# Patient Record
Sex: Female | Born: 1970 | Race: Black or African American | Hispanic: No | Marital: Single | Smoking: Never smoker
Health system: Southern US, Community
[De-identification: ages and names within clinical notes are randomized; demographics above are authoritative.]

## PROBLEM LIST (undated history)

## (undated) ENCOUNTER — Emergency Department (HOSPITAL_COMMUNITY): Payer: Medicare Other | Source: Home / Self Care

## (undated) DIAGNOSIS — K219 Gastro-esophageal reflux disease without esophagitis: Secondary | ICD-10-CM

## (undated) DIAGNOSIS — Z992 Dependence on renal dialysis: Secondary | ICD-10-CM

## (undated) DIAGNOSIS — Z9483 Pancreas transplant status: Secondary | ICD-10-CM

## (undated) DIAGNOSIS — N2581 Secondary hyperparathyroidism of renal origin: Secondary | ICD-10-CM

## (undated) DIAGNOSIS — I10 Essential (primary) hypertension: Secondary | ICD-10-CM

## (undated) DIAGNOSIS — I5033 Acute on chronic diastolic (congestive) heart failure: Secondary | ICD-10-CM

## (undated) DIAGNOSIS — E669 Obesity, unspecified: Secondary | ICD-10-CM

## (undated) DIAGNOSIS — S0501XA Injury of conjunctiva and corneal abrasion without foreign body, right eye, initial encounter: Secondary | ICD-10-CM

## (undated) DIAGNOSIS — N186 End stage renal disease: Secondary | ICD-10-CM

## (undated) DIAGNOSIS — L0390 Cellulitis, unspecified: Secondary | ICD-10-CM

## (undated) DIAGNOSIS — J439 Emphysema, unspecified: Secondary | ICD-10-CM

## (undated) DIAGNOSIS — F329 Major depressive disorder, single episode, unspecified: Secondary | ICD-10-CM

## (undated) DIAGNOSIS — E1110 Type 2 diabetes mellitus with ketoacidosis without coma: Secondary | ICD-10-CM

## (undated) DIAGNOSIS — D649 Anemia, unspecified: Secondary | ICD-10-CM

## (undated) DIAGNOSIS — Z5189 Encounter for other specified aftercare: Secondary | ICD-10-CM

## (undated) DIAGNOSIS — I209 Angina pectoris, unspecified: Secondary | ICD-10-CM

## (undated) DIAGNOSIS — Z940 Kidney transplant status: Secondary | ICD-10-CM

## (undated) DIAGNOSIS — J45909 Unspecified asthma, uncomplicated: Secondary | ICD-10-CM

## (undated) DIAGNOSIS — E109 Type 1 diabetes mellitus without complications: Secondary | ICD-10-CM

## (undated) DIAGNOSIS — F32A Depression, unspecified: Secondary | ICD-10-CM

## (undated) DIAGNOSIS — G2581 Restless legs syndrome: Secondary | ICD-10-CM

## (undated) DIAGNOSIS — E785 Hyperlipidemia, unspecified: Secondary | ICD-10-CM

## (undated) DIAGNOSIS — E611 Iron deficiency: Secondary | ICD-10-CM

## (undated) DIAGNOSIS — I639 Cerebral infarction, unspecified: Secondary | ICD-10-CM

## (undated) DIAGNOSIS — J40 Bronchitis, not specified as acute or chronic: Secondary | ICD-10-CM

## (undated) DIAGNOSIS — G43909 Migraine, unspecified, not intractable, without status migrainosus: Secondary | ICD-10-CM

## (undated) DIAGNOSIS — J189 Pneumonia, unspecified organism: Secondary | ICD-10-CM

## (undated) DIAGNOSIS — R109 Unspecified abdominal pain: Secondary | ICD-10-CM

## (undated) HISTORY — DX: Cellulitis, unspecified: L03.90

## (undated) HISTORY — DX: Hyperlipidemia, unspecified: E78.5

## (undated) HISTORY — DX: Gastro-esophageal reflux disease without esophagitis: K21.9

## (undated) HISTORY — DX: Injury of conjunctiva and corneal abrasion without foreign body, right eye, initial encounter: S05.01XA

## (undated) HISTORY — DX: Obesity, unspecified: E66.9

## (undated) HISTORY — DX: Emphysema, unspecified: J43.9

## (undated) HISTORY — PX: EYE SURGERY: SHX253

## (undated) HISTORY — DX: End stage renal disease: N18.6

## (undated) HISTORY — PX: RETINOPATHY SURGERY: SHX765

## (undated) HISTORY — PX: OTHER SURGICAL HISTORY: SHX169

## (undated) HISTORY — DX: Iron deficiency: E61.1

## (undated) HISTORY — DX: Secondary hyperparathyroidism of renal origin: N25.81

## (undated) HISTORY — DX: Restless legs syndrome: G25.81

---

## 1993-05-28 HISTORY — PX: CHOLECYSTECTOMY: SHX55

## 2000-07-30 ENCOUNTER — Ambulatory Visit (HOSPITAL_COMMUNITY): Admission: RE | Admit: 2000-07-30 | Discharge: 2000-07-31 | Payer: Self-pay | Admitting: Ophthalmology

## 2000-07-30 ENCOUNTER — Encounter: Payer: Self-pay | Admitting: Ophthalmology

## 2000-08-01 ENCOUNTER — Emergency Department (HOSPITAL_COMMUNITY): Admission: EM | Admit: 2000-08-01 | Discharge: 2000-08-02 | Payer: Self-pay | Admitting: Emergency Medicine

## 2000-08-13 ENCOUNTER — Encounter: Payer: Self-pay | Admitting: Nephrology

## 2000-08-13 ENCOUNTER — Encounter: Admission: RE | Admit: 2000-08-13 | Discharge: 2000-08-13 | Payer: Self-pay | Admitting: Nephrology

## 2001-01-13 ENCOUNTER — Emergency Department (HOSPITAL_COMMUNITY): Admission: EM | Admit: 2001-01-13 | Discharge: 2001-01-14 | Payer: Self-pay | Admitting: Emergency Medicine

## 2001-08-19 ENCOUNTER — Encounter: Admission: RE | Admit: 2001-08-19 | Discharge: 2001-11-17 | Payer: Self-pay | Admitting: Nephrology

## 2001-10-27 ENCOUNTER — Emergency Department (HOSPITAL_COMMUNITY): Admission: EM | Admit: 2001-10-27 | Discharge: 2001-10-27 | Payer: Self-pay | Admitting: Emergency Medicine

## 2001-11-25 ENCOUNTER — Encounter (HOSPITAL_COMMUNITY): Admission: RE | Admit: 2001-11-25 | Discharge: 2002-02-23 | Payer: Self-pay | Admitting: Nephrology

## 2001-12-20 ENCOUNTER — Emergency Department (HOSPITAL_COMMUNITY): Admission: EM | Admit: 2001-12-20 | Discharge: 2001-12-20 | Payer: Self-pay | Admitting: Emergency Medicine

## 2001-12-20 ENCOUNTER — Encounter: Payer: Self-pay | Admitting: Emergency Medicine

## 2002-01-11 ENCOUNTER — Emergency Department (HOSPITAL_COMMUNITY): Admission: EM | Admit: 2002-01-11 | Discharge: 2002-01-11 | Payer: Self-pay | Admitting: Emergency Medicine

## 2002-01-19 ENCOUNTER — Emergency Department (HOSPITAL_COMMUNITY): Admission: EM | Admit: 2002-01-19 | Discharge: 2002-01-20 | Payer: Self-pay | Admitting: Emergency Medicine

## 2002-03-18 ENCOUNTER — Encounter (HOSPITAL_COMMUNITY): Admission: RE | Admit: 2002-03-18 | Discharge: 2002-06-16 | Payer: Self-pay | Admitting: Nephrology

## 2002-04-05 ENCOUNTER — Emergency Department (HOSPITAL_COMMUNITY): Admission: EM | Admit: 2002-04-05 | Discharge: 2002-04-05 | Payer: Self-pay | Admitting: Emergency Medicine

## 2002-05-28 DIAGNOSIS — IMO0001 Reserved for inherently not codable concepts without codable children: Secondary | ICD-10-CM

## 2002-05-28 DIAGNOSIS — Z5189 Encounter for other specified aftercare: Secondary | ICD-10-CM

## 2002-05-28 HISTORY — DX: Reserved for inherently not codable concepts without codable children: IMO0001

## 2002-05-28 HISTORY — DX: Encounter for other specified aftercare: Z51.89

## 2002-08-16 HISTORY — PX: COMBINED KIDNEY-PANCREAS TRANSPLANT: SHX1382

## 2002-10-20 ENCOUNTER — Encounter: Admission: RE | Admit: 2002-10-20 | Discharge: 2002-11-10 | Payer: Self-pay | Admitting: Transplant Surgery

## 2002-12-01 ENCOUNTER — Encounter: Admission: RE | Admit: 2002-12-01 | Discharge: 2003-01-01 | Payer: Self-pay | Admitting: Transplant Surgery

## 2003-03-08 ENCOUNTER — Emergency Department (HOSPITAL_COMMUNITY): Admission: EM | Admit: 2003-03-08 | Discharge: 2003-03-08 | Payer: Self-pay | Admitting: Emergency Medicine

## 2003-06-02 ENCOUNTER — Emergency Department (HOSPITAL_COMMUNITY): Admission: AD | Admit: 2003-06-02 | Discharge: 2003-06-02 | Payer: Self-pay | Admitting: Family Medicine

## 2003-06-30 ENCOUNTER — Emergency Department (HOSPITAL_COMMUNITY): Admission: AD | Admit: 2003-06-30 | Discharge: 2003-06-30 | Payer: Self-pay | Admitting: Family Medicine

## 2003-09-01 ENCOUNTER — Emergency Department (HOSPITAL_COMMUNITY): Admission: EM | Admit: 2003-09-01 | Discharge: 2003-09-01 | Payer: Self-pay | Admitting: Family Medicine

## 2003-09-14 ENCOUNTER — Emergency Department (HOSPITAL_COMMUNITY): Admission: EM | Admit: 2003-09-14 | Discharge: 2003-09-14 | Payer: Self-pay | Admitting: Family Medicine

## 2003-10-06 ENCOUNTER — Emergency Department (HOSPITAL_COMMUNITY): Admission: EM | Admit: 2003-10-06 | Discharge: 2003-10-06 | Payer: Self-pay | Admitting: Family Medicine

## 2003-11-28 ENCOUNTER — Emergency Department (HOSPITAL_COMMUNITY): Admission: EM | Admit: 2003-11-28 | Discharge: 2003-11-28 | Payer: Self-pay | Admitting: Family Medicine

## 2003-11-30 ENCOUNTER — Emergency Department (HOSPITAL_COMMUNITY): Admission: EM | Admit: 2003-11-30 | Discharge: 2003-11-30 | Payer: Self-pay | Admitting: Emergency Medicine

## 2004-01-20 ENCOUNTER — Encounter: Admission: RE | Admit: 2004-01-20 | Discharge: 2004-01-20 | Payer: Self-pay | Admitting: Nephrology

## 2004-03-22 ENCOUNTER — Emergency Department (HOSPITAL_COMMUNITY): Admission: EM | Admit: 2004-03-22 | Discharge: 2004-03-22 | Payer: Self-pay | Admitting: Family Medicine

## 2004-08-23 ENCOUNTER — Emergency Department (HOSPITAL_COMMUNITY): Admission: EM | Admit: 2004-08-23 | Discharge: 2004-08-23 | Payer: Self-pay | Admitting: Family Medicine

## 2004-08-24 ENCOUNTER — Emergency Department (HOSPITAL_COMMUNITY): Admission: EM | Admit: 2004-08-24 | Discharge: 2004-08-25 | Payer: Self-pay | Admitting: Emergency Medicine

## 2004-12-05 ENCOUNTER — Emergency Department (HOSPITAL_COMMUNITY): Admission: EM | Admit: 2004-12-05 | Discharge: 2004-12-05 | Payer: Self-pay | Admitting: Family Medicine

## 2004-12-20 ENCOUNTER — Emergency Department (HOSPITAL_COMMUNITY): Admission: EM | Admit: 2004-12-20 | Discharge: 2004-12-20 | Payer: Self-pay | Admitting: Family Medicine

## 2005-04-09 ENCOUNTER — Emergency Department (HOSPITAL_COMMUNITY): Admission: EM | Admit: 2005-04-09 | Discharge: 2005-04-09 | Payer: Self-pay | Admitting: Family Medicine

## 2005-05-16 ENCOUNTER — Emergency Department (HOSPITAL_COMMUNITY): Admission: EM | Admit: 2005-05-16 | Discharge: 2005-05-16 | Payer: Self-pay | Admitting: Emergency Medicine

## 2005-06-05 ENCOUNTER — Emergency Department (HOSPITAL_COMMUNITY): Admission: EM | Admit: 2005-06-05 | Discharge: 2005-06-05 | Payer: Self-pay | Admitting: Emergency Medicine

## 2005-06-14 ENCOUNTER — Inpatient Hospital Stay (HOSPITAL_COMMUNITY): Admission: AD | Admit: 2005-06-14 | Discharge: 2005-06-15 | Payer: Self-pay | Admitting: Nephrology

## 2005-07-25 ENCOUNTER — Emergency Department (HOSPITAL_COMMUNITY): Admission: EM | Admit: 2005-07-25 | Discharge: 2005-07-25 | Payer: Self-pay | Admitting: Emergency Medicine

## 2005-07-25 ENCOUNTER — Emergency Department (HOSPITAL_COMMUNITY): Admission: EM | Admit: 2005-07-25 | Discharge: 2005-07-25 | Payer: Self-pay | Admitting: *Deleted

## 2006-01-18 ENCOUNTER — Encounter: Admission: RE | Admit: 2006-01-18 | Discharge: 2006-01-18 | Payer: Self-pay | Admitting: Obstetrics

## 2006-02-10 IMAGING — CR DG LUMBAR SPINE COMPLETE 4+V
5 series · 5 of 5 positions shown · non-contrast
Comparison: none

CLINICAL DATA: 33 year old with back pain.
 FIVE VIEW LUMBAR SPINE SERIES
 The lateral film demonstrates normal overall alignment.  Disk spaces and vertebral bodies are maintained.  No acute bony findings.  Oblique films demonstrate no significant facet degenerative changes and no pars defects.  
 IMPRESSION
 1.  No acute bony findings or significant degenerative changes in the lumbar spine.

[view not recorded (1 of 5)]
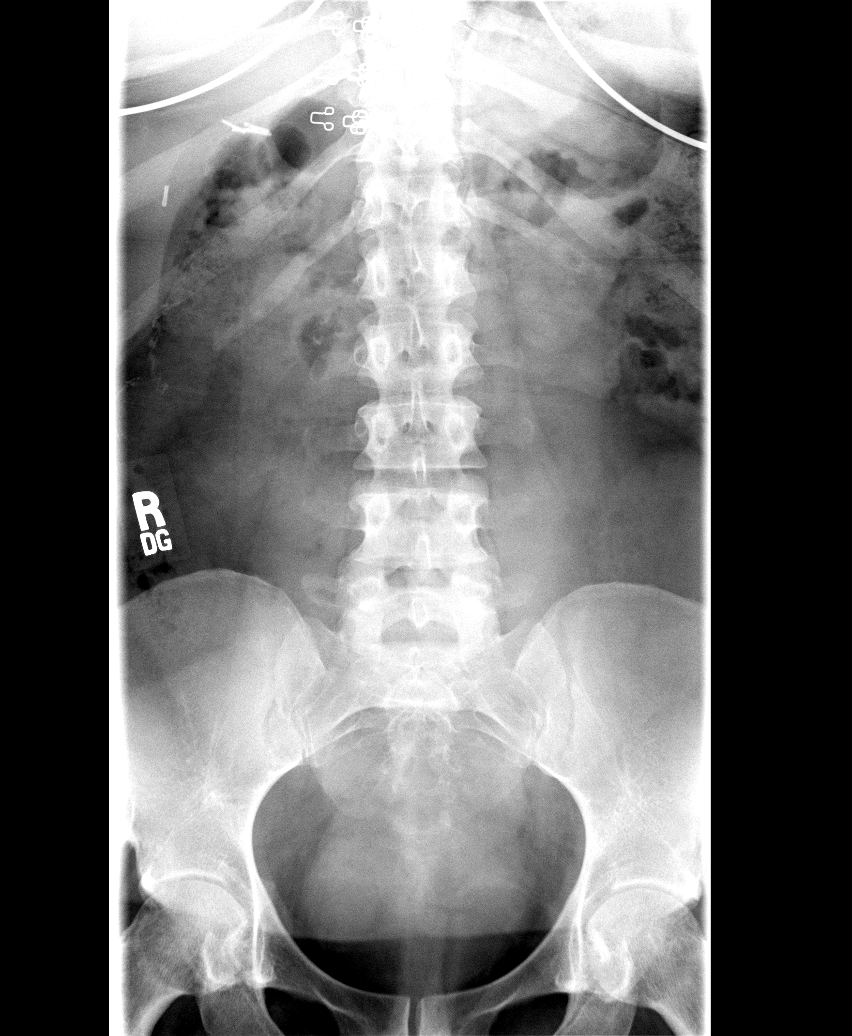

[view not recorded (2 of 5)]
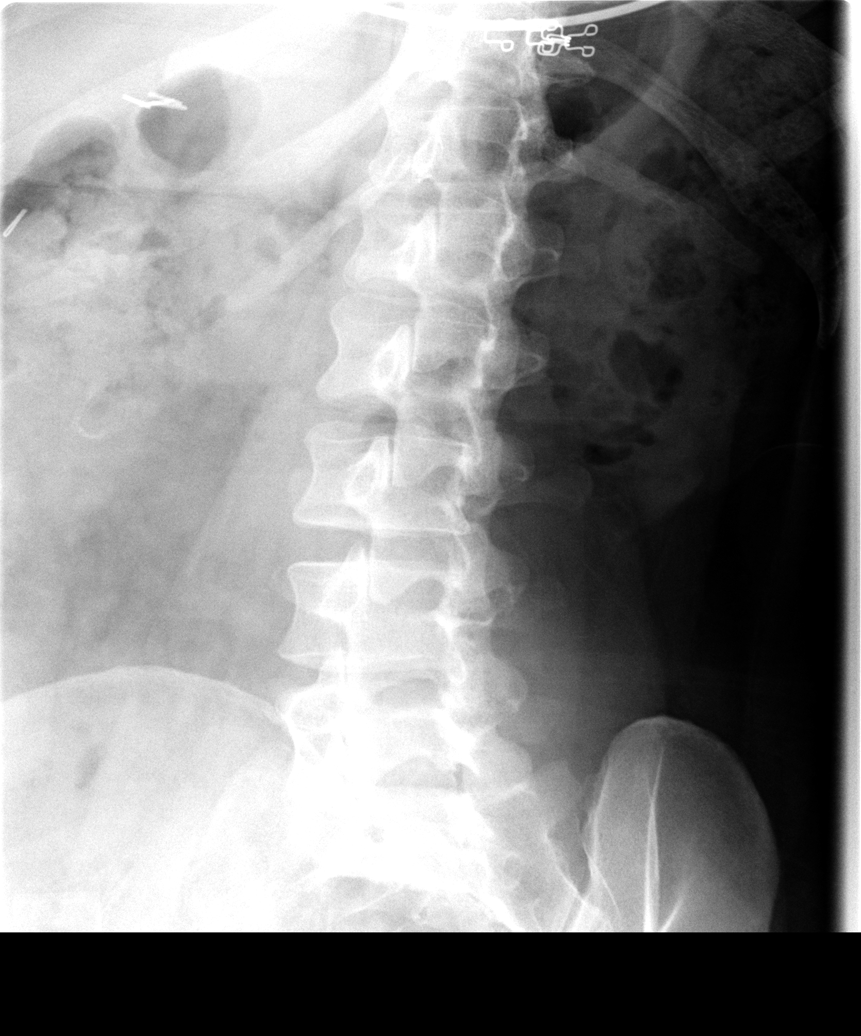

[view not recorded (3 of 5)]
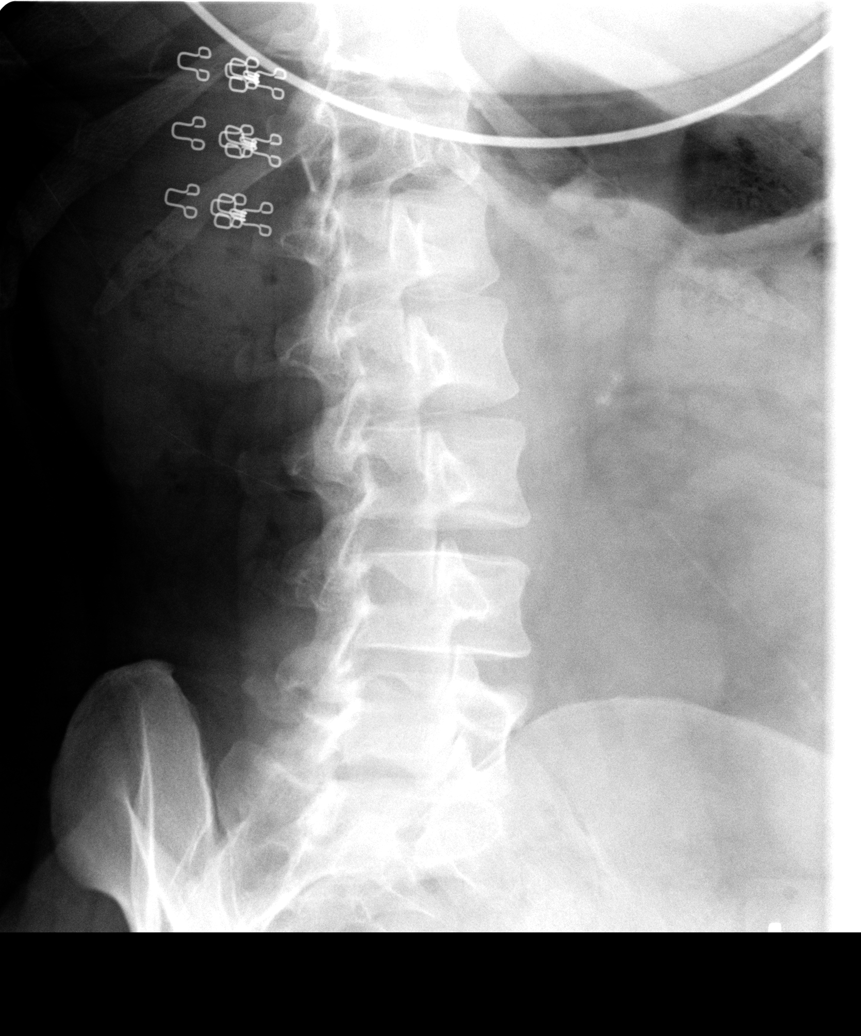

[view not recorded (4 of 5)]
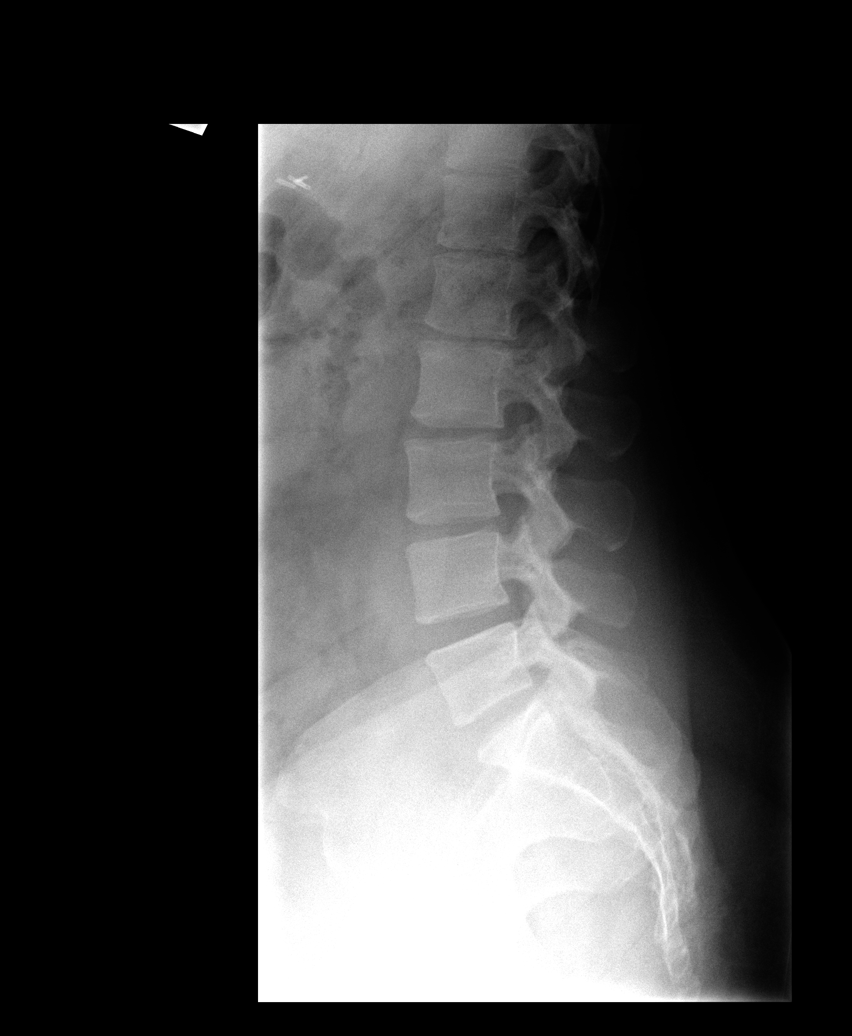

[view not recorded (5 of 5)]
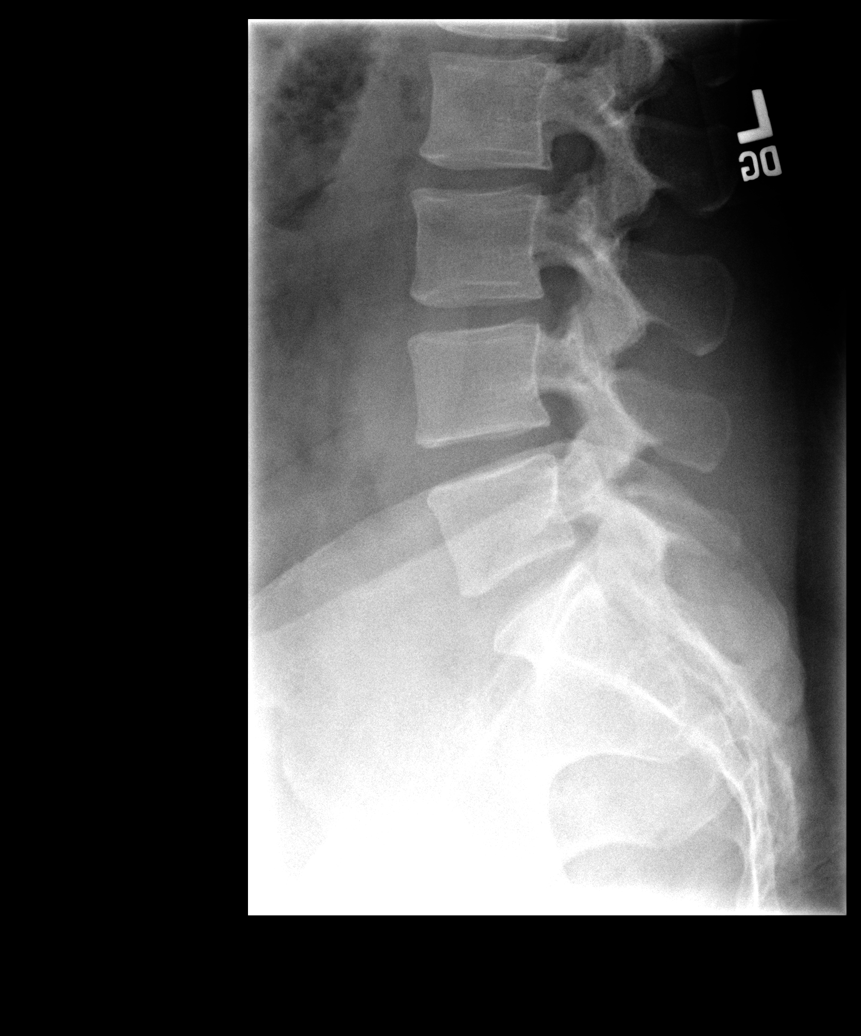

[5 of 5 positions shown; findings below may reference images not displayed]

## 2006-07-16 ENCOUNTER — Encounter: Admission: RE | Admit: 2006-07-16 | Discharge: 2006-07-16 | Payer: Self-pay | Admitting: Obstetrics

## 2006-08-19 ENCOUNTER — Emergency Department (HOSPITAL_COMMUNITY): Admission: EM | Admit: 2006-08-19 | Discharge: 2006-08-19 | Payer: Self-pay | Admitting: Family Medicine

## 2006-12-26 ENCOUNTER — Ambulatory Visit (HOSPITAL_COMMUNITY): Admission: RE | Admit: 2006-12-26 | Discharge: 2006-12-26 | Payer: Self-pay | Admitting: Nephrology

## 2006-12-31 ENCOUNTER — Ambulatory Visit: Payer: Self-pay | Admitting: Vascular Surgery

## 2007-01-16 ENCOUNTER — Ambulatory Visit (HOSPITAL_COMMUNITY): Admission: RE | Admit: 2007-01-16 | Discharge: 2007-01-16 | Payer: Self-pay | Admitting: Vascular Surgery

## 2007-01-16 ENCOUNTER — Ambulatory Visit: Payer: Self-pay | Admitting: Vascular Surgery

## 2007-03-04 ENCOUNTER — Ambulatory Visit (HOSPITAL_COMMUNITY): Admission: RE | Admit: 2007-03-04 | Discharge: 2007-03-04 | Payer: Self-pay | Admitting: Vascular Surgery

## 2007-03-04 ENCOUNTER — Ambulatory Visit: Payer: Self-pay | Admitting: Surgery

## 2007-06-13 ENCOUNTER — Emergency Department (HOSPITAL_COMMUNITY): Admission: EM | Admit: 2007-06-13 | Discharge: 2007-06-13 | Payer: Self-pay | Admitting: Emergency Medicine

## 2007-07-09 ENCOUNTER — Emergency Department (HOSPITAL_COMMUNITY): Admission: EM | Admit: 2007-07-09 | Discharge: 2007-07-09 | Payer: Self-pay | Admitting: Emergency Medicine

## 2007-07-29 ENCOUNTER — Emergency Department (HOSPITAL_COMMUNITY): Admission: EM | Admit: 2007-07-29 | Discharge: 2007-07-29 | Payer: Self-pay | Admitting: Emergency Medicine
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
## 2008-03-18 ENCOUNTER — Inpatient Hospital Stay (HOSPITAL_COMMUNITY): Admission: EM | Admit: 2008-03-18 | Discharge: 2008-03-20 | Payer: Self-pay | Admitting: Emergency Medicine

## 2008-05-09 ENCOUNTER — Emergency Department (HOSPITAL_COMMUNITY): Admission: EM | Admit: 2008-05-09 | Discharge: 2008-05-09 | Payer: Self-pay | Admitting: Family Medicine

## 2008-05-17 ENCOUNTER — Inpatient Hospital Stay (HOSPITAL_COMMUNITY): Admission: EM | Admit: 2008-05-17 | Discharge: 2008-05-19 | Payer: Self-pay | Admitting: Emergency Medicine

## 2008-06-22 ENCOUNTER — Emergency Department (HOSPITAL_COMMUNITY): Admission: EM | Admit: 2008-06-22 | Discharge: 2008-06-22 | Payer: Self-pay | Admitting: Emergency Medicine

## 2008-07-16 ENCOUNTER — Ambulatory Visit: Payer: Self-pay | Admitting: Emergency Medicine

## 2008-07-16 ENCOUNTER — Inpatient Hospital Stay (HOSPITAL_COMMUNITY): Admission: EM | Admit: 2008-07-16 | Discharge: 2008-07-23 | Payer: Self-pay | Admitting: Emergency Medicine

## 2008-09-07 ENCOUNTER — Ambulatory Visit: Payer: Self-pay | Admitting: Vascular Surgery

## 2008-09-10 ENCOUNTER — Ambulatory Visit (HOSPITAL_COMMUNITY): Admission: RE | Admit: 2008-09-10 | Discharge: 2008-09-10 | Payer: Self-pay | Admitting: Vascular Surgery

## 2008-09-10 ENCOUNTER — Ambulatory Visit: Payer: Self-pay | Admitting: Vascular Surgery

## 2008-10-14 ENCOUNTER — Ambulatory Visit: Payer: Self-pay | Admitting: *Deleted

## 2008-10-25 ENCOUNTER — Emergency Department (HOSPITAL_COMMUNITY): Admission: EM | Admit: 2008-10-25 | Discharge: 2008-10-25 | Payer: Self-pay | Admitting: Emergency Medicine

## 2008-11-15 ENCOUNTER — Encounter: Payer: Self-pay | Admitting: Pulmonary Disease

## 2008-11-23 ENCOUNTER — Ambulatory Visit: Payer: Self-pay | Admitting: Vascular Surgery

## 2008-11-30 ENCOUNTER — Emergency Department (HOSPITAL_COMMUNITY): Admission: EM | Admit: 2008-11-30 | Discharge: 2008-11-30 | Payer: Self-pay | Admitting: Emergency Medicine

## 2008-12-01 ENCOUNTER — Emergency Department (HOSPITAL_COMMUNITY): Admission: EM | Admit: 2008-12-01 | Discharge: 2008-12-01 | Payer: Self-pay | Admitting: Emergency Medicine

## 2008-12-10 ENCOUNTER — Ambulatory Visit: Payer: Self-pay | Admitting: Vascular Surgery

## 2008-12-10 ENCOUNTER — Ambulatory Visit (HOSPITAL_COMMUNITY): Admission: RE | Admit: 2008-12-10 | Discharge: 2008-12-10 | Payer: Self-pay | Admitting: Vascular Surgery

## 2008-12-17 ENCOUNTER — Ambulatory Visit (HOSPITAL_COMMUNITY): Admission: RE | Admit: 2008-12-17 | Discharge: 2008-12-17 | Payer: Self-pay | Admitting: Nephrology

## 2008-12-29 ENCOUNTER — Ambulatory Visit (HOSPITAL_COMMUNITY): Admission: RE | Admit: 2008-12-29 | Discharge: 2008-12-29 | Payer: Self-pay | Admitting: Vascular Surgery

## 2009-01-06 ENCOUNTER — Ambulatory Visit (HOSPITAL_COMMUNITY): Admission: RE | Admit: 2009-01-06 | Discharge: 2009-01-06 | Payer: Self-pay | Admitting: Surgery

## 2009-01-12 ENCOUNTER — Ambulatory Visit (HOSPITAL_COMMUNITY): Admission: RE | Admit: 2009-01-12 | Discharge: 2009-01-12 | Payer: Self-pay | Admitting: Vascular Surgery

## 2009-01-12 ENCOUNTER — Ambulatory Visit: Payer: Self-pay | Admitting: Vascular Surgery

## 2009-01-16 IMAGING — XA IR FLUORO GUIDE CV LINE*L*
1 series · 2 of 2 positions shown · non-contrast
Comparison: none

CLINICAL DATA: 36 year old female, bacteremia, existing left dialysis catheter, end-stage renal disease, chronic catheter use.
1.  REMOVAL OF LEFT IJ DIALYSIS CATHETER:
2.  ULTRASOUND GUIDANCE FOR VASCULAR ACCESS:
3.  RIGHT IJ TUNNELED HEMODIALYSIS CATHETER INSERTION:
Radiologist:  Gone, Dax.   
Guidance:  Ultrasound and fluoroscopic. 
Complications:  No immediate complications.   
Medications: 1 gm Ancef for intravenous antibiotics, Versed and fentanyl for conscious sedation.
Procedure/Findings:   Informed consent was obtained from the patient following explanation of the procedure, risks, benefits, and alternatives.  The patient understands, agrees, and consents to the procedure. All questions were addressed. 
LEFT IJ TUNNELED DIALYSIS CATHETER REMOVAL:  Under sterile conditions and local anesthesia, the existing catheter was blunted dissected from the tunnel tract to release the fibrous retension cuff.  The catheter was removed in its entirety.  Hemostasis was obtained with compression.  The catheter tip was sent for culture.  The patient tolerated the procedure well.   There were no immediate complications. A dry sterile dressing was applied.

[Series 1: run · 2 of 2 slices shown]
[im 1/2]
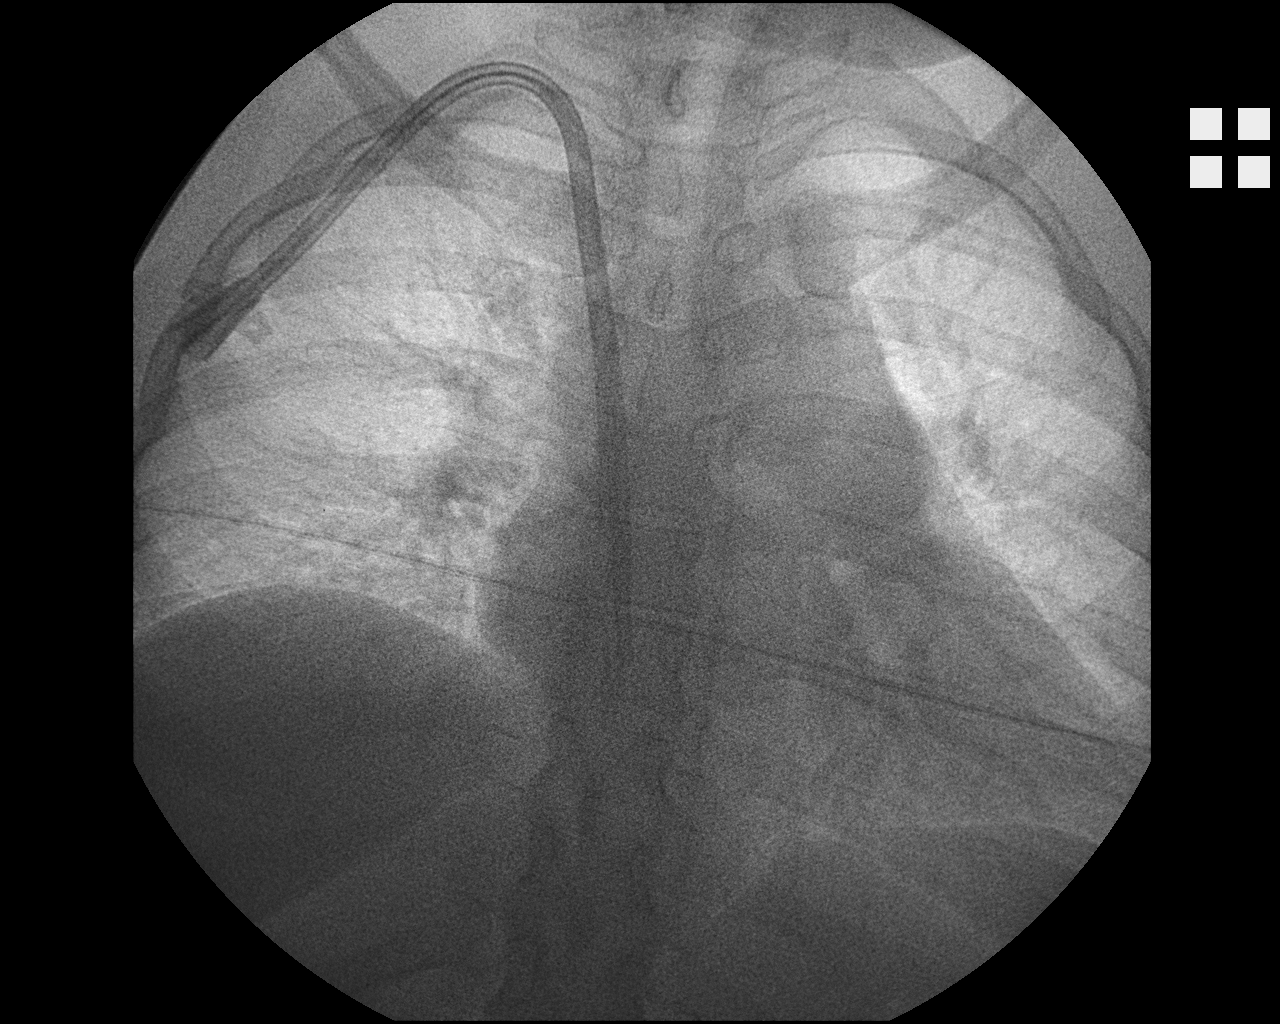
[im 2/2]
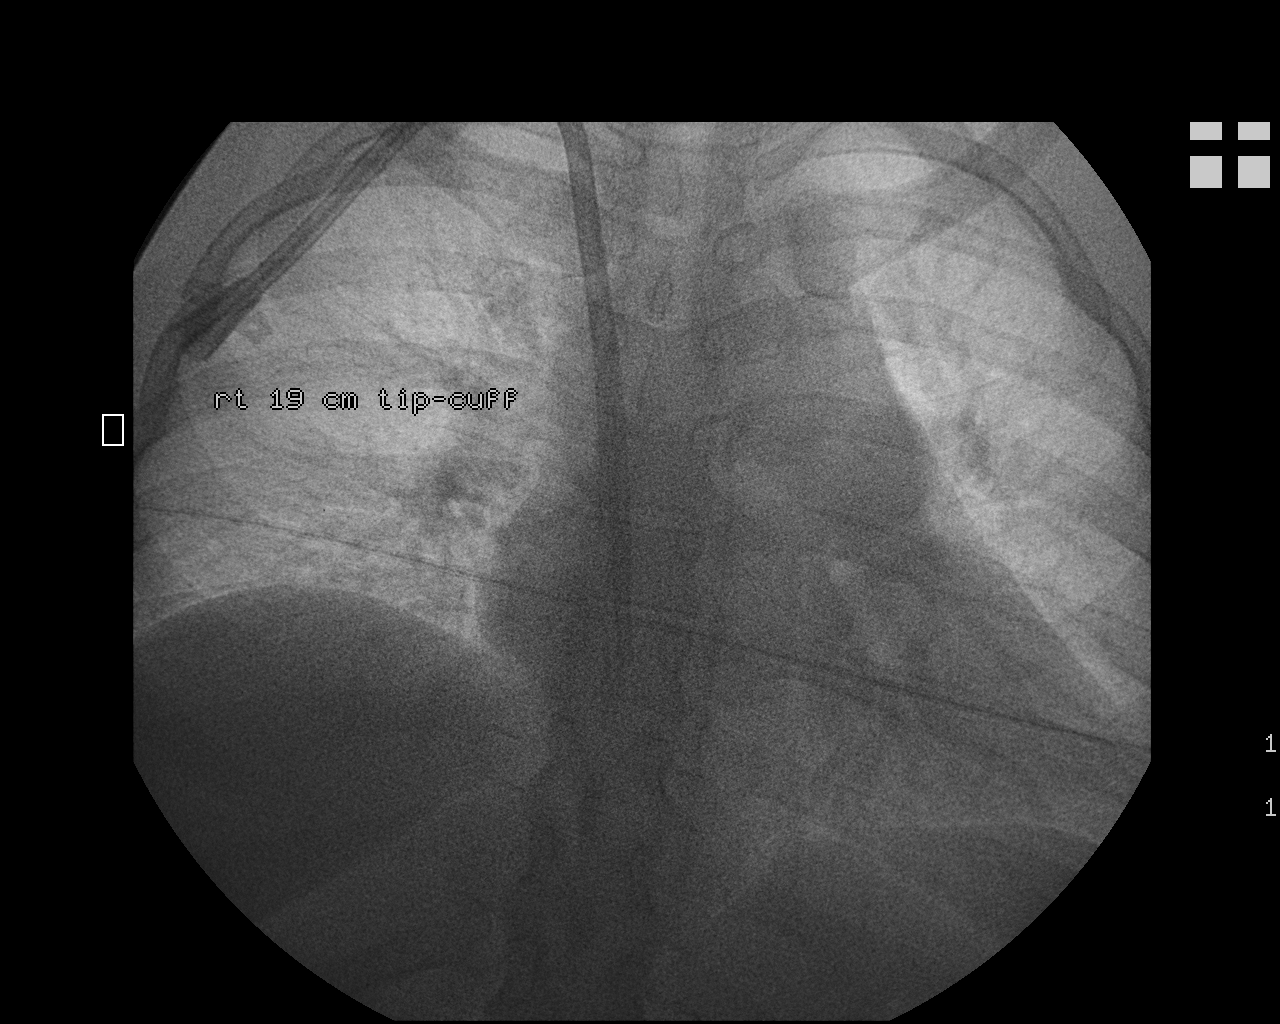

[2 of 2 positions shown; findings below may reference images not displayed]

IMPRESSION: Uncomplicated removal of a left IJ tunneled hemodialysis catheter.
ULTRASOUND GUIDANCE FOR VASCULAR ACCESS AND RIGHT IJ TUNNELED DIALYSIS CATHETER INSERTION:  Under sterile conditions and local anesthesia, right IJ micropuncture venous access was performed.  This was done with ultrasound guidance.  Images were obtained for documentation.  An 018 guidewire was advanced centrally followed by a 4 French dilator. This allowed insertion of a Bentson guidewire into the IVC through a 5 French MPA catheter.  Measurements were obtained from the SVC-RA junction to the right IJ venotomy site.  In the right infraclavicular chest a subcutaneous tunnel was created. This was done under sterile conditions and local anesthesia.  The catheter was tunneled subcutaneously across the chest to the venotomy site and inserted through a valved Peel-Away sheath into the SVC-RA junction.  Images were obtained with fluoroscopy for catheter position.  The tips are in the SVC-RA junction.  The catheter aspirated blood very well followed by saline and heparin flushes.  Caps were applied.  The catheter was secured to the skin with a 0 Prolene suture.   The venotomy site was closed with subcuticular 4-0 Vicryl suture and Dermabond.  A dry sterile dressing was applied.
IMPRESSION: Ultrasound and fluoroscopically guided right IJ tunneled hemodialysis catheter insertion with the tips in the SVC-RA junction ready for use (19 cm tip-to-cuff HemoSplit catheter).

## 2009-01-28 ENCOUNTER — Ambulatory Visit: Payer: Self-pay | Admitting: Vascular Surgery

## 2009-02-18 ENCOUNTER — Emergency Department (HOSPITAL_COMMUNITY): Admission: EM | Admit: 2009-02-18 | Discharge: 2009-02-18 | Payer: Self-pay | Admitting: Family Medicine

## 2009-03-11 ENCOUNTER — Ambulatory Visit (HOSPITAL_COMMUNITY): Admission: RE | Admit: 2009-03-11 | Discharge: 2009-03-11 | Payer: Self-pay | Admitting: Vascular Surgery

## 2009-03-11 ENCOUNTER — Ambulatory Visit: Payer: Self-pay | Admitting: Vascular Surgery

## 2009-03-24 ENCOUNTER — Emergency Department (HOSPITAL_COMMUNITY): Admission: EM | Admit: 2009-03-24 | Discharge: 2009-03-24 | Payer: Self-pay | Admitting: Emergency Medicine

## 2009-04-11 ENCOUNTER — Encounter (INDEPENDENT_AMBULATORY_CARE_PROVIDER_SITE_OTHER): Payer: Self-pay | Admitting: *Deleted

## 2009-04-25 ENCOUNTER — Telehealth (INDEPENDENT_AMBULATORY_CARE_PROVIDER_SITE_OTHER): Payer: Self-pay | Admitting: *Deleted

## 2009-04-28 ENCOUNTER — Ambulatory Visit: Payer: Self-pay | Admitting: Vascular Surgery

## 2009-05-04 ENCOUNTER — Ambulatory Visit (HOSPITAL_COMMUNITY): Admission: RE | Admit: 2009-05-04 | Discharge: 2009-05-04 | Payer: Self-pay | Admitting: Vascular Surgery

## 2009-05-05 ENCOUNTER — Emergency Department (HOSPITAL_COMMUNITY): Admission: EM | Admit: 2009-05-05 | Discharge: 2009-05-05 | Payer: Self-pay | Admitting: Emergency Medicine

## 2009-05-09 ENCOUNTER — Ambulatory Visit (HOSPITAL_COMMUNITY): Admission: RE | Admit: 2009-05-09 | Discharge: 2009-05-09 | Payer: Self-pay | Admitting: Vascular Surgery

## 2009-05-09 ENCOUNTER — Ambulatory Visit: Payer: Self-pay | Admitting: Vascular Surgery

## 2009-05-28 LAB — HM MAMMOGRAPHY

## 2009-07-13 ENCOUNTER — Encounter: Admission: RE | Admit: 2009-07-13 | Discharge: 2009-07-13 | Payer: Self-pay | Admitting: Nephrology

## 2009-07-20 ENCOUNTER — Ambulatory Visit (HOSPITAL_COMMUNITY): Admission: RE | Admit: 2009-07-20 | Discharge: 2009-07-20 | Payer: Self-pay | Admitting: Nephrology

## 2009-07-28 ENCOUNTER — Ambulatory Visit: Payer: Self-pay | Admitting: Vascular Surgery

## 2009-08-03 ENCOUNTER — Ambulatory Visit (HOSPITAL_COMMUNITY): Admission: RE | Admit: 2009-08-03 | Discharge: 2009-08-03 | Payer: Self-pay | Admitting: Vascular Surgery

## 2009-08-03 ENCOUNTER — Ambulatory Visit: Payer: Self-pay | Admitting: Vascular Surgery

## 2009-09-14 ENCOUNTER — Emergency Department (HOSPITAL_COMMUNITY): Admission: EM | Admit: 2009-09-14 | Discharge: 2009-09-14 | Payer: Self-pay | Admitting: Family Medicine

## 2009-09-29 ENCOUNTER — Emergency Department (HOSPITAL_COMMUNITY): Admission: EM | Admit: 2009-09-29 | Discharge: 2009-09-29 | Payer: Self-pay | Admitting: Family Medicine

## 2009-09-30 ENCOUNTER — Ambulatory Visit: Payer: Self-pay | Admitting: Surgery

## 2009-10-03 ENCOUNTER — Emergency Department (HOSPITAL_COMMUNITY): Admission: EM | Admit: 2009-10-03 | Discharge: 2009-10-03 | Payer: Self-pay | Admitting: Emergency Medicine

## 2009-10-20 ENCOUNTER — Emergency Department (HOSPITAL_COMMUNITY): Admission: EM | Admit: 2009-10-20 | Discharge: 2009-10-21 | Payer: Self-pay | Admitting: Emergency Medicine

## 2009-10-27 ENCOUNTER — Ambulatory Visit: Payer: Self-pay | Admitting: Vascular Surgery

## 2009-12-08 ENCOUNTER — Ambulatory Visit: Payer: Self-pay | Admitting: Vascular Surgery

## 2009-12-17 ENCOUNTER — Emergency Department (HOSPITAL_COMMUNITY): Admission: EM | Admit: 2009-12-17 | Discharge: 2009-12-17 | Payer: Self-pay | Admitting: Family Medicine

## 2009-12-22 ENCOUNTER — Ambulatory Visit: Payer: Self-pay | Admitting: Vascular Surgery

## 2009-12-30 ENCOUNTER — Ambulatory Visit: Payer: Self-pay | Admitting: Vascular Surgery

## 2009-12-30 ENCOUNTER — Inpatient Hospital Stay (HOSPITAL_COMMUNITY): Admission: RE | Admit: 2009-12-30 | Discharge: 2010-01-01 | Payer: Self-pay | Admitting: Vascular Surgery

## 2010-01-11 ENCOUNTER — Ambulatory Visit: Payer: Self-pay | Admitting: Vascular Surgery

## 2010-01-28 ENCOUNTER — Inpatient Hospital Stay (HOSPITAL_COMMUNITY): Admission: EM | Admit: 2010-01-28 | Discharge: 2010-01-30 | Payer: Self-pay | Admitting: Emergency Medicine

## 2010-03-01 ENCOUNTER — Ambulatory Visit: Payer: Self-pay | Admitting: Vascular Surgery

## 2010-03-17 ENCOUNTER — Ambulatory Visit (HOSPITAL_COMMUNITY): Admission: RE | Admit: 2010-03-17 | Discharge: 2010-03-17 | Payer: Self-pay | Admitting: Nephrology

## 2010-04-08 IMAGING — CR DG CHEST 2V
2 series · 2 of 2 positions shown · non-contrast
Comparison: 01/16/2007

CLINICAL DATA: Cough, nausea, vomiting, fever

CHEST - 2 VIEW

[w chest pa]
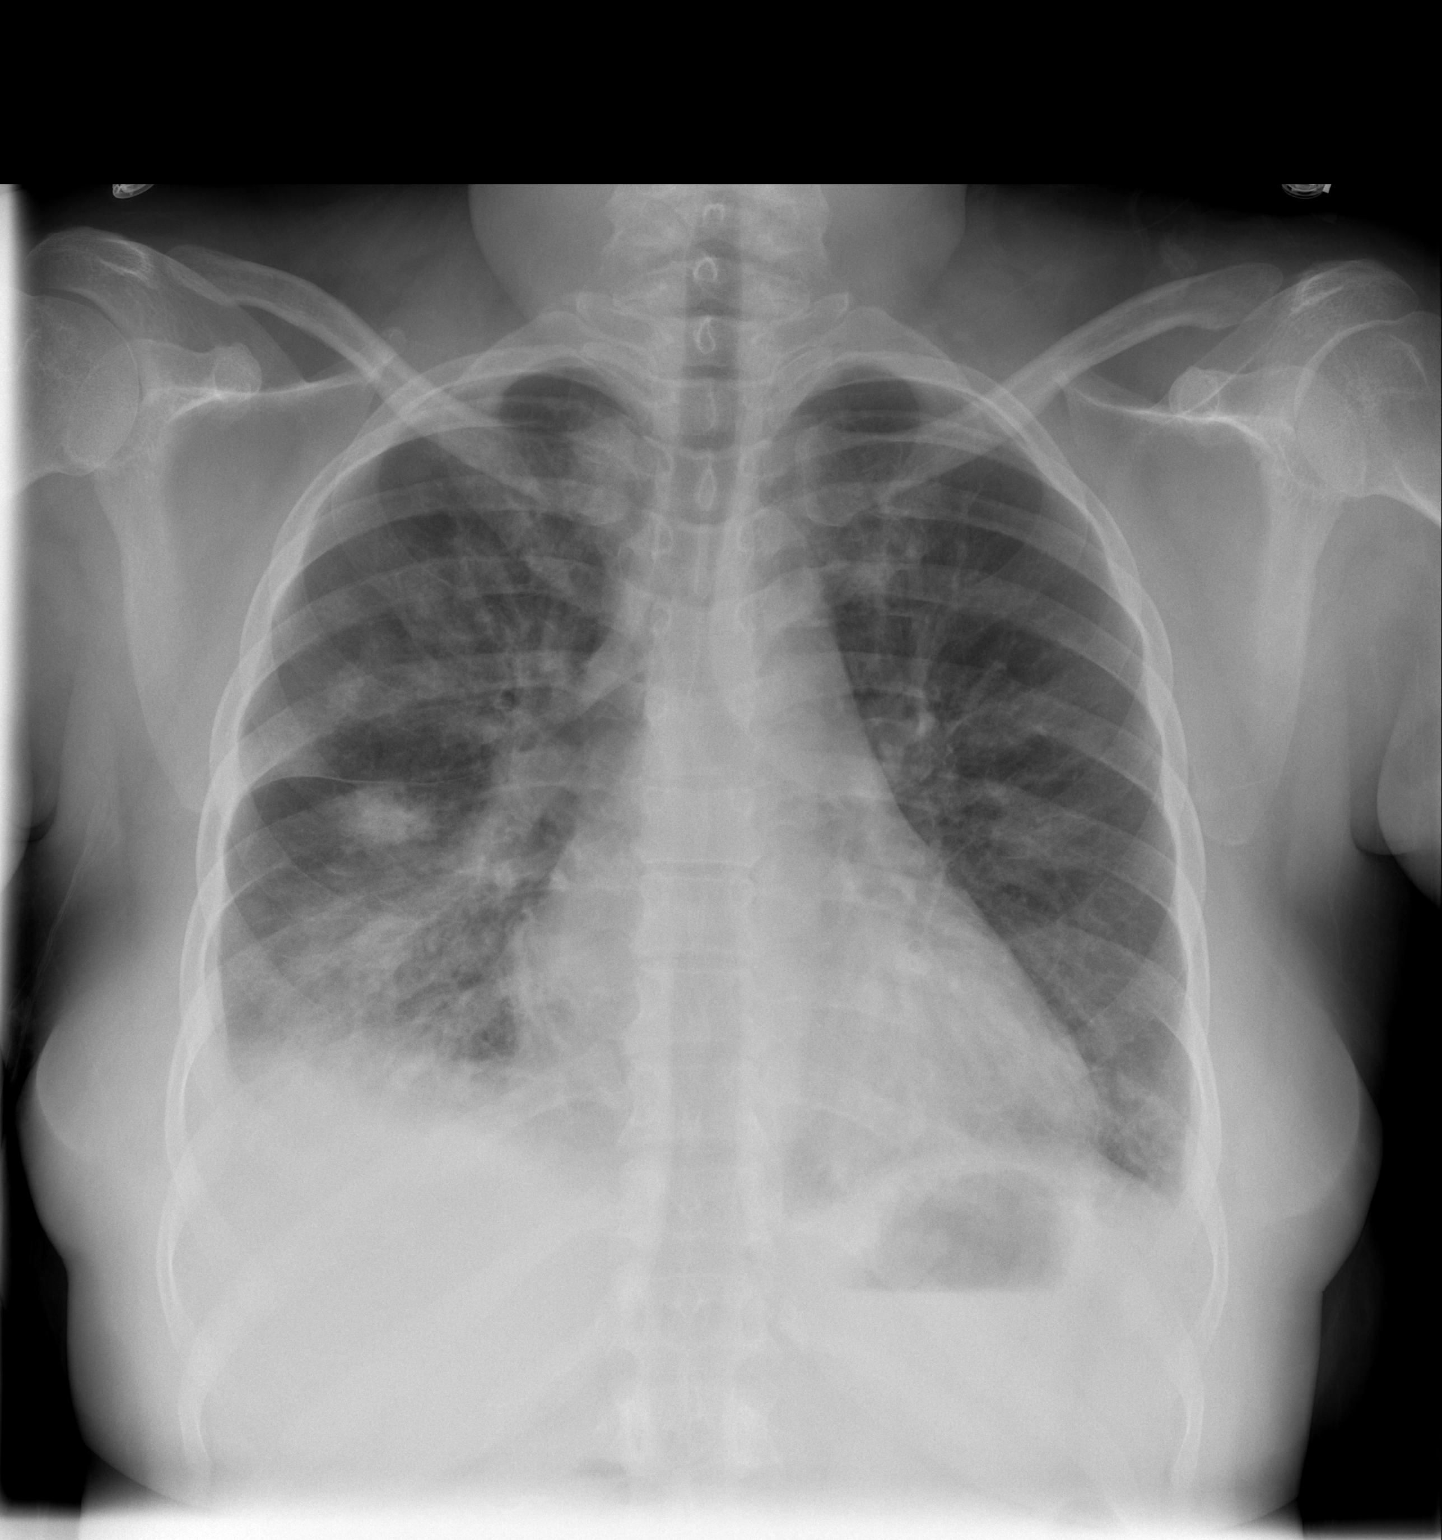

[w chest lat]
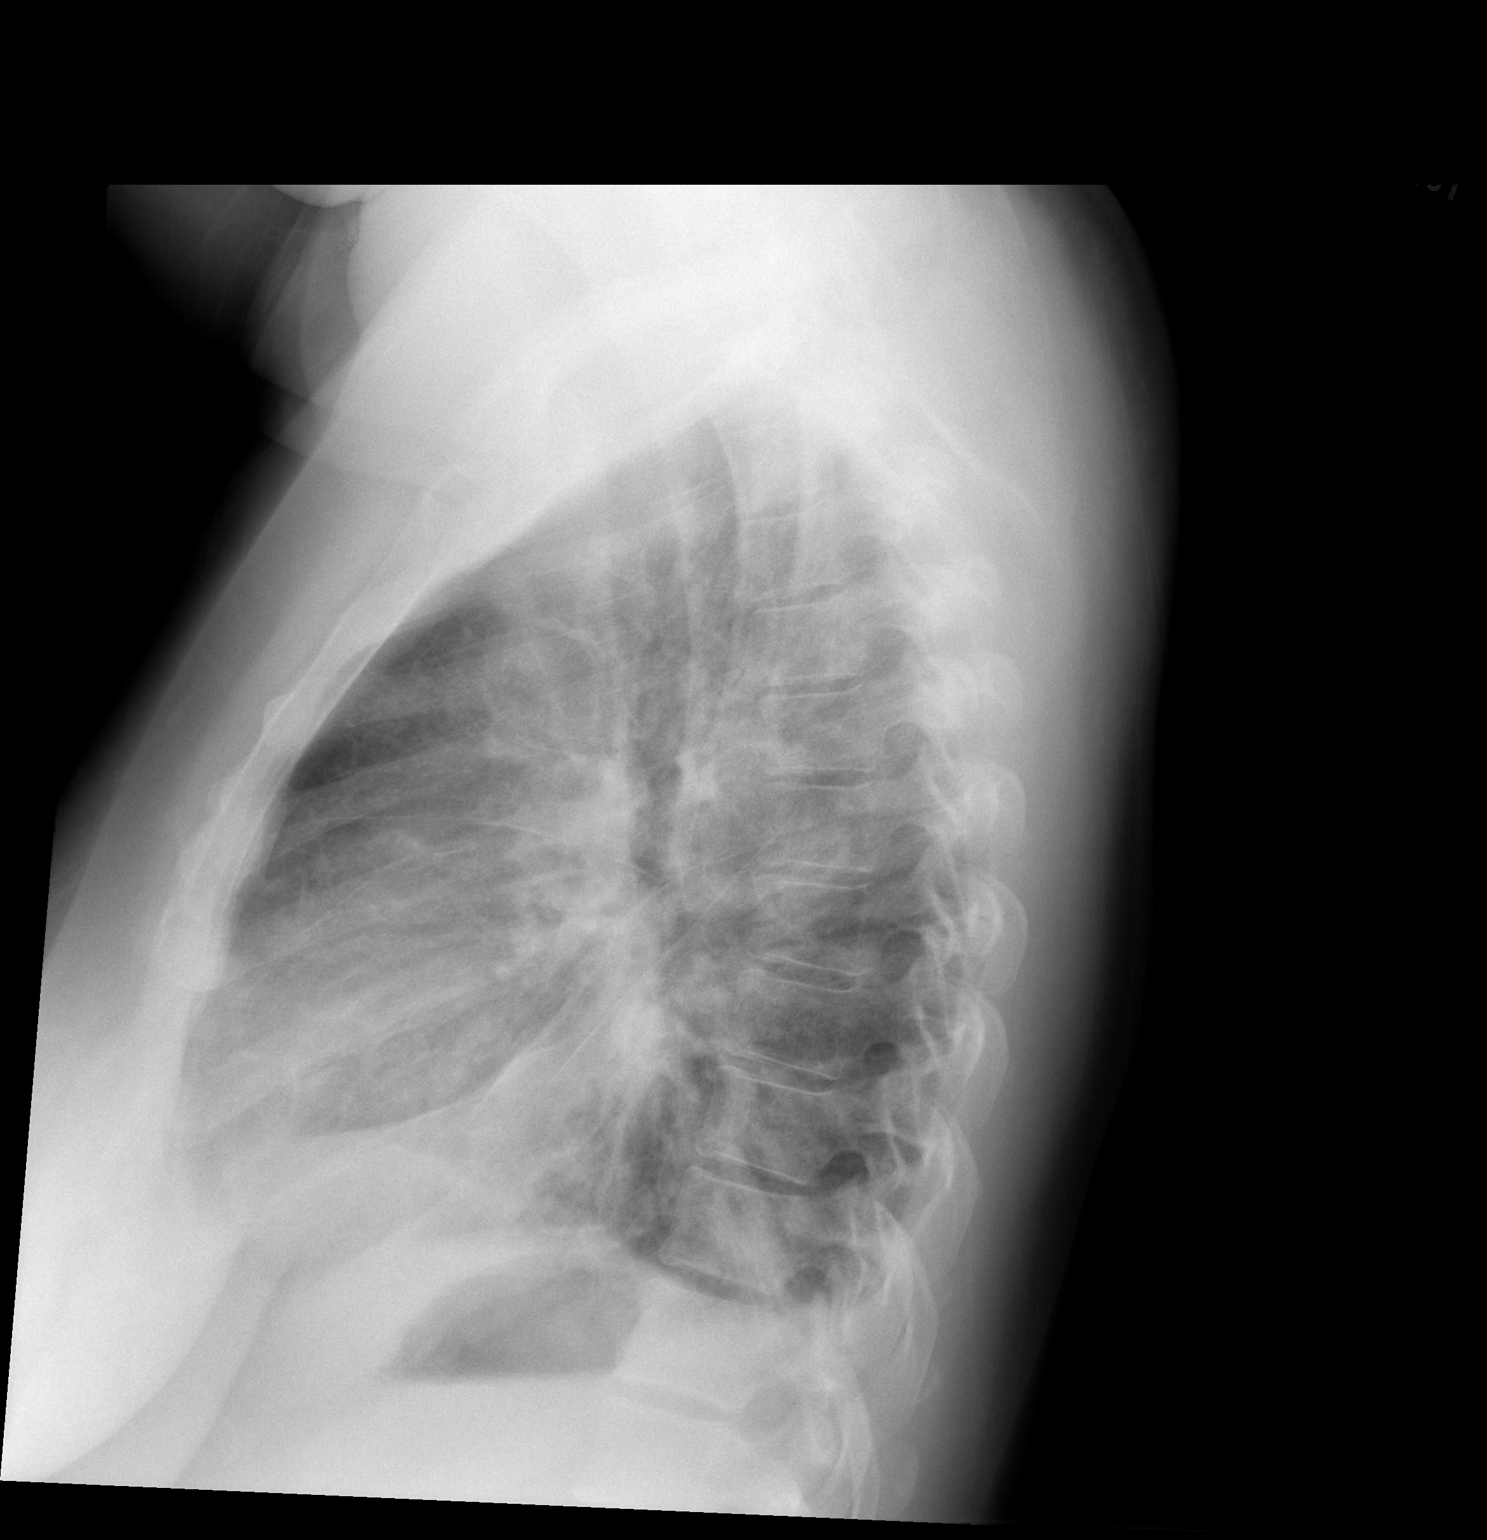

[2 of 2 positions shown; findings below may reference images not displayed]

FINDINGS: Cardiac enlargement.
Normal mediastinal contours.
Slight pulmonary vascular congestion.
Patchy right upper and right lower lobe infiltrates most likely
representing pneumonia.
Minimally increased left perihilar markings.
Small right pleural effusion.
Bones unremarkable.
IMPRESSION: Right lung infiltrates suspicious for pneumonia.

## 2010-04-13 ENCOUNTER — Ambulatory Visit: Payer: Self-pay | Admitting: Vascular Surgery

## 2010-04-26 ENCOUNTER — Ambulatory Visit: Payer: Self-pay | Admitting: Vascular Surgery

## 2010-05-02 ENCOUNTER — Ambulatory Visit (HOSPITAL_COMMUNITY)
Admission: RE | Admit: 2010-05-02 | Discharge: 2010-05-02 | Payer: Self-pay | Source: Home / Self Care | Admitting: Vascular Surgery

## 2010-05-17 ENCOUNTER — Ambulatory Visit: Payer: Self-pay | Admitting: Vascular Surgery

## 2010-05-18 ENCOUNTER — Emergency Department (HOSPITAL_COMMUNITY)
Admission: EM | Admit: 2010-05-18 | Discharge: 2010-05-18 | Payer: Self-pay | Source: Home / Self Care | Admitting: Family Medicine

## 2010-05-28 DIAGNOSIS — J189 Pneumonia, unspecified organism: Secondary | ICD-10-CM

## 2010-05-28 HISTORY — DX: Pneumonia, unspecified organism: J18.9

## 2010-06-08 IMAGING — CR DG CHEST 1V PORT
1 series · 1 of 1 positions shown · non-contrast
Comparison: 05/17/2008 at to [DATE].  Two-view chest x-ray
03/17/2008.

CLINICAL DATA: Shortness of breath.  Status post dialysis.
Evaluate edema.  Possible pneumonia.

PORTABLE CHEST - 1 VIEW

[view not recorded]
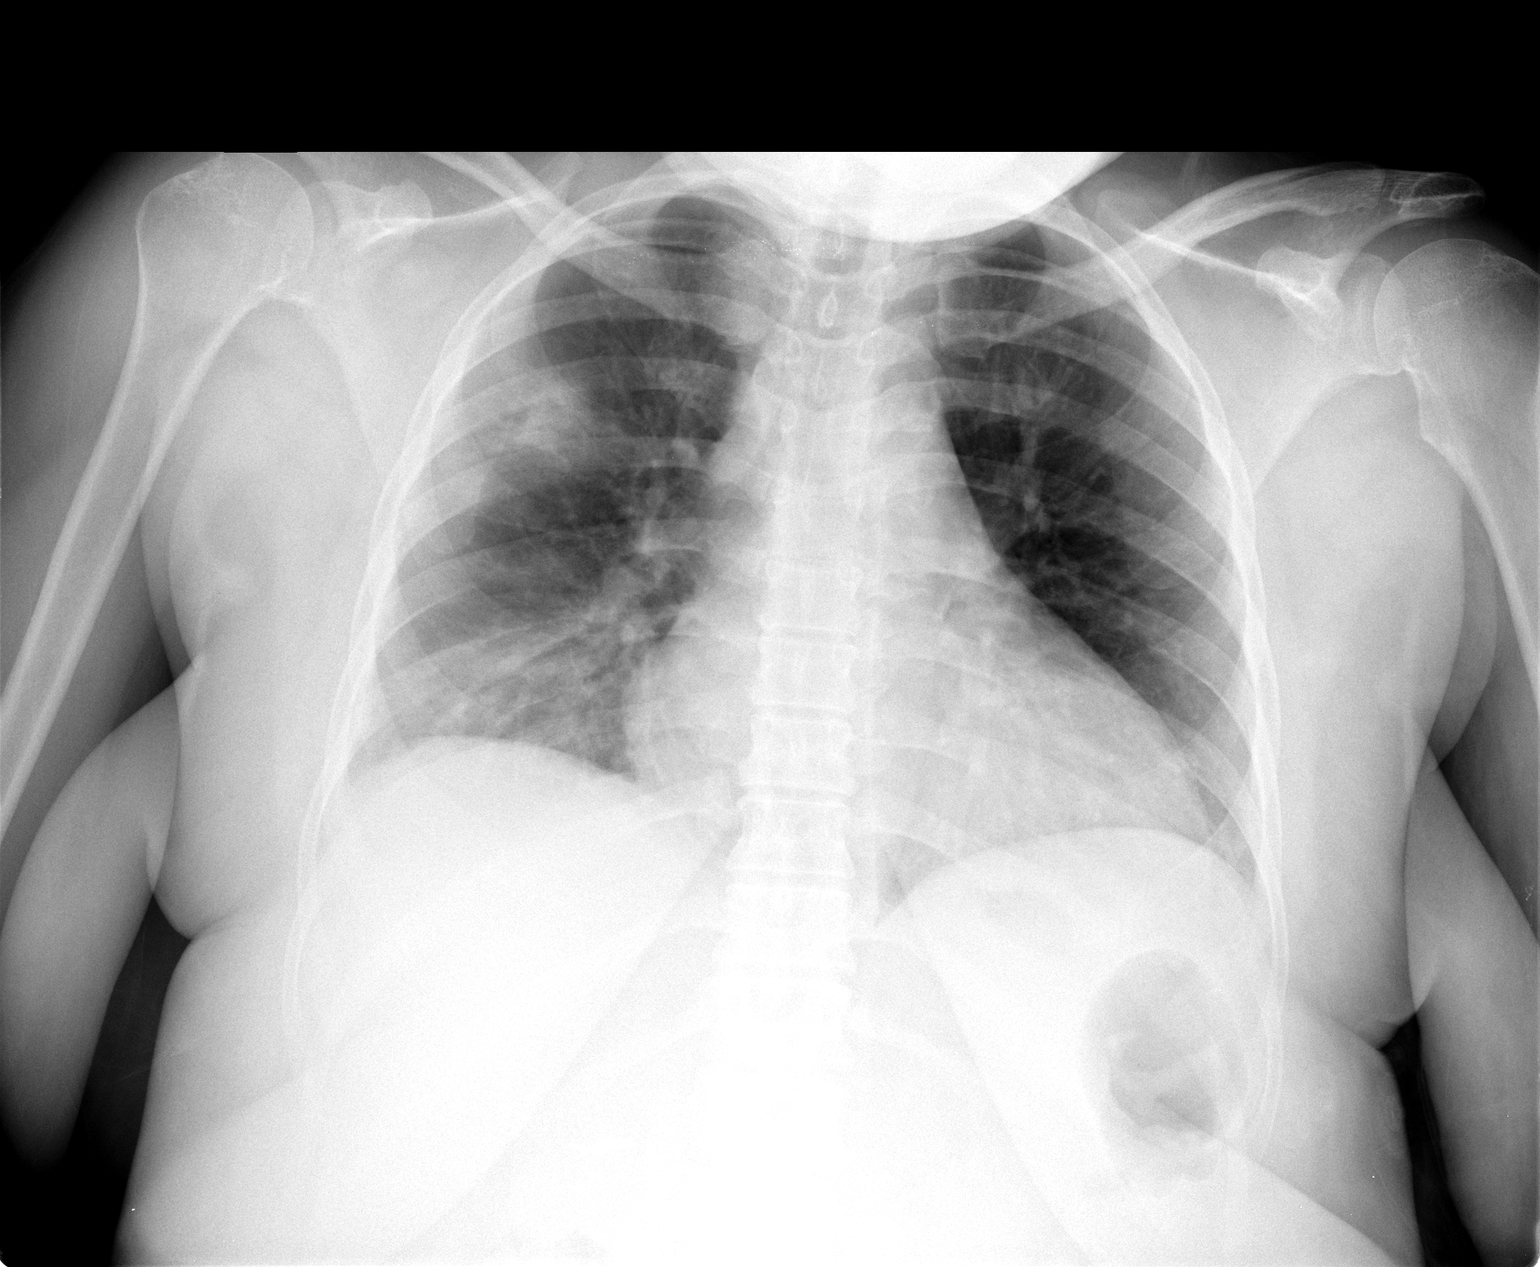

[1 of 1 positions shown; findings below may reference images not displayed]

FINDINGS: The cardiopericardial silhouette remains enlarged.  The
pattern of underlying edema is significantly improved.  There is
residual airspace disease involving the right upper lobe and right
lower lobe worrisome for pneumonia.  The right lower lobe disease
is less severe than seen on the study of 03/17/2008.  A small right
pleural effusion is not excluded.  The left lung is clear.
IMPRESSION: 1.  Right upper and lower lobe airspace disease worrisome for
pneumonia.
2.  Significant improvement in edema following dialysis.
3.  Stable cardiomegaly.
4.  Question small right pleural effusion.

## 2010-06-08 IMAGING — CR DG CHEST 1V PORT
1 series · 1 of 1 positions shown · non-contrast
Comparison: 03/17/2008

CLINICAL DATA: Short of breath/chest pain

PORTABLE CHEST - 1 VIEW

[view not recorded]
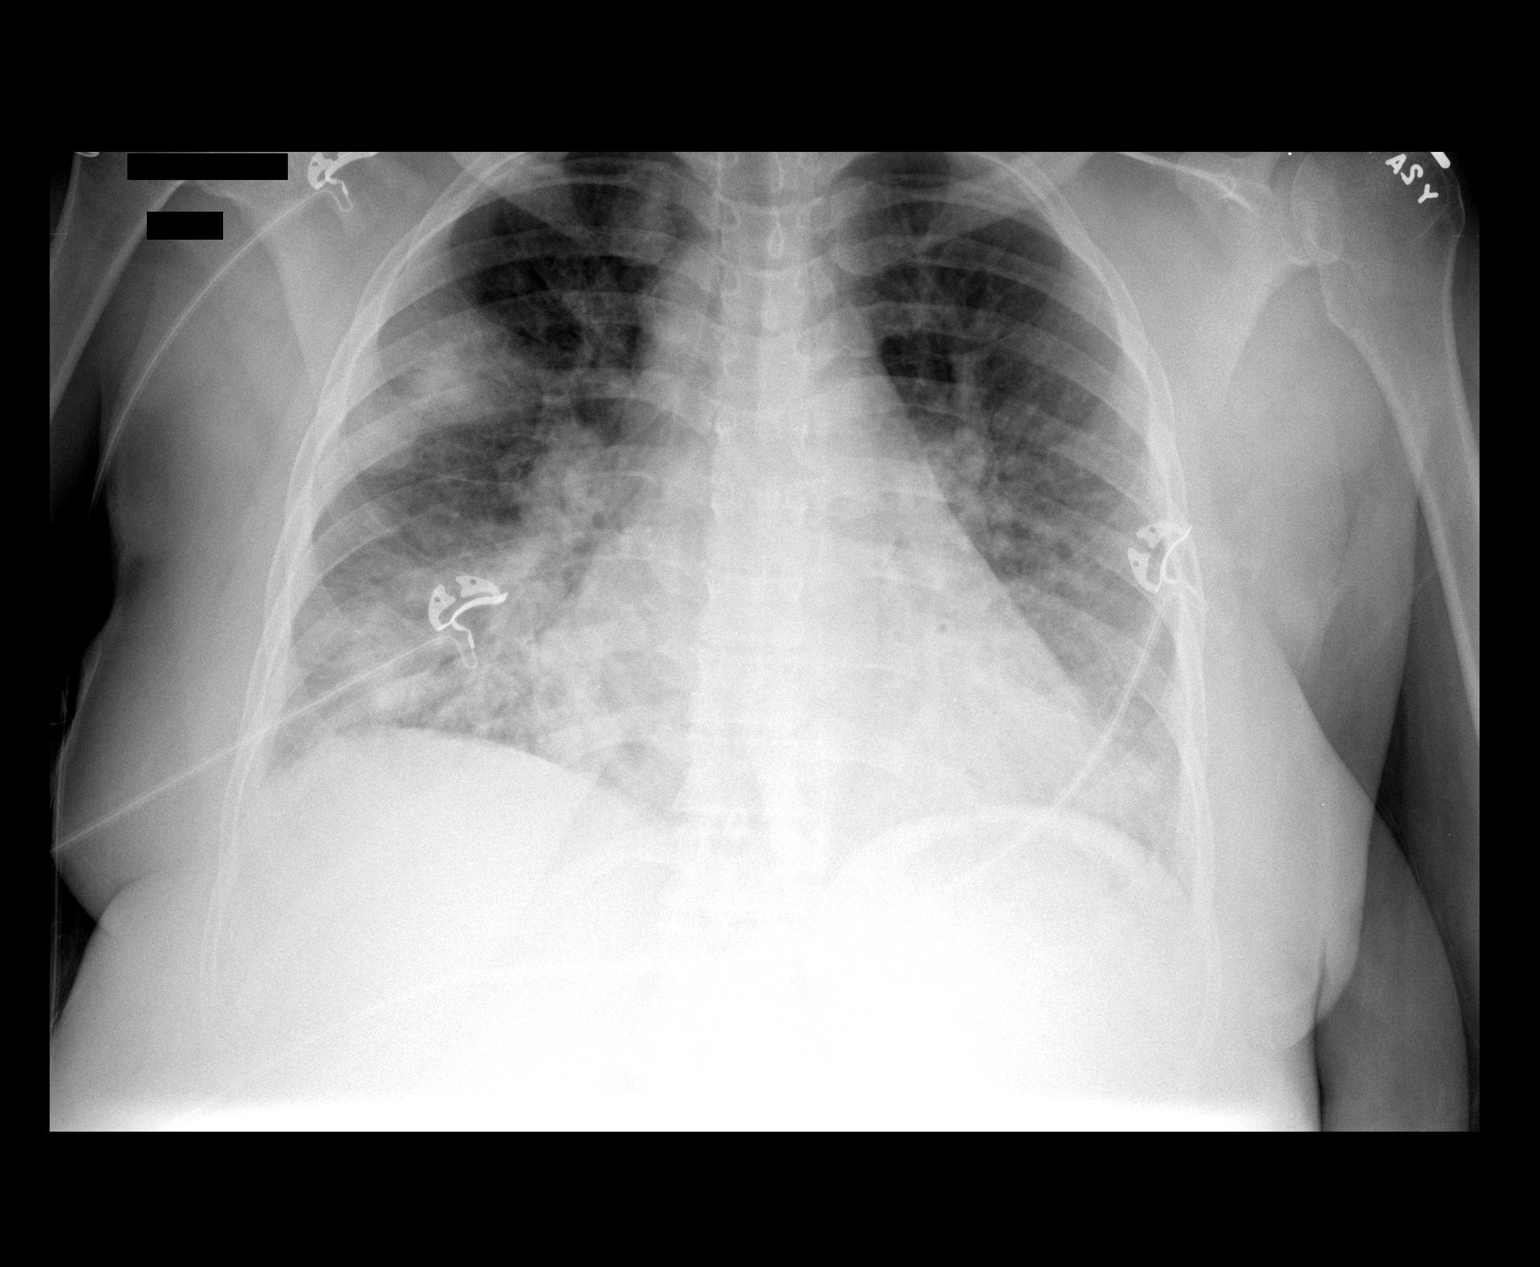

[1 of 1 positions shown; findings below may reference images not displayed]

FINDINGS: The heart is enlarged.  There is bilateral airspace
disease which is predominantly perihilar and lower lobe in
distribution.  However there is also right upper lobe confluent
density.

The main differential diagnosis is atypical pulmonary edema versus
bilateral pneumonia.  Similar findings were noted previously
although on today's exam the densities are more prominent, and the
right upper lobe density is new.
IMPRESSION: 1.  Bilateral airspace densities increased since 03/17/2008.  The
main differential diagnosis of bilateral pneumonia versus atypical
pulmonary edema.

## 2010-06-09 IMAGING — CT CT HEAD WO/W CM
2 of 3 series · 13 of 30 positions shown, 16 images · IV contrast (agent unspecified)
Comparison: None

CLINICAL DATA: Severe headache.  Renal failure.  Transplant
patient.

CT HEAD WITHOUT AND WITH CONTRAST
TECHNIQUE: Contiguous axial images were obtained from the base of
the skull through the vertex without and with intravenous contrast.
Contrast: 100 ml [id number]-SKK

[Series 2: head without 4.8 h37s · axial · non-contrast · 0.49mm/px · z∈[-127,+15]mm · 11 of 36 slices shown, 14 images]
[im 3/36  brain]
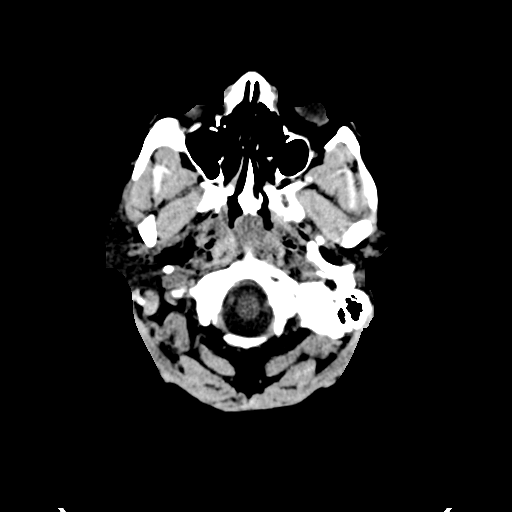
[im 3/36  bone]
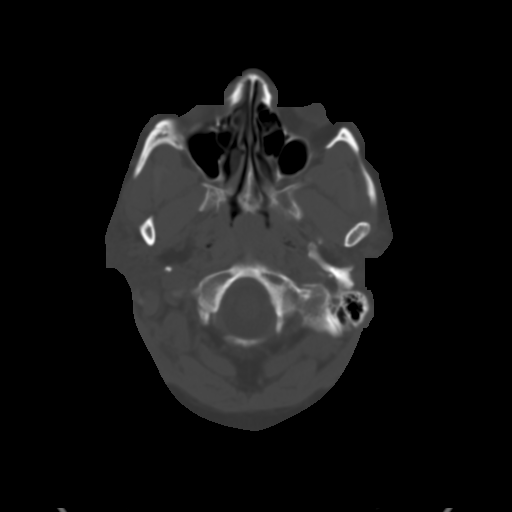
[im 6/36  brain]
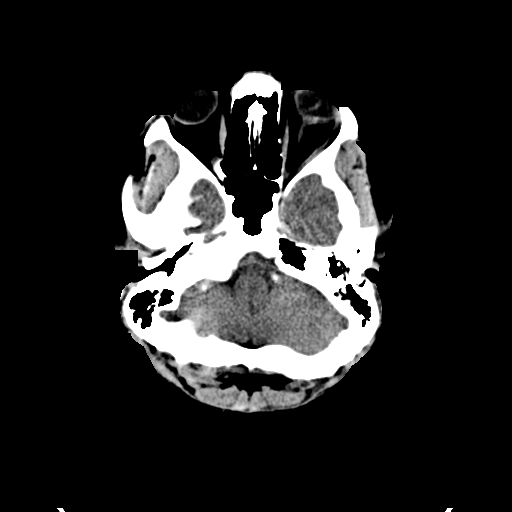
[im 9/36  brain]
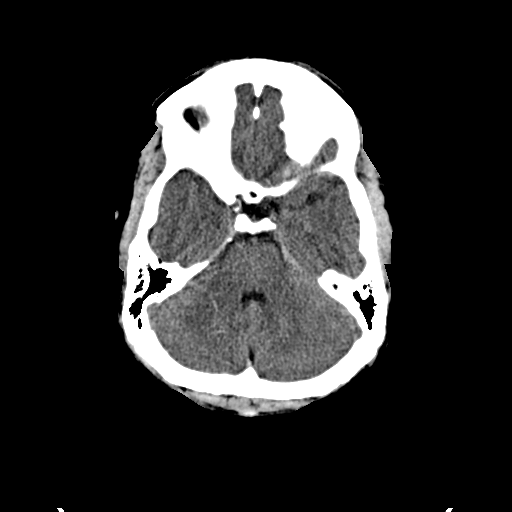
[im 12/36  brain]
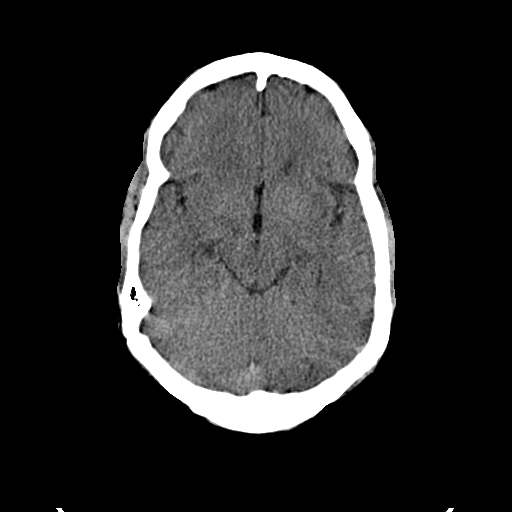
[im 15/36  brain]
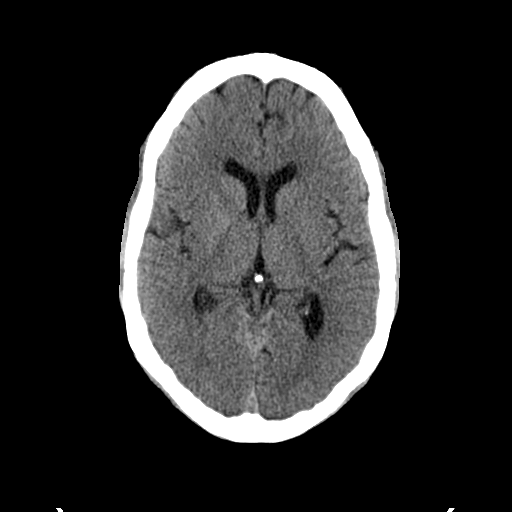
[im 15/36  bone]
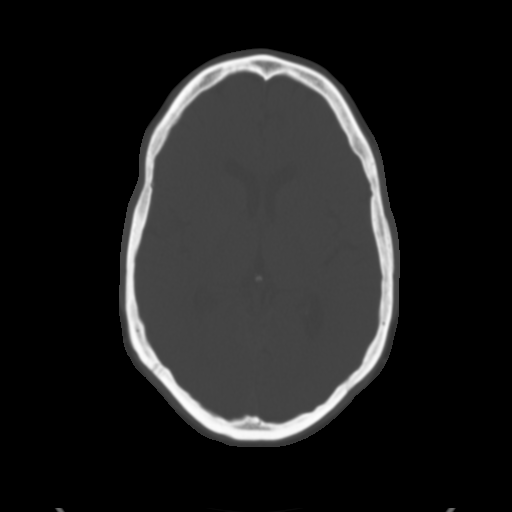
[im 18/36  brain]
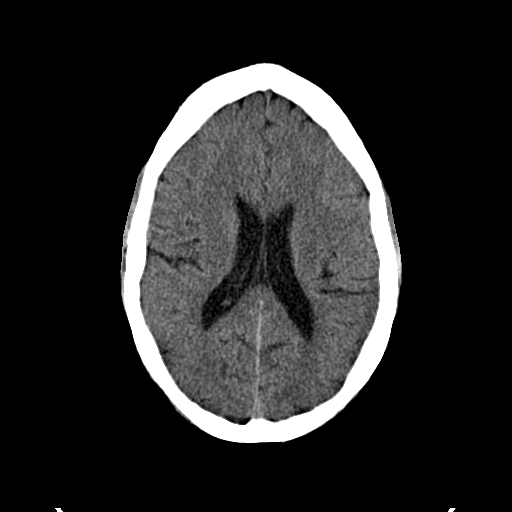
[im 21/36  brain]
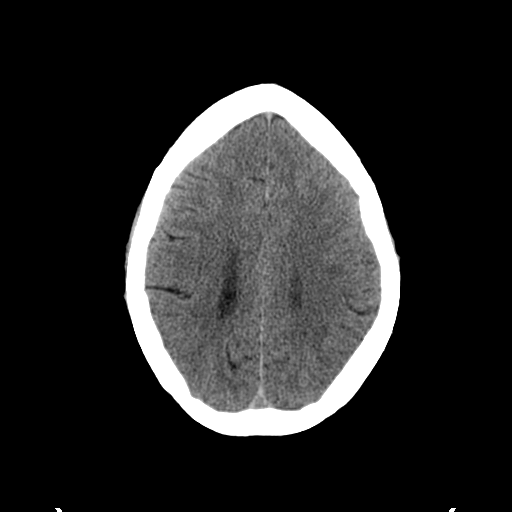
[im 24/36  brain]
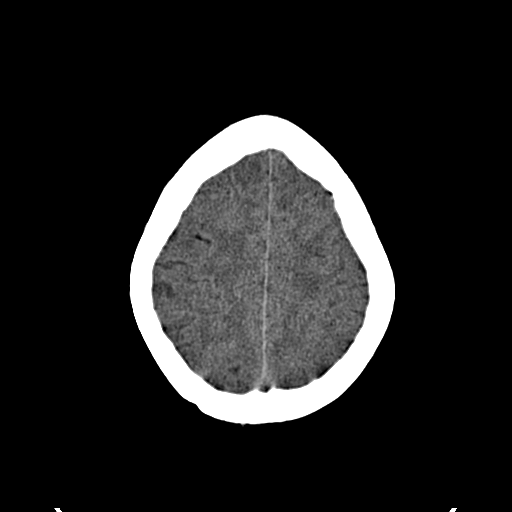
[im 27/36  brain]
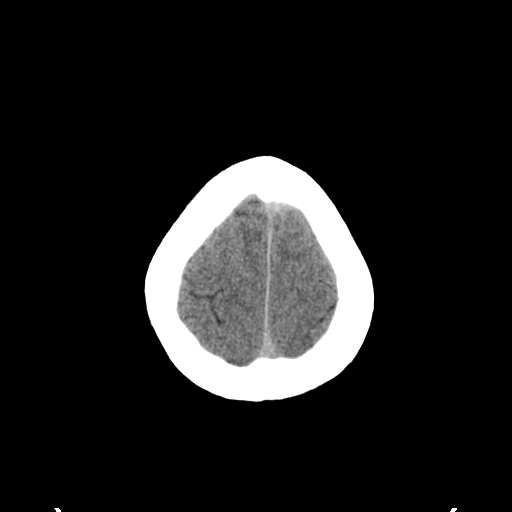
[im 27/36  bone]
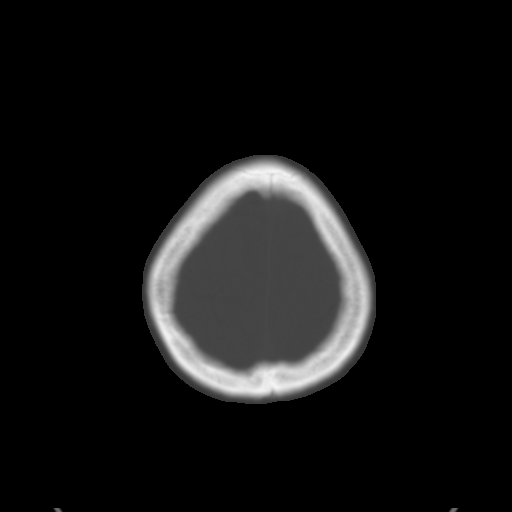
[im 30/36  brain]
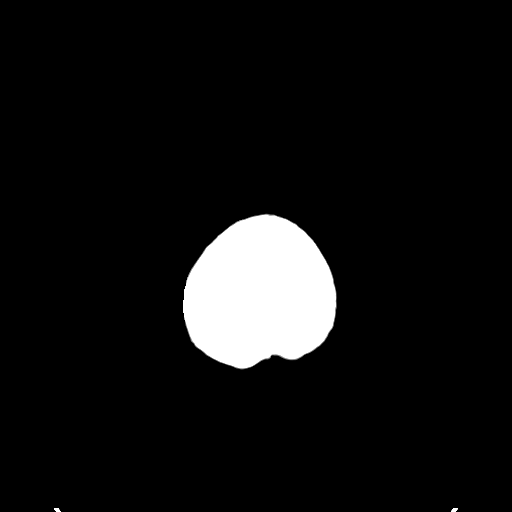
[im 33/36  brain]
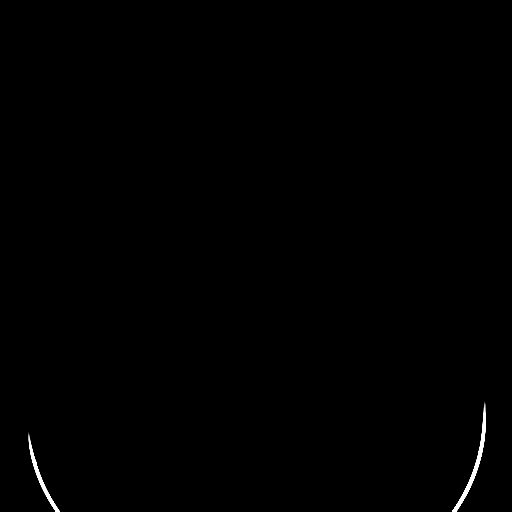

[Series 4: head with 4.8 h37s · axial · 0.49mm/px · z∈[-11,+7]mm · 2 of 12 slices shown]
[im 4/12  brain]
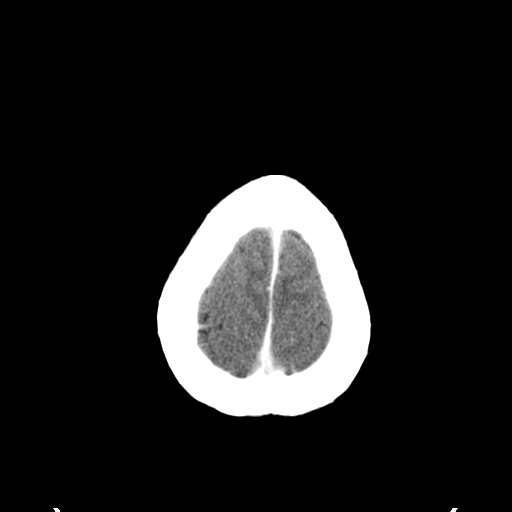
[im 8/12  brain]
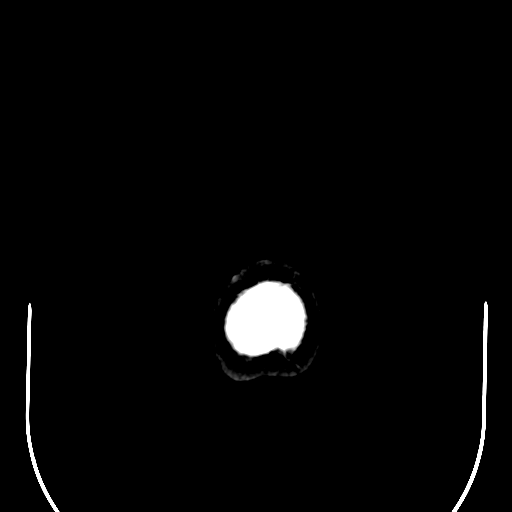

[13 of 30 positions shown; findings below may reference images not displayed]

FINDINGS: There is no evidence of acute infarction, mass lesion,
hemorrhage, hydrocephalus or extra-axial collection.  There is
physiologic calcification within the basal ganglia.  There is an
insignificant venous angioma in the right cerebellum.  The
calvarium is unremarkable.  Sinuses, middle ears and mass
IMPRESSION: No abnormalities seen related to the patient's described symptoms.
Negative head CT.  See above.

## 2010-06-17 ENCOUNTER — Encounter: Payer: Self-pay | Admitting: Nephrology

## 2010-06-18 ENCOUNTER — Encounter: Payer: Self-pay | Admitting: Nephrology

## 2010-06-18 ENCOUNTER — Encounter: Payer: Self-pay | Admitting: Obstetrics

## 2010-06-19 ENCOUNTER — Encounter: Payer: Self-pay | Admitting: Obstetrics

## 2010-06-19 ENCOUNTER — Encounter: Payer: Self-pay | Admitting: Nephrology

## 2010-07-14 IMAGING — CR DG CHEST 2V
2 series · 2 of 2 positions shown · non-contrast
Comparison: 05/17/2008

CLINICAL DATA: Nausea/sinus drainage

CHEST - 2 VIEW

[w chest pa]
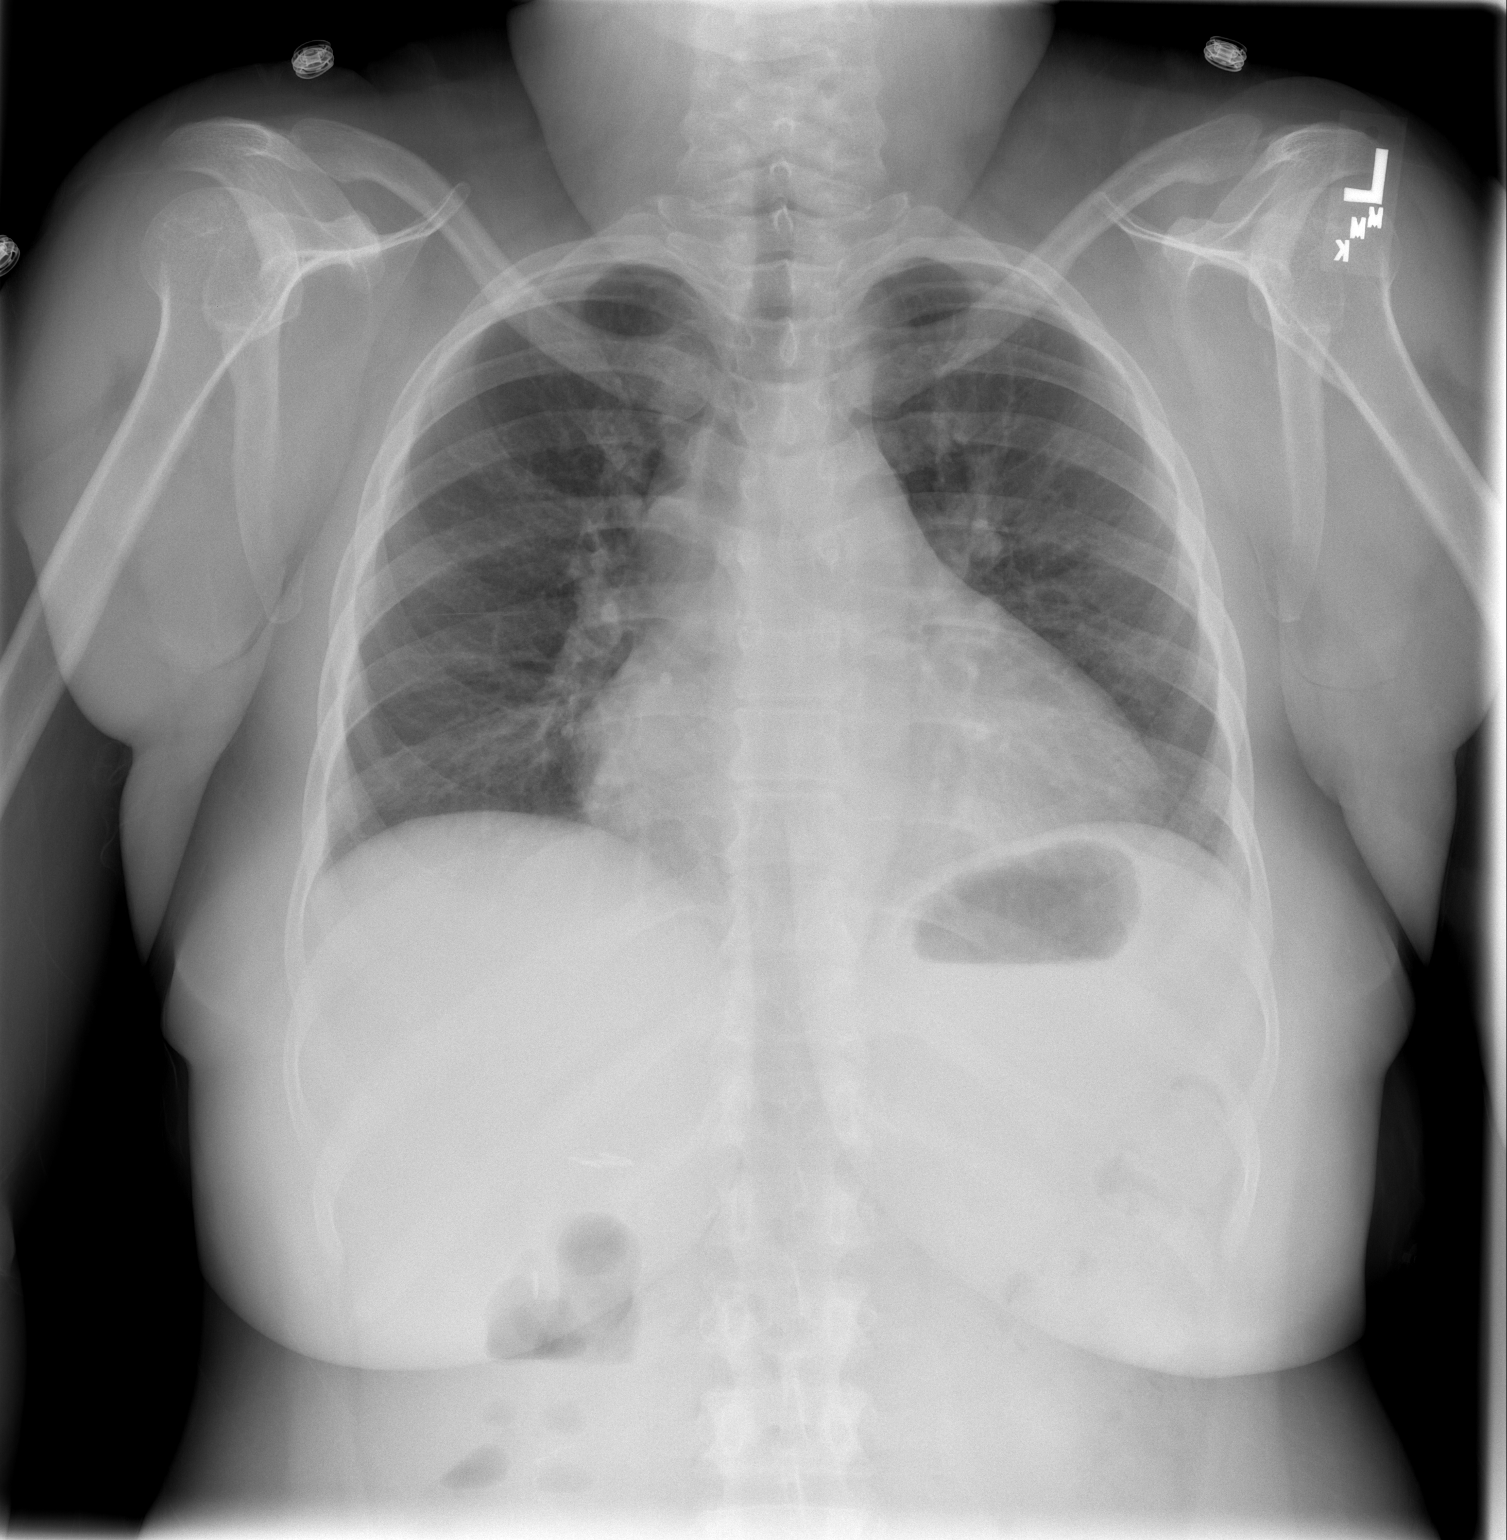

[w chest lat]
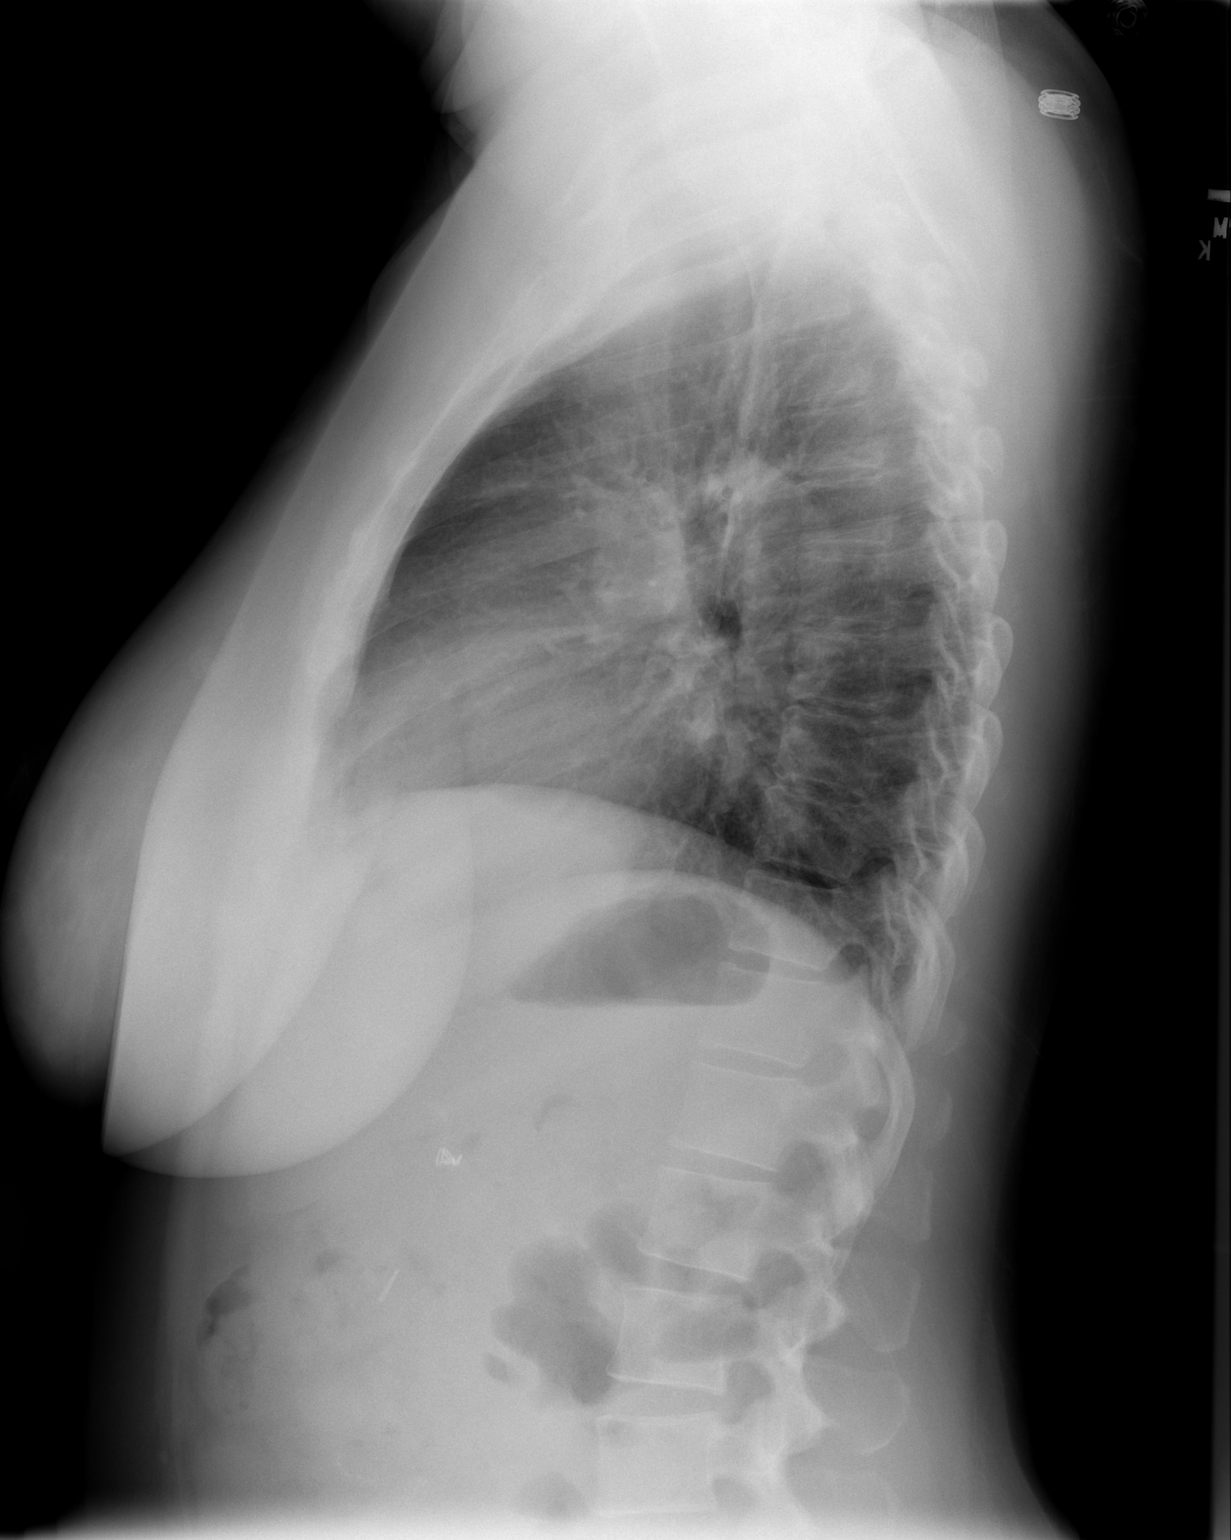

[2 of 2 positions shown; findings below may reference images not displayed]

FINDINGS: Right lung infiltrates appear to have completely
resolved.  The lungs are currently clear.  The heart is mildly
enlarged.  There is no congestive heart failure.  Osseous
structures intact.  No pleural fluid.
IMPRESSION: 1.  Right lung densities resolved - the lungs are currently clear.
2.  Mild cardiomegaly without congestive heart failure.

## 2010-07-30 ENCOUNTER — Inpatient Hospital Stay (HOSPITAL_COMMUNITY)
Admission: EM | Admit: 2010-07-30 | Discharge: 2010-08-07 | DRG: 871 | Disposition: A | Discharge status: Home Health | Payer: Medicare Other | Attending: Family Medicine | Admitting: Family Medicine

## 2010-07-30 ENCOUNTER — Emergency Department (HOSPITAL_COMMUNITY): Payer: Medicare Other

## 2010-07-30 DIAGNOSIS — N186 End stage renal disease: Secondary | ICD-10-CM | POA: Diagnosis present

## 2010-07-30 DIAGNOSIS — Z7982 Long term (current) use of aspirin: Secondary | ICD-10-CM

## 2010-07-30 DIAGNOSIS — N2581 Secondary hyperparathyroidism of renal origin: Secondary | ICD-10-CM | POA: Diagnosis present

## 2010-07-30 DIAGNOSIS — F4321 Adjustment disorder with depressed mood: Secondary | ICD-10-CM | POA: Diagnosis present

## 2010-07-30 DIAGNOSIS — Z9483 Pancreas transplant status: Secondary | ICD-10-CM

## 2010-07-30 DIAGNOSIS — R7881 Bacteremia: Principal | ICD-10-CM | POA: Diagnosis present

## 2010-07-30 DIAGNOSIS — N039 Chronic nephritic syndrome with unspecified morphologic changes: Secondary | ICD-10-CM | POA: Diagnosis present

## 2010-07-30 DIAGNOSIS — B9689 Other specified bacterial agents as the cause of diseases classified elsewhere: Secondary | ICD-10-CM | POA: Diagnosis present

## 2010-07-30 DIAGNOSIS — IMO0002 Reserved for concepts with insufficient information to code with codable children: Secondary | ICD-10-CM

## 2010-07-30 DIAGNOSIS — Z79899 Other long term (current) drug therapy: Secondary | ICD-10-CM

## 2010-07-30 DIAGNOSIS — Z992 Dependence on renal dialysis: Secondary | ICD-10-CM

## 2010-07-30 DIAGNOSIS — I120 Hypertensive chronic kidney disease with stage 5 chronic kidney disease or end stage renal disease: Secondary | ICD-10-CM | POA: Diagnosis present

## 2010-07-30 DIAGNOSIS — D631 Anemia in chronic kidney disease: Secondary | ICD-10-CM | POA: Diagnosis present

## 2010-07-30 DIAGNOSIS — E1065 Type 1 diabetes mellitus with hyperglycemia: Secondary | ICD-10-CM | POA: Diagnosis present

## 2010-07-30 DIAGNOSIS — L299 Pruritus, unspecified: Secondary | ICD-10-CM | POA: Diagnosis present

## 2010-07-30 LAB — POCT I-STAT, CHEM 8
BUN: 26 mg/dL — ABNORMAL HIGH (ref 6–23)
Calcium, Ion: 0.98 mmol/L — ABNORMAL LOW (ref 1.12–1.32)
Glucose, Bld: 464 mg/dL — ABNORMAL HIGH (ref 70–99)
TCO2: 28 mmol/L (ref 0–100)

## 2010-07-30 LAB — BASIC METABOLIC PANEL
BUN: 22 mg/dL (ref 6–23)
CO2: 28 mEq/L (ref 19–32)
Glucose, Bld: 477 mg/dL — ABNORMAL HIGH (ref 70–99)
Potassium: 3.2 mEq/L — ABNORMAL LOW (ref 3.5–5.1)
Sodium: 124 mEq/L — ABNORMAL LOW (ref 135–145)

## 2010-07-30 LAB — CBC
HCT: 31.2 % — ABNORMAL LOW (ref 36.0–46.0)
Hemoglobin: 10.4 g/dL — ABNORMAL LOW (ref 12.0–15.0)
MCHC: 33.3 g/dL (ref 30.0–36.0)
MCV: 93.4 fL (ref 78.0–100.0)
WBC: 7.9 10*3/uL (ref 4.0–10.5)

## 2010-07-30 LAB — DIFFERENTIAL
Basophils Absolute: 0 10*3/uL (ref 0.0–0.1)
Eosinophils Relative: 1 % (ref 0–5)
Lymphocytes Relative: 39 % (ref 12–46)
Lymphs Abs: 3.1 10*3/uL (ref 0.7–4.0)
Monocytes Absolute: 0.6 10*3/uL (ref 0.1–1.0)
Neutro Abs: 4.1 10*3/uL (ref 1.7–7.7)

## 2010-07-30 LAB — GLUCOSE, CAPILLARY: Glucose-Capillary: 290 mg/dL — ABNORMAL HIGH (ref 70–99)

## 2010-07-31 LAB — GLUCOSE, CAPILLARY
Glucose-Capillary: 305 mg/dL — ABNORMAL HIGH (ref 70–99)
Glucose-Capillary: 393 mg/dL — ABNORMAL HIGH (ref 70–99)
Glucose-Capillary: 419 mg/dL — ABNORMAL HIGH (ref 70–99)

## 2010-07-31 LAB — CK TOTAL AND CKMB (NOT AT ARMC): Total CK: 44 U/L (ref 7–177)

## 2010-07-31 LAB — LIPID PANEL
Cholesterol: 203 mg/dL — ABNORMAL HIGH (ref 0–200)
Cholesterol: 200 mg/dL (ref 0–200)
HDL: 72 mg/dL (ref >39)
HDL: 70 mg/dL (ref >39)
LDL Cholesterol: 84 mg/dL (ref 0–99)
LDL Cholesterol: 83 mg/dL (ref 0–99)
Total CHOL/HDL Ratio: 2.8 RATIO
Total CHOL/HDL Ratio: 2.9 RATIO
Triglycerides: 234 mg/dL — ABNORMAL HIGH (ref <150)
Triglycerides: 233 mg/dL — ABNORMAL HIGH (ref <150)

## 2010-07-31 LAB — DIFFERENTIAL
Lymphocytes Relative: 23 % (ref 12–46)
Lymphs Abs: 1.7 10*3/uL (ref 0.7–4.0)
Monocytes Relative: 10 % (ref 3–12)
Neutro Abs: 4.8 10*3/uL (ref 1.7–7.7)
Neutrophils Relative %: 65 % (ref 43–77)

## 2010-07-31 LAB — CBC
Hemoglobin: 9.5 g/dL — ABNORMAL LOW (ref 12.0–15.0)
MCH: 31.7 pg (ref 26.0–34.0)
MCV: 94.3 fL (ref 78.0–100.0)
Platelets: 194 10*3/uL (ref 150–400)
RBC: 3 MIL/uL — ABNORMAL LOW (ref 3.87–5.11)
WBC: 7.4 10*3/uL (ref 4.0–10.5)

## 2010-07-31 LAB — INFLUENZA PANEL BY PCR (TYPE A & B)
Influenza A By PCR: NEGATIVE
Influenza B By PCR: NEGATIVE

## 2010-07-31 LAB — HEMOGLOBIN A1C: Mean Plasma Glucose: 315 mg/dL — ABNORMAL HIGH (ref <117)

## 2010-07-31 LAB — COMPREHENSIVE METABOLIC PANEL
Albumin: 3.1 g/dL — ABNORMAL LOW (ref 3.5–5.2)
Alkaline Phosphatase: 66 U/L (ref 39–117)
BUN: 32 mg/dL — ABNORMAL HIGH (ref 6–23)
Chloride: 93 mEq/L — ABNORMAL LOW (ref 96–112)
Creatinine, Ser: 9.48 mg/dL — ABNORMAL HIGH (ref 0.4–1.2)
Glucose, Bld: 304 mg/dL — ABNORMAL HIGH (ref 70–99)
Potassium: 3.8 mEq/L (ref 3.5–5.1)
Total Bilirubin: 0.4 mg/dL (ref 0.3–1.2)

## 2010-07-31 LAB — HEPATIC FUNCTION PANEL
Albumin: 3.5 g/dL (ref 3.5–5.2)
Alkaline Phosphatase: 77 U/L (ref 39–117)
Total Protein: 7.2 g/dL (ref 6.0–8.3)

## 2010-07-31 LAB — CARDIAC PANEL(CRET KIN+CKTOT+MB+TROPI)
Relative Index: INVALID (ref 0.0–2.5)
Total CK: 46 U/L (ref 7–177)
Troponin I: <0.01 ng/mL (ref 0.00–0.06)
Troponin I: 0.02 ng/mL (ref 0.00–0.06)

## 2010-07-31 LAB — GLUCOSE, RANDOM: Glucose, Bld: 407 mg/dL — ABNORMAL HIGH (ref 70–99)

## 2010-07-31 LAB — TROPONIN I: Troponin I: 0.01 ng/mL (ref 0.00–0.06)

## 2010-08-01 ENCOUNTER — Observation Stay (HOSPITAL_COMMUNITY): Payer: Medicare Other

## 2010-08-01 LAB — GLUCOSE, CAPILLARY
Glucose-Capillary: 191 mg/dL — ABNORMAL HIGH (ref 70–99)
Glucose-Capillary: >600 mg/dL (ref 70–99)
Glucose-Capillary: >600 mg/dL (ref 70–99)
Glucose-Capillary: 541 mg/dL — ABNORMAL HIGH (ref 70–99)

## 2010-08-01 LAB — CBC
HCT: 24.6 % — ABNORMAL LOW (ref 36.0–46.0)
HCT: 29 % — ABNORMAL LOW (ref 36.0–46.0)
MCH: 30.7 pg (ref 26.0–34.0)
MCHC: 32.5 g/dL (ref 30.0–36.0)
MCHC: 32.1 g/dL (ref 30.0–36.0)
MCV: 94.3 fL (ref 78.0–100.0)
MCV: 97.3 fL (ref 78.0–100.0)
Platelets: 205 10*3/uL (ref 150–400)
Platelets: 232 10*3/uL (ref 150–400)
RDW: 13.1 % (ref 11.5–15.5)
RDW: 13.3 % (ref 11.5–15.5)
WBC: 6.4 10*3/uL (ref 4.0–10.5)

## 2010-08-01 LAB — FERRITIN: Ferritin: 600 ng/mL — ABNORMAL HIGH (ref 10–291)

## 2010-08-01 LAB — RENAL FUNCTION PANEL
Albumin: 3 g/dL — ABNORMAL LOW (ref 3.5–5.2)
BUN: 46 mg/dL — ABNORMAL HIGH (ref 6–23)
Creatinine, Ser: 11.06 mg/dL — ABNORMAL HIGH (ref 0.4–1.2)
Glucose, Bld: 152 mg/dL — ABNORMAL HIGH (ref 70–99)
Phosphorus: 2.6 mg/dL (ref 2.3–4.6)

## 2010-08-01 LAB — GLUCOSE, RANDOM: Glucose, Bld: 794 mg/dL (ref 70–99)

## 2010-08-02 ENCOUNTER — Observation Stay (HOSPITAL_COMMUNITY): Payer: Medicare Other

## 2010-08-02 LAB — GLUCOSE, CAPILLARY
Glucose-Capillary: 344 mg/dL — ABNORMAL HIGH (ref 70–99)
Glucose-Capillary: 428 mg/dL — ABNORMAL HIGH (ref 70–99)

## 2010-08-03 ENCOUNTER — Observation Stay (HOSPITAL_COMMUNITY): Payer: Medicare Other

## 2010-08-03 LAB — BASIC METABOLIC PANEL
BUN: 46 mg/dL — ABNORMAL HIGH (ref 6–23)
CO2: 25 mEq/L (ref 19–32)
GFR calc non Af Amer: 5 mL/min — ABNORMAL LOW (ref >60)
Glucose, Bld: 261 mg/dL — ABNORMAL HIGH (ref 70–99)
Potassium: 5.3 mEq/L — ABNORMAL HIGH (ref 3.5–5.1)
Sodium: 131 mEq/L — ABNORMAL LOW (ref 135–145)

## 2010-08-03 LAB — RENAL FUNCTION PANEL
BUN: 47 mg/dL — ABNORMAL HIGH (ref 6–23)
CO2: 24 mEq/L (ref 19–32)
Calcium: 9.2 mg/dL (ref 8.4–10.5)
Chloride: 98 mEq/L (ref 96–112)
Creatinine, Ser: 8.54 mg/dL — ABNORMAL HIGH (ref 0.4–1.2)
Creatinine, Ser: 9 mg/dL — ABNORMAL HIGH (ref 0.4–1.2)
GFR calc Af Amer: 6 mL/min — ABNORMAL LOW (ref >60)
GFR calc non Af Amer: 5 mL/min — ABNORMAL LOW (ref >60)
Glucose, Bld: 247 mg/dL — ABNORMAL HIGH (ref 70–99)
Glucose, Bld: 372 mg/dL — ABNORMAL HIGH (ref 70–99)
Phosphorus: <1 mg/dL — CL (ref 2.3–4.6)
Potassium: 5 mEq/L (ref 3.5–5.1)
Sodium: 132 mEq/L — ABNORMAL LOW (ref 135–145)

## 2010-08-03 LAB — CBC
HCT: 27.4 % — ABNORMAL LOW (ref 36.0–46.0)
Hemoglobin: 8.8 g/dL — ABNORMAL LOW (ref 12.0–15.0)
MCH: 31 pg (ref 26.0–34.0)
MCH: 31.2 pg (ref 26.0–34.0)
MCHC: 32.1 g/dL (ref 30.0–36.0)
MCHC: 32.4 g/dL (ref 30.0–36.0)
MCV: 97.2 fL (ref 78.0–100.0)
RBC: 2.82 MIL/uL — ABNORMAL LOW (ref 3.87–5.11)
RDW: 13.4 % (ref 11.5–15.5)

## 2010-08-03 LAB — GLUCOSE, CAPILLARY
Glucose-Capillary: 185 mg/dL — ABNORMAL HIGH (ref 70–99)
Glucose-Capillary: 185 mg/dL — ABNORMAL HIGH (ref 70–99)
Glucose-Capillary: 27 mg/dL — CL (ref 70–99)
Glucose-Capillary: 336 mg/dL — ABNORMAL HIGH (ref 70–99)
Glucose-Capillary: >600 mg/dL (ref 70–99)

## 2010-08-04 ENCOUNTER — Observation Stay (HOSPITAL_COMMUNITY): Payer: Medicare Other

## 2010-08-04 LAB — CBC
HCT: 27.7 % — ABNORMAL LOW (ref 36.0–46.0)
Hemoglobin: 8.9 g/dL — ABNORMAL LOW (ref 12.0–15.0)
MCHC: 32.1 g/dL (ref 30.0–36.0)
MCV: 98.6 fL (ref 78.0–100.0)

## 2010-08-04 LAB — RENAL FUNCTION PANEL
CO2: 27 mEq/L (ref 19–32)
Glucose, Bld: 185 mg/dL — ABNORMAL HIGH (ref 70–99)
Phosphorus: 2.7 mg/dL (ref 2.3–4.6)
Potassium: 4.4 mEq/L (ref 3.5–5.1)
Sodium: 136 mEq/L (ref 135–145)

## 2010-08-04 LAB — GLUCOSE, CAPILLARY
Glucose-Capillary: 117 mg/dL — ABNORMAL HIGH (ref 70–99)
Glucose-Capillary: 155 mg/dL — ABNORMAL HIGH (ref 70–99)
Glucose-Capillary: 183 mg/dL — ABNORMAL HIGH (ref 70–99)
Glucose-Capillary: 368 mg/dL — ABNORMAL HIGH (ref 70–99)

## 2010-08-04 LAB — CULTURE, BLOOD (ROUTINE X 2): Culture  Setup Time: 201203050849

## 2010-08-05 ENCOUNTER — Inpatient Hospital Stay (HOSPITAL_COMMUNITY): Payer: Medicare Other

## 2010-08-05 LAB — RENAL FUNCTION PANEL
BUN: 40 mg/dL — ABNORMAL HIGH (ref 6–23)
Chloride: 99 mEq/L (ref 96–112)
Glucose, Bld: 248 mg/dL — ABNORMAL HIGH (ref 70–99)
Potassium: 4.9 mEq/L (ref 3.5–5.1)

## 2010-08-05 LAB — CROSSMATCH
Antibody Screen: NEGATIVE
Unit division: 0

## 2010-08-05 LAB — CBC
HCT: 25 % — ABNORMAL LOW (ref 36.0–46.0)
MCV: 98.8 fL (ref 78.0–100.0)
RBC: 2.53 MIL/uL — ABNORMAL LOW (ref 3.87–5.11)
RDW: 14.1 % (ref 11.5–15.5)
WBC: 5.5 10*3/uL (ref 4.0–10.5)

## 2010-08-05 LAB — GLUCOSE, CAPILLARY
Glucose-Capillary: 199 mg/dL — ABNORMAL HIGH (ref 70–99)
Glucose-Capillary: 593 mg/dL (ref 70–99)
Glucose-Capillary: 92 mg/dL (ref 70–99)

## 2010-08-05 LAB — CULTURE, BLOOD (ROUTINE X 2)
Culture  Setup Time: 201203042345
Culture: NO GROWTH

## 2010-08-06 LAB — BASIC METABOLIC PANEL
GFR calc non Af Amer: 7 mL/min — ABNORMAL LOW (ref >60)
Potassium: 4.7 mEq/L (ref 3.5–5.1)
Sodium: 130 mEq/L — ABNORMAL LOW (ref 135–145)

## 2010-08-06 LAB — RENAL FUNCTION PANEL
Albumin: 3 g/dL — ABNORMAL LOW (ref 3.5–5.2)
Chloride: 93 mEq/L — ABNORMAL LOW (ref 96–112)
Creatinine, Ser: 6.28 mg/dL — ABNORMAL HIGH (ref 0.4–1.2)
GFR calc Af Amer: 9 mL/min — ABNORMAL LOW (ref >60)
GFR calc non Af Amer: 7 mL/min — ABNORMAL LOW (ref >60)
Phosphorus: 4 mg/dL (ref 2.3–4.6)
Potassium: 4.7 mEq/L (ref 3.5–5.1)

## 2010-08-06 LAB — CBC
Platelets: 234 10*3/uL (ref 150–400)
RDW: 14.3 % (ref 11.5–15.5)
WBC: 7.4 10*3/uL (ref 4.0–10.5)

## 2010-08-06 LAB — GLUCOSE, CAPILLARY
Glucose-Capillary: 245 mg/dL — ABNORMAL HIGH (ref 70–99)
Glucose-Capillary: 405 mg/dL — ABNORMAL HIGH (ref 70–99)
Glucose-Capillary: 103 mg/dL — ABNORMAL HIGH (ref 70–99)

## 2010-08-07 LAB — COMPREHENSIVE METABOLIC PANEL
ALT: <8 U/L (ref 0–35)
AST: 14 U/L (ref 0–37)
Albumin: 3.2 g/dL — ABNORMAL LOW (ref 3.5–5.2)
Alkaline Phosphatase: 58 U/L (ref 39–117)
Calcium: 8.5 mg/dL (ref 8.4–10.5)
GFR calc Af Amer: 6 mL/min — ABNORMAL LOW (ref >60)
Glucose, Bld: 377 mg/dL — ABNORMAL HIGH (ref 70–99)
Potassium: 4.6 mEq/L (ref 3.5–5.1)
Sodium: 133 mEq/L — ABNORMAL LOW (ref 135–145)
Total Protein: 6.5 g/dL (ref 6.0–8.3)

## 2010-08-07 LAB — GLUCOSE, CAPILLARY
Glucose-Capillary: 355 mg/dL — ABNORMAL HIGH (ref 70–99)
Glucose-Capillary: 261 mg/dL — ABNORMAL HIGH (ref 70–99)
Glucose-Capillary: 163 mg/dL — ABNORMAL HIGH (ref 70–99)

## 2010-08-07 LAB — POCT I-STAT 4, (NA,K, GLUC, HGB,HCT)
Potassium: 3.9 mEq/L (ref 3.5–5.1)
Sodium: 134 mEq/L — ABNORMAL LOW (ref 135–145)

## 2010-08-07 LAB — SURGICAL PCR SCREEN
MRSA, PCR: NEGATIVE
Staphylococcus aureus: NEGATIVE

## 2010-08-07 LAB — CBC
HCT: 27.4 % — ABNORMAL LOW (ref 36.0–46.0)
RDW: 14.2 % (ref 11.5–15.5)
WBC: 7.2 10*3/uL (ref 4.0–10.5)

## 2010-08-07 IMAGING — CR DG CHEST 1V PORT
1 series · 1 of 1 positions shown · non-contrast
Comparison: 06/22/2008.

CLINICAL DATA: Altered mental status.  Shortness of breath.  Renal
failure patient.  The patient had dialysis yesterday.

PORTABLE CHEST - 1 VIEW

[view not recorded]
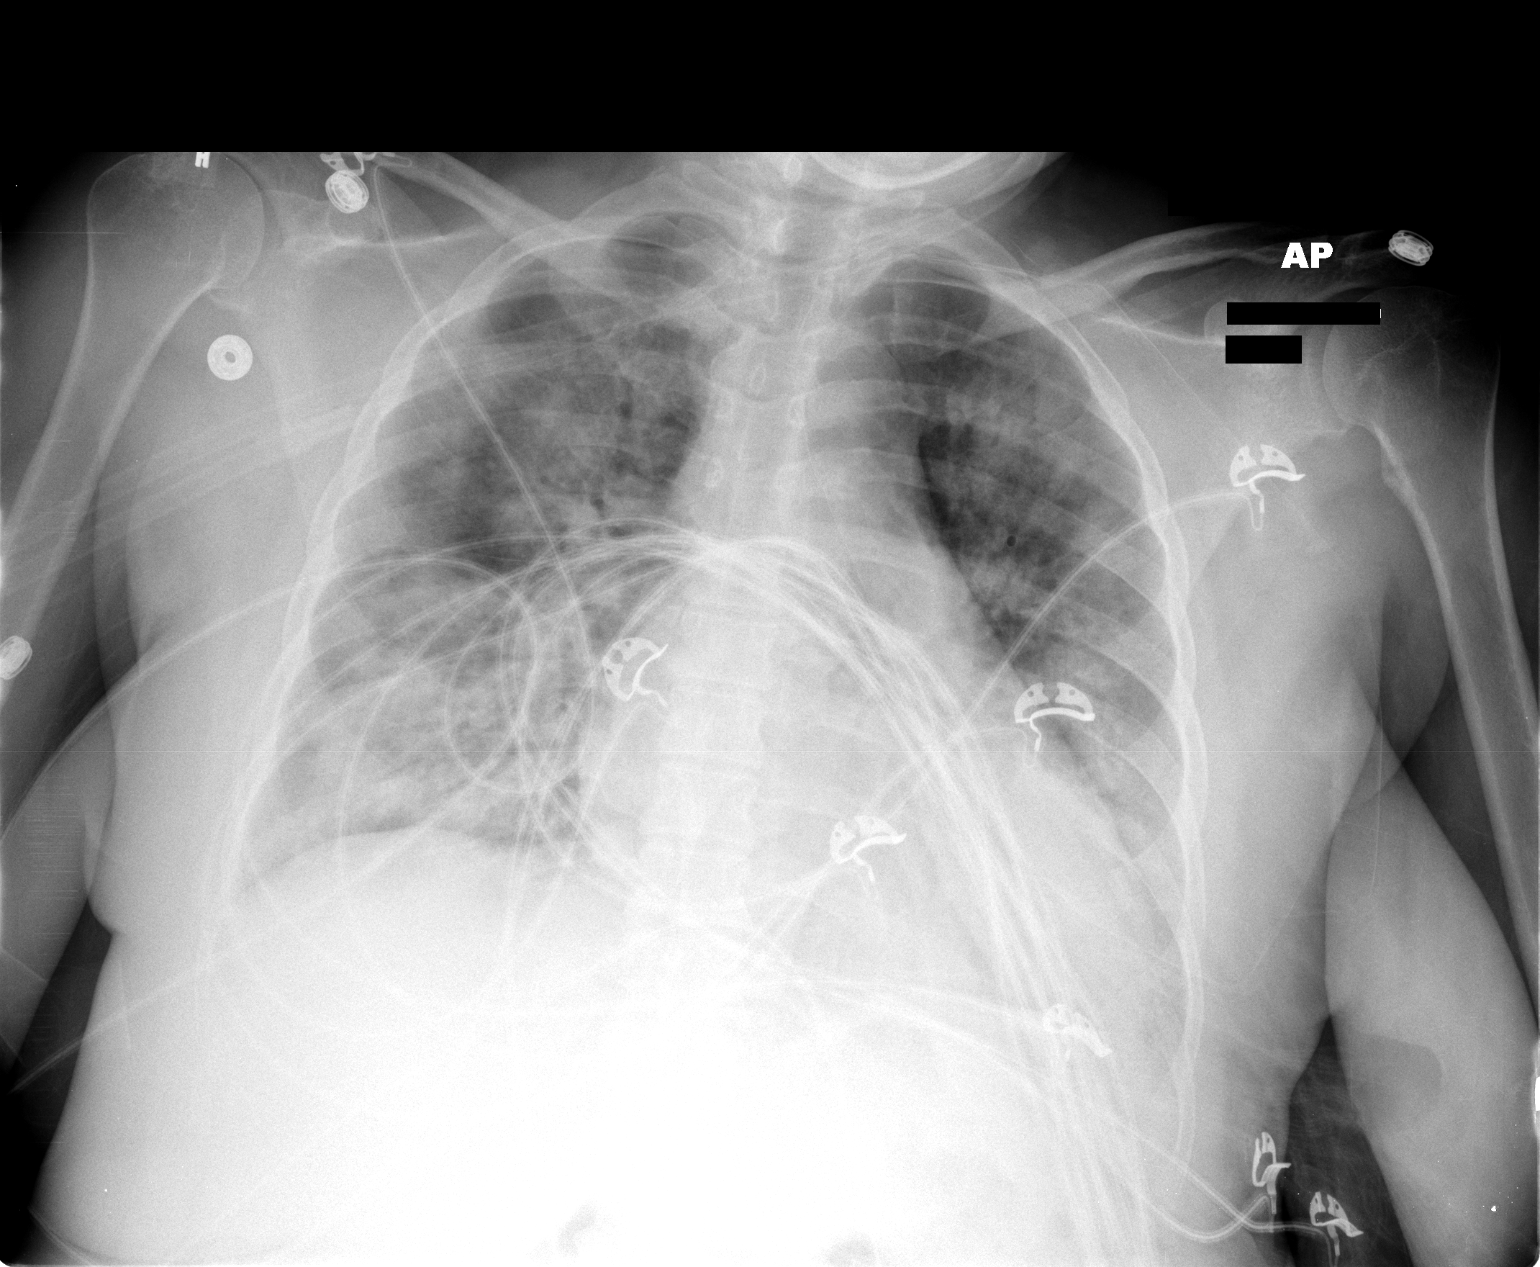

[1 of 1 positions shown; findings below may reference images not displayed]

FINDINGS: Interval patchy airspace opacity throughout both lungs.
Stable enlarged cardiac silhouette.  No pleural fluid seen.
Unremarkable bones.
IMPRESSION: 1.  Extensive bilateral airspace opacity suspicious for pneumonia.
Alveolar edema could have this appearance.
2.  Stable cardiomegaly.

## 2010-08-08 LAB — GLUCOSE, CAPILLARY: Glucose-Capillary: >600 mg/dL (ref 70–99)

## 2010-08-08 IMAGING — CR DG CHEST 1V PORT
1 series · 1 of 1 positions shown · non-contrast
Comparison: 07/17/2008 [DATE] a.m.

CLINICAL DATA: Endotracheal tube placement.

PORTABLE CHEST - 1 VIEW

[AP]
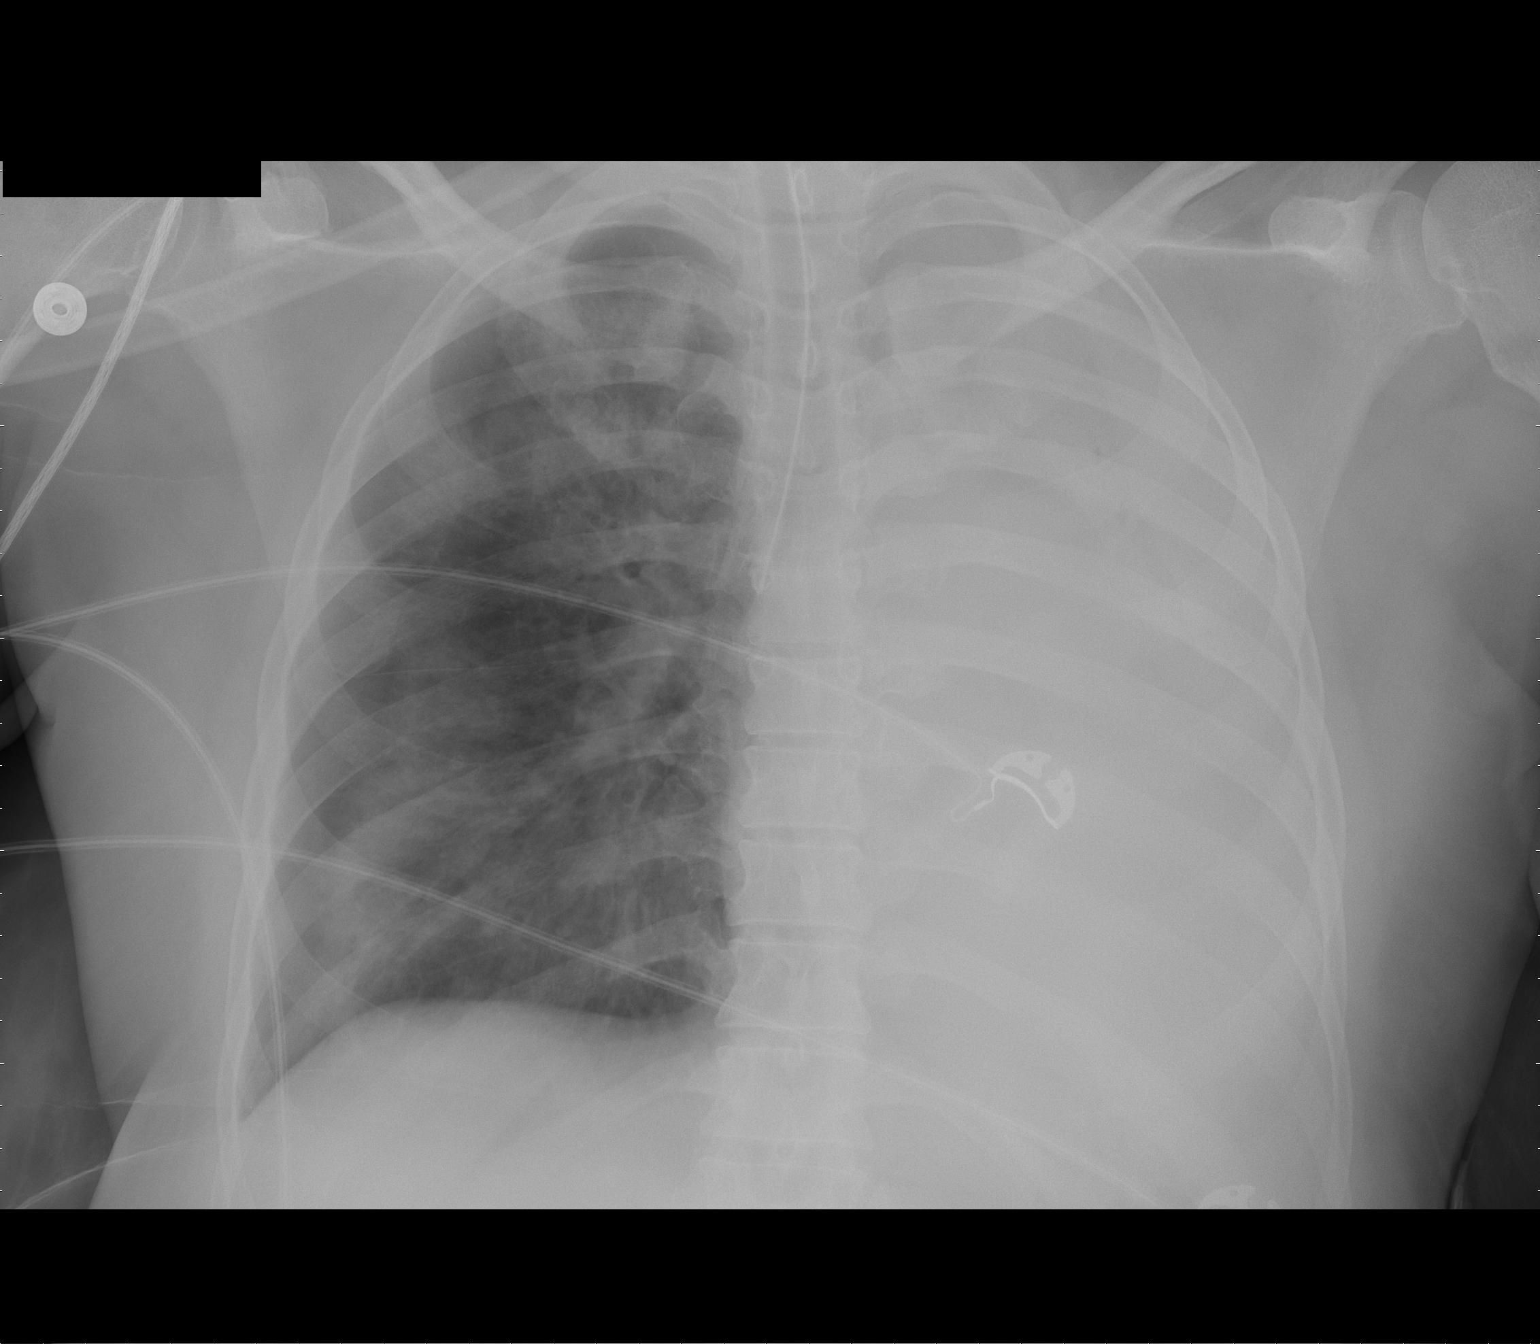

[1 of 1 positions shown; findings below may reference images not displayed]

FINDINGS: Endotracheal tube is within the right mainstem bronchus
with complete consolidation of the left lung.  This needs to be
retracted by 4 cm.  Mediastinal shift towards the left.  Pulmonary
vascular congestion right lung with patchy infiltrate or
appearance.  Question pulmonary edema versus infectious infiltrate.
IMPRESSION: Right mainstem bronchus intubation with complete collapse left lung
and mediastinal shift towards left.  Endotracheal tube tip needs to
be retracted by 4 cm.

Patchy consolidation / pulmonary vascular congestion right lung.
This may represent pulmonary edema although infectious infiltrate
cannot be excluded.

Critical test results telephoned to [facility] patient's nurse at the

## 2010-08-08 IMAGING — CT CT ANGIO CHEST
1 of 5 series · 17 of 30 positions shown · IV contrast (APPLIED)
Comparison: 05/18/2008.
COMPARISON: Portable chest obtained earlier today.

CLINICAL DATA: Altered mental status.  Hypertension.  Diabetes.
Hypoxia.

CT HEAD WITHOUT CONTRAST
TECHNIQUE: Contiguous axial images were obtained from the base of
the skull through the vertex without contrast.
CLINICAL DATA: Hypoxia.  Dialysis patient.
CT ANGIOGRAPHY CHEST
TECHNIQUE: Multidetector CT imaging of the chest was performed
using the standard protocol during bolus administration of
intravenous contrast. Multiplanar CT image reconstructions
including MIPs were obtained to evaluate the vascular anatomy.
Contrast: 100 ml [id number]-[id number]

[Series 12: pulm embolism 1.0 b25f thins · axial · 0.63mm/px · z∈[+698,+934]mm · 17 of 268 slices shown]
[im 16/268  lung]
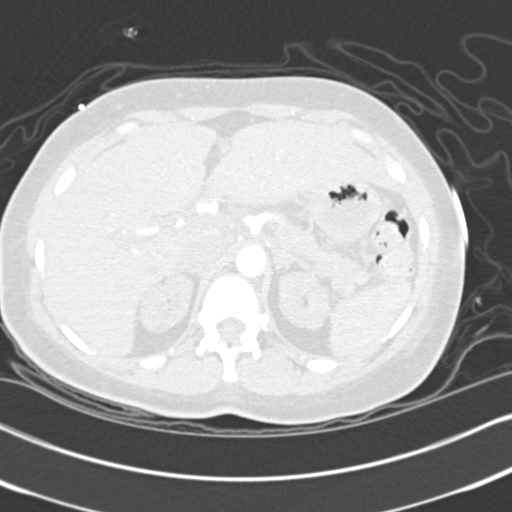
[im 32/268  mediastinal]
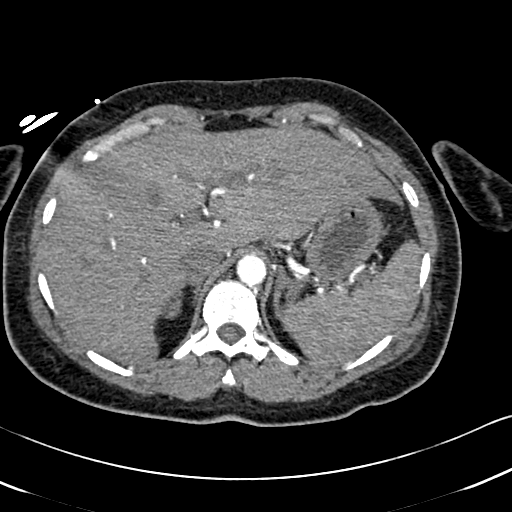
[im 48/268  lung]
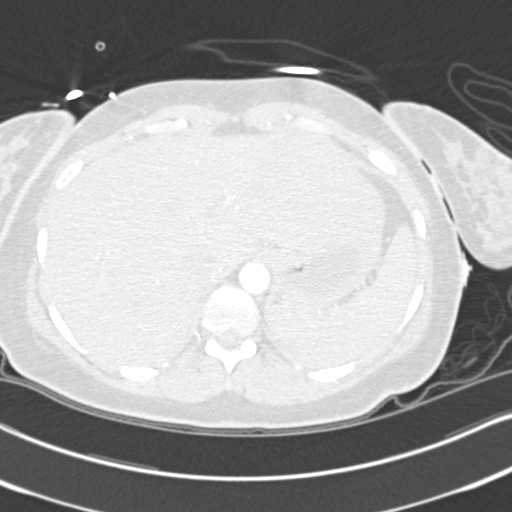
[im 63/268  mediastinal]
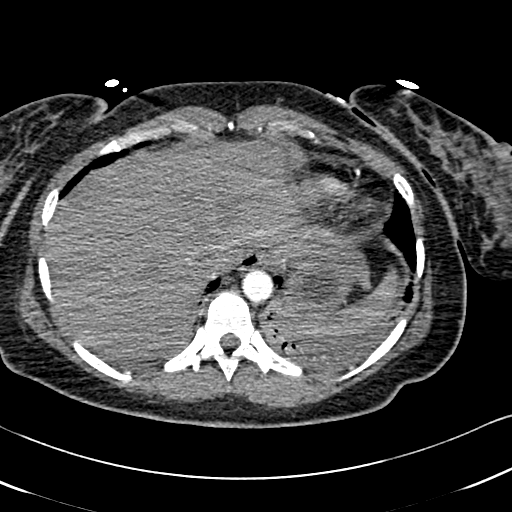
[im 79/268  lung]
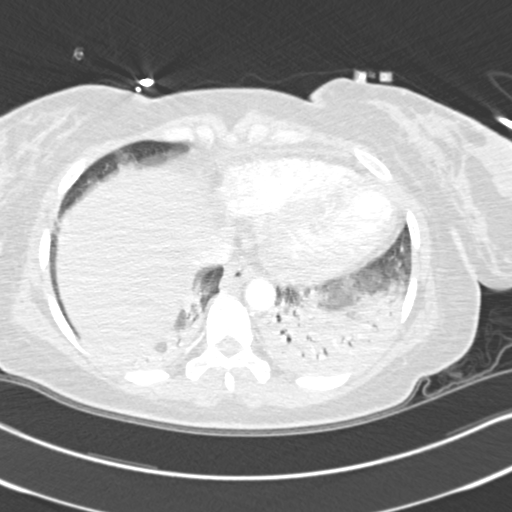
[im 95/268  mediastinal]
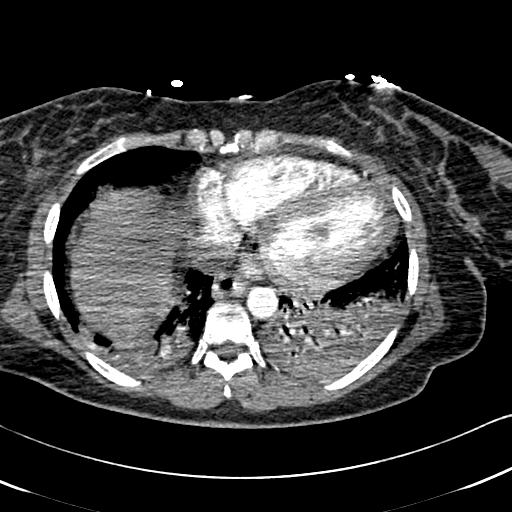
[im 110/268  lung]
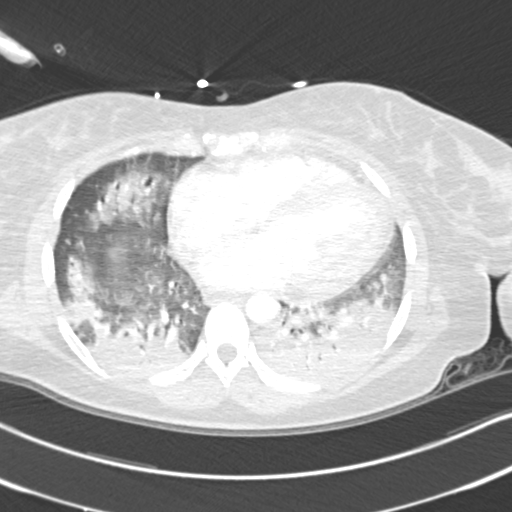
[im 126/268  mediastinal]
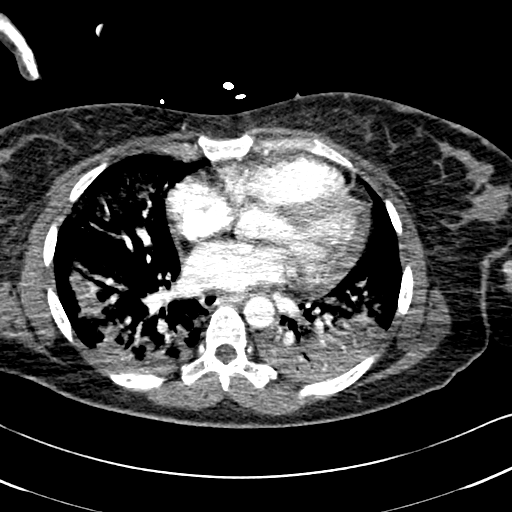
[im 142/268  lung]
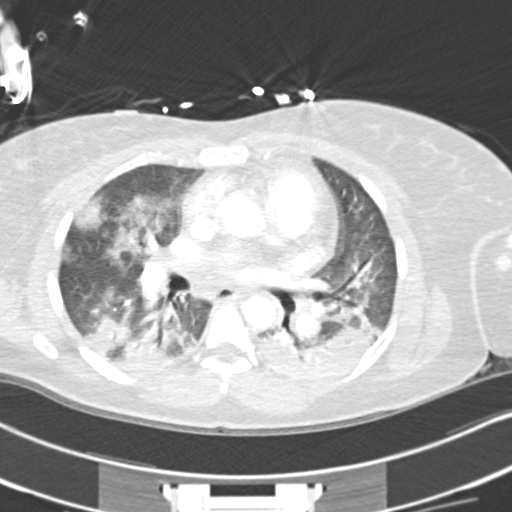
[im 158/268  mediastinal]
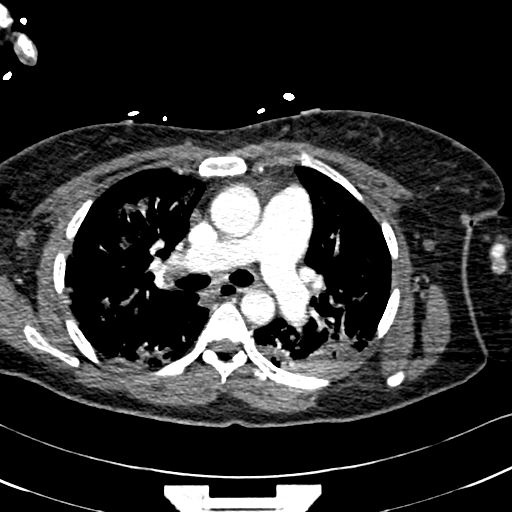
[im 173/268  lung]
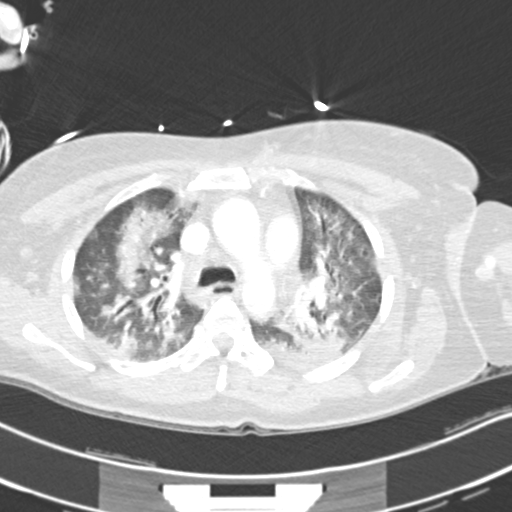
[im 189/268  mediastinal]
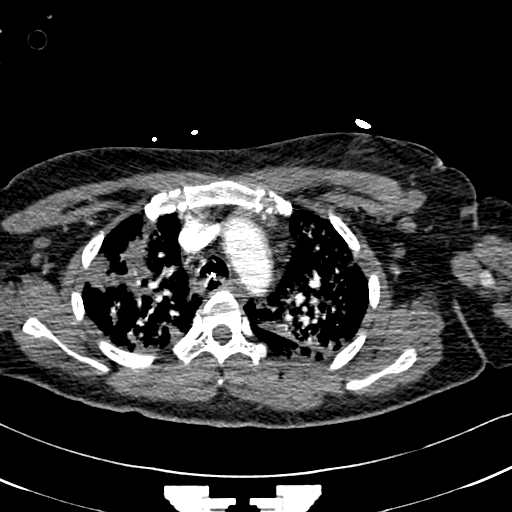
[im 205/268  lung]
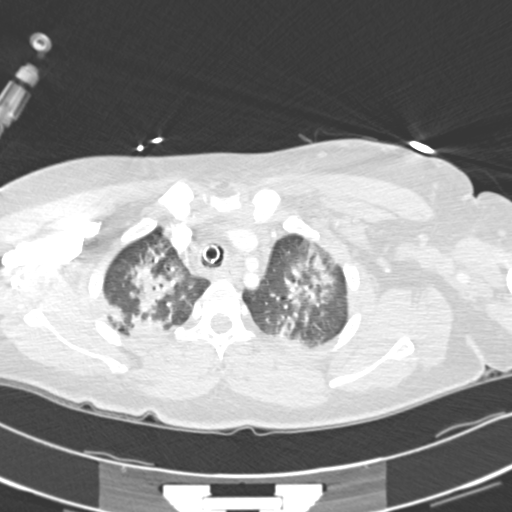
[im 220/268  mediastinal]
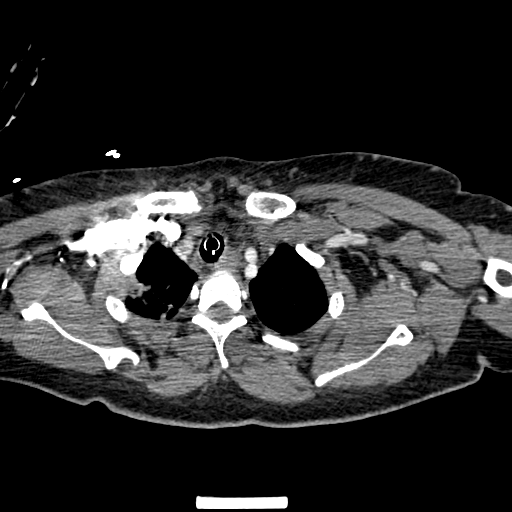
[im 236/268  lung]
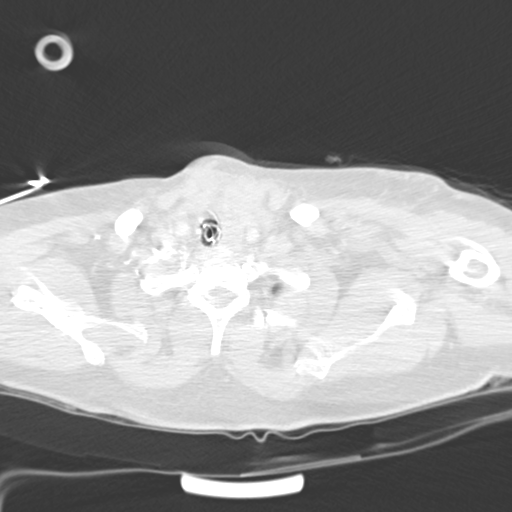
[im 250/268  mediastinal]
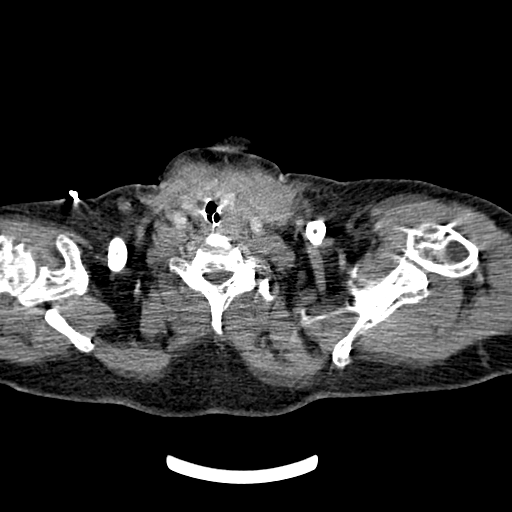
[im 252/268  lung]
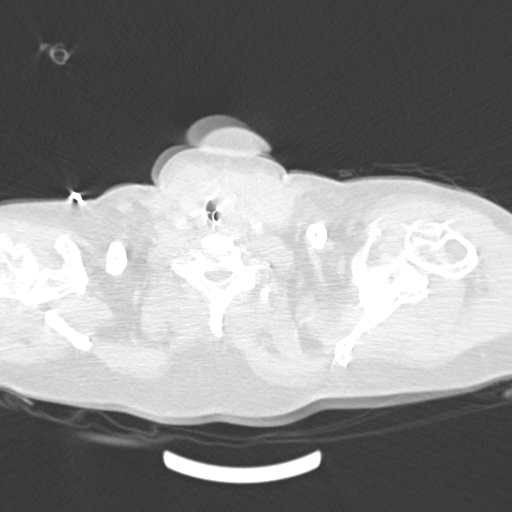

[17 of 30 positions shown; findings below may reference images not displayed]

FINDINGS: Normal appearing cerebral hemispheres and posterior fossa
structures.  Normal size and position of the ventricles.  No
intracranial hemorrhage, mass or evidence of acute infarction.
Unremarkable bones.  Interval mild bilateral sphenoid, right
ethmoid and right maxillary sinus mucosal thickening.
IMPRESSION: Mild chronic sinusitis, as described above.  Otherwise, normal
examination.
FINDINGS: The endotracheal tube tip 1.5 cm above the carina.
Normally opacified pulmonary arteries with no pulmonary arterial
filling defects seen.  Enlarged heart.  Extensive patchy and
confluent airspace opacity in both lungs.  No pleural fluid seen.
Cholecystectomy clips.  Unremarkable bones.
IMPRESSION: 1.  No pulmonary emboli seen.
2.  Extensive bilateral pneumonia or alveolar edema.
3.  Bilateral lower lobe atelectasis.
4.  Cardiomegaly.

## 2010-08-08 IMAGING — CR DG CHEST 1V PORT
1 series · 2 of 2 positions shown · non-contrast
Comparison: Yesterday.

CLINICAL DATA: Hypoxia.  Respiratory failure.  Dialysis patient.

PORTABLE CHEST - 1 VIEW

[Series 1: AP · 0.16mm/px · 2 of 2 slices shown]
[im 1/2]
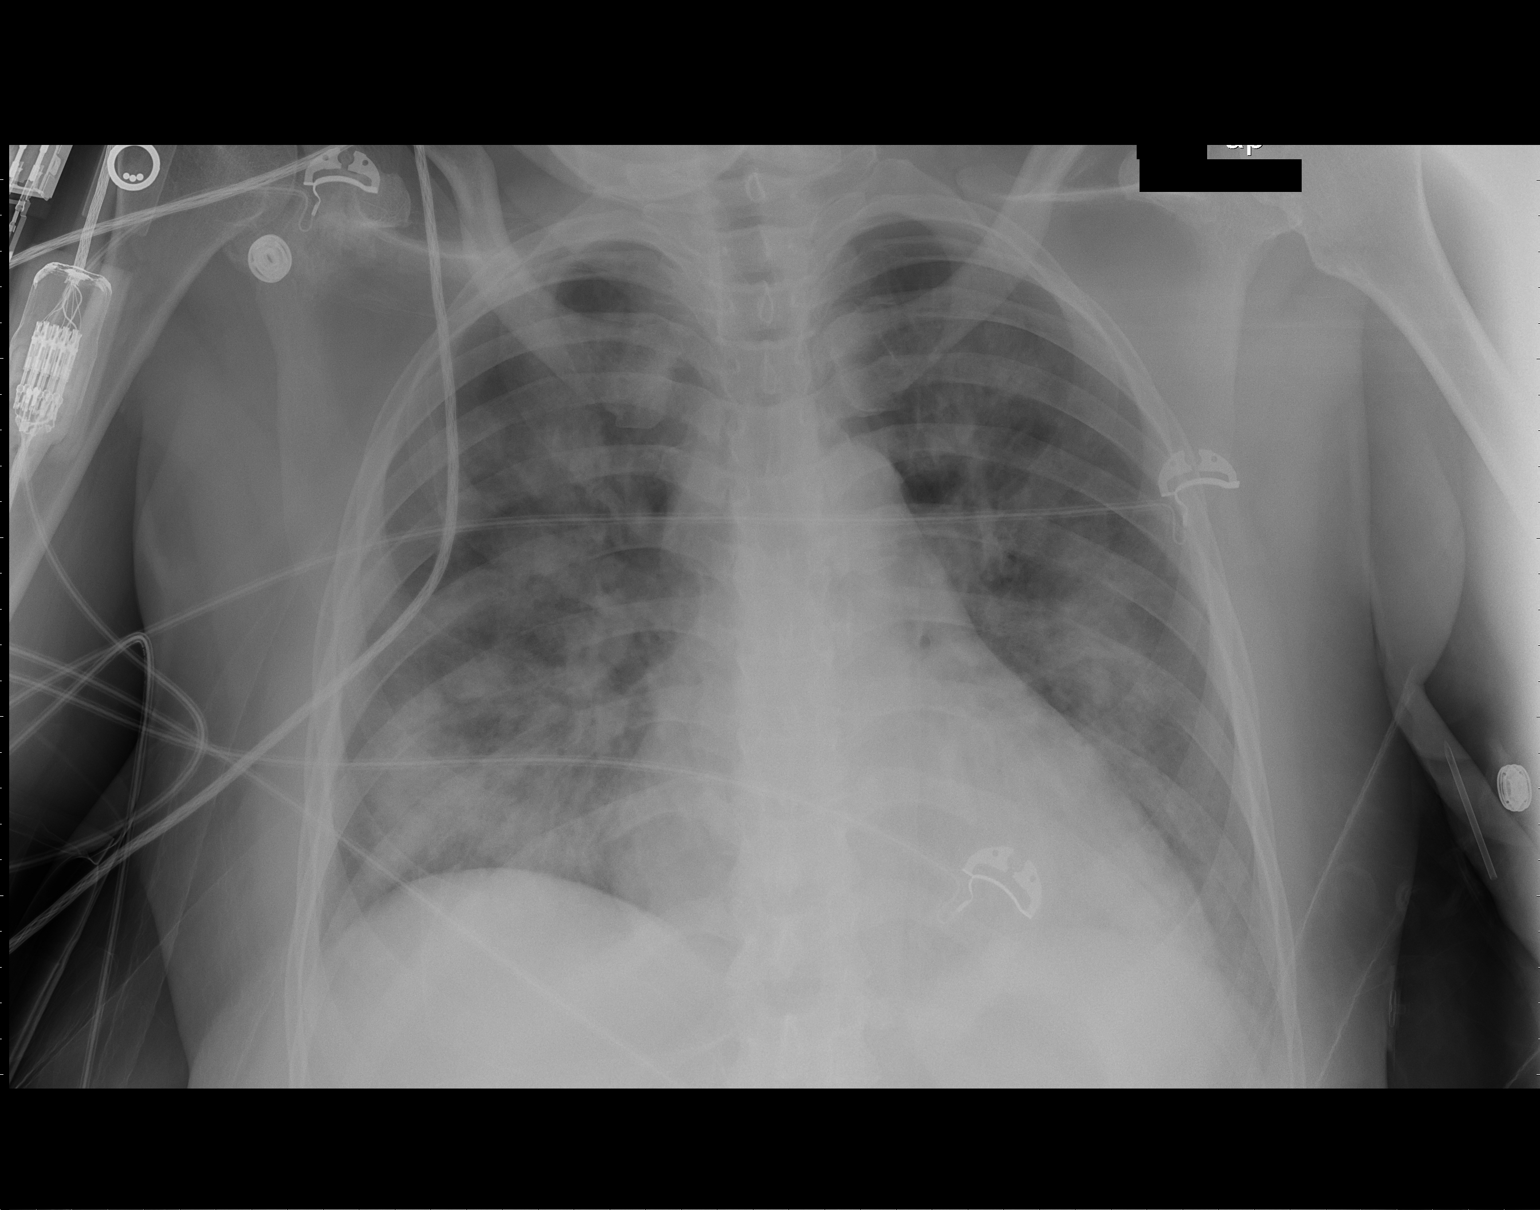
[im 2/2]
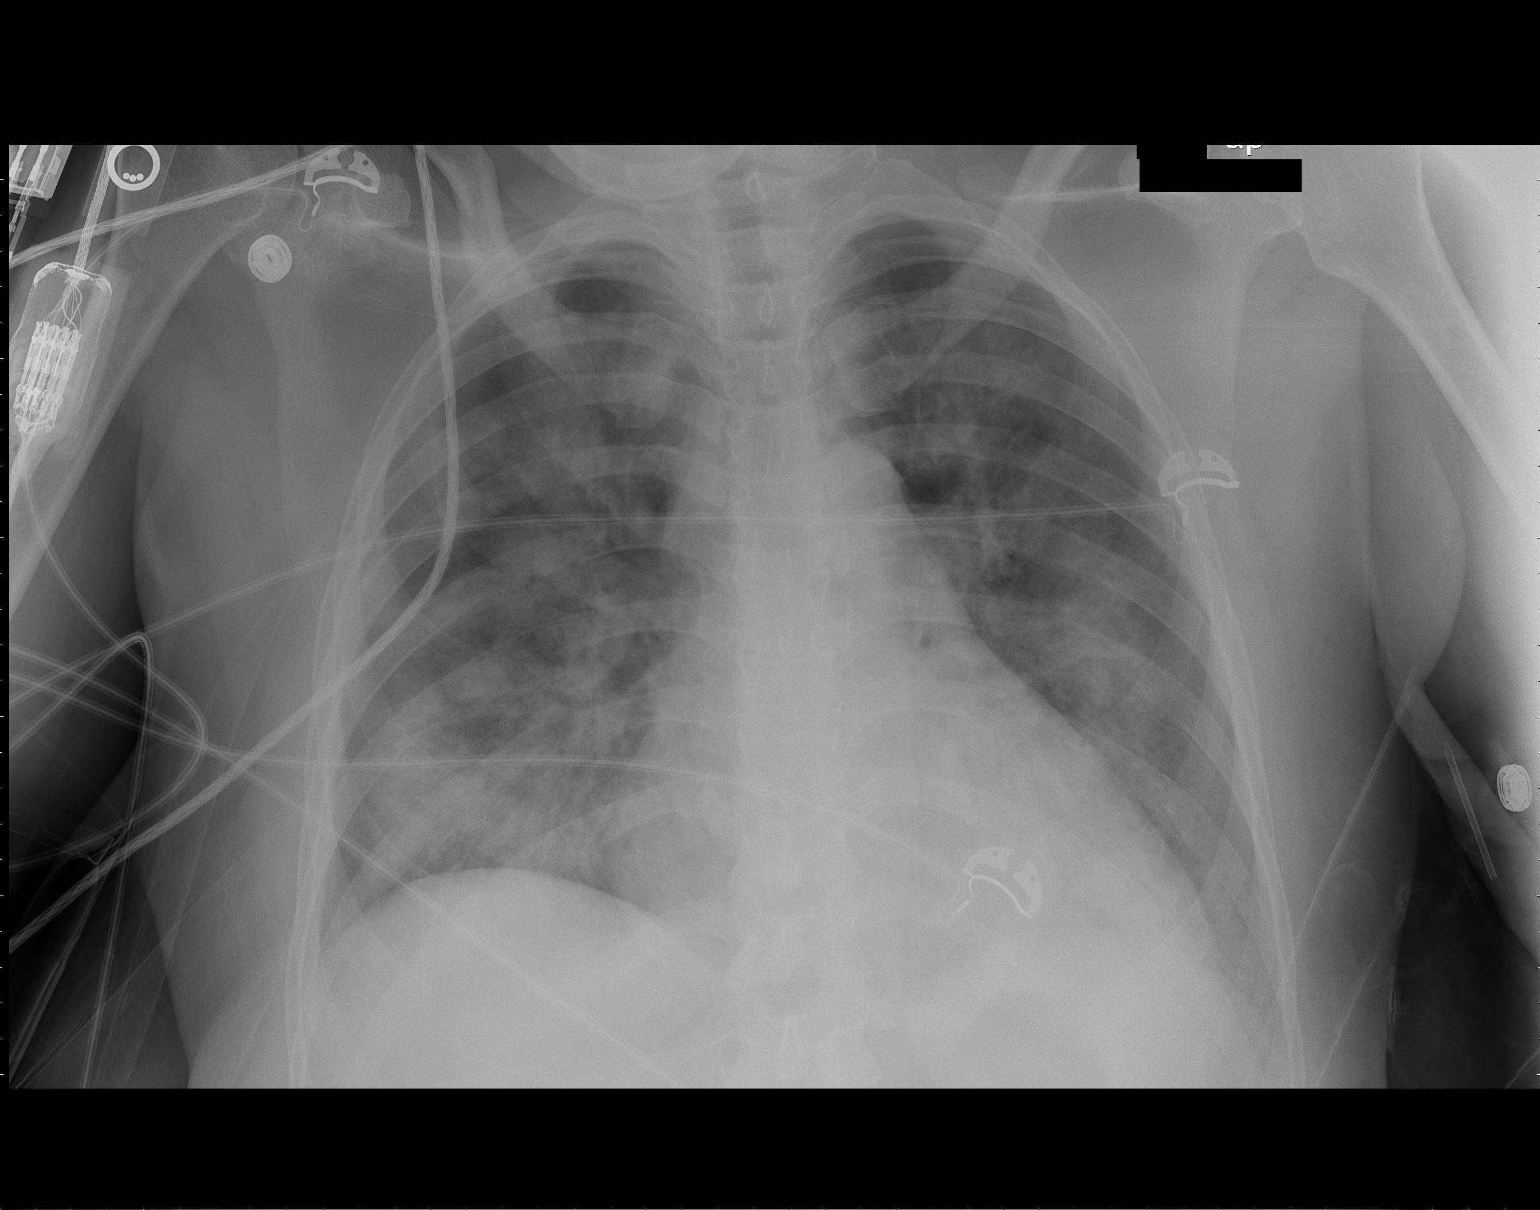

[2 of 2 positions shown; findings below may reference images not displayed]

FINDINGS: The cardiac silhouette remains enlarged.  No significant
change in extensive patchy airspace opacity in both lungs.
Possible minimal right pleural fluid.  Unremarkable bones.
IMPRESSION: 1.  No significant change in extensive bilateral pneumonia or
alveolar edema.
2.  Probable minimal right pleural effusion.
3.  Stable cardiomegaly.

## 2010-08-09 IMAGING — CR DG CHEST 1V PORT
1 series · 1 of 1 positions shown · non-contrast
Comparison: 07/17/2008

CLINICAL DATA: Hypoxia, respiratory failure, ventilatory support

PORTABLE CHEST - 1 VIEW

[AP]
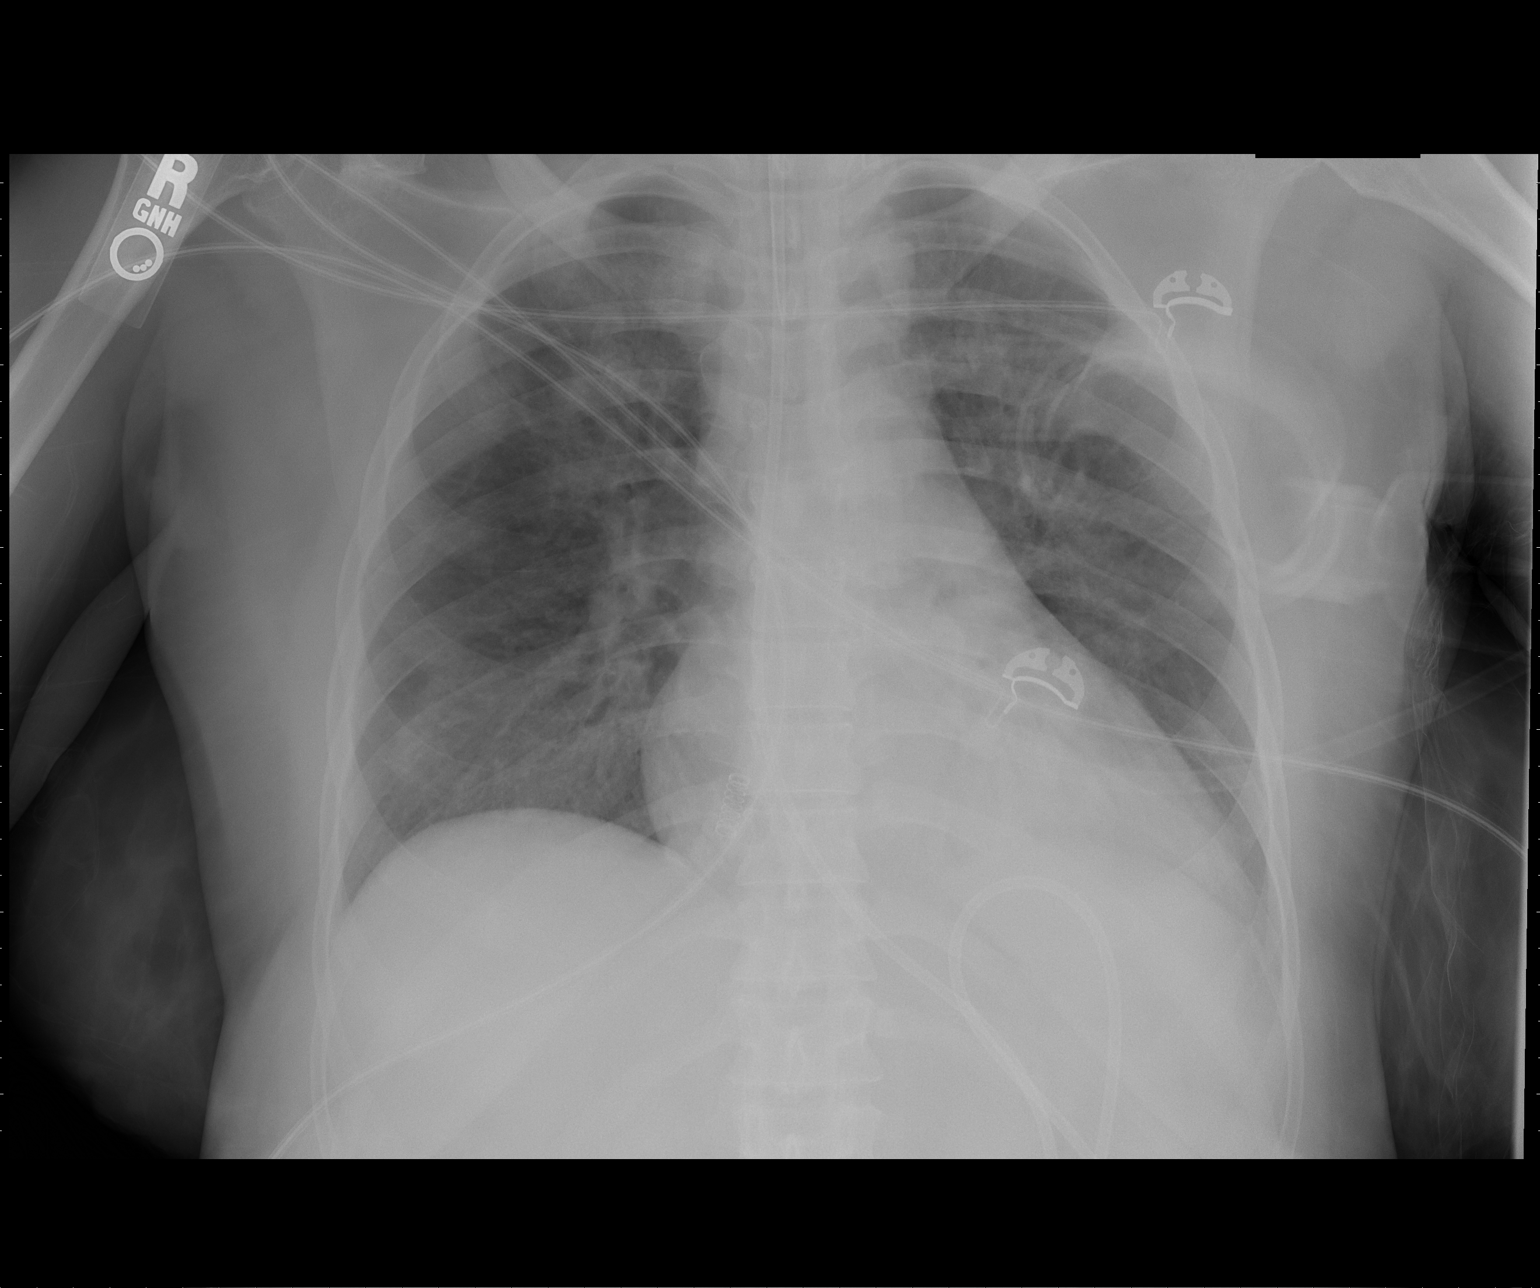

[1 of 1 positions shown; findings below may reference images not displayed]

FINDINGS: Endotracheal tube 6 cm above the carina.  Patchy
bilateral airspace disease, with left lower lobe consolidation.
Pattern is consistent with patchy bilateral pneumonia versus
alveolar edema.  No large effusion or pneumothorax.  Feeding tube
is coiled the stomach.  Aeration has improved compared to
07/17/2008.
IMPRESSION: Some improvement in bilateral patchy airspace disease versus
alveolar edema.

## 2010-08-10 LAB — BASIC METABOLIC PANEL WITH GFR
BUN: 24 mg/dL — ABNORMAL HIGH (ref 6–23)
Calcium: 9.1 mg/dL (ref 8.4–10.5)
GFR calc non Af Amer: 5 mL/min — ABNORMAL LOW (ref >60)
Glucose, Bld: 181 mg/dL — ABNORMAL HIGH (ref 70–99)
Potassium: 3.5 meq/L (ref 3.5–5.1)
Sodium: 135 meq/L (ref 135–145)

## 2010-08-10 LAB — CARDIAC PANEL(CRET KIN+CKTOT+MB+TROPI)
CK, MB: 0.6 ng/mL (ref 0.3–4.0)
Relative Index: INVALID (ref 0.0–2.5)
Relative Index: INVALID (ref 0.0–2.5)
Troponin I: 0.03 ng/mL (ref 0.00–0.06)

## 2010-08-10 LAB — DIFFERENTIAL
Basophils Absolute: 0 K/uL (ref 0.0–0.1)
Basophils Relative: 0 % (ref 0–1)
Eosinophils Absolute: 0.1 10*3/uL (ref 0.0–0.7)
Eosinophils Relative: 2 % (ref 0–5)
Lymphocytes Relative: 18 % (ref 12–46)
Lymphs Abs: 1.3 10*3/uL (ref 0.7–4.0)
Monocytes Absolute: 0.8 K/uL (ref 0.1–1.0)
Monocytes Relative: 11 % (ref 3–12)
Neutro Abs: 5 K/uL (ref 1.7–7.7)
Neutrophils Relative %: 69 % (ref 43–77)

## 2010-08-10 LAB — BASIC METABOLIC PANEL
BUN: 12 mg/dL (ref 6–23)
CO2: 27 mEq/L (ref 19–32)
Chloride: 97 mEq/L (ref 96–112)
Chloride: 98 mEq/L (ref 96–112)
Creatinine, Ser: 10.05 mg/dL — ABNORMAL HIGH (ref 0.4–1.2)
Creatinine, Ser: 5.03 mg/dL — ABNORMAL HIGH (ref 0.4–1.2)
Creatinine, Ser: 9.01 mg/dL — ABNORMAL HIGH (ref 0.4–1.2)
GFR calc Af Amer: 12 mL/min — ABNORMAL LOW (ref >60)
GFR calc Af Amer: 5 mL/min — ABNORMAL LOW (ref >60)
GFR calc Af Amer: 6 mL/min — ABNORMAL LOW (ref >60)
GFR calc non Af Amer: 10 mL/min — ABNORMAL LOW (ref >60)
GFR calc non Af Amer: 4 mL/min — ABNORMAL LOW (ref >60)
Potassium: 3.8 mEq/L (ref 3.5–5.1)

## 2010-08-10 LAB — PREGNANCY, URINE: Preg Test, Ur: NEGATIVE

## 2010-08-10 LAB — URINE CULTURE: Culture  Setup Time: 201109020026

## 2010-08-10 LAB — URINALYSIS, ROUTINE W REFLEX MICROSCOPIC
Bilirubin Urine: NEGATIVE
Glucose, UA: 100 mg/dL — AB
Ketones, ur: NEGATIVE mg/dL
Protein, ur: >300 mg/dL — AB

## 2010-08-10 LAB — RENAL FUNCTION PANEL
CO2: 25 mEq/L (ref 19–32)
Calcium: 9.2 mg/dL (ref 8.4–10.5)
Chloride: 100 mEq/L (ref 96–112)
GFR calc Af Amer: 6 mL/min — ABNORMAL LOW (ref >60)
GFR calc non Af Amer: 5 mL/min — ABNORMAL LOW (ref >60)
Potassium: 4 mEq/L (ref 3.5–5.1)
Sodium: 139 mEq/L (ref 135–145)

## 2010-08-10 LAB — CBC
HCT: 35.5 % — ABNORMAL LOW (ref 36.0–46.0)
Hemoglobin: 10.9 g/dL — ABNORMAL LOW (ref 12.0–15.0)
Hemoglobin: 11 g/dL — ABNORMAL LOW (ref 12.0–15.0)
MCH: 30.7 pg (ref 26.0–34.0)
MCHC: 30.7 g/dL (ref 30.0–36.0)
MCV: 100 fL (ref 78.0–100.0)
MCV: 99.3 fL (ref 78.0–100.0)
Platelets: 233 10*3/uL (ref 150–400)
Platelets: 234 10*3/uL (ref 150–400)
Platelets: 254 10*3/uL (ref 150–400)
RBC: 3.05 MIL/uL — ABNORMAL LOW (ref 3.87–5.11)
RBC: 3.53 MIL/uL — ABNORMAL LOW (ref 3.87–5.11)
RBC: 3.55 MIL/uL — ABNORMAL LOW (ref 3.87–5.11)
RDW: 15.9 % — ABNORMAL HIGH (ref 11.5–15.5)
RDW: 16 % — ABNORMAL HIGH (ref 11.5–15.5)
WBC: 6.3 10*3/uL (ref 4.0–10.5)
WBC: 6.5 10*3/uL (ref 4.0–10.5)
WBC: 7.3 K/uL (ref 4.0–10.5)
WBC: 8 10*3/uL (ref 4.0–10.5)

## 2010-08-10 LAB — CULTURE, BLOOD (ROUTINE X 2)
Culture: NO GROWTH
Culture: NO GROWTH

## 2010-08-10 LAB — HEPATITIS B SURFACE ANTIGEN: Hepatitis B Surface Ag: NEGATIVE

## 2010-08-10 LAB — PROTIME-INR
INR: 1.06 (ref 0.00–1.49)
Prothrombin Time: 14 seconds (ref 11.6–15.2)

## 2010-08-10 LAB — POCT CARDIAC MARKERS
CKMB, poc: <1 ng/mL — ABNORMAL LOW (ref 1.0–8.0)
Myoglobin, poc: 163 ng/mL (ref 12–200)
Troponin i, poc: <0.05 ng/mL (ref 0.00–0.09)

## 2010-08-10 LAB — URINE MICROSCOPIC-ADD ON

## 2010-08-10 LAB — TROPONIN I: Troponin I: 0.04 ng/mL (ref 0.00–0.06)

## 2010-08-10 LAB — CK TOTAL AND CKMB (NOT AT ARMC)
CK, MB: 1.1 ng/mL (ref 0.3–4.0)
Relative Index: INVALID (ref 0.0–2.5)

## 2010-08-10 LAB — WET PREP, GENITAL: Yeast Wet Prep HPF POC: NONE SEEN

## 2010-08-10 LAB — GC/CHLAMYDIA PROBE AMP, GENITAL: Chlamydia, DNA Probe: NEGATIVE

## 2010-08-10 IMAGING — CR DG CHEST 1V PORT
1 series · 1 of 1 positions shown · non-contrast
Comparison: 07/19/2008 at 2802 hours

CLINICAL DATA: ET tube repositioning

PORTABLE CHEST - 1 VIEW

[view not recorded]
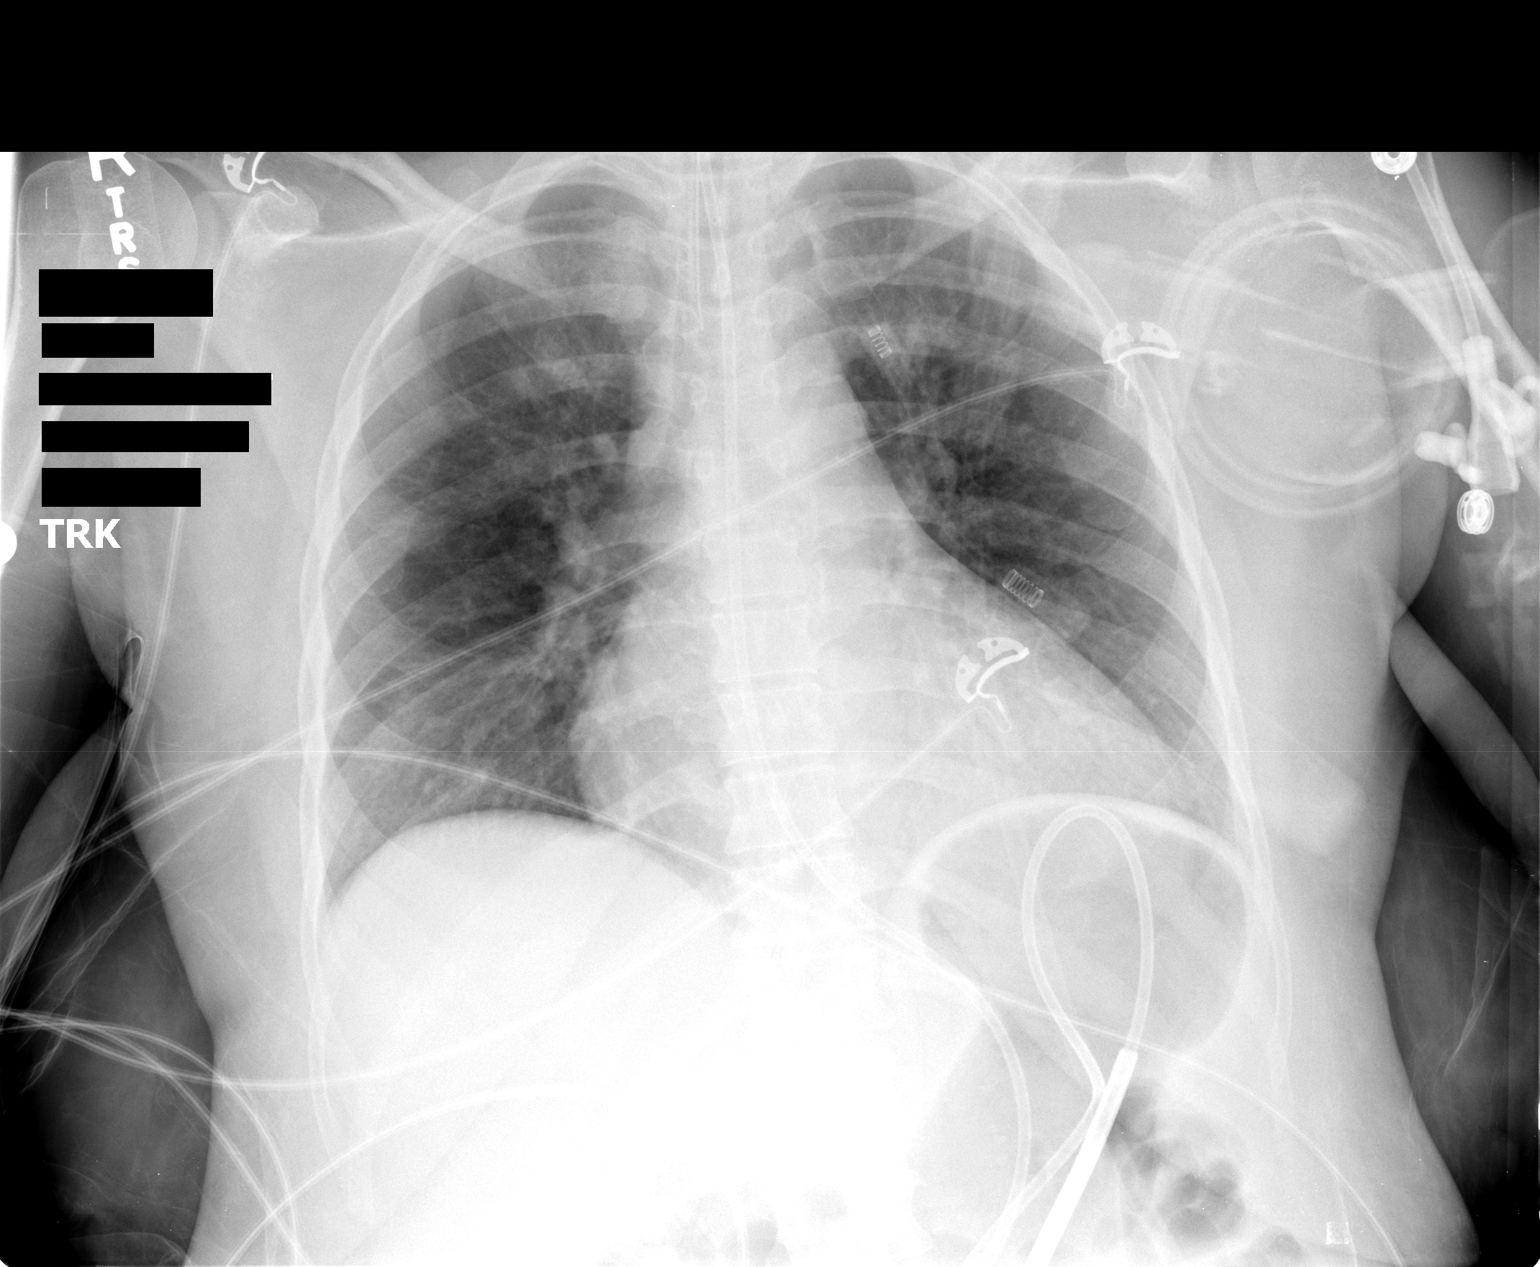

[1 of 1 positions shown; findings below may reference images not displayed]

FINDINGS: ET tube was then advanced to approximately 1.8 cm above
the carina.  Heart remains enlarged without congestive heart
failure.  Bilateral lung aeration improved.

Feeding tube coiled in the stomach.
IMPRESSION: 1.  ET tube placed to about 1.8 cm above the carina.
2.  Lung aeration improved.

## 2010-08-10 NOTE — Discharge Summary (Signed)
[name], [name]               ACCOUNT NO.:  [id number]  MEDICAL RECORD NO.:  [id number]           PATIENT TYPE:  I  LOCATION:  6711                         FACILITY:  [facility]  PHYSICIAN:  [name] Koch, MD        DATE OF BIRTH:  March 27, 1971  DATE OF ADMISSION:  08-06-10 DATE OF DISCHARGE:  08/07/2010                              DISCHARGE SUMMARY   DISCHARGE DIAGNOSES: 1. Pantoea agglomerans group G bacteremia. 2. Brittle type 1 diabetes mellitus. 3. End-stage renal disease Tuesday, Thursday, Saturday, dialysis     candidate. 4. Anemia of chronic disease. 5. Moderate control hypertension. 6. Status post renal pancreatic transplant, on immunosuppressive     therapy. 7. Psychiatric diagnosis as of unclear.  DISCHARGE MEDICATIONS: 1. Fentanyl 5mg  1 tablet daily. 2. Ambien 10 mg 1 tablet at bedtime. 3. Clonazepam 2 mg b.i.d. 4. Toprol XL 100 mg every other day on days of dialysis. 5. Norvasc 10 mg every other day 10 mg on day of dialysis. 6. CellCept 250 mg 4 tablets b.i.d. 7. Prograf 1 mg 3 tablets b.i.d. 8. Aspirin 81 mg daily. 9. Phenergan 25 mg 1 tablet b.i.d. p.r.n. 10.Calcium acetate 667 three tablets t.i.d. with meals.  NEW MEDICATIONS: 1. Calcium carbonate 500 mg q.6 h. 2. NovoLog insulin 5/10 units t.i.d. with meals. 3. Ceftazidime 2 g Tuesday, Thursday, Saturday after dialysis.  She is     to complete 3 more doses. 4. The patient will also get her Nephro-Vite at bedtime. 5. The patient will receive IV paricalcitol 5 mcg Tuesday, Thursday,     Saturday per mL 1 mcg with dialysis. 6. Aranesp IV 200 mcg Tuesday with hemodialysis.  IMAGING STUDIES:  Pertinent imaging studies; 1. Two-view chest x-ray 08/06/2010 showed stable cardiomegaly, no     acute finding. 2. Ultrasound-guided catheter was placed August 02, 2010. 3. Two-view chest x-ray showed no acute abnormality August 04, 2010.  HOSPITAL COURSE:  Please see full dictation by Dr. Toniann Fail, dated 06-Aug-2022 2012, job number [id number].  Briefly, this is a 40 year old female with history of end-stage renal disease, on hemodialysis Tuesday, Thursday and Saturday type 1 diabetes, status post pancreatic transplant, history of renal transplant, history of hypertension experiencing weakness for the past 2 days, weakness started after mom's death, been worsen over the past 3-4 days (mother died 2-3 weeks ago). The patient is having some flu like symptoms including cough and chest congestion.  No specific chest pain, does not have exertional symptoms. Denies any nausea, vomiting, abdominal pain.  She has been experiencing generalized all over including scalp.  Denies any fever, chills, dizziness, loss of consciousness, any focal deficits, dysuria, discharge, diarrhea.  She was found to have uncontrolled diabetes more than 400.  HOSPITAL COURSE:  According to history; 1. Weakness congestion and flu-like symptoms.  PCR was done, but this     was negative.  It was thought initially that this could be     secondary to her bereavement for mother's recent death, however, it     was noted that her blood cultures grew Pantoea agglomerans, which  she was placed on Fortaz.  This recommends that usually is an     opportunistic infection, however, the patient will need to be on a     total of 14 days of antibiotics.  In view of her hemodialysis     state, the patient will need to stay on this for 3 more doses after     dialysis.  I have arranged for home health to do this with dialysis     and dialysis center has been notified per Nephrology who I spoke     with today.  The patient will need to complete course of     antibiotics. 2. Brittle diabetes mellitus.  Her blood sugars were very erratic     during hospitalization going as low as 14 and as high as 400, hence     I am placing only on sliding-scale insulin 5-10 units.  The patient     is aware of and can give herself insulin.  She will need to      continue on low-dose insulin at home and review with her outpatient     physician for this management.  It is unlikely that she should be     on oral medications, however, I will leave this up to Nephrology as     an outpatient as well as her primary care physician to determine if     metformin after dialysis would be reasonable option. 3. End-stage renal disease, on hemodialysis Tuesday, Thursday and     Saturday.  The patient was seen by Renal and the patient did     receive hemodialysis during hospitalization here.  She will     continue with outpatient dialysis.  Her baseline creatinine is     anywhere from 6 to 8. 4. Anemia of chronic disease.  The patient was placed on Aranesp and     she will continue these as an outpatient.  She will also need to     continue paricalcitol and other meds as per Renal. 5. Hypertension.  The patient will continue amlodipine 10 and     metoprolol XL 100 at bedtime on nondialysis days. 6. Status post renal pancreatic transplant.  The patient will continue     tacrolimus and Prograf and CellCept as an outpatient. 7. Depression.  The patient was counseled for depression, however, did     not appear to need any further counseling.  The patient was stable     for discharge.  Vital signs on day of discharge were temperature 98.3, pulse 84, respirations 18, blood pressure 103-121 over 66-71, satting 100% on room air.  She is have a second set of blood cultures done because she had slight fever when we tried to transition off IV antibiotics.  However, this is still negative at 2 days.  This can be followed up as an outpatient. The patient discharged home in stable state.          ______________________________ [name] Koch, MD     JS/MEDQ  D:  08/07/2010  T:  08/07/2010  Job:  [id number]  cc:   Candyce Churn. Allyne Gee, M.D. Duke Salvia Eliott Nine, M.D.  Electronically Signed by [name] Koch MD on 08/10/2010 04:51:23 PM

## 2010-08-11 LAB — SURGICAL PCR SCREEN: Staphylococcus aureus: POSITIVE — AB

## 2010-08-11 LAB — POCT I-STAT 4, (NA,K, GLUC, HGB,HCT)
Potassium: 4.2 mEq/L (ref 3.5–5.1)
Sodium: 138 mEq/L (ref 135–145)

## 2010-08-11 LAB — GLUCOSE, CAPILLARY
Glucose-Capillary: 112 mg/dL — ABNORMAL HIGH (ref 70–99)
Glucose-Capillary: 131 mg/dL — ABNORMAL HIGH (ref 70–99)
Glucose-Capillary: 94 mg/dL (ref 70–99)

## 2010-08-11 IMAGING — CR DG CHEST 1V PORT
1 series · 1 of 1 positions shown · non-contrast
Comparison: Portable chest 07/19/2008.

CLINICAL DATA: Hypoxia, respiratory failure.

PORTABLE CHEST - 1 VIEW

[AP]
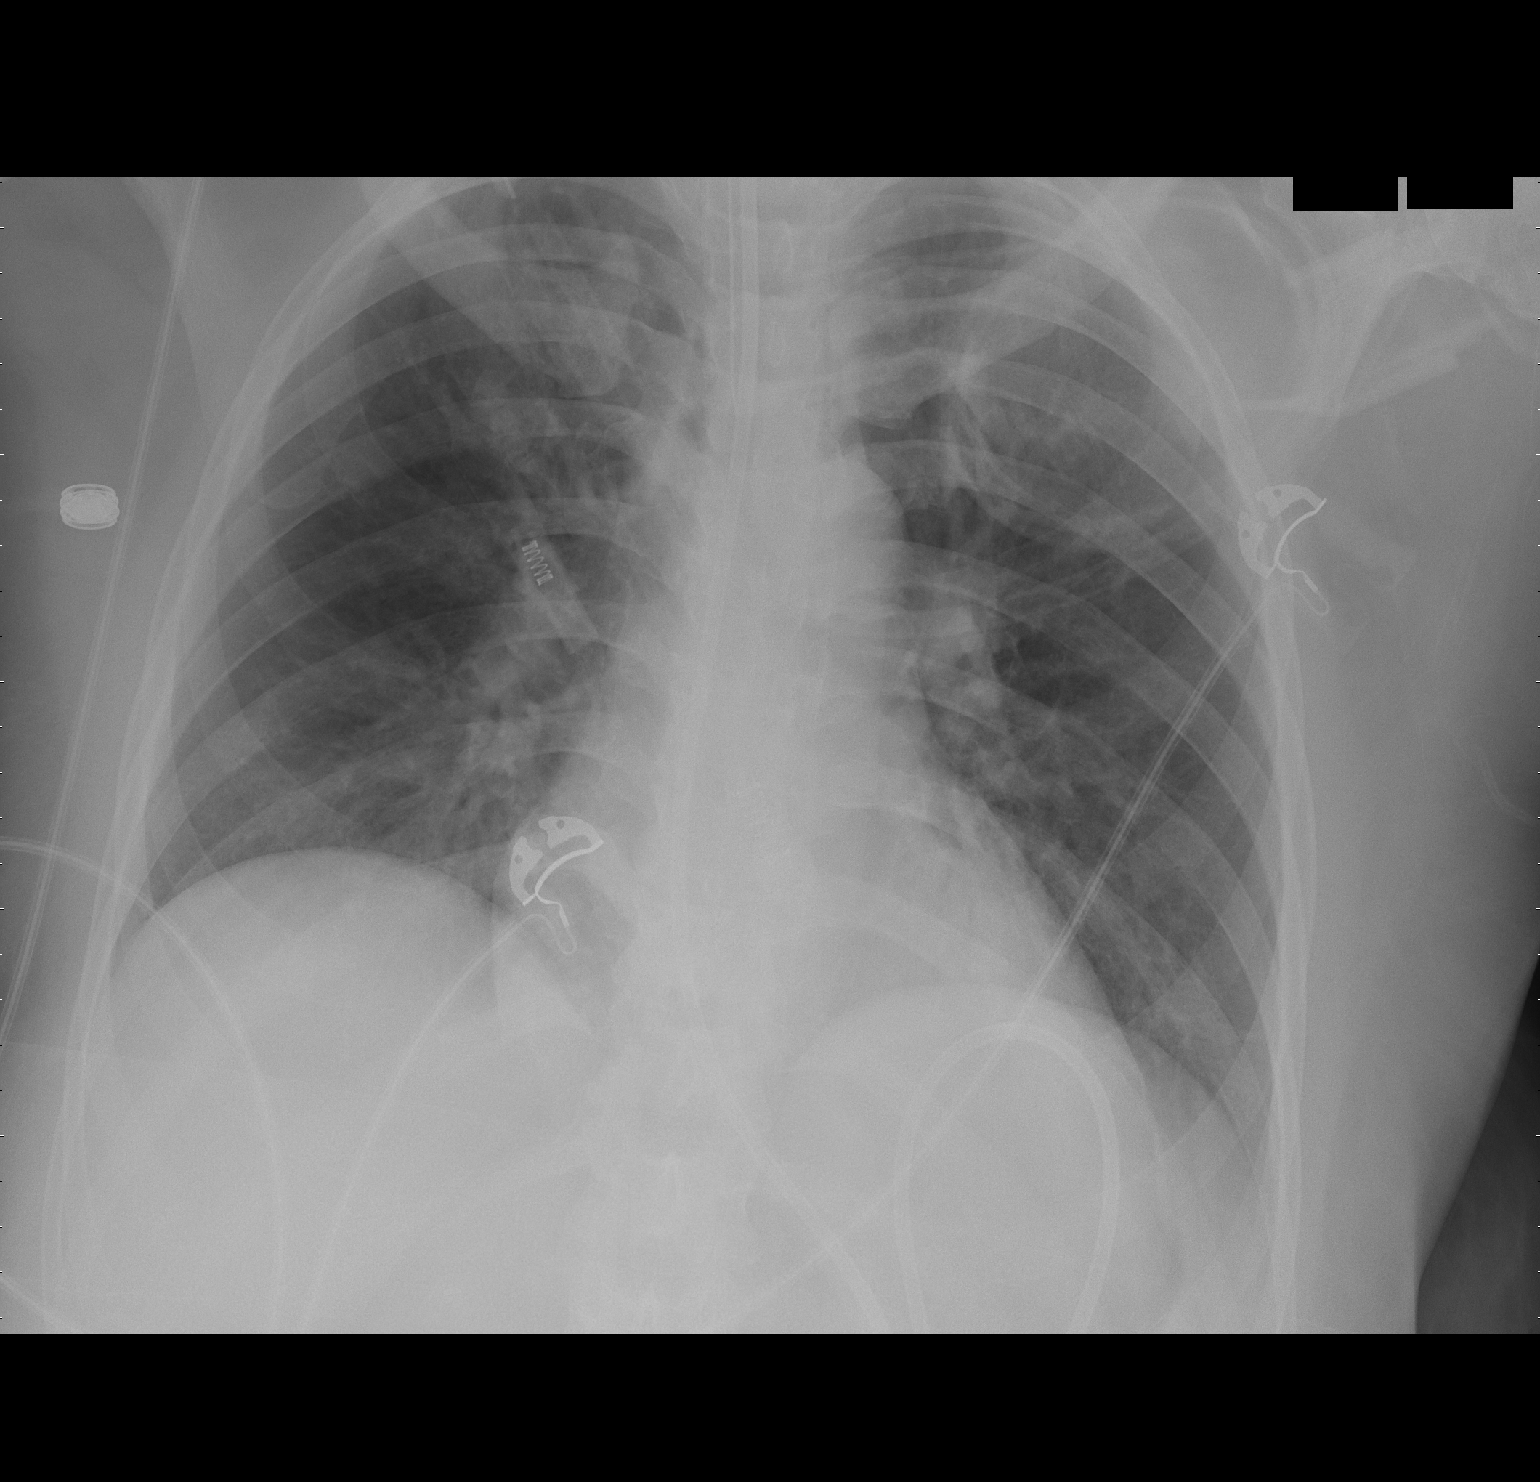

[1 of 1 positions shown; findings below may reference images not displayed]

FINDINGS: Feeding tube and endotracheal tube remain in place.
Feeding tube tip remains within the stomach. Lungs appear clear.
Heart size upper normal.  No effusion.
IMPRESSION: 1.  Feeding tube tip remains in the stomach.
2.  Lungs clear.

## 2010-08-12 LAB — CULTURE, BLOOD (ROUTINE X 2)
Culture  Setup Time: 201203110023
Culture: NO GROWTH

## 2010-08-14 LAB — POCT I-STAT, CHEM 8
HCT: 42 % (ref 36.0–46.0)
Hemoglobin: 14.3 g/dL (ref 12.0–15.0)
Sodium: 140 mEq/L (ref 135–145)
TCO2: 32 mmol/L (ref 0–100)

## 2010-08-14 LAB — CBC
MCHC: 32.3 g/dL (ref 30.0–36.0)
MCV: 100.8 fL — ABNORMAL HIGH (ref 78.0–100.0)
Platelets: 232 10*3/uL (ref 150–400)
WBC: 4.8 10*3/uL (ref 4.0–10.5)

## 2010-08-18 LAB — POCT I-STAT 4, (NA,K, GLUC, HGB,HCT)
Potassium: 4.9 mEq/L (ref 3.5–5.1)
Sodium: 138 mEq/L (ref 135–145)

## 2010-08-27 ENCOUNTER — Inpatient Hospital Stay (INDEPENDENT_AMBULATORY_CARE_PROVIDER_SITE_OTHER)
Admission: RE | Admit: 2010-08-27 | Discharge: 2010-08-27 | Disposition: A | Discharge status: Home / Self Care | Payer: Medicare Other | Source: Ambulatory Visit | Attending: Family Medicine | Admitting: Family Medicine

## 2010-08-27 DIAGNOSIS — G43009 Migraine without aura, not intractable, without status migrainosus: Secondary | ICD-10-CM

## 2010-08-28 NOTE — H&P (Signed)
NAMEDEWEY, NEUKAM NO.:  [id number]  MEDICAL RECORD NO.:  [id number]           PATIENT TYPE:  LOCATION:                                 FACILITY:  PHYSICIAN:  Eduard Clos, MDDATE OF BIRTH:  Mar 01, 1971  DATE OF ADMISSION: DATE OF DISCHARGE:                             HISTORY & PHYSICAL   PRIMARY CARE PHYSICIAN:  Robyn N. Allyne Gee, MD  PRIMARY NEPHROLOGIST:  Duke Salvia. Eliott Nine, MD  CHIEF COMPLAINT:  Generalized weakness.  HISTORY OF PRESENT ILLNESS:  A 40 year old female with history of ESRD on hemodialysis on Tuesday, Thursday, Saturday, diabetes mellitus type 1, status post pancreatic transplantation, history of renal transplant, history of hypertension, has been experiencing weakness over the last 2 weeks.  The patient states the weakness started off after her mom's death and has been worsening over the last 3-4 days.  The patient in addition has been having some flu-like symptoms including cough, phlegm, and chest congestion and does not have specifically any chest pain, does not have any exertional symptoms.  Denies any nausea, vomiting, or abdominal pain.  Has been experiencing some itching generalized all over the body including the scalp.  Denies any fever or chills.  Denies any dizziness, loss of consciousness, any focal deficit, any dysuria, discharges, or diarrhea.  In the ER, the patient at this time is admitted for symptoms and in addition she was found to have uncontrolled diabetes of more than 400.  There was no anion gap.  PAST MEDICAL HISTORY:  ESRD on hemodialysis on Tuesdays, Thursdays, Saturdays, she did not miss any dialysis.  Diabetes mellitus type 1, status post pancreatic transplant, is on oral hyperglycemic, and her glipizide was recently stopped a month ago by her primary care.  She is unable to site any specific reason for it.  History of hypertension.  PAST SURGICAL HISTORY:  Kidney and pancreatic transplant, surgery  for retinopathy, and AV graft placement to assess removal of the left thumb after it was infected and has an AV fistula in the right arm, has a history of cholecystectomy.  MEDICATIONS ON ADMISSION:  As per the patient, the patient is on: 1. Actos 45 mg daily. 2. Ambien 10 mg p.r.n. 3. Aspirin 81 mg daily. 4. CellCept 250 mg 4 tablets twice daily. 5. Glipizide has been stopped a month ago. 6. Norvasc 10 mg daily. 7. PhosLo 667 mg 3 tablets 3 times daily. 8. Prograf 3 mg twice daily. 9. Toprol-XL 100 mg daily. 10.Prednisone 5 mg daily. 11.Clonazepam 1 mg b.i.d. p.r.n. for anxiety.  ALLERGIES:  FLAGYL, MORPHINE, PENICILLIN, TRAMADOL, VICODIN, and ZOLOFT.  SOCIAL HISTORY:  The patient denies smoking cigarettes, drinking alcohol, using illegal drugs.  FAMILY HISTORY:  Significant for hypertension, diabetes, and coronary artery disease.  REVIEW OF SYSTEMS:  As noted in history of present illness, nothing else significant.  The patient in addition has complaints of some frontal headache which usually happens during a migraine and is typical of her migraine.  PHYSICAL EXAMINATION:  GENERAL:  The patient examined at bedside, not in acute distress. VITAL SIGNS:  Blood pressure 130/70, pulse 104, temperature 98.8, respirations  18, O2 sat 99%. HEENT:  Anicteric.  No pallor.  The patient has a left she states from a previous surgery.  There is no facial asymmetry.  Tongue is midline. NECK:  No neck rigidity. LUNGS:  She has bilateral air entry present.  No rhonchi.  No crepitation. HEART:  S1 and S2 heard. ABDOMEN:  Soft, nontender.  Bowel sounds present. CNS:  Alert, awake, oriented to time, place, and person.  Moves upper and lower extremities 5/5. EXTREMITIES:  Peripheral pulses felt.  No edema.  LABORATORY DATA:  EKG shows normal sinus rhythm with nonspecific ST-T changes.  Heart rate is around 95 beats per minute.  Chest x-ray shows stable cardiomegaly, no acute findings.   CBC, WBC 7.8, hemoglobin is 11.9, hematocrit is 35, last hemoglobin on December was 12.6, hematocrit 35, platelets 216,000.  Basic metabolic panel, sodium 126, potassium 3.4, chloride 90, carbon dioxide is 28, anion gap is 8, glucose is 464, BUN 26, creatinine 7.3, calcium 9.1.  ASSESSMENT: 1. Generalized weakness with flu-like symptoms and chest congestion. 2. Uncontrolled diabetes with history of type 1 diabetes status post     pancreatic transplant. 3. End-stage renal disease on hemodialysis on Tuesday, Thursday, and     Saturday. 4. Generalized itching. 5. History of hypertension. 6. Anemia.  PLAN: 1. At this time, admit the patient to telemetry. 2. For her generalized weakness and chest congestion and flu-like     symptoms, a flu PCR has been sent, and at this time the patient is     not having chest pain, but we will cycle cardiac markers, place the     patient on aspirin.  The patient's symptoms have actually started     after her mom's death.  This could be probably depression with     grief.  At this time, we will rule out any ACS and the patient also     has had a blood culture sent which we will follow.  We will also     follow the flu PCR. 3. Uncontrolled diabetes with history of type 1 diabetes mellitus     status post pancreatic transplant.  The patient has been taken off     glipizide a month ago.  We do not know the exact cause.  We need to     check with the PCP about this.  At this time, we are going to check     hemoglobin A1c.  I am going to give one dose of Lantus and continue     with Lantus at bedtime at 10 units and check CBG a.c. and h.s. with     sliding scale coverage. 4. At this time, I am going to continue her renal and pancreatic     transplant medications and her medicine has to be verified with the     pharmacist. 5. Further recommendation based on test orders and clinical course.     Eduard Clos, MD     ANK/MEDQ  D:  07/31/2010   T:  07/31/2010  Job:  [id number]  cc:   Duke Salvia. Eliott Nine, M.D. Candyce Churn. Allyne Gee, M.D.  Electronically Signed by Midge Minium MD on 08/28/2010 07:40:17 AM

## 2010-08-28 NOTE — H&P (Signed)
  NAMEKRISTYANA, [name] NO.:  [id number]  MEDICAL RECORD NO.:  [id number]           PATIENT TYPE:  LOCATION:                                 FACILITY:  PHYSICIAN:  Eduard Clos, MDDATE OF BIRTH:  July 04, 1970  DATE OF ADMISSION: DATE OF DISCHARGE:                             HISTORY & PHYSICAL   ADDENDUM:  The patient's blood sugar eventually was found to be 34.  She did get 10 units of NovoLog subcutaneously in the ER.  Repeat check again was 38, D50 has been given at this time.  The Lantus already has been discontinued and we will also hold off the Actos.  She needs to be closely monitored for her hypoglycemia.  At this time, I am going to check CBG q. half an hour for the next 4 times.  If she is persistently hypoglycemic, I will be starting D5W water.     Eduard Clos, MD     ANK/MEDQ  D:  07/31/2010  T:  07/31/2010  Job:  [id number]  Electronically Signed by Midge Minium MD on 08/28/2010 07:40:25 AM

## 2010-08-29 LAB — GLUCOSE, CAPILLARY: Glucose-Capillary: 90 mg/dL (ref 70–99)

## 2010-08-29 LAB — POCT I-STAT 4, (NA,K, GLUC, HGB,HCT)
Glucose, Bld: 106 mg/dL — ABNORMAL HIGH (ref 70–99)
Glucose, Bld: 80 mg/dL (ref 70–99)
HCT: 43 % (ref 36.0–46.0)
Hemoglobin: 16.7 g/dL — ABNORMAL HIGH (ref 12.0–15.0)
Potassium: 4.3 mEq/L (ref 3.5–5.1)

## 2010-08-29 LAB — POCT I-STAT, CHEM 8
BUN: 62 mg/dL — ABNORMAL HIGH (ref 6–23)
Calcium, Ion: 0.99 mmol/L — ABNORMAL LOW (ref 1.12–1.32)
Chloride: 105 mEq/L (ref 96–112)
Glucose, Bld: 69 mg/dL — ABNORMAL LOW (ref 70–99)

## 2010-08-29 LAB — COMPREHENSIVE METABOLIC PANEL
AST: 13 U/L (ref 0–37)
Albumin: 4.1 g/dL (ref 3.5–5.2)
Alkaline Phosphatase: 65 U/L (ref 39–117)
Chloride: 94 mEq/L — ABNORMAL LOW (ref 96–112)
Creatinine, Ser: 12.33 mg/dL — ABNORMAL HIGH (ref 0.4–1.2)
GFR calc Af Amer: 4 mL/min — ABNORMAL LOW (ref >60)
Potassium: 4.8 mEq/L (ref 3.5–5.1)
Total Bilirubin: 1 mg/dL (ref 0.3–1.2)
Total Protein: 7.9 g/dL (ref 6.0–8.3)

## 2010-08-29 LAB — DIFFERENTIAL
Basophils Absolute: 0 10*3/uL (ref 0.0–0.1)
Eosinophils Relative: 4 % (ref 0–5)
Lymphocytes Relative: 9 % — ABNORMAL LOW (ref 12–46)
Monocytes Absolute: 0.5 10*3/uL (ref 0.1–1.0)
Monocytes Relative: 5 % (ref 3–12)

## 2010-08-29 LAB — CBC
Platelets: 267 10*3/uL (ref 150–400)
WBC: 9 10*3/uL (ref 4.0–10.5)

## 2010-09-02 LAB — POCT I-STAT, CHEM 8
BUN: 45 mg/dL — ABNORMAL HIGH (ref 6–23)
BUN: 63 mg/dL — ABNORMAL HIGH (ref 6–23)
Calcium, Ion: 0.96 mmol/L — ABNORMAL LOW (ref 1.12–1.32)
Creatinine, Ser: 9.6 mg/dL — ABNORMAL HIGH (ref 0.4–1.2)
Creatinine, Ser: 9.7 mg/dL — ABNORMAL HIGH (ref 0.4–1.2)
Sodium: 135 mEq/L (ref 135–145)
TCO2: 23 mmol/L (ref 0–100)
TCO2: 26 mmol/L (ref 0–100)

## 2010-09-02 LAB — GLUCOSE, CAPILLARY
Glucose-Capillary: 120 mg/dL — ABNORMAL HIGH (ref 70–99)
Glucose-Capillary: 99 mg/dL (ref 70–99)

## 2010-09-02 LAB — POCT I-STAT 4, (NA,K, GLUC, HGB,HCT)
Glucose, Bld: 94 mg/dL (ref 70–99)
Hemoglobin: 13.9 g/dL (ref 12.0–15.0)
Potassium: 4.4 mEq/L (ref 3.5–5.1)
Sodium: 135 mEq/L (ref 135–145)

## 2010-09-02 LAB — HCG, SERUM, QUALITATIVE: Preg, Serum: NEGATIVE

## 2010-09-03 LAB — BASIC METABOLIC PANEL
BUN: 77 mg/dL — ABNORMAL HIGH (ref 6–23)
CO2: 16 mEq/L — ABNORMAL LOW (ref 19–32)
Chloride: 97 mEq/L (ref 96–112)
GFR calc non Af Amer: 2 mL/min — ABNORMAL LOW (ref >60)
Glucose, Bld: 100 mg/dL — ABNORMAL HIGH (ref 70–99)
Potassium: 5.3 mEq/L — ABNORMAL HIGH (ref 3.5–5.1)

## 2010-09-03 LAB — CBC
HCT: 39.8 % (ref 36.0–46.0)
MCHC: 32.2 g/dL (ref 30.0–36.0)
MCV: 100.4 fL — ABNORMAL HIGH (ref 78.0–100.0)
Platelets: 241 10*3/uL (ref 150–400)
RDW: 19.6 % — ABNORMAL HIGH (ref 11.5–15.5)

## 2010-09-03 LAB — DIFFERENTIAL
Basophils Absolute: 0 10*3/uL (ref 0.0–0.1)
Basophils Relative: 1 % (ref 0–1)
Eosinophils Absolute: 0.1 10*3/uL (ref 0.0–0.7)
Eosinophils Relative: 1 % (ref 0–5)
Monocytes Absolute: 0.6 10*3/uL (ref 0.1–1.0)

## 2010-09-03 LAB — POCT I-STAT 4, (NA,K, GLUC, HGB,HCT)
Hemoglobin: 15 g/dL (ref 12.0–15.0)
Potassium: 4.3 mEq/L (ref 3.5–5.1)
Sodium: 135 mEq/L (ref 135–145)

## 2010-09-06 LAB — POCT I-STAT 4, (NA,K, GLUC, HGB,HCT)
HCT: 51 % — ABNORMAL HIGH (ref 36.0–46.0)
Potassium: 3.2 mEq/L — ABNORMAL LOW (ref 3.5–5.1)
Sodium: 138 mEq/L (ref 135–145)

## 2010-09-06 LAB — GLUCOSE, CAPILLARY: Glucose-Capillary: 84 mg/dL (ref 70–99)

## 2010-09-07 ENCOUNTER — Inpatient Hospital Stay (HOSPITAL_COMMUNITY)
Admission: EM | Admit: 2010-09-07 | Discharge: 2010-09-20 | DRG: 689 | Disposition: A | Discharge status: Home / Self Care | Payer: Medicare Other | Attending: Internal Medicine | Admitting: Internal Medicine

## 2010-09-07 ENCOUNTER — Emergency Department (HOSPITAL_COMMUNITY): Payer: Medicare Other

## 2010-09-07 ENCOUNTER — Inpatient Hospital Stay (HOSPITAL_COMMUNITY): Payer: Medicare Other

## 2010-09-07 DIAGNOSIS — E1065 Type 1 diabetes mellitus with hyperglycemia: Secondary | ICD-10-CM | POA: Diagnosis present

## 2010-09-07 DIAGNOSIS — Z7982 Long term (current) use of aspirin: Secondary | ICD-10-CM

## 2010-09-07 DIAGNOSIS — D631 Anemia in chronic kidney disease: Secondary | ICD-10-CM | POA: Diagnosis present

## 2010-09-07 DIAGNOSIS — Z794 Long term (current) use of insulin: Secondary | ICD-10-CM

## 2010-09-07 DIAGNOSIS — Z9483 Pancreas transplant status: Secondary | ICD-10-CM

## 2010-09-07 DIAGNOSIS — Z940 Kidney transplant status: Secondary | ICD-10-CM

## 2010-09-07 DIAGNOSIS — Z79899 Other long term (current) drug therapy: Secondary | ICD-10-CM

## 2010-09-07 DIAGNOSIS — N2581 Secondary hyperparathyroidism of renal origin: Secondary | ICD-10-CM | POA: Diagnosis present

## 2010-09-07 DIAGNOSIS — R197 Diarrhea, unspecified: Secondary | ICD-10-CM | POA: Diagnosis present

## 2010-09-07 DIAGNOSIS — Z880 Allergy status to penicillin: Secondary | ICD-10-CM

## 2010-09-07 DIAGNOSIS — Z8249 Family history of ischemic heart disease and other diseases of the circulatory system: Secondary | ICD-10-CM

## 2010-09-07 DIAGNOSIS — A598 Trichomoniasis of other sites: Secondary | ICD-10-CM | POA: Diagnosis present

## 2010-09-07 DIAGNOSIS — IMO0002 Reserved for concepts with insufficient information to code with codable children: Secondary | ICD-10-CM

## 2010-09-07 DIAGNOSIS — K219 Gastro-esophageal reflux disease without esophagitis: Secondary | ICD-10-CM | POA: Diagnosis present

## 2010-09-07 DIAGNOSIS — Z992 Dependence on renal dialysis: Secondary | ICD-10-CM

## 2010-09-07 DIAGNOSIS — Z833 Family history of diabetes mellitus: Secondary | ICD-10-CM

## 2010-09-07 DIAGNOSIS — Z888 Allergy status to other drugs, medicaments and biological substances status: Secondary | ICD-10-CM

## 2010-09-07 DIAGNOSIS — E875 Hyperkalemia: Secondary | ICD-10-CM | POA: Diagnosis present

## 2010-09-07 DIAGNOSIS — I120 Hypertensive chronic kidney disease with stage 5 chronic kidney disease or end stage renal disease: Secondary | ICD-10-CM | POA: Diagnosis present

## 2010-09-07 DIAGNOSIS — N3000 Acute cystitis without hematuria: Principal | ICD-10-CM | POA: Diagnosis present

## 2010-09-07 DIAGNOSIS — N186 End stage renal disease: Secondary | ICD-10-CM | POA: Diagnosis present

## 2010-09-07 HISTORY — DX: Essential (primary) hypertension: I10

## 2010-09-07 LAB — URINE MICROSCOPIC-ADD ON

## 2010-09-07 LAB — CBC
HCT: 35.2 % — ABNORMAL LOW (ref 36.0–46.0)
Hemoglobin: 11.4 g/dL — ABNORMAL LOW (ref 12.0–15.0)
MCH: 33.6 pg (ref 26.0–34.0)
MCHC: 32.4 g/dL (ref 30.0–36.0)
RDW: 18.4 % — ABNORMAL HIGH (ref 11.5–15.5)

## 2010-09-07 LAB — URINALYSIS, ROUTINE W REFLEX MICROSCOPIC
Glucose, UA: >1000 mg/dL — AB
Ketones, ur: NEGATIVE mg/dL
Nitrite: NEGATIVE
Protein, ur: >300 mg/dL — AB
Urobilinogen, UA: 0.2 mg/dL (ref 0.0–1.0)

## 2010-09-07 LAB — COMPREHENSIVE METABOLIC PANEL
BUN: 60 mg/dL — ABNORMAL HIGH (ref 6–23)
CO2: 22 mEq/L (ref 19–32)
Calcium: 7.1 mg/dL — ABNORMAL LOW (ref 8.4–10.5)
Creatinine, Ser: 10.33 mg/dL — ABNORMAL HIGH (ref 0.4–1.2)
GFR calc non Af Amer: 4 mL/min — ABNORMAL LOW (ref >60)
Glucose, Bld: 633 mg/dL (ref 70–99)

## 2010-09-07 LAB — GLUCOSE, CAPILLARY: Glucose-Capillary: 246 mg/dL — ABNORMAL HIGH (ref 70–99)

## 2010-09-07 LAB — DIFFERENTIAL
Eosinophils Relative: 1 % (ref 0–5)
Lymphocytes Relative: 18 % (ref 12–46)
Monocytes Absolute: 0.8 10*3/uL (ref 0.1–1.0)
Monocytes Relative: 11 % (ref 3–12)
Neutro Abs: 4.8 10*3/uL (ref 1.7–7.7)

## 2010-09-07 LAB — LIPASE, BLOOD: Lipase: 32 U/L (ref 11–59)

## 2010-09-07 LAB — PREGNANCY, URINE: Preg Test, Ur: NEGATIVE

## 2010-09-07 LAB — AMYLASE: Amylase: 63 U/L (ref 0–105)

## 2010-09-07 LAB — HEMOGLOBIN A1C: Mean Plasma Glucose: 249 mg/dL — ABNORMAL HIGH (ref <117)

## 2010-09-07 LAB — TSH: TSH: 5.045 u[IU]/mL — ABNORMAL HIGH (ref 0.350–4.500)

## 2010-09-08 ENCOUNTER — Inpatient Hospital Stay (HOSPITAL_COMMUNITY): Payer: Medicare Other

## 2010-09-08 DIAGNOSIS — I369 Nonrheumatic tricuspid valve disorder, unspecified: Secondary | ICD-10-CM

## 2010-09-08 LAB — GLUCOSE, CAPILLARY
Glucose-Capillary: 200 mg/dL — ABNORMAL HIGH (ref 70–99)
Glucose-Capillary: 149 mg/dL — ABNORMAL HIGH (ref 70–99)

## 2010-09-08 LAB — CBC
Hemoglobin: 11.1 g/dL — ABNORMAL LOW (ref 12.0–15.0)
MCH: 33.7 pg (ref 26.0–34.0)
RBC: 3.29 MIL/uL — ABNORMAL LOW (ref 3.87–5.11)

## 2010-09-08 LAB — MRSA PCR SCREENING: MRSA by PCR: NEGATIVE

## 2010-09-08 LAB — RENAL FUNCTION PANEL
CO2: 19 mEq/L (ref 19–32)
Chloride: 93 mEq/L — ABNORMAL LOW (ref 96–112)
GFR calc Af Amer: 4 mL/min — ABNORMAL LOW (ref >60)
GFR calc non Af Amer: 3 mL/min — ABNORMAL LOW (ref >60)
Sodium: 129 mEq/L — ABNORMAL LOW (ref 135–145)

## 2010-09-09 ENCOUNTER — Inpatient Hospital Stay (HOSPITAL_COMMUNITY): Payer: Medicare Other

## 2010-09-09 LAB — BASIC METABOLIC PANEL
Chloride: 91 mEq/L — ABNORMAL LOW (ref 96–112)
GFR calc Af Amer: 8 mL/min — ABNORMAL LOW (ref >60)
Potassium: 4.2 mEq/L (ref 3.5–5.1)
Sodium: 135 mEq/L (ref 135–145)

## 2010-09-09 LAB — GLUCOSE, CAPILLARY
Glucose-Capillary: 250 mg/dL — ABNORMAL HIGH (ref 70–99)
Glucose-Capillary: 428 mg/dL — ABNORMAL HIGH (ref 70–99)
Glucose-Capillary: 84 mg/dL (ref 70–99)

## 2010-09-09 LAB — CBC
Platelets: 273 10*3/uL (ref 150–400)
RBC: 3.67 MIL/uL — ABNORMAL LOW (ref 3.87–5.11)
WBC: 7.1 10*3/uL (ref 4.0–10.5)

## 2010-09-10 LAB — RENAL FUNCTION PANEL
Albumin: 3.3 g/dL — ABNORMAL LOW (ref 3.5–5.2)
BUN: 25 mg/dL — ABNORMAL HIGH (ref 6–23)
Chloride: 93 mEq/L — ABNORMAL LOW (ref 96–112)
GFR calc Af Amer: 10 mL/min — ABNORMAL LOW (ref >60)
GFR calc non Af Amer: 9 mL/min — ABNORMAL LOW (ref >60)
Potassium: 4.5 mEq/L (ref 3.5–5.1)
Sodium: 136 mEq/L (ref 135–145)

## 2010-09-10 LAB — GLUCOSE, CAPILLARY
Glucose-Capillary: 282 mg/dL — ABNORMAL HIGH (ref 70–99)
Glucose-Capillary: 230 mg/dL — ABNORMAL HIGH (ref 70–99)
Glucose-Capillary: 169 mg/dL — ABNORMAL HIGH (ref 70–99)
Glucose-Capillary: 360 mg/dL — ABNORMAL HIGH (ref 70–99)

## 2010-09-10 LAB — CBC
MCV: 108.7 fL — ABNORMAL HIGH (ref 78.0–100.0)
Platelets: 256 10*3/uL (ref 150–400)
RBC: 4.01 MIL/uL (ref 3.87–5.11)
WBC: 6.3 10*3/uL (ref 4.0–10.5)

## 2010-09-11 ENCOUNTER — Inpatient Hospital Stay (HOSPITAL_COMMUNITY): Payer: Medicare Other

## 2010-09-11 LAB — CBC
Hemoglobin: 10.4 g/dL — ABNORMAL LOW (ref 12.0–15.0)
MCH: 33.1 pg (ref 26.0–34.0)
MCHC: 30.6 g/dL (ref 30.0–36.0)
MCHC: 32.9 g/dL (ref 30.0–36.0)
MCV: 108 fL — ABNORMAL HIGH (ref 78.0–100.0)
MCV: 97.9 fL (ref 78.0–100.0)
Platelets: 276 10*3/uL (ref 150–400)
RBC: 3.23 MIL/uL — ABNORMAL LOW (ref 3.87–5.11)
RDW: 18.7 % — ABNORMAL HIGH (ref 11.5–15.5)
RDW: 17.2 % — ABNORMAL HIGH (ref 11.5–15.5)

## 2010-09-11 LAB — URINALYSIS, ROUTINE W REFLEX MICROSCOPIC
Bilirubin Urine: NEGATIVE
Nitrite: NEGATIVE
Specific Gravity, Urine: 1.013 (ref 1.005–1.030)
Urobilinogen, UA: 0.2 mg/dL (ref 0.0–1.0)
pH: 7.5 (ref 5.0–8.0)

## 2010-09-11 LAB — DIFFERENTIAL
Basophils Absolute: 0.1 10*3/uL (ref 0.0–0.1)
Basophils Relative: 1 % (ref 0–1)
Eosinophils Absolute: 0.3 10*3/uL (ref 0.0–0.7)
Monocytes Absolute: 0.4 10*3/uL (ref 0.1–1.0)
Monocytes Relative: 5 % (ref 3–12)
Neutrophils Relative %: 76 % (ref 43–77)

## 2010-09-11 LAB — GLUCOSE, CAPILLARY
Glucose-Capillary: 140 mg/dL — ABNORMAL HIGH (ref 70–99)
Glucose-Capillary: 219 mg/dL — ABNORMAL HIGH (ref 70–99)
Glucose-Capillary: 170 mg/dL — ABNORMAL HIGH (ref 70–99)
Glucose-Capillary: 112 mg/dL — ABNORMAL HIGH (ref 70–99)
Glucose-Capillary: 186 mg/dL — ABNORMAL HIGH (ref 70–99)

## 2010-09-11 LAB — BASIC METABOLIC PANEL
CO2: 21 mEq/L (ref 19–32)
Chloride: 97 mEq/L (ref 96–112)
Creatinine, Ser: 16.12 mg/dL — ABNORMAL HIGH (ref 0.4–1.2)
GFR calc Af Amer: 3 mL/min — ABNORMAL LOW (ref >60)
Glucose, Bld: 96 mg/dL (ref 70–99)

## 2010-09-11 LAB — URINE MICROSCOPIC-ADD ON

## 2010-09-12 ENCOUNTER — Inpatient Hospital Stay (HOSPITAL_COMMUNITY): Payer: Medicare Other

## 2010-09-12 LAB — GLUCOSE, CAPILLARY
Glucose-Capillary: 195 mg/dL — ABNORMAL HIGH (ref 70–99)
Glucose-Capillary: 102 mg/dL — ABNORMAL HIGH (ref 70–99)
Glucose-Capillary: 104 mg/dL — ABNORMAL HIGH (ref 70–99)
Glucose-Capillary: 108 mg/dL — ABNORMAL HIGH (ref 70–99)
Glucose-Capillary: 112 mg/dL — ABNORMAL HIGH (ref 70–99)
Glucose-Capillary: 113 mg/dL — ABNORMAL HIGH (ref 70–99)
Glucose-Capillary: 123 mg/dL — ABNORMAL HIGH (ref 70–99)
Glucose-Capillary: 124 mg/dL — ABNORMAL HIGH (ref 70–99)
Glucose-Capillary: 124 mg/dL — ABNORMAL HIGH (ref 70–99)
Glucose-Capillary: 134 mg/dL — ABNORMAL HIGH (ref 70–99)
Glucose-Capillary: 136 mg/dL — ABNORMAL HIGH (ref 70–99)
Glucose-Capillary: 137 mg/dL — ABNORMAL HIGH (ref 70–99)
Glucose-Capillary: 141 mg/dL — ABNORMAL HIGH (ref 70–99)
Glucose-Capillary: 142 mg/dL — ABNORMAL HIGH (ref 70–99)
Glucose-Capillary: 148 mg/dL — ABNORMAL HIGH (ref 70–99)
Glucose-Capillary: 158 mg/dL — ABNORMAL HIGH (ref 70–99)
Glucose-Capillary: 180 mg/dL — ABNORMAL HIGH (ref 70–99)
Glucose-Capillary: 224 mg/dL — ABNORMAL HIGH (ref 70–99)
Glucose-Capillary: 67 mg/dL — ABNORMAL LOW (ref 70–99)
Glucose-Capillary: 84 mg/dL (ref 70–99)
Glucose-Capillary: 88 mg/dL (ref 70–99)
Glucose-Capillary: 98 mg/dL (ref 70–99)

## 2010-09-12 LAB — CBC
HCT: 35.1 % — ABNORMAL LOW (ref 36.0–46.0)
HCT: 36.5 % (ref 36.0–46.0)
Hemoglobin: 11.4 g/dL — ABNORMAL LOW (ref 12.0–15.0)
Hemoglobin: 11.9 g/dL — ABNORMAL LOW (ref 12.0–15.0)
Hemoglobin: 12.4 g/dL (ref 12.0–15.0)
Hemoglobin: 7.9 g/dL — CL (ref 12.0–15.0)
MCHC: 30.7 g/dL (ref 30.0–36.0)
MCHC: 33.4 g/dL (ref 30.0–36.0)
MCHC: 33.7 g/dL (ref 30.0–36.0)
MCHC: 33.8 g/dL (ref 30.0–36.0)
MCHC: 33.9 g/dL (ref 30.0–36.0)
MCHC: 34 g/dL (ref 30.0–36.0)
MCHC: 34 g/dL (ref 30.0–36.0)
MCV: 94.1 fL (ref 78.0–100.0)
MCV: 94.7 fL (ref 78.0–100.0)
MCV: 94.8 fL (ref 78.0–100.0)
MCV: 97.4 fL (ref 78.0–100.0)
MCV: 97.5 fL (ref 78.0–100.0)
Platelets: 262 10*3/uL (ref 150–400)
Platelets: 208 10*3/uL (ref 150–400)
Platelets: 210 10*3/uL (ref 150–400)
Platelets: 250 10*3/uL (ref 150–400)
RBC: 2.43 MIL/uL — ABNORMAL LOW (ref 3.87–5.11)
RBC: 3.55 MIL/uL — ABNORMAL LOW (ref 3.87–5.11)
RBC: 3.92 MIL/uL (ref 3.87–5.11)
RDW: 17.7 % — ABNORMAL HIGH (ref 11.5–15.5)
RDW: 17.9 % — ABNORMAL HIGH (ref 11.5–15.5)
RDW: 18.1 % — ABNORMAL HIGH (ref 11.5–15.5)
RDW: 18.3 % — ABNORMAL HIGH (ref 11.5–15.5)
RDW: 18.4 % — ABNORMAL HIGH (ref 11.5–15.5)
RDW: 18.4 % — ABNORMAL HIGH (ref 11.5–15.5)
RDW: 18.6 % — ABNORMAL HIGH (ref 11.5–15.5)
WBC: 13 10*3/uL — ABNORMAL HIGH (ref 4.0–10.5)
WBC: 9.4 10*3/uL (ref 4.0–10.5)

## 2010-09-12 LAB — POCT I-STAT 3, ART BLOOD GAS (G3+)
Acid-Base Excess: 2 mmol/L (ref 0.0–2.0)
Acid-Base Excess: 3 mmol/L — ABNORMAL HIGH (ref 0.0–2.0)
Acid-Base Excess: 4 mmol/L — ABNORMAL HIGH (ref 0.0–2.0)
Acid-Base Excess: 9 mmol/L — ABNORMAL HIGH (ref 0.0–2.0)
Bicarbonate: 22.9 mEq/L (ref 20.0–24.0)
Bicarbonate: 23.7 mEq/L (ref 20.0–24.0)
Bicarbonate: 27.5 mEq/L — ABNORMAL HIGH (ref 20.0–24.0)
Bicarbonate: 28.6 mEq/L — ABNORMAL HIGH (ref 20.0–24.0)
Bicarbonate: 30.8 mEq/L — ABNORMAL HIGH (ref 20.0–24.0)
O2 Saturation: 94 %
O2 Saturation: 99 %
O2 Saturation: 99 %
Patient temperature: 101.2
Patient temperature: 98.2
Patient temperature: 98.4
Patient temperature: 98.5
TCO2: 24 mmol/L (ref 0–100)
TCO2: 25 mmol/L (ref 0–100)
TCO2: 28 mmol/L (ref 0–100)
TCO2: 28 mmol/L (ref 0–100)
TCO2: 29 mmol/L (ref 0–100)
TCO2: 32 mmol/L (ref 0–100)
pCO2 arterial: 29.3 mmHg — ABNORMAL LOW (ref 35.0–45.0)
pCO2 arterial: 30.2 mmHg — ABNORMAL LOW (ref 35.0–45.0)
pCO2 arterial: 32 mmHg — ABNORMAL LOW (ref 35.0–45.0)
pCO2 arterial: 35.6 mmHg (ref 35.0–45.0)
pCO2 arterial: 35.8 mmHg (ref 35.0–45.0)
pH, Arterial: 7.445 — ABNORMAL HIGH (ref 7.350–7.400)
pH, Arterial: 7.461 — ABNORMAL HIGH (ref 7.350–7.400)
pH, Arterial: 7.462 — ABNORMAL HIGH (ref 7.350–7.400)
pH, Arterial: 7.482 — ABNORMAL HIGH (ref 7.350–7.400)
pH, Arterial: 7.511 — ABNORMAL HIGH (ref 7.350–7.400)
pH, Arterial: 7.581 — ABNORMAL HIGH (ref 7.350–7.400)
pH, Arterial: 7.583 — ABNORMAL HIGH (ref 7.350–7.400)
pO2, Arterial: 123 mmHg — ABNORMAL HIGH (ref 80.0–100.0)
pO2, Arterial: 311 mmHg — ABNORMAL HIGH (ref 80.0–100.0)
pO2, Arterial: 390 mmHg — ABNORMAL HIGH (ref 80.0–100.0)
pO2, Arterial: 95 mmHg (ref 80.0–100.0)

## 2010-09-12 LAB — MAGNESIUM
Magnesium: 2.2 mg/dL (ref 1.5–2.5)
Magnesium: 2.6 mg/dL — ABNORMAL HIGH (ref 1.5–2.5)
Magnesium: 2.8 mg/dL — ABNORMAL HIGH (ref 1.5–2.5)

## 2010-09-12 LAB — CK: Total CK: 1727 U/L — ABNORMAL HIGH (ref 7–177)

## 2010-09-12 LAB — LACTATE DEHYDROGENASE, ISOENZYMES
LDH 1: 16 % (ref 14–27)
LDH 4: 10 % (ref 8–15)
LDH 5: 30 % — ABNORMAL HIGH (ref 6–23)

## 2010-09-12 LAB — CROSSMATCH

## 2010-09-12 LAB — BASIC METABOLIC PANEL
BUN: 38 mg/dL — ABNORMAL HIGH (ref 6–23)
CO2: 25 mEq/L (ref 19–32)
Calcium: 9.4 mg/dL (ref 8.4–10.5)
Glucose, Bld: 146 mg/dL — ABNORMAL HIGH (ref 70–99)
Sodium: 132 mEq/L — ABNORMAL LOW (ref 135–145)

## 2010-09-12 LAB — POCT I-STAT, CHEM 8
BUN: 102 mg/dL — ABNORMAL HIGH (ref 6–23)
Calcium, Ion: 0.76 mmol/L — ABNORMAL LOW (ref 1.12–1.32)
TCO2: 25 mmol/L (ref 0–100)

## 2010-09-12 LAB — COMPREHENSIVE METABOLIC PANEL
AST: 74 U/L — ABNORMAL HIGH (ref 0–37)
Albumin: 2.8 g/dL — ABNORMAL LOW (ref 3.5–5.2)
Alkaline Phosphatase: 69 U/L (ref 39–117)
Chloride: 95 mEq/L — ABNORMAL LOW (ref 96–112)
GFR calc Af Amer: 5 mL/min — ABNORMAL LOW (ref >60)
Potassium: 4.1 mEq/L (ref 3.5–5.1)
Sodium: 137 mEq/L (ref 135–145)
Total Bilirubin: 1.1 mg/dL (ref 0.3–1.2)
Total Protein: 6.7 g/dL (ref 6.0–8.3)

## 2010-09-12 LAB — DIFFERENTIAL
Basophils Absolute: 0 10*3/uL (ref 0.0–0.1)
Basophils Absolute: 0 10*3/uL (ref 0.0–0.1)
Basophils Relative: 0 % (ref 0–1)
Eosinophils Absolute: 0 10*3/uL (ref 0.0–0.7)
Eosinophils Relative: 0 % (ref 0–5)
Lymphocytes Relative: 7 % — ABNORMAL LOW (ref 12–46)
Neutro Abs: 12 10*3/uL — ABNORMAL HIGH (ref 1.7–7.7)
Neutro Abs: 14.2 10*3/uL — ABNORMAL HIGH (ref 1.7–7.7)
Neutrophils Relative %: 92 % — ABNORMAL HIGH (ref 43–77)
Neutrophils Relative %: 93 % — ABNORMAL HIGH (ref 43–77)

## 2010-09-12 LAB — POCT I-STAT 4, (NA,K, GLUC, HGB,HCT)
HCT: 37 % (ref 36.0–46.0)
Hemoglobin: 12.6 g/dL (ref 12.0–15.0)
Potassium: 5 mEq/L (ref 3.5–5.1)
Sodium: 132 mEq/L — ABNORMAL LOW (ref 135–145)

## 2010-09-12 LAB — CULTURE, BLOOD (ROUTINE X 2)

## 2010-09-12 LAB — RENAL FUNCTION PANEL
Albumin: 2.6 g/dL — ABNORMAL LOW (ref 3.5–5.2)
Albumin: 3.5 g/dL (ref 3.5–5.2)
BUN: 31 mg/dL — ABNORMAL HIGH (ref 6–23)
BUN: 59 mg/dL — ABNORMAL HIGH (ref 6–23)
BUN: 73 mg/dL — ABNORMAL HIGH (ref 6–23)
BUN: 78 mg/dL — ABNORMAL HIGH (ref 6–23)
CO2: 23 mEq/L (ref 19–32)
CO2: 23 mEq/L (ref 19–32)
CO2: 24 mEq/L (ref 19–32)
CO2: 25 mEq/L (ref 19–32)
Calcium: 8.4 mg/dL (ref 8.4–10.5)
Calcium: 8.5 mg/dL (ref 8.4–10.5)
Calcium: 8.9 mg/dL (ref 8.4–10.5)
Calcium: 9.1 mg/dL (ref 8.4–10.5)
Chloride: 87 mEq/L — ABNORMAL LOW (ref 96–112)
Chloride: 95 mEq/L — ABNORMAL LOW (ref 96–112)
Creatinine, Ser: 10.35 mg/dL — ABNORMAL HIGH (ref 0.4–1.2)
Creatinine, Ser: 6.82 mg/dL — ABNORMAL HIGH (ref 0.4–1.2)
Creatinine, Ser: 7.27 mg/dL — ABNORMAL HIGH (ref 0.4–1.2)
Creatinine, Ser: 7.6 mg/dL — ABNORMAL HIGH (ref 0.4–1.2)
Creatinine, Ser: 9.49 mg/dL — ABNORMAL HIGH (ref 0.4–1.2)
GFR calc Af Amer: 5 mL/min — ABNORMAL LOW (ref >60)
GFR calc Af Amer: 7 mL/min — ABNORMAL LOW (ref >60)
GFR calc non Af Amer: 6 mL/min — ABNORMAL LOW (ref >60)
Glucose, Bld: 125 mg/dL — ABNORMAL HIGH (ref 70–99)
Glucose, Bld: 135 mg/dL — ABNORMAL HIGH (ref 70–99)
Phosphorus: 4.6 mg/dL (ref 2.3–4.6)
Phosphorus: 6.7 mg/dL — ABNORMAL HIGH (ref 2.3–4.6)
Sodium: 136 mEq/L (ref 135–145)

## 2010-09-12 LAB — BLOOD GAS, ARTERIAL
Bicarbonate: 26.7 mEq/L — ABNORMAL HIGH (ref 20.0–24.0)
PEEP: 0.5 cmH2O
Patient temperature: 98.4
pH, Arterial: 7.461 — ABNORMAL HIGH (ref 7.350–7.400)
pO2, Arterial: 115 mmHg — ABNORMAL HIGH (ref 80.0–100.0)

## 2010-09-12 LAB — CULTURE, BAL-QUANTITATIVE W GRAM STAIN

## 2010-09-12 LAB — LIPASE, BLOOD
Lipase: 18 U/L (ref 11–59)
Lipase: 20 U/L (ref 11–59)

## 2010-09-12 LAB — PHOSPHORUS: Phosphorus: 4.6 mg/dL (ref 2.3–4.6)

## 2010-09-12 LAB — GLUCOSE 6 PHOSPHATE DEHYDROGENASE: G-6-PD, Quant: 14 U/g{Hb} (ref 7–20)

## 2010-09-12 LAB — AMYLASE: Amylase: 270 U/L — ABNORMAL HIGH (ref 27–131)

## 2010-09-12 LAB — SEDIMENTATION RATE: Sed Rate: 116 mm/hr — ABNORMAL HIGH (ref 0–22)

## 2010-09-13 ENCOUNTER — Inpatient Hospital Stay (HOSPITAL_COMMUNITY): Payer: Medicare Other

## 2010-09-13 LAB — CBC
HCT: 38.5 % (ref 36.0–46.0)
MCV: 106.1 fL — ABNORMAL HIGH (ref 78.0–100.0)
Platelets: 261 10*3/uL (ref 150–400)
RBC: 3.63 MIL/uL — ABNORMAL LOW (ref 3.87–5.11)
WBC: 7.4 10*3/uL (ref 4.0–10.5)

## 2010-09-13 LAB — GLUCOSE, CAPILLARY: Glucose-Capillary: 103 mg/dL — ABNORMAL HIGH (ref 70–99)

## 2010-09-14 ENCOUNTER — Inpatient Hospital Stay (HOSPITAL_COMMUNITY): Payer: Medicare Other

## 2010-09-14 ENCOUNTER — Encounter (HOSPITAL_COMMUNITY): Payer: Self-pay | Admitting: Radiology

## 2010-09-14 LAB — CBC
HCT: 35.4 % — ABNORMAL LOW (ref 36.0–46.0)
Hemoglobin: 11.1 g/dL — ABNORMAL LOW (ref 12.0–15.0)
MCV: 104.7 fL — ABNORMAL HIGH (ref 78.0–100.0)
RDW: 16.9 % — ABNORMAL HIGH (ref 11.5–15.5)
WBC: 7.1 10*3/uL (ref 4.0–10.5)

## 2010-09-14 LAB — RENAL FUNCTION PANEL
BUN: 74 mg/dL — ABNORMAL HIGH (ref 6–23)
CO2: 20 mEq/L (ref 19–32)
Chloride: 100 mEq/L (ref 96–112)
Glucose, Bld: 139 mg/dL — ABNORMAL HIGH (ref 70–99)
Phosphorus: 6.6 mg/dL — ABNORMAL HIGH (ref 2.3–4.6)
Potassium: 5 mEq/L (ref 3.5–5.1)

## 2010-09-14 LAB — CULTURE, BLOOD (ROUTINE X 2): Culture: NO GROWTH

## 2010-09-14 LAB — GLUCOSE, CAPILLARY: Glucose-Capillary: 180 mg/dL — ABNORMAL HIGH (ref 70–99)

## 2010-09-14 MED ORDER — IOHEXOL 300 MG/ML  SOLN
50.0000 mL | Freq: Once | INTRAMUSCULAR | Status: AC | PRN
  Administered 2010-09-14: 15 mL via INTRAVENOUS

## 2010-09-14 MED ORDER — IOHEXOL 300 MG/ML  SOLN
100.0000 mL | Freq: Once | INTRAMUSCULAR | Status: AC | PRN
  Administered 2010-09-14: 100 mL via INTRAVENOUS

## 2010-09-15 LAB — CULTURE, BLOOD (ROUTINE X 2)

## 2010-09-16 ENCOUNTER — Inpatient Hospital Stay (HOSPITAL_COMMUNITY): Payer: Medicare Other

## 2010-09-16 LAB — GLUCOSE, CAPILLARY

## 2010-09-16 LAB — CBC
HCT: 33.2 % — ABNORMAL LOW (ref 36.0–46.0)
Hemoglobin: 10.2 g/dL — ABNORMAL LOW (ref 12.0–15.0)
MCV: 107.1 fL — ABNORMAL HIGH (ref 78.0–100.0)
RBC: 3.1 MIL/uL — ABNORMAL LOW (ref 3.87–5.11)
WBC: 7 10*3/uL (ref 4.0–10.5)

## 2010-09-16 LAB — RENAL FUNCTION PANEL
BUN: 60 mg/dL — ABNORMAL HIGH (ref 6–23)
Chloride: 102 mEq/L (ref 96–112)
Glucose, Bld: 190 mg/dL — ABNORMAL HIGH (ref 70–99)
Potassium: 6.2 mEq/L — ABNORMAL HIGH (ref 3.5–5.1)

## 2010-09-17 ENCOUNTER — Inpatient Hospital Stay (HOSPITAL_COMMUNITY): Payer: Medicare Other

## 2010-09-17 LAB — BASIC METABOLIC PANEL
Calcium: 7.8 mg/dL — ABNORMAL LOW (ref 8.4–10.5)
GFR calc Af Amer: 11 mL/min — ABNORMAL LOW (ref >60)
GFR calc non Af Amer: 9 mL/min — ABNORMAL LOW (ref >60)
Glucose, Bld: 414 mg/dL — ABNORMAL HIGH (ref 70–99)
Potassium: 4.5 mEq/L (ref 3.5–5.1)
Sodium: 134 mEq/L — ABNORMAL LOW (ref 135–145)

## 2010-09-17 LAB — RENAL FUNCTION PANEL
Albumin: 3.1 g/dL — ABNORMAL LOW (ref 3.5–5.2)
BUN: 37 mg/dL — ABNORMAL HIGH (ref 6–23)
Chloride: 96 mEq/L (ref 96–112)
Creatinine, Ser: 6.32 mg/dL — ABNORMAL HIGH (ref 0.4–1.2)
Glucose, Bld: 477 mg/dL — ABNORMAL HIGH (ref 70–99)

## 2010-09-17 LAB — CBC
HCT: 37.4 % (ref 36.0–46.0)
MCH: 32.7 pg (ref 26.0–34.0)
MCV: 107.2 fL — ABNORMAL HIGH (ref 78.0–100.0)
Platelets: 241 10*3/uL (ref 150–400)
RBC: 3.49 MIL/uL — ABNORMAL LOW (ref 3.87–5.11)
RDW: 17 % — ABNORMAL HIGH (ref 11.5–15.5)

## 2010-09-17 LAB — POTASSIUM: Potassium: 5.5 mEq/L — ABNORMAL HIGH (ref 3.5–5.1)

## 2010-09-17 LAB — POCT I-STAT 4, (NA,K, GLUC, HGB,HCT)
Glucose, Bld: 65 mg/dL — ABNORMAL LOW (ref 70–99)
HCT: 38 % (ref 36.0–46.0)
Potassium: 2.8 mEq/L — ABNORMAL LOW (ref 3.5–5.1)

## 2010-09-17 LAB — GLUCOSE, CAPILLARY
Glucose-Capillary: 31 mg/dL — CL (ref 70–99)
Glucose-Capillary: 443 mg/dL — ABNORMAL HIGH (ref 70–99)

## 2010-09-18 LAB — GLUCOSE, CAPILLARY
Glucose-Capillary: 91 mg/dL (ref 70–99)
Glucose-Capillary: 117 mg/dL — ABNORMAL HIGH (ref 70–99)
Glucose-Capillary: 143 mg/dL — ABNORMAL HIGH (ref 70–99)
Glucose-Capillary: 163 mg/dL — ABNORMAL HIGH (ref 70–99)
Glucose-Capillary: 192 mg/dL — ABNORMAL HIGH (ref 70–99)

## 2010-09-18 LAB — CBC
MCHC: 30.7 g/dL (ref 30.0–36.0)
Platelets: 248 10*3/uL (ref 150–400)
RDW: 16.4 % — ABNORMAL HIGH (ref 11.5–15.5)
WBC: 5.5 10*3/uL (ref 4.0–10.5)

## 2010-09-18 LAB — RENAL FUNCTION PANEL
Albumin: 3.3 g/dL — ABNORMAL LOW (ref 3.5–5.2)
Calcium: 8 mg/dL — ABNORMAL LOW (ref 8.4–10.5)
GFR calc Af Amer: 10 mL/min — ABNORMAL LOW (ref >60)
Glucose, Bld: 128 mg/dL — ABNORMAL HIGH (ref 70–99)
Phosphorus: 4.6 mg/dL (ref 2.3–4.6)
Potassium: 3.9 mEq/L (ref 3.5–5.1)
Sodium: 136 mEq/L (ref 135–145)

## 2010-09-18 NOTE — H&P (Signed)
[name], [name] NO.:  [id number]  MEDICAL RECORD NO.:  [id number]           PATIENT TYPE:  E  LOCATION:  [facility]                         FACILITY:  [facility]  PHYSICIAN:  Mariea Stable, MD   DATE OF BIRTH:  1970-11-14  DATE OF ADMISSION:  09/07/2010 DATE OF DISCHARGE:                             HISTORY & PHYSICAL   PRIMARY CARE PHYSICIAN:  Robyn N. Allyne Gee, MD.  PRIMARY NEPHROLOGIST:  Duke Salvia. Eliott Nine, MD  CHIEF COMPLAINT:  Nausea and vomiting.  HISTORY OF PRESENT ILLNESS:  [name] is a 40 year old woman with past medical history significant for prior transplanted pancreas and kidney with failed allograft, now on hemodialysis as well who presents with chief complaint of nausea and vomiting.  The patient had a recent hospitalization from July 30, 2010 through August 07, 2010, for what was thought to be a flu-like syndrome with vague complaints including cough, some shortness of breath, and body aches.  She was actually found to have Pantoea agglomerans bacteremia with 1/1 blood cultures on July 31, 2010.  She was treated with 2-week course of vancomycin and ceftazidime with her dialysis again for a total of 2 weeks.  The patient reports that her symptoms improves during this time, but they were stopped 9 days ago after she completed her 2 weeks worth of antibiotics.  Since then she has had nausea and vomiting that has been progressive.  She has also had some mild headaches along with coughing and shortness of breath, which is similar to before.  She denies any sputum production or chest pain.  She apparently has been to the [facility] twice because of these headaches wax and wane.  She thought that they were related to her allergies, but has had no significant improvement with the allergy medicines.  She also reports some subjective fevers described as chills and sweats.  REVIEW OF SYSTEMS:  The patient was seen at the hemodialysis  center today, but because of her symptoms was sent to the emergency department for further evaluation.  Significant findings reveal a glucose of 633 with pseudohyponatremia, bicarb of 22, and a gap of 17 along with large leukocyte esterase and too numerous to count white blood cells on urinalysis.  Furthermore, the patient was noted to have left-sided CVA tenderness and medicine was called to admit for possible pyelo.  PAST MEDICAL HISTORY: 1. As mentioned above Pantoea agglomerans group G bacteremia. 2. Brittle type 1 diabetes. 3. End-stage renal disease, on hemodialysis Tuesday, Thursday, and     Saturday. 4. Anemia of chronic disease. 5. Hypertension. 6. History of pancreatic transplant on immunosuppressive therapy as     well as renal transplant that is now failed. 7. Secondary hyperparathyroidism.  MEDICATIONS: 1. Klonopin 1 mg two tablets p.o. at bedtime. 2. Prednisone 5 mg p.o. daily. 3. NovoLog 3-10 units subcutaneous 1 to 3 times daily. 4. Actos 45 mg p.o. daily. 5. CellCept 250 mg 4 tablets p.o. b.i.d. 6. Aspirin 81 mg p.o. daily. 7. Calcium acetate 667 mg 2-3 capsules three times a day with meals. 8. Ambien 10 mg p.o. at bedtime. 9. Phenergan 25  mg 1 tablet p.o. b.i.d. p.r.n. nausea. 10.Protonix 40 mg 1 tablet p.o. daily. 11.Prograf 1 mg 3 capsules b.i.d. 12.Norvasc 10 mg p.o. every other day. 13.Toprol-XL 100 mg p.o. every other day. 14.Renal vitamin 1 tablet p.o. daily.  ALLERGIES: 1. MORPHINE SULFATE, WHICH CAUSES DELIRIUM. 2. TRAMADOL, WHICH CAUSES NAUSEA AND VOMITING. 3. CHLORHEXIDINE, WHICH CAUSES RASH OR HIVES. 4. METRONIDAZOLE, WHICH CAUSES HIVES. 5. VICODIN, WHICH CAUSES NAUSEA AND VOMITING. 6. PENICILLIN, WHICH CAUSES RASH.  SOCIAL HISTORY:  The patient is married.  States her husband is the one to be called in case of need for medical decision making, his name is Bryon Lions and phone number is [phone].  She denies any tobacco, alcohol, or  illicit drug use.  FAMILY HISTORY:  Significant for hypertension, diabetes, and coronary artery disease.  REVIEW OF SYSTEMS:  As per HPI.  All others reviewed and negative, specifically no urinary complaints.  PHYSICAL EXAM:  VITAL SIGNS:  Temperature 98.1, blood pressure 180/86, heart rate of 86, respirations 18, oxygen saturation 99% on room air. GENERAL:  This is a young woman sitting in bed, in no acute distress. HEENT:  Head is normocephalic, atraumatic.  Pupils are equally round and reactive to light.  Extraocular movements are intact, though there is some strabismus present.  Sclerae anicteric. NECK:  Supple, there is some fullness appreciated in bilateral neck that is symmetric, but not consistent with thyromegaly.  There is no carotid bruits appreciated. LUNGS:  There is good air movement bilaterally that is clear to auscultation. HEART:  Normal S1 and S2 with a grade 2/6 systolic murmur, best heard at the left sternal border fourth intercostal space. ABDOMEN:  Positive bowel sounds, soft, and nontender.  There is no guarding. GU:  There is marked left-sided CVA tenderness. NEUROLOGIC:  Patient is awake, alert, and oriented x3.  Grossly nonfocal, there is no meningeal signs. EXTREMITIES:  There is no edema with decreased skin turgor.  LABORATORY DATA:  WBC 6.9, hemoglobin 11.4, platelets 247.  Amylase 63, lipase 32.  Sodium 128, potassium 5.3, chloride 89, bicarb 22, glucose 633, BUN 60, creatinine 10.33.  LFTs are within normal limits.  Urine pregnancy test is negative.  Urinalysis shows greater than 1000 glucose, large ketones, greater than 300 protein, large blood with too numerous to count wbc's on microscopy and 21-50 rbc's.  Note, there is Trichomonas present.  ASSESSMENT/PLAN: 1. Nausea and vomiting.  This is overall nonspecific.  Patient has     multiple other complaints that are similar to the prior admission     that was described as a flu-like syndrome with  the Pantoea     agglomerans bacteremia noted on 1/2 blood cultures.  There is no     clear sign of infection currently as the patient is afebrile and     does not have a leukocytosis.  Concerning is the prior bacteremia     noted, but she completed 2 weeks of antibiotics for this with her     hemodialysis.  Given her vague symptoms, we will go ahead and     obtain blood cultures again.  Furthermore it is possible that this     can be a urinary tract infection/pyelo with 30 urine and left-sided     cerebrovascular accident tenderness, although this can be pyuria     without a true urinary tract infection.  Given her immunosuppressed     status, we will go ahead and culture her urine and CT of abdomen  and pelvis to make sure that there is no intraabdominal process     such as perinephric abscess.  Moreover, the patient is noted to     have a grade II to III systolic murmur that I do not see documented     previously, in the setting of this prior bacteremia, we will go     ahead and obtain a 2-D echocardiogram along with blood cultures to     assess for possible vegetations.  Again, the patient as noted above     in her labs has a glucose of 600, although bicarb is 22, she does     have a gap of 17.  We will go ahead and treat this with insulin,    although again I do not think this is diabetic ketoacidosis and/or     the cause of her symptoms.  We will watch closely overnight and     treat with Zofran and Phenergan for symptom management.  Can     consider getting a gastric emptying study as the patient is     diabetic and there may be some component of gastroparesis for her     nausea and vomiting.  Furthermore, the patient is on 5 mg     prednisone p.o. daily, which raises the potential for adrenal     insufficiency causing these vague symptoms; however, she is     currently markedly hypertensive and do not feel that this is very     likely at this point.  Can consider an empiric trial  of increasing     steroids to see if this improved symptoms with subsequent taper     back to baseline. 2. End-stage renal disease, on hemodialysis.  The patient currently     does not have any need for acute dialysis.  Her potassium is noted     to be minimally elevated at 5.3, though there is hemolysis noted.     Furthermore, she is not uremic and all her other acid-     base/electrolytes are not critical at this point.  We will discuss     with renal for hemodialysis tomorrow. 3. Pseudohyponatremia.  This is secondary to the elevated glucose.  We     will go ahead and use sliding scale insulin as the patient has a     very brittle diabetic. 4. History of renal/pancreatic transplant.  The patient is on     immunosuppressors with Prograf and CellCept.  We will go ahead and     continue these at the home dose.  Can consider checking a Prograf     level (FK506), but this is sent on lab and will take, I would     assume 3-5 days to get the results. 5. Hyperglycemia.  As mentioned above, we will go ahead and treat with     insulin as the patient is a brittle diabetic. 6. Hypertension.  We will go ahead and continue the patient's home     medications.  Assume that this will improve with dialysis tomorrow. 7. Secondary hyperparathyroidism.  We will continue with renal diet     along with her calcium acetate t.i.d. with meals.     Mariea Stable, MD     MA/MEDQ  D:  09/07/2010  T:  09/07/2010  Job:  [id number]  cc:   Candyce Churn. Allyne Gee, M.D. Duke Salvia Eliott Nine, M.D.  Electronically Signed by Mariea Stable MD on 09/18/2010 10:40:45 AM

## 2010-09-18 NOTE — Group Therapy Note (Signed)
NAMEKAYANI, RAPAPORT NO.:  [id number]  MEDICAL RECORD NO.:  [id number]           PATIENT TYPE:  I  LOCATION:  6738                         FACILITY:  [facility]  PHYSICIAN:  Zannie Cove, MD     DATE OF BIRTH:  [birth year]                                PROGRESS NOTE   PRIMARY CARE PHYSICIAN: Robyn N. Allyne Gee, MD  NEPHROLOGIST: Duke Salvia. Eliott Nine, MD  DISCHARGE DIAGNOSES: 1. Presumed pyelonephritis of the transplant kidney. 2. Type 2 diabetes. 3. End-stage renal disease on hemodialysis status post failed     allograft transplant. 4. Status post pancreas transplant on immunosuppressant. 5. Anemia of chronic disease. 6. Hypertension. 7. Brittle type 1 diabetes. 8. History of recent Pantoea agglomerans group B bacteremia. 9. Secondary hyperparathyroidism.  CONSULTANT: Dr. Caryn Section and associated with [facility].  DIAGNOSTICS: CT abdomen and pelvis on September 08, 2010, shows 4.3 cm bilateral renal atrophy, numerous small cysts.  No dominant mass seen, minimal small stones noted in the left renal calices and diffuse calcification along the abdominal aorta and its branches advanced for age and pancreatic and renal transplant identified on CT.  The renal transplant has undergone interval atrophy and no gross hydronephrosis and no evidence of adnexal mass.  Renal ultrasound on September 11, 2010, bilateral native renal atrophy, cortical thinning, and increased echogenicity compatible with medical renal disease.  No flow evident in the left lower quadrant transplant kidney.  HOSPITAL COURSE: [name] is a 40 year old African American female with history of prior kidney and pancreas transplant and failed allograft, now back on hemodialysis on Tuesday, Thursday, and Saturday presented to the hospital with nausea, vomiting, and left-sided CVA tenderness. 1. Nausea, vomiting, and left flank pain, which is felt to be     secondary to pyelonephritis.  She had little bit  of leukocytosis     after admission started on IV levofloxacin.  Unfortunately, we have     not been able to get urine cultures on the patient since she only     urinates very infrequently.  However, her noncontrast CT and     ultrasound have not shown any acute process involving the native or     transplanted kidneys, she did have initial clinical response to the     IV antibiotics and was doing well with almost complete resolution     of her nausea and vomiting, but today again started having     increasing pain in her left flank and costovertebral angle as well     as recurrence of nausea and vomiting and hence upon discussion with Dr. Caryn Section     decided to go ahead and get a CT with IV contrast to     further evaluate the costovertebral angle and her transplanted     kidney. 2. Status post pancreatic transplant with uncontrolled hypoglycemia.     On admission, her CBGs were in the 600s, which have since improved     with resuming her Lantus and sliding scale.  She is followed by Dr.     Ruel Favors at [facility] for her transplant care and we will  need to follow up there to determine if she needs to continue on     her immunosuppressants. 3. Anemia of chronic disease, stable.  Discharge medications and follow-up to be dictated at the time of actual discharge.     Zannie Cove, MD     PJ/MEDQ  D:  09/12/2010  T:  09/12/2010  Job:  [id number]  Electronically Signed by Zannie Cove  on 09/18/2010 08:40:13 PM

## 2010-09-19 ENCOUNTER — Inpatient Hospital Stay (HOSPITAL_COMMUNITY): Payer: Medicare Other

## 2010-09-19 LAB — RENAL FUNCTION PANEL
Albumin: 3.2 g/dL — ABNORMAL LOW (ref 3.5–5.2)
Calcium: 8.1 mg/dL — ABNORMAL LOW (ref 8.4–10.5)
Chloride: 102 mEq/L (ref 96–112)
Creatinine, Ser: 8.76 mg/dL — ABNORMAL HIGH (ref 0.4–1.2)
GFR calc Af Amer: 6 mL/min — ABNORMAL LOW (ref >60)
GFR calc non Af Amer: 5 mL/min — ABNORMAL LOW (ref >60)
Sodium: 136 mEq/L (ref 135–145)

## 2010-09-19 LAB — CBC
MCH: 32.7 pg (ref 26.0–34.0)
MCHC: 31.3 g/dL (ref 30.0–36.0)
Platelets: 221 10*3/uL (ref 150–400)
RBC: 3.18 MIL/uL — ABNORMAL LOW (ref 3.87–5.11)

## 2010-09-19 LAB — GLUCOSE, CAPILLARY: Glucose-Capillary: 145 mg/dL — ABNORMAL HIGH (ref 70–99)

## 2010-09-20 LAB — GLUCOSE, CAPILLARY
Glucose-Capillary: 91 mg/dL (ref 70–99)
Glucose-Capillary: 227 mg/dL — ABNORMAL HIGH (ref 70–99)

## 2010-10-02 ENCOUNTER — Inpatient Hospital Stay (HOSPITAL_COMMUNITY)
Admission: RE | Admit: 2010-10-02 | Discharge: 2010-10-02 | Disposition: A | Discharge status: Home / Self Care | Payer: Medicare Other | Source: Ambulatory Visit | Attending: Family Medicine | Admitting: Family Medicine

## 2010-10-08 NOTE — Discharge Summary (Signed)
[name], [name]               ACCOUNT NO.:  [id number]  MEDICAL RECORD NO.:  [id number]           PATIENT TYPE:  I  LOCATION:  6738                         FACILITY:  [facility]  PHYSICIAN:  Marcellus Scott, MD     DATE OF BIRTH:  1970-10-05  DATE OF ADMISSION:  09/07/2010 DATE OF DISCHARGE:  09/20/2010                              DISCHARGE SUMMARY   PRIMARY CARE PHYSICIAN:  Robyn N. Allyne Gee, MD  NEPHROLOGIST:  Duke Salvia. Eliott Nine, MD  TRANSPLANT MD:  Dr. Ruel Favors at [facility].  DISCHARGE DIAGNOSES: 1. Acute cystitis, completed treatment. 2. Left flank pain, possibly musculoskeletal. 3. Brittle type 1 diabetes mellitus. 4. End-stage renal disease on hemodialysis. 5. 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     Still on immunosuppressants. 6. Anemia of chronic disease. 7. Hypertension. 8. Secondary hyperparathyroidism. 9. Diarrhea, workup negative, resolved. 10.Trichomoniasis, treated. 11.Hyperkalemia, resolved. 12.A 4.3 cm cystic structure adjacent to the left ovary.  Outpatient     evaluation as deemed necessary.  DISCHARGE MEDICATIONS: 1. Tylenol 650 mg p.o. q. 4 hourly p.r.n. pain. 2. Amlodipine 5 mg p.o. at bedtime. 3. Flexeril 5 mg p.o. three times a day. 4. Dilaudid 2 mg p.o. q. 6 hourly p.r.n. pain. 5. Insulin NovoLog 2 units subcutaneously t.i.d. with meals. 6. Sliding scale insulin subcutaneously t.i.d. with meals.     Specialized scale. 7. Lantus 5 units subcutaneously at bedtime. 8. Metoprolol succinate 50 mg p.o. at bedtime. 9. Ambien 10 mg p.o. at bedtime. 10.Enteric-coated aspirin 81 mg p.o. daily. 11.Calcium acetate 667 mg, 2-3 capsules p.o. t.i.d. with meals. 12.CellCept 1000 mg p.o. b.i.d. 13.Clonazepam 2 mg p.o. at bedtime. 14.Dialysis every Tuesdays, Thursdays and Saturdays. 15.Phenergan 25 mg p.o. b.i.d. p.r.n. for nausea or vomiting. 16.Prednisone 5 mg p.o. daily. 17.Prograf 3 mg p.o.  b.i.d. 18.Protonix 40 mg p.o. daily. 19.Renal vitamin 1 tablet p.o. at bedtime.  DISCONTINUED MEDICATIONS:  Norvasc 10 mg, Toprol-XL 100 mg, Actos.  PROCEDURES: 1. Placement of peripheral venous catheter under ultrasound guidance     by Interventional Radiology on September 15, 2010. 2. CT of the abdomen and pelvis with contrast on September 14, 2010,     impression; postoperative structures identified consistent with     history of pancreatic and renal transplant.  Atrophy and cyst     filled kidneys.  No renal excretion demonstrated.  No abdominal     abscess or inflammatory standing demonstrated.  Despite a     decompressed bladder, there is bladder wall thickening which could     represent cystitis or bladder hypertrophy.  IV contrast material     for the superficial subcutaneous structures on the left suggests no     occlusion or stenosis of the left subclavian or axillary vein. 3. Renal ultrasound on September 11, 2010, impression; bilateral native     renal atrophy, cortical thinning and increased echogenicity     compatible with medical renal disease.  No color-flow evident     within left lower quadrant transplant kidney. 4. CT of the abdomen and pelvis without contrast; impression;     a.  4 x 3 cm cystic structure adjacent to the left ovary, with      asymmetric enlargement of the left ovary.     b.     Bilateral renal atrophy, with numerous small cysts.  No      dominant mass seen.  Minimal small stones noted at the left renal      calyces.     c.     Diffuse calcification along the abdominal aorta and its      branches.  Advanced for age.     d.     Very small right inguinal hernia containing only fat. 5. Chest x-ray August 07, 2010, impression;     a.     Lungs clear.     b.     Mild vascular congestion and mild cardiomegaly.  LABORATORY DATA:  Renal panel yesterday before dialysis BUN 56, creatinine 8.76, potassium 4.7, phosphorus 5.3, calcium 8.1.  CBC hemoglobin 10.4,  hematocrit 33, white blood cell 6.2, MCV 104, platelets 221.  C difficile PCR was negative.  Hemoglobin A1c was 10.3.  TSH 5.045.  Urinalysis on September 07, 2010, showed too numerous to count white blood cells, 21-50 red blood cells, many bacteria and Trichomonas. Urine pregnancy test was negative on admission.  Cultures:  Blood cultures x2 from September 07, 2010, were no growth 5 days.  CONSULTATIONS: 1. Nephrology, Dr. Camille Bal. 2. Interventional Radiology.  Diet: Diabetic  Activity: adlib.  Complaints: None. Left back pain has improved.  Physical Exam: Gen: NAD, Afebrile and stable vital signs. Respiratory system: clear; CVS: S1 S2 heard, no JVD; Abdomen: Nondistended, soft, Normal bowel sounds heard; CNS: Awake, alert oriented, no focal neurological deficits.   HOSPITAL COURSE:  [name] is a 40 year old African American female patient with history of renal and pancreatic transplant which have possibly failed and the patient is now on hemodialysis Tuesdays, Thursdays and Saturdays, brittle type 1 diabetes mellitus, hypertension who presented with nausea, vomiting and left flank pain. 1. Presumed acute cystitis.  The patient was initially thought to have     acute pyelonephritis of the transplanted kidney.  She was started     empirically on Levaquin.  She was evaluated extensively including     CT of the abdomen and pelvis initially without and then     subsequently with contrast.  However, this did not show any     abnormality.  Despite treatment for the infection, she continued to     have left flank pain.  At this time, it was determined that this     flank pain was possibly musculoskeletal in etiology.  Her     antibiotics were completed. 2. Left flank pain, possibly musculoskeletal etiology.  This has     improved with pain and antispasm medications. 3. Brittle and poorly controlled type 1 diabetes mellitus.  She was     started on Lantus and titrated to 15 units at  bedtime with 3 units     t.i.d. with meals of NovoLog and sliding scale insulin.  However,     her oral intake has been erratic.  Her family will bring her food     from outside which she would eat at times and at other times she     would go without eating anything leading to fluctuations in blood     sugar both high as well as low.  Eventually, the diabetes treatment     program was consulted and they helped in  starting her back on the     above regimen with which her blood sugars are not optimal but these     could be titrated as an outpatient.  Currently, her sugars range     between 91-227 mg/dL.  She is advised to follow up with her primary     MD for close monitoring and adjustment of diabetes medications.     She may need an outpatient Endocrinology consultation. 4. Diarrhea, which was thought to be infectious.  C-difficile was     ruled out.  However, this resolved without any other treatment. 5. Trichomoniasis.  The patient received a dose of Flagyl and this is     treated. 6. A cyst in the pelvis.  This can be evaluated as an outpatient as     deemed necessary. 7. Status post pancreatic and renal transplant.  The renal physicians     tried to contact the transplant MD regarding tapering to eventually     discontinue her immunosuppressive medications but they were     unsuccessful in getting a response.  This can be followed up as an     outpatient.  DISPOSITION:  The patient is discharged home in stable condition.  FOLLOWUP RECOMMENDATIONS: 1. With Dr. Dorothyann Peng.  The patient is to call for an appointment     to be seen in 5-7 days. 2. With Dr. Camille Bal.  The patient is to call for an     appointment. 3. Dr. Ruel Favors.  Again, the patient is to call for an appointment. 4. Hemodialysis.  The patient is to keep up her appointments on     Tuesdays, Thursdays and Saturdays.  Time taken in coordinating this discharge is 45 minutes.     Marcellus Scott,  MD     AH/MEDQ  D:  09/20/2010  T:  09/21/2010  Job:  [id number]  cc:   Duke Salvia. Eliott Nine, M.D. Candyce Churn. Allyne Gee, M.D. Dr. Ruel Favors  Electronically Signed by Marcellus Scott MD on 10/08/2010 04:30:46 PM

## 2010-10-10 NOTE — Assessment & Plan Note (Signed)
OFFICE VISIT   CALVARY, DIFRANCO  DOB:  1970-06-24                                       03/01/2010  CHART#:[id number]   [name] had a basilic vein transposition in the right arm in March  2011.  It was a fairly short vein but we felt in the importance try to  get a fistula in her.  She later developed some steal symptoms and  underwent a drill procedure on December 30, 2009.  This resolved her steal  symptoms in the right arm.  She comes in for routine wound check.   On examination, the fistula has an excellent thrill.  She has a palpable  radial pulse and she has had no further symptoms in the right arm.   I think it is now safe to use her basilic vein transposition for  dialysis.  Will see her back p.r.n.     Di Kindle. Edilia Bo, M.D.  Electronically Signed   CSD/MEDQ  D:  03/01/2010  T:  03/02/2010  Job:  [id number]   cc:   [facility]

## 2010-10-10 NOTE — Assessment & Plan Note (Signed)
OFFICE VISIT   [name], [name]  DOB:  [birth year]                                       09/30/2009  CHART#:[id number]   REASON FOR VISIT:  Right arm pain and numbness status post right basilic  vein transposition.   The patient is a 40 year old black female with end-stage renal disease,  immunosuppressant secondary to transplant status, hypertension, diabetes  mellitus, medical noncompliance and pancreas transplant with failed  renal transplant.  She has had several hemodialysis access procedures on  her left upper extremity and it is not felt there is any further option  on the left arm.  On 08/03/2009 Dr. Edilia Bo took her to the operating  room and performed a right upper arm basilic vein transposition and  inserted a right subclavian vein Palindrome catheter at that time.  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  She in fact had had a venogram prior looking at her  left subclavian vein and it was felt to be occluded or stenotic based on  the large number of collaterals.  He did try to place a catheter on the  left side but was unable.  The patient reports that since surgery her  right upper extremity has been painful and her right hand has been numb.  She feels that her symptoms have worsened over time.  She also notes  that her right upper extremity has intermittent swelling, so much so  that she is unable to wear her rings at times.  She says that the pain  is throbbing in nature and is interfering with her sleep.  She said  Tylenol does not help relieve the pain.  The pain involves almost her  entire arm from just below the shoulder to her fingertips.  She said her  symptoms are more medial and the numbness involves her entire hand.   She underwent multiple studies at our office today.  She had a right  upper arm venous duplex  which was negative for DVT.  She also had a  duplex of her fistula.  This showed increased velocities in the proximal  graft approximately 2 cm above the anastomosis which measured 706/453  cm/sec.  There were also elevated velocities in the mid fistula at 543  and 316 cm/sec and 806/553 cm/sec.  With the fistula open she had  triphasic radial Doppler forms and both radial and ulnar flow appeared  antegrade.  The vascular technician also noted that the fistula measured  approximately 6 mm in diameter.   PHYSICAL EXAM FINDINGS:  Showed a blood pressure of 156/97, heart rate  of 80, temperature 97.9.  Her right upper extremity had minimal  generalized edema.  She was wearing a ring in fact she was wearing a  ring on her right hand.  She had a 2+ radial pulse on the right.  Her  hand was warm.  Movement was good.  Her right upper arm fistula was  easily palpable with excellent thrill.  There was no erythema noted.   The patient presents with numbness, pain and swelling of her right lower  extremity.  At this time the swelling does not appear that significant.  It is possible that the right subclavian catheter is  contributing and  the swelling may improve once this is discontinued.  As mentioned there  was no evidence of DVT.  She was encouraged to continue to elevate her  right upper extremity as much as possible.  In regards to her pain and  numbness based on the tests we have had there is no clear indication  that she has a steal as she has triphasic flow and palpable radial pulse  and her hand is warm to touch.  It is possible that she is experiencing  some neurapraxia from the actual basilic vein harvest.  I did discuss  this with Dr. Edilia Bo.  Will plan to try her on Neurontin 100 mg p.o.  q.h.s. to see if this improves her symptoms.  She was also prescribed  Percocet 5/325 mg 1 tablet p.o. q.4 h p.r.n. pain with 30 pills  prescribed.  Of note, she does report an allergy to Tylox but  says she  can take Percocet brand without difficulty.  Her reaction to Tylox was  itching.  She also reports allergy to Flagyl and Vicodin and Ultram.  We  have discussed her following up with Dr. Edilia Bo in approximately 3  weeks to reevaluate her symptoms.  She feels that she can tolerate her  symptoms in the meantime.  She however did verbalize that if her  symptoms did not improve she wanted something done about it.  Since  she appeared to have adequate flow with the fistula opened it is unclear  whether even ligating her fistula will actually help improve the  symptoms.  Furthermore this would limit her future hemodialysis access  site and she at this time refuses to even consider a thigh access.  Again, Dr. Edilia Bo will plan to see her back in 3 weeks.  She will call  us sooner if she is having progression of her symptoms.   Jerold Coombe, PA   V. Charlena Cross, MD  Electronically Signed   AWZ/MEDQ  D:  09/30/2009  T:  10/01/2009  Job:  [id number]   cc:   Duke Salvia. Eliott Nine, M.D.

## 2010-10-10 NOTE — Procedures (Signed)
CEPHALIC VEIN MAPPING   INDICATION:  Evaluation of AVF placement.   HISTORY:  Renal failure.   EXAM:  The right cephalic vein is compressible.   Diameter measurements range from 0.17 to 0.11 cm.   The right basilic vein is compressible.   Diameter measurements range from 0.21 to 0.34 cm in the upper arm only.   See attached worksheet for all measurements.   IMPRESSION:  Patient's basilic and cephalic veins are not of acceptable  diameter for use as a dialysis access site.   ___________________________________________  Di Kindle. Edilia Bo, M.D.   CJ/MEDQ  D:  07/28/2009  T:  07/28/2009  Job:  [id number]

## 2010-10-10 NOTE — Procedures (Signed)
DUPLEX DEEP VENOUS EXAM - UPPER EXTREMITY   INDICATION:  Swelling   HISTORY:  Edema:  yes  Trauma/Surgery:  AV fistula on 08/03/2009  Pain:  yes  PE:  no  Previous DVT:  no  Anticoagulants:  no  Other:   DUPLEX EXAM:                                             Bas/                IJV   SCV     AXV    BrachV  Ceph V                R  L  R   L   R  L   R   L   R  L  Thrombosis    0  0  0   0   0  0   0   0   0  0  Spontaneous   +     +       +      +       +  Phasic        +     +       +      +       +  Augmentation  +     +       +      +       +  Compressible  +     +       +      +       +  Competent     +     +       +      +       +  Legend:  + - yes  o - no  p - partial  D - decreased   IMPRESSION:  Right arm appears to be negative for deep venous  thrombosis.   ___________________________________________  V. Charlena Cross, MD   NT/MEDQ  D:  09/30/2009  T:  09/30/2009  Job:  [id number]

## 2010-10-10 NOTE — Assessment & Plan Note (Signed)
OFFICE VISIT   KADYNCE, BONDS  DOB:  [birth year]                                       10/14/2008  CHART#:[id number]   The patient is a 40 year old female with end-stage renal failure,  patient dialysis Monday, Wednesday, Friday at [facility]  She has a left upper arm loop graft for hemodialysis.  Most recent revision of the graft carried out September 10, 2008, for an  aneurysm of the venous limb.   She presents today for follow-up and concern over development of a new  aneurysm.   Her graft is patent.  The venous limb of the graft shows mild  dilatation.  No frank aneurysm formation.  No ulceration.  No history of  bleeding.   BP 101/67, pulse is 86 per minute.   I have reassured the patient that there is no evidence of major aneurysm  formation at this time.  Would not recommend further revision of her  graft at this time.  I instructed her to assure that they do stick away  from this site in the future.   Balinda Quails, M.D.  Electronically Signed   PGH/MEDQ  D:  10/14/2008  T:  10/15/2008  Job:  [id number]   cc:   [facility]

## 2010-10-10 NOTE — H&P (Signed)
HISTORY AND PHYSICAL EXAMINATION   December 22, 2009   Re:  [name]               DOB:  05-Oct-1970   Date of planned admission is 12/30/2009.   REASON FOR ADMISSION:  Steal syndrome right upper extremity.   HISTORY:  This is a pleasant 40 year old woman with a history of chronic  kidney disease who underwent a basilic vein transposition on the right  upper extremity on 08/03/2009.  She has had persistent problems with  pain in the right hand since surgery and we have tried conservative  treatment, however, her symptoms have not improved.  She is currently  being dialyzed via her Palindrome catheter.  She dialyzes on Tuesdays,  Thursdays and Saturdays.  Of note, she has had no recent uremic  symptoms.  She denies any history of fatigue, anorexia, nausea,  vomiting, palpitations or significant shortness of breath.  Her only  complaint is the pain in her right hand which is fairly constant.   Her past medical history is significant for diabetes, hypertension and a  history of a pancreas transplant.  She denies any history of myocardial  infarction, congestive heart failure or COPD.   SOCIAL HISTORY:  She denies any tobacco or alcohol use.   FAMILY HISTORY:  Significant for diabetes, hypertension and end-stage  renal disease.  She is unaware of any history of premature  cardiovascular disease.   REVIEW OF SYSTEMS:  GENERAL:  She has had no recent weight loss, weight  gain or problems with her appetite.  CARDIOVASCULAR:  She has had no chest pain, chest pressure, palpitations  or arrhythmias.  She has had no history of stroke or TIAs.  She has had  no history of DVT.  GI, pulmonary, hematologic, ENT, musculoskeletal, psychiatric and  neurologic review of systems is unremarkable.   MEDICATIONS:  1. CellCept 250 mg p.o. daily.  2. Prograf, she does not know the dose (daily).  3. Toprol, she does not know the dose (daily).  4. Norvasc, she did not know the  dose (daily).  5. Aspirin 81 mg p.o. daily.  6. Clonazepam p.r.n.  7. Ambien p.r.n.   PHYSICAL EXAMINATION:  General:  This is a pleasant 40 year old woman  who appears her stated age.  Vital signs:  Her blood pressure is 183/70,  heart rate is 99, temperature is 98.6.  HEENT:  Unremarkable.  Lungs:  Are clear bilaterally to auscultation.  Cardiac:  She has a regular rate  and rhythm.  She has palpable femoral pulses and warm, well-perfused  feet.  She has palpable brachial and radial pulse bilaterally.  The  right radial pulse does augment with compression of her fistula.  She  has a functioning right upper arm AV fistula.  Abdomen:  Soft,  nontender.  Neurological:  She has no focal weakness or paresthesias.  Skin:  There are no ulcers or rashes.   The patient continues to have significant steal symptoms in her right  arm.  We have previously discussed the option of ligating her fistula  and considering her for a thigh graft which she is very much opposed to.  The only other consideration is the DRIL procedure and she would like to  proceed with this to help hopefully alleviate her steal symptoms and  still preserve her fistula.  We have discussed the procedure and the  potential complications including but not limited to arm swelling, wound  healing problems, failure of the  procedure to relieve her steal symptoms  and graft thrombosis.  We have mapped her right greater saphenous vein  which appears to be adequate for a bypass conduit.  Her procedure has  been scheduled for 08/05 which is a nondialysis day.     [name]. Edilia Bo, M.D.  Electronically Signed   CSD/MEDQ  D:  12/22/2009  T:  12/23/2009  Job:  [id number]   cc:   [facility]

## 2010-10-10 NOTE — H&P (Signed)
**Note [name]** [name], [name] NO.:  [id number]   MEDICAL RECORD NO.:  [id number]          PATIENT TYPE:  EMS   LOCATION:  MAJO                         FACILITY:  [facility]   PHYSICIAN:  Vania Rea, M.D. DATE OF BIRTH:  Feb 22, 1971   DATE OF ADMISSION:  03/18/2008  DATE OF DISCHARGE:                              HISTORY & PHYSICAL   PRIMARY CARE PHYSICIAN:  Unassigned.   NEPHROLOGIST:  Dr. Casimiro Needle.   CHIEF COMPLAINT:  Fever, cough and cold, pains all over.   HISTORY OF PRESENT ILLNESS:  This is a 40 year old African American lady  with a history of diabetes, hypertension and end-stage renal disease,  dialysis dependent.  The patient is status post pancreatic and kidney  transplant in 2004, and her diabetes was significantly improved.  She  had to start dialysis in April 2008.  She is dialyzed Tuesday, Thursday  and Saturday.  The patient was in fair health until two days ago when  she started with fever, coughing, cold, chills and pains all over.  The  pain is worse with coughing.   PAST MEDICAL HISTORY:  As noted above.   MEDICATIONS:  1. Aspirin 81 mg daily.  2. Norvasc 10 mg daily.  3. Toprol XL 100 mg daily.  4. Phenergan 25 mg p.r.n.  5. Ambien CR 12.5 mg at bedtime.  6. Cellcept 1 gm twice daily.  7. Prograf 3 mg twice daily.  8. Klonopin 1 mg twice daily.  9. Dialyvite 1 tablet daily.  10.Glipizide 5 mg daily.  11.Prednisone 5 mg daily.   ALLERGIES:  NO KNOWN DRUG ALLERGIES.   SOCIAL HISTORY:  Denies tobacco, alcohol or illicit drug use.  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   FAMILY HISTORY:  Significant for both parents with diabetes.  Father  with hypertension.  Her mother is on dialysis.   REVIEW OF SYSTEMS:  Ten-point review of systems other than noted above  unremarkable.   PHYSICAL EXAMINATION:  GENERAL:  A young African American lady lying in  bed, mildly distressed.  VITAL SIGNS:  Temperature  is 100 degrees orally, her pulse was initially  115, now 97, respirations 24, blood pressure 148/87.  Saturations are  99% on 2 liters.  HEENT:  Her pupils are round, equal and reactive.  Mucous membranes are  injected.  NECK:  She has no cervical lymphadenopathy or thyromegaly.  CHEST:  She has crackles down the right side of her chest.  CARDIOVASCULAR:  Tachycardic.  ABDOMEN:  Obese, soft, nontender.  EXTREMITIES:  Without edema.  CENTRAL NERVOUS SYSTEM:  Cranial nerves II through XII are grossly  intact.  She has no focal neurologic deficits.   LABORATORY DATA:  Her white count is 10.2, hemoglobin 14.5, MCV 101.2,  platelets 232, absolute granulocyte count is 9.0, sodium is 138,  potassium 5.3, chloride 117, BUN 96, creatinine 17, glucose 98.  Chest x-  ray shows right lung infiltrate, suspicious for pneumonia.   ASSESSMENT:  1. Acute viral syndrome.  2. Right lower lobe pneumonia.  3. End-stage renal disease,  dialysis dependent.  4. Status post pancreatic and renal transplant.  5. Diabetes, controlled.  6. Chronic immunosuppression.   PLAN:  Will start this lady on Rocephin and Zithromax and also Tamiflu.  Contact the renal service for management of her dialysis.  Other plans  as per orders.      Vania Rea, M.D.  Electronically Signed     LC/MEDQ  D:  03/18/2008  T:  03/18/2008  Job:  [id number]   cc:   Mindi Slicker. Lowell Guitar, M.D.

## 2010-10-10 NOTE — Assessment & Plan Note (Signed)
OFFICE VISIT   ALLSION, NOGALES  DOB:  Oct 07, 1970                                       04/28/2009  CHART#:[id number]   The patient had a new left upper arm graft placed on 01/12/2009.  This  is a 40 year old woman who had a previous left upper arm loop graft  placed with multiple revisions including replacement of the arterial and  venous halves of the graft.  She had a whole new upper arm loop graft  placed around her old graft.  This had been working well for a while  although at the most recent dialysis sessions she has had significant  problems with flow within the graft and she has been told there is  significant clot within the venous limb of the graft.  The graft itself  has a palpable thrill and an audible bruit in both the arterial and  venous halves of the graft.   Given that the graft is not functioning I think the best option is to  simply explore the graft and perform surgical thrombectomy for  presumably some adherent clot within the venous half of the graft.  The  other consideration would be fistulogram and thrombolysis although if  she has significant adherent clot it may be more difficult to approach  this with thrombolysis.  If we are unable to salvage the graft she could  potentially require a Palindrome catheter.  We scheduled her surgery for  05/04/2009.  All of her questions were answered and she is agreeable to  proceed.  Of note, she was also referred because of a question of an AV  graft aneurysm although I did not see any specific aneurysm of the  graft.  We can potentially shoot an intraoperative arteriogram when she  undergoes thrombectomy of her graft and further assess this.   Di Kindle. Edilia Bo, M.D.  Electronically Signed   CSD/MEDQ  D:  04/28/2009  T:  04/29/2009  Job:  [id number]   cc:   [facility]

## 2010-10-10 NOTE — Procedures (Signed)
CEPHALIC VEIN MAPPING   INDICATION:  End-stage renal disease.   HISTORY:  End-stage renal disease.   EXAM:  Left cephalic vein mapping.   The left cephalic vein is compressible.   Diameter measurements range from 0.2 cm to 0.32.   See attached worksheet for all measurements.   IMPRESSION:  The patient's left upper-arm cephalic vein which is of  acceptable diameter for use as a dialysis access site.   ___________________________________________  Di Kindle. Edilia Bo, MD   MG/MEDQ  D:  12/31/2006  T:  12/31/2006  Job:  [id number]

## 2010-10-10 NOTE — Assessment & Plan Note (Signed)
OFFICE VISIT   NORETTA, FRIER  DOB:  [birth year]                                       05/17/2010  CHART#:[id number]   I saw this patient in the office today for followup after recent removal  of 2 infected grafts from her left upper arm.  One was a nonfunctional  graft and one was a functional graft.  She had a bovine pericardial  patch angioplasty to the left brachial artery.  She has a functioning  right basilic vein transposition for dialysis.  She comes in to have her  sutures removed.   PHYSICAL EXAMINATION:  Vital signs:  On examination, blood pressure is  180/104, heart rate is 76, temperature 97.3.  Incisions are healed  nicely and we removed her sutures in the office today.   Overall I am pleased with her progress and will see her back p.r.n.     Di Kindle. Edilia Bo, M.D.  Electronically Signed   CSD/MEDQ  D:  05/17/2010  T:  05/18/2010  Job:  [id number]

## 2010-10-10 NOTE — Consult Note (Signed)
NEW PATIENT CONSULTATION   [name], [name]  DOB:  07-13-1970                                       12/31/2006  CHART#:[id number]   REASON FOR CONSULTATION:  I saw [name] in the office today in  consultation for hemodialysis access.  She was referred by Dr. Britt Bottom C.  Powell.   HISTORY OF PRESENT ILLNESS:  This is a pleasant 40 year old, right-  handed woman who has been on dialysis since April of this year.  She is  currently using a Diatek catheter.  Her end-stage renal disease I  believe is secondary to diabetes and hypertension.   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  The pancreatic  transplant is functioning. She denies any history of cardiac disease.   SOCIAL HISTORY:  She does not use tobacco.   REVIEW OF SYSTEMS:  She has had no recent chest pain, chest pressure,  palpitations or arrhythmias. The review of systems is otherwise  documented on the medical history form in her chart.   PHYSICAL EXAMINATION:  VITAL SIGNS:  Blood pressure 138/91 on the right,  133/87 on the left, heart rate 84.  PULSES:  She has a palpable brachial and radial pulse bilaterally.  I do  not see a usable forearm cephalic vein on either side.   We mapped her cephalic vein in the office today and the upper arm  cephalic vein is somewhat marginal in size.  The forearm cephalic vein  did not appear adequate for a fistula.   RECOMMENDATIONS:  I have recommended that we explore her upper arm  cephalic vein on the left.  If this is adequate, will place an upper arm  AV fistula.  If this is not adequate, then we will place an AV graft.  I  have discussed the indications of the procedure and the potential  complications including, but not limited to failure of the fistula to  mature, graft thrombosis, graft infection, steal syndrome, would healing  problems, bleeding and arm swelling.   All of her questions are answered  and she is agreeable to proceed.  Her surgery has been scheduled for  January 16, 2007.   Di Kindle. Edilia Bo, M.D.  Electronically Signed   CSD/MEDQ  D:  12/31/2006  T:  01/01/2007  Job:  [id number]   cc:   Mindi Slicker. Lowell Guitar, M.D.

## 2010-10-10 NOTE — Procedures (Signed)
VASCULAR LAB EXAM   INDICATION:  Right arm pain and swelling.  AV fistula placed on  08/03/2009.   HISTORY:  Diabetes:  No.  Cardiac:  No.  Hypertension:  Yes.   EXAM:  Eval AVF.   IMPRESSION:  1. Patent AVF with 3 areas of increased velocities.  Increased      velocities noted in proximal graft of 706/453 cm/second, increased      velocities in the mid graft of 543/316 cm/second, and increased      velocities in mid to distal graft of 806/553 cm/second.  2. No obvious area of stenosis or narrowing noted in areas of high      velocities.  3. Radial artery and ulnar artery appear to have antegrade flow.   ___________________________________________  V. Charlena Cross, MD   NT/MEDQ  D:  09/30/2009  T:  09/30/2009  Job:  [id number]

## 2010-10-10 NOTE — Op Note (Signed)
[name], [name]               ACCOUNT NO.:  [id number]   MEDICAL RECORD NO.:  [id number]          PATIENT TYPE:  AMB   LOCATION:  SDS                          FACILITY:  [facility]   PHYSICIAN:  Di Kindle. Edilia Bo, M.D.DATE OF BIRTH:  February 07, 1971   DATE OF PROCEDURE:  01/12/2009  DATE OF DISCHARGE:  01/12/2009                               OPERATIVE REPORT   PREOPERATIVE DIAGNOSIS:  Chronic kidney disease.   POSTOPERATIVE DIAGNOSIS:  Chronic kidney disease.   PROCEDURE:  Placement of a new left upper arm arteriovenous graft  (redo).   SURGEON:  Di Kindle. Edilia Bo, MD   ASSISTANTJeanice Lim, RNFA   ANESTHESIA:  MAC.   INDICATIONS:  This is a 40 year old woman who had a previous left upper  arm loop graft placed and had multiple revisions including replacement  of the arterial and venous halves to the graft.  At the time of her most  recent thrombectomy, the venous anastomosis was widely patent, however,  it appeared that she had a central venous obstruction.  She underwent a  postoperative fistulogram which showed that she did have a central  venous stenosis centrally, however, there was an adjacent axillary vein  which could potentially be used for outflow.  Therefore, she was  scheduled for placement of a new graft.   TECHNIQUE:  The patient was taken to the operating room and sedated by  Anesthesia.  The left upper extremity was prepped and draped in the  usual sterile fashion.  Previous incision in the axilla was opened and  the arterial and venous limbs of the graft were dissected free.  The  graft at the venous end was anastomosed to the axillary vein which was  quite dilated.  This was ligated and the old venous anastomosis excised.  I then dissected out the brachial artery proximal and distal to the  arterial anastomosis.  Behind the brachial artery, I was able to  identify the vein which had been identified on her venogram.  This was  fairly deep and posterior to  the artery somewhat laterally, however, it  was felt to be an adequate vein.  Using one distal counterincision, a 4-  7 mm graft was then tunneled in a loop fashion in the upper arm with the  arterial aspect of the graft along the medial aspect of the upper arm.  The patient was then heparinized.  The brachial artery was then clamped  proximally and distally to the previous anastomosis.  The 4 mm of the  graft was then divided and spatulated and the arterial anastomosis was  widely patent and it took a 4.5-mm dilator.  The new segment of tapered  graft was then spatulated and sewn end-to-end to the old graft using  continuous 6-0 Prolene suture.  The graft was reported to be appropriate  length for anastomosis to the high axillary vein.  The vein was ligated  distally and spatulated proximally.  The graft was cut to the  appropriate length, spatulated, and sewn end-to-end to the vein using  continuous 6-0 Prolene suture.  At the completion, there was an  excellent thrill in the graft and a good radial and ulnar signal with  the Doppler.  Hemostasis was obtained in  the wounds.  The wounds were closed with a deep layer of 3-0 Vicryl.  The skin closed with 4-0 Vicryl.  Sterile dressing was applied.  The  patient tolerated the procedure well and was transferred to recovery  room in satisfactory condition.  All needle and sponge counts were  correct.      Di Kindle. Edilia Bo, M.D.  Electronically Signed     CSD/MEDQ  D:  01/12/2009  T:  01/12/2009  Job:  [id number]

## 2010-10-10 NOTE — Assessment & Plan Note (Signed)
OFFICE VISIT   [name], [name]  DOB:  1970-12-19                                       01/28/2009  CHART#:[id number]   The patient presents today for followup of her new left upper arm AV  graft placed by Dr. Edilia Bo on 01/12/2009.  She had called concerning  pain in her upper arm.   PHYSICAL EXAM:  She has excellent healing of both the antecubital and  the axillary incision.  She has a good thrill in the graft.  There is no  erythema and no evidence of wound problems.   She was reassured about this and will continue dialysis via her catheter  for a total of 4 weeks out from surgery and then will have access of her  AV graft.  She did not request any narcotic pain medications today and  was not written a new prescription.   Larina Earthly, M.D.  Electronically Signed   TFE/MEDQ  D:  01/28/2009  T:  01/29/2009  Job:  [id number]

## 2010-10-10 NOTE — Assessment & Plan Note (Signed)
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  Given that it was felt that this would likely fail,  ultimately she underwent a basilic vein transposition in the right arm  on 08/03/2009.  The vein was fairly short, but this matured nicely and  she has using this for dialysis.  This was complicated by a  postoperative steal syndrome, and she underwent a DRIL procedure on  12/30/2009 which resolved her steal symptoms.  She was seen by Dr.  Darrick Penna on 04/13/2010 with some drainage from her left upper arm graft  and was started on Keflex.  She came in today to have this reevaluated.  She states that she is having significant pain in the left arm graft.  She is unaware of any recent fever.   REVIEW OF SYSTEMS:  She has had no chest pain or chest pressure.   PHYSICAL EXAMINATION:  This is a pleasant 40 year old woman who appears  stated age.  Blood pressure 135/85, heart rate is 85, respiratory rate  is 16.  Lungs:  Clear bilaterally to auscultation.  Her right arm  basilic vein transposition has an excellent thrill, and she has a  palpable right radial pulse.  On the left side, there are no open wounds  on her graft, although the lateral aspect of the graft is somewhat  tender to palpation.  There is no erythema or drainage currently.   I have discussed the option of continuing with antibiotics and  conservative treatment, as I feel there is a chance that whatever  infection she has has cleared; however, she feels strongly that she is  having significant pain in the arm and wishes to have the graft removed.  I think she is at high risk for continued problems with graft infection  in the left arm,  and I think this is perfectly reasonable.  However, I  have explained this will be a bit of a challenge as it looks like she  has 2 grafts in his arms and we will have to do a fair amount of  dissection to remove the grafts and also patch the artery.  However, she  is comfortable with this and understands the risks involved and would  like to proceed with removal of her grafts in the left arm.  This has  been scheduled for 05/02/2010 which would normally be a dialysis day for  her, and she will try to rearrange dialysis for Monday instead.  Hopefully this can be done as an outpatient.     [name]. [name], M.D.  Electronically Signed   CSD/MEDQ  D:  04/26/2010  T:  04/27/2010  Job:  [id number]   cc:   [facility]

## 2010-10-10 NOTE — H&P (Signed)
NAMECAROLYNN, TULEY NO.:  [id number]   MEDICAL RECORD NO.:  [id number]          PATIENT TYPE:  INP   LOCATION:  2112                         FACILITY:  [facility]   PHYSICIAN:  Leslye Peer, MD    DATE OF BIRTH:  1970/09/16   DATE OF ADMISSION:  07/16/2008  DATE OF DISCHARGE:                              HISTORY & PHYSICAL   CHIEF COMPLAINT:  Dyspnea, weakness and fever.   HISTORY:  [name] is a 40 year old woman with history of chronic  kidney disease on hemodialysis secondary to hypertension and diabetes  mellitus.  She is status post a renal and pancreatic transplant.  Her  renal transplant has failed secondary to medical noncompliance and she  is now on hemodialysis on Tuesdays, Thursdays and Saturdays via a left  upper extremity graft.  She frequently misses dialysis and stopped her  dialysis early before her complete treatment is completed.  She tells me  this evening that she was well until she developed fever just prior to  her hemodialysis yesterday, July 15, 2008, then after the procedure  which was ended early, she felt fatigued, dyspneic and weak.  She went  home, went straight to bed, and was unable to get back up.  Her family  was looking for her and came to her home when she was missed.  They  gained access to her home and she was brought immediately to the  emergency department in mild-to-moderate respiratory distress.  She was  found to be hypoxemic and chest x-ray showed diffuse bilateral  infiltrates.  Critical Care Medicine is asked to evaluate and admit.   PAST MEDICAL HISTORY:  1. Chronic kidney disease on HD on Tuesdays, Thursdays and Saturdays.      Her dry weight is reported as 65 kg.  2. 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  3. Hypertension.  4. Anemia.  5. Diabetes mellitus.  6. History of admission in  December 2009 for bilateral infiltrates and      possible pneumonia.   ALLERGIES:  1. MORPHINE.  2. TRAMADOL.  3. ZOLOFT.   MEDICATIONS:  1. Aspirin 81 mg daily.  2. Metoprolol XL 100 mg daily.  3. CellCept 1 g b.i.d.  4. Prograf 3 mg b.i.d.  5. Clonazepam 1 mg b.i.d.  6. Prednisone 5 mg daily.  7. Glipizide 5 mg daily.  8. Actos 45 mg daily in the morning.  9. Protonix 40 mg daily.  10.Norvasc 10 mg daily.  11.Dialyvite vitamin once daily.  12.Trazodone 25 mg nightly.   SOCIAL HISTORY:  The patient lives alone.  She is unemployed.  She  denies any tobacco or alcohol use.   FAMILY HISTORY:  Significant for diabetes, hypertension and end-stage  renal disease.   PHYSICAL EXAMINATION:  GENERAL:  This is an anxious, overweight,  slightly confused woman who is easily reoriented.  She has increased  work of breathing and is coughing.  VITAL SIGNS:  Her  temperature is 98.3, blood pressure 145/94, heart rate  109, respiratory rate 26-39, SPO2 88-96% on 4 L per minute by nasal  cannula.  HEENT:  The oropharynx is moist.  She is coughing up thin pink  secretions.  LUNGS:  Bilateral diffuse inspiratory rales.  HEART:  Tachycardic, regular without a murmur.  ABDOMEN:  Soft, nontender with positive bowel sounds.  EXTREMITIES:  No cyanosis, clubbing or edema.  Her left upper extremity  graft has a good thrill.  NEUROLOGICAL:  She is awake.  She answers questions appropriately.  She  is oriented x3.   LABORATORY DATA:  White blood cell count 13.0 with 92% neutrophils,  hematocrit 26.1, platelets 210.  Total CPK is 1727.  Chemistries with  sodium 137, potassium 4.8, chloride 100, CO2 of 25, BUN 102, creatinine  17.2, glucose 107, ionized calcium was 0.76.  Blood cultures were drawn  in the emergency department on July 16, 2008, and are pending.  Chest x-ray showed diffuse bilateral alveolar infiltrates, no effusions  and cardiomegaly.   IMPRESSION:  1. Hypoxemic respiratory  failure secondary to bilateral infiltrates.      Etiology is unclear but I suspect pulmonary edema given the fact      that she did not complete her dialysis, did not take her      medications this morning and is hypertensive.  I suspect that she      has diastolic dysfunction.  Given her immunosuppressed status, we      must also consider healthcare-associated pneumonia or another      opportunistic process.  She has already received azithromycin and      ceftriaxone in the emergency department.  I will plan to broaden      her coverage to azithromycin plus vancomycin plus Zosyn plus      fluconazole until an etiology for her infiltrates can be      determined.  She still has some urine output, so I will give her      Lasix 160 mg x1.  She will likely benefit from ultrafiltration      tonight to avoid intubation, mechanical ventilation and Renal will      be consulted.  I will initiate noninvasive positive pressure      ventilation first since she has good mental status if she fails.  A      bronchoalveolar bottle performed if she gets intubated.  Her dry      weight is 65 kg.  We will check her weight when she gets to the      ICU.  2. Chronic kidney disease on HD.  Her electrolytes and acid base      status are good and the only urgent indication for hemodialysis is      her volume status.  We will involve Renal this evening.  3. 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  I will continue      her Prograf and CellCept.  I will also continue her prednisone 5 mg      daily, but we will change this stress-dose hydrocortisone if she      becomes hypotensive.  4. Hypertension.  She did not take her usual scheduled medications      today.  I will restart her Norvasc and      metoprolol in the morning on July 17, 2008 if her blood      pressure is acceptable.  5. Diabetes mellitus.  I will hold her oral agents and cover  her with      sliding scale insulin per protocol.  6.  Prophylaxis will be with sequential compression devices and      Protonix.      Leslye Peer, MD  Electronically Signed     RSB/MEDQ  D:  07/16/2008  T:  07/17/2008  Job:  [id number]   cc:   [facility]. Eliott Nine, M.D.

## 2010-10-10 NOTE — Assessment & Plan Note (Signed)
OFFICE VISIT   [name], [name]  DOB:  [birth year]                                       04/13/2010  CHART#:[id number]   The patient presented to the office today as an add-on patient  complaining of pain and swelling of her left upper extremity.  She has a  left upper arm AV graft currently which is functioning but has been  abandoned in favor of a new fistula on the right side.  Apparently the  left arm graft was abandoned due to concerns that the graft might fail  at some point in the future.  This graft was previously placed in August  of 2010.  Most recently she had a thrombectomy of it in December of  2010.   She denies any fever or chills.  She has had increased warmth and  tenderness over the graft for the last several days.  She also noticed a  new nodule over the mid section of the graft.  She has had no drainage  from this.   PHYSICAL EXAM:  Blood pressure is 134/87 in the left arm, temperature  98, respirations 16, heart rate 106 and regular.  Left upper arm is  tender to palpation over the graft.  There is a 2 mm nodule with no  expressible purulent material, no obvious abscess.  Left hand is warm  and well-perfused.  Right arm AV fistula has an easily palpable thrill,  audible bruit and worked well on dialysis today.   The patient currently has a functioning fistula in the right arm.  She  also has a functioning graft in the left upper arm which is currently  not being used.  There is possible graft infection.  We will place her  on Keflex 500 mg twice a day and see if we can sterilize this area.  If  the graft becomes more erythematous, more tender or begins to drain we  will need to consider graft removal.  She will follow up in two weeks'  time for further exam.     Janetta Hora. Fields, MD  Electronically Signed   CEF/MEDQ  D:  04/13/2010  T:  04/14/2010  Job:  [id number]   cc:   [facility]

## 2010-10-10 NOTE — Assessment & Plan Note (Signed)
OFFICE VISIT   [name], [name]  DOB:  01-31-1971                                       10/27/2009  CHART#:[id number]   I saw the patient in the office today for followup after placement of a  basilic vein transposition on 08/03/2009.  She had had multiple previous  access procedures on the left and a venogram demonstrated a subclavian  vein occlusion on the left.  We therefore elected to proceed with access  in the right arm.  The cephalic veins were not usable for cephalic vein  fistulas and we explored the basilic vein which was reasonable in size  although length was somewhat limited but we were able to perform a  basilic vein transposition on the right arm.  She also had a right  subclavian vein Palindrome catheter placed.  Note the right IJ was  occluded by ultrasound.  Since her surgery she has had some paresthesias  in the right hand which has been stable.  She has also had some  supraclavicular swelling related to her subclavian vein occlusion on the  left.  She has been exercising her hand and states there really are no  aggravating or alleviating factors associated with the paresthesias in  her hand.   On examination blood pressure is 126/83, temperature is 98.6, heart rate  is 87.  She has an excellent thrill in her right upper arm basilic vein  transposition.  She has a monophasic radial and ulnar signal with the  Doppler on the right which does augment with compression of her fistula.  Grip is intact on the right side.   I have explained that I think she does have evidence of a steal syndrome  in the right arm.  I have offered to ligate her fistula however we are  then left with having to consider new access which would likely need  placement of a thigh graft.  At this point she is not willing to  consider this.  As her symptoms have been stable I think it is  reasonable to continue to follow this for now.  I have encouraged her to  exercise  her hand as much as she can tolerate and I will plan on seeing  her back in 3 weeks.  If her symptoms progress we would have to consider  ligation of her fistula.  Hopefully her symptoms will gradually improve.  In the meantime she will continue using her catheter for dialysis.     Di Kindle. Edilia Bo, M.D.  Electronically Signed   CSD/MEDQ  D:  10/27/2009  T:  10/28/2009  Job:  [id number]   cc:   [employer]

## 2010-10-10 NOTE — Op Note (Signed)
[name], [name]               ACCOUNT NO.:  [id number]   MEDICAL RECORD NO.:  [id number]          PATIENT TYPE:  AMB   LOCATION:  SDS                          FACILITY:  [facility]   PHYSICIAN:  Larina Earthly, M.D.    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   PROCEDURE:  Revision with replacement of venous limb of left upper arm  arteriovenous Gore-Tex graft.   SURGEON:  Larina Earthly, MD   ASSISTANT:  Wilmon Arms, PA-C   ANESTHESIA:  LMA.   COMPLICATIONS:  None.   DISPOSITION:  To recovery room, stable.   PROCEDURE IN DETAIL:  The patient was taken to the operating room and  placed in the supine position where the left arm was prepped and draped  in the usual sterile fashion.  Incision was made over the graft near the  apex of the upper arm loop.  The graft was encircled with vessel loop.  Next, incision was made above the venous aneurysm and the graft was  exposed in this area as well.  The patient was given 5000 units of  intravenous heparin and after adequate circulation time, the artery was  occluded proximal and distal.  The graft was transected above and below  the aneurysm on the venous limb of the graft.  A 6-mm graft was brought  onto the field and was tunneled between the 2 incisions.  The graft was  cut to the appropriate dimension and was sewn end-to-end to the old  graft proximally with a running 6-0 Prolene suture.  This anastomosis  was tested and found to be adequate and was flushed with heparinized  saline, reoccluded.  The graft was cut to the appropriate length and  sewn end-to-end to the upper segment of the  venous limb with a running 6-  0 Prolene suture.  Clamps were removed and excellent thrill was noted.  The wounds were irrigated with saline.  Hemostasis was achieved with  electrocautery.  The wounds were closed with 3-0 Vicryl in the  subcutaneous and subcuticular tissue.  Benzoin and Steri-Strips were  applied.      Larina Earthly, M.D.  Electronically Signed     TFE/MEDQ  D:  09/10/2008  T:  09/11/2008  Job:  [id number]

## 2010-10-10 NOTE — Op Note (Signed)
[name], [name]               ACCOUNT NO.:  [id number]   MEDICAL RECORD NO.:  [id number]          PATIENT TYPE:  AMB   LOCATION:  SDS                          FACILITY:  [facility]   PHYSICIAN:  Di Kindle. Edilia Bo, M.D.DATE OF BIRTH:  1971-05-25   DATE OF PROCEDURE:  01/16/2007  DATE OF DISCHARGE:                               OPERATIVE REPORT   PREOPERATIVE DIAGNOSIS:  Chronic renal failure.   POSTOPERATIVE DIAGNOSIS:  Chronic renal failure.   PROCEDURE:  Placement of new left upper arm loop AV graft.   SURGEON:  Di Kindle. Edilia Bo, M.D.   ASSISTANT:  Zenaida Niece, RNFA   ANESTHESIA:  Local with sedation.   TECHNIQUE:  The patient was taken to the operating room and sedated by  anesthesia.  The left upper extremity was prepped and draped in the  usual sterile fashion.   After the skin was infiltrated with 1% lidocaine, a transverse incision  was made just above the antecubital level.  Here, the cephalic vein was  very small and did not think usable for fistula.  The brachial vein was  reasonable; however, the adjacent brachial artery was tiny.  I  interrogated with a Doppler, and in fact this was the brachial artery.  Given the small size of the artery, I elected to place an upper arm loop  graft.  A separate longitudinal incision was made beneath the axilla  after the skin was anesthetized.  The high brachial artery here was  larger, although still small.  The adjacent vein was good-sized.  A 4 to  7-mm graft was then tunneled in a loop fashion in the upper arm with the  arterial aspect of the graft along the lateral aspect of the upper arm.  The patient was heparinized.  The brachial artery was clamped proximally  and distally, and a longitudinal arteriotomy was made.  The vein was  mobilized over and sewn end-to-side to the artery using continuous 6-0  Prolene suture.  The graft was then pulled to the appropriate length for  anastomosis to the axillary vein.   The vein was ligated distally,  spatulated proximally.  The graft was widely spatulated and sewn end-to-  end to the vein using continuous 6-0 Prolene suture.  At the completion,  there was an excellent thrill in the graft and a radial and ulnar signal  with the Doppler.  Hemostasis was obtained in the wounds.  The wounds  were closed with deep layer of 3-0 Vicryl, the skin closed with 4-0  Vicryl.  A sterile dressing was applied.   The patient tolerated the procedure well and was transferred to the  recovery room in satisfactory condition.  All needle and sponge counts  were correct.     Di Kindle. Edilia Bo, M.D.  Electronically Signed    CSD/MEDQ  D:  01/16/2007  T:  01/17/2007  Job:  [id number]

## 2010-10-10 NOTE — Assessment & Plan Note (Signed)
OFFICE VISIT   [name], [name]  DOB:  11-09-1970                                       12/08/2009  CHART#:[id number]   I saw the patient in the office today for continued follow-up of her  right upper arm basilic vein transposition.  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  The fistula has matured nicely.  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  She complains of pain in the left hand which is fairly  constant and has been stable over the last several weeks.  There are  really no aggravating or alleviating factors.  She has not been using  her fistula for dialysis.  She has been using her catheter.   REVIEW OF SYSTEMS:  CARDIOVASCULAR:  She had no chest pain, chest  pressure, palpitations or arrhythmias.   PHYSICAL EXAMINATION:  Vital signs:  Temperature is 97.9, blood pressure  173/105, heart rate is 98, respiratory rate 16.  General:  This is a  pleasant 40 year old woman who appears her stated age.  Lungs:  Clear  bilaterally to auscultation.  She has an excellent thrill in her fistula  in the right arm.  I cannot palpate a radial pulse although she has an  excellent thrill in the fistula in her right arm.  She has diminished  but palpable right radial pulse.  She has a Doppler radial and palmar  arch signal on the right with augments with compression of her fistula.  The hand appears adequately perfused and she has good strength in the  right hand.   I am reluctant to ligate her fistula as this appears to have matured  nicely and it appears to be a good access option.  The fistula appears  to have matured nicely and should be usable for access.  I encouraged  her to exercise the hand as much as possible and  hope that her symptoms  gradually  improve.  Otherwise her options would be ligation of her  fistula which would likely related commit her to a thigh graft versus  considering the DRILL procedure to try to maintain function of the  fistula and  improve circulation to the hand.  I am going to see her  back in 3 weeks and we will  map her vein in the right leg in case we  would have to consider proceeding with the DRILL procedure for her steal  symptoms.  She knows to call sooner if she has problems.     Di Kindle. Edilia Bo, M.D.  Electronically Signed   CSD/MEDQ  D:  12/08/2009  T:  12/08/2009  Job:  [id number]   cc:   Mindi Slicker. Lowell Guitar, M.D.

## 2010-10-10 NOTE — Op Note (Signed)
NAMEKYNDEL, EGGER               ACCOUNT NO.:  [id number]   MEDICAL RECORD NO.:  [id number]          PATIENT TYPE:  AMB   LOCATION:  SDS                          FACILITY:  [facility]   PHYSICIAN:  Juleen China IV, MDDATE OF BIRTH:  1970/09/09   DATE OF PROCEDURE:  01/06/2009  DATE OF DISCHARGE:  01/06/2009                               OPERATIVE REPORT   PREOPERATIVE DIAGNOSIS:  End-stage renal disease.   POSTOPERATIVE DIAGNOSIS:  End-stage renal disease.   PROCEDURES PERFORMED:  1. Left upper arm dialysis graft study.  2. Ultrasound access.   PROCEDURE:  This patient was identified in the holding area and taken to  room 8 where he was placed supine on the table.  The left arm was  prepped and draped in standard sterile fashion.  Time-out was called.  The graft was evaluated with ultrasound and found to be widely patent.  The medial half of the graft (ulnar side) was accessed with a  micropuncture needle.  An 0.018 mandrel wire was advanced without  resistance and micropuncture sheath was placed.  Contrast injections  were performed through the sheath.  This reveals multisegment disease  and stenosis within the venous outflow tract.  Central veins are patent.  Over a Bentson wire up-sized the sheath to a 7-French sheath using a  Kumpe catheter and the Glidewire and Bentson wire I attempted to cross  the stenosis; however, I was unable to successfully cross the stenosis.  Based on the patient's long segment venous disease as well as the  presence of patent additional axillary vein, I felt this would be best  to revise surgically therefore, a decision was made to terminate the  procedure. Pursestring stitch (woggle) was used for hemostasis.  The  patient was taken to the holding area for recovery.   IMPRESSION:  Severe venous outflow stenosis.  Recommend surgical repair.      Jorge Ny, MD  Electronically Signed     V. Charlena Cross, MD  Electronically  Signed    VWB/MEDQ  D:  01/06/2009  T:  01/07/2009  Job:  [id number]

## 2010-10-10 NOTE — Assessment & Plan Note (Signed)
OFFICE VISIT   [name], [name]  DOB:  04-Jun-1970                                       07/28/2009  CHART#:[id number]   The patient has had problems with her left upper arm graft.  She had a  redo left upper arm graft done in August of 2010.  She presented because  the graft was not functioning well and she was having a lot of problems  with clotting.  We explored it and did a thrombectomy and there were  really no problems identified except possibly some outflow stenosis.  She subsequently has undergone a venogram which does show some central  venous stenosis on the right which is difficult to see, however, clearly  there is an issue because she has very extensive collaterals in this  region.  She underwent PTA of this area but continues to have  significant collaterals.  I think the reason she is having problems with  her graft clotting at dialysis is because of the central venous stenosis  as we have not been able to find any other problems which would explain  this.   On examination the graft is somewhat pulsatile.  It is patent.   I have explained that I really do not think there is anything different  we can do to this graft and if they are having problems with it not  functioning well at dialysis we need to consider new access.  She  underwent a vein mapping in the office today which I interpreted and  this shows that her forearm and upper arm cephalic vein is very small  and not usable for a fistula.  The basilic vein may possibly be usable  for a basilic vein transposition.  I have recommended that we explore  her basilic vein in the right arm and proceed with basilic vein  transposition if possible.  If this is not adequate then we would have  to place a graft in the right arm.  As they are still able to use her  left arm graft for now I think that it would be reasonable to hold off  on a catheter if possible.  Certainly we can place a catheter  if needed  and she can let us know how her graft is functioning on the left when  she shows up for surgery on 08/03/2009.  Hopefully we will be able to  get a fistula in her right arm.     Di Kindle. Edilia Bo, M.D.  Electronically Signed   CSD/MEDQ  D:  07/28/2009  T:  07/29/2009  Job:  [id number]

## 2010-10-10 NOTE — Assessment & Plan Note (Signed)
OFFICE VISIT   [name], [name]  DOB:  11-Aug-1970                                       11/23/2008  CHART#:[id number]   I saw the patient in the office today to evaluate an aneurysm along the  lateral aspect of her left upper arm loop AV graft.  I placed a new left  upper arm loop graft in August of 2008.  Most recently in April of 2002  Dr. Arbie Cookey replaced the venous limb of the graft.  She was sent for  evaluation for aneurysm along the arterial half of the graft which is  gradually enlarging in size.  She states that the graft has been  functioning well.  She dialyzes on Tuesdays, Thursdays and Saturdays.   REVIEW OF SYSTEMS:  She has had no recent fever or chills.   PHYSICAL EXAMINATION:  Blood pressure is 111/72, heart rate is 92.  Her  graft is functioning although somewhat pulsatile.  There is a moderate  sized aneurysm in the mid portion of the arterial half of the graft  which is along the lateral aspect of the upper arm.   Given the gradual continued enlargement of the aneurysm I would agree  that it would be best to replace the arterial half of the graft and this  has been scheduled for 09/10/2008.  We had to schedule it then in order  to work around her schedule as she has two trips planned.  I have asked  her to have the dialysis nurses stick the venous limb of the graft to be  sure that there is adequate access for both needles before we have to  replace the entire arterial half of the graft.  If this was not possible  we potentially would have to place a catheter at the same time which  would be nice to avoid.   Di Kindle. Edilia Bo, M.D.  Electronically Signed   CSD/MEDQ  D:  11/23/2008  T:  11/24/2008  Job:  [id number]   cc:   [employer]

## 2010-10-10 NOTE — Assessment & Plan Note (Signed)
OFFICE VISIT   [name], [name]  DOB:  1971-02-09                                       09/07/2008  CHART#:[id number]   I saw the patient in the office today concerning an aneurysm along the  venous aspect of her left upper arm AV graft.  She had a left upper arm  loop AV graft placed in August of 2008.  It has been functioning well  although she developed an aneurysm along the venous aspect of the graft  and therefore they are no longer cannulating the venous aspect of the  graft.  She was sent to have this aneurysm evaluated.   REVIEW OF SYSTEMS:  She has had no recent fever or chills.  The graft  has otherwise been working well.   PHYSICAL EXAMINATION:  This is a pleasant 40 year old woman who appears  her stated age.  Her blood pressure is 176/100, heart rate is 106.  Her  graft has an excellent thrill and bruit.  There was a focal aneurysm in  the central portion of the venous aspect of her graft along the ulnar  aspect of her upper arm.   I have recommended that we replace the venous half of her graft and  bypass around the aneurysm and that way they will then be able to  cannulate the venous half of the graft in approximately 4 weeks.  I  think the aneurysm has continued to gradually enlarge and could become  more problematic with time.  Her surgery has been scheduled for  09/10/2008.  This is a nondialysis day.   Di Kindle. Edilia Bo, M.D.  Electronically Signed   CSD/MEDQ  D:  09/07/2008  T:  09/08/2008  Job:  [id number]   cc:   Aram Beecham B. Eliott Nine, M.D.

## 2010-10-10 NOTE — Op Note (Signed)
[name], [name]               ACCOUNT NO.:  [id number]   MEDICAL RECORD NO.:  [id number]          PATIENT TYPE:  AMB   LOCATION:  SDS                          FACILITY:  [facility]   PHYSICIAN:  Di Kindle. Edilia Bo, M.D.DATE OF BIRTH:  [birth year]   DATE OF PROCEDURE:  12/29/2008  DATE OF DISCHARGE:  12/29/2008                               OPERATIVE REPORT   PREOPERATIVE DIAGNOSIS:  Chronic kidney disease.   POSTOPERATIVE DIAGNOSIS:  Chronic kidney disease.   PROCEDURE:  Thrombectomy of left upper arm arteriovenous graft.   SURGEON:  Di Kindle. Edilia Bo, MD   ASSISTANT:  Della Goo, PA-C   ANESTHESIA:  General.   INDICATIONS:  This is a 39 year old woman who had had a previous  revision of the venous half of her graft for an aneurysm.  Most  recently, approximately 2 weeks ago, I replaced the arterial half of the  graft because of an aneurysm.  She was set up to have the graft revised  as apparently she is having low flow in her graft.  On examination  preoperatively, the graft was pulsatile and I suspected a stenosis at  the venous anastomosis which had not been revised in a while.   TECHNIQUE:  The patient was taken to the operating room and received a  general anesthetic.  The left upper extremity was prepped and draped in  the usual sterile fashion.  The incision in the axilla was opened and  the venous and arterial limbs of the graft were dissected free.  The  venous limb of the graft was anastomosed to a very large axillary vein.  There did not appear to be any evidence of intimal hyperplasia at the  anastomosis.  The patient was heparinized.  The venous limb of the graft  was divided.  The Fogarty catheter was passed the entire length of the  graft into the arterial anastomosis and there were no problems  identified with the arterial anastomosis of the graft itself.  No  thrombus was retrieved.  Next, the venous anastomosis itself was widely  patent and was  inspected the 4.5 and 5 dilator would pass through the  vein proximally.  However, there was an area of stenosis approximately 4  cm centrally.  This was confirmed with the Fogarty catheter.  Thus, the  patient had a central venous stenosis which could not be addressed  surgically.  The graft was then sewn back end-to-end with continuous 6-0  Prolene suture.  Hemostasis was obtained in the wound.  The wound was closed with deep  layer of 3-0 Vicryl and the skin was closed with 4-0 Vicryl.  Sterile  dressing was applied.  The patient tolerated the procedure well and was  transferred to the recovery room in satisfactory condition.  All needle  and sponge counts were correct.      Di Kindle. Edilia Bo, M.D.  Electronically Signed     Di Kindle. Edilia Bo, M.D.  Electronically Signed    CSD/MEDQ  D:  12/29/2008  T:  12/30/2008  Job:  [id number]

## 2010-10-10 NOTE — Consult Note (Signed)
NAMESUNDAY, KLOS NO.:  [id number]   MEDICAL RECORD NO.:  [id number]          PATIENT TYPE:  INP   LOCATION:  2112                         FACILITY:  [facility]   PHYSICIAN:  Fayrene Fearing L. Deterding, M.D.DATE OF BIRTH:  Jan 11, 1971   DATE OF CONSULTATION:  07/16/2008  DATE OF DISCHARGE:                                 CONSULTATION   __________, congestive heart failure, and pneumonia.   HISTORY OF PRESENT ILLNESS:  This is a __________ at the [facility].  She has had __________She also has hypertension, anemia,  secondary hyperparathyroidism.  She also has problems with severe  __________ She went home on Thursday after dialysis and had not been  heard from.  Today, family became concerned and called the police  __________couch confused.  Because of that, she was brought into the  emergency room.  She has pneumonia with some congestive heart failure,  and is being admitted.  She admits to coughing or shortness of breath.  She just states that she feels weak.  Her family noticed that she is not  herself mentally.  She has been apparently coughing, and has had some  blood-flecked phlegm.   PAST MEDICAL HISTORY:  1. __________ kidney and the pancreas does not __________.  2. She has hypertension.  3. Anemia.  4. Secondary hyperparathyroidism.  5. Dyslipidemia.  6. She was seen on May 17, 2008, with a heart failure at that      time.   HEALTH HISTORY:  Severe noncompliance.   CURRENT MEDICATION:  1. PhosLo 667 mg p.o. with mealst.i.d.  2. CellCept __________b.i.d.  3. Norvasc 10 mg a day.  4. Glipizide 5 mg __________.  5. __________ 12.5 mg a day.  6. Baby aspirin 81 mg a day.  7. Prednisone 5 mg a day.  8. Protonix 40 mg a day.  9. She is also on __________ we do not have those doses at the current      time .   FAMILY HISTORY:  Positive for diabetes and end-stage renal disease in  her mother who had __________  also hypertension.   SOCIAL HISTORY:  She lives alone.  Unemployed.  Does not drink or smoke.  Does not use any illegal drugs.   REVIEW OF SYSTEMS:  In general, she just felt bad. She denies headaches,  and has the cough and that is her primary complaint.  No diarrhea,  nausea, or vomiting.  She notes she is swelling.  She has had no chest  pain.  She notes she used to feel hot and had fevers.  She feels  extremely weak.  No specific muscle aches, pains, arthralgias, or  cephalgias.  Denies any neurologic symptoms.   PHYSICAL EXAMINATION:  GENERAL:  She is oriented x3.  Nonfocal.  VITAL SIGNS:  Blood pressure is 150/92, heart rate 109, temperature  98.7, and sating 90% on room air on 3.5 L.  HEENT:  She has diabetic retinopathy.  Reddened pharynx.  NECK:  Without masses or thyromegaly, but she has posterior cervical  adenopathy.  CARDIOVASCULAR:  Regular rhythm.  S4.  A grade 2/6 holosystolic murmur  heard best in the lower sternal border radiating to the apex.  PMI is 10  cm at the midclavicular line the fifth intercostal space.  Edema +1.  Pulses are decreased.  Dorsalis pedis is present.  ABDOMEN:  Positive bowel sounds.  Soft. Liver is down 3 cm, transplant  in the right lower quadrant was nontender, otherwise nontender.  LUNGS:  Diffuse rales and rhonchi.  SKIN:  Pretty dry.  She has an arteriovenous Gore-Tex graft on the left  upper arm.  NEUROLOGIC:  She moves all extremities.  Strength is full.  Cranial  nerves II through XII grossly intact. Deep tendon reflexes 1+/4+ except  patellar and Achilles, which were trace.   LABORATORY DATA:  Reveals a chest x-ray, which shows diffuse patchy  infiltrates bilaterally.  __________Sodium 137, potassium 4.8,  bicarbonate __________, CK 1727 AV fistula in the left arm with a bruit.   __________ has to be placed on the ventilator.  Clearly, __________.  1. Pneumonia.  She needs broad-spectrum antibiotics as she is clearly      immunocompromised hose.  2.  Hypertension.  We will decrease volume at this time and hold her      meds.  3. Altered mental status.  Suspect this is fever, infection.  Cannot      rule out some element of __________ overload.  4. Diabetes mellitus.  Will need support with  extra insulin at this      time.  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  Give      stress dose steroids.  6. Anemia.  We will give her EPO and check  __________at [facility].  7. Hypo__________hypokalemia.   PLAN:  1. Hemodialysis.  2. Antibiotics.  3. Cultures.  4. Increase prednisone.  5. Prograf.  6. CellCept.           ______________________________  Llana Aliment. Deterding, M.D.     JLD/MEDQ  D:  07/16/2008  T:  07/17/2008  Job:  [id number]

## 2010-10-10 NOTE — Procedures (Signed)
VASCULAR LAB EXAM   INDICATION:  Right greater saphenous vein mapping for DRIL procedure.   HISTORY:  Diabetes:  No.  Cardiac:  No.  Hypertension:  Yes.   EXAM:  Right greater saphenous vein mapping.   IMPRESSION:  The right greater saphenous vein is patent with diameters  that range from 0.65 to 0.31 groin to ankle.   ___________________________________________  Di Kindle. Edilia Bo, M.D.   NT/MEDQ  D:  12/22/2009  T:  12/22/2009  Job:  [id number]

## 2010-10-10 NOTE — Assessment & Plan Note (Signed)
OFFICE VISIT   [name], [name]  DOB:  [birth year]                                       01/11/2010  CHART#:[id number]   I saw the patient in the office today for followup after her recent DRIL  procedure.  She had had a basilic vein transposition on the right arm  and had developed a persistent steal syndrome.  She underwent a DRIL  procedure on 12/30/2009.  She states that the pain in her hand has  resolved since the procedure.  She did develop some pain along the  incision, however, and 2 days ago had a small amount of drainage.  She  also noted some swelling in this area.  She came in to have this checked  out.   On examination she has a palpable radial pulse on the right and an  excellent thrill in her fistula.  The incision is intact.  I do not see  any erythema or drainage.  There is minimal swelling.  I have encouraged  her to simply wash the wound with soap and water and keep the skin  lubricated as it is quite dry.  I also will see her back in 2 weeks.  She knows to call sooner if she has problems.     Di Kindle. Edilia Bo, M.D.  Electronically Signed   CSD/MEDQ  D:  01/11/2010  T:  01/12/2010  Job:  [id number]   cc:   Duke Salvia. Eliott Nine, M.D.

## 2010-10-10 NOTE — H&P (Signed)
[name], [name]               ACCOUNT NO.:  [id number]   MEDICAL RECORD NO.:  [id number]          PATIENT TYPE:  INP   LOCATION:  6731                         FACILITY:  [facility]   PHYSICIAN:  Aram Beecham B. 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  She  missed the hemodialysis on Saturday due to the weather.  She presented  to the emergency room today complaining of headache, subjective fevers,  and shortness of breath.  Her chest x-ray showed pulmonary edema and a  possible right upper lobe infiltrate superimposed.  I-Stat potassium was  6.7.  She is admitted for acute hemodialysis, primarily for volume  overload and hyperkalemia as well as to obtain cultures and initiate  antibiotic therapy for possible pneumonia.  The patient reports that she  has had a hot and cold sensation for the past couple of days, chills  with subjective fever, and cough with purulent sputum but has not  looked at it.   PAST MEDICAL HISTORY:  1. ESRD secondary to diabetes - dialysis at [facility] Tuesday, Thursday, Saturday, 3-3/4 hours, 400 blood      flow, 800 dialysate flow, left upper arm AV graft, dry weight 65      kg, EPO 7800 units, and Hectorol 2 mcg.  2. Status post kidney-pancreas transplant with failed kidney      transplant - still on prednisone, Prograf, and CellCept with a      functioning pancreas (on oral agents).  3. Hypertension.  4. History of anemia, on Procrit.  5. Dyslipidemia.  6. Status post right lower lobe pneumonia in October 2009, treated at      that time with Rocephin,  Zithromax, and Tamiflu - did not finish      her course of antibiotics with Avelox at that point.  7. History of medical noncompliance.   CURRENT MEDICATIONS:  1. CellCept 250 mg 4 b.i.d.  2. Prednisone 5 mg daily.  3. Prograf 3 mg b.i.d.  4. Protonix 40 mg daily.  5. Dialyvite 1 daily.  6. Glipizide 5 mg nightly.  7. Actos 45 mg q.a.m.  8. Aspirin 81 mg daily.  9. Klonopin 1 mg b.i.d. p.r.n.  10.Norvasc 10 mg per day.  11.Toprol-XL 100 mg per day.   Family history is positive for diabetes, hypertension, and her mother  has end-stage renal disease, on dialysis.   SOCIAL HISTORY:  She is currently not employed, lives alone.  Does not  drink or smoke.  No illicit substances.   REVIEW OF SYSTEMS:  Positive for shortness of breath, cough, purulent  sputum, headache, nausea, vomiting, subjective fevers, and a feeling of  bloating and swelling.   On physical exam, she is  an ill-appearing black female who is coughing.  Blood pressure is 190/97.  She has a temperature of 99.1, weight is 73.8  kg with a dry weight of 65 kg.  She has marked periorbital and facial  edema and JVD approximately 5-6 cm.  Lungs were clear anteriorly.  Posteriorly, she had crackles at the right base with diminished breath  sounds overall at both bases.  She is tachycardic, S1 and S2.  No  audible S3.  Bowel sounds are normally active.  She has scars from her  kidney-pancreas transplant.  There is no tenderness.  She has 1+ edema  at the lower extremities.   Hemoglobin was 17.4, WBC 5600.  Potassium 6.7 by I-Stat, BUN 94,  creatinine 15.5.  Stat renal panel for repeat potassium as well as blood  cultures x2 are pending.  Chest x-ray shows cardiomegaly.  She has  bilateral airspace disease, density in the right upper lobe, and  obscuration of the right heart border.   IMPRESSION:  This is a 40 year old black female with end-stage renal  disease, pancreas transplant, hypertension who presents with:  1.  Volume overload with pulmonary edema and hyperkalemia due to missed      dialysis.  We will need urgent/emergent dialysis for these at this      time.  We will require dialysis again tomorrow as well to get to      her dry weight.  2. Possible superimposed pneumonia - obtain blood cultures x2 and      start Avelox.  3. Pancreas transplant - remains immunosuppressed on prednisone,      CellCept, and Prograf; does not require insulin but does take      glipizide and Actos, which will be continued  4. End-stage renal disease, on hemodialysis - as above.  5. Erythrocytosis.  She has a history of anemia, on EPO but currently      her hemoglobin is high, so EPO will be held.  6. Restless leg syndrome, on Klonopin.  7. Hypertension.  Continue meds.      Duke Salvia Eliott Nine, M.D.  Electronically Signed     CBD/MEDQ  D:  05/17/2008  T:  05/18/2008  Job:  [id number]

## 2010-10-10 NOTE — Op Note (Signed)
[name], [name]               ACCOUNT NO.:  [id number]   MEDICAL RECORD NO.:  [id number]          PATIENT TYPE:  OUT   LOCATION:  MDC                          FACILITY:  [facility]   PHYSICIAN:  Juleen China IV, MDDATE OF BIRTH:  [birth year]   DATE OF PROCEDURE:  DATE OF DISCHARGE:  03/04/2007                               OPERATIVE REPORT   PREOPERATIVE DIAGNOSIS:  End stage renal disease.   POSTOPERATIVE DIAGNOSIS:  End stage renal disease.   PROCEDURE PERFORMED:  Removal of right internal jugular Perma-Cath  placement.   PROCEDURE:  After informed consent, the patient was prepped and draped  in a standard sterile fashion.  Lidocaine 1% was used for local  anesthesia.  Scissors were used to free up the subcutaneous cuff.  Once  this was done, the catheter was easy to remove.  Manual pressure was  held for 5 minutes until hemostasis was achieved.  A sterile dressing  was applied.  There were no immediate complications.  The patient  tolerated the procedure well.      Jorge Ny, MD  Electronically Signed     VWB/MEDQ  D:  03/04/2007  T:  03/04/2007  Job:  [id number]

## 2010-10-10 NOTE — Op Note (Signed)
NAMECHRISTABELLE, [name]               ACCOUNT NO.:  [id number]   MEDICAL RECORD NO.:  [id number]          PATIENT TYPE:  AMB   LOCATION:  SDS                          FACILITY:  [facility]   PHYSICIAN:  Di Kindle. 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   PROCEDURE:  Ultrasound-guided placement of a right IJ palindrome 23-cm  catheter, also revision of left upper arm AV graft and replacement of  arterial half of graft with interposition 7-mm PTFE.   SURGEON:  Di Kindle. Edilia Bo, MD   ASSISTANT:  Della Goo, PA-C   ANESTHESIA:  Local with sedation.   TECHNIQUE:  The patient was taken to the operating room sedated by  Anesthesia.  The ultrasound scanner was used to mark both internal and  jugular veins.  The right IJ was quite small.  The left IJ appeared  larger.  The neck and upper chest were prepped and draped in the usual  sterile fashion.  After the skin was infiltrated with 1% lidocaine, the  left IJ was cannulated and the wire was passed; however, I was unable to  pass the dilator.  I tried even a much smaller dilator, there was  significant scar tissue present in the neck and after multiple attempts,  I elected to abandon the left side because of the risk of having to  force the dilator through this scar tissue.  I therefore elected to use  a right-sided approach.  The vein here was quite small.  I therefore  used direct ultrasound vision and visualized the right IJ, which was  directly cannulated and guidewire was passed into the right atrium, I  did have to use an angled Glidewire, but was unable to successfully pass  the wire.  The tract over the wire was then  dilated and then the dilator  and peel-away sheath were advanced over the wire and the wire and  dilator were removed.  The 23-cm palindrome catheter had been tunneled  and this was then passed through the peel-away sheath and positioned in  the right atrium.  At the completion, the catheter was secured at its  exit site with a 3-0 nylon suture.  The IJ cannulation site was closed  with a 4-0 subcuticular stitch.  Both ports withdrew easily.  We then  flushed with heparinized saline and filled with concentrated heparin.  The catheter was secured and sterile dressing was applied.  Next,  attention was turned to revision of the left upper arm graft.  The  aneurysmal segment was on the lateral aspect of graft along the arterial  half of the graft.  Through separate longitudinal incisions, the graft  was exposed proximally and distally along her lateral half of the graft.  The graft at each end was exposed.  The patient was then heparinized  after a 7-mm  graft was tunneled around the lateral to the old graft  between the two incisions.  Next, a Fogarty catheter was passed up the  venous limb of the graft.  After it was divided, there was some  obstruction centrally, although the graft irrigated without too much of  outflow obstruction.  The new segment of graft was sewn end-to-end with  continuous 6-0 Prolene suture.  Next, the graft was pulled to then  appropriate length for anastomosis of the arterial end of the graft.  The two ends were cut to the appropriate length and sewn end-to-end with  continuous 6-0 Prolene suture.  After completion, there was a good  thrill in the graft.  Hemostasis was obtained and the wounds were closed  with a  deep layer of 3-0 Vicryl, the skin closed with 4-0 Vicryl.  Sterile  dressing was applied.  The patient tolerated the procedure well and was  transferred to the recovery room in stable condition.  All needle and  sponge counts were correct.       Di Kindle. Edilia Bo, M.D.  Electronically Signed     CSD/MEDQ  D:  12/10/2008  T:  12/11/2008  Job:  [id number]

## 2010-10-13 NOTE — Discharge Summary (Signed)
[name], [name]               ACCOUNT NO.:  [id number]   MEDICAL RECORD NO.:  [id number]          PATIENT TYPE:  INP   LOCATION:  6731                         FACILITY:  [facility]   PHYSICIAN:  Aram Beecham B. Eliott Nine, M.D.DATE OF BIRTH:  May 31, 1970   DATE OF ADMISSION:  05/17/2008  DATE OF DISCHARGE:  05/19/2008                               DISCHARGE SUMMARY   DISCHARGE DIAGNOSES:  1. Noncardiogenic pulmonary edema.  2. Hyperkalemia.  3. Probable right upper lobe and right lower lobe pneumonia.  4. Pancreas transplant.  5. End-stage renal disease, on hemodialysis.  6. Secondary hyperparathyroidism.  7. Headache.  8. Anemia.  9. Hypertension.  10.Restless leg syndrome.  11.Medical noncompliance.   PROCEDURES:  1. Hemodialysis.  2. CAT scan of the head on May 18, 2008, showed no evidence of      acute infarction, mass, lesion, hemorrhage, hydrocephalus, or extra-      axial collection.  Negative head CT.   HISTORY OF PRESENT ILLNESS:  This is a 40 year old black female who has  a history of end-stage renal disease secondary to diabetes mellitus and  is status post kidney-pancreas transplant in the past but with failed  renal transplant who has been on hemodialysis Tuesday, Thursday,  Saturday at the [facility].  She missed the hemodialysis  Saturday due to the inclement weather.  She presented to the emergency  room today complaining of headache, subjective fevers, and shortness of  breath.  Her chest x-ray showed pulmonary edema and a possible right  upper lobe infiltrate superimposed.  I-Stat potassium was 6.7.  The  patient is admitted for acute hemodialysis primarily for volume overload  and hyperkalemia as well as to obtain cultures and initiate antibiotic  therapy for possible pneumonia.  The patient reports that she has had  hot and cold sensation for the past several days, chills with subjective  fever and cough with purulent sputum, but has not looked at  it.  Please  refer to the full H and P for additional information.   LABORATORY DATA:  On presentation, hemoglobin 11.4, white count 56,00,  potassium 6.7 by I-Stat, BUN 94, creatinine 15.5.   HOSPITAL COURSE:  1. Noncardiac pulmonary edema.  The patient was admitted emergently      for hemodialysis with a 1 potassium bath.  She had a net UF on      May 17, 2008, of 5.1 L.  She was taken again for dialysis on      May 18, 2008, but had a net UF of 0, in fact, she weighed 0.4      kg more post dialysis.  Repeat chest x-ray after the first      treatment showed essential resolution of her congestive heart      failure and persistence of questionable right upper lobe and left      lower lobe pneumonia.  Followup potassium on the second dialysis      treatment still showed an elevation of potassium at 5.1.  She was      dialyzed yet again on 3 hours of 1  K bath.  Repeat potassium on      May 19, 2008, showed a K of 5.7 with hemolysis.  Due to his      hemolysis, no intervention was done.  She will have a repeat      potassium done at her next dialysis treatment at [facility].  It was thought that her hyperkalemia and      fluid overload were basically due to missed hemodialysis; however,      kinetics will need to be monitored as well as access flow when she      returns to her Dialysis Center.  2. Hyperkalemia.  As discussed in noncardiac pulmonary edema.  3. Probable right upper lobe and right lower lobe pneumonia.  The      patient had blood cultures x2 drawn on admission.  These were      pending at the time of discharge and still pending at the time of      this dictation on May 22, 2008, with no growth.  White count      was never elevated, and the patient was afebrile throughout her      hospitalization.  She was discharged on Avelox to finish a 10-day      course.  4. Pancreas transplant.  The patient continued on her usual meds  for      diabetes, which were glipizide 5 mg in the morning and Actos 45 mg      per day.  She is on prednisone 5 mg per day.  She continued on her      same Prograf and CellCept throughout her hospitalization.  Blood      glucose levels were only checked as part of routine blood draws and      always found to be less than 110.  5. End-stage renal disease.  The patient continued on her usual      dialysis prescription.  There was no change in her prescription or      dry weight at the time of discharge.  6. Secondary hyperparathyroidism.  Labs on admission showed extremely      high phosphorus of 9.4 with a calcium of 7.6 and albumin of 3.2.      Her Hectorol was placed on hold until her calcium improved and then      it was resumed.  She has a history of noncompliance with      medications.  With the resumption of medications during her      hospitalization, her phosphorus was down to 6.2 on May 19, 2008, calcium was still low at 6.9.  She continues on Hectorol 2      mcg IV every dialysis and intact PTH will be monitored per      protocol.  7. Headache.  The patient had a headache on admission.  It was      persistent to the extent.  A head CT was done, which was found to      be negative.  Headache subsequently resolved.  8. Anemia.  Hemoglobin on admission was unusually high at 17.4.  Her      last hemoglobin prior to that had been 11.3 on April 29, 2008.      She had been receiving 7800 units of heparin with her dialysis      treatment.  Heparin was placed on hold.  Hemoglobin was rechecked  on the same day of admission and found to be 12.  Her hemoglobin on      May 18, 2008, was also 44 and on May 19, 2008, was 13.1.      Her Epogen is on hold.  She should have weekly hemoglobins while      she is not receiving Epogen.  9. Hypertension.  The patient's blood pressure was much lower than      usual.  Her metoprolol was placed on hold at the time of  discharge.      She will continue on Norvasc only at this time.  Dry weight and/or      blood pressure medicines may need to be adjusted in the future.      The patient understands that she is to hold her metoprolol.  Last      blood pressure prior to discharge was 137/71, it had dipped down      into the 90s on May 18, 2008, during her dialysis treatment.  10.Restless leg syndrome.  There is no change in the management during      her hospitalization, and she will continue on Klonopin b.i.d. as      needed.  11.Medical noncompliance.  As previously discussed.   CONDITION ON DISCHARGE:  Good.   DISCHARGE DISPOSITION:  The patient is to return home.  There were no  other dialysis changes in the patient's prescription other than that was  previously discussed.  The patient is to return to her usual Dialysis  Center on May 20, 2008.   DISCHARGE DIET:  Renal, diabetic, limit fluids to 1200 mL per day.   Activity is as tolerated.   DISCHARGE MEDICATIONS:  1. PhosLo 667 mg 3 with meals 3 times a day.  2. CellCept 250 mg 4 tablets twice a day.  3. Prograf 3 mg twice a day.  4. Norvasc 10 mg at bedtime.  5. Glipizide 5 mg in the morning.  6. Actos 45 mg in the morning.  7. Dialyvite 1 daily.  8. Ambien ER 12.5 mg daily at bedtime.  9. Baby aspirin 81 mg per day.  10.Prednisone 5 mg per day.  11.Protonix 40 mg daily.  12.Klonopin 0.5 mg twice a day as needed.  13.Avelox 400 mg 1 daily until gone, prescription was given for #7.   Greater than 45 minutes were spent completing the patient's discharge  paperwork, reviewing it with the patient, and dictating discharge  summary.      Weston Settle, P.A.      Duke Salvia Eliott Nine, M.D.  Electronically Signed    MB/MEDQ  D:  05/22/2008  T:  05/23/2008  Job:  [id number]   cc:   [facility]  [place] B. Eliott Nine, M.D.

## 2010-10-13 NOTE — Op Note (Signed)
[place]  Patient:    [name], [name]                      MRN: [id number] Proc. Date: 07/30/00 Adm. 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  OPERATION PERFORMED:  Pars plana vitrectomy, retinal photocoagulation, membrane peel, endocautery, right eye.  SURGEON:  Beulah Gandy. Ashley Royalty, M.D.  ASSISTANT:  Lu Duffel,, COA,SA  ANESTHESIA:  General.  DESCRIPTION OF PROCEDURE:  Usual prep and drape.  Peritomies at 8, 10 and 2 oclock.  4 mm angled infusion port anchored into place at 8 oclock. Contract lens ring anchored into place at 6 and 12 oclock.  The lighted pick and cutter were placed at 10 and 2 oclock respectively.  The pars plana vitrectomy was begun just behind the crystalline lens.  Methylcellulose was placed on the cornea and the flat contact lens was placed.  The vitrectomy was carried down to the macular surface where tractional detachment was seen. Multiple adhesions to the retina were found. These were circumcised, treated with endocautery and removed with the vitreous cutter.  A large stalk of vitreous was pulled free from its attachment to the disk and extended superiorly.  This was trimmed down to the minimum adhesion and the adhesion was circumcised.  A surface proliferation was removed out to the vitreous base and the vitreous base was trimmed with scleral depression.  A 30 degree prismatic lens was used for viewing.  Once all vitreous was removed for 360 degrees, the endolaser was placed in the eye and 622 burns were placed around the retinal periphery with a power of 400 mw, 1000 microns each and 0.1 second each.  The  washout procedure was performed.  The instruments were removed from the eye and 9-0 nylon was used to close the sclerotomy sites.  The conjunctiva was closed with wetfield cautery.  Polymyxin and gentamicin were irrigated into [place] space.  Atropine solution was applied.  Marcaine was injected around the globe for postoperative pain.  Polysporin, a patch and shield were placed.  Closing tension was 10 with a Barraquer tonometer.  COMPLICATIONS:  None.  DURATION:  One hour.  The patient was awakened and taken to recovery in satisfactory condition with a patch and shield. DD:  07/30/00 TD:  07/30/00 Job: [id number]

## 2010-10-13 NOTE — H&P (Signed)
NAMEJOSALIN, [name]               ACCOUNT NO.:  [id number]   MEDICAL RECORD NO.:  [id number]           PATIENT TYPE:   LOCATION:                               FACILITY:  [facility]   PHYSICIAN:  Cecille Aver, M.D.DATE OF BIRTH:  Jan 31, 1971   DATE OF ADMISSION:  06/14/2005  DATE OF DISCHARGE:                                HISTORY & PHYSICAL   REASON FOR ADMISSION:  Pain and swelling bilateral lower extremities.   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  The symptoms initially started approximately two weeks ago and  at that time, she presented to Urgent Care and was diagnosed with  cellulitis.  She states that the right lower extremity initially presented  as a dark colored lesion on the anterior tibial surface.  It was painful to  touch and warm.  At the Urgent Care, she was prescribed Doxycycline for MRSA  prophylaxis which caused her nausea and vomiting.  She, therefore, followed  up with Dr. Lowell Guitar, who is her primary physician at [facility], on June 06, 2005, and, again, diagnosed with cellulitis and  given a prescription for Septra.  He asked that if she could continue the  Doxycycline, it would be better to continue the ten day course of that.  The  patient was able to tolerate the Doxycycline and completed a full course.  The patient presents to [facility] today because she describes  worsening pain and swelling in her lower extremities.  She states that the  swelling is worse now and bilaterally.  She states that the legs are very  tender to touch, she can barely place a sock on her feet, and it is painful  to bear weight.  The swelling does get worse throughout the day and is less  in  the mornings.  The patient is without fever or chills.  The patient  denies nausea and vomiting.  The patient has no history of heart disease.  She has no known thyroid disease.   PAST MEDICAL HISTORY:  1.  Chronic immunosuppression.  2.  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  3.  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  4.  History of superficial deep wound infections treated in the past with      vancomycin.  5.  History of diabetes mellitus, now in remission secondary to problem 1.  6.  Hypertension.  7.  History of gastroesophageal reflux disease.  8.  Hyperlipidemia.  9.  Iron deficiency anemia.  10. History of alopecia.  11. Obesity.  12. History of UTI in June 2006.  13. History of chronic low back pain.   ALLERGIES:  Flagyl causes swelling.   MEDICATIONS:  1.  Toprol 100 mg daily.  2.  Cellcept 500 mg 4 tablets b.i.d.  3.  Prograf 1 mg 3 tablets b.i.d.  4.  Aspirin one daily.  5.  Norvasc 10 mg daily.  6.  Protonix 40 mg daily.  7.  Ferrous sulfate 325 mg daily.   FAMILY HISTORY:  Noncontributory.   SOCIAL HISTORY:  The patient currently lives in [place].  Her mother is  currently on hemodialysis at [facility].  She is the mother of an 67-year-  old child.  She was working as a Administrator.  She does not smoke or  drink alcoholic beverages.   REVIEW OF SYMPTOMS:  Please see HPI.  The patient denies weight change.  The  patient denies fatigue.  The patient denies tinnitus or rhinorrhea.  The  patient denies chest pain, shortness of breath, cough.  The patient denies  abdominal pain, nausea and vomiting, change in bowel habits.  The patient is  urinating within normal limits without hematuria or dysuria or frequency.   PHYSICAL EXAMINATION:  VITAL SIGNS:  Blood pressure lying 142/100, blood pressure sitting 120/80,  blood pressure  standing 110/68, heart rate 76, temperature 98.3, weight  155.5 pounds.  GENERAL:  [name] is a very pleasant 40 year old African American female  who is alert and cooperative to questioning.  She is in no acute distress at  this time.  She is accompanied by a female friend.  HEENT:  Head is normocephalic, atraumatic.  Eyes, her pupils equal, round,  reactive. extraocular movements intact bilaterally.  iii  HEART:  Regular rate and rhythm, no murmurs, gallops, and rubs.  LUNGS:  Clear to auscultation bilaterally.  ABDOMEN:  Soft, active bowel sounds, nontender to palpation.  EXTREMITIES:  Moderate 3+ bilateral lower extremity, right greater than  left.  Extremely tender to palpation from the knee to the feet.  The patient  has a darkened induration right anterior tibial region which is tender and  non-fluctuant.  She also has a questionable left medial darkened induration  tender bilaterally.  Slight warmth to the bilateral lower extremities with  no erythema present.  There are no open lesions and there are no signs of  drainage.  Pulses are intact bilaterally.   ASSESSMENT AND PLAN:  As discussed with Dr. Casimiro Needle:  1.  Bilateral lower extremity cellulitis.  The patient is to be admitted to      a private room for bedrest and lower extremity elevation.  She is to      start IV vancomycin per pharmacy.  We are being cautious with this      patient given her current immunosuppression and her failure to respond      to p.o. antibiotics.  2.  Status post kidney pancreas transplant, this is currently stable, will      continue her routine medications.  3.  Hypertension currently under control on Toprol and Norvasc.  4.  Anemia, continue p.o. iron.      Azucena Fallen, PA    ______________________________  Cecille Aver, M.D.   MY/MEDQ  D:  06/14/2005  T:  06/14/2005  Job:  [id number]

## 2010-10-13 NOTE — Discharge Summary (Signed)
[name], [name]               ACCOUNT NO.:  [id number]   MEDICAL RECORD NO.:  [id number]          PATIENT TYPE:  INP   LOCATION:  5507                         FACILITY:  [facility]   PHYSICIAN:  Cecille Aver, M.D.DATE OF BIRTH:  [birth year]   DATE OF ADMISSION:  06/14/2005  DATE OF DISCHARGE:  06/15/2005                                 DISCHARGE SUMMARY   ADMITTING DIAGNOSES:  1.  Bilateral lower extremity cellulitis.  2.  Status post kidney and pancreas transplant.  3.  Hypertension.  4.  Anemia.  5.  Diabetes mellitus, in remission secondary to pancreatic transplant.   DISCHARGE DIAGNOSES:  1.  Lower extremity cellulitis, much improved with antibiotics.  2.  Status post kidney and pancreas transplant, stable function.  3.  Hypertension.  4.  Anemia.  5.  Diabetes mellitus, in remission secondary to pancreatic transplant.   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  This diabetes mellitus is now in  remission, and hypertension is fairly controlled.  The patient presents now  with pain and swelling of the bilateral lower extremities. The symptoms  initially started 2 weeks prior to admission. She presented to Urgent Care  Center and was diagnosed with cellulitis.  Was to prescribe doxycycline for  Methicillin-resistant Staphylococcus aureus prophylaxis, which caused her  nausea and vomiting. She then followed up with Dr. Casimiro Needle (her primary  physician) at [facility] on June 06, 2005.  Again, was  diagnosed with cellulitis and given a prescription for Septra. Dr. Lowell Guitar  also recommended she continue her doxycycline. She completed a full 10-day  course of doxycycline, but presents to the office today complaining of  worsening symptoms in her legs of pain and swelling. The pain is worsened  with  weightbearing. She denies fever, chills, nausea and vomiting.   LABS ON ADMISSION:  White count 10,000, hemoglobin 12.6. Sodium 138,  potassium 4.0, chloride 110, CO2 21, glucose 103, BUN 14, creatinine 1.0,  calcium 9.1, albumin 3.9. LFTs within normal limits.   HOSPITAL COURSE:  Blood cultures were obtained and these remained negative.  Lower extremity Dopplers showed no evidence of DVT, Baker cyst or  superficial thrombosis. IV antibiotics were used, including vancomycin. She  remained afebrile in the hospital. With elevation and IV antibiotics, her  symptoms improved significantly. The patient felt much better and was  requesting discharge after 48 hours.   She was discharged home, to complete a 7-day course of Augmentin 500 mg  b.i.d.  Her creatinine and BUN remained stable. She is to follow-up with Dr.  Lowell Guitar as needed. She was instructed to keep her legs elevated as much as  possible.   DISCHARGE MEDICATIONS:  1.  Augmentin 500 mg b.i.d. for 7 more days.  2.  CellCept 1000 mg b.i.d.  3.  Prograf 4 mg b.i.d.  4.  Norvasc 10 mg daily.  5.  Toprol XL 100 mg daily.  6.  Aspirin 81 mg daily.  7.  Ferrous sulfate  325 mg b.i.d.      [name], P.A.    ______________________________  Cecille Aver, M.D.    RRK/MEDQ  D:  07/20/2005  T:  07/21/2005  Job:  [id number]

## 2010-10-13 NOTE — Discharge Summary (Signed)
NAMEMCKENZEE, BEEM               ACCOUNT NO.:  [id number]   MEDICAL RECORD NO.:  [id number]          PATIENT TYPE:  INP   LOCATION:  6703                         FACILITY:  [facility]   PHYSICIAN:  Aram Beecham B. Eliott Nine, M.D.DATE OF BIRTH:  30-Sep-1970   DATE OF ADMISSION:  07/16/2008  DATE OF DISCHARGE:  07/23/2008                               DISCHARGE SUMMARY   The patient was admitted to Dr. Delton Coombes but then the service was  transferred to Dr. Camille Bal.   ADMITTING DIAGNOSES:  1. Hypoxemia with respiratory failure secondary to bilateral      infiltrates versus pulmonary edema .  2. End-stage renal disease on chronic hemodialysis. .  3. 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  4. Hypertension.  5. Diabetes mellitus.  6. Medical nonadherence.  7. Pancreas transplant with failed renal transplant   DISCHARGE DIAGNOSES:  1. Ventilator-dependent respiratory failure secondary to pulmonary      edema - resolved.  2. Left-sided pneumonia status post  antibiotics.  3. End-stage renal disease on chronic hemodialysis  4. Diabetes mellitus.  5. Hypertension.  6. 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  7. Pancreas transplant (functioning) with h/o failed renal transplant   BRIEF HISTORY:  A 40 year old black female with history of chronic  kidney disease with history of end-stage renal disease secondary to  diabetes and hypertension status post renal and pancreas transplant, now  back on chronic hemodialysis, due to failed renal transplant secondary  to medical nonadherence.  She dialyzes every Tuesday, Thursday, and  Saturday at the [facility].  She frequently  misses dialysis or signs off early prior to completion of her full  treatment.  She presents to the emergency room complaining of coughing,  fevers, general fatigue, dyspnea on exertion, overall weakness.  Her  last dialysis was on July 13, 2008.  In the ER, pulmonary edema was  suspected.  She was taken to dialysis, approximately 3 in the morning on  July 17, 2008.  With 3-1/2 hours of dialysis, 3.6 liters of  ultrafiltrate were removed.  However, the patient decompensated with  hypoxemia, confusion, and becoming very agitated.  Dialysis was  terminated 20 minutes early and she was returned to ICU.  She ultimately  required intubation and mechanical ventilation.  Also upon admission,  she was pancultured and empirically placed on vancomycin, Zosyn,  azithromycin, and fluconazole empirically.  She had received a dose of  azithromycin and Rocephin in the emergency room upon presentation.  The  patient dialyzed again on July 18, 2008, with another approximately  2.9 liters of ultrafiltrate removed.  She received 2 units of packed red  blood cells for a hemoglobin of 7.9.  Overall with aggressive fluid  removal with dialysis and empiric antibiotics and mechanical  ventilation, the patient was stabilizing and actually improving.   CT of the head done showed no acute abnormalities, only signs of chronic  sinusitis.  Serial chest x-rays were being done, which showed slow but  gradual improvement of the airspace disease pattern.  It became more  apparent that the etiology of her respiratory failure with pulmonary  edema, although possible infiltrate on the left lung.  Lowest obtained  post dialysis weight was 62.4 kg.  At this weight, systolic blood  pressures were as low as 85 during the course of most of the dialysis.  In that particular dialysis only 500 mL of ultrafiltrate were removed  due to persistent hypotension.  Ultimately, she was extubated on  July 20, 2008, successfully.  She was moved up to the kidney floor.  Antibiotics were tapered with all cultures remaining negative including  sputum, which was normal flora.  She received a total of 8-day course of  Zosyn.  She remained afebrile.  White blood cell count returned to  normal limits at the  time of discharge.  Blood pressure was controlled.  Lungs were clear by auscultation, and final chest x-ray showed no active  disease.  She was discharged home on no new antibiotics.  Taking into  consideration the persistent hypotension on dialysis and a post weight  of 62.4 kg and taking into consideration clothing and shoes, her new dry  weight was established at 65 kg, which is lower than her outpatient dry  weight had been previously.  She was strongly encouraged to be compliant  with 3 times a week dialysis and staying on her full treatment.   DISCHARGE MEDICATIONS:  1. PhosLo 667 mg 3 with meals.  2. CellCept 1000 mg b.i.d.  3. Prograf 3 mg daily.  4. Norvasc 10 mg at bedtime.  5. Glipizide 5 mg q.a.m.  6. Actos 45 mg q.a.m.  7. Dialyvite 1 daily.  8. Ambien ER 12.5 mg at bedtime.  9. Aspirin 81 mg daily.  10.Prednisone 5 mg q.a.m.  11.Protonix 40 mg daily.  12.Klonopin 0.5 mg b.i.d.  13.Hectorol 2.5 mcg IV each dialysis.   NEW DRY WEIGHT:  65 kg.      [name], P.A.      Duke Salvia Eliott Nine, M.D.  Electronically Signed    RRK/MEDQ  D:  08/21/2008  T:  08/22/2008  Job:  [id number]

## 2010-11-10 ENCOUNTER — Inpatient Hospital Stay (INDEPENDENT_AMBULATORY_CARE_PROVIDER_SITE_OTHER)
Admission: RE | Admit: 2010-11-10 | Discharge: 2010-11-10 | Disposition: A | Discharge status: Home / Self Care | Payer: Medicare Other | Source: Ambulatory Visit | Attending: Family Medicine | Admitting: Family Medicine

## 2010-11-10 DIAGNOSIS — G43009 Migraine without aura, not intractable, without status migrainosus: Secondary | ICD-10-CM

## 2010-11-13 ENCOUNTER — Emergency Department (HOSPITAL_COMMUNITY): Payer: Medicare Other

## 2010-11-13 ENCOUNTER — Encounter (HOSPITAL_COMMUNITY): Payer: Self-pay | Admitting: Radiology

## 2010-11-13 ENCOUNTER — Observation Stay (HOSPITAL_COMMUNITY)
Admission: EM | Admit: 2010-11-13 | Discharge: 2010-11-13 | Disposition: A | Discharge status: Home / Self Care | Payer: Medicare Other | Attending: Emergency Medicine | Admitting: Emergency Medicine

## 2010-11-13 DIAGNOSIS — Z79899 Other long term (current) drug therapy: Secondary | ICD-10-CM | POA: Insufficient documentation

## 2010-11-13 DIAGNOSIS — R51 Headache: Secondary | ICD-10-CM | POA: Insufficient documentation

## 2010-11-13 DIAGNOSIS — K219 Gastro-esophageal reflux disease without esophagitis: Secondary | ICD-10-CM | POA: Insufficient documentation

## 2010-11-13 DIAGNOSIS — I120 Hypertensive chronic kidney disease with stage 5 chronic kidney disease or end stage renal disease: Secondary | ICD-10-CM | POA: Insufficient documentation

## 2010-11-13 DIAGNOSIS — J069 Acute upper respiratory infection, unspecified: Principal | ICD-10-CM | POA: Insufficient documentation

## 2010-11-13 DIAGNOSIS — Z992 Dependence on renal dialysis: Secondary | ICD-10-CM | POA: Insufficient documentation

## 2010-11-13 DIAGNOSIS — E119 Type 2 diabetes mellitus without complications: Secondary | ICD-10-CM | POA: Insufficient documentation

## 2010-11-13 DIAGNOSIS — IMO0002 Reserved for concepts with insufficient information to code with codable children: Secondary | ICD-10-CM | POA: Insufficient documentation

## 2010-11-13 DIAGNOSIS — N186 End stage renal disease: Secondary | ICD-10-CM | POA: Insufficient documentation

## 2010-11-13 DIAGNOSIS — Z7982 Long term (current) use of aspirin: Secondary | ICD-10-CM | POA: Insufficient documentation

## 2010-11-13 LAB — POCT I-STAT, CHEM 8
BUN: 65 mg/dL — ABNORMAL HIGH (ref 6–23)
Calcium, Ion: 0.94 mmol/L — ABNORMAL LOW (ref 1.12–1.32)
Calcium, Ion: 0.8 mmol/L — ABNORMAL LOW (ref 1.12–1.32)
Chloride: 99 meq/L (ref 96–112)
Chloride: 106 mEq/L (ref 96–112)
Creatinine, Ser: 10.4 mg/dL — ABNORMAL HIGH (ref 0.50–1.10)
Glucose, Bld: 467 mg/dL — ABNORMAL HIGH (ref 70–99)
HCT: 39 % (ref 36.0–46.0)
HCT: 39 % (ref 36.0–46.0)
Hemoglobin: 13.3 g/dL (ref 12.0–15.0)
Potassium: 4.6 meq/L (ref 3.5–5.1)
Sodium: 128 meq/L — ABNORMAL LOW (ref 135–145)
TCO2: 22 mmol/L (ref 0–100)
TCO2: 23 mmol/L (ref 0–100)

## 2010-11-13 LAB — GLUCOSE, CAPILLARY: Glucose-Capillary: 294 mg/dL — ABNORMAL HIGH (ref 70–99)

## 2010-11-14 ENCOUNTER — Emergency Department (HOSPITAL_COMMUNITY): Payer: Medicare Other

## 2010-11-14 ENCOUNTER — Emergency Department (HOSPITAL_COMMUNITY)
Admission: EM | Admit: 2010-11-14 | Discharge: 2010-11-15 | Disposition: A | Discharge status: Home / Self Care | Payer: Medicare Other | Attending: Emergency Medicine | Admitting: Emergency Medicine

## 2010-11-14 DIAGNOSIS — R05 Cough: Secondary | ICD-10-CM | POA: Insufficient documentation

## 2010-11-14 DIAGNOSIS — E119 Type 2 diabetes mellitus without complications: Secondary | ICD-10-CM | POA: Insufficient documentation

## 2010-11-14 DIAGNOSIS — I10 Essential (primary) hypertension: Secondary | ICD-10-CM | POA: Insufficient documentation

## 2010-11-14 DIAGNOSIS — R059 Cough, unspecified: Secondary | ICD-10-CM | POA: Insufficient documentation

## 2010-11-14 DIAGNOSIS — Z79899 Other long term (current) drug therapy: Secondary | ICD-10-CM | POA: Insufficient documentation

## 2010-11-14 DIAGNOSIS — J3489 Other specified disorders of nose and nasal sinuses: Secondary | ICD-10-CM | POA: Insufficient documentation

## 2010-11-14 DIAGNOSIS — R51 Headache: Secondary | ICD-10-CM | POA: Insufficient documentation

## 2010-11-14 DIAGNOSIS — K219 Gastro-esophageal reflux disease without esophagitis: Secondary | ICD-10-CM | POA: Insufficient documentation

## 2010-11-14 DIAGNOSIS — R0602 Shortness of breath: Secondary | ICD-10-CM | POA: Insufficient documentation

## 2010-11-21 ENCOUNTER — Emergency Department (HOSPITAL_COMMUNITY)
Admission: EM | Admit: 2010-11-21 | Discharge: 2010-11-21 | Disposition: A | Discharge status: Home / Self Care | Payer: Medicare Other | Attending: Emergency Medicine | Admitting: Emergency Medicine

## 2010-11-21 ENCOUNTER — Inpatient Hospital Stay (HOSPITAL_COMMUNITY)
Admission: EM | Admit: 2010-11-21 | Discharge: 2010-11-24 | DRG: 073 | Disposition: A | Discharge status: Home / Self Care | Payer: Medicare Other | Attending: Internal Medicine | Admitting: Internal Medicine

## 2010-11-21 ENCOUNTER — Emergency Department (HOSPITAL_COMMUNITY): Payer: Medicare Other

## 2010-11-21 DIAGNOSIS — R0602 Shortness of breath: Secondary | ICD-10-CM | POA: Insufficient documentation

## 2010-11-21 DIAGNOSIS — R7881 Bacteremia: Secondary | ICD-10-CM | POA: Diagnosis present

## 2010-11-21 DIAGNOSIS — R059 Cough, unspecified: Secondary | ICD-10-CM | POA: Insufficient documentation

## 2010-11-21 DIAGNOSIS — E109 Type 1 diabetes mellitus without complications: Secondary | ICD-10-CM | POA: Diagnosis present

## 2010-11-21 DIAGNOSIS — E871 Hypo-osmolality and hyponatremia: Secondary | ICD-10-CM | POA: Diagnosis present

## 2010-11-21 DIAGNOSIS — J40 Bronchitis, not specified as acute or chronic: Secondary | ICD-10-CM | POA: Insufficient documentation

## 2010-11-21 DIAGNOSIS — E119 Type 2 diabetes mellitus without complications: Secondary | ICD-10-CM | POA: Insufficient documentation

## 2010-11-21 DIAGNOSIS — Z79899 Other long term (current) drug therapy: Secondary | ICD-10-CM | POA: Insufficient documentation

## 2010-11-21 DIAGNOSIS — G518 Other disorders of facial nerve: Principal | ICD-10-CM | POA: Diagnosis present

## 2010-11-21 DIAGNOSIS — Z940 Kidney transplant status: Secondary | ICD-10-CM

## 2010-11-21 DIAGNOSIS — R05 Cough: Secondary | ICD-10-CM | POA: Insufficient documentation

## 2010-11-21 DIAGNOSIS — D638 Anemia in other chronic diseases classified elsewhere: Secondary | ICD-10-CM | POA: Diagnosis present

## 2010-11-21 DIAGNOSIS — Z992 Dependence on renal dialysis: Secondary | ICD-10-CM

## 2010-11-21 DIAGNOSIS — Z794 Long term (current) use of insulin: Secondary | ICD-10-CM

## 2010-11-21 DIAGNOSIS — Z7902 Long term (current) use of antithrombotics/antiplatelets: Secondary | ICD-10-CM

## 2010-11-21 DIAGNOSIS — I120 Hypertensive chronic kidney disease with stage 5 chronic kidney disease or end stage renal disease: Secondary | ICD-10-CM | POA: Diagnosis present

## 2010-11-21 DIAGNOSIS — N186 End stage renal disease: Secondary | ICD-10-CM | POA: Diagnosis present

## 2010-11-21 DIAGNOSIS — T486X5A Adverse effect of antiasthmatics, initial encounter: Secondary | ICD-10-CM | POA: Diagnosis present

## 2010-11-21 DIAGNOSIS — Z7982 Long term (current) use of aspirin: Secondary | ICD-10-CM

## 2010-11-21 DIAGNOSIS — Z23 Encounter for immunization: Secondary | ICD-10-CM

## 2010-11-21 DIAGNOSIS — I10 Essential (primary) hypertension: Secondary | ICD-10-CM | POA: Insufficient documentation

## 2010-11-21 DIAGNOSIS — K219 Gastro-esophageal reflux disease without esophagitis: Secondary | ICD-10-CM | POA: Insufficient documentation

## 2010-11-21 DIAGNOSIS — Z9483 Pancreas transplant status: Secondary | ICD-10-CM

## 2010-11-21 LAB — COMPREHENSIVE METABOLIC PANEL
AST: 17 U/L (ref 0–37)
BUN: 82 mg/dL — ABNORMAL HIGH (ref 6–23)
CO2: 20 mEq/L (ref 19–32)
Calcium: 8.4 mg/dL (ref 8.4–10.5)
Creatinine, Ser: 13.21 mg/dL — ABNORMAL HIGH (ref 0.50–1.10)
GFR calc Af Amer: 4 mL/min — ABNORMAL LOW (ref >60)
GFR calc non Af Amer: 3 mL/min — ABNORMAL LOW (ref >60)
Glucose, Bld: 813 mg/dL (ref 70–99)
Total Bilirubin: 0.3 mg/dL (ref 0.3–1.2)

## 2010-11-21 LAB — POCT I-STAT 3, VENOUS BLOOD GAS (G3P V)
Bicarbonate: 23.4 mEq/L (ref 20.0–24.0)
TCO2: 25 mmol/L (ref 0–100)
pCO2, Ven: 41.9 mmHg — ABNORMAL LOW (ref 45.0–50.0)
pH, Ven: 7.355 — ABNORMAL HIGH (ref 7.250–7.300)

## 2010-11-21 LAB — BASIC METABOLIC PANEL
CO2: 23 mEq/L (ref 19–32)
Chloride: 84 mEq/L — ABNORMAL LOW (ref 96–112)
Potassium: 5.6 mEq/L — ABNORMAL HIGH (ref 3.5–5.1)
Sodium: 122 mEq/L — ABNORMAL LOW (ref 135–145)

## 2010-11-21 LAB — POCT I-STAT, CHEM 8
BUN: 90 mg/dL — ABNORMAL HIGH (ref 6–23)
Creatinine, Ser: 16.6 mg/dL — ABNORMAL HIGH (ref 0.50–1.10)
Hemoglobin: 12.9 g/dL (ref 12.0–15.0)
Potassium: 5.2 mEq/L — ABNORMAL HIGH (ref 3.5–5.1)
Sodium: 119 mEq/L — CL (ref 135–145)

## 2010-11-21 LAB — CBC
HCT: 31.9 % — ABNORMAL LOW (ref 36.0–46.0)
MCH: 31.5 pg (ref 26.0–34.0)
MCV: 93.8 fL (ref 78.0–100.0)
Platelets: 268 10*3/uL (ref 150–400)
Platelets: 267 10*3/uL (ref 150–400)
RBC: 3.45 MIL/uL — ABNORMAL LOW (ref 3.87–5.11)
RBC: 3.4 MIL/uL — ABNORMAL LOW (ref 3.87–5.11)
WBC: 9.6 10*3/uL (ref 4.0–10.5)

## 2010-11-21 LAB — DIFFERENTIAL
Basophils Absolute: 0 10*3/uL (ref 0.0–0.1)
Basophils Relative: 0 % (ref 0–1)
Eosinophils Absolute: 0.2 10*3/uL (ref 0.0–0.7)
Eosinophils Absolute: 0.2 10*3/uL (ref 0.0–0.7)
Lymphocytes Relative: 21 % (ref 12–46)
Lymphs Abs: 1.6 10*3/uL (ref 0.7–4.0)
Lymphs Abs: 2 10*3/uL (ref 0.7–4.0)
Monocytes Relative: 7 % (ref 3–12)
Neutrophils Relative %: 77 % (ref 43–77)
Neutrophils Relative %: 69 % (ref 43–77)

## 2010-11-22 ENCOUNTER — Inpatient Hospital Stay (HOSPITAL_COMMUNITY): Payer: Medicare Other

## 2010-11-22 LAB — BASIC METABOLIC PANEL
BUN: 82 mg/dL — ABNORMAL HIGH (ref 6–23)
CO2: 22 mEq/L (ref 19–32)
Calcium: 8.8 mg/dL (ref 8.4–10.5)
Chloride: 92 mEq/L — ABNORMAL LOW (ref 96–112)
Creatinine, Ser: 14.07 mg/dL — ABNORMAL HIGH (ref 0.50–1.10)
Glucose, Bld: 60 mg/dL — ABNORMAL LOW (ref 70–99)

## 2010-11-22 LAB — HEMOGLOBIN A1C
Hgb A1c MFr Bld: 12.8 % — ABNORMAL HIGH
Mean Plasma Glucose: 321 mg/dL — ABNORMAL HIGH

## 2010-11-22 LAB — CBC
HCT: 31 % — ABNORMAL LOW (ref 36.0–46.0)
Hemoglobin: 10.8 g/dL — ABNORMAL LOW (ref 12.0–15.0)
MCH: 31.6 pg (ref 26.0–34.0)
MCHC: 34.8 g/dL (ref 30.0–36.0)
MCV: 90.6 fL (ref 78.0–100.0)
Platelets: 256 10*3/uL (ref 150–400)
RBC: 3.42 MIL/uL — ABNORMAL LOW (ref 3.87–5.11)
RDW: 14 % (ref 11.5–15.5)
WBC: 12.3 10*3/uL — ABNORMAL HIGH (ref 4.0–10.5)

## 2010-11-22 LAB — GLUCOSE, CAPILLARY
Glucose-Capillary: 224 mg/dL — ABNORMAL HIGH (ref 70–99)
Glucose-Capillary: 259 mg/dL — ABNORMAL HIGH (ref 70–99)
Glucose-Capillary: 382 mg/dL — ABNORMAL HIGH (ref 70–99)
Glucose-Capillary: 562 mg/dL (ref 70–99)
Glucose-Capillary: 84 mg/dL (ref 70–99)
Glucose-Capillary: >600 mg/dL (ref 70–99)

## 2010-11-22 LAB — MRSA PCR SCREENING: MRSA by PCR: NEGATIVE

## 2010-11-23 ENCOUNTER — Inpatient Hospital Stay (HOSPITAL_COMMUNITY): Payer: Medicare Other

## 2010-11-23 LAB — GLUCOSE, CAPILLARY
Glucose-Capillary: 125 mg/dL — ABNORMAL HIGH (ref 70–99)
Glucose-Capillary: 90 mg/dL (ref 70–99)
Glucose-Capillary: 140 mg/dL — ABNORMAL HIGH (ref 70–99)
Glucose-Capillary: 570 mg/dL (ref 70–99)
Glucose-Capillary: 357 mg/dL — ABNORMAL HIGH (ref 70–99)

## 2010-11-23 LAB — RENAL FUNCTION PANEL
CO2: 32 mEq/L (ref 19–32)
Calcium: 7.9 mg/dL — ABNORMAL LOW (ref 8.4–10.5)
Chloride: 98 mEq/L (ref 96–112)
GFR calc Af Amer: 7 mL/min — ABNORMAL LOW (ref >60)
GFR calc non Af Amer: 6 mL/min — ABNORMAL LOW (ref >60)
Potassium: 4.2 mEq/L (ref 3.5–5.1)
Sodium: 139 mEq/L (ref 135–145)

## 2010-11-23 LAB — CBC
MCH: 31.6 pg (ref 26.0–34.0)
Platelets: 239 10*3/uL (ref 150–400)
RBC: 3.74 MIL/uL — ABNORMAL LOW (ref 3.87–5.11)
WBC: 7.9 10*3/uL (ref 4.0–10.5)

## 2010-11-24 LAB — GLUCOSE, CAPILLARY

## 2010-11-30 NOTE — Discharge Summary (Signed)
[name], [name] NO.:  [id number]  MEDICAL RECORD NO.:  [id number]  LOCATION:  4739                         FACILITY:  [facility]  PHYSICIAN:  Clydia Llano, MD       DATE OF BIRTH:  August 05, 1970  DATE OF ADMISSION:  11/21/2010 DATE OF DISCHARGE:  11/24/2010                              DISCHARGE SUMMARY   PRIMARY CARE PHYSICIAN:  Robyn N. Allyne Gee, MD.  NEPHROLOGIST:  Duke Salvia. Eliott Nine, MD.  REASON FOR ADMISSION:  Twitching of the face.  DISCHARGE DIAGNOSES: 1. Face twitching, likely to be secondary to medication reaction. 2. Brittle type 1 diabetes mellitus. 3. End-stage renal disease, on hemodialysis. 4. Status post renal and pancreatic transplant. 5. Anemia of chronic disease. 6. Hypertension. 7. Secondary hyperparathyroidism.  MEDICATIONS: 1. NovoLog insulin 3-20 units subcutaneously 3 times daily with meals. 2. Ambien 10 mg p.o. daily at bedtime. 3. Aspirin enteric coated 81 mg p.o. daily. 4. CellCept 250 mg p.o. b.i.d. 5. Clonazepam 1 mg 2 tablets daily at bedtime. 6. Lantus 5 units subcu daily at bedtime. 7. Metoprolol succinate 100 mg p.o. daily at bedtime. 8. Norvasc 10 mg p.o. nightly. 9. Phenergan 25 mg b.i.d. 10.PhosLo 667 mg 3-4 tablets 3 times a day with meals. 11.Prednisone 5 mg p.o. daily. 12.Prograf 1 mg p.o. b.i.d. 13.Protonix 40 mg p.o. daily. 14.Renal vitamin 1 tablet p.o. nightly.  RADIOLOGY:  Chest x-ray on November 21, 2010, showed increased bronchitic changes.  BRIEF HISTORY EXAMINATION:  [name] is a 40 year old African American female with end-stage renal disease, on hemodialysis.  The patient came into the hospital on the day of admission with upper respiratory tract symptoms, was treated with albuterol and discharged home.  However, the patient subsequently developed severe twitching and involuntary movement of her face making her impossible to speak or swallow.  The patient was advised to come back to the emergency  department, and over there, she received Cogentin, Ativan, and Phenergan with partial relief.  With initial evaluation in the emergency department found the patient to have severe hyperglycemia with glucose of 823 and an anion gap of 19. Arterial blood gas with pH of 7.35.  The patient was given insulin step, was put on IV insulin, and subsequently admitted to the ICU for further evaluation.  For the upper respiratory symptoms, the patient was seen, she was sick and congested for about 4 weeks.  She does not have any fevers, chills, or night sweats or vomiting.  BRIEF HOSPITAL STAY: 1. Face twitching, likely myoclonus.  This is likely to be a reaction     to the albuterol she had.  The patient was admitted to the ICU.     Monitored overnight.  She was doing very fine.  No further     twitching. The albuterol was added to her allergy list because it     is the only new medication she got. 2. Hyperglycemia and brittle diabetes mellitus type 1.  The patient     has very brittle diabetes.  The patient is getting Lantus 5 units     and her fasting blood sugars usually within acceptable levels, but     during daytime, her  blood sugar goes up to 800 and she was not     acidotic, she was not in DKA.  The patient needs more adjustment of     her short-acting insulin, especially with meals, so her sliding     scale was switched to resistant insulin sliding scale. 3. End-stage renal disease.  The patient has renal transplant, I     assume it is not working.  The patient is still getting her     immunomodulators, but she is on hemodialysis.  The patient is     getting her dialysis days on Tuesday, Thursday, and Saturday. 4. Hypertension, is controlled. 5. Hyponatremia.  Upon admission, the patient has hyponatremia,     whereas sodium is 122.  This is unlikely to be pseudohyponatremia     secondary to hyperglycemia and blood glucose of 757.  This was     improved to 134 when the blood glucose went  down to regular levels.  DISCHARGE INSTRUCTIONS: 1. Diet, renal carbohydrate modified diet. 2. Activity, as tolerated. 3. Disposition to home.     Clydia Llano, MD     ME/MEDQ  D:  11/24/2010  T:  11/24/2010  Job:  [id number]  cc:   Candyce Churn. Allyne Gee, M.D.  Electronically Signed by Clydia Llano  on 11/30/2010 10:45:39 PM

## 2010-12-03 ENCOUNTER — Emergency Department (HOSPITAL_COMMUNITY)
Admission: EM | Admit: 2010-12-03 | Discharge: 2010-12-04 | Disposition: A | Discharge status: Home / Self Care | Payer: Medicare Other | Attending: Emergency Medicine | Admitting: Emergency Medicine

## 2010-12-03 DIAGNOSIS — R51 Headache: Secondary | ICD-10-CM | POA: Insufficient documentation

## 2010-12-03 DIAGNOSIS — Z9483 Pancreas transplant status: Secondary | ICD-10-CM | POA: Insufficient documentation

## 2010-12-03 DIAGNOSIS — Z79899 Other long term (current) drug therapy: Secondary | ICD-10-CM | POA: Insufficient documentation

## 2010-12-03 DIAGNOSIS — N186 End stage renal disease: Secondary | ICD-10-CM | POA: Insufficient documentation

## 2010-12-03 DIAGNOSIS — E119 Type 2 diabetes mellitus without complications: Secondary | ICD-10-CM | POA: Insufficient documentation

## 2010-12-03 DIAGNOSIS — I120 Hypertensive chronic kidney disease with stage 5 chronic kidney disease or end stage renal disease: Secondary | ICD-10-CM | POA: Insufficient documentation

## 2010-12-03 DIAGNOSIS — K219 Gastro-esophageal reflux disease without esophagitis: Secondary | ICD-10-CM | POA: Insufficient documentation

## 2010-12-03 DIAGNOSIS — Z940 Kidney transplant status: Secondary | ICD-10-CM | POA: Insufficient documentation

## 2010-12-04 LAB — POCT I-STAT 3, VENOUS BLOOD GAS (G3P V)
O2 Saturation: 82 %
TCO2: 28 mmol/L (ref 0–100)
pCO2, Ven: 45.6 mmHg (ref 45.0–50.0)
pO2, Ven: 48 mmHg — ABNORMAL HIGH (ref 30.0–45.0)

## 2010-12-04 LAB — POCT I-STAT, CHEM 8
BUN: 39 mg/dL — ABNORMAL HIGH (ref 6–23)
Chloride: 90 mEq/L — ABNORMAL LOW (ref 96–112)
Creatinine, Ser: 10.6 mg/dL — ABNORMAL HIGH (ref 0.50–1.10)
Creatinine, Ser: 9.4 mg/dL — ABNORMAL HIGH (ref 0.50–1.10)
Glucose, Bld: >700 mg/dL (ref 70–99)
Hemoglobin: 12.2 g/dL (ref 12.0–15.0)
Potassium: 5.4 mEq/L — ABNORMAL HIGH (ref 3.5–5.1)
Sodium: 130 mEq/L — ABNORMAL LOW (ref 135–145)
TCO2: 22 mmol/L (ref 0–100)

## 2010-12-04 LAB — DIFFERENTIAL
Eosinophils Relative: 2 % (ref 0–5)
Lymphocytes Relative: 16 % (ref 12–46)
Lymphs Abs: 1.5 10*3/uL (ref 0.7–4.0)
Monocytes Absolute: 0.5 10*3/uL (ref 0.1–1.0)

## 2010-12-04 LAB — BASIC METABOLIC PANEL
BUN: 35 mg/dL — ABNORMAL HIGH (ref 6–23)
Calcium: 7.9 mg/dL — ABNORMAL LOW (ref 8.4–10.5)
GFR calc non Af Amer: 5 mL/min — ABNORMAL LOW (ref >60)
Glucose, Bld: 721 mg/dL (ref 70–99)
Potassium: 5.2 mEq/L — ABNORMAL HIGH (ref 3.5–5.1)

## 2010-12-04 LAB — CBC
HCT: 31.5 % — ABNORMAL LOW (ref 36.0–46.0)
MCHC: 33.3 g/dL (ref 30.0–36.0)
MCV: 94.9 fL (ref 78.0–100.0)
RDW: 14.2 % (ref 11.5–15.5)

## 2010-12-07 NOTE — H&P (Signed)
NAMETHAO, BAUZA NO.:  [id number]  MEDICAL RECORD NO.:  [id number]  LOCATION:  [facility]                         FACILITY:  [facility]  PHYSICIAN:  Jonny Ruiz, MD    DATE OF BIRTH:  20-Oct-1970  DATE OF ADMISSION:  11/21/2010 DATE OF DISCHARGE:                             HISTORY & PHYSICAL   CHIEF COMPLAINT:  Twitching of the face.  HISTORY OF PRESENT ILLNESS:  The patient is a 40 year old African American female with end-stage renal disease on hemodialysis who was in our emergency department earlier for upper respiratory symptoms and was treated with albuterol and discharged home; however, subsequently developed severe twitching and involuntary movements of her face making it for her impossible to speak or swallow.  The patient was advised to come back to emergency department where she received Cogentin, Ativan, and Phenergan with partial relief.  Initial evaluation in the emergency department found the patient with severe hyperglycemia with a glucose of 823.  She had an anion gap of 19.  Arterial blood gas with pH 7.35.  The patient was given an insulin stabilizer and was subsequently called for further evaluation and admission.  Upon assessment, the patient was complaining of only the twitching and the involuntary movement, but otherwise felt no other complaints or symptoms.  The patient's husband helped with her history since the patient was not able to give much of the history because of the twitching in her face.  Her husband tells me that she has been sick with cough and congestion for about 4 weeks.  She does not have fever, chills, night sweats, nausea, or vomiting.  She has not had chest pain or shortness of breath, but admits to a constant productive cough of white sputum for the last 4 weeks.  Denies hemoptysis.  No history of COPD or asthma.  Denies tobacco.  PAST MEDICAL HISTORY: 1. Significant for a recent hospitalization at our institution  on     September 20, 2010, for an acute cystitis and left flank pain. 2. Poorly-controlled type 1 diabetes with a prior hospitalization     maintaining blood sugars initially in the 900s and subsequently     came down to the 91 up to 227. 3. She is status post pancreatic and renal transplant at [facility].  She is currently on     hemodialysis on Tuesday, Thursday, and Saturday, and she missed her     dialysis yesterday because she spent most of the day in the     emergency department for her upper respiratory symptoms. 4. Anemia of chronic disease. 5. Hypertension. 6. Secondary hyperparathyroidism. 7. Pantoea agglomerans group B bacteremia for which she receives     appropriate antibiotic therapy with 2 weeks course of vancomycin     and ceftazidime.  CURRENT MEDICATIONS: 1. Tylenol 650 p.o. q.4 p.r.n. for pain. 2. Amlodipine 5 mg at bedtime. 3. Flexeril 5 mg 3 times a day. 4. Dilaudid 2 mg q.6 p.r.n. pain. 5. Insulin NovoLog 2 units subcu t.i.d. with meals. 6. Sliding scale insulin subcu t.i.d. with meals specialized scale. 7. Lantus 5 units subcu nightly. 8. Metoprolol 50 mg  at bedtime. 9. Ambien 10 mg p.o. at bedtime. 10.Aspirin 81 mg a day. 11.Calcium acetate 667 mg 2-3 caps p.o. b.i.d. with meals. 12.CellCept 1000 p.o. b.i.d. 13.Clonazepam 2 mg p.o. nightly. 14.Phenergan 25 mg p.o. b.i.d. p.r.n. for nausea and vomiting. 15.Plavix 75 mg p.o. daily. 16.Prograf 3 mg p.o. b.i.d. 17.Protonix 40 mg p.o. daily. 18.Renal vitamin 1 tablet p.o. daily.  ALLERGIES:  MORPHINE SULFATE causes delirium, TRAMADOL causes nausea and vomiting, CHLORHEXIDINE causes rash and hives, METRONIDAZOLE causes hives, VICODIN causes nausea and vomiting, PENICILLIN causes rash.  SOCIAL HISTORY:  The patient is married and lives with her husband.  He is the one responsible for medical decision making Sherre Lain, phone number [phone]).  The patient denies use  of tobacco, alcohol, or illicit drugs.  FAMILY HISTORY:  Significant for hypertension, diabetes, and coronary artery disease.  REVIEW OF SYSTEMS:  Unable to obtain due to the patient's severe and persistent twitching of her mouth.  PHYSICAL EXAMINATION:  VITAL SIGNS:  Blood pressure 161/85, pulse 90, respirations 16, temperature 98.9. GENERAL APPEARANCE:  The patient is a pleasant African American female who is sitting on the stretcher with a persistent involuntary twitching of her face with difficulty to speak.  She is in no distress and she tries to be as cooperative as possible. HEENT:  No edema.  Head is atraumatic, PERRLA.  There is periorbital muscle twitching.  Oral cavity unremarkable.  Perioral muscles with persistent twitching of the muscles and myoclonus. NECK:  Supple without lymphadenopathy.  Thyroid without enlargement or nodules.  No carotid bruits. HEART:  Regular S1 and S2 with a 2/6 systolic ejection murmur over the aortic area and precordium.  No gallops or rubs. LUNGS:  With bilateral rhonchi, no wheezes or rales. ABDOMEN:  Soft and nontender without organomegaly or masses palpable. EXTREMITIES:  She has a right upper extremity AV fistula working well. Lower extremities without clubbing, cyanosis, or edema. NEUROLOGIC:  The patient is awake, alert and oriented x3.  She has severe myoclonus of the perioral muscles as well as the left periorbital muscles.  There is also muscle contractions and fasciculations of the left external cleidomastoid muscle.  Upper and lower extremities with full strength and no sensory deficits.  Reflexes of patella 2+.  Gait was not assessed.  LABORATORY DATA:  Sodium 120, potassium 5.0, chloride 81, carbon dioxide 20, glucose 813, BUN 82, creatinine 13.2, calcium 8.4, total protein 6.9, albumin 3.5, AST 17, ALT 13, alk phos 140, bilirubin 0.3. calculating anion gap 19, pH 7.35, pCO2 of 41.9, pO2 of 103.0, bicarb 23.4, oxygen  saturation 98%.  WBC 9.4, hemoglobin 10.7, hematocrit 31.9, MCV 93.8, platelet count 267.  EKG with normal sinus rhythm without ST or T-wave abnormalities.  Rate 90 beats per minute.  QTC 465 per minute.  MEDICAL DECISION MAKING: 1. Myoclonus most likely precipitated by the exposure to a drug, in     this case albuterol, in the setting of end-stage renal disease with     a missed session of hemodialysis yesterday.  The patient has been     medicated with Ativan, Phenergan, and Cogentin with partial     response.  She at the present time is getting a dose of Dilaudid     and Phenergan per ED physician.  She will be scheduled for dialysis     tomorrow, hopefully her myoclonus with improve with this approach.     If persistent myoclonus, we will consult Neurology for     consideration of  any EEG. 2. Severe hyperglycemia in the setting of type 1 uncontrolled     diabetes.  We will continue her on insulin stabilizer.  We will     hold off on IV fluids in view of her end-stage renal disease with a     missed session of hemodialysis.  We will not attempt to lower her     potassium since she has severe hyperglycemia and is getting IV     insulin.  We will monitor her basic metabolic panel closely. 3. End-stage renal disease.  She is scheduled for dialysis tomorrow. 4. Severe hyponatremia with a sodium of 120.  We will repeat a BMP in     a.m. 5. Secondary hyperparathyroidism. 6. Anemia of chronic disease. 7. History of renal/pancreatic transplant.  The patient is to continue     on Prograf and CellCept at the doses she is currently taking at     home. 8. Hypertension.  Continue on amlodipine 5 mg a day and metoprolol 50     mg a day.          ______________________________ Jonny Ruiz, MD     GL/MEDQ  D:  11/22/2010  T:  11/22/2010  Job:  [id number]  Electronically Signed by Jonny Ruiz MD on 12/07/2010 04:48:06 PM

## 2010-12-19 ENCOUNTER — Inpatient Hospital Stay (HOSPITAL_COMMUNITY)
Admission: EM | Admit: 2010-12-19 | Discharge: 2010-12-23 | DRG: 917 | Disposition: A | Discharge status: Home / Self Care | Payer: Medicare Other | Attending: Family Medicine | Admitting: Family Medicine

## 2010-12-19 DIAGNOSIS — Z9483 Pancreas transplant status: Secondary | ICD-10-CM

## 2010-12-19 DIAGNOSIS — Z940 Kidney transplant status: Secondary | ICD-10-CM

## 2010-12-19 DIAGNOSIS — T38801A Poisoning by unspecified hormones and synthetic substitutes, accidental (unintentional), initial encounter: Secondary | ICD-10-CM | POA: Diagnosis present

## 2010-12-19 DIAGNOSIS — T383X1A Poisoning by insulin and oral hypoglycemic [antidiabetic] drugs, accidental (unintentional), initial encounter: Principal | ICD-10-CM | POA: Diagnosis present

## 2010-12-19 DIAGNOSIS — IMO0002 Reserved for concepts with insufficient information to code with codable children: Secondary | ICD-10-CM

## 2010-12-19 DIAGNOSIS — N186 End stage renal disease: Secondary | ICD-10-CM | POA: Diagnosis present

## 2010-12-19 DIAGNOSIS — Z794 Long term (current) use of insulin: Secondary | ICD-10-CM

## 2010-12-19 DIAGNOSIS — Z7982 Long term (current) use of aspirin: Secondary | ICD-10-CM

## 2010-12-19 DIAGNOSIS — D638 Anemia in other chronic diseases classified elsewhere: Secondary | ICD-10-CM | POA: Diagnosis present

## 2010-12-19 DIAGNOSIS — E1065 Type 1 diabetes mellitus with hyperglycemia: Secondary | ICD-10-CM | POA: Diagnosis present

## 2010-12-19 DIAGNOSIS — N2581 Secondary hyperparathyroidism of renal origin: Secondary | ICD-10-CM | POA: Diagnosis present

## 2010-12-19 DIAGNOSIS — G43909 Migraine, unspecified, not intractable, without status migrainosus: Secondary | ICD-10-CM | POA: Diagnosis present

## 2010-12-19 DIAGNOSIS — K5289 Other specified noninfective gastroenteritis and colitis: Secondary | ICD-10-CM | POA: Diagnosis present

## 2010-12-19 DIAGNOSIS — I120 Hypertensive chronic kidney disease with stage 5 chronic kidney disease or end stage renal disease: Secondary | ICD-10-CM | POA: Diagnosis present

## 2010-12-19 DIAGNOSIS — G4700 Insomnia, unspecified: Secondary | ICD-10-CM | POA: Diagnosis present

## 2010-12-19 LAB — BASIC METABOLIC PANEL
BUN: 22 mg/dL (ref 6–23)
CO2: 33 mEq/L — ABNORMAL HIGH (ref 19–32)
Chloride: 95 mEq/L — ABNORMAL LOW (ref 96–112)
Glucose, Bld: 58 mg/dL — ABNORMAL LOW (ref 70–99)
Potassium: 3.7 mEq/L (ref 3.5–5.1)

## 2010-12-19 LAB — GLUCOSE, CAPILLARY
Glucose-Capillary: 107 mg/dL — ABNORMAL HIGH (ref 70–99)
Glucose-Capillary: 179 mg/dL — ABNORMAL HIGH (ref 70–99)

## 2010-12-19 LAB — DIFFERENTIAL
Basophils Absolute: 0 10*3/uL (ref 0.0–0.1)
Lymphocytes Relative: 14 % (ref 12–46)
Monocytes Absolute: 0.7 10*3/uL (ref 0.1–1.0)
Neutro Abs: 10.1 10*3/uL — ABNORMAL HIGH (ref 1.7–7.7)
Neutrophils Relative %: 79 % — ABNORMAL HIGH (ref 43–77)

## 2010-12-19 LAB — CBC
HCT: 35.6 % — ABNORMAL LOW (ref 36.0–46.0)
Hemoglobin: 11.7 g/dL — ABNORMAL LOW (ref 12.0–15.0)
MCHC: 32.9 g/dL (ref 30.0–36.0)
RBC: 3.81 MIL/uL — ABNORMAL LOW (ref 3.87–5.11)
WBC: 12.7 10*3/uL — ABNORMAL HIGH (ref 4.0–10.5)

## 2010-12-20 LAB — DIFFERENTIAL
Basophils Relative: 0 % (ref 0–1)
Eosinophils Absolute: 0.2 10*3/uL (ref 0.0–0.7)
Eosinophils Relative: 2 % (ref 0–5)
Monocytes Absolute: 0.9 10*3/uL (ref 0.1–1.0)
Neutro Abs: 5.8 10*3/uL (ref 1.7–7.7)

## 2010-12-20 LAB — BASIC METABOLIC PANEL
BUN: 30 mg/dL — ABNORMAL HIGH (ref 6–23)
Chloride: 93 mEq/L — ABNORMAL LOW (ref 96–112)
Creatinine, Ser: 7.16 mg/dL — ABNORMAL HIGH (ref 0.50–1.10)
GFR calc Af Amer: 8 mL/min — ABNORMAL LOW (ref >60)
Glucose, Bld: 317 mg/dL — ABNORMAL HIGH (ref 70–99)
Potassium: 4 mEq/L (ref 3.5–5.1)

## 2010-12-20 LAB — GLUCOSE, CAPILLARY
Glucose-Capillary: 164 mg/dL — ABNORMAL HIGH (ref 70–99)
Glucose-Capillary: 199 mg/dL — ABNORMAL HIGH (ref 70–99)
Glucose-Capillary: 276 mg/dL — ABNORMAL HIGH (ref 70–99)
Glucose-Capillary: 283 mg/dL — ABNORMAL HIGH (ref 70–99)

## 2010-12-20 LAB — MRSA PCR SCREENING: MRSA by PCR: NEGATIVE

## 2010-12-20 LAB — CBC
HCT: 35 % — ABNORMAL LOW (ref 36.0–46.0)
Hemoglobin: 11.7 g/dL — ABNORMAL LOW (ref 12.0–15.0)
MCHC: 33.4 g/dL (ref 30.0–36.0)
MCV: 94.1 fL (ref 78.0–100.0)
RDW: 16 % — ABNORMAL HIGH (ref 11.5–15.5)

## 2010-12-21 ENCOUNTER — Inpatient Hospital Stay (HOSPITAL_COMMUNITY): Payer: Medicare Other

## 2010-12-21 LAB — CBC
HCT: 33.3 % — ABNORMAL LOW (ref 36.0–46.0)
MCHC: 32.7 g/dL (ref 30.0–36.0)
Platelets: 243 10*3/uL (ref 150–400)
RDW: 15.9 % — ABNORMAL HIGH (ref 11.5–15.5)
WBC: 7.7 10*3/uL (ref 4.0–10.5)

## 2010-12-21 LAB — GLUCOSE, CAPILLARY
Glucose-Capillary: 440 mg/dL — ABNORMAL HIGH (ref 70–99)
Glucose-Capillary: 331 mg/dL — ABNORMAL HIGH (ref 70–99)
Glucose-Capillary: 40 mg/dL — CL (ref 70–99)
Glucose-Capillary: 81 mg/dL (ref 70–99)
Glucose-Capillary: 322 mg/dL — ABNORMAL HIGH (ref 70–99)
Glucose-Capillary: 263 mg/dL — ABNORMAL HIGH (ref 70–99)

## 2010-12-21 LAB — RENAL FUNCTION PANEL
Albumin: 3 g/dL — ABNORMAL LOW (ref 3.5–5.2)
Chloride: 92 mEq/L — ABNORMAL LOW (ref 96–112)
GFR calc Af Amer: 6 mL/min — ABNORMAL LOW (ref >60)
GFR calc non Af Amer: 5 mL/min — ABNORMAL LOW (ref >60)
Phosphorus: 2.4 mg/dL (ref 2.3–4.6)
Potassium: 4.1 mEq/L (ref 3.5–5.1)
Sodium: 130 mEq/L — ABNORMAL LOW (ref 135–145)

## 2010-12-21 LAB — HEMOGLOBIN A1C
Hgb A1c MFr Bld: 11.9 % — ABNORMAL HIGH (ref <5.7)
Mean Plasma Glucose: 295 mg/dL — ABNORMAL HIGH (ref <117)

## 2010-12-22 LAB — CBC
HCT: 35.5 % — ABNORMAL LOW (ref 36.0–46.0)
MCHC: 32.7 g/dL (ref 30.0–36.0)
Platelets: 236 10*3/uL (ref 150–400)
RDW: 16 % — ABNORMAL HIGH (ref 11.5–15.5)

## 2010-12-22 LAB — BASIC METABOLIC PANEL
BUN: 22 mg/dL (ref 6–23)
GFR calc Af Amer: 9 mL/min — ABNORMAL LOW (ref >60)
GFR calc non Af Amer: 8 mL/min — ABNORMAL LOW (ref >60)
Potassium: 4 mEq/L (ref 3.5–5.1)
Sodium: 134 mEq/L — ABNORMAL LOW (ref 135–145)

## 2010-12-22 LAB — GLUCOSE, CAPILLARY
Glucose-Capillary: 119 mg/dL — ABNORMAL HIGH (ref 70–99)
Glucose-Capillary: 284 mg/dL — ABNORMAL HIGH (ref 70–99)
Glucose-Capillary: 216 mg/dL — ABNORMAL HIGH (ref 70–99)

## 2010-12-22 NOTE — Discharge Summary (Signed)
NAMECARL, [name] NO.:  [id number]  MEDICAL RECORD NO.:  [id number]  LOCATION:  5511                         FACILITY:  [facility]  PHYSICIAN:  Valetta Close, M.D.   DATE OF BIRTH:  03-18-1971  DATE OF ADMISSION:  12/19/2010 DATE OF DISCHARGE:  12/22/2010                              DISCHARGE SUMMARY   DISCHARGE DIAGNOSES: 1. Hypoglycemia secondary to inadvertent insulin overdose. 2. Headache secondary to hypoglycemia. 3. Mild gastroenteritis, resolved. 4. End-stage renal disease. 5. Anemia of chronic disease. 6. Status post pancreatic and renal transplant. 7. Hypertension, well controlled. 8. Insomnia, stable.  DISCHARGE MEDICATIONS:  Essentially unchanged and include, 1. Ambien 10 mg once at night. 2. Aspirin 81 mg once a day. 3. CellCept 500 mg twice a day. 4. Klonopin 1 mg 2 tablets at night. 5. Diphenhydramine 25 mg 2 tablets by mouth twice a day as needed for     itching. 6. Lantus 5 units subcu at night. 7. Toprol-XL 100 mg by mouth at night. 8. Norvasc 10 mg by mouth at night. 9. She takes Novolin sliding scale 3-20 units subcu with meals. 10.Phenergan 25 mg by mouth twice a day. 11.PhosLo 667 mg 3-4 tablets by mouth three times a day with meals. 12.Prednisone 5 mg once a day. 13.Prograf 3 capsules of 1 mg twice a day. 14.Protonix 40 mg once a day. 15.Renal vitamin 1 tablet once at night.  DISPOSITION AND FOLLOWUP:  She needs to follow up with Dr. Dorothyann Peng in 1 week.  Please note that although she was admitted for inadvertent hypoglycemia secondary to giving her 10 times as much of NovoLog she is supposed to get at dialysis, her hemoglobin A1c does remain elevated. It is at 11.9, but I do note that she is a brittle diabetic and her blood sugars remained quite low in the hospital for longer than they should of with that dose of NovoLog and even though she is hyperglycemic on the day of discharge, her sugars getting into the 300s.  She  says at home she still at times get lows, although she never gets highs above 300.  Therefore, I am hesitant to increase her Lantus, though certainly it would seem reasonable but as you know her better, I will defer to your judgment concerning her diabetic medications going forward.  All of her other issues are stable.  I will note that she continues to complain of multiple back spasms and abdominal spasms, but I will again defer that to you.  CONSULTATIONS:  Obtained from Nephrology.  BRIEF ADMISSION HISTORY AND PHYSICAL:  [name] is a 40 year old female with a history as previously stated who was at dialysis and was supposed to receive 5 units of NovoLog, but instead we see 50.  In our dictation, it said Lantus, but the patient is adamant that it was actually NovoLog that she was given too much.  This was on December 19, 2010.  Because of this, she had persistent hypoglycemia despite given 11 doses of D50 at the dialysis center and was referred to [facility] for admission while her medications were off.  She was initially admitted to the step-down unit where she  remained on December 20, 2010, was able to transfer out on December 21, 2010, where she was still having problems with hypoglycemia, but she was able to be maintained off the dextrose fluids.  Her sugars have remained a bit elevated, but I expect they will turn down back to normal on her home regimen or at least to what her baseline is, and we will have her follow up with Dr. Allyne Gee.  All other issues in the hospital were stable.  DISCHARGE LABS AND VITALS:  Temperature 98.3, heart rate 77, blood pressure 122/72, respiratory rate 18, O2 sat 99% on room air.  Sodium 134, potassium 4, glucose 315, chloride 95, bicarb 20, BUN 22, creatinine 6.1, calcium 8.7, A1c 11.9.  White count 7.5, hemoglobin 12.9, hematocrit 36, platelets 236.  Again discharge sugar was 304.  Approximate length of this discharge was approximately 30  minutes.     Valetta Close, M.D.     JC/MEDQ  D:  12/22/2010  T:  12/22/2010  Job:  [id number]  cc:   Candyce Churn. Allyne Gee, M.D. Duke Salvia Eliott Nine, M.D.  Electronically Signed by Valetta Close M.D. on 12/22/2010 02:03:25 PM

## 2010-12-23 ENCOUNTER — Inpatient Hospital Stay (HOSPITAL_COMMUNITY): Payer: Medicare Other

## 2010-12-23 ENCOUNTER — Inpatient Hospital Stay (INDEPENDENT_AMBULATORY_CARE_PROVIDER_SITE_OTHER)
Admission: RE | Admit: 2010-12-23 | Discharge: 2010-12-23 | Disposition: A | Discharge status: Home / Self Care | Payer: Medicare Other | Source: Ambulatory Visit | Attending: Family Medicine | Admitting: Family Medicine

## 2010-12-23 DIAGNOSIS — R51 Headache: Secondary | ICD-10-CM

## 2010-12-23 LAB — GLUCOSE, CAPILLARY

## 2010-12-26 LAB — CULTURE, BLOOD (ROUTINE X 2)
Culture  Setup Time: 201207250847
Culture: NO GROWTH

## 2010-12-29 NOTE — Discharge Summary (Signed)
  NAMEKAHLI, MAYON NO.:  [id number]  MEDICAL RECORD NO.:  [id number]  LOCATION:  5511                         FACILITY:  [facility]  PHYSICIAN:  Tarry Kos, MD       DATE OF BIRTH:  1971/03/13  DATE OF ADMISSION:  12/19/2010 DATE OF DISCHARGE:                              DISCHARGE SUMMARY   ADDENDUM  Ms. O'Neil's discharge was cancelled yesterday as she did not want to go home because her glucose was around 300.  Her hemoglobin A1c is over 11%.  She chronically is on Lantus 5 units subcu nightly and she was actually hospitalized for inadvertent overdose on insulin in the dialysis unit.  She received dialysis today, her sugars do around 250, which I suspect is chronically elevated.  I am going to decrease her Lantus from 5 units to 2 units nightly and have her have close outpatient follow up for better glycemic control.  She has been afebrile.  Vital signs have been stable.  GENERAL:  She is alert and oriented x4.  No apparent distress, cooperative and friendly:  HEART: Regular rate and rhythm.  No murmurs.  CHEST:  Clear to auscultation bilaterally, no wheezes, rales or crackles.  ABDOMEN: Soft, nontender, nondistended.  Positive bowel sounds.  No hepatosplenomegaly. EXTREMITIES:  No clubbing, cyanosis or edema.  PSYCH:  Normal affect. NEURO:  No focal neurologic deficits.  The patient will be discharged with an appropriate outpatient follow up with her primary care physician.          ______________________________ Tarry Kos, MD     RD/MEDQ  D:  12/23/2010  T:  12/23/2010  Job:  [id number]  Electronically Signed by Tarry Kos MD on 12/29/2010 12:08:52 PM

## 2011-01-01 IMAGING — CR DG CHEST 1V PORT
1 series · 1 of 1 positions shown · non-contrast
Comparison: 07/21/2008.

CLINICAL DATA: Catheter insertion.

PORTABLE CHEST - 1 VIEW

[view not recorded]
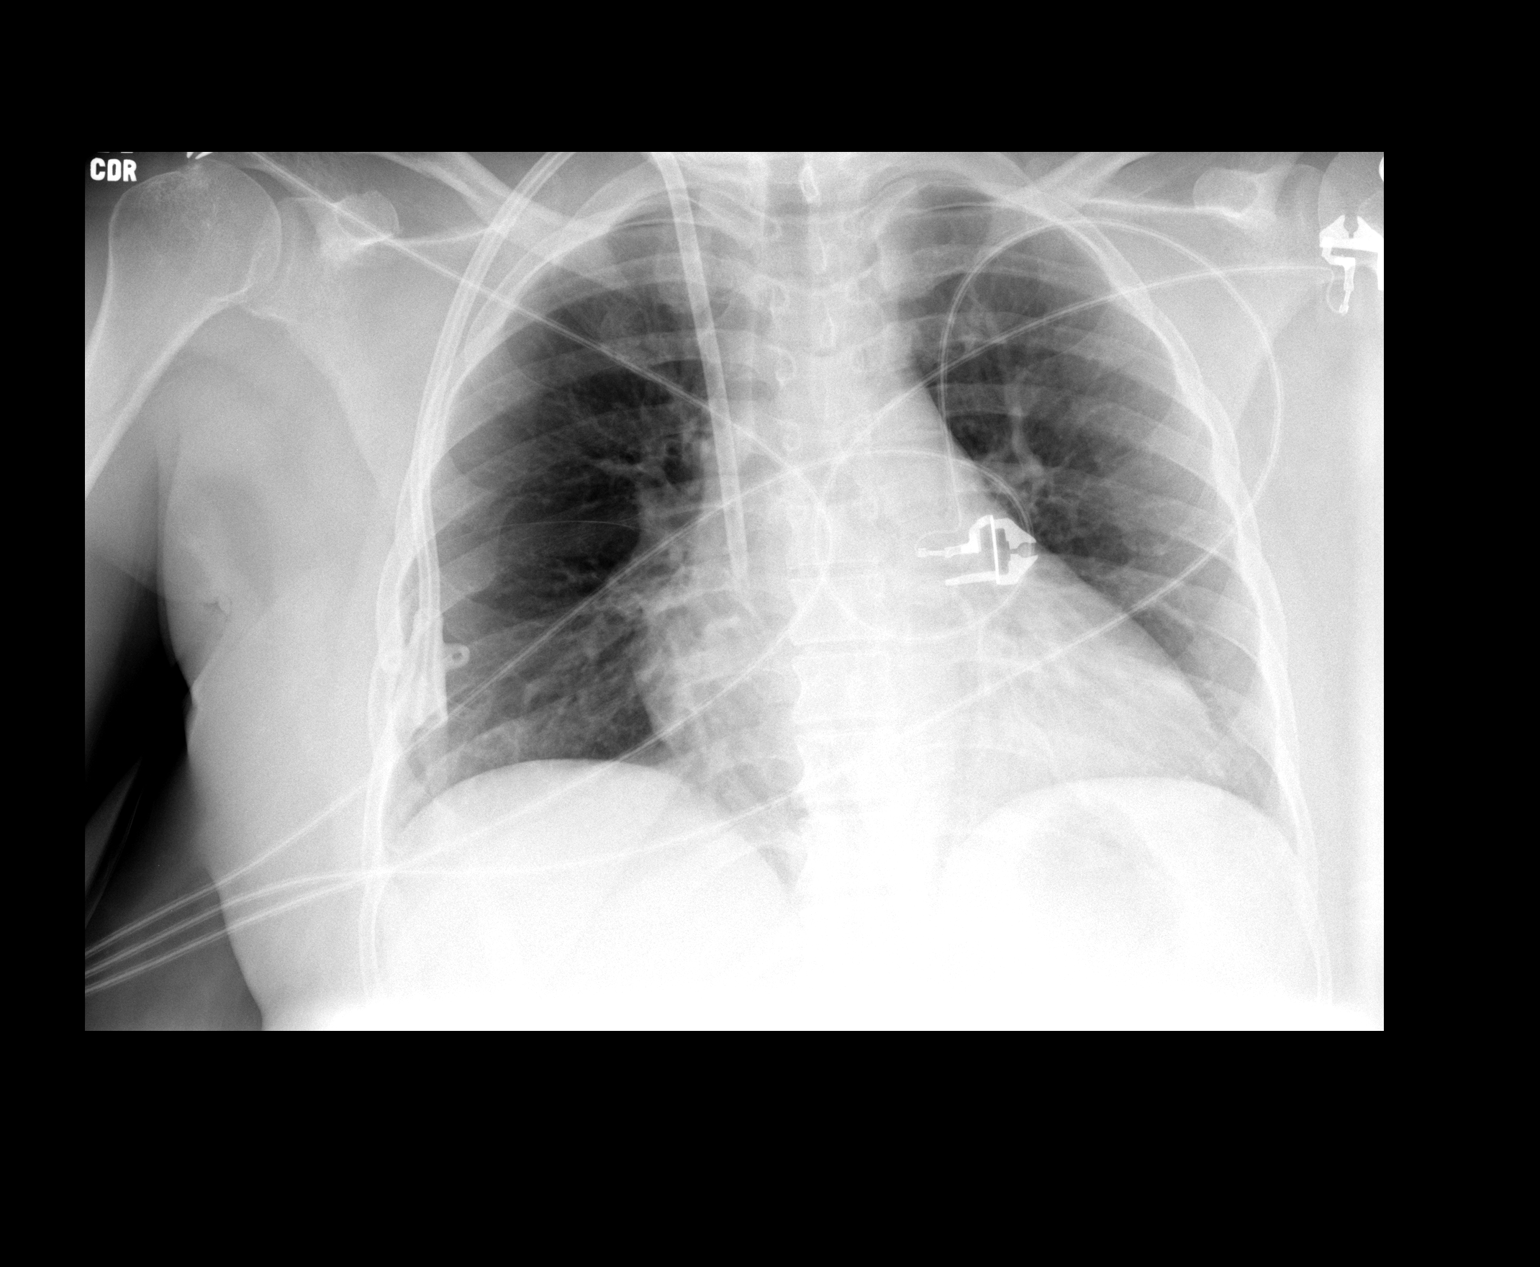

[1 of 1 positions shown; findings below may reference images not displayed]

FINDINGS: Right jugular catheter tip is at the cavoatrial junction
in good position.  There is no pneumothorax.  The lungs are clear
and  there is no edema or effusion.
IMPRESSION: Central venous catheter tip in the SVC at the cavoatrial junction.
No pneumothorax.

## 2011-01-08 IMAGING — CT CT PELVIS W/O CM
2 of 4 series · 17 of 46 positions shown, 19 images · non-contrast
Comparison: 07/17/2008

CT ABDOMEN

CLINICAL DATA: Diffuse abdominal pelvic pain, hemodialysis
dependent

CT ABDOMEN AND PELVIS WITHOUT CONTRAST
TECHNIQUE: Multidetector CT imaging of the abdomen and pelvis was
performed following the standard protocol without intravenous
contrast.

[Series 2: a/p wo/cm · axial · 0.75mm/px · z∈[-420,-30]mm · 14 of 86 slices shown, 16 images]
[im 4/86  soft-tissue]
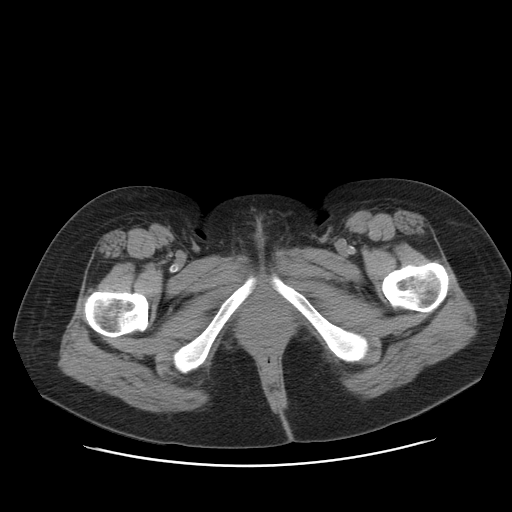
[im 4/86  bone]
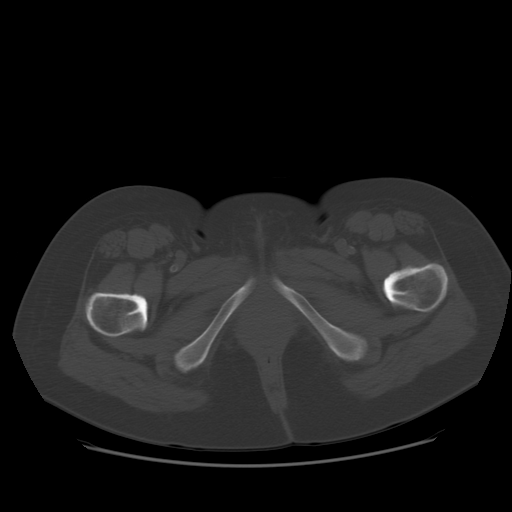
[im 11/86  soft-tissue]
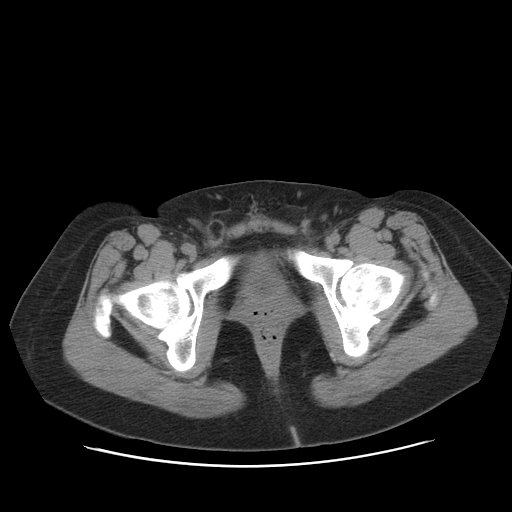
[im 18/86  soft-tissue]
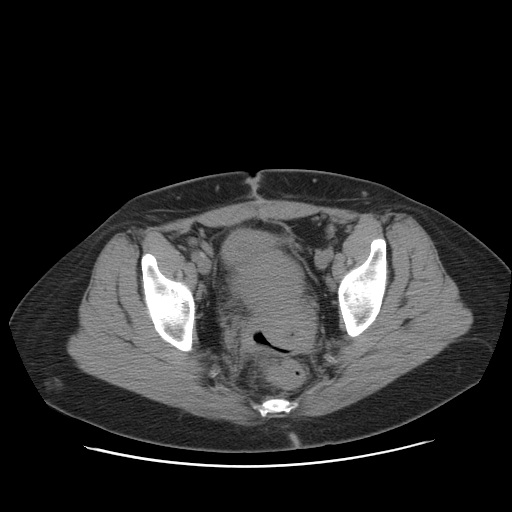
[im 24/86  soft-tissue]
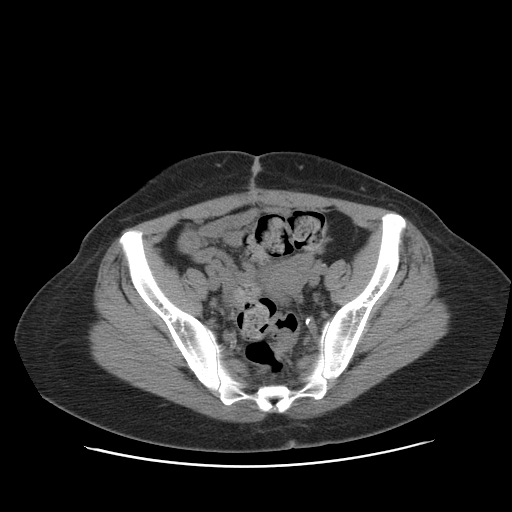
[im 28/86  soft-tissue]
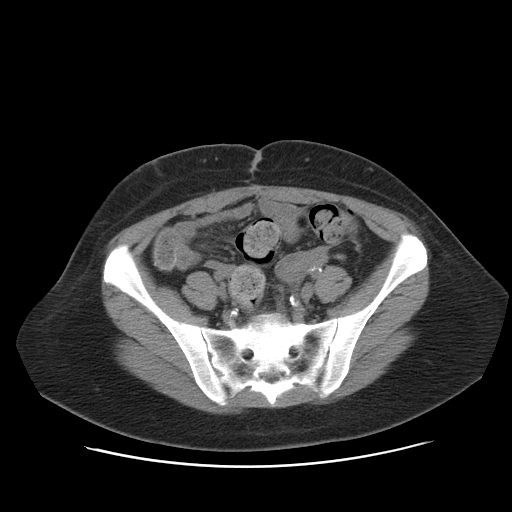
[im 35/86  soft-tissue]
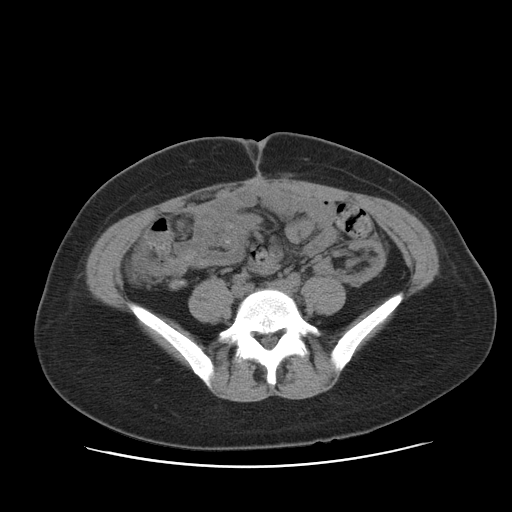
[im 41/86  soft-tissue]
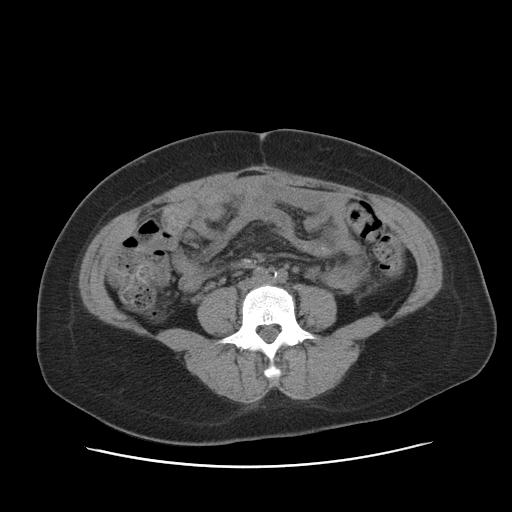
[im 45/86  soft-tissue]
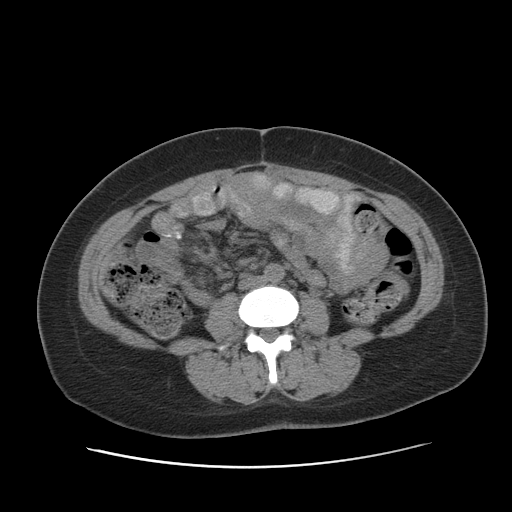
[im 52/86  soft-tissue]
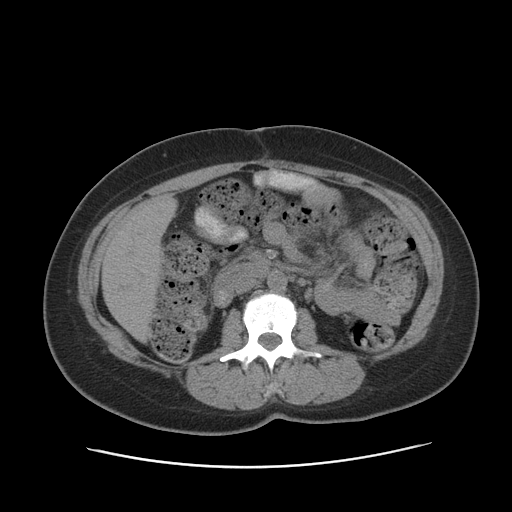
[im 52/86  bone]
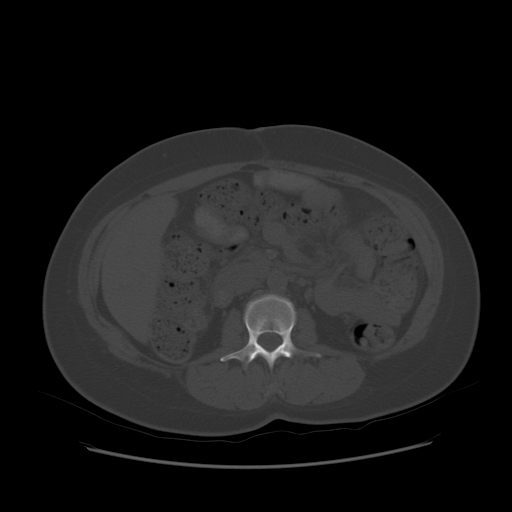
[im 58/86  soft-tissue]
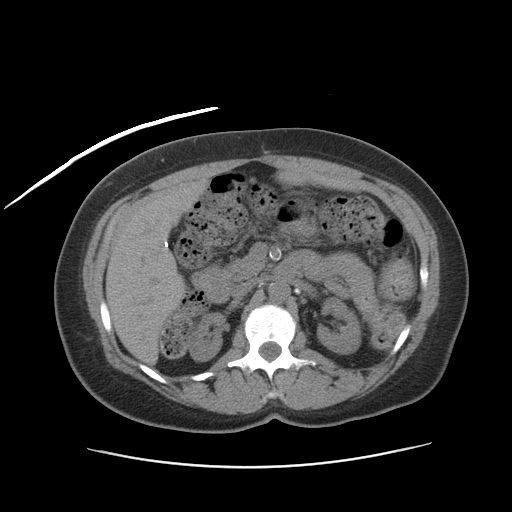
[im 65/86  soft-tissue]
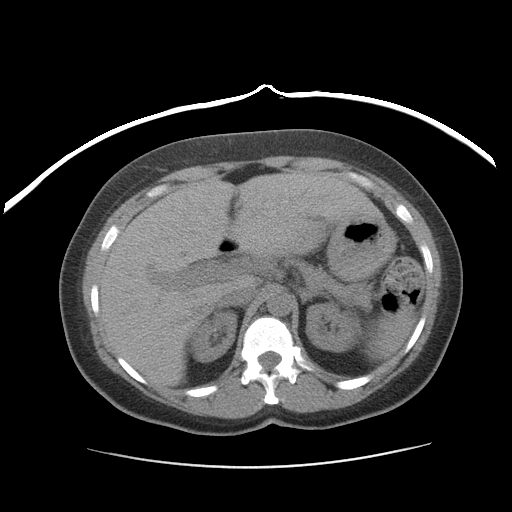
[im 69/86  soft-tissue]
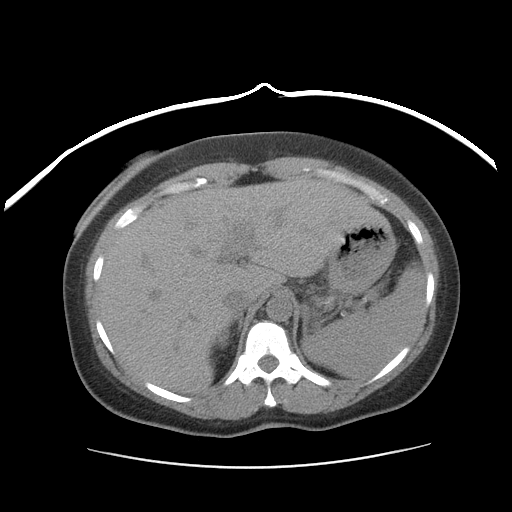
[im 75/86  soft-tissue]
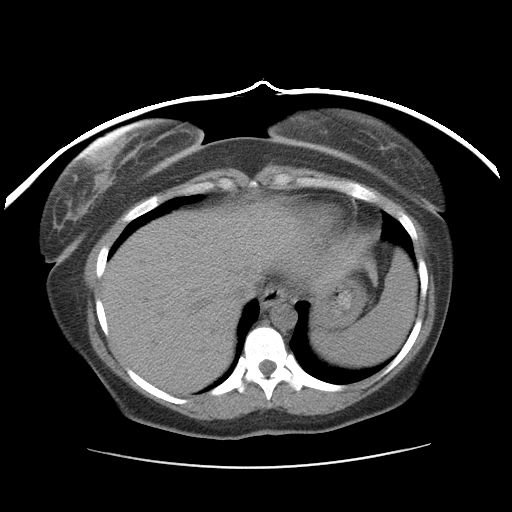
[im 82/86  soft-tissue]
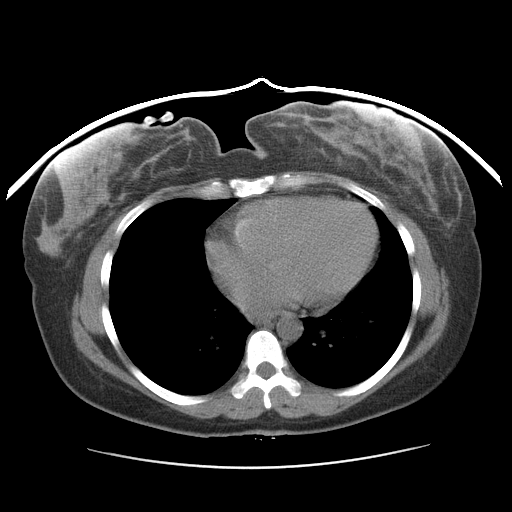

[Series 401: coronal · coronal · 0.86mm/px · 3 of 91 slices shown]
[im 31/91  soft-tissue]
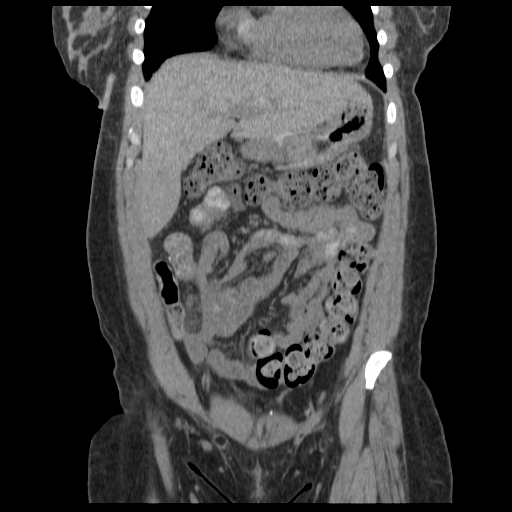
[im 41/91  soft-tissue]
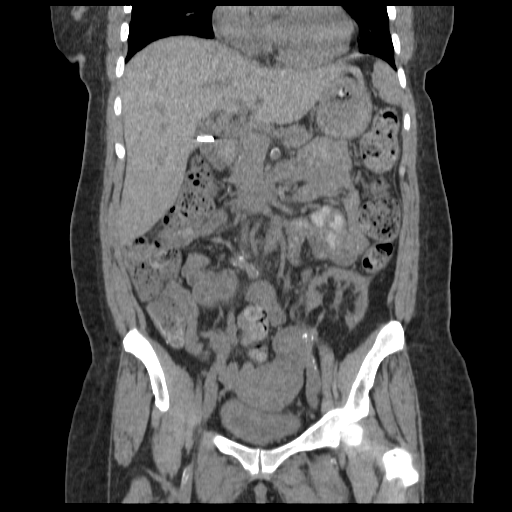
[im 51/91  soft-tissue]
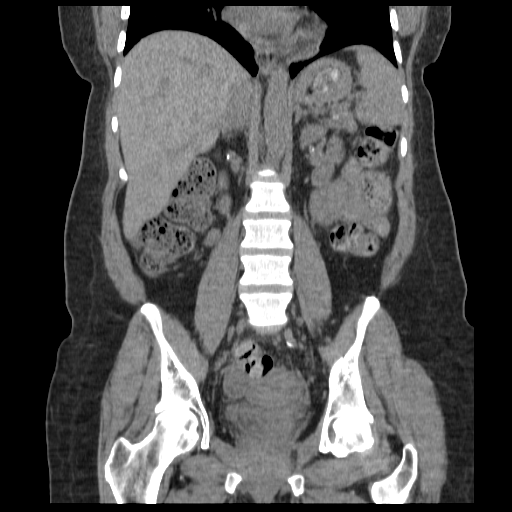

[17 of 46 positions shown; findings below may reference images not displayed]

FINDINGS: Lung bases visualized are clear.  Normal heart size.  No
pericardial or pleural effusion.  No hiatal hernia.

In the abdomen, the patient is status post cholecystectomy.
Kidneys demonstrate diffuse vascular calcifications in the renal
hilar regions.  Kidneys are small and atrophic.  Diffuse cortical
atrophy.  Negative for hydronephrosis, obstruction of the kidneys,
or acute urinary tract calculus.  Ureters are decompressed and
symmetric.  Diffuse vascular calcifications of the aorta and
visceral arteries.

Liver demonstrates no biliary dilatation.  The liver, spleen,
adrenal glands, and pancreas are within normal limits for a
noncontrast exam.  Colon is diffusely stool filled.  No evidence of
small bowel obstruction, ileus, bowel dilatation or free air.

No intra-abdominal fluid collection, free fluid, definite acute
inflammation or ascites.  No visualized abscess within the limits
of noncontrast CT.
IMPRESSION: No acute intra-abdominal process
Previous cholecystectomy
Retained stool throughout the colon consistent with constipation
No definite fluid collection or abscess within limits of
noncontrast CT.

CT PELVIS
FINDINGS: Left pelvic renal transplant noted with diffuse atrophy.
Retained stool in the distal colon as well.  No pelvic fluid
collection, free fluid, definite abscess, hemorrhage, adenopathy,
or inguinal hernia.  Vascular calcifications noted diffusely.
Prominent ovaries bilaterally, limited in visualization.
IMPRESSION: No definite acute intrapelvic process.
No pelvic fluid collection.

## 2011-01-09 ENCOUNTER — Inpatient Hospital Stay (INDEPENDENT_AMBULATORY_CARE_PROVIDER_SITE_OTHER)
Admission: RE | Admit: 2011-01-09 | Discharge: 2011-01-09 | Disposition: A | Discharge status: Home / Self Care | Payer: Medicare Other | Source: Ambulatory Visit | Attending: Family Medicine | Admitting: Family Medicine

## 2011-01-09 DIAGNOSIS — G43009 Migraine without aura, not intractable, without status migrainosus: Secondary | ICD-10-CM

## 2011-01-18 ENCOUNTER — Encounter: Payer: Self-pay | Admitting: Pulmonary Disease

## 2011-01-19 ENCOUNTER — Institutional Professional Consult (permissible substitution): Payer: Medicare Other | Admitting: Pulmonary Disease

## 2011-01-31 ENCOUNTER — Other Ambulatory Visit (HOSPITAL_COMMUNITY): Payer: Self-pay | Admitting: Obstetrics

## 2011-01-31 DIAGNOSIS — Z1231 Encounter for screening mammogram for malignant neoplasm of breast: Secondary | ICD-10-CM

## 2011-02-01 ENCOUNTER — Institutional Professional Consult (permissible substitution): Payer: Medicare Other | Admitting: Internal Medicine

## 2011-02-02 ENCOUNTER — Inpatient Hospital Stay (INDEPENDENT_AMBULATORY_CARE_PROVIDER_SITE_OTHER)
Admission: RE | Admit: 2011-02-02 | Discharge: 2011-02-02 | Disposition: A | Discharge status: Home / Self Care | Payer: Medicare Other | Source: Ambulatory Visit | Attending: Family Medicine | Admitting: Family Medicine

## 2011-02-02 DIAGNOSIS — K089 Disorder of teeth and supporting structures, unspecified: Secondary | ICD-10-CM

## 2011-02-03 IMAGING — CR DG CHEST 2V
2 series · 2 of 2 positions shown · non-contrast
Comparison: 12/10/2008 and CT chest 07/17/2008

CLINICAL DATA: Preop.

CHEST - 2 VIEW

[view not recorded (1 of 2)]
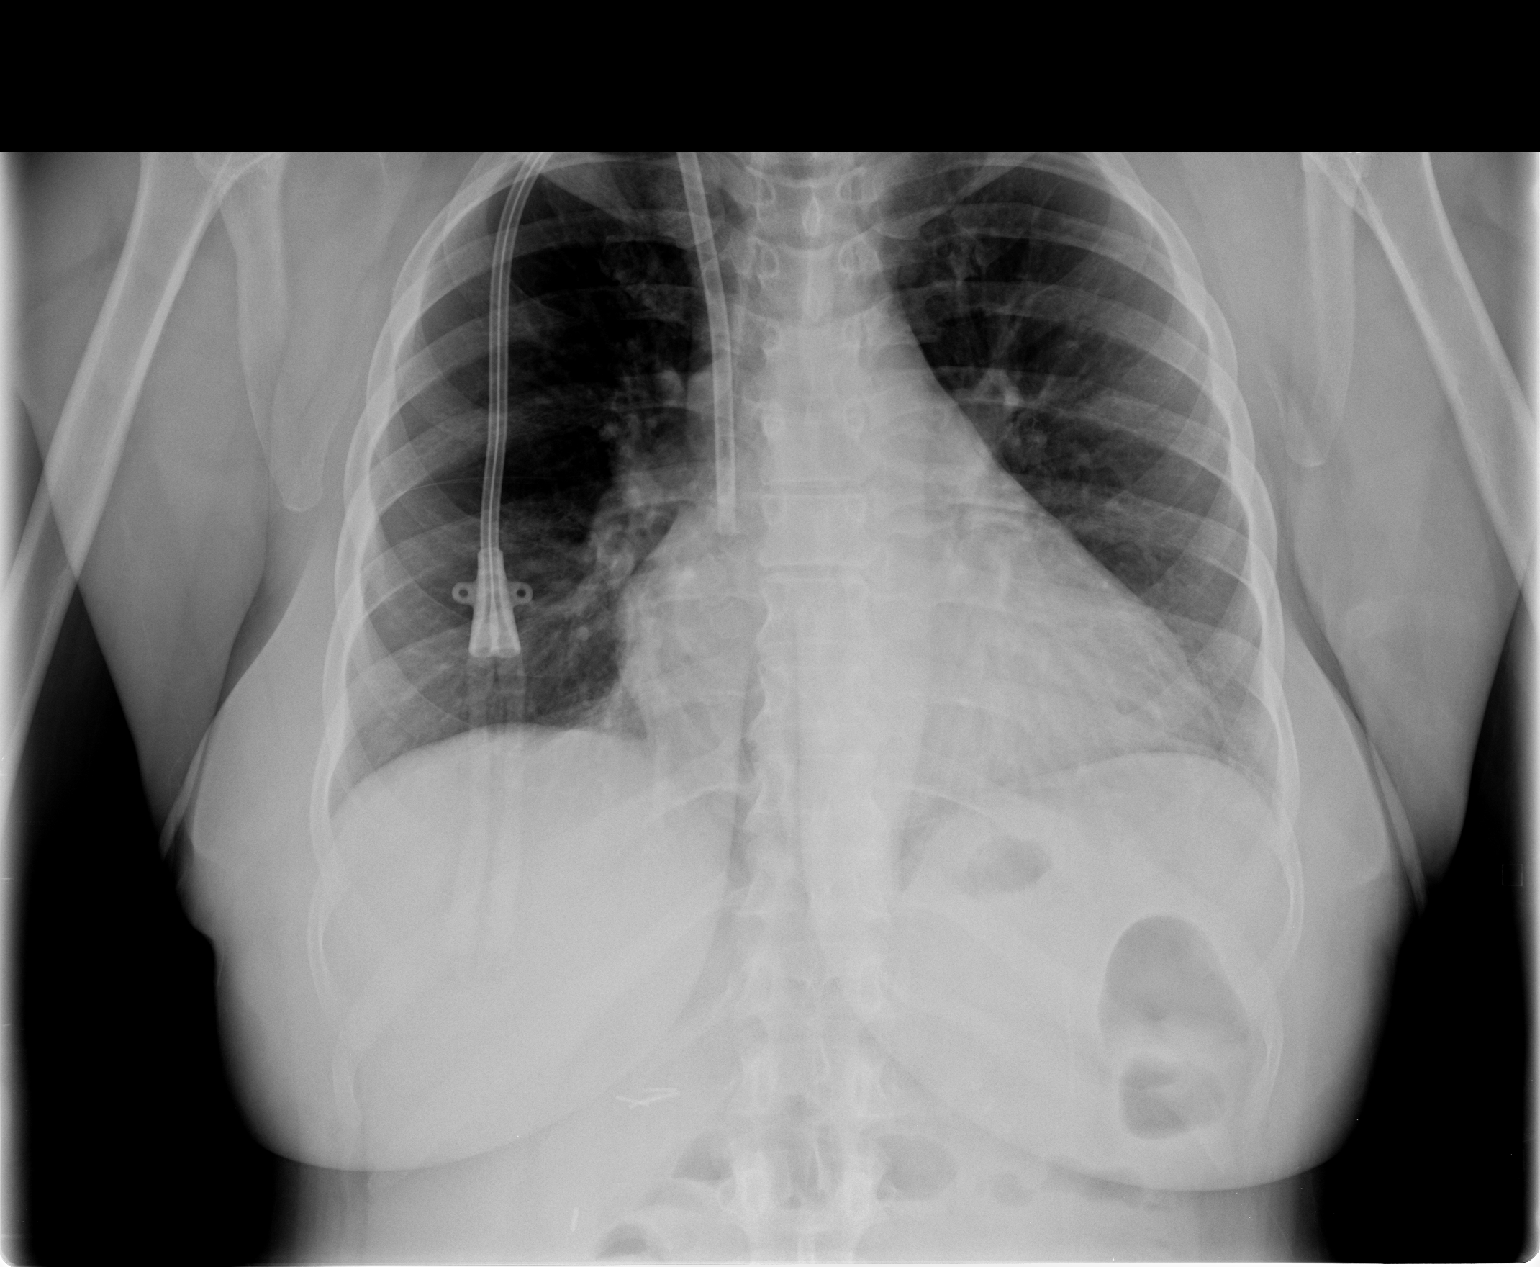

[view not recorded (2 of 2)]
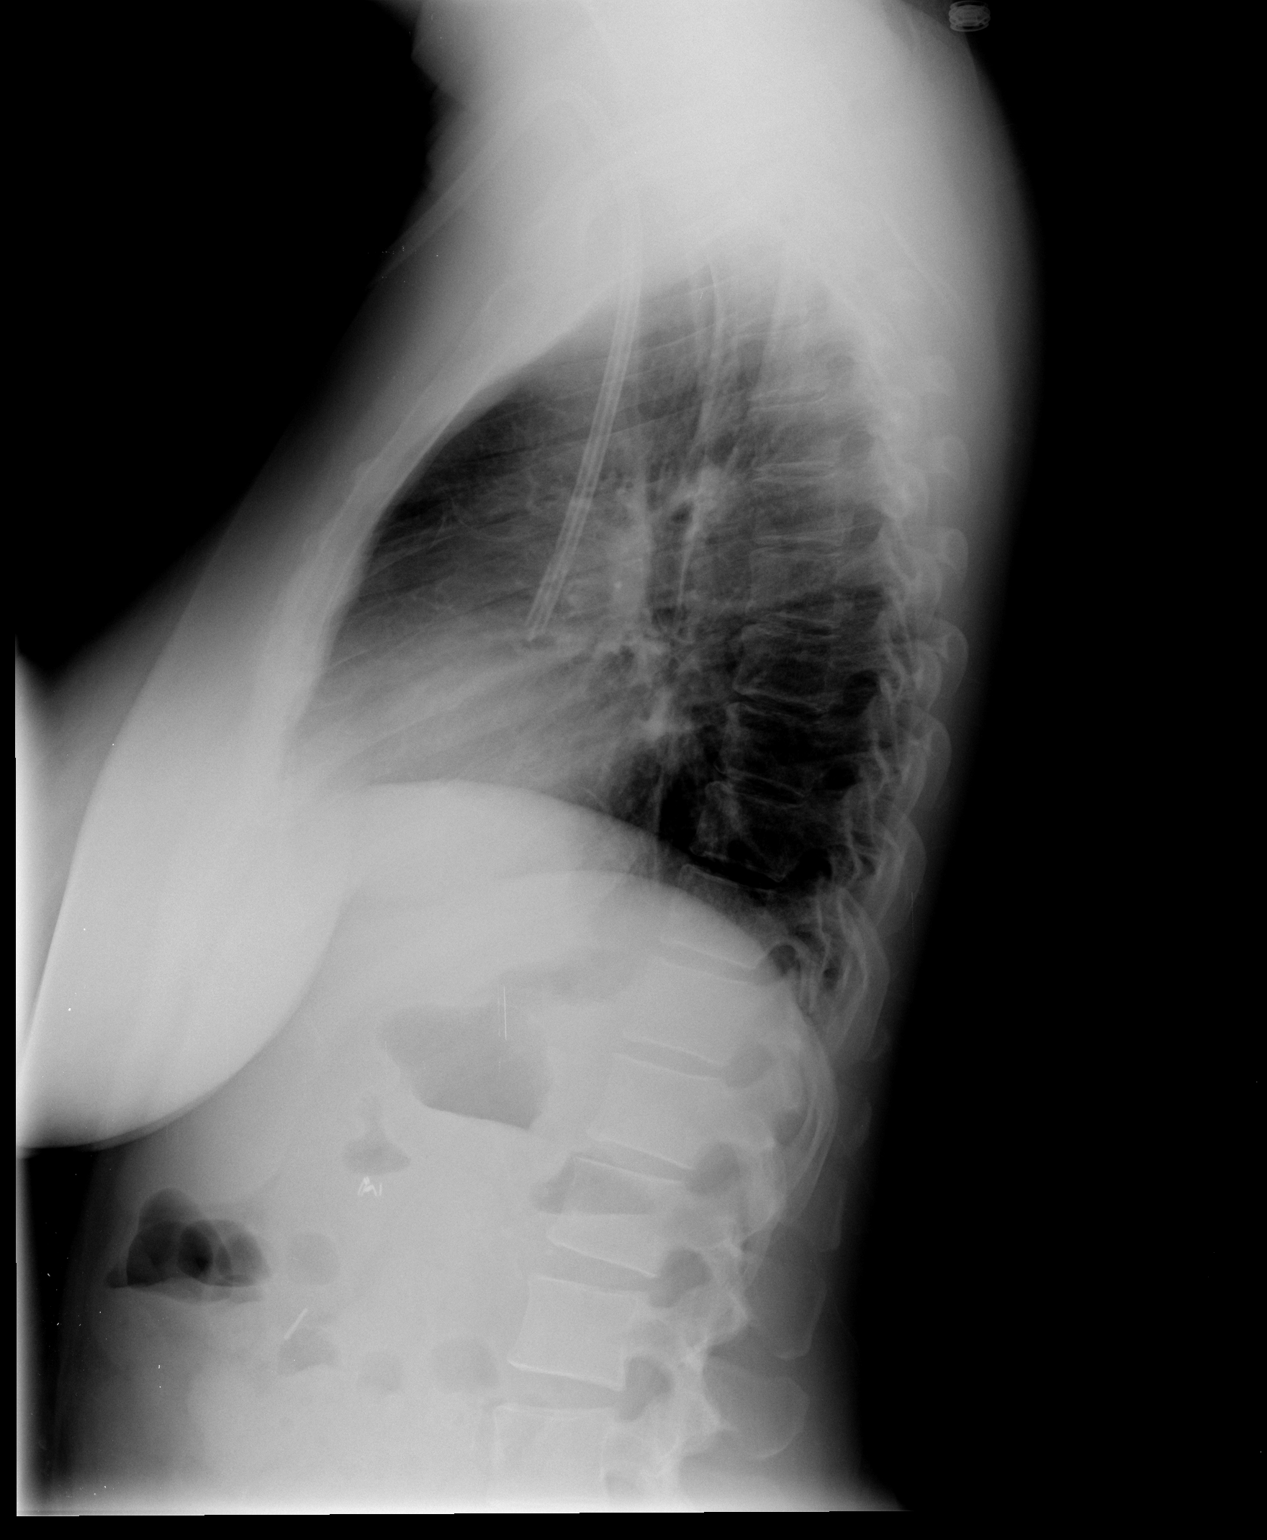

[2 of 2 positions shown; findings below may reference images not displayed]

FINDINGS: Trachea is midline.  Heart size stable.  Right IJ Port-A-
Cath tip projects near the SVC/RA junction.  Mild bibasilar
atelectasis.  Lungs are otherwise clear.  No pleural fluid.
IMPRESSION: Minimal bibasilar atelectasis.

## 2011-02-06 ENCOUNTER — Institutional Professional Consult (permissible substitution): Payer: Medicare Other | Admitting: Emergency Medicine

## 2011-02-07 ENCOUNTER — Emergency Department (HOSPITAL_COMMUNITY): Payer: Medicare Other

## 2011-02-07 ENCOUNTER — Inpatient Hospital Stay (HOSPITAL_COMMUNITY)
Admission: EM | Admit: 2011-02-07 | Discharge: 2011-02-14 | DRG: 193 | Disposition: A | Discharge status: Home / Self Care | Payer: Medicare Other | Attending: Internal Medicine | Admitting: Internal Medicine

## 2011-02-07 ENCOUNTER — Ambulatory Visit (HOSPITAL_COMMUNITY): Payer: Medicare Other

## 2011-02-07 DIAGNOSIS — N2581 Secondary hyperparathyroidism of renal origin: Secondary | ICD-10-CM | POA: Diagnosis present

## 2011-02-07 DIAGNOSIS — Z880 Allergy status to penicillin: Secondary | ICD-10-CM

## 2011-02-07 DIAGNOSIS — J9600 Acute respiratory failure, unspecified whether with hypoxia or hypercapnia: Secondary | ICD-10-CM | POA: Diagnosis present

## 2011-02-07 DIAGNOSIS — Z888 Allergy status to other drugs, medicaments and biological substances status: Secondary | ICD-10-CM

## 2011-02-07 DIAGNOSIS — N186 End stage renal disease: Secondary | ICD-10-CM | POA: Diagnosis present

## 2011-02-07 DIAGNOSIS — Z940 Kidney transplant status: Secondary | ICD-10-CM

## 2011-02-07 DIAGNOSIS — Z8249 Family history of ischemic heart disease and other diseases of the circulatory system: Secondary | ICD-10-CM

## 2011-02-07 DIAGNOSIS — Z91199 Patient's noncompliance with other medical treatment and regimen due to unspecified reason: Secondary | ICD-10-CM

## 2011-02-07 DIAGNOSIS — E1065 Type 1 diabetes mellitus with hyperglycemia: Secondary | ICD-10-CM | POA: Diagnosis not present

## 2011-02-07 DIAGNOSIS — J189 Pneumonia, unspecified organism: Principal | ICD-10-CM | POA: Diagnosis present

## 2011-02-07 DIAGNOSIS — Z992 Dependence on renal dialysis: Secondary | ICD-10-CM

## 2011-02-07 DIAGNOSIS — E7800 Pure hypercholesterolemia, unspecified: Secondary | ICD-10-CM | POA: Diagnosis present

## 2011-02-07 DIAGNOSIS — G43909 Migraine, unspecified, not intractable, without status migrainosus: Secondary | ICD-10-CM | POA: Diagnosis present

## 2011-02-07 DIAGNOSIS — E870 Hyperosmolality and hypernatremia: Secondary | ICD-10-CM | POA: Diagnosis present

## 2011-02-07 DIAGNOSIS — Z794 Long term (current) use of insulin: Secondary | ICD-10-CM

## 2011-02-07 DIAGNOSIS — IMO0002 Reserved for concepts with insufficient information to code with codable children: Secondary | ICD-10-CM

## 2011-02-07 DIAGNOSIS — K219 Gastro-esophageal reflux disease without esophagitis: Secondary | ICD-10-CM | POA: Diagnosis present

## 2011-02-07 DIAGNOSIS — Z79899 Other long term (current) drug therapy: Secondary | ICD-10-CM

## 2011-02-07 DIAGNOSIS — Z7982 Long term (current) use of aspirin: Secondary | ICD-10-CM

## 2011-02-07 DIAGNOSIS — D631 Anemia in chronic kidney disease: Secondary | ICD-10-CM | POA: Diagnosis present

## 2011-02-07 DIAGNOSIS — I120 Hypertensive chronic kidney disease with stage 5 chronic kidney disease or end stage renal disease: Secondary | ICD-10-CM | POA: Diagnosis present

## 2011-02-07 DIAGNOSIS — Z9119 Patient's noncompliance with other medical treatment and regimen: Secondary | ICD-10-CM

## 2011-02-07 DIAGNOSIS — N039 Chronic nephritic syndrome with unspecified morphologic changes: Secondary | ICD-10-CM | POA: Diagnosis present

## 2011-02-07 DIAGNOSIS — Z833 Family history of diabetes mellitus: Secondary | ICD-10-CM

## 2011-02-07 DIAGNOSIS — J209 Acute bronchitis, unspecified: Secondary | ICD-10-CM | POA: Diagnosis not present

## 2011-02-07 DIAGNOSIS — Z9483 Pancreas transplant status: Secondary | ICD-10-CM

## 2011-02-07 DIAGNOSIS — R0902 Hypoxemia: Secondary | ICD-10-CM | POA: Diagnosis present

## 2011-02-07 DIAGNOSIS — E1069 Type 1 diabetes mellitus with other specified complication: Secondary | ICD-10-CM | POA: Diagnosis present

## 2011-02-07 LAB — DIFFERENTIAL
Eosinophils Absolute: 0.1 10*3/uL (ref 0.0–0.7)
Lymphs Abs: 1 10*3/uL (ref 0.7–4.0)
Monocytes Relative: 5 % (ref 3–12)
Neutro Abs: 7.5 10*3/uL (ref 1.7–7.7)
Neutrophils Relative %: 82 % — ABNORMAL HIGH (ref 43–77)

## 2011-02-07 LAB — BASIC METABOLIC PANEL
BUN: 35 mg/dL — ABNORMAL HIGH (ref 6–23)
CO2: 27 mEq/L (ref 19–32)
Chloride: 88 mEq/L — ABNORMAL LOW (ref 96–112)
Creatinine, Ser: 7.36 mg/dL — ABNORMAL HIGH (ref 0.50–1.10)
Glucose, Bld: 817 mg/dL (ref 70–99)

## 2011-02-07 LAB — CBC
Hemoglobin: 10.1 g/dL — ABNORMAL LOW (ref 12.0–15.0)
MCH: 31 pg (ref 26.0–34.0)
MCV: 96.9 fL (ref 78.0–100.0)
RBC: 3.26 MIL/uL — ABNORMAL LOW (ref 3.87–5.11)

## 2011-02-08 ENCOUNTER — Inpatient Hospital Stay (HOSPITAL_COMMUNITY): Payer: Medicare Other

## 2011-02-08 ENCOUNTER — Institutional Professional Consult (permissible substitution): Payer: Medicare Other | Admitting: Internal Medicine

## 2011-02-08 LAB — COMPREHENSIVE METABOLIC PANEL
ALT: 26 U/L (ref 0–35)
AST: 21 U/L (ref 0–37)
Albumin: 3.5 g/dL (ref 3.5–5.2)
Alkaline Phosphatase: 124 U/L — ABNORMAL HIGH (ref 39–117)
Chloride: 88 mEq/L — ABNORMAL LOW (ref 96–112)
Potassium: 3.5 mEq/L (ref 3.5–5.1)
Sodium: 129 mEq/L — ABNORMAL LOW (ref 135–145)
Total Bilirubin: 0.3 mg/dL (ref 0.3–1.2)
Total Protein: 6.9 g/dL (ref 6.0–8.3)

## 2011-02-08 LAB — GLUCOSE, CAPILLARY
Glucose-Capillary: 420 mg/dL — ABNORMAL HIGH (ref 70–99)
Glucose-Capillary: 187 mg/dL — ABNORMAL HIGH (ref 70–99)
Glucose-Capillary: 73 mg/dL (ref 70–99)
Glucose-Capillary: 109 mg/dL — ABNORMAL HIGH (ref 70–99)
Glucose-Capillary: 62 mg/dL — ABNORMAL LOW (ref 70–99)
Glucose-Capillary: 151 mg/dL — ABNORMAL HIGH (ref 70–99)
Glucose-Capillary: 250 mg/dL — ABNORMAL HIGH (ref 70–99)

## 2011-02-08 LAB — CK TOTAL AND CKMB (NOT AT ARMC)
CK, MB: 1.8 ng/mL (ref 0.3–4.0)
CK, MB: 1.6 ng/mL (ref 0.3–4.0)
Relative Index: INVALID (ref 0.0–2.5)
Relative Index: 1.5 (ref 0.0–2.5)
Total CK: 107 U/L (ref 7–177)

## 2011-02-08 LAB — MRSA PCR SCREENING: MRSA by PCR: NEGATIVE

## 2011-02-08 LAB — CBC
HCT: 28.3 % — ABNORMAL LOW (ref 36.0–46.0)
Hemoglobin: 9.4 g/dL — ABNORMAL LOW (ref 12.0–15.0)
MCV: 93.7 fL (ref 78.0–100.0)
WBC: 11.2 10*3/uL — ABNORMAL HIGH (ref 4.0–10.5)

## 2011-02-08 LAB — TROPONIN I: Troponin I: <0.3 ng/mL (ref <0.30)

## 2011-02-09 LAB — GLUCOSE, CAPILLARY
Glucose-Capillary: 496 mg/dL — ABNORMAL HIGH (ref 70–99)
Glucose-Capillary: 94 mg/dL (ref 70–99)
Glucose-Capillary: 47 mg/dL — ABNORMAL LOW (ref 70–99)
Glucose-Capillary: 108 mg/dL — ABNORMAL HIGH (ref 70–99)

## 2011-02-10 ENCOUNTER — Inpatient Hospital Stay (HOSPITAL_COMMUNITY): Payer: Medicare Other

## 2011-02-10 LAB — GLUCOSE, CAPILLARY
Glucose-Capillary: 444 mg/dL — ABNORMAL HIGH (ref 70–99)
Glucose-Capillary: 77 mg/dL (ref 70–99)
Glucose-Capillary: 213 mg/dL — ABNORMAL HIGH (ref 70–99)

## 2011-02-10 LAB — RENAL FUNCTION PANEL
Albumin: 3.1 g/dL — ABNORMAL LOW (ref 3.5–5.2)
Calcium: 9.4 mg/dL (ref 8.4–10.5)
Creatinine, Ser: 8.39 mg/dL — ABNORMAL HIGH (ref 0.50–1.10)
GFR calc non Af Amer: 5 mL/min — ABNORMAL LOW (ref >60)
Phosphorus: 2.4 mg/dL (ref 2.3–4.6)

## 2011-02-10 LAB — GLUCOSE, RANDOM: Glucose, Bld: 445 mg/dL — ABNORMAL HIGH (ref 70–99)

## 2011-02-10 LAB — CBC
MCH: 30.7 pg (ref 26.0–34.0)
MCV: 95.1 fL (ref 78.0–100.0)
Platelets: 142 10*3/uL — ABNORMAL LOW (ref 150–400)
RDW: 14.2 % (ref 11.5–15.5)
WBC: 5.9 10*3/uL (ref 4.0–10.5)

## 2011-02-11 LAB — GLUCOSE, CAPILLARY
Glucose-Capillary: 401 mg/dL — ABNORMAL HIGH (ref 70–99)
Glucose-Capillary: 87 mg/dL (ref 70–99)
Glucose-Capillary: 53 mg/dL — ABNORMAL LOW (ref 70–99)
Glucose-Capillary: 114 mg/dL — ABNORMAL HIGH (ref 70–99)
Glucose-Capillary: 516 mg/dL — ABNORMAL HIGH (ref 70–99)

## 2011-02-12 ENCOUNTER — Inpatient Hospital Stay (HOSPITAL_COMMUNITY): Payer: Medicare Other

## 2011-02-12 LAB — CBC
HCT: 27.9 % — ABNORMAL LOW (ref 36.0–46.0)
Hemoglobin: 9.2 g/dL — ABNORMAL LOW (ref 12.0–15.0)
MCH: 31.1 pg (ref 26.0–34.0)
MCHC: 33 g/dL (ref 30.0–36.0)
RDW: 14.4 % (ref 11.5–15.5)

## 2011-02-12 LAB — GLUCOSE, CAPILLARY
Glucose-Capillary: 174 mg/dL — ABNORMAL HIGH (ref 70–99)
Glucose-Capillary: 89 mg/dL (ref 70–99)
Glucose-Capillary: 187 mg/dL — ABNORMAL HIGH (ref 70–99)
Glucose-Capillary: 423 mg/dL — ABNORMAL HIGH (ref 70–99)

## 2011-02-12 LAB — RENAL FUNCTION PANEL
Albumin: 2.9 g/dL — ABNORMAL LOW (ref 3.5–5.2)
BUN: 28 mg/dL — ABNORMAL HIGH (ref 6–23)
Calcium: 8.8 mg/dL (ref 8.4–10.5)
Glucose, Bld: 123 mg/dL — ABNORMAL HIGH (ref 70–99)
Phosphorus: 2.7 mg/dL (ref 2.3–4.6)

## 2011-02-13 LAB — BASIC METABOLIC PANEL
BUN: 17 mg/dL (ref 6–23)
Calcium: 7.9 mg/dL — ABNORMAL LOW (ref 8.4–10.5)
Creatinine, Ser: 5.41 mg/dL — ABNORMAL HIGH (ref 0.50–1.10)
GFR calc Af Amer: 11 mL/min — ABNORMAL LOW (ref >60)
GFR calc non Af Amer: 9 mL/min — ABNORMAL LOW (ref >60)

## 2011-02-13 LAB — GLUCOSE, CAPILLARY
Glucose-Capillary: 165 mg/dL — ABNORMAL HIGH (ref 70–99)
Glucose-Capillary: >600 mg/dL (ref 70–99)

## 2011-02-13 NOTE — H&P (Signed)
[name], [name]               ACCOUNT NO.:  [id number]  MEDICAL RECORD NO.:  [id number]  LOCATION:  2609                         FACILITY:  [facility]  PHYSICIAN:  Burleigh Brockmann, DO         DATE OF BIRTH:  April 02, 1971  DATE OF ADMISSION:  12/19/2010 DATE OF DISCHARGE:                             HISTORY & PHYSICAL   CHIEF COMPLAINT:  Hyperglycemia.  HISTORY OF PRESENT ILLNESS:  The patient is a 40 year old female with dialysis.  The patient was in dialysis today and she was to receive her usual dose of Lantus, which is 5 units.  There was a decimal point error and the patient received 50 units of Lantus.  The patient required over 11 doses of D50 at the dialysis center today, and she was transferred to emergency department for admission for continued IV supplementation of her glucose while we await, no wearing off of her Lantus.  The patient states that she has a headache that started when they started giving her D50.  She also states preceding this event, she had nausea and vomiting for 2 days, but no fever or chills.  PAST MEDICAL HISTORY:  Significant for type 1 diabetes mellitus.  She is status post pancreatic renal transplant.  She has dialysis on Tuesdays, Thursdays, and Saturdays, anemia of chronic disease, hypertension, secondary hypoparathyroidism.  PAST SURGICAL HISTORY:  None, other than her pancreatic and renal transplant.  MEDICATIONS: 1. Ambien 10 mg 1 p.o. at bedtime. 2. Enteric coated aspirin 81 mg 1 p.o. daily. 3. CellCept 500 mg 1 p.o. b.i.d. 4. Clonazepam 1 mg 2 tablets p.o. at bedtime. 5. Diphenhydramine 25 mg 2-3 tablets b.i.d. 6. Lantus 5 units subcu at bedtime. 7. Metoprolol XL 100 mg 1 p.o. at bedtime. 8. Norvasc 10 mg 1 p.o. at bedtime. 9. NovoLog sliding scale. 10.Phenergan 25 mg 1 p.o. b.i.d. 11.PhosLo 667 mg 3 tablets t.i.d. with meals. 12.Prednisone 5 mg one p.o. daily. 13.Prograf 1 mg 3 capsules b.i.d. 14.Protonix 40 mg 1 p.o. daily. 15.Renal  vitamine 1 tablet p.o. at bedtime.  ALLERGIES:  MORPHINE SULFATE and TRAMADOL as well as CHLORHEXIDINE, FLAGYL, VICODIN, PENICILLIN, ZOLOFT, and ALBUTEROL.  REVIEW OF SYSTEMS:  CONSTITUTIONAL:  Negative for fever.  Negative for chills.  Positive for weakness.  Positive for fatigue.  CNS: Positive for headaches, negative for seizures, negative for loss of consciousness.  ENT:  No nasal congestion, throat pain, or coryza. CARDIOVASCULAR:  No chest pain.  No palpitations.  No orthopnea. RESPIRATORY:  No cough, no shortness of breath, no wheezing. GASTROINTESTINAL:  Positive for nausea, positive for vomiting.  Negative for constipation, negative for diarrhea.  GENITOURINARY:  No dysuria, no hematuria.  No urinary frequency.  RENAL:  No flank pain.  No swelling, no pruritus.  SKIN:  No rashes, sores, or lesions.  HEMATOLOGIC:  No easy bruising, no purpura, no clots.  LYMPH:  No lymphadenopathy.  No painful nodes.  No specific lymph swelling.  PSYCHIATRIC:  Positive for anxiety,  Negative for depression. Positive for insomnia.  SOCIAL HISTORY:  No tobacco.  No alcohol.  No recreational drug use. She lives alone.  FAMILY HISTORY:  Positive for hypertension, diabetes mellitus, coronary  artery disease.  PHYSICAL EXAMINATION:  VITAL SIGNS:  Temperature  98.3, pulse 72, respiratory rate 18, blood pressure 166/83, O2 sats 97% on room air. GENERAL:  The patient is awake, alert, and oriented x3, and able to give fair history. EYES:  Pupils equal, round, reactive to light and accommodation. External ocular movements bilaterally intact.  Sclarea nonicteric, noninjected. MOUTH:  Oral mucosa is moist.  No lesions, no sores. PHARYNX:  Clear.  No erythema.  No exudate. NECK:  Negative for JVD.  Negative for visceromegaly.  Negative for lymphadenopathy. HEART:  Regular rate and rhythm at 80 beats per minute without murmurs, ectopy, or gallops.  No lateral PMI.  No thrills. LUNGS:  Clear to  auscultation bilaterally without wheezes, rales, or rhonchi.  No increased work of breathing.  No tactile fremitus. ABDOMEN:  Soft, nontender, nondistended.  Positive bowel sounds.  No hepatosplenomegaly.  No hernias palpated. EXTREMITIES:  Negative for cyanosis, clubbing or edema.  The patient has somewhat diminished dorsalis pedis and popliteal pulses bilaterally.  No carotid or femoral bruits bilaterally. NEUROLOGIC:  Cranial nerves II through XII grossly intact.  Motor and sensory intact.  LABORATORY DATA:  WBC is 12.7, hemoglobin 11.7, hematocrit 35.6, platelets 269, 79% neutrophils.  Sodium 138, potassium 3.7, chloride 95, CO2 of 33, BUN is 22, creatinine 5.42, glucose 58, calcium 8.9.  ASSESSMENT: 1. Hyperglycemia secondary to medication adherence questionnaire.     This is severe and persistent. 2. Headache secondary to hyperglycemia. 3. Concern for gastroenteritis, patient with nausea and vomiting for 2     days, and an elevated white count, rule out urinary tract     infection. 4. End-stage renal disease, on dialysis. 5. Anemia of chronic disease. 6. Status post pancreatic and renal transplant.  PLAN:  We will admit the patient to stepdown unit.  She will require close following up for blood sugars.  She will have  q.1 h. fingerstick blood sugars.  She will be given D10 of 60 mL in hour.  We will check UA and CNS.  She will be started on empiric antibiotics.  This patient is immunosuppressed, and we will continue her home meds with the exception of her insulin.          ______________________________ Fran Lowes, DO     AS/MEDQ  D:  12/19/2010  T:  12/20/2010  Job:  [id number]  cc:   Candyce Churn. Allyne Gee, M.D. Duke Salvia Eliott Nine, M.D.  Electronically Signed by Fran Lowes DO on 02/13/2011 10:31:23 PM

## 2011-02-14 LAB — BASIC METABOLIC PANEL WITH GFR
BUN: 31 mg/dL — ABNORMAL HIGH (ref 6–23)
CO2: 27 meq/L (ref 19–32)
Calcium: 8 mg/dL — ABNORMAL LOW (ref 8.4–10.5)
Chloride: 95 meq/L — ABNORMAL LOW (ref 96–112)
Creatinine, Ser: 7.88 mg/dL — ABNORMAL HIGH (ref 0.50–1.10)
GFR calc Af Amer: 7 mL/min — ABNORMAL LOW
GFR calc non Af Amer: 6 mL/min — ABNORMAL LOW
Glucose, Bld: 318 mg/dL — ABNORMAL HIGH (ref 70–99)
Potassium: 5.1 meq/L (ref 3.5–5.1)
Sodium: 134 meq/L — ABNORMAL LOW (ref 135–145)

## 2011-02-14 LAB — GLUCOSE, CAPILLARY
Glucose-Capillary: 257 mg/dL — ABNORMAL HIGH (ref 70–99)
Glucose-Capillary: 290 mg/dL — ABNORMAL HIGH (ref 70–99)

## 2011-02-18 ENCOUNTER — Emergency Department (HOSPITAL_COMMUNITY): Payer: Medicare Other

## 2011-02-18 ENCOUNTER — Inpatient Hospital Stay (HOSPITAL_COMMUNITY)
Admission: EM | Admit: 2011-02-18 | Discharge: 2011-03-08 | DRG: 193 | Disposition: A | Discharge status: Home / Self Care | Payer: Medicare Other | Attending: Internal Medicine | Admitting: Internal Medicine

## 2011-02-18 DIAGNOSIS — E870 Hyperosmolality and hypernatremia: Secondary | ICD-10-CM | POA: Diagnosis present

## 2011-02-18 DIAGNOSIS — Z91199 Patient's noncompliance with other medical treatment and regimen due to unspecified reason: Secondary | ICD-10-CM

## 2011-02-18 DIAGNOSIS — E1065 Type 1 diabetes mellitus with hyperglycemia: Secondary | ICD-10-CM | POA: Diagnosis present

## 2011-02-18 DIAGNOSIS — E875 Hyperkalemia: Secondary | ICD-10-CM | POA: Diagnosis present

## 2011-02-18 DIAGNOSIS — J189 Pneumonia, unspecified organism: Principal | ICD-10-CM | POA: Diagnosis present

## 2011-02-18 DIAGNOSIS — E785 Hyperlipidemia, unspecified: Secondary | ICD-10-CM | POA: Diagnosis present

## 2011-02-18 DIAGNOSIS — D638 Anemia in other chronic diseases classified elsewhere: Secondary | ICD-10-CM | POA: Diagnosis present

## 2011-02-18 DIAGNOSIS — Z9483 Pancreas transplant status: Secondary | ICD-10-CM

## 2011-02-18 DIAGNOSIS — N2581 Secondary hyperparathyroidism of renal origin: Secondary | ICD-10-CM | POA: Diagnosis present

## 2011-02-18 DIAGNOSIS — Z9119 Patient's noncompliance with other medical treatment and regimen: Secondary | ICD-10-CM

## 2011-02-18 DIAGNOSIS — Z992 Dependence on renal dialysis: Secondary | ICD-10-CM

## 2011-02-18 DIAGNOSIS — Z7982 Long term (current) use of aspirin: Secondary | ICD-10-CM

## 2011-02-18 DIAGNOSIS — N186 End stage renal disease: Secondary | ICD-10-CM | POA: Diagnosis present

## 2011-02-18 DIAGNOSIS — Z1231 Encounter for screening mammogram for malignant neoplasm of breast: Secondary | ICD-10-CM

## 2011-02-18 DIAGNOSIS — E1069 Type 1 diabetes mellitus with other specified complication: Secondary | ICD-10-CM | POA: Diagnosis present

## 2011-02-18 DIAGNOSIS — E871 Hypo-osmolality and hyponatremia: Secondary | ICD-10-CM | POA: Diagnosis present

## 2011-02-18 DIAGNOSIS — Z794 Long term (current) use of insulin: Secondary | ICD-10-CM

## 2011-02-18 DIAGNOSIS — Z940 Kidney transplant status: Secondary | ICD-10-CM

## 2011-02-18 DIAGNOSIS — I120 Hypertensive chronic kidney disease with stage 5 chronic kidney disease or end stage renal disease: Secondary | ICD-10-CM | POA: Diagnosis present

## 2011-02-18 LAB — DIFFERENTIAL
Basophils Absolute: 0 10*3/uL (ref 0.0–0.1)
Eosinophils Relative: 2 % (ref 0–5)
Lymphocytes Relative: 13 % (ref 12–46)
Monocytes Absolute: 0.6 10*3/uL (ref 0.1–1.0)

## 2011-02-18 LAB — BASIC METABOLIC PANEL
Chloride: 81 mEq/L — ABNORMAL LOW (ref 96–112)
GFR calc Af Amer: 9 mL/min — ABNORMAL LOW (ref >60)
GFR calc non Af Amer: 8 mL/min — ABNORMAL LOW (ref >60)
Potassium: 6.1 mEq/L — ABNORMAL HIGH (ref 3.5–5.1)
Sodium: 120 mEq/L — ABNORMAL LOW (ref 135–145)

## 2011-02-18 LAB — CBC
HCT: 28.8 % — ABNORMAL LOW (ref 36.0–46.0)
MCHC: 30.2 g/dL (ref 30.0–36.0)
MCV: 100.3 fL — ABNORMAL HIGH (ref 78.0–100.0)
RDW: 14.3 % (ref 11.5–15.5)

## 2011-02-18 LAB — COMPREHENSIVE METABOLIC PANEL
ALT: 18 U/L (ref 0–35)
AST: 27 U/L (ref 0–37)
Albumin: 3.6 g/dL (ref 3.5–5.2)
CO2: 26 mEq/L (ref 19–32)
Calcium: 7.8 mg/dL — ABNORMAL LOW (ref 8.4–10.5)
Chloride: 80 mEq/L — ABNORMAL LOW (ref 96–112)
Creatinine, Ser: 6.07 mg/dL — ABNORMAL HIGH (ref 0.50–1.10)
GFR calc non Af Amer: 8 mL/min — ABNORMAL LOW (ref >60)
Sodium: 122 mEq/L — ABNORMAL LOW (ref 135–145)
Total Bilirubin: 0.4 mg/dL (ref 0.3–1.2)

## 2011-02-18 LAB — GLUCOSE, CAPILLARY
Glucose-Capillary: >600 mg/dL (ref 70–99)
Glucose-Capillary: 519 mg/dL — ABNORMAL HIGH (ref 70–99)
Glucose-Capillary: 131 mg/dL — ABNORMAL HIGH (ref 70–99)
Glucose-Capillary: 95 mg/dL (ref 70–99)
Glucose-Capillary: 311 mg/dL — ABNORMAL HIGH (ref 70–99)

## 2011-02-18 LAB — POCT I-STAT, CHEM 8
BUN: 27 mg/dL — ABNORMAL HIGH (ref 6–23)
Creatinine, Ser: 5.9 mg/dL — ABNORMAL HIGH (ref 0.50–1.10)
Potassium: 5.3 mEq/L — ABNORMAL HIGH (ref 3.5–5.1)
Sodium: 121 mEq/L — ABNORMAL LOW (ref 135–145)
TCO2: 24 mmol/L (ref 0–100)

## 2011-02-18 LAB — LACTIC ACID, PLASMA: Lactic Acid, Venous: 2.1 mmol/L (ref 0.5–2.2)

## 2011-02-19 LAB — GLUCOSE, CAPILLARY
Glucose-Capillary: 362 mg/dL — ABNORMAL HIGH (ref 70–99)
Glucose-Capillary: 327 mg/dL — ABNORMAL HIGH (ref 70–99)
Glucose-Capillary: 355 mg/dL — ABNORMAL HIGH (ref 70–99)

## 2011-02-19 LAB — CBC
Platelets: 210 10*3/uL (ref 150–400)
RBC: 2.55 MIL/uL — ABNORMAL LOW (ref 3.87–5.11)
RDW: 13.8 % (ref 11.5–15.5)
WBC: 9 10*3/uL (ref 4.0–10.5)

## 2011-02-19 LAB — RENAL FUNCTION PANEL
Albumin: 3.3 g/dL — ABNORMAL LOW (ref 3.5–5.2)
Chloride: 89 mEq/L — ABNORMAL LOW (ref 96–112)
Creatinine, Ser: 7.46 mg/dL — ABNORMAL HIGH (ref 0.50–1.10)
GFR calc Af Amer: 7 mL/min — ABNORMAL LOW (ref >60)
GFR calc non Af Amer: 6 mL/min — ABNORMAL LOW (ref >60)
Potassium: 5.4 mEq/L — ABNORMAL HIGH (ref 3.5–5.1)
Sodium: 129 mEq/L — ABNORMAL LOW (ref 135–145)

## 2011-02-19 LAB — BASIC METABOLIC PANEL
CO2: 27 mEq/L (ref 19–32)
Chloride: 88 mEq/L — ABNORMAL LOW (ref 96–112)
Creatinine, Ser: 7.61 mg/dL — ABNORMAL HIGH (ref 0.50–1.10)
Glucose, Bld: 282 mg/dL — ABNORMAL HIGH (ref 70–99)

## 2011-02-20 ENCOUNTER — Inpatient Hospital Stay (HOSPITAL_COMMUNITY): Payer: Medicare Other

## 2011-02-20 LAB — RENAL FUNCTION PANEL
BUN: 47 mg/dL — ABNORMAL HIGH (ref 6–23)
CO2: 23 mEq/L (ref 19–32)
Chloride: 89 mEq/L — ABNORMAL LOW (ref 96–112)
Glucose, Bld: 699 mg/dL (ref 70–99)
Potassium: 5.4 mEq/L — ABNORMAL HIGH (ref 3.5–5.1)

## 2011-02-20 LAB — CBC
HCT: 25.6 % — ABNORMAL LOW (ref 36.0–46.0)
Hemoglobin: 7.9 g/dL — ABNORMAL LOW (ref 12.0–15.0)
MCH: 29.8 pg (ref 26.0–34.0)
MCHC: 30.9 g/dL (ref 30.0–36.0)

## 2011-02-20 LAB — GLUCOSE, CAPILLARY
Glucose-Capillary: 427 mg/dL — ABNORMAL HIGH (ref 70–99)
Glucose-Capillary: 292 mg/dL — ABNORMAL HIGH (ref 70–99)

## 2011-02-21 LAB — RENAL FUNCTION PANEL
BUN: 25 mg/dL — ABNORMAL HIGH (ref 6–23)
CO2: 29 mEq/L (ref 19–32)
Chloride: 97 mEq/L (ref 96–112)
Creatinine, Ser: 5.35 mg/dL — ABNORMAL HIGH (ref 0.50–1.10)
GFR calc non Af Amer: 9 mL/min — ABNORMAL LOW (ref >60)
Potassium: 4 mEq/L (ref 3.5–5.1)

## 2011-02-21 LAB — GLUCOSE, CAPILLARY: Glucose-Capillary: 183 mg/dL — ABNORMAL HIGH (ref 70–99)

## 2011-02-21 LAB — CBC
HCT: 25.5 % — ABNORMAL LOW (ref 36.0–46.0)
MCV: 96.6 fL (ref 78.0–100.0)
Platelets: 243 10*3/uL (ref 150–400)
RBC: 2.64 MIL/uL — ABNORMAL LOW (ref 3.87–5.11)
WBC: 10.2 10*3/uL (ref 4.0–10.5)

## 2011-02-21 LAB — GLUCOSE, RANDOM: Glucose, Bld: 675 mg/dL (ref 70–99)

## 2011-02-22 LAB — BASIC METABOLIC PANEL
CO2: 28 mEq/L (ref 19–32)
Chloride: 89 mEq/L — ABNORMAL LOW (ref 96–112)
Glucose, Bld: 542 mg/dL — ABNORMAL HIGH (ref 70–99)
Sodium: 129 mEq/L — ABNORMAL LOW (ref 135–145)

## 2011-02-22 LAB — GLUCOSE, CAPILLARY
Glucose-Capillary: 466 mg/dL — ABNORMAL HIGH (ref 70–99)
Glucose-Capillary: 94 mg/dL (ref 70–99)
Glucose-Capillary: 44 mg/dL — CL (ref 70–99)
Glucose-Capillary: 56 mg/dL — ABNORMAL LOW (ref 70–99)
Glucose-Capillary: 59 mg/dL — ABNORMAL LOW (ref 70–99)
Glucose-Capillary: 151 mg/dL — ABNORMAL HIGH (ref 70–99)

## 2011-02-22 NOTE — H&P (Signed)
[name], [name] NO.:  [id number]  MEDICAL RECORD NO.:  [id number]  LOCATION:  [facility]                         FACILITY:  [facility]  PHYSICIAN:  Elliot Cousin, M.D.    DATE OF BIRTH:  11/18/1970  DATE OF ADMISSION:  02/18/2011 DATE OF DISCHARGE:                             HISTORY & PHYSICAL   PRIMARY CARE PHYSICIAN:  The patient is unassigned, however, her appointment with Dorothyann Peng, MD, is pending.  PRIMARY NEPHROLOGIST:  [employer].  PRIMARY NEPHROLOGIST:  [facility]. Eliott Nine, MD  CHIEF COMPLAINT:  Shortness of breath, wheezing, and nausea and vomiting.  HISTORY OF PRESENT ILLNESS:  The patient is a 40 year old woman with a past medical history significant for brittle type 1 diabetes mellitus, end-stage renal disease on dialysis, and hypertension, who presents to the emergency department today with a chief complaint of shortness of breath, chest tightness, and wheezing.  She has also had intermittent episodes of nausea and vomiting over the past 2 days.  She was just discharged from the hospital on February 14, 2011, for treatment of bronchitic pneumonia and uncontrolled diabetes mellitus.  Prior to discharge, she says that she continued to have some chest tightness and shortness of breath.  She was discharged on Avelox, which she completed last night.  She has had progressive shortness of breath with activity, but not at rest and not when she lies flat.  She has had more chest tightness and wheezing.  She has had a nonproductive cough most of the time.  Occasionally, she has had a productive cough with blood-tinged sputum.  She has had several episodes of nausea and vomiting over the past 2 days, none with coffee-ground emesis.  However, she states that she did see a little red blood in her emesis.  She denies abdominal pain, pain with urination, or subjective fever and chills.  She did have a fever yesterday at dialysis according to  her.  She also notes that her blood sugars have been running high.  She took additional NovoLog twice last night and again this morning.  She believes that she needs to be on more insulin.  In the emergency department, the patient is noted to be afebrile and hypertensive with a blood pressure of 168/75.  The ED notes indicate that her oxygen saturation was 79%.  She was placed on 2 liters of oxygen and it increased to 100%.  Her EKG reveals normal sinus rhythm with no significant ST-T wave abnormalities.  Her chest x-ray reveals near complete resolution of the previously identified pneumonia in the lower lobes with mild patchy airspace opacities persisting, stable mild cardiomegaly, and mild pulmonary venous hypertension without overt edema.  Her lab data are significant for a glucose of 1020, sodium of 122, potassium of 4.5, normal liver transaminases, normal lactic acid, and a macrocytic anemia with a hemoglobin of 8.7 and MCV of 100.3.  She is being admitted for further evaluation and management.  PAST MEDICAL HISTORY: 1. Type 1 brittle diabetes mellitus, diagnosed when she was 40 years     old.  Her hemoglobin A1c was 12.8 in June 2012. 2. End-stage renal disease, on dialysis. 3. Status post pancreatic and  renal transplant. 4. 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 5. History of infected left upper extremity AV graft, status post     removal. 6. Status post right upper extremity basilic vein transposition in     August 2011. 7. History of steal syndrome in August 2011 in the right upper     extremity. 8. History of ventilator-dependent respiratory failure in March 2010     secondary to pulmonary edema. 9. Anemia of chronic disease. 10.Hypertension. 11.Hyperlipidemia. 12.Gastroesophageal reflux disease. 13.Secondary hyperparathyroidism. 14.Recent hospitalization for bronchitis/pneumonia.  MEDICATIONS:  The list is taken from the discharge summary on February 13, 2011.  The patient states that no medications have changed except that she has completed Avelox. 1. Lisinopril 10 mg nightly. 2. Albuterol inhaler 2 puffs every 4 hours as needed. 3. Ambien 10 mg nightly. 4. Aspirin 81 mg daily. 5. CellCept 500 mg b.i.d. 6. Clonazepam 1 mg 2 tablets nightly. 7. Diphenhydramine (Benadryl) 25 mg 2-3 tablets twice daily as needed. 8. Flexeril 10 mg nightly. 9. Lantus insulin 10 units subcu nightly. 10.Metoprolol XL 100 mg nightly. 11.Norvasc 10 mg nightly. 12.NovoLog 3-20 units sliding scale b.i.d. with meals. 13.Phenergan 1 tablet twice daily as needed. 14.PhosLo 3-4 tablets by mouth 3 times daily with meals. 15.Chronic prednisone 5 mg daily. 16.Prograf 1 mg 3 capsules b.i.d. 17.Protonix 40 mg daily. 18.Rena-Vite 1 tablet nightly.  ALLERGIES:  The patient has allergies to: 1. MORPHINE. 2. TRAMADOL. 3. CHLORHEXIDINE. 4. METRONIDAZOLE. 5. HYDROCODONE. 6. PENICILLIN. 7. ZOLOFT.  SOCIAL HISTORY:  The patient is single.  She has 1 child.  She is disabled.  She lives in [place].  She has a college degree in business.  She denies alcohol, tobacco, and drug use.  FAMILY HISTORY:  Her mother died of sepsis at 87 years of age.  She had a history of diabetes mellitus, end-stage renal disease, and was an amputee.  Her father is 23 years of age and is relatively healthy.  REVIEW OF SYSTEMS:  As above in the history of present illness. Otherwise, review of systems is negative.  PHYSICAL EXAMINATION:  VITAL SIGNS:  Temperature 98.5, blood pressure 160/75, pulse 85, respiratory rate 18, and oxygen saturation 100% on nasal cannula oxygen. GENERAL:  The patient is a pleasant, obese, 40 year old [place] woman who is currently sitting up in bed, in no acute distress.  She is status post right IJ central venous catheter placement by Interventional Radiology. HEENT:  Head is normocephalic and nontraumatic.  Pupils are equal, round, and reactive  to light.  Extraocular muscles are intact. Conjunctivae are clear.  Sclerae are muddy.  Tympanic membranes not examined.  Nasal mucosa is dry.  No sinus tenderness.  Oropharynx reveals mildly dry mucous membranes.  No posterior exudates or erythema. NECK:  Supple.  No adenopathy, no thyromegaly, no bruit, no JVD. LUNGS:  Bilateral wheezes with occasional crackles auscultated bilaterally.  Breathing is nonlabored. HEART:  S1 and S2 with a soft systolic murmur and mild tachycardia. ABDOMEN:  Obese, positive bowel sounds, soft, nontender, nondistended. No hepatosplenomegaly, no masses palpated.  Well-healed abdominal scar is noted. GU AND RECTAL:  Deferred. EXTREMITIES:  Pedal pulses are palpable bilaterally.  No pretibial edema and no pedal edema. NEUROLOGIC:  The patient is alert and oriented x3.  Cranial nerves II- XII are intact.  Strength is 5/5 throughout.  Sensation is mildly decreased on the plantar surfaces.  ADMISSION LABORATORIES:  As above in the history of present illness.  In addition, sodium 122,  potassium 5.4, chloride 80, CO2 of 26, glucose 1020, BUN 25, creatinine 6.07, bilirubin 0.4, alkaline phosphatase 113, SGOT 27, SGPT 18, total protein 7.1, albumin 3.6, calcium 7.8, lactic acid 2.1, WBC 7.3, hemoglobin 8.7, hematocrit 28.8, MCV 100.3, and platelet count 222.  ASSESSMENT: 1. Shortness of breath with residual ongoing acute bronchitis.  The     patient was recently discharged for bronchitic pneumonia less than     1 week ago.  She completed Avelox.  She is currently afebrile and     her white blood cell count is within normal limits.  She was     initially hypoxic, but it is unclear if this was a mistake or not.     Her oxygen saturation quickly improved on just 2 liters of nasal     cannula oxygen. 2. Type 1 diabetes mellitus with nonketotic hyperosmolar     hyperglycemia.  Her venous glucose is greater than 1000.  Her anion     gap is 16, however, I do not  believe she is in overt DKA.  Her CO2     is well within normal limits.  There is probably an element of     noncompliance on the patient's part.  Also, she may simply need     more aggressive treatment of her uncontrolled diabetes.  Her     hemoglobin A1c was noted to be 12.8 in June. 3. Hyponatremia secondary to hyperosmolar hyperglycemia. 4. Hyperkalemia secondary to hyperglycemia and end-stage renal     disease. 5. End stage renal disease with a BUN of 25 and a creatinine of 6.07. 6. Anemia of chronic disease.  Her hemoglobin is 8.7.  It is     macrocytic with MCV of 100.3.  Vitamin B12 and folate will be     evaluated. 7. Hypertension. 8. Immunosuppressed due to Prograf, CellCept, and prednisone for her     transplant. 9. Nausea and vomiting.  Etiology is unknown at this time.  Her     abdomen reveals no tenderness upon palpation.  PLAN: 1. The emergency department physician ordered a PICC line, however,     the interventional radiologist decided to put in a right IJ central     line. 2. We will start a non-DKA insulin drip. 3. We will start gentle IV hydration with normal saline of 50 mL an     hour x1 liter. 4. I have already consulted nephrologist, Dr. Caryn Section for pending dialysis     in a day or two. 5. We will continue antibiotic therapy with IV Avelox in the setting     of increasing steroid treatment with Solu-Medrol at 80 mg q.12 h.     We will hold prednisone for now.  This should be restarted      after Solumedrol.  We will also add q.4 h. nebulizations     with albuterol and Atrovent. 6. We will check an amylase and lipase for evaluation of nausea and     vomiting.  Of note, her abdominal exam is benign.  We will hold     aspirin due to her report of mild hematemesis and/or mild     hemoptysis.  We will start IV Protonix. 7. We will check a vitamin B12 and folate level. 8. We will decrease the dose of metoprolol due to the bronchospasms.     We will hold lisinopril  until her serum potassium normalizes.     Elliot Cousin, M.D.     DF/MEDQ  D:  02/18/2011  T:  02/18/2011  Job:  [id number]  cc:   Candyce Churn. Allyne Gee, M.D. [facility] [place] [facility]. Eliott Nine, M.D.  Electronically Signed by Elliot Cousin M.D. on 02/22/2011 11:53:45 PM

## 2011-02-23 ENCOUNTER — Inpatient Hospital Stay (HOSPITAL_COMMUNITY): Payer: Medicare Other

## 2011-02-23 LAB — BASIC METABOLIC PANEL
CO2: 30 mEq/L (ref 19–32)
Chloride: 95 mEq/L — ABNORMAL LOW (ref 96–112)
Glucose, Bld: 132 mg/dL — ABNORMAL HIGH (ref 70–99)
Potassium: 3.4 mEq/L — ABNORMAL LOW (ref 3.5–5.1)
Sodium: 135 mEq/L (ref 135–145)

## 2011-02-23 LAB — GLUCOSE, CAPILLARY
Glucose-Capillary: 96 mg/dL (ref 70–99)
Glucose-Capillary: 218 mg/dL — ABNORMAL HIGH (ref 70–99)
Glucose-Capillary: 375 mg/dL — ABNORMAL HIGH (ref 70–99)
Glucose-Capillary: 271 mg/dL — ABNORMAL HIGH (ref 70–99)
Glucose-Capillary: 123 mg/dL — ABNORMAL HIGH (ref 70–99)
Glucose-Capillary: 103 mg/dL — ABNORMAL HIGH (ref 70–99)

## 2011-02-24 ENCOUNTER — Inpatient Hospital Stay (HOSPITAL_COMMUNITY): Payer: Medicare Other

## 2011-02-24 LAB — RENAL FUNCTION PANEL
Albumin: 2.9 g/dL — ABNORMAL LOW (ref 3.5–5.2)
Calcium: 7.4 mg/dL — ABNORMAL LOW (ref 8.4–10.5)
GFR calc Af Amer: 10 mL/min — ABNORMAL LOW (ref >60)
Glucose, Bld: 529 mg/dL — ABNORMAL HIGH (ref 70–99)
Phosphorus: 1.5 mg/dL — ABNORMAL LOW (ref 2.3–4.6)
Sodium: 131 mEq/L — ABNORMAL LOW (ref 135–145)

## 2011-02-24 LAB — GLUCOSE, CAPILLARY
Glucose-Capillary: 162 mg/dL — ABNORMAL HIGH (ref 70–99)
Glucose-Capillary: 34 mg/dL — CL (ref 70–99)
Glucose-Capillary: 518 mg/dL — ABNORMAL HIGH (ref 70–99)
Glucose-Capillary: 72 mg/dL (ref 70–99)

## 2011-02-24 LAB — CBC
HCT: 26.9 % — ABNORMAL LOW (ref 36.0–46.0)
MCHC: 31.2 g/dL (ref 30.0–36.0)
Platelets: 222 10*3/uL (ref 150–400)
RDW: 15.5 % (ref 11.5–15.5)

## 2011-02-24 LAB — CULTURE, BLOOD (ROUTINE X 2)

## 2011-02-25 LAB — GLUCOSE, CAPILLARY
Glucose-Capillary: 291 mg/dL — ABNORMAL HIGH (ref 70–99)
Glucose-Capillary: 58 mg/dL — ABNORMAL LOW (ref 70–99)
Glucose-Capillary: 172 mg/dL — ABNORMAL HIGH (ref 70–99)

## 2011-02-26 LAB — RENAL FUNCTION PANEL
Albumin: 3.3 — ABNORMAL LOW
Albumin: 3.4 — ABNORMAL LOW
BUN: 95 — ABNORMAL HIGH
CO2: 21
Calcium: 7 — ABNORMAL LOW
Chloride: 101
Chloride: 98
GFR calc Af Amer: 4 — ABNORMAL LOW
GFR calc Af Amer: 5 — ABNORMAL LOW
GFR calc non Af Amer: 2 — ABNORMAL LOW
GFR calc non Af Amer: 3 — ABNORMAL LOW
GFR calc non Af Amer: 4 — ABNORMAL LOW
Glucose, Bld: 114 — ABNORMAL HIGH
Glucose, Bld: 163 — ABNORMAL HIGH
Phosphorus: 7.1 — ABNORMAL HIGH
Phosphorus: 9.1 — ABNORMAL HIGH
Potassium: 4.2
Sodium: 135
Sodium: 136
Sodium: 137

## 2011-02-26 LAB — POCT I-STAT, CHEM 8
Calcium, Ion: 0.8 — ABNORMAL LOW
Creatinine, Ser: 17.1 — ABNORMAL HIGH
Glucose, Bld: 98
Hemoglobin: 16.3 — ABNORMAL HIGH
Potassium: 5.3 — ABNORMAL HIGH
TCO2: 15

## 2011-02-26 LAB — CBC
HCT: 39.8
HCT: 44.8
Hemoglobin: 11.9 — ABNORMAL LOW
Hemoglobin: 12
MCHC: 32.2
MCV: 101.7 — ABNORMAL HIGH
MCV: 102.1 — ABNORMAL HIGH
Platelets: 170
RBC: 3.62 — ABNORMAL LOW
RBC: 4.41
RDW: 15.8 — ABNORMAL HIGH
RDW: 15.9 — ABNORMAL HIGH
WBC: 10.2
WBC: 4.8
WBC: 6.4

## 2011-02-26 LAB — GLUCOSE, CAPILLARY
Glucose-Capillary: 100 — ABNORMAL HIGH
Glucose-Capillary: 108 — ABNORMAL HIGH
Glucose-Capillary: 169 — ABNORMAL HIGH
Glucose-Capillary: 174 mg/dL — ABNORMAL HIGH (ref 70–99)
Glucose-Capillary: 212 — ABNORMAL HIGH
Glucose-Capillary: 42 mg/dL — CL (ref 70–99)
Glucose-Capillary: 73 mg/dL (ref 70–99)
Glucose-Capillary: 80 mg/dL (ref 70–99)
Glucose-Capillary: 87 mg/dL (ref 70–99)

## 2011-02-26 LAB — DIFFERENTIAL
Eosinophils Absolute: 0.1
Eosinophils Relative: 1
Lymphs Abs: 0.8
Monocytes Absolute: 0.4
Monocytes Relative: 3

## 2011-02-26 LAB — HEMOGLOBIN A1C: Mean Plasma Glucose: 105

## 2011-02-26 LAB — CULTURE, BLOOD (ROUTINE X 2)
Culture: NO GROWTH
Culture: NO GROWTH

## 2011-02-27 ENCOUNTER — Inpatient Hospital Stay (HOSPITAL_COMMUNITY): Payer: Medicare Other

## 2011-02-27 LAB — GLUCOSE, CAPILLARY
Glucose-Capillary: 122 mg/dL — ABNORMAL HIGH (ref 70–99)
Glucose-Capillary: 111 mg/dL — ABNORMAL HIGH (ref 70–99)
Glucose-Capillary: 59 mg/dL — ABNORMAL LOW (ref 70–99)
Glucose-Capillary: 52 mg/dL — ABNORMAL LOW (ref 70–99)
Glucose-Capillary: 387 mg/dL — ABNORMAL HIGH (ref 70–99)

## 2011-02-27 LAB — RENAL FUNCTION PANEL
CO2: 24 mEq/L (ref 19–32)
Chloride: 98 mEq/L (ref 96–112)
GFR calc Af Amer: 5 mL/min — ABNORMAL LOW (ref >90)
Glucose, Bld: 139 mg/dL — ABNORMAL HIGH (ref 70–99)
Phosphorus: 2.8 mg/dL (ref 2.3–4.6)
Potassium: 3.9 mEq/L (ref 3.5–5.1)
Sodium: 136 mEq/L (ref 135–145)

## 2011-02-27 LAB — CBC
Hemoglobin: 7.9 g/dL — ABNORMAL LOW (ref 12.0–15.0)
RBC: 2.53 MIL/uL — ABNORMAL LOW (ref 3.87–5.11)
WBC: 7.7 10*3/uL (ref 4.0–10.5)

## 2011-02-27 MED ORDER — XENON XE 133 GAS
10.0000 | GAS_FOR_INHALATION | Freq: Once | RESPIRATORY_TRACT | Status: AC | PRN
  Administered 2011-02-27: 09:00:00 10 via RESPIRATORY_TRACT

## 2011-02-27 MED ORDER — TECHNETIUM TO 99M ALBUMIN AGGREGATED
6.0000 | Freq: Once | INTRAVENOUS | Status: AC | PRN
  Administered 2011-02-27: 6 via INTRAVENOUS

## 2011-02-28 DIAGNOSIS — M7989 Other specified soft tissue disorders: Secondary | ICD-10-CM

## 2011-02-28 DIAGNOSIS — M79609 Pain in unspecified limb: Secondary | ICD-10-CM

## 2011-02-28 LAB — CBC
HCT: 26.6 % — ABNORMAL LOW (ref 36.0–46.0)
Hemoglobin: 8.3 g/dL — ABNORMAL LOW (ref 12.0–15.0)
MCV: 99.6 fL (ref 78.0–100.0)
WBC: 4.4 10*3/uL (ref 4.0–10.5)

## 2011-02-28 LAB — GLUCOSE, CAPILLARY
Glucose-Capillary: 169 mg/dL — ABNORMAL HIGH (ref 70–99)
Glucose-Capillary: 349 mg/dL — ABNORMAL HIGH (ref 70–99)

## 2011-03-01 ENCOUNTER — Inpatient Hospital Stay (HOSPITAL_COMMUNITY): Payer: Medicare Other

## 2011-03-01 LAB — GLUCOSE, CAPILLARY
Glucose-Capillary: 157 mg/dL — ABNORMAL HIGH (ref 70–99)
Glucose-Capillary: 123 mg/dL — ABNORMAL HIGH (ref 70–99)
Glucose-Capillary: 131 mg/dL — ABNORMAL HIGH (ref 70–99)
Glucose-Capillary: 135 mg/dL — ABNORMAL HIGH (ref 70–99)
Glucose-Capillary: 193 mg/dL — ABNORMAL HIGH (ref 70–99)
Glucose-Capillary: 358 mg/dL — ABNORMAL HIGH (ref 70–99)
Glucose-Capillary: 355 mg/dL — ABNORMAL HIGH (ref 70–99)

## 2011-03-01 LAB — CBC
HCT: 39.9 % (ref 36.0–46.0)
Hemoglobin: 13.1 g/dL (ref 12.0–15.0)
MCHC: 32 g/dL (ref 30.0–36.0)
MCV: 98.1 fL (ref 78.0–100.0)
MCV: 99 fL (ref 78.0–100.0)
MCV: 99.2 fL (ref 78.0–100.0)
MCV: 99.9 fL (ref 78.0–100.0)
Platelets: 184 10*3/uL (ref 150–400)
Platelets: 143 10*3/uL — ABNORMAL LOW (ref 150–400)
Platelets: 270 10*3/uL (ref 150–400)
Platelets: 274 10*3/uL (ref 150–400)
Platelets: UNDETERMINED 10*3/uL (ref 150–400)
RDW: 16.2 % — ABNORMAL HIGH (ref 11.5–15.5)
RDW: 17 % — ABNORMAL HIGH (ref 11.5–15.5)
WBC: 4.9 10*3/uL (ref 4.0–10.5)
WBC: 5.6 10*3/uL (ref 4.0–10.5)
WBC: 6.8 10*3/uL (ref 4.0–10.5)
WBC: 8.5 10*3/uL (ref 4.0–10.5)

## 2011-03-01 LAB — COMPREHENSIVE METABOLIC PANEL
BUN: 76 mg/dL — ABNORMAL HIGH (ref 6–23)
CO2: 17 mEq/L — ABNORMAL LOW (ref 19–32)
Calcium: 7.6 mg/dL — ABNORMAL LOW (ref 8.4–10.5)
Creatinine, Ser: 14.49 mg/dL — ABNORMAL HIGH (ref 0.4–1.2)
GFR calc non Af Amer: 3 mL/min — ABNORMAL LOW (ref >60)
Glucose, Bld: 106 mg/dL — ABNORMAL HIGH (ref 70–99)

## 2011-03-01 LAB — RENAL FUNCTION PANEL
Albumin: 3.1 g/dL — ABNORMAL LOW (ref 3.5–5.2)
Albumin: 3.1 g/dL — ABNORMAL LOW (ref 3.5–5.2)
CO2: 25 mEq/L (ref 19–32)
Calcium: 6.9 mg/dL — ABNORMAL LOW (ref 8.4–10.5)
Chloride: 99 mEq/L (ref 96–112)
Chloride: 100 mEq/L (ref 96–112)
Chloride: 94 mEq/L — ABNORMAL LOW (ref 96–112)
Creatinine, Ser: 7.23 mg/dL — ABNORMAL HIGH (ref 0.50–1.10)
Creatinine, Ser: 10.56 mg/dL — ABNORMAL HIGH (ref 0.4–1.2)
Creatinine, Ser: 8.86 mg/dL — ABNORMAL HIGH (ref 0.4–1.2)
GFR calc Af Amer: 7 mL/min — ABNORMAL LOW (ref >90)
GFR calc Af Amer: 5 mL/min — ABNORMAL LOW (ref >60)
GFR calc Af Amer: 6 mL/min — ABNORMAL LOW (ref >60)
GFR calc non Af Amer: 6 mL/min — ABNORMAL LOW (ref >90)
GFR calc non Af Amer: 4 mL/min — ABNORMAL LOW (ref >60)
GFR calc non Af Amer: 5 mL/min — ABNORMAL LOW (ref >60)
Glucose, Bld: 98 mg/dL (ref 70–99)
Phosphorus: 8.7 mg/dL — ABNORMAL HIGH (ref 2.3–4.6)
Potassium: 3.8 mEq/L (ref 3.5–5.1)
Sodium: 141 mEq/L (ref 135–145)
Sodium: 134 mEq/L — ABNORMAL LOW (ref 135–145)

## 2011-03-01 LAB — CULTURE, BLOOD (ROUTINE X 2)

## 2011-03-01 LAB — DIFFERENTIAL
Basophils Relative: 0 % (ref 0–1)
Eosinophils Absolute: 0.1 10*3/uL (ref 0.0–0.7)
Eosinophils Absolute: 0.3 10*3/uL (ref 0.0–0.7)
Lymphs Abs: 0.6 10*3/uL — ABNORMAL LOW (ref 0.7–4.0)
Monocytes Relative: 4 % (ref 3–12)
Neutrophils Relative %: 81 % — ABNORMAL HIGH (ref 43–77)
Neutrophils Relative %: 82 % — ABNORMAL HIGH (ref 43–77)

## 2011-03-01 LAB — LIPASE, BLOOD: Lipase: 53 U/L (ref 11–59)

## 2011-03-01 LAB — POCT I-STAT, CHEM 8
Creatinine, Ser: 15.5 mg/dL — ABNORMAL HIGH (ref 0.4–1.2)
HCT: 45 % (ref 36.0–46.0)
Hemoglobin: 15.3 g/dL — ABNORMAL HIGH (ref 12.0–15.0)
Potassium: 6.7 mEq/L (ref 3.5–5.1)
Sodium: 134 mEq/L — ABNORMAL LOW (ref 135–145)

## 2011-03-02 LAB — GLUCOSE, RANDOM: Glucose, Bld: 465 mg/dL — ABNORMAL HIGH (ref 70–99)

## 2011-03-02 LAB — GLUCOSE, CAPILLARY
Glucose-Capillary: 82 mg/dL (ref 70–99)
Glucose-Capillary: 98 mg/dL (ref 70–99)
Glucose-Capillary: 409 mg/dL — ABNORMAL HIGH (ref 70–99)

## 2011-03-03 ENCOUNTER — Inpatient Hospital Stay (HOSPITAL_COMMUNITY): Payer: Medicare Other

## 2011-03-03 LAB — CBC
Hemoglobin: 8.8 g/dL — ABNORMAL LOW (ref 12.0–15.0)
MCV: 99.3 fL (ref 78.0–100.0)
Platelets: 226 10*3/uL (ref 150–400)
RBC: 2.9 MIL/uL — ABNORMAL LOW (ref 3.87–5.11)
WBC: 5.9 10*3/uL (ref 4.0–10.5)

## 2011-03-03 LAB — BASIC METABOLIC PANEL
CO2: 28 mEq/L (ref 19–32)
Calcium: 8.1 mg/dL — ABNORMAL LOW (ref 8.4–10.5)
Chloride: 93 mEq/L — ABNORMAL LOW (ref 96–112)
Glucose, Bld: 330 mg/dL — ABNORMAL HIGH (ref 70–99)
Potassium: 4.5 mEq/L (ref 3.5–5.1)
Sodium: 134 mEq/L — ABNORMAL LOW (ref 135–145)

## 2011-03-03 LAB — GLUCOSE, CAPILLARY
Glucose-Capillary: 118 mg/dL — ABNORMAL HIGH (ref 70–99)
Glucose-Capillary: 177 mg/dL — ABNORMAL HIGH (ref 70–99)

## 2011-03-03 LAB — IRON AND TIBC
Iron: 46 ug/dL (ref 42–135)
Saturation Ratios: 25 % (ref 20–55)

## 2011-03-04 LAB — GLUCOSE, CAPILLARY
Glucose-Capillary: 205 mg/dL — ABNORMAL HIGH (ref 70–99)
Glucose-Capillary: 44 mg/dL — CL (ref 70–99)
Glucose-Capillary: 103 mg/dL — ABNORMAL HIGH (ref 70–99)

## 2011-03-04 LAB — BASIC METABOLIC PANEL
BUN: 15 mg/dL (ref 6–23)
CO2: 29 mEq/L (ref 19–32)
Chloride: 95 mEq/L — ABNORMAL LOW (ref 96–112)
Creatinine, Ser: 4.8 mg/dL — ABNORMAL HIGH (ref 0.50–1.10)
Glucose, Bld: 225 mg/dL — ABNORMAL HIGH (ref 70–99)

## 2011-03-05 LAB — CULTURE, BLOOD (ROUTINE X 2): Culture: NO GROWTH

## 2011-03-05 LAB — BASIC METABOLIC PANEL
CO2: 26 mEq/L (ref 19–32)
Calcium: 8.6 mg/dL (ref 8.4–10.5)
Calcium: 8.3 mg/dL — ABNORMAL LOW (ref 8.4–10.5)
Creatinine, Ser: 8.29 mg/dL — ABNORMAL HIGH (ref 0.50–1.10)
GFR calc non Af Amer: 6 mL/min — ABNORMAL LOW (ref >90)
Glucose, Bld: 61 mg/dL — ABNORMAL LOW (ref 70–99)
Glucose, Bld: 355 mg/dL — ABNORMAL HIGH (ref 70–99)
Sodium: 138 mEq/L (ref 135–145)

## 2011-03-05 LAB — GLUCOSE, CAPILLARY: Glucose-Capillary: 184 mg/dL — ABNORMAL HIGH (ref 70–99)

## 2011-03-06 ENCOUNTER — Inpatient Hospital Stay (HOSPITAL_COMMUNITY): Payer: Medicare Other

## 2011-03-06 LAB — RENAL FUNCTION PANEL
BUN: 34 mg/dL — ABNORMAL HIGH (ref 6–23)
CO2: 26 mEq/L (ref 19–32)
Chloride: 96 mEq/L (ref 96–112)
Creatinine, Ser: 9.62 mg/dL — ABNORMAL HIGH (ref 0.50–1.10)

## 2011-03-06 LAB — GLUCOSE, CAPILLARY
Glucose-Capillary: 142 mg/dL — ABNORMAL HIGH (ref 70–99)
Glucose-Capillary: 474 mg/dL — ABNORMAL HIGH (ref 70–99)

## 2011-03-06 NOTE — Discharge Summary (Signed)
  [name], GRAZIOSI NO.:  [id number]  MEDICAL RECORD NO.:  [id number]  LOCATION:  6526                         FACILITY:  [facility]  PHYSICIAN:  Mauro Kaufmann, MD         DATE OF BIRTH:  10/28/1970  DATE OF ADMISSION:  02/07/2011 DATE OF DISCHARGE:                              DISCHARGE SUMMARY   ADDENDUM  BRIEF HOSPITAL COURSE: 1. Diabetes mellitus.  The patient has been getting the concentrated     fruits in the fruit tray which has been driving the blood sugars     very high.  The patient has been counseled to avoid the canned     fruit and other fruits which can raise the blood sugar and at this     time the patient understands and says that she has been taking good     care of her diet at home.  Anyway the patient will continue to use     Lantus at home and follow up with her primary care provider. 2. Acute bronchitis.  The patient still has stuffy nose and mild upper     respiratory infection which is improving with the Avelox.  The     patient should be continued on Avelox for 4 more days.  MEDICATIONS ON DISCHARGE: 1. Lisinopril 10 mg p.o. daily at bedtime. 2. Moxifloxacin 400 mg p.o. daily for 4 days. 3. Albuterol inhaler 2 puffs inhale every 4 hours as needed. 4. Ambien 10 mg p.o. daily at bedtime. 5. Aspirin enteric coated 81 mg p.o. daily. 6. CellCept 500 mg 1 tablet p.o. twice daily. 7. Clonazepam 1 mg tab 2 tablets daily at bedtime. 8. Diphenhydramine 25 mg 2-3 tablets p.o. twice daily. 9. Flexeril 10 mg p.o. daily at bedtime. 10.Lantus 10 units subcutaneously daily at bedtime. 11.Metoprolol XL 100 mg 1 tablet p.o. daily at bedtime. 12.Norvasc 10 mg p.o. daily at bedtime. 13.NovoLog 3 to 20 units twice daily with meals. 14.Phenergan 1 tablet p.o. twice daily. 15.PhosLo 3-4 tablets by mouth 3 times a day with meals. 16.Prednisone 5 mg tablet p.o. daily. 17.Prograf 1 mg 3 capsules p.o. by mouth twice daily. 18.Protonix 40 mg p.o.  daily. 19.Renal vitamin 1 tablet p.o. daily at bedtime.  Follow up with Dr. Allyne Gee in 1 week and follow up with [facility] and hemodialysis as outpatient.     Mauro Kaufmann, MD     GL/MEDQ  D:  02/13/2011  T:  02/14/2011  Job:  [id number]  Electronically Signed by Sibyl Parr LAMA  on 03/06/2011 09:47:07 AM

## 2011-03-06 NOTE — Discharge Summary (Signed)
  NAMETOVAH, [name] NO.:  [id number]  MEDICAL RECORD NO.:  [id number]  LOCATION:  6526                         FACILITY:  [facility]  PHYSICIAN:  Mauro Kaufmann, MD         DATE OF BIRTH:  15-Jul-1970  DATE OF ADMISSION:  02/07/2011 DATE OF DISCHARGE:  02/14/2011                              DISCHARGE SUMMARY   ADMISSION DIAGNOSES: 1. Health care associated pneumonia. 2. Hyperosmolar hyperglycemia. 3. Hypertension. 4. End-stage renal disease. 5. Gastroesophageal reflux disease.  DISCHARGE DIAGNOSES: 1. Pneumonia, resolved and currently on Avelox for 4 more days. 2. Hyperosmolar hyperglycemia.  The patient has poorly controlled     blood sugar because of the noncompliance with diet. 3. Hypertension. 4. End-stage renal disease. 5. Gastroesophageal reflux disease.  Tests performed during the hospital stay include chest x-ray on February 07, 2011, showed bilateral lower lobe predominant airspace disease most compatible with pneumonia.  If there is no clinical evidence for acute infection, pulmonary edema with atelectasis less likely consideration.  Repeat chest x-ray on February 10, 2011, showed enlargement of cardiac silhouette, improved pulmonary infiltrate.  Consults obtained in hospital include Nephrology consultation.  BRIEF HISTORY AND PHYSICAL:  A 40 year old female with a history of end- stage renal disease who is status post transplant back in 2004, came to the hospital having shortness of breath.  The patient undergoes hemodialysis Tuesday, Thursday, and Saturdays.  The chest x-ray shows pneumonia versus pulmonary edema.  The patient was started on antibiotics vancomycin and Levaquin, and also cefepime.  For the hyperosmolar hyperglycemia.  The patient was started on the glucose stabilizer.  BRIEF HOSPITAL COURSE: 1. Pneumonia.  The patient has picture more compatible with pulmonary     edema than pneumonia.  The patient was given 5 days of IV  antibiotics and switched to p.o. Avelox.  At this time, the patient     has no signs and symptoms of infection.  She has been afebrile.     Blood pressure has been stable.  She should continue to take 4 more     days of Avelox at home. 2. Pulmonary edema.  The patient has picture more compatible with     pulmonary edema.  Chest x-ray on February 10, 2011, shows no     pneumonia but only showed improved pulmonary infiltrates.  The     patient had     dramatic improvement after the hemodialysis.  She will continue the     hemodialysis as an outpatient. 3. Diabetes mellitus.  The patient has poorly controlled diabetes     mellitus, and this is because of noncompliance with diet.     Mauro Kaufmann, MD     GL/MEDQ  D:  02/13/2011  T:  02/14/2011  Job:  [id number]  Electronically Signed by Mauro Kaufmann  on 03/06/2011 09:47:15 AM

## 2011-03-07 LAB — BASIC METABOLIC PANEL
GFR calc Af Amer: 10 mL/min — ABNORMAL LOW (ref >90)
GFR calc non Af Amer: 9 mL/min — ABNORMAL LOW (ref >90)
Potassium: 4.7 mEq/L (ref 3.5–5.1)
Sodium: 137 mEq/L (ref 135–145)

## 2011-03-07 LAB — GLUCOSE, CAPILLARY: Glucose-Capillary: 386 mg/dL — ABNORMAL HIGH (ref 70–99)

## 2011-03-07 LAB — PTH, INTACT AND CALCIUM: PTH: 123.2 pg/mL — ABNORMAL HIGH (ref 14.0–72.0)

## 2011-03-08 ENCOUNTER — Inpatient Hospital Stay (HOSPITAL_COMMUNITY): Payer: Medicare Other

## 2011-03-08 LAB — CBC
HCT: 27.4 % — ABNORMAL LOW (ref 36.0–46.0)
MCH: 31.1 pg (ref 26.0–34.0)
MCV: 101.5 fL — ABNORMAL HIGH (ref 78.0–100.0)
RBC: 2.7 MIL/uL — ABNORMAL LOW (ref 3.87–5.11)
WBC: 5.6 10*3/uL (ref 4.0–10.5)

## 2011-03-08 LAB — RENAL FUNCTION PANEL
BUN: 30 mg/dL — ABNORMAL HIGH (ref 6–23)
CO2: 27 mEq/L (ref 19–32)
Calcium: 8.6 mg/dL (ref 8.4–10.5)
Chloride: 99 mEq/L (ref 96–112)
Creatinine, Ser: 8.51 mg/dL — ABNORMAL HIGH (ref 0.50–1.10)
Glucose, Bld: 128 mg/dL — ABNORMAL HIGH (ref 70–99)

## 2011-03-08 LAB — GLUCOSE, CAPILLARY: Glucose-Capillary: 168 mg/dL — ABNORMAL HIGH (ref 70–99)

## 2011-03-08 NOTE — Progress Notes (Signed)
NAMEJENI, DULING NO.:  [id number]  MEDICAL RECORD NO.:  [id number]  LOCATION:  6525                         FACILITY:  [facility]  PHYSICIAN:  Zannie Cove, MD     DATE OF BIRTH:  [birth year]                                PROGRESS NOTE   PRIMARY CARE PHYSICIAN: Robyn N. Allyne Gee, MD  NEPHROLOGIST: [facility] Salvia. Eliott Nine, MD  CURRENT DIAGNOSES: 1. Hyperosmolar nonketotic hyperglycemia. 2. Brittle diabetes with significant fluctuations in CBGs. 3. Recent pneumonia with persistent cough. 4. Pleuritic chest pain. 5. End-stage renal disease on hemodialysis Tuesday, Thursday, Saturday     with volume overload. 6. Fever today. 7. 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 8. Anemia of chronic disease. 9. Secondary hyperparathyroidism. 10.Gastroesophageal reflux disease. 11.Hypertension. 12.Dyslipidemia. 13.History of steel syndrome in August 2001 in the right upper     extremity.  CONSULTANTS:Cynthia B. Eliott Nine, MD and associates with [facility].  DIAGNOSTIC INVESTIGATIONS: 1. Chest x-ray on February 18, 2011, shows near complete resolution     of previously identified pneumonia.  No new abnormalities. 2. Right IJ venous catheter placement under fluoroscopy for poor     access. 3. Repeat chest x-ray on February 24, 2011, cardiomegaly without     edema.  Bronchitic-type changes.  Chest x-ray this morning and     overnight showed mild pulmonary edema. 4. VQ scan shows very low likelihood of PE.  HOSPITAL COURSE: [name] is a 40 year old female with history of brittle type 1 diabetes, end-stage renal disease on hemodialysis, and a failed kidney pancreas transplant who was admitted to the hospital with shortness of breath, wheezing, nausea, and vomiting.  On evaluation, she was found to have hyperosmolar nonketotic hyperglycemia as well as concern for ongoing bronchitis. 1. Hyperosmolar nonketotic hyperglycemia,  initially treated with     Glucommander protocol and then switched to Lantus; however, has     been having significant fluctuations in her blood sugars and is     very sensitive to NovoLog stacking and unfortunately several days     could not get Lantus because of hypoglycemic episodes and the next     day her sugars were drawn in the 500s.  At this time, I had to stop     her Nepro because this was causing significant hypoglycemia too and     we will decrease her Lantus to 10 units given NovoLog with meals     and a very sensitive customized sliding-scale.  She will need     followup with Endocrinology at discharge. 2. Pleuritic chest pain likely secondary to recent bronchitis and     pneumonia.  X-ray changes completely resolved.  Was treated with 2     more days of antibiotics in the hospital, which was subsequently     discontinued.  Overnight had some pleuritic chest pain, which I     suspect is secondary to intercostal muscle strain from coughing.     VQ scan is negative and chest x-ray is negative.  Use Percocet     p.r.n. 3. She is febrile today with a low-grade temperature.  We will check  venous Dopplers and check blood cultures.  Hold off on antibiotics     for now.  Get CBC in the morning. 4. Failed kidney pancreas transplant.  Dr. Kathrene Bongo discussed with     case with Dr. Resa Miner at [facility] recommended that they     could decrease her prednisone and CellCept doses as well as Prograf     hence her current doses of Prograf and CellCept are lower than her     previous doses and her prednisone is also lowered.  Further weaning     and tapering of immunosuppressants can be done per Nephrology as an     outpatient. 5. Anemia of chronic disease.  Continued on Procrit.  Rest of her chronic medical problems remained stable.     Zannie Cove, MD     PJ/MEDQ  D:  02/27/2011  T:  02/27/2011  Job:  [id number]  Electronically Signed by Zannie Cove  on  03/08/2011 01:52:50 PM

## 2011-03-08 NOTE — Discharge Summary (Signed)
NAMENATASSJA, OLLIS NO.:  [id number]  MEDICAL RECORD NO.:  [id number]  LOCATION:  6732                         FACILITY:  [facility]  PHYSICIAN:  Isidor Holts, M.D.  DATE OF BIRTH:  Oct 03, 1970  DATE OF ADMISSION:  02/18/2011 DATE OF DISCHARGE:  03/08/2011                              DISCHARGE SUMMARY   PRIMARY MD:  Candyce Churn. Allyne Gee, MD  PRIMARY NEPHROLOGIST:  Duke Salvia. Eliott Nine, MD  DISCHARGE DIAGNOSES: 1. Severe uncontrolled type 1 diabetes mellitus. 2. Hyperosmolar state, complicating #1. 3. Brittle diabetes control/recurrent hypoglycemic episodes. 4. End-stage renal disease, on hemodialysis Tuesdays, Thursdays, and     Saturdays. 5. History of renal/pancreas transplant, complicated by rejection,     currently on immunosuppressive treatment. 6. Secondary hyperparathyroidism. 7. Anemia of chronic disease. 8. Gastroesophageal reflux disease. 9. Hypertension. 10.Dyslipidemia.  DISCHARGE MEDICATIONS: 1. Ferric gluconate 125 mg IV on Tuesdays, Thursdays, and Saturdays at     12 noon. 2. NovoLog insulin via FlexPen syringe 48 units subcutaneously t.i.d.     with meals per sliding scale as follows.  For CBG 70-170, 4 units,     CBG 171-240, 5 units, CBG 241-310, 6 units, CBG 211-280, 7 units,     CBG over 280, 8 units. 3. Oxycodone IR 5 mg 1 p.o. p.r.n. q.4 h. for pain, a total of 90     pills have been dispensed. 4. CellCept 500 mg p.o. q.a.m. and 250 mg p.o. q. evening. 5. Lantus insulin 7 units subcutaneously b.i.d. 6. Tessalon Perles 100 mg p.o. b.i.d. p.r.n. for cough. 7. Albuterol inhaler 2 puffs p.r.n. q.4 h. for shortness of breath. 8. Ambien 10 mg p.o. at bedtime. 9. Enteric-coated aspirin 81 mg p.o. daily. 10.Clonazepam 2 mg p.o. at bedtime. 11.Diphenhydramine 50 mg p.o. b.i.d. 12.Flexeril 10 mg p.o. at bedtime. 13.Lisinopril 10 mg p.o. at bedtime. 14.Metoprolol XL succinate 100 mg p.o. at bedtime. 15.Norvasc 10 mg p.o. at  bedtime. 16.Phenergan 25 mg p.o. b.i.d. 17.PhosLo 667 mg 3 tablets p.o. t.i.d. with meals. 18.Prednisone 5 mg p.o. daily. 19.Protonix 40 mg p.o. daily. 20.Renal vitamin one tablet p.o. at bedtime.  Note: Avelox has been discontinued.  PROCEDURES: 1. Chest x-ray February 18, 2011.  This showed near complete     resolution of previously identified pneumonia in the lower lobes     with mild patchy airspace disease consistent with no new     abnormality, stable mild cardiomegaly, mild pulmonary venous     hypertension without overt edema. 2. Chest x-ray February 22, 2011.  This showed cardiomegaly without     edema, also bronchitic changes. 3. Chest x-ray February 27, 2011.  This showed mild pulmonary edema. 4. Ventilation-perfusion lung scan March 23, 2011.  This showed low-     likelihood ratio for pulmonary embolism.  The patient was     discharged on March 08, 2011.  CONSULTATIONS: 1. Duke Salvia Eliott Nine, MD, [facility]. 2. Tonita Cong, MD, endocrinologist.  ADMISSION HISTORY:  As in H and P notes of March 20, 2011, dictated by Dr. Elliot Cousin. 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  On initial evaluation, she was found to have glucose of 1020, sodium 122,  potassium 4.5.  Lactic acid was normal.  She was admitted for further evaluation, investigation, and management.  CLINICAL COURSE: 1. Hyperosmolar state/severe hyperglycemia.  The patient presented as     described above.  She was managed initially with intravenous     insulin infusion via glucose stabilizer protocol.  She had no     evidence of DKA.  With normalization of blood glucose, she was     transitioned to Lantus insulin and hyperosmolar state resolved.  2. Brittle type 1 diabetes mellitus.  The patient during the course of     hospitalization, has had wide fluctuations in her glycemia levels,     necessitating frequent adjustment of her insulin regimen.  These     medications have unfortunately been associated with recurrent     hypoglycemic episodes, however, she was managed with scheduled     Lantus insulin and sliding scale insulin coverage.  Dr. Sharl Ma,     endocrinologist was called on endocrinology consultation and he was     kind enough to formulate insulin regimen, which is calculated to     avoid hypoglycemia and keep the patient's glucose levels within     acceptable limits.  As a matter of fact on March 08, 2011, the     patient's CBGs were ranging between 124-168.  Dr. Sharl Ma has kindly     agreed to have the patient follow up with him on an outpatient     basis.  First appointment is scheduled for March 14, 2011, and he     has opted for a twice daily low-dose Lantus regimen.  3. End-stage renal disease.  The patient was evaluated by the renal     team during this hospitalization and continued her regular dialysis     sessions, under the care of Dr. Camille Bal.  4. Anemia of chronic disease.  The patient was managed with iron     supplementation and as of March 08, 2011, hemoglobin was 8.4 with     hematocrit of 27.4.  This is considered reasonable.  5. Hypertension.  The patient was managed with preadmission     antihypertensive medications and BP was  reasonably controlled     during this hospitalization.  6. GERD.  The patient was asymptomatic from this viewpoint.  DISPOSITION:  The patient was on March 08, 2011, asymptomatic.  There were no new issues.  She was considered clinically stable for discharge and discharged accordingly.   ACTIVITY:  As tolerated.  DIET:  Renal 60-2-2.  FOLLOWUP INSTRUCTIONS:  The patient will follow up routinely with her primary nephrologist, Dr. Camille Bal, per prior scheduled appointment.  She will continue her regular dialysis schedule on Tuesdays, Thursdays, and Saturdays.  In addition, the patient will follow up with her primary care physician, Dr. Dorothyann Peng in the coming week.  She has been instructed to call for an appointment and has verbalized understanding.  She will also follow up  with Dr. Talmage Coin, endocrinologist.  An appointment has been scheduled for March 14, 2011, at 11 a.m.  The patient has been supplied with appropriate details.     Isidor Holts, M.D.     CO/MEDQ  D:  03/08/2011  T:  03/08/2011  Job:  [id number]  cc:   Candyce Churn. Allyne Gee, M.D. Duke Salvia Eliott Nine, M.D. Tonita Cong, M.D.  Electronically Signed by Isidor Holts M.D. on 03/08/2011 06:55:08 PM

## 2011-03-08 NOTE — Consult Note (Signed)
[name], [name] NO.:  [id number]  MEDICAL RECORD NO.:  [id number]  LOCATION:  6732                         FACILITY:  [facility]  PHYSICIAN:  Tonita Cong, M.D.  DATE OF BIRTH:  [birth year]  DATE OF CONSULTATION:  03/07/2011 DATE OF DISCHARGE:                                CONSULTATION   REASON FOR CONSULTATION:  Uncontrolled type 1 diabetes mellitus.  REQUESTING PHYSICIAN:  Isidor Holts, MD, [facility].  HISTORY OF PRESENT ILLNESS:  [name] was diagnosed with type 1 diabetes mellitus as a child.  She received pancreas and kidney transplant in March 2004.  By February 2012, hyperglycemia worsened recently which led to this hospital admission.  She developed nausea with associated vomiting and anorexia just prior to the hospital stay.  She notes that she eats more food at home that she does here in the hospital which may have contributed to hypoglycemia after some of her meals here.  PAST MEDICAL HISTORY: 1. 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 2. Hypertension. 3. Gastroesophageal reflux disease. 4. Secondary hyperparathyroidism.  ALLERGIES AND MEDICATIONS LIST:  Reviewed including current medicines.  FAMILY MEDICAL HISTORY:  Diabetes mellitus in her maternal family and paternal family.  To the patient's knowledge, each of her family members with diabetes have type 2 diabetes mellitus.  To her knowledge, she is the only one in the family with type 1 diabetes mellitus.  SOCIAL HISTORY:  Recently moved to [place].  Previously under the care of an endocrinologist in her former town.  However, she has not received care from an endocrinologist recently since the pancreas transplant had been successful, at least for the first several years.  REVIEW OF SYSTEMS:  No blurry vision, weight loss, diarrhea, abdominal pain.  No anorexia, vomiting, or nausea  now.  No numbness or tingling in her feet.  No rashes or foot ulcers.  PHYSICAL EXAMINATION:  VITAL SIGNS:  Temperature 98, blood pressure 155/82, pulse 87 and regular, respirations 16, and weight 67.9 kg. GENERAL:  No acute distress, lying quietly in an hospital bed. Initially talking on a cellphone as I entered the room. SKIN:  Appropriate warmth, dry.  She has a healing abrasion on the lower leg, but no ulcerations of the feet or maceration between the toes. MUSCULOSKELETAL:  Small tender nodule on the anterior aspect of the mid portion of her tibia of the right lower leg.  This  seems contiguous with the bone, but not contiguous with the skin.  No apparent atrophy in the limbs. NEUROLOGIC:  Intact monofilament sensation.  Deep tendon reflexes 1+ and symmetric in the patellar tendon, cranial nerves grossly intact.  Light touch sensation intact in the feet. MENTAL STATUS:  Alert, speech clear, thought logical.  Insight and judgement seem appropriate.  Mildly diminished affect, but this seems appropriate for circumstances.  No hallucinations or delusions evident. GASTROINTESTINAL:  Active bowel sounds.  Soft, nontender, nondistended abdomen. CARDIOVASCULAR:  Regular rhythm and rate with palpable full dorsalis pedis pulses in each foot. RESPIRATORY:  Relatively clear, unlabored.  Normal respiratory effort at this time. NECK:  No appreciable thyromegaly.  Trachea midline, neck supple.  LABORATORY DATA:  Today, serum creatinine 5.67, potassium 4.7, total CO2 is 27, glucose 173 mg/dL.  I reviewed and documented in the chart her glucose levels over the past 4 days, ranging from 40 to 474,  her tendency towards hypoglycemia when treating for hyperglycemia, but hyperglycemia after receiving mealtime insulin when euglycemic before the meal.  Of note, the patient does not recall receiving any Lantus insulin at bedtime last evening, though 12 units of Lantus insulin were reported.  Her  glucose had risen from 90 mg/dL at bedtime this evening to 399 mg/dL this morning.  ASSESSMENT AND PLAN: 1. Type 1 diabetes mellitus, uncontrolled with renal and ophthalmic     manifestations. 2. Proliferative diabetic retinopathy. 3. 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  At this time, she is in the hospital and eating less in calories and carbohydrates than what she does at home. per her description.  Likely her insulin requirements would be different at home than they are here in the hospital.  We will try to reduce risk of hypoglycemia for now since hypoglycemia may be of greater risk in the short-term compared to hyperglycemia.  At this time, I will change her Lantus insulin to 7 units subcutaneous twice a day rather than 12 or 14 units once a day.  Sometimes, the duration and peak of Lantus insulin favors twice a day dosing regimen for someone with type 1 diabetes mellitus.  She seems to need a bit more NovoLog insulin when she eats a meal even if euglycemic, but not as much NovoLog insulin when correcting hyperglycemia.  For this reason, I will change her NovoLog insulin to 4 units at each meal for the content of the meal plus one extra unit at the meal for every 70 mg/dL of blood glucose about 100 mg/dL.  In practical terms, this means if her blood glucose is 70-170 mg/dL, then 4 units of NovoLog; if glucose 171-240 mg/dL, then 5 units of NovoLog; if blood glucose 241-310 mg/dL, then 6 units of NovoLog; if blood glucose 311-380 mg/dL, then 7 units of NovoLog.  If blood glucose over 380 mg/dL, then 8 units of NovoLog.  We can adjust this regimen as indicated.  Ultimately, the outpatient management of her glycemic control will be of greater consequence in the long-term than the management of glucose as an inpatient.  Note that by reducing the Lantus to 7 units this evening, since we are going to a twice daily regimen, she may have a higher glucose level tomorrow  morning.  However, this should improve as she continues to receive this Lantus insulin twice a day.  The patient agreed to a visit with me at [facility] at 11 a.m. on Wednesday, March 14, 2011.  I gave her our business card with the address and telephone number.  [facility] is located at [facility], [address], [address], [place], [place], phone number [phone].    Note, she prefers insulin pens rather than insulin vials.  Her pharmacy is [facility] on the corner of [address] and [address].     Tonita Cong, M.D.     JSK/MEDQ  D:  03/07/2011  T:  03/08/2011  Job:  [id number]  Electronically Signed by Talmage Coin M.D. on 03/08/2011 01:45:16 PM

## 2011-03-09 LAB — POCT I-STAT 4, (NA,K, GLUC, HGB,HCT)
Operator id: 181601
Potassium: 4.8
Sodium: 132 — ABNORMAL LOW

## 2011-03-09 LAB — HCG, SERUM, QUALITATIVE: Preg, Serum: NEGATIVE

## 2011-03-09 NOTE — Discharge Summary (Signed)
[name], [name]               ACCOUNT NO.:  [id number]  MEDICAL RECORD NO.:  [id number]  LOCATION:  6525                         FACILITY:  [facility]  PHYSICIAN:  Kela Millin, M.D.DATE OF BIRTH:  1971-05-25  DATE OF ADMISSION:  02/18/2011 DATE OF DISCHARGE:  03/01/2011                        DISCHARGE SUMMARY - REFERRING   DISCHARGE DIAGNOSES: 1. Hyperosmolar nonketotic hyperglycemia -- resolved. 2. Uncontrolled/brittle diabetes mellitus -- with significant     fluctuations in CBGs. 3. Recent pneumonia with persistent cough. 4. Pleuritic chest pain -- possibly secondary to recent pneumonia with     versus persistent cough versus musculoskeletal, workup negative. 5. End-stage renal disease -- on hemodialysis, Tuesdays, Thursdays,     Saturdays. 6. Failed kidney, pancreas transplant -- on Prograf, CellCept and     prednisone. 7. Anemia of chronic disease. 8. Secondary hyperparathyroidism. 9. Gastroesophageal reflux disease. 10.Hypertension. 11.Dyslipidemia.  CONSULTATIONS:  Nephrology -- Dr. Eliott Nine and [facility].  PROCEDURES AND STUDIES: 1. Chest x-ray on January 28, 2011 - mild pulmonary edema. 2. V/Q scan on October 2, -- very low likelihood for PE.  BRIEF HISTORY:  The patient is a 40 year old black female with the above listed medical problems who was admitted with shortness of breath, nausea, vomiting as well as wheezing on exam.  On evaluation, she was found to have hyperosmolar nonketotic hyperglycemia.  She was admitted for further evaluation and management.  The patient was also having some pleuritic chest pain and it was noted that chest x-ray on admission showed some mild pulmonary edema but no findings consistent with a new pneumonia.  Please see the admission history and physical dictated on February 18, 2011, for the details of the admission physical exam as well as history and the laboratory data.  HOSPITAL COURSE: 1.  Hyperosmolar nonketotic hyperglycemia -- the patient was initially     placed on  the Glucommander protocol and was subsequently switched     to Lantus when she met the criteria.  It was noted that the patient     has been having significant fluctuations in her blood sugars and     has been very sensitive to NovoLog stacking.  Her Lantus was     decreased because she was having some hypoglycemic episodes.  She     has not had any further hypoglycemic episodes in the past 36-48     hours.  She is to keep a log of her blood sugars upon discharge and     is to follow up with her primary care physician for further     adjustment of her insulin and referral to Endocrinology per primary     care physician upon discharge is recommended. 2. Pleuritic chest pain -- as discussed above.  The patient had had     recent bronchitis and pneumonia and was treated with 2 more days of     antibiotics during this hospitalization.  Because she was still     having some pleuritic chest pain following that, workup included a     V/Q scan which came back negative.  She had a chest x-ray as well     which did not show any findings consistent  with pneumonia.  The     patient had cardiac enzymes done and they were negative.  Also it     was suspected that some of the pain she was having might have been     musculoskeletal related to the strain from coughing.  She was     treated with p.r.n. Percocet when this workup as already discussed     came back negative.  The patient did have fevers/low-grade     temperature while in the hospital and blood cultures were obtained     and to date showing no growth.  Doppler ultrasound was also done     and came back with no DVT.  She defervesced and has not had any     further fevers, also no leukocytosis -- last white cell count today     4.9. 3. End-stage renal disease -- [facility] was consulted while the     patient was in the hospital and they followed and dialyzed  the     patient.  She did receive dialysis today and is to follow up     outpatient for a scheduled Tuesday, Thursday dialysis. 4. Failed kidney, pancreas transplant -- Dr. Jomarie Longs indicated that Dr.     Kathrene Bongo had discussed the case with Dr. Prudencio Pair at [facility] and they recommended that the prednisone, CellCept and     Prograf doses could be decreased and so the doses were lowered and     she is to follow up with Nephrology outpatient for further tapering     of the immunosuppressants as clinically appropriate. 5. Anemia of chronic disease -- the patient is to continue with     Procrit per Renal.  DISCHARGE MEDICATIONS: 1. Percocet 5/325 mg 1-2 tablets every 6 hours p.r.n. 2. CellCept 500 mg, half to 1 tablet p.o. b.i.d. 3. Lantus 12 units at bedtime, follow up outpatient for further     adjustment as directed. 4. NovoLog 5 units subcu t.i.d. before meals p.r.n. as previously. 5. Prograf 1 mg 2 capsules b.i.d. 6. Tessalon Perles 100 mg 1 tablet p.o. b.i.d. 7. Albuterol MDI 2 puffs every 4 hours p.r.n. 8. Ambien 10 mg p.o. at bedtime. 9. Aspirin enteric-coated 81 mg p.o. daily. 10.Clonazepam 1 mg 2 tablets at bedtime.11.Diphenhydramine 25 mg 2 tablets b.i.d. 12.Flexeril 10 mg 1 tablet bedtime. 13.Lisinopril 10 mg p.o. bedtime. 14.Metoprolol 100 mg 1 tablet p.o. bedtime. 15.Norvasc 10 mg p.o. bedtime. 16.Phenergan 25 mg 1 tablet b.i.d. 17.PhosLo 667 mg 3 tablets p.o. t.i.d. with meals. 18.Prednisone 5 mg 1 tablet p.o. daily. 19.Protonix 40 mg p.o. daily. 20.Renal vitamin 1 tablet p.o. bedtime.  DISCONTINUED MEDICATIONS:  Avelox.  FOLLOWUP CARE: 1. Dr. Dorothyann Peng in 1 week. 2. [facility] as directed.  DISCHARGE CONDITION:  Improved.  Stable.     Kela Millin, M.D.     ACV/MEDQ  D:  03/01/2011  T:  03/01/2011  Job:  [id number]  cc:   Candyce Churn. Allyne Gee, M.D.  Electronically Signed by Donnalee Curry M.D. on 03/09/2011 09:30:02 PM

## 2011-03-10 NOTE — H&P (Signed)
[name], [name] NO.:  [id number]  MEDICAL RECORD NO.:  [id number]  LOCATION:  [facility]                         FACILITY:  [facility]  PHYSICIAN:  Lonia Blood, M.D.      DATE OF BIRTH:  1970-06-09  DATE OF ADMISSION:  02/07/2011 DATE OF DISCHARGE:                             HISTORY & PHYSICAL   PRIMARY CARE PHYSICIAN:  Robyn N. Allyne Gee, MD  NEPHROLOGIST:  [facility].  PRESENTING COMPLAINT:  Shortness of breath and cough.  HISTORY OF PRESENT ILLNESS:  The patient is a 40 year old female with end-stage renal disease, who is status post transplant back in 2004 at [facility].  Subsequently, the patient developed failure to her graft.  She is still having hemodialysis Tuesday, Thursday, and Saturdays.  She was at dialysis apparently a few days ago.  Per the patient, she got exposed to somebody who was coughing.  Since then, she started having shortness of breath and cough and came to the emergency room today.  She has associated fever and chills.  The patient was found to have bilateral pneumonia.  She was last hospitalized about to 2 months ago precisely at the end of July 2012.  She denied any diarrhea nor constipation, but she continues to cough which is mildly productive.  Denied any hemoptysis.  PAST MEDICAL HISTORY:  Significant for: 1. Brittle diabetes. 2. End-stage renal disease.  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  Currently on hemodialysis Tuesdays,     Thursdays, and Saturdays. 3. History of GERD. 4. Hyperlipidemia. 5. Secondary hyperparathyroidism. 6. Hypertension. 7. Migraine headaches. 8. Anemia of chronic disease.  ALLERGIES:  MORPHINE SULFATE, TRAMADOL, CHLORHEXIDINE, METRONIDAZOLE, HYDROCODONE, PENICILLIN, and ZOLOFT.  MORPHINE causes delirium. TRAMADOL nausea, vomiting.  CHLORHEXIDINE rash and hives.  METRONIDAZOLE and VICODIN causes nausea and vomiting.  PENICILLIN causes rash and ZOLOFT  some unusual response.  CURRENT MEDICATIONS: 1. Albuterol inhaler 2 puffs every 4 hours. 2. Flexeril 10 mg nightly. 3. Rena-Vite 1 tablet daily. 4. Protonix 40 mg daily. 5. Prograf 1 mg tablet, she takes 3 capsules twice a day. 6. Prednisone 5 mg 1 tablet daily. 7. PhosLo 3-4 tablets t.i.d. 8. Phenergan 25 mg twice daily as needed. 9. NovoLog sliding scale insulin. 10.Norvasc 10 mg daily. 11.Metoprolol 100 mg nightly. 12.Lantus insulin 10 units daily at bedtime. 13.Diphenhydramine 25 mg 2-3 tablets daily. 14.Clonazepam 1 mg 2 tablets nightly. 15.CellCept 500 mg b.i.d. 16.Aspirin 81 mg daily. 17.Ambien 10 mg nightly p.r.n.  SOCIAL HISTORY:  The patient lives here in [place].  She denied any current tobacco, alcohol, or IV drug use.  She goes to her dialysis regularly without a problem.  She lives alone.  FAMILY HISTORY:  Significant for hypertension, diabetes, coronary artery disease.  REVIEW OF SYSTEMS:  All systems reviewed and currently are negative except per HPI.  PHYSICAL EXAMINATION:  VITAL SIGNS:  Her temperature here is 98.9, initial blood pressure 187/98, pulse 82, respiratory rate 20, and her sat is 74% on room air. GENERAL:  The patient is awake, alert, looks very much acutely ill, but she is in no acute distress. HEENT:  PERRL.  EOMI.  No  significant pallor.  No jaundice.  No rhinorrhea. NECK:  Supple.  No JVD.  No lymphadenopathy. RESPIRATORY:  She has decreased air entry at the bases with somewhat basilar crackles.  No wheezes.  No rales. CARDIOVASCULAR:  She has S1 and S2.  No murmur. ABDOMEN:  Soft, full, nontender with positive bowel sounds. EXTREMITIES:  No edema, cyanosis, or clubbing. SKIN:  No rashes or ulcers.  LABORATORY DATA:  Her white count is 9.2 with mild left shift, hemoglobin 10.1 with platelet of 171, and an MCV of 97.  Sodium 130, potassium 4.6, chloride 88, CO2 of 27, glucose 817, BUN is 35, creatinine was 7.36 with her GFR of 8,  and calcium 8.2.  Lactic acid is 1.3.  Chest x-ray showed bilateral lower lobe airspace disease consistent with pneumonia.  Her EKG showed normal sinus rhythm with a rate of 84 with probable left atrial abnormality.  ASSESSMENT:  This is a 40 year old female presenting with what appears to be bilateral pneumonia.  More than likely, this reflects healthcare- associated pneumonia since the patient was in the hospital within the last 90 days.  She also goes to hemodialysis.  In addition, she has hyperosmolar hyperglycemia.  Her bicarb is normal, so she is not in diabetic ketoacidosis even though she has type 1 diabetes.  Her anion gap is about 15, but still based on the fact that the bicarb is normal, she is more than likely hyperosmolar only.  She has pseudohyponatremia again.  PLAN: 1. Healthcare-associated pneumonia.  We will admit the patient and put     her on the pneumonia protocol index.  I will send her to step-down     unit since she looks sick with SIRS at this point.  I will keep her     on IV cefepime, vancomycin, and Levaquin for at least 8 days. 2. Hyperosmolar hyperglycemia, more than likely secondary to herbrittle type 1 diabetes.  I will put her on glucose stabilizer     protocol with IV insulin.  Once her sugar is controlled, we will     transition to subcutaneous insulin. 3. Hypertension.  Blood pressure seems to be very high.  At this     point, she is due for dialysis tomorrow.  I will resume her home     medication and further treatment for her dialysis. 4. End-stage renal disease.  Again, she is due for dialysis tomorrow     being Thursday and we will let Nephrology know, so she will be     added to the list. 5. GERD.  I will continue with PPIs. 6. Other medical problems are all chronic and stable, so we will     continue with her care at this point.     Lonia Blood, M.D.     Verlin Grills  D:  02/07/2011  T:  02/07/2011  Job:  [id number]  Electronically  Signed by Lonia Blood M.D. on 03/10/2011 03:14:54 PM

## 2011-03-12 LAB — CBC
MCHC: 31.1
MCV: 98.7
RBC: 4.02
RDW: 16.1 — ABNORMAL HIGH

## 2011-03-12 LAB — BASIC METABOLIC PANEL
CO2: 29
Chloride: 99
Creatinine, Ser: 5.97 — ABNORMAL HIGH
GFR calc Af Amer: 10 — ABNORMAL LOW
Glucose, Bld: 91

## 2011-03-12 LAB — PROTIME-INR: Prothrombin Time: 12.9

## 2011-03-12 LAB — CATH TIP CULTURE: Culture: 10

## 2011-03-23 ENCOUNTER — Encounter: Payer: Self-pay | Admitting: Internal Medicine

## 2011-03-23 ENCOUNTER — Ambulatory Visit (INDEPENDENT_AMBULATORY_CARE_PROVIDER_SITE_OTHER): Payer: Medicare Other | Admitting: Internal Medicine

## 2011-03-23 DIAGNOSIS — I10 Essential (primary) hypertension: Secondary | ICD-10-CM

## 2011-03-23 DIAGNOSIS — R05 Cough: Secondary | ICD-10-CM

## 2011-03-23 DIAGNOSIS — R059 Cough, unspecified: Secondary | ICD-10-CM

## 2011-03-23 MED ORDER — AMLODIPINE BESYLATE-VALSARTAN 10-160 MG PO TABS: 1.0000 | ORAL_TABLET | Freq: Every day | ORAL | Status: DC

## 2011-03-23 NOTE — Patient Instructions (Signed)
Stop lisinopril and norvasc  Start exforge 10/160 one daily   GERD (REFLUX)  is an extremely common cause of respiratory symptoms, many times with no significant heartburn at all.    It can be treated with medication, but also with lifestyle changes including avoidance of late meals, excessive alcohol, smoking cessation, and avoid fatty foods, chocolate, peppermint, colas, red wine, and acidic juices such as orange juice.  NO MINT OR MENTHOL PRODUCTS SO NO COUGH DROPS  USE SUGARLESS CANDY INSTEAD (jolley ranchers or Stover's)  NO OIL BASED VITAMINS - use powdered substitutes.    If you are satisfied with your treatment plan let your doctors know and he/she can either refill your medications or you can return here when your prescription runs out.     If in any way you are not 100% satisfied,  please tell us.  If 100% better, return in one month.

## 2011-03-23 NOTE — Progress Notes (Signed)
  Subjective:    Patient ID: [name], female    DOB: [birth year], 40 y.o.   MRN: [id number]  HPI  45 yobf never smoker never asthma on HD referred by Dr Caryn Section for  Chronic cough and sob in Oct 2012  03/23/2011 Initial pulmonary office eval  on ACEI  cc cough x 6 months admitted twice with dx of pneumonia but cough never resolved worse when lie down does not tend to wake up mostly dry. 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     Assoc with chest discomfort worsened with coughing. Mild assoc nasal congestion/ sneezing but no purulent secretions, sinus pain, tooth problems, overt hb. Better only with narcotics. No previous hx of seasonal rhinitis overt HB or allergies/ asthma.     Review of Systems  Constitutional: Positive for appetite change. Negative for fever, chills and unexpected weight change.  HENT: Positive for sneezing. Negative for ear pain, nosebleeds, congestion, sore throat, rhinorrhea, trouble swallowing, dental problem, voice change, postnasal drip and sinus pressure.   Eyes: Negative for visual disturbance.  Respiratory: Positive for cough and shortness of breath. Negative for choking.   Cardiovascular: Positive for chest pain and leg swelling.  Gastrointestinal: Negative for vomiting, abdominal pain and diarrhea.  Genitourinary: Negative for difficulty urinating.  Musculoskeletal: Negative for arthralgias.  Skin: Negative for rash.  Neurological: Negative for tremors, syncope and headaches.  Hematological: Does not bruise/bleed easily.  Psychiatric/Behavioral: The patient is nervous/anxious.        Objective:   Physical Exam  Extremely hoarse pleasant mod obese bf nad with minimal pseudowheeze  Wt 160 03/23/11  HEENT: nl dentition, turbinates, and orophanx. Nl external ear canals without cough reflex   NECK :  without JVD/Nodes/TM/ nl carotid upstrokes bilaterally   LUNGS: no acc muscle use, clear to A and  P bilaterally without cough on insp or exp maneuvers   CV:  RRR  no s3 or murmur or increase in P2, no edema   ABD:  soft and nontender with nl excursion in the supine position. No bruits or organomegaly, bowel sounds nl  MS:  warm without deformities, calf tenderness, cyanosis or clubbing  SKIN: warm and dry without lesions    NEURO:  alert, approp, no deficits   cxr 02/27/11 Comparison: February 24, 2011  Findings: There is cardiomegaly and mild pulmonary edema. No gross  pleural effusions or focal infiltrates. The right IJ central line  tip is at the superior right atrial level. No pneumothorax.  IMPRESSION:  Mild pulmonary edema.     Assessment & Plan:

## 2011-03-24 ENCOUNTER — Encounter: Payer: Self-pay | Admitting: Internal Medicine

## 2011-03-24 DIAGNOSIS — R05 Cough: Secondary | ICD-10-CM | POA: Insufficient documentation

## 2011-03-24 DIAGNOSIS — I10 Essential (primary) hypertension: Secondary | ICD-10-CM | POA: Insufficient documentation

## 2011-03-24 NOTE — Assessment & Plan Note (Signed)
The most common causes of chronic cough in immunocompetent adults include the following: upper airway cough syndrome (UACS), previously referred to as postnasal drip syndrome (PNDS), which is caused by variety of rhinosinus conditions; (2) asthma; (3) GERD; (4) chronic bronchitis from cigarette smoking or other inhaled environmental irritants; (5) nonasthmatic eosinophilic bronchitis; and (6) bronchiectasis.   These conditions, singly or in combination, have accounted for up to 94% of the causes of chronic cough in prospective studies.   Other conditions have constituted no >6% of the causes in prospective studies These have included bronchogenic carcinoma, chronic interstitial pneumonia, sarcoidosis, left ventricular failure, ACEI-induced cough, and aspiration from a condition associated with pharyngeal dysfunction.   This is most c/w  Classic Upper airway cough syndrome, so named because it's frequently impossible to sort out how much is  CR/sinusitis with freq throat clearing (which can be related to primary GERD)   vs  causing  secondary (" extra esophageal")  GERD from wide swings in gastric pressure that occur with throat clearing, often  promoting self use of mint and menthol lozenges that reduce the lower esophageal sphincter tone and exacerbate the problem further in a cyclical fashion.   These are the same pts who not infrequently have failed to tolerate ace inhibitors,  dry powder inhalers or biphosphonates or report having reflux symptoms that don't respond to standard doses of PPI , and are easily confused as having aecopd or asthma flares,   Therefore start with stopping ace

## 2011-03-24 NOTE — Assessment & Plan Note (Signed)

## 2011-04-02 ENCOUNTER — Other Ambulatory Visit: Payer: Self-pay | Admitting: Internal Medicine

## 2011-04-13 ENCOUNTER — Ambulatory Visit: Payer: Medicare Other | Admitting: Internal Medicine

## 2011-04-15 ENCOUNTER — Emergency Department (INDEPENDENT_AMBULATORY_CARE_PROVIDER_SITE_OTHER)
Admission: EM | Admit: 2011-04-15 | Discharge: 2011-04-15 | Disposition: A | Discharge status: Home / Self Care | Payer: Medicare Other | Source: Home / Self Care

## 2011-04-15 ENCOUNTER — Encounter (HOSPITAL_COMMUNITY): Payer: Self-pay | Admitting: *Deleted

## 2011-04-15 DIAGNOSIS — L509 Urticaria, unspecified: Secondary | ICD-10-CM

## 2011-04-15 MED ORDER — PREDNISONE (PAK) 10 MG PO TABS: 10.0000 mg | ORAL_TABLET | Freq: Every day | ORAL | Status: AC

## 2011-04-15 MED ORDER — HYDROXYZINE HCL 25 MG PO TABS: 25.0000 mg | ORAL_TABLET | Freq: Four times a day (QID) | ORAL | Status: AC

## 2011-04-15 MED ORDER — TRIAMCINOLONE ACETONIDE 0.1 % EX OINT: TOPICAL_OINTMENT | Freq: Two times a day (BID) | CUTANEOUS | Status: DC

## 2011-04-15 NOTE — ED Provider Notes (Signed)
History     CSN: [id number] Arrival date & time: 04/15/2011  9:36 AM   First MD Initiated Contact with Patient 04/15/11 [phone]      Chief Complaint  Patient presents with  . Rash    (Consider location/radiation/quality/duration/timing/severity/associated sxs/prior treatment) Patient is a 40 y.o. female presenting with rash. The history is provided by the patient.  Rash  This is a new problem. The current episode started yesterday. The problem has not changed since onset.The problem is associated with chemical exposure (used a perfume heair lotion yesterday before itchness started first around her face were still is worse then she is having generalized itchiness, denies new medications.). There has been no fever. The rash is present on the face, scalp, torso, right arm, left arm, left upper leg and right upper leg. The pain is at a severity of 6/10. The pain has been intermittent since onset. Associated symptoms include itching. Pertinent negatives include no blisters and no weeping. She has tried antihistamines for the symptoms. The treatment provided mild relief.    Past Medical History  Diagnosis Date  . Diabetes mellitus   . Hypertension   . Renal insufficiency   . Hyperlipidemia   . Alopecia   . GERD (gastroesophageal reflux disease)   . Obesity   . Iron deficiency   . ESRD (end stage renal disease)   . RLS (restless legs syndrome)   . PNA (pneumonia)   . Respiratory failure   . Injury of conjunctiva and corneal abrasion of right eye without foreign body   . Cellulitis     Past Surgical History  Procedure Date  . Cholecystectomy   . Combined kidney-pancreas transplant   . Thyroglobulin     x 7    History reviewed. No pertinent family history.  History  Substance Use Topics  . Smoking status: Never Smoker   . Smokeless tobacco: Never Used  . Alcohol Use: No    OB History    Grav Para Term Preterm Abortions TAB SAB Ect Mult Living                  Review  of Systems  Constitutional: Negative.   HENT: Negative for congestion, sore throat, mouth sores and trouble swallowing.   Eyes: Negative for discharge and itching.  Gastrointestinal: Negative for nausea and vomiting.  Skin: Positive for itching and rash.    Allergies  Flagyl; Morphine and related; and Tramadol  Home Medications   Current Outpatient Rx  Name Route Sig Dispense Refill  . AMLODIPINE BESYLATE-VALSARTAN 10-160 MG PO TABS Oral Take 1 tablet by mouth daily.    . ASPIRIN 81 MG PO TABS Oral Take 81 mg by mouth daily.      Marland Kitchen CALCIUM ACETATE 667 MG PO CAPS  2-3 tablets with every meal    . CYCLOBENZAPRINE HCL 5 MG PO TABS Oral Take 1 tablet by mouth Three times daily as needed.    Marland Kitchen HYDROXYZINE HCL 25 MG PO TABS Oral Take 1 tablet (25 mg total) by mouth every 6 (six) hours. 12 tablet 0  . LANTUS SOLOSTAR 100 UNIT/ML [place] SOLN  As directed    . NOVOLOG FLEXPEN 100 UNIT/ML [place] SOLN  As directed    . PANTOPRAZOLE SODIUM 40 MG PO TBEC Oral Take 1 tablet by mouth Every morning.    Marland Kitchen PREDNISONE 5 MG PO TABS Oral Take 1 tablet by mouth daily.    Marland Kitchen PREDNISONE (PAK) 10 MG PO TABS Oral Take 1 tablet (  10 mg total) by mouth daily. Take 5 tabs on day #1 then          4 tabs on day # 2           3 tabs on day #3          2 tabs on day #5          1 tab on day #6 then continue with usual daily prednisone dose. (Hold daily 5mg  dose until restart day wich is day #7 of taper) 15 tablet no  . PROAIR HFA 108 (90 BASE) MCG/ACT IN AERS  2 puffs every 4-6 hours as needed    . PROMETHAZINE HCL 25 MG PO TABS  1 tablet twice daily as needed    . TOPROL XL 100 MG PO TB24 Oral Take 1 tablet by mouth daily.    . TRIAMCINOLONE ACETONIDE 0.1 % EX OINT Topical Apply topically 2 (two) times daily. 30 g 0  . ZOLPIDEM TARTRATE 10 MG PO TABS Oral Take 1 tablet by mouth at bedtime as needed.      BP 85/59  Pulse 78  Temp(Src) 98 F (36.7 C) (Oral)  Resp 18  SpO2 98%  LMP 04/15/2011  Physical Exam  Nursing  note and vitals reviewed. Constitutional: She appears well-developed and well-nourished. No distress.  HENT:  Head: Normocephalic and atraumatic.  Mouth/Throat: Oropharynx is clear and moist.  Eyes: Conjunctivae are normal. Pupils are equal, round, and reactive to light. Right eye exhibits no discharge. Left eye exhibits no discharge. No scleral icterus.  Cardiovascular: Normal rate, regular rhythm and normal heart sounds.   Pulmonary/Chest: Breath sounds normal.  Lymphadenopathy:    She has no cervical adenopathy.  Skin: Rash noted. Rash is urticarial. Rash is not vesicular.       ED Course  Procedures (including critical care time)  Labs Reviewed - No data to display No results found.   1. Urticaria       MDM  Pt with h/o DM and ESRD on hemodialysis.( Low blood pressure on day after HD no symptomatic). Impress urticarial rash after used of new hair lotion as rash more evident along hair line and face. Pt. On chronic low dose steroids. Treated with hydroxyzine, topical triamcinolone and gave a detailed steroid taper to restablish chronic dose on day #7.        Sharin Grave, MD 04/16/11 1946

## 2011-04-15 NOTE — ED Notes (Signed)
Co raised rash to flank areas, and legs x 1 day with itching and pain

## 2011-04-23 ENCOUNTER — Encounter: Payer: Self-pay | Admitting: Internal Medicine

## 2011-04-23 ENCOUNTER — Ambulatory Visit (INDEPENDENT_AMBULATORY_CARE_PROVIDER_SITE_OTHER): Payer: Medicare Other | Admitting: Internal Medicine

## 2011-04-23 DIAGNOSIS — R05 Cough: Secondary | ICD-10-CM

## 2011-04-23 DIAGNOSIS — I10 Essential (primary) hypertension: Secondary | ICD-10-CM

## 2011-04-23 MED ORDER — ALBUTEROL SULFATE HFA 108 (90 BASE) MCG/ACT IN AERS: 2.0000 | INHALATION_SPRAY | RESPIRATORY_TRACT | Status: DC | PRN

## 2011-04-23 MED ORDER — FAMOTIDINE 20 MG PO TABS: ORAL_TABLET | ORAL | Status: DC

## 2011-04-23 NOTE — Progress Notes (Signed)
  Subjective:    Patient ID: [name], female    DOB: [birth year], 40 y.o.   MRN: [id number]  HPI  36 yobf never smoker never asthma on HD referred by Dr Caryn Section for  Chronic cough and sob in Oct 2012  03/23/2011 Initial pulmonary office eval  on ACEI  cc cough x 6 months admitted twice with dx of pneumonia but cough never resolved worse when lie down does not tend to wake up mostly dry. 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     Assoc with chest discomfort worsened with coughing. Mild assoc nasal congestion/ sneezing but no purulent secretions, sinus pain, tooth problems, overt hb. Better only with narcotics. No previous hx of seasonal rhinitis overt HB or allergies/ asthma.  rec Stop lisinopril and norvasc Start exforge 10/160 one daily  GERD (REFLUX)   04/23/2011 f/u ov/[place] Kohles back on ace x one week cc patient states having pain up under her right breast productive of minimal white mucus, slt feverish over weekend with assoc wheezing,  Better on day of ov s rx better with albuterol.   No overt sinus or hb symptoms  Sleeping ok without nocturnal  or early am exacerbation  of respiratory  c/o's or need for noct saba. Also denies any obvious fluctuation of symptoms with weather or environmental changes or other aggravating or alleviating factors except as outlined above  ROS  At present neg for  any significant sore throat, dysphagia, itching, sneezing,  nasal congestion or excess/ purulent secretions,  fever, chills, sweats, unintended wt loss, pleuritic or exertional cp, hempoptysis, orthopnea pnd or leg swelling.  Also denies presyncope, palpitations, heartburn, abdominal pain, nausea, vomiting, diarrhea  or change in bowel or urinary habits, dysuria,hematuria,  rash, arthralgias, visual complaints, headache, numbness weakness or ataxia.             Objective:   Physical Exam  Still  hoarse pleasant mod amb obese bf nad with  minimal pseudowheeze  Wt 160 03/23/11 >  161 04/23/2011   HEENT: nl dentition, turbinates, and orophanx. Nl external ear canals without cough reflex   NECK :  without JVD/Nodes/TM/ nl carotid upstrokes bilaterally   LUNGS: no acc muscle use, completley  clear to A and P bilaterally without cough on insp or exp maneuvers   CV:  RRR  no s3 or murmur or increase in P2, no edema   ABD:  soft and nontender with nl excursion in the supine position. No bruits or organomegaly, bowel sounds nl  MS:  warm without deformities, calf tenderness, cyanosis or clubbing      cxr 02/27/11 Comparison: February 24, 2011  Findings: There is cardiomegaly and mild pulmonary edema. No gross  pleural effusions or focal infiltrates. The right IJ central line  tip is at the superior right atrial level. No pneumothorax.  IMPRESSION:  Mild pulmonary edema.     Assessment & Plan:

## 2011-04-23 NOTE — Patient Instructions (Signed)
Add pepcid 20mg  one at bedtime and continue the protonix 40 mg Take 30-60 min before first meal of the day   GERD (REFLUX)  is an extremely common cause of respiratory symptoms, many times with no significant heartburn at all.    It can be treated with medication, but also with lifestyle changes including avoidance of late meals, excessive alcohol, smoking cessation, and avoid fatty foods, chocolate, peppermint, colas, red wine, and acidic juices such as orange juice.  NO MINT OR MENTHOL PRODUCTS SO NO COUGH DROPS  USE SUGARLESS CANDY INSTEAD (jolley ranchers or Stover's)  NO OIL BASED VITAMINS - use powdered substitutes.   Only use the proiare if you can't catch your breath  Use delsym for cough  Please schedule a follow up office visit in 6 weeks, call sooner if needed   .

## 2011-04-23 NOTE — Assessment & Plan Note (Signed)
Pt reports better control on exforge 10/160 one daily so given samples x 2 months

## 2011-04-23 NOTE — Assessment & Plan Note (Signed)
For reasons not clear to me she's back on ACEI  - ACE inhibitors are problematic in  pts with airway complaints because  even experienced pulmonologists can't always distinguish ace effects from copd/asthma.  By themselves they don't actually cause a problem, much like oxygen can't by itself start a fire, but they certainly serve as a powerful catalyst or enhancer for any "fire"  or inflammatory process in the upper airway, be it caused by an ET  tube or more commonly reflux (especially in the obese or pts with known GERD or who are on biphoshonates).    In the era of ARB near equivalency until we have a better handle on the reversibility of the airway problem, it just makes sense to avoid ACEI  entirely in the short run and then decide later, having established a level of airway control using a reasonable limited regimen, whether to add back ace but even then being very careful to observe the pt for worsening airway control and number of meds used/ needed to control symptoms.    For now leave off ace and add gerd rx max given that  chronic cough is often simultaneously caused by more than one condition. A single cause has been found from 38 to 82% of the time, multiple causes from 18 to 62%. Multiply caused cough has been the result of three diseases up to 42% of the time.

## 2011-05-02 ENCOUNTER — Telehealth: Payer: Self-pay

## 2011-05-02 NOTE — Telephone Encounter (Signed)
Pt. called to report has been having pain in right inner thigh off and on for past 2 weeks "where they took the vein from my right thigh for my dialysis access."   (hx DRIL procedure 12/2009)  Denies any swelling, redness or warmth, or any lump @ site of pain.  Discussed w/ Dr. Edilia Bo and advised to schedule office visit for eval., but no vascular lab needed at this time.  Left msg. for pt. to expect phone call from office to schedule appt.

## 2011-05-03 ENCOUNTER — Encounter: Payer: Self-pay | Admitting: Physician Assistant

## 2011-05-04 ENCOUNTER — Encounter: Payer: Self-pay | Admitting: Physician Assistant

## 2011-05-04 ENCOUNTER — Ambulatory Visit (INDEPENDENT_AMBULATORY_CARE_PROVIDER_SITE_OTHER): Payer: Medicare Other | Admitting: Physician Assistant

## 2011-05-04 VITALS — BP 152/75 | HR 94 | Resp 18 | Ht 61.0 in | Wt 151.0 lb

## 2011-05-04 DIAGNOSIS — N186 End stage renal disease: Secondary | ICD-10-CM

## 2011-05-04 NOTE — Progress Notes (Signed)
[facility] [place] Postoperative Visit hemodialysis access   Date of Surgery: 8/11 Surgeon: Edilia Bo HD:  yes   CC: pain in her right thigh  HPI:  This is a 40 y.o. female who returns today s/p DRIL procedure 12/30/09.  She states that for the past 5-6 months, she has been having pain in the area of the vein harvest on her right thigh.  She states that it is worse when the weather is bad.  Her mother had an amputation from complications of her renal dz and she is afraid of this situation for herself.  She states that she went to the ED back in October for this pain.  She was given oxycodone #90 at that time.  She states that this helps her pain.  PHYSICAL EXAMINATION:  Filed Vitals:   05/04/11 1547  BP: 152/75  Pulse: 94  Resp: 18     Incisions are well healed with minimal keyloiding. Pulse:  2+ palpable DP/PT on the right Her calf is soft and symmetrical with the left. She has no edema in the BLE  ASSESSMENT/PLAN:  [name] is a 39 y.o. year old female who presents s/p DRIL procedure 8/11. I spoke with Dr. Imogene Burn about the pt.  He states that this is most likely nerve damage from the surgery.  I discussed this with the pt.   She will f/u with [place] on a PRN basis.  Pain meds are not asked for today and are not given.   [name] Pigg, PA-C [facility] [phone]  Clinic MD:   Imogene Burn

## 2011-05-12 ENCOUNTER — Emergency Department (HOSPITAL_COMMUNITY): Admit: 2011-05-12 | Discharge: 2011-05-12 | Discharge status: Against Medical Advice | Payer: Medicare Other

## 2011-05-12 ENCOUNTER — Emergency Department (INDEPENDENT_AMBULATORY_CARE_PROVIDER_SITE_OTHER)
Admission: EM | Admit: 2011-05-12 | Discharge: 2011-05-12 | Disposition: A | Discharge status: Nursing Home (Includes ICF) | Payer: Medicare Other | Source: Home / Self Care | Attending: Family Medicine | Admitting: Family Medicine

## 2011-05-12 ENCOUNTER — Encounter (HOSPITAL_COMMUNITY): Payer: Self-pay | Admitting: *Deleted

## 2011-05-12 DIAGNOSIS — R51 Headache: Secondary | ICD-10-CM

## 2011-05-12 DIAGNOSIS — R519 Headache, unspecified: Secondary | ICD-10-CM

## 2011-05-12 DIAGNOSIS — I152 Hypertension secondary to endocrine disorders: Secondary | ICD-10-CM

## 2011-05-12 DIAGNOSIS — I10 Essential (primary) hypertension: Secondary | ICD-10-CM

## 2011-05-12 DIAGNOSIS — E1159 Type 2 diabetes mellitus with other circulatory complications: Secondary | ICD-10-CM

## 2011-05-12 DIAGNOSIS — E1169 Type 2 diabetes mellitus with other specified complication: Secondary | ICD-10-CM

## 2011-05-12 NOTE — ED Notes (Addendum)
Pt with c/o headache x 2 weeks vomited x one today - remains nauseated - taking phenergan as needed for nausea

## 2011-05-12 NOTE — ED Notes (Signed)
UNABLE TO LOCATE PT. AT TRIAGE - TRANSFERRED FROM [facility].

## 2011-05-12 NOTE — ED Provider Notes (Signed)
History     CSN: [id number] Arrival date & time: 05/12/2011  7:45 PM   First MD Initiated Contact with Patient 05/12/11 1759      Chief Complaint  Patient presents with  . Headache  . Nausea  . Emesis    (Consider location/radiation/quality/duration/timing/severity/associated sxs/prior treatment) Patient is a 40 y.o. female presenting with headaches. The history is provided by the patient.  Headache The primary symptoms include headaches and nausea. Primary symptoms do not include fever. The symptoms began more than 1 week ago. The symptoms are unchanged.  Medical issues also include diabetes and hypertension. Associated medical issues comments: renal failure.    Past Medical History  Diagnosis Date  . Diabetes mellitus   . Hypertension   . Renal insufficiency   . Hyperlipidemia   . Alopecia   . GERD (gastroesophageal reflux disease)   . Obesity   . Iron deficiency   . ESRD (end stage renal disease)   . RLS (restless legs syndrome)   . PNA (pneumonia)   . Respiratory failure   . Injury of conjunctiva and corneal abrasion of right eye without foreign body   . Cellulitis     Past Surgical History  Procedure Date  . Cholecystectomy   . Combined kidney-pancreas transplant   . Thyroglobulin     x 7    History reviewed. No pertinent family history.  History  Substance Use Topics  . Smoking status: Never Smoker   . Smokeless tobacco: Never Used  . Alcohol Use: No    OB History    Grav Para Term Preterm Abortions TAB SAB Ect Mult Living                  Review of Systems  Constitutional: Negative for fever.  Cardiovascular: Negative.   Gastrointestinal: Positive for nausea.  Neurological: Positive for headaches.    Allergies  Flagyl; Morphine and related; Penicillins; and Tramadol  Home Medications   Current Outpatient Rx  Name Route Sig Dispense Refill  . AMLODIPINE BESYLATE-VALSARTAN 10-160 MG PO TABS Oral Take 1 tablet by mouth daily.    .  ASPIRIN 81 MG PO TABS Oral Take 81 mg by mouth daily.      Marland Kitchen CALCIUM ACETATE 667 MG PO CAPS  2-3 tablets with every meal    . MYCOPHENOLATE MOFETIL 250 MG PO CAPS Oral Take 250 mg by mouth 2 (two) times daily.      Marland Kitchen PANTOPRAZOLE SODIUM 40 MG PO TBEC Oral Take 1 tablet by mouth Every morning.    Marland Kitchen PREDNISONE 5 MG PO TABS Oral Take 1 tablet by mouth daily.    Marland Kitchen TACROLIMUS 1 MG PO CAPS Oral Take 1 mg by mouth 2 (two) times daily.      . TOPROL XL 100 MG PO TB24 Oral Take 1 tablet by mouth daily.    Marland Kitchen ZOLPIDEM TARTRATE 10 MG PO TABS Oral Take 1 tablet by mouth at bedtime as needed.    . ALBUTEROL SULFATE HFA 108 (90 BASE) MCG/ACT IN AERS Inhalation Inhale 2 puffs into the lungs every 4 (four) hours as needed for wheezing. 2 puffs every 4-6 hours as needed 1 Inhaler 2  . CYCLOBENZAPRINE HCL 5 MG PO TABS Oral Take 1 tablet by mouth Three times daily as needed.    Marland Kitchen FAMOTIDINE 20 MG PO TABS  One at bedtime    . LANTUS SOLOSTAR 100 UNIT/ML [place] SOLN  As directed    . NOVOLOG FLEXPEN 100 UNIT/ML [place]  SOLN  As directed    . PROMETHAZINE HCL 25 MG PO TABS  1 tablet twice daily as needed    . TRIAMCINOLONE ACETONIDE 0.1 % EX OINT Topical Apply topically 2 (two) times daily. 30 g 0    BP 190/97  Pulse 86  Temp(Src) 98.9 F (37.2 C) (Oral)  Resp 18  SpO2 97%  LMP 04/15/2011  Physical Exam  Constitutional: She is oriented to person, place, and time. She appears well-developed and well-nourished.  HENT:  Head: Normocephalic.  Right Ear: External ear normal.  Left Ear: External ear normal.  Mouth/Throat: Oropharynx is clear and moist.  Eyes: Pupils are equal, round, and reactive to light.  Neck: Normal range of motion. Neck supple. No thyromegaly present.  Cardiovascular: Normal rate, normal heart sounds and intact distal pulses.   Pulmonary/Chest: Effort normal and breath sounds normal.  Abdominal: Soft. Bowel sounds are normal.  Lymphadenopathy:    She has no cervical adenopathy.  Neurological:  She is alert and oriented to person, place, and time.  Skin: Skin is warm and dry.    ED Course  Procedures (including critical care time)  Labs Reviewed - No data to display No results found.   1. Headache around the eyes   2. Hypertension associated with diabetes       MDM  bilat persistent headache and hypertension.        Barkley Bruns, MD 05/12/11 2012

## 2011-05-13 ENCOUNTER — Emergency Department (HOSPITAL_COMMUNITY)
Admission: EM | Admit: 2011-05-13 | Discharge: 2011-05-13 | Disposition: A | Discharge status: Home / Self Care | Payer: Medicare Other | Attending: Emergency Medicine | Admitting: Emergency Medicine

## 2011-05-13 ENCOUNTER — Encounter (HOSPITAL_COMMUNITY): Payer: Self-pay | Admitting: Emergency Medicine

## 2011-05-13 DIAGNOSIS — N186 End stage renal disease: Secondary | ICD-10-CM | POA: Insufficient documentation

## 2011-05-13 DIAGNOSIS — Z794 Long term (current) use of insulin: Secondary | ICD-10-CM | POA: Insufficient documentation

## 2011-05-13 DIAGNOSIS — I120 Hypertensive chronic kidney disease with stage 5 chronic kidney disease or end stage renal disease: Secondary | ICD-10-CM | POA: Insufficient documentation

## 2011-05-13 DIAGNOSIS — H53149 Visual discomfort, unspecified: Secondary | ICD-10-CM | POA: Insufficient documentation

## 2011-05-13 DIAGNOSIS — Z79899 Other long term (current) drug therapy: Secondary | ICD-10-CM | POA: Insufficient documentation

## 2011-05-13 DIAGNOSIS — K219 Gastro-esophageal reflux disease without esophagitis: Secondary | ICD-10-CM | POA: Insufficient documentation

## 2011-05-13 DIAGNOSIS — R51 Headache: Secondary | ICD-10-CM

## 2011-05-13 DIAGNOSIS — E119 Type 2 diabetes mellitus without complications: Secondary | ICD-10-CM | POA: Insufficient documentation

## 2011-05-13 DIAGNOSIS — R112 Nausea with vomiting, unspecified: Secondary | ICD-10-CM | POA: Insufficient documentation

## 2011-05-13 DIAGNOSIS — Z992 Dependence on renal dialysis: Secondary | ICD-10-CM | POA: Insufficient documentation

## 2011-05-13 DIAGNOSIS — E785 Hyperlipidemia, unspecified: Secondary | ICD-10-CM | POA: Insufficient documentation

## 2011-05-13 MED ORDER — METOCLOPRAMIDE HCL 5 MG/ML IJ SOLN
10.0000 mg | Freq: Once | INTRAMUSCULAR | Status: AC
  Administered 2011-05-13: 10 mg via INTRAMUSCULAR
  Filled 2011-05-13: qty 2

## 2011-05-13 MED ORDER — DIPHENHYDRAMINE HCL 50 MG/ML IJ SOLN
25.0000 mg | Freq: Once | INTRAMUSCULAR | Status: AC
Start: 2011-05-13 — End: 2011-05-13
  Administered 2011-05-13: 25 mg via INTRAMUSCULAR
  Filled 2011-05-13: qty 1

## 2011-05-13 MED ORDER — OXYCODONE-ACETAMINOPHEN 5-325 MG PO TABS: 1.0000 | ORAL_TABLET | Freq: Four times a day (QID) | ORAL | Status: AC | PRN

## 2011-05-13 NOTE — ED Provider Notes (Signed)
History     CSN: [id number] Arrival date & time: 05/13/2011  9:44 AM   First MD Initiated Contact with Patient 05/13/11 1005      Chief Complaint  Patient presents with  . Headache    (Consider location/radiation/quality/duration/timing/severity/associated sxs/prior treatment) HPI Comments: Patient with history of end-stage renal disease on dialysis, pancreas and kidney transplant -- presents with her typical migraine symptoms for the past 2 days. Headache is frontal. It is unassociated with fever or neck pain. It is associated with phonophobia and photophobia. Patient has had nausea and vomiting. Last episode of vomiting was yesterday. Patient otherwise denies significant upper respiratory tract symptoms, chest pain, shortness of breath, abdominal pain, urinary symptoms. Patient was last dialyzed yesterday and session was uneventful.  Patient is a 40 y.o. female presenting with headaches. The history is provided by the patient.  Headache  This is a recurrent problem. The current episode started 2 days ago. The pain is located in the frontal region. The pain does not radiate. Associated symptoms include nausea and vomiting. Pertinent negatives include no fever. She has tried nothing for the symptoms.    Past Medical History  Diagnosis Date  . Diabetes mellitus   . Hypertension   . Renal insufficiency   . Hyperlipidemia   . Alopecia   . GERD (gastroesophageal reflux disease)   . Obesity   . Iron deficiency   . ESRD (end stage renal disease)   . RLS (restless legs syndrome)   . PNA (pneumonia)   . Respiratory failure   . Injury of conjunctiva and corneal abrasion of right eye without foreign body   . Cellulitis   . Migraine     Past Surgical History  Procedure Date  . Cholecystectomy   . Combined kidney-pancreas transplant   . Thyroglobulin     x 7    History reviewed. No pertinent family history.  History  Substance Use Topics  . Smoking status: Never Smoker   .  Smokeless tobacco: Never Used  . Alcohol Use: No    OB History    Grav Para Term Preterm Abortions TAB SAB Ect Mult Living                  Review of Systems  Constitutional: Negative for fever.  HENT: Negative for neck pain.   Eyes: Positive for photophobia. Negative for discharge and visual disturbance.  Gastrointestinal: Positive for nausea and vomiting.  Genitourinary: Negative for dysuria.  Musculoskeletal: Negative for back pain.  Skin: Negative for wound.  Neurological: Positive for headaches.  Hematological: Negative for adenopathy.    Allergies  Flagyl; Morphine and related; Penicillins; and Tramadol  Home Medications   Current Outpatient Rx  Name Route Sig Dispense Refill  . ALBUTEROL SULFATE HFA 108 (90 BASE) MCG/ACT IN AERS Inhalation Inhale 2 puffs into the lungs every 4 (four) hours as needed. For wheezing.     Marland Kitchen AMLODIPINE BESYLATE-VALSARTAN 10-160 MG PO TABS Oral Take 1 tablet by mouth daily.      . ASPIRIN 81 MG PO TABS Oral Take 81 mg by mouth daily.      Marland Kitchen CALCIUM ACETATE 667 MG PO CAPS Oral Take 1,334-2,001 mg by mouth 3 (three) times daily with meals.     . CYCLOBENZAPRINE HCL 5 MG PO TABS Oral Take 1 tablet by mouth Three times daily as needed. For muscle spasms.    Marland Kitchen LANTUS SOLOSTAR 100 UNIT/ML [place] SOLN Subcutaneous Inject 7 Units into the skin 2 (two) times  daily.     Marland Kitchen MYCOPHENOLATE MOFETIL 250 MG PO CAPS Oral Take 250 mg by mouth 2 (two) times daily.      Marland Kitchen NOVOLOG FLEXPEN 100 UNIT/ML  SOLN Subcutaneous Inject 3 Units into the skin 3 (three) times daily as needed. As directed. Per sliding scale.  >170= 2 units (on top of the three) >200= 3 units (on top of the three) >300= 4 units (on top of the three) >400= 5 units (on top of the three) >500= call MD Only uses if she eats.    Marland Kitchen PANTOPRAZOLE SODIUM 40 MG PO TBEC Oral Take 1 tablet by mouth Every morning.    Marland Kitchen PREDNISONE 5 MG PO TABS Oral Take 1 tablet by mouth daily.    Marland Kitchen PROMETHAZINE HCL 25 MG  PO TABS Oral Take 25 mg by mouth 2 (two) times daily as needed. For nausea.    Marland Kitchen TACROLIMUS 1 MG PO CAPS Oral Take 1 mg by mouth 2 (two) times daily.      . TOPROL XL 100 MG PO TB24 Oral Take 1 tablet by mouth daily.    Marland Kitchen ZOLPIDEM TARTRATE 10 MG PO TABS Oral Take 1 tablet by mouth at bedtime as needed. For sleep.      BP 137/85  Pulse 78  Temp(Src) 98.4 F (36.9 C) (Oral)  Resp 16  Ht 5\' 3"  (1.6 m)  Wt 145 lb (65.772 kg)  BMI 25.69 kg/m2  SpO2 99%  LMP 04/15/2011  Physical Exam  Nursing note and vitals reviewed. Constitutional: She is oriented to person, place, and time. She appears well-developed and well-nourished.  HENT:  Head: Normocephalic and atraumatic.  Eyes: Pupils are equal, round, and reactive to light. Right eye exhibits no discharge. Left eye exhibits no discharge.  Neck: Normal range of motion. Neck supple.       No meningeal signs  Cardiovascular: Normal rate and regular rhythm.  Exam reveals no gallop and no friction rub.   No murmur heard. Pulmonary/Chest: Effort normal and breath sounds normal. No respiratory distress. She has no wheezes.  Abdominal: Soft. There is no tenderness. There is no rebound and no guarding.  Musculoskeletal: Normal range of motion.  Neurological: She is alert and oriented to person, place, and time. She has normal strength. No cranial nerve deficit or sensory deficit. Gait normal.  Skin: Skin is warm and dry. No rash noted.  Psychiatric: She has a normal mood and affect.    ED Course  Procedures (including critical care time)  Labs Reviewed - No data to display No results found.   1. Headache     10:45 AM Patient seen and examined.  10:45 AM Patient was discussed with Gwyneth Sprout, MD  Will give HA cocktail. Will hold on Decadron given transplant.  11:58 AM patient feeling improved. Patient states she is ready for discharge home. Will discharge to home with pain medicine. I discussed the difficulty with using this pain  medicine for headaches and the potential for rebound headaches.  11:59 AM Patient counseled on use of narcotic pain medications. Counseled not to combine these medications with others containing tylenol. Urged not to drink alcohol, drive, or perform any other activities that requires focus while taking these medications. The patient verbalizes understanding and agrees with the plan.   MDM  Patient with typical migraine symptoms. Do not suspect complications with dialysis or kidney disease. Improved in emergency department.        Eustace Moore [place], [place] 05/13/11 [phone]

## 2011-05-13 NOTE — ED Provider Notes (Signed)
Medical screening examination/treatment/procedure(s) were performed by non-physician practitioner and as supervising physician I was immediately available for consultation/collaboration.   Gwyneth Sprout, MD 05/13/11 1534

## 2011-05-13 NOTE — ED Notes (Signed)
Pt c/o frontal HA x several days since oxycodone ran out; pt sts hx of similar in past; pt sts was here last night and left due to wait time

## 2011-05-27 IMAGING — CR DG CHEST 2V
2 series · 2 of 2 positions shown · non-contrast
Comparison: [HOSPITAL] chest x-ray 01/12/2009.

CLINICAL DATA: Cough, cold symptoms, shortness breath, intermittent
fever.

CHEST - 2 VIEW

[w chest pa]
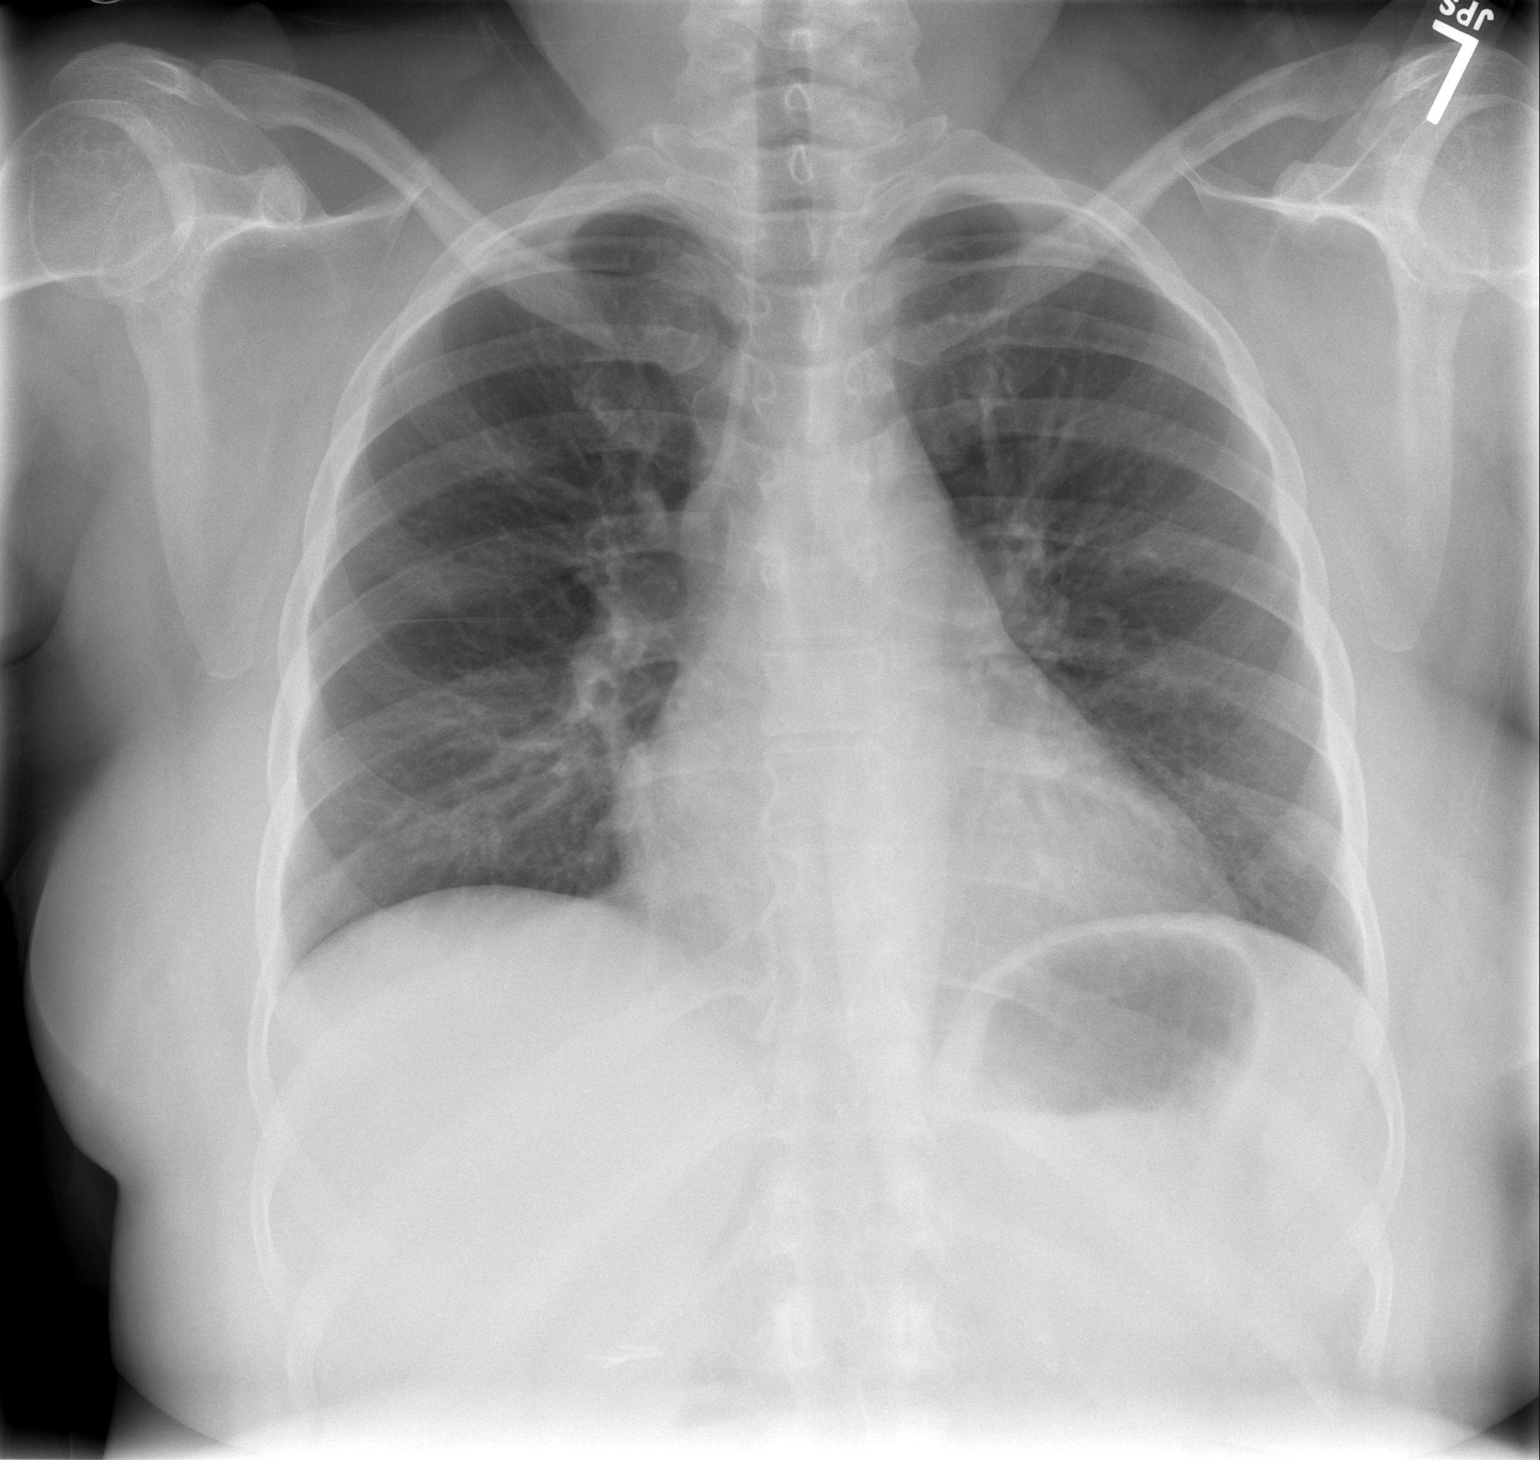

[w chest lat]
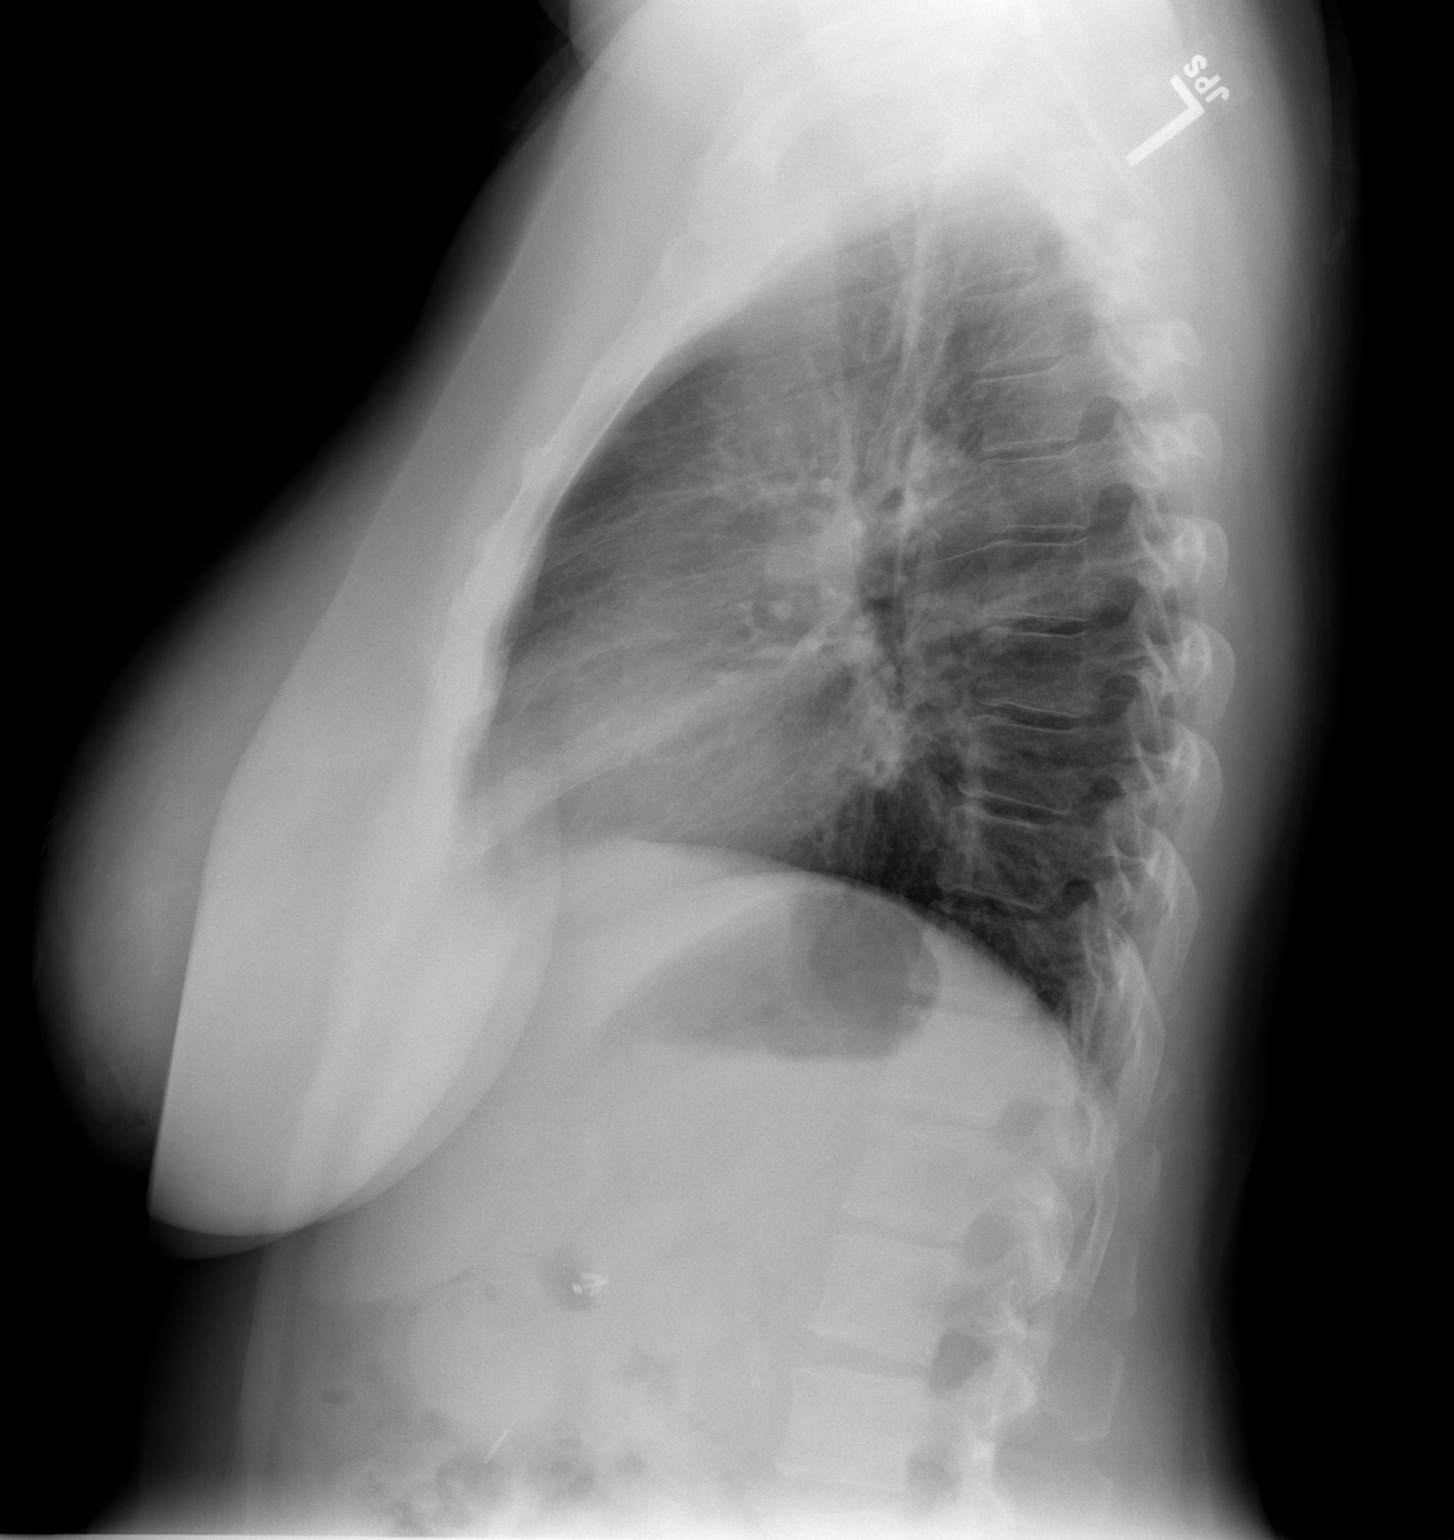

[2 of 2 positions shown; findings below may reference images not displayed]

FINDINGS: Interval removal previous dual lumen right IJ central
venous catheter without pneumothorax seen.  Low lung volumes noted
with clear lungs.  Heart size is upper limits of normal for degree
of inspiration.  Mediastinum, hila, pleura and osseous structures
are stable and unremarkable.
IMPRESSION: 1.  Interval removal previous central venous catheter without
pneumothorax.
2.  Persistent low lung volumes.
3.  No interval active cardiopulmonary disease.

## 2011-05-31 IMAGING — CR DG [PERSON_NAME]/EXT/UNI/OR LEFT
1 series · 1 of 1 positions shown · non-contrast
Comparison: None

CLINICAL DATA: Thrombectomy.

LEFT EXTREMITY ARTERIOGRAPHY

[view not recorded]
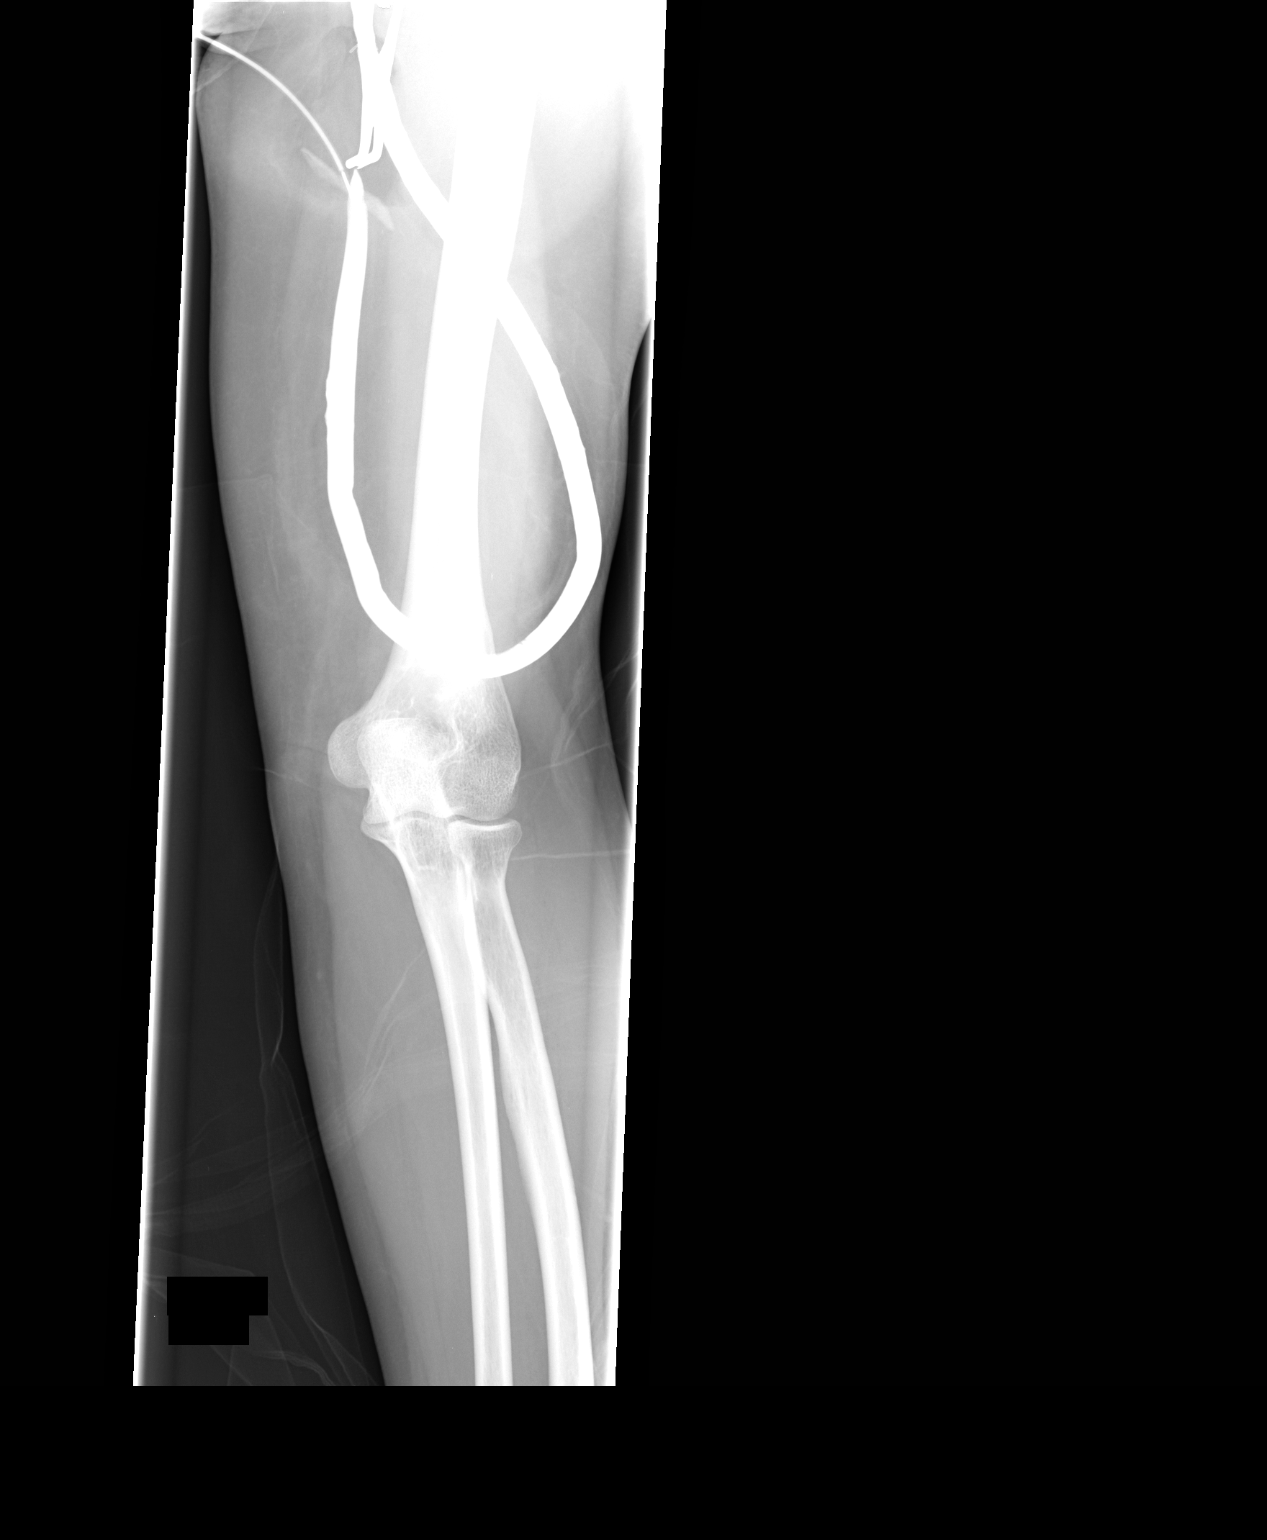

[1 of 1 positions shown; findings below may reference images not displayed]

FINDINGS: The single intraoperative images submitted from
shuntogram.  This demonstrates a loop graft within the upper left
arm.  Arterial anastomosis or outflow venous structures are not
visualized.
IMPRESSION: Patent loop graft.

## 2011-06-19 ENCOUNTER — Telehealth: Payer: Self-pay | Admitting: Internal Medicine

## 2011-06-19 MED ORDER — AMLODIPINE BESYLATE-VALSARTAN 10-160 MG PO TABS: 1.0000 | ORAL_TABLET | Freq: Every day | ORAL | Status: DC

## 2011-06-19 NOTE — Telephone Encounter (Signed)
I spoke with pt and advised her we did not have any samples. Pt states she then needed an rx sent to [facility] on [address]. Rx has been sent and nothing further was needed

## 2011-06-27 ENCOUNTER — Ambulatory Visit: Payer: Medicare Other | Admitting: Internal Medicine

## 2011-06-28 ENCOUNTER — Encounter: Payer: Medicare Other | Admitting: Internal Medicine

## 2011-06-28 NOTE — Patient Instructions (Signed)
Add pepcid 20mg one at bedtime and continue the protonix 40 mg Take 30-60 min before first meal of the day   GERD (REFLUX)  is an extremely common cause of respiratory symptoms, many times with no significant heartburn at all.    It can be treated with medication, but also with lifestyle changes including avoidance of late meals, excessive alcohol, smoking cessation, and avoid fatty foods, chocolate, peppermint, colas, red wine, and acidic juices such as orange juice.  NO MINT OR MENTHOL PRODUCTS SO NO COUGH DROPS  USE SUGARLESS CANDY INSTEAD (jolley ranchers or Stover's)  NO OIL BASED VITAMINS - use powdered substitutes.   Only use the proiare if you can't catch your breath  Use delsym for cough  Please schedule a follow up office visit in 6 weeks, call sooner if needed   .  

## 2011-06-28 NOTE — Progress Notes (Signed)
  Subjective:    Patient ID: [name], female    DOB: 04-09-1971, 41 y.o.   MRN: [id number]  HPI  11 yobf never smoker never asthma on HD referred by Dr Caryn Section for  Chronic cough and sob in Oct 2012  03/23/2011 Initial pulmonary office eval  on ACEI  cc cough x 6 months admitted twice with dx of pneumonia but cough never resolved worse when lie down does not tend to wake up mostly dry. 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     Assoc with chest discomfort worsened with coughing. Mild assoc nasal congestion/ sneezing but no purulent secretions, sinus pain, tooth problems, overt hb. Better only with narcotics. No previous hx of seasonal rhinitis overt HB or allergies/ asthma.  rec Stop lisinopril and norvasc Start exforge 10/160 one daily  GERD (REFLUX)   04/23/2011 f/u ov/[name] back on ace x one week cc patient states having pain up under her right breast productive of minimal white mucus, slt feverish over weekend with assoc wheezing,  Better on day of ov s rx better with albuterol.   No overt sinus or hb symptoms rec Add pepcid 20mg  one at bedtime and continue the protonix 40 mg Take 30-60 min before first meal of the day  GERD diet Only use the proiare if you can't catch your breath Use delsym for cough     06/28/2011 f/u ov/[name] cc   Sleeping ok without nocturnal  or early am exacerbation  of respiratory  c/o's or need for noct saba. Also denies any obvious fluctuation of symptoms with weather or environmental changes or other aggravating or alleviating factors except as outlined above  ROS  At present neg for  any significant sore throat, dysphagia, itching, sneezing,  nasal congestion or excess/ purulent secretions,  fever, chills, sweats, unintended wt loss, pleuritic or exertional cp, hempoptysis, orthopnea pnd or leg swelling.  Also denies presyncope, palpitations, heartburn, abdominal pain, nausea, vomiting, diarrhea  or  change in bowel or urinary habits, dysuria,hematuria,  rash, arthralgias, visual complaints, headache, numbness weakness or ataxia.             Objective:   Physical Exam  Still  hoarse pleasant mod amb obese bf nad with minimal pseudowheeze  Wt 160 03/23/11 >  161 04/23/2011 > 06/28/2011   HEENT: nl dentition, turbinates, and orophanx. Nl external ear canals without cough reflex   NECK :  without JVD/Nodes/TM/ nl carotid upstrokes bilaterally   LUNGS: no acc muscle use, completley  clear to A and P bilaterally without cough on insp or exp maneuvers   CV:  RRR  no s3 or murmur or increase in P2, no edema   ABD:  soft and nontender with nl excursion in the supine position. No bruits or organomegaly, bowel sounds nl  MS:  warm without deformities, calf tenderness, cyanosis or clubbing      cxr 02/27/11 Comparison: February 24, 2011  Findings: There is cardiomegaly and mild pulmonary edema. No gross  pleural effusions or focal infiltrates. The right IJ central line  tip is at the superior right atrial level. No pneumothorax.  IMPRESSION:  Mild pulmonary edema.     Assessment & Plan:

## 2011-07-14 ENCOUNTER — Encounter (HOSPITAL_COMMUNITY): Payer: Self-pay | Admitting: Emergency Medicine

## 2011-07-14 ENCOUNTER — Emergency Department (HOSPITAL_COMMUNITY): Payer: Medicare Other

## 2011-07-14 ENCOUNTER — Inpatient Hospital Stay (HOSPITAL_COMMUNITY)
Admission: EM | Admit: 2011-07-14 | Discharge: 2011-07-17 | DRG: 391 | Disposition: A | Discharge status: Home / Self Care | Payer: Medicare Other | Attending: Internal Medicine | Admitting: Internal Medicine

## 2011-07-14 DIAGNOSIS — G2581 Restless legs syndrome: Secondary | ICD-10-CM | POA: Diagnosis present

## 2011-07-14 DIAGNOSIS — Z9483 Pancreas transplant status: Secondary | ICD-10-CM

## 2011-07-14 DIAGNOSIS — K219 Gastro-esophageal reflux disease without esophagitis: Secondary | ICD-10-CM | POA: Diagnosis present

## 2011-07-14 DIAGNOSIS — Z7982 Long term (current) use of aspirin: Secondary | ICD-10-CM

## 2011-07-14 DIAGNOSIS — Z880 Allergy status to penicillin: Secondary | ICD-10-CM

## 2011-07-14 DIAGNOSIS — I10 Essential (primary) hypertension: Secondary | ICD-10-CM | POA: Diagnosis present

## 2011-07-14 DIAGNOSIS — E109 Type 1 diabetes mellitus without complications: Secondary | ICD-10-CM | POA: Diagnosis present

## 2011-07-14 DIAGNOSIS — R079 Chest pain, unspecified: Secondary | ICD-10-CM | POA: Diagnosis present

## 2011-07-14 DIAGNOSIS — A088 Other specified intestinal infections: Principal | ICD-10-CM | POA: Diagnosis present

## 2011-07-14 DIAGNOSIS — E785 Hyperlipidemia, unspecified: Secondary | ICD-10-CM | POA: Diagnosis present

## 2011-07-14 DIAGNOSIS — E669 Obesity, unspecified: Secondary | ICD-10-CM | POA: Diagnosis present

## 2011-07-14 DIAGNOSIS — N186 End stage renal disease: Secondary | ICD-10-CM | POA: Diagnosis present

## 2011-07-14 DIAGNOSIS — IMO0002 Reserved for concepts with insufficient information to code with codable children: Secondary | ICD-10-CM

## 2011-07-14 DIAGNOSIS — E875 Hyperkalemia: Secondary | ICD-10-CM | POA: Diagnosis present

## 2011-07-14 DIAGNOSIS — R112 Nausea with vomiting, unspecified: Secondary | ICD-10-CM

## 2011-07-14 DIAGNOSIS — Z940 Kidney transplant status: Secondary | ICD-10-CM

## 2011-07-14 DIAGNOSIS — E860 Dehydration: Secondary | ICD-10-CM

## 2011-07-14 DIAGNOSIS — I120 Hypertensive chronic kidney disease with stage 5 chronic kidney disease or end stage renal disease: Secondary | ICD-10-CM | POA: Diagnosis present

## 2011-07-14 DIAGNOSIS — Z888 Allergy status to other drugs, medicaments and biological substances status: Secondary | ICD-10-CM

## 2011-07-14 DIAGNOSIS — K529 Noninfective gastroenteritis and colitis, unspecified: Secondary | ICD-10-CM | POA: Diagnosis present

## 2011-07-14 DIAGNOSIS — R0789 Other chest pain: Secondary | ICD-10-CM | POA: Diagnosis present

## 2011-07-14 DIAGNOSIS — Z79899 Other long term (current) drug therapy: Secondary | ICD-10-CM

## 2011-07-14 DIAGNOSIS — Z794 Long term (current) use of insulin: Secondary | ICD-10-CM

## 2011-07-14 DIAGNOSIS — D649 Anemia, unspecified: Secondary | ICD-10-CM | POA: Diagnosis present

## 2011-07-14 DIAGNOSIS — R05 Cough: Secondary | ICD-10-CM

## 2011-07-14 DIAGNOSIS — Z23 Encounter for immunization: Secondary | ICD-10-CM

## 2011-07-14 DIAGNOSIS — R059 Cough, unspecified: Secondary | ICD-10-CM | POA: Diagnosis present

## 2011-07-14 LAB — BASIC METABOLIC PANEL
BUN: 79 mg/dL — ABNORMAL HIGH (ref 6–23)
CO2: 17 mEq/L — ABNORMAL LOW (ref 19–32)
Calcium: 7.7 mg/dL — ABNORMAL LOW (ref 8.4–10.5)
Chloride: 91 mEq/L — ABNORMAL LOW (ref 96–112)
Creatinine, Ser: 12.34 mg/dL — ABNORMAL HIGH (ref 0.50–1.10)
GFR calc Af Amer: 4 mL/min — ABNORMAL LOW (ref >90)
GFR calc non Af Amer: 3 mL/min — ABNORMAL LOW (ref >90)
Glucose, Bld: 347 mg/dL — ABNORMAL HIGH (ref 70–99)
Potassium: 5.4 mEq/L — ABNORMAL HIGH (ref 3.5–5.1)
Sodium: 131 mEq/L — ABNORMAL LOW (ref 135–145)

## 2011-07-14 LAB — CBC
HCT: 31.4 % — ABNORMAL LOW (ref 36.0–46.0)
Hemoglobin: 10.3 g/dL — ABNORMAL LOW (ref 12.0–15.0)
MCH: 30.7 pg (ref 26.0–34.0)
MCHC: 32.8 g/dL (ref 30.0–36.0)
MCV: 93.7 fL (ref 78.0–100.0)
Platelets: 213 10*3/uL (ref 150–400)
RBC: 3.35 MIL/uL — ABNORMAL LOW (ref 3.87–5.11)
RDW: 16.1 % — ABNORMAL HIGH (ref 11.5–15.5)
WBC: 7.2 10*3/uL (ref 4.0–10.5)

## 2011-07-14 LAB — GLUCOSE, CAPILLARY: Glucose-Capillary: 221 mg/dL — ABNORMAL HIGH (ref 70–99)

## 2011-07-14 MED ORDER — SODIUM CHLORIDE 0.9 % IV SOLN: INTRAVENOUS | Status: DC

## 2011-07-14 MED ORDER — SODIUM CHLORIDE 0.9 % IV BOLUS (SEPSIS)
500.0000 mL | Freq: Once | INTRAVENOUS | Status: AC
  Administered 2011-07-14: 500 mL via INTRAVENOUS

## 2011-07-14 MED ORDER — ONDANSETRON HCL 4 MG/2ML IJ SOLN
4.0000 mg | Freq: Once | INTRAMUSCULAR | Status: AC
  Administered 2011-07-14: 4 mg via INTRAVENOUS
  Filled 2011-07-14: qty 2

## 2011-07-14 MED ORDER — ALBUTEROL SULFATE (5 MG/ML) 0.5% IN NEBU
5.0000 mg | INHALATION_SOLUTION | Freq: Once | RESPIRATORY_TRACT | Status: AC
  Administered 2011-07-14: 5 mg via RESPIRATORY_TRACT
  Filled 2011-07-14: qty 1

## 2011-07-14 MED ORDER — ONDANSETRON HCL 4 MG/2ML IJ SOLN
4.0000 mg | Freq: Three times a day (TID) | INTRAMUSCULAR | Status: AC | PRN
  Administered 2011-07-15: 4 mg via INTRAVENOUS
  Filled 2011-07-14 (×2): qty 2

## 2011-07-14 MED ORDER — IPRATROPIUM BROMIDE 0.02 % IN SOLN
0.5000 mg | Freq: Once | RESPIRATORY_TRACT | Status: AC
  Administered 2011-07-14: 0.5 mg via RESPIRATORY_TRACT
  Filled 2011-07-14: qty 2.5

## 2011-07-14 MED ORDER — HYDROMORPHONE HCL PF 1 MG/ML IJ SOLN
1.0000 mg | INTRAMUSCULAR | Status: AC | PRN
  Administered 2011-07-14 – 2011-07-15 (×3): 1 mg via INTRAVENOUS
  Filled 2011-07-14 (×3): qty 1

## 2011-07-14 MED ORDER — HYDROMORPHONE HCL PF 1 MG/ML IJ SOLN
0.5000 mg | Freq: Once | INTRAMUSCULAR | Status: AC
  Administered 2011-07-14: 0.5 mg via INTRAVENOUS
  Filled 2011-07-14: qty 1

## 2011-07-14 NOTE — ED Notes (Signed)
Attempted to call report second time nurse still unable to take at this time.

## 2011-07-14 NOTE — ED Notes (Signed)
Admitting MD at bedside.

## 2011-07-14 NOTE — ED Notes (Signed)
Pt complaining of SOB and generalized body pain. Pt admits to going to dialysis and the site blowing after an hour and then having to return Wed to complete dialysis. Pt was suppose to return for regular dialysis on Thursday and did not. Pt states last dialysis treatment was wed.

## 2011-07-14 NOTE — ED Notes (Signed)
Pt to ED with multi. Complaints, non-productive cough, chills, headache, nausea with vomiting and diarrhea

## 2011-07-14 NOTE — Procedures (Signed)
PT lost IV access and needed an IV prior to admit to the floor. I was asked to attempt IV access. Consent obtained. Timeout performed.  Sterile prep to R EJ site. 18 G angiocath inserted without difficulty and had immediate return of blood and able to flush with sterile saline.  Dressing placed. PT tolerated well.

## 2011-07-14 NOTE — ED Notes (Signed)
I gave the patient two warm blankets. 

## 2011-07-14 NOTE — ED Notes (Signed)
Flow manager contacted to contact admitting MD to inform that Pt has Bed assignment.

## 2011-07-14 NOTE — ED Notes (Signed)
Attempted to call report. Nurse unable to take at this time.

## 2011-07-14 NOTE — ED Provider Notes (Signed)
History     CSN: [id number]  Arrival date & time 07/14/11  1540   First MD Initiated Contact with Patient 07/14/11 1601      Chief Complaint  Patient presents with  . Cough    (Consider location/radiation/quality/duration/timing/severity/associated sxs/prior treatment) Patient is a 41 y.o. female presenting with cough. The history is provided by the patient.  Cough   the patient reports nausea vomiting diarrhea times one week.  She also reports chills and cough.  She is on dialysis she dialyzes Tuesday Thursday subsided.  She felt so bad today she did not make dialysis.  She reports she no longer makes urine.  She denies abdominal pain.  She does report high blood sugars over the past week.  She reports her blood sugars have been anywhere from 200-800.  She denies significant shortness of breath.  She denies lower [place] edema.  Past Medical History  Diagnosis Date  . Diabetes mellitus   . Hypertension   . Renal insufficiency   . Hyperlipidemia   . Alopecia   . GERD (gastroesophageal reflux disease)   . Obesity   . Iron deficiency   . ESRD (end stage renal disease)   . RLS (restless legs syndrome)   . PNA (pneumonia)   . Respiratory failure   . Injury of conjunctiva and corneal abrasion of right eye without foreign body   . Cellulitis   . Migraine     Past Surgical History  Procedure Date  . Cholecystectomy   . Combined kidney-pancreas transplant   . Thyroglobulin     x 7    No family history on file.  History  Substance Use Topics  . Smoking status: Never Smoker   . Smokeless tobacco: Never Used  . Alcohol Use: No    OB History    Grav Para Term Preterm Abortions TAB SAB Ect Mult Living                  Review of Systems  Respiratory: Positive for cough.   All other systems reviewed and are negative.    Allergies  Flagyl; Morphine and related; Penicillins; and Tramadol  Home Medications   Current Outpatient Rx  Name Route Sig Dispense Refill   . ALBUTEROL SULFATE HFA 108 (90 BASE) MCG/ACT IN AERS Inhalation Inhale 2 puffs into the lungs every 4 (four) hours as needed. For wheezing.     Marland Kitchen AMLODIPINE BESYLATE-VALSARTAN 10-160 MG PO TABS Oral Take 1 tablet by mouth daily.    . ASPIRIN 81 MG PO TABS Oral Take 81 mg by mouth daily.      Marland Kitchen CALCIUM ACETATE 667 MG PO CAPS Oral Take 1,334-2,001 mg by mouth 3 (three) times daily with meals. Dosage depends on meal consumed    . CLONAZEPAM 0.5 MG PO TABS Oral Take 1 mg by mouth at bedtime. For anxiety    . CYCLOBENZAPRINE HCL 5 MG PO TABS Oral Take 1 tablet by mouth Three times daily as needed. For muscle spasms.    Marland Kitchen LANTUS SOLOSTAR 100 UNIT/ML [place] SOLN Subcutaneous Inject 7 Units into the skin 2 (two) times daily.     Marland Kitchen MYCOPHENOLATE MOFETIL 250 MG PO CAPS Oral Take 250 mg by mouth 2 (two) times daily.     Marland Kitchen NOVOLOG FLEXPEN 100 UNIT/ML [place] SOLN Subcutaneous Inject 3 Units into the skin 3 (three) times daily as needed. As directed. Per sliding scale.  >170= 2 units (on top of the three) >200= 3 units (on top  of the three) >300= 4 units (on top of the three) >400= 5 units (on top of the three) >500= call MD Only uses if she eats.    Marland Kitchen PANTOPRAZOLE SODIUM 40 MG PO TBEC Oral Take 1 tablet by mouth Every morning.    Marland Kitchen PREDNISONE 5 MG PO TABS Oral Take 1 tablet by mouth daily.    Marland Kitchen PROMETHAZINE HCL 25 MG PO TABS Oral Take 25 mg by mouth 2 (two) times daily as needed. For nausea.    Marland Kitchen TACROLIMUS 1 MG PO CAPS Oral Take 1 mg by mouth 2 (two) times daily.      Marland Kitchen ZOLPIDEM TARTRATE 10 MG PO TABS Oral Take 1 tablet by mouth at bedtime. For sleep.      BP 182/88  Pulse 104  Temp(Src) 98.3 F (36.8 C) (Oral)  Resp 20  SpO2 98%  LMP 07/13/2011  Physical Exam  Nursing note and vitals reviewed. Constitutional: She is oriented to person, place, and time. She appears well-developed and well-nourished. No distress.  HENT:  Head: Normocephalic and atraumatic.  Eyes: EOM are normal.  Neck: Normal range  of motion.  Cardiovascular: Normal rate, regular rhythm and normal heart sounds.   Pulmonary/Chest: Effort normal and breath sounds normal.  Abdominal: Soft. She exhibits no distension. There is no tenderness.  Musculoskeletal: Normal range of motion.  Neurological: She is alert and oriented to person, place, and time.  Skin: Skin is warm and dry.  Psychiatric: She has a normal mood and affect. Judgment normal.    ED Course  Procedures (including critical care time)  Labs Reviewed  CBC - Abnormal; Notable for the following:    RBC 3.35 (*)    Hemoglobin 10.3 (*)    HCT 31.4 (*)    RDW 16.1 (*)    All other components within normal limits  BASIC METABOLIC PANEL - Abnormal; Notable for the following:    Sodium 131 (*)    Potassium 5.4 (*)    Chloride 91 (*)    CO2 17 (*)    Glucose, Bld 347 (*)    BUN 79 (*)    Creatinine, Ser 12.34 (*)    Calcium 7.7 (*)    GFR calc non Af Amer 3 (*)    GFR calc Af Amer 4 (*)    All other components within normal limits  GLUCOSE, CAPILLARY - Abnormal; Notable for the following:    Glucose-Capillary 221 (*)    All other components within normal limits   Dg Chest 2 View  07/14/2011  *RADIOLOGY REPORT*  Clinical Data: Cough, bodyaches, and fever  CHEST - 2 VIEW  Comparison: 02/27/2011  Findings: Stable cardiomegaly.  Pulmonary vascularity is within normal limits.  The trachea is midline.  The lungs are clear. Negative for edema, focal airspace disease, or pleural effusion. The bony thorax is unremarkable.  IMPRESSION: Stable cardiomegaly.  No acute findings in the chest.  Original Report Authenticated By: Britta Mccreedy, M.D.   I personally reviewed the x-ray  1. Nausea vomiting and diarrhea   2. Dehydration   3. End stage renal disease       MDM  The patient's symptoms sound viral in nature.  She has been hydrated in the ER her pain is being treated.  She continues to complain of significant myalgias.  She is a mild elevation in her  potassium at 5.4.  I think she is probably intravascularly depleted however I'm  concerned about giving her additional fluids and then  discharging her home being that she missed dialysis today.  I'll contact the [facility] hospitalist for admission        Lyanne Co, MD 07/14/11 2030

## 2011-07-14 NOTE — ED Notes (Signed)
Unable to start iv access, calling iv team

## 2011-07-14 NOTE — ED Notes (Signed)
Iv team at bedside  

## 2011-07-14 NOTE — H&P (Signed)
PCP:   Gwynneth Aliment, MD, MD  Renal:  [facility] Eliott Nine, MD   Chief Complaint:  Nausea vomiting fever  HPI: [name] is a 41 y.o. 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  has a past medical history of Diabetes mellitus; Hypertension; Renal insufficiency; Hyperlipidemia; Alopecia; GERD (gastroesophageal reflux disease); Obesity; Iron deficiency; ESRD (end stage renal disease); RLS (restless legs syndrome); PNA (pneumonia); Respiratory failure; Injury of conjunctiva and corneal abrasion of right eye without foreign body; Cellulitis; and Migraine.   Presented with  1-2 week of cough that has been worsening, fevers that are controlled with tylenol for past 2 days, nausea, diarrhea. Her blood sugars has been also out of control ranging from 200 to 500. Of note the cough component sound very chronic since she has been seen for the same in recent past by Dr. Sherene Sires. Patient also volunteered chest pain that just started and somewhat vague in presentation.   Patient states she felt so bad she did not have her HD done today. Usually she gets hemodialyzed on  Tuesday Thursday and Saturday through a fistula in her right arm. Has been working very well is having troubles. She had had a fistula in the left arm in the past that have had failed. She states she does have hard time in access and requires numerous declines in the past. I have discussed her case with Dr. Arlean Hopping who is okay to acces either below her fistula using hand or  the left hand for now if her Left EJ fails.     Review of Systems:    Pertinent positives include:Fevers, chills, fatigue. chest pain,nausea, vomiting, diarrhea,  shortness of breath at rest.   Constitutional:  No weight loss, night sweats, HEENT:  No headaches, Difficulty  swallowing,Tooth/dental problems,Sore throat,  No sneezing, itching, ear ache, nasal congestion, post nasal drip,  Cardio-vascular:  No  Orthopnea, PND, anasarca, dizziness, palpitations.no Bilateral lower extremity swelling  GI:  No heartburn, indigestion, abdominal pain, change in bowel habits, loss of appetite, melena, blood in stool, hematoemesis Resp:  no No dyspnea on exertion, No excess mucus, no productive cough, No non-productive cough, No coughing up of blood.No change in color of mucus.No wheezing.No chest wall deformity  Skin:  no rash or lesions.  GU:  no dysuria, change in color of urine, no urgency or frequency. No flank pain.  Musculoskeletal:  No joint pain or swelling. No decreased range of motion. No back pain.  Psych:  No change in mood or affect. No depression or anxiety. No memory loss.  Neuro: no localizing neurological complaints, no tingling, no weakness, no double vision, no gait abnormality, no slurred speech   Otherwise ROS are negative except for above, 10 systems were reviewed  Past Medical History: Past Medical History  Diagnosis Date  . Diabetes mellitus   . Hypertension   . Renal insufficiency   . Hyperlipidemia   . Alopecia   . GERD (gastroesophageal reflux disease)   . Obesity   . Iron deficiency   . ESRD (end stage renal disease)   . RLS (restless legs syndrome)   . PNA (pneumonia)   . Respiratory failure   . Injury of conjunctiva and corneal abrasion of right eye without foreign body   . Cellulitis   . Migraine    Past Surgical  History  Procedure Date  . Cholecystectomy   . Combined kidney-pancreas transplant   . Thyroglobulin     x 7     Medications: Prior to Admission medications   Medication Sig Start Date End Date Taking? Authorizing Provider  albuterol (PROVENTIL HFA;VENTOLIN HFA) 108 (90 BASE) MCG/ACT inhaler Inhale 2 puffs into the lungs every 4 (four) hours as needed. For wheezing.  04/23/11 04/22/12 Yes Sandrea Hughs, MD    amLODipine-valsartan (EXFORGE) 10-160 MG per tablet Take 1 tablet by mouth daily. 06/19/11 06/18/12 Yes Sandrea Hughs, MD  aspirin 81 MG tablet Take 81 mg by mouth daily.     Yes Historical Provider, MD  calcium acetate (PHOSLO) 667 MG capsule Take 1,334-2,001 mg by mouth 3 (three) times daily with meals. Dosage depends on meal consumed 03/12/11  Yes Historical Provider, MD  clonazePAM (KLONOPIN) 0.5 MG tablet Take 1 mg by mouth at bedtime. For anxiety   Yes Historical Provider, MD  cyclobenzaprine (FLEXERIL) 5 MG tablet Take 1 tablet by mouth Three times daily as needed. For muscle spasms. 01/04/11  Yes Historical Provider, MD  LANTUS SOLOSTAR 100 UNIT/ML injection Inject 7 Units into the skin 2 (two) times daily.  02/14/11  Yes Historical Provider, MD  meclizine (ANTIVERT) 25 MG tablet Take 25 mg by mouth 3 (three) times daily.   Yes Historical Provider, MD  metoprolol succinate (TOPROL-XL) 100 MG 24 hr tablet Take 100 mg by mouth at bedtime. Do not take the night before dialysis. Dialysis on Tues, Thurs & Sat.   Yes Historical Provider, MD  mycophenolate (CELLCEPT) 250 MG capsule Take 250 mg by mouth 2 (two) times daily.    Yes Historical Provider, MD  NOVOLOG FLEXPEN 100 UNIT/ML injection Inject 3 Units into the skin 3 (three) times daily as needed. As directed. Per sliding scale.  >170= 2 units (on top of the three) >200= 3 units (on top of the three) >300= 4 units (on top of the three) >400= 5 units (on top of the three) >500= call MD Only uses if she eats. 02/14/11  Yes Historical Provider, MD  pantoprazole (PROTONIX) 40 MG tablet Take 1 tablet by mouth Every morning. 03/12/11  Yes Historical Provider, MD  predniSONE (DELTASONE) 5 MG tablet Take 1 tablet by mouth daily. 01/21/11  Yes Historical Provider, MD  promethazine (PHENERGAN) 25 MG tablet Take 25 mg by mouth 2 (two) times daily as needed. For nausea. 03/21/11  Yes Historical Provider, MD  tacrolimus (PROGRAF) 1 MG capsule Take 1 mg by  mouth 2 (two) times daily.     Yes Historical Provider, MD  zolpidem (AMBIEN) 10 MG tablet Take 1 tablet by mouth at bedtime. For sleep. 03/14/11  Yes Historical Provider, MD    Allergies:   Allergies  Allergen Reactions  . Flagyl (Metronidazole Hcl) Other (See Comments)    unknown  . Morphine And Related Nausea And Vomiting  . Penicillins     rash  . Tramadol Other (See Comments)    unknown    Social History:  Ambulatory (self) Lives at home   reports that she has never smoked. She has never used smokeless tobacco. She reports that she does not drink alcohol or use illicit drugs.   Family History: family history is not on file.    Physical Exam: Patient Vitals for the past 24 hrs:  BP Temp Temp src Pulse Resp SpO2  07/14/11 2255 172/91 mmHg - - 104  18  97 %  07/14/11 1733 - - - - -  98 %  07/14/11 1547 182/88 mmHg 98.3 F (36.8 C) Oral 104  20  100 %    1. General:  in No Acute distress 2. Psychological: Alert and Oriented 3. Head/ENT:   Moist  Mucous Membranes                          Head Non traumatic, neck supple                          Normal  Dentition 4. SKIN: normal  Skin turgor,  Skin clean Dry and intact no rash 5. Heart: Regular rate and rhythm no Murmur, Rub or gallop 6. Lungs:  occasional  wheezes  no crackles   7. Abdomen: Soft, non-tender, Non distended obese  8. Lower extremities: no clubbing, cyanosis, or edema 9. Neurologically Grossly intact, moving all 4 extremities equally 10. MSK: Normal range of motion  body mass index is unknown because there is no height or weight on file.   Labs on Admission:   Basename 07/14/11 1700  NA 131*  K 5.4*  CL 91*  CO2 17*  GLUCOSE 347*  BUN 79*  CREATININE 12.34*  CALCIUM 7.7*  MG --  PHOS --   No results found for this basename: AST:2,ALT:2,ALKPHOS:2,BILITOT:2,PROT:2,ALBUMIN:2 in the last 72 hours No results found for this basename: LIPASE:2,AMYLASE:2 in the last 72 hours  Basename 07/14/11  1700  WBC 7.2  NEUTROABS --  HGB 10.3*  HCT 31.4*  MCV 93.7  PLT 213   No results found for this basename: CKTOTAL:3,CKMB:3,CKMBINDEX:3,TROPONINI:3 in the last 72 hours No results found for this basename: TSH,T4TOTAL,FREET3,T3FREE,THYROIDAB in the last 72 hours No results found for this basename: VITAMINB12:2,FOLATE:2,FERRITIN:2,TIBC:2,IRON:2,RETICCTPCT:2 in the last 72 hours Lab Results  Component Value Date   HGBA1C 11.9* 12/21/2010    The CrCl is unknown because both a height and weight (above a minimum accepted value) are required for this calculation. ABG    Component Value Date/Time   PHART 7.511* 07/21/2008 0513   HCO3 27.1* 12/04/2010 0135   TCO2 24 02/18/2011 1053   ACIDBASEDEF 2.0 11/21/2010 2302   O2SAT 82.0 12/04/2010 0135     Lab Results  Component Value Date   DDIMER 1.22* 02/27/2011     Other results:   Cultures:    Component Value Date/Time   SDES BLOOD HEMODIALYSIS FISTULA 02/27/2011 1312   SPECREQUEST BOTTLES DRAWN AEROBIC AND ANAEROBIC 10CC 02/27/2011 1312   CULT NO GROWTH 5 DAYS 02/27/2011 1312   REPTSTATUS 03/05/2011 FINAL 02/27/2011 1312       Radiological Exams on Admission: Dg Chest 2 View  07/14/2011  *RADIOLOGY REPORT*  Clinical Data: Cough, bodyaches, and fever  CHEST - 2 VIEW  Comparison: 02/27/2011  Findings: Stable cardiomegaly.  Pulmonary vascularity is within normal limits.  The trachea is midline.  The lungs are clear. Negative for edema, focal airspace disease, or pleural effusion. The bony thorax is unremarkable.  IMPRESSION: Stable cardiomegaly.  No acute findings in the chest.  Original Report Authenticated By: Britta Mccreedy, M.D.    Assessment/Plan   41 year old type 1 diabetes in fair history of end-stage renal disease status post failed kidney transplant now back on hemodialysis Tuesday Thursday Saturdays. He is still functioning pancreatic transplant but brittle diabetes. Currently with what seems to be viral gastroenteritis  Present on  Admission:  .Gastroenteritis, acute - given outbreak of Norovirus in the community this is possible etiology will put  on conduct the cautions. Give gentle IV rehydration being mindful of ESRD. Of not patient already got a liter in ED. At this point will run at [facility] only.  .Cough - this is a chronic problem will treat the Mucinex currently no evidence of pneumonia a chest x-ray  .Hypertension - continue home medications  .ESRD (end stage renal disease) - I have spoken to renal was aware of patient she'll likely need to be dialyzed tomorrow.  No immediate indication for hemodialysis tonight. She does have some evidence of metabolic acidosis. Will repeat BMET also would reevaluate her potassium  .Chest pain -  given risk factors will admit, monitor on telemetry, cycle cardiac enzymes, obtain serial ECG. Further risk stratify with lipid panel, hgA1C, obtain TSH. Make sure patient is on Aspirin. Further treatment based on the currently pending results.   HYPERKALEMIA - renal recommended kayexalate  Diabetes mellitus Will continue home Lantus and place on sliding scale Prophylaxis:hep [place] Protonix     [name] 07/14/2011, 11:06 PM

## 2011-07-15 ENCOUNTER — Other Ambulatory Visit: Payer: Self-pay

## 2011-07-15 ENCOUNTER — Observation Stay (HOSPITAL_COMMUNITY): Payer: Medicare Other

## 2011-07-15 ENCOUNTER — Encounter (HOSPITAL_COMMUNITY): Payer: Self-pay | Admitting: General Practice

## 2011-07-15 DIAGNOSIS — R079 Chest pain, unspecified: Secondary | ICD-10-CM | POA: Diagnosis present

## 2011-07-15 LAB — LIPID PANEL
HDL: 73 mg/dL (ref >39)
LDL Cholesterol: 69 mg/dL (ref 0–99)
Triglycerides: 121 mg/dL (ref <150)
VLDL: 24 mg/dL (ref 0–40)

## 2011-07-15 LAB — COMPREHENSIVE METABOLIC PANEL
ALT: 11 U/L (ref 0–35)
AST: 15 U/L (ref 0–37)
CO2: 18 mEq/L — ABNORMAL LOW (ref 19–32)
Chloride: 91 mEq/L — ABNORMAL LOW (ref 96–112)
Creatinine, Ser: 13.81 mg/dL — ABNORMAL HIGH (ref 0.50–1.10)
GFR calc Af Amer: 3 mL/min — ABNORMAL LOW (ref >90)
GFR calc non Af Amer: 3 mL/min — ABNORMAL LOW (ref >90)
Glucose, Bld: 202 mg/dL — ABNORMAL HIGH (ref 70–99)
Total Bilirubin: 0.2 mg/dL — ABNORMAL LOW (ref 0.3–1.2)

## 2011-07-15 LAB — CARDIAC PANEL(CRET KIN+CKTOT+MB+TROPI)
CK, MB: 1.5 ng/mL (ref 0.3–4.0)
CK, MB: 1.8 ng/mL (ref 0.3–4.0)
Relative Index: INVALID (ref 0.0–2.5)
Total CK: 102 U/L (ref 7–177)
Troponin I: <0.3 ng/mL (ref <0.30)

## 2011-07-15 LAB — GLUCOSE, CAPILLARY
Glucose-Capillary: 342 mg/dL — ABNORMAL HIGH (ref 70–99)
Glucose-Capillary: 60 mg/dL — ABNORMAL LOW (ref 70–99)
Glucose-Capillary: 63 mg/dL — ABNORMAL LOW (ref 70–99)

## 2011-07-15 LAB — CBC
HCT: 31.8 % — ABNORMAL LOW (ref 36.0–46.0)
Hemoglobin: 10.3 g/dL — ABNORMAL LOW (ref 12.0–15.0)
Hemoglobin: 10.2 g/dL — ABNORMAL LOW (ref 12.0–15.0)
MCH: 30.7 pg (ref 26.0–34.0)
MCHC: 32.4 g/dL (ref 30.0–36.0)
MCV: 93.6 fL (ref 78.0–100.0)
Platelets: 190 10*3/uL (ref 150–400)
RBC: 3.29 MIL/uL — ABNORMAL LOW (ref 3.87–5.11)
WBC: 7.8 10*3/uL (ref 4.0–10.5)

## 2011-07-15 LAB — PHOSPHORUS: Phosphorus: 6.9 mg/dL — ABNORMAL HIGH (ref 2.3–4.6)

## 2011-07-15 MED ORDER — ZOLPIDEM TARTRATE 5 MG PO TABS
10.0000 mg | ORAL_TABLET | Freq: Every day | ORAL | Status: DC
  Administered 2011-07-15 – 2011-07-16 (×2): 10 mg via ORAL
  Filled 2011-07-15 (×2): qty 2

## 2011-07-15 MED ORDER — AMLODIPINE BESYLATE 10 MG PO TABS
10.0000 mg | ORAL_TABLET | Freq: Every day | ORAL | Status: DC
  Administered 2011-07-15 – 2011-07-17 (×3): 10 mg via ORAL
  Filled 2011-07-15 (×3): qty 1

## 2011-07-15 MED ORDER — ACETAMINOPHEN 650 MG RE SUPP: 650.0000 mg | Freq: Four times a day (QID) | RECTAL | Status: DC | PRN

## 2011-07-15 MED ORDER — SODIUM CHLORIDE 0.9 % IV SOLN
INTRAVENOUS | Status: AC
  Administered 2011-07-15: 10 mL via INTRAVENOUS

## 2011-07-15 MED ORDER — ASPIRIN 81 MG PO CHEW
81.0000 mg | CHEWABLE_TABLET | Freq: Every day | ORAL | Status: DC
  Administered 2011-07-15 – 2011-07-17 (×3): 81 mg via ORAL
  Filled 2011-07-15 (×3): qty 1

## 2011-07-15 MED ORDER — CLONAZEPAM 1 MG PO TABS
1.0000 mg | ORAL_TABLET | Freq: Every day | ORAL | Status: DC
  Administered 2011-07-15 – 2011-07-16 (×2): 1 mg via ORAL
  Filled 2011-07-15 (×2): qty 1

## 2011-07-15 MED ORDER — MYCOPHENOLATE MOFETIL 250 MG PO CAPS
250.0000 mg | ORAL_CAPSULE | Freq: Two times a day (BID) | ORAL | Status: DC
  Administered 2011-07-15 – 2011-07-17 (×6): 250 mg via ORAL
  Filled 2011-07-15 (×7): qty 1

## 2011-07-15 MED ORDER — ONDANSETRON HCL 4 MG/2ML IJ SOLN
4.0000 mg | Freq: Four times a day (QID) | INTRAMUSCULAR | Status: DC | PRN
  Administered 2011-07-15 – 2011-07-17 (×2): 4 mg via INTRAVENOUS

## 2011-07-15 MED ORDER — MECLIZINE HCL 25 MG PO TABS
25.0000 mg | ORAL_TABLET | Freq: Three times a day (TID) | ORAL | Status: DC
  Administered 2011-07-15 – 2011-07-17 (×7): 25 mg via ORAL
  Filled 2011-07-15 (×9): qty 1

## 2011-07-15 MED ORDER — ACETAMINOPHEN 325 MG PO TABS
650.0000 mg | ORAL_TABLET | Freq: Four times a day (QID) | ORAL | Status: DC | PRN
  Administered 2011-07-16: 650 mg via ORAL
  Filled 2011-07-15: qty 2

## 2011-07-15 MED ORDER — SODIUM POLYSTYRENE SULFONATE 15 GM/60ML PO SUSP: 30.0000 g | Freq: Once | ORAL | Status: DC

## 2011-07-15 MED ORDER — OLMESARTAN MEDOXOMIL 20 MG PO TABS
20.0000 mg | ORAL_TABLET | Freq: Every day | ORAL | Status: DC
  Administered 2011-07-15 – 2011-07-17 (×3): 20 mg via ORAL
  Filled 2011-07-15 (×3): qty 1

## 2011-07-15 MED ORDER — CALCIUM ACETATE 667 MG PO CAPS
1334.0000 mg | ORAL_CAPSULE | Freq: Three times a day (TID) | ORAL | Status: DC
  Administered 2011-07-15 – 2011-07-17 (×6): 1334 mg via ORAL
  Filled 2011-07-15 (×10): qty 3

## 2011-07-15 MED ORDER — OXYCODONE HCL 5 MG PO TABS
5.0000 mg | ORAL_TABLET | Freq: Four times a day (QID) | ORAL | Status: DC | PRN
  Administered 2011-07-15 – 2011-07-17 (×7): 5 mg via ORAL
  Filled 2011-07-15 (×5): qty 1

## 2011-07-15 MED ORDER — INSULIN GLARGINE 100 UNIT/ML ~~LOC~~ SOLN
7.0000 [IU] | Freq: Two times a day (BID) | SUBCUTANEOUS | Status: DC
  Administered 2011-07-15 – 2011-07-17 (×6): 7 [IU] via SUBCUTANEOUS
  Filled 2011-07-15: qty 3

## 2011-07-15 MED ORDER — PANTOPRAZOLE SODIUM 40 MG PO TBEC
40.0000 mg | DELAYED_RELEASE_TABLET | Freq: Every day | ORAL | Status: DC
  Administered 2011-07-15 – 2011-07-17 (×3): 40 mg via ORAL
  Filled 2011-07-15 (×3): qty 1

## 2011-07-15 MED ORDER — INSULIN ASPART 100 UNIT/ML ~~LOC~~ SOLN
0.0000 [IU] | Freq: Every day | SUBCUTANEOUS | Status: DC
  Administered 2011-07-16: 5 [IU] via SUBCUTANEOUS

## 2011-07-15 MED ORDER — SODIUM CHLORIDE 0.9 % IJ SOLN: 3.0000 mL | Freq: Two times a day (BID) | INTRAMUSCULAR | Status: DC

## 2011-07-15 MED ORDER — HEPARIN SODIUM (PORCINE) 5000 UNIT/ML IJ SOLN
5000.0000 [IU] | Freq: Three times a day (TID) | INTRAMUSCULAR | Status: DC
  Filled 2011-07-15 (×10): qty 1

## 2011-07-15 MED ORDER — OXYCODONE HCL 5 MG PO TABS
ORAL_TABLET | ORAL | Status: AC
  Administered 2011-07-15: 5 mg via ORAL
  Filled 2011-07-15: qty 1

## 2011-07-15 MED ORDER — TACROLIMUS 1 MG PO CAPS
1.0000 mg | ORAL_CAPSULE | Freq: Two times a day (BID) | ORAL | Status: DC
  Administered 2011-07-15 – 2011-07-17 (×6): 1 mg via ORAL
  Filled 2011-07-15 (×7): qty 1

## 2011-07-15 MED ORDER — PREDNISONE 5 MG PO TABS
5.0000 mg | ORAL_TABLET | Freq: Every day | ORAL | Status: DC
  Administered 2011-07-15 – 2011-07-17 (×3): 5 mg via ORAL
  Filled 2011-07-15 (×4): qty 1

## 2011-07-15 MED ORDER — INFLUENZA VIRUS VACC SPLIT PF IM SUSP
0.5000 mL | INTRAMUSCULAR | Status: AC
  Administered 2011-07-16: 0.5 mL via INTRAMUSCULAR
  Filled 2011-07-15: qty 0.5

## 2011-07-15 MED ORDER — INSULIN ASPART 100 UNIT/ML ~~LOC~~ SOLN
0.0000 [IU] | Freq: Three times a day (TID) | SUBCUTANEOUS | Status: DC
  Administered 2011-07-15: 7 [IU] via SUBCUTANEOUS
  Administered 2011-07-16: 3 [IU] via SUBCUTANEOUS
  Administered 2011-07-17: 7 [IU] via SUBCUTANEOUS
  Filled 2011-07-15: qty 3

## 2011-07-15 MED ORDER — METOPROLOL SUCCINATE ER 100 MG PO TB24
100.0000 mg | ORAL_TABLET | Freq: Every day | ORAL | Status: DC
  Administered 2011-07-15 – 2011-07-16 (×2): 100 mg via ORAL
  Filled 2011-07-15 (×3): qty 1

## 2011-07-15 MED ORDER — ALUM & MAG HYDROXIDE-SIMETH 200-200-20 MG/5ML PO SUSP
30.0000 mL | Freq: Four times a day (QID) | ORAL | Status: DC | PRN
  Administered 2011-07-15: 30 mL via ORAL
  Filled 2011-07-15 (×2): qty 30

## 2011-07-15 MED ORDER — GUAIFENESIN-DM 100-10 MG/5ML PO SYRP
5.0000 mL | ORAL_SOLUTION | ORAL | Status: DC | PRN
  Filled 2011-07-15: qty 5

## 2011-07-15 MED ORDER — ONDANSETRON HCL 4 MG PO TABS
4.0000 mg | ORAL_TABLET | Freq: Four times a day (QID) | ORAL | Status: DC | PRN
  Administered 2011-07-16: 4 mg via ORAL
  Filled 2011-07-15: qty 1

## 2011-07-15 MED ORDER — DIPHENHYDRAMINE HCL 25 MG PO CAPS
25.0000 mg | ORAL_CAPSULE | Freq: Four times a day (QID) | ORAL | Status: DC | PRN
  Administered 2011-07-15 – 2011-07-17 (×3): 25 mg via ORAL
  Filled 2011-07-15 (×3): qty 1

## 2011-07-15 MED ORDER — ALBUTEROL SULFATE HFA 108 (90 BASE) MCG/ACT IN AERS
2.0000 | INHALATION_SPRAY | RESPIRATORY_TRACT | Status: DC | PRN
  Filled 2011-07-15: qty 6.7

## 2011-07-15 MED ORDER — PROMETHAZINE HCL 25 MG/ML IJ SOLN
12.5000 mg | Freq: Four times a day (QID) | INTRAMUSCULAR | Status: DC | PRN
  Administered 2011-07-15: 12.5 mg via INTRAVENOUS
  Filled 2011-07-15: qty 1

## 2011-07-15 MED ORDER — AMLODIPINE BESYLATE-VALSARTAN 10-160 MG PO TABS
1.0000 | ORAL_TABLET | Freq: Every day | ORAL | Status: DC
Start: 2011-07-15 — End: 2011-07-15

## 2011-07-15 MED ORDER — ALBUTEROL SULFATE (5 MG/ML) 0.5% IN NEBU: 2.5000 mg | INHALATION_SOLUTION | RESPIRATORY_TRACT | Status: DC | PRN

## 2011-07-15 MED ORDER — CYCLOBENZAPRINE HCL 10 MG PO TABS
5.0000 mg | ORAL_TABLET | Freq: Three times a day (TID) | ORAL | Status: DC | PRN
  Administered 2011-07-17: 5 mg via ORAL
  Filled 2011-07-15: qty 2

## 2011-07-15 MED ORDER — GUAIFENESIN ER 600 MG PO TB12
600.0000 mg | ORAL_TABLET | Freq: Two times a day (BID) | ORAL | Status: DC
  Administered 2011-07-15 – 2011-07-17 (×6): 600 mg via ORAL
  Filled 2011-07-15 (×7): qty 1

## 2011-07-15 NOTE — Progress Notes (Signed)
[id number]. nsg Pt refuses lab work claims she will get it in hd when she goes. Refuses heparin shots as well.

## 2011-07-15 NOTE — Progress Notes (Signed)
Subjective: Eating breakfast. Says she feels nauseous today.  Objective: Vital signs in last 24 hours: Temp:  [97.9 F (36.6 C)-98.3 F (36.8 C)] 97.9 F (36.6 C) (02/17 0900) Pulse Rate:  [89-110] 99  (02/17 0900) Resp:  [18-20] 19  (02/17 0900) BP: (135-182)/(77-91) 135/77 mmHg (02/17 0900) SpO2:  [94 %-100 %] 100 % (02/17 0900) Weight:  [78.1 kg (172 lb 2.9 oz)] 78.1 kg (172 lb 2.9 oz) (02/17 0025) Weight change:  Last BM Date: 07/14/11  Intake/Output from previous day: 02/16 0701 - 02/17 0700 In: 282.2 [P.O.:240; I.V.:40.2; IV Piggyback:2] Out: -  Total I/O In: 240 [P.O.:240] Out: -    Physical Exam: General: Alert, awake, oriented x3. HEENT: No bruits, no goiter. Heart: Regular rate and rhythm, without murmurs, rubs, gallops. Lungs: Clear to auscultation bilaterally. Abdomen: Soft, nontender, nondistended, positive bowel sounds. Extremities: No clubbing cyanosis or edema with positive pedal pulses. Neuro: Grossly intact, nonfocal.    Lab Results: Basic Metabolic Panel:  Basename 07/15/11 0023 07/14/11 1700  NA -- 131*  K -- 5.4*  CL -- 91*  CO2 -- 17*  GLUCOSE -- 347*  BUN -- 79*  CREATININE 12.42* 12.34*  CALCIUM -- 7.7*  MG -- --  PHOS -- --   CBC:  Basename 07/15/11 0023 07/14/11 1700  WBC 8.3 7.2  NEUTROABS -- --  HGB 10.3* 10.3*  HCT 31.8* 31.4*  MCV 94.6 93.7  PLT 220 213   Cardiac Enzymes:  Basename 07/15/11 0024  CKTOTAL 90  CKMB 1.5  CKMBINDEX --  TROPONINI <0.30   CBG:  Basename 07/15/11 0726 07/15/11 0022 07/14/11 1701  GLUCAP 311* 342* 221*   Studies/Results: Dg Chest 2 View  07/14/2011  *RADIOLOGY REPORT*  Clinical Data: Cough, bodyaches, and fever  CHEST - 2 VIEW  Comparison: 02/27/2011  Findings: Stable cardiomegaly.  Pulmonary vascularity is within normal limits.  The trachea is midline.  The lungs are clear. Negative for edema, focal airspace disease, or pleural effusion. The bony thorax is unremarkable.  IMPRESSION:  Stable cardiomegaly.  No acute findings in the chest.  Original Report Authenticated By: Britta Mccreedy, M.D.    Medications: Scheduled Meds:   . sodium chloride   Intravenous STAT  . albuterol  5 mg Nebulization Once  . amLODipine  10 mg Oral Daily   And  . olmesartan  20 mg Oral Daily  . aspirin  81 mg Oral Daily  . calcium acetate  1,334-2,001 mg Oral TID WC  . clonazePAM  1 mg Oral QHS  . guaiFENesin  600 mg Oral BID  . heparin  5,000 Units Subcutaneous Q8H  .  HYDROmorphone (DILAUDID) injection  0.5 mg Intravenous Once  .  HYDROmorphone (DILAUDID) injection  0.5 mg Intravenous Once  . influenza  inactive virus vaccine  0.5 mL Intramuscular Tomorrow-1000  . insulin aspart  0-5 Units Subcutaneous QHS  . insulin aspart  0-9 Units Subcutaneous TID WC  . insulin glargine  7 Units Subcutaneous BID  . ipratropium  0.5 mg Nebulization Once  . meclizine  25 mg Oral TID  . metoprolol succinate  100 mg Oral QHS  . mycophenolate  250 mg Oral BID  . ondansetron (ZOFRAN) IV  4 mg Intravenous Once  . pantoprazole  40 mg Oral Q1200  . predniSONE  5 mg Oral Q breakfast  . sodium chloride  500 mL Intravenous Once  . sodium chloride  500 mL Intravenous Once  . sodium chloride  3 mL Intravenous Q12H  .  sodium polystyrene  30 g Oral Once  . tacrolimus  1 mg Oral BID  . zolpidem  10 mg Oral QHS  . DISCONTD: sodium chloride   Intravenous STAT  . DISCONTD: amLODipine-valsartan  1 tablet Oral Daily   Continuous Infusions:  PRN Meds:.acetaminophen, acetaminophen, albuterol, albuterol, alum & mag hydroxide-simeth, cyclobenzaprine, diphenhydrAMINE, guaiFENesin-dextromethorphan, HYDROmorphone (DILAUDID) injection, ondansetron (ZOFRAN) IV, ondansetron (ZOFRAN) IV, ondansetron, promethazine  Assessment/Plan:  Principal Problem:  *Gastroenteritis, acute Active Problems:  Cough  Hypertension  ESRD (end stage renal disease)  Chest pain   #1 N/V/D: improved. Likely viral gastroenteritis,  ?norovirus community outbreak. Symptomatic management.  #2 ESRD: HD T-T-S. Missed HD yesterday because she was feeling poorly. Discussed with renal PA regarding admission and they will do a session today.  #3 Chest pain: resolved. So far ACS workup negative.  #4 Disposition: likely DC home in am.   LOS: 1 day   [facility] Pager: [phone] 07/15/2011, 1:13 PM

## 2011-07-15 NOTE — Consult Note (Addendum)
[facility] Renal Consultation Note  Indication for Consultation:  Management of ESRD/hemodialysis; anemia, hypertension/volume and secondary hyperparathyroidism  HPI: [name] is a 41 y.o. female admitted with Acute Gastroenteritis, dry cough and aching pain "all over my body" She missed her normal hemodialysis treatment  yesterday,Saturday because of her illness.Reporting vomiting and diarrhea past 24 hrs after having recent contact with child was "ill" . 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   Now in room eating lunch.   She refused to take Kayexalate earlier this am and still does,"it makes me go to the bathroo  Dialysis Orders: Center: [facility] on [address] . Center closed today for information will call in am to send EDW ? HD [facility] ?  Time 4hr Heparin ?Marland Kitchen Access AVF BFR400 DFR 800    Zemplar ? mcg IV/HD Epogen ?   Units IV/HD  Peter Minium    Past Medical History  Diagnosis Date  . Diabetes mellitus   . Hypertension   . Renal insufficiency   . Hyperlipidemia   . Alopecia   . GERD (gastroesophageal reflux disease)   . Obesity   . Iron deficiency   . ESRD (end stage renal disease)   . RLS (restless legs syndrome)   . PNA (pneumonia)   . Respiratory failure   . Injury of conjunctiva and corneal abrasion of right eye without foreign body   . Cellulitis   . Migraine     Past Surgical History  Procedure Date  . Cholecystectomy   . Combined kidney-pancreas transplant   . Thyroglobulin     x 7     History reviewed. No pertinent family history.    reports that she has never smoked. She has never used smokeless tobacco. She reports that she does not drink alcohol or use illicit drugs.   Allergies  Allergen Reactions  . Flagyl (Metronidazole Hcl) Other (See Comments)    unknown  . Morphine And Related Nausea And  Vomiting  . Penicillins     rash  . Tramadol Other (See Comments)    unknown    Prior to Admission medications   Medication Sig Start Date End Date Taking? Authorizing Provider  albuterol (PROVENTIL HFA;VENTOLIN HFA) 108 (90 BASE) MCG/ACT inhaler Inhale 2 puffs into the lungs every 4 (four) hours as needed. For wheezing.  04/23/11 04/22/12 Yes Sandrea Hughs, MD  amLODipine-valsartan (EXFORGE) 10-160 MG per tablet Take 1 tablet by mouth daily. 06/19/11 06/18/12 Yes Sandrea Hughs, MD  aspirin 81 MG tablet Take 81 mg by mouth daily.     Yes Historical Provider, MD  calcium acetate (PHOSLO) 667 MG capsule Take 1,334-2,001 mg by mouth 3 (three) times daily with meals. Dosage depends on meal consumed 03/12/11  Yes Historical Provider, MD  clonazePAM (KLONOPIN) 0.5 MG tablet Take 1 mg by mouth at bedtime. For anxiety   Yes Historical Provider, MD  cyclobenzaprine (FLEXERIL) 5 MG tablet Take 1 tablet by mouth Three times daily as needed. For muscle spasms. 01/04/11  Yes Historical Provider, MD  LANTUS SOLOSTAR 100 UNIT/ML injection Inject 7 Units into the skin 2 (two) times daily.  02/14/11  Yes Historical Provider, MD  meclizine (ANTIVERT) 25 MG tablet Take 25 mg by mouth 3 (three) times daily.   Yes Historical Provider, MD  metoprolol succinate (TOPROL-XL) 100 MG 24 hr tablet Take 100 mg by mouth at bedtime.  Do not take the night before dialysis. Dialysis on Tues, Thurs & Sat.   Yes Historical Provider, MD  mycophenolate (CELLCEPT) 250 MG capsule Take 250 mg by mouth 2 (two) times daily.    Yes Historical Provider, MD  NOVOLOG FLEXPEN 100 UNIT/ML injection Inject 3 Units into the skin 3 (three) times daily as needed. As directed. Per sliding scale.  >170= 2 units (on top of the three) >200= 3 units (on top of the three) >300= 4 units (on top of the three) >400= 5 units (on top of the three) >500= call MD Only uses if she eats. 02/14/11  Yes Historical Provider, MD  pantoprazole (PROTONIX) 40 MG tablet  Take 1 tablet by mouth Every morning. 03/12/11  Yes Historical Provider, MD  predniSONE (DELTASONE) 5 MG tablet Take 1 tablet by mouth daily. 01/21/11  Yes Historical Provider, MD  promethazine (PHENERGAN) 25 MG tablet Take 25 mg by mouth 2 (two) times daily as needed. For nausea. 03/21/11  Yes Historical Provider, MD  tacrolimus (PROGRAF) 1 MG capsule Take 1 mg by mouth 2 (two) times daily.     Yes Historical Provider, MD  zolpidem (AMBIEN) 10 MG tablet Take 1 tablet by mouth at bedtime. For sleep. 03/14/11  Yes Historical Provider, MD    ZOX:[id number], acetaminophen, albuterol, albuterol, alum & mag hydroxide-simeth, cyclobenzaprine, diphenhydrAMINE, guaiFENesin-dextromethorphan, HYDROmorphone (DILAUDID) injection, ondansetron (ZOFRAN) IV, ondansetron (ZOFRAN) IV, ondansetron, promethazine  Results for orders placed during the hospital encounter of 07/14/11 (from the past 48 hour(s))  CBC     Status: Abnormal   Collection Time   07/14/11  5:00 PM      Component Value Range Comment   WBC 7.2  4.0 - 10.5 (K/uL)    RBC 3.35 (*) 3.87 - 5.11 (MIL/uL)    Hemoglobin 10.3 (*) 12.0 - 15.0 (g/dL)    HCT 14.7 (*) 82.9 - 46.0 (%)    MCV 93.7  78.0 - 100.0 (fL)    MCH 30.7  26.0 - 34.0 (pg)    MCHC 32.8  30.0 - 36.0 (g/dL)    RDW 56.2 (*) 13.0 - 15.5 (%)    Platelets 213  150 - 400 (K/uL)   BASIC METABOLIC PANEL     Status: Abnormal   Collection Time   07/14/11  5:00 PM      Component Value Range Comment   Sodium 131 (*) 135 - 145 (mEq/L)    Potassium 5.4 (*) 3.5 - 5.1 (mEq/L)    Chloride 91 (*) 96 - 112 (mEq/L)    CO2 17 (*) 19 - 32 (mEq/L)    Glucose, Bld 347 (*) 70 - 99 (mg/dL)    BUN 79 (*) 6 - 23 (mg/dL)    Creatinine, Ser 86.57 (*) 0.50 - 1.10 (mg/dL)    Calcium 7.7 (*) 8.4 - 10.5 (mg/dL)    GFR calc non Af Amer 3 (*) >90 (mL/min)    GFR calc Af Amer 4 (*) >90 (mL/min)   GLUCOSE, CAPILLARY     Status: Abnormal   Collection Time   07/14/11  5:01 PM      Component Value Range Comment     Glucose-Capillary 221 (*) 70 - 99 (mg/dL)   GLUCOSE, CAPILLARY     Status: Abnormal   Collection Time   07/15/11 12:22 AM      Component Value Range Comment   Glucose-Capillary 342 (*) 70 - 99 (mg/dL)   CBC     Status: Abnormal   Collection Time  07/15/11 12:23 AM      Component Value Range Comment   WBC 8.3  4.0 - 10.5 (K/uL)    RBC 3.36 (*) 3.87 - 5.11 (MIL/uL)    Hemoglobin 10.3 (*) 12.0 - 15.0 (g/dL)    HCT 40.9 (*) 81.1 - 46.0 (%)    MCV 94.6  78.0 - 100.0 (fL)    MCH 30.7  26.0 - 34.0 (pg)    MCHC 32.4  30.0 - 36.0 (g/dL)    RDW 91.4 (*) 78.2 - 15.5 (%)    Platelets 220  150 - 400 (K/uL)   CREATININE, SERUM     Status: Abnormal   Collection Time   07/15/11 12:23 AM      Component Value Range Comment   Creatinine, Ser 12.42 (*) 0.50 - 1.10 (mg/dL)    GFR calc non Af Amer 3 (*) >90 (mL/min)    GFR calc Af Amer 4 (*) >90 (mL/min)   HEMOGLOBIN A1C     Status: Abnormal   Collection Time   07/15/11 12:23 AM      Component Value Range Comment   Hemoglobin A1C 10.8 (*) <5.7 (%)    Mean Plasma Glucose 263 (*) <117 (mg/dL)   CARDIAC PANEL(CRET KIN+CKTOT+MB+TROPI)     Status: Normal   Collection Time   07/15/11 12:24 AM      Component Value Range Comment   Total CK 90  7 - 177 (U/L)    CK, MB 1.5  0.3 - 4.0 (ng/mL)    Troponin I <0.30  <0.30 (ng/mL)    Relative Index RELATIVE INDEX IS INVALID  0.0 - 2.5    GLUCOSE, CAPILLARY     Status: Abnormal   Collection Time   07/15/11  7:26 AM      Component Value Range Comment   Glucose-Capillary 311 (*) 70 - 99 (mg/dL)   GLUCOSE, CAPILLARY     Status: Abnormal   Collection Time   07/15/11 11:55 AM      Component Value Range Comment   Glucose-Capillary 60 (*) 70 - 99 (mg/dL)   GLUCOSE, CAPILLARY     Status: Abnormal   Collection Time   07/15/11 12:28 PM      Component Value Range Comment   Glucose-Capillary 63 (*) 70 - 99 (mg/dL)   GLUCOSE, CAPILLARY     Status: Normal   Collection Time   07/15/11  1:11 PM      Component Value  Range Comment   Glucose-Capillary 72  70 - 99 (mg/dL)    EKG: normal EKG, normal sinus rhythm, unchanged from previous tracings, atrial fibrillation, .  ROS:  Negative except = fevers, chills, body aches, dry cough, nausea, vomiting, and diarrhea.  Physical Exam: Filed Vitals:   07/15/11 1300  BP: 114/72  Pulse: 69  Temp: 98.4 F (36.9 C)  Resp: 18     General: alert, nad, eating lunch, ox3 HEENT:[place] eomi, mmm Eyes:nonicteric Neck: supple no jvd Heart: RRR Lungs: CTA Bilaterally Abdomen:bs + all quadrants, soft, nontender Extremities:no pedal edema Skin:warm dry, no overt ulcers seen or rash Neuro:ox3, no acute deficits  Dialysis Access:positive bruit avf   Assessment/Plan: 1. Gastroenteritis/ possible Viral= per admit team 2. ESRD -  Usually TTS at [facility], refusing kayexalate. Hd will be late tonight secondary to scheduling 3 Hypertension/volume  - bp okay and no excess volume on cxr, keep wt even on hd/ on Amlodipine 4. Anemia  - hgb 10.3 , will obtain epo and iron records from outpatient center  when open 5. Metabolic bone disease -   Binders with meals, check outpt center for Zemplar 6. IDDM TYPE 1= per admit team 7. Hx failed renal/pancreas Tx 2008  Lenny Pastel, PA-C [facility] Beeper [phone] 07/15/2011, 2:49 PM   Patient seen and examined and agree with assessment and plan as above.  Patient has ESRD, failed K-P transplant 2008 back on HD and insulin.  She is on low doses of prograf, cellcept and prednisone.  She is admitted for a gastroenteritis type episode with N/V/D for less than a 24 hr period.  Diarhhea is better, nausea persisting. Denies any dysuria or resp symptoms, does not have a catheter, dialyzing via a LUA AVF. She missed her HD on Saturday- we will do dialysis later today if the schedule allows, otherwise tomorrow.  Electrolytes and volume status are stable. We will follow. Vinson Moselle  MD [employer] [phone] pgr    [phone] cell 07/15/2011, 6:54 PM

## 2011-07-16 ENCOUNTER — Encounter: Payer: Medicare Other | Admitting: Internal Medicine

## 2011-07-16 LAB — GLUCOSE, CAPILLARY
Glucose-Capillary: 92 mg/dL (ref 70–99)
Glucose-Capillary: 361 mg/dL — ABNORMAL HIGH (ref 70–99)

## 2011-07-16 MED ORDER — DARBEPOETIN ALFA-POLYSORBATE 60 MCG/0.3ML IJ SOLN
60.0000 ug | INTRAMUSCULAR | Status: DC
  Administered 2011-07-17: 60 ug via INTRAVENOUS
  Filled 2011-07-16: qty 0.3

## 2011-07-16 MED ORDER — HEPARIN SODIUM (PORCINE) 1000 UNIT/ML DIALYSIS
20.0000 [IU]/kg | INTRAMUSCULAR | Status: DC | PRN
  Filled 2011-07-16: qty 2

## 2011-07-16 MED ORDER — PARICALCITOL 5 MCG/ML IV SOLN
5.0000 ug | INTRAVENOUS | Status: DC
  Filled 2011-07-16: qty 1

## 2011-07-16 NOTE — Progress Notes (Signed)
Two IV team nurses came and attempted to place a peripheral IV, but were unsuccessful. Pt says they almost always have to place a central line. Dr. Philip Aspen plans to D/C pt tomorrow and does not want a central line placed. She is okay with pt not having a peripheral IV.

## 2011-07-16 NOTE — Progress Notes (Signed)
 This encounter was created in error - please disregard.

## 2011-07-16 NOTE — Progress Notes (Signed)
CBG: 60  Treatment: 15 GM carbohydrate snack  Symptoms: None  Follow-up CBG: Time: 8:24 CBG Result: 95  Possible Reasons for Event: Inadequate meal intake  Comments/MD notified: MD notified    Angie Fava

## 2011-07-16 NOTE — Progress Notes (Signed)
Subjective: Says she has hardly eaten, but as I walk in the room she is eating mashed potatoes and roasted chicken and is more than halfway thru her meal. Is not c/o n/v/d today.  Objective: Vital signs in last 24 hours: Temp:  [98 F (36.7 C)-100.9 F (38.3 C)] 98 F (36.7 C) (02/18 1400) Pulse Rate:  [80-127] 80  (02/18 1400) Resp:  [15-25] 20  (02/18 1400) BP: (121-198)/(66-113) 136/81 mmHg (02/18 1400) SpO2:  [92 %-98 %] 98 % (02/18 1400) Weight:  [76.75 kg (169 lb 3.3 oz)-78.2 kg (172 lb 6.4 oz)] 76.75 kg (169 lb 3.3 oz) (02/17 2251) Weight change: 0.1 kg (3.5 oz) Last BM Date: 07/15/11  Intake/Output from previous day: 02/17 0701 - 02/18 0700 In: 700 [P.O.:580; I.V.:120] Out: 1510  Total I/O In: 600 [P.O.:600] Out: -    Physical Exam: General: Alert, awake, oriented x3. HEENT: No bruits, no goiter. Heart: Regular rate and rhythm, without murmurs, rubs, gallops. Lungs: Clear to auscultation bilaterally. Abdomen: Soft, nontender, nondistended, positive bowel sounds. Extremities: No clubbing cyanosis or edema with positive pedal pulses. Neuro: Grossly intact, nonfocal.    Lab Results: Basic Metabolic Panel:  Basename 07/15/11 1841 07/15/11 0023 07/14/11 1700  NA 130* -- 131*  K 6.1* -- 5.4*  CL 91* -- 91*  CO2 18* -- 17*  GLUCOSE 202* -- 347*  BUN 87* -- 79*  CREATININE 13.81* 12.42* --  CALCIUM 8.2* -- 7.7*  MG 2.3 -- --  PHOS 6.9* -- --   CBC:  Basename 07/15/11 1841 07/15/11 0023  WBC 7.8 8.3  NEUTROABS -- --  HGB 10.2* 10.3*  HCT 30.8* 31.8*  MCV 93.6 94.6  PLT 190 220   Cardiac Enzymes:  Basename 07/15/11 1900 07/15/11 0024  CKTOTAL 102 90  CKMB 1.8 1.5  CKMBINDEX -- --  TROPONINI <0.30 <0.30   CBG:  Basename 07/16/11 1152 07/16/11 0824 07/16/11 0737 07/16/11 0621 07/15/11 2324 07/15/11 1644  GLUCAP 92 95 60* 103* 94 189*   Studies/Results: Dg Chest 2 View  07/14/2011  *RADIOLOGY REPORT*  Clinical Data: Cough, bodyaches, and fever   CHEST - 2 VIEW  Comparison: 02/27/2011  Findings: Stable cardiomegaly.  Pulmonary vascularity is within normal limits.  The trachea is midline.  The lungs are clear. Negative for edema, focal airspace disease, or pleural effusion. The bony thorax is unremarkable.  IMPRESSION: Stable cardiomegaly.  No acute findings in the chest.  Original Report Authenticated By: Britta Mccreedy, M.D.    Medications: Scheduled Meds:    . amLODipine  10 mg Oral Daily   And  . olmesartan  20 mg Oral Daily  . aspirin  81 mg Oral Daily  . calcium acetate  1,334-2,001 mg Oral TID WC  . clonazePAM  1 mg Oral QHS  . darbepoetin (ARANESP) injection - DIALYSIS  60 mcg Intravenous Q Tue-HD  . guaiFENesin  600 mg Oral BID  . heparin  5,000 Units Subcutaneous Q8H  . influenza  inactive virus vaccine  0.5 mL Intramuscular Tomorrow-1000  . insulin aspart  0-5 Units Subcutaneous QHS  . insulin aspart  0-9 Units Subcutaneous TID WC  . insulin glargine  7 Units Subcutaneous BID  . meclizine  25 mg Oral TID  . metoprolol succinate  100 mg Oral QHS  . mycophenolate  250 mg Oral BID  . pantoprazole  40 mg Oral Q1200  . paricalcitol  5 mcg Intravenous 3 times weekly  . predniSONE  5 mg Oral Q breakfast  .  sodium chloride  3 mL Intravenous Q12H  . sodium polystyrene  30 g Oral Once  . tacrolimus  1 mg Oral BID  . zolpidem  10 mg Oral QHS   Continuous Infusions:  PRN Meds:.acetaminophen, acetaminophen, albuterol, albuterol, alum & mag hydroxide-simeth, cyclobenzaprine, diphenhydrAMINE, guaiFENesin-dextromethorphan, heparin, ondansetron (ZOFRAN) IV, ondansetron, oxyCODONE, promethazine  Assessment/Plan:  Principal Problem:  *Gastroenteritis, acute Active Problems:  Cough  Hypertension  ESRD (end stage renal disease)  Chest pain   #1 N/V/D: resolved. Likely viral gastroenteritis, ?norovirus community outbreak. Symptomatic management.  #2 ESRD: HD T-T-S. Missed HD session on Saturday and had a makeup session  yesterday.  #3 Chest pain: resolved.  ACS workup negative. No further cardiac workup planned for the admission.  #4 Disposition: Patient is medically ready for DC home. States that she does not have a ride home and that she does not have a key to her house. Will alert CM to see if there is anything we can do to get her home today, otherwise will DC home after HD in am.   LOS: 2 days   [facility] Pager: [phone] 07/16/2011, 2:51 PM

## 2011-07-16 NOTE — Progress Notes (Signed)
Subjective:  Nausea with no appetite, feels "heavy" as if she has a lot of fluid on  Objective: Vital signs in last 24 hours: Temp:  [97.9 F (36.6 C)-100.9 F (38.3 C)] 99.9 F (37.7 C) (02/18 0547) Pulse Rate:  [69-127] 88  (02/18 0547) Resp:  [15-25] 18  (02/18 0547) BP: (114-198)/(66-113) 128/66 mmHg (02/18 0547) SpO2:  [92 %-100 %] 94 % (02/18 0547) Weight:  [76.75 kg (169 lb 3.3 oz)-78.2 kg (172 lb 6.4 oz)] 76.75 kg (169 lb 3.3 oz) (02/17 2251) Weight change: 0.1 kg (3.5 oz)  Intake/Output from previous day: 02/17 0701 - 02/18 0700 In: 700 [P.O.:580; I.V.:120] Out: 1510    EXAM: General appearance:  Alert, in no apparent distress Resp:  CTA blaterally Cardio:  RRR without murmur GI:  + BS, soft and notender Extremities:  No edema Access:  AVF @ RUA with + bruit  Lab Results:  Basename 07/15/11 1841 07/15/11 0023  WBC 7.8 8.3  HGB 10.2* 10.3*  HCT 30.8* 31.8*  PLT 190 220   BMET:  Basename 07/15/11 1841 07/15/11 0023 07/14/11 1700  NA 130* -- 131*  K 6.1* -- 5.4*  CL 91* -- 91*  CO2 18* -- 17*  GLUCOSE 202* -- 347*  BUN 87* -- 79*  CREATININE 13.81* 12.42* --  CALCIUM 8.2* -- 7.7*  ALBUMIN 3.1* -- --   No results found for this basename: PTH:2 in the last 72 hours Iron Studies: No results found for this basename: IRON,TIBC,TRANSFERRIN,FERRITIN in the last 72 hours  Assessment/Plan: 1.  Gastroenteritis - admitted with N,V,D, likely viral, symptomatic management per admit team. 2.  ESRD - on HD on [facility], K improved to 5.4 s/p HD yesterday (pt refused Kayexalate). Next HD tomorrow. 3.  HTN/Volume - BP stable on Amlodipine 10 mg qd and Metoprolol 100 mg qhs; chest x-ray yesterday pre-HD with no acute findings (no edema), although wt is 76.7 kg when EDW is 71 kg. 4.  Anemia - Hgb 10.2 yesterday, on Epogen 5000 U as outpatient.  Will give Aranesp 60 mg tomorrow with HD. 5.  Metabolic bone disease - on Phoslo 2 with meals, Zemplar 5 mcg per HD, Ca 8.2, P  6.9. 6.  IDDM Type 1 - per admit team. 7.  History of failed renal/pancreatic transplant - in 2004, remains on Prednisone, Cellcept, and Prograf.    LOS: 2 days   LYLES,CHARLES 07/16/2011,8:03 AM  Eating beef stew.  "I hope it stays down."  For HD tomorrow.  Will try for weight 70 KG Agree with plans.

## 2011-07-16 NOTE — Progress Notes (Signed)
Utilization Review Completed.[name] T2/18/2013   

## 2011-07-17 ENCOUNTER — Inpatient Hospital Stay (HOSPITAL_COMMUNITY): Payer: Medicare Other

## 2011-07-17 LAB — GLUCOSE, CAPILLARY: Glucose-Capillary: 84 mg/dL (ref 70–99)

## 2011-07-17 MED ORDER — DIPHENHYDRAMINE HCL 25 MG PO CAPS
ORAL_CAPSULE | ORAL | Status: AC
  Filled 2011-07-17: qty 1

## 2011-07-17 MED ORDER — OXYCODONE HCL 5 MG PO TABS
ORAL_TABLET | ORAL | Status: AC
  Filled 2011-07-17: qty 1

## 2011-07-17 MED ORDER — DARBEPOETIN ALFA-POLYSORBATE 60 MCG/0.3ML IJ SOLN
INTRAMUSCULAR | Status: AC
  Filled 2011-07-17: qty 0.3

## 2011-07-17 MED ORDER — ONDANSETRON HCL 4 MG/2ML IJ SOLN
INTRAMUSCULAR | Status: AC
  Filled 2011-07-17: qty 2

## 2011-07-17 NOTE — Progress Notes (Addendum)
Subjective:  Sluggish since receiving flu shot yesterday, little appetite with occasional nausea, no dyspnea  Objective: Vital signs in last 24 hours: Temp:  [98 F (36.7 C)-99.7 F (37.6 C)] 99 F (37.2 C) (02/19 0430) Pulse Rate:  [80-103] 92  (02/19 0430) Resp:  [20] 20  (02/19 0430) BP: (121-139)/(74-81) 124/76 mmHg (02/19 0430) SpO2:  [98 %-100 %] 100 % (02/19 0430) Weight:  [78 kg (171 lb 15.3 oz)] 78 kg (171 lb 15.3 oz) (02/18 2139) Weight change: -0.2 kg (-7.1 oz)  Intake/Output from previous day: 02/18 0701 - 02/19 0700 In: 840 [P.O.:840] Out: -    EXAM: General appearance:  Alert, in no apparent distress Resp:  CTA bilaterally Cardio:  RRR without murmur GI:  + BS, soft and nontender Extremities:  No edema  Access: AVF @ RUA with + bruit  Lab Results:  Basename 07/15/11 1841 07/15/11 0023  WBC 7.8 8.3  HGB 10.2* 10.3*  HCT 30.8* 31.8*  PLT 190 220   BMET:  Basename 07/15/11 1841 07/15/11 0023 07/14/11 1700  NA 130* -- 131*  K 6.1* -- 5.4*  CL 91* -- 91*  CO2 18* -- 17*  GLUCOSE 202* -- 347*  BUN 87* -- 79*  CREATININE 13.81* 12.42* --  CALCIUM 8.2* -- 7.7*  ALBUMIN 3.1* -- --   No results found for this basename: PTH:2 in the last 72 hours Iron Studies: No results found for this basename: IRON,TIBC,TRANSFERRIN,FERRITIN in the last 72 hours  Assessment/Plan: 1.  Gastroenteritis - admitted with N,V,D, likely viral, symptomatic management per admit team.  2.  ESRD - on HD on [facility] @ [facility], last K 6.1 pre-HD on 2/17.  HD pending today.  3.  HTN/Volume - BP stable on Amlodipine 10 mg qd and Metoprolol 100 mg qhs; chest x-ray on 2/17 pre-HD with no acute findings (no edema), although wt is 76.7 kg when EDW is 71 kg.  4.  Anemia - last Hgb 10.2, on Epogen 5000 U as outpatient. Aranesp 60 mg today with HD.  5.  Metabolic bone disease - on Phoslo 2 with meals, Zemplar 5 mcg per HD, Ca 8.2, P 6.9.  New labwork today. 6.  IDDM Type 1 - per admit team.  7.  History  of failed renal/pancreatic transplant - in 2004, remains on Prednisone, Cellcept, and Prograf.     LOS: 3 days   [name],[name] 07/17/2011,7:51 AM   [name] says she's felt "weak" since flu shot yesterday.  Some nausea last night--none today, but she hasn't eaten yet.  Dialysis sched for later today EDW as outpt--71kg EPO 5000 TIW Zemplar each hd

## 2011-07-17 NOTE — Progress Notes (Signed)
Spoke with Dr.Hernandez about pt CBC and Renal panel labs being lost. I asked did she need them redrawn by lab, because if so they have to be drawn 2 hours after HD. Dr.Hernandez said that she could still be discharged and the labs didn't need to be redrawn.

## 2011-07-17 NOTE — Progress Notes (Signed)
Hemodialysis-See flowsheet Labs (cbc and renal) drawn pre HD. Thrown away by accident. Informed lab of necessary redraw.

## 2011-07-17 NOTE — Progress Notes (Signed)
Patient off unit

## 2011-07-17 NOTE — Discharge Summary (Signed)
Physician Discharge Summary  Patient ID: KARIME SCHEUERMANN MRN: [id number] DOB/AGE: July 13, 1970 41 y.o.  Admit date: 07/14/2011 Discharge date: 07/17/2011  Primary Care Physician:  Gwynneth Aliment, MD, MD   Discharge Diagnoses:    Principal Problem:  *Gastroenteritis, acute Active Problems:  Cough  Hypertension  ESRD (end stage renal disease)  Chest pain    Medication List  As of 07/17/2011 12:53 PM   STOP taking these medications         TOPROL XL 100 MG 24 hr tablet         TAKE these medications         albuterol 108 (90 BASE) MCG/ACT inhaler   Commonly known as: PROVENTIL HFA;VENTOLIN HFA   Inhale 2 puffs into the lungs every 4 (four) hours as needed. For wheezing.      amLODipine-valsartan 10-160 MG per tablet   Commonly known as: EXFORGE   Take 1 tablet by mouth daily.      aspirin 81 MG tablet   Take 81 mg by mouth daily.      calcium acetate 667 MG capsule   Commonly known as: PHOSLO   Take 1,334-2,001 mg by mouth 3 (three) times daily with meals. Dosage depends on meal consumed      CELLCEPT 250 MG capsule   Generic drug: mycophenolate   Take 250 mg by mouth 2 (two) times daily.      clonazePAM 0.5 MG tablet   Commonly known as: KLONOPIN   Take 1 mg by mouth at bedtime. For anxiety      cyclobenzaprine 5 MG tablet   Commonly known as: FLEXERIL   Take 1 tablet by mouth Three times daily as needed. For muscle spasms.      LANTUS SOLOSTAR 100 UNIT/ML injection   Generic drug: insulin glargine   Inject 7 Units into the skin 2 (two) times daily.      meclizine 25 MG tablet   Commonly known as: ANTIVERT   Take 25 mg by mouth 3 (three) times daily.      metoprolol succinate 100 MG 24 hr tablet   Commonly known as: TOPROL-XL   Take 100 mg by mouth at bedtime. Do not take the night before dialysis. Dialysis on Tues, Thurs & Sat.      NOVOLOG FLEXPEN 100 UNIT/ML injection   Generic drug: insulin aspart   Inject 3 Units into the skin 3 (three) times  daily as needed. As directed. Per sliding scale.   >170= 2 units (on top of the three)  >200= 3 units (on top of the three)  >300= 4 units (on top of the three)  >400= 5 units (on top of the three)  >500= call MD  Only uses if she eats.      pantoprazole 40 MG tablet   Commonly known as: PROTONIX   Take 1 tablet by mouth Every morning.      predniSONE 5 MG tablet   Commonly known as: DELTASONE   Take 1 tablet by mouth daily.      PROGRAF 1 MG capsule   Generic drug: tacrolimus   Take 1 mg by mouth 2 (two) times daily.      promethazine 25 MG tablet   Commonly known as: PHENERGAN   Take 25 mg by mouth 2 (two) times daily as needed. For nausea.      zolpidem 10 MG tablet   Commonly known as: AMBIEN   Take 1 tablet by mouth at bedtime. For sleep.  Disposition and Follow-up:  To be discharged home today in stable and improved condition.   Consults:  nephrology Dr. Caryn Section   Significant Diagnostic Studies:  Dg Chest 2 View  07/14/2011  *RADIOLOGY REPORT*  Clinical Data: Cough, bodyaches, and fever  CHEST - 2 VIEW  Comparison: 02/27/2011  Findings: Stable cardiomegaly.  Pulmonary vascularity is within normal limits.  The trachea is midline.  The lungs are clear. Negative for edema, focal airspace disease, or pleural effusion. The bony thorax is unremarkable.  IMPRESSION: Stable cardiomegaly.  No acute findings in the chest.  Original Report Authenticated By: Britta Mccreedy, M.D.    Brief H and P: For complete details please refer to admission H and P, but in brief patient is a 41 y.o. 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 She missed an HD session because she was feeling poorly. We are asked to admit her for  further evaluation and management.      Hospital Course:  Principal Problem:  *Gastroenteritis, acute Active Problems:  Cough  Hypertension  ESRD (end stage renal disease)  Chest pain   #1 N/V/D: resolved. Likely viral gastroenteritis, ?norovirus community outbreak.   #2 ESRD: HD T-T-S. Missed HD session on Saturday and had a makeup session Sunday. Will receive HD today which is her regular day. Renal has been following.  #3 Chest pain: resolved. ACS workup negative. No further cardiac workup planned for the admission  All rest of chronic medical problems are stable this admission, home medications have not been changed.   Time spent on Discharge: Greater than 30 minutes.  SignedChaya Jan [facility] Pager: [phone] 07/17/2011, 12:53 PM

## 2011-07-31 ENCOUNTER — Inpatient Hospital Stay (HOSPITAL_COMMUNITY)
Admission: EM | Admit: 2011-07-31 | Discharge: 2011-08-03 | DRG: 637 | Disposition: A | Discharge status: Home / Self Care | Payer: Medicare Other | Source: Ambulatory Visit | Attending: Internal Medicine | Admitting: Internal Medicine

## 2011-07-31 ENCOUNTER — Encounter (HOSPITAL_COMMUNITY): Payer: Self-pay | Admitting: Emergency Medicine

## 2011-07-31 DIAGNOSIS — R509 Fever, unspecified: Secondary | ICD-10-CM | POA: Diagnosis present

## 2011-07-31 DIAGNOSIS — I120 Hypertensive chronic kidney disease with stage 5 chronic kidney disease or end stage renal disease: Secondary | ICD-10-CM | POA: Diagnosis present

## 2011-07-31 DIAGNOSIS — Y830 Surgical operation with transplant of whole organ as the cause of abnormal reaction of the patient, or of later complication, without mention of misadventure at the time of the procedure: Secondary | ICD-10-CM | POA: Diagnosis present

## 2011-07-31 DIAGNOSIS — N186 End stage renal disease: Secondary | ICD-10-CM | POA: Diagnosis present

## 2011-07-31 DIAGNOSIS — R519 Headache, unspecified: Secondary | ICD-10-CM | POA: Diagnosis present

## 2011-07-31 DIAGNOSIS — Z886 Allergy status to analgesic agent status: Secondary | ICD-10-CM

## 2011-07-31 DIAGNOSIS — T8610 Unspecified complication of kidney transplant: Secondary | ICD-10-CM | POA: Diagnosis present

## 2011-07-31 DIAGNOSIS — D638 Anemia in other chronic diseases classified elsewhere: Secondary | ICD-10-CM | POA: Diagnosis present

## 2011-07-31 DIAGNOSIS — N2581 Secondary hyperparathyroidism of renal origin: Secondary | ICD-10-CM | POA: Diagnosis present

## 2011-07-31 DIAGNOSIS — R51 Headache: Secondary | ICD-10-CM | POA: Diagnosis present

## 2011-07-31 DIAGNOSIS — Z8701 Personal history of pneumonia (recurrent): Secondary | ICD-10-CM

## 2011-07-31 DIAGNOSIS — Z794 Long term (current) use of insulin: Secondary | ICD-10-CM

## 2011-07-31 DIAGNOSIS — E1010 Type 1 diabetes mellitus with ketoacidosis without coma: Secondary | ICD-10-CM | POA: Diagnosis present

## 2011-07-31 DIAGNOSIS — Z880 Allergy status to penicillin: Secondary | ICD-10-CM

## 2011-07-31 DIAGNOSIS — E1110 Type 2 diabetes mellitus with ketoacidosis without coma: Secondary | ICD-10-CM

## 2011-07-31 DIAGNOSIS — IMO0002 Reserved for concepts with insufficient information to code with codable children: Secondary | ICD-10-CM

## 2011-07-31 DIAGNOSIS — R109 Unspecified abdominal pain: Secondary | ICD-10-CM | POA: Diagnosis present

## 2011-07-31 DIAGNOSIS — M899 Disorder of bone, unspecified: Secondary | ICD-10-CM | POA: Diagnosis present

## 2011-07-31 DIAGNOSIS — Z883 Allergy status to other anti-infective agents status: Secondary | ICD-10-CM

## 2011-07-31 DIAGNOSIS — G43909 Migraine, unspecified, not intractable, without status migrainosus: Secondary | ICD-10-CM | POA: Diagnosis present

## 2011-07-31 HISTORY — DX: Anemia, unspecified: D64.9

## 2011-07-31 HISTORY — DX: Encounter for other specified aftercare: Z51.89

## 2011-07-31 HISTORY — DX: Major depressive disorder, single episode, unspecified: F32.9

## 2011-07-31 HISTORY — DX: Depression, unspecified: F32.A

## 2011-07-31 HISTORY — DX: Angina pectoris, unspecified: I20.9

## 2011-07-31 HISTORY — DX: Pneumonia, unspecified organism: J18.9

## 2011-07-31 HISTORY — DX: Dependence on renal dialysis: Z99.2

## 2011-07-31 HISTORY — DX: Type 1 diabetes mellitus without complications: E10.9

## 2011-07-31 LAB — POCT I-STAT 3, VENOUS BLOOD GAS (G3P V)
Acid-base deficit: 8 mmol/L — ABNORMAL HIGH (ref 0.0–2.0)
Bicarbonate: 17.7 mEq/L — ABNORMAL LOW (ref 20.0–24.0)
TCO2: 19 mmol/L (ref 0–100)
pH, Ven: 7.318 — ABNORMAL HIGH (ref 7.250–7.300)

## 2011-07-31 LAB — BASIC METABOLIC PANEL
Chloride: 90 mEq/L — ABNORMAL LOW (ref 96–112)
GFR calc Af Amer: 4 mL/min — ABNORMAL LOW (ref >90)
GFR calc non Af Amer: 3 mL/min — ABNORMAL LOW (ref >90)
Potassium: 4.3 mEq/L (ref 3.5–5.1)
Sodium: 128 mEq/L — ABNORMAL LOW (ref 135–145)

## 2011-07-31 LAB — CBC
Hemoglobin: 11 g/dL — ABNORMAL LOW (ref 12.0–15.0)
MCH: 31.3 pg (ref 26.0–34.0)
MCHC: 34.3 g/dL (ref 30.0–36.0)
MCV: 91.5 fL (ref 78.0–100.0)
RBC: 3.51 MIL/uL — ABNORMAL LOW (ref 3.87–5.11)

## 2011-07-31 LAB — GLUCOSE, CAPILLARY: Glucose-Capillary: 398 mg/dL — ABNORMAL HIGH (ref 70–99)

## 2011-07-31 MED ORDER — SODIUM CHLORIDE 0.9 % IV SOLN
INTRAVENOUS | Status: DC
  Administered 2011-07-31: via INTRAVENOUS

## 2011-07-31 MED ORDER — INSULIN REGULAR BOLUS VIA INFUSION
0.0000 [IU] | Freq: Once | INTRAVENOUS | Status: DC
  Filled 2011-07-31: qty 10

## 2011-07-31 MED ORDER — METOCLOPRAMIDE HCL 5 MG/ML IJ SOLN
10.0000 mg | Freq: Once | INTRAMUSCULAR | Status: AC
  Administered 2011-07-31: 10 mg via INTRAMUSCULAR
  Filled 2011-07-31: qty 2

## 2011-07-31 MED ORDER — DEXTROSE 50 % IV SOLN: 25.0000 mL | INTRAVENOUS | Status: DC | PRN

## 2011-07-31 MED ORDER — INSULIN REGULAR HUMAN 100 UNIT/ML IJ SOLN
INTRAMUSCULAR | Status: AC
  Administered 2011-07-31: 3.4 [IU]/h via INTRAVENOUS
  Filled 2011-07-31: qty 1

## 2011-07-31 MED ORDER — HYDROMORPHONE HCL PF 1 MG/ML IJ SOLN
1.0000 mg | Freq: Once | INTRAMUSCULAR | Status: AC
  Administered 2011-07-31: 1 mg via INTRAMUSCULAR
  Filled 2011-07-31: qty 1

## 2011-07-31 MED ORDER — DEXTROSE-NACL 5-0.45 % IV SOLN
INTRAVENOUS | Status: DC
  Administered 2011-08-01: 02:00:00 via INTRAVENOUS

## 2011-07-31 MED ORDER — SODIUM CHLORIDE 0.9 % IV BOLUS (SEPSIS)
1000.0000 mL | Freq: Once | INTRAVENOUS | Status: AC
  Administered 2011-07-31: 1000 mL via INTRAVENOUS

## 2011-07-31 NOTE — ED Notes (Signed)
Pt c/o migraine headache, fever, chills.  Also c/o ? Wound on lower abd with pain x'2 1 week

## 2011-07-31 NOTE — ED Notes (Signed)
Received pt. From triage, pt. Alert and oriented, pt. C/o cough, H/A, chills, abd. Abscess, NAD noted

## 2011-07-31 NOTE — ED Notes (Signed)
Unsuccessful attempt to start IV, IV team called

## 2011-07-31 NOTE — ED Provider Notes (Signed)
History     CSN: [id number]  Arrival date & time 07/31/11  1550   First MD Initiated Contact with Patient 07/31/11 2022      Chief Complaint  Patient presents with  . Migraine    HPI The patient reports nausea and vomiting last night.  Today she reports she generally feels weak.  She dialyzes Tuesday Thursdays and Saturdays.  Her last dialysis was Saturday and she did not dialyze today because she felt too poor.  She reports her blood sugars to been difficult to control over the past week or 2.  She now has chills low-grade fever and complains of a headache.  She denies upper lower extremity weakness.  She's had no changes in her speech.  His been no report of confusion.  She denies coughing congestion.  She denies abdominal pain.  She denies diarrhea.  Nothing worsens her symptoms.  Nothing improves her symptoms.  Her symptoms are constant.  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  Past Medical History  Diagnosis Date  . Diabetes mellitus   . Hypertension   . Renal insufficiency   . Hyperlipidemia   . Alopecia   . GERD (gastroesophageal reflux disease)   . Obesity   . Iron deficiency   . ESRD (end stage renal disease)   . RLS (restless legs syndrome)   . PNA (pneumonia)   . Respiratory failure   . Injury of conjunctiva and corneal abrasion of right eye without foreign body   . Cellulitis   . Migraine     Past Surgical History  Procedure Date  . Cholecystectomy   . Combined kidney-pancreas transplant   . Thyroglobulin     x 7    No family history on file.  History  Substance Use Topics  . Smoking status: Never Smoker   . Smokeless tobacco: Never Used  . Alcohol Use: No    OB History    Grav Para Term Preterm Abortions TAB SAB Ect Mult Living                  Review of Systems  All other systems reviewed and are  negative.    Allergies  Flagyl; Morphine and related; Penicillins; and Tramadol  Home Medications   Current Outpatient Rx  Name Route Sig Dispense Refill  . ALBUTEROL SULFATE HFA 108 (90 BASE) MCG/ACT IN AERS Inhalation Inhale 2 puffs into the lungs every 4 (four) hours as needed. For wheezing.     Marland Kitchen AMLODIPINE BESYLATE-VALSARTAN 10-160 MG PO TABS Oral Take 1 tablet by mouth daily.    . ASPIRIN 81 MG PO TABS Oral Take 81 mg by mouth daily.      Marland Kitchen CALCIUM ACETATE 667 MG PO CAPS Oral Take 1,334-2,001 mg by mouth 3 (three) times daily with meals. Dosage depends on meal consumed    . CLONAZEPAM 0.5 MG PO TABS Oral Take 2 mg by mouth at bedtime. For anxiety    . CYCLOBENZAPRINE HCL 5 MG PO TABS Oral Take 1 tablet by mouth Three times daily as needed. For muscle spasms.    Marland Kitchen LANTUS SOLOSTAR 100 UNIT/ML [place] SOLN Subcutaneous Inject 7 Units into the skin 2 (two) times daily.     Marland Kitchen METOPROLOL SUCCINATE ER 100 MG PO TB24 Oral Take 100 mg by mouth at bedtime. Do not take  the night before dialysis. Dialysis on Tues, Thurs & Sat.    Marland Kitchen MYCOPHENOLATE MOFETIL 250 MG PO CAPS Oral Take 250 mg by mouth 2 (two) times daily.     Marland Kitchen NOVOLOG FLEXPEN 100 UNIT/ML [place] SOLN Subcutaneous Inject 3 Units into the skin 3 (three) times daily as needed. As directed. Per sliding scale.  >170= 2 units (on top of the three) >200= 3 units (on top of the three) >300= 4 units (on top of the three) >400= 5 units (on top of the three) >500= call MD Only uses if she eats.    Marland Kitchen PANTOPRAZOLE SODIUM 40 MG PO TBEC Oral Take 1 tablet by mouth Every morning.    Marland Kitchen PREDNISONE 5 MG PO TABS Oral Take 1 tablet by mouth daily.    Marland Kitchen PROMETHAZINE HCL 25 MG PO TABS Oral Take 25 mg by mouth 2 (two) times daily as needed. For nausea.    Marland Kitchen TACROLIMUS 1 MG PO CAPS Oral Take 1 mg by mouth 2 (two) times daily.      Marland Kitchen ZOLPIDEM TARTRATE 10 MG PO TABS Oral Take 1 tablet by mouth at bedtime. For sleep.      BP 172/99  Pulse 97  Temp(Src) 98.7 F  (37.1 C) (Oral)  Resp 20  SpO2 97%  LMP 07/13/2011  Physical Exam  Nursing note and vitals reviewed. Constitutional: She is oriented to person, place, and time. She appears well-developed and well-nourished. No distress.  HENT:  Head: Normocephalic and atraumatic.  Eyes: EOM are normal. Pupils are equal, round, and reactive to light.  Neck: Normal range of motion.  Cardiovascular: Normal rate, regular rhythm and normal heart sounds.   Pulmonary/Chest: Effort normal and breath sounds normal.  Abdominal: Soft. She exhibits no distension. There is no tenderness.  Musculoskeletal: Normal range of motion.  Neurological: She is alert and oriented to person, place, and time.       5/5 strength in major muscle groups of  bilateral upper and lower extremities. Speech normal. No facial asymetry.   Skin: Skin is warm and dry.  Psychiatric: She has a normal mood and affect. Judgment normal.    ED Course  Procedures (including critical care time)  Labs Reviewed  BASIC METABOLIC PANEL - Abnormal; Notable for the following:    Sodium 128 (*)    Chloride 90 (*)    CO2 15 (*)    Glucose, Bld 464 (*)    BUN 95 (*)    Creatinine, Ser 12.65 (*)    GFR calc non Af Amer 3 (*)    GFR calc Af Amer 4 (*)    All other components within normal limits  CBC - Abnormal; Notable for the following:    RBC 3.51 (*)    Hemoglobin 11.0 (*)    HCT 32.1 (*)    RDW 15.6 (*)    All other components within normal limits  POCT I-STAT 3, BLOOD GAS (G3P V) - Abnormal; Notable for the following:    pH, Ven 7.318 (*)    pCO2, Ven 34.4 (*)    pO2, Ven 59.0 (*)    Bicarbonate 17.7 (*)    Acid-base deficit 8.0 (*)    All other components within normal limits  GLUCOSE, CAPILLARY - Abnormal; Notable for the following:    Glucose-Capillary 398 (*)    All other components within normal limits  BLOOD GAS, VENOUS  URINALYSIS, WITH MICROSCOPIC   No results found.   1. DKA (diabetic ketoacidoses)  MDM    The patient presents with generalized weakness nausea and vomiting.  Her anion gap is 23.  Her bicarbonate is 15.  PH is 7.31.  This is likely diabetic ketoacidosis.  She's been started on fluids as well as an insulin drip.  Her laboratory data is difficult to interpret given that she also has not had dialysis in 3 days and his uremic which could also cause her mild acidosis and her anion gap.  She will be [facility] to the [facility] for ongoing care and inpatient evaluation.         Lyanne Co, MD 07/31/11 [phone]

## 2011-08-01 ENCOUNTER — Encounter (HOSPITAL_COMMUNITY): Payer: Self-pay | Admitting: General Practice

## 2011-08-01 ENCOUNTER — Emergency Department (HOSPITAL_COMMUNITY): Payer: Medicare Other

## 2011-08-01 ENCOUNTER — Inpatient Hospital Stay (HOSPITAL_COMMUNITY): Payer: Medicare Other

## 2011-08-01 DIAGNOSIS — R109 Unspecified abdominal pain: Secondary | ICD-10-CM | POA: Diagnosis present

## 2011-08-01 DIAGNOSIS — R519 Headache, unspecified: Secondary | ICD-10-CM | POA: Diagnosis present

## 2011-08-01 DIAGNOSIS — Z940 Kidney transplant status: Secondary | ICD-10-CM | POA: Insufficient documentation

## 2011-08-01 DIAGNOSIS — R51 Headache: Secondary | ICD-10-CM | POA: Diagnosis present

## 2011-08-01 DIAGNOSIS — E1010 Type 1 diabetes mellitus with ketoacidosis without coma: Secondary | ICD-10-CM | POA: Diagnosis present

## 2011-08-01 DIAGNOSIS — R509 Fever, unspecified: Secondary | ICD-10-CM | POA: Diagnosis present

## 2011-08-01 DIAGNOSIS — Z9483 Pancreas transplant status: Secondary | ICD-10-CM

## 2011-08-01 HISTORY — DX: Kidney transplant status: Z94.0

## 2011-08-01 LAB — CBC
HCT: 30.2 % — ABNORMAL LOW (ref 36.0–46.0)
Hemoglobin: 10.2 g/dL — ABNORMAL LOW (ref 12.0–15.0)
MCH: 30.9 pg (ref 26.0–34.0)
MCHC: 33.8 g/dL (ref 30.0–36.0)
MCV: 91.5 fL (ref 78.0–100.0)
Platelets: 235 10*3/uL (ref 150–400)
RBC: 3.3 MIL/uL — ABNORMAL LOW (ref 3.87–5.11)
RDW: 15.8 % — ABNORMAL HIGH (ref 11.5–15.5)
WBC: 10.5 10*3/uL (ref 4.0–10.5)

## 2011-08-01 LAB — BASIC METABOLIC PANEL
BUN: 96 mg/dL — ABNORMAL HIGH (ref 6–23)
CO2: 20 mEq/L (ref 19–32)
Chloride: 97 mEq/L (ref 96–112)
Chloride: 87 mEq/L — ABNORMAL LOW (ref 96–112)
GFR calc Af Amer: 4 mL/min — ABNORMAL LOW (ref >90)
GFR calc non Af Amer: 3 mL/min — ABNORMAL LOW (ref >90)
Glucose, Bld: 145 mg/dL — ABNORMAL HIGH (ref 70–99)
Potassium: 4.4 mEq/L (ref 3.5–5.1)
Potassium: 5.3 mEq/L — ABNORMAL HIGH (ref 3.5–5.1)
Sodium: 135 mEq/L (ref 135–145)
Sodium: 123 mEq/L — ABNORMAL LOW (ref 135–145)

## 2011-08-01 LAB — GLUCOSE, CAPILLARY
Glucose-Capillary: 114 mg/dL — ABNORMAL HIGH (ref 70–99)
Glucose-Capillary: 137 mg/dL — ABNORMAL HIGH (ref 70–99)
Glucose-Capillary: 83 mg/dL (ref 70–99)
Glucose-Capillary: 132 mg/dL — ABNORMAL HIGH (ref 70–99)
Glucose-Capillary: 118 mg/dL — ABNORMAL HIGH (ref 70–99)
Glucose-Capillary: 246 mg/dL — ABNORMAL HIGH (ref 70–99)
Glucose-Capillary: >600 mg/dL (ref 70–99)

## 2011-08-01 MED ORDER — SODIUM CHLORIDE 0.9 % IJ SOLN
3.0000 mL | Freq: Two times a day (BID) | INTRAMUSCULAR | Status: DC
  Administered 2011-08-01: 3 mL via INTRAVENOUS

## 2011-08-01 MED ORDER — HEPARIN SODIUM (PORCINE) 1000 UNIT/ML DIALYSIS
5000.0000 [IU] | INTRAMUSCULAR | Status: DC | PRN
  Administered 2011-08-02: 5000 [IU] via INTRAVENOUS_CENTRAL
  Filled 2011-08-01: qty 5

## 2011-08-01 MED ORDER — CAMPHOR-MENTHOL 0.5-0.5 % EX LOTN
1.0000 "application " | TOPICAL_LOTION | Freq: Three times a day (TID) | CUTANEOUS | Status: DC | PRN
  Filled 2011-08-01: qty 222

## 2011-08-01 MED ORDER — ONDANSETRON HCL 4 MG PO TABS: 4.0000 mg | ORAL_TABLET | Freq: Four times a day (QID) | ORAL | Status: DC | PRN

## 2011-08-01 MED ORDER — FENTANYL CITRATE 0.05 MG/ML IJ SOLN: 25.0000 ug | INTRAMUSCULAR | Status: DC | PRN

## 2011-08-01 MED ORDER — ACETAMINOPHEN 650 MG RE SUPP: 650.0000 mg | Freq: Four times a day (QID) | RECTAL | Status: DC | PRN

## 2011-08-01 MED ORDER — HEPARIN SODIUM (PORCINE) 1000 UNIT/ML DIALYSIS
1000.0000 [IU] | INTRAMUSCULAR | Status: DC | PRN
  Filled 2011-08-01: qty 1

## 2011-08-01 MED ORDER — ONDANSETRON HCL 4 MG/2ML IJ SOLN
4.0000 mg | Freq: Four times a day (QID) | INTRAMUSCULAR | Status: DC | PRN
  Administered 2011-08-01: 4 mg via INTRAVENOUS

## 2011-08-01 MED ORDER — GLUCAGON HCL (RDNA) 1 MG IJ SOLR: 1.0000 mg | Freq: Once | INTRAMUSCULAR | Status: AC | PRN

## 2011-08-01 MED ORDER — INSULIN ASPART 100 UNIT/ML ~~LOC~~ SOLN
0.0000 [IU] | Freq: Three times a day (TID) | SUBCUTANEOUS | Status: DC
  Administered 2011-08-01: 3 [IU] via SUBCUTANEOUS
  Filled 2011-08-01: qty 3

## 2011-08-01 MED ORDER — SODIUM CHLORIDE 0.9 % IV SOLN: INTRAVENOUS | Status: DC

## 2011-08-01 MED ORDER — LIDOCAINE-PRILOCAINE 2.5-2.5 % EX CREA
1.0000 "application " | TOPICAL_CREAM | CUTANEOUS | Status: DC | PRN
  Filled 2011-08-01: qty 5

## 2011-08-01 MED ORDER — ALBUTEROL SULFATE HFA 108 (90 BASE) MCG/ACT IN AERS
2.0000 | INHALATION_SPRAY | RESPIRATORY_TRACT | Status: DC | PRN
  Filled 2011-08-01: qty 6.7

## 2011-08-01 MED ORDER — DARBEPOETIN ALFA-POLYSORBATE 25 MCG/0.42ML IJ SOLN
INTRAMUSCULAR | Status: AC
  Administered 2011-08-01: 25 ug via INTRAVENOUS
  Filled 2011-08-01: qty 0.42

## 2011-08-01 MED ORDER — AMLODIPINE BESYLATE 10 MG PO TABS
10.0000 mg | ORAL_TABLET | Freq: Every day | ORAL | Status: DC
  Filled 2011-08-01 (×2): qty 1

## 2011-08-01 MED ORDER — ONDANSETRON HCL 4 MG/2ML IJ SOLN
INTRAMUSCULAR | Status: AC
  Administered 2011-08-01: 4 mg via INTRAVENOUS
  Filled 2011-08-01: qty 2

## 2011-08-01 MED ORDER — AMLODIPINE BESYLATE 10 MG PO TABS
10.0000 mg | ORAL_TABLET | Freq: Every day | ORAL | Status: DC
  Administered 2011-08-01: 10 mg via ORAL
  Filled 2011-08-01: qty 1

## 2011-08-01 MED ORDER — CALCIUM CARBONATE 1250 MG/5ML PO SUSP
500.0000 mg | Freq: Four times a day (QID) | ORAL | Status: DC | PRN
  Filled 2011-08-01: qty 5

## 2011-08-01 MED ORDER — SORBITOL 70 % SOLN
30.0000 mL | Status: DC | PRN
  Filled 2011-08-01: qty 30

## 2011-08-01 MED ORDER — DOCUSATE SODIUM 283 MG RE ENEM
1.0000 | ENEMA | RECTAL | Status: DC | PRN
  Filled 2011-08-01: qty 1

## 2011-08-01 MED ORDER — CALCIUM ACETATE 667 MG PO CAPS
1334.0000 mg | ORAL_CAPSULE | Freq: Three times a day (TID) | ORAL | Status: DC
  Administered 2011-08-01: 1334 mg via ORAL
  Administered 2011-08-01: 2001 mg via ORAL
  Administered 2011-08-02: 1334 mg via ORAL
  Administered 2011-08-02: 2001 mg via ORAL
  Administered 2011-08-02: 1334 mg via ORAL
  Administered 2011-08-03: 2001 mg via ORAL
  Administered 2011-08-03: 1334 mg via ORAL
  Filled 2011-08-01 (×10): qty 3

## 2011-08-01 MED ORDER — MYCOPHENOLATE MOFETIL 250 MG PO CAPS
250.0000 mg | ORAL_CAPSULE | Freq: Two times a day (BID) | ORAL | Status: DC
  Administered 2011-08-01 – 2011-08-03 (×5): 250 mg via ORAL
  Filled 2011-08-01 (×7): qty 1

## 2011-08-01 MED ORDER — INSULIN ASPART 100 UNIT/ML ~~LOC~~ SOLN: 0.0000 [IU] | SUBCUTANEOUS | Status: DC

## 2011-08-01 MED ORDER — PARICALCITOL 5 MCG/ML IV SOLN
INTRAVENOUS | Status: AC
  Administered 2011-08-01: 5 ug via INTRAVENOUS
  Filled 2011-08-01: qty 1

## 2011-08-01 MED ORDER — LIDOCAINE HCL (PF) 1 % IJ SOLN: 5.0000 mL | INTRAMUSCULAR | Status: DC | PRN

## 2011-08-01 MED ORDER — INSULIN ASPART 100 UNIT/ML ~~LOC~~ SOLN
20.0000 [IU] | SUBCUTANEOUS | Status: AC
  Administered 2011-08-01: 20 [IU] via SUBCUTANEOUS

## 2011-08-01 MED ORDER — IRBESARTAN 150 MG PO TABS
150.0000 mg | ORAL_TABLET | Freq: Every day | ORAL | Status: DC
  Administered 2011-08-01: 150 mg via ORAL
  Filled 2011-08-01: qty 1

## 2011-08-01 MED ORDER — ASPIRIN 81 MG PO CHEW
81.0000 mg | CHEWABLE_TABLET | Freq: Every day | ORAL | Status: DC
  Administered 2011-08-01 – 2011-08-03 (×3): 81 mg via ORAL
  Filled 2011-08-01 (×3): qty 1

## 2011-08-01 MED ORDER — DARBEPOETIN ALFA-POLYSORBATE 25 MCG/0.42ML IJ SOLN
25.0000 ug | INTRAMUSCULAR | Status: DC
  Administered 2011-08-01: 25 ug via INTRAVENOUS
  Filled 2011-08-01: qty 0.42

## 2011-08-01 MED ORDER — ALTEPLASE 2 MG IJ SOLR
2.0000 mg | Freq: Once | INTRAMUSCULAR | Status: AC | PRN
  Filled 2011-08-01: qty 2

## 2011-08-01 MED ORDER — SODIUM CHLORIDE 0.9 % IV SOLN: 100.0000 mL | INTRAVENOUS | Status: DC | PRN

## 2011-08-01 MED ORDER — TACROLIMUS 1 MG PO CAPS
1.0000 mg | ORAL_CAPSULE | Freq: Two times a day (BID) | ORAL | Status: DC
  Administered 2011-08-01 – 2011-08-03 (×5): 1 mg via ORAL
  Filled 2011-08-01 (×7): qty 1

## 2011-08-01 MED ORDER — DEXTROSE 50 % IV SOLN: 25.0000 mL | INTRAVENOUS | Status: DC | PRN

## 2011-08-01 MED ORDER — HYDROCODONE-ACETAMINOPHEN 5-325 MG PO TABS
1.0000 | ORAL_TABLET | Freq: Four times a day (QID) | ORAL | Status: DC | PRN
  Administered 2011-08-01 – 2011-08-03 (×5): 1 via ORAL
  Filled 2011-08-01 (×5): qty 1

## 2011-08-01 MED ORDER — INSULIN GLARGINE 100 UNIT/ML ~~LOC~~ SOLN
3.0000 [IU] | Freq: Two times a day (BID) | SUBCUTANEOUS | Status: DC
  Administered 2011-08-01: 3 [IU] via SUBCUTANEOUS
  Filled 2011-08-01: qty 3

## 2011-08-01 MED ORDER — ACETAMINOPHEN 325 MG PO TABS
650.0000 mg | ORAL_TABLET | Freq: Four times a day (QID) | ORAL | Status: DC | PRN
  Administered 2011-08-01 (×2): 650 mg via ORAL
  Filled 2011-08-01 (×2): qty 2

## 2011-08-01 MED ORDER — HYDROXYZINE HCL 25 MG PO TABS
25.0000 mg | ORAL_TABLET | Freq: Three times a day (TID) | ORAL | Status: DC | PRN
  Filled 2011-08-01: qty 1

## 2011-08-01 MED ORDER — PREDNISONE 5 MG PO TABS
5.0000 mg | ORAL_TABLET | Freq: Every day | ORAL | Status: DC
  Administered 2011-08-01 – 2011-08-03 (×3): 5 mg via ORAL
  Filled 2011-08-01 (×4): qty 1

## 2011-08-01 MED ORDER — AMLODIPINE BESYLATE-VALSARTAN 10-160 MG PO TABS: 1.0000 | ORAL_TABLET | Freq: Every day | ORAL | Status: DC

## 2011-08-01 MED ORDER — PARICALCITOL 5 MCG/ML IV SOLN
5.0000 ug | INTRAVENOUS | Status: DC
  Administered 2011-08-01 – 2011-08-02 (×2): 5 ug via INTRAVENOUS
  Filled 2011-08-01 (×2): qty 1

## 2011-08-01 MED ORDER — PENTAFLUOROPROP-TETRAFLUOROETH EX AERO: 1.0000 "application " | INHALATION_SPRAY | CUTANEOUS | Status: DC | PRN

## 2011-08-01 MED ORDER — ZOLPIDEM TARTRATE 5 MG PO TABS
5.0000 mg | ORAL_TABLET | Freq: Every evening | ORAL | Status: DC | PRN
  Administered 2011-08-02 – 2011-08-03 (×2): 5 mg via ORAL
  Filled 2011-08-01 (×2): qty 1

## 2011-08-01 MED ORDER — METOPROLOL SUCCINATE ER 100 MG PO TB24
100.0000 mg | ORAL_TABLET | Freq: Every day | ORAL | Status: DC
  Administered 2011-08-01 – 2011-08-02 (×3): 100 mg via ORAL
  Filled 2011-08-01 (×4): qty 1

## 2011-08-01 MED ORDER — IRBESARTAN 150 MG PO TABS
150.0000 mg | ORAL_TABLET | Freq: Every day | ORAL | Status: DC
  Administered 2011-08-02 – 2011-08-03 (×2): 150 mg via ORAL
  Filled 2011-08-01 (×2): qty 1

## 2011-08-01 MED ORDER — GLUCAGON HCL (RDNA) 1 MG IJ SOLR: 0.5000 mg | Freq: Once | INTRAMUSCULAR | Status: AC | PRN

## 2011-08-01 MED ORDER — FENTANYL CITRATE 0.05 MG/ML IJ SOLN
25.0000 ug | INTRAMUSCULAR | Status: DC | PRN
  Administered 2011-08-01: 25 ug via INTRAVENOUS
  Filled 2011-08-01: qty 2

## 2011-08-01 MED ORDER — NEPRO/CARBSTEADY PO LIQD
237.0000 mL | Freq: Three times a day (TID) | ORAL | Status: DC | PRN
  Filled 2011-08-01: qty 237

## 2011-08-01 NOTE — Progress Notes (Signed)
Patient has had 4 consecutive CBG below target range. Craige Cotta, PA on call notified. See new orders given. Will continue to monitor.

## 2011-08-01 NOTE — Progress Notes (Signed)
Chaplain responded to a spiritual care consult. Patient was not available. Chaplain will attempt another visit at a later time.

## 2011-08-01 NOTE — Consult Note (Signed)
[facility] Renal Consultation Note  Indication for Consultation:  Management of ESRD/hemodialysis; anemia, hypertension/volume and secondary hyperparathyroidism  HPI: [name] is a 41 y.o.AA female with ESRD on chronic HD support every TTS at [facility] and follow by Dr. Eliott Nine. She was recently admitted with questionable viral gastroenteritis and discharged 07/17/11. PMH DM, and gastroparesis who presents to the ED with nausea, vomiting, abdominal pain, subjective fevers, headache, but no diarrhea. She also had an elevated glucose of 464 on admission requiring an insulin drip which has now resolved and the insulin discontinued. She currently denies any discomfort and it is likely that her symptoms were related to her hyperglycemia on adm. Renal Consult has been requested secondary to the need for ongoing RRT. She missed her regular scheduled HD treatment on yesterday and underwent HD this am off schedule. We will plan to resume her outpatient treatment regimen with her next HD treatment tomorrow.   Dialysis Orders: Center: [facility] on TTS. EDW 71Kg HD Bath 2K/2.5Ca  Time 3:45 Heparin 5,000. Access RUA AVF BFR 400 DFR A1.5    Zemplar IV/HD Epogen 6,200 Units IV/HD  Venofer 0 (T-Sat 57% on 2/28) Other  Past Medical History  Diagnosis Date  . Diabetes mellitus type 1     Pt states diagnosed at age 80 with prior episodes of DKA. S/p pancreatic transplant   . Hypertension   . Hyperlipidemia   . Alopecia   . GERD (gastroesophageal reflux disease)   . Obesity   . Iron deficiency   . 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 RLS (restless legs syndrome)   . Respiratory failure   . Injury of conjunctiva and corneal abrasion of right eye without foreign body   . Cellulitis   . Dialysis patient     Tues; Thurs; Sat; [place]  . Angina   . Depression   . Immunosuppression 08/01/11    currently takes  Prednisone, MMF, and tacrolimus   . Anemia   . Pneumonia 2012    "double"  . Blood transfusion 2004    "when I had my transplant"  . Migraine    Past Surgical History  Procedure Date  . 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 Thyroglobulin     x 7  . Av fistula placement     right upper arm  . Cholecystectomy 1995  .  hd graft placement/removal     "had 2 in my left upper arm"  . Eye surgery   . Retinopathy surgery    History reviewed. No pertinent family history.  reports that she has never smoked. She has never used smokeless tobacco. She reports that she does not drink alcohol or use illicit drugs. Allergies  Allergen Reactions  . Morphine And Related Nausea And Vomiting and Other (See Comments)    "makes me delerirous"  . Penicillins Rash  . Flagyl (Metronidazole Hcl) Nausea And Vomiting  . Tramadol Nausea And Vomiting   Prior to Admission medications   Medication Sig Start Date End Date Taking? Authorizing Provider  albuterol (PROVENTIL HFA;VENTOLIN HFA) 108 (90 BASE) MCG/ACT inhaler Inhale 2 puffs into the lungs every 4 (four) hours as needed. For wheezing.  04/23/11 04/22/12 Yes Sandrea Hughs, MD  amLODipine-valsartan (EXFORGE) 10-160 MG per tablet Take 1 tablet by mouth daily. 06/19/11 06/18/12 Yes Sandrea Hughs, MD  aspirin 81 MG tablet  Take 81 mg by mouth daily.     Yes Historical Provider, MD  calcium acetate (PHOSLO) 667 MG capsule Take 1,334-2,001 mg by mouth 3 (three) times daily with meals. Dosage depends on meal consumed 03/12/11  Yes Historical Provider, MD  clonazePAM (KLONOPIN) 0.5 MG tablet Take 2 mg by mouth at bedtime. For anxiety   Yes Historical Provider, MD  cyclobenzaprine (FLEXERIL) 5 MG tablet Take 1 tablet by mouth Three times daily as needed. For muscle spasms. 01/04/11  Yes Historical Provider, MD  LANTUS SOLOSTAR 100 UNIT/ML injection Inject 7 Units into the skin 2 (two) times daily.  02/14/11  Yes Historical Provider,  MD  metoprolol succinate (TOPROL-XL) 100 MG 24 hr tablet Take 100 mg by mouth at bedtime. Do not take the night before dialysis. Dialysis on Tues, Thurs & Sat.   Yes Historical Provider, MD  mycophenolate (CELLCEPT) 250 MG capsule Take 250 mg by mouth 2 (two) times daily.    Yes Historical Provider, MD  NOVOLOG FLEXPEN 100 UNIT/ML injection Inject 3 Units into the skin 3 (three) times daily as needed. As directed. Per sliding scale.  >170= 2 units (on top of the three) >200= 3 units (on top of the three) >300= 4 units (on top of the three) >400= 5 units (on top of the three) >500= call MD Only uses if she eats. 02/14/11  Yes Historical Provider, MD  pantoprazole (PROTONIX) 40 MG tablet Take 1 tablet by mouth Every morning. 03/12/11  Yes Historical Provider, MD  predniSONE (DELTASONE) 5 MG tablet Take 1 tablet by mouth daily. 01/21/11  Yes Historical Provider, MD  promethazine (PHENERGAN) 25 MG tablet Take 25 mg by mouth 2 (two) times daily as needed. For nausea. 03/21/11  Yes Historical Provider, MD  tacrolimus (PROGRAF) 1 MG capsule Take 1 mg by mouth 2 (two) times daily.     Yes Historical Provider, MD  zolpidem (AMBIEN) 10 MG tablet Take 1 tablet by mouth at bedtime. For sleep. 03/14/11  Yes Historical Provider, MD   I have reviewed the patient's current medications. Results for orders placed during the hospital encounter of 07/31/11 (from the past 48 hour(s))  BASIC METABOLIC PANEL     Status: Abnormal   Collection Time   07/31/11  9:24 PM      Component Value Range Comment   Sodium 128 (*) 135 - 145 (mEq/L)    Potassium 4.3  3.5 - 5.1 (mEq/L)    Chloride 90 (*) 96 - 112 (mEq/L)    CO2 15 (*) 19 - 32 (mEq/L)    Glucose, Bld 464 (*) 70 - 99 (mg/dL)    BUN 95 (*) 6 - 23 (mg/dL)    Creatinine, Ser 16.10 (*) 0.50 - 1.10 (mg/dL)    Calcium 8.7  8.4 - 10.5 (mg/dL)    GFR calc non Af Amer 3 (*) >90 (mL/min)    GFR calc Af Amer 4 (*) >90 (mL/min)   CBC     Status: Abnormal   Collection Time     07/31/11 10:54 PM      Component Value Range Comment   WBC 9.7  4.0 - 10.5 (K/uL)    RBC 3.51 (*) 3.87 - 5.11 (MIL/uL)    Hemoglobin 11.0 (*) 12.0 - 15.0 (g/dL)    HCT 96.0 (*) 45.4 - 46.0 (%)    MCV 91.5  78.0 - 100.0 (fL)    MCH 31.3  26.0 - 34.0 (pg)    MCHC 34.3  30.0 - 36.0 (  g/dL)    RDW 16.1 (*) 09.6 - 15.5 (%)    Platelets 263  150 - 400 (K/uL)   POCT I-STAT 3, BLOOD GAS (G3P V)     Status: Abnormal   Collection Time   07/31/11 11:04 PM      Component Value Range Comment   pH, Ven 7.318 (*) 7.250 - 7.300     pCO2, Ven 34.4 (*) 45.0 - 50.0 (mmHg)    pO2, Ven 59.0 (*) 30.0 - 45.0 (mmHg)    Bicarbonate 17.7 (*) 20.0 - 24.0 (mEq/L)    TCO2 19  0 - 100 (mmol/L)    O2 Saturation 88.0      Acid-base deficit 8.0 (*) 0.0 - 2.0 (mmol/L)    Sample type VENOUS     GLUCOSE, CAPILLARY     Status: Abnormal   Collection Time   07/31/11 11:38 PM      Component Value Range Comment   Glucose-Capillary 398 (*) 70 - 99 (mg/dL)   GLUCOSE, CAPILLARY     Status: Abnormal   Collection Time   08/01/11 12:32 AM      Component Value Range Comment   Glucose-Capillary 312 (*) 70 - 99 (mg/dL)   GLUCOSE, CAPILLARY     Status: Abnormal   Collection Time   08/01/11  1:39 AM      Component Value Range Comment   Glucose-Capillary 137 (*) 70 - 99 (mg/dL)    Comment 1 Documented in Chart      Comment 2 Notify RN     GLUCOSE, CAPILLARY     Status: Abnormal   Collection Time   08/01/11  2:02 AM      Component Value Range Comment   Glucose-Capillary 114 (*) 70 - 99 (mg/dL)    Comment 1 Notify RN     GLUCOSE, CAPILLARY     Status: Normal   Collection Time   08/01/11  2:43 AM      Component Value Range Comment   Glucose-Capillary 92  70 - 99 (mg/dL)   GLUCOSE, CAPILLARY     Status: Normal   Collection Time   08/01/11  3:48 AM      Component Value Range Comment   Glucose-Capillary 83  70 - 99 (mg/dL)   GLUCOSE, CAPILLARY     Status: Abnormal   Collection Time   08/01/11  4:49 AM      Component Value Range  Comment   Glucose-Capillary 132 (*) 70 - 99 (mg/dL)    Comment 1 Notify RN     BASIC METABOLIC PANEL     Status: Abnormal   Collection Time   08/01/11  5:36 AM      Component Value Range Comment   Sodium 135  135 - 145 (mEq/L)    Potassium 4.4  3.5 - 5.1 (mEq/L)    Chloride 97  96 - 112 (mEq/L)    CO2 16 (*) 19 - 32 (mEq/L)    Glucose, Bld 145 (*) 70 - 99 (mg/dL)    BUN 96 (*) 6 - 23 (mg/dL)    Creatinine, Ser 04.54 (*) 0.50 - 1.10 (mg/dL)    Calcium 7.9 (*) 8.4 - 10.5 (mg/dL)    GFR calc non Af Amer 3 (*) >90 (mL/min)    GFR calc Af Amer 4 (*) >90 (mL/min)   CBC     Status: Abnormal   Collection Time   08/01/11  5:36 AM      Component Value Range Comment   WBC  10.5  4.0 - 10.5 (K/uL)    RBC 3.30 (*) 3.87 - 5.11 (MIL/uL)    Hemoglobin 10.2 (*) 12.0 - 15.0 (g/dL)    HCT 16.1 (*) 09.6 - 46.0 (%)    MCV 91.5  78.0 - 100.0 (fL)    MCH 30.9  26.0 - 34.0 (pg)    MCHC 33.8  30.0 - 36.0 (g/dL)    RDW 04.5 (*) 40.9 - 15.5 (%)    Platelets 235  150 - 400 (K/uL)   GLUCOSE, CAPILLARY     Status: Abnormal   Collection Time   08/01/11  6:13 AM      Component Value Range Comment   Glucose-Capillary 144 (*) 70 - 99 (mg/dL)    ROS: as noted in the HPI; otherwise negative   Physical Exam: Filed Vitals:   08/01/11 0621  BP: 148/73  Pulse: 83  Temp: 98.3 F (36.8 C)  Resp: 18     General: comfortable HEENT: atraumatic, normocephalic Eyes: nonicteric Neck: supple Heart: RRRR, 2/6 SEM Lungs: CTAB Abdomen: obese, soft, slight diffuse tenderness more lower abdominal, ND, +BS Extremities: no ankle edema Skin: intact (old scars) Neuro: alert, Ox 3; pleasant affect Dialysis Access: RUA AVF; +T/B  Assessment/Plan: 1. Nausea, Vomiting, headache, abdominal pain- improved 2. ESRD-  TTS [facility]; HD off schedule today; plan for short treatment tomorrow (3hrs) to resume previous outpt treatment regimen  3 Hypertension/volume  - same outpatient BP meds and MAx  UF with each HD treatment 4. Anemia   - Hgb 10.2; same outpatient ESA 5. Metabolic bone disease - same outpatient binders (Phoslo) and Zemplar; follow ca/phos 6. Nutrition - appetite good; ^ protein diet, protein suppl 7. DM- Insulin drip (now d/c'd); CBG and SSI per primary services 8. S/p failed renal and pancreatic transplant- remains on prograf, prednisone, and cellcept  9. Disposition- per primary services; anticipated soon; plans for HD first run in am  Samuel Germany, [facility] Pager [phone] 08/01/2011, 8:32 AM   Attending Nephrologist: Dr. Casimiro Needle  Patient with symptoms delineated above.  Agree with evaluation, assessment and plan.  Dialysis as inpatient today with repeat in AM to get back on usual schedule. Chandan Fly C

## 2011-08-01 NOTE — Progress Notes (Signed)
Patient arrived to unit from ED.  Patient alert and oriented. Patient oriented to room. Patient states having a headache at this time rating it a 6/10. Pain medication given prior to patient arriving to unit. Patient vomited upon arriving to unit. Patient now states that she feels better. Patient blood sugar checked. CBG 114. IV insulin on hold per glucostablizer due to patient CBG results prior arrival. Tele monitor on. Vitals assessed. Will continue to monitor.

## 2011-08-01 NOTE — Progress Notes (Signed)
Very poor venous access/l arm only/hx of central lines via ir/floor rn aware/reccommend central line/Corrinna Karapetyan Poff RN

## 2011-08-01 NOTE — H&P (Signed)
PCP:   Talmage Coin, MD, MD  Pt identified [name] in Endocrinology as PCP Also Dr. Sherene Sires in pulmonology, Dr. Eliott Nine at HD.   Chief Complaint:  Fevers, chills, headache, poorly controlled blood sugars   HPI: 40yoF with h/o ESRD s/p failed pancreatic/renal transplant, on HD (T/T/S) since 2008, type 1  diabetes since 41 yrs old with h/o DKA, now presents with several weeks of poor blood sugar control  and low midline abdominal pain, and several days of subjective fevers, chills, severe headache,  anorexia and decreased PO intake, and found to have elevated blood sugars and metabolic acidosis.   She was last admitted to [facility] on 2/17 with 1-2 wks of cough, fevers, nausea, diarrhea, poorly  controlled BS's 200-500. She was thought to have viral gastroenteritis given Norovirus in the  community. She had some acidosis on BMET. She was discharged and states she felt better but still  not 100% by discharge. The diarrhea has totally resolved since then.   She is quite pleasant and an accurate historian. She has various complaints, the most prominent  are several days worth of subjective fevers and chills (Tmax measured at home was 100.1) and a  severe bi-frontal headache without any neurological phenomenon, posterior headache, or neck  stiffness. She has baseline headaches, this is just more severe. She has been having decreased  appetite and poor PO's. She also states that for the past 3 weeks she's had low midline,  suprapubic abdominal pain worse with palpation, and is not associated with PO intake or decreased  bowel changes or flatus. Finally, she states her blood sugars have been very brittle and all over  the place, and she feels her endocrinologist is giving her too little insulin.   In the ED, vitals overall stable although hypertense to 186/95 and tachy to 102. Labs with hypoNa  128, hypoCl 90, HCO3 15, renal 95/12.65, and glucose 464. VBG was 7.318/CO2 34/O2 59/HCO3 18. CBC  overall  stable. Pt has been given 1L NS, 10mg  Reglan, 1mg  Dilaudid, started glucose stabilizer.   ROS as above, otherwise she has also been seeing Dr. Sherene Sires recently for a dry, non productive cough  for which he stopped ACEi in 02/2011 and gave her albuterol, without much improvement. She missed  HD on Tuesday due to feeling poorly but endorses good compliance with it otherwise. She has  occasional fleeting minimal chest pain that was present on last admission during which she ruled  out. ROS otherwise negative.   Past Medical History  Diagnosis Date  . Diabetes mellitus   . Hypertension   . Renal insufficiency   . Hyperlipidemia   . Alopecia   . GERD (gastroesophageal reflux disease)   . Obesity   . Iron deficiency   . ESRD (end stage renal disease)   . RLS (restless legs syndrome)   . Respiratory failure   . Injury of conjunctiva and corneal abrasion of right eye without foreign body   . Cellulitis   . Dialysis patient     Tues; Thurs; Sat; [place]  . Angina   . Depression   . Poorly controlled type 1 diabetes mellitus   . Immunosuppression 08/01/11    currently takes RX  . Anemia   . Pneumonia 2012    "double"  . Blood transfusion 2004    "when I had my transplant"  . Migraine     Past Surgical History  Procedure Date  . 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 Thyroglobulin     x 7  . Av fistula placement     right upper arm  . Cholecystectomy 1995  .  hd graft placement/removal     "had 2 in my left upper arm"  . Eye surgery   . Retinopathy surgery     Medications:  HOME MEDS:  Reconciled with patient by me.  Prior to Admission medications   Medication Sig Start Date End Date Taking? Authorizing Provider  albuterol (PROVENTIL HFA;VENTOLIN HFA) 108 (90 BASE) MCG/ACT inhaler Inhale 2 puffs into the lungs every 4 (four) hours as needed. For wheezing.  04/23/11 04/22/12 Yes Sandrea Hughs, MD  amLODipine-valsartan (EXFORGE) 10-160  MG per tablet Take 1 tablet by mouth daily. 06/19/11 06/18/12 Yes Sandrea Hughs, MD  aspirin 81 MG tablet Take 81 mg by mouth daily.     Yes Historical Provider, MD  calcium acetate (PHOSLO) 667 MG capsule Take 1,334-2,001 mg by mouth 3 (three) times daily with meals. Dosage depends on meal consumed 03/12/11  Yes Historical Provider, MD  clonazePAM (KLONOPIN) 0.5 MG tablet Take 2 mg by mouth at bedtime. For anxiety   Yes Historical Provider, MD  cyclobenzaprine (FLEXERIL) 5 MG tablet Take 1 tablet by mouth Three times daily as needed. For muscle spasms. 01/04/11  Yes Historical Provider, MD  LANTUS SOLOSTAR 100 UNIT/ML injection Inject 7 Units into the skin 2 (two) times daily.  02/14/11  Yes Historical Provider, MD  metoprolol succinate (TOPROL-XL) 100 MG 24 hr tablet Take 100 mg by mouth at bedtime. Do not take the night before dialysis. Dialysis on Tues, Thurs & Sat.   Yes Historical Provider, MD  mycophenolate (CELLCEPT) 250 MG capsule Take 250 mg by mouth 2 (two) times daily.    Yes Historical Provider, MD  NOVOLOG FLEXPEN 100 UNIT/ML injection Inject 3 Units into the skin 3 (three) times daily as needed. As directed. Per sliding scale.  >170= 2 units (on top of the three) >200= 3 units (on top of the three) >300= 4 units (on top of the three) >400= 5 units (on top of the three) >500= call MD Only uses if she eats. 02/14/11  Yes Historical Provider, MD  pantoprazole (PROTONIX) 40 MG tablet Take 1 tablet by mouth Every morning. 03/12/11  Yes Historical Provider, MD  predniSONE (DELTASONE) 5 MG tablet Take 1 tablet by mouth daily. 01/21/11  Yes Historical Provider, MD  promethazine (PHENERGAN) 25 MG tablet Take 25 mg by mouth 2 (two) times daily as needed. For nausea. 03/21/11  Yes Historical Provider, MD  tacrolimus (PROGRAF) 1 MG capsule Take 1 mg by mouth 2 (two) times daily.     Yes Historical Provider, MD  zolpidem (AMBIEN) 10 MG tablet Take 1 tablet by mouth at bedtime. For sleep. 03/14/11  Yes  Historical Provider, MD    Allergies:  Allergies  Allergen Reactions  . Morphine And Related Nausea And Vomiting and Other (See Comments)    "makes me delerirous"  . Penicillins Rash  . Flagyl (Metronidazole Hcl) Nausea And Vomiting  . Tramadol Nausea And Vomiting    Social History:   reports that she has never smoked. She has never used smokeless tobacco. She reports that she does not drink alcohol or use illicit drugs. Lives at home with fiance, has a 17yo daughter. Ambulatory without cane or walker. Never smoker, no drugs or alcohol. Lost mom last year, has no brothers or sisters.   Family History: History reviewed. No pertinent family history.  Physical Exam: Filed Vitals:   07/31/11 2011 07/31/11 2031 07/31/11 2208 08/01/11 0022  BP: 186/95 182/102 172/99 170/90  Pulse: 98 102 97 94  Temp: 98 F (36.7 C) 98.3 F (36.8 C) 98.7 F (37.1 C) 98 F (36.7 C)  TempSrc:  Oral Oral Oral  Resp: 16 20 20 20   SpO2: 97% 100% 97% 98%   Blood pressure 170/90, pulse 94, temperature 98 F (36.7 C), temperature source Oral, resp. rate 20, last menstrual period 07/13/2011, SpO2 98.00%.  Gen: Short, overweight but not obese F in no distress, breathing comfortably, pleasant and nice,  able to relate history very well. No distress HEENT: Pupils round and reactive, scleral muddying noted, EOMI, has baseline left eye exotropia.  Mouth moist and normal appearing, no apparent lesions Lungs: CTAB no w/c/r, normal sounding lungs, good air movement.  Heart: Regular, not tachycardic, with an early peaking systolic murmur at LUSB, not heard at apex Abd: Soft, with prominent surgical scars in the midline and low transverse across BLQ's. She  endorses TTP but doesn't have facial grimacing or overt pain when the low midline, suprapubic area  of pain is palpated. Not rigid or distended, overall seems benign Extrem: Warm, well perfusing, radials palpable. Prominent fistula scars bilateral arms, but RUE   has thrill and is active, left was removed. BLE's without edema, are warm and normal but ashey.  Neuro: Alert, attentive, conversant, quite nice. CN 2-12 intact but left eye outward gaze, rest of  CN 2-12 grossly intact, is sitting up in bed, moving arms/legs spontaneously. Grossly overall non  focal.    Labs & Imaging Results for orders placed during the hospital encounter of 07/31/11 (from the past 48 hour(s))  BASIC METABOLIC PANEL     Status: Abnormal   Collection Time   07/31/11  9:24 PM      Component Value Range Comment   Sodium 128 (*) 135 - 145 (mEq/L)    Potassium 4.3  3.5 - 5.1 (mEq/L)    Chloride 90 (*) 96 - 112 (mEq/L)    CO2 15 (*) 19 - 32 (mEq/L)    Glucose, Bld 464 (*) 70 - 99 (mg/dL)    BUN 95 (*) 6 - 23 (mg/dL)    Creatinine, Ser 81.19 (*) 0.50 - 1.10 (mg/dL)    Calcium 8.7  8.4 - 10.5 (mg/dL)    GFR calc non Af Amer 3 (*) >90 (mL/min)    GFR calc Af Amer 4 (*) >90 (mL/min)   CBC     Status: Abnormal   Collection Time   07/31/11 10:54 PM      Component Value Range Comment   WBC 9.7  4.0 - 10.5 (K/uL)    RBC 3.51 (*) 3.87 - 5.11 (MIL/uL)    Hemoglobin 11.0 (*) 12.0 - 15.0 (g/dL)    HCT 14.7 (*) 82.9 - 46.0 (%)    MCV 91.5  78.0 - 100.0 (fL)    MCH 31.3  26.0 - 34.0 (pg)    MCHC 34.3  30.0 - 36.0 (g/dL)    RDW 56.2 (*) 13.0 - 15.5 (%)    Platelets 263  150 - 400 (K/uL)   POCT I-STAT 3, BLOOD GAS (G3P V)     Status: Abnormal   Collection Time   07/31/11 11:04 PM      Component Value Range Comment   pH, Ven 7.318 (*) 7.250 - 7.300     pCO2, Ven 34.4 (*) 45.0 - 50.0 (mmHg)    pO2,  Ven 59.0 (*) 30.0 - 45.0 (mmHg)    Bicarbonate 17.7 (*) 20.0 - 24.0 (mEq/L)    TCO2 19  0 - 100 (mmol/L)    O2 Saturation 88.0      Acid-base deficit 8.0 (*) 0.0 - 2.0 (mmol/L)    Sample type VENOUS     GLUCOSE, CAPILLARY     Status: Abnormal   Collection Time   07/31/11 11:38 PM      Component Value Range Comment   Glucose-Capillary 398 (*) 70 - 99 (mg/dL)   GLUCOSE, CAPILLARY      Status: Abnormal   Collection Time   08/01/11 12:32 AM      Component Value Range Comment   Glucose-Capillary 312 (*) 70 - 99 (mg/dL)    No results found.  Impression Present on Admission:  .DKA, type 1 .ESRD (end stage renal disease) .Abdominal pain .Headache .Chills with fever  40yoF with h/o ESRD s/p failed pancreatic/renal transplant, on HD (T/T/S) since 2008, type 1  diabetes since 41 yrs old with h/o DKA, now presents with several weeks of poor blood sugar control  and low midline abdominal pain, and several days of subjective fevers, chills, severe headache,  anorexia and decreased PO intake, and found to have elevated blood sugars and metabolic acidosis.  1. Hyperglycemia and metabolic acidosis: In longstanding type 1 diabetic, obviously concerning for  DKA. DDx includes metabolic acidosis due to ERSD, however review of prior HCO3 shows she usually  lives in the mid to high 20's, so I think this probably is DKA, albeit not severe at present.   Of note, various somatic complaints as below could all be due to DKA.   - UA to assess ketones and infection. CXR. Will give very gentle IVF's (now s/p 500cc, will give  125/hr for 1L total) given HD dependent and continue glucose stabilizer.  - Holding home subQ insulin for now   2. Abdominal pain: Pt with prominent surgical scars, so DDx includes adhesions, but history not  supportive of obstruction. Get abdominal plain film to r/o obstruction although suspect this is  low yield. If persistent or not improved with DKA treatment, consider a CT abdomen but hold on  this for now  3. Subjective fevers and chills: WBC count low and diarrhea from prior admission now resolved. Pt  is immunosuppressed. Get UA, CXR, BCx x1, hold on empiric ABx for now.   4. ESRD: Pt's renal fxn through most of last year was BUN 15-40 / Cr 4-8, so current values of  95/12.65 does seem higher than prior baseline, although she did have similar values while  admitted  and feeling poorly in 06/2011. She missed HD on Tuesday due to feeling poorly, likely will need HD  on Wednesday.   - Alert renal for HD and consider official consult if not improving with DKA treatment to broaden  the differential for renal transplant / immunosuppressed pt. Consider getting tacrolimus level?  - Continue home tacrolimus, cellcept, phoslo, prednisone 5  5. HTN: continue home exforge, toprol, ASA 81. Pt currently off ACEi for chronic cough.   Telemetry, MC team 7 Full code, discussed.   Other plans as per orders.  [name] 08/01/2011, 1:02 AM

## 2011-08-01 NOTE — Progress Notes (Signed)
Subjective: Patient seen and examined, complaining of headache similar to her chronic headache, does not seems to be on distress   Objective: Vital signs in last 24 hours: Temp:  [97.3 F (36.3 C)-98.7 F (37.1 C)] 98 F (36.7 C) (03/06 1449) Pulse Rate:  [68-107] 107  (03/06 1449) Resp:  [12-20] 18  (03/06 1449) BP: (130-186)/(73-102) 158/93 mmHg (03/06 1449) SpO2:  [97 %-100 %] 100 % (03/06 1449) Weight:  [71.1 kg (156 lb 12 oz)-76.4 kg (168 lb 6.9 oz)] 71.1 kg (156 lb 12 oz) (03/06 1338) Weight change:  Last BM Date: 08/01/11  Intake/Output from previous day: 03/05 0701 - 03/06 0700 In: 288 [P.O.:120; I.V.:168] Out: 200 [Emesis/NG output:200]     Physical Exam: General: Alert, awake, oriented x3, in no acute distress.Marland Kitchen Heart: Regular rate and rhythm, without murmurs, rubs, gallops. Lungs: Clear to auscultation bilaterally. Abdomen: Soft, mild lower abdomen tenders with no rebound or guarding, nondistended, positive bowel sounds. Extremities: No clubbing cyanosis or edema with positive pedal pulses. Neuro: Grossly intact, nonfocal.    Lab Results: Results for orders placed during the hospital encounter of 07/31/11 (from the past 24 hour(s))  BASIC METABOLIC PANEL     Status: Abnormal   Collection Time   07/31/11  9:24 PM      Component Value Range   Sodium 128 (*) 135 - 145 (mEq/L)   Potassium 4.3  3.5 - 5.1 (mEq/L)   Chloride 90 (*) 96 - 112 (mEq/L)   CO2 15 (*) 19 - 32 (mEq/L)   Glucose, Bld 464 (*) 70 - 99 (mg/dL)   BUN 95 (*) 6 - 23 (mg/dL)   Creatinine, Ser 16.10 (*) 0.50 - 1.10 (mg/dL)   Calcium 8.7  8.4 - 96.0 (mg/dL)   GFR calc non Af Amer 3 (*) >90 (mL/min)   GFR calc Af Amer 4 (*) >90 (mL/min)  CBC     Status: Abnormal   Collection Time   07/31/11 10:54 PM      Component Value Range   WBC 9.7  4.0 - 10.5 (K/uL)   RBC 3.51 (*) 3.87 - 5.11 (MIL/uL)   Hemoglobin 11.0 (*) 12.0 - 15.0 (g/dL)   HCT 45.4 (*) 09.8 - 46.0 (%)   MCV 91.5  78.0 - 100.0 (fL)   MCH 31.3  26.0 - 34.0 (pg)   MCHC 34.3  30.0 - 36.0 (g/dL)   RDW 11.9 (*) 14.7 - 15.5 (%)   Platelets 263  150 - 400 (K/uL)  POCT I-STAT 3, BLOOD GAS (G3P V)     Status: Abnormal   Collection Time   07/31/11 11:04 PM      Component Value Range   pH, Ven 7.318 (*) 7.250 - 7.300    pCO2, Ven 34.4 (*) 45.0 - 50.0 (mmHg)   pO2, Ven 59.0 (*) 30.0 - 45.0 (mmHg)   Bicarbonate 17.7 (*) 20.0 - 24.0 (mEq/L)   TCO2 19  0 - 100 (mmol/L)   O2 Saturation 88.0     Acid-base deficit 8.0 (*) 0.0 - 2.0 (mmol/L)   Sample type VENOUS    GLUCOSE, CAPILLARY     Status: Abnormal   Collection Time   07/31/11 11:38 PM      Component Value Range   Glucose-Capillary 398 (*) 70 - 99 (mg/dL)  GLUCOSE, CAPILLARY     Status: Abnormal   Collection Time   08/01/11 12:32 AM      Component Value Range   Glucose-Capillary 312 (*) 70 - 99 (mg/dL)  GLUCOSE, CAPILLARY     Status: Abnormal   Collection Time   08/01/11  1:39 AM      Component Value Range   Glucose-Capillary 137 (*) 70 - 99 (mg/dL)   Comment 1 Documented in Chart     Comment 2 Notify RN    GLUCOSE, CAPILLARY     Status: Abnormal   Collection Time   08/01/11  2:02 AM      Component Value Range   Glucose-Capillary 114 (*) 70 - 99 (mg/dL)   Comment 1 Notify RN    GLUCOSE, CAPILLARY     Status: Normal   Collection Time   08/01/11  2:43 AM      Component Value Range   Glucose-Capillary 92  70 - 99 (mg/dL)  GLUCOSE, CAPILLARY     Status: Normal   Collection Time   08/01/11  3:48 AM      Component Value Range   Glucose-Capillary 83  70 - 99 (mg/dL)  GLUCOSE, CAPILLARY     Status: Abnormal   Collection Time   08/01/11  4:49 AM      Component Value Range   Glucose-Capillary 132 (*) 70 - 99 (mg/dL)   Comment 1 Notify RN    BASIC METABOLIC PANEL     Status: Abnormal   Collection Time   08/01/11  5:36 AM      Component Value Range   Sodium 135  135 - 145 (mEq/L)   Potassium 4.4  3.5 - 5.1 (mEq/L)   Chloride 97  96 - 112 (mEq/L)   CO2 16 (*) 19 - 32 (mEq/L)    Glucose, Bld 145 (*) 70 - 99 (mg/dL)   BUN 96 (*) 6 - 23 (mg/dL)   Creatinine, Ser 16.10 (*) 0.50 - 1.10 (mg/dL)   Calcium 7.9 (*) 8.4 - 10.5 (mg/dL)   GFR calc non Af Amer 3 (*) >90 (mL/min)   GFR calc Af Amer 4 (*) >90 (mL/min)  CBC     Status: Abnormal   Collection Time   08/01/11  5:36 AM      Component Value Range   WBC 10.5  4.0 - 10.5 (K/uL)   RBC 3.30 (*) 3.87 - 5.11 (MIL/uL)   Hemoglobin 10.2 (*) 12.0 - 15.0 (g/dL)   HCT 96.0 (*) 45.4 - 46.0 (%)   MCV 91.5  78.0 - 100.0 (fL)   MCH 30.9  26.0 - 34.0 (pg)   MCHC 33.8  30.0 - 36.0 (g/dL)   RDW 09.8 (*) 11.9 - 15.5 (%)   Platelets 235  150 - 400 (K/uL)  GLUCOSE, CAPILLARY     Status: Abnormal   Collection Time   08/01/11  6:13 AM      Component Value Range   Glucose-Capillary 144 (*) 70 - 99 (mg/dL)  GLUCOSE, CAPILLARY     Status: Abnormal   Collection Time   08/01/11  2:53 PM      Component Value Range   Glucose-Capillary 118 (*) 70 - 99 (mg/dL)   Comment 1 Notify RN     Comment 2 Documented in Chart    GLUCOSE, CAPILLARY     Status: Abnormal   Collection Time   08/01/11  3:54 PM      Component Value Range   Glucose-Capillary 246 (*) 70 - 99 (mg/dL)   Comment 1 Notify RN     Comment 2 Documented in Chart      Studies/Results: Dg Chest 1 View  08/01/2011  *RADIOLOGY REPORT*  Clinical  Data:  Cough, diabetes, hypertension, end-stage renal disease on dialysis  CHEST - 1 VIEW  Comparison: 07/14/2011  Findings: Enlargement of cardiac silhouette with pulmonary vascular congestion. Mediastinal contours stable. Peribronchial thickening without infiltrate or effusion. No pneumothorax. No acute osseous findings.  IMPRESSION: Enlargement of cardiac silhouette with pulmonary vascular congestion. Mild bronchitic changes.  Original Report Authenticated By: Lollie Marrow, M.D.   Dg Abd 1 View  08/01/2011  *RADIOLOGY REPORT*  Clinical Data: Lower abdominal pain, nausea, vomiting  ABDOMEN - 1 VIEW  Comparison: None  Findings: Surgical clips  right upper quadrant question cholecystectomy. Staples of right mid abdomen. Nonobstructive bowel gas pattern. No bowel dilatation or bowel wall thickening. Bones unremarkable. No urinary tract calcification. Mild scattered atherosclerotic calcification noted.  IMPRESSION: Nonobstructive bowel gas pattern.  Original Report Authenticated By: Lollie Marrow, M.D.    Medications:    . amLODipine  10 mg Oral QHS   And  . irbesartan  150 mg Oral Daily  . aspirin  81 mg Oral Daily  . calcium acetate  1,334-2,001 mg Oral TID WC  . darbepoetin (ARANESP) injection - DIALYSIS  25 mcg Intravenous Q Tue-HD  .  HYDROmorphone (DILAUDID) injection  1 mg Intramuscular Once  . insulin aspart  0-9 Units Subcutaneous TID WC  . insulin glargine  3 Units Subcutaneous BID  . insulin (NOVOLIN-R) infusion   Intravenous To Minor  . metoCLOPramide (REGLAN) injection  10 mg Intramuscular Once  . metoprolol succinate  100 mg Oral QHS  . mycophenolate  250 mg Oral BID  . paricalcitol  5 mcg Intravenous Q T,Th,Sa-HD  . predniSONE  5 mg Oral Q breakfast  . sodium chloride  1,000 mL Intravenous Once  . sodium chloride  3 mL Intravenous Q12H  . tacrolimus  1 mg Oral BID  . DISCONTD: amLODipine  10 mg Oral Daily  . DISCONTD: amLODipine-valsartan  1 tablet Oral Daily  . DISCONTD: insulin regular  0-10 Units Intravenous Once  . DISCONTD: irbesartan  150 mg Oral Daily    sodium chloride, sodium chloride, acetaminophen, acetaminophen, albuterol, alteplase, calcium carbonate (dosed in mg elemental calcium), camphor-menthol, dextrose, docusate sodium, feeding supplement (NEPRO CARB STEADY), fentaNYL, heparin, heparin, hydrOXYzine, lidocaine, lidocaine-prilocaine, ondansetron (ZOFRAN) IV, ondansetron, pentafluoroprop-tetrafluoroeth, sorbitol, zolpidem, DISCONTD: fentaNYL     . dextrose 5 % and 0.45% NaCl 50 mL/hr at 08/01/11 0142  . DISCONTD: sodium chloride 125 mL/hr at 07/31/11 2343    Assessment/Plan:  Principal  Problem:  *DKA, type 1 Off Insulin drip, however continued to have AG metabolic acidosis, which could be secondary to renal failure. We'll repeat BMP after dialysis to reevaluate her acidosis. Active Problems:  ESRD (end stage renal disease); renal team was consulted for hemodialysis today  Abdominal pain: Also be secondary to DKA, acute abdomen series was unremarkable.  Headache: History of migraine headaches, continue pain control  Hypertension - continue outpatient meds Anemia - Hgb 10.2; ESA as per renal  Metabolic bone disease - continue binders (Phoslo) and Zemplar  S/p failed renal and pancreatic transplant-  on prograf, prednisone, and cellcept         LOS: 1 day   Azie Mcconahy 08/01/2011, 7:22 PM

## 2011-08-01 NOTE — Progress Notes (Signed)
Hemodialysis Note She is on hemodialysis without hemodynamic instability.  Evidence of volume overload with large goal UF planned. Will monitor. Shantanu Strauch C

## 2011-08-01 NOTE — Progress Notes (Signed)
Results for SHENIQUA, CAROLAN (MRN [id number]) as of 08/01/2011 13:19  Ref. Range 08/01/2011 05:36  Sodium Latest Range: 135-145 mEq/L 135  Potassium Latest Range: 3.5-5.1 mEq/L 4.4  Chloride Latest Range: 96-112 mEq/L 97  CO2 Latest Range: 19-32 mEq/L 16 (L)  BUN Latest Range: 6-23 mg/dL 96 (H)  Creat Latest Range: 0.50-1.10 mg/dL 14.78 (H)  Calcium Latest Range: 8.4-10.5 mg/dL 7.9 (L)  GFR calc non Af Amer Latest Range: >90 mL/min 3 (L)  GFR calc Af Amer Latest Range: >90 mL/min 4 (L)  Glucose Latest Range: 70-99 mg/dL 295 (H)    Admitted with elevated CBGs.  Placed on insulin drip on admission.  Got Lantus 3 units at 6am and insulin drip turned off.  Insulin drip was at zero units per hour from 1am to 5am.  Patient currently in dialysis.  CO2 level at 5am on BMET was only 16 mEq/L.  Not sure if it was so low b/c of her renal status or b/c of her elevated sugars.  Was admitted with diagnosis of DKA.  Recommend we check BMET after HD to further assess CO2 level.  Has been placed on 1/2 her home dose of basal insulin.  Will follow. [name] RN, MSN, CDE Diabetes Coordinator Inpatient Diabetes Program [phone]

## 2011-08-01 NOTE — Progress Notes (Signed)
Utilization Review Completed.  Eytan Carrigan T  08/01/2011  

## 2011-08-01 NOTE — ED Notes (Signed)
Report called [id number].

## 2011-08-02 ENCOUNTER — Inpatient Hospital Stay (HOSPITAL_COMMUNITY): Payer: Medicare Other

## 2011-08-02 LAB — GLUCOSE, CAPILLARY

## 2011-08-02 LAB — CBC
HCT: 32.6 % — ABNORMAL LOW (ref 36.0–46.0)
Hemoglobin: 10.9 g/dL — ABNORMAL LOW (ref 12.0–15.0)
RBC: 3.55 MIL/uL — ABNORMAL LOW (ref 3.87–5.11)

## 2011-08-02 LAB — RENAL FUNCTION PANEL
Albumin: 3.1 g/dL — ABNORMAL LOW (ref 3.5–5.2)
Calcium: 8.9 mg/dL (ref 8.4–10.5)
Chloride: 93 mEq/L — ABNORMAL LOW (ref 96–112)
Creatinine, Ser: 7.36 mg/dL — ABNORMAL HIGH (ref 0.50–1.10)
GFR calc non Af Amer: 6 mL/min — ABNORMAL LOW (ref >90)

## 2011-08-02 LAB — BASIC METABOLIC PANEL
CO2: 21 mEq/L (ref 19–32)
Chloride: 84 mEq/L — ABNORMAL LOW (ref 96–112)
Potassium: 5.5 mEq/L — ABNORMAL HIGH (ref 3.5–5.1)
Sodium: 121 mEq/L — ABNORMAL LOW (ref 135–145)

## 2011-08-02 MED ORDER — INSULIN ASPART 100 UNIT/ML ~~LOC~~ SOLN
0.0000 [IU] | Freq: Three times a day (TID) | SUBCUTANEOUS | Status: DC
  Administered 2011-08-02: 5 [IU] via SUBCUTANEOUS
  Administered 2011-08-02: 15 [IU] via SUBCUTANEOUS
  Administered 2011-08-03: 3 [IU] via SUBCUTANEOUS

## 2011-08-02 MED ORDER — ONDANSETRON HCL 4 MG/2ML IJ SOLN
INTRAMUSCULAR | Status: AC
  Administered 2011-08-02: 4 mg
  Filled 2011-08-02: qty 2

## 2011-08-02 MED ORDER — INSULIN ASPART 100 UNIT/ML ~~LOC~~ SOLN
30.0000 [IU] | Freq: Once | SUBCUTANEOUS | Status: AC
  Administered 2011-08-02: 30 [IU] via SUBCUTANEOUS

## 2011-08-02 MED ORDER — CLONAZEPAM 0.5 MG PO TABS
0.5000 mg | ORAL_TABLET | Freq: Once | ORAL | Status: AC
  Administered 2011-08-02: 0.5 mg via ORAL
  Filled 2011-08-02: qty 1

## 2011-08-02 MED ORDER — INSULIN ASPART 100 UNIT/ML ~~LOC~~ SOLN
3.0000 [IU] | Freq: Three times a day (TID) | SUBCUTANEOUS | Status: DC
  Administered 2011-08-03: 3 [IU] via SUBCUTANEOUS
  Filled 2011-08-02: qty 3

## 2011-08-02 MED ORDER — ONDANSETRON HCL 4 MG/2ML IJ SOLN
INTRAMUSCULAR | Status: AC
  Filled 2011-08-02: qty 2

## 2011-08-02 MED ORDER — INSULIN GLARGINE 100 UNIT/ML ~~LOC~~ SOLN
5.0000 [IU] | Freq: Two times a day (BID) | SUBCUTANEOUS | Status: DC
  Administered 2011-08-02 (×2): 5 [IU] via SUBCUTANEOUS

## 2011-08-02 MED ORDER — PARICALCITOL 5 MCG/ML IV SOLN
INTRAVENOUS | Status: AC
  Administered 2011-08-02: 5 ug via INTRAVENOUS
  Filled 2011-08-02: qty 1

## 2011-08-02 MED ORDER — INSULIN ASPART 100 UNIT/ML ~~LOC~~ SOLN
0.0000 [IU] | SUBCUTANEOUS | Status: DC
Start: 2011-08-02 — End: 2011-08-02

## 2011-08-02 NOTE — Progress Notes (Signed)
Chaplain visited with patient and her aunt, [name]. Patient is still grieving the death of her mother (Gwyn's sister) from last year. Chaplain listened and provided emotional support. Chaplain experienced both the patient and her aunt as coping okay. Chaplain closed the visit with prayer. No follow up needed.

## 2011-08-02 NOTE — Progress Notes (Signed)
Inpatient Diabetes Program Recommendations  AACE/ADA: New Consensus Statement on Inpatient Glycemic Control (2009)  Target Ranges:  Prepandial:   less than 140 mg/dL      Peak postprandial:   less than 180 mg/dL (1-2 hours)      Critically ill patients:  140 - 180 mg/dL    Inpatient Diabetes Program Recommendations Insulin - Meal Coverage: Please add Novolog 3 units with meals

## 2011-08-02 NOTE — Progress Notes (Signed)
08/01/01 4098 Patient had lost IV access and IV team was called to restart. IV team could not restart. Patient said she has received PICC lines or Central lines in the past. Patient initially received gluco-stablilizer orders but it could not be initiated because of lack of IV access and on- call hospitalist was called and notified of failed restart attempt. Called nephrologist on call however patient could not receive orders for PICC line or central line. On call hospitalist was called and notified of nephrologist's decision. Order were then given for SSI novolog sbq and CBG to be done q2hrs. Until CBG decreased and were stable. Last CBG taken this am was 72. It was reported to oncoming nurse and on call MD was notified at 0730. Reported oncoming nurse to inquire if patient can receive an order for  a PICC line or Central Line for IV access from patient's nephrologist.

## 2011-08-02 NOTE — Progress Notes (Signed)
CRITICAL VALUE ALERT  Critical value received:  854  Date of notification: 08/01/11  Time of notification:  2244  Critical value read back:yes  Nurse who received alert:  Leonia Reeves RN  MD notified (1st page):  Craige Cotta, NP  Time of first page:  2248  MD notified (2nd page):  Time of second page:  Responding MD:  Craige Cotta, NP  Time MD responded:  [phone]

## 2011-08-02 NOTE — Progress Notes (Signed)
Hemodialysis Note: Treatment in progress.  No instability.  Blood sugar very labile.  Nausea and vomiting improved. AV access functioning OK.  Blood pressure stable.  Recent hyponatremia pseudo due to elevated blood sugar. Yeilin Zweber C

## 2011-08-02 NOTE — Progress Notes (Signed)
Chaplain attempted another visit to patient in response to a spiritual care consult order. Patient was not available. Chaplain will attempt another visit at a later time.

## 2011-08-02 NOTE — Progress Notes (Addendum)
CRITICAL VALUE ALERT  Critical value received:  900  Date of notification:  08/01/11  Time of notification:  2426  Critical value read back:yes  Nurse who received alert:  Leonia Reeves RN  MD notified (1st page):  Craige Cotta, NP  Time of first page:  2445  MD notified (2nd page):  Time of second page:  Responding MD:  Craige Cotta, NP  Time MD responded:  2450  Charge RN received critical value when I was at lunch. I was then notified and MD on call was called.

## 2011-08-02 NOTE — Progress Notes (Signed)
Subjective: This is seen and examined , denies any nausea or vomiting and overall feeling better.  Objective: Vital signs in last 24 hours: Temp:  [96.9 F (36.1 C)-98.7 F (37.1 C)] 98.7 F (37.1 C) (03/07 1300) Pulse Rate:  [68-107] 87  (03/07 1300) Resp:  [13-20] 20  (03/07 1300) BP: (105-158)/(53-93) 149/79 mmHg (03/07 1300) SpO2:  [100 %] 100 % (03/07 1300) Weight:  [71.1 kg (156 lb 12 oz)-74.1 kg (163 lb 5.8 oz)] 71.3 kg (157 lb 3 oz) (03/07 1208) Weight change: -0.1 kg (-3.5 oz) Last BM Date: 08/02/11  Intake/Output from previous day: 03/06 0701 - 03/07 0700 In: 1448.3 [P.O.:880; I.V.:568.3] Out: 4948  Total I/O In: 240 [P.O.:240] Out: 2106 [Other:2106]   Physical Exam:  General: Alert, awake, oriented x3, in no acute distress.Marland Kitchen  Heart: Regular rate and rhythm, without murmurs, rubs, gallops.  Lungs: Clear to auscultation bilaterally.  Abdomen: Soft, mild lower abdomen tenders with no rebound or guarding, nondistended, positive bowel sounds.  Extremities: No clubbing cyanosis or edema with positive pedal pulses.  Neuro: Grossly intact, nonfocal.       Lab Results: Results for orders placed during the hospital encounter of 07/31/11 (from the past 24 hour(s))  GLUCOSE, CAPILLARY     Status: Abnormal   Collection Time   08/01/11  2:53 PM      Component Value Range   Glucose-Capillary 118 (*) 70 - 99 (mg/dL)   Comment 1 Notify RN     Comment 2 Documented in Chart    GLUCOSE, CAPILLARY     Status: Abnormal   Collection Time   08/01/11  3:54 PM      Component Value Range   Glucose-Capillary 246 (*) 70 - 99 (mg/dL)   Comment 1 Notify RN     Comment 2 Documented in Chart    BASIC METABOLIC PANEL     Status: Abnormal   Collection Time   08/01/11  9:19 PM      Component Value Range   Sodium 123 (*) 135 - 145 (mEq/L)   Potassium 5.3 (*) 3.5 - 5.1 (mEq/L)   Chloride 87 (*) 96 - 112 (mEq/L)   CO2 20  19 - 32 (mEq/L)   Glucose, Bld 845 (*) 70 - 99 (mg/dL)   BUN 33  (*) 6 - 23 (mg/dL)   Creatinine, Ser 1.61 (*) 0.50 - 1.10 (mg/dL)   Calcium 8.5  8.4 - 09.6 (mg/dL)   GFR calc non Af Amer 8 (*) >90 (mL/min)   GFR calc Af Amer 9 (*) >90 (mL/min)  GLUCOSE, CAPILLARY     Status: Abnormal   Collection Time   08/01/11  9:56 PM      Component Value Range   Glucose-Capillary >600 (*) 70 - 99 (mg/dL)  BASIC METABOLIC PANEL     Status: Abnormal   Collection Time   08/01/11 11:00 PM      Component Value Range   Sodium 121 (*) 135 - 145 (mEq/L)   Potassium 5.5 (*) 3.5 - 5.1 (mEq/L)   Chloride 84 (*) 96 - 112 (mEq/L)   CO2 21  19 - 32 (mEq/L)   Glucose, Bld 900 (*) 70 - 99 (mg/dL)   BUN 36 (*) 6 - 23 (mg/dL)   Creatinine, Ser 0.45 (*) 0.50 - 1.10 (mg/dL)   Calcium 8.8  8.4 - 40.9 (mg/dL)   GFR calc non Af Amer 8 (*) >90 (mL/min)   GFR calc Af Amer 9 (*) >90 (mL/min)  GLUCOSE, CAPILLARY  Status: Abnormal   Collection Time   08/02/11 12:59 AM      Component Value Range   Glucose-Capillary >600 (*) 70 - 99 (mg/dL)   Comment 1 Notify RN     Comment 2 Documented in Chart    GLUCOSE, CAPILLARY     Status: Abnormal   Collection Time   08/02/11  2:36 AM      Component Value Range   Glucose-Capillary 155 (*) 70 - 99 (mg/dL)   Comment 1 Documented in Chart     Comment 2 Notify RN    GLUCOSE, CAPILLARY     Status: Abnormal   Collection Time   08/02/11  3:10 AM      Component Value Range   Glucose-Capillary 59 (*) 70 - 99 (mg/dL)  GLUCOSE, CAPILLARY     Status: Normal   Collection Time   08/02/11  3:57 AM      Component Value Range   Glucose-Capillary 74  70 - 99 (mg/dL)  GLUCOSE, CAPILLARY     Status: Abnormal   Collection Time   08/02/11  5:16 AM      Component Value Range   Glucose-Capillary 61 (*) 70 - 99 (mg/dL)   Comment 1 Documented in Chart     Comment 2 Notify RN    GLUCOSE, CAPILLARY     Status: Normal   Collection Time   08/02/11  6:52 AM      Component Value Range   Glucose-Capillary 72  70 - 99 (mg/dL)   Comment 1 Documented in Chart      Comment 2 Notify RN    RENAL FUNCTION PANEL     Status: Abnormal   Collection Time   08/02/11  9:56 AM      Component Value Range   Sodium 132 (*) 135 - 145 (mEq/L)   Potassium 4.1  3.5 - 5.1 (mEq/L)   Chloride 93 (*) 96 - 112 (mEq/L)   CO2 22  19 - 32 (mEq/L)   Glucose, Bld 105 (*) 70 - 99 (mg/dL)   BUN 43 (*) 6 - 23 (mg/dL)   Creatinine, Ser 4.09 (*) 0.50 - 1.10 (mg/dL)   Calcium 8.9  8.4 - 81.1 (mg/dL)   Phosphorus 4.4  2.3 - 4.6 (mg/dL)   Albumin 3.1 (*) 3.5 - 5.2 (g/dL)   GFR calc non Af Amer 6 (*) >90 (mL/min)   GFR calc Af Amer 7 (*) >90 (mL/min)  CBC     Status: Abnormal   Collection Time   08/02/11  9:57 AM      Component Value Range   WBC 7.0  4.0 - 10.5 (K/uL)   RBC 3.55 (*) 3.87 - 5.11 (MIL/uL)   Hemoglobin 10.9 (*) 12.0 - 15.0 (g/dL)   HCT 91.4 (*) 78.2 - 46.0 (%)   MCV 91.8  78.0 - 100.0 (fL)   MCH 30.7  26.0 - 34.0 (pg)   MCHC 33.4  30.0 - 36.0 (g/dL)   RDW 95.6  21.3 - 08.6 (%)   Platelets 221  150 - 400 (K/uL)  GLUCOSE, CAPILLARY     Status: Abnormal   Collection Time   08/02/11 11:34 AM      Component Value Range   Glucose-Capillary 280 (*) 70 - 99 (mg/dL)   Comment 1 Documented in Chart      Studies/Results: Dg Chest 1 View  08/01/2011  *RADIOLOGY REPORT*  Clinical Data:  Cough, diabetes, hypertension, end-stage renal disease on dialysis  CHEST - 1 VIEW  Comparison: 07/14/2011  Findings: Enlargement of cardiac silhouette with pulmonary vascular congestion. Mediastinal contours stable. Peribronchial thickening without infiltrate or effusion. No pneumothorax. No acute osseous findings.  IMPRESSION: Enlargement of cardiac silhouette with pulmonary vascular congestion. Mild bronchitic changes.  Original Report Authenticated By: Lollie Marrow, M.D.   Dg Abd 1 View  08/01/2011  *RADIOLOGY REPORT*  Clinical Data: Lower abdominal pain, nausea, vomiting  ABDOMEN - 1 VIEW  Comparison: None  Findings: Surgical clips right upper quadrant question cholecystectomy. Staples of  right mid abdomen. Nonobstructive bowel gas pattern. No bowel dilatation or bowel wall thickening. Bones unremarkable. No urinary tract calcification. Mild scattered atherosclerotic calcification noted.  IMPRESSION: Nonobstructive bowel gas pattern.  Original Report Authenticated By: Lollie Marrow, M.D.    Medications:    . amLODipine  10 mg Oral QHS   And  . irbesartan  150 mg Oral Daily  . aspirin  81 mg Oral Daily  . calcium acetate  1,334-2,001 mg Oral TID WC  . clonazePAM  0.5 mg Oral Once  . darbepoetin (ARANESP) injection - DIALYSIS  25 mcg Intravenous Q Tue-HD  . insulin aspart  0-15 Units Subcutaneous TID WC  . insulin aspart  20 Units Subcutaneous NOW  . insulin aspart  30 Units Subcutaneous Once  . insulin glargine  5 Units Subcutaneous BID  . metoprolol succinate  100 mg Oral QHS  . mycophenolate  250 mg Oral BID  . ondansetron      . ondansetron      . paricalcitol  5 mcg Intravenous Q T,Th,Sa-HD  . predniSONE  5 mg Oral Q breakfast  . sodium chloride  3 mL Intravenous Q12H  . tacrolimus  1 mg Oral BID  . DISCONTD: amLODipine  10 mg Oral Daily  . DISCONTD: insulin aspart  0-15 Units Subcutaneous Q1H  . DISCONTD: insulin aspart  0-15 Units Subcutaneous Q2H  . DISCONTD: insulin aspart  0-9 Units Subcutaneous TID WC  . DISCONTD: insulin glargine  3 Units Subcutaneous BID  . DISCONTD: irbesartan  150 mg Oral Daily    sodium chloride, sodium chloride, acetaminophen, acetaminophen, albuterol, alteplase, calcium carbonate (dosed in mg elemental calcium), camphor-menthol, dextrose, docusate sodium, feeding supplement (NEPRO CARB STEADY), fentaNYL, glucagon, glucagon, heparin, heparin, HYDROcodone-acetaminophen, hydrOXYzine, lidocaine, lidocaine-prilocaine, ondansetron (ZOFRAN) IV, ondansetron, pentafluoroprop-tetrafluoroeth, sorbitol, zolpidem, DISCONTD: dextrose     . DISCONTD: sodium chloride    . DISCONTD: dextrose 5 % and 0.45% NaCl 50 mL/hr at 08/01/11 0142  .  DISCONTD: insulin (NOVOLIN-R) infusion      Assessment/Plan: Principal Problem:  *DKA, type 1  DKA resolved however blood glucose remained labile Continue with Lantus insulin 5 units twice a day and would add NovoLog coverage 3 units with meals. Continue to monitor CBGs  every 4 hours and adjust insulin regimen. Active Problems:  ESRD (end stage renal disease); status post hemodialysis today  Abdominal pain: Resolved , was possibly secondary to DKA, acute abdomen series was unremarkable.  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     LOS: 2 days   Jackye Dever 08/02/2011, 1:32 PM

## 2011-08-03 LAB — GLUCOSE, CAPILLARY
Glucose-Capillary: 237 mg/dL — ABNORMAL HIGH (ref 70–99)
Glucose-Capillary: 374 mg/dL — ABNORMAL HIGH (ref 70–99)
Glucose-Capillary: 82 mg/dL (ref 70–99)

## 2011-08-03 MED ORDER — PROMETHAZINE HCL 25 MG PO TABS: 25.0000 mg | ORAL_TABLET | Freq: Three times a day (TID) | ORAL | Status: DC | PRN

## 2011-08-03 MED ORDER — BUTALBITAL-APAP-CAFFEINE 50-325-40 MG PO TABS: 1.0000 | ORAL_TABLET | Freq: Four times a day (QID) | ORAL | Status: DC | PRN

## 2011-08-03 MED ORDER — BUTALBITAL-APAP-CAFFEINE 50-325-40 MG PO TABS: 1.0000 | ORAL_TABLET | Freq: Three times a day (TID) | ORAL | Status: AC | PRN

## 2011-08-03 NOTE — Progress Notes (Signed)
Assessment/Plan:  1. Nausea, Vomiting, headache, abdominal pain- resolved  2. ESRD- TTS [facility]; next treatment Saturday 3 Hypertension/volume -  4. S/p failed renal and pancreatic transplant- remains on prograf, prednisone, cellcept and insulin 5. Disposition- No renal problems with discharge.      We will need to make decisions about ongoing immunosuppression as  Outpatient given her insulin requirements.  I think they can be weaned but we can confer with [facility] transplant clinic first  . S:I'm ready to go home O:BP 171/80  Pulse 74  Temp(Src) 98.3 F (36.8 [name]) (Oral)  Resp 18  Ht 5\' 1"  (1.549 m)  Wt 72.6 kg (160 lb 0.9 oz)  BMI 30.24 kg/m2  SpO2 100%  LMP 07/13/2011  Intake/Output Summary (Last 24 hours) at 08/03/11 0900 Last data filed at 08/03/11 0855  Gross per 24 hour  Intake    860 ml  Output   2106 ml  Net  -1246 ml   Weight change: -2.2 kg (-4 lb 13.6 oz) ZOX:WRUE appearing CVS:RRR Resp:clear AVW:UJWJ Ext:no significant edema    . amLODipine  10 mg Oral QHS   And  . irbesartan  150 mg Oral Daily  . aspirin  81 mg Oral Daily  . calcium acetate  1,334-2,001 mg Oral TID WC  . darbepoetin (ARANESP) injection - DIALYSIS  25 mcg Intravenous Q Tue-HD  . insulin aspart  0-15 Units Subcutaneous TID WC  . insulin aspart  3 Units Subcutaneous TID WC  . insulin glargine  5 Units Subcutaneous BID  . metoprolol succinate  100 mg Oral QHS  . mycophenolate  250 mg Oral BID  . ondansetron      . ondansetron      . paricalcitol  5 mcg Intravenous Q T,Th,Sa-HD  . predniSONE  5 mg Oral Q breakfast  . sodium chloride  3 mL Intravenous Q12H  . tacrolimus  1 mg Oral BID   No results found. BMET  Lab 08/02/11 0956 08/01/11 2300 08/01/11 2119 08/01/11 0536 07/31/11 2124  NA 132* 121* 123* 135 128*  K 4.1 5.5* 5.3* 4.4 4.3  CL 93* 84* 87* 97 90*  CO2 22 21 20  16* 15*  GLUCOSE 105* 900* 845* 145* 464*  BUN 43* 36* 33* 96* 95*  CREATININE 7.36* 6.30* 6.15* 13.11* 12.65*  ALB --  -- -- -- --  CALCIUM 8.9 8.8 8.5 7.9* 8.7  PHOS 4.4 -- -- -- --   CBC  Lab 08/02/11 0957 08/01/11 0536 07/31/11 2254  WBC 7.0 10.5 9.7  NEUTROABS -- -- --  HGB 10.9* 10.2* 11.0*  HCT 32.6* 30.2* 32.1*  MCV 91.8 91.5 91.5  PLT 221 235 263    [name]

## 2011-08-03 NOTE — Progress Notes (Signed)
DC Tele, DC home, No IV. Discharge patient after rechecking CBG which was 183. Discharge instructions and home medications discussed with patient. Patient denies any questions or concerns at this time. Patient leaving unit ambulatory and appears in no acute distress.

## 2011-08-03 NOTE — Discharge Summary (Signed)
Physician Discharge Summary  Patient ID: [name] MRN: [id number] DOB/AGE: March 31, 1971 41 y.o.  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 anemia of chronic disease s/p failed renal and pancreatic transplant: On Prograf/prednisone and CellCept  Consults:  Renal service   Discharge Medications: Medication List  As of 08/03/2011  9:27 AM   TAKE these medications         albuterol 108 (90 BASE) MCG/ACT inhaler   Commonly known as: PROVENTIL HFA;VENTOLIN HFA   Inhale 2 puffs into the lungs every 4 (four) hours as needed. For wheezing.      amLODipine-valsartan 10-160 MG per tablet   Commonly known as: EXFORGE   Take 1 tablet by mouth daily.      aspirin 81 MG tablet   Take 81 mg by mouth daily.      butalbital-acetaminophen-caffeine 50-325-40 MG per tablet   Commonly known as: FIORICET, ESGIC   Take 1 tablet by mouth every 8 (eight) hours as needed.      calcium acetate 667 MG capsule   Commonly known as: PHOSLO   Take 1,334-2,001 mg by mouth 3 (three) times daily with meals. Dosage depends on meal consumed      CELLCEPT 250 MG capsule   Generic drug: mycophenolate   Take 250 mg by mouth 2 (two) times daily.      clonazePAM 0.5 MG tablet   Commonly known as: KLONOPIN   Take 2 mg by mouth at bedtime. For anxiety      cyclobenzaprine 5 MG tablet   Commonly known as: FLEXERIL   Take 1 tablet by mouth Three times daily as needed. For muscle spasms.      LANTUS SOLOSTAR 100 UNIT/ML injection   Generic drug: insulin glargine   Inject 7 Units into the skin 2 (two) times daily.      metoprolol succinate 100 MG 24 hr tablet   Commonly known as: TOPROL-XL   Take 100 mg by mouth at bedtime. Do  not take the night before dialysis. Dialysis on Tues, Thurs & Sat.      NOVOLOG FLEXPEN 100 UNIT/ML injection   Generic drug: insulin aspart   Inject 3 Units into the skin 3 (three) times daily as needed. As directed. Per sliding scale.   >170= 2 units (on top of the three)  >200= 3 units (on top of the three)  >300= 4 units (on top of the three)  >400= 5 units (on top of the three)  >500= call MD  Only uses if she eats.      pantoprazole 40 MG tablet   Commonly known as: PROTONIX   Take 1 tablet by mouth Every morning.      predniSONE 5 MG tablet   Commonly known as: DELTASONE   Take 1 tablet by mouth daily.      PROGRAF 1 MG capsule   Generic drug: tacrolimus   Take 1 mg by mouth 2 (two) times daily.      promethazine 25 MG tablet   Commonly known as: PHENERGAN   Take 1 tablet (25 mg total) by mouth every 8 (eight) hours as needed. For nausea.      zolpidem 10 MG tablet   Commonly known as:  AMBIEN   Take 1 tablet by mouth at bedtime. For sleep.             Brief H and P: For complete details please refer to admission H and P, but in brief 41 year old female with history of end-stage renal disease status post failed pancreatic/renal transplant on HD, type I diabetic with a history of DKA presented with several weeks of poor glucose control and low midline abdominal pain and several days of subjective fevers and chills with headache, anorexia and decreased by mouth intake. Patient was found to have elevated blood sugars and metabolic acidosis. Patient was admitted for DKA  Hospital Course:  Patient was admitted with diabetic ketoacidosis, was started on insulin drip per protocol. DKA resolved however blood glucose remained labile, patient appears to have brittle diabetes. She was started on Lantus and on meal coverage per diabetic coordinator recommendations. Patient did have low glucose in the 40s and meal coverage was discontinued. Patient should continue Lantus and  sliding scale insulin per her home schedule and follow up with Dr. Sharl Ma (her endocrinologist) with the next 10 days.   ESRD (end stage renal disease); renal service was consulted, patient underwent hemodialysis per her outpatient schedule  Abdominal pain: Resolved , was possibly secondary to DKA, acute abdomen series was unremarkable.  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 HEENT: anicteric sclera, pupils reactive to light and accommodation CVS: S1-S2 clear no murmur rubs or gallops Chest: clear to auscultation bilaterally, no wheezing rales or rhonchi Abdomen: soft nontender, nondistended, normal bowel sounds, no organomegaly Extremities: no cyanosis, clubbing or edema noted bilaterally Neuro: Cranial nerves II-XII intact, no focal neurological deficits   The results of significant diagnostics from this hospitalization (including imaging, microbiology, ancillary and laboratory) are listed below for reference.    LAB RESULTS: Basic Metabolic Panel:  Lab 08/02/11 0960 08/01/11 2300  NA 132* 121*  K 4.1 5.5*  CL 93* 84*  CO2 22 21  GLUCOSE 105* 900*  BUN 43* 36*  CREATININE 7.36* 6.30*  CALCIUM 8.9 8.8  MG -- --  PHOS 4.4 --   Liver Function Tests:  Lab 08/02/11 0956  AST --  ALT --  ALKPHOS --  BILITOT --  PROT --  ALBUMIN 3.1*   CBC:  Lab 08/02/11 0957 08/01/11 0536  WBC 7.0 10.5  NEUTROABS -- --  HGB 10.9* 10.2*    HCT 32.6* 30.2*  MCV 91.8 --  PLT 221 235   Cardiac Enzymes: No results found for this basename: CKTOTAL:2,CKMB:2,CKMBINDEX:2,TROPONINI:2 in the last 168 hours BNP: No components found with this basename: POCBNP:2 CBG:  Lab 08/03/11 0749 08/03/11 0403  GLUCAP 169* 237*    Significant Diagnostic Studies:  Dg Chest 1 View  08/01/2011  *RADIOLOGY REPORT*  Clinical Data:  Cough, diabetes, hypertension, end-stage renal disease on dialysis  CHEST - 1 VIEW  Comparison: 07/14/2011  Findings: Enlargement of cardiac  silhouette with pulmonary vascular congestion. Mediastinal contours stable. Peribronchial thickening without infiltrate or effusion. No pneumothorax. No acute osseous findings.  IMPRESSION: Enlargement of cardiac silhouette with pulmonary vascular congestion. Mild bronchitic changes.  Original Report Authenticated By: Lollie Marrow, M.D.   Dg Abd 1 View  08/01/2011  *RADIOLOGY REPORT*  Clinical Data: Lower abdominal pain, nausea, vomiting  ABDOMEN - 1 VIEW  Comparison: None  Findings: Surgical clips right upper quadrant question cholecystectomy. Staples of right mid abdomen. Nonobstructive bowel gas pattern. No bowel dilatation or bowel wall thickening. Bones unremarkable. No urinary tract calcification. Mild scattered atherosclerotic calcification noted.  IMPRESSION: Nonobstructive bowel gas pattern.  Original Report Authenticated By: Lollie Marrow, M.D.     Disposition and Follow-up: Discharge Orders    Future Appointments: Provider: Department: Dept Phone: Center:   08/15/2011 3:00 PM Oliver Barre, MD [facility] [phone] [facility]     Future Orders Please Complete By Expires   Diet Carb Modified      Increase activity slowly          DISPOSITION: Home  DIET: Carb modified diet  ACTIVITY: As tolerated  DISCHARGE FOLLOW-UP Follow-up Information    Follow up with KERR,JEFFREY, MD. Schedule an appointment as soon as possible for a visit in 2 weeks. (for hospital follow-up and  management of diabetes)          Time spent on Discharge: 35 minutes  Signed:  Shweta Aman M.D. [facility] 08/03/2011, 9:27 AM

## 2011-08-03 NOTE — Progress Notes (Addendum)
CRITICAL VALUE ALERT  Critical value received: CBG=42  Date of notification: 08-03-11  Time of notification:  1140am  Critical value read back: yes  Nurse who received alert:  Junie Panning  MD notified (1st page):  R. Rai  Time of first page:  1142am  MD notified (2nd page):  Time of second page:  Responding MD: Rod Can  Time MD responded:  1145am

## 2011-08-04 IMAGING — CR DG CHEST 2V
2 series · 2 of 2 positions shown · non-contrast
Comparison: 05/05/2009

CLINICAL DATA: Cough.  Congestion.  Wheezing.  Hemoptysis.    Renal
and pancreatic transplant.  Question pneumonia.

CHEST - 2 VIEW

[view not recorded (1 of 2)]
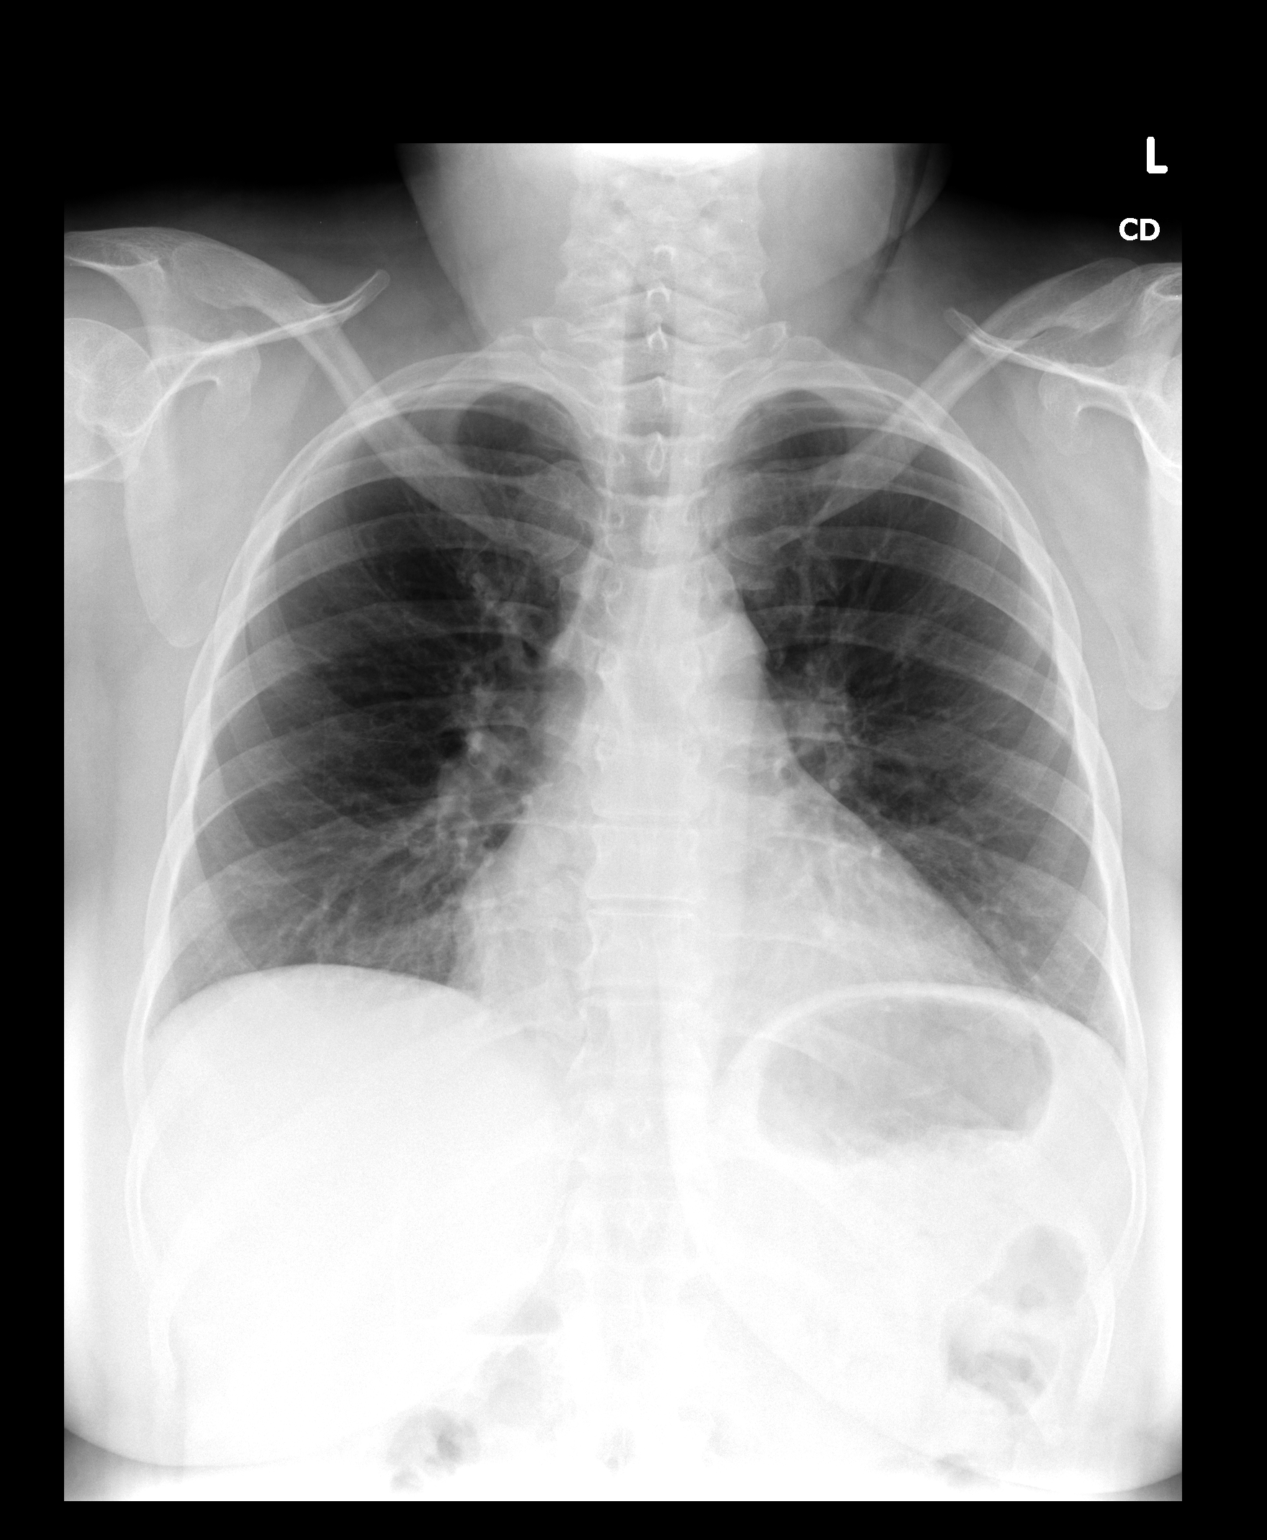

[view not recorded (2 of 2)]
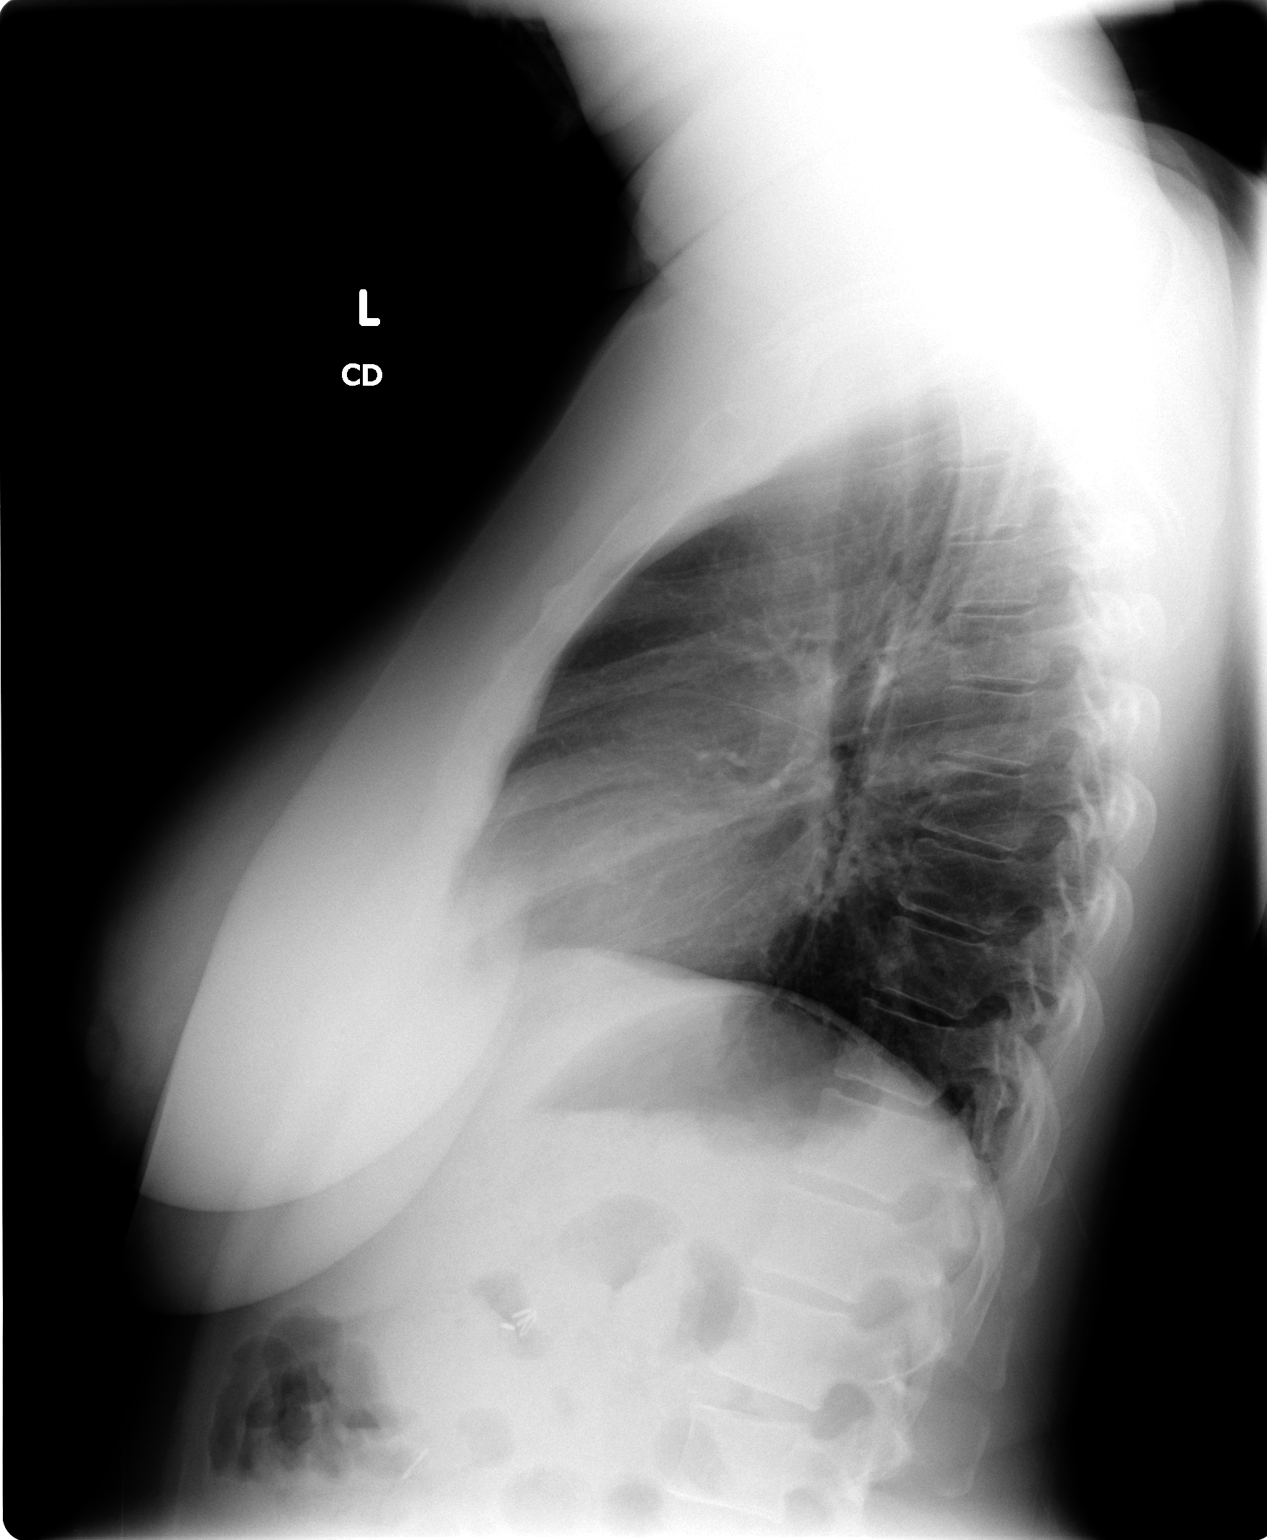

[2 of 2 positions shown; findings below may reference images not displayed]

FINDINGS: Midline trachea.  Normal heart size and mediastinal
contours. To No pleural effusion or pneumothorax.  Lung volumes are
mildly low.  Minimal bibasilar atelectasis.
IMPRESSION: 1.  No acute process or evidence of pneumonia.
2.  Mildly low lung volumes with minimal bibasilar atelectasis.

## 2011-08-08 LAB — CULTURE, BLOOD (SINGLE)

## 2011-08-11 ENCOUNTER — Encounter: Payer: Self-pay | Admitting: Internal Medicine

## 2011-08-11 DIAGNOSIS — K219 Gastro-esophageal reflux disease without esophagitis: Secondary | ICD-10-CM | POA: Insufficient documentation

## 2011-08-11 DIAGNOSIS — E11319 Type 2 diabetes mellitus with unspecified diabetic retinopathy without macular edema: Secondary | ICD-10-CM | POA: Insufficient documentation

## 2011-08-11 DIAGNOSIS — Z0000 Encounter for general adult medical examination without abnormal findings: Secondary | ICD-10-CM | POA: Insufficient documentation

## 2011-08-11 DIAGNOSIS — Z992 Dependence on renal dialysis: Secondary | ICD-10-CM | POA: Insufficient documentation

## 2011-08-11 DIAGNOSIS — E611 Iron deficiency: Secondary | ICD-10-CM | POA: Insufficient documentation

## 2011-08-11 DIAGNOSIS — E785 Hyperlipidemia, unspecified: Secondary | ICD-10-CM | POA: Insufficient documentation

## 2011-08-11 DIAGNOSIS — N189 Chronic kidney disease, unspecified: Secondary | ICD-10-CM | POA: Insufficient documentation

## 2011-08-11 DIAGNOSIS — D631 Anemia in chronic kidney disease: Secondary | ICD-10-CM

## 2011-08-11 DIAGNOSIS — F329 Major depressive disorder, single episode, unspecified: Secondary | ICD-10-CM | POA: Insufficient documentation

## 2011-08-11 DIAGNOSIS — E669 Obesity, unspecified: Secondary | ICD-10-CM | POA: Insufficient documentation

## 2011-08-11 DIAGNOSIS — N2581 Secondary hyperparathyroidism of renal origin: Secondary | ICD-10-CM | POA: Insufficient documentation

## 2011-08-11 DIAGNOSIS — G2581 Restless legs syndrome: Secondary | ICD-10-CM | POA: Insufficient documentation

## 2011-08-11 DIAGNOSIS — L659 Nonscarring hair loss, unspecified: Secondary | ICD-10-CM | POA: Insufficient documentation

## 2011-08-11 DIAGNOSIS — G43909 Migraine, unspecified, not intractable, without status migrainosus: Secondary | ICD-10-CM | POA: Insufficient documentation

## 2011-08-11 DIAGNOSIS — F32A Depression, unspecified: Secondary | ICD-10-CM | POA: Insufficient documentation

## 2011-08-11 DIAGNOSIS — E1029 Type 1 diabetes mellitus with other diabetic kidney complication: Secondary | ICD-10-CM | POA: Insufficient documentation

## 2011-08-15 ENCOUNTER — Ambulatory Visit: Payer: Medicare Other | Admitting: Internal Medicine

## 2011-08-15 DIAGNOSIS — Z0289 Encounter for other administrative examinations: Secondary | ICD-10-CM

## 2011-08-28 ENCOUNTER — Emergency Department (HOSPITAL_COMMUNITY): Payer: Medicare Other

## 2011-08-28 ENCOUNTER — Encounter (HOSPITAL_COMMUNITY): Payer: Self-pay

## 2011-08-28 ENCOUNTER — Emergency Department (HOSPITAL_COMMUNITY)
Admission: EM | Admit: 2011-08-28 | Discharge: 2011-08-29 | Disposition: A | Discharge status: Home / Self Care | Payer: Medicare Other | Attending: Emergency Medicine | Admitting: Emergency Medicine

## 2011-08-28 DIAGNOSIS — N186 End stage renal disease: Secondary | ICD-10-CM | POA: Insufficient documentation

## 2011-08-28 DIAGNOSIS — R51 Headache: Secondary | ICD-10-CM | POA: Insufficient documentation

## 2011-08-28 DIAGNOSIS — E109 Type 1 diabetes mellitus without complications: Secondary | ICD-10-CM | POA: Insufficient documentation

## 2011-08-28 DIAGNOSIS — E785 Hyperlipidemia, unspecified: Secondary | ICD-10-CM | POA: Insufficient documentation

## 2011-08-28 DIAGNOSIS — F329 Major depressive disorder, single episode, unspecified: Secondary | ICD-10-CM | POA: Insufficient documentation

## 2011-08-28 DIAGNOSIS — R569 Unspecified convulsions: Secondary | ICD-10-CM | POA: Insufficient documentation

## 2011-08-28 DIAGNOSIS — Z794 Long term (current) use of insulin: Secondary | ICD-10-CM | POA: Insufficient documentation

## 2011-08-28 DIAGNOSIS — Z79899 Other long term (current) drug therapy: Secondary | ICD-10-CM | POA: Insufficient documentation

## 2011-08-28 DIAGNOSIS — Z992 Dependence on renal dialysis: Secondary | ICD-10-CM | POA: Insufficient documentation

## 2011-08-28 DIAGNOSIS — Z7982 Long term (current) use of aspirin: Secondary | ICD-10-CM | POA: Insufficient documentation

## 2011-08-28 DIAGNOSIS — F3289 Other specified depressive episodes: Secondary | ICD-10-CM | POA: Insufficient documentation

## 2011-08-28 DIAGNOSIS — K219 Gastro-esophageal reflux disease without esophagitis: Secondary | ICD-10-CM | POA: Insufficient documentation

## 2011-08-28 DIAGNOSIS — I120 Hypertensive chronic kidney disease with stage 5 chronic kidney disease or end stage renal disease: Secondary | ICD-10-CM | POA: Insufficient documentation

## 2011-08-28 LAB — COMPREHENSIVE METABOLIC PANEL
Albumin: 3.5 g/dL (ref 3.5–5.2)
Alkaline Phosphatase: 105 U/L (ref 39–117)
BUN: 37 mg/dL — ABNORMAL HIGH (ref 6–23)
Chloride: 94 mEq/L — ABNORMAL LOW (ref 96–112)
Potassium: 3.5 mEq/L (ref 3.5–5.1)
Total Bilirubin: 0.2 mg/dL — ABNORMAL LOW (ref 0.3–1.2)

## 2011-08-28 LAB — DIFFERENTIAL
Basophils Relative: 0 % (ref 0–1)
Lymphocytes Relative: 20 % (ref 12–46)
Lymphs Abs: 1.7 10*3/uL (ref 0.7–4.0)
Monocytes Relative: 5 % (ref 3–12)
Neutro Abs: 6.1 10*3/uL (ref 1.7–7.7)
Neutrophils Relative %: 73 % (ref 43–77)

## 2011-08-28 LAB — CBC
Hemoglobin: 12.2 g/dL (ref 12.0–15.0)
RBC: 3.87 MIL/uL (ref 3.87–5.11)
WBC: 8.4 10*3/uL (ref 4.0–10.5)

## 2011-08-28 LAB — GLUCOSE, CAPILLARY
Glucose-Capillary: 520 mg/dL — ABNORMAL HIGH (ref 70–99)
Glucose-Capillary: 105 mg/dL — ABNORMAL HIGH (ref 70–99)
Glucose-Capillary: 61 mg/dL — ABNORMAL LOW (ref 70–99)

## 2011-08-28 LAB — HCG, QUANTITATIVE, PREGNANCY: hCG, Beta Chain, Quant, S: <1 m[IU]/mL (ref <5)

## 2011-08-28 MED ORDER — LORAZEPAM 2 MG/ML IJ SOLN
1.0000 mg | Freq: Once | INTRAMUSCULAR | Status: AC
  Administered 2011-08-28: 1 mg via INTRAVENOUS
  Filled 2011-08-28: qty 1

## 2011-08-28 MED ORDER — INSULIN ASPART 100 UNIT/ML ~~LOC~~ SOLN
10.0000 [IU] | Freq: Once | SUBCUTANEOUS | Status: AC
  Administered 2011-08-28: 10 [IU] via INTRAVENOUS
  Filled 2011-08-28: qty 1

## 2011-08-28 MED ORDER — FENTANYL CITRATE 0.05 MG/ML IJ SOLN
50.0000 ug | Freq: Once | INTRAMUSCULAR | Status: AC
  Administered 2011-08-28: 50 ug via INTRAVENOUS
  Filled 2011-08-28: qty 2

## 2011-08-28 MED ORDER — METOCLOPRAMIDE HCL 5 MG/ML IJ SOLN
10.0000 mg | Freq: Once | INTRAMUSCULAR | Status: AC
  Administered 2011-08-28: 10 mg via INTRAVENOUS
  Filled 2011-08-28: qty 2

## 2011-08-28 MED ORDER — ACETAMINOPHEN 325 MG PO TABS
650.0000 mg | ORAL_TABLET | Freq: Once | ORAL | Status: AC
  Administered 2011-08-28: 650 mg via ORAL
  Filled 2011-08-28: qty 2

## 2011-08-28 NOTE — ED Notes (Signed)
MD aware of BG 61; pt given Coke to drink and peanut butter crackers; pt awake, alert, oriented; significant other at bedside; skin dark, warm, dry

## 2011-08-28 NOTE — ED Notes (Signed)
Blood for labs drawn from lateral aspect of left breast via butterfly needle - pt tolerated procedure well

## 2011-08-28 NOTE — ED Notes (Signed)
Patient transported to CT 

## 2011-08-28 NOTE — ED Notes (Signed)
Patient transported from CT 

## 2011-08-28 NOTE — ED Provider Notes (Signed)
History     CSN: [id number]  Arrival date & time 08/28/11  1903   First MD Initiated Contact with Patient 08/28/11 1919      Chief Complaint  Patient presents with  . Seizures    BS 400 mg/dl while at dialysis today; took insulin upon arrival home with subsequent seizure activity lasting approx 1 min; post-ictal upon arrival of EMS - presently awake, alert      (Consider location/radiation/quality/duration/timing/severity/associated sxs/prior treatment) HPI Pt presents via ems s/p witnessed total body convusions, LOC, with apparent post ictal period c/w gtc sz.  Lasted about 2 minutes.  Sx's moderate, no aggravating or alleviating factors.  Pt has no h/o sz in past.  Pt finished full session of dialysis today, cbg at dialysis was elevated in 400's, was at home when sz occurred. Past Medical History  Diagnosis Date  . Diabetes mellitus type 1     Pt states diagnosed at age 41 with prior episodes of DKA. S/p pancreatic transplant   . Hypertension   . Hyperlipidemia   . Alopecia   . GERD (gastroesophageal reflux disease)   . Obesity   . Iron deficiency   . 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 RLS (restless legs syndrome)   . Respiratory failure   . Injury of conjunctiva and corneal abrasion of right eye without foreign body   . Cellulitis   . Dialysis patient     Tues; Thurs; Sat; [place]  . Angina   . Depression   . 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 Anemia   . Pneumonia 2012    "double"  . Blood transfusion 2004    "when I had my transplant"  . Migraine   . 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 Hyperparathyroidism, secondary   . Diabetic retinopathy     Past Surgical History  Procedure Date  . 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 Thyroglobulin     x 7  . Av fistula placement     right  upper arm  . Cholecystectomy 1995  .  hd graft placement/removal     "had 2 in my left upper arm"  . Eye surgery   . Retinopathy surgery     History reviewed. No pertinent family history.  History  Substance Use Topics  . Smoking status: Never Smoker   . Smokeless tobacco: Never Used  . Alcohol Use: No    OB History    Grav Para Term Preterm Abortions TAB SAB Ect Mult Living                  Review of Systems  Neurological: Positive for seizures and headaches.  All other systems reviewed and are negative.    Allergies  Morphine and related; Penicillins; Flagyl; and Tramadol  Home Medications   Current Outpatient Rx  Name Route Sig Dispense Refill  . ALBUTEROL SULFATE HFA 108 (90 BASE) MCG/ACT IN AERS Inhalation Inhale 2 puffs into the lungs every 4 (four) hours as needed. For wheezing.     Marland Kitchen AMLODIPINE BESYLATE-VALSARTAN 10-160 MG PO TABS Oral Take 1 tablet by mouth daily.    . ASPIRIN 81 MG PO TABS Oral Take 81 mg by mouth daily.      Marland Kitchen CALCIUM ACETATE 667 MG PO CAPS Oral Take 1,334-2,001 mg  by mouth 3 (three) times daily with meals. Dosage depends on meal consumed    . CLONAZEPAM 0.5 MG PO TABS Oral Take 2 mg by mouth at bedtime. For anxiety    . CYCLOBENZAPRINE HCL 5 MG PO TABS Oral Take 1 tablet by mouth Three times daily as needed. For muscle spasms.    Marland Kitchen LANTUS SOLOSTAR 100 UNIT/ML [place] SOLN Subcutaneous Inject 7 Units into the skin 2 (two) times daily.     Marland Kitchen METOPROLOL SUCCINATE ER 100 MG PO TB24 Oral Take 100 mg by mouth at bedtime. Do not take the night before dialysis. Dialysis on Tues, Thurs & Sat.    Marland Kitchen MYCOPHENOLATE MOFETIL 250 MG PO CAPS Oral Take 250 mg by mouth 2 (two) times daily.     Marland Kitchen NOVOLOG FLEXPEN 100 UNIT/ML [place] SOLN Subcutaneous Inject 3 Units into the skin 3 (three) times daily as needed. As directed. Per sliding scale.  >170= 2 units (on top of the three) >200= 3 units (on top of the three) >300= 4 units (on top of the three) >400= 5 units (on  top of the three) >500= call MD Only uses if she eats.    Marland Kitchen PANTOPRAZOLE SODIUM 40 MG PO TBEC Oral Take 1 tablet by mouth Every morning.    Marland Kitchen PREDNISONE 5 MG PO TABS Oral Take 1 tablet by mouth daily.    Marland Kitchen PROMETHAZINE HCL 25 MG PO TABS Oral Take 25 mg by mouth every 8 (eight) hours as needed. For nausea.    Marland Kitchen TACROLIMUS 1 MG PO CAPS Oral Take 1 mg by mouth 2 (two) times daily.      Marland Kitchen ZOLPIDEM TARTRATE 10 MG PO TABS Oral Take 1 tablet by mouth at bedtime. For sleep.      BP 140/76  Pulse 84  Temp(Src) 98.1 F (36.7 C) (Oral)  Resp 20  SpO2 99%  Physical Exam  Nursing note and vitals reviewed. Constitutional: She appears well-developed and well-nourished.  HENT:  Head: Normocephalic and atraumatic.  Eyes: Right eye exhibits no discharge. Left eye exhibits no discharge.  Neck: Normal range of motion. Neck supple.  Cardiovascular: Normal rate, regular rhythm and normal heart sounds.   Pulmonary/Chest: Effort normal and breath sounds normal.  Abdominal: Soft. There is no tenderness.  Musculoskeletal: She exhibits no edema and no tenderness.  Neurological: She is alert. She has normal strength. No cranial nerve deficit or sensory deficit. Gait normal. GCS eye subscore is 4. GCS verbal subscore is 5. GCS motor subscore is 6.  Skin: Skin is warm and dry.  Psychiatric: She has a normal mood and affect. Her behavior is normal.    ED Course  Procedures (including critical care time)  Labs Reviewed  GLUCOSE, CAPILLARY - Abnormal; Notable for the following:    Glucose-Capillary 520 (*)    All other components within normal limits  CBC - Abnormal; Notable for the following:    HCT 35.8 (*)    All other components within normal limits  COMPREHENSIVE METABOLIC PANEL - Abnormal; Notable for the following:    Chloride 94 (*)    Glucose, Bld 141 (*)    BUN 37 (*)    Creatinine, Ser 7.15 (*)    Total Bilirubin 0.2 (*)    GFR calc non Af Amer 7 (*)    GFR calc Af Amer 8 (*)    All  other components within normal limits  GLUCOSE, CAPILLARY - Abnormal; Notable for the following:    Glucose-Capillary 143 (*)  All other components within normal limits  GLUCOSE, CAPILLARY - Abnormal; Notable for the following:    Glucose-Capillary 50 (*)    All other components within normal limits  GLUCOSE, CAPILLARY - Abnormal; Notable for the following:    Glucose-Capillary 105 (*)    All other components within normal limits  GLUCOSE, CAPILLARY - Abnormal; Notable for the following:    Glucose-Capillary 61 (*)    All other components within normal limits  GLUCOSE, CAPILLARY - Abnormal; Notable for the following:    Glucose-Capillary 66 (*)    All other components within normal limits  GLUCOSE, CAPILLARY - Abnormal; Notable for the following:    Glucose-Capillary 135 (*)    All other components within normal limits  GLUCOSE, CAPILLARY - Abnormal; Notable for the following:    Glucose-Capillary 124 (*)    All other components within normal limits  DIFFERENTIAL  HCG, QUANTITATIVE, PREGNANCY  LAB REPORT - SCANNED   Ct Head Wo Contrast  08/28/2011  *RADIOLOGY REPORT*  Clinical Data: Seizure.  CT HEAD WITHOUT CONTRAST  Technique:  Contiguous axial images were obtained from the base of the skull through the vertex without contrast.  Comparison: 11/13/2010  Findings: Globus pallidus and cerebellar dentate nucleus calcifications are likely physiologic.  The brain stem, cerebral peduncles, and thalami appear unremarkable.  The ventricular system and basilar cisterns appear normal.  There appears to be calcification along the upper margin of the posterior chamber of the left globe, query retinal calcification. Finding unchanged from 2009.  No intracranial hemorrhage, mass lesion, or acute infarction is identified.  IMPRESSION:  1.  Calcifications of the dentate nuclei of the cerebellum and in the globus pallidus nuclei, probably physiologic. 2.  Lucency in the left globus pallidus nucleus is  probably a dilated perivascular space and less likely a remote lacunar infarct. 3.  Chronic calcification along the upper portion of the left retina.  Original Report Authenticated By: Dellia Cloud, M.D.     1. Seizure       MDM  Pt is in nad, afvss, nontoxic appearing, exam and hx as above.  Pt with first time sz.  A&o now, nl exam, no focal neuro deficits.  Giving iv ativan, fsbs elevated, giving iv insulin, getting ct head, basic labs.   Pts cbg 50, given 1 amp d50 and eating, will watch cbg closely  2334 pt still with HA, requesting more pain meds, will give reglan  0000 last cbg 124 about 20 minutes ago, will recheck  cbg wnl, pt to f/u with neurology.  Have advised her to eat when she gets home and to check her glucose before going to sleep, if lower than 80 needs to eat and recheck  Elijio Miles, MD 08/29/11 1702

## 2011-08-29 LAB — GLUCOSE, CAPILLARY: Glucose-Capillary: 135 mg/dL — ABNORMAL HIGH (ref 70–99)

## 2011-08-29 NOTE — Discharge Instructions (Signed)
Driving and Equipment Restrictions Some medical problems make it dangerous to drive, ride a bike, or use machines. Some of these problems are:  A hard blow to the head (concussion).   Passing out (fainting).   Twitching and shaking (seizures).   Low blood sugar.   Taking medicine to help you relax (sedatives).   Taking pain medicines.   Wearing an eye patch.   Wearing splints. This can make it hard to use parts of your body that you need to drive safely.  HOME CARE   Do not drive until your doctor says it is okay.   Do not use machines until your doctor says it is okay.  You may need a form signed by your doctor (medical release) before you can drive again. You may also need this form before you do other tasks where you need to be fully alert. MAKE SURE YOU:  Understand these instructions.   Will watch your condition.   Will get help right away if you are not doing well or get worse.  Document Released: 06/21/2004 Document Revised: 05/03/2011 Document Reviewed: 09/21/2009 [facility] Patient Information 2012 [place], [place].Seizure, Adult A seizure is when the body shakes uncontrollably (convulsion). It can be a scary experience. A seizure is not a diagnosis. It is a sign that something else may be wrong with brain and/or spinal cord (central nervous system). In the Emergency Department, your condition is evaluated. The seizure is then treated. You will likely need follow-up with your caregiver. You will possibly need further testing and evaluation. Your caregiver or the specialist to whom you are referred will determine if further treatment is needed. After a seizure, you may be confused, dazed and drowsy. These problems (symptoms) often follow a seizure. Medication given to treat the seizure may also cause some of these changes. The time following a seizure is known as a refractory period. Hospital admission is seldom required unless there are other conditions present such as trauma  or metabolic problems. Sometimes the seizure activity follows a fainting episode. This may have been caused by a brief drop in blood pressure. These fainting (syncopal) seizures are generally not a cause for concern.  HOME CARE INSTRUCTIONS   Follow up with your caregiver as suggested.   If any problems happen, get help right away.   Do not swim or drive until your caregiver says it is okay.  Document Released: 05/11/2000 Document Revised: 05/03/2011 Document Reviewed: 05/02/2011 [facility] Patient Information 2012 [place], [place].  Return for any new or worsening symptoms or any other concerns.

## 2011-08-29 NOTE — ED Notes (Addendum)
Per EMS, pt had complete run of dialysis today, went home, checked blood glucose which was "high"; pt took insulin (sliding scale) SQ; 15 min later, felt "funny" while talking on phone, subsequently handed fiance' telephone and seized lasting approx 2-3 minutes; upon arrival of EMS, pt was post-ictal; no known hx of seizures; upon arrival to ED, pt was awake, alert, oriented x3 - able to recall all events of evening

## 2011-08-29 NOTE — ED Notes (Addendum)
Blood glucose 135 mg/dl.

## 2011-08-30 NOTE — ED Provider Notes (Signed)
I saw and evaluated the patient, reviewed the resident's note and I agree with the findings and plan.   .Face to face Exam:  General:  Awake HEENT:  Atraumatic Resp:  Normal effort Abd:  Nondistended Neuro:No focal weakness Lymph: No adenopathy   Bellamie Turney L Jeroline Wolbert, MD 08/30/11 1117 

## 2011-09-24 ENCOUNTER — Ambulatory Visit: Payer: Medicare Other | Admitting: Neurology

## 2011-09-26 ENCOUNTER — Ambulatory Visit: Payer: Medicare Other | Admitting: Endocrinology

## 2011-09-28 ENCOUNTER — Encounter: Payer: Medicare Other | Admitting: Internal Medicine

## 2011-09-28 NOTE — Progress Notes (Signed)
 This encounter was created in error - please disregard.

## 2011-10-02 ENCOUNTER — Ambulatory Visit: Payer: Medicare Other | Admitting: Internal Medicine

## 2011-10-04 ENCOUNTER — Other Ambulatory Visit: Payer: Self-pay

## 2011-10-04 DIAGNOSIS — T82898A Other specified complication of vascular prosthetic devices, implants and grafts, initial encounter: Secondary | ICD-10-CM

## 2011-10-10 HISTORY — PX: TOOTH EXTRACTION: SUR596

## 2011-10-11 ENCOUNTER — Ambulatory Visit: Payer: Medicare Other | Admitting: Vascular Surgery

## 2011-10-11 ENCOUNTER — Encounter: Payer: Self-pay | Admitting: Vascular Surgery

## 2011-10-12 ENCOUNTER — Ambulatory Visit: Payer: Medicare Other | Admitting: Vascular Surgery

## 2011-10-15 ENCOUNTER — Encounter: Payer: Self-pay | Admitting: Vascular Surgery

## 2011-10-16 ENCOUNTER — Ambulatory Visit (INDEPENDENT_AMBULATORY_CARE_PROVIDER_SITE_OTHER): Payer: Medicare Other | Admitting: *Deleted

## 2011-10-16 ENCOUNTER — Ambulatory Visit (INDEPENDENT_AMBULATORY_CARE_PROVIDER_SITE_OTHER): Payer: Medicare Other | Admitting: Vascular Surgery

## 2011-10-16 ENCOUNTER — Encounter: Payer: Self-pay | Admitting: Vascular Surgery

## 2011-10-16 VITALS — BP 104/64 | HR 99 | Resp 16 | Ht 61.0 in | Wt 167.3 lb

## 2011-10-16 DIAGNOSIS — M79609 Pain in unspecified limb: Secondary | ICD-10-CM

## 2011-10-16 DIAGNOSIS — N186 End stage renal disease: Secondary | ICD-10-CM | POA: Insufficient documentation

## 2011-10-16 DIAGNOSIS — T82898A Other specified complication of vascular prosthetic devices, implants and grafts, initial encounter: Secondary | ICD-10-CM

## 2011-10-16 NOTE — Progress Notes (Signed)
The patient presents today with concern regarding pain in her hand. She is very complex past history for renal access. She had a right upper arm basilic vein transposition several years ago and used this and eventually developed steal. In August of 2011 she underwent a DRIL procedure for treatment of her steal. She did have a good result from this initially but over the last several months has had progressively severe pain in her right hand. She reports that since aching continues all the time and he is worse with hemodialysis. She has no tissue loss. She does have multiple prior infected removed left upper arm graft. She has not had any leg grafts.  Past Medical History  Diagnosis Date  . Diabetes mellitus type 1     Pt states diagnosed at age 57 with prior episodes of DKA. S/p pancreatic transplant   . Hypertension   . Hyperlipidemia   . Alopecia   . GERD (gastroesophageal reflux disease)   . Obesity   . Iron deficiency   . 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 RLS (restless legs syndrome)   . Respiratory failure   . Injury of conjunctiva and corneal abrasion of right eye without foreign body   . Cellulitis   . Dialysis patient     Tues; Thurs; Sat; [place]  . Angina   . Depression   . 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 Anemia   . Pneumonia 2012    "double"  . Blood transfusion 2004    "when I had my transplant"  . Migraine   . 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 Hyperparathyroidism, secondary   . Diabetic retinopathy     History  Substance Use Topics  . Smoking status: Never Smoker   . Smokeless tobacco: Never Used  . Alcohol Use: No    History reviewed. No pertinent family history.  Allergies  Allergen Reactions  . Morphine And Related Nausea And Vomiting and Other (See Comments)    "makes me delerirous"  . Penicillins Rash    . Flagyl (Metronidazole Hcl) Nausea And Vomiting  . Tramadol Nausea And Vomiting    Current outpatient prescriptions:albuterol (PROVENTIL HFA;VENTOLIN HFA) 108 (90 BASE) MCG/ACT inhaler, Inhale 2 puffs into the lungs every 4 (four) hours as needed. For wheezing. , Disp: , Rfl: ;  amLODipine-valsartan (EXFORGE) 10-160 MG per tablet, Take 1 tablet by mouth daily., Disp: , Rfl: ;  aspirin 81 MG tablet, Take 81 mg by mouth daily.  , Disp: , Rfl:  calcium acetate (PHOSLO) 667 MG capsule, Take 1,334-2,001 mg by mouth 3 (three) times daily with meals. Dosage depends on meal consumed, Disp: , Rfl: ;  clonazePAM (KLONOPIN) 0.5 MG tablet, Take 2 mg by mouth at bedtime. For anxiety, Disp: , Rfl: ;  LANTUS SOLOSTAR 100 UNIT/ML injection, Inject 7 Units into the skin 2 (two) times daily. , Disp: , Rfl:  metoprolol succinate (TOPROL-XL) 100 MG 24 hr tablet, Take 100 mg by mouth at bedtime. Do not take the night before dialysis. Dialysis on Tues, Thurs & Sat., Disp: , Rfl: ;  mycophenolate (CELLCEPT) 250 MG capsule, Take 250 mg by mouth 2 (two) times daily. , Disp: , Rfl:  NOVOLOG FLEXPEN 100 UNIT/ML injection, Inject 3 Units into the skin 3 (three) times daily as needed. As directed. Per sliding scale.  >170= 2  units (on top of the three) >200= 3 units (on top of the three) >300= 4 units (on top of the three) >400= 5 units (on top of the three) >500= call MD Only uses if she eats., Disp: , Rfl: ;  pantoprazole (PROTONIX) 40 MG tablet, Take 1 tablet by mouth Every morning., Disp: , Rfl:  predniSONE (DELTASONE) 5 MG tablet, Take 1 tablet by mouth daily., Disp: , Rfl: ;  promethazine (PHENERGAN) 25 MG tablet, Take 25 mg by mouth every 8 (eight) hours as needed. For nausea., Disp: , Rfl: ;  tacrolimus (PROGRAF) 1 MG capsule, Take 1 mg by mouth 2 (two) times daily.  , Disp: , Rfl: ;  zolpidem (AMBIEN) 10 MG tablet, Take 1 tablet by mouth at bedtime. For sleep., Disp: , Rfl:  cyclobenzaprine (FLEXERIL) 5 MG tablet, Take 1  tablet by mouth Three times daily as needed. For muscle spasms., Disp: , Rfl: ;  DISCONTD: albuterol (PROAIR HFA) 108 (90 BASE) MCG/ACT inhaler, Inhale 2 puffs into the lungs every 4 (four) hours as needed for wheezing. 2 puffs every 4-6 hours as needed, Disp: 1 Inhaler, Rfl: 2;  DISCONTD: amLODipine-valsartan (EXFORGE) 10-160 MG per tablet, Take 1 tablet by mouth daily., Disp: , Rfl:  DISCONTD: NORVASC 10 MG tablet, Take 1 tablet by mouth Daily., Disp: , Rfl:   BP 104/64  Pulse 99  Resp 16  Ht 5\' 1"  (1.549 m)  Wt 167 lb 4.8 oz (75.887 kg)  BMI 31.61 kg/m2  SpO2 93%  Body mass index is 31.61 kg/(m^2).       Physical exam well-developed well-nourished black female no acute distress Diminished right radial pulse 2+ left radial pulse. She has an excellent thrill in her upper arm fistula. He does have palpable dorsalis pedis pulses bilaterally and no tissue loss on her feet bilaterally.  Noninvasive vascular studies her office reveal normal pressures and waveforms at the radial brachial and ulnar level bilaterally.  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 She is status post surgical treatment of this and August of 2011 with recurrent steal symptoms. I felt her only option is to ligate her right upper arm basilic vein transposition fistula for relief of her steal symptoms. She reports that she is unable to tolerate his degree of discomfort. I explained her next option would be a leg graft and would recommend placement of a right femoral loop graft. Since her steal is not causing critical difficulty currently I would recommend placement of a right femoral loop graft and then used this existing fistula on the right upper arm until the leg graft is ready for access. I then accessed the leg graft and ligate the upper arm fistula. She is comfortable deciding on this today and will notify us should she wish to proceed. She would be admitted for overnight stay for placement of a right  femoral loop graft

## 2011-10-17 ENCOUNTER — Encounter: Payer: Medicare Other | Admitting: Internal Medicine

## 2011-10-17 NOTE — Progress Notes (Signed)
 This encounter was created in error - please disregard.

## 2011-10-19 ENCOUNTER — Telehealth: Payer: Self-pay

## 2011-10-23 ENCOUNTER — Ambulatory Visit: Payer: Medicare Other | Admitting: Endocrinology

## 2011-10-24 ENCOUNTER — Ambulatory Visit: Payer: Medicare Other | Admitting: Vascular Surgery

## 2011-10-29 NOTE — Telephone Encounter (Signed)
error 

## 2011-10-31 ENCOUNTER — Encounter: Payer: Self-pay | Admitting: Vascular Surgery

## 2011-10-31 ENCOUNTER — Encounter: Payer: Self-pay | Admitting: *Deleted

## 2011-10-31 ENCOUNTER — Ambulatory Visit (INDEPENDENT_AMBULATORY_CARE_PROVIDER_SITE_OTHER): Payer: Medicare Other | Admitting: Vascular Surgery

## 2011-10-31 ENCOUNTER — Ambulatory Visit: Payer: Medicare Other | Admitting: Internal Medicine

## 2011-10-31 VITALS — BP 177/93 | HR 75 | Temp 97.9°F | Ht 61.0 in | Wt 169.0 lb

## 2011-10-31 DIAGNOSIS — T82898A Other specified complication of vascular prosthetic devices, implants and grafts, initial encounter: Secondary | ICD-10-CM

## 2011-10-31 DIAGNOSIS — Z0289 Encounter for other administrative examinations: Secondary | ICD-10-CM

## 2011-10-31 DIAGNOSIS — N186 End stage renal disease: Secondary | ICD-10-CM

## 2011-10-31 MED ORDER — OXYCODONE-ACETAMINOPHEN 5-325 MG PO TABS: 1.0000 | ORAL_TABLET | ORAL | Status: DC | PRN

## 2011-10-31 NOTE — Progress Notes (Signed)
[facility] [place]  Patient name: [name] MRN: [id number] DOB: 12/21/1970 Sex: female  REASON FOR VISIT: right hand pain with steal syndrome right upper extremity  HPI: [name] is a 41 y.o. female who has a known left subclavian vein occlusion. Her most recent access was a right basilic vein transposition. She ultimately developed steal and in August of 2011 underwent a DRIL procedure which resolved her symptoms. However she was evaluated on 10/16/2011 with evidence of recurrent steal symptoms. She was being considered for a thigh graft and asked to discuss this further.  She has fairly constant pain in the right hand. He does not have any history of carpal tunnel syndrome and does not use her hand for repetitive tasks. The fistula has been working well.  She's had no recent uremic symptoms. Specifically she denies nausea, vomiting, palpitations, fatigue, or anorexia.   REVIEW OF SYSTEMS: [X ] denotes positive finding; [  ] denotes negative finding  CARDIOVASCULAR:  [ ] chest pain   [ ] dyspnea on exertion    CONSTITUTIONAL:  [ ] fever   [ ] chills  PHYSICAL EXAM: Filed Vitals:   10/31/11 1356  BP: 177/93  Pulse: 75  Temp: 97.9 F (36.6 C)  TempSrc: Oral  Height: 5' 1" (1.549 m)  Weight: 169 lb (76.658 kg)   Body mass index is 31.93 kg/(m^2). GENERAL: The patient is a well-nourished female, in no acute distress. The vital signs are documented above. CARDIOVASCULAR: There is a regular rate and rhythm  PULMONARY: There is good air exchange bilaterally without wheezing or rales. Her right upper arm fistula has an excellent thrill. As a palpable radial pulse. She has a good radial, ulnar, and palm or arch signal with the Doppler. These do augment with compression of her fistula.  MEDICAL ISSUES: I have recommended that we perform a right upper extremity arteriogram in order to evaluate her fistula, bypass in the right arm, and native arteries.  Potentially we'll find something that could be addressed to help resolve her pain in the right hand and the same time salvage her fistula. Her arteriogram is scheduled for 11/12/2011. We'll make further recommendations pending these results. We have discussed the procedure potential complications and she is agreeable to proceed.  Zivah Mayr S [facility] [place] Beeper: [phone]     

## 2011-11-01 ENCOUNTER — Other Ambulatory Visit: Payer: Self-pay | Admitting: *Deleted

## 2011-11-05 ENCOUNTER — Encounter (HOSPITAL_COMMUNITY): Payer: Self-pay | Admitting: Cardiology

## 2011-11-05 ENCOUNTER — Emergency Department (INDEPENDENT_AMBULATORY_CARE_PROVIDER_SITE_OTHER)
Admission: EM | Admit: 2011-11-05 | Discharge: 2011-11-05 | Disposition: A | Discharge status: Home / Self Care | Payer: Medicare Other | Source: Home / Self Care | Attending: Family Medicine | Admitting: Family Medicine

## 2011-11-05 DIAGNOSIS — N764 Abscess of vulva: Secondary | ICD-10-CM

## 2011-11-05 MED ORDER — LIDOCAINE HCL (PF) 1 % IJ SOLN
INTRAMUSCULAR | Status: AC
  Filled 2011-11-05: qty 5

## 2011-11-05 MED ORDER — CEFTRIAXONE SODIUM 1 G IJ SOLR
1.0000 g | Freq: Once | INTRAMUSCULAR | Status: AC
  Administered 2011-11-05: 1 g via INTRAMUSCULAR

## 2011-11-05 MED ORDER — DOXYCYCLINE HYCLATE 100 MG PO CAPS: 100.0000 mg | ORAL_CAPSULE | Freq: Two times a day (BID) | ORAL | Status: DC

## 2011-11-05 MED ORDER — CEFTRIAXONE SODIUM 1 G IJ SOLR
INTRAMUSCULAR | Status: AC
  Filled 2011-11-05: qty 10

## 2011-11-05 NOTE — Discharge Instructions (Signed)
Keep wound clean and dry. Can clean with soap and water but dry well before covering. Can lead warm water run over the wound while in the shower. Okay if the packing falls out. Take the prescribed medications as instructed. Can take over-the-counter ibuprofen every 8 hours alternating with your previously prescribed Roxicet as previously instructed for pain. Return here in 24-48 hours for wound recheck and packing removal.  Go to the emergency department if worsening pain or swelling despite following treatment.

## 2011-11-05 NOTE — ED Notes (Signed)
Pt reports "boil" to right groin area that she noticed yesterday. Pt reports area is extremely painful. Pt had vomiting this past Sat and Sun.

## 2011-11-07 ENCOUNTER — Telehealth (HOSPITAL_COMMUNITY): Payer: Self-pay | Admitting: *Deleted

## 2011-11-07 LAB — CULTURE, ROUTINE-ABSCESS

## 2011-11-07 MED ORDER — CEPHALEXIN 500 MG PO CAPS: 500.0000 mg | ORAL_CAPSULE | Freq: Two times a day (BID) | ORAL | Status: DC

## 2011-11-07 NOTE — ED Provider Notes (Signed)
History     CSN: [id number]  Arrival date & time 11/05/11  1452   First MD Initiated Contact with Patient 11/05/11 1554      Chief Complaint  Patient presents with  . Recurrent Skin Infections    (Consider location/radiation/quality/duration/timing/severity/associated sxs/prior treatment) HPI Comments: 41 y/o female with h/o diabetes and ESRD. Here c/o pain and swelling in right vulvar area for 2 days. No spontaneous drainage. Denies fever or chills. Had episode of nausea and vomiting yesterday. No nausea or vomiting today.    Past Medical History  Diagnosis Date  . Diabetes mellitus type 1     Pt states diagnosed at age 31 with prior episodes of DKA. S/p pancreatic transplant   . Hypertension   . Hyperlipidemia   . Alopecia   . GERD (gastroesophageal reflux disease)   . Obesity   . Iron deficiency   . 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 RLS (restless legs syndrome)   . Respiratory failure   . Injury of conjunctiva and corneal abrasion of right eye without foreign body   . Cellulitis   . Dialysis patient     Tues; Thurs; Sat; [place]  . Angina   . Depression   . 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 Anemia   . Pneumonia 2012    "double"  . Blood transfusion 2004    "when I had my transplant"  . Migraine   . 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 Hyperparathyroidism, secondary   . Diabetic retinopathy     Past Surgical History  Procedure Date  . 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 Thyroglobulin     x 7  . Av fistula placement     right upper arm  . Cholecystectomy 1995  .  hd graft placement/removal     "had 2 in my left upper arm"  . Eye surgery   . Retinopathy surgery   . Tooth extraction 10/10/11    X's two    No family history on file.  History  Substance Use Topics  .  Smoking status: Never Smoker   . Smokeless tobacco: Never Used  . Alcohol Use: No    OB History    Grav Para Term Preterm Abortions TAB SAB Ect Mult Living                  Review of Systems  Constitutional: Negative for fever and chills.  Genitourinary: Negative for dysuria, vaginal discharge and pelvic pain.       As per HPI  Neurological: Negative for dizziness.    Allergies  Morphine and related; Flagyl; and Tramadol  Home Medications   Current Outpatient Rx  Name Route Sig Dispense Refill  . ALBUTEROL SULFATE HFA 108 (90 BASE) MCG/ACT IN AERS Inhalation Inhale 2 puffs into the lungs every 4 (four) hours as needed. For wheezing.     Marland Kitchen AMLODIPINE BESYLATE-VALSARTAN 10-160 MG PO TABS Oral Take 1 tablet by mouth daily.    . ASPIRIN 81 MG PO TABS Oral Take 81 mg by mouth daily.      Marland Kitchen CALCIUM ACETATE 667 MG PO CAPS Oral Take 1,334-2,001 mg by mouth 3 (three) times daily with meals. Dosage depends on meal consumed    . CLONAZEPAM 0.5 MG PO TABS Oral Take 2  mg by mouth at bedtime. For anxiety    . LANTUS SOLOSTAR 100 UNIT/ML [place] SOLN Subcutaneous Inject 7 Units into the skin 2 (two) times daily.     Marland Kitchen MYCOPHENOLATE MOFETIL 250 MG PO CAPS Oral Take 250 mg by mouth 2 (two) times daily.     Marland Kitchen NOVOLOG FLEXPEN 100 UNIT/ML [place] SOLN Subcutaneous Inject 3 Units into the skin 3 (three) times daily as needed. As directed. Per sliding scale.  >170= 2 units (on top of the three) >200= 3 units (on top of the three) >300= 4 units (on top of the three) >400= 5 units (on top of the three) >500= call MD Only uses if she eats.    Marland Kitchen PANTOPRAZOLE SODIUM 40 MG PO TBEC Oral Take 1 tablet by mouth Every morning.    Marland Kitchen PREDNISONE 5 MG PO TABS Oral Take 1 tablet by mouth daily.    Marland Kitchen PROMETHAZINE HCL 25 MG PO TABS Oral Take 25 mg by mouth every 8 (eight) hours as needed. For nausea.    Marland Kitchen TACROLIMUS 1 MG PO CAPS Oral Take 1 mg by mouth 2 (two) times daily.      Marland Kitchen ZOLPIDEM TARTRATE 10 MG PO TABS Oral Take  1 tablet by mouth at bedtime. For sleep.    . CYCLOBENZAPRINE HCL 5 MG PO TABS Oral Take 1 tablet by mouth Three times daily as needed. For muscle spasms.    Marland Kitchen DOXYCYCLINE HYCLATE 100 MG PO CAPS Oral Take 1 capsule (100 mg total) by mouth 2 (two) times daily. 20 capsule 0  . METOPROLOL SUCCINATE ER 100 MG PO TB24 Oral Take 100 mg by mouth at bedtime. Do not take the night before dialysis. Dialysis on Tues, Thurs & Sat.    . OXYCODONE-ACETAMINOPHEN 5-325 MG PO TABS Oral Take 1-2 tablets by mouth every 4 (four) hours as needed for pain. 20 tablet 0    BP 163/76  Pulse 80  Temp 98.4 F (36.9 C) (Oral)  Resp 18  SpO2 98%  LMP 10/10/2011  Physical Exam  Nursing note and vitals reviewed. Constitutional: She is oriented to person, place, and time. She appears well-developed and well-nourished. No distress.  HENT:  Head: Normocephalic and atraumatic.  Cardiovascular: Normal heart sounds.   Pulmonary/Chest: Breath sounds normal.  Abdominal: Soft. There is no tenderness.  Genitourinary:       External genitalia: Focal 3 cm diameter single area of induration, swelling, fluctuation and tenderness in mid labia majora on right side, impress superficial. No diffuse vulvar swelling or erythema, no spontaneous drainage. No obvious vaginal discharge.   Neurological: She is alert and oriented to person, place, and time.    ED Course  INCISION AND DRAINAGE Performed by: Sharin Grave Authorized by: Sharin Grave Consent: Verbal consent obtained. Consent given by: patient Type: abscess Body area: anogenital Location details: vulva Local anesthetic: lidocaine 1% with epinephrine Anesthetic total: 3 ml Scalpel size: 11 Incision type: single straight Complexity: simple Drainage: purulent Drainage amount: copious Packing material: 1/4 in iodoform gauze Patient tolerance: Patient tolerated the procedure well with no immediate complications. Comments: Abscess culture pending    (including critical care time)   Labs Reviewed  CULTURE, ROUTINE-ABSCESS   No results found.   1. Abscess of labia majora       MDM  Right labial abscess surgically drained. Does not impress deep infection. Patient had administer 1g Rocephin IMx1 prior discharge. Prescribed doxycyline. Culture pending asked to return in 24 h for packing removal and  wound check.  Return earlier if new onset of fever or worsening symptoms despite treatment.        Sharin Grave, MD 11/07/11 [phone]

## 2011-11-07 NOTE — ED Notes (Signed)
Dr. Tressia Danas asked me to call pt. to stop the Doxycycline and start Keflex 500 mg. BID x 7 days.  Abscess culture pos. for E. Coli. Dr. Alfonzo Feller Rx. to [facility] on [address]. Pt. was adequately treated with Rocephin IM. I called pt. but voicemail box was full. I called her contact.  Pt. was with Cecille Rubin when I called.  Pt. given this information. Pt.'s questions answered. Vassie Moselle 11/07/2011

## 2011-11-09 ENCOUNTER — Encounter (HOSPITAL_COMMUNITY): Payer: Self-pay | Admitting: Pharmacy Technician

## 2011-11-09 ENCOUNTER — Telehealth: Payer: Self-pay | Admitting: Internal Medicine

## 2011-11-09 NOTE — Telephone Encounter (Signed)
Message copied by Etheleen Sia on Fri Nov 09, 2011  4:56 PM ------      Message from: Etheleen Sia      Created: Fri Nov 09, 2011  3:08 PM      Regarding: FW: NEW PT       Pt is aware      ----- Message -----         From: Corwin Levins, MD         Sent: 11/08/2011  10:40 AM           To: Etheleen Sia      Subject: RE: NEW PT                                               Please do not re-schedule, as my panel is very full at this time      thanks      ----- Message -----         From: Etheleen Sia         Sent: 11/08/2011   9:43 AM           To: Corwin Levins, MD      Subject: NEW PT                                                   Norma Fredrickson call to reschedule her new pt appt.  She was originally scheduled when you were still taking Medicare patients.  She was a No-Show on March 20, then cancelled her May 7 appt. Then was a no-show on June 5 (all new pt appts with you)  She had also been [facility] Dr[facility].  She has cancelled both New appts with Dr. Everardo All.            Do you want [place] to re-schedule her with you?            [phone]

## 2011-11-12 ENCOUNTER — Ambulatory Visit (HOSPITAL_COMMUNITY)
Admission: RE | Admit: 2011-11-12 | Discharge: 2011-11-12 | Disposition: A | Discharge status: Home / Self Care | Payer: Medicare Other | Source: Ambulatory Visit | Attending: Vascular Surgery | Admitting: Vascular Surgery

## 2011-11-12 ENCOUNTER — Telehealth: Payer: Self-pay | Admitting: Internal Medicine

## 2011-11-12 ENCOUNTER — Other Ambulatory Visit: Payer: Self-pay | Admitting: *Deleted

## 2011-11-12 ENCOUNTER — Encounter (HOSPITAL_COMMUNITY)
Admission: RE | Disposition: A | Discharge status: Home / Self Care | Payer: Self-pay | Source: Ambulatory Visit | Attending: Vascular Surgery

## 2011-11-12 DIAGNOSIS — N186 End stage renal disease: Secondary | ICD-10-CM

## 2011-11-12 DIAGNOSIS — Z0389 Encounter for observation for other suspected diseases and conditions ruled out: Secondary | ICD-10-CM | POA: Insufficient documentation

## 2011-11-12 DIAGNOSIS — G458 Other transient cerebral ischemic attacks and related syndromes: Secondary | ICD-10-CM

## 2011-11-12 DIAGNOSIS — Z4931 Encounter for adequacy testing for hemodialysis: Secondary | ICD-10-CM

## 2011-11-12 HISTORY — PX: UNILATERAL UPPER EXTREMEITY ANGIOGRAM: SHX5517

## 2011-11-12 LAB — POCT I-STAT, CHEM 8
BUN: 56 mg/dL — ABNORMAL HIGH (ref 6–23)
Chloride: 105 mEq/L (ref 96–112)
Creatinine, Ser: 9.1 mg/dL — ABNORMAL HIGH (ref 0.50–1.10)
Potassium: 5.3 mEq/L — ABNORMAL HIGH (ref 3.5–5.1)
Sodium: 133 mEq/L — ABNORMAL LOW (ref 135–145)

## 2011-11-12 IMAGING — CT CT HEAD WO/W CM
4 of 8 series · 12 of 33 positions shown, 13 images · IV contrast (APPLIED)
Comparison: CT of the head performed 07/17/2008

CT HEAD

CLINICAL DATA: Left sided headache for 1 month, left-sided neck
swelling.

CT HEAD WITHOUT AND WITH CONTRAST
CT NECK WITH CONTRAST
TECHNIQUE: Contiguous axial images were obtained from the base of
the skull through the vertex without and with intravenous contrast.
Multidetector CT imaging of the neck was performed using the
standard protocol following the bolus administration of intravenous
contrast.
Contrast: 80 mL of Omnipaque 300 IV contrast

[Series 7: st neck 2.0 b31s · axial · 0.45mm/px · z∈[+1012,+1132]mm · 3 of 121 slices shown, 4 images]
[im 31/121  soft-tissue]
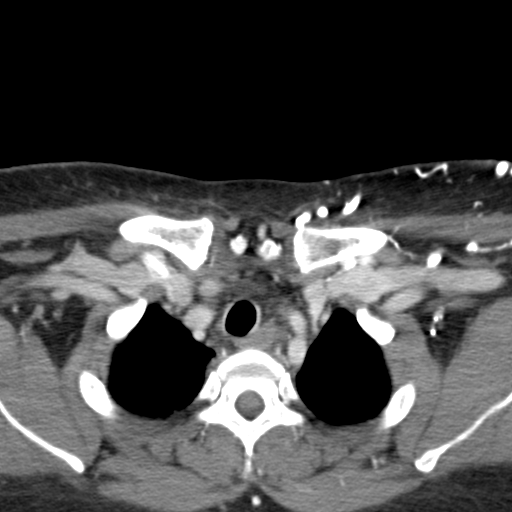
[im 31/121  bone]
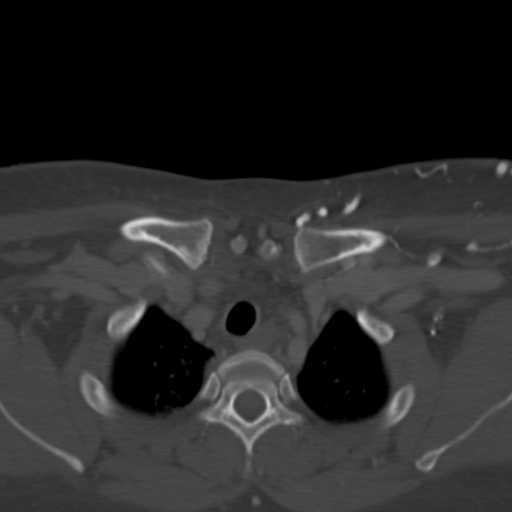
[im 61/121  bone]
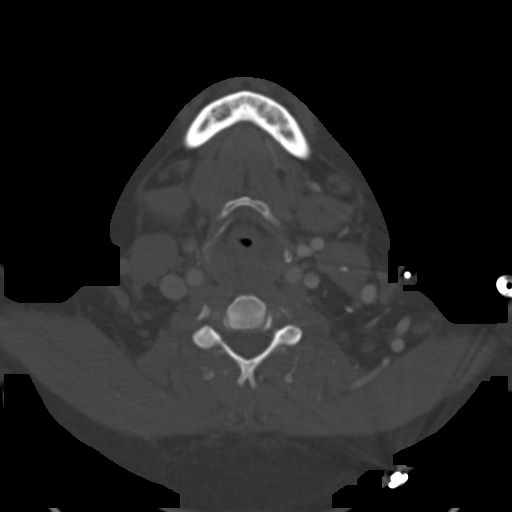
[im 91/121  bone]
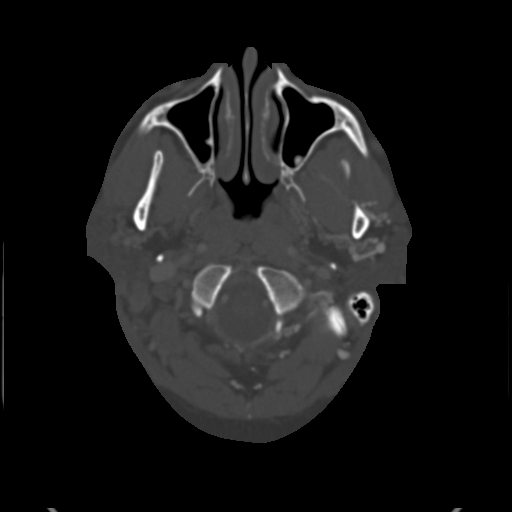

[Series 602: sag · sagittal · 0.47mm/px · 5 of 93 slices shown]
[im 14/93  bone]
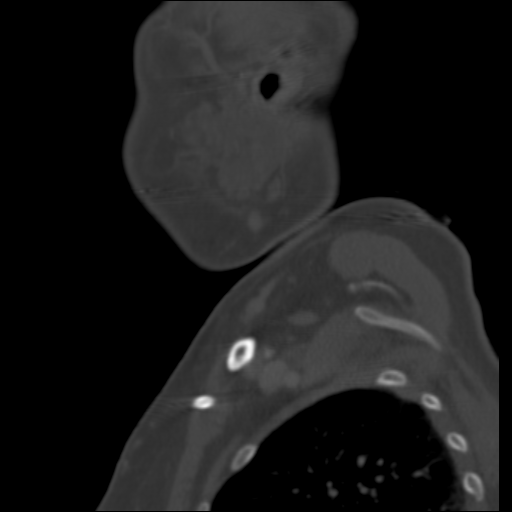
[im 27/93  bone]
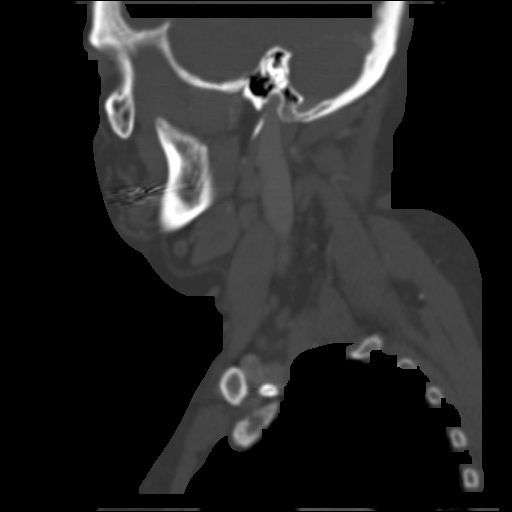
[im 40/93  bone]
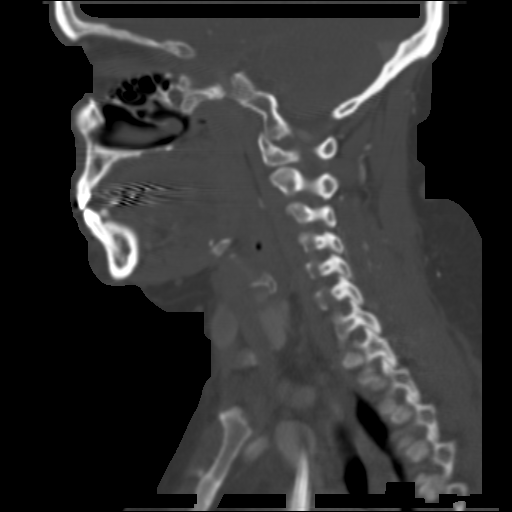
[im 53/93  bone]
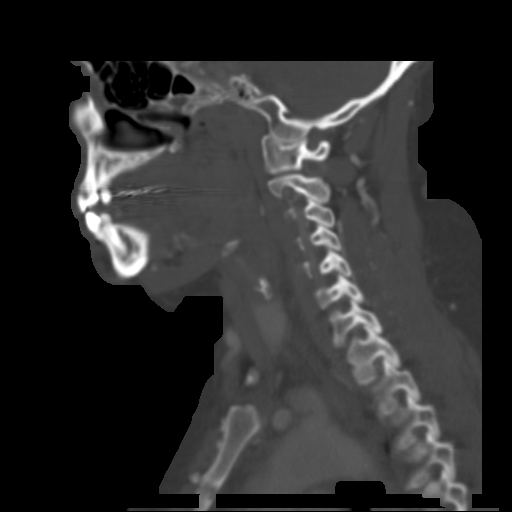
[im 66/93  bone]
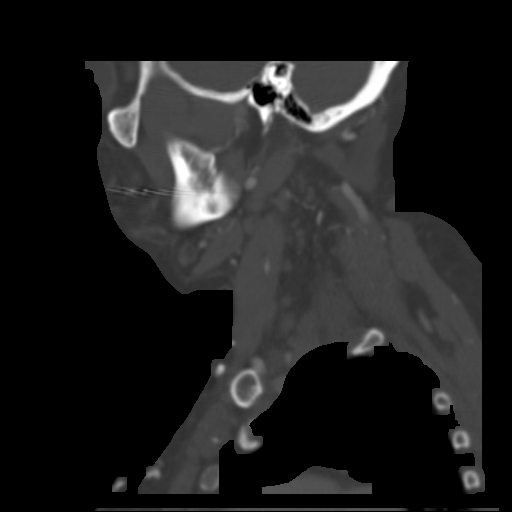

[Series 603: coronal · coronal · 0.47mm/px · 1 of 91 slices shown]
[im 46/91  bone]
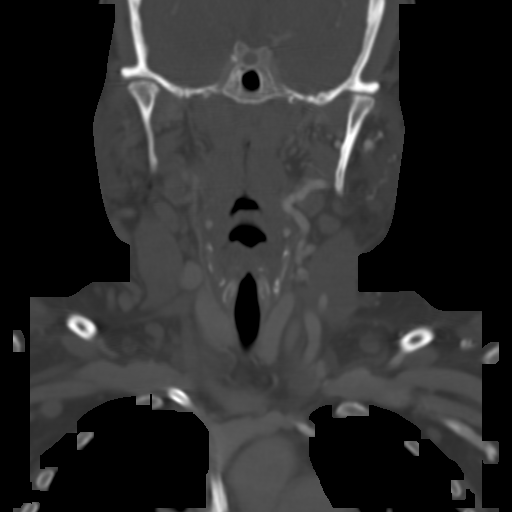

[Series 604: axial · axial · 0.47mm/px · z∈[+992,+1098]mm · 3 of 108 slices shown]
[im 27/108  bone]
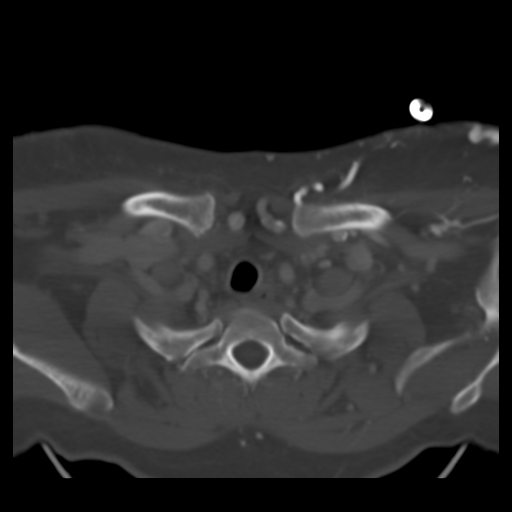
[im 54/108  bone]
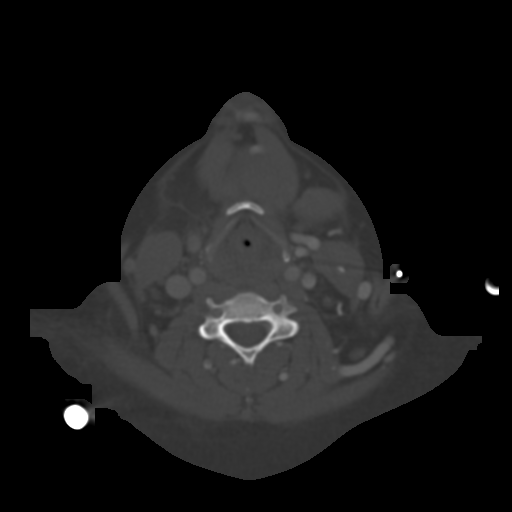
[im 81/108  bone]
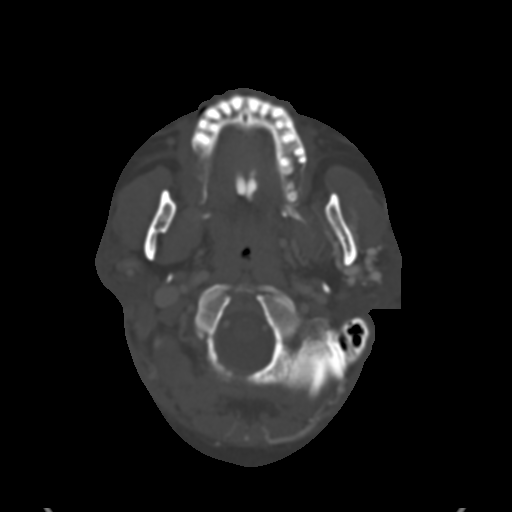

[12 of 33 positions shown; findings below may reference images not displayed]

FINDINGS: There is no evidence of acute infarction, mass lesion,
or intra- or extra-axial hemorrhage on CT. On postcontrast images,
no abnormal focal contrast enhancement is identified.

This study was performed to evaluate the dural venous sinuses.  The
dural venous sinuses appear fully patent.  There is no evidence of
venous sinus thrombosis.  The superior sagittal sinus is opacified
with contrast; the inferior sagittal sinus and sinus rectus are
also patent.  The left transverse sinus is mildly diminutive,
without evidence of occlusion.  The sigmoid sinuses appear patent
bilaterally.

The intracranial arterial vasculature appears grossly patent.  The
branches of the internal carotid arteries are grossly unremarkable,
and the vertebrobasilar system remains grossly intact.

Scattered calcification is identified within the basal ganglia, and
within the right cerebellar hemisphere.  The brainstem and fourth
ventricle are within normal limits.  The third and lateral
ventricles are unremarkable in appearance.  The cerebral
hemispheres are symmetric in appearance, with normal gray-white
differentiation.  No mass effect or midline shift is seen.

There is no evidence of fracture; visualized osseous structures are
unremarkable in appearance.  The visualized portions of the orbits
are within normal limits.  The paranasal sinuses and mastoid air
cells are well-aerated.  No significant soft tissue abnormalities
are seen.
IMPRESSION: 1.  No evidence of venous sinus thrombosis; the dural venous
sinuses appear fully patent.
2.  No acute intracranial pathology seen.

CT NECK
FINDINGS: Note is made of apparent marked focal stenosis or
occlusion of the left subclavian vein just proximal to its junction
with the left internal jugular vein.  This appears to reflect a web
within the vessel. There is associated collateralization of venous
vasculature throughout the soft tissues of the left side of the
neck and the upper left chest wall. The left internal jugular vein
remains patent, distal to the apparent stenosis or occlusion, and
continues along the brachiocephalic vein to the SVC.

Note is also made of occlusion of the inferiormost portion of the
right internal jugular vein, with a very large anterior collateral
vessel draining the right internal jugular vein into the right
subclavian vein.  This collateral vessel could reflect the right
middle thyroid vein.  There is also a smaller collateral vessel
extending across the midline, draining vessels from the left chest
wall into the large collateral vessel.

There is prominence of the bilateral internal thoracic veins; the
left sided chest wall collateral veins also drain into the left
internal thoracic vein.

There is an apparent non-opacified vessel extending from the left
subclavian vein along the left side the mediastinum; this may
reflect a chronically occluded left sided SVC.

Scattered bilateral small lymph nodes are seen, measuring up to
cm in short axis. No significant lymphadenopathy is appreciated. No
significant soft tissue edema is seen.

Calcification is noted at the carotid bifurcations bilaterally,
without definite significant stenosis.  The common carotid internal
carotid arteries remain patent.  No acute vascular abnormalities
are identified along the arterial vasculature.

The superior mediastinum is otherwise unremarkable in appearance.
The thyroid gland is within normal limits.  The visualized lung
apices are clear. The patient's right-sided [place] catheter is
seen extending into the right subclavian vein, at the junction of
the large collateral vessel replacing the right IJ and the
subclavian vein.

No acute osseous abnormalities are seen.
IMPRESSION: 1.  Apparent marked focal stenosis or occlusion of the left
subclavian vein, just proximal to its junction with the left
internal jugular vein, reflecting a web within the vessel.
Associated collateralization of the venous vasculature throughout
the soft tissues of the left side of the neck and upper chest wall.
2.  Likely chronically occluded left sided SVC.
3.  Occlusion of the inferiormost portion of the right internal
jugular vein, with a very large collateral vessel draining the
right internal jugular vein into the right subclavian vein.  This
could reflect the right middle thyroid vein; smaller associated
collaterals seen draining into this vein.
4.  Prominent bilateral internal thoracic veins, with drainage of
left-sided chest wall veins into the left internal thoracic vein.
5.  Calcification at both carotid bifurcations, without definite
significant arterial stenosis.
6.  No significant soft tissue edema seen.

## 2011-11-12 SURGERY — UNILATERAL UPPER EXTREMEITY ANGIOGRAM
Anesthesia: LOCAL

## 2011-11-12 MED ORDER — SODIUM CHLORIDE 0.9 % IJ SOLN: 3.0000 mL | INTRAMUSCULAR | Status: DC | PRN

## 2011-11-12 MED ORDER — ACETAMINOPHEN 500 MG PO TABS
ORAL_TABLET | ORAL | Status: AC
  Filled 2011-11-12: qty 2

## 2011-11-12 MED ORDER — LABETALOL HCL 5 MG/ML IV SOLN
INTRAVENOUS | Status: AC
  Filled 2011-11-12: qty 4

## 2011-11-12 MED ORDER — ONDANSETRON HCL 4 MG/2ML IJ SOLN: 4.0000 mg | Freq: Four times a day (QID) | INTRAMUSCULAR | Status: DC | PRN

## 2011-11-12 MED ORDER — SODIUM CHLORIDE 0.9 % IV SOLN: INTRAVENOUS | Status: DC

## 2011-11-12 MED ORDER — FENTANYL CITRATE 0.05 MG/ML IJ SOLN
INTRAMUSCULAR | Status: AC
  Filled 2011-11-12: qty 2

## 2011-11-12 MED ORDER — LIDOCAINE HCL (PF) 1 % IJ SOLN
INTRAMUSCULAR | Status: AC
  Filled 2011-11-12: qty 30

## 2011-11-12 MED ORDER — HEPARIN (PORCINE) IN NACL 2-0.9 UNIT/ML-% IJ SOLN
INTRAMUSCULAR | Status: AC
  Filled 2011-11-12: qty 1000

## 2011-11-12 MED ORDER — DEXTROSE 5 % IV SOLN: 1.5000 g | INTRAVENOUS | Status: DC

## 2011-11-12 MED ORDER — MIDAZOLAM HCL 2 MG/2ML IJ SOLN
INTRAMUSCULAR | Status: AC
  Filled 2011-11-12: qty 2

## 2011-11-12 MED ORDER — ACETAMINOPHEN 325 MG PO TABS
650.0000 mg | ORAL_TABLET | ORAL | Status: DC | PRN
  Administered 2011-11-12: 650 mg via ORAL

## 2011-11-12 MED ORDER — LABETALOL HCL 5 MG/ML IV SOLN
10.0000 mg | INTRAVENOUS | Status: DC | PRN
  Administered 2011-11-12: 10 mg via INTRAVENOUS

## 2011-11-12 NOTE — Op Note (Signed)
PATIENT: [name]   MRN: [id number] DOB: 1971/02/03    DATE OF PROCEDURE: 11/12/2011  INDICATIONS: [name] is a 41 y.o. female who had a right basilic vein transposition. 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 She underwent a DRIL procedure with an excellent result. However she gradually developed recurrent steal symptoms in her right upper extremity. She was being evaluated for possible thigh graft however we elected to proceed with arteriography to see if there is any further options to potentially salvage her right basilic vein transposition. She is brought in for arteriography.  PROCEDURE:  1. Placement of right femoral vein intravenous catheter. 2. Ultrasound-guided access to the left common femoral artery. 3. Arch aortogram 4. Selective catheterization of the right subclavian artery.  SURGEON: Di Kindle. Edilia Bo, MD, FACS  ANESTHESIA: local with sedation   EBL: minimal  TECHNIQUE: The patient was brought to the peripheral vascular lab. Both groins were prepped and draped in the usual sterile fashion. The patient  received 1 mg of Versed and 50 mcg of fentanyl after I placed an IV in the left femoral vein. His was done under ultrasound guidance. On the left side, under ultrasound guidance, after the skin was anesthetized, the left common femoral artery was cannulated and a guidewire introduced into the infrarenal aorta under fluoroscopic control. A long pigtail catheter was positioned in the ascending aortic arch. Flush aortogram was obtained. The pigtail catheter was exchanged for an H1 catheter which was positioned into the innominate artery. A guidewire was advanced into the subclavian artery and the H1 catheter exchange for an endhole catheter. Selective right subclavian arteriogram was obtained.  FINDINGS:  1. Is no significant disease in the arch noted. 2. The subclavian and axillary arteries are widely patent. 3. The bypass graft from the  previous DRIL procedure is patent. The radial and ulnar arteries are patent. 4. The fistula is patent and is quite large. Anastomosis appears to be patent.  Waverly Ferrari, MD, FACS [facility]  DATE OF DICTATION:   11/12/2011

## 2011-11-12 NOTE — Telephone Encounter (Signed)
Message copied by Etheleen Sia on Mon Nov 12, 2011  3:53 PM ------      Message from: Etheleen Sia      Created: Fri Nov 09, 2011  3:08 PM      Regarding: FW: NEW PT       Pt is aware      ----- Message -----         From: Corwin Levins, MD         Sent: 11/08/2011  10:40 AM           To: Etheleen Sia      Subject: RE: NEW PT                                               Please do not re-schedule, as my panel is very full at this time      thanks      ----- Message -----         From: Etheleen Sia         Sent: 11/08/2011   9:43 AM           To: Corwin Levins, MD      Subject: NEW PT                                                   Norma Fredrickson call to reschedule her new pt appt.  She was originally scheduled when you were still taking Medicare patients.  She was a No-Show on March 20, then cancelled her May 7 appt. Then was a no-show on June 5 (all new pt appts with you)  She had also been [facility].  She has cancelled both New appts with Dr. Everardo All.            Do you want [place] to re-schedule her with you?            [phone]

## 2011-11-12 NOTE — Discharge Instructions (Signed)

## 2011-11-12 NOTE — Progress Notes (Signed)
Labs reported to [facility], no new orders received. Informed her also that IV team unable to obtain PIV access. Instructed to send pt to cath lab anyway after labs resulted.

## 2011-11-12 NOTE — Progress Notes (Signed)
Upon inspecting pt's left arm, there was no vein noted for IV start. IV team paged.

## 2011-11-15 ENCOUNTER — Telehealth: Payer: Self-pay

## 2011-11-15 ENCOUNTER — Other Ambulatory Visit: Payer: Self-pay | Admitting: *Deleted

## 2011-11-15 ENCOUNTER — Encounter (HOSPITAL_COMMUNITY): Payer: Self-pay | Admitting: *Deleted

## 2011-11-16 ENCOUNTER — Ambulatory Visit (HOSPITAL_COMMUNITY): Payer: Medicare Other | Admitting: Anesthesiology

## 2011-11-16 ENCOUNTER — Encounter (HOSPITAL_COMMUNITY)
Admission: RE | Disposition: A | Discharge status: Home / Self Care | Payer: Self-pay | Source: Ambulatory Visit | Attending: Vascular Surgery

## 2011-11-16 ENCOUNTER — Encounter (HOSPITAL_COMMUNITY): Payer: Self-pay | Admitting: Anesthesiology

## 2011-11-16 ENCOUNTER — Encounter (HOSPITAL_COMMUNITY): Payer: Self-pay | Admitting: *Deleted

## 2011-11-16 ENCOUNTER — Telehealth: Payer: Self-pay | Admitting: Vascular Surgery

## 2011-11-16 ENCOUNTER — Ambulatory Visit (HOSPITAL_COMMUNITY)
Admission: RE | Admit: 2011-11-16 | Discharge: 2011-11-16 | Disposition: A | Discharge status: Home / Self Care | Payer: Medicare Other | Source: Ambulatory Visit | Attending: Vascular Surgery | Admitting: Vascular Surgery

## 2011-11-16 DIAGNOSIS — F329 Major depressive disorder, single episode, unspecified: Secondary | ICD-10-CM | POA: Insufficient documentation

## 2011-11-16 DIAGNOSIS — Y832 Surgical operation with anastomosis, bypass or graft as the cause of abnormal reaction of the patient, or of later complication, without mention of misadventure at the time of the procedure: Secondary | ICD-10-CM | POA: Insufficient documentation

## 2011-11-16 DIAGNOSIS — T82898A Other specified complication of vascular prosthetic devices, implants and grafts, initial encounter: Secondary | ICD-10-CM

## 2011-11-16 DIAGNOSIS — E109 Type 1 diabetes mellitus without complications: Secondary | ICD-10-CM | POA: Insufficient documentation

## 2011-11-16 DIAGNOSIS — K219 Gastro-esophageal reflux disease without esophagitis: Secondary | ICD-10-CM | POA: Insufficient documentation

## 2011-11-16 DIAGNOSIS — R51 Headache: Secondary | ICD-10-CM | POA: Insufficient documentation

## 2011-11-16 DIAGNOSIS — F3289 Other specified depressive episodes: Secondary | ICD-10-CM | POA: Insufficient documentation

## 2011-11-16 DIAGNOSIS — I120 Hypertensive chronic kidney disease with stage 5 chronic kidney disease or end stage renal disease: Secondary | ICD-10-CM | POA: Insufficient documentation

## 2011-11-16 DIAGNOSIS — N186 End stage renal disease: Secondary | ICD-10-CM

## 2011-11-16 DIAGNOSIS — Z794 Long term (current) use of insulin: Secondary | ICD-10-CM | POA: Insufficient documentation

## 2011-11-16 LAB — POCT I-STAT 4, (NA,K, GLUC, HGB,HCT)
Glucose, Bld: 122 mg/dL — ABNORMAL HIGH (ref 70–99)
HCT: 45 % (ref 36.0–46.0)
Potassium: 6.7 mEq/L (ref 3.5–5.1)
Sodium: 135 mEq/L (ref 135–145)

## 2011-11-16 LAB — SURGICAL PCR SCREEN: MRSA, PCR: NEGATIVE

## 2011-11-16 SURGERY — REVISON OF ARTERIOVENOUS FISTULA
Anesthesia: Monitor Anesthesia Care | Site: Arm Upper | Laterality: Right | Wound class: Clean

## 2011-11-16 MED ORDER — SODIUM CHLORIDE 0.9 % IV SOLN
INTRAVENOUS | Status: DC | PRN
  Administered 2011-11-16: 08:00:00 via INTRAVENOUS

## 2011-11-16 MED ORDER — LIDOCAINE-EPINEPHRINE (PF) 1 %-1:200000 IJ SOLN
INTRAMUSCULAR | Status: DC | PRN
  Administered 2011-11-16: 13.5 mL

## 2011-11-16 MED ORDER — LIDOCAINE HCL (CARDIAC) 20 MG/ML IV SOLN
INTRAVENOUS | Status: DC | PRN
  Administered 2011-11-16: 50 mg via INTRAVENOUS

## 2011-11-16 MED ORDER — SODIUM CHLORIDE 0.9 % IV SOLN: INTRAVENOUS | Status: DC

## 2011-11-16 MED ORDER — PROPOFOL 10 MG/ML IV EMUL
INTRAVENOUS | Status: DC | PRN
  Administered 2011-11-16: 100 ug/kg/min via INTRAVENOUS

## 2011-11-16 MED ORDER — EPHEDRINE SULFATE 50 MG/ML IJ SOLN
INTRAMUSCULAR | Status: DC | PRN
  Administered 2011-11-16: 10 mg via INTRAVENOUS

## 2011-11-16 MED ORDER — ONDANSETRON HCL 4 MG/2ML IJ SOLN: 4.0000 mg | Freq: Four times a day (QID) | INTRAMUSCULAR | Status: DC | PRN

## 2011-11-16 MED ORDER — SODIUM POLYSTYRENE SULFONATE 15 GM/60ML PO SUSP
15.0000 g | Freq: Once | ORAL | Status: DC
  Filled 2011-11-16: qty 60

## 2011-11-16 MED ORDER — SODIUM CHLORIDE 0.9 % IR SOLN
Status: DC | PRN
  Administered 2011-11-16: 09:00:00

## 2011-11-16 MED ORDER — DEXTROSE 5 % IV SOLN
1.5000 g | INTRAVENOUS | Status: DC | PRN
  Administered 2011-11-16: 1.5 g via INTRAVENOUS

## 2011-11-16 MED ORDER — HEPARIN SODIUM (PORCINE) 1000 UNIT/ML IJ SOLN
INTRAMUSCULAR | Status: DC | PRN
  Administered 2011-11-16: 6 mL via INTRAVENOUS

## 2011-11-16 MED ORDER — MUPIROCIN 2 % EX OINT
TOPICAL_OINTMENT | Freq: Two times a day (BID) | CUTANEOUS | Status: DC
  Filled 2011-11-16: qty 22

## 2011-11-16 MED ORDER — OXYCODONE-ACETAMINOPHEN 5-325 MG PO TABS: 1.0000 | ORAL_TABLET | ORAL | Status: AC | PRN

## 2011-11-16 MED ORDER — MIDAZOLAM HCL 5 MG/5ML IJ SOLN
INTRAMUSCULAR | Status: DC | PRN
  Administered 2011-11-16: 2 mg via INTRAVENOUS

## 2011-11-16 MED ORDER — MUPIROCIN 2 % EX OINT
TOPICAL_OINTMENT | CUTANEOUS | Status: AC
  Administered 2011-11-16: 1 via NASAL
  Filled 2011-11-16: qty 22

## 2011-11-16 MED ORDER — LIDOCAINE HCL (PF) 1 % IJ SOLN
INTRAMUSCULAR | Status: AC
  Filled 2011-11-16: qty 30

## 2011-11-16 MED ORDER — PROTAMINE SULFATE 10 MG/ML IV SOLN
INTRAVENOUS | Status: DC | PRN
  Administered 2011-11-16: 40 mg via INTRAVENOUS

## 2011-11-16 MED ORDER — FENTANYL CITRATE 0.05 MG/ML IJ SOLN
INTRAMUSCULAR | Status: AC
  Filled 2011-11-16: qty 2

## 2011-11-16 MED ORDER — 0.9 % SODIUM CHLORIDE (POUR BTL) OPTIME
TOPICAL | Status: DC | PRN
  Administered 2011-11-16: 1000 mL

## 2011-11-16 MED ORDER — DEXTROSE 5 % IV SOLN
1.5000 g | INTRAVENOUS | Status: DC
  Filled 2011-11-16: qty 1.5

## 2011-11-16 MED ORDER — FENTANYL CITRATE 0.05 MG/ML IJ SOLN
INTRAMUSCULAR | Status: DC | PRN
  Administered 2011-11-16 (×3): 50 ug via INTRAVENOUS

## 2011-11-16 MED ORDER — FENTANYL CITRATE 0.05 MG/ML IJ SOLN
25.0000 ug | INTRAMUSCULAR | Status: DC | PRN
  Administered 2011-11-16 (×4): 25 ug via INTRAVENOUS

## 2011-11-16 MED ORDER — PROPOFOL 10 MG/ML IV BOLUS
INTRAVENOUS | Status: DC | PRN
  Administered 2011-11-16 (×2): 20 mg via INTRAVENOUS

## 2011-11-16 SURGICAL SUPPLY — 38 items
CANISTER SUCTION 2500CC (MISCELLANEOUS) ×2 IMPLANT
CLOTH BEACON ORANGE TIMEOUT ST (SAFETY) ×2 IMPLANT
COVER SURGICAL LIGHT HANDLE (MISCELLANEOUS) ×4 IMPLANT
DERMABOND ADVANCED (GAUZE/BANDAGES/DRESSINGS) ×1
DERMABOND ADVANCED .7 DNX12 (GAUZE/BANDAGES/DRESSINGS) ×1 IMPLANT
ELECT REM PT RETURN 9FT ADLT (ELECTROSURGICAL) ×2
ELECTRODE REM PT RTRN 9FT ADLT (ELECTROSURGICAL) ×1 IMPLANT
GEL ULTRASOUND 20GR AQUASONIC (MISCELLANEOUS) ×2 IMPLANT
GLOVE BIO SURGEON STRL SZ 6.5 (GLOVE) ×2 IMPLANT
GLOVE BIO SURGEON STRL SZ7.5 (GLOVE) ×2 IMPLANT
GLOVE BIOGEL PI IND STRL 6.5 (GLOVE) ×2 IMPLANT
GLOVE BIOGEL PI IND STRL 7.0 (GLOVE) ×2 IMPLANT
GLOVE BIOGEL PI IND STRL 7.5 (GLOVE) ×3 IMPLANT
GLOVE BIOGEL PI INDICATOR 6.5 (GLOVE) ×2
GLOVE BIOGEL PI INDICATOR 7.0 (GLOVE) ×2
GLOVE BIOGEL PI INDICATOR 7.5 (GLOVE) ×3
GLOVE ECLIPSE 7.0 STRL STRAW (GLOVE) ×2 IMPLANT
GLOVE SURG SS PI 7.5 STRL IVOR (GLOVE) ×4 IMPLANT
GOWN STRL NON-REIN LRG LVL3 (GOWN DISPOSABLE) ×8 IMPLANT
KIT BASIN OR (CUSTOM PROCEDURE TRAY) ×2 IMPLANT
KIT ROOM TURNOVER OR (KITS) ×2 IMPLANT
NS IRRIG 1000ML POUR BTL (IV SOLUTION) ×2 IMPLANT
PACK CV ACCESS (CUSTOM PROCEDURE TRAY) ×2 IMPLANT
PAD ARMBOARD 7.5X6 YLW CONV (MISCELLANEOUS) ×4 IMPLANT
SPONGE GAUZE 4X4 12PLY (GAUZE/BANDAGES/DRESSINGS) ×2 IMPLANT
SPONGE SURGIFOAM ABS GEL 100 (HEMOSTASIS) IMPLANT
SUT ETHILON 3 0 PS 1 (SUTURE) IMPLANT
SUT PROLENE 6 0 BV (SUTURE) ×4 IMPLANT
SUT SILK 0 TIES 10X30 (SUTURE) ×2 IMPLANT
SUT VIC AB 3-0 SH 27 (SUTURE) ×1
SUT VIC AB 3-0 SH 27X BRD (SUTURE) ×1 IMPLANT
SUT VICRYL 4-0 PS2 18IN ABS (SUTURE) ×2 IMPLANT
SWAB COLLECTION DEVICE MRSA (MISCELLANEOUS) IMPLANT
TOWEL OR 17X24 6PK STRL BLUE (TOWEL DISPOSABLE) ×2 IMPLANT
TOWEL OR 17X26 10 PK STRL BLUE (TOWEL DISPOSABLE) ×2 IMPLANT
TUBE ANAEROBIC SPECIMEN COL (MISCELLANEOUS) IMPLANT
UNDERPAD 30X30 INCONTINENT (UNDERPADS AND DIAPERS) ×2 IMPLANT
WATER STERILE IRR 1000ML POUR (IV SOLUTION) ×2 IMPLANT

## 2011-11-16 NOTE — Progress Notes (Signed)
Dr. Drue Stager notified that lab unable to get blood, will get in OR.

## 2011-11-16 NOTE — Op Note (Signed)
NAME: [name]   MRN: [id number] DOB: 04-15-1971    DATE OF OPERATION: 11/16/2011  PREOP DIAGNOSIS: steal syndrome right upper extremity  POSTOP DIAGNOSIS: same  PROCEDURE: revision of right basilic vein transposition  ( resection of redundant segment and plication of the proximal fistula)  SURGEON: Di Kindle. Edilia Bo, MD, FACS  ASSIST: Della Goo PA  ANESTHESIA: local with sedation   EBL: minimal  INDICATIONS: KEVYN WENGERT is a 41 y.o. female undergone a previous right basilic vein transposition. She later developed a steal syndrome and underwent a DRIL procedure which resolved her symptoms. However, she has gradually developed a recurrent steal syndrome in her right upper extremity. She was being evaluated for placement in access which would likely involve a thigh graft, however we elected to try to narrow the proximal fistula in order to resolve her symptoms and still salvage her fistula.  FINDINGS: the proximal fistula was redundant and this was revised. The brachial artery to brachial artery bypass was widely patent.  TECHNIQUE: The patient was brought to the operating room and sedated by anesthesia. The right upper extremity was prepped and draped in the usual sterile fashion. An incision in the proximal fistula was made after the skin was anesthetized. I dissected free the proximal fistula down to where it was anastomosed end to side to the brachial artery. The brachial artery to brachial artery bypass was also dissected free. The proximal fistula was markedly redundant. I felt it would be difficult to and the fistula or plicated without causing significant kinking and resulting in a graft thrombosis. I therefore elected to resect the redundant segment. The patient was heparinized. The vein was clamped proximally and distally and an approximate 1-1/2 cm segment of vein was excised. The vein was then sewn back in the end with continuous 6-0 Prolene suture. I did not think  that this revision significantly effected for the fistula. For this reason I elected to plicate the proximal fistula in order to try to narrow was somewhat but still preserve the fistula while at the same time improve inflow distally. Using a 6-0 Prolene I narrowed the proximal fistula with a running suture to limit flow to the fistula. At the completion was a biphasic radial and ulnar signal with the Doppler and good flow was still remaining in the fistula. I did not think it would be possible to narrow this fistula any further without resulting in graft thrombosis inadequate flow in the fistula for dialysis. The heparin was partially reversed with protamine. The wound was closed with 2 deep layers of 3-0 Vicryl and the skin closed with 4-0 Vicryl. Dermabond was applied. Patient tolerated the procedure well and was transferred to the recovery room in stable condition. All needle and sponge counts were correct.  Waverly Ferrari, MD, FACS [facility]  DATE OF DICTATION:   11/16/2011

## 2011-11-16 NOTE — Progress Notes (Signed)
Patient given instructions to use Mupirocin once tonight and then bid for 4 days for positive staff PCR

## 2011-11-16 NOTE — Discharge Instructions (Addendum)
Instructions Following General Anesthetic, Adult A nurse specialized in giving anesthesia (anesthetist) or a doctor specialized in giving anesthesia (anesthesiologist) gave you a medicine that made you sleep while a procedure was performed. For as long as 24 hours following this procedure, you may feel:  Dizzy.   Weak.   Drowsy.  AFTER THE PROCEDURE After surgery, you will be taken to the recovery area where a nurse will monitor your progress. You will be allowed to go home when you are awake, stable, taking fluids well, and without complications. For the first 24 hours following an anesthetic:  Have a responsible person with you.   Do not drive a car. If you are alone, do not take public transportation.   Do not drink alcohol.   Do not take medicine that has not been prescribed by your caregiver.   Do not sign important papers or make important decisions.   You may resume normal diet and activities as directed.   Change bandages (dressings) as directed.   Only take over-the-counter or prescription medicines for pain, discomfort, or fever as directed by your caregiver.  If you have questions or problems that seem related to the anesthetic, call the hospital and ask for the anesthetist or anesthesiologist on call. SEEK IMMEDIATE MEDICAL CARE IF:   You develop a rash.   You have difficulty breathing.   You have chest pain.   You develop any allergic problems.  Document Released: 08/20/2000 Document Revised: 05/03/2011 Document Reviewed: 03/31/2007 [employer] Patient Information 2012 [employer]. 

## 2011-11-16 NOTE — Interval H&P Note (Signed)
History and Physical Interval Note:  11/16/2011 7:18 AM  [name]  has presented today for surgery, with the diagnosis of ESRD  The various methods of treatment have been discussed with the patient and family. After consideration of risks, benefits and other options for treatment, the patient has consented to: BANDING OF ARTERIOVENOUS  FISTULA (Right) as a surgical intervention .  The patient's history has been reviewed, patient examined, no change in status, stable for surgery.  I have reviewed the patients' chart and labs.  Questions were answered to the patient's satisfaction.     Tameka Hoiland S

## 2011-11-16 NOTE — Anesthesia Postprocedure Evaluation (Signed)
Anesthesia Post Note  Patient: [name]  Procedure(s) Performed: Procedure(s) (LRB): REVISON OF ARTERIOVENOUS FISTULA (Right)  Anesthesia type: General  Patient location: PACU  Post pain: Pain level controlled and Adequate analgesia  Post assessment: Post-op Vital signs reviewed, Patient's Cardiovascular Status Stable, Respiratory Function Stable, Patent Airway and Pain level controlled  Last Vitals:  Filed Vitals:   11/16/11 1026  BP:   Pulse: 71  Temp:   Resp: 14    Post vital signs: Reviewed and stable  Level of consciousness: awake, alert  and oriented  Complications: No apparent anesthesia complications

## 2011-11-16 NOTE — Progress Notes (Signed)
Patient picked up prescription for pain medication instructions given on use of pain medication

## 2011-11-16 NOTE — Progress Notes (Signed)
Patient notified that prescription for pain medication had been left at hospital. Informed patient that she could have a family member come and pick it up or take the pain medication she had at home. Patient stated she would have her sister come and get the prescription.

## 2011-11-16 NOTE — H&P (View-Only) (Signed)
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 female who has a known left subclavian vein occlusion. Her most recent access was a right basilic vein transposition. She ultimately developed steal and in August of 2011 underwent a DRIL procedure which resolved her symptoms. However she was evaluated on 10/16/2011 with evidence of recurrent steal symptoms. She was being considered for a thigh graft and asked to discuss this further.  She has fairly constant pain in the right hand. He does not have any history of carpal tunnel syndrome and does not use her hand for repetitive tasks. The fistula has been working well.  She's had no recent uremic symptoms. Specifically she denies nausea, vomiting, palpitations, fatigue, or anorexia.   REVIEW OF SYSTEMS: Arly.Keller ] denotes positive finding; [  ] denotes negative finding  CARDIOVASCULAR:  [ ]  chest pain   [ ]  dyspnea on exertion    CONSTITUTIONAL:  [ ]  fever   [ ]  chills  PHYSICAL EXAM: Filed Vitals:   10/31/11 1356  BP: 177/93  Pulse: 75  Temp: 97.9 F (36.6 C)  TempSrc: Oral  Height: 5\' 1"  (1.549 m)  Weight: 169 lb (76.658 kg)   Body mass index is 31.93 kg/(m^2). GENERAL: The patient is a well-nourished female, in no acute distress. The vital signs are documented above. CARDIOVASCULAR: There is a regular rate and rhythm  PULMONARY: There is good air exchange bilaterally without wheezing or rales. Her right upper arm fistula has an excellent thrill. As a palpable radial pulse. She has a good radial, ulnar, and palm or arch signal with the Doppler. These do augment with compression of her fistula.  MEDICAL ISSUES: I have recommended that we perform a right upper extremity arteriogram in order to evaluate her fistula, bypass in the right arm, and native arteries.  Potentially we'll find something that could be addressed to help resolve her pain in the right hand and the same time salvage her fistula. Her arteriogram is scheduled for 11/12/2011. We'll make further recommendations pending these results. We have discussed the procedure potential complications and she is agreeable to proceed.  Kodey Xue S [facility] [place] Beeper: [phone]

## 2011-11-16 NOTE — Telephone Encounter (Signed)
Message copied by Fredrich Birks on Fri Nov 16, 2011  2:17 PM ------      Message from: Melene Plan      Created: Fri Nov 16, 2011 11:56 AM      Regarding: FW: charge and F/U                   ----- Message -----         From: Chuck Hint, MD         Sent: 11/16/2011  10:00 AM           To: Reuel Derby, Melene Plan, RN      Subject: charge and F/U                                           This patient had revision of her right basilic vein transposition. I resected the redundant segment and then plicated the proximal fistula. Asst. Was Della Goo. I would see her in follow up in 2-3 weeks.      Thank you      CSD

## 2011-11-16 NOTE — Anesthesia Preprocedure Evaluation (Addendum)
Anesthesia Evaluation  Patient identified by MRN, date of birth, ID band Patient awake    Reviewed: Allergy & Precautions, H&P , NPO status   History of Anesthesia Complications Negative for: history of anesthetic complications  Airway Mallampati: II  Neck ROM: full    Dental  (+) Teeth Intact and Dental Advisory Given   Pulmonary pneumonia ,          Cardiovascular hypertension, Pt. on home beta blockers and Pt. on medications + angina     Neuro/Psych  Headaches, Depression    GI/Hepatic Neg liver ROS, GERD-  Medicated and Controlled,  Endo/Other  Diabetes mellitus-, Well Controlled, Type 1, Insulin Dependent  Renal/GU ESRF and DialysisRenal disease     Musculoskeletal negative musculoskeletal ROS (+)   Abdominal   Peds  Hematology negative hematology ROS (+)   Anesthesia Other Findings   Reproductive/Obstetrics negative OB ROS                         Anesthesia Physical Anesthesia Plan  ASA: III  Anesthesia Plan: MAC   Post-op Pain Management:    Induction: Intravenous  Airway Management Planned: Simple Face Mask  Additional Equipment:   Intra-op Plan:   Post-operative Plan:   Informed Consent: I have reviewed the patients History and Physical, chart, labs and discussed the procedure including the risks, benefits and alternatives for the proposed anesthesia with the patient or authorized representative who has indicated his/her understanding and acceptance.     Plan Discussed with: CRNA and Surgeon  Anesthesia Plan Comments:         Anesthesia Quick Evaluation

## 2011-11-16 NOTE — Telephone Encounter (Signed)
Patient notified, dpm

## 2011-11-16 NOTE — Transfer of Care (Signed)
Immediate Anesthesia Transfer of Care Note  Patient: [name]  Procedure(s) Performed: Procedure(s) (LRB): REVISON OF ARTERIOVENOUS FISTULA (Right)  Patient Location: PACU  Anesthesia Type: MAC  Level of Consciousness: awake, alert  and oriented  Airway & Oxygen Therapy: Patient Spontanous Breathing  Post-op Assessment: Report given to PACU RN and Post -op Vital signs reviewed and stable  Post vital signs: Reviewed and stable  Complications: No apparent anesthesia complications

## 2011-11-16 NOTE — Preoperative (Signed)
Beta Blockers   Reason not to administer Beta Blockers:Not Applicable 

## 2011-11-23 ENCOUNTER — Other Ambulatory Visit: Payer: Self-pay | Admitting: *Deleted

## 2011-11-23 DIAGNOSIS — Z22322 Carrier or suspected carrier of Methicillin resistant Staphylococcus aureus: Secondary | ICD-10-CM

## 2011-11-23 MED ORDER — MUPIROCIN 2 % EX OINT: TOPICAL_OINTMENT | Freq: Two times a day (BID) | CUTANEOUS | Status: AC

## 2011-11-26 ENCOUNTER — Encounter (INDEPENDENT_AMBULATORY_CARE_PROVIDER_SITE_OTHER): Payer: Medicare Other | Admitting: Ophthalmology

## 2011-11-27 ENCOUNTER — Ambulatory Visit: Payer: Medicare Other | Admitting: Neurology

## 2011-11-28 ENCOUNTER — Encounter: Payer: Medicare Other | Admitting: Internal Medicine

## 2011-11-28 ENCOUNTER — Emergency Department (INDEPENDENT_AMBULATORY_CARE_PROVIDER_SITE_OTHER)
Admission: EM | Admit: 2011-11-28 | Discharge: 2011-11-28 | Disposition: A | Discharge status: Home / Self Care | Payer: Medicare Other | Source: Home / Self Care | Attending: Family Medicine | Admitting: Family Medicine

## 2011-11-28 ENCOUNTER — Encounter (HOSPITAL_COMMUNITY): Payer: Self-pay | Admitting: *Deleted

## 2011-11-28 DIAGNOSIS — J309 Allergic rhinitis, unspecified: Secondary | ICD-10-CM

## 2011-11-28 MED ORDER — AZITHROMYCIN 250 MG PO TABS: 250.0000 mg | ORAL_TABLET | Freq: Every day | ORAL | Status: AC

## 2011-11-28 MED ORDER — FLUTICASONE PROPIONATE 50 MCG/ACT NA SUSP: 2.0000 | Freq: Every day | NASAL | Status: DC

## 2011-11-28 MED ORDER — CETIRIZINE HCL 10 MG PO CAPS: 1.0000 | ORAL_CAPSULE | Freq: Every evening | ORAL | Status: DC | PRN

## 2011-11-28 NOTE — ED Notes (Signed)
Pt  Reports  Symptoms  Of  Cough  /  Congestion     Stuffy  Nose       And  Headache  Symptoms    X  3  Days   -  Pt  Is  Renal  Dialysis   Has  A  Shunt in  r  Arm        She  Is  Sitting  Upright on  Exam table  Speaking in  Complete  sentances

## 2011-11-28 NOTE — ED Provider Notes (Signed)
History     CSN: [id number]  Arrival date & time 11/28/11  1308   First MD Initiated Contact with Patient 11/28/11 1335      Chief Complaint  Patient presents with  . 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 Here complaining of nasal congestion, sinus pressure and sneezing stuffy nose, headache and rhinorrhea for 3 days. She takes daily prednisone 5 mg due to transplant. Not taking any allergy medications currently. Has used Flonase in the past but does not have refills. Worried about developing sinusitis as she also has a history of recurrent sinus infections. Denies fever or chills. Good appetite. No nausea vomiting. No productive cough, shortness of breath, wheezing or chest pain.   Past Medical History  Diagnosis Date  . Hyperlipidemia   . Alopecia   . Obesity   . Iron deficiency   . 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 RLS (restless legs syndrome)   . Respiratory failure   . Injury of conjunctiva and corneal abrasion of right eye without foreign body   . Cellulitis   . Dialysis patient     Tues; Thurs; Sat; [place]  . Angina   . 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 Anemia   . Pneumonia 2012    "double"  . Blood transfusion 2004    "when I had my transplant"  . Migraine   . 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 Hyperparathyroidism, secondary   . Diabetic retinopathy   . Hypertension     takes Metoprolol and Exforge daily  . Depression     takes Klonopin nightly  . Diabetes mellitus type 1     Pt states diagnosed at age 89 with prior episodes of DKA. S/p pancreatic transplant   . GERD  (gastroesophageal reflux disease)     takes Protonix daily    Past Surgical History  Procedure Date  . 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 Thyroglobulin     x 7  . Av fistula placement     right upper arm  . Cholecystectomy 1995  .  hd graft placement/removal     "had 2 in my left upper arm"  . Eye surgery   . Retinopathy surgery   . Tooth extraction 10/10/11    X's two    History reviewed. No pertinent family history.  History  Substance Use Topics  . Smoking status: Never Smoker   . Smokeless tobacco: Never Used  . Alcohol Use: No    OB History    Grav Para Term Preterm Abortions TAB SAB Ect Mult Living                  Review of Systems  Constitutional: Negative for fever, chills and appetite change.       10 systems reviewed and  pertinent negative and positive symptoms are as per HPI.     HENT: Positive for congestion, rhinorrhea, sneezing and sinus pressure. Negative for sore throat, neck pain and neck stiffness.   Respiratory: Positive for cough. Negative for shortness of breath and wheezing.        Non productive cough  Cardiovascular: Negative for chest pain and leg  swelling.  Gastrointestinal: Negative for nausea and vomiting.  Neurological: Positive for headaches.  All other systems reviewed and are negative.    Allergies  Morphine and related; Flagyl; and Tramadol  Home Medications   Current Outpatient Rx  Name Route Sig Dispense Refill  . ALBUTEROL SULFATE HFA 108 (90 BASE) MCG/ACT IN AERS Inhalation Inhale 2 puffs into the lungs every 4 (four) hours as needed. For wheezing.     Marland Kitchen AMLODIPINE BESYLATE-VALSARTAN 10-160 MG PO TABS Oral Take 1 tablet by mouth daily.    . ASPIRIN EC 81 MG PO TBEC Oral Take 81 mg by mouth daily.    . AZITHROMYCIN 250 MG PO TABS Oral Take 1 tablet (250 mg total) by mouth daily. Take first 2 tablets together, then 1 every day until finished. 6 tablet 0  . CALCIUM ACETATE 667 MG PO  CAPS Oral Take 1,334-2,001 mg by mouth 3 (three) times daily with meals. Dosage depends on meal consumed    . CETIRIZINE HCL 10 MG PO CAPS Oral Take 1 capsule (10 mg total) by mouth at bedtime as needed. 30 capsule 0  . CLONAZEPAM 0.5 MG PO TABS Oral Take 2 mg by mouth at bedtime.     Marland Kitchen FLUTICASONE PROPIONATE 50 MCG/ACT NA SUSP Nasal Place 2 sprays into the nose daily. 16 g 0  . INSULIN ASPART 100 UNIT/ML [place] SOLN Subcutaneous Inject 5-8 Units into the skin 3 (three) times daily before meals. Inject 3 units three times daily, in addition to sliding scale.    . INSULIN GLARGINE 100 UNIT/ML [place] SOLN Subcutaneous Inject 7 Units into the skin 2 (two) times daily.    Marland Kitchen METOPROLOL SUCCINATE ER 100 MG PO TB24 Oral Take 100 mg by mouth 4 (four) times a week. Do not take the night before dialysis. Dialysis on Tues, Thurs & Sat.    . MUPIROCIN 2 % EX OINT Topical Apply topically 2 (two) times daily. 22 g 0  . MYCOPHENOLATE MOFETIL 250 MG PO CAPS Oral Take 250 mg by mouth 2 (two) times daily.     Marland Kitchen PANTOPRAZOLE SODIUM 40 MG PO TBEC Oral Take 40 mg by mouth daily.    Marland Kitchen PREDNISONE 5 MG PO TABS Oral Take 1 tablet by mouth daily.    Marland Kitchen PROMETHAZINE HCL 25 MG PO TABS Oral Take 25 mg by mouth every 8 (eight) hours as needed. For nausea.    Marland Kitchen TACROLIMUS 1 MG PO CAPS Oral Take 1 mg by mouth 2 (two) times daily.     Marland Kitchen ZOLPIDEM TARTRATE 10 MG PO TABS Oral Take 10 mg by mouth at bedtime as needed. For insomnia.      BP 166/77  Pulse 80  Temp 98.2 F (36.8 C) (Oral)  Resp 18  SpO2 98%  LMP 11/14/2011  Physical Exam  Nursing note and vitals reviewed. Constitutional: She is oriented to person, place, and time. She appears well-developed and well-nourished. No distress.  HENT:  Head: Normocephalic and atraumatic.  Right Ear: External ear normal.  Left Ear: External ear normal.       Nasal Congestion with erythema and swelling of nasal turbinates, clear rhinorrhea. Reported sinus discomfort with palpation. No  pharyngeal erythema, no exudates. No uvula deviation. No trismus. TM's normal   Eyes: Conjunctivae and EOM are normal. Pupils are equal, round, and reactive to light. Right eye exhibits no discharge. Left eye exhibits no discharge.  Neck: Normal range of motion. Neck supple. No thyromegaly present.  Cardiovascular: Normal rate,  regular rhythm and normal heart sounds.   Pulmonary/Chest: Effort normal and breath sounds normal. No respiratory distress. She has no wheezes. She has no rales.  Lymphadenopathy:    She has no cervical adenopathy.  Neurological: She is alert and oriented to person, place, and time.  Skin: No rash noted.    ED Course  Procedures (including critical care time)  Labs Reviewed - No data to display No results found.   1. Allergic rhinosinusitis       MDM  Impress flareup of allergic rhinitis treated symptomatically with Zyrtec and saline spray. Prescribed Flonase. Provided a hold prescription for a azithromycin to start if persistent or worsening symptoms. Referral to ENT as needed.  Sharin Grave, MD 11/29/11 [phone]

## 2011-11-28 NOTE — Progress Notes (Signed)
 This encounter was created in error - please disregard.

## 2011-12-04 ENCOUNTER — Encounter: Payer: Self-pay | Admitting: Vascular Surgery

## 2011-12-05 ENCOUNTER — Ambulatory Visit (INDEPENDENT_AMBULATORY_CARE_PROVIDER_SITE_OTHER): Payer: Medicare Other | Admitting: Vascular Surgery

## 2011-12-05 ENCOUNTER — Encounter: Payer: Self-pay | Admitting: Vascular Surgery

## 2011-12-05 VITALS — BP 177/91 | HR 89 | Temp 97.8°F | Ht 61.0 in | Wt 166.0 lb

## 2011-12-05 DIAGNOSIS — N186 End stage renal disease: Secondary | ICD-10-CM

## 2011-12-05 DIAGNOSIS — T82898A Other specified complication of vascular prosthetic devices, implants and grafts, initial encounter: Secondary | ICD-10-CM

## 2011-12-05 MED ORDER — OXYCODONE-ACETAMINOPHEN 5-325 MG PO TABS: 1.0000 | ORAL_TABLET | ORAL | Status: DC | PRN

## 2011-12-05 NOTE — Progress Notes (Signed)
[facility] of [facility]  Patient name: [name] MRN: [id number] DOB: [birth year] Sex: female  REASON FOR VISIT: follow up after recent  Revision of right upper arm fistula.  HPI: [name] is a 41 y.o. female who has a complicated access history. She had a previous right basilic vein transposition. She subsequently developed steal syndrome. She underwent a DRIL procedure which resolved her symptoms but then subsequently developed recurrent steal. Most recently I resected redundant segment of her fistula and plicated the proximal fistula to try to limit blood flow in the fistula and improve her symptoms. He comes in for follow up visit. She states her symptoms have not improved significantly and are no longer tolerable. She wishes to have the fistula ligated.  Previous vein mapping showed no other options on the right for a fistula.   REVIEW OF SYSTEMS: Arly.Keller ] denotes positive finding; [  ] denotes negative finding  CARDIOVASCULAR:  [ ]  chest pain   [ ]  dyspnea on exertion    CONSTITUTIONAL:  [ ]  fever   [ ]  chills  PHYSICAL EXAM: Filed Vitals:   12/05/11 0936  BP: 177/91  Pulse: 89  Temp: 97.8 F (36.6 C)  TempSrc: Oral  Height: 5\' 1"  (1.549 m)  Weight: 166 lb (75.297 kg)  SpO2: 99%   Body mass index is 31.37 kg/(m^2). GENERAL: The patient is a well-nourished female, in no acute distress. The vital signs are documented above. CARDIOVASCULAR: There is a regular rate and rhythm  PULMONARY: There is good air exchange bilaterally without wheezing or rales. Her right upper arm fistula has an excellent thrill. Her symptoms in her hand to improve when I compressed her fistula.  MEDICAL ISSUES: Is patient has persistent steal symptoms despite attempted revision. I see no other option given the progression of her symptoms but to ligate her fistula and place a catheter. We will then have her return to evaluate her for new access hopefully in the left arm if her  right arm has improved. Her surgery is scheduled for Friday, 12/07/2011. I have given her prescription for 20 oxycodone for pain until then.Marland Kitchen  Aristides Luckey S [facility] [place] Beeper: [phone]

## 2011-12-06 ENCOUNTER — Other Ambulatory Visit: Payer: Self-pay

## 2011-12-06 ENCOUNTER — Encounter (HOSPITAL_COMMUNITY): Payer: Self-pay | Admitting: *Deleted

## 2011-12-06 MED ORDER — CEFAZOLIN SODIUM-DEXTROSE 2-3 GM-% IV SOLR
2.0000 g | INTRAVENOUS | Status: DC
  Filled 2011-12-06: qty 50

## 2011-12-06 NOTE — Progress Notes (Signed)
Contacted Dr. Adele Dan office, spoke with Okey Regal, requested preop orders.

## 2011-12-07 ENCOUNTER — Encounter (HOSPITAL_COMMUNITY)
Admission: RE | Disposition: A | Discharge status: Other Acute Inpatient Hospital | Payer: Self-pay | Source: Ambulatory Visit | Attending: Vascular Surgery

## 2011-12-07 ENCOUNTER — Encounter (HOSPITAL_COMMUNITY): Payer: Self-pay

## 2011-12-07 ENCOUNTER — Emergency Department (HOSPITAL_COMMUNITY)
Admission: EM | Admit: 2011-12-07 | Discharge: 2011-12-07 | Disposition: A | Discharge status: Home / Self Care | Payer: Medicare Other | Attending: Emergency Medicine | Admitting: Emergency Medicine

## 2011-12-07 ENCOUNTER — Other Ambulatory Visit: Payer: Self-pay

## 2011-12-07 ENCOUNTER — Ambulatory Visit (HOSPITAL_COMMUNITY)
Admission: RE | Admit: 2011-12-07 | Discharge: 2011-12-07 | Payer: Medicare Other | Source: Ambulatory Visit | Attending: Vascular Surgery | Admitting: Vascular Surgery

## 2011-12-07 ENCOUNTER — Telehealth (HOSPITAL_COMMUNITY): Payer: Self-pay | Admitting: *Deleted

## 2011-12-07 DIAGNOSIS — K219 Gastro-esophageal reflux disease without esophagitis: Secondary | ICD-10-CM | POA: Insufficient documentation

## 2011-12-07 DIAGNOSIS — F329 Major depressive disorder, single episode, unspecified: Secondary | ICD-10-CM | POA: Insufficient documentation

## 2011-12-07 DIAGNOSIS — E162 Hypoglycemia, unspecified: Secondary | ICD-10-CM

## 2011-12-07 DIAGNOSIS — G2581 Restless legs syndrome: Secondary | ICD-10-CM | POA: Insufficient documentation

## 2011-12-07 DIAGNOSIS — Z992 Dependence on renal dialysis: Secondary | ICD-10-CM | POA: Insufficient documentation

## 2011-12-07 DIAGNOSIS — I120 Hypertensive chronic kidney disease with stage 5 chronic kidney disease or end stage renal disease: Secondary | ICD-10-CM | POA: Insufficient documentation

## 2011-12-07 DIAGNOSIS — T82898A Other specified complication of vascular prosthetic devices, implants and grafts, initial encounter: Secondary | ICD-10-CM | POA: Insufficient documentation

## 2011-12-07 DIAGNOSIS — Y832 Surgical operation with anastomosis, bypass or graft as the cause of abnormal reaction of the patient, or of later complication, without mention of misadventure at the time of the procedure: Secondary | ICD-10-CM | POA: Insufficient documentation

## 2011-12-07 DIAGNOSIS — R51 Headache: Secondary | ICD-10-CM | POA: Insufficient documentation

## 2011-12-07 DIAGNOSIS — F3289 Other specified depressive episodes: Secondary | ICD-10-CM | POA: Insufficient documentation

## 2011-12-07 DIAGNOSIS — N186 End stage renal disease: Secondary | ICD-10-CM | POA: Insufficient documentation

## 2011-12-07 DIAGNOSIS — Z794 Long term (current) use of insulin: Secondary | ICD-10-CM | POA: Insufficient documentation

## 2011-12-07 DIAGNOSIS — E1069 Type 1 diabetes mellitus with other specified complication: Secondary | ICD-10-CM | POA: Insufficient documentation

## 2011-12-07 DIAGNOSIS — E785 Hyperlipidemia, unspecified: Secondary | ICD-10-CM | POA: Insufficient documentation

## 2011-12-07 DIAGNOSIS — Z5309 Procedure and treatment not carried out because of other contraindication: Secondary | ICD-10-CM | POA: Insufficient documentation

## 2011-12-07 DIAGNOSIS — D509 Iron deficiency anemia, unspecified: Secondary | ICD-10-CM | POA: Insufficient documentation

## 2011-12-07 LAB — GLUCOSE, CAPILLARY: Glucose-Capillary: 141 mg/dL — ABNORMAL HIGH (ref 70–99)

## 2011-12-07 SURGERY — LIGATION OF ARTERIOVENOUS  FISTULA
Anesthesia: Monitor Anesthesia Care | Site: Neck | Laterality: Right

## 2011-12-07 MED ORDER — OXYCODONE-ACETAMINOPHEN 5-325 MG PO TABS: 1.0000 | ORAL_TABLET | ORAL | Status: DC | PRN

## 2011-12-07 MED ORDER — DEXTROSE 50 % IV SOLN
25.0000 mL | Freq: Once | INTRAVENOUS | Status: AC
  Administered 2011-12-07: 25 mL via INTRAVENOUS
  Filled 2011-12-07: qty 50

## 2011-12-07 MED ORDER — DEXTROSE 50 % IV SOLN
25.0000 mL | Freq: Once | INTRAVENOUS | Status: DC
Start: 2011-12-07 — End: 2011-12-07

## 2011-12-07 MED ORDER — GLUCOSE 40 % PO GEL
ORAL | Status: AC
  Administered 2011-12-07: 37.5 g via ORAL
  Filled 2011-12-07: qty 1

## 2011-12-07 MED ORDER — OXYCODONE-ACETAMINOPHEN 5-325 MG PO TABS
1.0000 | ORAL_TABLET | Freq: Once | ORAL | Status: AC
  Administered 2011-12-07: 1 via ORAL
  Filled 2011-12-07: qty 1

## 2011-12-07 MED ORDER — DEXTROSE 50 % IV SOLN
INTRAVENOUS | Status: AC
  Administered 2011-12-07: 25 mL via INTRAVENOUS
  Filled 2011-12-07: qty 50

## 2011-12-07 MED ORDER — GLUCOSE 40 % PO GEL
1.0000 | Freq: Once | ORAL | Status: AC
  Administered 2011-12-07: 37.5 g via ORAL

## 2011-12-07 MED ORDER — SODIUM CHLORIDE 0.9 % IV SOLN
INTRAVENOUS | Status: DC
  Administered 2011-12-07: 11:00:00 via INTRAVENOUS

## 2011-12-07 SURGICAL SUPPLY — 53 items
BAG DECANTER FOR FLEXI CONT (MISCELLANEOUS) ×3 IMPLANT
CANISTER SUCTION 2500CC (MISCELLANEOUS) ×3 IMPLANT
CATH CANNON HEMO 15F 50CM (CATHETERS) IMPLANT
CATH CANNON HEMO 15FR 19 (HEMODIALYSIS SUPPLIES) IMPLANT
CATH CANNON HEMO 15FR 23CM (HEMODIALYSIS SUPPLIES) IMPLANT
CATH CANNON HEMO 15FR 31CM (HEMODIALYSIS SUPPLIES) IMPLANT
CATH CANNON HEMO 15FR 32CM (HEMODIALYSIS SUPPLIES) IMPLANT
CHLORAPREP W/TINT 26ML (MISCELLANEOUS) ×3 IMPLANT
CLOTH BEACON ORANGE TIMEOUT ST (SAFETY) ×3 IMPLANT
COVER PROBE W GEL 5X96 (DRAPES) IMPLANT
COVER SURGICAL LIGHT HANDLE (MISCELLANEOUS) ×6 IMPLANT
DERMABOND ADVANCED (GAUZE/BANDAGES/DRESSINGS) ×1
DERMABOND ADVANCED .7 DNX12 (GAUZE/BANDAGES/DRESSINGS) ×2 IMPLANT
DRAPE C-ARM 42X72 X-RAY (DRAPES) ×3 IMPLANT
DRAPE CHEST BREAST 15X10 FENES (DRAPES) ×3 IMPLANT
ELECT REM PT RETURN 9FT ADLT (ELECTROSURGICAL) ×3
ELECTRODE REM PT RTRN 9FT ADLT (ELECTROSURGICAL) ×2 IMPLANT
GAUZE SPONGE 2X2 8PLY STRL LF (GAUZE/BANDAGES/DRESSINGS) ×2 IMPLANT
GAUZE SPONGE 4X4 16PLY XRAY LF (GAUZE/BANDAGES/DRESSINGS) ×3 IMPLANT
GEL ULTRASOUND 20GR AQUASONIC (MISCELLANEOUS) IMPLANT
GLOVE BIO SURGEON STRL SZ7.5 (GLOVE) ×3 IMPLANT
GLOVE BIOGEL PI IND STRL 7.5 (GLOVE) ×2 IMPLANT
GLOVE BIOGEL PI INDICATOR 7.5 (GLOVE) ×1
GOWN STRL NON-REIN LRG LVL3 (GOWN DISPOSABLE) ×6 IMPLANT
KIT BASIN OR (CUSTOM PROCEDURE TRAY) ×3 IMPLANT
KIT ROOM TURNOVER OR (KITS) ×3 IMPLANT
NEEDLE 18GX1X1/2 (RX/OR ONLY) (NEEDLE) ×3 IMPLANT
NEEDLE 22X1 1/2 (OR ONLY) (NEEDLE) ×3 IMPLANT
NEEDLE HYPO 25GX1X1/2 BEV (NEEDLE) ×3 IMPLANT
NS IRRIG 1000ML POUR BTL (IV SOLUTION) ×3 IMPLANT
PACK CV ACCESS (CUSTOM PROCEDURE TRAY) ×3 IMPLANT
PACK SURGICAL SETUP 50X90 (CUSTOM PROCEDURE TRAY) ×3 IMPLANT
PAD ARMBOARD 7.5X6 YLW CONV (MISCELLANEOUS) ×6 IMPLANT
SPONGE GAUZE 2X2 STER 10/PKG (GAUZE/BANDAGES/DRESSINGS) ×1
SPONGE GAUZE 4X4 12PLY (GAUZE/BANDAGES/DRESSINGS) ×3 IMPLANT
SPONGE SURGIFOAM ABS GEL 100 (HEMOSTASIS) IMPLANT
SUT ETHILON 3 0 PS 1 (SUTURE) ×3 IMPLANT
SUT PROLENE 6 0 BV (SUTURE) IMPLANT
SUT SILK 0 TIES 10X30 (SUTURE) ×3 IMPLANT
SUT VIC AB 3-0 SH 27 (SUTURE) ×1
SUT VIC AB 3-0 SH 27X BRD (SUTURE) ×2 IMPLANT
SUT VICRYL 4-0 PS2 18IN ABS (SUTURE) ×3 IMPLANT
SWAB COLLECTION DEVICE MRSA (MISCELLANEOUS) IMPLANT
SYR 20CC LL (SYRINGE) ×6 IMPLANT
SYR 30ML LL (SYRINGE) IMPLANT
SYR 5ML LL (SYRINGE) ×6 IMPLANT
SYR CONTROL 10ML LL (SYRINGE) ×3 IMPLANT
SYRINGE 10CC LL (SYRINGE) ×3 IMPLANT
TOWEL OR 17X24 6PK STRL BLUE (TOWEL DISPOSABLE) ×3 IMPLANT
TOWEL OR 17X26 10 PK STRL BLUE (TOWEL DISPOSABLE) ×3 IMPLANT
TUBE ANAEROBIC SPECIMEN COL (MISCELLANEOUS) IMPLANT
UNDERPAD 30X30 INCONTINENT (UNDERPADS AND DIAPERS) ×3 IMPLANT
WATER STERILE IRR 1000ML POUR (IV SOLUTION) ×3 IMPLANT

## 2011-12-07 NOTE — ED Notes (Signed)
cbg is 99 

## 2011-12-07 NOTE — Progress Notes (Signed)
Dr. Arta Bruce notified of CBG's and interventions, and that Pt is okay now.  He states Okay.

## 2011-12-07 NOTE — ED Provider Notes (Signed)
Patient noted to have hypoglycemia with blood sugar of 30 today prior to surgery for revision of dialysis graft at right upper extremity. She was treated with D50 and oral glucose prior to arrival. Patient also complains of pain at right hand for the past 4 months which she states is due to vascular insufficiency of the right hand, pain is unchanged also complains of pain at suprapubic area at site of her renal transplant. Renal transplant is no longer usable as patient is now on hemodialysis pain at suprapubic area is unchanged for several months as well . On exam patient is alert in no distress right upper extremity with dialysis graft with good thrill at upper arm radial pulse is not obtainable however fingers are warm and there is no temperature differential between right and left upper extremities.Marland Kitchen abdomen there is a 2 mm wound dehiscence  at suprapubic area. Abdomen is nontender not red or warm. Patient was given a meal in the emergency department. She was instructed On the phone by surgical nurse to cut her insulin dose by half tonight. She will have elective revision of her dialysis graft tomorrow. Hypoglycemia is felt secondary to fact that patient was n.p.o. prior to procedure which was to occur today  Doug Sou, MD 12/07/11 1728

## 2011-12-07 NOTE — ED Notes (Signed)
Pt. States BG normally in the 100s but  sometimes rises to 200-300s. Alter and oriented X4. No complaints.

## 2011-12-07 NOTE — ED Notes (Signed)
Pt. Also c/o pain in lower abdomen "where I had my transplant".  Small opening at base of mid abdomen. Pt states "It used to be closed but now it's open and hurts a lot".

## 2011-12-07 NOTE — ED Notes (Signed)
Per short stay nurse, pt. Here for procedure and checked BG, 35. Pt. Lethargic and confused. Given instant glucose oral gel and 1/2 amp D50. Rechecked BG 120. Rechecked again 15 minutes later BG was 21, rechecked 60. Pt. Given sprite, crackers, and 1/2 amp D50. Pt. more alert after treatment.

## 2011-12-07 NOTE — ED Notes (Signed)
Pt. Here from short stay for hypoglycemia. Pt. States she did not have anything to eat this morning or have insulin due to procedure. Pt. Alert and oriented X4. Pt. CBG on arrival: 99.

## 2011-12-07 NOTE — Progress Notes (Signed)
Pt's cbg was 35. Iv access attempted Unsuccessfully. Glutose 37.5gms administered sublingually due to pt being symptomatic/ sweaty.  CBG 64.  Dr. Arta Bruce called but unable to talk at this time. Instructed me to call back in 5 minutes.  Once IV  access obtained 1.2 amp of dextrose 50% given IV.  CBG retaken and is 141.

## 2011-12-07 NOTE — ED Provider Notes (Signed)
History     CSN: [id number]  Arrival date & time 12/07/11  1212   First MD Initiated Contact with Patient 12/07/11 1422      Chief Complaint  Patient presents with  . Hypoglycemia    (Consider location/radiation/quality/duration/timing/severity/associated sxs/prior treatment) HPI Hx from pt. 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 Pt was at short stay to have a procedure performed on her AV graft by Dr. Edilia Bo of vasc surgery. She was NPO after midnight and did not take her AM insulin as instructed. She did take her normal Lantus dose, 7 units, last night at bedtime. She was hypoglycemic and confused at short stay with BG 35 and treated as below in RN note. At present time, pt has been in ED for 2 hours and reports she is feeling back to baseline. Has been able to eat food while here. Has had trouble in the past with hypoglycemia.  Pt also c/o pain to her R hand which has been ongoing for several months - she was told it may be due to her graft as graft is in this arm - this has not significantly changed. Also has a small area of her abd scar from her transplant surgery which "opened up" a few months ago. She denies drainage from the wound, fevers, or pain from the area and reports that it seems to be healing. Reports that the wound is regularly checked when she's at dialysis.  Wanita Chamberlain, RN 12/07/2011 12:21  Per short stay nurse, pt. Here for procedure and checked BG, 35. Pt. Lethargic and confused. Given instant glucose oral gel and 1/2 amp D50. Rechecked BG 120. Rechecked again 15 minutes later BG was 21, rechecked 60. Pt. Given sprite, crackers, and 1/2 amp D50. Pt. more alert after treatment.   Past Medical History  Diagnosis Date  . Hyperlipidemia   . Alopecia   . Obesity   . Iron deficiency   . 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  RLS (restless legs syndrome)   . Respiratory failure   . Injury of conjunctiva and corneal abrasion of right eye without foreign body   . Cellulitis   . Dialysis patient     Tues; Thurs; Sat; [place]  . Angina   . 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 Anemia   . Pneumonia 2012    "double"  . Blood transfusion 2004    "when I had my transplant"  . Migraine   . 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 Hyperparathyroidism, secondary   . Diabetic retinopathy   . Hypertension     takes Metoprolol and Exforge daily  . Depression     takes Klonopin nightly  . Diabetes mellitus type 1     Pt states diagnosed at age 39 with prior episodes of DKA. S/p pancreatic transplant   . GERD (gastroesophageal reflux disease)     takes Protonix daily    Past Surgical History  Procedure Date  . 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 Thyroglobulin     x 7  . Av fistula placement     right upper arm  . Cholecystectomy 1995  .  hd graft placement/removal     "had 2 in  my left upper arm"  . Eye surgery   . Retinopathy surgery   . Tooth extraction 10/10/11    X's two    No family history on file.  History  Substance Use Topics  . Smoking status: Never Smoker   . Smokeless tobacco: Never Used  . Alcohol Use: No    OB History    Grav Para Term Preterm Abortions TAB SAB Ect Mult Living                  Review of Systems  Respiratory: Negative for shortness of breath.   Cardiovascular: Negative for chest pain.  Gastrointestinal: Negative for nausea, vomiting and abdominal pain.  Neurological: Positive for weakness. Negative for dizziness and light-headedness.  Psychiatric/Behavioral: Positive for confusion.    Allergies  Morphine and related; Tramadol; and Vicodin  Home Medications   Current Outpatient Rx  Name Route Sig Dispense Refill  . ALBUTEROL SULFATE HFA 108  (90 BASE) MCG/ACT IN AERS Inhalation Inhale 2 puffs into the lungs every 4 (four) hours as needed. For wheezing.     Marland Kitchen AMLODIPINE BESYLATE-VALSARTAN 10-160 MG PO TABS Oral Take 1 tablet by mouth daily.    . ASPIRIN EC 81 MG PO TBEC Oral Take 81 mg by mouth daily.    Marland Kitchen CALCIUM ACETATE 667 MG PO CAPS Oral Take 1,334-2,001 mg by mouth 3 (three) times daily with meals. Dosage depends on meal consumed    . CETIRIZINE HCL 10 MG PO CAPS Oral Take 1 capsule (10 mg total) by mouth at bedtime as needed. 30 capsule 0  . CLONAZEPAM 0.5 MG PO TABS Oral Take 2 mg by mouth at bedtime.     Marland Kitchen FLUTICASONE PROPIONATE 50 MCG/ACT NA SUSP Nasal Place 2 sprays into the nose daily. 16 g 0  . INSULIN ASPART 100 UNIT/ML [place] SOLN Subcutaneous Inject 5-8 Units into the skin 3 (three) times daily before meals. Inject 3 units three times daily, in addition to sliding scale.    . INSULIN GLARGINE 100 UNIT/ML [place] SOLN Subcutaneous Inject 7 Units into the skin 2 (two) times daily.    Marland Kitchen METOPROLOL SUCCINATE ER 100 MG PO TB24 Oral Take 100 mg by mouth 4 (four) times a week. Do not take the night before dialysis. Dialysis on Tues, Thurs & Sat.    Marland Kitchen MYCOPHENOLATE MOFETIL 250 MG PO CAPS Oral Take 250 mg by mouth 2 (two) times daily.     . OXYCODONE-ACETAMINOPHEN 5-325 MG PO TABS Oral Take 1-2 tablets by mouth every 4 (four) hours as needed for pain. 20 tablet 0  . PANTOPRAZOLE SODIUM 40 MG PO TBEC Oral Take 40 mg by mouth daily.    Marland Kitchen PREDNISONE 5 MG PO TABS Oral Take 1 tablet by mouth daily.    Marland Kitchen PROMETHAZINE HCL 25 MG PO TABS Oral Take 25 mg by mouth every 8 (eight) hours as needed. For nausea.    Marland Kitchen TACROLIMUS 1 MG PO CAPS Oral Take 1 mg by mouth 2 (two) times daily.     Marland Kitchen ZOLPIDEM TARTRATE 10 MG PO TABS Oral Take 10 mg by mouth at bedtime as needed. For insomnia.      BP 127/76  Pulse 73  Resp 20  SpO2 97%  LMP 11/14/2011  Physical Exam  Nursing note and vitals reviewed. Constitutional: She is oriented to person, place, and  time. She appears well-developed and well-nourished. No distress.  HENT:  Head: Normocephalic and atraumatic.  Neck: Normal range of motion.  Cardiovascular: Normal rate, regular rhythm and normal heart sounds.   Pulmonary/Chest: Effort normal and breath sounds normal. She exhibits no tenderness.  Abdominal: Soft. Bowel sounds are normal. There is no tenderness. There is no rebound and no guarding.       Large midline abd surgical scar. Tiny ~0.5cm area of dehiscence at bottom of scar in suprapubic area. No drainage or surrounding erythema.  Musculoskeletal: Normal range of motion.       AV graft in place to RUE with palp thrill. Neurovascularly intact in hand with sensory intact to light touch in radian, median, and ulnar distributions. Radial pulse intact. Capillary refill less than 3 seconds.   Neurological: She is alert and oriented to person, place, and time. No cranial nerve deficit. She exhibits normal muscle tone. Coordination normal.       Awake, alert, appropriate, GCS 15  Skin: Skin is warm and dry. She is not diaphoretic.  Psychiatric: She has a normal mood and affect.    ED Course  Procedures (including critical care time)   Labs Reviewed  GLUCOSE, CAPILLARY   No results found.   1. Hypoglycemia       MDM  Pt with hypoglycemia while at short stay to 35; she felt confused with this. Pt did take normal home dose of Lantus last evening and was NPO after MN for her procedure. This is likely the etiology of her hypoglycemia. Pt given food, 1/2 amp D50 with improvement. BGs checked while in dept and remain stable. Pt feels back to baseline at this time. ED RN discussed with RN in surgery office - Dr. Edilia Bo will be able to do the procedure tomorrow - recommend that the pt take half her evening dose of Lantus, NPO after midnight, and hold AM insulin. Pt verbalized understanding and agreed to this plan. Reasons to return discussed.  Case d/w Dr. Ethelda Chick who saw pt with  me.  Grant Fontana, PA-C 12/07/11 2037

## 2011-12-07 NOTE — ED Notes (Signed)
CBG 206 

## 2011-12-07 NOTE — ED Notes (Signed)
CBG 301 

## 2011-12-08 ENCOUNTER — Ambulatory Visit (HOSPITAL_COMMUNITY): Payer: Medicare Other

## 2011-12-08 ENCOUNTER — Encounter (HOSPITAL_COMMUNITY): Payer: Self-pay | Admitting: Anesthesiology

## 2011-12-08 ENCOUNTER — Ambulatory Visit (HOSPITAL_COMMUNITY)
Admit: 2011-12-08 | Discharge: 2011-12-08 | Disposition: A | Discharge status: Home / Self Care | Payer: Medicare Other | Source: Intra-hospital | Attending: Vascular Surgery | Admitting: Vascular Surgery

## 2011-12-08 ENCOUNTER — Ambulatory Visit: Admit: 2011-12-08 | Payer: Self-pay | Admitting: Vascular Surgery

## 2011-12-08 ENCOUNTER — Encounter (HOSPITAL_COMMUNITY): Payer: Self-pay | Admitting: Surgery

## 2011-12-08 ENCOUNTER — Encounter (HOSPITAL_COMMUNITY): Disposition: A | Discharge status: Home / Self Care | Payer: Self-pay | Attending: Vascular Surgery

## 2011-12-08 ENCOUNTER — Ambulatory Visit (HOSPITAL_COMMUNITY): Payer: Medicare Other | Admitting: Anesthesiology

## 2011-12-08 DIAGNOSIS — T82898A Other specified complication of vascular prosthetic devices, implants and grafts, initial encounter: Secondary | ICD-10-CM | POA: Insufficient documentation

## 2011-12-08 DIAGNOSIS — N186 End stage renal disease: Secondary | ICD-10-CM

## 2011-12-08 DIAGNOSIS — Z794 Long term (current) use of insulin: Secondary | ICD-10-CM | POA: Insufficient documentation

## 2011-12-08 DIAGNOSIS — I120 Hypertensive chronic kidney disease with stage 5 chronic kidney disease or end stage renal disease: Secondary | ICD-10-CM | POA: Insufficient documentation

## 2011-12-08 DIAGNOSIS — R51 Headache: Secondary | ICD-10-CM | POA: Insufficient documentation

## 2011-12-08 DIAGNOSIS — Y832 Surgical operation with anastomosis, bypass or graft as the cause of abnormal reaction of the patient, or of later complication, without mention of misadventure at the time of the procedure: Secondary | ICD-10-CM | POA: Insufficient documentation

## 2011-12-08 DIAGNOSIS — K219 Gastro-esophageal reflux disease without esophagitis: Secondary | ICD-10-CM | POA: Insufficient documentation

## 2011-12-08 DIAGNOSIS — F3289 Other specified depressive episodes: Secondary | ICD-10-CM | POA: Insufficient documentation

## 2011-12-08 DIAGNOSIS — F329 Major depressive disorder, single episode, unspecified: Secondary | ICD-10-CM | POA: Insufficient documentation

## 2011-12-08 DIAGNOSIS — Z992 Dependence on renal dialysis: Secondary | ICD-10-CM | POA: Insufficient documentation

## 2011-12-08 HISTORY — PX: INSERTION OF DIALYSIS CATHETER: SHX1324

## 2011-12-08 LAB — GLUCOSE, CAPILLARY: Glucose-Capillary: 456 mg/dL — ABNORMAL HIGH (ref 70–99)

## 2011-12-08 LAB — POCT I-STAT 4, (NA,K, GLUC, HGB,HCT)
Glucose, Bld: 382 mg/dL — ABNORMAL HIGH (ref 70–99)
HCT: 46 % (ref 36.0–46.0)
Hemoglobin: 15.6 g/dL — ABNORMAL HIGH (ref 12.0–15.0)
Potassium: 6.5 mEq/L (ref 3.5–5.1)
Sodium: 144 mEq/L (ref 135–145)

## 2011-12-08 SURGERY — LIGATION OF ARTERIOVENOUS  FISTULA
Anesthesia: Monitor Anesthesia Care | Site: Neck | Laterality: Right | Wound class: Clean

## 2011-12-08 MED ORDER — PROPOFOL 10 MG/ML IV BOLUS
INTRAVENOUS | Status: DC | PRN
  Administered 2011-12-08: 30 mg via INTRAVENOUS
  Administered 2011-12-08: 20 mg via INTRAVENOUS
  Administered 2011-12-08 (×2): 10 mg via INTRAVENOUS
  Administered 2011-12-08: 30 mg via INTRAVENOUS
  Administered 2011-12-08: 20 mg via INTRAVENOUS
  Administered 2011-12-08: 10 mg via INTRAVENOUS

## 2011-12-08 MED ORDER — LIDOCAINE-EPINEPHRINE 0.5 %-1:200000 IJ SOLN
INTRAMUSCULAR | Status: AC
  Filled 2011-12-08: qty 1

## 2011-12-08 MED ORDER — CEFAZOLIN SODIUM-DEXTROSE 2-3 GM-% IV SOLR: 2.0000 g | Freq: Once | INTRAVENOUS | Status: DC

## 2011-12-08 MED ORDER — ONDANSETRON HCL 4 MG/2ML IJ SOLN: 4.0000 mg | Freq: Once | INTRAMUSCULAR | Status: DC | PRN

## 2011-12-08 MED ORDER — 0.9 % SODIUM CHLORIDE (POUR BTL) OPTIME
TOPICAL | Status: DC | PRN
  Administered 2011-12-08: 1000 mL

## 2011-12-08 MED ORDER — CEFAZOLIN SODIUM 1-5 GM-% IV SOLN
INTRAVENOUS | Status: DC | PRN
  Administered 2011-12-08: 2 g via INTRAVENOUS

## 2011-12-08 MED ORDER — THROMBIN 5000 UNITS EX SOLR
CUTANEOUS | Status: AC
  Filled 2011-12-08: qty 5000

## 2011-12-08 MED ORDER — FENTANYL CITRATE 0.05 MG/ML IJ SOLN
INTRAMUSCULAR | Status: DC | PRN
  Administered 2011-12-08: 75 ug via INTRAVENOUS
  Administered 2011-12-08: 50 ug via INTRAVENOUS
  Administered 2011-12-08 (×5): 25 ug via INTRAVENOUS

## 2011-12-08 MED ORDER — LIDOCAINE-EPINEPHRINE (PF) 1 %-1:200000 IJ SOLN
INTRAMUSCULAR | Status: DC | PRN
  Administered 2011-12-08: 25 mL

## 2011-12-08 MED ORDER — HYDROMORPHONE HCL PF 1 MG/ML IJ SOLN
INTRAMUSCULAR | Status: AC
  Filled 2011-12-08: qty 1

## 2011-12-08 MED ORDER — HEPARIN SODIUM (PORCINE) 1000 UNIT/ML IJ SOLN
INTRAMUSCULAR | Status: AC
  Filled 2011-12-08: qty 1

## 2011-12-08 MED ORDER — INSULIN ASPART 100 UNIT/ML ~~LOC~~ SOLN
7.0000 [IU] | Freq: Once | SUBCUTANEOUS | Status: AC
  Administered 2011-12-08: 7 [IU] via SUBCUTANEOUS

## 2011-12-08 MED ORDER — OXYCODONE-ACETAMINOPHEN 5-325 MG PO TABS: 1.0000 | ORAL_TABLET | ORAL | Status: AC | PRN

## 2011-12-08 MED ORDER — INSULIN ASPART 100 UNIT/ML ~~LOC~~ SOLN
SUBCUTANEOUS | Status: AC
  Filled 2011-12-08: qty 1

## 2011-12-08 MED ORDER — SODIUM CHLORIDE 0.9 % IV SOLN
INTRAVENOUS | Status: DC | PRN
  Administered 2011-12-08: 07:00:00 via INTRAVENOUS

## 2011-12-08 MED ORDER — HYDROMORPHONE HCL PF 1 MG/ML IJ SOLN
0.2500 mg | INTRAMUSCULAR | Status: DC | PRN
  Administered 2011-12-08 (×4): 0.5 mg via INTRAVENOUS

## 2011-12-08 MED ORDER — CEFAZOLIN SODIUM-DEXTROSE 2-3 GM-% IV SOLR
INTRAVENOUS | Status: AC
  Filled 2011-12-08: qty 50

## 2011-12-08 MED ORDER — LIDOCAINE-EPINEPHRINE (PF) 1 %-1:200000 IJ SOLN
INTRAMUSCULAR | Status: AC
  Filled 2011-12-08: qty 10

## 2011-12-08 MED ORDER — HEPARIN SODIUM (PORCINE) 1000 UNIT/ML IJ SOLN
INTRAMUSCULAR | Status: DC | PRN
  Administered 2011-12-08: 4.6 mL

## 2011-12-08 MED ORDER — SODIUM CHLORIDE 0.9 % IV SOLN: INTRAVENOUS | Status: DC

## 2011-12-08 MED ORDER — ALBUTEROL SULFATE HFA 108 (90 BASE) MCG/ACT IN AERS
INHALATION_SPRAY | RESPIRATORY_TRACT | Status: DC | PRN
  Administered 2011-12-08: 4 via RESPIRATORY_TRACT

## 2011-12-08 MED ORDER — LIDOCAINE HCL (CARDIAC) 20 MG/ML IV SOLN
INTRAVENOUS | Status: DC | PRN
  Administered 2011-12-08: 70 mg via INTRAVENOUS

## 2011-12-08 MED ORDER — PROPOFOL 10 MG/ML IV EMUL
INTRAVENOUS | Status: DC | PRN
  Administered 2011-12-08: 100 ug/kg/min via INTRAVENOUS

## 2011-12-08 MED ORDER — MIDAZOLAM HCL 5 MG/5ML IJ SOLN
INTRAMUSCULAR | Status: DC | PRN
  Administered 2011-12-08: 2 mg via INTRAVENOUS

## 2011-12-08 MED ORDER — SODIUM CHLORIDE 0.9 % IR SOLN
Status: DC | PRN
  Administered 2011-12-08: 09:00:00

## 2011-12-08 SURGICAL SUPPLY — 60 items
BAG DECANTER FOR FLEXI CONT (MISCELLANEOUS) ×3 IMPLANT
CANISTER SUCTION 2500CC (MISCELLANEOUS) ×3 IMPLANT
CATH CANNON HEMO 15F 50CM (CATHETERS) IMPLANT
CATH CANNON HEMO 15FR 19 (HEMODIALYSIS SUPPLIES) ×3 IMPLANT
CATH CANNON HEMO 15FR 23CM (HEMODIALYSIS SUPPLIES) ×3 IMPLANT
CATH CANNON HEMO 15FR 31CM (HEMODIALYSIS SUPPLIES) IMPLANT
CATH CANNON HEMO 15FR 32CM (HEMODIALYSIS SUPPLIES) IMPLANT
CATH SOFT-VU 4F 65 STRAIGHT (CATHETERS) ×2 IMPLANT
CATH SOFT-VU STRAIGHT 4F 65CM (CATHETERS) ×1
CATH STRAIGHT 5FR 65CM (CATHETERS) ×3 IMPLANT
CHLORAPREP W/TINT 26ML (MISCELLANEOUS) ×3 IMPLANT
CLOTH BEACON ORANGE TIMEOUT ST (SAFETY) ×3 IMPLANT
COVER PROBE W GEL 5X96 (DRAPES) ×3 IMPLANT
COVER SURGICAL LIGHT HANDLE (MISCELLANEOUS) ×6 IMPLANT
DERMABOND ADVANCED (GAUZE/BANDAGES/DRESSINGS) ×1
DERMABOND ADVANCED .7 DNX12 (GAUZE/BANDAGES/DRESSINGS) ×2 IMPLANT
DEVICE TORQUE H2O (MISCELLANEOUS) ×3 IMPLANT
DRAPE C-ARM 42X72 X-RAY (DRAPES) ×3 IMPLANT
DRAPE CHEST BREAST 15X10 FENES (DRAPES) ×3 IMPLANT
ELECT REM PT RETURN 9FT ADLT (ELECTROSURGICAL) ×3
ELECTRODE REM PT RTRN 9FT ADLT (ELECTROSURGICAL) ×2 IMPLANT
GAUZE SPONGE 2X2 8PLY STRL LF (GAUZE/BANDAGES/DRESSINGS) ×2 IMPLANT
GAUZE SPONGE 4X4 16PLY XRAY LF (GAUZE/BANDAGES/DRESSINGS) ×6 IMPLANT
GEL ULTRASOUND 20GR AQUASONIC (MISCELLANEOUS) IMPLANT
GLOVE BIO SURGEON STRL SZ7.5 (GLOVE) ×3 IMPLANT
GLOVE BIOGEL PI IND STRL 7.5 (GLOVE) ×2 IMPLANT
GLOVE BIOGEL PI INDICATOR 7.5 (GLOVE) ×1
GOWN STRL NON-REIN LRG LVL3 (GOWN DISPOSABLE) ×6 IMPLANT
GUIDEWIRE ANGLED .035X150CM (WIRE) ×3 IMPLANT
KIT BASIN OR (CUSTOM PROCEDURE TRAY) ×3 IMPLANT
KIT ROOM TURNOVER OR (KITS) ×3 IMPLANT
NEEDLE 18GX1X1/2 (RX/OR ONLY) (NEEDLE) ×3 IMPLANT
NEEDLE 22X1 1/2 (OR ONLY) (NEEDLE) ×3 IMPLANT
NEEDLE HYPO 25GX1X1/2 BEV (NEEDLE) ×3 IMPLANT
NS IRRIG 1000ML POUR BTL (IV SOLUTION) ×3 IMPLANT
PACK CV ACCESS (CUSTOM PROCEDURE TRAY) ×3 IMPLANT
PACK SURGICAL SETUP 50X90 (CUSTOM PROCEDURE TRAY) IMPLANT
PAD ARMBOARD 7.5X6 YLW CONV (MISCELLANEOUS) ×3 IMPLANT
SPONGE GAUZE 2X2 STER 10/PKG (GAUZE/BANDAGES/DRESSINGS) ×1
SPONGE GAUZE 4X4 12PLY (GAUZE/BANDAGES/DRESSINGS) ×3 IMPLANT
SPONGE SURGIFOAM ABS GEL 100 (HEMOSTASIS) IMPLANT
SUT ETHILON 3 0 PS 1 (SUTURE) ×3 IMPLANT
SUT PROLENE 6 0 BV (SUTURE) IMPLANT
SUT SILK 0 TIES 10X30 (SUTURE) ×3 IMPLANT
SUT VIC AB 3-0 SH 27 (SUTURE) ×1
SUT VIC AB 3-0 SH 27X BRD (SUTURE) ×2 IMPLANT
SUT VICRYL 4-0 PS2 18IN ABS (SUTURE) ×3 IMPLANT
SWAB COLLECTION DEVICE MRSA (MISCELLANEOUS) IMPLANT
SYR 20CC LL (SYRINGE) IMPLANT
SYR 30ML LL (SYRINGE) IMPLANT
SYR 5ML LL (SYRINGE) ×3 IMPLANT
SYR CONTROL 10ML LL (SYRINGE) IMPLANT
SYRINGE 10CC LL (SYRINGE) IMPLANT
TAPE CLOTH SURG 4X10 WHT LF (GAUZE/BANDAGES/DRESSINGS) ×3 IMPLANT
TOWEL OR 17X24 6PK STRL BLUE (TOWEL DISPOSABLE) ×3 IMPLANT
TOWEL OR 17X26 10 PK STRL BLUE (TOWEL DISPOSABLE) ×3 IMPLANT
TUBE ANAEROBIC SPECIMEN COL (MISCELLANEOUS) IMPLANT
UNDERPAD 30X30 INCONTINENT (UNDERPADS AND DIAPERS) ×3 IMPLANT
WATER STERILE IRR 1000ML POUR (IV SOLUTION) ×3 IMPLANT
WIRE AMPLATZ SS-J .035X180CM (WIRE) ×3 IMPLANT

## 2011-12-08 NOTE — Op Note (Signed)
NAME: [name]   MRN: [id number] DOB: 1970/11/11    DATE OF OPERATION: 12/08/2011  PREOP DIAGNOSIS: steal syndrome right upper extremity  POSTOP DIAGNOSIS: same  PROCEDURE:  1. Ultrasound-guided placement of left IJ 23 cm Diatek catheter 2. Ligation of right upper arm AV fistula  SURGEON: Di Kindle. Edilia Bo, MD, FACS  ASSIST: none  ANESTHESIA: local with sedation   EBL: minimal  INDICATIONS: [name] is a 41 y.o. female presented with steal syndrome of her right upper extremity. She had undergone a previous DRIL procedure which initially helped her symptoms but subsequently she developed recurrent symptoms. She had the proximal fistula then plicated. However her symptoms persisted and therefore she presents for ligation of her fistula and placement of a catheter.  FINDINGS: I was unable to thread the wire including a glide wire through the right IJ. A left IJ catheter was placed with some difficulty as the vein was fairly small but I was able to get it catheter in good position.  TECHNIQUE: Patient was taken to the operating room and sedated by anesthesia. The neck and upper chest were prepped and draped in usual sterile fashion. I initially selected a right IJ. The vein was small. Under ultrasound guidance I cannulated the vein and got good blood return. However, I was unable to thread the J-wire or an angled Glidewire. Therefore it elected to proceed with placement of a left-sided catheter. On the left side also, the vein was small. Under direct ultrasound guidance, after the skin was anesthetized, the vein was cannulated and a guidewire introduced into the superior vena cava. Describe use of an angled Glidewire. I exchanged a Glidewire over a 4 [place] endhole catheter for an Amplatz wire. The tract over the wire was dilated and the dilator and peel-away sheath were passed over the wire. The peel-away sheath was removed. The catheter was threaded over the wire into the  peel-away sheath down into the right atrium. The peel-away sheath and wire were then removed. The exit site the catheter was selected and the skin anesthetized between the 2 areas. Cath was brought to the tunnel cut to the appropriate length and the distal ports were attached. Both ports withdrew easily and flushed with heparinized saline and filled with concentrated heparin. Catheter was secured at its exit site with a 3-0 nylon suture. The IJ cannulation site was closed with a 4-0 subcuticular stitch. Dermabond was applied. Sterile dressing was applied.  Next attention was turned to ligation of the right upper arm AV fistula. The right upper extremity was prepped and draped in the usual sterile fashion. After the skin was anesthetized a longitudinal incision was made over the proximal fistula. The fistula was dissected free and ligated with 2 2-0 silk ties. The fistula no longer had a thrill and was a good radial and ulnar signal with the Doppler. Hemostasis was obtained in the wound. The wound was closed with a deep layer of 3-0 Vicryl and the skin closed with 4-0 Vicryl. Dermabond was applied. The patient tolerated the procedure well and was transferred to the recovery room in stable condition. All needle and sponge counts were correct.  Waverly Ferrari, MD, FACS [facility]  DATE OF DICTATION:   12/08/2011

## 2011-12-08 NOTE — Anesthesia Preprocedure Evaluation (Addendum)
Anesthesia Evaluation  Patient identified by MRN, date of birth, ID band Patient awake    Reviewed: Allergy & Precautions, H&P , NPO status , Patient's Chart, lab work & pertinent test results, reviewed documented beta blocker date and time   History of Anesthesia Complications Negative for: history of anesthetic complications  Airway Mallampati: II TM Distance: >3 FB Neck ROM: Full    Dental  (+) Dental Advisory Given   Pulmonary pneumonia -, resolved,          Cardiovascular hypertension, Pt. on medications and Pt. on home beta blockers + angina     Neuro/Psych  Headaches, PSYCHIATRIC DISORDERS Depression    GI/Hepatic Neg liver ROS, GERD-  Medicated and Controlled,  Endo/Other  Well Controlled, Type 1, Insulin Dependent  Renal/GU ESRF and DialysisRenal diseasenegative Renal ROS     Musculoskeletal negative musculoskeletal ROS (+)   Abdominal   Peds  Hematology negative hematology ROS (+)   Anesthesia Other Findings   Reproductive/Obstetrics negative OB ROS                          Anesthesia Physical Anesthesia Plan  ASA: III  Anesthesia Plan: MAC   Post-op Pain Management:    Induction: Intravenous  Airway Management Planned: Natural Airway  Additional Equipment:   Intra-op Plan:   Post-operative Plan:   Informed Consent: I have reviewed the patients History and Physical, chart, labs and discussed the procedure including the risks, benefits and alternatives for the proposed anesthesia with the patient or authorized representative who has indicated his/her understanding and acceptance.     Plan Discussed with: CRNA and Surgeon  Anesthesia Plan Comments:         Anesthesia Quick Evaluation

## 2011-12-08 NOTE — H&P (View-Only) (Signed)
[facility]  Patient name: [name] MRN: [id number] DOB: 11/13/1970 Sex: female  REASON FOR VISIT: follow up after recent  Revision of right upper arm fistula.  HPI: [name] is a 41 y.o. female who has a complicated access history. She had a previous right basilic vein transposition. She subsequently developed steal syndrome. She underwent a DRIL procedure which resolved her symptoms but then subsequently developed recurrent steal. Most recently I resected redundant segment of her fistula and plicated the proximal fistula to try to limit blood flow in the fistula and improve her symptoms. He comes in for follow up visit. She states her symptoms have not improved significantly and are no longer tolerable. She wishes to have the fistula ligated.  Previous vein mapping showed no other options on the right for a fistula.   REVIEW OF SYSTEMS: [X ] denotes positive finding; [  ] denotes negative finding  CARDIOVASCULAR:  [ ] chest pain   [ ] dyspnea on exertion    CONSTITUTIONAL:  [ ] fever   [ ] chills  PHYSICAL EXAM: Filed Vitals:   12/05/11 0936  BP: 177/91  Pulse: 89  Temp: 97.8 F (36.6 C)  TempSrc: Oral  Height: 5' 1" (1.549 m)  Weight: 166 lb (75.297 kg)  SpO2: 99%   Body mass index is 31.37 kg/(m^2). GENERAL: The patient is a well-nourished female, in no acute distress. The vital signs are documented above. CARDIOVASCULAR: There is a regular rate and rhythm  PULMONARY: There is good air exchange bilaterally without wheezing or rales. Her right upper arm fistula has an excellent thrill. Her symptoms in her hand to improve when I compressed her fistula.  MEDICAL ISSUES: Is patient has persistent steal symptoms despite attempted revision. I see no other option given the progression of her symptoms but to ligate her fistula and place a catheter. We will then have her return to evaluate her for new access hopefully in the left arm if her  right arm has improved. Her surgery is scheduled for Friday, 12/07/2011. I have given her prescription for 20 oxycodone for pain until then..  DICKSON,CHRISTOPHER S [facility]: [phone]     

## 2011-12-08 NOTE — Anesthesia Postprocedure Evaluation (Signed)
Anesthesia Post Note  Patient: [name]  Procedure(s) Performed: Procedure(s) (LRB): LIGATION OF ARTERIOVENOUS  FISTULA (Right) INSERTION OF DIALYSIS CATHETER (Left)  Anesthesia type: MAC  Patient location: PACU  Post pain: Pain level controlled  Post assessment: Patient's Cardiovascular Status Stable  Last Vitals:  Filed Vitals:   12/08/11 1120  BP: 151/93  Pulse: 77  Temp:   Resp: 18    Post vital signs: Reviewed and stable  Level of consciousness: sedated  Complications: No apparent anesthesia complications

## 2011-12-08 NOTE — Preoperative (Signed)
Beta Blockers   Reason not to administer Beta Blockers:Not Applicable 

## 2011-12-08 NOTE — Transfer of Care (Signed)
Immediate Anesthesia Transfer of Care Note  Patient: [name]  Procedure(s) Performed: Procedure(s) (LRB): LIGATION OF ARTERIOVENOUS  FISTULA (Right) INSERTION OF DIALYSIS CATHETER (Left)  Patient Location: PACU  Anesthesia Type: MAC  Level of Consciousness: awake, alert  and oriented  Airway & Oxygen Therapy: Patient Spontanous Breathing  Post-op Assessment: Report given to PACU RN and Post -op Vital signs reviewed and stable  Post vital signs: Reviewed and stable  Complications: No apparent anesthesia complications

## 2011-12-08 NOTE — Interval H&P Note (Signed)
History and Physical Interval Note:  12/08/2011 7:32 AM  [name]  has presented today for surgery, with the diagnosis of End Stage Renal Disease  The various methods of treatment have been discussed with the patient and family. After consideration of risks, benefits and other options for treatment, the patient has consented to: LIGATION OF RIGHT ARTERIOVENOUS  FISTULA AND INSERTION OF DIALYSIS CATHETER (N/A) as a surgical intervention .  The patient's history has been reviewed, patient examined, no change in status, stable for surgery.  I have reviewed the patients' chart and labs.  Questions were answered to the patient's satisfaction.     [name]

## 2011-12-08 NOTE — ED Provider Notes (Signed)
Medical screening examination/treatment/procedure(s) were conducted as a shared visit with non-physician practitioner(s) and myself.  I personally evaluated the patient during the encounter  Doug Sou, MD 12/08/11 [phone]

## 2011-12-10 ENCOUNTER — Encounter (HOSPITAL_COMMUNITY): Payer: Self-pay

## 2011-12-11 ENCOUNTER — Other Ambulatory Visit: Payer: Self-pay | Admitting: *Deleted

## 2011-12-11 ENCOUNTER — Encounter: Payer: Self-pay | Admitting: Vascular Surgery

## 2011-12-11 ENCOUNTER — Encounter (HOSPITAL_COMMUNITY): Payer: Self-pay | Admitting: Vascular Surgery

## 2011-12-11 DIAGNOSIS — Z01810 Encounter for preprocedural cardiovascular examination: Secondary | ICD-10-CM

## 2011-12-11 DIAGNOSIS — N186 End stage renal disease: Secondary | ICD-10-CM

## 2011-12-18 ENCOUNTER — Encounter: Payer: Self-pay | Admitting: Vascular Surgery

## 2011-12-19 ENCOUNTER — Ambulatory Visit: Payer: Medicare Other | Admitting: Vascular Surgery

## 2011-12-19 ENCOUNTER — Ambulatory Visit (INDEPENDENT_AMBULATORY_CARE_PROVIDER_SITE_OTHER): Payer: Medicare Other | Admitting: *Deleted

## 2011-12-19 ENCOUNTER — Ambulatory Visit (INDEPENDENT_AMBULATORY_CARE_PROVIDER_SITE_OTHER): Payer: Medicare Other | Admitting: Vascular Surgery

## 2011-12-19 ENCOUNTER — Encounter: Payer: Self-pay | Admitting: Vascular Surgery

## 2011-12-19 VITALS — BP 184/91 | HR 89 | Temp 98.2°F | Ht 61.0 in | Wt 165.0 lb

## 2011-12-19 DIAGNOSIS — N186 End stage renal disease: Secondary | ICD-10-CM

## 2011-12-19 DIAGNOSIS — Z01810 Encounter for preprocedural cardiovascular examination: Secondary | ICD-10-CM

## 2011-12-19 NOTE — Assessment & Plan Note (Signed)
This patient has had her right basilic vein transposition ligated because of steal in her right upper extremity.. She did develop recurrent steal symptoms despite a DRIL procedure which was initially successful and then subsequently banding of her proximal fistula. She has had a previous left upper arm graft which was removed. Before proceeding with a thigh graft I think we should attempt access in the left arm. Vein mapping suggested potentially the basilic vein on the left could be used for a basilic vein transposition. The upper arm cephalic vein looks thickened and is likely not usable. I recommended exploration of her basilic vein for possible basilic vein transposition on the left placement of a new left upper arm graft. Certainly given her history of steal in the right arm will need to be concerned about this on the left also. However, again, I think this is preferable that proceeding straight to a thigh graft given her young age.

## 2011-12-19 NOTE — Progress Notes (Signed)
[facility]  Patient name: [name] MRN: [id number] DOB: Sep 21, 1970 Sex: female  REASON FOR VISIT: evaluate for new access  HPI: [name] is a 41 y.o. 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 She had developed steal after undergoing basilic vein transposition and underwent a DRIL procedure which was initially successful. She later developed recurrent symptoms and had banding of her proximal fistula did not resolve her symptoms. Her symptoms progress and ultimately she had to have her fistula ligated. She has a functioning catheter. She dialyzes on Tuesdays Thursdays and Saturdays. She has had no recent uremic symptoms.   REVIEW OF SYSTEMS: Arly.Keller ] denotes positive finding; [  ] denotes negative finding  CARDIOVASCULAR:  [ ]  chest pain   [ ]  dyspnea on exertion    CONSTITUTIONAL:  [ ]  fever   [ ]  chills  PHYSICAL EXAM: Filed Vitals:   12/19/11 1441  BP: 184/91  Pulse: 89  Temp: 98.2 F (36.8 C)  TempSrc: Oral  Height: 5\' 1"  (1.549 m)  Weight: 165 lb (74.844 kg)  SpO2: 100%   Body mass index is 31.18 kg/(m^2). GENERAL: The patient is a well-nourished female, in no acute distress. The vital signs are documented above. CARDIOVASCULAR: There is a regular rate and rhythm  PULMONARY: There is good air exchange bilaterally without wheezing or rales. She has diminished radial pulses bilaterally. The right upper arm fistula has been ligated.  MEDICAL ISSUES:  ESRD (end stage renal disease) This patient has had her right basilic vein transposition ligated because of steal in her right upper extremity.. She did develop recurrent steal symptoms despite a DRIL procedure which was initially successful and then subsequently banding of her proximal fistula. She has had a previous left upper arm graft which was removed. Before proceeding with a thigh graft I think we should attempt access in the left  arm. Vein mapping suggested potentially the basilic vein on the left could be used for a basilic vein transposition. The upper arm cephalic vein looks thickened and is likely not usable. I recommended exploration of her basilic vein for possible basilic vein transposition on the left placement of a new left upper arm graft. Certainly given her history of steal in the right arm will need to be concerned about this on the left also. However, again, I think this is preferable that proceeding straight to a thigh graft given her young age.   Margi Edmundson S [facility] [facility]: [phone]

## 2011-12-26 ENCOUNTER — Other Ambulatory Visit: Payer: Self-pay | Admitting: *Deleted

## 2011-12-26 NOTE — Procedures (Unsigned)
CEPHALIC VEIN MAPPING  INDICATION:  End-stage renal disease.  HISTORY: Right basilic vein transposition; ligation of right upper arm fistula, 12/08/2011; revision, 11/16/2011; history of left upper extremity graft, 01/02/09, nonfunctioning.  EXAM:  The right cephalic vein is compressible.  Diameter measurements range from 0.09 to 0.12.  The right basilic vein is compressible.  Diameter measurements range from 0.09 to 0.13 cm.  The left cephalic vein is compressible.  Diameter measurements range from 0.14 to 0.40 cm with thickened segments in the proximal and distal upper arm.  The left basilic vein is not compressible in the forearm, vein is small and contracted, suggestive of prior upper extremity thrombosis.  The basilic vein is patent at the proximal brachium to distal brachium in upper arm.  Diameter measurements range from 0.14 to 0.40 cm.  See attached worksheet for all measurements.    IMPRESSION: 1. Patent right cephalic and basilic veins with diameter measurements     as described above; however, multiple revascularizations have been     done on the right. 2. Patent left cephalic vein. 3. Basilic is patent from the proximal upper arm to the proximal     forearm.  A small contracted basilic vein is noted in the proximal     to distal forearm.      ___________________________________________ Di Kindle. Edilia Bo, M.D.  SS/MEDQ  D:  12/19/2011  T:  12/19/2011  Job:  [id number]

## 2012-01-02 ENCOUNTER — Encounter (HOSPITAL_COMMUNITY): Payer: Self-pay | Admitting: Respiratory Therapy

## 2012-01-07 ENCOUNTER — Other Ambulatory Visit: Payer: Self-pay

## 2012-01-09 ENCOUNTER — Encounter (HOSPITAL_COMMUNITY)
Admission: RE | Admit: 2012-01-09 | Discharge: 2012-01-09 | Payer: Medicare Other | Source: Ambulatory Visit | Attending: Vascular Surgery | Admitting: Vascular Surgery

## 2012-01-09 NOTE — Progress Notes (Signed)
Attempted to contact patient regarding PAT visit at both numbers listed with no answer. Home number listed is the only number that had an option to leave a message, left message, and requested that patient call back.

## 2012-01-17 ENCOUNTER — Encounter (HOSPITAL_COMMUNITY)
Admission: RE | Admit: 2012-01-17 | Discharge: 2012-01-17 | Payer: Medicare Other | Source: Ambulatory Visit | Attending: Vascular Surgery | Admitting: Vascular Surgery

## 2012-01-17 NOTE — Telephone Encounter (Signed)
Note was in error

## 2012-01-17 NOTE — Pre-Procedure Instructions (Signed)
20 [name]  01/17/2012   Your procedure is scheduled on:  Tuesday January 22, 2012.  Report to [facility] at 0530 AM.  Call this number if you have problems the morning of surgery: [phone]   Remember:   Do not eat food or drink:After Midnight.    Take these medicines the morning of surgery with A SIP OF WATER: Albuterol in haler if needed for wheezing, Amlodipine, Metoprolol (Toprol XL), Pantoprazole (Protonix), Promethazine (Phenergan)  Take half the usual dose of insulin the night before surgery  Hold all diabetic medications including insulins the morning of surgery.    Do not wear jewelry, make-up or nail polish.  Do not wear lotions, powders, or perfumes. .  Do not shave 48 hours prior to surgery.   Do not bring valuables to the hospital.  Contacts, dentures or bridgework may not be worn into surgery.  Leave suitcase in the car. After surgery it may be brought to your room.  For patients admitted to the hospital, checkout time is 11:00 AM the day of discharge.   Patients discharged the day of surgery will not be allowed to drive home.  Name and phone number of your driver:   Special Instructions: CHG Shower Use Special Wash: 1/2 bottle night before surgery and 1/2 bottle morning of surgery.   Please read over the following fact sheets that you were given: Pain Booklet, Coughing and Deep Breathing, MRSA Information and Surgical Site Infection Prevention

## 2012-01-21 ENCOUNTER — Encounter (HOSPITAL_COMMUNITY)
Admission: RE | Admit: 2012-01-21 | Discharge: 2012-01-21 | Disposition: A | Discharge status: Home / Self Care | Payer: Medicare Other | Source: Ambulatory Visit | Attending: Vascular Surgery | Admitting: Vascular Surgery

## 2012-01-21 ENCOUNTER — Encounter (HOSPITAL_COMMUNITY): Payer: Self-pay

## 2012-01-21 HISTORY — DX: Migraine, unspecified, not intractable, without status migrainosus: G43.909

## 2012-01-21 HISTORY — DX: Bronchitis, not specified as acute or chronic: J40

## 2012-01-21 LAB — SURGICAL PCR SCREEN
MRSA, PCR: NEGATIVE
Staphylococcus aureus: NEGATIVE

## 2012-01-21 MED ORDER — DEXTROSE 5 % IV SOLN
1.5000 g | INTRAVENOUS | Status: AC
  Administered 2012-01-22: 1.5 g via INTRAVENOUS
  Filled 2012-01-21: qty 1.5

## 2012-01-21 NOTE — Progress Notes (Signed)
Nurse informed patient to arrive at short stay at 0730 due to scheduled surgery being at 1042. Patient informed Nurse that she was instructed to arrive at short stay at 0530 tomorrow am. Patient then called Dr. Adele Dan office staff and spoke with Joyce Gross and she then spoke with Darel Hong, and Joyce Gross told Nurse that patient should arrive at short stay at 0530 instead of 0730 because the patient's surgery had been changed to 0730. Nurse verbalized understanding and instructed patient to arrive at 0530 instead of 0730. Patient verbalized understanding.

## 2012-01-21 NOTE — Progress Notes (Signed)
Lab staff unable to collect blood at this time. Will reenter orders from Dr. Edilia Bo for tomorrow.

## 2012-01-22 ENCOUNTER — Encounter (HOSPITAL_COMMUNITY): Payer: Self-pay | Admitting: Anesthesiology

## 2012-01-22 ENCOUNTER — Encounter (HOSPITAL_COMMUNITY)
Admission: RE | Disposition: A | Discharge status: Home / Self Care | Payer: Self-pay | Source: Ambulatory Visit | Attending: Vascular Surgery

## 2012-01-22 ENCOUNTER — Ambulatory Visit (HOSPITAL_COMMUNITY)
Admission: RE | Admit: 2012-01-22 | Discharge: 2012-01-22 | Disposition: A | Discharge status: Home / Self Care | Payer: Medicare Other | Source: Ambulatory Visit | Attending: Vascular Surgery | Admitting: Vascular Surgery

## 2012-01-22 ENCOUNTER — Ambulatory Visit (HOSPITAL_COMMUNITY): Payer: Medicare Other | Admitting: Anesthesiology

## 2012-01-22 DIAGNOSIS — N058 Unspecified nephritic syndrome with other morphologic changes: Secondary | ICD-10-CM | POA: Insufficient documentation

## 2012-01-22 DIAGNOSIS — E1029 Type 1 diabetes mellitus with other diabetic kidney complication: Secondary | ICD-10-CM | POA: Insufficient documentation

## 2012-01-22 DIAGNOSIS — N186 End stage renal disease: Secondary | ICD-10-CM

## 2012-01-22 DIAGNOSIS — F3289 Other specified depressive episodes: Secondary | ICD-10-CM | POA: Insufficient documentation

## 2012-01-22 DIAGNOSIS — Z992 Dependence on renal dialysis: Secondary | ICD-10-CM | POA: Insufficient documentation

## 2012-01-22 DIAGNOSIS — E669 Obesity, unspecified: Secondary | ICD-10-CM | POA: Insufficient documentation

## 2012-01-22 DIAGNOSIS — E785 Hyperlipidemia, unspecified: Secondary | ICD-10-CM | POA: Insufficient documentation

## 2012-01-22 DIAGNOSIS — F329 Major depressive disorder, single episode, unspecified: Secondary | ICD-10-CM | POA: Insufficient documentation

## 2012-01-22 DIAGNOSIS — K219 Gastro-esophageal reflux disease without esophagitis: Secondary | ICD-10-CM | POA: Insufficient documentation

## 2012-01-22 DIAGNOSIS — N2581 Secondary hyperparathyroidism of renal origin: Secondary | ICD-10-CM | POA: Insufficient documentation

## 2012-01-22 DIAGNOSIS — I120 Hypertensive chronic kidney disease with stage 5 chronic kidney disease or end stage renal disease: Secondary | ICD-10-CM | POA: Insufficient documentation

## 2012-01-22 DIAGNOSIS — Z01812 Encounter for preprocedural laboratory examination: Secondary | ICD-10-CM | POA: Insufficient documentation

## 2012-01-22 HISTORY — PX: AV FISTULA PLACEMENT: SHX1204

## 2012-01-22 LAB — POCT I-STAT GLUCOSE: Operator id: 305871

## 2012-01-22 LAB — GLUCOSE, CAPILLARY
Glucose-Capillary: 356 mg/dL — ABNORMAL HIGH (ref 70–99)
Glucose-Capillary: 98 mg/dL (ref 70–99)

## 2012-01-22 LAB — HCG, SERUM, QUALITATIVE: Preg, Serum: NEGATIVE

## 2012-01-22 LAB — POCT I-STAT 4, (NA,K, GLUC, HGB,HCT): HCT: 39 % (ref 36.0–46.0)

## 2012-01-22 SURGERY — INSERTION OF ARTERIOVENOUS (AV) GORE-TEX GRAFT ARM
Anesthesia: General | Site: Arm Upper | Laterality: Left | Wound class: Clean

## 2012-01-22 MED ORDER — EPHEDRINE SULFATE 50 MG/ML IJ SOLN
INTRAMUSCULAR | Status: DC | PRN
  Administered 2012-01-22: 10 mg via INTRAVENOUS
  Administered 2012-01-22 (×2): 5 mg via INTRAVENOUS

## 2012-01-22 MED ORDER — MIDAZOLAM HCL 5 MG/5ML IJ SOLN
INTRAMUSCULAR | Status: DC | PRN
  Administered 2012-01-22: 2 mg via INTRAVENOUS

## 2012-01-22 MED ORDER — FENTANYL CITRATE 0.05 MG/ML IJ SOLN
50.0000 ug | Freq: Once | INTRAMUSCULAR | Status: AC
  Administered 2012-01-22: 50 ug via INTRAVENOUS

## 2012-01-22 MED ORDER — METOCLOPRAMIDE HCL 5 MG/ML IJ SOLN
INTRAMUSCULAR | Status: AC
  Administered 2012-01-22: 5 mg via INTRAVENOUS
  Filled 2012-01-22: qty 2

## 2012-01-22 MED ORDER — LIDOCAINE HCL (CARDIAC) 20 MG/ML IV SOLN
INTRAVENOUS | Status: DC | PRN
  Administered 2012-01-22: 100 mg via INTRAVENOUS

## 2012-01-22 MED ORDER — HYDROMORPHONE HCL 2 MG PO TABS
2.0000 mg | ORAL_TABLET | Freq: Once | ORAL | Status: AC
  Administered 2012-01-22: 2 mg via ORAL

## 2012-01-22 MED ORDER — METOCLOPRAMIDE HCL 5 MG/ML IJ SOLN
5.0000 mg | Freq: Once | INTRAMUSCULAR | Status: AC
  Administered 2012-01-22: 5 mg via INTRAVENOUS

## 2012-01-22 MED ORDER — HYDROMORPHONE HCL 2 MG PO TABS: 2.0000 mg | ORAL_TABLET | Freq: Four times a day (QID) | ORAL | Status: AC | PRN

## 2012-01-22 MED ORDER — INSULIN ASPART 100 UNIT/ML ~~LOC~~ SOLN
8.0000 [IU] | Freq: Once | SUBCUTANEOUS | Status: DC
  Filled 2012-01-22: qty 3

## 2012-01-22 MED ORDER — ROCURONIUM BROMIDE 100 MG/10ML IV SOLN
INTRAVENOUS | Status: DC | PRN
  Administered 2012-01-22: 35 mg via INTRAVENOUS

## 2012-01-22 MED ORDER — PROTAMINE SULFATE 10 MG/ML IV SOLN
INTRAVENOUS | Status: DC | PRN
  Administered 2012-01-22: 20 mg via INTRAVENOUS
  Administered 2012-01-22: 10 mg via INTRAVENOUS

## 2012-01-22 MED ORDER — PROPOFOL 10 MG/ML IV EMUL
INTRAVENOUS | Status: DC | PRN
  Administered 2012-01-22: 170 mg via INTRAVENOUS

## 2012-01-22 MED ORDER — LIDOCAINE HCL 4 % MT SOLN
OROMUCOSAL | Status: DC | PRN
  Administered 2012-01-22: 4 mL via TOPICAL

## 2012-01-22 MED ORDER — FENTANYL CITRATE 0.05 MG/ML IJ SOLN
INTRAMUSCULAR | Status: AC
  Administered 2012-01-22: 50 ug via INTRAVENOUS
  Filled 2012-01-22: qty 2

## 2012-01-22 MED ORDER — SODIUM CHLORIDE 0.9 % IV SOLN
INTRAVENOUS | Status: DC
  Administered 2012-01-22: 07:00:00 via INTRAVENOUS

## 2012-01-22 MED ORDER — SODIUM CHLORIDE 0.9 % IV SOLN
INTRAVENOUS | Status: DC | PRN
  Administered 2012-01-22 (×2): via INTRAVENOUS

## 2012-01-22 MED ORDER — NEOSTIGMINE METHYLSULFATE 1 MG/ML IJ SOLN
INTRAMUSCULAR | Status: DC | PRN
  Administered 2012-01-22: 2.5 mg via INTRAVENOUS

## 2012-01-22 MED ORDER — HEPARIN SODIUM (PORCINE) 1000 UNIT/ML IJ SOLN
INTRAMUSCULAR | Status: DC | PRN
  Administered 2012-01-22: 6000 [IU] via INTRAVENOUS

## 2012-01-22 MED ORDER — THROMBIN 20000 UNITS EX SOLR
CUTANEOUS | Status: DC | PRN
  Administered 2012-01-22: 10:00:00 via TOPICAL

## 2012-01-22 MED ORDER — FENTANYL CITRATE 0.05 MG/ML IJ SOLN
INTRAMUSCULAR | Status: AC
  Administered 2012-01-22: 25 ug via INTRAVENOUS
  Filled 2012-01-22: qty 2

## 2012-01-22 MED ORDER — 0.9 % SODIUM CHLORIDE (POUR BTL) OPTIME
TOPICAL | Status: DC | PRN
  Administered 2012-01-22: 1000 mL

## 2012-01-22 MED ORDER — SODIUM CHLORIDE 0.9 % IR SOLN
Status: DC | PRN
  Administered 2012-01-22: 09:00:00

## 2012-01-22 MED ORDER — GLYCOPYRROLATE 0.2 MG/ML IJ SOLN
INTRAMUSCULAR | Status: DC | PRN
  Administered 2012-01-22: 0.2 mg via INTRAVENOUS

## 2012-01-22 MED ORDER — THROMBIN 20000 UNITS EX SOLR
CUTANEOUS | Status: DC | PRN
  Administered 2012-01-22: 20000 [IU] via TOPICAL

## 2012-01-22 MED ORDER — FENTANYL CITRATE 0.05 MG/ML IJ SOLN
25.0000 ug | INTRAMUSCULAR | Status: DC | PRN
  Administered 2012-01-22 (×2): 50 ug via INTRAVENOUS
  Administered 2012-01-22 (×2): 25 ug via INTRAVENOUS

## 2012-01-22 MED ORDER — FENTANYL CITRATE 0.05 MG/ML IJ SOLN
INTRAMUSCULAR | Status: DC | PRN
  Administered 2012-01-22: 50 ug via INTRAVENOUS
  Administered 2012-01-22: 25 ug via INTRAVENOUS
  Administered 2012-01-22 (×2): 50 ug via INTRAVENOUS

## 2012-01-22 MED ORDER — ONDANSETRON HCL 4 MG/2ML IJ SOLN
INTRAMUSCULAR | Status: DC | PRN
  Administered 2012-01-22: 4 mg via INTRAVENOUS

## 2012-01-22 MED ORDER — INSULIN ASPART 100 UNIT/ML ~~LOC~~ SOLN
SUBCUTANEOUS | Status: AC
  Filled 2012-01-22: qty 5

## 2012-01-22 MED ORDER — PHENYLEPHRINE HCL 10 MG/ML IJ SOLN
INTRAMUSCULAR | Status: DC | PRN
  Administered 2012-01-22 (×4): 80 ug via INTRAVENOUS

## 2012-01-22 SURGICAL SUPPLY — 46 items
CANISTER SUCTION 2500CC (MISCELLANEOUS) ×2 IMPLANT
CLIP TI MEDIUM 6 (CLIP) ×2 IMPLANT
CLIP TI WIDE RED SMALL 24 (CLIP) ×2 IMPLANT
CLIP TI WIDE RED SMALL 6 (CLIP) ×2 IMPLANT
CLOTH BEACON ORANGE TIMEOUT ST (SAFETY) ×2 IMPLANT
COVER PROBE W GEL 5X96 (DRAPES) ×2 IMPLANT
COVER SURGICAL LIGHT HANDLE (MISCELLANEOUS) ×2 IMPLANT
DECANTER SPIKE VIAL GLASS SM (MISCELLANEOUS) ×2 IMPLANT
DERMABOND ADVANCED (GAUZE/BANDAGES/DRESSINGS) ×1
DERMABOND ADVANCED .7 DNX12 (GAUZE/BANDAGES/DRESSINGS) ×1 IMPLANT
DRAIN PENROSE 1/2X12 LTX STRL (WOUND CARE) IMPLANT
ELECT REM PT RETURN 9FT ADLT (ELECTROSURGICAL) ×2
ELECTRODE REM PT RTRN 9FT ADLT (ELECTROSURGICAL) ×1 IMPLANT
GAUZE SPONGE 4X4 16PLY XRAY LF (GAUZE/BANDAGES/DRESSINGS) ×2 IMPLANT
GEL ULTRASOUND 20GR AQUASONIC (MISCELLANEOUS) ×2 IMPLANT
GLOVE BIO SURGEON STRL SZ 6.5 (GLOVE) ×4 IMPLANT
GLOVE BIO SURGEON STRL SZ7.5 (GLOVE) ×4 IMPLANT
GLOVE BIOGEL PI IND STRL 6.5 (GLOVE) ×4 IMPLANT
GLOVE BIOGEL PI IND STRL 7.0 (GLOVE) ×1 IMPLANT
GLOVE BIOGEL PI IND STRL 7.5 (GLOVE) ×1 IMPLANT
GLOVE BIOGEL PI INDICATOR 6.5 (GLOVE) ×4
GLOVE BIOGEL PI INDICATOR 7.0 (GLOVE) ×1
GLOVE BIOGEL PI INDICATOR 7.5 (GLOVE) ×1
GLOVE ECLIPSE 7.0 STRL STRAW (GLOVE) ×2 IMPLANT
GLOVE SURG SS PI 6.5 STRL IVOR (GLOVE) ×6 IMPLANT
GOWN STRL NON-REIN LRG LVL3 (GOWN DISPOSABLE) ×12 IMPLANT
GRAFT GORETEX STRT 4-7X45 (Vascular Products) ×2 IMPLANT
KIT BASIN OR (CUSTOM PROCEDURE TRAY) ×2 IMPLANT
KIT ROOM TURNOVER OR (KITS) ×2 IMPLANT
NS IRRIG 1000ML POUR BTL (IV SOLUTION) ×2 IMPLANT
PACK CV ACCESS (CUSTOM PROCEDURE TRAY) ×2 IMPLANT
PAD ARMBOARD 7.5X6 YLW CONV (MISCELLANEOUS) ×4 IMPLANT
SPONGE GAUZE 4X4 12PLY (GAUZE/BANDAGES/DRESSINGS) ×2 IMPLANT
SPONGE SURGIFOAM ABS GEL 100 (HEMOSTASIS) IMPLANT
SUT ETHILON 3 0 PS 1 (SUTURE) ×4 IMPLANT
SUT PROLENE 6 0 BV (SUTURE) ×12 IMPLANT
SUT SILK 3 0 (SUTURE) ×1
SUT SILK 3-0 18XBRD TIE 12 (SUTURE) ×1 IMPLANT
SUT VIC AB 3-0 SH 27 (SUTURE) ×3
SUT VIC AB 3-0 SH 27X BRD (SUTURE) ×3 IMPLANT
SUT VIC AB 4-0 PS2 27 (SUTURE) ×4 IMPLANT
SUT VICRYL 4-0 PS2 18IN ABS (SUTURE) ×2 IMPLANT
TOWEL OR 17X24 6PK STRL BLUE (TOWEL DISPOSABLE) ×2 IMPLANT
TOWEL OR 17X26 10 PK STRL BLUE (TOWEL DISPOSABLE) ×2 IMPLANT
UNDERPAD 30X30 INCONTINENT (UNDERPADS AND DIAPERS) ×2 IMPLANT
WATER STERILE IRR 1000ML POUR (IV SOLUTION) ×2 IMPLANT

## 2012-01-22 NOTE — Transfer of Care (Signed)
Immediate Anesthesia Transfer of Care Note  Patient: [name]  Procedure(s) Performed: Procedure(s) (LRB): INSERTION OF ARTERIOVENOUS (AV) GORE-TEX GRAFT ARM (Left)  Patient Location: PACU  Anesthesia Type: General  Level of Consciousness: awake, alert , oriented, sedated and patient cooperative  Airway & Oxygen Therapy: Patient Spontanous Breathing and Patient connected to face mask oxygen  Post-op Assessment: Report given to PACU RN and Post -op Vital signs reviewed and stable  Post vital signs: Reviewed and stable  Complications: No apparent anesthesia complications

## 2012-01-22 NOTE — Progress Notes (Signed)
Left dialysis catheter accessed per order from Dr. Sampson Goon after withdrawing 10 mL of waste. Blood drawn for lab. 250 mL NS bag placed on pump programmed for 20 mL/Hr with volume to be infused of 40

## 2012-01-22 NOTE — H&P (Signed)
[facility] [place]  Patient name: [name] MRN: [id number] DOB: [birth year] Sex: female  HISTORY AND PHYSICAL EXAM  HPI: [name] is a 41 y.o. 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 She had developed steal after undergoing basilic vein transposition and underwent a DRIL procedure which was initially successful. She later developed recurrent symptoms and had banding of her proximal fistula did not resolve her symptoms. Her symptoms progress and ultimately she had to have her fistula ligated. She has a functioning catheter. She dialyzes on Tuesdays Thursdays and Saturdays. She has had no recent uremic symptoms.   Past Medical History  Diagnosis Date  . Hyperlipidemia   . Alopecia   . Obesity   . Iron deficiency   . 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 RLS (restless legs syndrome)   . Respiratory failure   . Injury of conjunctiva and corneal abrasion of right eye without foreign body   . Cellulitis   . Dialysis patient     Tues; Thurs; Sat; [place]  . Angina   . 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 Anemia   . Pneumonia 2012    "double"  . Blood transfusion 2004    "when I had my transplant"  . Migraine   . 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 Hyperparathyroidism, secondary   . Diabetic retinopathy   . Hypertension     takes Metoprolol and Exforge daily  . Depression     takes Klonopin nightly  . Diabetes mellitus type 1     Pt states diagnosed at age 63 with prior episodes of DKA. S/p pancreatic transplant   . GERD (gastroesophageal reflux disease)     takes Protonix daily  . PONV (postoperative nausea and vomiting)   . Bronchitis   . Migraine     No family history on file.  SOCIAL HISTORY: History  Substance Use Topics  . Smoking  status: Never Smoker   . Smokeless tobacco: Never Used  . Alcohol Use: No    Allergies  Allergen Reactions  . Morphine And Related Nausea And Vomiting and Other (See Comments)    "makes me delerirous"  . Tramadol Nausea And Vomiting  . Percocet (Oxycodone-Acetaminophen) Itching  . Vicodin (Hydrocodone-Acetaminophen) Rash    Current Facility-Administered Medications  Medication Dose Route Frequency Provider Last Rate Last Dose  . 0.9 %  sodium chloride infusion   Intravenous Continuous Chuck Hint, MD 20 mL/hr at 01/22/12 0645    . cefUROXime (ZINACEF) 1.5 g in dextrose 5 % 50 mL IVPB  1.5 g Intravenous 30 min Pre-Op Chuck Hint, MD      . insulin aspart (novoLOG) 100 UNIT/ML injection           . DISCONTD: insulin aspart (novoLOG) injection 8 Units  8 Units Subcutaneous Once W. Autumn Patty, MD       Facility-Administered Medications Ordered in Other Encounters  Medication Dose Route Frequency Provider Last Rate Last Dose  . 0.9 %  sodium chloride infusion    Continuous PRN Kristopher Key, CRNA        REVIEW OF SYSTEMS: Arly.Keller ] denotes positive finding; [  ] denotes negative finding CARDIOVASCULAR:  [ ]  chest pain   [ ]  chest pressure   [ ]   palpitations   [ ]  orthopnea  CONSTITUTIONAL:  [ ]  fever   [ ]  chills  PHYSICAL EXAM: Filed Vitals:   01/22/12 0623  BP: 158/87  Pulse: 72  Temp: 98.4 F (36.9 C)  TempSrc: Oral  Resp: 18  SpO2: 98%   There is no height or weight on file to calculate BMI. GENERAL: The patient is a well-nourished female, in no acute distress. The vital signs are documented above. CARDIOVASCULAR: There is a regular rate and rhythm. Diminished radial pulses bilat.  PULMONARY: There is good air exchange bilaterally without wheezing or rales. ABDOMEN: Soft and non-tender with normal pitched bowel sounds.  MUSCULOSKELETAL: There are no major deformities or cyanosis. NEUROLOGIC: No focal weakness or paresthesias are detected. SKIN:  There are no ulcers or rashes noted. PSYCHIATRIC: The patient has a normal affect.  DATA:  Lab Results  Component Value Date   WBC 8.4 08/28/2011   HGB 13.3 01/22/2012   HCT 39.0 01/22/2012   MCV 92.5 08/28/2011   PLT 316 08/28/2011   Lab Results  Component Value Date   NA 131* 01/22/2012   K 5.0 01/22/2012   CL 105 11/12/2011   CO2 19 08/28/2011   Lab Results  Component Value Date   CREATININE 9.10* 11/12/2011   Lab Results  Component Value Date   INR 1.06 01/27/2010   INR 1.0 12/26/2006   Lab Results  Component Value Date   HGBA1C 10.8* 07/15/2011   CBG (last 3)   Basename 01/22/12 0718  GLUCAP 356*   MEDICAL ISSUES: This patient has had her right basilic vein transposition ligated because of steal in her right upper extremity.. She did develop recurrent steal symptoms despite a DRIL procedure which was initially successful and then subsequently banding of her proximal fistula. She has had a previous left upper arm graft which was removed. Before proceeding with a thigh graft I think we should attempt access in the left arm. Vein mapping suggested potentially the basilic vein on the left could be used for a basilic vein transposition. The upper arm cephalic vein looks thickened and is likely not usable. I recommended exploration of her basilic vein for possible basilic vein transposition on the left placement of a new left upper arm graft. Certainly given her history of steal in the right arm will need to be concerned about this on the left also. However, again, I think this is preferable that proceeding straight to a thigh graft given her young age.  Elowyn Raupp S [facility] [place] Beeper: [phone]

## 2012-01-22 NOTE — Progress Notes (Signed)
Patient refused to take 8 units as ordered by Dr. Sampson Goon. Patient states she is willing to take 3 units. Notified Dr. Sampson Goon ordered cancelled states to notify patient they will address CBG in holding area.

## 2012-01-22 NOTE — Anesthesia Preprocedure Evaluation (Addendum)
Anesthesia Evaluation  Patient identified by MRN, date of birth, ID band Patient awake    Reviewed: Allergy & Precautions, H&P , NPO status , Patient's Chart, lab work & pertinent test results  History of Anesthesia Complications (+) PONV  Airway Mallampati: III TM Distance: >3 FB Neck ROM: Full    Dental   Pulmonary pneumonia -,  breath sounds clear to auscultation  Pulmonary exam normal       Cardiovascular hypertension, Pt. on medications and Pt. on home beta blockers + angina - CAD Rhythm:Regular Rate:Normal     Neuro/Psych  Headaches, PSYCHIATRIC DISORDERS Depression    GI/Hepatic Neg liver ROS, GERD-  Medicated and Controlled,  Endo/Other  Poorly Controlled, Type 1, Insulin Dependent  Renal/GU ESRF and DialysisRenal disease     Musculoskeletal   Abdominal Normal abdominal exam  (+)   Peds  Hematology negative hematology ROS (+)   Anesthesia Other Findings   Reproductive/Obstetrics                         Anesthesia Physical Anesthesia Plan  ASA: III  Anesthesia Plan: General   Post-op Pain Management:    Induction: Intravenous  Airway Management Planned: LMA  Additional Equipment:   Intra-op Plan:   Post-operative Plan: Extubation in OR  Informed Consent: I have reviewed the patients History and Physical, chart, labs and discussed the procedure including the risks, benefits and alternatives for the proposed anesthesia with the patient or authorized representative who has indicated his/her understanding and acceptance.   Dental advisory given  Plan Discussed with: CRNA, Anesthesiologist and Surgeon  Anesthesia Plan Comments:         Anesthesia Quick Evaluation

## 2012-01-22 NOTE — Progress Notes (Signed)
Spoke with pt and she states that she takes dilaudid for pain medication when needed.  Rx given for dilaudid 2mg  po q6hr prn pain #20 (twenty) NR  Orlandus Borowski 01/22/2012 11:28 AM

## 2012-01-22 NOTE — Anesthesia Postprocedure Evaluation (Signed)
  Anesthesia Post-op Note  Patient: [name]  Procedure(s) Performed: Procedure(s) (LRB): INSERTION OF ARTERIOVENOUS (AV) GORE-TEX GRAFT ARM (Left)  Patient Location: PACU  Anesthesia Type: General  Level of Consciousness: awake  Airway and Oxygen Therapy: Patient Spontanous Breathing  Post-op Pain: mild  Post-op Assessment: Post-op Vital signs reviewed  Post-op Vital Signs: Reviewed  Complications: No apparent anesthesia complications

## 2012-01-22 NOTE — Progress Notes (Signed)
Called to PACU to dc iv fluids to blue port of hemo cath;  Flushed per policy with 10cc ns, followed by 2.4cc heparin (1000 units/ml)  Both ports capped and clamped;  Pt for dialysis tomorrow;    Barkley Bruns RN [facility], IV Team

## 2012-01-22 NOTE — Progress Notes (Signed)
Dr.Fitzgerald notified that unable to get blood from right arm;do not use left arm;may obtain blood from [facility] cath

## 2012-01-22 NOTE — Progress Notes (Signed)
Received patient from pacu, rated pain 8/10 but stated she was ready to go.  Patient had good bruit and thrill lt arm. Went over discharge instructuions with patient and boyfriend and sent patient home with rx for dilaudid.

## 2012-01-22 NOTE — Op Note (Signed)
NAME: [name]   MRN: [id number] DOB: 06/06/1970    DATE OF OPERATION: 01/22/2012  PREOP DIAGNOSIS: chronic kidney disease  POSTOP DIAGNOSIS: same  PROCEDURE: exploration of left basilic vein. Placement of new left upper arm AV graft  SURGEON: Di Kindle. Edilia Bo, MD, FACS  ASSIST: Newton Pigg PA  ANESTHESIA: Gen.   EBL: minimal  INDICATIONS: [name] is a 41 y.o. female who presents for new access. She's had a complicated access history and has no further options in the right arm. Vein mapping suggested a marginal basilic vein on the left. She had previously failed upper arm graft on the left.  FINDINGS: the basilic vein was occluded in the axilla and was also too small. I did find one axillary vein that was patent the other axillary vein was occluded. The artery was also small. This patient is not a candidate for further surgical revisions given how high the venous anastomosis is in the axilla but she could potentially have thrombolysis and venoplasty in the future.  TECHNIQUE: The patient was brought to the operating room and received a general anesthetic. I elected to explore the basilic vein in the upper arm. The left upper tree was prepped and draped in usual sterile fashion. A longitudinal incision was made over the basilic vein in the left upper arm. The vein was very small. I did find a branch an attempt to irrigate the vein did appear to be occluded distally in the axilla. Therefore, the patient was not a candidate for basilic vein transposition. I let therefore elected to explore the axilla for a vein. The previous graft was identified after a transverse incision was made in the axilla. The vein proximal this was dissected free very high in the axilla but was not patent. I was able to find an adjacent axillary vein which was fairly small but did take a 3-1/2 mm dilator. There was no other options for venous outflow. Separate longitudinal incision was made over the  brachial artery above the antecubital level. The artery was somewhat small about 3 mm. As her no further options I elected to proceed with placement of a left upper arm AV graft. A 4-7 mm graft was tunneled between the 2 incisions and the patient was heparinized. The brachial artery was clamped proximally and distally a longitudinal arteriotomy was made. Only a short segment of the 4 mm and the graft was excised the graft slightly spatulated and sewn end-to-side to the brachial artery using continuous 6-0 Prolene suture. The graft and pulled to appropriate length for anastomosis to the high axillary vein. The vein was spatulated proximally. The graft to the appropriate length slightly spatulated and sewn into the into the vein using 2 continuous 6-0 Prolene sutures at the completion there was a thrill in the graft and a radial signal with the Doppler. Hemostasis was obtained in the wounds. Each of the wounds was closed the peritoneal Vicryl the skin closed with 4-0 Vicryl. A dietetic cath and was resutured with a 3-0 nylon suture after the old suture was removed which apparently was hurting her. Sterile dressings applied. The patient tolerated the procedure well and was transferred to the recovery room in stable condition. All needle and sponge counts were correct.  Waverly Ferrari, MD, FACS [facility]  DATE OF DICTATION:   01/22/2012

## 2012-01-22 NOTE — Preoperative (Signed)
Beta Blockers   Reason not to administer Beta Blockers:Not Applicable 

## 2012-01-23 ENCOUNTER — Encounter (HOSPITAL_COMMUNITY): Payer: Self-pay | Admitting: Vascular Surgery

## 2012-01-26 ENCOUNTER — Encounter (HOSPITAL_COMMUNITY): Payer: Self-pay | Admitting: Emergency Medicine

## 2012-01-26 ENCOUNTER — Emergency Department (HOSPITAL_COMMUNITY)
Admission: EM | Admit: 2012-01-26 | Discharge: 2012-01-26 | Disposition: A | Discharge status: Home / Self Care | Payer: Medicare Other | Attending: Emergency Medicine | Admitting: Emergency Medicine

## 2012-01-26 DIAGNOSIS — Z794 Long term (current) use of insulin: Secondary | ICD-10-CM | POA: Insufficient documentation

## 2012-01-26 DIAGNOSIS — Z9483 Pancreas transplant status: Secondary | ICD-10-CM | POA: Insufficient documentation

## 2012-01-26 DIAGNOSIS — Z79899 Other long term (current) drug therapy: Secondary | ICD-10-CM | POA: Insufficient documentation

## 2012-01-26 DIAGNOSIS — E785 Hyperlipidemia, unspecified: Secondary | ICD-10-CM | POA: Insufficient documentation

## 2012-01-26 DIAGNOSIS — Z940 Kidney transplant status: Secondary | ICD-10-CM | POA: Insufficient documentation

## 2012-01-26 DIAGNOSIS — F3289 Other specified depressive episodes: Secondary | ICD-10-CM | POA: Insufficient documentation

## 2012-01-26 DIAGNOSIS — E11319 Type 2 diabetes mellitus with unspecified diabetic retinopathy without macular edema: Secondary | ICD-10-CM | POA: Insufficient documentation

## 2012-01-26 DIAGNOSIS — Z886 Allergy status to analgesic agent status: Secondary | ICD-10-CM | POA: Insufficient documentation

## 2012-01-26 DIAGNOSIS — F329 Major depressive disorder, single episode, unspecified: Secondary | ICD-10-CM | POA: Insufficient documentation

## 2012-01-26 DIAGNOSIS — Y841 Kidney dialysis as the cause of abnormal reaction of the patient, or of later complication, without mention of misadventure at the time of the procedure: Secondary | ICD-10-CM | POA: Insufficient documentation

## 2012-01-26 DIAGNOSIS — N186 End stage renal disease: Secondary | ICD-10-CM | POA: Insufficient documentation

## 2012-01-26 DIAGNOSIS — T82898A Other specified complication of vascular prosthetic devices, implants and grafts, initial encounter: Secondary | ICD-10-CM | POA: Insufficient documentation

## 2012-01-26 DIAGNOSIS — K219 Gastro-esophageal reflux disease without esophagitis: Secondary | ICD-10-CM | POA: Insufficient documentation

## 2012-01-26 DIAGNOSIS — E1039 Type 1 diabetes mellitus with other diabetic ophthalmic complication: Secondary | ICD-10-CM | POA: Insufficient documentation

## 2012-01-26 DIAGNOSIS — I120 Hypertensive chronic kidney disease with stage 5 chronic kidney disease or end stage renal disease: Secondary | ICD-10-CM | POA: Insufficient documentation

## 2012-01-26 MED ORDER — CEPHALEXIN 500 MG PO CAPS: 500.0000 mg | ORAL_CAPSULE | Freq: Four times a day (QID) | ORAL | Status: AC

## 2012-01-26 NOTE — ED Notes (Signed)
Pt reports had dialysis access placed Tuesday. Pt has noticed swelling and drainage x 3 days. Pt talked with dialysis Dr and was told to come to ED for further eval.

## 2012-01-26 NOTE — Progress Notes (Addendum)
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  As this is likely this patient's last arm access, I think it is worth seeing if the swelling will resolve with time, rather than immediately removing the AVG.  I will arrange follow up for 11 SEP 13 with Dr. Edilia Bo in the office.  Due to the compromised skin closure and prosthetic graft in incision, I would start patient on Keflex 500 mg PO QID hours x 10 days for some Gram + coverage.  There is no reason to assume seeding with a Gram - organism.    Subjective   C/o swelling and pain in leg arm since surgery, no recorded fevers to date  Objective Filed Vitals:   01/26/12 1538  BP: 121/68  Pulse: 87  Temp: 99.3 F (37.4 C)  TempSrc: Oral  Resp: 18  SpO2: 92%   No intake or output data in the 24 hours ending 01/26/12 1742  PULM  CTAB CV  RRR GI  soft, NTND VASC  L arm: entire arm swollen, no evidence of hematoma , no evidence of cellulitis, no purulent drainage or smell, hand does not demonstrate any steal findings, sensation intact with good hand grip,  Axillary incision is intact, antecubital incision demonstrates a small break in skin closure with serous drainage  Laboratory CBC    Component Value Date/Time   WBC 8.4 08/28/2011 2030   HGB 13.3 01/22/2012 0646   HCT 39.0 01/22/2012 0646   PLT 316 08/28/2011 2030    BMET    Component Value Date/Time   NA 131* 01/22/2012 0646   K 5.0 01/22/2012 0646   CL 105 11/12/2011 0920   CO2 19 08/28/2011 2030   GLUCOSE 192* 01/22/2012 0850   BUN 56* 11/12/2011 0920   CREATININE 9.10* 11/12/2011 0920   CALCIUM 8.6 08/28/2011 2030   CALCIUM 8.4 03/06/2011 0751   GFRNONAA 7* 08/28/2011 2030   GFRAA 8* 08/28/2011  2030    Leonides Sake, MD [facility] [place] Office: [phone] Pager: [phone]  01/26/2012, 5:42 PM

## 2012-01-26 NOTE — ED Provider Notes (Addendum)
History     CSN: [id number]  Arrival date & time 01/26/12  1522   First MD Initiated Contact with Patient 01/26/12 1650      Chief Complaint  Patient presents with  . Vascular Access Problem    left arm    (Consider location/radiation/quality/duration/timing/severity/associated sxs/prior treatment) The history is provided by the patient.   patient here with pain and drainage at her vascular graft site that was placed 4 days ago. States possible low-grade fever. Drainage was described as yellow. Some numbness to her left hand. Went to dialysis today and told the dialysis staff and he called her vascular surgeon, Dr. Imogene Burn, and the patient was told to come here for evaluation  Past Medical History  Diagnosis Date  . Hyperlipidemia   . Alopecia   . Obesity   . Iron deficiency   . 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 RLS (restless legs syndrome)   . Respiratory failure   . Injury of conjunctiva and corneal abrasion of right eye without foreign body   . Cellulitis   . Dialysis patient     Tues; Thurs; Sat; [place]  . Angina   . 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 Anemia   . Pneumonia 2012    "double"  . Blood transfusion 2004    "when I had my transplant"  . Migraine   . 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 Hyperparathyroidism, secondary   . Diabetic retinopathy   . Hypertension     takes Metoprolol and Exforge daily  . Depression     takes Klonopin nightly  . Diabetes mellitus type 1     Pt states diagnosed at age 70 with prior episodes of DKA. S/p pancreatic transplant   . GERD (gastroesophageal reflux disease)     takes Protonix daily  . PONV (postoperative nausea and vomiting)   . Bronchitis   . Migraine     Past Surgical History  Procedure Date  . 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 Thyroglobulin     x 7  . Av fistula placement     right upper arm  . Cholecystectomy 1995  .  hd graft placement/removal     "had 2 in my left upper arm"  . Eye surgery   . Retinopathy surgery   . Tooth extraction 10/10/11    X's two  . Insertion of dialysis catheter 12/08/2011    Procedure: INSERTION OF DIALYSIS CATHETER;  Surgeon: Chuck Hint, MD;  Location: [facility] OR;  Service: Vascular;  Laterality: Left;  . Av fistula placement 01/22/2012    Procedure: INSERTION OF ARTERIOVENOUS (AV) GORE-TEX GRAFT ARM;  Surgeon: Chuck Hint, MD;  Location: [facility] OR;  Service: Vascular;  Laterality: Left;  Ultrasound guided.    No family history on file.  History  Substance Use Topics  . Smoking status: Never Smoker   . Smokeless tobacco: Never Used  . Alcohol Use: No    OB History    Grav Para Term Preterm Abortions TAB SAB Ect Mult Living                  Review of Systems  All other systems reviewed and are negative.    Allergies  Morphine and related; Tramadol; and Vicodin  Home Medications  Current Outpatient Rx  Name Route Sig Dispense Refill  . ALBUTEROL SULFATE HFA 108 (90 BASE) MCG/ACT IN AERS Inhalation Inhale 2 puffs into the lungs every 4 (four) hours as needed. For wheezing.     Marland Kitchen AMLODIPINE BESYLATE-VALSARTAN 10-160 MG PO TABS Oral Take 1 tablet by mouth daily.    . ASPIRIN EC 81 MG PO TBEC Oral Take 81 mg by mouth daily.    Marland Kitchen CALCIUM ACETATE 667 MG PO CAPS Oral Take 1,334-2,001 mg by mouth 3 (three) times daily with meals. Dosage depends on meal consumed    . CLONAZEPAM 0.5 MG PO TABS Oral Take 2 mg by mouth at bedtime.     Marland Kitchen HYDROMORPHONE HCL 2 MG PO TABS Oral Take 1 tablet (2 mg total) by mouth every 6 (six) hours as needed for pain. 20 tablet 0  . INSULIN ASPART 100 UNIT/ML [place] SOLN Subcutaneous Inject 5-8 Units into the skin 3 (three) times daily before meals. Inject 3 units three times daily, in addition to sliding scale.    . INSULIN GLARGINE  100 UNIT/ML [place] SOLN Subcutaneous Inject 7 Units into the skin 2 (two) times daily.    Marland Kitchen METOPROLOL SUCCINATE ER 100 MG PO TB24 Oral Take 100 mg by mouth 4 (four) times a week. Do not take the night before dialysis. Dialysis on Tues, Thurs & Sat.    Marland Kitchen MYCOPHENOLATE MOFETIL 250 MG PO CAPS Oral Take 250 mg by mouth 2 (two) times daily.     Marland Kitchen PANTOPRAZOLE SODIUM 40 MG PO TBEC Oral Take 40 mg by mouth daily.    Marland Kitchen PREDNISONE 5 MG PO TABS Oral Take 1 tablet by mouth daily.    Marland Kitchen TACROLIMUS 1 MG PO CAPS Oral Take 1 mg by mouth 2 (two) times daily.     Marland Kitchen ZOLPIDEM TARTRATE 10 MG PO TABS Oral Take 10 mg by mouth at bedtime as needed. For insomnia.      BP 121/68  Pulse 87  Temp 99.3 F (37.4 C) (Oral)  Resp 18  SpO2 92%  LMP 01/07/2012  Physical Exam  Nursing note and vitals reviewed. Constitutional: She is oriented to person, place, and time. She appears well-developed and well-nourished.  Non-toxic appearance. No distress.  HENT:  Head: Normocephalic and atraumatic.  Eyes: Conjunctivae, EOM and lids are normal. Pupils are equal, round, and reactive to light.  Neck: Normal range of motion. Neck supple. No tracheal deviation present. No mass present.  Cardiovascular: Normal rate, regular rhythm and normal heart sounds.  Exam reveals no gallop.   No murmur heard. Pulmonary/Chest: Effort normal and breath sounds normal. No stridor. No respiratory distress. She has no decreased breath sounds. She has no wheezes. She has no rhonchi. She has no rales.  Abdominal: Soft. Normal appearance and bowel sounds are normal. She exhibits no distension. There is no tenderness. There is no rebound and no CVA tenderness.  Musculoskeletal: Normal range of motion. She exhibits no edema and no tenderness.       Left forearm with surgical incision site at the antecubital fossa without drainage or erythema. Faint thrill appreciated radial pulse palpable. No active drainage noted.  Neurological: She is alert and oriented  to person, place, and time. She has normal strength. No cranial nerve deficit or sensory deficit. GCS eye subscore is 4. GCS verbal subscore is 5. GCS motor subscore is 6.  Skin: Skin is warm and dry. No abrasion and no rash noted.  Psychiatric: She has a normal mood  and affect. Her speech is normal and behavior is normal.    ED Course  Procedures (including critical care time)  Labs Reviewed - No data to display No results found.   No diagnosis found.    MDM  Patient to be seen by vascular surgery here   5:56 PM Spoke with dr. Imogene Burn, requests pt to be placed on keflex and he will see in f/u     Toy Baker, MD 01/26/12 1703  Toy Baker, MD 01/26/12 [phone]

## 2012-01-26 NOTE — ED Notes (Signed)
Reports she is dialysis on Tuesday, Thursday, Saturday.

## 2012-01-29 ENCOUNTER — Telehealth: Payer: Self-pay | Admitting: Vascular Surgery

## 2012-01-29 NOTE — Telephone Encounter (Addendum)
Message copied by Rosalyn Charters on Tue Jan 29, 2012 11:23 AM ------      Message from: Melene Plan      Created: Tue Jan 29, 2012 10:25 AM                   ----- Message -----         From: Fransisco Hertz, MD         Sent: 01/26/2012   5:50 PM           To: Melene Plan, RN            [name]      [id number]      1970-06-17            Pt needs follow-up with Dr. 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 with dr. Edilia Bo on 02-06-12 at 12:45 l/v/m and mailed appt. letter

## 2012-02-05 ENCOUNTER — Encounter: Payer: Self-pay | Admitting: Vascular Surgery

## 2012-02-06 ENCOUNTER — Encounter: Payer: Self-pay | Admitting: Vascular Surgery

## 2012-02-06 ENCOUNTER — Ambulatory Visit (INDEPENDENT_AMBULATORY_CARE_PROVIDER_SITE_OTHER): Payer: Medicare Other | Admitting: Vascular Surgery

## 2012-02-06 VITALS — BP 186/100 | HR 86 | Resp 18 | Ht 61.0 in | Wt 150.0 lb

## 2012-02-06 DIAGNOSIS — N186 End stage renal disease: Secondary | ICD-10-CM

## 2012-02-06 MED ORDER — OXYCODONE-ACETAMINOPHEN 5-325 MG PO TABS: 1.0000 | ORAL_TABLET | ORAL | Status: AC | PRN

## 2012-02-06 NOTE — Progress Notes (Signed)
[facility] [place]  Patient name: [name] MRN: [id number] DOB: 08/25/1970 Sex: female  REASON FOR VISIT: left arm swelling  HPI: [name] is a 41 y.o. female who has a very complicated vascular access history. She is exhausted her options the right arm. She's had previous upper arm and forearm graft in the left side and we were hoping to place a basilic vein transposition on the left however the vein was occluded distally. I was able to find adjacent axillary vein very high in the axilla on the left for a left AV graft. The artery was very small also. After surgery she developed swelling in the left arm which has persisted and is painful. She has been elevating her arm and this does help but is any she's up the arm hurts significantly. His had no fever or chills.   REVIEW OF SYSTEMS: [X ] denotes positive finding; [  ] denotes negative finding  CARDIOVASCULAR:  [ ] chest pain   [ ] dyspnea on exertion    CONSTITUTIONAL:  [ ] fever   [ ] chills  PHYSICAL EXAM: Filed Vitals:   02/06/12 1330  BP: 186/100  Pulse: 86  Resp: 18  Height: 5' 1" (1.549 m)  Weight: 150 lb (68.04 kg)   Body mass index is 28.34 kg/(m^2). GENERAL: The patient is a well-nourished female, in no acute distress. The vital signs are documented above. CARDIOVASCULAR: There is a regular rate and rhythm  PULMONARY: There is good air exchange bilaterally without wheezing or rales. Her left upper arm graft has an excellent bruit and thrill. Her incisions are healing. She is moderate left upper extremity swelling to her shoulder.  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 However she is only 2 weeks postop and it is too early to cannulate her graft. I've given a prescription for Percocet for pain so that she can continue to ride this out so that we can schedule a fistulogram in 2 weeks and evaluate her for a central venous stenosis. If she has evidence of a central  venous stenosis this could potentially be addressed with venoplasty. If this is not possible she would likely require ligation of her graft and consideration for placement of a thigh graft. She currently has a left IJ Diatek catheter.  Birtie Fellman S [facility] [place] Beeper: [phone]     

## 2012-02-07 ENCOUNTER — Other Ambulatory Visit: Payer: Self-pay

## 2012-02-18 IMAGING — CT CT ABD-PELV W/O CM
2 of 4 series · 14 of 32 positions shown, 19 images · non-contrast
Comparison: CT abdomen pelvis 12/17/2008.

CLINICAL DATA: Right flank pain.

CT ABDOMEN AND PELVIS WITHOUT CONTRAST
TECHNIQUE: Multidetector CT imaging of the abdomen and pelvis was
performed following the standard protocol without intravenous
contrast.

[Series 2: renal stone · axial · 0.67mm/px · z∈[-370,-70]mm · 7 of 81 slices shown, 12 images]
[im 11/81  soft-tissue]
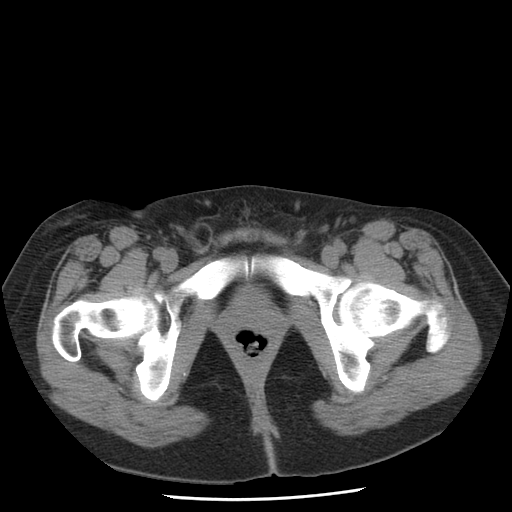
[im 11/81  bone]
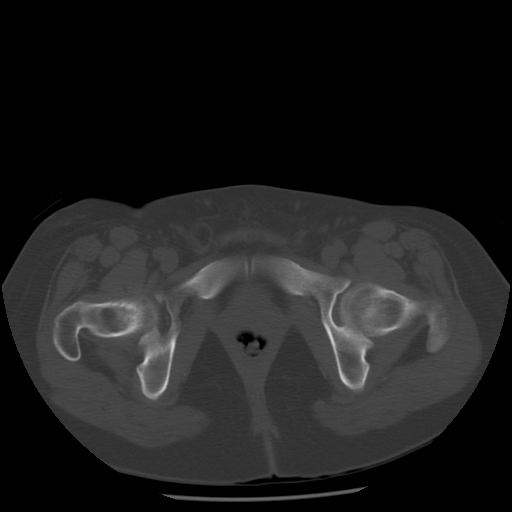
[im 21/81  soft-tissue]
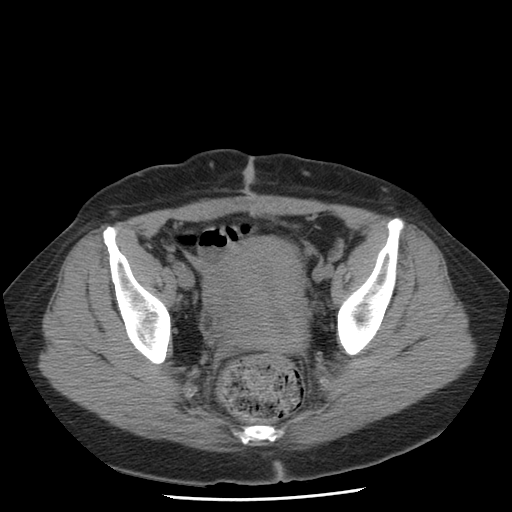
[im 31/81  soft-tissue]
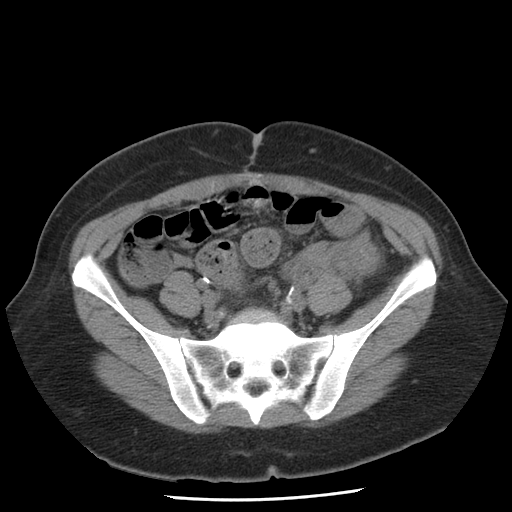
[im 41/81  soft-tissue]
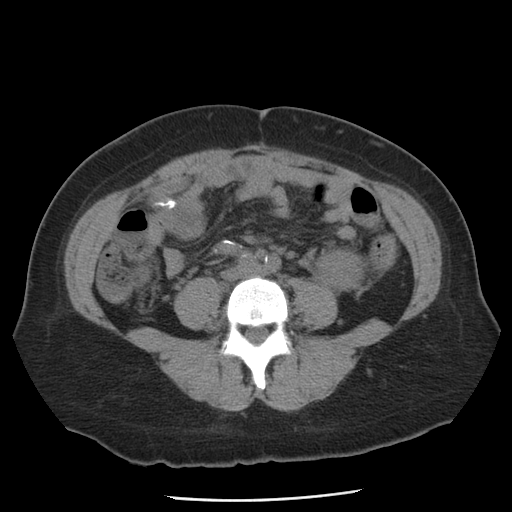
[im 41/81  lung]
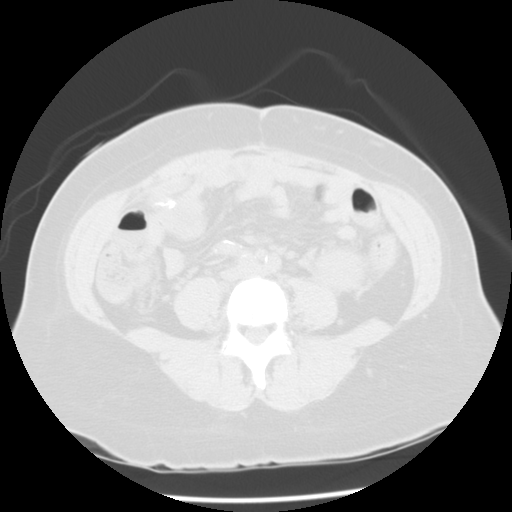
[im 51/81  soft-tissue]
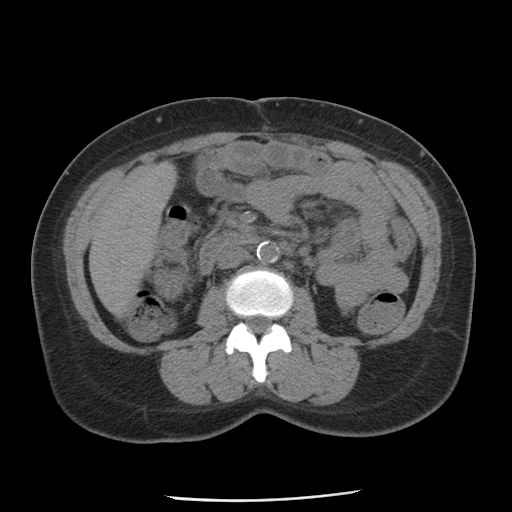
[im 51/81  lung]
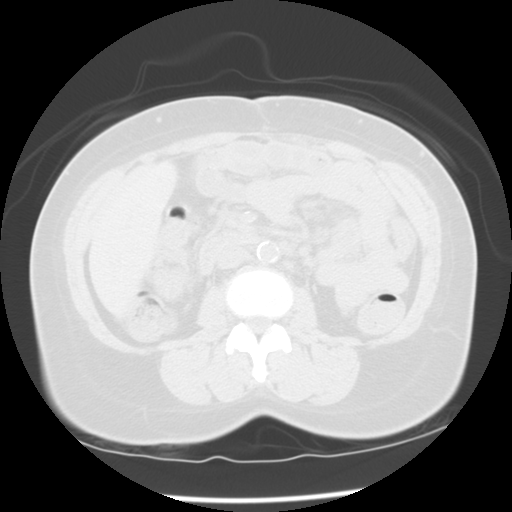
[im 61/81  soft-tissue]
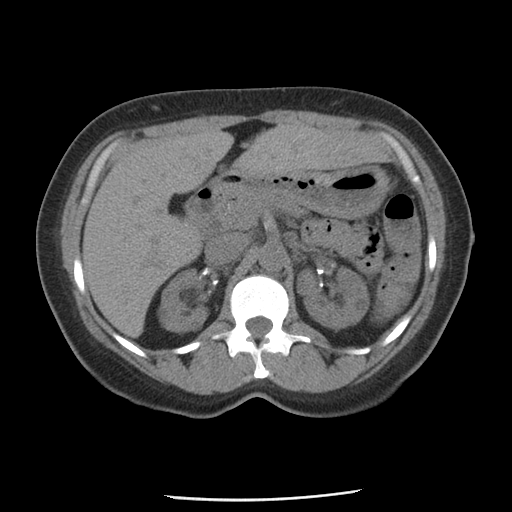
[im 61/81  lung]
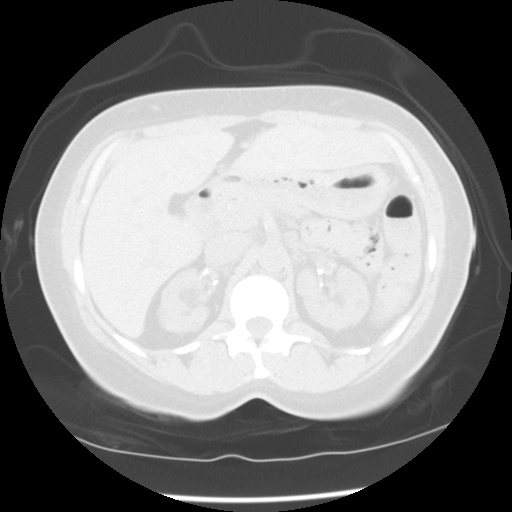
[im 71/81  soft-tissue]
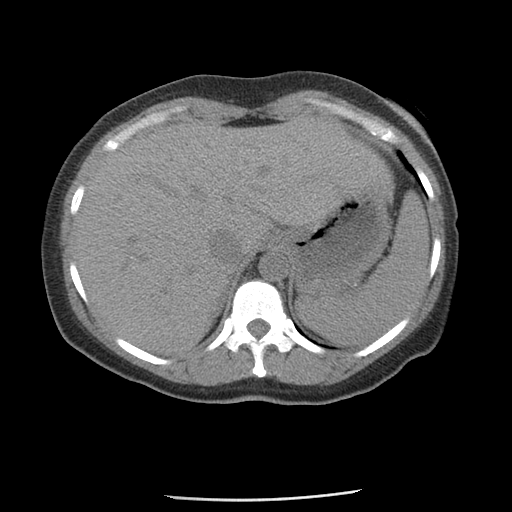
[im 71/81  lung]
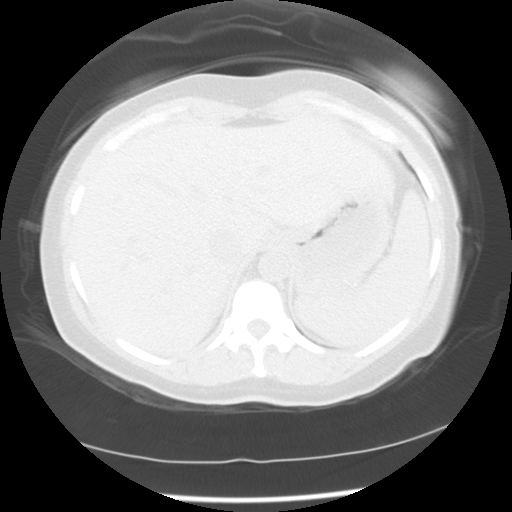

[Series 400: sag · sagittal · 0.84mm/px · 7 of 108 slices shown]
[im 11/108  soft-tissue]
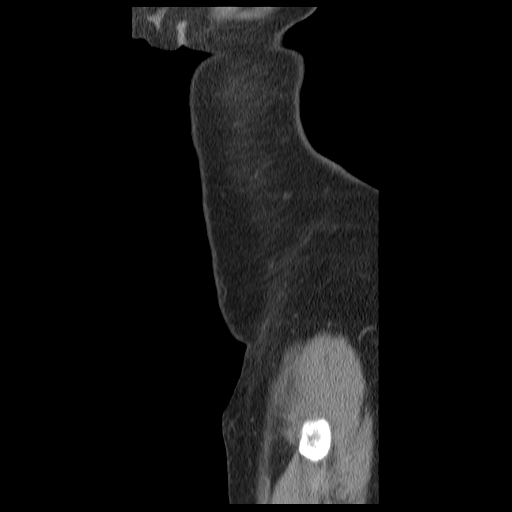
[im 22/108  soft-tissue]
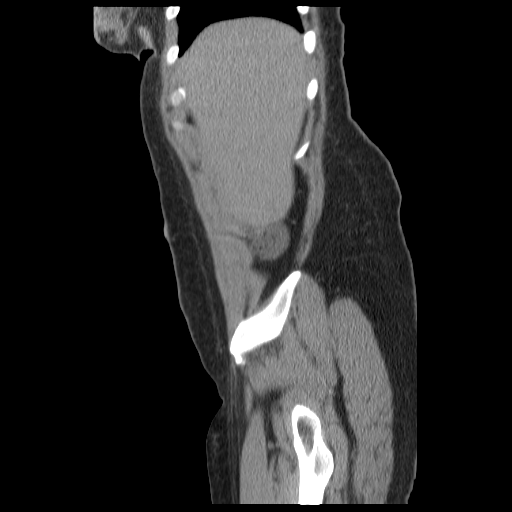
[im 33/108  soft-tissue]
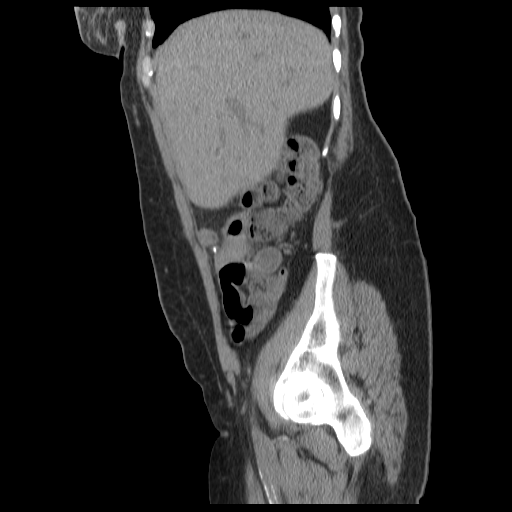
[im 43/108  soft-tissue]
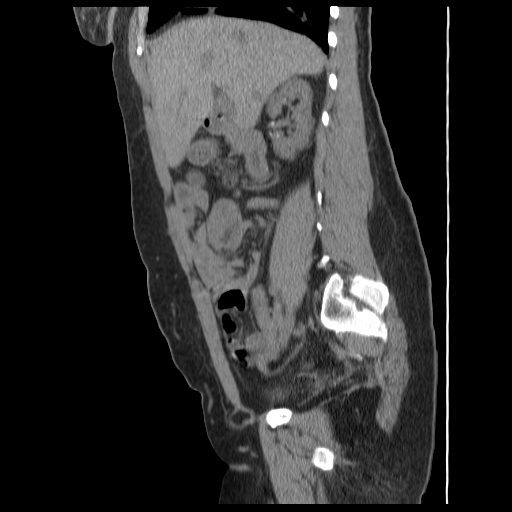
[im 65/108  soft-tissue]
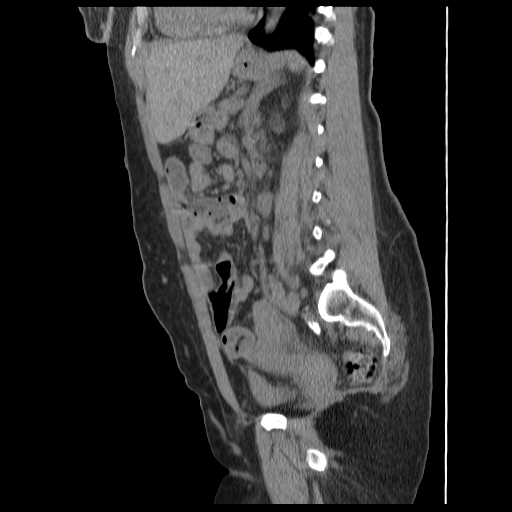
[im 75/108  soft-tissue]
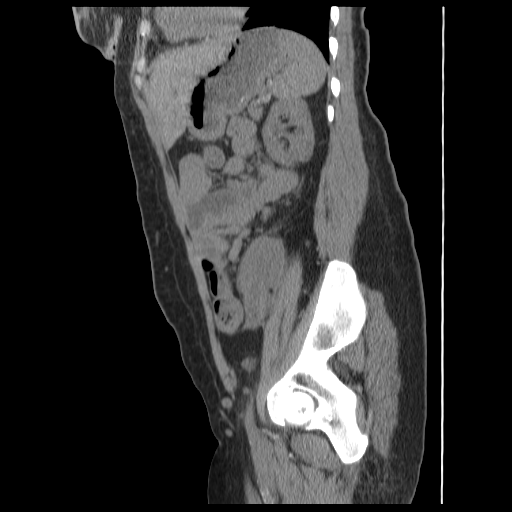
[im 86/108  soft-tissue]
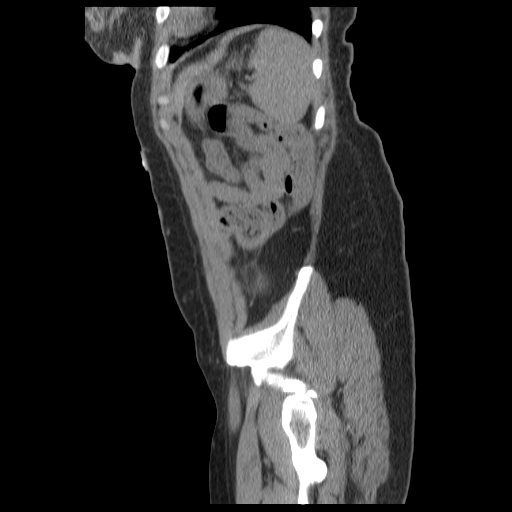

[14 of 32 positions shown; findings below may reference images not displayed]

FINDINGS: The lung bases are clear.  No pleural or pericardial
effusion.

The kidneys are atrophic with cystic change and calcifications
consistent with chronic renal disease.  The appearance is
unchanged.  The patient is status post cholecystectomy.  The
uninfused appearance of the liver, spleen, adrenal glands and
pancreas is normal.  Transplanted kidney in the left lower quadrant
is identified.  There is no surrounding fluid collection and no
hydronephrosis.  Uterus, adnexa and urinary bladder are
unremarkable. Stomach and small bowel are unremarkable.  Moderate
stool burden is noted.  The colon is otherwise normal appearance.
Appendix appears normal.  No focal bony abnormality.  No
lymphadenopathy or fluid.
IMPRESSION: 1.  No acute finding.
2.  Left lower quadrant renal transplant noted.  There is no
surrounding fluid collection or hydronephrosis.
3.  Moderate stool burden.

## 2012-02-19 IMAGING — CR DG CHEST 2V
2 series · 2 of 2 positions shown · non-contrast
Comparison: 08/03/2009.

CLINICAL DATA: Chest pain.

CHEST - 2 VIEW

[w chest pa]
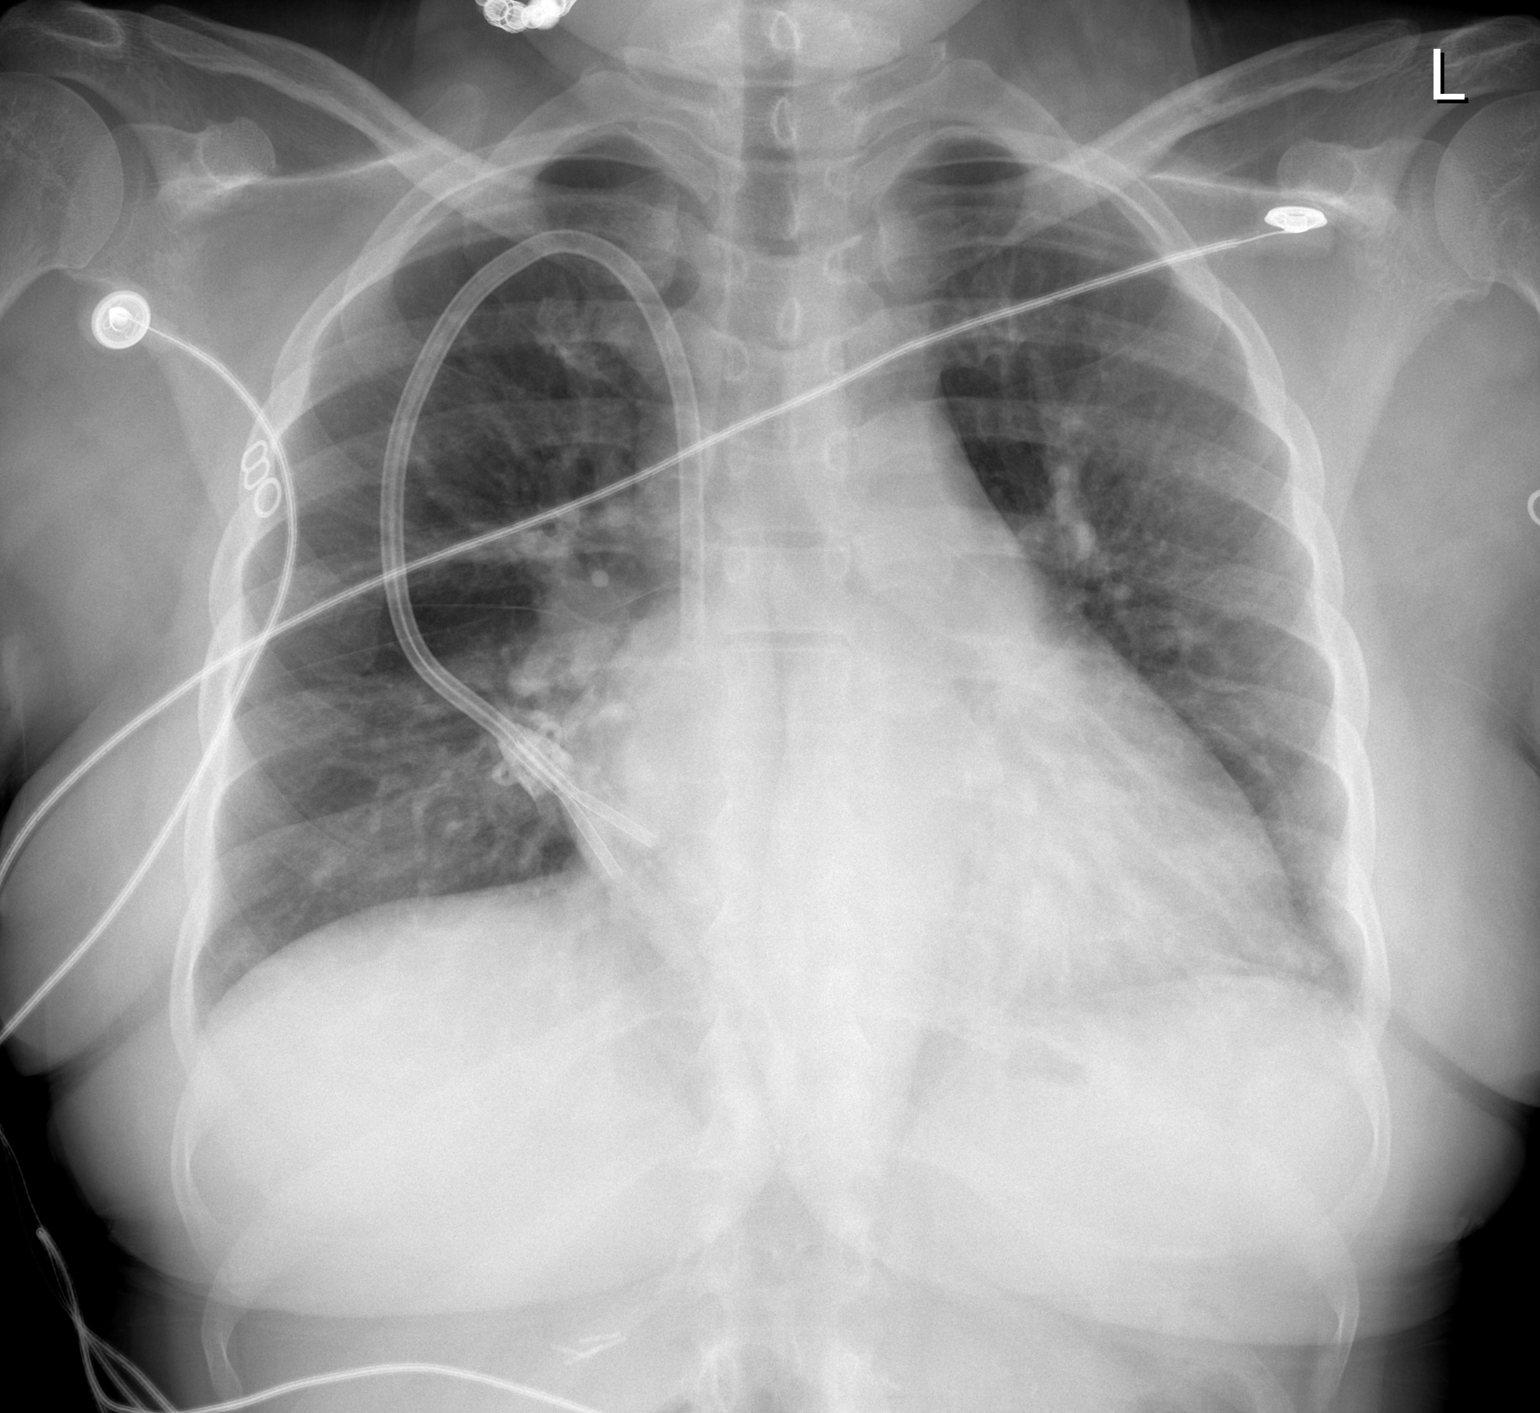

[w chest lat]
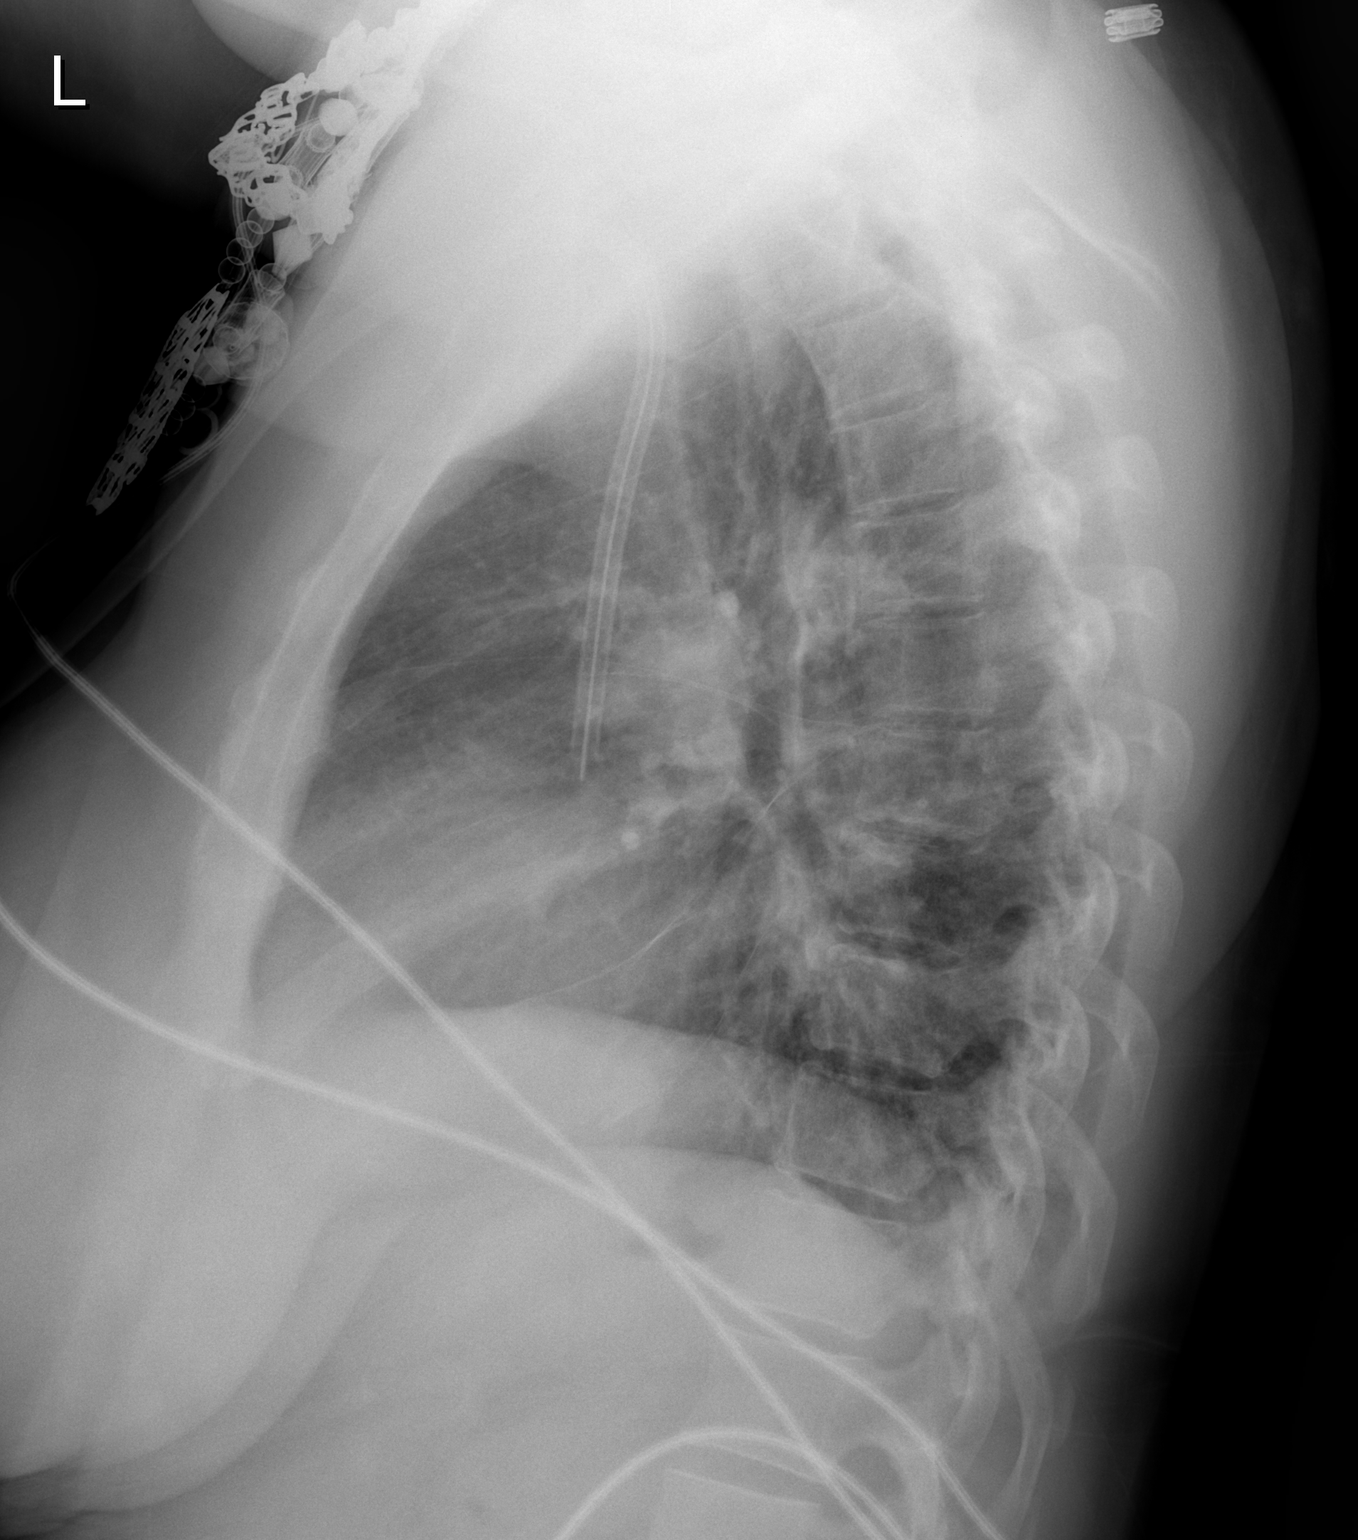

[2 of 2 positions shown; findings below may reference images not displayed]

FINDINGS: A right IJ dialysis catheter terminates at the cavoatrial
junction.  The heart is mildly enlarged.  The lung volumes are low
with minimal bibasilar atelectasis.  No focal airspace disease is
evident otherwise.  The visualized soft tissues and bony thorax are
unremarkable.
IMPRESSION: 1.  Low lung volumes and bibasilar atelectasis.
2.  Borderline cardiomegaly.
3.  Stable dialysis catheter.

## 2012-02-28 ENCOUNTER — Encounter (HOSPITAL_COMMUNITY): Payer: Self-pay | Admitting: Pharmacy Technician

## 2012-03-03 ENCOUNTER — Encounter (HOSPITAL_COMMUNITY)
Admission: RE | Disposition: A | Discharge status: Home / Self Care | Payer: Self-pay | Source: Ambulatory Visit | Attending: Vascular Surgery

## 2012-03-03 ENCOUNTER — Other Ambulatory Visit: Payer: Self-pay | Admitting: *Deleted

## 2012-03-03 ENCOUNTER — Ambulatory Visit (HOSPITAL_COMMUNITY)
Admission: RE | Admit: 2012-03-03 | Discharge: 2012-03-03 | Disposition: A | Discharge status: Home / Self Care | Payer: Medicare Other | Source: Ambulatory Visit | Attending: Vascular Surgery | Admitting: Vascular Surgery

## 2012-03-03 DIAGNOSIS — M7989 Other specified soft tissue disorders: Secondary | ICD-10-CM | POA: Insufficient documentation

## 2012-03-03 DIAGNOSIS — T82898A Other specified complication of vascular prosthetic devices, implants and grafts, initial encounter: Secondary | ICD-10-CM

## 2012-03-03 DIAGNOSIS — Y832 Surgical operation with anastomosis, bypass or graft as the cause of abnormal reaction of the patient, or of later complication, without mention of misadventure at the time of the procedure: Secondary | ICD-10-CM | POA: Insufficient documentation

## 2012-03-03 DIAGNOSIS — I871 Compression of vein: Secondary | ICD-10-CM | POA: Insufficient documentation

## 2012-03-03 HISTORY — PX: FISTULOGRAM: SHX5832

## 2012-03-03 LAB — POCT I-STAT, CHEM 8
BUN: 79 mg/dL — ABNORMAL HIGH (ref 6–23)
Creatinine, Ser: 8.7 mg/dL — ABNORMAL HIGH (ref 0.50–1.10)
HCT: 39 % (ref 36.0–46.0)
Hemoglobin: 13.3 g/dL (ref 12.0–15.0)

## 2012-03-03 LAB — GLUCOSE, CAPILLARY: Glucose-Capillary: 153 mg/dL — ABNORMAL HIGH (ref 70–99)

## 2012-03-03 LAB — HCG, SERUM, QUALITATIVE: Preg, Serum: NEGATIVE

## 2012-03-03 SURGERY — FISTULOGRAM
Anesthesia: LOCAL | Laterality: Left

## 2012-03-03 MED ORDER — SODIUM CHLORIDE 0.9 % IJ SOLN: 3.0000 mL | INTRAMUSCULAR | Status: DC | PRN

## 2012-03-03 NOTE — H&P (View-Only) (Signed)
[facility]ist of [place]  Patient name: [name] MRN: [id number] DOB: 01/17/1971 Sex: female  REASON FOR VISIT: left arm swelling  HPI: [name] is a 41 y.o. female who has a very complicated vascular access history. She is exhausted her options the right arm. She's had previous upper arm and forearm graft in the left side and we were hoping to place a basilic vein transposition on the left however the vein was occluded distally. I was able to find adjacent axillary vein very high in the axilla on the left for a left AV graft. The artery was very small also. After surgery she developed swelling in the left arm which has persisted and is painful. She has been elevating her arm and this does help but is any she's up the arm hurts significantly. His had no fever or chills.   REVIEW OF SYSTEMS: Arly.Keller ] denotes positive finding; [  ] denotes negative finding  CARDIOVASCULAR:  [ ]  chest pain   [ ]  dyspnea on exertion    CONSTITUTIONAL:  [ ]  fever   [ ]  chills  PHYSICAL EXAM: Filed Vitals:   02/06/12 1330  BP: 186/100  Pulse: 86  Resp: 18  Height: 5\' 1"  (1.549 m)  Weight: 150 lb (68.04 kg)   Body mass index is 28.34 kg/(m^2). GENERAL: The patient is a well-nourished female, in no acute distress. The vital signs are documented above. CARDIOVASCULAR: There is a regular rate and rhythm  PULMONARY: There is good air exchange bilaterally without wheezing or rales. Her left upper arm graft has an excellent bruit and thrill. Her incisions are healing. She is moderate left upper extremity swelling to her shoulder.  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 However she is only 2 weeks postop and it is too early to cannulate her graft. I've given a prescription for Percocet for pain so that she can continue to ride this out so that we can schedule a fistulogram in 2 weeks and evaluate her for a central venous stenosis. If she has evidence of a central  venous stenosis this could potentially be addressed with venoplasty. If this is not possible she would likely require ligation of her graft and consideration for placement of a thigh graft. She currently has a left IJ Diatek catheter.  Laurita Peron S [facility] [place] Beeper: [phone]

## 2012-03-03 NOTE — Op Note (Signed)
PATIENT: [name]   MRN: [id number] DOB: August 09, 1970    DATE OF PROCEDURE: 03/03/2012  INDICATIONS: [name] is a 41 y.o. female who had a new left upper arm AV graft placed as her only remaining option for upper extremity access. She developed swelling in her left arm postoperatively and presents for a fistulogram in order to look for a central venous stenosis.  PROCEDURE:  1. Ultrasound access to her left AV graft 2. shuntogram left upper arm AV graft  SURGEON: Di Kindle. Edilia Bo, MD, FACS  ANESTHESIA: local   EBL: minimal  TECHNIQUE: Patient was brought to the peripheral vascular lab and the left arm was prepped and draped in the usual sterile fashion. After the skin was anesthetized, and under ultrasound guidance, the left upper arm graft was cannulated with a micropuncture needle and micropuncture sheath was introduced over the wire. Fistulogram was then obtained. This demonstrated occlusion of the left innominate vein. A 4-0 Monocryl suture was placed around the sheath and good hemostasis was obtained.  FINDINGS:  1. Occluded left innominate vein 2. Patent left upper arm AV graft  Waverly Ferrari, MD, FACS [facility]  DATE OF DICTATION:   03/03/2012

## 2012-03-03 NOTE — Interval H&P Note (Signed)
History and Physical Interval Note:  03/03/2012 11:03 AM  [name]  has presented today for surgery, with the diagnosis of pvd  The various methods of treatment have been discussed with the patient and family. After consideration of risks, benefits and other options for treatment, the patient has consented to shuntogram and possible venoplasty left arm.The patient's history has been reviewed, patient examined, no change in status, stable for surgery.  I have reviewed the patient's chart and labs.  Questions were answered to the patient's satisfaction.     Hollin Crewe S

## 2012-03-12 ENCOUNTER — Encounter (HOSPITAL_COMMUNITY): Payer: Self-pay

## 2012-03-13 MED ORDER — DEXTROSE 5 % IV SOLN
1.5000 g | INTRAVENOUS | Status: AC
  Administered 2012-03-14: 1.5 g via INTRAVENOUS
  Filled 2012-03-13: qty 1.5

## 2012-03-14 ENCOUNTER — Ambulatory Visit (HOSPITAL_COMMUNITY): Payer: Medicare Other

## 2012-03-14 ENCOUNTER — Encounter (HOSPITAL_COMMUNITY)
Admission: RE | Disposition: A | Discharge status: Home / Self Care | Payer: Self-pay | Source: Ambulatory Visit | Attending: Vascular Surgery

## 2012-03-14 ENCOUNTER — Encounter (HOSPITAL_COMMUNITY): Payer: Self-pay | Admitting: Anesthesiology

## 2012-03-14 ENCOUNTER — Ambulatory Visit (HOSPITAL_COMMUNITY)
Admission: RE | Admit: 2012-03-14 | Discharge: 2012-03-15 | Disposition: A | Discharge status: Home / Self Care | Payer: Medicare Other | Source: Ambulatory Visit | Attending: Vascular Surgery | Admitting: Vascular Surgery

## 2012-03-14 ENCOUNTER — Ambulatory Visit (HOSPITAL_COMMUNITY): Payer: Medicare Other | Admitting: Anesthesiology

## 2012-03-14 ENCOUNTER — Telehealth: Payer: Self-pay | Admitting: Vascular Surgery

## 2012-03-14 DIAGNOSIS — I120 Hypertensive chronic kidney disease with stage 5 chronic kidney disease or end stage renal disease: Secondary | ICD-10-CM | POA: Insufficient documentation

## 2012-03-14 DIAGNOSIS — N186 End stage renal disease: Secondary | ICD-10-CM | POA: Insufficient documentation

## 2012-03-14 DIAGNOSIS — Z992 Dependence on renal dialysis: Secondary | ICD-10-CM | POA: Insufficient documentation

## 2012-03-14 DIAGNOSIS — E1029 Type 1 diabetes mellitus with other diabetic kidney complication: Secondary | ICD-10-CM | POA: Insufficient documentation

## 2012-03-14 DIAGNOSIS — N2581 Secondary hyperparathyroidism of renal origin: Secondary | ICD-10-CM | POA: Insufficient documentation

## 2012-03-14 DIAGNOSIS — IMO0002 Reserved for concepts with insufficient information to code with codable children: Secondary | ICD-10-CM | POA: Insufficient documentation

## 2012-03-14 DIAGNOSIS — F3289 Other specified depressive episodes: Secondary | ICD-10-CM | POA: Insufficient documentation

## 2012-03-14 DIAGNOSIS — Z940 Kidney transplant status: Secondary | ICD-10-CM | POA: Insufficient documentation

## 2012-03-14 DIAGNOSIS — Z79899 Other long term (current) drug therapy: Secondary | ICD-10-CM | POA: Insufficient documentation

## 2012-03-14 DIAGNOSIS — J45909 Unspecified asthma, uncomplicated: Secondary | ICD-10-CM | POA: Insufficient documentation

## 2012-03-14 DIAGNOSIS — E669 Obesity, unspecified: Secondary | ICD-10-CM | POA: Insufficient documentation

## 2012-03-14 DIAGNOSIS — E1039 Type 1 diabetes mellitus with other diabetic ophthalmic complication: Secondary | ICD-10-CM | POA: Insufficient documentation

## 2012-03-14 DIAGNOSIS — G43909 Migraine, unspecified, not intractable, without status migrainosus: Secondary | ICD-10-CM | POA: Insufficient documentation

## 2012-03-14 DIAGNOSIS — K219 Gastro-esophageal reflux disease without esophagitis: Secondary | ICD-10-CM | POA: Insufficient documentation

## 2012-03-14 DIAGNOSIS — F329 Major depressive disorder, single episode, unspecified: Secondary | ICD-10-CM | POA: Insufficient documentation

## 2012-03-14 DIAGNOSIS — Z9483 Pancreas transplant status: Secondary | ICD-10-CM | POA: Insufficient documentation

## 2012-03-14 DIAGNOSIS — G2581 Restless legs syndrome: Secondary | ICD-10-CM | POA: Insufficient documentation

## 2012-03-14 DIAGNOSIS — Y832 Surgical operation with anastomosis, bypass or graft as the cause of abnormal reaction of the patient, or of later complication, without mention of misadventure at the time of the procedure: Secondary | ICD-10-CM | POA: Insufficient documentation

## 2012-03-14 DIAGNOSIS — Z794 Long term (current) use of insulin: Secondary | ICD-10-CM | POA: Insufficient documentation

## 2012-03-14 DIAGNOSIS — E11319 Type 2 diabetes mellitus with unspecified diabetic retinopathy without macular edema: Secondary | ICD-10-CM | POA: Insufficient documentation

## 2012-03-14 DIAGNOSIS — T82898A Other specified complication of vascular prosthetic devices, implants and grafts, initial encounter: Secondary | ICD-10-CM | POA: Insufficient documentation

## 2012-03-14 DIAGNOSIS — E785 Hyperlipidemia, unspecified: Secondary | ICD-10-CM | POA: Insufficient documentation

## 2012-03-14 HISTORY — PX: AV FISTULA PLACEMENT: SHX1204

## 2012-03-14 HISTORY — DX: Unspecified asthma, uncomplicated: J45.909

## 2012-03-14 LAB — GLUCOSE, CAPILLARY
Glucose-Capillary: 387 mg/dL — ABNORMAL HIGH (ref 70–99)
Glucose-Capillary: 279 mg/dL — ABNORMAL HIGH (ref 70–99)
Glucose-Capillary: 59 mg/dL — ABNORMAL LOW (ref 70–99)
Glucose-Capillary: 61 mg/dL — ABNORMAL LOW (ref 70–99)
Glucose-Capillary: 51 mg/dL — ABNORMAL LOW (ref 70–99)

## 2012-03-14 LAB — POCT I-STAT 4, (NA,K, GLUC, HGB,HCT)
Glucose, Bld: 105 mg/dL — ABNORMAL HIGH (ref 70–99)
HCT: 38 % (ref 36.0–46.0)
Hemoglobin: 12.9 g/dL (ref 12.0–15.0)
Potassium: 4.7 meq/L (ref 3.5–5.1)
Sodium: 138 meq/L (ref 135–145)

## 2012-03-14 LAB — GLUCOSE, RANDOM: Glucose, Bld: 209 mg/dL — ABNORMAL HIGH (ref 70–99)

## 2012-03-14 SURGERY — INSERTION OF ARTERIOVENOUS (AV) GORE-TEX GRAFT THIGH
Anesthesia: General | Site: Leg Upper | Laterality: Right

## 2012-03-14 MED ORDER — INSULIN ASPART 100 UNIT/ML ~~LOC~~ SOLN
5.0000 [IU] | Freq: Once | SUBCUTANEOUS | Status: AC
  Administered 2012-03-14: 5 [IU] via SUBCUTANEOUS

## 2012-03-14 MED ORDER — GUAIFENESIN-DM 100-10 MG/5ML PO SYRP
15.0000 mL | ORAL_SOLUTION | ORAL | Status: DC | PRN
  Filled 2012-03-14: qty 15

## 2012-03-14 MED ORDER — FENTANYL CITRATE 0.05 MG/ML IJ SOLN
INTRAMUSCULAR | Status: DC | PRN
  Administered 2012-03-14: 50 ug via INTRAVENOUS
  Administered 2012-03-14: 25 ug via INTRAVENOUS

## 2012-03-14 MED ORDER — ALBUTEROL SULFATE HFA 108 (90 BASE) MCG/ACT IN AERS
2.0000 | INHALATION_SPRAY | RESPIRATORY_TRACT | Status: DC | PRN
  Administered 2012-03-15: 2 via RESPIRATORY_TRACT
  Filled 2012-03-14: qty 6.7

## 2012-03-14 MED ORDER — HEPARIN SODIUM (PORCINE) 1000 UNIT/ML IJ SOLN
INTRAMUSCULAR | Status: DC | PRN
  Administered 2012-03-14: 6000 [IU] via INTRAVENOUS

## 2012-03-14 MED ORDER — AMLODIPINE BESYLATE-VALSARTAN 10-160 MG PO TABS: 1.0000 | ORAL_TABLET | Freq: Every day | ORAL | Status: DC

## 2012-03-14 MED ORDER — SODIUM CHLORIDE 0.9 % IV SOLN: 100.0000 mL | INTRAVENOUS | Status: DC | PRN

## 2012-03-14 MED ORDER — FENTANYL CITRATE 0.05 MG/ML IJ SOLN
INTRAMUSCULAR | Status: AC
  Filled 2012-03-14: qty 2

## 2012-03-14 MED ORDER — 0.9 % SODIUM CHLORIDE (POUR BTL) OPTIME
TOPICAL | Status: DC | PRN
  Administered 2012-03-14: 1000 mL

## 2012-03-14 MED ORDER — ACETAMINOPHEN 650 MG RE SUPP: 325.0000 mg | RECTAL | Status: DC | PRN

## 2012-03-14 MED ORDER — FENTANYL CITRATE 0.05 MG/ML IJ SOLN
25.0000 ug | INTRAMUSCULAR | Status: DC | PRN
  Administered 2012-03-14 (×2): 50 ug via INTRAVENOUS

## 2012-03-14 MED ORDER — ASPIRIN EC 81 MG PO TBEC
81.0000 mg | DELAYED_RELEASE_TABLET | Freq: Every day | ORAL | Status: DC
  Administered 2012-03-15: 81 mg via ORAL
  Filled 2012-03-14 (×2): qty 1

## 2012-03-14 MED ORDER — DEXTROSE 50 % IV SOLN
25.0000 mL | Freq: Once | INTRAVENOUS | Status: DC | PRN
  Administered 2012-03-14: 50 mL via INTRAVENOUS

## 2012-03-14 MED ORDER — IRBESARTAN 150 MG PO TABS
150.0000 mg | ORAL_TABLET | Freq: Every day | ORAL | Status: DC
  Filled 2012-03-14 (×2): qty 1

## 2012-03-14 MED ORDER — METOPROLOL TARTRATE 1 MG/ML IV SOLN
2.0000 mg | INTRAVENOUS | Status: DC | PRN
  Filled 2012-03-14: qty 5

## 2012-03-14 MED ORDER — MYCOPHENOLATE MOFETIL 250 MG PO CAPS
250.0000 mg | ORAL_CAPSULE | Freq: Two times a day (BID) | ORAL | Status: DC
  Administered 2012-03-14 – 2012-03-15 (×2): 250 mg via ORAL
  Filled 2012-03-14 (×3): qty 1

## 2012-03-14 MED ORDER — ONDANSETRON HCL 4 MG/2ML IJ SOLN
4.0000 mg | Freq: Four times a day (QID) | INTRAMUSCULAR | Status: DC | PRN
  Administered 2012-03-14 – 2012-03-15 (×4): 4 mg via INTRAVENOUS
  Filled 2012-03-14: qty 2

## 2012-03-14 MED ORDER — AMLODIPINE BESYLATE 10 MG PO TABS
10.0000 mg | ORAL_TABLET | Freq: Every day | ORAL | Status: DC
  Filled 2012-03-14 (×2): qty 1

## 2012-03-14 MED ORDER — PREDNISONE 5 MG PO TABS
5.0000 mg | ORAL_TABLET | Freq: Every day | ORAL | Status: DC
  Administered 2012-03-14 – 2012-03-15 (×2): 5 mg via ORAL
  Filled 2012-03-14 (×2): qty 1

## 2012-03-14 MED ORDER — DEXTROSE 50 % IV SOLN
1.0000 | INTRAVENOUS | Status: DC
  Filled 2012-03-14: qty 50

## 2012-03-14 MED ORDER — DEXTROSE 5 % IV SOLN: 1.5000 g | Freq: Two times a day (BID) | INTRAVENOUS | Status: DC

## 2012-03-14 MED ORDER — HYDRALAZINE HCL 20 MG/ML IJ SOLN
10.0000 mg | INTRAMUSCULAR | Status: DC | PRN
  Filled 2012-03-14: qty 0.5

## 2012-03-14 MED ORDER — TACROLIMUS 1 MG PO CAPS
1.0000 mg | ORAL_CAPSULE | Freq: Two times a day (BID) | ORAL | Status: DC
  Administered 2012-03-14 – 2012-03-15 (×2): 1 mg via ORAL
  Filled 2012-03-14 (×3): qty 1

## 2012-03-14 MED ORDER — NEPRO/CARBSTEADY PO LIQD: 237.0000 mL | ORAL | Status: DC | PRN

## 2012-03-14 MED ORDER — PARICALCITOL 5 MCG/ML IV SOLN
3.0000 ug | INTRAVENOUS | Status: DC
  Administered 2012-03-15: 3 ug via INTRAVENOUS
  Filled 2012-03-14: qty 0.6

## 2012-03-14 MED ORDER — THROMBIN 20000 UNITS EX SOLR
CUTANEOUS | Status: AC
  Filled 2012-03-14: qty 20000

## 2012-03-14 MED ORDER — LABETALOL HCL 5 MG/ML IV SOLN
10.0000 mg | INTRAVENOUS | Status: DC | PRN
  Filled 2012-03-14: qty 4

## 2012-03-14 MED ORDER — EPHEDRINE SULFATE 50 MG/ML IJ SOLN
INTRAMUSCULAR | Status: DC | PRN
  Administered 2012-03-14: 25 mg via INTRAVENOUS
  Administered 2012-03-14 (×2): 10 mg via INTRAVENOUS

## 2012-03-14 MED ORDER — SODIUM CHLORIDE 0.9 % IV SOLN
INTRAVENOUS | Status: DC | PRN
  Administered 2012-03-14 (×3): via INTRAVENOUS

## 2012-03-14 MED ORDER — HEPARIN SODIUM (PORCINE) 1000 UNIT/ML DIALYSIS
1000.0000 [IU] | INTRAMUSCULAR | Status: DC | PRN
  Filled 2012-03-14: qty 1

## 2012-03-14 MED ORDER — LIDOCAINE-EPINEPHRINE (PF) 1 %-1:200000 IJ SOLN
INTRAMUSCULAR | Status: AC
  Filled 2012-03-14: qty 10

## 2012-03-14 MED ORDER — METOPROLOL SUCCINATE ER 100 MG PO TB24
100.0000 mg | ORAL_TABLET | ORAL | Status: DC
  Filled 2012-03-14 (×2): qty 1

## 2012-03-14 MED ORDER — ONDANSETRON HCL 4 MG/2ML IJ SOLN
INTRAMUSCULAR | Status: DC | PRN
  Administered 2012-03-14: 4 mg via INTRAVENOUS

## 2012-03-14 MED ORDER — ONDANSETRON HCL 4 MG/2ML IJ SOLN
4.0000 mg | Freq: Four times a day (QID) | INTRAMUSCULAR | Status: DC | PRN
  Filled 2012-03-14 (×3): qty 2

## 2012-03-14 MED ORDER — DOCUSATE SODIUM 283 MG RE ENEM: 1.0000 | ENEMA | RECTAL | Status: DC | PRN

## 2012-03-14 MED ORDER — METOCLOPRAMIDE HCL 5 MG/ML IJ SOLN: 10.0000 mg | Freq: Once | INTRAMUSCULAR | Status: DC | PRN

## 2012-03-14 MED ORDER — HYDROMORPHONE HCL PF 1 MG/ML IJ SOLN
INTRAMUSCULAR | Status: AC
  Administered 2012-03-14: 1 mg via INTRAVENOUS
  Filled 2012-03-14: qty 1

## 2012-03-14 MED ORDER — PANTOPRAZOLE SODIUM 40 MG PO TBEC
40.0000 mg | DELAYED_RELEASE_TABLET | Freq: Every day | ORAL | Status: DC
  Administered 2012-03-14 – 2012-03-15 (×2): 40 mg via ORAL
  Filled 2012-03-14 (×3): qty 1

## 2012-03-14 MED ORDER — CAMPHOR-MENTHOL 0.5-0.5 % EX LOTN
1.0000 "application " | TOPICAL_LOTION | Freq: Three times a day (TID) | CUTANEOUS | Status: DC | PRN
  Filled 2012-03-14: qty 222

## 2012-03-14 MED ORDER — SENNA 8.6 MG PO TABS
1.0000 | ORAL_TABLET | Freq: Two times a day (BID) | ORAL | Status: DC
  Administered 2012-03-14: 8.6 mg via ORAL
  Filled 2012-03-14 (×4): qty 1

## 2012-03-14 MED ORDER — HYDROXYZINE HCL 25 MG PO TABS
25.0000 mg | ORAL_TABLET | Freq: Three times a day (TID) | ORAL | Status: DC | PRN
  Administered 2012-03-15: 25 mg via ORAL
  Filled 2012-03-14: qty 1

## 2012-03-14 MED ORDER — PHENYLEPHRINE HCL 10 MG/ML IJ SOLN
INTRAMUSCULAR | Status: DC | PRN
  Administered 2012-03-14: 80 ug via INTRAVENOUS
  Administered 2012-03-14: 160 ug via INTRAVENOUS
  Administered 2012-03-14: 120 ug via INTRAVENOUS
  Administered 2012-03-14: 40 ug via INTRAVENOUS
  Administered 2012-03-14: 120 ug via INTRAVENOUS
  Administered 2012-03-14 (×3): 80 ug via INTRAVENOUS
  Administered 2012-03-14: 120 ug via INTRAVENOUS

## 2012-03-14 MED ORDER — ALTEPLASE 2 MG IJ SOLR
2.0000 mg | Freq: Once | INTRAMUSCULAR | Status: AC | PRN
  Filled 2012-03-14: qty 2

## 2012-03-14 MED ORDER — MIDAZOLAM HCL 5 MG/5ML IJ SOLN
INTRAMUSCULAR | Status: DC | PRN
  Administered 2012-03-14: 2 mg via INTRAVENOUS

## 2012-03-14 MED ORDER — GLUCOSE-VITAMIN C 4-6 GM-MG PO CHEW
4.0000 | CHEWABLE_TABLET | ORAL | Status: DC | PRN
  Administered 2012-03-14: 4 via ORAL

## 2012-03-14 MED ORDER — PENTAFLUOROPROP-TETRAFLUOROETH EX AERO
1.0000 | INHALATION_SPRAY | CUTANEOUS | Status: DC | PRN
Start: 2012-03-14 — End: 2012-03-15

## 2012-03-14 MED ORDER — INSULIN GLARGINE 100 UNIT/ML ~~LOC~~ SOLN
7.0000 [IU] | Freq: Two times a day (BID) | SUBCUTANEOUS | Status: DC
  Administered 2012-03-14 – 2012-03-15 (×2): 7 [IU] via SUBCUTANEOUS

## 2012-03-14 MED ORDER — HYDROMORPHONE HCL PF 1 MG/ML IJ SOLN
0.5000 mg | INTRAMUSCULAR | Status: DC | PRN
  Administered 2012-03-14 – 2012-03-15 (×4): 1 mg via INTRAVENOUS
  Filled 2012-03-14 (×2): qty 1

## 2012-03-14 MED ORDER — ZOLPIDEM TARTRATE 5 MG PO TABS
10.0000 mg | ORAL_TABLET | Freq: Every evening | ORAL | Status: DC | PRN
  Administered 2012-03-15: 10 mg via ORAL
  Filled 2012-03-14: qty 2

## 2012-03-14 MED ORDER — DEXTROSE 50 % IV SOLN
INTRAVENOUS | Status: DC | PRN
  Administered 2012-03-14: 12.5 g via INTRAVENOUS

## 2012-03-14 MED ORDER — DEXTROSE 50 % IV SOLN
INTRAVENOUS | Status: AC
  Administered 2012-03-14: 50 mL via INTRAVENOUS
  Filled 2012-03-14: qty 50

## 2012-03-14 MED ORDER — SODIUM CHLORIDE 0.9 % IJ SOLN
10.0000 mL | INTRAMUSCULAR | Status: DC | PRN
  Administered 2012-03-15 (×2): 10 mL

## 2012-03-14 MED ORDER — LIDOCAINE-PRILOCAINE 2.5-2.5 % EX CREA: 1.0000 "application " | TOPICAL_CREAM | CUTANEOUS | Status: DC | PRN

## 2012-03-14 MED ORDER — PROTAMINE SULFATE 10 MG/ML IV SOLN
INTRAVENOUS | Status: DC | PRN
  Administered 2012-03-14: 20 mg via INTRAVENOUS
  Administered 2012-03-14: 30 mg via INTRAVENOUS

## 2012-03-14 MED ORDER — PHENOL 1.4 % MT LIQD: 1.0000 | OROMUCOSAL | Status: DC | PRN

## 2012-03-14 MED ORDER — ACETAMINOPHEN 325 MG PO TABS: 325.0000 mg | ORAL_TABLET | ORAL | Status: DC | PRN

## 2012-03-14 MED ORDER — ZOLPIDEM TARTRATE 5 MG PO TABS: 5.0000 mg | ORAL_TABLET | Freq: Every evening | ORAL | Status: DC | PRN

## 2012-03-14 MED ORDER — LIDOCAINE HCL (CARDIAC) 20 MG/ML IV SOLN
INTRAVENOUS | Status: DC | PRN
  Administered 2012-03-14: 50 mg via INTRAVENOUS

## 2012-03-14 MED ORDER — ACETAMINOPHEN 325 MG PO TABS: 650.0000 mg | ORAL_TABLET | Freq: Four times a day (QID) | ORAL | Status: DC | PRN

## 2012-03-14 MED ORDER — CALCIUM CARBONATE 1250 MG/5ML PO SUSP: 500.0000 mg | Freq: Four times a day (QID) | ORAL | Status: DC | PRN

## 2012-03-14 MED ORDER — ALBUTEROL SULFATE HFA 108 (90 BASE) MCG/ACT IN AERS
INHALATION_SPRAY | RESPIRATORY_TRACT | Status: DC | PRN
  Administered 2012-03-14: 2 via RESPIRATORY_TRACT

## 2012-03-14 MED ORDER — DEXTROSE 5 % IV SOLN
750.0000 mg | Freq: Once | INTRAVENOUS | Status: DC
  Filled 2012-03-14: qty 750

## 2012-03-14 MED ORDER — LIDOCAINE HCL (PF) 1 % IJ SOLN: 5.0000 mL | INTRAMUSCULAR | Status: DC | PRN

## 2012-03-14 MED ORDER — SORBITOL 70 % SOLN: 30.0000 mL | Status: DC | PRN

## 2012-03-14 MED ORDER — OXYCODONE HCL 5 MG PO TABS
5.0000 mg | ORAL_TABLET | ORAL | Status: DC | PRN
  Administered 2012-03-15: 10 mg via ORAL
  Filled 2012-03-14: qty 2

## 2012-03-14 MED ORDER — CALCIUM ACETATE 667 MG PO CAPS
1334.0000 mg | ORAL_CAPSULE | Freq: Three times a day (TID) | ORAL | Status: DC
  Administered 2012-03-15: 1334 mg via ORAL
  Filled 2012-03-14 (×5): qty 3

## 2012-03-14 MED ORDER — SODIUM CHLORIDE 0.9 % IR SOLN
Status: DC | PRN
  Administered 2012-03-14: 09:00:00

## 2012-03-14 MED ORDER — OXYCODONE HCL 5 MG PO TABS: 5.0000 mg | ORAL_TABLET | Freq: Four times a day (QID) | ORAL | Status: DC | PRN

## 2012-03-14 MED ORDER — GLUCOSE-VITAMIN C 4-6 GM-MG PO CHEW
CHEWABLE_TABLET | ORAL | Status: AC
  Filled 2012-03-14: qty 1

## 2012-03-14 MED ORDER — INSULIN ASPART 100 UNIT/ML ~~LOC~~ SOLN: 3.0000 [IU] | Freq: Three times a day (TID) | SUBCUTANEOUS | Status: DC

## 2012-03-14 MED ORDER — INSULIN ASPART 100 UNIT/ML ~~LOC~~ SOLN
0.0000 [IU] | Freq: Three times a day (TID) | SUBCUTANEOUS | Status: DC
Start: 2012-03-14 — End: 2012-03-15

## 2012-03-14 MED ORDER — ONDANSETRON HCL 4 MG PO TABS: 4.0000 mg | ORAL_TABLET | Freq: Four times a day (QID) | ORAL | Status: DC | PRN

## 2012-03-14 MED ORDER — PROPOFOL 10 MG/ML IV BOLUS
INTRAVENOUS | Status: DC | PRN
  Administered 2012-03-14: 150 mg via INTRAVENOUS

## 2012-03-14 MED ORDER — CLONAZEPAM 1 MG PO TABS
2.0000 mg | ORAL_TABLET | Freq: Every day | ORAL | Status: DC
  Administered 2012-03-14: 2 mg via ORAL
  Filled 2012-03-14: qty 2

## 2012-03-14 SURGICAL SUPPLY — 40 items
CANISTER SUCTION 2500CC (MISCELLANEOUS) ×3 IMPLANT
CLIP TI MEDIUM 6 (CLIP) ×3 IMPLANT
CLIP TI WIDE RED SMALL 6 (CLIP) ×3 IMPLANT
CLOTH BEACON ORANGE TIMEOUT ST (SAFETY) ×3 IMPLANT
COVER SURGICAL LIGHT HANDLE (MISCELLANEOUS) ×3 IMPLANT
DERMABOND ADVANCED (GAUZE/BANDAGES/DRESSINGS) ×2
DERMABOND ADVANCED .7 DNX12 (GAUZE/BANDAGES/DRESSINGS) ×4 IMPLANT
DRAPE INCISE IOBAN 66X45 STRL (DRAPES) ×3 IMPLANT
ELECT REM PT RETURN 9FT ADLT (ELECTROSURGICAL) ×3
ELECTRODE REM PT RTRN 9FT ADLT (ELECTROSURGICAL) ×2 IMPLANT
GEL ULTRASOUND 20GR AQUASONIC (MISCELLANEOUS) IMPLANT
GLOVE BIO SURGEON STRL SZ7.5 (GLOVE) ×6 IMPLANT
GLOVE BIOGEL PI IND STRL 7.5 (GLOVE) ×2 IMPLANT
GLOVE BIOGEL PI IND STRL 8 (GLOVE) ×2 IMPLANT
GLOVE BIOGEL PI INDICATOR 7.5 (GLOVE) ×1
GLOVE BIOGEL PI INDICATOR 8 (GLOVE) ×1
GLOVE SS N UNI LF 6.5 STRL (GLOVE) ×3 IMPLANT
GLOVE SURG SS PI 7.5 STRL IVOR (GLOVE) ×3 IMPLANT
GOWN STRL NON-REIN LRG LVL3 (GOWN DISPOSABLE) ×9 IMPLANT
GRAFT GORETEX STRT 4-7X45 (Vascular Products) ×3 IMPLANT
KIT BASIN OR (CUSTOM PROCEDURE TRAY) ×3 IMPLANT
KIT ROOM TURNOVER OR (KITS) ×3 IMPLANT
NS IRRIG 1000ML POUR BTL (IV SOLUTION) ×3 IMPLANT
PACK CV ACCESS (CUSTOM PROCEDURE TRAY) ×3 IMPLANT
PAD ARMBOARD 7.5X6 YLW CONV (MISCELLANEOUS) ×6 IMPLANT
SPONGE GAUZE 4X4 12PLY (GAUZE/BANDAGES/DRESSINGS) ×3 IMPLANT
SPONGE SURGIFOAM ABS GEL 100 (HEMOSTASIS) IMPLANT
SUT ETHILON 3 0 PS 1 (SUTURE) IMPLANT
SUT PROLENE 6 0 BV (SUTURE) ×12 IMPLANT
SUT SILK 0 TIES 10X30 (SUTURE) ×3 IMPLANT
SUT VIC AB 2-0 CTB1 (SUTURE) ×3 IMPLANT
SUT VIC AB 3-0 SH 27 (SUTURE) ×4
SUT VIC AB 3-0 SH 27X BRD (SUTURE) ×8 IMPLANT
SUT VICRYL 4-0 PS2 18IN ABS (SUTURE) ×9 IMPLANT
SWAB COLLECTION DEVICE MRSA (MISCELLANEOUS) IMPLANT
TOWEL OR 17X24 6PK STRL BLUE (TOWEL DISPOSABLE) ×3 IMPLANT
TOWEL OR 17X26 10 PK STRL BLUE (TOWEL DISPOSABLE) ×3 IMPLANT
TUBE ANAEROBIC SPECIMEN COL (MISCELLANEOUS) IMPLANT
UNDERPAD 30X30 INCONTINENT (UNDERPADS AND DIAPERS) ×3 IMPLANT
WATER STERILE IRR 1000ML POUR (IV SOLUTION) ×3 IMPLANT

## 2012-03-14 NOTE — H&P (Signed)
[facility] [place]  Patient name: [name] MRN: [id number] DOB: 11/11/1970 Sex: female  CC: chronic kidney disease with need for new hemodialysis access  HPI: [name] is a 41 y.o. female has exhausted her upper extremity access options. 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 She is brought in electively for a thigh graft.  Past Medical History  Diagnosis Date  . Hyperlipidemia   . Alopecia   . Obesity   . Iron deficiency   . 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 RLS (restless legs syndrome)   . Respiratory failure   . Injury of conjunctiva and corneal abrasion of right eye without foreign body   . Cellulitis   . Dialysis patient     Tues; Thurs; Sat; [place]  . Angina   . 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 Anemia   . Pneumonia 2012    "double"  . Blood transfusion 2004    "when I had my transplant"  . Migraine   . 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 Hyperparathyroidism, secondary   . Diabetic retinopathy(362.0)   . Hypertension     takes Metoprolol and Exforge daily  . Depression     takes Klonopin nightly  . Diabetes mellitus type 1     Pt states diagnosed at age 84 with prior episodes of DKA. S/p pancreatic transplant   . GERD (gastroesophageal reflux disease)     takes Protonix daily  . PONV (postoperative nausea and vomiting)   . Bronchitis   . Migraine   . Asthma   . Shortness of breath     History reviewed. No pertinent family history.  SOCIAL HISTORY: History  Substance Use Topics  . Smoking status: Never Smoker   . Smokeless tobacco: Never Used  . Alcohol Use: No    Allergies  Allergen Reactions  . Morphine And Related Nausea And Vomiting and Other (See Comments)      "makes me delerirous"  . Tramadol Nausea And Vomiting  . Vicodin (Hydrocodone-Acetaminophen) Rash    Current Facility-Administered Medications  Medication Dose Route Frequency Provider Last Rate Last Dose  . cefUROXime (ZINACEF) 1.5 g in dextrose 5 % 50 mL IVPB  1.5 g Intravenous 30 min Pre-Op Chuck Hint, MD   1.5 g at 03/14/12 0740  . dextrose 50 % solution 25 mL  25 mL Intravenous Once PRN Hart Robinsons, MD      . dextrose 50 % solution 50 mL  1 ampule Intravenous To OR Chuck Hint, MD      . fentaNYL (SUBLIMAZE) 0.05 MG/ML injection           . fentaNYL (SUBLIMAZE) injection 25-50 mcg  25-50 mcg Intravenous Q5 min PRN Hart Robinsons, MD   50 mcg at 03/14/12 1333  . glucose-Vitamin C 4-0.006 GM per chewable tablet 4 tablet  4 tablet Oral PRN Hart Robinsons, MD   4 tablet at 03/14/12 1233  . glucose-Vitamin C 4-0.006 GM per chewable tablet           . insulin aspart (novoLOG) injection 5 Units  5 Units Subcutaneous Once Hart Robinsons, MD   5 Units at 03/14/12 0706  . metoCLOPramide (REGLAN) injection 10  mg  10 mg Intravenous Once PRN Hart Robinsons, MD      . DISCONTD: 0.9 % irrigation (POUR BTL)    PRN Chuck Hint, MD   1,000 mL at 03/14/12 0834  . DISCONTD: heparin 6,000 Units in sodium chloride irrigation 0.9 % 500 mL irrigation    PRN Chuck Hint, MD       Facility-Administered Medications Ordered in Other Encounters  Medication Dose Route Frequency Provider Last Rate Last Dose  . DISCONTD: 0.9 %  sodium chloride infusion    Continuous PRN Carmela Rima, CRNA      . DISCONTD: albuterol (PROVENTIL HFA;VENTOLIN HFA) 108 (90 BASE) MCG/ACT inhaler    PRN Carmela Rima, CRNA   2 puff at 03/14/12 0739  . DISCONTD: dextrose 50 % solution    PRN Carmela Rima, CRNA   12.5 g at 03/14/12 0940  . DISCONTD: ePHEDrine injection    PRN Carmela Rima, CRNA   25 mg at 03/14/12 0917  . DISCONTD: fentaNYL (SUBLIMAZE) injection     PRN Carmela Rima, CRNA   25 mcg at 03/14/12 [phone]  . DISCONTD: heparin injection    PRN Carmela Rima, CRNA   6,000 Units at 03/14/12 [phone]  . DISCONTD: lidocaine (cardiac) 100 mg/26ml (XYLOCAINE) 20 MG/ML injection 2%    PRN Carmela Rima, CRNA   50 mg at 03/14/12 0747  . DISCONTD: midazolam (VERSED) 5 MG/5ML injection    PRN Carmela Rima, CRNA   2 mg at 03/14/12 0744  . DISCONTD: ondansetron (ZOFRAN) injection    PRN Carmela Rima, CRNA   4 mg at 03/14/12 0939  . DISCONTD: phenylephrine (NEO-SYNEPHRINE) injection    PRN Carmela Rima, CRNA   160 mcg at 03/14/12 1000  . DISCONTD: propofol (DIPRIVAN) 10 mg/mL bolus/IV push    PRN Carmela Rima, CRNA   150 mg at 03/14/12 0747  . DISCONTD: protamine injection    PRN Carmela Rima, CRNA   20 mg at 03/14/12 5409    REVIEW OF SYSTEMS: Arly.Keller ] denotes positive finding; [  ] denotes negative finding CARDIOVASCULAR:  [ ]  chest pain   [ ]  chest pressure   [ ]  palpitations   [ ]  orthopnea   [ ]  dyspnea on exertion   [ ]  claudication   [ ]  rest pain   [ ]  DVT   [ ]  phlebitis PULMONARY:   [ ]  productive cough   [ ]  asthma   [ ]  wheezing NEUROLOGIC:   [ ]  weakness  [ ]  paresthesias  [ ]  aphasia  [ ]  amaurosis  [ ]  dizziness HEMATOLOGIC:   [ ]  bleeding problems   [ ]  clotting disorders MUSCULOSKELETAL:  [ ]  joint pain   [ ]  joint swelling [ ]  leg swelling GASTROINTESTINAL: [ ]   blood in stool  [ ]   hematemesis GENITOURINARY:  [ ]   dysuria  [ ]   hematuria PSYCHIATRIC:  [ ]  history of major depression INTEGUMENTARY:  [ ]  rashes  [ ]  ulcers CONSTITUTIONAL:  [ ]  fever   [ ]  chills  PHYSICAL EXAM: Filed Vitals:   03/14/12 1328 03/14/12 1330 03/14/12 1345 03/14/12 1400  BP: 108/65 117/83 107/66 139/68  Pulse: 74 75 72 72  Temp:    97.4 F (36.3 C)  TempSrc:      Resp: 17 16 9 25   Height:      Weight:      SpO2: 100% 100%  100% 100%   Body mass index is 27.40 kg/(m^2). GENERAL: The patient is a well-nourished female, in  no acute distress. The vital signs are documented above. CARDIOVASCULAR: There is a regular rate and rhythm without significant murmur appreciated.  PULMONARY: There is good air exchange bilaterally without wheezing or rales. ABDOMEN: Soft and non-tender with normal pitched bowel sounds.  MUSCULOSKELETAL: There are no major deformities or cyanosis. NEUROLOGIC: No focal weakness or paresthesias are detected. SKIN: There are no ulcers or rashes noted. PSYCHIATRIC: The patient has a normal affect.  DATA:  Her potassium is 4.7.  MEDICAL ISSUES: This patient presents for elective placement of a right thigh AV graft and ligation of her left upper arm graft.  Solveig Fangman S [facility] [place] Office: [phone]

## 2012-03-14 NOTE — Consult Note (Signed)
[facility] [place]  Patient name: [name] MRN: [id number] DOB: [birth year] Sex: female  CC: left arm swelling  HPI: [name] is a 41 y.o. female who is now exhausted her upper extremity hemodialysis access options. Her current graft is a left arm graft and she developed significant arm swelling after this. She underwent a venogram which showed an occlusion of the central veins on the left and therefore she is brought in for ligation of her graft and placement of a thigh graft. She has had no recent uremic symptoms. Specifically she denies nausea, vomiting, palpitations, fatigue, or anorexia.  Past Medical History  Diagnosis Date  . Hyperlipidemia   . Alopecia   . Obesity   . Iron deficiency   . 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 RLS (restless legs syndrome)   . Respiratory failure   . Injury of conjunctiva and corneal abrasion of right eye without foreign body   . Cellulitis   . Dialysis patient     Tues; Thurs; Sat; [place]  . Angina   . 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 Anemia   . Pneumonia 2012    "double"  . Blood transfusion 2004    "when I had my transplant"  . Migraine   . 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 Hyperparathyroidism, secondary   . Diabetic retinopathy(362.0)   . Hypertension     takes Metoprolol and Exforge daily  . Depression     takes Klonopin nightly  . Diabetes mellitus type 1     Pt states diagnosed at age 28 with prior episodes of DKA. S/p pancreatic transplant   . GERD (gastroesophageal reflux disease)     takes Protonix daily  . PONV (postoperative nausea and vomiting)   . Bronchitis   . Migraine   . Asthma   . Shortness of breath     History reviewed. No pertinent family history.  SOCIAL HISTORY: History  Substance Use Topics  . Smoking status: Never  Smoker   . Smokeless tobacco: Never Used  . Alcohol Use: No    Allergies  Allergen Reactions  . Morphine And Related Nausea And Vomiting and Other (See Comments)    "makes me delerirous"  . Tramadol Nausea And Vomiting  . Vicodin (Hydrocodone-Acetaminophen) Rash    Current Facility-Administered Medications  Medication Dose Route Frequency Provider Last Rate Last Dose  . cefUROXime (ZINACEF) 1.5 g in dextrose 5 % 50 mL IVPB  1.5 g Intravenous 30 min Pre-Op Chuck Hint, MD        REVIEW OF SYSTEMS: Arly.Keller ] denotes positive finding; [  ] denotes negative finding CARDIOVASCULAR:  [ ]  chest pain   [ ]  chest pressure   [ ]  palpitations   [ ]  orthopnea   [ ]  dyspnea on exertion   [ ]  claudication   [ ]  rest pain   [ ]  DVT   [ ]  phlebitis PULMONARY:   [ ]  productive cough   [ ]  asthma   [ ]  wheezing NEUROLOGIC:   [ ]  weakness  [ ]  paresthesias  [ ]  aphasia  [ ]  amaurosis  [ ]  dizziness HEMATOLOGIC:   [ ]  bleeding problems   [ ]  clotting disorders MUSCULOSKELETAL:  [ ]  joint pain   [ ]  joint swelling [ ]  leg  swelling GASTROINTESTINAL: [ ]   blood in stool  [ ]   hematemesis GENITOURINARY:  [ ]   dysuria  [ ]   hematuria PSYCHIATRIC:  [ ]  history of major depression INTEGUMENTARY:  [ ]  rashes  [ ]  ulcers CONSTITUTIONAL:  [ ]  fever   [ ]  chills  PHYSICAL EXAM: Filed Vitals:   03/12/12 1738 03/14/12 0635  BP:  142/83  Pulse:  78  Temp:  97.8 F (36.6 C)  TempSrc:  Oral  Resp:  18  Height: 5\' 1"  (1.549 m)   Weight: 145 lb (65.772 kg)   SpO2:  97%   Body mass index is 27.40 kg/(m^2). GENERAL: The patient is a well-nourished female, in no acute distress. The vital signs are documented above. CARDIOVASCULAR: There is a regular rate and rhythm without significant murmur appreciated. She has significant left upper arm swelling. PULMONARY: There is good air exchange bilaterally without wheezing or rales. ABDOMEN: Soft and non-tender with normal pitched bowel sounds.  MUSCULOSKELETAL:  There are no major deformities or cyanosis. NEUROLOGIC: No focal weakness or paresthesias are detected. SKIN: There are no ulcers or rashes noted. PSYCHIATRIC: The patient has a normal affect.  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 Graft is patent.  MEDICAL ISSUES: This patient presents for ligation of her left arm graft and placement of a thigh graft. We will plan to keep her overnight for observation. She will go home after dialysis tomorrow. We will notify the nephrologist of her admission.  Arnel Wymer S [facility] [place] Office: [phone]

## 2012-03-14 NOTE — Anesthesia Preprocedure Evaluation (Addendum)
Anesthesia Evaluation  Patient identified by MRN, date of birth, ID band Patient awake    Reviewed: Allergy & Precautions, H&P , NPO status , Patient's Chart, lab work & pertinent test results, reviewed documented beta blocker date and time   History of Anesthesia Complications (+) PONV  Airway Mallampati: II TM Distance: >3 FB Neck ROM: full    Dental  (+) Teeth Intact and Dental Advidsory Given   Pulmonary shortness of breath and with exertion, asthma , pneumonia -, resolved, COPD COPD inhaler,  breath sounds clear to auscultation        Cardiovascular hypertension, On Home Beta Blockers + angina with exertion Rhythm:regular     Neuro/Psych  Headaches, PSYCHIATRIC DISORDERS    GI/Hepatic Neg liver ROS, GERD-  Medicated and Controlled,  Endo/Other  diabetes, Type 1, Insulin Dependent  Renal/GU ESRFRenal disease  negative genitourinary   Musculoskeletal   Abdominal   Peds  Hematology negative hematology ROS (+)   Anesthesia Other Findings See surgeon's H&P   Reproductive/Obstetrics negative OB ROS                          Anesthesia Physical Anesthesia Plan  ASA: III  Anesthesia Plan: General   Post-op Pain Management:    Induction: Intravenous  Airway Management Planned: LMA  Additional Equipment:   Intra-op Plan:   Post-operative Plan: Extubation in OR  Informed Consent: I have reviewed the patients History and Physical, chart, labs and discussed the procedure including the risks, benefits and alternatives for the proposed anesthesia with the patient or authorized representative who has indicated his/her understanding and acceptance.   Dental Advisory Given  Plan Discussed with: Anesthesiologist, CRNA and Surgeon  Anesthesia Plan Comments:        Anesthesia Quick Evaluation

## 2012-03-14 NOTE — Telephone Encounter (Signed)
Message copied by Fredrich Birks on Fri Mar 14, 2012 11:42 AM ------      Message from: Melene Plan      Created: Fri Mar 14, 2012 10:45 AM      Regarding: FW: charge and f/u                   ----- Message -----         From: Chuck Hint, MD         Sent: 03/14/2012  10:13 AM           To: Reuel Derby, Melene Plan, RN      Subject: charge and f/u                                           PROCEDURE:       1. Placement of new right thigh AV graft      2. Ligation of left upper arm AV graft            SURGEON: Di Kindle. Edilia Bo, MD, FACS            ASSIST: Doreatha Massed, PA            She needs a follow up visit in 2 weeks. Thank you. CSD

## 2012-03-14 NOTE — Op Note (Signed)
NAME: [name]   MRN: [id number] DOB: [birth year]    DATE OF OPERATION: 03/14/2012  PREOP DIAGNOSIS: Chronic kidney disease  POSTOP DIAGNOSIS: Same  PROCEDURE:  1. Placement of new right thigh AV graft 2. Ligation of left upper arm AV graft  SURGEON: Di Kindle. Edilia Bo, MD, FACS  ASSIST: Doreatha Massed, PA  ANESTHESIA: Gen.   EBL: minimal  INDICATIONS: [name] is a 41 y.o. female has exhausted her upper extremity access options. 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 She is brought in for a right thigh AV graft.  FINDINGS: because of her body habitus, I elected to make a groin incision below the inguinal crease. The arterial anastomosis was to the superficial femoral artery. The venous anastomosis was to the greater saphenous vein.  TECHNIQUE: The patient was taken to the operating room and received a general anesthetic. The left arm and right groin and thigh were prepped and draped in the usual sterile fashion. An oblique incision was made below the inguinal crease on the right. The dissection was carried down to the saphenous vein which was controlled with a vessel loop. Next the superficial femoral artery was dissected free and controlled into branches were controlled with vessel loops. Using a distal counterincision a 4-7 mm graft was then tunneled in a loop fashion in the thigh. The patient was then heparinized. The superficial femoral artery was clamped proximally and distally a longitudinal arteriotomy was made. A segment of the 4 mm end of the graft was excised the graft spatulated and sewn end to side to the superficial femoral artery using continuous 6-0 Prolene suture. Graft and to the appropriate length anastomosis of the saphenous vein. Saphenous vein was ligated distally and spatulated proximally. Graft was cut to appropriate length spatulated and sewn end-to-end to the  vein using continuous 6-0 Prolene suture. The patient was a good thrill in the graft. Hemostasis was obtained the heparin was partially reversed with protamine. Groin incision was closed deeply at Vicryl. The subcutaneous layer was closed with 2-0 Vicryl. The skin was closed with a subcuticular stitch. The distal counterincision was closed with deep layer 3-0 Vicryl. The skin was closed with a subcuticular stitch. Dermabond was applied.  Next attention was turned to the left arm. An incision was made just above the antecubital level. Graft here was dissected free and ligated with an 0 silk tie. This wound was closed with a deep later 3-0 Vicryl and the skin closed with 4-0 Vicryl. Dermabond was applied. The patient tolerated the procedure well and was transferred to the recovery room in stable condition.   Waverly Ferrari, MD, FACS [facility]  DATE OF DICTATION:   03/14/2012

## 2012-03-14 NOTE — Progress Notes (Signed)
Hypoglycemic Event  CBG: 50 Treatment: D50 IV 50 mL  Symptoms: None  Follow-up CBG: Time:1701 CBG Result:167  Possible Reasons for Event: Inadequate meal intake and Other:  Patient received insulin prior to OR  Comments/MD notified:Fields, on call for Dr. Edilia Bo  Multiple attempts to bring CBG up per results in EPIC.  Between each CBG, patient was eating snacks and drinking sugary drinks with no result.  Patient states dextrose makes her nauseated.  Patient was pre-medicated with IV zofran.  Patient was then given 50mg  Dextrose IV.  Blood sugar then returned to 167.    Collins, Teleah Villamar [place]  Remember to initiate Hypoglycemia Order Set & complete

## 2012-03-14 NOTE — Anesthesia Procedure Notes (Signed)
Procedure Name: LMA Insertion Date/Time: 03/14/2012 7:48 AM Performed by: Carmela Rima Pre-anesthesia Checklist: Emergency Drugs available, Patient identified, Timeout performed, Suction available and Patient being monitored Patient Re-evaluated:Patient Re-evaluated prior to inductionOxygen Delivery Method: Circle system utilized Preoxygenation: Pre-oxygenation with 100% oxygen Intubation Type: IV induction Ventilation: Mask ventilation without difficulty LMA: LMA inserted LMA Size: 4.0 Number of attempts: 1 Placement Confirmation: positive ETCO2 and breath sounds checked- equal and bilateral Tube secured with: Tape Dental Injury: Teeth and Oropharynx as per pre-operative assessment

## 2012-03-14 NOTE — OR Nursing (Signed)
OR NURSING NOTE: Needle count incorrect. Radiologist :Dr. Cipriano Mile ; Xray result : 1 linear object, cannot verify that it is a hemoclip. Dr. Edilia Bo then reviewed xray and determined it is a hemoclip.

## 2012-03-14 NOTE — Telephone Encounter (Signed)
lvm for pt regarding follow up

## 2012-03-14 NOTE — Anesthesia Postprocedure Evaluation (Signed)
Anesthesia Post Note  Patient: [name]  Procedure(s) Performed: Procedure(s) (LRB): INSERTION OF ARTERIOVENOUS (AV) GORE-TEX GRAFT THIGH (Right) LIGATION ARTERIOVENOUS GORTEX GRAFT (Left)  Anesthesia type: general  Patient location: PACU  Post pain: Pain level controlled  Post assessment: Patient's Cardiovascular Status Stable  Last Vitals:  Filed Vitals:   03/14/12 1443  BP: 122/68  Pulse: 76  Temp: 36.3 C  Resp: 14    Post vital signs: Reviewed and stable  Level of consciousness: sedated  Complications: No apparent anesthesia complications

## 2012-03-14 NOTE — Progress Notes (Signed)
Pharmacy: Renal Antibiotic Adjustment  OBJECTIVE:  ESRD Wt: 65.8 kg  ASSESSMENT:  41 y.o. F with ESRD to receive Zinacef post-op for 24 hour coverage. The patient received a dose of Zinacef around 0800 this morning and is scheduled to go for HD tomorrow. Only about ~25% on Zinacef is removed through HD  PLAN:  1. Adjust post-op Zinacef to 750 mg x 1 dose post HD tomorrow (in case pt goes for early a.m treatment -- will continue to have coverage x 24 hours) 2. Will sign off as no additional dose adjustments expected.   Georgina Pillion, PharmD, BCPS Clinical Pharmacist Pager: [phone] 03/14/2012 3:21 PM

## 2012-03-14 NOTE — Preoperative (Signed)
Beta Blockers   Reason not to administer Beta Blockers:Not Applicable 

## 2012-03-14 NOTE — Progress Notes (Signed)
Call to Dr. Gelene Mink, reported CBG of 387 & also remarked that her peripheral access is limited & there is a ? Plan for accessing dialysis cath. For lab [facility]) & IV access in [facility].  Dr. Gelene Mink agrees with accessing dialysis cath. for lab & IV.  Order furnished for Insulin to be given now.

## 2012-03-14 NOTE — Transfer of Care (Signed)
Immediate Anesthesia Transfer of Care Note  Patient: [name]  Procedure(s) Performed: Procedure(s) (LRB) with comments: INSERTION OF ARTERIOVENOUS (AV) GORE-TEX GRAFT THIGH (Right) LIGATION ARTERIOVENOUS GORTEX GRAFT (Left)  Patient Location: PACU  Anesthesia Type: General  Level of Consciousness: sedated  Airway & Oxygen Therapy: Patient Spontanous Breathing and Patient connected to nasal cannula oxygen  Post-op Assessment: Report given to PACU RN and Post -op Vital signs reviewed and stable  Post vital signs: Reviewed and stable  Complications: No apparent anesthesia complications

## 2012-03-15 ENCOUNTER — Ambulatory Visit (HOSPITAL_COMMUNITY): Payer: Medicare Other

## 2012-03-15 LAB — CBC
HCT: 35 % — ABNORMAL LOW (ref 36.0–46.0)
Hemoglobin: 11.4 g/dL — ABNORMAL LOW (ref 12.0–15.0)
MCH: 31.8 pg (ref 26.0–34.0)
MCHC: 32.6 g/dL (ref 30.0–36.0)
MCV: 97.8 fL (ref 78.0–100.0)
RBC: 3.58 MIL/uL — ABNORMAL LOW (ref 3.87–5.11)

## 2012-03-15 LAB — BASIC METABOLIC PANEL
BUN: 49 mg/dL — ABNORMAL HIGH (ref 6–23)
CO2: 23 mEq/L (ref 19–32)
Calcium: 7.3 mg/dL — ABNORMAL LOW (ref 8.4–10.5)
Chloride: 89 mEq/L — ABNORMAL LOW (ref 96–112)
Creatinine, Ser: 9.05 mg/dL — ABNORMAL HIGH (ref 0.50–1.10)
GFR calc Af Amer: 6 mL/min — ABNORMAL LOW (ref >90)
GFR calc non Af Amer: 5 mL/min — ABNORMAL LOW (ref >90)
Glucose, Bld: 348 mg/dL — ABNORMAL HIGH (ref 70–99)
Potassium: 4.4 mEq/L (ref 3.5–5.1)
Sodium: 130 mEq/L — ABNORMAL LOW (ref 135–145)

## 2012-03-15 LAB — HEMOGLOBIN A1C: Hgb A1c MFr Bld: 9.8 % — ABNORMAL HIGH (ref <5.7)

## 2012-03-15 LAB — GLUCOSE, CAPILLARY
Glucose-Capillary: 322 mg/dL — ABNORMAL HIGH (ref 70–99)
Glucose-Capillary: 229 mg/dL — ABNORMAL HIGH (ref 70–99)

## 2012-03-15 MED ORDER — PARICALCITOL 5 MCG/ML IV SOLN
INTRAVENOUS | Status: AC
  Administered 2012-03-15: 3 ug via INTRAVENOUS
  Filled 2012-03-15: qty 1

## 2012-03-15 MED ORDER — HYDROMORPHONE HCL PF 1 MG/ML IJ SOLN
INTRAMUSCULAR | Status: AC
  Administered 2012-03-15: 1 mg via INTRAVENOUS
  Filled 2012-03-15: qty 1

## 2012-03-15 NOTE — Discharge Summary (Signed)
[facility] Discharge Summary  [name] 1971-01-29 41 y.o. female  [id number]  Admission Date: 03/14/2012  Discharge Date: 10/191/3  Physician: Chuck Hint, MD  Admission Diagnosis: ESRD;COMPLICATIONS AVG ARM   HPI:   This is a 41 y.o. female has exhausted her upper extremity access options. 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 She is brought in electively for a thigh graft.  Hospital Course:  The patient was admitted to the hospital and taken to the operating room on 03/14/2012 and underwent  1. Placement of new right thigh AV graft  2. Ligation of left upper arm AV graft  The pt tolerated the procedure well and was transported to the PACU in good condition. By POD 1, she is doing well with exception of c/o pain.  She had not been given any pain medication at that time due to low BP on HD.  She is discharged home after HD.  The remainder of the hospital course consisted of increasing mobilization and increasing intake of solids without difficulty.  CBC    Component Value Date/Time   WBC 7.8 03/15/2012 0502   RBC 3.58* 03/15/2012 0502   HGB 11.4* 03/15/2012 0502   HCT 35.0* 03/15/2012 0502   PLT 189 03/15/2012 0502   MCV 97.8 03/15/2012 0502   MCH 31.8 03/15/2012 0502   MCHC 32.6 03/15/2012 0502   RDW 13.9 03/15/2012 0502   LYMPHSABS 1.7 08/28/2011 2030   MONOABS 0.4 08/28/2011 2030   EOSABS 0.2 08/28/2011 2030   BASOSABS 0.0 08/28/2011 2030    BMET    Component Value Date/Time   NA 130* 03/15/2012 0811   K 4.4 03/15/2012 0811   CL 89* 03/15/2012 0811   CO2 23 03/15/2012 0811   GLUCOSE 348* 03/15/2012 0811   BUN 49* 03/15/2012 0811   CREATININE 9.05* 03/15/2012 0811   CALCIUM 7.3* 03/15/2012 0811   CALCIUM 8.4 03/06/2011 0751   GFRNONAA 5* 03/15/2012 0811   GFRAA 6* 03/15/2012 0811     Discharge Instructions:   The  patient is discharged to home with extensive instructions on wound care and progressive ambulation.  They are instructed not to drive or perform any heavy lifting until returning to see the physician in his office.  Discharge Orders    Future Appointments: Provider: Department: Dept Phone: Center:   04/02/2012 8:45 AM Chuck Hint, MD [facility] [phone] [facility]     Future Orders Please Complete By Expires   Resume previous diet      Driving Restrictions      Comments:   No driving For 24 hours and while taking pain medication.   Lifting restrictions      Comments:   No lifting for 6 weeks   Call MD for:  temperature >100.5      Call MD for:  redness, tenderness, or signs of infection (pain, swelling, bleeding, redness, odor or green/yellow discharge around incision site)      Call MD for:  severe or increased pain, loss or decreased feeling  in affected limb(s)      may wash over wound with mild soap and water         Discharge Diagnosis:  ESRD;COMPLICATIONS AVG ARM  Secondary Diagnosis: Patient Active Problem List  Diagnosis  . Cough  . Hypertension  . ESRD (end stage renal disease)  . Chest pain  . DKA,  type 1  . Abdominal pain  . Headache  . DM (diabetes mellitus), type 1 with renal complications  . Hyperlipidemia  . Alopecia  . GERD (gastroesophageal reflux disease)  . Obesity  . Iron deficiency  . RLS (restless legs syndrome)  . Dialysis patient  . Depression  . Immunosuppression  . Anemia  . Migraine  . Hyperparathyroidism, secondary  . Diabetic retinopathy  . Preventative health care  . Pain in limb  . Other complications due to renal dialysis device, implant, and graft  . End stage renal disease   Past Medical History  Diagnosis Date  . Hyperlipidemia   . Alopecia   . Obesity   . Iron deficiency   . 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 RLS (restless legs syndrome)   .  Respiratory failure   . Injury of conjunctiva and corneal abrasion of right eye without foreign body   . Cellulitis   . Dialysis patient     Tues; Thurs; Sat; [place]  . Angina   . 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 Anemia   . Pneumonia 2012    "double"  . Blood transfusion 2004    "when I had my transplant"  . Migraine   . 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 Hyperparathyroidism, secondary   . Diabetic retinopathy(362.0)   . Hypertension     takes Metoprolol and Exforge daily  . Depression     takes Klonopin nightly  . Diabetes mellitus type 1     Pt states diagnosed at age 76 with prior episodes of DKA. S/p pancreatic transplant   . GERD (gastroesophageal reflux disease)     takes Protonix daily  . PONV (postoperative nausea and vomiting)   . Bronchitis   . Migraine   . Asthma   . Shortness of breath       [name]  Home Medication Instructions [id number]   Printed on:03/15/12 1015  Medication Information                    calcium acetate (PHOSLO) 667 MG capsule Take 1,334-2,001 mg by mouth 3 (three) times daily with meals. Dosage depends on meal consumed           predniSONE (DELTASONE) 5 MG tablet Take 1 tablet by mouth daily.           tacrolimus (PROGRAF) 1 MG capsule Take 1 mg by mouth 2 (two) times daily.            mycophenolate (CELLCEPT) 250 MG capsule Take 250 mg by mouth 2 (two) times daily.            albuterol (PROVENTIL HFA;VENTOLIN HFA) 108 (90 BASE) MCG/ACT inhaler Inhale 2 puffs into the lungs every 4 (four) hours as needed. For wheezing.            clonazePAM (KLONOPIN) 0.5 MG tablet Take 2 mg by mouth at bedtime.            amLODipine-valsartan (EXFORGE) 10-160 MG per tablet Take 1 tablet by mouth daily.           metoprolol succinate (TOPROL-XL) 100 MG 24 hr tablet Take 100 mg by mouth 4 (four) times a week. Do not take the night before  dialysis. Dialysis on Tues, Thurs & Sat.  aspirin EC 81 MG tablet Take 81 mg by mouth daily.           insulin glargine (LANTUS) 100 UNIT/ML injection Inject 7 Units into the skin 2 (two) times daily.           insulin aspart (NOVOLOG) 100 UNIT/ML injection Inject 3-8 Units into the skin 3 (three) times daily before meals. Inject 3 units three times daily.   in addition to sliding scale= 5-8 units.           zolpidem (AMBIEN) 10 MG tablet Take 10 mg by mouth at bedtime as needed. For insomnia.           pantoprazole (PROTONIX) 40 MG tablet Take 40 mg by mouth daily.           oxyCODONE (ROXICODONE) 5 MG immediate release tablet Take 1 tablet (5 mg total) by mouth every 6 (six) hours as needed for pain. #30 NR            Disposition: home  Patient's condition: is Good  Follow up: 1. Dr. Edilia Bo in 2 weeks   Doreatha Massed, PA-C [facility] [phone] 03/15/2012  10:15 AM

## 2012-03-15 NOTE — Progress Notes (Signed)
[facility] Progress Note  03/15/2012 10:42 AM POD 1  Subjective:  C/o pain in her right thigh-has not had pain med due to low BP on HD.  She denies any symptoms of steal in her right foot.   Filed Vitals:   03/15/12 0930  BP: 96/45  Pulse: 90  Temp:   Resp: 14    Physical Exam: Incisions:  Right groin incision is c/d/i Extremities:  Area tunneled is tender to palpation.  I do not appreciate a thrill, but there is a good bruit in the graft.  She has a palpable DP pulse on the right.  CBC    Component Value Date/Time   WBC 7.8 03/15/2012 0502   RBC 3.58* 03/15/2012 0502   HGB 11.4* 03/15/2012 0502   HCT 35.0* 03/15/2012 0502   PLT 189 03/15/2012 0502   MCV 97.8 03/15/2012 0502   MCH 31.8 03/15/2012 0502   MCHC 32.6 03/15/2012 0502   RDW 13.9 03/15/2012 0502   LYMPHSABS 1.7 08/28/2011 2030   MONOABS 0.4 08/28/2011 2030   EOSABS 0.2 08/28/2011 2030   BASOSABS 0.0 08/28/2011 2030    BMET    Component Value Date/Time   NA 130* 03/15/2012 0811   K 4.4 03/15/2012 0811   CL 89* 03/15/2012 0811   CO2 23 03/15/2012 0811   GLUCOSE 348* 03/15/2012 0811   BUN 49* 03/15/2012 0811   CREATININE 9.05* 03/15/2012 0811   CALCIUM 7.3* 03/15/2012 0811   CALCIUM 8.4 03/06/2011 0751   GFRNONAA 5* 03/15/2012 0811   GFRAA 6* 03/15/2012 0811    INR    Component Value Date/Time   INR 1.06 01/27/2010 1930     Intake/Output Summary (Last 24 hours) at 03/15/12 1042 Last data filed at 03/14/12 1400  Gross per 24 hour  Intake    200 ml  Output      0 ml  Net    200 ml     Assessment/Plan:  41 y.o. female is s/p  1. Placement of new right thigh AV graft  2. Ligation of left upper arm AV graft   POD 1  -doing well.  There is a bruit in the graft and it is patent.   -denies steal symptoms -home after HD-she has an Rx for pain meds on chart.   Doreatha Massed, PA-C [facility] [phone] 03/15/2012 10:42 AM

## 2012-03-15 NOTE — Progress Notes (Signed)
NURSING PROGRESS NOTE  [name] [id number] Discharge Data: 03/15/2012 4:49 PM Attending Provider: Chuck Hint, MD WUJ:[id number] Jonny Ruiz, MD     [name] to be D/C'd Home per MD order.  Discussed with the patient the After Visit Summary and all questions fully answered. All IV's discontinued with no bleeding noted. All belongings returned to patient for patient to take home.   Last Vital Signs:  Blood pressure 99/56, pulse 89, temperature 98.2 F (36.8 C), temperature source Oral, resp. rate 16, height 5\' 1"  (1.549 m), weight 74.4 kg (164 lb 0.4 oz), last menstrual period 02/20/2012, SpO2 95.00%.  Discharge Medication List   Medication List     As of 03/15/2012  4:49 PM    TAKE these medications         albuterol 108 (90 BASE) MCG/ACT inhaler   Commonly known as: PROVENTIL HFA;VENTOLIN HFA   Inhale 2 puffs into the lungs every 4 (four) hours as needed. For wheezing.      amLODipine-valsartan 10-160 MG per tablet   Commonly known as: EXFORGE   Take 1 tablet by mouth daily.      aspirin EC 81 MG tablet   Take 81 mg by mouth daily.      calcium acetate 667 MG capsule   Commonly known as: PHOSLO   Take 1,334-2,001 mg by mouth 3 (three) times daily with meals. Dosage depends on meal consumed      CELLCEPT 250 MG capsule   Generic drug: mycophenolate   Take 250 mg by mouth 2 (two) times daily.      clonazePAM 0.5 MG tablet   Commonly known as: KLONOPIN   Take 2 mg by mouth at bedtime.      insulin aspart 100 UNIT/ML injection   Commonly known as: novoLOG   Inject 3-8 Units into the skin 3 (three) times daily before meals. Inject 3 units three times daily.   in addition to sliding scale= 5-8 units.      insulin glargine 100 UNIT/ML injection   Commonly known as: LANTUS   Inject 7 Units into the skin 2 (two) times daily.      metoprolol succinate 100 MG 24 hr tablet   Commonly known as: TOPROL-XL   Take 100 mg by mouth 4 (four) times a week. Do not take  the night before dialysis. Dialysis on Tues, Thurs & Sat.      oxyCODONE 5 MG immediate release tablet   Commonly known as: Oxy IR/ROXICODONE   Take 1 tablet (5 mg total) by mouth every 6 (six) hours as needed for pain.      pantoprazole 40 MG tablet   Commonly known as: PROTONIX   Take 40 mg by mouth daily.      predniSONE 5 MG tablet   Commonly known as: DELTASONE   Take 1 tablet by mouth daily.      PROGRAF 1 MG capsule   Generic drug: tacrolimus   Take 1 mg by mouth 2 (two) times daily.      zolpidem 10 MG tablet   Commonly known as: AMBIEN   Take 10 mg by mouth at bedtime as needed. For insomnia.        Pranathi Winfree, Elmarie Mainland, RN

## 2012-03-17 LAB — GLUCOSE, CAPILLARY
Glucose-Capillary: 47 mg/dL — ABNORMAL LOW (ref 70–99)
Glucose-Capillary: 368 mg/dL — ABNORMAL HIGH (ref 70–99)

## 2012-03-17 NOTE — Discharge Summary (Signed)
Agree with plans for d/c today.  Di Kindle. Edilia Bo, MD, FACS Beeper [phone] 03/17/2012

## 2012-03-18 ENCOUNTER — Encounter (HOSPITAL_COMMUNITY): Payer: Self-pay | Admitting: Vascular Surgery

## 2012-04-01 ENCOUNTER — Encounter: Payer: Self-pay | Admitting: Vascular Surgery

## 2012-04-02 ENCOUNTER — Ambulatory Visit (INDEPENDENT_AMBULATORY_CARE_PROVIDER_SITE_OTHER): Payer: Medicare Other | Admitting: Vascular Surgery

## 2012-04-02 ENCOUNTER — Encounter: Payer: Self-pay | Admitting: Vascular Surgery

## 2012-04-02 VITALS — BP 152/73 | HR 77 | Ht 61.0 in | Wt 163.0 lb

## 2012-04-02 DIAGNOSIS — N186 End stage renal disease: Secondary | ICD-10-CM

## 2012-04-02 MED ORDER — OXYCODONE-ACETAMINOPHEN 5-325 MG PO TABS: 1.0000 | ORAL_TABLET | ORAL | Status: DC | PRN

## 2012-04-02 NOTE — Progress Notes (Signed)
[facility] [place]  Patient name: [name] MRN: [id number] DOB: 12-Dec-1970 Sex: female  REASON FOR VISIT: follow up after right thigh AV graft and ligation of left upper arm AV graft  HPI: [name] is a 41 y.o. female who underwent placement of a right thigh AV graft and ligation of left upper arm AV graft for venous hypertension in the left arm on 03/14/2012. In for a routine follow up visit. She notes some pain in the thigh with the graft has been tunneled. She's had no distal right leg pain. Arm swelling improved significantly after the graft was ligated.   REVIEW OF SYSTEMS: Arly.Keller ] denotes positive finding; [  ] denotes negative finding  CARDIOVASCULAR:  [ ]  chest pain   [ ]  dyspnea on exertion    CONSTITUTIONAL:  [ ]  fever   [ ]  chills  PHYSICAL EXAM: Filed Vitals:   04/02/12 0910  BP: 152/73  Pulse: 77  Height: 5\' 1"  (1.549 m)  Weight: 163 lb (73.936 kg)  SpO2: 100%   Body mass index is 30.80 kg/(m^2). GENERAL: The patient is a well-nourished female, in no acute distress. The vital signs are documented above. CARDIOVASCULAR: There is a regular rate and rhythm  PULMONARY: There is good air exchange bilaterally without wheezing or rales. She has no significant swelling in the left upper extremity. Her right thigh AV graft has an excellent bruit and thrill. She has a palpable pedal pulse on the right.  MEDICAL ISSUES: Her right thigh graft is working well and can be used for dialysis in 2 weeks. Once we know that it is working adequately she can have her catheter removed. I will see her back as needed.  Rael Tilly S [facility] [place] Beeper: [phone]

## 2012-04-16 ENCOUNTER — Encounter: Payer: Self-pay | Admitting: Vascular Surgery

## 2012-04-16 ENCOUNTER — Ambulatory Visit (INDEPENDENT_AMBULATORY_CARE_PROVIDER_SITE_OTHER): Payer: Medicare Other | Admitting: Vascular Surgery

## 2012-04-16 VITALS — BP 145/72 | HR 163 | Ht 61.0 in | Wt 166.0 lb

## 2012-04-16 DIAGNOSIS — N186 End stage renal disease: Secondary | ICD-10-CM

## 2012-04-16 DIAGNOSIS — T82898A Other specified complication of vascular prosthetic devices, implants and grafts, initial encounter: Secondary | ICD-10-CM
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
## 2012-04-21 ENCOUNTER — Encounter: Payer: Self-pay | Admitting: Internal Medicine

## 2012-04-21 ENCOUNTER — Ambulatory Visit (INDEPENDENT_AMBULATORY_CARE_PROVIDER_SITE_OTHER): Payer: Medicare Other | Admitting: Internal Medicine

## 2012-04-21 ENCOUNTER — Telehealth: Payer: Self-pay | Admitting: Internal Medicine

## 2012-04-21 VITALS — BP 134/60 | HR 83 | Temp 98.2°F | Resp 16 | Ht 60.0 in | Wt 168.0 lb

## 2012-04-21 DIAGNOSIS — E1029 Type 1 diabetes mellitus with other diabetic kidney complication: Secondary | ICD-10-CM

## 2012-04-21 DIAGNOSIS — N058 Unspecified nephritic syndrome with other morphologic changes: Secondary | ICD-10-CM

## 2012-04-21 MED ORDER — INSULIN ASPART 100 UNIT/ML ~~LOC~~ SOLN: SUBCUTANEOUS | Status: DC

## 2012-04-21 MED ORDER — GLUCAGON (RDNA) 1 MG IJ KIT: 1.0000 mg | PACK | Freq: Once | INTRAMUSCULAR | Status: DC | PRN

## 2012-04-21 NOTE — Patient Instructions (Addendum)
Please fill out your sugar logs and bring them at next appointment. Please call me if your sugars stay low. Please decrease your Novolog to 5 units and take it 3 times a day, 15 minutes before meals. In the dialysis days, you can take 3 units of Novolog with breakfast (instead of 5). Please decrease your Lantus to 6 units twice a day. Please try not to skip insulin doses. Please refill the glucagon kit and teach your boyfriend how to inject it. Please use the following sliding scale: 151-210: +1 unit 211-270: +2 units 271-330: +3 units 331-390: +4 units 391-450: +5 units   Basic Rules for Patients with Type I Diabetes Mellitus  1. The American Diabetes Association (ADA) recommended targets: - fasting sugar <130 - after meal sugar <180 - HbA1C <7%  2. Engage in ?150 min moderate exercise per week  3. Make sure you have ?8h of sleep every night as this helps both blood sugars and your weight.  4. Always keep a sugar log (not only record in your meter) and bring it to all appointments with [place].  5. "15-15 rule" for hypoglycemia: if sugars are low, take 15 g of carbs** ("fast sugar" - e.g. 4 glucose tablets, 4 oz orange juice), wait 15 min, then check sugars again. If still <80, repeat. Continue  until your sugars >80, then eat a normal meal.   6. Teach family members and coworkers to inject glucagon. Have a glucagon set at home and one at work. They should call 911 after using the set.  7. Check sugar before driving. If <100, correct, and only start driving if sugars rise ?295. Check sugar every hour when on a long drive.  8. Check sugar before exercising. If <100, correct, and only start exercising if sugars rise ?100. Check sugar every hour when on a long exercise routine and 1h after you finished exercising.   If >250, check urine for ketones. If you have moderate-large ketones in urine, do not start exercise. Hydrate yourself with clear liquids and correct the high sugar. Recheck  sugars and ketones before attempting to exercise.  Be aware that you might need less insulin when exercising.  *intense, short, exercise bursts can increase your sugars, but  *less intense, longer (>1h), exercise routines can decrease your sugars.  If you are on a pump, you might need to decrease your basal rate by 10% or more (or even disconnect your pump) while you exercise to prevent low sugars. Do not disconnect your pump by more than 3 hours at a time! You also might need to decrease your insulin bolus for the meal prior to your exercise time by 20% or more.  9. Make sure you have a MedAlert bracelet or pendant mentioning "Type I Diabetes Mellitus". If you have a prior episode of severe hypoglycemia or hypoglycemia unawareness, it should also mention this.  10. Please do not walk barefoot. Inspect your feet for sores/cuts and let us know if you have them.  11. Please call [facility] with any questions and concerns [phone]).   **E.g. of "fast carbs":   first choice (15 g):  1 tube glucose gel, GlucoPouch 15, 2 oz glucose liquid   second choice (15-16 g):  3 or 4 glucose tablets (best taken  with water), 15 Dextrose Bits chewable   third choice (15-20 g):   cup fruit juice,  cup regular soda, 1 cup skim milk,  1 cup sports drink   fourth choice (15-20 g):  1  small tube [place] gel (not frosting), 2 tbsp raisins, 1 tbsp table sugar,  candy, jelly beans, gum drops - check package for carb amount   (adapted from: Juluis Rainier. "Insulin therapy and hypoglycemia" Endocrinol Metab Clin N Am 2012, 41: 57-87)

## 2012-04-21 NOTE — Telephone Encounter (Signed)
error 

## 2012-04-21 NOTE — Progress Notes (Signed)
Subjective:     Patient ID: [name], female   DOB: [birth year], 41 y.o.   MRN: [id number]  HPI Pt is a 41 y/o AAW referred by PCP, Oliver Barre, MD, for management of DM1, uncontrolled, with wide fluctuations in CBGs, with complications (ESRD, on HD; DR).  Pt has had DM1 since childhood. She has extensive FH of DM2, not DM1.Last HbA1C was 9.8% (but can be inaccurate for pts on HD). She is now taking: - Lantus 7 units in am and 7 units in hs - Novolog 7 units in am before b'fast, 7 units before dinner, add SSI to these 2 and for lunch She does not have scheduled insulin for lunch.  Her target CBG is 200, but she does not know her sliding scale parameters. She denies missing any injections, however, she does not take insulin in am of her HD days (since she then might drop her sugars), but takes it when she comes home, around 10 am.  She checks her sugars 3x a day: - am: 100-150 - before lunch: 200-260 - before dinner: varies the most 100-300 Lowest sugar 2 mo ago: 23, in the hospital for surgery. Since then, she has approx. 3-4x/week: 30-40s. She overcorrects and then her sugars go high. She corrects with PB crackers and sodas.  She has hypoglycemia awareness. She does not have a glucagon pen.  Highest sugars: 450 (after pizza and regular soda).   She has HD TTS, dry weight is 74 kg. Usually she is 1 kg above this, today 2 kg above it but it is the second non-HD day. Her BP is not dropping during HD if using the catheter, only when using the leg fistula. Followed by Dr. Eliott Nine with nephrology  She is interested in an insulin pump.  She spoke on the phone intermittently throughout the visit.  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 in 2011 - Diabetic retinopathy - Hypertension - Hyperlipidemia - obesity -  Restless leg syndrome - History of chronic pain syndrome - Anemia of chronic disease - GERD - h/o medication noncompliance and multiple ED visits and admisions  Past Surgical History  Procedure Date  . 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 Thyroglobulin     x 7  . Av fistula placement     right upper arm  . Cholecystectomy 1995  .  hd graft placement/removal     "had 2 in my left upper arm"  . Eye surgery   . Retinopathy surgery   . Tooth extraction 10/10/11    X's two  . Insertion of dialysis catheter 12/08/2011    Procedure: INSERTION OF DIALYSIS CATHETER;  Surgeon: Chuck Hint, MD;  Location: [facility] OR;  Service: Vascular;  Laterality: Left;  . Av fistula placement 01/22/2012    Procedure: INSERTION OF ARTERIOVENOUS (AV) GORE-TEX GRAFT ARM;  Surgeon: Chuck Hint, MD;  Location: [facility] OR;  Service: Vascular;  Laterality: Left;  Ultrasound guided.  . Av fistula placement 03/14/2012    Procedure: INSERTION OF ARTERIOVENOUS (AV) GORE-TEX GRAFT THIGH;  Surgeon: Chuck Hint, MD;  Location: [facility] OR;  Service: Vascular;  Laterality: Right;   History   Social History  . Marital Status: Single    Spouse Name: N/A    Number of Children:  N/A  . Years of Education: N/A   Occupational History  . disabled    Social History Main Topics  . Smoking status: Never Smoker   . Smokeless tobacco: Never Used  . Alcohol Use: No  . Drug Use: No  . Sexually Active: Yes    Birth Control/ Protection: None   Other Topics Concern  . Not on file   Social History Narrative   Lives at home with fiance, has a 17yo daughter. Ambulatory without cane or walker. Never smoker, no drugs or alcohol. Lost mom last year, has no brothers or sisters.    Medication Sig  . albuterol (PROVENTIL HFA;VENTOLIN HFA) 108 (90 BASE) MCG/ACT inhaler Inhale 2 puffs into the lungs every 4 (four) hours as needed. For wheezing.   Marland Kitchen amLODipine-valsartan (EXFORGE)  10-160 MG per tablet Take 1 tablet by mouth daily.  Marland Kitchen aspirin EC 81 MG tablet Take 81 mg by mouth daily.  . calcium acetate (PHOSLO) 667 MG capsule Take 1,334-2,001 mg by mouth 3 (three) times daily with meals. Dosage depends on meal consumed  . clonazePAM (KLONOPIN) 0.5 MG tablet Take 2 mg by mouth at bedtime.   . insulin glargine (LANTUS) 100 UNIT/ML injection Inject 7 Units into the skin 2 (two) times daily.  . metoprolol succinate (TOPROL-XL) 100 MG 24 hr tablet Take 100 mg by mouth 4 (four) times a week. Do not take the night before dialysis. Dialysis on Tues, Thurs & Sat.  . mycophenolate (CELLCEPT) 250 MG capsule Take 250 mg by mouth daily.   . pantoprazole (PROTONIX) 40 MG tablet Take 40 mg by mouth daily.  . predniSONE (DELTASONE) 5 MG tablet Take 1 tablet by mouth daily.  . tacrolimus (PROGRAF) 1 MG capsule Take 1 mg by mouth daily.   Marland Kitchen zolpidem (AMBIEN) 10 MG tablet Take 10 mg by mouth at bedtime as needed. For insomnia.  .  Insulin aspart (NOVOLOG) 100 UNIT/ML injection Inject 3-8 Units into the skin 3 (three) times daily before meals. Inject 3 units three times daily.   in addition to sliding scale= 5-8 units.  Marland Kitchen oxyCODONE (ROXICODONE) 5 MG immediate release tablet Take 1 tablet (5 mg total) by mouth every 6 (six) hours as needed for pain.  Marland Kitchen oxyCODONE-acetaminophen (ROXICET) 5-325 MG per tablet Take 1-2 tablets by mouth every 4 (four) hours as needed for pain.  . [DISCONTINUED] albuterol (PROAIR HFA) 108 (90 BASE) MCG/ACT inhaler Inhale 2 puffs into the lungs every 4 (four) hours as needed for wheezing. 2 puffs every 4-6 hours as needed  . [DISCONTINUED] NORVASC 10 MG tablet Take 1 tablet by mouth Daily.   Allergies  Allergen Reactions  . Morphine And Related Nausea And Vomiting and Other (See Comments)    "makes me delerirous"  . Tramadol Nausea And Vomiting  . Vicodin (Hydrocodone-Acetaminophen) Rash   Family History  Problem Relation Age of Onset  . Hypertension Mother   .  Kidney disease Mother   . Diabetes Mother   . Hypertension Father   . Kidney disease Father   . Diabetes Father    Review of Systems ROS Constitutional: no weight gain/loss, no fatigue Eyes: no blurry vision, no xerophthalmia. She has 50% vision of left eye, from DR. ENT: has mild sore throat Cardiovascular: no CP/SOB/palpitations/leg swelling Respiratory: no cough/SOB Gastrointestinal: no N/V/D/C Musculoskeletal: no muscle/joint aches Skin: no rashes Neurological: no tremors/numbness/tingling/dizziness but has occasional pain in her legs with a sensation of having to move them Psychiatric: no depression/anxiety  Objective:   Physical Exam BP 134/60  Pulse 83  Temp 98.2 F (36.8 C) (Oral)  Resp 16  Ht 5' (1.524 m)  Wt 168 lb (76.204 kg)  BMI 32.81 kg/m2  SpO2 96% Wt Readings from Last 3 Encounters:  04/21/12 168 lb (76.204 kg)  04/16/12 166 lb (75.297 kg)  04/02/12 163 lb (73.936 kg)  Constitutional: overweight, in NAD Eyes: PERRLA, EOMI, no exophthalmos; exotropic left eye ENT: moist mucous membranes, no thyromegaly, no cervical lymphadenopathy Cardiovascular: RRR, No MRG Respiratory: CTA B Gastrointestinal: abdomen soft, NT, ND, BS+ Musculoskeletal: no deformities, strength intact in all 4 Skin: moist, warm, no rashes Neurological: no tremor with outstretched hands, DTR normal in all 4     Assessment:     1. DM1, uncontrolled, with complications (ESRD, retinopathy). - s/p renal/pancreatic transplant 2004, failed, still on Prednisone/Cellcept/Tacrolimus for possible residual pancreatic graft function; now back on HD since 2008 - was on [facility] transplant list but taken off due to noncompliance - multiple ED visits and admissions, including for DKA, HHNK, hypoglycemia  Plan:     - Pt has uncontrolled DM1, with severe complications, with wide CBG swings, and h/o medication noncompliance - I explained that it is very difficult to make changes in pt's insulin  regimen without a meter, sugar log or knowing her SSI - however, I did suggest an insulin regimen that she should try:   Please fill out your sugar logs and bring them at next appointment. Please call me if your sugars stay low. Please decrease your Novolog to 5 units and take it 3 times a day, 15 minutes before meals. In the dialysis days, you can take 3 units of Novolog with breakfast (instead of 5). Please decrease your Lantus to 6 units twice a day. Please try not to skip insulin doses. Please refill the glucagon kit and make sure your boyfriend knows how to inject it. Please use the following sliding scale: 151-210: +1 unit 211-270: +2 units 271-330: +3 units 331-390: +4 units 391-450: +5 units  - I gave her general DM1 instructions (see pt Instructions). We discussed proper ways to correct for lows, "15-15" rule, so that she does not overcorrect. - I refilled her Novolog and gave her Rx for glucagon kit - explained how to used them - I will send a Rx for a glucose meter to her pharmacy. - I explained that I do not believe that she qualifies for an insulin pump as of now, I will need to follow her for a period of time and see how she progresses before thinking about it.  To qualify for an insulin pump, a pt need to: - be highly motivated - have the capacity to program the pump - check sugars 4-8 times a day in the previous 2 months before starting the pump - respond to warning alarms - calculate pre-meal boluses based on carbohydrate intake Also, to be covered by [employer], a fasting C peptide need to be <110% of LLN while Glu <225. - I am wondering if her immune suppression can be stopped since she is now requiring full dose insulin... - No labs today. I do not see a C peptide result in Epic, but I am considering checking one in the future if this influences the decision to stop immune suppression - I will see her back in 1 month, with sugar logs and will then adjust her insulin  doses further. - our discussion was interrupted by multiple telephone calls to or from  patient

## 2012-04-22 ENCOUNTER — Telehealth: Payer: Self-pay | Admitting: *Deleted

## 2012-04-22 NOTE — Telephone Encounter (Signed)
Left message of need for patient to call back with information of glucose meter That her insurance would cover.

## 2012-04-22 NOTE — Telephone Encounter (Signed)
Message copied by Elnora Morrison on Tue Apr 22, 2012  3:26 PM ------      Message from: Carlus Pavlov      Created: Mon Apr 21, 2012  3:47 PM       Fannie Knee,      Can you please check with pt's insurance what glucose meter would be covered for her so I can send a Rx for the pharmacy?      Thank you so much,      Silvestre Mesi

## 2012-05-13 ENCOUNTER — Encounter: Payer: Self-pay | Admitting: Vascular Surgery

## 2012-05-14 ENCOUNTER — Encounter: Payer: Self-pay | Admitting: Vascular Surgery

## 2012-05-14 ENCOUNTER — Ambulatory Visit (INDEPENDENT_AMBULATORY_CARE_PROVIDER_SITE_OTHER): Payer: Medicare Other | Admitting: Vascular Surgery

## 2012-05-14 ENCOUNTER — Ambulatory Visit: Payer: Medicare Other | Admitting: Vascular Surgery

## 2012-05-14 VITALS — BP 152/87 | HR 76 | Resp 16 | Ht 61.0 in | Wt 164.0 lb

## 2012-05-14 DIAGNOSIS — M79609 Pain in unspecified limb: Secondary | ICD-10-CM

## 2012-05-14 DIAGNOSIS — N186 End stage renal disease: Secondary | ICD-10-CM

## 2012-05-14 MED ORDER — OXYCODONE-ACETAMINOPHEN 5-325 MG PO TABS: 1.0000 | ORAL_TABLET | ORAL | Status: DC | PRN

## 2012-05-14 NOTE — Progress Notes (Signed)
[facility] [place]  Patient name: [name] MRN: [id number] DOB: [birth year] Sex: female  REASON FOR VISIT: follow up of right thigh AV graft  HPI: [name] is a 41 y.o. female who had a right thigh AV graft placed on 03/14/2012. Recently they began using her graft and apparently had some hematoma. They used her graft on December 7 and then again on December 10. She has continued ecchymosis around the graft and has had some pain. She denies fever or chills.   REVIEW OF SYSTEMS: Arly.Keller ] denotes positive finding; [  ] denotes negative finding  CARDIOVASCULAR:  [ ]  chest pain   [ ]  dyspnea on exertion    CONSTITUTIONAL:  [ ]  fever   [ ]  chills  PHYSICAL EXAM: Filed Vitals:   05/14/12 1654  BP: 152/87  Pulse: 76  Resp: 16  Height: 5\' 1"  (1.549 m)  Weight: 164 lb (74.39 kg)  SpO2: 98%   Body mass index is 30.99 kg/(m^2). GENERAL: The patient is a well-nourished female, in no acute distress. The vital signs are documented above. CARDIOVASCULAR: There is a regular rate and rhythm  PULMONARY: There is good air exchange bilaterally without wheezing or rales. The graft has an excellent bruit and thrill. It is tunneled fairly superficial and should be easy to cannulate. There is some ecchymosis along the medial aspect of the thigh.  MEDICAL ISSUES: Patient developed some ecchymosis and hematoma around the graft after using it. I think they'll have to wait another 2-3 weeks before trying to cannulate the graft again. By exam there is no evidence of a problem with the graft. Specifically there is no evidence of graft infection. If she continues to have problems we could consider a duplex of the graft. However, currently based on her exam I think the graft should work fine once her small hematoma has resolved.  Harrol Novello S [facility] [place] Beeper: [phone]

## 2012-05-23 ENCOUNTER — Ambulatory Visit: Payer: Medicare Other | Admitting: Internal Medicine

## 2012-05-23 IMAGING — CR DG CHEST 2V
2 series · 2 of 2 positions shown · non-contrast
Comparison: 01/28/2010 and earlier.

CLINICAL DATA: 39-year-old female preoperative study.  In a renal
disease with infected graft.

CHEST - 2 VIEW

[view not recorded (1 of 2)]
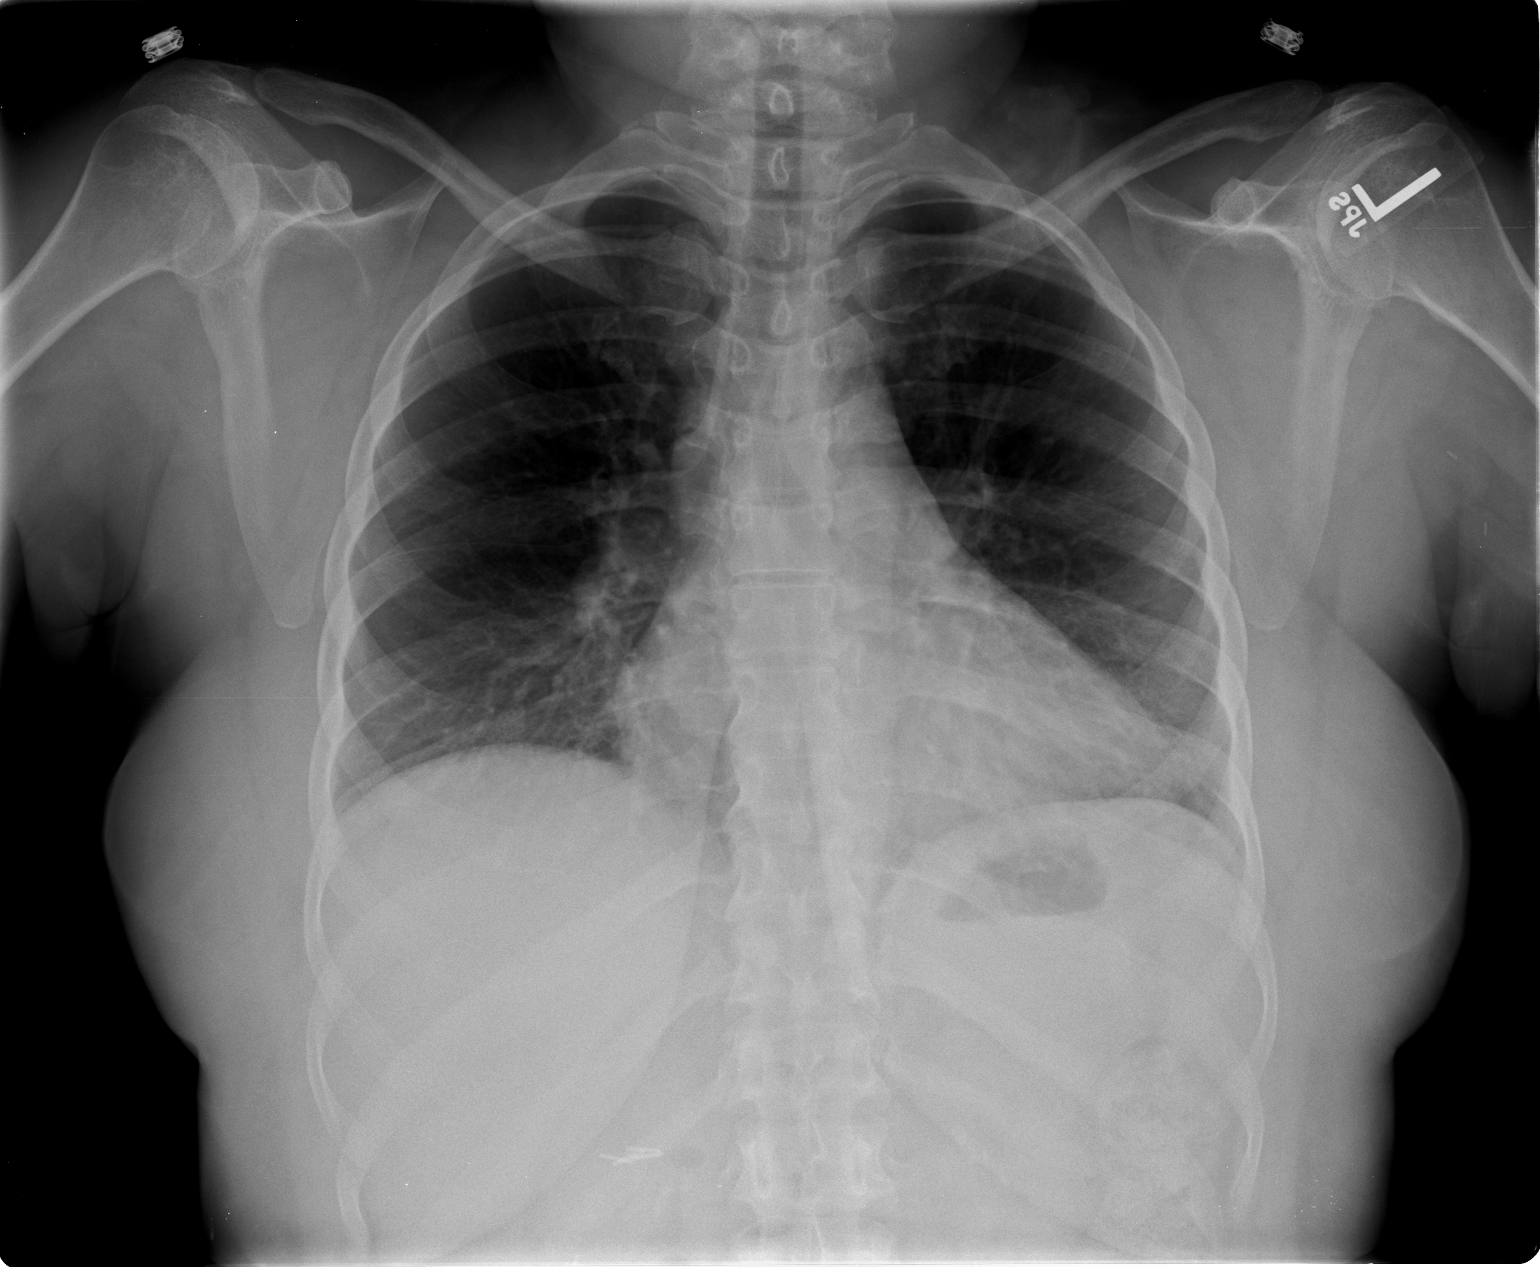

[view not recorded (2 of 2)]
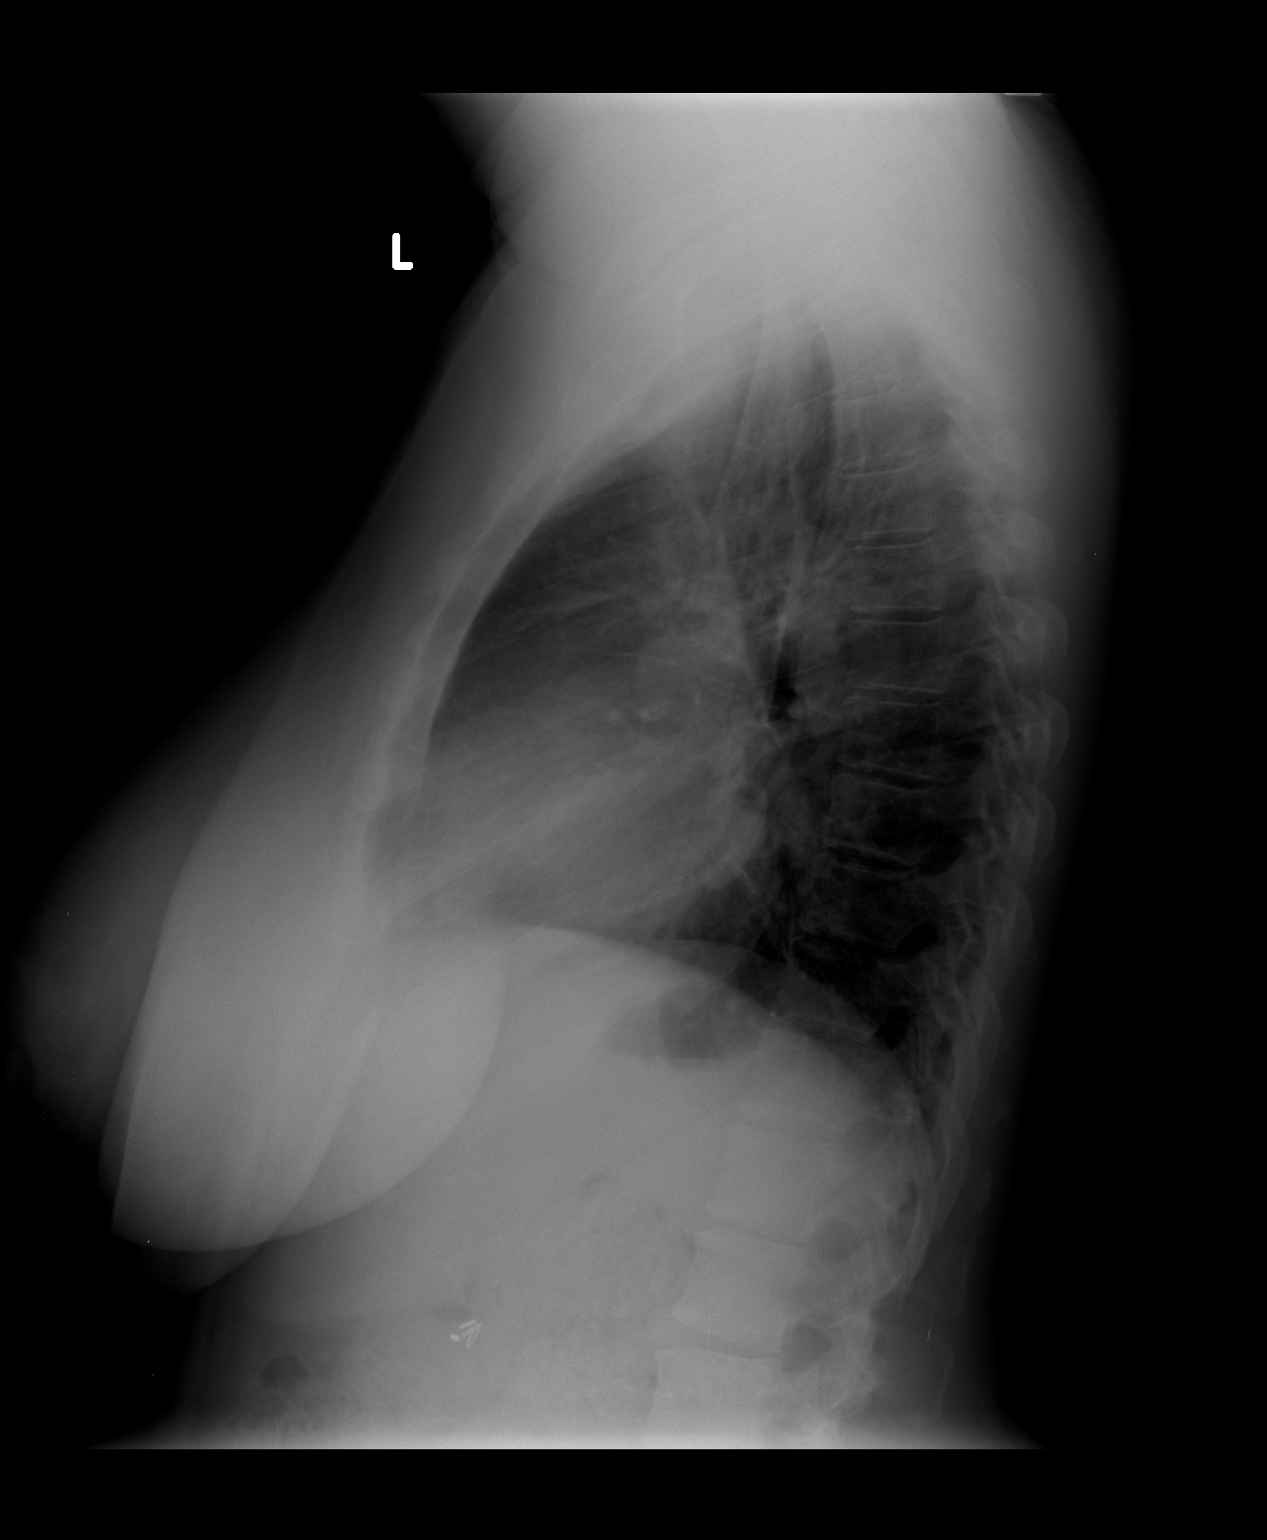

[2 of 2 positions shown; findings below may reference images not displayed]

FINDINGS: Interval removal of right IJ dual lumen catheter. Stable
cardiomegaly and mediastinal contours.  Lower lung volumes.  No
pneumothorax, pulmonary edema, pleural effusion or confluent
pulmonary opacity. Visualized tracheal air column is within normal
limits.  No acute osseous abnormality identified.
IMPRESSION: Stable cardiomegaly.  No acute cardiopulmonary abnormality.

## 2012-05-29 ENCOUNTER — Telehealth: Payer: Self-pay | Admitting: Internal Medicine

## 2012-05-29 ENCOUNTER — Ambulatory Visit: Payer: Medicare Other | Admitting: Internal Medicine

## 2012-05-29 ENCOUNTER — Telehealth: Payer: Self-pay

## 2012-05-29 DIAGNOSIS — E1029 Type 1 diabetes mellitus with other diabetic kidney complication: Secondary | ICD-10-CM

## 2012-05-29 DIAGNOSIS — E1010 Type 1 diabetes mellitus with ketoacidosis without coma: Secondary | ICD-10-CM

## 2012-05-29 MED ORDER — GLUCOSE BLOOD VI STRP: ORAL_STRIP | Status: DC

## 2012-05-29 MED ORDER — ONETOUCH ULTRA SYSTEM W/DEVICE KIT: PACK | Status: DC

## 2012-05-29 MED ORDER — ACCU-CHEK ADVANTAGE DIABETES KIT: PACK | Status: DC

## 2012-05-29 NOTE — Telephone Encounter (Signed)
[name], I think her insurance will approve OneTouch, so I sent that to her pharmacy.Marland KitchenMarland Kitchen

## 2012-05-29 NOTE — Telephone Encounter (Signed)
Pt called back stating ins. Will cover the accu check advantage meter

## 2012-05-29 NOTE — Telephone Encounter (Signed)
Sonya, I am not sure which meter is the newest one on the market - please tell pt to call her insurance and find out and I will send the Rx to her pharmacy.Marland KitchenMarland Kitchen

## 2012-05-29 NOTE — Telephone Encounter (Signed)
The patient called to reschedule appointment and to ask that Dr. Elvera Lennox call in the newest glucometer and strips for her to the [facility].  The patient stated that her insurance will cover it if it is the newest one on the market.  Please call the patient at [phone].

## 2012-05-29 NOTE — Telephone Encounter (Signed)
Of course... I will leave the OneTouch in there just in case, but also ordered the Accu-chek set and strips.

## 2012-06-02 ENCOUNTER — Ambulatory Visit: Payer: Medicare Other | Admitting: Internal Medicine

## 2012-06-05 ENCOUNTER — Ambulatory Visit: Payer: Medicare Other | Admitting: Internal Medicine

## 2012-06-06 ENCOUNTER — Ambulatory Visit: Payer: Medicare Other | Admitting: Internal Medicine

## 2012-06-06 ENCOUNTER — Telehealth: Payer: Self-pay | Admitting: *Deleted

## 2012-06-06 NOTE — Telephone Encounter (Signed)
Forms faxed back to [facility]. Patient will need PCP for other [facility]  Supplies back brace and heating pad.

## 2012-06-09 ENCOUNTER — Emergency Department (HOSPITAL_COMMUNITY): Payer: Medicare Other

## 2012-06-09 ENCOUNTER — Inpatient Hospital Stay (HOSPITAL_COMMUNITY)
Admission: EM | Admit: 2012-06-09 | Discharge: 2012-06-12 | DRG: 637 | Disposition: A | Discharge status: Home / Self Care | Payer: Medicare Other | Attending: Internal Medicine | Admitting: Internal Medicine

## 2012-06-09 ENCOUNTER — Encounter (HOSPITAL_COMMUNITY): Payer: Self-pay

## 2012-06-09 DIAGNOSIS — D649 Anemia, unspecified: Secondary | ICD-10-CM

## 2012-06-09 DIAGNOSIS — Z23 Encounter for immunization: Secondary | ICD-10-CM

## 2012-06-09 DIAGNOSIS — Z940 Kidney transplant status: Secondary | ICD-10-CM

## 2012-06-09 DIAGNOSIS — M79609 Pain in unspecified limb: Secondary | ICD-10-CM

## 2012-06-09 DIAGNOSIS — E1010 Type 1 diabetes mellitus with ketoacidosis without coma: Secondary | ICD-10-CM

## 2012-06-09 DIAGNOSIS — N186 End stage renal disease: Secondary | ICD-10-CM

## 2012-06-09 DIAGNOSIS — R05 Cough: Secondary | ICD-10-CM

## 2012-06-09 DIAGNOSIS — K5289 Other specified noninfective gastroenteritis and colitis: Secondary | ICD-10-CM | POA: Diagnosis not present

## 2012-06-09 DIAGNOSIS — N2581 Secondary hyperparathyroidism of renal origin: Secondary | ICD-10-CM

## 2012-06-09 DIAGNOSIS — Z0000 Encounter for general adult medical examination without abnormal findings: Secondary | ICD-10-CM

## 2012-06-09 DIAGNOSIS — L298 Other pruritus: Secondary | ICD-10-CM | POA: Diagnosis present

## 2012-06-09 DIAGNOSIS — F3289 Other specified depressive episodes: Secondary | ICD-10-CM | POA: Diagnosis present

## 2012-06-09 DIAGNOSIS — D899 Disorder involving the immune mechanism, unspecified: Secondary | ICD-10-CM

## 2012-06-09 DIAGNOSIS — E875 Hyperkalemia: Secondary | ICD-10-CM | POA: Diagnosis present

## 2012-06-09 DIAGNOSIS — R739 Hyperglycemia, unspecified: Secondary | ICD-10-CM

## 2012-06-09 DIAGNOSIS — E870 Hyperosmolality and hypernatremia: Principal | ICD-10-CM | POA: Diagnosis present

## 2012-06-09 DIAGNOSIS — T82898A Other specified complication of vascular prosthetic devices, implants and grafts, initial encounter: Secondary | ICD-10-CM

## 2012-06-09 DIAGNOSIS — E1065 Type 1 diabetes mellitus with hyperglycemia: Principal | ICD-10-CM | POA: Diagnosis present

## 2012-06-09 DIAGNOSIS — Z794 Long term (current) use of insulin: Secondary | ICD-10-CM

## 2012-06-09 DIAGNOSIS — E11319 Type 2 diabetes mellitus with unspecified diabetic retinopathy without macular edema: Secondary | ICD-10-CM | POA: Diagnosis present

## 2012-06-09 DIAGNOSIS — Z6830 Body mass index (BMI) 30.0-30.9, adult: Secondary | ICD-10-CM

## 2012-06-09 DIAGNOSIS — F329 Major depressive disorder, single episode, unspecified: Secondary | ICD-10-CM

## 2012-06-09 DIAGNOSIS — E1029 Type 1 diabetes mellitus with other diabetic kidney complication: Secondary | ICD-10-CM

## 2012-06-09 DIAGNOSIS — R51 Headache: Secondary | ICD-10-CM

## 2012-06-09 DIAGNOSIS — I10 Essential (primary) hypertension: Secondary | ICD-10-CM

## 2012-06-09 DIAGNOSIS — D631 Anemia in chronic kidney disease: Secondary | ICD-10-CM | POA: Diagnosis present

## 2012-06-09 DIAGNOSIS — Z992 Dependence on renal dialysis: Secondary | ICD-10-CM

## 2012-06-09 DIAGNOSIS — E785 Hyperlipidemia, unspecified: Secondary | ICD-10-CM | POA: Diagnosis present

## 2012-06-09 DIAGNOSIS — K219 Gastro-esophageal reflux disease without esophagitis: Secondary | ICD-10-CM | POA: Diagnosis present

## 2012-06-09 DIAGNOSIS — G2581 Restless legs syndrome: Secondary | ICD-10-CM

## 2012-06-09 DIAGNOSIS — Z7982 Long term (current) use of aspirin: Secondary | ICD-10-CM

## 2012-06-09 DIAGNOSIS — R112 Nausea with vomiting, unspecified: Secondary | ICD-10-CM | POA: Diagnosis present

## 2012-06-09 DIAGNOSIS — I120 Hypertensive chronic kidney disease with stage 5 chronic kidney disease or end stage renal disease: Secondary | ICD-10-CM | POA: Diagnosis present

## 2012-06-09 DIAGNOSIS — J209 Acute bronchitis, unspecified: Secondary | ICD-10-CM | POA: Diagnosis present

## 2012-06-09 DIAGNOSIS — L2989 Other pruritus: Secondary | ICD-10-CM | POA: Diagnosis present

## 2012-06-09 DIAGNOSIS — E1069 Type 1 diabetes mellitus with other specified complication: Principal | ICD-10-CM | POA: Diagnosis present

## 2012-06-09 DIAGNOSIS — E611 Iron deficiency: Secondary | ICD-10-CM

## 2012-06-09 DIAGNOSIS — E669 Obesity, unspecified: Secondary | ICD-10-CM | POA: Diagnosis present

## 2012-06-09 DIAGNOSIS — E1039 Type 1 diabetes mellitus with other diabetic ophthalmic complication: Secondary | ICD-10-CM | POA: Diagnosis present

## 2012-06-09 LAB — COMPREHENSIVE METABOLIC PANEL
AST: 217 U/L — ABNORMAL HIGH (ref 0–37)
BUN: 61 mg/dL — ABNORMAL HIGH (ref 6–23)
CO2: 20 mEq/L (ref 19–32)
Calcium: 9.2 mg/dL (ref 8.4–10.5)
Chloride: 88 mEq/L — ABNORMAL LOW (ref 96–112)
Creatinine, Ser: 8.14 mg/dL — ABNORMAL HIGH (ref 0.50–1.10)
GFR calc Af Amer: 6 mL/min — ABNORMAL LOW (ref >90)
GFR calc non Af Amer: 6 mL/min — ABNORMAL LOW (ref >90)
Glucose, Bld: 854 mg/dL (ref 70–99)
Total Bilirubin: 0.3 mg/dL (ref 0.3–1.2)

## 2012-06-09 LAB — CBC
HCT: 36.6 % (ref 36.0–46.0)
Hemoglobin: 11.4 g/dL — ABNORMAL LOW (ref 12.0–15.0)
MCH: 31.8 pg (ref 26.0–34.0)
MCHC: 31.1 g/dL (ref 30.0–36.0)
MCV: 102.2 fL — ABNORMAL HIGH (ref 78.0–100.0)

## 2012-06-09 LAB — BASIC METABOLIC PANEL
BUN: 64 mg/dL — ABNORMAL HIGH (ref 6–23)
CO2: 18 mEq/L — ABNORMAL LOW (ref 19–32)
Calcium: 9.2 mg/dL (ref 8.4–10.5)
Creatinine, Ser: 8.7 mg/dL — ABNORMAL HIGH (ref 0.50–1.10)
GFR calc non Af Amer: 5 mL/min — ABNORMAL LOW (ref >90)
Glucose, Bld: 181 mg/dL — ABNORMAL HIGH (ref 70–99)

## 2012-06-09 LAB — POTASSIUM: Potassium: 5.8 mEq/L — ABNORMAL HIGH (ref 3.5–5.1)

## 2012-06-09 LAB — GLUCOSE, CAPILLARY
Glucose-Capillary: >600 mg/dL (ref 70–99)
Glucose-Capillary: >600 mg/dL (ref 70–99)
Glucose-Capillary: 112 mg/dL — ABNORMAL HIGH (ref 70–99)

## 2012-06-09 MED ORDER — INSULIN ASPART 100 UNIT/ML ~~LOC~~ SOLN
0.0000 [IU] | Freq: Three times a day (TID) | SUBCUTANEOUS | Status: DC
  Administered 2012-06-10: 14:00:00 via SUBCUTANEOUS
  Administered 2012-06-10: 3 [IU] via SUBCUTANEOUS
  Administered 2012-06-11: 15 [IU] via SUBCUTANEOUS

## 2012-06-09 MED ORDER — ALBUTEROL SULFATE (5 MG/ML) 0.5% IN NEBU: 2.5000 mg | INHALATION_SOLUTION | RESPIRATORY_TRACT | Status: DC | PRN

## 2012-06-09 MED ORDER — SODIUM CHLORIDE 0.9 % IV SOLN: INTRAVENOUS | Status: DC

## 2012-06-09 MED ORDER — ALBUTEROL SULFATE (5 MG/ML) 0.5% IN NEBU
5.0000 mg | INHALATION_SOLUTION | Freq: Once | RESPIRATORY_TRACT | Status: AC
  Administered 2012-06-09: 5 mg via RESPIRATORY_TRACT
  Filled 2012-06-09: qty 1

## 2012-06-09 MED ORDER — ASPIRIN EC 81 MG PO TBEC
81.0000 mg | DELAYED_RELEASE_TABLET | Freq: Every day | ORAL | Status: DC
  Administered 2012-06-10 – 2012-06-12 (×4): 81 mg via ORAL
  Filled 2012-06-09 (×4): qty 1

## 2012-06-09 MED ORDER — AMLODIPINE BESYLATE 10 MG PO TABS
10.0000 mg | ORAL_TABLET | Freq: Every day | ORAL | Status: DC
  Administered 2012-06-10 – 2012-06-12 (×3): 10 mg via ORAL
  Filled 2012-06-09 (×3): qty 1

## 2012-06-09 MED ORDER — IPRATROPIUM BROMIDE 0.02 % IN SOLN
0.5000 mg | Freq: Four times a day (QID) | RESPIRATORY_TRACT | Status: DC
  Administered 2012-06-09: 0.5 mg via RESPIRATORY_TRACT
  Filled 2012-06-09: qty 2.5

## 2012-06-09 MED ORDER — CALCIUM ACETATE 667 MG PO CAPS
2001.0000 mg | ORAL_CAPSULE | Freq: Three times a day (TID) | ORAL | Status: DC
  Administered 2012-06-10 (×2): 2001 mg via ORAL
  Administered 2012-06-10: 2668 mg via ORAL
  Administered 2012-06-11 – 2012-06-12 (×5): 2001 mg via ORAL
  Filled 2012-06-09 (×10): qty 4

## 2012-06-09 MED ORDER — AMLODIPINE BESYLATE-VALSARTAN 10-160 MG PO TABS: 1.0000 | ORAL_TABLET | Freq: Every day | ORAL | Status: DC

## 2012-06-09 MED ORDER — INSULIN GLARGINE 100 UNIT/ML ~~LOC~~ SOLN
6.0000 [IU] | Freq: Two times a day (BID) | SUBCUTANEOUS | Status: DC
  Administered 2012-06-10 – 2012-06-11 (×4): 6 [IU] via SUBCUTANEOUS

## 2012-06-09 MED ORDER — ZOLPIDEM TARTRATE 5 MG PO TABS
10.0000 mg | ORAL_TABLET | Freq: Every day | ORAL | Status: DC
  Administered 2012-06-10: 10 mg via ORAL
  Filled 2012-06-09: qty 2

## 2012-06-09 MED ORDER — ONDANSETRON HCL 4 MG/2ML IJ SOLN
4.0000 mg | Freq: Four times a day (QID) | INTRAMUSCULAR | Status: DC | PRN
  Administered 2012-06-10 – 2012-06-11 (×4): 4 mg via INTRAVENOUS
  Filled 2012-06-09 (×4): qty 2

## 2012-06-09 MED ORDER — HYDROMORPHONE HCL PF 1 MG/ML IJ SOLN
1.0000 mg | INTRAMUSCULAR | Status: DC | PRN
  Administered 2012-06-10 – 2012-06-11 (×7): 1 mg via INTRAVENOUS
  Filled 2012-06-09 (×7): qty 1

## 2012-06-09 MED ORDER — ACETAMINOPHEN 325 MG PO TABS: 650.0000 mg | ORAL_TABLET | Freq: Four times a day (QID) | ORAL | Status: DC | PRN

## 2012-06-09 MED ORDER — GUAIFENESIN-DM 100-10 MG/5ML PO SYRP
5.0000 mL | ORAL_SOLUTION | ORAL | Status: DC | PRN
  Filled 2012-06-09: qty 5

## 2012-06-09 MED ORDER — IRBESARTAN 150 MG PO TABS
150.0000 mg | ORAL_TABLET | Freq: Every day | ORAL | Status: DC
  Administered 2012-06-10 – 2012-06-12 (×3): 150 mg via ORAL
  Filled 2012-06-09 (×3): qty 1

## 2012-06-09 MED ORDER — MYCOPHENOLATE MOFETIL 250 MG PO CAPS
250.0000 mg | ORAL_CAPSULE | Freq: Every day | ORAL | Status: DC
  Administered 2012-06-10: 250 mg via ORAL
  Filled 2012-06-09 (×2): qty 1

## 2012-06-09 MED ORDER — INSULIN ASPART 100 UNIT/ML ~~LOC~~ SOLN: 0.0000 [IU] | Freq: Every day | SUBCUTANEOUS | Status: DC

## 2012-06-09 MED ORDER — ONDANSETRON HCL 4 MG PO TABS
4.0000 mg | ORAL_TABLET | Freq: Four times a day (QID) | ORAL | Status: DC | PRN
  Administered 2012-06-11: 4 mg via ORAL
  Filled 2012-06-09: qty 1

## 2012-06-09 MED ORDER — HYDROMORPHONE HCL PF 1 MG/ML IJ SOLN
1.0000 mg | Freq: Once | INTRAMUSCULAR | Status: AC
  Administered 2012-06-09: 1 mg via INTRAVENOUS
  Filled 2012-06-09: qty 1

## 2012-06-09 MED ORDER — ONDANSETRON HCL 4 MG/2ML IJ SOLN
4.0000 mg | Freq: Once | INTRAMUSCULAR | Status: AC
  Administered 2012-06-09: 4 mg via INTRAVENOUS
  Filled 2012-06-09: qty 2

## 2012-06-09 MED ORDER — PANTOPRAZOLE SODIUM 40 MG IV SOLR
40.0000 mg | Freq: Two times a day (BID) | INTRAVENOUS | Status: DC
  Administered 2012-06-10 – 2012-06-11 (×4): 40 mg via INTRAVENOUS
  Filled 2012-06-09 (×5): qty 40

## 2012-06-09 MED ORDER — INSULIN GLARGINE 100 UNIT/ML ~~LOC~~ SOLN: 6.0000 [IU] | Freq: Two times a day (BID) | SUBCUTANEOUS | Status: DC

## 2012-06-09 MED ORDER — ALUM & MAG HYDROXIDE-SIMETH 200-200-20 MG/5ML PO SUSP
30.0000 mL | Freq: Four times a day (QID) | ORAL | Status: DC | PRN
  Filled 2012-06-09: qty 30

## 2012-06-09 MED ORDER — SODIUM CHLORIDE 0.9 % IJ SOLN
3.0000 mL | Freq: Two times a day (BID) | INTRAMUSCULAR | Status: DC
  Administered 2012-06-10 – 2012-06-12 (×6): 3 mL via INTRAVENOUS

## 2012-06-09 MED ORDER — CLONAZEPAM 1 MG PO TABS
2.0000 mg | ORAL_TABLET | Freq: Every day | ORAL | Status: DC
  Administered 2012-06-10 – 2012-06-11 (×3): 2 mg via ORAL
  Filled 2012-06-09 (×3): qty 2

## 2012-06-09 MED ORDER — PANTOPRAZOLE SODIUM 40 MG PO TBEC: 40.0000 mg | DELAYED_RELEASE_TABLET | Freq: Every day | ORAL | Status: DC

## 2012-06-09 MED ORDER — INSULIN REGULAR HUMAN 100 UNIT/ML IJ SOLN
INTRAMUSCULAR | Status: DC
  Administered 2012-06-09: 7.9 [IU]/h via INTRAVENOUS
  Filled 2012-06-09: qty 1

## 2012-06-09 MED ORDER — ENOXAPARIN SODIUM 30 MG/0.3ML ~~LOC~~ SOLN
30.0000 mg | SUBCUTANEOUS | Status: DC
  Administered 2012-06-11: 30 mg via SUBCUTANEOUS
  Filled 2012-06-09 (×4): qty 0.3

## 2012-06-09 MED ORDER — TACROLIMUS 1 MG PO CAPS
1.0000 mg | ORAL_CAPSULE | Freq: Every day | ORAL | Status: DC
  Administered 2012-06-10: 1 mg via ORAL
  Filled 2012-06-09 (×2): qty 1

## 2012-06-09 MED ORDER — ALBUTEROL SULFATE (5 MG/ML) 0.5% IN NEBU
2.5000 mg | INHALATION_SOLUTION | Freq: Four times a day (QID) | RESPIRATORY_TRACT | Status: DC
  Administered 2012-06-09: 2.5 mg via RESPIRATORY_TRACT
  Filled 2012-06-09: qty 0.5

## 2012-06-09 MED ORDER — ACETAMINOPHEN 650 MG RE SUPP: 650.0000 mg | Freq: Four times a day (QID) | RECTAL | Status: DC | PRN

## 2012-06-09 MED ORDER — IPRATROPIUM BROMIDE 0.02 % IN SOLN
0.5000 mg | Freq: Once | RESPIRATORY_TRACT | Status: AC
  Administered 2012-06-09: 0.5 mg via RESPIRATORY_TRACT
  Filled 2012-06-09: qty 2.5

## 2012-06-09 MED ORDER — METOPROLOL SUCCINATE ER 100 MG PO TB24
100.0000 mg | ORAL_TABLET | ORAL | Status: DC
  Administered 2012-06-10 – 2012-06-12 (×2): 100 mg via ORAL
  Filled 2012-06-09 (×2): qty 1

## 2012-06-09 MED ORDER — PREDNISONE 5 MG PO TABS
5.0000 mg | ORAL_TABLET | Freq: Every day | ORAL | Status: DC
  Administered 2012-06-10 – 2012-06-12 (×4): 5 mg via ORAL
  Filled 2012-06-09 (×4): qty 1

## 2012-06-09 MED ORDER — SODIUM CHLORIDE 0.9 % IV SOLN
1000.0000 mL | INTRAVENOUS | Status: DC
  Administered 2012-06-09: 1000 mL via INTRAVENOUS

## 2012-06-09 NOTE — ED Notes (Signed)
Meal given to pt per Admitting MD request.

## 2012-06-09 NOTE — ED Provider Notes (Signed)
History     CSN: [id number]  Arrival date & time 06/09/12  1222   None     Chief Complaint  Patient presents with  . Hyperglycemia  . Cough    (Consider location/radiation/quality/duration/timing/severity/associated sxs/prior treatment) The history is provided by the patient and medical records.    [name] is a 42 y.o. 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  Patient states she gets dialysis Tuesday, Thursday and Saturday. She states she went this Saturday and had dialysis without complications. She states her blood sugar has been running high for the past week or so and she has been using a sliding scale in an effort to get it to come down but it has been consistently in the 800s.   Primary care physician only sees her nephrologist. He states the only reason she came today is because of her hyperglycemia. Associated symptoms include cough, shortness of breath.  Nothing makes it better that she has tried using her albuterol inhaler and nothing makes it worse.  Pt denies fever, chills, headache, neck pain, chest pain, abdominal pain, nausea, vomiting, diarrhea, weakness, dizziness, swelling of her hands or feet, syncope.  Patient does not make urine.     Past Medical History  Diagnosis Date  . Hyperlipidemia   . Alopecia   . Obesity   . Iron deficiency   . 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 RLS (restless legs syndrome)   . Respiratory failure   . Injury of conjunctiva and corneal abrasion of right eye without foreign body   . Cellulitis   . Dialysis patient     Tues; Thurs; Sat; [place]  . Angina   . Immunosuppression 08/01/11    currently takes Prednisone, MMF, and tacrolimus     . Anemia   . Pneumonia 2012    "double"  . Blood transfusion 2004    "when I had my transplant"  . Migraine   . 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 Hyperparathyroidism, secondary   . Diabetic retinopathy(362.0)   . Hypertension     takes Metoprolol and Exforge daily  . Depression     takes Klonopin nightly  . Diabetes mellitus type 1     Pt states diagnosed at age 61 with prior episodes of DKA. S/p pancreatic transplant   . GERD (gastroesophageal reflux disease)     takes Protonix daily  . PONV (postoperative nausea and vomiting)   . Bronchitis   . Migraine   . Asthma   . Shortness of breath   . Emphysema of lung     Past Surgical History  Procedure Date  . 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 Thyroglobulin     x 7  . Av fistula placement     right upper arm  . Cholecystectomy 1995  .  hd graft placement/removal     "had 2 in my left upper arm"  . Eye surgery   . Retinopathy surgery   . Tooth extraction 10/10/11    X's two  . Insertion of dialysis catheter 12/08/2011    Procedure: INSERTION OF DIALYSIS CATHETER;  Surgeon: Chuck Hint, MD;  Location: [facility] OR;  Service: Vascular;  Laterality: Left;  . Av fistula placement 01/22/2012    Procedure: INSERTION OF ARTERIOVENOUS (AV) GORE-TEX GRAFT ARM;  Surgeon: Chuck Hint, MD;  Location: [facility] OR;  Service: Vascular;  Laterality: Left;  Ultrasound guided.  . Av fistula placement 03/14/2012    Procedure: INSERTION OF ARTERIOVENOUS (AV) GORE-TEX GRAFT THIGH;  Surgeon: Chuck Hint, MD;  Location: [facility] OR;  Service: Vascular;  Laterality: Right;    Family History  Problem Relation Age of Onset  . Hypertension Mother   . Kidney disease Mother   . Diabetes Mother   . Hypertension Father   . Kidney disease Father   . Diabetes Father     History  Substance Use Topics  . Smoking status: Never Smoker   . Smokeless tobacco: Never  Used  . Alcohol Use: No    OB History    Grav Para Term Preterm Abortions TAB SAB Ect Mult Living                  Review of Systems  Constitutional: Negative for fever, diaphoresis, appetite change, fatigue and unexpected weight change.  HENT: Negative for mouth sores and neck stiffness.   Eyes: Negative for visual disturbance.  Respiratory: Positive for cough and shortness of breath. Negative for chest tightness and wheezing.   Cardiovascular: Negative for chest pain.  Gastrointestinal: Negative for nausea, vomiting, abdominal pain, diarrhea and constipation.  Genitourinary: Negative for dysuria, urgency, frequency and hematuria.  Skin: Negative for rash.  Neurological: Negative for syncope, light-headedness and headaches.  Psychiatric/Behavioral: Negative for sleep disturbance. The patient is not nervous/anxious.   All other systems reviewed and are negative.    Allergies  Morphine and related; Tramadol; and Vicodin  Home Medications   Current Outpatient Rx  Name  Route  Sig  Dispense  Refill  . ALBUTEROL SULFATE HFA 108 (90 BASE) MCG/ACT IN AERS   Inhalation   Inhale 2 puffs into the lungs every 4 (four) hours as needed. For wheezing.          Marland Kitchen AMLODIPINE BESYLATE-VALSARTAN 10-160 MG PO TABS   Oral   Take 1 tablet by mouth daily.         . ASPIRIN EC 81 MG PO TBEC   Oral   Take 81 mg by mouth daily.         Marland Kitchen CALCIUM ACETATE 667 MG PO CAPS   Oral   Take 2,001-2,668 mg by mouth 3 (three) times daily with meals. Dosage depends on meal consumed         . CLONAZEPAM 0.5 MG PO TABS   Oral   Take 2 mg by mouth at bedtime.          . INSULIN ASPART 100 UNIT/ML [place] SOLN   Subcutaneous   Inject 15-20 Units into the skin 3 (three) times daily before meals. Inject approximately 15-20 units under skin as advised         . INSULIN GLARGINE 100 UNIT/ML [place] SOLN   Subcutaneous   Inject 6 Units into the skin 2 (two) times daily.          Marland Kitchen METOPROLOL  SUCCINATE ER 100 MG PO TB24   Oral   Take 100 mg by mouth 4 (four) times a week. Do not take the night before dialysis. Dialysis on Tues, Thurs & Sat.         Marland Kitchen MYCOPHENOLATE MOFETIL 250 MG PO CAPS   Oral   Take 250 mg  by mouth daily.          Marland Kitchen PANTOPRAZOLE SODIUM 40 MG PO TBEC   Oral   Take 40 mg by mouth daily.         Marland Kitchen PREDNISONE 5 MG PO TABS   Oral   Take 1 tablet by mouth daily.         Marland Kitchen TACROLIMUS 1 MG PO CAPS   Oral   Take 1 mg by mouth daily.          Marland Kitchen ZOLPIDEM TARTRATE 10 MG PO TABS   Oral   Take 10 mg by mouth at bedtime. For insomnia.         Marland Kitchen ACCU-CHEK ADVANTAGE DIABETES KIT      Use as instructed   1 each   0   . ONETOUCH ULTRA SYSTEM W/DEVICE KIT      Use as advised   1 each   0   . GLUCAGON (RDNA) 1 MG IJ KIT   Intravenous   Inject 1 mg into the vein once as needed.   2 each   1   . GLUCOSE BLOOD VI STRP      Use as instructed, check sugars 4-8 times a day   200 each   11   . GLUCOSE BLOOD VI STRP      Use as instructed, check sugars 4-8 times a day   200 each   prn     BP 141/67  Pulse 88  Temp 97.4 F (36.3 C) (Oral)  Resp 15  SpO2 96%  LMP 05/14/2012  Physical Exam  Nursing note and vitals reviewed. Constitutional: She is oriented to person, place, and time. She appears well-developed and well-nourished. No distress.  HENT:  Head: Normocephalic and atraumatic.  Mouth/Throat: Oropharynx is clear and moist. No oropharyngeal exudate.  Eyes: Conjunctivae normal are normal. Pupils are equal, round, and reactive to light. No scleral icterus.  Neck: Normal range of motion. Neck supple.  Cardiovascular: Normal rate, regular rhythm, normal heart sounds and intact distal pulses.  Exam reveals no gallop and no friction rub.   No murmur heard. Pulmonary/Chest: Effort normal. No accessory muscle usage. Not tachypneic. No respiratory distress. She has decreased breath sounds (throughout). She has no wheezes. She has no  rhonchi. She has no rales.  Abdominal: Soft. Bowel sounds are normal. She exhibits no distension and no mass. There is no tenderness. There is no rebound and no guarding.  Musculoskeletal: Normal range of motion. She exhibits no edema and no tenderness.       No peripheral edema  Lymphadenopathy:    She has no cervical adenopathy.  Neurological: She is alert and oriented to person, place, and time. She exhibits normal muscle tone. Coordination normal.       Speech is clear and goal oriented Moves extremities without ataxia  Skin: Skin is warm and dry. No rash noted. She is not diaphoretic. No erythema. No pallor.  Psychiatric: She has a normal mood and affect.    ED Course  Procedures (including critical care time)  Labs Reviewed  CBC - Abnormal; Notable for the following:    RBC 3.58 (*)     Hemoglobin 11.4 (*)     MCV 102.2 (*)     All other components within normal limits  COMPREHENSIVE METABOLIC PANEL - Abnormal; Notable for the following:    Sodium 125 (*)     Potassium 6.3 (*)     Chloride 88 (*)  Glucose, Bld 854 (*)     BUN 61 (*)     Creatinine, Ser 8.14 (*)     AST 217 (*)     ALT 89 (*)     Alkaline Phosphatase 146 (*)     GFR calc non Af Amer 6 (*)     GFR calc Af Amer 6 (*)     All other components within normal limits  GLUCOSE, CAPILLARY - Abnormal; Notable for the following:    Glucose-Capillary >600 (*)     All other components within normal limits  GLUCOSE, CAPILLARY - Abnormal; Notable for the following:    Glucose-Capillary >600 (*)     All other components within normal limits  POTASSIUM - Abnormal; Notable for the following:    Potassium 5.8 (*)     All other components within normal limits  GLUCOSE, CAPILLARY - Abnormal; Notable for the following:    Glucose-Capillary 508 (*)     All other components within normal limits  GLUCOSE, CAPILLARY - Abnormal; Notable for the following:    Glucose-Capillary 288 (*)     All other components within  normal limits  GLUCOSE, CAPILLARY - Abnormal; Notable for the following:    Glucose-Capillary 129 (*)     All other components within normal limits  URINALYSIS, ROUTINE W REFLEX MICROSCOPIC  BASIC METABOLIC PANEL   Dg Chest 2 View  06/09/2012  *RADIOLOGY REPORT*  Clinical Data: Cough, shortness of breath, history hypertension, diabetes, end-stage renal disease, asthma  CHEST - 2 VIEW  Comparison: 12/08/2011  Findings: Left jugular dual-lumen central venous catheter with distal tip projecting over cavoatrial junction. Enlargement of cardiac silhouette with pulmonary vascular congestion. Question minimal perihilar edema particularly on the right. No gross pleural effusion or pneumothorax. Bones diffusely demineralized.  IMPRESSION: Enlargement of cardiac silhouette with pulmonary vascular congestion and question minimal pulmonary edema/failure.   Original Report Authenticated By: Ulyses Southward, M.D.     ECG:  Date: 06/09/2012  Rate: 82  Rhythm: normal sinus rhythm  QRS Axis: normal  Intervals: PR prolonged  ST/T Wave abnormalities: nonspecific T wave changes and peaked t waves V2-V3  Conduction Disutrbances:first-degree A-V block   Narrative Interpretation: nonischemic ECG  Old EKG Reviewed: unchanged    1. Hyperglycemia   2. End stage renal disease   3. DM (diabetes mellitus), type 1 with renal complications   4. Hyperlipidemia   5. Hyperkalemia   6. Dialysis patient   7. Anemia   8. Immunosuppression       MDM  [name] presents with her glycemia and cough.  Patient with a hyperglycemia with CBG of 854, hyperkalemic at 6.3.  Anion gap 17.  Elevated BUN and creatinine, not above patient's baseline.  Hemoglobin of 11.4, patient has a history of anemia chronic disease. ECG with peaked T waves but nonischemic, no evidence of arrhythmia.  Chest x-ray with enlargement cardiac study with silhouette, actually congestion and possibly pulmonary edema.  Patient on a glucose stabilizer  was last glucose 129. Patient repeat potassium 5.8.  Discussed with [facility], they will admit.  Dr. Mancel Bale was consulted, evaluated this patient with me and agrees with the plan.         Dahlia Client Zaiah Eckerson, PA-C 06/09/12 2107

## 2012-06-09 NOTE — ED Notes (Signed)
Pt states her sugar has been elevated for the past 2 months and wasn't told that it was as high as 800. Has been using the sliding scale and has been able to get her sugar down some. Has a new port in her right thigh but still currently using port in her chest. Also c/o cough for about a week that is getting worse.

## 2012-06-09 NOTE — H&P (Addendum)
History and Physical       Hospital Admission Note Date: 06/09/2012  Patient name: [name] Medical record number: [id number] Date of birth: [birth year] Age: 42 y.o. Gender: female PCP: Oliver Barre, MD  Primary nephrologist: Dr. Casimiro Needle  Chief Complaint:  Nausea vomiting with hyperglycemia  HPI: Patient is a 41 year old female with history of insulin-dependent diabetes mellitus, end-stage renal disease on HD TTS, 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 She had her usual hemodialysis on Saturday without any difficulty. Per patient her blood sugars have been consistently running high and 800s for the past week and despite Lantus and sliding scale insulin she has not been able to get it down. Patient came to the emergency room for nausea and vomiting for last 2 days and consistently elevated blood sugars. She also report having fever last week of 101. In the ED patient was started on glucose stabilizer, her blood sugars has improved to 181. Patient also reports that she was wheezing at the time of arrival to ED. [facility] was consulted for further management.   Review of Systems:  Constitutional: Denies diaphoresis, appetite change and fatigue.  HEENT: Denies photophobia, eye pain, redness, hearing loss, ear pain, congestion, sore throat, rhinorrhea, sneezing, mouth sores, trouble swallowing, neck pain, neck stiffness and tinnitus.   Respiratory: Denies SOB, DOE, cough, chest tightness,  and wheezing.   Cardiovascular: Denies chest pain, palpitations and leg swelling.  Gastrointestinal: See history of present illness Genitourinary: Denies dysuria, urgency, frequency, hematuria, flank pain and difficulty urinating.  Musculoskeletal: Denies myalgias, back pain, joint swelling, arthralgias and gait problem.  Skin: Denies pallor,  rash and wound.  Neurological: Denies dizziness, seizures, syncope, weakness, light-headedness, numbness and headaches.  Hematological: Denies adenopathy. Easy bruising, personal or family bleeding history  Psychiatric/Behavioral: Denies suicidal ideation, mood changes, confusion, nervousness, sleep disturbance and agitation  Past Medical History: Past Medical History  Diagnosis Date  . Hyperlipidemia   . Alopecia   . Obesity   . Iron deficiency   . 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 RLS (restless legs syndrome)   . Respiratory failure   . Injury of conjunctiva and corneal abrasion of right eye without foreign body   . Cellulitis   . Dialysis patient     Tues; Thurs; Sat; [place]  . Angina   . 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 Anemia   . Pneumonia 2012    "double"  . Blood transfusion 2004    "when I had my transplant"  . Migraine   . 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 Hyperparathyroidism, secondary   . Diabetic retinopathy(362.0)   . Hypertension     takes Metoprolol and Exforge daily  . Depression     takes Klonopin nightly  . Diabetes mellitus type 1     Pt states diagnosed at age 42 with prior episodes of DKA. S/p pancreatic transplant   . GERD (gastroesophageal reflux disease)     takes Protonix daily  . PONV (postoperative nausea and vomiting)   . Bronchitis   . Migraine   . Asthma   . Shortness of breath   . Emphysema of lung    Past Surgical History  Procedure Date  . 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 Thyroglobulin     x 7  . Av fistula placement     right upper arm  . Cholecystectomy 1995  .  hd graft placement/removal     "had 2 in my left upper arm"  . Eye surgery   . Retinopathy surgery   . Tooth extraction 10/10/11    X's two  . Insertion of dialysis catheter  12/08/2011    Procedure: INSERTION OF DIALYSIS CATHETER;  Surgeon: Chuck Hint, MD;  Location: [facility] OR;  Service: Vascular;  Laterality: Left;  . Av fistula placement 01/22/2012    Procedure: INSERTION OF ARTERIOVENOUS (AV) GORE-TEX GRAFT ARM;  Surgeon: Chuck Hint, MD;  Location: [facility] OR;  Service: Vascular;  Laterality: Left;  Ultrasound guided.  . Av fistula placement 03/14/2012    Procedure: INSERTION OF ARTERIOVENOUS (AV) GORE-TEX GRAFT THIGH;  Surgeon: Chuck Hint, MD;  Location: [facility] OR;  Service: Vascular;  Laterality: Right;    Medications: Prior to Admission medications   Medication Sig Start Date End Date Taking? Authorizing Provider  albuterol (PROVENTIL HFA;VENTOLIN HFA) 108 (90 BASE) MCG/ACT inhaler Inhale 2 puffs into the lungs every 4 (four) hours as needed. For wheezing.  04/23/11  Yes Nyoka Cowden, MD  amLODipine-valsartan (EXFORGE) 10-160 MG per tablet Take 1 tablet by mouth daily. 06/19/11 06/18/12 Yes Nyoka Cowden, MD  aspirin EC 81 MG tablet Take 81 mg by mouth daily.   Yes Historical Provider, MD  calcium acetate (PHOSLO) 667 MG capsule Take 2,001-2,668 mg by mouth 3 (three) times daily with meals. Dosage depends on meal consumed 03/12/11  Yes Historical Provider, MD  clonazePAM (KLONOPIN) 0.5 MG tablet Take 2 mg by mouth at bedtime.    Yes Historical Provider, MD  insulin aspart (NOVOLOG) 100 UNIT/ML injection Inject 15-20 Units into the skin 3 (three) times daily before meals. Inject approximately 15-20 units under skin as advised 04/21/12  Yes Carlus Pavlov, MD  insulin glargine (LANTUS) 100 UNIT/ML injection Inject 6 Units into the skin 2 (two) times daily.    Yes Historical Provider, MD  metoprolol succinate (TOPROL-XL) 100 MG 24 hr tablet Take 100 mg by mouth 4 (four) times a week. Do not take the night before dialysis. Dialysis on Tues, Thurs & Sat.   Yes Historical Provider, MD  mycophenolate (CELLCEPT) 250 MG capsule Take 250 mg by mouth  daily.    Yes Historical Provider, MD  pantoprazole (PROTONIX) 40 MG tablet Take 40 mg by mouth daily.   Yes Historical Provider, MD  predniSONE (DELTASONE) 5 MG tablet Take 1 tablet by mouth daily. 01/21/11  Yes Historical Provider, MD  tacrolimus (PROGRAF) 1 MG capsule Take 1 mg by mouth daily.    Yes Historical Provider, MD  zolpidem (AMBIEN) 10 MG tablet Take 10 mg by mouth at bedtime. For insomnia.   Yes Historical Provider, MD  Blood Glucose Monitoring Suppl (ACCU-CHEK ADVANTAGE DIABETES) kit Use as instructed 05/29/12   Carlus Pavlov, MD  Blood Glucose Monitoring Suppl (ONE TOUCH ULTRA SYSTEM KIT) W/DEVICE KIT Use as advised 05/29/12   Carlus Pavlov, MD  glucagon (GLUCAGON EMERGENCY) 1 MG injection Inject 1 mg into the vein once as needed. 04/21/12   Carlus Pavlov, MD  glucose blood (ACCU-CHEK ADVANTAGE TEST) test strip Use as instructed, check sugars 4-8 times a day 05/29/12   Carlus Pavlov, MD  glucose blood (ONE TOUCH ULTRA TEST) test strip Use as instructed, check sugars 4-8 times a day 05/29/12  Carlus Pavlov, MD    Allergies:   Allergies  Allergen Reactions  . Morphine And Related Nausea And Vomiting and Other (See Comments)    "makes me delerirous"  . Tramadol Nausea And Vomiting  . Vicodin (Hydrocodone-Acetaminophen) Rash    Social History:  reports that she has never smoked. She has never used smokeless tobacco. She reports that she does not drink alcohol or use illicit drugs.  Family History: Family History  Problem Relation Age of Onset  . Hypertension Mother   . Kidney disease Mother   . Diabetes Mother   . Hypertension Father   . Kidney disease Father   . Diabetes Father     Physical Exam: Blood pressure 141/67, pulse 88, temperature 97.4 F (36.3 C), temperature source Oral, resp. rate 15, last menstrual period 05/14/2012, SpO2 96.00%. General: Alert, awake, oriented x3, in no acute distress. HEENT: normocephalic, atraumatic, anicteric sclera, pink  conjunctiva, pupils equal and reactive to light and accomodation, oropharynx clear Neck: supple, no masses or lymphadenopathy, no goiter, no bruits  Heart: Regular rate and rhythm, without murmurs, rubs or gallops. Lungs: Clear to auscultation bilaterally, no wheezing, rales or rhonchi. Abdomen: Morbidly obese Soft, nontender, nondistended, positive bowel sounds, no masses. Extremities: No clubbing, cyanosis or edema with positive pedal pulses. Neuro: Grossly intact, no focal neurological deficits, strength 5/5 upper and lower extremities bilaterally Psych: alert and oriented x 3, normal mood and affect Skin: no rashes or lesions, warm and dry   LABS on Admission:  Basic Metabolic Panel:  Lab 06/09/12 4540 06/09/12 1746 06/09/12 1350  NA 132* -- 125*  K 4.8 5.8* --  CL 94* -- 88*  CO2 18* -- 20  GLUCOSE 181* -- 854*  BUN 64* -- 61*  CREATININE 8.70* -- 8.14*  CALCIUM 9.2 -- 9.2  MG -- -- --  PHOS -- -- --   Liver Function Tests:  Lab 06/09/12 1350  AST 217*  ALT 89*  ALKPHOS 146*  BILITOT 0.3  PROT 7.1  ALBUMIN 3.7   No results found for this basename: LIPASE:2,AMYLASE:2 in the last 168 hours No results found for this basename: AMMONIA:2 in the last 168 hours CBC:  Lab 06/09/12 1350  WBC 9.2  NEUTROABS --  HGB 11.4*  HCT 36.6  MCV 102.2*  PLT 191   Cardiac Enzymes: No results found for this basename: CKTOTAL:2,CKMB:2,CKMBINDEX:2,TROPONINI:2 in the last 168 hours BNP: No components found with this basename: POCBNP:2 CBG:  Lab 06/09/12 2044 06/09/12 1940  GLUCAP 129* 288*     Radiological Exams on Admission: Dg Chest 2 View  06/09/2012  *RADIOLOGY REPORT*  Clinical Data: Cough, shortness of breath, history hypertension, diabetes, end-stage renal disease, asthma  CHEST - 2 VIEW  Comparison: 12/08/2011  Findings: Left jugular dual-lumen central venous catheter with distal tip projecting over cavoatrial junction. Enlargement of cardiac silhouette with pulmonary  vascular congestion. Question minimal perihilar edema particularly on the right. No gross pleural effusion or pneumothorax. Bones diffusely demineralized.  IMPRESSION: Enlargement of cardiac silhouette with pulmonary vascular congestion and question minimal pulmonary edema/failure.   Original Report Authenticated By: Ulyses Southward, M.D.     Assessment/Plan Principal Problem:  *Hyperosmolar hyperglycemia:  - Patient was started on insulin drip in the ED, currently her blood sugars has significant improved, at the time of my encounter, patient's blood sugar was 181 and the insulin drip was discontinued by EDP. I will start Lantus immediately with sliding scale insulin and diet. - Obtain hemoglobin A1c, if blood sugars  start creeping up again will place on gluco stabilizer.    Active Problems:  Hypertension: Continue medications    ESRD (end stage renal disease): On hemodialysis, currently stable, no acute shortness of breath or pulmonary edema -Patient is due for hemodialysis tomorrow she is on scheduled TTS , renal will be consulted in a.m.     DM (diabetes mellitus), type 1 with renal complications: - Patient does have an endocrinologist outpatient, continue Lantus and sliding scale insulin, follow HbA1c    GERD (gastroesophageal reflux disease): - Placed on IV PPI    Obesity: Patient was counseled on weight and diet control    Immunosuppression with history of renal transplant : - Continue CellCept, Prograf, prednisone    Nausea & vomiting: Unclear etiology,patient does not have any abdominal pain  - obtain CT scan of abdomen and pelvis, continue Zofran IV when necessary   Acute bronchitis: Continue albuterol nebs breathing treatments, patient is already on prednisone  DVT prophylaxis:  LOVENOX  CODE STATUS:  full code   Further plan will depend as patient's clinical course evolves and further radiologic and laboratory data become available.   Time Spent on Admission:  1  hour  Rogers Ditter M.D. [facility] 06/09/2012, 9:35 PM Pager: [phone]  If 7PM-7AM, please contact night-coverage [email or web address] Password [email or web address]  Addendum  - CT abdomen and pelvis showed pancolitis: - Started patient on ciprofloxacin and Flagyl, follow stool cultures, C. Difficile PCR - renal consult called for HD today per her schedule   [name] M.D. [facility] 06/10/2012, 6:55 AM  Pager: [phone]

## 2012-06-10 ENCOUNTER — Inpatient Hospital Stay (HOSPITAL_COMMUNITY): Payer: Medicare Other

## 2012-06-10 ENCOUNTER — Observation Stay (HOSPITAL_COMMUNITY): Payer: Medicare Other

## 2012-06-10 DIAGNOSIS — R05 Cough: Secondary | ICD-10-CM

## 2012-06-10 DIAGNOSIS — R109 Unspecified abdominal pain: Secondary | ICD-10-CM

## 2012-06-10 DIAGNOSIS — Z992 Dependence on renal dialysis: Secondary | ICD-10-CM

## 2012-06-10 LAB — GLUCOSE, CAPILLARY
Glucose-Capillary: 185 mg/dL — ABNORMAL HIGH (ref 70–99)
Glucose-Capillary: 164 mg/dL — ABNORMAL HIGH (ref 70–99)
Glucose-Capillary: 64 mg/dL — ABNORMAL LOW (ref 70–99)
Glucose-Capillary: 146 mg/dL — ABNORMAL HIGH (ref 70–99)

## 2012-06-10 LAB — CBC
HCT: 31.2 % — ABNORMAL LOW (ref 36.0–46.0)
Hemoglobin: 10.3 g/dL — ABNORMAL LOW (ref 12.0–15.0)
WBC: 7.5 10*3/uL (ref 4.0–10.5)

## 2012-06-10 LAB — MRSA PCR SCREENING: MRSA by PCR: NEGATIVE

## 2012-06-10 LAB — HEMOGLOBIN A1C
Hgb A1c MFr Bld: 10.7 % — ABNORMAL HIGH (ref <5.7)
Mean Plasma Glucose: 260 mg/dL — ABNORMAL HIGH (ref <117)

## 2012-06-10 LAB — BASIC METABOLIC PANEL
Chloride: 91 mEq/L — ABNORMAL LOW (ref 96–112)
GFR calc Af Amer: 6 mL/min — ABNORMAL LOW (ref >90)
GFR calc non Af Amer: 5 mL/min — ABNORMAL LOW (ref >90)
Potassium: 5.1 mEq/L (ref 3.5–5.1)
Sodium: 132 mEq/L — ABNORMAL LOW (ref 135–145)

## 2012-06-10 MED ORDER — OSELTAMIVIR PHOSPHATE 75 MG PO CAPS: 75.0000 mg | ORAL_CAPSULE | Freq: Two times a day (BID) | ORAL | Status: DC

## 2012-06-10 MED ORDER — OSELTAMIVIR PHOSPHATE 6 MG/ML PO SUSR
30.0000 mg | Freq: Once | ORAL | Status: AC
  Administered 2012-06-10: 30 mg via ORAL
  Filled 2012-06-10: qty 5

## 2012-06-10 MED ORDER — HYDROXYZINE HCL 25 MG PO TABS
25.0000 mg | ORAL_TABLET | Freq: Three times a day (TID) | ORAL | Status: DC | PRN
Start: 2012-06-10 — End: 2012-06-12
  Administered 2012-06-10 – 2012-06-11 (×3): 25 mg via ORAL
  Filled 2012-06-10 (×5): qty 1

## 2012-06-10 MED ORDER — METRONIDAZOLE IN NACL 5-0.79 MG/ML-% IV SOLN
500.0000 mg | Freq: Three times a day (TID) | INTRAVENOUS | Status: DC
  Administered 2012-06-10 – 2012-06-11 (×3): 500 mg via INTRAVENOUS
  Filled 2012-06-10 (×6): qty 100

## 2012-06-10 MED ORDER — INFLUENZA VIRUS VACC SPLIT PF IM SUSP
0.5000 mL | INTRAMUSCULAR | Status: AC
Start: 2012-06-11 — End: 2012-06-11
  Administered 2012-06-11: 0.5 mL via INTRAMUSCULAR
  Filled 2012-06-10: qty 0.5

## 2012-06-10 MED ORDER — DIPHENHYDRAMINE HCL 25 MG PO CAPS
25.0000 mg | ORAL_CAPSULE | Freq: Three times a day (TID) | ORAL | Status: DC | PRN
  Administered 2012-06-10 (×2): 25 mg via ORAL
  Filled 2012-06-10 (×2): qty 1

## 2012-06-10 MED ORDER — MYCOPHENOLATE MOFETIL 250 MG PO CAPS
250.0000 mg | ORAL_CAPSULE | Freq: Two times a day (BID) | ORAL | Status: DC
  Administered 2012-06-11 – 2012-06-12 (×3): 250 mg via ORAL
  Filled 2012-06-10 (×4): qty 1

## 2012-06-10 MED ORDER — RENA-VITE PO TABS
1.0000 | ORAL_TABLET | Freq: Every day | ORAL | Status: DC
  Administered 2012-06-10 – 2012-06-11 (×2): 1 via ORAL
  Filled 2012-06-10 (×3): qty 1

## 2012-06-10 MED ORDER — CIPROFLOXACIN IN D5W 400 MG/200ML IV SOLN
400.0000 mg | INTRAVENOUS | Status: DC
  Administered 2012-06-10 – 2012-06-11 (×2): 400 mg via INTRAVENOUS
  Filled 2012-06-10 (×2): qty 200

## 2012-06-10 MED ORDER — TACROLIMUS 1 MG PO CAPS
1.0000 mg | ORAL_CAPSULE | Freq: Two times a day (BID) | ORAL | Status: DC
  Administered 2012-06-11 – 2012-06-12 (×3): 1 mg via ORAL
  Filled 2012-06-10 (×4): qty 1

## 2012-06-10 MED ORDER — PNEUMOCOCCAL VAC POLYVALENT 25 MCG/0.5ML IJ INJ
0.5000 mL | INJECTION | INTRAMUSCULAR | Status: DC
  Filled 2012-06-10: qty 0.5

## 2012-06-10 MED ORDER — DOXERCALCIFEROL 4 MCG/2ML IV SOLN
INTRAVENOUS | Status: AC
  Administered 2012-06-10: 4 ug via INTRAVENOUS
  Filled 2012-06-10: qty 2

## 2012-06-10 MED ORDER — DARBEPOETIN ALFA-POLYSORBATE 25 MCG/0.42ML IJ SOLN
INTRAMUSCULAR | Status: AC
  Filled 2012-06-10: qty 0.42

## 2012-06-10 MED ORDER — IPRATROPIUM BROMIDE 0.02 % IN SOLN: 0.5000 mg | RESPIRATORY_TRACT | Status: DC | PRN

## 2012-06-10 MED ORDER — OSELTAMIVIR PHOSPHATE 6 MG/ML PO SUSR: 30.0000 mg | ORAL | Status: DC

## 2012-06-10 MED ORDER — GUAIFENESIN 200 MG PO TABS
200.0000 mg | ORAL_TABLET | ORAL | Status: DC | PRN
  Filled 2012-06-10: qty 1

## 2012-06-10 MED ORDER — ZOLPIDEM TARTRATE 5 MG PO TABS
5.0000 mg | ORAL_TABLET | Freq: Every evening | ORAL | Status: DC | PRN
  Administered 2012-06-10: 5 mg via ORAL
  Filled 2012-06-10: qty 1

## 2012-06-10 MED ORDER — DOXERCALCIFEROL 4 MCG/2ML IV SOLN
4.0000 ug | INTRAVENOUS | Status: DC
  Administered 2012-06-10: 4 ug via INTRAVENOUS
  Filled 2012-06-10 (×2): qty 2

## 2012-06-10 MED ORDER — DARBEPOETIN ALFA-POLYSORBATE 25 MCG/0.42ML IJ SOLN
25.0000 ug | INTRAMUSCULAR | Status: DC
  Administered 2012-06-10: 25 ug via INTRAVENOUS

## 2012-06-10 NOTE — Progress Notes (Signed)
[facility] PROGRESS NOTE  EPIFANIA LITTRELL [name]:[id number] DOB: November 11, 1970 DOA: 06/09/2012 PCP: Oliver Barre, MD  Assessment/Plan: 1-Hyperosmolar hyperglycemia// DM (diabetes mellitus), type 1 with renal complications: Bicarb increase to 22. Patient initially treated with insulin Gtt.  Continue with Lantus 6 units BID. SSI.  2-Hypertension: Continue medications,  3-ESRD: renal consulted.  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   6-Acute bronchitis: Continue albuterol nebs breathing treatments, patient is already on prednisone. Patient relates sore throat, fever, cough for last 3 days. Will order influenza,. Start Tamiflu empirically. Droplet precaution.   7-Colitis: Patient presents with nausea, vomiting, abdominal pain. CT abdomen show pan-colitis. C. Diff order although no diarrhea. Continue with Ciprofloxacin and Flagyl. If no improvement will need GI evaluation, patient on multiple anti-suppresive therapy.    Code Status: Full. Family Communication: I spoke directly with patient.  Disposition Plan: Home when stable.    Consultants:  Nephrology.  Procedures:  Dialysis.  Antibiotics:  Ciprofloxacin 01-14  Flagyl 01-14  HPI/Subjective: Feeling better, abdominal pain improved. No diarrhea.  Objective: Filed Vitals:   06/09/12 1915 06/09/12 2329 06/10/12 0415 06/10/12 0820  BP: 141/67 131/71 145/76 141/80  Pulse: 88 79 80 81  Temp:  97 F (36.1 C) 97.1 F (36.2 C) 98.2 F (36.8 C)  TempSrc:  Axillary Oral Oral  Resp: 15 18 18 18   Height:  5\' 1"  (1.549 m)    Weight:  75.5 kg (166 lb 7.2 oz)    SpO2: 96% 100% 98% 98%    Intake/Output Summary (Last 24 hours) at 06/10/12 1118 Last data filed at 06/10/12 1042  Gross per 24 hour  Intake   1038 ml  Output      0 ml  Net   1038 ml   Filed Weights   06/09/12 2329  Weight: 75.5  kg (166 lb 7.2 oz)    Exam:   General:  No distress.  Cardiovascular: S 1, S 2 RRR  Respiratory: CTA  Abdomen: BS present, soft, NT  Data Reviewed: Basic Metabolic Panel:  Lab 06/10/12 8119 06/09/12 2015 06/09/12 1746 06/09/12 1350  NA 132* 132* -- 125*  K 5.1 4.8 5.8* 6.3*  CL 91* 94* -- 88*  CO2 22 18* -- 20  GLUCOSE 195* 181* -- 854*  BUN 66* 64* -- 61*  CREATININE 9.26* 8.70* -- 8.14*  CALCIUM 10.2 9.2 -- 9.2  MG -- -- -- --  PHOS -- -- -- --   Liver Function Tests:  Lab 06/09/12 1350  AST 217*  ALT 89*  ALKPHOS 146*  BILITOT 0.3  PROT 7.1  ALBUMIN 3.7   No results found for this basename: LIPASE:5,AMYLASE:5 in the last 168 hours No results found for this basename: AMMONIA:5 in the last 168 hours CBC:  Lab 06/10/12 0610 06/09/12 1350  WBC 7.5 9.2  NEUTROABS -- --  HGB 10.3* 11.4*  HCT 31.2* 36.6  MCV 99.0 102.2*  PLT 180 191   Cardiac Enzymes: No results found for this basename: CKTOTAL:5,CKMB:5,CKMBINDEX:5,TROPONINI:5 in the last 168 hours BNP (last 3 results) No results found for this basename: PROBNP:3 in the last 8760 hours CBG:  Lab 06/10/12 0801 06/09/12 2237 06/09/12 2044 06/09/12 1940 06/09/12 1837  GLUCAP 185* 112* 129* 288* 508*    Recent Results (from the past 240 hour(s))  MRSA PCR SCREENING     Status: Normal   Collection Time   06/09/12 10:45  PM      Component Value Range Status Comment   MRSA by PCR NEGATIVE  NEGATIVE Final      Studies: Ct Abdomen Pelvis Wo Contrast  06/10/2012  *RADIOLOGY REPORT*  Clinical Data: Nausea and vomiting for 2 days.  Elevated blood sugars.  Fevers.  CT ABDOMEN AND PELVIS WITHOUT CONTRAST  Technique:  Multidetector CT imaging of the abdomen and pelvis was performed following the standard protocol without intravenous contrast.  Comparison: 09/14/2010.  Findings: The small right pleural effusion.  Diffuse airspace infiltration noted in the lung bases.  Changes could represent edema or pneumonia.  Mild  cardiac enlargement.  There appears to be a small esophageal hiatal hernia with opaque densities in the distal esophagus likely representing opaque medication.  Opaque medication also present in the stomach.  Surgical absence of the gallbladder.  The unenhanced appearance of the liver, spleen, pancreas, adrenal glands, and retroperitoneal lymph nodes is unremarkable. The kidneys are atrophic and appear to be a cyst filled.  No hydronephrosis.  Vascular calcifications. Calcification of the aorta without aneurysm.  The stomach, small bowel, and colon are not abnormally distended.  There are postoperative changes in the right lower quadrant correlating with history of pancreatic transplantation.  There is mild diffuse thickening of the wall of the colon with hazy pericolonic infiltration and infiltration in the omentum.  Changes suggest diffuse colitis. No free air or free fluid in the abdomen.  Pelvis:  There is a small amount of free fluid in the pelvis.  The uterus and adnexal structures are not significantly enlarged. Rounded structure in the left pelvis measuring 2.5 x 3.3 cm likely represents transplant kidney and is decreasing in size since the previous study. No loculated pelvic fluid collections.  The appendix is normal.  No diverticulitis.  IMPRESSION: Diffuse colonic wall thickening and pericolonic infiltration suggesting pan colitis.  Small right pleural effusion.  Airspace infiltration in both lung bases.   Original Report Authenticated By: Burman Nieves, M.D.    Dg Chest 2 View  06/09/2012  *RADIOLOGY REPORT*  Clinical Data: Cough, shortness of breath, history hypertension, diabetes, end-stage renal disease, asthma  CHEST - 2 VIEW  Comparison: 12/08/2011  Findings: Left jugular dual-lumen central venous catheter with distal tip projecting over cavoatrial junction. Enlargement of cardiac silhouette with pulmonary vascular congestion. Question minimal perihilar edema particularly on the right. No gross  pleural effusion or pneumothorax. Bones diffusely demineralized.  IMPRESSION: Enlargement of cardiac silhouette with pulmonary vascular congestion and question minimal pulmonary edema/failure.   Original Report Authenticated By: Ulyses Southward, M.D.     Scheduled Meds:   . irbesartan  150 mg Oral Daily   And  . amLODipine  10 mg Oral Daily  . aspirin EC  81 mg Oral Daily  . calcium acetate  2,001-2,668 mg Oral TID WC  . ciprofloxacin  400 mg Intravenous Q24H  . clonazePAM  2 mg Oral QHS  . enoxaparin (LOVENOX) injection  30 mg Subcutaneous Q24H  . insulin aspart  0-15 Units Subcutaneous TID WC  . insulin aspart  0-5 Units Subcutaneous QHS  . insulin glargine  6 Units Subcutaneous BID  . metoprolol succinate  100 mg Oral 4 times weekly  . metronidazole  500 mg Intravenous Q8H  . mycophenolate  250 mg Oral Daily  . pantoprazole (PROTONIX) IV  40 mg Intravenous Q12H  . predniSONE  5 mg Oral Daily  . sodium chloride  3 mL Intravenous Q12H  . tacrolimus  1 mg Oral Daily  .  zolpidem  10 mg Oral QHS   Continuous Infusions:   Principal Problem:  *Hyperosmolar hyperglycemia Active Problems:  Hypertension  ESRD (end stage renal disease)  DM (diabetes mellitus), type 1 with renal complications  GERD (gastroesophageal reflux disease)  Obesity  Immunosuppression  Nausea & vomiting    Time spent: 25 minutes.    Tzion Wedel  [facility] Pager [phone]. If 8PM-8AM, please contact night-coverage at [email or web address], password [facility] 06/10/2012, 11:18 AM  LOS: 1 day

## 2012-06-10 NOTE — Procedures (Signed)
I was present at this dialysis session. I have reviewed the session itself and made appropriate changes.   Vinson Moselle, MD [employer] 06/10/2012, 4:41 PM

## 2012-06-10 NOTE — Consult Note (Signed)
[facility] Renal Consultation Note    Indication for Consultation:  Management of ESRD/hemodialysis; anemia, hypertension/volume and secondary hyperparathyroidism  HPI: [name] is a 42 y.o. female ESRD patient (TTS HD) with history of insulin-dependent diabetes mellitus, s/p failed kidney-pancreas transplant (2008), now on immunosuppressants and prednisone. She presented to ED with uncontrolled blood sugars that she states have been in the 800's over the past week despite Lantus and sliding scale insulin. She also reports N/V which began over the last two days, cough and intense pruritus.  Per [facility] records, she has been compliant with her HD treatments. However, monthly lab values indicate blood glucose levels in the 800's as far back as 05/19/12. She was placed on a blood glucose stabilizer and her levels are now in the 180's.  At the time of this encounter, she is resting in bed on HD. In addition to the primary complaints above, she also endorses migraine headache and wheezing refractory to albuterol.  She denies dizziness, SOB, abdominal pain, nausea, vomiting, diarrhea, hematochezia or melena.  Past Medical History  Diagnosis Date  . Hyperlipidemia   . Alopecia   . Obesity   . Iron deficiency   . 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 RLS (restless legs syndrome)   . Respiratory failure   . Injury of conjunctiva and corneal abrasion of right eye without foreign body   . Cellulitis   . Dialysis patient     Tues; Thurs; Sat; [place]  . Angina   . 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 Anemia   . Pneumonia 2012    "double"  . Blood transfusion 2004    "when I had my transplant"  . Migraine   . 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 Hyperparathyroidism, secondary   . Diabetic retinopathy(362.0)   . Hypertension    takes Metoprolol and Exforge daily  . Depression     takes Klonopin nightly  . Diabetes mellitus type 1     Pt states diagnosed at age 89 with prior episodes of DKA. S/p pancreatic transplant   . GERD (gastroesophageal reflux disease)     takes Protonix daily  . PONV (postoperative nausea and vomiting)   . Bronchitis   . Migraine   . Asthma   . Shortness of breath   . Emphysema of lung    Past Surgical History  Procedure Date  . 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 Thyroglobulin     x 7  . Av fistula placement     right upper arm  . Cholecystectomy 1995  .  hd graft placement/removal     "had 2 in my left upper arm"  . Eye surgery   . Retinopathy surgery   . Tooth extraction 10/10/11    X's two  . Insertion of dialysis catheter 12/08/2011    Procedure: INSERTION OF DIALYSIS CATHETER;  Surgeon: Chuck Hint, MD;  Location: [facility] OR;  Service: Vascular;  Laterality: Left;  . Av fistula placement 01/22/2012    Procedure: INSERTION OF ARTERIOVENOUS (AV) GORE-TEX GRAFT ARM;  Surgeon: Chuck Hint, MD;  Location: [facility] OR;  Service: Vascular;  Laterality: Left;  Ultrasound guided.  . Av fistula placement 03/14/2012    Procedure: INSERTION OF ARTERIOVENOUS (AV) GORE-TEX GRAFT THIGH;  Surgeon: Chuck Hint,  MD;  Location: [facility] OR;  Service: Vascular;  Laterality: Right;   Family History  Problem Relation Age of Onset  . Hypertension Mother   . Kidney disease Mother   . Diabetes Mother   . Hypertension Father   . Kidney disease Father   . Diabetes Father    Social History:  reports that she has never smoked. She has never used smokeless tobacco. She reports that she does not drink alcohol or use illicit drugs. Allergies  Allergen Reactions  . Morphine And Related Nausea And Vomiting and Other (See Comments)    "makes me delerirous"  . Tramadol Nausea And Vomiting  . Vicodin (Hydrocodone-Acetaminophen) Rash   Prior to  Admission medications   Medication Sig Start Date End Date Taking? Authorizing Provider  albuterol (PROVENTIL HFA;VENTOLIN HFA) 108 (90 BASE) MCG/ACT inhaler Inhale 2 puffs into the lungs every 4 (four) hours as needed. For wheezing.  04/23/11  Yes Nyoka Cowden, MD  amLODipine-valsartan (EXFORGE) 10-160 MG per tablet Take 1 tablet by mouth daily. 06/19/11 06/18/12 Yes Nyoka Cowden, MD  aspirin EC 81 MG tablet Take 81 mg by mouth daily.   Yes Historical Provider, MD  calcium acetate (PHOSLO) 667 MG capsule Take 2,001-2,668 mg by mouth 3 (three) times daily with meals. Dosage depends on meal consumed 03/12/11  Yes Historical Provider, MD  clonazePAM (KLONOPIN) 0.5 MG tablet Take 2 mg by mouth at bedtime.    Yes Historical Provider, MD  insulin aspart (NOVOLOG) 100 UNIT/ML injection Inject 15-20 Units into the skin 3 (three) times daily before meals. Inject approximately 15-20 units under skin as advised 04/21/12  Yes Carlus Pavlov, MD  insulin glargine (LANTUS) 100 UNIT/ML injection Inject 6 Units into the skin 2 (two) times daily.    Yes Historical Provider, MD  metoprolol succinate (TOPROL-XL) 100 MG 24 hr tablet Take 100 mg by mouth 4 (four) times a week. Do not take the night before dialysis. Dialysis on Tues, Thurs & Sat.   Yes Historical Provider, MD  mycophenolate (CELLCEPT) 250 MG capsule Take 250 mg by mouth daily.    Yes Historical Provider, MD  pantoprazole (PROTONIX) 40 MG tablet Take 40 mg by mouth daily.   Yes Historical Provider, MD  predniSONE (DELTASONE) 5 MG tablet Take 1 tablet by mouth daily. 01/21/11  Yes Historical Provider, MD  tacrolimus (PROGRAF) 1 MG capsule Take 1 mg by mouth daily.    Yes Historical Provider, MD  zolpidem (AMBIEN) 10 MG tablet Take 10 mg by mouth at bedtime. For insomnia.   Yes Historical Provider, MD  Blood Glucose Monitoring Suppl (ACCU-CHEK ADVANTAGE DIABETES) kit Use as instructed 05/29/12   Carlus Pavlov, MD  Blood Glucose Monitoring Suppl (ONE  TOUCH ULTRA SYSTEM KIT) W/DEVICE KIT Use as advised 05/29/12   Carlus Pavlov, MD  glucagon (GLUCAGON EMERGENCY) 1 MG injection Inject 1 mg into the vein once as needed. 04/21/12   Carlus Pavlov, MD  glucose blood (ACCU-CHEK ADVANTAGE TEST) test strip Use as instructed, check sugars 4-8 times a day 05/29/12   Carlus Pavlov, MD  glucose blood (ONE TOUCH ULTRA TEST) test strip Use as instructed, check sugars 4-8 times a day 05/29/12   Carlus Pavlov, MD   Current Facility-Administered Medications  Medication Dose Route Frequency Provider Last Rate Last Dose  . acetaminophen (TYLENOL) tablet 650 mg  650 mg Oral Q6H PRN Ripudeep K Rai, MD       Or  . acetaminophen (TYLENOL) suppository 650 mg  650 mg  Rectal Q6H PRN Ripudeep K Rai, MD      . albuterol (PROVENTIL) (5 MG/ML) 0.5% nebulizer solution 2.5 mg  2.5 mg Nebulization Q4H PRN Ripudeep K Rai, MD      . irbesartan (AVAPRO) tablet 150 mg  150 mg Oral Daily Ripudeep Jenna Luo, MD       And  . amLODipine (NORVASC) tablet 10 mg  10 mg Oral Daily Ripudeep K Rai, MD      . aspirin EC tablet 81 mg  81 mg Oral Daily Ripudeep Jenna Luo, MD   81 mg at 06/10/12 0034  . calcium acetate (PHOSLO) capsule 2,001-2,668 mg  2,001-2,668 mg Oral TID WC Ripudeep Jenna Luo, MD   2,001 mg at 06/10/12 1346  . ciprofloxacin (CIPRO) IVPB 400 mg  400 mg Intravenous Q24H Ripudeep Jenna Luo, MD   400 mg at 06/10/12 0806  . clonazePAM (KLONOPIN) tablet 2 mg  2 mg Oral QHS Ripudeep K Rai, MD   2 mg at 06/10/12 0034  . darbepoetin (ARANESP) injection 25 mcg  25 mcg Intravenous Q Tue-HD Kerin Salen, PA-C   25 mcg at 06/10/12 1531  . diphenhydrAMINE (BENADRYL) capsule 25 mg  25 mg Oral Q8H PRN Leda Gauze, NP   25 mg at 06/10/12 1118  . doxercalciferol (HECTOROL) injection 4 mcg  4 mcg Intravenous Q T,Th,Sa-HD Kerin Salen, PA-C   4 mcg at 06/10/12 1532  . enoxaparin (LOVENOX) injection 30 mg  30 mg Subcutaneous Q24H Ripudeep K Rai, MD      . guaiFENesin tablet 200 mg  200 mg  Oral Q4H PRN Belkys A Regalado, MD      . HYDROmorphone (DILAUDID) injection 1 mg  1 mg Intravenous Q4H PRN Ripudeep K Rai, MD   1 mg at 06/10/12 1124  . influenza  inactive virus vaccine (FLUZONE/FLUARIX) injection 0.5 mL  0.5 mL Intramuscular Tomorrow-1000 Belkys A Regalado, MD      . insulin aspart (novoLOG) injection 0-15 Units  0-15 Units Subcutaneous TID WC Ripudeep K Rai, MD      . insulin aspart (novoLOG) injection 0-5 Units  0-5 Units Subcutaneous QHS Ripudeep K Rai, MD      . insulin glargine (LANTUS) injection 6 Units  6 Units Subcutaneous BID Ripudeep Jenna Luo, MD   6 Units at 06/10/12 1027  . ipratropium (ATROVENT) nebulizer solution 0.5 mg  0.5 mg Nebulization Q4H PRN Ripudeep K Rai, MD      . metoprolol succinate (TOPROL-XL) 24 hr tablet 100 mg  100 mg Oral 4 times weekly Ripudeep K Rai, MD      . metroNIDAZOLE (FLAGYL) IVPB 500 mg  500 mg Intravenous Q8H Ripudeep K Rai, MD   500 mg at 06/10/12 1228  . multivitamin (RENA-VIT) tablet 1 tablet  1 tablet Oral QHS Kerin Salen, PA-C      . mycophenolate (CELLCEPT) capsule 250 mg  250 mg Oral Daily Ripudeep Jenna Luo, MD   250 mg at 06/10/12 0034  . ondansetron (ZOFRAN) tablet 4 mg  4 mg Oral Q6H PRN Ripudeep K Rai, MD       Or  . ondansetron (ZOFRAN) injection 4 mg  4 mg Intravenous Q6H PRN Ripudeep Jenna Luo, MD   4 mg at 06/10/12 0634  . oseltamivir (TAMIFLU) 6 MG/ML suspension 30 mg  30 mg Oral Once Belkys A Regalado, MD      . oseltamivir (TAMIFLU) 6 MG/ML suspension 30 mg  30 mg Oral Q T,Th,Sa-HD Belkys  A Regalado, MD      . pantoprazole (PROTONIX) injection 40 mg  40 mg Intravenous Q12H Ripudeep K Rai, MD   40 mg at 06/10/12 1030  . pneumococcal 23 valent vaccine (PNU-IMMUNE) injection 0.5 mL  0.5 mL Intramuscular Tomorrow-1000 Belkys A Regalado, MD      . predniSONE (DELTASONE) tablet 5 mg  5 mg Oral Daily Ripudeep K Rai, MD   5 mg at 06/10/12 0033  . sodium chloride 0.9 % injection 3 mL  3 mL Intravenous Q12H Ripudeep K Rai, MD   3 mL at  06/10/12 1038  . tacrolimus (PROGRAF) capsule 1 mg  1 mg Oral Daily Ripudeep K Rai, MD   1 mg at 06/10/12 0033  . zolpidem (AMBIEN) tablet 5 mg  5 mg Oral QHS PRN Alba Cory, MD       Labs: Basic Metabolic Panel:  Lab 06/10/12 1478 06/09/12 2015 06/09/12 1746 06/09/12 1350  NA 132* 132* -- 125*  K 5.1 4.8 5.8* --  CL 91* 94* -- 88*  CO2 22 18* -- 20  GLUCOSE 195* 181* -- 854*  BUN 66* 64* -- 61*  CREATININE 9.26* 8.70* -- 8.14*  CALCIUM 10.2 9.2 -- 9.2  ALB -- -- -- --  PHOS -- -- -- --   Liver Function Tests:  Lab 06/09/12 1350  AST 217*  ALT 89*  ALKPHOS 146*  BILITOT 0.3  PROT 7.1  ALBUMIN 3.7    CBC:  Lab 06/10/12 0610 06/09/12 1350  WBC 7.5 9.2  NEUTROABS -- --  HGB 10.3* 11.4*  HCT 31.2* 36.6  MCV 99.0 102.2*  PLT 180 191   Cardiac Enzymes: No results found for this basename: CKTOTAL:5,CKMB:5,CKMBINDEX:5,TROPONINI:5 in the last 168 hours CBG:  Lab 06/10/12 1147 06/10/12 0801 06/09/12 2237 06/09/12 2044 06/09/12 1940  GLUCAP 164* 185* 112* 129* 288*   Studies/Results: Ct Abdomen Pelvis Wo Contrast  06/10/2012  *RADIOLOGY REPORT*  Clinical Data: Nausea and vomiting for 2 days.  Elevated blood sugars.  Fevers.  CT ABDOMEN AND PELVIS WITHOUT CONTRAST  Technique:  Multidetector CT imaging of the abdomen and pelvis was performed following the standard protocol without intravenous contrast.  Comparison: 09/14/2010.  Findings: The small right pleural effusion.  Diffuse airspace infiltration noted in the lung bases.  Changes could represent edema or pneumonia.  Mild cardiac enlargement.  There appears to be a small esophageal hiatal hernia with opaque densities in the distal esophagus likely representing opaque medication.  Opaque medication also present in the stomach.  Surgical absence of the gallbladder.  The unenhanced appearance of the liver, spleen, pancreas, adrenal glands, and retroperitoneal lymph nodes is unremarkable. The kidneys are atrophic and appear  to be a cyst filled.  No hydronephrosis.  Vascular calcifications. Calcification of the aorta without aneurysm.  The stomach, small bowel, and colon are not abnormally distended.  There are postoperative changes in the right lower quadrant correlating with history of pancreatic transplantation.  There is mild diffuse thickening of the wall of the colon with hazy pericolonic infiltration and infiltration in the omentum.  Changes suggest diffuse colitis. No free air or free fluid in the abdomen.  Pelvis:  There is a small amount of free fluid in the pelvis.  The uterus and adnexal structures are not significantly enlarged. Rounded structure in the left pelvis measuring 2.5 x 3.3 cm likely represents transplant kidney and is decreasing in size since the previous study. No loculated pelvic fluid collections.  The appendix is normal.  No diverticulitis.  IMPRESSION: Diffuse colonic wall thickening and pericolonic infiltration suggesting pan colitis.  Small right pleural effusion.  Airspace infiltration in both lung bases.   Original Report Authenticated By: Burman Nieves, M.D.    Dg Chest 2 View  06/09/2012  *RADIOLOGY REPORT*  Clinical Data: Cough, shortness of breath, history hypertension, diabetes, end-stage renal disease, asthma  CHEST - 2 VIEW  Comparison: 12/08/2011  Findings: Left jugular dual-lumen central venous catheter with distal tip projecting over cavoatrial junction. Enlargement of cardiac silhouette with pulmonary vascular congestion. Question minimal perihilar edema particularly on the right. No gross pleural effusion or pneumothorax. Bones diffusely demineralized.  IMPRESSION: Enlargement of cardiac silhouette with pulmonary vascular congestion and question minimal pulmonary edema/failure.   Original Report Authenticated By: Ulyses Southward, M.D.     ROS: Elevated blood sugars, cough, pruritus, migraine HA.  10 pt ROS asked and answered.  All other symptoms negative except as described in the above  HPI.   Physical Exam: Filed Vitals:   06/10/12 1152 06/10/12 1445 06/10/12 1500 06/10/12 1530  BP: 119/80 137/77 137/77 114/73  Pulse: 83 85 85 81  Temp: 97.8 F (36.6 C) 98.4 F (36.9 C)    TempSrc: Oral Oral    Resp: 20 14 21 12   Height:      Weight:  76.8 kg (169 lb 5 oz)    SpO2: 96% 96%       General: Well developed, well nourished, intense pruritus Head: Normocephalic, atraumatic, sclera non-icteric, mucus membranes are moist Neck: Supple. JVD not elevated. Lungs: Faint wheezes + cough. No rales or rhonchi. Breathing is unlabored. Heart: RRR with S1 S2. No murmurs, rubs, or gallops appreciated. Abdomen: Soft, non-tender, non-distended with normoactive bowel sounds. No rebound/guarding. No obvious abdominal masses. M-S:  Strength and tone appear normal for age. Lower extremities: Trace LE edema. No ischemic changes or open wounds  Neuro: Alert and oriented X 3. Moves all extremities spontaneously. Psych:  Responds to questions appropriately with a normal affect. Dialysis Access: Left I-J catheter in use. Rt thigh AVG maturing/ + bruit (placed 03/14/2012 by Dr. Edilia Bo)   Dialysis Orders: Center: [facility]  on TTS . EDW 72 kg HD Bath 2K+/ 2.5 CA++  Time 3:45 Heparin 5000. Access Lt I-J/ Rt Thigh AVG maturing  BFR 400 DFR A1.5    Hectorol 4 mcg IV/HD Epogen 1600   Units IV/HD  Venofer  none   Assessment/Plan: 1.  Hyperosmolar Hyperglycemia - Admitted with BG of 854 + N/V. Started on glucose stabilizer and sugars are now in the 180's - per admit. Per [facility] records, sugars have been in the 800's as distantly as 05/19/12. She is s/p failed kidney-pancreas transplant in 2008 and on chronic prednisone and immunosuppressants. N/V resolved with IV Zofran.  CT abdomen and pelvis show pancolitis - C. Diff PCR and stool cultures pending.  Started on Cipro and Flagyl empirically by primary. Pt's pancreas Tx failed in 2011 according to patient, kidney Tx failed in 2008.  She states that she has  been maintained on low doses of Cellcept, Prograf and pred by her transplant MD at [facility] (Dr. Resa Miner) to avoid rejection of current organs while she is on waiting list for another KP transplant.  We have adjusted prograf and cellcept to bid as she takes at home.  2. Acute bronchitis - Per primary. Albuterol nebs. Pt already on prednisone. 3. Pruritus - Chronic.  Phosphorus is controlled. Hydroxyzine 25 mg Q8 PRN. 4.  ESRD -  TTS at  [facility]. K+ 5.1. HD ordered for today. 5.  Hypertension/volume  - BP 110s to 130's systolic. CXR 1/13 with pulmonary vascular congestion and questionable pulmonary edema.  About 4.5 L above EDW.   6.  Anemia  - Hgb 10.3 on OP Epogen.  Tsat 44% in December. Ferritin 754 in Nov.  Aranesp 25  Q Tues HD and hold IV iron for now. 7.  Metabolic bone disease -  Ca++ 84.6 (10.4 corrected.) Last PTH 305.4 in Dec. P 3.4.  On Hectorol 4 mcg - may consider decreasing.  8.  Nutrition - Albumin 3.7.  High protein renal diet and multivitamin. 9. DM - on insulin per primary 10. HX of renal transplant - Continue CellCept, Prograf and prednisone per primary  [name] Kelp, PA-C [facility] [phone] pager 06/10/2012, 3:54 PM   Patient seen and examined and reviewed with [name], P.A., with additions as indicated. Vinson Moselle  MD [facility] [phone] pgr    [phone] cell 06/10/2012, 6:33 PM

## 2012-06-10 NOTE — Progress Notes (Signed)
Inpatient Diabetes Program Recommendations  AACE/ADA: New Consensus Statement on Inpatient Glycemic Control (2013)  Target Ranges:  Prepandial:   less than 140 mg/dL      Peak postprandial:   less than 180 mg/dL (1-2 hours)      Critically ill patients:  140 - 180 mg/dL   Reason for Visit: Admitted with hyperglycemia.  Glucose 854 mg/dl on admission.  Patient well known to the Inpatient Diabetes Program.  Have seen this patient many times in the past during previous admissions.    Noted patient saw her endocrinologist Dr. Shella Maxim on 04/21/12.  Adjustments were made to her home insulin regimen during this visit.  Per Dr. Charlean Sanfilippo note, patient spent time on her cell phone during her doctor's visit.  CBGs today:  185/ 164 mg/dl  Note: Will follow. [name] RN, MSN, CDE Diabetes Coordinator Inpatient Diabetes Program [phone]

## 2012-06-10 NOTE — Evaluation (Signed)
Occupational Therapy Evaluation Patient Details Name: [name] MRN: [id number] DOB: 11-18-1970 Today's Date: 06/10/2012 Time: 7829-5621 OT Time Calculation (min): 18 min  OT Assessment / Plan / Recommendation Clinical Impression  Pt adm with n/v and hyperglycemia. Pt generally weak and will benefit from skilled OT in the acute setting to maximize I with ADL and ADL mobility prior to d/c.     OT Assessment  Patient needs continued OT Services    Follow Up Recommendations  Home health OT;Supervision/Assistance - 24 hour    Barriers to Discharge      Equipment Recommendations  None recommended by OT    Recommendations for Other Services    Frequency  Min 2X/week    Precautions / Restrictions Precautions Precautions: Fall   Pertinent Vitals/Pain Pt reports migraines this am but did not rate. Reports she is premedicated.     ADL  Grooming: Supervision/safety Where Assessed - Grooming: Unsupported sitting Upper Body Bathing: Supervision/safety Where Assessed - Upper Body Bathing: Supported sitting Lower Body Bathing: Min guard Where Assessed - Lower Body Bathing: Supported sitting Upper Body Dressing: Set up;Supervision/safety Where Assessed - Upper Body Dressing: Unsupported sitting Lower Body Dressing: Min guard Where Assessed - Lower Body Dressing: Unsupported sit to stand Toilet Transfer: Min Pension scheme manager Method: Sit to stand;Stand pivot Acupuncturist: Bedside commode Toileting - Architect and Hygiene: Min guard Where Assessed - Engineer, mining and Hygiene: Standing;Sit to stand from 3-in-1 or toilet Equipment Used: Gait belt;Rolling walker Transfers/Ambulation Related to ADLs: shuffling gait and slightly unsteady without AD. Much steadier with RW ADL Comments: childlike at times. Also, has a tick (hitting head)    OT Diagnosis: Generalized weakness;Acute pain  OT Problem List: Decreased strength;Decreased  activity tolerance;Pain;Decreased knowledge of use of DME or AE;Decreased knowledge of precautions OT Treatment Interventions: Self-care/ADL training;DME and/or AE instruction;Therapeutic activities;Patient/family education;Balance training   OT Goals Acute Rehab OT Goals OT Goal Formulation: With patient Time For Goal Achievement: 06/17/12 Potential to Achieve Goals: Good ADL Goals Pt Will Perform Grooming: Independently;Standing at sink ADL Goal: Grooming - Progress: Goal set today Pt Will Perform Lower Body Dressing: Independently;Sit to stand from chair;Sit to stand from bed ADL Goal: Lower Body Dressing - Progress: Goal set today Pt Will Transfer to Toilet: with modified independence;Ambulation;with DME ADL Goal: Toilet Transfer - Progress: Goal set today Pt Will Perform Toileting - Clothing Manipulation: Independently;Standing;Sitting on 3-in-1 or toilet ADL Goal: Toileting - Clothing Manipulation - Progress: Goal set today Pt Will Perform Toileting - Hygiene: Independently;Sit to stand from 3-in-1/toilet;Sitting on 3-in-1 or toilet ADL Goal: Toileting - Hygiene - Progress: Goal set today Pt Will Perform Tub/Shower Transfer: Tub transfer;Ambulation;with DME ADL Goal: Tub/Shower Transfer - Progress: Goal set today  Visit Information  Last OT Received On: 06/10/12 Assistance Needed: +1    Subjective Data  Subjective: "My head's really hurting today." (as pt hitting herself in the head- which she says is a "habit") Patient Stated Goal: Return home   Prior Functioning     Home Living Lives With: Family;Friend(s) Available Help at Discharge: Family;Friend(s);Available 24 hours/day Type of Home: House Home Access: Stairs to enter [employer] of Steps: 8 (3 to get to porch, 5 to go up on porch) Entrance Stairs-Rails: Can reach both Home Layout: One level Bathroom Shower/Tub: Tub/shower unit;Door;Curtain Firefighter: Standard Bathroom Accessibility: Yes How  Accessible: Accessible via walker Home Adaptive Equipment: None Prior Function Level of Independence: Independent Able to Take Stairs?: Reciprically Driving: No Vocation:  On disability Communication Communication: No difficulties Dominant Hand: Right         Vision/Perception     Cognition  Overall Cognitive Status: Appears within functional limits for tasks assessed/performed Arousal/Alertness: Awake/alert Orientation Level: Appears intact for tasks assessed Behavior During Session: [facility] for tasks performed Cognition - Other Comments: Seems emotionally immature    Extremity/Trunk Assessment Right Upper Extremity Assessment RUE ROM/Strength/Tone: Within functional levels Left Upper Extremity Assessment LUE ROM/Strength/Tone: Within functional levels Right Lower Extremity Assessment RLE ROM/Strength/Tone: Deficits RLE ROM/Strength/Tone Deficits: grossly 4/5 Left Lower Extremity Assessment LLE ROM/Strength/Tone: Deficits LLE ROM/Strength/Tone Deficits: grossly 4/5     Mobility Bed Mobility Bed Mobility: Supine to Sit;Sitting - Scoot to Edge of Bed;Sit to Supine Supine to Sit: 6: Modified independent (Device/Increase time);HOB elevated Sitting - Scoot to Edge of Bed: 6: Modified independent (Device/Increase time) Sit to Supine: 6: Modified independent (Device/Increase time);HOB elevated Transfers Sit to Stand: 4: Min guard;With upper extremity assist;From bed Stand to Sit: 4: Min guard;To bed;With upper extremity assist     Shoulder Instructions     Exercise     Balance Static Standing Balance Static Standing - Balance Support: No upper extremity supported;During functional activity Static Standing - Level of Assistance: 5: Stand by assistance   End of Session OT - End of Session Equipment Utilized During Treatment: Gait belt Activity Tolerance: Patient tolerated treatment well Patient left:  (ambulating with PT) Nurse Communication: Mobility status  GO      Bentley Fissel 06/10/2012, 1:30 PM

## 2012-06-10 NOTE — Progress Notes (Signed)
Utilization review completed.  

## 2012-06-10 NOTE — Progress Notes (Signed)
ANTIBIOTIC CONSULT NOTE - INITIAL  Pharmacy Consult for may adjust antibiotics for renal function (ESRD) Indication: pan- colitis  Allergies  Allergen Reactions  . Morphine And Related Nausea And Vomiting and Other (See Comments)    "makes me delerirous"  . Tramadol Nausea And Vomiting  . Vicodin (Hydrocodone-Acetaminophen) Rash    Patient Measurements: Height: 5\' 1"  (154.9 cm) Weight: 166 lb 7.2 oz (75.5 kg) IBW/kg (Calculated) : 47.8  Adjusted Body Weight: n/a  Vital Signs: Temp: 98.2 F (36.8 C) (01/14 0820) Temp src: Oral (01/14 0820) BP: 141/80 mmHg (01/14 0820) Pulse Rate: 81  (01/14 0820) Intake/Output from previous day: 01/13 0701 - 01/14 0700 In: 480 [P.O.:480] Out: 0  Intake/Output from this shift:    Labs:  Basename 06/10/12 0610 06/09/12 2015 06/09/12 1350  WBC 7.5 -- 9.2  HGB 10.3* -- 11.4*  PLT 180 -- 191  LABCREA -- -- --  CREATININE 9.26* 8.70* 8.14*   Estimated Creatinine Clearance: 7.6 ml/min (by C-G formula based on Cr of 9.26). No results found for this basename: VANCOTROUGH:2,VANCOPEAK:2,VANCORANDOM:2,GENTTROUGH:2,GENTPEAK:2,GENTRANDOM:2,TOBRATROUGH:2,TOBRAPEAK:2,TOBRARND:2,AMIKACINPEAK:2,AMIKACINTROU:2,AMIKACIN:2, in the last 72 hours   Microbiology: Recent Results (from the past 720 hour(s))  MRSA PCR SCREENING     Status: Normal   Collection Time   06/09/12 10:45 PM      Component Value Range Status Comment   MRSA by PCR NEGATIVE  NEGATIVE Final     Medical History: Past Medical History  Diagnosis Date  . Hyperlipidemia   . Alopecia   . Obesity   . Iron deficiency   . 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 RLS (restless legs syndrome)   . Respiratory failure   . Injury of conjunctiva and corneal abrasion of right eye without foreign body   . Cellulitis   . Dialysis patient     Tues; Thurs; Sat; [place] [facility]  . Angina   . Immunosuppression 08/01/11    currently takes  Prednisone, MMF, and tacrolimus   . Anemia   . Pneumonia 2012    "double"  . Blood transfusion 2004    "when I had my transplant"  . Migraine   . 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 Hyperparathyroidism, secondary   . Diabetic retinopathy(362.0)   . Hypertension     takes Metoprolol and Exforge daily  . Depression     takes Klonopin nightly  . Diabetes mellitus type 1     Pt states diagnosed at age 83 with prior episodes of DKA. S/p pancreatic transplant   . GERD (gastroesophageal reflux disease)     takes Protonix daily  . PONV (postoperative nausea and vomiting)   . Bronchitis   . Migraine   . Asthma   . Shortness of breath   . Emphysema of lung     Medications:  Scheduled:    . [COMPLETED] albuterol  5 mg Nebulization Once  . irbesartan  150 mg Oral Daily   And  . amLODipine  10 mg Oral Daily  . aspirin EC  81 mg Oral Daily  . calcium acetate  2,001-2,668 mg Oral TID WC  . ciprofloxacin  400 mg Intravenous Q24H  . clonazePAM  2 mg Oral QHS  . enoxaparin (LOVENOX) injection  30 mg Subcutaneous Q24H  . [COMPLETED]  HYDROmorphone (DILAUDID) injection  1 mg Intravenous Once  . insulin aspart  0-15 Units Subcutaneous TID WC  . insulin aspart  0-5  Units Subcutaneous QHS  . insulin glargine  6 Units Subcutaneous BID  . insulin glargine  6 Units Subcutaneous BID  . [COMPLETED] ipratropium  0.5 mg Nebulization Once  . metoprolol succinate  100 mg Oral 4 times weekly  . metronidazole  500 mg Intravenous Q8H  . mycophenolate  250 mg Oral Daily  . [COMPLETED] ondansetron (ZOFRAN) IV  4 mg Intravenous Once  . [COMPLETED] ondansetron (ZOFRAN) IV  4 mg Intravenous Once  . pantoprazole (PROTONIX) IV  40 mg Intravenous Q12H  . predniSONE  5 mg Oral Daily  . sodium chloride  3 mL Intravenous Q12H  . tacrolimus  1 mg Oral Daily  . zolpidem  10 mg Oral QHS  . [DISCONTINUED] albuterol  2.5 mg Nebulization Q6H  . [DISCONTINUED] amLODipine-valsartan   1 tablet Oral Daily  . [DISCONTINUED] ipratropium  0.5 mg Nebulization Q6H  . [DISCONTINUED] pantoprazole  40 mg Oral Daily   Assessment: 42 yo female admitted with hyperosmolar hyperglycemia, and nausea/vomiting of unclear etiology.  Abdominal CT pending, patient started on empiric ciprofloxacin 400 mg IV q24 hrs and metronidazole 500 mg IV q 8 hrs.  Pharmacy asked to evaluate regimen with given ESRD.  Current doses are appropriate.  Goal of Therapy:  resolution of infection  Plan:  1. Continue ciprofloxacin and metronidazole at current doses. 2. Pharmacy to follow peripherally.  Thank you!  Dashay Giesler, Gwenlyn Found 06/10/2012,9:53 AM

## 2012-06-10 NOTE — Progress Notes (Signed)
Received patient from ED,admitted due to hyperosmolar hyperglycemia.Patient alert and oriented x4.Oriented to room and unit routines.Advised to call for assist.Call bell within reach.Patient refused telemetry for now,pt wants to wash up first.Will continue to monitor. Rockwell Zentz Joselita,RN

## 2012-06-10 NOTE — Progress Notes (Signed)
Pt complained of sob and o2 sats were 76% on room air. Pt was placed on 2L [place] and sats increased to 86% and pt had no relief. She was then bumped to 3L [place] and her o2 sats improved to 97%. She stated she felt a little better and the MD was paged and notified who stated to inform renal of pt's sob to see

## 2012-06-10 NOTE — ED Provider Notes (Signed)
Medical screening examination/treatment/procedure(s) were performed by non-physician practitioner and as supervising physician I was immediately available for consultation/collaboration.   Flint Melter, MD 06/10/12 [phone]

## 2012-06-10 NOTE — Evaluation (Signed)
Physical Therapy Evaluation Patient Details Name: [name] MRN: [id number] DOB: 1970/06/06 Today's Date: 06/10/2012 Time: 1050-1103 PT Time Calculation (min): 13 min  PT Assessment / Plan / Recommendation Clinical Impression  Pt adm with n/v and hyperglycemia.  Pt needs skilled PT to maximize I and safety so pt can return home with family.  Recommend HHPT and rolling walker.    PT Assessment  Patient needs continued PT services    Follow Up Recommendations  Home health PT    Does the patient have the potential to tolerate intense rehabilitation      Barriers to Discharge        Equipment Recommendations  Rolling walker with 5" wheels    Recommendations for Other Services     Frequency Min 3X/week    Precautions / Restrictions Precautions Precautions: Fall Restrictions Weight Bearing Restrictions: No   Pertinent Vitals/Pain Pt c/o migraine headache      Mobility  Bed Mobility Bed Mobility: Supine to Sit;Sitting - Scoot to Edge of Bed;Sit to Supine Supine to Sit: 6: Modified independent (Device/Increase time);HOB elevated Sitting - Scoot to Edge of Bed: 6: Modified independent (Device/Increase time) Sit to Supine: 6: Modified independent (Device/Increase time);HOB elevated Transfers Transfers: Sit to Stand;Stand to Sit Sit to Stand: 4: Min guard;With upper extremity assist;From bed Stand to Sit: 4: Min guard;To bed;With upper extremity assist Ambulation/Gait Ambulation/Gait Assistance: 4: Min assist Ambulation Distance (Feet): 125 Feet Assistive device: Rolling walker Ambulation/Gait Assistance Details: verbal cues to stay closer to walker Gait Pattern: Step-through pattern;Decreased stride length;Wide base of support Gait velocity: decr    Shoulder Instructions     Exercises     PT Diagnosis: Difficulty walking;Generalized weakness  PT Problem List: Decreased strength;Decreased balance;Decreased mobility;Decreased knowledge of use of DME;Obesity PT  Treatment Interventions: DME instruction;Gait training;Stair training;Functional mobility training;Patient/family education;Therapeutic activities;Therapeutic exercise;Balance training   PT Goals Acute Rehab PT Goals PT Goal Formulation: With patient Time For Goal Achievement: 06/17/12 Potential to Achieve Goals: Good Pt will go Sit to Stand: with modified independence PT Goal: Sit to Stand - Progress: Goal set today Pt will go Stand to Sit: with modified independence PT Goal: Stand to Sit - Progress: Goal set today Pt will Ambulate: >150 feet;with modified independence;with least restrictive assistive device PT Goal: Ambulate - Progress: Goal set today Pt will Go Up / Down Stairs: 1-2 stairs;with supervision;with least restrictive assistive device PT Goal: Up/Down Stairs - Progress: Goal set today  Visit Information  Last PT Received On: 06/10/12 Assistance Needed: +1    Subjective Data  Subjective: Pt states she feels weak. Patient Stated Goal: Return home   Prior Functioning  Home Living Lives With: Family;Friend(s) Available Help at Discharge: Family;Friend(s);Available 24 hours/day Type of Home: House Home Access: Stairs to enter [employer] of Steps: 8 (3 to get to porch, 5 to go up on porch) Entrance Stairs-Rails: Can reach both Home Layout: One level Bathroom Shower/Tub: Tub/shower unit;Door;Curtain [employer] claims handler Accessibility: Yes How Accessible: Accessible via walker Home Adaptive Equipment: None Prior Function Level of Independence: Independent Able to Take Stairs?: Reciprically Driving: No Vocation: On disability Communication Communication: No difficulties Dominant Hand: Right    Cognition  Overall Cognitive Status: Appears within functional limits for tasks assessed/performed Arousal/Alertness: Awake/alert Behavior During Session: [facility] for tasks performed Cognition - Other Comments: Seems emotionally immature      Extremity/Trunk Assessment Right Lower Extremity Assessment RLE ROM/Strength/Tone: Deficits RLE ROM/Strength/Tone Deficits: grossly 4/5 Left Lower Extremity Assessment LLE ROM/Strength/Tone: Deficits  LLE ROM/Strength/Tone Deficits: grossly 4/5   Balance Static Standing Balance Static Standing - Balance Support: No upper extremity supported;During functional activity Static Standing - Level of Assistance: 5: Stand by assistance  End of Session PT - End of Session Activity Tolerance: Patient tolerated treatment well Patient left: in bed;with call bell/phone within reach;with bed alarm set  GP Functional Assessment Tool Used: clinical judgement Functional Limitation: Mobility: Walking and moving around Mobility: Walking and Moving Around Current Status (Z6109): At least 1 percent but less than 20 percent impaired, limited or restricted Mobility: Walking and Moving Around Goal Status [phone]): 0 percent impaired, limited or restricted   [facility] 06/10/2012, 11:27 AM  [facility] PT [phone]

## 2012-06-11 ENCOUNTER — Telehealth: Payer: Self-pay | Admitting: *Deleted

## 2012-06-11 DIAGNOSIS — K219 Gastro-esophageal reflux disease without esophagitis: Secondary | ICD-10-CM

## 2012-06-11 DIAGNOSIS — N058 Unspecified nephritic syndrome with other morphologic changes: Secondary | ICD-10-CM

## 2012-06-11 DIAGNOSIS — R7309 Other abnormal glucose: Secondary | ICD-10-CM

## 2012-06-11 DIAGNOSIS — R51 Headache: Secondary | ICD-10-CM

## 2012-06-11 DIAGNOSIS — E1029 Type 1 diabetes mellitus with other diabetic kidney complication: Secondary | ICD-10-CM

## 2012-06-11 DIAGNOSIS — N186 End stage renal disease: Secondary | ICD-10-CM

## 2012-06-11 LAB — GLUCOSE, CAPILLARY
Glucose-Capillary: 130 mg/dL — ABNORMAL HIGH (ref 70–99)
Glucose-Capillary: 55 mg/dL — ABNORMAL LOW (ref 70–99)
Glucose-Capillary: 30 mg/dL — CL (ref 70–99)
Glucose-Capillary: 40 mg/dL — CL (ref 70–99)
Glucose-Capillary: 91 mg/dL (ref 70–99)

## 2012-06-11 LAB — CBC
MCV: 101.6 fL — ABNORMAL HIGH (ref 78.0–100.0)
Platelets: 165 10*3/uL (ref 150–400)
RBC: 3.08 MIL/uL — ABNORMAL LOW (ref 3.87–5.11)
WBC: 5.6 10*3/uL (ref 4.0–10.5)

## 2012-06-11 LAB — BASIC METABOLIC PANEL
CO2: 26 mEq/L (ref 19–32)
Chloride: 94 mEq/L — ABNORMAL LOW (ref 96–112)
Creatinine, Ser: 5.91 mg/dL — ABNORMAL HIGH (ref 0.50–1.10)
Potassium: 5.2 mEq/L — ABNORMAL HIGH (ref 3.5–5.1)
Sodium: 131 mEq/L — ABNORMAL LOW (ref 135–145)

## 2012-06-11 MED ORDER — PANTOPRAZOLE SODIUM 40 MG PO TBEC
40.0000 mg | DELAYED_RELEASE_TABLET | Freq: Every day | ORAL | Status: DC
  Administered 2012-06-12: 40 mg via ORAL
  Filled 2012-06-11: qty 1

## 2012-06-11 MED ORDER — PNEUMOCOCCAL VAC POLYVALENT 25 MCG/0.5ML IJ INJ
0.5000 mL | INJECTION | INTRAMUSCULAR | Status: AC
  Administered 2012-06-12: 0.5 mL via INTRAMUSCULAR
  Filled 2012-06-11: qty 0.5

## 2012-06-11 MED ORDER — DEXTROSE 50 % IV SOLN
25.0000 mL | Freq: Once | INTRAVENOUS | Status: AC | PRN
  Administered 2012-06-11: 25 mL via INTRAVENOUS

## 2012-06-11 MED ORDER — DEXTROSE 50 % IV SOLN
50.0000 mL | Freq: Once | INTRAVENOUS | Status: AC | PRN
  Administered 2012-06-11: 50 mL via INTRAVENOUS
  Filled 2012-06-11: qty 50

## 2012-06-11 MED ORDER — INSULIN ASPART 100 UNIT/ML ~~LOC~~ SOLN
3.0000 [IU] | Freq: Three times a day (TID) | SUBCUTANEOUS | Status: DC
  Administered 2012-06-12: 3 [IU] via SUBCUTANEOUS

## 2012-06-11 MED ORDER — BUTALBITAL-APAP-CAFFEINE 50-325-40 MG PO TABS
1.0000 | ORAL_TABLET | ORAL | Status: DC | PRN
  Administered 2012-06-11 (×2): 1 via ORAL
  Filled 2012-06-11 (×2): qty 1

## 2012-06-11 MED ORDER — INSULIN GLARGINE 100 UNIT/ML ~~LOC~~ SOLN
6.0000 [IU] | Freq: Every day | SUBCUTANEOUS | Status: DC
  Administered 2012-06-12: 6 [IU] via SUBCUTANEOUS

## 2012-06-11 NOTE — Significant Event (Signed)
CBG: 55  Treatment: D50 IV 25 mL  Symptoms: Shaky  Follow-up CBG: Time:1232 CBG Result:100  Possible Reasons for Event: Inadequate meal intake  Comments/MD notified:Dr. Brown Human RN

## 2012-06-11 NOTE — Progress Notes (Signed)
Hypoglycemic Event  CBG: 63  Treatment: D50 IV 50 mL  Symptoms: Sweaty  Follow-up CBG: Time:1320 CBG Result:145  Possible Reasons for Event: Inadequate meal intake  Comments/MD notified:Spoke with Dr. Lavera Guise via phone at 1245, notifed him that CBG came up after 1st amp of D50 however patient still not able to stay awake long enough to eat anything. Pt awakens to voice but quickly drifts back off to sleep. Dr. Lavera Guise stated to give another amp of D50 to get CBG up so that patient can eat.     Clementeen Graham  Remember to initiate Hypoglycemia Order Set & complete

## 2012-06-11 NOTE — Significant Event (Signed)
CBG: 30  Treatment: 15 GM carbohydrate snack  Symptoms: Sweaty  Follow-up CBG: Time:1745 CBG Result:91  Possible Reasons for Event: Inadequate meal intake  Comments/MD notified:Dr. Brown Human RN

## 2012-06-11 NOTE — Progress Notes (Signed)
[facility] Progress Note  Subjective:   Not feeling much better.  + nausea and non-productive cough. Still has a 'migraine' but states that previous medication she was given caused her to have a seizure.  She does not recall the name.  Hypoglycemic event overnight and another around lunchtime today which resulted in a fall.   Objective Filed Vitals:   06/10/12 1931 06/10/12 2105 06/11/12 0509 06/11/12 0940  BP: 123/72 151/73 119/63 124/70  Pulse: 92 82 81 64  Temp: 97.7 F (36.5 C) 97.5 F (36.4 C) 98.1 F (36.7 C) 98 F (36.7 C)  TempSrc: Oral Oral Oral Oral  Resp:  18 18 18   Height:      Weight:      SpO2: 92% 100% 92% 94%   Physical Exam General: Alert, oriented, NAD Heart: RRR Lungs: Faint wheezes.  Non-productive cough.  No rales or rhonchi noted. Abdomen: Soft, non-tender, non-distended.  Normal BS Extremities: No LE edema. Dialysis Access: Left I-J catheter. Rt thigh AVG maturing/ + bruit (placed 03/14/2012 by Dr. Edilia Bo)   Dialysis Orders: Center: [facility] on [facility] .  EDW 72 kg HD Bath 2K+/ 2.5 CA++ Time 3:45 Heparin 5000. Access Lt I-J/ Rt Thigh AVG maturing BFR 400 DFR A1.5  Hectorol 4 mcg IV/HD Epogen 1600 Units IV/HD Venofer none    Assessment/Plan: 1. Hyperosmolar Hyperglycemia - Admitted with BG of 854 + N/V.  Per [facility] records, sugars have been in the 800's as distantly as 05/19/12. Hypoglycemic events x 2 over the last 24 hours.  On insulin and IV dextrose.  2. Pancolitis - Per CT abdomen and pelvis - C. Diff PCR and stool cultures still pending. On Cipro and Flagyl empirically by primary.  3. 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 Pt's pancreas Tx failed in 2011 according to patient, kidney Tx failed in 2008. She states that she has been maintained on low doses of Cellcept, Prograf and pred by her transplant MD at [facility] (Dr. Resa Miner) to avoid rejection of current organs while  she is on waiting list for another KP transplant. CellCept and Prograf adjusted to home doses BID.   4. Acute bronchitis - Per primary. Albuterol nebs. Pt already on prednisone. Negative for flu by PCR. 5. Pruritus - Chronic. Phosphorus is controlled. Hydroxyzine 25 mg Q8 PRN.  6. ESRD - TTS at [facility]. K+ 5.2. HD ordered for tomorrow.  7. Hypertension/volume - SBPs 110s to 150's. CXR 1/13 with pulmonary vascular congestion and questionable pulmonary edema. Now less than 1 kg above EDW. 8. Anemia - Hgb 9.9  Tsat 44% in December. Ferritin 754 in Nov. Aranesp 25 Q Tues HD and hold IV iron for now.  9. Metabolic bone disease - Ca++ 9.2 (9.4 corrected.) Last PTH 305.4 in Dec. P 3.4. Continue Ca Acetate TID WC. On Hectorol 4 mcg  10. Nutrition - Albumin 3.7. High protein renal diet and multivitamin.  11. DM - on insulin per primary. Followed  OP by Dr. Elvera Lennox (endocrine). Seen by inpatient DM mgmt. 12. Dispo- OK for d/c from renal standpoint  Clydie Braun E. Thad Ranger [facility] [phone] pager 06/11/2012,12:25 PM  LOS: 2 days   Patient seen and examined and reviewed with Claud Kelp P.A..  Agree with assessment and plan as above. Vinson Moselle  MD [employer] [phone] pgr    [phone] cell 06/11/2012, 2:09 PM   Additional Objective Labs: Basic Metabolic Panel:  Lab  06/11/12 0720 06/10/12 0610 06/09/12 2015  NA 131* 132* 132*  K 5.2* 5.1 4.8  CL 94* 91* 94*  CO2 26 22 18*  GLUCOSE 383* 195* 181*  BUN 31* 66* 64*  CREATININE 5.91* 9.26* 8.70*  CALCIUM 9.2 10.2 9.2  ALB -- -- --  PHOS -- -- --   Liver Function Tests:  Lab 06/09/12 1350  AST 217*  ALT 89*  ALKPHOS 146*  BILITOT 0.3  PROT 7.1  ALBUMIN 3.7   CBC:  Lab 06/11/12 0720 06/10/12 0610 06/09/12 1350  WBC 5.6 7.5 9.2  NEUTROABS -- -- --  HGB 9.9* 10.3* 11.4*  HCT 31.3* 31.2* 36.6  MCV 101.6* 99.0 102.2*  PLT 165 180 191   Blood Culture    Component Value Date/Time   SDES ABSCESS  LABIA RIGHT 11/05/2011 1655   SPECREQUEST NONE 11/05/2011 1655   CULT MODERATE ESCHERICHIA COLI 11/05/2011 1655   REPTSTATUS 11/07/2011 FINAL 11/05/2011 1655    CBG:  Lab 06/11/12 0737 06/10/12 2144 06/10/12 2100 06/10/12 1147 06/10/12 0801  GLUCAP 386* 146* 64* 164* 185*   Studies/Results: Ct Abdomen Pelvis Wo Contrast  06/10/2012  *RADIOLOGY REPORT*  Clinical Data: Nausea and vomiting for 2 days.  Elevated blood sugars.  Fevers.  CT ABDOMEN AND PELVIS WITHOUT CONTRAST  Technique:  Multidetector CT imaging of the abdomen and pelvis was performed following the standard protocol without intravenous contrast.  Comparison: 09/14/2010.  Findings: The small right pleural effusion.  Diffuse airspace infiltration noted in the lung bases.  Changes could represent edema or pneumonia.  Mild cardiac enlargement.  There appears to be a small esophageal hiatal hernia with opaque densities in the distal esophagus likely representing opaque medication.  Opaque medication also present in the stomach.  Surgical absence of the gallbladder.  The unenhanced appearance of the liver, spleen, pancreas, adrenal glands, and retroperitoneal lymph nodes is unremarkable. The kidneys are atrophic and appear to be a cyst filled.  No hydronephrosis.  Vascular calcifications. Calcification of the aorta without aneurysm.  The stomach, small bowel, and colon are not abnormally distended.  There are postoperative changes in the right lower quadrant correlating with history of pancreatic transplantation.  There is mild diffuse thickening of the wall of the colon with hazy pericolonic infiltration and infiltration in the omentum.  Changes suggest diffuse colitis. No free air or free fluid in the abdomen.  Pelvis:  There is a small amount of free fluid in the pelvis.  The uterus and adnexal structures are not significantly enlarged. Rounded structure in the left pelvis measuring 2.5 x 3.3 cm likely represents transplant kidney and is decreasing  in size since the previous study. No loculated pelvic fluid collections.  The appendix is normal.  No diverticulitis.  IMPRESSION: Diffuse colonic wall thickening and pericolonic infiltration suggesting pan colitis.  Small right pleural effusion.  Airspace infiltration in both lung bases.   Original Report Authenticated By: Burman Nieves, M.D.    Dg Chest 2 View  06/10/2012  *RADIOLOGY REPORT*  Clinical Data: Hypoxemia, dialysis patient  CHEST - 2 VIEW  Comparison: 06/09/2012  Findings: Left IJ dialysis catheter tips in the SVC RA junction. Stable cardiomegaly.  Resolving mild edema pattern throughout the lower lobes.  No current CHF or effusion.  No focal pneumonia, collapse consolidation.  Negative for pneumothorax.  Trachea is midline.  Prior cholecystectomy noted.  IMPRESSION: Resolving mild edema pattern.   Original Report Authenticated By: Judie Petit. Miles Costain, M.D.    Dg Chest 2 View  06/09/2012  *RADIOLOGY REPORT*  Clinical Data: Cough, shortness of breath, history hypertension, diabetes, end-stage renal disease, asthma  CHEST - 2 VIEW  Comparison: 12/08/2011  Findings: Left jugular dual-lumen central venous catheter with distal tip projecting over cavoatrial junction. Enlargement of cardiac silhouette with pulmonary vascular congestion. Question minimal perihilar edema particularly on the right. No gross pleural effusion or pneumothorax. Bones diffusely demineralized.  IMPRESSION: Enlargement of cardiac silhouette with pulmonary vascular congestion and question minimal pulmonary edema/failure.   Original Report Authenticated By: Ulyses Southward, M.D.    Medications:      . irbesartan  150 mg Oral Daily   And  . amLODipine  10 mg Oral Daily  . aspirin EC  81 mg Oral Daily  . calcium acetate  2,001-2,668 mg Oral TID WC  . clonazePAM  2 mg Oral QHS  . darbepoetin (ARANESP) injection - DIALYSIS  25 mcg Intravenous Q Tue-HD  . doxercalciferol  4 mcg Intravenous Q T,Th,Sa-HD  . enoxaparin (LOVENOX) injection   30 mg Subcutaneous Q24H  . insulin aspart  0-15 Units Subcutaneous TID WC  . insulin aspart  0-5 Units Subcutaneous QHS  . insulin glargine  6 Units Subcutaneous BID  . metoprolol succinate  100 mg Oral 4 times weekly  . multivitamin  1 tablet Oral QHS  . mycophenolate  250 mg Oral BID  . pantoprazole (PROTONIX) IV  40 mg Intravenous Q12H  . pneumococcal 23 valent vaccine  0.5 mL Intramuscular Tomorrow-1000  . predniSONE  5 mg Oral Daily  . sodium chloride  3 mL Intravenous Q12H  . tacrolimus  1 mg Oral BID

## 2012-06-11 NOTE — Progress Notes (Signed)
Inpatient Diabetes Program Recommendations  AACE/ADA: New Consensus Statement on Inpatient Glycemic Control (2013)  Target Ranges:  Prepandial:   less than 140 mg/dL      Peak postprandial:   less than 180 mg/dL (1-2 hours)      Critically ill patients:  140 - 180 mg/dL    Results for CYANI, KALLSTROM (MRN [id number]) as of 06/11/2012 14:30  Ref. Range 06/11/2012 07:37 06/11/2012 12:05 06/11/2012 12:11 06/11/2012 12:28 06/11/2012 12:40 06/11/2012 13:01 06/11/2012 13:19  Glucose-Capillary Latest Range: 70-99 mg/dL 409 (H) 20 (LL) 811 (H) 55 (L) 100 (H) 63 (L) 145 (H)    Noted patient was hyperglycemic this morning with CBG of 386 mg/dl.  Patient received 15 units Novolog for this blood glucose this morning.  As a result, patient's CBG by noon dropped to 20 mg/dl.  This patient does not do well with extremely large doses of Novolog, and has had issues with hypoglycemia in the past during previous hospitalizations.  Recommend the following: 1. Decrease Novolog correction scale (SSI) to Sensitive scale (currently ordered as Moderate scale)  2. May want to add low dose meal coverage to cover the carbohydrates she eats to prevent extreme hyperglycemia-   Novolog 3 units tid with meals   3. Continue current Lantus dose  Note: Will follow. [name] RN, MSN, CDE Diabetes Coordinator Inpatient Diabetes Program [phone]

## 2012-06-11 NOTE — Significant Event (Signed)
CBG: 20  Treatment: D50 IV 25 mL  Symptoms: Sweaty and Shaky  Follow-up CBG: Time1210 CBG Result:130  Possible Reasons for Event: Inadequate meal intake  Comments/MD notified:DR. Lavera Guise  Laverda Sorenson RN

## 2012-06-11 NOTE — Progress Notes (Signed)
Hypoglycemic Event  CBG: 64  Treatment: 15 GM carbohydrate snack  Symptoms: Pale and None  Follow-up CBG: Time:21:44 CBG Result:146  Possible Reasons for Event: Unknown  Comments/MD notified:per hypoglycemia protocol    [name]  Remember to initiate Hypoglycemia Order Set & complete

## 2012-06-11 NOTE — Progress Notes (Addendum)
Pt was found on the floor by the house keeper. Pt was very lethargic and hard to arouse. She stated she felt like her blood sugar was low. cbg was checked and noted as 20. Pt was given D25 and blood sugar was rechecked. It increased to 130. Pt woke up and we assisted her into the bed. She stated she did not remember what happened. She denied any pain. No bruising or abrasions noted. MD was notified and stated to continue to monitor pt closely. Pt was given her lunch and cbg was rechecked it dropped to 55. Pt was given another amp of D25. Cbg increased to 100. Pt was monitored as she ate her lunch. CBG increased 145. Pt became more awake and alert. VS stable. Will cont to closely monitor.

## 2012-06-11 NOTE — Progress Notes (Signed)
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 Patient initially treated with insulin Gtt. Unclear why it got out of control prior to admission  She was then switched to her home dose of  Lantus 6 units BID with novolog moderate SSI with meals. On 06/11/12 patient developed severe hypoglycemia requiring repeat doses of D 50. Plan to change lantus to 6 units daily. Avoid sliding scale. Give novolog 3 units with meals and monitor closely. 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   6-Acute bronchitis: Continue albuterol nebs breathing treatments, patient is already on prednisone. Patient relates sore throat, fever, cough for last 3 days. Influenza panel negative. Started on  Tamiflu empirically. Stoped on 06/11/12   7-Colitis: Patient presents with nausea, vomiting, abdominal pain. CT abdomen show pan-colitis, but patient has denied diarrhea and she says she has no abdominal pain. She did receive Ciprofloxacin and Flagyl empirically but we have DCed those abx without evidence of clinical symptomatic colitis. Findings may be related to cellcept. Follow up as outpt. GI consult for colonoscopy if symptomatic.     Code Status: Full. Family Communication: I spoke directly with patient.  Disposition Plan: Home when stable.    Consultants:  Nephrology.  Procedures:  Dialysis.  Antibiotics:  Ciprofloxacin 01-14 to 1/15  Flagyl 01-14 to 1/15  HPI/Subjective: Says she does not feel well. Has a headache.  Objective: Filed Vitals:   06/10/12 1853 06/10/12 1931 06/10/12 2105 06/11/12 0509  BP:  117/62 123/72 151/73 119/63  Pulse: 80 92 82 81  Temp: 97.3 F (36.3 C) 97.7 F (36.5 C) 97.5 F (36.4 C) 98.1 F (36.7 C)  TempSrc: Oral Oral Oral Oral  Resp: 17  18 18   Height:      Weight: 72.8 kg (160 lb 7.9 oz)     SpO2: 93% 92% 100% 92%   Patient Vitals for the past 24 hrs:  BP Temp Temp src Pulse Resp SpO2 Weight  06/11/12 1723 104/55 mmHg 98 F (36.7 C) Oral 76  18  98 % -  06/11/12 1400 110/64 mmHg 98.1 F (36.7 C) Oral 81  18  96 % -  06/11/12 1210 107/72 mmHg 98.6 F (37 C) Oral 77  18  96 % -  06/11/12 0940 124/70 mmHg 98 F (36.7 C) Oral 64  18  94 % -  06/11/12 0509 119/63 mmHg 98.1 F (36.7 C) Oral 81  18  92 % -  06/10/12 2105 151/73 mmHg 97.5 F (36.4 C) Oral 82  18  100 % -  06/10/12 1931 123/72 mmHg 97.7 F (36.5 C) Oral 92  - 92 % -  06/10/12 1853 117/62 mmHg 97.3 F (36.3 C) Oral 80  17  93 % 72.8 kg (160 lb 7.9 oz)  06/10/12 1830 99/51 mmHg - - 73  13  - -     Intake/Output Summary (Last 24 hours) at 06/11/12 1043 Last data filed at 06/11/12 0700  Gross per 24 hour  Intake      0 ml  Output   3907 ml  Net  -3907 ml   Filed Weights   06/09/12 2329 06/10/12 1445 06/10/12 1853  Weight: 75.5 kg (166 lb 7.2 oz)  76.8 kg (169 lb 5 oz) 72.8 kg (160 lb 7.9 oz)    Exam:   General:  No distress.  Cardiovascular: S 1, S 2 RRR  Respiratory: CTA  Abdomen: BS present, soft, NT  Data Reviewed: Basic Metabolic Panel:  Lab 06/11/12 1610 06/10/12 0610 06/09/12 2015 06/09/12 1746 06/09/12 1350  NA 131* 132* 132* -- 125*  K 5.2* 5.1 4.8 5.8* 6.3*  CL 94* 91* 94* -- 88*  CO2 26 22 18* -- 20  GLUCOSE 383* 195* 181* -- 854*  BUN 31* 66* 64* -- 61*  CREATININE 5.91* 9.26* 8.70* -- 8.14*  CALCIUM 9.2 10.2 9.2 -- 9.2  MG -- -- -- -- --  PHOS -- -- -- -- --   Liver Function Tests:  Lab 06/09/12 1350  AST 217*  ALT 89*  ALKPHOS 146*  BILITOT 0.3  PROT 7.1  ALBUMIN 3.7   No results found for this basename: LIPASE:5,AMYLASE:5 in the last 168  hours No results found for this basename: AMMONIA:5 in the last 168 hours CBC:  Lab 06/11/12 0720 06/10/12 0610 06/09/12 1350  WBC 5.6 7.5 9.2  NEUTROABS -- -- --  HGB 9.9* 10.3* 11.4*  HCT 31.3* 31.2* 36.6  MCV 101.6* 99.0 102.2*  PLT 165 180 191   Cardiac Enzymes: No results found for this basename: CKTOTAL:5,CKMB:5,CKMBINDEX:5,TROPONINI:5 in the last 168 hours BNP (last 3 results) No results found for this basename: PROBNP:3 in the last 8760 hours CBG:  Lab 06/11/12 0737 06/10/12 2144 06/10/12 2100 06/10/12 1147 06/10/12 0801  GLUCAP 386* 146* 64* 164* 185*    Recent Results (from the past 240 hour(s))  MRSA PCR SCREENING     Status: Normal   Collection Time   06/09/12 10:45 PM      Component Value Range Status Comment   MRSA by PCR NEGATIVE  NEGATIVE Final      Studies: Ct Abdomen Pelvis Wo Contrast  06/10/2012  *RADIOLOGY REPORT*  Clinical Data: Nausea and vomiting for 2 days.  Elevated blood sugars.  Fevers.  CT ABDOMEN AND PELVIS WITHOUT CONTRAST  Technique:  Multidetector CT imaging of the abdomen and pelvis was performed following the standard protocol without intravenous contrast.  Comparison: 09/14/2010.  Findings: The small right pleural effusion.  Diffuse airspace infiltration noted in the lung bases.  Changes could represent edema or pneumonia.  Mild cardiac enlargement.  There appears to be a small esophageal hiatal hernia with opaque densities in the distal esophagus likely representing opaque medication.  Opaque medication also present in the stomach.  Surgical absence of the gallbladder.  The unenhanced appearance of the liver, spleen, pancreas, adrenal glands, and retroperitoneal lymph nodes is unremarkable. The kidneys are atrophic and appear to be a cyst filled.  No hydronephrosis.  Vascular calcifications. Calcification of the aorta without aneurysm.  The stomach, small bowel, and colon are not abnormally distended.  There are postoperative changes in the right  lower quadrant correlating with history of pancreatic transplantation.  There is mild diffuse thickening of the wall of the colon with hazy pericolonic infiltration and infiltration in the omentum.  Changes suggest diffuse colitis. No free air or free fluid in the abdomen.  Pelvis:  There is a small amount of free fluid in the pelvis.  The uterus and adnexal structures are not significantly enlarged. Rounded structure in the left pelvis measuring 2.5 x 3.3 cm likely represents transplant kidney and is decreasing in size since the previous study. No loculated pelvic fluid collections.  The  appendix is normal.  No diverticulitis.  IMPRESSION: Diffuse colonic wall thickening and pericolonic infiltration suggesting pan colitis.  Small right pleural effusion.  Airspace infiltration in both lung bases.   Original Report Authenticated By: Burman Nieves, M.D.    Dg Chest 2 View  06/10/2012  *RADIOLOGY REPORT*  Clinical Data: Hypoxemia, dialysis patient  CHEST - 2 VIEW  Comparison: 06/09/2012  Findings: Left IJ dialysis catheter tips in the SVC RA junction. Stable cardiomegaly.  Resolving mild edema pattern throughout the lower lobes.  No current CHF or effusion.  No focal pneumonia, collapse consolidation.  Negative for pneumothorax.  Trachea is midline.  Prior cholecystectomy noted.  IMPRESSION: Resolving mild edema pattern.   Original Report Authenticated By: Judie Petit. Shick, M.D.    Dg Chest 2 View  06/09/2012  *RADIOLOGY REPORT*  Clinical Data: Cough, shortness of breath, history hypertension, diabetes, end-stage renal disease, asthma  CHEST - 2 VIEW  Comparison: 12/08/2011  Findings: Left jugular dual-lumen central venous catheter with distal tip projecting over cavoatrial junction. Enlargement of cardiac silhouette with pulmonary vascular congestion. Question minimal perihilar edema particularly on the right. No gross pleural effusion or pneumothorax. Bones diffusely demineralized.  IMPRESSION: Enlargement of cardiac  silhouette with pulmonary vascular congestion and question minimal pulmonary edema/failure.   Original Report Authenticated By: Ulyses Southward, M.D.     Scheduled Meds:    . irbesartan  150 mg Oral Daily   And  . amLODipine  10 mg Oral Daily  . aspirin EC  81 mg Oral Daily  . calcium acetate  2,001-2,668 mg Oral TID WC  . clonazePAM  2 mg Oral QHS  . darbepoetin (ARANESP) injection - DIALYSIS  25 mcg Intravenous Q Tue-HD  . doxercalciferol  4 mcg Intravenous Q T,Th,Sa-HD  . enoxaparin (LOVENOX) injection  30 mg Subcutaneous Q24H  . influenza  inactive virus vaccine  0.5 mL Intramuscular Tomorrow-1000  . insulin aspart  0-15 Units Subcutaneous TID WC  . insulin aspart  0-5 Units Subcutaneous QHS  . insulin glargine  6 Units Subcutaneous BID  . metoprolol succinate  100 mg Oral 4 times weekly  . multivitamin  1 tablet Oral QHS  . mycophenolate  250 mg Oral BID  . oseltamivir  30 mg Oral Q T,Th,Sa-HD  . pantoprazole (PROTONIX) IV  40 mg Intravenous Q12H  . pneumococcal 23 valent vaccine  0.5 mL Intramuscular Tomorrow-1000  . predniSONE  5 mg Oral Daily  . sodium chloride  3 mL Intravenous Q12H  . tacrolimus  1 mg Oral BID   Continuous Infusions:   Principal Problem:  *Hyperosmolar hyperglycemia Active Problems:  Hypertension  ESRD (end stage renal disease)  DM (diabetes mellitus), type 1 with renal complications  GERD (gastroesophageal reflux disease)  Obesity  Immunosuppression  Nausea & vomiting     Adara Kittle  [facility] Pager [phone]. If 8PM-8AM, please contact night-coverage at [email or web address], password [facility] 06/11/2012, 10:43 AM  LOS: 2 days

## 2012-06-11 NOTE — Telephone Encounter (Signed)
Forms for Diabetic supplies faxed to [facility].

## 2012-06-11 NOTE — Progress Notes (Signed)
Pt's bed alarm was turned off per pt's request stating that she did not need it on she moves fine without assistance. So alarm was turned off. Will cont to monitor.

## 2012-06-12 LAB — CBC
MCH: 32.7 pg (ref 26.0–34.0)
MCV: 101 fL — ABNORMAL HIGH (ref 78.0–100.0)
Platelets: 159 10*3/uL (ref 150–400)
RDW: 13.7 % (ref 11.5–15.5)
WBC: 5.6 10*3/uL (ref 4.0–10.5)

## 2012-06-12 LAB — GLUCOSE, CAPILLARY
Glucose-Capillary: 20 mg/dL — CL (ref 70–99)
Glucose-Capillary: 297 mg/dL — ABNORMAL HIGH (ref 70–99)
Glucose-Capillary: 322 mg/dL — ABNORMAL HIGH (ref 70–99)

## 2012-06-12 LAB — RENAL FUNCTION PANEL
Albumin: 3.3 g/dL — ABNORMAL LOW (ref 3.5–5.2)
Calcium: 10.3 mg/dL (ref 8.4–10.5)
Chloride: 89 mEq/L — ABNORMAL LOW (ref 96–112)
Creatinine, Ser: 7.95 mg/dL — ABNORMAL HIGH (ref 0.50–1.10)
GFR calc non Af Amer: 6 mL/min — ABNORMAL LOW (ref >90)
Phosphorus: 4 mg/dL (ref 2.3–4.6)

## 2012-06-12 MED ORDER — HYDROMORPHONE HCL PF 1 MG/ML IJ SOLN
INTRAMUSCULAR | Status: AC
  Administered 2012-06-12: 1 mg
  Filled 2012-06-12: qty 1

## 2012-06-12 MED ORDER — CAMPHOR-MENTHOL 0.5-0.5 % EX LOTN
TOPICAL_LOTION | CUTANEOUS | Status: DC | PRN
  Filled 2012-06-12: qty 222

## 2012-06-12 MED ORDER — DOXERCALCIFEROL 4 MCG/2ML IV SOLN
INTRAVENOUS | Status: AC
  Administered 2012-06-12: 4 ug
  Filled 2012-06-12: qty 2

## 2012-06-12 MED ORDER — ONDANSETRON HCL 4 MG/2ML IJ SOLN
INTRAMUSCULAR | Status: AC
  Administered 2012-06-12: 4 mg
  Filled 2012-06-12: qty 2

## 2012-06-12 MED ORDER — HEPARIN SODIUM (PORCINE) 1000 UNIT/ML IJ SOLN: 5000.0000 [IU] | Freq: Once | INTRAMUSCULAR | Status: DC

## 2012-06-12 MED ORDER — HYDROXYZINE HCL 25 MG PO TABS
ORAL_TABLET | ORAL | Status: AC
  Administered 2012-06-12: 25 mg
  Filled 2012-06-12: qty 1

## 2012-06-12 MED ORDER — VALPROATE SODIUM 500 MG/5ML IV SOLN
250.0000 mg | INTRAVENOUS | Status: AC
  Administered 2012-06-12: 250 mg via INTRAVENOUS
  Filled 2012-06-12: qty 2.5

## 2012-06-12 MED ORDER — INSULIN GLARGINE 100 UNIT/ML ~~LOC~~ SOLN: 6.0000 [IU] | Freq: Every day | SUBCUTANEOUS | Status: DC

## 2012-06-12 MED ORDER — DIVALPROEX SODIUM ER 250 MG PO TB24: 250.0000 mg | ORAL_TABLET | Freq: Every day | ORAL | Status: DC

## 2012-06-12 NOTE — Progress Notes (Signed)
[facility] Progress Note  Subjective:   Still complains of lingering migraine and cough.  Ongoing issues with hypoglycemia.  Objective Filed Vitals:   06/11/12 2120 06/12/12 0609 06/12/12 0830 06/12/12 0841  BP: 127/73 127/62 149/79 124/71  Pulse: 83 73 81 78  Temp: 98.4 F (36.9 C) 97.5 F (36.4 C) 98.4 F (36.9 C)   TempSrc: Oral Oral Oral   Resp: 18 18 15 16   Height:      Weight:   75.4 kg (166 lb 3.6 oz)   SpO2: 95% 92% 99%    Physical Exam General: Alert, resting in bed. NAD Heart: RRR Lungs: CTA bilaterally.  No wheezes, rales or rhonchi appreciated Abdomen: Soft, non-tender, non distended.  Normal BS Extremities: No LE edema Dialysis Access: Left I-J in use.  Rt thigh AVG maturing (placed by Edilia Bo 03/14/12)  Dialysis Orders: Center: [facility] on TTS .  EDW 72 kg HD Bath 2K+/ 2.5 CA++ Time 3:45 Heparin 5000. Access Lt I-J/ Rt Thigh AVG maturing BFR 400 DFR A1.5 Hectorol 4 mcg IV/HD Epogen 1600 Units IV/HD Venofer none    Assessment/Plan: 1. Hyperosmolar Hyperglycemia - Admitted with BG of 854 + N/V. Continued hypoglycemic events  Sliding scale Novolog decreased from moderate to sensitive scale with low dose meal coverage per Inpatient Diabetes Prg. 2. Acute bronchitis - Per primary. Albuterol nebs. Pt already on prednisone. Negative for flu by PCR. 3. Pruritus - Chronic. Phosphorus is controlled. Hydroxyzine 25 mg Q8 PRN.  4. Colonic thickening- Per CT abdomen and pelvis; on Cipro and Flagyl empirically by primary. Stool studies (Cdif, etc) look to have been cancelled 5. ESRD - TTS at [facility]. K+ 5.2.   6. Hx of KP transplant - She is s/p failed kidney-pancreas transplant; she has been maintained on low doses of Cellcept, Prograf and pred by her transplant MD at [facility] (Dr. Resa Miner) to avoid rejection of current organs while she is on waiting list for another KP transplant. 7. Hypertension/volume - SBPs 120s to 140's, up 3 kg today, UF for same as tolerated. Challenge  EDW on next HD sat.  8. Anemia - Hgb 9.9 Tsat 44% in December. Ferritin 754 in Nov. Aranesp 25 Q Tues HD and hold IV iron for now.  9. Metabolic bone disease - Ca++ 9.2 (9.4 corrected.) Last PTH 305.4 in Dec. P 3.4. Continue Ca Acetate TID WC. On Hectorol 4 mcg  10. Nutrition - Albumin 3.7. High protein renal diet and multivitamin.  11. DM - on insulin per primary. Followed OP by Dr. Elvera Lennox (endocrine). Seen by inpatient DM mgmt                       Scot Jun. Thad Ranger [facility] [phone] pager 06/12/2012,9:26 AM  LOS: 3 days   Patient seen and examined and reviewed with Claud Kelp P.A..  Agree with assessment and plan as above. Vinson Moselle  MD [facility] [phone] pgr    [phone] cell 06/12/2012, 11:00 AM   Additional Objective Labs: Basic Metabolic Panel:  Lab 06/11/12 2130 06/10/12 0610 06/09/12 2015  NA 131* 132* 132*  K 5.2* 5.1 4.8  CL 94* 91* 94*  CO2 26 22 18*  GLUCOSE 383* 195* 181*  BUN 31* 66* 64*  CREATININE 5.91* 9.26* 8.70*  CALCIUM 9.2 10.2 9.2  ALB -- -- --  PHOS -- -- --   Liver Function Tests:  Lab 06/09/12 1350  AST 217*  ALT 89*  ALKPHOS  146*  BILITOT 0.3  PROT 7.1  ALBUMIN 3.7    CBC:  Lab 06/11/12 0720 06/10/12 0610 06/09/12 1350  WBC 5.6 7.5 9.2  NEUTROABS -- -- --  HGB 9.9* 10.3* 11.4*  HCT 31.3* 31.2* 36.6  MCV 101.6* 99.0 102.2*  PLT 165 180 191   Blood Culture    Component Value Date/Time   SDES ABSCESS LABIA RIGHT 11/05/2011 1655   SPECREQUEST NONE 11/05/2011 1655   CULT MODERATE ESCHERICHIA COLI 11/05/2011 1655   REPTSTATUS 11/07/2011 FINAL 11/05/2011 1655    CBG:  Lab 06/12/12 0753 06/11/12 2111 06/11/12 1807 06/11/12 1716 06/11/12 1644  GLUCAP 322* 297* 91 30* 40*   Studies/Results: Dg Chest 2 View  06/10/2012  *RADIOLOGY REPORT*  Clinical Data: Hypoxemia, dialysis patient  CHEST - 2 VIEW  Comparison: 06/09/2012  Findings: Left IJ dialysis catheter tips in the SVC RA junction.  Stable cardiomegaly.  Resolving mild edema pattern throughout the lower lobes.  No current CHF or effusion.  No focal pneumonia, collapse consolidation.  Negative for pneumothorax.  Trachea is midline.  Prior cholecystectomy noted.  IMPRESSION: Resolving mild edema pattern.   Original Report Authenticated By: Judie Petit. Miles Costain, M.D.    Medications:      . irbesartan  150 mg Oral Daily   And  . amLODipine  10 mg Oral Daily  . aspirin EC  81 mg Oral Daily  . calcium acetate  2,001-2,668 mg Oral TID WC  . clonazePAM  2 mg Oral QHS  . darbepoetin (ARANESP) injection - DIALYSIS  25 mcg Intravenous Q Tue-HD  . doxercalciferol  4 mcg Intravenous Q T,Th,Sa-HD  . enoxaparin (LOVENOX) injection  30 mg Subcutaneous Q24H  . heparin  5,000 Units Intravenous Once  . HYDROmorphone      . insulin aspart  3 Units Subcutaneous TID WC  . insulin glargine  6 Units Subcutaneous Daily  . metoprolol succinate  100 mg Oral 4 times weekly  . multivitamin  1 tablet Oral QHS  . mycophenolate  250 mg Oral BID  . ondansetron      . pantoprazole  40 mg Oral Q1200  . pneumococcal 23 valent vaccine  0.5 mL Intramuscular Tomorrow-1000  . predniSONE  5 mg Oral Daily  . sodium chloride  3 mL Intravenous Q12H  . tacrolimus  1 mg Oral BID

## 2012-06-12 NOTE — Discharge Summary (Signed)
Physician Discharge Summary  SOLENE HEREFORD AVW:[id number] DOB: 1971-02-17 DOA: 06/09/2012  PCP: Oliver Barre, MD  Admit date: 06/09/2012 Discharge date: 06/12/2012  Time spent: 50 minutes   Discharge Diagnoses:  Hyperosmolar hyperglycemia syndrome - resolved   Hypertension  ESRD (end stage renal disease)  DM (diabetes mellitus), type 1 with renal complications  GERD (gastroesophageal reflux disease)  Obesity  Immunosuppression  Nausea & vomiting Severe hypoglycemia-resolved Brittle diabetes Migraine headaches   Discharge Condition: Good  Diet recommendation: Renal diet  Filed Weights   06/10/12 1853 06/12/12 0830 06/12/12 1230  Weight: 72.8 kg (160 lb 7.9 oz) 75.4 kg (166 lb 3.6 oz) 73.2 kg (161 lb 6 oz)    History of present illness:  Patient is a 42 year old female with history of insulin-dependent diabetes mellitus, end-stage renal disease on HD TTS, 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 She had her usual hemodialysis on Saturday without any difficulty. Per patient her blood sugars have been consistently running high and 800s for the past week and despite Lantus and sliding scale insulin she has not been able to get it down. Patient came to the emergency room for nausea and vomiting for last 2 days and consistently elevated blood sugars. She also report having fever last week of 101. In the ED patient was started on glucose stabilizer, her blood sugars has improved to 181. Patient also reports that she was wheezing at the time of arrival to ED. [facility] was consulted for further management   Hospital Course:  1-Hyperosmolar hyperglycemia// DM (diabetes mellitus), type 1 with renal complications extremely brittle.  Patient initially treated with insulin Gtt. Unclear why her diabetes got soout of control prior to admission . Patient seems knowledgeable about her disease  and compliant.  She was then switched to her home dose of Lantus 6 units BID with novolog moderate SSI with meals. On 06/11/12 patient developed severe hypoglycemia requiring repeat doses of D 50. We did  change lantus to 6 units daily with novolog 3 units with meals and monitor the patient closely without further hypoglycemia or hyperglycemia noted.  2-Hypertension: the patient was Continued on her medications,  3-ESRD: The patient did receive hemodialysis as per her schedule 4-GERD (gastroesophageal reflux disease):  - Placed on IV PPI then changed to oral proton inhibitor prior to discharge 5-Immunosuppression with history of renal/pancreas transplant, :  - Continue CellCept, Prograf, prednisone. No adjustments were made to immunosuppressants 6-Acute bronchitis: Continue albuterol nebs breathing treatments, patient is already on prednisone. Patient relates sore throat, fever, cough for last 3 days. Influenza panel negative. Started on Tamiflu empirically. Stoped on 06/11/12 . She remained asymptomatic and we did not determine she needs any further antibiotics at discharge.  7-Colitis: Patient presents with nausea, vomiting, abdominal pain. CT abdomen show pan-colitis, but patient has denied diarrhea and she says she has no abdominal pain. She did receive Ciprofloxacin and Flagyl empirically but we have DCed those abx without evidence of clinical symptomatic colitis. Findings may be related to cellcept. Follow up as outpt. GI consult for colonoscopy if symptomatic. 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 migraine headaches-patient responded well to a trial of IV Depacon. We did prescribe Depakote extended release 250 mg at bedtime at discharge     Procedures: HD   Consultations:  nephrology  Discharge Exam: Filed Vitals:   06/12/12 1129 06/12/12 1159 06/12/12  1230 06/12/12 1255  BP: 126/70 127/74 124/67 130/83  Pulse:  87 87 88 93  Temp:   98.2 F (36.8 C) 97.4 F (36.3 C)  TempSrc:   Oral Oral  Resp: 18 20 20 20   Height:      Weight:   73.2 kg (161 lb 6 oz)   SpO2:   93% 95%    General: axox3 Cardiovascular: rrr, no m,r,g Respiratory: ctab   Discharge Instructions  Discharge Orders    Future Orders Please Complete By Expires   Diet renal 60/70-06-29-1198      Increase activity slowly          Medication List     As of 06/12/2012  4:37 PM    STOP taking these medications         ACCU-CHEK ADVANTAGE DIABETES kit      amLODipine-valsartan 10-160 MG per tablet   Commonly known as: EXFORGE      glucagon 1 MG injection      glucose blood test strip      TAKE these medications         albuterol 108 (90 BASE) MCG/ACT inhaler   Commonly known as: PROVENTIL HFA;VENTOLIN HFA   Inhale 2 puffs into the lungs every 4 (four) hours as needed. For wheezing.      aspirin EC 81 MG tablet   Take 81 mg by mouth daily.      calcium acetate 667 MG capsule   Commonly known as: PHOSLO   Take 2,001-2,668 mg by mouth 3 (three) times daily with meals. Dosage depends on meal consumed      clonazePAM 0.5 MG tablet   Commonly known as: KLONOPIN   Take 2 mg by mouth at bedtime.      divalproex 250 MG 24 hr tablet   Commonly known as: DEPAKOTE ER   Take 1 tablet (250 mg total) by mouth at bedtime.      insulin aspart 100 UNIT/ML injection   Commonly known as: novoLOG   Inject 15-20 Units into the skin 3 (three) times daily before meals. Inject approximately 15-20 units under skin as advised      insulin glargine 100 UNIT/ML injection   Commonly known as: LANTUS   Inject 6 Units into the skin daily.      metoprolol succinate 100 MG 24 hr tablet   Commonly known as: TOPROL-XL   Take 100 mg by mouth 4 (four) times a week. Do not take the night before dialysis. Dialysis on Tues, Thurs & Sat.      mycophenolate 250 MG capsule   Commonly known as: CELLCEPT   Take 250 mg by mouth 2 (two) times  daily.      pantoprazole 40 MG tablet   Commonly known as: PROTONIX   Take 40 mg by mouth daily.      predniSONE 5 MG tablet   Commonly known as: DELTASONE   Take 1 tablet by mouth daily.      tacrolimus 1 MG capsule   Commonly known as: PROGRAF   Take 1 mg by mouth 2 (two) times daily.      zolpidem 10 MG tablet   Commonly known as: AMBIEN   Take 10 mg by mouth at bedtime. For insomnia.           Follow-up Information    Schedule an appointment as soon as possible for a visit with Oliver Barre, MD.   Contact information:   [address]  [place] [address] [phone]           The results of significant diagnostics from this hospitalization (including imaging, microbiology, ancillary and laboratory) are listed below for reference.    Significant Diagnostic Studies: Ct Abdomen Pelvis Wo Contrast  06/10/2012  *RADIOLOGY REPORT*  Clinical Data: Nausea and vomiting for 2 days.  Elevated blood sugars.  Fevers.  CT ABDOMEN AND PELVIS WITHOUT CONTRAST  Technique:  Multidetector CT imaging of the abdomen and pelvis was performed following the standard protocol without intravenous contrast.  Comparison: 09/14/2010.  Findings: The small right pleural effusion.  Diffuse airspace infiltration noted in the lung bases.  Changes could represent edema or pneumonia.  Mild cardiac enlargement.  There appears to be a small esophageal hiatal hernia with opaque densities in the distal esophagus likely representing opaque medication.  Opaque medication also present in the stomach.  Surgical absence of the gallbladder.  The unenhanced appearance of the liver, spleen, pancreas, adrenal glands, and retroperitoneal lymph nodes is unremarkable. The kidneys are atrophic and appear to be a cyst filled.  No hydronephrosis.  Vascular calcifications. Calcification of the aorta without aneurysm.  The stomach, small bowel, and colon are not abnormally distended.  There are  postoperative changes in the right lower quadrant correlating with history of pancreatic transplantation.  There is mild diffuse thickening of the wall of the colon with hazy pericolonic infiltration and infiltration in the omentum.  Changes suggest diffuse colitis. No free air or free fluid in the abdomen.  Pelvis:  There is a small amount of free fluid in the pelvis.  The uterus and adnexal structures are not significantly enlarged. Rounded structure in the left pelvis measuring 2.5 x 3.3 cm likely represents transplant kidney and is decreasing in size since the previous study. No loculated pelvic fluid collections.  The appendix is normal.  No diverticulitis.  IMPRESSION: Diffuse colonic wall thickening and pericolonic infiltration suggesting pan colitis.  Small right pleural effusion.  Airspace infiltration in both lung bases.   Original Report Authenticated By: Burman Nieves, M.D.    Dg Chest 2 View  06/10/2012  *RADIOLOGY REPORT*  Clinical Data: Hypoxemia, dialysis patient  CHEST - 2 VIEW  Comparison: 06/09/2012  Findings: Left IJ dialysis catheter tips in the SVC RA junction. Stable cardiomegaly.  Resolving mild edema pattern throughout the lower lobes.  No current CHF or effusion.  No focal pneumonia, collapse consolidation.  Negative for pneumothorax.  Trachea is midline.  Prior cholecystectomy noted.  IMPRESSION: Resolving mild edema pattern.   Original Report Authenticated By: Judie Petit. Shick, M.D.    Dg Chest 2 View  06/09/2012  *RADIOLOGY REPORT*  Clinical Data: Cough, shortness of breath, history hypertension, diabetes, end-stage renal disease, asthma  CHEST - 2 VIEW  Comparison: 12/08/2011  Findings: Left jugular dual-lumen central venous catheter with distal tip projecting over cavoatrial junction. Enlargement of cardiac silhouette with pulmonary vascular congestion. Question minimal perihilar edema particularly on the right. No gross pleural effusion or pneumothorax. Bones diffusely demineralized.   IMPRESSION: Enlargement of cardiac silhouette with pulmonary vascular congestion and question minimal pulmonary edema/failure.   Original Report Authenticated By: Ulyses Southward, M.D.     Microbiology: Recent Results (from the past 240 hour(s))  MRSA PCR SCREENING     Status: Normal   Collection Time   06/09/12 10:45 PM      Component Value Range Status Comment   MRSA by PCR NEGATIVE  NEGATIVE Final  Labs: Basic Metabolic Panel:  Lab 06/12/12 1610 06/11/12 0720 06/10/12 0610 06/09/12 2015 06/09/12 1746 06/09/12 1350  NA 129* 131* 132* 132* -- 125*  K 4.9 5.2* 5.1 4.8 5.8* --  CL 89* 94* 91* 94* -- 88*  CO2 26 26 22  18* -- 20  GLUCOSE 323* 383* 195* 181* -- 854*  BUN 46* 31* 66* 64* -- 61*  CREATININE 7.95* 5.91* 9.26* 8.70* -- 8.14*  CALCIUM 10.3 9.2 10.2 9.2 -- 9.2  MG -- -- -- -- -- --  PHOS 4.0 -- -- -- -- --   Liver Function Tests:  Lab 06/12/12 0900 06/09/12 1350  AST -- 217*  ALT -- 89*  ALKPHOS -- 146*  BILITOT -- 0.3  PROT -- 7.1  ALBUMIN 3.3* 3.7   No results found for this basename: LIPASE:5,AMYLASE:5 in the last 168 hours No results found for this basename: AMMONIA:5 in the last 168 hours CBC:  Lab 06/12/12 0900 06/11/12 0720 06/10/12 0610 06/09/12 1350  WBC 5.6 5.6 7.5 9.2  NEUTROABS -- -- -- --  HGB 9.8* 9.9* 10.3* 11.4*  HCT 30.3* 31.3* 31.2* 36.6  MCV 101.0* 101.6* 99.0 102.2*  PLT 159 165 180 191   Cardiac Enzymes: No results found for this basename: CKTOTAL:5,CKMB:5,CKMBINDEX:5,TROPONINI:5 in the last 168 hours BNP: BNP (last 3 results) No results found for this basename: PROBNP:3 in the last 8760 hours CBG:  Lab 06/12/12 1259 06/12/12 0753 06/11/12 2111 06/11/12 1807 06/11/12 1716  GLUCAP 177* 322* 297* 91 30*       Signed:  Briellah Baik  [facility] 06/12/2012, 4:37 PM

## 2012-06-12 NOTE — Progress Notes (Signed)
Physical Therapy Treatment Patient Details Name: [name] MRN: [id number] DOB: 07/05/1970 Today's Date: 06/12/2012 Time: 9562-1308 PT Time Calculation (min): 13 min  PT Assessment / Plan / Recommendation Comments on Treatment Session  42 yo adm with N/V and hyperglycemia. Also with episodes of hypoglycemia this admission (including 1/15 with resulting fall). Mobilizing well today and may be able to d/c home without device if continues to improve.    Follow Up Recommendations  Home health PT;Supervision for mobility/OOB                 Equipment Recommendations  Rolling walker with 5" wheels        Frequency Min 3X/week   Plan Discharge plan remains appropriate;Frequency remains appropriate    Precautions / Restrictions Precautions Precautions: Fall   Pertinent Vitals/Pain Denied pain initially; once up walking, reported legs were sore due to lack of walking     Mobility  Bed Mobility Bed Mobility: Supine to Sit;Sitting - Scoot to Edge of Bed;Sit to Supine Supine to Sit: 6: Modified independent (Device/Increase time);HOB elevated Sitting - Scoot to Edge of Bed: 7: Independent Sit to Supine: 6: Modified independent (Device/Increase time);HOB elevated Transfers Transfers: Sit to Stand;Stand to Sit Sit to Stand: 4: Min guard;With upper extremity assist;From bed Stand to Sit: 4: Min guard;With upper extremity assist;To bed Details for Transfer Assistance: minguard for safety; recent fall and reporting she feels very weak post-dialysis (HD ended 2 hrs prior) Ambulation/Gait Ambulation/Gait Assistance: 4: Min assist Ambulation Distance (Feet): 200 Feet Assistive device: Other (Comment) (holding/pushing IV pole) Ambulation/Gait Assistance Details: pt without gross loss of balance; slightly unsteady initially, however reported she felt "stiff" from not being up; steadiness improved with incr distance Gait Pattern: Step-through pattern;Decreased stride length;Wide base of  support Gait velocity: decr        PT Goals Acute Rehab PT Goals Pt will go Sit to Stand: with modified independence PT Goal: Sit to Stand - Progress: Progressing toward goal Pt will go Stand to Sit: with modified independence PT Goal: Stand to Sit - Progress: Progressing toward goal Pt will Ambulate: >150 feet;with modified independence;with least restrictive assistive device PT Goal: Ambulate - Progress: Progressing toward goal  Visit Information  Last PT Received On: 06/12/12 Assistance Needed: +1    Subjective Data  Subjective: Pt reported she fell backwards yesterday and hit her head. She has no recall of event or why it happened (CBG 20 per RN note) Patient Stated Goal: Return home no use of RW   Cognition  Overall Cognitive Status: Appears within functional limits for tasks assessed/performed Arousal/Alertness: Awake/alert Orientation Level: Appears intact for tasks assessed Behavior During Session: [facility] for tasks performed Cognition - Other Comments: good awareness of need to call for assist and that her bed alarm needs to be on    Balance     End of Session PT - End of Session Equipment Utilized During Treatment: Gait belt Activity Tolerance: Patient tolerated treatment well Patient left: in bed;with call bell/phone within reach;with bed alarm set Nurse Communication: Mobility status   GP     [name] 06/12/2012, 3:28 PM Pager [phone]

## 2012-06-12 NOTE — Procedures (Signed)
I was present at this dialysis session. I have reviewed the session itself and made appropriate changes.   Rob Peyson Postema, MD [facility] 06/12/2012, 10:55 AM   

## 2012-06-19 ENCOUNTER — Ambulatory Visit: Payer: Medicare Other | Admitting: Internal Medicine

## 2012-06-20 ENCOUNTER — Encounter: Payer: Self-pay | Admitting: Internal Medicine

## 2012-06-23 ENCOUNTER — Telehealth: Payer: Self-pay | Admitting: Internal Medicine

## 2012-06-23 NOTE — Telephone Encounter (Signed)
Patient dismissed from [facility] by Carlus Pavlov, MD, effective 06/20/2012. Dismissal letter sent out by certified / registered mail. [email or web address]

## 2012-06-24 ENCOUNTER — Encounter: Payer: Self-pay | Admitting: Vascular Surgery

## 2012-06-25 ENCOUNTER — Encounter: Payer: Self-pay | Admitting: Vascular Surgery

## 2012-06-25 ENCOUNTER — Ambulatory Visit (INDEPENDENT_AMBULATORY_CARE_PROVIDER_SITE_OTHER): Payer: Medicare Other | Admitting: Vascular Surgery

## 2012-06-25 VITALS — BP 156/75 | HR 77 | Ht 61.0 in | Wt 163.9 lb

## 2012-06-25 DIAGNOSIS — T82898A Other specified complication of vascular prosthetic devices, implants and grafts, initial encounter: Secondary | ICD-10-CM

## 2012-06-25 DIAGNOSIS — N186 End stage renal disease: Secondary | ICD-10-CM

## 2012-06-25 NOTE — Progress Notes (Signed)
[facility]  Patient name: [name] MRN: [id number] DOB: 01/23/1971 Sex: female  REASON FOR VISIT: evaluate right thigh AV graft  HPI: [name] is a 42 y.o. female who had a right thigh AV graft placed on 03/14/2012. When initially began eating her graft she apparently had an infiltrate. She had ecchymosis around the graft which was associated with pain. I last saw her on 05/14/2012. The graft had an excellent thrill and I thought it would be reasonable to begin using the graft again in 2-3 weeks. She states that she continues to have pain in the graft when the attempt to use it for dialysis. She has a functioning catheter. She denies fever or chills.  REVIEW OF SYSTEMS: Arly.Keller ] denotes positive finding; [  ] denotes negative finding  CARDIOVASCULAR:  [ ]  chest pain   [ ]  dyspnea on exertion    CONSTITUTIONAL:  [ ]  fever   [ ]  chills  PHYSICAL EXAM: Filed Vitals:   06/25/12 1618  BP: 156/75  Pulse: 77  Height: 5\' 1"  (1.549 m)  Weight: 163 lb 14.4 oz (74.345 kg)  SpO2: 99%   Body mass index is 30.97 kg/(m^2). GENERAL: The patient is a well-nourished female, in no acute distress. The vital signs are documented above. CARDIOVASCULAR: There is a regular rate and rhythm  PULMONARY: There is good air exchange bilaterally without wheezing or rales. Her right thigh AV graft has an excellent thrill. The incisions are healed nicely. There is no significant warmth and no erythema to suggest an infection.  MEDICAL ISSUES: I do not see any reason why the graft should not function unless she has an early graft infection with poor incorporation. I have ordered a duplex scan to look for fluid around the graft. If there is fluid around the graft then we would have to consider exploration to rule out an infected graft. If there is no fluid around the graft, I would be reluctant to explore the graft and risk causing an infection if there were not and infection.I'll  make further recommendations pending the results of her duplex scan which I have arranged for this Friday, 06/27/2012.  Glynis Hunsucker S [facility] [place] [facility]: [phone]

## 2012-06-26 ENCOUNTER — Other Ambulatory Visit: Payer: Self-pay | Admitting: *Deleted

## 2012-06-26 DIAGNOSIS — Z4931 Encounter for adequacy testing for hemodialysis: Secondary | ICD-10-CM

## 2012-06-26 DIAGNOSIS — T82898A Other specified complication of vascular prosthetic devices, implants and grafts, initial encounter: Secondary | ICD-10-CM

## 2012-06-26 DIAGNOSIS — Z01810 Encounter for preprocedural cardiovascular examination: Secondary | ICD-10-CM

## 2012-06-30 ENCOUNTER — Encounter (INDEPENDENT_AMBULATORY_CARE_PROVIDER_SITE_OTHER): Payer: Medicare Other | Admitting: *Deleted

## 2012-06-30 DIAGNOSIS — T82898A Other specified complication of vascular prosthetic devices, implants and grafts, initial encounter: Secondary | ICD-10-CM

## 2012-06-30 DIAGNOSIS — Z4931 Encounter for adequacy testing for hemodialysis: Secondary | ICD-10-CM

## 2012-06-30 DIAGNOSIS — N184 Chronic kidney disease, stage 4 (severe): Secondary | ICD-10-CM

## 2012-06-30 DIAGNOSIS — Z48812 Encounter for surgical aftercare following surgery on the circulatory system: Secondary | ICD-10-CM

## 2012-07-02 ENCOUNTER — Encounter (HOSPITAL_COMMUNITY): Payer: Self-pay | Admitting: Family Medicine

## 2012-07-02 ENCOUNTER — Emergency Department (HOSPITAL_COMMUNITY): Payer: Medicare Other

## 2012-07-02 ENCOUNTER — Inpatient Hospital Stay (HOSPITAL_COMMUNITY)
Admission: EM | Admit: 2012-07-02 | Discharge: 2012-07-05 | DRG: 637 | Disposition: A | Discharge status: Home / Self Care | Payer: Medicare Other | Attending: Internal Medicine | Admitting: Internal Medicine

## 2012-07-02 DIAGNOSIS — F3289 Other specified depressive episodes: Secondary | ICD-10-CM | POA: Diagnosis present

## 2012-07-02 DIAGNOSIS — Y830 Surgical operation with transplant of whole organ as the cause of abnormal reaction of the patient, or of later complication, without mention of misadventure at the time of the procedure: Secondary | ICD-10-CM | POA: Diagnosis present

## 2012-07-02 DIAGNOSIS — I120 Hypertensive chronic kidney disease with stage 5 chronic kidney disease or end stage renal disease: Secondary | ICD-10-CM | POA: Diagnosis present

## 2012-07-02 DIAGNOSIS — E785 Hyperlipidemia, unspecified: Secondary | ICD-10-CM | POA: Diagnosis present

## 2012-07-02 DIAGNOSIS — D649 Anemia, unspecified: Secondary | ICD-10-CM

## 2012-07-02 DIAGNOSIS — G43909 Migraine, unspecified, not intractable, without status migrainosus: Secondary | ICD-10-CM | POA: Diagnosis present

## 2012-07-02 DIAGNOSIS — I517 Cardiomegaly: Secondary | ICD-10-CM

## 2012-07-02 DIAGNOSIS — R0602 Shortness of breath: Secondary | ICD-10-CM

## 2012-07-02 DIAGNOSIS — R51 Headache: Secondary | ICD-10-CM

## 2012-07-02 DIAGNOSIS — E870 Hyperosmolality and hypernatremia: Secondary | ICD-10-CM | POA: Diagnosis present

## 2012-07-02 DIAGNOSIS — J42 Unspecified chronic bronchitis: Secondary | ICD-10-CM | POA: Diagnosis present

## 2012-07-02 DIAGNOSIS — D638 Anemia in other chronic diseases classified elsewhere: Secondary | ICD-10-CM | POA: Diagnosis present

## 2012-07-02 DIAGNOSIS — Z7982 Long term (current) use of aspirin: Secondary | ICD-10-CM

## 2012-07-02 DIAGNOSIS — R079 Chest pain, unspecified: Secondary | ICD-10-CM

## 2012-07-02 DIAGNOSIS — E1029 Type 1 diabetes mellitus with other diabetic kidney complication: Secondary | ICD-10-CM | POA: Diagnosis present

## 2012-07-02 DIAGNOSIS — IMO0002 Reserved for concepts with insufficient information to code with codable children: Secondary | ICD-10-CM

## 2012-07-02 DIAGNOSIS — G2581 Restless legs syndrome: Secondary | ICD-10-CM | POA: Diagnosis present

## 2012-07-02 DIAGNOSIS — E1039 Type 1 diabetes mellitus with other diabetic ophthalmic complication: Secondary | ICD-10-CM | POA: Diagnosis present

## 2012-07-02 DIAGNOSIS — E669 Obesity, unspecified: Secondary | ICD-10-CM | POA: Diagnosis present

## 2012-07-02 DIAGNOSIS — K219 Gastro-esophageal reflux disease without esophagitis: Secondary | ICD-10-CM | POA: Diagnosis present

## 2012-07-02 DIAGNOSIS — J811 Chronic pulmonary edema: Secondary | ICD-10-CM | POA: Diagnosis present

## 2012-07-02 DIAGNOSIS — E1065 Type 1 diabetes mellitus with hyperglycemia: Secondary | ICD-10-CM | POA: Diagnosis present

## 2012-07-02 DIAGNOSIS — E11319 Type 2 diabetes mellitus with unspecified diabetic retinopathy without macular edema: Secondary | ICD-10-CM | POA: Diagnosis present

## 2012-07-02 DIAGNOSIS — F329 Major depressive disorder, single episode, unspecified: Secondary | ICD-10-CM | POA: Diagnosis present

## 2012-07-02 DIAGNOSIS — T86899 Unspecified complication of other transplanted tissue: Secondary | ICD-10-CM | POA: Diagnosis present

## 2012-07-02 DIAGNOSIS — N186 End stage renal disease: Secondary | ICD-10-CM | POA: Diagnosis present

## 2012-07-02 DIAGNOSIS — E875 Hyperkalemia: Secondary | ICD-10-CM | POA: Diagnosis not present

## 2012-07-02 DIAGNOSIS — E1010 Type 1 diabetes mellitus with ketoacidosis without coma: Principal | ICD-10-CM | POA: Diagnosis present

## 2012-07-02 DIAGNOSIS — Z794 Long term (current) use of insulin: Secondary | ICD-10-CM

## 2012-07-02 DIAGNOSIS — N2581 Secondary hyperparathyroidism of renal origin: Secondary | ICD-10-CM | POA: Diagnosis present

## 2012-07-02 DIAGNOSIS — Z992 Dependence on renal dialysis: Secondary | ICD-10-CM

## 2012-07-02 DIAGNOSIS — Z79899 Other long term (current) drug therapy: Secondary | ICD-10-CM

## 2012-07-02 DIAGNOSIS — E1110 Type 2 diabetes mellitus with ketoacidosis without coma: Secondary | ICD-10-CM

## 2012-07-02 DIAGNOSIS — E1069 Type 1 diabetes mellitus with other specified complication: Secondary | ICD-10-CM | POA: Diagnosis present

## 2012-07-02 DIAGNOSIS — T8610 Unspecified complication of kidney transplant: Secondary | ICD-10-CM | POA: Diagnosis present

## 2012-07-02 HISTORY — DX: Type 2 diabetes mellitus with ketoacidosis without coma: E11.10

## 2012-07-02 LAB — BASIC METABOLIC PANEL
Chloride: 98 mEq/L (ref 96–112)
Creatinine, Ser: 6.47 mg/dL — ABNORMAL HIGH (ref 0.50–1.10)
GFR calc Af Amer: 8 mL/min — ABNORMAL LOW (ref >90)
Potassium: 3.3 mEq/L — ABNORMAL LOW (ref 3.5–5.1)
Sodium: 135 mEq/L (ref 135–145)

## 2012-07-02 LAB — BASIC METABOLIC PANEL WITH GFR
BUN: 24 mg/dL — ABNORMAL HIGH (ref 6–23)
CO2: 23 meq/L (ref 19–32)
Calcium: 8.9 mg/dL (ref 8.4–10.5)
Chloride: 100 meq/L (ref 96–112)
Creatinine, Ser: 6.52 mg/dL — ABNORMAL HIGH (ref 0.50–1.10)
GFR calc Af Amer: 8 mL/min — ABNORMAL LOW
GFR calc non Af Amer: 7 mL/min — ABNORMAL LOW
Glucose, Bld: 88 mg/dL (ref 70–99)
Potassium: 3.5 meq/L (ref 3.5–5.1)
Sodium: 139 meq/L (ref 135–145)

## 2012-07-02 LAB — COMPREHENSIVE METABOLIC PANEL
Alkaline Phosphatase: 80 U/L (ref 39–117)
BUN: 22 mg/dL (ref 6–23)
CO2: 22 mEq/L (ref 19–32)
Calcium: 8.6 mg/dL (ref 8.4–10.5)
GFR calc Af Amer: 10 mL/min — ABNORMAL LOW (ref >90)
GFR calc non Af Amer: 8 mL/min — ABNORMAL LOW (ref >90)
Glucose, Bld: 720 mg/dL (ref 70–99)
Total Protein: 6.4 g/dL (ref 6.0–8.3)

## 2012-07-02 LAB — CBC
MCH: 32.4 pg (ref 26.0–34.0)
Platelets: 185 10*3/uL (ref 150–400)
RBC: 3.09 MIL/uL — ABNORMAL LOW (ref 3.87–5.11)
WBC: 6 10*3/uL (ref 4.0–10.5)

## 2012-07-02 LAB — HEPATIC FUNCTION PANEL
Alkaline Phosphatase: 79 U/L (ref 39–117)
Bilirubin, Direct: 0.1 mg/dL (ref 0.0–0.3)
Indirect Bilirubin: 0.3 mg/dL (ref 0.3–0.9)
Total Protein: 6.5 g/dL (ref 6.0–8.3)

## 2012-07-02 LAB — CBC WITH DIFFERENTIAL/PLATELET
Eosinophils Absolute: 0 10*3/uL (ref 0.0–0.7)
Eosinophils Relative: 0 % (ref 0–5)
HCT: 30.4 % — ABNORMAL LOW (ref 36.0–46.0)
Hemoglobin: 9.9 g/dL — ABNORMAL LOW (ref 12.0–15.0)
Lymphs Abs: 1 10*3/uL (ref 0.7–4.0)
MCH: 32.1 pg (ref 26.0–34.0)
MCV: 98.7 fL (ref 78.0–100.0)
Monocytes Relative: 4 % (ref 3–12)
RBC: 3.08 MIL/uL — ABNORMAL LOW (ref 3.87–5.11)

## 2012-07-02 LAB — POCT I-STAT 3, VENOUS BLOOD GAS (G3P V)
Acid-base deficit: 2 mmol/L (ref 0.0–2.0)
O2 Saturation: 77 %

## 2012-07-02 LAB — GLUCOSE, CAPILLARY
Glucose-Capillary: >600 mg/dL (ref 70–99)
Glucose-Capillary: >600 mg/dL (ref 70–99)
Glucose-Capillary: 490 mg/dL — ABNORMAL HIGH (ref 70–99)
Glucose-Capillary: 135 mg/dL — ABNORMAL HIGH (ref 70–99)
Glucose-Capillary: 54 mg/dL — ABNORMAL LOW (ref 70–99)
Glucose-Capillary: 164 mg/dL — ABNORMAL HIGH (ref 70–99)

## 2012-07-02 LAB — TROPONIN I
Troponin I: <0.3 ng/mL
Troponin I: <0.3 ng/mL

## 2012-07-02 LAB — POCT I-STAT TROPONIN I

## 2012-07-02 LAB — LIPASE, BLOOD: Lipase: 14 U/L (ref 11–59)

## 2012-07-02 MED ORDER — DEXTROSE-NACL 5-0.45 % IV SOLN: INTRAVENOUS | Status: DC

## 2012-07-02 MED ORDER — SODIUM CHLORIDE 0.9 % IJ SOLN: 3.0000 mL | Freq: Two times a day (BID) | INTRAMUSCULAR | Status: DC

## 2012-07-02 MED ORDER — SODIUM CHLORIDE 0.9 % IV SOLN
INTRAVENOUS | Status: DC
  Administered 2012-07-02: 13:00:00 via INTRAVENOUS
  Filled 2012-07-02: qty 1

## 2012-07-02 MED ORDER — ONDANSETRON HCL 4 MG PO TABS: 4.0000 mg | ORAL_TABLET | Freq: Four times a day (QID) | ORAL | Status: DC | PRN

## 2012-07-02 MED ORDER — ZOLPIDEM TARTRATE 5 MG PO TABS: 5.0000 mg | ORAL_TABLET | Freq: Every evening | ORAL | Status: DC | PRN

## 2012-07-02 MED ORDER — INSULIN ASPART 100 UNIT/ML ~~LOC~~ SOLN: 3.0000 [IU] | Freq: Three times a day (TID) | SUBCUTANEOUS | Status: DC

## 2012-07-02 MED ORDER — HYDROMORPHONE HCL PF 1 MG/ML IJ SOLN
0.5000 mg | Freq: Once | INTRAMUSCULAR | Status: AC
  Administered 2012-07-02: 0.5 mg via INTRAVENOUS
  Filled 2012-07-02: qty 1

## 2012-07-02 MED ORDER — ALBUTEROL SULFATE (5 MG/ML) 0.5% IN NEBU
2.5000 mg | INHALATION_SOLUTION | RESPIRATORY_TRACT | Status: DC | PRN
  Administered 2012-07-03: 01:00:00 2.5 mg via RESPIRATORY_TRACT

## 2012-07-02 MED ORDER — PREDNISONE 20 MG PO TABS
20.0000 mg | ORAL_TABLET | Freq: Every day | ORAL | Status: DC
  Administered 2012-07-02: 20 mg via ORAL
  Filled 2012-07-02 (×4): qty 1

## 2012-07-02 MED ORDER — IPRATROPIUM BROMIDE 0.02 % IN SOLN
0.5000 mg | Freq: Four times a day (QID) | RESPIRATORY_TRACT | Status: DC
Start: 2012-07-02 — End: 2012-07-02
  Administered 2012-07-02: 0.5 mg via RESPIRATORY_TRACT
  Filled 2012-07-02: qty 2.5

## 2012-07-02 MED ORDER — DEXTROSE 50 % IV SOLN
25.0000 mL | INTRAVENOUS | Status: DC | PRN
  Administered 2012-07-02: 25 mL via INTRAVENOUS
  Filled 2012-07-02 (×2): qty 50

## 2012-07-02 MED ORDER — ONDANSETRON HCL 4 MG/2ML IJ SOLN
4.0000 mg | Freq: Four times a day (QID) | INTRAMUSCULAR | Status: DC | PRN
  Administered 2012-07-03 – 2012-07-05 (×5): 4 mg via INTRAVENOUS
  Filled 2012-07-02 (×4): qty 2

## 2012-07-02 MED ORDER — DEXTROSE 5 % IV SOLN: INTRAVENOUS | Status: DC

## 2012-07-02 MED ORDER — PANTOPRAZOLE SODIUM 40 MG IV SOLR
40.0000 mg | Freq: Two times a day (BID) | INTRAVENOUS | Status: DC
  Administered 2012-07-03: 11:00:00 40 mg via INTRAVENOUS
  Filled 2012-07-02 (×4): qty 40

## 2012-07-02 MED ORDER — INSULIN ASPART 100 UNIT/ML ~~LOC~~ SOLN
0.0000 [IU] | Freq: Three times a day (TID) | SUBCUTANEOUS | Status: DC
  Administered 2012-07-03: 08:00:00 9 [IU] via SUBCUTANEOUS

## 2012-07-02 MED ORDER — HYDROXYZINE HCL 25 MG PO TABS
25.0000 mg | ORAL_TABLET | Freq: Once | ORAL | Status: AC
  Administered 2012-07-02: 25 mg via ORAL

## 2012-07-02 MED ORDER — ASPIRIN EC 81 MG PO TBEC
81.0000 mg | DELAYED_RELEASE_TABLET | Freq: Every day | ORAL | Status: DC
  Administered 2012-07-02 – 2012-07-05 (×4): 81 mg via ORAL
  Filled 2012-07-02 (×4): qty 1

## 2012-07-02 MED ORDER — DARBEPOETIN ALFA-POLYSORBATE 40 MCG/0.4ML IJ SOLN
40.0000 ug | INTRAMUSCULAR | Status: AC
  Administered 2012-07-02: 40 ug via INTRAVENOUS
  Filled 2012-07-02: qty 0.4

## 2012-07-02 MED ORDER — SODIUM CHLORIDE 0.9 % IJ SOLN: 3.0000 mL | INTRAMUSCULAR | Status: DC | PRN

## 2012-07-02 MED ORDER — ALBUTEROL SULFATE (5 MG/ML) 0.5% IN NEBU
2.5000 mg | INHALATION_SOLUTION | Freq: Three times a day (TID) | RESPIRATORY_TRACT | Status: DC
  Administered 2012-07-03 – 2012-07-05 (×6): 2.5 mg via RESPIRATORY_TRACT
  Filled 2012-07-02 (×7): qty 0.5

## 2012-07-02 MED ORDER — NONFORMULARY OR COMPOUNDED ITEM
50.0000 mg | Freq: Every day | Status: DC
  Filled 2012-07-02 (×2): qty 1

## 2012-07-02 MED ORDER — ALBUTEROL SULFATE (5 MG/ML) 0.5% IN NEBU
2.5000 mg | INHALATION_SOLUTION | Freq: Four times a day (QID) | RESPIRATORY_TRACT | Status: DC
  Administered 2012-07-02: 2.5 mg via RESPIRATORY_TRACT
  Filled 2012-07-02: qty 0.5

## 2012-07-02 MED ORDER — HEPARIN SODIUM (PORCINE) 5000 UNIT/ML IJ SOLN
5000.0000 [IU] | Freq: Three times a day (TID) | INTRAMUSCULAR | Status: DC
  Administered 2012-07-04: 5000 [IU] via SUBCUTANEOUS
  Filled 2012-07-02 (×11): qty 1

## 2012-07-02 MED ORDER — SODIUM CHLORIDE 0.9 % IV SOLN: Freq: Once | INTRAVENOUS | Status: DC

## 2012-07-02 MED ORDER — DOCUSATE SODIUM 100 MG PO CAPS
100.0000 mg | ORAL_CAPSULE | Freq: Two times a day (BID) | ORAL | Status: DC
  Administered 2012-07-03 – 2012-07-05 (×3): 100 mg via ORAL
  Filled 2012-07-02 (×7): qty 1

## 2012-07-02 MED ORDER — PREDNISONE 5 MG PO TABS: 5.0000 mg | ORAL_TABLET | Freq: Every day | ORAL | Status: DC

## 2012-07-02 MED ORDER — NONFORMULARY OR COMPOUNDED ITEM: 100.0000 mg | Status: DC

## 2012-07-02 MED ORDER — CALCIUM ACETATE 667 MG PO CAPS
2001.0000 mg | ORAL_CAPSULE | Freq: Three times a day (TID) | ORAL | Status: DC
  Administered 2012-07-03 – 2012-07-05 (×6): 2001 mg via ORAL
  Filled 2012-07-02 (×11): qty 4

## 2012-07-02 MED ORDER — HYDROXYZINE HCL 25 MG PO TABS
ORAL_TABLET | ORAL | Status: AC
  Administered 2012-07-02: 25 mg via ORAL
  Filled 2012-07-02: qty 1

## 2012-07-02 MED ORDER — METOPROLOL SUCCINATE ER 100 MG PO TB24
100.0000 mg | ORAL_TABLET | Freq: Every day | ORAL | Status: DC
  Administered 2012-07-03: 100 mg via ORAL
  Filled 2012-07-02 (×2): qty 1

## 2012-07-02 MED ORDER — DARBEPOETIN ALFA-POLYSORBATE 40 MCG/0.4ML IJ SOLN
INTRAMUSCULAR | Status: AC
  Administered 2012-07-02: 40 ug via INTRAVENOUS
  Filled 2012-07-02: qty 0.4

## 2012-07-02 MED ORDER — MYCOPHENOLATE MOFETIL 250 MG PO CAPS
250.0000 mg | ORAL_CAPSULE | Freq: Two times a day (BID) | ORAL | Status: DC
  Administered 2012-07-02 – 2012-07-05 (×6): 250 mg via ORAL
  Filled 2012-07-02 (×8): qty 1

## 2012-07-02 MED ORDER — INSULIN GLARGINE 100 UNIT/ML ~~LOC~~ SOLN
7.0000 [IU] | Freq: Two times a day (BID) | SUBCUTANEOUS | Status: DC
  Administered 2012-07-03: 01:00:00 7 [IU] via SUBCUTANEOUS

## 2012-07-02 MED ORDER — TACROLIMUS 1 MG PO CAPS
1.0000 mg | ORAL_CAPSULE | Freq: Two times a day (BID) | ORAL | Status: DC
  Administered 2012-07-02 – 2012-07-05 (×6): 1 mg via ORAL
  Filled 2012-07-02 (×8): qty 1

## 2012-07-02 MED ORDER — IPRATROPIUM BROMIDE 0.02 % IN SOLN
0.5000 mg | RESPIRATORY_TRACT | Status: DC | PRN
  Administered 2012-07-03: 01:00:00 0.5 mg via RESPIRATORY_TRACT

## 2012-07-02 MED ORDER — SODIUM CHLORIDE 0.9 % IV SOLN
INTRAVENOUS | Status: DC
  Administered 2012-07-02: 5.4 [IU]/h via INTRAVENOUS
  Filled 2012-07-02 (×2): qty 1

## 2012-07-02 MED ORDER — GUAIFENESIN ER 600 MG PO TB12
600.0000 mg | ORAL_TABLET | Freq: Two times a day (BID) | ORAL | Status: DC
  Administered 2012-07-03 – 2012-07-05 (×6): 600 mg via ORAL
  Filled 2012-07-02 (×7): qty 1

## 2012-07-02 MED ORDER — DOXERCALCIFEROL 4 MCG/2ML IV SOLN
3.0000 ug | INTRAVENOUS | Status: DC
  Administered 2012-07-04: 3 ug via INTRAVENOUS
  Filled 2012-07-02 (×2): qty 2

## 2012-07-02 MED ORDER — RENA-VITE PO TABS
1.0000 | ORAL_TABLET | Freq: Every day | ORAL | Status: DC
  Administered 2012-07-02 – 2012-07-04 (×3): 1 via ORAL
  Filled 2012-07-02 (×6): qty 1

## 2012-07-02 MED ORDER — IPRATROPIUM BROMIDE 0.02 % IN SOLN
0.5000 mg | Freq: Three times a day (TID) | RESPIRATORY_TRACT | Status: DC
  Administered 2012-07-03 – 2012-07-05 (×6): 0.5 mg via RESPIRATORY_TRACT
  Filled 2012-07-02 (×7): qty 2.5

## 2012-07-02 MED ORDER — METHYLPREDNISOLONE SODIUM SUCC 125 MG IJ SOLR
60.0000 mg | Freq: Two times a day (BID) | INTRAMUSCULAR | Status: DC
  Filled 2012-07-02 (×2): qty 0.96

## 2012-07-02 MED ORDER — METOPROLOL SUCCINATE ER 100 MG PO TB24: 100.0000 mg | ORAL_TABLET | ORAL | Status: DC

## 2012-07-02 MED ORDER — SODIUM CHLORIDE 0.9 % IV SOLN: 250.0000 mL | INTRAVENOUS | Status: DC | PRN

## 2012-07-02 MED ORDER — FENTANYL CITRATE 0.05 MG/ML IJ SOLN
25.0000 ug | INTRAMUSCULAR | Status: DC | PRN
Start: 2012-07-02 — End: 2012-07-03
  Administered 2012-07-02 – 2012-07-03 (×4): 25 ug via INTRAVENOUS
  Filled 2012-07-02 (×4): qty 2

## 2012-07-02 MED ORDER — CLONAZEPAM 0.5 MG PO TABS
1.0000 mg | ORAL_TABLET | Freq: Every day | ORAL | Status: DC
  Administered 2012-07-03 – 2012-07-04 (×4): 1 mg via ORAL
  Filled 2012-07-02: qty 1
  Filled 2012-07-02: qty 2
  Filled 2012-07-02 (×3): qty 1

## 2012-07-02 NOTE — H&P (Addendum)
[facility] History and Physical  [name] UJW:[id number] DOB: [birth year] DOA: 07/02/2012  Referring physician: Dr Rhunette Croft PCP: [name] Barre, MD  Specialists: [facility].   Chief Complaint: Chest pain, headache.   HPI: [name] is a 42 y.o. female with PMH Diabetes type 1, ESRD on dialysis, Chronic bronchitis who presents to ED complaining of headaches and chest pain that started day of admission. She is having her regular migraine. She is not able to tolerates Depakote. She is also complaining of chest pain  that started 1 week prior to admission. Pain is sharp in quality intermittent, worse with meals. She has been vomiting for last 3 days. No blood on vomit. She also relates mild worsening cough, wheezing, and SOB. She had dialysis day prior to admission. She completed her treatment.  She relates she has been using her insulin. Her dose was recently decrease to once a day during last admission.   Review of Systems: The patient denies anorexia, fever, weight loss,, vision loss, decreased hearing, hoarseness,syncope, , peripheral edema, balance deficits, hemoptysis, abdominal pain, melena, hematochezia, severe indigestion/heartburn, hematuria, incontinence, genital sores, muscle weakness, suspicious skin lesions, transient blindness, difficulty walking, depression, unusual weight change, abnormal bleeding,   Past Medical History  Diagnosis Date  . Hyperlipidemia   . Alopecia   . Obesity   . Iron deficiency   . 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 RLS (restless legs syndrome)   . Respiratory failure   . Injury of conjunctiva and corneal abrasion of right eye without foreign body   . Cellulitis   . Dialysis patient     Tues; Thurs; Sat; [place]  . Angina   . 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 Anemia   . Pneumonia 2012    "double"  . Blood transfusion  2004    "when I had my transplant"  . Migraine   . 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 Hyperparathyroidism, secondary   . Diabetic retinopathy(362.0)   . Hypertension     takes Metoprolol and Exforge daily  . Depression     takes Klonopin nightly  . Diabetes mellitus type 1     Pt states diagnosed at age 5 with prior episodes of DKA. S/p pancreatic transplant   . GERD (gastroesophageal reflux disease)     takes Protonix daily  . PONV (postoperative nausea and vomiting)   . Bronchitis   . Migraine   . Asthma   . Shortness of breath   . Emphysema of lung    Past Surgical History  Procedure Date  . 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 Thyroglobulin     x 7  . Av fistula placement     right upper arm  . Cholecystectomy 1995  .  hd graft placement/removal     "had 2 in my left upper arm"  . Eye surgery   . Retinopathy surgery   . Tooth extraction 10/10/11    X's two  . Insertion of dialysis catheter 12/08/2011    Procedure: INSERTION OF DIALYSIS CATHETER;  Surgeon: Chuck Hint, MD;  Location: [facility] OR;  Service: Vascular;  Laterality: Left;  . Av fistula placement 01/22/2012    Procedure: INSERTION OF ARTERIOVENOUS (AV) GORE-TEX GRAFT ARM;  Surgeon: Chuck Hint, MD;  Location: [facility] OR;  Service: Vascular;  Laterality: Left;  Ultrasound guided.  . Av fistula placement 03/14/2012    Procedure: INSERTION OF ARTERIOVENOUS (AV) GORE-TEX GRAFT THIGH;  Surgeon: Chuck Hint, MD;  Location: [facility] OR;  Service: Vascular;  Laterality: Right;   Social History:  reports that she has never smoked. She has never used smokeless tobacco. She reports that she does not drink alcohol or use illicit drugs.    Allergies  Allergen Reactions  . Morphine And Related Nausea And Vomiting and Other (See Comments)    "makes me delerirous"  . Penicillins Anaphylaxis  . Tramadol Nausea And Vomiting  . Vicodin  (Hydrocodone-Acetaminophen) Rash    Family History  Problem Relation Age of Onset  . Hypertension Mother   . Kidney disease Mother   . Diabetes Mother   . Hypertension Father   . Kidney disease Father   . Diabetes Father     Prior to Admission medications   Medication Sig Start Date End Date Taking? Authorizing Provider  albuterol (PROVENTIL HFA;VENTOLIN HFA) 108 (90 BASE) MCG/ACT inhaler Inhale 2 puffs into the lungs every 4 (four) hours as needed. For wheezing.  04/23/11  Yes Nyoka Cowden, MD  aspirin EC 81 MG tablet Take 81 mg by mouth daily.   Yes Historical Provider, MD  calcium acetate (PHOSLO) 667 MG capsule Take 2,001-2,668 mg by mouth 3 (three) times daily with meals. Dosage depends on meal consumed 03/12/11  Yes Historical Provider, MD  clonazePAM (KLONOPIN) 0.5 MG tablet Take 1 mg by mouth at bedtime.    Yes Historical Provider, MD  EXFORGE 10-160 MG per tablet Take 1 tablet by mouth Daily. 06/15/12  Yes Historical Provider, MD  insulin aspart (NOVOLOG) 100 UNIT/ML injection Inject 2-15 Units into the skin 3 (three) times daily before meals. Takes 6 units in the am the lunch and dinner dose are on a SS  2units-15 units 04/21/12  Yes Carlus Pavlov, MD  insulin glargine (LANTUS) 100 UNIT/ML injection Inject 6 Units into the skin daily. 06/12/12  Yes Sorin Luanne Bras, MD  metoprolol succinate (TOPROL-XL) 100 MG 24 hr tablet Take 100 mg by mouth 4 (four) times a week. Do not take the night before dialysis. Dialysis on Tues, Thurs & Sat. Pt takes on Tuesday, Thursday, Saturday and sunday   Yes Historical Provider, MD  mycophenolate (CELLCEPT) 250 MG capsule Take 250 mg by mouth 2 (two) times daily.   Yes Historical Provider, MD  pantoprazole (PROTONIX) 40 MG tablet Take 40 mg by mouth daily.   Yes Historical Provider, MD  predniSONE (DELTASONE) 5 MG tablet Take 1 tablet by mouth daily. 01/21/11  Yes Historical Provider, MD  tacrolimus (PROGRAF) 1 MG capsule Take 1 mg by mouth 2 (two)  times daily.   Yes Historical Provider, MD  zolpidem (AMBIEN) 10 MG tablet Take 10 mg by mouth at bedtime. For insomnia.   Yes Historical Provider, MD  divalproex (DEPAKOTE ER) 250 MG 24 hr tablet Take 1 tablet (250 mg total) by mouth at bedtime. 06/12/12   Sorin Luanne Bras, MD   Physical Exam: Filed Vitals:   07/02/12 1000 07/02/12 1004 07/02/12 1123 07/02/12 1130  BP: 163/77 148/77 153/76 150/73  Pulse: 75 77 77 78  Temp:  98.7 F (37.1 C) 98.7 F (37.1 C)   TempSrc:  Oral Oral   Resp: 17 22 18 28   SpO2: 97% 95% 100% 98%   General Appearance:    Alert, cooperative, no distress, appears stated age  Head:  Normocephalic, without obvious abnormality, atraumatic  Eyes:    PERRL, conjunctiva/corneas clear, EOM's intact,    Ears:    Normal TM's and external ear canals, both ears  Nose:   Nares normal, septum midline, mucosa normal, no drainage    or sinus tenderness  Throat:   Lips, mucosa, and tongue normal; teeth and gums normal  Neck:   Supple, symmetrical, trachea midline, no adenopathy;    thyroid:  no enlargement/tenderness/nodules; no carotid   bruit or JVD  Back:     Symmetric, no curvature, ROM normal, no CVA tenderness  Lungs:    bilateral wheezes, ronchus, , respirations unlabored  Chest Wall:    No tenderness or deformity   Heart:    Regular rate and rhythm, S1 and S2 normal, no murmur, rub   or gallop     Abdomen:     Soft, non-tender, bowel sounds active all four quadrants,    no masses, no organomegaly        Extremities:   Extremities normal, atraumatic, no cyanosis or edema  Pulses:   2+ and symmetric all extremities  Skin:   Skin color, texture, turgor normal, no rashes or lesions  Lymph nodes:   Cervical, supraclavicular, and axillary nodes normal  Neurologic:   CNII-XII intact, normal strength, sensation and reflexes    throughout      Labs on Admission:  Basic Metabolic Panel:  Lab 07/02/12 0981  NA 132*  K 4.0  CL 93*  CO2 22  GLUCOSE 720*  BUN 22   CREATININE 5.83*  CALCIUM 8.6  MG --  PHOS --   Liver Function Tests:  Lab 07/02/12 0835  AST 1516  ALT 1213  ALKPHOS 8079  BILITOT 0.40.4  PROT 6.46.5  ALBUMIN 3.4*3.4*   CBC:  Lab 07/02/12 0825  WBC 5.1  NEUTROABS 3.8  HGB 9.9*  HCT 30.4*  MCV 98.7  PLT 180   Cardiac Enzymes: No results found for this basename: CKTOTAL:5,CKMB:5,CKMBINDEX:5,TROPONINI:5 in the last 168 hours  BNP (last 3 results) No results found for this basename: PROBNP:3 in the last 8760 hours CBG:  Lab 07/02/12 1304 07/02/12 1122 07/02/12 0833  GLUCAP 490* >600* >600*    Radiological Exams on Admission: Dg Chest 2 View  07/02/2012  *RADIOLOGY REPORT*  Clinical Data: Dialysis patient with cough and shortness of breath.  CHEST - 2 VIEW  Comparison: 06/10/2012.  Findings: Left IJ dialysis catheters are unchanged near the SVC right atrial junction.  There is stable cardiomegaly.  There are recurrent basilar air space opacities associated with small bilateral pleural effusions.  The osseous structures appear unchanged.  Cholecystectomy clips are noted.  IMPRESSION: Recurrent basilar air space opacities and bilateral pleural effusions most consistent with recurrent congestive heart failure or volume overload.  Early infection not excluded.   Original Report Authenticated By: Carey Bullocks, M.D.     EKG: Independently reviewed. Sinus rhythm.   Assessment/Plan Active Problems:  DKA, type 1  DM (diabetes mellitus), type 1 with renal complications  End stage renal disease  1-DKA: Patient with CBG more than 600, PH at 7.3, gap 17. Will admit to step down unit. Continue with insulin Gtt. Every 2 hour B-met. Hold on IV fluids due to concern for pulmonary edema. Will transition to Lantus BID when gap close. Work up for infection.   2-Chest pain: probably GI in nature. Esophagitis, gastritis. Protonix BID. LFT normal. Will order RUQ [place] check for gallstone. Will order lipase.  3-Pulmonary Edema/  Dyspnea:  Volume overload in patient with ESRD on dialysis. Nephrologist consulted already by ED physician. Will order ECHO evaluate for hearth failure.  4-Chronic bronchitis: She relates history of chronic bronchitis. No history of smoking. She has wheezing lung exam. I will start solumedrol. She follow up with Dr Sherene Sires outpatient. Increase protonix to avoid GERD. Wheezing could be related to hearth failure. I will hold on starting antibiotics at this time. No fevers, no leukocytosis.  5-Anemia of chronic disease: Will defer to renal start IV iron, aranesp. 6-Migraine: Fentanyl PRN.   Code Status: Full Code.  Family Communication: Care discussed with patient.  Disposition Plan: expect 3 days inpatient.   Time spent: more than 70 minutes.  Pate Aylward [facility] Pager [phone]  If 7PM-7AM, please contact night-coverage [email or web address] Password [email or web address] 07/02/2012, 1:07 PM   CBG decrease, will hold on insulin Gtt. Increase gap could be also related to renal failure. Will start lantus BID.

## 2012-07-02 NOTE — Consult Note (Signed)
[facility] Renal Consultation Note    Indication for Consultation:  Management of ESRD/hemodialysis; anemia, hypertension/volume and secondary hyperparathyroidism  HPI: [name] is a 42 y.o. female ESRD patient (TTS HD) with a history of DM type I and chronic bronchitis who presented to the ED complaining of HA, intermittent chest pain that worsens with meals, cough, wheezing, SOB and vomiting x 3 days.  She was admitted 1/13-1/16 for very similar symptoms as well as hyperosmolar hyperglycemic syndrome with extreme episodes of hypoglycemia. Her insulin regimen was adjusted at that time however her BG today is > 600.  She states that she never fully recovered, still has a migraine and wheezing has not improved because she never got the nebulizer machine she was promised. She is s/p failed kidney and pancreas transplants and on chronic immunosuppression.  She is resting comfortably in bed, on O2 with some noticeable work of breathing.  She endorses a migraine HA and abdominal pain, but denies any dizziness, vision changes, nausea, vomiting, hematemesis, diarrhea, melena or hematochezia. Last hemodialysis was yesterday as scheduled.    Past Medical History  Diagnosis Date  . Hyperlipidemia   . Alopecia   . Obesity   . Iron deficiency   . 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 RLS (restless legs syndrome)   . Respiratory failure   . Injury of conjunctiva and corneal abrasion of right eye without foreign body   . Cellulitis   . Dialysis patient     Tues; Thurs; Sat; [place]  . Angina   . 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 Anemia   . Pneumonia 2012    "double"  . Blood transfusion 2004    "when I had my transplant"  . Migraine   . 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 Hyperparathyroidism, secondary   . Diabetic  retinopathy(362.0)   . Hypertension     takes Metoprolol and Exforge daily  . Depression     takes Klonopin nightly  . Diabetes mellitus type 1     Pt states diagnosed at age 89 with prior episodes of DKA. S/p pancreatic transplant   . GERD (gastroesophageal reflux disease)     takes Protonix daily  . PONV (postoperative nausea and vomiting)   . Bronchitis   . Migraine   . Asthma   . Shortness of breath   . Emphysema of lung   . DKA (diabetic ketoacidoses) 07/02/2012   Past Surgical History  Procedure Date  . 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 Thyroglobulin     x 7  . Av fistula placement     right upper arm  . Cholecystectomy 1995  .  hd graft placement/removal     "had 2 in my left upper arm"  . Eye surgery   . Retinopathy surgery   . Tooth extraction 10/10/11    X's two  . Insertion of dialysis catheter 12/08/2011    Procedure: INSERTION OF DIALYSIS CATHETER;  Surgeon: Chuck Hint, MD;  Location: [facility] OR;  Service: Vascular;  Laterality: Left;  . Av fistula placement 01/22/2012    Procedure: INSERTION OF ARTERIOVENOUS (AV) GORE-TEX GRAFT ARM;  Surgeon: Chuck Hint, MD;  Location: [facility] OR;  Service: Vascular;  Laterality: Left;  Ultrasound guided.  . Av fistula placement 03/14/2012  Procedure: INSERTION OF ARTERIOVENOUS (AV) GORE-TEX GRAFT THIGH;  Surgeon: Chuck Hint, MD;  Location: [facility] OR;  Service: Vascular;  Laterality: Right;   Family History  Problem Relation Age of Onset  . Hypertension Mother   . Kidney disease Mother   . Diabetes Mother   . Hypertension Father   . Kidney disease Father   . Diabetes Father    Social History:  reports that she has never smoked. She has never used smokeless tobacco. She reports that she does not drink alcohol or use illicit drugs. Allergies  Allergen Reactions  . Morphine And Related Nausea And Vomiting and Other (See Comments)    "makes me delerirous"  .  Penicillins Anaphylaxis  . Tramadol Nausea And Vomiting  . Vicodin (Hydrocodone-Acetaminophen) Rash   Prior to Admission medications   Medication Sig Start Date End Date Taking? Authorizing Provider  albuterol (PROVENTIL HFA;VENTOLIN HFA) 108 (90 BASE) MCG/ACT inhaler Inhale 2 puffs into the lungs every 4 (four) hours as needed. For wheezing.  04/23/11  Yes Nyoka Cowden, MD  aspirin EC 81 MG tablet Take 81 mg by mouth daily.   Yes Historical Provider, MD  calcium acetate (PHOSLO) 667 MG capsule Take 2,001-2,668 mg by mouth 3 (three) times daily with meals. Dosage depends on meal consumed 03/12/11  Yes Historical Provider, MD  clonazePAM (KLONOPIN) 0.5 MG tablet Take 1 mg by mouth at bedtime.    Yes Historical Provider, MD  EXFORGE 10-160 MG per tablet Take 1 tablet by mouth Daily. 06/15/12  Yes Historical Provider, MD  insulin aspart (NOVOLOG) 100 UNIT/ML injection Inject 2-15 Units into the skin 3 (three) times daily before meals. Takes 6 units in the am the lunch and dinner dose are on a SS  2units-15 units 04/21/12  Yes Carlus Pavlov, MD  insulin glargine (LANTUS) 100 UNIT/ML injection Inject 6 Units into the skin daily. 06/12/12  Yes Sorin Luanne Bras, MD  metoprolol succinate (TOPROL-XL) 100 MG 24 hr tablet Take 100 mg by mouth 4 (four) times a week. Do not take the night before dialysis. Dialysis on Tues, Thurs & Sat. Pt takes on Tuesday, Thursday, Saturday and sunday   Yes Historical Provider, MD  mycophenolate (CELLCEPT) 250 MG capsule Take 250 mg by mouth 2 (two) times daily.   Yes Historical Provider, MD  pantoprazole (PROTONIX) 40 MG tablet Take 40 mg by mouth daily.   Yes Historical Provider, MD  predniSONE (DELTASONE) 5 MG tablet Take 1 tablet by mouth daily. 01/21/11  Yes Historical Provider, MD  tacrolimus (PROGRAF) 1 MG capsule Take 1 mg by mouth 2 (two) times daily.   Yes Historical Provider, MD  zolpidem (AMBIEN) 10 MG tablet Take 10 mg by mouth at bedtime. For insomnia.   Yes  Historical Provider, MD  divalproex (DEPAKOTE ER) 250 MG 24 hr tablet Take 1 tablet (250 mg total) by mouth at bedtime. 06/12/12   Sorin Luanne Bras, MD   Current Facility-Administered Medications  Medication Dose Route Frequency Provider Last Rate Last Dose  . albuterol (PROVENTIL) (5 MG/ML) 0.5% nebulizer solution 2.5 mg  2.5 mg Nebulization Q6H Belkys A Regalado, MD      . aspirin EC tablet 81 mg  81 mg Oral Daily Belkys A Regalado, MD      . calcium acetate (PHOSLO) capsule 2,001-2,668 mg  2,001-2,668 mg Oral TID WC Belkys A Regalado, MD      . clonazePAM (KLONOPIN) tablet 1 mg  1 mg Oral QHS Belkys A Regalado, MD      .  dextrose 50 % solution 25 mL  25 mL Intravenous PRN Belkys A Regalado, MD      . docusate sodium (COLACE) capsule 100 mg  100 mg Oral BID Belkys A Regalado, MD      . fentaNYL (SUBLIMAZE) injection 25 mcg  25 mcg Intravenous Q2H PRN Belkys A Regalado, MD      . guaiFENesin (MUCINEX) 12 hr tablet 600 mg  600 mg Oral BID Belkys A Regalado, MD      . heparin injection 5,000 Units  5,000 Units Subcutaneous Q8H Belkys A Regalado, MD      . insulin regular (NOVOLIN R,HUMULIN R) 1 Units/mL in sodium chloride 0.9 % 100 mL infusion   Intravenous Continuous Belkys A Regalado, MD      . ipratropium (ATROVENT) nebulizer solution 0.5 mg  0.5 mg Nebulization Q6H Belkys A Regalado, MD      . methylPREDNISolone sodium succinate (SOLU-MEDROL) 125 mg/2 mL injection 60 mg  60 mg Intravenous Q12H Belkys A Regalado, MD      . multivitamin (RENA-VIT) tablet 1 tablet  1 tablet Oral Daily [facility] Alyne Martinson, MD      . mycophenolate (CELLCEPT) capsule 250 mg  250 mg Oral BID Belkys A Regalado, MD      . NONFORMULARY OR COMPOUNDED ITEM 50 mg  50 mg Oral QHS [facility] Neveyah Garzon, MD      . ondansetron (ZOFRAN) tablet 4 mg  4 mg Oral Q6H PRN Belkys A Regalado, MD       Or  . ondansetron (ZOFRAN) injection 4 mg  4 mg Intravenous Q6H PRN Belkys A Regalado, MD      . pantoprazole (PROTONIX) injection 40 mg  40 mg  Intravenous Q12H Belkys A Regalado, MD      . tacrolimus (PROGRAF) capsule 1 mg  1 mg Oral BID Belkys A Regalado, MD      . zolpidem (AMBIEN) tablet 5 mg  5 mg Oral QHS PRN Alba Cory, MD       Labs: Basic Metabolic Panel:  Lab 07/02/12 1610  NA 132*  K 4.0  CL 93*  CO2 22  GLUCOSE 720*  BUN 22  CREATININE 5.83*  CALCIUM 8.6  ALB --  PHOS --   Liver Function Tests:  Lab 07/02/12 0835  AST 1516  ALT 1213  ALKPHOS 8079  BILITOT 0.40.4  PROT 6.46.5  ALBUMIN 3.4*3.4*   CBC:  Lab 07/02/12 0825  WBC 5.1  NEUTROABS 3.8  HGB 9.9*  HCT 30.4*  MCV 98.7  PLT 180   CBG:  Lab 07/02/12 1526 07/02/12 1420 07/02/12 1304 07/02/12 1122 07/02/12 0833  GLUCAP 192* 344* 490* >600* >600*   IStudies/Results: Dg Chest 2 View  07/02/2012  *RADIOLOGY REPORT*  Clinical Data: Dialysis patient with cough and shortness of breath.  CHEST - 2 VIEW  Comparison: 06/10/2012.  Findings: Left IJ dialysis catheters are unchanged near the SVC right atrial junction.  There is stable cardiomegaly.  There are recurrent basilar air space opacities associated with small bilateral pleural effusions.  The osseous structures appear unchanged.  Cholecystectomy clips are noted.  IMPRESSION: Recurrent basilar air space opacities and bilateral pleural effusions most consistent with recurrent congestive heart failure or volume overload.  Early infection not excluded.   Original Report Authenticated By: Carey Bullocks, M.D.     ROS: (+) HA, SOB, Wheezing, Abdominal pain. 10-pt ROS asked and answered.  All other systems negative except as discussed in the HPI above.   Physical  Exam: Filed Vitals:   07/02/12 1000 07/02/12 1004 07/02/12 1123 07/02/12 1130  BP: 163/77 148/77 153/76 150/73  Pulse: 75 77 77 78  Temp:  98.7 F (37.1 C) 98.7 F (37.1 C)   TempSrc:  Oral Oral   Resp: 17 22 18 28   SpO2: 97% 95% 100% 98%     General: Well developed, well nourished, in no acute distress. Head:  Normocephalic, atraumatic, sclera non-icteric, mucus membranes are moist. ? Facial edema Neck: Supple. JVD not elevated.     Lungs: Diffuse wheezes, noticeably short of breath when speaking. No rales or rhonchi. Rales in bases and diminished bs Heart: RRR with S1 S2. No murmurs, rubs, or gallops appreciated. Abdomen: Soft, non-tender, non-distended with normoactive bowel sounds. No rebound/guarding. No obvious abdominal masses. Obese, liver down 4 cm M-S:  Strength and tone appear normal for age. Lower extremities:without edema or ischemic changes, no open wounds AVG R groin Neuro: Alert and oriented X 3. Moves all extremities spontaneously. Psych:  Responds to questions appropriately with a normal affect. Dialysis Access: Lt I-J catheter.  Rt thigh AVG with + bruit placed 03/14/12 by Dr. Edilia Bo - poorly functioning  ? Issues, appears deep.  Dialysis Orders: Center: [facility]  on TTS EDW 71.5 kg HD Bath 2K/ 2.5Ca  Optiflux 160 Time 3:45 Heparin 5000 . Access Lt I-J/ Poorly functioning Rt thigh AVG placed 03/14/12 by Edilia Bo. BFR 400 DFR A1.5  Hectorol 3 mcg IV/HD Epogen 1400   Units IV/HD  Venofer  0   Assessment/Plan: 1. Diabetic Hyperosmolar syndrome-  CBG > 600. Ph 7.3 and gap of 17. Admitted to stepdown for insulin mgmt and infection workup per admit.No evidence of DKA. Has DM hyper osmolar syndrome. Not doing appropriate monitoring or adjusting of insulin and glucose, has been instructed in pas 2. Pulmonary Edema/ Dyspnea  - secondary to volume overload.  Echo pending per admit 3. Chest Pain - Likely of GI etiology per admit. RUQ [place] pending.  Protonix BID 4. Chronic Bronchitis - Solumedrol per primary.  Afebrile, no leukocytosis. Wheezing possibly due to HF. Echo pending. 5. ESRD -  TTS at [facility]. Full treatment on Tuesday. K+ 4.0 Signif vol xs on xray and exam, will do HD and lower wgt. Access deep to exam, get Dr. Adele Dan note 6. Hypertension/volume  - SBPs 150s-160s.  Restart Home med Toprol XL  100 mg QHS. Currenty about 3.5 kg above EDW with SOB. CXR shows recurrent basilar airspace opacities and pleural effusions consistent with CHF or volume excess. HD today. UF goal 4.5 L Needs less meds with extra vol off. 7. Anemia  - Hgb 9.9 on op Epo.Tsat% 40 and Ferritin 1372 on 1/30.  No IV Fe for now. Aranesp 40 ordered. 8. Metabolic bone disease -  Ca 8.6 ( 9.1 corrected.) Phos 4.9. PTH 191.5. Continue Phoslo, Hectorol 3 mcg 9. Nutrition - albumin 3.4. High protein renal diet, renal vitamin 10. History of failed kidney and pancreas transplants. - Pt's pancreas Tx failed in 2011 according to patient, kidney Tx failed in 2008. She states that she has been maintained on low doses of Cellcept, Prograf and pred by her transplant MD at [facility] (Dr. Resa Miner) to avoid rejection of current organs while she is on waiting list for another KP transplant.  Need to slowly taper as no purpose at this time 11. Migraine - Fentanyl PRN per primary.  Claud Kelp, PA-C [facility] [phone] pager 07/02/2012, 3:54 PM  I have seen and examined this patient  and agree with the plan of care with changes noted.  HyperOsm syndrome.  Needs vol off and adjustment of meds. .  Meliza Kage L 07/02/2012, 5:01 PM

## 2012-07-02 NOTE — Progress Notes (Signed)
Utilization Review Completed Kishon Garriga J. Gennett Garcia, RN, BSN, NCM [phone]  

## 2012-07-02 NOTE — ED Provider Notes (Signed)
History     CSN: [id number]  Arrival date & time 07/02/12  0748   First MD Initiated Contact with Patient 07/02/12 [phone]      Chief Complaint  Patient presents with  . Cough  . Chest Pain    (Consider location/radiation/quality/duration/timing/severity/associated sxs/prior treatment) HPI Comments: [name] is a 42 y.o. 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  Patient states she gets dialysis Tuesday, Thursday and Saturday. She states she had dialysis yesterday without complications. She was admitted for similar symptoms on 06/09/12.  She states her blood sugar has been running high since she was discharged from the hospital on 06/12/12 and she has been using a sliding scale in an effort to get it to come down but it has been consistently in the 400s.   She reports that during her last admission her dose of Lantus was changed from twice a day to once a day.  She does not have a Primary Care Physician.  She only sees her Nephrologist.  Associated symptoms include cough, shortness of breath.  Nothing makes her SOB better. She has tried using her albuterol inhaler, which does not help.   Shortness of breath worse with exertion.   Pt denies fever, chills, headache, neck pain, diarrhea, weakness, dizziness, swelling of her hands or feet, syncope.  She has had nausea and vomiting for the past 4 days.  She had some generalized abdominal pain last evening, but no abdominal pain at this time.  She also began having chest pain last evening.  Pain substernal and does not radiate.  Pain has been constant since last evening.  Pain worse with palpation.  She does not have any prior cardiac history.  Patient does not make urine.     Patient is a 42 y.o. female presenting with cough  and chest pain. The history is provided by the patient and medical records.  Cough Associated symptoms include chest pain and shortness of breath. Pertinent negatives include no chills.  Chest Pain Primary symptoms include shortness of breath, cough, abdominal pain, nausea and vomiting. Pertinent negatives for primary symptoms include no fever.     Past Medical History  Diagnosis Date  . Hyperlipidemia   . Alopecia   . Obesity   . Iron deficiency   . 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 RLS (restless legs syndrome)   . Respiratory failure   . Injury of conjunctiva and corneal abrasion of right eye without foreign body   . Cellulitis   . Dialysis patient     Tues; Thurs; Sat; [place]  . Angina   . 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 Anemia   . Pneumonia 2012    "double"  . Blood transfusion 2004    "when I had my transplant"  . Migraine   . 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 Hyperparathyroidism, secondary   . Diabetic retinopathy(362.0)   . Hypertension     takes Metoprolol and Exforge daily  . Depression     takes Klonopin nightly  . Diabetes mellitus type 1     Pt states diagnosed at age 88 with prior episodes of DKA. S/p pancreatic  transplant   . GERD (gastroesophageal reflux disease)     takes Protonix daily  . PONV (postoperative nausea and vomiting)   . Bronchitis   . Migraine   . Asthma   . Shortness of breath   . Emphysema of lung     Past Surgical History  Procedure Date  . 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 Thyroglobulin     x 7  . Av fistula placement     right upper arm  . Cholecystectomy 1995  .  hd graft placement/removal     "had 2 in my left upper arm"  . Eye surgery   . Retinopathy surgery   . Tooth extraction 10/10/11    X's two  . Insertion of dialysis  catheter 12/08/2011    Procedure: INSERTION OF DIALYSIS CATHETER;  Surgeon: Chuck Hint, MD;  Location[facility] OR;  Service: Vascular;  Laterality: Left;  . Av fistula placement 01/22/2012    Procedure: INSERTION OF ARTERIOVENOUS (AV) GORE-TEX GRAFT ARM;  Surgeon: Chuck Hint, MD;  Location: [facility] OR;  Service: Vascular;  Laterality: Left;  Ultrasound guided.  . Av fistula placement 03/14/2012    Procedure: INSERTION OF ARTERIOVENOUS (AV) GORE-TEX GRAFT THIGH;  Surgeon: Chuck Hint, MD;  Location: [facility] OR;  Service: Vascular;  Laterality: Right;    Family History  Problem Relation Age of Onset  . Hypertension Mother   . Kidney disease Mother   . Diabetes Mother   . Hypertension Father   . Kidney disease Father   . Diabetes Father     History  Substance Use Topics  . Smoking status: Never Smoker   . Smokeless tobacco: Never Used  . Alcohol Use: No    OB History    Grav Para Term Preterm Abortions TAB SAB Ect Mult Living                  Review of Systems  Constitutional: Negative for fever and chills.  Respiratory: Positive for cough and shortness of breath.   Cardiovascular: Positive for chest pain.  Gastrointestinal: Positive for nausea, vomiting and abdominal pain.  Genitourinary:       Patient does not produce urine  All other systems reviewed and are negative.    Allergies  Morphine and related; Tramadol; and Vicodin  Home Medications   Current Outpatient Rx  Name  Route  Sig  Dispense  Refill  . ALBUTEROL SULFATE HFA 108 (90 BASE) MCG/ACT IN AERS   Inhalation   Inhale 2 puffs into the lungs every 4 (four) hours as needed. For wheezing.          . ASPIRIN EC 81 MG PO TBEC   Oral   Take 81 mg by mouth daily.         Marland Kitchen CALCIUM ACETATE 667 MG PO CAPS   Oral   Take 2,001-2,668 mg by mouth 3 (three) times daily with meals. Dosage depends on meal consumed         . CLONAZEPAM 0.5 MG PO TABS   Oral   Take 2 mg by mouth at bedtime.           Marland Kitchen DIVALPROEX SODIUM ER 250 MG PO TB24   Oral   Take 1 tablet (250 mg total) by mouth at bedtime.   30 tablet   0   . INSULIN ASPART 100 UNIT/ML [place] SOLN   Subcutaneous   Inject 15-20 Units into the skin  3 (three) times daily before meals. Inject approximately 15-20 units under skin as advised         . INSULIN GLARGINE 100 UNIT/ML [place] SOLN   Subcutaneous   Inject 6 Units into the skin daily.   10 mL   0   . METOPROLOL SUCCINATE ER 100 MG PO TB24   Oral   Take 100 mg by mouth 4 (four) times a week. Do not take the night before dialysis. Dialysis on Tues, Thurs & Sat.         Marland Kitchen MYCOPHENOLATE MOFETIL 250 MG PO CAPS   Oral   Take 250 mg by mouth 2 (two) times daily.         Marland Kitchen PANTOPRAZOLE SODIUM 40 MG PO TBEC   Oral   Take 40 mg by mouth daily.         Marland Kitchen PREDNISONE 5 MG PO TABS   Oral   Take 1 tablet by mouth daily.         Marland Kitchen TACROLIMUS 1 MG PO CAPS   Oral   Take 1 mg by mouth 2 (two) times daily.         Marland Kitchen ZOLPIDEM TARTRATE 10 MG PO TABS   Oral   Take 10 mg by mouth at bedtime. For insomnia.           BP 165/76  Pulse 78  Temp 98.8 F (37.1 C) (Oral)  Resp 22  SpO2 100%  LMP 06/14/2012  Physical Exam  Nursing note and vitals reviewed. Constitutional: She appears well-developed and well-nourished. No distress.  HENT:  Head: Normocephalic and atraumatic.  Mouth/Throat: Oropharynx is clear and moist.  Neck: Normal range of motion. Neck supple.  Cardiovascular: Normal rate, regular rhythm, normal heart sounds and intact distal pulses.   Pulmonary/Chest: Effort normal. No respiratory distress. She has no wheezes. She has rales. She exhibits tenderness.  Abdominal: Soft. Bowel sounds are normal. She exhibits no distension and no mass. There is no tenderness. There is no rebound and no guarding.  Musculoskeletal: Normal range of motion. She exhibits no edema.       No lower extremity edema  Neurological: She is alert.  Skin: Skin is warm  and dry. She is not diaphoretic.  Psychiatric: She has a normal mood and affect.    ED Course  Procedures (including critical care time)  Labs Reviewed  GLUCOSE, CAPILLARY - Abnormal; Notable for the following:    Glucose-Capillary >600 (*)     All other components within normal limits  CBC WITH DIFFERENTIAL  COMPREHENSIVE METABOLIC PANEL   Dg Chest 2 View  07/02/2012  *RADIOLOGY REPORT*  Clinical Data: Dialysis patient with cough and shortness of breath.  CHEST - 2 VIEW  Comparison: 06/10/2012.  Findings: Left IJ dialysis catheters are unchanged near the SVC right atrial junction.  There is stable cardiomegaly.  There are recurrent basilar air space opacities associated with small bilateral pleural effusions.  The osseous structures appear unchanged.  Cholecystectomy clips are noted.  IMPRESSION: Recurrent basilar air space opacities and bilateral pleural effusions most consistent with recurrent congestive heart failure or volume overload.  Early infection not excluded.   Original Report Authenticated By: Carey Bullocks, M.D.      No diagnosis found.   Date: 07/02/2012  Rate: 80  Rhythm: normal sinus rhythm  QRS Axis: normal  Intervals: normal  ST/T Wave abnormalities: normal  Conduction Disutrbances:none  Narrative Interpretation:   Old EKG Reviewed: unchanged  CRITICAL CARE Performed by:  VAN WINGEN, Hendry Speas   Total critical care time: 30 minutes  Critical care time was exclusive of separately billable procedures and treating other patients.  Critical care was necessary to treat or prevent imminent or life-threatening deterioration.  Critical care was time spent personally by me on the following activities: development of treatment plan with patient and/or surrogate as well as nursing, discussions with consultants, evaluation of patient's response to treatment, examination of patient, obtaining history from patient or surrogate, ordering and performing treatments and  interventions, ordering and review of laboratory studies, ordering and review of radiographic studies, pulse oximetry and re-evaluation of patient's condition.  Consulted [facility] Dr. Sunnie Nielsen.  She has agreed to admit the patient.  She is requesting that Nephrology be consulted.      Consulted with [facility].  He states that if the Hospitalist would like him to be consulted he would like them to consult them.  MDM  Patient with a history of ESRD presenting with several complaints including chest pain, SOB, elevated blood glucose, nausea, and vomiting.  CBG found to be greater than 600 upon arrival in the ED.  Patient with an elevated anion gap of 17 and with a pH of 7.335 on VBG.  Patient started on Glucostablizer.  Patient also found to be in volume overload on CXR.  Last dialysis was yesterday.  Nephrology has been consulted.   She is also complaining of chest pain.  No ischemic changes on EKG.  Initial Troponin negative.  Patient admitted to Hospitalist service for further management of volume overload, chest pain, and elevated blood sugar.        Pascal Lux [place], PA-C 07/02/12 2112

## 2012-07-02 NOTE — ED Notes (Signed)
Pt chest pain and cough x 2 days. Hurts with palpation. Pt hx of bronchitis. CBG reading critical high. sts also migraine. BP 180/80. 80 pulse. 20 RR.

## 2012-07-02 NOTE — ED Provider Notes (Signed)
Medical screening examination/treatment/procedure(s) were conducted as a shared visit with non-physician practitioner(s) and myself.  I personally evaluated the patient during the encounter.  Mild anion gap acidosis with elevated blood sugars. She is also having midsternal chest pain, and worsening dyspnea on exertion. No ACS workup so far - and she is diabetic and a vasculopath with her ESRD.  It appears that patient is in DKA. Will hydrate gently and start insulin. No underlying cause based on hx alone- CXR ordered to r.o infiltrate. ACS again is possible etiology.  Angiocath insertion Performed by: Derwood Kaplan  Consent: Verbal consent obtained. Risks and benefits: risks, benefits and alternatives were discussed Time out: Immediately prior to procedure a "time out" was called to verify the correct patient, procedure, equipment, support staff and site/side marked as required.  Preparation: Patient was prepped and draped in the usual sterile fashion.  Vein Location: right IJ  Ultrasound Guided  Gauge: 20  Normal blood return and flush without difficulty Patient tolerance: Patient tolerated the procedure well with no immediate complications.      Derwood Kaplan, MD 07/02/12 1123

## 2012-07-02 NOTE — ED Notes (Signed)
Provided pt with a diet sprite

## 2012-07-02 NOTE — ED Notes (Signed)
Spoke with IV team due unsuccessful attempts at IV x 3 by 2 different RN's.  Dr. Rhunette Croft notified of delay in initiating IV insulin.  Pt condition unchanged (stable).  A/ox 3 no distress noted.

## 2012-07-02 NOTE — ED Notes (Signed)
IV team in at Bedside for IV evaluation/attempt.

## 2012-07-03 ENCOUNTER — Inpatient Hospital Stay (HOSPITAL_COMMUNITY): Payer: Medicare Other

## 2012-07-03 DIAGNOSIS — E1110 Type 2 diabetes mellitus with ketoacidosis without coma: Secondary | ICD-10-CM

## 2012-07-03 DIAGNOSIS — N058 Unspecified nephritic syndrome with other morphologic changes: Secondary | ICD-10-CM

## 2012-07-03 DIAGNOSIS — R079 Chest pain, unspecified: Secondary | ICD-10-CM

## 2012-07-03 DIAGNOSIS — N186 End stage renal disease: Secondary | ICD-10-CM

## 2012-07-03 DIAGNOSIS — E1165 Type 2 diabetes mellitus with hyperglycemia: Secondary | ICD-10-CM

## 2012-07-03 DIAGNOSIS — E1129 Type 2 diabetes mellitus with other diabetic kidney complication: Secondary | ICD-10-CM

## 2012-07-03 DIAGNOSIS — E1029 Type 1 diabetes mellitus with other diabetic kidney complication: Secondary | ICD-10-CM

## 2012-07-03 DIAGNOSIS — G43909 Migraine, unspecified, not intractable, without status migrainosus: Secondary | ICD-10-CM

## 2012-07-03 LAB — BASIC METABOLIC PANEL
BUN: 14 mg/dL (ref 6–23)
BUN: 18 mg/dL (ref 6–23)
CO2: 26 mEq/L (ref 19–32)
Calcium: 7.8 mg/dL — ABNORMAL LOW (ref 8.4–10.5)
Calcium: 8.1 mg/dL — ABNORMAL LOW (ref 8.4–10.5)
Chloride: 89 mEq/L — ABNORMAL LOW (ref 96–112)
Chloride: 94 mEq/L — ABNORMAL LOW (ref 96–112)
Creatinine, Ser: 3.72 mg/dL — ABNORMAL HIGH (ref 0.50–1.10)
Creatinine, Ser: 4.01 mg/dL — ABNORMAL HIGH (ref 0.50–1.10)
GFR calc Af Amer: 19 mL/min — ABNORMAL LOW (ref >90)
GFR calc Af Amer: 17 mL/min — ABNORMAL LOW (ref >90)
GFR calc non Af Amer: 16 mL/min — ABNORMAL LOW (ref >90)
GFR calc non Af Amer: 13 mL/min — ABNORMAL LOW (ref >90)
Glucose, Bld: 639 mg/dL (ref 70–99)
Glucose, Bld: 274 mg/dL — ABNORMAL HIGH (ref 70–99)
Potassium: 6.4 mEq/L (ref 3.5–5.1)
Sodium: 126 mEq/L — ABNORMAL LOW (ref 135–145)

## 2012-07-03 LAB — CBC
HCT: 31.2 % — ABNORMAL LOW (ref 36.0–46.0)
Hemoglobin: 10.3 g/dL — ABNORMAL LOW (ref 12.0–15.0)
MCH: 32.7 pg (ref 26.0–34.0)
MCHC: 33 g/dL (ref 30.0–36.0)
RBC: 3.15 MIL/uL — ABNORMAL LOW (ref 3.87–5.11)

## 2012-07-03 LAB — HEPATITIS B SURFACE ANTIGEN: Hepatitis B Surface Ag: NEGATIVE

## 2012-07-03 LAB — RENAL FUNCTION PANEL
Albumin: 3.6 g/dL (ref 3.5–5.2)
BUN: 20 mg/dL (ref 6–23)
Calcium: 8.8 mg/dL (ref 8.4–10.5)
Creatinine, Ser: 4.27 mg/dL — ABNORMAL HIGH (ref 0.50–1.10)
Glucose, Bld: 97 mg/dL (ref 70–99)
Phosphorus: 3.3 mg/dL (ref 2.3–4.6)

## 2012-07-03 LAB — GLUCOSE, CAPILLARY
Glucose-Capillary: 171 mg/dL — ABNORMAL HIGH (ref 70–99)
Glucose-Capillary: 460 mg/dL — ABNORMAL HIGH (ref 70–99)
Glucose-Capillary: 578 mg/dL (ref 70–99)
Glucose-Capillary: 91 mg/dL (ref 70–99)

## 2012-07-03 LAB — TROPONIN I: Troponin I: <0.3 ng/mL (ref <0.30)

## 2012-07-03 MED ORDER — INSULIN ASPART 100 UNIT/ML ~~LOC~~ SOLN: 0.0000 [IU] | Freq: Three times a day (TID) | SUBCUTANEOUS | Status: DC

## 2012-07-03 MED ORDER — INSULIN GLARGINE 100 UNIT/ML ~~LOC~~ SOLN
7.0000 [IU] | Freq: Two times a day (BID) | SUBCUTANEOUS | Status: DC
  Administered 2012-07-03 – 2012-07-05 (×4): 7 [IU] via SUBCUTANEOUS

## 2012-07-03 MED ORDER — INSULIN ASPART 100 UNIT/ML ~~LOC~~ SOLN
7.0000 [IU] | Freq: Once | SUBCUTANEOUS | Status: AC
  Administered 2012-07-03: 7 [IU] via SUBCUTANEOUS

## 2012-07-03 MED ORDER — LIVING WELL WITH DIABETES BOOK
Freq: Once | Status: AC
  Administered 2012-07-03: 15:00:00 1
  Filled 2012-07-03: qty 1

## 2012-07-03 MED ORDER — SODIUM CHLORIDE 0.45 % IV SOLN: INTRAVENOUS | Status: DC

## 2012-07-03 MED ORDER — HEPARIN SODIUM (PORCINE) 1000 UNIT/ML DIALYSIS
1000.0000 [IU] | INTRAMUSCULAR | Status: DC | PRN
  Filled 2012-07-03: qty 1

## 2012-07-03 MED ORDER — DEXTROSE 50 % IV SOLN: 25.0000 mL | INTRAVENOUS | Status: DC | PRN

## 2012-07-03 MED ORDER — DEXTROSE 5 % IV SOLN
INTRAVENOUS | Status: DC
  Administered 2012-07-03: 30 mL via INTRAVENOUS

## 2012-07-03 MED ORDER — DEXTROSE-NACL 5-0.45 % IV SOLN: INTRAVENOUS | Status: DC

## 2012-07-03 MED ORDER — SODIUM CHLORIDE 0.9 % IV SOLN: 100.0000 mL | INTRAVENOUS | Status: DC | PRN

## 2012-07-03 MED ORDER — INSULIN GLARGINE 100 UNIT/ML ~~LOC~~ SOLN: 7.0000 [IU] | Freq: Two times a day (BID) | SUBCUTANEOUS | Status: DC

## 2012-07-03 MED ORDER — LIDOCAINE HCL (PF) 1 % IJ SOLN: 5.0000 mL | INTRAMUSCULAR | Status: DC | PRN

## 2012-07-03 MED ORDER — HEPARIN SODIUM (PORCINE) 1000 UNIT/ML DIALYSIS
100.0000 [IU]/kg | INTRAMUSCULAR | Status: DC | PRN
  Filled 2012-07-03: qty 7

## 2012-07-03 MED ORDER — SODIUM CHLORIDE 0.9 % IV SOLN
INTRAVENOUS | Status: DC
  Administered 2012-07-03: 4 [IU]/h via INTRAVENOUS
  Filled 2012-07-03: qty 1

## 2012-07-03 MED ORDER — NEPRO/CARBSTEADY PO LIQD
237.0000 mL | ORAL | Status: DC | PRN
  Filled 2012-07-03: qty 237

## 2012-07-03 MED ORDER — LIDOCAINE-PRILOCAINE 2.5-2.5 % EX CREA: 1.0000 "application " | TOPICAL_CREAM | CUTANEOUS | Status: DC | PRN

## 2012-07-03 MED ORDER — HYDROMORPHONE HCL PF 1 MG/ML IJ SOLN
1.0000 mg | INTRAMUSCULAR | Status: DC | PRN
  Administered 2012-07-03 – 2012-07-05 (×10): 1 mg via INTRAVENOUS
  Filled 2012-07-03 (×10): qty 1

## 2012-07-03 MED ORDER — PROMETHAZINE HCL 25 MG/ML IJ SOLN: 12.5000 mg | Freq: Four times a day (QID) | INTRAMUSCULAR | Status: DC | PRN

## 2012-07-03 MED ORDER — METOPROLOL SUCCINATE ER 50 MG PO TB24
50.0000 mg | ORAL_TABLET | Freq: Every day | ORAL | Status: DC
  Administered 2012-07-03 – 2012-07-04 (×2): 50 mg via ORAL
  Filled 2012-07-03 (×3): qty 1

## 2012-07-03 MED ORDER — PANTOPRAZOLE SODIUM 40 MG PO TBEC
40.0000 mg | DELAYED_RELEASE_TABLET | Freq: Two times a day (BID) | ORAL | Status: DC
  Administered 2012-07-03 – 2012-07-04 (×4): 40 mg via ORAL
  Filled 2012-07-03 (×3): qty 1

## 2012-07-03 MED ORDER — ALTEPLASE 2 MG IJ SOLR: 2.0000 mg | Freq: Once | INTRAMUSCULAR | Status: AC | PRN

## 2012-07-03 MED ORDER — PREDNISONE 5 MG PO TABS
5.0000 mg | ORAL_TABLET | Freq: Every day | ORAL | Status: DC
  Administered 2012-07-03 – 2012-07-05 (×3): 5 mg via ORAL
  Filled 2012-07-03 (×4): qty 1

## 2012-07-03 MED ORDER — PENTAFLUOROPROP-TETRAFLUOROETH EX AERO: 1.0000 "application " | INHALATION_SPRAY | CUTANEOUS | Status: DC | PRN

## 2012-07-03 NOTE — Progress Notes (Addendum)
[facility] PROGRESS NOTE  [name] AVW:[id number] DOB: 1970-08-01 DOA: 07/02/2012 PCP: Oliver Barre, MD  Assessment/Plan:  1-DKA/Hyperosmolar state: gap at 6, bicarb at 57. Will start again insulin Gtt. B-met Q 2 hours.   2-Chest pain: probably GI in nature. Esophagitis, gastritis. Protonix BID. LFT normal. RUQ [place] check for gallstone pending.  Lipase 14. Chest pain improved.   3-Pulmonary Edema/ Dyspnea: Volume overload in patient with ESRD on dialysis.  ECHO: Left ventricle: The cavity size was normal. Wall thickness was normal. Systolic function was normal. The estimated ejection fraction was in the range of 60% to 65%. Wall motion was normal; there were no regional wall motion Abnormalities. - Left atrium: The atrium was mildly dilated. - Pulmonary arteries: Systolic pressure was mildly increased. -Continue with dialysis treatments.   4-Chronic bronchitis: She relates history of chronic bronchitis. No history of smoking. She follow up with Dr Sherene Sires outpatient. Increase protonix to avoid GERD. Wheezing could be related to hearth failure. I will hold on starting antibiotics at this time. No fevers, no leukocytosis. No more wheezes on lung exam. Change prednisone to home dose.  5-Anemia of chronic disease: Will defer to renal start IV iron, aranesp.  6-Migraine: Fentanyl not helping. dilaudid has help in the pass.  7-Hyperkalemia: EKG, start insulin gtt. Will inform renal.    Code Status: Full Code.  Family Communication: Care discussed with patient.  Disposition Plan: Remain step down unit.    Consultants:  Nephrologist.  Procedures:  [place] pending  Antibiotics:  None.   HPI/Subjective: Relates chest pain better. Still with headaches.   Objective: Filed Vitals:   07/03/12 0100 07/03/12 0450 07/03/12 0705 07/03/12 0730  BP: 130/63   153/75  Pulse: 75   76  Temp:  97.8 F (36.6 C) 97.7 F (36.5 C)   TempSrc:  Oral Oral   Resp: 18   20  Height:       Weight:      SpO2: 99% 100%  97%    Intake/Output Summary (Last 24 hours) at 07/03/12 0949 Last data filed at 07/03/12 0023  Gross per 24 hour  Intake      0 ml  Output   3000 ml  Net  -3000 ml   Filed Weights   07/02/12 1950 07/02/12 2000 07/03/12 0023  Weight: 74 kg (163 lb 2.3 oz) 74 kg (163 lb 2.3 oz) 70.2 kg (154 lb 12.2 oz)    Exam:   General:  No distress.  Cardiovascular: S 1, S 2 RRR  Respiratory: no wheezes, sounds better  Abdomen: Bs present, soft, nt  Data Reviewed: Basic Metabolic Panel:  Lab 07/03/12 8295 07/02/12 1647 07/02/12 1543 07/02/12 0835  NA 126* 139 135 132*  K 6.4* 3.5 3.3* 4.0  CL 89* 100 98 93*  CO2 18* 23 21 22   GLUCOSE 639* 88 172* 720*  BUN 14 24* 24* 22  CREATININE 3.36* 6.52* 6.47* 5.83*  CALCIUM 7.8* 8.9 8.6 8.6  MG -- -- -- --  PHOS -- -- -- --   Liver Function Tests:  Lab 07/02/12 0835  AST 1516  ALT 1213  ALKPHOS 8079  BILITOT 0.40.4  PROT 6.46.5  ALBUMIN 3.4*3.4*    Lab 07/02/12 1543  LIPASE 14  AMYLASE --   No results found for this basename: AMMONIA:5 in the last 168 hours CBC:  Lab 07/03/12 0716 07/02/12 1543 07/02/12 0825  WBC 6.8 6.0 5.1  NEUTROABS -- -- 3.8  HGB 10.3* 10.0* 9.9*  HCT  31.2* 30.0* 30.4*  MCV 99.0 97.1 98.7  PLT 185 185 180   Cardiac Enzymes:  Lab 07/03/12 0716 07/02/12 2030 07/02/12 1543  CKTOTAL -- -- --  CKMB -- -- --  CKMBINDEX -- -- --  TROPONINI <0.30 <0.30 <0.30   BNP (last 3 results) No results found for this basename: PROBNP:3 in the last 8760 hours CBG:  Lab 07/03/12 0818 07/03/12 0731 07/03/12 0043 07/02/12 1939 07/02/12 1828  GLUCAP 552* 578* 171* 164* 135*    Recent Results (from the past 240 hour(s))  CULTURE, BLOOD (ROUTINE X 2)     Status: Normal (Preliminary result)   Collection Time   07/02/12  4:50 PM      Component Value Range Status Comment   Specimen Description BLOOD RIGHT HAND   Final    Special Requests BOTTLES DRAWN AEROBIC ONLY [facility]   Final     Culture  Setup Time 07/02/2012 23:33   Final    Culture     Final    Value:        BLOOD CULTURE RECEIVED NO GROWTH TO DATE CULTURE WILL BE HELD FOR 5 DAYS BEFORE ISSUING A FINAL NEGATIVE REPORT   Report Status PENDING   Incomplete      Studies: Dg Chest 2 View  07/02/2012  *RADIOLOGY REPORT*  Clinical Data: Dialysis patient with cough and shortness of breath.  CHEST - 2 VIEW  Comparison: 06/10/2012.  Findings: Left IJ dialysis catheters are unchanged near the SVC right atrial junction.  There is stable cardiomegaly.  There are recurrent basilar air space opacities associated with small bilateral pleural effusions.  The osseous structures appear unchanged.  Cholecystectomy clips are noted.  IMPRESSION: Recurrent basilar air space opacities and bilateral pleural effusions most consistent with recurrent congestive heart failure or volume overload.  Early infection not excluded.   Original Report Authenticated By: Carey Bullocks, M.D.     Scheduled Meds:   . albuterol  2.5 mg Nebulization TID  . aspirin EC  81 mg Oral Daily  . calcium acetate  2,001-2,668 mg Oral TID WC  . clonazePAM  1 mg Oral QHS  . docusate sodium  100 mg Oral BID  . doxercalciferol  3 mcg Intravenous Q T,Th,Sa-HD  . guaiFENesin  600 mg Oral BID  . heparin  5,000 Units Subcutaneous Q8H  . ipratropium  0.5 mg Nebulization TID  . living well with diabetes book   Does not apply Once  . metoprolol succinate  100 mg Oral QHS  . multivitamin  1 tablet Oral Daily  . mycophenolate  250 mg Oral BID  . NONFORMULARY OR COMPOUNDED ITEM 50 mg  50 mg Oral QHS  . pantoprazole (PROTONIX) IV  40 mg Intravenous Q12H  . predniSONE  5 mg Oral Q breakfast  . tacrolimus  1 mg Oral BID   Continuous Infusions:   . dextrose 5 % and 0.45% NaCl    . insulin (NOVOLIN-R) infusion      Active Problems:  DM (diabetes mellitus), type 1 with renal complications    Time spent: 30 minutes.     [name]  [facility] Pager [phone]. If 8PM-8AM, please contact night-coverage at [email or web address], password [facility] 07/03/2012, 9:49 AM  LOS: 1 day

## 2012-07-03 NOTE — Progress Notes (Addendum)
Inpatient Diabetes Program Recommendations  AACE/ADA: New Consensus Statement on Inpatient Glycemic Control (2013)  Target Ranges:  Prepandial:   less than 140 mg/dL      Peak postprandial:   less than 180 mg/dL (1-2 hours)      Critically ill patients:  140 - 180 mg/dL   Reason for Visit: Results for SELYNA, KLAHN (MRN [id number]) as of 07/03/2012 11:22  Ref. Range 07/03/2012 00:43 07/03/2012 07:31 07/03/2012 08:18 07/03/2012 09:53  Glucose-Capillary Latest Range: 70-99 mg/dL 409 (H) 811 (HH) 914 (HH) 460 (H)   Note patient back on on insulin drip.  CBG's dropped yesterday while on the drip.   Lantus was not given prior to discontinuation of drip.  At midnight last night CBG was 171 mg/dL and patient received Lantus 7 units.  By this morning CBG was up to 578 mg/dL and NW2=95.  Patient received 9 units of Novolog at 0750 and insulin drip started at 1022 am.  Spoke to patient.  She states that on last admission her insulin was changed to Lantus 6 units daily.  She stated she was concerned that once a day Lantus was not enough. She states that in the past Lantus 7 units twice a day worked best for her.  Patient will need to get Lantus prior to discontinuation of insulin drip.  Asked RN to call MD when CBG's are less than 250 mg/dL to get order for Dextrose in IV fluids.    1500 RN reports that patient has had some nausea/vomitting today however she is now hungry.  CBG was down to 82 mg/dL on last check.  Encouraged RN to monitor patient very closely due to labile CBG's.  Recommend checking CBG's tid wc, HS, and at 3 AM.  Will follow.

## 2012-07-03 NOTE — Progress Notes (Signed)
VASCULAR PROGRESS NOTE  SUBJECTIVE: no complaints. She states that her right thigh AV graft has not been tender. She denies fever or chills.  PHYSICAL EXAM: Filed Vitals:   07/03/12 0100 07/03/12 0450 07/03/12 0705 07/03/12 0730  BP: 130/63   153/75  Pulse: 75   76  Temp:  97.8 F (36.6 C) 97.7 F (36.5 C)   TempSrc:  Oral Oral   Resp: 18   20  Height:      Weight:      SpO2: 99% 100%  97%   Her right thigh AV graft has an excellent thrill. It is not pulsatile. She has a palpable right dorsalis pedis pulse. Her incisions are healed. There is no erythema or drainage on the right side.  LABS: Lab Results  Component Value Date   WBC 6.8 07/03/2012   HGB 10.3* 07/03/2012   HCT 31.2* 07/03/2012   MCV 99.0 07/03/2012   PLT 185 07/03/2012   Lab Results  Component Value Date   CREATININE 4.01* 07/03/2012   Lab Results  Component Value Date   INR 1.06 01/27/2010   CBG (last 3)   Basename 07/03/12 1328 07/03/12 1232 07/03/12 1123  GLUCAP 82 147* 292*    Active Problems:  DM (diabetes mellitus), type 1 with renal complications  I reviewed her duplex scan of her right thigh AV graft which was done in our office on 06/30/2012. This did not show any fluid around the graft. There were some elevated velocities at the venous anastomosis.  ASSESSMENT AND PLAN: 1. Currently, I do not see any problems with her right thigh AV graft. When they initially began using her right thigh AV graft it sounds like she had an infiltrate a she did have significant ecchymosis around the graft. This seems to have resolved. I think it would be reasonable to attempt cannulation of her thigh graft. Hopefully by this time the graft isn't incorporated and she has absorbed hematoma from her infiltrate. Again, the duplex did not show any fluid around the graft. The graft is not pulsatile to suggest that the elevated velocities seen on the duplex at the venous anastomosis are significant. Therefore I would only consider a  fistulogram and venous angioplasty if she had problems with her flow rates on dialysis.   Cari Caraway Beeper: [phone] 07/03/2012

## 2012-07-03 NOTE — Progress Notes (Signed)
Subjective: Interval History: has complaints tired of waiting on people.  Objective: Vital signs in last 24 hours: Temp:  [97.1 F (36.2 C)-98.7 F (37.1 C)] 97.7 F (36.5 C) (02/06 0705) Pulse Rate:  [69-78] 76  (02/06 0730) Resp:  [0-72] 20  (02/06 0730) BP: (100-154)/(59-76) 153/75 mmHg (02/06 0730) SpO2:  [95 %-100 %] 97 % (02/06 0730) FiO2 (%):  [100 %] 100 % (02/06 0824) Weight:  [70.2 kg (154 lb 12.2 oz)-74 kg (163 lb 2.3 oz)] 70.2 kg (154 lb 12.2 oz) (02/06 0023) Weight change:   Intake/Output from previous day: 02/05 0701 - 02/06 0700 In: -  Out: 3000  Intake/Output this shift:    General appearance: cooperative and moderately obese Resp: diminished breath sounds bilaterally Cardio: S1, S2 normal and systolic murmur: holosystolic 2/6, blowing at apex GI: obese, pos bs, soft Extremities: edema tr and avg R groin  Lab Results:  [facility] 07/03/12 0716 07/02/12 1543  WBC 6.8 6.0  HGB 10.3* 10.0*  HCT 31.2* 30.0*  PLT 185 185   BMET:  Basename 07/03/12 0716 07/02/12 1647  NA 126* 139  K 6.4* 3.5  CL 89* 100  CO2 18* 23  GLUCOSE 639* 88  BUN 14 24*  CREATININE 3.36* 6.52*  CALCIUM 7.8* 8.9   No results found for this basename: PTH:2 in the last 72 hours Iron Studies: No results found for this basename: IRON,TIBC,TRANSFERRIN,FERRITIN in the last 72 hours  Studies/Results: Dg Chest 2 View  07/02/2012  *RADIOLOGY REPORT*  Clinical Data: Dialysis patient with cough and shortness of breath.  CHEST - 2 VIEW  Comparison: 06/10/2012.  Findings: Left IJ dialysis catheters are unchanged near the SVC right atrial junction.  There is stable cardiomegaly.  There are recurrent basilar air space opacities associated with small bilateral pleural effusions.  The osseous structures appear unchanged.  Cholecystectomy clips are noted.  IMPRESSION: Recurrent basilar air space opacities and bilateral pleural effusions most consistent with recurrent congestive heart failure or volume  overload.  Early infection not excluded.   Original Report Authenticated By: Carey Bullocks, M.D.     I have reviewed the patient's current medications.  Assessment/Plan: 1 esrd finished HD late, K^ secondary to ^^glu.   Needs glu control 2 Anemia on epo 3 HTn better with lower vol 4 DM per primary 5 Access get [facility] records 6 HPTH P HD, epo, insulin    LOS: 1 day   [name] 07/03/2012,10:06 AM

## 2012-07-03 NOTE — ED Provider Notes (Signed)
Medical screening examination/treatment/procedure(s) were conducted as a shared visit with non-physician practitioner(s) and myself.  I personally evaluated the patient during the encounter.  Angiocath insertion Performed by: Derwood Kaplan  Consent: Verbal consent obtained. Risks and benefits: risks, benefits and alternatives were discussed Time out: Immediately prior to procedure a "time out" was called to verify the correct patient, procedure, equipment, support staff and site/side marked as required.  Preparation: Patient was prepped and draped in the usual sterile fashion.  Vein Location: right IJ  Ultrasound Guided  Gauge: 20  Normal blood return and flush without difficulty Patient tolerance: Patient tolerated the procedure well with no immediate complications.    Derwood Kaplan, MD 07/03/12 [phone]

## 2012-07-04 DIAGNOSIS — E1110 Type 2 diabetes mellitus with ketoacidosis without coma: Secondary | ICD-10-CM

## 2012-07-04 DIAGNOSIS — R51 Headache: Secondary | ICD-10-CM

## 2012-07-04 LAB — COMPREHENSIVE METABOLIC PANEL
ALT: 12 U/L (ref 0–35)
Alkaline Phosphatase: 74 U/L (ref 39–117)
BUN: 22 mg/dL (ref 6–23)
CO2: 25 mEq/L (ref 19–32)
Chloride: 93 mEq/L — ABNORMAL LOW (ref 96–112)
GFR calc Af Amer: 11 mL/min — ABNORMAL LOW (ref >90)
GFR calc non Af Amer: 9 mL/min — ABNORMAL LOW (ref >90)
Glucose, Bld: 142 mg/dL — ABNORMAL HIGH (ref 70–99)
Potassium: 3.5 mEq/L (ref 3.5–5.1)
Sodium: 133 mEq/L — ABNORMAL LOW (ref 135–145)
Total Bilirubin: 0.3 mg/dL (ref 0.3–1.2)
Total Protein: 6.8 g/dL (ref 6.0–8.3)

## 2012-07-04 LAB — RENAL FUNCTION PANEL
CO2: 27 mEq/L (ref 19–32)
Calcium: 9.8 mg/dL (ref 8.4–10.5)
Chloride: 92 mEq/L — ABNORMAL LOW (ref 96–112)
GFR calc Af Amer: 10 mL/min — ABNORMAL LOW (ref >90)
GFR calc non Af Amer: 9 mL/min — ABNORMAL LOW (ref >90)
Glucose, Bld: 86 mg/dL (ref 70–99)
Potassium: 3.6 mEq/L (ref 3.5–5.1)
Sodium: 135 mEq/L (ref 135–145)

## 2012-07-04 LAB — CBC
HCT: 32.3 % — ABNORMAL LOW (ref 36.0–46.0)
MCHC: 32.8 g/dL (ref 30.0–36.0)
Platelets: 187 10*3/uL (ref 150–400)
RDW: 14 % (ref 11.5–15.5)
WBC: 9.5 10*3/uL (ref 4.0–10.5)

## 2012-07-04 LAB — GLUCOSE, CAPILLARY
Glucose-Capillary: 79 mg/dL (ref 70–99)
Glucose-Capillary: 133 mg/dL — ABNORMAL HIGH (ref 70–99)
Glucose-Capillary: 124 mg/dL — ABNORMAL HIGH (ref 70–99)

## 2012-07-04 MED ORDER — LIDOCAINE-PRILOCAINE 2.5-2.5 % EX CREA
1.0000 "application " | TOPICAL_CREAM | CUTANEOUS | Status: DC | PRN
  Filled 2012-07-04: qty 5

## 2012-07-04 MED ORDER — HEPARIN SODIUM (PORCINE) 1000 UNIT/ML DIALYSIS: 1000.0000 [IU] | INTRAMUSCULAR | Status: DC | PRN

## 2012-07-04 MED ORDER — PENTAFLUOROPROP-TETRAFLUOROETH EX AERO: 1.0000 "application " | INHALATION_SPRAY | CUTANEOUS | Status: DC | PRN

## 2012-07-04 MED ORDER — INSULIN ASPART 100 UNIT/ML ~~LOC~~ SOLN: 4.0000 [IU] | Freq: Three times a day (TID) | SUBCUTANEOUS | Status: DC

## 2012-07-04 MED ORDER — INSULIN ASPART 100 UNIT/ML ~~LOC~~ SOLN: 6.0000 [IU] | Freq: Three times a day (TID) | SUBCUTANEOUS | Status: DC

## 2012-07-04 MED ORDER — DIPHENHYDRAMINE HCL 25 MG PO CAPS
25.0000 mg | ORAL_CAPSULE | Freq: Four times a day (QID) | ORAL | Status: DC | PRN
  Administered 2012-07-04 – 2012-07-05 (×3): 25 mg via ORAL
  Filled 2012-07-04 (×2): qty 1

## 2012-07-04 MED ORDER — OXYCODONE-ACETAMINOPHEN 5-325 MG PO TABS: 1.0000 | ORAL_TABLET | Freq: Four times a day (QID) | ORAL | Status: DC | PRN

## 2012-07-04 MED ORDER — LIDOCAINE HCL (PF) 1 % IJ SOLN: 5.0000 mL | INTRAMUSCULAR | Status: DC | PRN

## 2012-07-04 MED ORDER — INSULIN ASPART 100 UNIT/ML ~~LOC~~ SOLN: 3.0000 [IU] | Freq: Three times a day (TID) | SUBCUTANEOUS | Status: DC

## 2012-07-04 MED ORDER — SODIUM CHLORIDE 0.9 % IV SOLN: 100.0000 mL | INTRAVENOUS | Status: DC | PRN

## 2012-07-04 MED ORDER — NEPRO/CARBSTEADY PO LIQD: 237.0000 mL | ORAL | Status: DC | PRN

## 2012-07-04 MED ORDER — ALTEPLASE 2 MG IJ SOLR: 2.0000 mg | Freq: Once | INTRAMUSCULAR | Status: AC | PRN

## 2012-07-04 MED ORDER — DIPHENHYDRAMINE HCL 25 MG PO CAPS
ORAL_CAPSULE | ORAL | Status: AC
  Administered 2012-07-04: 25 mg via ORAL
  Filled 2012-07-04: qty 1

## 2012-07-04 MED ORDER — DOXERCALCIFEROL 4 MCG/2ML IV SOLN
INTRAVENOUS | Status: AC
  Filled 2012-07-04: qty 2

## 2012-07-04 MED ORDER — HEPARIN SODIUM (PORCINE) 1000 UNIT/ML DIALYSIS: 100.0000 [IU]/kg | INTRAMUSCULAR | Status: DC | PRN

## 2012-07-04 NOTE — Progress Notes (Signed)
Subjective: Interval History: none.  Objective: Vital signs in last 24 hours: Temp:  [97.6 F (36.4 C)-98.2 F (36.8 C)] 97.7 F (36.5 C) (02/07 0815) Resp:  [15] 15  (02/07 0300) BP: (140)/(73) 140/73 mmHg (02/07 0300) SpO2:  [81 %-100 %] 100 % (02/07 0820) FiO2 (%):  [100 %] 100 % (02/06 1541) Weight:  [72 kg (158 lb 11.7 oz)] 72 kg (158 lb 11.7 oz) (02/07 0500) Weight change: -2 kg (-4 lb 6.6 oz)  Intake/Output from previous day: 02/06 0701 - 02/07 0700 In: 240 [P.O.:240] Out: -  Intake/Output this shift:    General appearance: alert and cooperative Resp: clear to auscultation bilaterally Cardio: S1, S2 normal and systolic murmur: holosystolic 2/6, blowing at apex GI: obese.pos bs. down5 cm Extremities: avg R groin  Lab Results:  Basename 07/03/12 0716 07/02/12 1543  WBC 6.8 6.0  HGB 10.3* 10.0*  HCT 31.2* 30.0*  PLT 185 185   BMET:  Basename 07/04/12 0733 07/03/12 1609  NA 135 136  K 3.6 4.4  CL 92* 97  CO2 27 25  GLUCOSE 86 97  BUN 25* 20  CREATININE 5.69* 4.27*  CALCIUM 9.8 8.8   No results found for this basename: PTH:2 in the last 72 hours Iron Studies: No results found for this basename: IRON,TIBC,TRANSFERRIN,FERRITIN in the last 72 hours  Studies/Results: US Abdomen Complete  07/03/2012  *RADIOLOGY REPORT*  Clinical Data:  Chest pain with meals, history diabetes, renal disease, hypertension, emphysema, prior renal and pancreatic transplant  ULTRASOUND ABDOMEN:  Technique:  Sonography of upper abdominal structures was performed.  Comparison:  CT abdomen and pelvis 06/10/2012  Gallbladder:  Surgically absent  Common bile duct:  Normal caliber 3 mm diameter  Liver:  Normal echogenicity.  No mass or nodularity.  No intrahepatic biliary dilatation.  IVC:  Normal appearance  Pancreas:  Not visualized, obscured by bowel gas  Spleen:  Normal appearance, 7.3 cm length  Right kidney:  Atrophic, 6.8 cm length.  Multiple cysts largest 15 x 12 x 12 mm.  Increased  cortical echogenicity.  Left kidney:  Not visualized, atrophic right prior CT  Aorta:  Normal caliber  Other:  No free intraperitoneal fluid.  Transplant kidney in left lower quadrant, small at 6.3 cm length. No definite focal transplant renal mass.  IMPRESSION: Nonvisualization of pancreas and left kidney. Atrophic right kidney and transplant kidney with small right renal cyst. Post cholecystectomy.   Original Report Authenticated By: Ulyses Southward, M.D.     I have reviewed the patient's current medications.  Assessment/Plan: 1 CRF for HD Some xs vol 2 Anemia stable 3 Access will try small needles 4 DM better control P HD , use AVG, DM control    LOS: 2 days   [name] L 07/04/2012,10:24 AM

## 2012-07-04 NOTE — Progress Notes (Signed)
[facility] PROGRESS NOTE  [name] ZOX:[id number] DOB: [birth year] DOA: 07/02/2012 PCP: Oliver Barre, MD  Assessment/Plan:  1-DKA/Hyperosmolar state: gap close, bicarb increase 27. CBG 86. Patient denies nausea, will start eating. Will resume meal coverage. Continue with Lantus BID. Patient relates tha BID work better for her.   2-Chest pain: probably GI in nature. Esophagitis, gastritis. Protonix BID. LFT normal. RUQ [place] check for gallstone pending.  Lipase 14. Chest pain resolved.    3-Pulmonary Edema/ Dyspnea: Volume overload in patient with ESRD on dialysis.  ECHO: Left ventricle: The cavity size was normal. Wall thickness was normal. Systolic function was normal. The estimated ejection fraction was in the range of 60% to 65%. Wall motion was normal; there were no regional wall motion Abnormalities. - Left atrium: The atrium was mildly dilated. - Pulmonary arteries: Systolic pressure was mildly increased. -Continue with dialysis treatments. Oxygen sat decrease to 80. Will check Chest x ray. Sat increase to 100 on 2 l. Patient relates breathing better.   4-Chronic bronchitis: She relates history of chronic bronchitis. No history of smoking. She follow up with Dr Sherene Sires outpatient. Increase protonix to avoid GERD. Wheezing could be related to hearth failure. I will hold on starting antibiotics at this time. No fevers, no leukocytosis. No more wheezes on lung exam. Change prednisone to home dose.   5-Anemia of chronic disease: Will defer to renal start IV iron, aranesp.  6-Migraine:  dilaudid has help in the pass. She has try percocet in the past, no allergy to percocet.  7-Hyperkalemia: Resolved with correction of hyperglycemia.     Code Status: Full Code.  Family Communication: Care discussed with patient.  Disposition Plan: Will plan to discharge tomorrow after dialysis, if stable. Will need to assess oxygen saturation after dialysis.     Consultants:  Nephrologist.  Procedures:  [place] pending  Antibiotics:  None.   HPI/Subjective: Feeling better. Sitting on the chair. No worsening dyspnea.  Still with headaches. Has try percocet in the past.   Objective: Filed Vitals:   07/04/12 0500 07/04/12 0750 07/04/12 0815 07/04/12 0820  BP:      Pulse:      Temp:   97.7 F (36.5 C)   TempSrc:   Oral   Resp:      Height:      Weight: 72 kg (158 lb 11.7 oz)     SpO2:  81%  100%    Intake/Output Summary (Last 24 hours) at 07/04/12 0957 Last data filed at 07/03/12 1948  Gross per 24 hour  Intake    240 ml  Output      0 ml  Net    240 ml   Filed Weights   07/02/12 2000 07/03/12 0023 07/04/12 0500  Weight: 74 kg (163 lb 2.3 oz) 70.2 kg (154 lb 12.2 oz) 72 kg (158 lb 11.7 oz)    Exam:   General:  No distress.  Cardiovascular: S 1, S 2 RRR  Respiratory: no wheezes, sounds better  Abdomen: Bs present, soft, nt  Data Reviewed: Basic Metabolic Panel:  Lab 07/04/12 8119 07/03/12 1609 07/03/12 1149 07/03/12 1002 07/03/12 0716  NA 135 136 135 132* 126*  K 3.6 4.4 3.9 3.9 6.4*  CL 92* 97 96 94* 89*  CO2 27 25 26 24  18*  GLUCOSE 86 97 274* 459* 639*  BUN 25* 20 19 18 14   CREATININE 5.69* 4.27* 4.01* 3.72* 3.36*  CALCIUM 9.8 8.8 8.1* 7.8* 7.8*  MG -- -- -- -- --  PHOS 3.4 3.3 -- -- --   Liver Function Tests:  Lab 07/04/12 0733 07/03/12 1609 07/02/12 0835  AST -- -- 1516  ALT -- -- 1213  ALKPHOS -- -- 8079  BILITOT -- -- 0.40.4  PROT -- -- 6.46.5  ALBUMIN 4.0 3.6 3.4*3.4*    Lab 07/02/12 1543  LIPASE 14  AMYLASE --   No results found for this basename: AMMONIA:5 in the last 168 hours CBC:  Lab 07/03/12 0716 07/02/12 1543 07/02/12 0825  WBC 6.8 6.0 5.1  NEUTROABS -- -- 3.8  HGB 10.3* 10.0* 9.9*  HCT 31.2* 30.0* 30.4*  MCV 99.0 97.1 98.7  PLT 185 185 180   Cardiac Enzymes:  Lab 07/03/12 0716 07/02/12 2030 07/02/12 1543  CKTOTAL -- -- --  CKMB -- -- --  CKMBINDEX -- -- --   TROPONINI <0.30 <0.30 <0.30   BNP (last 3 results) No results found for this basename: PROBNP:3 in the last 8760 hours CBG:  Lab 07/04/12 0812 07/04/12 0213 07/03/12 2257 07/03/12 1610 07/03/12 1328  GLUCAP 79 172* 318* 91 82    Recent Results (from the past 240 hour(s))  CULTURE, BLOOD (ROUTINE X 2)     Status: Normal (Preliminary result)   Collection Time   07/02/12  4:50 PM      Component Value Range Status Comment   Specimen Description BLOOD RIGHT HAND   Final    Special Requests BOTTLES DRAWN AEROBIC ONLY [facility]   Final    Culture  Setup Time 07/02/2012 23:33   Final    Culture     Final    Value:        BLOOD CULTURE RECEIVED NO GROWTH TO DATE CULTURE WILL BE HELD FOR 5 DAYS BEFORE ISSUING A FINAL NEGATIVE REPORT   Report Status PENDING   Incomplete      Studies: US Abdomen Complete  07/03/2012  *RADIOLOGY REPORT*  Clinical Data:  Chest pain with meals, history diabetes, renal disease, hypertension, emphysema, prior renal and pancreatic transplant  ULTRASOUND ABDOMEN:  Technique:  Sonography of upper abdominal structures was performed.  Comparison:  CT abdomen and pelvis 06/10/2012  Gallbladder:  Surgically absent  Common bile duct:  Normal caliber 3 mm diameter  Liver:  Normal echogenicity.  No mass or nodularity.  No intrahepatic biliary dilatation.  IVC:  Normal appearance  Pancreas:  Not visualized, obscured by bowel gas  Spleen:  Normal appearance, 7.3 cm length  Right kidney:  Atrophic, 6.8 cm length.  Multiple cysts largest 15 x 12 x 12 mm.  Increased cortical echogenicity.  Left kidney:  Not visualized, atrophic right prior CT  Aorta:  Normal caliber  Other:  No free intraperitoneal fluid.  Transplant kidney in left lower quadrant, small at 6.3 cm length. No definite focal transplant renal mass.  IMPRESSION: Nonvisualization of pancreas and left kidney. Atrophic right kidney and transplant kidney with small right renal cyst. Post cholecystectomy.   Original Report Authenticated  By: Ulyses Southward, M.D.     Scheduled Meds:    . albuterol  2.5 mg Nebulization TID  . aspirin EC  81 mg Oral Daily  . calcium acetate  2,001-2,668 mg Oral TID WC  . clonazePAM  1 mg Oral QHS  . docusate sodium  100 mg Oral BID  . doxercalciferol  3 mcg Intravenous Q T,Th,Sa-HD  . guaiFENesin  600 mg Oral BID  . heparin  5,000 Units Subcutaneous Q8H  . insulin aspart  0-9 Units Subcutaneous TID WC  .  insulin glargine  7 Units Subcutaneous BID  . ipratropium  0.5 mg Nebulization TID  . metoprolol succinate  50 mg Oral QHS  . multivitamin  1 tablet Oral Daily  . mycophenolate  250 mg Oral BID  . pantoprazole  40 mg Oral BID  . predniSONE  5 mg Oral Q breakfast  . tacrolimus  1 mg Oral BID   Continuous Infusions:   Active Problems:  DM (diabetes mellitus), type 1 with renal complications    Time spent: 30 minutes.     REGALADO,BELKYS  [facility] Pager [phone]. If 8PM-8AM, please contact night-coverage at [email or web address], password [facility] 07/04/2012, 9:57 AM  LOS: 2 days

## 2012-07-04 NOTE — Procedures (Signed)
I was present at this session.  I have reviewed the session itself and made appropriate changes.  HD VIA 17 ga needles in AVG R groin Mehar Sagen L 2/7/20142:22 PM

## 2012-07-05 DIAGNOSIS — N186 End stage renal disease: Secondary | ICD-10-CM

## 2012-07-05 DIAGNOSIS — E1110 Type 2 diabetes mellitus with ketoacidosis without coma: Secondary | ICD-10-CM

## 2012-07-05 LAB — GLUCOSE, CAPILLARY: Glucose-Capillary: 55 mg/dL — ABNORMAL LOW (ref 70–99)

## 2012-07-05 MED ORDER — GLUCOSE 40 % PO GEL
ORAL | Status: AC
  Administered 2012-07-05: 08:00:00 37.5 g
  Filled 2012-07-05: qty 1

## 2012-07-05 MED ORDER — ALBUTEROL SULFATE (5 MG/ML) 0.5% IN NEBU: 2.5000 mg | INHALATION_SOLUTION | Freq: Three times a day (TID) | RESPIRATORY_TRACT | Status: DC

## 2012-07-05 MED ORDER — INSULIN ASPART 100 UNIT/ML ~~LOC~~ SOLN: SUBCUTANEOUS | Status: DC

## 2012-07-05 MED ORDER — GUAIFENESIN ER 600 MG PO TB12: 600.0000 mg | ORAL_TABLET | Freq: Two times a day (BID) | ORAL | Status: DC

## 2012-07-05 MED ORDER — METOPROLOL SUCCINATE ER 50 MG PO TB24: 50.0000 mg | ORAL_TABLET | Freq: Every day | ORAL | Status: DC

## 2012-07-05 MED ORDER — INSULIN GLARGINE 100 UNIT/ML ~~LOC~~ SOLN: 6.0000 [IU] | Freq: Two times a day (BID) | SUBCUTANEOUS | Status: DC

## 2012-07-05 MED ORDER — CAMPHOR-MENTHOL 0.5-0.5 % EX LOTN
TOPICAL_LOTION | CUTANEOUS | Status: DC | PRN
  Filled 2012-07-05: qty 222

## 2012-07-05 MED ORDER — OXYCODONE-ACETAMINOPHEN 5-325 MG PO TABS: 1.0000 | ORAL_TABLET | Freq: Four times a day (QID) | ORAL | Status: DC | PRN

## 2012-07-05 NOTE — Progress Notes (Signed)
VASCULAR PROGRESS NOTE  SUBJECTIVE: No complaints. Right thigh AVG worked well.   PHYSICAL EXAM: Filed Vitals:   07/04/12 2030 07/04/12 2257 07/05/12 0500 07/05/12 0748  BP:  107/71 113/54 138/67  Pulse:  90 87 78  Temp:   98.1 F (36.7 C) 97.8 F (36.6 C)  TempSrc:   Oral Oral  Resp:   14 10  Height:      Weight:   150 lb 12.7 oz (68.4 kg)   SpO2: 100%  93% 93%   Good thrill in right thigh AVG  LABS: Lab Results  Component Value Date   WBC 9.5 07/04/2012   HGB 10.6* 07/04/2012   HCT 32.3* 07/04/2012   MCV 99.7 07/04/2012   PLT 187 07/04/2012   Lab Results  Component Value Date   CREATININE 5.32* 07/04/2012   Lab Results  Component Value Date   INR 1.06 01/27/2010   CBG (last 3)   Recent Labs  07/04/12 1840 07/04/12 2108 07/05/12 0750  GLUCAP 124* 99 55*    Principal Problem:   Diabetic keto-acidosis Active Problems:   DM (diabetes mellitus), type 1 with renal complications  ASSESSMENT AND PLAN: 1. Right Thigh AVG worked well for HD. 2. I was asked to remove the Diatek cathere 3. Diatek catheter removed at bedside under local anesthesia. 4. Will be available as needed.  Cari Caraway Beeper: [phone] 07/05/2012

## 2012-07-05 NOTE — Discharge Instructions (Signed)
To obtain a Primary Care Physician you can call the toll free number on your insurance card and request a providers list for your area. You may also visit the insurance provider online.  Or you can contact [facility] Connect.    [facility] [phone] or Physician Referral service [phone] choose option #2 or [phone]

## 2012-07-05 NOTE — Progress Notes (Signed)
Subjective: Interval History: has no complaint of Glad to get cath out  Objective: Vital signs in last 24 hours: Temp:  [97.7 F (36.5 C)-98.6 F (37 C)] 97.8 F (36.6 C) (02/08 0748) Pulse Rate:  [78-95] 78 (02/08 0748) Resp:  [9-26] 10 (02/08 0748) BP: (105-149)/(54-83) 138/67 mmHg (02/08 0748) SpO2:  [93 %-100 %] 97 % (02/08 0915) Weight:  [68 kg (149 lb 14.6 oz)-73.2 kg (161 lb 6 oz)] 68.4 kg (150 lb 12.7 oz) (02/08 0500) Weight change: 1.2 kg (2 lb 10.3 oz)  Intake/Output from previous day: 02/07 0701 - 02/08 0700 In: 420 [P.O.:420] Out: -4000  Intake/Output this shift: Total I/O In: 120 [P.O.:120] Out: -   General appearance: alert, cooperative, no distress and moderately obese Chest wall: no tenderness, cath out Cardio: S1, S2 normal and systolic murmur: holosystolic 2/6, blowing at apex GI: obese pos bs Extremities: avg R groin  Lab Results:  Recent Labs  07/03/12 0716 07/04/12 1400  WBC 6.8 9.5  HGB 10.3* 10.6*  HCT 31.2* 32.3*  PLT 185 187   BMET:  Recent Labs  07/04/12 0733 07/04/12 1400  NA 135 133*  K 3.6 3.5  CL 92* 93*  CO2 27 25  GLUCOSE 86 142*  BUN 25* 22  CREATININE 5.69* 5.32*  CALCIUM 9.8 9.8   No results found for this basename: PTH,  in the last 72 hours Iron Studies: No results found for this basename: IRON, TIBC, TRANSFERRIN, FERRITIN,  in the last 72 hours  Studies/Results: US Abdomen Complete  07/03/2012  *RADIOLOGY REPORT*  Clinical Data:  Chest pain with meals, history diabetes, renal disease, hypertension, emphysema, prior renal and pancreatic transplant  ULTRASOUND ABDOMEN:  Technique:  Sonography of upper abdominal structures was performed.  Comparison:  CT abdomen and pelvis 06/10/2012  Gallbladder:  Surgically absent  Common bile duct:  Normal caliber 3 mm diameter  Liver:  Normal echogenicity.  No mass or nodularity.  No intrahepatic biliary dilatation.  IVC:  Normal appearance  Pancreas:  Not visualized, obscured by bowel  gas  Spleen:  Normal appearance, 7.3 cm length  Right kidney:  Atrophic, 6.8 cm length.  Multiple cysts largest 15 x 12 x 12 mm.  Increased cortical echogenicity.  Left kidney:  Not visualized, atrophic right prior CT  Aorta:  Normal caliber  Other:  No free intraperitoneal fluid.  Transplant kidney in left lower quadrant, small at 6.3 cm length. No definite focal transplant renal mass.  IMPRESSION: Nonvisualization of pancreas and left kidney. Atrophic right kidney and transplant kidney with small right renal cyst. Post cholecystectomy.   Original Report Authenticated By: Ulyses Southward, M.D.     I have reviewed the patient's current medications.  Assessment/Plan: 1 ESRD vol and solute ok.  2 Anemia ok 3 DM stable 4 Access stable using groin P HD, epo, use AVG    LOS: 3 days   [name] L 07/05/2012,10:03 AM

## 2012-07-05 NOTE — Discharge Summary (Signed)
Physician Discharge Summary  [name] ZDG:[id number] DOB: [birth year] DOA: 07/02/2012  PCP: Oliver Barre, MD  Admit date: 07/02/2012 Discharge date: 07/05/2012  Time spent: 35 minutes  Recommendations for Outpatient Follow-up:  1. Diabetes educator.   Discharge Diagnoses:    Diabetic keto-acidosis   DM (diabetes mellitus), type 1 with renal complications   Pulmonary edema.    ESRD.   Hyperkalemia   Esophagitis.   Discharge Condition: Improved.  Diet recommendation: Renal and Diabetic diet.  Filed Weights   07/04/12 1336 07/04/12 1749 07/05/12 0500  Weight: 73.2 kg (161 lb 6 oz) 68 kg (149 lb 14.6 oz) 68.4 kg (150 lb 12.7 oz)    History of present illness:  [name] is a 42 y.o. female with PMH Diabetes type 1, ESRD on dialysis, Chronic bronchitis who presents to ED complaining of headaches and chest pain that started day of admission. She is having her regular migraine. She is not able to tolerates Depakote. She is also complaining of chest pain that started 1 week prior to admission. Pain is sharp in quality intermittent, worse with meals. She has been vomiting for last 3 days. No blood on vomit. She also relates mild worsening cough, wheezing, and SOB. She had dialysis day prior to admission. She completed her treatment.  She relates she has been using her insulin. Her dose was recently decrease to once a day during last admission   Hospital Course:  1-DKA/Hyperosmolar state: Patient presents with hyperglycemia, increase anion gap, acidosis. She was started on insulin Gtt. No IV fluids due to history of ESRD on dialysis. After gap close, bicarb increase 27. CBG 86, she was transition to long acting insulin.  Continue with Lantus BID. Patient relates tha BID work better for her.  2-Chest pain: probably GI in nature. Esophagitis, gastritis. Protonix BID. LFT normal. RUQ [place] Nonvisualization of pancreas and left kidney. 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 Post cholecystectomy.  Lipase 14. Chest pain resolved.  3-Pulmonary Edema/ Dyspnea: Volume overload in patient with ESRD on dialysis.  ECHO: Left ventricle: The cavity size was normal. Wall thickness was normal. Systolic function was normal. The estimated ejection fraction was in the range of 60% to 65%. Wall motion was normal; there were no regional wall motion Abnormalities. - Left atrium: The atrium was mildly dilated. - Pulmonary arteries: Systolic pressure was mildly increased.  -Continue with dialysis treatments. Oxygen sat decrease to 80. . Sat increase to 100 on 2 l. Patient relates breathing better. Oxygen Sat normal after dialysis.  4-Chronic bronchitis: She relates history of chronic bronchitis. No history of smoking. She follow up with Dr Sherene Sires outpatient. Increase protonix to avoid GERD. Wheezing could be related to hearth failure. I will hold on starting antibiotics at this time. No fevers, no leukocytosis. No more wheezes on lung exam. Change prednisone to home dose.  5-Anemia of chronic disease: Will defer to renal start IV iron, aranesp.  6-Migraine: dilaudid has help in the pass. She has try percocet in the past, no allergy to percocet. Does not tolerate Depakote. 7-Hyperkalemia: Resolved with correction of hyperglycemia.    Procedures: [place]. Nonvisualization of pancreas and left kidney.  Atrophic right kidney and transplant kidney with small right renal  cyst.  Post cholecystectomy.     Consultations:  Nephrologist  Discharge Exam: Filed Vitals:   07/04/12 2257 07/05/12 0500 07/05/12 0748 07/05/12 0915  BP: 107/71 113/54 138/67   Pulse: 90 87 78   Temp:  98.1 F (36.7 C) 97.8 F (36.6 C)   TempSrc:  Oral Oral   Resp:  14 10   Height:      Weight:  68.4 kg (150 lb 12.7 oz)    SpO2:  93% 93% 97%    General: No distress. Cardiovascular: S 1 S 2 RRR Respiratory: CTA  Discharge Instructions  Discharge Orders   Future Orders Complete By Expires      Diet Carb Modified  As directed     Increase activity slowly  As directed         Medication List    STOP taking these medications       divalproex 250 MG 24 hr tablet  Commonly known as:  DEPAKOTE ER      TAKE these medications       albuterol 108 (90 BASE) MCG/ACT inhaler  Commonly known as:  PROVENTIL HFA;VENTOLIN HFA  Inhale 2 puffs into the lungs every 4 (four) hours as needed. For wheezing.     albuterol (5 MG/ML) 0.5% nebulizer solution  Commonly known as:  PROVENTIL  Take 0.5 mLs (2.5 mg total) by nebulization 3 (three) times daily.     aspirin EC 81 MG tablet  Take 81 mg by mouth daily.     calcium acetate 667 MG capsule  Commonly known as:  PHOSLO  Take 2,001-2,668 mg by mouth 3 (three) times daily with meals. Dosage depends on meal consumed     clonazePAM 0.5 MG tablet  Commonly known as:  KLONOPIN  Take 1 mg by mouth at bedtime.     EXFORGE 10-160 MG per tablet  Generic drug:  amLODipine-valsartan  Take 1 tablet by mouth Daily.     guaiFENesin 600 MG 12 hr tablet  Commonly known as:  MUCINEX  Take 1 tablet (600 mg total) by mouth 2 (two) times daily.     insulin aspart 100 UNIT/ML injection  Commonly known as:  novoLOG  Takes 6 units in the am the lunch and dinner.     insulin glargine 100 UNIT/ML injection  Commonly known as:  LANTUS  Inject 6 Units into the skin 2 (two) times daily.     metoprolol succinate 50 MG 24 hr tablet  Commonly known as:  TOPROL-XL  Take 1 tablet (50 mg total) by mouth at bedtime. Take with or immediately following a meal.     mycophenolate 250 MG capsule  Commonly known as:  CELLCEPT  Take 250 mg by mouth 2 (two) times daily.     oxyCODONE-acetaminophen 5-325 MG per tablet  Commonly known as:  PERCOCET/ROXICET  Take 1-2 tablets by mouth every 6 (six) hours as needed.     pantoprazole 40 MG tablet  Commonly known as:  PROTONIX  Take 40 mg by mouth daily.     predniSONE 5 MG tablet  Commonly known as:   DELTASONE  Take 1 tablet by mouth daily.     tacrolimus 1 MG capsule  Commonly known as:  PROGRAF  Take 1 mg by mouth 2 (two) times daily.     zolpidem 10 MG tablet  Commonly known as:  AMBIEN  Take 10 mg by mouth at bedtime. For insomnia.          The results of significant diagnostics from this hospitalization (including imaging, microbiology, ancillary and laboratory) are listed below for reference.    Significant Diagnostic Studies: Ct Abdomen Pelvis Wo Contrast  06/10/2012  *RADIOLOGY REPORT*  Clinical Data: Nausea and vomiting for  2 days.  Elevated blood sugars.  Fevers.  CT ABDOMEN AND PELVIS WITHOUT CONTRAST  Technique:  Multidetector CT imaging of the abdomen and pelvis was performed following the standard protocol without intravenous contrast.  Comparison: 09/14/2010.  Findings: The small right pleural effusion.  Diffuse airspace infiltration noted in the lung bases.  Changes could represent edema or pneumonia.  Mild cardiac enlargement.  There appears to be a small esophageal hiatal hernia with opaque densities in the distal esophagus likely representing opaque medication.  Opaque medication also present in the stomach.  Surgical absence of the gallbladder.  The unenhanced appearance of the liver, spleen, pancreas, adrenal glands, and retroperitoneal lymph nodes is unremarkable. The kidneys are atrophic and appear to be a cyst filled.  No hydronephrosis.  Vascular calcifications. Calcification of the aorta without aneurysm.  The stomach, small bowel, and colon are not abnormally distended.  There are postoperative changes in the right lower quadrant correlating with history of pancreatic transplantation.  There is mild diffuse thickening of the wall of the colon with hazy pericolonic infiltration and infiltration in the omentum.  Changes suggest diffuse colitis. No free air or free fluid in the abdomen.  Pelvis:  There is a small amount of free fluid in the pelvis.  The uterus and  adnexal structures are not significantly enlarged. Rounded structure in the left pelvis measuring 2.5 x 3.3 cm likely represents transplant kidney and is decreasing in size since the previous study. No loculated pelvic fluid collections.  The appendix is normal.  No diverticulitis.  IMPRESSION: Diffuse colonic wall thickening and pericolonic infiltration suggesting pan colitis.  Small right pleural effusion.  Airspace infiltration in both lung bases.   Original Report Authenticated By: Burman Nieves, M.D.    Dg Chest 2 View  07/02/2012  *RADIOLOGY REPORT*  Clinical Data: Dialysis patient with cough and shortness of breath.  CHEST - 2 VIEW  Comparison: 06/10/2012.  Findings: Left IJ dialysis catheters are unchanged near the SVC right atrial junction.  There is stable cardiomegaly.  There are recurrent basilar air space opacities associated with small bilateral pleural effusions.  The osseous structures appear unchanged.  Cholecystectomy clips are noted.  IMPRESSION: Recurrent basilar air space opacities and bilateral pleural effusions most consistent with recurrent congestive heart failure or volume overload.  Early infection not excluded.   Original Report Authenticated By: Carey Bullocks, M.D.    Dg Chest 2 View  06/10/2012  *RADIOLOGY REPORT*  Clinical Data: Hypoxemia, dialysis patient  CHEST - 2 VIEW  Comparison: 06/09/2012  Findings: Left IJ dialysis catheter tips in the SVC RA junction. Stable cardiomegaly.  Resolving mild edema pattern throughout the lower lobes.  No current CHF or effusion.  No focal pneumonia, collapse consolidation.  Negative for pneumothorax.  Trachea is midline.  Prior cholecystectomy noted.  IMPRESSION: Resolving mild edema pattern.   Original Report Authenticated By: Judie Petit. Shick, M.D.    Dg Chest 2 View  06/09/2012  *RADIOLOGY REPORT*  Clinical Data: Cough, shortness of breath, history hypertension, diabetes, end-stage renal disease, asthma  CHEST - 2 VIEW  Comparison: 12/08/2011   Findings: Left jugular dual-lumen central venous catheter with distal tip projecting over cavoatrial junction. Enlargement of cardiac silhouette with pulmonary vascular congestion. Question minimal perihilar edema particularly on the right. No gross pleural effusion or pneumothorax. Bones diffusely demineralized.  IMPRESSION: Enlargement of cardiac silhouette with pulmonary vascular congestion and question minimal pulmonary edema/failure.   Original Report Authenticated By: Ulyses Southward, M.D.    US Abdomen Complete  07/03/2012  *RADIOLOGY REPORT*  Clinical Data:  Chest pain with meals, history diabetes, renal disease, hypertension, emphysema, prior renal and pancreatic transplant  ULTRASOUND ABDOMEN:  Technique:  Sonography of upper abdominal structures was performed.  Comparison:  CT abdomen and pelvis 06/10/2012  Gallbladder:  Surgically absent  Common bile duct:  Normal caliber 3 mm diameter  Liver:  Normal echogenicity.  No mass or nodularity.  No intrahepatic biliary dilatation.  IVC:  Normal appearance  Pancreas:  Not visualized, obscured by bowel gas  Spleen:  Normal appearance, 7.3 cm length  Right kidney:  Atrophic, 6.8 cm length.  Multiple cysts largest 15 x 12 x 12 mm.  Increased cortical echogenicity.  Left kidney:  Not visualized, atrophic right prior CT  Aorta:  Normal caliber  Other:  No free intraperitoneal fluid.  Transplant kidney in left lower quadrant, small at 6.3 cm length. No definite focal transplant renal mass.  IMPRESSION: Nonvisualization of pancreas and left kidney. Atrophic right kidney and transplant kidney with small right renal cyst. Post cholecystectomy.   Original Report Authenticated By: Ulyses Southward, M.D.     Microbiology: Recent Results (from the past 240 hour(s))  CULTURE, BLOOD (ROUTINE X 2)     Status: None   Collection Time    07/02/12  4:50 PM      Result Value Range Status   Specimen Description BLOOD RIGHT HAND   Final   Special Requests BOTTLES DRAWN AEROBIC ONLY  7CC   Final   Culture  Setup Time 07/02/2012 23:33   Final   Culture     Final   Value:        BLOOD CULTURE RECEIVED NO GROWTH TO DATE CULTURE WILL BE HELD FOR 5 DAYS BEFORE ISSUING A FINAL NEGATIVE REPORT   Report Status PENDING   Incomplete  CULTURE, BLOOD (ROUTINE X 2)     Status: None   Collection Time    07/02/12 11:30 PM      Result Value Range Status   Specimen Description BLOOD HEMODIALYSIS CATHETER   Final   Special Requests BOTTLES DRAWN AEROBIC AND ANAEROBIC 10CC   Final   Culture  Setup Time 07/03/2012 09:55   Final   Culture     Final   Value:        BLOOD CULTURE RECEIVED NO GROWTH TO DATE CULTURE WILL BE HELD FOR 5 DAYS BEFORE ISSUING A FINAL NEGATIVE REPORT   Report Status PENDING   Incomplete  CULTURE, BLOOD (ROUTINE X 2)     Status: None   Collection Time    07/02/12 11:55 PM      Result Value Range Status   Specimen Description BLOOD HEMODIALYSIS CATHETER   Final   Special Requests BOTTLES DRAWN AEROBIC AND ANAEROBIC 10CC EA   Final   Culture  Setup Time 07/03/2012 04:22   Final   Culture     Final   Value:        BLOOD CULTURE RECEIVED NO GROWTH TO DATE CULTURE WILL BE HELD FOR 5 DAYS BEFORE ISSUING A FINAL NEGATIVE REPORT   Report Status PENDING   Incomplete     Labs: Basic Metabolic Panel:  Recent Labs Lab 07/03/12 1002 07/03/12 1149 07/03/12 1609 07/04/12 0733 07/04/12 1400  NA 132* 135 136 135 133*  K 3.9 3.9 4.4 3.6 3.5  CL 94* 96 97 92* 93*  CO2 24 26 25 27 25   GLUCOSE 459* 274* 97 86 142*  BUN 18 19 20  25* 22  CREATININE 3.72* 4.01* 4.27* 5.69* 5.32*  CALCIUM 7.8* 8.1* 8.8 9.8 9.8  PHOS  --   --  3.3 3.4 3.5   Liver Function Tests:  Recent Labs Lab 07/02/12 0835 07/03/12 1609 07/04/12 0733 07/04/12 1400  AST 15  16  --   --  17  ALT 12  13  --   --  12  ALKPHOS 80  79  --   --  74  BILITOT 0.4  0.4  --   --  0.3  PROT 6.4  6.5  --   --  6.8  ALBUMIN 3.4*  3.4* 3.6 4.0 3.6    Recent Labs Lab 07/02/12 1543  LIPASE 14    No results found for this basename: AMMONIA,  in the last 168 hours CBC:  Recent Labs Lab 07/02/12 0825 07/02/12 1543 07/03/12 0716 07/04/12 1400  WBC 5.1 6.0 6.8 9.5  NEUTROABS 3.8  --   --   --   HGB 9.9* 10.0* 10.3* 10.6*  HCT 30.4* 30.0* 31.2* 32.3*  MCV 98.7 97.1 99.0 99.7  PLT 180 185 185 187   Cardiac Enzymes:  Recent Labs Lab 07/02/12 1543 07/02/12 2030 07/03/12 0716  TROPONINI <0.30 <0.30 <0.30   BNP: BNP (last 3 results) No results found for this basename: PROBNP,  in the last 8760 hours CBG:  Recent Labs Lab 07/04/12 1619 07/04/12 1840 07/04/12 2108 07/05/12 0750 07/05/12 0906  GLUCAP 133* 124* 99 55* 81       Signed:  Imaan Padgett  [facility] 07/05/2012, 10:46 AM

## 2012-07-06 NOTE — Progress Notes (Signed)
   CARE MANAGEMENT NOTE 07/06/2012  Patient:  DUAA, STELZNER   Account Number:  [id number]  Date Initiated:  07/06/2012  Documentation initiated by:  [facility], [place]  Subjective/Objective Assessment:     Action/Plan:   Anticipated DC Date:  07/05/2012   Anticipated DC Plan:  HOME/SELF CARE      DC Planning Services  CM consult      Choice offered to / List presented to:             Status of service:  Completed, signed off Medicare Important Message given?   (If response is "NO", the following Medicare IM given date fields will be blank) Date Medicare IM given:   Date Additional Medicare IM given:    Discharge Disposition:  HOME/SELF CARE  Per UR Regulation:    If discussed at Long Length of Stay Meetings, dates discussed:    Comments:  07/05/2012 1200 NCM spoke to pt and provide instructions on how to locate a PCP. She can contact insurance plan by calling toll free number on card or contact [employer] [facility]. Information on dc instructions to follow up wihen at home. Isidoro Donning RN CCM Case Mgmt phone [phone]

## 2012-07-09 LAB — CULTURE, BLOOD (ROUTINE X 2)

## 2012-07-10 LAB — CULTURE, BLOOD (ROUTINE X 2): Culture: NO GROWTH

## 2012-07-18 NOTE — Telephone Encounter (Signed)
Signed domestic return receipt received verifying delivery of certified dismissal letter on 06/25/2012.   Article number [id number]. rmf

## 2012-08-20 IMAGING — CR DG CHEST 2V
3 series · 3 of 3 positions shown · non-contrast
Comparison: Chest radiograph 05/02/2010

CLINICAL DATA: Pain, nausea, vomiting

CHEST - 2 VIEW

[w chest pa (1 of 2)]
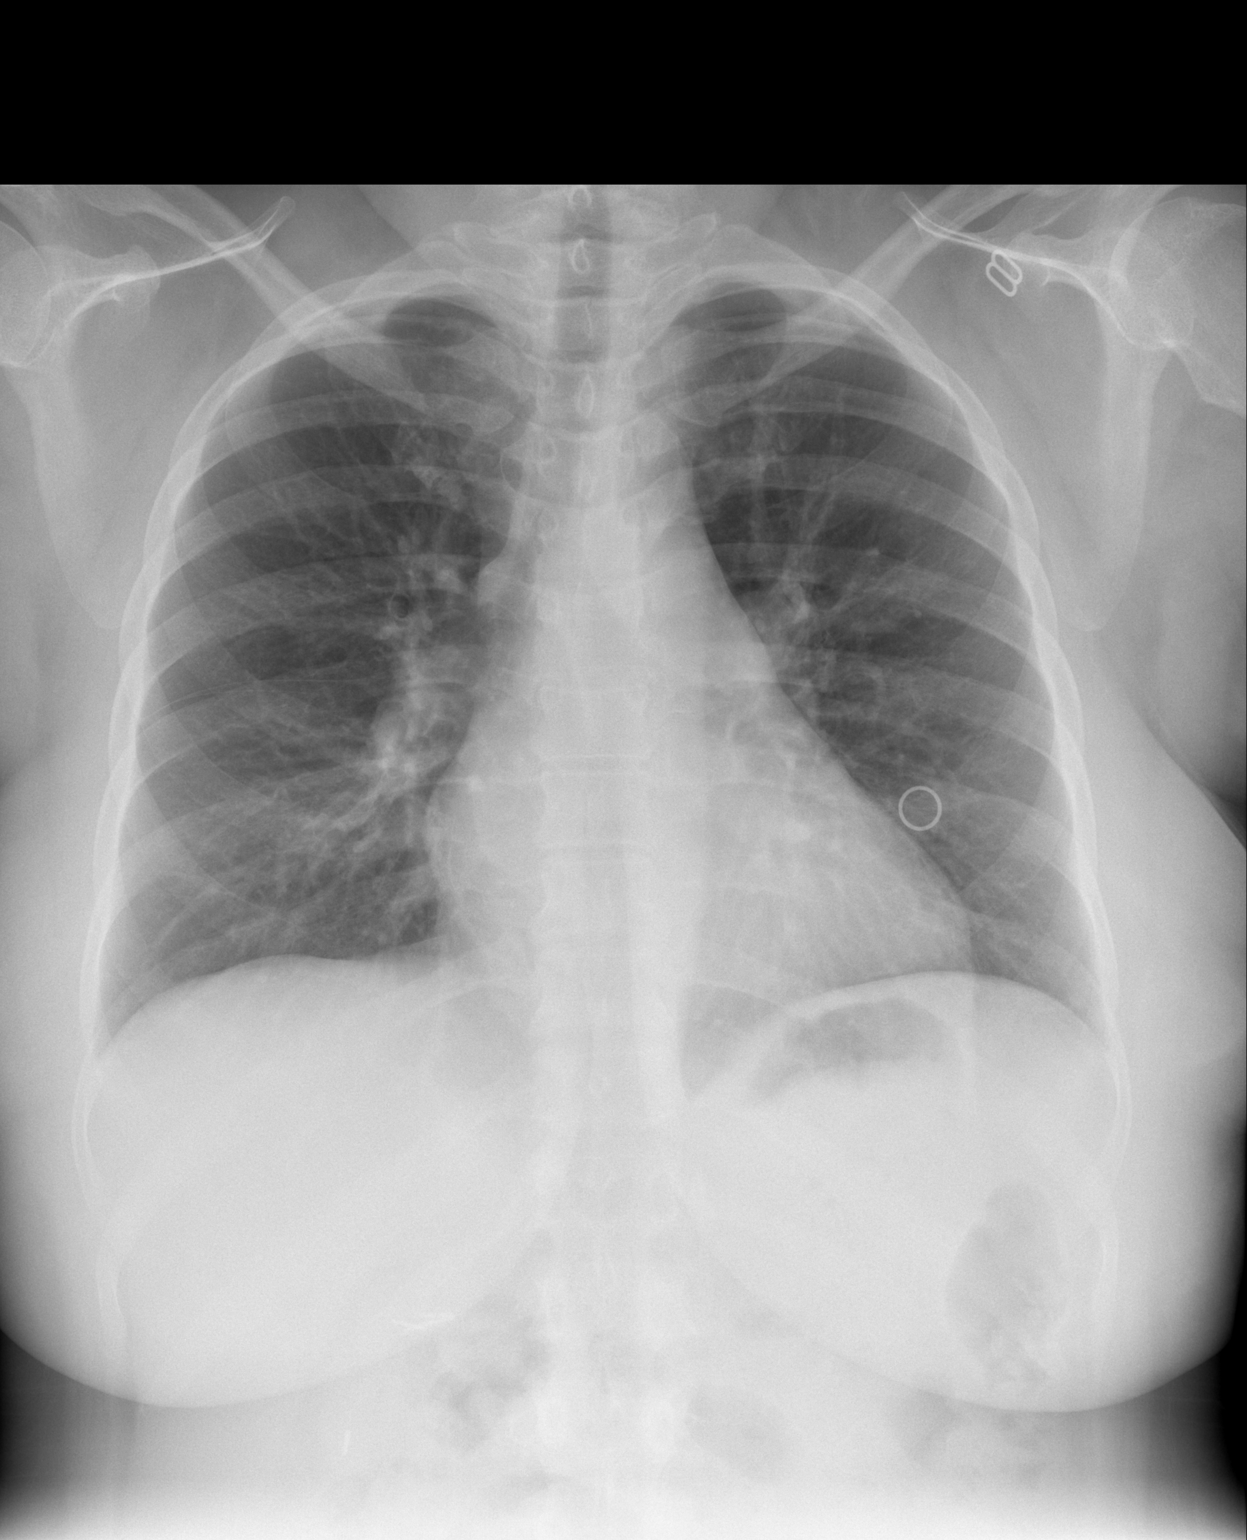

[w chest pa (2 of 2)]
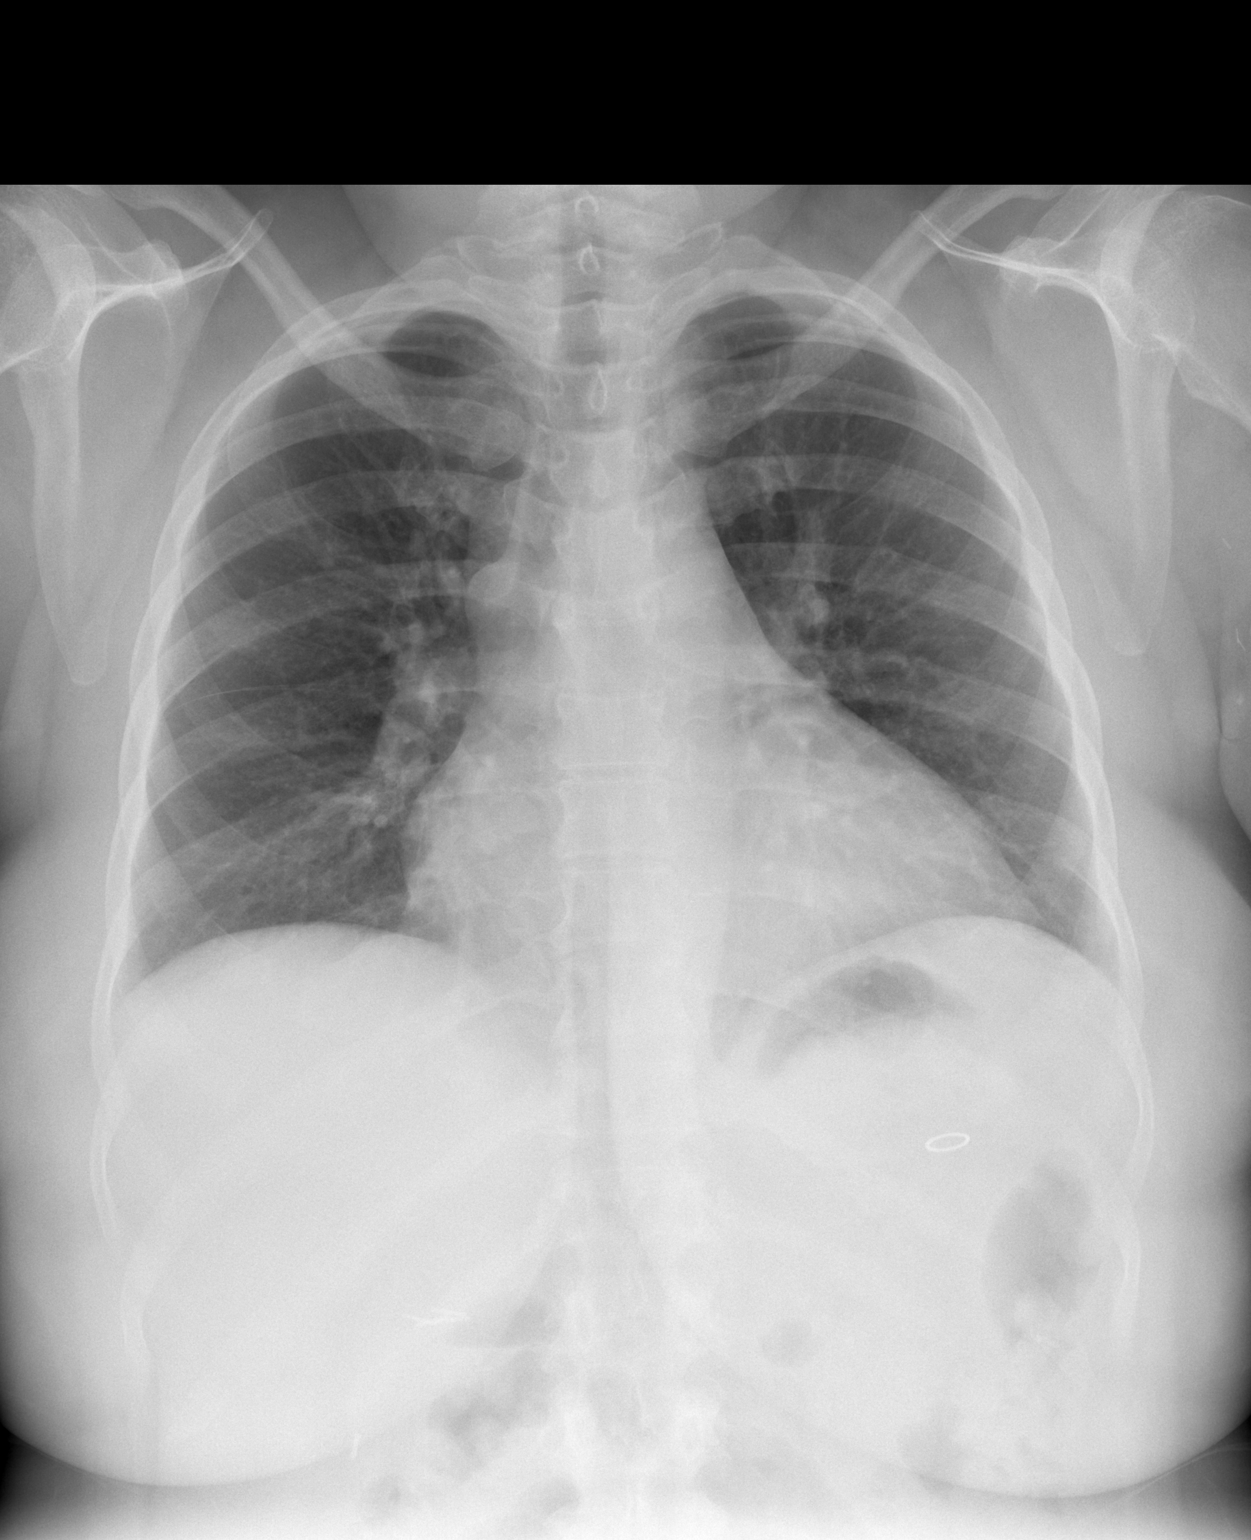

[w chest lat]
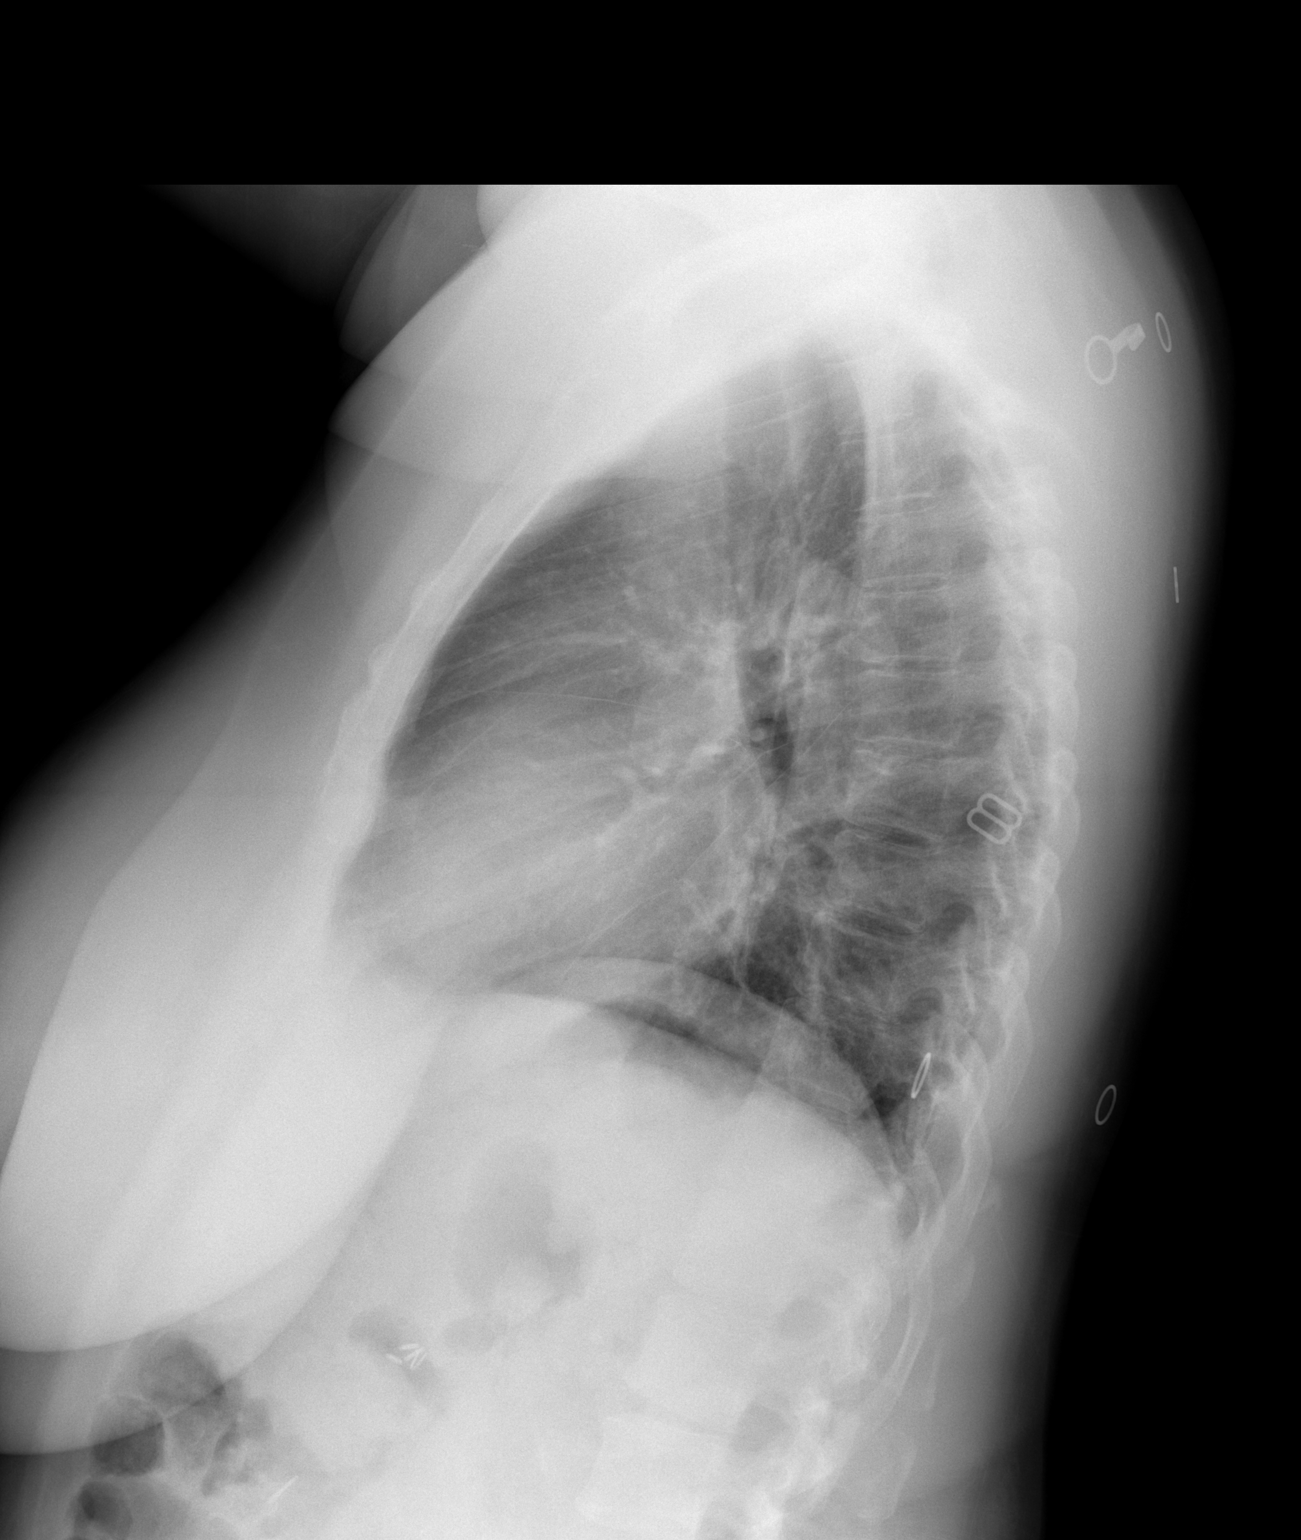

[3 of 3 positions shown; findings below may reference images not displayed]

FINDINGS: Stable cardiomegaly.  Borderline pulmonary vascular
congestion.  No focal airspace disease, effusion, or edema.
Negative for pneumothorax.  Cholecystectomy clips noted.  Left
axillary surgical clips.  No acute bony abnormality.  No evidence
of free air beneath the diaphragm.
IMPRESSION: Stable cardiomegaly.  No acute findings.

## 2012-08-23 IMAGING — US IR FLUORO GUIDE CV LINE*R*
1 series · 1 of 1 positions shown · non-contrast
Comparison: none

CLINICAL DATA: History of end-stage renal disease.  The patient has
limited IV access options and requires a central line for IV access
needs.

[Series 1: ir fluoro guide cv line*right* · 1 of 1 slices shown]
[im 1/1]
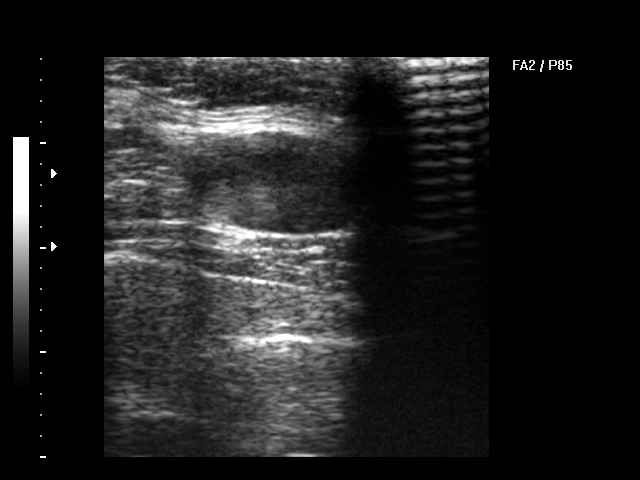

[1 of 1 positions shown; findings below may reference images not displayed]

NON-TUNNELED CENTRAL VENOUS CATHETER PLACEMENT WITH ULTRASOUND AND
FLUOROSCOPIC GUIDANCE

Fluoroscopy Time:  0.2 minutes.

Procedure:  The procedure, risks, benefits, and alternatives were
explained to the patient.  Questions regarding the procedure were
encouraged and answered.  The patient understands and consents to
the procedure.

The right neck and chest were prepped with chlorhexidine in a
sterile fashion, and a sterile drape was applied covering the
operative field.  Maximum barrier sterile technique with sterile
gowns and gloves were used for the procedure.  Local anesthesia was
provided with 1% lidocaine.

After creating a small venotomy incision, a 21 gauge needle was
advanced into the right external jugular vein under direct, real-
time ultrasound guidance.  Ultrasound image documentation was
performed.  After securing guidewire access, a peel-away sheath was
placed over a guide wire.

Utilizing guide wire measurement, a a 5-French, dual lumen non
tunneled catheter was cut to 15 cm.

The catheter was then placed through the sheath and the sheath
removed.  Final catheter positioning was confirmed and documented
with a fluoroscopic spot image.  The catheter was aspirated,
flushed with saline, and injected with appropriate volume heparin
dwells.  The catheter exit site was secured with 0-Prolene
retention sutures.

Complications: None.  No pneumothorax.
FINDINGS: Ultrasound demonstrates no patent internal jugular vein
on the right.  There is a prominent collateral vein likely
representing a tortuous external jugular vein and extends into the
chest.  This was accessed and a line placed.  The catheter could
only be advanced to the level of the medial subclavian vein near
the origin of the innominate vein.  The catheter aspirates normally
and is ready for immediate use.
IMPRESSION: Placement of non-tunneled central venous catheter via the right
external jugular vein.  The catheter tip lies in the medial right
subclavian vein near the origin of the innominate vein.  The
catheter is ready for immediate use.

## 2012-08-25 IMAGING — CR DG CHEST 2V
2 series · 2 of 2 positions shown · non-contrast
Comparison: Plain films of the chest 07/30/2010 and
05/05/2009.Fluoroscopic spot views from central line placement
[DATE] reviewed.

CLINICAL DATA: Shortness of breath.

CHEST - 2 VIEW

[view not recorded (1 of 2)]
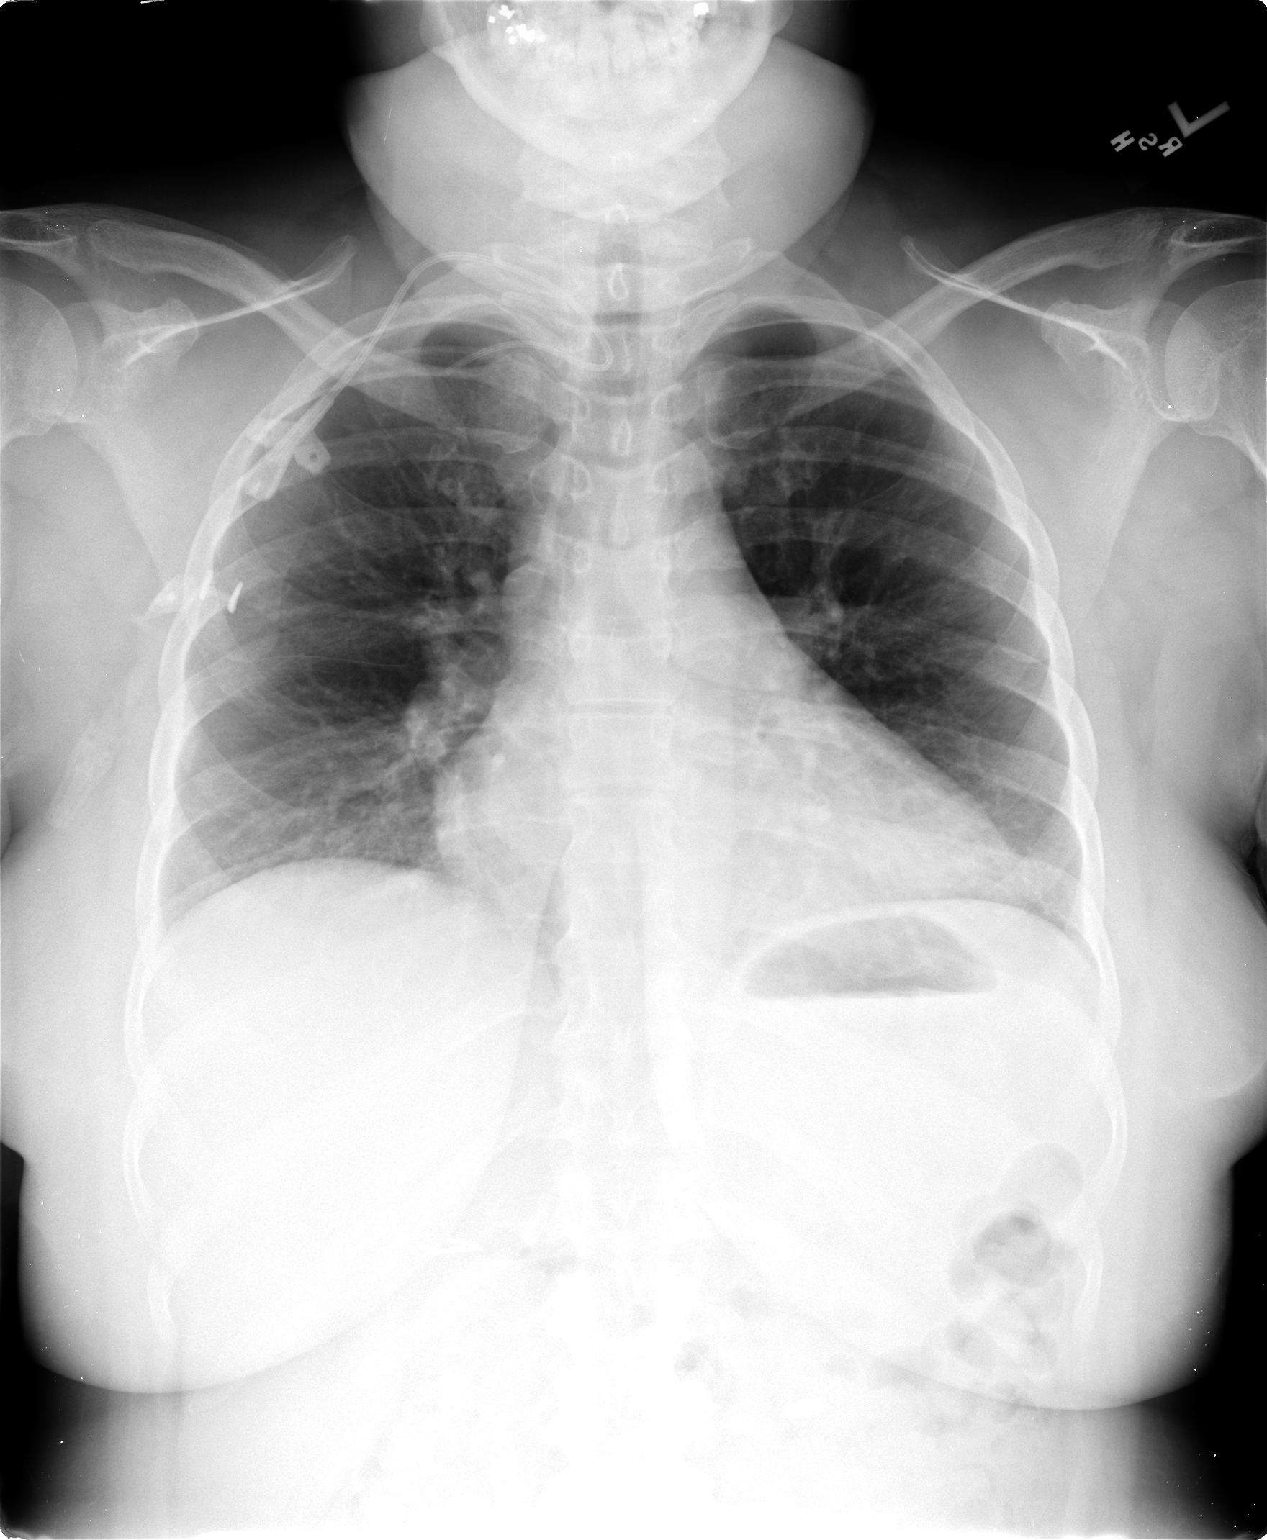

[view not recorded (2 of 2)]
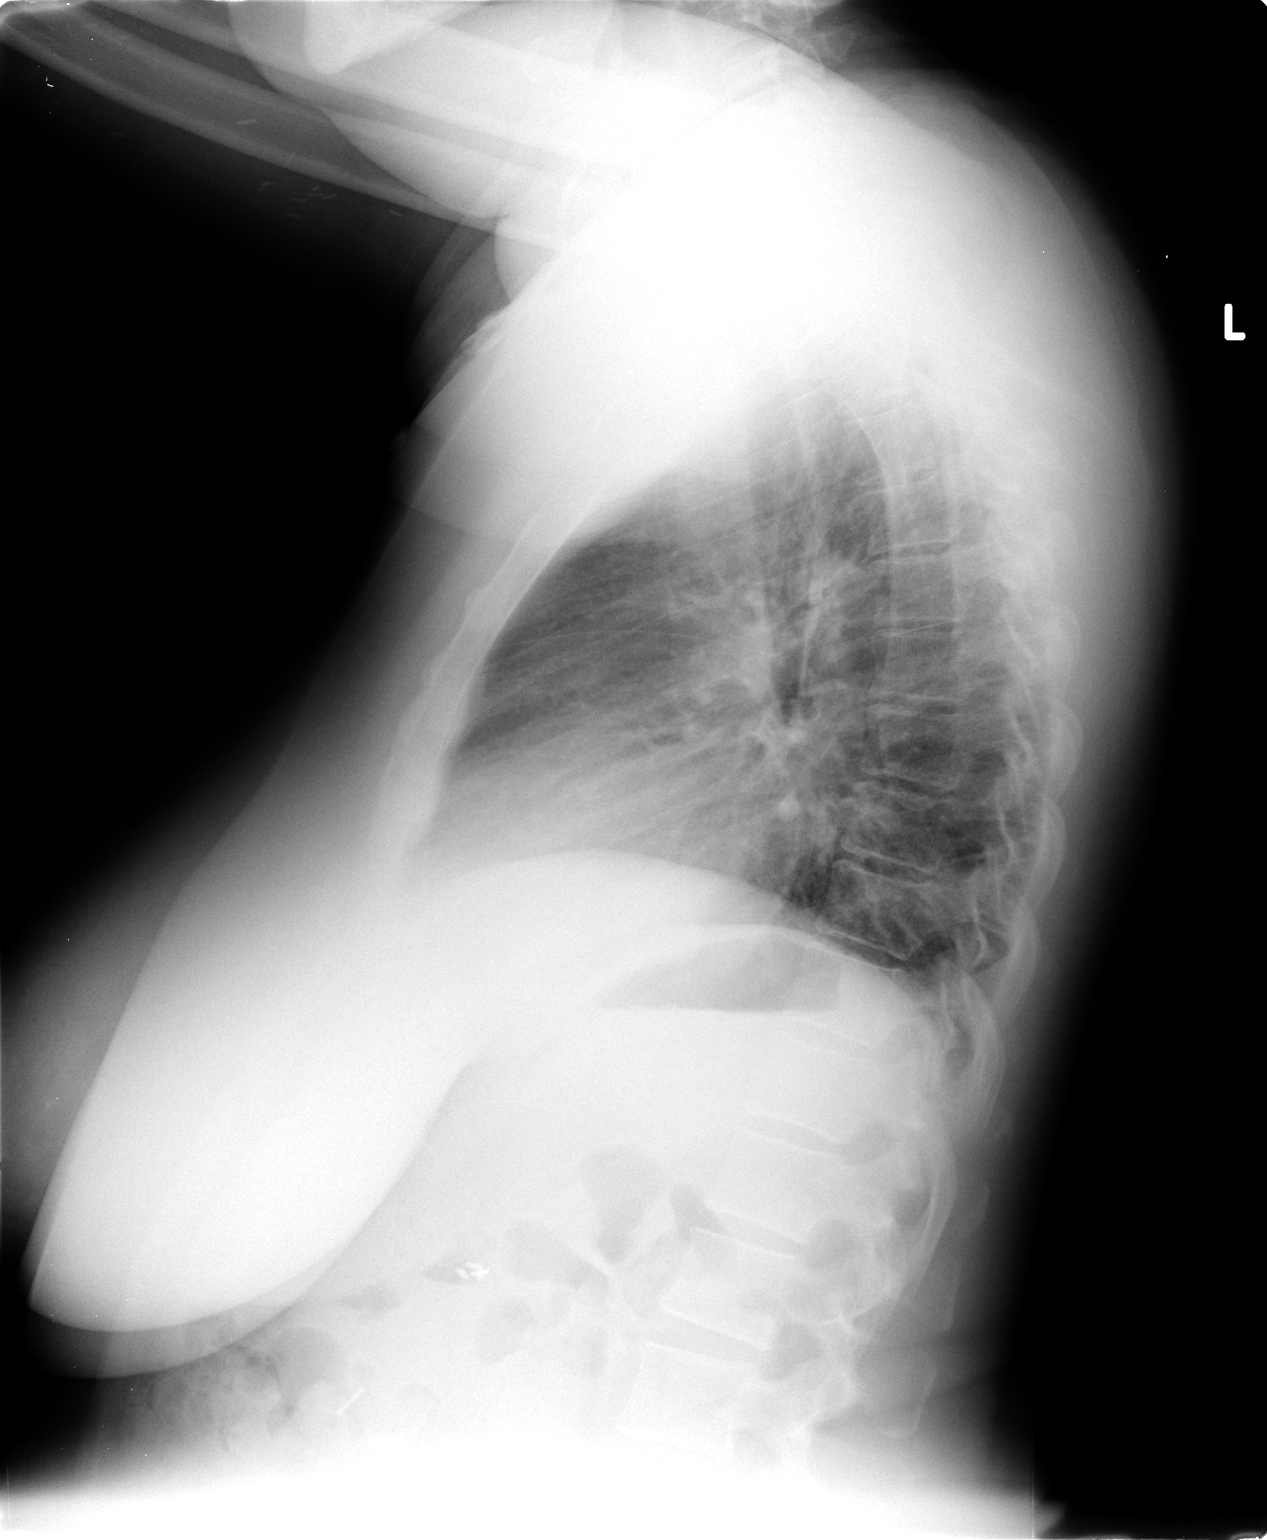

[2 of 2 positions shown; findings below may reference images not displayed]

FINDINGS: Right IJ approach central venous catheter remains in
place with the tip in the right subclavian vein, unchanged.  Lungs
are clear.  No pneumothorax or pleural effusion.  Heart size upper
normal.
IMPRESSION: No acute abnormality.

## 2012-09-03 ENCOUNTER — Encounter (HOSPITAL_COMMUNITY): Payer: Self-pay | Admitting: Emergency Medicine

## 2012-09-03 ENCOUNTER — Emergency Department (HOSPITAL_COMMUNITY)
Admission: EM | Admit: 2012-09-03 | Discharge: 2012-09-03 | Disposition: A | Discharge status: Home / Self Care | Payer: Medicare Other | Attending: Emergency Medicine | Admitting: Emergency Medicine

## 2012-09-03 DIAGNOSIS — F329 Major depressive disorder, single episode, unspecified: Secondary | ICD-10-CM | POA: Insufficient documentation

## 2012-09-03 DIAGNOSIS — K219 Gastro-esophageal reflux disease without esophagitis: Secondary | ICD-10-CM | POA: Insufficient documentation

## 2012-09-03 DIAGNOSIS — R739 Hyperglycemia, unspecified: Secondary | ICD-10-CM

## 2012-09-03 DIAGNOSIS — Z8679 Personal history of other diseases of the circulatory system: Secondary | ICD-10-CM | POA: Insufficient documentation

## 2012-09-03 DIAGNOSIS — Z87828 Personal history of other (healed) physical injury and trauma: Secondary | ICD-10-CM | POA: Insufficient documentation

## 2012-09-03 DIAGNOSIS — E11319 Type 2 diabetes mellitus with unspecified diabetic retinopathy without macular edema: Secondary | ICD-10-CM | POA: Insufficient documentation

## 2012-09-03 DIAGNOSIS — E1339 Other specified diabetes mellitus with other diabetic ophthalmic complication: Secondary | ICD-10-CM | POA: Insufficient documentation

## 2012-09-03 DIAGNOSIS — E109 Type 1 diabetes mellitus without complications: Secondary | ICD-10-CM | POA: Insufficient documentation

## 2012-09-03 DIAGNOSIS — Z8701 Personal history of pneumonia (recurrent): Secondary | ICD-10-CM | POA: Insufficient documentation

## 2012-09-03 DIAGNOSIS — Z794 Long term (current) use of insulin: Secondary | ICD-10-CM | POA: Insufficient documentation

## 2012-09-03 DIAGNOSIS — N186 End stage renal disease: Secondary | ICD-10-CM | POA: Insufficient documentation

## 2012-09-03 DIAGNOSIS — E785 Hyperlipidemia, unspecified: Secondary | ICD-10-CM | POA: Insufficient documentation

## 2012-09-03 DIAGNOSIS — R51 Headache: Secondary | ICD-10-CM | POA: Insufficient documentation

## 2012-09-03 DIAGNOSIS — Z872 Personal history of diseases of the skin and subcutaneous tissue: Secondary | ICD-10-CM | POA: Insufficient documentation

## 2012-09-03 DIAGNOSIS — Z7982 Long term (current) use of aspirin: Secondary | ICD-10-CM | POA: Insufficient documentation

## 2012-09-03 DIAGNOSIS — Z79899 Other long term (current) drug therapy: Secondary | ICD-10-CM | POA: Insufficient documentation

## 2012-09-03 DIAGNOSIS — F3289 Other specified depressive episodes: Secondary | ICD-10-CM | POA: Insufficient documentation

## 2012-09-03 DIAGNOSIS — E669 Obesity, unspecified: Secondary | ICD-10-CM | POA: Insufficient documentation

## 2012-09-03 DIAGNOSIS — Z8709 Personal history of other diseases of the respiratory system: Secondary | ICD-10-CM | POA: Insufficient documentation

## 2012-09-03 DIAGNOSIS — J45909 Unspecified asthma, uncomplicated: Secondary | ICD-10-CM | POA: Insufficient documentation

## 2012-09-03 DIAGNOSIS — Z992 Dependence on renal dialysis: Secondary | ICD-10-CM | POA: Insufficient documentation

## 2012-09-03 DIAGNOSIS — E1010 Type 1 diabetes mellitus with ketoacidosis without coma: Secondary | ICD-10-CM | POA: Insufficient documentation

## 2012-09-03 DIAGNOSIS — E1029 Type 1 diabetes mellitus with other diabetic kidney complication: Secondary | ICD-10-CM | POA: Insufficient documentation

## 2012-09-03 DIAGNOSIS — Z8669 Personal history of other diseases of the nervous system and sense organs: Secondary | ICD-10-CM | POA: Insufficient documentation

## 2012-09-03 DIAGNOSIS — I120 Hypertensive chronic kidney disease with stage 5 chronic kidney disease or end stage renal disease: Secondary | ICD-10-CM | POA: Insufficient documentation

## 2012-09-03 DIAGNOSIS — Z862 Personal history of diseases of the blood and blood-forming organs and certain disorders involving the immune mechanism: Secondary | ICD-10-CM | POA: Insufficient documentation

## 2012-09-03 DIAGNOSIS — Z8639 Personal history of other endocrine, nutritional and metabolic disease: Secondary | ICD-10-CM | POA: Insufficient documentation

## 2012-09-03 LAB — CBC WITH DIFFERENTIAL/PLATELET
Basophils Absolute: 0 10*3/uL (ref 0.0–0.1)
Basophils Relative: 0 % (ref 0–1)
Eosinophils Relative: 1 % (ref 0–5)
HCT: 32.7 % — ABNORMAL LOW (ref 36.0–46.0)
Hemoglobin: 10.8 g/dL — ABNORMAL LOW (ref 12.0–15.0)
Lymphocytes Relative: 22 % (ref 12–46)
MCHC: 33 g/dL (ref 30.0–36.0)
MCV: 95.3 fL (ref 78.0–100.0)
Monocytes Absolute: 0.3 10*3/uL (ref 0.1–1.0)
Monocytes Relative: 5 % (ref 3–12)
RDW: 13.4 % (ref 11.5–15.5)

## 2012-09-03 LAB — COMPREHENSIVE METABOLIC PANEL
ALT: 18 U/L (ref 0–35)
AST: 18 U/L (ref 0–37)
Albumin: 3.3 g/dL — ABNORMAL LOW (ref 3.5–5.2)
Alkaline Phosphatase: 99 U/L (ref 39–117)
BUN: 36 mg/dL — ABNORMAL HIGH (ref 6–23)
CO2: 27 mEq/L (ref 19–32)
Calcium: 8.5 mg/dL (ref 8.4–10.5)
Chloride: 93 mEq/L — ABNORMAL LOW (ref 96–112)
Creatinine, Ser: 6.62 mg/dL — ABNORMAL HIGH (ref 0.50–1.10)
GFR calc Af Amer: 8 mL/min — ABNORMAL LOW (ref >90)
GFR calc non Af Amer: 7 mL/min — ABNORMAL LOW (ref >90)
Glucose, Bld: 358 mg/dL — ABNORMAL HIGH (ref 70–99)
Potassium: 3.6 mEq/L (ref 3.5–5.1)
Sodium: 132 mEq/L — ABNORMAL LOW (ref 135–145)
Total Bilirubin: 0.6 mg/dL (ref 0.3–1.2)
Total Protein: 6.6 g/dL (ref 6.0–8.3)

## 2012-09-03 LAB — POCT I-STAT 3, ART BLOOD GAS (G3+)
Bicarbonate: 25 mEq/L — ABNORMAL HIGH (ref 20.0–24.0)
O2 Saturation: 90 %
Patient temperature: 98.6

## 2012-09-03 LAB — GLUCOSE, CAPILLARY
Glucose-Capillary: 447 mg/dL — ABNORMAL HIGH (ref 70–99)
Glucose-Capillary: 153 mg/dL — ABNORMAL HIGH (ref 70–99)

## 2012-09-03 LAB — POCT I-STAT 3, VENOUS BLOOD GAS (G3P V)
Bicarbonate: 26.3 mEq/L — ABNORMAL HIGH (ref 20.0–24.0)
O2 Saturation: 59 %
TCO2: 27 mmol/L (ref 0–100)

## 2012-09-03 MED ORDER — INSULIN ASPART 100 UNIT/ML ~~LOC~~ SOLN
10.0000 [IU] | Freq: Once | SUBCUTANEOUS | Status: AC
  Administered 2012-09-03: 10 [IU] via SUBCUTANEOUS
  Filled 2012-09-03: qty 1

## 2012-09-03 MED ORDER — OXYCODONE-ACETAMINOPHEN 5-325 MG PO TABS
1.0000 | ORAL_TABLET | Freq: Once | ORAL | Status: AC
  Administered 2012-09-03: 1 via ORAL
  Filled 2012-09-03: qty 1

## 2012-09-03 MED ORDER — SODIUM CHLORIDE 0.9 % IV BOLUS (SEPSIS): 1000.0000 mL | Freq: Once | INTRAVENOUS | Status: DC

## 2012-09-03 MED ORDER — OXYCODONE-ACETAMINOPHEN 5-325 MG PO TABS: 1.0000 | ORAL_TABLET | Freq: Four times a day (QID) | ORAL | Status: DC | PRN

## 2012-09-03 MED ORDER — METOCLOPRAMIDE HCL 5 MG/ML IJ SOLN
10.0000 mg | Freq: Once | INTRAMUSCULAR | Status: AC
  Administered 2012-09-03: 10 mg via INTRAVENOUS
  Filled 2012-09-03: qty 2

## 2012-09-03 MED ORDER — INSULIN ASPART 100 UNIT/ML ~~LOC~~ SOLN
6.0000 [IU] | Freq: Once | SUBCUTANEOUS | Status: DC
  Filled 2012-09-03: qty 1

## 2012-09-03 MED ORDER — ONDANSETRON HCL 4 MG/2ML IJ SOLN
INTRAMUSCULAR | Status: AC
  Administered 2012-09-03: 4 mg via INTRAVENOUS
  Filled 2012-09-03: qty 2

## 2012-09-03 MED ORDER — ONDANSETRON HCL 4 MG/2ML IJ SOLN: 4.0000 mg | Freq: Once | INTRAMUSCULAR | Status: AC

## 2012-09-03 MED ORDER — FENTANYL CITRATE 0.05 MG/ML IJ SOLN
100.0000 ug | Freq: Once | INTRAMUSCULAR | Status: AC
  Administered 2012-09-03: 100 ug via INTRAVENOUS
  Filled 2012-09-03: qty 2

## 2012-09-03 NOTE — ED Notes (Signed)
IV team unable to obtain iv access, [name] informed

## 2012-09-03 NOTE — ED Provider Notes (Signed)
Pt has had persistent increased blood sugars, higher today than usual, no voimiting, poor IV access, NAD on my exam with soft abd, clear heart and lung sounds.  Angiocath insertion Performed by: Vida Roller  Consent: Verbal consent obtained. Risks and benefits: risks, benefits and alternatives were discussed Time out: Immediately prior to procedure a "time out" was called to verify the correct patient, procedure, equipment, support staff and site/side marked as required.  Preparation: Patient was prepped and draped in the usual sterile fashion.  Vein Location: L EJ  Not Ultrasound Guided  Gauge: 20  Normal blood return and flush without difficulty Patient tolerance: Patient tolerated the procedure well with no immediate complications.  Medical screening examination/treatment/procedure(s) were conducted as a shared visit with non-physician practitioner(s) and myself.  I personally evaluated the patient during the encounter     Vida Roller, MD 09/03/12 2152

## 2012-09-03 NOTE — ED Notes (Signed)
Pt c/o migraine HA today and hyperglycemia; pt sts last dialysis was yesterday

## 2012-09-03 NOTE — ED Notes (Signed)
Unable to obtain IV access, IV team paged.

## 2012-09-03 NOTE — ED Notes (Signed)
Pt does not make urine.

## 2012-09-03 NOTE — ED Notes (Addendum)
Pt states her cbg has been "all over" and has been up to 800. Pt states she is also having a migraine, "but it's just like my other migraines."

## 2012-09-03 NOTE — ED Notes (Signed)
CBG = 447 

## 2012-09-03 NOTE — ED Notes (Signed)
Pt c/o nausea from fentanyl.

## 2012-09-03 NOTE — ED Notes (Addendum)
Patient request waiting on blood draws

## 2012-09-03 NOTE — ED Provider Notes (Signed)
History     CSN: [id number]  Arrival date & time 09/03/12  1018   First MD Initiated Contact with Patient 09/03/12 1133      Chief Complaint  Patient presents with  . Hyperglycemia  . Headache    (Consider location/radiation/quality/duration/timing/severity/associated sxs/prior treatment) HPI Patient presents emergency department elevated blood sugar and headache.  Patient, states both of these have any chronic problems over the last several months.  Patient, states, that she's tried to address this with her physician, but hadn't changed recently to a new Dr. patient denies blurred vision, nausea, vomiting, diarrhea, abdominal pain, chest pain, shortness of breath, fever, weakness, dizziness, syncope, or rash.  Patient, states, that she's taken Excedrin Migraine with minimal relief.  Patient, states, that she is due to have a kidney transplant in the next few months.  Patient, states she has been taking her insulin appropriately, but has not had good control of her blood sugars. Past Medical History  Diagnosis Date  . Hyperlipidemia   . Alopecia   . Obesity   . Iron deficiency   . 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 RLS (restless legs syndrome)   . Respiratory failure   . Injury of conjunctiva and corneal abrasion of right eye without foreign body   . Cellulitis   . Dialysis patient     Tues; Thurs; Sat; [place]  . Angina   . 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 Anemia   . Pneumonia 2012    "double"  . Blood transfusion 2004    "when I had my transplant"  . Migraine   . 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 Hyperparathyroidism, secondary   . Diabetic retinopathy(362.0)   . Hypertension     takes Metoprolol and Exforge daily  . Depression     takes Klonopin nightly  . Diabetes mellitus type 1     Pt states diagnosed at age 89 with  prior episodes of DKA. S/p pancreatic transplant   . GERD (gastroesophageal reflux disease)     takes Protonix daily  . PONV (postoperative nausea and vomiting)   . Bronchitis   . Migraine   . Asthma   . Shortness of breath   . Emphysema of lung   . DKA (diabetic ketoacidoses) 07/02/2012    Past Surgical History  Procedure Laterality Date  . 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 Thyroglobulin      x 7  . Av fistula placement      right upper arm  . Cholecystectomy  1995  .  hd graft placement/removal      "had 2 in my left upper arm"  . Eye surgery    . Retinopathy surgery    . Tooth extraction  10/10/11    X's two  . Insertion of dialysis catheter  12/08/2011    Procedure: INSERTION OF DIALYSIS CATHETER;  Surgeon: Chuck Hint, MD;  Location: [facility] OR;  Service: Vascular;  Laterality: Left;  . Av fistula placement  01/22/2012    Procedure: INSERTION OF ARTERIOVENOUS (AV) GORE-TEX GRAFT ARM;  Surgeon: Chuck Hint, MD;  Location: [facility] OR;  Service: Vascular;  Laterality: Left;  Ultrasound guided.  . Av fistula placement  03/14/2012    Procedure: INSERTION OF ARTERIOVENOUS (AV) GORE-TEX GRAFT THIGH;  Surgeon: Cristal Deer  Adele Dan, MD;  Location: [facility] OR;  Service: Vascular;  Laterality: Right;    Family History  Problem Relation Age of Onset  . Hypertension Mother   . Kidney disease Mother   . Diabetes Mother   . Hypertension Father   . Kidney disease Father   . Diabetes Father     History  Substance Use Topics  . Smoking status: Never Smoker   . Smokeless tobacco: Never Used  . Alcohol Use: No    OB History   Grav Para Term Preterm Abortions TAB SAB Ect Mult Living                  Review of Systems All other systems negative except as documented in the HPI. All pertinent positives and negatives as reviewed in the HPI. Allergies  Morphine and related; Penicillins; Tramadol; Depakote; and Vicodin  Home  Medications   Current Outpatient Rx  Name  Route  Sig  Dispense  Refill  . aspirin EC 81 MG tablet   Oral   Take 81 mg by mouth daily.         . Aspirin-Acetaminophen-Caffeine (EXCEDRIN MIGRAINE PO)   Oral   Take 2 tablets by mouth every 3 (three) hours as needed (headache).         . calcium acetate (PHOSLO) 667 MG capsule   Oral   Take 2,001-2,668 mg by mouth 3 (three) times daily with meals. Dosage depends on meal consumed         . clonazePAM (KLONOPIN) 0.5 MG tablet   Oral   Take 1 mg by mouth at bedtime.          Marland Kitchen EXFORGE 10-160 MG per tablet   Oral   Take 1 tablet by mouth at bedtime.          Marland Kitchen guaiFENesin (MUCINEX) 600 MG 12 hr tablet   Oral   Take 1 tablet (600 mg total) by mouth 2 (two) times daily.   30 tablet   0   . Insulin Aspart (NOVOLOG FLEXPEN [place])   Subcutaneous   Inject 3-6 Units into the skin 3 (three) times daily before meals. Takes 6 units in am and at night, if pt eats lunch she will take insulin before she eats         . insulin glargine (LANTUS) 100 units/mL SOLN   Subcutaneous   Inject 7 Units into the skin 2 (two) times daily.         . metoprolol succinate (TOPROL-XL) 100 MG 24 hr tablet   Oral   Take 100 mg by mouth daily. Take with or immediately following a meal.         . mycophenolate (CELLCEPT) 250 MG capsule   Oral   Take 250 mg by mouth 2 (two) times daily.         . pantoprazole (PROTONIX) 40 MG tablet   Oral   Take 40 mg by mouth daily.         Marland Kitchen Phenylephrine-Bromphen-DM (RA COLD/COUGH DM PO)   Oral   Take 2 tablets by mouth every 4 (four) hours as needed (cold & cough).         . predniSONE (DELTASONE) 5 MG tablet   Oral   Take 1 tablet by mouth daily.         . tacrolimus (PROGRAF) 1 MG capsule   Oral   Take 1 mg by mouth 2 (two) times daily.         Marland Kitchen  zolpidem (AMBIEN) 10 MG tablet   Oral   Take 10 mg by mouth at bedtime. For insomnia.         Marland Kitchen albuterol (PROVENTIL HFA;VENTOLIN  HFA) 108 (90 BASE) MCG/ACT inhaler   Inhalation   Inhale 2 puffs into the lungs every 4 (four) hours as needed. For wheezing.          Marland Kitchen albuterol (PROVENTIL) (5 MG/ML) 0.5% nebulizer solution   Nebulization   Take 0.5 mLs (2.5 mg total) by nebulization 3 (three) times daily.   20 mL   0     BP 180/50  Pulse 85  Temp(Src) 98.2 F (36.8 C) (Oral)  Resp 16  SpO2 100%  Physical Exam  Nursing note and vitals reviewed. Constitutional: She is oriented to person, place, and time. She appears well-developed and well-nourished. No distress.  HENT:  Head: Normocephalic and atraumatic.  Mouth/Throat: Oropharynx is clear and moist.  Eyes: Pupils are equal, round, and reactive to light.  Neck: Normal range of motion. Neck supple.  Cardiovascular: Normal rate, regular rhythm and normal heart sounds.  Exam reveals no gallop and no friction rub.   No murmur heard. Pulmonary/Chest: Effort normal and breath sounds normal. No respiratory distress.  Abdominal: Soft. Bowel sounds are normal. She exhibits no distension. There is no guarding.  Lymphadenopathy:    She has no cervical adenopathy.  Neurological: She is alert and oriented to person, place, and time. She exhibits normal muscle tone. Coordination normal.  Skin: Skin is warm and dry.  Psychiatric: She has a normal mood and affect. Her behavior is normal. Judgment and thought content normal.    ED Course  Procedures (including critical care time)  Labs Reviewed  CBC WITH DIFFERENTIAL - Abnormal; Notable for the following:    RBC 3.43 (*)    Hemoglobin 10.8 (*)    HCT 32.7 (*)    All other components within normal limits  GLUCOSE, CAPILLARY - Abnormal; Notable for the following:    Glucose-Capillary 439 (*)    All other components within normal limits  GLUCOSE, CAPILLARY - Abnormal; Notable for the following:    Glucose-Capillary 447 (*)    All other components within normal limits  COMPREHENSIVE METABOLIC PANEL - Abnormal;  Notable for the following:    Sodium 132 (*)    Chloride 93 (*)    Glucose, Bld 358 (*)    BUN 36 (*)    Creatinine, Ser 6.62 (*)    Albumin 3.3 (*)    GFR calc non Af Amer 7 (*)    GFR calc Af Amer 8 (*)    All other components within normal limits  GLUCOSE, CAPILLARY - Abnormal; Notable for the following:    Glucose-Capillary 282 (*)    All other components within normal limits  POCT I-STAT 3, BLOOD GAS (G3P V) - Abnormal; Notable for the following:    pH, Ven 7.446 (*)    pCO2, Ven 38.3 (*)    pO2, Ven 29.0 (*)    Bicarbonate 26.3 (*)    All other components within normal limits  POCT I-STAT 3, BLOOD GAS (G3+) - Abnormal; Notable for the following:    pO2, Arterial 59.0 (*)    Bicarbonate 25.0 (*)    All other components within normal limits   Patient has been stable here in the emergency department.  She does not have any signs of DKA.  Her blood sugars responded well to insulin, and fluids.  Patient be referred back  to her primary care Dr. told to return here for any worsening in her condition   MDM  MDM Reviewed: vitals and nursing note Interpretation: labs   patient's ER visit as been extended due to the fact she had access issues and some of her blood testing became hemolyzed.          Carlyle Dolly, PA-C 09/03/12 [phone]

## 2012-09-03 NOTE — ED Notes (Signed)
Patient no longer makes urine.  rn-julie informed.

## 2012-09-28 IMAGING — CR DG CHEST 2V
2 series · 2 of 2 positions shown · non-contrast
Comparison: Chest radiograph performed 08/04/2010

CLINICAL DATA: Shortness of breath, cough, nausea, vomiting and
diarrhea.

CHEST - 2 VIEW

[w chest pa]
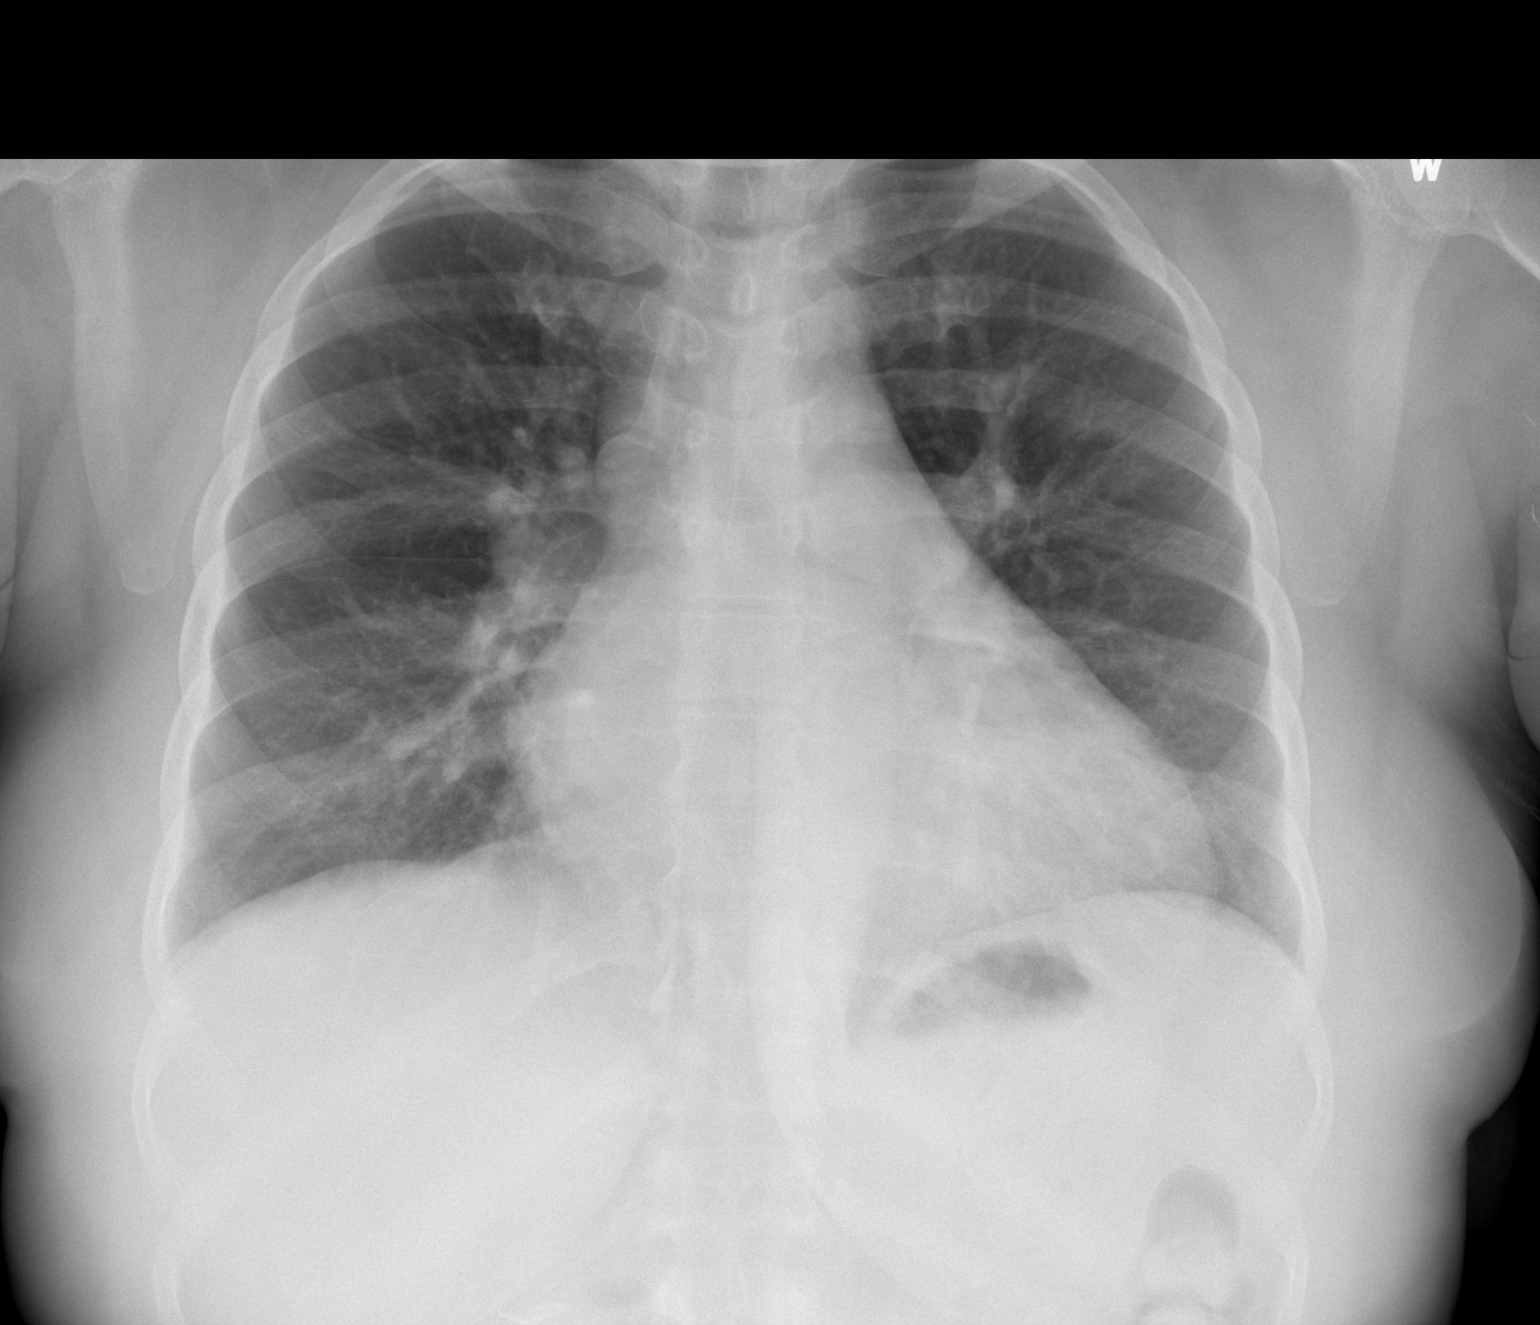

[w chest lat]
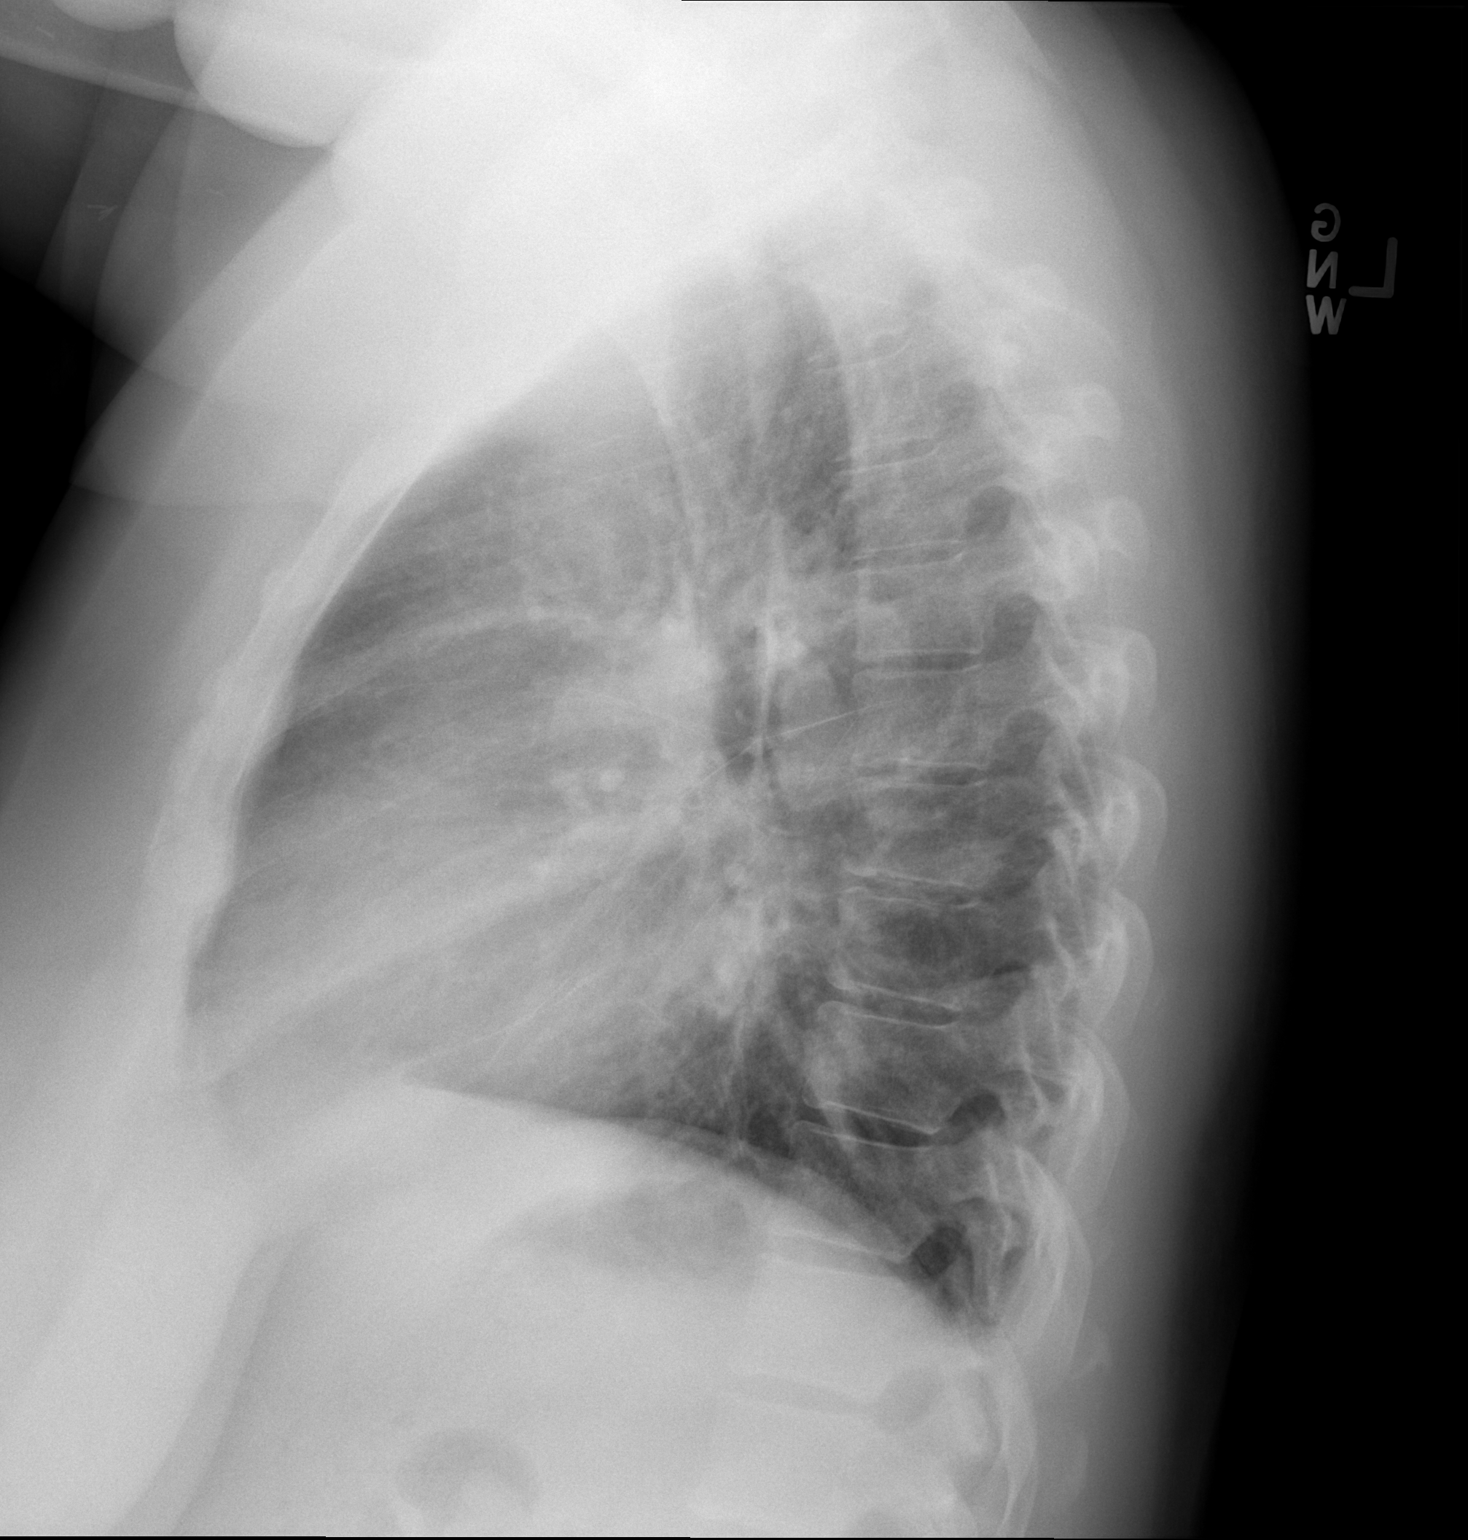

[2 of 2 positions shown; findings below may reference images not displayed]

FINDINGS: The lungs are well-aerated.  Mild vascular congestion is
noted.  There is no evidence of focal opacification, pleural
effusion or pneumothorax.

The heart is mildly enlarged; the mediastinal contour is within
normal limits.  No acute osseous abnormalities are seen.
IMPRESSION: 1.  Lungs clear.
2.  Mild vascular congestion and mild cardiomegaly.

## 2012-09-29 IMAGING — CT CT ABD-PELV W/O CM
2 of 4 series · 13 of 32 positions shown, 18 images · non-contrast
Comparison: CT of the abdomen and pelvis performed 12/17/2008

***ADDENDUM*** CREATED: 09/08/2010 [DATE]

Initial dictation by Dr.Priyakshi.  Addendum by Dr. Woldu.  After
discussion with Dr. Ocmin, we are aware that the patient has
undergone prior renal and pancreatic transplant.  The pancreatic
transplant is identified on image 43 of series 2.  The renal
transplant is identified within the left iliac fossa on image 53 of
series 2.  The renal transplant has undergone interval atrophy
since 01/27/10.  No gross hydronephrosis is identified.  No
evidence of adnexal mass.
CLINICAL DATA: Left-sided abdominal pain and back pain; nausea and
vomiting.
CT ABDOMEN AND PELVIS WITHOUT CONTRAST
TECHNIQUE: Multidetector CT imaging of the abdomen and pelvis was
performed following the standard protocol without intravenous
contrast.

[Series 2: routine abdomen · axial · 0.75mm/px · z∈[-369,-69]mm · 6 of 85 slices shown, 11 images]
[im 13/85  soft-tissue]
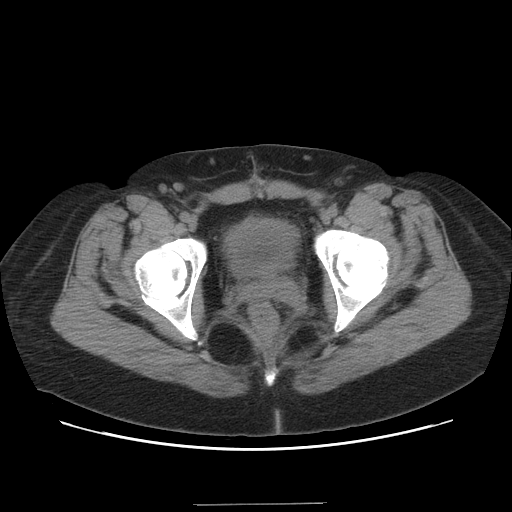
[im 13/85  bone]
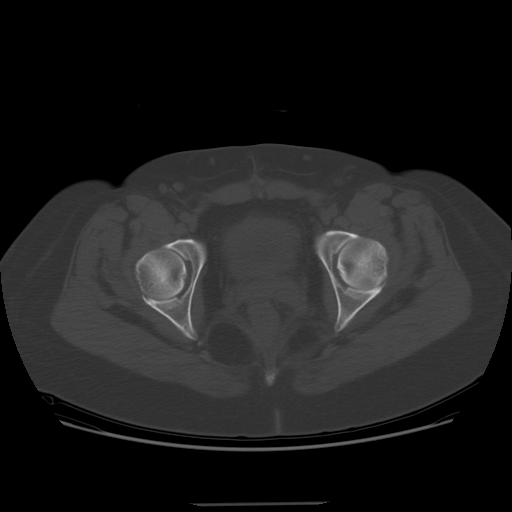
[im 25/85  soft-tissue]
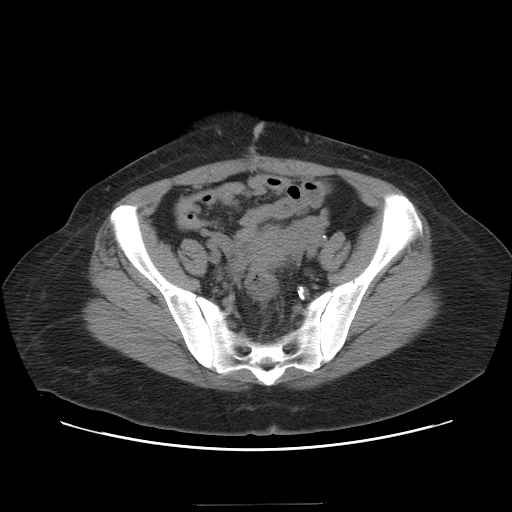
[im 37/85  soft-tissue]
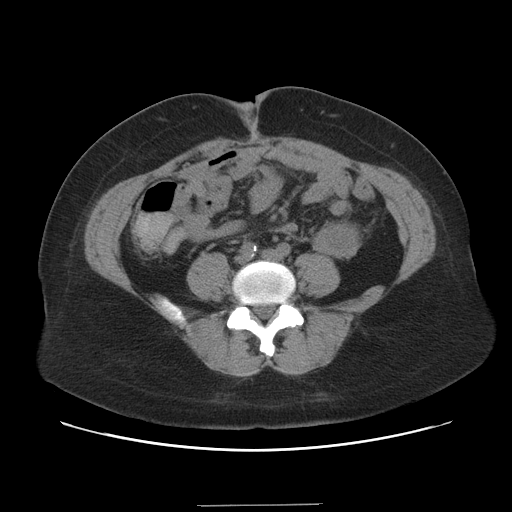
[im 37/85  lung]
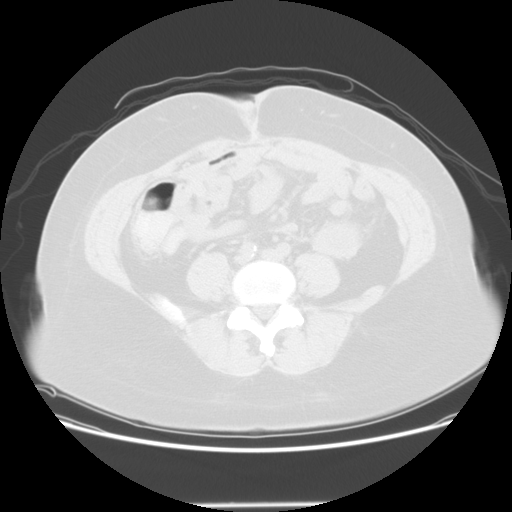
[im 49/85  soft-tissue]
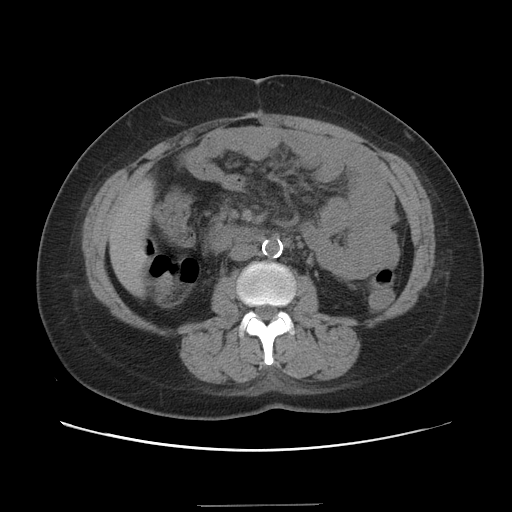
[im 49/85  lung]
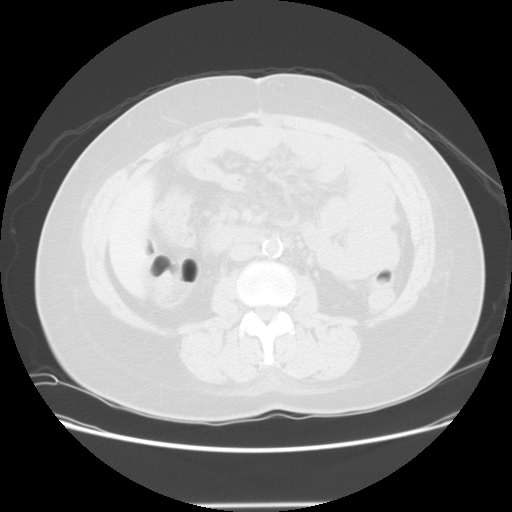
[im 61/85  soft-tissue]
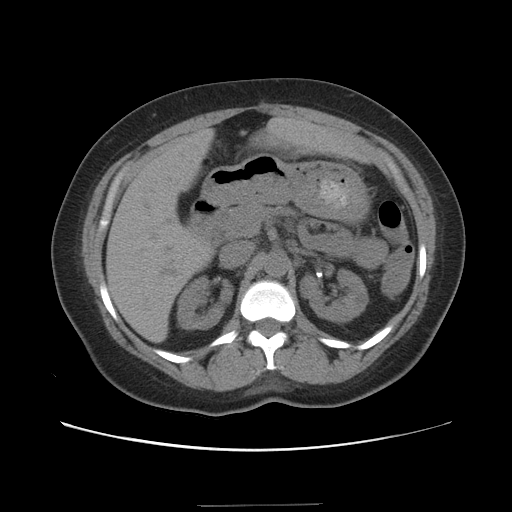
[im 61/85  lung]
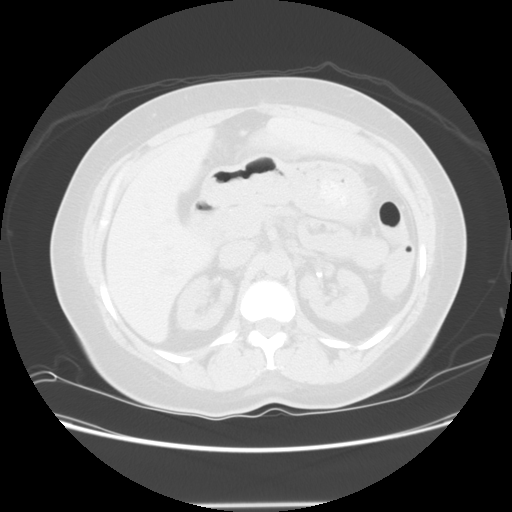
[im 73/85  soft-tissue]
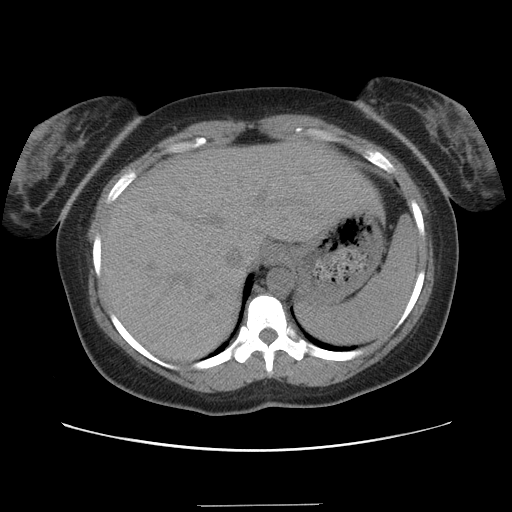
[im 73/85  lung]
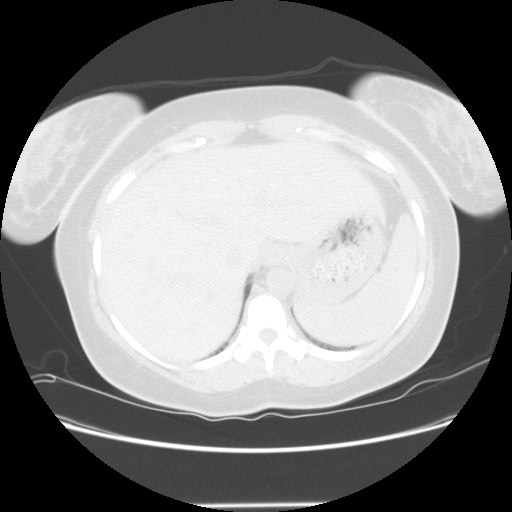

[Series 400: sag · sagittal · 0.92mm/px · 7 of 115 slices shown]
[im 12/115  soft-tissue]
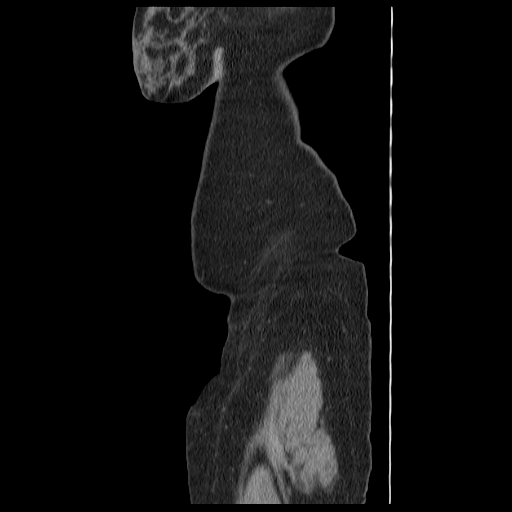
[im 23/115  soft-tissue]
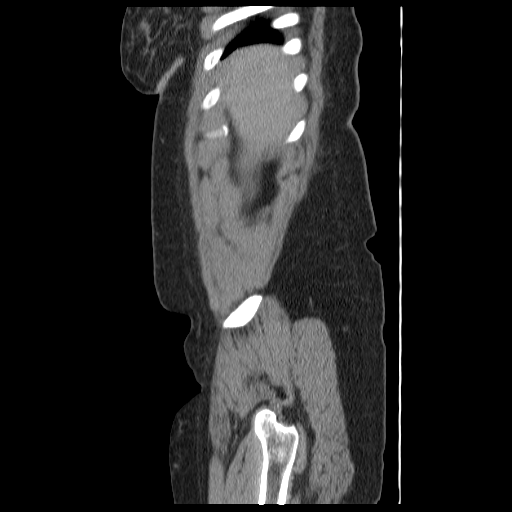
[im 35/115  soft-tissue]
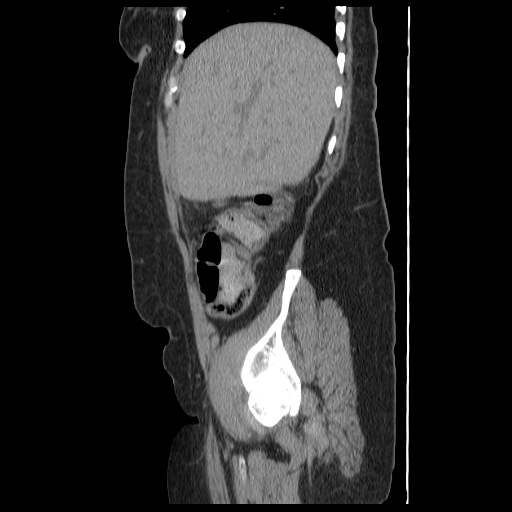
[im 46/115  soft-tissue]
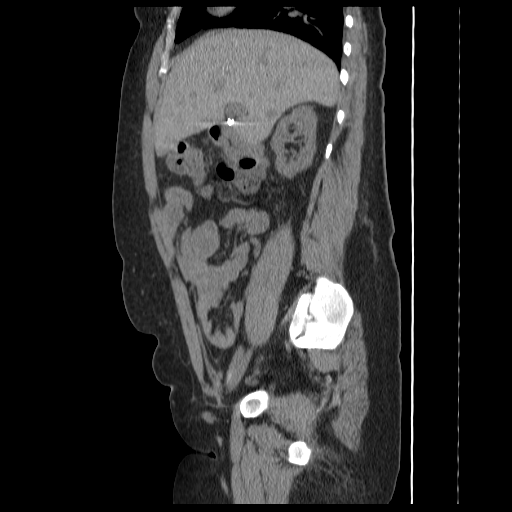
[im 69/115  soft-tissue]
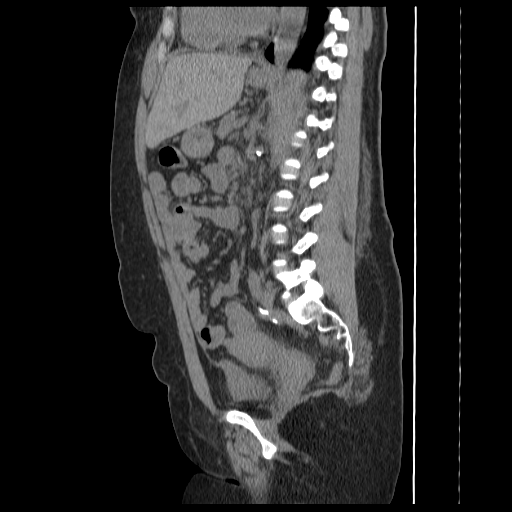
[im 80/115  soft-tissue]
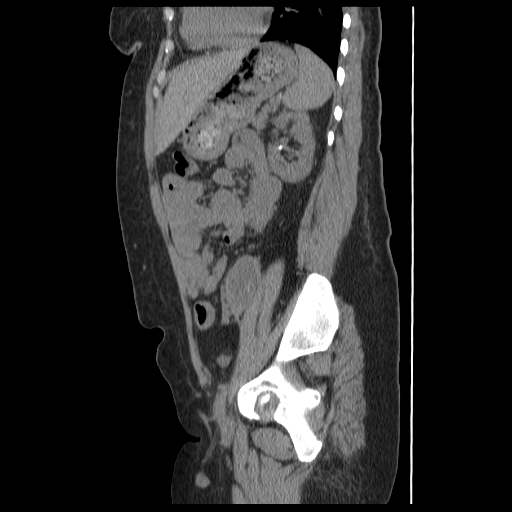
[im 92/115  soft-tissue]
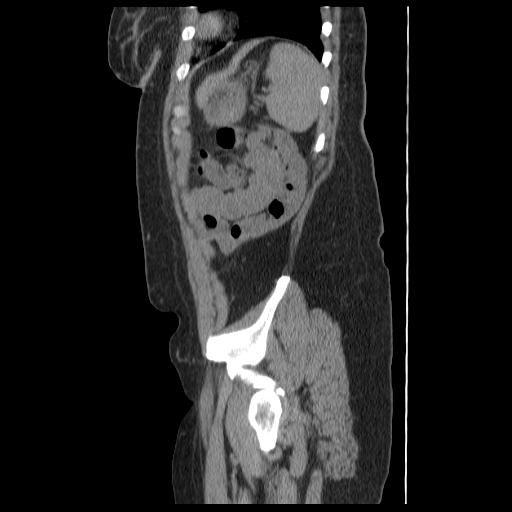

[13 of 32 positions shown; findings below may reference images not displayed]

FINDINGS: The visualized lung bases are clear.

The liver and spleen are unremarkable in appearance. The patient is
status post cholecystectomy, with clips noted at the gallbladder
fossa.  The pancreas and adrenal glands are unremarkable.

Both kidneys are atrophic, with scattered small hypodensities
likely reflecting small cysts.  No dominant mass is seen.
Scattered calcifications at the renal hila appear to be vascular in
nature; minimal small stones are noted at the left renal calyces.
There is no evidence of hydronephrosis.  No significant perinephric
stranding is seen.

No free fluid is identified.  The small bowel is unremarkable in
appearance.  The stomach is within normal limits.  No acute
vascular abnormalities are seen. Diffuse calcification is noted
along the abdominal aorta and its branches, advanced for age.

The appendix is normal in caliber, without evidence for
appendicitis.  The colon is unremarkable in appearance.

The bladder is largely decompressed and not well assessed.  The
uterus is unremarkable in appearance. There is asymmetric
enlargement of the left ovary, with an associated 4.3 cm
hypoattenuating cystic structure adjacent to the left ovary.
Pelvic ultrasound would be helpful for further evaluation, as
deemed clinically appropriate.  A very small right inguinal hernia
is noted, containing only fat.  No inguinal lymphadenopathy is
seen.

No acute osseous abnormalities are identified.
IMPRESSION: 1.  4.3 cm cystic structure adjacent to the left ovary, with
asymmetric enlargement of the left ovary.  Pelvic ultrasound would
be helpful for further evaluation, as deemed clinically
appropriate.
2.  Bilateral renal atrophy, with numerous small cysts; no dominant
mass seen.  Minimal small stones noted at the left renal calyces.
3.  Diffuse calcification along the abdominal aorta and its
branches, advanced for age.

4.  Very small right inguinal hernia, containing only fat.

## 2012-10-05 IMAGING — XA IR US GUIDE VASC ACCESS LEFT
1 series · 5 of 5 positions shown · non-contrast
Comparison: none

CLINICAL HISTORY: 39-year with end-stage renal disease.  The patient
needs IV access for a CT scan and blood draws.

[Series 300: sp fluoro guide cv line*l* · 5 of 5 slices shown]
[im 1/5]
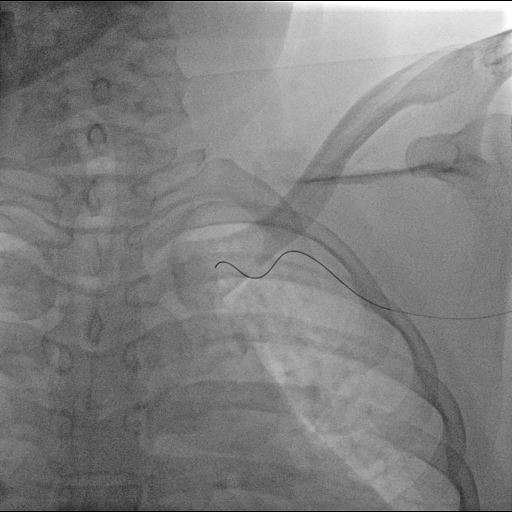
[im 2/5]
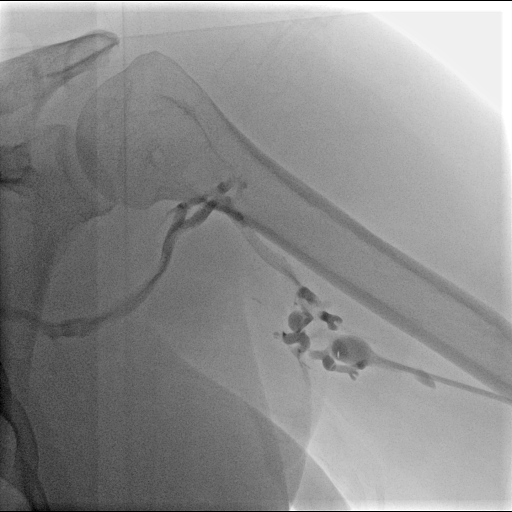
[im 3/5]
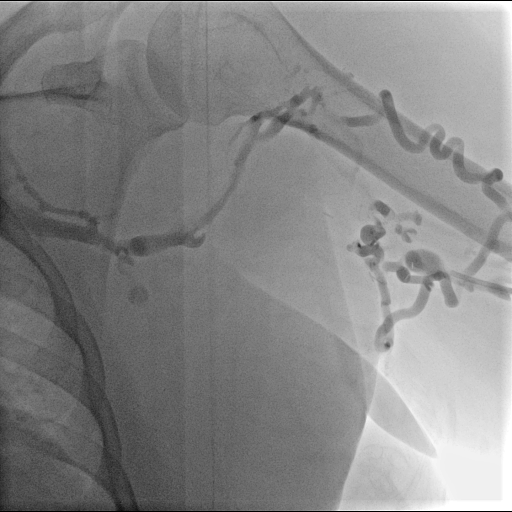
[im 4/5]
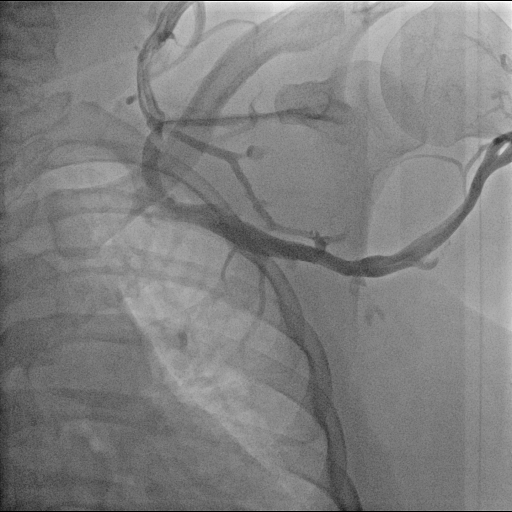
[im 5/5]
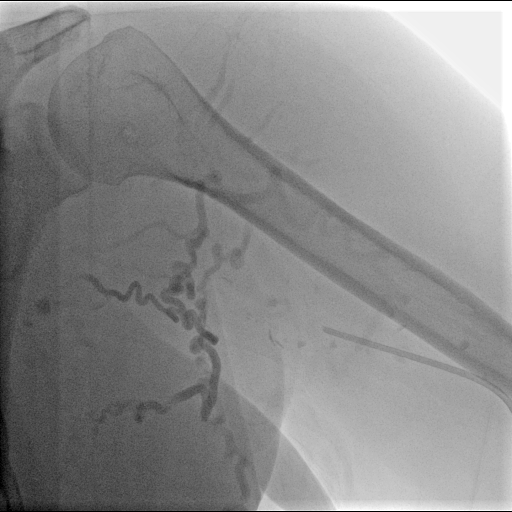

[5 of 5 positions shown; findings below may reference images not displayed]

PROCEDURE(S): PLACEMENT OF A PERIPHERAL VENOUS CATHETER WITH
ULTRASOUND AND FLUOROSCOPIC GUIDANCE

Medications:None

Moderate sedation time:None

Fluoroscopy time: 2.7 minutes

Procedure:The procedure was explained to the patient.  The risks
and benefits of the procedure were discussed and the patient's
questions were addressed.  Informed consent was obtained from the
patient.  The left arm was evaluated with ultrasound.  The patient
had a patent left brachial vein.  Arm was prepped and draped in a
sterile fashion.  Skin was anesthetized with lidocaine.  A 21 gauge
needle was directed into the brachial vein with ultrasound
guidance.  Wire did advance into the left subclavian region but
would not advance medial to the left clavicle.  The peel-away
sheath was placed.  The catheter would not advance beyond the left
axilla.  Small amount of  contrast was injected to evaluate the
venous system.   Dual lumen PICC line was cut to 9 cm catheter and
left within the left brachial vein.  Catheter aspirated and flushed
well.  Catheter sutured to the skin with Prolene suture.
FINDINGS: There is occlusion of the left upper arm brachial vein.
The left axillary vein is patent but there is occlusion of the
medial left subclavian vein. Catheter tip was left in the left
brachial vein.

Complications: None
IMPRESSION: Placement of a left arm intravenous catheter.

Catheter could not be advanced into the central venous system due
to the venous occlusions.

## 2012-10-05 IMAGING — CT CT ABD-PELV W/ CM
2 of 5 series · 13 of 32 positions shown, 18 images · IV contrast (omnipaque)
Comparison: 09/08/2010

CLINICAL DATA: Nausea and vomiting.  Body aches.  Shortness of
breath.  Left flank pain.  History of diabetes, hypertension,
pancreatic transplant, renal transplant, and dialysis.

CT ABDOMEN AND PELVIS WITH CONTRAST
TECHNIQUE: Multidetector CT imaging of the abdomen and pelvis was
performed following the standard protocol during bolus
administration of intravenous contrast.
Contrast: 100 ml Omnipaque 300

[Series 2: routine abdomen · axial · 0.79mm/px · z∈[-428,-108]mm · 6 of 90 slices shown, 11 images]
[im 13/90  soft-tissue]
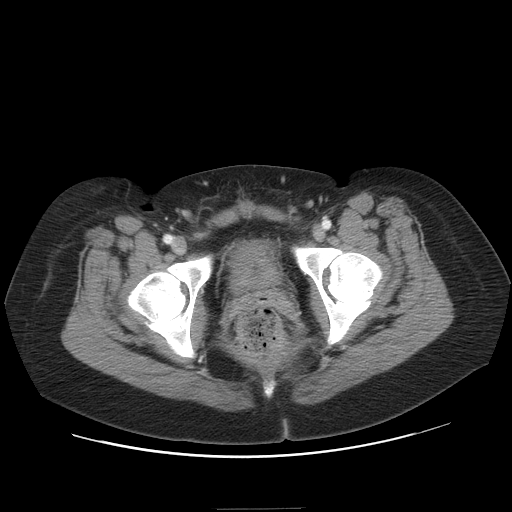
[im 13/90  bone]
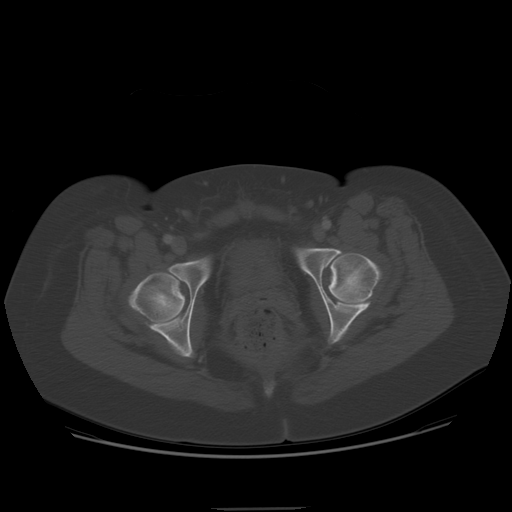
[im 26/90  soft-tissue]
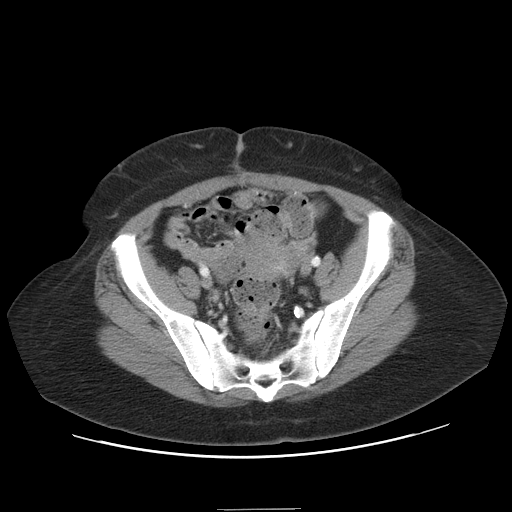
[im 39/90  soft-tissue]
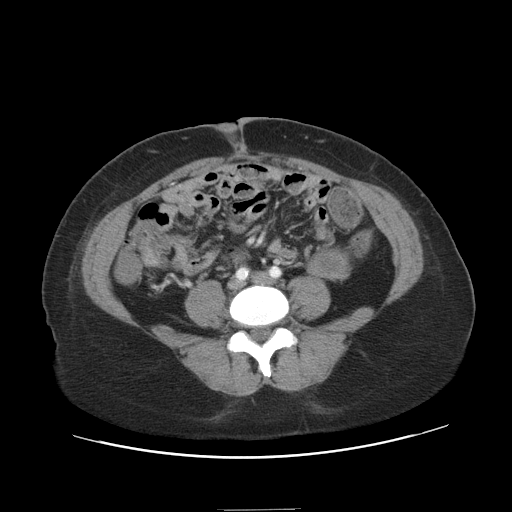
[im 39/90  lung]
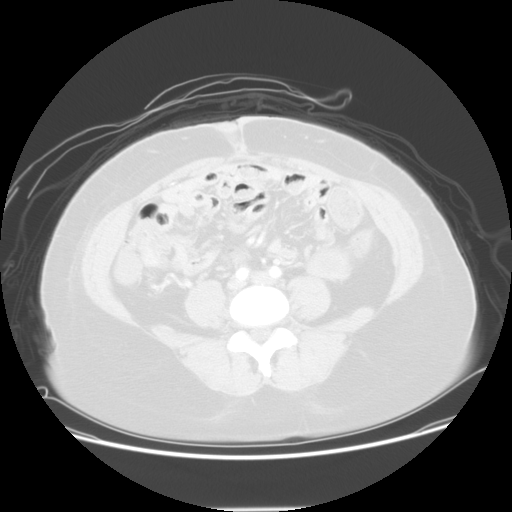
[im 51/90  soft-tissue]
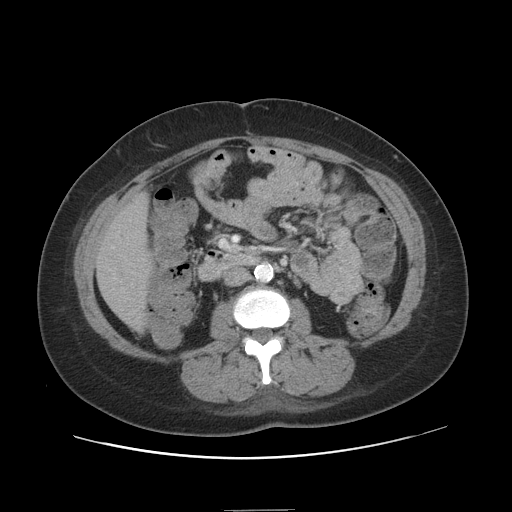
[im 51/90  lung]
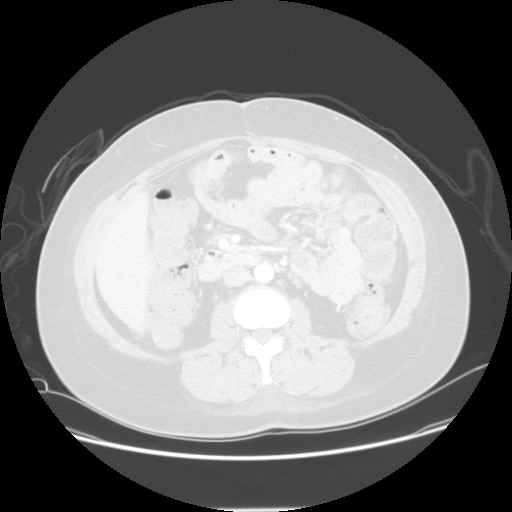
[im 64/90  soft-tissue]
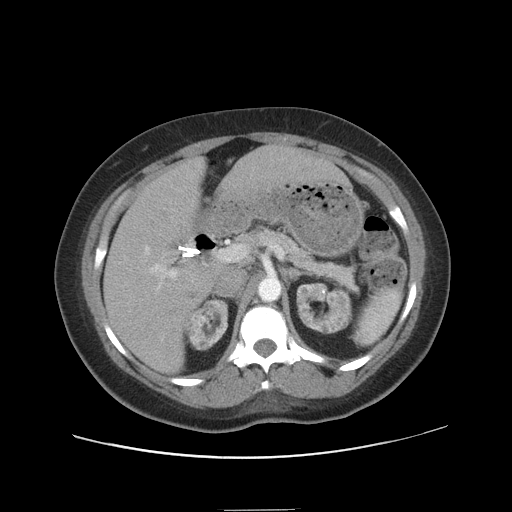
[im 64/90  lung]
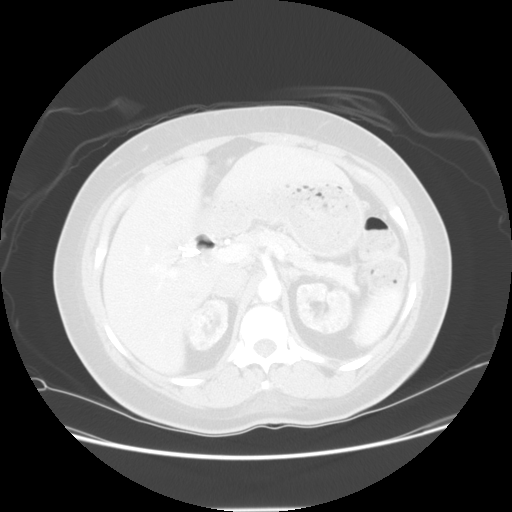
[im 77/90  soft-tissue]
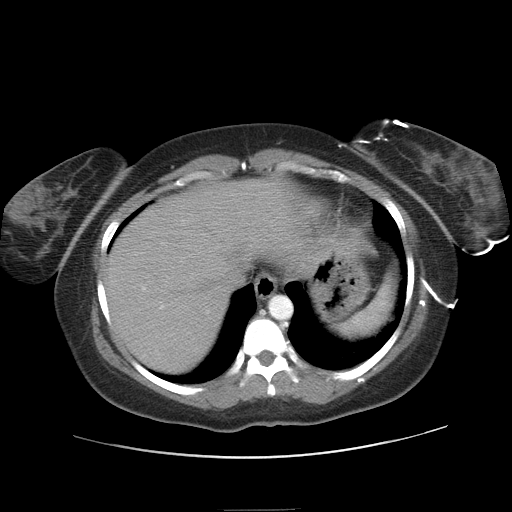
[im 77/90  lung]
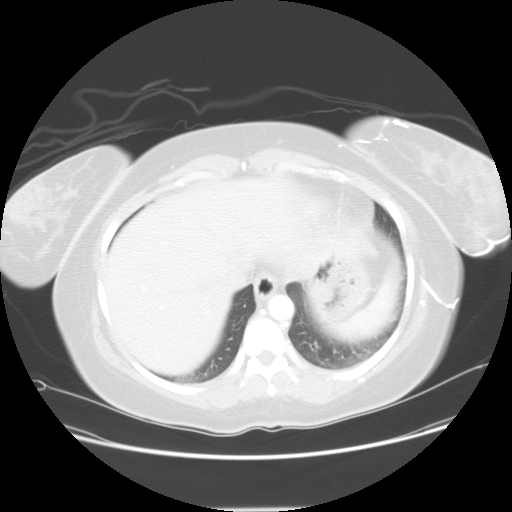

[Series 401: sag · sagittal · 0.92mm/px · 7 of 108 slices shown]
[im 11/108  soft-tissue]
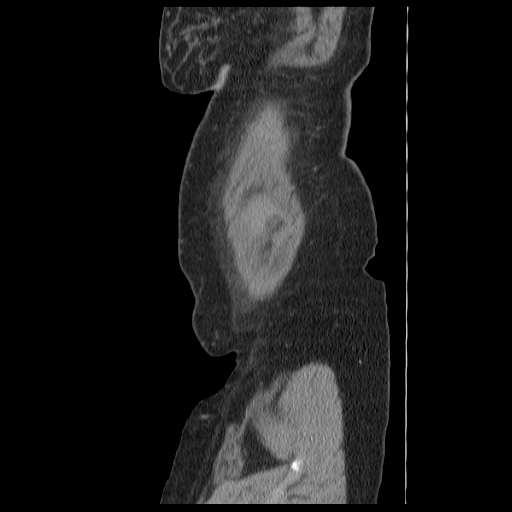
[im 22/108  soft-tissue]
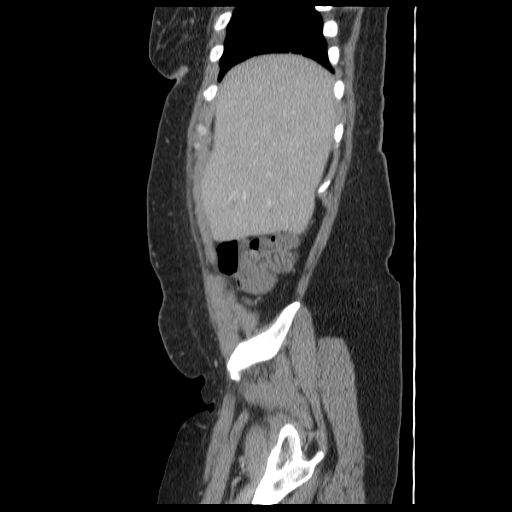
[im 33/108  soft-tissue]
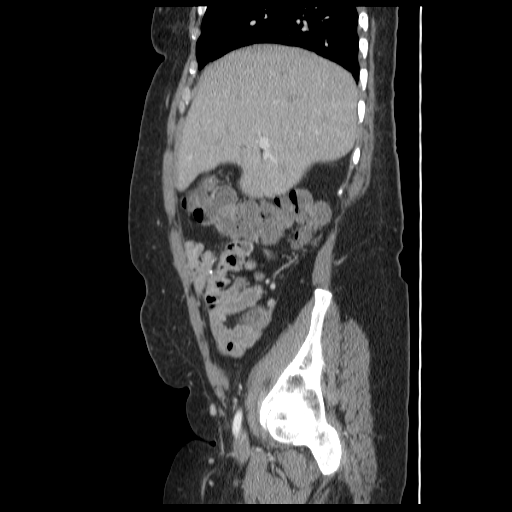
[im 43/108  soft-tissue]
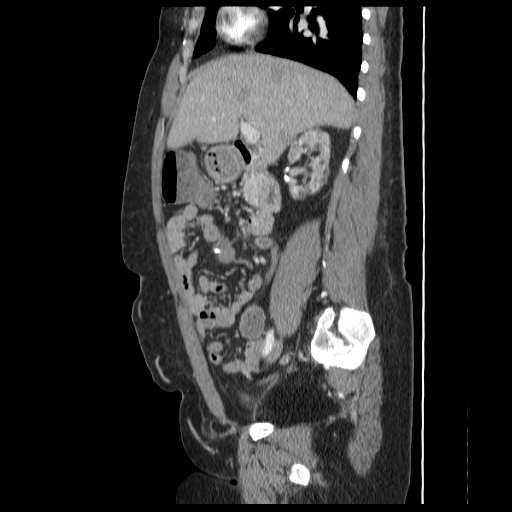
[im 65/108  soft-tissue]
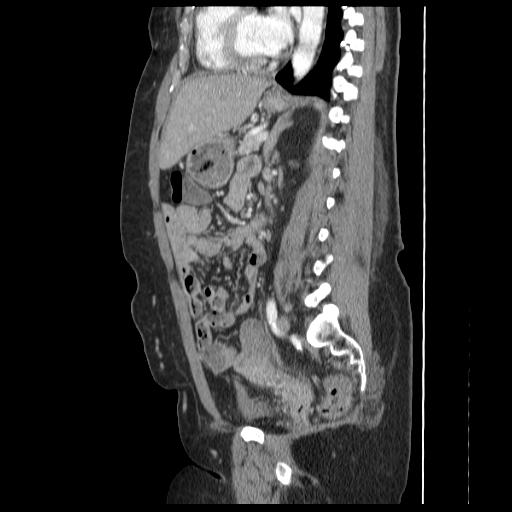
[im 75/108  soft-tissue]
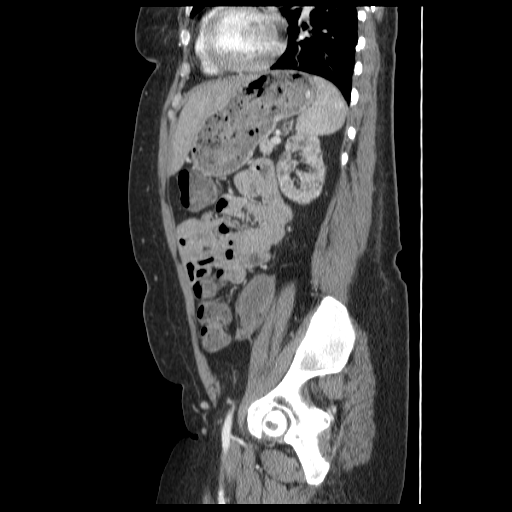
[im 86/108  soft-tissue]
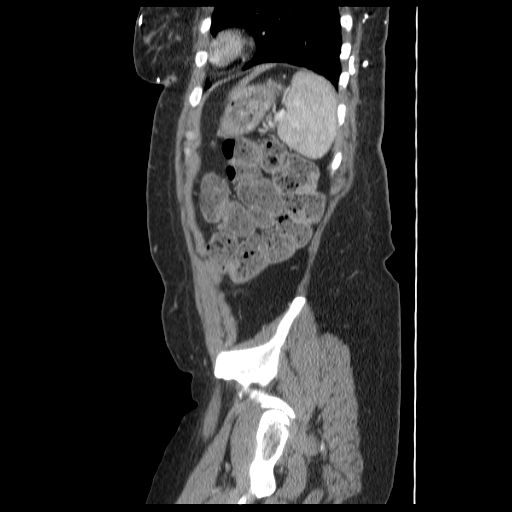

[13 of 32 positions shown; findings below may reference images not displayed]

FINDINGS: Mild dependent atelectasis in the lung bases.  Contrast
material is seen within the superficial and subcutaneous collateral
vessels  in the left chest.  Presuming contrast injection into the
left antecubital fossa, this could suggest possibility of
subclavian or axillary vein stenosis or compromise.

Surgical absence of the gallbladder.  The liver, spleen, pancreas,
bile ducts, and adrenal glands are unremarkable.  The kidneys are
atrophic and contains multiple small cyst diffusely throughout the
renal cortex bilaterally.  Calcifications in both kidneys which
might represent vascular calcifications.  No hydronephrosis.
Limited, if any, renal function demonstrated on delayed images.
Calcification of nonaneurysmal abdominal aorta.  Calcification in
the origins of the renal arteries and SMA.  Distal superior
mesenteric vessels are patent.  Small accessory spleen.  The
stomach is unremarkable for degree of distension.  No small bowel
dilatation.  3 cm diameter rounded hypodense structure in the right
mid abdomen with surgical clips present consistent with the
pancreatic transplant.  This is stable since the prior study.  No
abdominal ascites or free air.  Scarring in the umbilical region,
likely postoperative.

Pelvis:  No free or loculated pelvic fluid collections.  The
bladder is decompressed but suggests prominent diffuse bladder wall
thickening.  This could represent cystitis or bladder hypertrophy.
The appearance is similar to the prior study.  The uterus is
anteverted.  Uterus and adnexal structures are not significantly
enlarged.  Soft tissue structure demonstrated in the left pelvis
consistent with the renal transplant.  No significant function is
suggested.  This is stable since the prior study.  The appendix is
normal.  Visualized bones are unremarkable.
IMPRESSION: Postoperative structures identified consistent with history of
pancreatic and renal transplants.  Atrophic and cyst filled
kidneys.  No renal excretion demonstrated.  No abdominal abscess or
inflammatory stranding demonstrated.  Despite the decompressed
bladder, there is bladder wall thickening which could represent
cystitis or bladder hypertrophy.  IV contrast material fills
superficial subcutaneous structures on the left suggesting
occlusion or stenosis of the left subclavian or axillary vein.

## 2012-10-06 ENCOUNTER — Ambulatory Visit: Payer: Medicare Other | Admitting: Internal Medicine

## 2012-10-29 ENCOUNTER — Other Ambulatory Visit (HOSPITAL_COMMUNITY): Payer: Self-pay | Admitting: Obstetrics

## 2012-10-29 ENCOUNTER — Other Ambulatory Visit: Payer: Self-pay | Admitting: Obstetrics

## 2012-10-29 DIAGNOSIS — Z1231 Encounter for screening mammogram for malignant neoplasm of breast: Secondary | ICD-10-CM

## 2012-10-29 DIAGNOSIS — N83209 Unspecified ovarian cyst, unspecified side: Secondary | ICD-10-CM

## 2012-11-03 ENCOUNTER — Encounter: Payer: Self-pay | Admitting: Internal Medicine

## 2012-11-03 ENCOUNTER — Ambulatory Visit (INDEPENDENT_AMBULATORY_CARE_PROVIDER_SITE_OTHER): Payer: Medicare Other | Admitting: Internal Medicine

## 2012-11-03 VITALS — BP 140/80 | HR 77 | Temp 96.5°F | Ht 60.25 in | Wt 164.8 lb

## 2012-11-03 DIAGNOSIS — R05 Cough: Secondary | ICD-10-CM

## 2012-11-03 MED ORDER — OXYCODONE-ACETAMINOPHEN 5-325 MG PO TABS: 1.0000 | ORAL_TABLET | ORAL | Status: DC | PRN

## 2012-11-03 NOTE — Patient Instructions (Addendum)
Take delsym two tsp every 12 hours and supplement if needed with  Percocet up to 1 every 4 hours to suppress the urge to cough. Swallowing water or using ice chips/non mint and menthol containing candies (such as lifesavers or sugarless jolly ranchers) are also effective.  You should rest your voice and avoid activities that you know make you cough.  Once you have eliminated the cough for 3 straight days stop percocet first,  then the delsym as tolerated.    Pantoprazole (protonix) 40 mg   Take 30-60 min before first meal of the day and Pepcid 20 mg one bedtime for at least a month and if not improving within 2 weeks > See Tammy NP  with all your medications, even over the counter meds, separated in two separate bags, the ones you take no matter what vs the ones you stop once you feel better and take only as needed when you feel you need them.   Tammy  will generate for you a new user friendly medication calendar that will put [place] all on the same page re: your medication use.     Without this process, it simply isn't possible to assure that we are providing  your outpatient care  with  the attention to detail we feel you deserve.   If we cannot assure that you're getting that kind of care,  then we cannot manage your problem effectively from this clinic.  Once you have seen Tammy and we are sure that we're all on the same page with your medication use she will arrange follow up with me.

## 2012-11-03 NOTE — Progress Notes (Signed)
Subjective:    Patient ID: [name], female    DOB: [birth year], 42 y.o.   MRN: [id number]    Brief patient profile:  42 yobf never smoker never asthma on HD since 2008  referred by Dr Caryn Section for  Chronic cough and sob in Oct 2012  03/23/2011 Initial pulmonary office eval  on ACEI  cc cough x 6 months admitted twice with dx of pneumonia but cough never resolved worse when lie down does not tend to wake up mostly dry. 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   Assoc with chest discomfort worsened with coughing. Mild assoc nasal congestion/ sneezing but no purulent secretions, sinus pain, tooth problems, overt hb. Better only with narcotics. No previous hx of seasonal rhinitis overt HB or allergies/ asthma.  rec Stop lisinopril and norvasc Start exforge 10/160 one daily  GERD (REFLUX)   04/23/2011 f/u ov/[name] back on ace x one week cc patient states having pain up under her right breast productive of minimal white mucus, slt feverish over weekend with assoc wheezing,  Better on day of ov s rx better with albuterol.   No overt sinus or hb symptoms rec Add pepcid 20mg  one at bedtime and continue the protonix 40 mg Take 30-60 min before first meal of the day  GERD diet Only use the proiare if you can't catch your breath Use delsym for cough   09/28/2011 f/u ov/[name] :  No show  10/17/2011 f/u ov/[name] No show  11/28/2011 f/u ov/[name] No Show   11/03/2012 f/u ov/[name] re  Cough/sob baseline only saba a few times a month not really sure of all her meds Chief Complaint  Patient presents with  . Acute Visit    Increased SOB for the past 3 months. She also c/o increased cough, sometimes to the point of vomiting.    using albuterol up to 4 x daily but not at all on day of ov assoc dry cough x 3 months, coughs so hard she vomits.  Sleeping ok without nocturnal  or early am exacerbation  of respiratory  c/o's or need for noct saba. Also denies any  obvious fluctuation of symptoms with weather or environmental changes or other aggravating or alleviating factors except as outlined above  ROS  At present neg for  any significant sore throat, dysphagia, itching, sneezing,  nasal congestion or excess pnds but no purulent secretions,  fever, chills, sweats, unintended wt loss, pleuritic or exertional cp, hempoptysis, orthopnea pnd or leg swelling.  Also denies presyncope, palpitations, heartburn, abdominal pain, nausea, vomiting, diarrhea  or change in bowel or urinary habits, dysuria,hematuria,  rash, arthralgias, visual complaints, headache, numbness weakness or ataxia.        Objective:   Physical Exam  Still  hoarse pleasant mod amb obese bf nad with minimal pseudowheeze  Wt 160 03/23/11 >  161 04/23/2011 >  11/03/2012  165  HEENT: nl dentition, turbinates, and orophanx. Nl external ear canals without cough reflex   NECK :  without JVD/Nodes/TM/ nl carotid upstrokes bilaterally   LUNGS: no acc muscle use, completley  clear to A and P bilaterally without cough on insp or exp maneuvers   CV:  RRR  no s3 or murmur or increase in P2, no edema   ABD:  soft and nontender with nl excursion in the supine position. No bruits or organomegaly, bowel sounds nl  MS:  warm without deformities, calf tenderness, cyanosis or clubbing  cxr 02/27/11 Comparison: February 24, 2011  Findings: There is cardiomegaly and mild pulmonary edema. No gross  pleural effusions or focal infiltrates. The right IJ central line  tip is at the superior right atrial level. No pneumothorax.  IMPRESSION:  Mild pulmonary edema.  [facility] "in the last few weeks" = "ok"     Assessment & Plan:

## 2012-11-04 ENCOUNTER — Ambulatory Visit (HOSPITAL_COMMUNITY)
Admission: RE | Admit: 2012-11-04 | Discharge: 2012-11-04 | Disposition: A | Discharge status: Home / Self Care | Payer: Medicare Other | Source: Ambulatory Visit | Attending: Obstetrics | Admitting: Obstetrics

## 2012-11-04 DIAGNOSIS — D259 Leiomyoma of uterus, unspecified: Secondary | ICD-10-CM | POA: Insufficient documentation

## 2012-11-04 DIAGNOSIS — R05 Cough: Secondary | ICD-10-CM | POA: Insufficient documentation

## 2012-11-04 DIAGNOSIS — N83209 Unspecified ovarian cyst, unspecified side: Secondary | ICD-10-CM

## 2012-11-04 NOTE — Assessment & Plan Note (Signed)
The most common causes of chronic cough in immunocompetent adults include the following: upper airway cough syndrome (UACS), previously referred to as postnasal drip syndrome (PNDS), which is caused by variety of rhinosinus conditions; (2) asthma; (3) GERD; (4) chronic bronchitis from cigarette smoking or other inhaled environmental irritants; (5) nonasthmatic eosinophilic bronchitis; and (6) bronchiectasis.   These conditions, singly or in combination, have accounted for up to 94% of the causes of chronic cough in prospective studies.   Other conditions have constituted no >6% of the causes in prospective studies These have included bronchogenic carcinoma, chronic interstitial pneumonia, sarcoidosis, left ventricular failure, ACEI-induced cough, and aspiration from a condition associated with pharyngeal dysfunction.    Chronic cough is often simultaneously caused by more than one condition. A single cause has been found from 38 to 82% of the time, multiple causes from 18 to 62%. Multiply caused cough has been the result of three diseases up to 42% of the time.       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   These are the same pts (now being labeled as having "irritable larynx syndrome" by some cough centers) who not infrequently have a history of having failed to tolerate ace inhibitors,  dry powder inhalers or biphosphonates or report having atypical reflux symptoms that don't respond to standard doses of PPI , and are easily  confused as having aecopd or asthma flares by even experienced allergists/ pulmonologists.   For now max rx for gerd and cyclical cough then regroup with full med reconciliation before considering additional steps.   To keep things simple, I have asked the patient to first separate medicines that are perceived as maintenance, that is to be taken daily "no matter what", from those medicines that are taken on only on an as-needed basis and I have given the patient examples of both, and then return to see our NP to generate a  detailed  medication calendar which should be followed until the next physician sees the patient and updates it.

## 2012-11-18 ENCOUNTER — Inpatient Hospital Stay (HOSPITAL_COMMUNITY)
Admission: EM | Admit: 2012-11-18 | Discharge: 2012-11-18 | DRG: 640 | Disposition: A | Discharge status: Home / Self Care | Payer: Medicare Other | Attending: Emergency Medicine | Admitting: Emergency Medicine

## 2012-11-18 ENCOUNTER — Encounter (HOSPITAL_COMMUNITY): Payer: Self-pay | Admitting: *Deleted

## 2012-11-18 ENCOUNTER — Emergency Department (HOSPITAL_COMMUNITY): Payer: Medicare Other

## 2012-11-18 DIAGNOSIS — R519 Headache, unspecified: Secondary | ICD-10-CM

## 2012-11-18 DIAGNOSIS — E875 Hyperkalemia: Principal | ICD-10-CM

## 2012-11-18 DIAGNOSIS — D631 Anemia in chronic kidney disease: Secondary | ICD-10-CM | POA: Diagnosis present

## 2012-11-18 DIAGNOSIS — N058 Unspecified nephritic syndrome with other morphologic changes: Secondary | ICD-10-CM | POA: Diagnosis present

## 2012-11-18 DIAGNOSIS — E11319 Type 2 diabetes mellitus with unspecified diabetic retinopathy without macular edema: Secondary | ICD-10-CM | POA: Diagnosis present

## 2012-11-18 DIAGNOSIS — N186 End stage renal disease: Secondary | ICD-10-CM

## 2012-11-18 DIAGNOSIS — E1139 Type 2 diabetes mellitus with other diabetic ophthalmic complication: Secondary | ICD-10-CM | POA: Diagnosis present

## 2012-11-18 DIAGNOSIS — N2581 Secondary hyperparathyroidism of renal origin: Secondary | ICD-10-CM | POA: Diagnosis present

## 2012-11-18 DIAGNOSIS — F329 Major depressive disorder, single episode, unspecified: Secondary | ICD-10-CM | POA: Diagnosis present

## 2012-11-18 DIAGNOSIS — Z794 Long term (current) use of insulin: Secondary | ICD-10-CM

## 2012-11-18 DIAGNOSIS — E785 Hyperlipidemia, unspecified: Secondary | ICD-10-CM | POA: Diagnosis present

## 2012-11-18 DIAGNOSIS — Z9483 Pancreas transplant status: Secondary | ICD-10-CM

## 2012-11-18 DIAGNOSIS — Z79899 Other long term (current) drug therapy: Secondary | ICD-10-CM

## 2012-11-18 DIAGNOSIS — F3289 Other specified depressive episodes: Secondary | ICD-10-CM | POA: Diagnosis present

## 2012-11-18 DIAGNOSIS — Z940 Kidney transplant status: Secondary | ICD-10-CM

## 2012-11-18 DIAGNOSIS — G2581 Restless legs syndrome: Secondary | ICD-10-CM | POA: Diagnosis present

## 2012-11-18 DIAGNOSIS — R05 Cough: Secondary | ICD-10-CM

## 2012-11-18 DIAGNOSIS — E1129 Type 2 diabetes mellitus with other diabetic kidney complication: Secondary | ICD-10-CM | POA: Diagnosis present

## 2012-11-18 DIAGNOSIS — Z7982 Long term (current) use of aspirin: Secondary | ICD-10-CM

## 2012-11-18 DIAGNOSIS — R059 Cough, unspecified: Secondary | ICD-10-CM

## 2012-11-18 DIAGNOSIS — I120 Hypertensive chronic kidney disease with stage 5 chronic kidney disease or end stage renal disease: Secondary | ICD-10-CM | POA: Diagnosis present

## 2012-11-18 DIAGNOSIS — Z992 Dependence on renal dialysis: Secondary | ICD-10-CM

## 2012-11-18 DIAGNOSIS — E669 Obesity, unspecified: Secondary | ICD-10-CM | POA: Diagnosis present

## 2012-11-18 LAB — COMPREHENSIVE METABOLIC PANEL
ALT: 22 U/L (ref 0–35)
AST: 52 U/L — ABNORMAL HIGH (ref 0–37)
Albumin: 3.4 g/dL — ABNORMAL LOW (ref 3.5–5.2)
Alkaline Phosphatase: 149 U/L — ABNORMAL HIGH (ref 39–117)
CO2: 16 mEq/L — ABNORMAL LOW (ref 19–32)
Chloride: 92 mEq/L — ABNORMAL LOW (ref 96–112)
Creatinine, Ser: 10.83 mg/dL — ABNORMAL HIGH (ref 0.50–1.10)
GFR calc non Af Amer: 4 mL/min — ABNORMAL LOW (ref >90)
Potassium: >7.5 mEq/L (ref 3.5–5.1)
Sodium: 127 mEq/L — ABNORMAL LOW (ref 135–145)
Total Bilirubin: 0.3 mg/dL (ref 0.3–1.2)

## 2012-11-18 LAB — CBC
MCV: 98.9 fL (ref 78.0–100.0)
Platelets: 186 10*3/uL (ref 150–400)
RBC: 3.65 MIL/uL — ABNORMAL LOW (ref 3.87–5.11)
RDW: 14.7 % (ref 11.5–15.5)
WBC: 5.5 10*3/uL (ref 4.0–10.5)

## 2012-11-18 MED ORDER — SODIUM CHLORIDE 0.9 % IV SOLN: 1000.0000 mL | Freq: Once | INTRAVENOUS | Status: DC

## 2012-11-18 MED ORDER — SODIUM CHLORIDE 0.9 % IV SOLN
1.0000 g | Freq: Once | INTRAVENOUS | Status: AC
  Administered 2012-11-18: 1 g via INTRAVENOUS
  Filled 2012-11-18: qty 10

## 2012-11-18 MED ORDER — INSULIN ASPART 100 UNIT/ML ~~LOC~~ SOLN: 5.0000 [IU] | Freq: Once | SUBCUTANEOUS | Status: DC

## 2012-11-18 MED ORDER — SODIUM CHLORIDE 0.9 % IV BOLUS (SEPSIS): 1000.0000 mL | Freq: Once | INTRAVENOUS | Status: DC

## 2012-11-18 MED ORDER — DIPHENHYDRAMINE HCL 50 MG/ML IJ SOLN: 25.0000 mg | Freq: Once | INTRAMUSCULAR | Status: DC

## 2012-11-18 MED ORDER — HYDROMORPHONE HCL PF 1 MG/ML IJ SOLN
0.5000 mg | Freq: Once | INTRAMUSCULAR | Status: AC
  Administered 2012-11-18: 0.5 mg via INTRAVENOUS

## 2012-11-18 MED ORDER — SODIUM CHLORIDE 0.9 % IV SOLN: 1000.0000 mL | INTRAVENOUS | Status: DC

## 2012-11-18 MED ORDER — METOCLOPRAMIDE HCL 5 MG/ML IJ SOLN
10.0000 mg | Freq: Once | INTRAMUSCULAR | Status: AC
  Administered 2012-11-18: 10 mg via INTRAVENOUS
  Filled 2012-11-18: qty 2

## 2012-11-18 NOTE — ED Notes (Signed)
Pt brought back from triage via wheelchair with family in tow; pt getting undressed and into a gown at this time; family at bedside; blankets given

## 2012-11-18 NOTE — H&P (Signed)
Short History and Physical Form [facility]  11/18/2012,4:41 PM  HPI:   [name] is an 42 y.o. female with ESRD (TTS NW) with a hx of Type 1DM and failed kidney/pancreas transplant who presented to the ED with complaint of a headache and was found to have a a K > 7.5 and glucose of 474. She was treated with 5 units of Novolog. Repeat istat K 6.9 pre HD; Unable to get IV access for Ca gluconate. States she had potato chips this past week. "Just learned that they were high in K"  Past Medical History  Diagnosis Date  . Hyperlipidemia   . Alopecia   . Obesity   . Iron deficiency   . 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 RLS (restless legs syndrome)   . Respiratory failure   . Injury of conjunctiva and corneal abrasion of right eye without foreign body   . Cellulitis   . Dialysis patient     Tues; Thurs; Sat; [place]  . Angina   . 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 Anemia   . Pneumonia 2012    "double"  . Blood transfusion 2004    "when I had my transplant"  . Migraine   . 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 Hyperparathyroidism, secondary   . Diabetic retinopathy   . Hypertension     takes Metoprolol and Exforge daily  . Depression     takes Klonopin nightly  . Diabetes mellitus type 1     Pt states diagnosed at age 31 with prior episodes of DKA. S/p pancreatic transplant   . GERD (gastroesophageal reflux disease)     takes Protonix daily  . PONV (postoperative nausea and vomiting)   . Bronchitis   . Migraine   . Asthma   . Shortness of breath   . Emphysema of lung   . DKA (diabetic ketoacidoses) 07/02/2012   Allergies  Allergen Reactions  . Morphine And Related Nausea And Vomiting and Other (See Comments)    "makes me delerirous"  . Penicillins Anaphylaxis  . Tramadol Nausea And Vomiting  . Depakote  (Divalproex Sodium) Other (See Comments)    Pt gets "delirious"  . Vicodin (Hydrocodone-Acetaminophen) Rash   Prior to Admission medications   Medication Sig Start Date End Date Taking? Authorizing Provider  albuterol (PROVENTIL HFA;VENTOLIN HFA) 108 (90 BASE) MCG/ACT inhaler Inhale 2 puffs into the lungs every 4 (four) hours as needed. For wheezing.  04/23/11  Yes Nyoka Cowden, MD  aspirin EC 81 MG tablet Take 81 mg by mouth daily.   Yes Historical Provider, MD  calcium acetate (PHOSLO) 667 MG capsule Take 2,001-2,668 mg by mouth 3 (three) times daily with meals. Dosage depends on meal consumed 03/12/11  Yes Historical Provider, MD  clonazePAM (KLONOPIN) 0.5 MG tablet Take 1 mg by mouth at bedtime.    Yes Historical Provider, MD  EXFORGE 10-160 MG per tablet Take 1 tablet by mouth at bedtime.  06/15/12  Yes Historical Provider, MD  Insulin Aspart (NOVOLOG FLEXPEN [place]) Inject 3-6 Units into the skin 3 (three) times daily before meals. Takes 6 units in am and at night, if pt eats lunch she will take insulin before she eats   Yes Historical Provider, MD  insulin glargine (LANTUS) 100 units/mL SOLN Inject 7 Units into the skin  2 (two) times daily.   Yes Historical Provider, MD  metoprolol succinate (TOPROL-XL) 100 MG 24 hr tablet Take 100 mg by mouth daily. Take with or immediately following a meal.   Yes Historical Provider, MD  mycophenolate (CELLCEPT) 250 MG capsule Take 250 mg by mouth 2 (two) times daily.   Yes Historical Provider, MD  pantoprazole (PROTONIX) 40 MG tablet Take 40 mg by mouth daily.   Yes Historical Provider, MD  predniSONE (DELTASONE) 5 MG tablet Take 1 tablet by mouth daily. 01/21/11  Yes Historical Provider, MD  tacrolimus (PROGRAF) 1 MG capsule Take 1 mg by mouth 2 (two) times daily.   Yes Historical Provider, MD  zolpidem (AMBIEN) 10 MG tablet Take 10 mg by mouth at bedtime. For insomnia.   Yes Historical Provider, MD   Results for orders placed during the hospital encounter  of 11/18/12 (from the past 48 hour(s))  GLUCOSE, CAPILLARY     Status: Abnormal   Collection Time    11/18/12 11:22 AM      Result Value Range   Glucose-Capillary 464 (*) 70 - 99 mg/dL  CBC     Status: Abnormal   Collection Time    11/18/12  1:45 PM      Result Value Range   WBC 5.5  4.0 - 10.5 K/uL   RBC 3.65 (*) 3.87 - 5.11 MIL/uL   Hemoglobin 11.7 (*) 12.0 - 15.0 g/dL   HCT 56.2  13.0 - 86.5 %   MCV 98.9  78.0 - 100.0 fL   MCH 32.1  26.0 - 34.0 pg   MCHC 32.4  30.0 - 36.0 g/dL   RDW 78.4  69.6 - 29.5 %   Platelets 186  150 - 400 K/uL  COMPREHENSIVE METABOLIC PANEL     Status: Abnormal   Collection Time    11/18/12  1:45 PM      Result Value Range   Sodium 127 (*) 135 - 145 mEq/L   Potassium >7.5 (*) 3.5 - 5.1 mEq/L   Comment: CRITICAL RESULT CALLED TO, READ BACK BY AND VERIFIED WITH:     RN C. GROSE 11/18/12 1455 KERAN M.     REPEATED TO VERIFY   Chloride 92 (*) 96 - 112 mEq/L   CO2 16 (*) 19 - 32 mEq/L   Glucose, Bld 352 (*) 70 - 99 mg/dL   BUN 77 (*) 6 - 23 mg/dL   Creatinine, Ser 28.41 (*) 0.50 - 1.10 mg/dL   Calcium 7.8 (*) 8.4 - 10.5 mg/dL   Total Protein 7.2  6.0 - 8.3 g/dL   Albumin 3.4 (*) 3.5 - 5.2 g/dL   AST 52 (*) 0 - 37 U/L   ALT 22  0 - 35 U/L   Alkaline Phosphatase 149 (*) 39 - 117 U/L   Total Bilirubin 0.3  0.3 - 1.2 mg/dL   GFR calc non Af Amer 4 (*) >90 mL/min   GFR calc Af Amer 5 (*) >90 mL/min   Comment:            The eGFR has been calculated     using the CKD EPI equation.     This calculation has not been     validated in all clinical     situations.     eGFR's persistently     <90 mL/min signify     possible Chronic Kidney Disease.  HCG, SERUM, QUALITATIVE     Status: None   Collection Time    11/18/12  1:46 PM      Result Value Range   Preg, Serum NEGATIVE  NEGATIVE   Comment:            THE SENSITIVITY OF THIS     METHODOLOGY IS >10 mIU/mL.   Dg Chest 2 View  11/18/2012   *RADIOLOGY REPORT*  Clinical Data: Migraine.  Cough.   Chest pain.  Short of breath.  CHEST - 2 VIEW  Comparison: 07/02/2012.  Findings: The left IJ dialysis catheter is no longer present. Cardiopericardial silhouette is mildly enlarged.  Interstitial pulmonary edema is present along the cephalization of pulmonary blood flow compatible with volume overload.  Thickening of the fissures is present on the lateral view. Cholecystectomy clips are present in the right upper quadrant.  Mediastinal contours appear within normal limits. No effusion or focal consolidation.  IMPRESSION: Findings compatible with mild CHF/volume overload with interstitial pulmonary edema.   Original Report Authenticated By: Andreas Newport, M.D.   Physical Exam: Filed Vitals:   11/18/12 1604  BP: 150/74  Pulse: 88  Temp:   Resp: 18   General: NAD, quite talkative and animated with dialysis nurses Heart: RRR Lungs: bilateral crackles Abdomen: soft NT Extremities: no significant edema Access:right thigh AVGG + bruit - HD being initiated/  Assessment/Plan: 1 Hyperkalemia - K > 7.5; EKG not done in ED; pt not on tele s/p 5 Units Novolog - pt already hyperglycemia; for  emergent HD; repeat K's follow serially and accuchecks; likely related to dietary indiscretion. Place on tele during her dialysis treatments. 2 ESRD: TTS [facility] - missed today; orders per Dr. Briant Cedar 3. Mild interstitial edema - decrease volume during HD today; She is 5.5 kg above her EDW!!! Also noncompliance with fluid restriction as well. 4. DM - glucose 464; 5 units Novolog given in ED 5. HTN - needs decreased volume; resume usual meds after d/c 6. Headache - IV pain med not given due to no IV access; on HD now; if needed can give IV dilaudid. 6. Disp - plan discharge home after dialysis with followup Dietary education at her dialysis center.  Sheffield Slider, PA-C [facility] Beeper [phone] 11/18/2012, 4:41 PM   Attending Physician: Dr. Primitivo Gauze Pt seen and examined and agree with  above note by Bard Herbert  42yo bf with esrd came to er  Instead of hd for headache.  Headache better but lab in er showed potassium level of 7.5  Pt beihg admitted for emergent hd to lower potassium level with plans to dc after hd.  Dr Primitivo Gauze

## 2012-11-18 NOTE — Progress Notes (Signed)
Arrived to start PIV.    Pt stated,  "They can't ever get an IV on me"    "Are you going to stick me?"    "They always have to use an ultrasound and put it in my neck"    When I asked her if she prefer the MD do that she replied, "Yes".   So no attempts made.   Primary RN made aware.

## 2012-11-18 NOTE — ED Notes (Signed)
Spoke with Arline Asp, IV team. Aware of need for IV & labs.

## 2012-11-18 NOTE — ED Notes (Signed)
Pt is here with migraine for 2 weeks and reporting high readings on blood glucose.

## 2012-11-18 NOTE — ED Notes (Signed)
Pt reports migraine x 2 weeks but "I always have them". C/o high CBG's x 1 week of > 450, states has been taking her lantus twice daily in addition to sliding scale novolog. Has appt with PCP Friday regarding diabetes. Also c/o generalized weakness & was not able to go to dialysis this morning ([facility]).

## 2012-11-18 NOTE — ED Notes (Signed)
Nephrology MD at bedside

## 2012-11-18 NOTE — Procedures (Signed)
Pt seen on hd  Note written later as waited for new k level.  Pre hd k was 6.5   Will plan 1k bath for one hour then two k bath for remainder of treatment  Anticipate dc after hd

## 2012-11-18 NOTE — ED Provider Notes (Signed)
History    CSN: [id number] Arrival date & time 11/18/12  1107  First MD Initiated Contact with Patient 11/18/12 1145     Chief Complaint  Patient presents with  . Migraine  . Hyperglycemia   (Consider location/radiation/quality/duration/timing/severity/associated sxs/prior Treatment) Patient is a 42 y.o. female presenting with migraines and hyperglycemia.  Migraine This is a chronic problem. Episode onset: 2 weeks ago. The problem occurs constantly. The problem has been waxing and waning. Associated symptoms include coughing (chronically), nausea, vomiting and weakness (generalized). Pertinent negatives include no abdominal pain, chest pain, chills, congestion, fever, numbness, rash or sore throat. Nothing aggravates the symptoms. She has tried nothing for the symptoms.  Hyperglycemia Blood sugar level PTA:  HIGH Onset quality:  Gradual Duration:  2 weeks Timing:  Constant Progression:  Worsening Chronicity:  Recurrent Diabetes status:  Controlled with insulin Current diabetic therapy:  Lantus and novolog Context: recent illness   Associated symptoms: nausea and vomiting   Associated symptoms: no abdominal pain, no chest pain, no dizziness, no dysuria, no fever, no polyuria and no shortness of breath    Past Medical History  Diagnosis Date  . Hyperlipidemia   . Alopecia   . Obesity   . Iron deficiency   . 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 RLS (restless legs syndrome)   . Respiratory failure   . Injury of conjunctiva and corneal abrasion of right eye without foreign body   . Cellulitis   . Dialysis patient     Tues; Thurs; Sat; [place]  . Angina   . 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 Anemia   . Pneumonia 2012    "double"  . Blood transfusion 2004    "when I had my transplant"  . Migraine   . DM (diabetes mellitus), type 1 with renal complications  08/11/2011  . Hyperparathyroidism, secondary   . Diabetic retinopathy   . Hypertension     takes Metoprolol and Exforge daily  . Depression     takes Klonopin nightly  . Diabetes mellitus type 1     Pt states diagnosed at age 28 with prior episodes of DKA. S/p pancreatic transplant   . GERD (gastroesophageal reflux disease)     takes Protonix daily  . PONV (postoperative nausea and vomiting)   . Bronchitis   . Migraine   . Asthma   . Shortness of breath   . Emphysema of lung   . DKA (diabetic ketoacidoses) 07/02/2012   Past Surgical History  Procedure Laterality Date  . 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 Thyroglobulin      x 7  . Av fistula placement      right upper arm  . Cholecystectomy  1995  .  hd graft placement/removal      "had 2 in my left upper arm"  . Eye surgery    . Retinopathy surgery    . Tooth extraction  10/10/11    X's two  . Insertion of dialysis catheter  12/08/2011    Procedure: INSERTION OF DIALYSIS CATHETER;  Surgeon: Chuck Hint, MD;  Location: [facility] OR;  Service: Vascular;  Laterality: Left;  . Av fistula placement  01/22/2012    Procedure: INSERTION OF ARTERIOVENOUS (AV) GORE-TEX GRAFT ARM;  Surgeon: Chuck Hint, MD;  Location: [facility] OR;  Service: Vascular;  Laterality: Left;  Ultrasound guided.  . Av fistula placement  03/14/2012    Procedure: INSERTION OF ARTERIOVENOUS (AV) GORE-TEX GRAFT THIGH;  Surgeon: Chuck Hint, MD;  Location: [facility] OR;  Service: Vascular;  Laterality: Right;   Family History  Problem Relation Age of Onset  . Hypertension Mother   . Kidney disease Mother   . Diabetes Mother   . Hypertension Father   . Kidney disease Father   . Diabetes Father    History  Substance Use Topics  . Smoking status: Never Smoker   . Smokeless tobacco: Never Used  . Alcohol Use: No   OB History   Grav Para Term Preterm Abortions TAB SAB Ect Mult Living                 Review  of Systems  Constitutional: Negative for fever and chills.  HENT: Negative for congestion, sore throat and rhinorrhea.   Eyes: Negative for photophobia and visual disturbance.  Respiratory: Positive for cough (chronically). Negative for shortness of breath.   Cardiovascular: Negative for chest pain and leg swelling.  Gastrointestinal: Positive for nausea and vomiting. Negative for abdominal pain, diarrhea and constipation.  Endocrine: Negative for polyphagia and polyuria.  Genitourinary: Negative for dysuria, flank pain, vaginal bleeding, vaginal discharge and enuresis.  Musculoskeletal: Negative for back pain and gait problem.  Skin: Negative for color change and rash.  Neurological: Positive for weakness (generalized). Negative for dizziness, syncope, light-headedness and numbness.  Hematological: Negative for adenopathy. Does not bruise/bleed easily.  All other systems reviewed and are negative.    Allergies  Morphine and related; Penicillins; Tramadol; Depakote; and Vicodin  Home Medications   Current Outpatient Rx  Name  Route  Sig  Dispense  Refill  . albuterol (PROVENTIL HFA;VENTOLIN HFA) 108 (90 BASE) MCG/ACT inhaler   Inhalation   Inhale 2 puffs into the lungs every 4 (four) hours as needed. For wheezing.          Marland Kitchen aspirin EC 81 MG tablet   Oral   Take 81 mg by mouth daily.         . calcium acetate (PHOSLO) 667 MG capsule   Oral   Take 2,001-2,668 mg by mouth 3 (three) times daily with meals. Dosage depends on meal consumed         . clonazePAM (KLONOPIN) 0.5 MG tablet   Oral   Take 1 mg by mouth at bedtime.          Marland Kitchen EXFORGE 10-160 MG per tablet   Oral   Take 1 tablet by mouth at bedtime.          . Insulin Aspart (NOVOLOG FLEXPEN [place])   Subcutaneous   Inject 3-6 Units into the skin 3 (three) times daily before meals. Takes 6 units in am and at night, if pt eats lunch she will take insulin before she eats         . insulin glargine (LANTUS) 100  units/mL SOLN   Subcutaneous   Inject 7 Units into the skin 2 (two) times daily.         . metoprolol succinate (TOPROL-XL) 100 MG 24 hr tablet   Oral   Take 100 mg by mouth daily. Take with or immediately following a meal.         . mycophenolate (CELLCEPT) 250 MG capsule   Oral   Take 250 mg by mouth 2 (two) times daily.         . pantoprazole (  PROTONIX) 40 MG tablet   Oral   Take 40 mg by mouth daily.         . predniSONE (DELTASONE) 5 MG tablet   Oral   Take 1 tablet by mouth daily.         . tacrolimus (PROGRAF) 1 MG capsule   Oral   Take 1 mg by mouth 2 (two) times daily.         Marland Kitchen zolpidem (AMBIEN) 10 MG tablet   Oral   Take 10 mg by mouth at bedtime. For insomnia.          BP 150/74  Pulse 88  Temp(Src) 98 F (36.7 C) (Oral)  Resp 18  SpO2 100%  LMP 10/21/2012 Physical Exam  Vitals reviewed. Constitutional: She is oriented to person, place, and time. She appears well-developed and well-nourished.  HENT:  Head: Normocephalic and atraumatic.  Right Ear: External ear normal.  Left Ear: External ear normal.  Eyes: Conjunctivae and EOM are normal. Pupils are equal, round, and reactive to light.  Neck: Normal range of motion. Neck supple.  Cardiovascular: Normal rate, regular rhythm, normal heart sounds and intact distal pulses.   Pulmonary/Chest: Effort normal and breath sounds normal.  Abdominal: Soft. Bowel sounds are normal. There is no tenderness.  Musculoskeletal: Normal range of motion.  Neurological: She is alert and oriented to person, place, and time.  Skin: Skin is warm and dry.    ED Course  Procedures (including critical care time) Labs Reviewed  CBC - Abnormal; Notable for the following:    RBC 3.65 (*)    Hemoglobin 11.7 (*)    All other components within normal limits  COMPREHENSIVE METABOLIC PANEL - Abnormal; Notable for the following:    Sodium 127 (*)    Potassium >7.5 (*)    Chloride 92 (*)    CO2 16 (*)    Glucose,  Bld 352 (*)    BUN 77 (*)    Creatinine, Ser 10.83 (*)    Calcium 7.8 (*)    Albumin 3.4 (*)    AST 52 (*)    Alkaline Phosphatase 149 (*)    GFR calc non Af Amer 4 (*)    GFR calc Af Amer 5 (*)    All other components within normal limits  GLUCOSE, CAPILLARY - Abnormal; Notable for the following:    Glucose-Capillary 464 (*)    All other components within normal limits  HCG, SERUM, QUALITATIVE   Dg Chest 2 View  11/18/2012   *RADIOLOGY REPORT*  Clinical Data: Migraine.  Cough.  Chest pain.  Short of breath.  CHEST - 2 VIEW  Comparison: 07/02/2012.  Findings: The left IJ dialysis catheter is no longer present. Cardiopericardial silhouette is mildly enlarged.  Interstitial pulmonary edema is present along the cephalization of pulmonary blood flow compatible with volume overload.  Thickening of the fissures is present on the lateral view. Cholecystectomy clips are present in the right upper quadrant.  Mediastinal contours appear within normal limits. No effusion or focal consolidation.  IMPRESSION: Findings compatible with mild CHF/volume overload with interstitial pulmonary edema.   Original Report Authenticated By: Andreas Newport, M.D.   1. Hyperkalemia   2. Cough   3. Headache     MDM  42 y.o. female  with pertinent PMH of DM, ESRD, failed pancreaticorenal tx presents with hyperglycemia and ha x 2 weeks.  Pt denies medication noncompliance, however has had fever to 101 4 days ago.  She has chronic cough,  no recent change.  She has had nausea and vomiting over this time period as well.  Physical exam as above, benign.  Vitals unremarkable.  Initial Glucose 464.  Labs otherwise demonstrated hyperkalemia, pt admits to dietary noncompliance.  Consulted nephrology, pt to dialysis from ED.   Labs and imaging as above reviewed by myself and attending,Dr. Patria Mane, with whom case was discussed.   1. Hyperkalemia   2. Cough   3. Headache       Noel Gerold, MD 11/18/12 1644

## 2012-11-18 NOTE — ED Notes (Signed)
Patient transported to Dialysis by M.Neale Burly

## 2012-11-18 NOTE — ED Notes (Signed)
Patient transported to X-ray 

## 2012-11-18 NOTE — ED Notes (Signed)
Pt refused to be stuck for labs or IV. "No one can get it, not even IV team. The doctors always have to put ine in my neck". IV team paged. Pt reports she is anuric, has "not made any urine in years". ED MD informed & aware.

## 2012-11-18 NOTE — ED Provider Notes (Signed)
I saw and evaluated the patient, reviewed the resident's note and I agree with the findings and plan.  Presenting with headache and hyperglycemia.  No signs of DKA.  The patient does have indications for acute need for dialysis with a potassium greater than 7.5.  Friday calcium and insulin and other hyperkalemia treatments can be given in emergency department the patient was taken emergently to dialysis.  No arrhythmia was noted.  1. Hyperkalemia   2. Cough   3. Headache   4. ESRD needing dialysis      Lyanne Co, MD 11/18/12 1651

## 2012-11-19 LAB — POCT I-STAT, CHEM 8
BUN: 76 mg/dL — ABNORMAL HIGH (ref 6–23)
Creatinine, Ser: 12 mg/dL — ABNORMAL HIGH (ref 0.50–1.10)
Glucose, Bld: 287 mg/dL — ABNORMAL HIGH (ref 70–99)
Hemoglobin: 11.9 g/dL — ABNORMAL LOW (ref 12.0–15.0)
Potassium: 6.5 mEq/L (ref 3.5–5.1)

## 2012-11-19 NOTE — Discharge Summary (Signed)
Physician Discharge Summary  Patient ID: [name] MRN: [id number] DOB/AGE: 40/27/1972 40 y.o.  Admit date: 11/18/2012 Discharge date: 11/19/2012  Discharge Diagnoses:  Active Problems:   Hyperkalemia   ESRD (end stage renal disease)   Anemia of chronic renal failure   Hyperparathyroidism, secondary   Headache  Discharged Condition: improved  HPI: [name] is an 41 y.o. female with ESRD (TTS NW) with a hx of Type 1DM and failed kidney/pancreas transplant who presented to the ED with complaint of a headache and was found to have a a K > 7.5 and glucose of 474. She was treated with 5 units of Novolog. Repeat istat K 6.9 pre HD; Unable to get IV access for Ca gluconate. States she had potato chips this past week. "Just learned that they were high in [facility] Course:  Pt was transported from ED to the hemodialysis unit for emergent HD.  Repeat K was 6.5 (decrease related to dose of Novolog insulin.  ED staff had been unable to place IV. Vascular access functioned well during dialysis with Qb 400.She dialyzed on a 1 K bath for the first hour and then 2 K bath for the remainder of her treatment.  Her net UF was 5.2 of her 6 L goal with a post weight of 69.8.  She will have a K recheck at her next HD treatment and additional nutrition education regarding high K foods to avoid to be done at her dialysis center.  Treatments: Hemodialysis Blood pressure 109/55, pulse 84, temperature 98.4 F (36.9 C), temperature source Oral, resp. rate 16, weight 69.8 kg (153 lb 14.1 oz), last menstrual period 10/21/2012, SpO2 100.00%.  Disposition: 01-Home or Self Care   Future Appointments Provider Department Dept Phone   12/03/2012 10:20 AM [facility]  IMAGING [phone]   Patient should wear two piece clothing and wear no powder or deodorant. Patient should arrive 15 minutes early.       Medication List    TAKE these medications       albuterol 108 (90 BASE)  MCG/ACT inhaler  Commonly known as:  PROVENTIL HFA;VENTOLIN HFA  Inhale 2 puffs into the lungs every 4 (four) hours as needed. For wheezing.     aspirin EC 81 MG tablet  Take 81 mg by mouth daily.     calcium acetate 667 MG capsule  Commonly known as:  PHOSLO  Take 2,001-2,668 mg by mouth 3 (three) times daily with meals. Dosage depends on meal consumed     clonazePAM 0.5 MG tablet  Commonly known as:  KLONOPIN  Take 1 mg by mouth at bedtime.     EXFORGE 10-160 MG per tablet  Generic drug:  amLODipine-valsartan  Take 1 tablet by mouth at bedtime.     insulin glargine 100 units/mL Soln  Commonly known as:  LANTUS  Inject 7 Units into the skin 2 (two) times daily.     metoprolol succinate 100 MG 24 hr tablet  Commonly known as:  TOPROL-XL  Take 100 mg by mouth daily. Take with or immediately following a meal.     mycophenolate 250 MG capsule  Commonly known as:  CELLCEPT  Take 250 mg by mouth 2 (two) times daily.     NOVOLOG FLEXPEN [place]  Inject 3-6 Units into the skin 3 (three) times daily before meals. Takes 6 units in am and at night, if pt eats lunch she will take insulin before she eats  pantoprazole 40 MG tablet  Commonly known as:  PROTONIX  Take 40 mg by mouth daily.     predniSONE 5 MG tablet  Commonly known as:  DELTASONE  Take 1 tablet by mouth daily.     tacrolimus 1 MG capsule  Commonly known as:  PROGRAF  Take 1 mg by mouth 2 (two) times daily.     zolpidem 10 MG tablet  Commonly known as:  AMBIEN  Take 10 mg by mouth at bedtime. For insomnia.       20 minutes were spent coordinating d/c med list, discharge instructions, d/c summary and conveying information to her dialysis center.  Signed: Sheffield Slider, PA-C [facility] Beeper [phone] 11/19/2012, 11:40 AM  Attending Physician: Dr. Primitivo Gauze

## 2012-12-03 ENCOUNTER — Ambulatory Visit: Payer: Medicare Other

## 2012-12-04 IMAGING — CR DG CHEST 2V
2 series · 2 of 2 positions shown · non-contrast
Comparison: 09/07/2010

CLINICAL DATA: Cough, nausea, vomiting, diarrhea, diabetes

CHEST - 2 VIEW

[w chest pa]
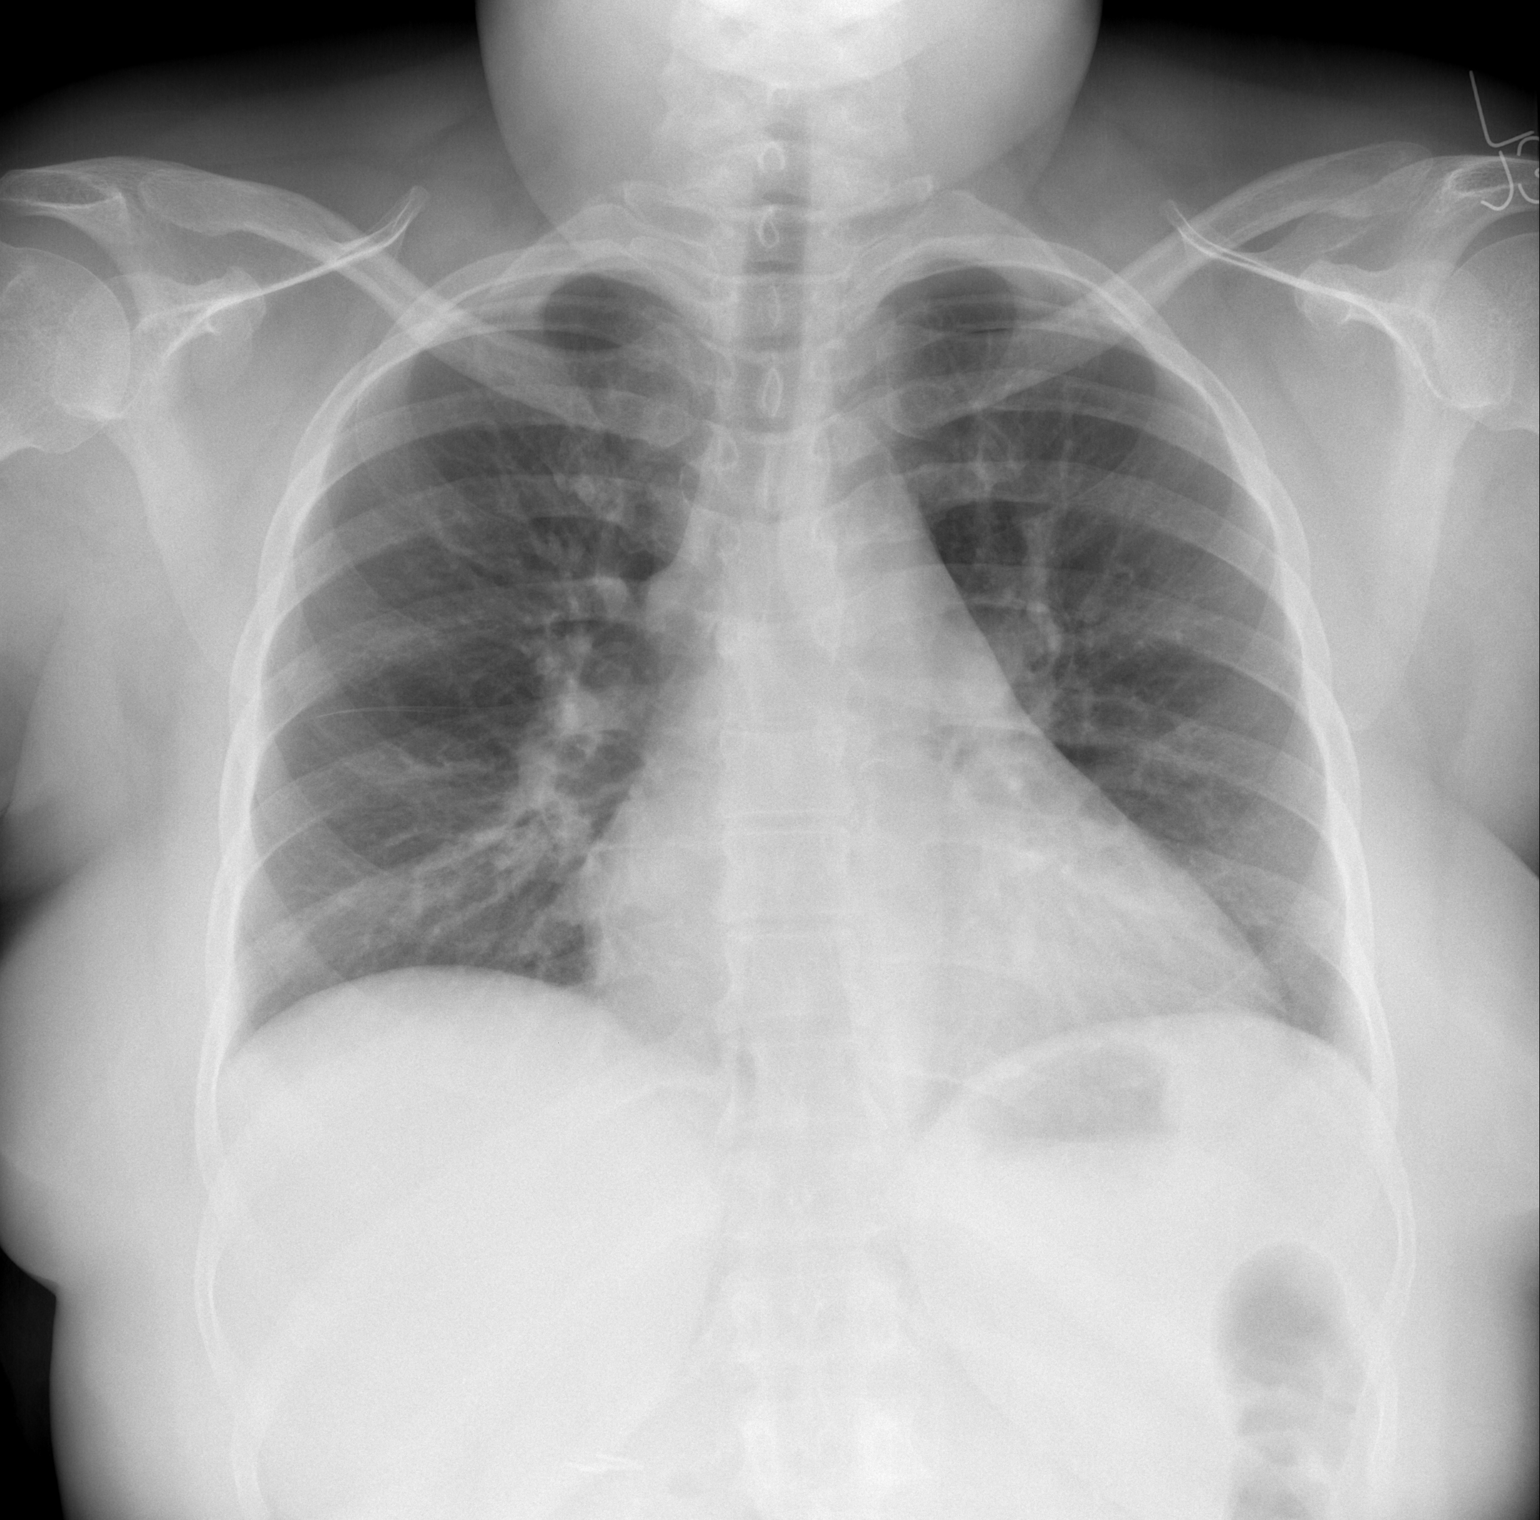

[w chest lat]
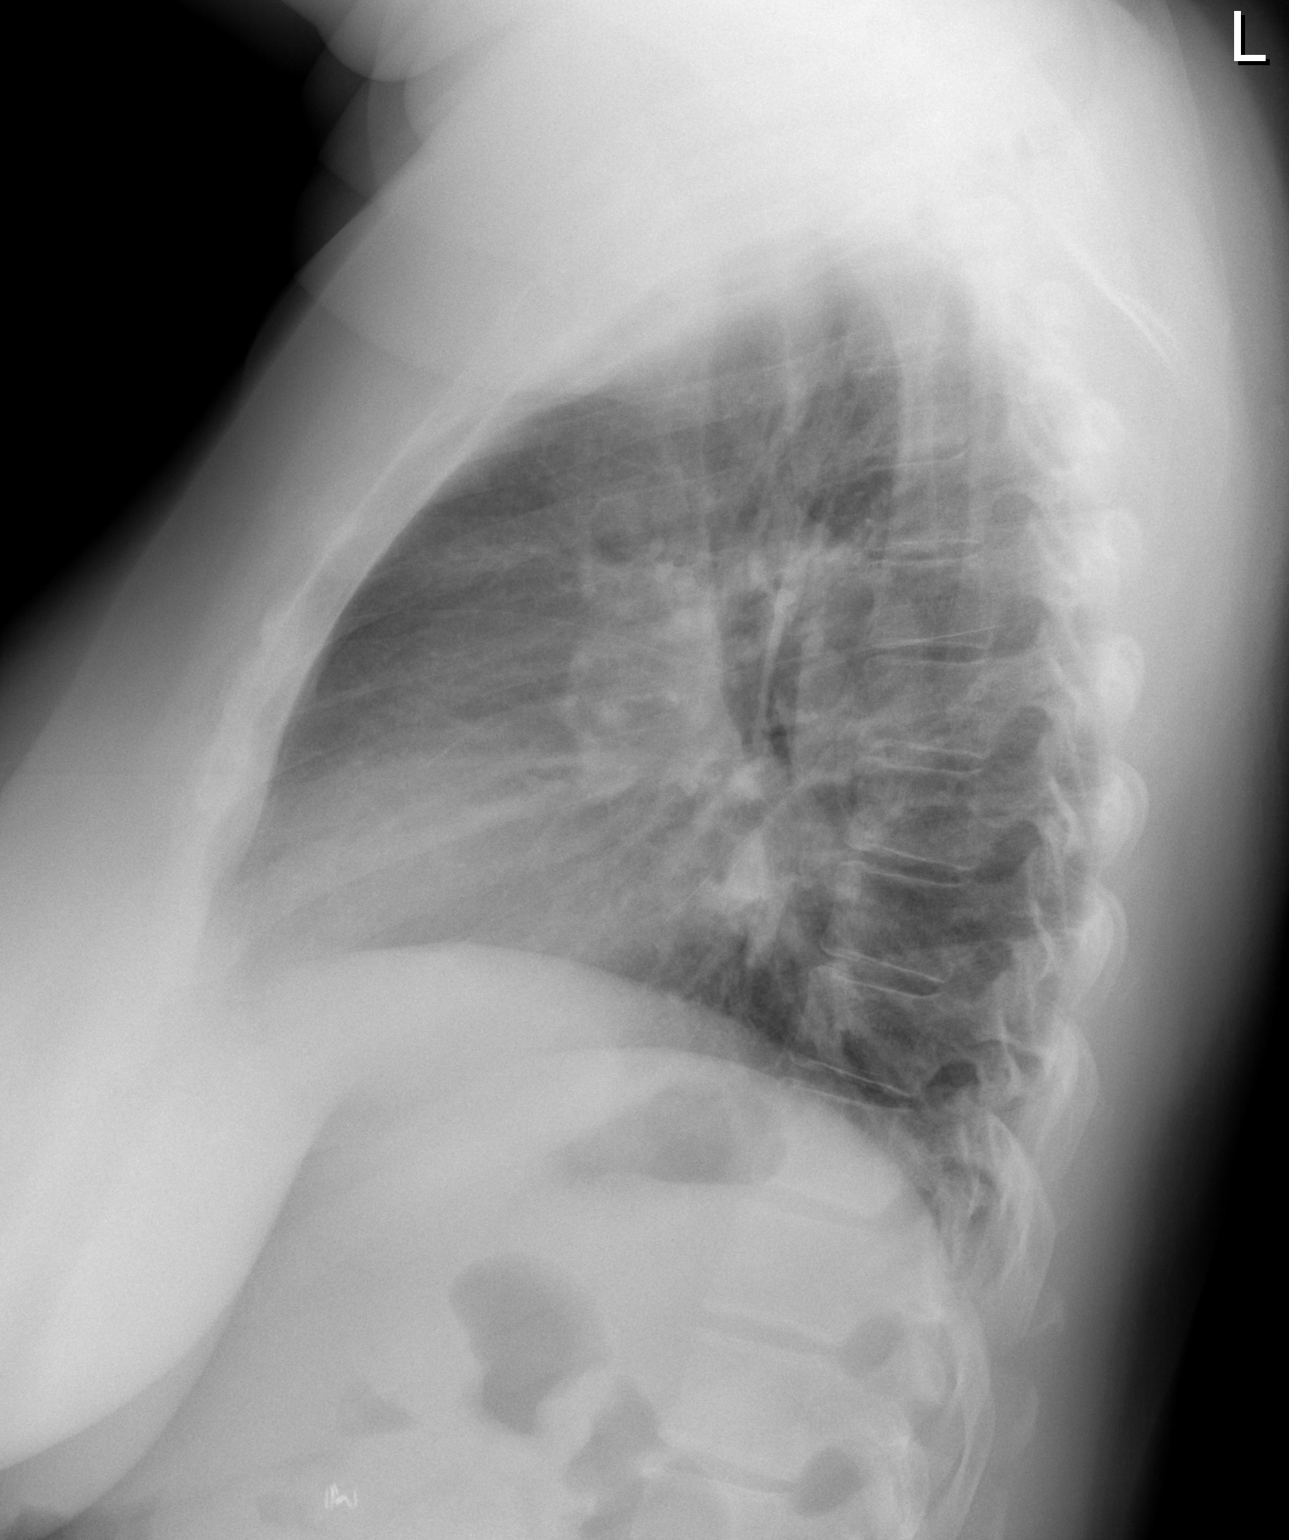

[2 of 2 positions shown; findings below may reference images not displayed]

FINDINGS: Enlargement of cardiac silhouette.
Pulmonary vascular congestion.
Mediastinal contours normal.
Lungs clear.
No pleural effusion or pneumothorax.
Bones unremarkable.
IMPRESSION: Enlargement of cardiac silhouette with pulmonary venous
hypertension.
No acute abnormalities.

## 2012-12-05 IMAGING — CR DG CHEST 2V
2 series · 2 of 2 positions shown · non-contrast
Comparison: Plain films 11/13/2010.

CLINICAL DATA: Shortness of breath and cough.

CHEST - 2 VIEW

[w chest pa]
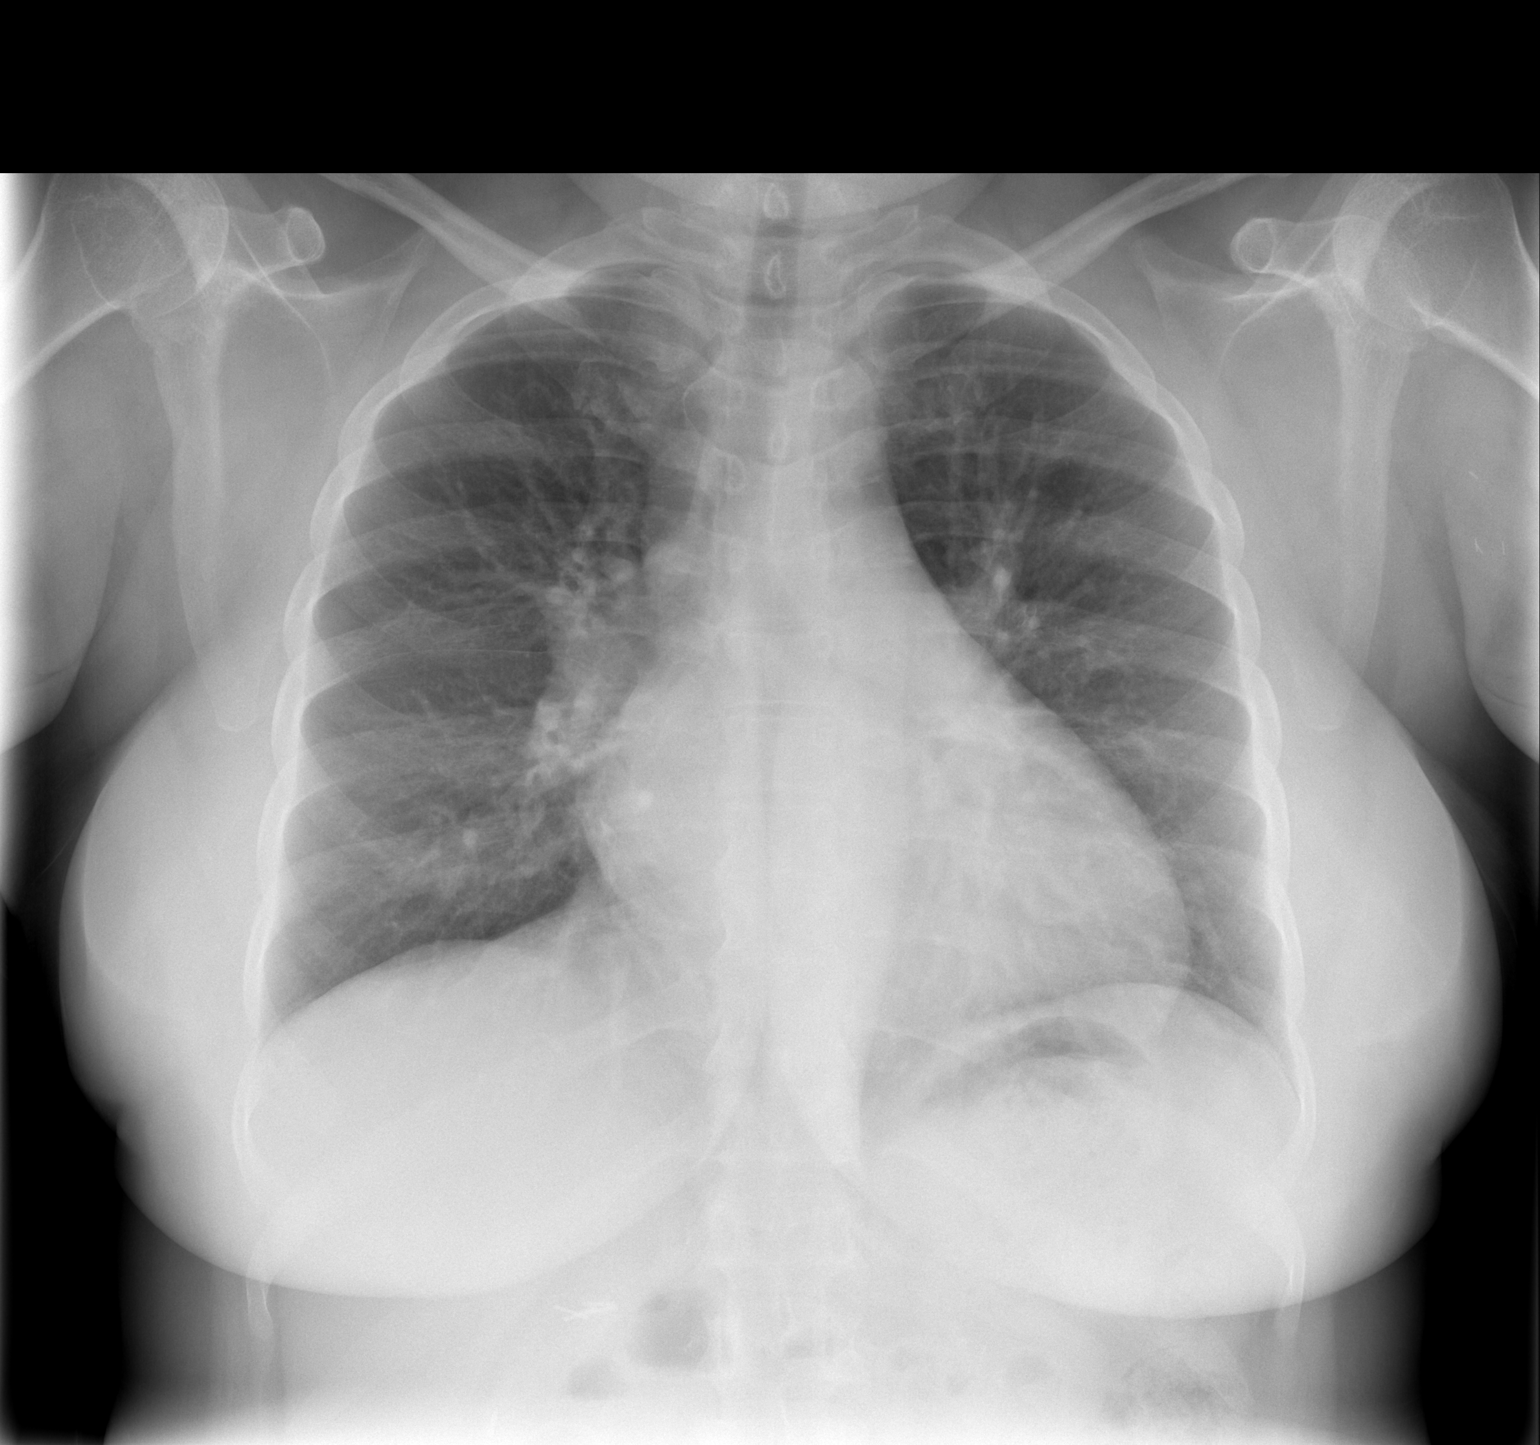

[w chest lat]
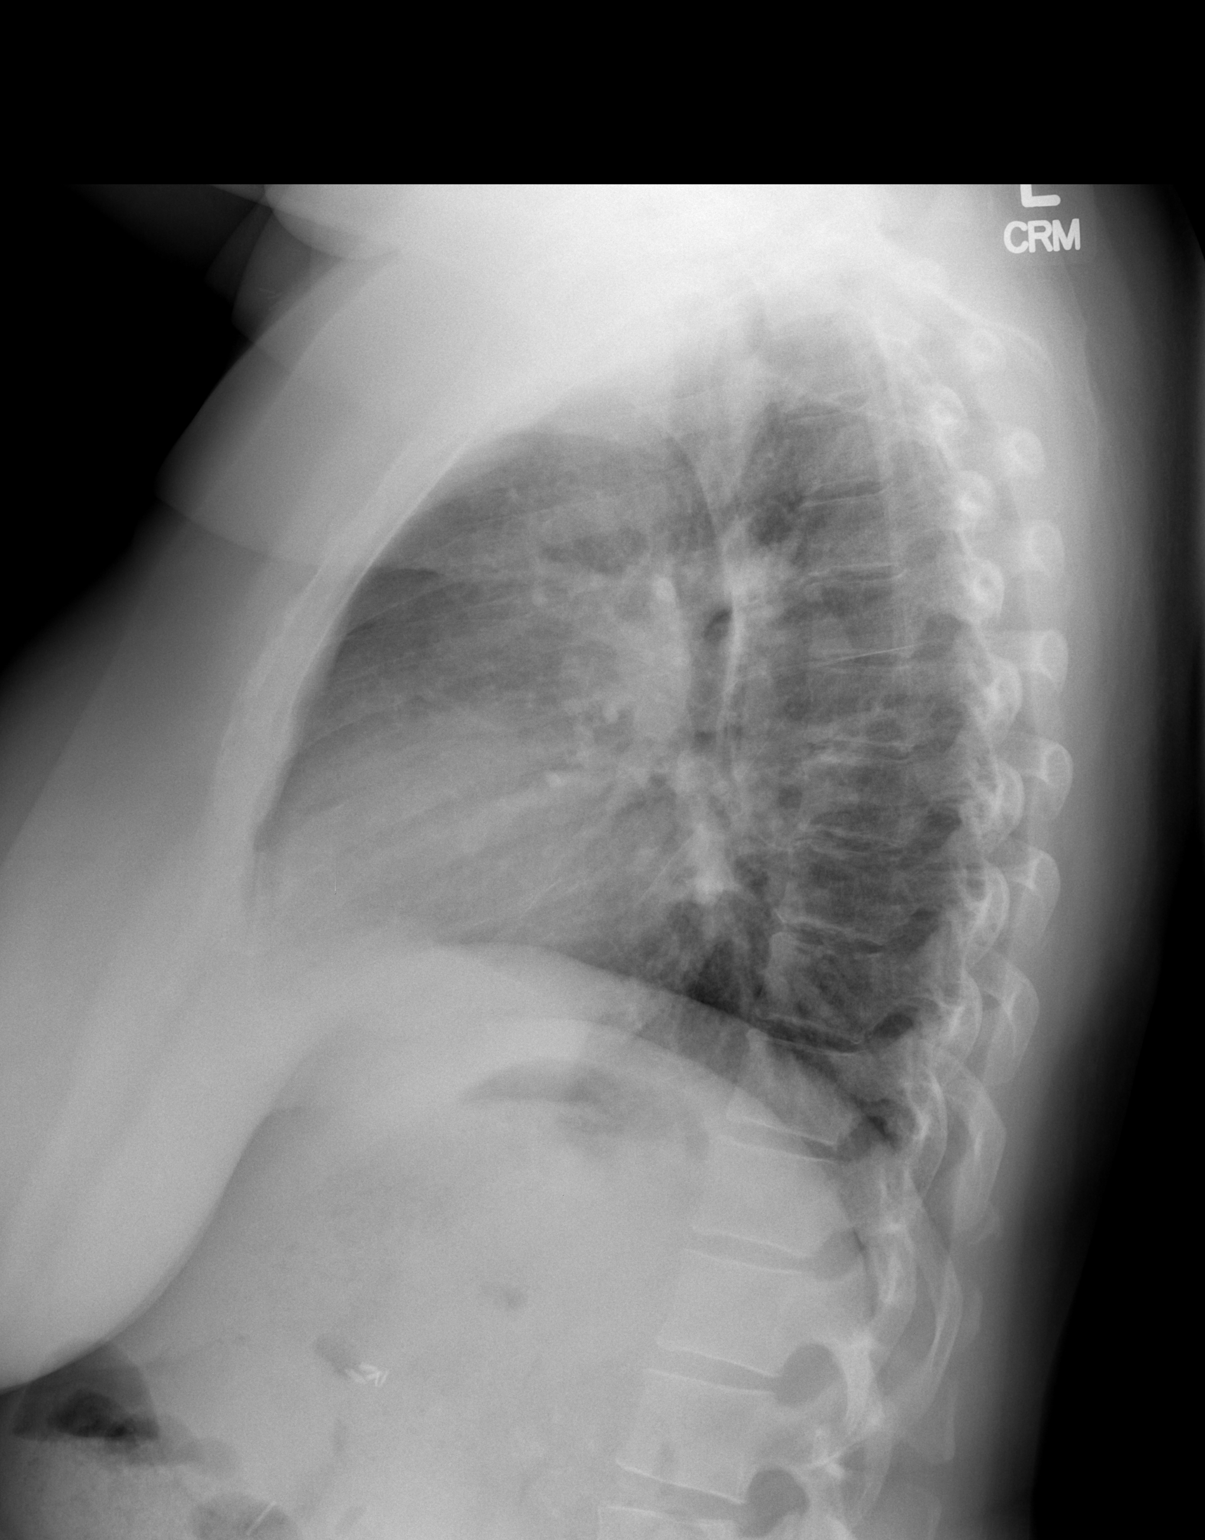

[2 of 2 positions shown; findings below may reference images not displayed]

FINDINGS: Lungs clear.   Mild cardiomegaly.  No pneumothorax or
effusion.
IMPRESSION: Negative exam.

## 2012-12-12 IMAGING — CR DG CHEST 2V
2 series · 2 of 2 positions shown · non-contrast
Comparison: 11/14/2010

CLINICAL DATA: Cough for 1 month.  Shortness of breath.

CHEST - 2 VIEW

[w chest pa]
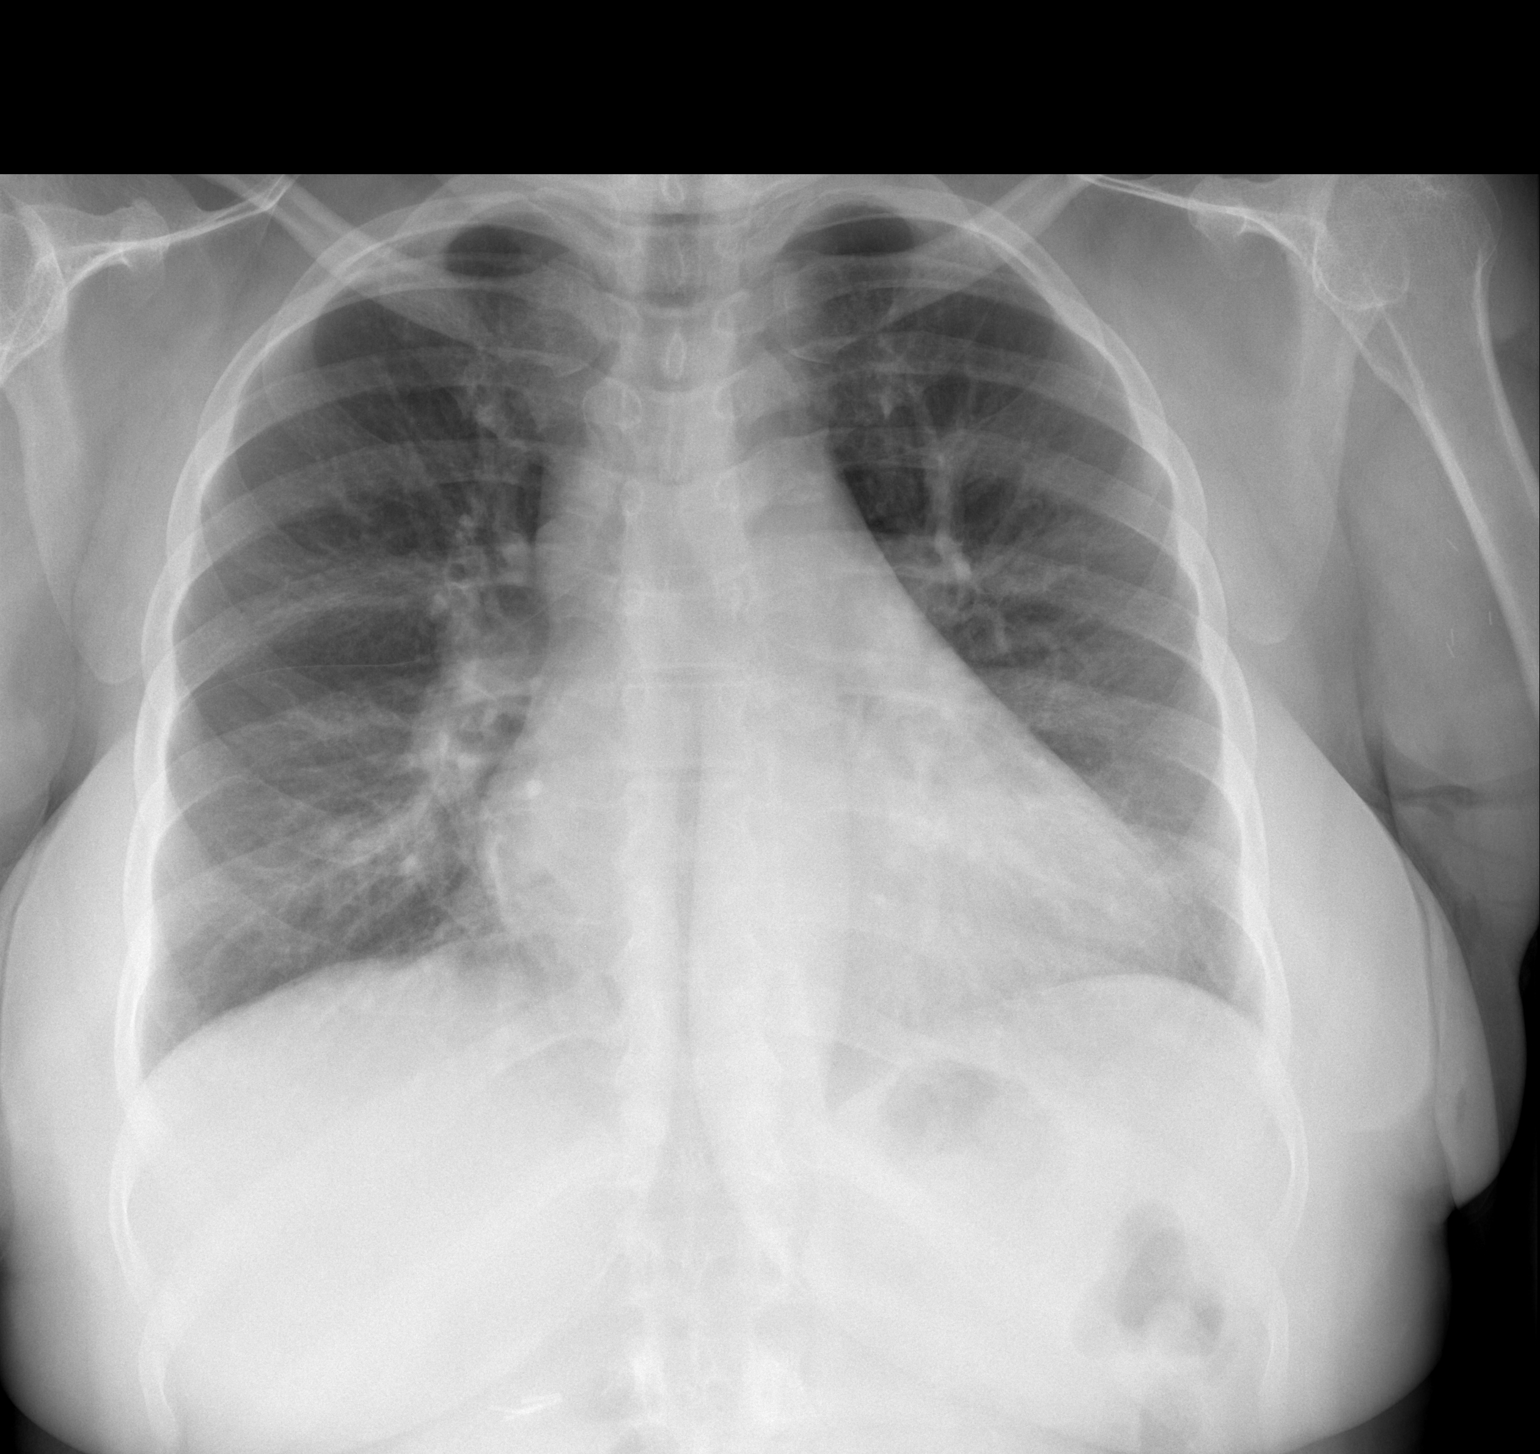

[w chest lat]
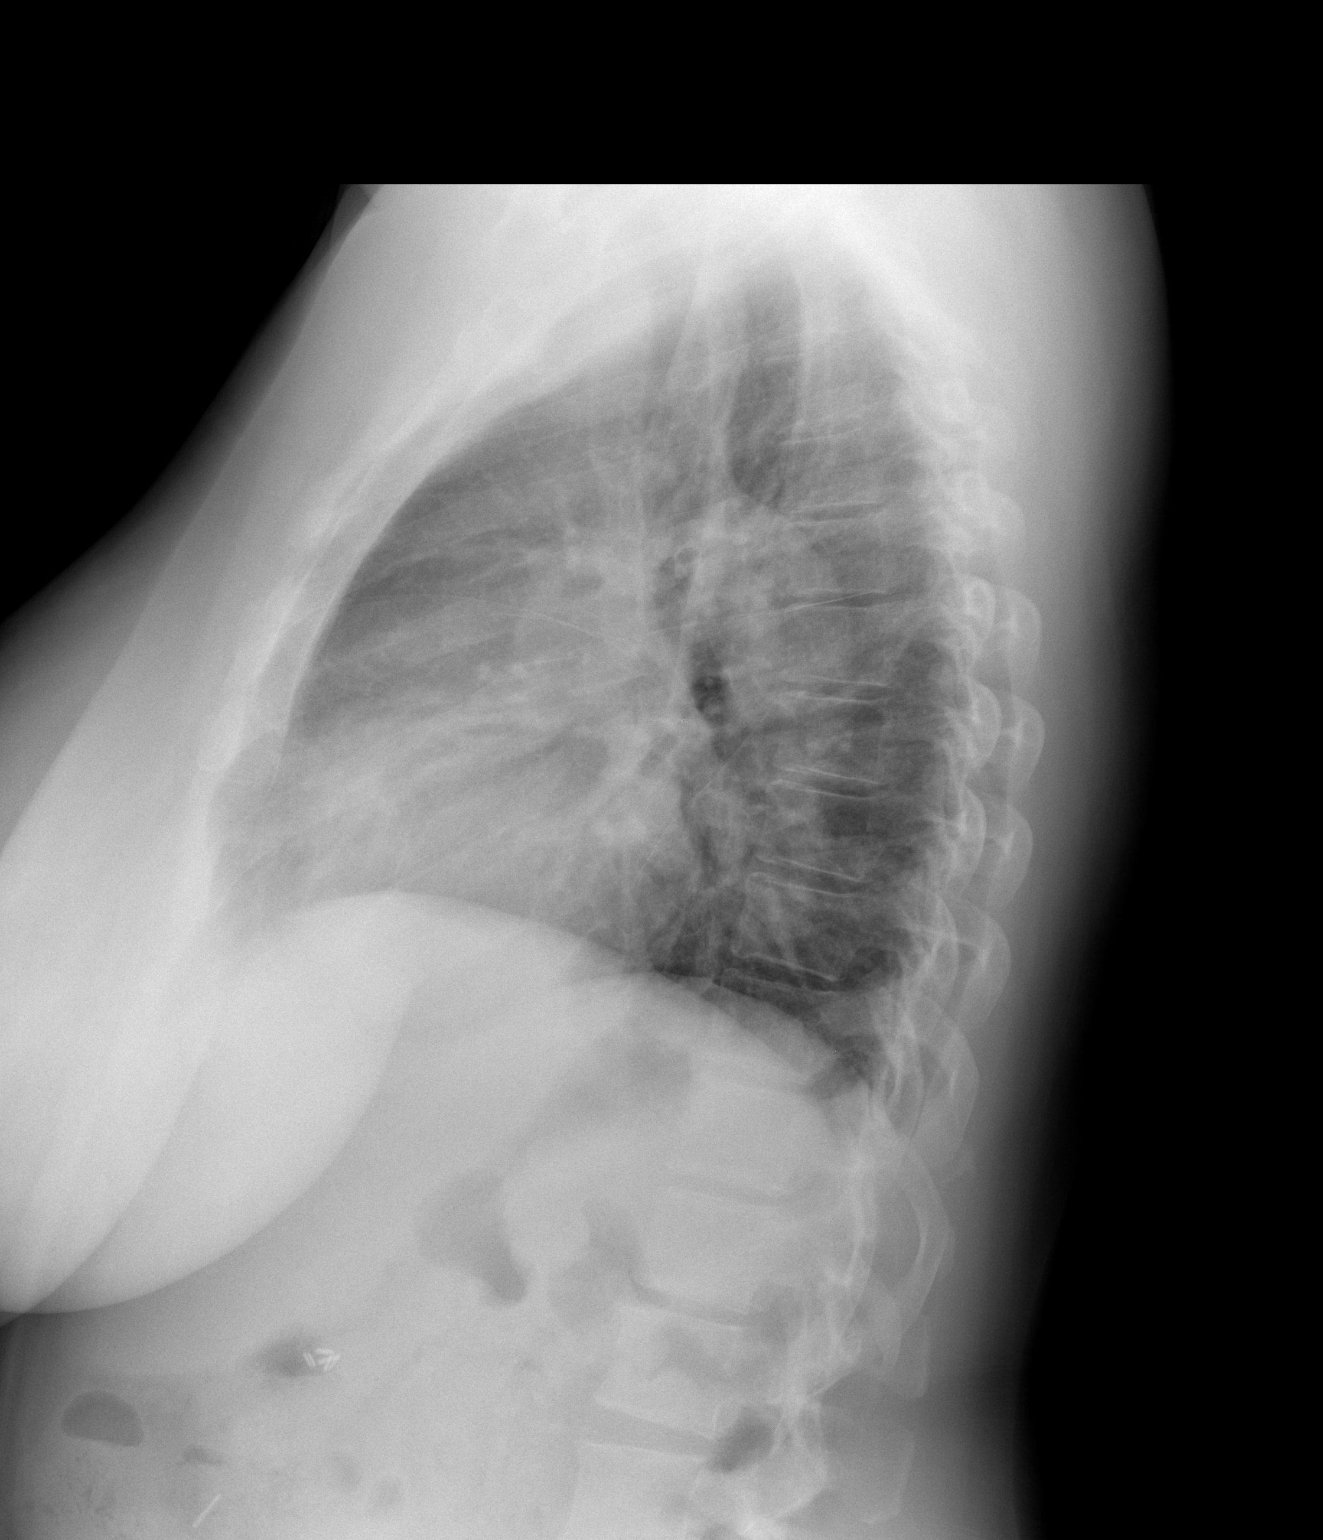

[2 of 2 positions shown; findings below may reference images not displayed]

FINDINGS: Mild cardiomegaly, new.  The vascularity is normal.
Increased peribronchial thickening with slight atelectasis in the
right middle lobe medially.

No effusions.  No acute osseous abnormalities.
IMPRESSION: Increased bronchitic changes.

## 2012-12-16 ENCOUNTER — Telehealth: Payer: Self-pay

## 2012-12-16 DIAGNOSIS — L819 Disorder of pigmentation, unspecified: Secondary | ICD-10-CM

## 2012-12-16 NOTE — Telephone Encounter (Addendum)
Phone call from pt. with c/o pain in right hand x approx. 3 weeks.  States there is a dark discoloration of right hand and it feels cold.  Also states isn't able to grip with the right hand.  Pt. Stated "I thought it would get better, but it isn't any better." Denies any swelling of right hand.  Will schedule appt. 7/23 for evaluatuion.  Discussed w/ Dr. Arbie Cookey.  Recommends to schedule right upper extremity arterial duplex, and have pt. see Dr. Edilia Bo tomorrow.

## 2012-12-17 ENCOUNTER — Ambulatory Visit (INDEPENDENT_AMBULATORY_CARE_PROVIDER_SITE_OTHER): Payer: Medicare Other | Admitting: Vascular Surgery

## 2012-12-17 ENCOUNTER — Encounter: Payer: Self-pay | Admitting: Vascular Surgery

## 2012-12-17 ENCOUNTER — Encounter: Payer: Self-pay | Admitting: *Deleted

## 2012-12-17 ENCOUNTER — Encounter (INDEPENDENT_AMBULATORY_CARE_PROVIDER_SITE_OTHER): Payer: Medicare Other | Admitting: *Deleted

## 2012-12-17 DIAGNOSIS — L819 Disorder of pigmentation, unspecified: Secondary | ICD-10-CM

## 2012-12-17 DIAGNOSIS — R209 Unspecified disturbances of skin sensation: Secondary | ICD-10-CM

## 2012-12-17 DIAGNOSIS — N186 End stage renal disease: Secondary | ICD-10-CM

## 2012-12-17 DIAGNOSIS — M79609 Pain in unspecified limb: Secondary | ICD-10-CM

## 2012-12-17 DIAGNOSIS — R238 Other skin changes: Secondary | ICD-10-CM

## 2012-12-17 DIAGNOSIS — M79643 Pain in unspecified hand: Secondary | ICD-10-CM | POA: Insufficient documentation

## 2012-12-17 NOTE — Progress Notes (Signed)
[facility] [place]  Patient name: [name] MRN: [id number] DOB: 01-Aug-1970 Sex: female  REASON FOR VISIT: Right arm pain.  HPI: [name] is a 42 y.o. female who has a functioning right thigh AV graft. She has a complicated access history. She previously undergone a right basilic vein transposition. She later developed a steal syndrome and underwent a DRIL or seizure which resolved her symptoms. However she gradually developed recurrent steal symptoms in her right upper extremity. On 11/16/2011 she had resection of her redundant segment and plication of her proximal fistula to try to salvage her fistula in the right arm. She continued to have symptoms and on 12/08/2011 the fistula had to be ligated.  She came in as an add-on today because she's been having pain in her right arm. She describes it as constant pain. On careful history she also has had some neck pain and paresthesias in her right arm.   REVIEW OF SYSTEMS: Arly.Keller ] denotes positive finding; [  ] denotes negative finding  CARDIOVASCULAR:  [ ]  chest pain   [ ]  dyspnea on exertion    CONSTITUTIONAL:  [ ]  fever   [ ]  chills  PHYSICAL EXAM: Filed Vitals:   12/17/12 1520  BP: 156/77  Pulse: 84  Resp: 16  Height: 5\' 1"  (1.549 m)  Weight: 158 lb (71.668 kg)  SpO2: 99%   Body mass index is 29.87 kg/(m^2). GENERAL: The patient is a well-nourished female, in no acute distress. The vital signs are documented above. CARDIOVASCULAR: There is a regular rate and rhythm  PULMONARY: There is good air exchange bilaterally without wheezing or rales. She has a palpable right radial pulse. There is no induration or evidence of phlebitis in her right arm.  She underwent a duplex scan today which shows that her native brachial artery, the bypass graft from her DRIL seizure, and the distal brachial artery and radial and ulnar arteries are patent.  MEDICAL ISSUES: I reassured her that I do not see any evidence of  arterial insufficiency which would explain her symptoms. Her bypass in the right arm is patent with a palpable radial pulse. Given that she is having some neck pain she could potentially be having some cervical disc problems. If her symptoms persist she will follow up with her primary care physician. I will see her back as needed.  [name] S [facility] [place] Beeper: [phone]

## 2012-12-24 ENCOUNTER — Ambulatory Visit (INDEPENDENT_AMBULATORY_CARE_PROVIDER_SITE_OTHER): Payer: Medicare Other | Admitting: Vascular Surgery

## 2012-12-24 ENCOUNTER — Encounter: Payer: Self-pay | Admitting: Vascular Surgery

## 2012-12-24 VITALS — BP 132/73 | HR 76 | Resp 16 | Ht 61.0 in | Wt 160.0 lb

## 2012-12-24 DIAGNOSIS — N186 End stage renal disease: Secondary | ICD-10-CM

## 2012-12-24 NOTE — Progress Notes (Signed)
Patient name: [name] MRN: [id number] DOB: 01/19/1971 Sex: female  REASON FOR VISIT: tender area over her right thigh AV graft to  HPI: [name] is a 42 y.o. female who I just saw last week with pain in her right arm. I felt that this was not related to poor circulation as her bypass graft in the arm was functioning well and she had no evidence of arterial insufficiency. I recommended evaluation by a neurologist. She comes in today because she states that she forgot to tell me that she had a small lump on her right thigh AV graft. She states that it was red yesterday although this may have been related to placing ice on it. She does not remember being stuck in this area. She denies fever or chills. Her graft has been working well.  REVIEW OF SYSTEMS: Arly.Keller ] denotes positive finding; [  ] denotes negative finding  CARDIOVASCULAR:  [ ]  chest pain   [ ]  dyspnea on exertion    CONSTITUTIONAL:  [ ]  fever   [ ]  chills  PHYSICAL EXAM: Filed Vitals:   12/24/12 0907  BP: 132/73  Pulse: 76  Resp: 16  Height: 5\' 1"  (1.549 m)  Weight: 160 lb (72.576 kg)   Body mass index is 30.25 kg/(m^2). GENERAL: The patient is a well-nourished female, in no acute distress. The vital signs are documented above. CARDIOVASCULAR: There is a regular rate and rhythm  PULMONARY: There is good air exchange bilaterally without wheezing or rales. Her right thigh AV graft has an excellent thrill. There is a small 1 cm lump along the arterial aspect of the graft with no erythema.  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 I do not see any evidence of infection. Would recommend simply following this and only consider exploration if it enlarged or showed evidence of infection. I've explained that if we simply operate now there is some increased risk of infection with each operation on the right thigh. She will call if she has any enlargement of the mass, fever, or  increased pain.  DICKSON,CHRISTOPHER S [facility] [place] Beeper: [phone]

## 2012-12-30 ENCOUNTER — Telehealth: Payer: Self-pay

## 2012-12-30 ENCOUNTER — Ambulatory Visit: Payer: Medicare Other | Admitting: Internal Medicine

## 2012-12-30 NOTE — Telephone Encounter (Signed)
Pt. Called to report increased tenderness, redness and swelling of right thigh AVG pseudoaneurysm site.  Stated last week at office visit, the area was swollen to about pea-size.  Today, reports that the swelling is almost grape-size.  Denies fever/chills, but c/o "body aches."   Verbalizes being scared about the swelling enlarging.   Reports that she bleeds after dialysis for a long time.   Denies bleeding in between treatments.  Will discuss with Dr. Edilia Bo.

## 2012-12-31 ENCOUNTER — Ambulatory Visit (INDEPENDENT_AMBULATORY_CARE_PROVIDER_SITE_OTHER): Payer: Medicare Other | Admitting: Vascular Surgery

## 2012-12-31 ENCOUNTER — Encounter: Payer: Self-pay | Admitting: Vascular Surgery

## 2012-12-31 VITALS — BP 165/71 | HR 94 | Resp 18 | Ht 61.0 in | Wt 155.0 lb

## 2012-12-31 DIAGNOSIS — N186 End stage renal disease: Secondary | ICD-10-CM

## 2012-12-31 NOTE — Progress Notes (Signed)
[facility]   Patient name: [name] MRN: [id number] DOB: 10/06/1970 Sex: female  REASON FOR VISIT: Pseudoaneurysm right thigh AV graft  HPI: [name] is a 42 y.o. female who I last saw on 12/24/2012 with a tender area over her right thigh AV graft. She does not remember being stuck in that area. The area did not look infected. I was hesitant to recommend surgery is area was fairly small and there is some risk of and graft infection with reoperation on the thigh graft. However, she returned today because she feels that the mass has enlarged and has become more tender. She denies fever or chills.  Past Medical History  Diagnosis Date  . Hyperlipidemia   . Alopecia   . Obesity   . Iron deficiency   . 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 RLS (restless legs syndrome)   . Respiratory failure   . Injury of conjunctiva and corneal abrasion of right eye without foreign body   . Cellulitis   . Dialysis patient     Tues; Thurs; Sat; [place]  . Angina   . 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 Anemia   . Pneumonia 2012    "double"  . Blood transfusion 2004    "when I had my transplant"  . Migraine   . 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 Hyperparathyroidism, secondary   . Diabetic retinopathy   . Hypertension     takes Metoprolol and Exforge daily  . Depression     takes Klonopin nightly  . Diabetes mellitus type 1     Pt states diagnosed at age 11 with prior episodes of DKA. S/p pancreatic transplant   . GERD (gastroesophageal reflux disease)     takes Protonix daily  . PONV (postoperative nausea and vomiting)   . Bronchitis   . Migraine   . Asthma   . Shortness of breath   . Emphysema of lung   . DKA (diabetic ketoacidoses) 07/02/2012    Family History  Problem Relation Age of Onset  .  Hypertension Mother   . Kidney disease Mother   . Diabetes Mother   . Hypertension Father   . Kidney disease Father   . Diabetes Father     SOCIAL HISTORY: History  Substance Use Topics  . Smoking status: Never Smoker   . Smokeless tobacco: Never Used  . Alcohol Use: No    Allergies  Allergen Reactions  . Morphine And Related Nausea And Vomiting and Other (See Comments)    "makes me delerirous"  . Penicillins Anaphylaxis  . Tramadol Nausea And Vomiting  . Depakote (Divalproex Sodium) Other (See Comments)    Pt gets "delirious"  . Vicodin (Hydrocodone-Acetaminophen) Rash    Current Outpatient Prescriptions  Medication Sig Dispense Refill  . albuterol (PROVENTIL HFA;VENTOLIN HFA) 108 (90 BASE) MCG/ACT inhaler Inhale 2 puffs into the lungs every 4 (four) hours as needed. For wheezing.       . aspirin EC 81 MG tablet Take 81 mg by mouth daily.      . calcium acetate (PHOSLO) 667 MG capsule Take 2,001-2,668 mg by mouth 3 (three) times daily with meals. Dosage depends on meal consumed      . clonazePAM (KLONOPIN) 0.5 MG tablet Take 1 mg by mouth at bedtime.       .   EXFORGE 10-160 MG per tablet Take 1 tablet by mouth at bedtime.       . Insulin Aspart (NOVOLOG FLEXPEN [place]) Inject 3-6 Units into the skin 3 (three) times daily before meals. Takes 6 units in am and at night, if pt eats lunch she will take insulin before she eats      . insulin glargine (LANTUS) 100 units/mL SOLN Inject 7 Units into the skin 2 (two) times daily.      . metoprolol succinate (TOPROL-XL) 100 MG 24 hr tablet Take 100 mg by mouth daily. Take with or immediately following a meal.      . mycophenolate (CELLCEPT) 250 MG capsule Take 250 mg by mouth 2 (two) times daily.      . pantoprazole (PROTONIX) 40 MG tablet Take 40 mg by mouth daily.      . predniSONE (DELTASONE) 5 MG tablet Take 1 tablet by mouth daily.      . tacrolimus (PROGRAF) 1 MG capsule Take 1 mg by mouth 2 (two) times daily.      . zolpidem  (AMBIEN) 10 MG tablet Take 10 mg by mouth at bedtime. For insomnia.      . [DISCONTINUED] NORVASC 10 MG tablet Take 1 tablet by mouth Daily.       No current facility-administered medications for this visit.    REVIEW OF SYSTEMS: [X ] denotes positive finding; [  ] denotes negative finding  CARDIOVASCULAR:  [ ] chest pain   [ ] chest pressure   [ ] palpitations   [ ] orthopnea   [ ] dyspnea on exertion   [ ] claudication   [ ] rest pain   [ ] DVT   [ ] phlebitis PULMONARY:   [ ] productive cough   [ ] asthma   [ ] wheezing NEUROLOGIC:   [ ] weakness  [ ] paresthesias  [ ] aphasia  [ ] amaurosis  [ ] dizziness HEMATOLOGIC:   [ ] bleeding problems   [ ] clotting disorders MUSCULOSKELETAL:  [ ] joint pain   [ ] joint swelling [ ] leg swelling GASTROINTESTINAL: [ ]  blood in stool  [ ]  hematemesis GENITOURINARY:  [ ]  dysuria  [ ]  hematuria PSYCHIATRIC:  [ ] history of major depression INTEGUMENTARY:  [ ] rashes  [ ] ulcers CONSTITUTIONAL:  [ ] fever   [ ] chills  PHYSICAL EXAM: Filed Vitals:   12/31/12 1703  BP: 165/71  Pulse: 94  Resp: 18  Height: 5' 1" (1.549 m)  Weight: 155 lb (70.308 kg)   Body mass index is 29.3 kg/(m^2). GENERAL: The patient is a well-nourished female, in no acute distress. The vital signs are documented above. CARDIOVASCULAR: There is a regular rate and rhythm.  PULMONARY: There is good air exchange bilaterally without wheezing or rales. ABDOMEN: Soft and non-tender with normal pitched bowel sounds.  MUSCULOSKELETAL: There are no major deformities or cyanosis. NEUROLOGIC: No focal weakness or paresthesias are detected. SKIN: There are no ulcers or rashes noted. PSYCHIATRIC: The patient has a normal affect. A right thigh AV graft has a good bruit and thrill. There is a 1-1/2 cm pulsatile mass over the proximal aspect of the arterial limb of her graft. There is no erythema. This area is tender.  MEDICAL ISSUES: Given that the small pseudoaneurysm in her  proximal right thigh AV graft appears to be enlarging and is also tender I have recommended that we explore this area   and repair this. This does not look infected on exam. We have discussed indications for surgery and the potential complications including the risk of bleeding problems or graft infection. She dialyzes Tuesdays Thursdays and Saturdays and her surgery is scheduled for Friday, 01/02/2013.  DICKSON,CHRISTOPHER S [facility] [place] Beeper: [phone]    

## 2012-12-31 NOTE — Telephone Encounter (Signed)
Discussed symptoms with Dr. Edilia Bo.  Recommended pt. come in this afternoon for further evaluation. Left voice message for pt. to come to office this afternoon.

## 2013-01-01 ENCOUNTER — Encounter: Payer: Self-pay | Admitting: Internal Medicine

## 2013-01-01 ENCOUNTER — Ambulatory Visit (INDEPENDENT_AMBULATORY_CARE_PROVIDER_SITE_OTHER): Payer: Medicare Other | Admitting: Internal Medicine

## 2013-01-01 ENCOUNTER — Other Ambulatory Visit: Payer: Self-pay

## 2013-01-01 ENCOUNTER — Encounter (HOSPITAL_COMMUNITY): Payer: Self-pay | Admitting: *Deleted

## 2013-01-01 VITALS — BP 124/64 | HR 82 | Temp 98.4°F | Ht 63.0 in | Wt 153.4 lb

## 2013-01-01 DIAGNOSIS — R05 Cough: Secondary | ICD-10-CM

## 2013-01-01 DIAGNOSIS — R059 Cough, unspecified: Secondary | ICD-10-CM

## 2013-01-01 MED ORDER — OXYCODONE-ACETAMINOPHEN 7.5-325 MG PO TABS: 1.0000 | ORAL_TABLET | ORAL | Status: DC | PRN

## 2013-01-01 MED ORDER — FAMOTIDINE 20 MG PO TABS: ORAL_TABLET | ORAL | Status: DC

## 2013-01-01 MED ORDER — VANCOMYCIN HCL IN DEXTROSE 1-5 GM/200ML-% IV SOLN
1000.0000 mg | INTRAVENOUS | Status: AC
  Administered 2013-01-02: 1000 mg via INTRAVENOUS

## 2013-01-01 NOTE — Assessment & Plan Note (Signed)
The most common causes of chronic cough in immunocompetent adults include the following: upper airway cough syndrome (UACS), previously referred to as postnasal drip syndrome (PNDS), which is caused by variety of rhinosinus conditions; (2) asthma; (3) GERD; (4) chronic bronchitis from cigarette smoking or other inhaled environmental irritants; (5) nonasthmatic eosinophilic bronchitis; and (6) bronchiectasis.   These conditions, singly or in combination, have accounted for up to 94% of the causes of chronic cough in prospective studies.   Other conditions have constituted no >6% of the causes in prospective studies These have included bronchogenic carcinoma, chronic interstitial pneumonia, sarcoidosis, left ventricular failure, ACEI-induced cough, and aspiration from a condition associated with pharyngeal dysfunction.    Chronic cough is often simultaneously caused by more than one condition. A single cause has been found from 38 to 82% of the time, multiple causes from 18 to 62%. Multiply caused cough has been the result of three diseases up to 42% of the time.       Of the three most common causes of chronic cough, only one (GERD)  can actually cause the other two (asthma and post nasal drip syndrome)  and perpetuate the cylce of cough inducing airway trauma, inflammation, heightened sensitivity to reflux which is prompted by the cough itself via a cyclical mechanism.    This may partially respond to steroids and look like asthma and post nasal drainage but never erradicated completely unless the cough and the secondary reflux are eliminated, preferably both at the same time.  While not intuitively obvious, many patients with chronic low grade reflux do not cough until there is a secondary insult that disturbs the protective epithelial barrier and exposes sensitive nerve endings.  This can be viral or direct physical injury such as with an endotracheal tube.   The point is that once this occurs, it is  difficult to eliminate using anything but a maximally effective acid suppression regimen at least in the short run, accompanied by an appropriate diet to address non acid GERD.   Will treat again with cyclical cough regimen then return in 2 weeks to be sure on approp maint rx.   To keep things simple, I have asked the patient to first separate medicines that are perceived as maintenance, that is to be taken daily "no matter what", from those medicines that are taken on only on an as-needed basis and I have given the patient examples of both, and then return to see our NP to generate a  detailed  medication calendar which should be followed until the next physician sees the patient and updates it.

## 2013-01-01 NOTE — Progress Notes (Signed)
Subjective:    Patient ID: [name], female    DOB: 1971-05-05, 42 y.o.   MRN: [id number]    Brief patient profile:  42 yobf never smoker never asthma on HD since 2008  referred by Dr Caryn Section for  Chronic cough and sob in Oct 2012  03/23/2011 Initial pulmonary office eval  on ACEI  cc cough x 6 months admitted twice with dx of pneumonia but cough never resolved worse when lie down does not tend to wake up mostly dry. 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   Assoc with chest discomfort worsened with coughing. Mild assoc nasal congestion/ sneezing but no purulent secretions, sinus pain, tooth problems, overt hb. Better only with narcotics. No previous hx of seasonal rhinitis overt HB or allergies/ asthma.  rec Stop lisinopril and norvasc Start exforge 10/160 one daily  GERD (REFLUX)   04/23/2011 f/u ov/Wert back on ace x one week cc patient states having pain up under her right breast productive of minimal white mucus, slt feverish over weekend with assoc wheezing,  Better on day of ov s rx better with albuterol.   No overt sinus or hb symptoms rec Add pepcid 20mg  one at bedtime and continue the protonix 40 mg Take 30-60 min before first meal of the day  GERD diet Only use the proiare if you can't catch your breath Use delsym for cough   09/28/2011 f/u ov/Wert :  No show  10/17/2011 f/u ov/Wert No show  11/28/2011 f/u ov/Wert No Show   11/03/2012 f/u ov/Wert re  Cough/sob baseline only saba a few times a month not really sure of all her meds Chief Complaint  Patient presents with  . Acute Visit    Increased SOB for the past 3 months. She also c/o increased cough, sometimes to the point of vomiting.    using albuterol up to 4 x daily but not at all on day of ov assoc dry cough x 3 months, coughs so hard she vomits. rx cyclical cough protocol    01/01/2013 f/u ov/Wert completely resolved cough x one month ? adeherent with gerd maint  rx Chief Complaint  Patient presents with  . Acute Visit    Pt c/o increased cough x 2 wks- occ prod with minimal clear sputum.  Sometimes cough is so severe she vomits.   cough is no pattern, not worse with meals, not better with inhaler,   Not better p hd  Or worse pre hd.no sob unless cough.  Cough was indolent onset, progressive, now daily  No obvious daytime variabilty or assoc  cp or chest tightness, subjective wheeze overt sinus or hb symptoms. No unusual exp hx or h/o childhood pna/ asthma or knowledge of premature birth.    Sleeping ok without nocturnal  or early am exacerbation  of respiratory  c/o's or need for noct saba. Also denies any obvious fluctuation of symptoms with weather or environmental changes or other aggravating or alleviating factors except as outlined above  ROS  At present neg for  any significant sore throat, dysphagia, itching, sneezing,  nasal congestion or excess pnds but no purulent secretions,  fever, chills, sweats, unintended wt loss, pleuritic or exertional cp, hempoptysis, orthopnea pnd or leg swelling.  Also denies presyncope, palpitations, heartburn, abdominal pain, nausea, vomiting, diarrhea  or change in bowel or urinary habits, dysuria,hematuria,  rash, arthralgias, visual complaints, headache, numbness weakness or ataxia.  Objective:   Physical Exam  Still  hoarse pleasant mod amb obese bf nad with minimal pseudowheeze and ? Belle indifference" with no cough at all during interview and exam   Wt 160 03/23/11 >  161 04/23/2011 >  11/03/2012  165 > 01/01/2013  153   HEENT: nl dentition, turbinates, and orophanx. Nl external ear canals without cough reflex   NECK :  without JVD/Nodes/TM/ nl carotid upstrokes bilaterally   LUNGS: no acc muscle use, completley  clear to A and P bilaterally without cough on insp or exp maneuvers   CV:  RRR  no s3 or murmur or increase in P2, no edema   ABD:  soft and nontender with nl excursion in the  supine position. No bruits or organomegaly, bowel sounds nl  MS:  warm without deformities, calf tenderness, cyanosis or clubbing      11/18/12   Findings compatible with mild CHF/volume overload with interstitial  pulmonary edema. .  [facility] at [facility] "in the last few weeks" = "ok"     Assessment & Plan:

## 2013-01-01 NOTE — Patient Instructions (Addendum)
Take delsym two tsp every 12 hours and supplement if needed with  Percocet up to 1 every 4 hours to suppress the urge to cough. Swallowing water or using ice chips/non mint and menthol containing candies (such as lifesavers or sugarless jolly ranchers) are also effective.  You should rest your voice and avoid activities that you know make you cough.  Once you have eliminated the cough for 3 straight days stop percocet first,  then the delsym as tolerated.    Pantoprazole (protonix) 40 mg   Take 30-60 min before first meal of the day and Pepcid 20 mg one bedtime along with chlortrimeton 4 mg at bedtime   See Tammy NP w/in 2 weeks with all your medications, even over the counter meds, separated in two separate bags, the ones you take no matter what vs the ones you stop once you feel better and take only as needed when you feel you need them.   Tammy  will generate for you a new user friendly medication calendar that will put [place] all on the same page re: your medication use.     Without this process, it simply isn't possible to assure that we are providing  your outpatient care  with  the attention to detail we feel you deserve.   If we cannot assure that you're getting that kind of care,  then we cannot manage your problem effectively from this clinic.  Once you have seen Tammy and we are sure that we're all on the same page with your medication use she will arrange follow up with me.

## 2013-01-02 ENCOUNTER — Encounter (HOSPITAL_COMMUNITY): Payer: Self-pay | Admitting: Anesthesiology

## 2013-01-02 ENCOUNTER — Ambulatory Visit (HOSPITAL_COMMUNITY)
Admission: RE | Admit: 2013-01-02 | Discharge: 2013-01-02 | Disposition: A | Discharge status: Home / Self Care | Payer: Medicare Other | Source: Ambulatory Visit | Attending: Vascular Surgery | Admitting: Vascular Surgery

## 2013-01-02 ENCOUNTER — Telehealth: Payer: Self-pay | Admitting: Vascular Surgery

## 2013-01-02 ENCOUNTER — Ambulatory Visit (HOSPITAL_COMMUNITY): Payer: Medicare Other

## 2013-01-02 ENCOUNTER — Encounter (HOSPITAL_COMMUNITY)
Admission: RE | Disposition: A | Discharge status: Home / Self Care | Payer: Self-pay | Source: Ambulatory Visit | Attending: Vascular Surgery

## 2013-01-02 ENCOUNTER — Ambulatory Visit (HOSPITAL_COMMUNITY): Payer: Medicare Other | Admitting: Anesthesiology

## 2013-01-02 DIAGNOSIS — D509 Iron deficiency anemia, unspecified: Secondary | ICD-10-CM | POA: Insufficient documentation

## 2013-01-02 DIAGNOSIS — J40 Bronchitis, not specified as acute or chronic: Secondary | ICD-10-CM | POA: Insufficient documentation

## 2013-01-02 DIAGNOSIS — G43909 Migraine, unspecified, not intractable, without status migrainosus: Secondary | ICD-10-CM | POA: Insufficient documentation

## 2013-01-02 DIAGNOSIS — Z888 Allergy status to other drugs, medicaments and biological substances status: Secondary | ICD-10-CM | POA: Insufficient documentation

## 2013-01-02 DIAGNOSIS — F3289 Other specified depressive episodes: Secondary | ICD-10-CM | POA: Insufficient documentation

## 2013-01-02 DIAGNOSIS — N2581 Secondary hyperparathyroidism of renal origin: Secondary | ICD-10-CM | POA: Insufficient documentation

## 2013-01-02 DIAGNOSIS — E11319 Type 2 diabetes mellitus with unspecified diabetic retinopathy without macular edema: Secondary | ICD-10-CM | POA: Insufficient documentation

## 2013-01-02 DIAGNOSIS — Z79899 Other long term (current) drug therapy: Secondary | ICD-10-CM | POA: Insufficient documentation

## 2013-01-02 DIAGNOSIS — Z8701 Personal history of pneumonia (recurrent): Secondary | ICD-10-CM | POA: Insufficient documentation

## 2013-01-02 DIAGNOSIS — IMO0002 Reserved for concepts with insufficient information to code with codable children: Secondary | ICD-10-CM | POA: Insufficient documentation

## 2013-01-02 DIAGNOSIS — G2581 Restless legs syndrome: Secondary | ICD-10-CM | POA: Insufficient documentation

## 2013-01-02 DIAGNOSIS — J4489 Other specified chronic obstructive pulmonary disease: Secondary | ICD-10-CM | POA: Insufficient documentation

## 2013-01-02 DIAGNOSIS — I898 Other specified noninfective disorders of lymphatic vessels and lymph nodes: Secondary | ICD-10-CM

## 2013-01-02 DIAGNOSIS — K219 Gastro-esophageal reflux disease without esophagitis: Secondary | ICD-10-CM | POA: Insufficient documentation

## 2013-01-02 DIAGNOSIS — E669 Obesity, unspecified: Secondary | ICD-10-CM | POA: Insufficient documentation

## 2013-01-02 DIAGNOSIS — Z885 Allergy status to narcotic agent status: Secondary | ICD-10-CM | POA: Insufficient documentation

## 2013-01-02 DIAGNOSIS — I120 Hypertensive chronic kidney disease with stage 5 chronic kidney disease or end stage renal disease: Secondary | ICD-10-CM | POA: Insufficient documentation

## 2013-01-02 DIAGNOSIS — F329 Major depressive disorder, single episode, unspecified: Secondary | ICD-10-CM | POA: Insufficient documentation

## 2013-01-02 DIAGNOSIS — E1029 Type 1 diabetes mellitus with other diabetic kidney complication: Secondary | ICD-10-CM | POA: Insufficient documentation

## 2013-01-02 DIAGNOSIS — Z880 Allergy status to penicillin: Secondary | ICD-10-CM | POA: Insufficient documentation

## 2013-01-02 DIAGNOSIS — N186 End stage renal disease: Secondary | ICD-10-CM

## 2013-01-02 DIAGNOSIS — E785 Hyperlipidemia, unspecified: Secondary | ICD-10-CM | POA: Insufficient documentation

## 2013-01-02 DIAGNOSIS — E1039 Type 1 diabetes mellitus with other diabetic ophthalmic complication: Secondary | ICD-10-CM | POA: Insufficient documentation

## 2013-01-02 DIAGNOSIS — J438 Other emphysema: Secondary | ICD-10-CM | POA: Insufficient documentation

## 2013-01-02 DIAGNOSIS — Z7982 Long term (current) use of aspirin: Secondary | ICD-10-CM | POA: Insufficient documentation

## 2013-01-02 DIAGNOSIS — J449 Chronic obstructive pulmonary disease, unspecified: Secondary | ICD-10-CM | POA: Insufficient documentation

## 2013-01-02 HISTORY — PX: FALSE ANEURYSM REPAIR: SHX5152

## 2013-01-02 LAB — POCT I-STAT 4, (NA,K, GLUC, HGB,HCT)
Glucose, Bld: 165 mg/dL — ABNORMAL HIGH (ref 70–99)
HCT: 37 % (ref 36.0–46.0)
Hemoglobin: 12.6 g/dL (ref 12.0–15.0)
Sodium: 135 mEq/L (ref 135–145)

## 2013-01-02 LAB — GLUCOSE, CAPILLARY: Glucose-Capillary: 233 mg/dL — ABNORMAL HIGH (ref 70–99)

## 2013-01-02 SURGERY — REPAIR, PSEUDOANEURYSM
Anesthesia: General | Site: Leg Upper | Laterality: Right | Wound class: Clean

## 2013-01-02 MED ORDER — FENTANYL CITRATE 0.05 MG/ML IJ SOLN
INTRAMUSCULAR | Status: AC
  Administered 2013-01-02: 25 ug via INTRAVENOUS
  Filled 2013-01-02: qty 2

## 2013-01-02 MED ORDER — FENTANYL CITRATE 0.05 MG/ML IJ SOLN
25.0000 ug | INTRAMUSCULAR | Status: DC | PRN
  Administered 2013-01-02: 50 ug via INTRAVENOUS
  Administered 2013-01-02: 25 ug via INTRAVENOUS

## 2013-01-02 MED ORDER — SODIUM CHLORIDE 0.9 % IR SOLN
Status: DC | PRN
  Administered 2013-01-02: 10:00:00

## 2013-01-02 MED ORDER — PROPOFOL 10 MG/ML IV BOLUS
INTRAVENOUS | Status: DC | PRN
  Administered 2013-01-02: 150 mg via INTRAVENOUS

## 2013-01-02 MED ORDER — MIDAZOLAM HCL 2 MG/2ML IJ SOLN: 1.0000 mg | INTRAMUSCULAR | Status: DC | PRN

## 2013-01-02 MED ORDER — 0.9 % SODIUM CHLORIDE (POUR BTL) OPTIME
TOPICAL | Status: DC | PRN
  Administered 2013-01-02: 1000 mL

## 2013-01-02 MED ORDER — PHENYLEPHRINE HCL 10 MG/ML IJ SOLN
INTRAMUSCULAR | Status: DC | PRN
  Administered 2013-01-02 (×3): 80 ug via INTRAVENOUS

## 2013-01-02 MED ORDER — LIDOCAINE HCL (CARDIAC) 20 MG/ML IV SOLN
INTRAVENOUS | Status: DC | PRN
  Administered 2013-01-02: 50 mg via INTRAVENOUS

## 2013-01-02 MED ORDER — SODIUM CHLORIDE 0.9 % IV SOLN
INTRAVENOUS | Status: DC | PRN
  Administered 2013-01-02: 10:00:00 via INTRAVENOUS

## 2013-01-02 MED ORDER — ARTIFICIAL TEARS OP OINT
TOPICAL_OINTMENT | OPHTHALMIC | Status: DC | PRN
  Administered 2013-01-02: 1 via OPHTHALMIC

## 2013-01-02 MED ORDER — SODIUM CHLORIDE 0.9 % IV SOLN: INTRAVENOUS | Status: DC

## 2013-01-02 MED ORDER — LIDOCAINE-EPINEPHRINE (PF) 1 %-1:200000 IJ SOLN
INTRAMUSCULAR | Status: DC | PRN
  Administered 2013-01-02: 1 mL via INTRADERMAL

## 2013-01-02 MED ORDER — SODIUM CHLORIDE 0.9 % IV SOLN
INTRAVENOUS | Status: DC
  Administered 2013-01-02: 20 mL/h via INTRAVENOUS

## 2013-01-02 MED ORDER — LIDOCAINE-EPINEPHRINE (PF) 1 %-1:200000 IJ SOLN
INTRAMUSCULAR | Status: AC
  Filled 2013-01-02: qty 10

## 2013-01-02 MED ORDER — PROMETHAZINE HCL 25 MG/ML IJ SOLN: 6.2500 mg | INTRAMUSCULAR | Status: DC | PRN

## 2013-01-02 MED ORDER — PROMETHAZINE HCL 25 MG/ML IJ SOLN
INTRAMUSCULAR | Status: AC
  Administered 2013-01-02: 6.25 mg via INTRAVENOUS
  Filled 2013-01-02: qty 1

## 2013-01-02 MED ORDER — OXYCODONE-ACETAMINOPHEN 5-325 MG PO TABS: 1.0000 | ORAL_TABLET | ORAL | Status: DC | PRN

## 2013-01-02 MED ORDER — LIDOCAINE HCL (PF) 1 % IJ SOLN
INTRAMUSCULAR | Status: AC
  Filled 2013-01-02: qty 30

## 2013-01-02 MED ORDER — ALBUTEROL SULFATE HFA 108 (90 BASE) MCG/ACT IN AERS
INHALATION_SPRAY | RESPIRATORY_TRACT | Status: DC | PRN
  Administered 2013-01-02: 2 via RESPIRATORY_TRACT

## 2013-01-02 MED ORDER — FENTANYL CITRATE 0.05 MG/ML IJ SOLN
INTRAMUSCULAR | Status: DC | PRN
  Administered 2013-01-02: 50 ug via INTRAVENOUS

## 2013-01-02 MED ORDER — ONDANSETRON HCL 4 MG/2ML IJ SOLN
INTRAMUSCULAR | Status: DC | PRN
  Administered 2013-01-02: 4 mg via INTRAVENOUS

## 2013-01-02 MED ORDER — MIDAZOLAM HCL 5 MG/5ML IJ SOLN
INTRAMUSCULAR | Status: DC | PRN
  Administered 2013-01-02: 2 mg via INTRAVENOUS

## 2013-01-02 MED ORDER — FENTANYL CITRATE 0.05 MG/ML IJ SOLN: 50.0000 ug | Freq: Once | INTRAMUSCULAR | Status: DC

## 2013-01-02 SURGICAL SUPPLY — 51 items
BANDAGE ELASTIC 4 VELCRO ST LF (GAUZE/BANDAGES/DRESSINGS) IMPLANT
BANDAGE ESMARK 6X9 LF (GAUZE/BANDAGES/DRESSINGS) IMPLANT
BNDG ESMARK 6X9 LF (GAUZE/BANDAGES/DRESSINGS)
CANISTER SUCTION 2500CC (MISCELLANEOUS) ×2 IMPLANT
CLIP TI MEDIUM 24 (CLIP) ×2 IMPLANT
CLIP TI WIDE RED SMALL 24 (CLIP) ×2 IMPLANT
CLOTH BEACON ORANGE TIMEOUT ST (SAFETY) ×2 IMPLANT
COVER SURGICAL LIGHT HANDLE (MISCELLANEOUS) ×2 IMPLANT
CUFF TOURNIQUET SINGLE 18IN (TOURNIQUET CUFF) IMPLANT
CUFF TOURNIQUET SINGLE 24IN (TOURNIQUET CUFF) IMPLANT
CUFF TOURNIQUET SINGLE 34IN LL (TOURNIQUET CUFF) IMPLANT
CUFF TOURNIQUET SINGLE 44IN (TOURNIQUET CUFF) IMPLANT
DERMABOND ADVANCED (GAUZE/BANDAGES/DRESSINGS) ×1
DERMABOND ADVANCED .7 DNX12 (GAUZE/BANDAGES/DRESSINGS) ×1 IMPLANT
DRAIN CHANNEL 15F RND FF W/TCR (WOUND CARE) IMPLANT
DRAPE WARM FLUID 44X44 (DRAPE) IMPLANT
DRSG COVADERM 4X8 (GAUZE/BANDAGES/DRESSINGS) IMPLANT
ELECT REM PT RETURN 9FT ADLT (ELECTROSURGICAL) ×2
ELECTRODE REM PT RTRN 9FT ADLT (ELECTROSURGICAL) ×1 IMPLANT
EVACUATOR SILICONE 100CC (DRAIN) IMPLANT
GLOVE BIO SURGEON STRL SZ7.5 (GLOVE) ×2 IMPLANT
GLOVE BIOGEL PI IND STRL 6.5 (GLOVE) ×3 IMPLANT
GLOVE BIOGEL PI IND STRL 8 (GLOVE) ×1 IMPLANT
GLOVE BIOGEL PI INDICATOR 6.5 (GLOVE) ×3
GLOVE BIOGEL PI INDICATOR 8 (GLOVE) ×1
GLOVE ECLIPSE 6.5 STRL STRAW (GLOVE) ×2 IMPLANT
GLOVE SURG SS PI 6.5 STRL IVOR (GLOVE) ×2 IMPLANT
GOWN PREVENTION PLUS XXLARGE (GOWN DISPOSABLE) ×2 IMPLANT
GOWN STRL NON-REIN LRG LVL3 (GOWN DISPOSABLE) ×4 IMPLANT
KIT BASIN OR (CUSTOM PROCEDURE TRAY) ×2 IMPLANT
KIT ROOM TURNOVER OR (KITS) ×2 IMPLANT
NEEDLE HYPO 25GX1X1/2 BEV (NEEDLE) ×2 IMPLANT
NS IRRIG 1000ML POUR BTL (IV SOLUTION) ×4 IMPLANT
PACK PERIPHERAL VASCULAR (CUSTOM PROCEDURE TRAY) ×2 IMPLANT
PAD ARMBOARD 7.5X6 YLW CONV (MISCELLANEOUS) ×4 IMPLANT
PADDING CAST COTTON 6X4 STRL (CAST SUPPLIES) IMPLANT
SPONGE SURGIFOAM ABS GEL 100 (HEMOSTASIS) IMPLANT
STAPLER VISISTAT 35W (STAPLE) IMPLANT
SUT PROLENE 5 0 C 1 24 (SUTURE) ×2 IMPLANT
SUT PROLENE 6 0 BV (SUTURE) ×2 IMPLANT
SUT SILK 2 0 FS (SUTURE) IMPLANT
SUT VIC AB 2-0 CTB1 (SUTURE) ×2 IMPLANT
SUT VIC AB 3-0 SH 27 (SUTURE) ×1
SUT VIC AB 3-0 SH 27X BRD (SUTURE) ×1 IMPLANT
SUT VICRYL 4-0 PS2 18IN ABS (SUTURE) ×2 IMPLANT
SYR CONTROL 10ML LL (SYRINGE) ×2 IMPLANT
TOWEL OR 17X24 6PK STRL BLUE (TOWEL DISPOSABLE) ×4 IMPLANT
TOWEL OR 17X26 10 PK STRL BLUE (TOWEL DISPOSABLE) ×2 IMPLANT
TRAY FOLEY CATH 16FRSI W/METER (SET/KITS/TRAYS/PACK) IMPLANT
UNDERPAD 30X30 INCONTINENT (UNDERPADS AND DIAPERS) IMPLANT
WATER STERILE IRR 1000ML POUR (IV SOLUTION) ×2 IMPLANT

## 2013-01-02 NOTE — Telephone Encounter (Signed)
Message copied by Margaretmary Eddy on Fri Jan 02, 2013 12:45 PM ------      Message from: Melene Plan      Created: Fri Jan 02, 2013 12:24 PM      Regarding: FW: charge                   ----- Message -----         From: Chuck Hint, MD         Sent: 01/02/2013  10:55 AM           To: Reuel Derby, Melene Plan, RN, #      Subject: charge                                                   PROCEDURE: excision of lymphocele right thigh AV            SURGEON: Di Kindle. Edilia Bo, MD, FACS            ASSIST: Emilio Aspen RNFA            Follow up prn.      Thanks       CD ------

## 2013-01-02 NOTE — Anesthesia Procedure Notes (Signed)
Procedure Name: LMA Insertion Date/Time: 01/02/2013 10:10 AM Performed by: An Schnabel, Nuala Alpha Pre-anesthesia Checklist: Patient identified, Emergency Drugs available, Suction available and Patient being monitored Patient Re-evaluated:Patient Re-evaluated prior to inductionOxygen Delivery Method: Circle system utilized Preoxygenation: Pre-oxygenation with 100% oxygen Intubation Type: IV induction LMA: LMA with gastric port inserted LMA Size: 4.0 Tube type: Oral Number of attempts: 1 Placement Confirmation: positive ETCO2 and breath sounds checked- equal and bilateral Tube secured with: Tape Dental Injury: Teeth and Oropharynx as per pre-operative assessment

## 2013-01-02 NOTE — Anesthesia Preprocedure Evaluation (Signed)
Anesthesia Evaluation  Patient identified by MRN, date of birth, ID band Patient awake    Reviewed: Allergy & Precautions, H&P , NPO status , Patient's Chart, lab work & pertinent test results  Airway Mallampati: I TM Distance: >3 FB Neck ROM: Full    Dental   Pulmonary shortness of breath, asthma , COPD + rhonchi         Cardiovascular hypertension, Pt. on medications + angina Rhythm:Regular Rate:Normal     Neuro/Psych  Headaches, Depression    GI/Hepatic GERD-  ,  Endo/Other  diabetes  Renal/GU ESRF and DialysisRenal disease     Musculoskeletal   Abdominal   Peds  Hematology   Anesthesia Other Findings   Reproductive/Obstetrics                           Anesthesia Physical Anesthesia Plan  ASA: III  Anesthesia Plan: General   Post-op Pain Management:    Induction: Intravenous  Airway Management Planned: LMA  Additional Equipment:   Intra-op Plan:   Post-operative Plan: Extubation in OR  Informed Consent: I have reviewed the patients History and Physical, chart, labs and discussed the procedure including the risks, benefits and alternatives for the proposed anesthesia with the patient or authorized representative who has indicated his/her understanding and acceptance.     Plan Discussed with: CRNA and Surgeon  Anesthesia Plan Comments:         Anesthesia Quick Evaluation

## 2013-01-02 NOTE — Progress Notes (Signed)
Report given to Angel RN.

## 2013-01-02 NOTE — Transfer of Care (Signed)
Immediate Anesthesia Transfer of Care Note  Patient: [name]  Procedure(s) Performed: Procedure(s): Excision of lymphocele in right thigh. (Right)  Patient Location: PACU  Anesthesia Type:General  Level of Consciousness: awake, alert  and oriented  Airway & Oxygen Therapy: Patient Spontanous Breathing and Patient connected to face mask oxygen  Post-op Assessment: Report given to PACU RN  Post vital signs: Reviewed and stable  Complications: No apparent anesthesia complications

## 2013-01-02 NOTE — Anesthesia Postprocedure Evaluation (Signed)
  Anesthesia Post-op Note  Patient: [name]  Procedure(s) Performed: Procedure(s): Excision of lymphocele in right thigh. (Right)  Patient Location: PACU  Anesthesia Type:General  Level of Consciousness: awake and alert   Airway and Oxygen Therapy: Patient Spontanous Breathing  Post-op Pain: mild  Post-op Assessment: Post-op Vital signs reviewed, Patient's Cardiovascular Status Stable, Respiratory Function Stable, Patent Airway, No signs of Nausea or vomiting and Pain level controlled  Post-op Vital Signs: stable  Complications: No apparent anesthesia complications

## 2013-01-02 NOTE — H&P (View-Only) (Signed)
[facility] [place]  Patient name: [name] MRN: [id number] DOB: 1970-10-18 Sex: female  REASON FOR VISIT: Pseudoaneurysm right thigh AV graft  HPI: [name] is a 42 y.o. female who I last saw on 12/24/2012 with a tender area over her right thigh AV graft. She does not remember being stuck in that area. The area did not look infected. I was hesitant to recommend surgery is area was fairly small and there is some risk of and graft infection with reoperation on the thigh graft. However, she returned today because she feels that the mass has enlarged and has become more tender. She denies fever or chills.  Past Medical History  Diagnosis Date  . Hyperlipidemia   . Alopecia   . Obesity   . Iron deficiency   . 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 RLS (restless legs syndrome)   . Respiratory failure   . Injury of conjunctiva and corneal abrasion of right eye without foreign body   . Cellulitis   . Dialysis patient     Tues; Thurs; Sat; [place]  . Angina   . 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 Anemia   . Pneumonia 2012    "double"  . Blood transfusion 2004    "when I had my transplant"  . Migraine   . 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 Hyperparathyroidism, secondary   . Diabetic retinopathy   . Hypertension     takes Metoprolol and Exforge daily  . Depression     takes Klonopin nightly  . Diabetes mellitus type 1     Pt states diagnosed at age 18 with prior episodes of DKA. S/p pancreatic transplant   . GERD (gastroesophageal reflux disease)     takes Protonix daily  . PONV (postoperative nausea and vomiting)   . Bronchitis   . Migraine   . Asthma   . Shortness of breath   . Emphysema of lung   . DKA (diabetic ketoacidoses) 07/02/2012    Family History  Problem Relation Age of Onset  .  Hypertension Mother   . Kidney disease Mother   . Diabetes Mother   . Hypertension Father   . Kidney disease Father   . Diabetes Father     SOCIAL HISTORY: History  Substance Use Topics  . Smoking status: Never Smoker   . Smokeless tobacco: Never Used  . Alcohol Use: No    Allergies  Allergen Reactions  . Morphine And Related Nausea And Vomiting and Other (See Comments)    "makes me delerirous"  . Penicillins Anaphylaxis  . Tramadol Nausea And Vomiting  . Depakote (Divalproex Sodium) Other (See Comments)    Pt gets "delirious"  . Vicodin (Hydrocodone-Acetaminophen) Rash    Current Outpatient Prescriptions  Medication Sig Dispense Refill  . albuterol (PROVENTIL HFA;VENTOLIN HFA) 108 (90 BASE) MCG/ACT inhaler Inhale 2 puffs into the lungs every 4 (four) hours as needed. For wheezing.       Marland Kitchen aspirin EC 81 MG tablet Take 81 mg by mouth daily.      . calcium acetate (PHOSLO) 667 MG capsule Take 2,001-2,668 mg by mouth 3 (three) times daily with meals. Dosage depends on meal consumed      . clonazePAM (KLONOPIN) 0.5 MG tablet Take 1 mg by mouth at bedtime.       Marland Kitchen  EXFORGE 10-160 MG per tablet Take 1 tablet by mouth at bedtime.       . Insulin Aspart (NOVOLOG FLEXPEN [place]) Inject 3-6 Units into the skin 3 (three) times daily before meals. Takes 6 units in am and at night, if pt eats lunch she will take insulin before she eats      . insulin glargine (LANTUS) 100 units/mL SOLN Inject 7 Units into the skin 2 (two) times daily.      . metoprolol succinate (TOPROL-XL) 100 MG 24 hr tablet Take 100 mg by mouth daily. Take with or immediately following a meal.      . mycophenolate (CELLCEPT) 250 MG capsule Take 250 mg by mouth 2 (two) times daily.      . pantoprazole (PROTONIX) 40 MG tablet Take 40 mg by mouth daily.      . predniSONE (DELTASONE) 5 MG tablet Take 1 tablet by mouth daily.      . tacrolimus (PROGRAF) 1 MG capsule Take 1 mg by mouth 2 (two) times daily.      Marland Kitchen zolpidem  (AMBIEN) 10 MG tablet Take 10 mg by mouth at bedtime. For insomnia.      . [DISCONTINUED] NORVASC 10 MG tablet Take 1 tablet by mouth Daily.       No current facility-administered medications for this visit.    REVIEW OF SYSTEMS: Arly.Keller ] denotes positive finding; [  ] denotes negative finding  CARDIOVASCULAR:  [ ]  chest pain   [ ]  chest pressure   [ ]  palpitations   [ ]  orthopnea   [ ]  dyspnea on exertion   [ ]  claudication   [ ]  rest pain   [ ]  DVT   [ ]  phlebitis PULMONARY:   [ ]  productive cough   [ ]  asthma   [ ]  wheezing NEUROLOGIC:   [ ]  weakness  [ ]  paresthesias  [ ]  aphasia  [ ]  amaurosis  [ ]  dizziness HEMATOLOGIC:   [ ]  bleeding problems   [ ]  clotting disorders MUSCULOSKELETAL:  [ ]  joint pain   [ ]  joint swelling [ ]  leg swelling GASTROINTESTINAL: [ ]   blood in stool  [ ]   hematemesis GENITOURINARY:  [ ]   dysuria  [ ]   hematuria PSYCHIATRIC:  [ ]  history of major depression INTEGUMENTARY:  [ ]  rashes  [ ]  ulcers CONSTITUTIONAL:  [ ]  fever   [ ]  chills  PHYSICAL EXAM: Filed Vitals:   12/31/12 1703  BP: 165/71  Pulse: 94  Resp: 18  Height: 5\' 1"  (1.549 m)  Weight: 155 lb (70.308 kg)   Body mass index is 29.3 kg/(m^2). GENERAL: The patient is a well-nourished female, in no acute distress. The vital signs are documented above. CARDIOVASCULAR: There is a regular rate and rhythm.  PULMONARY: There is good air exchange bilaterally without wheezing or rales. ABDOMEN: Soft and non-tender with normal pitched bowel sounds.  MUSCULOSKELETAL: There are no major deformities or cyanosis. NEUROLOGIC: No focal weakness or paresthesias are detected. SKIN: There are no ulcers or rashes noted. PSYCHIATRIC: The patient has a normal affect. A right thigh AV graft has a good bruit and thrill. There is a 1-1/2 cm pulsatile mass over the proximal aspect of the arterial limb of her graft. There is no erythema. This area is tender.  MEDICAL ISSUES: Given that the small pseudoaneurysm in her  proximal right thigh AV graft appears to be enlarging and is also tender I have recommended that we explore this area  and repair this. This does not look infected on exam. We have discussed indications for surgery and the potential complications including the risk of bleeding problems or graft infection. She dialyzes Tuesdays Thursdays and Saturdays and her surgery is scheduled for Friday, 01/02/2013.  Abrar Koone S [facility] [place]: [phone]

## 2013-01-02 NOTE — Telephone Encounter (Signed)
-----   Message ----- From: Chuck Hint, MD Sent: 01/02/2013 10:55 AM To: Reuel Derby, Melene Plan, RN, * Subject: charge PROCEDURE: excision of lymphocele right thigh AV SURGEON: Di Kindle. Edilia Bo, MD, FACS ASSIST: Emilio Aspen RNFA Follow up prn. Thanks CD

## 2013-01-02 NOTE — Progress Notes (Signed)
Pts CBG 233, pt reports she is very sensitive to Novolog and would prefer not to have it treated at this time, Dr.Kasik notified regarding elevated sugar and pt's request, approves of no insulin at this time

## 2013-01-02 NOTE — Op Note (Signed)
NAME: [name]   MRN: [id number] DOB: 1971/03/26    DATE OF OPERATION: 01/02/2013  PREOP DIAGNOSIS: pseudoaneurysm right thigh AV graft  POSTOP DIAGNOSIS: lymphocele right thigh AV graft  PROCEDURE: excision of lymphocele right thigh AV  SURGEON: Di Kindle. Edilia Bo, MD, FACS  ASSIST: Emilio Aspen RNFA  ANESTHESIA: Gen.   EBL: minimal  INDICATIONS: [name] is a 42 y.o. female who dialyzes with a right thigh AV graft. She noted some swelling in the proximal graft on the arterial side which he felt was painful. He felt that this was enlarging. On exam it felt somewhat pulsatile and we suspected that she had a pseudoaneurysm repair.  FINDINGS: patient had a focal lymphocele over approximately 3 cm of the graft.  TECHNIQUE: Patient was taken to the operating room and received a general anesthetic. The right eye was prepped and draped in the usual sterile fashion. A longitudinal incision was made over the area of concern. There was a lymphocele on the anterior aspect of the graft in this area over a length of approximately 3 cm. This was sharply excised. There did not appear to be active seepage of lymphatic fluid from the graft. There was no pseudoaneurysm. Wounds closed the deep layer 3-0 Vicryl and the skin closed with 4-0 Vicryl. Dermabond was applied. The patient tolerated the procedure well and was transferred to the recovery room in stable condition. All needle and sponge counts were correct.  Waverly Ferrari, MD, FACS [facility]  DATE OF DICTATION:   01/02/2013

## 2013-01-02 NOTE — Interval H&P Note (Signed)
History and Physical Interval Note:  01/02/2013 9:46 AM  [name]  has presented today for surgery, with the diagnosis of Pseudoaneurysm of Right Thigh AVG End Stage Renal Disease  The various methods of treatment have been discussed with the patient and family. After consideration of risks, benefits and other options for treatment, the patient has consented to  Procedure(s): REPAIR OF PSEUDOANEURYSM OF RIGHT THIGH AVG (Right) as a surgical intervention .  The patient's history has been reviewed, patient examined, no change in status, stable for surgery.  I have reviewed the patient's chart and labs.  Questions were answered to the patient's satisfaction.     Blake Goya S

## 2013-01-02 NOTE — Preoperative (Signed)
Beta Blockers   Reason not to administer Beta Blockers:Not Applicable 

## 2013-01-04 ENCOUNTER — Emergency Department (HOSPITAL_COMMUNITY)
Admission: EM | Admit: 2013-01-04 | Discharge: 2013-01-05 | Disposition: A | Discharge status: Home / Self Care | Payer: Medicare Other | Attending: Emergency Medicine | Admitting: Emergency Medicine

## 2013-01-04 ENCOUNTER — Encounter (HOSPITAL_COMMUNITY): Payer: Self-pay | Admitting: *Deleted

## 2013-01-04 ENCOUNTER — Emergency Department (HOSPITAL_COMMUNITY): Payer: Medicare Other

## 2013-01-04 DIAGNOSIS — F3289 Other specified depressive episodes: Secondary | ICD-10-CM | POA: Insufficient documentation

## 2013-01-04 DIAGNOSIS — J45909 Unspecified asthma, uncomplicated: Secondary | ICD-10-CM | POA: Insufficient documentation

## 2013-01-04 DIAGNOSIS — Z794 Long term (current) use of insulin: Secondary | ICD-10-CM | POA: Insufficient documentation

## 2013-01-04 DIAGNOSIS — Z8669 Personal history of other diseases of the nervous system and sense organs: Secondary | ICD-10-CM | POA: Insufficient documentation

## 2013-01-04 DIAGNOSIS — IMO0002 Reserved for concepts with insufficient information to code with codable children: Secondary | ICD-10-CM | POA: Insufficient documentation

## 2013-01-04 DIAGNOSIS — E1069 Type 1 diabetes mellitus with other specified complication: Secondary | ICD-10-CM | POA: Insufficient documentation

## 2013-01-04 DIAGNOSIS — Z87828 Personal history of other (healed) physical injury and trauma: Secondary | ICD-10-CM | POA: Insufficient documentation

## 2013-01-04 DIAGNOSIS — E11319 Type 2 diabetes mellitus with unspecified diabetic retinopathy without macular edema: Secondary | ICD-10-CM | POA: Insufficient documentation

## 2013-01-04 DIAGNOSIS — Z8701 Personal history of pneumonia (recurrent): Secondary | ICD-10-CM | POA: Insufficient documentation

## 2013-01-04 DIAGNOSIS — Z862 Personal history of diseases of the blood and blood-forming organs and certain disorders involving the immune mechanism: Secondary | ICD-10-CM | POA: Insufficient documentation

## 2013-01-04 DIAGNOSIS — Z872 Personal history of diseases of the skin and subcutaneous tissue: Secondary | ICD-10-CM | POA: Insufficient documentation

## 2013-01-04 DIAGNOSIS — Z992 Dependence on renal dialysis: Secondary | ICD-10-CM | POA: Insufficient documentation

## 2013-01-04 DIAGNOSIS — I120 Hypertensive chronic kidney disease with stage 5 chronic kidney disease or end stage renal disease: Secondary | ICD-10-CM | POA: Insufficient documentation

## 2013-01-04 DIAGNOSIS — R5381 Other malaise: Secondary | ICD-10-CM | POA: Insufficient documentation

## 2013-01-04 DIAGNOSIS — G43909 Migraine, unspecified, not intractable, without status migrainosus: Secondary | ICD-10-CM | POA: Insufficient documentation

## 2013-01-04 DIAGNOSIS — J438 Other emphysema: Secondary | ICD-10-CM | POA: Insufficient documentation

## 2013-01-04 DIAGNOSIS — E669 Obesity, unspecified: Secondary | ICD-10-CM | POA: Insufficient documentation

## 2013-01-04 DIAGNOSIS — F329 Major depressive disorder, single episode, unspecified: Secondary | ICD-10-CM | POA: Insufficient documentation

## 2013-01-04 DIAGNOSIS — K219 Gastro-esophageal reflux disease without esophagitis: Secondary | ICD-10-CM | POA: Insufficient documentation

## 2013-01-04 DIAGNOSIS — E785 Hyperlipidemia, unspecified: Secondary | ICD-10-CM | POA: Insufficient documentation

## 2013-01-04 DIAGNOSIS — Z8679 Personal history of other diseases of the circulatory system: Secondary | ICD-10-CM | POA: Insufficient documentation

## 2013-01-04 DIAGNOSIS — E162 Hypoglycemia, unspecified: Secondary | ICD-10-CM

## 2013-01-04 DIAGNOSIS — N186 End stage renal disease: Secondary | ICD-10-CM | POA: Insufficient documentation

## 2013-01-04 DIAGNOSIS — Z79899 Other long term (current) drug therapy: Secondary | ICD-10-CM | POA: Insufficient documentation

## 2013-01-04 DIAGNOSIS — E1039 Type 1 diabetes mellitus with other diabetic ophthalmic complication: Secondary | ICD-10-CM | POA: Insufficient documentation

## 2013-01-04 DIAGNOSIS — Z880 Allergy status to penicillin: Secondary | ICD-10-CM | POA: Insufficient documentation

## 2013-01-04 DIAGNOSIS — Z8709 Personal history of other diseases of the respiratory system: Secondary | ICD-10-CM | POA: Insufficient documentation

## 2013-01-04 LAB — GLUCOSE, CAPILLARY
Glucose-Capillary: 202 mg/dL — ABNORMAL HIGH (ref 70–99)
Glucose-Capillary: 490 mg/dL — ABNORMAL HIGH (ref 70–99)

## 2013-01-04 LAB — POCT I-STAT, CHEM 8
Hemoglobin: 14.6 g/dL (ref 12.0–15.0)
Sodium: 131 mEq/L — ABNORMAL LOW (ref 135–145)
TCO2: 23 mmol/L (ref 0–100)

## 2013-01-04 MED ORDER — DIPHENHYDRAMINE HCL 25 MG PO CAPS
25.0000 mg | ORAL_CAPSULE | Freq: Once | ORAL | Status: AC
  Administered 2013-01-04: 25 mg via ORAL
  Filled 2013-01-04: qty 1

## 2013-01-04 MED ORDER — NOVOLOG FLEXPEN ~~LOC~~: 4.0000 [IU] | Freq: Two times a day (BID) | SUBCUTANEOUS | Status: DC

## 2013-01-04 MED ORDER — OXYCODONE-ACETAMINOPHEN 5-325 MG PO TABS
1.0000 | ORAL_TABLET | Freq: Once | ORAL | Status: AC
  Administered 2013-01-04: 1 via ORAL
  Filled 2013-01-04: qty 1

## 2013-01-04 MED ORDER — INSULIN GLARGINE 100 UNITS/ML SOLOSTAR PEN: 5.0000 [IU] | PEN_INJECTOR | Freq: Two times a day (BID) | SUBCUTANEOUS | Status: DC

## 2013-01-04 NOTE — ED Notes (Signed)
Pt found unresponsive by family. EMS called. When they arrived pt was unresponsive with food in her mouth. Glucose was 44 initially. EMS gave 1 mg of Glucagon IM. Glucose was 77 on recheck. Pt is a [employer] dialysis pt with access in rt thigh.

## 2013-01-04 NOTE — ED Provider Notes (Signed)
CSN: [id number]     Arrival date & time 01/04/13  2135 History     First MD Initiated Contact with Patient 01/04/13 2203     Chief Complaint  Patient presents with  . 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 Family reports that she has been tired all day, and this afternoon afternoon noted they were unable to weak throughout the call to EMS. EMS arrival patient's glucose was 40, and glucagon was administered.  The patient has headache of 2 weeks duration, reporting a history of migraines. Denies any other neurologic symptoms including no numbness, tingling, weakness, facial asymmetry, difficulty swallowing, vertigo, gait abnormalities. Denies fevers, chest pain, shortness of breath, cough.  Deny any fall or other ingestions today.  Past Medical History  Diagnosis Date  . Hyperlipidemia   . Alopecia   . Obesity   . Iron deficiency   . 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 RLS (restless legs syndrome)   . Respiratory failure   . Injury of conjunctiva and corneal abrasion of right eye without foreign body   . Cellulitis   . Dialysis patient     Tues; Thurs; Sat; [place]  . 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 Anemia   . Pneumonia 2012    "double"  . Blood transfusion 2004    "when I had my transplant"  . Migraine   . 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 Hyperparathyroidism, secondary   . Diabetic retinopathy   . Hypertension     takes Metoprolol and Exforge daily  . Depression     takes Klonopin nightly  . Diabetes mellitus type 1     Pt states diagnosed  at age 66 with prior episodes of DKA. S/p pancreatic transplant   . GERD (gastroesophageal reflux disease)     takes Protonix daily  . PONV (postoperative nausea and vomiting)   . Bronchitis   . Migraine   . Asthma   . Shortness of breath   . Emphysema of lung   . DKA (diabetic ketoacidoses) 07/02/2012  . Angina     none in past 2 years.   Past Surgical History  Procedure Laterality Date  . 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 Thyroglobulin      x 7  . Av fistula placement      right upper arm  . Cholecystectomy  1995  .  hd graft placement/removal      "had 2 in my left upper arm"  . Eye surgery    . Retinopathy surgery    . Tooth extraction  10/10/11    X's two  . Insertion of dialysis catheter  12/08/2011    Procedure: INSERTION OF DIALYSIS CATHETER;  Surgeon: Chuck Hint, MD;  Location: [facility] OR;  Service: Vascular;  Laterality: Left;  . Av fistula placement  01/22/2012    Procedure: INSERTION OF ARTERIOVENOUS (AV) GORE-TEX GRAFT ARM;  Surgeon: Chuck Hint, MD;  Location: [facility] OR;  Service: Vascular;  Laterality: Left;  Ultrasound guided.  . Av fistula placement  03/14/2012    Procedure: INSERTION OF ARTERIOVENOUS (AV) GORE-TEX GRAFT THIGH;  Surgeon: Chuck Hint, MD;  Location: [facility] OR;  Service: Vascular;  Laterality: Right;   Family History  Problem Relation Age of Onset  . Hypertension Mother   . Kidney disease Mother   . Diabetes Mother   . Hypertension Father   . Kidney disease Father   . Diabetes Father    History  Substance Use Topics  . Smoking status: Never Smoker   . Smokeless tobacco: Never Used  . Alcohol Use: No   OB History   Grav Para Term Preterm Abortions TAB SAB Ect Mult Living                 Review of Systems  Constitutional: Positive for fatigue. Negative for fever.  HENT: Negative for sore throat and neck pain.   Eyes: Negative for visual disturbance.  Respiratory: Negative  for cough and shortness of breath.   Cardiovascular: Negative for chest pain.  Gastrointestinal: Negative for nausea, vomiting and abdominal pain.  Genitourinary: Negative for difficulty urinating (does not produce urine).  Musculoskeletal: Negative for back pain.  Skin: Negative for rash.  Neurological: Positive for weakness (generalized). Negative for syncope and headaches.    Allergies  Morphine and related; Penicillins; Tramadol; Depakote; and Vicodin  Home Medications   Current Outpatient Rx  Name  Route  Sig  Dispense  Refill  . albuterol (PROVENTIL HFA;VENTOLIN HFA) 108 (90 BASE) MCG/ACT inhaler   Inhalation   Inhale 2 puffs into the lungs every 4 (four) hours as needed. For wheezing.          Marland Kitchen aspirin EC 81 MG tablet   Oral   Take 81 mg by mouth daily.         . calcium acetate (PHOSLO) 667 MG capsule   Oral   Take 2,001-2,668 mg by mouth 3 (three) times daily with meals. Dosage depends on meal consumed         . clonazePAM (KLONOPIN) 0.5 MG tablet   Oral   Take 1 mg by mouth at bedtime.          Marland Kitchen EXFORGE 10-160 MG per tablet   Oral   Take 1 tablet by mouth at bedtime.          . famotidine (PEPCID) 20 MG tablet   Oral   Take 20 mg by mouth at bedtime.         . metoprolol succinate (TOPROL-XL) 100 MG 24 hr tablet   Oral   Take 100 mg by mouth daily. Take with or immediately following a meal.         . mycophenolate (CELLCEPT) 250 MG capsule   Oral   Take 250 mg by mouth 2 (two) times daily.         . pantoprazole (PROTONIX) 40 MG tablet   Oral   Take 40 mg by mouth daily.         . predniSONE (DELTASONE) 5 MG tablet   Oral   Take 5 mg by mouth daily.          . tacrolimus (PROGRAF) 1 MG capsule   Oral   Take 1 mg by mouth 2 (two) times daily.         Marland Kitchen zolpidem (AMBIEN) 10 MG tablet   Oral   Take 10 mg by mouth at bedtime. For insomnia.         . Insulin Aspart (NOVOLOG FLEXPEN [place])   Subcutaneous   Inject 4 Units  into the skin 2 (two)  times daily. Takes 4 units in am and at night, if pt eats lunch she will take insulin before she eats         . insulin glargine (LANTUS) 100 units/mL SOLN   Subcutaneous   Inject 5 Units into the skin 2 (two) times daily.          BP 92/60  Pulse 71  Temp(Src) 97.9 F (36.6 C) (Oral)  Resp 12  Ht 5\' 1"  (1.549 m)  Wt 145 lb (65.772 kg)  BMI 27.41 kg/m2  SpO2 97%  LMP 01/02/2013 Physical Exam  Nursing note and vitals reviewed. Constitutional: She is oriented to person, place, and time. She appears well-developed and well-nourished. No distress.  HENT:  Head: Normocephalic and atraumatic.  Mouth/Throat: No oropharyngeal exudate.  Eyes: Conjunctivae and EOM are normal. Pupils are equal, round, and reactive to light.  Dysconjugate gaze baseline per family   Neck: Normal range of motion.  Cardiovascular: Normal rate, regular rhythm, normal heart sounds and intact distal pulses.  Exam reveals no gallop and no friction rub.   No murmur heard. Pulmonary/Chest: Effort normal and breath sounds normal. No respiratory distress. She has no wheezes. She has no rales.  Abdominal: Soft. She exhibits no distension. There is no tenderness. There is no guarding.  Musculoskeletal: She exhibits no edema and no tenderness.  Neurological: She is alert and oriented to person, place, and time.  Skin: Skin is warm and dry. No rash noted. She is not diaphoretic. No erythema.  Right groin fistula site without surrounding erythema    ED Course   Procedures (including critical care time)  Labs Reviewed  GLUCOSE, CAPILLARY - Abnormal; Notable for the following:    Glucose-Capillary 202 (*)    All other components within normal limits  GLUCOSE, CAPILLARY - Abnormal; Notable for the following:    Glucose-Capillary 490 (*)    All other components within normal limits  POCT I-STAT, CHEM 8 - Abnormal; Notable for the following:    Sodium 131 (*)    Creatinine, Ser 7.20 (*)     Glucose, Bld 442 (*)    All other components within normal limits   Ct Head Wo Contrast  01/04/2013   *RADIOLOGY REPORT*  Clinical Data: Hypoglycemia.  CT HEAD WITHOUT CONTRAST  Technique:  Contiguous axial images were obtained from the base of the skull through the vertex without contrast.  Comparison: Head CT 08/28/2011  Findings: Physiologic calcifications in the basal ganglia bilaterally.  Well defined foci of low attenuation in the left globus pallidus and head of the right caudate nucleus compatible with old lacunar infarctions.  Patchy areas of decreased attenuation throughout the deep and periventricular white matter of the cerebral hemispheres bilaterally, suggest chronic microvascular ischemic disease. No acute intracranial abnormalities. Specifically, no evidence of acute intracranial hemorrhage, no definite findings of acute/subacute cerebral ischemia, no mass, mass effect, hydrocephalus or abnormal intra or extra-axial fluid collections.  Visualized paranasal sinuses and mastoids are well pneumatized.  No acute displaced skull fractures are identified.  IMPRESSION: 1.  No acute intracranial abnormalities. 2.  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   Original Report Authenticated By: Trudie Reed, M.D.   1. Hypoglycemia     MDM  42 year old female with history of ESRD T-Th-Sat status post failed pancreatic and kidney transplant on immunosuppressants, hyperlipidemia, hypertension, and diabetes type 1 presents via EMS for her lethargy and hypoglycemia.  Patient  brittle diabetic with hypoglycemia to 40 with EMS with change to 202 and 400s after glucagon and eating.  Patient sleepy however easily arousable on exam.  Patient concerned of HA and given history of sleepiness today and headache in patient with history of dialysis CT head ordered which showed no acute intracranial abnormalities,  and early changes of chronic microvascular ischemic disease. I-STAT chem 8 done to evaluate for electrolyte abnormalities or anemia as cause of patient's depressed mental status at home and showed a sodium of 131 which is near where it has been previously, and a normal hemoglobin.  Bicarbonate within normal limits.  Patient afebrile and have low suspicion for meningitis, or other infectious etiology of patient's symptoms. Patient with recent right groin surgery over the fistula site, with no signs of surrounding infection or cellulitis and well-healing incision. Patient does have symptoms consistent with pneumonia. 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 Patient giving Vicodin and Benadryl (given pruritis with vicodin) for headache with improvement in symptoms.  Patient with normal blood pressures on arrival to the emergency department, and after receiving these medications was noted to have mild hypotension and past notes indicate that patient's blood pressures are occasionally in the 100 systolic.  Blood pressure change likely secondary to medications.  Patient easily arousable and ambulates with steady gait.  Recommend close follow up with PCP tomorrow and dialysis on Tuesday.     Rhae Lerner, MD 01/05/13 [phone]

## 2013-01-04 NOTE — ED Notes (Signed)
Pt sitting up in bed. Attempting to eat a sandwich and drink some orange juice.

## 2013-01-04 NOTE — ED Notes (Signed)
Pt sleepy, lethargic, cbg checked.

## 2013-01-04 NOTE — ED Provider Notes (Signed)
Patient was noted to be sleepy all day today by her daughters. EMS was called. Patient noted by EMS to have CBG of 45. She was administered glucagon 1 mg IM. Blood glucose came up to possibly 70. She presently complains of a headache typical migraine she's had for years. She's had no recent changes in her insulin regimen. She denies fever. Denies cough. She does complain of pain at right thigh it site of recent revision of dialysis graft. On exam patient is sleepy arousable to verbal stimulus. Answers properly. Glasgow Coma Score 14. Lungs clear auscultation heart regular rate and rhythm abdomen nondistended nontender, obese. Right lower extremity there is a dialysis graft at the proximal thigh. Which is mildly tender. Good thrill. No redness or warmth. All extremities no redness swelling or tenderness neurovascularly intact. Neurologically moves all extremities. Cranial nerves II through XII grossly intact. Answers properly.  11:45 PM patient is ambulatory Glasgow Coma Score 15. No distress. Patient admits being a brittle diabetic. We will decrease her insulin dose slightly. She is instructed to followup with her primary care physician today who regulates her insulin.  Doug Sou, MD 01/05/13 0003

## 2013-01-04 NOTE — ED Notes (Signed)
EDP Dr. Ethelda Chick at [facility], pt remains sleepy, lethargic, interactive, answering questions.

## 2013-01-05 ENCOUNTER — Encounter (HOSPITAL_COMMUNITY): Payer: Self-pay | Admitting: Vascular Surgery

## 2013-01-05 MED ORDER — SODIUM CHLORIDE 0.9 % IV BOLUS (SEPSIS): 250.0000 mL | Freq: Once | INTRAVENOUS | Status: DC

## 2013-01-05 NOTE — ED Notes (Signed)
Pt ride called again x2, no answer.

## 2013-01-05 NOTE — ED Notes (Signed)
Pt sleepy, arousable to voice, denies sx or complaints, ready to go, OK with plan, answering questions appropriately, ride called, message left. Waiting for transport.

## 2013-01-06 NOTE — ED Provider Notes (Signed)
I have personally seen and examined the patient.  I have discussed the plan of care with the resident.  I have reviewed the documentation on PMH/FH/Soc. History.  I have reviewed the documentation of the resident and agree.  Doug Sou, MD 01/06/13 [phone]

## 2013-01-12 ENCOUNTER — Encounter (HOSPITAL_COMMUNITY): Payer: Self-pay | Admitting: Emergency Medicine

## 2013-01-12 ENCOUNTER — Inpatient Hospital Stay (HOSPITAL_COMMUNITY)
Admission: EM | Admit: 2013-01-12 | Discharge: 2013-01-17 | DRG: 917 | Disposition: A | Discharge status: Home / Self Care | Payer: Medicare Other | Attending: Internal Medicine | Admitting: Internal Medicine

## 2013-01-12 DIAGNOSIS — E785 Hyperlipidemia, unspecified: Secondary | ICD-10-CM | POA: Diagnosis present

## 2013-01-12 DIAGNOSIS — D631 Anemia in chronic kidney disease: Secondary | ICD-10-CM

## 2013-01-12 DIAGNOSIS — G43909 Migraine, unspecified, not intractable, without status migrainosus: Secondary | ICD-10-CM | POA: Diagnosis present

## 2013-01-12 DIAGNOSIS — T383X1A Poisoning by insulin and oral hypoglycemic [antidiabetic] drugs, accidental (unintentional), initial encounter: Principal | ICD-10-CM | POA: Diagnosis present

## 2013-01-12 DIAGNOSIS — L659 Nonscarring hair loss, unspecified: Secondary | ICD-10-CM | POA: Diagnosis present

## 2013-01-12 DIAGNOSIS — R05 Cough: Secondary | ICD-10-CM

## 2013-01-12 DIAGNOSIS — T82898A Other specified complication of vascular prosthetic devices, implants and grafts, initial encounter: Secondary | ICD-10-CM

## 2013-01-12 DIAGNOSIS — G2581 Restless legs syndrome: Secondary | ICD-10-CM | POA: Diagnosis present

## 2013-01-12 DIAGNOSIS — T68XXXA Hypothermia, initial encounter: Secondary | ICD-10-CM

## 2013-01-12 DIAGNOSIS — E11319 Type 2 diabetes mellitus with unspecified diabetic retinopathy without macular edema: Secondary | ICD-10-CM | POA: Diagnosis present

## 2013-01-12 DIAGNOSIS — R680 Hypothermia, not associated with low environmental temperature: Secondary | ICD-10-CM | POA: Diagnosis present

## 2013-01-12 DIAGNOSIS — R509 Fever, unspecified: Secondary | ICD-10-CM | POA: Diagnosis not present

## 2013-01-12 DIAGNOSIS — I120 Hypertensive chronic kidney disease with stage 5 chronic kidney disease or end stage renal disease: Secondary | ICD-10-CM | POA: Diagnosis present

## 2013-01-12 DIAGNOSIS — Z940 Kidney transplant status: Secondary | ICD-10-CM

## 2013-01-12 DIAGNOSIS — K219 Gastro-esophageal reflux disease without esophagitis: Secondary | ICD-10-CM | POA: Diagnosis present

## 2013-01-12 DIAGNOSIS — N189 Chronic kidney disease, unspecified: Secondary | ICD-10-CM

## 2013-01-12 DIAGNOSIS — Z992 Dependence on renal dialysis: Secondary | ICD-10-CM

## 2013-01-12 DIAGNOSIS — Z7982 Long term (current) use of aspirin: Secondary | ICD-10-CM

## 2013-01-12 DIAGNOSIS — Z9483 Pancreas transplant status: Secondary | ICD-10-CM

## 2013-01-12 DIAGNOSIS — F3289 Other specified depressive episodes: Secondary | ICD-10-CM | POA: Diagnosis present

## 2013-01-12 DIAGNOSIS — Z0000 Encounter for general adult medical examination without abnormal findings: Secondary | ICD-10-CM

## 2013-01-12 DIAGNOSIS — F329 Major depressive disorder, single episode, unspecified: Secondary | ICD-10-CM | POA: Diagnosis present

## 2013-01-12 DIAGNOSIS — M79609 Pain in unspecified limb: Secondary | ICD-10-CM

## 2013-01-12 DIAGNOSIS — IMO0002 Reserved for concepts with insufficient information to code with codable children: Secondary | ICD-10-CM

## 2013-01-12 DIAGNOSIS — L819 Disorder of pigmentation, unspecified: Secondary | ICD-10-CM

## 2013-01-12 DIAGNOSIS — N058 Unspecified nephritic syndrome with other morphologic changes: Secondary | ICD-10-CM

## 2013-01-12 DIAGNOSIS — E870 Hyperosmolality and hypernatremia: Secondary | ICD-10-CM

## 2013-01-12 DIAGNOSIS — E875 Hyperkalemia: Secondary | ICD-10-CM

## 2013-01-12 DIAGNOSIS — M79643 Pain in unspecified hand: Secondary | ICD-10-CM

## 2013-01-12 DIAGNOSIS — N186 End stage renal disease: Secondary | ICD-10-CM | POA: Diagnosis present

## 2013-01-12 DIAGNOSIS — Z794 Long term (current) use of insulin: Secondary | ICD-10-CM

## 2013-01-12 DIAGNOSIS — N2581 Secondary hyperparathyroidism of renal origin: Secondary | ICD-10-CM | POA: Diagnosis present

## 2013-01-12 DIAGNOSIS — E162 Hypoglycemia, unspecified: Secondary | ICD-10-CM

## 2013-01-12 DIAGNOSIS — Z79899 Other long term (current) drug therapy: Secondary | ICD-10-CM

## 2013-01-12 DIAGNOSIS — E1110 Type 2 diabetes mellitus with ketoacidosis without coma: Secondary | ICD-10-CM

## 2013-01-12 DIAGNOSIS — E1069 Type 1 diabetes mellitus with other specified complication: Secondary | ICD-10-CM | POA: Diagnosis present

## 2013-01-12 DIAGNOSIS — J189 Pneumonia, unspecified organism: Secondary | ICD-10-CM | POA: Diagnosis not present

## 2013-01-12 DIAGNOSIS — T38801A Poisoning by unspecified hormones and synthetic substitutes, accidental (unintentional), initial encounter: Secondary | ICD-10-CM | POA: Diagnosis present

## 2013-01-12 DIAGNOSIS — E1029 Type 1 diabetes mellitus with other diabetic kidney complication: Secondary | ICD-10-CM

## 2013-01-12 DIAGNOSIS — E669 Obesity, unspecified: Secondary | ICD-10-CM | POA: Diagnosis present

## 2013-01-12 DIAGNOSIS — R209 Unspecified disturbances of skin sensation: Secondary | ICD-10-CM

## 2013-01-12 LAB — BASIC METABOLIC PANEL
CO2: 24 mEq/L (ref 19–32)
Calcium: 10.3 mg/dL (ref 8.4–10.5)
Creatinine, Ser: 8.84 mg/dL — ABNORMAL HIGH (ref 0.50–1.10)
GFR calc Af Amer: 6 mL/min — ABNORMAL LOW (ref >90)

## 2013-01-12 LAB — CBC WITH DIFFERENTIAL/PLATELET
Basophils Absolute: 0 10*3/uL (ref 0.0–0.1)
Basophils Relative: 0 % (ref 0–1)
Eosinophils Relative: 5 % (ref 0–5)
Lymphocytes Relative: 21 % (ref 12–46)
MCV: 95.4 fL (ref 78.0–100.0)
Platelets: 151 10*3/uL (ref 150–400)
RDW: 14 % (ref 11.5–15.5)
WBC: 6.1 10*3/uL (ref 4.0–10.5)

## 2013-01-12 LAB — POCT I-STAT, CHEM 8
BUN: 42 mg/dL — ABNORMAL HIGH (ref 6–23)
Calcium, Ion: 1.3 mmol/L — ABNORMAL HIGH (ref 1.12–1.23)
Chloride: 99 mEq/L (ref 96–112)
Glucose, Bld: 182 mg/dL — ABNORMAL HIGH (ref 70–99)
TCO2: 26 mmol/L (ref 0–100)

## 2013-01-12 LAB — GLUCOSE, CAPILLARY: Glucose-Capillary: 32 mg/dL — CL (ref 70–99)

## 2013-01-12 MED ORDER — HEPARIN SODIUM (PORCINE) 5000 UNIT/ML IJ SOLN
5000.0000 [IU] | Freq: Three times a day (TID) | INTRAMUSCULAR | Status: DC
  Administered 2013-01-16: 5000 [IU] via SUBCUTANEOUS
  Filled 2013-01-12 (×17): qty 1

## 2013-01-12 MED ORDER — CALCIUM ACETATE 667 MG PO CAPS
2001.0000 mg | ORAL_CAPSULE | Freq: Three times a day (TID) | ORAL | Status: DC
  Administered 2013-01-13 (×2): 2668 mg via ORAL
  Filled 2013-01-12 (×7): qty 4

## 2013-01-12 MED ORDER — FAMOTIDINE 20 MG PO TABS
20.0000 mg | ORAL_TABLET | Freq: Every day | ORAL | Status: DC
  Administered 2013-01-12 – 2013-01-16 (×5): 20 mg via ORAL
  Filled 2013-01-12 (×6): qty 1

## 2013-01-12 MED ORDER — OXYCODONE-ACETAMINOPHEN 5-325 MG PO TABS
1.0000 | ORAL_TABLET | Freq: Once | ORAL | Status: AC
  Administered 2013-01-12: 1 via ORAL
  Filled 2013-01-12: qty 1

## 2013-01-12 MED ORDER — SODIUM CHLORIDE 0.9 % IV SOLN
25.0000 mg | Freq: Three times a day (TID) | INTRAVENOUS | Status: DC | PRN
  Administered 2013-01-12 – 2013-01-14 (×3): 25 mg via INTRAVENOUS
  Filled 2013-01-12 (×4): qty 1

## 2013-01-12 MED ORDER — IRBESARTAN 150 MG PO TABS
150.0000 mg | ORAL_TABLET | Freq: Every day | ORAL | Status: DC
  Administered 2013-01-13 – 2013-01-16 (×3): 150 mg via ORAL
  Filled 2013-01-12 (×6): qty 1

## 2013-01-12 MED ORDER — TACROLIMUS 1 MG PO CAPS
1.0000 mg | ORAL_CAPSULE | Freq: Two times a day (BID) | ORAL | Status: DC
  Administered 2013-01-12 – 2013-01-17 (×10): 1 mg via ORAL
  Filled 2013-01-12 (×12): qty 1

## 2013-01-12 MED ORDER — SODIUM CHLORIDE 0.9 % IJ SOLN
3.0000 mL | Freq: Two times a day (BID) | INTRAMUSCULAR | Status: DC
  Administered 2013-01-13 – 2013-01-17 (×7): 3 mL via INTRAVENOUS

## 2013-01-12 MED ORDER — DEXTROSE 50 % IV SOLN: 50.0000 mL | Freq: Once | INTRAVENOUS | Status: AC | PRN

## 2013-01-12 MED ORDER — PANTOPRAZOLE SODIUM 40 MG PO TBEC
40.0000 mg | DELAYED_RELEASE_TABLET | Freq: Every day | ORAL | Status: DC
  Administered 2013-01-12 – 2013-01-17 (×6): 40 mg via ORAL
  Filled 2013-01-12 (×6): qty 1

## 2013-01-12 MED ORDER — DEXTROSE 50 % IV SOLN
1.0000 | Freq: Once | INTRAVENOUS | Status: AC
  Administered 2013-01-12: 50 mL via INTRAVENOUS
  Filled 2013-01-12: qty 50

## 2013-01-12 MED ORDER — CAMPHOR-MENTHOL 0.5-0.5 % EX LOTN
TOPICAL_LOTION | CUTANEOUS | Status: DC | PRN
  Filled 2013-01-12: qty 222

## 2013-01-12 MED ORDER — ACETAMINOPHEN 325 MG PO TABS
650.0000 mg | ORAL_TABLET | Freq: Four times a day (QID) | ORAL | Status: DC | PRN
  Administered 2013-01-13 (×2): 650 mg via ORAL
  Filled 2013-01-12 (×3): qty 2

## 2013-01-12 MED ORDER — RENA-VITE PO TABS
1.0000 | ORAL_TABLET | Freq: Every day | ORAL | Status: DC
  Administered 2013-01-12 – 2013-01-16 (×5): 1 via ORAL
  Filled 2013-01-12 (×6): qty 1

## 2013-01-12 MED ORDER — ACETAMINOPHEN 650 MG RE SUPP: 650.0000 mg | Freq: Four times a day (QID) | RECTAL | Status: DC | PRN

## 2013-01-12 MED ORDER — ASPIRIN EC 81 MG PO TBEC
81.0000 mg | DELAYED_RELEASE_TABLET | Freq: Every day | ORAL | Status: DC
  Administered 2013-01-13 – 2013-01-17 (×5): 81 mg via ORAL
  Filled 2013-01-12 (×5): qty 1

## 2013-01-12 MED ORDER — ONDANSETRON HCL 4 MG/2ML IJ SOLN
4.0000 mg | Freq: Four times a day (QID) | INTRAMUSCULAR | Status: DC | PRN
  Administered 2013-01-13 – 2013-01-17 (×7): 4 mg via INTRAVENOUS
  Filled 2013-01-12 (×4): qty 2

## 2013-01-12 MED ORDER — DEXTROSE 10 % IV SOLN
INTRAVENOUS | Status: DC
  Administered 2013-01-12: 19:00:00 via INTRAVENOUS
  Administered 2013-01-13: 75 mL/h via INTRAVENOUS

## 2013-01-12 MED ORDER — AMLODIPINE BESYLATE 10 MG PO TABS
10.0000 mg | ORAL_TABLET | Freq: Every day | ORAL | Status: DC
  Administered 2013-01-13 – 2013-01-16 (×3): 10 mg via ORAL
  Filled 2013-01-12 (×6): qty 1

## 2013-01-12 MED ORDER — ONDANSETRON HCL 4 MG PO TABS: 4.0000 mg | ORAL_TABLET | Freq: Four times a day (QID) | ORAL | Status: DC | PRN

## 2013-01-12 MED ORDER — ALBUTEROL SULFATE HFA 108 (90 BASE) MCG/ACT IN AERS: 2.0000 | INHALATION_SPRAY | RESPIRATORY_TRACT | Status: DC | PRN

## 2013-01-12 MED ORDER — DEXTROSE-NACL 5-0.9 % IV SOLN
Freq: Once | INTRAVENOUS | Status: AC
  Administered 2013-01-12: 17:00:00 via INTRAVENOUS

## 2013-01-12 MED ORDER — IPRATROPIUM BROMIDE 0.03 % NA SOLN: 2.0000 | Freq: Two times a day (BID) | NASAL | Status: DC

## 2013-01-12 MED ORDER — AMLODIPINE BESYLATE-VALSARTAN 10-160 MG PO TABS: 1.0000 | ORAL_TABLET | Freq: Every day | ORAL | Status: DC

## 2013-01-12 MED ORDER — PREDNISONE 5 MG PO TABS
5.0000 mg | ORAL_TABLET | Freq: Every day | ORAL | Status: DC
  Administered 2013-01-12 – 2013-01-17 (×4): 5 mg via ORAL
  Filled 2013-01-12 (×6): qty 1

## 2013-01-12 MED ORDER — METOPROLOL SUCCINATE ER 100 MG PO TB24
100.0000 mg | ORAL_TABLET | Freq: Every day | ORAL | Status: DC
  Filled 2013-01-12 (×5): qty 1

## 2013-01-12 MED ORDER — DEXTROSE 50 % IV SOLN: 25.0000 mL | Freq: Once | INTRAVENOUS | Status: AC | PRN

## 2013-01-12 MED ORDER — DIPHENHYDRAMINE HCL 25 MG PO CAPS
25.0000 mg | ORAL_CAPSULE | Freq: Four times a day (QID) | ORAL | Status: DC | PRN
  Administered 2013-01-12 – 2013-01-17 (×3): 50 mg via ORAL
  Filled 2013-01-12: qty 2
  Filled 2013-01-12: qty 1
  Filled 2013-01-12: qty 2

## 2013-01-12 MED ORDER — MYCOPHENOLATE MOFETIL 250 MG PO CAPS
250.0000 mg | ORAL_CAPSULE | Freq: Two times a day (BID) | ORAL | Status: DC
  Administered 2013-01-12 – 2013-01-17 (×9): 250 mg via ORAL
  Filled 2013-01-12 (×11): qty 1

## 2013-01-12 NOTE — ED Notes (Signed)
Pt given orange juice.

## 2013-01-12 NOTE — ED Notes (Signed)
Per EMS: Pt found unresponsive, BG 19. Per family, pt took 15 Lantus and 15 Novolog at 1315 without checking BG. Pt given glucagon.  BG increased to 44. AO x4. GCS 15. 128/68. 68 SR. 18 RR. 99% 2L.

## 2013-01-12 NOTE — ED Notes (Signed)
Checked patient blood sugar it was 176 notified RN Natalie of blood sugar

## 2013-01-12 NOTE — H&P (Addendum)
[facility] History and Physical  [name] ZOX:[id number] DOB: Mar 11, 1971 DOA: 01/12/2013  Referring physician:  PCP:  [name], MD   Chief Complaint:  hypoglycemia  HPI:  The patient is a 42 y.o. year-old female with history of ESRD T,H,Sa, pseudoaneurysm of the right thigh AVG, T1DM, HTN, HLD who presents with hypoglycemia.  The patient was last at their baseline health yesterday.  This morning, she states she gave herself her insulin.  She is supposed to take 7 units of lantus and 6 units of aspart.  She does not always check her fingersticks before she administers her insulin.  She was very hungry today during the day and then became somewhat sleepy.  Her family thought she had not given herself her insulin and asked her how much insulin she was supposed to get and she answered "15 units," however, she was confused by her hypoglycemia.  They gave her an additional 15 units of lantus and 15 units of aspart. She subsequently became unresponsive and so the family called EMS.  Her glucose was 19 when EMS arrived.  She was given glucagon and FS rose to 44. Since being in the ER, she has received 2 amps of D50 (50mL each) and her fingersticks have been 32 > 176 > 75 > 57 > 111.  She was initially unresponsive, but has awoken.  Her slurred speech, confusion are improving.  She was initially hypothermic, however, her body temperature is rising slowly with warm blankets.  She is being admitted to the stepdown unit for q1h fingersticks overnight and bearhugger if needed.   Review of Systems:  Denies fevers, chills, weight loss or gain, changes to hearing and vision.  Denies rhinorrhea, sinus congestion, sore throat.  Denies chest pain and palpitations.  Denies SOB, wheezing, cough.  Denies nausea, vomiting, constipation, diarrhea.  Denies dysuria, frequency, urgency, polyuria, polydipsia.  Denies hematemesis, blood in stools, melena, abnormal bruising or bleeding.  Denies  lymphadenopathy. Chronic myalgias. States AVG has been working well for HD.  Denies lower extremity edema.  Denies focal numbness, weakness.  Denies anxiety and depression.    Past Medical History  Diagnosis Date  . Hyperlipidemia   . Alopecia   . Obesity   . Iron deficiency   . 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 RLS (restless legs syndrome)   . Respiratory failure   . Injury of conjunctiva and corneal abrasion of right eye without foreign body   . Cellulitis   . Dialysis patient     Tues; Thurs; Sat; [place]  . 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 Anemia   . Pneumonia 2012    "double"  . Blood transfusion 2004    "when I had my transplant"  . Migraine   . 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 Hyperparathyroidism, secondary   . Diabetic retinopathy   . Hypertension     takes Metoprolol and Exforge daily  . Depression     takes Klonopin nightly  . Diabetes mellitus type 1     Pt states diagnosed at age 23 with prior episodes of DKA. S/p pancreatic transplant   . GERD (gastroesophageal reflux disease)     takes Protonix daily  . PONV (postoperative nausea and vomiting)   . Bronchitis   . Migraine   . Asthma   . Shortness  of breath   . Emphysema of lung   . DKA (diabetic ketoacidoses) 07/02/2012  . Angina     none in past 2 years.   Past Surgical History  Procedure Laterality Date  . 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 Thyroglobulin      x 7  . Av fistula placement      right upper arm  . Cholecystectomy  1995  .  hd graft placement/removal      "had 2 in my left upper arm"  . Eye surgery    . Retinopathy surgery    . Tooth extraction  10/10/11    X's two  . Insertion of dialysis catheter  12/08/2011    Procedure: INSERTION OF DIALYSIS CATHETER;  Surgeon: Chuck Hint, MD;   Location: [facility] OR;  Service: Vascular;  Laterality: Left;  . Av fistula placement  01/22/2012    Procedure: INSERTION OF ARTERIOVENOUS (AV) GORE-TEX GRAFT ARM;  Surgeon: Chuck Hint, MD;  Location: [facility] OR;  Service: Vascular;  Laterality: Left;  Ultrasound guided.  . Av fistula placement  03/14/2012    Procedure: INSERTION OF ARTERIOVENOUS (AV) GORE-TEX GRAFT THIGH;  Surgeon: Chuck Hint, MD;  Location: [facility] OR;  Service: Vascular;  Laterality: Right;  . False aneurysm repair Right 01/02/2013    Procedure: Excision of lymphocele in right thigh.;  Surgeon: Chuck Hint, MD;  Location: [facility] OR;  Service: Vascular;  Laterality: Right;   Social History:  reports that she has never smoked. She has never used smokeless tobacco. She reports that she does not drink alcohol or use illicit drugs.  Lives at home with fiance, has a 17yo daughter. Ambulatory without cane or walker. Never smoker, no drugs or alcohol. Lost mom last year, has no brothers or sisters.   Allergies  Allergen Reactions  . Morphine And Related Nausea And Vomiting and Other (See Comments)    "makes me delerirous"  . Penicillins Anaphylaxis  . Tramadol Nausea And Vomiting  . Depakote [Divalproex Sodium] Other (See Comments)    Pt gets "delirious"  . Vicodin [Hydrocodone-Acetaminophen] Rash    Family History  Problem Relation Age of Onset  . Hypertension Mother   . Kidney disease Mother   . Diabetes Mother   . Hypertension Father   . Kidney disease Father   . Diabetes Father      Prior to Admission medications   Medication Sig Start Date End Date Taking? Authorizing Provider  aspirin EC 81 MG tablet Take 81 mg by mouth daily.   Yes Historical Provider, MD  calcium acetate (PHOSLO) 667 MG capsule Take 2,001-2,668 mg by mouth 3 (three) times daily with meals. Dosage depends on meal consumed 03/12/11  Yes Historical Provider, MD  clonazePAM (KLONOPIN) 0.5 MG tablet Take 1 mg by mouth at bedtime.    Yes  Historical Provider, MD  EXFORGE 10-160 MG per tablet Take 1 tablet by mouth at bedtime.  06/15/12  Yes Historical Provider, MD  famotidine (PEPCID) 20 MG tablet Take 20 mg by mouth at bedtime.   Yes Historical Provider, MD  Insulin Aspart (NOVOLOG FLEXPEN [place]) Inject 4 Units into the skin 2 (two) times daily. Takes 4 units in am and at night, if pt eats lunch she will take insulin before she eats 01/04/13  Yes Rhae Lerner, MD  insulin glargine (LANTUS) 100 units/mL SOLN Inject 5 Units into the skin 2 (two) times daily. 01/04/13  Yes Denny Peon  Arman Bogus, MD  metoprolol succinate (TOPROL-XL) 100 MG 24 hr tablet Take 100 mg by mouth daily. Take with or immediately following a meal.   Yes Historical Provider, MD  mycophenolate (CELLCEPT) 250 MG capsule Take 250 mg by mouth 2 (two) times daily.   Yes Historical Provider, MD  pantoprazole (PROTONIX) 40 MG tablet Take 40 mg by mouth daily.   Yes Historical Provider, MD  predniSONE (DELTASONE) 5 MG tablet Take 5 mg by mouth daily.  01/21/11  Yes Historical Provider, MD  tacrolimus (PROGRAF) 1 MG capsule Take 1 mg by mouth 2 (two) times daily.   Yes Historical Provider, MD  zolpidem (AMBIEN) 10 MG tablet Take 10 mg by mouth at bedtime. For insomnia.   Yes Historical Provider, MD  albuterol (PROVENTIL HFA;VENTOLIN HFA) 108 (90 BASE) MCG/ACT inhaler Inhale 2 puffs into the lungs every 4 (four) hours as needed. For wheezing.  04/23/11   Nyoka Cowden, MD   Physical Exam: Filed Vitals:   01/12/13 1730 01/12/13 1730 01/12/13 1800 01/12/13 1812  BP: 124/72  140/89 140/89  Pulse:    70  Temp:  97.6 F (36.4 C)    TempSrc:  Oral    Resp: 11  13 16   SpO2:         General:  AAF, no acute distress, sleepy but easily arouseable  Eyes:  PERRL, anicteric, non-injected, mild exotropia of the left eye  ENT:  Nares clear.  OP clear, non-erythematous without plaques or exudates.  MMM.  Neck:  Supple without TM or JVD.    Lymph:  No cervical,  supraclavicular, or submandibular LAD.  Cardiovascular:  RRR, normal S1, S2, without m/r/g.  2+ pulses, warm extremities  Respiratory:  CTA bilaterally without increased WOB.  Abdomen:  NABS.  Soft, ND/NT.    Skin:  Scars on upper arms from previous surgeries.  Palpable thrill/audible bruit right thigh.  No overlying erythema or induration  Musculoskeletal:  Normal bulk and tone.  No LE edema.  Psychiatric: O x 4.  Appropriate affect when awake  Neurologic:  CN 3-12 intact.  5/5 strength.  Sensation intact.  Labs on Admission:  Basic Metabolic Panel:  Recent Labs Lab 01/12/13 1510 01/12/13 1538  NA 131* 133*  K 3.9 3.9  CL 94* 99  CO2 24  --   GLUCOSE 183* 182*  BUN 39* 42*  CREATININE 8.84* 9.10*  CALCIUM 10.3  --    Liver Function Tests: No results found for this basename: AST, ALT, ALKPHOS, BILITOT, PROT, ALBUMIN,  in the last 168 hours No results found for this basename: LIPASE, AMYLASE,  in the last 168 hours No results found for this basename: AMMONIA,  in the last 168 hours CBC:  Recent Labs Lab 01/12/13 1510 01/12/13 1538  WBC 6.1  --   NEUTROABS 3.9  --   HGB 11.7* 12.9  HCT 35.2* 38.0  MCV 95.4  --   PLT 151  --    Cardiac Enzymes: No results found for this basename: CKTOTAL, CKMB, CKMBINDEX, TROPONINI,  in the last 168 hours  BNP (last 3 results) No results found for this basename: PROBNP,  in the last 8760 hours CBG:  Recent Labs Lab 01/12/13 1455 01/12/13 1523 01/12/13 1650 01/12/13 1722 01/12/13 1754  GLUCAP 32* 176* 75 57* 111*    Radiological Exams on Admission: No results found.  Assessment/Plan Principal Problem:   Hypoglycemia Active Problems:   ESRD (end stage renal disease)   DM (diabetes mellitus), type  1 with renal complications   Hyperlipidemia   GERD (gastroesophageal reflux disease)   Obesity   Anemia of chronic renal failure    Hypoglycemia due to accidental overdose of insulin.  Anticipate that she will  continue to have problems with hypoglycemia through mid-day tomorrow.  -  Admit to stepdown  -  q1h fingersticks overnight -  D10W at 32ml/h  (may decrease rate and/or turn off overnight if fingersticks remain stable) -  Continue hypoglycemia protocol with amps of D50 prn  -  Type 1 diabetic, however, so will need next dose of long acting insulin tomorrow afternoon.   -  Patient's family needs education about diabetes, for example, checking fingersticks, and consider Rx for glucagon for family at discharge.  DM (diabetes mellitus), type 1 with renal complications s/p kidney pancreas transplant - Hold long and Jullian Previti acting insulin for now -  See above -  A1c  Hypothermia, likely related to hypoglycemia, resolving -  Warming blanket if needed to keep T> 36.0C  ESRD (end stage renal disease), HD T,H,Sa s/p kidney pancreas transplant -  Notified nephrology of admission for HD on Tuesday -  Continue immunosuppression with prednisone, prograf, cellcept  Anemia of chronic renal failure, per nephrology  Migraine headaches -  No known CAD so may try some NSAIDs -  zofran and thorazine prn Chronic bronchitis, stable.  NO wheezing on exam.   Right thigh graft s/p excision of lymphocoele 01/02/13 without complication, working well for HD reportedly Hyperlipidemia, stable. Continue statin GERD (gastroesophageal reflux disease), stable, continue H2 blocker Obesity, stable  Diet:  Renal dialysis Access:  PIV IVF:  D10W at 54ml/h Proph:  heparin  Code Status: full Family Communication: with patient and daughter Disposition Plan: stepdown overnight.    Time spent: 60 min   Renae Fickle [facility] Pager [phone]  If 7PM-7AM, please contact night-coverage [email or web address] Password [facility] 01/12/2013, 6:30 PM

## 2013-01-12 NOTE — ED Notes (Signed)
Pt given orange juice, sandwich and crackers.

## 2013-01-12 NOTE — ED Notes (Signed)
CBG 75. Pt currently eating crackers.

## 2013-01-12 NOTE — ED Notes (Signed)
CBG 111 

## 2013-01-12 NOTE — ED Notes (Addendum)
Pt refused rectal temp. Pt has been drinking OJ, oral temp will not pick up. Tried axillary temp but wouldn't pick up.

## 2013-01-12 NOTE — ED Notes (Signed)
Patient refused her rectal temp. So I gott it oral it ws 94.8

## 2013-01-12 NOTE — ED Notes (Signed)
Pt goes to dialysis T/W/TH. C/o chronic migraines and sts she has one now.

## 2013-01-12 NOTE — ED Provider Notes (Signed)
CSN: [id number]     Arrival date & time 01/12/13  1445 History     First MD Initiated Contact with Patient 01/12/13 1506     Chief Complaint  Patient presents with  . Hypoglycemia   The history is provided by the patient.  pt presents for hyoglycemia Per EMS, pt was found unresponsive and was noted to have hypoglycemia (19) Pt was given glucagon and her glucose improved to 44 Her course is improving It is improved by medications Nothing worsens her symptoms  Pt reports she felt well this morning without any issue and took her lantus (7 units) and humalog (6 units) around 11am today.  She denies fever/vomiting/weakness/cough/cp/sob.  She reports migraine type headache that is not new for her She is on dialysis and due for dialysis tomorrow   Past Medical History  Diagnosis Date  . Hyperlipidemia   . Alopecia   . Obesity   . Iron deficiency   . 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 RLS (restless legs syndrome)   . Respiratory failure   . Injury of conjunctiva and corneal abrasion of right eye without foreign body   . Cellulitis   . Dialysis patient     Tues; Thurs; Sat; [place]  . 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 Anemia   . Pneumonia 2012    "double"  . Blood transfusion 2004    "when I had my transplant"  . Migraine   . 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 Hyperparathyroidism, secondary   . Diabetic retinopathy   . Hypertension     takes Metoprolol and Exforge daily  . Depression     takes Klonopin nightly  . Diabetes mellitus type 1     Pt states diagnosed at age 30 with prior episodes of DKA. S/p pancreatic transplant   . GERD (gastroesophageal reflux disease)     takes Protonix daily  . PONV (postoperative nausea and vomiting)   . Bronchitis   . Migraine   . Asthma   . Shortness of breath   . Emphysema of lung   .  DKA (diabetic ketoacidoses) 07/02/2012  . Angina     none in past 2 years.   Past Surgical History  Procedure Laterality Date  . 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 Thyroglobulin      x 7  . Av fistula placement      right upper arm  . Cholecystectomy  1995  .  hd graft placement/removal      "had 2 in my left upper arm"  . Eye surgery    . Retinopathy surgery    . Tooth extraction  10/10/11    X's two  . Insertion of dialysis catheter  12/08/2011    Procedure: INSERTION OF DIALYSIS CATHETER;  Surgeon: Chuck Hint, MD;  Location: [facility] OR;  Service: Vascular;  Laterality: Left;  . Av fistula placement  01/22/2012    Procedure: INSERTION OF ARTERIOVENOUS (AV) GORE-TEX GRAFT ARM;  Surgeon: Chuck Hint, MD;  Location: [facility] OR;  Service: Vascular;  Laterality: Left;  Ultrasound guided.  . Av fistula placement  03/14/2012    Procedure: INSERTION OF ARTERIOVENOUS (AV) GORE-TEX GRAFT THIGH;  Surgeon: Chuck Hint, MD;  Location: [facility] OR;  Service: Vascular;  Laterality: Right;  .  False aneurysm repair Right 01/02/2013    Procedure: Excision of lymphocele in right thigh.;  Surgeon: Chuck Hint, MD;  Location: [facility] OR;  Service: Vascular;  Laterality: Right;   Family History  Problem Relation Age of Onset  . Hypertension Mother   . Kidney disease Mother   . Diabetes Mother   . Hypertension Father   . Kidney disease Father   . Diabetes Father    History  Substance Use Topics  . Smoking status: Never Smoker   . Smokeless tobacco: Never Used  . Alcohol Use: No   OB History   Grav Para Term Preterm Abortions TAB SAB Ect Mult Living                 Review of Systems  Constitutional: Negative for fever.  Respiratory: Negative for cough and shortness of breath.   Cardiovascular: Negative for chest pain.  Gastrointestinal: Negative for vomiting.  Musculoskeletal: Negative for back pain.  Neurological: Positive for  headaches.  Psychiatric/Behavioral: Negative for agitation.  All other systems reviewed and are negative.    Allergies  Morphine and related; Penicillins; Tramadol; Depakote; and Vicodin  Home Medications   Current Outpatient Rx  Name  Route  Sig  Dispense  Refill  . aspirin EC 81 MG tablet   Oral   Take 81 mg by mouth daily.         . calcium acetate (PHOSLO) 667 MG capsule   Oral   Take 2,001-2,668 mg by mouth 3 (three) times daily with meals. Dosage depends on meal consumed         . clonazePAM (KLONOPIN) 0.5 MG tablet   Oral   Take 1 mg by mouth at bedtime.          Marland Kitchen EXFORGE 10-160 MG per tablet   Oral   Take 1 tablet by mouth at bedtime.          . famotidine (PEPCID) 20 MG tablet   Oral   Take 20 mg by mouth at bedtime.         . Insulin Aspart (NOVOLOG FLEXPEN [place])   Subcutaneous   Inject 4 Units into the skin 2 (two) times daily. Takes 4 units in am and at night, if pt eats lunch she will take insulin before she eats         . insulin glargine (LANTUS) 100 units/mL SOLN   Subcutaneous   Inject 5 Units into the skin 2 (two) times daily.         . metoprolol succinate (TOPROL-XL) 100 MG 24 hr tablet   Oral   Take 100 mg by mouth daily. Take with or immediately following a meal.         . mycophenolate (CELLCEPT) 250 MG capsule   Oral   Take 250 mg by mouth 2 (two) times daily.         . pantoprazole (PROTONIX) 40 MG tablet   Oral   Take 40 mg by mouth daily.         . predniSONE (DELTASONE) 5 MG tablet   Oral   Take 5 mg by mouth daily.          . tacrolimus (PROGRAF) 1 MG capsule   Oral   Take 1 mg by mouth 2 (two) times daily.         Marland Kitchen zolpidem (AMBIEN) 10 MG tablet   Oral   Take 10 mg by mouth at bedtime. For insomnia.         Marland Kitchen  albuterol (PROVENTIL HFA;VENTOLIN HFA) 108 (90 BASE) MCG/ACT inhaler   Inhalation   Inhale 2 puffs into the lungs every 4 (four) hours as needed. For wheezing.           BP 142/76   Pulse 76  Resp 14  SpO2 100%  LMP 01/02/2013 Physical Exam CONSTITUTIONAL: Well developed/well nourished HEAD: Normocephalic/atraumatic EYES: EOMI ENMT: Mucous membranes moist NECK: supple no meningeal signs SPINE:entire spine nontender CV: S1/S2 noted, no murmurs/rubs/gallops noted LUNGS: Lungs are clear to auscultation bilaterally, no apparent distress ABDOMEN: soft, nontender, no rebound or guarding GU:no cva tenderness NEURO: Pt is awake/alert, moves all extremitiesx4, GCS 15, no focal weakness noted EXTREMITIES: pulses normal, full ROM Right groin HD access noted, thrill noted, no overlying erythema noted SKIN: warm, color normal PSYCH: no abnormalities of mood noted  ED Course   Procedures   Labs Reviewed  GLUCOSE, CAPILLARY - Abnormal; Notable for the following:    Glucose-Capillary 32 (*)    All other components within normal limits  GLUCOSE, CAPILLARY - Abnormal; Notable for the following:    Glucose-Capillary 176 (*)    All other components within normal limits  BASIC METABOLIC PANEL  CBC WITH DIFFERENTIAL   3:39 PM Pt is a brittle diabetic s/p failed K-P transplant.  She had significant episode of hpyoglycemia today that is improved (IV access gained in left EJ by previous physician) She had recent ED visit for hypoglycemia Will follow closely.  She is now taking PO fluids 5:08 PM Despite oral intake as well as D50, pt is now trending towards hypoglycemia Will admit D/w [facility], will admit for OBS Pt agreeable Pt also noted to be hypothermic  MDM  Nursing notes including past medical history and social history reviewed and considered in documentation Labs/vital reviewed and considered Previous records reviewed and considered- recent ED visit reviewed     Date: 01/12/2013 1512  Rate: 75  Rhythm: normal sinus rhythm  QRS Axis: normal  Intervals: QT prolonged  ST/T Wave abnormalities: nonspecific ST changes  Conduction Disutrbances:none  Narrative  Interpretation:   Old EKG Reviewed: changes noted prolonged QT on this EKG       Joya Gaskins, MD 01/12/13 1709

## 2013-01-13 DIAGNOSIS — R509 Fever, unspecified: Secondary | ICD-10-CM

## 2013-01-13 DIAGNOSIS — N186 End stage renal disease: Secondary | ICD-10-CM

## 2013-01-13 LAB — GLUCOSE, CAPILLARY
Glucose-Capillary: 137 mg/dL — ABNORMAL HIGH (ref 70–99)
Glucose-Capillary: 161 mg/dL — ABNORMAL HIGH (ref 70–99)
Glucose-Capillary: 172 mg/dL — ABNORMAL HIGH (ref 70–99)
Glucose-Capillary: 191 mg/dL — ABNORMAL HIGH (ref 70–99)
Glucose-Capillary: 129 mg/dL — ABNORMAL HIGH (ref 70–99)
Glucose-Capillary: 41 mg/dL — CL (ref 70–99)
Glucose-Capillary: 75 mg/dL (ref 70–99)

## 2013-01-13 LAB — HEMOGLOBIN A1C: Hgb A1c MFr Bld: 10.8 % — ABNORMAL HIGH (ref <5.7)

## 2013-01-13 LAB — RENAL FUNCTION PANEL
Albumin: 3.1 g/dL — ABNORMAL LOW (ref 3.5–5.2)
Chloride: 93 mEq/L — ABNORMAL LOW (ref 96–112)
GFR calc non Af Amer: 4 mL/min — ABNORMAL LOW (ref >90)
Phosphorus: 1.1 mg/dL — ABNORMAL LOW (ref 2.3–4.6)
Potassium: 5.5 mEq/L — ABNORMAL HIGH (ref 3.5–5.1)

## 2013-01-13 LAB — CBC
MCH: 32.2 pg (ref 26.0–34.0)
MCHC: 34 g/dL (ref 30.0–36.0)
MCV: 94.9 fL (ref 78.0–100.0)
Platelets: 168 10*3/uL (ref 150–400)
RBC: 3.69 MIL/uL — ABNORMAL LOW (ref 3.87–5.11)
RDW: 13.9 % (ref 11.5–15.5)

## 2013-01-13 MED ORDER — INSULIN ASPART 100 UNIT/ML ~~LOC~~ SOLN
0.0000 [IU] | Freq: Every day | SUBCUTANEOUS | Status: DC
  Administered 2013-01-16: 3 [IU] via SUBCUTANEOUS

## 2013-01-13 MED ORDER — HEPARIN SODIUM (PORCINE) 1000 UNIT/ML DIALYSIS: 1000.0000 [IU] | INTRAMUSCULAR | Status: DC | PRN

## 2013-01-13 MED ORDER — HEPARIN SODIUM (PORCINE) 1000 UNIT/ML DIALYSIS
5000.0000 [IU] | Freq: Once | INTRAMUSCULAR | Status: AC
  Administered 2013-01-13: 5000 [IU] via INTRAVENOUS_CENTRAL

## 2013-01-13 MED ORDER — LIDOCAINE-PRILOCAINE 2.5-2.5 % EX CREA: 1.0000 "application " | TOPICAL_CREAM | CUTANEOUS | Status: DC | PRN

## 2013-01-13 MED ORDER — PENTAFLUOROPROP-TETRAFLUOROETH EX AERO: 1.0000 "application " | INHALATION_SPRAY | CUTANEOUS | Status: DC | PRN

## 2013-01-13 MED ORDER — NEPRO/CARBSTEADY PO LIQD: 237.0000 mL | ORAL | Status: DC | PRN

## 2013-01-13 MED ORDER — LIVING WELL WITH DIABETES BOOK
Freq: Once | Status: DC
  Filled 2013-01-13 (×3): qty 1

## 2013-01-13 MED ORDER — ALTEPLASE 2 MG IJ SOLR
2.0000 mg | Freq: Once | INTRAMUSCULAR | Status: DC | PRN
  Filled 2013-01-13: qty 2

## 2013-01-13 MED ORDER — INSULIN ASPART 100 UNIT/ML ~~LOC~~ SOLN: 0.0000 [IU] | Freq: Three times a day (TID) | SUBCUTANEOUS | Status: DC

## 2013-01-13 MED ORDER — SUMATRIPTAN SUCCINATE 50 MG PO TABS
50.0000 mg | ORAL_TABLET | Freq: Once | ORAL | Status: AC
  Administered 2013-01-13: 50 mg via ORAL
  Filled 2013-01-13: qty 1

## 2013-01-13 MED ORDER — INSULIN ASPART 100 UNIT/ML ~~LOC~~ SOLN
0.0000 [IU] | Freq: Three times a day (TID) | SUBCUTANEOUS | Status: DC
  Administered 2013-01-13: 7 [IU] via SUBCUTANEOUS
  Administered 2013-01-13: 1 [IU] via SUBCUTANEOUS
  Administered 2013-01-14: 7 [IU] via SUBCUTANEOUS
  Administered 2013-01-15: 3 [IU] via SUBCUTANEOUS
  Administered 2013-01-15: 5 [IU] via SUBCUTANEOUS
  Administered 2013-01-16: 2 [IU] via SUBCUTANEOUS

## 2013-01-13 MED ORDER — DOXERCALCIFEROL 4 MCG/2ML IV SOLN: 1.0000 ug | INTRAVENOUS | Status: DC

## 2013-01-13 MED ORDER — DOXERCALCIFEROL 4 MCG/2ML IV SOLN
INTRAVENOUS | Status: AC
  Administered 2013-01-13: 1 ug via INTRAVENOUS
  Filled 2013-01-13: qty 2

## 2013-01-13 MED ORDER — ONDANSETRON HCL 4 MG/2ML IJ SOLN
INTRAMUSCULAR | Status: AC
  Filled 2013-01-13: qty 2

## 2013-01-13 MED ORDER — SODIUM CHLORIDE 0.9 % IV SOLN: 100.0000 mL | INTRAVENOUS | Status: DC | PRN

## 2013-01-13 MED ORDER — INSULIN GLARGINE 100 UNIT/ML ~~LOC~~ SOLN
5.0000 [IU] | Freq: Two times a day (BID) | SUBCUTANEOUS | Status: DC
  Administered 2013-01-13 – 2013-01-17 (×7): 5 [IU] via SUBCUTANEOUS
  Filled 2013-01-13 (×9): qty 0.05

## 2013-01-13 MED ORDER — INSULIN ASPART 100 UNIT/ML ~~LOC~~ SOLN
4.0000 [IU] | Freq: Two times a day (BID) | SUBCUTANEOUS | Status: DC
  Administered 2013-01-13 – 2013-01-15 (×3): 4 [IU] via SUBCUTANEOUS

## 2013-01-13 MED ORDER — LIDOCAINE HCL (PF) 1 % IJ SOLN: 5.0000 mL | INTRAMUSCULAR | Status: DC | PRN

## 2013-01-13 NOTE — Progress Notes (Signed)
Patient has been transferred to 6700 bed 28 via wheelchair. Phone report was called to Nancy,rn. Patient is aware of the transfer.

## 2013-01-13 NOTE — Progress Notes (Signed)
Utilization review completed.  

## 2013-01-13 NOTE — Progress Notes (Signed)
[facility] Progress Note [place] TEAM 1 - Stepdown/ICU TEAM   [name] GEX:[id number] DOB: 1970/08/10 DOA: 01/12/2013 PCP: [name], MD  Brief narrative: 42 y/o female with ESRD, and T 1 DM admitted for hypoglycemia. Pt gave herself AM Lantus and Novolog and later became sleepy. Daughter suspected pt had not taken the insulin and asked pt how much to give. Apparently pt told her 15 and therefore she was given 15 U of Lantus and Novolog.   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 NO wheezing on exam.   Right thigh graft s/p excision of lymphocoele 01/02/13 without complication - working well for HD reportedly   Code Status: full Family Communication: none Disposition Plan: transfer to med/surg  Consultants: nephro  Procedures: none  Antibiotics: none  DVT prophylaxis: heparin  HPI/Subjective: Pt alert- c/o b/l hand pain and headache for 3 days- explains that daughter gave her extra insulin altough she tried to tell her not to   Objective: Blood pressure 109/78, pulse 97, temperature 99.9 F (37.7 C), temperature source Oral, resp. rate 18, height 5\' 1"  (1.549 m), weight 72.2 kg (159 lb 2.8 oz), last menstrual period 01/02/2013, SpO2 100.00%.  Intake/Output Summary (Last 24 hours)  at 01/13/13 1836 Last data filed at 01/13/13 1550  Gross per 24 hour  Intake   1785 ml  Output   2000 ml  Net   -215 ml     Exam: General: No acute respiratory distress Lungs: Clear to auscultation bilaterally without wheezes or crackles Cardiovascular: Regular rate and rhythm without murmur gallop or rub normal S1 and S2 Abdomen: Nontender, nondistended, soft, bowel sounds positive, no rebound, no ascites, no appreciable mass Extremities: No significant cyanosis, clubbing, or edema bilateral lower extremities  Data Reviewed: Basic Metabolic Panel:  Recent Labs Lab 01/12/13 1510 01/12/13 1538 01/12/13 1915 01/13/13 1142  NA 131* 133*  --  128*  K 3.9 3.9  --  5.5*  CL 94* 99  --  93*  CO2 24  --   --  22  GLUCOSE 183* 182*  --  92  BUN 39* 42*  --  57*  CREATININE 8.84* 9.10* 9.25* 10.73*  CALCIUM 10.3  --   --  9.7  PHOS  --   --   --  1.1*   Liver Function Tests:  Recent Labs Lab 01/13/13 1142  ALBUMIN 3.1*   No results found for this basename: LIPASE, AMYLASE,  in the last 168 hours No results found for this basename: AMMONIA,  in the last 168 hours CBC:  Recent Labs Lab 01/12/13 1510 01/12/13 1538 01/12/13 1915  WBC 6.1  --  5.8  NEUTROABS 3.9  --   --   HGB 11.7* 12.9 11.9*  HCT 35.2*  38.0 35.0*  MCV 95.4  --  94.9  PLT 151  --  168   Cardiac Enzymes: No results found for this basename: CKTOTAL, CKMB, CKMBINDEX, TROPONINI,  in the last 168 hours BNP (last 3 results) No results found for this basename: PROBNP,  in the last 8760 hours CBG:  Recent Labs Lab 01/13/13 0332 01/13/13 0445 01/13/13 0606 01/13/13 0801 01/13/13 1609  GLUCAP 172* 220* 221* 302* 129*    Recent Results (from the past 240 hour(s))  MRSA PCR SCREENING     Status: None   Collection Time    01/12/13  6:50 PM      Result Value Range Status   MRSA by PCR NEGATIVE  NEGATIVE Final   Comment:            The GeneXpert MRSA Assay (FDA     approved for NASAL specimens      only), is one component of a     comprehensive MRSA colonization     surveillance program. It is not     intended to diagnose MRSA     infection nor to guide or     monitor treatment for     MRSA infections.     Studies:  Recent x-ray studies have been reviewed in detail by the Attending Physician  Scheduled Meds:  Scheduled Meds: . amLODipine  10 mg Oral QHS   And  . irbesartan  150 mg Oral QHS  . aspirin EC  81 mg Oral Daily  . calcium acetate  2,001-2,668 mg Oral TID WC  . famotidine  20 mg Oral QHS  . heparin  5,000 Units Subcutaneous Q8H  . insulin aspart  0-5 Units Subcutaneous QHS  . insulin aspart  0-9 Units Subcutaneous TID WC  . insulin aspart  4 Units Subcutaneous BID WC  . insulin glargine  5 Units Subcutaneous BID  . living well with diabetes book   Does not apply Once  . metoprolol succinate  100 mg Oral Daily  . multivitamin  1 tablet Oral QHS  . mycophenolate  250 mg Oral BID  . pantoprazole  40 mg Oral Daily  . predniSONE  5 mg Oral Q breakfast  . sodium chloride  3 mL Intravenous Q12H  . tacrolimus  1 mg Oral BID   Continuous Infusions:   Time spent on care of this patient: 35 min   Calvert Cantor, MD  [facility] Office  [phone] Pager - Text Page per Loretha Stapler as per below:  On-Call/Text Page:      [email or web address]      password [email or web address]  If 7PM-7AM, please contact night-coverage [email or web address] Password [facility] 01/13/2013, 6:36 PM   LOS: 1 day

## 2013-01-13 NOTE — Procedures (Signed)
Patient was seen on dialysis and the procedure was supervised.  BFR 150- just getting started   Via AVG BP is  98/51.   Patient appears to be tolerating treatment well- put in for 4L goal, will decrease to 2.5  Madalaine Portier A 01/13/2013

## 2013-01-13 NOTE — Progress Notes (Addendum)
Inpatient Diabetes Program Recommendations  AACE/ADA: New Consensus Statement on Inpatient Glycemic Control (2013)  Target Ranges:  Prepandial:   less than 140 mg/dL      Peak postprandial:   less than 180 mg/dL (1-2 hours)      Critically ill patients:  140 - 180 mg/dL   OP education consult received. This pt is long-standing pt with diabetes and now on hemodialysis. This pt is not appropriate for OP education for diabetes. Unless she needs a 1:1 with dietician?  Please specify if needed. Thank you, Lenor Coffin, RN, CNS, Diabetes Coordinator [phone])   Ad.  HgbA1C is not accurate with most renal failure patients with low hemoglobin levels.

## 2013-01-13 NOTE — Consult Note (Signed)
Indication for Consultation:  Management of ESRD/hemodialysis; anemia, hypertension/volume and secondary hyperparathyroidism  HPI: [name] is a 42 y.o. female who was unresponsive yesterday (8/18) secondary to hypoglycemia and brought to ED by EMS. She receives HD TTS at  [facility], HD was initiated in April of this year after her kidney/panc transplant from 2004 failed. Yesterday she states she took both of her insulins (lantus and aspart) without checking her glucose, she said she did eat after taking her insulin but became very drowsy. Her daughter and aunt were with her and thought she had not taken enough insulin so they proceeded to give her additional lantus and aspart. She then became unresponsive, her family called EMS and her glucose was found to be 19. She was given glucagon and D50 in the ED and her fingersticks started to normalize, was 302 this am. She states some people tell her she needs to check her glucose at home and others tell her just to take insulin without checking because she needs it. She reports feeling well prior to yesterday. However she does report that her glucose has been running low at home but is unable to give any exact results. Also, she states that last week she was just in the ED for another episode of hypoglycemia when her family found her unresponsive. Her sugars have been fine since late last night  Past Medical History  Diagnosis Date  . Hyperlipidemia   . Alopecia   . Obesity   . Iron deficiency   . 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 RLS (restless legs syndrome)   . Respiratory failure   . Injury of conjunctiva and corneal abrasion of right eye without foreign body   . Cellulitis   . Dialysis patient     Tues; Thurs; Sat; [place]  . 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 Anemia   . Pneumonia 2012    "double"  . Blood transfusion 2004     "when I had my transplant"  . Migraine   . 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 Hyperparathyroidism, secondary   . Diabetic retinopathy   . Hypertension     takes Metoprolol and Exforge daily  . Depression     takes Klonopin nightly  . Diabetes mellitus type 1     Pt states diagnosed at age 89 with prior episodes of DKA. S/p pancreatic transplant   . GERD (gastroesophageal reflux disease)     takes Protonix daily  . PONV (postoperative nausea and vomiting)   . Bronchitis   . Migraine   . Asthma   . Shortness of breath   . Emphysema of lung   . DKA (diabetic ketoacidoses) 07/02/2012  . Angina     none in past 2 years.   Past Surgical History  Procedure Laterality Date  . 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 Thyroglobulin      x 7  . Av fistula placement      right upper arm  . Cholecystectomy  1995  .  hd graft placement/removal      "had 2 in my left upper arm"  . Eye surgery    . Retinopathy surgery    . Tooth extraction  10/10/11    X's two  . Insertion of dialysis catheter  12/08/2011  Procedure: INSERTION OF DIALYSIS CATHETER;  Surgeon: Chuck Hint, MD;  Location[facility] OR;  Service: Vascular;  Laterality: Left;  . Av fistula placement  01/22/2012    Procedure: INSERTION OF ARTERIOVENOUS (AV) GORE-TEX GRAFT ARM;  Surgeon: Chuck Hint, MD;  Location: [facility] OR;  Service: Vascular;  Laterality: Left;  Ultrasound guided.  . Av fistula placement  03/14/2012    Procedure: INSERTION OF ARTERIOVENOUS (AV) GORE-TEX GRAFT THIGH;  Surgeon: Chuck Hint, MD;  Location: [facility] OR;  Service: Vascular;  Laterality: Right;  . False aneurysm repair Right 01/02/2013    Procedure: Excision of lymphocele in right thigh.;  Surgeon: Chuck Hint, MD;  Location: [facility] OR;  Service: Vascular;  Laterality: Right;   Family History  Problem Relation Age of Onset  . Hypertension Mother   . Kidney  disease Mother   . Diabetes Mother   . Hypertension Father   . Kidney disease Father   . Diabetes Father    Social History:  She states she lives with her daughter. Denies smoking, denies alcohol use  Allergies  Allergen Reactions  . Morphine And Related Nausea And Vomiting and Other (See Comments)    "makes me delerirous"  . Penicillins Anaphylaxis  . Tramadol Nausea And Vomiting  . Depakote [Divalproex Sodium] Other (See Comments)    Pt gets "delirious"  . Vicodin [Hydrocodone-Acetaminophen] Rash   Prior to Admission medications   Medication Sig Start Date End Date Taking? Authorizing Provider  aspirin EC 81 MG tablet Take 81 mg by mouth daily.   Yes Historical Provider, MD  calcium acetate (PHOSLO) 667 MG capsule Take 2,001-2,668 mg by mouth 3 (three) times daily with meals. Dosage depends on meal consumed 03/12/11  Yes Historical Provider, MD  clonazePAM (KLONOPIN) 0.5 MG tablet Take 0.5 mg by mouth at bedtime.    Yes Historical Provider, MD  diphenhydrAMINE (BENADRYL) 50 MG tablet Take 50 mg by mouth at bedtime as needed for sleep.   Yes Historical Provider, MD  EXFORGE 10-160 MG per tablet Take 1 tablet by mouth at bedtime.  06/15/12  Yes Historical Provider, MD  famotidine (PEPCID) 20 MG tablet Take 20 mg by mouth at bedtime.   Yes Historical Provider, MD  gabapentin (NEURONTIN) 100 MG capsule Take 100 mg by mouth daily.   Yes Historical Provider, MD  Insulin Aspart (NOVOLOG FLEXPEN [place]) Inject 4 Units into the skin 2 (two) times daily. Takes 4 units in am and at night, if pt eats lunch she will take insulin before she eats 01/04/13  Yes Rhae Lerner, MD  insulin glargine (LANTUS) 100 units/mL SOLN Inject 5 Units into the skin 2 (two) times daily. 01/04/13  Yes Rhae Lerner, MD  ipratropium (ATROVENT) 0.03 % nasal spray Place 2 sprays into the nose every 12 (twelve) hours.   Yes Historical Provider, MD  metoprolol succinate (TOPROL-XL) 100 MG 24 hr tablet  Take 100 mg by mouth daily. Take with or immediately following a meal.   Yes Historical Provider, MD  multivitamin (RENA-VIT) TABS tablet Take 1 tablet by mouth daily.   Yes Historical Provider, MD  mycophenolate (CELLCEPT) 250 MG capsule Take 250 mg by mouth 2 (two) times daily.   Yes Historical Provider, MD  pantoprazole (PROTONIX) 40 MG tablet Take 40 mg by mouth daily.   Yes Historical Provider, MD  predniSONE (DELTASONE) 5 MG tablet Take 5 mg by mouth daily.  01/21/11  Yes Historical Provider, MD  SUMAtriptan (IMITREX) 50 MG tablet  Take 50 mg by mouth every 2 (two) hours as needed for migraine.   Yes Historical Provider, MD  tacrolimus (PROGRAF) 1 MG capsule Take 1 mg by mouth 2 (two) times daily.   Yes Historical Provider, MD  zolpidem (AMBIEN CR) 12.5 MG CR tablet Take 12.5 mg by mouth at bedtime as needed for sleep.   Yes Historical Provider, MD  albuterol (PROVENTIL HFA;VENTOLIN HFA) 108 (90 BASE) MCG/ACT inhaler Inhale 2 puffs into the lungs every 4 (four) hours as needed. For wheezing.  04/23/11   Nyoka Cowden, MD   Current Facility-Administered Medications  Medication Dose Route Frequency Provider Last Rate Last Dose  . acetaminophen (TYLENOL) tablet 650 mg  650 mg Oral Q6H PRN Renae Fickle, MD       Or  . acetaminophen (TYLENOL) suppository 650 mg  650 mg Rectal Q6H PRN Renae Fickle, MD      . albuterol (PROVENTIL HFA;VENTOLIN HFA) 108 (90 BASE) MCG/ACT inhaler 2 puff  2 puff Inhalation Q4H PRN Renae Fickle, MD      . amLODipine (NORVASC) tablet 10 mg  10 mg Oral QHS Renae Fickle, MD       And  . irbesartan (AVAPRO) tablet 150 mg  150 mg Oral QHS Renae Fickle, MD      . aspirin EC tablet 81 mg  81 mg Oral Daily Renae Fickle, MD      . calcium acetate (PHOSLO) capsule 2,001-2,668 mg  2,001-2,668 mg Oral TID WC Renae Fickle, MD   2,668 mg at 01/13/13 1610  . camphor-menthol (SARNA) lotion   Topical PRN Roma Kayser Schorr, NP      . chlorproMAZINE (THORAZINE) 25  mg in sodium chloride 0.9 % 25 mL IVPB  25 mg Intravenous Q8H PRN Renae Fickle, MD   25 mg at 01/12/13 2237  . dextrose 10 % infusion   Intravenous Continuous Renae Fickle, MD 75 mL/hr at 01/13/13 0909 75 mL/hr at 01/13/13 0909  . diphenhydrAMINE (BENADRYL) capsule 25-50 mg  25-50 mg Oral Q6H PRN Leanne Chang, NP   50 mg at 01/12/13 2255  . famotidine (PEPCID) tablet 20 mg  20 mg Oral QHS Renae Fickle, MD   20 mg at 01/12/13 2115  . heparin injection 5,000 Units  5,000 Units Subcutaneous Q8H Renae Fickle, MD      . insulin aspart (novoLOG) injection 0-5 Units  0-5 Units Subcutaneous QHS Russella Dar, NP      . insulin aspart (novoLOG) injection 0-9 Units  0-9 Units Subcutaneous TID WC Calvert Cantor, MD   7 Units at 01/13/13 0908  . metoprolol succinate (TOPROL-XL) 24 hr tablet 100 mg  100 mg Oral Daily Renae Fickle, MD      . multivitamin (RENA-VIT) tablet 1 tablet  1 tablet Oral QHS Renae Fickle, MD   1 tablet at 01/12/13 2115  . mycophenolate (CELLCEPT) capsule 250 mg  250 mg Oral BID Renae Fickle, MD   250 mg at 01/12/13 2115  . ondansetron (ZOFRAN) tablet 4 mg  4 mg Oral Q6H PRN Renae Fickle, MD       Or  . ondansetron [facility]) injection 4 mg  4 mg Intravenous Q6H PRN Renae Fickle, MD      . pantoprazole (PROTONIX) EC tablet 40 mg  40 mg Oral Daily Renae Fickle, MD   40 mg at 01/12/13 2114  . predniSONE (DELTASONE) tablet 5 mg  5 mg Oral Q breakfast Renae Fickle, MD   5 mg at 01/12/13 2115  .  sodium chloride 0.9 % injection 3 mL  3 mL Intravenous Q12H Renae Fickle, MD      . tacrolimus (PROGRAF) capsule 1 mg  1 mg Oral BID Renae Fickle, MD   1 mg at 01/12/13 2115   Labs: Basic Metabolic Panel:  Recent Labs Lab 01/12/13 1510 01/12/13 1538 01/12/13 1915  NA 131* 133*  --   K 3.9 3.9  --   CL 94* 99  --   CO2 24  --   --   GLUCOSE 183* 182*  --   BUN 39* 42*  --   CREATININE 8.84* 9.10* 9.25*  CALCIUM 10.3  --   --    Liver Function  Tests: No results found for this basename: AST, ALT, ALKPHOS, BILITOT, PROT, ALBUMIN,  in the last 168 hours No results found for this basename: LIPASE, AMYLASE,  in the last 168 hours No results found for this basename: AMMONIA,  in the last 168 hours CBC:  Recent Labs Lab 01/12/13 1510 01/12/13 1538 01/12/13 1915  WBC 6.1  --  5.8  NEUTROABS 3.9  --   --   HGB 11.7* 12.9 11.9*  HCT 35.2* 38.0 35.0*  MCV 95.4  --  94.9  PLT 151  --  168   Cardiac Enzymes: No results found for this basename: CKTOTAL, CKMB, CKMBINDEX, TROPONINI,  in the last 168 hours CBG:  Recent Labs Lab 01/13/13 0226 01/13/13 0332 01/13/13 0445 01/13/13 0606 01/13/13 0801  GLUCAP 191* 172* 220* 221* 302*   Iron Studies: No results found for this basename: IRON, TIBC, TRANSFERRIN, FERRITIN,  in the last 72 hours Studies/Results: No results found.  ROS:  Review of Systems: Gen: Denies any fever, chills, sweats, weight loss. Reports fatigue and weakness. HEENT: No visual complaints, CV: Denies chest pain, angina, palpitations, orthopnea, peripheral edema. Resp: Denies sob, cough, sputum, wheezing, GI: Denies n/v/d or abd pain. GU : anuric MS:  Denies muscle weakness, cramps   Psych: reports confusion with hypoglycemia. Heme: Denies bruising, bleeding, and enlarged lymph nodes. Neuro: Reports headache, states gets migraines  No paresthesias.  Reports weakness. Endocrine: DM  Physical Exam: Filed Vitals:   01/13/13 0010 01/13/13 0442 01/13/13 0800 01/13/13 0900  BP: 99/48 140/75  119/50  Pulse: 75 79 104 98  Temp: 98.1 F (36.7 C) 97.6 F (36.4 C)    TempSrc: Oral Oral    Resp: 11 18 25 20   Height:      Weight:      SpO2: 97% 97% 87% 97%     General: Well developed, well nourished, in no acute distress- resting in bed Head: Normocephalic, atraumatic,  mucus membranes are dry Neck: Supple. JVD not elevated. No carotid bruits Lungs: Clear bilaterally to auscultation without wheezes,  rales, or rhonchi. Breathing is unlabored but shallow Heart: RRR,  No murmurs, rubs, or gallops appreciated. Abdomen: Soft, non-tender, non-distended with normoactive bowel sounds. No rebound/guarding. No obvious abdominal masses. M-S:  Strength and tone appear normal for age. Lower extremities: no edema, + 2 pedal pulses Neuro: Lethargic, arousable. Alert and oriented X 3. Moves all extremities spontaneously. Skin: warm and dry Psych:  Slow to respond but answers questions appropriately Dialysis Access: R groin AVG + bruit and thrill  Dialysis Orders: TTS @ [facility] 69.5  3 hr     2K   2.5 ca+  Heparin 5000u. 400/AF 1.5  R groin AVG  F160 hectorol 1 mcg    Assessment/Plan:  1.  Hypoglycemia/DM-  Hgb A1c  10.8 CBG 320 this am, infusion of D10W. Pt and family need education regarding checking glucose and insulin administration. Aspart SS ACHS.  Agree- may need a decrease in lantus as well- per primary team 2.  ESRD -  TTS @ [facility] via AVG, K+ 3.9. HD pending for today 3.  Hypertension/volume  - BP 140/70, no edema, no volume excess, wt 71.1 kg. Norvasc 10mg , irbesartan 150 mg- to continue  4.  Anemia  - hgb 11.9- no need for ESA now. Tsat 59 (7/24) 5.  Metabolic bone disease -  Ca+ 10.3   Phos 3.9. (7/24)  PTH 62.5 (7/24)  phoslo with meals. Change to 2.25 Ca bath, hectorol 1 mcg 6.  Nutrition - albumin 3.9 (7/24), reports good appetite. multivitamin  Jetty Duhamel, NP [facility] [phone] 01/13/2013, 9:16 AM   Patient seen and examined, agree with above note with above modifications. 42 year old BF with ESRD after transplant failure who was in her usual state of health when suffered an accidental overdose of insulin yest with resultant hypoglycemia- being managed.  Today is her HD day so will proceed with her regular treatment - may help in clearing her insulin.   Annie Sable, MD 01/13/2013

## 2013-01-14 ENCOUNTER — Inpatient Hospital Stay (HOSPITAL_COMMUNITY): Payer: Medicare Other

## 2013-01-14 DIAGNOSIS — M79609 Pain in unspecified limb: Secondary | ICD-10-CM

## 2013-01-14 DIAGNOSIS — R51 Headache: Secondary | ICD-10-CM

## 2013-01-14 DIAGNOSIS — J189 Pneumonia, unspecified organism: Secondary | ICD-10-CM

## 2013-01-14 LAB — RENAL FUNCTION PANEL
Albumin: 2.9 g/dL — ABNORMAL LOW (ref 3.5–5.2)
BUN: 22 mg/dL (ref 6–23)
CO2: 28 mEq/L (ref 19–32)
Calcium: 8.9 mg/dL (ref 8.4–10.5)
Chloride: 96 mEq/L (ref 96–112)
Creatinine, Ser: 6.85 mg/dL — ABNORMAL HIGH (ref 0.50–1.10)
GFR calc Af Amer: 8 mL/min — ABNORMAL LOW (ref >90)
GFR calc non Af Amer: 7 mL/min — ABNORMAL LOW (ref >90)
Glucose, Bld: 102 mg/dL — ABNORMAL HIGH (ref 70–99)
Phosphorus: 1.7 mg/dL — ABNORMAL LOW (ref 2.3–4.6)
Potassium: 5 mEq/L (ref 3.5–5.1)
Sodium: 134 mEq/L — ABNORMAL LOW (ref 135–145)

## 2013-01-14 LAB — CBC
HCT: 32.7 % — ABNORMAL LOW (ref 36.0–46.0)
Hemoglobin: 10.7 g/dL — ABNORMAL LOW (ref 12.0–15.0)
MCH: 31.6 pg (ref 26.0–34.0)
MCHC: 32.7 g/dL (ref 30.0–36.0)
MCV: 96.5 fL (ref 78.0–100.0)
Platelets: 163 10*3/uL (ref 150–400)
RBC: 3.39 MIL/uL — ABNORMAL LOW (ref 3.87–5.11)
RDW: 14.2 % (ref 11.5–15.5)
WBC: 7.7 10*3/uL (ref 4.0–10.5)

## 2013-01-14 LAB — GLUCOSE, CAPILLARY
Glucose-Capillary: 95 mg/dL (ref 70–99)
Glucose-Capillary: 98 mg/dL (ref 70–99)

## 2013-01-14 MED ORDER — DEXTROSE 5 % IV SOLN
2.0000 g | Freq: Three times a day (TID) | INTRAVENOUS | Status: DC
  Filled 2013-01-14 (×2): qty 2

## 2013-01-14 MED ORDER — VANCOMYCIN HCL IN DEXTROSE 750-5 MG/150ML-% IV SOLN
750.0000 mg | INTRAVENOUS | Status: AC
  Administered 2013-01-15 – 2013-01-17 (×2): 750 mg via INTRAVENOUS
  Filled 2013-01-14 (×3): qty 150

## 2013-01-14 MED ORDER — VANCOMYCIN HCL 10 G IV SOLR
1500.0000 mg | Freq: Once | INTRAVENOUS | Status: AC
  Administered 2013-01-15: 1500 mg via INTRAVENOUS
  Filled 2013-01-14: qty 1500

## 2013-01-14 MED ORDER — OXYCODONE-ACETAMINOPHEN 5-325 MG PO TABS
1.0000 | ORAL_TABLET | Freq: Once | ORAL | Status: AC
  Administered 2013-01-15: 1 via ORAL
  Filled 2013-01-14: qty 1

## 2013-01-14 NOTE — Progress Notes (Signed)
[facility] Progress Note  Subjective:   Said she vomiting during HD yesterday. Denied BP drop or cramping (nursing notes said she cramped). Anorexic. Has nonproductive cough. Does not make urine.  Objective Filed Vitals:   01/13/13 1530 01/13/13 1550 01/13/13 1700 01/13/13 1913  BP: 81/50 109/78  100/50  Pulse: 85 97  95  Temp: 99.2 F (37.3 C)  99.9 F (37.7 C) 101.3 F (38.5 C)  TempSrc: Oral  Oral Oral  Resp: 18 18  18   Height:    5\' 1"  (1.549 m)  Weight:    70.2 kg (154 lb 12.2 oz)  SpO2: 100%  100% 97%   Physical Exam General: NAD but not as animated as usual Heart: RRR  Lungs: bilateral lower crackles Abdomen: obese soft Extremities: no LE edema Dialysis Access: right thigh graft + bruit  Dialysis Orders: TTS @ [facility]  69.5 3 hr 2K 2.5 ca+ Heparin 5000u. 400/AF 1.5 R groin AVG  Optiflux 160  hectorol 1 mcg   Assessment/Plan:  1. Hypoglycemia/DM- Hgb A1c 10.8 CBG labile - down to 41 late yesterday. 95 this am.  She is supposed to get SSI with meals, but not eating so it is being held.  Pt and family need education regarding checking glucose and insulin administration. Lantus 5 bid and SS ACHS- per primary team.  Pt says her daughter does not know how to check BSs, but she plans to teacher her (daughter is 3 and a Consulting civil engineer at [employer]) 2. ESRD - TTS @ [facility] via AVG, K+ 5.5 pre HD yesterday- plan for tomorrow 3. Hypertension/volume - BP on the low side this am. Net UF 2 L - immediate post HD weight   Norvasc 10mg , irbesartan 150 mg- appears post HD weight was 70.2 up 0.7 above EDW; left below EDW at 68.5 the past two HD tmts and able to UF 3-4 L with out difficult. Suspect BP lower now due to infection or receiving BP meds; will write parameters for holding. 4. Anemia - hgb 11.9- no need for ESA now. Tsat 59 (7/24) 5. Metabolic bone disease - Ca+ 9.7 with P 1.1 ?? - repeat renal this am. (7/24) PTH 62.5 phoslo with meals. Hectorol 1 mcg. Evaluate bath 2.25 vs 2.5  for d/c.  Will hold phoslo for now 6. Nutrition - albumin 3.9 (7/24), on Heart Healthy diet - high in K - will change to renal diet  Multivitamin  Fever 101.3 - BC pending; will get CXR. Consider empiric antibiotics as is still on prograf    Sheffield Slider, PA-C [facility] [phone] 01/14/2013,8:28 AM  LOS: 2 days   Patient seen and examined, agree with above note with above modifications. Hypoglycemia continues , now also with fever and low BP- will check CXR as c/o cough- low phos, will hold phoslo- plan for HD tomorrow, have also put parameters on BP meds given lowish BP  Annie Sable, MD 01/14/2013      Additional Objective Labs: Basic Metabolic Panel:  Recent Labs Lab 01/12/13 1510 01/12/13 1538 01/12/13 1915 01/13/13 1142  NA 131* 133*  --  128*  K 3.9 3.9  --  5.5*  CL 94* 99  --  93*  CO2 24  --   --  22  GLUCOSE 183* 182*  --  92  BUN 39* 42*  --  57*  CREATININE 8.84* 9.10* 9.25* 10.73*  CALCIUM 10.3  --   --  9.7  PHOS  --   --   --  1.1*   Liver Function Tests:  Recent Labs Lab 01/13/13 1142  ALBUMIN 3.1*  CBC:  Recent Labs Lab 01/12/13 1510 01/12/13 1538 01/12/13 1915  WBC 6.1  --  5.8  NEUTROABS 3.9  --   --   HGB 11.7* 12.9 11.9*  HCT 35.2* 38.0 35.0*  MCV 95.4  --  94.9  PLT 151  --  168   BCBG:  Recent Labs Lab 01/13/13 1609 01/13/13 2132 01/13/13 2228 01/13/13 2357 01/14/13 0756  GLUCAP 129* 41* 75 98 95  Medications:   . amLODipine  10 mg Oral QHS   And  . irbesartan  150 mg Oral QHS  . aspirin EC  81 mg Oral Daily  . calcium acetate  2,001-2,668 mg Oral TID WC  . famotidine  20 mg Oral QHS  . heparin  5,000 Units Subcutaneous Q8H  . insulin aspart  0-5 Units Subcutaneous QHS  . insulin aspart  0-9 Units Subcutaneous TID WC  . insulin aspart  4 Units Subcutaneous BID WC  . insulin glargine  5 Units Subcutaneous BID  . living well with diabetes book   Does not apply Once  . metoprolol  succinate  100 mg Oral Daily  . multivitamin  1 tablet Oral QHS  . mycophenolate  250 mg Oral BID  . pantoprazole  40 mg Oral Daily  . predniSONE  5 mg Oral Q breakfast  . sodium chloride  3 mL Intravenous Q12H  . tacrolimus  1 mg Oral BID

## 2013-01-14 NOTE — Progress Notes (Signed)
CM Consult Talked to patient about follow up medical care; patient goes to Dr Justice Rocher with [facility] in [place] and is being referred to an Endocrinologist in [place] also; patient stated that she is trying to get on the transplant list. The patient has been a diabetic since 89yrs of age and is very knowledgeable of diabetes. She has a glucometer machine at home and states that she checks her blood glucose before meals and at bedtime. Her daughter is staying with her- 33yrs old/ college student but she is not knowledgeable of diabetes; Patient has a follow up apt with Dr Roseanne Reno Sept 3, 2014; Patient use the insulin pen at home; B Ave Filter RN,BSN,MHA

## 2013-01-14 NOTE — Progress Notes (Signed)
Pt refuses to watch  Diabetes videos for herself and her daughter. States she has had diabetes since she was 11 she knows how to care for herself.  Stated that she would show her daughter how to check her cbg when she got home.

## 2013-01-14 NOTE — Progress Notes (Addendum)
[facility] Progress Note    ARIES KASA ZOX:[id number] DOB: 1970/07/23 DOA: 01/12/2013 PCP: [name], MD  Brief narrative: 42 y/o female with ESRD, and T 1 DM admitted for hypoglycemia. Pt gave herself AM Lantus and Novolog and later became sleepy. Daughter suspected pt had not taken the insulin and asked pt how much to give. Apparently pt told her 15 and therefore she was given 15 U of Lantus and Novolog.   Assessment/Plan: Principal Problem:   Hypoglycemia/  DM (diabetes mellitus), type 1 with renal complications - now resolved - continue decreased Lantus 5u and decreased Novolog from 4u with meals  -sugars so far stable today with np further hypoglycemia  Active Problems:   ESRD (end stage renal disease) - dialysis per renal  Fever/Probable HCAP -unclear source, but CXR this am with Small of infiltrate posterior lateral left base- though no c/o cough . 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 NO wheezing on exam.   Right thigh graft s/p excision of lymphocoele 01/02/13 without complication - working well for HD reportedly   Code Status: full Family Communication: none Disposition Plan: transfer to med/surg  Consultants: nephro  Procedures: none  Antibiotics: none  DVT prophylaxis: heparin  HPI/Subjective: Still with c/o b/l hand pain and headache. Denies cough, no CP  Objective: Blood pressure 99/51, pulse 62, temperature 99.9 F (37.7 C), temperature source  Oral, resp. rate 20, height 5\' 1"  (1.549 m), weight 70.2 kg (154 lb 12.2 oz), last menstrual period 12/20/2012, SpO2 100.00%.  Intake/Output Summary (Last 24 hours) at 01/14/13 1511 Last data filed at 01/14/13 1300  Gross per 24 hour  Intake     25 ml  Output   2000 ml  Net  -1975 ml     Exam: General: No acute respiratory distress Lungs: Clear to auscultation bilaterally, Decreased BS at bases Cardiovascular: Regular rate and rhythm without murmur gallop or rub normal S1 and S2 Abdomen: Nontender, nondistended, soft, bowel sounds positive, no rebound, no ascites, no appreciable mass Extremities: No significant cyanosis, clubbing, or edema bilateral lower extremities  Data Reviewed: Basic Metabolic Panel:  Recent Labs Lab 01/12/13 1510 01/12/13 1538 01/12/13 1915 01/13/13 1142 01/14/13 1031  NA 131* 133*  --  128* 134*  K 3.9 3.9  --  5.5* 5.0  CL 94* 99  --  93* 96  CO2 24  --   --  22 28  GLUCOSE 183* 182*  --  92 102*  BUN 39* 42*  --  57* 22  CREATININE 8.84* 9.10* 9.25* 10.73* 6.85*  CALCIUM 10.3  --   --  9.7 8.9  PHOS  --   --   --  1.1* 1.7*   Liver Function Tests:  Recent Labs Lab 01/13/13 1142 01/14/13 1031  ALBUMIN 3.1* 2.9*   No results found for this basename: LIPASE, AMYLASE,  in the last 168 hours No results found for this basename: AMMONIA,  in the last 168 hours CBC:  Recent Labs Lab 01/12/13  1510 01/12/13 1538 01/12/13 1915 01/14/13 1031  WBC 6.1  --  5.8 7.7  NEUTROABS 3.9  --   --   --   HGB 11.7* 12.9 11.9* 10.7*  HCT 35.2* 38.0 35.0* 32.7*  MCV 95.4  --  94.9 96.5  PLT 151  --  168 163   Cardiac Enzymes: No results found for this basename: CKTOTAL, CKMB, CKMBINDEX, TROPONINI,  in the last 168 hours BNP (last 3 results) No results found for this basename: PROBNP,  in the last 8760 hours CBG:  Recent Labs Lab 01/13/13 2132 01/13/13 2228 01/13/13 2357 01/14/13 0756 01/14/13 1136  GLUCAP 41* 75 98 95 140*    Recent  Results (from the past 240 hour(s))  MRSA PCR SCREENING     Status: None   Collection Time    01/12/13  6:50 PM      Result Value Range Status   MRSA by PCR NEGATIVE  NEGATIVE Final   Comment:            The GeneXpert MRSA Assay (FDA     approved for NASAL specimens     only), is one component of a     comprehensive MRSA colonization     surveillance program. It is not     intended to diagnose MRSA     infection nor to guide or     monitor treatment for     MRSA infections.     Studies:  I have reviewed in detail all Recent x-ray studies . Scheduled Meds:  Scheduled Meds: . amLODipine  10 mg Oral QHS   And  . irbesartan  150 mg Oral QHS  . aspirin EC  81 mg Oral Daily  . famotidine  20 mg Oral QHS  . heparin  5,000 Units Subcutaneous Q8H  . insulin aspart  0-5 Units Subcutaneous QHS  . insulin aspart  0-9 Units Subcutaneous TID WC  . insulin aspart  4 Units Subcutaneous BID WC  . insulin glargine  5 Units Subcutaneous BID  . living well with diabetes book   Does not apply Once  . metoprolol succinate  100 mg Oral Daily  . multivitamin  1 tablet Oral QHS  . mycophenolate  250 mg Oral BID  . pantoprazole  40 mg Oral Daily  . predniSONE  5 mg Oral Q breakfast  . sodium chloride  3 mL Intravenous Q12H  . tacrolimus  1 mg Oral BID   Continuous Infusions:   Time spent on care of this patient: 35 min   Kela Millin, MD [phone] [facility] Office  [phone] Pager - Text Page per Loretha Stapler as per below:  On-Call/Text Page:      [email or web address]      password [email or web address]  If 7PM-7AM, please contact night-coverage [email or web address] Password [email or web address] 01/14/2013, 3:11 PM   LOS: 2 days

## 2013-01-14 NOTE — Progress Notes (Signed)
New Admission Note:   Arrival Method: Transferred via wheelchair with NT and belongings from 2600 Mental Orientation: A+O x4 Telemetry: None  Assessment: Completed  Skin: Warm, dry and intact IV: Two IVs, both in date and NSL  Pain:No complaints of pain at this time  Tubes: None  Safety Measures: Discussed Fall Safety Plan Sheet  Admission: Transfer patient, admission already completed 6700 Orientation: Oriented to the unit, staff and room Family: No family at bedside   Orders have been reviewed and implemented. Will continue to monitor the patient. Call light has been placed within reach and bed alarm has been activated.   Doristine Devoid, RN  Phone number: [phone]

## 2013-01-15 LAB — CBC
HCT: 30.2 % — ABNORMAL LOW (ref 36.0–46.0)
Hemoglobin: 10 g/dL — ABNORMAL LOW (ref 12.0–15.0)
MCH: 31.8 pg (ref 26.0–34.0)
MCHC: 33.1 g/dL (ref 30.0–36.0)
MCV: 96.2 fL (ref 78.0–100.0)
Platelets: 158 10*3/uL (ref 150–400)
RBC: 3.14 MIL/uL — ABNORMAL LOW (ref 3.87–5.11)
RDW: 14.1 % (ref 11.5–15.5)
WBC: 6.2 10*3/uL (ref 4.0–10.5)

## 2013-01-15 LAB — RENAL FUNCTION PANEL
Albumin: 2.9 g/dL — ABNORMAL LOW (ref 3.5–5.2)
BUN: 32 mg/dL — ABNORMAL HIGH (ref 6–23)
CO2: 26 mEq/L (ref 19–32)
Calcium: 8.8 mg/dL (ref 8.4–10.5)
Chloride: 95 mEq/L — ABNORMAL LOW (ref 96–112)
Creatinine, Ser: 8.86 mg/dL — ABNORMAL HIGH (ref 0.50–1.10)
GFR calc Af Amer: 6 mL/min — ABNORMAL LOW (ref >90)
GFR calc non Af Amer: 5 mL/min — ABNORMAL LOW (ref >90)
Glucose, Bld: 246 mg/dL — ABNORMAL HIGH (ref 70–99)
Phosphorus: 2.2 mg/dL — ABNORMAL LOW (ref 2.3–4.6)
Potassium: 4.2 mEq/L (ref 3.5–5.1)
Sodium: 132 mEq/L — ABNORMAL LOW (ref 135–145)

## 2013-01-15 LAB — GLUCOSE, CAPILLARY
Glucose-Capillary: 257 mg/dL — ABNORMAL HIGH (ref 70–99)
Glucose-Capillary: 226 mg/dL — ABNORMAL HIGH (ref 70–99)
Glucose-Capillary: 30 mg/dL — CL (ref 70–99)
Glucose-Capillary: 33 mg/dL — CL (ref 70–99)

## 2013-01-15 MED ORDER — CLONAZEPAM 0.5 MG PO TABS
0.5000 mg | ORAL_TABLET | Freq: Every evening | ORAL | Status: DC | PRN
  Administered 2013-01-15: 0.5 mg via ORAL
  Filled 2013-01-15: qty 1

## 2013-01-15 MED ORDER — LORAZEPAM 1 MG PO TABS
1.0000 mg | ORAL_TABLET | Freq: Once | ORAL | Status: AC
  Administered 2013-01-15: 1 mg via ORAL
  Filled 2013-01-15: qty 1

## 2013-01-15 MED ORDER — DEXTROSE 5 % IV SOLN: 1.0000 g | INTRAVENOUS | Status: DC

## 2013-01-15 MED ORDER — ONDANSETRON HCL 4 MG/2ML IJ SOLN
INTRAMUSCULAR | Status: AC
  Filled 2013-01-15: qty 2

## 2013-01-15 MED ORDER — HYDROMORPHONE HCL 2 MG PO TABS
1.0000 mg | ORAL_TABLET | Freq: Four times a day (QID) | ORAL | Status: DC | PRN
  Administered 2013-01-15 (×2): 2 mg via ORAL
  Filled 2013-01-15 (×2): qty 1

## 2013-01-15 MED ORDER — DEXTROSE 5 % IV SOLN
1.0000 g | INTRAVENOUS | Status: AC
  Administered 2013-01-15 – 2013-01-16 (×2): 1 g via INTRAVENOUS
  Filled 2013-01-15 (×2): qty 1

## 2013-01-15 MED ORDER — DARBEPOETIN ALFA-POLYSORBATE 60 MCG/0.3ML IJ SOLN: 60.0000 ug | INTRAMUSCULAR | Status: DC

## 2013-01-15 MED ORDER — CALCIUM ACETATE 667 MG PO CAPS
2001.0000 mg | ORAL_CAPSULE | Freq: Three times a day (TID) | ORAL | Status: DC
  Administered 2013-01-15 (×2): 2001 mg via ORAL
  Filled 2013-01-15 (×6): qty 3

## 2013-01-15 MED ORDER — DARBEPOETIN ALFA-POLYSORBATE 60 MCG/0.3ML IJ SOLN
INTRAMUSCULAR | Status: AC
  Administered 2013-01-15: 60 ug via INTRAVENOUS
  Filled 2013-01-15: qty 0.3

## 2013-01-15 MED ORDER — PENTAFLUOROPROP-TETRAFLUOROETH EX AERO
INHALATION_SPRAY | CUTANEOUS | Status: AC
  Administered 2013-01-15: 07:00:00
  Filled 2013-01-15: qty 103.5

## 2013-01-15 MED ORDER — ZOLPIDEM TARTRATE 5 MG PO TABS
5.0000 mg | ORAL_TABLET | Freq: Once | ORAL | Status: AC
  Administered 2013-01-15: 5 mg via ORAL
  Filled 2013-01-15: qty 1

## 2013-01-15 NOTE — Progress Notes (Addendum)
[facility] Progress Note    [name] HQI:[id number] DOB: [birth year] DOA: 01/12/2013 PCP: [name], MD  Brief narrative: 42 y/o female with ESRD, and T 1 DM admitted for hypoglycemia. Pt gave herself AM Lantus and Novolog and later became sleepy. Daughter suspected pt had not taken the insulin and asked pt how much to give. Apparently pt told her 15 and therefore she was given 15 U of Lantus and Novolog.   Assessment/Plan: Principal Problem:   Hypoglycemia/  DM (diabetes mellitus), type 1 with renal complications - now resolved - continue decreased Lantus 5u and decreased Novolog from 4u with meals  -sugars so far stable today with no further hypoglycemia  Active Problems:   ESRD (end stage renal disease) - dialysis per renal  Fever/Probable HCAP -unclear source, but CXR this am with Small of infiltrate posterior lateral left base- though no c/o cough . - also noted pt still on immunosupressant/prograf -continue empiric abx for possible HCAP, afebrile overnight and clinically better. 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 NO wheezing on exam.   Right thigh graft s/p excision of lymphocoele 01/02/13 without complication - working well for HD reportedly   Code Status: full Family Communication: none Disposition Plan: transfer to med/surg  Consultants: nephro  Procedures: none  Antibiotics: none  DVT  prophylaxis: heparin  HPI/Subjective: States she feels better overall but still with c/o b/l hand pain and headache. Denies cough, no CP  Objective: Blood pressure 120/71, pulse 100, temperature 98.5 F (36.9 C), temperature source Oral, resp. rate 16, height 5\' 1"  (1.549 m), weight 69.8 kg (153 lb 14.1 oz), last menstrual period 12/20/2012, SpO2 97.00%.  Intake/Output Summary (Last 24 hours) at 01/15/13 1336 Last data filed at 01/15/13 1048  Gross per 24 hour  Intake    240 ml  Output    989 ml  Net   -749 ml     Exam: General: No acute respiratory distress Lungs: Clear to auscultation bilaterally, Decreased BS at bases Cardiovascular: Regular rate and rhythm without murmur gallop or rub normal S1 and S2 Abdomen: Nontender, nondistended, soft, bowel sounds positive, no rebound, no ascites, no appreciable mass Extremities: No significant cyanosis, clubbing, or edema bilateral lower extremities  Data Reviewed: Basic Metabolic Panel:  Recent Labs Lab 01/12/13 1510 01/12/13 1538 01/12/13 1915 01/13/13 1142 01/14/13 1031 01/15/13 0712  NA 131* 133*  --  128* 134* 132*  K 3.9 3.9  --  5.5* 5.0 4.2  CL 94* 99  --  93* 96 95*  CO2 24  --   --  22 28 26   GLUCOSE 183* 182*  --  92 102* 246*  BUN 39* 42*  --  57* 22 32*  CREATININE 8.84* 9.10* 9.25* 10.73* 6.85* 8.86*  CALCIUM 10.3  --   --  9.7 8.9 8.8  PHOS  --   --   --  1.1* 1.7* 2.2*   Liver Function Tests:  Recent Labs  Lab 01/13/13 1142 01/14/13 1031 01/15/13 0712  ALBUMIN 3.1* 2.9* 2.9*   No results found for this basename: LIPASE, AMYLASE,  in the last 168 hours No results found for this basename: AMMONIA,  in the last 168 hours CBC:  Recent Labs Lab 01/12/13 1510 01/12/13 1538 01/12/13 1915 01/14/13 1031 01/15/13 0712  WBC 6.1  --  5.8 7.7 6.2  NEUTROABS 3.9  --   --   --   --   HGB 11.7* 12.9 11.9* 10.7* 10.0*  HCT 35.2* 38.0 35.0* 32.7* 30.2*  MCV 95.4  --  94.9 96.5 96.2  PLT 151  --  168 163  158   Cardiac Enzymes: No results found for this basename: CKTOTAL, CKMB, CKMBINDEX, TROPONINI,  in the last 168 hours BNP (last 3 results) No results found for this basename: PROBNP,  in the last 8760 hours CBG:  Recent Labs Lab 01/14/13 0756 01/14/13 1136 01/14/13 1630 01/14/13 2125 01/15/13 1134  GLUCAP 95 140* 311* 176* 257*    Recent Results (from the past 240 hour(s))  MRSA PCR SCREENING     Status: None   Collection Time    01/12/13  6:50 PM      Result Value Range Status   MRSA by PCR NEGATIVE  NEGATIVE Final   Comment:            The GeneXpert MRSA Assay (FDA     approved for NASAL specimens     only), is one component of a     comprehensive MRSA colonization     surveillance program. It is not     intended to diagnose MRSA     infection nor to guide or     monitor treatment for     MRSA infections.  CULTURE, BLOOD (ROUTINE X 2)     Status: None   Collection Time    01/13/13  8:40 PM      Result Value Range Status   Specimen Description BLOOD RIGHT HAND   Final   Special Requests BOTTLES DRAWN AEROBIC AND ANAEROBIC 10CC   Final   Culture  Setup Time     Final   Value: 01/14/2013 01:30     Performed at [facility]   Culture     Final   Value:        BLOOD CULTURE RECEIVED NO GROWTH TO DATE CULTURE WILL BE HELD FOR 5 DAYS BEFORE ISSUING A FINAL NEGATIVE REPORT     Performed at [facility]   Report Status PENDING   Incomplete  CULTURE, BLOOD (ROUTINE X 2)     Status: None   Collection Time    01/13/13  8:40 PM      Result Value Range Status   Specimen Description BLOOD LEFT HAND   Final   Special Requests BOTTLES DRAWN AEROBIC AND ANAEROBIC 10CC   Final   Culture  Setup Time     Final   Value: 01/14/2013 01:30     Performed at [facility]   Culture     Final   Value:        BLOOD CULTURE RECEIVED NO GROWTH TO DATE CULTURE WILL BE HELD FOR 5 DAYS BEFORE ISSUING A FINAL NEGATIVE REPORT     Performed at [facility]    Report Status PENDING   Incomplete     Studies:  I have reviewed in detail all Recent x-ray studies . Scheduled Meds:  Scheduled Meds: . amLODipine  10  mg Oral QHS   And  . irbesartan  150 mg Oral QHS  . aspirin EC  81 mg Oral Daily  . aztreonam  1 g Intravenous Q24H  . calcium acetate  2,001 mg Oral TID WC  . darbepoetin (ARANESP) injection - DIALYSIS  60 mcg Intravenous Q Thu-HD  . famotidine  20 mg Oral QHS  . heparin  5,000 Units Subcutaneous Q8H  . insulin aspart  0-5 Units Subcutaneous QHS  . insulin aspart  0-9 Units Subcutaneous TID WC  . insulin aspart  4 Units Subcutaneous BID WC  . insulin glargine  5 Units Subcutaneous BID  . living well with diabetes book   Does not apply Once  . metoprolol succinate  100 mg Oral Daily  . multivitamin  1 tablet Oral QHS  . mycophenolate  250 mg Oral BID  . pantoprazole  40 mg Oral Daily  . predniSONE  5 mg Oral Q breakfast  . sodium chloride  3 mL Intravenous Q12H  . tacrolimus  1 mg Oral BID  . vancomycin  750 mg Intravenous Q T,Th,Sa-HD   Continuous Infusions:   Time spent on care of this patient: 35 min   Kela Millin, MD [phone] [facility] Office  [phone] Pager - Text Page per Loretha Stapler as per below:  On-Call/Text Page:      [email or web address]      password [email or web address]  If 7PM-7AM, please contact night-coverage [email or web address] Password [email or web address] 01/15/2013, 1:36 PM   LOS: 3 days

## 2013-01-15 NOTE — Progress Notes (Signed)
[facility] Progress Note  Subjective:   CXR yest for fever showed small area of PNA- started on azactam and vanc- afebrile overnight.  No episodes of hypoglycemia.  C/O migraine and hand stiffness/pain- has appt at the hand center on Monday  Objective Filed Vitals:   01/15/13 0645 01/15/13 0655 01/15/13 0700 01/15/13 0730  BP: 123/61 130/68 128/80 112/61  Pulse: 96 85 80 84  Temp: 97.5 F (36.4 C)     TempSrc: Oral     Resp: 20 17 16 15   Height:      Weight: 69.8 kg (153 lb 14.1 oz)     SpO2: 100% 100%     Physical Exam General: NAD but not as animated as usual- seen on HD Heart: RRR  Lungs: bilateral lower crackles Abdomen: obese soft Extremities: no LE edema Dialysis Access: right thigh graft + bruit- accessed  Dialysis Orders: TTS @ [facility]  69.5 3 hr 2K 2.5 ca+ Heparin 5000u. 400/AF 1.5 R groin AVG  Optiflux 160  hectorol 1 mcg   Assessment/Plan:  1. Hypoglycemia/DM- Hgb A1c 10.8 CBG labile - resolved.  Pt and family need education regarding checking glucose and insulin administration. Lantus 5 bid and SS ACHS- per primary team.  Still on some immunosupp for pancreas as it is working some and to avoid outright rejection 2. ESRD - TTS @ [facility] via AVG, no real issues 3. Hypertension/volume - BP previously low, now better, lower goal today   Norvasc 10mg , irbesartan 150 mg with parameters for holding. 4. Anemia - hgb 11.9 initially, now down to 10- will add a small amount of darbe. Tsat 59 (7/24) 5. Metabolic bone disease - Ca+ 9.7 with P 1.7 on repeat - holding binders 6. Nutrition - albumin dropping with illness  PNA/fever - BC pending; CXR showing PNA- started on vanc/azactam-   Dispo- anticipate inhouse a little longer since just started IV abx   [name] A   01/15/2013,7:41 AM  LOS: 3 days        Additional Objective Labs: Basic Metabolic Panel:  Recent Labs Lab 01/12/13 1510 01/12/13 1538 01/12/13 1915 01/13/13 1142  01/14/13 1031  NA 131* 133*  --  128* 134*  K 3.9 3.9  --  5.5* 5.0  CL 94* 99  --  93* 96  CO2 24  --   --  22 28  GLUCOSE 183* 182*  --  92 102*  BUN 39* 42*  --  57* 22  CREATININE 8.84* 9.10* 9.25* 10.73* 6.85*  CALCIUM 10.3  --   --  9.7 8.9  PHOS  --   --   --  1.1* 1.7*   Liver Function Tests:  Recent Labs Lab 01/13/13 1142 01/14/13 1031  ALBUMIN 3.1* 2.9*  CBC:  Recent Labs Lab 01/12/13 1510  01/12/13 1915 01/14/13 1031 01/15/13 0712  WBC 6.1  --  5.8 7.7 6.2  NEUTROABS 3.9  --   --   --   --   HGB 11.7*  < > 11.9* 10.7* 10.0*  HCT 35.2*  < > 35.0* 32.7* 30.2*  MCV 95.4  --  94.9 96.5 96.2  PLT 151  --  168 163 158  < > = values in this interval not displayed. BCBG:  Recent Labs Lab 01/13/13 2357 01/14/13 0756 01/14/13 1136 01/14/13 1630 01/14/13 2125  GLUCAP 98 95 140* 311* 176*  Medications:   . amLODipine  10 mg Oral QHS   And  . irbesartan  150 mg  Oral QHS  . aspirin EC  81 mg Oral Daily  . [START ON 01/16/2013] aztreonam  1 g Intravenous Q24H  . famotidine  20 mg Oral QHS  . heparin  5,000 Units Subcutaneous Q8H  . insulin aspart  0-5 Units Subcutaneous QHS  . insulin aspart  0-9 Units Subcutaneous TID WC  . insulin aspart  4 Units Subcutaneous BID WC  . insulin glargine  5 Units Subcutaneous BID  . living well with diabetes book   Does not apply Once  . metoprolol succinate  100 mg Oral Daily  . multivitamin  1 tablet Oral QHS  . mycophenolate  250 mg Oral BID  . ondansetron      . pantoprazole  40 mg Oral Daily  . pentafluoroprop-tetrafluoroeth      . predniSONE  5 mg Oral Q breakfast  . sodium chloride  3 mL Intravenous Q12H  . tacrolimus  1 mg Oral BID  . vancomycin  750 mg Intravenous Q T,Th,Sa-HD

## 2013-01-15 NOTE — Progress Notes (Signed)
ANTIBIOTIC CONSULT NOTE - INITIAL  Pharmacy Consult for Vancocin and Azactam Indication: rule out pneumonia  Allergies  Allergen Reactions  . Morphine And Related Nausea And Vomiting and Other (See Comments)    "makes me delerirous"  . Penicillins Anaphylaxis  . Tramadol Nausea And Vomiting  . Depakote [Divalproex Sodium] Other (See Comments)    Pt gets "delirious"  . Vicodin [Hydrocodone-Acetaminophen] Rash    Patient Measurements: Height: 5\' 1"  (154.9 cm) Weight: 155 lb 6.8 oz (70.5 kg) IBW/kg (Calculated) : 47.8  Vital Signs: Temp: 97.8 F (36.6 C) (08/20 2122) Temp src: Oral (08/20 2122) BP: 134/71 mmHg (08/20 2122) Pulse Rate: 94 (08/20 2122)  Labs:  Recent Labs  01/12/13 1510 01/12/13 1538 01/12/13 1915 01/13/13 1142 01/14/13 1031  WBC 6.1  --  5.8  --  7.7  HGB 11.7* 12.9 11.9*  --  10.7*  PLT 151  --  168  --  163  CREATININE 8.84* 9.10* 9.25* 10.73* 6.85*   Estimated Creatinine Clearance: 9.8 ml/min (by C-G formula based on Cr of 6.85).    Microbiology: Recent Results (from the past 720 hour(s))  MRSA PCR SCREENING     Status: None   Collection Time    01/12/13  6:50 PM      Result Value Range Status   MRSA by PCR NEGATIVE  NEGATIVE Final   Comment:            The GeneXpert MRSA Assay (FDA     approved for NASAL specimens     only), is one component of a     comprehensive MRSA colonization     surveillance program. It is not     intended to diagnose MRSA     infection nor to guide or     monitor treatment for     MRSA infections.    Medical History: Past Medical History  Diagnosis Date  . Hyperlipidemia   . Alopecia   . Obesity   . Iron deficiency   . 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 RLS (restless legs syndrome)   . Respiratory failure   . Injury of conjunctiva and corneal abrasion of right eye without foreign body   . Cellulitis   . Dialysis patient     Tues; Thurs;  Sat; [place]  . 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 Anemia   . Pneumonia 2012    "double"  . Blood transfusion 2004    "when I had my transplant"  . Migraine   . 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 Hyperparathyroidism, secondary   . Diabetic retinopathy   . Hypertension     takes Metoprolol and Exforge daily  . Depression     takes Klonopin nightly  . Diabetes mellitus type 1     Pt states diagnosed at age 82 with prior episodes of DKA. S/p pancreatic transplant   . GERD (gastroesophageal reflux disease)     takes Protonix daily  . PONV (postoperative nausea and vomiting)   . Bronchitis   . Migraine   . Asthma   . Shortness of breath   . Emphysema of lung   . DKA (diabetic ketoacidoses) 07/02/2012  . Angina     none in past 2 years.    Medications:  Prescriptions prior to admission  Medication Sig Dispense Refill  .  aspirin EC 81 MG tablet Take 81 mg by mouth daily.      . calcium acetate (PHOSLO) 667 MG capsule Take 2,001-2,668 mg by mouth 3 (three) times daily with meals. Dosage depends on meal consumed      . clonazePAM (KLONOPIN) 0.5 MG tablet Take 0.5 mg by mouth at bedtime.       . diphenhydrAMINE (BENADRYL) 50 MG tablet Take 50 mg by mouth at bedtime as needed for sleep.      Marland Kitchen EXFORGE 10-160 MG per tablet Take 1 tablet by mouth at bedtime.       . famotidine (PEPCID) 20 MG tablet Take 20 mg by mouth at bedtime.      . gabapentin (NEURONTIN) 100 MG capsule Take 100 mg by mouth daily.      . Insulin Aspart (NOVOLOG FLEXPEN Brigantine) Inject 4 Units into the skin 2 (two) times daily. Takes 4 units in am and at night, if pt eats lunch she will take insulin before she eats      . insulin glargine (LANTUS) 100 units/mL SOLN Inject 5 Units into the skin 2 (two) times daily.      Marland Kitchen ipratropium (ATROVENT) 0.03 % nasal spray Place 2 sprays into the nose every 12 (twelve) hours.      . metoprolol  succinate (TOPROL-XL) 100 MG 24 hr tablet Take 100 mg by mouth daily. Take with or immediately following a meal.      . multivitamin (RENA-VIT) TABS tablet Take 1 tablet by mouth daily.      . mycophenolate (CELLCEPT) 250 MG capsule Take 250 mg by mouth 2 (two) times daily.      . pantoprazole (PROTONIX) 40 MG tablet Take 40 mg by mouth daily.      . predniSONE (DELTASONE) 5 MG tablet Take 5 mg by mouth daily.       . SUMAtriptan (IMITREX) 50 MG tablet Take 50 mg by mouth every 2 (two) hours as needed for migraine.      . tacrolimus (PROGRAF) 1 MG capsule Take 1 mg by mouth 2 (two) times daily.      Marland Kitchen zolpidem (AMBIEN CR) 12.5 MG CR tablet Take 12.5 mg by mouth at bedtime as needed for sleep.      Marland Kitchen albuterol (PROVENTIL HFA;VENTOLIN HFA) 108 (90 BASE) MCG/ACT inhaler Inhale 2 puffs into the lungs every 4 (four) hours as needed. For wheezing.        Scheduled:  . amLODipine  10 mg Oral QHS   And  . irbesartan  150 mg Oral QHS  . aspirin EC  81 mg Oral Daily  . [START ON 01/16/2013] aztreonam  1 g Intravenous Q24H  . famotidine  20 mg Oral QHS  . heparin  5,000 Units Subcutaneous Q8H  . insulin aspart  0-5 Units Subcutaneous QHS  . insulin aspart  0-9 Units Subcutaneous TID WC  . insulin aspart  4 Units Subcutaneous BID WC  . insulin glargine  5 Units Subcutaneous BID  . living well with diabetes book   Does not apply Once  . metoprolol succinate  100 mg Oral Daily  . multivitamin  1 tablet Oral QHS  . mycophenolate  250 mg Oral BID  . oxyCODONE-acetaminophen  1 tablet Oral Once  . pantoprazole  40 mg Oral Daily  . predniSONE  5 mg Oral Q breakfast  . sodium chloride  3 mL Intravenous Q12H  . tacrolimus  1 mg Oral BID  . vancomycin  1,500  mg Intravenous Once  . vancomycin  750 mg Intravenous Q T,Th,Sa-HD    Assessment: 42yo female w/ nonproductive cough shows infiltrate on CXR, to begin IV ABX  Goal of Therapy:  Pre-HD vanc level 15-25  Plan:  Will give vancomycin 1500mg  IV x1  then 750mg  IV after each HD; will change aztreonam to 1g IV Q24H after initial dose of 2g as ordered.  Monitor CBC, Cx, levels prn.  Vernard Gambles, PharmD, BCPS  01/15/2013,12:01 AM

## 2013-01-16 ENCOUNTER — Ambulatory Visit: Payer: Medicare Other | Admitting: Adult Health

## 2013-01-16 DIAGNOSIS — K219 Gastro-esophageal reflux disease without esophagitis: Secondary | ICD-10-CM

## 2013-01-16 LAB — GLUCOSE, CAPILLARY: Glucose-Capillary: 83 mg/dL (ref 70–99)

## 2013-01-16 MED ORDER — CALCIUM ACETATE 667 MG PO CAPS
667.0000 mg | ORAL_CAPSULE | Freq: Three times a day (TID) | ORAL | Status: DC
  Administered 2013-01-16 (×2): 667 mg via ORAL
  Filled 2013-01-16 (×6): qty 1

## 2013-01-16 MED ORDER — DOXERCALCIFEROL 4 MCG/2ML IV SOLN
1.0000 ug | INTRAVENOUS | Status: DC
  Filled 2013-01-16: qty 2

## 2013-01-16 MED ORDER — HYDROMORPHONE HCL 2 MG PO TABS
2.0000 mg | ORAL_TABLET | Freq: Four times a day (QID) | ORAL | Status: DC | PRN
  Administered 2013-01-16 – 2013-01-17 (×2): 4 mg via ORAL
  Filled 2013-01-16: qty 2
  Filled 2013-01-16: qty 1

## 2013-01-16 NOTE — Progress Notes (Signed)
Subjective:   Doing ok, appetite coming back slow but still a little nausea. Denies fever and chills, no sob or cough. Thinks she may be discharged tomorrow. HD yest, removed about a liter- BP did drop- overall she looks better  Objective Filed Vitals:   01/15/13 1508 01/15/13 1744 01/15/13 2031 01/16/13 0516  BP: 136/66 152/80 147/69 132/67  Pulse: 100 112 103 89  Temp: 98.6 F (37 C) 98.9 F (37.2 C) 98.3 F (36.8 C) 98 F (36.7 C)  TempSrc: Oral Oral Oral Oral  Resp: 18 18 18 18   Height:      Weight:   70.489 kg (155 lb 6.4 oz)   SpO2: 98% 97% 98% 100%   Physical Exam General: Alert and oriented, no acute distress. Resting comfortable in bed, daughter visiting. Heart: reg rate and rhythm, no murmur Lungs: bilat lower lobe crackles. Respirations unlabored. Abdomen: soft, nontender, + BS x4 Extremities: no edema + pedal pulses Dialysis Access:  R thigh AVG. + bruit and thrill  Dialysis Orders: TTS @ [facility]  69.5 3 hr 2K 2.5 ca+ Heparin 5000u. 400/AF 1.5 R groin AVG Optiflux 160  hectorol 1 mcg    Assessment/Plan: 1. Hypoglycemia/DM-  CBG 30 last night then 82, 96 and 83 this am.  Lantus 5 u bid, aspart with meals. Cont educating pt and family.  2. ESRD - TTS at [facility]. K+ 4.2 HD yesterday- plan for tomorrow first shift in case discharged 3. Anemia - hgb 10. darbepoetin given yesterday 4. Secondary hyperparathyroidism - Ca+ 8.8, phos 2.2 - actually had held phoslo but pt requested yest, will back off to one with meals. Continue hectorol 5. HTN/volume - BP 132/67 Metop and norvasc, no edema 6. Nutrition - renal diet. poor appetite but improving per pt. Multi vitamin 7. Pneumonia- chest xray 8/20 Small of infiltrate posterior lateral left base, Blood cultures no growth to date. On Vancomycin and aztreonam. Afebrile. Consider change to PO abx- per primary team  Jetty Duhamel, NP [facility] [phone] 01/16/2013,9:26 AM  LOS: 4 days   Patient seen and  examined, agree with above note with above modifications. Pt looks better- still issues with low sugar and on IV abx so not ready to go home.  Will plan for HD tomorrow first shift in case ready for discharge tomorrow Annie Sable, MD 01/16/2013     Additional Objective Labs: Basic Metabolic Panel:  Recent Labs Lab 01/13/13 1142 01/14/13 1031 01/15/13 0712  NA 128* 134* 132*  K 5.5* 5.0 4.2  CL 93* 96 95*  CO2 22 28 26   GLUCOSE 92 102* 246*  BUN 57* 22 32*  CREATININE 10.73* 6.85* 8.86*  CALCIUM 9.7 8.9 8.8  PHOS 1.1* 1.7* 2.2*   Liver Function Tests:  Recent Labs Lab 01/13/13 1142 01/14/13 1031 01/15/13 0712  ALBUMIN 3.1* 2.9* 2.9*   No results found for this basename: LIPASE, AMYLASE,  in the last 168 hours CBC:  Recent Labs Lab 01/12/13 1510  01/12/13 1915 01/14/13 1031 01/15/13 0712  WBC 6.1  --  5.8 7.7 6.2  NEUTROABS 3.9  --   --   --   --   HGB 11.7*  < > 11.9* 10.7* 10.0*  HCT 35.2*  < > 35.0* 32.7* 30.2*  MCV 95.4  --  94.9 96.5 96.2  PLT 151  --  168 163 158  < > = values in this interval not displayed. Blood Culture    Component Value Date/Time   SDES BLOOD  RIGHT HAND 01/13/2013 2040   SDES BLOOD LEFT HAND 01/13/2013 2040   SPECREQUEST BOTTLES DRAWN AEROBIC AND ANAEROBIC 10CC 01/13/2013 2040   SPECREQUEST BOTTLES DRAWN AEROBIC AND ANAEROBIC 10CC 01/13/2013 2040   CULT  Value:        BLOOD CULTURE RECEIVED NO GROWTH TO DATE CULTURE WILL BE HELD FOR 5 DAYS BEFORE ISSUING A FINAL NEGATIVE REPORT Performed at [facility] 01/13/2013 2040   CULT  Value:        BLOOD CULTURE RECEIVED NO GROWTH TO DATE CULTURE WILL BE HELD FOR 5 DAYS BEFORE ISSUING A FINAL NEGATIVE REPORT Performed at [facility] 01/13/2013 2040   REPTSTATUS PENDING 01/13/2013 2040   REPTSTATUS PENDING 01/13/2013 2040    Cardiac Enzymes: No results found for this basename: CKTOTAL, CKMB, CKMBINDEX, TROPONINI,  in the last 168 hours CBG:  Recent Labs Lab  01/15/13 1841 01/15/13 1900 01/15/13 2023 01/15/13 2133 01/16/13 0809  GLUCAP 33* 30* 82 96 83   Iron Studies: No results found for this basename: IRON, TIBC, TRANSFERRIN, FERRITIN,  in the last 72 hours @lablastinr3 @ Studies/Results: Dg Chest 2 View  01/14/2013   *RADIOLOGY REPORT*  Clinical Data: Fever and cough  CHEST - 2 VIEW  Comparison:  January 02, 2013  Findings:  There is a small area of infiltrate in the posterior lateral left base.  Lungs are otherwise clear.  Heart is upper normal in size with normal pulmonary vascularity.  No adenopathy. No bone lesions.  IMPRESSION: Small of infiltrate posterior lateral left base.   Original Report Authenticated By: Bretta Bang, M.D.   Medications:   . amLODipine  10 mg Oral QHS   And  . irbesartan  150 mg Oral QHS  . aspirin EC  81 mg Oral Daily  . aztreonam  1 g Intravenous Q24H  . calcium acetate  2,001 mg Oral TID WC  . darbepoetin (ARANESP) injection - DIALYSIS  60 mcg Intravenous Q Thu-HD  . famotidine  20 mg Oral QHS  . heparin  5,000 Units Subcutaneous Q8H  . insulin aspart  0-5 Units Subcutaneous QHS  . insulin aspart  0-9 Units Subcutaneous TID WC  . insulin aspart  4 Units Subcutaneous BID WC  . insulin glargine  5 Units Subcutaneous BID  . living well with diabetes book   Does not apply Once  . metoprolol succinate  100 mg Oral Daily  . multivitamin  1 tablet Oral QHS  . mycophenolate  250 mg Oral BID  . pantoprazole  40 mg Oral Daily  . predniSONE  5 mg Oral Q breakfast  . sodium chloride  3 mL Intravenous Q12H  . tacrolimus  1 mg Oral BID  . vancomycin  750 mg Intravenous Q T,Th,Sa-HD

## 2013-01-16 NOTE — Progress Notes (Signed)
[facility] Progress Note    [name] RUE:[id number] DOB: May 09, 1971 DOA: 01/12/2013 PCP: [name], MD  Brief narrative: 42 y/o female with ESRD, and T 1 DM admitted for hypoglycemia. Pt gave herself AM Lantus and Novolog and later became sleepy. Daughter suspected pt had not taken the insulin and asked pt how much to give. Apparently pt told her 15 and therefore she was given 15 U of Lantus and Novolog.   Assessment/Plan: Principal Problem:   Hypoglycemia/  DM (diabetes mellitus), type 1 with renal complications - now resolved - continue decreased Lantus 5u and decreased Novolog from 4u with meals  -sugars remain stable with no further hypoglycemia  Active Problems:   ESRD (end stage renal disease) - dialysis per renal  Fever/Probable HCAP -unclear source, but CXR this am with Small of infiltrate posterior lateral left base- though no c/o cough . - also noted pt still on immunosupressant/prograf -continue empiric abx for possible HCAP, afebrile overnight and clinically better. 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 NO wheezing on exam.   Right thigh graft s/p excision of lymphocoele 01/02/13 without complication - working well for HD reportedly   Code Status: full Family Communication: none Disposition Plan: plan d/c in  am if continues to improve  Consultants: nephro  Procedures: none  Antibiotics: none  DVT prophylaxis: heparin  HPI/Subjective: States some relief of hand pain with po dilaudid. She denies cough and no SOB  Objective: Blood pressure 126/63, pulse 88, temperature 98.5 F (36.9 C), temperature source Oral, resp. rate 20, height 5\' 1"  (1.549 m), weight 70.489 kg (155 lb 6.4 oz), last menstrual period 12/20/2012, SpO2 100.00%.  Intake/Output Summary (Last 24 hours) at 01/16/13 1304 Last data filed at 01/16/13 0929  Gross per 24 hour  Intake    363 ml  Output      0 ml  Net    363 ml     Exam: General: No acute respiratory distress Lungs: Clear to auscultation bilaterally, Decreased BS at bases Cardiovascular: Regular rate and rhythm without murmur gallop or rub normal S1 and S2 Abdomen: Nontender, nondistended, soft, bowel sounds positive, no rebound, no ascites, no appreciable mass Extremities: No significant cyanosis, clubbing, or edema bilateral lower extremities  Data Reviewed: Basic Metabolic Panel:  Recent Labs Lab 01/12/13 1510 01/12/13 1538 01/12/13 1915 01/13/13 1142 01/14/13 1031 01/15/13 0712  NA 131* 133*  --  128* 134* 132*  K 3.9 3.9  --  5.5* 5.0 4.2  CL 94* 99  --  93* 96 95*  CO2 24  --   --  22 28 26   GLUCOSE 183* 182*  --  92 102* 246*  BUN 39* 42*  --  57* 22 32*  CREATININE 8.84* 9.10* 9.25* 10.73* 6.85* 8.86*  CALCIUM 10.3  --   --  9.7 8.9  8.8  PHOS  --   --   --  1.1* 1.7* 2.2*   Liver Function Tests:  Recent Labs Lab 01/13/13 1142 01/14/13 1031 01/15/13 0712  ALBUMIN 3.1* 2.9* 2.9*   No results found for this basename: LIPASE, AMYLASE,  in the last 168 hours No results found for this basename: AMMONIA,  in the last 168 hours CBC:  Recent Labs Lab 01/12/13 1510 01/12/13 1538 01/12/13 1915 01/14/13 1031 01/15/13 0712  WBC 6.1  --  5.8 7.7 6.2  NEUTROABS 3.9  --   --   --   --   HGB 11.7* 12.9 11.9* 10.7* 10.0*  HCT  35.2* 38.0 35.0* 32.7* 30.2*  MCV 95.4  --  94.9 96.5 96.2  PLT 151  --  168 163 158   Cardiac Enzymes: No results found for this basename: CKTOTAL, CKMB, CKMBINDEX, TROPONINI,  in the last 168 hours BNP (last 3 results) No results found for this basename: PROBNP,  in the last 8760 hours CBG:  Recent Labs Lab 01/15/13 1900 01/15/13 2023 01/15/13 2133 01/16/13 0809 01/16/13 1143  GLUCAP 30* 82 96 83 152*    Recent Results (from the past 240 hour(s))  MRSA PCR SCREENING     Status: None   Collection Time    01/12/13  6:50 PM      Result Value Range Status   MRSA by PCR NEGATIVE  NEGATIVE Final   Comment:            The GeneXpert MRSA Assay (FDA     approved for NASAL specimens     only), is one component of a     comprehensive MRSA colonization     surveillance program. It is not     intended to diagnose MRSA     infection nor to guide or     monitor treatment for     MRSA infections.  CULTURE, BLOOD (ROUTINE X 2)     Status: None   Collection Time    01/13/13  8:40 PM      Result Value Range Status   Specimen Description BLOOD RIGHT HAND   Final   Special Requests BOTTLES DRAWN AEROBIC AND ANAEROBIC 10CC   Final   Culture  Setup Time     Final   Value: 01/14/2013 01:30     Performed at [facility]   Culture     Final   Value:        BLOOD CULTURE RECEIVED NO GROWTH TO DATE CULTURE WILL BE HELD FOR 5 DAYS BEFORE ISSUING A FINAL NEGATIVE REPORT     Performed at [facility]   Report Status PENDING   Incomplete  CULTURE, BLOOD (ROUTINE X 2)     Status: None   Collection Time    01/13/13  8:40 PM      Result Value Range Status   Specimen Description BLOOD LEFT HAND   Final   Special Requests BOTTLES DRAWN AEROBIC AND ANAEROBIC 10CC   Final   Culture  Setup Time     Final   Value: 01/14/2013 01:30     Performed at [facility]   Culture     Final   Value:        BLOOD CULTURE RECEIVED NO GROWTH TO DATE CULTURE WILL BE HELD FOR 5 DAYS  BEFORE ISSUING A FINAL NEGATIVE REPORT     Performed at [facility]   Report Status PENDING   Incomplete  Studies:  I have reviewed in detail all Recent x-ray studies . Scheduled Meds:  Scheduled Meds: . amLODipine  10 mg Oral QHS   And  . irbesartan  150 mg Oral QHS  . aspirin EC  81 mg Oral Daily  . aztreonam  1 g Intravenous Q24H  . calcium acetate  667 mg Oral TID WC  . darbepoetin (ARANESP) injection - DIALYSIS  60 mcg Intravenous Q Thu-HD  . [START ON 01/17/2013] doxercalciferol  1 mcg Intravenous Q T,Th,Sa-HD  . famotidine  20 mg Oral QHS  . heparin  5,000 Units Subcutaneous Q8H  . insulin aspart  0-5 Units Subcutaneous QHS  . insulin aspart  0-9 Units Subcutaneous TID WC  . insulin aspart  4 Units Subcutaneous BID WC  . insulin glargine  5 Units Subcutaneous BID  . living well with diabetes book   Does not apply Once  . metoprolol succinate  100 mg Oral Daily  . multivitamin  1 tablet Oral QHS  . mycophenolate  250 mg Oral BID  . pantoprazole  40 mg Oral Daily  . predniSONE  5 mg Oral Q breakfast  . sodium chloride  3 mL Intravenous Q12H  . tacrolimus  1 mg Oral BID  . vancomycin  750 mg Intravenous Q T,Th,Sa-HD   Continuous Infusions:   Time spent on care of this patient: 25 min   Kela Millin, MD [phone] [facility] Office  [phone] Pager - Text Page per Loretha Stapler as per below:  On-Call/Text Page:      [email or web address]      password [email or web address]  If 7PM-7AM, please contact night-coverage [email or web address] Password [email or web address] 01/16/2013, 1:04 PM   LOS: 4 days

## 2013-01-16 NOTE — Progress Notes (Addendum)
Inpatient Diabetes Program Recommendations  AACE/ADA: New Consensus Statement on Inpatient Glycemic Control (2013)  Target Ranges:  Prepandial:   less than 140 mg/dL      Peak postprandial:   less than 180 mg/dL (1-2 hours)      Critically ill patients:  140 - 180 mg/dL    Results for [name], [name] (MRN [id number]) as of 01/16/2013 10:55  Ref. Range 01/15/2013 11:34 01/15/2013 15:47 01/15/2013 18:41 01/15/2013 19:00 01/15/2013 20:23 01/15/2013 21:33  Glucose-Capillary Latest Range: 70-99 mg/dL 409 (H) 811 (H) 33 (LL) 30 (LL) 82 96    **Patient with hypoglycemia yesterday at supper (CBG 33 mg/dl).  Hypoglycemia likely caused by 2 doses of Novolog given close together (patient received 5 units of Novolog at 11am for CBG of 257 mg/dl and then another 7 units of Novolog at 4pm for CBG of 226 mg/dl + 4 units of meal coverage).  Patient is very sensitive to insulin and holds onto insulin longer due to her ESRD.  **MD- Please consider decreasing Novolog meal coverage to 3 units tid with meals (patient still needs carbohydrate coverage, may just need less)   Will follow. [name] RN, MSN, CDE Diabetes Coordinator Inpatient Diabetes Program [phone]

## 2013-01-16 NOTE — Progress Notes (Signed)
Utilization review completed.  

## 2013-01-17 LAB — BASIC METABOLIC PANEL
BUN: 21 mg/dL (ref 6–23)
CO2: 27 mEq/L (ref 19–32)
Chloride: 96 mEq/L (ref 96–112)
GFR calc non Af Amer: 6 mL/min — ABNORMAL LOW (ref >90)
Glucose, Bld: 114 mg/dL — ABNORMAL HIGH (ref 70–99)
Potassium: 4.5 mEq/L (ref 3.5–5.1)
Sodium: 133 mEq/L — ABNORMAL LOW (ref 135–145)

## 2013-01-17 MED ORDER — SODIUM CHLORIDE 0.9 % IV SOLN: 100.0000 mL | INTRAVENOUS | Status: DC | PRN

## 2013-01-17 MED ORDER — HEPARIN SODIUM (PORCINE) 1000 UNIT/ML DIALYSIS: 1000.0000 [IU] | INTRAMUSCULAR | Status: DC | PRN

## 2013-01-17 MED ORDER — ALTEPLASE 2 MG IJ SOLR: 2.0000 mg | Freq: Once | INTRAMUSCULAR | Status: DC | PRN

## 2013-01-17 MED ORDER — HYDROMORPHONE HCL 2 MG PO TABS
ORAL_TABLET | ORAL | Status: AC
  Administered 2013-01-17: 2 mg
  Filled 2013-01-17: qty 2

## 2013-01-17 MED ORDER — LIDOCAINE HCL (PF) 1 % IJ SOLN: 5.0000 mL | INTRAMUSCULAR | Status: DC | PRN

## 2013-01-17 MED ORDER — LEVOFLOXACIN IN D5W 750 MG/150ML IV SOLN
750.0000 mg | Freq: Once | INTRAVENOUS | Status: AC
  Administered 2013-01-17: 750 mg via INTRAVENOUS
  Filled 2013-01-17: qty 150

## 2013-01-17 MED ORDER — HYDROMORPHONE HCL 2 MG PO TABS: 2.0000 mg | ORAL_TABLET | Freq: Four times a day (QID) | ORAL | Status: DC | PRN

## 2013-01-17 MED ORDER — DIPHENHYDRAMINE HCL 25 MG PO CAPS
ORAL_CAPSULE | ORAL | Status: AC
  Filled 2013-01-17: qty 2

## 2013-01-17 MED ORDER — PENTAFLUOROPROP-TETRAFLUOROETH EX AERO
INHALATION_SPRAY | CUTANEOUS | Status: AC
  Filled 2013-01-17: qty 103.5

## 2013-01-17 MED ORDER — LIDOCAINE-PRILOCAINE 2.5-2.5 % EX CREA
1.0000 "application " | TOPICAL_CREAM | CUTANEOUS | Status: DC | PRN
  Filled 2013-01-17: qty 5

## 2013-01-17 MED ORDER — LEVOFLOXACIN IN D5W 500 MG/100ML IV SOLN: 500.0000 mg | INTRAVENOUS | Status: DC

## 2013-01-17 MED ORDER — NEPRO/CARBSTEADY PO LIQD
237.0000 mL | ORAL | Status: DC | PRN
  Filled 2013-01-17: qty 237

## 2013-01-17 MED ORDER — HEPARIN SODIUM (PORCINE) 1000 UNIT/ML DIALYSIS: 5000.0000 [IU] | Freq: Once | INTRAMUSCULAR | Status: DC

## 2013-01-17 MED ORDER — LEVOFLOXACIN 500 MG PO TABS: 500.0000 mg | ORAL_TABLET | ORAL | Status: DC

## 2013-01-17 MED ORDER — ONDANSETRON HCL 4 MG/2ML IJ SOLN
INTRAMUSCULAR | Status: AC
  Filled 2013-01-17: qty 2

## 2013-01-17 MED ORDER — LIVING WELL WITH DIABETES BOOK: Status: DC

## 2013-01-17 MED ORDER — PENTAFLUOROPROP-TETRAFLUOROETH EX AERO: 1.0000 "application " | INHALATION_SPRAY | CUTANEOUS | Status: DC | PRN

## 2013-01-17 NOTE — Discharge Summary (Signed)
Physician Discharge Summary  STARLING JESSIE ONG:[id number] DOB: May 06, 1971 DOA: 01/12/2013  PCP: [name], MD  Admit date: 01/12/2013 Discharge date: 01/17/2013  Time spent: >30 minutes  Recommendations for Outpatient Follow-up:  Follow-up Information   Follow up with [facility], [name], MD. (in 1-2weeks, call for appt)    Specialty:  Family Medicine   Contact information:   [address] [place] [address] [phone]       Please follow up. (Renal, as directed for dialysis)       Please follow up. (Hand center as scheduled)        Discharge Diagnoses:  Principal Problem:   Hypoglycemia Active Problems:   ESRD (end stage renal disease)   DM (diabetes mellitus), type 1 with renal complications   Hyperlipidemia   GERD (gastroesophageal reflux disease)   Obesity   Anemia of chronic renal failure   Fever, unspecified   Discharge Condition: improved/stable  Diet recommendation: modified carb  Filed Weights   01/15/13 0645 01/15/13 2031 01/17/13 0717  Weight: 69.8 kg (153 lb 14.1 oz) 70.489 kg (155 lb 6.4 oz) 72.3 kg (159 lb 6.3 oz)    History of present illness:  The patient is a 42 y.o. year-old female with history of ESRD T,H,Sa, pseudoaneurysm of the right thigh AVG, T1DM, HTN, HLD who presents with hypoglycemia. The patient was last at their baseline health yesterday. This morning, she states she gave herself her insulin. She is supposed to take 7 units of lantus and 6 units of aspart. She does not always check her fingersticks before she administers her insulin. She was very hungry today during the day and then became somewhat sleepy. Her family thought she had not given herself her insulin and asked her how much insulin she was supposed to get and she answered "15 units," however, she was confused by her hypoglycemia. They gave her an additional 15 units of lantus and 15 units of aspart. She subsequently became unresponsive and so  the family called EMS. Her glucose was 19 when EMS arrived. She was given glucagon and FS rose to 44. Since being in the ER, she has received 2 amps of D50 (50mL each) and her fingersticks have been 32 > 176 > 75 > 57 > 111. She was initially unresponsive, but has awoken. Her slurred speech, confusion are improving. She was initially hypothermic, however, her body temperature is rising slowly with warm blankets. She is being admitted to the stepdown unit for q1h fingersticks overnight and bearhugger if needed.    Hospital Course:  Principal Problem:  Hypoglycemia/ DM (diabetes mellitus), type 1 with renal complications  - As discussed above pt was given glucagon per EMS and D50 in ED and was subsequently placed on D10 on admission - resolved and D10 was dc'ed Pt was placed on  Lantus 5u and Novolog 4u with meals - the hypoglycemia resolved and sugars remained stable. -DM coordinator was consulted for DM education -she is to follow up oupt Active Problems:  ESRD (end stage renal disease)  - dialysis per renal  Fever/Probable HCAP  -Pt had fever in the hospital and CXR this showed Small of infiltrate posterior lateral left base- though no c/o cough .  - also noted pt still on immunosupressant/prograf  -she was placed on empiric abx for possible HCAP>>defervesed and remained afebrile and hemodynamically stable  - blood cultures NGTD  -abx were deescalated as she remained afebrile with no leukocytosis>> changed to oral levaquin which  she is to continue upon d/c Failed kidney pancreas transplant  -still on immunosuppressants  Chronic headaches  -recently was given a prescription for Imitrex but had not yet tried it prior to admission-  She had a trial of 100 mg of imitrex in the hospital but continued to c/o headaches  -she reported no relief with tylenol, had some relief with po dilaudid, and  is to followup oupt B/l hand pain  -ongoing for 1 month -states pain not controlled with tylenol but  allerigic to morphine, vicodin, and ultram  -stated she has tolerated dilaudid in the past>> reported some relief with oral dilaudid will increase dose  -she has appt at hand center on Monday upon d/c Chronic bronchitis  - stable. NO wheezing on exam.  Right thigh graft s/p excision of lymphocoele 01/02/13 without complication  - working well for HD reportedly   Procedures:  none  Consultations:  Renal  Discharge Exam: Filed Vitals:   01/17/13 1030  BP: 126/62  Pulse: 83  Temp:   Resp: 14   Exam:  General: No acute respiratory distress  Lungs: Clear to auscultation bilaterally, Decreased BS at bases  Cardiovascular: Regular rate and rhythm without murmur gallop or rub normal S1 and S2  Abdomen: Nontender, nondistended, soft, bowel sounds positive, no rebound, no ascites, no appreciable mass  Extremities: No significant cyanosis, clubbing, or edema bilateral lower extremities   Discharge Instructions  Discharge Orders   Future Orders Complete By Expires   Diet Carb Modified  As directed    Increase activity slowly  As directed        Medication List         albuterol 108 (90 BASE) MCG/ACT inhaler  Commonly known as:  PROVENTIL HFA;VENTOLIN HFA  Inhale 2 puffs into the lungs every 4 (four) hours as needed. For wheezing.     aspirin EC 81 MG tablet  Take 81 mg by mouth daily.     calcium acetate 667 MG capsule  Commonly known as:  PHOSLO  Take 2,001-2,668 mg by mouth 3 (three) times daily with meals. Dosage depends on meal consumed     clonazePAM 0.5 MG tablet  Commonly known as:  KLONOPIN  Take 0.5 mg by mouth at bedtime.     diphenhydrAMINE 50 MG tablet  Commonly known as:  BENADRYL  Take 50 mg by mouth at bedtime as needed for sleep.     EXFORGE 10-160 MG per tablet  Generic drug:  amLODipine-valsartan  Take 1 tablet by mouth at bedtime.     famotidine 20 MG tablet  Commonly known as:  PEPCID  Take 20 mg by mouth at bedtime.     gabapentin 100 MG  capsule  Commonly known as:  NEURONTIN  Take 100 mg by mouth daily.     HYDROmorphone 2 MG tablet  Commonly known as:  DILAUDID  Take 1-2 tablets (2-4 mg total) by mouth every 6 (six) hours as needed.     insulin glargine 100 units/mL Soln  Commonly known as:  LANTUS  Inject 5 Units into the skin 2 (two) times daily.     ipratropium 0.03 % nasal spray  Commonly known as:  ATROVENT  Place 2 sprays into the nose every 12 (twelve) hours.     levofloxacin 500 MG tablet  Commonly known as:  LEVAQUIN  Take 1 tablet (500 mg total) by mouth every other day.  Start taking on:  01/19/2013     living well with diabetes book  Misc  Give pt book upon d/c     metoprolol succinate 100 MG 24 hr tablet  Commonly known as:  TOPROL-XL  Take 100 mg by mouth daily. Take with or immediately following a meal.     multivitamin Tabs tablet  Take 1 tablet by mouth daily.     mycophenolate 250 MG capsule  Commonly known as:  CELLCEPT  Take 250 mg by mouth 2 (two) times daily.     NOVOLOG FLEXPEN [place]  Inject 4 Units into the skin 2 (two) times daily. Takes 4 units in am and at night, if pt eats lunch she will take insulin before she eats     pantoprazole 40 MG tablet  Commonly known as:  PROTONIX  Take 40 mg by mouth daily.     predniSONE 5 MG tablet  Commonly known as:  DELTASONE  Take 5 mg by mouth daily.     SUMAtriptan 50 MG tablet  Commonly known as:  IMITREX  Take 50 mg by mouth every 2 (two) hours as needed for migraine.     tacrolimus 1 MG capsule  Commonly known as:  PROGRAF  Take 1 mg by mouth 2 (two) times daily.     zolpidem 12.5 MG CR tablet  Commonly known as:  AMBIEN CR  Take 12.5 mg by mouth at bedtime as needed for sleep.       Allergies  Allergen Reactions  . Morphine And Related Nausea And Vomiting and Other (See Comments)    "makes me delerirous"  . Penicillins Anaphylaxis  . Tramadol Nausea And Vomiting  . Depakote [Divalproex Sodium] Other (See Comments)     Pt gets "delirious"  . Vicodin [Hydrocodone-Acetaminophen] Rash       Follow-up Information   Follow up with [name], [name], MD. (in 1-2weeks, call for appt)    Specialty:  Family Medicine   Contact information:   [address] [place] [address] [phone]       Please follow up. (Renal, as directed for dialysis)       Please follow up. (Hand center as scheduled)        The results of significant diagnostics from this hospitalization (including imaging, microbiology, ancillary and laboratory) are listed below for reference.    Significant Diagnostic Studies: Dg Chest 2 View  01/14/2013   *RADIOLOGY REPORT*  Clinical Data: Fever and cough  CHEST - 2 VIEW  Comparison:  January 02, 2013  Findings:  There is a small area of infiltrate in the posterior lateral left base.  Lungs are otherwise clear.  Heart is upper normal in size with normal pulmonary vascularity.  No adenopathy. No bone lesions.  IMPRESSION: Small of infiltrate posterior lateral left base.   Original Report Authenticated By: Bretta Bang, M.D.   Dg Chest 2 View  01/02/2013   *RADIOLOGY REPORT*  Clinical Data: Repair of the pseudoaneurysm of the right benign  CHEST - 2 VIEW  Comparison: November 18, 2012  Findings: There is no focal infiltrate, pulmonary edema, or pleural effusion.  The mediastinal contour and cardiac silhouette are stable.  The soft tissues and osseous structures are stable.  IMPRESSION: No acute cardiopulmonary disease identified.   Original Report Authenticated By: Sherian Rein, M.D.   Ct Head Wo Contrast  01/04/2013   *RADIOLOGY REPORT*  Clinical Data: Hypoglycemia.  CT HEAD WITHOUT CONTRAST  Technique:  Contiguous axial images were obtained from the base of the skull through the vertex without contrast.  Comparison: Head CT 08/28/2011  Findings: Physiologic calcifications in the basal ganglia bilaterally.  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  Patchy areas of decreased attenuation throughout the deep and periventricular white matter of the cerebral hemispheres bilaterally, suggest chronic microvascular ischemic disease. No acute intracranial abnormalities. Specifically, no evidence of acute intracranial hemorrhage, no definite findings of acute/subacute cerebral ischemia, no mass, mass effect, hydrocephalus or abnormal intra or extra-axial fluid collections.  Visualized paranasal sinuses and mastoids are well pneumatized.  No acute displaced skull fractures are identified.  IMPRESSION: 1.  No acute intracranial abnormalities. 2.  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   Original Report Authenticated By: Trudie Reed, M.D.    Microbiology: Recent Results (from the past 240 hour(s))  MRSA PCR SCREENING     Status: None   Collection Time    01/12/13  6:50 PM      Result Value Range Status   MRSA by PCR NEGATIVE  NEGATIVE Final   Comment:            The GeneXpert MRSA Assay (FDA     approved for NASAL specimens     only), is one component of a     comprehensive MRSA colonization     surveillance program. It is not     intended to diagnose MRSA     infection nor to guide or     monitor treatment for     MRSA infections.  CULTURE, BLOOD (ROUTINE X 2)     Status: None   Collection Time    01/13/13  8:40 PM      Result Value Range Status   Specimen Description BLOOD RIGHT HAND   Final   Special Requests BOTTLES DRAWN AEROBIC AND ANAEROBIC 10CC   Final   Culture  Setup Time     Final   Value: 01/14/2013 01:30     Performed at [facility]   Culture     Final   Value:        BLOOD CULTURE RECEIVED NO GROWTH TO DATE CULTURE WILL BE HELD FOR 5 DAYS BEFORE ISSUING A FINAL NEGATIVE REPORT     Performed at [facility]   Report Status PENDING    Incomplete  CULTURE, BLOOD (ROUTINE X 2)     Status: None   Collection Time    01/13/13  8:40 PM      Result Value Range Status   Specimen Description BLOOD LEFT HAND   Final   Special Requests BOTTLES DRAWN AEROBIC AND ANAEROBIC 10CC   Final   Culture  Setup Time     Final   Value: 01/14/2013 01:30     Performed at [facility]   Culture     Final   Value:        BLOOD CULTURE RECEIVED NO GROWTH TO DATE CULTURE WILL BE HELD FOR 5 DAYS BEFORE ISSUING A FINAL NEGATIVE REPORT     Performed at [facility]   Report Status PENDING   Incomplete     Labs: Basic Metabolic Panel:  Recent Labs Lab 01/12/13 1510 01/12/13 1538 01/12/13 1915 01/13/13 1142 01/14/13 1031 01/15/13 0712 01/17/13 0500  NA 131* 133*  --  128* 134* 132* 133*  K 3.9 3.9  --  5.5* 5.0 4.2 4.5  CL 94* 99  --  93* 96 95* 96  CO2 24  --   --  22 28 26 27   GLUCOSE 183* 182*  --  92 102* 246* 114*  BUN 39* 42*  --  57* 22 32* 21  CREATININE 8.84* 9.10* 9.25* 10.73* 6.85* 8.86* 7.54*  CALCIUM 10.3  --   --  9.7 8.9 8.8 9.1  PHOS  --   --   --  1.1* 1.7* 2.2*  --    Liver Function Tests:  Recent Labs Lab 01/13/13 1142 01/14/13 1031 01/15/13 0712  ALBUMIN 3.1* 2.9* 2.9*   No results found for this basename: LIPASE, AMYLASE,  in the last 168 hours No results found for this basename: AMMONIA,  in the last 168 hours CBC:  Recent Labs Lab 01/12/13 1510 01/12/13 1538 01/12/13 1915 01/14/13 1031 01/15/13 0712  WBC 6.1  --  5.8 7.7 6.2  NEUTROABS 3.9  --   --   --   --   HGB 11.7* 12.9 11.9* 10.7* 10.0*  HCT 35.2* 38.0 35.0* 32.7* 30.2*  MCV 95.4  --  94.9 96.5 96.2  PLT 151  --  168 163 158   Cardiac Enzymes: No results found for this basename: CKTOTAL, CKMB, CKMBINDEX, TROPONINI,  in the last 168 hours BNP: BNP (last 3 results) No results found for this basename: PROBNP,  in the last 8760 hours CBG:  Recent Labs Lab 01/15/13 2133 01/16/13 0809 01/16/13 1143 01/16/13 1700  01/16/13 2202  GLUCAP 96 83 152* 120* 297*       Signed:  Donavyn Fecher C  [facility] 01/17/2013, 11:02 AM

## 2013-01-17 NOTE — Progress Notes (Signed)
01/17/2013 Pharmacy Protocol : levaquin  Pharmacy to add levquin to vancomycin and aztreonam for PNA.  Pt is ESRD on HD.  Plan: levaquin 750 mg IV x 1 dose, then levaquin 500 mg IV q48. Thanks Herby Abraham, Pharm.D. [phone] 01/17/2013 12:20 AM

## 2013-01-17 NOTE — Progress Notes (Signed)
Subjective:   Feeling fine this AM- jsut slightly lower sugars overnight, pt feels confident at her and family ability to manage at home- levaquin started- per primary thinking that she would go home today after HD- pt OK with that  Objective Filed Vitals:   01/17/13 0717 01/17/13 0740 01/17/13 0745 01/17/13 0800  BP:  135/73 141/73 165/73  Pulse:  96 89 85  Temp:      TempSrc:      Resp:  18 18 16   Height:      Weight: 72.3 kg (159 lb 6.3 oz)     SpO2:       Physical Exam General: Alert and oriented, no acute distress.  Heart: reg rate and rhythm, no murmur Lungs: bilat lower lobe crackles. Respirations unlabored. Abdomen: soft, nontender, + BS x4 Extremities: no edema + pedal pulses Dialysis Access:  R thigh AVG. + bruit and thrill- accessed for HD  Dialysis Orders: TTS @ [facility]  69.5 3 hr 2K 2.5 ca+ Heparin 5000u. 400/AF 1.5 R groin AVG Optiflux 160  hectorol 1 mcg    Assessment/Plan: 1. Hypoglycemia/DM-  CBG low but better.  Lantus 5 u bid, aspart with meals. Cont educating pt and family.  2. ESRD - TTS at [facility]. - HD today via thigh AVG 3. Anemia - hgb 10. darbepoetin given Thursday 4. Secondary hyperparathyroidism - Ca+ 8.8, phos 2.2 - requiring lower binders as appetite has not been good.  5. HTN/volume - BP 132/67 Metop and norvasc, no edema 6. Nutrition - renal diet. poor appetite but improving per pt. Multi vitamin 7. Pneumonia- chest xray 8/20 Small of infiltrate posterior lateral left base, Blood cultures no growth to date. On Vancomycin and aztreonam.    Afebrile. Now levaquin added- would just send out on that 8. Dispo- likely for discharge today  Martia Dalby A   01/17/2013,8:09 AM  LOS: 5 days    Additional Objective Labs: Basic Metabolic Panel:  Recent Labs Lab 01/13/13 1142 01/14/13 1031 01/15/13 0712  NA 128* 134* 132*  K 5.5* 5.0 4.2  CL 93* 96 95*  CO2 22 28 26   GLUCOSE 92 102* 246*  BUN 57* 22 32*  CREATININE 10.73* 6.85* 8.86*   CALCIUM 9.7 8.9 8.8  PHOS 1.1* 1.7* 2.2*   Liver Function Tests:  Recent Labs Lab 01/13/13 1142 01/14/13 1031 01/15/13 0712  ALBUMIN 3.1* 2.9* 2.9*   No results found for this basename: LIPASE, AMYLASE,  in the last 168 hours CBC:  Recent Labs Lab 01/12/13 1510  01/12/13 1915 01/14/13 1031 01/15/13 0712  WBC 6.1  --  5.8 7.7 6.2  NEUTROABS 3.9  --   --   --   --   HGB 11.7*  < > 11.9* 10.7* 10.0*  HCT 35.2*  < > 35.0* 32.7* 30.2*  MCV 95.4  --  94.9 96.5 96.2  PLT 151  --  168 163 158  < > = values in this interval not displayed. Blood Culture    Component Value Date/Time   SDES BLOOD RIGHT HAND 01/13/2013 2040   SDES BLOOD LEFT HAND 01/13/2013 2040   SPECREQUEST BOTTLES DRAWN AEROBIC AND ANAEROBIC 10CC 01/13/2013 2040   SPECREQUEST BOTTLES DRAWN AEROBIC AND ANAEROBIC 10CC 01/13/2013 2040   CULT  Value:        BLOOD CULTURE RECEIVED NO GROWTH TO DATE CULTURE WILL BE HELD FOR 5 DAYS BEFORE ISSUING A FINAL NEGATIVE REPORT Performed at [facility] 01/13/2013 2040   CULT  Value:  BLOOD CULTURE RECEIVED NO GROWTH TO DATE CULTURE WILL BE HELD FOR 5 DAYS BEFORE ISSUING A FINAL NEGATIVE REPORT Performed at [facility] 01/13/2013 2040   REPTSTATUS PENDING 01/13/2013 2040   REPTSTATUS PENDING 01/13/2013 2040    Cardiac Enzymes: No results found for this basename: CKTOTAL, CKMB, CKMBINDEX, TROPONINI,  in the last 168 hours CBG:  Recent Labs Lab 01/15/13 2133 01/16/13 0809 01/16/13 1143 01/16/13 1700 01/16/13 2202  GLUCAP 96 83 152* 120* 297*   Iron Studies: No results found for this basename: IRON, TIBC, TRANSFERRIN, FERRITIN,  in the last 72 hours @lablastinr3 @ Studies/Results: No results found. Medications:   . amLODipine  10 mg Oral QHS   And  . irbesartan  150 mg Oral QHS  . aspirin EC  81 mg Oral Daily  . calcium acetate  667 mg Oral TID WC  . darbepoetin (ARANESP) injection - DIALYSIS  60 mcg Intravenous Q Thu-HD  . doxercalciferol  1 mcg  Intravenous Q T,Th,Sa-HD  . famotidine  20 mg Oral QHS  . heparin  5,000 Units Subcutaneous Q8H  . heparin  5,000 Units Dialysis Once in dialysis  . HYDROmorphone      . insulin aspart  0-5 Units Subcutaneous QHS  . insulin aspart  0-9 Units Subcutaneous TID WC  . insulin aspart  4 Units Subcutaneous BID WC  . insulin glargine  5 Units Subcutaneous BID  . [START ON 01/19/2013] levofloxacin (LEVAQUIN) IV  500 mg Intravenous Q48H  . living well with diabetes book   Does not apply Once  . metoprolol succinate  100 mg Oral Daily  . multivitamin  1 tablet Oral QHS  . mycophenolate  250 mg Oral BID  . ondansetron      . pantoprazole  40 mg Oral Daily  . pentafluoroprop-tetrafluoroeth      . predniSONE  5 mg Oral Q breakfast  . sodium chloride  3 mL Intravenous Q12H  . tacrolimus  1 mg Oral BID  . vancomycin  750 mg Intravenous Q T,Th,Sa-HD

## 2013-01-17 NOTE — Procedures (Signed)
Patient was seen on dialysis and the procedure was supervised.  BFR 375  Via AVG BP is  135/73.   Patient appears to be tolerating treatment well  Aracelia Brinson A 01/17/2013

## 2013-01-19 LAB — GLUCOSE, CAPILLARY

## 2013-01-20 LAB — CULTURE, BLOOD (ROUTINE X 2): Culture: NO GROWTH

## 2013-02-11 NOTE — Progress Notes (Signed)
This encounter was created in error - please disregard.

## 2013-02-17 ENCOUNTER — Emergency Department (HOSPITAL_COMMUNITY): Payer: Medicare Other

## 2013-02-17 ENCOUNTER — Encounter (HOSPITAL_COMMUNITY): Payer: Self-pay | Admitting: *Deleted

## 2013-02-17 ENCOUNTER — Inpatient Hospital Stay (HOSPITAL_COMMUNITY)
Admission: EM | Admit: 2013-02-17 | Discharge: 2013-02-20 | DRG: 637 | Disposition: A | Discharge status: Home / Self Care | Payer: Medicare Other | Attending: Internal Medicine | Admitting: Internal Medicine

## 2013-02-17 DIAGNOSIS — E1039 Type 1 diabetes mellitus with other diabetic ophthalmic complication: Secondary | ICD-10-CM | POA: Diagnosis present

## 2013-02-17 DIAGNOSIS — Z992 Dependence on renal dialysis: Secondary | ICD-10-CM

## 2013-02-17 DIAGNOSIS — Z9119 Patient's noncompliance with other medical treatment and regimen: Secondary | ICD-10-CM

## 2013-02-17 DIAGNOSIS — L819 Disorder of pigmentation, unspecified: Secondary | ICD-10-CM

## 2013-02-17 DIAGNOSIS — R05 Cough: Secondary | ICD-10-CM

## 2013-02-17 DIAGNOSIS — Z0000 Encounter for general adult medical examination without abnormal findings: Secondary | ICD-10-CM

## 2013-02-17 DIAGNOSIS — M79609 Pain in unspecified limb: Secondary | ICD-10-CM

## 2013-02-17 DIAGNOSIS — K219 Gastro-esophageal reflux disease without esophagitis: Secondary | ICD-10-CM | POA: Diagnosis present

## 2013-02-17 DIAGNOSIS — F3289 Other specified depressive episodes: Secondary | ICD-10-CM | POA: Diagnosis present

## 2013-02-17 DIAGNOSIS — N2581 Secondary hyperparathyroidism of renal origin: Secondary | ICD-10-CM | POA: Diagnosis present

## 2013-02-17 DIAGNOSIS — E669 Obesity, unspecified: Secondary | ICD-10-CM

## 2013-02-17 DIAGNOSIS — E875 Hyperkalemia: Secondary | ICD-10-CM

## 2013-02-17 DIAGNOSIS — E1010 Type 1 diabetes mellitus with ketoacidosis without coma: Principal | ICD-10-CM | POA: Diagnosis present

## 2013-02-17 DIAGNOSIS — Z794 Long term (current) use of insulin: Secondary | ICD-10-CM

## 2013-02-17 DIAGNOSIS — R209 Unspecified disturbances of skin sensation: Secondary | ICD-10-CM

## 2013-02-17 DIAGNOSIS — E1110 Type 2 diabetes mellitus with ketoacidosis without coma: Secondary | ICD-10-CM | POA: Diagnosis present

## 2013-02-17 DIAGNOSIS — N186 End stage renal disease: Secondary | ICD-10-CM | POA: Diagnosis present

## 2013-02-17 DIAGNOSIS — E870 Hyperosmolality and hypernatremia: Secondary | ICD-10-CM

## 2013-02-17 DIAGNOSIS — R509 Fever, unspecified: Secondary | ICD-10-CM | POA: Diagnosis present

## 2013-02-17 DIAGNOSIS — R109 Unspecified abdominal pain: Secondary | ICD-10-CM | POA: Diagnosis present

## 2013-02-17 DIAGNOSIS — E162 Hypoglycemia, unspecified: Secondary | ICD-10-CM

## 2013-02-17 DIAGNOSIS — J40 Bronchitis, not specified as acute or chronic: Secondary | ICD-10-CM | POA: Diagnosis present

## 2013-02-17 DIAGNOSIS — E785 Hyperlipidemia, unspecified: Secondary | ICD-10-CM | POA: Diagnosis present

## 2013-02-17 DIAGNOSIS — F32A Depression, unspecified: Secondary | ICD-10-CM | POA: Diagnosis present

## 2013-02-17 DIAGNOSIS — Z940 Kidney transplant status: Secondary | ICD-10-CM | POA: Diagnosis present

## 2013-02-17 DIAGNOSIS — D631 Anemia in chronic kidney disease: Secondary | ICD-10-CM | POA: Diagnosis present

## 2013-02-17 DIAGNOSIS — G2581 Restless legs syndrome: Secondary | ICD-10-CM | POA: Diagnosis present

## 2013-02-17 DIAGNOSIS — L659 Nonscarring hair loss, unspecified: Secondary | ICD-10-CM

## 2013-02-17 DIAGNOSIS — Z91199 Patient's noncompliance with other medical treatment and regimen due to unspecified reason: Secondary | ICD-10-CM

## 2013-02-17 DIAGNOSIS — E1029 Type 1 diabetes mellitus with other diabetic kidney complication: Secondary | ICD-10-CM | POA: Diagnosis present

## 2013-02-17 DIAGNOSIS — E11319 Type 2 diabetes mellitus with unspecified diabetic retinopathy without macular edema: Secondary | ICD-10-CM | POA: Diagnosis present

## 2013-02-17 DIAGNOSIS — F329 Major depressive disorder, single episode, unspecified: Secondary | ICD-10-CM | POA: Diagnosis present

## 2013-02-17 DIAGNOSIS — T82898A Other specified complication of vascular prosthetic devices, implants and grafts, initial encounter: Secondary | ICD-10-CM

## 2013-02-17 DIAGNOSIS — I120 Hypertensive chronic kidney disease with stage 5 chronic kidney disease or end stage renal disease: Secondary | ICD-10-CM | POA: Diagnosis present

## 2013-02-17 LAB — BASIC METABOLIC PANEL
BUN: 48 mg/dL — ABNORMAL HIGH (ref 6–23)
BUN: 12 mg/dL (ref 6–23)
CO2: 16 mEq/L — ABNORMAL LOW (ref 19–32)
CO2: 23 mEq/L (ref 19–32)
Calcium: 7.8 mg/dL — ABNORMAL LOW (ref 8.4–10.5)
Chloride: 90 mEq/L — ABNORMAL LOW (ref 96–112)
Chloride: 98 mEq/L (ref 96–112)
Creatinine, Ser: 11.41 mg/dL — ABNORMAL HIGH (ref 0.50–1.10)
Creatinine, Ser: 4.4 mg/dL — ABNORMAL HIGH (ref 0.50–1.10)
GFR calc Af Amer: 4 mL/min — ABNORMAL LOW (ref >90)
GFR calc Af Amer: 4 mL/min — ABNORMAL LOW (ref >90)
GFR calc Af Amer: 13 mL/min — ABNORMAL LOW (ref >90)
GFR calc non Af Amer: 4 mL/min — ABNORMAL LOW (ref >90)
GFR calc non Af Amer: 4 mL/min — ABNORMAL LOW (ref >90)
GFR calc non Af Amer: 12 mL/min — ABNORMAL LOW (ref >90)
Glucose, Bld: 252 mg/dL — ABNORMAL HIGH (ref 70–99)
Potassium: 4.1 mEq/L (ref 3.5–5.1)
Potassium: 5.2 mEq/L — ABNORMAL HIGH (ref 3.5–5.1)
Potassium: 3.7 mEq/L (ref 3.5–5.1)
Sodium: 132 mEq/L — ABNORMAL LOW (ref 135–145)
Sodium: 128 mEq/L — ABNORMAL LOW (ref 135–145)

## 2013-02-17 LAB — CBC WITH DIFFERENTIAL/PLATELET
Basophils Relative: 0 % (ref 0–1)
Eosinophils Absolute: 0.1 10*3/uL (ref 0.0–0.7)
Eosinophils Relative: 2 % (ref 0–5)
Hemoglobin: 9.5 g/dL — ABNORMAL LOW (ref 12.0–15.0)
Lymphs Abs: 0.8 10*3/uL (ref 0.7–4.0)
MCH: 32.3 pg (ref 26.0–34.0)
MCHC: 33.5 g/dL (ref 30.0–36.0)
MCV: 96.6 fL (ref 78.0–100.0)
Monocytes Relative: 3 % (ref 3–12)
Neutrophils Relative %: 81 % — ABNORMAL HIGH (ref 43–77)
Platelets: 181 10*3/uL (ref 150–400)

## 2013-02-17 LAB — POCT I-STAT 3, ART BLOOD GAS (G3+)
O2 Saturation: 79 %
pCO2 arterial: 30 mmHg — ABNORMAL LOW (ref 35.0–45.0)
pO2, Arterial: 43 mmHg — ABNORMAL LOW (ref 80.0–100.0)

## 2013-02-17 LAB — COMPREHENSIVE METABOLIC PANEL
ALT: 27 U/L (ref 0–35)
AST: 35 U/L (ref 0–37)
Albumin: 3.8 g/dL (ref 3.5–5.2)
CO2: 17 mEq/L — ABNORMAL LOW (ref 19–32)
Chloride: 85 mEq/L — ABNORMAL LOW (ref 96–112)
GFR calc non Af Amer: 4 mL/min — ABNORMAL LOW (ref >90)
Sodium: 126 mEq/L — ABNORMAL LOW (ref 135–145)
Total Bilirubin: 0.5 mg/dL (ref 0.3–1.2)

## 2013-02-17 LAB — GLUCOSE, CAPILLARY
Glucose-Capillary: >600 mg/dL (ref 70–99)
Glucose-Capillary: 526 mg/dL — ABNORMAL HIGH (ref 70–99)
Glucose-Capillary: 321 mg/dL — ABNORMAL HIGH (ref 70–99)
Glucose-Capillary: 79 mg/dL (ref 70–99)
Glucose-Capillary: 154 mg/dL — ABNORMAL HIGH (ref 70–99)
Glucose-Capillary: 295 mg/dL — ABNORMAL HIGH (ref 70–99)
Glucose-Capillary: 153 mg/dL — ABNORMAL HIGH (ref 70–99)
Glucose-Capillary: 119 mg/dL — ABNORMAL HIGH (ref 70–99)
Glucose-Capillary: 195 mg/dL — ABNORMAL HIGH (ref 70–99)
Glucose-Capillary: 295 mg/dL — ABNORMAL HIGH (ref 70–99)
Glucose-Capillary: 236 mg/dL — ABNORMAL HIGH (ref 70–99)

## 2013-02-17 LAB — CBC
Hemoglobin: 8.9 g/dL — ABNORMAL LOW (ref 12.0–15.0)
MCH: 32.6 pg (ref 26.0–34.0)
MCHC: 34.1 g/dL (ref 30.0–36.0)
RDW: 15.7 % — ABNORMAL HIGH (ref 11.5–15.5)
WBC: 6.2 10*3/uL (ref 4.0–10.5)

## 2013-02-17 LAB — MRSA PCR SCREENING: MRSA by PCR: NEGATIVE

## 2013-02-17 MED ORDER — VALSARTAN 160 MG PO TABS
160.0000 mg | ORAL_TABLET | Freq: Every day | ORAL | Status: DC
  Administered 2013-02-17 – 2013-02-19 (×3): 160 mg via ORAL
  Filled 2013-02-17 (×5): qty 1

## 2013-02-17 MED ORDER — ASPIRIN EC 81 MG PO TBEC
81.0000 mg | DELAYED_RELEASE_TABLET | Freq: Every day | ORAL | Status: DC
  Administered 2013-02-17 – 2013-02-20 (×4): 81 mg via ORAL
  Filled 2013-02-17 (×4): qty 1

## 2013-02-17 MED ORDER — HYDROMORPHONE HCL PF 1 MG/ML IJ SOLN
1.0000 mg | Freq: Once | INTRAMUSCULAR | Status: AC
  Administered 2013-02-17: 1 mg via INTRAVENOUS
  Filled 2013-02-17: qty 1

## 2013-02-17 MED ORDER — ONDANSETRON HCL 4 MG/2ML IJ SOLN
4.0000 mg | Freq: Four times a day (QID) | INTRAMUSCULAR | Status: DC | PRN
  Administered 2013-02-17 – 2013-02-20 (×7): 4 mg via INTRAVENOUS
  Filled 2013-02-17 (×6): qty 2

## 2013-02-17 MED ORDER — SODIUM CHLORIDE 0.9 % IV BOLUS (SEPSIS)
1000.0000 mL | Freq: Once | INTRAVENOUS | Status: AC
  Administered 2013-02-17: 1000 mL via INTRAVENOUS

## 2013-02-17 MED ORDER — METOCLOPRAMIDE HCL 5 MG/ML IJ SOLN
10.0000 mg | Freq: Once | INTRAMUSCULAR | Status: AC
  Administered 2013-02-17: 10 mg via INTRAVENOUS
  Filled 2013-02-17: qty 2

## 2013-02-17 MED ORDER — ENSURE COMPLETE PO LIQD: 237.0000 mL | ORAL | Status: DC | PRN

## 2013-02-17 MED ORDER — SODIUM CHLORIDE 0.9 % IV SOLN: INTRAVENOUS | Status: DC

## 2013-02-17 MED ORDER — POTASSIUM CHLORIDE 10 MEQ/100ML IV SOLN
10.0000 meq | INTRAVENOUS | Status: AC
  Administered 2013-02-17 (×2): 10 meq via INTRAVENOUS
  Filled 2013-02-17 (×2): qty 100

## 2013-02-17 MED ORDER — DIPHENHYDRAMINE HCL 50 MG/ML IJ SOLN
25.0000 mg | Freq: Once | INTRAMUSCULAR | Status: AC
  Administered 2013-02-17: 25 mg via INTRAVENOUS
  Filled 2013-02-17: qty 1

## 2013-02-17 MED ORDER — RENA-VITE PO TABS
1.0000 | ORAL_TABLET | Freq: Every day | ORAL | Status: DC
  Administered 2013-02-17 – 2013-02-20 (×4): 1 via ORAL
  Filled 2013-02-17 (×4): qty 1

## 2013-02-17 MED ORDER — SODIUM CHLORIDE 0.9 % IV SOLN
INTRAVENOUS | Status: AC
  Administered 2013-02-17: 06:00:00 via INTRAVENOUS

## 2013-02-17 MED ORDER — SODIUM CHLORIDE 0.9 % IV SOLN
INTRAVENOUS | Status: DC
  Administered 2013-02-17: 2.6 [IU]/h via INTRAVENOUS

## 2013-02-17 MED ORDER — CLONAZEPAM 1 MG PO TABS
1.0000 mg | ORAL_TABLET | Freq: Every day | ORAL | Status: DC
  Administered 2013-02-17 – 2013-02-19 (×3): 1 mg via ORAL
  Filled 2013-02-17: qty 1
  Filled 2013-02-17 (×2): qty 2

## 2013-02-17 MED ORDER — DIPHENHYDRAMINE HCL 25 MG PO TABS
50.0000 mg | ORAL_TABLET | Freq: Every evening | ORAL | Status: DC | PRN
  Administered 2013-02-18 – 2013-02-19 (×3): 50 mg via ORAL
  Filled 2013-02-17 (×3): qty 2

## 2013-02-17 MED ORDER — VANCOMYCIN HCL IN DEXTROSE 1-5 GM/200ML-% IV SOLN
1000.0000 mg | Freq: Once | INTRAVENOUS | Status: AC
  Administered 2013-02-17: 1000 mg via INTRAVENOUS
  Filled 2013-02-17: qty 200

## 2013-02-17 MED ORDER — INSULIN REGULAR BOLUS VIA INFUSION
0.0000 [IU] | Freq: Three times a day (TID) | INTRAVENOUS | Status: DC
  Filled 2013-02-17: qty 10

## 2013-02-17 MED ORDER — ONDANSETRON HCL 4 MG/2ML IJ SOLN
INTRAMUSCULAR | Status: AC
  Administered 2013-02-17: 4 mg via INTRAVENOUS
  Filled 2013-02-17: qty 2

## 2013-02-17 MED ORDER — AMLODIPINE BESYLATE-VALSARTAN 10-160 MG PO TABS: 1.0000 | ORAL_TABLET | Freq: Every day | ORAL | Status: DC

## 2013-02-17 MED ORDER — ALBUTEROL SULFATE HFA 108 (90 BASE) MCG/ACT IN AERS
2.0000 | INHALATION_SPRAY | RESPIRATORY_TRACT | Status: DC | PRN
  Filled 2013-02-17: qty 6.7

## 2013-02-17 MED ORDER — AMLODIPINE BESYLATE 10 MG PO TABS
10.0000 mg | ORAL_TABLET | Freq: Every day | ORAL | Status: DC
  Administered 2013-02-17 – 2013-02-19 (×3): 10 mg via ORAL
  Filled 2013-02-17 (×5): qty 1

## 2013-02-17 MED ORDER — DEXTROSE 50 % IV SOLN: 25.0000 mL | INTRAVENOUS | Status: DC | PRN

## 2013-02-17 MED ORDER — DM-GUAIFENESIN ER 30-600 MG PO TB12
1.0000 | ORAL_TABLET | Freq: Two times a day (BID) | ORAL | Status: DC
  Administered 2013-02-17 – 2013-02-20 (×6): 1 via ORAL
  Filled 2013-02-17 (×9): qty 1

## 2013-02-17 MED ORDER — FAMOTIDINE 20 MG PO TABS
20.0000 mg | ORAL_TABLET | Freq: Every day | ORAL | Status: DC
  Filled 2013-02-17: qty 1

## 2013-02-17 MED ORDER — CALCIUM ACETATE 667 MG PO CAPS
2001.0000 mg | ORAL_CAPSULE | Freq: Three times a day (TID) | ORAL | Status: DC
  Administered 2013-02-17 (×2): 2668 mg via ORAL
  Administered 2013-02-18 (×3): 2001 mg via ORAL
  Administered 2013-02-19: 2668 mg via ORAL
  Administered 2013-02-19: 2001 mg via ORAL
  Administered 2013-02-19 – 2013-02-20 (×2): 2668 mg via ORAL
  Administered 2013-02-20: 2001 mg via ORAL
  Filled 2013-02-17 (×13): qty 4

## 2013-02-17 MED ORDER — SODIUM CHLORIDE 0.9 % IV SOLN
INTRAVENOUS | Status: DC
  Administered 2013-02-17: 04:00:00 via INTRAVENOUS

## 2013-02-17 MED ORDER — INFLUENZA VAC SPLIT QUAD 0.5 ML IM SUSP
0.5000 mL | INTRAMUSCULAR | Status: AC
  Administered 2013-02-18: 0.5 mL via INTRAMUSCULAR
  Filled 2013-02-17: qty 0.5

## 2013-02-17 MED ORDER — LEVOFLOXACIN IN D5W 500 MG/100ML IV SOLN
500.0000 mg | INTRAVENOUS | Status: DC
  Administered 2013-02-19: 500 mg via INTRAVENOUS
  Filled 2013-02-17 (×2): qty 100

## 2013-02-17 MED ORDER — PENTAFLUOROPROP-TETRAFLUOROETH EX AERO
INHALATION_SPRAY | CUTANEOUS | Status: AC
  Administered 2013-02-17: 15:00:00
  Filled 2013-02-17: qty 103.5

## 2013-02-17 MED ORDER — ZOLPIDEM TARTRATE 5 MG PO TABS
5.0000 mg | ORAL_TABLET | Freq: Every day | ORAL | Status: DC
  Administered 2013-02-17 – 2013-02-19 (×3): 5 mg via ORAL
  Filled 2013-02-17 (×3): qty 1

## 2013-02-17 MED ORDER — DEXTROSE-NACL 5-0.45 % IV SOLN
INTRAVENOUS | Status: DC
  Administered 2013-02-17: 21:00:00 via INTRAVENOUS

## 2013-02-17 MED ORDER — PREDNISONE 5 MG PO TABS
5.0000 mg | ORAL_TABLET | Freq: Every day | ORAL | Status: DC
  Administered 2013-02-17 – 2013-02-20 (×4): 5 mg via ORAL
  Filled 2013-02-17 (×4): qty 1

## 2013-02-17 MED ORDER — BIOTENE DRY MOUTH MT LIQD
15.0000 mL | Freq: Two times a day (BID) | OROMUCOSAL | Status: DC
  Administered 2013-02-17 – 2013-02-20 (×5): 15 mL via OROMUCOSAL

## 2013-02-17 MED ORDER — HEPARIN SODIUM (PORCINE) 5000 UNIT/ML IJ SOLN
5000.0000 [IU] | Freq: Three times a day (TID) | INTRAMUSCULAR | Status: DC
  Filled 2013-02-17 (×4): qty 1

## 2013-02-17 MED ORDER — PANTOPRAZOLE SODIUM 40 MG PO TBEC
40.0000 mg | DELAYED_RELEASE_TABLET | Freq: Every day | ORAL | Status: DC
  Administered 2013-02-17 – 2013-02-20 (×4): 40 mg via ORAL
  Filled 2013-02-17 (×4): qty 1

## 2013-02-17 MED ORDER — DARBEPOETIN ALFA-POLYSORBATE 100 MCG/0.5ML IJ SOLN
100.0000 ug | INTRAMUSCULAR | Status: DC
  Administered 2013-02-17 (×2): 100 ug via INTRAVENOUS
  Filled 2013-02-17: qty 0.5

## 2013-02-17 MED ORDER — TACROLIMUS 1 MG PO CAPS
1.0000 mg | ORAL_CAPSULE | Freq: Every day | ORAL | Status: DC
  Filled 2013-02-17: qty 1

## 2013-02-17 MED ORDER — MYCOPHENOLATE MOFETIL 250 MG PO CAPS
250.0000 mg | ORAL_CAPSULE | Freq: Two times a day (BID) | ORAL | Status: DC
  Administered 2013-02-17: 250 mg via ORAL
  Filled 2013-02-17 (×2): qty 1

## 2013-02-17 MED ORDER — SODIUM CHLORIDE 0.9 % IV SOLN
INTRAVENOUS | Status: DC
  Administered 2013-02-17: 5.4 [IU]/h via INTRAVENOUS
  Filled 2013-02-17: qty 1

## 2013-02-17 MED ORDER — FENTANYL CITRATE 0.05 MG/ML IJ SOLN
12.5000 ug | INTRAMUSCULAR | Status: DC | PRN
  Administered 2013-02-17 (×2): 12.5 ug via INTRAVENOUS
  Filled 2013-02-17 (×2): qty 2

## 2013-02-17 MED ORDER — LEVOFLOXACIN IN D5W 750 MG/150ML IV SOLN
750.0000 mg | Freq: Once | INTRAVENOUS | Status: AC
  Administered 2013-02-17: 750 mg via INTRAVENOUS
  Filled 2013-02-17: qty 150

## 2013-02-17 MED ORDER — MYCOPHENOLATE MOFETIL 250 MG PO CAPS
250.0000 mg | ORAL_CAPSULE | Freq: Every day | ORAL | Status: DC
  Filled 2013-02-17: qty 1

## 2013-02-17 MED ORDER — DARBEPOETIN ALFA-POLYSORBATE 100 MCG/0.5ML IJ SOLN
INTRAMUSCULAR | Status: AC
  Administered 2013-02-17: 100 ug via INTRAVENOUS
  Filled 2013-02-17: qty 0.5

## 2013-02-17 MED ORDER — METOPROLOL SUCCINATE ER 100 MG PO TB24
100.0000 mg | ORAL_TABLET | Freq: Every day | ORAL | Status: DC
  Administered 2013-02-17 – 2013-02-18 (×2): 100 mg via ORAL
  Filled 2013-02-17 (×5): qty 1

## 2013-02-17 MED ORDER — GABAPENTIN 100 MG PO CAPS
100.0000 mg | ORAL_CAPSULE | Freq: Every day | ORAL | Status: DC
  Administered 2013-02-17 – 2013-02-20 (×4): 100 mg via ORAL
  Filled 2013-02-17 (×4): qty 1

## 2013-02-17 MED ORDER — TACROLIMUS 1 MG PO CAPS
1.0000 mg | ORAL_CAPSULE | Freq: Two times a day (BID) | ORAL | Status: DC
  Administered 2013-02-17: 1 mg via ORAL
  Filled 2013-02-17 (×2): qty 1

## 2013-02-17 MED ORDER — ACETAMINOPHEN 325 MG PO TABS: 650.0000 mg | ORAL_TABLET | Freq: Four times a day (QID) | ORAL | Status: DC | PRN

## 2013-02-17 MED ORDER — ONDANSETRON HCL 4 MG/2ML IJ SOLN
4.0000 mg | Freq: Once | INTRAMUSCULAR | Status: AC
  Administered 2013-02-17: 4 mg via INTRAVENOUS

## 2013-02-17 NOTE — ED Notes (Signed)
Pt's CBG greater than 600

## 2013-02-17 NOTE — ED Provider Notes (Signed)
CSN: [id number]     Arrival date & time 02/17/13  0029 History   First MD Initiated Contact with Patient 02/17/13 0255     Chief Complaint  Patient presents with  . Chills  . Shortness of Breath  . Hyperglycemia   (Consider location/radiation/quality/duration/timing/severity/associated sxs/prior Treatment) Patient is a 42 y.o. female presenting with hyperglycemia. The history is provided by the patient.  Hyperglycemia Associated symptoms: fever, shortness of breath and vomiting   Associated symptoms: no abdominal pain and no chest pain    Feeling sick for the last few days with fever to 101 tonight. He is insulin-dependent diabetic with end-stage renal disease scheduled for routine dialysis this morning.  She has been coughing and feeling increased shortness of breath. Today her blood sugars have been elevated in the 400s and she also began having nausea and vomiting. Multiple episodes of emesis without blood. No diarrhea. No abdominal pain. No chest pain. Symptoms moderate in severity Past Medical History  Diagnosis Date  . Hyperlipidemia   . Alopecia   . Obesity   . Iron deficiency   . 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 RLS (restless legs syndrome)   . Respiratory failure   . Injury of conjunctiva and corneal abrasion of right eye without foreign body   . Cellulitis   . Dialysis patient     Tues; Thurs; Sat; [place]  . 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 Anemia   . Pneumonia 2012    "double"  . Blood transfusion 2004    "when I had my transplant"  . Migraine   . 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 Hyperparathyroidism, secondary   . Diabetic retinopathy   . Hypertension     takes Metoprolol and Exforge daily  . Depression     takes Klonopin nightly  . Diabetes mellitus type 1     Pt states diagnosed at age 17 with prior episodes  of DKA. S/p pancreatic transplant   . GERD (gastroesophageal reflux disease)     takes Protonix daily  . PONV (postoperative nausea and vomiting)   . Bronchitis   . Migraine   . Asthma   . Shortness of breath   . Emphysema of lung   . DKA (diabetic ketoacidoses) 07/02/2012  . Angina     none in past 2 years.   Past Surgical History  Procedure Laterality Date  . 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 Thyroglobulin      x 7  . Av fistula placement      right upper arm  . Cholecystectomy  1995  .  hd graft placement/removal      "had 2 in my left upper arm"  . Eye surgery    . Retinopathy surgery    . Tooth extraction  10/10/11    X's two  . Insertion of dialysis catheter  12/08/2011    Procedure: INSERTION OF DIALYSIS CATHETER;  Surgeon: Chuck Hint, MD;  Location: [facility] OR;  Service: Vascular;  Laterality: Left;  . Av fistula placement  01/22/2012    Procedure: INSERTION OF ARTERIOVENOUS (AV) GORE-TEX GRAFT ARM;  Surgeon: Chuck Hint, MD;  Location: [facility] OR;  Service: Vascular;  Laterality: Left;  Ultrasound guided.  . Av fistula placement  03/14/2012    Procedure: INSERTION  OF ARTERIOVENOUS (AV) GORE-TEX GRAFT THIGH;  Surgeon: Chuck Hint, MD;  Location: [facility] OR;  Service: Vascular;  Laterality: Right;  . False aneurysm repair Right 01/02/2013    Procedure: Excision of lymphocele in right thigh.;  Surgeon: Chuck Hint, MD;  Location: [facility] OR;  Service: Vascular;  Laterality: Right;   Family History  Problem Relation Age of Onset  . Hypertension Mother   . Kidney disease Mother   . Diabetes Mother   . Hypertension Father   . Kidney disease Father   . Diabetes Father    History  Substance Use Topics  . Smoking status: Never Smoker   . Smokeless tobacco: Never Used  . Alcohol Use: No   OB History   Grav Para Term Preterm Abortions TAB SAB Ect Mult Living                 Review of Systems   Constitutional: Positive for fever and chills.  HENT: Negative for neck pain and neck stiffness.   Eyes: Negative for visual disturbance.  Respiratory: Positive for cough and shortness of breath.   Cardiovascular: Negative for chest pain.  Gastrointestinal: Positive for vomiting. Negative for abdominal pain.  Genitourinary: Negative for flank pain.  Musculoskeletal: Negative for back pain.  Skin: Negative for rash.  Neurological: Negative for headaches.  All other systems reviewed and are negative.    Allergies  Morphine and related; Penicillins; Tramadol; Depakote; and Vicodin  Home Medications   Current Outpatient Rx  Name  Route  Sig  Dispense  Refill  . albuterol (PROVENTIL HFA;VENTOLIN HFA) 108 (90 BASE) MCG/ACT inhaler   Inhalation   Inhale 2 puffs into the lungs every 4 (four) hours as needed. For wheezing.          Marland Kitchen aspirin EC 81 MG tablet   Oral   Take 81 mg by mouth daily.         . calcium acetate (PHOSLO) 667 MG capsule   Oral   Take 2,001-2,668 mg by mouth 3 (three) times daily with meals. Dosage depends on meal consumed         . clonazePAM (KLONOPIN) 0.5 MG tablet   Oral   Take 0.5 mg by mouth at bedtime.          . diphenhydrAMINE (BENADRYL) 50 MG tablet   Oral   Take 50 mg by mouth at bedtime as needed for sleep.         Marland Kitchen EXFORGE 10-160 MG per tablet   Oral   Take 1 tablet by mouth at bedtime.          . famotidine (PEPCID) 20 MG tablet   Oral   Take 20 mg by mouth at bedtime.         . gabapentin (NEURONTIN) 100 MG capsule   Oral   Take 100 mg by mouth daily.         Marland Kitchen HYDROmorphone (DILAUDID) 2 MG tablet   Oral   Take 1-2 tablets (2-4 mg total) by mouth every 6 (six) hours as needed.   30 tablet   0   . Insulin Aspart (NOVOLOG FLEXPEN [place])   Subcutaneous   Inject 4 Units into the skin 2 (two) times daily. Takes 4 units in am and at night, if pt eats lunch she will take insulin before she eats         . insulin  glargine (LANTUS) 100 units/mL SOLN   Subcutaneous   Inject 5 Units  into the skin 2 (two) times daily.         Marland Kitchen ipratropium (ATROVENT) 0.03 % nasal spray   Nasal   Place 2 sprays into the nose every 12 (twelve) hours.         Marland Kitchen levofloxacin (LEVAQUIN) 500 MG tablet   Oral   Take 1 tablet (500 mg total) by mouth every other day.   4 tablet   0   . living well with diabetes book MISC      Give pt book upon d/c         . metoprolol succinate (TOPROL-XL) 100 MG 24 hr tablet   Oral   Take 100 mg by mouth daily. Take with or immediately following a meal.         . multivitamin (RENA-VIT) TABS tablet   Oral   Take 1 tablet by mouth daily.         . mycophenolate (CELLCEPT) 250 MG capsule   Oral   Take 250 mg by mouth 2 (two) times daily.         . pantoprazole (PROTONIX) 40 MG tablet   Oral   Take 40 mg by mouth daily.         . predniSONE (DELTASONE) 5 MG tablet   Oral   Take 5 mg by mouth daily.          . SUMAtriptan (IMITREX) 50 MG tablet   Oral   Take 50 mg by mouth every 2 (two) hours as needed for migraine.         . tacrolimus (PROGRAF) 1 MG capsule   Oral   Take 1 mg by mouth 2 (two) times daily.         Marland Kitchen zolpidem (AMBIEN CR) 12.5 MG CR tablet   Oral   Take 12.5 mg by mouth at bedtime as needed for sleep.          BP 170/78  Pulse 90  Temp(Src) 98.2 F (36.8 C) (Oral)  Resp 14  SpO2 100%  LMP 02/16/2013 Physical Exam  Constitutional: She is oriented to person, place, and time. She appears well-developed and well-nourished.  HENT:  Head: Normocephalic and atraumatic.  Eyes: EOM are normal. Pupils are equal, round, and reactive to light.  Neck: Neck supple.  Cardiovascular: Normal rate, regular rhythm and intact distal pulses.   Pulmonary/Chest: Effort normal. No respiratory distress.  Decreased breath sounds  Abdominal: Soft. She exhibits no distension. There is no tenderness.  Musculoskeletal: Normal range of motion. She  exhibits no tenderness.  Neurological: She is alert and oriented to person, place, and time.  Skin: Skin is warm and dry.    ED Course  Angiocath insertion Date/Time: 02/17/2013 3:11 AM Performed by: Sunnie Nielsen Authorized by: Sunnie Nielsen Consent: Verbal consent obtained. Risks and benefits: risks, benefits and alternatives were discussed Consent given by: patient Patient understanding: patient states understanding of the procedure being performed Patient consent: the patient's understanding of the procedure matches consent given Procedure consent: procedure consent matches procedure scheduled Required items: required blood products, implants, devices, and special equipment available Patient identity confirmed: verbally with patient Preparation: Patient was prepped and draped in the usual sterile fashion. Patient tolerance: Patient tolerated the procedure well with no immediate complications. Comments: 20-gauge right EJ placed without difficulty. Blood draw back and flushed with normal saline. Line secured  Angiocath insertion Date/Time: 02/17/2013 3:13 AM Performed by: Sunnie Nielsen Authorized by: Sunnie Nielsen Consent: Verbal consent obtained. Risks and benefits:  risks, benefits and alternatives were discussed Consent given by: patient Patient understanding: patient states understanding of the procedure being performed Patient consent: the patient's understanding of the procedure matches consent given Procedure consent: procedure consent matches procedure scheduled Required items: required blood products, implants, devices, and special equipment available Patient identity confirmed: verbally with patient Time out: Immediately prior to procedure a "time out" was called to verify the correct patient, procedure, equipment, support staff and site/side marked as required. Patient tolerance: Patient tolerated the procedure well with no immediate complications. Comments: 20-gauge  left-sided EJ placed without difficulty. Blood draw back and flushed with normal saline. Line secured.   (including critical care time) Labs Review Labs Reviewed  COMPREHENSIVE METABOLIC PANEL - Abnormal; Notable for the following:    Sodium 126 (*)    Chloride 85 (*)    CO2 17 (*)    Glucose, Bld 781 (*)    BUN 46 (*)    Creatinine, Ser 11.39 (*)    Calcium 7.8 (*)    Alkaline Phosphatase 119 (*)    GFR calc non Af Amer 4 (*)    GFR calc Af Amer 4 (*)    All other components within normal limits  CBC WITH DIFFERENTIAL - Abnormal; Notable for the following:    RBC 2.94 (*)    Hemoglobin 9.5 (*)    HCT 28.4 (*)    Neutrophils Relative % 81 (*)    All other components within normal limits  GLUCOSE, CAPILLARY - Abnormal; Notable for the following:    Glucose-Capillary >600 (*)    All other components within normal limits  POCT I-STAT 3, BLOOD GAS (G3+) - Abnormal; Notable for the following:    pCO2 arterial 30.0 (*)    pO2, Arterial 43.0 (*)    Bicarbonate 17.7 (*)    Acid-base deficit 6.0 (*)    All other components within normal limits   Imaging Review Dg Chest 2 View  02/17/2013   CLINICAL DATA:  Vomiting and weakness. Headache.  EXAM: CHEST  2 VIEW  COMPARISON:  PA and lateral chest 01/14/2013.  FINDINGS: There is cardiomegaly and vascular congestion. Fairly extensive peribronchial thickening is present. No consolidation is seen. No pneumothorax or pleural fluid is identified.  IMPRESSION: Bronchitic change.  Cardiomegaly and vascular congestion.   Electronically Signed   By: Drusilla Kanner M.D.   On: 02/17/2013 01:57   CRITICAL CARE Performed by: Sunnie Nielsen Total critical care time: 35 Critical care time was exclusive of separately billable procedures and treating other patients. Critical care was necessary to treat or prevent imminent or life-threatening deterioration. Critical care was time spent personally by me on the following activities: development of treatment  plan with patient and/or surrogate as well as nursing, discussions with consultants, evaluation of patient's response to treatment, examination of patient, obtaining history from patient or surrogate, ordering and performing treatments and interventions, ordering and review of laboratory studies, ordering and review of radiographic studies, pulse oximetry and re-evaluation of patient's condition. IV fluids. IV insulin drip. IV antibiotics 3:58 AM discussed with Dr. Izola Price, will admit step down unit MDM  Diagnosis: Hyperglycemia, fever, bronchitis, end-stage renal disease Chest x-ray labs reviewed Poor IV access required EJs IV insulin drip IV fluids MED admit    Sunnie Nielsen, MD 02/17/13 0400

## 2013-02-17 NOTE — Progress Notes (Signed)
Pt c/o migraine HA and [name] Poag NP notified.

## 2013-02-17 NOTE — Procedures (Signed)
Patient was seen on dialysis and the procedure was supervised.  BFR 350  Via AVG BP is  140/57.   Patient appears to be tolerating treatment well  [name] A 02/17/2013

## 2013-02-17 NOTE — ED Notes (Signed)
Pt reports full body pain, chills, cough, shortness of breath, nausea and vomiting. Onset was a week ago. Pt states she is a kidney and pancrease transplant patient. Pt is compliant with medication. Pt is A&Ox4, respirations equal and unlabored, skin warm and dry. Pt also c/o hyperglycemia. Last home blood sugar was 465

## 2013-02-17 NOTE — Progress Notes (Signed)
INITIAL NUTRITION ASSESSMENT  DOCUMENTATION CODES Per approved criteria  -Not Applicable   INTERVENTION: 1.  Modify diet; resume PO diet once medically appropriate 2.  Supplements; Ensure Complete, prn per pt request   NUTRITION DIAGNOSIS: Inadequate oral intake related to omission of energy dense foods as evidenced by NPO/clear liquids.   Monitor:  1.  Food/Beverage; pt meeting >/=90% estimated needs with tolerance. 2.  Wt/wt change; monitor trends  Reason for Assessment: MST  42 y.o. female  Admitting Dx: Abdominal pain  ASSESSMENT: Pt admitted with nausea, vomiting, fever. Also with elevated glucose at home. Pt with ESRD on HD, s/p kidney transplant on immunosuppressants, and DM Type 1.   RD met with pt who denies nutrition-related concerns.  Pt reports her blood sugars are typically "in the 100s."  She reports good control with recent illness.  She reports she follows a diabetes diet, however denies following a renal diet strictly.  She does endorse generally staying away from potatoes or tomatoes.  Per pt, her dialysis center has not recommended diet changes.  She denies nutrition-related concerns or needs, but requests Ensure as she typically drinks them at home when she has trouble meeting her needs. RD to order.  Nutrition Focused Physical Exam:  Subcutaneous Fat:  Orbital Region: WNL Upper Arm Region: WNL Thoracic and Lumbar Region: WNL  Muscle:  Temple Region: WNL Clavicle Bone Region: WNL Clavicle and Acromion Bone Region: WNL Scapular Bone Region: WNL Dorsal Hand: WNL Patellar Region: N/A Anterior Thigh Region: N/A Posterior Calf Region: N/A  Edema: none present   Height: Ht Readings from Last 1 Encounters:  02/17/13 5\' 1"  (1.549 m)    Weight: Wt Readings from Last 1 Encounters:  02/17/13 163 lb 9.3 oz (74.2 kg)    Ideal Body Weight: 105 lbs  % Ideal Body Weight: 155%  Wt Readings from Last 10 Encounters:  02/17/13 163 lb 9.3 oz (74.2 kg)   01/17/13 154 lb 5.2 oz (70 kg)  01/04/13 145 lb (65.772 kg)  01/01/13 153 lb 6.4 oz (69.582 kg)  12/31/12 155 lb (70.308 kg)  12/24/12 160 lb (72.576 kg)  12/17/12 158 lb (71.668 kg)  11/18/12 153 lb 14.1 oz (69.8 kg)  11/03/12 164 lb 12.8 oz (74.753 kg)  07/05/12 150 lb 12.7 oz (68.4 kg)    Usual Body Weight: 155-165 lbs  % Usual Body Weight: 100%  BMI:  Body mass index is 30.92 kg/(m^2).  Estimated Nutritional Needs: Kcal: 2080-2280 Protein: 75-90g Fluid: ~2.2 L/day  Skin: no issues noted  Diet Order: Clear Liquid  EDUCATION NEEDS: -Education needs addressed   Intake/Output Summary (Last 24 hours) at 02/17/13 1108 Last data filed at 02/17/13 0601  Gross per 24 hour  Intake 423.31 ml  Output      0 ml  Net 423.31 ml    Last BM: 9/23  Labs:   Recent Labs Lab 02/17/13 0117 02/17/13 0708  NA 126* 132*  K 4.9 4.1  CL 85* 93*  CO2 17* 19  BUN 46* 47*  CREATININE 11.39* 11.49*  CALCIUM 7.8* 7.8*  GLUCOSE 781* 252*    CBG (last 3)   Recent Labs  02/17/13 0637 02/17/13 0752 02/17/13 0859  GLUCAP 321* 169* 93    Scheduled Meds: . amLODipine  10 mg Oral QHS   And  . valsartan  160 mg Oral QHS  . antiseptic oral rinse  15 mL Mouth Rinse BID  . aspirin EC  81 mg Oral Daily  . calcium acetate  2,001-2,668 mg Oral TID WC  . clonazePAM  1 mg Oral QHS  . dextromethorphan-guaiFENesin  1 tablet Oral BID  . gabapentin  100 mg Oral Daily  . [START ON 02/18/2013] influenza vac split quadrivalent PF  0.5 mL Intramuscular Tomorrow-1000  . [START ON 02/19/2013] levofloxacin (LEVAQUIN) IV  500 mg Intravenous Q48H  . metoprolol succinate  100 mg Oral Q breakfast  . multivitamin  1 tablet Oral Daily  . mycophenolate  250 mg Oral BID  . pantoprazole  40 mg Oral Daily  . predniSONE  5 mg Oral Daily  . tacrolimus  1 mg Oral BID  . zolpidem  5 mg Oral QHS    Continuous Infusions: . sodium chloride 125 mL/hr at 02/17/13 0445  . dextrose 5 % and 0.45% NaCl     . insulin (NOVOLIN-R) infusion 2.6 Units/hr (02/17/13 1914)    Past Medical History  Diagnosis Date  . Hyperlipidemia   . Alopecia   . Obesity   . Iron deficiency   . 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 RLS (restless legs syndrome)   . Respiratory failure   . Injury of conjunctiva and corneal abrasion of right eye without foreign body   . Cellulitis   . Dialysis patient     Tues; Thurs; Sat; [place]  . 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 Anemia   . Pneumonia 2012    "double"  . Blood transfusion 2004    "when I had my transplant"  . Migraine   . 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 Hyperparathyroidism, secondary   . Diabetic retinopathy   . Hypertension     takes Metoprolol and Exforge daily  . Depression     takes Klonopin nightly  . Diabetes mellitus type 1     Pt states diagnosed at age 51 with prior episodes of DKA. S/p pancreatic transplant   . GERD (gastroesophageal reflux disease)     takes Protonix daily  . PONV (postoperative nausea and vomiting)   . Bronchitis   . Migraine   . Asthma   . Shortness of breath   . Emphysema of lung   . DKA (diabetic ketoacidoses) 07/02/2012  . Angina     none in past 2 years.    Past Surgical History  Procedure Laterality Date  . 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 Thyroglobulin      x 7  . Av fistula placement      right upper arm  . Cholecystectomy  1995  .  hd graft placement/removal      "had 2 in my left upper arm"  . Eye surgery    . Retinopathy surgery    . Tooth extraction  10/10/11    X's two  . Insertion of dialysis catheter  12/08/2011    Procedure: INSERTION OF DIALYSIS CATHETER;  Surgeon: Chuck Hint, MD;  Location: [facility] OR;  Service: Vascular;  Laterality: Left;  . Av fistula placement  01/22/2012    Procedure:  INSERTION OF ARTERIOVENOUS (AV) GORE-TEX GRAFT ARM;  Surgeon: Chuck Hint, MD;  Location: [facility] OR;  Service: Vascular;  Laterality: Left;  Ultrasound guided.  . Av fistula placement  03/14/2012    Procedure: INSERTION OF ARTERIOVENOUS (AV) GORE-TEX GRAFT THIGH;  Surgeon: Chuck Hint, MD;  Location: [facility] OR;  Service: Vascular;  Laterality: Right;  . False aneurysm repair Right 01/02/2013    Procedure: Excision of lymphocele in right thigh.;  Surgeon: Chuck Hint, MD;  Location: [facility] OR;  Service: Vascular;  Laterality: Right;    Loyce Dys, MS RD LDN Clinical Inpatient Dietitian Pager: [phone] Weekend/After hours pager: [phone]

## 2013-02-17 NOTE — H&P (Signed)
[facility] History and Physical  [name] ZOX:[id number] DOB: 1970-11-16 DOA: 02/17/2013  Referring physician: ED physician PCP: [name], MD   Chief Complaint: Nausea, vomiting, fever  HPI:  Pt is 42 yo female with ESRD on HD, status post kidney transplant and on immunosuppressants, DM type I, presenting to [facility] ED with main concern of one week duration of fevers, chills, productive ocugh of clear sputum, malaise. She has been checking her sugar levels and this morning found to have CBG in 400's. She started to have abdominal discomfort, nausea nd non bloody vomiting, unable to tolerate any poral intake. She denies recent sicknesses or hospitalizations, no recent sick contacts or exposures, no chest pain. In ED, pt found to have CBG > 800, AG elevated and pt requiring insulin drip. TRH asked to admit to SDU for further evaluation and management of DKA.   Assessment and Plan:  Principal Problem:   Abdominal pain with nausea and vomiting - Possibly related to DKA, vs viral gastroenteritis - will admit to SDU for further evaluation and management - will provide supportive care with IVF, analgesia and antiemetics as need - BMP and CBC in AM Active Problems:   Diabetic keto-acidosis - uncontrolled DM with last A1C in August 2014 > 10 - pr reports medical compliance - will admit to SDU, start IVF and place on insulin drip - check BMP Q2 hours until gap closes, keep NPO for now - supplement electrolytes as indicated    Fever, unspecified - unclear etiology and possibly secondary to principal problem vs bronchitis - will ask for sputum analysis, strep pneumo and urine legionella - blood cultures - ABX Levaquin for now and narrow down as clinically indicated    Bronchitis - ABX as noted above - ordered sputum analysis, urine legionella and strep pneumo    ESRD (end stage renal disease) - will need to notify nephrology team of pt's admission  - she dialyses  TTS   DM (diabetes mellitus), type 1 with renal complications - currently on insulin drip and will plan on transitioning to long acting insulin once AG closed    Hyperlipidemia - check lipid panel   Immunosuppression - continue cellcept, prednisone, prograf     Depression - stable   Anemia of chronic renal failure - Hg and Hct stable with no signs of active bleed - CBC in AM  Code Status: Full Family Communication: Pt at bedside Disposition Plan: Admit to SDU   Review of Systems:  Constitutional: Positive for fever, chills and malaise/fatigue. Negative for diaphoresis.  HENT: Negative for hearing loss, ear pain, nosebleeds, congestion, sore throat, neck pain, tinnitus and ear discharge.   Eyes: Negative for blurred vision, double vision, photophobia, pain, discharge and redness.  Respiratory: Negative for hemoptysis, wheezing and stridor.   Cardiovascular: Negative for chest pain, palpitations, orthopnea, claudication and leg swelling.  Gastrointestinal: Positive for nausea, vomiting and abdominal pain. Negative for heartburn, constipation, blood in stool and melena.  Genitourinary: Negative for dysuria, urgency, frequency, hematuria and flank pain.  Musculoskeletal: Negative for myalgias, back pain, joint pain and falls.  Skin: Negative for itching and rash.  Neurological: Negative for tingling, tremors, sensory change, speech change, focal weakness, loss of consciousness and headaches.  Endo/Heme/Allergies: Negative for environmental allergies and polydipsia. Does not bruise/bleed easily.  Psychiatric/Behavioral: Negative for suicidal ideas. The patient is not nervous/anxious.      Past Medical History  Diagnosis Date  . Hyperlipidemia   . Alopecia   . Obesity   .  Iron deficiency   . 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 RLS (restless legs syndrome)   . Respiratory failure   . Injury of conjunctiva and corneal  abrasion of right eye without foreign body   . Cellulitis   . Dialysis patient     Tues; Thurs; Sat; [place]  . 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 Anemia   . Pneumonia 2012    "double"  . Blood transfusion 2004    "when I had my transplant"  . Migraine   . 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 Hyperparathyroidism, secondary   . Diabetic retinopathy   . Hypertension     takes Metoprolol and Exforge daily  . Depression     takes Klonopin nightly  . Diabetes mellitus type 1     Pt states diagnosed at age 8 with prior episodes of DKA. S/p pancreatic transplant   . GERD (gastroesophageal reflux disease)     takes Protonix daily  . PONV (postoperative nausea and vomiting)   . Bronchitis   . Migraine   . Asthma   . Shortness of breath   . Emphysema of lung   . DKA (diabetic ketoacidoses) 07/02/2012  . Angina     none in past 2 years.    Past Surgical History  Procedure Laterality Date  . 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 Thyroglobulin      x 7  . Av fistula placement      right upper arm  . Cholecystectomy  1995  .  hd graft placement/removal      "had 2 in my left upper arm"  . Eye surgery    . Retinopathy surgery    . Tooth extraction  10/10/11    X's two  . Insertion of dialysis catheter  12/08/2011    Procedure: INSERTION OF DIALYSIS CATHETER;  Surgeon: Chuck Hint, MD;  Location: [facility] OR;  Service: Vascular;  Laterality: Left;  . Av fistula placement  01/22/2012    Procedure: INSERTION OF ARTERIOVENOUS (AV) GORE-TEX GRAFT ARM;  Surgeon: Chuck Hint, MD;  Location: [facility] OR;  Service: Vascular;  Laterality: Left;  Ultrasound guided.  . Av fistula placement  03/14/2012    Procedure: INSERTION OF ARTERIOVENOUS (AV) GORE-TEX GRAFT THIGH;  Surgeon: Chuck Hint, MD;  Location: [facility] OR;  Service: Vascular;  Laterality: Right;  .  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 She has never used smokeless tobacco. She reports that she does not drink alcohol or use illicit drugs.  Allergies  Allergen Reactions  . Morphine And Related Nausea And Vomiting and Other (See Comments)    "makes me delerirous"  . Penicillins Anaphylaxis  . Tramadol Nausea And Vomiting  . Depakote [Divalproex Sodium] Other (See Comments)    Pt gets "delirious"  . Vicodin [Hydrocodone-Acetaminophen] Rash    Family History  Problem Relation Age of Onset  . Hypertension Mother   . Kidney disease Mother   . Diabetes Mother   . Hypertension Father   . Kidney disease Father   . Diabetes Father  Prior to Admission medications   Medication Sig Start Date End Date Taking? Authorizing Provider  albuterol (PROVENTIL HFA;VENTOLIN HFA) 108 (90 BASE) MCG/ACT inhaler Inhale 2 puffs into the lungs every 4 (four) hours as needed. For wheezing.  04/23/11  Yes Nyoka Cowden, MD  aspirin EC 81 MG tablet Take 81 mg by mouth daily.   Yes Historical Provider, MD  calcium acetate (PHOSLO) 667 MG capsule Take 2,001-2,668 mg by mouth 3 (three) times daily with meals. Dosage depends on meal consumed 03/12/11  Yes Historical Provider, MD  clonazePAM (KLONOPIN) 0.5 MG tablet Take 1 mg by mouth at bedtime.    Yes Historical Provider, MD  diphenhydrAMINE (BENADRYL) 50 MG tablet Take 50 mg by mouth at bedtime as needed for allergies.    Yes Historical Provider, MD  EXFORGE 10-160 MG per tablet Take 1 tablet by mouth at bedtime.  06/15/12  Yes Historical Provider, MD  famotidine (PEPCID) 20 MG tablet Take 20 mg by mouth at bedtime.   Yes Historical Provider, MD  gabapentin (NEURONTIN) 100 MG capsule Take 100 mg by mouth daily.   Yes Historical Provider, MD  Insulin Aspart  (NOVOLOG FLEXPEN [place]) Inject 4 Units into the skin 2 (two) times daily. Takes 4 units in am and at night, if pt eats lunch she will take insulin before she eats 01/04/13  Yes Rhae Lerner, MD  insulin glargine (LANTUS) 100 units/mL SOLN Inject 5 Units into the skin 2 (two) times daily. 01/04/13  Yes Rhae Lerner, MD  metoprolol succinate (TOPROL-XL) 100 MG 24 hr tablet Take 100 mg by mouth daily. Take with or immediately following a meal.   Yes Historical Provider, MD  multivitamin (RENA-VIT) TABS tablet Take 1 tablet by mouth daily.   Yes Historical Provider, MD  mycophenolate (CELLCEPT) 250 MG capsule Take 250 mg by mouth 2 (two) times daily.   Yes Historical Provider, MD  pantoprazole (PROTONIX) 40 MG tablet Take 40 mg by mouth daily.   Yes Historical Provider, MD  predniSONE (DELTASONE) 5 MG tablet Take 5 mg by mouth daily.  01/21/11  Yes Historical Provider, MD  tacrolimus (PROGRAF) 1 MG capsule Take 1 mg by mouth 2 (two) times daily.   Yes Historical Provider, MD  zolpidem (AMBIEN CR) 12.5 MG CR tablet Take 12.5 mg by mouth at bedtime as needed for sleep.   Yes Historical Provider, MD    Physical Exam: Filed Vitals:   02/17/13 0104 02/17/13 0222  BP: 152/70 170/78  Pulse: 92 90  Temp: 98.3 F (36.8 C) 98.2 F (36.8 C)  TempSrc: Oral Oral  Resp: 18 14  SpO2: 94% 100%    Physical Exam  Constitutional: Appears well-developed and well-nourished. No distress.  HENT: Normocephalic. External right and left ear normal. Dry MM Eyes: Conjunctivae and EOM are normal. PERRLA, no scleral icterus.  Neck: Normal ROM. Neck supple. No JVD. No tracheal deviation. No thyromegaly.  CVS: RRR, S1/S2 +, no murmurs, no gallops, no carotid bruit.  Pulmonary: Diminished breath sounds bilaterally with crackles at bases   Abdominal: Soft. BS +,  no distension, tenderness in epigastric area,  No rebound or guarding.  Musculoskeletal: Normal range of motion. Lymphadenopathy: No  lymphadenopathy noted, cervical, inguinal. Neuro: Alert. Normal reflexes, muscle tone coordination. No cranial nerve deficit. Skin: Skin is warm and dry. No rash noted. Not diaphoretic. No erythema. No pallor.  Psychiatric: Normal mood and affect. Behavior, judgment, thought content normal.   Labs on Admission:  Basic Metabolic Panel:  Recent Labs Lab 02/17/13 0117  NA 126*  K 4.9  CL 85*  CO2 17*  GLUCOSE 781*  BUN 46*  CREATININE 11.39*  CALCIUM 7.8*   Liver Function Tests:  Recent Labs Lab 02/17/13 0117  AST 35  ALT 27  ALKPHOS 119*  BILITOT 0.5  PROT 6.9  ALBUMIN 3.8   CBC:  Recent Labs Lab 02/17/13 0117  WBC 6.2  NEUTROABS 5.0  HGB 9.5*  HCT 28.4*  MCV 96.6  PLT 181   CBG:  Recent Labs Lab 02/17/13 0120  GLUCAP >600*    Radiological Exams on Admission: Dg Chest 2 View  02/17/2013   CLINICAL DATA:  Vomiting and weakness. Headache.  EXAM: CHEST  2 VIEW  COMPARISON:  PA and lateral chest 01/14/2013.  FINDINGS: There is cardiomegaly and vascular congestion. Fairly extensive peribronchial thickening is present. No consolidation is seen. No pneumothorax or pleural fluid is identified.  IMPRESSION: Bronchitic change.  Cardiomegaly and vascular congestion.   Electronically Signed   By: Drusilla Kanner M.D.   On: 02/17/2013 01:57    EKG: Normal sinus rhythm, no ST/T wave changes  Debbora Presto, MD  [facility] Pager [phone]  If 7PM-7AM, please contact night-coverage [email or web address] Password [facility] 02/17/2013, 4:07 AM

## 2013-02-17 NOTE — ED Notes (Signed)
Nurse First rounds : Nurse explained delay , process and wait time . Respirations unlabored . Denies pain at this time.

## 2013-02-17 NOTE — Consult Note (Signed)
[facility] Renal Consultation Note    Indication for Consultation:  Management of ESRD/hemodialysis; anemia, hypertension/volume and secondary hyperparathyroidism  HPI: [name] is a 42 y.o. 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 HTN.  She presents to the hospital last night with a 2 day history of productive cough, fever and now N/V with inability to keep down PO's and hyperglycemia/DKA.  She was admitted and started on an insulin drip- denies feeling better, still nauseated.  CXR consistent with possible bronchitis started on levaquin.  She is now seen on HD as this is her HD day.   Past Medical History  Diagnosis Date  . Hyperlipidemia   . Alopecia   . Obesity   . Iron deficiency   . 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 RLS (restless legs syndrome)   . Respiratory failure   . Injury of conjunctiva and corneal abrasion of right eye without foreign body   . Cellulitis   . Dialysis patient     Tues; Thurs; Sat; [place]  . 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 Anemia   . Pneumonia 2012    "double"  . Blood transfusion 2004    "when I had my transplant"  . Migraine   . 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 Hyperparathyroidism, secondary   . Diabetic retinopathy   . Hypertension     takes Metoprolol and Exforge daily  . Depression     takes Klonopin nightly  . Diabetes mellitus type 1     Pt states diagnosed at age 47 with prior episodes of DKA. S/p pancreatic transplant   . GERD (gastroesophageal reflux disease)     takes Protonix daily  . PONV (postoperative nausea and vomiting)    . Bronchitis   . Migraine   . Asthma   . Shortness of breath   . Emphysema of lung   . DKA (diabetic ketoacidoses) 07/02/2012  . Angina     none in past 2 years.   Past Surgical History  Procedure Laterality Date  . 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 Thyroglobulin      x 7  . Av fistula placement      right upper arm  . Cholecystectomy  1995  .  hd graft placement/removal      "had 2 in my left upper arm"  . Eye surgery    . Retinopathy surgery    . Tooth extraction  10/10/11    X's two  . Insertion of dialysis catheter  12/08/2011    Procedure: INSERTION OF DIALYSIS CATHETER;  Surgeon: Chuck Hint, MD;  Location: [facility] OR;  Service: Vascular;  Laterality: Left;  . Av fistula placement  01/22/2012    Procedure: INSERTION OF ARTERIOVENOUS (AV) GORE-TEX GRAFT ARM;  Surgeon: Chuck Hint, MD;  Location: [facility] OR;  Service: Vascular;  Laterality: Left;  Ultrasound guided.  . Av fistula placement  03/14/2012    Procedure: INSERTION OF ARTERIOVENOUS (AV) GORE-TEX GRAFT THIGH;  Surgeon: Chuck Hint, MD;  Location: [facility] OR;  Service: Vascular;  Laterality: Right;  .  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 Hypertension Mother   . Kidney disease Mother   . Diabetes Mother   . Hypertension Father   . Kidney disease Father   . Diabetes Father    Social History:  reports that she has never smoked. She has never used smokeless tobacco. She reports that she does not drink alcohol or use illicit drugs. Allergies  Allergen Reactions  . Morphine And Related Nausea And Vomiting and Other (See Comments)    "makes me delerirous"  . Penicillins Anaphylaxis  . Tramadol Nausea And Vomiting  . Depakote [Divalproex Sodium] Other (See  Comments)    Pt gets "delirious"  . Vicodin [Hydrocodone-Acetaminophen] Rash   Prior to Admission medications   Medication Sig Start Date End Date Taking? Authorizing Provider  albuterol (PROVENTIL HFA;VENTOLIN HFA) 108 (90 BASE) MCG/ACT inhaler Inhale 2 puffs into the lungs every 4 (four) hours as needed. For wheezing.  04/23/11  Yes Nyoka Cowden, MD  aspirin EC 81 MG tablet Take 81 mg by mouth daily.   Yes Historical Provider, MD  calcium acetate (PHOSLO) 667 MG capsule Take 2,001-2,668 mg by mouth 3 (three) times daily with meals. Dosage depends on meal consumed 03/12/11  Yes Historical Provider, MD  clonazePAM (KLONOPIN) 0.5 MG tablet Take 1 mg by mouth at bedtime.    Yes Historical Provider, MD  diphenhydrAMINE (BENADRYL) 50 MG tablet Take 50 mg by mouth at bedtime as needed for allergies.    Yes Historical Provider, MD  EXFORGE 10-160 MG per tablet Take 1 tablet by mouth at bedtime.  06/15/12  Yes Historical Provider, MD  famotidine (PEPCID) 20 MG tablet Take 20 mg by mouth at bedtime.   Yes Historical Provider, MD  gabapentin (NEURONTIN) 100 MG capsule Take 100 mg by mouth daily.   Yes Historical Provider, MD  Insulin Aspart (NOVOLOG FLEXPEN [place]) Inject 4 Units into the skin 2 (two) times daily. Takes 4 units in am and at night, if pt eats lunch she will take insulin before she eats 01/04/13  Yes Rhae Lerner, MD  insulin glargine (LANTUS) 100 units/mL SOLN Inject 5 Units into the skin 2 (two) times daily. 01/04/13  Yes Rhae Lerner, MD  metoprolol succinate (TOPROL-XL) 100 MG 24 hr tablet Take 100 mg by mouth daily. Take with or immediately following a meal.   Yes Historical Provider, MD  multivitamin (RENA-VIT) TABS tablet Take 1 tablet by mouth daily.   Yes Historical Provider, MD  mycophenolate (CELLCEPT) 250 MG capsule Take 250 mg by mouth 2 (two) times daily.   Yes Historical Provider, MD  pantoprazole (PROTONIX) 40 MG tablet Take 40 mg by mouth daily.   Yes  Historical Provider, MD  predniSONE (DELTASONE) 5 MG tablet Take 5 mg by mouth daily.  01/21/11  Yes Historical Provider, MD  tacrolimus (PROGRAF) 1 MG capsule Take 1 mg by mouth 2 (two) times daily.   Yes Historical Provider, MD  zolpidem (AMBIEN CR) 12.5 MG CR tablet Take 12.5 mg by mouth at bedtime as needed for sleep.   Yes Historical Provider, MD   Current Facility-Administered Medications  Medication Dose Route Frequency Provider Last Rate Last Dose  . 0.9 %  sodium chloride infusion   Intravenous Continuous Christiane Ha, MD 125 mL/hr  at 02/17/13 0445    . acetaminophen (TYLENOL) tablet 650 mg  650 mg Oral Q6H PRN Christiane Ha, MD      . albuterol (PROVENTIL HFA;VENTOLIN HFA) 108 (90 BASE) MCG/ACT inhaler 2 puff  2 puff Inhalation Q4H PRN Dorothea Ogle, MD      . amLODipine (NORVASC) tablet 10 mg  10 mg Oral QHS Dorothea Ogle, MD       And  . valsartan (DIOVAN) 160 MG tablet 160 mg  160 mg Oral QHS Dorothea Ogle, MD      . antiseptic oral rinse (BIOTENE) solution 15 mL  15 mL Mouth Rinse BID Dorothea Ogle, MD   15 mL at 02/17/13 0836  . aspirin EC tablet 81 mg  81 mg Oral Daily Dorothea Ogle, MD   81 mg at 02/17/13 1051  . calcium acetate (PHOSLO) capsule 2,001-2,668 mg  2,001-2,668 mg Oral TID WC Dorothea Ogle, MD   2,668 mg at 02/17/13 0800  . clonazePAM (KLONOPIN) tablet 1 mg  1 mg Oral QHS Dorothea Ogle, MD      . dextromethorphan-guaiFENesin [facility] DM) 30-600 MG per 12 hr tablet 1 tablet  1 tablet Oral BID Christiane Ha, MD   1 tablet at 02/17/13 1052  . dextrose 5 %-0.45 % sodium chloride infusion   Intravenous Continuous Dorothea Ogle, MD      . dextrose 50 % solution 25 mL  25 mL Intravenous PRN Dorothea Ogle, MD      . diphenhydrAMINE (BENADRYL) tablet 50 mg  50 mg Oral QHS PRN Dorothea Ogle, MD      . feeding supplement (ENSURE COMPLETE) liquid 237 mL  237 mL Oral PRN Ashley Jacobs, RD      . fentaNYL (SUBLIMAZE) injection 12.5 mcg  12.5 mcg Intravenous Q2H  PRN Christiane Ha, MD   12.5 mcg at 02/17/13 1104  . gabapentin (NEURONTIN) capsule 100 mg  100 mg Oral Daily Dorothea Ogle, MD   100 mg at 02/17/13 1053  . [START ON 02/18/2013] influenza vac split quadrivalent PF (FLUARIX) injection 0.5 mL  0.5 mL Intramuscular Tomorrow-1000 Dorothea Ogle, MD      . insulin regular (NOVOLIN R,HUMULIN R) 1 Units/mL in sodium chloride 0.9 % 100 mL infusion   Intravenous Continuous Dorothea Ogle, MD 2.6 mL/hr at 02/17/13 0638 2.6 Units/hr at 02/17/13 8119  . [START ON 02/19/2013] levofloxacin (LEVAQUIN) IVPB 500 mg  500 mg Intravenous Q48H Dorothea Ogle, MD      . metoprolol succinate (TOPROL-XL) 24 hr tablet 100 mg  100 mg Oral Q breakfast Dorothea Ogle, MD   100 mg at 02/17/13 0800  . multivitamin (RENA-VIT) tablet 1 tablet  1 tablet Oral Daily Dorothea Ogle, MD   1 tablet at 02/17/13 1053  . mycophenolate (CELLCEPT) capsule 250 mg  250 mg Oral BID Dorothea Ogle, MD   250 mg at 02/17/13 1054  . ondansetron (ZOFRAN) injection 4 mg  4 mg Intravenous Q6H PRN Christiane Ha, MD   4 mg at 02/17/13 1103  . pantoprazole (PROTONIX) EC tablet 40 mg  40 mg Oral Daily Dorothea Ogle, MD   40 mg at 02/17/13 1058  . predniSONE (DELTASONE) tablet 5 mg  5 mg Oral Daily Dorothea Ogle, MD   5 mg at 02/17/13 1055  . tacrolimus (PROGRAF) capsule 1 mg  1 mg Oral BID Dorothea Ogle, MD  1 mg at 02/17/13 1056  . zolpidem (AMBIEN) tablet 5 mg  5 mg Oral QHS Dorothea Ogle, MD       Labs: Basic Metabolic Panel:  Recent Labs Lab 02/17/13 0117 02/17/13 0708 02/17/13 1155  NA 126* 132* 128*  K 4.9 4.1 PENDING  CL 85* 93* 92*  CO2 17* 19 17*  GLUCOSE 781* 252* 201*  BUN 46* 47* 47*  CREATININE 11.39* 11.49* 11.09*  CALCIUM 7.8* 7.8* 7.7*   Liver Function Tests:  Recent Labs Lab 02/17/13 0117  AST 35  ALT 27  ALKPHOS 119*  BILITOT 0.5  PROT 6.9  ALBUMIN 3.8   No results found for this basename: LIPASE, AMYLASE,  in the last 168 hours No results found for this  basename: AMMONIA,  in the last 168 hours CBC:  Recent Labs Lab 02/17/13 0117 02/17/13 0708  WBC 6.2 6.2  NEUTROABS 5.0  --   HGB 9.5* 8.9*  HCT 28.4* 26.1*  MCV 96.6 95.6  PLT 181 179   Cardiac Enzymes: No results found for this basename: CKTOTAL, CKMB, CKMBINDEX, TROPONINI,  in the last 168 hours CBG:  Recent Labs Lab 02/17/13 0441 02/17/13 0533 02/17/13 0637 02/17/13 0752 02/17/13 0859  GLUCAP >600* 526* 321* 169* 93   INR: @labcntip (inr:3)@ Iron Studies: No results found for this basename: IRON, TIBC, TRANSFERRIN, FERRITIN,  in the last 72 hours Studies/Results: Dg Chest 2 View  02/17/2013   CLINICAL DATA:  Vomiting and weakness. Headache.  EXAM: CHEST  2 VIEW  COMPARISON:  PA and lateral chest 01/14/2013.  FINDINGS: There is cardiomegaly and vascular congestion. Fairly extensive peribronchial thickening is present. No consolidation is seen. No pneumothorax or pleural fluid is identified.  IMPRESSION: Bronchitic change.  Cardiomegaly and vascular congestion.   Electronically Signed   By: Drusilla Kanner M.D.   On: 02/17/2013 01:57    ROS:   Physical Exam: Filed Vitals:   02/17/13 0600 02/17/13 0700 02/17/13 0800 02/17/13 1152  BP: 148/73 160/81 174/89 153/78  Pulse:   82 82  Temp:   98.1 F (36.7 C) 97.7 F (36.5 C)  TempSrc:   Oral Oral  Resp: 18 18 27 27   Height:      Weight:      SpO2:   100% 91%     General: Well developed, well nourished, in no acute distress. Head: Normocephalic, atraumatic, sclera non-icteric, mucus membranes are moist Neck: Supple. JVD not elevated. Lungs: Clear bilaterally to auscultation without wheezes, rales, or rhonchi. Breathing is unlabored. Heart: RRR with S1 S2. No murmurs, rubs, or gallops appreciated. Abdomen: Soft, non-tender, non-distended with normoactive bowel sounds. No rebound/guarding. No obvious abdominal masses. M-S:  Strength and tone appear normal for age. Lower extremities:without edema or ischemic  changes, no open wounds  Neuro: Alert and oriented X 3. Moves all extremities spontaneously. Psych:  Responds to questions appropriately with a normal affect. Dialysis Access: right thigh AVG currently accessed  Dialysis Orders: Center: [facility]  on TTS . EDW 69.5 kg HD Bath 2K/2.5Ca  Time 3:45 Heparin 5000. Access Rt thigh AVG BFR 400 DFR A1.5    Hectorol None (stopped 7/24) mcg IV/HD Epogen 8000   Units IV/HD  Venofer  0  Recent Labs:  Hgb 9.7, Tsat 56%, Ph 2.5, PTH 118 on 8/28 > 62.5 on 7/24. Last A1c 11.4 in July  Assessment/Plan: 1. DKA/ Uncontrolled DM - BG 526 on admit, now 93. Last op A1c 11.8 on 12/18/12. On insulin drip off an on  per primary team 2. Abdominal pain/nausea/vomiting - Related to #1 above vs viral gastroenteritis per admit, supportive care 3. Fever/ Bronchitis - On Levaquin. Sputum analysis, urine legionella, strep pneumo pending per admit 4. ESRD -  TTS via AVG, K+4.1 5. Hypertension/volume  - SBPs 140s-170s on amlodipine, valsartan, toprol xl. Up 5 kg by wgts. IVF reduced to [facility] 6. Anemia  - Hgb 8.9 on op Epo 8000 u. Aranesp 100 q Tues ordered. Last Tsat 56%. No IV Fe for now. 7. Metabolic bone disease -  Ca 7.7 (7.9 corrected). Last P 2.5. Last PTH 118 > 62.5 on 7/24. Hectorol 1 mcg d/c'd in July, but will consider resuming if Ca levels do not improve with added Ca bath. Hold phoslo pending solids. 8. Nutrition - Alb 3.8, On clears 9. Hx kidney txp - On cellcept, prednisone, tacrolimus- I did talk to her about this as well as her primary nephrologist.  We are not even sure if she will be a candidate for re transplant.  I think this continued IS could be harming her in the particular case.  We will take it down to one pill daily of cellcept and prograf but keep the small dose of prednisone for now, pt is agreeable.    Claud Kelp, PA-C [facility] Pager [phone] 02/17/2013, 1:19 PM   Patient seen and examined, agree with above note with above modifications.  Patient well known to [place] on TTS dialysis.  She presents with likely a viral syndrome vs bronchitis with significant hyperglycemia/DKA.  I spoke to pt and her primary nephrologist and we have decided to decrease both her cellcept and prograf to one time per day to strengthen her immune system slightly.  Otherwise, she will get HD today to remove volume and get K down.   Annie Sable, MD 02/17/2013

## 2013-02-17 NOTE — Progress Notes (Signed)
Patient admitted after midnight.  Chart reviewed, patient examined.  Repeat basic metabolic panel pending. Blood glucose is still high. Complaining of headache. No vomiting. Due for dialysis today. Have left a voice mail on the dialysis answering machine. Continue insulin drip. Add cough medicine per patient request.  Crista Curb, M.D.

## 2013-02-17 NOTE — Progress Notes (Addendum)
Renal Service Daily Progress Note [facility]  Subjective: Feeling a little better, still coughing some, "breaking up" per pt  Physical Exam:  Blood pressure 127/70, pulse 77, temperature 98.2 F (36.8 C), temperature source Oral, resp. rate 27, height 5\' 1"  (1.549 m), weight 74 kg (163 lb 2.3 oz), last menstrual period 02/16/2013, SpO2 99.00%. Gen: alert, no distress Neck: no JVD  Lungs: clear bilat Heart: RRR, 2/6 SEM LUSB Abd: Soft, non-tender, non-distended Ext: no edema Neuro: alert, nonfocal HD Access: right thigh AVG patent   Dialysis Orders: Center: [facility] on [facility] .  69.5kg    2K/2.5Ca  3:45hrs   Heparin 5000    Rt thigh AVG   400/A1.5  Hectorol None (stopped 7/24)     Epogen 8000    Venofer none Labs: Hgb 9.7, Tsat 56%, Ph 2.5, PTH 62.5   Assessment/Plan:  1. DKA/ Uncontrolled DM - improved, insulin per primary team 2. Abdominal pain/nausea/vomiting - resolved 3. Fever/ acute bronchitis - on levaquin 4. ESRD, cont TTS HD 5. HTN/volume -amlodipine, valsartan, toprol xl, still 5kg up , max UF w HD in am  6. Anemia - Hgb 8.9, Aranesp 100/wk here. Last Tsat 56% 7. Metabolic bone disease - Ca 7.7 (7.9 corrected). Last P 2.5. Last PTH 118 > 62.5 on 7/24. Hectorol 1 mcg d/c'd in July, but will consider resuming if Ca levels do not improve with added Ca bath. Hold phoslo pending solids. 8. Nutrition - Alb 3.8, On clears 9. Hx kidney/panc txp, both failed- on low dose cellcept, prednisone and tacrolimus, in on list (for 7 yrs) for repeat transplantation, [facility] doctors have kept her on low dose immunosuppression, will continue as previous per pt request    Vinson Moselle  MD Pager [phone]    Cell  [phone] 02/18/2013, 10:30 AM    Recent Labs Lab 02/17/13 0708 02/17/13 1420 02/17/13 2114  NA 132* 128* 140  K 4.1 5.2* 3.7  CL 93* 90* 98  CO2 19 16* 23  GLUCOSE 252* 297* 217*  BUN 47* 48* 12  CREATININE 11.49* 11.41* 4.40*  CALCIUM 7.8* 7.8* 9.9     Recent Labs Lab 02/17/13 0117  AST 35  ALT 27  ALKPHOS 119*  BILITOT 0.5  PROT 6.9  ALBUMIN 3.8    Recent Labs Lab 02/17/13 0117 02/17/13 0708  WBC 6.2 6.2  NEUTROABS 5.0  --   HGB 9.5* 8.9*  HCT 28.4* 26.1*  MCV 96.6 95.6  PLT 181 179   . amLODipine  10 mg Oral QHS   And  . valsartan  160 mg Oral QHS  . antiseptic oral rinse  15 mL Mouth Rinse BID  . aspirin EC  81 mg Oral Daily  . calcium acetate  2,001-2,668 mg Oral TID WC  . clonazePAM  1 mg Oral QHS  . darbepoetin (ARANESP) injection - DIALYSIS  100 mcg Intravenous Q Tue-HD  . dextromethorphan-guaiFENesin  1 tablet Oral BID  . gabapentin  100 mg Oral Daily  . influenza vac split quadrivalent PF  0.5 mL Intramuscular Tomorrow-1000  . [START ON 02/19/2013] levofloxacin (LEVAQUIN) IV  500 mg Intravenous Q48H  . metoprolol succinate  100 mg Oral Q breakfast  . multivitamin  1 tablet Oral Daily  . mycophenolate  250 mg Oral Daily  . pantoprazole  40 mg Oral Daily  . predniSONE  5 mg Oral Daily  . tacrolimus  1 mg Oral Daily  . zolpidem  5 mg Oral QHS   .  sodium chloride 125 mL/hr at 02/17/13 0445  . dextrose 5 % and 0.45% NaCl 50 mL/hr at 02/18/13 0307  . insulin (NOVOLIN-R) infusion Stopped (02/18/13 0249)   acetaminophen, albuterol, dextrose, diphenhydrAMINE, feeding supplement, fentaNYL, ondansetron (ZOFRAN) IV

## 2013-02-17 NOTE — Progress Notes (Signed)
Pt on insulin drip with hourly check; all CBGs between 100 and 160; with the last three CBGs are 153; 119; and 129. Result reported to Floor nurse Teresita Madura, RN  For first shift and Wilson Singer during report after pt tx. Pt did not receive any insulin with the four checks. Pt slept most of the tx and woke up at athe end of tx. She is Alert and oriented X4 and conversing with staff.

## 2013-02-18 LAB — BASIC METABOLIC PANEL
BUN: 18 mg/dL (ref 6–23)
BUN: 27 mg/dL — ABNORMAL HIGH (ref 6–23)
CO2: 27 mEq/L (ref 19–32)
CO2: 17 mEq/L — ABNORMAL LOW (ref 19–32)
Calcium: 9.3 mg/dL (ref 8.4–10.5)
Calcium: 8.9 mg/dL (ref 8.4–10.5)
Chloride: 99 mEq/L (ref 96–112)
Chloride: 91 mEq/L — ABNORMAL LOW (ref 96–112)
Creatinine, Ser: 6.11 mg/dL — ABNORMAL HIGH (ref 0.50–1.10)
Creatinine, Ser: 6.76 mg/dL — ABNORMAL HIGH (ref 0.50–1.10)
GFR calc Af Amer: 9 mL/min — ABNORMAL LOW (ref >90)
GFR calc non Af Amer: 8 mL/min — ABNORMAL LOW (ref >90)
GFR calc non Af Amer: 7 mL/min — ABNORMAL LOW (ref >90)
Glucose, Bld: 119 mg/dL — ABNORMAL HIGH (ref 70–99)
Potassium: 3.8 mEq/L (ref 3.5–5.1)
Sodium: 137 mEq/L (ref 135–145)
Sodium: 132 mEq/L — ABNORMAL LOW (ref 135–145)

## 2013-02-18 LAB — GLUCOSE, CAPILLARY
Glucose-Capillary: 130 mg/dL — ABNORMAL HIGH (ref 70–99)
Glucose-Capillary: 199 mg/dL — ABNORMAL HIGH (ref 70–99)
Glucose-Capillary: 47 mg/dL — ABNORMAL LOW (ref 70–99)
Glucose-Capillary: 91 mg/dL (ref 70–99)
Glucose-Capillary: 97 mg/dL (ref 70–99)
Glucose-Capillary: 150 mg/dL — ABNORMAL HIGH (ref 70–99)
Glucose-Capillary: 246 mg/dL — ABNORMAL HIGH (ref 70–99)

## 2013-02-18 MED ORDER — IPRATROPIUM BROMIDE 0.02 % IN SOLN
0.5000 mg | Freq: Four times a day (QID) | RESPIRATORY_TRACT | Status: DC
  Administered 2013-02-18 – 2013-02-20 (×7): 0.5 mg via RESPIRATORY_TRACT
  Filled 2013-02-18 (×8): qty 2.5

## 2013-02-18 MED ORDER — INSULIN GLARGINE 100 UNIT/ML ~~LOC~~ SOLN: 10.0000 [IU] | Freq: Two times a day (BID) | SUBCUTANEOUS | Status: DC

## 2013-02-18 MED ORDER — MYCOPHENOLATE MOFETIL 250 MG PO CAPS
250.0000 mg | ORAL_CAPSULE | Freq: Two times a day (BID) | ORAL | Status: DC
  Administered 2013-02-18 – 2013-02-20 (×4): 250 mg via ORAL
  Filled 2013-02-18 (×7): qty 1

## 2013-02-18 MED ORDER — INSULIN GLARGINE 100 UNIT/ML ~~LOC~~ SOLN
5.0000 [IU] | Freq: Two times a day (BID) | SUBCUTANEOUS | Status: DC
  Administered 2013-02-18: 5 [IU] via SUBCUTANEOUS
  Filled 2013-02-18 (×2): qty 0.05

## 2013-02-18 MED ORDER — ALBUTEROL SULFATE (5 MG/ML) 0.5% IN NEBU
2.5000 mg | INHALATION_SOLUTION | Freq: Four times a day (QID) | RESPIRATORY_TRACT | Status: DC
Start: 2013-02-18 — End: 2013-02-18

## 2013-02-18 MED ORDER — INSULIN ASPART 100 UNIT/ML ~~LOC~~ SOLN
0.0000 [IU] | Freq: Three times a day (TID) | SUBCUTANEOUS | Status: DC
  Administered 2013-02-19: 1 [IU] via SUBCUTANEOUS
  Administered 2013-02-19: 18:00:00 5 [IU] via SUBCUTANEOUS
  Administered 2013-02-20: 09:00:00 2 [IU] via SUBCUTANEOUS

## 2013-02-18 MED ORDER — INSULIN GLARGINE 100 UNITS/ML SOLOSTAR PEN: 5.0000 [IU] | PEN_INJECTOR | Freq: Two times a day (BID) | SUBCUTANEOUS | Status: DC

## 2013-02-18 MED ORDER — HYDROMORPHONE HCL 2 MG PO TABS
2.0000 mg | ORAL_TABLET | ORAL | Status: DC | PRN
  Administered 2013-02-18: 2 mg via ORAL
  Administered 2013-02-19 – 2013-02-20 (×3): 4 mg via ORAL
  Filled 2013-02-18 (×2): qty 2
  Filled 2013-02-18: qty 1

## 2013-02-18 MED ORDER — IPRATROPIUM BROMIDE 0.02 % IN SOLN
0.5000 mg | Freq: Four times a day (QID) | RESPIRATORY_TRACT | Status: DC
Start: 2013-02-18 — End: 2013-02-18

## 2013-02-18 MED ORDER — ALBUTEROL SULFATE (5 MG/ML) 0.5% IN NEBU
2.5000 mg | INHALATION_SOLUTION | Freq: Four times a day (QID) | RESPIRATORY_TRACT | Status: DC
  Administered 2013-02-18 – 2013-02-19 (×3): 2.5 mg via RESPIRATORY_TRACT
  Filled 2013-02-18 (×4): qty 0.5

## 2013-02-18 MED ORDER — TACROLIMUS 1 MG PO CAPS
1.0000 mg | ORAL_CAPSULE | Freq: Two times a day (BID) | ORAL | Status: DC
  Administered 2013-02-18 – 2013-02-20 (×4): 1 mg via ORAL
  Filled 2013-02-18 (×7): qty 1

## 2013-02-18 MED ORDER — INSULIN ASPART 100 UNIT/ML ~~LOC~~ SOLN
16.0000 [IU] | Freq: Once | SUBCUTANEOUS | Status: AC
  Administered 2013-02-18: 16 [IU] via SUBCUTANEOUS

## 2013-02-18 MED ORDER — INSULIN GLARGINE 100 UNIT/ML ~~LOC~~ SOLN
10.0000 [IU] | Freq: Two times a day (BID) | SUBCUTANEOUS | Status: DC
  Administered 2013-02-18 – 2013-02-20 (×3): 10 [IU] via SUBCUTANEOUS
  Filled 2013-02-18 (×5): qty 0.1

## 2013-02-18 NOTE — Progress Notes (Signed)
Hypoglycemic Event  CBG: 47  Treatment: 15 GM carbohydrate snack  Symptoms: Hungry  Follow-up CBG: Time:0345 CBG Result:91  Possible Reasons for Event: Inadequate meal intake  Comments/MD notified:K. Craige Cotta NP    Lillia Corporal  Remember to initiate Hypoglycemia Order Set & complete

## 2013-02-18 NOTE — Progress Notes (Addendum)
[facility] Progress Note [place] TEAM 1 - Stepdown/ICU TEAM   TKEYA STENCIL WUJ:[id number] DOB: 1970/12/05 DOA: 02/17/2013 PCP: [name], MD  Admit HPI / Brief Narrative: 42 yo female with ESRD on HD, status post kidney transplant and on immunosuppressants, DM type I, presenting to [facility] ED with main concern of one week duration of fevers, chills, productive cough of clear sputum, malaise. She had been checking her sugar levels and found CBG in 400's. She started to have abdominal discomfort, nausea nd non bloody vomiting, unable to tolerate any oral intake.   In ED, pt found to have CBG > 800, AG elevated and pt required insulin drip.   Assessment/Plan:  DKA  / uncontrolled diabetes mellitus Quickly resolved w/ HD - resume home insulin regimen and follow CBG for stability, especially in setting of labile control (recent admission for hypoglycemia)  Acute bronchitis Continues to cough and wheeze - feels very SOB when ambulating - attempt to wean O2  End-stage renal disease (Tuesday Thursday Saturday dialysis schedule) Nephrology following - had HD yesterday per usual schedule  Chronic anemia history of failed kidney/pancreas transplant Remains on low-dose immunosuppressive medications   Code Status: FULL Family Communication: Spoke with patient and family at bedside Disposition Plan: Transfer to a medical bed - follow respiratory status - assure her CBGs remained stable - possible discharge 48 hours  Consultants: Nephrology  Procedures: None  Antibiotics: Levaquin 9/22 >>  DVT prophylaxis: SCDs - pt refused sub Q heparin  HPI/Subjective: The patient denies nausea vomiting or abdominal pain.  She continues to feel significant shortness of breath, especially when attempting to ambulate.  She has noticed intermittent wheeze throughout the day.  She denies chest pain.  She does complain of chronic pain and requested her home pain regimen be  initiated.  Objective: Blood pressure 163/81, pulse 92, temperature 98.4 F (36.9 C), temperature source Oral, resp. rate 14, height 5\' 1"  (1.549 m), weight 74 kg (163 lb 2.3 oz), last menstrual period 02/16/2013, SpO2 99.00%.  Intake/Output Summary (Last 24 hours) at 02/18/13 1231 Last data filed at 02/18/13 0600  Gross per 24 hour  Intake 321.59 ml  Output   3900 ml  Net -3578.41 ml   Exam: General: No acute respiratory distress while at rest Lungs: Coarse bibasilar crackles with scattered expiratory wheezing Cardiovascular: Regular rate and rhythm without murmur gallop or rub normal S1 and S2 Abdomen: Nontender, nondistended, soft, bowel sounds positive, no rebound, no ascites, no appreciable mass Extremities: No significant cyanosis, clubbing, or edema bilateral lower extremities  Data Reviewed: Basic Metabolic Panel:  Recent Labs Lab 02/17/13 0117 02/17/13 0708 02/17/13 1420 02/17/13 2114 02/18/13 1015  NA 126* 132* 128* 140 137  K 4.9 4.1 5.2* 3.7 3.8  CL 85* 93* 90* 98 99  CO2 17* 19 16* 23 27  GLUCOSE 781* 252* 297* 217* 119*  BUN 46* 47* 48* 12 18  CREATININE 11.39* 11.49* 11.41* 4.40* 6.11*  CALCIUM 7.8* 7.8* 7.8* 9.9 9.3   Liver Function Tests:  Recent Labs Lab 02/17/13 0117  AST 35  ALT 27  ALKPHOS 119*  BILITOT 0.5  PROT 6.9  ALBUMIN 3.8   CBC:  Recent Labs Lab 02/17/13 0117 02/17/13 0708  WBC 6.2 6.2  NEUTROABS 5.0  --   HGB 9.5* 8.9*  HCT 28.4* 26.1*  MCV 96.6 95.6  PLT 181 179   CBG:  Recent Labs Lab 02/18/13 0350 02/18/13 0507 02/18/13 0652 02/18/13 0823 02/18/13 0929  GLUCAP 91  130* 150* 143* 110*    Recent Results (from the past 240 hour(s))  MRSA PCR SCREENING     Status: None   Collection Time    02/17/13  5:22 AM      Result Value Range Status   MRSA by PCR NEGATIVE  NEGATIVE Final   Comment:            The GeneXpert MRSA Assay (FDA     approved for NASAL specimens     only), is one component of a      comprehensive MRSA colonization     surveillance program. It is not     intended to diagnose MRSA     infection nor to guide or     monitor treatment for     MRSA infections.  CULTURE, BLOOD (ROUTINE X 2)     Status: None   Collection Time    02/17/13  7:00 AM      Result Value Range Status   Specimen Description BLOOD LEFT HAND   Final   Special Requests BOTTLES DRAWN AEROBIC ONLY 10CC   Final   Culture  Setup Time     Final   Value: 02/17/2013 10:51     Performed at [facility]   Culture     Final   Value:        BLOOD CULTURE RECEIVED NO GROWTH TO DATE CULTURE WILL BE HELD FOR 5 DAYS BEFORE ISSUING A FINAL NEGATIVE REPORT     Performed at [facility]   Report Status PENDING   Incomplete  CULTURE, BLOOD (ROUTINE X 2)     Status: None   Collection Time    02/17/13  7:08 AM      Result Value Range Status   Specimen Description BLOOD RIGHT HAND   Final   Special Requests BOTTLES DRAWN AEROBIC AND ANAEROBIC 10CC   Final   Culture  Setup Time     Final   Value: 02/17/2013 10:51     Performed at [facility]   Culture     Final   Value:        BLOOD CULTURE RECEIVED NO GROWTH TO DATE CULTURE WILL BE HELD FOR 5 DAYS BEFORE ISSUING A FINAL NEGATIVE REPORT     Performed at [facility]   Report Status PENDING   Incomplete     Studies:  Recent x-ray studies have been reviewed in detail by the Attending Physician  Scheduled Meds:  Scheduled Meds: . amLODipine  10 mg Oral QHS   And  . valsartan  160 mg Oral QHS  . antiseptic oral rinse  15 mL Mouth Rinse BID  . aspirin EC  81 mg Oral Daily  . calcium acetate  2,001-2,668 mg Oral TID WC  . clonazePAM  1 mg Oral QHS  . darbepoetin (ARANESP) injection - DIALYSIS  100 mcg Intravenous Q Tue-HD  . dextromethorphan-guaiFENesin  1 tablet Oral BID  . gabapentin  100 mg Oral Daily  . influenza vac split quadrivalent PF  0.5 mL Intramuscular Tomorrow-1000  . insulin aspart  0-9 Units Subcutaneous TID  WC  . [START ON 02/19/2013] levofloxacin (LEVAQUIN) IV  500 mg Intravenous Q48H  . metoprolol succinate  100 mg Oral Q breakfast  . multivitamin  1 tablet Oral Daily  . mycophenolate  250 mg Oral BID  . pantoprazole  40 mg Oral Daily  . predniSONE  5 mg Oral Daily  . tacrolimus  1 mg Oral  BID  . zolpidem  5 mg Oral QHS    Time spent on care of this patient: 35 mins   [facility] T  [facility] Office  [phone] Pager - Text Page per Loretha Stapler as per below:  On-Call/Text Page:      [email or web address]      password [email or web address]  If 7PM-7AM, please contact night-coverage [email or web address] Password [email or web address] 02/18/2013, 12:31 PM   LOS: 1 day

## 2013-02-18 NOTE — Progress Notes (Signed)
Utilization Review Completed.[name] T9/24/2014  

## 2013-02-19 ENCOUNTER — Inpatient Hospital Stay (HOSPITAL_COMMUNITY): Payer: Medicare Other

## 2013-02-19 DIAGNOSIS — E1110 Type 2 diabetes mellitus with ketoacidosis without coma: Secondary | ICD-10-CM

## 2013-02-19 DIAGNOSIS — R05 Cough: Secondary | ICD-10-CM

## 2013-02-19 DIAGNOSIS — N186 End stage renal disease: Secondary | ICD-10-CM

## 2013-02-19 DIAGNOSIS — J40 Bronchitis, not specified as acute or chronic: Secondary | ICD-10-CM

## 2013-02-19 LAB — GLUCOSE, CAPILLARY
Glucose-Capillary: 146 mg/dL — ABNORMAL HIGH (ref 70–99)
Glucose-Capillary: 119 mg/dL — ABNORMAL HIGH (ref 70–99)

## 2013-02-19 LAB — BASIC METABOLIC PANEL
BUN: 35 mg/dL — ABNORMAL HIGH (ref 6–23)
Calcium: 8.9 mg/dL (ref 8.4–10.5)
Chloride: 101 mEq/L (ref 96–112)
Creatinine, Ser: 8.27 mg/dL — ABNORMAL HIGH (ref 0.50–1.10)
GFR calc Af Amer: 6 mL/min — ABNORMAL LOW (ref >90)
GFR calc non Af Amer: 5 mL/min — ABNORMAL LOW (ref >90)
Glucose, Bld: 75 mg/dL (ref 70–99)
Potassium: 3.9 mEq/L (ref 3.5–5.1)

## 2013-02-19 MED ORDER — HYDROMORPHONE HCL PF 1 MG/ML IJ SOLN
INTRAMUSCULAR | Status: AC
  Filled 2013-02-19: qty 2

## 2013-02-19 MED ORDER — ONDANSETRON HCL 4 MG/2ML IJ SOLN
INTRAMUSCULAR | Status: AC
  Filled 2013-02-19: qty 2

## 2013-02-19 MED ORDER — ALBUTEROL SULFATE HFA 108 (90 BASE) MCG/ACT IN AERS
2.0000 | INHALATION_SPRAY | RESPIRATORY_TRACT | Status: DC | PRN
  Filled 2013-02-19: qty 6.7

## 2013-02-19 MED ORDER — HYDROMORPHONE HCL 2 MG PO TABS
ORAL_TABLET | ORAL | Status: AC
  Filled 2013-02-19: qty 2

## 2013-02-19 NOTE — Procedures (Signed)
I was present at this dialysis session. I have reviewed the session itself and made appropriate changes.   [name] Moselle  MD Pager [phone]    Cell  [phone] 02/19/2013, 10:20 AM

## 2013-02-19 NOTE — Progress Notes (Signed)
PT Cancellation Note  Patient Details Name: [name] MRN: [id number] DOB: [birth year]   Cancelled Treatment:    Reason Eval/Treat Not Completed: Patient at procedure or test/unavailable;Other (comment) (Pt in HD this AM.  Will attempt to see if time allows. )   [name] 02/19/2013, 2:06 PM  Jake Shark, PT DPT [phone]

## 2013-02-19 NOTE — Progress Notes (Signed)
[facility] Progress Note [place] TEAM 1 - Stepdown/ICU TEAM   [name] XBJ:[id number] DOB: 08/07/1970 DOA: 02/17/2013 PCP: [name], MD  Admit HPI / Brief Narrative: 42 yo female with ESRD on HD, status post kidney transplant and on immunosuppressants, DM type I, presenting to [facility] ED with main concern of one week duration of fevers, chills, productive cough of clear sputum, malaise. She had been checking her sugar levels and found CBG in 400's. She started to have abdominal discomfort, nausea nd non bloody vomiting, unable to tolerate any oral intake.   In ED, pt found to have CBG > 800, AG elevated and pt required insulin drip.   Assessment/Plan:  DKA  / uncontrolled diabetes mellitus Quickly resolved w/ HD - resume home insulin regimen and follow CBG for stability, especially in setting of labile control (recent admission for hypoglycemia)  Acute bronchitis Continues to cough and wheeze - feels very SOB when ambulating - attempt to wean O2- states she was too lightheaded to ambulate this AM- have asked RN to ambulate later today off of O2- cont to encourage increasing ambulation  End-stage renal disease (Tuesday Thursday Saturday dialysis schedule) Nephrology following - had HD yesterday per usual schedule  Chronic anemia history of failed kidney/pancreas transplant Remains on low-dose immunosuppressive medications   Code Status: FULL Family Communication: Spoke with patient and family at bedside Disposition Plan: Transfer to a medical bed - follow respiratory status - assure her CBGs remained stable - possible discharge 48 hours  Consultants: Nephrology  Procedures: None  Antibiotics: Levaquin 9/22 >>  DVT prophylaxis: SCDs - pt refused sub Q heparin  HPI/Subjective: Very light headed this AM- severe cough- feels short of breath due to the cough- has turned up her O2 to 5 L on her own per RN.   Objective: Blood pressure 153/73, pulse  94, temperature 98.6 F (37 C), temperature source Oral, resp. rate 19, height 5\' 1"  (1.549 m), weight 67.6 kg (149 lb 0.5 oz), last menstrual period 02/16/2013, SpO2 97.00%.  Intake/Output Summary (Last 24 hours) at 02/19/13 1908 Last data filed at 02/19/13 1853  Gross per 24 hour  Intake    360 ml  Output   3505 ml  Net  -3145 ml   Exam: General: No acute respiratory distress while at rest Lungs: mild wheezie Cardiovascular: Regular rate and rhythm without murmur gallop or rub normal S1 and S2 Abdomen: Nontender, nondistended, soft, bowel sounds positive, no rebound, no ascites, no appreciable mass Extremities: No significant cyanosis, clubbing, or edema bilateral lower extremities  Data Reviewed: Basic Metabolic Panel:  Recent Labs Lab 02/17/13 1420 02/17/13 2114 02/18/13 1015 02/18/13 1814 02/19/13 0500  NA 128* 140 137 132* 138  K 5.2* 3.7 3.8 4.4 3.9  CL 90* 98 99 91* 101  CO2 16* 23 27 17* 25  GLUCOSE 297* 217* 119* 588* 75  BUN 48* 12 18 27* 35*  CREATININE 11.41* 4.40* 6.11* 6.76* 8.27*  CALCIUM 7.8* 9.9 9.3 8.9 8.9   Liver Function Tests:  Recent Labs Lab 02/17/13 0117  AST 35  ALT 27  ALKPHOS 119*  BILITOT 0.5  PROT 6.9  ALBUMIN 3.8   CBC:  Recent Labs Lab 02/17/13 0117 02/17/13 0708  WBC 6.2 6.2  NEUTROABS 5.0  --   HGB 9.5* 8.9*  HCT 28.4* 26.1*  MCV 96.6 95.6  PLT 181 179   CBG:  Recent Labs Lab 02/18/13 1733 02/18/13 2155 02/19/13 0617 02/19/13 1252 02/19/13 1654  GLUCAP  492* 246* 146* 119* 277*    Recent Results (from the past 240 hour(s))  MRSA PCR SCREENING     Status: None   Collection Time    02/17/13  5:22 AM      Result Value Range Status   MRSA by PCR NEGATIVE  NEGATIVE Final   Comment:            The GeneXpert MRSA Assay (FDA     approved for NASAL specimens     only), is one component of a     comprehensive MRSA colonization     surveillance program. It is not     intended to diagnose MRSA     infection nor  to guide or     monitor treatment for     MRSA infections.  CULTURE, BLOOD (ROUTINE X 2)     Status: None   Collection Time    02/17/13  7:00 AM      Result Value Range Status   Specimen Description BLOOD LEFT HAND   Final   Special Requests BOTTLES DRAWN AEROBIC ONLY 10CC   Final   Culture  Setup Time     Final   Value: 02/17/2013 10:51     Performed at [facility]   Culture     Final   Value:        BLOOD CULTURE RECEIVED NO GROWTH TO DATE CULTURE WILL BE HELD FOR 5 DAYS BEFORE ISSUING A FINAL NEGATIVE REPORT     Performed at [facility]   Report Status PENDING   Incomplete  CULTURE, BLOOD (ROUTINE X 2)     Status: None   Collection Time    02/17/13  7:08 AM      Result Value Range Status   Specimen Description BLOOD RIGHT HAND   Final   Special Requests BOTTLES DRAWN AEROBIC AND ANAEROBIC 10CC   Final   Culture  Setup Time     Final   Value: 02/17/2013 10:51     Performed at [facility]   Culture     Final   Value:        BLOOD CULTURE RECEIVED NO GROWTH TO DATE CULTURE WILL BE HELD FOR 5 DAYS BEFORE ISSUING A FINAL NEGATIVE REPORT     Performed at [facility]   Report Status PENDING   Incomplete     Studies:  Recent x-ray studies have been reviewed in detail by the Attending Physician  Scheduled Meds:  Scheduled Meds: . amLODipine  10 mg Oral QHS   And  . valsartan  160 mg Oral QHS  . antiseptic oral rinse  15 mL Mouth Rinse BID  . aspirin EC  81 mg Oral Daily  . calcium acetate  2,001-2,668 mg Oral TID WC  . clonazePAM  1 mg Oral QHS  . darbepoetin (ARANESP) injection - DIALYSIS  100 mcg Intravenous Q Tue-HD  . dextromethorphan-guaiFENesin  1 tablet Oral BID  . gabapentin  100 mg Oral Daily  . insulin aspart  0-9 Units Subcutaneous TID WC  . insulin glargine  10 Units Subcutaneous BID  . ipratropium  0.5 mg Nebulization QID  . levofloxacin (LEVAQUIN) IV  500 mg Intravenous Q48H  . metoprolol succinate  100 mg Oral Q  breakfast  . multivitamin  1 tablet Oral Daily  . mycophenolate  250 mg Oral BID  . pantoprazole  40 mg Oral Daily  . predniSONE  5 mg Oral Daily  . tacrolimus  1 mg Oral BID  . zolpidem  5 mg Oral QHS    Time spent on care of this patient: 25 mins   Calvert Cantor, MD  [facility] Office  [phone] Pager - Text Page per Loretha Stapler as per below:  On-Call/Text Page:      [email or web address]      password [email or web address]  If 7PM-7AM, please contact night-coverage [email or web address] Password [email or web address] 02/19/2013, 7:08 PM   LOS: 2 days

## 2013-02-19 NOTE — Progress Notes (Signed)
Pt arrived to the unit with diagnosis DKA. Pt alert and oriented x4. Ambulatory. Denies any pain on assessment. On 2L [place]. VS normal. No distress noted. Pt oriented to staff and unit. Will cont to monitor.

## 2013-02-19 NOTE — Progress Notes (Signed)
Renal Service Daily Progress Note [facility]  Subjective: No complaints, on HD  Physical Exam:  Blood pressure 106/54, pulse 73, temperature 98.5 F (36.9 C), temperature source Oral, resp. rate 18, height 5\' 1"  (1.549 m), weight 71.9 kg (158 lb 8.2 oz), last menstrual period 02/16/2013, SpO2 100.00%. Gen: alert, no distress Neck: no JVD  Lungs: clear bilat Heart: RRR, 2/6 SEM LUSB Abd: Soft, non-tender, non-distended Ext: no edema Neuro: alert, nonfocal HD Access: right thigh AVG patent   Dialysis Orders: Center: [facility] on TTS .  69.5kg    2K/2.5Ca  3:45hrs   Heparin 5000    Rt thigh AVG   400/A1.5  Hectorol None (stopped 7/24)     Epogen 8000    Venofer none Labs: Hgb 9.7, Tsat 56%, Ph 2.5, PTH 62.5   Assessment/Plan:  1. DKA/ Uncontrolled DM - improved, insulin per primary team 2. Fever/ acute bronchitis - on levaquin 3. ESRD, cont TTS HD 4. HTN/volume -amlodipine, valsartan, toprol xl, still 5kg up , max UF w HD in am  5. Anemia - Hgb 8.9, Aranesp 100/wk here. Last Tsat 56% 6. Metabolic bone disease - Ca 7.7 (7.9 corrected). Last P 2.5. Last PTH 118 > 62.5 on 7/24. Hectorol 1 mcg d/c'd in July, but will consider resuming if Ca levels do not improve with added Ca bath. Hold phoslo pending solids. 7. Nutrition - Alb 3.8, On clears 8. Hx kidney/panc txp, both failed- on low dose cellcept, prednisone and tacrolimus per [facility], continue 9. Dispo- per primary    Vinson Moselle  MD Pager [phone]    Cell  [phone] 02/19/2013, 10:30 AM    Recent Labs Lab 02/18/13 1015 02/18/13 1814 02/19/13 0500  NA 137 132* 138  K 3.8 4.4 3.9  CL 99 91* 101  CO2 27 17* 25  GLUCOSE 119* 588* 75  BUN 18 27* 35*  CREATININE 6.11* 6.76* 8.27*  CALCIUM 9.3 8.9 8.9    Recent Labs Lab 02/17/13 0117  AST 35  ALT 27  ALKPHOS 119*  BILITOT 0.5  PROT 6.9  ALBUMIN 3.8    Recent Labs Lab 02/17/13 0117 02/17/13 0708  WBC 6.2 6.2  NEUTROABS 5.0  --   HGB 9.5* 8.9*   HCT 28.4* 26.1*  MCV 96.6 95.6  PLT 181 179   . albuterol  2.5 mg Nebulization QID  . amLODipine  10 mg Oral QHS   And  . valsartan  160 mg Oral QHS  . antiseptic oral rinse  15 mL Mouth Rinse BID  . aspirin EC  81 mg Oral Daily  . calcium acetate  2,001-2,668 mg Oral TID WC  . clonazePAM  1 mg Oral QHS  . darbepoetin (ARANESP) injection - DIALYSIS  100 mcg Intravenous Q Tue-HD  . dextromethorphan-guaiFENesin  1 tablet Oral BID  . gabapentin  100 mg Oral Daily  . insulin aspart  0-9 Units Subcutaneous TID WC  . insulin glargine  10 Units Subcutaneous BID  . ipratropium  0.5 mg Nebulization QID  . levofloxacin (LEVAQUIN) IV  500 mg Intravenous Q48H  . metoprolol succinate  100 mg Oral Q breakfast  . multivitamin  1 tablet Oral Daily  . mycophenolate  250 mg Oral BID  . pantoprazole  40 mg Oral Daily  . predniSONE  5 mg Oral Daily  . tacrolimus  1 mg Oral BID  . zolpidem  5 mg Oral QHS     acetaminophen, dextrose, diphenhydrAMINE, feeding supplement, HYDROmorphone, ondansetron (ZOFRAN) IV

## 2013-02-20 LAB — GLUCOSE, CAPILLARY
Glucose-Capillary: 48 mg/dL — ABNORMAL LOW (ref 70–99)
Glucose-Capillary: 73 mg/dL (ref 70–99)

## 2013-02-20 MED ORDER — ALBUTEROL SULFATE HFA 108 (90 BASE) MCG/ACT IN AERS: 2.0000 | INHALATION_SPRAY | RESPIRATORY_TRACT | Status: AC | PRN

## 2013-02-20 MED ORDER — HYDROMORPHONE HCL PF 1 MG/ML IJ SOLN
0.5000 mg | Freq: Once | INTRAMUSCULAR | Status: AC
  Administered 2013-02-20: 0.5 mg via INTRAVENOUS
  Filled 2013-02-20: qty 1

## 2013-02-20 MED ORDER — DM-GUAIFENESIN ER 30-600 MG PO TB12: 1.0000 | ORAL_TABLET | Freq: Two times a day (BID) | ORAL | Status: DC

## 2013-02-20 MED ORDER — ALBUTEROL SULFATE (5 MG/ML) 0.5% IN NEBU
INHALATION_SOLUTION | RESPIRATORY_TRACT | Status: AC
  Administered 2013-02-20: 2.5 mg via RESPIRATORY_TRACT
  Filled 2013-02-20: qty 0.5

## 2013-02-20 MED ORDER — LEVOFLOXACIN 500 MG PO TABS: 500.0000 mg | ORAL_TABLET | Freq: Every day | ORAL | Status: DC

## 2013-02-20 MED ORDER — DEXTROSE 50 % IV SOLN
INTRAVENOUS | Status: AC
  Filled 2013-02-20: qty 50

## 2013-02-20 MED ORDER — ALBUTEROL SULFATE (5 MG/ML) 0.5% IN NEBU
2.5000 mg | INHALATION_SOLUTION | RESPIRATORY_TRACT | Status: DC | PRN
  Administered 2013-02-20 (×2): 2.5 mg via RESPIRATORY_TRACT
  Filled 2013-02-20: qty 0.5

## 2013-02-20 NOTE — Progress Notes (Signed)
02/20/2013 11:59 AM  Hypoglycemic Event  CBG: 40  Treatment: 15 GM carbohydrate snack  Symptoms: None  Follow-up CBG: Time:1208 CBG Result:40  Possible Reasons for Event: Unknown  Comments/MD notified: Dr. York Spaniel notified.    [name]  Remember to initiate Hypoglycemia Order Set & complete

## 2013-02-20 NOTE — Progress Notes (Signed)
02/20/2013 12:41 PM  Hypoglycemic Event  CBG: 48  Treatment: 15 GM carbohydrate snack  Symptoms: None  Follow-up CBG: Time:1250 CBG Result:73  Possible Reasons for Event: Unknown  Comments/MD notified:Dr. Thom Chimes, Earley Favor  Remember to initiate Hypoglycemia Order Set & complete

## 2013-02-20 NOTE — Progress Notes (Signed)
Subjective:  No co, "ready to go" , Hd yesterday on schedule Objective Vital signs in last 24 hours: Filed Vitals:   02/19/13 1307 02/19/13 1700 02/19/13 2127 02/20/13 0507  BP: 133/65 153/73 130/68 155/80  Pulse: 85 94 90 94  Temp:  98.6 F (37 C) 97.7 F (36.5 C) 98.1 F (36.7 C)  TempSrc:   Oral Oral  Resp: 19 19 18 18   Height:      Weight:   67.728 kg (149 lb 5 oz)   SpO2: 100% 97% 98% 95%   Weight change: -2.6 kg (-5 lb 11.7 oz)  Intake/Output Summary (Last 24 hours) at 02/20/13 1610 Last data filed at 02/20/13 0510  Gross per 24 hour  Intake    840 ml  Output   3505 ml  Net  -2665 ml   Labs: Basic Metabolic Panel:  Recent Labs Lab 02/18/13 1015 02/18/13 1814 02/19/13 0500  NA 137 132* 138  K 3.8 4.4 3.9  CL 99 91* 101  CO2 27 17* 25  GLUCOSE 119* 588* 75  BUN 18 27* 35*  CREATININE 6.11* 6.76* 8.27*  CALCIUM 9.3 8.9 8.9   Liver Function Tests:  Recent Labs Lab 02/17/13 0117  AST 35  ALT 27  ALKPHOS 119*  BILITOT 0.5  PROT 6.9  ALBUMIN 3.8  CBC:  Recent Labs Lab 02/17/13 0117 02/17/13 0708  WBC 6.2 6.2  NEUTROABS 5.0  --   HGB 9.5* 8.9*  HCT 28.4* 26.1*  MCV 96.6 95.6  PLT 181 179  CBG:  Recent Labs Lab 02/19/13 1252 02/19/13 1654 02/19/13 2130 02/20/13 0332 02/20/13 0749  GLUCAP 119* 277* 294* 342* 171*   Studies/Results: Dg Chest Port 1 View  02/19/2013   CLINICAL DATA:  Congestion. A infiltrates.  EXAM: PORTABLE CHEST - 1 VIEW  COMPARISON:  PA and lateral chest 02/17/2013 and 12/2012.  FINDINGS: There is cardiomegaly and mild interstitial edema. No pneumothorax or pleural fluid.  IMPRESSION: Cardiomegaly and mild interstitial edema.   Electronically Signed   By: Drusilla Kanner M.D.   On: 02/19/2013 05:58   Medications:   . amLODipine  10 mg Oral QHS   And  . valsartan  160 mg Oral QHS  . antiseptic oral rinse  15 mL Mouth Rinse BID  . aspirin EC  81 mg Oral Daily  . calcium acetate  2,001-2,668 mg Oral TID WC  .  clonazePAM  1 mg Oral QHS  . darbepoetin (ARANESP) injection - DIALYSIS  100 mcg Intravenous Q Tue-HD  . dextromethorphan-guaiFENesin  1 tablet Oral BID  . gabapentin  100 mg Oral Daily  .  HYDROmorphone (DILAUDID) injection  0.5 mg Intravenous Once  . insulin aspart  0-9 Units Subcutaneous TID WC  . insulin glargine  10 Units Subcutaneous BID  . ipratropium  0.5 mg Nebulization QID  . levofloxacin (LEVAQUIN) IV  500 mg Intravenous Q48H  . metoprolol succinate  100 mg Oral Q breakfast  . multivitamin  1 tablet Oral Daily  . mycophenolate  250 mg Oral BID  . pantoprazole  40 mg Oral Daily  . predniSONE  5 mg Oral Daily  . tacrolimus  1 mg Oral BID  . zolpidem  5 mg Oral QHS    Physical Exam: General: Alert , nad , Appropriate Heart: RRR ,no rub or mur Lungs: CTA bilaterally Abdomen: BS pos. , soft, nontender Extremities: Dialysis Access: No pedal  Edema/  Pos bruit R Fem AVGG   Dialysis Orders: Center: [facility] on  TTS .  69.5kg 2K/2.5Ca 3:45hrs Heparin 5000 Rt thigh AVG 400/A1.5  Hectorol None (stopped 7/24) Epogen 8000 Venofer none  Labs: Hgb 9.7, Tsat 56%, Ph 2.5, PTH 62.5   Assessment/Plan:  1. DKA/ Uncontrolled DM - am bs= 171/  improved, insulin per primary team 2. Fever/ acute bronchitis - on levaquin IV ,po antibiotic change per admit 3. ESRD, cont TTS HD on schedule 4. HTN/volume  155/80  Am bp , yesterday on hd symptomatic when  pulled below edw 67.6 (69.5 edw) on amlodipine 10mg  hs valsartan 160 mg hs and toprol 100mg  am,/ if here for am hd only attempt edw with standing wt pre hd. 5. Anemia - Hgb 8.9< 9.5 ?vol related/ Aranesp 100/wk here. Last Tsat 56% Metabolic bone disease - Ca 8.9/ Last P 2.5. Hold phoslo pending solids./ no vit d 6. Nutrition - Alb 3.8, Tolerating solids / renavite  7. Hx kidney/panc txp, both failed- on low dose cellcept, prednisone and tacrolimus per [facility], continue 8. Dispo- per primary   Lenny Pastel, PA-C [facility]  [phone] 02/20/2013,8:22 AM  LOS: 3 days   Patient seen and examined.  Agree with assessment and plan as above.  Improved, for d/c today. Vinson Moselle  MD Pager [phone]    Cell  [phone] 02/20/2013, 11:04 AM

## 2013-02-20 NOTE — Discharge Summary (Addendum)
Physician Discharge Summary  DERETHA ERTLE ZOX:[id number] DOB: [birth year] DOA: 02/17/2013  PCP: [name], MD  Admit date: 02/17/2013 Discharge date: 02/20/2013  Time spent: > 35 minutes  Recommendations for Outpatient Follow-up:   Follow up with PCP in 1-2 week s Follow up with HD as scheduled   Discharge Diagnoses:  Principal Problem:   Abdominal pain Active Problems:   ESRD (end stage renal disease)   DM (diabetes mellitus), type 1 with renal complications   Hyperlipidemia   Depression   Immunosuppression   Anemia of chronic renal failure   Diabetic keto-acidosis   Fever, unspecified   Bronchitis   Discharge Condition: stable   Diet recommendation: DM   Filed Weights   02/19/13 0745 02/19/13 1144 02/19/13 2127  Weight: 71.9 kg (158 lb 8.2 oz) 67.6 kg (149 lb 0.5 oz) 67.728 kg (149 lb 5 oz)    History of present illness:  42 yo female with ESRD on HD, status post kidney transplant and on immunosuppressants, DM type I, presenting to [facility] ED with main concern of one week duration of fevers, chills, productive cough of clear sputum, malaise. She had been checking her sugar levels and found CBG in 400's. She started to have abdominal discomfort, nausea nd non bloody vomiting, unable to tolerate any oral intake.  In ED, pt found to have CBG > 800, AG elevated and pt required insulin drip.   Hospital Course:   1. DKA / uncontrolled diabetes mellitus resolved  -Quickly resolved w/ HD - resume home insulin regimen and follow CBG for stability, especially in setting of labile control (recent admission for hypoglycemia)  -? Non adherence; reemphasized importance of medication and diet adherence   -episode of mild hypoglycemia; no pre meal insulin upon discharge; cont lantus only; outpatient follow up next week with PCP   2. Acute bronchitis; improved  -CXR no infiltrate; cont supportive care, bronchodilators; atx outpatient   3. End-stage renal disease (Tuesday  Thursday Saturday dialysis schedule)  Nephrology following - had HD yesterday per usual schedule   4. 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 Studies not automatically included, echos, thoracentesis, etc; not x-rays)  Consultations:  Nephrology   Discharge Exam: Filed Vitals:   02/20/13 0507  BP: 155/80  Pulse: 94  Temp: 98.1 F (36.7 C)  Resp: 18    General: alert  Cardiovascular: s1, s2 rrr Respiratory: CTA bl   Discharge Instructions  Discharge Orders   Future Orders Complete By Expires   Diet - low sodium heart healthy  As directed    Discharge instructions  As directed    Comments:     Follow up with primary care doctor in 2 weeks   Increase activity slowly  As directed        Medication List         albuterol 108 (90 BASE) MCG/ACT inhaler  Commonly known as:  PROVENTIL HFA;VENTOLIN HFA  Inhale 2 puffs into the lungs every 4 (four) hours as needed. For wheezing.     albuterol 108 (90 BASE) MCG/ACT inhaler  Commonly known as:  PROVENTIL HFA;VENTOLIN HFA  Inhale 2 puffs into the lungs every 4 (four) hours as needed for wheezing or shortness of breath (as per home regimen).     aspirin EC 81 MG tablet  Take 81 mg by mouth daily.     calcium acetate 667 MG capsule  Commonly  known as:  PHOSLO  Take 2,001-2,668 mg by mouth 3 (three) times daily with meals. Dosage depends on meal consumed     clonazePAM 0.5 MG tablet  Commonly known as:  KLONOPIN  Take 1 mg by mouth at bedtime.     dextromethorphan-guaiFENesin 30-600 MG per 12 hr tablet  Commonly known as:  MUCINEX DM  Take 1 tablet by mouth 2 (two) times daily.     diphenhydrAMINE 50 MG tablet  Commonly known as:  BENADRYL  Take 50 mg by mouth at bedtime as needed for allergies.     EXFORGE 10-160 MG per tablet  Generic drug:  amLODipine-valsartan  Take 1 tablet by mouth at bedtime.      famotidine 20 MG tablet  Commonly known as:  PEPCID  Take 20 mg by mouth at bedtime.     gabapentin 100 MG capsule  Commonly known as:  NEURONTIN  Take 100 mg by mouth daily.     insulin glargine 100 units/mL Soln  Commonly known as:  LANTUS  Inject 5 Units into the skin 2 (two) times daily.     levofloxacin 500 MG tablet  Commonly known as:  LEVAQUIN  Take 1 tablet (500 mg total) by mouth daily.     metoprolol succinate 100 MG 24 hr tablet  Commonly known as:  TOPROL-XL  Take 100 mg by mouth daily. Take with or immediately following a meal.     multivitamin Tabs tablet  Take 1 tablet by mouth daily.     mycophenolate 250 MG capsule  Commonly known as:  CELLCEPT  Take 250 mg by mouth 2 (two) times daily.     NOVOLOG FLEXPEN   Inject 4 Units into the skin 2 (two) times daily. Takes 4 units in am and at night, if pt eats lunch she will take insulin before she eats     pantoprazole 40 MG tablet  Commonly known as:  PROTONIX  Take 40 mg by mouth daily.     predniSONE 5 MG tablet  Commonly known as:  DELTASONE  Take 5 mg by mouth daily.     tacrolimus 1 MG capsule  Commonly known as:  PROGRAF  Take 1 mg by mouth 2 (two) times daily.     zolpidem 12.5 MG CR tablet  Commonly known as:  AMBIEN CR  Take 12.5 mg by mouth at bedtime as needed for sleep.       Allergies  Allergen Reactions  . Morphine And Related Nausea And Vomiting and Other (See Comments)    "makes me delerirous"  . Penicillins Anaphylaxis  . Tramadol Nausea And Vomiting  . Depakote [Divalproex Sodium] Other (See Comments)    Pt gets "delirious"  . Vicodin [Hydrocodone-Acetaminophen] Rash       Follow-up Information   Follow up with [name], [name], MD In 2 weeks.   Specialty:  Family Medicine   Contact information:   [address] [place] [address] [phone]        The results of significant diagnostics from this hospitalization (including imaging,  microbiology, ancillary and laboratory) are listed below for reference.    Significant Diagnostic Studies: Dg Chest 2 View  02/17/2013   CLINICAL DATA:  Vomiting and weakness. Headache.  EXAM: CHEST  2 VIEW  COMPARISON:  PA and lateral chest 01/14/2013.  FINDINGS: There is cardiomegaly and vascular congestion. Fairly extensive peribronchial thickening is present. No consolidation is seen. No pneumothorax or pleural fluid is identified.  IMPRESSION: Bronchitic change.  Cardiomegaly and vascular congestion.   Electronically Signed   By: Drusilla Kanner M.D.   On: 02/17/2013 01:57   Dg Chest Port 1 View  02/19/2013   CLINICAL DATA:  Congestion. A infiltrates.  EXAM: PORTABLE CHEST - 1 VIEW  COMPARISON:  PA and lateral chest 02/17/2013 and 12/2012.  FINDINGS: There is cardiomegaly and mild interstitial edema. No pneumothorax or pleural fluid.  IMPRESSION: Cardiomegaly and mild interstitial edema.   Electronically Signed   By: Drusilla Kanner M.D.   On: 02/19/2013 05:58    Microbiology: Recent Results (from the past 240 hour(s))  MRSA PCR SCREENING     Status: None   Collection Time    02/17/13  5:22 AM      Result Value Range Status   MRSA by PCR NEGATIVE  NEGATIVE Final   Comment:            The GeneXpert MRSA Assay (FDA     approved for NASAL specimens     only), is one component of a     comprehensive MRSA colonization     surveillance program. It is not     intended to diagnose MRSA     infection nor to guide or     monitor treatment for     MRSA infections.  CULTURE, BLOOD (ROUTINE X 2)     Status: None   Collection Time    02/17/13  7:00 AM      Result Value Range Status   Specimen Description BLOOD LEFT HAND   Final   Special Requests BOTTLES DRAWN AEROBIC ONLY 10CC   Final   Culture  Setup Time     Final   Value: 02/17/2013 10:51     Performed at [facility]   Culture     Final   Value:        BLOOD CULTURE RECEIVED NO GROWTH TO DATE CULTURE WILL BE HELD FOR 5 DAYS  BEFORE ISSUING A FINAL NEGATIVE REPORT     Performed at [facility]   Report Status PENDING   Incomplete  CULTURE, BLOOD (ROUTINE X 2)     Status: None   Collection Time    02/17/13  7:08 AM      Result Value Range Status   Specimen Description BLOOD RIGHT HAND   Final   Special Requests BOTTLES DRAWN AEROBIC AND ANAEROBIC 10CC   Final   Culture  Setup Time     Final   Value: 02/17/2013 10:51     Performed at [facility]   Culture     Final   Value:        BLOOD CULTURE RECEIVED NO GROWTH TO DATE CULTURE WILL BE HELD FOR 5 DAYS BEFORE ISSUING A FINAL NEGATIVE REPORT     Performed at [facility]   Report Status PENDING   Incomplete     Labs: Basic Metabolic Panel:  Recent Labs Lab 02/17/13 1420 02/17/13 2114 02/18/13 1015 02/18/13 1814 02/19/13 0500  NA 128* 140 137 132* 138  K 5.2* 3.7 3.8 4.4 3.9  CL 90* 98 99 91* 101  CO2 16* 23 27 17* 25  GLUCOSE 297* 217* 119* 588* 75  BUN 48* 12 18 27* 35*  CREATININE 11.41* 4.40* 6.11* 6.76* 8.27*  CALCIUM 7.8* 9.9 9.3 8.9 8.9   Liver Function Tests:  Recent Labs Lab 02/17/13 0117  AST 35  ALT 27  ALKPHOS 119*  BILITOT 0.5  PROT 6.9  ALBUMIN 3.8   No results found for this basename: LIPASE, AMYLASE,  in the last 168 hours No results found for this basename: AMMONIA,  in the last 168 hours CBC:  Recent Labs Lab 02/17/13 0117 02/17/13 0708  WBC 6.2 6.2  NEUTROABS 5.0  --   HGB 9.5* 8.9*  HCT 28.4* 26.1*  MCV 96.6 95.6  PLT 181 179   Cardiac Enzymes: No results found for this basename: CKTOTAL, CKMB, CKMBINDEX, TROPONINI,  in the last 168 hours BNP: BNP (last 3 results) No results found for this basename: PROBNP,  in the last 8760 hours CBG:  Recent Labs Lab 02/19/13 1252 02/19/13 1654 02/19/13 2130 02/20/13 0332 02/20/13 0749  GLUCAP 119* 277* 294* 342* 171*       Signed:  Jonette Mate N  [facility] 02/20/2013, 10:47 AM

## 2013-02-20 NOTE — Progress Notes (Signed)
Hypoglycemic Event  CBG: 40  Treatment: 15 GM carbohydrate snack  Symptoms: None  Follow-up CBG: Time:1228 CBG Result:48  Possible Reasons for Event: Unknown  Comments/MD notified: Dr. York Spaniel notified    [name], [name]  Remember to initiate Hypoglycemia Order Set & complete

## 2013-02-20 NOTE — Progress Notes (Signed)
Patient ambulated for 10 minutes around the unit independently. She maintained room air SATs between 93% and 99%.  Patient states when she has coughing spells, she usees O2 to catch her breath but does not need O2 continuously.

## 2013-02-23 LAB — CULTURE, BLOOD (ROUTINE X 2): Culture: NO GROWTH

## 2013-02-24 ENCOUNTER — Encounter (HOSPITAL_BASED_OUTPATIENT_CLINIC_OR_DEPARTMENT_OTHER): Payer: Self-pay | Admitting: *Deleted

## 2013-02-24 NOTE — Progress Notes (Signed)
Just got out hospital with bronchitis-and blood sugars high-states better Will need istat-dialysis pt with failed kidney transplant

## 2013-02-25 ENCOUNTER — Other Ambulatory Visit: Payer: Self-pay | Admitting: Orthopedic Surgery

## 2013-02-28 IMAGING — CR DG CHEST 2V
2 series · 2 of 2 positions shown · non-contrast
Comparison: 11/21/2010 and earlier.

CLINICAL DATA: 40-year-old female with shortness of breath and
cough.  History of kidney and pancreas transplant.

CHEST - 2 VIEW

[w chest pa]
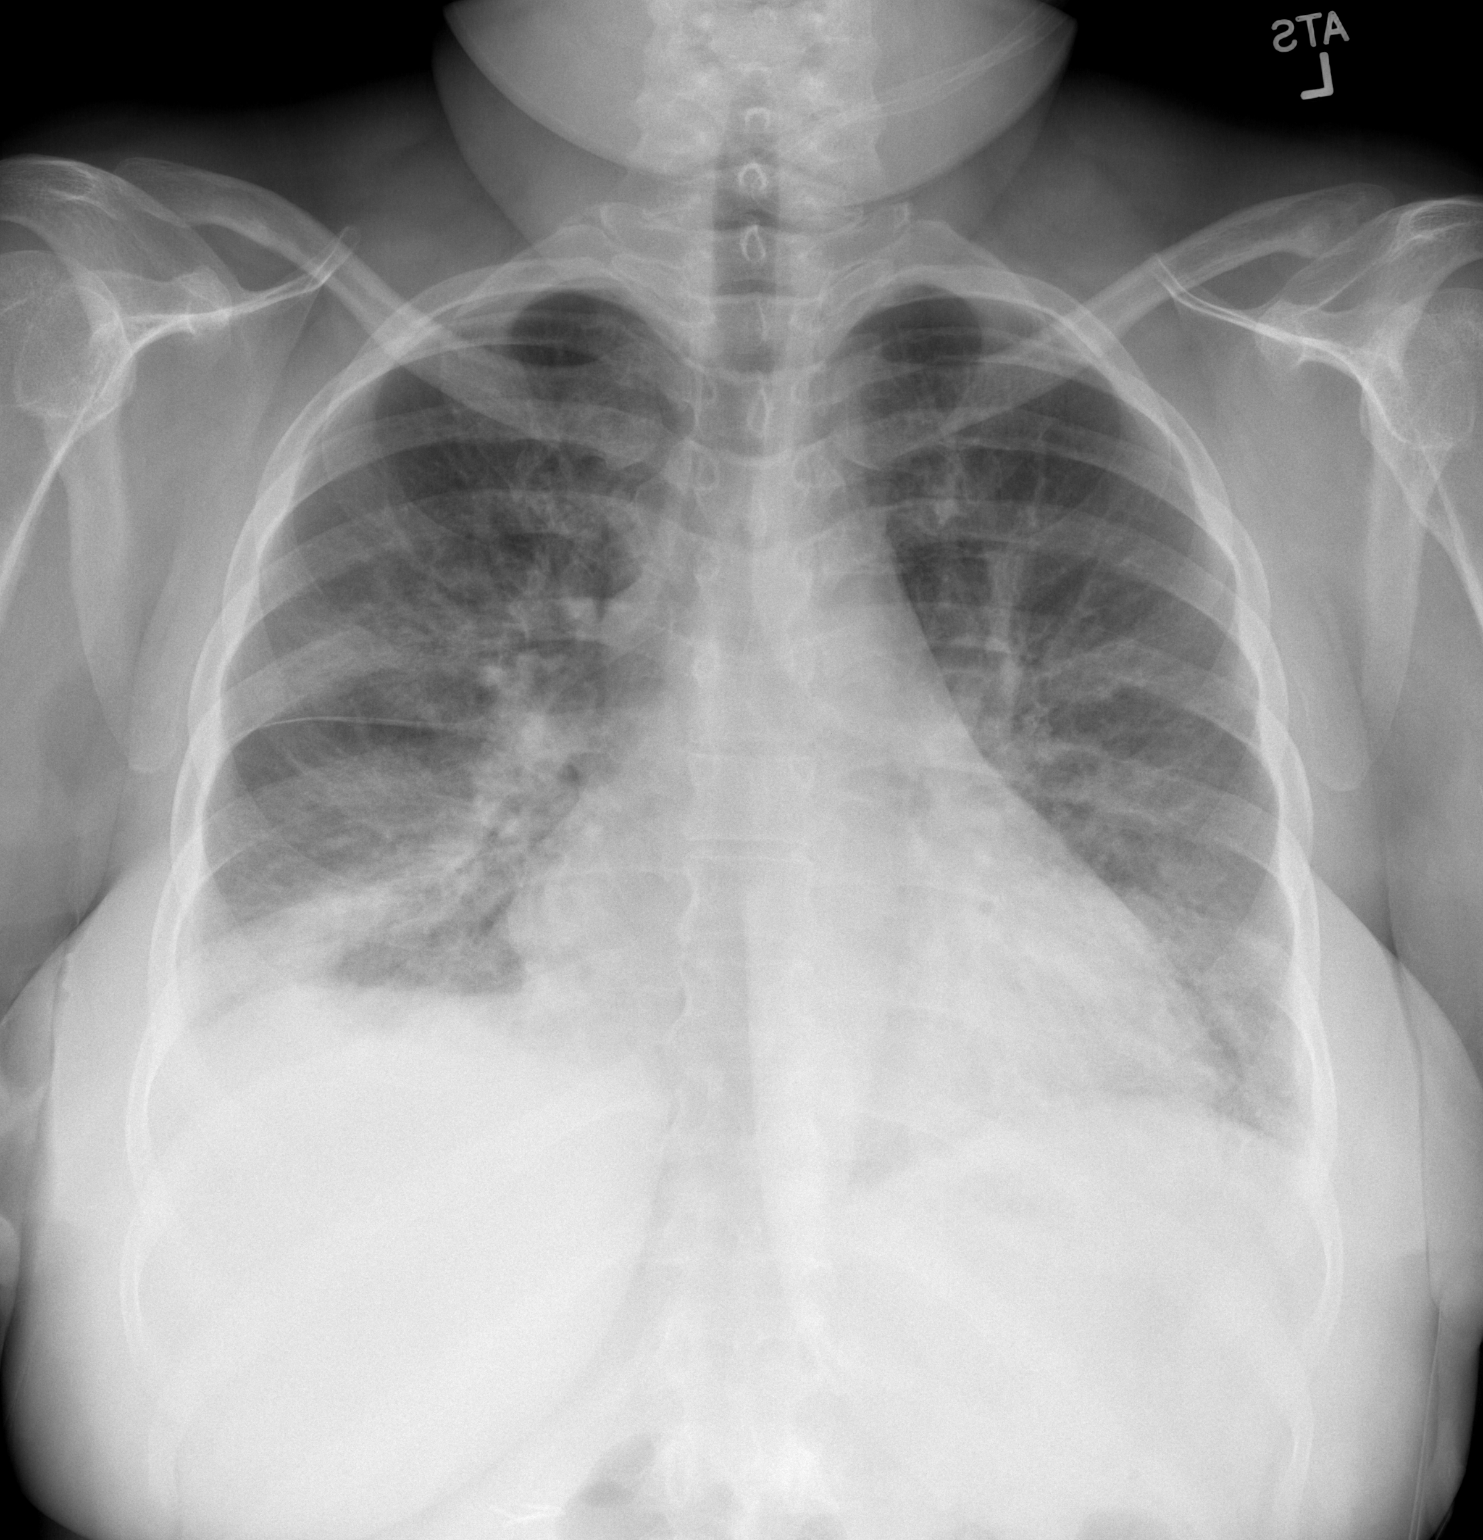

[w chest lat]
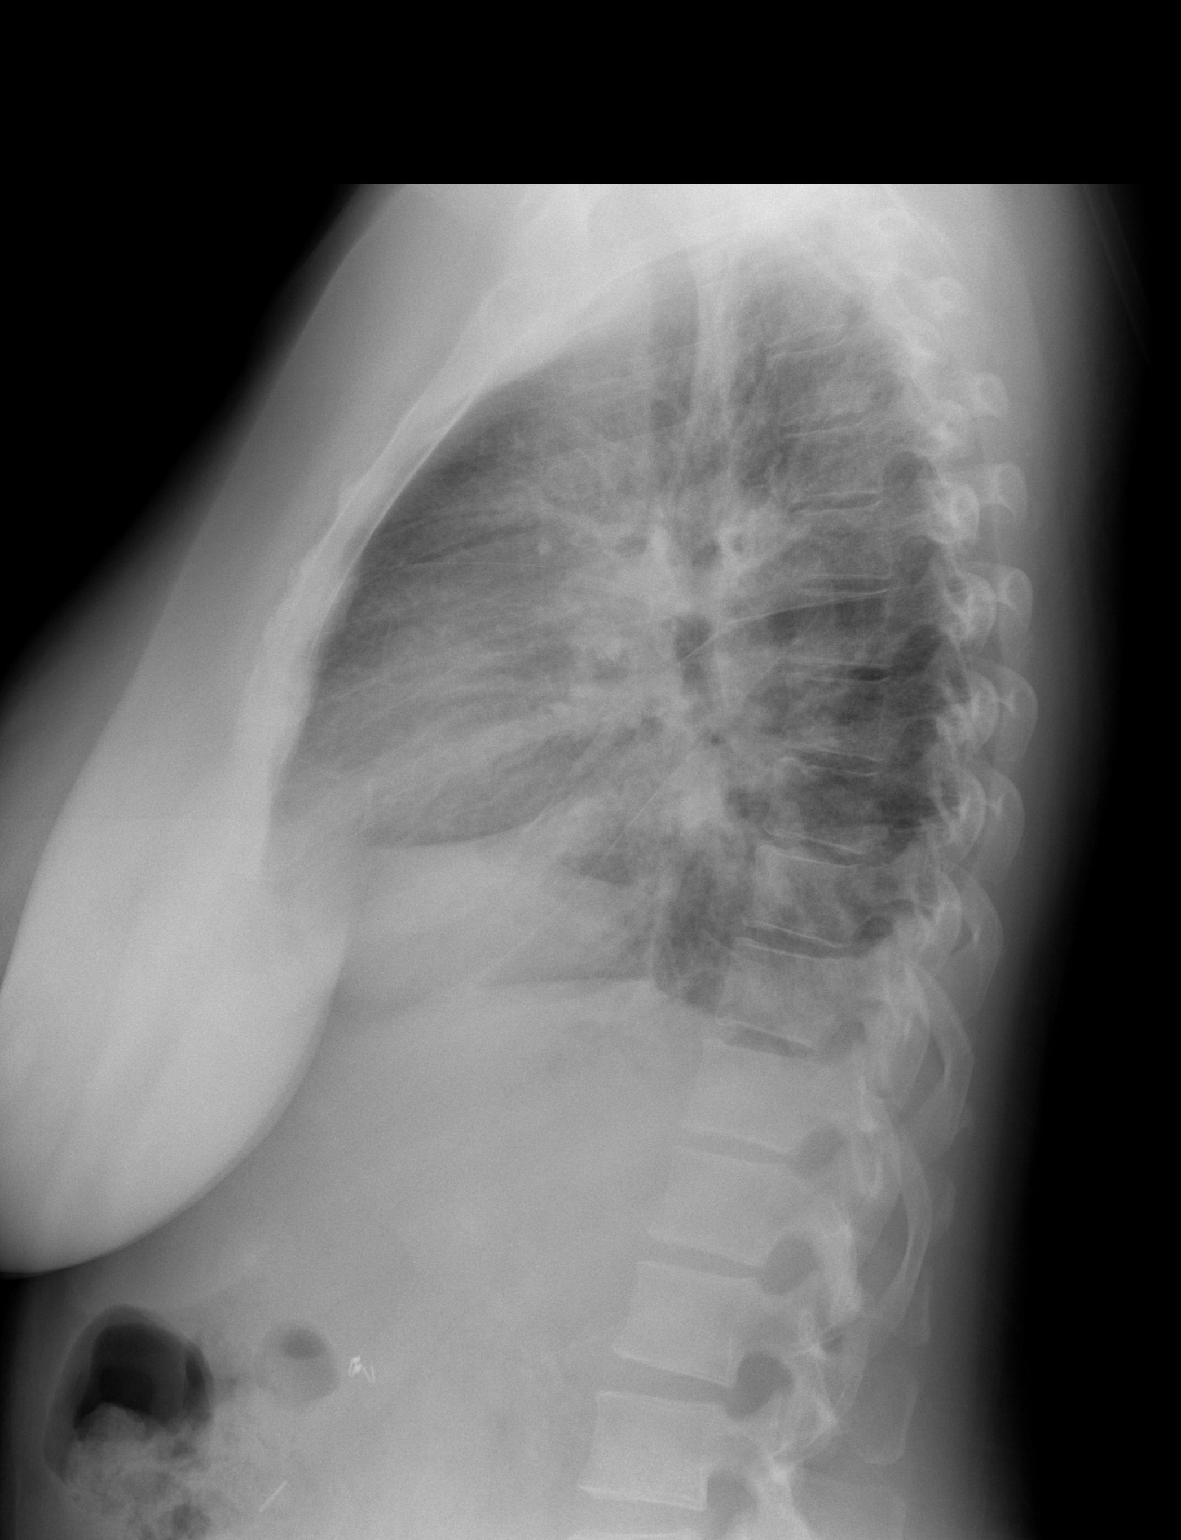

[2 of 2 positions shown; findings below may reference images not displayed]

FINDINGS: Confluent bibasilar airspace disease is new from the
prior exam.  Both lower lobes and probably also the right middle
lobe or lingula are affected.  Small pleural effusions.  Upper
lobes are spared.  Pulmonary vascular congestion has increased.
Stable cardiomegaly and mediastinal contours.  Visualized tracheal
air column is within normal limits.  No acute osseous abnormality
identified.
IMPRESSION: Bilateral lower lobe predominant airspace disease is most
compatible with pneumonia.  If there is not clinical evidence for
acute infection, pulmonary edema with atelectasis is a less likely
consideration.

## 2013-03-02 ENCOUNTER — Encounter (HOSPITAL_BASED_OUTPATIENT_CLINIC_OR_DEPARTMENT_OTHER): Payer: Self-pay | Admitting: Certified Registered Nurse Anesthetist

## 2013-03-02 ENCOUNTER — Ambulatory Visit (HOSPITAL_BASED_OUTPATIENT_CLINIC_OR_DEPARTMENT_OTHER): Payer: Medicare Other | Admitting: Certified Registered Nurse Anesthetist

## 2013-03-02 ENCOUNTER — Ambulatory Visit (HOSPITAL_BASED_OUTPATIENT_CLINIC_OR_DEPARTMENT_OTHER)
Admission: RE | Admit: 2013-03-02 | Discharge: 2013-03-02 | Disposition: A | Discharge status: Home / Self Care | Payer: Medicare Other | Source: Ambulatory Visit | Attending: Orthopedic Surgery | Admitting: Orthopedic Surgery

## 2013-03-02 ENCOUNTER — Encounter (HOSPITAL_BASED_OUTPATIENT_CLINIC_OR_DEPARTMENT_OTHER)
Admission: RE | Disposition: A | Discharge status: Home / Self Care | Payer: Self-pay | Source: Ambulatory Visit | Attending: Orthopedic Surgery

## 2013-03-02 DIAGNOSIS — E11319 Type 2 diabetes mellitus with unspecified diabetic retinopathy without macular edema: Secondary | ICD-10-CM | POA: Insufficient documentation

## 2013-03-02 DIAGNOSIS — J438 Other emphysema: Secondary | ICD-10-CM | POA: Insufficient documentation

## 2013-03-02 DIAGNOSIS — Z940 Kidney transplant status: Secondary | ICD-10-CM | POA: Insufficient documentation

## 2013-03-02 DIAGNOSIS — Z9483 Pancreas transplant status: Secondary | ICD-10-CM | POA: Insufficient documentation

## 2013-03-02 DIAGNOSIS — G5600 Carpal tunnel syndrome, unspecified upper limb: Secondary | ICD-10-CM | POA: Insufficient documentation

## 2013-03-02 DIAGNOSIS — I120 Hypertensive chronic kidney disease with stage 5 chronic kidney disease or end stage renal disease: Secondary | ICD-10-CM | POA: Insufficient documentation

## 2013-03-02 DIAGNOSIS — N186 End stage renal disease: Secondary | ICD-10-CM | POA: Insufficient documentation

## 2013-03-02 DIAGNOSIS — E1039 Type 1 diabetes mellitus with other diabetic ophthalmic complication: Secondary | ICD-10-CM | POA: Insufficient documentation

## 2013-03-02 DIAGNOSIS — Z79899 Other long term (current) drug therapy: Secondary | ICD-10-CM | POA: Insufficient documentation

## 2013-03-02 DIAGNOSIS — Z794 Long term (current) use of insulin: Secondary | ICD-10-CM | POA: Insufficient documentation

## 2013-03-02 HISTORY — PX: CARPAL TUNNEL RELEASE: SHX101

## 2013-03-02 LAB — POCT I-STAT, CHEM 8
BUN: 36 mg/dL — ABNORMAL HIGH (ref 6–23)
Creatinine, Ser: 8 mg/dL — ABNORMAL HIGH (ref 0.50–1.10)
Hemoglobin: 11.6 g/dL — ABNORMAL LOW (ref 12.0–15.0)
Potassium: 5.4 mEq/L — ABNORMAL HIGH (ref 3.5–5.1)
Sodium: 131 mEq/L — ABNORMAL LOW (ref 135–145)

## 2013-03-02 LAB — GLUCOSE, CAPILLARY: Glucose-Capillary: 359 mg/dL — ABNORMAL HIGH (ref 70–99)

## 2013-03-02 SURGERY — CARPAL TUNNEL RELEASE
Anesthesia: General | Site: Wrist | Laterality: Right | Wound class: Clean

## 2013-03-02 MED ORDER — INSULIN ASPART 100 UNIT/ML ~~LOC~~ SOLN
4.0000 [IU] | Freq: Once | SUBCUTANEOUS | Status: AC
  Administered 2013-03-02: 4 [IU] via SUBCUTANEOUS

## 2013-03-02 MED ORDER — VANCOMYCIN HCL IN DEXTROSE 1-5 GM/200ML-% IV SOLN
1000.0000 mg | INTRAVENOUS | Status: AC
  Administered 2013-03-02: 1000 mg via INTRAVENOUS

## 2013-03-02 MED ORDER — LIDOCAINE HCL (CARDIAC) 20 MG/ML IV SOLN
INTRAVENOUS | Status: DC | PRN
  Administered 2013-03-02: 30 mg via INTRAVENOUS

## 2013-03-02 MED ORDER — OXYCODONE-ACETAMINOPHEN 5-325 MG PO TABS
1.0000 | ORAL_TABLET | Freq: Once | ORAL | Status: AC | PRN
  Administered 2013-03-02: 1 via ORAL

## 2013-03-02 MED ORDER — SODIUM CHLORIDE 0.9 % IV SOLN
INTRAVENOUS | Status: DC
  Administered 2013-03-02: 08:00:00 via INTRAVENOUS

## 2013-03-02 MED ORDER — MIDAZOLAM HCL 2 MG/2ML IJ SOLN: 1.0000 mg | INTRAMUSCULAR | Status: DC | PRN

## 2013-03-02 MED ORDER — BUPIVACAINE HCL (PF) 0.25 % IJ SOLN
INTRAMUSCULAR | Status: DC | PRN
  Administered 2013-03-02: 10 mL

## 2013-03-02 MED ORDER — MIDAZOLAM HCL 5 MG/5ML IJ SOLN
INTRAMUSCULAR | Status: DC | PRN
  Administered 2013-03-02: 1 mg via INTRAVENOUS

## 2013-03-02 MED ORDER — CHLORHEXIDINE GLUCONATE 4 % EX LIQD: 60.0000 mL | Freq: Once | CUTANEOUS | Status: DC

## 2013-03-02 MED ORDER — OXYCODONE-ACETAMINOPHEN 5-325 MG PO TABS: ORAL_TABLET | ORAL | Status: DC

## 2013-03-02 MED ORDER — LACTATED RINGERS IV SOLN: INTRAVENOUS | Status: DC

## 2013-03-02 MED ORDER — FENTANYL CITRATE 0.05 MG/ML IJ SOLN
INTRAMUSCULAR | Status: DC | PRN
  Administered 2013-03-02: 50 ug via INTRAVENOUS

## 2013-03-02 MED ORDER — PROMETHAZINE HCL 25 MG/ML IJ SOLN: 6.2500 mg | INTRAMUSCULAR | Status: DC | PRN

## 2013-03-02 MED ORDER — EPHEDRINE SULFATE 50 MG/ML IJ SOLN
INTRAMUSCULAR | Status: DC | PRN
  Administered 2013-03-02: 10 mg via INTRAVENOUS

## 2013-03-02 MED ORDER — HYDROMORPHONE HCL PF 1 MG/ML IJ SOLN
0.2500 mg | INTRAMUSCULAR | Status: DC | PRN
  Administered 2013-03-02 (×4): 0.5 mg via INTRAVENOUS

## 2013-03-02 MED ORDER — PROPOFOL 10 MG/ML IV BOLUS
INTRAVENOUS | Status: DC | PRN
  Administered 2013-03-02: 200 mg via INTRAVENOUS

## 2013-03-02 MED ORDER — FENTANYL CITRATE 0.05 MG/ML IJ SOLN: 50.0000 ug | INTRAMUSCULAR | Status: DC | PRN

## 2013-03-02 SURGICAL SUPPLY — 36 items
BANDAGE ELASTIC 3 VELCRO ST LF (GAUZE/BANDAGES/DRESSINGS) ×2 IMPLANT
BANDAGE GAUZE ELAST BULKY 4 IN (GAUZE/BANDAGES/DRESSINGS) ×2 IMPLANT
BLADE MINI RND TIP GREEN BEAV (BLADE) IMPLANT
BLADE SURG 15 STRL LF DISP TIS (BLADE) ×2 IMPLANT
BLADE SURG 15 STRL SS (BLADE) ×2
BNDG ESMARK 4X9 LF (GAUZE/BANDAGES/DRESSINGS) ×2 IMPLANT
CHLORAPREP W/TINT 26ML (MISCELLANEOUS) ×2 IMPLANT
CLOTH BEACON ORANGE TIMEOUT ST (SAFETY) ×2 IMPLANT
CORDS BIPOLAR (ELECTRODE) ×2 IMPLANT
COVER MAYO STAND STRL (DRAPES) ×2 IMPLANT
COVER TABLE BACK 60X90 (DRAPES) ×2 IMPLANT
CUFF TOURNIQUET SINGLE 18IN (TOURNIQUET CUFF) ×2 IMPLANT
DRAPE EXTREMITY T 121X128X90 (DRAPE) ×2 IMPLANT
DRAPE SURG 17X23 STRL (DRAPES) ×2 IMPLANT
DRSG PAD ABDOMINAL 8X10 ST (GAUZE/BANDAGES/DRESSINGS) ×2 IMPLANT
GAUZE XEROFORM 1X8 LF (GAUZE/BANDAGES/DRESSINGS) ×2 IMPLANT
GLOVE BIO SURGEON STRL SZ 6.5 (GLOVE) ×2 IMPLANT
GLOVE BIO SURGEON STRL SZ7.5 (GLOVE) ×2 IMPLANT
GLOVE BIOGEL PI IND STRL 7.0 (GLOVE) ×1 IMPLANT
GLOVE BIOGEL PI IND STRL 8 (GLOVE) ×1 IMPLANT
GLOVE BIOGEL PI INDICATOR 7.0 (GLOVE) ×1
GLOVE BIOGEL PI INDICATOR 8 (GLOVE) ×1
GOWN BRE IMP PREV XXLGXLNG (GOWN DISPOSABLE) ×2 IMPLANT
GOWN PREVENTION PLUS XLARGE (GOWN DISPOSABLE) ×2 IMPLANT
NEEDLE HYPO 25X1 1.5 SAFETY (NEEDLE) IMPLANT
NS IRRIG 1000ML POUR BTL (IV SOLUTION) ×2 IMPLANT
PACK BASIN DAY SURGERY FS (CUSTOM PROCEDURE TRAY) ×2 IMPLANT
PADDING CAST ABS 4INX4YD NS (CAST SUPPLIES) ×1
PADDING CAST ABS COTTON 4X4 ST (CAST SUPPLIES) ×1 IMPLANT
SPONGE GAUZE 4X4 12PLY (GAUZE/BANDAGES/DRESSINGS) ×2 IMPLANT
STOCKINETTE 4X48 STRL (DRAPES) ×2 IMPLANT
SUT ETHILON 4 0 PS 2 18 (SUTURE) ×2 IMPLANT
SYR BULB 3OZ (MISCELLANEOUS) ×2 IMPLANT
SYR CONTROL 10ML LL (SYRINGE) ×2 IMPLANT
TOWEL OR 17X24 6PK STRL BLUE (TOWEL DISPOSABLE) ×4 IMPLANT
UNDERPAD 30X30 INCONTINENT (UNDERPADS AND DIAPERS) ×2 IMPLANT

## 2013-03-02 NOTE — Brief Op Note (Signed)
03/02/2013  9:03 AM  PATIENT:  [name]  42 y.o. female  PRE-OPERATIVE DIAGNOSIS:  RIGHT CARPAL TUNNEL SYNDROME  POST-OPERATIVE DIAGNOSIS:  right carpal tunnel syndrome  PROCEDURE:  Procedure(s): CARPAL TUNNEL RELEASE  SURGEON:  Surgeon(s): Tami Ribas, MD  PHYSICIAN ASSISTANT:   ASSISTANTS: none   ANESTHESIA:   general  EBL:  Total I/O In: 100 [I.V.:100] Out: -   DRAINS: none   LOCAL MEDICATIONS USED:  MARCAINE     SPECIMEN:  No Specimen  DISPOSITION OF SPECIMEN:  N/A  COUNTS:  YES  TOURNIQUET:   Total Tourniquet Time Documented: Forearm (Right) - 15 minutes Total: Forearm (Right) - 15 minutes   DICTATION: .Other Dictation: Dictation Number [id number]  PLAN OF CARE: Discharge to home after PACU

## 2013-03-02 NOTE — H&P (Signed)
[name] is an 42 y.o. female.   Chief Complaint: carpal tunnel syndrome HPI: 43 yo rhd female with over 1 year hand pain and pins and needles sensation in digits.  Has tried splinting without relief.  Nocturnal symptoms that wake her 3x/week.  Positive nerve conduction studies.  She wishes to have a carpal tunnel release for management of symptoms.  Past Medical History  Diagnosis Date  . Hyperlipidemia   . Alopecia   . Obesity   . Iron deficiency   . 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 RLS (restless legs syndrome)   . Respiratory failure   . Injury of conjunctiva and corneal abrasion of right eye without foreign body   . Cellulitis   . Dialysis patient     Tues; Thurs; Sat; [place]  . 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 Anemia   . Pneumonia 2012    "double"  . Blood transfusion 2004    "when I had my transplant"  . Migraine   . 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 Hyperparathyroidism, secondary   . Diabetic retinopathy   . Hypertension     takes Metoprolol and Exforge daily  . Depression     takes Klonopin nightly  . Diabetes mellitus type 1     Pt states diagnosed at age 59 with prior episodes of DKA. S/p pancreatic transplant   . GERD (gastroesophageal reflux disease)     takes Protonix daily  . PONV (postoperative nausea and vomiting)   . Bronchitis   . Migraine   . Asthma   . Shortness of breath   . Emphysema of lung   . DKA (diabetic ketoacidoses) 07/02/2012  . Angina     none in past 2 years.    Past Surgical History  Procedure Laterality Date  . 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 Thyroglobulin      x 7  . Av fistula placement      right upper arm  . Cholecystectomy  1995  .  hd graft placement/removal      "had 2 in my left upper arm"  . Eye surgery    .  Retinopathy surgery    . Tooth extraction  10/10/11    X's two  . Insertion of dialysis catheter  12/08/2011    Procedure: INSERTION OF DIALYSIS CATHETER;  Surgeon: Chuck Hint, MD;  Location: [facility] OR;  Service: Vascular;  Laterality: Left;  . Av fistula placement  01/22/2012    Procedure: INSERTION OF ARTERIOVENOUS (AV) GORE-TEX GRAFT ARM;  Surgeon: Chuck Hint, MD;  Location[facility] OR;  Service: Vascular;  Laterality: Left;  Ultrasound guided.  . Av fistula placement  03/14/2012    Procedure: INSERTION OF ARTERIOVENOUS (AV) GORE-TEX GRAFT THIGH;  Surgeon: Chuck Hint, MD;  Location: [facility] OR;  Service: Vascular;  Laterality: Right;  . False aneurysm repair Right 01/02/2013    Procedure: Excision of lymphocele in right thigh.;  Surgeon: Chuck Hint, MD;  Location: [facility] OR;  Service: Vascular;  Laterality: Right;    Family History  Problem Relation Age of Onset  . Hypertension Mother   . Kidney disease Mother   . Diabetes Mother   . Hypertension Father   . Kidney disease Father   . Diabetes Father  Social History:  reports that she has never smoked. She has never used smokeless tobacco. She reports that she does not drink alcohol or use illicit drugs.  Allergies:  Allergies  Allergen Reactions  . Morphine And Related Nausea And Vomiting and Other (See Comments)    "makes me delerirous"  . Penicillins Anaphylaxis  . Tramadol Nausea And Vomiting  . Depakote [Divalproex Sodium] Other (See Comments)    Pt gets "delirious"  . Vicodin [Hydrocodone-Acetaminophen] Rash    Medications Prior to Admission  Medication Sig Dispense Refill  . albuterol (PROVENTIL HFA;VENTOLIN HFA) 108 (90 BASE) MCG/ACT inhaler Inhale 2 puffs into the lungs every 4 (four) hours as needed. For wheezing.       Marland Kitchen aspirin EC 81 MG tablet Take 81 mg by mouth daily.      . calcium acetate (PHOSLO) 667 MG capsule Take 2,001-2,668 mg by mouth 3 (three) times daily with meals. Dosage  depends on meal consumed      . clonazePAM (KLONOPIN) 0.5 MG tablet Take 1 mg by mouth at bedtime.       Marland Kitchen dextromethorphan-guaiFENesin (MUCINEX DM) 30-600 MG per 12 hr tablet Take 1 tablet by mouth 2 (two) times daily.  20 tablet  0  . diphenhydrAMINE (BENADRYL) 50 MG tablet Take 50 mg by mouth at bedtime as needed for allergies.       Marland Kitchen EXFORGE 10-160 MG per tablet Take 1 tablet by mouth at bedtime.       . famotidine (PEPCID) 20 MG tablet Take 20 mg by mouth at bedtime.      . gabapentin (NEURONTIN) 100 MG capsule Take 100 mg by mouth daily.      . Insulin Aspart (NOVOLOG FLEXPEN [place]) Inject 4 Units into the skin 2 (two) times daily. Takes 4 units in am and at night, if pt eats lunch she will take insulin before she eats      . insulin glargine (LANTUS) 100 units/mL SOLN Inject 5 Units into the skin 2 (two) times daily.      . metoprolol succinate (TOPROL-XL) 100 MG 24 hr tablet Take 100 mg by mouth daily. Take with or immediately following a meal.      . multivitamin (RENA-VIT) TABS tablet Take 1 tablet by mouth daily.      . mycophenolate (CELLCEPT) 250 MG capsule Take 250 mg by mouth 2 (two) times daily.      . pantoprazole (PROTONIX) 40 MG tablet Take 40 mg by mouth daily.      . predniSONE (DELTASONE) 5 MG tablet Take 5 mg by mouth daily.       . tacrolimus (PROGRAF) 1 MG capsule Take 1 mg by mouth 2 (two) times daily.      Marland Kitchen zolpidem (AMBIEN CR) 12.5 MG CR tablet Take 12.5 mg by mouth at bedtime as needed for sleep.      Marland Kitchen albuterol (PROVENTIL HFA;VENTOLIN HFA) 108 (90 BASE) MCG/ACT inhaler Inhale 2 puffs into the lungs every 4 (four) hours as needed for wheezing or shortness of breath (as per home regimen).  1 Inhaler  0  . levofloxacin (LEVAQUIN) 500 MG tablet Take 1 tablet (500 mg total) by mouth daily.  4 tablet  0    No results found for this or any previous visit (from the past 48 hour(s)).  No results found.   A comprehensive review of systems was negative except for:  Behavioral/Psych: positive for anxiety, depression and sleep disturbance  Blood pressure 136/80, pulse 80,  temperature 98.7 F (37.1 C), temperature source Oral, resp. rate 20, height 5\' 1"  (1.549 m), weight 151 lb 3.2 oz (68.584 kg), last menstrual period 02/16/2013, SpO2 100.00%.  General appearance: alert, cooperative and appears stated age Head: Normocephalic, without obvious abnormality, atraumatic Neck: supple, symmetrical, trachea midline Resp: clear to auscultation bilaterally Cardio: regular rate and rhythm GI: non tender Extremities: altered sensation in digits.  brisk capillary refill.  +epl/fpl/io.  no wounds. Pulses: 2+ and symmetric Skin: Skin color, texture, turgor normal. No rashes or lesions Neurologic: Grossly normal Incision/Wound: na  Assessment/Plan Bilateral carpal tunnel syndrome.  Non operative and operative treatment options were discussed with the patient and patient wishes to proceed with operative treatment. She wishes to have a right carpal tunnel release for management of symptoms.  Risks, benefits, and alternatives of surgery were discussed and the patient agrees with the plan of care.   Shey Bartmess R 03/02/2013, 8:26 AM

## 2013-03-02 NOTE — Anesthesia Procedure Notes (Signed)
Procedure Name: LMA Insertion Date/Time: 03/02/2013 8:34 AM Performed by: Alonza Knisley D Pre-anesthesia Checklist: Patient identified, Emergency Drugs available, Suction available and Patient being monitored Patient Re-evaluated:Patient Re-evaluated prior to inductionOxygen Delivery Method: Circle System Utilized Preoxygenation: Pre-oxygenation with 100% oxygen Intubation Type: IV induction Ventilation: Mask ventilation without difficulty LMA: LMA inserted LMA Size: 4.0 Number of attempts: 1 Airway Equipment and Method: bite block Placement Confirmation: positive ETCO2 Tube secured with: Tape Dental Injury: Teeth and Oropharynx as per pre-operative assessment

## 2013-03-02 NOTE — Anesthesia Preprocedure Evaluation (Addendum)
Anesthesia Evaluation  Patient identified by MRN, date of birth, ID band Patient awake    Reviewed: Allergy & Precautions, NPO status   History of Anesthesia Complications (+) PONV  Airway       Dental   Pulmonary shortness of breath, asthma , COPD COPD inhaler,          Cardiovascular hypertension, Pt. on home beta blockers + angina     Neuro/Psych  Headaches, PSYCHIATRIC DISORDERS Depression    GI/Hepatic Neg liver ROS, GERD-  Medicated,  Endo/Other  diabetes, Type 2, Insulin Dependent  Renal/GU Dialysis and ESRFRenal disease     Musculoskeletal negative musculoskeletal ROS (+)   Abdominal   Peds  Hematology negative hematology ROS (+)   Anesthesia Other Findings   Reproductive/Obstetrics                          Anesthesia Physical Anesthesia Plan  ASA: III  Anesthesia Plan:    Post-op Pain Management:    Induction:   Airway Management Planned:   Additional Equipment:   Intra-op Plan:   Post-operative Plan:   Informed Consent:   Plan Discussed with: CRNA, Anesthesiologist and Surgeon  Anesthesia Plan Comments:         Anesthesia Quick Evaluation

## 2013-03-02 NOTE — Transfer of Care (Signed)
Immediate Anesthesia Transfer of Care Note  Patient: [name]  Procedure(s) Performed: Procedure(s): CARPAL TUNNEL RELEASE (Right)  Patient Location: PACU  Anesthesia Type:General  Level of Consciousness: awake and patient cooperative  Airway & Oxygen Therapy: Patient Spontanous Breathing and Patient connected to face mask oxygen  Post-op Assessment: Report given to PACU RN and Post -op Vital signs reviewed and stable  Post vital signs: Reviewed and stable  Complications: No apparent anesthesia complications

## 2013-03-02 NOTE — Anesthesia Postprocedure Evaluation (Signed)
Anesthesia Post Note  Patient: [name]  Procedure(s) Performed: Procedure(s) (LRB): CARPAL TUNNEL RELEASE (Right)  Anesthesia type: general  Patient location: PACU  Post pain: Pain level controlled  Post assessment: Patient's Cardiovascular Status Stable  Last Vitals:  Filed Vitals:   03/02/13 1030  BP: 162/75  Pulse:   Temp:   Resp: 17    Post vital signs: Reviewed and stable  Level of consciousness: sedated  Complications: No apparent anesthesia complications

## 2013-03-02 NOTE — Op Note (Signed)
[id number] 

## 2013-03-03 ENCOUNTER — Encounter (HOSPITAL_BASED_OUTPATIENT_CLINIC_OR_DEPARTMENT_OTHER): Payer: Self-pay | Admitting: Orthopedic Surgery

## 2013-03-03 IMAGING — CR DG CHEST 1V PORT
1 series · 1 of 1 positions shown · non-contrast
Comparison: Chest radiograph performed earlier today at [DATE] p.m.

CLINICAL DATA: Status post unsuccessful central line placement.
Assess for pneumothorax.

PORTABLE CHEST - 1 VIEW

[view not recorded]
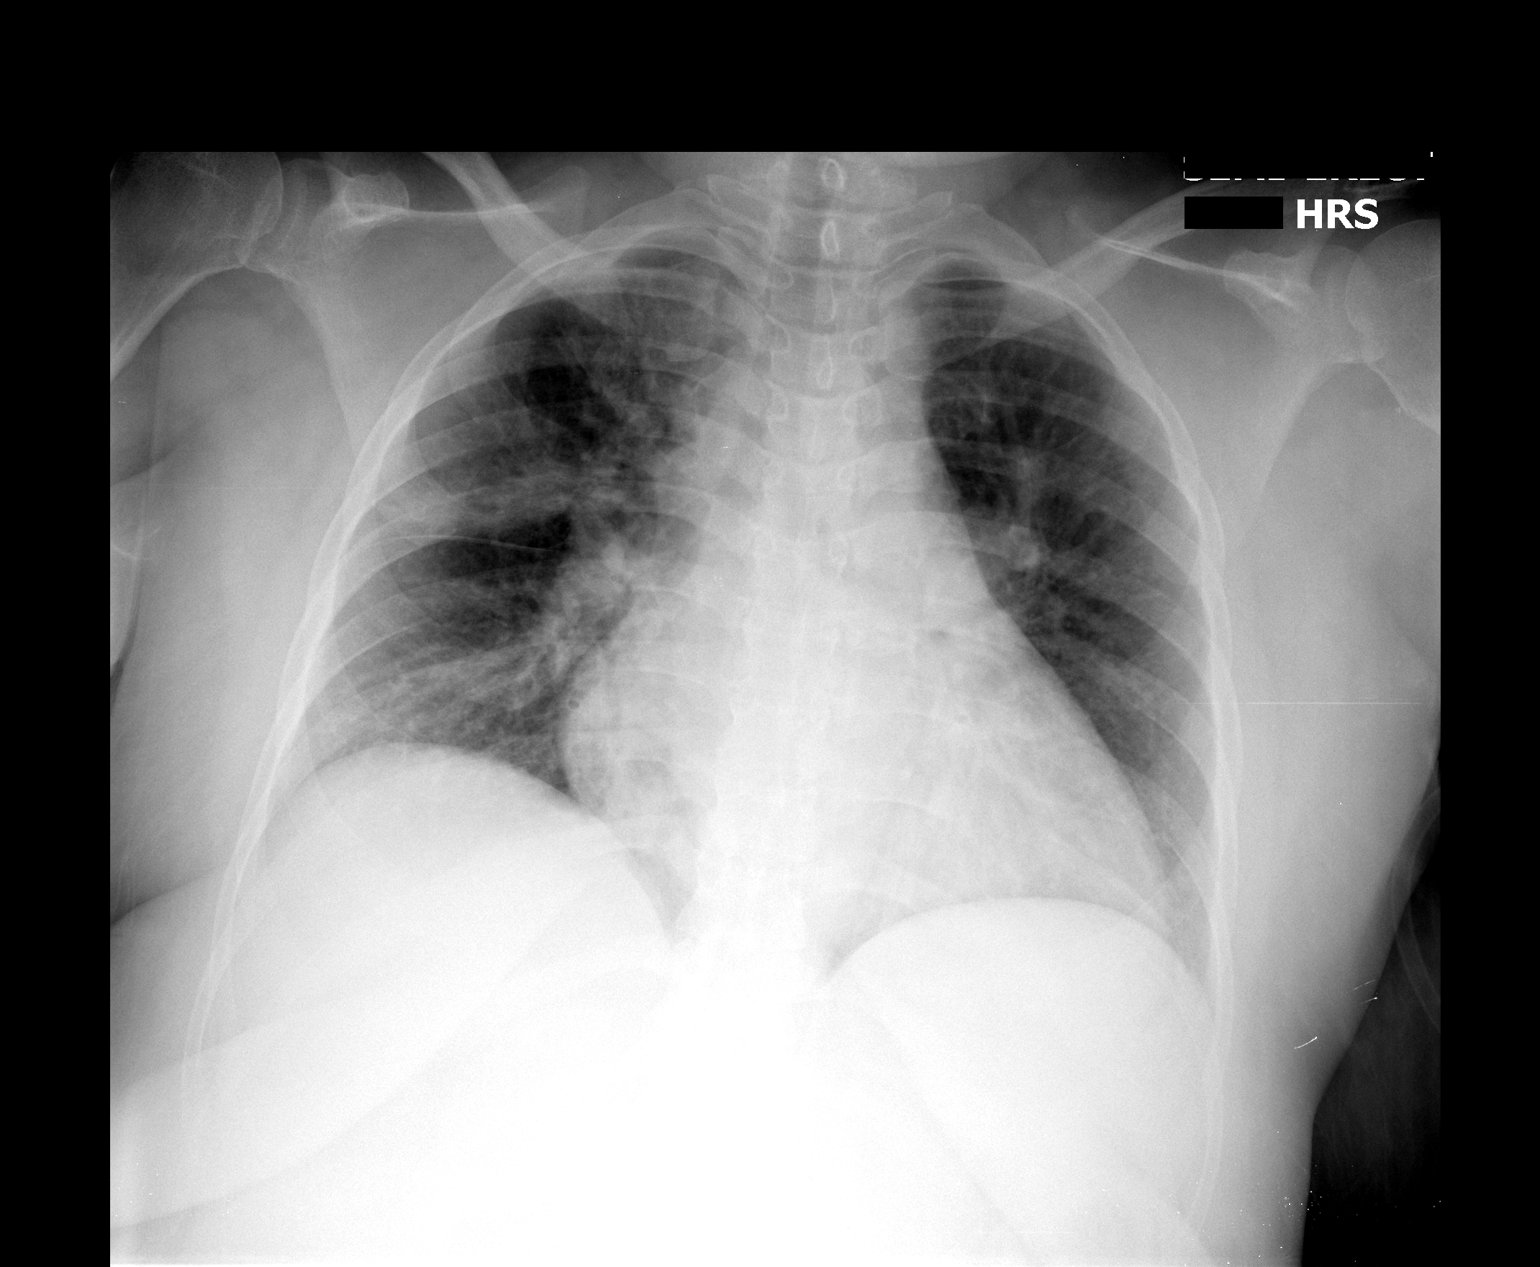

[1 of 1 positions shown; findings below may reference images not displayed]

FINDINGS: No pneumothorax is seen.

The lungs are mildly hypoexpanded; mild vascular crowding and
vascular congestion are seen.  Mild right midlung opacity is seen,
raising question for mildly worsening pneumonia.  No pleural
effusion is seen.

The cardiomediastinal silhouette is borderline enlarged.  No acute
osseous abnormalities are identified.
IMPRESSION: 1.  No evidence of pneumothorax.
2.  Mild new right midlung opacity raises question for mildly
worsening pneumonia.
3.  Lungs mildly hypoexpanded; mild vascular congestion and
borderline cardiomegaly noted.

## 2013-03-03 IMAGING — CR DG CHEST 1V
1 series · 1 of 1 positions shown · non-contrast
Comparison: 02/07/2011

CLINICAL DATA: Chest pain, shortness of breath, cough, congestion,
fever, hypertension, diabetes

CHEST - 1 VIEW

[x chest ap]
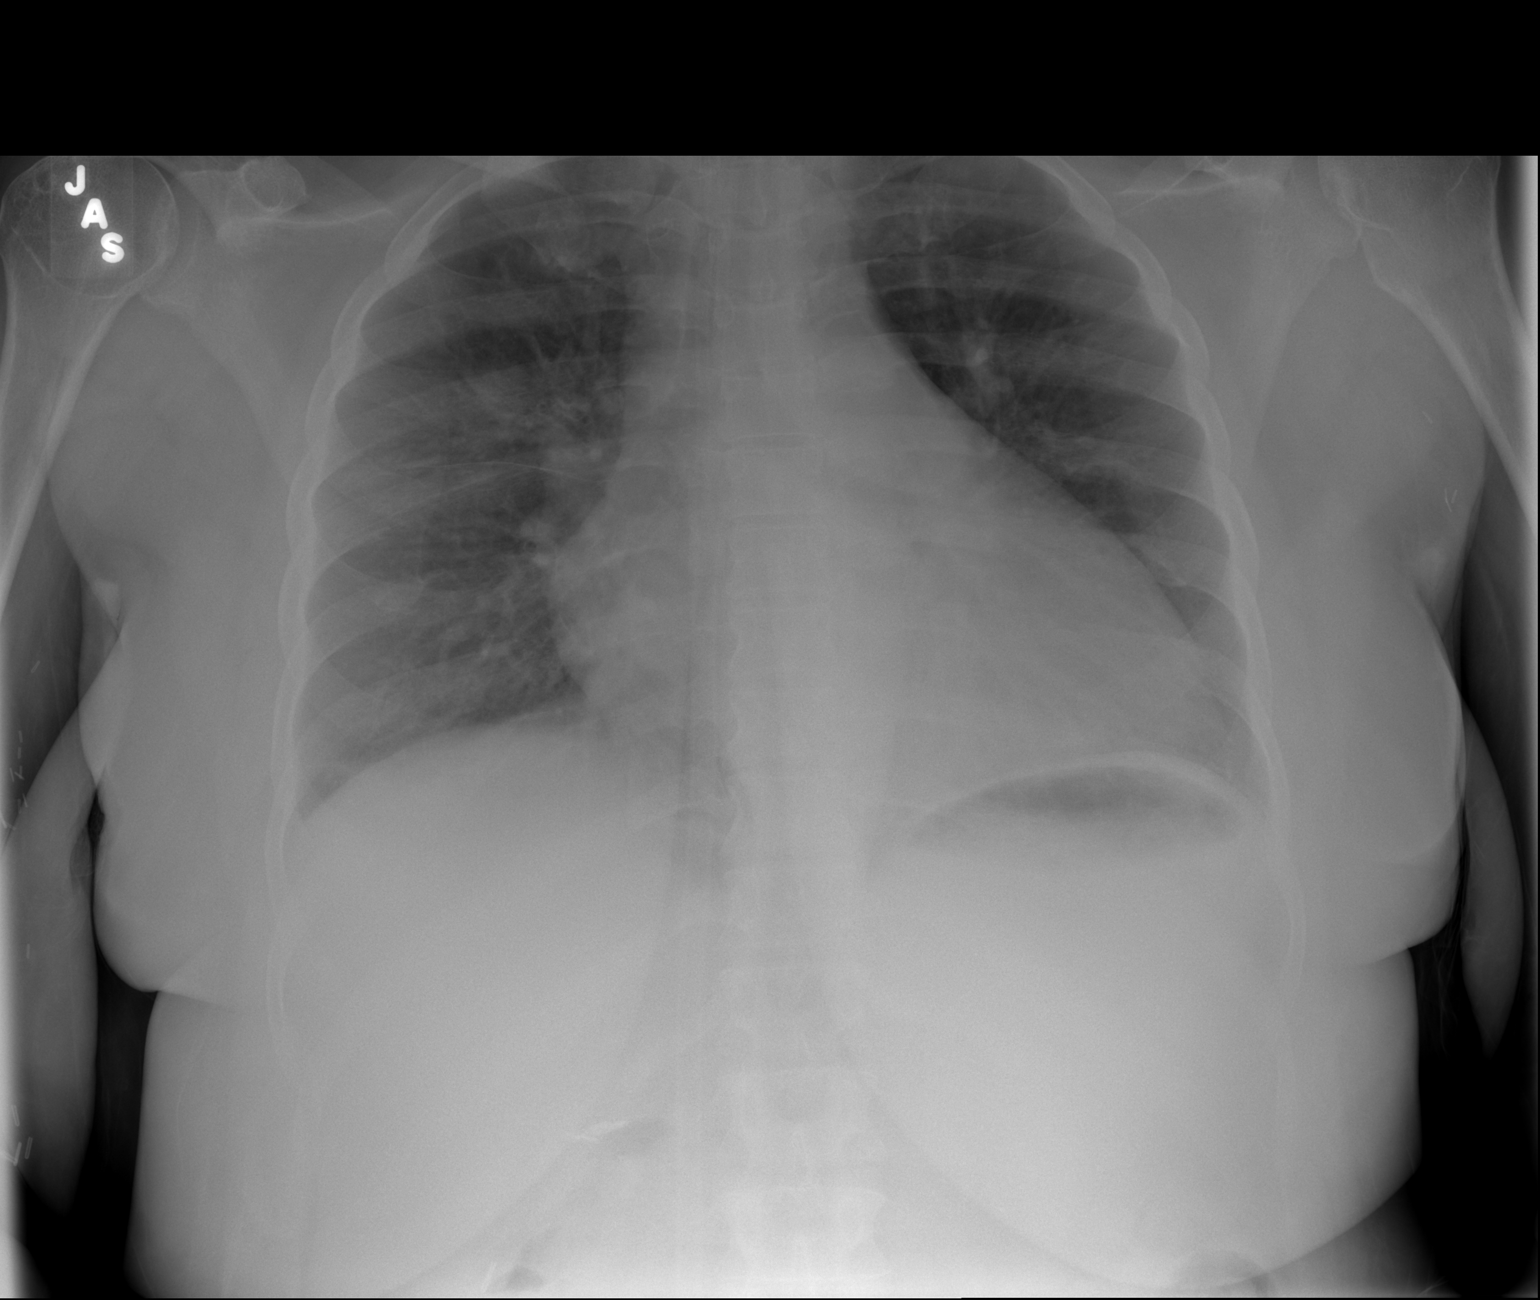

[1 of 1 positions shown; findings below may reference images not displayed]

FINDINGS: Enlargement of cardiac silhouette.
Pulmonary vascular congestion.
Improved pulmonary infiltrate versus prior study, with residual
perihilar infiltrate noted.
Peribronchial thickening.
No pleural effusion or pneumothorax.
No acute osseous findings.
IMPRESSION: Enlargement of cardiac silhouette with pulmonary vascular
congestion.
Improved pulmonary infiltrates.

## 2013-03-03 NOTE — Op Note (Signed)
[name], [name] NO.:  [id number]  MEDICAL RECORD NO.:  [id number]  LOCATION:                               FACILITY:  [facility]  PHYSICIAN:  Betha Loa, MD        DATE OF BIRTH:  05-05-1971  DATE OF PROCEDURE:  03/02/2013 DATE OF DISCHARGE:  03/02/2013                              OPERATIVE REPORT   PREOPERATIVE DIAGNOSIS:  Right carpal tunnel syndrome.  POSTOPERATIVE DIAGNOSIS:  Right carpal tunnel syndrome.  PROCEDURE:  Right carpal tunnel release.  SURGEON:  Betha Loa, MD  ASSISTANT:  None.  ANESTHESIA:  General.  IV FLUIDS:  Per anesthesia flow sheet.  ESTIMATED BLOOD LOSS:  Minimal.  COMPLICATIONS:  None.  SPECIMENS:  None.  TOURNIQUET TIME:  14 minutes.  DISPOSITION:  Stable to PACU.  INDICATIONS:  [name] is a 42 year old right-hand dominant female who has had over a year of symptoms on bilateral hands including pain and pins and needle sensation.  She notes altered sensation in the fingers. She has positive nerve conduction studies.  She has nocturnal symptoms at least 3 times a week, which wake her up and cause her shake her hands to get relief.  She wishes to have a carpal tunnel release for management of the symptoms.  Risks, benefits and alternatives of surgery were discussed including the risk of blood loss; infection; damage to nerves, vessels, tendons, ligaments, bone; failure of surgery; need for additional surgery; complications with wound healing; continued pain; continued carpal tunnel syndrome.  She voiced understanding of these and elected to proceed.  OPERATIVE COURSE:  After being identified preoperatively by myself, the patient and I agreed upon the procedure and site of procedure.  Surgical site has been marked.  Risks, benefits, and alternatives of the surgery were reviewed and she wished to proceed.  Surgical consent had been signed.  She was given IV vancomycin as preoperative antibiotic prophylaxis due to  penicillin allergy.  She has transferred to the operating room and placed on the operating room table in supine position with the right upper extremity on an armboard.  General anesthesia was induced by anesthesiologist.  The right upper extremity was prepped and draped in normal sterile orthopedic fashion.  A surgical pause was performed between the surgeons, anesthesia, and operating room staff, and all were in agreement as to the patient, procedure, and site of procedure.  Tourniquet at the proximal aspect of the forearm was inflated to 250 mmHg after exsanguination of the limb with Esmarch bandage.  An incision was made over the transverse carpal ligament and carried into subcutaneous tissues by spreading technique.  Bipolar electrocautery was used to obtain hemostasis.  The fascia was sharply incised.  The transverse carpal ligament was identified and incised with the knife in its distal direction.  Care was taken to ensure complete decompression distally.  It was then incised proximally.  The scissors were used to split the distal aspect of the volar antebrachial fascia. Finger was placed into the wound to ensure complete decompression, which was the case.  The nerve was inspected.  It was adherent to the radial leaflet.  It was flattened.  The motor branch  was identified and was intact.  The wound was copiously irrigated with sterile saline.  It was then closed with 4-0 nylon in a horizontal mattress fashion.  The wound was injected with 10 mL of 0.25% plain Marcaine to aid in postoperative analgesia.  It was dressed with sterile Xeroform, 4x4s, and ABD, and wrapped with Kerlix and Ace bandage.  Tourniquet was deflated at 14 minutes.  Fingertips were pink with brisk capillary refill after deflation of the tourniquet.  Operative drapes were broken down, and the patient was awoken from anesthesia safely.  She was transferred back to the stretcher and taken to PACU in stable condition.   I will see her back in the office in 1 week for postoperative followup.  I will give her Percocet 5/325, 1-2 p.o. q.6 hours p.r.n. pain, dispensed #30.  She states she was taken this in the past without problems.     Betha Loa, MD     KK/MEDQ  D:  03/02/2013  T:  03/03/2013  Job:  [id number]

## 2013-03-11 IMAGING — CR DG CHEST 2V
2 series · 2 of 2 positions shown · non-contrast
Comparison: Portable chest x-ray 02/10/2011.  Two-view chest x-ray
02/07/2011.

CLINICAL DATA: Recent hospitalization for pneumonia, discharged 2
days ago.  The patient presents today with worsening cough and
shortness of breath.

CHEST - 2 VIEW 02/18/2011:

[w chest pa]
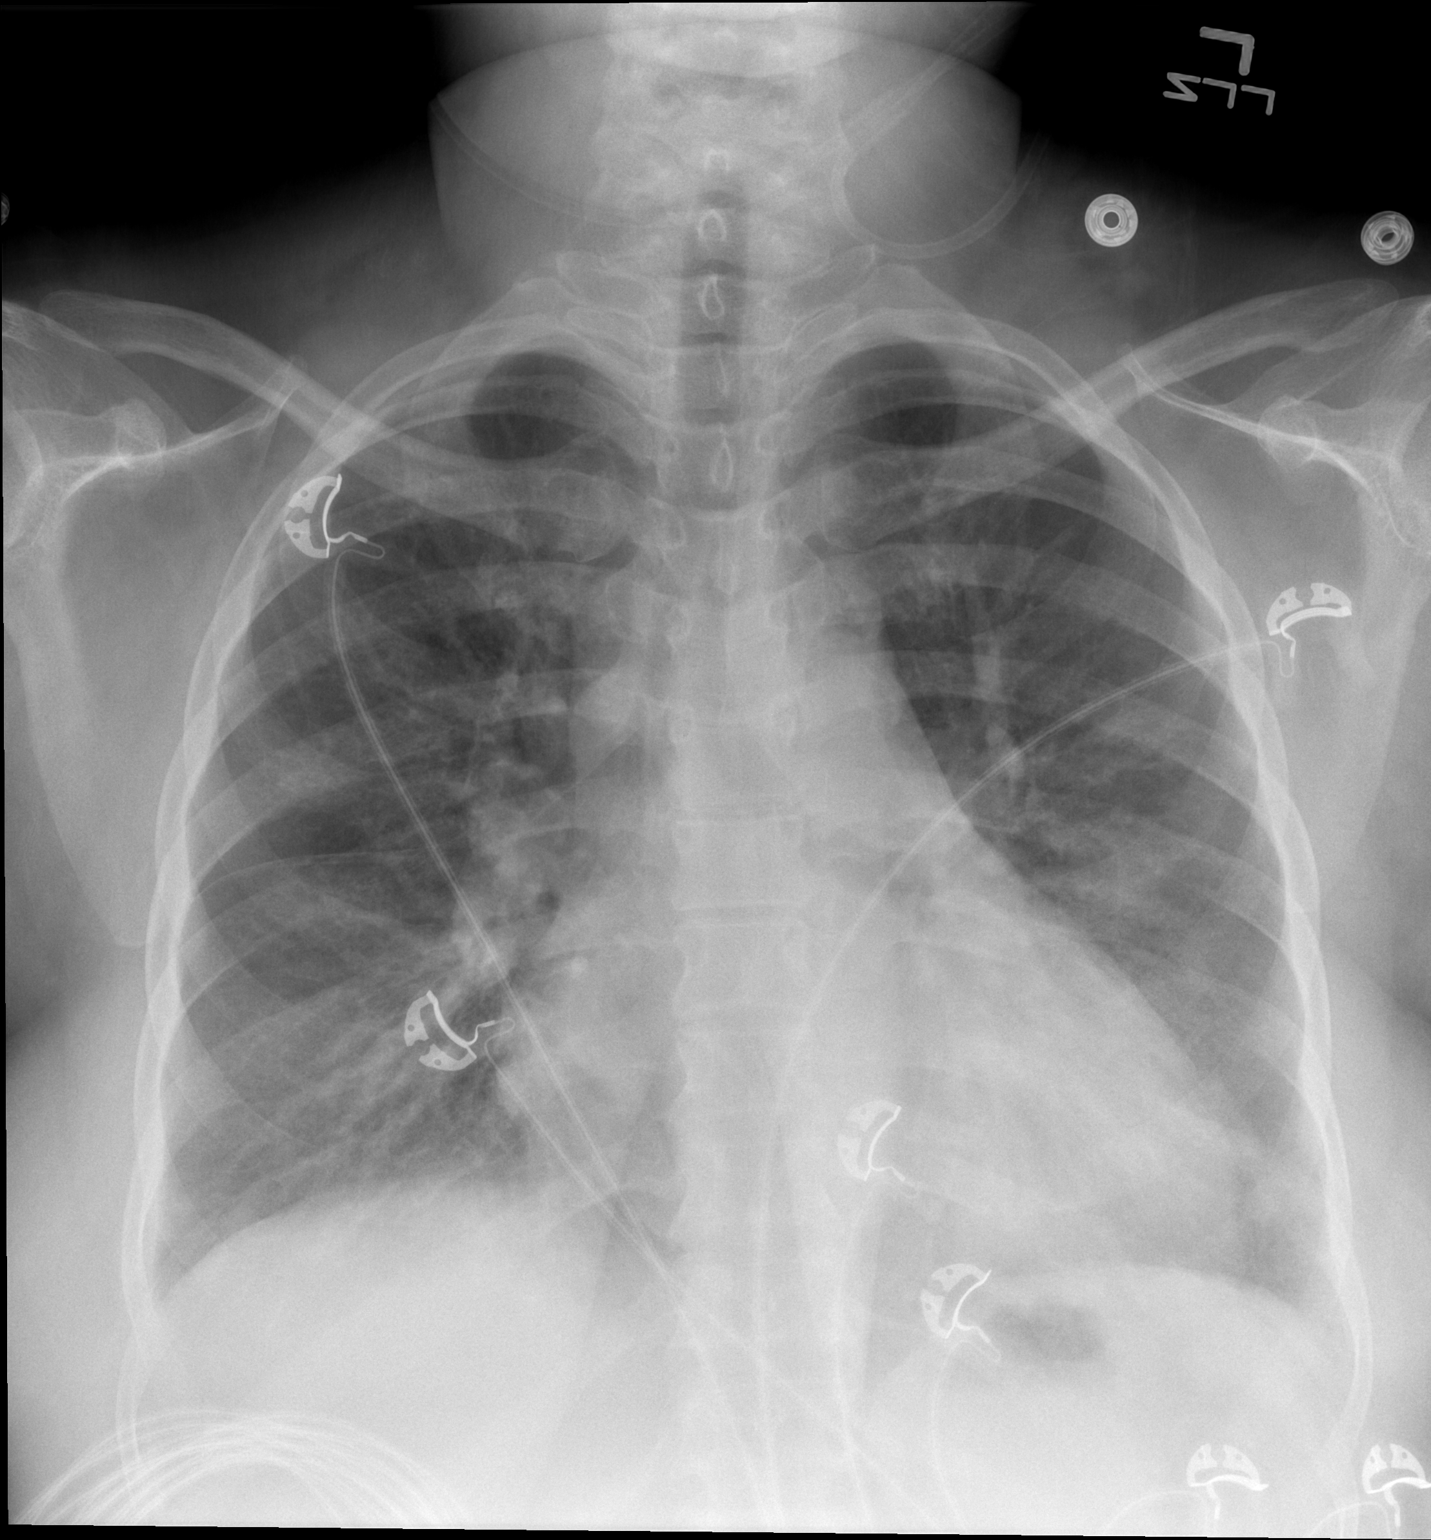

[w chest lat]
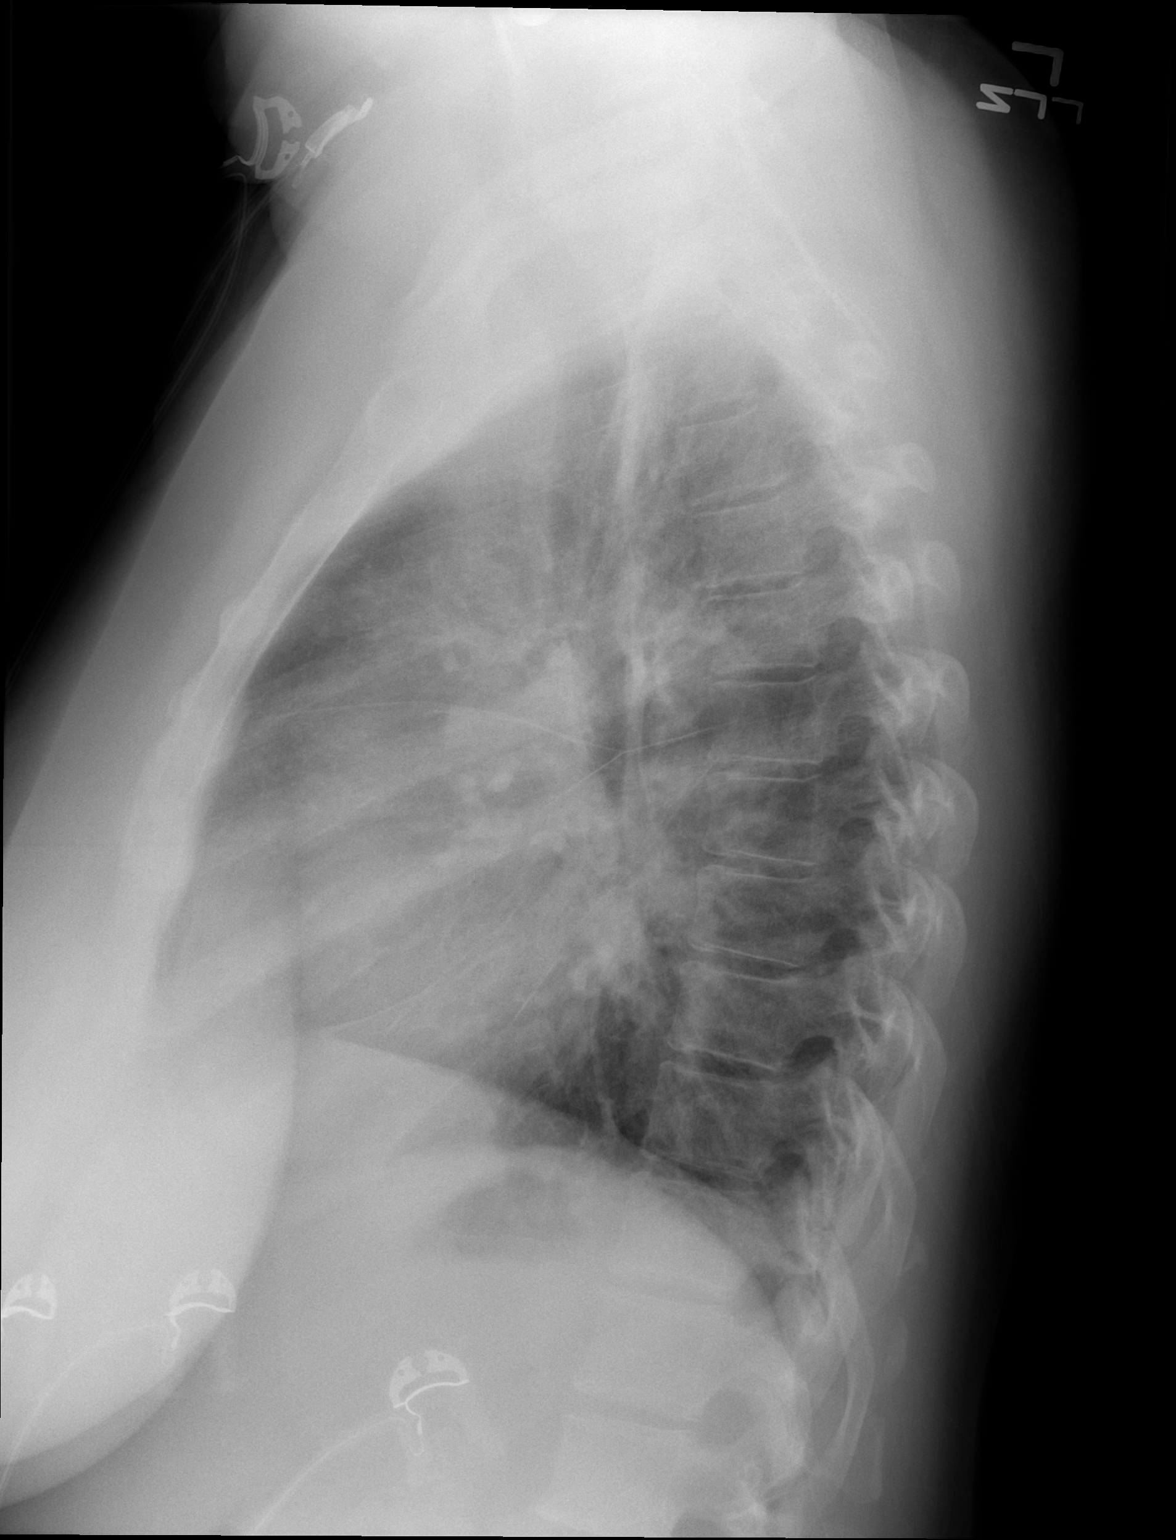

[2 of 2 positions shown; findings below may reference images not displayed]

FINDINGS: Cardiac silhouette mildly enlarged but stable.  Hilar and
mediastinal contours otherwise unremarkable.  Interval marked
improvement in the airspace consolidation in the lower lobes, with
only mild patchy airspace opacities persisting.  Mild pulmonary
venous hypertension without overt edema.  No pleural effusions.
Visualized bony thorax intact.
IMPRESSION: Near complete resolution of the previously identified pneumonia in
the lower lobes, with mild patchy airspace opacities persisting.
No new abnormalities.  Stable mild cardiomegaly and mild pulmonary
venous hypertension without overt edema.

## 2013-03-11 IMAGING — XA IR FLUORO GUIDE CV LINE*R*
1 series · 2 of 2 positions shown · non-contrast
Comparison: none

CLINICAL DATA: Poor venous access.  Hyperglycemia.  Needs IV
access.  Multiple known central venous occlusions.  On
hemodialysis.

[Series 1: run · 2 of 2 slices shown]
[im 1/2]
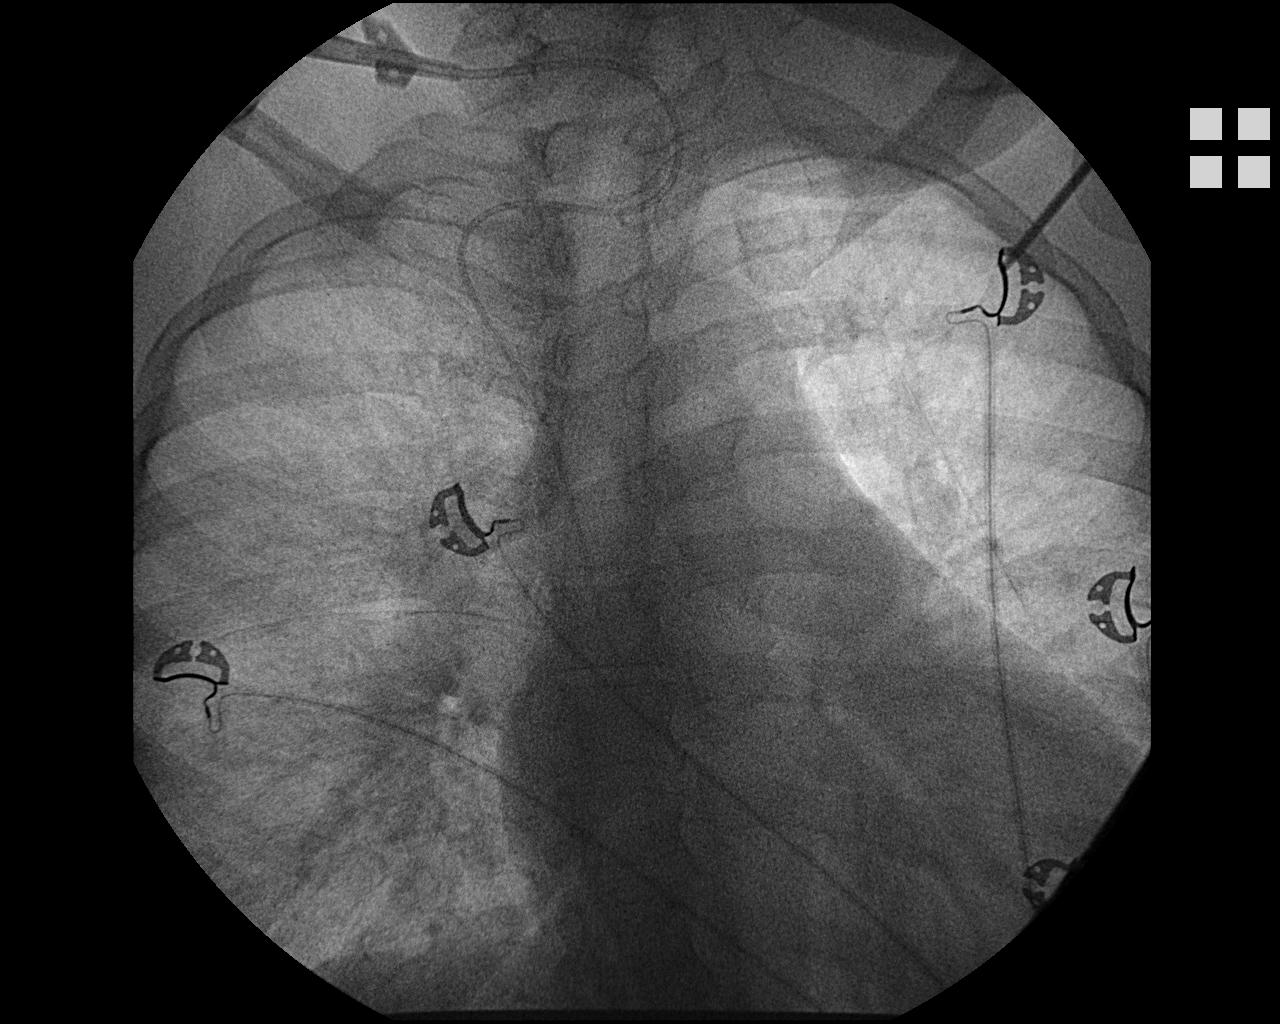
[im 2/2]
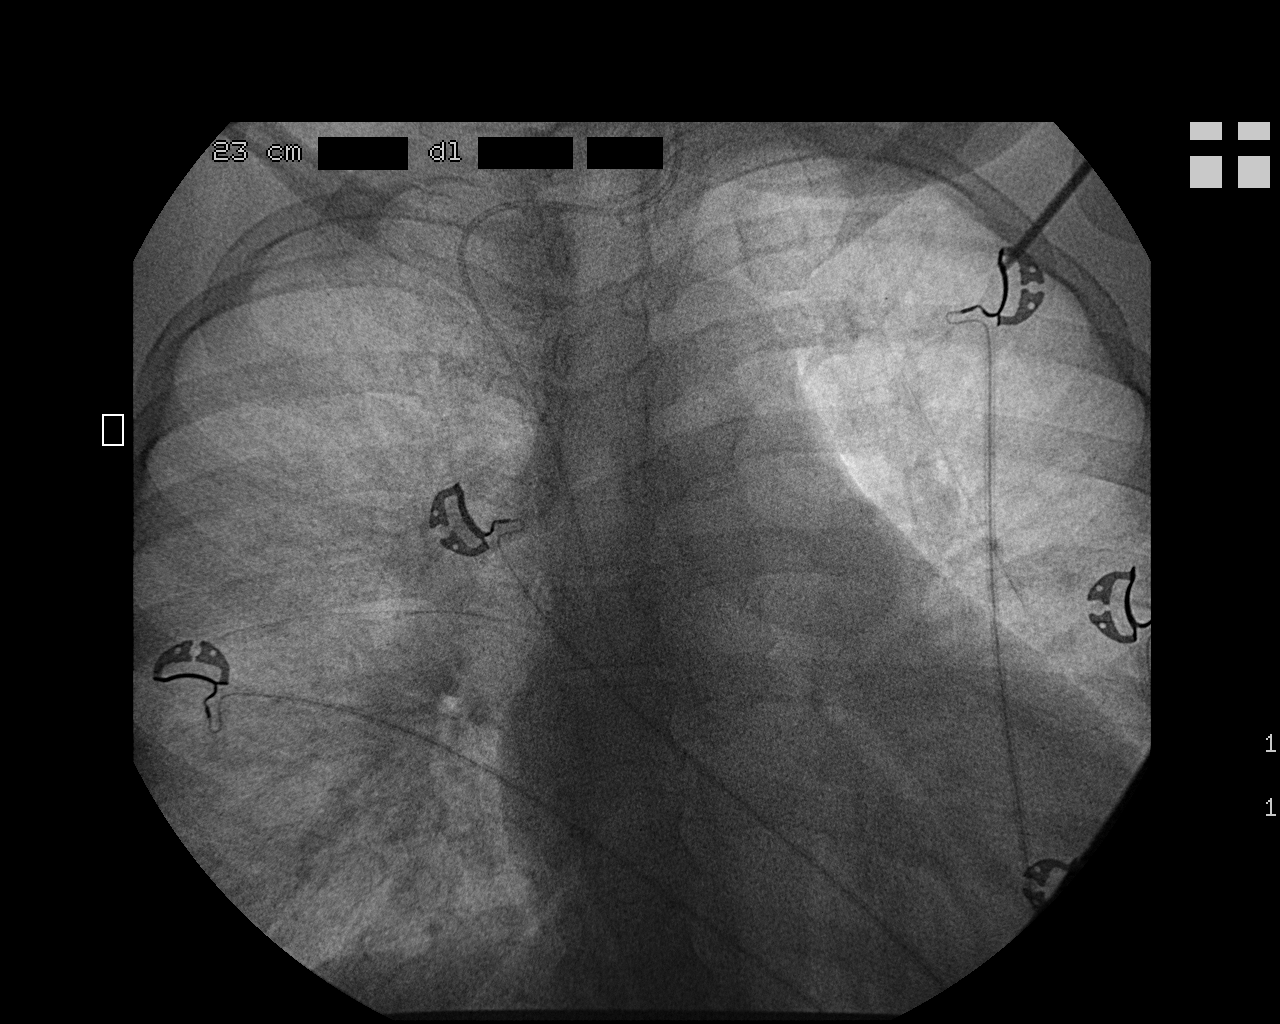

[2 of 2 positions shown; findings below may reference images not displayed]

RIGHT IJ CENTRAL VENOUS CATHETER PLACEMENT UNDER ULTRASOUND AND
FLUOROSCOPIC GUIDANCE:

Technique and findings:  Patency of the proximal right IJ vein was
confirmed with ultrasound with image documentation. The vein
appeared attenuated or occluded more centrally just above the
thoracic inlet and a large collateral across the midline was noted.
An appropriate skin site was determined. Skin site was marked.
Region was prepped using maximum barrier technique including cap
and mask, sterile gown, sterile gloves, large sterile sheet, and
Chlorhexidine   as cutaneous antisepsis.  The region was
infiltrated locally with 1% lidocaine.   Under real-time ultrasound
guidance, the right IJ vein was accessed with a 21 gauge
micropuncture needle; the needle tip within the vein was confirmed
with ultrasound image documentation.  The  needle exchanged over a
guidewire for peel-away
sheath through which an 23cm 5 French PowerPICC dual lumen catheter
was
advanced. This was positioned with the tip at the cavoatrial
junction. Spot
chest radiograph shows good positioning and no pneumothorax.
Catheter was flushed
and sutured externally with 0-Prolene sutures. Patient tolerated
the procedure
well, with no immediate complication.
IMPRESSION: 1. Technically successful right IJ double lumen power injectable
central venous catheter placement to the cavoatrial junction via a
prominent collateral channel

## 2013-03-16 ENCOUNTER — Emergency Department (HOSPITAL_COMMUNITY): Payer: Medicare Other

## 2013-03-16 ENCOUNTER — Encounter (HOSPITAL_COMMUNITY): Payer: Self-pay | Admitting: Emergency Medicine

## 2013-03-16 ENCOUNTER — Other Ambulatory Visit: Payer: Self-pay

## 2013-03-16 ENCOUNTER — Inpatient Hospital Stay (HOSPITAL_COMMUNITY)
Admission: EM | Admit: 2013-03-16 | Discharge: 2013-03-20 | DRG: 391 | Disposition: A | Discharge status: Home / Self Care | Payer: Medicare Other | Attending: Internal Medicine | Admitting: Internal Medicine

## 2013-03-16 DIAGNOSIS — N2581 Secondary hyperparathyroidism of renal origin: Secondary | ICD-10-CM | POA: Diagnosis present

## 2013-03-16 DIAGNOSIS — E11319 Type 2 diabetes mellitus with unspecified diabetic retinopathy without macular edema: Secondary | ICD-10-CM | POA: Diagnosis present

## 2013-03-16 DIAGNOSIS — Z9483 Pancreas transplant status: Secondary | ICD-10-CM

## 2013-03-16 DIAGNOSIS — J45909 Unspecified asthma, uncomplicated: Secondary | ICD-10-CM | POA: Diagnosis present

## 2013-03-16 DIAGNOSIS — R109 Unspecified abdominal pain: Secondary | ICD-10-CM | POA: Diagnosis present

## 2013-03-16 DIAGNOSIS — E669 Obesity, unspecified: Secondary | ICD-10-CM | POA: Diagnosis present

## 2013-03-16 DIAGNOSIS — E785 Hyperlipidemia, unspecified: Secondary | ICD-10-CM | POA: Diagnosis present

## 2013-03-16 DIAGNOSIS — K529 Noninfective gastroenteritis and colitis, unspecified: Secondary | ICD-10-CM

## 2013-03-16 DIAGNOSIS — N186 End stage renal disease: Secondary | ICD-10-CM | POA: Diagnosis present

## 2013-03-16 DIAGNOSIS — N189 Chronic kidney disease, unspecified: Secondary | ICD-10-CM

## 2013-03-16 DIAGNOSIS — R188 Other ascites: Secondary | ICD-10-CM | POA: Diagnosis not present

## 2013-03-16 DIAGNOSIS — E870 Hyperosmolality and hypernatremia: Secondary | ICD-10-CM | POA: Diagnosis present

## 2013-03-16 DIAGNOSIS — Z940 Kidney transplant status: Secondary | ICD-10-CM

## 2013-03-16 DIAGNOSIS — K219 Gastro-esophageal reflux disease without esophagitis: Secondary | ICD-10-CM | POA: Diagnosis present

## 2013-03-16 DIAGNOSIS — I120 Hypertensive chronic kidney disease with stage 5 chronic kidney disease or end stage renal disease: Secondary | ICD-10-CM | POA: Diagnosis present

## 2013-03-16 DIAGNOSIS — D631 Anemia in chronic kidney disease: Secondary | ICD-10-CM | POA: Diagnosis present

## 2013-03-16 DIAGNOSIS — G8929 Other chronic pain: Secondary | ICD-10-CM | POA: Diagnosis present

## 2013-03-16 DIAGNOSIS — K5289 Other specified noninfective gastroenteritis and colitis: Principal | ICD-10-CM | POA: Diagnosis present

## 2013-03-16 DIAGNOSIS — E1110 Type 2 diabetes mellitus with ketoacidosis without coma: Secondary | ICD-10-CM

## 2013-03-16 DIAGNOSIS — Z7982 Long term (current) use of aspirin: Secondary | ICD-10-CM

## 2013-03-16 DIAGNOSIS — E871 Hypo-osmolality and hyponatremia: Secondary | ICD-10-CM | POA: Diagnosis present

## 2013-03-16 DIAGNOSIS — Z992 Dependence on renal dialysis: Secondary | ICD-10-CM

## 2013-03-16 DIAGNOSIS — I729 Aneurysm of unspecified site: Secondary | ICD-10-CM | POA: Diagnosis present

## 2013-03-16 DIAGNOSIS — F329 Major depressive disorder, single episode, unspecified: Secondary | ICD-10-CM | POA: Diagnosis present

## 2013-03-16 DIAGNOSIS — F3289 Other specified depressive episodes: Secondary | ICD-10-CM | POA: Diagnosis present

## 2013-03-16 DIAGNOSIS — E1039 Type 1 diabetes mellitus with other diabetic ophthalmic complication: Secondary | ICD-10-CM | POA: Diagnosis present

## 2013-03-16 DIAGNOSIS — J438 Other emphysema: Secondary | ICD-10-CM | POA: Diagnosis present

## 2013-03-16 DIAGNOSIS — E875 Hyperkalemia: Secondary | ICD-10-CM | POA: Diagnosis present

## 2013-03-16 DIAGNOSIS — E1029 Type 1 diabetes mellitus with other diabetic kidney complication: Secondary | ICD-10-CM

## 2013-03-16 DIAGNOSIS — Z9225 Personal history of immunosupression therapy: Secondary | ICD-10-CM

## 2013-03-16 DIAGNOSIS — Z794 Long term (current) use of insulin: Secondary | ICD-10-CM

## 2013-03-16 LAB — BASIC METABOLIC PANEL
Chloride: 90 mEq/L — ABNORMAL LOW (ref 96–112)
Creatinine, Ser: 8.52 mg/dL — ABNORMAL HIGH (ref 0.50–1.10)
Creatinine, Ser: 8.75 mg/dL — ABNORMAL HIGH (ref 0.50–1.10)
GFR calc Af Amer: 6 mL/min — ABNORMAL LOW (ref >90)
GFR calc Af Amer: 6 mL/min — ABNORMAL LOW (ref >90)
GFR calc non Af Amer: 5 mL/min — ABNORMAL LOW (ref >90)
Potassium: 6.2 mEq/L — ABNORMAL HIGH (ref 3.5–5.1)
Potassium: 4.4 mEq/L (ref 3.5–5.1)
Sodium: 122 mEq/L — ABNORMAL LOW (ref 135–145)
Sodium: 129 mEq/L — ABNORMAL LOW (ref 135–145)

## 2013-03-16 LAB — CBC WITH DIFFERENTIAL/PLATELET
Basophils Absolute: 0 K/uL (ref 0.0–0.1)
Basophils Relative: 0 % (ref 0–1)
Eosinophils Absolute: 0.1 K/uL (ref 0.0–0.7)
Eosinophils Relative: 1 % (ref 0–5)
HCT: 34.3 % — ABNORMAL LOW (ref 36.0–46.0)
Hemoglobin: 10.2 g/dL — ABNORMAL LOW (ref 12.0–15.0)
Lymphocytes Relative: 11 % — ABNORMAL LOW (ref 12–46)
Lymphs Abs: 0.6 K/uL — ABNORMAL LOW (ref 0.7–4.0)
MCH: 33.4 pg (ref 26.0–34.0)
MCHC: 29.7 g/dL — ABNORMAL LOW (ref 30.0–36.0)
MCV: 112.5 fL — ABNORMAL HIGH (ref 78.0–100.0)
Monocytes Absolute: 0.2 K/uL (ref 0.1–1.0)
Monocytes Relative: 3 % (ref 3–12)
Neutro Abs: 4.6 K/uL (ref 1.7–7.7)
Neutrophils Relative %: 85 % — ABNORMAL HIGH (ref 43–77)
Platelets: 164 K/uL (ref 150–400)
RBC: 3.05 MIL/uL — ABNORMAL LOW (ref 3.87–5.11)
RDW: 17.1 % — ABNORMAL HIGH (ref 11.5–15.5)
WBC: 5.5 K/uL (ref 4.0–10.5)

## 2013-03-16 LAB — COMPREHENSIVE METABOLIC PANEL
ALT: 27 U/L (ref 0–35)
AST: 44 U/L — ABNORMAL HIGH (ref 0–37)
Albumin: 3.3 g/dL — ABNORMAL LOW (ref 3.5–5.2)
Alkaline Phosphatase: 156 U/L — ABNORMAL HIGH (ref 39–117)
CO2: 18 mEq/L — ABNORMAL LOW (ref 19–32)
Chloride: 81 mEq/L — ABNORMAL LOW (ref 96–112)
GFR calc non Af Amer: 5 mL/min — ABNORMAL LOW (ref >90)
Potassium: 5.7 mEq/L — ABNORMAL HIGH (ref 3.5–5.1)
Sodium: 120 mEq/L — ABNORMAL LOW (ref 135–145)
Total Bilirubin: 0.8 mg/dL (ref 0.3–1.2)

## 2013-03-16 LAB — CG4 I-STAT (LACTIC ACID): Lactic Acid, Venous: 1.97 mmol/L (ref 0.5–2.2)

## 2013-03-16 LAB — POCT I-STAT 3, VENOUS BLOOD GAS (G3P V)
Bicarbonate: 20.6 mEq/L (ref 20.0–24.0)
pCO2, Ven: 50.8 mmHg — ABNORMAL HIGH (ref 45.0–50.0)
pH, Ven: 7.215 — ABNORMAL LOW (ref 7.250–7.300)
pO2, Ven: 146 mmHg — ABNORMAL HIGH (ref 30.0–45.0)

## 2013-03-16 LAB — TROPONIN I: Troponin I: <0.3 ng/mL (ref <0.30)

## 2013-03-16 LAB — GLUCOSE, CAPILLARY: Glucose-Capillary: 80 mg/dL (ref 70–99)

## 2013-03-16 MED ORDER — DARBEPOETIN ALFA-POLYSORBATE 60 MCG/0.3ML IJ SOLN
60.0000 ug | INTRAMUSCULAR | Status: DC
  Administered 2013-03-17: 60 ug via INTRAVENOUS
  Filled 2013-03-16: qty 0.3

## 2013-03-16 MED ORDER — LIDOCAINE HCL (PF) 1 % IJ SOLN
5.0000 mL | INTRAMUSCULAR | Status: DC | PRN
  Filled 2013-03-16: qty 5

## 2013-03-16 MED ORDER — TECHNETIUM TO 99M ALBUMIN AGGREGATED
6.0000 | Freq: Once | INTRAVENOUS | Status: AC | PRN
  Administered 2013-03-16: 6 via INTRAVENOUS

## 2013-03-16 MED ORDER — SODIUM CHLORIDE 0.9 % IV SOLN
INTRAVENOUS | Status: DC
  Administered 2013-03-16: 22:00:00 via INTRAVENOUS

## 2013-03-16 MED ORDER — ONDANSETRON HCL 4 MG/2ML IJ SOLN
4.0000 mg | Freq: Four times a day (QID) | INTRAMUSCULAR | Status: DC | PRN
  Administered 2013-03-16 – 2013-03-17 (×2): 4 mg via INTRAVENOUS
  Filled 2013-03-16: qty 2

## 2013-03-16 MED ORDER — SODIUM CHLORIDE 0.9 % IJ SOLN: 3.0000 mL | INTRAMUSCULAR | Status: DC | PRN

## 2013-03-16 MED ORDER — PREDNISONE 5 MG PO TABS
5.0000 mg | ORAL_TABLET | Freq: Every day | ORAL | Status: DC
  Administered 2013-03-16 – 2013-03-20 (×5): 5 mg via ORAL
  Filled 2013-03-16 (×5): qty 1

## 2013-03-16 MED ORDER — DEXTROSE-NACL 5-0.45 % IV SOLN: INTRAVENOUS | Status: DC

## 2013-03-16 MED ORDER — DARBEPOETIN ALFA-POLYSORBATE 60 MCG/0.3ML IJ SOLN: 60.0000 ug | INTRAMUSCULAR | Status: DC

## 2013-03-16 MED ORDER — HYDROMORPHONE HCL PF 1 MG/ML IJ SOLN
0.5000 mg | INTRAMUSCULAR | Status: AC | PRN
  Administered 2013-03-16 – 2013-03-17 (×3): 0.5 mg via INTRAVENOUS
  Filled 2013-03-16 (×2): qty 1

## 2013-03-16 MED ORDER — DOXERCALCIFEROL 4 MCG/2ML IV SOLN
1.0000 ug | INTRAVENOUS | Status: DC
  Administered 2013-03-17 – 2013-03-19 (×2): 1 ug via INTRAVENOUS
  Filled 2013-03-16 (×2): qty 2

## 2013-03-16 MED ORDER — PROMETHAZINE HCL 25 MG/ML IJ SOLN
25.0000 mg | Freq: Once | INTRAMUSCULAR | Status: AC
Start: 2013-03-16 — End: 2013-03-16
  Administered 2013-03-16: 25 mg via INTRAVENOUS
  Filled 2013-03-16: qty 1

## 2013-03-16 MED ORDER — ONDANSETRON HCL 4 MG/2ML IJ SOLN
INTRAMUSCULAR | Status: AC
  Administered 2013-03-16: 4 mg via INTRAVENOUS
  Filled 2013-03-16: qty 2

## 2013-03-16 MED ORDER — SODIUM CHLORIDE 0.9 % IV SOLN: 250.0000 mL | INTRAVENOUS | Status: DC | PRN

## 2013-03-16 MED ORDER — IOHEXOL 300 MG/ML  SOLN
25.0000 mL | INTRAMUSCULAR | Status: AC
  Administered 2013-03-16: 25 mL via ORAL

## 2013-03-16 MED ORDER — ALBUTEROL SULFATE (5 MG/ML) 0.5% IN NEBU: 2.5000 mg | INHALATION_SOLUTION | RESPIRATORY_TRACT | Status: DC | PRN

## 2013-03-16 MED ORDER — MYCOPHENOLATE MOFETIL 250 MG PO CAPS
250.0000 mg | ORAL_CAPSULE | Freq: Two times a day (BID) | ORAL | Status: DC
  Administered 2013-03-16 – 2013-03-20 (×8): 250 mg via ORAL
  Filled 2013-03-16 (×9): qty 1

## 2013-03-16 MED ORDER — ONDANSETRON HCL 4 MG/2ML IJ SOLN
4.0000 mg | Freq: Once | INTRAMUSCULAR | Status: AC
  Administered 2013-03-16: 4 mg via INTRAVENOUS
  Filled 2013-03-16: qty 2

## 2013-03-16 MED ORDER — NEPRO/CARBSTEADY PO LIQD
237.0000 mL | ORAL | Status: DC | PRN
  Filled 2013-03-16: qty 237

## 2013-03-16 MED ORDER — DOXERCALCIFEROL 4 MCG/2ML IV SOLN: 1.0000 ug | INTRAVENOUS | Status: DC

## 2013-03-16 MED ORDER — SODIUM CHLORIDE 0.9 % IJ SOLN
3.0000 mL | Freq: Two times a day (BID) | INTRAMUSCULAR | Status: DC
  Administered 2013-03-16: 3 mL via INTRAVENOUS

## 2013-03-16 MED ORDER — CLONAZEPAM 1 MG PO TABS
1.0000 mg | ORAL_TABLET | Freq: Every day | ORAL | Status: DC
  Administered 2013-03-16 – 2013-03-19 (×4): 1 mg via ORAL
  Filled 2013-03-16 (×2): qty 1
  Filled 2013-03-16: qty 2
  Filled 2013-03-16: qty 1

## 2013-03-16 MED ORDER — SODIUM CHLORIDE 0.9 % IJ SOLN
3.0000 mL | Freq: Two times a day (BID) | INTRAMUSCULAR | Status: DC
  Administered 2013-03-16 – 2013-03-18 (×3): 3 mL via INTRAVENOUS

## 2013-03-16 MED ORDER — SODIUM CHLORIDE 0.9 % IV SOLN
INTRAVENOUS | Status: DC
  Administered 2013-03-16: 5.4 [IU]/h via INTRAVENOUS
  Filled 2013-03-16: qty 1

## 2013-03-16 MED ORDER — HEPARIN SODIUM (PORCINE) 5000 UNIT/ML IJ SOLN
5000.0000 [IU] | Freq: Three times a day (TID) | INTRAMUSCULAR | Status: DC
  Filled 2013-03-16 (×14): qty 1

## 2013-03-16 MED ORDER — HEPARIN SODIUM (PORCINE) 1000 UNIT/ML DIALYSIS
5000.0000 [IU] | Freq: Once | INTRAMUSCULAR | Status: DC
Start: 2013-03-16 — End: 2013-03-20

## 2013-03-16 MED ORDER — SODIUM CHLORIDE 0.9 % IV SOLN
INTRAVENOUS | Status: DC
  Filled 2013-03-16: qty 1

## 2013-03-16 MED ORDER — ALTEPLASE 2 MG IJ SOLR: 2.0000 mg | Freq: Once | INTRAMUSCULAR | Status: AC | PRN

## 2013-03-16 MED ORDER — ONDANSETRON HCL 4 MG PO TABS: 4.0000 mg | ORAL_TABLET | Freq: Four times a day (QID) | ORAL | Status: DC | PRN

## 2013-03-16 MED ORDER — GI COCKTAIL ~~LOC~~
30.0000 mL | Freq: Once | ORAL | Status: AC
  Administered 2013-03-16: 30 mL via ORAL
  Filled 2013-03-16: qty 30

## 2013-03-16 MED ORDER — HYDROMORPHONE HCL PF 1 MG/ML IJ SOLN
0.5000 mg | Freq: Once | INTRAMUSCULAR | Status: AC
  Administered 2013-03-16: 0.5 mg via INTRAVENOUS

## 2013-03-16 MED ORDER — CIPROFLOXACIN IN D5W 400 MG/200ML IV SOLN
400.0000 mg | INTRAVENOUS | Status: DC
  Administered 2013-03-16 – 2013-03-19 (×4): 400 mg via INTRAVENOUS
  Filled 2013-03-16 (×6): qty 200

## 2013-03-16 MED ORDER — HEPARIN SODIUM (PORCINE) 1000 UNIT/ML DIALYSIS: 1000.0000 [IU] | INTRAMUSCULAR | Status: DC | PRN

## 2013-03-16 MED ORDER — INSULIN REGULAR BOLUS VIA INFUSION
0.0000 [IU] | Freq: Three times a day (TID) | INTRAVENOUS | Status: DC
  Filled 2013-03-16: qty 10

## 2013-03-16 MED ORDER — GABAPENTIN 100 MG PO CAPS
100.0000 mg | ORAL_CAPSULE | Freq: Every day | ORAL | Status: DC
  Administered 2013-03-17 – 2013-03-20 (×4): 100 mg via ORAL
  Filled 2013-03-16 (×4): qty 1

## 2013-03-16 MED ORDER — DEXTROSE 50 % IV SOLN: 25.0000 mL | INTRAVENOUS | Status: DC | PRN

## 2013-03-16 MED ORDER — LIDOCAINE-PRILOCAINE 2.5-2.5 % EX CREA: 1.0000 "application " | TOPICAL_CREAM | CUTANEOUS | Status: DC | PRN

## 2013-03-16 MED ORDER — ALBUTEROL SULFATE (5 MG/ML) 0.5% IN NEBU
2.5000 mg | INHALATION_SOLUTION | Freq: Four times a day (QID) | RESPIRATORY_TRACT | Status: DC
  Administered 2013-03-17 (×3): 2.5 mg via RESPIRATORY_TRACT
  Filled 2013-03-16 (×4): qty 0.5

## 2013-03-16 MED ORDER — PENTAFLUOROPROP-TETRAFLUOROETH EX AERO: 1.0000 "application " | INHALATION_SPRAY | CUTANEOUS | Status: DC | PRN

## 2013-03-16 MED ORDER — SODIUM CHLORIDE 0.9 % IV SOLN: 100.0000 mL | INTRAVENOUS | Status: DC | PRN

## 2013-03-16 MED ORDER — HYDROMORPHONE HCL PF 1 MG/ML IJ SOLN
0.5000 mg | Freq: Once | INTRAMUSCULAR | Status: AC
  Administered 2013-03-16: 0.5 mg via INTRAVENOUS
  Filled 2013-03-16: qty 1

## 2013-03-16 MED ORDER — TECHNETIUM TC 99M DIETHYLENETRIAME-PENTAACETIC ACID: 40.0000 | Freq: Once | INTRAVENOUS | Status: DC | PRN

## 2013-03-16 MED ORDER — METRONIDAZOLE IN NACL 5-0.79 MG/ML-% IV SOLN
500.0000 mg | Freq: Three times a day (TID) | INTRAVENOUS | Status: DC
  Administered 2013-03-16 – 2013-03-20 (×11): 500 mg via INTRAVENOUS
  Filled 2013-03-16 (×15): qty 100

## 2013-03-16 MED ORDER — IOHEXOL 300 MG/ML  SOLN
100.0000 mL | Freq: Once | INTRAMUSCULAR | Status: AC | PRN
  Administered 2013-03-16: 100 mL via INTRAVENOUS

## 2013-03-16 MED ORDER — SODIUM CHLORIDE 0.9 % IV BOLUS (SEPSIS)
500.0000 mL | Freq: Once | INTRAVENOUS | Status: AC
  Administered 2013-03-16: 500 mL via INTRAVENOUS

## 2013-03-16 NOTE — ED Notes (Signed)
Pt states difficult IV access. Has had EJs and Chest IVs, States EDP usually starts them. Dr. Rhunette Croft informed.

## 2013-03-16 NOTE — ED Provider Notes (Addendum)
CSN: [id number]     Arrival date & time 03/16/13  1003 History   First MD Initiated Contact with Patient 03/16/13 1005     Chief Complaint  Patient presents with  . Shortness of Breath  . Abdominal Pain   (Consider location/radiation/quality/duration/timing/severity/associated sxs/prior Treatment) HPI Comments: Pt with hx of ESRD on dialysis (t/tr/sat), transplant hx on tacro, prednisone, COPD, DM comes in with cc of abd pain and dib. Pt states that she is always short of breath, due to her bronchitis - may be her dib is a little worse over the past 2 days. Last dialysis was on Sat, and she has no chest pain. + non productive cough, no hx of PE, DVT and no fevers.  Pt also having abd pain - diffuse, but worse over the epigastrium and RUQ. Pt's pain started y'day, and is sharp, non radiating. + nausea, with emesis x 1, non bilious and non bloody. Pt has no diarrhea, last BM was today. Pt is s/p cholecystectomy. The pain is constant.   Patient is a 42 y.o. female presenting with shortness of breath and abdominal pain. The history is provided by the patient and medical records.  Shortness of Breath Associated symptoms: abdominal pain, cough and vomiting   Associated symptoms: no chest pain, no neck pain, no rash and no wheezing   Abdominal Pain Associated symptoms: cough, nausea, shortness of breath and vomiting   Associated symptoms: no chest pain, no constipation, no diarrhea, no hematuria and no vaginal bleeding     Past Medical History  Diagnosis Date  . Hyperlipidemia   . Alopecia   . Obesity   . Iron deficiency   . 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 RLS (restless legs syndrome)   . Respiratory failure   . Injury of conjunctiva and corneal abrasion of right eye without foreign body   . Cellulitis   . Dialysis patient     Tues; Thurs; Sat; [place]  . Immunosuppression 08/01/11    currently takes Prednisone, MMF,  and tacrolimus   . Anemia   . Pneumonia 2012    "double"  . Blood transfusion 2004    "when I had my transplant"  . Migraine   . 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 Hyperparathyroidism, secondary   . Diabetic retinopathy   . Hypertension     takes Metoprolol and Exforge daily  . Depression     takes Klonopin nightly  . Diabetes mellitus type 1     Pt states diagnosed at age 42 with prior episodes of DKA. S/p pancreatic transplant   . GERD (gastroesophageal reflux disease)     takes Protonix daily  . PONV (postoperative nausea and vomiting)   . Bronchitis   . Migraine   . Asthma   . Shortness of breath   . Emphysema of lung   . DKA (diabetic ketoacidoses) 07/02/2012  . Angina     none in past 2 years.   Past Surgical History  Procedure Laterality Date  . 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 Thyroglobulin      x 7  . Av fistula placement      right upper arm  . Cholecystectomy  1995  .  hd graft placement/removal      "had 2 in my left upper arm"  . Eye surgery    .  Retinopathy surgery    . Tooth extraction  10/10/11    X's two  . Insertion of dialysis catheter  12/08/2011    Procedure: INSERTION OF DIALYSIS CATHETER;  Surgeon: Chuck Hint, MD;  Location: [facility] OR;  Service: Vascular;  Laterality: Left;  . Av fistula placement  01/22/2012    Procedure: INSERTION OF ARTERIOVENOUS (AV) GORE-TEX GRAFT ARM;  Surgeon: Chuck Hint, MD;  Location: [facility] OR;  Service: Vascular;  Laterality: Left;  Ultrasound guided.  . Av fistula placement  03/14/2012    Procedure: INSERTION OF ARTERIOVENOUS (AV) GORE-TEX GRAFT THIGH;  Surgeon: Chuck Hint, MD;  Location: [facility] OR;  Service: Vascular;  Laterality: Right;  . False aneurysm repair Right 01/02/2013    Procedure: Excision of lymphocele in right thigh.;  Surgeon: Chuck Hint, MD;  Location: [facility] OR;  Service: Vascular;  Laterality: Right;   . Carpal tunnel release Right 03/02/2013    Procedure: CARPAL TUNNEL RELEASE;  Surgeon: Tami Ribas, MD;  Location: [facility];  Service: Orthopedics;  Laterality: Right;   Family History  Problem Relation Age of Onset  . Hypertension Mother   . Kidney disease Mother   . Diabetes Mother   . Hypertension Father   . Kidney disease Father   . Diabetes Father    History  Substance Use Topics  . Smoking status: Never Smoker   . Smokeless tobacco: Never Used  . Alcohol Use: No   OB History   Grav Para Term Preterm Abortions TAB SAB Ect Mult Living                 Review of Systems  Constitutional: Negative for activity change.  HENT: Negative for facial swelling.   Respiratory: Positive for cough and shortness of breath. Negative for chest tightness and wheezing.   Cardiovascular: Negative for chest pain.  Gastrointestinal: Positive for nausea, vomiting and abdominal pain. Negative for diarrhea, constipation, blood in stool and abdominal distention.  Genitourinary: Negative for hematuria, vaginal bleeding and difficulty urinating.  Musculoskeletal: Negative for neck pain.  Skin: Negative for color change and rash.  Allergic/Immunologic: Positive for immunocompromised state.  Neurological: Negative for speech difficulty.  Hematological: Does not bruise/bleed easily.  Psychiatric/Behavioral: Negative for confusion.    Allergies  Morphine and related; Penicillins; Tramadol; Depakote; and Vicodin  Home Medications   Current Outpatient Rx  Name  Route  Sig  Dispense  Refill  . albuterol (PROVENTIL HFA;VENTOLIN HFA) 108 (90 BASE) MCG/ACT inhaler   Inhalation   Inhale 2 puffs into the lungs every 4 (four) hours as needed. For wheezing.          Marland Kitchen albuterol (PROVENTIL HFA;VENTOLIN HFA) 108 (90 BASE) MCG/ACT inhaler   Inhalation   Inhale 2 puffs into the lungs every 4 (four) hours as needed for wheezing or shortness of breath (as per home regimen).   1  Inhaler   0   . aspirin EC 81 MG tablet   Oral   Take 81 mg by mouth daily.         . calcium acetate (PHOSLO) 667 MG capsule   Oral   Take 2,001-2,668 mg by mouth 3 (three) times daily with meals. Dosage depends on meal consumed         . clonazePAM (KLONOPIN) 0.5 MG tablet   Oral   Take 1 mg by mouth at bedtime.          Marland Kitchen dextromethorphan-guaiFENesin (MUCINEX DM) 30-600 MG per 12  hr tablet   Oral   Take 1 tablet by mouth 2 (two) times daily.   20 tablet   0   . diphenhydrAMINE (BENADRYL) 50 MG tablet   Oral   Take 50 mg by mouth at bedtime as needed for allergies.          Marland Kitchen EXFORGE 10-160 MG per tablet   Oral   Take 1 tablet by mouth at bedtime.          . famotidine (PEPCID) 20 MG tablet   Oral   Take 20 mg by mouth at bedtime.         . gabapentin (NEURONTIN) 100 MG capsule   Oral   Take 100 mg by mouth daily.         . Insulin Aspart (NOVOLOG FLEXPEN [place])   Subcutaneous   Inject 4 Units into the skin 2 (two) times daily. Takes 4 units in am and at night, if pt eats lunch she will take insulin before she eats         . insulin glargine (LANTUS) 100 units/mL SOLN   Subcutaneous   Inject 5 Units into the skin 2 (two) times daily.         Marland Kitchen levofloxacin (LEVAQUIN) 500 MG tablet   Oral   Take 1 tablet (500 mg total) by mouth daily.   4 tablet   0   . metoprolol succinate (TOPROL-XL) 100 MG 24 hr tablet   Oral   Take 100 mg by mouth daily. Take with or immediately following a meal.         . multivitamin (RENA-VIT) TABS tablet   Oral   Take 1 tablet by mouth daily.         . mycophenolate (CELLCEPT) 250 MG capsule   Oral   Take 250 mg by mouth 2 (two) times daily.         Marland Kitchen oxyCODONE-acetaminophen (PERCOCET) 5-325 MG per tablet      1-2 tabs po q6 hours prn pain   30 tablet   0   . pantoprazole (PROTONIX) 40 MG tablet   Oral   Take 40 mg by mouth daily.         . predniSONE (DELTASONE) 5 MG tablet   Oral   Take 5 mg  by mouth daily.          . tacrolimus (PROGRAF) 1 MG capsule   Oral   Take 1 mg by mouth 2 (two) times daily.         Marland Kitchen zolpidem (AMBIEN CR) 12.5 MG CR tablet   Oral   Take 12.5 mg by mouth at bedtime as needed for sleep.          BP 157/109  Pulse 77  Temp(Src) 98.2 F (36.8 C) (Oral)  Resp 20  SpO2 85%  LMP 02/16/2013 Physical Exam  Nursing note and vitals reviewed. Constitutional: She is oriented to person, place, and time. She appears well-developed and well-nourished.  HENT:  Head: Normocephalic and atraumatic.  Eyes: EOM are normal. Pupils are equal, round, and reactive to light.  Neck: Neck supple.  Cardiovascular: Normal rate, regular rhythm and normal heart sounds.   No murmur heard. Pulmonary/Chest: Effort normal. No respiratory distress.  Abdominal: Soft. She exhibits no distension and no mass. There is tenderness. There is no rebound and no guarding.  Pt has epigastric, RUQ and LUQ tenderness, with RUQ being the most tender. No CVA tenderness  Neurological: She is alert and  oriented to person, place, and time.  Skin: Skin is warm and dry.    ED Course  Procedures (including critical care time) Labs Review Labs Reviewed  CBC WITH DIFFERENTIAL  COMPREHENSIVE METABOLIC PANEL  TROPONIN I  LIPASE, BLOOD  D-DIMER, QUANTITATIVE   Imaging Review No results found.  EKG Interpretation   None       MDM  No diagnosis found.   Date: 03/16/2013  Rate: 77  Rhythm: normal sinus rhythm  QRS Axis: normal  Intervals: QT prolonged  ST/T Wave abnormalities: nonspecific ST/T changes  Conduction Disutrbances:none  Narrative Interpretation:   Old EKG Reviewed: unchanged  Pt comes in with cc of abd pain, n/v/headaches, dib. Hx of ESRD, DM. CBG - ELEVATED Hx of DKA, so possible DKA induced pain. H/A - unchanged, has hxof them, and there is no neuro deficits, nor are they severe or with meningeal signs. Abd pain - Will get CT. She has right sided strain  on her ekg, and has been having dib for a while, and i suspect if PE is possible. Dimer ordered - and the pre-test probability for PE is low as the primary diagnosis (she is PERC neg), so we will only het CT PE if the dimer significantly elevated .  Angiocath insertion Performed by: Derwood Kaplan  Consent: Verbal consent obtained. Risks and benefits: risks, benefits and alternatives were discussed Time out: Immediately prior to procedure a "time out" was called to verify the correct patient, procedure, equipment, support staff and site/side marked as required.  Preparation: Patient was prepped and draped in the usual sterile fashion.  Vein Location: Left EJ  Ultrasound Guided  Gauge: 20 gauge  Normal blood return and flush without difficulty Patient tolerance: Patient tolerated the procedure well with no immediate complications.      1:35 PM Pt's blood glucose is > 1000. Anion Gap is 21, uncorrected sodium. Will start insulin gtt. CT PE ordered.  Derwood Kaplan, MD 03/16/13 1743  5:44 PM  Due to significant delay in all imaging in the ED, I have signed off the patient to the Hospitalist without having her CT results and VQ results back. Our team will also keep an eye on the results if they are back shortly, and notify Dr. Benjamine Mola if there is significant pathology.   Derwood Kaplan, MD 03/16/13 1746

## 2013-03-16 NOTE — Consult Note (Signed)
ANTIBIOTIC CONSULT NOTE - INITIAL  Pharmacy Consult for Cipro Indication: colitis  Allergies  Allergen Reactions  . Depakote [Divalproex Sodium] Other (See Comments)    Pt gets "delirious"  . Morphine And Related Nausea And Vomiting and Other (See Comments)    "makes me delirious"  . Penicillins Anaphylaxis  . Tramadol Nausea And Vomiting  . Vicodin [Hydrocodone-Acetaminophen] Itching and Rash   Vital Signs: Temp: 98.2 F (36.8 C) (10/20 1003) Temp src: Oral (10/20 1003) BP: 143/44 mmHg (10/20 1319) Pulse Rate: 69 (10/20 1319) Intake/Output from previous day:   Intake/Output from this shift:    Labs:  Recent Labs  03/16/13 1145  WBC 5.5  HGB 10.2*  PLT 164  CREATININE 8.29*   Medical History: Past Medical History  Diagnosis Date  . Hyperlipidemia   . Alopecia   . Obesity   . Iron deficiency   . 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 RLS (restless legs syndrome)   . Respiratory failure   . Injury of conjunctiva and corneal abrasion of right eye without foreign body   . Cellulitis   . Dialysis patient     Tues; Thurs; Sat; [place]  . 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 Anemia   . Pneumonia 2012    "double"  . Blood transfusion 2004    "when I had my transplant"  . Migraine   . 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 Hyperparathyroidism, secondary   . Diabetic retinopathy   . Hypertension     takes Metoprolol and Exforge daily  . Depression     takes Klonopin nightly  . Diabetes mellitus type 1     Pt states diagnosed at age 61 with prior episodes of DKA. S/p pancreatic transplant   . GERD (gastroesophageal reflux disease)     takes Protonix daily  . PONV (postoperative nausea and vomiting)   . Bronchitis   . Migraine   . Asthma   . Shortness of breath   . Emphysema of lung   . DKA (diabetic ketoacidoses)  07/02/2012  . Angina     none in past 2 years.  . Renal insufficiency    Assessment: 42yof with ESRD s/p failed renal transplant on HD TTS presents to the ED with abdominal pain. CT scan showed diffuse colitis. She will begin cipro and flagyl. Last HD session was Saturday and plan is for her next one tomorrow.  Goal of Therapy:  Appropriate cipro dosing  Plan:  1) Cipro 400mg  IV q24  Fredrik Rigger 03/16/2013,7:00 PM

## 2013-03-16 NOTE — ED Notes (Signed)
Pt from home ,c/o sharp abd pain and sob w/strong cough. Pt states she has had cough for a week, states abd pain this am, emesis this morning around 6. Pt initial o2 sat 86% on RA. NSD

## 2013-03-16 NOTE — ED Notes (Signed)
IV team successfully placed 2nd line.

## 2013-03-16 NOTE — Progress Notes (Signed)
Unit CM UR Completed by [facility] ED CM  W. Caesar Mannella RN  

## 2013-03-16 NOTE — ED Notes (Signed)
Lab called to report critical lab value- CBG [id number]

## 2013-03-16 NOTE — Consult Note (Signed)
[facility] Renal Consultation Note    Indication for Consultation:  Management of ESRD/hemodialysis; anemia, hypertension/volume and secondary hyperparathyroidism  HPI: [name] is a 42 y.o. 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  She presents to the hospital with SOB, cough, abdominal pain and hx vomiting.  She denies fever, chills, CP.  She has no diarrhea or constipation. She had a similar presentation almost a month ago.  She had recent right CTR on right hand and is due for suture removal today; it has healed well without problems.  She has tried OTC mucinex to help her cough without relief.  She ran an uneventful HD tmt on Saturday with a post HD weight of 68 (edw 68.5). She is due for dialysis Tuesday.  Past Medical History  Diagnosis Date  . Hyperlipidemia   . Alopecia   . Obesity   . Iron deficiency   . 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 RLS (restless legs syndrome)   . Respiratory failure   . Injury of conjunctiva and corneal abrasion of right eye without foreign body   . Cellulitis   . Dialysis patient     Tues; Thurs; Sat; [place]  . 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 Anemia   . Pneumonia 2012    "double"  . Blood transfusion 2004    "when I had my transplant"  . Migraine   . 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 Hyperparathyroidism, secondary   . Diabetic retinopathy   . Hypertension     takes Metoprolol and Exforge daily  . Depression     takes Klonopin nightly  . Diabetes mellitus type 1     Pt states diagnosed at age 24 with prior episodes of DKA. S/p pancreatic transplant   . GERD (gastroesophageal reflux disease)     takes Protonix daily  . PONV  (postoperative nausea and vomiting)   . Bronchitis   . Migraine   . Asthma   . Shortness of breath   . Emphysema of lung   . DKA (diabetic ketoacidoses) 07/02/2012  . Angina     none in past 2 years.   Past Surgical History  Procedure Laterality Date  . 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 Thyroglobulin      x 7  . Av fistula placement      right upper arm  . Cholecystectomy  1995  .  hd graft placement/removal      "had 2 in my left upper arm"  . Eye surgery    . Retinopathy surgery    . Tooth extraction  10/10/11    X's two  . Insertion of dialysis catheter  12/08/2011    Procedure: INSERTION OF DIALYSIS CATHETER;  Surgeon: Chuck Hint, MD;  Location: [facility] OR;  Service: Vascular;  Laterality: Left;  . Av fistula placement  01/22/2012    Procedure: INSERTION OF ARTERIOVENOUS (AV) GORE-TEX GRAFT ARM;  Surgeon: Chuck Hint, MD;  Location: [facility] OR;  Service: Vascular;  Laterality: Left;  Ultrasound guided.  . Av fistula placement  03/14/2012    Procedure: INSERTION OF ARTERIOVENOUS (AV) GORE-TEX GRAFT THIGH;  Surgeon: Chuck Hint,  MD;  Location: [facility] OR;  Service: Vascular;  Laterality: Right;  . False aneurysm repair Right 01/02/2013    Procedure: Excision of lymphocele in right thigh.;  Surgeon: Chuck Hint, MD;  Location: [facility] OR;  Service: Vascular;  Laterality: Right;  . Carpal tunnel release Right 03/02/2013    Procedure: CARPAL TUNNEL RELEASE;  Surgeon: Tami Ribas, MD;  Location: [facility];  Service: Orthopedics;  Laterality: Right;   Family History  Problem Relation Age of Onset  . Hypertension Mother   . Kidney disease Mother   . Diabetes Mother   . Hypertension Father   . Kidney disease Father   . Diabetes Father    Social History:  reports that she has never smoked. She has never used smokeless tobacco. She reports that she does not drink alcohol or use illicit drugs. Allergies   Allergen Reactions  . Morphine And Related Nausea And Vomiting and Other (See Comments)    "makes me delerirous"  . Penicillins Anaphylaxis  . Tramadol Nausea And Vomiting  . Depakote [Divalproex Sodium] Other (See Comments)    Pt gets "delirious"  . Vicodin [Hydrocodone-Acetaminophen] Rash   Prior to Admission medications   Medication Sig Start Date End Date Taking? Authorizing Provider  aspirin EC 81 MG tablet Take 81 mg by mouth daily.   Yes Historical Provider, MD  calcium acetate (PHOSLO) 667 MG capsule Take 2,001-2,668 mg by mouth 3 (three) times daily with meals. Dosage depends on meal consumed 03/12/11  Yes Historical Provider, MD  clonazePAM (KLONOPIN) 0.5 MG tablet Take 1 mg by mouth at bedtime.    Yes Historical Provider, MD  diphenhydrAMINE (BENADRYL) 50 MG tablet Take 50 mg by mouth at bedtime as needed for allergies.    Yes Historical Provider, MD  EXFORGE 10-160 MG per tablet Take 1 tablet by mouth at bedtime.  06/15/12  Yes Historical Provider, MD  gabapentin (NEURONTIN) 100 MG capsule Take 100 mg by mouth daily.   Yes Historical Provider, MD  Insulin Aspart (NOVOLOG FLEXPEN Christine) Inject 4 Units into the skin 2 (two) times daily. Takes 4 units in am and at night, if pt eats lunch she will take insulin before she eats 01/04/13  Yes Rhae Lerner, MD  insulin glargine (LANTUS) 100 units/mL SOLN Inject 5 Units into the skin 2 (two) times daily. 01/04/13  Yes Rhae Lerner, MD  metoprolol succinate (TOPROL-XL) 100 MG 24 hr tablet Take 100 mg by mouth at bedtime. Take with or immediately following a meal.   Yes Historical Provider, MD  multivitamin (RENA-VIT) TABS tablet Take 1 tablet by mouth daily.   Yes Historical Provider, MD  mycophenolate (CELLCEPT) 250 MG capsule Take 250 mg by mouth 2 (two) times daily.   Yes Historical Provider, MD  pantoprazole (PROTONIX) 40 MG tablet Take 40 mg by mouth daily.   Yes Historical Provider, MD  predniSONE (DELTASONE) 5 MG  tablet Take 5 mg by mouth daily.  01/21/11  Yes Historical Provider, MD  tacrolimus (PROGRAF) 1 MG capsule Take 1 mg by mouth 2 (two) times daily.   Yes Historical Provider, MD  zolpidem (AMBIEN CR) 12.5 MG CR tablet Take 12.5 mg by mouth at bedtime as needed for sleep.   Yes Historical Provider, MD  albuterol (PROVENTIL HFA;VENTOLIN HFA) 108 (90 BASE) MCG/ACT inhaler Inhale 2 puffs into the lungs every 4 (four) hours as needed for wheezing or shortness of breath (as per home regimen). 02/20/13   Esperanza Sheets,  MD  levofloxacin (LEVAQUIN) 500 MG tablet Take 1 tablet (500 mg total) by mouth daily. 02/20/13   Esperanza Sheets, MD  oxyCODONE-acetaminophen (PERCOCET) 5-325 MG per tablet 1-2 tabs po q6 hours prn pain 03/02/13   Tami Ribas, MD   Current Facility-Administered Medications  Medication Dose Route Frequency Provider Last Rate Last Dose  . dextrose 5 %-0.45 % sodium chloride infusion   Intravenous Continuous Ankit Nanavati, MD      . insulin regular (NOVOLIN R,HUMULIN R) 1 Units/mL in sodium chloride 0.9 % 100 mL infusion   Intravenous Continuous Ankit Nanavati, MD      . iohexol (OMNIPAQUE) 300 MG/ML solution 25 mL  25 mL Oral Q1 Hr x 2 Medication Radiologist, MD   25 mL at 03/16/13 1419  . ondansetron (ZOFRAN) injection 4 mg  4 mg Intravenous Once Derwood Kaplan, MD       Current Outpatient Prescriptions  Medication Sig Dispense Refill  . aspirin EC 81 MG tablet Take 81 mg by mouth daily.      . calcium acetate (PHOSLO) 667 MG capsule Take 2,001-2,668 mg by mouth 3 (three) times daily with meals. Dosage depends on meal consumed      . clonazePAM (KLONOPIN) 0.5 MG tablet Take 1 mg by mouth at bedtime.       . diphenhydrAMINE (BENADRYL) 50 MG tablet Take 50 mg by mouth at bedtime as needed for allergies.       Marland Kitchen EXFORGE 10-160 MG per tablet Take 1 tablet by mouth at bedtime.       . gabapentin (NEURONTIN) 100 MG capsule Take 100 mg by mouth daily.      . Insulin Aspart (NOVOLOG FLEXPEN  [place]) Inject 4 Units into the skin 2 (two) times daily. Takes 4 units in am and at night, if pt eats lunch she will take insulin before she eats      . insulin glargine (LANTUS) 100 units/mL SOLN Inject 5 Units into the skin 2 (two) times daily.      . metoprolol succinate (TOPROL-XL) 100 MG 24 hr tablet Take 100 mg by mouth at bedtime. Take with or immediately following a meal.      . multivitamin (RENA-VIT) TABS tablet Take 1 tablet by mouth daily.      . mycophenolate (CELLCEPT) 250 MG capsule Take 250 mg by mouth 2 (two) times daily.      . pantoprazole (PROTONIX) 40 MG tablet Take 40 mg by mouth daily.      . predniSONE (DELTASONE) 5 MG tablet Take 5 mg by mouth daily.       . tacrolimus (PROGRAF) 1 MG capsule Take 1 mg by mouth 2 (two) times daily.      Marland Kitchen zolpidem (AMBIEN CR) 12.5 MG CR tablet Take 12.5 mg by mouth at bedtime as needed for sleep.      Marland Kitchen albuterol (PROVENTIL HFA;VENTOLIN HFA) 108 (90 BASE) MCG/ACT inhaler Inhale 2 puffs into the lungs every 4 (four) hours as needed for wheezing or shortness of breath (as per home regimen).  1 Inhaler  0  . levofloxacin (LEVAQUIN) 500 MG tablet Take 1 tablet (500 mg total) by mouth daily.  4 tablet  0  . oxyCODONE-acetaminophen (PERCOCET) 5-325 MG per tablet 1-2 tabs po q6 hours prn pain  30 tablet  0  . [DISCONTINUED] NORVASC 10 MG tablet Take 1 tablet by mouth Daily.       Labs: Basic Metabolic Panel:  Recent Labs Lab 03/16/13 1145  NA 120*  K 5.7*  CL 81*  CO2 18*  GLUCOSE 1076*  BUN 52*  CREATININE 8.29*  CALCIUM 7.7*   Liver Function Tests:  Recent Labs Lab 03/16/13 1145  AST 44*  ALT 27  ALKPHOS 156*  BILITOT 0.8  PROT 6.7  ALBUMIN 3.3*    Recent Labs Lab 03/16/13 1145  LIPASE 46   CBC:  Recent Labs Lab 03/16/13 1145  WBC 5.5  NEUTROABS 4.6  HGB 10.2*  HCT 34.3*  MCV 112.5*  PLT 164   Cardiac Enzymes:  Recent Labs Lab 03/16/13 1145  TROPONINI <0.30   ROS: As per HPI otherwise  negative  Physical Exam: Filed Vitals:   03/16/13 1003 03/16/13 1142 03/16/13 1319  BP: 157/109 152/72 143/44  Pulse: 77 73 69  Temp: 98.2 F (36.8 C)    TempSrc: Oral    Resp: 20 18 18   SpO2: 85% 98% 99%     General: Well developed, well nourished, in no acute distress. Head: Normocephalic, atraumatic, sclera non-icteric, mucus membranes are moist Neck: Supple. JVD not elevated. Lungs: coarse BS moreso on the right. Breathing is unlabored. Heart: RRR with S1 S2. No murmurs, rubs, or gallops appreciated. Abdomen: Midline abdominal scar; tender left upper quadrant, soft, , non-distended with normoactive bowel sounds. No rebound/guarding.  M-S:  Strength and tone appear normal for age. Lower extremities: without edema or ischemic changes, no open wounds  Neuro: Alert and oriented X 3. Moves all extremities spontaneously. Psych:  Responds to questions appropriately with a normal affect. Skin:  Multiple tattoos  arms and back Dialysis Access: right high graft patent  Dialysis Orders: [facility]: [facility] Optiflx 160 3.75 hr 400/A 1.5 EDW 68.5 (post HD wts ranged 67.5 - 69) 2K 2.5 Ca Hectorol 1 heparin 5000 Epo 10,000 right thigh graft Recent labs:  Hdg 10.1 10/16; Sept labs iPTH 213 Ca 9 P 1.6 (2.5 - August)) Hgb A!C 11.4 - July 2014  Assessment/Plan: 1. SOB - ? Bronchitis - breathing better per pt; for CT ango; work up per EDP 2. Abdominal pain - sx are reminiscent of previous admission; work up in progress 3. Hypergylcemia with poorly controlled DM/ hx failed pancreas tx - last outpt AIC 11.4 in July; BS 1000 and claims to have taken Lantus last night 4. ESRD TTS with hyperkalemia/hyponatremia due to hyperglycemia - hx failed kidney/pancreas tx  still on low dose immunosuppressive therapy.  According to records she is still on cellcept bid and prograf bid, but admission nephrology consult in Sept showed these were too have been tapered to qd but infact stayed at bid last admission.  Hyperkalemia should improve with lowering of blood sugar; plan HD in the am; no urgent need now 5. Hypertension/volume  - has D5NS ordered for 100/hr - advised RN to hold for now due to hyperglycemia; drinking contrast - doesn't need extra volume. 6. Anemia  - Hgb 10.2 consistent with outpt labs - continue ESA 7. Metabolic bone disease -  Last outpt P was low; will recheck; continue hectorol 1; last P was low; recheck - may need to adjust meds. 8. Nutrition - needs renal diet, not carb mod due to ^ K content  Sheffield Slider, PA-C [facility] Beeper [phone] 03/16/2013, 2:44 PM    Patient seen and examined.  Agree with assessment and plan as above. Vinson Moselle  MD Pager [phone]    Cell  [phone] 03/16/2013, 7:57 PM

## 2013-03-16 NOTE — H&P (Addendum)
[facility] History and Physical  [name] HQI:[id number] DOB: 03-Sep-1970 DOA: 03/16/2013  Referring physician: er PCP: [name] Rasmussen, MD  Specialists: renal  Chief Complaint: abd pain  HPI: [name] is a 42 y.o. female  Who presents from home.  She got dialysis Saturday and was feeling fine.  SHe then woke up Sunday with Sharp abd pain.  This is different than her normal pain.  She also says she has SOB with cough x 1 week.  She also says she vomited this AM.  She contributed her abd pain to her menstrual cycle ending and took "something" for it but it did not improve.  Had a BM today-no diarrhea  In the Er, a V/Q scan was done and was low prob.  A CT scan is pending.  Labs showed a glucose of > 1000.  Patient insists that she is taking her insulin.  SHe was seen by nephro and scheduled for dialysis tomm AM.     Review of Systems: all systems reviewed, negative unless stated above   Past Medical History  Diagnosis Date  . Hyperlipidemia   . Alopecia   . Obesity   . Iron deficiency   . 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 RLS (restless legs syndrome)   . Respiratory failure   . Injury of conjunctiva and corneal abrasion of right eye without foreign body   . Cellulitis   . Dialysis patient     Tues; Thurs; Sat; [place]  . 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 Anemia   . Pneumonia 2012    "double"  . Blood transfusion 2004    "when I had my transplant"  . Migraine   . 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 Hyperparathyroidism, secondary   . Diabetic retinopathy   . Hypertension     takes Metoprolol and Exforge daily  . Depression     takes Klonopin nightly  . Diabetes mellitus type 1     Pt states diagnosed at age 36 with prior episodes of DKA. S/p pancreatic transplant   . GERD (gastroesophageal reflux  disease)     takes Protonix daily  . PONV (postoperative nausea and vomiting)   . Bronchitis   . Migraine   . Asthma   . Shortness of breath   . Emphysema of lung   . DKA (diabetic ketoacidoses) 07/02/2012  . Angina     none in past 2 years.  . Renal insufficiency    Past Surgical History  Procedure Laterality Date  . 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 Thyroglobulin      x 7  . Av fistula placement      right upper arm  . Cholecystectomy  1995  .  hd graft placement/removal      "had 2 in my left upper arm"  . Eye surgery    . Retinopathy surgery    . Tooth extraction  10/10/11    X's two  . Insertion of dialysis catheter  12/08/2011    Procedure: INSERTION OF DIALYSIS CATHETER;  Surgeon: Chuck Hint, MD;  Location: [facility] OR;  Service: Vascular;  Laterality: Left;  . Av fistula placement  01/22/2012    Procedure: INSERTION OF ARTERIOVENOUS (AV) GORE-TEX GRAFT ARM;  Surgeon: Chuck Hint, MD;  Location: [facility] OR;  Service: Vascular;  Laterality: Left;  Ultrasound guided.  . Av fistula placement  03/14/2012    Procedure: INSERTION OF ARTERIOVENOUS (AV) GORE-TEX GRAFT THIGH;  Surgeon: Chuck Hint, MD;  Location: [facility] OR;  Service: Vascular;  Laterality: Right;  . False aneurysm repair Right 01/02/2013    Procedure: Excision of lymphocele in right thigh.;  Surgeon: Chuck Hint, MD;  Location: [facility] OR;  Service: Vascular;  Laterality: Right;  . Carpal tunnel release Right 03/02/2013    Procedure: CARPAL TUNNEL RELEASE;  Surgeon: Tami Ribas, MD;  Location: [facility];  Service: Orthopedics;  Laterality: Right;   Social History:  reports that she has never smoked. She has never used smokeless tobacco. She reports that she does not drink alcohol or use illicit drugs.   Allergies  Allergen Reactions  . Depakote [Divalproex Sodium] Other (See Comments)    Pt gets "delirious"  . Morphine And Related  Nausea And Vomiting and Other (See Comments)    "makes me delirious"  . Penicillins Anaphylaxis  . Tramadol Nausea And Vomiting  . Vicodin [Hydrocodone-Acetaminophen] Itching and Rash    Family History  Problem Relation Age of Onset  . Hypertension Mother   . Kidney disease Mother   . Diabetes Mother   . Hypertension Father   . Kidney disease Father   . Diabetes Father     Prior to Admission medications   Medication Sig Start Date End Date Taking? Authorizing Provider  albuterol (PROVENTIL HFA;VENTOLIN HFA) 108 (90 BASE) MCG/ACT inhaler Inhale 2 puffs into the lungs every 4 (four) hours as needed for wheezing or shortness of breath (as per home regimen). 02/20/13  Yes Esperanza Sheets, MD  aspirin EC 81 MG tablet Take 81 mg by mouth daily.   Yes Historical Provider, MD  calcium acetate (PHOSLO) 667 MG capsule Take 2,001-2,668 mg by mouth 3 (three) times daily with meals. Dosage depends on meal consumed 03/12/11  Yes Historical Provider, MD  clonazePAM (KLONOPIN) 0.5 MG tablet Take 1 mg by mouth at bedtime.    Yes Historical Provider, MD  diphenhydrAMINE (BENADRYL) 50 MG tablet Take 50 mg by mouth at bedtime as needed for allergies.    Yes Historical Provider, MD  EXFORGE 10-160 MG per tablet Take 1 tablet by mouth at bedtime.  06/15/12  Yes Historical Provider, MD  gabapentin (NEURONTIN) 100 MG capsule Take 100 mg by mouth daily.   Yes Historical Provider, MD  Insulin Aspart (NOVOLOG FLEXPEN [place]) Inject 4 Units into the skin 2 (two) times daily. Takes 4 units in am and at night, if pt eats lunch she will take insulin before she eats 01/04/13  Yes Rhae Lerner, MD  insulin glargine (LANTUS) 100 units/mL SOLN Inject 5 Units into the skin 2 (two) times daily. 01/04/13  Yes Rhae Lerner, MD  metoprolol succinate (TOPROL-XL) 100 MG 24 hr tablet Take 100 mg by mouth at bedtime. Take with or immediately following a meal.   Yes Historical Provider, MD  multivitamin  (RENA-VIT) TABS tablet Take 1 tablet by mouth daily.   Yes Historical Provider, MD  mycophenolate (CELLCEPT) 250 MG capsule Take 250 mg by mouth 2 (two) times daily.   Yes Historical Provider, MD  pantoprazole (PROTONIX) 40 MG tablet Take 40 mg by mouth daily.   Yes Historical Provider, MD  predniSONE (DELTASONE) 5 MG tablet Take 5 mg by mouth daily.  01/21/11  Yes Historical Provider, MD  tacrolimus (PROGRAF) 1  MG capsule Take 1 mg by mouth 2 (two) times daily.   Yes Historical Provider, MD  zolpidem (AMBIEN CR) 12.5 MG CR tablet Take 12.5 mg by mouth at bedtime as needed for sleep.   Yes Historical Provider, MD   Physical Exam: Filed Vitals:   03/16/13 1319  BP: 143/44  Pulse: 69  Temp:   Resp: 18     General:  A+Ox3, NAD  Eyes: wnl  ENT: wnl  Neck: EJ in place  Cards: rrr  Respiratory: decreased b/l  Abdomen: decreased bowel sounds, mild tenderness  Skin: no rashes or lesions, has tattoos  Musculoskeletal: moves all 4 ext  Psychiatric: normal mood/affect  Neurologic: CN 2-12 intact  Labs on Admission:  Basic Metabolic Panel:  Recent Labs Lab 03/16/13 1145  NA 120*  K 5.7*  CL 81*  CO2 18*  GLUCOSE 1076*  BUN 52*  CREATININE 8.29*  CALCIUM 7.7*   Liver Function Tests:  Recent Labs Lab 03/16/13 1145  AST 44*  ALT 27  ALKPHOS 156*  BILITOT 0.8  PROT 6.7  ALBUMIN 3.3*    Recent Labs Lab 03/16/13 1145  LIPASE 46   No results found for this basename: AMMONIA,  in the last 168 hours CBC:  Recent Labs Lab 03/16/13 1145  WBC 5.5  NEUTROABS 4.6  HGB 10.2*  HCT 34.3*  MCV 112.5*  PLT 164   Cardiac Enzymes:  Recent Labs Lab 03/16/13 1145  TROPONINI <0.30    BNP (last 3 results) No results found for this basename: PROBNP,  in the last 8760 hours CBG: No results found for this basename: GLUCAP,  in the last 168 hours  Radiological Exams on Admission: Dg Chest 2 View  03/16/2013   CLINICAL DATA:  Shortness of breath  EXAM: CHEST   2 VIEW  COMPARISON:  02/19/2013  FINDINGS: The cardio pericardial silhouette is enlarged. There is right base atelectasis or infiltrate. Small right pleural effusion associated. Imaged bony structures of the thorax are intact.  IMPRESSION: Right base atelectasis or pneumonia with small right pleural effusion.   Electronically Signed   By: Kennith Center M.D.   On: 03/16/2013 17:34   Ct Abdomen Pelvis W Contrast  03/16/2013   CLINICAL DATA:  Diffuse stabbing abdominal pain with nausea and vomiting for 2 days, chronic dialysis  EXAM: CT ABDOMEN AND PELVIS WITH CONTRAST  TECHNIQUE: Multidetector CT imaging of the abdomen and pelvis was performed using the standard protocol following bolus administration of intravenous contrast.  CONTRAST:  OMNIPAQUE IOHEXOL 300 MG/ML  SOLN  COMPARISON:  06/10/2012  FINDINGS: There is mild scattered bilateral hazy ground-glass attenuation with more focal dependent atelectasis bilaterally. There is a tiny left and small to moderate right pleural effusion.  There are no focal hepatic abnormalities. There is mild periportal edema. The spleen, pancreas, and adrenal glands are normal. There is severe bilateral renal atrophy and cystic change. 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 There is a nonobstructive bowel gas pattern with mild diffuse colon wall thickening. There is also mild infiltration of the omentum and inflammatory change in the adjacent fat. These findings are similar to the prior study. The appendix is normal. Reproductive organs and bladder are normal. There is a small volume of ascites. Small transplanted kidney left flank unchanged. There are no acute musculoskeletal findings.  IMPRESSION: Diffuse colon wall thickening with mild infiltration of the omentum. The findings again suggest a diffuse colitis.   Electronically Signed   By:  Esperanza Heir M.D.   On: 03/16/2013 17:45   Nm Pulmonary Perf And Vent  03/16/2013    CLINICAL DATA:  Shortness of breath  EXAM: NUCLEAR MEDICINE VENTILATION - PERFUSION LUNG SCAN  TECHNIQUE: Ventilation images were obtained in multiple projections using inhaled aerosol technetium 99 M DTPA. Perfusion images were obtained in multiple projections after intravenous injection of Tc-110m MAA.  COMPARISON:  02/27/2011.  RADIOPHARMACEUTICALS:  40 mCi Tc-7m DTPA aerosol and 6 mCi Tc-53m MAA  FINDINGS: Ventilation: No focal ventilation defect.  Perfusion: No wedge shaped peripheral perfusion defects to suggest acute pulmonary embolism.  IMPRESSION: Very low probability for pulmonary embolus. .   Electronically Signed   By: Kennith Center M.D.   On: 03/16/2013 17:21      Assessment/Plan Active Problems:   ESRD (end stage renal disease)   Abdominal pain   Obesity   Anemia of chronic renal failure   Hyperosmolar hyperglycemia   HHS: placed on insulin gtt, avoid IVF as dialysis patient, place in SDU as on insulin gtt, monitor BS closely  SOB - V/Q scan negative for PE  Abdominal pain - patient says pain is different than previous hospitalizations; CT scan pending, minimize narcotic-- CT scan back with a diffuse colitis- started on cipro/flagyl -may need GI consult as outpatient/inpatient   ESRD TTS with hyperkalemia/hyponatremia due to hyperglycemia - hx failed kidney/pancreas tx still on low dose immunosuppressive therapy. Per npehro she is still on cellcept bid and prograf bid, but nephrology consult in Sept showed these were too have been tapered to qd but infact stayed at bid last admission.  -Hyperkalemia should improve with lowering of blood sugar; monitor BMP - HD in the am  Hypertension/volume - no fluids as dialysis patient.   Anemia - Hgb 10.2 consistent with outpt labs - per nephro  Metabolic bone disease - per nephro  Nutrition - needs renal diet, not carb mod due to increased K content   nephro consult, seen in ER  Code Status: full Family Communication:  patient Disposition Plan: admit  Time spent: 75 min  Jerett Odonohue [facility] Pager [phone]  If 7PM-7AM, please contact night-coverage [email or web address] Password [email or web address] 03/16/2013, 6:03 PM

## 2013-03-16 NOTE — ED Notes (Signed)
MD at bedside. 

## 2013-03-16 NOTE — ED Notes (Signed)
Called flow to check on bed placement-- no stepdown beds available.

## 2013-03-17 ENCOUNTER — Encounter (HOSPITAL_COMMUNITY): Payer: Self-pay | Admitting: Family Medicine

## 2013-03-17 ENCOUNTER — Encounter: Payer: Self-pay | Admitting: Vascular Surgery

## 2013-03-17 DIAGNOSIS — N186 End stage renal disease: Secondary | ICD-10-CM

## 2013-03-17 LAB — CBC
HCT: 30.4 % — ABNORMAL LOW (ref 36.0–46.0)
Hemoglobin: 9.9 g/dL — ABNORMAL LOW (ref 12.0–15.0)
MCH: 33.4 pg (ref 26.0–34.0)
MCH: 33.6 pg (ref 26.0–34.0)
MCHC: 32.6 g/dL (ref 30.0–36.0)
MCV: 101.7 fL — ABNORMAL HIGH (ref 78.0–100.0)
MCV: 103.1 fL — ABNORMAL HIGH (ref 78.0–100.0)
Platelets: 169 10*3/uL (ref 150–400)
Platelets: 139 10*3/uL — ABNORMAL LOW (ref 150–400)
RBC: 2.99 MIL/uL — ABNORMAL LOW (ref 3.87–5.11)
WBC: 6.9 10*3/uL (ref 4.0–10.5)

## 2013-03-17 LAB — GLUCOSE, CAPILLARY
Glucose-Capillary: >600 mg/dL (ref 70–99)
Glucose-Capillary: >600 mg/dL (ref 70–99)
Glucose-Capillary: >600 mg/dL (ref 70–99)
Glucose-Capillary: 37 mg/dL — CL (ref 70–99)
Glucose-Capillary: 106 mg/dL — ABNORMAL HIGH (ref 70–99)
Glucose-Capillary: 180 mg/dL — ABNORMAL HIGH (ref 70–99)
Glucose-Capillary: 221 mg/dL — ABNORMAL HIGH (ref 70–99)
Glucose-Capillary: >600 mg/dL (ref 70–99)
Glucose-Capillary: 390 mg/dL — ABNORMAL HIGH (ref 70–99)

## 2013-03-17 LAB — BASIC METABOLIC PANEL
BUN: 56 mg/dL — ABNORMAL HIGH (ref 6–23)
CO2: 21 mEq/L (ref 19–32)
Calcium: 7.9 mg/dL — ABNORMAL LOW (ref 8.4–10.5)
Chloride: 89 mEq/L — ABNORMAL LOW (ref 96–112)
Potassium: 4.1 mEq/L (ref 3.5–5.1)
Sodium: 128 mEq/L — ABNORMAL LOW (ref 135–145)

## 2013-03-17 IMAGING — CR DG CHEST 2V
2 series · 2 of 2 positions shown · non-contrast
Comparison: 02/18/2011

CLINICAL DATA: Cough and congestion

CHEST - 2 VIEW

[w chest pa]
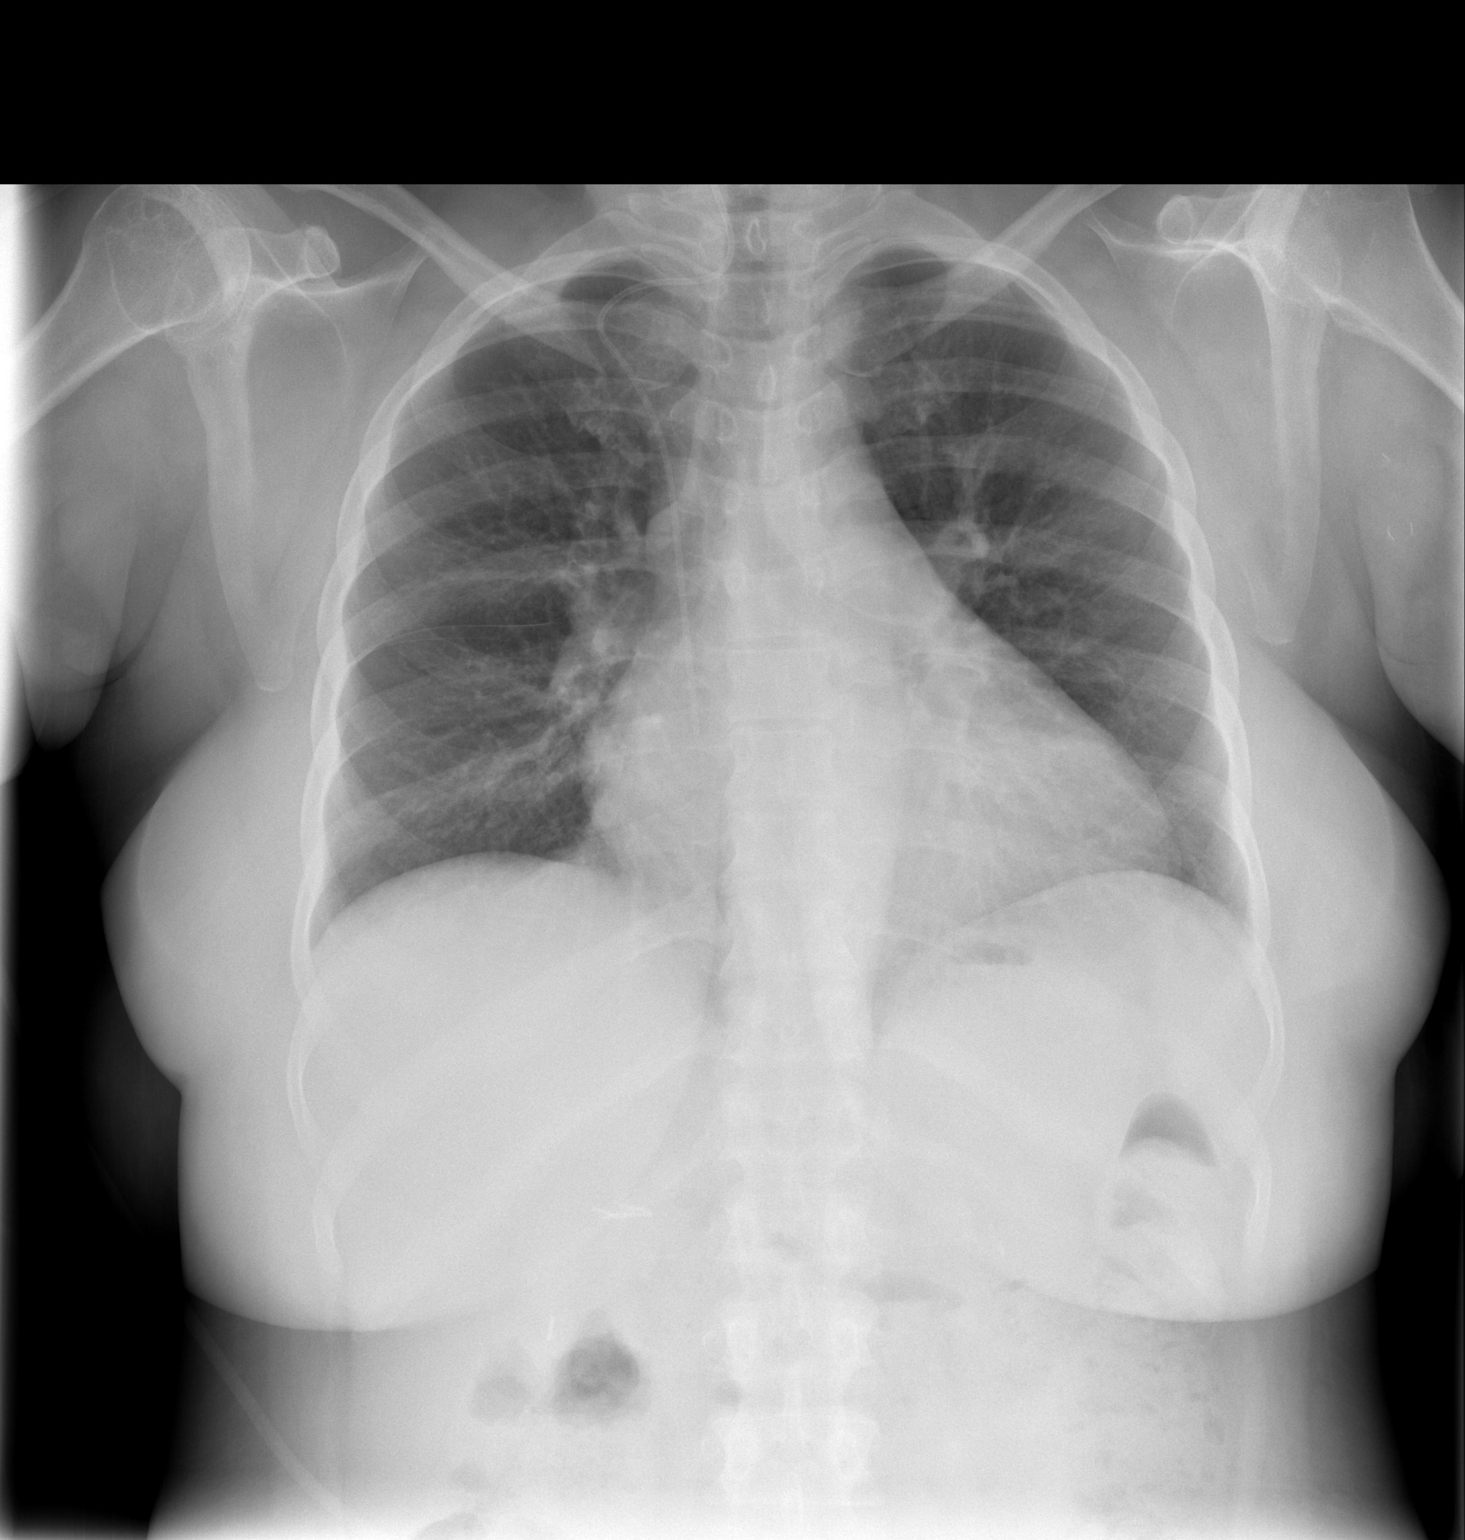

[w chest lat]
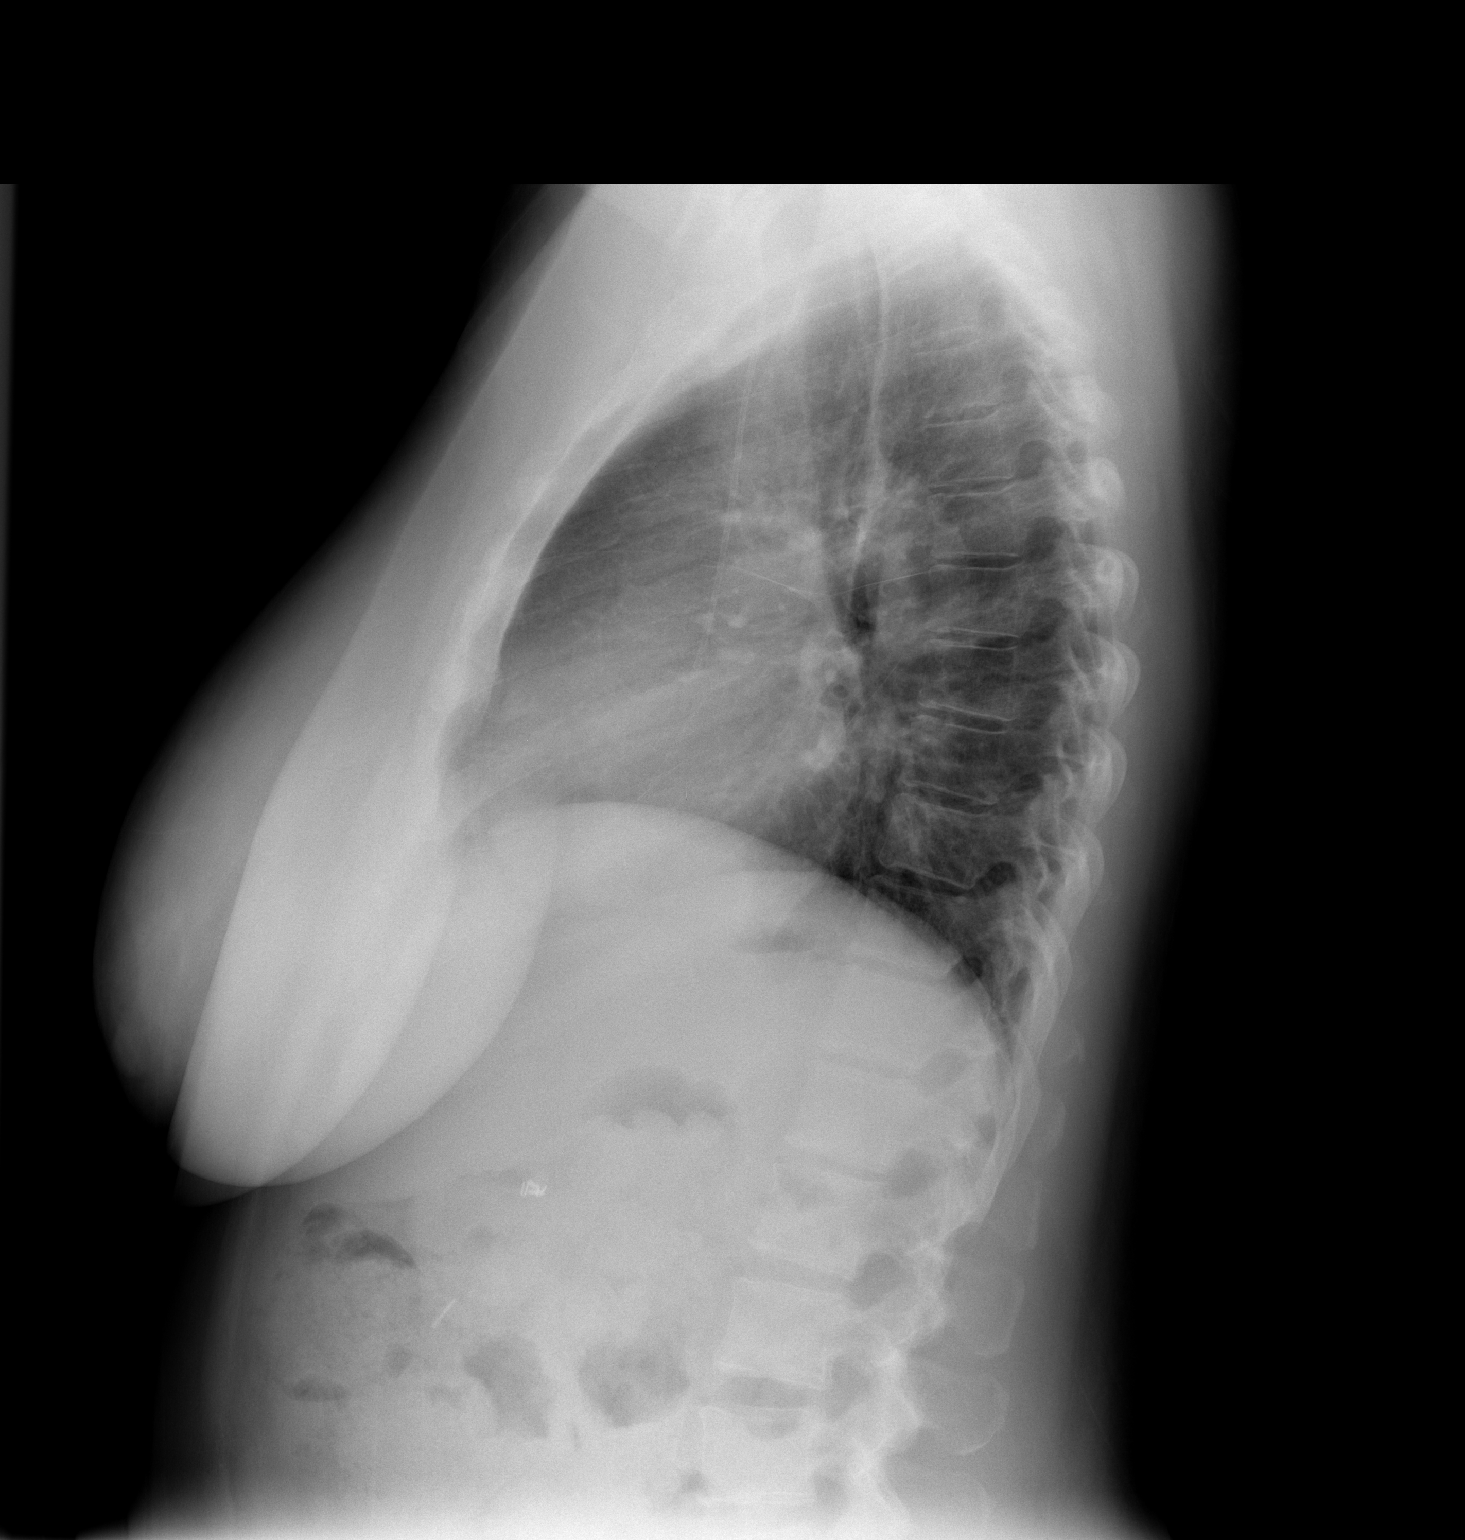

[2 of 2 positions shown; findings below may reference images not displayed]

FINDINGS: Moderate cardiomegaly.  Pulmonary vascularity is within
normal limits.  Lungs are under aerated and clear.  Mild bronchitic
changes.
IMPRESSION: Cardiomegaly without edema.  Bronchitic changes.

## 2013-03-17 MED ORDER — INSULIN GLARGINE 100 UNIT/ML ~~LOC~~ SOLN
15.0000 [IU] | SUBCUTANEOUS | Status: AC
  Administered 2013-03-17: 15 [IU] via SUBCUTANEOUS
  Filled 2013-03-17: qty 0.15

## 2013-03-17 MED ORDER — HYDROMORPHONE HCL PF 1 MG/ML IJ SOLN
INTRAMUSCULAR | Status: AC
  Administered 2013-03-17: 0.5 mg via INTRAVENOUS
  Filled 2013-03-17: qty 1

## 2013-03-17 MED ORDER — HYDROXYZINE HCL 25 MG PO TABS
ORAL_TABLET | ORAL | Status: AC
  Administered 2013-03-17: 25 mg via ORAL
  Filled 2013-03-17: qty 1

## 2013-03-17 MED ORDER — INSULIN GLARGINE 100 UNIT/ML ~~LOC~~ SOLN: 10.0000 [IU] | Freq: Every day | SUBCUTANEOUS | Status: DC

## 2013-03-17 MED ORDER — HYDROMORPHONE HCL PF 1 MG/ML IJ SOLN
0.5000 mg | INTRAMUSCULAR | Status: DC | PRN
  Administered 2013-03-17 – 2013-03-20 (×13): 0.5 mg via INTRAVENOUS
  Filled 2013-03-17 (×11): qty 1

## 2013-03-17 MED ORDER — INSULIN GLARGINE 100 UNIT/ML ~~LOC~~ SOLN
25.0000 [IU] | Freq: Every day | SUBCUTANEOUS | Status: DC
  Administered 2013-03-18 – 2013-03-19 (×2): 25 [IU] via SUBCUTANEOUS
  Filled 2013-03-17 (×3): qty 0.25

## 2013-03-17 MED ORDER — INSULIN ASPART 100 UNIT/ML ~~LOC~~ SOLN
5.0000 [IU] | Freq: Once | SUBCUTANEOUS | Status: AC
  Administered 2013-03-17: 5 [IU] via SUBCUTANEOUS

## 2013-03-17 MED ORDER — DEXTROSE 50 % IV SOLN: 25.0000 mL | Freq: Once | INTRAVENOUS | Status: DC | PRN

## 2013-03-17 MED ORDER — INSULIN ASPART 100 UNIT/ML ~~LOC~~ SOLN
3.0000 [IU] | Freq: Three times a day (TID) | SUBCUTANEOUS | Status: DC
  Administered 2013-03-17 – 2013-03-19 (×5): 3 [IU] via SUBCUTANEOUS

## 2013-03-17 MED ORDER — INSULIN ASPART 100 UNIT/ML ~~LOC~~ SOLN
20.0000 [IU] | Freq: Once | SUBCUTANEOUS | Status: AC
  Administered 2013-03-17: 20 [IU] via SUBCUTANEOUS

## 2013-03-17 MED ORDER — INSULIN GLARGINE 100 UNIT/ML ~~LOC~~ SOLN
10.0000 [IU] | Freq: Every day | SUBCUTANEOUS | Status: DC
  Administered 2013-03-17: 10 [IU] via SUBCUTANEOUS
  Filled 2013-03-17: qty 0.1

## 2013-03-17 MED ORDER — INSULIN ASPART 100 UNIT/ML ~~LOC~~ SOLN
0.0000 [IU] | SUBCUTANEOUS | Status: DC
  Administered 2013-03-17 (×2): 2 [IU] via SUBCUTANEOUS

## 2013-03-17 MED ORDER — INSULIN ASPART 100 UNIT/ML ~~LOC~~ SOLN
0.0000 [IU] | Freq: Three times a day (TID) | SUBCUTANEOUS | Status: DC
Start: 2013-03-17 — End: 2013-03-20
  Administered 2013-03-17: 9 [IU] via SUBCUTANEOUS
  Administered 2013-03-19: 2 [IU] via SUBCUTANEOUS

## 2013-03-17 MED ORDER — HYDROXYZINE HCL 25 MG PO TABS
25.0000 mg | ORAL_TABLET | Freq: Three times a day (TID) | ORAL | Status: DC | PRN
  Administered 2013-03-17: 25 mg via ORAL
  Filled 2013-03-17: qty 1

## 2013-03-17 MED ORDER — INSULIN ASPART 100 UNIT/ML ~~LOC~~ SOLN
0.0000 [IU] | Freq: Every day | SUBCUTANEOUS | Status: DC
  Administered 2013-03-18 – 2013-03-19 (×2): 3 [IU] via SUBCUTANEOUS

## 2013-03-17 MED ORDER — DEXTROSE 50 % IV SOLN
25.0000 mL | INTRAVENOUS | Status: DC | PRN
  Administered 2013-03-17: 25 mL via INTRAVENOUS

## 2013-03-17 MED ORDER — GLUCOSE 40 % PO GEL: 1.0000 | ORAL | Status: DC | PRN

## 2013-03-17 MED ORDER — DARBEPOETIN ALFA-POLYSORBATE 60 MCG/0.3ML IJ SOLN
INTRAMUSCULAR | Status: AC
  Administered 2013-03-17: 60 ug via INTRAVENOUS
  Filled 2013-03-17: qty 0.3

## 2013-03-17 MED ORDER — DEXTROSE 50 % IV SOLN: 50.0000 mL | INTRAVENOUS | Status: DC | PRN

## 2013-03-17 MED ORDER — DOXERCALCIFEROL 4 MCG/2ML IV SOLN
INTRAVENOUS | Status: AC
  Administered 2013-03-17: 1 ug via INTRAVENOUS
  Filled 2013-03-17: qty 2

## 2013-03-17 MED ORDER — DEXTROSE 50 % IV SOLN
INTRAVENOUS | Status: AC
  Filled 2013-03-17: qty 50

## 2013-03-17 NOTE — Progress Notes (Signed)
Critical CBG >600 x2, paged Dr. Benjamine Mola.  12u Novolog admin per order.  1 hr recheck CBG > 600 x2, paged Dr. Conley Rolls

## 2013-03-17 NOTE — Progress Notes (Signed)
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    Results for [name], [name] (MRN [id number]) as of 03/17/2013 13:54  Ref. Range 03/16/2013 23:35 03/17/2013 00:48 03/17/2013 01:15 03/17/2013 02:28 03/17/2013 03:07 03/17/2013 04:44 03/17/2013 08:06 03/17/2013 13:34  Glucose-Capillary Latest Range: 70-99 mg/dL 80 43 (LL) 77 37 (LL) 324 (H) 187 (H) 180 (H) 221 (H)     Inpatient Diabetes Program Recommendations Insulin - Basal: Lantus 10 units QAM - did not receive this am. Insulin - Meal Coverage: Will likely benefit from addition of meal coverage insulin - Novolog 3 units tidwc if pt eats >50% meal HgbA1C: 10.0% - Probably not accurate with low RBCs Diet: Renal  Note: Will follow while inpatient.  Thank you. Ailene Ards, RD, LDN, CDE Inpatient Diabetes Coordinator [phone]

## 2013-03-17 NOTE — Progress Notes (Addendum)
Hypoglycemic Event   CBG: Hypoglycemic event x2...43, 37  Treatment: 25ml D50 x2, Also, gave pt a snack  Symptoms: drowsy  Follow-up CBG: Time: CBG Result:  77, 106   Possible Reasons for Event: Pt was on insulin gtt, After first event, gtt dc'd and pt transitioned to insulin sliding scale/diet.    Comments/MD notified: Craige Cotta, NP aware. Drip dc'd and pt transitioned.      [name]  Remember to initiate Hypoglycemia Order Set & complete

## 2013-03-17 NOTE — Progress Notes (Signed)
INITIAL NUTRITION ASSESSMENT  DOCUMENTATION CODES Per approved criteria  -Obesity Unspecified   INTERVENTION: - Snacks TID per patient preference  - Encouraged adequate intake of meals.   NUTRITION DIAGNOSIS: Inadequate oral intake  related to decreased appetite as evidenced by patient report.   Goal: Patient will meet >/=90% of estimated nutrition needs  Monitor:  PO intake, weight, labs, I/Os  Reason for Assessment: Malnutrition screening tool  42 y.o. female  Admitting Dx: Abdominal pain  ASSESSMENT: Patient admitted with abdominal pain secondary to colitis.  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  She reports that she has not been eating well for the last few months, related to decreased appetite. She only ate her grits this morning for breakfast. She states that she thinks she has lost weight due to her clothes fitting differently, but unable to specify amount of weight loss. She's requesting snacks between meals. Current weight within UBW range. Patient tries to follow a diabetic diet. HgbA1c 10, but patient declines education.   Nutrition Focused Physical Exam:  Subcutaneous Fat:  Orbital Region: WNL Upper Arm Region: WNL Thoracic and Lumbar Region: WNL  Muscle:  Temple Region: WNL Clavicle Bone Region: WNL Clavicle and Acromion Bone Region: WNL Scapular Bone Region: WNL Dorsal Hand: WNL Patellar Region: WNL Anterior Thigh Region: WNL Posterior Calf Region: WNL  Edema: WNL    Height: Ht Readings from Last 1 Encounters:  03/16/13 5\' 1"  (1.549 m)    Weight: Wt Readings from Last 1 Encounters:  03/17/13 160 lb 15 oz (73 kg)    Ideal Body Weight: 105 pounds  % Ideal Body Weight: 153%  Wt Readings from Last 10 Encounters:  03/17/13 160 lb 15 oz (73 kg)  03/02/13 151 lb 3.2 oz (68.584 kg)  03/02/13 151 lb 3.2 oz (68.584 kg)  02/19/13 149 lb 5 oz (67.728 kg)  01/17/13 154 lb 5.2 oz (70 kg)  01/04/13  145 lb (65.772 kg)  01/01/13 153 lb 6.4 oz (69.582 kg)  12/31/12 155 lb (70.308 kg)  12/24/12 160 lb (72.576 kg)  12/17/12 158 lb (71.668 kg)    Usual Body Weight: 155-165 pounds  % Usual Body Weight: ~100%  BMI:  Body mass index is 30.42 kg/(m^2). Patient meets criteria for Obesity Class 1  Estimated Nutritional Needs: Kcal: 1750-1900 kcal Protein: 85-100 g Fluid: Per MD  Skin: Intact  Diet Order: Renal  EDUCATION NEEDS: -No education needs identified at this time   Intake/Output Summary (Last 24 hours) at 03/17/13 1301 Last data filed at 03/17/13 0445  Gross per 24 hour  Intake  208.9 ml  Output      0 ml  Net  208.9 ml    Last BM: 10/21   Labs:   Recent Labs Lab 03/16/13 1843 03/16/13 2220 03/17/13 0400  NA 122* 129* 128*  K 6.2* 4.4 4.1  CL 85* 90* 89*  CO2 14* 17* 21  BUN 59* 57* 56*  CREATININE 8.52* 8.75* 9.00*  CALCIUM 7.6* 8.0* 7.9*  GLUCOSE 894* 209* 113*    CBG (last 3)   Recent Labs  03/17/13 0307 03/17/13 0444 03/17/13 0806  GLUCAP 106* 187* 180*    Scheduled Meds: . albuterol  2.5 mg Nebulization Q6H  . ciprofloxacin  400 mg Intravenous Q24H  . clonazePAM  1 mg Oral QHS  . darbepoetin (ARANESP) injection - DIALYSIS  60 mcg Intravenous Q Tue-HD  . doxercalciferol  1 mcg Intravenous Q T,Th,Sa-HD  .  gabapentin  100 mg Oral Daily  . heparin  5,000 Units Dialysis Once in dialysis  . heparin  5,000 Units Subcutaneous Q8H  . insulin aspart  0-9 Units Subcutaneous Q4H  . insulin glargine  10 Units Subcutaneous Daily  . metronidazole  500 mg Intravenous Q8H  . mycophenolate  250 mg Oral BID  . predniSONE  5 mg Oral Daily  . sodium chloride  3 mL Intravenous Q12H  . sodium chloride  3 mL Intravenous Q12H    Continuous Infusions: . sodium chloride 10 mL/hr at 03/16/13 2135    Past Medical History  Diagnosis Date  . Hyperlipidemia   . Alopecia   . Obesity   . Iron deficiency   . ESRD (end stage renal disease)     S/p  pancreatic and kidney transplant, but back on HD 2008  . RLS (restless legs syndrome)   . Respiratory failure   . Injury of conjunctiva and corneal abrasion of right eye without foreign body   . Cellulitis   . Dialysis patient     Tues; Thurs; Sat; [place]  . 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 Anemia   . Pneumonia 2012    "double"  . Blood transfusion 2004    "when I had my transplant"  . Migraine   . 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 Hyperparathyroidism, secondary   . Diabetic retinopathy   . Hypertension     takes Metoprolol and Exforge daily  . Depression     takes Klonopin nightly  . Diabetes mellitus type 1     Pt states diagnosed at age 74 with prior episodes of DKA. S/p pancreatic transplant   . GERD (gastroesophageal reflux disease)     takes Protonix daily  . PONV (postoperative nausea and vomiting)   . Bronchitis   . Migraine   . Asthma   . Shortness of breath   . Emphysema of lung   . DKA (diabetic ketoacidoses) 07/02/2012  . Angina     none in past 2 years.  . Renal insufficiency     Past Surgical History  Procedure Laterality Date  . 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 Thyroglobulin      x 7  . Av fistula placement      right upper arm  . Cholecystectomy  1995  .  hd graft placement/removal      "had 2 in my left upper arm"  . Eye surgery    . Retinopathy surgery    . Tooth extraction  10/10/11    X's two  . Insertion of dialysis catheter  12/08/2011    Procedure: INSERTION OF DIALYSIS CATHETER;  Surgeon: Chuck Hint, MD;  Location: [facility] OR;  Service: Vascular;  Laterality: Left;  . Av fistula placement  01/22/2012    Procedure: INSERTION OF ARTERIOVENOUS (AV) GORE-TEX GRAFT ARM;  Surgeon: Chuck Hint, MD;  Location: [facility] OR;  Service: Vascular;  Laterality: Left;  Ultrasound guided.  . Av fistula placement   03/14/2012    Procedure: INSERTION OF ARTERIOVENOUS (AV) GORE-TEX GRAFT THIGH;  Surgeon: Chuck Hint, MD;  Location: [facility] OR;  Service: Vascular;  Laterality: Right;  . False aneurysm repair Right 01/02/2013    Procedure: Excision of lymphocele in right thigh.;  Surgeon: Chuck Hint, MD;  Location: [facility] OR;  Service: Vascular;  Laterality: Right;  .  Carpal tunnel release Right 03/02/2013    Procedure: CARPAL TUNNEL RELEASE;  Surgeon: Tami Ribas, MD;  Location: [facility];  Service: Orthopedics;  Laterality: Right;    Linnell Fulling, RD, LDN Pager #: [phone] After-Hours Pager #: [phone]

## 2013-03-17 NOTE — Progress Notes (Signed)
  [facility] Progress Note   Subjective: Feels bad, nothing specific, abd pain still bothering her, no diarrhea  Physical Exam:  Blood pressure 119/60, pulse 71, temperature 98 F (36.7 C), temperature source Oral, resp. rate 26, height 5\' 1"  (1.549 m), weight 73 kg (160 lb 15 oz), last menstrual period 03/14/2013, SpO2 100.00%.  gne- alert  neck- no JVD  chest- clear bilat  cor- reg 2/6 SEM  abd- mild diffuse tenderness, +BS, no ascites  ext- no edema  access- R thigh AVG no erythema or drainage, patent  Dialysis Orders: (NW TTS) Optiflx 160 3.75 hr    EDW 68.5     2K 2.5 Ca  Hectorol 1 heparin 5000 Epo 10,000 right thigh graft  Recent labs: Hdg 10.1 10/16; Sept labs iPTH 213 Ca 9 P 1.6 (2.5 - August)) Hgb A!C 11.4 - July 2014  Imp/Plan: 1. Hyperglycemia- s/p insulin drip, resolved 2. Abd pain / diffuse "colitis" by CT- had similar CT in Jan this year , ? cause 3. ESRD TTS HD 4. MBD- cont vit D 5. Anemia- on aranesp 6. Hyperkalemia / hyponatremia- improved w insulin Rx   [name] Moselle  MD Pager [phone]    Cell  [phone] 03/17/2013, 11:10 AM    Recent Labs Lab 03/16/13 1843 03/16/13 2220 03/17/13 0400  NA 122* 129* 128*  K 6.2* 4.4 4.1  CL 85* 90* 89*  CO2 14* 17* 21  GLUCOSE 894* 209* 113*  BUN 59* 57* 56*  CREATININE 8.52* 8.75* 9.00*  CALCIUM 7.6* 8.0* 7.9*    Recent Labs Lab 03/16/13 1145  AST 44*  ALT 27  ALKPHOS 156*  BILITOT 0.8  PROT 6.7  ALBUMIN 3.3*    Recent Labs Lab 03/16/13 1145 03/17/13 0400  WBC 5.5 6.9  NEUTROABS 4.6  --   HGB 10.2* 10.0*  HCT 34.3* 30.4*  MCV 112.5* 101.7*  PLT 164 169   . albuterol  2.5 mg Nebulization Q6H  . ciprofloxacin  400 mg Intravenous Q24H  . clonazePAM  1 mg Oral QHS  . darbepoetin (ARANESP) injection - DIALYSIS  60 mcg Intravenous Q Tue-HD  . dextrose      . doxercalciferol  1 mcg Intravenous Q T,Th,Sa-HD  . gabapentin  100 mg Oral Daily  . heparin  5,000 Units Dialysis  Once in dialysis  . heparin  5,000 Units Subcutaneous Q8H  . insulin aspart  0-9 Units Subcutaneous Q4H  . insulin glargine  10 Units Subcutaneous Daily  . metronidazole  500 mg Intravenous Q8H  . mycophenolate  250 mg Oral BID  . predniSONE  5 mg Oral Daily  . sodium chloride  3 mL Intravenous Q12H  . sodium chloride  3 mL Intravenous Q12H   . sodium chloride 10 mL/hr at 03/16/13 2135   sodium chloride, sodium chloride, sodium chloride, albuterol, dextrose, dextrose, dextrose, dextrose, feeding supplement (NEPRO CARB STEADY), heparin, hydrOXYzine, lidocaine (PF), lidocaine-prilocaine, ondansetron (ZOFRAN) IV, ondansetron, pentafluoroprop-tetrafluoroeth, sodium chloride

## 2013-03-17 NOTE — Progress Notes (Signed)
[facility] PROGRESS NOTE  [name] RUE:[id number] DOB: October 30, 1970 DOA: 03/16/2013 PCP: [name], MD  Assessment/Plan: HHS: placed on insulin gtt, avoid IVF as dialysis patient, blood sugars lowered quickly in the ER- patient was able to be transitioned off the insulin gtt -after dialysis transfer to med surg floor -resumed SSI and lantus- monitor blood sugars  -HgBa1C pending  SOB - V/Q scan negative for PE, seems improved, try off O2 after dialysis  Abdominal pain due to colitis - patient says pain is different than previous hospitalizations; cipro/flagyl- GI consult as outpatient if patient improves or inpatient if she does not -patient denies diarrhea- so do not suspect c- diff colitis -she had similar CT scan in 05/2012- ? Related to cellcept -never had colonoscopy per patient  ESRD TTS with hyperkalemia/hyponatremia due to hyperglycemia - hx failed kidney/pancreas tx still on low dose immunosuppressive therapy. Per npehro she is still on cellcept bid and prograf bid, but nephrology consult in Sept showed these were too have been tapered to qd but infact stayed at bid last admission.  -Hyperkalemia should improve with lowering of blood sugar; monitor BMP  - HD 10/21   Hypertension/volume - holding home meds as hypotensive .  Anemia - Hgb 10.2 consistent with outpt labs - per nephro   Metabolic bone disease - per nephro   Nutrition - needs renal diet  Hyperkalemia- corrected as blood sugar lowered   Code Status: full Family Communication:  Disposition Plan: tx to med surg bed   Consultants:  renal  Procedures:  HD- 10/21  Antibiotics:  Cipro/flagyl 10/20-  HPI/Subjective: Patient still having abd pain No vomiting No diarrhea eating  Objective: Filed Vitals:   03/17/13 0442  BP: 105/56  Pulse: 67  Temp: 98 F (36.7 C)  Resp: 12    Intake/Output Summary (Last 24 hours) at 03/17/13 0801 Last data filed at 03/17/13 0445   Gross per 24 hour  Intake  208.9 ml  Output      0 ml  Net  208.9 ml   Filed Weights   03/16/13 2115 03/17/13 0442  Weight: 62.8 kg (138 lb 7.2 oz) 62.8 kg (138 lb 7.2 oz)    Exam:   General:  A+Ox3, NAD  Cardiovascular: rrr  Respiratory: clear anter  Abdomen: dec BS, tender to palpation  Musculoskeletal: moves all 4 ext   Data Reviewed: Basic Metabolic Panel:  Recent Labs Lab 03/16/13 1145 03/16/13 1843 03/16/13 2220 03/17/13 0400  NA 120* 122* 129* 128*  K 5.7* 6.2* 4.4 4.1  CL 81* 85* 90* 89*  CO2 18* 14* 17* 21  GLUCOSE 1076* 894* 209* 113*  BUN 52* 59* 57* 56*  CREATININE 8.29* 8.52* 8.75* 9.00*  CALCIUM 7.7* 7.6* 8.0* 7.9*   Liver Function Tests:  Recent Labs Lab 03/16/13 1145  AST 44*  ALT 27  ALKPHOS 156*  BILITOT 0.8  PROT 6.7  ALBUMIN 3.3*    Recent Labs Lab 03/16/13 1145  LIPASE 46   No results found for this basename: AMMONIA,  in the last 168 hours CBC:  Recent Labs Lab 03/16/13 1145 03/17/13 0400  WBC 5.5 6.9  NEUTROABS 4.6  --   HGB 10.2* 10.0*  HCT 34.3* 30.4*  MCV 112.5* 101.7*  PLT 164 169   Cardiac Enzymes:  Recent Labs Lab 03/16/13 1145  TROPONINI <0.30   BNP (last 3 results) No results found for this basename: PROBNP,  in the last 8760 hours CBG:  Recent Labs Lab 03/17/13  1610 03/17/13 0115 03/17/13 0228 03/17/13 0307 03/17/13 0444  GLUCAP 43* 77 37* 106* 187*    Recent Results (from the past 240 hour(s))  MRSA PCR SCREENING     Status: None   Collection Time    03/16/13  9:47 PM      Result Value Range Status   MRSA by PCR NEGATIVE  NEGATIVE Final   Comment:            The GeneXpert MRSA Assay (FDA     approved for NASAL specimens     only), is one component of a     comprehensive MRSA colonization     surveillance program. It is not     intended to diagnose MRSA     infection nor to guide or     monitor treatment for     MRSA infections.     Studies: Dg Chest 2 View  03/16/2013    CLINICAL DATA:  Shortness of breath  EXAM: CHEST  2 VIEW  COMPARISON:  02/19/2013  FINDINGS: The cardio pericardial silhouette is enlarged. There is right base atelectasis or infiltrate. Small right pleural effusion associated. Imaged bony structures of the thorax are intact.  IMPRESSION: Right base atelectasis or pneumonia with small right pleural effusion.   Electronically Signed   By: Kennith Center M.D.   On: 03/16/2013 17:34   Ct Abdomen Pelvis W Contrast  03/16/2013   CLINICAL DATA:  Diffuse stabbing abdominal pain with nausea and vomiting for 2 days, chronic dialysis  EXAM: CT ABDOMEN AND PELVIS WITH CONTRAST  TECHNIQUE: Multidetector CT imaging of the abdomen and pelvis was performed using the standard protocol following bolus administration of intravenous contrast.  CONTRAST:  OMNIPAQUE IOHEXOL 300 MG/ML  SOLN  COMPARISON:  06/10/2012  FINDINGS: There is mild scattered bilateral hazy ground-glass attenuation with more focal dependent atelectasis bilaterally. There is a tiny left and small to moderate right pleural effusion.  There are no focal hepatic abnormalities. There is mild periportal edema. The spleen, pancreas, and adrenal glands are normal. There is severe bilateral renal atrophy and cystic change. 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 There is a nonobstructive bowel gas pattern with mild diffuse colon wall thickening. There is also mild infiltration of the omentum and inflammatory change in the adjacent fat. These findings are similar to the prior study. The appendix is normal. Reproductive organs and bladder are normal. There is a small volume of ascites. Small transplanted kidney left flank unchanged. There are no acute musculoskeletal findings.  IMPRESSION: Diffuse colon wall thickening with mild infiltration of the omentum. The findings again suggest a diffuse colitis.   Electronically Signed   By: Esperanza Heir M.D.   On: 03/16/2013 17:45    Nm Pulmonary Perf And Vent  03/16/2013   CLINICAL DATA:  Shortness of breath  EXAM: NUCLEAR MEDICINE VENTILATION - PERFUSION LUNG SCAN  TECHNIQUE: Ventilation images were obtained in multiple projections using inhaled aerosol technetium 99 M DTPA. Perfusion images were obtained in multiple projections after intravenous injection of Tc-86m MAA.  COMPARISON:  02/27/2011.  RADIOPHARMACEUTICALS:  40 mCi Tc-45m DTPA aerosol and 6 mCi Tc-60m MAA  FINDINGS: Ventilation: No focal ventilation defect.  Perfusion: No wedge shaped peripheral perfusion defects to suggest acute pulmonary embolism.  IMPRESSION: Very low probability for pulmonary embolus. .   Electronically Signed   By: Kennith Center M.D.   On: 03/16/2013 17:21    Scheduled Meds: . albuterol  2.5 mg  Nebulization Q6H  . ciprofloxacin  400 mg Intravenous Q24H  . clonazePAM  1 mg Oral QHS  . darbepoetin (ARANESP) injection - DIALYSIS  60 mcg Intravenous Q Tue-HD  . dextrose      . doxercalciferol  1 mcg Intravenous Q T,Th,Sa-HD  . gabapentin  100 mg Oral Daily  . heparin  5,000 Units Dialysis Once in dialysis  . heparin  5,000 Units Subcutaneous Q8H  . insulin aspart  0-9 Units Subcutaneous Q4H  . insulin glargine  10 Units Subcutaneous Daily  . metronidazole  500 mg Intravenous Q8H  . mycophenolate  250 mg Oral BID  . predniSONE  5 mg Oral Daily  . sodium chloride  3 mL Intravenous Q12H  . sodium chloride  3 mL Intravenous Q12H   Continuous Infusions: . sodium chloride 10 mL/hr at 03/16/13 2135    Active Problems:   ESRD (end stage renal disease)   Abdominal pain   Obesity   Anemia of chronic renal failure   Hyperosmolar hyperglycemia    Time spent: 35    [facility], JESSICA  [facility] Pager [phone]. If 7PM-7AM, please contact night-coverage at [email or web address], password [facility] 03/17/2013, 8:01 AM  LOS: 1 day

## 2013-03-17 NOTE — Procedures (Signed)
I was present at this dialysis session. I have reviewed the session itself and made appropriate changes.   Vinson Moselle  MD Pager [phone]    Cell  [phone] 03/17/2013, 10:51 AM

## 2013-03-18 ENCOUNTER — Ambulatory Visit: Payer: Medicare Other | Admitting: Vascular Surgery

## 2013-03-18 DIAGNOSIS — Z9225 Personal history of immunosupression therapy: Secondary | ICD-10-CM

## 2013-03-18 DIAGNOSIS — E1029 Type 1 diabetes mellitus with other diabetic kidney complication: Secondary | ICD-10-CM

## 2013-03-18 DIAGNOSIS — N058 Unspecified nephritic syndrome with other morphologic changes: Secondary | ICD-10-CM

## 2013-03-18 DIAGNOSIS — E870 Hyperosmolality and hypernatremia: Secondary | ICD-10-CM

## 2013-03-18 LAB — BASIC METABOLIC PANEL
BUN: 25 mg/dL — ABNORMAL HIGH (ref 6–23)
CO2: 21 mEq/L (ref 19–32)
Chloride: 95 mEq/L — ABNORMAL LOW (ref 96–112)
Creatinine, Ser: 5.55 mg/dL — ABNORMAL HIGH (ref 0.50–1.10)
GFR calc Af Amer: 10 mL/min — ABNORMAL LOW (ref >90)
Glucose, Bld: 199 mg/dL — ABNORMAL HIGH (ref 70–99)
Potassium: 3.3 mEq/L — ABNORMAL LOW (ref 3.5–5.1)

## 2013-03-18 LAB — GLUCOSE, CAPILLARY
Glucose-Capillary: 113 mg/dL — ABNORMAL HIGH (ref 70–99)
Glucose-Capillary: 118 mg/dL — ABNORMAL HIGH (ref 70–99)
Glucose-Capillary: 76 mg/dL (ref 70–99)
Glucose-Capillary: 256 mg/dL — ABNORMAL HIGH (ref 70–99)

## 2013-03-18 MED ORDER — ALBUTEROL SULFATE (5 MG/ML) 0.5% IN NEBU
2.5000 mg | INHALATION_SOLUTION | Freq: Two times a day (BID) | RESPIRATORY_TRACT | Status: DC
  Administered 2013-03-18 – 2013-03-20 (×4): 2.5 mg via RESPIRATORY_TRACT
  Filled 2013-03-18 (×5): qty 0.5

## 2013-03-18 MED ORDER — POTASSIUM CHLORIDE CRYS ER 20 MEQ PO TBCR
20.0000 meq | EXTENDED_RELEASE_TABLET | Freq: Once | ORAL | Status: AC
  Administered 2013-03-18: 20 meq via ORAL
  Filled 2013-03-18: qty 1

## 2013-03-18 NOTE — Consult Note (Signed)
[facility] Consultation Note  Referring Provider: Dr. [name] Primary Care Physician:  [name], MD  Reason for Consultation:  Abdominal pain, colitis on CT scan  HPI: [name] is a 42 y.o. 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  She has had intermittent pain for a while, but developed vague different type of periumbilical pain a couple weeks ago.  CT scan shows diffuse colitis.  Patient denies diarrhea and denies blood in stool.  Was placed on ciprofloxacin and metronidazole for abnormal CT findings.   Past Medical History  Diagnosis Date  . Hyperlipidemia   . Alopecia   . Obesity   . Iron deficiency   . 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 RLS (restless legs syndrome)   . Respiratory failure   . Injury of conjunctiva and corneal abrasion of right eye without foreign body   . Cellulitis   . Dialysis patient     Tues; Thurs; Sat; [place]  . 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 Anemia   . Pneumonia 2012    "double"  . Blood transfusion 2004    "when I had my transplant"  . Migraine   . 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 Hyperparathyroidism, secondary   . Diabetic retinopathy   . Hypertension     takes Metoprolol and Exforge daily  . Depression     takes Klonopin nightly  . Diabetes mellitus type 1     Pt states diagnosed at age 49 with prior episodes of DKA. S/p pancreatic transplant   . GERD (gastroesophageal reflux disease)     takes Protonix daily  . PONV (postoperative nausea and vomiting)   . Bronchitis   . Migraine   . Asthma   . Shortness of breath   . Emphysema of lung   . DKA (diabetic ketoacidoses) 07/02/2012  . Angina     none in past 2 years.  . Renal insufficiency     Past Surgical History   Procedure Laterality Date  . 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 Thyroglobulin      x 7  . Av fistula placement      right upper arm  . Cholecystectomy  1995  .  hd graft placement/removal      "had 2 in my left upper arm"  . Eye surgery    . Retinopathy surgery    . Tooth extraction  10/10/11    X's two  . Insertion of dialysis catheter  12/08/2011    Procedure: INSERTION OF DIALYSIS CATHETER;  Surgeon: Chuck Hint, MD;  Location: [facility] OR;  Service: Vascular;  Laterality: Left;  . Av fistula placement  01/22/2012    Procedure: INSERTION OF ARTERIOVENOUS (AV) GORE-TEX GRAFT ARM;  Surgeon: Chuck Hint, MD;  Location: [facility] OR;  Service: Vascular;  Laterality: Left;  Ultrasound guided.  . Av fistula placement  03/14/2012    Procedure: INSERTION OF ARTERIOVENOUS (AV) GORE-TEX GRAFT THIGH;  Surgeon: Chuck Hint, MD;  Location: [facility] OR;  Service: Vascular;  Laterality: Right;  . False aneurysm repair Right 01/02/2013    Procedure: Excision of lymphocele in right thigh.;  Surgeon: Chuck Hint, MD;  Location: [facility] OR;  Service: Vascular;  Laterality: Right;  .  Carpal tunnel release Right 03/02/2013    Procedure: CARPAL TUNNEL RELEASE;  Surgeon: Tami Ribas, MD;  Location: [facility];  Service: Orthopedics;  Laterality: Right;    Prior to Admission medications   Medication Sig Start Date End Date Taking? Authorizing Provider  albuterol (PROVENTIL HFA;VENTOLIN HFA) 108 (90 BASE) MCG/ACT inhaler Inhale 2 puffs into the lungs every 4 (four) hours as needed for wheezing or shortness of breath (as per home regimen). 02/20/13  Yes Esperanza Sheets, MD  aspirin EC 81 MG tablet Take 81 mg by mouth daily.   Yes Historical Provider, MD  calcium acetate (PHOSLO) 667 MG capsule Take 2,001-2,668 mg by mouth 3 (three) times daily with meals. Dosage depends on meal consumed 03/12/11  Yes Historical Provider, MD  clonazePAM  (KLONOPIN) 0.5 MG tablet Take 1 mg by mouth at bedtime.    Yes Historical Provider, MD  diphenhydrAMINE (BENADRYL) 50 MG tablet Take 50 mg by mouth at bedtime as needed for allergies.    Yes Historical Provider, MD  EXFORGE 10-160 MG per tablet Take 1 tablet by mouth at bedtime.  06/15/12  Yes Historical Provider, MD  gabapentin (NEURONTIN) 100 MG capsule Take 100 mg by mouth daily.   Yes Historical Provider, MD  Insulin Aspart (NOVOLOG FLEXPEN [place]) Inject 4 Units into the skin 2 (two) times daily. Takes 4 units in am and at night, if pt eats lunch she will take insulin before she eats 01/04/13  Yes Rhae Lerner, MD  insulin glargine (LANTUS) 100 units/mL SOLN Inject 5 Units into the skin 2 (two) times daily. 01/04/13  Yes Rhae Lerner, MD  metoprolol succinate (TOPROL-XL) 100 MG 24 hr tablet Take 100 mg by mouth at bedtime. Take with or immediately following a meal.   Yes Historical Provider, MD  multivitamin (RENA-VIT) TABS tablet Take 1 tablet by mouth daily.   Yes Historical Provider, MD  mycophenolate (CELLCEPT) 250 MG capsule Take 250 mg by mouth 2 (two) times daily.   Yes Historical Provider, MD  pantoprazole (PROTONIX) 40 MG tablet Take 40 mg by mouth daily.   Yes Historical Provider, MD  predniSONE (DELTASONE) 5 MG tablet Take 5 mg by mouth daily.  01/21/11  Yes Historical Provider, MD  tacrolimus (PROGRAF) 1 MG capsule Take 1 mg by mouth 2 (two) times daily.   Yes Historical Provider, MD  zolpidem (AMBIEN CR) 12.5 MG CR tablet Take 12.5 mg by mouth at bedtime as needed for sleep.   Yes Historical Provider, MD    Current Facility-Administered Medications  Medication Dose Route Frequency Provider Last Rate Last Dose  . 0.9 %  sodium chloride infusion  100 mL Intravenous PRN Sheffield Slider, PA-C      . 0.9 %  sodium chloride infusion  100 mL Intravenous PRN Sheffield Slider, PA-C      . 0.9 %  sodium chloride infusion  250 mL Intravenous PRN Joseph Art, DO       . albuterol (PROVENTIL) (5 MG/ML) 0.5% nebulizer solution 2.5 mg  2.5 mg Nebulization Q2H PRN Joseph Art, DO      . albuterol (PROVENTIL) (5 MG/ML) 0.5% nebulizer solution 2.5 mg  2.5 mg Nebulization BID [name], MD   2.5 mg at 03/18/13 0830  . ciprofloxacin (CIPRO) IVPB 400 mg  400 mg Intravenous Q24H Hessie Diener [place], RPH   400 mg at 03/17/13 2054  . clonazePAM (KLONOPIN) tablet 1 mg  1 mg Oral QHS Shanda Bumps  U Vann, DO   1 mg at 03/17/13 2205  . darbepoetin (ARANESP) injection 60 mcg  60 mcg Intravenous Q Tue-HD Sheffield Slider, PA-C   60 mcg at 03/17/13 1191  . dextrose (GLUTOSE) 40 % oral gel 37.5 g  1 Tube Oral PRN Leda Gauze, NP      . dextrose 50 % solution 25 mL  25 mL Intravenous PRN Leda Gauze, NP   25 mL at 03/17/13 0239  . dextrose 50 % solution 50 mL  50 mL Intravenous PRN Leda Gauze, NP      . doxercalciferol (HECTOROL) injection 1 mcg  1 mcg Intravenous Q T,Th,Sa-HD Sheffield Slider, PA-C   1 mcg at 03/17/13 0940  . feeding supplement (NEPRO CARB STEADY) liquid 237 mL  237 mL Oral PRN Sheffield Slider, PA-C      . gabapentin (NEURONTIN) capsule 100 mg  100 mg Oral Daily Joseph Art, DO   100 mg at 03/18/13 1050  . heparin injection 1,000 Units  1,000 Units Dialysis PRN Sheffield Slider, PA-C      . heparin injection 5,000 Units  5,000 Units Dialysis Once in dialysis Sheffield Slider, PA-C      . heparin injection 5,000 Units  5,000 Units Subcutaneous Q8H Joseph Art, DO      . HYDROmorphone (DILAUDID) injection 0.5 mg  0.5 mg Intravenous Q4H PRN Joseph Art, DO   0.5 mg at 03/18/13 1236  . hydrOXYzine (ATARAX/VISTARIL) tablet 25 mg  25 mg Oral TID PRN [name], MD   25 mg at 03/17/13 1048  . insulin aspart (novoLOG) injection 0-5 Units  0-5 Units Subcutaneous QHS Jessica U Vann, DO      . insulin aspart (novoLOG) injection 0-9 Units  0-9 Units Subcutaneous TID WC Joseph Art, DO   9 Units at 03/17/13 1748  .  insulin aspart (novoLOG) injection 3 Units  3 Units Subcutaneous TID WC Joseph Art, DO   3 Units at 03/18/13 1236  . insulin glargine (LANTUS) injection 25 Units  25 Units Subcutaneous QHS Beather Arbour [place], NP      . lidocaine (PF) (XYLOCAINE) 1 % injection 5 mL  5 mL Intradermal PRN Sheffield Slider, PA-C      . lidocaine-prilocaine (EMLA) cream 1 application  1 application Topical PRN Sheffield Slider, PA-C      . metroNIDAZOLE (FLAGYL) IVPB 500 mg  500 mg Intravenous Q8H Jessica U Vann, DO 100 mL/hr at 03/18/13 1050 500 mg at 03/18/13 1050  . mycophenolate (CELLCEPT) capsule 250 mg  250 mg Oral BID Joseph Art, DO   250 mg at 03/18/13 1050  . ondansetron (ZOFRAN) tablet 4 mg  4 mg Oral Q6H PRN Joseph Art, DO       Or  . ondansetron (ZOFRAN) injection 4 mg  4 mg Intravenous Q6H PRN Joseph Art, DO   4 mg at 03/17/13 0655  . pentafluoroprop-tetrafluoroeth (GEBAUERS) aerosol 1 application  1 application Topical PRN Sheffield Slider, PA-C      . predniSONE (DELTASONE) tablet 5 mg  5 mg Oral Daily Jessica U Vann, DO   5 mg at 03/18/13 1050  . sodium chloride 0.9 % injection 3 mL  3 mL Intravenous Q12H Joseph Art, DO   3 mL at 03/16/13 2227  . sodium chloride 0.9 % injection 3 mL  3 mL Intravenous Q12H Joseph Art, DO  3 mL at 03/18/13 1051  . sodium chloride 0.9 % injection 3 mL  3 mL Intravenous PRN Joseph Art, DO        Allergies as of 03/16/2013 - Review Complete 03/16/2013  Allergen Reaction Noted  . Depakote [divalproex sodium] Other (See Comments) 09/03/2012  . Morphine and related Nausea And Vomiting and Other (See Comments) 01/18/2011  . Penicillins Anaphylaxis 04/23/2011  . Tramadol Nausea And Vomiting 01/18/2011  . Vicodin [hydrocodone-acetaminophen] Itching and Rash 12/06/2011    Family History  Problem Relation Age of Onset  . Hypertension Mother   . Kidney disease Mother   . Diabetes Mother   . Hypertension Father   . Kidney disease Father    . Diabetes Father     History   Social History  . Marital Status: Single    Spouse Name: N/A    Number of Children: N/A  . Years of Education: N/A   Occupational History  . disabled    Social History Main Topics  . Smoking status: Never Smoker   . Smokeless tobacco: Never Used  . Alcohol Use: No  . Drug Use: No  . Sexual Activity: Yes    Birth Control/ Protection: None   Other Topics Concern  . Not on file   Social History Narrative   Lives at home with fiance, has a 17yo daughter. Ambulatory without cane or walker. Never smoker, no drugs or alcohol. Lost mom last year, has no brothers or sisters.     Review of Systems: As per HPI, all others negative  Physical Exam: Vital signs in last 24 hours: Temp:  [97.2 F (36.2 C)-98.2 F (36.8 C)] 98.2 F (36.8 C) (10/22 1334) Pulse Rate:  [67-89] 87 (10/22 1334) Resp:  [18-20] 18 (10/22 1334) BP: (127-149)/(15-80) 127/67 mmHg (10/22 1334) SpO2:  [97 %-100 %] 100 % (10/22 1334) Last BM Date: 03/16/13 General:   Alert,  Chronically ill-appearing, older-appearing than stated age, pleasant and cooperative in NAD Head:  Normocephalic and atraumatic. Eyes:  Sclera clear, no icterus.   Conjunctiva pink. Ears:  Normal auditory acuity. Nose:  No deformity, discharge,  or lesions. Mouth:  No deformity or lesions.  Oropharynx pink & moist. Neck:  Supple; no masses or thyromegaly. Lungs:  Clear throughout to auscultation.   No wheezes, crackles, or rhonchi. No acute distress. Heart:  Regular rate and rhythm; no murmurs, clicks, rubs,  or gallops. Abdomen:  Soft, old surgical scars , nondistended, mild diffuse periumbilical tenderness. No masses, hepatosplenomegaly or hernias noted. Normal bowel sounds, without guarding, and without rebound.     Msk:  Symmetrical without gross deformities. Normal posture. Pulses:  Normal pulses noted. Extremities:  Left AVF; Without clubbing or edema. Neurologic:  Alert and  oriented x4;   Diffusely weak, otherwise grossly normal neurologically. Skin:  Intact without significant lesions or rashes. Cervical Nodes:  No significant cervical adenopathy. Psych:  Alert and cooperative. Normal mood and affect.   Lab Results:  Recent Labs  03/16/13 1145 03/17/13 0400 03/17/13 2300  WBC 5.5 6.9 5.4  HGB 10.2* 10.0* 9.9*  HCT 34.3* 30.4* 30.4*  PLT 164 169 139*   BMET  Recent Labs  03/16/13 2220 03/17/13 0400 03/17/13 2300  NA 129* 128* 136  K 4.4 4.1 3.3*  CL 90* 89* 95*  CO2 17* 21 21  GLUCOSE 209* 113* 199*  BUN 57* 56* 25*  CREATININE 8.75* 9.00* 5.55*  CALCIUM 8.0* 7.9* 7.9*   LFT  Recent Labs  03/16/13  1145  PROT 6.7  ALBUMIN 3.3*  AST 44*  ALT 27  ALKPHOS 156*  BILITOT 0.8   PT/INR No results found for this basename: LABPROT, INR,  in the last 72 hours  Studies/Results: Dg Chest 2 View  03/16/2013   CLINICAL DATA:  Shortness of breath  EXAM: CHEST  2 VIEW  COMPARISON:  02/19/2013  FINDINGS: The cardio pericardial silhouette is enlarged. There is right base atelectasis or infiltrate. Small right pleural effusion associated. Imaged bony structures of the thorax are intact.  IMPRESSION: Right base atelectasis or pneumonia with small right pleural effusion.   Electronically Signed   By: Kennith Center M.D.   On: 03/16/2013 17:34   Ct Abdomen Pelvis W Contrast  03/16/2013   CLINICAL DATA:  Diffuse stabbing abdominal pain with nausea and vomiting for 2 days, chronic dialysis  EXAM: CT ABDOMEN AND PELVIS WITH CONTRAST  TECHNIQUE: Multidetector CT imaging of the abdomen and pelvis was performed using the standard protocol following bolus administration of intravenous contrast.  CONTRAST:  OMNIPAQUE IOHEXOL 300 MG/ML  SOLN  COMPARISON:  06/10/2012  FINDINGS: There is mild scattered bilateral hazy ground-glass attenuation with more focal dependent atelectasis bilaterally. There is a tiny left and small to moderate right pleural effusion.  There are no  focal hepatic abnormalities. There is mild periportal edema. The spleen, pancreas, and adrenal glands are normal. There is severe bilateral renal atrophy and cystic change. 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 There is a nonobstructive bowel gas pattern with mild diffuse colon wall thickening. There is also mild infiltration of the omentum and inflammatory change in the adjacent fat. These findings are similar to the prior study. The appendix is normal. Reproductive organs and bladder are normal. There is a small volume of ascites. Small transplanted kidney left flank unchanged. There are no acute musculoskeletal findings.  IMPRESSION: Diffuse colon wall thickening with mild infiltration of the omentum. The findings again suggest a diffuse colitis.   Electronically Signed   By: Esperanza Heir M.D.   On: 03/16/2013 17:45   Nm Pulmonary Perf And Vent  03/16/2013   CLINICAL DATA:  Shortness of breath  EXAM: NUCLEAR MEDICINE VENTILATION - PERFUSION LUNG SCAN  TECHNIQUE: Ventilation images were obtained in multiple projections using inhaled aerosol technetium 99 M DTPA. Perfusion images were obtained in multiple projections after intravenous injection of Tc-37m MAA.  COMPARISON:  02/27/2011.  RADIOPHARMACEUTICALS:  40 mCi Tc-78m DTPA aerosol and 6 mCi Tc-84m MAA  FINDINGS: Ventilation: No focal ventilation defect.  Perfusion: No wedge shaped peripheral perfusion defects to suggest acute pulmonary embolism.  IMPRESSION: Very low probability for pulmonary embolus. .   Electronically Signed   By: Kennith Center M.D.   On: 03/16/2013 17:21    Impression:  1.  Abdominal pain. 2.  Abnormal CT abdomen, diffuse colonic thickening.  Patient has no diarrhea or blood in stool.  Plan:  1.  Sigmoidoscopy for further evaluation.  She has dialysis tomorrow, so will plan to do this Friday.  Patient is in agreement with this plan. 2.  Will follow; thank you for the consult.   LOS:  2 days   Daouda Lonzo M  03/18/2013, 4:24 PM

## 2013-03-18 NOTE — Progress Notes (Signed)
  [facility] Progress Note   Subjective: still w abd pain, no diarrhea  Physical Exam:  Blood pressure 149/80, pulse 67, temperature 97.2 F (36.2 C), temperature source Axillary, resp. rate 20, height 5\' 1"  (1.549 m), weight 67.132 kg (148 lb), last menstrual period 03/14/2013, SpO2 100.00%.  gne- alert  neck- no JVD  chest- clear bilat  cor- reg 2/6 SEM  abd- mild diffuse tenderness, +BS, no ascites  ext- no edema  access- R thigh AVG no erythema or drainage, patent  Dialysis Orders: ([facility]) Optiflx 160 3.75 hr    EDW 68.5     2K 2.5 Ca  heparin 5000  Hectorol 1 Epo 10,000 right thigh graft  Recent labs: Hdg 10.1 10/16; Sept labs iPTH 213 Ca 9 P 1.6 (2.5 - August)) Hgb A!C 11.4 - July 2014  Imp/Plan: 1. Abd pain / "colitis" by CT- pt is immunosuppressed, unclear cause, would consider GI consult  2. Hyperglycemia- s/p insulin drip, resolved 3. ESRD TTS HD 4. MBD- cont vit D 5. Anemia- on aranesp 6. Hyperkalemia / hyponatremia- improved w insulin Rx 7. Hx failed KP transplant- is on low-dose immunosuppression per [facility] doctors w prograf, prednisone and cellcept   Vinson Moselle  MD Pager [phone]    Cell  [phone] 03/18/2013, 11:54 AM    Recent Labs Lab 03/16/13 2220 03/17/13 0400 03/17/13 2300  NA 129* 128* 136  K 4.4 4.1 3.3*  CL 90* 89* 95*  CO2 17* 21 21  GLUCOSE 209* 113* 199*  BUN 57* 56* 25*  CREATININE 8.75* 9.00* 5.55*  CALCIUM 8.0* 7.9* 7.9*    Recent Labs Lab 03/16/13 1145  AST 44*  ALT 27  ALKPHOS 156*  BILITOT 0.8  PROT 6.7  ALBUMIN 3.3*    Recent Labs Lab 03/16/13 1145 03/17/13 0400 03/17/13 2300  WBC 5.5 6.9 5.4  NEUTROABS 4.6  --   --   HGB 10.2* 10.0* 9.9*  HCT 34.3* 30.4* 30.4*  MCV 112.5* 101.7* 103.1*  PLT 164 169 139*   . albuterol  2.5 mg Nebulization BID  . ciprofloxacin  400 mg Intravenous Q24H  . clonazePAM  1 mg Oral QHS  . darbepoetin (ARANESP) injection - DIALYSIS  60 mcg Intravenous Q  Tue-HD  . doxercalciferol  1 mcg Intravenous Q T,Th,Sa-HD  . gabapentin  100 mg Oral Daily  . heparin  5,000 Units Dialysis Once in dialysis  . heparin  5,000 Units Subcutaneous Q8H  . insulin aspart  0-5 Units Subcutaneous QHS  . insulin aspart  0-9 Units Subcutaneous TID WC  . insulin aspart  3 Units Subcutaneous TID WC  . insulin glargine  25 Units Subcutaneous QHS  . metronidazole  500 mg Intravenous Q8H  . mycophenolate  250 mg Oral BID  . predniSONE  5 mg Oral Daily  . sodium chloride  3 mL Intravenous Q12H  . sodium chloride  3 mL Intravenous Q12H     sodium chloride, sodium chloride, sodium chloride, albuterol, dextrose, dextrose, dextrose, feeding supplement (NEPRO CARB STEADY), heparin, HYDROmorphone (DILAUDID) injection, hydrOXYzine, lidocaine (PF), lidocaine-prilocaine, ondansetron (ZOFRAN) IV, ondansetron, pentafluoroprop-tetrafluoroeth, sodium chloride

## 2013-03-18 NOTE — Progress Notes (Signed)
[facility] PROGRESS NOTE  [name] RUE:[id number] DOB: 1970-08-14 DOA: 03/16/2013 PCP: [name], MD  Assessment/Plan: 1. Diabetes mellitus, poorly controlled, having hemoglobin A1c of 10.0.  Patient hyperglycemic overnight, having blood sugars over 600. She was administered 20 units of NovoLog and 15 units of Lantus. I suspect that underlying infectious process precipitating hyperglycemia. Blood sugars are better controlled this morning.   2. Abdominal pain due to colitis  She continues to complain of abdominal pain this morning. With her lack of significant improvement in setting of history of renal transplant and chronically immunosuppressed, will consult gastroenterology. CT scan of abdomen showing diffuse wall thickening with mild infiltration of the omentum. These findings suggesting diffuse colitis per radiology report. She is on Cipro and Flagyl therapy. Of note she denies diarrhea, making C. difficile colitis unlikely.  3. ESRD   Patient with a history of kidney transplant in 2004 . Dr. Arlean Hopping of nephrology consulted, patient undergoing hemodialysis during this hospitalization. She is on CellCept and prednisone therapy,    4. Hypertension Patient having systolic blood pressures fluctuate between 120s and 140s    5. Anemia secondary to chronic disease - Hgb 9.9, remaining stable.     Code Status: Full code Family Communication: Plan discussed with patient at bedside Disposition Plan: GI consult   Consultants:  Nephrology  Gastroenterology   Antibiotics:  Ciprofloxacin  Flagyl  HPI/Subjective: Patient continues to feel poorly, reporting ongoing abdominal pain. She denies diarrhea though reports episode of nausea vomiting yesterday. Overnight she was hyperglycemic having blood sugars greater than 600, administered sliding scale with blood sugars improving this morning.  Objective: Filed Vitals:   03/18/13 0601  BP: 149/80  Pulse: 67   Temp: 97.2 F (36.2 C)  Resp: 20    Intake/Output Summary (Last 24 hours) at 03/18/13 1208 Last data filed at 03/18/13 0942  Gross per 24 hour  Intake   1480 ml  Output   5000 ml  Net  -3520 ml   Filed Weights   03/17/13 0905 03/17/13 1254 03/17/13 1414  Weight: 73 kg (160 lb 15 oz) 67 kg (147 lb 11.3 oz) 67.132 kg (148 lb)    Exam:   General:  Patient is in no acute distress, though reports minimal improvement since yesterday.  Cardiovascular: Regular rate rhythm normal S1-S2  Respiratory: Lungs clear to auscultation bilaterally no wheezing rhonchi oral  Abdomen: Patient has some pain to palpation over epigastric region, no guarding or rebound tenderness  Musculoskeletal: Present range of motion to all extremities   Data Reviewed: Basic Metabolic Panel:  Recent Labs Lab 03/16/13 1145 03/16/13 1843 03/16/13 2220 03/17/13 0400 03/17/13 2300  NA 120* 122* 129* 128* 136  K 5.7* 6.2* 4.4 4.1 3.3*  CL 81* 85* 90* 89* 95*  CO2 18* 14* 17* 21 21  GLUCOSE 1076* 894* 209* 113* 199*  BUN 52* 59* 57* 56* 25*  CREATININE 8.29* 8.52* 8.75* 9.00* 5.55*  CALCIUM 7.7* 7.6* 8.0* 7.9* 7.9*   Liver Function Tests:  Recent Labs Lab 03/16/13 1145  AST 44*  ALT 27  ALKPHOS 156*  BILITOT 0.8  PROT 6.7  ALBUMIN 3.3*    Recent Labs Lab 03/16/13 1145  LIPASE 46   No results found for this basename: AMMONIA,  in the last 168 hours CBC:  Recent Labs Lab 03/16/13 1145 03/17/13 0400 03/17/13 2300  WBC 5.5 6.9 5.4  NEUTROABS 4.6  --   --   HGB 10.2* 10.0* 9.9*  HCT 34.3* 30.4*  30.4*  MCV 112.5* 101.7* 103.1*  PLT 164 169 139*   Cardiac Enzymes:  Recent Labs Lab 03/16/13 1145  TROPONINI <0.30   BNP (last 3 results) No results found for this basename: PROBNP,  in the last 8760 hours CBG:  Recent Labs Lab 03/17/13 1853 03/17/13 2123 03/18/13 0618 03/18/13 0758 03/18/13 1147  GLUCAP >600* 390* 98 113* 118*    Recent Results (from the past 240  hour(s))  MRSA PCR SCREENING     Status: None   Collection Time    03/16/13  9:47 PM      Result Value Range Status   MRSA by PCR NEGATIVE  NEGATIVE Final   Comment:            The GeneXpert MRSA Assay (FDA     approved for NASAL specimens     only), is one component of a     comprehensive MRSA colonization     surveillance program. It is not     intended to diagnose MRSA     infection nor to guide or     monitor treatment for     MRSA infections.     Studies: Dg Chest 2 View  03/16/2013   CLINICAL DATA:  Shortness of breath  EXAM: CHEST  2 VIEW  COMPARISON:  02/19/2013  FINDINGS: The cardio pericardial silhouette is enlarged. There is right base atelectasis or infiltrate. Small right pleural effusion associated. Imaged bony structures of the thorax are intact.  IMPRESSION: Right base atelectasis or pneumonia with small right pleural effusion.   Electronically Signed   By: Kennith Center M.D.   On: 03/16/2013 17:34   Ct Abdomen Pelvis W Contrast  03/16/2013   CLINICAL DATA:  Diffuse stabbing abdominal pain with nausea and vomiting for 2 days, chronic dialysis  EXAM: CT ABDOMEN AND PELVIS WITH CONTRAST  TECHNIQUE: Multidetector CT imaging of the abdomen and pelvis was performed using the standard protocol following bolus administration of intravenous contrast.  CONTRAST:  OMNIPAQUE IOHEXOL 300 MG/ML  SOLN  COMPARISON:  06/10/2012  FINDINGS: There is mild scattered bilateral hazy ground-glass attenuation with more focal dependent atelectasis bilaterally. There is a tiny left and small to moderate right pleural effusion.  There are no focal hepatic abnormalities. There is mild periportal edema. The spleen, pancreas, and adrenal glands are normal. There is severe bilateral renal atrophy and cystic change. 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 There is a nonobstructive bowel gas pattern with mild diffuse colon wall thickening. There is also mild  infiltration of the omentum and inflammatory change in the adjacent fat. These findings are similar to the prior study. The appendix is normal. Reproductive organs and bladder are normal. There is a small volume of ascites. Small transplanted kidney left flank unchanged. There are no acute musculoskeletal findings.  IMPRESSION: Diffuse colon wall thickening with mild infiltration of the omentum. The findings again suggest a diffuse colitis.   Electronically Signed   By: Esperanza Heir M.D.   On: 03/16/2013 17:45   Nm Pulmonary Perf And Vent  03/16/2013   CLINICAL DATA:  Shortness of breath  EXAM: NUCLEAR MEDICINE VENTILATION - PERFUSION LUNG SCAN  TECHNIQUE: Ventilation images were obtained in multiple projections using inhaled aerosol technetium 99 M DTPA. Perfusion images were obtained in multiple projections after intravenous injection of Tc-51m MAA.  COMPARISON:  02/27/2011.  RADIOPHARMACEUTICALS:  40 mCi Tc-10m DTPA aerosol and 6 mCi Tc-13m MAA  FINDINGS: Ventilation: No focal ventilation  defect.  Perfusion: No wedge shaped peripheral perfusion defects to suggest acute pulmonary embolism.  IMPRESSION: Very low probability for pulmonary embolus. .   Electronically Signed   By: Kennith Center M.D.   On: 03/16/2013 17:21    Scheduled Meds: . albuterol  2.5 mg Nebulization BID  . ciprofloxacin  400 mg Intravenous Q24H  . clonazePAM  1 mg Oral QHS  . darbepoetin (ARANESP) injection - DIALYSIS  60 mcg Intravenous Q Tue-HD  . doxercalciferol  1 mcg Intravenous Q T,Th,Sa-HD  . gabapentin  100 mg Oral Daily  . heparin  5,000 Units Dialysis Once in dialysis  . heparin  5,000 Units Subcutaneous Q8H  . insulin aspart  0-5 Units Subcutaneous QHS  . insulin aspart  0-9 Units Subcutaneous TID WC  . insulin aspart  3 Units Subcutaneous TID WC  . insulin glargine  25 Units Subcutaneous QHS  . metronidazole  500 mg Intravenous Q8H  . mycophenolate  250 mg Oral BID  . predniSONE  5 mg Oral Daily  . sodium  chloride  3 mL Intravenous Q12H  . sodium chloride  3 mL Intravenous Q12H   Continuous Infusions:   Active Problems:   ESRD (end stage renal disease)   Abdominal pain   Obesity   Anemia of chronic renal failure   Hyperosmolar hyperglycemia    Time spent: 35 minutes    [name]  [facility] Pager [phone]. If 7PM-7AM, please contact night-coverage at [email or web address], password [facility] 03/18/2013, 12:08 PM  LOS: 2 days

## 2013-03-19 DIAGNOSIS — N186 End stage renal disease: Secondary | ICD-10-CM

## 2013-03-19 LAB — RENAL FUNCTION PANEL
Albumin: 3.1 g/dL — ABNORMAL LOW (ref 3.5–5.2)
BUN: 37 mg/dL — ABNORMAL HIGH (ref 6–23)
CO2: 23 mEq/L (ref 19–32)
Calcium: 7.5 mg/dL — ABNORMAL LOW (ref 8.4–10.5)
Chloride: 100 mEq/L (ref 96–112)
Creatinine, Ser: 8.31 mg/dL — ABNORMAL HIGH (ref 0.50–1.10)
GFR calc Af Amer: 6 mL/min — ABNORMAL LOW (ref >90)
GFR calc non Af Amer: 5 mL/min — ABNORMAL LOW (ref >90)
Glucose, Bld: 47 mg/dL — ABNORMAL LOW (ref 70–99)
Phosphorus: 3.9 mg/dL (ref 2.3–4.6)
Potassium: 3.9 mEq/L (ref 3.5–5.1)
Sodium: 138 mEq/L (ref 135–145)

## 2013-03-19 LAB — GLUCOSE, CAPILLARY
Glucose-Capillary: 63 mg/dL — ABNORMAL LOW (ref 70–99)
Glucose-Capillary: 75 mg/dL (ref 70–99)

## 2013-03-19 LAB — CBC
HCT: 31.5 % — ABNORMAL LOW (ref 36.0–46.0)
Hemoglobin: 10.1 g/dL — ABNORMAL LOW (ref 12.0–15.0)
MCH: 33.8 pg (ref 26.0–34.0)
MCHC: 32.1 g/dL (ref 30.0–36.0)
MCV: 105.4 fL — ABNORMAL HIGH (ref 78.0–100.0)
Platelets: 156 10*3/uL (ref 150–400)
RBC: 2.99 MIL/uL — ABNORMAL LOW (ref 3.87–5.11)
RDW: 18 % — ABNORMAL HIGH (ref 11.5–15.5)
WBC: 4.9 10*3/uL (ref 4.0–10.5)

## 2013-03-19 MED ORDER — HYDROMORPHONE HCL PF 1 MG/ML IJ SOLN
INTRAMUSCULAR | Status: AC
  Administered 2013-03-19: 0.5 mg via INTRAVENOUS
  Filled 2013-03-19: qty 1

## 2013-03-19 MED ORDER — HEPARIN SODIUM (PORCINE) 1000 UNIT/ML DIALYSIS: 5000.0000 [IU] | Freq: Once | INTRAMUSCULAR | Status: DC

## 2013-03-19 MED ORDER — SODIUM CHLORIDE 0.9 % IV SOLN: 100.0000 mL | INTRAVENOUS | Status: DC | PRN

## 2013-03-19 MED ORDER — LIDOCAINE-PRILOCAINE 2.5-2.5 % EX CREA: 1.0000 "application " | TOPICAL_CREAM | CUTANEOUS | Status: DC | PRN

## 2013-03-19 MED ORDER — NEPRO/CARBSTEADY PO LIQD: 237.0000 mL | ORAL | Status: DC | PRN

## 2013-03-19 MED ORDER — LIDOCAINE HCL (PF) 1 % IJ SOLN: 5.0000 mL | INTRAMUSCULAR | Status: DC | PRN

## 2013-03-19 MED ORDER — PENTAFLUOROPROP-TETRAFLUOROETH EX AERO: 1.0000 "application " | INHALATION_SPRAY | CUTANEOUS | Status: DC | PRN

## 2013-03-19 MED ORDER — RENA-VITE PO TABS
1.0000 | ORAL_TABLET | Freq: Every day | ORAL | Status: DC
  Administered 2013-03-20: 1 via ORAL
  Filled 2013-03-19 (×3): qty 1

## 2013-03-19 MED ORDER — ALTEPLASE 2 MG IJ SOLR
2.0000 mg | Freq: Once | INTRAMUSCULAR | Status: AC | PRN
  Filled 2013-03-19: qty 2

## 2013-03-19 MED ORDER — SODIUM CHLORIDE 0.9 % IV SOLN: INTRAVENOUS | Status: DC

## 2013-03-19 MED ORDER — HEPARIN SODIUM (PORCINE) 1000 UNIT/ML DIALYSIS: 1000.0000 [IU] | INTRAMUSCULAR | Status: DC | PRN

## 2013-03-19 MED ORDER — DOXERCALCIFEROL 4 MCG/2ML IV SOLN
INTRAVENOUS | Status: AC
  Administered 2013-03-19: 1 ug via INTRAVENOUS
  Filled 2013-03-19: qty 2

## 2013-03-19 NOTE — Procedures (Signed)
I was present at this dialysis session. I have reviewed the session itself and made appropriate changes.   [name] Moselle  MD Pager [phone]    Cell  [phone] 03/19/2013, 9:27 AM

## 2013-03-19 NOTE — Progress Notes (Signed)
ANTIBIOTIC CONSULT NOTE - FOLLOW UP  Pharmacy Consult for Cipro Indication: colitis  Allergies  Allergen Reactions  . Depakote [Divalproex Sodium] Other (See Comments)    Pt gets "delirious"  . Morphine And Related Nausea And Vomiting and Other (See Comments)    "makes me delirious"  . Penicillins Anaphylaxis  . Tramadol Nausea And Vomiting  . Vicodin [Hydrocodone-Acetaminophen] Itching and Rash   Vital Signs: Temp: 98.1 F (36.7 C) (10/23 0527) Temp src: Oral (10/23 0527) BP: 147/85 mmHg (10/23 1030) Pulse Rate: 88 (10/23 1030) Intake/Output from previous day: 10/22 0701 - 10/23 0700 In: 770 [P.O.:360; I.V.:110; IV Piggyback:300] Out: -  Intake/Output from this shift:    Labs:  Recent Labs  03/17/13 0400 03/17/13 2300 03/19/13 0753  WBC 6.9 5.4 4.9  HGB 10.0* 9.9* 10.1*  PLT 169 139* 156  CREATININE 9.00* 5.55* 8.31*   Medical History: Past Medical History  Diagnosis Date  . Hyperlipidemia   . Alopecia   . Obesity   . Iron deficiency   . 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 RLS (restless legs syndrome)   . Respiratory failure   . Injury of conjunctiva and corneal abrasion of right eye without foreign body   . Cellulitis   . Dialysis patient     Tues; Thurs; Sat; [place]  . 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 Anemia   . Pneumonia 2012    "double"  . Blood transfusion 2004    "when I had my transplant"  . Migraine   . 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 Hyperparathyroidism, secondary   . Diabetic retinopathy   . Hypertension     takes Metoprolol and Exforge daily  . Depression     takes Klonopin nightly  . Diabetes mellitus type 1     Pt states diagnosed at age 44 with prior episodes of DKA. S/p pancreatic transplant   . GERD (gastroesophageal reflux disease)     takes Protonix daily  . PONV  (postoperative nausea and vomiting)   . Bronchitis   . Migraine   . Asthma   . Shortness of breath   . Emphysema of lung   . DKA (diabetic ketoacidoses) 07/02/2012  . Angina     none in past 2 years.  . Renal insufficiency    Assessment: 42yof with ESRD s/p failed renal/pancreas transplant on HD TTS presents to the ED with abdominal pain. CT scan showed diffuse colitis. She will begin cipro and flagyl. Pharmacy consulted to dose cipro. Patient receives HD on Tues, Thurs, Sat. Sigmoidoscopy planned for tomorrow (10/24).  Cipro 10/20 >> Flagyl 10/20 >>  WBC 4.9, afebrile   Goal of Therapy:  Appropriate cipro dosing  Plan:  1) Continue Cipro 400mg  IV q24 (appropriate dose for HD) 2) F/u HD plans, sigmoidoscopy results, clinical progression  Charrise Lardner C. Densil Ottey, PharmD Clinical Pharmacist-Resident Pager: [phone] 03/19/2013 11:23 AM

## 2013-03-19 NOTE — Progress Notes (Addendum)
  VASCULAR AND VEIN SURGERY PROGRESS NOTE  POST-OP HEMODIALYSIS ACCESS  Date of Surgery: 03/14/12 Placement of  right thigh AV graft Surgeon: CSD    HPI: [name] is a 42 y.o. female who is 1 year post creation/revision of right lower extremity Hemodialysis access. The patient denies symptoms of numbness, tingling, weakness; c/o pain in medial aspect of thigh graft for 2 weeks after this area was infiltrated at HD center. She has also noted small pseudoaneurysm lateral loop of graft which she states has bled and gets bigger after HD. She was to see Dr. Dickson yesterday but she is here in the Hosp.  Significant Diagnostic Studies: CBC Lab Results  Component Value Date   WBC 4.9 03/19/2013   HGB 10.1* 03/19/2013   HCT 31.5* 03/19/2013   MCV 105.4* 03/19/2013   PLT 156 03/19/2013    BMET    Component Value Date/Time   NA 138 03/19/2013 0753   K 3.9 03/19/2013 0753   CL 100 03/19/2013 0753   CO2 23 03/19/2013 0753   GLUCOSE 47* 03/19/2013 0753   BUN 37* 03/19/2013 0753   CREATININE 8.31* 03/19/2013 0753   CALCIUM 7.5* 03/19/2013 0753   CALCIUM 8.4 03/06/2011 0751   GFRNONAA 5* 03/19/2013 0753   GFRAA 6* 03/19/2013 0753    COAG Lab Results  Component Value Date   INR 1.06 01/27/2010   INR 1.0 12/26/2006   No results found for this basename: PTT    Vital Signs  BP Readings from Last 3 Encounters:  03/19/13 147/85  03/02/13 156/71  03/02/13 156/71   Temp Readings from Last 3 Encounters:  03/19/13 98.1 F (36.7 C) Oral  03/02/13 97.8 F (36.6 C)   03/02/13 97.8 F (36.6 C)    SpO2 Readings from Last 3 Encounters:  03/19/13 99%  03/02/13 96%  03/02/13 96%   Pulse Readings from Last 3 Encounters:  03/19/13 88  03/02/13 66  03/02/13 66     Physical Examination  right lower Incision is well healed, skin color is normal ,There is a good thrill and good bruit in the right thigh graft. There is a small pseudoaneurysm lateral loop of graft about the  size of a pea. There is no ulcer or scab over this.  Assessment/Plan [name] is a 42 y.o. year old female who presents s/p creation/revision of right lower extremity Hemodialysis access. She has a small pseudoaneurysm lateral graft without scab or ulcer. We will reassess after pt off of HD as she states it is larger off dialysis. Will notify Dr. 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 The graft has a good thrill and has been functioning well. I would recommend repair of the small aneurysm electively as an outpatients once her colitis is resolved. To operate on it now would be associated with some increased risk of infection. Will follow.   Christopher Dickson, MD, FACS Beeper [phone] 03/19/2013  

## 2013-03-19 NOTE — Progress Notes (Signed)
[facility] Progress Note  Subjective:   Has an aneurysmal area on graft she had appt with [facility] to see yesterday; still with lower abdominal pain; no diarrhea  Objective Filed Vitals:   03/19/13 0800 03/19/13 0830 03/19/13 0900 03/19/13 0930  BP: 143/77 141/75 126/72 111/65  Pulse: 87  87 84  Temp:      TempSrc:      Resp: 12 12 12 13   Height:      Weight:      SpO2:       Physical Exam Wt 72 kg - goal 4 L General: NAD supine on HD Heart: RRR Lungs: no wheezes or rales Abdomen: soft + BS diffuse lower abdominal tenderness Extremities: no LE edema Dialysis Access: right thigh graft - Qb 400 - 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 Abd pain / "colitis" by CT- pt is immunosuppressed on cipro and flagyl, unclear cause, GI to do sigmoidoscopy Friday.  Mycophenalate has been described to cause "colitis", will try to find a paper on this 2  DM with failed pancreas tx/ hyperglycemia resolved 3. ESRD TTS HD; labs pending; small aneurysmal area on graft will ask [facility] to see 4. MBD- cont vit D; labs pending - resume phoslo if P ^-had been low recently 5. Anemia- on aranesp 60; Hgb stable 10 6. Hyperkalemia / hyponatremia- improved w insulin Rx  7. Hx failed KP transplant- is on low-dose immunosuppression per [facility] doctors w prograf, prednisone and cellcept 8. Nutrition - renal diet - add multivit 10/ HTN/ volume - normally takes exforge and metoprolol - off both at present  Sheffield Slider, PA-C [facility] Beeper [phone] 03/19/2013,9:45 AM  LOS: 3 days   Patient seen and examined.  I agree with assessment and plan as above with additions as indicated. Vinson Moselle  MD Pager [phone]    Cell  [phone] 03/19/2013, 10:22 AM   Additional Objective Labs: Basic Metabolic Panel:  Recent Labs Lab 03/16/13 2220 03/17/13 0400 03/17/13 2300  NA 129* 128* 136  K 4.4 4.1 3.3*  CL 90* 89* 95*  CO2 17* 21 21  GLUCOSE 209* 113* 199*  BUN 57* 56* 25*  CREATININE 8.75* 9.00* 5.55*  CALCIUM 8.0* 7.9* 7.9*   Liver Function Tests:  Recent Labs Lab 03/16/13 1145  AST 44*  ALT 27  ALKPHOS 156*  BILITOT 0.8  PROT 6.7  ALBUMIN 3.3*    Recent Labs Lab 03/16/13 1145  LIPASE 46   CBC:  Recent Labs Lab 03/16/13 1145 03/17/13 0400 03/17/13 2300 03/19/13 0753  WBC 5.5 6.9 5.4 4.9  NEUTROABS 4.6  --   --   --   HGB 10.2* 10.0* 9.9* 10.1*  HCT 34.3* 30.4* 30.4* 31.5*  MCV 112.5* 101.7* 103.1* 105.4*  PLT 164 169 139* 156  Cardiac Enzymes:  Recent Labs Lab 03/16/13 1145  TROPONINI <0.30   CBG:  Recent Labs Lab 03/18/13 0618 03/18/13 0758 03/18/13 1147 03/18/13 1658 03/18/13 2212  GLUCAP 98 113* 118* 76 256*   Medications:   . albuterol  2.5 mg Nebulization BID  . ciprofloxacin  400 mg Intravenous Q24H  . clonazePAM  1 mg Oral QHS  .  darbepoetin (ARANESP) injection - DIALYSIS  60 mcg Intravenous Q Tue-HD  . doxercalciferol  1 mcg Intravenous Q T,Th,Sa-HD  . gabapentin  100 mg Oral Daily  . heparin  5,000 Units Dialysis Once in dialysis  . heparin  5,000 Units Subcutaneous Q8H  . insulin aspart  0-5 Units Subcutaneous QHS  . insulin aspart  0-9 Units Subcutaneous TID WC  . insulin aspart  3 Units Subcutaneous TID WC  . insulin glargine  25 Units Subcutaneous QHS  . metronidazole  500 mg Intravenous Q8H  . multivitamin  1 tablet Oral QHS  . mycophenolate  250 mg Oral BID  . predniSONE  5 mg Oral Daily  . sodium chloride  3 mL Intravenous Q12H  . sodium chloride  3 mL Intravenous Q12H

## 2013-03-19 NOTE — Progress Notes (Signed)
[facility] PROGRESS NOTE  [name] OZH:[id number] DOB: 1970/08/24 DOA: 03/16/2013 PCP: [name], MD  Assessment/Plan:  1. Abdominal pain due to colitis  Patient continues to have abdominal pain. She presently denies nausea, vomiting, diarrhea or bloody stools. She has been evaluated by gastroenterology, plan on sigmoidoscopy tomorrow. Otherwise she remains hemodynamically stable, afebrile, on ciprofloxacin and Flagyl.   2. Diabetes mellitus, poorly controlled, having hemoglobin A1c of 10.0.  Her blood sugars are better controlled, will continue 25 units of Lantus with sliding scale coverage.  3. ESRD   Patient with a history of kidney transplant in 2004 . Dr. Arlean Hopping of nephrology consulted, patient undergoing hemodialysis during this hospitalization. She is on CellCept and prednisone therapy,    4. Hypertension Patient having systolic blood pressures fluctuate between 120s and 140s    5. Anemia secondary to chronic disease  Hemoglobin is stable at 10.1.     Code Status: Full code Family Communication: Plan discussed with patient at bedside and family members present at bedside Disposition Plan: Plan for sigmoidoscopy tomorrow   Consultants:  Nephrology  Gastroenterology   Antibiotics:  Ciprofloxacin  Flagyl  HPI/Subjective: Patient complains of ongoing abdominal pain located in the epigastric region. She has not had diarrhea, bloody stools, hematemesis, nausea or vomiting. She remains afebrile.  Objective: Filed Vitals:   03/19/13 1229  BP: 161/78  Pulse:   Temp: 98.2 F (36.8 C)  Resp: 19    Intake/Output Summary (Last 24 hours) at 03/19/13 1422 Last data filed at 03/19/13 1139  Gross per 24 hour  Intake    410 ml  Output   3503 ml  Net  -3093 ml   Filed Weights   03/17/13 1414 03/19/13 0714 03/19/13 1139  Weight: 67.132 kg (148 lb) 72 kg (158 lb 11.7 oz) 68.7 kg (151 lb 7.3 oz)    Exam:   General:  Patient sitting  at edge of bed, she does not appear to be in significant distress..  Cardiovascular: Regular rate rhythm normal S1-S2  Respiratory: Lungs clear to auscultation bilaterally no wheezing rhonchi oral  Abdomen: Ongoing pain to palpation over epigastric region, no guarding  Musculoskeletal: Present range of motion to all extremities   Data Reviewed: Basic Metabolic Panel:  Recent Labs Lab 03/16/13 1843 03/16/13 2220 03/17/13 0400 03/17/13 2300 03/19/13 0753  NA 122* 129* 128* 136 138  K 6.2* 4.4 4.1 3.3* 3.9  CL 85* 90* 89* 95* 100  CO2 14* 17* 21 21 23   GLUCOSE 894* 209* 113* 199* 47*  BUN 59* 57* 56* 25* 37*  CREATININE 8.52* 8.75* 9.00* 5.55* 8.31*  CALCIUM 7.6* 8.0* 7.9* 7.9* 7.5*  PHOS  --   --   --   --  3.9   Liver Function Tests:  Recent Labs Lab 03/16/13 1145 03/19/13 0753  AST 44*  --   ALT 27  --   ALKPHOS 156*  --   BILITOT 0.8  --   PROT 6.7  --   ALBUMIN 3.3* 3.1*    Recent Labs Lab 03/16/13 1145  LIPASE 46   No results found for this basename: AMMONIA,  in the last 168 hours CBC:  Recent Labs Lab 03/16/13 1145 03/17/13 0400 03/17/13 2300 03/19/13 0753  WBC 5.5 6.9 5.4 4.9  NEUTROABS 4.6  --   --   --   HGB 10.2* 10.0* 9.9* 10.1*  HCT 34.3* 30.4* 30.4* 31.5*  MCV 112.5* 101.7* 103.1* 105.4*  PLT 164 169 139* 156  Cardiac Enzymes:  Recent Labs Lab 03/16/13 1145  TROPONINI <0.30   BNP (last 3 results) No results found for this basename: PROBNP,  in the last 8760 hours CBG:  Recent Labs Lab 03/18/13 1147 03/18/13 1658 03/18/13 2212 03/19/13 1229 03/19/13 1330  GLUCAP 118* 76 256* 63* 75    Recent Results (from the past 240 hour(s))  MRSA PCR SCREENING     Status: None   Collection Time    03/16/13  9:47 PM      Result Value Range Status   MRSA by PCR NEGATIVE  NEGATIVE Final   Comment:            The GeneXpert MRSA Assay (FDA     approved for NASAL specimens     only), is one component of a     comprehensive MRSA  colonization     surveillance program. It is not     intended to diagnose MRSA     infection nor to guide or     monitor treatment for     MRSA infections.     Studies: No results found.  Scheduled Meds: . albuterol  2.5 mg Nebulization BID  . ciprofloxacin  400 mg Intravenous Q24H  . clonazePAM  1 mg Oral QHS  . darbepoetin (ARANESP) injection - DIALYSIS  60 mcg Intravenous Q Tue-HD  . doxercalciferol  1 mcg Intravenous Q T,Th,Sa-HD  . gabapentin  100 mg Oral Daily  . heparin  5,000 Units Dialysis Once in dialysis  . heparin  5,000 Units Subcutaneous Q8H  . insulin aspart  0-5 Units Subcutaneous QHS  . insulin aspart  0-9 Units Subcutaneous TID WC  . insulin aspart  3 Units Subcutaneous TID WC  . insulin glargine  25 Units Subcutaneous QHS  . metronidazole  500 mg Intravenous Q8H  . multivitamin  1 tablet Oral QHS  . mycophenolate  250 mg Oral BID  . predniSONE  5 mg Oral Daily  . sodium chloride  3 mL Intravenous Q12H  . sodium chloride  3 mL Intravenous Q12H   Continuous Infusions:   Active Problems:   ESRD (end stage renal disease)   Abdominal pain   Obesity   Anemia of chronic renal failure   Hyperosmolar hyperglycemia    Time spent: 35 minutes    [name]  [facility] Pager [phone]. If 7PM-7AM, please contact night-coverage at [email or web address], password [facility] 03/19/2013, 2:22 PM  LOS: 3 days

## 2013-03-20 ENCOUNTER — Encounter (HOSPITAL_COMMUNITY)
Admission: EM | Disposition: A | Discharge status: Home / Self Care | Payer: Self-pay | Source: Home / Self Care | Attending: Internal Medicine

## 2013-03-20 DIAGNOSIS — D899 Disorder involving the immune mechanism, unspecified: Secondary | ICD-10-CM

## 2013-03-20 LAB — GLUCOSE, CAPILLARY
Glucose-Capillary: 44 mg/dL — CL (ref 70–99)
Glucose-Capillary: 69 mg/dL — ABNORMAL LOW (ref 70–99)
Glucose-Capillary: 78 mg/dL (ref 70–99)

## 2013-03-20 IMAGING — NM NM PULMONARY VENT & PERF
2 series · 12 of 12 positions shown · non-contrast
Comparison: none

CLINICAL DATA: Chest pain, shortness of breath

VENTILATION PERFUSION SCINTIGRAPHY

[vq scan · 2.52mm/px · 6 of 20 frames shown (1 of 2)]
[frame 2/20  full-range]
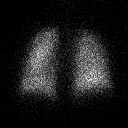
[frame 5/20  full-range]
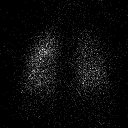
[frame 9/20  full-range]
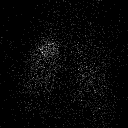
[frame 12/20]
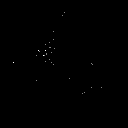
[frame 15/20]
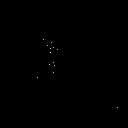
[frame 19/20]
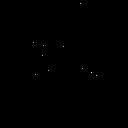

[vq scan · 2.52mm/px · 6 of 20 frames shown (2 of 2)]
[frame 2/20  full-range]
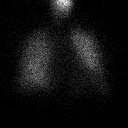
[frame 5/20  full-range]
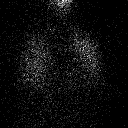
[frame 9/20  full-range]
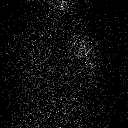
[frame 12/20]
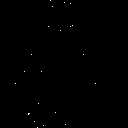
[frame 15/20]
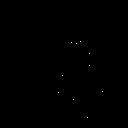
[frame 19/20]
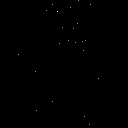

[12 of 12 positions shown; findings below may reference images not displayed]

FINDINGS: Companion chest radiograph shows mild cardiomegaly and
progressive interstitial edema without effusion.  Ventilation
imaging with U2.2mXi 5e-UBB inhaled shows homogeneous distribution
throughout both lungs with no significant retention on the washout
images. Perfusion imaging with 3.Jm2i EcAAm MAA IV shows a somewhat
heterogeneous distribution throughout the lungs with no discrete
segmental or subsegmental perfusion defects. Variable sharply
defined defects over the apices presumably secondary to external
hardware.

IMPRESSION
1. Very low likelihood ratio for pulmonary embolism.

## 2013-03-20 SURGERY — SIGMOIDOSCOPY, FLEXIBLE
Anesthesia: Moderate Sedation | Laterality: Left

## 2013-03-20 MED ORDER — FLEET ENEMA 7-19 GM/118ML RE ENEM: 1.0000 | ENEMA | Freq: Once | RECTAL | Status: DC

## 2013-03-20 MED ORDER — HYDROCODONE-ACETAMINOPHEN 5-325 MG PO TABS: 1.0000 | ORAL_TABLET | Freq: Four times a day (QID) | ORAL | Status: DC | PRN

## 2013-03-20 MED ORDER — AMLODIPINE BESYLATE 5 MG PO TABS
5.0000 mg | ORAL_TABLET | Freq: Every day | ORAL | Status: DC
  Administered 2013-03-20: 5 mg via ORAL
  Filled 2013-03-20: qty 1

## 2013-03-20 MED ORDER — BISACODYL 10 MG RE SUPP: 10.0000 mg | Freq: Once | RECTAL | Status: DC

## 2013-03-20 NOTE — Progress Notes (Signed)
On call [facility] paged regarding [name]'s refusal of scheduled/ordered enema prior to colonoscopy this morning.  Dulcolax suppository was ordered, which [name] also refused.  Physician has canceled procedure at this time.

## 2013-03-20 NOTE — Progress Notes (Signed)
DC home with family, verbally understood DC instructions, no questions  asked 

## 2013-03-20 NOTE — Progress Notes (Signed)
[facility] Progress Note  Subjective:   Patient apparently "refused" bowel prep enema, but she says she was awoken in the middle of the night and doesn't remember refusing  Objective Filed Vitals:   03/19/13 2040 03/19/13 2220 03/20/13 0821 03/20/13 1348  BP:  171/95  167/85  Pulse:  103  100  Temp:  98.3 F (36.8 C)  98 F (36.7 C)  TempSrc:  Oral  Oral  Resp:  20  20  Height:      Weight:      SpO2: 98% 98% 98% 98%   Physical Exam Wt 72 kg - goal 4 L General: NAD supine on HD Heart: RRR Lungs: no wheezes or rales Abdomen: soft + BS diffuse lower abdominal tenderness Extremities: no LE edema Dialysis Access: right thigh graft - Qb 400 - 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 Abd pain / "colitis" by CT- pt is immunosuppressed. No diarrhea. Agree with stopping abx.  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 Patient is being discharged.  I spoke with Dr Hulen Shouts nurse at [facility] who said she will call patient about setting up the sigmoidoscopy procedure to be done as outpatient   2. ESRD TTS HD  3. 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 I spoke with Dr Mortimer Fries today, one of the transplant surgeons from [facility] who felt that she should see them again shortly to address this issue,  but that most likely she is on continued immunosuppression for residual pancreas function, in that she is only requiring 10units of Lantus per day.  Will let HD unit know to facilitate setting up an appt with them  [name] Moselle  MD Pager [phone]    Cell  [phone] 03/20/2013, 3:34 PM   Additional Objective Labs: Basic Metabolic Panel:  Recent  Labs Lab 03/17/13 0400 03/17/13 2300 03/19/13 0753  NA 128* 136 138  K 4.1 3.3* 3.9  CL 89* 95* 100  CO2 21 21 23   GLUCOSE 113* 199* 47*  BUN 56* 25* 37*  CREATININE 9.00* 5.55* 8.31*  CALCIUM 7.9* 7.9* 7.5*  PHOS  --   --  3.9   Liver Function Tests:  Recent Labs Lab 03/16/13 1145 03/19/13 0753  AST 44*  --   ALT 27  --   ALKPHOS 156*  --   BILITOT 0.8  --   PROT 6.7  --   ALBUMIN 3.3* 3.1*    Recent Labs Lab 03/16/13 1145  LIPASE 46   CBC:  Recent Labs Lab 03/16/13 1145 03/17/13 0400 03/17/13 2300 03/19/13 0753  WBC 5.5 6.9 5.4 4.9  NEUTROABS 4.6  --   --   --   HGB 10.2* 10.0* 9.9* 10.1*  HCT 34.3* 30.4* 30.4* 31.5*  MCV 112.5* 101.7* 103.1* 105.4*  PLT 164 169 139* 156  Cardiac Enzymes:  Recent Labs Lab 03/16/13 1145  TROPONINI <0.30   CBG:  Recent Labs Lab 03/19/13 2223 03/20/13 0835 03/20/13 0905 03/20/13 0945 03/20/13 1218  GLUCAP 268* 44* 69* 106* 78   Medications: . sodium chloride Stopped (03/19/13 1553)   . albuterol  2.5 mg Nebulization BID  .  amLODipine  5 mg Oral Daily  . bisacodyl  10 mg Rectal Once  . ciprofloxacin  400 mg Intravenous Q24H  . clonazePAM  1 mg Oral QHS  . darbepoetin (ARANESP) injection - DIALYSIS  60 mcg Intravenous Q Tue-HD  . doxercalciferol  1 mcg Intravenous Q T,Th,Sa-HD  . gabapentin  100 mg Oral Daily  . heparin  5,000 Units Subcutaneous Q8H  . insulin aspart  0-5 Units Subcutaneous QHS  . insulin aspart  0-9 Units Subcutaneous TID WC  . insulin aspart  3 Units Subcutaneous TID WC  . insulin glargine  25 Units Subcutaneous QHS  . metronidazole  500 mg Intravenous Q8H  . multivitamin  1 tablet Oral QHS  . mycophenolate  250 mg Oral BID  . predniSONE  5 mg Oral Daily  . sodium chloride  3 mL Intravenous Q12H  . sodium chloride  3 mL Intravenous Q12H

## 2013-03-20 NOTE — Progress Notes (Signed)
Plan for sigmoidoscopy today.  Patient refusing enemas, so procedure cancelled.  We will sign-off; please call back if she changes her mind about doing enema prep (which is absolutely required before her procedure), and we can revisit doing sigmoidoscopy next week.

## 2013-03-20 NOTE — Progress Notes (Signed)
I spoke with endo. They will not do sigmoid today and will check on her tomorrow.

## 2013-03-20 NOTE — Discharge Summary (Signed)
Physician Discharge Summary  [name]:[id number] DOB: 1970/10/01 DOA: 03/16/2013  PCP: [name], MD  Admit date: 03/16/2013 Discharge date: 03/20/2013  Time spent: 40 minutes  Recommendations for Outpatient Follow-up:  1. Patient to undergo sigmoidoscopy as an outpatient. Please followup with her abdominal pain.   Discharge Diagnoses:  Active Problems:   ESRD (end stage renal disease)   Abdominal pain   Obesity   Anemia of chronic renal failure   Hyperosmolar hyperglycemia   Discharge Condition: Stable  Diet recommendation: Renal diet  Filed Weights   03/17/13 1414 03/19/13 0714 03/19/13 1139  Weight: 67.132 kg (148 lb) 72 kg (158 lb 11.7 oz) 68.7 kg (151 lb 7.3 oz)    History of present illness:  [name] is a 42 y.o. female  Who presents from home. She got dialysis Saturday and was feeling fine. SHe then woke up Sunday with Sharp abd pain. This is different than her normal pain. She also says she has SOB with cough x 1 week. She also says she vomited this AM. She contributed her abd pain to her menstrual cycle ending and took "something" for it but it did not improve. Had a BM today-no diarrhea  In the Er, a V/Q scan was done and was low prob. A CT scan is pending. Labs showed a glucose of > 1000. Patient insists that she is taking her insulin. SHe was seen by nephro and scheduled for dialysis tomm AM.   Hospital Course:  Patient is a pleasant 42 year old female with a past medical history of kidney/pancreas transplant, presently on chronic immunosuppressive therapy who was admitted to the medicine service on 03/16/2013. She presented with complaints of abdominal pain located in the epigastric and periumbilical region,  Worse from her baseline. She has a history of chronic abdominal pain, undergoing CT scan of abdomen and pelvis on 06/10/2012 which showed diffuse colonic wall thickening and pericolic infiltration suggestive of pancolitis. A  repeat CT scan on this admission again revealed diffuse colon wall thickening with mild infiltration of the omentum. Findings suggesting diffuse colitis. She was started on empiric antibiotic therapy with ciprofloxacin and Flagyl. She did not have bloody stools, diarrhea, or nausea vomiting during this hospitalization. Given her history of chronic immunosuppression, gastroenterology was consulted as he recommended patient undergo sigmoidoscopy. This had been planned for the morning of 03/20/2013. Unfortunately patient refused to undergo prep the evening prior. She later changed her mind however sigmoidoscopy cannot be performed today. Given clinical stability, patient remained afebrile, lab work showing normal white count, tolerating by mouth intake, she was discharged from my service with instructions to followup with gastroenterology early next week to set up sigmoidoscopy. With regard to her renal disease, Dr. Arlean Hopping of nephrology was involved in her care and she underwent hemodialysis during this hospitalization. Dr. Durwin Nora of vascular surgery informed of patient's discharge. Will likely repair or small aneurysm electively as an outpatient. Patient was discharged in stable condition on 03/20/2013.  Consultations:  Gastroenterology  Nephrology  Vascular surgery  Discharge Exam: Filed Vitals:   03/20/13 1348  BP: 167/85  Pulse: 100  Temp: 98 F (36.7 C)  Resp: 20    General: Patient in no acute distress, sitting at edge of bed, has been tolerating by mouth intake Cardiovascular: Regular rate and rhythm normal S1-S2, no extremity Respiratory: Lungs are clear to auscultation bilaterally no wheezing rhonchi or rales Abdomen: Patient having mild tenderness to palpation over epigastric and periumbilical regions Extremities: No edema  Discharge Instructions  Discharge Orders   Future Orders Complete By Expires   Call MD for:  extreme fatigue  As directed    Call MD for:  persistant nausea  and vomiting  As directed    Call MD for:  severe uncontrolled pain  As directed    Call MD for:  temperature >100.4  As directed    Diet - low sodium heart healthy  As directed    Discharge instructions  As directed    Comments:     Please follow up with Dr Durwin Nora of vascular Surgery early next week Please follow up with Dr Dulce Sellar of GI early next week.   Increase activity slowly  As directed        Medication List    STOP taking these medications       NOVOLOG FLEXPEN [place]     pantoprazole 40 MG tablet  Commonly known as:  PROTONIX     zolpidem 12.5 MG CR tablet  Commonly known as:  AMBIEN CR      TAKE these medications       albuterol 108 (90 BASE) MCG/ACT inhaler  Commonly known as:  PROVENTIL HFA;VENTOLIN HFA  Inhale 2 puffs into the lungs every 4 (four) hours as needed for wheezing or shortness of breath (as per home regimen).     aspirin EC 81 MG tablet  Take 81 mg by mouth daily.     calcium acetate 667 MG capsule  Commonly known as:  PHOSLO  Take 2,001-2,668 mg by mouth 3 (three) times daily with meals. Dosage depends on meal consumed     clonazePAM 0.5 MG tablet  Commonly known as:  KLONOPIN  Take 1 mg by mouth at bedtime.     diphenhydrAMINE 50 MG tablet  Commonly known as:  BENADRYL  Take 50 mg by mouth at bedtime as needed for allergies.     EXFORGE 10-160 MG per tablet  Generic drug:  amLODipine-valsartan  Take 1 tablet by mouth at bedtime.     gabapentin 100 MG capsule  Commonly known as:  NEURONTIN  Take 100 mg by mouth daily.     HYDROcodone-acetaminophen 5-325 MG per tablet  Commonly known as:  NORCO  Take 1 tablet by mouth every 6 (six) hours as needed for pain.     insulin glargine 100 units/mL Soln  Commonly known as:  LANTUS  Inject 5 Units into the skin 2 (two) times daily.     metoprolol succinate 100 MG 24 hr tablet  Commonly known as:  TOPROL-XL  Take 100 mg by mouth at bedtime. Take with or immediately following a meal.      multivitamin Tabs tablet  Take 1 tablet by mouth daily.     mycophenolate 250 MG capsule  Commonly known as:  CELLCEPT  Take 250 mg by mouth 2 (two) times daily.     predniSONE 5 MG tablet  Commonly known as:  DELTASONE  Take 5 mg by mouth daily.     tacrolimus 1 MG capsule  Commonly known as:  PROGRAF  Take 1 mg by mouth 2 (two) times daily.       Allergies  Allergen Reactions  . Depakote [Divalproex Sodium] Other (See Comments)    Pt gets "delirious"  . Morphine And Related Nausea And Vomiting and Other (See Comments)    "makes me delirious"  . Penicillins Anaphylaxis  . Tramadol Nausea And Vomiting  . Vicodin [Hydrocodone-Acetaminophen] Itching and Rash  Follow-up Information   Follow up with Freddy Jaksch, MD In 5 days.   Specialty:  Gastroenterology   Contact information:   [address] [place] [address] [phone]       Follow up with [facility], [name], MD In 1 week.   Specialty:  Family Medicine   Contact information:   [address] [place] [address] [phone]        The results of significant diagnostics from this hospitalization (including imaging, microbiology, ancillary and laboratory) are listed below for reference.    Significant Diagnostic Studies: Dg Chest 2 View  03/16/2013   CLINICAL DATA:  Shortness of breath  EXAM: CHEST  2 VIEW  COMPARISON:  02/19/2013  FINDINGS: The cardio pericardial silhouette is enlarged. There is right base atelectasis or infiltrate. Small right pleural effusion associated. Imaged bony structures of the thorax are intact.  IMPRESSION: Right base atelectasis or pneumonia with small right pleural effusion.   Electronically Signed   By: Kennith Center M.D.   On: 03/16/2013 17:34   Ct Abdomen Pelvis W Contrast  03/16/2013   CLINICAL DATA:  Diffuse stabbing abdominal pain with nausea and vomiting for 2 days, chronic dialysis  EXAM: CT ABDOMEN AND PELVIS WITH CONTRAST   TECHNIQUE: Multidetector CT imaging of the abdomen and pelvis was performed using the standard protocol following bolus administration of intravenous contrast.  CONTRAST:  OMNIPAQUE IOHEXOL 300 MG/ML  SOLN  COMPARISON:  06/10/2012  FINDINGS: There is mild scattered bilateral hazy ground-glass attenuation with more focal dependent atelectasis bilaterally. There is a tiny left and small to moderate right pleural effusion.  There are no focal hepatic abnormalities. There is mild periportal edema. The spleen, pancreas, and adrenal glands are normal. There is severe bilateral renal atrophy and cystic change. 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 There is a nonobstructive bowel gas pattern with mild diffuse colon wall thickening. There is also mild infiltration of the omentum and inflammatory change in the adjacent fat. These findings are similar to the prior study. The appendix is normal. Reproductive organs and bladder are normal. There is a small volume of ascites. Small transplanted kidney left flank unchanged. There are no acute musculoskeletal findings.  IMPRESSION: Diffuse colon wall thickening with mild infiltration of the omentum. The findings again suggest a diffuse colitis.   Electronically Signed   By: Esperanza Heir M.D.   On: 03/16/2013 17:45   Nm Pulmonary Perf And Vent  03/16/2013   CLINICAL DATA:  Shortness of breath  EXAM: NUCLEAR MEDICINE VENTILATION - PERFUSION LUNG SCAN  TECHNIQUE: Ventilation images were obtained in multiple projections using inhaled aerosol technetium 99 M DTPA. Perfusion images were obtained in multiple projections after intravenous injection of Tc-61m MAA.  COMPARISON:  02/27/2011.  RADIOPHARMACEUTICALS:  40 mCi Tc-46m DTPA aerosol and 6 mCi Tc-11m MAA  FINDINGS: Ventilation: No focal ventilation defect.  Perfusion: No wedge shaped peripheral perfusion defects to suggest acute pulmonary embolism.  IMPRESSION: Very low probability  for pulmonary embolus. .   Electronically Signed   By: Kennith Center M.D.   On: 03/16/2013 17:21   Dg Chest Port 1 View  02/19/2013   CLINICAL DATA:  Congestion. A infiltrates.  EXAM: PORTABLE CHEST - 1 VIEW  COMPARISON:  PA and lateral chest 02/17/2013 and 12/2012.  FINDINGS: There is cardiomegaly and mild interstitial edema. No pneumothorax or pleural fluid.  IMPRESSION: Cardiomegaly and mild interstitial edema.   Electronically Signed  By: Drusilla Kanner M.D.   On: 02/19/2013 05:58    Microbiology: Recent Results (from the past 240 hour(s))  MRSA PCR SCREENING     Status: None   Collection Time    03/16/13  9:47 PM      Result Value Range Status   MRSA by PCR NEGATIVE  NEGATIVE Final   Comment:            The GeneXpert MRSA Assay (FDA     approved for NASAL specimens     only), is one component of a     comprehensive MRSA colonization     surveillance program. It is not     intended to diagnose MRSA     infection nor to guide or     monitor treatment for     MRSA infections.     Labs: Basic Metabolic Panel:  Recent Labs Lab 03/16/13 1843 03/16/13 2220 03/17/13 0400 03/17/13 2300 03/19/13 0753  NA 122* 129* 128* 136 138  K 6.2* 4.4 4.1 3.3* 3.9  CL 85* 90* 89* 95* 100  CO2 14* 17* 21 21 23   GLUCOSE 894* 209* 113* 199* 47*  BUN 59* 57* 56* 25* 37*  CREATININE 8.52* 8.75* 9.00* 5.55* 8.31*  CALCIUM 7.6* 8.0* 7.9* 7.9* 7.5*  PHOS  --   --   --   --  3.9   Liver Function Tests:  Recent Labs Lab 03/16/13 1145 03/19/13 0753  AST 44*  --   ALT 27  --   ALKPHOS 156*  --   BILITOT 0.8  --   PROT 6.7  --   ALBUMIN 3.3* 3.1*    Recent Labs Lab 03/16/13 1145  LIPASE 46   No results found for this basename: AMMONIA,  in the last 168 hours CBC:  Recent Labs Lab 03/16/13 1145 03/17/13 0400 03/17/13 2300 03/19/13 0753  WBC 5.5 6.9 5.4 4.9  NEUTROABS 4.6  --   --   --   HGB 10.2* 10.0* 9.9* 10.1*  HCT 34.3* 30.4* 30.4* 31.5*  MCV 112.5* 101.7* 103.1*  105.4*  PLT 164 169 139* 156   Cardiac Enzymes:  Recent Labs Lab 03/16/13 1145  TROPONINI <0.30   BNP: BNP (last 3 results) No results found for this basename: PROBNP,  in the last 8760 hours CBG:  Recent Labs Lab 03/19/13 2223 03/20/13 0835 03/20/13 0905 03/20/13 0945 03/20/13 1218  GLUCAP 268* 44* 69* 106* 78       Signed:  Wei Poplaski  [facility] 03/20/2013, 2:40 PM

## 2013-03-21 ENCOUNTER — Emergency Department (HOSPITAL_COMMUNITY): Payer: Medicare Other

## 2013-03-21 ENCOUNTER — Encounter (HOSPITAL_COMMUNITY): Payer: Self-pay | Admitting: Emergency Medicine

## 2013-03-21 ENCOUNTER — Inpatient Hospital Stay (HOSPITAL_COMMUNITY)
Admission: EM | Admit: 2013-03-21 | Discharge: 2013-03-28 | DRG: 391 | Disposition: A | Discharge status: Home / Self Care | Payer: Medicare Other | Attending: Internal Medicine | Admitting: Internal Medicine

## 2013-03-21 DIAGNOSIS — R109 Unspecified abdominal pain: Secondary | ICD-10-CM | POA: Diagnosis present

## 2013-03-21 DIAGNOSIS — N058 Unspecified nephritic syndrome with other morphologic changes: Secondary | ICD-10-CM

## 2013-03-21 DIAGNOSIS — J438 Other emphysema: Secondary | ICD-10-CM | POA: Diagnosis present

## 2013-03-21 DIAGNOSIS — K219 Gastro-esophageal reflux disease without esophagitis: Secondary | ICD-10-CM | POA: Diagnosis present

## 2013-03-21 DIAGNOSIS — E669 Obesity, unspecified: Secondary | ICD-10-CM

## 2013-03-21 DIAGNOSIS — D631 Anemia in chronic kidney disease: Secondary | ICD-10-CM | POA: Diagnosis present

## 2013-03-21 DIAGNOSIS — N2581 Secondary hyperparathyroidism of renal origin: Secondary | ICD-10-CM | POA: Diagnosis present

## 2013-03-21 DIAGNOSIS — Z79899 Other long term (current) drug therapy: Secondary | ICD-10-CM

## 2013-03-21 DIAGNOSIS — E785 Hyperlipidemia, unspecified: Secondary | ICD-10-CM | POA: Diagnosis present

## 2013-03-21 DIAGNOSIS — K625 Hemorrhage of anus and rectum: Secondary | ICD-10-CM | POA: Diagnosis present

## 2013-03-21 DIAGNOSIS — Z9089 Acquired absence of other organs: Secondary | ICD-10-CM

## 2013-03-21 DIAGNOSIS — Z833 Family history of diabetes mellitus: Secondary | ICD-10-CM

## 2013-03-21 DIAGNOSIS — K5289 Other specified noninfective gastroenteritis and colitis: Principal | ICD-10-CM | POA: Diagnosis present

## 2013-03-21 DIAGNOSIS — Z888 Allergy status to other drugs, medicaments and biological substances status: Secondary | ICD-10-CM

## 2013-03-21 DIAGNOSIS — I120 Hypertensive chronic kidney disease with stage 5 chronic kidney disease or end stage renal disease: Secondary | ICD-10-CM | POA: Diagnosis present

## 2013-03-21 DIAGNOSIS — Z992 Dependence on renal dialysis: Secondary | ICD-10-CM

## 2013-03-21 DIAGNOSIS — Z881 Allergy status to other antibiotic agents status: Secondary | ICD-10-CM

## 2013-03-21 DIAGNOSIS — F329 Major depressive disorder, single episode, unspecified: Secondary | ICD-10-CM | POA: Diagnosis present

## 2013-03-21 DIAGNOSIS — Z940 Kidney transplant status: Secondary | ICD-10-CM | POA: Diagnosis present

## 2013-03-21 DIAGNOSIS — E1029 Type 1 diabetes mellitus with other diabetic kidney complication: Secondary | ICD-10-CM | POA: Diagnosis present

## 2013-03-21 DIAGNOSIS — K648 Other hemorrhoids: Secondary | ICD-10-CM | POA: Diagnosis present

## 2013-03-21 DIAGNOSIS — Z794 Long term (current) use of insulin: Secondary | ICD-10-CM

## 2013-03-21 DIAGNOSIS — K529 Noninfective gastroenteritis and colitis, unspecified: Secondary | ICD-10-CM

## 2013-03-21 DIAGNOSIS — E1039 Type 1 diabetes mellitus with other diabetic ophthalmic complication: Secondary | ICD-10-CM | POA: Diagnosis present

## 2013-03-21 DIAGNOSIS — IMO0002 Reserved for concepts with insufficient information to code with codable children: Secondary | ICD-10-CM

## 2013-03-21 DIAGNOSIS — E1069 Type 1 diabetes mellitus with other specified complication: Secondary | ICD-10-CM | POA: Diagnosis present

## 2013-03-21 DIAGNOSIS — E11319 Type 2 diabetes mellitus with unspecified diabetic retinopathy without macular edema: Secondary | ICD-10-CM | POA: Diagnosis present

## 2013-03-21 DIAGNOSIS — Z841 Family history of disorders of kidney and ureter: Secondary | ICD-10-CM

## 2013-03-21 DIAGNOSIS — G2581 Restless legs syndrome: Secondary | ICD-10-CM | POA: Diagnosis present

## 2013-03-21 DIAGNOSIS — N186 End stage renal disease: Secondary | ICD-10-CM

## 2013-03-21 DIAGNOSIS — Z8249 Family history of ischemic heart disease and other diseases of the circulatory system: Secondary | ICD-10-CM

## 2013-03-21 DIAGNOSIS — Z9483 Pancreas transplant status: Secondary | ICD-10-CM

## 2013-03-21 DIAGNOSIS — F3289 Other specified depressive episodes: Secondary | ICD-10-CM | POA: Diagnosis present

## 2013-03-21 DIAGNOSIS — D899 Disorder involving the immune mechanism, unspecified: Secondary | ICD-10-CM

## 2013-03-21 LAB — COMPREHENSIVE METABOLIC PANEL
ALT: 17 U/L (ref 0–35)
AST: 33 U/L (ref 0–37)
Alkaline Phosphatase: 125 U/L — ABNORMAL HIGH (ref 39–117)
CO2: 20 mEq/L (ref 19–32)
Chloride: 98 mEq/L (ref 96–112)
GFR calc Af Amer: 7 mL/min — ABNORMAL LOW (ref >90)
GFR calc non Af Amer: 6 mL/min — ABNORMAL LOW (ref >90)
Glucose, Bld: 122 mg/dL — ABNORMAL HIGH (ref 70–99)
Potassium: 5.8 mEq/L — ABNORMAL HIGH (ref 3.5–5.1)
Sodium: 133 mEq/L — ABNORMAL LOW (ref 135–145)
Total Bilirubin: 0.8 mg/dL (ref 0.3–1.2)

## 2013-03-21 LAB — POCT I-STAT TROPONIN I: Troponin i, poc: 0.02 ng/mL (ref 0.00–0.08)

## 2013-03-21 LAB — CBC WITH DIFFERENTIAL/PLATELET
Basophils Absolute: 0 10*3/uL (ref 0.0–0.1)
Eosinophils Absolute: 0.1 10*3/uL (ref 0.0–0.7)
Lymphocytes Relative: 13 % (ref 12–46)
MCH: 34 pg (ref 26.0–34.0)
Monocytes Relative: 5 % (ref 3–12)
Neutro Abs: 6.4 10*3/uL (ref 1.7–7.7)
Neutrophils Relative %: 79 % — ABNORMAL HIGH (ref 43–77)
Platelets: 134 10*3/uL — ABNORMAL LOW (ref 150–400)
RDW: 17.2 % — ABNORMAL HIGH (ref 11.5–15.5)
WBC: 8.1 10*3/uL (ref 4.0–10.5)

## 2013-03-21 LAB — GLUCOSE, CAPILLARY: Glucose-Capillary: 90 mg/dL (ref 70–99)

## 2013-03-21 MED ORDER — ACETAMINOPHEN 325 MG PO TABS: 650.0000 mg | ORAL_TABLET | Freq: Four times a day (QID) | ORAL | Status: DC | PRN

## 2013-03-21 MED ORDER — SODIUM CHLORIDE 0.9 % IV SOLN: 100.0000 mL | INTRAVENOUS | Status: DC | PRN

## 2013-03-21 MED ORDER — DIPHENHYDRAMINE HCL 25 MG PO TABS
50.0000 mg | ORAL_TABLET | Freq: Every evening | ORAL | Status: DC | PRN
  Administered 2013-03-27 – 2013-03-28 (×2): 50 mg via ORAL
  Filled 2013-03-21 (×2): qty 2

## 2013-03-21 MED ORDER — NEPRO/CARBSTEADY PO LIQD: 237.0000 mL | ORAL | Status: DC | PRN

## 2013-03-21 MED ORDER — ALTEPLASE 2 MG IJ SOLR
2.0000 mg | Freq: Once | INTRAMUSCULAR | Status: AC | PRN
  Filled 2013-03-21: qty 2

## 2013-03-21 MED ORDER — MYCOPHENOLATE MOFETIL 250 MG PO CAPS
250.0000 mg | ORAL_CAPSULE | Freq: Two times a day (BID) | ORAL | Status: DC
  Administered 2013-03-22 (×2): 250 mg via ORAL
  Filled 2013-03-21 (×3): qty 1

## 2013-03-21 MED ORDER — SODIUM CHLORIDE 0.9 % IJ SOLN: 3.0000 mL | INTRAMUSCULAR | Status: DC | PRN

## 2013-03-21 MED ORDER — ALBUTEROL SULFATE HFA 108 (90 BASE) MCG/ACT IN AERS: 2.0000 | INHALATION_SPRAY | RESPIRATORY_TRACT | Status: DC | PRN

## 2013-03-21 MED ORDER — INSULIN GLARGINE 100 UNIT/ML ~~LOC~~ SOLN
7.0000 [IU] | Freq: Two times a day (BID) | SUBCUTANEOUS | Status: DC
  Administered 2013-03-22 (×3): 7 [IU] via SUBCUTANEOUS
  Filled 2013-03-21 (×5): qty 0.07

## 2013-03-21 MED ORDER — OXYCODONE HCL 5 MG PO TABS
5.0000 mg | ORAL_TABLET | ORAL | Status: DC | PRN
  Administered 2013-03-23 – 2013-03-25 (×2): 5 mg via ORAL
  Filled 2013-03-21 (×3): qty 1

## 2013-03-21 MED ORDER — AMLODIPINE BESYLATE-VALSARTAN 10-160 MG PO TABS: 1.0000 | ORAL_TABLET | Freq: Every day | ORAL | Status: DC

## 2013-03-21 MED ORDER — LIDOCAINE-PRILOCAINE 2.5-2.5 % EX CREA: 1.0000 "application " | TOPICAL_CREAM | CUTANEOUS | Status: DC | PRN

## 2013-03-21 MED ORDER — ONDANSETRON HCL 4 MG PO TABS
4.0000 mg | ORAL_TABLET | Freq: Four times a day (QID) | ORAL | Status: DC | PRN
  Administered 2013-03-24 – 2013-03-27 (×2): 4 mg via ORAL
  Filled 2013-03-21 (×2): qty 1

## 2013-03-21 MED ORDER — TACROLIMUS 1 MG PO CAPS
1.0000 mg | ORAL_CAPSULE | Freq: Two times a day (BID) | ORAL | Status: DC
  Administered 2013-03-22 – 2013-03-28 (×13): 1 mg via ORAL
  Filled 2013-03-21 (×15): qty 1

## 2013-03-21 MED ORDER — INSULIN ASPART 100 UNIT/ML ~~LOC~~ SOLN
0.0000 [IU] | SUBCUTANEOUS | Status: DC
  Administered 2013-03-22 (×2): 1 [IU] via SUBCUTANEOUS
  Administered 2013-03-23: 10 [IU] via SUBCUTANEOUS
  Administered 2013-03-23: 2 [IU] via SUBCUTANEOUS

## 2013-03-21 MED ORDER — PREDNISONE 5 MG PO TABS
5.0000 mg | ORAL_TABLET | Freq: Every day | ORAL | Status: DC
  Administered 2013-03-22 – 2013-03-28 (×6): 5 mg via ORAL
  Filled 2013-03-21 (×9): qty 1

## 2013-03-21 MED ORDER — METOPROLOL SUCCINATE ER 100 MG PO TB24
100.0000 mg | ORAL_TABLET | Freq: Every day | ORAL | Status: DC
  Administered 2013-03-22 – 2013-03-27 (×6): 100 mg via ORAL
  Filled 2013-03-21 (×8): qty 1

## 2013-03-21 MED ORDER — HYDROMORPHONE HCL PF 1 MG/ML IJ SOLN
0.5000 mg | INTRAMUSCULAR | Status: DC | PRN
  Administered 2013-03-21 – 2013-03-28 (×22): 1 mg via INTRAVENOUS
  Filled 2013-03-21 (×23): qty 1

## 2013-03-21 MED ORDER — CALCIUM ACETATE 667 MG PO CAPS
2001.0000 mg | ORAL_CAPSULE | Freq: Three times a day (TID) | ORAL | Status: DC
  Filled 2013-03-21 (×4): qty 4

## 2013-03-21 MED ORDER — SODIUM CHLORIDE 0.9 % IJ SOLN
3.0000 mL | Freq: Two times a day (BID) | INTRAMUSCULAR | Status: DC
  Administered 2013-03-21 – 2013-03-28 (×11): 3 mL via INTRAVENOUS

## 2013-03-21 MED ORDER — DEXTROSE 50 % IV SOLN
INTRAVENOUS | Status: AC
  Administered 2013-03-22: 50 mL
  Filled 2013-03-21: qty 50

## 2013-03-21 MED ORDER — PANTOPRAZOLE SODIUM 40 MG IV SOLR
40.0000 mg | Freq: Every day | INTRAVENOUS | Status: DC
  Administered 2013-03-21 – 2013-03-25 (×5): 40 mg via INTRAVENOUS
  Filled 2013-03-21 (×6): qty 40

## 2013-03-21 MED ORDER — CLONAZEPAM 1 MG PO TABS
1.0000 mg | ORAL_TABLET | Freq: Every day | ORAL | Status: DC
  Administered 2013-03-22 – 2013-03-27 (×7): 1 mg via ORAL
  Filled 2013-03-21 (×7): qty 1

## 2013-03-21 MED ORDER — GABAPENTIN 100 MG PO CAPS
100.0000 mg | ORAL_CAPSULE | Freq: Every day | ORAL | Status: DC
  Administered 2013-03-22 – 2013-03-27 (×7): 100 mg via ORAL
  Filled 2013-03-21 (×8): qty 1

## 2013-03-21 MED ORDER — RENA-VITE PO TABS
1.0000 | ORAL_TABLET | Freq: Every day | ORAL | Status: DC
  Administered 2013-03-22 – 2013-03-27 (×7): 1 via ORAL
  Filled 2013-03-21 (×8): qty 1

## 2013-03-21 MED ORDER — MORPHINE SULFATE 10 MG/ML IJ SOLN
10.0000 mg | Freq: Once | INTRAMUSCULAR | Status: AC
  Administered 2013-03-21: 10 mg via INTRAMUSCULAR
  Filled 2013-03-21: qty 1

## 2013-03-21 MED ORDER — HEPARIN SODIUM (PORCINE) 1000 UNIT/ML DIALYSIS
5000.0000 [IU] | Freq: Once | INTRAMUSCULAR | Status: DC
  Filled 2013-03-21: qty 5

## 2013-03-21 MED ORDER — INSULIN GLARGINE 100 UNITS/ML SOLOSTAR PEN: 7.0000 [IU] | PEN_INJECTOR | Freq: Two times a day (BID) | SUBCUTANEOUS | Status: DC

## 2013-03-21 MED ORDER — ONDANSETRON HCL 4 MG/2ML IJ SOLN
4.0000 mg | Freq: Four times a day (QID) | INTRAMUSCULAR | Status: DC | PRN
  Administered 2013-03-22 – 2013-03-28 (×5): 4 mg via INTRAVENOUS
  Filled 2013-03-21 (×2): qty 2

## 2013-03-21 MED ORDER — ZOLPIDEM TARTRATE 5 MG PO TABS
5.0000 mg | ORAL_TABLET | Freq: Every evening | ORAL | Status: DC | PRN
  Administered 2013-03-23 – 2013-03-27 (×5): 5 mg via ORAL
  Filled 2013-03-21 (×5): qty 1

## 2013-03-21 MED ORDER — LIDOCAINE HCL (PF) 1 % IJ SOLN: 5.0000 mL | INTRAMUSCULAR | Status: DC | PRN

## 2013-03-21 MED ORDER — IRBESARTAN 150 MG PO TABS
150.0000 mg | ORAL_TABLET | Freq: Every day | ORAL | Status: DC
  Administered 2013-03-22 – 2013-03-24 (×4): 150 mg via ORAL
  Filled 2013-03-21 (×5): qty 1

## 2013-03-21 MED ORDER — PENTAFLUOROPROP-TETRAFLUOROETH EX AERO: 1.0000 "application " | INHALATION_SPRAY | CUTANEOUS | Status: DC | PRN

## 2013-03-21 MED ORDER — ALUM & MAG HYDROXIDE-SIMETH 200-200-20 MG/5ML PO SUSP
30.0000 mL | Freq: Four times a day (QID) | ORAL | Status: DC | PRN
  Filled 2013-03-21: qty 30

## 2013-03-21 MED ORDER — SODIUM CHLORIDE 0.9 % IV SOLN: 250.0000 mL | INTRAVENOUS | Status: DC | PRN

## 2013-03-21 MED ORDER — ACETAMINOPHEN 650 MG RE SUPP: 650.0000 mg | Freq: Four times a day (QID) | RECTAL | Status: DC | PRN

## 2013-03-21 MED ORDER — HEPARIN SODIUM (PORCINE) 1000 UNIT/ML DIALYSIS
1000.0000 [IU] | INTRAMUSCULAR | Status: DC | PRN
  Filled 2013-03-21: qty 1

## 2013-03-21 MED ORDER — AMLODIPINE BESYLATE 10 MG PO TABS
10.0000 mg | ORAL_TABLET | Freq: Every day | ORAL | Status: DC
  Administered 2013-03-22 – 2013-03-23 (×3): 10 mg via ORAL
  Filled 2013-03-21 (×5): qty 1

## 2013-03-21 NOTE — ED Provider Notes (Signed)
CSN: [id number]     Arrival date & time 03/21/13  1641 History   First MD Initiated Contact with Patient 03/21/13 1655     Chief Complaint  Patient presents with  . Abdominal Pain    HPI  The patient is discharged from the hospital yesterday. History of end-stage renal disease on 3 times weekly maintenance hemodialysis. Has a history of a failed renal and pancreatic transplant. Still has some pancreatic function thus remains on immunosuppressants including CellCept. Headache colitis per CT while she was hospitalized. This was thought to be secondary to her CellCept (unknown side effect). She was taken off of antibiotics. She had no bleeding. She was discharged yesterday. She was given a prescription for pain medication. She has not filled it. She went to dialysis today per her report. She states she was told by dialysis nurse that she should come the hospital because she reported to her that she had rectal bleeding. She went back home. For no known reason she waited 4 hours and presents to the emergency room. She states she's had 5 bright red bloody stools for the course of the day today and still has abdominal pain. She is nauseated and vomited once.  Was not dialyzed today  Past Medical History  Diagnosis Date  . Hyperlipidemia   . Alopecia   . Obesity   . Iron deficiency   . 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 RLS (restless legs syndrome)   . Respiratory failure   . Injury of conjunctiva and corneal abrasion of right eye without foreign body   . Cellulitis   . Dialysis patient     Tues; Thurs; Sat; [place]  . 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 Anemia   . Pneumonia 2012    "double"  . Blood transfusion 2004    "when I had my transplant"  . Migraine   . 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 Hyperparathyroidism, secondary   . Diabetic  retinopathy   . Hypertension     takes Metoprolol and Exforge daily  . Depression     takes Klonopin nightly  . Diabetes mellitus type 1     Pt states diagnosed at age 75 with prior episodes of DKA. S/p pancreatic transplant   . GERD (gastroesophageal reflux disease)     takes Protonix daily  . PONV (postoperative nausea and vomiting)   . Bronchitis   . Migraine   . Asthma   . Shortness of breath   . Emphysema of lung   . DKA (diabetic ketoacidoses) 07/02/2012  . Angina     none in past 2 years.  . Renal insufficiency    Past Surgical History  Procedure Laterality Date  . 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 Thyroglobulin      x 7  . Av fistula placement      right upper arm  . Cholecystectomy  1995  .  hd graft placement/removal      "had 2 in my left upper arm"  . Eye surgery    . Retinopathy surgery    . Tooth extraction  10/10/11    X's two  . Insertion of dialysis catheter  12/08/2011    Procedure: INSERTION OF DIALYSIS CATHETER;  Surgeon: Chuck Hint, MD;  Location: [facility] OR;  Service: Vascular;  Laterality: Left;  . Av fistula placement  01/22/2012    Procedure: INSERTION OF ARTERIOVENOUS (AV) GORE-TEX GRAFT ARM;  Surgeon: Chuck Hint, MD;  Location[facility] OR;  Service: Vascular;  Laterality: Left;  Ultrasound guided.  . Av fistula placement  03/14/2012    Procedure: INSERTION OF ARTERIOVENOUS (AV) GORE-TEX GRAFT THIGH;  Surgeon: Chuck Hint, MD;  Location: [facility] OR;  Service: Vascular;  Laterality: Right;  . False aneurysm repair Right 01/02/2013    Procedure: Excision of lymphocele in right thigh.;  Surgeon: Chuck Hint, MD;  Location: [facility] OR;  Service: Vascular;  Laterality: Right;  . Carpal tunnel release Right 03/02/2013    Procedure: CARPAL TUNNEL RELEASE;  Surgeon: Tami Ribas, MD;  Location: [facility];  Service: Orthopedics;  Laterality: Right;   Family History  Problem  Relation Age of Onset  . Hypertension Mother   . Kidney disease Mother   . Diabetes Mother   . Hypertension Father   . Kidney disease Father   . Diabetes Father    History  Substance Use Topics  . Smoking status: Never Smoker   . Smokeless tobacco: Never Used  . Alcohol Use: No   OB History   Grav Para Term Preterm Abortions TAB SAB Ect Mult Living                 Review of Systems  Constitutional: Negative for fever, chills, diaphoresis, appetite change and fatigue.  HENT: Negative for mouth sores, sore throat and trouble swallowing.   Eyes: Negative for visual disturbance.  Respiratory: Negative for cough, chest tightness, shortness of breath and wheezing.   Cardiovascular: Negative for chest pain.  Gastrointestinal: Positive for nausea, vomiting, abdominal pain, diarrhea and blood in stool. Negative for abdominal distention.  Endocrine: Negative for polydipsia, polyphagia and polyuria.  Genitourinary: Negative for dysuria, frequency and hematuria.  Musculoskeletal: Negative for gait problem.  Skin: Negative for color change, pallor and rash.  Neurological: Negative for dizziness, syncope, light-headedness and headaches.  Hematological: Does not bruise/bleed easily.  Psychiatric/Behavioral: Negative for behavioral problems and confusion.    Allergies  Depakote; Morphine and related; Penicillins; Tramadol; and Vicodin  Home Medications   Current Outpatient Rx  Name  Route  Sig  Dispense  Refill  . albuterol (PROVENTIL HFA;VENTOLIN HFA) 108 (90 BASE) MCG/ACT inhaler   Inhalation   Inhale 2 puffs into the lungs every 4 (four) hours as needed for wheezing or shortness of breath (as per home regimen).   1 Inhaler   0   . aspirin EC 81 MG tablet   Oral   Take 81 mg by mouth daily.         . calcium acetate (PHOSLO) 667 MG capsule   Oral   Take 2,001-2,668 mg by mouth 3 (three) times daily with meals. Dosage depends on meal consumed         . clonazePAM  (KLONOPIN) 0.5 MG tablet   Oral   Take 1 mg by mouth at bedtime.          . diphenhydrAMINE (BENADRYL) 50 MG tablet   Oral   Take 50 mg by mouth at bedtime as needed for allergies.          Marland Kitchen EXFORGE 10-160 MG per tablet   Oral   Take 1 tablet by mouth at bedtime.          . gabapentin (NEURONTIN) 100 MG capsule   Oral   Take  100 mg by mouth daily.         Marland Kitchen HYDROcodone-acetaminophen (NORCO) 5-325 MG per tablet   Oral   Take 1 tablet by mouth every 6 (six) hours as needed for pain.   12 tablet   0   . insulin glargine (LANTUS) 100 units/mL SOLN   Subcutaneous   Inject 7 Units into the skin 2 (two) times daily.         . metoprolol succinate (TOPROL-XL) 100 MG 24 hr tablet   Oral   Take 100 mg by mouth at bedtime. Take with or immediately following a meal.         . multivitamin (RENA-VIT) TABS tablet   Oral   Take 1 tablet by mouth daily.         . mycophenolate (CELLCEPT) 250 MG capsule   Oral   Take 250 mg by mouth 2 (two) times daily.         . predniSONE (DELTASONE) 5 MG tablet   Oral   Take 5 mg by mouth daily.          . tacrolimus (PROGRAF) 1 MG capsule   Oral   Take 1 mg by mouth 2 (two) times daily.          BP 176/86  Pulse 116  Temp(Src) 99.3 F (37.4 C) (Oral)  Resp 18  SpO2 100%  LMP 03/14/2013 Physical Exam  Constitutional: She is oriented to person, place, and time. She appears well-developed and well-nourished. No distress.  HENT:  Head: Normocephalic.  Eyes: Conjunctivae are normal. Pupils are equal, round, and reactive to light. No scleral icterus.  Neck: Normal range of motion. Neck supple. No thyromegaly present.  Cardiovascular: Normal rate and regular rhythm.  Exam reveals no gallop and no friction rub.   No murmur heard. Pulmonary/Chest: Effort normal and breath sounds normal. No respiratory distress. She has no wheezes. She has no rales.  No crackles or rales.  Abdominal: Soft. Bowel sounds are normal. She  exhibits no distension. There is no tenderness. There is no rebound.  Abdomen soft. No peritoneal irritation.  Musculoskeletal: Normal range of motion.  Neurological: She is alert and oriented to person, place, and time.  Skin: Skin is warm and dry. No rash noted.  Psychiatric: She has a normal mood and affect. Her behavior is normal.    ED Course  Procedures (including critical care time) Labs Review Labs Reviewed  CBC WITH DIFFERENTIAL - Abnormal; Notable for the following:    RBC 3.50 (*)    Hemoglobin 11.9 (*)    MCV 106.0 (*)    RDW 17.2 (*)    Platelets 134 (*)    Neutrophils Relative % 79 (*)    All other components within normal limits  COMPREHENSIVE METABOLIC PANEL - Abnormal; Notable for the following:    Sodium 133 (*)    Potassium 5.8 (*)    Glucose, Bld 122 (*)    BUN 29 (*)    Creatinine, Ser 7.62 (*)    Calcium 8.3 (*)    Albumin 3.3 (*)    Alkaline Phosphatase 125 (*)    GFR calc non Af Amer 6 (*)    GFR calc Af Amer 7 (*)    All other components within normal limits  LIPASE, BLOOD  BASIC METABOLIC PANEL  CBC  POCT I-STAT TROPONIN I   Imaging Review Dg Chest 2 View  03/21/2013   CLINICAL DATA:  Fever. Short of breath. Chest pain. Abdominal  pain. Cough. Congestion.  EXAM: CHEST  2 VIEW  COMPARISON:  03/16/2013.  FINDINGS: Increasing bilateral interstitial and airspace opacity compared to the prior exam. The cardiopericardial silhouette is mildly enlarged for projection. Mediastinal contours appear within normal limits. Thickening of the fissures is present on the lateral view. The overall constellation of findings is most compatible with CHF/volume overload with interstitial and alveolar pulmonary edema. Superimposed pneumonia is difficult to exclude but not favored. There is no gross pleural fusion. Cholecystectomy clips are present in the right upper quadrant.  IMPRESSION: Constellation of findings most compatible with moderate CHF/volume overload.    Electronically Signed   By: Andreas Newport M.D.   On: 03/21/2013 18:50    EKG Interpretation   None     EKG Interpretation in Muse:  No signs of Hyperkalemia.  No acute ischemic changes.  MDM   1. Colitis    Her labs reassuring. Potassium is 5.8. No EKG changes suggestive of hyperkalemia. Chest x-ray shows suggestion of increased fluid however clinically not in pulmonary edema her oxygen requirements are minimal. I discussed the case with Dr. Lovell Sheehan the [facility] she'll be admitted. Discussed case with nephrology on call. She'll be seen and evaluated and set up for hemodialysis in the morning.   Roney Marion, MD 03/21/13 2101

## 2013-03-21 NOTE — ED Notes (Addendum)
Pt from home, was just discharged from the hospital for DKA. Complaining of CP, epigastric abdominal pain, and blood in stool. Pt does have a history of colitis and COPD and ESRD. Pt is a dialysis pt, too.

## 2013-03-21 NOTE — Progress Notes (Signed)
Called ED, received report from RN.

## 2013-03-21 NOTE — H&P (Signed)
[facility] History and Physical  [name] OZH:[id number] DOB: March 16, 1971 DOA: 03/21/2013  Referring physician: EDP PCP: [name], MD  Specialists:   Chief Complaint:   Rectal Bleeding  HPI: [name] is a 42 y.o. female who presents to the ED with complaints of rectal bleeding BRBPR  X  3 and worsening of ABD pain since last night.   She reports having increased stabbing lower ABD Pain and rates the pain as an 8/1`0.  She presented to dialysis and was not dialyzed and instead she was sent to the ED due to her symptoms.   She was discharged yesterday 03/20/2013 after being hospitalized for Coliits, and was to undergo a sigmoidoscopy as an outpatient.      Review of Systems: The patient denies anorexia, fever, chills, headaches, weight loss,, vision loss, diplopia, dizziness, decreased hearing, rhinitis, hoarseness, chest pain, syncope, dyspnea on exertion, peripheral edema, balance deficits, cough, hemoptysis, nausea, vomiting, diarrhea, constipation, hematemesis, melena, severe indigestion/heartburn, dysuria, hematuria, incontinence, muscle weakness, suspicious skin lesions, transient blindness, difficulty walking, depression, unusual weight change, abnormal bleeding, enlarged lymph nodes, angioedema, and breast masses.    Past Medical History  Diagnosis Date  . Hyperlipidemia   . Alopecia   . Obesity   . Iron deficiency   . 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 RLS (restless legs syndrome)   . Respiratory failure   . Injury of conjunctiva and corneal abrasion of right eye without foreign body   . Cellulitis   . Dialysis patient     Tues; Thurs; Sat; [place]  . 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 Anemia   . Pneumonia 2012    "double"  . Blood transfusion 2004    "when I had my transplant"  . Migraine   . DM (diabetes mellitus), type 1  with renal complications 08/11/2011  . Hyperparathyroidism, secondary   . Diabetic retinopathy   . Hypertension     takes Metoprolol and Exforge daily  . Depression     takes Klonopin nightly  . Diabetes mellitus type 1     Pt states diagnosed at age 33 with prior episodes of DKA. S/p pancreatic transplant   . GERD (gastroesophageal reflux disease)     takes Protonix daily  . PONV (postoperative nausea and vomiting)   . Bronchitis   . Migraine   . Asthma   . Shortness of breath   . Emphysema of lung   . DKA (diabetic ketoacidoses) 07/02/2012  . Angina     none in past 2 years.  . Renal insufficiency     Past Surgical History  Procedure Laterality Date  . 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 Thyroglobulin      x 7  . Av fistula placement      right upper arm  . Cholecystectomy  1995  .  hd graft placement/removal      "had 2 in my left upper arm"  . Eye surgery    . Retinopathy surgery    . Tooth extraction  10/10/11    X's two  . Insertion of dialysis catheter  12/08/2011    Procedure: INSERTION OF DIALYSIS CATHETER;  Surgeon: Chuck Hint, MD;  Location: [facility] OR;  Service: Vascular;  Laterality: Left;  . Av fistula placement  01/22/2012  Procedure: INSERTION OF ARTERIOVENOUS (AV) GORE-TEX GRAFT ARM;  Surgeon: Chuck Hint, MD;  Location: [facility] OR;  Service: Vascular;  Laterality: Left;  Ultrasound guided.  . Av fistula placement  03/14/2012    Procedure: INSERTION OF ARTERIOVENOUS (AV) GORE-TEX GRAFT THIGH;  Surgeon: Chuck Hint, MD;  Location: [facility] OR;  Service: Vascular;  Laterality: Right;  . False aneurysm repair Right 01/02/2013    Procedure: Excision of lymphocele in right thigh.;  Surgeon: Chuck Hint, MD;  Location: [facility] OR;  Service: Vascular;  Laterality: Right;  . Carpal tunnel release Right 03/02/2013    Procedure: CARPAL TUNNEL RELEASE;  Surgeon: Tami Ribas, MD;  Location: [facility];  Service: Orthopedics;  Laterality: Right;    Prior to Admission medications   Medication Sig Start Date End Date Taking? Authorizing Provider  albuterol (PROVENTIL HFA;VENTOLIN HFA) 108 (90 BASE) MCG/ACT inhaler Inhale 2 puffs into the lungs every 4 (four) hours as needed for wheezing or shortness of breath (as per home regimen). 02/20/13  Yes Esperanza Sheets, MD  aspirin EC 81 MG tablet Take 81 mg by mouth daily.   Yes Historical Provider, MD  calcium acetate (PHOSLO) 667 MG capsule Take 2,001-2,668 mg by mouth 3 (three) times daily with meals. Dosage depends on meal consumed 03/12/11  Yes Historical Provider, MD  clonazePAM (KLONOPIN) 0.5 MG tablet Take 1 mg by mouth at bedtime.    Yes Historical Provider, MD  diphenhydrAMINE (BENADRYL) 50 MG tablet Take 50 mg by mouth at bedtime as needed for allergies.    Yes Historical Provider, MD  EXFORGE 10-160 MG per tablet Take 1 tablet by mouth at bedtime.  06/15/12  Yes Historical Provider, MD  gabapentin (NEURONTIN) 100 MG capsule Take 100 mg by mouth daily.   Yes Historical Provider, MD  HYDROcodone-acetaminophen (NORCO) 5-325 MG per tablet Take 1 tablet by mouth every 6 (six) hours as needed for pain. 03/20/13  Yes [name], MD  insulin glargine (LANTUS) 100 units/mL SOLN Inject 7 Units into the skin 2 (two) times daily. 01/04/13  Yes Rhae Lerner, MD  metoprolol succinate (TOPROL-XL) 100 MG 24 hr tablet Take 100 mg by mouth at bedtime. Take with or immediately following a meal.   Yes Historical Provider, MD  multivitamin (RENA-VIT) TABS tablet Take 1 tablet by mouth daily.   Yes Historical Provider, MD  mycophenolate (CELLCEPT) 250 MG capsule Take 250 mg by mouth 2 (two) times daily.   Yes Historical Provider, MD  predniSONE (DELTASONE) 5 MG tablet Take 5 mg by mouth daily.  01/21/11  Yes Historical Provider, MD  tacrolimus (PROGRAF) 1 MG capsule Take 1 mg by mouth 2 (two) times daily.   Yes Historical Provider, MD     Allergies  Allergen Reactions  . Depakote [Divalproex Sodium] Other (See Comments)    Pt gets "delirious"  . Morphine And Related Nausea And Vomiting and Other (See Comments)    "makes me delirious"  . Penicillins Anaphylaxis  . Tramadol Nausea And Vomiting  . Vicodin [Hydrocodone-Acetaminophen] Itching and Rash    Social History:  reports that she has never smoked. She has never used smokeless tobacco. She reports that she does not drink alcohol or use illicit drugs.     Family History  Problem Relation Age of Onset  . Hypertension Mother   . Kidney disease Mother   . Diabetes Mother   . Hypertension Father   . Kidney disease Father   . Diabetes Father  Physical Exam:  GEN:  Pleasant  Obese   42 y.o. African American female  examined  and in no acute distress; cooperative with exam Filed Vitals:   03/21/13 1900 03/21/13 2000 03/21/13 2059 03/21/13 2128  BP: 179/84 176/86  166/90  Pulse: 114 116  113  Temp:   98.8 F (37.1 C) 99.8 F (37.7 C)  TempSrc:   Oral Oral  Resp: 24 18  18   Height:    5\' 1"  (1.549 m)  Weight:    71.895 kg (158 lb 8 oz)  SpO2: 100% 100%  96%   Blood pressure 166/90, pulse 113, temperature 99.8 F (37.7 C), temperature source Oral, resp. rate 18, height 5\' 1"  (1.549 m), weight 71.895 kg (158 lb 8 oz), last menstrual period 03/14/2013, SpO2 96.00%. PSYCH: SHe is alert and oriented x4; does not appear anxious does not appear depressed; affect is normal HEENT: Normocephalic and Atraumatic, Mucous membranes pink; PERRLA; EOM intact; Fundi:  Benign;  No scleral icterus, Nares: Patent, Oropharynx: Clear, Fair Dentition, Neck:  FROM, no cervical lymphadenopathy nor thyromegaly or carotid bruit; no JVD; Breasts:: Not examined CHEST WALL: No tenderness CHEST: Normal respiration, clear to auscultation bilaterally HEART: Regular rate and rhythm; no murmurs rubs or gallops BACK: No kyphosis or scoliosis; no CVA tenderness ABDOMEN: Positive  Bowel Sounds, Obese, soft non-tender; no masses, no organomegaly, no pannus; no intertriginous candida. Rectal Exam: Not done EXTREMITIES: No bone or joint deformity; age-appropriate arthropathy of the hands and knees; no cyanosis, clubbing or edema; no ulcerations. Genitalia: not examined PULSES: 2+ and symmetric SKIN: Normal hydration no rash or ulceration CNS: Cranial nerves 2-12 grossly intact no focal neurologic deficit    Labs on Admission:  Basic Metabolic Panel:  Recent Labs Lab 03/16/13 2220 03/17/13 0400 03/17/13 2300 03/19/13 0753 03/21/13 1819  NA 129* 128* 136 138 133*  K 4.4 4.1 3.3* 3.9 5.8*  CL 90* 89* 95* 100 98  CO2 17* 21 21 23 20   GLUCOSE 209* 113* 199* 47* 122*  BUN 57* 56* 25* 37* 29*  CREATININE 8.75* 9.00* 5.55* 8.31* 7.62*  CALCIUM 8.0* 7.9* 7.9* 7.5* 8.3*  PHOS  --   --   --  3.9  --    Liver Function Tests:  Recent Labs Lab 03/16/13 1145 03/19/13 0753 03/21/13 1819  AST 44*  --  33  ALT 27  --  17  ALKPHOS 156*  --  125*  BILITOT 0.8  --  0.8  PROT 6.7  --  6.9  ALBUMIN 3.3* 3.1* 3.3*    Recent Labs Lab 03/16/13 1145 03/21/13 1819  LIPASE 46 45   No results found for this basename: AMMONIA,  in the last 168 hours CBC:  Recent Labs Lab 03/16/13 1145 03/17/13 0400 03/17/13 2300 03/19/13 0753 03/21/13 1819  WBC 5.5 6.9 5.4 4.9 8.1  NEUTROABS 4.6  --   --   --  6.4  HGB 10.2* 10.0* 9.9* 10.1* 11.9*  HCT 34.3* 30.4* 30.4* 31.5* 37.1  MCV 112.5* 101.7* 103.1* 105.4* 106.0*  PLT 164 169 139* 156 134*   Cardiac Enzymes:  Recent Labs Lab 03/16/13 1145  TROPONINI <0.30    BNP (last 3 results) No results found for this basename: PROBNP,  in the last 8760 hours CBG:  Recent Labs Lab 03/19/13 2223 03/20/13 0835 03/20/13 0905 03/20/13 0945 03/20/13 1218  GLUCAP 268* 44* 69* 106* 78    Radiological Exams on Admission: Dg Chest 2 View  03/21/2013   CLINICAL  DATA:  Fever. Short of breath. Chest pain. Abdominal pain.  Cough. Congestion.  EXAM: CHEST  2 VIEW  COMPARISON:  03/16/2013.  FINDINGS: Increasing bilateral interstitial and airspace opacity compared to the prior exam. The cardiopericardial silhouette is mildly enlarged for projection. Mediastinal contours appear within normal limits. Thickening of the fissures is present on the lateral view. The overall constellation of findings is most compatible with CHF/volume overload with interstitial and alveolar pulmonary edema. Superimposed pneumonia is difficult to exclude but not favored. There is no gross pleural fusion. Cholecystectomy clips are present in the right upper quadrant.  IMPRESSION: Constellation of findings most compatible with moderate CHF/volume overload.   Electronically Signed   By: Andreas Newport M.D.   On: 03/21/2013 18:50      Assessment/Plan Principal Problem:   Rectal bleeding Active Problems:   Abdominal pain   DM (diabetes mellitus), type 1 with renal complications   Hyperlipidemia   Immunosuppression   Colitis   ESRD on hemodialysis     1.   Rectal Bleeding-  Due to Colitis, Monitor H/Hs, consult GI in AM.     2.  ABD Pain- Acute and Chronic, Pain Control with IV Dilaudid.     3.  ESRD on HD-  Dialysis in AM , Renal Notified.    4.  DM-1-  Continue Lantus, and Add SSI coverage PRN.     5.  HTN- continue   6.  Immunosuppression- due to hx of Renal tx, and continue Prograf, and Cellcept.    7.  Colitis-  See #1  8.  SCDs for DVT Prophylaxis.          Code Status:   FULL CODE Family Communication:    No Family Present Disposition Plan:   Observation    Time spent:  52 Minutes  Ron Parker [facility] Pager [phone]  If 7PM-7AM, please contact night-coverage [email or web address] Password [facility] 03/21/2013, 10:34 PM

## 2013-03-22 ENCOUNTER — Encounter (HOSPITAL_COMMUNITY): Payer: Self-pay | Admitting: *Deleted

## 2013-03-22 DIAGNOSIS — N189 Chronic kidney disease, unspecified: Secondary | ICD-10-CM

## 2013-03-22 DIAGNOSIS — K219 Gastro-esophageal reflux disease without esophagitis: Secondary | ICD-10-CM

## 2013-03-22 DIAGNOSIS — D631 Anemia in chronic kidney disease: Secondary | ICD-10-CM

## 2013-03-22 LAB — CBC
HCT: 31.5 % — ABNORMAL LOW (ref 36.0–46.0)
MCH: 34.2 pg — ABNORMAL HIGH (ref 26.0–34.0)
MCV: 104.7 fL — ABNORMAL HIGH (ref 78.0–100.0)
Platelets: 160 10*3/uL (ref 150–400)
RBC: 3.01 MIL/uL — ABNORMAL LOW (ref 3.87–5.11)
RDW: 16.8 % — ABNORMAL HIGH (ref 11.5–15.5)
WBC: 5.4 10*3/uL (ref 4.0–10.5)

## 2013-03-22 LAB — BASIC METABOLIC PANEL
BUN: 38 mg/dL — ABNORMAL HIGH (ref 6–23)
CO2: 20 mEq/L (ref 19–32)
Chloride: 98 mEq/L (ref 96–112)
GFR calc Af Amer: 5 mL/min — ABNORMAL LOW (ref >90)
GFR calc non Af Amer: 5 mL/min — ABNORMAL LOW (ref >90)
Potassium: 6.5 mEq/L (ref 3.5–5.1)
Sodium: 132 mEq/L — ABNORMAL LOW (ref 135–145)

## 2013-03-22 LAB — GLUCOSE, CAPILLARY
Glucose-Capillary: 84 mg/dL (ref 70–99)
Glucose-Capillary: 141 mg/dL — ABNORMAL HIGH (ref 70–99)
Glucose-Capillary: 96 mg/dL (ref 70–99)
Glucose-Capillary: 137 mg/dL — ABNORMAL HIGH (ref 70–99)

## 2013-03-22 LAB — HEMOGLOBIN AND HEMATOCRIT, BLOOD: HCT: 33.4 % — ABNORMAL LOW (ref 36.0–46.0)

## 2013-03-22 MED ORDER — NEPRO/CARBSTEADY PO LIQD
237.0000 mL | ORAL | Status: DC | PRN
  Filled 2013-03-22: qty 237

## 2013-03-22 MED ORDER — ALTEPLASE 2 MG IJ SOLR
2.0000 mg | Freq: Once | INTRAMUSCULAR | Status: DC | PRN
  Filled 2013-03-22: qty 2

## 2013-03-22 MED ORDER — SODIUM CHLORIDE 0.9 % IV SOLN: 100.0000 mL | INTRAVENOUS | Status: DC | PRN

## 2013-03-22 MED ORDER — HYDROMORPHONE HCL PF 1 MG/ML IJ SOLN
INTRAMUSCULAR | Status: AC
  Administered 2013-03-22: 1 mg
  Filled 2013-03-22: qty 1

## 2013-03-22 MED ORDER — LIDOCAINE-PRILOCAINE 2.5-2.5 % EX CREA: 1.0000 "application " | TOPICAL_CREAM | CUTANEOUS | Status: DC | PRN

## 2013-03-22 MED ORDER — HEPARIN SODIUM (PORCINE) 1000 UNIT/ML DIALYSIS: 1000.0000 [IU] | INTRAMUSCULAR | Status: DC | PRN

## 2013-03-22 MED ORDER — POLYETHYLENE GLYCOL 3350 17 GM/SCOOP PO POWD
255.0000 g | Freq: Once | ORAL | Status: AC
  Administered 2013-03-22: 255 g via ORAL
  Filled 2013-03-22: qty 255

## 2013-03-22 MED ORDER — SODIUM CHLORIDE 0.9 % IV SOLN
INTRAVENOUS | Status: DC
  Administered 2013-03-23: 08:00:00 via INTRAVENOUS

## 2013-03-22 MED ORDER — PENTAFLUOROPROP-TETRAFLUOROETH EX AERO: 1.0000 "application " | INHALATION_SPRAY | CUTANEOUS | Status: DC | PRN

## 2013-03-22 MED ORDER — DOXERCALCIFEROL 4 MCG/2ML IV SOLN
1.0000 ug | INTRAVENOUS | Status: DC
  Filled 2013-03-22: qty 2

## 2013-03-22 MED ORDER — LIDOCAINE HCL (PF) 1 % IJ SOLN: 5.0000 mL | INTRAMUSCULAR | Status: DC | PRN

## 2013-03-22 MED ORDER — DOXERCALCIFEROL 4 MCG/2ML IV SOLN: 1.0000 ug | INTRAVENOUS | Status: DC

## 2013-03-22 NOTE — Progress Notes (Addendum)
[facility]                                                                                Patient Demographics  [name], is a 42 y.o. female, DOB - 09-03-1970, BMW:[id number]  Admit date - 03/21/2013   Admitting Physician Ron Parker, MD  Outpatient Primary MD for the patient is [facility], [name], MD  LOS - 1   Chief Complaint  Patient presents with  . Abdominal Pain        Assessment & Plan    Principal Problem:   Rectal bleeding Active Problems:   Abdominal pain   DM (diabetes mellitus), type 1 with renal complications   Hyperlipidemia   Immunosuppression   Colitis   ESRD on hemodialysis  Rectal Bleeding -Possibe secondary to colitis, which may be secondary to cellcept. -Will consult gastroenterology for possible intervention  Anemia -Secondary to renal disease and rectal bleeding. -Patient currently hemodynamically stable. -Continue monitoring hemoglobin and hematocrit. Hemoglobin 11.9.  Intractable Abdominal pain, Acute on chronic -Continue pain control with IV Dilaudid -Likely secondary to colitis.  End stage renal disease, requiring hemodialysis -Nephrology consulted  Diabetes mellitus, Type 1 -Continue Lantus and ISS.    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   Code Status: Full  Family Communication: None  Disposition Plan: SCDs   Procedures None  Consults   [facility]  DVT Prophylaxis SCDs  Lab Results  Component Value Date   PLT 134* 03/21/2013    Medications  Scheduled Meds: . amLODipine  10 mg Oral QHS   And  . irbesartan  150 mg Oral QHS  . calcium acetate  2,001-2,668 mg Oral TID WC  . clonazePAM  1 mg Oral QHS  . gabapentin  100 mg Oral QHS  . heparin  5,000 Units Dialysis Once in dialysis  . insulin aspart  0-9 Units Subcutaneous Q4H  . insulin glargine  7 Units Subcutaneous BID  .  metoprolol succinate  100 mg Oral QHS  . multivitamin  1 tablet Oral QHS  . mycophenolate  250 mg Oral BID  . pantoprazole (PROTONIX) IV  40 mg Intravenous QHS  . predniSONE  5 mg Oral Q breakfast  . sodium chloride  3 mL Intravenous Q12H  . tacrolimus  1 mg Oral BID   Continuous Infusions:  PRN Meds:.sodium chloride, sodium chloride, sodium chloride, acetaminophen, acetaminophen, albuterol, diphenhydrAMINE, HYDROmorphone (DILAUDID) injection, ondansetron (ZOFRAN) IV, ondansetron, oxyCODONE, sodium chloride, zolpidem  Antibiotics   Anti-infectives   None     Time Spent in minutes   35 minutes   Efren Kross D.O. on 03/22/2013 at 11:38 AM  Between 7am to 7pm - Pager - [phone]  After 7pm go to [email or web address] - password [email or web address]  And look for the night coverage person covering for me after hours  [facility] Office  [phone]    Subjective:   [name] seen and examined today.  Patient continues to complain of abdominal pain. She also continues to have bright red stool per rectum. Patient is apprehensive about using enemas. Patient denies dizziness, chest pain, shortness of breath, N/V/D/C,  new weakness, numbess, tingling.    Objective:   Filed Vitals:   03/21/13 2059 03/21/13 2128 03/22/13 0403 03/22/13 1020  BP:  166/90 121/78 142/76  Pulse:  113 86 94  Temp: 98.8 F (37.1 C) 99.8 F (37.7 C) 97.4 F (36.3 C) 98.6 F (37 C)  TempSrc: Oral Oral Oral Oral  Resp:  18 18 18   Height:  5\' 1"  (1.549 m)    Weight:  71.895 kg (158 lb 8 oz)    SpO2:  96% 100% 100%    Wt Readings from Last 3 Encounters:  03/21/13 71.895 kg (158 lb 8 oz)  03/19/13 68.7 kg (151 lb 7.3 oz)  03/19/13 68.7 kg (151 lb 7.3 oz)     Intake/Output Summary (Last 24 hours) at 03/22/13 1138 Last data filed at 03/21/13 2129  Gross per 24 hour  Intake    240 ml  Output      0 ml  Net    240 ml    Exam  General: Well developed, well nourished, NAD, appears stated  age  HEENT: NCAT, PERRLA, EOMI, Anicteic Sclera, mucous membranes moist. No pharyngeal erythema or exudates  Neck: Supple, no JVD, no masses  Cardiovascular: S1 S2 auscultated, no rubs, murmurs or gallops. Regular rate and rhythm.  Respiratory: Clear to auscultation bilaterally with equal chest rise  Abdomen: Soft, obese, tender, nondistended, + bowel sounds  Extremities: warm dry without cyanosis clubbing or edema  Neuro: AAOx3, cranial nerves grossly intact.   Skin: Without rashes exudates or nodules  Psych: Normal affect and demeanor with intact judgement and insight   Data Review   Micro Results Recent Results (from the past 240 hour(s))  MRSA PCR SCREENING     Status: None   Collection Time    03/16/13  9:47 PM      Result Value Range Status   MRSA by PCR NEGATIVE  NEGATIVE Final   Comment:            The GeneXpert MRSA Assay (FDA     approved for NASAL specimens     only), is one component of a     comprehensive MRSA colonization     surveillance program. It is not     intended to diagnose MRSA     infection nor to guide or     monitor treatment for     MRSA infections.    Radiology Reports Dg Chest 2 View  03/21/2013   CLINICAL DATA:  Fever. Short of breath. Chest pain. Abdominal pain. Cough. Congestion.  EXAM: CHEST  2 VIEW  COMPARISON:  03/16/2013.  FINDINGS: Increasing bilateral interstitial and airspace opacity compared to the prior exam. The cardiopericardial silhouette is mildly enlarged for projection. Mediastinal contours appear within normal limits. Thickening of the fissures is present on the lateral view. The overall constellation of findings is most compatible with CHF/volume overload with interstitial and alveolar pulmonary edema. Superimposed pneumonia is difficult to exclude but not favored. There is no gross pleural fusion. Cholecystectomy clips are present in the right upper quadrant.  IMPRESSION: Constellation of findings most compatible with  moderate CHF/volume overload.   Electronically Signed   By: Andreas Newport M.D.   On: 03/21/2013 18:50   Dg Chest 2 View  03/16/2013   CLINICAL DATA:  Shortness of breath  EXAM: CHEST  2 VIEW  COMPARISON:  02/19/2013  FINDINGS: The cardio pericardial silhouette is enlarged. There is right base atelectasis or infiltrate. Small right pleural effusion associated. Imaged  bony structures of the thorax are intact.  IMPRESSION: Right base atelectasis or pneumonia with small right pleural effusion.   Electronically Signed   By: Kennith Center M.D.   On: 03/16/2013 17:34   Ct Abdomen Pelvis W Contrast  03/16/2013   CLINICAL DATA:  Diffuse stabbing abdominal pain with nausea and vomiting for 2 days, chronic dialysis  EXAM: CT ABDOMEN AND PELVIS WITH CONTRAST  TECHNIQUE: Multidetector CT imaging of the abdomen and pelvis was performed using the standard protocol following bolus administration of intravenous contrast.  CONTRAST:  OMNIPAQUE IOHEXOL 300 MG/ML  SOLN  COMPARISON:  06/10/2012  FINDINGS: There is mild scattered bilateral hazy ground-glass attenuation with more focal dependent atelectasis bilaterally. There is a tiny left and small to moderate right pleural effusion.  There are no focal hepatic abnormalities. There is mild periportal edema. The spleen, pancreas, and adrenal glands are normal. There is severe bilateral renal atrophy and cystic change. 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 There is a nonobstructive bowel gas pattern with mild diffuse colon wall thickening. There is also mild infiltration of the omentum and inflammatory change in the adjacent fat. These findings are similar to the prior study. The appendix is normal. Reproductive organs and bladder are normal. There is a small volume of ascites. Small transplanted kidney left flank unchanged. There are no acute musculoskeletal findings.  IMPRESSION: Diffuse colon wall thickening with mild infiltration  of the omentum. The findings again suggest a diffuse colitis.   Electronically Signed   By: Esperanza Heir M.D.   On: 03/16/2013 17:45   Nm Pulmonary Perf And Vent  03/16/2013   CLINICAL DATA:  Shortness of breath  EXAM: NUCLEAR MEDICINE VENTILATION - PERFUSION LUNG SCAN  TECHNIQUE: Ventilation images were obtained in multiple projections using inhaled aerosol technetium 99 M DTPA. Perfusion images were obtained in multiple projections after intravenous injection of Tc-18m MAA.  COMPARISON:  02/27/2011.  RADIOPHARMACEUTICALS:  40 mCi Tc-69m DTPA aerosol and 6 mCi Tc-63m MAA  FINDINGS: Ventilation: No focal ventilation defect.  Perfusion: No wedge shaped peripheral perfusion defects to suggest acute pulmonary embolism.  IMPRESSION: Very low probability for pulmonary embolus. .   Electronically Signed   By: Kennith Center M.D.   On: 03/16/2013 17:21    CBC  Recent Labs Lab 03/16/13 1145 03/17/13 0400 03/17/13 2300 03/19/13 0753 03/21/13 1819  WBC 5.5 6.9 5.4 4.9 8.1  HGB 10.2* 10.0* 9.9* 10.1* 11.9*  HCT 34.3* 30.4* 30.4* 31.5* 37.1  PLT 164 169 139* 156 134*  MCV 112.5* 101.7* 103.1* 105.4* 106.0*  MCH 33.4 33.4 33.6 33.8 34.0  MCHC 29.7* 32.9 32.6 32.1 32.1  RDW 17.1* 16.4* 17.1* 18.0* 17.2*  LYMPHSABS 0.6*  --   --   --  1.1  MONOABS 0.2  --   --   --  0.4  EOSABS 0.1  --   --   --  0.1  BASOSABS 0.0  --   --   --  0.0    Chemistries   Recent Labs Lab 03/16/13 1145  03/16/13 2220 03/17/13 0400 03/17/13 2300 03/19/13 0753 03/21/13 1819  NA 120*  < > 129* 128* 136 138 133*  K 5.7*  < > 4.4 4.1 3.3* 3.9 5.8*  CL 81*  < > 90* 89* 95* 100 98  CO2 18*  < > 17* 21 21 23 20   GLUCOSE 1076*  < > 209* 113* 199* 47* 122*  BUN 52*  < >  57* 56* 25* 37* 29*  CREATININE 8.29*  < > 8.75* 9.00* 5.55* 8.31* 7.62*  CALCIUM 7.7*  < > 8.0* 7.9* 7.9* 7.5* 8.3*  AST 44*  --   --   --   --   --  33  ALT 27  --   --   --   --   --  17  ALKPHOS 156*  --   --   --   --   --  125*  BILITOT 0.8   --   --   --   --   --  0.8  < > = values in this interval not displayed. ------------------------------------------------------------------------------------------------------------------ estimated creatinine clearance is 8.9 ml/min (by C-G formula based on Cr of 7.62). ------------------------------------------------------------------------------------------------------------------ No results found for this basename: HGBA1C,  in the last 72 hours ------------------------------------------------------------------------------------------------------------------ No results found for this basename: CHOL, HDL, LDLCALC, TRIG, CHOLHDL, LDLDIRECT,  in the last 72 hours ------------------------------------------------------------------------------------------------------------------ No results found for this basename: TSH, T4TOTAL, FREET3, T3FREE, THYROIDAB,  in the last 72 hours ------------------------------------------------------------------------------------------------------------------ No results found for this basename: VITAMINB12, FOLATE, FERRITIN, TIBC, IRON, RETICCTPCT,  in the last 72 hours  Coagulation profile No results found for this basename: INR, PROTIME,  in the last 168 hours  No results found for this basename: DDIMER,  in the last 72 hours  Cardiac Enzymes  Recent Labs Lab 03/16/13 1145  TROPONINI <0.30   ------------------------------------------------------------------------------------------------------------------ No components found with this basename: POCBNP,

## 2013-03-22 NOTE — Progress Notes (Signed)
Pt noted to have an incision on her right palm, near the wrist. States she had recent carpal tunnel surgery. Incision clean, dry, and intact, with black sutures present. No dressing noted. Will monitor.  Kathlene November, [place]

## 2013-03-22 NOTE — Progress Notes (Signed)
[name] R Bagnall 1:03 PM  Subjective: Patient discussed with hospital team and seen and examined and discussed with her and her mother and my partners notes were reviewed as was the hospital chart computer and she's continued to have some diarrhea and abdominal pain and is willing to have a flexible sigmoidoscopy but refuses enemas due  to problems in  her younger life with them  Objective: Vital signs stable afebrile no acute distress abdomen is soft nontender labs reviewed CT reviewed  Assessment: Multiple medical problems probable colitis  Plan: The risks benefits methods of flexible sigmoidoscopy possible colonoscopy was discussed with the patient and her mother and one of my partners will proceed tomorrow morning with further workup and plans pending those findings  Medardo Hassing E

## 2013-03-22 NOTE — Progress Notes (Signed)
[facility] Progress Note  Subjective:   No BMs since arrival. Has not eaten anything either. Still with generalized abdominal pain  Objective Filed Vitals:   03/21/13 2059 03/21/13 2128 03/22/13 0403 03/22/13 1020  BP:  166/90 121/78 142/76  Pulse:  113 86 94  Temp: 98.8 F (37.1 C) 99.8 F (37.7 C) 97.4 F (36.3 C) 98.6 F (37 C)  TempSrc: Oral Oral Oral Oral  Resp:  18 18 18   Height:  5\' 1"  (1.549 m)    Weight:  71.895 kg (158 lb 8 oz)    SpO2:  96% 100% 100%   Physical Exam General: NAD talking on the phone Heart: RRR ~90s Lungs: no wheezes or rales Abdomen: soft mild generalize tenderness + BS Extremities: no LE edmea Dialysis Access: right thigh graft patent with stable small aneurysm on lateral size  Dialysis Orders: ([facility])  Optiflx 160 3.75 hr EDW 68.5 2K 2.5 Ca heparin 5000  Hectorol 1 Epo 10,000 right thigh graft  Recent labs: Hdg 10.1 10/16; Sept labs iPTH 213 Ca 9 P 1.6 (2.5 - August)) Hgb A!C 11.4 - July 2014    Assessment/Plan:   42 y/o female with ESRD, hx failed renal/pancreas tx d/c with abdominal pain/colitis. She had refused GI for sigmoidoscopy during hospitalization and given her stability, she was d/c home and plans were for it to be done as an outpt.  She missed dialysis Sat due to report BRBPR x 3 and worsening abdominal pain 10/25 and therefore came to the ED for evaluation and was subsequently admitted.  BUN 29 K 5.8 and Hgb 11.9 yesterday  1. Abdominal pain/colitis - per primary; no BM since admission prob due to mycophenalate, have d/w transplant team at [facility] (Dr Mortimer Fries), they recommend stopping mycophenalate at this time, cont prograf and prednisone, shoot for prograf level of 4-5; have d/w pt who is agreeable, so will stop mycophenalate and order prograf level  2. ESRD with hx failed KP tx on low dose immunosuppression per [facility] MD; prograf, prenidsone and cellcept; per Dr. Malena Catholic note 10/24 -felt to have residual pancreas  fx given low insulin requirement which is the reason for continued IS;  TTS with hyperkalemia; missed HD Sat - plan today 3. Anemia - with BRBPR reported prior to admission - Hgb 11.9 - hold ESA for now - recheck today pre HD 4. Secondary hyperparathyroidism - recently outpt P had been low; cont hectorol;   P 3.9 last admission off phoslo - since on CL now; will jut hold phoslo and follow conservatively 5. HTN/volume - back on usual meds 6. Nutrition - would advance diet if no tests 7. DM - per primary 8. 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 Edilia Bo last admission - needs elective repair after colitis resolved  Sheffield Slider, PA-C  [facility] [phone] 03/22/2013,10:30 AM  LOS: 1 day   I have seen and examined patient, discussed with PA and agree with assessment and plan as outlined above with additions as indicated. Vinson Moselle MD pager [phone]    cell [phone] 03/22/2013, 2:50 PM      Additional Objective Labs: Basic Metabolic Panel:  Recent Labs Lab 03/17/13 2300 03/19/13 0753 03/21/13 1819  NA 136 138 133*  K 3.3* 3.9 5.8*  CL 95* 100 98  CO2 21 23 20   GLUCOSE 199* 47* 122*  BUN 25* 37* 29*  CREATININE 5.55* 8.31* 7.62*  CALCIUM 7.9* 7.5* 8.3*  PHOS  --  3.9  --  Liver Function Tests:  Recent Labs Lab 03/16/13 1145 03/19/13 0753 03/21/13 1819  AST 44*  --  33  ALT 27  --  17  ALKPHOS 156*  --  125*  BILITOT 0.8  --  0.8  PROT 6.7  --  6.9  ALBUMIN 3.3* 3.1* 3.3*    Recent Labs Lab 03/16/13 1145 03/21/13 1819  LIPASE 46 45   CBC:  Recent Labs Lab 03/16/13 1145 03/17/13 0400 03/17/13 2300 03/19/13 0753 03/21/13 1819  WBC 5.5 6.9 5.4 4.9 8.1  NEUTROABS 4.6  --   --   --  6.4  HGB 10.2* 10.0* 9.9* 10.1* 11.9*  HCT 34.3* 30.4* 30.4* 31.5* 37.1  MCV 112.5* 101.7* 103.1* 105.4* 106.0*  PLT 164 169 139* 156 134*   Blood Culture    Component Value Date/Time   SDES BLOOD RIGHT HAND 02/17/2013  0708   SPECREQUEST BOTTLES DRAWN AEROBIC AND ANAEROBIC 10CC 02/17/2013 0708   CULT  Value: NO GROWTH 5 DAYS Performed at [facility] 02/17/2013 0708   REPTSTATUS 02/23/2013 FINAL 02/17/2013 0708    Cardiac Enzymes:  Recent Labs Lab 03/16/13 1145  TROPONINI <0.30   CBG:  Recent Labs Lab 03/20/13 1218 03/21/13 2126 03/21/13 2301 03/22/13 0407 03/22/13 0731  GLUCAP 78 49* 90 84 141*   Studies/Results: Dg Chest 2 View  03/21/2013   CLINICAL DATA:  Fever. Short of breath. Chest pain. Abdominal pain. Cough. Congestion.  EXAM: CHEST  2 VIEW  COMPARISON:  03/16/2013.  FINDINGS: Increasing bilateral interstitial and airspace opacity compared to the prior exam. The cardiopericardial silhouette is mildly enlarged for projection. Mediastinal contours appear within normal limits. Thickening of the fissures is present on the lateral view. The overall constellation of findings is most compatible with CHF/volume overload with interstitial and alveolar pulmonary edema. Superimposed pneumonia is difficult to exclude but not favored. There is no gross pleural fusion. Cholecystectomy clips are present in the right upper quadrant.  IMPRESSION: Constellation of findings most compatible with moderate CHF/volume overload.   Electronically Signed   By: Andreas Newport M.D.   On: 03/21/2013 18:50   Medications:   . amLODipine  10 mg Oral QHS   And  . irbesartan  150 mg Oral QHS  . calcium acetate  2,001-2,668 mg Oral TID WC  . clonazePAM  1 mg Oral QHS  . gabapentin  100 mg Oral QHS  . heparin  5,000 Units Dialysis Once in dialysis  . insulin aspart  0-9 Units Subcutaneous Q4H  . insulin glargine  7 Units Subcutaneous BID  . metoprolol succinate  100 mg Oral QHS  . multivitamin  1 tablet Oral QHS  . mycophenolate  250 mg Oral BID  . pantoprazole (PROTONIX) IV  40 mg Intravenous QHS  . predniSONE  5 mg Oral Q breakfast  . sodium chloride  3 mL Intravenous Q12H  . tacrolimus  1 mg Oral BID

## 2013-03-23 ENCOUNTER — Inpatient Hospital Stay (HOSPITAL_COMMUNITY): Payer: Medicare Other

## 2013-03-23 ENCOUNTER — Encounter (HOSPITAL_COMMUNITY): Payer: Self-pay

## 2013-03-23 DIAGNOSIS — E669 Obesity, unspecified: Secondary | ICD-10-CM

## 2013-03-23 LAB — HEMOGLOBIN AND HEMATOCRIT, BLOOD
HCT: 33.3 % — ABNORMAL LOW (ref 36.0–46.0)
Hemoglobin: 10.6 g/dL — ABNORMAL LOW (ref 12.0–15.0)

## 2013-03-23 LAB — GLUCOSE, CAPILLARY
Glucose-Capillary: 265 mg/dL — ABNORMAL HIGH (ref 70–99)
Glucose-Capillary: 16 mg/dL — CL (ref 70–99)
Glucose-Capillary: 125 mg/dL — ABNORMAL HIGH (ref 70–99)
Glucose-Capillary: 24 mg/dL — CL (ref 70–99)
Glucose-Capillary: 26 mg/dL — CL (ref 70–99)
Glucose-Capillary: 117 mg/dL — ABNORMAL HIGH (ref 70–99)
Glucose-Capillary: 438 mg/dL — ABNORMAL HIGH (ref 70–99)
Glucose-Capillary: 591 mg/dL (ref 70–99)

## 2013-03-23 LAB — BASIC METABOLIC PANEL
BUN: 14 mg/dL (ref 6–23)
CO2: 25 mEq/L (ref 19–32)
Calcium: 8.1 mg/dL — ABNORMAL LOW (ref 8.4–10.5)
Creatinine, Ser: 5.74 mg/dL — ABNORMAL HIGH (ref 0.50–1.10)
GFR calc Af Amer: 10 mL/min — ABNORMAL LOW (ref >90)
Potassium: 4.9 mEq/L (ref 3.5–5.1)
Sodium: 136 mEq/L (ref 135–145)

## 2013-03-23 LAB — POTASSIUM: Potassium: 3.8 mEq/L (ref 3.5–5.1)

## 2013-03-23 MED ORDER — INSULIN GLARGINE 100 UNIT/ML ~~LOC~~ SOLN
5.0000 [IU] | Freq: Once | SUBCUTANEOUS | Status: AC
  Administered 2013-03-23: 5 [IU] via SUBCUTANEOUS
  Filled 2013-03-23 (×2): qty 0.05

## 2013-03-23 MED ORDER — GLUCOSE 40 % PO GEL
ORAL | Status: AC
  Filled 2013-03-23: qty 1

## 2013-03-23 MED ORDER — DEXTROSE 50 % IV SOLN
INTRAVENOUS | Status: AC
  Filled 2013-03-23: qty 50

## 2013-03-23 MED ORDER — INSULIN ASPART 100 UNIT/ML ~~LOC~~ SOLN
12.0000 [IU] | Freq: Once | SUBCUTANEOUS | Status: AC
  Administered 2013-03-23: 12 [IU] via SUBCUTANEOUS

## 2013-03-23 MED ORDER — BOOST / RESOURCE BREEZE PO LIQD
1.0000 | Freq: Two times a day (BID) | ORAL | Status: DC
  Administered 2013-03-23 – 2013-03-25 (×2): 1 via ORAL

## 2013-03-23 MED ORDER — SODIUM CHLORIDE 0.9 % IV SOLN
1.0000 g | Freq: Once | INTRAVENOUS | Status: AC
  Administered 2013-03-23: 1 g via INTRAVENOUS
  Filled 2013-03-23: qty 10

## 2013-03-23 MED ORDER — SODIUM POLYSTYRENE SULFONATE 15 GM/60ML PO SUSP: 30.0000 g | Freq: Once | ORAL | Status: DC

## 2013-03-23 MED ORDER — DEXTROSE 50 % IV SOLN
50.0000 mL | INTRAVENOUS | Status: DC | PRN
  Administered 2013-03-23 (×2): 50 mL via INTRAVENOUS
  Administered 2013-03-26: 25 mL via INTRAVENOUS
  Filled 2013-03-23 (×2): qty 50

## 2013-03-23 NOTE — Progress Notes (Signed)
One cup of Ginger Ale given PO (8 oz.) and flex sig procedure cancelled per Dr. Matthias Hughs.

## 2013-03-23 NOTE — Progress Notes (Signed)
The patient's sigmoidoscopy was canceled this morning, when she arrived to the endoscopy unit unresponsive and her blood sugar was noted to be 26.  At this time, the patient is up and walking around in no distress whatsoever, although she indicates that she is still having abdominal pain. She is also having frequent, loose, bloody bowel movements. She indicates she has had 4 bowel movements so far today.  On exam, as noted she is in no distress. The abdomen is slightly adipose, but soft and without any guarding or rebound despite subjective tenderness to palpation.  Impression: Symptomatic and radiographic evidence for colitis, etiology unclear.  Plan: Proceed with sigmoidoscopy tomorrow morning to clarify the nature of her colitis.  Florencia Reasons, M.D. [phone]

## 2013-03-23 NOTE — Progress Notes (Signed)
INITIAL NUTRITION ASSESSMENT  DOCUMENTATION CODES Per approved criteria  -Not Applicable   INTERVENTION:  Resource Breeze PO BID, each supplement provides 250 kcal and 9 grams of protein.  NUTRITION DIAGNOSIS: Inadequate oral intake related to altered GI function as evidenced by clear liquid diet.   Goal: Intake to meet >90% of estimated nutrition needs.  Monitor:  Diet advancement, PO intake, labs, weight trend.  Reason for Assessment: MST=2  42 y.o. female  Admitting Dx: Rectal bleeding  ASSESSMENT: Patient admitted on 10/25 with GI bleed. Plans for sigmoidoscopy tomorrow. Remains on clear liquid diet for now. Patient with a history of ESRD, usually receives HD on Tuesday, Thursday, and Saturday. Patient reports a lot of weight loss over the past year, likely partially related to HD and fluid loss. She says that her clothes are fitting much looser than they were a few months ago. Nutrition focused physical exam completed.  No muscle or subcutaneous fat depletion noticed.  Height: Ht Readings from Last 1 Encounters:  03/22/13 5\' 1"  (1.549 m)    Weight: Wt Readings from Last 1 Encounters:  03/22/13 152 lb 12.1 oz (69.29 kg)    Ideal Body Weight: 47.7 kg  % Ideal Body Weight: 145%  Wt Readings from Last 25 Encounters:  03/22/13 152 lb 12.1 oz (69.29 kg)  03/22/13 152 lb 12.1 oz (69.29 kg)  03/19/13 151 lb 7.3 oz (68.7 kg)  03/19/13 151 lb 7.3 oz (68.7 kg)  03/02/13 151 lb 3.2 oz (68.584 kg)  03/02/13 151 lb 3.2 oz (68.584 kg)  02/19/13 149 lb 5 oz (67.728 kg)  01/17/13 154 lb 5.2 oz (70 kg)  01/04/13 145 lb (65.772 kg)  01/01/13 153 lb 6.4 oz (69.582 kg)  12/31/12 155 lb (70.308 kg)  12/24/12 160 lb (72.576 kg)  12/17/12 158 lb (71.668 kg)  11/18/12 153 lb 14.1 oz (69.8 kg)  11/03/12 164 lb 12.8 oz (74.753 kg)  07/05/12 150 lb 12.7 oz (68.4 kg)  06/25/12 163 lb 14.4 oz (74.345 kg)  06/12/12 161 lb 6 oz (73.2 kg)  05/14/12 164 lb (74.39 kg)  04/21/12 168 lb  (76.204 kg)  04/16/12 166 lb (75.297 kg)  04/02/12 163 lb (73.936 kg)  03/15/12 164 lb 0.4 oz (74.4 kg)  03/15/12 164 lb 0.4 oz (74.4 kg)  03/03/12 140 lb (63.504 kg)    Usual Body Weight: 150 lb (February 2014)  % Usual Body Weight: 101%  BMI:  Body mass index is 28.88 kg/(m^2).  Estimated Nutritional Needs: Kcal: 1800-2000 Protein: 80-100 gm Fluid: 1.2 L  Skin: no wounds  Diet Order: Clear Liquid  EDUCATION NEEDS: -Education not appropriate at this time   Intake/Output Summary (Last 24 hours) at 03/23/13 1412 Last data filed at 03/23/13 1125  Gross per 24 hour  Intake    476 ml  Output   3000 ml  Net  -2524 ml    Last BM: 10/26   Labs:   Recent Labs Lab 03/19/13 0753 03/21/13 1819 03/22/13 1200 03/23/13 0618 03/23/13 0858  NA 138 133* 132* 136  --   K 3.9 5.8* 6.5* 4.9 3.8  CL 100 98 98 97  --   CO2 23 20 20 25   --   BUN 37* 29* 38* 14  --   CREATININE 8.31* 7.62* 9.46* 5.74*  --   CALCIUM 7.5* 8.3* 8.1* 8.1*  --   PHOS 3.9  --   --   --   --   GLUCOSE 47* 122* 93 87  --  CBG (last 3)   Recent Labs  03/23/13 1008 03/23/13 1034 03/23/13 1124  GLUCAP 24* 136* 117*    Scheduled Meds: . amLODipine  10 mg Oral QHS   And  . irbesartan  150 mg Oral QHS  . clonazePAM  1 mg Oral QHS  . gabapentin  100 mg Oral QHS  . insulin aspart  0-9 Units Subcutaneous Q4H  . metoprolol succinate  100 mg Oral QHS  . multivitamin  1 tablet Oral QHS  . pantoprazole (PROTONIX) IV  40 mg Intravenous QHS  . predniSONE  5 mg Oral Q breakfast  . sodium chloride  3 mL Intravenous Q12H  . tacrolimus  1 mg Oral BID    Continuous Infusions: . sodium chloride 10 mL/hr at 03/23/13 3664    Past Medical History  Diagnosis Date  . Hyperlipidemia   . Alopecia   . Obesity   . Iron deficiency   . 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 RLS (restless legs syndrome)   . Respiratory failure   . Injury of  conjunctiva and corneal abrasion of right eye without foreign body   . Cellulitis   . Dialysis patient     Tues; Thurs; Sat; [place]  . 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 Anemia   . Pneumonia 2012    "double"  . Blood transfusion 2004    "when I had my transplant"  . Migraine   . 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 Hyperparathyroidism, secondary   . Diabetic retinopathy   . Hypertension     takes Metoprolol and Exforge daily  . Depression     takes Klonopin nightly  . Diabetes mellitus type 1     Pt states diagnosed at age 50 with prior episodes of DKA. S/p pancreatic transplant   . GERD (gastroesophageal reflux disease)     takes Protonix daily  . PONV (postoperative nausea and vomiting)   . Bronchitis   . Migraine   . Asthma   . Shortness of breath   . Emphysema of lung   . DKA (diabetic ketoacidoses) 07/02/2012  . Angina     none in past 2 years.  . Renal insufficiency     Past Surgical History  Procedure Laterality Date  . 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 Thyroglobulin      x 7  . Av fistula placement      right upper arm  . Cholecystectomy  1995  .  hd graft placement/removal      "had 2 in my left upper arm"  . Eye surgery    . Retinopathy surgery    . Tooth extraction  10/10/11    X's two  . Insertion of dialysis catheter  12/08/2011    Procedure: INSERTION OF DIALYSIS CATHETER;  Surgeon: Chuck Hint, MD;  Location: [facility] OR;  Service: Vascular;  Laterality: Left;  . Av fistula placement  01/22/2012    Procedure: INSERTION OF ARTERIOVENOUS (AV) GORE-TEX GRAFT ARM;  Surgeon: Chuck Hint, MD;  Location: [facility] OR;  Service: Vascular;  Laterality: Left;  Ultrasound guided.  . Av fistula placement  03/14/2012    Procedure: INSERTION OF ARTERIOVENOUS (AV) GORE-TEX GRAFT THIGH;  Surgeon: Chuck Hint, MD;  Location: [facility]  OR;  Service: Vascular;  Laterality:  Right;  . False aneurysm repair Right 01/02/2013    Procedure: Excision of lymphocele in right thigh.;  Surgeon: Chuck Hint, MD;  Location: [facility] OR;  Service: Vascular;  Laterality: Right;  . Carpal tunnel release Right 03/02/2013    Procedure: CARPAL TUNNEL RELEASE;  Surgeon: Tami Ribas, MD;  Location: [facility];  Service: Orthopedics;  Laterality: Right;    Joaquin Courts, RD, LDN, CNSC Pager [phone] After Hours Pager [phone]

## 2013-03-23 NOTE — Progress Notes (Signed)
[facility] ROUNDING NOTE   Subjective:   Interval History: hypoglycemia this AM now better, somnolent  Objective:  Vital signs in last 24 hours:  Temp:  [98.2 F (36.8 C)-99.4 F (37.4 C)] 98.4 F (36.9 C) (10/27 0512) Pulse Rate:  [85-106] 105 (10/27 0512) Resp:  [16-18] 18 (10/27 0512) BP: (103-142)/(56-84) 138/62 mmHg (10/27 0512) SpO2:  [95 %-100 %] 95 % (10/27 0512) Weight:  [69.29 kg (152 lb 12.1 oz)-71.7 kg (158 lb 1.1 oz)] 69.29 kg (152 lb 12.1 oz) (10/26 1930)  Weight change: -0.195 kg (-6.9 oz) Filed Weights   03/22/13 1330 03/22/13 1907 03/22/13 1930  Weight: 71.7 kg (158 lb 1.1 oz) 69.3 kg (152 lb 12.5 oz) 69.29 kg (152 lb 12.1 oz)    Intake/Output: I/O last 3 completed shifts: In: 480 [P.O.:480] Out: 3000 [Other:3000]   Intake/Output this shift:     General: somnolent Heart: RRR ~90s  Lungs: no wheezes or rales  Abdomen: soft mild generalize tenderness + BS  Extremities: no LE edmea  Dialysis Access: right thigh graft patent with stable small aneurysm on lateral size noted    Basic Metabolic Panel:  Recent Labs Lab 03/17/13 2300 03/19/13 0753 03/21/13 1819 03/22/13 1200 03/23/13 0618  NA 136 138 133* 132* 136  K 3.3* 3.9 5.8* 6.5* 4.9  CL 95* 100 98 98 97  CO2 21 23 20 20 25   GLUCOSE 199* 47* 122* 93 87  BUN 25* 37* 29* 38* 14  CREATININE 5.55* 8.31* 7.62* 9.46* 5.74*  CALCIUM 7.9* 7.5* 8.3* 8.1* 8.1*  PHOS  --  3.9  --   --   --     Liver Function Tests:  Recent Labs Lab 03/16/13 1145 03/19/13 0753 03/21/13 1819  AST 44*  --  33  ALT 27  --  17  ALKPHOS 156*  --  125*  BILITOT 0.8  --  0.8  PROT 6.7  --  6.9  ALBUMIN 3.3* 3.1* 3.3*    Recent Labs Lab 03/16/13 1145 03/21/13 1819  LIPASE 46 45   No results found for this basename: AMMONIA,  in the last 168 hours  CBC:  Recent Labs Lab 03/16/13 1145 03/17/13 0400 03/17/13 2300 03/19/13 0753 03/21/13 1819 03/22/13 1200 03/22/13 2055 03/23/13 0618   WBC 5.5 6.9 5.4 4.9 8.1 5.4  --   --   NEUTROABS 4.6  --   --   --  6.4  --   --   --   HGB 10.2* 10.0* 9.9* 10.1* 11.9* 10.3* 10.7* 10.6*  HCT 34.3* 30.4* 30.4* 31.5* 37.1 31.5* 33.4* 33.3*  MCV 112.5* 101.7* 103.1* 105.4* 106.0* 104.7*  --   --   PLT 164 169 139* 156 134* 160  --   --     Cardiac Enzymes:  Recent Labs Lab 03/16/13 1145  TROPONINI <0.30    BNP: No components found with this basename: POCBNP,   CBG:  Recent Labs Lab 03/22/13 1940 03/23/13 0219 03/23/13 0459 03/23/13 0751 03/23/13 0853  GLUCAP 137* 265* 179* 16* 125*    Microbiology: Results for orders placed during the hospital encounter of 03/16/13  MRSA PCR SCREENING     Status: None   Collection Time    03/16/13  9:47 PM      Result Value Range Status   MRSA by PCR NEGATIVE  NEGATIVE Final   Comment:            The GeneXpert MRSA Assay (FDA  approved for NASAL specimens     only), is one component of a     comprehensive MRSA colonization     surveillance program. It is not     intended to diagnose MRSA     infection nor to guide or     monitor treatment for     MRSA infections.    Coagulation Studies: No results found for this basename: LABPROT, INR,  in the last 72 hours  Urinalysis: No results found for this basename: COLORURINE, APPERANCEUR, LABSPEC, PHURINE, GLUCOSEU, HGBUR, BILIRUBINUR, KETONESUR, PROTEINUR, UROBILINOGEN, NITRITE, LEUKOCYTESUR,  in the last 72 hours    Imaging: Dg Chest 2 View  03/21/2013   CLINICAL DATA:  Fever. Short of breath. Chest pain. Abdominal pain. Cough. Congestion.  EXAM: CHEST  2 VIEW  COMPARISON:  03/16/2013.  FINDINGS: Increasing bilateral interstitial and airspace opacity compared to the prior exam. The cardiopericardial silhouette is mildly enlarged for projection. Mediastinal contours appear within normal limits. Thickening of the fissures is present on the lateral view. The overall constellation of findings is most compatible with CHF/volume  overload with interstitial and alveolar pulmonary edema. Superimposed pneumonia is difficult to exclude but not favored. There is no gross pleural fusion. Cholecystectomy clips are present in the right upper quadrant.  IMPRESSION: Constellation of findings most compatible with moderate CHF/volume overload.   Electronically Signed   By: Andreas Newport M.D.   On: 03/21/2013 18:50     Medications:   . sodium chloride 10 mL/hr at 03/23/13 0826   . amLODipine  10 mg Oral QHS   And  . irbesartan  150 mg Oral QHS  . calcium gluconate  1 g Intravenous Once  . clonazePAM  1 mg Oral QHS  . gabapentin  100 mg Oral QHS  . insulin aspart  0-9 Units Subcutaneous Q4H  . metoprolol succinate  100 mg Oral QHS  . multivitamin  1 tablet Oral QHS  . pantoprazole (PROTONIX) IV  40 mg Intravenous QHS  . predniSONE  5 mg Oral Q breakfast  . sodium chloride  3 mL Intravenous Q12H  . tacrolimus  1 mg Oral BID   sodium chloride, acetaminophen, acetaminophen, albuterol, dextrose, diphenhydrAMINE, HYDROmorphone (DILAUDID) injection, ondansetron (ZOFRAN) IV, ondansetron, oxyCODONE, sodium chloride, zolpidem  Assessment/ Plan:  42 y/o female with ESRD, hx failed renal/pancreas tx d/c with abdominal pain/colitis. She had refused GI for sigmoidoscopy in past  and given her stability, she was d/c home and plans were for it to be done as an outpt. She missed dialysis Sat due to report BRBPR x 3 and worsening abdominal pain 10/25 and therefore came to the ED for evaluation and was subsequently admitted.   1. Abdominal pain/colitis - per primary; no BM since admission prob due to mycophenalate, have d/w transplant team at [facility] (Dr Mortimer Fries), they recommend stopping mycophenalate at this time, cont prograf and prednisone, shoot for prograf level of 4-5; have d/w pt who is agreeable, so will stop mycophenalate and order prograf level  2. ESRD with hx failed KP tx on low dose immunosuppression per [facility] MD; prograf,  prenidsone and cellcept;last dialysis was Sunday usually TTS 3. Anemia - with BRBPR reported prior to admission - Hemoglobin stable Appreciate GI input 4. Secondary hyperparathyroidism - recently outpt P had been low; cont hectorol; P 3.9 last admission off phoslo - since on CL now; will jut hold phoslo and follow conservatively  5. HTN/volume - back on usual meds  6. Nutrition - would advance diet  if no tests  7. DM - per primary some hypoglycemia no insulin recommended 8. 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 Edilia Bo last admission - needs elective repair after colitis resolved      LOS: 2 [name] W @TODAY @9 :08 AM

## 2013-03-23 NOTE — Progress Notes (Signed)
CBG: 16 Treatment: D50 IV 50 mL  Symptoms: Sweaty  Follow-up CBG: Time:853 CBG Result:125  Possible Reasons for Event: Inadequate meal intake  Comments/MD notified:mikhail

## 2013-03-23 NOTE — Progress Notes (Signed)
Pt. CBG = 26. Test repeated and was 24.  D50 given per Dr. Jack Quarto and ordered to give entire amp  50 ccs 25 gms.  Pt. Is very lethargic and hard to arouse.  Will respons and open eyes for very short period and then resumes sleep.  103/51 98% on 2 L O2 and hr 71 NSR.

## 2013-03-23 NOTE — Progress Notes (Signed)
Pt has completed half of bowel prep and instructed to call RN when pt has a bowel movement. Pt is asleep and arousable, however is unable to give clear verbal responses when asked questions. Pt asked if she has had a bowel movement. Per pt, "Yeah, I had one," then falls back asleep. RN asked if bowel movement was clear or brown. Per pt response, "Yeah." It is unclear if pt has had bowel movement and is non compliant with orders. Will continue to monitor. [name]

## 2013-03-23 NOTE — Progress Notes (Addendum)
Repeat cbg is 281. Craige Cotta NP notified

## 2013-03-23 NOTE — Progress Notes (Signed)
Pt had CBG this hs of 591. Pt asymptomatic. Craige Cotta NP notified. New orders to give 12 units novolog one time. Recheck results. Will continue to monitor. [name]

## 2013-03-23 NOTE — Progress Notes (Addendum)
Inpatient Diabetes Program Recommendations  AACE/ADA: New Consensus Statement on Inpatient Glycemic Control (2013)  Target Ranges:  Prepandial:   less than 140 mg/dL      Peak postprandial:   less than 180 mg/dL (1-2 hours)      Critically ill patients:  140 - 180 mg/dL     Results for [name], [name] (MRN [id number]) as of 03/23/2013 12:40  Ref. Range 03/21/2013 23:01 03/22/2013 04:07 03/22/2013 07:31 03/22/2013 11:41 03/22/2013 19:40  Glucose-Capillary Latest Range: 70-99 mg/dL 90 84 409 (H) 96 811 (H)    Results for [name], [name] (MRN [id number]) as of 03/23/2013 12:40  Ref. Range 03/23/2013 02:19 03/23/2013 04:59 03/23/2013 07:51 03/23/2013 08:53 03/23/2013 10:01 03/23/2013 10:08 03/23/2013 10:34 03/23/2013 11:24  Glucose-Capillary Latest Range: 70-99 mg/dL 213 (H) 086 (H) 16 (LL) 125 (H) 26 (LL) 24 (LL) 136 (H) 117 (H)    **Noted patient with severe hypoglycemia this morning (CBG 16 mg/dl at 8am and then 26 mg/dl at 57QI).    **Upon review of medication administration record, patient received three doses of Levemir total yesterday (10/26).  Patient received 7 units of Levemir at midnight, another 7 units of Levemir at 9am, and then another 7 units of Levemir at 9pm.  **Home med reconciliation is confusing.  One entry states patient takes Levemir 7 units bid at home.  Another entry states patient takes Levemir 5 units bid at home.  **Noted Levemir d/c'd altogether.  Concern for patient, as she has Type 1 DM and is completely insulin deficient.  She will need basal insulin added back to her regimen to prevent DKA.   Recommend the following:  Please add Levemir 7 units bid back to patient's in-hospital regimen   **Note patient was dismissed from her endocrinologist's practice this year (was seeing Dr. Carlus Pavlov with [facility]).  When speaking with patient today, patient also told me she was dismissed from Dr. Tonita Cong practice as well.  Patient clarified home Lantus  dose is 7 units bid.  Will follow. [name] RN, MSN, CDE Diabetes Coordinator Inpatient Diabetes Program Team Pager: [phone] (8a-10p)

## 2013-03-23 NOTE — Progress Notes (Signed)
[facility]                                                                                Patient Demographics  [name], is a 42 y.o. female, DOB - Apr 10, 1971, ZOX:[id number]  Admit date - 03/21/2013   Admitting Physician Ron Parker, MD  Outpatient Primary MD for the patient is [facility], [name] John, MD  LOS - 2   Chief Complaint  Patient presents with  . Abdominal Pain        Assessment & Plan    Principal Problem:   Rectal bleeding Active Problems:   Abdominal pain   DM (diabetes mellitus), type 1 with renal complications   Hyperlipidemia   Immunosuppression   Colitis   ESRD on hemodialysis  Rectal Bleeding -Possibe secondary to colitis, which may be secondary to cellcept. -Possible sigmoidoscopy was planned for today however canceled due to patient's hypoglycemia. Will likely be conducted tomorrow.  -Continue patient on clear liquids.  Hypoglycemia -Patient had a blood sugar to 16. She was given one amp of D50.  -Will discontinue her Lantus at this time.  -Continue her on sliding scale with CBG monitoring.  Anemia -Secondary to renal disease and rectal bleeding. -Patient currently hemodynamically stable. -Continue monitoring hemoglobin and hematocrit. Hemoglobin 10.6.  Intractable Abdominal pain, Acute on chronic -Continue pain control with IV Dilaudid -Likely secondary to colitis.  End stage renal disease, requiring hemodialysis -Nephrology consulted  Diabetes mellitus, Type 1 -Discontinue Lantus at this time. 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   Code Status: Full  Family Communication: None  Disposition Plan: SCDs   Procedures None  Consults   [facility]  DVT Prophylaxis SCDs  Lab Results  Component Value Date   PLT 160 03/22/2013     Medications  Scheduled Meds: . [MAR HOLD] amLODipine  10 mg Oral QHS   And  . [MAR HOLD] irbesartan  150 mg Oral QHS  . [facility] HOLD] calcium gluconate  1 g Intravenous Once  . [MAR HOLD] clonazePAM  1 mg Oral QHS  . [facility] HOLD] gabapentin  100 mg Oral QHS  . [MAR HOLD] insulin aspart  0-9 Units Subcutaneous Q4H  . [facility] HOLD] metoprolol succinate  100 mg Oral QHS  . [MAR HOLD] multivitamin  1 tablet Oral QHS  . [MAR HOLD] pantoprazole (PROTONIX) IV  40 mg Intravenous QHS  . [MAR HOLD] predniSONE  5 mg Oral Q breakfast  . [MAR HOLD] sodium chloride  3 mL Intravenous Q12H  . [facility] HOLD] tacrolimus  1 mg Oral BID   Continuous Infusions: . sodium chloride 10 mL/hr at 03/23/13 0826   PRN Meds:.[MAR HOLD] sodium chloride, [MAR HOLD] acetaminophen, [MAR HOLD] acetaminophen, [MAR HOLD] albuterol, [MAR HOLD] dextrose, [MAR HOLD] diphenhydrAMINE, [MAR HOLD]  HYDROmorphone (DILAUDID) injection, [MAR HOLD] ondansetron (ZOFRAN) IV, [MAR HOLD] ondansetron, [MAR HOLD] oxyCODONE, [MAR HOLD] sodium chloride, [MAR HOLD] zolpidem  Antibiotics   Anti-infectives   None     Time Spent in minutes   35 minutes   Ariannah Arenson D.O. on 03/23/2013 at 10:32 AM  Between 7am to 7pm - Pager - [phone]  After 7pm go to [email or web address] - password [email or web address]  And look for the night coverage person covering for me after hours  [facility] Office  [phone]    Subjective:   [name] seen and examined today.  Patient denies any abdominal pain at this time are very lethargic. Patient did have a blood sugar of 16 this morning. Patient denies dizziness, chest pain, shortness of breath, N/V/D/C, new weakness, numbess, tingling.    Objective:   Filed Vitals:   03/22/13 1907 03/22/13 1930 03/23/13 0512 03/23/13 0953  BP: 111/74 132/76 138/62 103/52  Pulse: 101 106 105 72  Temp: 98.2 F (36.8 C) 99.4 F (37.4 C) 98.4 F (36.9 C) 98.1 F (36.7 C)  TempSrc: Oral Oral Oral Oral  Resp:  16 16 18 12   Height:  5\' 1"  (1.549 m)    Weight: 69.3 kg (152 lb 12.5 oz) 69.29 kg (152 lb 12.1 oz)    SpO2: 100% 98% 95% 100%    Wt Readings from Last 3 Encounters:  03/22/13 69.29 kg (152 lb 12.1 oz)  03/22/13 69.29 kg (152 lb 12.1 oz)  03/19/13 68.7 kg (151 lb 7.3 oz)     Intake/Output Summary (Last 24 hours) at 03/23/13 1032 Last data filed at 03/23/13 0513  Gross per 24 hour  Intake    240 ml  Output   3000 ml  Net  -2760 ml    Exam  General: Well developed, well nourished, NAD, appears stated age, hypersomnolent  HEENT: NCAT, PERRLA, EOMI, Anicteic Sclera, mucous membranes moist. No pharyngeal erythema or exudates  Neck: Supple, no JVD, no masses  Cardiovascular: S1 S2 auscultated, no rubs, murmurs or gallops. Regular rate and rhythm.  Respiratory: Clear to auscultation bilaterally with equal chest rise  Abdomen: Soft, obese, tender, nondistended, + bowel sounds  Extremities: warm dry without cyanosis clubbing or edema  Neuro: AAOx3, cranial nerves grossly intact.   Skin: Without rashes exudates or nodules  Data Review   Micro Results Recent Results (from the past 240 hour(s))  MRSA PCR SCREENING     Status: None   Collection Time    03/16/13  9:47 PM      Result Value Range Status   MRSA by PCR NEGATIVE  NEGATIVE Final   Comment:            The GeneXpert MRSA Assay (FDA     approved for NASAL specimens     only), is one component of a     comprehensive MRSA colonization     surveillance program. It is not     intended to diagnose MRSA     infection nor to guide or     monitor treatment for     MRSA infections.    Radiology Reports Dg Chest 2 View  03/21/2013   CLINICAL DATA:  Fever. Short of breath. Chest pain. Abdominal pain. Cough. Congestion.  EXAM: CHEST  2 VIEW  COMPARISON:  03/16/2013.  FINDINGS: Increasing bilateral interstitial and airspace opacity compared to the prior exam. The cardiopericardial silhouette is mildly enlarged for  projection. Mediastinal contours appear within normal limits. Thickening of the fissures is present on the lateral view. The overall constellation of findings is most compatible with CHF/volume overload with interstitial and alveolar pulmonary edema. Superimposed pneumonia is difficult to exclude but not favored. There is no gross pleural fusion. Cholecystectomy clips are present in the right upper quadrant.  IMPRESSION: Constellation of findings  most compatible with moderate CHF/volume overload.   Electronically Signed   By: Andreas Newport M.D.   On: 03/21/2013 18:50   Dg Chest 2 View  03/16/2013   CLINICAL DATA:  Shortness of breath  EXAM: CHEST  2 VIEW  COMPARISON:  02/19/2013  FINDINGS: The cardio pericardial silhouette is enlarged. There is right base atelectasis or infiltrate. Small right pleural effusion associated. Imaged bony structures of the thorax are intact.  IMPRESSION: Right base atelectasis or pneumonia with small right pleural effusion.   Electronically Signed   By: Kennith Center M.D.   On: 03/16/2013 17:34   Ct Abdomen Pelvis W Contrast  03/16/2013   CLINICAL DATA:  Diffuse stabbing abdominal pain with nausea and vomiting for 2 days, chronic dialysis  EXAM: CT ABDOMEN AND PELVIS WITH CONTRAST  TECHNIQUE: Multidetector CT imaging of the abdomen and pelvis was performed using the standard protocol following bolus administration of intravenous contrast.  CONTRAST:  OMNIPAQUE IOHEXOL 300 MG/ML  SOLN  COMPARISON:  06/10/2012  FINDINGS: There is mild scattered bilateral hazy ground-glass attenuation with more focal dependent atelectasis bilaterally. There is a tiny left and small to moderate right pleural effusion.  There are no focal hepatic abnormalities. There is mild periportal edema. The spleen, pancreas, and adrenal glands are normal. There is severe bilateral renal atrophy and cystic change. 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 There is a  nonobstructive bowel gas pattern with mild diffuse colon wall thickening. There is also mild infiltration of the omentum and inflammatory change in the adjacent fat. These findings are similar to the prior study. The appendix is normal. Reproductive organs and bladder are normal. There is a small volume of ascites. Small transplanted kidney left flank unchanged. There are no acute musculoskeletal findings.  IMPRESSION: Diffuse colon wall thickening with mild infiltration of the omentum. The findings again suggest a diffuse colitis.   Electronically Signed   By: Esperanza Heir M.D.   On: 03/16/2013 17:45   Nm Pulmonary Perf And Vent  03/16/2013   CLINICAL DATA:  Shortness of breath  EXAM: NUCLEAR MEDICINE VENTILATION - PERFUSION LUNG SCAN  TECHNIQUE: Ventilation images were obtained in multiple projections using inhaled aerosol technetium 99 M DTPA. Perfusion images were obtained in multiple projections after intravenous injection of Tc-57m MAA.  COMPARISON:  02/27/2011.  RADIOPHARMACEUTICALS:  40 mCi Tc-68m DTPA aerosol and 6 mCi Tc-30m MAA  FINDINGS: Ventilation: No focal ventilation defect.  Perfusion: No wedge shaped peripheral perfusion defects to suggest acute pulmonary embolism.  IMPRESSION: Very low probability for pulmonary embolus. .   Electronically Signed   By: Kennith Center M.D.   On: 03/16/2013 17:21    CBC  Recent Labs Lab 03/16/13 1145 03/17/13 0400 03/17/13 2300 03/19/13 0753 03/21/13 1819 03/22/13 1200 03/22/13 2055 03/23/13 0618  WBC 5.5 6.9 5.4 4.9 8.1 5.4  --   --   HGB 10.2* 10.0* 9.9* 10.1* 11.9* 10.3* 10.7* 10.6*  HCT 34.3* 30.4* 30.4* 31.5* 37.1 31.5* 33.4* 33.3*  PLT 164 169 139* 156 134* 160  --   --   MCV 112.5* 101.7* 103.1* 105.4* 106.0* 104.7*  --   --   MCH 33.4 33.4 33.6 33.8 34.0 34.2*  --   --   MCHC 29.7* 32.9 32.6 32.1 32.1 32.7  --   --   RDW 17.1* 16.4* 17.1* 18.0* 17.2* 16.8*  --   --   LYMPHSABS 0.6*  --   --   --  1.1  --   --   --   MONOABS 0.2   --   --   --  0.4  --   --   --   EOSABS 0.1  --   --   --  0.1  --   --   --   BASOSABS 0.0  --   --   --  0.0  --   --   --     Chemistries   Recent Labs Lab 03/16/13 1145  03/17/13 2300 03/19/13 0753 03/21/13 1819 03/22/13 1200 03/23/13 0618 03/23/13 0858  NA 120*  < > 136 138 133* 132* 136  --   K 5.7*  < > 3.3* 3.9 5.8* 6.5* 4.9 3.8  CL 81*  < > 95* 100 98 98 97  --   CO2 18*  < > 21 23 20 20 25   --   GLUCOSE 1076*  < > 199* 47* 122* 93 87  --   BUN 52*  < > 25* 37* 29* 38* 14  --   CREATININE 8.29*  < > 5.55* 8.31* 7.62* 9.46* 5.74*  --   CALCIUM 7.7*  < > 7.9* 7.5* 8.3* 8.1* 8.1*  --   AST 44*  --   --   --  33  --   --   --   ALT 27  --   --   --  17  --   --   --   ALKPHOS 156*  --   --   --  125*  --   --   --   BILITOT 0.8  --   --   --  0.8  --   --   --   < > = values in this interval not displayed. ------------------------------------------------------------------------------------------------------------------ estimated creatinine clearance is 11.6 ml/min (by C-G formula based on Cr of 5.74). ------------------------------------------------------------------------------------------------------------------ No results found for this basename: HGBA1C,  in the last 72 hours ------------------------------------------------------------------------------------------------------------------ No results found for this basename: CHOL, HDL, LDLCALC, TRIG, CHOLHDL, LDLDIRECT,  in the last 72 hours ------------------------------------------------------------------------------------------------------------------ No results found for this basename: TSH, T4TOTAL, FREET3, T3FREE, THYROIDAB,  in the last 72 hours ------------------------------------------------------------------------------------------------------------------ No results found for this basename: VITAMINB12, FOLATE, FERRITIN, TIBC, IRON, RETICCTPCT,  in the last 72 hours  Coagulation profile No results found for  this basename: INR, PROTIME,  in the last 168 hours  No results found for this basename: DDIMER,  in the last 72 hours  Cardiac Enzymes  Recent Labs Lab 03/16/13 1145  TROPONINI <0.30   ------------------------------------------------------------------------------------------------------------------ No components found with this basename: POCBNP,

## 2013-03-24 ENCOUNTER — Encounter (HOSPITAL_COMMUNITY)
Admission: EM | Disposition: A | Discharge status: Home / Self Care | Payer: Self-pay | Source: Home / Self Care | Attending: Internal Medicine

## 2013-03-24 ENCOUNTER — Encounter (HOSPITAL_COMMUNITY): Payer: Self-pay

## 2013-03-24 HISTORY — PX: FLEXIBLE SIGMOIDOSCOPY: SHX5431

## 2013-03-24 LAB — CBC
HCT: 29.9 % — ABNORMAL LOW (ref 36.0–46.0)
Hemoglobin: 9.7 g/dL — ABNORMAL LOW (ref 12.0–15.0)
RDW: 16.3 % — ABNORMAL HIGH (ref 11.5–15.5)
WBC: 4.2 10*3/uL (ref 4.0–10.5)

## 2013-03-24 LAB — GLUCOSE, CAPILLARY
Glucose-Capillary: 118 mg/dL — ABNORMAL HIGH (ref 70–99)
Glucose-Capillary: 176 mg/dL — ABNORMAL HIGH (ref 70–99)
Glucose-Capillary: 24 mg/dL — CL (ref 70–99)
Glucose-Capillary: 27 mg/dL — CL (ref 70–99)
Glucose-Capillary: 165 mg/dL — ABNORMAL HIGH (ref 70–99)
Glucose-Capillary: 109 mg/dL — ABNORMAL HIGH (ref 70–99)

## 2013-03-24 LAB — RENAL FUNCTION PANEL
Albumin: 2.9 g/dL — ABNORMAL LOW (ref 3.5–5.2)
BUN: 26 mg/dL — ABNORMAL HIGH (ref 6–23)
Calcium: 6.8 mg/dL — ABNORMAL LOW (ref 8.4–10.5)
Chloride: 94 mEq/L — ABNORMAL LOW (ref 96–112)
GFR calc Af Amer: 7 mL/min — ABNORMAL LOW (ref >90)
GFR calc non Af Amer: 6 mL/min — ABNORMAL LOW (ref >90)
Glucose, Bld: 196 mg/dL — ABNORMAL HIGH (ref 70–99)
Potassium: 4.3 mEq/L (ref 3.5–5.1)
Sodium: 131 mEq/L — ABNORMAL LOW (ref 135–145)

## 2013-03-24 SURGERY — SIGMOIDOSCOPY, FLEXIBLE
Anesthesia: Moderate Sedation

## 2013-03-24 MED ORDER — INSULIN ASPART 100 UNIT/ML ~~LOC~~ SOLN
1.0000 [IU] | Freq: Three times a day (TID) | SUBCUTANEOUS | Status: DC
  Administered 2013-03-24: 3 [IU] via SUBCUTANEOUS
  Administered 2013-03-25: 2 [IU] via SUBCUTANEOUS
  Administered 2013-03-26: 3 [IU] via SUBCUTANEOUS
  Administered 2013-03-26: 1 [IU] via SUBCUTANEOUS
  Administered 2013-03-27 (×2): 4 [IU] via SUBCUTANEOUS

## 2013-03-24 MED ORDER — PENTAFLUOROPROP-TETRAFLUOROETH EX AERO: 1.0000 "application " | INHALATION_SPRAY | CUTANEOUS | Status: DC | PRN

## 2013-03-24 MED ORDER — INSULIN GLARGINE 100 UNIT/ML ~~LOC~~ SOLN
5.0000 [IU] | Freq: Two times a day (BID) | SUBCUTANEOUS | Status: DC
  Administered 2013-03-24 – 2013-03-26 (×5): 5 [IU] via SUBCUTANEOUS
  Filled 2013-03-24 (×8): qty 0.05

## 2013-03-24 MED ORDER — SACCHAROMYCES BOULARDII 250 MG PO CAPS
250.0000 mg | ORAL_CAPSULE | Freq: Two times a day (BID) | ORAL | Status: DC
  Administered 2013-03-24 – 2013-03-28 (×9): 250 mg via ORAL
  Filled 2013-03-24 (×11): qty 1

## 2013-03-24 MED ORDER — MIDAZOLAM HCL 5 MG/ML IJ SOLN
INTRAMUSCULAR | Status: AC
  Filled 2013-03-24: qty 2

## 2013-03-24 MED ORDER — HEPARIN SODIUM (PORCINE) 1000 UNIT/ML DIALYSIS: 1000.0000 [IU] | INTRAMUSCULAR | Status: DC | PRN

## 2013-03-24 MED ORDER — LIDOCAINE-PRILOCAINE 2.5-2.5 % EX CREA
1.0000 "application " | TOPICAL_CREAM | CUTANEOUS | Status: DC | PRN
  Filled 2013-03-24: qty 5

## 2013-03-24 MED ORDER — GLUCAGON HCL (RDNA) 1 MG IJ SOLR: 1.0000 mg | Freq: Once | INTRAMUSCULAR | Status: AC | PRN

## 2013-03-24 MED ORDER — ONDANSETRON HCL 4 MG/2ML IJ SOLN
INTRAMUSCULAR | Status: AC
  Administered 2013-03-24: 4 mg via INTRAVENOUS
  Filled 2013-03-24: qty 2

## 2013-03-24 MED ORDER — ALTEPLASE 2 MG IJ SOLR
2.0000 mg | Freq: Once | INTRAMUSCULAR | Status: DC | PRN
  Filled 2013-03-24: qty 2

## 2013-03-24 MED ORDER — GLUCAGON HCL (RDNA) 1 MG IJ SOLR: 0.5000 mg | Freq: Once | INTRAMUSCULAR | Status: AC | PRN

## 2013-03-24 MED ORDER — NEPRO/CARBSTEADY PO LIQD
237.0000 mL | ORAL | Status: DC | PRN
  Filled 2013-03-24: qty 237

## 2013-03-24 MED ORDER — SODIUM CHLORIDE 0.9 % IV SOLN: 100.0000 mL | INTRAVENOUS | Status: DC | PRN

## 2013-03-24 MED ORDER — LIDOCAINE HCL (PF) 1 % IJ SOLN: 5.0000 mL | INTRAMUSCULAR | Status: DC | PRN

## 2013-03-24 MED ORDER — SODIUM CHLORIDE 0.9 % IV BOLUS (SEPSIS)
500.0000 mL | Freq: Once | INTRAVENOUS | Status: AC
Start: 2013-03-24 — End: 2013-03-25
  Administered 2013-03-24: 500 mL via INTRAVENOUS

## 2013-03-24 MED ORDER — GLUCAGON HCL (RDNA) 1 MG IJ SOLR
INTRAMUSCULAR | Status: AC
  Administered 2013-03-24: 1 mg
  Filled 2013-03-24: qty 1

## 2013-03-24 MED ORDER — FENTANYL CITRATE 0.05 MG/ML IJ SOLN
INTRAMUSCULAR | Status: AC
  Filled 2013-03-24: qty 2

## 2013-03-24 NOTE — Interval H&P Note (Signed)
History and Physical Interval Note:  03/24/2013 8:48 AM  [name]  has presented today for surgery, with the diagnosis of diarhea  The various methods of treatment have been discussed with the patient. After consideration of risks, benefits and other options for treatment, the patient has consented to  Procedure(s): FLEXIBLE SIGMOIDOSCOPY (N/A) as a surgical intervention .  The patient's history has been reviewed, patient examined, no change in status, stable for surgery.  I have reviewed the patient's chart and labs.  Questions were answered to the patient's satisfaction.     Florencia Reasons

## 2013-03-24 NOTE — Progress Notes (Addendum)
Inpatient Diabetes Program Recommendations  AACE/ADA: New Consensus Statement on Inpatient Glycemic Control (2013)  Target Ranges:  Prepandial:   less than 140 mg/dL      Peak postprandial:   less than 180 mg/dL (1-2 hours)      Critically ill patients:  140 - 180 mg/dL     Results for [name], [name] (MRN [id number]) as of 03/24/2013 10:28  Ref. Range 03/23/2013 02:19 03/23/2013 04:59 03/23/2013 07:51 03/23/2013 08:53 03/23/2013 10:01 03/23/2013 10:08 03/23/2013 10:34 03/23/2013 11:24 03/23/2013 14:52 03/23/2013 16:55 03/23/2013 20:12 03/23/2013 21:32 03/23/2013 22:20  Glucose-Capillary Latest Range: 70-99 mg/dL 409 (H) 811 (H) 16 (LL) 125 (H) 26 (LL) 24 (LL) 136 (H) 117 (H) 307 (H) 438 (H) 591 (HH) 281 (H) 127 (H)    Results for [name], [name] (MRN [id number]) as of 03/24/2013 10:28  Ref. Range 03/23/2013 23:48 03/24/2013 00:17 03/24/2013 01:06 03/24/2013 01:18 03/24/2013 04:12 03/24/2013 07:49  Glucose-Capillary Latest Range: 70-99 mg/dL 24 (LL) 27 (LL) 63 (L) 118 (H) 176 (H) 165 (H)    **Noted patient had severe hypoglycemia yesterday morning (CBG 16 mg/dl at 8am and then 26 mg/dl at 21HY).   **Upon review of medication administration record, patient received three doses of Levemir total on 10/26. Patient received 7 units of Levemir at midnight, another 7 units of Levemir at 9am, and then another 7 units of Levemir at 9pm.    **One dose of basal insulin given yesterday (10/27)- Lantus 5 units at 1832.  At 1700, patient's CBG was 438 mg/dl.  Patient was given 10 units of Novolog.  Then at 2000 last night, patient's CBG was 591 mg/dl and patient received another 12 units of Novolog.  As a result of receiving such large doses of Novolog back to back, patient's CBG dropped to 24 mg/dl by midnight.  I do not think the Lantus caused the hypoglycemia at midnight.  This patient does not do well with large doses of Novolog.    **Patient will need basal insulin added back to her regimen to  prevent DKA as she has Type 1 diabetes and is completely insulin deficient.   Recommend the following to try to prevent such wide fluctuations of glucose levels:  1. Add back a lower dose of patient's home basal insulin- Lantus 5 units bid (start now)  2. Add Novolog meal coverage- 3 units tid with meals (give only if patient eats 50% or more of meal)  3. D/C Novolog Sensitive SSI Q4 hours  4. Start Custom SSI with Novolog tid with meals  150-200  1 unit  201-250  2 units  251-300  3 units  301-350  4 units  351-400  5 units  >400  Call MD  Will follow. [name] RN, MSN, CDE Diabetes Coordinator Inpatient Diabetes Program Team Pager: [phone] Addendum:  Spoke with Dr. [name] via phone.  Orders given to this DM Coordinator to restart Lantus 5 units bid (start now) and to adjust SSI to the following:  151-200  1 unit  201-250  2 units  251-300  3 units  301-350  4 units  351-400  5 units  >400  Call MD  Called RN [name] and asked her to please tell patient's RN [name]) that new insulin orders have been placed today.

## 2013-03-24 NOTE — Progress Notes (Signed)
Hypoglycemic Event  CBG: 24  Treatment: 15 GM carbohydrate snack  Symptoms: lethagic  Follow-up CBG:  CBG Result: 27  Possible Reasons for Event: Unknown  Hypoglycemic Event  CBG: 27  Treatment: Glucagon IM 1 mg  Symptoms: Lethargic  Follow-up CBG: Time: 115 CBG Result: 118  Possible Reasons for Event: Unknown   [name]  Remember to initiate Hypoglycemia Order Set & complete

## 2013-03-24 NOTE — Progress Notes (Signed)
Pt has bp of 83/46 this evening. Per pt, "I don't feel well and everything just hurts more than usual." Craige Cotta NP notified. Awaiting call back .[name]

## 2013-03-24 NOTE — Op Note (Signed)
[facility] [address] [place], [address]   FLEXIBLE SIGMOIDOSCOPY PROCEDURE REPORT  PATIENT: [name], [name]  MR#: [id number] BIRTHDATE: 05/08/1971 , 42  yrs. old GENDER: Female ENDOSCOPIST: Bernette Redbird, MD REFERRED BY:  unassigned (followed by [facility]; primary physician is in [place]) PROCEDURE DATE:  03/24/2013 PROCEDURE:     flexible sigmoidoscopy with biopsies ASA CLASS: INDICATIONS: diarrhea, rectal bleeding, abdominal pain, questionable mild colitis on CT scan MEDICATIONS:    none  DESCRIPTION OF PROCEDURE:   the patient provided written consent and was brought from her hospital room to the [facility] cone endoscopy unit. This procedure was done unsedated. She had taken a MiraLAX colonoscopy prep a couple of nights ago but no additional prep was administered for this procedure.  Perianal exam was unremarkable. The Pentax pediatric video colonoscope was inserted and advanced to about 30 cm, and pullback was then performed. The quality of the prep was excellent, with just a small amount of mucoid residue in the colon.  There was no blood whatsoever in the colonic lumen.  The mucosa of the sigmoid appeared slightly edematous, as characterized by the absence of vascular markings. However, there was no erythema, nor were there any other darker some inflammation such as friability, exudate, granularity, pseudomembranes etc. Random mucosal biopsies were obtained.  During pullback, I did not encounter any polyps, masses, or diverticular change.  The rectum had a normal appearance, with preserved vascular markings. Retroflexion was normal, showing minimal internal hemorrhoids. Random biopsies of the rectum were obtained and placed in a separate jar.  The patient tolerated the procedure very well.        COMPLICATIONS:  None  ENDOSCOPIC IMPRESSION:  1. No evident blood within the colonic lumen despite  patient stated history of bloody bowel movements 2. Possible minimal mucosal edema in the sigmoid region; no overt colitis or proctitis endoscopically evident 3. Otherwise normal exam up to 30 cm  RECOMMENDATIONS:  1. Okay to advance diet. Patient is asking for food 2. Await pathology results on biopsies. No immediate treatment or action needed based on current endoscopic findings   _______________________________ eSigned:  Bernette Redbird, MD 03/24/2013 9:24 AM   PATIENT NAME:  [name] MR#: [id number]

## 2013-03-24 NOTE — Progress Notes (Signed)
Patient had flexible sigmoidoscopy today. Please see full dictated report for details.  Basically, the exam was normal. There was some questionable minimal edema of the mucosa in the sigmoid region, but no overt colitis. Random mucosal biopsies, looking for microscopic colitis, are pending.  Although the patient states that she has had blood in her bowel movements, I am not seeing any blood in the colonic lumen at this time. She had very mild internal hemorrhoids.  I have started her back on a solid (renal) diet, in view of her negative findings and the fact that she is hungry and asking for food.  I have also started her on probiotic therapy since I believe she was exposed to some antibiotics earlier in her hospitalization, and simply by virtue of her being immunosuppressed and in the hospital, I think prophylaxis against C. difficile colitis is prudent. It may also help with her diarrhea.  The patient stated to me that her family wants her to have a full colonoscopy. She has not had occult blood testing while in the hospital, nor will I order Hemoccults at this time in view of her random biopsies in the rectum and sigmoid today. However, I do not see a compelling reason to do a colonoscopy at this time in this patient, and I would prefer to spare her the risk of deep sedation which would probably be needed for that exam.  I would favor Hemoccult testing as an outpatient through her primary physician's office, with consideration for colonoscopy if she has ongoing GI symptoms or Hemoccult positivity.  Florencia Reasons, M.D. [phone]

## 2013-03-24 NOTE — Progress Notes (Signed)
[facility] ROUNDING NOTE   Subjective:   Interval History:eating dinner  Objective:  Vital signs in last 24 hours:  Temp:  [97.5 F (36.4 C)-98.3 F (36.8 C)] 98 F (36.7 C) (10/28 0809) Pulse Rate:  [63-81] 63 (10/28 0431) Resp:  [10-19] 12 (10/28 0914) BP: (87-128)/(48-75) 101/53 mmHg (10/28 1140) SpO2:  [96 %-100 %] 100 % (10/28 0914) Weight:  [69.29 kg (152 lb 12.1 oz)] 69.29 kg (152 lb 12.1 oz) (10/27 2013)  Weight change: -2.41 kg (-5 lb 5 oz) Filed Weights   03/22/13 1907 03/22/13 1930 03/23/13 2013  Weight: 69.3 kg (152 lb 12.5 oz) 69.29 kg (152 lb 12.1 oz) 69.29 kg (152 lb 12.1 oz)    Intake/Output: I/O last 3 completed shifts: In: 716 [P.O.:716] Out: 3000 [Other:3000]   Intake/Output this shift:     CVS- RRR RS- CTA ABD- BS present soft non-distended EXT- no edema   Basic Metabolic Panel:  Recent Labs Lab 03/17/13 2300 03/19/13 0753 03/21/13 1819 03/22/13 1200 03/23/13 0618 03/23/13 0858  NA 136 138 133* 132* 136  --   K 3.3* 3.9 5.8* 6.5* 4.9 3.8  CL 95* 100 98 98 97  --   CO2 21 23 20 20 25   --   GLUCOSE 199* 47* 122* 93 87  --   BUN 25* 37* 29* 38* 14  --   CREATININE 5.55* 8.31* 7.62* 9.46* 5.74*  --   CALCIUM 7.9* 7.5* 8.3* 8.1* 8.1*  --   PHOS  --  3.9  --   --   --   --     Liver Function Tests:  Recent Labs Lab 03/19/13 0753 03/21/13 1819  AST  --  33  ALT  --  17  ALKPHOS  --  125*  BILITOT  --  0.8  PROT  --  6.9  ALBUMIN 3.1* 3.3*    Recent Labs Lab 03/21/13 1819  LIPASE 45   No results found for this basename: AMMONIA,  in the last 168 hours  CBC:  Recent Labs Lab 03/17/13 2300 03/19/13 0753 03/21/13 1819 03/22/13 1200 03/22/13 2055 03/23/13 0618 03/23/13 1925  WBC 5.4 4.9 8.1 5.4  --   --   --   NEUTROABS  --   --  6.4  --   --   --   --   HGB 9.9* 10.1* 11.9* 10.3* 10.7* 10.6* 9.4*  HCT 30.4* 31.5* 37.1 31.5* 33.4* 33.3* 30.3*  MCV 103.1* 105.4* 106.0* 104.7*  --   --   --   PLT  139* 156 134* 160  --   --   --     Cardiac Enzymes: No results found for this basename: CKTOTAL, CKMB, CKMBINDEX, TROPONINI,  in the last 168 hours  BNP: No components found with this basename: POCBNP,   CBG:  Recent Labs Lab 03/24/13 0017 03/24/13 0106 03/24/13 0118 03/24/13 0412 03/24/13 0749  GLUCAP 27* 63* 118* 176* 165*    Microbiology: Results for orders placed during the hospital encounter of 03/16/13  MRSA PCR SCREENING     Status: None   Collection Time    03/16/13  9:47 PM      Result Value Range Status   MRSA by PCR NEGATIVE  NEGATIVE Final   Comment:            The GeneXpert MRSA Assay (FDA     approved for NASAL specimens     only), is one component of a  comprehensive MRSA colonization     surveillance program. It is not     intended to diagnose MRSA     infection nor to guide or     monitor treatment for     MRSA infections.    Coagulation Studies: No results found for this basename: LABPROT, INR,  in the last 72 hours  Urinalysis: No results found for this basename: COLORURINE, APPERANCEUR, LABSPEC, PHURINE, GLUCOSEU, HGBUR, BILIRUBINUR, KETONESUR, PROTEINUR, UROBILINOGEN, NITRITE, LEUKOCYTESUR,  in the last 72 hours    Imaging: Ct Head Wo Contrast  03/23/2013   CLINICAL DATA:  Lethargy.  EXAM: CT HEAD WITHOUT CONTRAST  TECHNIQUE: Contiguous axial images were obtained from the base of the skull through the vertex without intravenous contrast.  COMPARISON:  01/04/2013  FINDINGS: No mass lesion. No midline shift. No acute hemorrhage or hematoma. No extra-axial fluid collections. No evidence of acute infarction. There benign calcifications in the basal ganglia and cerebellar hemispheres, stable. Tiny old lacunar infarct in left basal ganglia, unchanged. No osseous abnormality.  IMPRESSION: No acute abnormalities.   Electronically Signed   By: Geanie Cooley M.D.   On: 03/23/2013 15:11     Medications:   . sodium chloride 10 mL/hr at 03/23/13 0826    . amLODipine  10 mg Oral QHS   And  . irbesartan  150 mg Oral QHS  . clonazePAM  1 mg Oral QHS  . dextrose      . feeding supplement (RESOURCE BREEZE)  1 Container Oral BID BM  . gabapentin  100 mg Oral QHS  . insulin aspart  0-9 Units Subcutaneous Q4H  . metoprolol succinate  100 mg Oral QHS  . multivitamin  1 tablet Oral QHS  . pantoprazole (PROTONIX) IV  40 mg Intravenous QHS  . predniSONE  5 mg Oral Q breakfast  . saccharomyces boulardii  250 mg Oral BID  . sodium chloride  3 mL Intravenous Q12H  . tacrolimus  1 mg Oral BID   sodium chloride, acetaminophen, acetaminophen, albuterol, dextrose, diphenhydrAMINE, glucagon, glucagon, HYDROmorphone (DILAUDID) injection, ondansetron (ZOFRAN) IV, ondansetron, oxyCODONE, sodium chloride, zolpidem  Assessment/ Plan:  42 year old female with a history of diabetes,renal disease requiring hemodialysis, that was recently admitted and discharged for rectal bleeding normal sigmoidoscopy  1. Abdominal pain/colitis - per primary; no BM since admission prob due to mycophenalate, have d/w transplant team at [facility] (Dr Mortimer Fries), they recommend stopping mycophenalate at this time, cont prograf and prednisone, shoot for prograf level of 4-5; have d/w pt who is agreeable, so will stop mycophenalate  2. ESRD with hx failed KP tx on low dose immunosuppression per [facility] MD; prograf, prenidsone and cellcept;last dialysis was Sunday usually TTS   Dialysis today 3. Anemia - with BRBPR reported prior to admission - Hemoglobin stable Appreciate GI input  4. Secondary hyperparathyroidism - recently outpt P had been low; cont hectorol; P 3.9 last admission off phoslo - since on CL now; will jut hold phoslo and follow conservatively  5. HTN/volume - back on usual meds  6. Nutrition - would advance diet if no tests  7. DM - per primary some hypoglycemia no insulin recommended  8. 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 Edilia Bo last admission -  needs elective repair after colitis resolved     LOS: 3 Ethan Clayburn W @TODAY @12 :12 PM

## 2013-03-24 NOTE — Progress Notes (Signed)
[facility]                                                                                Patient Demographics  [name], is a 42 y.o. female, DOB - [birth year], ZOX:[id number]  Admit date - 03/21/2013   Admitting Physician Ron Parker, MD  Outpatient Primary MD for the patient is [facility], [name], MD  LOS - 3   Chief Complaint  Patient presents with  . Abdominal Pain      Interim history This is a 42 year old female with a history of diabetes, incisional disease requiring hemodialysis, that was recently admitted and discharged for rectal bleeding. Patient had refused using enemas on her previous admissions was discharged home with outpatient follow up flexible sigmoidoscopy. Her abdominal pain worsened with rectal bleeding so she decided to come back to the hospital. Patient underwent flex sigmoidoscopy today with biopsy. She continues to have some bright red blood per rectum. Will continue to monitor patient patient is also had very labile blood sugars.  Assessment & Plan    Principal Problem:   Rectal bleeding Active Problems:   Abdominal pain   DM (diabetes mellitus), type 1 with renal complications   Hyperlipidemia   Immunosuppression   Colitis   ESRD on hemodialysis  Rectal Bleeding -Possibe secondary to colitis, which may be secondary to cellcept. -Possible sigmoidoscopy was planned for today however canceled due to patient's hypoglycemia. Will likely be conducted tomorrow.  -Continue patient on clear liquids.  Anemia -Secondary to renal disease and rectal bleeding. -Patient currently hemodynamically stable. -Continue monitoring hemoglobin and hematocrit. H/H 9.4/30.3  Intractable Abdominal pain, Acute on chronic -Continue pain control with IV Dilaudid -Likely secondary to colitis.  End stage renal disease, requiring hemodialysis -Nephrology consulted  Hypoglycemia with Diabetes mellitus, Type 1 -Discontinue Lantus at this time.  Will continue insulin sliding scale with CBG.  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   Code Status: Full  Family Communication: None  Disposition Plan: SCDs   Procedures None  Consults   [facility]  DVT Prophylaxis SCDs  Lab Results  Component Value Date   PLT 160 03/22/2013    Medications  Scheduled Meds: . amLODipine  10 mg Oral QHS   And  . irbesartan  150 mg Oral QHS  . clonazePAM  1 mg Oral QHS  . dextrose      . dextrose      . feeding supplement (RESOURCE BREEZE)  1 Container Oral BID BM  . gabapentin  100 mg Oral QHS  . insulin aspart  0-9 Units Subcutaneous Q4H  . metoprolol succinate  100 mg Oral QHS  . multivitamin  1 tablet Oral QHS  . pantoprazole (PROTONIX) IV  40 mg Intravenous QHS  . predniSONE  5 mg Oral Q breakfast  . saccharomyces boulardii  250 mg Oral BID  . sodium chloride  3 mL Intravenous Q12H  . tacrolimus  1 mg Oral BID   Continuous Infusions: . sodium chloride 10 mL/hr at 03/23/13 0826   PRN Meds:.sodium chloride, acetaminophen, acetaminophen, albuterol, dextrose, diphenhydrAMINE, glucagon, glucagon, HYDROmorphone (DILAUDID) injection, ondansetron (ZOFRAN)  IV, ondansetron, oxyCODONE, sodium chloride, zolpidem  Antibiotics   Anti-infectives   None     Time Spent in minutes   30 minutes   Dylann Gallier D.O. on 03/24/2013 at 11:36 AM  Between 7am to 7pm - Pager - [phone]  After 7pm go to [email or web address] - password [email or web address]  And look for the night coverage person covering for me after hours  [facility] Office  [phone]    Subjective:   [name] seen and examined today.  Patient denies any pain at this time her abdominal pain has improved slightly. She continues to have bright red blood per rectum. Patient denies dizziness, chest pain, shortness of breath, N/V/D/C, new weakness, numbess,  tingling.    Objective:   Filed Vitals:   03/24/13 0850 03/24/13 0855 03/24/13 0900 03/24/13 0914  BP: 87/62 93/48 100/53 96/61  Pulse:      Temp:      TempSrc:      Resp: 18 13  12   Height:      Weight:      SpO2: 100% 96% 100% 100%    Wt Readings from Last 3 Encounters:  03/23/13 69.29 kg (152 lb 12.1 oz)  03/23/13 69.29 kg (152 lb 12.1 oz)  03/19/13 68.7 kg (151 lb 7.3 oz)     Intake/Output Summary (Last 24 hours) at 03/24/13 1136 Last data filed at 03/23/13 2014  Gross per 24 hour  Intake    240 ml  Output      0 ml  Net    240 ml    Exam  General: Well developed, well nourished, NAD, appears stated age  HEENT: NCAT, PERRLA, EOMI, Anicteic Sclera, mucous membranes moist. No pharyngeal erythema or exudates  Neck: Supple, no JVD, no masses  Cardiovascular: S1 S2 auscultated, no rubs, murmurs or gallops. Regular rate and rhythm.  Respiratory: Clear to auscultation bilaterally with equal chest rise  Abdomen: Soft, obese, tender, nondistended, + bowel sounds  Extremities: warm dry without cyanosis clubbing or edema  Neuro: AAOx3, cranial nerves grossly intact.   Skin: Without rashes exudates or nodules  Data Review   Micro Results Recent Results (from the past 240 hour(s))  MRSA PCR SCREENING     Status: None   Collection Time    03/16/13  9:47 PM      Result Value Range Status   MRSA by PCR NEGATIVE  NEGATIVE Final   Comment:            The GeneXpert MRSA Assay (FDA     approved for NASAL specimens     only), is one component of a     comprehensive MRSA colonization     surveillance program. It is not     intended to diagnose MRSA     infection nor to guide or     monitor treatment for     MRSA infections.    Radiology Reports Dg Chest 2 View  03/21/2013   CLINICAL DATA:  Fever. Short of breath. Chest pain. Abdominal pain. Cough. Congestion.  EXAM: CHEST  2 VIEW  COMPARISON:  03/16/2013.  FINDINGS: Increasing bilateral interstitial and  airspace opacity compared to the prior exam. The cardiopericardial silhouette is mildly enlarged for projection. Mediastinal contours appear within normal limits. Thickening of the fissures is present on the lateral view. The overall constellation of findings is most compatible with CHF/volume overload with interstitial and alveolar pulmonary edema. Superimposed pneumonia is difficult to exclude but not favored. There is no  gross pleural fusion. Cholecystectomy clips are present in the right upper quadrant.  IMPRESSION: Constellation of findings most compatible with moderate CHF/volume overload.   Electronically Signed   By: Andreas Newport M.D.   On: 03/21/2013 18:50   Dg Chest 2 View  03/16/2013   CLINICAL DATA:  Shortness of breath  EXAM: CHEST  2 VIEW  COMPARISON:  02/19/2013  FINDINGS: The cardio pericardial silhouette is enlarged. There is right base atelectasis or infiltrate. Small right pleural effusion associated. Imaged bony structures of the thorax are intact.  IMPRESSION: Right base atelectasis or pneumonia with small right pleural effusion.   Electronically Signed   By: Kennith Center M.D.   On: 03/16/2013 17:34   Ct Abdomen Pelvis W Contrast  03/16/2013   CLINICAL DATA:  Diffuse stabbing abdominal pain with nausea and vomiting for 2 days, chronic dialysis  EXAM: CT ABDOMEN AND PELVIS WITH CONTRAST  TECHNIQUE: Multidetector CT imaging of the abdomen and pelvis was performed using the standard protocol following bolus administration of intravenous contrast.  CONTRAST:  OMNIPAQUE IOHEXOL 300 MG/ML  SOLN  COMPARISON:  06/10/2012  FINDINGS: There is mild scattered bilateral hazy ground-glass attenuation with more focal dependent atelectasis bilaterally. There is a tiny left and small to moderate right pleural effusion.  There are no focal hepatic abnormalities. There is mild periportal edema. The spleen, pancreas, and adrenal glands are normal. There is severe bilateral renal atrophy and cystic  change. 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 There is a nonobstructive bowel gas pattern with mild diffuse colon wall thickening. There is also mild infiltration of the omentum and inflammatory change in the adjacent fat. These findings are similar to the prior study. The appendix is normal. Reproductive organs and bladder are normal. There is a small volume of ascites. Small transplanted kidney left flank unchanged. There are no acute musculoskeletal findings.  IMPRESSION: Diffuse colon wall thickening with mild infiltration of the omentum. The findings again suggest a diffuse colitis.   Electronically Signed   By: Esperanza Heir M.D.   On: 03/16/2013 17:45   Nm Pulmonary Perf And Vent  03/16/2013   CLINICAL DATA:  Shortness of breath  EXAM: NUCLEAR MEDICINE VENTILATION - PERFUSION LUNG SCAN  TECHNIQUE: Ventilation images were obtained in multiple projections using inhaled aerosol technetium 99 M DTPA. Perfusion images were obtained in multiple projections after intravenous injection of Tc-99m MAA.  COMPARISON:  02/27/2011.  RADIOPHARMACEUTICALS:  40 mCi Tc-55m DTPA aerosol and 6 mCi Tc-65m MAA  FINDINGS: Ventilation: No focal ventilation defect.  Perfusion: No wedge shaped peripheral perfusion defects to suggest acute pulmonary embolism.  IMPRESSION: Very low probability for pulmonary embolus. .   Electronically Signed   By: Kennith Center M.D.   On: 03/16/2013 17:21    CBC  Recent Labs Lab 03/17/13 2300 03/19/13 0753 03/21/13 1819 03/22/13 1200 03/22/13 2055 03/23/13 0618 03/23/13 1925  WBC 5.4 4.9 8.1 5.4  --   --   --   HGB 9.9* 10.1* 11.9* 10.3* 10.7* 10.6* 9.4*  HCT 30.4* 31.5* 37.1 31.5* 33.4* 33.3* 30.3*  PLT 139* 156 134* 160  --   --   --   MCV 103.1* 105.4* 106.0* 104.7*  --   --   --   MCH 33.6 33.8 34.0 34.2*  --   --   --   MCHC 32.6 32.1 32.1 32.7  --   --   --   RDW 17.1* 18.0* 17.2* 16.8*  --   --   --  LYMPHSABS  --   --  1.1  --    --   --   --   MONOABS  --   --  0.4  --   --   --   --   EOSABS  --   --  0.1  --   --   --   --   BASOSABS  --   --  0.0  --   --   --   --     Chemistries   Recent Labs Lab 03/17/13 2300 03/19/13 0753 03/21/13 1819 03/22/13 1200 03/23/13 0618 03/23/13 0858  NA 136 138 133* 132* 136  --   K 3.3* 3.9 5.8* 6.5* 4.9 3.8  CL 95* 100 98 98 97  --   CO2 21 23 20 20 25   --   GLUCOSE 199* 47* 122* 93 87  --   BUN 25* 37* 29* 38* 14  --   CREATININE 5.55* 8.31* 7.62* 9.46* 5.74*  --   CALCIUM 7.9* 7.5* 8.3* 8.1* 8.1*  --   AST  --   --  33  --   --   --   ALT  --   --  17  --   --   --   ALKPHOS  --   --  125*  --   --   --   BILITOT  --   --  0.8  --   --   --    ------------------------------------------------------------------------------------------------------------------ estimated creatinine clearance is 11.6 ml/min (by C-G formula based on Cr of 5.74). ------------------------------------------------------------------------------------------------------------------ No results found for this basename: HGBA1C,  in the last 72 hours ------------------------------------------------------------------------------------------------------------------ No results found for this basename: CHOL, HDL, LDLCALC, TRIG, CHOLHDL, LDLDIRECT,  in the last 72 hours ------------------------------------------------------------------------------------------------------------------ No results found for this basename: TSH, T4TOTAL, FREET3, T3FREE, THYROIDAB,  in the last 72 hours ------------------------------------------------------------------------------------------------------------------ No results found for this basename: VITAMINB12, FOLATE, FERRITIN, TIBC, IRON, RETICCTPCT,  in the last 72 hours  Coagulation profile No results found for this basename: INR, PROTIME,  in the last 168 hours  No results found for this basename: DDIMER,  in the last 72 hours  Cardiac Enzymes No results found  for this basename: CK, CKMB, TROPONINI, MYOGLOBIN,  in the last 168 hours ------------------------------------------------------------------------------------------------------------------ No components found with this basename: POCBNP,

## 2013-03-25 ENCOUNTER — Encounter (HOSPITAL_COMMUNITY): Payer: Self-pay | Admitting: Gastroenterology

## 2013-03-25 ENCOUNTER — Encounter (HOSPITAL_COMMUNITY)
Admission: EM | Disposition: A | Discharge status: Home / Self Care | Payer: Self-pay | Source: Home / Self Care | Attending: Internal Medicine

## 2013-03-25 LAB — GLUCOSE, CAPILLARY
Glucose-Capillary: 290 mg/dL — ABNORMAL HIGH (ref 70–99)
Glucose-Capillary: 239 mg/dL — ABNORMAL HIGH (ref 70–99)
Glucose-Capillary: 511 mg/dL — ABNORMAL HIGH (ref 70–99)
Glucose-Capillary: 576 mg/dL (ref 70–99)

## 2013-03-25 LAB — TACROLIMUS LEVEL: Tacrolimus (FK506) - LabCorp: 1.3 ng/mL

## 2013-03-25 SURGERY — SIGMOIDOSCOPY, FLEXIBLE
Anesthesia: Moderate Sedation

## 2013-03-25 MED ORDER — AMLODIPINE BESYLATE 10 MG PO TABS
10.0000 mg | ORAL_TABLET | Freq: Every day | ORAL | Status: DC
  Administered 2013-03-25 – 2013-03-27 (×3): 10 mg via ORAL
  Filled 2013-03-25 (×4): qty 1

## 2013-03-25 MED ORDER — HEPARIN SODIUM (PORCINE) 1000 UNIT/ML DIALYSIS: 1000.0000 [IU] | INTRAMUSCULAR | Status: DC | PRN

## 2013-03-25 MED ORDER — SODIUM CHLORIDE 0.9 % IV SOLN: 100.0000 mL | INTRAVENOUS | Status: DC | PRN

## 2013-03-25 MED ORDER — NEPRO/CARBSTEADY PO LIQD: 237.0000 mL | ORAL | Status: DC | PRN

## 2013-03-25 MED ORDER — HEPARIN SODIUM (PORCINE) 1000 UNIT/ML DIALYSIS: 20.0000 [IU]/kg | INTRAMUSCULAR | Status: DC | PRN

## 2013-03-25 MED ORDER — ALTEPLASE 2 MG IJ SOLR: 2.0000 mg | Freq: Once | INTRAMUSCULAR | Status: AC | PRN

## 2013-03-25 MED ORDER — DARBEPOETIN ALFA-POLYSORBATE 60 MCG/0.3ML IJ SOLN
60.0000 ug | INTRAMUSCULAR | Status: DC
  Filled 2013-03-25: qty 0.3

## 2013-03-25 MED ORDER — IRBESARTAN 150 MG PO TABS
150.0000 mg | ORAL_TABLET | Freq: Every day | ORAL | Status: DC
  Administered 2013-03-25 – 2013-03-27 (×3): 150 mg via ORAL
  Filled 2013-03-25 (×4): qty 1

## 2013-03-25 MED ORDER — INSULIN ASPART 100 UNIT/ML ~~LOC~~ SOLN
8.0000 [IU] | Freq: Once | SUBCUTANEOUS | Status: AC
  Administered 2013-03-25: 8 [IU] via SUBCUTANEOUS

## 2013-03-25 MED ORDER — LIDOCAINE-PRILOCAINE 2.5-2.5 % EX CREA: 1.0000 "application " | TOPICAL_CREAM | CUTANEOUS | Status: DC | PRN

## 2013-03-25 MED ORDER — INSULIN ASPART 100 UNIT/ML ~~LOC~~ SOLN
12.0000 [IU] | Freq: Once | SUBCUTANEOUS | Status: AC
  Administered 2013-03-26: 12 [IU] via SUBCUTANEOUS

## 2013-03-25 MED ORDER — PENTAFLUOROPROP-TETRAFLUOROETH EX AERO: 1.0000 "application " | INHALATION_SPRAY | CUTANEOUS | Status: DC | PRN

## 2013-03-25 MED ORDER — LIDOCAINE HCL (PF) 1 % IJ SOLN: 5.0000 mL | INTRAMUSCULAR | Status: DC | PRN

## 2013-03-25 NOTE — Progress Notes (Signed)
I have seen and examined this patient and agree with the plan of care awaiting biopies Moe Graca W 03/25/2013, 11:00 AM

## 2013-03-25 NOTE — Progress Notes (Signed)
Chart review complete.  Patient is not eligible for [facility] Care Management services because his/her PCP is not a [facility] primary care provider or is not [facility] affiliated.  For any additional questions or new referrals please contact Tim Henderson BSN RN [facility] Liaison at [phone] °

## 2013-03-25 NOTE — Progress Notes (Signed)
[facility] Progress Note  Subjective:   Still having blood with BM when she wipes. Appetite good. Ready to eat breakfast  Objective Filed Vitals:   03/24/13 1905 03/24/13 2107 03/25/13 0158 03/25/13 0427  BP: 132/70 83/46 113/54 136/76  Pulse: 80 81 79 81  Temp: 97.9 F (36.6 C) 98.9 F (37.2 C)  98.9 F (37.2 C)  TempSrc: Oral Oral  Oral  Resp: 16 18  18   Height:  5\' 1"  (1.549 m)    Weight:  70.398 kg (155 lb 3.2 oz)    SpO2: 100% 98%  100%   Physical Exam General: NAD Heart: RRR Lungs: no wheezes or rales Abdomen: soft NT Extremities:  No edema Dialysis Access: right thigh graft + bruit  Dialysis Orders: ([facility])  Optiflx 160 3.75 hr EDW 68.5 2K 2.5 Ca heparin 5000  Hectorol 1 Epo 10,000 right thigh graft  Recent labs: Hdg 10.1 10/16; Sept labs iPTH 213 Ca 9 P 1.6 (2.5 - August)) Hgb A!C 11.4 - July 2014   Assessment/Plan:  1. Abdominal pain/colitis -  prob due to mycophenalate, Dr. Arlean Hopping d/w transplant team at [facility] (Dr Mortimer Fries), they recommend stopping mycophenalate at this time, cont prograf and prednisone, shoot for prograf level of 4-5; have d/w pt who is agreeable, so will stop mycophenalate and order prograf level; sigmoidoscopy by Dr. Matthias Hughs looked ok; bx path pending - no blood seen on exam; on probiotic tx - as per Dr. Matthias Hughs 2. ESRD  TTS with hx failed KP tx on low dose immunosuppression per [facility] MD; prograf, prenidsone per Dr. Malena Catholic note 10/24 -felt to have residual pancreas fx given low insulin requirement which is the reason for continued IS meds; tight heparin unless having overt rectal bleeding 3. Anemia - with BRBPR reported prior to admission - Hgb 11.9  On admission drifting down into 9s; resume ESA Thursday at 60  4. Secondary hyperparathyroidism - recently outpt P had been low; cont hectorol; P 3.9 last admission off phoslo - since on CL now; will jut hold phoslo and follow conservatively  5. HTN/volume - back on usual meds BP  transiently low last pm;  parameters written for holding meds if BP drops. Net UF 3 L Tuesday 6. Nutrition - renal diet 7. DM - per primary  8. 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 Edilia Bo last admission - needs elective repair after colitis resolved - being planned per pt   Sheffield Slider, PA-C [facility] Beeper [phone] 03/25/2013,9:37 AM  LOS: 4 days    Additional Objective Labs: Basic Metabolic Panel:  Recent Labs Lab 03/19/13 0753  03/22/13 1200 03/23/13 0618 03/23/13 0858 03/24/13 1300  NA 138  < > 132* 136  --  131*  K 3.9  < > 6.5* 4.9 3.8 4.3  CL 100  < > 98 97  --  94*  CO2 23  < > 20 25  --  22  GLUCOSE 47*  < > 93 87  --  196*  BUN 37*  < > 38* 14  --  26*  CREATININE 8.31*  < > 9.46* 5.74*  --  7.19*  CALCIUM 7.5*  < > 8.1* 8.1*  --  6.8*  PHOS 3.9  --   --   --   --  6.1*  < > = values in this interval not displayed. Liver Function Tests:  Recent Labs Lab 03/19/13 0753 03/21/13 1819 03/24/13 1300  AST  --  33  --   ALT  --  17  --   ALKPHOS  --  125*  --   BILITOT  --  0.8  --   PROT  --  6.9  --   ALBUMIN 3.1* 3.3* 2.9*    Recent Labs Lab 03/21/13 1819  LIPASE 45   CBC:  Recent Labs Lab 03/19/13 0753 03/21/13 1819 03/22/13 1200  03/23/13 0618 03/23/13 1925 03/24/13 1300  WBC 4.9 8.1 5.4  --   --   --  4.2  NEUTROABS  --  6.4  --   --   --   --   --   HGB 10.1* 11.9* 10.3*  < > 10.6* 9.4* 9.7*  HCT 31.5* 37.1 31.5*  < > 33.3* 30.3* 29.9*  MCV 105.4* 106.0* 104.7*  --   --   --  103.8*  PLT 156 134* 160  --   --   --  151  < > = values in this interval not displayed. CBG:  Recent Labs Lab 03/24/13 0749 03/24/13 1143 03/24/13 1823 03/24/13 2104 03/25/13 0720  GLUCAP 165* 109* 223* 290* 239*   IStudies/Results: Ct Head Wo Contrast  03/23/2013   CLINICAL DATA:  Lethargy.  EXAM: CT HEAD WITHOUT CONTRAST  TECHNIQUE: Contiguous axial images were obtained from the base of the skull through  the vertex without intravenous contrast.  COMPARISON:  01/04/2013  FINDINGS: No mass lesion. No midline shift. No acute hemorrhage or hematoma. No extra-axial fluid collections. No evidence of acute infarction. There benign calcifications in the basal ganglia and cerebellar hemispheres, stable. Tiny old lacunar infarct in left basal ganglia, unchanged. No osseous abnormality.  IMPRESSION: No acute abnormalities.   Electronically Signed   By: Geanie Cooley M.D.   On: 03/23/2013 15:11   Medications: . sodium chloride 10 mL/hr at 03/23/13 0826   . amLODipine  10 mg Oral QHS   And  . irbesartan  150 mg Oral QHS  . clonazePAM  1 mg Oral QHS  . feeding supplement (RESOURCE BREEZE)  1 Container Oral BID BM  . gabapentin  100 mg Oral QHS  . insulin aspart  1-5 Units Subcutaneous TID WC  . insulin glargine  5 Units Subcutaneous BID  . metoprolol succinate  100 mg Oral QHS  . multivitamin  1 tablet Oral QHS  . pantoprazole (PROTONIX) IV  40 mg Intravenous QHS  . predniSONE  5 mg Oral Q breakfast  . saccharomyces boulardii  250 mg Oral BID  . sodium chloride  3 mL Intravenous Q12H  . tacrolimus  1 mg Oral BID

## 2013-03-25 NOTE — Progress Notes (Signed)
[facility]                                                                                Patient Demographics  [name], is a 42 y.o. female, DOB - Jun 29, 1970, ZOX:[id number]  Admit date - 03/21/2013   Admitting Physician Ron Parker, MD  Outpatient Primary MD for the patient is [facility], [name], MD  LOS - 4   Chief Complaint  Patient presents with  . Abdominal Pain      Interim history This is a 42 year old female with a history of diabetes, incisional disease requiring hemodialysis, that was recently admitted and discharged for rectal bleeding. Patient had refused using enemas on her previous admissions was discharged home with outpatient follow up flexible sigmoidoscopy. Her abdominal pain worsened with rectal bleeding so she decided to come back to the hospital. Patient underwent flex sigmoidoscopy today with biopsy. She continues to have some bright red blood per rectum. Will continue to monitor patient patient is also had very labile blood sugars.  Assessment & Plan    Principal Problem:   Rectal bleeding Active Problems:   Abdominal pain   DM (diabetes mellitus), type 1 with renal complications   Hyperlipidemia   Immunosuppression   Colitis   ESRD on hemodialysis  Rectal Bleeding -Possibe secondary to colitis, which may be secondary to cellcept. -Possible sigmoidoscopy DONE ON 10/28 and awaiting biopsy results.  - H&H stable.  - gi on board.   Anemia -Secondary to renal disease and rectal bleeding. -Patient currently hemodynamically stable. -Continue monitoring hemoglobin and hematocrit. H/H stable.   Intractable Abdominal pain, Acute on chronic -Continue pain control with IV Dilaudid -Likely secondary to colitis.  End stage renal disease, requiring hemodialysis -Nephrology consulted  Hypoglycemia with Diabetes mellitus, Type 1 -Discontinue Lantus at this time. Will continue insulin sliding scale with CBG. 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   Code Status: Full  Family Communication: None  Disposition Plan: SCDs   Procedures None  Consults   [facility]  DVT Prophylaxis SCDs  Lab Results  Component Value Date   PLT 151 03/24/2013    Medications  Scheduled Meds: . irbesartan  150 mg Oral QHS   And  . amLODipine  10 mg Oral QHS  . clonazePAM  1 mg Oral QHS  . [START ON 03/26/2013] darbepoetin (ARANESP) injection - DIALYSIS  60 mcg Intravenous Q Thu-HD  . feeding supplement (RESOURCE BREEZE)  1 Container Oral BID BM  . gabapentin  100 mg Oral QHS  . insulin aspart  1-5 Units Subcutaneous TID WC  . insulin glargine  5 Units Subcutaneous BID  . metoprolol succinate  100 mg Oral QHS  . multivitamin  1 tablet Oral QHS  . pantoprazole (PROTONIX) IV  40 mg Intravenous QHS  . predniSONE  5 mg Oral Q breakfast  . saccharomyces boulardii  250 mg Oral BID  . sodium chloride  3 mL Intravenous Q12H  . tacrolimus  1  mg Oral BID   Continuous Infusions: . sodium chloride 10 mL/hr at 03/23/13 0826   PRN Meds:.sodium chloride, acetaminophen, acetaminophen, albuterol, dextrose, diphenhydrAMINE, HYDROmorphone (DILAUDID) injection, ondansetron (ZOFRAN) IV, ondansetron, oxyCODONE, sodium chloride, zolpidem  Antibiotics   Anti-infectives   None     Time Spent in minutes   25 minutes   Balinda Heacock D.O. on 03/25/2013 at 3:26 PM  Between 7am to 7pm - Pager - [phone]  After 7pm go to [email or web address] - password [email or web address]  And look for the night coverage person covering for me after hours  [facility] Office  [phone]    Subjective:   [name] seen and examined today.  Patient denies any pain at this time her abdominal pain has improved slightly. She  continues to have bright red blood per rectum. Patient denies dizziness, chest pain, shortness of breath, N/V/D/C, new weakness, numbess, tingling.    Objective:   Filed Vitals:   03/24/13 1905 03/24/13 2107 03/25/13 0158 03/25/13 0427  BP: 132/70 83/46 113/54 136/76  Pulse: 80 81 79 81  Temp: 97.9 F (36.6 C) 98.9 F (37.2 C)  98.9 F (37.2 C)  TempSrc: Oral Oral  Oral  Resp: 16 18  18   Height:  5\' 1"  (1.549 m)    Weight:  70.398 kg (155 lb 3.2 oz)    SpO2: 100% 98%  100%    Wt Readings from Last 3 Encounters:  03/24/13 70.398 kg (155 lb 3.2 oz)  03/24/13 70.398 kg (155 lb 3.2 oz)  03/24/13 70.398 kg (155 lb 3.2 oz)     Intake/Output Summary (Last 24 hours) at 03/25/13 1526 Last data filed at 03/25/13 1100  Gross per 24 hour  Intake    900 ml  Output   3000 ml  Net  -2100 ml    Exam  General: Well developed, well nourished, NAD, appears stated age  HEENT: NCAT, PERRLA, EOMI, Anicteic Sclera, mucous membranes moist. No pharyngeal erythema or exudates  Neck: Supple, no JVD, no masses  Cardiovascular: S1 S2 auscultated, no rubs, murmurs or gallops. Regular rate and rhythm.  Respiratory: Clear to auscultation bilaterally with equal chest rise  Abdomen: Soft, obese, tender, nondistended, + bowel sounds  Extremities: warm dry without cyanosis clubbing or edema  Neuro: AAOx3, cranial nerves grossly intact.   Skin: Without rashes exudates or nodules  Data Review   Micro Results Recent Results (from the past 240 hour(s))  MRSA PCR SCREENING     Status: None   Collection Time    03/16/13  9:47 PM      Result Value Range Status   MRSA by PCR NEGATIVE  NEGATIVE Final   Comment:            The GeneXpert MRSA Assay (FDA     approved for NASAL specimens     only), is one component of a     comprehensive MRSA colonization     surveillance program. It is not     intended to diagnose MRSA     infection nor to guide or     monitor treatment for     MRSA  infections.    Radiology Reports Dg Chest 2 View  03/21/2013   CLINICAL DATA:  Fever. Short of breath. Chest pain. Abdominal pain. Cough. Congestion.  EXAM: CHEST  2 VIEW  COMPARISON:  03/16/2013.  FINDINGS: Increasing bilateral interstitial and airspace opacity compared to the prior exam. The cardiopericardial silhouette is mildly enlarged for projection. Mediastinal contours appear within  normal limits. Thickening of the fissures is present on the lateral view. The overall constellation of findings is most compatible with CHF/volume overload with interstitial and alveolar pulmonary edema. Superimposed pneumonia is difficult to exclude but not favored. There is no gross pleural fusion. Cholecystectomy clips are present in the right upper quadrant.  IMPRESSION: Constellation of findings most compatible with moderate CHF/volume overload.   Electronically Signed   By: Andreas Newport M.D.   On: 03/21/2013 18:50   Dg Chest 2 View  03/16/2013   CLINICAL DATA:  Shortness of breath  EXAM: CHEST  2 VIEW  COMPARISON:  02/19/2013  FINDINGS: The cardio pericardial silhouette is enlarged. There is right base atelectasis or infiltrate. Small right pleural effusion associated. Imaged bony structures of the thorax are intact.  IMPRESSION: Right base atelectasis or pneumonia with small right pleural effusion.   Electronically Signed   By: Kennith Center M.D.   On: 03/16/2013 17:34   Ct Abdomen Pelvis W Contrast  03/16/2013   CLINICAL DATA:  Diffuse stabbing abdominal pain with nausea and vomiting for 2 days, chronic dialysis  EXAM: CT ABDOMEN AND PELVIS WITH CONTRAST  TECHNIQUE: Multidetector CT imaging of the abdomen and pelvis was performed using the standard protocol following bolus administration of intravenous contrast.  CONTRAST:  OMNIPAQUE IOHEXOL 300 MG/ML  SOLN  COMPARISON:  06/10/2012  FINDINGS: There is mild scattered bilateral hazy ground-glass attenuation with more focal dependent atelectasis  bilaterally. There is a tiny left and small to moderate right pleural effusion.  There are no focal hepatic abnormalities. There is mild periportal edema. The spleen, pancreas, and adrenal glands are normal. There is severe bilateral renal atrophy and cystic change. 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 There is a nonobstructive bowel gas pattern with mild diffuse colon wall thickening. There is also mild infiltration of the omentum and inflammatory change in the adjacent fat. These findings are similar to the prior study. The appendix is normal. Reproductive organs and bladder are normal. There is a small volume of ascites. Small transplanted kidney left flank unchanged. There are no acute musculoskeletal findings.  IMPRESSION: Diffuse colon wall thickening with mild infiltration of the omentum. The findings again suggest a diffuse colitis.   Electronically Signed   By: Esperanza Heir M.D.   On: 03/16/2013 17:45   Nm Pulmonary Perf And Vent  03/16/2013   CLINICAL DATA:  Shortness of breath  EXAM: NUCLEAR MEDICINE VENTILATION - PERFUSION LUNG SCAN  TECHNIQUE: Ventilation images were obtained in multiple projections using inhaled aerosol technetium 99 M DTPA. Perfusion images were obtained in multiple projections after intravenous injection of Tc-42m MAA.  COMPARISON:  02/27/2011.  RADIOPHARMACEUTICALS:  40 mCi Tc-61m DTPA aerosol and 6 mCi Tc-39m MAA  FINDINGS: Ventilation: No focal ventilation defect.  Perfusion: No wedge shaped peripheral perfusion defects to suggest acute pulmonary embolism.  IMPRESSION: Very low probability for pulmonary embolus. .   Electronically Signed   By: Kennith Center M.D.   On: 03/16/2013 17:21    CBC  Recent Labs Lab 03/19/13 0753 03/21/13 1819 03/22/13 1200 03/22/13 2055 03/23/13 0618 03/23/13 1925 03/24/13 1300  WBC 4.9 8.1 5.4  --   --   --  4.2  HGB 10.1* 11.9* 10.3* 10.7* 10.6* 9.4* 9.7*  HCT 31.5* 37.1 31.5* 33.4* 33.3*  30.3* 29.9*  PLT 156 134* 160  --   --   --  151  MCV 105.4* 106.0* 104.7*  --   --   --  103.8*  MCH 33.8 34.0 34.2*  --   --   --  33.7  MCHC 32.1 32.1 32.7  --   --   --  32.4  RDW 18.0* 17.2* 16.8*  --   --   --  16.3*  LYMPHSABS  --  1.1  --   --   --   --   --   MONOABS  --  0.4  --   --   --   --   --   EOSABS  --  0.1  --   --   --   --   --   BASOSABS  --  0.0  --   --   --   --   --     Chemistries   Recent Labs Lab 03/19/13 0753 03/21/13 1819 03/22/13 1200 03/23/13 0618 03/23/13 0858 03/24/13 1300  NA 138 133* 132* 136  --  131*  K 3.9 5.8* 6.5* 4.9 3.8 4.3  CL 100 98 98 97  --  94*  CO2 23 20 20 25   --  22  GLUCOSE 47* 122* 93 87  --  196*  BUN 37* 29* 38* 14  --  26*  CREATININE 8.31* 7.62* 9.46* 5.74*  --  7.19*  CALCIUM 7.5* 8.3* 8.1* 8.1*  --  6.8*  AST  --  33  --   --   --   --   ALT  --  17  --   --   --   --   ALKPHOS  --  125*  --   --   --   --   BILITOT  --  0.8  --   --   --   --    ------------------------------------------------------------------------------------------------------------------ estimated creatinine clearance is 9.3 ml/min (by C-G formula based on Cr of 7.19). ------------------------------------------------------------------------------------------------------------------ No results found for this basename: HGBA1C,  in the last 72 hours ------------------------------------------------------------------------------------------------------------------ No results found for this basename: CHOL, HDL, LDLCALC, TRIG, CHOLHDL, LDLDIRECT,  in the last 72 hours ------------------------------------------------------------------------------------------------------------------ No results found for this basename: TSH, T4TOTAL, FREET3, T3FREE, THYROIDAB,  in the last 72 hours ------------------------------------------------------------------------------------------------------------------ No results found for this basename: VITAMINB12, FOLATE,  FERRITIN, TIBC, IRON, RETICCTPCT,  in the last 72 hours  Coagulation profile No results found for this basename: INR, PROTIME,  in the last 168 hours  No results found for this basename: DDIMER,  in the last 72 hours  Cardiac Enzymes No results found for this basename: CK, CKMB, TROPONINI, MYOGLOBIN,  in the last 168 hours ------------------------------------------------------------------------------------------------------------------ No components found with this basename: POCBNP,

## 2013-03-25 NOTE — Progress Notes (Signed)
The patient appears to be doing a little bit better. She says her diarrhea is improving, and her last bowel movement was last night (probably about 12 hours ago). No stools are recorded in the electronic record; I have asked the patient to save her stools so that the nursing record their appearance. The patient remains on Florastor probiotic therapy.  Biopsies from yesterday sigmoidoscopy are still pending.  The patient states she is still having rectal bleeding (bright red blood) with her bowel movements; it is recalled that there was no blood in the rectosigmoid lumen at the time of her exam yesterday, suggesting that this might be rectal outlet bleeding from hemorrhoids is indeed any blood is truly present.  Plan: Await biopsies, continue clinical monitoring. Continue probiotic.  [name], M.D. [phone]

## 2013-03-25 NOTE — Progress Notes (Signed)
Pt c/o pain this am around 9am, no pain med given d/t IV came out and 2 IV RN's attempted to restart and were not successful, Dr. Blake Divine notified that pt has no IV access, pt stated she tried to sleep off her pain, pt ambulated in hallway and tolerated well, pt stable, will continue to monitor

## 2013-03-26 LAB — GLUCOSE, CAPILLARY
Glucose-Capillary: 316 mg/dL — ABNORMAL HIGH (ref 70–99)
Glucose-Capillary: 41 mg/dL — CL (ref 70–99)
Glucose-Capillary: 118 mg/dL — ABNORMAL HIGH (ref 70–99)
Glucose-Capillary: 200 mg/dL — ABNORMAL HIGH (ref 70–99)

## 2013-03-26 LAB — RENAL FUNCTION PANEL
Albumin: 3 g/dL — ABNORMAL LOW (ref 3.5–5.2)
BUN: 21 mg/dL (ref 6–23)
CO2: 23 mEq/L (ref 19–32)
Calcium: 7.2 mg/dL — ABNORMAL LOW (ref 8.4–10.5)
Chloride: 98 mEq/L (ref 96–112)
Creatinine, Ser: 5.51 mg/dL — ABNORMAL HIGH (ref 0.50–1.10)
GFR calc Af Amer: 10 mL/min — ABNORMAL LOW (ref >90)
GFR calc non Af Amer: 9 mL/min — ABNORMAL LOW (ref >90)
Glucose, Bld: 233 mg/dL — ABNORMAL HIGH (ref 70–99)
Phosphorus: 4 mg/dL (ref 2.3–4.6)
Potassium: 4.6 mEq/L (ref 3.5–5.1)
Sodium: 133 mEq/L — ABNORMAL LOW (ref 135–145)

## 2013-03-26 LAB — CBC
Hemoglobin: 10 g/dL — ABNORMAL LOW (ref 12.0–15.0)
MCH: 33.3 pg (ref 26.0–34.0)
MCHC: 31.8 g/dL (ref 30.0–36.0)
MCV: 104.7 fL — ABNORMAL HIGH (ref 78.0–100.0)
Platelets: 135 10*3/uL — ABNORMAL LOW (ref 150–400)
RBC: 3 MIL/uL — ABNORMAL LOW (ref 3.87–5.11)
RDW: 15.5 % (ref 11.5–15.5)

## 2013-03-26 LAB — HEPATITIS B SURFACE ANTIBODY,QUALITATIVE: Hep B S Ab: NEGATIVE

## 2013-03-26 LAB — HEPATITIS B SURFACE ANTIGEN: Hepatitis B Surface Ag: NEGATIVE

## 2013-03-26 LAB — HEPATITIS B CORE ANTIBODY, TOTAL: Hep B Core Total Ab: REACTIVE — AB

## 2013-03-26 MED ORDER — GLUCOSE 40 % PO GEL: 1.0000 | ORAL | Status: DC | PRN

## 2013-03-26 MED ORDER — CALCIUM ACETATE 667 MG PO CAPS
667.0000 mg | ORAL_CAPSULE | Freq: Three times a day (TID) | ORAL | Status: DC
  Filled 2013-03-26 (×3): qty 1

## 2013-03-26 MED ORDER — ONDANSETRON HCL 4 MG/2ML IJ SOLN
INTRAMUSCULAR | Status: AC
  Administered 2013-03-26: 4 mg via INTRAVENOUS
  Filled 2013-03-26: qty 2

## 2013-03-26 MED ORDER — CALCIUM ACETATE 667 MG PO CAPS
2001.0000 mg | ORAL_CAPSULE | Freq: Three times a day (TID) | ORAL | Status: DC
  Administered 2013-03-26 – 2013-03-28 (×6): 2001 mg via ORAL
  Filled 2013-03-26 (×9): qty 3

## 2013-03-26 MED ORDER — DIPHENHYDRAMINE HCL 50 MG/ML IJ SOLN
INTRAMUSCULAR | Status: AC
  Administered 2013-03-26: 50 mg
  Filled 2013-03-26: qty 1

## 2013-03-26 MED ORDER — DARBEPOETIN ALFA-POLYSORBATE 100 MCG/0.5ML IJ SOLN
100.0000 ug | INTRAMUSCULAR | Status: DC
  Administered 2013-03-26: 100 ug via INTRAVENOUS

## 2013-03-26 MED ORDER — HYDROMORPHONE HCL PF 1 MG/ML IJ SOLN
INTRAMUSCULAR | Status: AC
  Filled 2013-03-26: qty 1

## 2013-03-26 MED ORDER — DARBEPOETIN ALFA-POLYSORBATE 100 MCG/0.5ML IJ SOLN
INTRAMUSCULAR | Status: AC
  Administered 2013-03-26: 100 ug via INTRAVENOUS
  Filled 2013-03-26: qty 0.5

## 2013-03-26 MED ORDER — NEPRO/CARBSTEADY PO LIQD: 237.0000 mL | ORAL | Status: DC

## 2013-03-26 MED ORDER — PANTOPRAZOLE SODIUM 40 MG PO TBEC
40.0000 mg | DELAYED_RELEASE_TABLET | Freq: Every day | ORAL | Status: DC
  Administered 2013-03-26 – 2013-03-27 (×2): 40 mg via ORAL
  Filled 2013-03-26 (×2): qty 1

## 2013-03-26 NOTE — Progress Notes (Signed)
The patient's diarrhea is doing better. She indicates that she had diarrhea last night, and indeed only one stool is recorded in the past 24 hours. There were some streaks of red blood with it, but no frank hematochezia.  She still has diffuse pain across her midabdomen, roughly at the level of the umbilicus, but she is eating okay and the pain does not seem to be affected by eating.  Her sigmoidoscopic biopsies came back normal.  Impression: No clear cause for the patient's abdominal discomfort and diarrhea. I have reviewed the CT scan showing "diffuse colitis" with perhaps some omental standing, raising the question of some sort of inflammatory process such as a viral colitis that might have resolved in the week between when the CT was performed and the sigmoidoscopy was performed. There is not good evidence for inflammatory bowel disease. Her minimal hematochezia is apparently of rectal outlet origin.  Recommendations:  1. I will sign off at this time. However, we can see the patient again at your request if needed.  2. I would continue probiotic therapy, which might help her symptoms of diarrhea and abdominal discomfort. Unfortunately, probiotics are somewhat expensive and not covered by insurance. The preferred brands, such as the Florastor she is currently on in the hospital, or Align (bifidobacteria) seem to have the best effectiveness, but are also the most expensive. I would prefer that she continue one or the other of these 2 brands for at least a couple of weeks following discharge. Hopefully that would be affordable for the patient. If not, any over-the-counter probiotic would probably be somewhat helpful.  3. If her symptoms persist, consideration could be given to a trial of an anti-motility agent such as loperamide, or 2 antispasmodics, such as dicyclomine.  4. If there is a continued problem over the next month or so, consideration could be also given to a repeat CT scan, followed by  full colonoscopy if ongoing, an equivocal colonic and mesenteric or omental inflammation are demonstrated. However, it would be my preference not to put this medically ill patient through a full colonoscopy, which would probably require deep propofol sedation, unless it is clinically necessary.  [name], M.D. [phone]

## 2013-03-26 NOTE — Progress Notes (Signed)
Addendum: Please see previous note. At this time, there is no impressive abdominal tenderness in the midabdomen, where she reports pain.  Florencia Reasons, M.D. [phone]

## 2013-03-26 NOTE — Progress Notes (Signed)
[facility] Progress Note  Subjective:   Just now falling asleep.  Problems with low BS and getting IV restarted. Diarrhea this am, still has blood when she wipes. Lower part of stom hurts   Objective Filed Vitals:   03/25/13 1620 03/25/13 1630 03/25/13 2046 03/26/13 0503  BP: 156/94 138/68 173/78 136/54  Pulse: 88  98 88  Temp: 98.7 F (37.1 C)  97.8 F (36.6 C) 98 F (36.7 C)  TempSrc:   Oral Oral  Resp: 17  18 18   Height:   5\' 1"  (1.549 m)   Weight:   70.398 kg (155 lb 3.2 oz)   SpO2: 100%  97% 96%   Physical Exam General: NAD Heart: RRR Gr2/6 SEM Lungs: few crackles at bases Abdomen: obese + BS, soft, and c/o pain on palp lower abdm,no guarding Extremities: no LE edema Dialysis Access: right thigh graft + bruit  Dialysis Orders: ([facility] TTS)  Optiflx 160 3.75 hr EDW 68.5 2K 2.5 Ca heparin 5000  Hectorol 1 Epo 10,000 right thigh graft  Recent labs: Hdg 10.1 10/16; Sept labs iPTH 213 Ca 9 P 1.6 (2.5 - August)) Hgb A!C 11.4 - July 2014  Assessment/Plan: 1. Abdominal pain/colitis - possibly due to mycophenalate, Dr. Arlean Hopping d/w transplant team at [facility] (Dr Mortimer Fries), they recommend stopping mycophenalate at this time, cont prograf and prednisone, shoot for prograf level of 4-5; have d/w pt who is agreeable, so will stop mycophenalate and order prograf level; sigmoidoscopy by Dr. Matthias Hughs looked ok; bx path of sigmoid and rectum NEG - no blood seen on exam; on probiotic tx - as per Dr. Matthias Hughs;  2. 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 Malena Catholic note 10/24 -felt to have residual pancreas fx given low insulin requirement which is the reason for continued IS meds; tight heparin unless having overt rectal bleeding  3. Anemia - with BRBPR reported prior to admission - Hgb 11.9 On admission drifting down into 9s; resume ESA today at 60; tight heparin unless overtly bleeding  4. Secondary hyperparathyroidism -  recently outpt P had been low; cont hectorol 1; P 3.9 last admission off phoslo - P back up to 6.1 - resume phoslo 1 ac- repeat labs today Ca low 6.8 Tuesday - repeat labs pre HD today; start with 2.5 Ca bath 5. HTN/volume - back on usual meds BP back up in usual range; parameters written for holding meds if BP drops. Net UF 3 L Tuesday  6. Nutrition - renal diet  7. DM - per primary  8. 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 Edilia Bo last admission - needs elective repair after colitis resolved - being planned per pt  Sheffield Slider, PA-C [facility] Beeper [phone] 03/26/2013,10:24 AM  LOS: 5 days  I have seen and examined this patient and agree with the plan of care seen, examined and eval.  Do HD this am.  Doubt role of Mycophenolate.  F/u labs .  Daric Koren L 03/26/2013, 11:41 AM    Additional Objective Labs: Basic Metabolic Panel:  Recent Labs Lab 03/22/13 1200 03/23/13 0618 03/23/13 0858 03/24/13 1300  NA 132* 136  --  131*  K 6.5* 4.9 3.8 4.3  CL 98 97  --  94*  CO2 20 25  --  22  GLUCOSE 93 87  --  196*  BUN 38* 14  --  26*  CREATININE 9.46* 5.74*  --  7.19*  CALCIUM 8.1* 8.1*  --  6.8*  PHOS  --   --   --  6.1*   Liver Function Tests:  Recent Labs Lab 03/21/13 1819 03/24/13 1300  AST 33  --   ALT 17  --   ALKPHOS 125*  --   BILITOT 0.8  --   PROT 6.9  --   ALBUMIN 3.3* 2.9*    Recent Labs Lab 03/21/13 1819  LIPASE 45   CBC:  Recent Labs Lab 03/21/13 1819 03/22/13 1200  03/23/13 0618 03/23/13 1925 03/24/13 1300  WBC 8.1 5.4  --   --   --  4.2  NEUTROABS 6.4  --   --   --   --   --   HGB 11.9* 10.3*  < > 10.6* 9.4* 9.7*  HCT 37.1 31.5*  < > 33.3* 30.3* 29.9*  MCV 106.0* 104.7*  --   --   --  103.8*  PLT 134* 160  --   --   --  151  < > = values in this interval not displayed. CBG:  Recent Labs Lab 03/25/13 2316 03/26/13 0110 03/26/13 0456 03/26/13 0537 03/26/13 0800  GLUCAP 576* 316* 41* 95  118*  Medications: . sodium chloride 10 mL/hr at 03/23/13 0826   . irbesartan  150 mg Oral QHS   And  . amLODipine  10 mg Oral QHS  . clonazePAM  1 mg Oral QHS  . darbepoetin (ARANESP) injection - DIALYSIS  60 mcg Intravenous Q Thu-HD  . feeding supplement (RESOURCE BREEZE)  1 Container Oral BID BM  . gabapentin  100 mg Oral QHS  . insulin aspart  1-5 Units Subcutaneous TID WC  . insulin glargine  5 Units Subcutaneous BID  . metoprolol succinate  100 mg Oral QHS  . multivitamin  1 tablet Oral QHS  . pantoprazole (PROTONIX) IV  40 mg Intravenous QHS  . predniSONE  5 mg Oral Q breakfast  . saccharomyces boulardii  250 mg Oral BID  . sodium chloride  3 mL Intravenous Q12H  . tacrolimus  1 mg Oral BID

## 2013-03-26 NOTE — Progress Notes (Signed)
[facility]                                                                                Patient Demographics  [name], is a 42 y.o. female, DOB - 1971-01-22, RUE:[id number]  Admit date - 03/21/2013   Admitting Physician Ron Parker, MD  Outpatient Primary MD for the patient is [name], [name], MD  LOS - 5   Chief Complaint  Patient presents with  . Abdominal Pain      Interim history This is a 42 year old female with a history of diabetes, incisional disease requiring hemodialysis, that was recently admitted and discharged for rectal bleeding. Patient had refused using enemas on her previous admissions was discharged home with outpatient follow up flexible sigmoidoscopy. Her abdominal pain worsened with rectal bleeding so she decided to come back to the hospital. Patient underwent flex sigmoidoscopy today with biopsy. She continues to have some bright red blood per rectum. Will continue to monitor patient patient is also had very labile blood sugars.  Assessment & Plan    Principal Problem:   Rectal bleeding Active Problems:   Abdominal pain   DM (diabetes mellitus), type 1 with renal complications   Hyperlipidemia   Immunosuppression   Colitis   ESRD on hemodialysis  Rectal Bleeding -Possibe secondary to colitis, which may be secondary to cellcept. -Possible sigmoidoscopy DONE ON 10/28 and  Biopsy results normal. - H&H stable.  - gi on board.   Anemia -Secondary to renal disease and rectal bleeding. -Patient currently hemodynamically stable. -Continue monitoring hemoglobin and hematocrit. H/H stable.   Intractable Abdominal pain, Acute on chronic -Continue pain control with IV Dilaudid -Likely secondary to colitis.  End stage renal disease, requiring hemodialysis -Nephrology consulted  Hypoglycemia with Diabetes mellitus, Type 1 -Discontinue Lantus at this time. Will continue insulin sliding scale with CBG. 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   Code Status: Full  Family Communication: None  Disposition Plan: SCDs   Procedures None  Consults   [facility]  DVT Prophylaxis SCDs  Lab Results  Component Value Date   PLT 135* 03/26/2013    Medications  Scheduled Meds: . irbesartan  150 mg Oral QHS   And  . amLODipine  10 mg Oral QHS  . calcium acetate  2,001 mg Oral TID WC  . clonazePAM  1 mg Oral QHS  . darbepoetin (ARANESP) injection - DIALYSIS  100 mcg Intravenous Q Thu-HD  . [START ON 03/27/2013] feeding supplement (NEPRO CARB STEADY)  237 mL Oral Q24H  . gabapentin  100 mg Oral QHS  . insulin aspart  1-5 Units Subcutaneous TID WC  . insulin glargine  5 Units Subcutaneous BID  . metoprolol succinate  100 mg Oral QHS  . multivitamin  1 tablet Oral QHS  . pantoprazole  40 mg Oral QHS  . predniSONE  5 mg Oral Q breakfast  . saccharomyces boulardii  250 mg Oral BID  . sodium chloride  3  mL Intravenous Q12H  . tacrolimus  1 mg Oral BID   Continuous Infusions: . sodium chloride 10 mL/hr at 03/23/13 0826   PRN Meds:.sodium chloride, sodium chloride, sodium chloride, acetaminophen, acetaminophen, albuterol, dextrose, dextrose, diphenhydrAMINE, feeding supplement (NEPRO CARB STEADY), heparin, heparin, HYDROmorphone (DILAUDID) injection, lidocaine (PF), lidocaine-prilocaine, ondansetron (ZOFRAN) IV, ondansetron, oxyCODONE, pentafluoroprop-tetrafluoroeth, sodium chloride, zolpidem  Antibiotics   Anti-infectives   None     Time Spent in minutes   25 minutes   Shalicia Craghead D.O. on 03/26/2013 at 5:50 PM  Between 7am to 7pm - Pager - [phone]  After 7pm go to [email or web address] - password [email or web address]  And look for the night coverage person covering for me after hours  [facility]  Group Office  [phone]    Subjective:   Conita Amenta seen and examined today.  Patient denies any pain at this time her abdominal pain has improved slightly. She continues to have bright red blood per rectum. Patient denies dizziness, chest pain, shortness of breath, N/V/D/C, new weakness, numbess, tingling.    Objective:   Filed Vitals:   03/26/13 1533 03/26/13 1600 03/26/13 1630 03/26/13 1700  BP: 115/57 125/81 116/69 134/70  Pulse: 129 79 84 93  Temp:      TempSrc:      Resp:      Height:      Weight:      SpO2:        Wt Readings from Last 3 Encounters:  03/26/13 72.3 kg (159 lb 6.3 oz)  03/26/13 72.3 kg (159 lb 6.3 oz)  03/26/13 72.3 kg (159 lb 6.3 oz)     Intake/Output Summary (Last 24 hours) at 03/26/13 1750 Last data filed at 03/26/13 1300  Gross per 24 hour  Intake    960 ml  Output      0 ml  Net    960 ml    Exam  General: Well developed, well nourished, NAD, appears stated age  HEENT: NCAT, PERRLA, EOMI, Anicteic Sclera, mucous membranes moist. No pharyngeal erythema or exudates  Neck: Supple, no JVD, no masses  Cardiovascular: S1 S2 auscultated, no rubs, murmurs or gallops. Regular rate and rhythm.  Respiratory: Clear to auscultation bilaterally with equal chest rise  Abdomen: Soft, obese, tender, nondistended, + bowel sounds  Extremities: warm dry without cyanosis clubbing or edema  Neuro: AAOx3, cranial nerves grossly intact.   Skin: Without rashes exudates or nodules  Data Review   Micro Results Recent Results (from the past 240 hour(s))  MRSA PCR SCREENING     Status: None   Collection Time    03/16/13  9:47 PM      Result Value Range Status   MRSA by PCR NEGATIVE  NEGATIVE Final   Comment:            The GeneXpert MRSA Assay (FDA     approved for NASAL specimens     only), is one component of a     comprehensive MRSA colonization     surveillance program. It is not     intended to diagnose MRSA     infection nor to guide  or     monitor treatment for     MRSA infections.    Radiology Reports Dg Chest 2 View  03/21/2013   CLINICAL DATA:  Fever. Short of breath. Chest pain. Abdominal pain. Cough. Congestion.  EXAM: CHEST  2 VIEW  COMPARISON:  03/16/2013.  FINDINGS: Increasing bilateral interstitial and airspace opacity compared to the prior exam.  The cardiopericardial silhouette is mildly enlarged for projection. Mediastinal contours appear within normal limits. Thickening of the fissures is present on the lateral view. The overall constellation of findings is most compatible with CHF/volume overload with interstitial and alveolar pulmonary edema. Superimposed pneumonia is difficult to exclude but not favored. There is no gross pleural fusion. Cholecystectomy clips are present in the right upper quadrant.  IMPRESSION: Constellation of findings most compatible with moderate CHF/volume overload.   Electronically Signed   By: Andreas Newport M.D.   On: 03/21/2013 18:50   Dg Chest 2 View  03/16/2013   CLINICAL DATA:  Shortness of breath  EXAM: CHEST  2 VIEW  COMPARISON:  02/19/2013  FINDINGS: The cardio pericardial silhouette is enlarged. There is right base atelectasis or infiltrate. Small right pleural effusion associated. Imaged bony structures of the thorax are intact.  IMPRESSION: Right base atelectasis or pneumonia with small right pleural effusion.   Electronically Signed   By: Kennith Center M.D.   On: 03/16/2013 17:34   Ct Abdomen Pelvis W Contrast  03/16/2013   CLINICAL DATA:  Diffuse stabbing abdominal pain with nausea and vomiting for 2 days, chronic dialysis  EXAM: CT ABDOMEN AND PELVIS WITH CONTRAST  TECHNIQUE: Multidetector CT imaging of the abdomen and pelvis was performed using the standard protocol following bolus administration of intravenous contrast.  CONTRAST:  OMNIPAQUE IOHEXOL 300 MG/ML  SOLN  COMPARISON:  06/10/2012  FINDINGS: There is mild scattered bilateral hazy ground-glass attenuation with  more focal dependent atelectasis bilaterally. There is a tiny left and small to moderate right pleural effusion.  There are no focal hepatic abnormalities. There is mild periportal edema. The spleen, pancreas, and adrenal glands are normal. There is severe bilateral renal atrophy and cystic change. 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 There is a nonobstructive bowel gas pattern with mild diffuse colon wall thickening. There is also mild infiltration of the omentum and inflammatory change in the adjacent fat. These findings are similar to the prior study. The appendix is normal. Reproductive organs and bladder are normal. There is a small volume of ascites. Small transplanted kidney left flank unchanged. There are no acute musculoskeletal findings.  IMPRESSION: Diffuse colon wall thickening with mild infiltration of the omentum. The findings again suggest a diffuse colitis.   Electronically Signed   By: Esperanza Heir M.D.   On: 03/16/2013 17:45   Nm Pulmonary Perf And Vent  03/16/2013   CLINICAL DATA:  Shortness of breath  EXAM: NUCLEAR MEDICINE VENTILATION - PERFUSION LUNG SCAN  TECHNIQUE: Ventilation images were obtained in multiple projections using inhaled aerosol technetium 99 M DTPA. Perfusion images were obtained in multiple projections after intravenous injection of Tc-46m MAA.  COMPARISON:  02/27/2011.  RADIOPHARMACEUTICALS:  40 mCi Tc-26m DTPA aerosol and 6 mCi Tc-72m MAA  FINDINGS: Ventilation: No focal ventilation defect.  Perfusion: No wedge shaped peripheral perfusion defects to suggest acute pulmonary embolism.  IMPRESSION: Very low probability for pulmonary embolus. .   Electronically Signed   By: Kennith Center M.D.   On: 03/16/2013 17:21    CBC  Recent Labs Lab 03/21/13 1819 03/22/13 1200 03/22/13 2055 03/23/13 0618 03/23/13 1925 03/24/13 1300 03/26/13 1615  WBC 8.1 5.4  --   --   --  4.2 6.0  HGB 11.9* 10.3* 10.7* 10.6* 9.4* 9.7* 10.0*   HCT 37.1 31.5* 33.4* 33.3* 30.3* 29.9* 31.4*  PLT 134* 160  --   --   --  151  135*  MCV 106.0* 104.7*  --   --   --  103.8* 104.7*  MCH 34.0 34.2*  --   --   --  33.7 33.3  MCHC 32.1 32.7  --   --   --  32.4 31.8  RDW 17.2* 16.8*  --   --   --  16.3* 15.5  LYMPHSABS 1.1  --   --   --   --   --   --   MONOABS 0.4  --   --   --   --   --   --   EOSABS 0.1  --   --   --   --   --   --   BASOSABS 0.0  --   --   --   --   --   --     Chemistries   Recent Labs Lab 03/21/13 1819 03/22/13 1200 03/23/13 0618 03/23/13 0858 03/24/13 1300 03/26/13 1614  NA 133* 132* 136  --  131* 133*  K 5.8* 6.5* 4.9 3.8 4.3 4.6  CL 98 98 97  --  94* 98  CO2 20 20 25   --  22 23  GLUCOSE 122* 93 87  --  196* 233*  BUN 29* 38* 14  --  26* 21  CREATININE 7.62* 9.46* 5.74*  --  7.19* 5.51*  CALCIUM 8.3* 8.1* 8.1*  --  6.8* 7.2*  AST 33  --   --   --   --   --   ALT 17  --   --   --   --   --   ALKPHOS 125*  --   --   --   --   --   BILITOT 0.8  --   --   --   --   --    ------------------------------------------------------------------------------------------------------------------ estimated creatinine clearance is 12.3 ml/min (by C-G formula based on Cr of 5.51). ------------------------------------------------------------------------------------------------------------------ No results found for this basename: HGBA1C,  in the last 72 hours ------------------------------------------------------------------------------------------------------------------ No results found for this basename: CHOL, HDL, LDLCALC, TRIG, CHOLHDL, LDLDIRECT,  in the last 72 hours ------------------------------------------------------------------------------------------------------------------ No results found for this basename: TSH, T4TOTAL, FREET3, T3FREE, THYROIDAB,  in the last 72 hours ------------------------------------------------------------------------------------------------------------------ No results found for  this basename: VITAMINB12, FOLATE, FERRITIN, TIBC, IRON, RETICCTPCT,  in the last 72 hours  Coagulation profile No results found for this basename: INR, PROTIME,  in the last 168 hours  No results found for this basename: DDIMER,  in the last 72 hours  Cardiac Enzymes No results found for this basename: CK, CKMB, TROPONINI, MYOGLOBIN,  in the last 168 hours ------------------------------------------------------------------------------------------------------------------ No components found with this basename: POCBNP,

## 2013-03-26 NOTE — Progress Notes (Signed)
Inpatient Diabetes Program Recommendations  AACE/ADA: New Consensus Statement on Inpatient Glycemic Control (2013)  Target Ranges:  Prepandial:   less than 140 mg/dL      Peak postprandial:   less than 180 mg/dL (1-2 hours)      Critically ill patients:  140 - 180 mg/dL  Results for SHAINNA, FAUX (MRN [id number]) as of 03/26/2013 13:42  Ref. Range 03/25/2013 07:20 03/25/2013 11:27 03/25/2013 16:22 03/25/2013 20:46 03/25/2013 23:16 03/26/2013 01:10 03/26/2013 04:56 03/26/2013 05:37 03/26/2013 08:00 03/26/2013 11:34  Glucose-Capillary Latest Range: 70-99 mg/dL 409 (H) 811 (H) 914 (H) 511 (H) 576 (HH) 316 (H) 41 (LL) 95 118 (H) 200 (H)   Reason for Visit: wide fluctuation in blood glucose   Note: Noted that blood glucose was 511 mg/dl at 78:29.  Patient received Novolog 8 units at 21:43.  Blood glucose was 576 mg/dl at 56:21 and patient was given Novolog 12 units at 00:14.  According to the documentation on the I&O flowsheet patient did not eat any breakfast, 10% of lunch, and no supper yesterday but she did drink 60 cc at 11:00, 240 cc at 20:47, 240 cc at 23:00, and 240 cc at 5:03 and she ate 30% of breakfast today.  Unsure if fluids consumed contained sugar or not or if fluids were related to spike in blood glucose.  However, noted low blood glucose of 41 mg/dl at 3:08 was likely due to patient receiving a total of Novolog 20 units over 2.5 hours.  Please be mindful patient is sensitive to insulin and also has renal failure which causes blood glucose to drop quickly with large Novolog doses and renal failure causes patient to hold on to insulin longer.  No suggested recommendations in inpatient regimen for glycemic control at this time.  Will continue to follow.  Thanks, Orlando Penner, RN, MSN, CCRN Diabetes Coordinator Inpatient Diabetes Program [phone] (Team Pager) [phone] ([facility] office) [phone] [facility] office)

## 2013-03-26 NOTE — Progress Notes (Signed)
NUTRITION FOLLOW-UP  DOCUMENTATION CODES Per approved criteria  -Not Applicable   INTERVENTION: Discontinue Resource Breeze PO BID, each supplement provides 250 kcal and 9 grams of protein. Add Nepro Shake po daily, each supplement provides 425 kcal and 19 grams protein. RD to continue to follow nutrition care plan.  NUTRITION DIAGNOSIS: Inadequate oral intake related to altered GI function as evidenced by limited intake.  Goal: Intake to meet >90% of estimated nutrition needs.  Monitor:  Supplement tolerance, PO intake, labs, weight trend.  ASSESSMENT: Patient admitted on 10/25 with GI bleed. Patient with a history of ESRD, usually receives HD on Tuesday, Thursday, and Saturday; DM, depression, GERD.  Underwent flexible sigmoidoscopy on 10/28 - findings revealed possible minimal mucosal edema in the sigmoid region, no overt colitis or proctitis. Currently on a Renal 80-90 diet with Carbohydrate Modified restrictions. Meal intake is 20-30%. Pt reports that she does not like [employer], but she is open to trying Nepro, since she drinks either Boost or Ensure at home.   Height: Ht Readings from Last 1 Encounters:  03/25/13 5\' 1"  (1.549 m)    Weight: Wt Readings from Last 1 Encounters:  03/25/13 155 lb 3.2 oz (70.398 kg)  Admit wt is 158 lb  BMI:  Body mass index is 29.34 kg/(m^2). Overweight  Estimated Nutritional Needs: Kcal: 1800-2000 Protein: 80-100 gm Fluid: 1.2 L  Skin: hand incision  Diet Order: Renal 80-90; 1200 ml fluid restriction  EDUCATION NEEDS: -Education not appropriate at this time   Intake/Output Summary (Last 24 hours) at 03/26/13 1447 Last data filed at 03/26/13 1300  Gross per 24 hour  Intake    960 ml  Output      0 ml  Net    960 ml    Last BM: 10/30  Labs:   Recent Labs Lab 03/22/13 1200 03/23/13 0618 03/23/13 0858 03/24/13 1300  NA 132* 136  --  131*  K 6.5* 4.9 3.8 4.3  CL 98 97  --  94*  CO2 20 25  --  22  BUN 38*  14  --  26*  CREATININE 9.46* 5.74*  --  7.19*  CALCIUM 8.1* 8.1*  --  6.8*  PHOS  --   --   --  6.1*  GLUCOSE 93 87  --  196*    CBG (last 3)   Recent Labs  03/26/13 0537 03/26/13 0800 03/26/13 1134  GLUCAP 95 118* 200*    Scheduled Meds: . irbesartan  150 mg Oral QHS   And  . amLODipine  10 mg Oral QHS  . calcium acetate  2,001 mg Oral TID WC  . clonazePAM  1 mg Oral QHS  . darbepoetin (ARANESP) injection - DIALYSIS  100 mcg Intravenous Q Thu-HD  . feeding supplement (RESOURCE BREEZE)  1 Container Oral BID BM  . gabapentin  100 mg Oral QHS  . insulin aspart  1-5 Units Subcutaneous TID WC  . insulin glargine  5 Units Subcutaneous BID  . metoprolol succinate  100 mg Oral QHS  . multivitamin  1 tablet Oral QHS  . pantoprazole  40 mg Oral QHS  . predniSONE  5 mg Oral Q breakfast  . saccharomyces boulardii  250 mg Oral BID  . sodium chloride  3 mL Intravenous Q12H  . tacrolimus  1 mg Oral BID    Continuous Infusions: . sodium chloride 10 mL/hr at 03/23/13 0826    Jarold Motto MS, RD, LDN Pager: [phone] After-hours pager: [phone]

## 2013-03-26 NOTE — Progress Notes (Signed)
Hypoglycemic Event  CBG: 46  Treatment: 25ml dextrose and meal  Symptoms: none  Follow-up CBG: Time:0530 CBG Result:95  Possible Reasons for Event: insulin  Comments/MD notified:[facility] paged     Caprice Kluver  Remember to initiate Hypoglycemia Order Set & complete

## 2013-03-27 ENCOUNTER — Other Ambulatory Visit: Payer: Self-pay

## 2013-03-27 ENCOUNTER — Inpatient Hospital Stay (HOSPITAL_COMMUNITY): Payer: Medicare Other

## 2013-03-27 LAB — GLUCOSE, CAPILLARY
Glucose-Capillary: 326 mg/dL — ABNORMAL HIGH (ref 70–99)
Glucose-Capillary: 35 mg/dL — CL (ref 70–99)
Glucose-Capillary: 55 mg/dL — ABNORMAL LOW (ref 70–99)
Glucose-Capillary: 65 mg/dL — ABNORMAL LOW (ref 70–99)
Glucose-Capillary: 166 mg/dL — ABNORMAL HIGH (ref 70–99)
Glucose-Capillary: 181 mg/dL — ABNORMAL HIGH (ref 70–99)

## 2013-03-27 LAB — TROPONIN I: Troponin I: <0.3 ng/mL (ref <0.30)

## 2013-03-27 LAB — HEMOGLOBIN AND HEMATOCRIT, BLOOD: HCT: 35 % — ABNORMAL LOW (ref 36.0–46.0)

## 2013-03-27 MED ORDER — PANTOPRAZOLE SODIUM 40 MG PO TBEC: 40.0000 mg | DELAYED_RELEASE_TABLET | Freq: Every day | ORAL | Status: DC

## 2013-03-27 MED ORDER — INSULIN GLARGINE 100 UNIT/ML ~~LOC~~ SOLN: 10.0000 [IU] | Freq: Two times a day (BID) | SUBCUTANEOUS | Status: DC

## 2013-03-27 MED ORDER — NEPRO/CARBSTEADY PO LIQD: 237.0000 mL | ORAL | Status: DC

## 2013-03-27 MED ORDER — INSULIN GLARGINE 100 UNIT/ML ~~LOC~~ SOLN: 7.0000 [IU] | Freq: Two times a day (BID) | SUBCUTANEOUS | Status: DC

## 2013-03-27 MED ORDER — NITROGLYCERIN 0.4 MG SL SUBL
0.4000 mg | SUBLINGUAL_TABLET | SUBLINGUAL | Status: DC | PRN
  Administered 2013-03-27: 0.4 mg via SUBLINGUAL

## 2013-03-27 MED ORDER — NITROGLYCERIN 0.3 MG SL SUBL
0.3000 mg | SUBLINGUAL_TABLET | SUBLINGUAL | Status: DC | PRN
  Filled 2013-03-27: qty 100

## 2013-03-27 MED ORDER — INSULIN GLARGINE 100 UNIT/ML ~~LOC~~ SOLN
7.0000 [IU] | Freq: Two times a day (BID) | SUBCUTANEOUS | Status: DC
  Administered 2013-03-27 (×2): 7 [IU] via SUBCUTANEOUS
  Filled 2013-03-27 (×4): qty 0.07

## 2013-03-27 NOTE — Progress Notes (Signed)
Inpatient Diabetes Program Recommendations  AACE/ADA: New Consensus Statement on Inpatient Glycemic Control (2013)  Target Ranges:  Prepandial:   less than 140 mg/dL      Peak postprandial:   less than 180 mg/dL (1-2 hours)      Critically ill patients:  140 - 180 mg/dL    Results for [name], [name] (MRN [id number]) as of 03/27/2013 10:24  Ref. Range 03/25/2013 07:20 03/25/2013 11:27 03/25/2013 16:22 03/25/2013 20:46 03/25/2013 23:16  Glucose-Capillary Latest Range: 70-99 mg/dL 409 (H) 811 (H) 914 (H) 511 (H) 576 (HH)    Results for [name], [name] (MRN [id number]) as of 03/27/2013 10:24  Ref. Range 03/26/2013 01:10 03/26/2013 04:56 03/26/2013 05:37 03/26/2013 08:00 03/26/2013 11:34 03/26/2013 20:29  Glucose-Capillary Latest Range: 70-99 mg/dL 086 (H) 41 (LL) 95 578 (H) 200 (H) 263 (H)    Results for [name], [name] (MRN [id number]) as of 03/27/2013 10:24  Ref. Range 03/27/2013 05:06 03/27/2013 07:51  Glucose-Capillary Latest Range: 70-99 mg/dL 413 (H) 244 (H)    **Patient had hypoglycemia early yesterday morning (5am) after receiving 20 units of Novolog for elevated CBGs at 9pm and midnight.  Patient does not do well with large doses of Novolog and we probably should try to avoid giving her more than 5 units of Novolog at a time.  Noted patient received 8 units at 9pm and then another 12 units of Novolog at midnight.  **Woke up today with hyperglycemia.  Noted Lantus increased to home dose of 7 units bid.  **To avoid such extreme hyperglycemia, it may benefit patient to add a low dose amount of Novolog meal coverage to her inpatient regimen.  Novolog meal coverage may help to avoid CBGs in the 400 and 500s.  Then in return, if we can avoid extreme hyperglycemia, hopefully we can avoid the extreme hypoglycemia that is a result of large one time doses of Novolog.   **Recommend the following:  Add Novolog 3 units tid with meals (hold if pre-meal CBG <80 mg/dl, hold if patient does not  eat at least 50% of meal, hold if NPO)    Will follow. [name] RN, MSN, CDE Diabetes Coordinator Inpatient Diabetes Program Team Pager: [phone] (8a-10p)

## 2013-03-27 NOTE — Progress Notes (Signed)
[facility]                                                                                Patient Demographics  [name], is a 42 y.o. female, DOB - 17-Oct-1970, WGN:[id number]  Admit date - 03/21/2013   Admitting Physician Ron Parker, MD  Outpatient Primary MD for the patient is [facility], [name], MD  LOS - 6   Chief Complaint  Patient presents with  . Abdominal Pain      Interim history This is a 41 year old female with a history of diabetes, incisional disease requiring hemodialysis, that was recently admitted and discharged for rectal bleeding. Patient had refused using enemas on her previous admissions was discharged home with outpatient follow up flexible sigmoidoscopy. Her abdominal pain worsened with rectal bleeding so she decided to come back to the hospital. Patient underwent flex sigmoidoscopy today with biopsy. She continues to have some bright red blood per rectum. Will continue to monitor patient patient is also had very labile blood sugars.  Assessment & Plan    Principal Problem:   Rectal bleeding Active Problems:   Abdominal pain   DM (diabetes mellitus), type 1 with renal complications   Hyperlipidemia   Immunosuppression   Colitis  Rectal Bleeding -Possibe secondary to colitis, which may be secondary to cellcept. -Possible sigmoidoscopy DONE ON 10/28 and  Biopsy results normal. - H&H stable.  - gi on board.   Anemia -Secondary to renal disease and rectal bleeding. -Patient currently hemodynamically stable. -Continue monitoring hemoglobin and hematocrit. H/H stable.   Intractable Abdominal pain, Acute on chronic -Continue pain control with IV Dilaudid -Likely secondary to colitis.  End stage renal disease, requiring hemodialysis -Nephrology consulted  Hypoglycemia with Diabetes mellitus, Type 1 -Discontinue Lantus at this time. Will continue insulin sliding scale with CBG. 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   Substernal chest pain: EKG unchanged from before, troponin negative, releived with nitrate. Coughing ordered CXR and pending.   Code Status: Full  Family Communication: None  Disposition Plan: SCDs   Procedures None  Consults   [facility]  DVT Prophylaxis SCDs  Lab Results  Component Value Date   PLT 135* 03/26/2013    Medications  Scheduled Meds: . irbesartan  150 mg Oral QHS   And  . amLODipine  10 mg Oral QHS  . calcium acetate  2,001 mg Oral TID WC  . clonazePAM  1 mg Oral QHS  . darbepoetin (ARANESP) injection - DIALYSIS  100 mcg Intravenous Q Thu-HD  . feeding supplement (NEPRO CARB STEADY)  237 mL Oral Q24H  . gabapentin  100 mg Oral QHS  . insulin aspart  1-5 Units Subcutaneous TID WC  . insulin glargine  7 Units Subcutaneous BID  . metoprolol succinate  100 mg Oral QHS  . multivitamin  1 tablet Oral QHS  . pantoprazole  40 mg Oral QHS  . predniSONE  5 mg Oral Q breakfast  . saccharomyces boulardii  250 mg Oral BID  . sodium chloride  3 mL Intravenous Q12H  . tacrolimus  1 mg Oral BID   Continuous Infusions: . sodium chloride 10 mL/hr at 03/23/13 0826   PRN Meds:.sodium chloride, acetaminophen, acetaminophen, albuterol, dextrose, dextrose, diphenhydrAMINE, HYDROmorphone (DILAUDID) injection, nitroGLYCERIN, ondansetron (ZOFRAN) IV, ondansetron, oxyCODONE, sodium chloride, zolpidem  Antibiotics   Anti-infectives   None     Time Spent in minutes   25 minutes   Debbrah Sampedro D.O. on 03/27/2013 at 6:43 PM  Between 7am to 7pm - Pager - [phone]  After 7pm go to [email or web address] - password [email or web address]  And look for the night coverage person covering for me after hours  [facility] Office  [phone]    Subjective:   [name] seen and examined today.  Pt reports bleeding per rectum for 45 minutes and her H&H after that is actually better than before. She reported substernal chest pain, work up underway.   Objective:   Filed Vitals:   03/27/13 0503 03/27/13 1006 03/27/13 1145 03/27/13 1452  BP: 121/63 116/72  137/81  Pulse: 81 80  79  Temp: 99.4 F (37.4 C) 97.6 F (36.4 C)  98.6 F (37 C)  TempSrc: Oral Oral  Oral  Resp: 20 20  18   Height:      Weight:      SpO2: 93% 97% 97% 97%    Wt Readings from Last 3 Encounters:  03/26/13 69.1 kg (152 lb 5.4 oz)  03/26/13 69.1 kg (152 lb 5.4 oz)  03/26/13 69.1 kg (152 lb 5.4 oz)     Intake/Output Summary (Last 24 hours) at 03/27/13 1843 Last data filed at 03/27/13 1452  Gross per 24 hour  Intake    600 ml  Output   3500 ml  Net  -2900 ml    Exam  General: Well developed, well nourished, NAD, appears stated age  HEENT: NCAT, PERRLA, EOMI, Anicteic Sclera, mucous membranes moist. No pharyngeal erythema or exudates  Neck: Supple, no JVD, no masses  Cardiovascular: S1 S2 auscultated, no rubs, murmurs or gallops. Regular rate and rhythm.  Respiratory: Clear to auscultation bilaterally with equal chest rise  Abdomen: Soft, obese, tender, nondistended, + bowel sounds  Extremities: warm dry without cyanosis clubbing or edema  Neuro: AAOx3, cranial nerves grossly intact.   Skin: Without rashes exudates or nodules  Data Review   Micro Results No results found for this or any previous visit (from the past 240 hour(s)).  Radiology Reports Dg Chest 2 View  03/21/2013   CLINICAL DATA:  Fever. Short of breath. Chest pain. Abdominal pain. Cough. Congestion.  EXAM: CHEST  2 VIEW  COMPARISON:  03/16/2013.  FINDINGS: Increasing bilateral interstitial and airspace opacity compared to the prior exam. The cardiopericardial silhouette is mildly enlarged for projection. Mediastinal contours appear within normal limits. Thickening of the fissures is present  on the lateral view. The overall constellation of findings is most compatible with CHF/volume overload with interstitial and alveolar pulmonary edema. Superimposed pneumonia is difficult to exclude but not favored. There is no gross pleural fusion. Cholecystectomy clips are present in the right upper quadrant.  IMPRESSION: Constellation of findings most compatible with moderate CHF/volume overload.   Electronically Signed   By: Andreas Newport M.D.   On: 03/21/2013 18:50   Dg Chest 2 View  03/16/2013   CLINICAL DATA:  Shortness of breath  EXAM: CHEST  2 VIEW  COMPARISON:  02/19/2013  FINDINGS: The cardio pericardial silhouette is enlarged. There  is right base atelectasis or infiltrate. Small right pleural effusion associated. Imaged bony structures of the thorax are intact.  IMPRESSION: Right base atelectasis or pneumonia with small right pleural effusion.   Electronically Signed   By: Kennith Center M.D.   On: 03/16/2013 17:34   Ct Abdomen Pelvis W Contrast  03/16/2013   CLINICAL DATA:  Diffuse stabbing abdominal pain with nausea and vomiting for 2 days, chronic dialysis  EXAM: CT ABDOMEN AND PELVIS WITH CONTRAST  TECHNIQUE: Multidetector CT imaging of the abdomen and pelvis was performed using the standard protocol following bolus administration of intravenous contrast.  CONTRAST:  OMNIPAQUE IOHEXOL 300 MG/ML  SOLN  COMPARISON:  06/10/2012  FINDINGS: There is mild scattered bilateral hazy ground-glass attenuation with more focal dependent atelectasis bilaterally. There is a tiny left and small to moderate right pleural effusion.  There are no focal hepatic abnormalities. There is mild periportal edema. The spleen, pancreas, and adrenal glands are normal. There is severe bilateral renal atrophy and cystic change. 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 There is a nonobstructive bowel gas pattern with mild diffuse colon wall thickening. There is also mild  infiltration of the omentum and inflammatory change in the adjacent fat. These findings are similar to the prior study. The appendix is normal. Reproductive organs and bladder are normal. There is a small volume of ascites. Small transplanted kidney left flank unchanged. There are no acute musculoskeletal findings.  IMPRESSION: Diffuse colon wall thickening with mild infiltration of the omentum. The findings again suggest a diffuse colitis.   Electronically Signed   By: Esperanza Heir M.D.   On: 03/16/2013 17:45   Nm Pulmonary Perf And Vent  03/16/2013   CLINICAL DATA:  Shortness of breath  EXAM: NUCLEAR MEDICINE VENTILATION - PERFUSION LUNG SCAN  TECHNIQUE: Ventilation images were obtained in multiple projections using inhaled aerosol technetium 99 M DTPA. Perfusion images were obtained in multiple projections after intravenous injection of Tc-34m MAA.  COMPARISON:  02/27/2011.  RADIOPHARMACEUTICALS:  40 mCi Tc-44m DTPA aerosol and 6 mCi Tc-67m MAA  FINDINGS: Ventilation: No focal ventilation defect.  Perfusion: No wedge shaped peripheral perfusion defects to suggest acute pulmonary embolism.  IMPRESSION: Very low probability for pulmonary embolus. .   Electronically Signed   By: Kennith Center M.D.   On: 03/16/2013 17:21    CBC  Recent Labs Lab 03/21/13 1819 03/22/13 1200  03/23/13 0618 03/23/13 1925 03/24/13 1300 03/26/13 1615 03/27/13 1030  WBC 8.1 5.4  --   --   --  4.2 6.0  --   HGB 11.9* 10.3*  < > 10.6* 9.4* 9.7* 10.0* 11.1*  HCT 37.1 31.5*  < > 33.3* 30.3* 29.9* 31.4* 35.0*  PLT 134* 160  --   --   --  151 135*  --   MCV 106.0* 104.7*  --   --   --  103.8* 104.7*  --   MCH 34.0 34.2*  --   --   --  33.7 33.3  --   MCHC 32.1 32.7  --   --   --  32.4 31.8  --   RDW 17.2* 16.8*  --   --   --  16.3* 15.5  --   LYMPHSABS 1.1  --   --   --   --   --   --   --   MONOABS 0.4  --   --   --   --   --   --   --  EOSABS 0.1  --   --   --   --   --   --   --   BASOSABS 0.0  --   --   --    --   --   --   --   < > = values in this interval not displayed.  Chemistries   Recent Labs Lab 03/21/13 1819 03/22/13 1200 03/23/13 0618 03/23/13 0858 03/24/13 1300 03/26/13 1614  NA 133* 132* 136  --  131* 133*  K 5.8* 6.5* 4.9 3.8 4.3 4.6  CL 98 98 97  --  94* 98  CO2 20 20 25   --  22 23  GLUCOSE 122* 93 87  --  196* 233*  BUN 29* 38* 14  --  26* 21  CREATININE 7.62* 9.46* 5.74*  --  7.19* 5.51*  CALCIUM 8.3* 8.1* 8.1*  --  6.8* 7.2*  AST 33  --   --   --   --   --   ALT 17  --   --   --   --   --   ALKPHOS 125*  --   --   --   --   --   BILITOT 0.8  --   --   --   --   --    ------------------------------------------------------------------------------------------------------------------ estimated creatinine clearance is 12.1 ml/min (by C-G formula based on Cr of 5.51). ------------------------------------------------------------------------------------------------------------------ No results found for this basename: HGBA1C,  in the last 72 hours ------------------------------------------------------------------------------------------------------------------ No results found for this basename: CHOL, HDL, LDLCALC, TRIG, CHOLHDL, LDLDIRECT,  in the last 72 hours ------------------------------------------------------------------------------------------------------------------ No results found for this basename: TSH, T4TOTAL, FREET3, T3FREE, THYROIDAB,  in the last 72 hours ------------------------------------------------------------------------------------------------------------------ No results found for this basename: VITAMINB12, FOLATE, FERRITIN, TIBC, IRON, RETICCTPCT,  in the last 72 hours  Coagulation profile No results found for this basename: INR, PROTIME,  in the last 168 hours  No results found for this basename: DDIMER,  in the last 72 hours  Cardiac Enzymes  Recent Labs Lab 03/27/13 1030  TROPONINI <0.30    ------------------------------------------------------------------------------------------------------------------ No components found with this basename: POCBNP,

## 2013-03-27 NOTE — Progress Notes (Signed)
Subjective: 3 Days Post-Op Procedure(s) (LRB): FLEXIBLE SIGMOIDOSCOPY (N/A) Patient reports pain as 0 on 0-10 scale in right hand.  Doing well s/p carpal tunnel release.  No complaints.  Sensation improved.  She is looking forward to having left side done.  Objective: Vital signs in last 24 hours: Temp:  [97.6 F (36.4 C)-99.7 F (37.6 C)] 98.6 F (37 C) (10/31 1452) Pulse Rate:  [79-109] 79 (10/31 1452) Resp:  [18-20] 18 (10/31 1452) BP: (116-152)/(63-81) 137/81 mmHg (10/31 1452) SpO2:  [93 %-100 %] 97 % (10/31 1452) Weight:  [152 lb 5.4 oz (69.1 kg)] 152 lb 5.4 oz (69.1 kg) (10/30 2030)  Intake/Output from previous day: 10/30 0701 - 10/31 0700 In: 480 [P.O.:480] Out: 3500  Intake/Output this shift: Total I/O In: 360 [P.O.:360] Out: -    Recent Labs  03/26/13 1615 03/27/13 1030  HGB 10.0* 11.1*    Recent Labs  03/26/13 1615 03/27/13 1030  WBC 6.0  --   RBC 3.00*  --   HCT 31.4* 35.0*  PLT 135*  --     Recent Labs  03/26/13 1614  NA 133*  K 4.6  CL 98  CO2 23  BUN 21  CREATININE 5.51*  GLUCOSE 233*  CALCIUM 7.2*   No results found for this basename: LABPT, INR,  in the last 72 hours  intact sensation and capillary refill all digits.  +epl/fpl/io.  wound without erythema or drainage.  healing nicely.  Assessment/Plan: 3 Days Post-Op Procedure(s) (LRB): FLEXIBLE SIGMOIDOSCOPY (N/A) Sutures removed.  Doing well.  Follow up in office ~2-3 weeks.  Plan left carpal tunnel release as outpatient when medically stable to do so.  Kadon Andrus R 03/27/2013, 4:24 PM

## 2013-03-27 NOTE — Progress Notes (Signed)
Late Entry  At 1000 pt c/o throbbing pain in her right upper chest; states it's unlike any pain she's felt before. Vitals WNL. Dr. Blake Divine notified. EKG obtained, also WNL. Pt given nitro sublingual - states it was partially effective. New orders placed for CXR and labs. Dr. Blake Divine at bedside at 1050. Will continue to monitor.  Kathlene November, Haylo Fake Whiting

## 2013-03-27 NOTE — Progress Notes (Signed)
Subjective: Interval History: has complaints some bleeding from art site post HD.  Objective: Vital signs in last 24 hours: Temp:  [94.2 F (34.6 C)-99.7 F (37.6 C)] 99.4 F (37.4 C) (10/31 0503) Pulse Rate:  [68-129] 81 (10/31 0503) Resp:  [18-20] 20 (10/31 0503) BP: (94-152)/(53-82) 121/63 mmHg (10/31 0503) SpO2:  [93 %-100 %] 93 % (10/31 0503) Weight:  [69.1 kg (152 lb 5.4 oz)-72.3 kg (159 lb 6.3 oz)] 69.1 kg (152 lb 5.4 oz) (10/30 2030) Weight change: 1.902 kg (4 lb 3.1 oz)  Intake/Output from previous day: 10/30 0701 - 10/31 0700 In: 480 [P.O.:480] Out: 3500  Intake/Output this shift:    General appearance: alert and moderately obese Resp: clear to auscultation bilaterally Cardio: S1, S2 normal and systolic murmur: holosystolic 2/6, blowing at apex GI: obese, soft, pos bs Extremities: AVG R groin B&T  Lab Results:  Recent Labs  03/24/13 1300 03/26/13 1615  WBC 4.2 6.0  HGB 9.7* 10.0*  HCT 29.9* 31.4*  PLT 151 135*   BMET:  Recent Labs  03/24/13 1300 03/26/13 1614  NA 131* 133*  K 4.3 4.6  CL 94* 98  CO2 22 23  GLUCOSE 196* 233*  BUN 26* 21  CREATININE 7.19* 5.51*  CALCIUM 6.8* 7.2*   No results found for this basename: PTH,  in the last 72 hours Iron Studies: No results found for this basename: IRON, TIBC, TRANSFERRIN, FERRITIN,  in the last 72 hours  Studies/Results: No results found.  I have reviewed the patient's current medications.  Assessment/Plan: 1 ESRD rotate art site more prox.  Solute and vol ok 2 Anemia stable 3 DM controlled 4 Rectal bleed stable 5 HPTH P Can d/c home from our standpoint, will do HD here if still here.     LOS: 6 days   [name] L 03/27/2013,7:56 AM

## 2013-03-28 LAB — CBC
HCT: 31.1 % — ABNORMAL LOW (ref 36.0–46.0)
Hemoglobin: 9.9 g/dL — ABNORMAL LOW (ref 12.0–15.0)
MCH: 33.4 pg (ref 26.0–34.0)
MCHC: 31.8 g/dL (ref 30.0–36.0)
MCV: 105.1 fL — ABNORMAL HIGH (ref 78.0–100.0)
Platelets: 134 10*3/uL — ABNORMAL LOW (ref 150–400)
RBC: 2.96 MIL/uL — ABNORMAL LOW (ref 3.87–5.11)

## 2013-03-28 LAB — COMPREHENSIVE METABOLIC PANEL
ALT: 15 U/L (ref 0–35)
BUN: 24 mg/dL — ABNORMAL HIGH (ref 6–23)
CO2: 27 mEq/L (ref 19–32)
Calcium: 8 mg/dL — ABNORMAL LOW (ref 8.4–10.5)
Chloride: 97 mEq/L (ref 96–112)
Creatinine, Ser: 5.92 mg/dL — ABNORMAL HIGH (ref 0.50–1.10)
GFR calc Af Amer: 9 mL/min — ABNORMAL LOW (ref >90)
GFR calc non Af Amer: 8 mL/min — ABNORMAL LOW (ref >90)
Glucose, Bld: 229 mg/dL — ABNORMAL HIGH (ref 70–99)
Total Bilirubin: 0.4 mg/dL (ref 0.3–1.2)
Total Protein: 6.4 g/dL (ref 6.0–8.3)

## 2013-03-28 LAB — HEPATITIS B CORE ANTIBODY, IGM: Hep B C IgM: NONREACTIVE

## 2013-03-28 LAB — GLUCOSE, CAPILLARY

## 2013-03-28 MED ORDER — OXYCODONE HCL 5 MG PO TABS
ORAL_TABLET | ORAL | Status: AC
  Filled 2013-03-28: qty 1

## 2013-03-28 MED ORDER — DIPHENHYDRAMINE HCL 25 MG PO CAPS
ORAL_CAPSULE | ORAL | Status: AC
  Filled 2013-03-28: qty 2

## 2013-03-28 MED ORDER — HYDROCODONE-ACETAMINOPHEN 5-325 MG PO TABS: 1.0000 | ORAL_TABLET | Freq: Four times a day (QID) | ORAL | Status: DC | PRN

## 2013-03-28 MED ORDER — ONDANSETRON HCL 4 MG/2ML IJ SOLN
INTRAMUSCULAR | Status: AC
  Filled 2013-03-28: qty 2

## 2013-03-28 MED ORDER — CAMPHOR-MENTHOL 0.5-0.5 % EX LOTN
TOPICAL_LOTION | CUTANEOUS | Status: DC | PRN
  Filled 2013-03-28: qty 222

## 2013-03-28 MED ORDER — HYDROMORPHONE HCL PF 1 MG/ML IJ SOLN
INTRAMUSCULAR | Status: AC
  Filled 2013-03-28: qty 1

## 2013-03-28 NOTE — Progress Notes (Signed)
Patient was discharged home with friend. Patient was given prescriptions and instructions. Patient was given handout on diabetes education. Patient was told to follow up with doctors and to call doctor with questions and concerns. Patient was stable upon discharge.

## 2013-03-28 NOTE — Procedures (Signed)
I was present at this session.  I have reviewed the session itself and made appropriate changes.  Hd via R leg avf. bp & vol ok.   Access press ok.   Perris Tripathi L 11/1/20148:15 AM

## 2013-03-28 NOTE — Discharge Summary (Signed)
Physician Discharge Summary  [name] AVW:[id number] DOB: December 11, 1970 DOA: 03/21/2013  PCP: [name], MD  Admit date: 03/21/2013 Discharge date: 03/28/2013  Time spent: 28 minutes  Recommendations for Outpatient Follow-up:  1. Follow upwith PCP in one week 2. Follow upwith GI in 1 to 2 weeks.   Discharge Diagnoses:  Principal Problem:   Rectal bleeding Active Problems:   Abdominal pain   DM (diabetes mellitus), type 1 with renal complications   Hyperlipidemia   Immunosuppression   Colitis   Discharge Condition: improved.   Diet recommendation: carb modified diet  Filed Weights   03/27/13 2143 03/28/13 0720 03/28/13 1125  Weight: 71.5 kg (157 lb 10.1 oz) 72.3 kg (159 lb 6.3 oz) 68.7 kg (151 lb 7.3 oz)    History of present illness:  [name] is a 42 y.o. female who presents to the ED with complaints of rectal bleeding BRBPR X 3 and worsening of ABD pain since last night. She reports having increased stabbing lower ABD Pain and rates the pain as an 8/1`0. She presented to dialysis and was not dialyzed and instead she was sent to the ED due to her symptoms. She was discharged yesterday 03/20/2013 after being hospitalized for Coliits, and was to undergo a sigmoidoscopy as an outpatient.    Hospital Course:  Rectal Bleeding  -Possibe secondary to colitis, which may be secondary to cellcept.  -Sigmoidoscopy DONE ON 10/28 and Biopsy results normal.  - H&H stable.  - rectal bleeding stopped. As per GI Recommendations, if she has recurrent bleeding or abd pain, she will need a colonoscopy as outpatient.  Anemia  -Secondary to renal disease and rectal bleeding.  -Patient currently hemodynamically stable.   H/H stable.  Intractable Abdominal pain, Acute on chronic  - resolved on discharged. If still symptomatic after discharge, follow up with GI and she might need colonoscopy outpatient.  End stage renal disease, requiring hemodialysis  -Nephrology  consulted  Hypoglycemia with Diabetes mellitus, Type 1  -- resolved. Encouraged pt to eat small meals multiple times, and check blood sugar 4 times a day.  - resume home medications.  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  Substernal chest pain: EKG unchanged from before, troponin negative, releived with nitrate. CXR negative for pneumonia or acute pathology.    Procedures:  none  Consultations:  gastroenterology  Discharge Exam: Filed Vitals:   03/28/13 1125  BP: 118/66  Pulse: 79  Temp: 98.1 F (36.7 C)  Resp: 18  alert afebrile comfortable.  Cardiovascular: S1 S2 auscultated, no rubs, murmurs or gallops. Regular rate and rhythm.  Respiratory: Clear to auscultation bilaterally with equal chest rise  Abdomen: Soft, obese, tender, nondistended, + bowel sounds  Extremities: warm dry without cyanosis clubbing or edema  Discharge Instructions  Discharge Orders   Future Orders Complete By Expires   Diet - low sodium heart healthy  As directed    Diet - low sodium heart healthy  As directed    Discharge instructions  As directed    Comments:     Follow up with PCP in one week Please check cbc and bmp in one week.   Discharge instructions  As directed    Comments:     Follow up with  PCP in one week Follow up with Gastroenterology in 1 to weeks.       Medication List    STOP taking these medications       aspirin EC 81 MG tablet      TAKE these medications       albuterol 108 (90 BASE) MCG/ACT inhaler  Commonly known as:  PROVENTIL HFA;VENTOLIN HFA  Inhale 2 puffs into the lungs every 4 (four) hours as needed for wheezing or shortness of breath (as per home regimen).     calcium acetate 667 MG capsule  Commonly known as:  PHOSLO  Take 2,001-2,668 mg  by mouth 3 (three) times daily with meals. Dosage depends on meal consumed     clonazePAM 0.5 MG tablet  Commonly known as:  KLONOPIN  Take 1 mg by mouth at bedtime.     diphenhydrAMINE 50 MG tablet  Commonly known as:  BENADRYL  Take 50 mg by mouth at bedtime as needed for allergies.     EXFORGE 10-160 MG per tablet  Generic drug:  amLODipine-valsartan  Take 1 tablet by mouth at bedtime.     feeding supplement (NEPRO CARB STEADY) Liqd  Take 237 mLs by mouth daily.     gabapentin 100 MG capsule  Commonly known as:  NEURONTIN  Take 100 mg by mouth daily.     HYDROcodone-acetaminophen 5-325 MG per tablet  Commonly known as:  NORCO  Take 1 tablet by mouth every 6 (six) hours as needed for pain.     insulin glargine 100 UNIT/ML injection  Commonly known as:  LANTUS  Inject 0.07 mLs (7 Units total) into the skin 2 (two) times daily.     metoprolol succinate 100 MG 24 hr tablet  Commonly known as:  TOPROL-XL  Take 100 mg by mouth at bedtime. Take with or immediately following a meal.     multivitamin Tabs tablet  Take 1 tablet by mouth daily.     mycophenolate 250 MG capsule  Commonly known as:  CELLCEPT  Take 250 mg by mouth 2 (two) times daily.     pantoprazole 40 MG tablet  Commonly known as:  PROTONIX  Take 1 tablet (40 mg total) by mouth at bedtime.     predniSONE 5 MG tablet  Commonly known as:  DELTASONE  Take 5 mg by mouth daily.     tacrolimus 1 MG capsule  Commonly known as:  PROGRAF  Take 1 mg by mouth 2 (two) times daily.       Allergies  Allergen Reactions  . Depakote [Divalproex Sodium] Other (See Comments)    Pt gets "delirious"  . Morphine And Related Nausea And Vomiting and Other (See Comments)    "makes me delirious"  . Penicillins Anaphylaxis  . Tramadol Nausea And Vomiting  . Vicodin [Hydrocodone-Acetaminophen] Itching and Rash       Follow-up Information   Follow up with [name], [name], MD.   Specialty:  Family Medicine    Contact information:   [address] [place] [address] [phone]       Follow up with [facility], [name], MD. Schedule an appointment as soon as possible for a visit in 1 week.   Specialty:  Family Medicine   Contact information:   [address] [place] [address] [phone]       Follow up with Florencia Reasons, MD. Schedule an appointment as soon as possible for a visit in 1 week.  Specialty:  Gastroenterology   Contact information:   [address] [place] [address] [phone]        The results of significant diagnostics from this hospitalization (including imaging, microbiology, ancillary and laboratory) are listed below for reference.    Significant Diagnostic Studies: Dg Chest 2 View  03/21/2013   CLINICAL DATA:  Fever. Short of breath. Chest pain. Abdominal pain. Cough. Congestion.  EXAM: CHEST  2 VIEW  COMPARISON:  03/16/2013.  FINDINGS: Increasing bilateral interstitial and airspace opacity compared to the prior exam. The cardiopericardial silhouette is mildly enlarged for projection. Mediastinal contours appear within normal limits. Thickening of the fissures is present on the lateral view. The overall constellation of findings is most compatible with CHF/volume overload with interstitial and alveolar pulmonary edema. Superimposed pneumonia is difficult to exclude but not favored. There is no gross pleural fusion. Cholecystectomy clips are present in the right upper quadrant.  IMPRESSION: Constellation of findings most compatible with moderate CHF/volume overload.   Electronically Signed   By: Andreas [place] M.D.   On: 03/21/2013 18:50   Dg Chest 2 View  03/16/2013   CLINICAL DATA:  Shortness of breath  EXAM: CHEST  2 VIEW  COMPARISON:  02/19/2013  FINDINGS: The cardio pericardial silhouette is enlarged. There is right base atelectasis or infiltrate. Small right pleural effusion associated. Imaged bony  structures of the thorax are intact.  IMPRESSION: Right base atelectasis or pneumonia with small right pleural effusion.   Electronically Signed   By: Kennith Center M.D.   On: 03/16/2013 17:34   Ct Head Wo Contrast  03/23/2013   CLINICAL DATA:  Lethargy.  EXAM: CT HEAD WITHOUT CONTRAST  TECHNIQUE: Contiguous axial images were obtained from the base of the skull through the vertex without intravenous contrast.  COMPARISON:  01/04/2013  FINDINGS: No mass lesion. No midline shift. No acute hemorrhage or hematoma. No extra-axial fluid collections. No evidence of acute infarction. There benign calcifications in the basal ganglia and cerebellar hemispheres, stable. Tiny old lacunar infarct in left basal ganglia, unchanged. No osseous abnormality.  IMPRESSION: No acute abnormalities.   Electronically Signed   By: Geanie Cooley M.D.   On: 03/23/2013 15:11   Ct Abdomen Pelvis W Contrast  03/16/2013   CLINICAL DATA:  Diffuse stabbing abdominal pain with nausea and vomiting for 2 days, chronic dialysis  EXAM: CT ABDOMEN AND PELVIS WITH CONTRAST  TECHNIQUE: Multidetector CT imaging of the abdomen and pelvis was performed using the standard protocol following bolus administration of intravenous contrast.  CONTRAST:  OMNIPAQUE IOHEXOL 300 MG/ML  SOLN  COMPARISON:  06/10/2012  FINDINGS: There is mild scattered bilateral hazy ground-glass attenuation with more focal dependent atelectasis bilaterally. There is a tiny left and small to moderate right pleural effusion.  There are no focal hepatic abnormalities. There is mild periportal edema. The spleen, pancreas, and adrenal glands are normal. There is severe bilateral renal atrophy and cystic change. 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 There is a nonobstructive bowel gas pattern with mild diffuse colon wall thickening. There is also mild infiltration of the omentum and inflammatory change in the adjacent fat. These findings are  similar to the prior study. The appendix is normal. Reproductive organs and bladder are normal. There is a small volume of ascites. Small transplanted kidney left flank unchanged. There are no acute musculoskeletal findings.  IMPRESSION: Diffuse colon wall thickening with mild infiltration of the omentum. The findings again suggest a  diffuse colitis.   Electronically Signed   By: Esperanza Heir M.D.   On: 03/16/2013 17:45   Nm Pulmonary Perf And Vent  03/16/2013   CLINICAL DATA:  Shortness of breath  EXAM: NUCLEAR MEDICINE VENTILATION - PERFUSION LUNG SCAN  TECHNIQUE: Ventilation images were obtained in multiple projections using inhaled aerosol technetium 99 M DTPA. Perfusion images were obtained in multiple projections after intravenous injection of Tc-49m MAA.  COMPARISON:  02/27/2011.  RADIOPHARMACEUTICALS:  40 mCi Tc-38m DTPA aerosol and 6 mCi Tc-86m MAA  FINDINGS: Ventilation: No focal ventilation defect.  Perfusion: No wedge shaped peripheral perfusion defects to suggest acute pulmonary embolism.  IMPRESSION: Very low probability for pulmonary embolus. .   Electronically Signed   By: Kennith Center M.D.   On: 03/16/2013 17:21   Dg Chest Port 1 View  03/27/2013   CLINICAL DATA:  Right-sided chest pain and cough  EXAM: PORTABLE CHEST - 1 VIEW  COMPARISON:  03/21/2013  FINDINGS: There is moderate cardiomegaly with a few interstitial Kerley B-lines and trace pleural effusions. Central vascular congestion noted. No focal pulmonary opacity. No acute osseous finding.  IMPRESSION: Cardiomegaly with central vascular congestion and probable trace interstitial edema.   Electronically Signed   By: Christiana Pellant M.D.   On: 03/27/2013 19:08    Microbiology: No results found for this or any previous visit (from the past 240 hour(s)).   Labs: Basic Metabolic Panel:  Recent Labs Lab 03/22/13 1200 03/23/13 0618 03/23/13 0858 03/24/13 1300 03/26/13 1614 03/28/13 0500  NA 132* 136  --  131* 133* 135   K 6.5* 4.9 3.8 4.3 4.6 4.4  CL 98 97  --  94* 98 97  CO2 20 25  --  22 23 27   GLUCOSE 93 87  --  196* 233* 229*  BUN 38* 14  --  26* 21 24*  CREATININE 9.46* 5.74*  --  7.19* 5.51* 5.92*  CALCIUM 8.1* 8.1*  --  6.8* 7.2* 8.0*  PHOS  --   --   --  6.1* 4.0 3.0   Liver Function Tests:  Recent Labs Lab 03/21/13 1819 03/24/13 1300 03/26/13 1614 03/28/13 0500  AST 33  --   --  21  ALT 17  --   --  15  ALKPHOS 125*  --   --  112  BILITOT 0.8  --   --  0.4  PROT 6.9  --   --  6.4  ALBUMIN 3.3* 2.9* 3.0* 3.0*    Recent Labs Lab 03/21/13 1819  LIPASE 45   No results found for this basename: AMMONIA,  in the last 168 hours CBC:  Recent Labs Lab 03/21/13 1819 03/22/13 1200  03/23/13 1925 03/24/13 1300 03/26/13 1615 03/27/13 1030 03/28/13 0500  WBC 8.1 5.4  --   --  4.2 6.0  --  5.8  NEUTROABS 6.4  --   --   --   --   --   --   --   HGB 11.9* 10.3*  < > 9.4* 9.7* 10.0* 11.1* 9.9*  HCT 37.1 31.5*  < > 30.3* 29.9* 31.4* 35.0* 31.1*  MCV 106.0* 104.7*  --   --  103.8* 104.7*  --  105.1*  PLT 134* 160  --   --  151 135*  --  134*  < > = values in this interval not displayed. Cardiac Enzymes:  Recent Labs Lab 03/27/13 1030  TROPONINI <0.30   BNP: BNP (last 3 results) No results found  for this basename: PROBNP,  in the last 8760 hours CBG:  Recent Labs Lab 03/27/13 1146 03/27/13 1220 03/27/13 1317 03/27/13 1640 03/27/13 2148  GLUCAP 55* 65* 166* 326* 181*       Signed:  Delesa Kawa  [facility] 03/28/2013, 1:24 PM

## 2013-03-28 NOTE — Progress Notes (Signed)
Subjective:  Seen on dialysis,still with generalized abdominal pain, no dyspnea  Objective: Vital signs in last 24 hours: Temp:  [97.6 F (36.4 C)-98.6 F (37 C)] 97.7 F (36.5 C) (11/01 0720) Pulse Rate:  [69-88] 69 (11/01 0730) Resp:  [18-20] 18 (11/01 0720) BP: (96-163)/(55-84) 96/55 mmHg (11/01 0730) SpO2:  [97 %-100 %] 98 % (11/01 0720) Weight:  [71.5 kg (157 lb 10.1 oz)-72.3 kg (159 lb 6.3 oz)] 72.3 kg (159 lb 6.3 oz) (11/01 0720) Weight change: -0.8 kg (-1 lb 12.2 oz)  Intake/Output from previous day: 10/31 0701 - 11/01 0700 In: 360 [P.O.:360] Out: -    EXAM: General appearance:  Alert, in no apparent distress Resp:  CTA without rales, rhonchi, or wheezes Cardio:  RRR with Gr II/VI systolic murmur, no rub GI:  + BS, soft with diffuse tenderness  Obese. Liver down 4 cm Extremities:  No edema Access:  Right thigh AVG with BFR 400  Lab Results:  Recent Labs  03/26/13 1615 03/27/13 1030 03/28/13 0500  WBC 6.0  --  5.8  HGB 10.0* 11.1* 9.9*  HCT 31.4* 35.0* 31.1*  PLT 135*  --  134*   BMET:  Recent Labs  03/26/13 1614  NA 133*  K 4.6  CL 98  CO2 23  GLUCOSE 233*  BUN 21  CREATININE 5.51*  CALCIUM 7.2*  ALBUMIN 3.0*   No results found for this basename: PTH,  in the last 72 hours Iron Studies: No results found for this basename: IRON, TIBC, TRANSFERRIN, FERRITIN,  in the last 72 hours  Dialysis Orders:  TTS @ [facility] 3:45    68.5 kg    400/A1.5   2K/2.5Ca    R thigh AVG     Heparin 5000 U Hectorol 1 mcg    Epogen 10,000 U      Venofer 0  Assessment/Plan: 1. Abdominal pain - rectal bleeding resolved, s/p sigmoidoscopy 10/28, biopsy results normal; Hgb stable. 2. ESRD - HD on TTS @ [facility]; K 4.6.  HD today. 3. HTN/Volume - BP 115/47 on Amlodipine 10 mg qhs, Metoprolol 100 mg qhs, Irbesartan 150 mg qhs; pre-HD wt 72.3 kg with EDW 68.5.  UF goal of 3.5 L today. See if can lessen med burden 4. Anemia - Hgb down to 9.9, s/p Aranesp 100 mcg on Thurs.   5. Sec HPT -  Ca 8 (8.8 corrected), P 3, last iPTH 213; Hectorol 1 mcg, Phoslo 3 with meals. 6. Nutrition - Alb 3, renal diet, vitamin. 7. DM - on insulin per primary. Ok to D/c from out standpoint    LOS: 7 days   LYLES,CHARLES 03/28/2013,8:09 AM I have seen and examined this patient and agree with the plan of care seen, examined, eval and counseled .  Roel Douthat L 03/28/2013, 11:59 AM

## 2013-03-31 ENCOUNTER — Encounter (HOSPITAL_COMMUNITY): Payer: Self-pay | Admitting: Pharmacy Technician

## 2013-04-01 ENCOUNTER — Encounter (HOSPITAL_COMMUNITY): Payer: Self-pay | Admitting: *Deleted

## 2013-04-01 MED ORDER — VANCOMYCIN HCL IN DEXTROSE 1-5 GM/200ML-% IV SOLN
1000.0000 mg | INTRAVENOUS | Status: AC
  Administered 2013-04-02: 1000 mg via INTRAVENOUS
  Filled 2013-04-01: qty 200

## 2013-04-01 MED ORDER — SODIUM CHLORIDE 0.9 % IV SOLN
INTRAVENOUS | Status: DC
  Administered 2013-04-02: 13:00:00 via INTRAVENOUS

## 2013-04-01 NOTE — Progress Notes (Signed)
Spoke with Darel Hong at Dr. Edilia Bo office regarding pt special instructions to arrive at 5:30AM for labs and pt instructions to be NPO with DM and surgery scheduled for 1447. According to Darel Hong, pt should arrive at 10:00AM and is allowed to eat up until 7:00AM on the morning of surgery; instructions read back and verified.Pt advised to stop taking Aspirin, and herbal medications. Do not take any NSAIDs ie: Ibuprofen, Advil, Naproxen or any medication containing Aspirin.

## 2013-04-02 ENCOUNTER — Ambulatory Visit (HOSPITAL_COMMUNITY): Payer: Medicare Other | Admitting: Certified Registered"

## 2013-04-02 ENCOUNTER — Encounter (HOSPITAL_COMMUNITY): Payer: Medicare Other | Admitting: Certified Registered"

## 2013-04-02 ENCOUNTER — Encounter (HOSPITAL_COMMUNITY): Payer: Self-pay | Admitting: *Deleted

## 2013-04-02 ENCOUNTER — Ambulatory Visit (HOSPITAL_COMMUNITY)
Admission: RE | Admit: 2013-04-02 | Discharge: 2013-04-02 | Disposition: A | Discharge status: Home / Self Care | Payer: Medicare Other | Source: Ambulatory Visit | Attending: Vascular Surgery | Admitting: Vascular Surgery

## 2013-04-02 ENCOUNTER — Encounter (HOSPITAL_COMMUNITY)
Admission: RE | Disposition: A | Discharge status: Home / Self Care | Payer: Self-pay | Source: Ambulatory Visit | Attending: Vascular Surgery

## 2013-04-02 DIAGNOSIS — I724 Aneurysm of artery of lower extremity: Secondary | ICD-10-CM | POA: Insufficient documentation

## 2013-04-02 DIAGNOSIS — K219 Gastro-esophageal reflux disease without esophagitis: Secondary | ICD-10-CM | POA: Insufficient documentation

## 2013-04-02 DIAGNOSIS — J4489 Other specified chronic obstructive pulmonary disease: Secondary | ICD-10-CM | POA: Insufficient documentation

## 2013-04-02 DIAGNOSIS — Y832 Surgical operation with anastomosis, bypass or graft as the cause of abnormal reaction of the patient, or of later complication, without mention of misadventure at the time of the procedure: Secondary | ICD-10-CM | POA: Insufficient documentation

## 2013-04-02 DIAGNOSIS — E119 Type 2 diabetes mellitus without complications: Secondary | ICD-10-CM | POA: Insufficient documentation

## 2013-04-02 DIAGNOSIS — T82898A Other specified complication of vascular prosthetic devices, implants and grafts, initial encounter: Secondary | ICD-10-CM | POA: Insufficient documentation

## 2013-04-02 DIAGNOSIS — I120 Hypertensive chronic kidney disease with stage 5 chronic kidney disease or end stage renal disease: Secondary | ICD-10-CM | POA: Insufficient documentation

## 2013-04-02 DIAGNOSIS — N186 End stage renal disease: Secondary | ICD-10-CM | POA: Insufficient documentation

## 2013-04-02 DIAGNOSIS — Z992 Dependence on renal dialysis: Secondary | ICD-10-CM | POA: Insufficient documentation

## 2013-04-02 DIAGNOSIS — J449 Chronic obstructive pulmonary disease, unspecified: Secondary | ICD-10-CM | POA: Insufficient documentation

## 2013-04-02 HISTORY — DX: Unspecified abdominal pain: R10.9

## 2013-04-02 HISTORY — PX: REVISION OF ARTERIOVENOUS GORETEX GRAFT: SHX6073

## 2013-04-02 LAB — GLUCOSE, CAPILLARY: Glucose-Capillary: 454 mg/dL — ABNORMAL HIGH (ref 70–99)

## 2013-04-02 LAB — HCG, SERUM, QUALITATIVE: Preg, Serum: NEGATIVE

## 2013-04-02 LAB — POCT I-STAT 4, (NA,K, GLUC, HGB,HCT)
Potassium: 4.3 mEq/L (ref 3.5–5.1)
Sodium: 132 mEq/L — ABNORMAL LOW (ref 135–145)

## 2013-04-02 SURGERY — REVISION OF ARTERIOVENOUS GORETEX GRAFT
Anesthesia: Monitor Anesthesia Care | Site: Thigh | Laterality: Right | Wound class: Clean

## 2013-04-02 MED ORDER — HYDROMORPHONE HCL PF 1 MG/ML IJ SOLN
0.2500 mg | INTRAMUSCULAR | Status: DC | PRN
  Administered 2013-04-02 (×4): 0.5 mg via INTRAVENOUS

## 2013-04-02 MED ORDER — LIDOCAINE-EPINEPHRINE (PF) 1 %-1:200000 IJ SOLN
INTRAMUSCULAR | Status: AC
  Filled 2013-04-02: qty 10

## 2013-04-02 MED ORDER — OXYCODONE HCL 5 MG PO TABS
5.0000 mg | ORAL_TABLET | Freq: Once | ORAL | Status: AC | PRN
  Administered 2013-04-02: 5 mg via ORAL

## 2013-04-02 MED ORDER — HYDROMORPHONE HCL PF 1 MG/ML IJ SOLN
INTRAMUSCULAR | Status: AC
  Administered 2013-04-02: 0.5 mg
  Filled 2013-04-02: qty 1

## 2013-04-02 MED ORDER — PROMETHAZINE HCL 25 MG/ML IJ SOLN
INTRAMUSCULAR | Status: AC
  Filled 2013-04-02: qty 1

## 2013-04-02 MED ORDER — HEPARIN SODIUM (PORCINE) 1000 UNIT/ML IJ SOLN
INTRAMUSCULAR | Status: DC | PRN
  Administered 2013-04-02: 5000 [IU] via INTRAVENOUS

## 2013-04-02 MED ORDER — LIDOCAINE-EPINEPHRINE (PF) 1 %-1:200000 IJ SOLN
INTRAMUSCULAR | Status: DC | PRN
  Administered 2013-04-02: 30 mL

## 2013-04-02 MED ORDER — INSULIN ASPART 100 UNIT/ML ~~LOC~~ SOLN: 10.0000 [IU] | Freq: Once | SUBCUTANEOUS | Status: DC

## 2013-04-02 MED ORDER — 0.9 % SODIUM CHLORIDE (POUR BTL) OPTIME
TOPICAL | Status: DC | PRN
  Administered 2013-04-02: 1000 mL

## 2013-04-02 MED ORDER — MIDAZOLAM HCL 5 MG/5ML IJ SOLN
INTRAMUSCULAR | Status: DC | PRN
  Administered 2013-04-02: 2 mg via INTRAVENOUS

## 2013-04-02 MED ORDER — HYDROMORPHONE HCL PF 1 MG/ML IJ SOLN
INTRAMUSCULAR | Status: AC
  Filled 2013-04-02: qty 1

## 2013-04-02 MED ORDER — PROPOFOL INFUSION 10 MG/ML OPTIME
INTRAVENOUS | Status: DC | PRN
  Administered 2013-04-02: 100 ug/kg/min via INTRAVENOUS

## 2013-04-02 MED ORDER — OXYCODONE HCL 5 MG PO TABS
ORAL_TABLET | ORAL | Status: AC
  Filled 2013-04-02: qty 1

## 2013-04-02 MED ORDER — THROMBIN 20000 UNITS EX SOLR
CUTANEOUS | Status: AC
  Filled 2013-04-02: qty 20000

## 2013-04-02 MED ORDER — OXYCODONE HCL 5 MG/5ML PO SOLN: 5.0000 mg | Freq: Once | ORAL | Status: AC | PRN

## 2013-04-02 MED ORDER — MEPERIDINE HCL 25 MG/ML IJ SOLN: 6.2500 mg | INTRAMUSCULAR | Status: DC | PRN

## 2013-04-02 MED ORDER — FENTANYL CITRATE 0.05 MG/ML IJ SOLN
INTRAMUSCULAR | Status: DC | PRN
  Administered 2013-04-02: 50 ug via INTRAVENOUS
  Administered 2013-04-02: 100 ug via INTRAVENOUS

## 2013-04-02 MED ORDER — PROTAMINE SULFATE 10 MG/ML IV SOLN
INTRAVENOUS | Status: DC | PRN
  Administered 2013-04-02: 25 mg via INTRAVENOUS

## 2013-04-02 MED ORDER — ONDANSETRON HCL 4 MG/2ML IJ SOLN: 4.0000 mg | Freq: Once | INTRAMUSCULAR | Status: DC | PRN

## 2013-04-02 MED ORDER — PROMETHAZINE HCL 25 MG/ML IJ SOLN
6.2500 mg | INTRAMUSCULAR | Status: DC | PRN
  Administered 2013-04-02: 6.25 mg via INTRAVENOUS

## 2013-04-02 MED ORDER — HYDROCODONE-ACETAMINOPHEN 5-325 MG PO TABS: 1.0000 | ORAL_TABLET | Freq: Four times a day (QID) | ORAL | Status: DC | PRN

## 2013-04-02 MED ORDER — LIDOCAINE HCL (PF) 1 % IJ SOLN
INTRAMUSCULAR | Status: AC
  Filled 2013-04-02: qty 30

## 2013-04-02 MED ORDER — SODIUM CHLORIDE 0.9 % IR SOLN
Status: DC | PRN
  Administered 2013-04-02: 13:00:00

## 2013-04-02 SURGICAL SUPPLY — 36 items
CANISTER SUCTION 2500CC (MISCELLANEOUS) ×2 IMPLANT
CLIP TI MEDIUM 6 (CLIP) ×2 IMPLANT
CLIP TI WIDE RED SMALL 6 (CLIP) ×2 IMPLANT
COVER SURGICAL LIGHT HANDLE (MISCELLANEOUS) ×2 IMPLANT
DECANTER SPIKE VIAL GLASS SM (MISCELLANEOUS) IMPLANT
DERMABOND ADVANCED (GAUZE/BANDAGES/DRESSINGS) ×1
DERMABOND ADVANCED .7 DNX12 (GAUZE/BANDAGES/DRESSINGS) ×1 IMPLANT
DRAPE ORTHO SPLIT 77X108 STRL (DRAPES) ×1
DRAPE SURG ORHT 6 SPLT 77X108 (DRAPES) ×1 IMPLANT
ELECT REM PT RETURN 9FT ADLT (ELECTROSURGICAL) ×2
ELECTRODE REM PT RTRN 9FT ADLT (ELECTROSURGICAL) ×1 IMPLANT
GLOVE BIO SURGEON STRL SZ 6.5 (GLOVE) ×4 IMPLANT
GLOVE BIO SURGEON STRL SZ7.5 (GLOVE) ×2 IMPLANT
GLOVE BIOGEL PI IND STRL 6.5 (GLOVE) ×1 IMPLANT
GLOVE BIOGEL PI IND STRL 7.0 (GLOVE) ×1 IMPLANT
GLOVE BIOGEL PI IND STRL 8 (GLOVE) ×1 IMPLANT
GLOVE BIOGEL PI INDICATOR 6.5 (GLOVE) ×1
GLOVE BIOGEL PI INDICATOR 7.0 (GLOVE) ×1
GLOVE BIOGEL PI INDICATOR 8 (GLOVE) ×1
GLOVE ECLIPSE 6.5 STRL STRAW (GLOVE) ×2 IMPLANT
GOWN STRL NON-REIN LRG LVL3 (GOWN DISPOSABLE) ×6 IMPLANT
KIT BASIN OR (CUSTOM PROCEDURE TRAY) ×2 IMPLANT
KIT ROOM TURNOVER OR (KITS) ×2 IMPLANT
NEEDLE HYPO 25GX1X1/2 BEV (NEEDLE) ×2 IMPLANT
NS IRRIG 1000ML POUR BTL (IV SOLUTION) ×2 IMPLANT
PACK CV ACCESS (CUSTOM PROCEDURE TRAY) ×2 IMPLANT
PAD ARMBOARD 7.5X6 YLW CONV (MISCELLANEOUS) ×4 IMPLANT
SPONGE SURGIFOAM ABS GEL 100 (HEMOSTASIS) IMPLANT
SUT PROLENE 6 0 BV (SUTURE) ×4 IMPLANT
SUT VIC AB 3-0 SH 27 (SUTURE) ×1
SUT VIC AB 3-0 SH 27X BRD (SUTURE) ×1 IMPLANT
SUT VICRYL 4-0 PS2 18IN ABS (SUTURE) ×2 IMPLANT
TOWEL OR 17X24 6PK STRL BLUE (TOWEL DISPOSABLE) ×2 IMPLANT
TOWEL OR 17X26 10 PK STRL BLUE (TOWEL DISPOSABLE) ×2 IMPLANT
UNDERPAD 30X30 INCONTINENT (UNDERPADS AND DIAPERS) ×2 IMPLANT
WATER STERILE IRR 1000ML POUR (IV SOLUTION) ×2 IMPLANT

## 2013-04-02 NOTE — Transfer of Care (Signed)
Immediate Anesthesia Transfer of Care Note  Patient: [name]  Procedure(s) Performed: Procedure(s): REPAIR OF PSEUDOANEURYSM OF ARTERIOVENOUS GORETEX GRAFT (Right)  Patient Location: PACU  Anesthesia Type:MAC  Level of Consciousness: awake, alert , oriented and patient cooperative  Airway & Oxygen Therapy: Patient Spontanous Breathing and Patient connected to face mask oxygen  Post-op Assessment: Report given to PACU RN, Post -op Vital signs reviewed and stable and Patient moving all extremities  Post vital signs: Reviewed and stable  Complications: No apparent anesthesia complications

## 2013-04-02 NOTE — Preoperative (Signed)
Beta Blockers   Reason not to administer Beta Blockers:Not Applicable 

## 2013-04-02 NOTE — Progress Notes (Signed)
Pt cont to c/o 9/10 pain and bs=454 consulted dr Michelle Piper additional pain med ordered and given pt instructed to treat bs with s/s insulin when gets home pt would rather wait to get home because of bottoming out.ok with dr Michelle Piper

## 2013-04-02 NOTE — Progress Notes (Signed)
Notified Dr. Michelle Piper of glucose 378, he ordered 10 units insulin but patient stated that would bottom her out.  Per Dr. Michelle Piper we will not give insulin.  Dr. Michelle Piper also saw 1 view xray 10/31 and stated was okay.

## 2013-04-02 NOTE — Interval H&P Note (Signed)
History and Physical Interval Note:  04/02/2013 12:26 PM  [name]  has presented today for surgery, with the diagnosis of Pseudoaneurysm of Right Thigh AVG End Stage Renal Disease  The various methods of treatment have been discussed with the patient and family. After consideration of risks, benefits and other options for treatment, the patient has consented to  Procedure(s): REPAIR OF PSEUDOANEURYSM OF ARTERIOVENOUS GORETEX GRAFT (Right) as a surgical intervention .  The patient's history has been reviewed, patient examined, no change in status, stable for surgery.  I have reviewed the patient's chart and labs.  Questions were answered to the patient's satisfaction.     DICKSON,CHRISTOPHER S

## 2013-04-02 NOTE — Anesthesia Procedure Notes (Signed)
Procedure Name: MAC Date/Time: 04/02/2013 12:44 PM Performed by: Jerilee Hoh Pre-anesthesia Checklist: Patient identified, Emergency Drugs available, Suction available and Patient being monitored Patient Re-evaluated:Patient Re-evaluated prior to inductionOxygen Delivery Method: Simple face mask Intubation Type: IV induction Placement Confirmation: positive ETCO2 and breath sounds checked- equal and bilateral

## 2013-04-02 NOTE — Anesthesia Preprocedure Evaluation (Addendum)
Anesthesia Evaluation  Patient identified by MRN, date of birth, ID band Patient awake    Reviewed: Allergy & Precautions, H&P , NPO status , Patient's Chart, lab work & pertinent test results, reviewed documented beta blocker date and time   History of Anesthesia Complications (+) PONV  Airway       Dental   Pulmonary COPD         Cardiovascular hypertension, Pt. on medications     Neuro/Psych    GI/Hepatic GERD-  Medicated and Controlled,  Endo/Other  diabetes, Type 2  Renal/GU Dialysis and CRFRenal disease     Musculoskeletal   Abdominal   Peds  Hematology   Anesthesia Other Findings   Reproductive/Obstetrics                          Anesthesia Physical Anesthesia Plan  ASA: III  Anesthesia Plan: MAC   Post-op Pain Management:    Induction: Intravenous  Airway Management Planned:   Additional Equipment:   Intra-op Plan:   Post-operative Plan:   Informed Consent:   Plan Discussed with:   Anesthesia Plan Comments:         Anesthesia Quick Evaluation

## 2013-04-02 NOTE — Op Note (Signed)
NAME: [name]   MRN: [id number] DOB: 05-19-1971    DATE OF OPERATION: 04/02/2013  PREOP DIAGNOSIS: pseudoaneurysm of right thigh AV graft  POSTOP DIAGNOSIS: same  PROCEDURE: repair of pseudoaneurysm of right thigh AV graft  SURGEON: Di Kindle. Edilia Bo, MD, FACS  ASSIST: Doreatha Massed, PA  ANESTHESIA: local with sedation   EBL: minimal  INDICATIONS: [name] is a 42 y.o. female who developed a small pseudoaneurysm along the arterial aspect of her right thigh AV graft.  FINDINGS: the patient had a small pseudoaneurysm with no evidence of infection present here.  TECHNIQUE: Patient was taken to the operating room and sedated by anesthesia. The right thigh was prepped and draped in the usual sterile fashion. A transverse incision was made over the pseudoaneurysm after the skin was anesthetized. Graft proximal and distal to the aneurysm was dissected free. The patient received 5000 units of IV heparin. The graft was clamped proximally and distally. The pseudoaneurysm was sharply excised. The hole in the graft was closed transversely with a running 6-0 Prolene suture. Clamps were then released and there was a good thrill in the graft. Heparin was partially reversed with protamine. The wounds closed the deep layer 3-0 Vicryl the skin closed with 4-0 Vicryl. Dermabond was applied. The patient tolerated the procedure well and was transferred to the recovery room in stable condition. All needle and sponge counts were correct.  Waverly Ferrari, MD, FACS [facility]  DATE OF DICTATION:   04/02/2013

## 2013-04-02 NOTE — H&P (View-Only) (Signed)
  VASCULAR AND VEIN SURGERY PROGRESS NOTE  POST-OP HEMODIALYSIS ACCESS  Date of Surgery: 03/14/12 Placement of  right thigh AV graft Surgeon: CSD    HPI: [name] is a 42 y.o. female who is 1 year post creation/revision of right lower extremity Hemodialysis access. The patient denies symptoms of numbness, tingling, weakness; c/o pain in medial aspect of thigh graft for 2 weeks after this area was infiltrated at HD center. She has also noted small pseudoaneurysm lateral loop of graft which she states has bled and gets bigger after HD. She was to see Dr. Edilia Bo yesterday but she is here in the [place].  Significant Diagnostic Studies: CBC Lab Results  Component Value Date   WBC 4.9 03/19/2013   HGB 10.1* 03/19/2013   HCT 31.5* 03/19/2013   MCV 105.4* 03/19/2013   PLT 156 03/19/2013    BMET    Component Value Date/Time   NA 138 03/19/2013 0753   K 3.9 03/19/2013 0753   CL 100 03/19/2013 0753   CO2 23 03/19/2013 0753   GLUCOSE 47* 03/19/2013 0753   BUN 37* 03/19/2013 0753   CREATININE 8.31* 03/19/2013 0753   CALCIUM 7.5* 03/19/2013 0753   CALCIUM 8.4 03/06/2011 0751   GFRNONAA 5* 03/19/2013 0753   GFRAA 6* 03/19/2013 0753    COAG Lab Results  Component Value Date   INR 1.06 01/27/2010   INR 1.0 12/26/2006   No results found for this basename: PTT    Vital Signs  BP Readings from Last 3 Encounters:  03/19/13 147/85  03/02/13 156/71  03/02/13 156/71   Temp Readings from Last 3 Encounters:  03/19/13 98.1 F (36.7 C) Oral  03/02/13 97.8 F (36.6 C)   03/02/13 97.8 F (36.6 C)    SpO2 Readings from Last 3 Encounters:  03/19/13 99%  03/02/13 96%  03/02/13 96%   Pulse Readings from Last 3 Encounters:  03/19/13 88  03/02/13 66  03/02/13 66     Physical Examination  right lower Incision is well healed, skin color is normal ,There is a good thrill and good bruit in the right thigh graft. There is a small pseudoaneurysm lateral loop of graft about the  size of a pea. There is no ulcer or scab over this.  Assessment/Plan [name] is a 42 y.o. year old female who presents s/p creation/revision of right lower extremity Hemodialysis access. She has a small pseudoaneurysm lateral graft without scab or ulcer. We will reassess after pt off of HD as she states it is larger off dialysis. Will notify Dr. Jonette Mate J 03/19/2013 10:48 AM  The patient has a small pseudoaneurysm along the lateral aspect of her right thigh AVG. The graft has a good thrill and has been functioning well. I would recommend repair of the small aneurysm electively as an outpatients once her colitis is resolved. To operate on it now would be associated with some increased risk of infection. Will follow.   Waverly Ferrari, MD, FACS [facility] [phone] 03/19/2013

## 2013-04-02 NOTE — Progress Notes (Signed)
Spoke with [facility] pa notified him of pts surgery location and k=4.3 pt instructed to return to dialysis tomorrow at [place] [facility]

## 2013-04-02 NOTE — Progress Notes (Signed)
Dialysis access record faxed to [facility] dialysis center

## 2013-04-02 NOTE — Anesthesia Postprocedure Evaluation (Signed)
Anesthesia Post Note  Patient: [name]  Procedure(s) Performed: Procedure(s) (LRB): REPAIR OF PSEUDOANEURYSM OF ARTERIOVENOUS GORETEX GRAFT (Right)  Anesthesia type: general  Patient location: PACU  Post pain: Pain level controlled  Post assessment: Patient's Cardiovascular Status Stable  Last Vitals:  Filed Vitals:   04/02/13 1500  BP: 138/90  Pulse: 75  Temp: 36.6 C  Resp:     Post vital signs: Reviewed and stable  Level of consciousness: sedated  Complications: No apparent anesthesia complications

## 2013-04-03 ENCOUNTER — Encounter (HOSPITAL_COMMUNITY): Payer: Self-pay | Admitting: Vascular Surgery

## 2013-04-06 ENCOUNTER — Encounter (HOSPITAL_COMMUNITY): Payer: Self-pay | Admitting: *Deleted

## 2013-04-06 ENCOUNTER — Inpatient Hospital Stay (HOSPITAL_COMMUNITY): Payer: Medicare Other

## 2013-04-06 ENCOUNTER — Emergency Department (HOSPITAL_COMMUNITY): Payer: Medicare Other

## 2013-04-06 ENCOUNTER — Inpatient Hospital Stay (HOSPITAL_COMMUNITY)
Admission: EM | Admit: 2013-04-06 | Discharge: 2013-04-11 | DRG: 637 | Disposition: A | Discharge status: Home / Self Care | Payer: Medicare Other | Attending: Family Medicine | Admitting: Family Medicine

## 2013-04-06 DIAGNOSIS — R109 Unspecified abdominal pain: Secondary | ICD-10-CM

## 2013-04-06 DIAGNOSIS — E162 Hypoglycemia, unspecified: Secondary | ICD-10-CM

## 2013-04-06 DIAGNOSIS — M79609 Pain in unspecified limb: Secondary | ICD-10-CM

## 2013-04-06 DIAGNOSIS — E1110 Type 2 diabetes mellitus with ketoacidosis without coma: Secondary | ICD-10-CM

## 2013-04-06 DIAGNOSIS — T827XXA Infection and inflammatory reaction due to other cardiac and vascular devices, implants and grafts, initial encounter: Secondary | ICD-10-CM | POA: Insufficient documentation

## 2013-04-06 DIAGNOSIS — Z940 Kidney transplant status: Secondary | ICD-10-CM

## 2013-04-06 DIAGNOSIS — D638 Anemia in other chronic diseases classified elsewhere: Secondary | ICD-10-CM | POA: Diagnosis present

## 2013-04-06 DIAGNOSIS — G8929 Other chronic pain: Secondary | ICD-10-CM | POA: Diagnosis present

## 2013-04-06 DIAGNOSIS — Z0000 Encounter for general adult medical examination without abnormal findings: Secondary | ICD-10-CM

## 2013-04-06 DIAGNOSIS — K529 Noninfective gastroenteritis and colitis, unspecified: Secondary | ICD-10-CM

## 2013-04-06 DIAGNOSIS — R209 Unspecified disturbances of skin sensation: Secondary | ICD-10-CM

## 2013-04-06 DIAGNOSIS — J449 Chronic obstructive pulmonary disease, unspecified: Secondary | ICD-10-CM | POA: Diagnosis present

## 2013-04-06 DIAGNOSIS — E870 Hyperosmolality and hypernatremia: Secondary | ICD-10-CM

## 2013-04-06 DIAGNOSIS — T380X5A Adverse effect of glucocorticoids and synthetic analogues, initial encounter: Secondary | ICD-10-CM | POA: Diagnosis present

## 2013-04-06 DIAGNOSIS — J209 Acute bronchitis, unspecified: Secondary | ICD-10-CM | POA: Diagnosis present

## 2013-04-06 DIAGNOSIS — Z992 Dependence on renal dialysis: Secondary | ICD-10-CM

## 2013-04-06 DIAGNOSIS — K625 Hemorrhage of anus and rectum: Secondary | ICD-10-CM

## 2013-04-06 DIAGNOSIS — Z9483 Pancreas transplant status: Secondary | ICD-10-CM

## 2013-04-06 DIAGNOSIS — E11319 Type 2 diabetes mellitus with unspecified diabetic retinopathy without macular edema: Secondary | ICD-10-CM

## 2013-04-06 DIAGNOSIS — E669 Obesity, unspecified: Secondary | ICD-10-CM | POA: Diagnosis present

## 2013-04-06 DIAGNOSIS — N186 End stage renal disease: Secondary | ICD-10-CM

## 2013-04-06 DIAGNOSIS — E785 Hyperlipidemia, unspecified: Secondary | ICD-10-CM | POA: Diagnosis present

## 2013-04-06 DIAGNOSIS — F329 Major depressive disorder, single episode, unspecified: Secondary | ICD-10-CM

## 2013-04-06 DIAGNOSIS — E1010 Type 1 diabetes mellitus with ketoacidosis without coma: Principal | ICD-10-CM | POA: Diagnosis present

## 2013-04-06 DIAGNOSIS — N2581 Secondary hyperparathyroidism of renal origin: Secondary | ICD-10-CM | POA: Diagnosis present

## 2013-04-06 DIAGNOSIS — K219 Gastro-esophageal reflux disease without esophagitis: Secondary | ICD-10-CM | POA: Diagnosis present

## 2013-04-06 DIAGNOSIS — M79651 Pain in right thigh: Secondary | ICD-10-CM

## 2013-04-06 DIAGNOSIS — G2581 Restless legs syndrome: Secondary | ICD-10-CM | POA: Diagnosis present

## 2013-04-06 DIAGNOSIS — I10 Essential (primary) hypertension: Secondary | ICD-10-CM

## 2013-04-06 DIAGNOSIS — E1065 Type 1 diabetes mellitus with hyperglycemia: Secondary | ICD-10-CM | POA: Diagnosis present

## 2013-04-06 DIAGNOSIS — I120 Hypertensive chronic kidney disease with stage 5 chronic kidney disease or end stage renal disease: Secondary | ICD-10-CM | POA: Diagnosis present

## 2013-04-06 DIAGNOSIS — R05 Cough: Secondary | ICD-10-CM

## 2013-04-06 DIAGNOSIS — Z23 Encounter for immunization: Secondary | ICD-10-CM

## 2013-04-06 DIAGNOSIS — F3289 Other specified depressive episodes: Secondary | ICD-10-CM | POA: Diagnosis present

## 2013-04-06 DIAGNOSIS — E1029 Type 1 diabetes mellitus with other diabetic kidney complication: Secondary | ICD-10-CM

## 2013-04-06 DIAGNOSIS — J4489 Other specified chronic obstructive pulmonary disease: Secondary | ICD-10-CM | POA: Diagnosis present

## 2013-04-06 DIAGNOSIS — D631 Anemia in chronic kidney disease: Secondary | ICD-10-CM

## 2013-04-06 DIAGNOSIS — Z880 Allergy status to penicillin: Secondary | ICD-10-CM

## 2013-04-06 DIAGNOSIS — J440 Chronic obstructive pulmonary disease with acute lower respiratory infection: Secondary | ICD-10-CM | POA: Diagnosis present

## 2013-04-06 DIAGNOSIS — E1039 Type 1 diabetes mellitus with other diabetic ophthalmic complication: Secondary | ICD-10-CM | POA: Diagnosis present

## 2013-04-06 DIAGNOSIS — Z6828 Body mass index (BMI) 28.0-28.9, adult: Secondary | ICD-10-CM

## 2013-04-06 DIAGNOSIS — IMO0002 Reserved for concepts with insufficient information to code with codable children: Secondary | ICD-10-CM

## 2013-04-06 DIAGNOSIS — E43 Unspecified severe protein-calorie malnutrition: Secondary | ICD-10-CM | POA: Insufficient documentation

## 2013-04-06 DIAGNOSIS — N058 Unspecified nephritic syndrome with other morphologic changes: Secondary | ICD-10-CM

## 2013-04-06 LAB — BASIC METABOLIC PANEL
BUN: 51 mg/dL — ABNORMAL HIGH (ref 6–23)
Calcium: 7.4 mg/dL — ABNORMAL LOW (ref 8.4–10.5)
Chloride: 91 mEq/L — ABNORMAL LOW (ref 96–112)
Creatinine, Ser: 9.6 mg/dL — ABNORMAL HIGH (ref 0.50–1.10)
GFR calc Af Amer: 5 mL/min — ABNORMAL LOW (ref >90)
GFR calc non Af Amer: 5 mL/min — ABNORMAL LOW (ref >90)
Glucose, Bld: 681 mg/dL (ref 70–99)

## 2013-04-06 LAB — CBC
HCT: 30.4 % — ABNORMAL LOW (ref 36.0–46.0)
Platelets: 152 10*3/uL (ref 150–400)
RBC: 3 MIL/uL — ABNORMAL LOW (ref 3.87–5.11)
RDW: 15 % (ref 11.5–15.5)
WBC: 6.6 10*3/uL (ref 4.0–10.5)

## 2013-04-06 LAB — CBC WITH DIFFERENTIAL/PLATELET
Basophils Relative: 0 % (ref 0–1)
Eosinophils Absolute: 0.1 10*3/uL (ref 0.0–0.7)
HCT: 33.6 % — ABNORMAL LOW (ref 36.0–46.0)
Hemoglobin: 10.8 g/dL — ABNORMAL LOW (ref 12.0–15.0)
MCH: 33.4 pg (ref 26.0–34.0)
MCHC: 32.1 g/dL (ref 30.0–36.0)
Monocytes Absolute: 0.2 10*3/uL (ref 0.1–1.0)
Monocytes Relative: 2 % — ABNORMAL LOW (ref 3–12)
WBC: 7.2 10*3/uL (ref 4.0–10.5)

## 2013-04-06 LAB — GLUCOSE, CAPILLARY
Glucose-Capillary: >600 mg/dL (ref 70–99)
Glucose-Capillary: 295 mg/dL — ABNORMAL HIGH (ref 70–99)
Glucose-Capillary: 254 mg/dL — ABNORMAL HIGH (ref 70–99)
Glucose-Capillary: 157 mg/dL — ABNORMAL HIGH (ref 70–99)
Glucose-Capillary: 117 mg/dL — ABNORMAL HIGH (ref 70–99)
Glucose-Capillary: 374 mg/dL — ABNORMAL HIGH (ref 70–99)

## 2013-04-06 LAB — MRSA PCR SCREENING: MRSA by PCR: NEGATIVE

## 2013-04-06 MED ORDER — HEPARIN SODIUM (PORCINE) 5000 UNIT/ML IJ SOLN
5000.0000 [IU] | Freq: Three times a day (TID) | INTRAMUSCULAR | Status: DC
  Filled 2013-04-06 (×19): qty 1

## 2013-04-06 MED ORDER — ALBUTEROL SULFATE HFA 108 (90 BASE) MCG/ACT IN AERS: 2.0000 | INHALATION_SPRAY | RESPIRATORY_TRACT | Status: DC | PRN

## 2013-04-06 MED ORDER — LEVOFLOXACIN 500 MG PO TABS
500.0000 mg | ORAL_TABLET | Freq: Once | ORAL | Status: AC
  Administered 2013-04-06: 500 mg via ORAL
  Filled 2013-04-06: qty 1

## 2013-04-06 MED ORDER — MYCOPHENOLATE MOFETIL 250 MG PO CAPS
250.0000 mg | ORAL_CAPSULE | Freq: Two times a day (BID) | ORAL | Status: DC
  Administered 2013-04-06 (×2): 250 mg via ORAL
  Filled 2013-04-06 (×4): qty 1

## 2013-04-06 MED ORDER — SODIUM CHLORIDE 0.9 % IV SOLN
INTRAVENOUS | Status: DC
  Administered 2013-04-06: 06:00:00 via INTRAVENOUS

## 2013-04-06 MED ORDER — SENNA 8.6 MG PO TABS
1.0000 | ORAL_TABLET | Freq: Every day | ORAL | Status: DC
  Administered 2013-04-09: 8.6 mg via ORAL
  Filled 2013-04-06 (×6): qty 1

## 2013-04-06 MED ORDER — HYDROMORPHONE HCL 2 MG PO TABS
2.0000 mg | ORAL_TABLET | ORAL | Status: DC | PRN
  Administered 2013-04-06 – 2013-04-08 (×8): 2 mg via ORAL
  Filled 2013-04-06 (×7): qty 1

## 2013-04-06 MED ORDER — INSULIN ASPART 100 UNIT/ML ~~LOC~~ SOLN
0.0000 [IU] | Freq: Three times a day (TID) | SUBCUTANEOUS | Status: DC
  Administered 2013-04-06: 3 [IU] via SUBCUTANEOUS
  Administered 2013-04-07: 8 [IU] via SUBCUTANEOUS
  Administered 2013-04-07 – 2013-04-08 (×2): 3 [IU] via SUBCUTANEOUS

## 2013-04-06 MED ORDER — NEPRO/CARBSTEADY PO LIQD
237.0000 mL | Freq: Three times a day (TID) | ORAL | Status: DC
  Administered 2013-04-06 – 2013-04-11 (×7): 237 mL via ORAL

## 2013-04-06 MED ORDER — SODIUM CHLORIDE 0.9 % IV SOLN
Freq: Once | INTRAVENOUS | Status: AC
  Administered 2013-04-06: 03:00:00 via INTRAVENOUS

## 2013-04-06 MED ORDER — SODIUM CHLORIDE 0.9 % IV SOLN
INTRAVENOUS | Status: DC
  Administered 2013-04-06: 3.3 [IU]/h via INTRAVENOUS
  Filled 2013-04-06: qty 1

## 2013-04-06 MED ORDER — METOPROLOL SUCCINATE ER 100 MG PO TB24
100.0000 mg | ORAL_TABLET | Freq: Every day | ORAL | Status: DC
  Administered 2013-04-06 – 2013-04-10 (×5): 100 mg via ORAL
  Filled 2013-04-06 (×6): qty 1

## 2013-04-06 MED ORDER — VANCOMYCIN HCL IN DEXTROSE 750-5 MG/150ML-% IV SOLN
750.0000 mg | INTRAVENOUS | Status: DC
  Administered 2013-04-07 – 2013-04-11 (×3): 750 mg via INTRAVENOUS
  Filled 2013-04-06 (×6): qty 150

## 2013-04-06 MED ORDER — DEXTROSE-NACL 5-0.45 % IV SOLN: INTRAVENOUS | Status: DC

## 2013-04-06 MED ORDER — VANCOMYCIN HCL 10 G IV SOLR
1500.0000 mg | Freq: Once | INTRAVENOUS | Status: AC
  Administered 2013-04-06: 1500 mg via INTRAVENOUS
  Filled 2013-04-06: qty 1500

## 2013-04-06 MED ORDER — SODIUM CHLORIDE 0.9 % IV SOLN: INTRAVENOUS | Status: DC

## 2013-04-06 MED ORDER — SODIUM CHLORIDE 0.9 % IV SOLN
INTRAVENOUS | Status: DC
  Administered 2013-04-06: 3.2 [IU]/h via INTRAVENOUS
  Filled 2013-04-06: qty 1

## 2013-04-06 MED ORDER — LEVOFLOXACIN IN D5W 750 MG/150ML IV SOLN
750.0000 mg | Freq: Once | INTRAVENOUS | Status: DC
  Filled 2013-04-06: qty 150

## 2013-04-06 MED ORDER — INSULIN ASPART 100 UNIT/ML ~~LOC~~ SOLN
5.0000 [IU] | Freq: Once | SUBCUTANEOUS | Status: AC
  Administered 2013-04-06: 5 [IU] via SUBCUTANEOUS

## 2013-04-06 MED ORDER — HYDROCODONE-ACETAMINOPHEN 5-325 MG PO TABS: 1.0000 | ORAL_TABLET | Freq: Four times a day (QID) | ORAL | Status: DC | PRN

## 2013-04-06 MED ORDER — ACETAMINOPHEN 500 MG PO TABS
1000.0000 mg | ORAL_TABLET | Freq: Once | ORAL | Status: AC
  Administered 2013-04-06: 1000 mg via ORAL
  Filled 2013-04-06: qty 2

## 2013-04-06 MED ORDER — LEVOFLOXACIN 500 MG PO TABS
500.0000 mg | ORAL_TABLET | ORAL | Status: DC
  Administered 2013-04-08 – 2013-04-10 (×2): 500 mg via ORAL
  Filled 2013-04-06 (×2): qty 1

## 2013-04-06 MED ORDER — INSULIN GLARGINE 100 UNIT/ML ~~LOC~~ SOLN
10.0000 [IU] | Freq: Every day | SUBCUTANEOUS | Status: DC
  Administered 2013-04-06: 10 [IU] via SUBCUTANEOUS
  Filled 2013-04-06 (×2): qty 0.1

## 2013-04-06 MED ORDER — INSULIN ASPART 100 UNIT/ML ~~LOC~~ SOLN
0.0000 [IU] | Freq: Every day | SUBCUTANEOUS | Status: DC
  Administered 2013-04-06: 5 [IU] via SUBCUTANEOUS

## 2013-04-06 MED ORDER — DEXTROSE 50 % IV SOLN: 25.0000 mL | INTRAVENOUS | Status: DC | PRN

## 2013-04-06 MED ORDER — RENA-VITE PO TABS
1.0000 | ORAL_TABLET | Freq: Every day | ORAL | Status: DC
  Administered 2013-04-06 – 2013-04-10 (×4): 1 via ORAL
  Filled 2013-04-06 (×7): qty 1

## 2013-04-06 MED ORDER — HYDROMORPHONE HCL PF 1 MG/ML IJ SOLN
0.5000 mg | INTRAMUSCULAR | Status: DC | PRN
  Administered 2013-04-06 (×2): 0.5 mg via INTRAVENOUS
  Filled 2013-04-06 (×2): qty 1

## 2013-04-06 MED ORDER — LEVOFLOXACIN IN D5W 500 MG/100ML IV SOLN: 500.0000 mg | INTRAVENOUS | Status: DC

## 2013-04-06 MED ORDER — TACROLIMUS 1 MG PO CAPS
1.0000 mg | ORAL_CAPSULE | Freq: Two times a day (BID) | ORAL | Status: DC
  Administered 2013-04-06 – 2013-04-11 (×11): 1 mg via ORAL
  Filled 2013-04-06 (×12): qty 1

## 2013-04-06 MED ORDER — CLONAZEPAM 1 MG PO TABS
1.0000 mg | ORAL_TABLET | Freq: Every day | ORAL | Status: DC
  Administered 2013-04-06 – 2013-04-10 (×5): 1 mg via ORAL
  Filled 2013-04-06 (×5): qty 1

## 2013-04-06 MED ORDER — AMLODIPINE BESYLATE 5 MG PO TABS
5.0000 mg | ORAL_TABLET | Freq: Every day | ORAL | Status: DC
  Administered 2013-04-06 – 2013-04-11 (×5): 5 mg via ORAL
  Filled 2013-04-06 (×6): qty 1

## 2013-04-06 MED ORDER — INSULIN GLARGINE 100 UNIT/ML ~~LOC~~ SOLN
10.0000 [IU] | Freq: Once | SUBCUTANEOUS | Status: AC
  Administered 2013-04-06: 10 [IU] via SUBCUTANEOUS
  Filled 2013-04-06 (×2): qty 0.1

## 2013-04-06 MED ORDER — ALBUTEROL SULFATE (5 MG/ML) 0.5% IN NEBU: 2.5000 mg | INHALATION_SOLUTION | RESPIRATORY_TRACT | Status: AC | PRN

## 2013-04-06 MED ORDER — INSULIN REGULAR BOLUS VIA INFUSION
0.0000 [IU] | Freq: Three times a day (TID) | INTRAVENOUS | Status: DC
  Administered 2013-04-06: 08:00:00 via INTRAVENOUS
  Filled 2013-04-06: qty 10

## 2013-04-06 MED ORDER — PREDNISONE 5 MG PO TABS
5.0000 mg | ORAL_TABLET | Freq: Every day | ORAL | Status: DC
  Administered 2013-04-06 – 2013-04-10 (×5): 5 mg via ORAL
  Filled 2013-04-06 (×7): qty 1

## 2013-04-06 MED ORDER — CALCIUM ACETATE 667 MG PO CAPS
2001.0000 mg | ORAL_CAPSULE | Freq: Three times a day (TID) | ORAL | Status: DC
  Administered 2013-04-06 – 2013-04-07 (×5): 2001 mg via ORAL
  Administered 2013-04-08: 1334 mg via ORAL
  Administered 2013-04-09 – 2013-04-11 (×6): 2001 mg via ORAL
  Filled 2013-04-06 (×19): qty 4

## 2013-04-06 MED ORDER — ALBUTEROL SULFATE (5 MG/ML) 0.5% IN NEBU
5.0000 mg | INHALATION_SOLUTION | Freq: Once | RESPIRATORY_TRACT | Status: AC
  Administered 2013-04-06: 5 mg via RESPIRATORY_TRACT
  Filled 2013-04-06: qty 1

## 2013-04-06 MED ORDER — DEXTROSE 50 % IV SOLN
25.0000 mL | INTRAVENOUS | Status: DC | PRN
  Administered 2013-04-09 – 2013-04-10 (×2): 25 mL via INTRAVENOUS
  Filled 2013-04-06 (×2): qty 50

## 2013-04-06 MED ORDER — DOCUSATE SODIUM 100 MG PO CAPS
100.0000 mg | ORAL_CAPSULE | Freq: Two times a day (BID) | ORAL | Status: DC
  Administered 2013-04-08 – 2013-04-09 (×2): 100 mg via ORAL
  Filled 2013-04-06 (×12): qty 1

## 2013-04-06 NOTE — ED Notes (Signed)
Rate changed to 10.8, CBG reading >600. Verified by [facility] RN.

## 2013-04-06 NOTE — Progress Notes (Signed)
[facility] PROGRESS NOTE  [name]:[id number] DOB: 05-28-1971 DOA: 04/06/2013 PCP: [name], MD  Assessment/Plan:  42 y.o. female with Past medical history of ESRD s/p renal transplant, but HD, recently underwent pseudoaneurysm repair of the fistula site, DM, HTN, HPL, anemia, depression, COPD, chronic intractable abdominal pain, immunosuppression with prednisone, GERD presented with abdominal pain found to be in DKA   1. DKA likely due to steroids; HA 1c-10 (02/2013) -glucose improving; 600-->393-->295; d/c insulin drip; SQ lantus 10+novolog 5; + ISS; cont CBG; then resume home regimen, increase lantus to 10 QHS+ISS  -d/w nephrologist may need HD to improve lytes/volume; CRX mild edema   -? Need to stop steroids (patient reports taking steroids for renal transplant, but on HD for the last 6 years?), await nephrology recommendation   2. ESRD  s/p renal transplant, but HD; CXR: pulmonary edema;  -as above;   3. Anemia AoCD; no s/s of acute bleeding; cont monitoring   4. HTN not at goal; resume home meds/titrtate  Add amlodipine   5. Chronic pain, cont pain control;   6. COPD/Bronchitis; cont bronchodilators, oxygen, atx  Code Status: full Family Communication: none at the bedside (indicate person spoken with, relationship, and if by phone, the number) Disposition Plan: home when ready   Consultants:  Nephrology   Procedures:  HD  Antibiotics:  levoflox 11/10<<<< (indicate start date, and stop date if known)  HPI/Subjective: alert  Objective: Filed Vitals:   04/06/13 0515  BP: 158/78  Pulse: 80  Temp:   Resp: 21    Intake/Output Summary (Last 24 hours) at 04/06/13 0758 Last data filed at 04/06/13 0700  Gross per 24 hour  Intake  74.12 ml  Output      0 ml  Net  74.12 ml   Filed Weights   04/06/13 0238  Weight: 65.772 kg (145 lb)    Exam:   General:  Alert   Cardiovascular: s1,s2 rrr  Respiratory:  Few crackles  in LL  Abdomen: soft, nt , nd   Musculoskeletal: no LE edema    Data Reviewed: Basic Metabolic Panel:  Recent Labs Lab 04/02/13 1203 04/06/13 0230  NA 132* 128*  K 4.3 4.8  CL  --  91*  CO2  --  18*  GLUCOSE 378* 681*  BUN  --  51*  CREATININE  --  9.60*  CALCIUM  --  7.4*   Liver Function Tests: No results found for this basename: AST, ALT, ALKPHOS, BILITOT, PROT, ALBUMIN,  in the last 168 hours No results found for this basename: LIPASE, AMYLASE,  in the last 168 hours No results found for this basename: AMMONIA,  in the last 168 hours CBC:  Recent Labs Lab 04/02/13 1203 04/06/13 0230  WBC  --  7.2  NEUTROABS  --  6.2  HGB 11.9* 10.8*  HCT 35.0* 33.6*  MCV  --  104.0*  PLT  --  152   Cardiac Enzymes: No results found for this basename: CKTOTAL, CKMB, CKMBINDEX, TROPONINI,  in the last 168 hours BNP (last 3 results) No results found for this basename: PROBNP,  in the last 8760 hours CBG:  Recent Labs Lab 04/02/13 1346 04/02/13 1604 04/06/13 0203 04/06/13 0420 04/06/13 0526  GLUCAP 454* 424* 594* >600* 517*    No results found for this or any previous visit (from the past 240 hour(s)).   Studies: Dg Chest Port 1 View  04/06/2013   CLINICAL DATA:  Cough and shortness of breath.  EXAM: PORTABLE CHEST - 1 VIEW  COMPARISON:  Chest radiograph performed 03/27/2013  FINDINGS: The lungs are well-aerated. Vascular congestion is noted, with increased interstitial markings, concerning for mild pulmonary edema. There is no evidence of pleural effusion or pneumothorax.  The cardiomediastinal silhouette is mildly enlarged. No acute osseous abnormalities are seen.  IMPRESSION: Vascular congestion and mild cardiomegaly, with increased interstitial markings, concerning for mild pulmonary edema.   Electronically Signed   By: Roanna Raider M.D.   On: 04/06/2013 05:13    Scheduled Meds: . calcium acetate  2,001-2,668 mg Oral TID WC  . clonazePAM  1 mg Oral QHS  .  heparin  5,000 Units Subcutaneous Q8H  . insulin aspart  5 Units Subcutaneous Once  . insulin glargine  10 Units Subcutaneous Once  . insulin regular  0-10 Units Intravenous TID WC  . [START ON 04/08/2013] levofloxacin (LEVAQUIN) IV  500 mg Intravenous Q48H  . levofloxacin (LEVAQUIN) IV  750 mg Intravenous Once  . metoprolol succinate  100 mg Oral QHS  . mycophenolate  250 mg Oral BID  . predniSONE  5 mg Oral Q breakfast  . tacrolimus  1 mg Oral BID  . vancomycin  1,500 mg Intravenous Once  . [START ON 04/07/2013] vancomycin  750 mg Intravenous Q T,Th,Sa-HD   Continuous Infusions: . insulin (NOVOLIN-R) infusion 3.3 Units/hr (04/06/13 0630)    Principal Problem:   DKA (diabetic ketoacidoses) Active Problems:   ESRD (end stage renal disease)   DM (diabetes mellitus), type 1 with renal complications   Acute bronchitis    Time spent: >35 minutes     Esperanza Sheets  [facility] Pager [phone]. If 7PM-7AM, please contact night-coverage at [email or web address], password [facility] 04/06/2013, 7:58 AM  LOS: 0 days

## 2013-04-06 NOTE — ED Notes (Addendum)
Pt going to room 54M rm 4.

## 2013-04-06 NOTE — ED Provider Notes (Signed)
CSN: [id number]     Arrival date & time 04/06/13  0148 History   First MD Initiated Contact with Patient 04/06/13 0154     Chief Complaint  Patient presents with  . Headache  . Leg Pain  . Hyperglycemia   (Consider location/radiation/quality/duration/timing/severity/associated sxs/prior Treatment) HPI Comments: Pt with hx of ESRD on dialysis - told recently that she had a graft infection in the L thigh access - has had abx with dialysis and states that the redness has gone away but has had gradually worsening thigh pain.  She has also had mild headache - she denies fevers or vomiting but does note that the blood sugar is gradually rising - denies missing her medicines for DM - Sx are persistent, gradually worsening.    Patient is a 42 y.o. female presenting with headaches and leg pain. The history is provided by the patient and medical records.  Headache Leg Pain   Past Medical History  Diagnosis Date  . Hyperlipidemia   . Alopecia   . Obesity   . Iron deficiency   . 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 RLS (restless legs syndrome)   . Respiratory failure   . Injury of conjunctiva and corneal abrasion of right eye without foreign body   . Cellulitis   . Dialysis patient     Tues; Thurs; Sat; [place]  . 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 Anemia   . Blood transfusion 2004    "when I had my transplant"  . Migraine   . 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 Hyperparathyroidism, secondary   . Diabetic retinopathy   . Hypertension     takes Metoprolol and Exforge daily  . Depression     takes Klonopin nightly  . Diabetes mellitus type 1     Pt states diagnosed at age 29 with prior episodes of DKA. S/p pancreatic transplant   . GERD (gastroesophageal reflux disease)     takes Protonix daily  . PONV (postoperative nausea and vomiting)    . Bronchitis   . Migraine   . Asthma   . Shortness of breath   . Emphysema of lung   . DKA (diabetic ketoacidoses) 07/02/2012  . Angina     none in past 2 years.  . Renal insufficiency   . Stomach pain     Hx: of chronic  . Pneumonia 2012    "double"   Past Surgical History  Procedure Laterality Date  . 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 Thyroglobulin      x 7  . Av fistula placement      right upper arm  . Cholecystectomy  1995  .  hd graft placement/removal      "had 2 in my left upper arm"  . Eye surgery    . Retinopathy surgery    . Tooth extraction  10/10/11    X's two  . Insertion of dialysis catheter  12/08/2011    Procedure: INSERTION OF DIALYSIS CATHETER;  Surgeon: Chuck Hint, MD;  Location[facility] OR;  Service: Vascular;  Laterality: Left;  . Av fistula placement  01/22/2012    Procedure: INSERTION OF ARTERIOVENOUS (AV) GORE-TEX GRAFT ARM;  Surgeon: Chuck Hint, MD;  Location: [facility] OR;  Service: Vascular;  Laterality: Left;  Ultrasound  guided.  . Av fistula placement  03/14/2012    Procedure: INSERTION OF ARTERIOVENOUS (AV) GORE-TEX GRAFT THIGH;  Surgeon: Chuck Hint, MD;  Location: [facility] OR;  Service: Vascular;  Laterality: Right;  . False aneurysm repair Right 01/02/2013    Procedure: Excision of lymphocele in right thigh.;  Surgeon: Chuck Hint, MD;  Location: [facility] OR;  Service: Vascular;  Laterality: Right;  . Carpal tunnel release Right 03/02/2013    Procedure: CARPAL TUNNEL RELEASE;  Surgeon: Tami Ribas, MD;  Location: [facility];  Service: Orthopedics;  Laterality: Right;  . Flexible sigmoidoscopy N/A 03/24/2013    Procedure: FLEXIBLE SIGMOIDOSCOPY;  Surgeon: Florencia Reasons, MD;  Location: [facility] ENDOSCOPY;  Service: Endoscopy;  Laterality: N/A;  . Revision of arteriovenous goretex graft Right 04/02/2013    Procedure: REPAIR OF PSEUDOANEURYSM OF ARTERIOVENOUS GORETEX GRAFT;   Surgeon: Chuck Hint, MD;  Location: [facility] OR;  Service: Vascular;  Laterality: Right;   Family History  Problem Relation Age of Onset  . Hypertension Mother   . Kidney disease Mother   . Diabetes Mother   . Hypertension Father   . Kidney disease Father   . Diabetes Father    History  Substance Use Topics  . Smoking status: Never Smoker   . Smokeless tobacco: Never Used  . Alcohol Use: No   OB History   Grav Para Term Preterm Abortions TAB SAB Ect Mult Living                 Review of Systems  Neurological: Positive for headaches.  All other systems reviewed and are negative.    Allergies  Depakote; Morphine and related; Penicillins; Tramadol; and Vicodin  Home Medications   Current Outpatient Rx  Name  Route  Sig  Dispense  Refill  . albuterol (PROVENTIL HFA;VENTOLIN HFA) 108 (90 BASE) MCG/ACT inhaler   Inhalation   Inhale 2 puffs into the lungs every 4 (four) hours as needed for wheezing or shortness of breath (as per home regimen).   1 Inhaler   0   . calcium acetate (PHOSLO) 667 MG capsule   Oral   Take 2,001-2,668 mg by mouth 3 (three) times daily with meals. Dosage depends on meal consumed         . clonazePAM (KLONOPIN) 0.5 MG tablet   Oral   Take 1 mg by mouth at bedtime.          Marland Kitchen EXFORGE 10-160 MG per tablet   Oral   Take 1 tablet by mouth at bedtime.          . gabapentin (NEURONTIN) 100 MG capsule   Oral   Take 100 mg by mouth daily.         Marland Kitchen HYDROcodone-acetaminophen (NORCO) 5-325 MG per tablet   Oral   Take 1 tablet by mouth every 6 (six) hours as needed.   20 tablet   0   . insulin glargine (LANTUS) 100 UNIT/ML injection   Subcutaneous   Inject 0.07 mLs (7 Units total) into the skin 2 (two) times daily.   10 mL   2   . metoprolol succinate (TOPROL-XL) 100 MG 24 hr tablet   Oral   Take 100 mg by mouth at bedtime. Take with or immediately following a meal.         . multivitamin (RENA-VIT) TABS tablet   Oral    Take 1 tablet by mouth daily.         Marland Kitchen  mycophenolate (CELLCEPT) 250 MG capsule   Oral   Take 250 mg by mouth 2 (two) times daily.         . Nutritional Supplements (FEEDING SUPPLEMENT, NEPRO CARB STEADY,) LIQD   Oral   Take 237 mLs by mouth daily.      0   . pantoprazole (PROTONIX) 40 MG tablet   Oral   Take 1 tablet (40 mg total) by mouth at bedtime.   30 tablet   0   . predniSONE (DELTASONE) 5 MG tablet   Oral   Take 5 mg by mouth daily.          . tacrolimus (PROGRAF) 1 MG capsule   Oral   Take 1 mg by mouth 2 (two) times daily.          BP 145/77  Pulse 76  Temp(Src) 97.9 F (36.6 C) (Oral)  Resp 21  Ht 5\' 1"  (1.549 m)  Wt 145 lb (65.772 kg)  BMI 27.41 kg/m2  SpO2 99%  LMP 03/14/2013 Physical Exam  Nursing note and vitals reviewed. Constitutional: She appears well-developed and well-nourished. No distress.  HENT:  Head: Normocephalic and atraumatic.  Mouth/Throat: Oropharynx is clear and moist. No oropharyngeal exudate.  Sinuses non tender, no nasal d/c, clear turbinates and nares.  OP clear and MMM.  Eyes: Conjunctivae and EOM are normal. Pupils are equal, round, and reactive to light. Right eye exhibits no discharge. Left eye exhibits no discharge. No scleral icterus.  Neck: Normal range of motion. Neck supple. No JVD present. No thyromegaly present.  Cardiovascular: Normal rate, regular rhythm, normal heart sounds and intact distal pulses.  Exam reveals no gallop and no friction rub.   No murmur heard. Pulmonary/Chest: Effort normal and breath sounds normal. No respiratory distress. She has no wheezes. She has no rales.  Abdominal: Soft. Bowel sounds are normal. She exhibits no distension and no mass. There is no tenderness.  Musculoskeletal: Normal range of motion. She exhibits tenderness ( ttp overlying the graft site of the RLE.  this is in the proximal thigh - there is a palpable thrill in the graft.). She exhibits no edema.  Lymphadenopathy:     She has no cervical adenopathy.  Neurological: She is alert. Coordination normal.  Skin: Skin is warm and dry. No rash noted. No erythema.  Psychiatric: She has a normal mood and affect. Her behavior is normal.    ED Course  Procedures (including critical care time) Labs Review Labs Reviewed  GLUCOSE, CAPILLARY - Abnormal; Notable for the following:    Glucose-Capillary 594 (*)    All other components within normal limits  CBC WITH DIFFERENTIAL - Abnormal; Notable for the following:    RBC 3.23 (*)    Hemoglobin 10.8 (*)    HCT 33.6 (*)    MCV 104.0 (*)    Neutrophils Relative % 86 (*)    Lymphocytes Relative 11 (*)    Monocytes Relative 2 (*)    All other components within normal limits  BASIC METABOLIC PANEL - Abnormal; Notable for the following:    Sodium 128 (*)    Chloride 91 (*)    CO2 18 (*)    Glucose, Bld 681 (*)    BUN 51 (*)    Creatinine, Ser 9.60 (*)    Calcium 7.4 (*)    GFR calc non Af Amer 5 (*)    GFR calc Af Amer 5 (*)    All other components within normal limits  GLUCOSE,  CAPILLARY - Abnormal; Notable for the following:    Glucose-Capillary >600 (*)    All other components within normal limits   Imaging Review No results found.  ED ECG REPORT  I personally interpreted this EKG   Date: 04/06/2013   Rate: 76  Rhythm: normal sinus rhythm  QRS Axis: Right  Intervals: normal  ST/T Wave abnormalities: He wave abnormalities in the inferior and lateral precordial leads  Conduction Disutrbances:none  Narrative Interpretation:   Old EKG Reviewed: Compared with 03/16/2013, lateral precordial changes unchanged, T-wave abnormalities in leads 3 and F. with similar pattern but more exaggerated today, compared with 06/09/2012, similar changes.   MDM   1. DKA (diabetic ketoacidoses)   2. Right thigh pain    Pt has signs of infection in the graft with ttp but is improving by appearance with no redness or fevers.  She has elevated CBG and in the setting of  possible infection in graft is concerning - labs pending, CBG ~ 600.  Insulin, gentle fluids.    Pt has large anion gap with hyperglycemia close to 700 and CO2 of 18.  She will be given albuterol neb for her mild wheezing and mild cough.  Will get CXR due to cough.  Admit.  The patient does have a metabolic acidosis, she is requiring an insulin drip and will need a step down unit. Critical care provided for this patient with diabetic ketoacidosis on dialysis.  CRITICAL CARE Performed by: Vida Roller Total critical care time: 35 Critical care time was exclusive of separately billable procedures and treating other patients. Critical care was necessary to treat or prevent imminent or life-threatening deterioration. Critical care was time spent personally by me on the following activities: development of treatment plan with patient and/or surrogate as well as nursing, discussions with consultants, evaluation of patient's response to treatment, examination of patient, obtaining history from patient or surrogate, ordering and performing treatments and interventions, ordering and review of laboratory studies, ordering and review of radiographic studies, pulse oximetry and re-evaluation of patient's condition.   Vida Roller, MD 04/06/13 [phone]

## 2013-04-06 NOTE — Progress Notes (Signed)
Came to start a PIV at staff nurse request.   Right EJ IV is leaking.   Pt. Requesting a central line and not to be stuck for an IV.   Lab was in room and mentioned he had to stick her several times just to get labs.   Primary RN made aware.

## 2013-04-06 NOTE — ED Notes (Signed)
Pt. reports migraine headache onset last Thursday and right upper thigh graft pain , hemodialysis q Tues /Thurs/Sat .

## 2013-04-06 NOTE — ED Notes (Signed)
Rate changed to 9.1.

## 2013-04-06 NOTE — ED Notes (Signed)
Attempted to start an IV on pt, unsuccessful attempt. Charge RN asked to come start an IV.

## 2013-04-06 NOTE — ED Notes (Signed)
CRITICAL VALUE ALERT  Critical value received:  Glucose 681  Date of notification:  04/06/13  Time of notification:  0359  Critical value read back:yes  Nurse who received alert:  Eston Esters RN  MD notified:  Hyacinth Meeker MD

## 2013-04-06 NOTE — Consult Note (Signed)
I saw the patient and agree with the above assessment and plan.    Pt only complaint is of pain at the thigh AVG.  Could just be post surgical as this was less than 1 week ago but on ABX for potential infection.  AVG is tender to touch but no induration, erythema, or purulence.  Given immunosuppression would cont ABX and will obtain soft tissue US for fluid collections.  Pt on Tac/MMF/Pred after KP in 2004 but back on RRT since 2008 and insulin pt reports since early 2013.  Her UOP is minimal per her report.  I do not belive she is active on a transplant list.  I advised her that we stop the MMF now and Tac within the next few weeks with pred being reduced more gradually.  She asked that we d/w with [facility] and I have sent an email.    HD tomorrow.

## 2013-04-06 NOTE — Progress Notes (Signed)
ANTIBIOTIC CONSULT NOTE - INITIAL  Pharmacy Consult for levaquin and vancomyin  Indication: pneumonia  Allergies  Allergen Reactions  . Depakote [Divalproex Sodium] Other (See Comments)    Pt gets "delirious"  . Morphine And Related Nausea And Vomiting and Other (See Comments)    "makes me delirious"  . Penicillins Anaphylaxis  . Tramadol Nausea And Vomiting  . Vicodin [Hydrocodone-Acetaminophen] Itching and Rash    Patient Measurements: Height: 5\' 1"  (154.9 cm) Weight: 145 lb (65.772 kg) IBW/kg (Calculated) : 47.8 Adjusted Body Weight:   Vital Signs: Temp: 97.9 F (36.6 C) (11/10 0200) Temp src: Oral (11/10 0200) BP: 158/78 mmHg (11/10 0515) Pulse Rate: 80 (11/10 0515) Intake/Output from previous day: 11/09 0701 - 11/10 0700 In: 24.1 [I.V.:24.1] Out: -  Intake/Output from this shift:    Labs:  Recent Labs  04/06/13 0230  WBC 7.2  HGB 10.8*  PLT 152  CREATININE 9.60*   Estimated Creatinine Clearance: 6.8 ml/min (by C-G formula based on Cr of 9.6). No results found for this basename: VANCOTROUGH, Leodis Binet, VANCORANDOM, GENTTROUGH, GENTPEAK, GENTRANDOM, TOBRATROUGH, TOBRAPEAK, TOBRARND, AMIKACINPEAK, AMIKACINTROU, AMIKACIN,  in the last 72 hours   Microbiology: Recent Results (from the past 720 hour(s))  MRSA PCR SCREENING     Status: None   Collection Time    03/16/13  9:47 PM      Result Value Range Status   MRSA by PCR NEGATIVE  NEGATIVE Final   Comment:            The GeneXpert MRSA Assay (FDA     approved for NASAL specimens     only), is one component of a     comprehensive MRSA colonization     surveillance program. It is not     intended to diagnose MRSA     infection nor to guide or     monitor treatment for     MRSA infections.    Medical History: Past Medical History  Diagnosis Date  . Hyperlipidemia   . Alopecia   . Obesity   . Iron deficiency   . ESRD (end stage renal disease)     S/p pancreatic and kidney transplant, but back on  HD 2008  . RLS (restless legs syndrome)   . Respiratory failure   . Injury of conjunctiva and corneal abrasion of right eye without foreign body   . Cellulitis   . Dialysis patient     Tues; Thurs; Sat; [place]  . 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 Anemia   . Blood transfusion 2004    "when I had my transplant"  . Migraine   . 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 Hyperparathyroidism, secondary   . Diabetic retinopathy   . Hypertension     takes Metoprolol and Exforge daily  . Depression     takes Klonopin nightly  . Diabetes mellitus type 1     Pt states diagnosed at age 77 with prior episodes of DKA. S/p pancreatic transplant   . GERD (gastroesophageal reflux disease)     takes Protonix daily  . PONV (postoperative nausea and vomiting)   . Bronchitis   . Migraine   . Asthma   . Shortness of breath   . Emphysema of lung   . DKA (diabetic ketoacidoses) 07/02/2012  . Angina     none in past 2 years.  . Renal insufficiency   . Stomach pain  Hx: of chronic  . Pneumonia 2012    "double"    Medications:  Prescriptions prior to admission  Medication Sig Dispense Refill  . albuterol (PROVENTIL HFA;VENTOLIN HFA) 108 (90 BASE) MCG/ACT inhaler Inhale 2 puffs into the lungs every 4 (four) hours as needed for wheezing or shortness of breath (as per home regimen).  1 Inhaler  0  . calcium acetate (PHOSLO) 667 MG capsule Take 2,001-2,668 mg by mouth 3 (three) times daily with meals. Dosage depends on meal consumed      . clonazePAM (KLONOPIN) 0.5 MG tablet Take 1 mg by mouth at bedtime.       Marland Kitchen EXFORGE 10-160 MG per tablet Take 1 tablet by mouth at bedtime.       . gabapentin (NEURONTIN) 100 MG capsule Take 100 mg by mouth daily.      Marland Kitchen HYDROcodone-acetaminophen (NORCO) 5-325 MG per tablet Take 1 tablet by mouth every 6 (six) hours as needed.  20 tablet  0  . insulin glargine (LANTUS) 100 UNIT/ML  injection Inject 0.07 mLs (7 Units total) into the skin 2 (two) times daily.  10 mL  2  . metoprolol succinate (TOPROL-XL) 100 MG 24 hr tablet Take 100 mg by mouth at bedtime. Take with or immediately following a meal.      . multivitamin (RENA-VIT) TABS tablet Take 1 tablet by mouth daily.      . mycophenolate (CELLCEPT) 250 MG capsule Take 250 mg by mouth 2 (two) times daily.      . Nutritional Supplements (FEEDING SUPPLEMENT, NEPRO CARB STEADY,) LIQD Take 237 mLs by mouth daily.    0  . pantoprazole (PROTONIX) 40 MG tablet Take 1 tablet (40 mg total) by mouth at bedtime.  30 tablet  0  . predniSONE (DELTASONE) 5 MG tablet Take 5 mg by mouth daily.       . tacrolimus (PROGRAF) 1 MG capsule Take 1 mg by mouth 2 (two) times daily.       Assessment: 42 yo female with suspected PNA vs Bronchitis. Vanc with HD.   Goal of Therapy:  vanc 20-32mcg/ml pre HD levels. HD dose of levaquin  Plan:  leavaquin 750 mg x1 then 500 mg q48 hr. Vancomycin 1500 mg x1 then 750mg  after each 4 hour HD session  Janice Coffin 04/06/2013,7:21 AM

## 2013-04-06 NOTE — Progress Notes (Signed)
Inpatient Diabetes Program Recommendations  AACE/ADA: New Consensus Statement on Inpatient Glycemic Control (2013)  Target Ranges:  Prepandial:   less than 140 mg/dL      Peak postprandial:   less than 180 mg/dL (1-2 hours)      Critically ill patients:  140 - 180 mg/dL   Reason for Visit: DKA  Results for MAELIN, KURKOWSKI (MRN [id number]) as of 04/06/2013 15:53  Ref. Range 04/06/2013 02:03 04/06/2013 04:20 04/06/2013 05:26 04/06/2013 06:24 04/06/2013 07:27 04/06/2013 08:38 04/06/2013 12:17  Glucose-Capillary Latest Range: 70-99 mg/dL 409 (HH) >811 (HH) 914 (H) 393 (H) 295 (H) 254 (H) 157 (H)   Results for REAGAN, BEHLKE (MRN [id number]) as of 04/06/2013 15:53  Ref. Range 04/06/2013 02:30  Sodium Latest Range: 135-145 mEq/L 128 (L)  Potassium Latest Range: 3.5-5.1 mEq/L 4.8  Chloride Latest Range: 96-112 mEq/L 91 (L)  CO2 Latest Range: 19-32 mEq/L 18 (L)  BUN Latest Range: 6-23 mg/dL 51 (H)  Creatinine Latest Range: 0.50-1.10 mg/dL 0.86 (H)  Calcium Latest Range: 8.4-10.5 mg/dL 7.4 (L)  GFR calc non Af Amer Latest Range: >90 mL/min 5 (L)  GFR calc Af Amer Latest Range: >90 mL/min 5 (L)  Glucose Latest Range: 70-99 mg/dL [facility])  Results for BRANTLEY, NASER (MRN [id number]) as of 04/06/2013 15:53  Ref. Range 03/17/2013 04:00  Hemoglobin A1C Latest Range: <5.7 % 10.0 (H)   6th admission in 6 mo. Has transitioned from GlucoStabilizer to SQ insulin.  Will likely need addition of meal coverage insulin - Novolog 3 units tidwc if pt eats >50% meal.   Will follow daily. Thank you. Ailene Ards, RD, LDN, CDE Inpatient Diabetes Coordinator [phone]

## 2013-04-06 NOTE — ED Notes (Signed)
Pt placed on 2L 02, pt's 02 sats reading 88% on RA. Pt states she does not wear 02 at home however her MD is considering her to start wearing 02 continuously at home.

## 2013-04-06 NOTE — H&P (Signed)
[facility] History and Physical  Patient: [name]  WGN:[id number]  DOB: Aug 24, 1970  DOS: 04/06/2013 PCP: [name], MD  Chief Complaint: Right leg pain  HPI: [name] is a 42 y.o. female with Past medical history of ESRD on hemodialysis, recently underwent pseudoaneurysm repair of the fistula site, diabetes mellitus, hypertension, chronic intractable abdominal pain, immunosuppression with prednisone, GERD. The patient is coming from home. The patient presented with complaints of headache and right eye pain that has been ongoing since Thursday. She also complained of abdominal pain which has been ongoing since today. The patient mentions that she had infection in her right eye and has been getting antibiotics with dialysis vancomycin. As per the recent documentation she has undergone pseudoaneurysm repair on 11/6 and might have been given prophylactic antibiotic for that particular procedure, the procedure not mentions there was no infection of the graft site. She has intractable abdominal pain for which she was recommended to followup with GI during her last admission and today she mentions that the pain is recurring again located diffusely in the abdomen and sharp in nature not associated with food, she also complains of nausea and one episode of vomiting before arrival to the ED. She denied any fever but does mention that she has cough which is new for her and has shortness of breath.  Review of Systems: as mentioned in the history of present illness.  A Comprehensive review of the other systems is negative.  Past Medical History  Diagnosis Date  . Hyperlipidemia   . Alopecia   . Obesity   . Iron deficiency   . 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 RLS (restless legs syndrome)   . Respiratory failure   . Injury of conjunctiva and corneal abrasion of right eye without foreign body   .  Cellulitis   . Dialysis patient     Tues; Thurs; Sat; [place]  . 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 Anemia   . Blood transfusion 2004    "when I had my transplant"  . Migraine   . 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 Hyperparathyroidism, secondary   . Diabetic retinopathy   . Hypertension     takes Metoprolol and Exforge daily  . Depression     takes Klonopin nightly  . Diabetes mellitus type 1     Pt states diagnosed at age 44 with prior episodes of DKA. S/p pancreatic transplant   . GERD (gastroesophageal reflux disease)     takes Protonix daily  . PONV (postoperative nausea and vomiting)   . Bronchitis   . Migraine   . Asthma   . Shortness of breath   . Emphysema of lung   . DKA (diabetic ketoacidoses) 07/02/2012  . Angina     none in past 2 years.  . Renal insufficiency   . Stomach pain     Hx: of chronic  . Pneumonia 2012    "double"   Past Surgical History  Procedure Laterality Date  . 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 Thyroglobulin      x 7  . Av fistula placement      right upper arm  . Cholecystectomy  1995  .  hd graft placement/removal      "had 2 in my  left upper arm"  . Eye surgery    . Retinopathy surgery    . Tooth extraction  10/10/11    X's two  . Insertion of dialysis catheter  12/08/2011    Procedure: INSERTION OF DIALYSIS CATHETER;  Surgeon: Chuck Hint, MD;  Location: [facility] OR;  Service: Vascular;  Laterality: Left;  . Av fistula placement  01/22/2012    Procedure: INSERTION OF ARTERIOVENOUS (AV) GORE-TEX GRAFT ARM;  Surgeon: Chuck Hint, MD;  Location: [facility] OR;  Service: Vascular;  Laterality: Left;  Ultrasound guided.  . Av fistula placement  03/14/2012    Procedure: INSERTION OF ARTERIOVENOUS (AV) GORE-TEX GRAFT THIGH;  Surgeon: Chuck Hint, MD;  Location: [facility] OR;  Service: Vascular;  Laterality:  Right;  . False aneurysm repair Right 01/02/2013    Procedure: Excision of lymphocele in right thigh.;  Surgeon: Chuck Hint, MD;  Location: [facility] OR;  Service: Vascular;  Laterality: Right;  . Carpal tunnel release Right 03/02/2013    Procedure: CARPAL TUNNEL RELEASE;  Surgeon: Tami Ribas, MD;  Location: [facility];  Service: Orthopedics;  Laterality: Right;  . Flexible sigmoidoscopy N/A 03/24/2013    Procedure: FLEXIBLE SIGMOIDOSCOPY;  Surgeon: Florencia Reasons, MD;  Location: [facility] ENDOSCOPY;  Service: Endoscopy;  Laterality: N/A;  . Revision of arteriovenous goretex graft Right 04/02/2013    Procedure: REPAIR OF PSEUDOANEURYSM OF ARTERIOVENOUS GORETEX GRAFT;  Surgeon: Chuck Hint, MD;  Location: [facility] OR;  Service: Vascular;  Laterality: Right;   Social History:  reports that she has never smoked. She has never used smokeless tobacco. She reports that she does not drink alcohol or use illicit drugs. Independent for most of her  ADL.  Allergies  Allergen Reactions  . Depakote [Divalproex Sodium] Other (See Comments)    Pt gets "delirious"  . Morphine And Related Nausea And Vomiting and Other (See Comments)    "makes me delirious"  . Penicillins Anaphylaxis  . Tramadol Nausea And Vomiting  . Vicodin [Hydrocodone-Acetaminophen] Itching and Rash    Family History  Problem Relation Age of Onset  . Hypertension Mother   . Kidney disease Mother   . Diabetes Mother   . Hypertension Father   . Kidney disease Father   . Diabetes Father     Prior to Admission medications   Medication Sig Start Date End Date Taking? Authorizing Provider  albuterol (PROVENTIL HFA;VENTOLIN HFA) 108 (90 BASE) MCG/ACT inhaler Inhale 2 puffs into the lungs every 4 (four) hours as needed for wheezing or shortness of breath (as per home regimen). 02/20/13  Yes Esperanza Sheets, MD  calcium acetate (PHOSLO) 667 MG capsule Take 2,001-2,668 mg by mouth 3 (three) times daily with meals.  Dosage depends on meal consumed 03/12/11  Yes Historical Provider, MD  clonazePAM (KLONOPIN) 0.5 MG tablet Take 1 mg by mouth at bedtime.    Yes Historical Provider, MD  EXFORGE 10-160 MG per tablet Take 1 tablet by mouth at bedtime.  06/15/12  Yes Historical Provider, MD  gabapentin (NEURONTIN) 100 MG capsule Take 100 mg by mouth daily.   Yes Historical Provider, MD  HYDROcodone-acetaminophen (NORCO) 5-325 MG per tablet Take 1 tablet by mouth every 6 (six) hours as needed. 04/02/13  Yes Samantha J Rhyne, PA-C  insulin glargine (LANTUS) 100 UNIT/ML injection Inject 0.07 mLs (7 Units total) into the skin 2 (two) times daily. 03/27/13  Yes Kathlen Mody, MD  metoprolol succinate (TOPROL-XL) 100 MG 24 hr tablet Take 100 mg  by mouth at bedtime. Take with or immediately following a meal.   Yes Historical Provider, MD  multivitamin (RENA-VIT) TABS tablet Take 1 tablet by mouth daily.   Yes Historical Provider, MD  mycophenolate (CELLCEPT) 250 MG capsule Take 250 mg by mouth 2 (two) times daily.   Yes Historical Provider, MD  Nutritional Supplements (FEEDING SUPPLEMENT, NEPRO CARB STEADY,) LIQD Take 237 mLs by mouth daily. 03/27/13  Yes Kathlen Mody, MD  pantoprazole (PROTONIX) 40 MG tablet Take 1 tablet (40 mg total) by mouth at bedtime. 03/27/13  Yes Kathlen Mody, MD  predniSONE (DELTASONE) 5 MG tablet Take 5 mg by mouth daily.  01/21/11  Yes Historical Provider, MD  tacrolimus (PROGRAF) 1 MG capsule Take 1 mg by mouth 2 (two) times daily.   Yes Historical Provider, MD    Physical Exam: Filed Vitals:   04/06/13 0415 04/06/13 0419 04/06/13 0445 04/06/13 0515  BP: 148/77  164/87 158/78  Pulse: 78  80 80  Temp:      TempSrc:      Resp: 25  31 21   Height:      Weight:      SpO2: 97% 99% 96% 100%    General: Alert, Awake and Oriented to Time, Place and Person. Appear in mild distress Eyes: PERRL ENT: Oral Mucosa clear moist. Neck: No JVD Cardiovascular: S1 and S2 Present, no Murmur, Peripheral  Pulses Present Respiratory: Bilateral Air entry equal and Decreased, right-sided basal Crackles, no wheezes Abdomen: Bowel Sound Present, Soft and diffusely minimally tender no guarding no rigidity Skin: No Rash Extremities: Trace bilaterally Pedal edema, presence of AV graft on right upper thigh, no calf tenderness Neurologic: Grossly Unremarkable.  Labs on Admission:  CBC:  Recent Labs Lab 04/02/13 1203 04/06/13 0230  WBC  --  7.2  NEUTROABS  --  6.2  HGB 11.9* 10.8*  HCT 35.0* 33.6*  MCV  --  104.0*  PLT  --  152    CMP     Component Value Date/Time   NA 128* 04/06/2013 0230   K 4.8 04/06/2013 0230   CL 91* 04/06/2013 0230   CO2 18* 04/06/2013 0230   GLUCOSE 681* 04/06/2013 0230   BUN 51* 04/06/2013 0230   CREATININE 9.60* 04/06/2013 0230   CALCIUM 7.4* 04/06/2013 0230   CALCIUM 8.4 03/06/2011 0751   PROT 6.4 03/28/2013 0500   ALBUMIN 3.0* 03/28/2013 0500   AST 21 03/28/2013 0500   ALT 15 03/28/2013 0500   ALKPHOS 112 03/28/2013 0500   BILITOT 0.4 03/28/2013 0500   GFRNONAA 5* 04/06/2013 0230   GFRAA 5* 04/06/2013 0230    No results found for this basename: LIPASE, AMYLASE,  in the last 168 hours No results found for this basename: AMMONIA,  in the last 168 hours  No results found for this basename: CKTOTAL, CKMB, CKMBINDEX, TROPONINI,  in the last 168 hours BNP (last 3 results) No results found for this basename: PROBNP,  in the last 8760 hours  Radiological Exams on Admission: Dg Chest Port 1 View  04/06/2013   CLINICAL DATA:  Cough and shortness of breath.  EXAM: PORTABLE CHEST - 1 VIEW  COMPARISON:  Chest radiograph performed 03/27/2013  FINDINGS: The lungs are well-aerated. Vascular congestion is noted, with increased interstitial markings, concerning for mild pulmonary edema. There is no evidence of pleural effusion or pneumothorax.  The cardiomediastinal silhouette is mildly enlarged. No acute osseous abnormalities are seen.  IMPRESSION: Vascular congestion  and mild cardiomegaly, with increased interstitial markings,  concerning for mild pulmonary edema.   Electronically Signed   By: Roanna Raider M.D.   On: 04/06/2013 05:13    Assessment/Plan Principal Problem:   DKA (diabetic ketoacidoses) Active Problems:   ESRD (end stage renal disease)   DM (diabetes mellitus), type 1 with renal complications   Acute bronchitis   1. DKA (diabetic ketoacidoses) The patient is presenting with anion gap of 20 and blood sugar more than 600. With this she has been given IV fluid in the ED and IV insulin bolus and has been started on glucose stabilizer. She will be admitted to the step down unit and continued on glucose stabilizer. She will be kept n.p.o.  Possible etiology is ongoing stress related to surgery and bronchitis versus early pneumonia. She'll be given gentle hydration As she has ESRD potassium would not be replaced at present, follow BMP  2. Possible acute bronchitis Pacing the patient on Levaquin Continue home inhalers Oxygen as needed  3. Chronic intractable abdominal pain Abdominal examination appears benign Patient also has complains of pain in her thigh Pain control with IV Dilaudid at present  4. Questionable AV graft infection Patient mentions that she has been getting antibiotics with IV vancomycin, need to discuss the dialysis Center about the course in the dose.   DVT Prophylaxis: subcutaneous Heparin Nutrition: N.p.o.  Code Status: Full  Disposition: Admitted to inpatient in step-down unit.  Author: Lynden Oxford, MD [facility] Pager: [phone] 04/06/2013, 6:16 AM    If 7PM-7AM, please contact night-coverage [email or web address] Password [email or web address]

## 2013-04-06 NOTE — Consult Note (Signed)
[facility] Renal Consultation Note  Indication for Consultation:  Management of ESRD/hemodialysis; anemia, hypertension/volume and secondary hyperparathyroidism  HPI: [name] is a 42 y.o. 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 She had recent admit 10/25-11/01/14 for Colitis with GI follow up recommended  and, not seen yet.Now she only co of R Femoral AVGG pain. She went to Outpt hd on schedule Saturday with reported problem from op RN "except Late for HD". She tells me she is on Cellcept and Prednisone but has not seen her Transplant  MD at  [facility] transplant center as she should. "need to prevent my organs from getting inflamed" per Pt. 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       Past Medical History  Diagnosis Date  . Hyperlipidemia   . Alopecia   . Obesity   . Iron deficiency   . 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 RLS (restless legs syndrome)   . Respiratory failure   . Injury of conjunctiva and corneal abrasion of right eye without foreign body   . Cellulitis   . Dialysis patient     Tues; Thurs; Sat; [place]  . 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 Anemia   . Blood transfusion 2004    "when I had my transplant"  . Migraine   . 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 Hyperparathyroidism, secondary   . Diabetic retinopathy   . Hypertension     takes Metoprolol and Exforge daily  . Depression     takes Klonopin nightly  . Diabetes mellitus type 1     Pt states diagnosed at age 85 with prior episodes of DKA. S/p pancreatic transplant   . GERD  (gastroesophageal reflux disease)     takes Protonix daily  . PONV (postoperative nausea and vomiting)   . Bronchitis   . Migraine   . Asthma   . Shortness of breath   . Emphysema of lung   . DKA (diabetic ketoacidoses) 07/02/2012  . Angina     none in past 2 years.  . Renal insufficiency   . Stomach pain     Hx: of chronic  . Pneumonia 2012    "double"    Past Surgical History  Procedure Laterality Date  . 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 Thyroglobulin      x 7  . Av fistula placement      right upper arm  . Cholecystectomy  1995  .  hd graft placement/removal      "had 2 in my left upper arm"  . Eye surgery    . Retinopathy surgery    . Tooth extraction  10/10/11    X's two  . Insertion of dialysis catheter  12/08/2011    Procedure: INSERTION OF DIALYSIS CATHETER;  Surgeon: Chuck Hint, MD;  Location: [facility] OR;  Service: Vascular;  Laterality: Left;  . Av fistula placement  01/22/2012    Procedure: INSERTION OF ARTERIOVENOUS (AV) GORE-TEX GRAFT ARM;  Surgeon: Chuck Hint, MD;  Location: [facility] OR;  Service: Vascular;  Laterality: Left;  Ultrasound guided.  . Av fistula placement  03/14/2012    Procedure: INSERTION OF ARTERIOVENOUS (AV) GORE-TEX GRAFT THIGH;  Surgeon: Chuck Hint, MD;  Location: [facility] OR;  Service: Vascular;  Laterality: Right;  . False aneurysm repair Right 01/02/2013    Procedure: Excision of lymphocele in right thigh.;  Surgeon: Chuck Hint, MD;  Location: [facility] OR;  Service: Vascular;  Laterality: Right;  . Carpal tunnel release Right 03/02/2013    Procedure: CARPAL TUNNEL RELEASE;  Surgeon: Tami Ribas, MD;  Location: [facility];  Service: Orthopedics;  Laterality: Right;  . Flexible sigmoidoscopy N/A 03/24/2013    Procedure: FLEXIBLE SIGMOIDOSCOPY;  Surgeon: Florencia Reasons, MD;  Location: [facility] ENDOSCOPY;  Service: Endoscopy;  Laterality: N/A;  . Revision of  arteriovenous goretex graft Right 04/02/2013    Procedure: REPAIR OF PSEUDOANEURYSM OF ARTERIOVENOUS GORETEX GRAFT;  Surgeon: Chuck Hint, MD;  Location: [facility] OR;  Service: Vascular;  Laterality: Right;      Family History  Problem Relation Age of Onset  . Hypertension Mother   . Kidney disease Mother   . Diabetes Mother   . Hypertension Father   . Kidney disease Father   . Diabetes Father   Social= single lives with daughter and    reports that she has never smoked. She has never used smokeless tobacco. She reports that she does not drink alcohol or use illicit drugs.   Allergies  Allergen Reactions  . Depakote [Divalproex Sodium] Other (See Comments)    Pt gets "delirious"  . Morphine And Related Nausea And Vomiting and Other (See Comments)    "makes me delirious"  . Penicillins Anaphylaxis  . Tramadol Nausea And Vomiting  . Vicodin [Hydrocodone-Acetaminophen] Itching and Rash    Prior to Admission medications   Medication Sig Start Date End Date Taking? Authorizing Provider  albuterol (PROVENTIL HFA;VENTOLIN HFA) 108 (90 BASE) MCG/ACT inhaler Inhale 2 puffs into the lungs every 4 (four) hours as needed for wheezing or shortness of breath (as per home regimen). 02/20/13  Yes Esperanza Sheets, MD  calcium acetate (PHOSLO) 667 MG capsule Take 2,001-2,668 mg by mouth 3 (three) times daily with meals. Dosage depends on meal consumed 03/12/11  Yes Historical Provider, MD  clonazePAM (KLONOPIN) 0.5 MG tablet Take 1 mg by mouth at bedtime.    Yes Historical Provider, MD  EXFORGE 10-160 MG per tablet Take 1 tablet by mouth at bedtime.  06/15/12  Yes Historical Provider, MD  gabapentin (NEURONTIN) 100 MG capsule Take 100 mg by mouth daily.   Yes Historical Provider, MD  HYDROcodone-acetaminophen (NORCO) 5-325 MG per tablet Take 1 tablet by mouth every 6 (six) hours as needed. 04/02/13  Yes Samantha J Rhyne, PA-C  insulin glargine (LANTUS) 100 UNIT/ML injection Inject 0.07 mLs (7  Units total) into the skin 2 (two) times daily. 03/27/13  Yes Kathlen Mody, MD  metoprolol succinate (TOPROL-XL) 100 MG 24 hr tablet Take 100 mg by mouth at bedtime. Take with or immediately following a meal.   Yes Historical Provider, MD  multivitamin (RENA-VIT) TABS tablet Take 1 tablet by mouth daily.   Yes Historical Provider, MD  mycophenolate (CELLCEPT) 250 MG capsule Take 250 mg by mouth 2 (two) times daily.   Yes Historical Provider, MD  Nutritional Supplements (FEEDING SUPPLEMENT, NEPRO CARB STEADY,) LIQD Take 237 mLs by mouth daily. 03/27/13  Yes Kathlen Mody, MD  pantoprazole (PROTONIX) 40 MG tablet  Take 1 tablet (40 mg total) by mouth at bedtime. 03/27/13  Yes Kathlen Mody, MD  predniSONE (DELTASONE) 5 MG tablet Take 5 mg by mouth daily.  01/21/11  Yes Historical Provider, MD  tacrolimus (PROGRAF) 1 MG capsule Take 1 mg by mouth 2 (two) times daily.   Yes Historical Provider, MD     Anti-infectives   Start     Dose/Rate Route Frequency Ordered Stop   04/08/13 1000  levofloxacin (LEVAQUIN) tablet 500 mg     500 mg Oral Every 48 hours 04/06/13 0941     04/08/13 0000  levofloxacin (LEVAQUIN) IVPB 500 mg  Status:  Discontinued     500 mg 100 mL/hr over 60 Minutes Intravenous Every 48 hours 04/06/13 0726 04/06/13 0940   04/07/13 1200  vancomycin (VANCOCIN) IVPB 750 mg/150 ml premix     750 mg 150 mL/hr over 60 Minutes Intravenous Every T-Th-Sa (Hemodialysis) 04/06/13 0726     04/06/13 1115  levofloxacin (LEVAQUIN) tablet 500 mg     500 mg Oral  Once 04/06/13 1056 04/06/13 1235   04/06/13 0800  vancomycin (VANCOCIN) 1,500 mg in sodium chloride 0.9 % 500 mL IVPB     1,500 mg 250 mL/hr over 120 Minutes Intravenous  Once 04/06/13 0726 04/06/13 1056   04/06/13 0800  levofloxacin (LEVAQUIN) IVPB 750 mg     750 mg 100 mL/hr over 90 Minutes Intravenous  Once 04/06/13 0726        Results for orders placed during the hospital encounter of 04/06/13 (from the past 48 hour(s))  GLUCOSE,  CAPILLARY     Status: Abnormal   Collection Time    04/06/13  2:03 AM      Result Value Range   Glucose-Capillary 594 (*) 70 - 99 mg/dL   Comment 1 Documented in Chart     Comment 2 Notify RN    CBC WITH DIFFERENTIAL     Status: Abnormal   Collection Time    04/06/13  2:30 AM      Result Value Range   WBC 7.2  4.0 - 10.5 K/uL   RBC 3.23 (*) 3.87 - 5.11 MIL/uL   Hemoglobin 10.8 (*) 12.0 - 15.0 g/dL   HCT 95.6 (*) 21.3 - 08.6 %   MCV 104.0 (*) 78.0 - 100.0 fL   MCH 33.4  26.0 - 34.0 pg   MCHC 32.1  30.0 - 36.0 g/dL   RDW 57.8  46.9 - 62.9 %   Platelets 152  150 - 400 K/uL   Neutrophils Relative % 86 (*) 43 - 77 %   Neutro Abs 6.2  1.7 - 7.7 K/uL   Lymphocytes Relative 11 (*) 12 - 46 %   Lymphs Abs 0.8  0.7 - 4.0 K/uL   Monocytes Relative 2 (*) 3 - 12 %   Monocytes Absolute 0.2  0.1 - 1.0 K/uL   Eosinophils Relative 1  0 - 5 %   Eosinophils Absolute 0.1  0.0 - 0.7 K/uL   Basophils Relative 0  0 - 1 %   Basophils Absolute 0.0  0.0 - 0.1 K/uL  BASIC METABOLIC PANEL     Status: Abnormal   Collection Time    04/06/13  2:30 AM      Result Value Range   Sodium 128 (*) 135 - 145 mEq/L   Potassium 4.8  3.5 - 5.1 mEq/L   Chloride 91 (*) 96 - 112 mEq/L   CO2 18 (*) 19 - 32 mEq/L  Glucose, Bld 681 (*) 70 - 99 mg/dL   Comment: CRITICAL RESULT CALLED TO, READ BACK BY AND VERIFIED WITH:     Wende Neighbors (RN) [phone] 04/06/2013 L. LOMAX   BUN 51 (*) 6 - 23 mg/dL   Creatinine, Ser 5.62 (*) 0.50 - 1.10 mg/dL   Calcium 7.4 (*) 8.4 - 10.5 mg/dL   GFR calc non Af Amer 5 (*) >90 mL/min   GFR calc Af Amer 5 (*) >90 mL/min   Comment: (NOTE)     The eGFR has been calculated using the CKD EPI equation.     This calculation has not been validated in all clinical situations.     eGFR's persistently <90 mL/min signify possible Chronic Kidney     Disease.  GLUCOSE, CAPILLARY     Status: Abnormal   Collection Time    04/06/13  4:20 AM      Result Value Range   Glucose-Capillary >600 (*) 70 - 99 mg/dL   GLUCOSE, CAPILLARY     Status: Abnormal   Collection Time    04/06/13  5:26 AM      Result Value Range   Glucose-Capillary 517 (*) 70 - 99 mg/dL   Comment 1 Notify RN    GLUCOSE, CAPILLARY     Status: Abnormal   Collection Time    04/06/13  6:24 AM      Result Value Range   Glucose-Capillary 393 (*) 70 - 99 mg/dL  MRSA PCR SCREENING     Status: None   Collection Time    04/06/13  6:51 AM      Result Value Range   MRSA by PCR NEGATIVE  NEGATIVE   Comment:            The GeneXpert MRSA Assay (FDA     approved for NASAL specimens     only), is one component of a     comprehensive MRSA colonization     surveillance program. It is not     intended to diagnose MRSA     infection nor to guide or     monitor treatment for     MRSA infections.  GLUCOSE, CAPILLARY     Status: Abnormal   Collection Time    04/06/13  7:27 AM      Result Value Range   Glucose-Capillary 295 (*) 70 - 99 mg/dL  GLUCOSE, CAPILLARY     Status: Abnormal   Collection Time    04/06/13  8:38 AM      Result Value Range   Glucose-Capillary 254 (*) 70 - 99 mg/dL  CBC     Status: Abnormal   Collection Time    04/06/13  8:40 AM      Result Value Range   WBC 6.6  4.0 - 10.5 K/uL   RBC 3.00 (*) 3.87 - 5.11 MIL/uL   Hemoglobin 9.7 (*) 12.0 - 15.0 g/dL   HCT 13.0 (*) 86.5 - 78.4 %   MCV 101.3 (*) 78.0 - 100.0 fL   MCH 32.3  26.0 - 34.0 pg   MCHC 31.9  30.0 - 36.0 g/dL   RDW 69.6  29.5 - 28.4 %   Platelets 152  150 - 400 K/uL  .  ROS: Reports subjective fevers, and chills at home and pain in her Hd Access for weeks. See hpi for other positives.   Physical Exam: Filed Vitals:   04/06/13 1045  BP: 142/77  Pulse: 70  Temp: 97.9 F (36.6 C)  Resp:  16     General: Alert bf nad ,talkative , her normal ms HEENT: [place], MMM Eyes: eomi , nonicteric Neck: Supple, no jvd Heart:  RRR, no rub or murmur  Lungs: Clear bilaterally , no rales, wheezing  Abdomen: BS pos. Normal , soft ,Minall y  Tender epigastric  area Extremities:  Trave bipedal edema Skin: No overt rash or ulcers. Scattered tattoos back , upper arms  Neuro: Alert Ox3, moving all extrem . No acute deficts Dialysis Access: Pos. Bruit R Fem Avgg . minimal induration mildly tender   Dialysis Orders: Center: [facility]  on TTS . EDW 58.5kg HD , 3 hrs  Time ,  [place] 2.o k , 2.5 ca Heparin 0. Access R Fem AVGG BFR 400 DFR auto flow 1.5    Hectorol1.0 mcg  mcg IV/HD Epogen 10000   Units IV/HD  Venofer  0   Last op labs 02/23/13 = tfs=24 % / ferritin 1750 =12/18/12/ ipth 213 02/23/13  Assessment/Plan 1. DKA = per Admitt  Team. She tells me she needs a NEW sliding scale when dc home.Will dw with Dr. Marisue Humble taper off Cellcept/ prograf, and Prednisone with recent admits with DKA and risk of infection can dc these immune suppressive meds  2. SP R Fem AVGG Pseudoaneurysm  Repair/ resection = no dc from area/ no mention of infection with 11/06 surgery/ wbc normal/ on vanco/ iv levaquin 3. Recurrent Abdominal pain - per admit  4. ESRD -  HD TTS ( nw)  K 4.8  5. Hypertension/volume  -  At edw by wts, no sob with cxr showing some vol. in am hd uf 2.5 to 3.5 liters 6. Anemia  - hgb 9.2 Aranesp 150 mcg on hd , no venofer 7. Metabolic bone disease -   Vit d and binders 8. Nutrition - Carb mod . Renal diet, Renal Vit.  Lenny Pastel, PA-C [facility] Beeper [phone] 04/06/2013, 11:35 AM

## 2013-04-06 NOTE — Progress Notes (Signed)
INITIAL NUTRITION ASSESSMENT  DOCUMENTATION CODES Per approved criteria  -Severe malnutrition in the context of acute illness or injury   INTERVENTION: 1. Nepro shakes TID, 8 oz provides 425 kcal, 19 g protein 2. Encourage adequate intake  NUTRITION DIAGNOSIS: Inadequate oral intake related to nausea, abdominal pain as evidenced by 0% intake.   Goal: Patient will meet >/=90% of estimated nutrition needs  Monitor:  PO intake, weight, labs, I/Os  Reason for Assessment: Malnutrition screening tool  42 y.o. female  Admitting Dx: DKA (diabetic ketoacidoses)  ASSESSMENT: Patient with history of ESRD on HD, DM, with recent admission on 10/25 for GI bleed. She reports some nausea, vomiting, and abdominal pain, and is not eating much of her meals. She does report drinking Nepro shakes three times daily at home, and would like to get this here. She has lost about 5% of her weight in the last 2.5 weeks, with poor intake. No muscle or fat tissue depletions.   Patient meets the criteria for severe MALNUTRITION in the context of acute illness with 5% weight loss in 2.5 wees and PO intake <75% of estimated needs.    Height: Ht Readings from Last 1 Encounters:  04/06/13 5\' 1"  (1.549 m)    Weight: Wt Readings from Last 1 Encounters:  04/06/13 145 lb (65.772 kg)    Ideal Body Weight: 105 pounds  % Ideal Body Weight: 138%  Wt Readings from Last 10 Encounters:  04/06/13 145 lb (65.772 kg)  04/01/13 145 lb (65.772 kg)  04/01/13 145 lb (65.772 kg)  03/28/13 151 lb 7.3 oz (68.7 kg)  03/28/13 151 lb 7.3 oz (68.7 kg)  03/28/13 151 lb 7.3 oz (68.7 kg)  03/19/13 151 lb 7.3 oz (68.7 kg)  03/19/13 151 lb 7.3 oz (68.7 kg)  03/02/13 151 lb 3.2 oz (68.584 kg)  03/02/13 151 lb 3.2 oz (68.584 kg)    Usual Body Weight: 151 pounds  % Usual Body Weight: 95%  BMI:  Body mass index is 27.41 kg/(m^2). Patient is overweight.   Estimated Nutritional Needs: Kcal: 1650-1800 kcal Protein: 85-100  g Fluid: Per MD  Skin: Incisions right thigh and hand, trace bilateral pedal edema  Diet Order: Renal 80/90  EDUCATION NEEDS: -No education needs identified at this time   Intake/Output Summary (Last 24 hours) at 04/06/13 1320 Last data filed at 04/06/13 1000  Gross per 24 hour  Intake 319.07 ml  Output      0 ml  Net 319.07 ml    Last BM: PTA   Labs:   Recent Labs Lab 04/02/13 1203 04/06/13 0230  NA 132* 128*  K 4.3 4.8  CL  --  91*  CO2  --  18*  BUN  --  51*  CREATININE  --  9.60*  CALCIUM  --  7.4*  GLUCOSE 378* 681*    CBG (last 3)   Recent Labs  04/06/13 0727 04/06/13 0838 04/06/13 1217  GLUCAP 295* 254* 157*    Scheduled Meds: . amLODipine  5 mg Oral Daily  . calcium acetate  2,001-2,668 mg Oral TID WC  . clonazePAM  1 mg Oral QHS  . heparin  5,000 Units Subcutaneous Q8H  . insulin aspart  0-15 Units Subcutaneous TID WC  . insulin aspart  0-5 Units Subcutaneous QHS  . insulin glargine  10 Units Subcutaneous QHS  . levofloxacin (LEVAQUIN) IV  750 mg Intravenous Once  . [START ON 04/08/2013] levofloxacin  500 mg Oral Q48H  . metoprolol succinate  100 mg Oral QHS  . multivitamin  1 tablet Oral QHS  . mycophenolate  250 mg Oral BID  . predniSONE  5 mg Oral Q breakfast  . tacrolimus  1 mg Oral BID  . [START ON 04/07/2013] vancomycin  750 mg Intravenous Q T,Th,Sa-HD    Continuous Infusions:   Past Medical History  Diagnosis Date  . Hyperlipidemia   . Alopecia   . Obesity   . Iron deficiency   . 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 RLS (restless legs syndrome)   . Respiratory failure   . Injury of conjunctiva and corneal abrasion of right eye without foreign body   . Cellulitis   . Dialysis patient     Tues; Thurs; Sat; [place]  . 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 Anemia   . Blood transfusion 2004    "when I had my  transplant"  . Migraine   . 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 Hyperparathyroidism, secondary   . Diabetic retinopathy   . Hypertension     takes Metoprolol and Exforge daily  . Depression     takes Klonopin nightly  . Diabetes mellitus type 1     Pt states diagnosed at age 59 with prior episodes of DKA. S/p pancreatic transplant   . GERD (gastroesophageal reflux disease)     takes Protonix daily  . PONV (postoperative nausea and vomiting)   . Bronchitis   . Migraine   . Asthma   . Shortness of breath   . Emphysema of lung   . DKA (diabetic ketoacidoses) 07/02/2012  . Angina     none in past 2 years.  . Renal insufficiency   . Stomach pain     Hx: of chronic  . Pneumonia 2012    "double"    Past Surgical History  Procedure Laterality Date  . 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 Thyroglobulin      x 7  . Av fistula placement      right upper arm  . Cholecystectomy  1995  .  hd graft placement/removal      "had 2 in my left upper arm"  . Eye surgery    . Retinopathy surgery    . Tooth extraction  10/10/11    X's two  . Insertion of dialysis catheter  12/08/2011    Procedure: INSERTION OF DIALYSIS CATHETER;  Surgeon: Chuck Hint, MD;  Location: [facility] OR;  Service: Vascular;  Laterality: Left;  . Av fistula placement  01/22/2012    Procedure: INSERTION OF ARTERIOVENOUS (AV) GORE-TEX GRAFT ARM;  Surgeon: Chuck Hint, MD;  Location: [facility] OR;  Service: Vascular;  Laterality: Left;  Ultrasound guided.  . Av fistula placement  03/14/2012    Procedure: INSERTION OF ARTERIOVENOUS (AV) GORE-TEX GRAFT THIGH;  Surgeon: Chuck Hint, MD;  Location: [facility] OR;  Service: Vascular;  Laterality: Right;  . False aneurysm repair Right 01/02/2013    Procedure: Excision of lymphocele in right thigh.;  Surgeon: Chuck Hint, MD;  Location: [facility] OR;  Service: Vascular;  Laterality: Right;  . Carpal  tunnel release Right 03/02/2013    Procedure: CARPAL TUNNEL RELEASE;  Surgeon: Tami Ribas, MD;  Location: [facility];  Service: Orthopedics;  Laterality: Right;  . Flexible  sigmoidoscopy N/A 03/24/2013    Procedure: FLEXIBLE SIGMOIDOSCOPY;  Surgeon: Florencia Reasons, MD;  Location: [facility] ENDOSCOPY;  Service: Endoscopy;  Laterality: N/A;  . Revision of arteriovenous goretex graft Right 04/02/2013    Procedure: REPAIR OF PSEUDOANEURYSM OF ARTERIOVENOUS GORETEX GRAFT;  Surgeon: Chuck Hint, MD;  Location: [facility] OR;  Service: Vascular;  Laterality: Right;    Linnell Fulling, RD, LDN Pager #: [phone] After-Hours Pager #: [phone]

## 2013-04-06 NOTE — Progress Notes (Signed)
Antibiotic will not infuse. EJ positional. Dr. Truitt Merle.

## 2013-04-06 NOTE — Progress Notes (Signed)
EJ leaking. IV team paged for PIV start and Dr. York Spaniel notified.

## 2013-04-06 NOTE — ED Notes (Addendum)
Lab called to inquire about CMP blood draw. Informed blood is in process.

## 2013-04-07 LAB — BASIC METABOLIC PANEL
Chloride: 98 mEq/L (ref 96–112)
GFR calc Af Amer: 4 mL/min — ABNORMAL LOW (ref >90)
GFR calc non Af Amer: 4 mL/min — ABNORMAL LOW (ref >90)
Potassium: 4.5 mEq/L (ref 3.5–5.1)
Sodium: 133 mEq/L — ABNORMAL LOW (ref 135–145)

## 2013-04-07 LAB — GLUCOSE, CAPILLARY
Glucose-Capillary: 160 mg/dL — ABNORMAL HIGH (ref 70–99)
Glucose-Capillary: 263 mg/dL — ABNORMAL HIGH (ref 70–99)
Glucose-Capillary: 105 mg/dL — ABNORMAL HIGH (ref 70–99)

## 2013-04-07 MED ORDER — INSULIN GLARGINE 100 UNIT/ML ~~LOC~~ SOLN
15.0000 [IU] | Freq: Every day | SUBCUTANEOUS | Status: DC
  Administered 2013-04-07: 15 [IU] via SUBCUTANEOUS
  Filled 2013-04-07 (×2): qty 0.15

## 2013-04-07 MED ORDER — HEPARIN SODIUM (PORCINE) 1000 UNIT/ML DIALYSIS
1000.0000 [IU] | INTRAMUSCULAR | Status: DC | PRN
  Filled 2013-04-07: qty 1

## 2013-04-07 MED ORDER — SODIUM CHLORIDE 0.9 % IV SOLN: 100.0000 mL | INTRAVENOUS | Status: DC | PRN

## 2013-04-07 MED ORDER — DOCUSATE SODIUM 283 MG RE ENEM: 1.0000 | ENEMA | RECTAL | Status: DC | PRN

## 2013-04-07 MED ORDER — CAMPHOR-MENTHOL 0.5-0.5 % EX LOTN
1.0000 "application " | TOPICAL_LOTION | Freq: Three times a day (TID) | CUTANEOUS | Status: DC | PRN
  Filled 2013-04-07: qty 222

## 2013-04-07 MED ORDER — SORBITOL 70 % SOLN: 30.0000 mL | Status: DC | PRN

## 2013-04-07 MED ORDER — CALCIUM CARBONATE 1250 MG/5ML PO SUSP: 500.0000 mg | Freq: Four times a day (QID) | ORAL | Status: DC | PRN

## 2013-04-07 MED ORDER — PENTAFLUOROPROP-TETRAFLUOROETH EX AERO: 1.0000 "application " | INHALATION_SPRAY | CUTANEOUS | Status: DC | PRN

## 2013-04-07 MED ORDER — ONDANSETRON HCL 4 MG PO TABS: 4.0000 mg | ORAL_TABLET | Freq: Four times a day (QID) | ORAL | Status: DC | PRN

## 2013-04-07 MED ORDER — NEPRO/CARBSTEADY PO LIQD: 237.0000 mL | Freq: Three times a day (TID) | ORAL | Status: DC | PRN

## 2013-04-07 MED ORDER — NEPRO/CARBSTEADY PO LIQD: 237.0000 mL | ORAL | Status: DC | PRN

## 2013-04-07 MED ORDER — HYDROXYZINE HCL 25 MG PO TABS
25.0000 mg | ORAL_TABLET | Freq: Three times a day (TID) | ORAL | Status: DC | PRN
  Administered 2013-04-09 – 2013-04-11 (×2): 25 mg via ORAL
  Filled 2013-04-07: qty 1

## 2013-04-07 MED ORDER — HYDROMORPHONE HCL 2 MG PO TABS
ORAL_TABLET | ORAL | Status: AC
  Administered 2013-04-07: 2 mg via ORAL
  Filled 2013-04-07: qty 1

## 2013-04-07 MED ORDER — ZOLPIDEM TARTRATE 5 MG PO TABS
5.0000 mg | ORAL_TABLET | Freq: Every evening | ORAL | Status: DC | PRN
  Administered 2013-04-07 – 2013-04-10 (×4): 5 mg via ORAL
  Filled 2013-04-07 (×4): qty 1

## 2013-04-07 MED ORDER — LIDOCAINE HCL (PF) 1 % IJ SOLN: 5.0000 mL | INTRAMUSCULAR | Status: DC | PRN

## 2013-04-07 MED ORDER — ONDANSETRON HCL 4 MG/2ML IJ SOLN
4.0000 mg | Freq: Four times a day (QID) | INTRAMUSCULAR | Status: DC | PRN
  Administered 2013-04-08 – 2013-04-11 (×7): 4 mg via INTRAVENOUS
  Filled 2013-04-07 (×5): qty 2

## 2013-04-07 MED ORDER — INSULIN ASPART 100 UNIT/ML ~~LOC~~ SOLN
3.0000 [IU] | Freq: Three times a day (TID) | SUBCUTANEOUS | Status: DC
  Administered 2013-04-07 – 2013-04-10 (×4): 3 [IU] via SUBCUTANEOUS

## 2013-04-07 MED ORDER — LIDOCAINE-PRILOCAINE 2.5-2.5 % EX CREA: 1.0000 "application " | TOPICAL_CREAM | CUTANEOUS | Status: DC | PRN

## 2013-04-07 NOTE — Procedures (Signed)
I was present at this dialysis session. I have reviewed the session itself and made appropriate changes.   Qb 400 AVG and goal UF 5L.  Pt with pain at access site o/w doing well.  Glycemic control improved.    Soft tissue ultrasoudn with some edema but no clear fluid collection or abscess.  I suspect this is post-surgical in nature.    I spoke with Dr.Stratta at [facility] by email and discussed immunosuppression.  Plan is for MMF to be stopped now, Tac in 1 mo to be tapered off and and prednisone over next 3-6 month.    Sabra Heck  MD 04/07/2013, 9:12 AM

## 2013-04-07 NOTE — Progress Notes (Signed)
[facility] PROGRESS NOTE  [name] ZOX:[id number] DOB: September 16, 1970 DOA: 04/06/2013 PCP: [name], MD  Assessment/Plan:  42 y.o. female with Past medical history of ESRD s/p renal transplant, but HD, recently underwent pseudoaneurysm repair of the fistula site, DM, HTN, HPL, anemia, depression, COPD, chronic intractable abdominal pain, immunosuppression with prednisone, GERD presented with abdominal pain found to be in DKA   1. DKA likely due to steroids; HA 1c-10 (02/2013); resolved DKA;  -increase lantus to 15 U; novolog 3 units with meals, +ISS; titrate as needed; monitor   -per nephrology: Dr.Stratta at [facility] by email and discussed immunosuppression. Plan is for MMF to be stopped now, Tac in 1 mo to be tapered off and and prednisone over next 3-6 month" (patient reports taking steroids for renal transplant, but on HD for the last 6 years?)  2. ESRD  s/p renal  transplant, but HD; CXR: pulmonary edema; as above;  -recent underwent pseudoaneurysm repair of the fistula site; [place]: soft tissue edema, no abscess; cont monitor   3. Anemia AoCD; no s/s of acute bleeding; cont monitoring   4. HTN not at goal; resume home meds/titrtate  Add amlodipine   5. Chronic pain, cont pain control;   6. COPD/Bronchitis; cont bronchodilators, oxygen, atx  Code Status: full Family Communication: none at the bedside (indicate person spoken with, relationship, and if by phone, the number) Disposition Plan: home when ready likely in AM    Consultants:  Nephrology   Procedures:  HD  Antibiotics:  levoflox 11/10<<<< (indicate start date, and stop date if known)  HPI/Subjective: alert  Objective: Filed Vitals:   04/07/13 1227  BP: 121/67  Pulse: 71  Temp: 98.1 F (36.7 C)  Resp: 16    Intake/Output Summary (Last 24 hours) at 04/07/13 1355 Last data filed at 04/07/13 1225  Gross per 24 hour  Intake    600 ml  Output   4872 ml  Net  -4272 ml   Filed  Weights   04/06/13 2105 04/07/13 0715 04/07/13 1115  Weight: 65.772 kg (145 lb) 73.6 kg (162 lb 4.1 oz) 68.2 kg (150 lb 5.7 oz)    Exam:   General:  Alert   Cardiovascular: s1,s2 rrr  Respiratory:  Few crackles in LL  Abdomen: soft, nt , nd   Musculoskeletal: no LE edema    Data Reviewed: Basic Metabolic Panel:  Recent Labs Lab 04/02/13 1203 04/06/13 0230 04/07/13 0650  NA 132* 128* 133*  K 4.3 4.8 4.5  CL  --  91* 98  CO2  --  18* 18*  GLUCOSE 378* 681* 170*  BUN  --  51* 64*  CREATININE  --  9.60* 11.38*  CALCIUM  --  7.4* 7.5*   Liver Function Tests: No results found for this basename: AST, ALT, ALKPHOS, BILITOT, PROT, ALBUMIN,  in the last 168 hours No results found for this basename: LIPASE, AMYLASE,  in the last 168 hours No results found for this basename: AMMONIA,  in the last 168 hours CBC:  Recent Labs Lab 04/02/13 1203 04/06/13 0230 04/06/13 0840  WBC  --  7.2 6.6  NEUTROABS  --  6.2  --   HGB 11.9* 10.8* 9.7*  HCT 35.0* 33.6* 30.4*  MCV  --  104.0* 101.3*  PLT  --  152 152   Cardiac Enzymes: No results found for this basename: CKTOTAL, CKMB, CKMBINDEX, TROPONINI,  in the last 168 hours BNP (last 3 results) No results found for this basename:  PROBNP,  in the last 8760 hours CBG:  Recent Labs Lab 04/06/13 0838 04/06/13 1217 04/06/13 1635 04/06/13 2108 04/07/13 1221  GLUCAP 254* 157* 117* 374* 160*    Recent Results (from the past 240 hour(s))  MRSA PCR SCREENING     Status: None   Collection Time    04/06/13  6:51 AM      Result Value Range Status   MRSA by PCR NEGATIVE  NEGATIVE Final   Comment:            The GeneXpert MRSA Assay (FDA     approved for NASAL specimens     only), is one component of a     comprehensive MRSA colonization     surveillance program. It is not     intended to diagnose MRSA     infection nor to guide or     monitor treatment for     MRSA infections.     Studies: [place] Misc Soft  Tissue  04/06/2013   CLINICAL DATA:  Right thigh graft pain for several weeks. Assess for infection or abscess.  EXAM: LOWER EXTREMITY LIMITED SOFT TISSUE ULTRASOUND.  TECHNIQUE: Ultrasound examination of the lower extremity soft tissues was performed in the area of clinical concern.  COMPARISON:  None  FINDINGS: A focus of decreased echogenicity along the lateral aspect of the superior graft is thought to reflect vague fluid in association with soft tissue edema, without definite evidence of a well-defined abscess. There is also additional soft tissue edema adjacent to the graft, along the anterior aspect of the inferior medial graft. Wall plaque is noted along the graft; it remains fully patent.  IMPRESSION: Vague focus of fluid in association with mild soft tissue edema about the patient's right thigh graft, without definite evidence of a well-defined abscess. Underlying infection cannot be entirely excluded, given the patient's symptoms.   Electronically Signed   By: Roanna Raider M.D.   On: 04/06/2013 21:35   Dg Chest Port 1 View  04/06/2013   CLINICAL DATA:  Cough and shortness of breath.  EXAM: PORTABLE CHEST - 1 VIEW  COMPARISON:  Chest radiograph performed 03/27/2013  FINDINGS: The lungs are well-aerated. Vascular congestion is noted, with increased interstitial markings, concerning for mild pulmonary edema. There is no evidence of pleural effusion or pneumothorax.  The cardiomediastinal silhouette is mildly enlarged. No acute osseous abnormalities are seen.  IMPRESSION: Vascular congestion and mild cardiomegaly, with increased interstitial markings, concerning for mild pulmonary edema.   Electronically Signed   By: Roanna Raider M.D.   On: 04/06/2013 05:13    Scheduled Meds: . amLODipine  5 mg Oral Daily  . calcium acetate  2,001-2,668 mg Oral TID WC  . clonazePAM  1 mg Oral QHS  . docusate sodium  100 mg Oral BID  . feeding supplement (NEPRO CARB STEADY)  237 mL Oral TID WC  . heparin   5,000 Units Subcutaneous Q8H  . insulin aspart  0-15 Units Subcutaneous TID WC  . insulin aspart  0-5 Units Subcutaneous QHS  . insulin glargine  10 Units Subcutaneous QHS  . levofloxacin (LEVAQUIN) IV  750 mg Intravenous Once  . [START ON 04/08/2013] levofloxacin  500 mg Oral Q48H  . metoprolol succinate  100 mg Oral QHS  . multivitamin  1 tablet Oral QHS  . predniSONE  5 mg Oral Q breakfast  . senna  1 tablet Oral Daily  . tacrolimus  1 mg Oral BID  . vancomycin  750  mg Intravenous Q T,Th,Sa-HD   Continuous Infusions:    Principal Problem:   DKA (diabetic ketoacidoses) Active Problems:   ESRD (end stage renal disease)   DM (diabetes mellitus), type 1 with renal complications   Acute bronchitis   Protein-calorie malnutrition, severe    Time spent: >35 minutes     Esperanza Sheets  [facility] Pager [phone]. If 7PM-7AM, please contact night-coverage at [email or web address], password [facility] 04/07/2013, 1:55 PM  LOS: 1 day

## 2013-04-07 NOTE — Progress Notes (Signed)
Utilization review completed.  

## 2013-04-08 DIAGNOSIS — D631 Anemia in chronic kidney disease: Secondary | ICD-10-CM

## 2013-04-08 DIAGNOSIS — K219 Gastro-esophageal reflux disease without esophagitis: Secondary | ICD-10-CM

## 2013-04-08 DIAGNOSIS — N186 End stage renal disease: Secondary | ICD-10-CM

## 2013-04-08 DIAGNOSIS — J209 Acute bronchitis, unspecified: Secondary | ICD-10-CM

## 2013-04-08 DIAGNOSIS — N189 Chronic kidney disease, unspecified: Secondary | ICD-10-CM

## 2013-04-08 DIAGNOSIS — E1110 Type 2 diabetes mellitus with ketoacidosis without coma: Secondary | ICD-10-CM

## 2013-04-08 DIAGNOSIS — R109 Unspecified abdominal pain: Secondary | ICD-10-CM

## 2013-04-08 LAB — GLUCOSE, CAPILLARY
Glucose-Capillary: 60 mg/dL — ABNORMAL LOW (ref 70–99)
Glucose-Capillary: 86 mg/dL (ref 70–99)
Glucose-Capillary: 110 mg/dL — ABNORMAL HIGH (ref 70–99)
Glucose-Capillary: 45 mg/dL — ABNORMAL LOW (ref 70–99)
Glucose-Capillary: 190 mg/dL — ABNORMAL HIGH (ref 70–99)

## 2013-04-08 MED ORDER — HYDROMORPHONE HCL PF 1 MG/ML IJ SOLN
0.5000 mg | INTRAMUSCULAR | Status: DC | PRN
  Administered 2013-04-08 – 2013-04-10 (×8): 1 mg via INTRAVENOUS
  Filled 2013-04-08 (×8): qty 1

## 2013-04-08 MED ORDER — INSULIN GLARGINE 100 UNIT/ML ~~LOC~~ SOLN
10.0000 [IU] | Freq: Every day | SUBCUTANEOUS | Status: DC
  Administered 2013-04-08: 10 [IU] via SUBCUTANEOUS
  Filled 2013-04-08 (×2): qty 0.1

## 2013-04-08 MED ORDER — DEXTROSE 50 % IV SOLN
25.0000 mL | Freq: Once | INTRAVENOUS | Status: AC | PRN
  Administered 2013-04-08: 25 mL via INTRAVENOUS

## 2013-04-08 MED ORDER — HYDROMORPHONE HCL PF 1 MG/ML IJ SOLN
INTRAMUSCULAR | Status: AC
  Filled 2013-04-08: qty 1

## 2013-04-08 MED ORDER — HYDROMORPHONE HCL PF 1 MG/ML IJ SOLN: 1.0000 mg | INTRAMUSCULAR | Status: DC | PRN

## 2013-04-08 NOTE — Progress Notes (Signed)
[facility] PROGRESS NOTE  [name] BMW:[id number] DOB: 1970/07/25 DOA: 04/06/2013 PCP: [name], MD  Assessment/Plan: 42 y.o. female with Past medical history of ESRD s/p renal transplant, but HD, recently underwent pseudoaneurysm repair of the fistula site, DM, HTN, HPL, anemia, depression, COPD, chronic intractable abdominal pain, immunosuppression with prednisone, GERD presented with abdominal pain found to be in DKA   1. DKA likely due to steroids; HA 1c-10 (02/2013); resolved DKA;  -Pt having hypoglycemia, decrease lantus to 10 units. Hypoglycemia precautions; novolog 3 units with meals, +ISS; titrate as needed; monitor  -per nephrology: Dr[facility]Stratta at [facility] by email and discussed immunosuppression. Plan is for MMF to be stopped now, Tac in 1 mo to be tapered off and and prednisone over next 3-6 month" (patient reports taking steroids for renal transplant, but on HD for the last 6 years?)    2. ESRD s/p renal transplant, but HD; CXR: pulmonary edema; as above;  -recent underwent pseudoaneurysm repair of the fistula site; [place]: soft tissue edema, no abscess; cont monitor  - Post op s/p right AVG aneurysm repair, with reported persistent pain, vascular surgeon called to evaluate, control pain  3. Anemia AoCD; no s/s of acute bleeding; cont monitoring  4. HTN not at goal; resume home meds/titrtate Add amlodipine  5. Chronic pain, cont pain control;  6. COPD/Bronchitis; cont bronchodilators, oxygen, abx   Code Status: full  Family Communication: none at the bedside    HPI/Subjective: Pt reporting persistent pain at site of right AVG.    Objective: Filed Vitals:   04/08/13 0440  BP: 128/63  Pulse: 72  Temp: 97.9 F (36.6 C)  Resp: 18    Intake/Output Summary (Last 24 hours) at 04/08/13 1253 Last data filed at 04/08/13 0443  Gross per 24 hour  Intake    480 ml  Output      0 ml  Net    480 ml   Filed Weights   04/07/13 0715 04/07/13 1115  04/07/13 2031  Weight: 162 lb 4.1 oz (73.6 kg) 150 lb 5.7 oz (68.2 kg) 150 lb 5.7 oz (68.201 kg)    Exam:  General: Alert  Cardiovascular: s1,s2 rrr  Respiratory: bBS clear  Abdomen: soft, nt , nd  Musculoskeletal: Right AVG site tender, no signs of erythema or drainage.    Data Reviewed: Basic Metabolic Panel:  Recent Labs Lab 04/02/13 1203 04/06/13 0230 04/07/13 0650  NA 132* 128* 133*  K 4.3 4.8 4.5  CL  --  91* 98  CO2  --  18* 18*  GLUCOSE 378* 681* 170*  BUN  --  51* 64*  CREATININE  --  9.60* 11.38*  CALCIUM  --  7.4* 7.5*   Liver Function Tests: No results found for this basename: AST, ALT, ALKPHOS, BILITOT, PROT, ALBUMIN,  in the last 168 hours No results found for this basename: LIPASE, AMYLASE,  in the last 168 hours No results found for this basename: AMMONIA,  in the last 168 hours CBC:  Recent Labs Lab 04/02/13 1203 04/06/13 0230 04/06/13 0840  WBC  --  7.2 6.6  NEUTROABS  --  6.2  --   HGB 11.9* 10.8* 9.7*  HCT 35.0* 33.6* 30.4*  MCV  --  104.0* 101.3*  PLT  --  152 152   Cardiac Enzymes: No results found for this basename: CKTOTAL, CKMB, CKMBINDEX, TROPONINI,  in the last 168 hours BNP (last 3 results) No results found for this basename: PROBNP,  in the  last 8760 hours CBG:  Recent Labs Lab 04/07/13 2034 04/08/13 0754 04/08/13 0910 04/08/13 1118 04/08/13 1150  GLUCAP 105* 60* 86 45* 45*    Recent Results (from the past 240 hour(s))  MRSA PCR SCREENING     Status: None   Collection Time    04/06/13  6:51 AM      Result Value Range Status   MRSA by PCR NEGATIVE  NEGATIVE Final   Comment:            The GeneXpert MRSA Assay (FDA     approved for NASAL specimens     only), is one component of a     comprehensive MRSA colonization     surveillance program. It is not     intended to diagnose MRSA     infection nor to guide or     monitor treatment for     MRSA infections.     Studies: [place] Misc Soft Tissue  04/06/2013    CLINICAL DATA:  Right thigh graft pain for several weeks. Assess for infection or abscess.  EXAM: LOWER EXTREMITY LIMITED SOFT TISSUE ULTRASOUND.  TECHNIQUE: Ultrasound examination of the lower extremity soft tissues was performed in the area of clinical concern.  COMPARISON:  None  FINDINGS: A focus of decreased echogenicity along the lateral aspect of the superior graft is thought to reflect vague fluid in association with soft tissue edema, without definite evidence of a well-defined abscess. There is also additional soft tissue edema adjacent to the graft, along the anterior aspect of the inferior medial graft. Wall plaque is noted along the graft; it remains fully patent.  IMPRESSION: Vague focus of fluid in association with mild soft tissue edema about the patient's right thigh graft, without definite evidence of a well-defined abscess. Underlying infection cannot be entirely excluded, given the patient's symptoms.   Electronically Signed   By: Roanna Raider M.D.   On: 04/06/2013 21:35    Scheduled Meds: . amLODipine  5 mg Oral Daily  . calcium acetate  2,001-2,668 mg Oral TID WC  . clonazePAM  1 mg Oral QHS  . docusate sodium  100 mg Oral BID  . feeding supplement (NEPRO CARB STEADY)  237 mL Oral TID WC  . heparin  5,000 Units Subcutaneous Q8H  . insulin aspart  0-15 Units Subcutaneous TID WC  . insulin aspart  3 Units Subcutaneous TID WC  . insulin glargine  10 Units Subcutaneous QHS  . levofloxacin (LEVAQUIN) IV  750 mg Intravenous Once  . levofloxacin  500 mg Oral Q48H  . metoprolol succinate  100 mg Oral QHS  . multivitamin  1 tablet Oral QHS  . predniSONE  5 mg Oral Q breakfast  . senna  1 tablet Oral Daily  . tacrolimus  1 mg Oral BID  . vancomycin  750 mg Intravenous Q T,Th,Sa-HD   Continuous Infusions:   Principal Problem:   DKA (diabetic ketoacidoses) Active Problems:   ESRD (end stage renal disease)   DM (diabetes mellitus), type 1 with renal complications   Acute  bronchitis   Protein-calorie malnutrition, severe    [employer] [phone]. If 7PM-7AM, please contact night-coverage at [email or web address], password [facility] 04/08/2013, 12:53 PM  LOS: 2 days

## 2013-04-08 NOTE — Progress Notes (Signed)
I saw the patient and agree with the above assessment and plan.    I have spoken with [facility] regarding immunosuppression.  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  We have come to the decision to stop MMF now.  In one month she can taper off of tacrolimus (I suggest going to qday from BID for 1 week, then reduce dose by 50% for one week, then stop).  Prednisone should be the last to come off.  I would not change dose at all currently and only in 3-6 months after the other two meds are stopped and patient is stable.    Pt with ongoing pain at thigh AVG.  Will ask [facility] to eval.

## 2013-04-08 NOTE — Progress Notes (Signed)
Hypoglycemic Event  CBG: 60  Treatment: 15 GM carbohydrate snack  Symptoms: None  Follow-up CBG: Time: 9:10 CBG Result: 86  Possible Reasons for Event: Medication regimen: Too much Lantus insulin at bedtime  Comments/MD notified: MD Laural Benes notified    [name] L  Remember to initiate Hypoglycemia Order Set & complete

## 2013-04-08 NOTE — Progress Notes (Signed)
Pt refused for labs to be drawn today, wants to wait and have labs drawn during HD tomorrow. MD notified.

## 2013-04-08 NOTE — Progress Notes (Signed)
Inpatient Diabetes Program Recommendations  AACE/ADA: New Consensus Statement on Inpatient Glycemic Control (2013)  Target Ranges:  Prepandial:   less than 140 mg/dL      Peak postprandial:   less than 180 mg/dL (1-2 hours)      Critically ill patients:  140 - 180 mg/dL    Results for [name], [name] (MRN [id number]) as of 04/08/2013 09:56  Ref. Range 04/07/2013 12:21 04/07/2013 17:36 04/07/2013 20:34  Glucose-Capillary Latest Range: 70-99 mg/dL 409 (H) 811 (H) 914 (H)    Results for [name], [name] (MRN [id number]) as of 04/08/2013 09:56  Ref. Range 04/08/2013 07:54 04/08/2013 09:10  Glucose-Capillary Latest Range: 70-99 mg/dL 60 (L) 86    **Hypoglycemic this morning after receiving Lantus 15 units QHS at bedtime.  **Patient did fairly well on Lantus 10 units QHS.  Note that patient's home dose of Lantus is 7 units bid.  **MD-Please consider decreasing Lantus back to 10 units QHS   Will follow [name] RN, MSN, CDE Diabetes Coordinator Inpatient Diabetes Program Team Pager: [phone] (8a-10p)

## 2013-04-08 NOTE — Progress Notes (Signed)
Subjective:  Cos some  Right leg/avgg discomfort/ tolerated hd yesterday without Cannulation problems Objective Vital signs in last 24 hours: Filed Vitals:   04/07/13 1227 04/07/13 1818 04/07/13 2031 04/08/13 0440  BP: 121/67 134/67 132/66 128/63  Pulse: 71 84 82 72  Temp: 98.1 F (36.7 C) 97.2 F (36.2 C) 98.3 F (36.8 C) 97.9 F (36.6 C)  TempSrc: Oral Oral Oral Oral  Resp: 16 18 18 18   Height:      Weight:   68.201 kg (150 lb 5.7 oz)   SpO2: 100% 100% 100% 99%   Weight change: 7.829 kg (17 lb 4.1 oz)  Intake/Output Summary (Last 24 hours) at 04/08/13 0821 Last data filed at 04/08/13 0443  Gross per 24 hour  Intake    600 ml  Output   4872 ml  Net  -4272 ml   Labs: Basic Metabolic Panel:  Recent Labs Lab 04/02/13 1203 04/06/13 0230 04/07/13 0650  NA 132* 128* 133*  K 4.3 4.8 4.5  CL  --  91* 98  CO2  --  18* 18*  GLUCOSE 378* 681* 170*  BUN  --  51* 64*  CREATININE  --  9.60* 11.38*  CALCIUM  --  7.4* 7.5*    CBC:  Recent Labs Lab 04/02/13 1203 04/06/13 0230 04/06/13 0840  WBC  --  7.2 6.6  NEUTROABS  --  6.2  --   HGB 11.9* 10.8* 9.7*  HCT 35.0* 33.6* 30.4*  MCV  --  104.0* 101.3*  PLT  --  152 152   Cardiac Enzymes: No results found for this basename: CKTOTAL, CKMB, CKMBINDEX, TROPONINI,  in the last 168 hours CBG:  Recent Labs Lab 04/06/13 1635 04/06/13 2108 04/07/13 1221 04/07/13 1736 04/07/13 2034  GLUCAP 117* 374* 160* 263* 105*    Iron Studies: No results found for this basename: IRON, TIBC, TRANSFERRIN, FERRITIN,  in the last 72 hours Studies/Results: [place] Misc Soft Tissue  04/06/2013   CLINICAL DATA:  Right thigh graft pain for several weeks. Assess for infection or abscess.  EXAM: LOWER EXTREMITY LIMITED SOFT TISSUE ULTRASOUND.  TECHNIQUE: Ultrasound examination of the lower extremity soft tissues was performed in the area of clinical concern.  COMPARISON:  None  FINDINGS: A focus of decreased echogenicity along the lateral aspect  of the superior graft is thought to reflect vague fluid in association with soft tissue edema, without definite evidence of a well-defined abscess. There is also additional soft tissue edema adjacent to the graft, along the anterior aspect of the inferior medial graft. Wall plaque is noted along the graft; it remains fully patent.  IMPRESSION: Vague focus of fluid in association with mild soft tissue edema about the patient's right thigh graft, without definite evidence of a well-defined abscess. Underlying infection cannot be entirely excluded, given the patient's symptoms.   Electronically Signed   By: Roanna Raider M.D.   On: 04/06/2013 21:35   Medications:   . amLODipine  5 mg Oral Daily  . calcium acetate  2,001-2,668 mg Oral TID WC  . clonazePAM  1 mg Oral QHS  . docusate sodium  100 mg Oral BID  . feeding supplement (NEPRO CARB STEADY)  237 mL Oral TID WC  . heparin  5,000 Units Subcutaneous Q8H  . insulin aspart  0-15 Units Subcutaneous TID WC  . insulin aspart  3 Units Subcutaneous TID WC  . insulin glargine  15 Units Subcutaneous QHS  . levofloxacin (LEVAQUIN) IV  750 mg Intravenous Once  .  levofloxacin  500 mg Oral Q48H  . metoprolol succinate  100 mg Oral QHS  . multivitamin  1 tablet Oral QHS  . predniSONE  5 mg Oral Q breakfast  . senna  1 tablet Oral Daily  . tacrolimus  1 mg Oral BID  . vancomycin  750 mg Intravenous Q T,Th,Sa-HD    Physical Exam: General: Alert bf nad   Heart: RRR, no rub or murmur  Lungs: Clear bilaterally , no rales, wheezing  Abdomen: BS pos. Normal , soft , nontender Extremities: resolved bipedal edema   Dialysis Access: Pos. Bruit R Fem Avgg . Minimal recent post op induration mildly tender   Dialysis Orders: Center: [facility] on TTS .  EDW 58.5kg HD , 3 hrs Time , Bath 2.o k , 2.5 ca Heparin 0. Access R Fem AVGG BFR 400 DFR auto flow 1.5  Hectorol1.0 mcg mcg IV/HD Epogen 10000 Units IV/HD Venofer 0  Last op labs 02/23/13 = tfs=24 % / ferritin  1750 =12/18/12/ ipth 213 02/23/13   Assessment/Plan  1. DKA = Managment per Admitt Team.  BS now stabilizing/.AS Dr. Marisue Humble noted prednisone taper off slowly. 2.   SP R Fem AVGG Pseudoaneurysm Repair/ resection =continued pain will have [facility] eval ( i called this am) 11/06 surgery/ [place] no abscess, wbc normal/ on vanco/ iv levaquin 2. Recurrent Abdominal pain - per admit / tolerating meals 3. ESRD - HD TTS ( nw) on schedule 4. Transplant Medical Suppression Meds = as noted by Dr. Arlean Hopping and dw Dr. Ruel Favors at [facility] tx center taper off prednisone . DC cellcept and tapering Tac in 1 month 5. Hypertension/volume -128/63 bp , appearing at edw 6. Anemia - hgb 9.7 Aranesp 150 mcg on hd , no venofer 7. Metabolic bone disease - Vit d and binders 8. Nutrition - Carb mod . Renal diet, Renal Vit   Lenny Pastel, PA-C [facility] [phone] 04/08/2013,8:21 AM  LOS: 2 days

## 2013-04-08 NOTE — Progress Notes (Signed)
Hypoglycemic Event  CBG: 45  Treatment: 15 GM carbohydrate snack  Symptoms: Shaky  Follow-up CBG: Time: 11:50 CBG Result: 45  Treatment: 25mL D50 IV  Follow-up CBG: Time: 12:19   CBG Result: 110  Possible Reasons for Event: Inadequate meal intake  Comments/MD notified: MD Laural Benes notified via Amion    [name]  Remember to initiate Hypoglycemia Order Set & complete

## 2013-04-09 DIAGNOSIS — I10 Essential (primary) hypertension: Secondary | ICD-10-CM

## 2013-04-09 DIAGNOSIS — E162 Hypoglycemia, unspecified: Secondary | ICD-10-CM | POA: Diagnosis not present

## 2013-04-09 DIAGNOSIS — E1139 Type 2 diabetes mellitus with other diabetic ophthalmic complication: Secondary | ICD-10-CM

## 2013-04-09 DIAGNOSIS — E11319 Type 2 diabetes mellitus with unspecified diabetic retinopathy without macular edema: Secondary | ICD-10-CM

## 2013-04-09 LAB — CBC
HCT: 33 % — ABNORMAL LOW (ref 36.0–46.0)
MCH: 33.1 pg (ref 26.0–34.0)
MCHC: 31.8 g/dL (ref 30.0–36.0)
MCV: 104.1 fL — ABNORMAL HIGH (ref 78.0–100.0)
Platelets: 158 10*3/uL (ref 150–400)
RDW: 15.4 % (ref 11.5–15.5)
WBC: 6.2 10*3/uL (ref 4.0–10.5)

## 2013-04-09 LAB — RENAL FUNCTION PANEL
Albumin: 3.4 g/dL — ABNORMAL LOW (ref 3.5–5.2)
BUN: 43 mg/dL — ABNORMAL HIGH (ref 6–23)
Calcium: 7.9 mg/dL — ABNORMAL LOW (ref 8.4–10.5)
Chloride: 92 mEq/L — ABNORMAL LOW (ref 96–112)
Creatinine, Ser: 9.43 mg/dL — ABNORMAL HIGH (ref 0.50–1.10)
GFR calc non Af Amer: 5 mL/min — ABNORMAL LOW (ref >90)
Phosphorus: 4.6 mg/dL (ref 2.3–4.6)

## 2013-04-09 LAB — GLUCOSE, CAPILLARY
Glucose-Capillary: 69 mg/dL — ABNORMAL LOW (ref 70–99)
Glucose-Capillary: 34 mg/dL — CL (ref 70–99)
Glucose-Capillary: 37 mg/dL — CL (ref 70–99)
Glucose-Capillary: 147 mg/dL — ABNORMAL HIGH (ref 70–99)

## 2013-04-09 MED ORDER — HYDROXYZINE HCL 25 MG PO TABS
ORAL_TABLET | ORAL | Status: AC
  Administered 2013-04-09: 25 mg
  Filled 2013-04-09: qty 1

## 2013-04-09 MED ORDER — ONDANSETRON HCL 4 MG/2ML IJ SOLN
INTRAMUSCULAR | Status: AC
  Filled 2013-04-09: qty 2

## 2013-04-09 MED ORDER — INSULIN GLARGINE 100 UNIT/ML ~~LOC~~ SOLN
8.0000 [IU] | Freq: Every day | SUBCUTANEOUS | Status: DC
  Administered 2013-04-10: 8 [IU] via SUBCUTANEOUS
  Filled 2013-04-09 (×3): qty 0.08

## 2013-04-09 MED ORDER — INSULIN ASPART 100 UNIT/ML ~~LOC~~ SOLN
0.0000 [IU] | Freq: Three times a day (TID) | SUBCUTANEOUS | Status: DC
  Administered 2013-04-10 – 2013-04-11 (×2): 3 [IU] via SUBCUTANEOUS

## 2013-04-09 MED ORDER — HYDROMORPHONE HCL PF 1 MG/ML IJ SOLN
INTRAMUSCULAR | Status: AC
  Filled 2013-04-09: qty 1

## 2013-04-09 MED ORDER — PENTAFLUOROPROP-TETRAFLUOROETH EX AERO
INHALATION_SPRAY | CUTANEOUS | Status: AC
  Filled 2013-04-09: qty 103.5

## 2013-04-09 MED ORDER — HYDROCODONE-ACETAMINOPHEN 5-325 MG PO TABS
ORAL_TABLET | ORAL | Status: AC
  Filled 2013-04-09: qty 1

## 2013-04-09 MED ORDER — GLUCOSE 40 % PO GEL
ORAL | Status: AC
  Filled 2013-04-09: qty 1

## 2013-04-09 NOTE — Progress Notes (Addendum)
ANTIBIOTIC CONSULT NOTE - FOLLOW UP  Pharmacy Consult:  Vancomycin / Levaquin Indication:  PNA  Allergies  Allergen Reactions  . Depakote [Divalproex Sodium] Other (See Comments)    Pt gets "delirious"  . Morphine And Related Nausea And Vomiting and Other (See Comments)    "makes me delirious"  . Penicillins Anaphylaxis  . Tramadol Nausea And Vomiting  . Vicodin [Hydrocodone-Acetaminophen] Itching and Rash    Patient Measurements: Height: 5\' 1"  (154.9 cm) Weight: 150 lb 5.7 oz (68.201 kg) IBW/kg (Calculated) : 47.8  Vital Signs: Temp: 97.8 F (36.6 C) (11/13 1000) Temp src: Oral (11/13 0356) BP: 116/72 mmHg (11/13 1000) Pulse Rate: 68 (11/13 1000) Intake/Output from previous day: 11/12 0701 - 11/13 0700 In: 1080 [P.O.:1080] Out: 0  Intake/Output from this shift: Total I/O In: 240 [P.O.:240] Out: -   Labs:  Recent Labs  04/07/13 0650  CREATININE 11.38*   Estimated Creatinine Clearance: 5.8 ml/min (by C-G formula based on Cr of 11.38). No results found for this basename: VANCOTROUGH, Leodis Binet, VANCORANDOM, GENTTROUGH, GENTPEAK, GENTRANDOM, TOBRATROUGH, TOBRAPEAK, TOBRARND, AMIKACINPEAK, AMIKACINTROU, AMIKACIN,  in the last 72 hours   Microbiology: Recent Results (from the past 720 hour(s))  MRSA PCR SCREENING     Status: None   Collection Time    03/16/13  9:47 PM      Result Value Range Status   MRSA by PCR NEGATIVE  NEGATIVE Final   Comment:            The GeneXpert MRSA Assay (FDA     approved for NASAL specimens     only), is one component of a     comprehensive MRSA colonization     surveillance program. It is not     intended to diagnose MRSA     infection nor to guide or     monitor treatment for     MRSA infections.  MRSA PCR SCREENING     Status: None   Collection Time    04/06/13  6:51 AM      Result Value Range Status   MRSA by PCR NEGATIVE  NEGATIVE Final   Comment:            The GeneXpert MRSA Assay (FDA     approved for NASAL  specimens     only), is one component of a     comprehensive MRSA colonization     surveillance program. It is not     intended to diagnose MRSA     infection nor to guide or     monitor treatment for     MRSA infections.       Assessment: 38 YOF with a recent history of eye infection on vancomycin PTA.  Vancomycin continued inpatient and levofloxacin added for bronchitis.  Patient has tolerated dialysis and is scheduled for HD today.  Vanc 11/10 >> LVQ 11/10 >>   Goal of Therapy:  Vanc pre-HD level: 15-25 mcg/mL   Plan:  - Continue vanc 750mg  IV q-HD TTS - LVQ 500mg  PO Q48H - F/U clinical course, Vanc level soon     Maurice Fotheringham D. Laney Potash, PharmD, BCPS Pager:  [phone] 04/09/2013, 1:32 PM

## 2013-04-09 NOTE — Progress Notes (Signed)
Inpatient Diabetes Program Recommendations  AACE/ADA: New Consensus Statement on Inpatient Glycemic Control (2013)  Target Ranges:  Prepandial:   less than 140 mg/dL      Peak postprandial:   less than 180 mg/dL (1-2 hours)      Critically ill patients:  140 - 180 mg/dL     Results for [name], [name] (MRN [id number]) as of 04/09/2013 08:55  Ref. Range 04/08/2013 07:54 04/08/2013 09:10 04/08/2013 11:18 04/08/2013 11:50 04/08/2013 12:19 04/08/2013 16:31 04/08/2013 20:18  Glucose-Capillary Latest Range: 70-99 mg/dL 60 (L) 86 45 (L) 45 (L) 110 (H) 190 (H) 96    **Patient with several episodes of hypoglycemia yesterday after receiving 15 units of Lantus the night before.  Noted Lantus decreased to 10 units QHS last night and AM glucose today 69 mg/dl.  Hopeful that patient's CBGs will be improved today since she received a reduced dose of Lantus last night.  **Would continue Novolog meal coverage since patient does tend to have severe hyperglycemia after eating if she does not receive carbohydrate coverage (this is noted from previous admissions).   Will follow. [name] RN, MSN, CDE Diabetes Coordinator Inpatient Diabetes Program Team Pager: [phone] (8a-10p)

## 2013-04-09 NOTE — Progress Notes (Signed)
Subjective:  Eating breakfast leg pain resolving. HD today Objective Vital signs in last 24 hours: Filed Vitals:   04/08/13 1410 04/08/13 1825 04/08/13 2015 04/09/13 0356  BP: 122/55 142/71 131/71 176/76  Pulse: 85 89 70 69  Temp: 97.2 F (36.2 C) 97.7 F (36.5 C) 97.7 F (36.5 C) 97.5 F (36.4 C)  TempSrc: Oral Oral Oral Oral  Resp: 18 18 18 18   Height:      Weight:   68.201 kg (150 lb 5.7 oz)   SpO2: 99% 99% 97% 99%   Weight change: -5.399 kg (-11 lb 14.4 oz)  Intake/Output Summary (Last 24 hours) at 04/09/13 0818 Last data filed at 04/09/13 0356  Gross per 24 hour  Intake    960 ml  Output      0 ml  Net    960 ml   Labs: Basic Metabolic Panel:  Recent Labs Lab 04/02/13 1203 04/06/13 0230 04/07/13 0650  NA 132* 128* 133*  K 4.3 4.8 4.5  CL  --  91* 98  CO2  --  18* 18*  GLUCOSE 378* 681* 170*  BUN  --  51* 64*  CREATININE  --  9.60* 11.38*  CALCIUM  --  7.4* 7.5*    CBC:  Recent Labs Lab 04/02/13 1203 04/06/13 0230 04/06/13 0840  WBC  --  7.2 6.6  NEUTROABS  --  6.2  --   HGB 11.9* 10.8* 9.7*  HCT 35.0* 33.6* 30.4*  MCV  --  104.0* 101.3*  PLT  --  152 152  CBG:  Recent Labs Lab 04/08/13 1150 04/08/13 1219 04/08/13 1631 04/08/13 2018 04/09/13 0722  GLUCAP 45* 110* 190* 96 69*  Studies/Results: No results found. Medications:   . amLODipine  5 mg Oral Daily  . calcium acetate  2,001-2,668 mg Oral TID WC  . clonazePAM  1 mg Oral QHS  . docusate sodium  100 mg Oral BID  . feeding supplement (NEPRO CARB STEADY)  237 mL Oral TID WC  . heparin  5,000 Units Subcutaneous Q8H  . insulin aspart  0-15 Units Subcutaneous TID WC  . insulin aspart  3 Units Subcutaneous TID WC  . insulin glargine  10 Units Subcutaneous QHS  . levofloxacin (LEVAQUIN) IV  750 mg Intravenous Once  . levofloxacin  500 mg Oral Q48H  . metoprolol succinate  100 mg Oral QHS  . multivitamin  1 tablet Oral QHS  . predniSONE  5 mg Oral Q breakfast  . senna  1 tablet Oral  Daily  . tacrolimus  1 mg Oral BID  . vancomycin  750 mg Intravenous Q T,Th,Sa-HD     Physical Exam: General: Alert bf nad  Heart: RRR, no rub or murmur  Lungs: Clear bilaterally , no rales, wheezing  Abdomen: BS pos. Normal , soft , nontender  Extremities: NO bipedal edema  Dialysis Access: Pos. Bruit R Fem Avgg/ palpable pain improving daily to site  Dialysis Orders: Center: [facility] on TTS .  EDW 68.5kg HD , 3 hrs Time , Bath 2.o k , 2.5 ca Heparin 0. Access R Fem AVGG BFR 400 DFR auto flow 1.5  Hectorol1.0 mcg mcg IV/HD Epogen 10000 Units IV/HD Venofer 0  Last op labs 02/23/13 = tfs=24 % / ferritin 1750 =12/18/12/ ipth 213 02/23/13   Assessment/Plan   1. DKA = Managment per Admitt Team. BS now stabilizing/.AS Dr. Marisue Humble noted prednisone taper off slowly 2.   SP R Fem AVGG Pseudoaneurysm Repair/ resection 04/02/13) =Yesterday  seen by Dr. Hart Rochester  VVS , "no        infection noted" / [place] no abscess, wbc normal/ on vanco/ iv levaquin   3.Recurrent Abdominal pain - per admit / tolerating meals 4.ESRD - HD TTS ( nw) on schedule using leg avgg without cannulation problems  5.Transplant Medical Suppression Meds = as noted by Dr. Arlean Hopping and dw Dr. Ruel Favors at [facility] tx center taper off prednisone . DC cellcept and tapering Tac in 1 month 6.  Hypertension/volume -176/76 bp , uf with hd today eating well , lower edw at dc/ amlodipine 5mg  , Metoprolol 100 hs 7.  Anemia - hgb 9.7 Aranesp 150 mcg on hd , no venofer 8.  Metabolic bone disease - Vit d and binders 9.  Nutrition - Carb mod . Renal diet, Renal Vit   Lenny Pastel, PA-C [facility] [phone] 04/09/2013,8:18 AM  LOS: 3 days

## 2013-04-09 NOTE — Progress Notes (Signed)
Patient asymptomatic,CBG=34,refusing glucose gel, amp D50 unavailable,120cc apple juice given per patient request and breakfast tray available/eaten. Rechecked CBG=37. Again asymptomatic,refusing all treatment except juice. 240cc apple juice given. Recheck CBG=84.

## 2013-04-09 NOTE — Progress Notes (Signed)
I saw the patient and agree with the above assessment and plan.    Pt seen by Dr. Hart Rochester yesterday, appreciate assistance.  By report no concern for graft infection.  We discussed the best course for her kidney care which would be tight control of diabetes, adequate RRT, and aggressively pursuing a repeat transplant.    HD Today. LIkely d/c today.

## 2013-04-09 NOTE — Progress Notes (Signed)
[facility] PROGRESS NOTE  [name] WRU:[id number] DOB: [birth year] DOA: 04/06/2013 PCP: [name] Rasmussen, MD  Assessment/Plan: 42 y.o. female with Past medical history of ESRD s/p renal transplant, but HD, recently underwent pseudoaneurysm repair of the fistula site, DM, HTN, HPL, anemia, depression, COPD, chronic intractable abdominal pain, immunosuppression with prednisone, GERD presented with abdominal pain found to be in DKA   1. DKA likely due to steroids; HA 1c-10 (02/2013); resolved DKA;  -Pt having significant hypoglycemia, decrease lantus to 8 units. Hypoglycemia precautions; novolog 3 units with meals, +SSI; titrate as needed; monitor  -per nephrology: Dr.Stratta at [facility] by email and discussed immunosuppression. Plan is for MMF to be stopped now, Tac in 1 mo to be tapered off and and prednisone over next 3-6 month" (patient reports taking steroids for renal transplant, but on HD for the last 6 years?)   2. ESRD s/p renal transplant, but HD; CXR: pulmonary edema; as above; appreciate nephrology team management  -recent underwent pseudoaneurysm repair of the fistula site; [place]: soft tissue edema, no abscess; cont monitor  - Post op s/p right AVG aneurysm repair, with reported persistent pain, vascular surgeon called to evaluate, control pain, no signs of infection, appears to be healing well  3. Anemia AoCD; no s/s of acute bleeding; cont monitoring  4. HTN - better controlled now.   resume home meds/ Add amlodipine  5. Chronic pain, cont pain control;  6. COPD/Bronchitis; symptoms improving, cont bronchodilators, oxygen, abx   Code Status: full  Family Communication: none at the bedside   HPI/Subjective: Pt reports that pain in leg is getting better  Objective: Filed Vitals:   04/09/13 1403  BP: 129/71  Pulse: 69  Temp: 97.4 F (36.3 C)  Resp: 18    Intake/Output Summary (Last 24 hours) at 04/09/13 1514 Last data filed at 04/09/13 1000  Gross per 24  hour  Intake    960 ml  Output      0 ml  Net    960 ml   Filed Weights   04/07/13 1115 04/07/13 2031 04/08/13 2015  Weight: 150 lb 5.7 oz (68.2 kg) 150 lb 5.7 oz (68.201 kg) 150 lb 5.7 oz (68.201 kg)   Exam:  General: Alert  Cardiovascular: s1,s2 rrr  Respiratory: bBS clear  Abdomen: soft, nt , nd  Musculoskeletal: Right AVG site tender, no signs of erythema or drainage.    Data Reviewed: Basic Metabolic Panel:  Recent Labs Lab 04/06/13 0230 04/07/13 0650  NA 128* 133*  K 4.8 4.5  CL 91* 98  CO2 18* 18*  GLUCOSE 681* 170*  BUN 51* 64*  CREATININE 9.60* 11.38*  CALCIUM 7.4* 7.5*   Liver Function Tests: No results found for this basename: AST, ALT, ALKPHOS, BILITOT, PROT, ALBUMIN,  in the last 168 hours No results found for this basename: LIPASE, AMYLASE,  in the last 168 hours No results found for this basename: AMMONIA,  in the last 168 hours CBC:  Recent Labs Lab 04/06/13 0230 04/06/13 0840  WBC 7.2 6.6  NEUTROABS 6.2  --   HGB 10.8* 9.7*  HCT 33.6* 30.4*  MCV 104.0* 101.3*  PLT 152 152   Cardiac Enzymes: No results found for this basename: CKTOTAL, CKMB, CKMBINDEX, TROPONINI,  in the last 168 hours BNP (last 3 results) No results found for this basename: PROBNP,  in the last 8760 hours CBG:  Recent Labs Lab 04/08/13 2018 04/09/13 0722 04/09/13 0947 04/09/13 1036 04/09/13 1104  GLUCAP 96 69*  34* 37* 84    Recent Results (from the past 240 hour(s))  MRSA PCR SCREENING     Status: None   Collection Time    04/06/13  6:51 AM      Result Value Range Status   MRSA by PCR NEGATIVE  NEGATIVE Final   Comment:            The GeneXpert MRSA Assay (FDA     approved for NASAL specimens     only), is one component of a     comprehensive MRSA colonization     surveillance program. It is not     intended to diagnose MRSA     infection nor to guide or     monitor treatment for     MRSA infections.     Studies: No results found.  Scheduled  Meds: . amLODipine  5 mg Oral Daily  . calcium acetate  2,001-2,668 mg Oral TID WC  . clonazePAM  1 mg Oral QHS  . docusate sodium  100 mg Oral BID  . feeding supplement (NEPRO CARB STEADY)  237 mL Oral TID WC  . heparin  5,000 Units Subcutaneous Q8H  . insulin aspart  0-9 Units Subcutaneous TID WC  . insulin aspart  3 Units Subcutaneous TID WC  . insulin glargine  8 Units Subcutaneous QHS  . levofloxacin (LEVAQUIN) IV  750 mg Intravenous Once  . levofloxacin  500 mg Oral Q48H  . metoprolol succinate  100 mg Oral QHS  . multivitamin  1 tablet Oral QHS  . predniSONE  5 mg Oral Q breakfast  . senna  1 tablet Oral Daily  . tacrolimus  1 mg Oral BID  . vancomycin  750 mg Intravenous Q T,Th,Sa-HD   Continuous Infusions:   Principal Problem:   DKA (diabetic ketoacidoses) Active Problems:   ESRD (end stage renal disease)   DM (diabetes mellitus), type 1 with renal complications   Acute bronchitis   Protein-calorie malnutrition, severe   [name] [phone]. If 7PM-7AM, please contact night-coverage at [email or web address], password [email or web address] 04/09/2013, 3:14 PM  LOS: 3 days

## 2013-04-10 DIAGNOSIS — E162 Hypoglycemia, unspecified: Secondary | ICD-10-CM

## 2013-04-10 LAB — GLUCOSE, CAPILLARY
Glucose-Capillary: 230 mg/dL — ABNORMAL HIGH (ref 70–99)
Glucose-Capillary: 22 mg/dL — CL (ref 70–99)
Glucose-Capillary: 45 mg/dL — ABNORMAL LOW (ref 70–99)
Glucose-Capillary: 125 mg/dL — ABNORMAL HIGH (ref 70–99)
Glucose-Capillary: 138 mg/dL — ABNORMAL HIGH (ref 70–99)

## 2013-04-10 MED ORDER — DARBEPOETIN ALFA-POLYSORBATE 100 MCG/0.5ML IJ SOLN
100.0000 ug | INTRAMUSCULAR | Status: DC
  Administered 2013-04-11: 100 ug via INTRAVENOUS
  Filled 2013-04-10: qty 0.5

## 2013-04-10 MED ORDER — INSULIN GLARGINE 100 UNIT/ML ~~LOC~~ SOLN
4.0000 [IU] | Freq: Every day | SUBCUTANEOUS | Status: DC
  Administered 2013-04-10: 4 [IU] via SUBCUTANEOUS
  Filled 2013-04-10 (×2): qty 0.04

## 2013-04-10 MED ORDER — HYDROMORPHONE HCL PF 1 MG/ML IJ SOLN
0.5000 mg | INTRAMUSCULAR | Status: DC | PRN
  Administered 2013-04-10 – 2013-04-11 (×6): 0.5 mg via INTRAVENOUS
  Filled 2013-04-10 (×5): qty 1

## 2013-04-10 MED ORDER — INSULIN ASPART 100 UNIT/ML ~~LOC~~ SOLN
3.0000 [IU] | Freq: Three times a day (TID) | SUBCUTANEOUS | Status: DC
  Administered 2013-04-11 (×2): 3 [IU] via SUBCUTANEOUS

## 2013-04-10 MED ORDER — DEXTROSE 10 % IV SOLN
INTRAVENOUS | Status: AC
  Administered 2013-04-10: 15:00:00 via INTRAVENOUS

## 2013-04-10 MED ORDER — DEXTROSE 50 % IV SOLN
INTRAVENOUS | Status: AC
  Administered 2013-04-10: 25 mL
  Filled 2013-04-10: qty 50

## 2013-04-10 NOTE — Progress Notes (Signed)
Pt is still having low BS despite eating meal and other interventions.  Will start D10 infusion and continue to monitor closely BS every 15 mins until stable.   Maryln Manuel, MD

## 2013-04-10 NOTE — Progress Notes (Signed)
CBG: 28 Treatment: D50 IV 25 mL  Symptoms: Sweaty  Follow-up CBG: Time:1220 CBG Result:45  Possible Reasons for Event: Inadequate meal intake  Comments/MD notified:Johnson, MD

## 2013-04-10 NOTE — Progress Notes (Signed)
[facility] PROGRESS NOTE  TILLEY FAETH WUJ:[id number] DOB: 03-19-1971 DOA: 04/06/2013 PCP: [name], MD  Assessment/Plan: 42 y.o. female with Past medical history of ESRD s/p renal transplant, but HD, recently underwent pseudoaneurysm repair of the fistula site, DM, HTN, HPL, anemia, depression, COPD, chronic intractable abdominal pain, immunosuppression with prednisone, GERD presented with abdominal pain found to be in DKA   1. DKA likely due to steroids; HA 1c-10 (02/2013); resolved DKA;  -Pt having significant hypoglycemia, her diabetes is very brittle.  She did not eat any breakfast this morning and had her mealtime novolog.  continue lantus to 8 units. Hypoglycemia precautions; novolog 3 units with meals, +SSI; titrate as needed; monitor Will schedule mealtime novolog to be given after meal only.   -per nephrology: Dr.Stratta at [facility] by email and discussed immunosuppression. Plan is for MMF to be stopped now, Tac in 1 mo to be tapered off and and prednisone over next 3-6 month" (patient reports taking steroids for renal transplant, but on HD for the last 6 years?)   2. ESRD s/p renal transplant, but HD; CXR: pulmonary edema; as above; appreciate nephrology team management  -recent underwent pseudoaneurysm repair of the fistula site; [place]: soft tissue edema, no abscess; cont monitor  - Post op s/p right AVG aneurysm repair, with reported persistent pain, vascular surgeon called to evaluate, control pain, no signs of infection, appears to be healing well   3. Anemia AoCD; no s/s of acute bleeding; cont monitoring   4. HTN - better controlled now. resume home meds/ Added amlodipine   5. Chronic pain, cont pain control; decrease pain meds now, pt no longer complaining about leg  6. COPD/Bronchitis; symptoms improving, cont bronchodilators, oxygen, abx   Family Communication: none at the bedside    HPI/Subjective: came in room when blood glucose was being tested  and it was 23, repeated and 22, Pt did not eat any of her breakfast today and had her mealtime novolog, says that she didn't like what was on her tray this morning and didn't eat anything, she was asymptomatic, talking on the telephone, speaking clearly  Objective: Filed Vitals:   04/10/13 1018  BP: 145/67  Pulse: 72  Temp: 98.5 F (36.9 C)  Resp: 18    Intake/Output Summary (Last 24 hours) at 04/10/13 1144 Last data filed at 04/10/13 1018  Gross per 24 hour  Intake    480 ml  Output   3000 ml  Net  -2520 ml   Filed Weights   04/09/13 1515 04/09/13 1936 04/09/13 2007  Weight: 159 lb 13.3 oz (72.5 kg) 153 lb 7 oz (69.6 kg) 159 lb 13.3 oz (72.499 kg)    Exam:   General:  Awake, alert, no apparent distress  Cardiovascular: normal s1, s2 sounds   Respiratory: BBS clear   Abdomen: soft, nondistended, nontender   Data Reviewed: Basic Metabolic Panel:  Recent Labs Lab 04/06/13 0230 04/07/13 0650 04/09/13 1606  NA 128* 133* 132*  K 4.8 4.5 5.4*  CL 91* 98 92*  CO2 18* 18* 24  GLUCOSE 681* 170* 223*  BUN 51* 64* 43*  CREATININE 9.60* 11.38* 9.43*  CALCIUM 7.4* 7.5* 7.9*  PHOS  --   --  4.6   Liver Function Tests:  Recent Labs Lab 04/09/13 1606  ALBUMIN 3.4*   No results found for this basename: LIPASE, AMYLASE,  in the last 168 hours No results found for this basename: AMMONIA,  in the last 168 hours CBC:  Recent Labs Lab 04/06/13 0230 04/06/13 0840 04/09/13 1606  WBC 7.2 6.6 6.2  NEUTROABS 6.2  --   --   HGB 10.8* 9.7* 10.5*  HCT 33.6* 30.4* 33.0*  MCV 104.0* 101.3* 104.1*  PLT 152 152 158   Cardiac Enzymes: No results found for this basename: CKTOTAL, CKMB, CKMBINDEX, TROPONINI,  in the last 168 hours BNP (last 3 results) No results found for this basename: PROBNP,  in the last 8760 hours CBG:  Recent Labs Lab 04/09/13 1104 04/09/13 2006 04/10/13 0725 04/10/13 1133 04/10/13 1134  GLUCAP 84 147* 230* 23* 22*    Recent Results (from  the past 240 hour(s))  MRSA PCR SCREENING     Status: None   Collection Time    04/06/13  6:51 AM      Result Value Range Status   MRSA by PCR NEGATIVE  NEGATIVE Final   Comment:            The GeneXpert MRSA Assay (FDA     approved for NASAL specimens     only), is one component of a     comprehensive MRSA colonization     surveillance program. It is not     intended to diagnose MRSA     infection nor to guide or     monitor treatment for     MRSA infections.     Studies: No results found.  Scheduled Meds: . amLODipine  5 mg Oral Daily  . calcium acetate  2,001-2,668 mg Oral TID WC  . clonazePAM  1 mg Oral QHS  . [START ON 04/11/2013] darbepoetin (ARANESP) injection - DIALYSIS  100 mcg Intravenous Q Sat-HD  . dextrose      . docusate sodium  100 mg Oral BID  . feeding supplement (NEPRO CARB STEADY)  237 mL Oral TID WC  . heparin  5,000 Units Subcutaneous Q8H  . insulin aspart  0-9 Units Subcutaneous TID WC  . insulin aspart  3 Units Subcutaneous TID WC  . insulin glargine  8 Units Subcutaneous QHS  . levofloxacin (LEVAQUIN) IV  750 mg Intravenous Once  . levofloxacin  500 mg Oral Q48H  . metoprolol succinate  100 mg Oral QHS  . multivitamin  1 tablet Oral QHS  . predniSONE  5 mg Oral Q breakfast  . senna  1 tablet Oral Daily  . tacrolimus  1 mg Oral BID  . vancomycin  750 mg Intravenous Q T,Th,Sa-HD   Continuous Infusions:   Principal Problem:   DKA (diabetic ketoacidoses) Active Problems:   ESRD (end stage renal disease)   DM (diabetes mellitus), type 1 with renal complications   Acute bronchitis   Protein-calorie malnutrition, severe   Hypoglycemia   Shealyn [employer] [phone]. If 7PM-7AM, please contact night-coverage at [email or web address], password [facility] 04/10/2013, 11:44 AM  LOS: 4 days   e

## 2013-04-10 NOTE — Progress Notes (Signed)
I saw the patient and agree with the above assessment and plan.    Asymptomatic hypoglycemia over course of today.  Pt's HD / renal issues are stable.  Immunosuppression addressed and access is stable.  HD tomorrow if inhouse  RS

## 2013-04-10 NOTE — Progress Notes (Signed)
[facility] Progress Note  Subjective:   Minimal cooperation   Objective Filed Vitals:   04/09/13 1936 04/09/13 2007 04/09/13 2256 04/10/13 0500  BP: 136/79 170/77 165/92 146/65  Pulse: 84 85 87 76  Temp: 98.2 F (36.8 C) 98.9 F (37.2 C)  98.5 F (36.9 C)  TempSrc: Oral Oral  Oral  Resp: 16 16  16   Height:      Weight: 69.6 kg (153 lb 7 oz) 72.499 kg (159 lb 13.3 oz)    SpO2: 92% 99%  98%   Physical Exam General: Asleep. NAD Heart: RRR, no murmur or rub Lungs: CTA bilat, no appreciable wheezes or rhonchi Abdomen: Soft, NT, + BS Extremities: No LE edema Dialysis Access: R fem AVG - refused to reposition for exam  Dialysis Orders: Center: [facility] on TTS .  EDW 68.5kg HD , 3 hrs Time , Bath 2.o k , 2.5 ca Heparin 0. Access R Fem AVGG BFR 400 DFR auto flow 1.5  Hectorol1.0 mcg mcg IV/HD Epogen 10000 Units IV/HD Venofer 0  Last op labs 02/23/13 = tfs=24 % / ferritin 1750 =12/18/12/ ipth 213 02/23/13    Assessment/Plan 1. DKA - Managment per Admitt Team. BS now stabilizing/ As Dr. Marisue Humble noted prednisone taper off slowly 2. SP R Fem AVGG Pseudoaneurysm Repair/ resection 04/02/13. Seen by Dr. Hart Rochester V[facility] , "no infection noted" / [place] no abscess, wbc normal/ on vanco/ iv levaquin  3. Recurrent Abdominal pain - per admit / tolerating meals  4. ESRD - HD TTS - K+ 5.4. HD tomorrow  5. Transplant Medical Suppression Meds - as noted by Dr. Arlean Hopping and dw Dr. Ruel Favors at [facility] tx center taper off prednisone . DC cellcept and tapering Tac in 1 month 6. Hypertension/volume - SBPs 140s on amlodipine and Toprol. ? Accuracy of 11/13 post wgts. Standing wgts tomorrow. Lower edw at  D/c 7. Anemia - Hgb 10.5 > 9.7. Aranesp 100  q Sat. No Fe  8. Metabolic bone disease - Corrected Ca 8.4. P controlled. Continue Vit d, binders and 2.5 Ca bath 9. Nutrition - Carb mod . Renal diet, Renal Vit  Scot Jun. Thad Ranger [facility] Pager [phone] 04/10/2013,8:45 AM  LOS: 4  days    Additional Objective Labs: Basic Metabolic Panel:  Recent Labs Lab 04/06/13 0230 04/07/13 0650 04/09/13 1606  NA 128* 133* 132*  K 4.8 4.5 5.4*  CL 91* 98 92*  CO2 18* 18* 24  GLUCOSE 681* 170* 223*  BUN 51* 64* 43*  CREATININE 9.60* 11.38* 9.43*  CALCIUM 7.4* 7.5* 7.9*  PHOS  --   --  4.6   Liver Function Tests:  Recent Labs Lab 04/09/13 1606  ALBUMIN 3.4*   CBC:  Recent Labs Lab 04/06/13 0230 04/06/13 0840 04/09/13 1606  WBC 7.2 6.6 6.2  NEUTROABS 6.2  --   --   HGB 10.8* 9.7* 10.5*  HCT 33.6* 30.4* 33.0*  MCV 104.0* 101.3* 104.1*  PLT 152 152 158   Blood Culture    Component Value Date/Time   SDES BLOOD RIGHT HAND 02/17/2013 0708   SPECREQUEST BOTTLES DRAWN AEROBIC AND ANAEROBIC 10CC 02/17/2013 0708   CULT  Value: NO GROWTH 5 DAYS Performed at [facility] 02/17/2013 0708   REPTSTATUS 02/23/2013 FINAL 02/17/2013 0708    CBG:  Recent Labs Lab 04/09/13 0947 04/09/13 1036 04/09/13 1104 04/09/13 2006 04/10/13 0725  GLUCAP 34* 37* 84 147* 230*   Studies/Results: No results found. Medications:   . amLODipine  5 mg Oral Daily  . calcium acetate  2,001-2,668 mg Oral TID WC  . clonazePAM  1 mg Oral QHS  . docusate sodium  100 mg Oral BID  . feeding supplement (NEPRO CARB STEADY)  237 mL Oral TID WC  . heparin  5,000 Units Subcutaneous Q8H  . insulin aspart  0-9 Units Subcutaneous TID WC  . insulin aspart  3 Units Subcutaneous TID WC  . insulin glargine  8 Units Subcutaneous QHS  . levofloxacin (LEVAQUIN) IV  750 mg Intravenous Once  . levofloxacin  500 mg Oral Q48H  . metoprolol succinate  100 mg Oral QHS  . multivitamin  1 tablet Oral QHS  . predniSONE  5 mg Oral Q breakfast  . senna  1 tablet Oral Daily  . tacrolimus  1 mg Oral BID  . vancomycin  750 mg Intravenous Q T,Th,Sa-HD

## 2013-04-10 NOTE — Progress Notes (Signed)
CBG: 22  Treatment: 15 GM carbohydrate snack   Symptoms: None  Follow-up CBG: Time12 CBG Result:28  Possible Reasons for Event: Inadequate meal intake  Comments/MD notified:MD [employer]

## 2013-04-11 DIAGNOSIS — N058 Unspecified nephritic syndrome with other morphologic changes: Secondary | ICD-10-CM

## 2013-04-11 DIAGNOSIS — E1029 Type 1 diabetes mellitus with other diabetic kidney complication: Secondary | ICD-10-CM

## 2013-04-11 LAB — GLUCOSE, CAPILLARY
Glucose-Capillary: 163 mg/dL — ABNORMAL HIGH (ref 70–99)
Glucose-Capillary: 227 mg/dL — ABNORMAL HIGH (ref 70–99)
Glucose-Capillary: 74 mg/dL (ref 70–99)

## 2013-04-11 LAB — CBC
HCT: 30.8 % — ABNORMAL LOW (ref 36.0–46.0)
Hemoglobin: 10.1 g/dL — ABNORMAL LOW (ref 12.0–15.0)
MCHC: 32.8 g/dL (ref 30.0–36.0)
MCV: 102.3 fL — ABNORMAL HIGH (ref 78.0–100.0)
RDW: 15 % (ref 11.5–15.5)

## 2013-04-11 LAB — BASIC METABOLIC PANEL
BUN: 51 mg/dL — ABNORMAL HIGH (ref 6–23)
CO2: 24 mEq/L (ref 19–32)
GFR calc non Af Amer: 6 mL/min — ABNORMAL LOW (ref >90)
Glucose, Bld: 250 mg/dL — ABNORMAL HIGH (ref 70–99)
Potassium: 4.9 mEq/L (ref 3.5–5.1)

## 2013-04-11 MED ORDER — ACCU-CHEK SOFT TOUCH LANCETS MISC: Status: DC

## 2013-04-11 MED ORDER — HYDROMORPHONE HCL PF 1 MG/ML IJ SOLN
INTRAMUSCULAR | Status: AC
  Administered 2013-04-11: 0.5 mg via INTRAVENOUS
  Filled 2013-04-11: qty 1

## 2013-04-11 MED ORDER — GLUCOSE BLOOD VI STRP: ORAL_STRIP | Status: DC

## 2013-04-11 MED ORDER — HYDROXYZINE HCL 25 MG PO TABS
ORAL_TABLET | ORAL | Status: AC
  Filled 2013-04-11: qty 1

## 2013-04-11 MED ORDER — INSULIN GLARGINE 100 UNIT/ML ~~LOC~~ SOLN: 5.0000 [IU] | Freq: Every day | SUBCUTANEOUS | Status: DC

## 2013-04-11 MED ORDER — DARBEPOETIN ALFA-POLYSORBATE 100 MCG/0.5ML IJ SOLN
INTRAMUSCULAR | Status: AC
  Administered 2013-04-11: 100 ug via INTRAVENOUS
  Filled 2013-04-11: qty 0.5

## 2013-04-11 MED ORDER — ONDANSETRON HCL 4 MG/2ML IJ SOLN
INTRAMUSCULAR | Status: AC
  Administered 2013-04-11: 4 mg via INTRAVENOUS
  Filled 2013-04-11: qty 2

## 2013-04-11 NOTE — Procedures (Signed)
I was present at this dialysis session. I have reviewed the session itself and made appropriate changes.   AVG easily accessed.  Goal UF 3L.  Pt with ongoing discomfort in leg.  On IV dialudid.  Leg has been thoroughly evaluated.    Sabra Heck  MD 04/11/2013, 9:14 AM

## 2013-04-11 NOTE — Discharge Summary (Signed)
Physician Discharge Summary  [name] JYN:[id number] DOB: [birth year] DOA: 04/06/2013  PCP: [name], MD  Admit date: 04/06/2013 Discharge date: 04/11/2013  Recommendations for Outpatient Follow-up:  1. Monitor Diabetes and adjust meds as needed 2. Resume vancomycin during HD sessions  3. Follow up with GI and vascular surgery as scheduled 4. Follow up with nephrology   Discharge Diagnoses:  Principal Problem:   DKA (diabetic ketoacidoses) Active Problems:   ESRD (end stage renal disease)   DM (diabetes mellitus), type 1 with renal complications   Acute bronchitis   Protein-calorie malnutrition, severe   Hypoglycemia  Discharge Condition: stable  Diet recommendation: renal, carb modified, heart healthy  Filed Weights   04/09/13 1936 04/09/13 2007 04/11/13 0848  Weight: 153 lb 7 oz (69.6 kg) 159 lb 13.3 oz (72.499 kg) 156 lb 12 oz (71.1 kg)    History of present illness:  [name] is a 42 y.o. female with Past medical history of ESRD on hemodialysis, recently underwent pseudoaneurysm repair of the fistula site, diabetes mellitus, hypertension, chronic intractable abdominal pain, immunosuppression with prednisone, GERD.  The patient is coming from home.  The patient presented with complaints of headache and right eye pain that has been ongoing since Thursday. She also complained of abdominal pain which has been ongoing since today.   The patient mentions that she had infection in her right eye and has been getting antibiotics with dialysis vancomycin. As per the recent documentation she has undergone pseudoaneurysm repair on 11/6 and might have been given prophylactic antibiotic for that particular procedure, the procedure not mentions there was no infection of the graft site.  She has intractable abdominal pain for which she was recommended to followup with GI during her last admission and today she mentions that the pain is recurring again located  diffusely in the abdomen and sharp in nature not associated with food, she also complains of nausea and one episode of vomiting before arrival to the ED. She denied any fever but does mention that she has cough which is new for her and has shortness of breath.  Hospital Course:  42 y.o. female with Past medical history of ESRD s/p renal transplant, but HD, recently underwent pseudoaneurysm repair of the fistula site, DM, HTN, HPL, anemia, depression, COPD, chronic intractable abdominal pain, immunosuppression with prednisone, GERD presented with abdominal pain found to be in DKA   1. DKA likely due to steroids; HA 1c-10 (02/2013); resolved DKA;  -Pt had significant hypoglycemia, but recently has resolved as she had not been eating her meals. Her diabetes is very brittle.  Hypoglycemia precautions; novolog 3 units after meals, +SSI; mealtime novolog to be given after meal only if she eats at least 50% of meal.  -per nephrology: Dr.Stratta at [facility] by email and discussed immunosuppression. Plan is for MMF to be stopped now, Tac in 1 mo to be tapered off and and prednisone over next 3-6 month" (patient reports taking steroids for renal transplant, but on HD for the last 6 years?)   2. ESRD s/p renal transplant, but HD; CXR: pulmonary edema; as above; appreciate nephrology team management  -recent underwent pseudoaneurysm repair of the fistula site; [place]: soft tissue edema, no abscess; cont monitor  - Post op s/p right AVG aneurysm repair, with reported persistent pain, vascular surgeon called to evaluate, no signs of infection, appears to be healing well   3. Anemia AoCD; no s/s of acute bleeding; cont monitoring   4. HTN - better  controlled now. resume home meds  5. Chronic pain, cont pain control;  pt no longer complaining about leg   6. COPD/Bronchitis; symptoms improving, cont bronchodilators, oxygen, abx   Procedures:  hemodialysis  Consultations:  nephrology  Vascular surgery  Discharge  Exam: Pt was seen in HD today, No complaints. Hypoglycemia has resolved. No cough, no CP, no SOB  Filed Vitals:   04/11/13 1256  BP: 128/70  Pulse: 75  Temp: 98 F (36.7 C)  Resp: 16   General: awake, alert, no distress, cooperative Cardiovascular: normal s1, s2 sounds  Respiratory: BBS clear to auscultation  Discharge Instructions    Medication List    STOP taking these medications       mycophenolate 250 MG capsule  Commonly known as:  CELLCEPT      TAKE these medications       albuterol 108 (90 BASE) MCG/ACT inhaler  Commonly known as:  PROVENTIL HFA;VENTOLIN HFA  Inhale 2 puffs into the lungs every 4 (four) hours as needed for wheezing or shortness of breath (as per home regimen).     calcium acetate 667 MG capsule  Commonly known as:  PHOSLO  Take 2,001-2,668 mg by mouth 3 (three) times daily with meals. Dosage depends on meal consumed     clonazePAM 0.5 MG tablet  Commonly known as:  KLONOPIN  Take 1 mg by mouth at bedtime.     EXFORGE 10-160 MG per tablet  Generic drug:  amLODipine-valsartan  Take 1 tablet by mouth at bedtime.     feeding supplement (NEPRO CARB STEADY) Liqd  Take 237 mLs by mouth daily.     gabapentin 100 MG capsule  Commonly known as:  NEURONTIN  Take 100 mg by mouth daily.     HYDROcodone-acetaminophen 5-325 MG per tablet  Commonly known as:  NORCO  Take 1 tablet by mouth every 6 (six) hours as needed.     insulin glargine 100 UNIT/ML injection  Commonly known as:  LANTUS  Inject 0.05 mLs (5 Units total) into the skin daily.     metoprolol succinate 100 MG 24 hr tablet  Commonly known as:  TOPROL-XL  Take 100 mg by mouth at bedtime. Take with or immediately following a meal.     multivitamin Tabs tablet  Take 1 tablet by mouth daily.     pantoprazole 40 MG tablet  Commonly known as:  PROTONIX  Take 1 tablet (40 mg total) by mouth at bedtime.     predniSONE 5 MG tablet  Commonly known as:  DELTASONE  Take 5 mg by mouth  daily.     tacrolimus 1 MG capsule  Commonly known as:  PROGRAF  Take 1 mg by mouth 2 (two) times daily.       Allergies  Allergen Reactions  . Depakote [Divalproex Sodium] Other (See Comments)    Pt gets "delirious"  . Morphine And Related Nausea And Vomiting and Other (See Comments)    "makes me delirious"  . Penicillins Anaphylaxis  . Tramadol Nausea And Vomiting  . Vicodin [Hydrocodone-Acetaminophen] Itching and Rash       Follow-up Information   Follow up with [name], [name], MD. Schedule an appointment as soon as possible for a visit in 1 week.   Specialty:  Family Medicine   Contact information:   [address] [place] [address] [phone]      The results of significant diagnostics from this hospitalization (including imaging, microbiology, ancillary and laboratory) are  listed below for reference.    Significant Diagnostic Studies: Dg Chest 2 View  03/21/2013   CLINICAL DATA:  Fever. Short of breath. Chest pain. Abdominal pain. Cough. Congestion.  EXAM: CHEST  2 VIEW  COMPARISON:  03/16/2013.  FINDINGS: Increasing bilateral interstitial and airspace opacity compared to the prior exam. The cardiopericardial silhouette is mildly enlarged for projection. Mediastinal contours appear within normal limits. Thickening of the fissures is present on the lateral view. The overall constellation of findings is most compatible with CHF/volume overload with interstitial and alveolar pulmonary edema. Superimposed pneumonia is difficult to exclude but not favored. There is no gross pleural fusion. Cholecystectomy clips are present in the right upper quadrant.  IMPRESSION: Constellation of findings most compatible with moderate CHF/volume overload.   Electronically Signed   By: Andreas Newport M.D.   On: 03/21/2013 18:50   Dg Chest 2 View  03/16/2013   CLINICAL DATA:  Shortness of breath  EXAM: CHEST  2 VIEW  COMPARISON:  02/19/2013  FINDINGS: The cardio  pericardial silhouette is enlarged. There is right base atelectasis or infiltrate. Small right pleural effusion associated. Imaged bony structures of the thorax are intact.  IMPRESSION: Right base atelectasis or pneumonia with small right pleural effusion.   Electronically Signed   By: Kennith Center M.D.   On: 03/16/2013 17:34   Ct Head Wo Contrast  03/23/2013   CLINICAL DATA:  Lethargy.  EXAM: CT HEAD WITHOUT CONTRAST  TECHNIQUE: Contiguous axial images were obtained from the base of the skull through the vertex without intravenous contrast.  COMPARISON:  01/04/2013  FINDINGS: No mass lesion. No midline shift. No acute hemorrhage or hematoma. No extra-axial fluid collections. No evidence of acute infarction. There benign calcifications in the basal ganglia and cerebellar hemispheres, stable. Tiny old lacunar infarct in left basal ganglia, unchanged. No osseous abnormality.  IMPRESSION: No acute abnormalities.   Electronically Signed   By: Geanie Cooley M.D.   On: 03/23/2013 15:11   Ct Abdomen Pelvis W Contrast  03/16/2013   CLINICAL DATA:  Diffuse stabbing abdominal pain with nausea and vomiting for 2 days, chronic dialysis  EXAM: CT ABDOMEN AND PELVIS WITH CONTRAST  TECHNIQUE: Multidetector CT imaging of the abdomen and pelvis was performed using the standard protocol following bolus administration of intravenous contrast.  CONTRAST:  OMNIPAQUE IOHEXOL 300 MG/ML  SOLN  COMPARISON:  06/10/2012  FINDINGS: There is mild scattered bilateral hazy ground-glass attenuation with more focal dependent atelectasis bilaterally. There is a tiny left and small to moderate right pleural effusion.  There are no focal hepatic abnormalities. There is mild periportal edema. The spleen, pancreas, and adrenal glands are normal. There is severe bilateral renal atrophy and cystic change. 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 There is a nonobstructive bowel gas pattern with mild  diffuse colon wall thickening. There is also mild infiltration of the omentum and inflammatory change in the adjacent fat. These findings are similar to the prior study. The appendix is normal. Reproductive organs and bladder are normal. There is a small volume of ascites. Small transplanted kidney left flank unchanged. There are no acute musculoskeletal findings.  IMPRESSION: Diffuse colon wall thickening with mild infiltration of the omentum. The findings again suggest a diffuse colitis.   Electronically Signed   By: Esperanza Heir M.D.   On: 03/16/2013 17:45   Nm Pulmonary Perf And Vent  03/16/2013   CLINICAL DATA:  Shortness of breath  EXAM: NUCLEAR MEDICINE VENTILATION -  PERFUSION LUNG SCAN  TECHNIQUE: Ventilation images were obtained in multiple projections using inhaled aerosol technetium 99 M DTPA. Perfusion images were obtained in multiple projections after intravenous injection of Tc-29m MAA.  COMPARISON:  02/27/2011.  RADIOPHARMACEUTICALS:  40 mCi Tc-65m DTPA aerosol and 6 mCi Tc-40m MAA  FINDINGS: Ventilation: No focal ventilation defect.  Perfusion: No wedge shaped peripheral perfusion defects to suggest acute pulmonary embolism.  IMPRESSION: Very low probability for pulmonary embolus. .   Electronically Signed   By: Kennith Center M.D.   On: 03/16/2013 17:21   [place] Misc Soft Tissue  04/06/2013   CLINICAL DATA:  Right thigh graft pain for several weeks. Assess for infection or abscess.  EXAM: LOWER EXTREMITY LIMITED SOFT TISSUE ULTRASOUND.  TECHNIQUE: Ultrasound examination of the lower extremity soft tissues was performed in the area of clinical concern.  COMPARISON:  None  FINDINGS: A focus of decreased echogenicity along the lateral aspect of the superior graft is thought to reflect vague fluid in association with soft tissue edema, without definite evidence of a well-defined abscess. There is also additional soft tissue edema adjacent to the graft, along the anterior aspect of the inferior  medial graft. Wall plaque is noted along the graft; it remains fully patent.  IMPRESSION: Vague focus of fluid in association with mild soft tissue edema about the patient's right thigh graft, without definite evidence of a well-defined abscess. Underlying infection cannot be entirely excluded, given the patient's symptoms.   Electronically Signed   By: Roanna Raider M.D.   On: 04/06/2013 21:35   Dg Chest Port 1 View  04/06/2013   CLINICAL DATA:  Cough and shortness of breath.  EXAM: PORTABLE CHEST - 1 VIEW  COMPARISON:  Chest radiograph performed 03/27/2013  FINDINGS: The lungs are well-aerated. Vascular congestion is noted, with increased interstitial markings, concerning for mild pulmonary edema. There is no evidence of pleural effusion or pneumothorax.  The cardiomediastinal silhouette is mildly enlarged. No acute osseous abnormalities are seen.  IMPRESSION: Vascular congestion and mild cardiomegaly, with increased interstitial markings, concerning for mild pulmonary edema.   Electronically Signed   By: Roanna Raider M.D.   On: 04/06/2013 05:13   Dg Chest Port 1 View  03/27/2013   CLINICAL DATA:  Right-sided chest pain and cough  EXAM: PORTABLE CHEST - 1 VIEW  COMPARISON:  03/21/2013  FINDINGS: There is moderate cardiomegaly with a few interstitial Kerley B-lines and trace pleural effusions. Central vascular congestion noted. No focal pulmonary opacity. No acute osseous finding.  IMPRESSION: Cardiomegaly with central vascular congestion and probable trace interstitial edema.   Electronically Signed   By: Christiana Pellant M.D.   On: 03/27/2013 19:08   Microbiology: Recent Results (from the past 240 hour(s))  MRSA PCR SCREENING     Status: None   Collection Time    04/06/13  6:51 AM      Result Value Range Status   MRSA by PCR NEGATIVE  NEGATIVE Final   Comment:            The GeneXpert MRSA Assay (FDA     approved for NASAL specimens     only), is one component of a     comprehensive MRSA  colonization     surveillance program. It is not     intended to diagnose MRSA     infection nor to guide or     monitor treatment for     MRSA infections.    Labs: Basic Metabolic Panel:  Recent Labs Lab 04/06/13 0230 04/07/13 0650 04/09/13 1606 04/11/13 0926  NA 128* 133* 132* 133*  K 4.8 4.5 5.4* 4.9  CL 91* 98 92* 94*  CO2 18* 18* 24 24  GLUCOSE 681* 170* 223* 250*  BUN 51* 64* 43* 51*  CREATININE 9.60* 11.38* 9.43* 7.70*  CALCIUM 7.4* 7.5* 7.9* 7.8*  PHOS  --   --  4.6  --    Liver Function Tests:  Recent Labs Lab 04/09/13 1606  ALBUMIN 3.4*   No results found for this basename: LIPASE, AMYLASE,  in the last 168 hours No results found for this basename: AMMONIA,  in the last 168 hours CBC:  Recent Labs Lab 04/06/13 0230 04/06/13 0840 04/09/13 1606 04/11/13 0926  WBC 7.2 6.6 6.2 4.6  NEUTROABS 6.2  --   --   --   HGB 10.8* 9.7* 10.5* 10.1*  HCT 33.6* 30.4* 33.0* 30.8*  MCV 104.0* 101.3* 104.1* 102.3*  PLT 152 152 158 106*   Cardiac Enzymes: No results found for this basename: CKTOTAL, CKMB, CKMBINDEX, TROPONINI,  in the last 168 hours BNP: BNP (last 3 results) No results found for this basename: PROBNP,  in the last 8760 hours CBG:  Recent Labs Lab 04/10/13 1556 04/10/13 1630 04/10/13 1742 04/10/13 2023 04/11/13 0802  GLUCAP 99 106* 138* 163* 230*   Signed:  Perle Brickhouse  [facility] 04/11/2013, 1:25 PM

## 2013-04-11 NOTE — Progress Notes (Addendum)
Subjective:  Awoken from sleep, leg discomfort improving daily , for hd today.  Hypoglycemia over course of day yesterday.   Objective Vital signs in last 24 hours: Filed Vitals:   04/10/13 1328 04/10/13 1631 04/10/13 2025 04/11/13 0500  BP: 119/65 132/73 135/69 122/64  Pulse: 69 72 78 73  Temp: 97.4 F (36.3 C)  98.1 F (36.7 C) 97.9 F (36.6 C)  TempSrc:   Oral Oral  Resp: 18 18 18 18   Height:      Weight:      SpO2: 100% 100% 97% 97%   Weight change:   Intake/Output Summary (Last 24 hours) at 04/11/13 1308 Last data filed at 04/10/13 1833  Gross per 24 hour  Intake   1194 ml  Output      0 ml  Net   1194 ml   Labs: Basic Metabolic Panel:  Recent Labs Lab 04/06/13 0230 04/07/13 0650 04/09/13 1606  NA 128* 133* 132*  K 4.8 4.5 5.4*  CL 91* 98 92*  CO2 18* 18* 24  GLUCOSE 681* 170* 223*  BUN 51* 64* 43*  CREATININE 9.60* 11.38* 9.43*  CALCIUM 7.4* 7.5* 7.9*  PHOS  --   --  4.6   Liver Function Tests:  Recent Labs Lab 04/09/13 1606  ALBUMIN 3.4*   No results found for this basename: LIPASE, AMYLASE,  in the last 168 hours No results found for this basename: AMMONIA,  in the last 168 hours CBC:  Recent Labs Lab 04/06/13 0230 04/06/13 0840 04/09/13 1606  WBC 7.2 6.6 6.2  NEUTROABS 6.2  --   --   HGB 10.8* 9.7* 10.5*  HCT 33.6* 30.4* 33.0*  MCV 104.0* 101.3* 104.1*  PLT 152 152 158   Cardiac Enzymes: No results found for this basename: CKTOTAL, CKMB, CKMBINDEX, TROPONINI,  in the last 168 hours CBG:  Recent Labs Lab 04/10/13 1535 04/10/13 1556 04/10/13 1630 04/10/13 1742 04/10/13 2023  GLUCAP 125* 99 106* 138* 163*    Iron Studies: No results found for this basename: IRON, TIBC, TRANSFERRIN, FERRITIN,  in the last 72 hours Studies/Results: No results found. Medications:   . amLODipine  5 mg Oral Daily  . calcium acetate  2,001-2,668 mg Oral TID WC  . clonazePAM  1 mg Oral QHS  . darbepoetin (ARANESP) injection - DIALYSIS  100 mcg  Intravenous Q Sat-HD  . docusate sodium  100 mg Oral BID  . feeding supplement (NEPRO CARB STEADY)  237 mL Oral TID WC  . heparin  5,000 Units Subcutaneous Q8H  . insulin aspart  0-9 Units Subcutaneous TID WC  . insulin aspart  3 Units Subcutaneous TID PC  . insulin glargine  4 Units Subcutaneous QHS  . levofloxacin (LEVAQUIN) IV  750 mg Intravenous Once  . levofloxacin  500 mg Oral Q48H  . metoprolol succinate  100 mg Oral QHS  . multivitamin  1 tablet Oral QHS  . predniSONE  5 mg Oral Q breakfast  . senna  1 tablet Oral Daily  . tacrolimus  1 mg Oral BID  . vancomycin  750 mg Intravenous Q T,Th,Sa-HD    Physical Exam General: Alert bf nad  Heart: RRR, no rub or murmur  Lungs: Clear bilaterally , no rales, wheezing  Abdomen: BS pos. Normal , soft , nontender  Extremities: NO bipedal edema  Dialysis Access: Pos. Bruit R Fem Avgg/ palpable pain continues improving daily to site   Dialysis Orders: Center: [facility] on TTS .  EDW 68.5kg HD ,  3 hrs Time , [place] 2.o k , 2.5 ca Heparin 0. Access R Fem AVGG BFR 400 DFR auto flow 1.5  Hectorol1.0 mcg mcg IV/HD Epogen 10000 Units IV/HD Venofer 0  Last op labs 02/23/13 = tfs=24 % / ferritin 1750 =12/18/12/ ipth 213 02/23/13   Assessment/Plan   1DKA = Managment per Admitt Team. BS now stabilizing/. 2. SP R Fem AVGG Pseudoaneurysm Repair/ resection 04/02/13) =Pain resolving and seen by Dr. Hart [facility] , "no infection noted" / [place] no abscess, wbc normal/ on vanco iv/ po levaquin  3.Recurrent Abdominal pain - per admit / tolerating meals  4.ESRD - HD TTS ( nw) on schedule use leg avgg  No recent cannulation problems  5.Transplant Medical Suppression Meds = see prev notes.  Off MMF.  Tac off in 1 mo.  Off pred in 3-84mo based on clinical status.   6. Hypertension/volume -stablebp , uf with hd today eating well , lower edw at dc/ amlodipine 5mg  , Metoprolol 100 hs  7. Anemia - hgb 9.7>10.7 Aranesp 150 mcg on hd , no venofer  8. Metabolic bone disease  - Vit d and binders  9. Nutrition - Carb mod . Renal diet, Renal Vit  Lenny Pastel, PA-C [facility] [phone] 04/11/2013,8:22 AM  LOS: 5 days   I saw the patient and agree with the above assessment and plan.    Sabra Heck, MD [phone] pgr

## 2013-04-13 LAB — GLUCOSE, CAPILLARY: Glucose-Capillary: 23 mg/dL — CL (ref 70–99)

## 2013-04-28 ENCOUNTER — Encounter: Payer: Self-pay | Admitting: Vascular Surgery

## 2013-04-29 ENCOUNTER — Encounter: Payer: Medicare Other | Admitting: Vascular Surgery

## 2013-05-03 ENCOUNTER — Emergency Department (HOSPITAL_COMMUNITY): Payer: Medicare Other

## 2013-05-03 ENCOUNTER — Encounter (HOSPITAL_COMMUNITY): Payer: Self-pay | Admitting: Emergency Medicine

## 2013-05-03 ENCOUNTER — Other Ambulatory Visit: Payer: Self-pay

## 2013-05-03 ENCOUNTER — Inpatient Hospital Stay (HOSPITAL_COMMUNITY): Payer: Medicare Other

## 2013-05-03 ENCOUNTER — Inpatient Hospital Stay (HOSPITAL_COMMUNITY)
Admission: EM | Admit: 2013-05-03 | Discharge: 2013-05-10 | DRG: 682 | Disposition: A | Discharge status: Home / Self Care | Payer: Medicare Other | Attending: Internal Medicine | Admitting: Internal Medicine

## 2013-05-03 DIAGNOSIS — IMO0002 Reserved for concepts with insufficient information to code with codable children: Secondary | ICD-10-CM

## 2013-05-03 DIAGNOSIS — Z992 Dependence on renal dialysis: Secondary | ICD-10-CM

## 2013-05-03 DIAGNOSIS — D696 Thrombocytopenia, unspecified: Secondary | ICD-10-CM | POA: Diagnosis not present

## 2013-05-03 DIAGNOSIS — Z9119 Patient's noncompliance with other medical treatment and regimen: Secondary | ICD-10-CM

## 2013-05-03 DIAGNOSIS — E1029 Type 1 diabetes mellitus with other diabetic kidney complication: Secondary | ICD-10-CM

## 2013-05-03 DIAGNOSIS — J189 Pneumonia, unspecified organism: Secondary | ICD-10-CM | POA: Diagnosis present

## 2013-05-03 DIAGNOSIS — N2581 Secondary hyperparathyroidism of renal origin: Secondary | ICD-10-CM | POA: Diagnosis present

## 2013-05-03 DIAGNOSIS — F3289 Other specified depressive episodes: Secondary | ICD-10-CM | POA: Diagnosis present

## 2013-05-03 DIAGNOSIS — L659 Nonscarring hair loss, unspecified: Secondary | ICD-10-CM

## 2013-05-03 DIAGNOSIS — E1142 Type 2 diabetes mellitus with diabetic polyneuropathy: Secondary | ICD-10-CM | POA: Diagnosis present

## 2013-05-03 DIAGNOSIS — G9341 Metabolic encephalopathy: Secondary | ICD-10-CM | POA: Diagnosis present

## 2013-05-03 DIAGNOSIS — G9340 Encephalopathy, unspecified: Secondary | ICD-10-CM | POA: Diagnosis present

## 2013-05-03 DIAGNOSIS — I120 Hypertensive chronic kidney disease with stage 5 chronic kidney disease or end stage renal disease: Principal | ICD-10-CM | POA: Diagnosis present

## 2013-05-03 DIAGNOSIS — Z794 Long term (current) use of insulin: Secondary | ICD-10-CM

## 2013-05-03 DIAGNOSIS — E1039 Type 1 diabetes mellitus with other diabetic ophthalmic complication: Secondary | ICD-10-CM | POA: Diagnosis present

## 2013-05-03 DIAGNOSIS — T827XXA Infection and inflammatory reaction due to other cardiac and vascular devices, implants and grafts, initial encounter: Secondary | ICD-10-CM

## 2013-05-03 DIAGNOSIS — E162 Hypoglycemia, unspecified: Secondary | ICD-10-CM

## 2013-05-03 DIAGNOSIS — Z940 Kidney transplant status: Secondary | ICD-10-CM

## 2013-05-03 DIAGNOSIS — K219 Gastro-esophageal reflux disease without esophagitis: Secondary | ICD-10-CM | POA: Diagnosis present

## 2013-05-03 DIAGNOSIS — Z9483 Pancreas transplant status: Secondary | ICD-10-CM

## 2013-05-03 DIAGNOSIS — J438 Other emphysema: Secondary | ICD-10-CM | POA: Diagnosis present

## 2013-05-03 DIAGNOSIS — D638 Anemia in other chronic diseases classified elsewhere: Secondary | ICD-10-CM | POA: Diagnosis present

## 2013-05-03 DIAGNOSIS — Z91199 Patient's noncompliance with other medical treatment and regimen due to unspecified reason: Secondary | ICD-10-CM

## 2013-05-03 DIAGNOSIS — F329 Major depressive disorder, single episode, unspecified: Secondary | ICD-10-CM | POA: Diagnosis present

## 2013-05-03 DIAGNOSIS — G43909 Migraine, unspecified, not intractable, without status migrainosus: Secondary | ICD-10-CM | POA: Diagnosis present

## 2013-05-03 DIAGNOSIS — Z9115 Patient's noncompliance with renal dialysis: Secondary | ICD-10-CM

## 2013-05-03 DIAGNOSIS — Z91158 Patient's noncompliance with renal dialysis for other reason: Secondary | ICD-10-CM

## 2013-05-03 DIAGNOSIS — R4182 Altered mental status, unspecified: Secondary | ICD-10-CM

## 2013-05-03 DIAGNOSIS — Z79899 Other long term (current) drug therapy: Secondary | ICD-10-CM

## 2013-05-03 DIAGNOSIS — N186 End stage renal disease: Secondary | ICD-10-CM

## 2013-05-03 DIAGNOSIS — J9600 Acute respiratory failure, unspecified whether with hypoxia or hypercapnia: Secondary | ICD-10-CM

## 2013-05-03 DIAGNOSIS — E1049 Type 1 diabetes mellitus with other diabetic neurological complication: Secondary | ICD-10-CM | POA: Diagnosis present

## 2013-05-03 DIAGNOSIS — E785 Hyperlipidemia, unspecified: Secondary | ICD-10-CM | POA: Diagnosis present

## 2013-05-03 DIAGNOSIS — E1065 Type 1 diabetes mellitus with hyperglycemia: Secondary | ICD-10-CM | POA: Diagnosis present

## 2013-05-03 DIAGNOSIS — K5289 Other specified noninfective gastroenteritis and colitis: Secondary | ICD-10-CM | POA: Diagnosis present

## 2013-05-03 DIAGNOSIS — E11319 Type 2 diabetes mellitus with unspecified diabetic retinopathy without macular edema: Secondary | ICD-10-CM | POA: Diagnosis present

## 2013-05-03 DIAGNOSIS — R739 Hyperglycemia, unspecified: Secondary | ICD-10-CM

## 2013-05-03 DIAGNOSIS — N058 Unspecified nephritic syndrome with other morphologic changes: Secondary | ICD-10-CM

## 2013-05-03 LAB — COMPREHENSIVE METABOLIC PANEL
ALT: 9 U/L (ref 0–35)
Alkaline Phosphatase: 199 U/L — ABNORMAL HIGH (ref 39–117)
BUN: 26 mg/dL — ABNORMAL HIGH (ref 6–23)
Calcium: 8 mg/dL — ABNORMAL LOW (ref 8.4–10.5)
Chloride: 98 mEq/L (ref 96–112)
GFR calc Af Amer: 5 mL/min — ABNORMAL LOW (ref >90)
Glucose, Bld: 31 mg/dL — CL (ref 70–99)
Potassium: 4.4 mEq/L (ref 3.5–5.1)
Total Bilirubin: 0.5 mg/dL (ref 0.3–1.2)

## 2013-05-03 LAB — CBC WITH DIFFERENTIAL/PLATELET
Eosinophils Absolute: 0.1 10*3/uL (ref 0.0–0.7)
HCT: 42 % (ref 36.0–46.0)
Hemoglobin: 13.1 g/dL (ref 12.0–15.0)
Lymphs Abs: 1.1 10*3/uL (ref 0.7–4.0)
MCH: 32.8 pg (ref 26.0–34.0)
MCV: 105.3 fL — ABNORMAL HIGH (ref 78.0–100.0)
Monocytes Absolute: 0.4 10*3/uL (ref 0.1–1.0)
Monocytes Relative: 4 % (ref 3–12)
Neutro Abs: 8.5 10*3/uL — ABNORMAL HIGH (ref 1.7–7.7)
Neutrophils Relative %: 85 % — ABNORMAL HIGH (ref 43–77)
RBC: 3.99 MIL/uL (ref 3.87–5.11)
RDW: 15.3 % (ref 11.5–15.5)

## 2013-05-03 LAB — POCT I-STAT 3, ART BLOOD GAS (G3+)
O2 Saturation: 95 %
Patient temperature: 98.6
pO2, Arterial: 84 mmHg (ref 80.0–100.0)

## 2013-05-03 LAB — GLUCOSE, CAPILLARY: Glucose-Capillary: 150 mg/dL — ABNORMAL HIGH (ref 70–99)

## 2013-05-03 LAB — HEPATITIS B SURFACE ANTIGEN: Hepatitis B Surface Ag: NEGATIVE

## 2013-05-03 LAB — PRO B NATRIURETIC PEPTIDE: Pro B Natriuretic peptide (BNP): 33341 pg/mL — ABNORMAL HIGH (ref 0–125)

## 2013-05-03 MED ORDER — AMLODIPINE BESYLATE 10 MG PO TABS
10.0000 mg | ORAL_TABLET | Freq: Every day | ORAL | Status: DC
  Administered 2013-05-03 – 2013-05-10 (×7): 10 mg via ORAL
  Filled 2013-05-03 (×8): qty 1

## 2013-05-03 MED ORDER — ACETAMINOPHEN 500 MG PO TABS
500.0000 mg | ORAL_TABLET | Freq: Four times a day (QID) | ORAL | Status: DC | PRN
  Administered 2013-05-03 – 2013-05-04 (×2): 500 mg via ORAL
  Filled 2013-05-03 (×3): qty 1

## 2013-05-03 MED ORDER — PREDNISONE 5 MG PO TABS
5.0000 mg | ORAL_TABLET | Freq: Every day | ORAL | Status: DC
  Administered 2013-05-03 – 2013-05-10 (×8): 5 mg via ORAL
  Filled 2013-05-03 (×8): qty 1

## 2013-05-03 MED ORDER — ONDANSETRON HCL 4 MG PO TABS
4.0000 mg | ORAL_TABLET | Freq: Four times a day (QID) | ORAL | Status: DC | PRN
  Administered 2013-05-05: 4 mg via ORAL
  Filled 2013-05-03: qty 1

## 2013-05-03 MED ORDER — METOPROLOL SUCCINATE ER 100 MG PO TB24
100.0000 mg | ORAL_TABLET | Freq: Every day | ORAL | Status: DC
  Administered 2013-05-03 – 2013-05-09 (×6): 100 mg via ORAL
  Filled 2013-05-03 (×8): qty 1

## 2013-05-03 MED ORDER — NEPRO/CARBSTEADY PO LIQD
237.0000 mL | ORAL | Status: DC
  Administered 2013-05-04 – 2013-05-09 (×4): 237 mL via ORAL

## 2013-05-03 MED ORDER — DEXTROSE 50 % IV SOLN
1.0000 | Freq: Once | INTRAVENOUS | Status: AC
  Administered 2013-05-03: 50 mL via INTRAVENOUS

## 2013-05-03 MED ORDER — CALCIUM ACETATE 667 MG PO CAPS
2001.0000 mg | ORAL_CAPSULE | Freq: Three times a day (TID) | ORAL | Status: DC
  Administered 2013-05-04 – 2013-05-05 (×5): 2001 mg via ORAL
  Filled 2013-05-03 (×11): qty 3

## 2013-05-03 MED ORDER — SODIUM CHLORIDE 0.9 % IV SOLN: 100.0000 mL | INTRAVENOUS | Status: DC | PRN

## 2013-05-03 MED ORDER — GUAIFENESIN-DM 100-10 MG/5ML PO SYRP
5.0000 mL | ORAL_SOLUTION | ORAL | Status: DC | PRN
  Administered 2013-05-03: 5 mL via ORAL
  Filled 2013-05-03: qty 5

## 2013-05-03 MED ORDER — GABAPENTIN 100 MG PO CAPS
100.0000 mg | ORAL_CAPSULE | Freq: Every day | ORAL | Status: DC
  Administered 2013-05-03 – 2013-05-10 (×8): 100 mg via ORAL
  Filled 2013-05-03 (×8): qty 1

## 2013-05-03 MED ORDER — DEXTROSE 50 % IV SOLN
INTRAVENOUS | Status: AC
  Administered 2013-05-03: 50 mL via INTRAVENOUS
  Filled 2013-05-03: qty 50

## 2013-05-03 MED ORDER — PANTOPRAZOLE SODIUM 40 MG PO TBEC
40.0000 mg | DELAYED_RELEASE_TABLET | Freq: Every day | ORAL | Status: DC
  Administered 2013-05-03 – 2013-05-09 (×7): 40 mg via ORAL
  Filled 2013-05-03 (×7): qty 1

## 2013-05-03 MED ORDER — VANCOMYCIN HCL 10 G IV SOLR
1500.0000 mg | Freq: Once | INTRAVENOUS | Status: AC
  Administered 2013-05-03: 1500 mg via INTRAVENOUS
  Filled 2013-05-03: qty 1500

## 2013-05-03 MED ORDER — INSULIN ASPART 100 UNIT/ML ~~LOC~~ SOLN
0.0000 [IU] | Freq: Three times a day (TID) | SUBCUTANEOUS | Status: DC
  Administered 2013-05-03: 1 [IU] via SUBCUTANEOUS
  Administered 2013-05-04: 9 [IU] via SUBCUTANEOUS
  Administered 2013-05-05: 2 [IU] via SUBCUTANEOUS
  Administered 2013-05-05 – 2013-05-06 (×2): 9 [IU] via SUBCUTANEOUS

## 2013-05-03 MED ORDER — ALTEPLASE 2 MG IJ SOLR: 2.0000 mg | Freq: Once | INTRAMUSCULAR | Status: DC | PRN

## 2013-05-03 MED ORDER — VANCOMYCIN HCL IN DEXTROSE 750-5 MG/150ML-% IV SOLN
750.0000 mg | INTRAVENOUS | Status: DC
  Administered 2013-05-05: 750 mg via INTRAVENOUS
  Filled 2013-05-03 (×3): qty 150

## 2013-05-03 MED ORDER — IBUPROFEN 600 MG PO TABS
600.0000 mg | ORAL_TABLET | Freq: Four times a day (QID) | ORAL | Status: DC | PRN
  Administered 2013-05-03 – 2013-05-04 (×2): 600 mg via ORAL
  Filled 2013-05-03 (×2): qty 1

## 2013-05-03 MED ORDER — GLUCAGON HCL (RDNA) 1 MG IJ SOLR
5.0000 mg | Freq: Once | INTRAVENOUS | Status: AC
  Administered 2013-05-03: 5 mg via INTRAVENOUS
  Filled 2013-05-03: qty 5

## 2013-05-03 MED ORDER — DIPHENHYDRAMINE HCL 50 MG/ML IJ SOLN
25.0000 mg | Freq: Once | INTRAMUSCULAR | Status: AC
  Administered 2013-05-03: 25 mg via INTRAVENOUS
  Filled 2013-05-03: qty 1

## 2013-05-03 MED ORDER — IRBESARTAN 150 MG PO TABS
150.0000 mg | ORAL_TABLET | Freq: Every day | ORAL | Status: DC
Start: 2013-05-03 — End: 2013-05-10
  Administered 2013-05-03 – 2013-05-10 (×7): 150 mg via ORAL
  Filled 2013-05-03 (×8): qty 1

## 2013-05-03 MED ORDER — ALBUTEROL SULFATE (5 MG/ML) 0.5% IN NEBU: 2.5000 mg | INHALATION_SOLUTION | RESPIRATORY_TRACT | Status: DC | PRN

## 2013-05-03 MED ORDER — ONDANSETRON HCL 4 MG/2ML IJ SOLN
4.0000 mg | Freq: Four times a day (QID) | INTRAMUSCULAR | Status: DC | PRN
  Administered 2013-05-03 – 2013-05-09 (×10): 4 mg via INTRAVENOUS
  Filled 2013-05-03 (×9): qty 2

## 2013-05-03 MED ORDER — MONTELUKAST SODIUM 10 MG PO TABS
10.0000 mg | ORAL_TABLET | Freq: Every day | ORAL | Status: DC
  Administered 2013-05-03 – 2013-05-09 (×7): 10 mg via ORAL
  Filled 2013-05-03 (×8): qty 1

## 2013-05-03 MED ORDER — SUMATRIPTAN SUCCINATE 25 MG PO TABS
25.0000 mg | ORAL_TABLET | Freq: Two times a day (BID) | ORAL | Status: DC | PRN
  Administered 2013-05-03 – 2013-05-04 (×2): 25 mg via ORAL
  Filled 2013-05-03 (×5): qty 1

## 2013-05-03 MED ORDER — HEPARIN SODIUM (PORCINE) 5000 UNIT/ML IJ SOLN
5000.0000 [IU] | Freq: Three times a day (TID) | INTRAMUSCULAR | Status: DC
  Administered 2013-05-08: 5000 [IU] via SUBCUTANEOUS
  Filled 2013-05-03 (×23): qty 1

## 2013-05-03 MED ORDER — PENTAFLUOROPROP-TETRAFLUOROETH EX AERO: 1.0000 "application " | INHALATION_SPRAY | CUTANEOUS | Status: DC | PRN

## 2013-05-03 MED ORDER — LIDOCAINE HCL (PF) 1 % IJ SOLN: 5.0000 mL | INTRAMUSCULAR | Status: DC | PRN

## 2013-05-03 MED ORDER — LIDOCAINE-PRILOCAINE 2.5-2.5 % EX CREA: 1.0000 "application " | TOPICAL_CREAM | CUTANEOUS | Status: DC | PRN

## 2013-05-03 MED ORDER — PIPERACILLIN-TAZOBACTAM IN DEX 2-0.25 GM/50ML IV SOLN
2.2500 g | Freq: Three times a day (TID) | INTRAVENOUS | Status: DC
  Administered 2013-05-03 – 2013-05-07 (×11): 2.25 g via INTRAVENOUS
  Filled 2013-05-03 (×16): qty 50

## 2013-05-03 MED ORDER — NEPRO/CARBSTEADY PO LIQD: 237.0000 mL | ORAL | Status: DC | PRN

## 2013-05-03 MED ORDER — HYDRALAZINE HCL 20 MG/ML IJ SOLN
10.0000 mg | Freq: Four times a day (QID) | INTRAMUSCULAR | Status: DC | PRN
  Administered 2013-05-03: 10 mg via INTRAVENOUS
  Filled 2013-05-03: qty 1

## 2013-05-03 MED ORDER — AMLODIPINE BESYLATE-VALSARTAN 10-160 MG PO TABS: 1.0000 | ORAL_TABLET | Freq: Every day | ORAL | Status: DC

## 2013-05-03 MED ORDER — SODIUM CHLORIDE 0.9 % IJ SOLN
3.0000 mL | Freq: Two times a day (BID) | INTRAMUSCULAR | Status: DC
  Administered 2013-05-04 – 2013-05-09 (×9): 3 mL via INTRAVENOUS

## 2013-05-03 MED ORDER — CLONAZEPAM 1 MG PO TABS
1.0000 mg | ORAL_TABLET | Freq: Every day | ORAL | Status: DC
  Administered 2013-05-03 – 2013-05-09 (×7): 1 mg via ORAL
  Filled 2013-05-03: qty 2
  Filled 2013-05-03 (×5): qty 1
  Filled 2013-05-03: qty 2

## 2013-05-03 MED ORDER — HEPARIN SODIUM (PORCINE) 1000 UNIT/ML DIALYSIS: 1000.0000 [IU] | INTRAMUSCULAR | Status: DC | PRN

## 2013-05-03 MED ORDER — POLYETHYLENE GLYCOL 3350 17 G PO PACK
17.0000 g | PACK | Freq: Every day | ORAL | Status: DC | PRN
  Filled 2013-05-03: qty 1

## 2013-05-03 MED ORDER — ALUM & MAG HYDROXIDE-SIMETH 200-200-20 MG/5ML PO SUSP: 30.0000 mL | Freq: Four times a day (QID) | ORAL | Status: DC | PRN

## 2013-05-03 MED ORDER — ALBUTEROL SULFATE HFA 108 (90 BASE) MCG/ACT IN AERS
2.0000 | INHALATION_SPRAY | RESPIRATORY_TRACT | Status: DC | PRN
  Filled 2013-05-03: qty 6.7

## 2013-05-03 MED ORDER — RENA-VITE PO TABS
1.0000 | ORAL_TABLET | Freq: Every day | ORAL | Status: DC
  Administered 2013-05-03 – 2013-05-08 (×6): 1 via ORAL
  Administered 2013-05-09: 22:00:00 via ORAL
  Filled 2013-05-03 (×9): qty 1

## 2013-05-03 MED ORDER — TACROLIMUS 1 MG PO CAPS
1.0000 mg | ORAL_CAPSULE | Freq: Two times a day (BID) | ORAL | Status: DC
  Administered 2013-05-03 – 2013-05-08 (×11): 1 mg via ORAL
  Filled 2013-05-03 (×15): qty 1

## 2013-05-03 MED ORDER — ONDANSETRON HCL 4 MG/2ML IJ SOLN: 4.0000 mg | Freq: Once | INTRAMUSCULAR | Status: DC

## 2013-05-03 NOTE — ED Provider Notes (Signed)
CSN: [id number]     Arrival date & time 05/03/13  4098 History   First MD Initiated Contact with Patient 05/03/13 0631     Chief Complaint  Patient presents with  . Altered Mental Status   (Consider location/radiation/quality/duration/timing/severity/associated sxs/prior Treatment) Patient is a 42 y.o. female presenting with altered mental status. The history is provided by the patient. No language interpreter was used.  Altered Mental Status Presenting symptoms: lethargy and partial responsiveness   Severity:  Severe Most recent episode:  Today Episode history:  Single Timing:  Constant Progression:  Improving Context: recent illness   Associated symptoms: difficulty breathing   Pt found unresponsive with difficulty breathing by Boyfriend.   EMS reports pt unresponsive when they arrived,   Pt has become more alert since being placed on a nonrebreather.  EMS thinks pt had a seizure and aspirated.  Pt is on dialysis.  She is reported to have missed dialysis on Saturday.   Last dialysis Thursday.   Past Medical History  Diagnosis Date  . Hyperlipidemia   . Alopecia   . Obesity   . Iron deficiency   . 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 RLS (restless legs syndrome)   . Respiratory failure   . Injury of conjunctiva and corneal abrasion of right eye without foreign body   . Cellulitis   . Dialysis patient     Tues; Thurs; Sat; [place]  . 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 Anemia   . Blood transfusion 2004    "when I had my transplant"  . Migraine   . 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 Hyperparathyroidism, secondary   . Diabetic retinopathy   . Hypertension     takes Metoprolol and Exforge daily  . Depression     takes Klonopin nightly  . Diabetes mellitus type 1     Pt states diagnosed at age 89 with prior episodes of DKA. S/p  pancreatic transplant   . GERD (gastroesophageal reflux disease)     takes Protonix daily  . PONV (postoperative nausea and vomiting)   . Bronchitis   . Migraine   . Asthma   . Shortness of breath   . Emphysema of lung   . DKA (diabetic ketoacidoses) 07/02/2012  . Angina     none in past 2 years.  . Renal insufficiency   . Stomach pain     Hx: of chronic  . Pneumonia 2012    "double"   Past Surgical History  Procedure Laterality Date  . 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 Thyroglobulin      x 7  . Av fistula placement      right upper arm  . Cholecystectomy  1995  .  hd graft placement/removal      "had 2 in my left upper arm"  . Eye surgery    . Retinopathy surgery    . Tooth extraction  10/10/11    X's two  . Insertion of dialysis catheter  12/08/2011    Procedure: INSERTION OF DIALYSIS CATHETER;  Surgeon: Chuck Hint, MD;  Location: [facility] OR;  Service: Vascular;  Laterality: Left;  . Av fistula placement  01/22/2012    Procedure: INSERTION OF ARTERIOVENOUS (AV) GORE-TEX GRAFT ARM;  Surgeon: Chuck Hint, MD;  Location: [facility]  OR;  Service: Vascular;  Laterality: Left;  Ultrasound guided.  . Av fistula placement  03/14/2012    Procedure: INSERTION OF ARTERIOVENOUS (AV) GORE-TEX GRAFT THIGH;  Surgeon: Chuck Hint, MD;  Location: [facility] OR;  Service: Vascular;  Laterality: Right;  . False aneurysm repair Right 01/02/2013    Procedure: Excision of lymphocele in right thigh.;  Surgeon: Chuck Hint, MD;  Location: [facility] OR;  Service: Vascular;  Laterality: Right;  . Carpal tunnel release Right 03/02/2013    Procedure: CARPAL TUNNEL RELEASE;  Surgeon: Tami Ribas, MD;  Location: [facility];  Service: Orthopedics;  Laterality: Right;  . Flexible sigmoidoscopy N/A 03/24/2013    Procedure: FLEXIBLE SIGMOIDOSCOPY;  Surgeon: Florencia Reasons, MD;  Location: [facility] ENDOSCOPY;  Service: Endoscopy;  Laterality:  N/A;  . Revision of arteriovenous goretex graft Right 04/02/2013    Procedure: REPAIR OF PSEUDOANEURYSM OF ARTERIOVENOUS GORETEX GRAFT;  Surgeon: Chuck Hint, MD;  Location: [facility] OR;  Service: Vascular;  Laterality: Right;   Family History  Problem Relation Age of Onset  . Hypertension Mother   . Kidney disease Mother   . Diabetes Mother   . Hypertension Father   . Kidney disease Father   . Diabetes Father    History  Substance Use Topics  . Smoking status: Never Smoker   . Smokeless tobacco: Never Used  . Alcohol Use: No   OB History   Grav Para Term Preterm Abortions TAB SAB Ect Mult Living                 Review of Systems  Unable to perform ROS Respiratory: Positive for chest tightness, shortness of breath and wheezing.   All other systems reviewed and are negative.    Allergies  Depakote; Morphine and related; Penicillins; Tramadol; and Vicodin  Home Medications   Current Outpatient Rx  Name  Route  Sig  Dispense  Refill  . albuterol (PROVENTIL HFA;VENTOLIN HFA) 108 (90 BASE) MCG/ACT inhaler   Inhalation   Inhale 2 puffs into the lungs every 4 (four) hours as needed for wheezing or shortness of breath (as per home regimen).   1 Inhaler   0   . calcium acetate (PHOSLO) 667 MG capsule   Oral   Take 2,001-2,668 mg by mouth 3 (three) times daily with meals. Dosage depends on meal consumed         . clonazePAM (KLONOPIN) 0.5 MG tablet   Oral   Take 1 mg by mouth at bedtime.          Marland Kitchen EXFORGE 10-160 MG per tablet   Oral   Take 1 tablet by mouth at bedtime.          . gabapentin (NEURONTIN) 100 MG capsule   Oral   Take 100 mg by mouth daily.         Marland Kitchen glucose blood (ACCU-CHEK AVIVA) test strip      Test BS 4x/day. Dx  251.2, 250.41   100 each   0   . HYDROcodone-acetaminophen (NORCO) 5-325 MG per tablet   Oral   Take 1 tablet by mouth every 6 (six) hours as needed.   20 tablet   0   . insulin glargine (LANTUS) 100 UNIT/ML  injection   Subcutaneous   Inject 0.05 mLs (5 Units total) into the skin daily.   10 mL   2   . Lancets (ACCU-CHEK SOFT TOUCH) lancets      Use as instructed  100 each   0   . metoprolol succinate (TOPROL-XL) 100 MG 24 hr tablet   Oral   Take 100 mg by mouth at bedtime. Take with or immediately following a meal.         . multivitamin (RENA-VIT) TABS tablet   Oral   Take 1 tablet by mouth daily.         . Nutritional Supplements (FEEDING SUPPLEMENT, NEPRO CARB STEADY,) LIQD   Oral   Take 237 mLs by mouth daily.      0   . pantoprazole (PROTONIX) 40 MG tablet   Oral   Take 1 tablet (40 mg total) by mouth at bedtime.   30 tablet   0   . predniSONE (DELTASONE) 5 MG tablet   Oral   Take 5 mg by mouth daily.          . tacrolimus (PROGRAF) 1 MG capsule   Oral   Take 1 mg by mouth 2 (two) times daily.          LMP 04/12/2013 Physical Exam  Constitutional: She appears well-developed and well-nourished.  HENT:  Head: Normocephalic.  Neck: Normal range of motion. Neck supple.  Cardiovascular:  tachy  Pulmonary/Chest: She is in respiratory distress. She has wheezes. She has rales.  Abdominal: Soft.  Musculoskeletal: Normal range of motion.  Neurological: She is alert.  Skin: Skin is warm.  Psychiatric: She has a normal mood and affect.    ED Course  Procedures (including critical care time) Labs Review Labs Reviewed  CBC WITH DIFFERENTIAL  COMPREHENSIVE METABOLIC PANEL  PRO B NATRIURETIC PEPTIDE  TROPONIN I   Imaging Review Dg Chest Port 1 View  05/03/2013   CLINICAL DATA:  Shortness of breath.  EXAM: PORTABLE CHEST - 1 VIEW  COMPARISON:  Chest radiograph performed 04/06/2013  FINDINGS: There is new right-sided airspace opacification, concerning for pneumonia. Asymmetric pulmonary edema is considered less likely. Mild left basilar airspace opacity is seen. A small right pleural effusion is suspected. No pneumothorax is seen.  The cardiomediastinal  silhouette is mildly enlarged. No acute osseous abnormalities are identified.  IMPRESSION: 1. New diffuse right-sided airspace opacification, concerning for pneumonia. Asymmetric pulmonary edema is considered less likely. Mild left basilar airspace opacity also seen. 2. Suspect small right pleural effusion. 3. Mild cardiomegaly.   Electronically Signed   By: Roanna Raider M.D.   On: 05/03/2013 06:52    EKG Interpretation   None       MDM   1. Hypoglycemia   2. Alopecia   3. DM (diabetes mellitus), type 1 with renal complications   4. ESRD (end stage renal disease)   5. Mental status, decreased   6. Acute respiratory failure    Dr. Ranae Palms at bedside,   ABG shows 02 of 84 on 10 liters,  Pc02 55 ph 7.30.  Chest xray shows pulmonary edema.   Pt placed on bi PAP.   Pt breathing easier.   Dr. Ranae Palms was able to obtain IV access.    I spoke to Dr. Arlean Hopping who will see for dialysis.    Lonia Skinner [place], PA-C 05/03/13 1114  Loren Racer, MD 05/07/13 [phone]

## 2013-05-03 NOTE — Progress Notes (Signed)
Weekend CSW attempted twice int he ED to provide Medicaid transportation resources, patient in restroom the first time and had already been transferred to the floor the second time. Inpatient CSW to provide resources at a later time.  Samuella Bruin, MSW, LCSWA Clinical Social Worker [facility] Emergency Dept. [phone]

## 2013-05-03 NOTE — ED Provider Notes (Addendum)
External jugular Angiocath insertion procedure note.  Patient placed in Trendelenburg procedure. Sterile prep of the right lateral neck. 20 Gauge Angiocath inserted into the R external jugular. Draws and flushes well. Patient tolerated well.  Medical screening examination/treatment/procedure(s) were conducted as a shared visit with non-physician practitioner(s) and myself.  I personally evaluated the patient during the encounter   Patient brought in by EMS for for distress and questionable seizure-like activity. Patient has diffuse crackles in all lung fields. Increase work of breathing. Reports missed dialysis. She was started on BiPAP with improvement. Chest x-ray with asymmetric infiltrates. Discussed with nephrology who will dialyze patient. [facility] is to admit.    Loren Racer, MD 05/04/13 [phone]  CRITICAL CARE Performed by: Loren Racer Total critical care time: 20 min Critical care time was exclusive of separately billable procedures and treating other patients. Critical care was necessary to treat or prevent imminent or life-threatening deterioration. Critical care was time spent personally by me on the following activities: development of treatment plan with patient and/or surrogate as well as nursing, discussions with consultants, evaluation of patient's response to treatment, examination of patient, obtaining history from patient or surrogate, ordering and performing treatments and interventions, ordering and review of laboratory studies, ordering and review of radiographic studies, pulse oximetry and re-evaluation of patient's condition.   Loren Racer, MD 05/04/13 [phone]

## 2013-05-03 NOTE — ED Notes (Signed)
Pt stated she is a Tuesday, Thursday, Saturday dialysis pt, but missed yesterday.  Pt states she did go on Thursday.

## 2013-05-03 NOTE — Progress Notes (Signed)
05/03/13 1015 nsg To unit 6E per stretcher accompanied by RN and NT alert and oriented patient in with fluid overload  Missed hd  and not feeling well; on nonrebreather; infusing IV; vomitting at time of transport; MD said she will be going to HD in 30 minutes; report called to HD; kept patient comfortable. Telemetry placed per order; Continued to monitor.

## 2013-05-03 NOTE — ED Notes (Signed)
Glucagon not in pyxis, called pharmacy they will send it up ASAP.

## 2013-05-03 NOTE — Consult Note (Signed)
Renal Service Consult Note [facility]  [name] 05/03/2013 [name] Requesting Physician:  Dr Thedore Mins  Reason for Consult:  REsp distress, ESRD HPI: The patient is a 42 y.o. year-old with hx of DM1, KP transplant, back on HD in 2008, HTN who was found on the floor at home this am by family member. EMS found pt to be partially responsive, put on NRB mask and brought to ED where was seen to have resp distress and pulm edema on exam and CXR. Bipap placed and is doing better now.  Asked to see for acute dialysis.   Pt says transport vehicle never came yesterday to pick her up for her Sat dialysis. Does not usually miss HD.  No other complaints  ROS  abd pain from November is better but still comes and goes  no bloody stool   no fevers or chills  no leg edema  no confusion   no problems getting medication  Past Medical History  Past Medical History  Diagnosis Date  . Hyperlipidemia   . Alopecia   . Obesity   . Iron deficiency   . 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 RLS (restless legs syndrome)   . Respiratory failure   . Injury of conjunctiva and corneal abrasion of right eye without foreign body   . Cellulitis   . Dialysis patient     Tues; Thurs; Sat; [place]  . 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 Anemia   . Blood transfusion 2004    "when I had my transplant"  . Migraine   . 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 Hyperparathyroidism, secondary   . Diabetic retinopathy   . Hypertension     takes Metoprolol and Exforge daily  . Depression     takes Klonopin nightly  . Diabetes mellitus type 1     Pt states diagnosed at age 16 with prior episodes of DKA. S/p pancreatic transplant   . GERD (gastroesophageal reflux disease)     takes Protonix daily  . PONV (postoperative nausea and vomiting)   .  Bronchitis   . Migraine   . Asthma   . Shortness of breath   . Emphysema of lung   . DKA (diabetic ketoacidoses) 07/02/2012  . Angina     none in past 2 years.  . Renal insufficiency   . Stomach pain     Hx: of chronic  . Pneumonia 2012    "double"   Past Surgical History  Past Surgical History  Procedure Laterality Date  . 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 Thyroglobulin      x 7  . Av fistula placement      right upper arm  . Cholecystectomy  1995  .  hd graft placement/removal      "had 2 in my left upper arm"  . Eye surgery    . Retinopathy surgery    . Tooth extraction  10/10/11    X's two  . Insertion of dialysis catheter  12/08/2011    Procedure: INSERTION OF DIALYSIS CATHETER;  Surgeon: Chuck Hint, MD;  Location: [facility] OR;  Service: Vascular;  Laterality: Left;  . Av fistula placement  01/22/2012    Procedure: INSERTION OF ARTERIOVENOUS (AV) GORE-TEX GRAFT ARM;  Surgeon: Di Kindle  Edilia Bo, MD;  Location: [facility] OR;  Service: Vascular;  Laterality: Left;  Ultrasound guided.  . Av fistula placement  03/14/2012    Procedure: INSERTION OF ARTERIOVENOUS (AV) GORE-TEX GRAFT THIGH;  Surgeon: Chuck Hint, MD;  Location: [facility] OR;  Service: Vascular;  Laterality: Right;  . False aneurysm repair Right 01/02/2013    Procedure: Excision of lymphocele in right thigh.;  Surgeon: Chuck Hint, MD;  Location: [facility] OR;  Service: Vascular;  Laterality: Right;  . Carpal tunnel release Right 03/02/2013    Procedure: CARPAL TUNNEL RELEASE;  Surgeon: Tami Ribas, MD;  Location: [facility];  Service: Orthopedics;  Laterality: Right;  . Flexible sigmoidoscopy N/A 03/24/2013    Procedure: FLEXIBLE SIGMOIDOSCOPY;  Surgeon: Florencia Reasons, MD;  Location: [facility] END[facility]COPY;  Service: Endoscopy;  Laterality: N/A;  . Revision of arteriovenous goretex graft Right 04/02/2013    Procedure: REPAIR OF PSEUDOANEURYSM OF ARTERIOVENOUS  GORETEX GRAFT;  Surgeon: Chuck Hint, MD;  Location: [facility] OR;  Service: Vascular;  Laterality: Right;   Family History  Family History  Problem Relation Age of Onset  . Hypertension Mother   . Kidney disease Mother   . Diabetes Mother   . Hypertension Father   . Kidney disease Father   . Diabetes Father    Social History  reports that she has never smoked. She has never used smokeless tobacco. She reports that she does not drink alcohol or use illicit drugs. Allergies  Allergies  Allergen Reactions  . Depakote [Divalproex Sodium] Other (See Comments)    Pt gets "delirious"  . Morphine And Related Nausea And Vomiting and Other (See Comments)    "makes me delirious"  . Penicillins Anaphylaxis  . Tramadol Nausea And Vomiting  . Vicodin [Hydrocodone-Acetaminophen] Itching and Rash   Home medications Prior to Admission medications   Medication Sig Start Date End Date Taking? Authorizing Provider  albuterol (PROVENTIL HFA;VENTOLIN HFA) 108 (90 BASE) MCG/ACT inhaler Inhale 2 puffs into the lungs every 4 (four) hours as needed for wheezing or shortness of breath (as per home regimen). 02/20/13  Yes Esperanza Sheets, MD  calcium acetate (PHOSLO) 667 MG capsule Take 2,001-2,668 mg by mouth 3 (three) times daily with meals. Dosage depends on meal consumed 03/12/11  Yes Historical Provider, MD  clonazePAM (KLONOPIN) 0.5 MG tablet Take 1 mg by mouth at bedtime.    Yes Historical Provider, MD  EXFORGE 10-160 MG per tablet Take 1 tablet by mouth at bedtime.  06/15/12  Yes Historical Provider, MD  gabapentin (NEURONTIN) 100 MG capsule Take 100 mg by mouth daily.   Yes Historical Provider, MD  HYDROcodone-acetaminophen (NORCO) 5-325 MG per tablet Take 1 tablet by mouth every 6 (six) hours as needed. 04/02/13  Yes Samantha J Rhyne, PA-C  insulin glargine (LANTUS) 100 UNIT/ML injection Inject 0.05 mLs (5 Units total) into the skin daily. 04/11/13  Yes Clanford Cyndie Mull, MD  metoprolol succinate  (TOPROL-XL) 100 MG 24 hr tablet Take 100 mg by mouth at bedtime. Take with or immediately following a meal.   Yes Historical Provider, MD  montelukast (SINGULAIR) 10 MG tablet Take 10 mg by mouth at bedtime.   Yes Historical Provider, MD  multivitamin (RENA-VIT) TABS tablet Take 1 tablet by mouth daily.   Yes Historical Provider, MD  Nutritional Supplements (FEEDING SUPPLEMENT, NEPRO CARB STEADY,) LIQD Take 237 mLs by mouth daily. 03/27/13  Yes Kathlen Mody, MD  pantoprazole (PROTONIX) 40 MG tablet Take 1 tablet (40 mg  total) by mouth at bedtime. 03/27/13  Yes Kathlen Mody, MD  predniSONE (DELTASONE) 5 MG tablet Take 5 mg by mouth daily.  01/21/11  Yes Historical Provider, MD  tacrolimus (PROGRAF) 1 MG capsule Take 1 mg by mouth 2 (two) times daily.   Yes Historical Provider, MD  zolpidem (AMBIEN CR) 12.5 MG CR tablet Take 12.5 mg by mouth at bedtime.   Yes Historical Provider, MD  glucose blood (ACCU-CHEK AVIVA) test strip Test BS 4x/day. Dx  251.2, 250.41 04/11/13   Clanford Cyndie Mull, MD  Lancets (ACCU-CHEK SOFT TOUCH) lancets Use as instructed 04/11/13   Clanford Cyndie Mull, MD   Liver Function Tests  Recent Labs Lab 05/03/13 0653  AST 21  ALT 9  ALKPHOS 199*  BILITOT 0.5  PROT 7.7  ALBUMIN 3.5   No results found for this basename: LIPASE, AMYLASE,  in the last 168 hours CBC  Recent Labs Lab 05/03/13 0653  WBC 10.0  NEUTROABS 8.5*  HGB 13.1  HCT 42.0  MCV 105.3*  PLT 167   Basic Metabolic Panel  Recent Labs Lab 05/03/13 0653  NA 140  K 4.4  CL 98  CO2 26  GLUCOSE 31*  BUN 26*  CREATININE 9.48*  CALCIUM 8.0*    Exam  Blood pressure 187/86, pulse 100, resp. rate 25, last menstrual period 04/12/2013, SpO2 100.00%.  gen: on bipap, alert, no distress  skin: no rash, cyanosis  heent: eomi, sclera anicteric, throat not seen  neck: ++ jvd  chest: diffuse insp and exp rales throughout all lung fields  cor: tachy, regular, no M or rub  abd: soft, nt, nd, no ascites   ext: 1+ edema bilat LE's pretibial, no joint effusion, no gangrene/ulcers  neuro: alert, ox3, nonfocal  access: R thigh AVG patent  Dialysis: TTS at [facility] 3:45hrs  F160   400/1.5   66.5kg   2/2.5 [place]  Heparin none   R thigh AVG Hectorol 1ug     Epo 13000     Venofer none Last labs: tsat 31% (nov) ferritin 1750 (july)  phos 4.0  pth 434 (sept) Coming off under edw by 1 kg and wt gains average 3.5-4 kg  Assessment/Plan: 1. Non-cardiac pulm edema / volume overload- on bipap, plan HD this am upstairs, max UF as BP tolerates. Trop negative, EKG no acute changes.  2. ESRD- missed hd yesterday, said Zenaida Niece did not come, usually compliant with all Rx's 3. Anemia/CKD- on epo at center , hold for now 4. MBD (metabolic bone disease)- cont binder, vit D 5. HTN/volume- reduce volume with HD, cont toprol-xl 6. Kidney/pancreas Tx- on prograf, still have residual panc fxn per [facility] md's 7. 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  8. Immunosuppression- still on low-dose immunosuppression.  Taper of prograf and pred being planned as outpt over next 6 months but for now keep doses as usual.    Vinson Moselle MD  pager [phone]    cell [phone]  05/03/2013, 8:16 AM

## 2013-05-03 NOTE — ED Notes (Signed)
Paged IV team 

## 2013-05-03 NOTE — Progress Notes (Signed)
Called by RN for patient vomiting. Upon arrival pt had vomited in mask but mask was off at this time, RN in room. Pt mouth suctioned, placed on 15 LPM NRB. Oxygen is 100% and RR 26. RT will leave pt on NRB for risk of aspiration and RT will continue to monitor patient.

## 2013-05-03 NOTE — Progress Notes (Signed)
ANTIBIOTIC CONSULT NOTE - INITIAL  Pharmacy Consult for Vancomycin/zosyn Indication: Empiric for fever and AMS  Allergies  Allergen Reactions  . Depakote [Divalproex Sodium] Other (See Comments)    Pt gets "delirious"  . Morphine And Related Nausea And Vomiting and Other (See Comments)    "makes me delirious"  . Penicillins Anaphylaxis  . Tramadol Nausea And Vomiting  . Vicodin [Hydrocodone-Acetaminophen] Itching and Rash    Patient Measurements: Weight: 149 lb 7.6 oz (67.8 kg)   Vital Signs: Temp: 100.6 F (38.1 C) (12/07 1543) Temp src: Oral (12/07 1543) BP: 136/73 mmHg (12/07 1543) Pulse Rate: 114 (12/07 1543) Intake/Output from previous day:   Intake/Output from this shift: Total I/O In: -  Out: 3655 [Other:3655]  Labs:  Recent Labs  05/03/13 0653  WBC 10.0  HGB 13.1  PLT 167  CREATININE 9.48*   The CrCl is unknown because both a height and weight (above a minimum accepted value) are required for this calculation. No results found for this basename: VANCOTROUGH, Leodis Binet, VANCORANDOM, GENTTROUGH, GENTPEAK, GENTRANDOM, TOBRATROUGH, TOBRAPEAK, TOBRARND, AMIKACINPEAK, AMIKACINTROU, AMIKACIN,  in the last 72 hours   Microbiology: Recent Results (from the past 720 hour(s))  MRSA PCR SCREENING     Status: None   Collection Time    04/06/13  6:51 AM      Result Value Range Status   MRSA by PCR NEGATIVE  NEGATIVE Final   Comment:            The GeneXpert MRSA Assay (FDA     approved for NASAL specimens     only), is one component of a     comprehensive MRSA colonization     surveillance program. It is not     intended to diagnose MRSA     infection nor to guide or     monitor treatment for     MRSA infections.    Medical History: Past Medical History  Diagnosis Date  . Hyperlipidemia   . Alopecia   . Obesity   . Iron deficiency   . ESRD (end stage renal disease)     S/p pancreatic and kidney transplant, but back on HD 2008, dialysis at [facility], [place], Sat  . RLS (restless legs syndrome)   . Injury of conjunctiva and corneal abrasion of right eye without foreign body   . Cellulitis   . 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 MMF stopped in November 2014.  After one month plan is to taper off tacrolimus (decrease from bid to qday for 1 week then reduce dose by 50% for one week then stop). Prednisone should then be the last to come   . Anemia   . Blood transfusion 2004    "when I had my transplant"  . Migraine   . Hyperparathyroidism, secondary   . Diabetic retinopathy   . Hypertension     takes Metoprolol and Exforge daily  . Depression     takes Klonopin nightly  . Diabetes mellitus type 1     Pt states diagnosed at age 45 with prior episodes of DKA. S/p pancreatic transplant   . GERD (gastroesophageal reflux disease)     takes Protonix daily  . Bronchitis   . Migraine   . Asthma   . Emphysema of lung   . Angina  none in past 2 years.  . Stomach pain     Hx: of chronic  . Pneumonia 2012    "double"   Assessment: 42 YOF with ESRD, s/p kidney and pancreatic transplant, currently on HD-TTS, missed HD Saturday, found at home unresponsive, and with hypoglycemia corrected with D50 and glucagon. Now with fever, 100.6, pharmacy is consulted to start vancomycin and zosyn empirically. Received HD today.   Vancomycin 12/7 >> Zosyn 12/7 >>  12/7 blood cx x 2 -    Goal of Therapy:  Pre-HD vancomycin level 15-25 mcg/ml  Plan:  - Zosyn 2.25 g IV Q 8hrs - Vancomycin 1500 mg IV loading dose x 1 - Vancomycin 750 mg IV Q HD on TTS - f/u blood culture - f/u HD schedule and tolerance.  Bayard Hugger, PharmD, BCPS  Clinical Pharmacist  Pager: [phone]   05/03/2013,4:09 PM

## 2013-05-03 NOTE — ED Notes (Signed)
Pt actively vomiting in Bi-Pap machine, pulled mask off, pt coughing and vomiting up. Airway maintained. Placed on [place] until pt can stop vomiting.

## 2013-05-03 NOTE — ED Notes (Signed)
Pt was found by boyfriend not responsive.  Pt was found to be slightly responsive by EMS, placed on NRB at 15LPM 100% O2.

## 2013-05-03 NOTE — Procedures (Signed)
I was present at this dialysis session, have reviewed the session itself and made  appropriate changes   Vinson Moselle MD  pager [phone]    cell [phone]  05/03/2013, 1:58 PM

## 2013-05-03 NOTE — H&P (Addendum)
[facility]                                                                                    Patient Demographics  [name], is a 42 y.o. female  MRN: [id number]   DOB - 12-27-1970  Admit Date - 05/03/2013  Outpatient Primary MD for the patient is [name], [name], MD   With History of -  Past Medical History  Diagnosis Date  . Hyperlipidemia   . Alopecia   . Obesity   . Iron deficiency   . ESRD (end stage renal disease)     S/p pancreatic and kidney transplant, but back on HD 2008, dialysis at [facility], [place], Sat  . RLS (restless legs syndrome)   . Injury of conjunctiva and corneal abrasion of right eye without foreign body   . Cellulitis   . Immunosuppression 08/01/11    currently takes Prednisone and tacrolimus   . Anemia   . Blood transfusion 2004    "when I had my transplant"  . Migraine   . Hyperparathyroidism, secondary   . Diabetic retinopathy   . Hypertension     takes Metoprolol and Exforge daily  . Depression     takes Klonopin nightly  . Diabetes mellitus type 1     Pt states diagnosed at age 69 with prior episodes of DKA. S/p pancreatic transplant   . GERD (gastroesophageal reflux disease)     takes Protonix daily  . Bronchitis   . Migraine   . Asthma   . Emphysema of lung   . Angina     none in past 2 years.  . Stomach pain     Hx: of chronic  . Pneumonia 2012    "double"  . Immunosuppression 08/01/2011    Dr Marisue Humble spoke with [facility] transplant regarding immunosuppression (Apr 08, 2013). 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 We have come to the decision to stop MMF now. In one month she can taper off of tacrolimus (I suggest going to qday from BID for 1 week, then reduce dose by 50% for one week, then stop). Prednisone should be the last to come off. I would not change dose at all currently and only in 3-6 months after the other two meds are stopped and  patient is stable.     . Immunosuppression 08/01/2011    Dr Marisue Humble spoke with [facility] transplant regarding immunosuppression (Apr 08, 2013). 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 We have come to the decision to stop MMF now. In one month she can taper off of tacrolimus (I suggest going to qday from BID for 1 week, then reduce dose by 50% for one week, then stop). Prednisone should be the last to come off. I would not change dose at all currently and only in 3-6 months after the other two meds are stopped and patient is stable.         Past Surgical History  Procedure Laterality Date  . 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 Thyroglobulin      x 7  . Av fistula placement      right upper arm  . Cholecystectomy  1995  .  hd graft placement/removal      "had 2 in my left upper arm"  . Eye surgery    . Retinopathy surgery    . Tooth extraction  10/10/11    X's two  . Insertion of dialysis catheter  12/08/2011    Procedure: INSERTION OF DIALYSIS CATHETER;  Surgeon: Chuck Hint, MD;  Location: [facility] OR;  Service: Vascular;  Laterality: Left;  . Av fistula placement  01/22/2012    Procedure: INSERTION OF ARTERIOVENOUS (AV) GORE-TEX GRAFT ARM;  Surgeon: Chuck Hint, MD;  Location: [facility] OR;  Service: Vascular;  Laterality: Left;  Ultrasound guided.  . Av fistula placement  03/14/2012    Procedure: INSERTION OF ARTERIOVENOUS (AV) GORE-TEX GRAFT THIGH;  Surgeon: Chuck Hint, MD;  Location: [facility] OR;  Service: Vascular;  Laterality: Right;  . False aneurysm repair Right 01/02/2013    Procedure: Excision of lymphocele in right thigh.;  Surgeon: Chuck Hint, MD;  Location: [facility] OR;  Service: Vascular;  Laterality: Right;  . Carpal tunnel release Right 03/02/2013    Procedure: CARPAL TUNNEL RELEASE;  Surgeon: Tami Ribas, MD;  Location: [facility];  Service:  Orthopedics;  Laterality: Right;  . Flexible sigmoidoscopy N/A 03/24/2013    Procedure: FLEXIBLE SIGMOIDOSCOPY;  Surgeon: Florencia Reasons, MD;  Location: [facility] ENDOSCOPY;  Service: Endoscopy;  Laterality: N/A;  . Revision of arteriovenous goretex graft Right 04/02/2013    Procedure: REPAIR OF PSEUDOANEURYSM OF ARTERIOVENOUS GORETEX GRAFT;  Surgeon: Chuck Hint, MD;  Location: [facility] OR;  Service: Vascular;  Laterality: Right;    in for   Chief Complaint  Patient presents with  . Altered Mental Status     HPI  [name]  is a 42 y.o. female, history of ESRD on Tuesday Thursday Saturday dialysis schedule, diabetes mellitus type 1, status post kidney- pancreatic transplant on chronic prednisone, GERD, migraine, secondary hyperparathyroidism, anemia of chronic disease, who missed her dialysis schedule on Saturday due to transport issues and apparently the transport company which picks her up from home did not show up, she was found this morning unresponsive. Her boyfriend then called 911 and patient was found to be short of breath and hypoglycemic.    She was then brought to the ER where she was diagnosed with acute respiratory failure due to fluid overload from missed dialysis and hyperglycemia. After receiving D50 she was placed on BiPAP, her mentation improved, she was seen by renal for urgent dialysis and I was requested to admit.     Review of Systems    In addition to the HPI above,   No Fever-chills, No Headache, No changes with Vision or hearing, No problems swallowing food or Liquids, No Chest pain, Cough , + ve Shortness of Breath, No Abdominal pain, No Nausea or Vommitting, Bowel movements are regular, No Blood in stool or Urine, No dysuria, No new skin rashes or bruises, No new joints pains-aches,  No new weakness, tingling, numbness in any extremity, No recent weight gain or loss, No polyuria, polydypsia or polyphagia, No significant Mental Stressors.  A full  10 point Review of Systems was done, except as stated above, all other Review of Systems were negative.   Social History History  Substance Use Topics  . Smoking status: Never Smoker   .  Smokeless tobacco: Never Used  . Alcohol Use: No      Family History Family History  Problem Relation Age of Onset  . Hypertension Mother   . Kidney disease Mother   . Diabetes Mother   . Hypertension Father   . Kidney disease Father   . Diabetes Father      Prior to Admission medications   Medication Sig Start Date End Date Taking? Authorizing Provider  albuterol (PROVENTIL HFA;VENTOLIN HFA) 108 (90 BASE) MCG/ACT inhaler Inhale 2 puffs into the lungs every 4 (four) hours as needed for wheezing or shortness of breath (as per home regimen). 02/20/13  Yes Esperanza Sheets, MD  calcium acetate (PHOSLO) 667 MG capsule Take 2,001-2,668 mg by mouth 3 (three) times daily with meals. Dosage depends on meal consumed 03/12/11  Yes Historical Provider, MD  clonazePAM (KLONOPIN) 0.5 MG tablet Take 1 mg by mouth at bedtime.    Yes Historical Provider, MD  EXFORGE 10-160 MG per tablet Take 1 tablet by mouth at bedtime.  06/15/12  Yes Historical Provider, MD  gabapentin (NEURONTIN) 100 MG capsule Take 100 mg by mouth daily.   Yes Historical Provider, MD  HYDROcodone-acetaminophen (NORCO) 5-325 MG per tablet Take 1 tablet by mouth every 6 (six) hours as needed. 04/02/13  Yes Samantha J Rhyne, PA-C  insulin glargine (LANTUS) 100 UNIT/ML injection Inject 0.05 mLs (5 Units total) into the skin daily. 04/11/13  Yes Clanford Cyndie Mull, MD  metoprolol succinate (TOPROL-XL) 100 MG 24 hr tablet Take 100 mg by mouth at bedtime. Take with or immediately following a meal.   Yes Historical Provider, MD  montelukast (SINGULAIR) 10 MG tablet Take 10 mg by mouth at bedtime.   Yes Historical Provider, MD  multivitamin (RENA-VIT) TABS tablet Take 1 tablet by mouth daily.   Yes Historical Provider, MD  Nutritional Supplements  (FEEDING SUPPLEMENT, NEPRO CARB STEADY,) LIQD Take 237 mLs by mouth daily. 03/27/13  Yes Kathlen Mody, MD  pantoprazole (PROTONIX) 40 MG tablet Take 1 tablet (40 mg total) by mouth at bedtime. 03/27/13  Yes Kathlen Mody, MD  predniSONE (DELTASONE) 5 MG tablet Take 5 mg by mouth daily.  01/21/11  Yes Historical Provider, MD  tacrolimus (PROGRAF) 1 MG capsule Take 1 mg by mouth 2 (two) times daily.   Yes Historical Provider, MD  zolpidem (AMBIEN CR) 12.5 MG CR tablet Take 12.5 mg by mouth at bedtime.   Yes Historical Provider, MD  glucose blood (ACCU-CHEK AVIVA) test strip Test BS 4x/day. Dx  251.2, 250.41 04/11/13   Clanford Cyndie Mull, MD  Lancets (ACCU-CHEK SOFT TOUCH) lancets Use as instructed 04/11/13   Clanford Cyndie Mull, MD    Allergies  Allergen Reactions  . Depakote [Divalproex Sodium] Other (See Comments)    Pt gets "delirious"  . Morphine And Related Nausea And Vomiting and Other (See Comments)    "makes me delirious"  . Penicillins Anaphylaxis  . Tramadol Nausea And Vomiting  . Vicodin [Hydrocodone-Acetaminophen] Itching and Rash    Physical Exam  Vitals  Blood pressure 187/86, pulse 101, resp. rate 28, last menstrual period 04/12/2013, SpO2 100.00%.   1. General Young African American female lying in bed in minimal shortness of breath wearing BiPAP but appears in no distress now.  2. Normal affect and insight, Not Suicidal or Homicidal, Awake Alert, Oriented X 3.  3. No F.N deficits, ALL C.Nerves Intact, Strength 5/5 all 4 extremities, Sensation intact all 4 extremities, Plantars down going.  4. Ears and Eyes  appear Normal, Conjunctivae clear, PERRLA. Moist Oral Mucosa.  5. Supple Neck, No JVD, No cervical lymphadenopathy appriciated, No Carotid Bruits.  6. Symmetrical Chest wall movement, Good air movement bilaterally, bilateral rales  7. RRR, No Gallops, Rubs or Murmurs, No Parasternal Heave.  8. Positive Bowel Sounds, Abdomen Soft, Non tender, No organomegaly  appriciated,No rebound -guarding or rigidity.  9.  No Cyanosis, Normal Skin Turgor, No Skin Rash or Bruise. 1+ edema  10. Good muscle tone,  joints appear normal , no effusions, Normal ROM.  11. No Palpable Lymph Nodes in Neck or Axillae     Data Review  CBC  Recent Labs Lab 05/03/13 0653  WBC 10.0  HGB 13.1  HCT 42.0  PLT 167  MCV 105.3*  MCH 32.8  MCHC 31.2  RDW 15.3  LYMPHSABS 1.1  MONOABS 0.4  EOSABS 0.1  BASOSABS 0.0   ------------------------------------------------------------------------------------------------------------------  Chemistries   Recent Labs Lab 05/03/13 0653  NA 140  K 4.4  CL 98  CO2 26  GLUCOSE 31*  BUN 26*  CREATININE 9.48*  CALCIUM 8.0*  AST 21  ALT 9  ALKPHOS 199*  BILITOT 0.5   ------------------------------------------------------------------------------------------------------------------ CrCl is unknown because both a height and weight (above a minimum accepted value) are required for this calculation. ------------------------------------------------------------------------------------------------------------------ No results found for this basename: TSH, T4TOTAL, FREET3, T3FREE, THYROIDAB,  in the last 72 hours   Coagulation profile No results found for this basename: INR, PROTIME,  in the last 168 hours ------------------------------------------------------------------------------------------------------------------- No results found for this basename: DDIMER,  in the last 72 hours -------------------------------------------------------------------------------------------------------------------  Cardiac Enzymes  Recent Labs Lab 05/03/13 0653  TROPONINI <0.30   ------------------------------------------------------------------------------------------------------------------ No components found with this basename: POCBNP,     ---------------------------------------------------------------------------------------------------------------  Urinalysis    Component Value Date/Time   COLORURINE YELLOW 09/07/2010 1517   APPEARANCEUR TURBID* 09/07/2010 1517   LABSPEC 1.016 09/07/2010 1517   PHURINE 7.0 09/07/2010 1517   GLUCOSEU >1000* 09/07/2010 1517   HGBUR LARGE* 09/07/2010 1517   BILIRUBINUR NEGATIVE 09/07/2010 1517   KETONESUR NEGATIVE 09/07/2010 1517   PROTEINUR >300* 09/07/2010 1517   UROBILINOGEN 0.2 09/07/2010 1517   NITRITE NEGATIVE 09/07/2010 1517   LEUKOCYTESUR LARGE* 09/07/2010 1517    ----------------------------------------------------------------------------------------------------------------  Imaging results:     Dg Chest Port 1 View  05/03/2013   CLINICAL DATA:  Shortness of breath.  EXAM: PORTABLE CHEST - 1 VIEW  COMPARISON:  Chest radiograph performed 04/06/2013  FINDINGS: There is new right-sided airspace opacification, concerning for pneumonia. Asymmetric pulmonary edema is considered less likely. Mild left basilar airspace opacity is seen. A small right pleural effusion is suspected. No pneumothorax is seen.  The cardiomediastinal silhouette is mildly enlarged. No acute osseous abnormalities are identified.  IMPRESSION: 1. New diffuse right-sided airspace opacification, concerning for pneumonia. Asymmetric pulmonary edema is considered less likely. Mild left basilar airspace opacity also seen. 2. Suspect small right pleural effusion. 3. Mild cardiomegaly.   Electronically Signed   By: Roanna Raider M.D.   On: 05/03/2013 06:52      My personal review of EKG: Rhythm NSR,   no Acute ST changes    Assessment & Plan   1. Decreased mental status and unresponsiveness due to combination of hypoxic acute respiratory failure and metabolic encephalopathy from hypoglycemia -   Her hypoxic respiratory failure secondary to fluid overload from missed dialysis on Saturday, she is doing much better on  BiPAP and her mentation has improved. Urgent dialysis is to be commenced renal has already  seen the patient. I anticipate BiPAP will come off as soon as dialysis is done. CXR CHF vs PNA clinicallly CHF for now - monitor.   Her hypoglycemia is due to her taking her long-acting insulin and missing dialysis which caused worsening of her baseline ESRD, recommendation of long-acting insulin. She received D50 and I have ordered IV glucagon in the ER. We will monitor CBGs closely. Hold long-acting insulin. Low-dose sliding scale as needed.   Addendum - paged at 4pm post dialysis patient is febrile, likely infilterate on admit was PNA + CHF - obtain cultures, ? Aspiration when unresponsive at home, HCAP ABX.   2. ESRD. Dialysis per renal, Tuesday Thursday Saturday schedule. We'll consult social work to assist with transportation needs.     3. Diabetes mellitus type 1. Check A1c, hold long-acting insulin for now. Low-dose sliding scale only.    4. Status post kidney-pancreatic transplant. Continue home transplant medications along with prednisone.     5. Hypertension. Poor control due to respiratory distress, IV hydralazine as needed ordered, also resume home medications now.     6. GERD continue PPI.     7. Secondary hyperparathyroidism. Continue renal medications unchanged.     8. Diabetic neuropathy. Neurontin continued.      DVT Prophylaxis Heparin    AM Labs Ordered, also please review Full Orders  Family Communication: Admission, patients condition and plan of care including tests being ordered have been discussed with the patient   who indicates understanding and agree with the plan and Code Status.  Code Status full  Likely DC to Home  Condition GUARDED   Time spent in minutes : 45    Takeya Marquis K M.D on 05/03/2013 at 8:44 AM  Between 7am to 7pm - Pager - [phone]  After 7pm go to [email or web address] - password [email or web address]  And look for the night coverage  person covering me after hours  [facility] Office  [phone]

## 2013-05-03 NOTE — ED Notes (Signed)
Pt stopped vomiting placed on NRM. RT at bedside.

## 2013-05-04 DIAGNOSIS — R7309 Other abnormal glucose: Secondary | ICD-10-CM

## 2013-05-04 DIAGNOSIS — R739 Hyperglycemia, unspecified: Secondary | ICD-10-CM | POA: Diagnosis not present

## 2013-05-04 DIAGNOSIS — J189 Pneumonia, unspecified organism: Secondary | ICD-10-CM

## 2013-05-04 LAB — CBC
HCT: 36.2 % (ref 36.0–46.0)
Hemoglobin: 11.3 g/dL — ABNORMAL LOW (ref 12.0–15.0)
Platelets: 106 10*3/uL — ABNORMAL LOW (ref 150–400)
RBC: 3.44 MIL/uL — ABNORMAL LOW (ref 3.87–5.11)
RDW: 15.6 % — ABNORMAL HIGH (ref 11.5–15.5)
WBC: 9.1 10*3/uL (ref 4.0–10.5)

## 2013-05-04 LAB — BASIC METABOLIC PANEL
Chloride: 87 mEq/L — ABNORMAL LOW (ref 96–112)
Creatinine, Ser: 5.77 mg/dL — ABNORMAL HIGH (ref 0.50–1.10)
GFR calc Af Amer: 10 mL/min — ABNORMAL LOW (ref >90)
GFR calc non Af Amer: 8 mL/min — ABNORMAL LOW (ref >90)
Potassium: 5.2 mEq/L — ABNORMAL HIGH (ref 3.5–5.1)
Sodium: 126 mEq/L — ABNORMAL LOW (ref 135–145)

## 2013-05-04 LAB — GLUCOSE, CAPILLARY
Glucose-Capillary: >600 mg/dL (ref 70–99)
Glucose-Capillary: 91 mg/dL (ref 70–99)
Glucose-Capillary: 446 mg/dL — ABNORMAL HIGH (ref 70–99)

## 2013-05-04 MED ORDER — HYDROMORPHONE HCL PF 1 MG/ML IJ SOLN
0.5000 mg | Freq: Three times a day (TID) | INTRAMUSCULAR | Status: DC | PRN
  Administered 2013-05-04 – 2013-05-06 (×5): 0.5 mg via INTRAVENOUS
  Filled 2013-05-04 (×5): qty 1

## 2013-05-04 MED ORDER — DOXERCALCIFEROL 4 MCG/2ML IV SOLN
1.0000 ug | INTRAVENOUS | Status: DC
  Administered 2013-05-05 – 2013-05-07 (×2): 1 ug via INTRAVENOUS
  Filled 2013-05-04 (×3): qty 2

## 2013-05-04 MED ORDER — INSULIN ASPART 100 UNIT/ML ~~LOC~~ SOLN
12.0000 [IU] | Freq: Once | SUBCUTANEOUS | Status: AC
  Administered 2013-05-04: 12 [IU] via SUBCUTANEOUS

## 2013-05-04 MED ORDER — INSULIN ASPART 100 UNIT/ML ~~LOC~~ SOLN
8.0000 [IU] | Freq: Once | SUBCUTANEOUS | Status: AC
  Administered 2013-05-04: 8 [IU] via SUBCUTANEOUS

## 2013-05-04 MED ORDER — INSULIN GLARGINE 100 UNIT/ML ~~LOC~~ SOLN
5.0000 [IU] | Freq: Every day | SUBCUTANEOUS | Status: DC
  Administered 2013-05-04 – 2013-05-05 (×2): 5 [IU] via SUBCUTANEOUS
  Filled 2013-05-04 (×2): qty 0.05

## 2013-05-04 NOTE — Progress Notes (Signed)
Lab called and stated CBG 624. Rechecked and over >600. MD notified and new orders given.

## 2013-05-04 NOTE — Progress Notes (Signed)
[facility] Progress Note  Subjective:   I don't know what's the matter with me. Breathing fine. No appetite. Abdominal pain at times.  Objective Filed Vitals:   05/03/13 1543 05/03/13 2100 05/04/13 0500 05/04/13 0815  BP: 136/73 97/55 106/66 110/67  Pulse: 114 110 74 77  Temp: 100.6 F (38.1 C) 99.3 F (37.4 C) 98.4 F (36.9 C) 98 F (36.7 C)  TempSrc: Oral Oral Oral Oral  Resp: 20 20 18 18   Weight:      SpO2: 96% 97% 97% 97%   Physical Exam General: NAD eating grapes waiting for grits to be warmed up Heart: RRR Lungs: no wheezes or rales Abdomen: soft Extremities: no LE edema Dialysis Access: right thigh graft patent  D[facility]: [facility]  3:45hrs F160 400/1.5 66.5kg 2/2.5 Bath Heparin none R thigh AVG  Hectorol 1ug Epo 13000 Venofer none  Last labs: tsat 31% (nov) ferritin 1750 (july) phos 4.0 pth 434 (sept)  Coming off under edw by 1 kg and wt gains average 3.5-4 kg  Assessment/Plan: 1. Pul edema +/- PNA - Tmax 100.6 net UF 3.6 yesterday - still above EDW but sats ok - above notes say she is leaving below edw - may need to lower; on Vanc and zosyn 2. ESRD - TTS  HD next on Tuesday s/p renal/pancreas tx with med taper - no change this adm - still on prograf/pred; low Na related to ^ glu this am 3. Anemia - Hgb 11.3 - no ESA given yet this adm - follow 4. Secondary hyperparathyroidism - 2.5 Ca bath - resume hectorol 1 and phoslo - Ca on the low side - follow 5. HTN/volume - continue same meds exforge equivalent and metoprolol 100 6. Nutrition - renal diet + multivit 7. DM - hyperglycemic this am - per primary 8. Colitis/ lower GIB - recent adm - HGb stable - no heparin HD 9. Thrombocytopenia - plts dropped to 106 K - follow  Sheffield Slider, PA-C [facility] Beeper [phone] 05/04/2013,8:36 AM  LOS: 1 day    Additional Objective Labs: Basic Metabolic Panel:  Recent Labs Lab 05/03/13 0653 05/04/13 0555  NA 140 126*  K 4.4 5.2*   CL 98 87*  CO2 26 27  GLUCOSE 31* 624*  BUN 26* 27*  CREATININE 9.48* 5.77*  CALCIUM 8.0* 7.3*   Liver Function Tests:  Recent Labs Lab 05/03/13 0653  AST 21  ALT 9  ALKPHOS 199*  BILITOT 0.5  PROT 7.7  ALBUMIN 3.5   CBC:  Recent Labs Lab 05/03/13 0653 05/04/13 0555  WBC 10.0 9.1  NEUTROABS 8.5*  --   HGB 13.1 11.3*  HCT 42.0 36.2  MCV 105.3* 105.2*  PLT 167 106*  Cardiac Enzymes:  Recent Labs Lab 05/03/13 0653  TROPONINI <0.30   CBG:  Recent Labs Lab 05/03/13 0814 05/03/13 0956 05/03/13 1643 05/03/13 2101 05/04/13 0732  GLUCAP 159* 236* 150* 141* >600*  Studies/Results: Dg Chest Port 1 View  05/03/2013   CLINICAL DATA:  Fever.  Migraine headaches.  EXAM: PORTABLE CHEST - 1 VIEW  COMPARISON:  Earlier today.  FINDINGS: The cardiac silhouette remains mildly enlarged. Less dense airspace consolidation throughout the right lung with no significant change in mild airspace consolidation in the left lung. No visible pleural fluid. Unremarkable bones.  IMPRESSION: Improving asymmetrical pulmonary edema or pneumonia with stable mild cardiomegaly.   Electronically Signed   By: Gordan Payment M.D.   On: 05/03/2013 21:01   Dg Chest  Port 1 View  05/03/2013   CLINICAL DATA:  Shortness of breath.  EXAM: PORTABLE CHEST - 1 VIEW  COMPARISON:  Chest radiograph performed 04/06/2013  FINDINGS: There is new right-sided airspace opacification, concerning for pneumonia. Asymmetric pulmonary edema is considered less likely. Mild left basilar airspace opacity is seen. A small right pleural effusion is suspected. No pneumothorax is seen.  The cardiomediastinal silhouette is mildly enlarged. No acute osseous abnormalities are identified.  IMPRESSION: 1. New diffuse right-sided airspace opacification, concerning for pneumonia. Asymmetric pulmonary edema is considered less likely. Mild left basilar airspace opacity also seen. 2. Suspect small right pleural effusion. 3. Mild cardiomegaly.    Electronically Signed   By: Roanna Raider M.D.   On: 05/03/2013 06:52   Medications:   . amLODipine  10 mg Oral Daily  . calcium acetate  2,001 mg Oral TID WC  . clonazePAM  1 mg Oral QHS  . feeding supplement (NEPRO CARB STEADY)  237 mL Oral Q24H  . gabapentin  100 mg Oral Daily  . heparin  5,000 Units Subcutaneous Q8H  . insulin aspart  0-9 Units Subcutaneous TID WC  . insulin glargine  5 Units Subcutaneous Daily  . irbesartan  150 mg Oral Daily  . metoprolol succinate  100 mg Oral QHS  . montelukast  10 mg Oral QHS  . multivitamin  1 tablet Oral QHS  . ondansetron  4 mg Intramuscular Once  . pantoprazole  40 mg Oral QHS  . piperacillin-tazobactam (ZOSYN)  IV  2.25 g Intravenous Q8H  . predniSONE  5 mg Oral Daily  . sodium chloride  3 mL Intravenous Q12H  . tacrolimus  1 mg Oral BID  . [START ON 05/05/2013] vancomycin  750 mg Intravenous Q T,Th,Sa-HD

## 2013-05-04 NOTE — Progress Notes (Signed)
I have personally seen and examined this patient and agree with the assessment/plan as outlined above by [facility] PA. Possibly DC later today. Winslow Ederer K.,MD 05/04/2013 10:41 AM

## 2013-05-04 NOTE — Progress Notes (Signed)
Chaplain to visit patient. Offered spiritual care. Prayed with patient and family member. Had a conversation concerning situation. Wanted a bible. Delivered bible to patient.  Chaplain Jones Skene

## 2013-05-04 NOTE — Progress Notes (Signed)
CBG:41 Treatment: 15 GM carbohydrate snack  Symptoms: Shaky  Follow-up CBG: Time:1700 CBG Result:91  Possible Reasons for Event: Unknown  Comments/MD notified:Dhungel

## 2013-05-04 NOTE — Progress Notes (Signed)
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 Initially she was placed on BiPAP and received D50 and was then taken for urgent dialysis for which her breathing improved. Patient febrile with possible infiltrate on CXR.  Also now hyperglycemic this morning.  Assessment/Plan:  Acute encephalopathy  due to combination of hypoxic acute respiratory failure and metabolic encephalopathy from hypoglycemia  -Hypoxic respiratory failure secondary to missed dialysis and volume overload. Symptoms improved after urgent hemodialysis. -admission CXR  CHF versus pneumonia. -Encephalopathy resolved after D50 and placed on BiPAP.  Hypoglycemia in uncontrolled type 1 DM  Likely in the setting of missed dialysis and continuing to take long-acting insulin. Hypoglycemia resolved but now has elevated blood glucose this morning. Given 12 units aspart. Patient on sliding scale insulin. I would resume her home dose of Lantus 5 units daily. -Monitor blood glucose closely. -Her last A1c was 10.   Healthcare associated versus aspiration pneumonia Patient had infiltrate on chest x-ray with fever while on dialysis. Started on empiric vancomycin and Zosyn. Follow cultures   ESRD on HD . Dialysis per renal. SW consulted for transportation needs. -Continue PhosLo and hectoral     Status post kidney-pancreatic transplant.  Continue home transplant medications along with prednisone.    Hypertension. Poorly controlled upon  presentation. Now improved with dialysis. Resume all medications and when necessary hydralazine  . GERD  continue PPI.   Migraine headaches Resume home medications.  DVT Prophylaxis : Subcutaneous Heparin   Diet: Diabetic  Code Status: Full code Family Communication: None at bedside  Disposition Plan: Home likely in one to 2 days if blood glucose remained stable   Consultants:  Renal  Procedures:  Hemodialysis  Antibiotics:  IV vancomycin and Zosyn (12/ 7--)  HPI/Subjective: Admission H&P reviewed. Patient complaining of headache. Blood glucose this morning was elevated to 600s. Also complains of right upper back pain on deep inspiration  Objective: Filed Vitals:   05/04/13 0815  BP: 110/67  Pulse: 77  Temp: 98 F (36.7 C)  Resp: 18    Intake/Output Summary (Last 24 hours) at 05/04/13 1205 Last data filed at 05/04/13 0800  Gross per 24 hour  Intake    790 ml  Output   3655 ml  Net  -2865 ml   Filed Weights   05/03/13 1015 05/03/13 1435  Weight: 71.2 kg (156 lb 15.5 oz) 67.8 kg (149 lb 7.6 oz)    Exam:   General:  Middle aged female in no acute distress  HEENT: No pallor, no icterus, moist oral mucosa  Chest: Right lower lobe crackles, no rhonchi or wheeze  CVS: Normal S1-S2, no murmurs rub or gallop  Abdomen: Soft, nontender, nondistended, bowel sounds present  Extremities: Warm, no edema  CNS: AAO x3    Data Reviewed: Basic Metabolic Panel:  Recent Labs Lab 05/03/13 0653 05/04/13 0555  NA 140 126*  K 4.4 5.2*  CL 98 87*  CO2 26 27  GLUCOSE 31* 624*  BUN 26* 27*  CREATININE 9.48* 5.77*  CALCIUM 8.0* 7.3*   Liver Function Tests:  Recent Labs Lab 05/03/13 0653  AST 21  ALT 9  ALKPHOS 199*  BILITOT 0.5  PROT 7.7  ALBUMIN 3.5   No results found for this basename: LIPASE, AMYLASE,  in the last 168 hours No results found for this basename: AMMONIA,  in the last 168 hours CBC:  Recent Labs Lab 05/03/13 0653  05/04/13 0555  WBC 10.0 9.1  NEUTROABS 8.5*  --   HGB 13.1 11.3*  HCT 42.0 36.2  MCV 105.3* 105.2*  PLT 167 106*   Cardiac Enzymes:  Recent Labs Lab 05/03/13 0653  TROPONINI <0.30   BNP (last 3 results)  Recent Labs  05/03/13 0653  PROBNP 33341.0*   CBG:  Recent Labs Lab 05/03/13 0814 05/03/13 0956 05/03/13 1643 05/03/13 2101 05/04/13 0732  GLUCAP 159* 236* 150* 141* >600*    No results found for this or any previous visit (from the past 240 hour(s)).   Studies: Dg Chest Port 1 View  05/03/2013   CLINICAL DATA:  Fever.  Migraine headaches.  EXAM: PORTABLE CHEST - 1 VIEW  COMPARISON:  Earlier today.  FINDINGS: The cardiac silhouette remains mildly enlarged. Less dense airspace consolidation throughout the right lung with no significant change in mild airspace consolidation in the left lung. No visible pleural fluid. Unremarkable bones.  IMPRESSION: Improving asymmetrical pulmonary edema or pneumonia with stable mild cardiomegaly.   Electronically Signed   By: Gordan Payment M.D.   On: 05/03/2013 21:01   Dg Chest Port 1 View  05/03/2013   CLINICAL DATA:  Shortness of breath.  EXAM: PORTABLE CHEST - 1 VIEW  COMPARISON:  Chest radiograph performed 04/06/2013  FINDINGS: There is new right-sided airspace opacification, concerning for pneumonia. Asymmetric pulmonary edema is considered less likely. Mild left basilar airspace opacity is seen. A small right pleural effusion is suspected. No pneumothorax is seen.  The cardiomediastinal silhouette is mildly enlarged. No acute osseous abnormalities are identified.  IMPRESSION: 1. New diffuse right-sided airspace opacification, concerning for pneumonia. Asymmetric pulmonary edema is considered less likely. Mild left basilar airspace opacity also seen. 2. Suspect small right pleural effusion. 3. Mild cardiomegaly.   Electronically Signed   By: Roanna Raider M.D.   On: 05/03/2013 06:52    Scheduled Meds: . amLODipine  10 mg Oral Daily   . calcium acetate  2,001 mg Oral TID WC  . clonazePAM  1 mg Oral QHS  . [START ON 05/05/2013] doxercalciferol  1 mcg Intravenous Q T,Th,Sa-HD  . feeding supplement (NEPRO CARB STEADY)  237 mL Oral Q24H  . gabapentin  100 mg Oral Daily  . heparin  5,000 Units Subcutaneous Q8H  . insulin aspart  0-9 Units Subcutaneous TID WC  . insulin glargine  5 Units Subcutaneous Daily  . irbesartan  150 mg Oral Daily  . metoprolol succinate  100 mg Oral QHS  . montelukast  10 mg Oral QHS  . multivitamin  1 tablet Oral QHS  . ondansetron  4 mg Intramuscular Once  . pantoprazole  40 mg Oral QHS  . piperacillin-tazobactam (ZOSYN)  IV  2.25 g Intravenous Q8H  . predniSONE  5 mg Oral Daily  . sodium chloride  3 mL Intravenous Q12H  . tacrolimus  1 mg Oral BID  . [START ON 05/05/2013] vancomycin  750 mg Intravenous Q T,Th,Sa-HD   Continuous Infusions:     Time spent: 35 minutes    Jannie Doyle  [facility] Pager [phone]. If 7PM-7AM, please contact night-coverage at [email or web address], password [facility] 05/04/2013, 12:05 PM  LOS: 1 day

## 2013-05-05 ENCOUNTER — Encounter: Payer: Self-pay | Admitting: Vascular Surgery

## 2013-05-05 ENCOUNTER — Ambulatory Visit (HOSPITAL_COMMUNITY): Payer: Medicare Other

## 2013-05-05 LAB — CBC
HCT: 30.5 % — ABNORMAL LOW (ref 36.0–46.0)
Hemoglobin: 9.5 g/dL — ABNORMAL LOW (ref 12.0–15.0)
MCH: 32 pg (ref 26.0–34.0)
MCHC: 31.1 g/dL (ref 30.0–36.0)
MCV: 102.7 fL — ABNORMAL HIGH (ref 78.0–100.0)
Platelets: 111 10*3/uL — ABNORMAL LOW (ref 150–400)
RBC: 2.97 MIL/uL — ABNORMAL LOW (ref 3.87–5.11)
RDW: 15 % (ref 11.5–15.5)
WBC: 8.6 10*3/uL (ref 4.0–10.5)

## 2013-05-05 LAB — RENAL FUNCTION PANEL
Albumin: 2.5 g/dL — ABNORMAL LOW (ref 3.5–5.2)
BUN: 47 mg/dL — ABNORMAL HIGH (ref 6–23)
CO2: 23 mEq/L (ref 19–32)
Calcium: 7.7 mg/dL — ABNORMAL LOW (ref 8.4–10.5)
Chloride: 89 mEq/L — ABNORMAL LOW (ref 96–112)
Creatinine, Ser: 7.71 mg/dL — ABNORMAL HIGH (ref 0.50–1.10)
GFR calc Af Amer: 7 mL/min — ABNORMAL LOW (ref >90)
GFR calc non Af Amer: 6 mL/min — ABNORMAL LOW (ref >90)
Glucose, Bld: 198 mg/dL — ABNORMAL HIGH (ref 70–99)
Phosphorus: 1.7 mg/dL — ABNORMAL LOW (ref 2.3–4.6)
Potassium: 4 mEq/L (ref 3.5–5.1)
Sodium: 129 mEq/L — ABNORMAL LOW (ref 135–145)

## 2013-05-05 LAB — GLUCOSE, CAPILLARY
Glucose-Capillary: 196 mg/dL — ABNORMAL HIGH (ref 70–99)
Glucose-Capillary: 210 mg/dL — ABNORMAL HIGH (ref 70–99)

## 2013-05-05 MED ORDER — INSULIN ASPART 100 UNIT/ML ~~LOC~~ SOLN
8.0000 [IU] | Freq: Three times a day (TID) | SUBCUTANEOUS | Status: DC
  Administered 2013-05-05: 8 [IU] via SUBCUTANEOUS

## 2013-05-05 MED ORDER — DIPHENHYDRAMINE HCL 50 MG/ML IJ SOLN
25.0000 mg | Freq: Once | INTRAMUSCULAR | Status: AC
  Administered 2013-05-05: 14:00:00 via INTRAVENOUS

## 2013-05-05 MED ORDER — INSULIN GLARGINE 100 UNIT/ML ~~LOC~~ SOLN
10.0000 [IU] | Freq: Every day | SUBCUTANEOUS | Status: DC
Start: 2013-05-06 — End: 2013-05-06
  Filled 2013-05-05: qty 0.1

## 2013-05-05 MED ORDER — METOCLOPRAMIDE HCL 5 MG/ML IJ SOLN
10.0000 mg | Freq: Once | INTRAMUSCULAR | Status: AC
  Administered 2013-05-05: 10 mg via INTRAVENOUS
  Filled 2013-05-05: qty 2

## 2013-05-05 MED ORDER — INSULIN ASPART 100 UNIT/ML ~~LOC~~ SOLN
4.0000 [IU] | Freq: Three times a day (TID) | SUBCUTANEOUS | Status: DC
  Administered 2013-05-05: 4 [IU] via SUBCUTANEOUS

## 2013-05-05 MED ORDER — KETOROLAC TROMETHAMINE 30 MG/ML IJ SOLN
30.0000 mg | Freq: Once | INTRAMUSCULAR | Status: AC
  Administered 2013-05-05: 30 mg via INTRAVENOUS
  Filled 2013-05-05: qty 1

## 2013-05-05 MED ORDER — DOXERCALCIFEROL 4 MCG/2ML IV SOLN
INTRAVENOUS | Status: AC
  Administered 2013-05-05: 1 ug via INTRAVENOUS
  Filled 2013-05-05: qty 2

## 2013-05-05 MED ORDER — KETOROLAC TROMETHAMINE 30 MG/ML IJ SOLN: 30.0000 mg | Freq: Once | INTRAMUSCULAR | Status: DC

## 2013-05-05 MED ORDER — ZOLPIDEM TARTRATE 5 MG PO TABS
5.0000 mg | ORAL_TABLET | Freq: Once | ORAL | Status: AC
  Administered 2013-05-05: 5 mg via ORAL
  Filled 2013-05-05: qty 1

## 2013-05-05 MED ORDER — DIPHENHYDRAMINE HCL 50 MG/ML IJ SOLN
25.0000 mg | Freq: Once | INTRAMUSCULAR | Status: AC
  Administered 2013-05-05: 25 mg via INTRAVENOUS
  Filled 2013-05-05: qty 1

## 2013-05-05 MED ORDER — DIPHENHYDRAMINE HCL 50 MG/ML IJ SOLN
INTRAMUSCULAR | Status: AC
  Filled 2013-05-05: qty 1

## 2013-05-05 NOTE — Procedures (Signed)
Patient seen on Hemodialysis. QB 400, UF goal 4.5L Treatment adjusted as needed.  Zetta Bills MD [facility]. Office # [phone] Pager # [phone] 12:45 PM

## 2013-05-05 NOTE — Progress Notes (Signed)
[facility] PROGRESS NOTE  [name] NWG:[id number] DOB: 07/29/1970 DOA: 05/03/2013 PCP: [name], MD  Brief narrative  42 year old female with history of incision and disease status post renal transplant now on HD ( Tuesday, Thursday and Saturday), uncontrolled type 1diabetes mellitus, hypertension, hyperlipidemia, anemia, depression, COPD with recurrent hospitalization several times with issues of uncontrolled DM presented with acute respiratory failure due to fluid overload from missing dialysis and hypoglycemia. Initially she was placed on BiPAP and received D50 and was then taken for urgent dialysis for which her breathing improved. Patient febrile with possible infiltrate on CXR. Also now with hyperglycemic .  Assessment/Plan:  Acute encephalopathy  due to combination of hypoxic acute respiratory failure and metabolic encephalopathy from hypoglycemia  -Hypoxic respiratory failure secondary to missed dialysis and volume overload. Symptoms improved after urgent hemodialysis.  -admission CXR CHF versus pneumonia.  -Encephalopathy resolved after D50 and placed on BiPAP.  -patient also reports that her aunt noticed her to have seizure like activity during the initial episode. This has happened to her once before last month. Could be hypoglycemia seizure. Will obtain EEG.  Hypoglycemia in uncontrolled type 1 DM  Likely in the setting of missed dialysis and continuing to take long-acting insulin. Hypoglycemia resolved but now has elevated blood glucose. Received short acting insulin which made her fsg to drop later in the afternoon.  - agree with diabetic coordinator recommendation.i will place her on lantus 10 units in am and reduce novolog with meals to 4 units tid or further lower. -Her last A1c was 10.   Healthcare associated versus aspiration pneumonia  Patient had infiltrate on chest x-ray with fever while on dialysis. Started on empiric vancomycin and Zosyn.  Follow cultures   ESRD on HD  . Dialysis per renal. SW consulted for transportation needs.  -Continue PhosLo and hectoral   Status post kidney-pancreatic transplant.  Continue home transplant medications along with prednisone.   Hypertension.  Poorly controlled upon presentation. Now improved with dialysis. Resume all medications and when necessary hydralazine   . GERD  continue PPI.   Migraine headaches  Resume home medications.   DVT Prophylaxis : Subcutaneous Heparin  Diet: Diabetic   Code Status: Full code   Family Communication: None at bedside   Disposition Plan: Home  Once blood glucose remains stable   Consultants:  Renal  Procedures:  Hemodialysis  Antibiotics:  IV vancomycin and Zosyn (12/ 7--)  HPI/Subjective:  Patient hypoglycemic yesterday afternoon after receiving short acting insulin for elevated fsg in am. lantus was held last evening. Became hyperglycemic again this am.  Objective: Filed Vitals:   05/05/13 1430  BP: 101/52  Pulse: 70  Temp:   Resp: 16    Intake/Output Summary (Last 24 hours) at 05/05/13 1438 Last data filed at 05/05/13 0900  Gross per 24 hour  Intake    460 ml  Output      0 ml  Net    460 ml   Filed Weights   05/03/13 1435 05/04/13 2153 05/05/13 1115  Weight: 67.8 kg (149 lb 7.6 oz) 68.992 kg (152 lb 1.6 oz) 68.1 kg (150 lb 2.1 oz)    Exam:  General: Middle aged female in no acute distress  HEENT: No pallor, no icterus, moist oral mucosa  Chest: Right lower lobe crackles, no rhonchi or wheeze  CVS: Normal S1-S2, no murmurs rub or gallop  Abdomen: Soft, nontender, nondistended, bowel sounds present  Extremities: Warm, no edema  CNS: AAO x3  Data Reviewed: Basic Metabolic Panel:  Recent Labs Lab 05/03/13 0653 05/04/13 0555 05/05/13 1145  NA 140 126* 129*  K 4.4 5.2* 4.0  CL 98 87* 89*  CO2 26 27 23   GLUCOSE 31* 624* 198*  BUN 26* 27* 47*  CREATININE 9.48* 5.77* 7.71*  CALCIUM 8.0* 7.3* 7.7*  PHOS   --   --  1.7*   Liver Function Tests:  Recent Labs Lab 05/03/13 0653 05/05/13 1145  AST 21  --   ALT 9  --   ALKPHOS 199*  --   BILITOT 0.5  --   PROT 7.7  --   ALBUMIN 3.5 2.5*   No results found for this basename: LIPASE, AMYLASE,  in the last 168 hours No results found for this basename: AMMONIA,  in the last 168 hours CBC:  Recent Labs Lab 05/03/13 0653 05/04/13 0555 05/05/13 1145  WBC 10.0 9.1 8.6  NEUTROABS 8.5*  --   --   HGB 13.1 11.3* 9.5*  HCT 42.0 36.2 30.5*  MCV 105.3* 105.2* 102.7*  PLT 167 106* 111*   Cardiac Enzymes:  Recent Labs Lab 05/03/13 0653  TROPONINI <0.30   BNP (last 3 results)  Recent Labs  05/03/13 0653  PROBNP 33341.0*   CBG:  Recent Labs Lab 05/04/13 1133 05/04/13 1633 05/04/13 1735 05/04/13 2140 05/05/13 0752  GLUCAP 363* 41* 91 446* 428*    Recent Results (from the past 240 hour(s))  CULTURE, BLOOD (ROUTINE X 2)     Status: None   Collection Time    05/03/13  2:35 PM      Result Value Range Status   Specimen Description BLOOD HEMODIALYSIS LINE   Final   Special Requests BOTTLES DRAWN AEROBIC AND ANAEROBIC 10CC   Final   Culture  Setup Time     Final   Value: 05/04/2013 00:10     Performed at [facility]   Culture     Final   Value:        BLOOD CULTURE RECEIVED NO GROWTH TO DATE CULTURE WILL BE HELD FOR 5 DAYS BEFORE ISSUING A FINAL NEGATIVE REPORT     Performed at [facility]   Report Status PENDING   Incomplete  CULTURE, BLOOD (ROUTINE X 2)     Status: None   Collection Time    05/03/13  2:50 PM      Result Value Range Status   Specimen Description BLOOD HEMODIALYSIS LINE   Final   Special Requests BOTTLES DRAWN AEROBIC AND ANAEROBIC 10CC   Final   Culture  Setup Time     Final   Value: 05/04/2013 00:10     Performed at [facility]   Culture     Final   Value:        BLOOD CULTURE RECEIVED NO GROWTH TO DATE CULTURE WILL BE HELD FOR 5 DAYS BEFORE ISSUING A FINAL NEGATIVE REPORT      Performed at [facility]   Report Status PENDING   Incomplete     Studies: Dg Chest Port 1 View  05/03/2013   CLINICAL DATA:  Fever.  Migraine headaches.  EXAM: PORTABLE CHEST - 1 VIEW  COMPARISON:  Earlier today.  FINDINGS: The cardiac silhouette remains mildly enlarged. Less dense airspace consolidation throughout the right lung with no significant change in mild airspace consolidation in the left lung. No visible pleural fluid. Unremarkable bones.  IMPRESSION: Improving asymmetrical pulmonary edema or pneumonia with stable mild cardiomegaly.  Electronically Signed   By: Gordan Payment M.D.   On: 05/03/2013 21:01    Scheduled Meds: . amLODipine  10 mg Oral Daily  . calcium acetate  2,001 mg Oral TID WC  . clonazePAM  1 mg Oral QHS  . doxercalciferol  1 mcg Intravenous Q T,Th,Sa-HD  . feeding supplement (NEPRO CARB STEADY)  237 mL Oral Q24H  . gabapentin  100 mg Oral Daily  . heparin  5,000 Units Subcutaneous Q8H  . insulin aspart  0-9 Units Subcutaneous TID WC  . insulin aspart  8 Units Subcutaneous TID WC  . insulin glargine  5 Units Subcutaneous Daily  . irbesartan  150 mg Oral Daily  . metoprolol succinate  100 mg Oral QHS  . montelukast  10 mg Oral QHS  . multivitamin  1 tablet Oral QHS  . ondansetron  4 mg Intramuscular Once  . pantoprazole  40 mg Oral QHS  . piperacillin-tazobactam (ZOSYN)  IV  2.25 g Intravenous Q8H  . predniSONE  5 mg Oral Daily  . sodium chloride  3 mL Intravenous Q12H  . tacrolimus  1 mg Oral BID  . vancomycin  750 mg Intravenous Q T,Th,Sa-HD   Continuous Infusions:     Time spent: 25 minutes    Katlin Ciszewski  [facility] Pager [phone]. If 7PM-7AM, please contact night-coverage at [email or web address], password [facility] 05/05/2013, 2:38 PM  LOS: 2 days

## 2013-05-05 NOTE — Progress Notes (Signed)
[facility] Progress Note  Subjective:   Reports diarrheal stool x 4 today. HAsn't eating breakfast yet  Objective Filed Vitals:   05/04/13 1415 05/04/13 1842 05/04/13 2153 05/05/13 0448  BP: 122/54 114/70 105/59 105/60  Pulse: 81 73 72 62  Temp: 98.5 F (36.9 C) 98.3 F (36.8 C) 97.9 F (36.6 C) 97.8 F (36.6 C)  TempSrc: Oral Oral Oral Oral  Resp: 18 18 18 18   Weight:   68.992 kg (152 lb 1.6 oz)   SpO2: 98% 98% 97% 94%   Physical Exam General: NAD Heart: RRR Lungs: crackles right base Abdomen: soft no sig tenderness Extremities: no LE edema Dialysis Access: right thigh graft   D[facility]: [facility]  3:45hrs F160 400/1.5 66.5kg 2/2.5 Bath Heparin none R thigh AVG  Hectorol 1ug Epo 13000 Venofer none  Last labs: tsat 31% (nov) ferritin 1750 (july) phos 4.0 pth 434 (sept)  Coming off under edw by 1 kg and wt gains average 3.5-4 kg  Assessment/Plan:  1. Pul edema +/- PNA - afebrile 24 hr  l above EDW but sats ok - outpt  notes say she is leaving below edw - may need to lower- eval on HD today; on empiric Vanc and zosyn  2. ESRD - TTS HD today s/p renal/pancreas tx with med taper - no change this adm - still on prograf/pred; repeat labs pre HD today 3. Anemia - Hgb 11.3 - no ESA given yet this adm - follow CBC today - can resume if less than 11 4. Secondary hyperparathyroidism - 2.5 Ca bath - resume hectorol 1 and phoslo - Ca on the low side - repeat pre HD today 5. HTN/volume - continue same meds exforge equivalent and metoprolol 100  controlled 6. Nutrition - renal diet + multivit  7. DM - hyperglycemic this am - per primary BS labile; suspect intake is inconsistent 8. Colitis/ lower GIB - recent adm - HGb stable - no heparin HD  9. Thrombocytopenia - plts dropped to 106 K - follow - repeat CBC pre HD  [name] Slider, PA-C [facility] Beeper [phone] 05/05/2013,8:25 AM  LOS: 2 days    Additional Objective Labs: Basic Metabolic  Panel:  Recent Labs Lab 05/03/13 0653 05/04/13 0555  NA 140 126*  K 4.4 5.2*  CL 98 87*  CO2 26 27  GLUCOSE 31* 624*  BUN 26* 27*  CREATININE 9.48* 5.77*  CALCIUM 8.0* 7.3*   Liver Function Tests:  Recent Labs Lab 05/03/13 0653  AST 21  ALT 9  ALKPHOS 199*  BILITOT 0.5  PROT 7.7  ALBUMIN 3.5   CBC:  Recent Labs Lab 05/03/13 0653 05/04/13 0555  WBC 10.0 9.1  NEUTROABS 8.5*  --   HGB 13.1 11.3*  HCT 42.0 36.2  MCV 105.3* 105.2*  PLT 167 106*   Blood Culture    Component Value Date/Time   SDES BLOOD HEMODIALYSIS LINE 05/03/2013 1450   SPECREQUEST BOTTLES DRAWN AEROBIC AND ANAEROBIC 10CC 05/03/2013 1450   CULT  Value:        BLOOD CULTURE RECEIVED NO GROWTH TO DATE CULTURE WILL BE HELD FOR 5 DAYS BEFORE ISSUING A FINAL NEGATIVE REPORT Performed at [facility] 05/03/2013 1450   REPTSTATUS PENDING 05/03/2013 1450    Cardiac Enzymes:  Recent Labs Lab 05/03/13 0653  TROPONINI <0.30   CBG:  Recent Labs Lab 05/04/13 1133 05/04/13 1633 05/04/13 1735 05/04/13 2140 05/05/13 0752  GLUCAP 363* 41* 91 446* 428*  Medications:   . amLODipine  10 mg Oral Daily  . calcium acetate  2,001 mg Oral TID WC  . clonazePAM  1 mg Oral QHS  . doxercalciferol  1 mcg Intravenous Q T,Th,Sa-HD  . feeding supplement (NEPRO CARB STEADY)  237 mL Oral Q24H  . gabapentin  100 mg Oral Daily  . heparin  5,000 Units Subcutaneous Q8H  . insulin aspart  0-9 Units Subcutaneous TID WC  . insulin aspart  8 Units Subcutaneous TID WC  . insulin glargine  5 Units Subcutaneous Daily  . irbesartan  150 mg Oral Daily  . metoprolol succinate  100 mg Oral QHS  . montelukast  10 mg Oral QHS  . multivitamin  1 tablet Oral QHS  . ondansetron  4 mg Intramuscular Once  . pantoprazole  40 mg Oral QHS  . piperacillin-tazobactam (ZOSYN)  IV  2.25 g Intravenous Q8H  . predniSONE  5 mg Oral Daily  . sodium chloride  3 mL Intravenous Q12H  . tacrolimus  1 mg Oral BID  . vancomycin  750  mg Intravenous Q T,Th,Sa-HD

## 2013-05-05 NOTE — Progress Notes (Signed)
I have personally seen and examined this patient and agree with the assessment/plan as outlined above by [facility] PA. Ms.Faughn complains of back pain ans nausea this morning. Had some diarrhea earlier.  Torin Modica K.,MD 05/05/2013 10:34 AM

## 2013-05-05 NOTE — Progress Notes (Signed)
Inpatient Diabetes Program Recommendations  AACE/ADA: New Consensus Statement on Inpatient Glycemic Control (2013)  Target Ranges:  Prepandial:   less than 140 mg/dL      Peak postprandial:   less than 180 mg/dL (1-2 hours)      Critically ill patients:  140 - 180 mg/dL     Results for [name], [name] (MRN [id number]) as of 05/05/2013 10:19  Ref. Range 05/04/2013 07:32 05/04/2013 11:33 05/04/2013 16:33 05/04/2013 17:35 05/04/2013 21:40  Glucose-Capillary Latest Range: 70-99 mg/dL >409 (HH) 811 (H) 41 (LL) 91 446 (H)    Results for [name], [name] (MRN [id number]) as of 05/05/2013 10:19  Ref. Range 05/05/2013 07:52  Glucose-Capillary Latest Range: 70-99 mg/dL 213 (H)    **Patient with extremely labile CBGs.   12/07- No Lantus given 12/08- 7AM CBG >600 mg/dl- 12 units Novolog given 12/08- 12PM CBG 363 mg/dl- 9 units Novolog given 12/08- 4PM CBG 41 mg/dl- No Novolog given 08/65- 10PM CBG 446 mg/dl- 8 units Novolog given 12/09- 8AM CBG 428 mg/dl- 17 units Novolog given (9 units SSI + 8 units meal coverage)  **Patient does not fair well with extremely large doses of Novolog insulin secondary to her ESRD.  Patient tends to hold on to insulin longer and then winds up having severe hypoglycemia hours later.  Since patient did not get any Lantus insulin on 12/07 she woke up on 12/08 with severe hyperglycemia.  Was given 2 large doses of Novolog and then by supper time yesterday her CBG dropped to 41 mg/dl.  No Novolog given with her supper meal and then her CBG jumped to 446 mg/dl by bedtime.  This morning, CBG was 428 mg/dl.  Patient was given 17 units of Novolog (9 units SSI and 8 units meal coverage).  Will ask RNs to be vigilant for hypoglycemia.   **MD- Please consider the following in-hospital insulin adjustments:  1. Increase Lantus to 10 units daily in the AM (per records, patient was taking Lantus 7 units bid at home prior to admission on 04/06/13).  2. Decrease Novolog meal coverage to  Novolog 4 units tid with meals  3. Continue Sensitive SSI tid with meals   Will follow. [name] RN, MSN, CDE Diabetes Coordinator Inpatient Diabetes Program Team Pager: [phone] (8a-10p)

## 2013-05-06 ENCOUNTER — Inpatient Hospital Stay (HOSPITAL_COMMUNITY): Payer: Medicare Other

## 2013-05-06 ENCOUNTER — Encounter: Payer: Medicare Other | Admitting: Vascular Surgery

## 2013-05-06 DIAGNOSIS — G9340 Encephalopathy, unspecified: Secondary | ICD-10-CM | POA: Diagnosis present

## 2013-05-06 DIAGNOSIS — G43909 Migraine, unspecified, not intractable, without status migrainosus: Secondary | ICD-10-CM

## 2013-05-06 LAB — GLUCOSE, CAPILLARY
Glucose-Capillary: >600 mg/dL (ref 70–99)
Glucose-Capillary: >600 mg/dL (ref 70–99)
Glucose-Capillary: >600 mg/dL (ref 70–99)
Glucose-Capillary: 495 mg/dL — ABNORMAL HIGH (ref 70–99)
Glucose-Capillary: 119 mg/dL — ABNORMAL HIGH (ref 70–99)
Glucose-Capillary: 92 mg/dL (ref 70–99)
Glucose-Capillary: 34 mg/dL — CL (ref 70–99)
Glucose-Capillary: 213 mg/dL — ABNORMAL HIGH (ref 70–99)

## 2013-05-06 LAB — POCT I-STAT 3, ART BLOOD GAS (G3+)
Acid-base deficit: 1 mmol/L (ref 0.0–2.0)
Bicarbonate: 27.9 meq/L — ABNORMAL HIGH (ref 20.0–24.0)
O2 Saturation: 66 %
Patient temperature: 98.6
TCO2: 30 mmol/L (ref 0–100)
pCO2 arterial: 67.2 mmHg (ref 35.0–45.0)
pH, Arterial: 7.225 — ABNORMAL LOW (ref 7.350–7.450)
pO2, Arterial: 42 mmHg — ABNORMAL LOW (ref 80.0–100.0)

## 2013-05-06 LAB — BASIC METABOLIC PANEL
BUN: 32 mg/dL — ABNORMAL HIGH (ref 6–23)
BUN: 32 mg/dL — ABNORMAL HIGH (ref 6–23)
BUN: 32 mg/dL — ABNORMAL HIGH (ref 6–23)
CO2: 20 mEq/L (ref 19–32)
CO2: 15 mEq/L — ABNORMAL LOW (ref 19–32)
CO2: 24 mEq/L (ref 19–32)
Calcium: 7.1 mg/dL — ABNORMAL LOW (ref 8.4–10.5)
Calcium: 7.3 mg/dL — ABNORMAL LOW (ref 8.4–10.5)
Calcium: 8 mg/dL — ABNORMAL LOW (ref 8.4–10.5)
Chloride: 83 mEq/L — ABNORMAL LOW (ref 96–112)
Chloride: 85 mEq/L — ABNORMAL LOW (ref 96–112)
Creatinine, Ser: 5 mg/dL — ABNORMAL HIGH (ref 0.50–1.10)
Creatinine, Ser: 5.26 mg/dL — ABNORMAL HIGH (ref 0.50–1.10)
Creatinine, Ser: 5.17 mg/dL — ABNORMAL HIGH (ref 0.50–1.10)
Creatinine, Ser: 5.67 mg/dL — ABNORMAL HIGH (ref 0.50–1.10)
GFR calc Af Amer: 11 mL/min — ABNORMAL LOW (ref >90)
GFR calc non Af Amer: 10 mL/min — ABNORMAL LOW (ref >90)
GFR calc non Af Amer: 9 mL/min — ABNORMAL LOW (ref >90)
GFR calc non Af Amer: 9 mL/min — ABNORMAL LOW (ref >90)
Glucose, Bld: 710 mg/dL (ref 70–99)
Glucose, Bld: 42 mg/dL — CL (ref 70–99)
Potassium: 4.5 mEq/L (ref 3.5–5.1)
Sodium: 114 mEq/L — CL (ref 135–145)

## 2013-05-06 MED ORDER — INSULIN GLARGINE 100 UNIT/ML ~~LOC~~ SOLN
5.0000 [IU] | Freq: Two times a day (BID) | SUBCUTANEOUS | Status: DC
  Administered 2013-05-06 – 2013-05-10 (×8): 5 [IU] via SUBCUTANEOUS
  Filled 2013-05-06 (×10): qty 0.05

## 2013-05-06 MED ORDER — SUMATRIPTAN SUCCINATE 50 MG PO TABS
50.0000 mg | ORAL_TABLET | Freq: Two times a day (BID) | ORAL | Status: DC | PRN
  Administered 2013-05-08 – 2013-05-09 (×3): 50 mg via ORAL
  Filled 2013-05-06 (×4): qty 1

## 2013-05-06 MED ORDER — CALCIUM ACETATE 667 MG PO CAPS
667.0000 mg | ORAL_CAPSULE | Freq: Three times a day (TID) | ORAL | Status: DC
  Administered 2013-05-07 – 2013-05-10 (×8): 667 mg via ORAL
  Filled 2013-05-06 (×15): qty 1

## 2013-05-06 MED ORDER — DARBEPOETIN ALFA-POLYSORBATE 100 MCG/0.5ML IJ SOLN
100.0000 ug | INTRAMUSCULAR | Status: DC
  Administered 2013-05-07: 100 ug via INTRAVENOUS
  Filled 2013-05-06: qty 0.5

## 2013-05-06 MED ORDER — INSULIN GLARGINE 100 UNIT/ML ~~LOC~~ SOLN
6.0000 [IU] | Freq: Two times a day (BID) | SUBCUTANEOUS | Status: DC
  Administered 2013-05-06: 6 [IU] via SUBCUTANEOUS
  Filled 2013-05-06 (×2): qty 0.06

## 2013-05-06 MED ORDER — HYDROCODONE-ACETAMINOPHEN 5-325 MG PO TABS: 1.0000 | ORAL_TABLET | ORAL | Status: DC | PRN

## 2013-05-06 MED ORDER — SODIUM CHLORIDE 0.9 % IV SOLN
INTRAVENOUS | Status: DC
  Administered 2013-05-06: 12:00:00 via INTRAVENOUS

## 2013-05-06 MED ORDER — DEXTROSE 50 % IV SOLN: 25.0000 mL | INTRAVENOUS | Status: DC | PRN

## 2013-05-06 MED ORDER — SODIUM CHLORIDE 0.9 % IV SOLN
INTRAVENOUS | Status: DC
  Administered 2013-05-06: 5.4 [IU]/h via INTRAVENOUS
  Filled 2013-05-06: qty 1

## 2013-05-06 MED ORDER — DEXTROSE 50 % IV SOLN
INTRAVENOUS | Status: AC
  Administered 2013-05-06: 50 mL
  Filled 2013-05-06: qty 50

## 2013-05-06 MED ORDER — KETOROLAC TROMETHAMINE 30 MG/ML IJ SOLN
15.0000 mg | Freq: Four times a day (QID) | INTRAMUSCULAR | Status: DC | PRN
  Administered 2013-05-06: 15 mg via INTRAVENOUS
  Filled 2013-05-06: qty 1

## 2013-05-06 MED ORDER — DEXTROSE 50 % IV SOLN: 50.0000 mL | Freq: Once | INTRAVENOUS | Status: AC | PRN

## 2013-05-06 MED ORDER — HYDROMORPHONE HCL PF 1 MG/ML IJ SOLN
1.0000 mg | INTRAMUSCULAR | Status: DC | PRN
  Administered 2013-05-06 – 2013-05-07 (×6): 1 mg via INTRAVENOUS
  Filled 2013-05-06 (×6): qty 1

## 2013-05-06 MED ORDER — DIPHENHYDRAMINE HCL 25 MG PO CAPS
25.0000 mg | ORAL_CAPSULE | Freq: Four times a day (QID) | ORAL | Status: DC | PRN
  Administered 2013-05-06: 25 mg via ORAL
  Filled 2013-05-06: qty 1

## 2013-05-06 MED ORDER — DEXTROSE-NACL 5-0.45 % IV SOLN
INTRAVENOUS | Status: DC
  Administered 2013-05-06: 16:00:00 via INTRAVENOUS

## 2013-05-06 MED ORDER — INSULIN REGULAR BOLUS VIA INFUSION
0.0000 [IU] | Freq: Three times a day (TID) | INTRAVENOUS | Status: DC
  Filled 2013-05-06: qty 10

## 2013-05-06 NOTE — Progress Notes (Signed)
[facility] Progress Note  Subjective:   C/o right low back pain - thinks it is related to PNA; c/o not getting correct insulin at HS -normally takes 6 Lantus and SSI.  Novolog; vomited this am  Objective Filed Vitals:   05/05/13 1517 05/05/13 1525 05/05/13 2045 05/06/13 0511  BP: 95/61 94/51 109/65 117/73  Pulse: 67 70 75 75  Temp: 97.3 F (36.3 C)  99.1 F (37.3 C) 98.5 F (36.9 C)  TempSrc: Oral  Oral Oral  Resp: 13 14 16 16   Weight: 63.9 kg (140 lb 14 oz)  65.862 kg (145 lb 3.2 oz)   SpO2: 100%  93% 100%   Physical Exam General: irritable Heart: RRR Lungs: few crackles on right Back: right lower tenderness on palpation - more at waistline area Abdomen: soft + BS Extremities: no LE edema Dialysis Access: right thigh graft  Dialysis: [facility] at [facility]  3:45hrs F160 400/1.5 66.5kg 2/2.5 Bath Heparin none R thigh AVG  Hectorol 1ug Epo 13000 Venofer none  Last labs: tsat 31% (nov) ferritin 1750 (july) phos 4.0 pth 434 (sept)  Coming off under edw by 1 kg and wt gains average 3.5-4 kg   Assessment/Plan:  1. Pul edema +/- PNA - afebrile 24 hr sats ok on room air on empiric Vanc and zosyn  2. ESRD - TTS  s/p renal/pancreas tx with med taper - no change this adm - still on prograf/pred; repeat labs pre HD - lowering edw 3. Anemia - Hgb 11.3 - no ESA given yet this adm - Hgb down to 9.5 - resume ARanesp next HD 100 4. Secondary hyperparathyroidism - 2.5 Ca bath - resume hectorol 1 and phoslo - Ca on the low side - repeat pre HD  P low 1.7 decrease to 1  phoslo ac - intake here not like outpt diet; also has had issues with low P before 5. HTN/volume - continue same meds exforge equivalent and metoprolol 100 controlled; UF 3.8 yesterday - pre and post HD weights don't make sense so difficult to know true edw - will need lower EDW at d/c 6. Nutrition - renal diet + multivit  7. DM - hyperglycemic this am - per primary BS labile; see Diabetic coord note - may need to resume  second dose of Lantus 8. Colitis/ lower GIB - recent adm -  no heparin HD  9. Thrombocytopenia - plts dropped to 106 K - follow - repeat Tues up to 111K follow  Sheffield Slider, PA-C [facility] Beeper [phone] 05/06/2013,8:46 AM  LOS: 3 days    Additional Objective Labs: Basic Metabolic Panel:  Recent Labs Lab 05/03/13 0653 05/04/13 0555 05/05/13 1145  NA 140 126* 129*  K 4.4 5.2* 4.0  CL 98 87* 89*  CO2 26 27 23   GLUCOSE 31* 624* 198*  BUN 26* 27* 47*  CREATININE 9.48* 5.77* 7.71*  CALCIUM 8.0* 7.3* 7.7*  PHOS  --   --  1.7*   Liver Function Tests:  Recent Labs Lab 05/03/13 0653 05/05/13 1145  AST 21  --   ALT 9  --   ALKPHOS 199*  --   BILITOT 0.5  --   PROT 7.7  --   ALBUMIN 3.5 2.5*   CBC:  Recent Labs Lab 05/03/13 0653 05/04/13 0555 05/05/13 1145  WBC 10.0 9.1 8.6  NEUTROABS 8.5*  --   --   HGB 13.1 11.3* 9.5*  HCT 42.0 36.2 30.5*  MCV 105.3* 105.2* 102.7*  PLT  167 106* 111*   Blood Culture    Component Value Date/Time   SDES BLOOD HEMODIALYSIS LINE 05/03/2013 1450   SPECREQUEST BOTTLES DRAWN AEROBIC AND ANAEROBIC 10CC 05/03/2013 1450   CULT  Value:        BLOOD CULTURE RECEIVED NO GROWTH TO DATE CULTURE WILL BE HELD FOR 5 DAYS BEFORE ISSUING A FINAL NEGATIVE REPORT Performed at [facility] 05/03/2013 1450   REPTSTATUS PENDING 05/03/2013 1450    Cardiac Enzymes:  Recent Labs Lab 05/03/13 0653  TROPONINI <0.30   CBG:  Recent Labs Lab 05/04/13 2140 05/05/13 0752 05/05/13 1627 05/05/13 2040 05/06/13 0809  GLUCAP 446* 428* 196* 210* >600*  Medications:   . amLODipine  10 mg Oral Daily  . calcium acetate  2,001 mg Oral TID WC  . clonazePAM  1 mg Oral QHS  . doxercalciferol  1 mcg Intravenous Q T,Th,Sa-HD  . feeding supplement (NEPRO CARB STEADY)  237 mL Oral Q24H  . gabapentin  100 mg Oral Daily  . heparin  5,000 Units Subcutaneous Q8H  . insulin aspart  0-9 Units Subcutaneous TID WC  . insulin aspart   4 Units Subcutaneous TID WC  . insulin glargine  10 Units Subcutaneous Daily  . irbesartan  150 mg Oral Daily  . metoprolol succinate  100 mg Oral QHS  . montelukast  10 mg Oral QHS  . multivitamin  1 tablet Oral QHS  . ondansetron  4 mg Intramuscular Once  . pantoprazole  40 mg Oral QHS  . piperacillin-tazobactam (ZOSYN)  IV  2.25 g Intravenous Q8H  . predniSONE  5 mg Oral Daily  . sodium chloride  3 mL Intravenous Q12H  . tacrolimus  1 mg Oral BID  . vancomycin  750 mg Intravenous Q T,Th,Sa-HD

## 2013-05-06 NOTE — Progress Notes (Signed)
CRITICAL VALUE ALERT  Critical value received:  Na= 114 and K= 1145  Date of notification:  05/06/2013  Time of notification:  0935  Critical value read back:yes  Nurse who received alert:  Lovie Macadamia RN  MD notified (1st page):  Dr Blake Divine  Time of first page:  1000  MD notified (2nd page):  Time of second page:  Responding MD:  Dr Blake Divine  Time MD responded:  1005

## 2013-05-06 NOTE — Procedures (Signed)
ELECTROENCEPHALOGRAM REPORT   Patient: [name]       Room #: 1O10 EEG No. ID: [id number] Age: 42 y.o.        Sex: female Referring Physician: Blake Divine Report Date:  05/06/2013        Interpreting Physician: Aline Brochure  History: [name] is an 41 y.o. 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 Patient was admitted with acute encephalopathy as well as possible seizure.  Indications for study:  Assess severity of encephalopathy; rule out seizure disorder.  Technique: This is an 18 channel routine scalp EEG performed at the bedside with bipolar and monopolar montages arranged in accordance to the international 10/20 system of electrode placement.   Description: This EEG recording was performed during wakefulness. Background activity consisted of low amplitude 1-2 Hz diffuse symmetrical delta activity with superimposed 7-8 Hz theta activity which was most rhythmic appearance in the posterior head regions. Photic stimulation produced a minimal bilateral occipital driving response. Hyperventilation was not performed. No epileptiform discharges were recorded.  Interpretation: EEG is abnormal with moderate generalized slowing consistent with moderately severe encephalopathy. This pattern of slowing is nonspecific and can be seen with metabolic as well as degenerative encephalopathies. No evidence of an epileptic disorder was demonstrated.   Venetia Maxon M.D. [facility] [phone]

## 2013-05-06 NOTE — Progress Notes (Signed)
Hypoglycemic Event  CBG: 34 at 1823  Treatment: D50 IV 50 mL  Symptoms: None  Follow-up CBG: Time:1902 CBG Result:117  Possible Reasons for Event: was on glucose stabilizer  Comments/MD notified:notified Dr Blake Divine about the 34 blood sugar. Notified K. Schorr (NP) about the f/u blood sugar of 117.     Reedley, Gloriajean Dell R  Remember to initiate Hypoglycemia Order Set & complete

## 2013-05-06 NOTE — Progress Notes (Signed)
ANTIBIOTIC CONSULT NOTE - FOLLOW UP  Pharmacy Consult for Vancomycin + Zosyn Indication: r/o HCAP  Allergies  Allergen Reactions  . Depakote [Divalproex Sodium] Other (See Comments)    Pt gets "delirious"  . Morphine And Related Nausea And Vomiting and Other (See Comments)    "makes me delirious"  . Penicillins Anaphylaxis  . Tramadol Nausea And Vomiting  . Vicodin [Hydrocodone-Acetaminophen] Itching and Rash    Patient Measurements: Weight: 145 lb 3.2 oz (65.862 kg)  Vital Signs: Temp: 98 F (36.7 C) (12/10 1320) Temp src: Oral (12/10 1320) BP: 154/78 mmHg (12/10 1320) Pulse Rate: 74 (12/10 1320) Intake/Output from previous day: 12/09 0701 - 12/10 0700 In: 290 [P.O.:240; IV Piggyback:50] Out: 3887 [Stool:1] Intake/Output from this shift: Total I/O In: 120 [P.O.:120] Out: -   Labs:  Recent Labs  05/04/13 0555 05/05/13 1145 05/06/13 0935  WBC 9.1 8.6  --   HGB 11.3* 9.5*  --   PLT 106* 111*  --   CREATININE 5.77* 7.71* 5.00*   The CrCl is unknown because both a height and weight (above a minimum accepted value) are required for this calculation. No results found for this basename: VANCOTROUGH, Leodis Binet, VANCORANDOM, GENTTROUGH, GENTPEAK, GENTRANDOM, TOBRATROUGH, TOBRAPEAK, TOBRARND, AMIKACINPEAK, AMIKACINTROU, AMIKACIN,  in the last 72 hours   Microbiology: Recent Results (from the past 720 hour(s))  CULTURE, BLOOD (ROUTINE X 2)     Status: None   Collection Time    05/03/13  2:35 PM      Result Value Range Status   Specimen Description BLOOD HEMODIALYSIS LINE   Final   Special Requests BOTTLES DRAWN AEROBIC AND ANAEROBIC 10CC   Final   Culture  Setup Time     Final   Value: 05/04/2013 00:10     Performed at [facility]   Culture     Final   Value:        BLOOD CULTURE RECEIVED NO GROWTH TO DATE CULTURE WILL BE HELD FOR 5 DAYS BEFORE ISSUING A FINAL NEGATIVE REPORT     Performed at [facility]   Report Status PENDING   Incomplete   CULTURE, BLOOD (ROUTINE X 2)     Status: None   Collection Time    05/03/13  2:50 PM      Result Value Range Status   Specimen Description BLOOD HEMODIALYSIS LINE   Final   Special Requests BOTTLES DRAWN AEROBIC AND ANAEROBIC 10CC   Final   Culture  Setup Time     Final   Value: 05/04/2013 00:10     Performed at [facility]   Culture     Final   Value:        BLOOD CULTURE RECEIVED NO GROWTH TO DATE CULTURE WILL BE HELD FOR 5 DAYS BEFORE ISSUING A FINAL NEGATIVE REPORT     Performed at [facility]   Report Status PENDING   Incomplete    Anti-infectives   Start     Dose/Rate Route Frequency Ordered Stop   05/05/13 1200  vancomycin (VANCOCIN) IVPB 750 mg/150 ml premix     750 mg 150 mL/hr over 60 Minutes Intravenous Every T-Th-Sa (Hemodialysis) 05/03/13 1622     05/03/13 1630  vancomycin (VANCOCIN) 1,500 mg in sodium chloride 0.9 % 500 mL IVPB     1,500 mg 250 mL/hr over 120 Minutes Intravenous  Once 05/03/13 1622 05/03/13 1922   05/03/13 1630  piperacillin-tazobactam (ZOSYN) IVPB 2.25 g     2.25 g 100  mL/hr over 30 Minutes Intravenous 3 times per day 05/03/13 1622        Assessment: 42 y.o. F who continues on Vancomycin + Zosyn D#4 for r/o HCAP. The patient was loaded with Vancomycin 1500 mg on 12/7 and received an appropriate MD post-HD on 12/9. The patient also continues on Zosyn -- noted allergy to PCN -- however the patient has had no issues this admission. Doses of both antibiotics remain appropriate. Tmax/24h: 99.1, WBC wnl.   Vanc 12/7 >> Zosyn 12/7 >>  12/7 BCx >> ngtd  Goal of Therapy:  Pre-HD level of 15-25 mcg/ml  Plan:  1. Continue Vancomycin 750 mg post HD sessions on T/Th/Sat 2. Continue Zosyn 2.25g IV every 8 hours 3. Will continue to follow HD schedule/duration, culture results, LOT, and antibiotic de-escalation plans   Georgina Pillion, PharmD, BCPS Clinical Pharmacist Pager: [phone] 05/06/2013 2:15 PM

## 2013-05-06 NOTE — Progress Notes (Addendum)
CRITICAL VALUE ALERT  Critical value received:  Na= 119 and K= 7.1 lab reported sample was a little hemolized and md made aware. Order by Md to recollect Bmet and to notified result to MD.  Date of notification:  05/06/2013  Time of notification:  1518  Critical value read back:yes  Nurse who received alert:  Lovie Macadamia RN  MD notified (1st page):  Dr Blake Divine  Time of first page:  1530  MD notified (2nd page):  Time of second page:  Responding MD:  Dr Blake Divine  Time MD responded:  [phone]

## 2013-05-06 NOTE — Progress Notes (Addendum)
[facility] PROGRESS NOTE  [name] MVH:[id number] DOB: 10/21/1970 DOA: 05/03/2013 PCP: [name], MD  Brief narrative  42 year old female with history of incision and disease status post renal transplant now on HD ( Tuesday, Thursday and Saturday), uncontrolled type 1diabetes mellitus, hypertension, hyperlipidemia, anemia, depression, COPD with recurrent hospitalization several times with issues of uncontrolled DM presented with acute respiratory failure due to fluid overload from missing dialysis and hypoglycemia. Initially she was placed on BiPAP and received D50 and was then taken for urgent dialysis for which her breathing improved. Patient febrile with possible infiltrate on CXR. Also now with hyperglycemia with cbg's in 600's .  Assessment/Plan:  Acute encephalopathy  due to combination of hypoxic acute respiratory failure and metabolic encephalopathy from hypoglycemia/ hyperglycemia -Hypoxic respiratory failure secondary to missed dialysis and volume overload. Symptoms improved after urgent hemodialysis.  -admission CXR CHF versus pneumonia.  -Encephalopathy resolved after D50 and placed on BiPAP.  -patient also reports that her aunt noticed her to have seizure like activity during the initial episode. This has happened to her once before last month. Could be hypoglycemia seizure. Will obtain EEG, which is pending.   Uncontrolled type 1 DM  Likely in the setting of missed dialysis and continuing to take long-acting insulin. Hypoglycemia resolved but now has elevated blood glucose. Received short acting insulin which made her fsg to drop later in the afternoon.  . -Her last A1c was 10.  - on further discussing with the patient , she reports she takes 6 units of lantus twice a day, I have discussed the fact that her home insulin dose reflects that she is on daily dose. Pt insists that she takes twice daily dosing. I have made the appropriate changes in the  medications.   Healthcare associated versus aspiration pneumonia  Patient had infiltrate on chest x-ray with fever while on dialysis. Started on empiric vancomycin and Zosyn. Follow cultures . Repeat CXR today, if the infiltrates are improving, will de escalate the antibiotics. She is afebrile and does not have leukocytosis and she appears non toxic.   ESRD on HD  . Dialysis per renal. SW consulted for transportation needs.  -Continue PhosLo and hectoral   Status post kidney-pancreatic transplant.  Continue home transplant medications along with prednisone.   Hypertension.  Poorly controlled upon presentation. Now improved with dialysis. Resume all medications and when necessary hydralazine   . GERD  continue PPI.   Migraine headaches  Resume home medications. Added toradol and increased imitrex to 50 mg BID prn.   DVT Prophylaxis : Subcutaneous Heparin  Diet: Diabetic   Code Status: Full code   Family Communication: None at bedside   Disposition Plan: Home  Once blood glucose remains stable   Consultants:  Renal  Procedures:  Hemodialysis  Antibiotics:  IV vancomycin and Zosyn (12/ 7--)  HPI/Subjective:  Pt hyperglycemic with cbg in 600's . Reports migraine headache. Increased imitrex to 50 mg bid. She also reports that she takes lantus insulin twice daily. Made appropriate changes to insulin regimen.   Objective: Filed Vitals:   05/06/13 0511  BP: 117/73  Pulse: 75  Temp: 98.5 F (36.9 C)  Resp: 16    Intake/Output Summary (Last 24 hours) at 05/06/13 0938 Last data filed at 05/06/13 0700  Gross per 24 hour  Intake    170 ml  Output   3887 ml  Net  -3717 ml   Filed Weights   05/05/13 1115 05/05/13 1517 05/05/13 2045  Weight:  68.1 kg (150 lb 2.1 oz) 63.9 kg (140 lb 14 oz) 65.862 kg (145 lb 3.2 oz)    Exam:  General: Middle aged female in  Distress from headache.   HEENT: No pallor, no icterus, moist oral mucosa  Chest: Right lower lobe crackles, no  rhonchi or wheeze  CVS: Normal S1-S2, no murmurs rub or gallop  Abdomen: Soft, nontender, nondistended, bowel sounds present  Extremities: Warm, no edema  CNS: AAO x3   Data Reviewed: Basic Metabolic Panel:  Recent Labs Lab 05/03/13 0653 05/04/13 0555 05/05/13 1145  NA 140 126* 129*  K 4.4 5.2* 4.0  CL 98 87* 89*  CO2 26 27 23   GLUCOSE 31* 624* 198*  BUN 26* 27* 47*  CREATININE 9.48* 5.77* 7.71*  CALCIUM 8.0* 7.3* 7.7*  PHOS  --   --  1.7*   Liver Function Tests:  Recent Labs Lab 05/03/13 0653 05/05/13 1145  AST 21  --   ALT 9  --   ALKPHOS 199*  --   BILITOT 0.5  --   PROT 7.7  --   ALBUMIN 3.5 2.5*   No results found for this basename: LIPASE, AMYLASE,  in the last 168 hours No results found for this basename: AMMONIA,  in the last 168 hours CBC:  Recent Labs Lab 05/03/13 0653 05/04/13 0555 05/05/13 1145  WBC 10.0 9.1 8.6  NEUTROABS 8.5*  --   --   HGB 13.1 11.3* 9.5*  HCT 42.0 36.2 30.5*  MCV 105.3* 105.2* 102.7*  PLT 167 106* 111*   Cardiac Enzymes:  Recent Labs Lab 05/03/13 0653  TROPONINI <0.30   BNP (last 3 results)  Recent Labs  05/03/13 0653  PROBNP 33341.0*   CBG:  Recent Labs Lab 05/05/13 0752 05/05/13 1627 05/05/13 2040 05/06/13 0809 05/06/13 0918  GLUCAP 428* 196* 210* >600* >600*    Recent Results (from the past 240 hour(s))  CULTURE, BLOOD (ROUTINE X 2)     Status: None   Collection Time    05/03/13  2:35 PM      Result Value Range Status   Specimen Description BLOOD HEMODIALYSIS LINE   Final   Special Requests BOTTLES DRAWN AEROBIC AND ANAEROBIC 10CC   Final   Culture  Setup Time     Final   Value: 05/04/2013 00:10     Performed at [facility]   Culture     Final   Value:        BLOOD CULTURE RECEIVED NO GROWTH TO DATE CULTURE WILL BE HELD FOR 5 DAYS BEFORE ISSUING A FINAL NEGATIVE REPORT     Performed at [facility]   Report Status PENDING   Incomplete  CULTURE, BLOOD (ROUTINE X 2)      Status: None   Collection Time    05/03/13  2:50 PM      Result Value Range Status   Specimen Description BLOOD HEMODIALYSIS LINE   Final   Special Requests BOTTLES DRAWN AEROBIC AND ANAEROBIC 10CC   Final   Culture  Setup Time     Final   Value: 05/04/2013 00:10     Performed at [facility]   Culture     Final   Value:        BLOOD CULTURE RECEIVED NO GROWTH TO DATE CULTURE WILL BE HELD FOR 5 DAYS BEFORE ISSUING A FINAL NEGATIVE REPORT     Performed at [facility]   Report Status PENDING   Incomplete  Studies: No results found.  Scheduled Meds: . amLODipine  10 mg Oral Daily  . calcium acetate  667 mg Oral TID WC  . clonazePAM  1 mg Oral QHS  . [START ON 05/07/2013] darbepoetin (ARANESP) injection - DIALYSIS  100 mcg Intravenous Q Thu-HD  . doxercalciferol  1 mcg Intravenous Q T,Th,Sa-HD  . feeding supplement (NEPRO CARB STEADY)  237 mL Oral Q24H  . gabapentin  100 mg Oral Daily  . heparin  5,000 Units Subcutaneous Q8H  . insulin aspart  0-9 Units Subcutaneous TID WC  . insulin aspart  4 Units Subcutaneous TID WC  . insulin glargine  6 Units Subcutaneous BID  . irbesartan  150 mg Oral Daily  . metoprolol succinate  100 mg Oral QHS  . montelukast  10 mg Oral QHS  . multivitamin  1 tablet Oral QHS  . ondansetron  4 mg Intramuscular Once  . pantoprazole  40 mg Oral QHS  . piperacillin-tazobactam (ZOSYN)  IV  2.25 g Intravenous Q8H  . predniSONE  5 mg Oral Daily  . sodium chloride  3 mL Intravenous Q12H  . tacrolimus  1 mg Oral BID  . vancomycin  750 mg Intravenous Q T,Th,Sa-HD   Continuous Infusions:     Time spent: 25 minutes    Xzayvier Fagin  [facility] Pager [phone]. If 7PM-7AM, please contact night-coverage at [email or web address], password [facility] 05/06/2013, 9:38 AM  LOS: 3 days

## 2013-05-06 NOTE — Progress Notes (Signed)
I have personally seen and examined this patient and agree with the assessment/plan as outlined above by [facility] PA. Haelee Bolen K.,MD 05/06/2013 10:52 AM

## 2013-05-06 NOTE — Progress Notes (Signed)
EEG Completed; Results Pending  

## 2013-05-07 DIAGNOSIS — G9340 Encephalopathy, unspecified: Secondary | ICD-10-CM

## 2013-05-07 LAB — BASIC METABOLIC PANEL
BUN: 35 mg/dL — ABNORMAL HIGH (ref 6–23)
BUN: 39 mg/dL — ABNORMAL HIGH (ref 6–23)
BUN: 40 mg/dL — ABNORMAL HIGH (ref 6–23)
BUN: 41 mg/dL — ABNORMAL HIGH (ref 6–23)
BUN: 11 mg/dL (ref 6–23)
CO2: 23 mEq/L (ref 19–32)
CO2: 19 mEq/L (ref 19–32)
CO2: 21 mEq/L (ref 19–32)
CO2: 22 mEq/L (ref 19–32)
CO2: 26 mEq/L (ref 19–32)
Calcium: 7.8 mg/dL — ABNORMAL LOW (ref 8.4–10.5)
Calcium: 7.5 mg/dL — ABNORMAL LOW (ref 8.4–10.5)
Calcium: 7.7 mg/dL — ABNORMAL LOW (ref 8.4–10.5)
Calcium: 8.3 mg/dL — ABNORMAL LOW (ref 8.4–10.5)
Calcium: 7.6 mg/dL — ABNORMAL LOW (ref 8.4–10.5)
Chloride: 85 mEq/L — ABNORMAL LOW (ref 96–112)
Chloride: 87 mEq/L — ABNORMAL LOW (ref 96–112)
Chloride: 94 mEq/L — ABNORMAL LOW (ref 96–112)
Creatinine, Ser: 6.1 mg/dL — ABNORMAL HIGH (ref 0.50–1.10)
Creatinine, Ser: 6.19 mg/dL — ABNORMAL HIGH (ref 0.50–1.10)
Creatinine, Ser: 6.43 mg/dL — ABNORMAL HIGH (ref 0.50–1.10)
Creatinine, Ser: 6.83 mg/dL — ABNORMAL HIGH (ref 0.50–1.10)
Creatinine, Ser: 7.04 mg/dL — ABNORMAL HIGH (ref 0.50–1.10)
Creatinine, Ser: 3.19 mg/dL — ABNORMAL HIGH (ref 0.50–1.10)
Creatinine, Ser: 3.57 mg/dL — ABNORMAL HIGH (ref 0.50–1.10)
GFR calc Af Amer: 9 mL/min — ABNORMAL LOW (ref >90)
GFR calc Af Amer: 8 mL/min — ABNORMAL LOW (ref >90)
GFR calc Af Amer: 8 mL/min — ABNORMAL LOW (ref >90)
GFR calc Af Amer: 20 mL/min — ABNORMAL LOW (ref >90)
GFR calc non Af Amer: 8 mL/min — ABNORMAL LOW (ref >90)
GFR calc non Af Amer: 7 mL/min — ABNORMAL LOW (ref >90)
GFR calc non Af Amer: 7 mL/min — ABNORMAL LOW (ref >90)
GFR calc non Af Amer: 7 mL/min — ABNORMAL LOW (ref >90)
GFR calc non Af Amer: 15 mL/min — ABNORMAL LOW (ref >90)
Glucose, Bld: 239 mg/dL — ABNORMAL HIGH (ref 70–99)
Glucose, Bld: 426 mg/dL — ABNORMAL HIGH (ref 70–99)
Glucose, Bld: 103 mg/dL — ABNORMAL HIGH (ref 70–99)
Glucose, Bld: 45 mg/dL — ABNORMAL LOW (ref 70–99)
Glucose, Bld: 159 mg/dL — ABNORMAL HIGH (ref 70–99)
Glucose, Bld: 449 mg/dL — ABNORMAL HIGH (ref 70–99)
Potassium: 5.2 mEq/L — ABNORMAL HIGH (ref 3.5–5.1)
Potassium: 4 mEq/L (ref 3.5–5.1)
Sodium: 125 mEq/L — ABNORMAL LOW (ref 135–145)
Sodium: 121 mEq/L — ABNORMAL LOW (ref 135–145)
Sodium: 129 mEq/L — ABNORMAL LOW (ref 135–145)

## 2013-05-07 LAB — RENAL FUNCTION PANEL
Albumin: 3.1 g/dL — ABNORMAL LOW (ref 3.5–5.2)
BUN: 41 mg/dL — ABNORMAL HIGH (ref 6–23)
CO2: 22 mEq/L (ref 19–32)
Calcium: 7.6 mg/dL — ABNORMAL LOW (ref 8.4–10.5)
Chloride: 88 mEq/L — ABNORMAL LOW (ref 96–112)
Creatinine, Ser: 7 mg/dL — ABNORMAL HIGH (ref 0.50–1.10)
GFR calc Af Amer: 8 mL/min — ABNORMAL LOW (ref >90)
GFR calc non Af Amer: 7 mL/min — ABNORMAL LOW (ref >90)
Glucose, Bld: 45 mg/dL — ABNORMAL LOW (ref 70–99)
Phosphorus: 4 mg/dL (ref 2.3–4.6)
Potassium: 4.1 mEq/L (ref 3.5–5.1)
Sodium: 128 mEq/L — ABNORMAL LOW (ref 135–145)

## 2013-05-07 LAB — CBC
HCT: 33.9 % — ABNORMAL LOW (ref 36.0–46.0)
Hemoglobin: 10.9 g/dL — ABNORMAL LOW (ref 12.0–15.0)
MCH: 32 pg (ref 26.0–34.0)
MCHC: 32.2 g/dL (ref 30.0–36.0)
MCV: 99.4 fL (ref 78.0–100.0)
Platelets: 159 10*3/uL (ref 150–400)
RBC: 3.41 MIL/uL — ABNORMAL LOW (ref 3.87–5.11)
RDW: 14.4 % (ref 11.5–15.5)
WBC: 12.1 10*3/uL — ABNORMAL HIGH (ref 4.0–10.5)

## 2013-05-07 LAB — GLUCOSE, CAPILLARY
Glucose-Capillary: 258 mg/dL — ABNORMAL HIGH (ref 70–99)
Glucose-Capillary: 368 mg/dL — ABNORMAL HIGH (ref 70–99)
Glucose-Capillary: 257 mg/dL — ABNORMAL HIGH (ref 70–99)
Glucose-Capillary: 163 mg/dL — ABNORMAL HIGH (ref 70–99)

## 2013-05-07 MED ORDER — PENTAFLUOROPROP-TETRAFLUOROETH EX AERO: 1.0000 "application " | INHALATION_SPRAY | CUTANEOUS | Status: DC | PRN

## 2013-05-07 MED ORDER — LIDOCAINE-PRILOCAINE 2.5-2.5 % EX CREA
1.0000 "application " | TOPICAL_CREAM | CUTANEOUS | Status: DC | PRN
  Filled 2013-05-07: qty 5

## 2013-05-07 MED ORDER — LIDOCAINE HCL (PF) 1 % IJ SOLN: 5.0000 mL | INTRAMUSCULAR | Status: DC | PRN

## 2013-05-07 MED ORDER — ALTEPLASE 2 MG IJ SOLR
2.0000 mg | Freq: Once | INTRAMUSCULAR | Status: DC | PRN
  Filled 2013-05-07: qty 2

## 2013-05-07 MED ORDER — LIDOCAINE-PRILOCAINE 2.5-2.5 % EX CREA: 1.0000 "application " | TOPICAL_CREAM | CUTANEOUS | Status: DC | PRN

## 2013-05-07 MED ORDER — INSULIN ASPART 100 UNIT/ML ~~LOC~~ SOLN
3.0000 [IU] | Freq: Once | SUBCUTANEOUS | Status: AC
  Administered 2013-05-07: 3 [IU] via SUBCUTANEOUS

## 2013-05-07 MED ORDER — SODIUM CHLORIDE 0.9 % IV SOLN: 100.0000 mL | INTRAVENOUS | Status: DC | PRN

## 2013-05-07 MED ORDER — SODIUM CHLORIDE 0.9 % IV SOLN: INTRAVENOUS | Status: DC

## 2013-05-07 MED ORDER — ONDANSETRON HCL 4 MG/2ML IJ SOLN
INTRAMUSCULAR | Status: AC
  Administered 2013-05-07: 4 mg via INTRAVENOUS
  Filled 2013-05-07: qty 2

## 2013-05-07 MED ORDER — ALTEPLASE 2 MG IJ SOLR: 2.0000 mg | Freq: Once | INTRAMUSCULAR | Status: DC | PRN

## 2013-05-07 MED ORDER — INSULIN ASPART 100 UNIT/ML ~~LOC~~ SOLN: 4.0000 [IU] | Freq: Once | SUBCUTANEOUS | Status: DC

## 2013-05-07 MED ORDER — LEVOFLOXACIN 500 MG PO TABS
500.0000 mg | ORAL_TABLET | ORAL | Status: DC
  Administered 2013-05-07 – 2013-05-09 (×2): 500 mg via ORAL
  Filled 2013-05-07 (×2): qty 1

## 2013-05-07 MED ORDER — HYDROMORPHONE HCL PF 1 MG/ML IJ SOLN
1.0000 mg | Freq: Four times a day (QID) | INTRAMUSCULAR | Status: DC | PRN
  Administered 2013-05-07 – 2013-05-10 (×8): 1 mg via INTRAVENOUS
  Filled 2013-05-07 (×9): qty 1

## 2013-05-07 MED ORDER — NEPRO/CARBSTEADY PO LIQD
237.0000 mL | ORAL | Status: DC | PRN
  Filled 2013-05-07: qty 237

## 2013-05-07 MED ORDER — NEPRO/CARBSTEADY PO LIQD: 237.0000 mL | ORAL | Status: DC | PRN

## 2013-05-07 MED ORDER — INSULIN ASPART 100 UNIT/ML ~~LOC~~ SOLN: 2.0000 [IU] | Freq: Three times a day (TID) | SUBCUTANEOUS | Status: DC

## 2013-05-07 MED ORDER — DOXERCALCIFEROL 4 MCG/2ML IV SOLN
INTRAVENOUS | Status: AC
  Administered 2013-05-07: 1 ug via INTRAVENOUS
  Filled 2013-05-07: qty 2

## 2013-05-07 MED ORDER — DARBEPOETIN ALFA-POLYSORBATE 100 MCG/0.5ML IJ SOLN
INTRAMUSCULAR | Status: AC
  Administered 2013-05-07: 100 ug via INTRAVENOUS
  Filled 2013-05-07: qty 0.5

## 2013-05-07 MED ORDER — SODIUM CHLORIDE 0.9 % IV SOLN
100.0000 mL | INTRAVENOUS | Status: DC | PRN
Start: 2013-05-07 — End: 2013-05-07

## 2013-05-07 MED ORDER — HYDROMORPHONE HCL PF 1 MG/ML IJ SOLN
INTRAMUSCULAR | Status: AC
  Administered 2013-05-07: 1 mg
  Filled 2013-05-07: qty 1

## 2013-05-07 MED ORDER — HEPARIN SODIUM (PORCINE) 1000 UNIT/ML DIALYSIS: 1000.0000 [IU] | INTRAMUSCULAR | Status: DC | PRN

## 2013-05-07 MED ORDER — INSULIN ASPART 100 UNIT/ML ~~LOC~~ SOLN
0.0000 [IU] | Freq: Three times a day (TID) | SUBCUTANEOUS | Status: DC
  Administered 2013-05-07: 5 [IU] via SUBCUTANEOUS
  Administered 2013-05-07: 9 [IU] via SUBCUTANEOUS
  Administered 2013-05-08: 1 [IU] via SUBCUTANEOUS
  Administered 2013-05-08: 7 [IU] via SUBCUTANEOUS

## 2013-05-07 NOTE — Progress Notes (Signed)
05/07/13 06:46 Patient's CBG 368,K. Schorr,NP notified.Order received.Will continue to monitor pt. Tarynn Garling Joselita,RN

## 2013-05-07 NOTE — Progress Notes (Signed)
[facility] PROGRESS NOTE  [name] WJX:[id number] DOB: Mar 26, 1971 DOA: 05/03/2013 PCP: [name], MD  Brief narrative  42 year old female with history of incision and disease status post renal transplant now on HD ( Tuesday, Thursday and Saturday), uncontrolled type 1diabetes mellitus, hypertension, hyperlipidemia, anemia, depression, COPD with recurrent hospitalization several times with issues of uncontrolled DM presented with acute respiratory failure due to fluid overload from missing dialysis and hypoglycemia. Initially she was placed on BiPAP and received D50 and was then taken for urgent dialysis for which her breathing improved. Patient febrile with possible infiltrate on CXR. She was started on broad spectrum antibiotics for the management of health care associated pneumonia. Repeat CXR on 12/10 showed improvement in the aeration, without any worsening of the pneumonia. She has been afebrile and we will start de escalating antibiotics.  Assessment/Plan:  Acute encephalopathy  due to combination of hypoxic acute respiratory failure and metabolic encephalopathy from hypoglycemia/ hyperglycemia -Hypoxic respiratory failure secondary to missed dialysis and volume overload and hcap. Symptoms improved after urgent hemodialysis.  -admission CXR CHF versus pneumonia.  -patient also reports that her aunt noticed her to have seizure like activity during the initial episode. This has happened to her once before last month. Could be hypoglycemia seizure. Will obtain EEG, which showed mod to severe encephalopathic changes and no epileptiform activity.   Uncontrolled type 1 DM  Likely in the setting of missed dialysis and continuing to take long-acting insulin. Hypoglycemia resolved but now has elevated blood glucose. Received short acting insulin which made her fsg to drop later in the afternoon.  . -Her last A1c was 10.  - on further discussing with the patient , she  reports she takes 6 units of lantus twice a day, I have discussed the fact that her home insulin dose reflects that she is on daily dose. Pt insists that she takes twice daily dosing. I have made the appropriate changes in the medications. On 12/10, pt 's cbg were in 1100's, she was put on glucose stabilizer and she became hypoglycemic with cbg's in low 30's. She was taken off the glucose stabilizer and was put on lantus 5 units BID, with 2 units TIDAC and sensitive SSI. She is also non compliant to diet. She is eating chips and drinking sodas.  Her last BMP showed anion gap of 21. As per the RN, she refused another BMP following that. Discussed with the patient the need for another BMP to see her anion gap, as we would need to put her back on the glucose stabilizer. Pt agreed and we will wait the results.   Healthcare associated versus aspiration pneumonia  Patient had infiltrate on chest x-ray with fever while on dialysis. Started on empiric vancomycin and Zosyn. Cultures have been negative so far. Repeat CXR on 12/10 showed improved aeration, did not show worsening infiltrates.  She is afebrile and does not have leukocytosis and she appears non toxic.  We will de escalete the antibiotics to oral antibiotics.   ESRD on HD  . Dialysis per renal. SW consulted for transportation needs.  -Continue PhosLo and hectoral   Status post kidney-pancreatic transplant.  Continue home transplant medications along with prednisone.   Hypertension.  Poorly controlled upon presentation. Now improved with dialysis. Resume all medications and when necessary hydralazine   . GERD  continue PPI.   Migraine headaches  Resume home medications. increased imitrex to 50 mg BID prn.   DVT Prophylaxis : Subcutaneous Heparin  Diet: Diabetic  Code Status: Full code   Family Communication: None at bedside   Disposition Plan: Home  Once blood glucose remains stable   Consultants:  Renal  Procedures:   Hemodialysis  Antibiotics:  IV vancomycin and Zosyn (12/ 7--)  HPI/Subjective:  She is drowsy, says her headache improved, but she had vomited yesterday. Her cbg is in 200s.   Objective: Filed Vitals:   05/07/13 0750  BP: 121/67  Pulse: 64  Temp: 97.4 F (36.3 C)  Resp: 16    Intake/Output Summary (Last 24 hours) at 05/07/13 1155 Last data filed at 05/07/13 0900  Gross per 24 hour  Intake 626.67 ml  Output      0 ml  Net 626.67 ml   Filed Weights   05/05/13 1517 05/05/13 2045 05/06/13 2204  Weight: 63.9 kg (140 lb 14 oz) 65.862 kg (145 lb 3.2 oz) 66.906 kg (147 lb 8 oz)    Exam:  General: Middle aged female in  No distress.  HEENT: No pallor, no icterus, moist oral mucosa  Chest: Right lower lobe crackles, no rhonchi or wheeze  CVS: Normal S1-S2, no murmurs rub or gallop  Abdomen: Soft, nontender, nondistended, bowel sounds present  Extremities: Warm, no edema  CNS: AAO x3   Data Reviewed: Basic Metabolic Panel:  Recent Labs Lab 05/05/13 1145  05/06/13 1518 05/06/13 1805 05/07/13 0145 05/07/13 0247 05/07/13 0718  NA 129*  < > 119* 127* 125* 122* 121*  K 4.0  < > 7.1* 4.1 5.0 5.5* 5.2*  CL 89*  < > 85* 86* 84* 85* 84*  CO2 23  < > 15* 24 23 19  17*  GLUCOSE 198*  < > 161* 42* 239* 252* 426*  BUN 47*  < > 32* 33* 34* 35* 39*  CREATININE 7.71*  < > 5.17* 5.67* 6.10* 6.19* 6.43*  CALCIUM 7.7*  < > 7.3* 8.0* 7.8* 7.5* 7.5*  PHOS 1.7*  --   --   --   --   --   --   < > = values in this interval not displayed. Liver Function Tests:  Recent Labs Lab 05/03/13 0653 05/05/13 1145  AST 21  --   ALT 9  --   ALKPHOS 199*  --   BILITOT 0.5  --   PROT 7.7  --   ALBUMIN 3.5 2.5*   No results found for this basename: LIPASE, AMYLASE,  in the last 168 hours No results found for this basename: AMMONIA,  in the last 168 hours CBC:  Recent Labs Lab 05/03/13 0653 05/04/13 0555 05/05/13 1145  WBC 10.0 9.1 8.6  NEUTROABS 8.5*  --   --   HGB 13.1 11.3*  9.5*  HCT 42.0 36.2 30.5*  MCV 105.3* 105.2* 102.7*  PLT 167 106* 111*   Cardiac Enzymes:  Recent Labs Lab 05/03/13 0653  TROPONINI <0.30   BNP (last 3 results)  Recent Labs  05/03/13 0653  PROBNP 33341.0*   CBG:  Recent Labs Lab 05/07/13 0103 05/07/13 0313 05/07/13 0546 05/07/13 0856 05/07/13 1103  GLUCAP 252* 258* 368* 354* 257*    Recent Results (from the past 240 hour(s))  CULTURE, BLOOD (ROUTINE X 2)     Status: None   Collection Time    05/03/13  2:35 PM      Result Value Range Status   Specimen Description BLOOD HEMODIALYSIS LINE   Final   Special Requests BOTTLES DRAWN AEROBIC AND ANAEROBIC 10CC   Final   Culture  Setup Time  Final   Value: 05/04/2013 00:10     Performed at [facility]   Culture     Final   Value:        BLOOD CULTURE RECEIVED NO GROWTH TO DATE CULTURE WILL BE HELD FOR 5 DAYS BEFORE ISSUING A FINAL NEGATIVE REPORT     Performed at [facility]   Report Status PENDING   Incomplete  CULTURE, BLOOD (ROUTINE X 2)     Status: None   Collection Time    05/03/13  2:50 PM      Result Value Range Status   Specimen Description BLOOD HEMODIALYSIS LINE   Final   Special Requests BOTTLES DRAWN AEROBIC AND ANAEROBIC 10CC   Final   Culture  Setup Time     Final   Value: 05/04/2013 00:10     Performed at [facility]   Culture     Final   Value:        BLOOD CULTURE RECEIVED NO GROWTH TO DATE CULTURE WILL BE HELD FOR 5 DAYS BEFORE ISSUING A FINAL NEGATIVE REPORT     Performed at [facility]   Report Status PENDING   Incomplete     Studies: Dg Chest 2 View  05/06/2013   CLINICAL DATA:  The patient was told she had pneumonia 2 days ago. The patient is on antibiotics.  EXAM: CHEST  2 VIEW  COMPARISON:  05/03/2013  FINDINGS: The heart is enlarged. There has been improvement in aeration of the right lung. Persistent infiltrates are identified at the lung bases bilaterally, left greater than right. Findings favor  infectious infiltrate over edema. Surgical clips are identified in the right upper quadrant of the abdomen.  IMPRESSION: 1. Cardiomegaly without definite evidence for pulmonary edema. 2. Improving aeration with persistent bilateral lower lobe infiltrates.   Electronically Signed   By: Rosalie Gums M.D.   On: 05/06/2013 19:33    Scheduled Meds: . amLODipine  10 mg Oral Daily  . calcium acetate  667 mg Oral TID WC  . clonazePAM  1 mg Oral QHS  . darbepoetin (ARANESP) injection - DIALYSIS  100 mcg Intravenous Q Thu-HD  . doxercalciferol  1 mcg Intravenous Q T,Th,Sa-HD  . feeding supplement (NEPRO CARB STEADY)  237 mL Oral Q24H  . gabapentin  100 mg Oral Daily  . heparin  5,000 Units Subcutaneous Q8H  . insulin aspart  0-9 Units Subcutaneous TID WC  . insulin aspart  2 Units Subcutaneous TID WC  . insulin glargine  5 Units Subcutaneous BID  . irbesartan  150 mg Oral Daily  . metoprolol succinate  100 mg Oral QHS  . montelukast  10 mg Oral QHS  . multivitamin  1 tablet Oral QHS  . pantoprazole  40 mg Oral QHS  . piperacillin-tazobactam (ZOSYN)  IV  2.25 g Intravenous Q8H  . predniSONE  5 mg Oral Daily  . sodium chloride  3 mL Intravenous Q12H  . tacrolimus  1 mg Oral BID  . vancomycin  750 mg Intravenous Q T,Th,Sa-HD   Continuous Infusions: . sodium chloride        Time spent: 25 minutes    Tonya Wantz  [facility] Pager [phone]. If 7PM-7AM, please contact night-coverage at [email or web address], password [facility] 05/07/2013, 11:54 AM  LOS: 4 days

## 2013-05-07 NOTE — Progress Notes (Signed)
[facility] Progress Note  Subjective:   "Vomited all day yesterday...didn't look at it" Feels bad. Had BM yesterday. Right low back still hurts  Objective Filed Vitals:   05/06/13 2102 05/06/13 2204 05/07/13 0414 05/07/13 0750  BP: 117/67  104/65 121/67  Pulse: 66  64 64  Temp: 97.6 F (36.4 C)  97.6 F (36.4 C) 97.4 F (36.3 C)  TempSrc: Oral  Oral Oral  Resp: 17  17 16   Weight:  66.906 kg (147 lb 8 oz)    SpO2: 96%  94% 93%   Physical Exam General: sitting in bed, NAD Heart: RRR Lungs: clear without crackles or wheezes Abdomen: obese soft NT Extremities: no LE edema Dialysis Access: right thigh graft   Dialysis: [facility] at [facility]  3:45hrs F160 400/1.5 66.5kg 2/2.5 Bath Heparin none R thigh AVG  Hectorol 1ug Epo 13000 Venofer none  Last labs: tsat 31% (nov) ferritin 1750 (july) phos 4.0 pth 434 (sept)  Coming off under edw by 1 kg and wt gains average 3.5-4 kg   Assessment/Plan:  1. Pul edema +/- PNA - afebrile > 24 hr sats ok on room air on empiric Vanc and zosyn - change to po antibiotics ? 2. ESRD - TTS s/p renal/pancreas tx with med taper - no change this adm - still on prograf/pred; repeat labs pre HD later today- lowering edw  3. Anemia - Hgb 11.3 > 9.5 - no ESA given yet this adm -  - resume ARanesp  100 12/11 4. Secondary hyperparathyroidism - 2.5 Ca bath - resume hectorol 1 and phoslo - Ca on the low side - repeat pre HD P low 1.7 decrease to 1 phoslo ac - intake here not like outpt diet; also has had issues with low P before - check labs pre HD today 5. HTN/volume - continue same meds exforge equivalent and metoprolol 100 controlled; UF 3.8 yesterday - pre and post HD weights don't make sense so difficult to know true edw - will need lower EDW at d/c  6. Nutrition - renal diet + multivit  7. DM - difficult to manage - per primary BS labile; see Diabetic coord note - lantus changed to BID + SSI 8. Colitis/ lower GIB - recent adm - no heparin HD  9.  Thrombocytopenia - plts dropped to 106 K - follow - repeat Tues up to 111K -repeat pre Hd today 10. N and V - has prn zofran; pt vague about what precipitates this.  Sheffield Slider, PA-C [facility] Beeper [phone] 05/07/2013,8:39 AM  LOS: 4 days    Additional Objective Labs: Basic Metabolic Panel:  Recent Labs Lab 05/05/13 1145  05/07/13 0145 05/07/13 0247 05/07/13 0718  NA 129*  < > 125* 122* 121*  K 4.0  < > 5.0 5.5* 5.2*  CL 89*  < > 84* 85* 84*  CO2 23  < > 23 19 17*  GLUCOSE 198*  < > 239* 252* 426*  BUN 47*  < > 34* 35* 39*  CREATININE 7.71*  < > 6.10* 6.19* 6.43*  CALCIUM 7.7*  < > 7.8* 7.5* 7.5*  PHOS 1.7*  --   --   --   --   < > = values in this interval not displayed. Liver Function Tests:  Recent Labs Lab 05/03/13 0653 05/05/13 1145  AST 21  --   ALT 9  --   ALKPHOS 199*  --   BILITOT 0.5  --   PROT 7.7  --  ALBUMIN 3.5 2.5*   CBC:  Recent Labs Lab 05/03/13 0653 05/04/13 0555 05/05/13 1145  WBC 10.0 9.1 8.6  NEUTROABS 8.5*  --   --   HGB 13.1 11.3* 9.5*  HCT 42.0 36.2 30.5*  MCV 105.3* 105.2* 102.7*  PLT 167 106* 111*   Blood Culture    Component Value Date/Time   SDES BLOOD HEMODIALYSIS LINE 05/03/2013 1450   SPECREQUEST BOTTLES DRAWN AEROBIC AND ANAEROBIC 10CC 05/03/2013 1450   CULT  Value:        BLOOD CULTURE RECEIVED NO GROWTH TO DATE CULTURE WILL BE HELD FOR 5 DAYS BEFORE ISSUING A FINAL NEGATIVE REPORT Performed at [facility] 05/03/2013 1450   REPTSTATUS PENDING 05/03/2013 1450   CBG:  Recent Labs Lab 05/06/13 2058 05/06/13 2302 05/07/13 0103 05/07/13 0313 05/07/13 0546  GLUCAP 143* 213* 252* 258* 368*   Studies/Results: Dg Chest 2 View  05/06/2013   CLINICAL DATA:  The patient was told she had pneumonia 2 days ago. The patient is on antibiotics.  EXAM: CHEST  2 VIEW  COMPARISON:  05/03/2013  FINDINGS: The heart is enlarged. There has been improvement in aeration of the right lung. Persistent  infiltrates are identified at the lung bases bilaterally, left greater than right. Findings favor infectious infiltrate over edema. Surgical clips are identified in the right upper quadrant of the abdomen.  IMPRESSION: 1. Cardiomegaly without definite evidence for pulmonary edema. 2. Improving aeration with persistent bilateral lower lobe infiltrates.   Electronically Signed   By: Rosalie Gums M.D.   On: 05/06/2013 19:33   Medications: . sodium chloride     . amLODipine  10 mg Oral Daily  . calcium acetate  667 mg Oral TID WC  . clonazePAM  1 mg Oral QHS  . darbepoetin (ARANESP) injection - DIALYSIS  100 mcg Intravenous Q Thu-HD  . doxercalciferol  1 mcg Intravenous Q T,Th,Sa-HD  . feeding supplement (NEPRO CARB STEADY)  237 mL Oral Q24H  . gabapentin  100 mg Oral Daily  . heparin  5,000 Units Subcutaneous Q8H  . insulin glargine  5 Units Subcutaneous BID  . irbesartan  150 mg Oral Daily  . metoprolol succinate  100 mg Oral QHS  . montelukast  10 mg Oral QHS  . multivitamin  1 tablet Oral QHS  . pantoprazole  40 mg Oral QHS  . piperacillin-tazobactam (ZOSYN)  IV  2.25 g Intravenous Q8H  . predniSONE  5 mg Oral Daily  . sodium chloride  3 mL Intravenous Q12H  . tacrolimus  1 mg Oral BID  . vancomycin  750 mg Intravenous Q T,Th,Sa-HD

## 2013-05-07 NOTE — Progress Notes (Signed)
ANTIBIOTIC CONSULT NOTE - FOLLOW UP  Pharmacy Consult for Levaquin Indication: HCAP  Allergies  Allergen Reactions  . Depakote [Divalproex Sodium] Other (See Comments)    Pt gets "delirious"  . Morphine And Related Nausea And Vomiting and Other (See Comments)    "makes me delirious"  . Penicillins Anaphylaxis  . Tramadol Nausea And Vomiting  . Vicodin [Hydrocodone-Acetaminophen] Itching and Rash    Patient Measurements: Height: 5\' 1"  (154.9 cm) Weight: 151 lb 10.8 oz (68.8 kg) (standing wt) IBW/kg (Calculated) : 47.8  Vital Signs: Temp: 97.4 F (36.3 C) (12/11 1342) Temp src: Oral (12/11 1342) BP: 74/35 mmHg (12/11 1500) Pulse Rate: 64 (12/11 1500)  Labs:  Recent Labs  05/05/13 1145  05/07/13 0718 05/07/13 1240 05/07/13 1401  WBC 8.6  --   --   --  12.1*  HGB 9.5*  --   --   --  10.9*  PLT 111*  --   --   --  159  CREATININE 7.71*  < > 6.43* 6.83*  7.04* 7.00*  < > = values in this interval not displayed. Estimated Creatinine Clearance: 9.5 ml/min (by C-G formula based on Cr of 7).   ESRD  Assessment:  Day # 5 Vancomycin and Zosyn.    Change to oral Levaquin today.  Goal of Therapy:  appropriate Levaquin dose for renal function and infection  Plan:   Levaquin 500 mg PO q48hrs.  Dennie Fetters, [place] Pager: [phone] 05/07/2013,3:07 PM

## 2013-05-07 NOTE — Progress Notes (Signed)
I have personally seen and examined this patient and agree with the assessment/plan as outlined above by [employer] PA. Eating sun-chips at this time.  Yosselyn Tax K.,MD 05/07/2013 11:08 AM

## 2013-05-08 LAB — BASIC METABOLIC PANEL
BUN: 19 mg/dL (ref 6–23)
CO2: 25 mEq/L (ref 19–32)
CO2: 27 mEq/L (ref 19–32)
Calcium: 8.1 mg/dL — ABNORMAL LOW (ref 8.4–10.5)
Chloride: 93 mEq/L — ABNORMAL LOW (ref 96–112)
Creatinine, Ser: 4.19 mg/dL — ABNORMAL HIGH (ref 0.50–1.10)
Creatinine, Ser: 4.7 mg/dL — ABNORMAL HIGH (ref 0.50–1.10)
GFR calc Af Amer: 12 mL/min — ABNORMAL LOW (ref >90)
GFR calc non Af Amer: 12 mL/min — ABNORMAL LOW (ref >90)
Glucose, Bld: 393 mg/dL — ABNORMAL HIGH (ref 70–99)
Glucose, Bld: 61 mg/dL — ABNORMAL LOW (ref 70–99)
Potassium: 4.4 mEq/L (ref 3.5–5.1)
Sodium: 128 mEq/L — ABNORMAL LOW (ref 135–145)
Sodium: 131 mEq/L — ABNORMAL LOW (ref 135–145)

## 2013-05-08 LAB — GLUCOSE, CAPILLARY
Glucose-Capillary: 65 mg/dL — ABNORMAL LOW (ref 70–99)
Glucose-Capillary: 91 mg/dL (ref 70–99)
Glucose-Capillary: 498 mg/dL — ABNORMAL HIGH (ref 70–99)

## 2013-05-08 MED ORDER — INSULIN ASPART 100 UNIT/ML ~~LOC~~ SOLN
0.0000 [IU] | Freq: Three times a day (TID) | SUBCUTANEOUS | Status: DC
  Administered 2013-05-08: 2 [IU] via SUBCUTANEOUS
  Administered 2013-05-09: 1 [IU] via SUBCUTANEOUS
  Administered 2013-05-09 – 2013-05-10 (×2): 5 [IU] via SUBCUTANEOUS

## 2013-05-08 MED ORDER — INSULIN ASPART 100 UNIT/ML ~~LOC~~ SOLN
7.0000 [IU] | Freq: Once | SUBCUTANEOUS | Status: AC
  Administered 2013-05-08: 7 [IU] via SUBCUTANEOUS

## 2013-05-08 MED ORDER — INSULIN ASPART 100 UNIT/ML ~~LOC~~ SOLN
4.0000 [IU] | Freq: Once | SUBCUTANEOUS | Status: AC
  Administered 2013-05-08: 4 [IU] via SUBCUTANEOUS

## 2013-05-08 MED ORDER — HYDROXYZINE HCL 10 MG PO TABS
10.0000 mg | ORAL_TABLET | Freq: Once | ORAL | Status: AC
  Administered 2013-05-08: 10 mg via ORAL
  Filled 2013-05-08: qty 1

## 2013-05-08 MED ORDER — INSULIN ASPART 100 UNIT/ML ~~LOC~~ SOLN
6.0000 [IU] | Freq: Once | SUBCUTANEOUS | Status: AC
  Administered 2013-05-08: 6 [IU] via SUBCUTANEOUS

## 2013-05-08 NOTE — Progress Notes (Signed)
Pt applied Johnson's baby lotion (from home) on face. Face became itchy, no respiratory distress noted. Per pt, refuses to take benedryl. Called [facility]. New orders placed. Will continue to monitor. [name]

## 2013-05-08 NOTE — Progress Notes (Signed)
Patient ID: [name], female   DOB: 22-Sep-1970, 42 y.o.   MRN: [id number]  [facility] Progress Note   Assessment/ Plan:   1. Pul edema +/- PNA - afebrile and converted to PO levaquin yesterday from IV ABx  2. ESRD - Tolerated HD well yesterday. S/p SKP transplant with renal allograft failure- on Prograf/Pred. Next HD tomorrow if still here.  3. Anemia -Hgb at goal- on ESA. No overt losses 4. Secondary hyperparathyroidism - binders adjusted for low phosphorus 5. HTN/volume - Bp well controlled, no volume excess noted clinically 6. Nutrition - renal diet + multivit  7. DM - difficult to manage - per primary BS labile; see Diabetic coord note - lantus changed to BID + SSI  8. Colitis/ lower GIB - recent adm - no heparin HD  9. Diarrhea: will check stool for C dfifficile   Subjective:   Reports to having headache and diarrhea overnight. No further nausea/vomiting    Objective:   BP 115/56  Pulse 73  Temp(Src) 99.1 F (37.3 C) (Oral)  Resp 18  Ht 5\' 1"  (1.549 m)  Wt 68.312 kg (150 lb 9.6 oz)  BMI 28.47 kg/m2  SpO2 97%  LMP 04/12/2013  Physical Exam: WUJ:[id number] sitting up in bed talking on phone and eating [employer] AOZ:HYQMV RRR, normal S1 and S2  Resp:CTA bilaterally, no rales/rhonchi HQI:ONGE, obese, NT, BS normal Ext:No LE edema  Labs: BMET  Recent Labs Lab 05/05/13 1145  05/07/13 0718 05/07/13 1240 05/07/13 1401 05/07/13 1940 05/07/13 2240 05/08/13 0245 05/08/13 0845  NA 129*  < > 121* 127*  129* 128* 133* 129* 128* 131*  K 4.0  < > 5.2* 5.0  4.0 4.1 4.3 4.5 4.4 4.3  CL 89*  < > 84* 87*  89* 88* 94* 90* 91* 93*  CO2 23  < > 17* 22  21 22 28 26 25 27   GLUCOSE 198*  < > 426* 103*  45* 45* 159* 449* 393* 61*  BUN 47*  < > 39* 40*  41* 41* 11 14 19  24*  CREATININE 7.71*  < > 6.43* 6.83*  7.04* 7.00* 3.19* 3.57* 4.19* 4.70*  CALCIUM 7.7*  < > 7.5* 7.7*  7.5* 7.6* 8.3* 7.6* 7.4* 8.1*  PHOS 1.7*  --   --   --  4.0  --   --   --   --    < > = values in this interval not displayed. CBC  Recent Labs Lab 05/03/13 0653 05/04/13 0555 05/05/13 1145 05/07/13 1401  WBC 10.0 9.1 8.6 12.1*  NEUTROABS 8.5*  --   --   --   HGB 13.1 11.3* 9.5* 10.9*  HCT 42.0 36.2 30.5* 33.9*  MCV 105.3* 105.2* 102.7* 99.4  PLT 167 106* 111* 159   Medications:    . amLODipine  10 mg Oral Daily  . calcium acetate  667 mg Oral TID WC  . clonazePAM  1 mg Oral QHS  . darbepoetin (ARANESP) injection - DIALYSIS  100 mcg Intravenous Q Thu-HD  . doxercalciferol  1 mcg Intravenous Q T,Th,Sa-HD  . feeding supplement (NEPRO CARB STEADY)  237 mL Oral Q24H  . gabapentin  100 mg Oral Daily  . heparin  5,000 Units Subcutaneous Q8H  . insulin aspart  0-9 Units Subcutaneous TID WC  . insulin glargine  5 Units Subcutaneous BID  . irbesartan  150 mg Oral Daily  . levofloxacin  500 mg Oral Q48H  . metoprolol succinate  100 mg  Oral QHS  . montelukast  10 mg Oral QHS  . multivitamin  1 tablet Oral QHS  . pantoprazole  40 mg Oral QHS  . predniSONE  5 mg Oral Daily  . sodium chloride  3 mL Intravenous Q12H  . tacrolimus  1 mg Oral BID   Zetta Bills, MD 05/08/2013, 11:16 AM

## 2013-05-08 NOTE — Progress Notes (Signed)
[facility] PROGRESS NOTE  [name] NWG:[id number] DOB: 14-Aug-1970 DOA: 05/03/2013 PCP: [name], MD  Brief narrative  42 year old female with history of incision and disease status post renal transplant now on HD ( Tuesday, Thursday and Saturday), uncontrolled type 1diabetes mellitus, hypertension, hyperlipidemia, anemia, depression, COPD with recurrent hospitalization several times with issues of uncontrolled DM presented with acute respiratory failure due to fluid overload from missing dialysis and hypoglycemia. Initially she was placed on BiPAP and received D50 and was then taken for urgent dialysis for which her breathing improved. Patient febrile with possible infiltrate on CXR. She was started on broad spectrum antibiotics for the management of health care associated pneumonia. Repeat CXR on 12/10 showed improvement in the aeration, without any worsening of the pneumonia. She has been afebrile and we will start de escalating antibiotics.  Assessment/Plan:  Acute encephalopathy  due to combination of hypoxic acute respiratory failure and metabolic encephalopathy from hypoglycemia/ hyperglycemia -Hypoxic respiratory failure secondary to missed dialysis and volume overload and hcap. Symptoms improved after urgent hemodialysis.  -admission CXR CHF versus pneumonia.  -patient also reports that her aunt noticed her to have seizure like activity during the initial episode. This has happened to her once before last month. Could be hypoglycemia seizure. Will obtain EEG, which showed mod to severe encephalopathic changes and no epileptiform activity.   Uncontrolled type 1 DM  Likely in the setting of missed dialysis and continuing to take long-acting insulin. Hypoglycemia resolved but now has elevated blood glucose. Received short acting insulin which made her fsg to drop later in the afternoon.  . -Her last A1c was 10.  - on further discussing with the patient , she  reports she takes 6 units of lantus twice a day, I have discussed the fact that her home insulin dose reflects that she is on daily dose. Pt insists that she takes twice daily dosing. I have made the appropriate changes in the medications. On 12/10, pt 's cbg were in 1100's, she was put on glucose stabilizer and she became hypoglycemic with cbg's in low 30's. She was taken off the glucose stabilizer and was put on lantus 5 units BID, with 2 units TIDAC and sensitive SSI. She is also non compliant to diet. She is eating chips and drinking sodas.  CBG (last 3)   Recent Labs  05/08/13 0740 05/08/13 1023 05/08/13 1124  GLUCAP 349* 65* 91      Healthcare associated versus aspiration pneumonia  Patient had infiltrate on chest x-ray with fever while on dialysis. Started on empiric vancomycin and Zosyn. Cultures have been negative so far. Repeat CXR on 12/10 showed improved aeration, did not show worsening infiltrates.  She is afebrile and does not have leukocytosis and she appears non toxic.  We have de escalete the antibiotics to oral antibiotics.   ESRD on HD  . Dialysis per renal. SW consulted for transportation needs.  -Continue PhosLo and hectoral   Status post kidney-pancreatic transplant.  Continue home transplant medications along with prednisone.   Hypertension.  Poorly controlled upon presentation. Now improved with dialysis. Resume all medications and when necessary hydralazine   . GERD  continue PPI.   Migraine headaches  Resume home medications. increased imitrex to 50 mg BID prn.   DVT Prophylaxis : Subcutaneous Heparin  Diet: Diabetic   Code Status: Full code   Family Communication: None at bedside   Disposition Plan: Home  Once blood glucose remains stable   Consultants:  Renal  Procedures:  Hemodialysis  Antibiotics:  IV vancomycin and Zosyn (12/ 7--) to 12/11 levaquin 12/11  HPI/Subjective:  Persistent headaches improved a little with pain meds.    Objective: Filed Vitals:   05/08/13 1300  BP: 92/44  Pulse: 73  Temp: 98.6 F (37 C)  Resp: 18    Intake/Output Summary (Last 24 hours) at 05/08/13 1433 Last data filed at 05/08/13 1300  Gross per 24 hour  Intake    390 ml  Output   2787 ml  Net  -2397 ml   Filed Weights   05/06/13 2204 05/07/13 1342 05/07/13 2032  Weight: 66.906 kg (147 lb 8 oz) 68.8 kg (151 lb 10.8 oz) 68.312 kg (150 lb 9.6 oz)    Exam:  General: Middle aged female in  No distress.  HEENT: No pallor, no icterus, moist oral mucosa  Chest: Right lower lobe crackles, no rhonchi or wheeze  CVS: Normal S1-S2, no murmurs rub or gallop  Abdomen: Soft, nontender, nondistended, bowel sounds present  Extremities: Warm, no edema  CNS: AAO x3   Data Reviewed: Basic Metabolic Panel:  Recent Labs Lab 05/05/13 1145  05/07/13 1401 05/07/13 1940 05/07/13 2240 05/08/13 0245 05/08/13 0845  NA 129*  < > 128* 133* 129* 128* 131*  K 4.0  < > 4.1 4.3 4.5 4.4 4.3  CL 89*  < > 88* 94* 90* 91* 93*  CO2 23  < > 22 28 26 25 27   GLUCOSE 198*  < > 45* 159* 449* 393* 61*  BUN 47*  < > 41* 11 14 19  24*  CREATININE 7.71*  < > 7.00* 3.19* 3.57* 4.19* 4.70*  CALCIUM 7.7*  < > 7.6* 8.3* 7.6* 7.4* 8.1*  PHOS 1.7*  --  4.0  --   --   --   --   < > = values in this interval not displayed. Liver Function Tests:  Recent Labs Lab 05/03/13 0653 05/05/13 1145 05/07/13 1401  AST 21  --   --   ALT 9  --   --   ALKPHOS 199*  --   --   BILITOT 0.5  --   --   PROT 7.7  --   --   ALBUMIN 3.5 2.5* 3.1*   No results found for this basename: LIPASE, AMYLASE,  in the last 168 hours No results found for this basename: AMMONIA,  in the last 168 hours CBC:  Recent Labs Lab 05/03/13 0653 05/04/13 0555 05/05/13 1145 05/07/13 1401  WBC 10.0 9.1 8.6 12.1*  NEUTROABS 8.5*  --   --   --   HGB 13.1 11.3* 9.5* 10.9*  HCT 42.0 36.2 30.5* 33.9*  MCV 105.3* 105.2* 102.7* 99.4  PLT 167 106* 111* 159   Cardiac Enzymes:  Recent  Labs Lab 05/03/13 0653  TROPONINI <0.30   BNP (last 3 results)  Recent Labs  05/03/13 0653  PROBNP 33341.0*   CBG:  Recent Labs Lab 05/08/13 0223 05/08/13 0424 05/08/13 0740 05/08/13 1023 05/08/13 1124  GLUCAP 401* 164* 349* 65* 91    Recent Results (from the past 240 hour(s))  CULTURE, BLOOD (ROUTINE X 2)     Status: None   Collection Time    05/03/13  2:35 PM      Result Value Range Status   Specimen Description BLOOD HEMODIALYSIS LINE   Final   Special Requests BOTTLES DRAWN AEROBIC AND ANAEROBIC 10CC   Final   Culture  Setup Time     Final  Value: 05/04/2013 00:10     Performed at [facility]   Culture     Final   Value:        BLOOD CULTURE RECEIVED NO GROWTH TO DATE CULTURE WILL BE HELD FOR 5 DAYS BEFORE ISSUING A FINAL NEGATIVE REPORT     Performed at [facility]   Report Status PENDING   Incomplete  CULTURE, BLOOD (ROUTINE X 2)     Status: None   Collection Time    05/03/13  2:50 PM      Result Value Range Status   Specimen Description BLOOD HEMODIALYSIS LINE   Final   Special Requests BOTTLES DRAWN AEROBIC AND ANAEROBIC 10CC   Final   Culture  Setup Time     Final   Value: 05/04/2013 00:10     Performed at [facility]   Culture     Final   Value:        BLOOD CULTURE RECEIVED NO GROWTH TO DATE CULTURE WILL BE HELD FOR 5 DAYS BEFORE ISSUING A FINAL NEGATIVE REPORT     Performed at [facility]   Report Status PENDING   Incomplete     Studies: Dg Chest 2 View  05/06/2013   CLINICAL DATA:  The patient was told she had pneumonia 2 days ago. The patient is on antibiotics.  EXAM: CHEST  2 VIEW  COMPARISON:  05/03/2013  FINDINGS: The heart is enlarged. There has been improvement in aeration of the right lung. Persistent infiltrates are identified at the lung bases bilaterally, left greater than right. Findings favor infectious infiltrate over edema. Surgical clips are identified in the right upper quadrant of the abdomen.   IMPRESSION: 1. Cardiomegaly without definite evidence for pulmonary edema. 2. Improving aeration with persistent bilateral lower lobe infiltrates.   Electronically Signed   By: Rosalie Gums M.D.   On: 05/06/2013 19:33    Scheduled Meds: . amLODipine  10 mg Oral Daily  . calcium acetate  667 mg Oral TID WC  . clonazePAM  1 mg Oral QHS  . darbepoetin (ARANESP) injection - DIALYSIS  100 mcg Intravenous Q Thu-HD  . doxercalciferol  1 mcg Intravenous Q T,Th,Sa-HD  . feeding supplement (NEPRO CARB STEADY)  237 mL Oral Q24H  . gabapentin  100 mg Oral Daily  . heparin  5,000 Units Subcutaneous Q8H  . insulin aspart  0-9 Units Subcutaneous TID WC  . insulin glargine  5 Units Subcutaneous BID  . irbesartan  150 mg Oral Daily  . levofloxacin  500 mg Oral Q48H  . metoprolol succinate  100 mg Oral QHS  . montelukast  10 mg Oral QHS  . multivitamin  1 tablet Oral QHS  . pantoprazole  40 mg Oral QHS  . predniSONE  5 mg Oral Daily  . sodium chloride  3 mL Intravenous Q12H  . tacrolimus  1 mg Oral BID   Continuous Infusions: . sodium chloride        Time spent: 25 minutes    [name]  [facility] Pager [phone]. If 7PM-7AM, please contact night-coverage at [email or web address], password [facility] 05/08/2013, 2:33 PM  LOS: 5 days

## 2013-05-08 NOTE — Progress Notes (Addendum)
Follow up CBG results  = 401. Pt resting eyes at this time, no distress noted, asymptomatic. Benedetto Coons notified. New orders place. Will continue to monitor. New follow up cbg was 164.  [name]

## 2013-05-08 NOTE — Progress Notes (Signed)
Pt has cbg of 531, asymptomatic at this time. Benedetto Coons notified. New orders placed. Per Benedetto Coons, monitor her CBGs closely tonight to see if her blood sugar drops rapidly. Will continue to monitor. [name]

## 2013-05-08 NOTE — Progress Notes (Signed)
Inpatient Diabetes Program Recommendations  AACE/ADA: New Consensus Statement on Inpatient Glycemic Control (2013)  Target Ranges:  Prepandial:   less than 140 mg/dL      Peak postprandial:   less than 180 mg/dL (1-2 hours)      Critically ill patients:  140 - 180 mg/dL   Reason for Visit: Results for [name], [name] (MRN [id number]) as of 05/08/2013 13:45  Ref. Range 05/07/2013 18:25 05/07/2013 20:27 05/08/2013 00:17 05/08/2013 02:23 05/08/2013 04:24 05/08/2013 07:40 05/08/2013 10:23 05/08/2013 11:24  Glucose-Capillary Latest Range: 70-99 mg/dL 91 086 (H) 578 (H) 469 (H) 164 (H) 349 (H) 65 (L) 91   CBG's labile.  Please decrease Novolog correction to more sensitive scale.  Consider a Novolog custom scale: 150-200 mg/dL-1 unit, 629-528 mg/dL-2 units, 413-244 mg/dL-3 units, 010-272 mg/dL-4 units, 536-644 mg/dL-5 units, and 034-742 mg/dL- 6 units. This may help prevent extreme highs and lows in CBG values.  Thanks, Beryl Meager, RN, BC-ADM Inpatient Diabetes Coordinator Pager [phone]

## 2013-05-09 LAB — CBC
HCT: 33.8 % — ABNORMAL LOW (ref 36.0–46.0)
Hemoglobin: 11.1 g/dL — ABNORMAL LOW (ref 12.0–15.0)
MCHC: 32.8 g/dL (ref 30.0–36.0)
MCV: 99.4 fL (ref 78.0–100.0)
RDW: 14.7 % (ref 11.5–15.5)
WBC: 7 10*3/uL (ref 4.0–10.5)

## 2013-05-09 LAB — RENAL FUNCTION PANEL
BUN: 40 mg/dL — ABNORMAL HIGH (ref 6–23)
Calcium: 7.8 mg/dL — ABNORMAL LOW (ref 8.4–10.5)
Chloride: 91 mEq/L — ABNORMAL LOW (ref 96–112)
Glucose, Bld: 214 mg/dL — ABNORMAL HIGH (ref 70–99)
Phosphorus: 2.9 mg/dL (ref 2.3–4.6)
Potassium: 4.1 mEq/L (ref 3.5–5.1)

## 2013-05-09 LAB — CLOSTRIDIUM DIFFICILE BY PCR: Toxigenic C. Difficile by PCR: NEGATIVE

## 2013-05-09 LAB — BASIC METABOLIC PANEL
BUN: 16 mg/dL (ref 6–23)
CO2: 25 mEq/L (ref 19–32)
Calcium: 7.9 mg/dL — ABNORMAL LOW (ref 8.4–10.5)
Creatinine, Ser: 3.64 mg/dL — ABNORMAL HIGH (ref 0.50–1.10)
GFR calc Af Amer: 17 mL/min — ABNORMAL LOW (ref >90)
GFR calc non Af Amer: 15 mL/min — ABNORMAL LOW (ref >90)
Sodium: 127 mEq/L — ABNORMAL LOW (ref 135–145)

## 2013-05-09 LAB — GLUCOSE, CAPILLARY
Glucose-Capillary: 224 mg/dL — ABNORMAL HIGH (ref 70–99)
Glucose-Capillary: 166 mg/dL — ABNORMAL HIGH (ref 70–99)
Glucose-Capillary: >600 mg/dL (ref 70–99)
Glucose-Capillary: >600 mg/dL (ref 70–99)

## 2013-05-09 MED ORDER — LEVOFLOXACIN 500 MG PO TABS: 500.0000 mg | ORAL_TABLET | ORAL | Status: DC

## 2013-05-09 MED ORDER — TACROLIMUS 0.5 MG PO CAPS: 0.5000 mg | ORAL_CAPSULE | Freq: Every day | ORAL | Status: AC

## 2013-05-09 MED ORDER — HYDROMORPHONE HCL PF 1 MG/ML IJ SOLN
INTRAMUSCULAR | Status: AC
  Administered 2013-05-09: 1 mg
  Filled 2013-05-09: qty 1

## 2013-05-09 MED ORDER — TACROLIMUS 1 MG PO CAPS: 1.0000 mg | ORAL_CAPSULE | Freq: Every day | ORAL | Status: AC

## 2013-05-09 MED ORDER — SUMATRIPTAN SUCCINATE 50 MG PO TABS: 50.0000 mg | ORAL_TABLET | Freq: Two times a day (BID) | ORAL | Status: DC | PRN

## 2013-05-09 MED ORDER — INSULIN GLARGINE 100 UNIT/ML ~~LOC~~ SOLN: 5.0000 [IU] | Freq: Two times a day (BID) | SUBCUTANEOUS | Status: DC

## 2013-05-09 MED ORDER — TACROLIMUS 0.5 MG PO CAPS: 0.5000 mg | ORAL_CAPSULE | Freq: Every day | ORAL | Status: DC

## 2013-05-09 MED ORDER — TACROLIMUS 1 MG PO CAPS
1.0000 mg | ORAL_CAPSULE | Freq: Every day | ORAL | Status: DC
  Filled 2013-05-09 (×2): qty 1

## 2013-05-09 MED ORDER — ONDANSETRON HCL 4 MG/2ML IJ SOLN
INTRAMUSCULAR | Status: AC
  Administered 2013-05-09: 4 mg
  Filled 2013-05-09: qty 2

## 2013-05-09 MED ORDER — DOXERCALCIFEROL 4 MCG/2ML IV SOLN
INTRAVENOUS | Status: AC
  Administered 2013-05-09: 1 ug
  Filled 2013-05-09: qty 2

## 2013-05-09 NOTE — Progress Notes (Signed)
Patient asking about when we are going to get her off of the prograf. She is on slow taper of immunosuppressants as outlined by Dr Marisue Humble on recent admission in November (see note from 04/08/13). She has been off Cellcept for 1 month, so today we will start 2-wk prograf taper by reducing dose from 1mg  bid to 1 mg daily for 1 one week, then reducing to 0.5mg  daily for one week then stop.  She should continue to take current dose of prednisone for 3-6 months and if she remains stable the prednisone can then be tapered off.    Vinson Moselle MD  pager [phone]    cell [phone]  05/09/2013, 8:54 AM

## 2013-05-09 NOTE — Progress Notes (Signed)
[facility] PROGRESS NOTE  [name] ZOX:[id number] DOB: 29-Mar-1971 DOA: 05/03/2013 PCP: [name], MD  Brief narrative  42 year old female with history of incision and disease status post renal transplant now on HD ( Tuesday, Thursday and Saturday), uncontrolled type 1diabetes mellitus, hypertension, hyperlipidemia, anemia, depression, COPD with recurrent hospitalization several times with issues of uncontrolled DM presented with acute respiratory failure due to fluid overload from missing dialysis and hypoglycemia. Initially she was placed on BiPAP and received D50 and was then taken for urgent dialysis for which her breathing improved. Patient febrile with possible infiltrate on CXR. She was started on broad spectrum antibiotics for the management of health care associated pneumonia. Repeat CXR on 12/10 showed improvement in the aeration, without any worsening of the pneumonia. She has been afebrile and we will STOP antibiotics.  Assessment/Plan:  Acute encephalopathy  due to combination of hypoxic acute respiratory failure and metabolic encephalopathy from hypoglycemia/ hyperglycemia -Hypoxic respiratory failure secondary to missed dialysis and volume overload and hcap. Symptoms improved after urgent hemodialysis.  -admission CXR CHF versus pneumonia.  -patient also reports that her aunt noticed her to have seizure like activity during the initial episode. This has happened to her once before last month. Could be hypoglycemia seizure. Will obtain EEG, which showed mod to severe encephalopathic changes and no epileptiform activity.   Uncontrolled type 1 DM  Likely in the setting of missed dialysis and continuing to take long-acting insulin. Hypoglycemia resolved but now has elevated blood glucose. Received short acting insulin which made her fsg to drop later in the afternoon.  . -Her last A1c was 10.  - on further discussing with the patient , she reports she takes 6  units of lantus twice a day, I have discussed the fact that her home insulin dose reflects that she is on daily dose. Pt insists that she takes twice daily dosing. I have made the appropriate changes in the medications. On 12/10, pt 's cbg were in 1100's, she was put on glucose stabilizer and she became hypoglycemic with cbg's in low 30's. She was taken off the glucose stabilizer and was put on lantus 5 units BID, with 2 units TIDAC and sensitive SSI. She is also non compliant to diet. She is eating chips and drinking sodas.  CBG (last 3)   Recent Labs  05/09/13 0234 05/09/13 1149 05/09/13 1657  GLUCAP 224* 166* >600*      Healthcare associated versus aspiration pneumonia  Patient had infiltrate on chest x-ray with fever while on dialysis. Started on empiric vancomycin and Zosyn. Cultures have been negative so far. Repeat CXR on 12/10 showed improved aeration, did not show worsening infiltrates.  She is afebrile and does not have leukocytosis and she appears non toxic.  We have de escalete the antibiotics to levaquin. She completed 7 days of antibiotics.   ESRD on HD  . Dialysis per renal. SW consulted for transportation needs.  -Continue PhosLo and hectoral   Status post kidney-pancreatic transplant.  Continue home transplant medications along with prednisone.   Hypertension.  Poorly controlled upon presentation. Now improved with dialysis. Resume all medications and when necessary hydralazine   . GERD  continue PPI.   Migraine headaches  Resume home medications. increased imitrex to 50 mg BID prn.   DVT Prophylaxis : Subcutaneous Heparin  Diet: Diabetic   Code Status: Full code   Family Communication: None at bedside   Disposition Plan: Home  Once blood glucose remains stable   Consultants:  Renal  Procedures:  Hemodialysis  Antibiotics:  IV vancomycin and Zosyn (12/ 7--) to 12/11 levaquin 12/11-12/13  HPI/Subjective:  Persistent headaches improved a little with  pain meds.    Objective: Filed Vitals:   05/09/13 1400  BP: 126/85  Pulse: 84  Temp: 98 F (36.7 C)  Resp: 20    Intake/Output Summary (Last 24 hours) at 05/09/13 1806 Last data filed at 05/09/13 1300  Gross per 24 hour  Intake    480 ml  Output   2962 ml  Net  -2482 ml   Filed Weights   05/08/13 2238 05/09/13 0705 05/09/13 1051  Weight: 72.666 kg (160 lb 3.2 oz) 69.5 kg (153 lb 3.5 oz) 65.5 kg (144 lb 6.4 oz)    Exam:  General: Middle aged female in  No distress.  HEENT: No pallor, no icterus, moist oral mucosa  Chest: Right lower lobe crackles, no rhonchi or wheeze  CVS: Normal S1-S2, no murmurs rub or gallop  Abdomen: Soft, nontender, nondistended, bowel sounds present  Extremities: Warm, no edema  CNS: AAO x3   Data Reviewed: Basic Metabolic Panel:  Recent Labs Lab 05/05/13 1145  05/07/13 1401 05/07/13 1940 05/07/13 2240 05/08/13 0245 05/08/13 0845 05/09/13 0726  NA 129*  < > 128* 133* 129* 128* 131* 130*  K 4.0  < > 4.1 4.3 4.5 4.4 4.3 4.1  CL 89*  < > 88* 94* 90* 91* 93* 91*  CO2 23  < > 22 28 26 25 27 24   GLUCOSE 198*  < > 45* 159* 449* 393* 61* 214*  BUN 47*  < > 41* 11 14 19  24* 40*  CREATININE 7.71*  < > 7.00* 3.19* 3.57* 4.19* 4.70* 6.80*  CALCIUM 7.7*  < > 7.6* 8.3* 7.6* 7.4* 8.1* 7.8*  PHOS 1.7*  --  4.0  --   --   --   --  2.9  < > = values in this interval not displayed. Liver Function Tests:  Recent Labs Lab 05/03/13 0653 05/05/13 1145 05/07/13 1401 05/09/13 0726  AST 21  --   --   --   ALT 9  --   --   --   ALKPHOS 199*  --   --   --   BILITOT 0.5  --   --   --   PROT 7.7  --   --   --   ALBUMIN 3.5 2.5* 3.1* 2.6*   No results found for this basename: LIPASE, AMYLASE,  in the last 168 hours No results found for this basename: AMMONIA,  in the last 168 hours CBC:  Recent Labs Lab 05/03/13 0653 05/04/13 0555 05/05/13 1145 05/07/13 1401 05/09/13 0726  WBC 10.0 9.1 8.6 12.1* 7.0  NEUTROABS 8.5*  --   --   --   --   HGB  13.1 11.3* 9.5* 10.9* 11.1*  HCT 42.0 36.2 30.5* 33.9* 33.8*  MCV 105.3* 105.2* 102.7* 99.4 99.4  PLT 167 106* 111* 159 190   Cardiac Enzymes:  Recent Labs Lab 05/03/13 0653  TROPONINI <0.30   BNP (last 3 results)  Recent Labs  05/03/13 0653  PROBNP 33341.0*   CBG:  Recent Labs Lab 05/08/13 1714 05/08/13 2140 05/09/13 0234 05/09/13 1149 05/09/13 1657  GLUCAP 289* 498* 224* 166* >600*    Recent Results (from the past 240 hour(s))  CULTURE, BLOOD (ROUTINE X 2)     Status: None   Collection Time    05/03/13  2:35 PM  Result Value Range Status   Specimen Description BLOOD HEMODIALYSIS LINE   Final   Special Requests BOTTLES DRAWN AEROBIC AND ANAEROBIC 10CC   Final   Culture  Setup Time     Final   Value: 05/04/2013 00:10     Performed at [facility]   Culture     Final   Value:        BLOOD CULTURE RECEIVED NO GROWTH TO DATE CULTURE WILL BE HELD FOR 5 DAYS BEFORE ISSUING A FINAL NEGATIVE REPORT     Performed at [facility]   Report Status PENDING   Incomplete  CULTURE, BLOOD (ROUTINE X 2)     Status: None   Collection Time    05/03/13  2:50 PM      Result Value Range Status   Specimen Description BLOOD HEMODIALYSIS LINE   Final   Special Requests BOTTLES DRAWN AEROBIC AND ANAEROBIC 10CC   Final   Culture  Setup Time     Final   Value: 05/04/2013 00:10     Performed at [facility]   Culture     Final   Value:        BLOOD CULTURE RECEIVED NO GROWTH TO DATE CULTURE WILL BE HELD FOR 5 DAYS BEFORE ISSUING A FINAL NEGATIVE REPORT     Performed at [facility]   Report Status PENDING   Incomplete  CLOSTRIDIUM DIFFICILE BY PCR     Status: None   Collection Time    05/09/13 12:10 PM      Result Value Range Status   C difficile by pcr NEGATIVE  NEGATIVE Final     Studies: No results found.  Scheduled Meds: . amLODipine  10 mg Oral Daily  . calcium acetate  667 mg Oral TID WC  . clonazePAM  1 mg Oral QHS  . darbepoetin  (ARANESP) injection - DIALYSIS  100 mcg Intravenous Q Thu-HD  . doxercalciferol  1 mcg Intravenous Q T,Th,Sa-HD  . feeding supplement (NEPRO CARB STEADY)  237 mL Oral Q24H  . gabapentin  100 mg Oral Daily  . heparin  5,000 Units Subcutaneous Q8H  . insulin aspart  0-5 Units Subcutaneous TID WC  . insulin glargine  5 Units Subcutaneous BID  . irbesartan  150 mg Oral Daily  . levofloxacin  500 mg Oral Q48H  . metoprolol succinate  100 mg Oral QHS  . montelukast  10 mg Oral QHS  . multivitamin  1 tablet Oral QHS  . pantoprazole  40 mg Oral QHS  . predniSONE  5 mg Oral Daily  . sodium chloride  3 mL Intravenous Q12H  . [START ON 05/16/2013] tacrolimus  0.5 mg Oral QHS  . tacrolimus  1 mg Oral QHS   Continuous Infusions: . sodium chloride        Time spent: 25 minutes    Marsela Kuan  [facility] Pager [phone]. If 7PM-7AM, please contact night-coverage at [email or web address], password [facility] 05/09/2013, 6:06 PM  LOS: 6 days

## 2013-05-09 NOTE — Progress Notes (Signed)
Patient's CBG 498 at 21:44 last night.  Spoke with Benedetto Coons, NP.  Gave 6 units Novolog x one dose per order.  Recheck CBG 224 @ 02:34.  Patient eating candy at bedside.

## 2013-05-09 NOTE — Procedures (Signed)
Patient seen on Hemodialysis. QB 400, UF goal 4L Treatment adjusted as needed.  Zetta Bills MD [facility]. Office # [phone] Pager # [phone] 7:49 AM

## 2013-05-09 NOTE — Progress Notes (Signed)
Patient ID: [name], female   DOB: 12-08-1970, 42 y.o.   MRN: [id number]  [facility] Progress Note   Assessment/ Plan:   1. Pul edema +/- PNA - afebrile on PO levaquin  2. ESRD - HD today per her usual OP schedule 3. Anemia -Hgb at goal- on ESA. No overt losses  4. Secondary hyperparathyroidism - binders adjusted for low phosphorus  5. HTN/volume - BP well controlled, no volume excess noted clinically  6. Nutrition - renal diet + multivit  7. DM - difficult to manage - per primary BS labile; see Diabetic coordinator note - lantus changed to BID + SSI  8. Colitis/ lower GIB - recent adm - no heparin HD  9. Diarrhea: resolved, stool for C diff pending.    Subjective:   Reports to be feeling better this morning with decreased nausea and resolution of diarrhea. Tolerable abdominal pain.   Objective:   BP 138/73  Pulse 78  Temp(Src) 98.3 F (36.8 C) (Oral)  Resp 14  Ht 5\' 1"  (1.549 m)  Wt 74.5 kg (164 lb 3.9 oz)  BMI 31.05 kg/m2  SpO2 98%  LMP 04/12/2013  Physical Exam: NWG:[id number] on HD QIO:NGEXB RRR, normal S1 and S2 Resp:CTA bilaterally, no rales/rhonchi MWU:XLKG, obese, mildly tender over LLQ Ext:No edema  Labs: BMET  Recent Labs Lab 05/05/13 1145  05/07/13 0718 05/07/13 1240 05/07/13 1401 05/07/13 1940 05/07/13 2240 05/08/13 0245 05/08/13 0845  NA 129*  < > 121* 127*  129* 128* 133* 129* 128* 131*  K 4.0  < > 5.2* 5.0  4.0 4.1 4.3 4.5 4.4 4.3  CL 89*  < > 84* 87*  89* 88* 94* 90* 91* 93*  CO2 23  < > 17* 22  21 22 28 26 25 27   GLUCOSE 198*  < > 426* 103*  45* 45* 159* 449* 393* 61*  BUN 47*  < > 39* 40*  41* 41* 11 14 19  24*  CREATININE 7.71*  < > 6.43* 6.83*  7.04* 7.00* 3.19* 3.57* 4.19* 4.70*  CALCIUM 7.7*  < > 7.5* 7.7*  7.5* 7.6* 8.3* 7.6* 7.4* 8.1*  PHOS 1.7*  --   --   --  4.0  --   --   --   --   < > = values in this interval not displayed. CBC  Recent Labs Lab 05/03/13 0653 05/04/13 0555 05/05/13 1145  05/07/13 1401  WBC 10.0 9.1 8.6 12.1*  NEUTROABS 8.5*  --   --   --   HGB 13.1 11.3* 9.5* 10.9*  HCT 42.0 36.2 30.5* 33.9*  MCV 105.3* 105.2* 102.7* 99.4  PLT 167 106* 111* 159   Medications:    . amLODipine  10 mg Oral Daily  . calcium acetate  667 mg Oral TID WC  . clonazePAM  1 mg Oral QHS  . darbepoetin (ARANESP) injection - DIALYSIS  100 mcg Intravenous Q Thu-HD  . doxercalciferol  1 mcg Intravenous Q T,Th,Sa-HD  . feeding supplement (NEPRO CARB STEADY)  237 mL Oral Q24H  . gabapentin  100 mg Oral Daily  . heparin  5,000 Units Subcutaneous Q8H  . insulin aspart  0-5 Units Subcutaneous TID WC  . insulin glargine  5 Units Subcutaneous BID  . irbesartan  150 mg Oral Daily  . levofloxacin  500 mg Oral Q48H  . metoprolol succinate  100 mg Oral QHS  . montelukast  10 mg Oral QHS  . multivitamin  1 tablet Oral QHS  .  pantoprazole  40 mg Oral QHS  . predniSONE  5 mg Oral Daily  . sodium chloride  3 mL Intravenous Q12H  . tacrolimus  1 mg Oral BID   Zetta Bills, MD 05/09/2013, 7:45 AM

## 2013-05-10 LAB — GLUCOSE, CAPILLARY
Glucose-Capillary: 44 mg/dL — CL (ref 70–99)
Glucose-Capillary: 88 mg/dL (ref 70–99)

## 2013-05-10 LAB — CULTURE, BLOOD (ROUTINE X 2): Culture: NO GROWTH

## 2013-05-10 MED ORDER — DEXTROSE 5 % IV SOLN: INTRAVENOUS | Status: DC

## 2013-05-10 MED ORDER — DEXTROSE 50 % IV SOLN: 25.0000 mL | Freq: Once | INTRAVENOUS | Status: DC | PRN

## 2013-05-10 MED ORDER — INSULIN ASPART 100 UNIT/ML ~~LOC~~ SOLN: 0.0000 [IU] | Freq: Three times a day (TID) | SUBCUTANEOUS | Status: DC

## 2013-05-10 MED ORDER — CALCIUM ACETATE 667 MG PO CAPS: 667.0000 mg | ORAL_CAPSULE | Freq: Three times a day (TID) | ORAL | Status: DC

## 2013-05-10 NOTE — Progress Notes (Signed)
Hypoglycemic Event  CBG: 44  Treatment:  2 cups of apple juice and ia pack of graham cracker,refused glucagon,refused gluco gel  Symptoms: none Follow-up CBG: Time:1159 CBG Result: 88  Possible Reasons for Event: unknown  Comments/MD notified yes    Isam Unrein, Kem Kays  Remember to initiate Hypoglycemia Order Set & complete

## 2013-05-10 NOTE — Progress Notes (Signed)
New discharge papers with sliding scale insulin explained and given to the patient plus new prescription.

## 2013-05-10 NOTE — Progress Notes (Signed)
Patient ID: [name], female   DOB: 11/18/1970, 42 y.o.   MRN: [id number]  [facility] Progress Note   Assessment/ Plan:   1. Pul edema +/- PNA - afebrile on PO levaquin, for d/c today 2. ESRD - resume usual HD as outpatient 3. Anemia -Hgb at goal- on ESA. No overt losses  4. Secondary hyperparathyroidism - binders adjusted for low phosphorus  5. HTN/volume - BP well controlled, no volume excess noted clinically  6. Nutrition - renal diet + multivit  7. DM - difficult to manage - per primary BS labile; see Diabetic coordinator note - lantus changed to BID + SSI  8. Colitis/ lower GIB - recent adm - no heparin HD  9. Diarrhea: resolved 10. Immunosuppression: prograf taper started and I went over again in detail with patient today, last dose of prograf should be 12/26  Vinson Moselle MD (pgr) [phone]    ([phone] 05/10/2013, 10:53 AM      Subjective:   Reports to be feeling better this morning with decreased nausea and resolution of diarrhea. Tolerable abdominal pain.   Objective:   BP 136/65  Pulse 77  Temp(Src) 97.9 F (36.6 C) (Oral)  Resp 18  Ht 5\' 1"  (1.549 m)  Wt 68.266 kg (150 lb 8 oz)  BMI 28.45 kg/m2  SpO2 95%  LMP 04/12/2013  Physical Exam: UUV:OZDGUYQIHKV on HD QQV:ZDGLO RRR, normal S1 and S2 Resp:CTA bilaterally, no rales/rhonchi VFI:EPPI, obese, mildly tender over LLQ Ext:No edema  Labs: BMET  Recent Labs Lab 05/05/13 1145  05/07/13 1401 05/07/13 1940 05/07/13 2240 05/08/13 0245 05/08/13 0845 05/09/13 0726 05/09/13 2000  NA 129*  < > 128* 133* 129* 128* 131* 130* 127*  K 4.0  < > 4.1 4.3 4.5 4.4 4.3 4.1 4.3  CL 89*  < > 88* 94* 90* 91* 93* 91* 90*  CO2 23  < > 22 28 26 25 27 24 25   GLUCOSE 198*  < > 45* 159* 449* 393* 61* 214* 414*  BUN 47*  < > 41* 11 14 19  24* 40* 16  CREATININE 7.71*  < > 7.00* 3.19* 3.57* 4.19* 4.70* 6.80* 3.64*  CALCIUM 7.7*  < > 7.6* 8.3* 7.6* 7.4* 8.1* 7.8* 7.9*  PHOS 1.7*  --  4.0  --   --    --   --  2.9  --   < > = values in this interval not displayed. CBC  Recent Labs Lab 05/04/13 0555 05/05/13 1145 05/07/13 1401 05/09/13 0726  WBC 9.1 8.6 12.1* 7.0  HGB 11.3* 9.5* 10.9* 11.1*  HCT 36.2 30.5* 33.9* 33.8*  MCV 105.2* 102.7* 99.4 99.4  PLT 106* 111* 159 190   Medications:    . amLODipine  10 mg Oral Daily  . calcium acetate  667 mg Oral TID WC  . clonazePAM  1 mg Oral QHS  . darbepoetin (ARANESP) injection - DIALYSIS  100 mcg Intravenous Q Thu-HD  . doxercalciferol  1 mcg Intravenous Q T,Th,Sa-HD  . feeding supplement (NEPRO CARB STEADY)  237 mL Oral Q24H  . gabapentin  100 mg Oral Daily  . heparin  5,000 Units Subcutaneous Q8H  . insulin aspart  0-5 Units Subcutaneous TID WC  . insulin glargine  5 Units Subcutaneous BID  . irbesartan  150 mg Oral Daily  . [START ON 05/11/2013] levofloxacin  500 mg Oral Q48H  . metoprolol succinate  100 mg Oral QHS  . montelukast  10 mg Oral QHS  .  multivitamin  1 tablet Oral QHS  . pantoprazole  40 mg Oral QHS  . predniSONE  5 mg Oral Daily  . sodium chloride  3 mL Intravenous Q12H  . [START ON 05/16/2013] tacrolimus  0.5 mg Oral QHS  . tacrolimus  1 mg Oral QHS

## 2013-05-10 NOTE — Progress Notes (Signed)
Patient is asymptomatic ,not in distress,skin warm and dry,alert and oriented x4,amubalatory.M.D. Made aware.

## 2013-05-10 NOTE — Discharge Summary (Signed)
Physician Discharge Summary  [name] RUE:[id number] DOB: Dec 18, 1970 DOA: 05/03/2013  PCP: [name], MD  Admit date: 05/03/2013 Discharge date: 05/10/2013  Time spent: 38 minutes  Recommendations for Outpatient Follow-up:  1. Follow up with PCP tomorrow  2. Follow upwith renal as recommended  Discharge Diagnoses:  Active Problems:   ESRD (end stage renal disease)   DM (diabetes mellitus), type 1 with renal complications   Hyperlipidemia   GERD (gastroesophageal reflux disease)   Depression   Hyperparathyroidism, secondary   AV fistula infection   Mental status, decreased   HCAP (healthcare-associated pneumonia)   Hyperglycemia   Acute encephalopathy   Discharge Condition: improved.   Diet recommendation: carb modified diet  Filed Weights   05/09/13 0705 05/09/13 1051 05/09/13 2159  Weight: 69.5 kg (153 lb 3.5 oz) 65.5 kg (144 lb 6.4 oz) 68.266 kg (150 lb 8 oz)    History of present illness:  42 year old female with history of incision and disease status post renal transplant now on HD ( Tuesday, Thursday and Saturday), uncontrolled type 1diabetes mellitus, hypertension, hyperlipidemia, anemia, depression, COPD with recurrent hospitalization several times with issues of uncontrolled DM presented with acute respiratory failure due to fluid overload from missing dialysis and hypoglycemia. Initially she was placed on BiPAP and received D50 and was then taken for urgent dialysis for which her breathing improved. Patient febrile with possible infiltrate on CXR. She was started on broad spectrum antibiotics for the management of health care associated pneumonia. Repeat CXR on 12/10 showed improvement in the aeration, without any worsening of the pneumonia. She has been afebrile and we will STOP antibiotics. Her cbg's have been very labile during her hospitalization and staff have reported that the patient sneaks in candies and sugary stuff into her room and when  the sugars are elevated, she gets the coverage and her blood sugar bottoms down to less than 50. We have made appropriate changes to her SSI, not to be given more than 4 units and changed the lantus to her home dose.    Hospital Course:   Acute encephalopathy  due to combination of hypoxic acute respiratory failure and metabolic encephalopathy from hypoglycemia/ hyperglycemia  -Hypoxic respiratory failure secondary to missed dialysis and volume overload and hcap. Symptoms improved after urgent hemodialysis.  -admission CXR CHF versus pneumonia.  -patient also reports that her aunt noticed her to have seizure like activity during the initial episode. This has happened to her once before last month. Could be hypoglycemia seizure. Will obtain EEG, which showed mod to severe encephalopathic changes and no epileptiform activity.  Uncontrolled type 1 DM  Likely in the setting of missed dialysis and continuing to take long-acting insulin. Hypoglycemia resolved but now has elevated blood glucose.  . -Her last A1c was 10.  - on further discussing with the patient , she reports she takes 6 units of lantus twice a day, I have discussed the fact that her home insulin dose reflects that she is on daily dose. Pt insists that she takes twice daily dosing. I have made the appropriate changes in the medications. On 12/10, pt 's cbg were in 1100's, she was put on glucose stabilizer and she became hypoglycemic with cbg's in low 30's. She was taken off the glucose stabilizer and was put on lantus 5 units BID, with custon SSI. She is also non compliant to diet. She is eating chips and drinking sodas and sneaking sugary candies.  CBG (last 3)   Recent Labs  05/10/13 1104 05/10/13 1135 05/10/13 1159  GLUCAP 23* 44* 88     Healthcare associated versus aspiration pneumonia  Patient had infiltrate on chest x-ray with fever while on dialysis. Started on empiric vancomycin and Zosyn. Cultures have been negative so  far. Repeat CXR on 12/10 showed improved aeration, did not show worsening infiltrates. She is afebrile and does not have leukocytosis and she appears non toxic. We have de escalete the antibiotics to levaquin. She completed 7 days of antibiotics.  ESRD on HD  . Dialysis per renal. SW consulted for transportation needs.  -Continue PhosLo and hectoral  Status post kidney-pancreatic transplant.  Continue home transplant medications along with prednisone. She is discharged on tapering dose of prograf Hypertension.  Poorly controlled upon presentation. Now improved with dialysis. Resume all medications  . GERD  continue PPI.  Migraine headaches  Resume home medications. increased imitrex to 50 mg BID prn.      Procedures:  HD  Consultations:  Renal   Discharge Exam: Filed Vitals:   05/10/13 0445  BP: 136/65  Pulse: 77  Temp: 97.9 F (36.6 C)  Resp: 18    General: alert afebrile comfortable Cardiovascular: s1s2 Respiratory: ctab  Discharge Instructions  Discharge Orders   Future Orders Complete By Expires   Diet - low sodium heart healthy  As directed    Discharge instructions  As directed    Comments:     Follow up with PCP in one week       Medication List    STOP taking these medications       HYDROcodone-acetaminophen 5-325 MG per tablet  Commonly known as:  NORCO      TAKE these medications       accu-chek soft touch lancets  Use as instructed     albuterol 108 (90 BASE) MCG/ACT inhaler  Commonly known as:  PROVENTIL HFA;VENTOLIN HFA  Inhale 2 puffs into the lungs every 4 (four) hours as needed for wheezing or shortness of breath (as per home regimen).     calcium acetate 667 MG capsule  Commonly known as:  PHOSLO  Take 1 capsule (667 mg total) by mouth 3 (three) times daily with meals.     clonazePAM 0.5 MG tablet  Commonly known as:  KLONOPIN  Take 1 mg by mouth at bedtime.     EXFORGE 10-160 MG per tablet  Generic drug:   amLODipine-valsartan  Take 1 tablet by mouth at bedtime.     feeding supplement (NEPRO CARB STEADY) Liqd  Take 237 mLs by mouth daily.     gabapentin 100 MG capsule  Commonly known as:  NEURONTIN  Take 100 mg by mouth daily.     glucose blood test strip  Commonly known as:  ACCU-CHEK AVIVA  Test BS 4x/day. Dx  251.2, 250.41     insulin aspart 100 UNIT/ML injection  Commonly known as:  novoLOG  Inject 0-5 Units into the skin 3 (three) times daily with meals.     insulin glargine 100 UNIT/ML injection  Commonly known as:  LANTUS  Inject 0.05 mLs (5 Units total) into the skin 2 (two) times daily.     metoprolol succinate 100 MG 24 hr tablet  Commonly known as:  TOPROL-XL  Take 100 mg by mouth at bedtime. Take with or immediately following a meal.     montelukast 10 MG tablet  Commonly known as:  SINGULAIR  Take 10 mg by mouth at bedtime.     multivitamin Tabs  tablet  Take 1 tablet by mouth daily.     pantoprazole 40 MG tablet  Commonly known as:  PROTONIX  Take 1 tablet (40 mg total) by mouth at bedtime.     predniSONE 5 MG tablet  Commonly known as:  DELTASONE  Take 5 mg by mouth daily.     SUMAtriptan 50 MG tablet  Commonly known as:  IMITREX  Take 1 tablet (50 mg total) by mouth 2 (two) times daily as needed for migraine or headache. May repeat in 2 hours if headache persists or recurs.     tacrolimus 1 MG capsule  Commonly known as:  PROGRAF  Take 1 capsule (1 mg total) by mouth at bedtime.     tacrolimus 0.5 MG capsule  Commonly known as:  PROGRAF  Take 1 capsule (0.5 mg total) by mouth at bedtime.  Start taking on:  05/16/2013     zolpidem 12.5 MG CR tablet  Commonly known as:  AMBIEN CR  Take 12.5 mg by mouth at bedtime.       Allergies  Allergen Reactions  . Depakote [Divalproex Sodium] Other (See Comments)    Pt gets "delirious"  . Morphine And Related Nausea And Vomiting and Other (See Comments)    "makes me delirious"  . Penicillins  Anaphylaxis    Tolerated Zosyn Dec 2014  . Tramadol Nausea And Vomiting  . Vicodin [Hydrocodone-Acetaminophen] Itching and Rash       Follow-up Information   Follow up with [name], [name], MD. Schedule an appointment as soon as possible for a visit in 1 week.   Specialty:  Family Medicine   Contact information:   [address] [place] [address] [phone]        The results of significant diagnostics from this hospitalization (including imaging, microbiology, ancillary and laboratory) are listed below for reference.    Significant Diagnostic Studies: Dg Chest 2 View  05/06/2013   CLINICAL DATA:  The patient was told she had pneumonia 2 days ago. The patient is on antibiotics.  EXAM: CHEST  2 VIEW  COMPARISON:  05/03/2013  FINDINGS: The heart is enlarged. There has been improvement in aeration of the right lung. Persistent infiltrates are identified at the lung bases bilaterally, left greater than right. Findings favor infectious infiltrate over edema. Surgical clips are identified in the right upper quadrant of the abdomen.  IMPRESSION: 1. Cardiomegaly without definite evidence for pulmonary edema. 2. Improving aeration with persistent bilateral lower lobe infiltrates.   Electronically Signed   By: Rosalie Gums M.D.   On: 05/06/2013 19:33   Dg Chest Port 1 View  05/03/2013   CLINICAL DATA:  Fever.  Migraine headaches.  EXAM: PORTABLE CHEST - 1 VIEW  COMPARISON:  Earlier today.  FINDINGS: The cardiac silhouette remains mildly enlarged. Less dense airspace consolidation throughout the right lung with no significant change in mild airspace consolidation in the left lung. No visible pleural fluid. Unremarkable bones.  IMPRESSION: Improving asymmetrical pulmonary edema or pneumonia with stable mild cardiomegaly.   Electronically Signed   By: Gordan Payment M.D.   On: 05/03/2013 21:01   Dg Chest Port 1 View  05/03/2013   CLINICAL DATA:  Shortness of breath.  EXAM:  PORTABLE CHEST - 1 VIEW  COMPARISON:  Chest radiograph performed 04/06/2013  FINDINGS: There is new right-sided airspace opacification, concerning for pneumonia. Asymmetric pulmonary edema is considered less likely. Mild left basilar airspace opacity is seen. A small right pleural effusion  is suspected. No pneumothorax is seen.  The cardiomediastinal silhouette is mildly enlarged. No acute osseous abnormalities are identified.  IMPRESSION: 1. New diffuse right-sided airspace opacification, concerning for pneumonia. Asymmetric pulmonary edema is considered less likely. Mild left basilar airspace opacity also seen. 2. Suspect small right pleural effusion. 3. Mild cardiomegaly.   Electronically Signed   By: Roanna Raider M.D.   On: 05/03/2013 06:52    Microbiology: Recent Results (from the past 240 hour(s))  CULTURE, BLOOD (ROUTINE X 2)     Status: None   Collection Time    05/03/13  2:35 PM      Result Value Range Status   Specimen Description BLOOD HEMODIALYSIS LINE   Final   Special Requests BOTTLES DRAWN AEROBIC AND ANAEROBIC 10CC   Final   Culture  Setup Time     Final   Value: 05/04/2013 00:10     Performed at [facility]   Culture     Final   Value:        BLOOD CULTURE RECEIVED NO GROWTH TO DATE CULTURE WILL BE HELD FOR 5 DAYS BEFORE ISSUING A FINAL NEGATIVE REPORT     Performed at [facility]   Report Status PENDING   Incomplete  CULTURE, BLOOD (ROUTINE X 2)     Status: None   Collection Time    05/03/13  2:50 PM      Result Value Range Status   Specimen Description BLOOD HEMODIALYSIS LINE   Final   Special Requests BOTTLES DRAWN AEROBIC AND ANAEROBIC 10CC   Final   Culture  Setup Time     Final   Value: 05/04/2013 00:10     Performed at [facility]   Culture     Final   Value:        BLOOD CULTURE RECEIVED NO GROWTH TO DATE CULTURE WILL BE HELD FOR 5 DAYS BEFORE ISSUING A FINAL NEGATIVE REPORT     Performed at [facility]   Report Status  PENDING   Incomplete  CLOSTRIDIUM DIFFICILE BY PCR     Status: None   Collection Time    05/09/13 12:10 PM      Result Value Range Status   C difficile by pcr NEGATIVE  NEGATIVE Final     Labs: Basic Metabolic Panel:  Recent Labs Lab 05/05/13 1145  05/07/13 1401  05/07/13 2240 05/08/13 0245 05/08/13 0845 05/09/13 0726 05/09/13 2000  NA 129*  < > 128*  < > 129* 128* 131* 130* 127*  K 4.0  < > 4.1  < > 4.5 4.4 4.3 4.1 4.3  CL 89*  < > 88*  < > 90* 91* 93* 91* 90*  CO2 23  < > 22  < > 26 25 27 24 25   GLUCOSE 198*  < > 45*  < > 449* 393* 61* 214* 414*  BUN 47*  < > 41*  < > 14 19 24* 40* 16  CREATININE 7.71*  < > 7.00*  < > 3.57* 4.19* 4.70* 6.80* 3.64*  CALCIUM 7.7*  < > 7.6*  < > 7.6* 7.4* 8.1* 7.8* 7.9*  PHOS 1.7*  --  4.0  --   --   --   --  2.9  --   < > = values in this interval not displayed. Liver Function Tests:  Recent Labs Lab 05/05/13 1145 05/07/13 1401 05/09/13 0726  ALBUMIN 2.5* 3.1* 2.6*   No results found for this basename: LIPASE, AMYLASE,  in the last 168 hours No results found for this basename: AMMONIA,  in the last 168 hours CBC:  Recent Labs Lab 05/04/13 0555 05/05/13 1145 05/07/13 1401 05/09/13 0726  WBC 9.1 8.6 12.1* 7.0  HGB 11.3* 9.5* 10.9* 11.1*  HCT 36.2 30.5* 33.9* 33.8*  MCV 105.2* 102.7* 99.4 99.4  PLT 106* 111* 159 190   Cardiac Enzymes: No results found for this basename: CKTOTAL, CKMB, CKMBINDEX, TROPONINI,  in the last 168 hours BNP: BNP (last 3 results)  Recent Labs  05/03/13 0653  PROBNP 33341.0*   CBG:  Recent Labs Lab 05/09/13 2154 05/10/13 0800 05/10/13 1104 05/10/13 1135 05/10/13 1159  GLUCAP 278* 312* 23* 44* 88       Signed:  Elleanna Melling  [facility] 05/10/2013, 12:09 PM

## 2013-05-10 NOTE — Progress Notes (Signed)
Hypoglycemic Event  CBG: 22  Treatment: 1 pack of graham crackers plus 2 cups of apple juice.Refused gluco gel.  Symptoms: lethargic  Follow-up CBG 1135 CBG Result:44  Possible Reasons for Event: unknown  Comments/MD notified:yes    [name], Kem Kays  Remember to initiate Hypoglycemia Order Set & complete

## 2013-05-16 ENCOUNTER — Encounter (HOSPITAL_COMMUNITY): Payer: Self-pay | Admitting: Emergency Medicine

## 2013-05-16 ENCOUNTER — Emergency Department (HOSPITAL_COMMUNITY)
Admission: EM | Admit: 2013-05-16 | Discharge: 2013-05-16 | Disposition: A | Discharge status: Home / Self Care | Payer: Medicare Other | Attending: Emergency Medicine | Admitting: Emergency Medicine

## 2013-05-16 DIAGNOSIS — J438 Other emphysema: Secondary | ICD-10-CM | POA: Insufficient documentation

## 2013-05-16 DIAGNOSIS — Z880 Allergy status to penicillin: Secondary | ICD-10-CM | POA: Insufficient documentation

## 2013-05-16 DIAGNOSIS — R51 Headache: Secondary | ICD-10-CM | POA: Insufficient documentation

## 2013-05-16 DIAGNOSIS — I209 Angina pectoris, unspecified: Secondary | ICD-10-CM | POA: Insufficient documentation

## 2013-05-16 DIAGNOSIS — E1069 Type 1 diabetes mellitus with other specified complication: Secondary | ICD-10-CM | POA: Insufficient documentation

## 2013-05-16 DIAGNOSIS — N186 End stage renal disease: Secondary | ICD-10-CM | POA: Insufficient documentation

## 2013-05-16 DIAGNOSIS — IMO0002 Reserved for concepts with insufficient information to code with codable children: Secondary | ICD-10-CM | POA: Insufficient documentation

## 2013-05-16 DIAGNOSIS — E669 Obesity, unspecified: Secondary | ICD-10-CM | POA: Insufficient documentation

## 2013-05-16 DIAGNOSIS — I120 Hypertensive chronic kidney disease with stage 5 chronic kidney disease or end stage renal disease: Secondary | ICD-10-CM | POA: Insufficient documentation

## 2013-05-16 DIAGNOSIS — J45909 Unspecified asthma, uncomplicated: Secondary | ICD-10-CM | POA: Insufficient documentation

## 2013-05-16 DIAGNOSIS — K219 Gastro-esophageal reflux disease without esophagitis: Secondary | ICD-10-CM | POA: Insufficient documentation

## 2013-05-16 DIAGNOSIS — Z872 Personal history of diseases of the skin and subcutaneous tissue: Secondary | ICD-10-CM | POA: Insufficient documentation

## 2013-05-16 DIAGNOSIS — Z862 Personal history of diseases of the blood and blood-forming organs and certain disorders involving the immune mechanism: Secondary | ICD-10-CM | POA: Insufficient documentation

## 2013-05-16 DIAGNOSIS — E162 Hypoglycemia, unspecified: Secondary | ICD-10-CM

## 2013-05-16 DIAGNOSIS — Z794 Long term (current) use of insulin: Secondary | ICD-10-CM | POA: Insufficient documentation

## 2013-05-16 DIAGNOSIS — Z79899 Other long term (current) drug therapy: Secondary | ICD-10-CM | POA: Insufficient documentation

## 2013-05-16 DIAGNOSIS — G2581 Restless legs syndrome: Secondary | ICD-10-CM | POA: Insufficient documentation

## 2013-05-16 DIAGNOSIS — F3289 Other specified depressive episodes: Secondary | ICD-10-CM | POA: Insufficient documentation

## 2013-05-16 DIAGNOSIS — F329 Major depressive disorder, single episode, unspecified: Secondary | ICD-10-CM | POA: Insufficient documentation

## 2013-05-16 DIAGNOSIS — Z8701 Personal history of pneumonia (recurrent): Secondary | ICD-10-CM | POA: Insufficient documentation

## 2013-05-16 DIAGNOSIS — Z992 Dependence on renal dialysis: Secondary | ICD-10-CM | POA: Insufficient documentation

## 2013-05-16 DIAGNOSIS — G43909 Migraine, unspecified, not intractable, without status migrainosus: Secondary | ICD-10-CM | POA: Insufficient documentation

## 2013-05-16 LAB — GLUCOSE, CAPILLARY
Glucose-Capillary: 74 mg/dL (ref 70–99)
Glucose-Capillary: 192 mg/dL — ABNORMAL HIGH (ref 70–99)

## 2013-05-16 MED ORDER — SUMATRIPTAN SUCCINATE 6 MG/0.5ML ~~LOC~~ SOLN
6.0000 mg | Freq: Once | SUBCUTANEOUS | Status: DC
  Filled 2013-05-16: qty 0.5

## 2013-05-16 MED ORDER — OXYCODONE-ACETAMINOPHEN 5-325 MG PO TABS
1.0000 | ORAL_TABLET | Freq: Once | ORAL | Status: AC
  Administered 2013-05-16: 1 via ORAL
  Filled 2013-05-16: qty 1

## 2013-05-16 NOTE — ED Notes (Signed)
Provider at bedside

## 2013-05-16 NOTE — ED Notes (Signed)
Brought to ED via [facility] for eval of low cbg post dialysis treatment. Pt received full treatment. No ams. Pt was given 1 amp D50 at center. Alert and oriented on arrival to ED. Pt's dialysis site still accessed

## 2013-05-16 NOTE — ED Notes (Addendum)
Patient still has fistula/graft accessed from dialysis with saline at kvo. IV team paged for de-access

## 2013-05-16 NOTE — ED Notes (Signed)
RN and tech and bedside; pressure being held on site; pt tolerating well. Bleeding controlled.

## 2013-05-16 NOTE — ED Notes (Signed)
cbg taken.. Reading.  192

## 2013-05-16 NOTE — ED Notes (Signed)
IV team on the way 

## 2013-05-16 NOTE — ED Notes (Signed)
Patient acknowledges that she is not allergic to percocet

## 2013-05-16 NOTE — ED Provider Notes (Signed)
CSN: [id number]     Arrival date & time 05/16/13  1217 History   First MD Initiated Contact with Patient 05/16/13 1221     Chief Complaint  Patient presents with  . Hypoglycemia   (Consider location/radiation/quality/duration/timing/severity/associated sxs/prior Treatment) HPI Comments: Patient here with low CBG at dialysis this morning.  She states that she took her usual 6 units unit of novolog this morning, she states that her ride for dialysis came early so that she did not get a chance to eat.  She reports going to dialysis, got her full treatment (3.5 hours) but as they were getting ready to d/c her access her arm fell to the side (though she has full recollection of this and states she was awake and alert) so the staff checked her blood sugar and it was noted to be "low" on the meter.  EMS arrived and they got a reading of 41 so they gave her an amp of D50 via her dialysis access.  She was then transported here.  She reports she feels normal, blood sugar here 71, she denies fever, chills, nausea, vomiting, sweating, dizziness.  She does report a mild headache which she states she usually gets with dialysis.  Patient is a 42 y.o. female presenting with hypoglycemia. The history is provided by the patient. No language interpreter was used.  Hypoglycemia Initial blood sugar:  41 Blood sugar after intervention:  71 Severity:  Mild Onset quality:  Gradual Duration:  4 hours Timing:  Constant Progression:  Partially resolved Chronicity:  Chronic Diabetic status:  Controlled with insulin Context: decreased oral intake   Context: not diet changes, not exercise, not ingestion, not new diabetes diagnosis, not recent illness and not treatment noncompliance   Relieved by:  IV glucose Ineffective treatments:  None tried Associated symptoms: no altered mental status, no dizziness, no seizures, no shortness of breath, no sweats, no syncope, no tremors, no visual change, no vomiting and no weakness    Risk factors: kidney disease and uncontrolled diabetes     Past Medical History  Diagnosis Date  . Hyperlipidemia   . Alopecia   . Obesity   . Iron deficiency   . ESRD (end stage renal disease)     S/p pancreatic and kidney transplant, but back on HD 2008, dialysis at [facility], [place], Sat  . RLS (restless legs syndrome)   . Injury of conjunctiva and corneal abrasion of right eye without foreign body   . Cellulitis   . 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 MMF stopped in November 2014.  After one month plan is to taper off tacrolimus (decrease from bid to qday for 1 week then reduce dose by 50% for one week then stop). Prednisone should then be the last to come   . Anemia   . Blood transfusion 2004    "when I had my transplant"  . Migraine   . Hyperparathyroidism, secondary   . Diabetic retinopathy   . Hypertension     takes Metoprolol and Exforge daily  . Depression     takes Klonopin nightly  . Diabetes mellitus type 1     Pt states diagnosed at age 39 with prior episodes of DKA. S/p pancreatic transplant   . GERD (gastroesophageal reflux disease)     takes Protonix daily  . Bronchitis   .  Migraine   . Asthma   . Emphysema of lung   . Angina     none in past 2 years.  . Stomach pain     Hx: of chronic  . Pneumonia 2012    "double"   Past Surgical History  Procedure Laterality Date  . 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 Thyroglobulin      x 7  . Av fistula placement      right upper arm  . Cholecystectomy  1995  .  hd graft placement/removal      "had 2 in my left upper arm"  . Eye surgery    . Retinopathy surgery    . Tooth extraction  10/10/11    X's two  . Insertion of dialysis catheter  12/08/2011    Procedure: INSERTION OF  DIALYSIS CATHETER;  Surgeon: Chuck Hint, MD;  Location: [facility] OR;  Service: Vascular;  Laterality: Left;  . Av fistula placement  01/22/2012    Procedure: INSERTION OF ARTERIOVENOUS (AV) GORE-TEX GRAFT ARM;  Surgeon: Chuck Hint, MD;  Location: [facility] OR;  Service: Vascular;  Laterality: Left;  Ultrasound guided.  . Av fistula placement  03/14/2012    Procedure: INSERTION OF ARTERIOVENOUS (AV) GORE-TEX GRAFT THIGH;  Surgeon: Chuck Hint, MD;  Location: [facility] OR;  Service: Vascular;  Laterality: Right;  . False aneurysm repair Right 01/02/2013    Procedure: Excision of lymphocele in right thigh.;  Surgeon: Chuck Hint, MD;  Location: [facility] OR;  Service: Vascular;  Laterality: Right;  . Carpal tunnel release Right 03/02/2013    Procedure: CARPAL TUNNEL RELEASE;  Surgeon: Tami Ribas, MD;  Location: [facility];  Service: Orthopedics;  Laterality: Right;  . Flexible sigmoidoscopy N/A 03/24/2013    Procedure: FLEXIBLE SIGMOIDOSCOPY;  Surgeon: Florencia Reasons, MD;  Location: [facility] ENDOSCOPY;  Service: Endoscopy;  Laterality: N/A;  . Revision of arteriovenous goretex graft Right 04/02/2013    Procedure: REPAIR OF PSEUDOANEURYSM OF ARTERIOVENOUS GORETEX GRAFT;  Surgeon: Chuck Hint, MD;  Location: [facility] OR;  Service: Vascular;  Laterality: Right;   Family History  Problem Relation Age of Onset  . Hypertension Mother   . Kidney disease Mother   . Diabetes Mother   . Hypertension Father   . Kidney disease Father   . Diabetes Father    History  Substance Use Topics  . Smoking status: Never Smoker   . Smokeless tobacco: Never Used  . Alcohol Use: No   OB History   Grav Para Term Preterm Abortions TAB SAB Ect Mult Living                 Review of Systems  Constitutional: Negative for diaphoresis.  Respiratory: Negative for shortness of breath.   Cardiovascular: Negative for syncope.  Gastrointestinal: Negative for vomiting.  Neurological:  Positive for headaches. Negative for dizziness, tremors and seizures.  All other systems reviewed and are negative.    Allergies  Depakote; Morphine and related; Penicillins; Tramadol; and Vicodin  Home Medications   Current Outpatient Rx  Name  Route  Sig  Dispense  Refill  . albuterol (PROVENTIL HFA;VENTOLIN HFA) 108 (90 BASE) MCG/ACT inhaler   Inhalation   Inhale 2 puffs into the lungs every 4 (four) hours as needed for wheezing or shortness of breath (as per home regimen).   1 Inhaler   0   . calcium acetate (PHOSLO) 667 MG capsule  Oral   Take 1 capsule (667 mg total) by mouth 3 (three) times daily with meals.   90 capsule   1   . clonazePAM (KLONOPIN) 0.5 MG tablet   Oral   Take 1 mg by mouth at bedtime.          Marland Kitchen EXFORGE 10-160 MG per tablet   Oral   Take 1 tablet by mouth at bedtime.          . gabapentin (NEURONTIN) 100 MG capsule   Oral   Take 100 mg by mouth daily.         Marland Kitchen glucose blood (ACCU-CHEK AVIVA) test strip      Test BS 4x/day. Dx  251.2, 250.41   100 each   0   . insulin aspart (NOVOLOG) 100 UNIT/ML injection   Subcutaneous   Inject 0-5 Units into the skin 3 (three) times daily with meals.   1 vial   1     CBG < 70: Implement Hypoglycemia Protocol. CBG 70 ...   . insulin glargine (LANTUS) 100 UNIT/ML injection   Subcutaneous   Inject 0.05 mLs (5 Units total) into the skin 2 (two) times daily.   10 mL   1   . Lancets (ACCU-CHEK SOFT TOUCH) lancets      Use as instructed   100 each   0   . metoprolol succinate (TOPROL-XL) 100 MG 24 hr tablet   Oral   Take 100 mg by mouth at bedtime. Take with or immediately following a meal.         . montelukast (SINGULAIR) 10 MG tablet   Oral   Take 10 mg by mouth at bedtime.         . multivitamin (RENA-VIT) TABS tablet   Oral   Take 1 tablet by mouth daily.         . Nutritional Supplements (FEEDING SUPPLEMENT, NEPRO CARB STEADY,) LIQD   Oral   Take 237 mLs by mouth  daily.      0   . pantoprazole (PROTONIX) 40 MG tablet   Oral   Take 1 tablet (40 mg total) by mouth at bedtime.   30 tablet   0   . predniSONE (DELTASONE) 5 MG tablet   Oral   Take 5 mg by mouth daily.          . SUMAtriptan (IMITREX) 50 MG tablet   Oral   Take 1 tablet (50 mg total) by mouth 2 (two) times daily as needed for migraine or headache. May repeat in 2 hours if headache persists or recurs.   10 tablet   0   . tacrolimus (PROGRAF) 0.5 MG capsule   Oral   Take 1 capsule (0.5 mg total) by mouth at bedtime.   7 capsule   0   . zolpidem (AMBIEN CR) 12.5 MG CR tablet   Oral   Take 12.5 mg by mouth at bedtime.          BP 158/82  Pulse 77  Temp(Src) 97.7 F (36.5 C) (Oral)  Resp 16  SpO2 100%  LMP 04/12/2013 Physical Exam  Nursing note and vitals reviewed. Constitutional: She is oriented to person, place, and time. She appears well-developed and well-nourished. No distress.  HENT:  Head: Normocephalic and atraumatic.  Right Ear: External ear normal.  Left Ear: External ear normal.  Nose: Nose normal.  Mouth/Throat: Oropharynx is clear and moist. No oropharyngeal exudate.  Eyes: Conjunctivae are normal. Pupils are equal,  round, and reactive to light. No scleral icterus.  Neck: Normal range of motion. Neck supple.  Cardiovascular: Normal rate, regular rhythm and normal heart sounds.  Exam reveals no gallop and no friction rub.   No murmur heard. Pulmonary/Chest: Effort normal and breath sounds normal. No respiratory distress. She has no wheezes. She has no rales. She exhibits no tenderness.  Abdominal: Soft. Bowel sounds are normal. She exhibits no distension. There is no tenderness. There is no rebound and no guarding.  Musculoskeletal: Normal range of motion. She exhibits no edema and no tenderness.  Right thigh AV fistula accessed  Lymphadenopathy:    She has no cervical adenopathy.  Neurological: She is alert and oriented to person, place, and time.  She exhibits normal muscle tone. Coordination normal.  Skin: Skin is warm and dry. No rash noted. No erythema. No pallor.  Psychiatric: She has a normal mood and affect. Her behavior is normal. Judgment and thought content normal.    ED Course  Procedures (including critical care time) Labs Review Labs Reviewed  GLUCOSE, CAPILLARY   Imaging Review No results found.  EKG Interpretation   None      Results for orders placed during the hospital encounter of 05/16/13  GLUCOSE, CAPILLARY      Result Value Range   Glucose-Capillary 74  70 - 99 mg/dL  GLUCOSE, CAPILLARY      Result Value Range   Glucose-Capillary 192 (*) 70 - 99 mg/dL   Dg Chest 2 View  16/02/9603   CLINICAL DATA:  The patient was told she had pneumonia 2 days ago. The patient is on antibiotics.  EXAM: CHEST  2 VIEW  COMPARISON:  05/03/2013  FINDINGS: The heart is enlarged. There has been improvement in aeration of the right lung. Persistent infiltrates are identified at the lung bases bilaterally, left greater than right. Findings favor infectious infiltrate over edema. Surgical clips are identified in the right upper quadrant of the abdomen.  IMPRESSION: 1. Cardiomegaly without definite evidence for pulmonary edema. 2. Improving aeration with persistent bilateral lower lobe infiltrates.   Electronically Signed   By: Rosalie Gums M.D.   On: 05/06/2013 19:33   Dg Chest Port 1 View  05/03/2013   CLINICAL DATA:  Fever.  Migraine headaches.  EXAM: PORTABLE CHEST - 1 VIEW  COMPARISON:  Earlier today.  FINDINGS: The cardiac silhouette remains mildly enlarged. Less dense airspace consolidation throughout the right lung with no significant change in mild airspace consolidation in the left lung. No visible pleural fluid. Unremarkable bones.  IMPRESSION: Improving asymmetrical pulmonary edema or pneumonia with stable mild cardiomegaly.   Electronically Signed   By: Gordan Payment M.D.   On: 05/03/2013 21:01   Dg Chest Port 1  View  05/03/2013   CLINICAL DATA:  Shortness of breath.  EXAM: PORTABLE CHEST - 1 VIEW  COMPARISON:  Chest radiograph performed 04/06/2013  FINDINGS: There is new right-sided airspace opacification, concerning for pneumonia. Asymmetric pulmonary edema is considered less likely. Mild left basilar airspace opacity is seen. A small right pleural effusion is suspected. No pneumothorax is seen.  The cardiomediastinal silhouette is mildly enlarged. No acute osseous abnormalities are identified.  IMPRESSION: 1. New diffuse right-sided airspace opacification, concerning for pneumonia. Asymmetric pulmonary edema is considered less likely. Mild left basilar airspace opacity also seen. 2. Suspect small right pleural effusion. 3. Mild cardiomegaly.   Electronically Signed   By: Roanna Raider M.D.   On: 05/03/2013 06:52     MDM  Hypoglycemia  Patient here with hypoglycemia after taking her insulin this morning and not eating, she has been fed a sandwich and stayed here an hour - repeat blood sugar is 192.  Given 1 percocet for headache.  Will follow up with PCP in regards to this.  Izola Price Marisue Humble, PA-C 05/16/13 1422

## 2013-05-16 NOTE — ED Provider Notes (Signed)
Medical screening examination/treatment/procedure(s) were performed by non-physician practitioner and as supervising physician I was immediately available for consultation/collaboration.  EKG Interpretation    Date/Time:  Saturday May 16 2013 12:38:09 EST Ventricular Rate:  78 PR Interval:  181 QRS Duration: 83 QT Interval:  432 QTC Calculation: 492 R Axis:   108 Text Interpretation:  Sinus rhythm Consider left atrial enlargement Right axis deviation Consider left ventricular hypertrophy Borderline prolonged QT interval No significant change since last tracing Confirmed by Anitra Lauth  MD, Grant Swager ([id number]) on 05/16/2013 1:13:50 PM              Gwyneth Sprout, MD 05/16/13 1542

## 2013-05-16 NOTE — ED Notes (Signed)
Patient states she took 6 units of novolog this morning before she left for dialysis and forgot to grab something to eat.

## 2013-05-16 NOTE — ED Notes (Signed)
Per IV RN petroluem gauze left on and covered with 2x2. No bleeding from site at this time.

## 2013-06-08 ENCOUNTER — Other Ambulatory Visit: Payer: Self-pay | Admitting: Internal Medicine

## 2013-06-27 ENCOUNTER — Emergency Department (HOSPITAL_COMMUNITY): Payer: Medicare Other

## 2013-06-27 ENCOUNTER — Encounter (HOSPITAL_COMMUNITY): Payer: Self-pay | Admitting: Emergency Medicine

## 2013-06-27 ENCOUNTER — Emergency Department (HOSPITAL_COMMUNITY)
Admission: EM | Admit: 2013-06-27 | Discharge: 2013-06-27 | Disposition: A | Discharge status: Home / Self Care | Payer: Medicare Other | Attending: Emergency Medicine | Admitting: Emergency Medicine

## 2013-06-27 DIAGNOSIS — E1039 Type 1 diabetes mellitus with other diabetic ophthalmic complication: Secondary | ICD-10-CM | POA: Insufficient documentation

## 2013-06-27 DIAGNOSIS — Z872 Personal history of diseases of the skin and subcutaneous tissue: Secondary | ICD-10-CM | POA: Insufficient documentation

## 2013-06-27 DIAGNOSIS — Z862 Personal history of diseases of the blood and blood-forming organs and certain disorders involving the immune mechanism: Secondary | ICD-10-CM | POA: Insufficient documentation

## 2013-06-27 DIAGNOSIS — Z992 Dependence on renal dialysis: Secondary | ICD-10-CM | POA: Insufficient documentation

## 2013-06-27 DIAGNOSIS — Z8669 Personal history of other diseases of the nervous system and sense organs: Secondary | ICD-10-CM | POA: Insufficient documentation

## 2013-06-27 DIAGNOSIS — Z87828 Personal history of other (healed) physical injury and trauma: Secondary | ICD-10-CM | POA: Insufficient documentation

## 2013-06-27 DIAGNOSIS — I120 Hypertensive chronic kidney disease with stage 5 chronic kidney disease or end stage renal disease: Secondary | ICD-10-CM | POA: Insufficient documentation

## 2013-06-27 DIAGNOSIS — Z8701 Personal history of pneumonia (recurrent): Secondary | ICD-10-CM | POA: Insufficient documentation

## 2013-06-27 DIAGNOSIS — G43909 Migraine, unspecified, not intractable, without status migrainosus: Secondary | ICD-10-CM

## 2013-06-27 DIAGNOSIS — F3289 Other specified depressive episodes: Secondary | ICD-10-CM | POA: Insufficient documentation

## 2013-06-27 DIAGNOSIS — Z79899 Other long term (current) drug therapy: Secondary | ICD-10-CM | POA: Insufficient documentation

## 2013-06-27 DIAGNOSIS — N186 End stage renal disease: Secondary | ICD-10-CM | POA: Insufficient documentation

## 2013-06-27 DIAGNOSIS — F329 Major depressive disorder, single episode, unspecified: Secondary | ICD-10-CM | POA: Insufficient documentation

## 2013-06-27 DIAGNOSIS — K219 Gastro-esophageal reflux disease without esophagitis: Secondary | ICD-10-CM | POA: Insufficient documentation

## 2013-06-27 DIAGNOSIS — J45909 Unspecified asthma, uncomplicated: Secondary | ICD-10-CM | POA: Insufficient documentation

## 2013-06-27 DIAGNOSIS — IMO0002 Reserved for concepts with insufficient information to code with codable children: Secondary | ICD-10-CM | POA: Insufficient documentation

## 2013-06-27 DIAGNOSIS — E669 Obesity, unspecified: Secondary | ICD-10-CM | POA: Insufficient documentation

## 2013-06-27 DIAGNOSIS — E162 Hypoglycemia, unspecified: Secondary | ICD-10-CM

## 2013-06-27 DIAGNOSIS — E11319 Type 2 diabetes mellitus with unspecified diabetic retinopathy without macular edema: Secondary | ICD-10-CM | POA: Insufficient documentation

## 2013-06-27 DIAGNOSIS — Z794 Long term (current) use of insulin: Secondary | ICD-10-CM | POA: Insufficient documentation

## 2013-06-27 DIAGNOSIS — E1069 Type 1 diabetes mellitus with other specified complication: Secondary | ICD-10-CM | POA: Insufficient documentation

## 2013-06-27 LAB — COMPREHENSIVE METABOLIC PANEL
ALBUMIN: 3.6 g/dL (ref 3.5–5.2)
ALT: 7 U/L (ref 0–35)
AST: 14 U/L (ref 0–37)
Alkaline Phosphatase: 187 U/L — ABNORMAL HIGH (ref 39–117)
BUN: 49 mg/dL — AB (ref 6–23)
CO2: 18 mEq/L — ABNORMAL LOW (ref 19–32)
CREATININE: 9.41 mg/dL — AB (ref 0.50–1.10)
Calcium: 8 mg/dL — ABNORMAL LOW (ref 8.4–10.5)
Chloride: 93 mEq/L — ABNORMAL LOW (ref 96–112)
GFR calc Af Amer: 5 mL/min — ABNORMAL LOW (ref >90)
GFR calc non Af Amer: 5 mL/min — ABNORMAL LOW (ref >90)
Glucose, Bld: 180 mg/dL — ABNORMAL HIGH (ref 70–99)
Potassium: 6.3 mEq/L — ABNORMAL HIGH (ref 3.7–5.3)
Sodium: 134 mEq/L — ABNORMAL LOW (ref 137–147)
TOTAL PROTEIN: 8.2 g/dL (ref 6.0–8.3)
Total Bilirubin: 0.3 mg/dL (ref 0.3–1.2)

## 2013-06-27 LAB — CBC WITH DIFFERENTIAL/PLATELET
BASOS ABS: 0 10*3/uL (ref 0.0–0.1)
BASOS PCT: 0 % (ref 0–1)
Eosinophils Absolute: 0.1 10*3/uL (ref 0.0–0.7)
Eosinophils Relative: 0 % (ref 0–5)
HCT: 40.5 % (ref 36.0–46.0)
Hemoglobin: 13.1 g/dL (ref 12.0–15.0)
LYMPHS PCT: 13 % (ref 12–46)
Lymphs Abs: 1.8 10*3/uL (ref 0.7–4.0)
MCH: 31.7 pg (ref 26.0–34.0)
MCHC: 32.3 g/dL (ref 30.0–36.0)
MCV: 98.1 fL (ref 78.0–100.0)
Monocytes Absolute: 0.4 10*3/uL (ref 0.1–1.0)
Monocytes Relative: 3 % (ref 3–12)
NEUTROS PCT: 84 % — AB (ref 43–77)
Neutro Abs: 11.5 10*3/uL — ABNORMAL HIGH (ref 1.7–7.7)
Platelets: 216 10*3/uL (ref 150–400)
RBC: 4.13 MIL/uL (ref 3.87–5.11)
RDW: 16 % — ABNORMAL HIGH (ref 11.5–15.5)
WBC: 13.8 10*3/uL — AB (ref 4.0–10.5)

## 2013-06-27 LAB — ETHANOL

## 2013-06-27 LAB — GLUCOSE, CAPILLARY
GLUCOSE-CAPILLARY: 123 mg/dL — AB (ref 70–99)
GLUCOSE-CAPILLARY: 152 mg/dL — AB (ref 70–99)
Glucose-Capillary: 231 mg/dL — ABNORMAL HIGH (ref 70–99)

## 2013-06-27 LAB — TROPONIN I: Troponin I: <0.3 ng/mL (ref <0.30)

## 2013-06-27 LAB — PRO B NATRIURETIC PEPTIDE: PRO B NATRI PEPTIDE: 5305 pg/mL — AB (ref 0–125)

## 2013-06-27 MED ORDER — DIPHENHYDRAMINE HCL 50 MG/ML IJ SOLN
12.5000 mg | Freq: Once | INTRAMUSCULAR | Status: AC
  Administered 2013-06-27: 12.5 mg via INTRAVENOUS
  Filled 2013-06-27: qty 1

## 2013-06-27 MED ORDER — PROCHLORPERAZINE EDISYLATE 5 MG/ML IJ SOLN
10.0000 mg | Freq: Once | INTRAMUSCULAR | Status: AC
Start: 2013-06-27 — End: 2013-06-27
  Administered 2013-06-27: 10 mg via INTRAVENOUS
  Filled 2013-06-27: qty 2

## 2013-06-27 NOTE — ED Provider Notes (Signed)
TIME SEEN: 3:13 PM  CHIEF COMPLAINT: Altered mental status, hypoglycemia  HPI: Patient is a 43 y.o. female with a history of end-stage renal disease on hemodialysis, last dialyzed on Thursday 2 days ago, hyperlipidemia, diabetes, migraines who presents emergency department with altered bowel status. Patient was found to be hypoglycemic with a glucose of 36. She was given glucagon and her glucose improved to 105. Patient is still very drowsy but arousable and oriented. She is complaining of diffuse headache is similar to her prior migraines. Daughter states her last and she was seen normal was last night. There is no history of drug or alcohol use. She states she's been feeling poorly since receiving a flu shot on Tuesday, 4 days ago. She has had no fevers, chest pain or shortness of breath, vomiting or diarrhea. No numbness, tingling or focal weakness. Denies any drugs or alcohol. No history of trauma.  ROS: See HPI Constitutional: no fever  Eyes: no drainage  ENT: no runny nose   Cardiovascular:  no chest pain  Resp: no SOB  GI: no vomiting GU: no dysuria Integumentary: no rash  Allergy: no hives  Musculoskeletal: no leg swelling  Neurological: no slurred speech ROS otherwise negative  PAST MEDICAL HISTORY/PAST SURGICAL HISTORY:  Past Medical History  Diagnosis Date  . Hyperlipidemia   . Alopecia   . Obesity   . Iron deficiency   . ESRD (end stage renal disease)     S/p pancreatic and kidney transplant, but back on HD 2008, dialysis at [facility] [place], Sat  . RLS (restless legs syndrome)   . Injury of conjunctiva and corneal abrasion of right eye without foreign body   . Cellulitis   . Immunosuppression 08/01/11    Dr Joelyn Oms spoke with [facility] transplant regarding immunosuppression (Apr 08, 2013). 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 MMF stopped in November 2014.  After one month plan is to taper off  tacrolimus (decrease from bid to qday for 1 week then reduce dose by 50% for one week then stop). Prednisone should then be the last to come   . Anemia   . Blood transfusion 2004    "when I had my transplant"  . Migraine   . Hyperparathyroidism, secondary   . Diabetic retinopathy   . Hypertension     takes Metoprolol and Exforge daily  . Depression     takes Klonopin nightly  . Diabetes mellitus type 1     Pt states diagnosed at age 65 with prior episodes of DKA. S/p pancreatic transplant   . GERD (gastroesophageal reflux disease)     takes Protonix daily  . Bronchitis   . Migraine   . Asthma   . Emphysema of lung   . Angina     none in past 2 years.  . Stomach pain     Hx: of chronic  . Pneumonia 2012    "double"    MEDICATIONS:  Prior to Admission medications   Medication Sig Start Date End Date Taking? Authorizing Provider  albuterol (PROVENTIL HFA;VENTOLIN HFA) 108 (90 BASE) MCG/ACT inhaler Inhale 2 puffs into the lungs every 4 (four) hours as needed for wheezing or shortness of breath (as per home regimen). 02/20/13   Kinnie Feil, MD  calcium acetate (PHOSLO) 667 MG capsule Take 1 capsule (667 mg total) by mouth 3 (three) times daily with meals. 05/10/13   Hosie Poisson, MD  clonazePAM (KLONOPIN) 0.5  MG tablet Take 1 mg by mouth at bedtime.     Historical Provider, MD  EXFORGE 10-160 MG per tablet Take 1 tablet by mouth at bedtime.  06/15/12   Historical Provider, MD  gabapentin (NEURONTIN) 100 MG capsule Take 100 mg by mouth daily.    Historical Provider, MD  glucose blood (ACCU-CHEK AVIVA) test strip Test BS 4x/day. Dx  251.2, 250.41 04/11/13   Clanford Marisa Hua, MD  insulin aspart (NOVOLOG) 100 UNIT/ML injection Inject 0-5 Units into the skin 3 (three) times daily with meals. 05/10/13   Hosie Poisson, MD  insulin glargine (LANTUS) 100 UNIT/ML injection Inject 0.05 mLs (5 Units total) into the skin 2 (two) times daily. 05/09/13   Hosie Poisson, MD  Lancets (ACCU-CHEK SOFT  TOUCH) lancets Use as instructed 04/11/13   Clanford Marisa Hua, MD  metoprolol succinate (TOPROL-XL) 100 MG 24 hr tablet Take 100 mg by mouth at bedtime. Take with or immediately following a meal.    Historical Provider, MD  montelukast (SINGULAIR) 10 MG tablet Take 10 mg by mouth at bedtime.    Historical Provider, MD  multivitamin (RENA-VIT) TABS tablet Take 1 tablet by mouth daily.    Historical Provider, MD  pantoprazole (PROTONIX) 40 MG tablet Take 1 tablet (40 mg total) by mouth at bedtime. 03/27/13   Hosie Poisson, MD  predniSONE (DELTASONE) 5 MG tablet Take 5 mg by mouth daily.  01/21/11   Historical Provider, MD  zolpidem (AMBIEN CR) 12.5 MG CR tablet Take 12.5 mg by mouth at bedtime.    Historical Provider, MD    ALLERGIES:  Allergies  Allergen Reactions  . Depakote [Divalproex Sodium] Other (See Comments)    Pt gets "delirious"  . Morphine And Related Nausea And Vomiting and Other (See Comments)    "makes me delirious"  . Penicillins Anaphylaxis    Tolerated Zosyn Dec 2014  . Tramadol Nausea And Vomiting  . Imitrex [Sumatriptan] Other (See Comments)    seizures  . Vicodin [Hydrocodone-Acetaminophen] Itching and Rash    SOCIAL HISTORY:  History  Substance Use Topics  . Smoking status: Never Smoker   . Smokeless tobacco: Never Used  . Alcohol Use: No    FAMILY HISTORY: Family History  Problem Relation Age of Onset  . Hypertension Mother   . Kidney disease Mother   . Diabetes Mother   . Hypertension Father   . Kidney disease Father   . Diabetes Father     EXAM: BP 125/65  Pulse 73  Resp 14  Ht 5\' 1"  (1.549 m)  Wt 150 lb (68.04 kg)  BMI 28.36 kg/m2  SpO2 97% CONSTITUTIONAL: Alert and oriented x3  and responds appropriately to questions. Well-appearing; well-nourished HEAD: Normocephalic EYES: Conjunctivae clear, PERRL ENT: normal nose; no rhinorrhea; moist mucous membranes; pharynx without lesions noted NECK: Supple, no meningismus, no LAD  CARD: RRR; S1  and S2 appreciated; no murmurs, no clicks, no rubs, no gallops RESP: Normal chest excursion without splinting or tachypnea; breath sounds clear and equal bilaterally; no wheezes, no rhonchi, no rales,  ABD/GI: Normal bowel sounds; non-distended; soft, non-tender, no rebound, no guarding BACK:  The back appears normal and is non-tender to palpation, there is no CVA tenderness EXT: Normal ROM in all joints; non-tender to palpation; no edema; normal capillary refill; no cyanosis    SKIN: Normal color for age and race; warm NEURO: Moves all extremities equally, sensation to light touch intact diffusely, cranial nerves II through XII intact  PSYCH: The patient's  mood and manner are appropriate. Grooming and personal hygiene are appropriate.  MEDICAL DECISION MAKING:  patient here with hypoglycemia that has improved since receiving glucagon. She is currently neurologically intact but still drowsy. Her repeat glucose is normal. She is hemodynamically stable. Given she is still drowsy, will obtain labs and CT head. Will continue to closely monitor. Suspect patient's glucose may have dropped secondary to viral illness.  ED PROGRESS: Patient's workup has been unremarkable other than a mild leukocytosis. Suspect patient may have pelvis causing her symptoms. She is also complaining of her migraine headache. Will give Compazine, Benadryl and reassess. She is feeling much better however it is more alert. Will repeat her blood sugar. Chest x-ray and head CT are negative.  8:01 PM Pt reports her headache has improved. She is ready for discharge home. Her glucose is still normal, slightly elevated. We'll discharge him with return cautions, supportive care instructions. Patient and daughter at bedside verbalize understanding and are comfortable plan.     EKG Interpretation    Date/Time:  Saturday June 27 2013 15:45:53 EST Ventricular Rate:  69 PR Interval:  213 QRS Duration: 92 QT Interval:  458 QTC  Calculation: 491 R Axis:   94 Text Interpretation:  Sinus rhythm Prolonged PR interval Left atrial enlargement Borderline right axis deviation Borderline prolonged QT interval No significant change since last tracing Confirmed by Zivah Mayr  DO, Caysie Minnifield ([id number]) on 06/27/2013 4:30:01 PM                Shafer, DO 06/27/13 2005

## 2013-06-27 NOTE — ED Notes (Signed)
MD Ward at bedside to update patient on care plan

## 2013-06-27 NOTE — Discharge Instructions (Signed)
Migraine Headache A migraine headache is an intense, throbbing pain on one or both sides of your head. A migraine can last for 30 minutes to several hours. CAUSES  The exact cause of a migraine headache is not always known. However, a migraine may be caused when nerves in the brain become irritated and release chemicals that cause inflammation. This causes pain. Certain things may also trigger migraines, such as:  Alcohol.  Smoking.  Stress.  Menstruation.  Aged cheeses.  Foods or drinks that contain nitrates, glutamate, aspartame, or tyramine.  Lack of sleep.  Chocolate.  Caffeine.  Hunger.  Physical exertion.  Fatigue.  Medicines used to treat chest pain (nitroglycerine), birth control pills, estrogen, and some blood pressure medicines. SIGNS AND SYMPTOMS  Pain on one or both sides of your head.  Pulsating or throbbing pain.  Severe pain that prevents daily activities.  Pain that is aggravated by any physical activity.  Nausea, vomiting, or both.  Dizziness.  Pain with exposure to bright lights, loud noises, or activity.  General sensitivity to bright lights, loud noises, or smells. Before you get a migraine, you may get warning signs that a migraine is coming (aura). An aura may include:  Seeing flashing lights.  Seeing bright spots, halos, or zig-zag lines.  Having tunnel vision or blurred vision.  Having feelings of numbness or tingling.  Having trouble talking.  Having muscle weakness. DIAGNOSIS  A migraine headache is often diagnosed based on:  Symptoms.  Physical exam.  A CT scan or MRI of your head. These imaging tests cannot diagnose migraines, but they can help rule out other causes of headaches. TREATMENT Medicines may be given for pain and nausea. Medicines can also be given to help prevent recurrent migraines.  HOME CARE INSTRUCTIONS  Only take over-the-counter or prescription medicines for pain or discomfort as directed by your  health care provider. The use of long-term narcotics is not recommended.  Lie down in a dark, quiet room when you have a migraine.  Keep a journal to find out what may trigger your migraine headaches. For example, write down:  What you eat and drink.  How much sleep you get.  Any change to your diet or medicines.  Limit alcohol consumption.  Quit smoking if you smoke.  Get 7 9 hours of sleep, or as recommended by your health care provider.  Limit stress.  Keep lights dim if bright lights bother you and make your migraines worse. SEEK IMMEDIATE MEDICAL CARE IF:   Your migraine becomes severe.  You have a fever.  You have a stiff neck.  You have vision loss.  You have muscular weakness or loss of muscle control.  You start losing your balance or have trouble walking.  You feel faint or pass out.  You have severe symptoms that are different from your first symptoms. MAKE SURE YOU:   Understand these instructions.  Will watch your condition.  Will get help right away if you are not doing well or get worse. Document Released: 05/14/2005 Document Revised: 03/04/2013 Document Reviewed: 01/19/2013 [facility] Patient Information 2014 [place].   Hypoglycemia (Low Blood Sugar) Hypoglycemia is when the glucose (sugar) in your blood is too low. Hypoglycemia can happen for many reasons. It can happen to people with or without diabetes. Hypoglycemia can develop quickly and can be a medical emergency.  CAUSES  Having hypoglycemia does not mean that you will develop diabetes. Different causes include:  Missed or delayed meals or not enough carbohydrates  eaten.  Medication overdose. This could be by accident or deliberate. If by accident, your medication may need to be adjusted or changed.  Exercise or increased activity without adjustments in carbohydrates or medications.  A nerve disorder that affects body functions like your heart rate, blood pressure and digestion  (autonomic neuropathy).  A condition where the stomach muscles do not function properly (gastroparesis). Therefore, medications may not absorb properly.  The inability to recognize the signs of hypoglycemia (hypoglycemic unawareness).  Absorption of insulin  may be altered.  Alcohol consumption.  Pregnancy/menstrual cycles/postpartum. This may be due to hormones.  Certain kinds of tumors. This is very rare. SYMPTOMS   Sweating.  Hunger.  Dizziness.  Blurred vision.  Drowsiness.  Weakness.  Headache.  Rapid heart beat.  Shakiness.  Nervousness. DIAGNOSIS  Diagnosis is made by monitoring blood glucose in one or all of the following ways:  Fingerstick blood glucose monitoring.  Laboratory results. TREATMENT  If you think your blood glucose is low:  Check your blood glucose, if possible. If it is less than 70 mg/dl, take one of the following:  3-4 glucose tablets.   cup juice (prefer clear like apple).   cup "regular" soda pop.  1 cup milk.  -1 tube of glucose gel.  5-6 hard candies.  Do not over treat because your blood glucose (sugar) will only go too high.  Wait 15 minutes and recheck your blood glucose. If it is still less than 70 mg/dl (or below your target range), repeat treatment.  Eat a snack if it is more than one hour until your next meal. Sometimes, your blood glucose may go so low that you are unable to treat yourself. You may need someone to help you. You may even pass out or be unable to swallow. This may require you to get an injection of glucagon, which raises the blood glucose. HOME CARE INSTRUCTIONS  Check blood glucose as recommended by your caregiver.  Take medication as prescribed by your caregiver.  Follow your meal plan. Do not skip meals. Eat on time.  If you are going to drink alcohol, drink it only with meals.  Check your blood glucose before driving.  Check your blood glucose before and after exercise. If you  exercise longer or different than usual, be sure to check blood glucose more frequently.  Always carry treatment with you. Glucose tablets are the easiest to carry.  Always wear medical alert jewelry or carry some form of identification that states that you have diabetes. This will alert people that you have diabetes. If you have hypoglycemia, they will have a better idea on what to do. SEEK MEDICAL CARE IF:   You are having problems keeping your blood sugar at target range.  You are having frequent episodes of hypoglycemia.  You feel you might be having side effects from your medicines.  You have symptoms of an illness that is not improving after 3-4 days.  You notice a change in vision or a new problem with your vision. SEEK IMMEDIATE MEDICAL CARE IF:   You are a family member or friend of a person whose blood glucose goes below 70 mg/dl and is accompanied by:  Confusion.  A change in mental status.  The inability to swallow.  Passing out. Document Released: 05/14/2005 Document Revised: 08/06/2011 Document Reviewed: 09/10/2011 [facility] Patient Information 2014 [place], [place].

## 2013-06-27 NOTE — ED Notes (Addendum)
IV attempted by 2 different nurses. IV team called.

## 2013-06-27 NOTE — ED Notes (Signed)
Patient presents to ED via EMS from home with complaints of low blood sugar. CBG 36 when EMS arrived, glucagon given CBG up to 105... EMS attempted IV.

## 2013-06-27 NOTE — ED Notes (Signed)
Patient walked to restroom

## 2013-08-22 IMAGING — CR DG CHEST 1V
1 series · 1 of 1 positions shown · non-contrast
Comparison: 07/14/2011

CLINICAL DATA: Cough, diabetes, hypertension, end-stage renal
disease on dialysis

CHEST - 1 VIEW

[w chest pa]
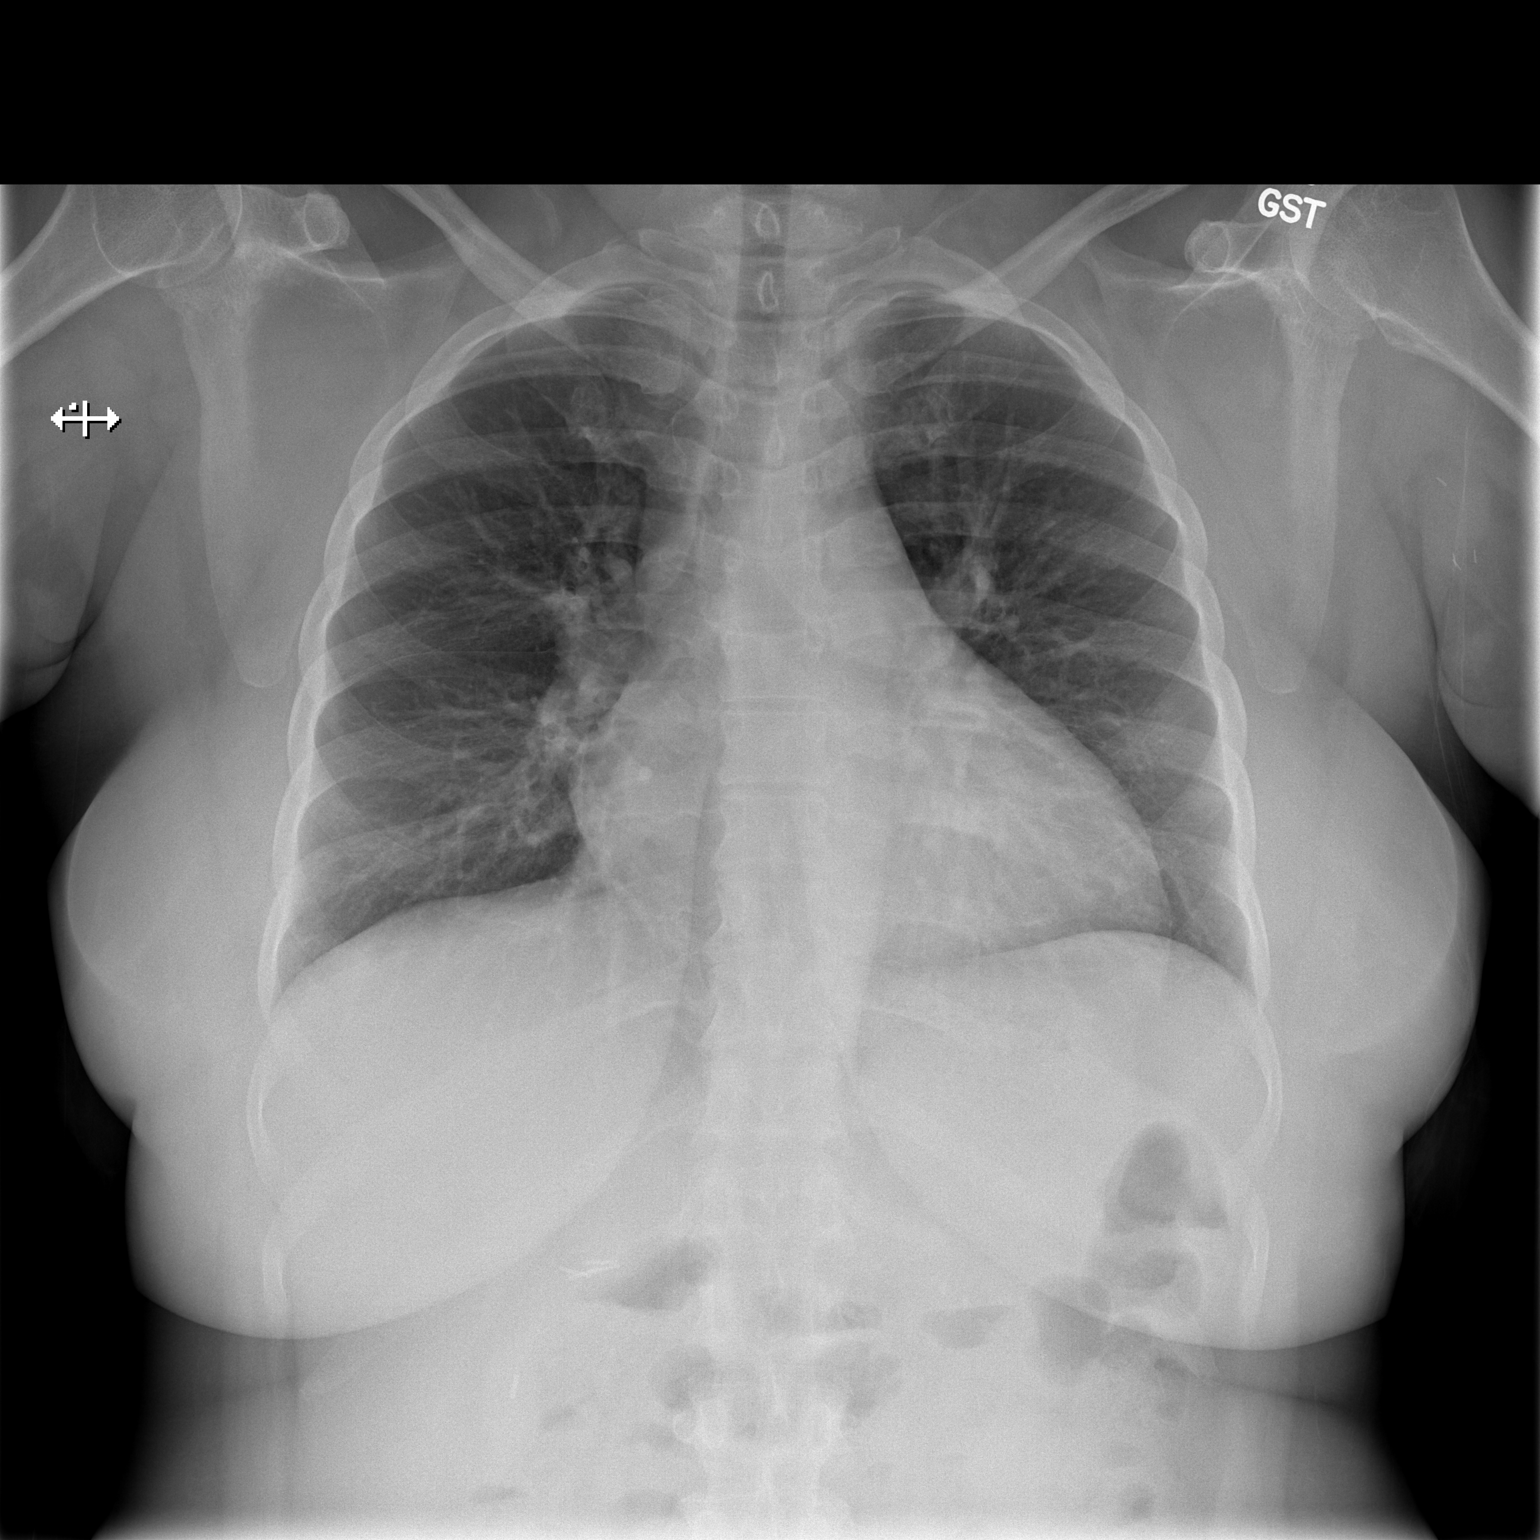

[1 of 1 positions shown; findings below may reference images not displayed]

FINDINGS: Enlargement of cardiac silhouette with pulmonary vascular
congestion.
Mediastinal contours stable.
Peribronchial thickening without infiltrate or effusion.
No pneumothorax.
No acute osseous findings.
IMPRESSION: Enlargement of cardiac silhouette with pulmonary vascular
congestion.
Mild bronchitic changes.

## 2013-08-22 IMAGING — CR DG ABDOMEN 1V
1 series · 1 of 1 positions shown · non-contrast
Comparison: None

CLINICAL DATA: Lower abdominal pain, nausea, vomiting

ABDOMEN - 1 VIEW

[t abdomen supine]
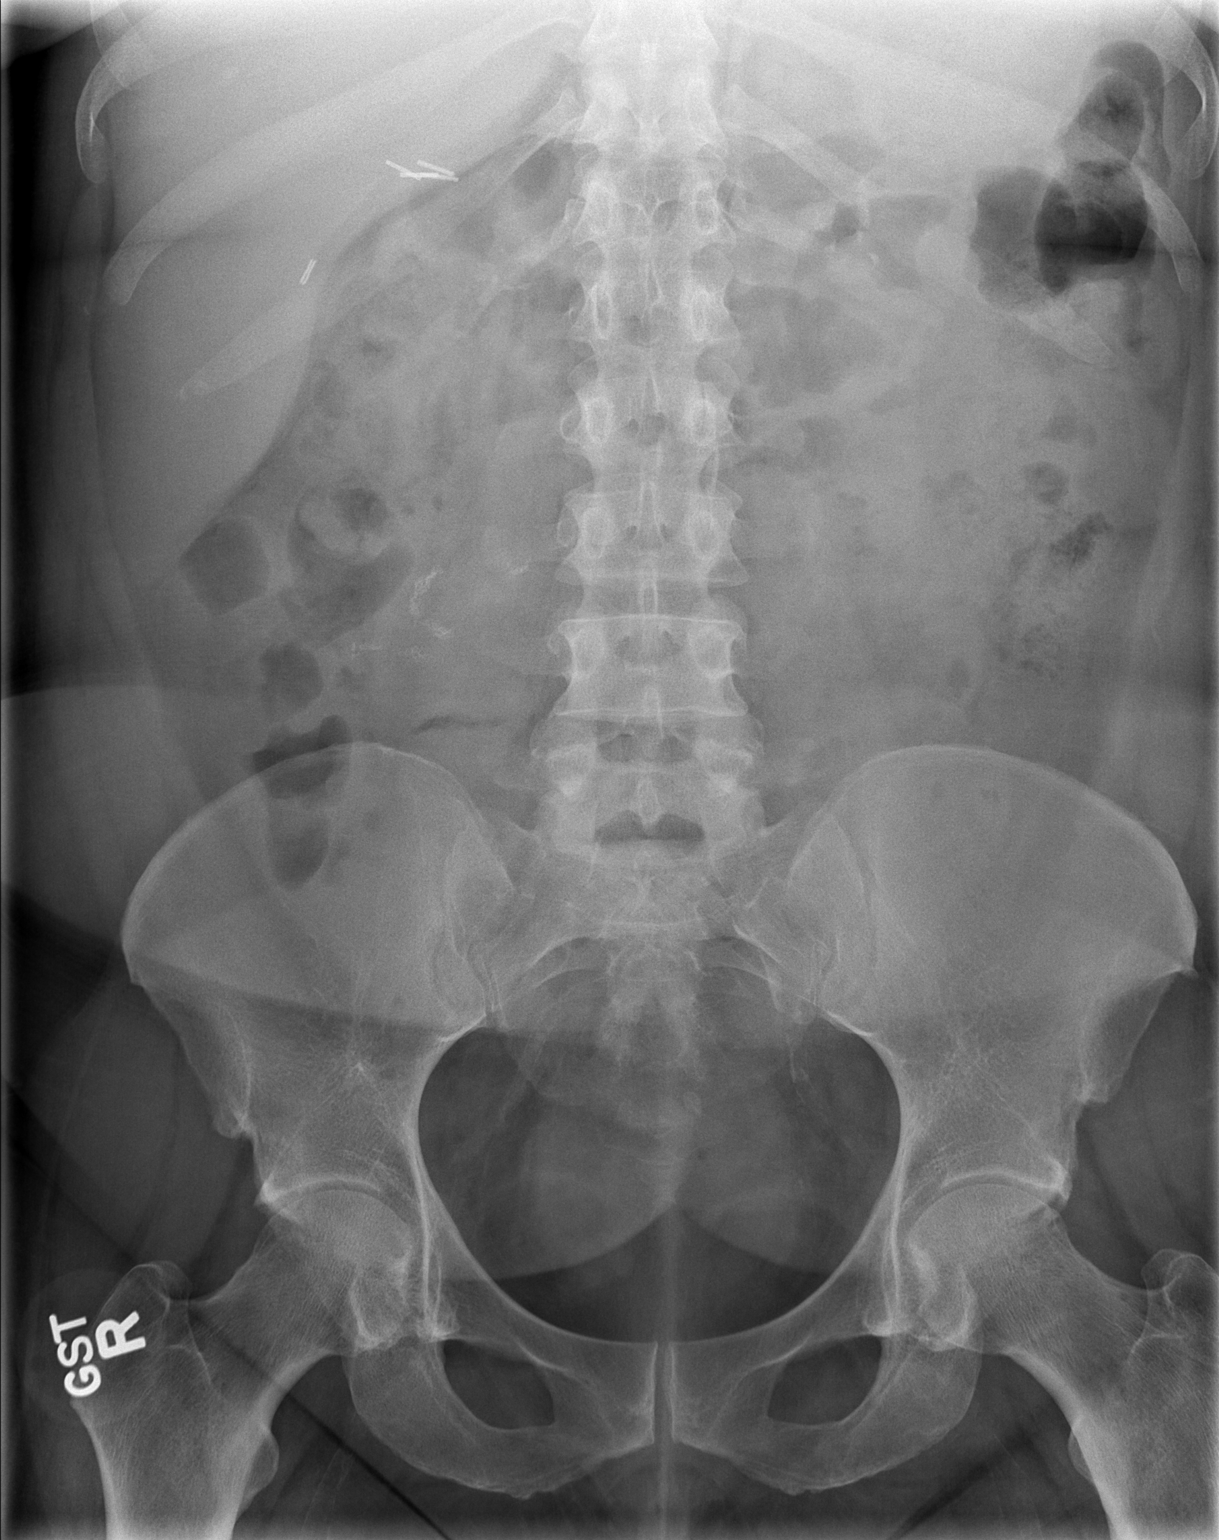

[1 of 1 positions shown; findings below may reference images not displayed]

FINDINGS: Surgical clips right upper quadrant question cholecystectomy.
Staples of right mid abdomen.
Nonobstructive bowel gas pattern.
No bowel dilatation or bowel wall thickening.
Bones unremarkable.
No urinary tract calcification.
Mild scattered atherosclerotic calcification noted.
IMPRESSION: Nonobstructive bowel gas pattern.

## 2013-09-18 IMAGING — CT CT HEAD W/O CM
1 of 2 series · 13 of 30 positions shown, 17 images · non-contrast
Comparison: 11/13/2010

CLINICAL DATA: Seizure.

CT HEAD WITHOUT CONTRAST
TECHNIQUE: Contiguous axial images were obtained from the base of
the skull through the vertex without contrast.

[Series 2: brain · axial · 0.49mm/px · z∈[+125,+258]mm · 13 of 32 slices shown, 17 images]
[im 3/32  brain]
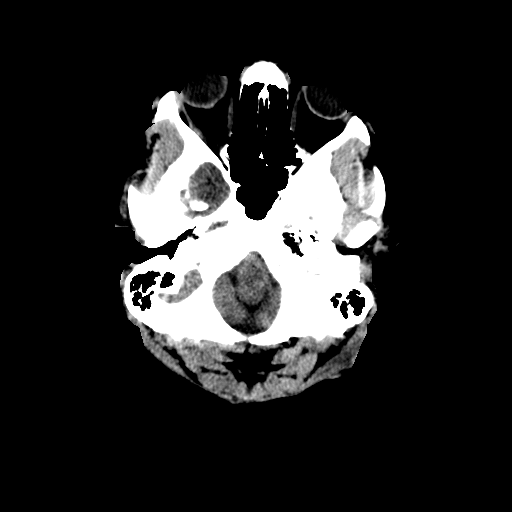
[im 3/32  bone]
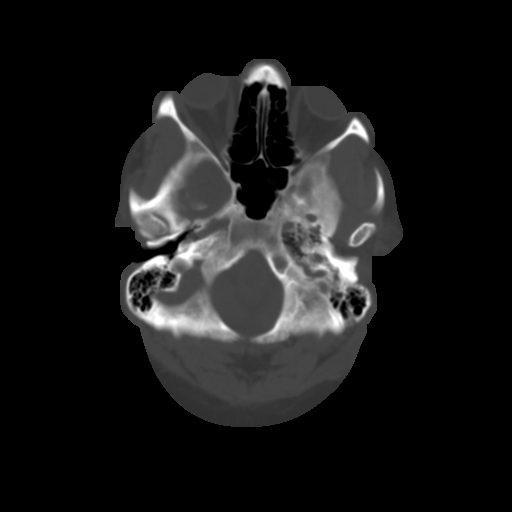
[im 5/32  brain]
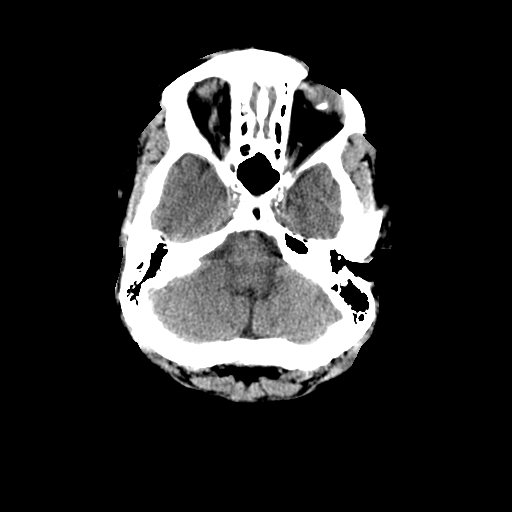
[im 7/32  brain]
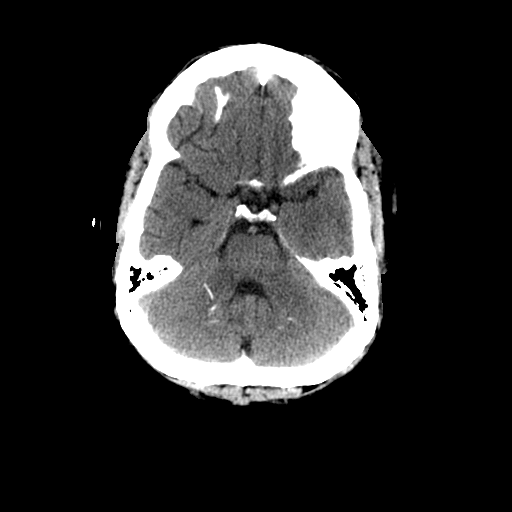
[im 9/32  brain]
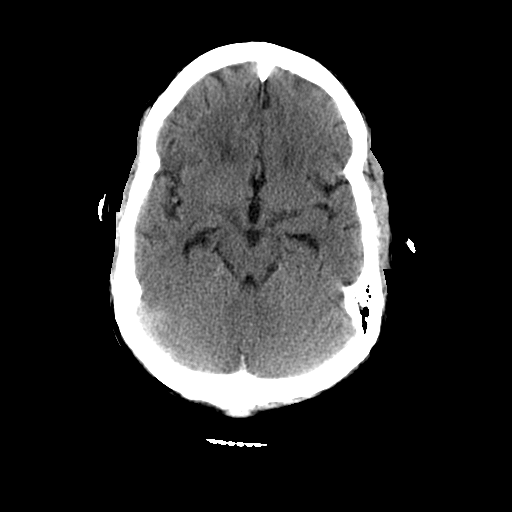
[im 12/32  brain]
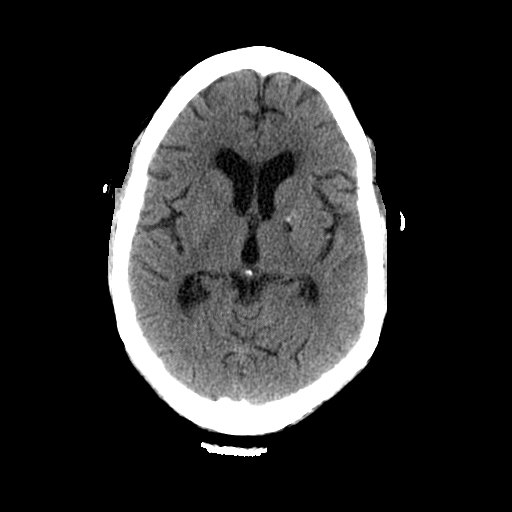
[im 12/32  bone]
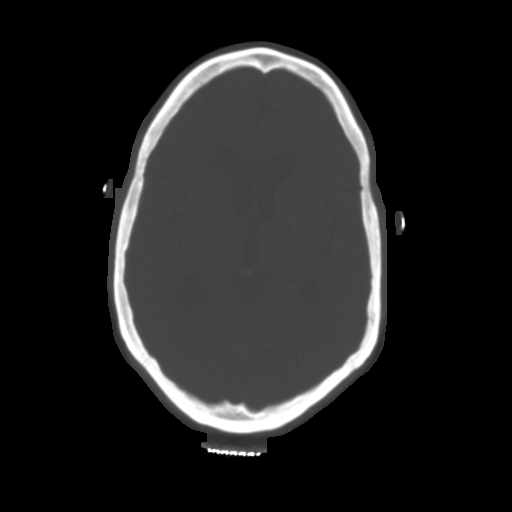
[im 14/32  brain]
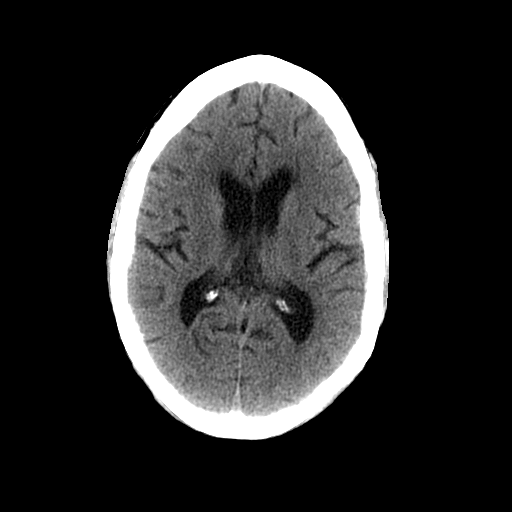
[im 16/32  brain]
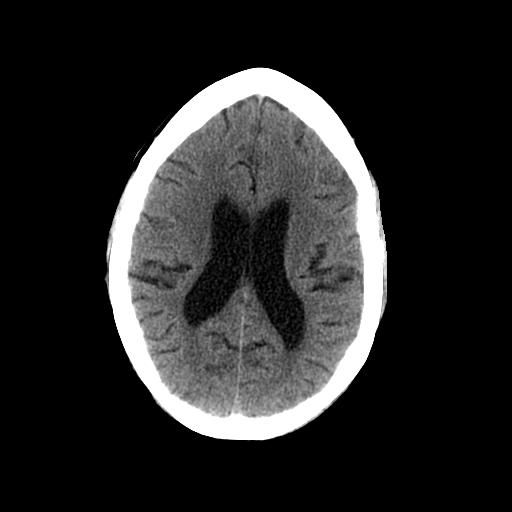
[im 18/32  brain]
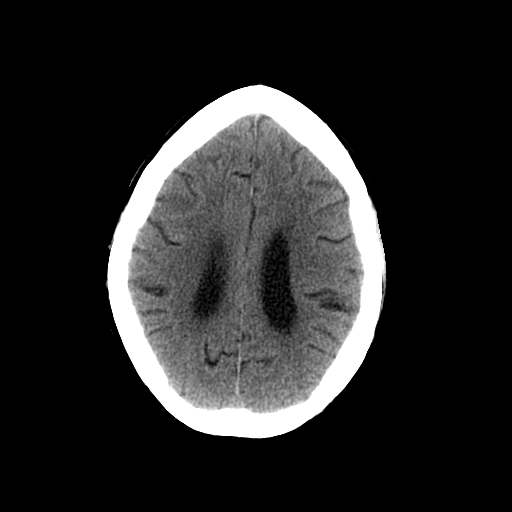
[im 20/32  brain]
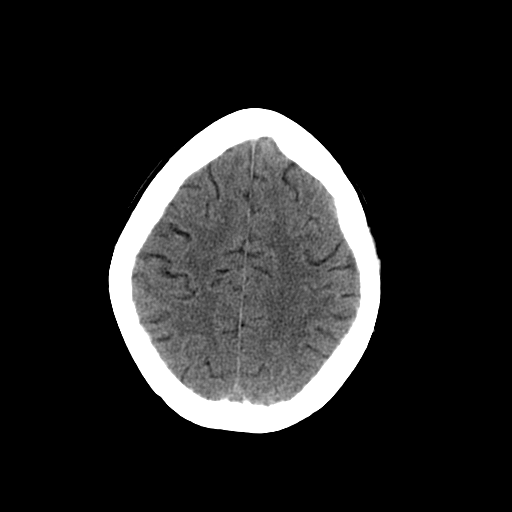
[im 20/32  bone]
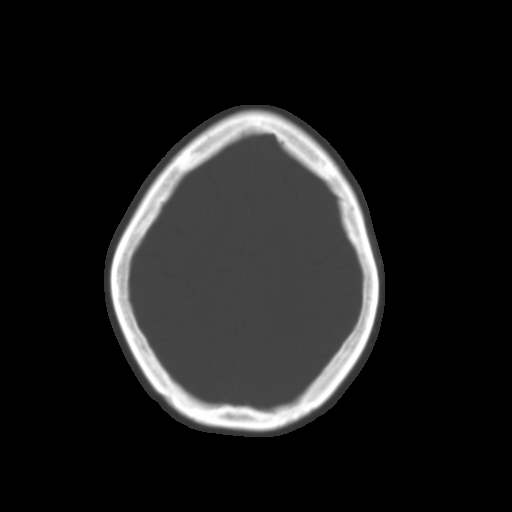
[im 23/32  brain]
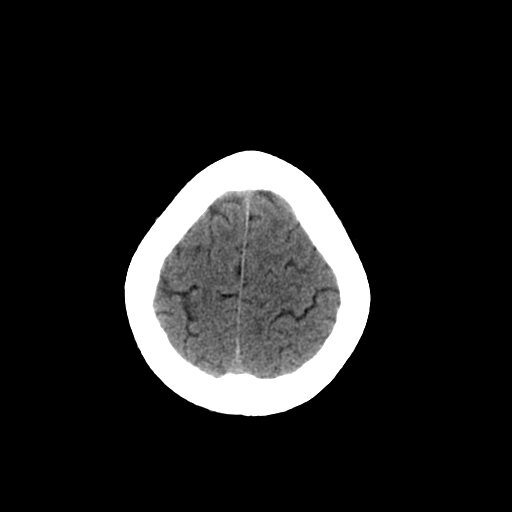
[im 25/32  brain]
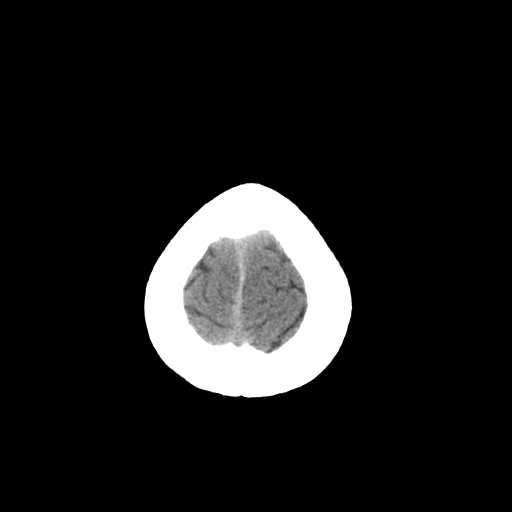
[im 27/32  brain]
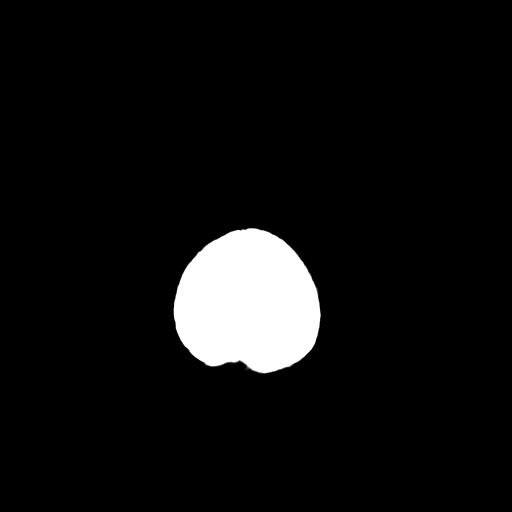
[im 29/32  brain]
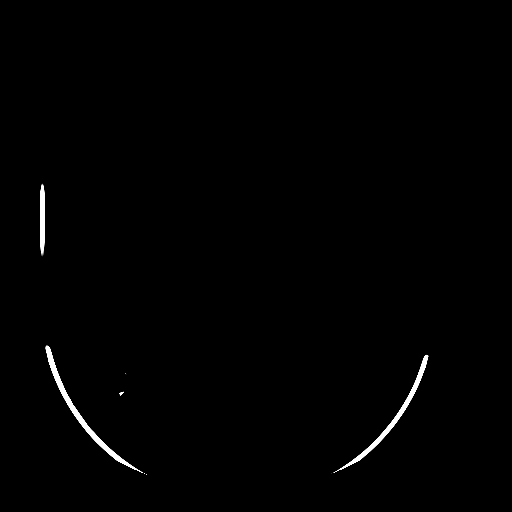
[im 29/32  bone]
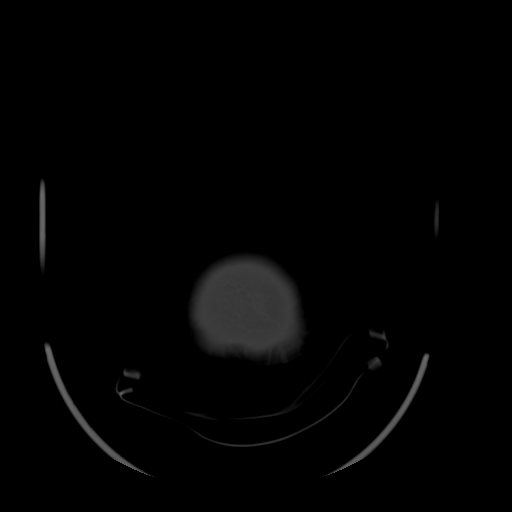

[13 of 30 positions shown; findings below may reference images not displayed]

FINDINGS: Globus pallidus and cerebellar dentate nucleus
calcifications are likely physiologic.

The brain stem, cerebral peduncles, and thalami appear
unremarkable.

The ventricular system and basilar cisterns appear normal.

There appears to be calcification along the upper margin of the
posterior chamber of the left globe, query retinal calcification.
Finding unchanged from 9556.

No intracranial hemorrhage, mass lesion, or acute infarction is
identified.
IMPRESSION: 1.  Calcifications of the dentate nuclei of the cerebellum and in
the globus pallidus nuclei, probably physiologic.
2.  Lucency in the left globus pallidus nucleus is probably a
dilated perivascular space and less likely a remote lacunar
infarct.
3.  Chronic calcification along the upper portion of the left
retina.

## 2013-10-14 ENCOUNTER — Other Ambulatory Visit: Payer: Self-pay | Admitting: Orthopedic Surgery

## 2013-10-15 ENCOUNTER — Encounter (HOSPITAL_BASED_OUTPATIENT_CLINIC_OR_DEPARTMENT_OTHER): Payer: Self-pay | Admitting: *Deleted

## 2013-10-15 NOTE — Progress Notes (Signed)
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

## 2013-10-16 ENCOUNTER — Encounter (HOSPITAL_BASED_OUTPATIENT_CLINIC_OR_DEPARTMENT_OTHER): Payer: Self-pay | Admitting: Certified Registered"

## 2013-10-16 ENCOUNTER — Ambulatory Visit (HOSPITAL_BASED_OUTPATIENT_CLINIC_OR_DEPARTMENT_OTHER): Payer: Medicare Other | Admitting: Certified Registered"

## 2013-10-16 ENCOUNTER — Ambulatory Visit (HOSPITAL_BASED_OUTPATIENT_CLINIC_OR_DEPARTMENT_OTHER)
Admission: RE | Admit: 2013-10-16 | Discharge: 2013-10-16 | Disposition: A | Discharge status: Home / Self Care | Payer: Medicare Other | Source: Ambulatory Visit | Attending: Orthopedic Surgery | Admitting: Orthopedic Surgery

## 2013-10-16 ENCOUNTER — Encounter (HOSPITAL_BASED_OUTPATIENT_CLINIC_OR_DEPARTMENT_OTHER)
Admission: RE | Disposition: A | Discharge status: Home / Self Care | Payer: Self-pay | Source: Ambulatory Visit | Attending: Orthopedic Surgery

## 2013-10-16 ENCOUNTER — Encounter (HOSPITAL_BASED_OUTPATIENT_CLINIC_OR_DEPARTMENT_OTHER): Payer: Medicare Other | Admitting: Certified Registered"

## 2013-10-16 DIAGNOSIS — E785 Hyperlipidemia, unspecified: Secondary | ICD-10-CM | POA: Insufficient documentation

## 2013-10-16 DIAGNOSIS — L659 Nonscarring hair loss, unspecified: Secondary | ICD-10-CM | POA: Insufficient documentation

## 2013-10-16 DIAGNOSIS — D509 Iron deficiency anemia, unspecified: Secondary | ICD-10-CM | POA: Insufficient documentation

## 2013-10-16 DIAGNOSIS — G5600 Carpal tunnel syndrome, unspecified upper limb: Secondary | ICD-10-CM | POA: Insufficient documentation

## 2013-10-16 DIAGNOSIS — N2581 Secondary hyperparathyroidism of renal origin: Secondary | ICD-10-CM | POA: Insufficient documentation

## 2013-10-16 DIAGNOSIS — Z794 Long term (current) use of insulin: Secondary | ICD-10-CM | POA: Insufficient documentation

## 2013-10-16 DIAGNOSIS — Z992 Dependence on renal dialysis: Secondary | ICD-10-CM | POA: Insufficient documentation

## 2013-10-16 DIAGNOSIS — Z6828 Body mass index (BMI) 28.0-28.9, adult: Secondary | ICD-10-CM | POA: Insufficient documentation

## 2013-10-16 DIAGNOSIS — F3289 Other specified depressive episodes: Secondary | ICD-10-CM | POA: Insufficient documentation

## 2013-10-16 DIAGNOSIS — G2581 Restless legs syndrome: Secondary | ICD-10-CM | POA: Insufficient documentation

## 2013-10-16 DIAGNOSIS — K219 Gastro-esophageal reflux disease without esophagitis: Secondary | ICD-10-CM | POA: Insufficient documentation

## 2013-10-16 DIAGNOSIS — E11319 Type 2 diabetes mellitus with unspecified diabetic retinopathy without macular edema: Secondary | ICD-10-CM | POA: Insufficient documentation

## 2013-10-16 DIAGNOSIS — E1039 Type 1 diabetes mellitus with other diabetic ophthalmic complication: Secondary | ICD-10-CM | POA: Insufficient documentation

## 2013-10-16 DIAGNOSIS — J45909 Unspecified asthma, uncomplicated: Secondary | ICD-10-CM | POA: Insufficient documentation

## 2013-10-16 DIAGNOSIS — J438 Other emphysema: Secondary | ICD-10-CM | POA: Insufficient documentation

## 2013-10-16 DIAGNOSIS — Z79899 Other long term (current) drug therapy: Secondary | ICD-10-CM | POA: Insufficient documentation

## 2013-10-16 DIAGNOSIS — F329 Major depressive disorder, single episode, unspecified: Secondary | ICD-10-CM | POA: Insufficient documentation

## 2013-10-16 DIAGNOSIS — Z880 Allergy status to penicillin: Secondary | ICD-10-CM | POA: Insufficient documentation

## 2013-10-16 DIAGNOSIS — N186 End stage renal disease: Secondary | ICD-10-CM | POA: Insufficient documentation

## 2013-10-16 DIAGNOSIS — I120 Hypertensive chronic kidney disease with stage 5 chronic kidney disease or end stage renal disease: Secondary | ICD-10-CM | POA: Insufficient documentation

## 2013-10-16 DIAGNOSIS — Z888 Allergy status to other drugs, medicaments and biological substances status: Secondary | ICD-10-CM | POA: Insufficient documentation

## 2013-10-16 DIAGNOSIS — E669 Obesity, unspecified: Secondary | ICD-10-CM | POA: Insufficient documentation

## 2013-10-16 DIAGNOSIS — Z885 Allergy status to narcotic agent status: Secondary | ICD-10-CM | POA: Insufficient documentation

## 2013-10-16 HISTORY — PX: CARPAL TUNNEL RELEASE: SHX101

## 2013-10-16 LAB — POCT I-STAT, CHEM 8
BUN: 35 mg/dL — ABNORMAL HIGH (ref 6–23)
CHLORIDE: 95 meq/L — AB (ref 96–112)
Calcium, Ion: 1.03 mmol/L — ABNORMAL LOW (ref 1.12–1.23)
Creatinine, Ser: 6.8 mg/dL — ABNORMAL HIGH (ref 0.50–1.10)
Glucose, Bld: 392 mg/dL — ABNORMAL HIGH (ref 70–99)
HCT: 38 % (ref 36.0–46.0)
Hemoglobin: 12.9 g/dL (ref 12.0–15.0)
Potassium: 4.7 mEq/L (ref 3.7–5.3)
Sodium: 130 mEq/L — ABNORMAL LOW (ref 137–147)
TCO2: 23 mmol/L (ref 0–100)

## 2013-10-16 LAB — GLUCOSE, CAPILLARY: Glucose-Capillary: 319 mg/dL — ABNORMAL HIGH (ref 70–99)

## 2013-10-16 SURGERY — CARPAL TUNNEL RELEASE
Anesthesia: General | Site: Wrist | Laterality: Left

## 2013-10-16 MED ORDER — OXYCODONE HCL 5 MG PO TABS
5.0000 mg | ORAL_TABLET | Freq: Once | ORAL | Status: AC | PRN
Start: 2013-10-16 — End: 2013-10-16
  Administered 2013-10-16: 5 mg via ORAL

## 2013-10-16 MED ORDER — ONDANSETRON HCL 4 MG/2ML IJ SOLN
INTRAMUSCULAR | Status: DC | PRN
  Administered 2013-10-16: 4 mg via INTRAVENOUS

## 2013-10-16 MED ORDER — MIDAZOLAM HCL 2 MG/2ML IJ SOLN
INTRAMUSCULAR | Status: AC
  Filled 2013-10-16: qty 2

## 2013-10-16 MED ORDER — VANCOMYCIN HCL IN DEXTROSE 1-5 GM/200ML-% IV SOLN
1000.0000 mg | INTRAVENOUS | Status: AC
  Administered 2013-10-16 (×2): 1000 mg via INTRAVENOUS

## 2013-10-16 MED ORDER — INSULIN ASPART 100 UNIT/ML ~~LOC~~ SOLN
10.0000 [IU] | Freq: Once | SUBCUTANEOUS | Status: AC
  Administered 2013-10-16: 10 [IU] via SUBCUTANEOUS

## 2013-10-16 MED ORDER — OXYCODONE-ACETAMINOPHEN 5-325 MG PO TABS: ORAL_TABLET | ORAL | Status: DC

## 2013-10-16 MED ORDER — FENTANYL CITRATE 0.05 MG/ML IJ SOLN: 50.0000 ug | INTRAMUSCULAR | Status: DC | PRN

## 2013-10-16 MED ORDER — SODIUM CHLORIDE 0.9 % IV SOLN
INTRAVENOUS | Status: DC
  Administered 2013-10-16: 13:00:00 via INTRAVENOUS

## 2013-10-16 MED ORDER — MIDAZOLAM HCL 5 MG/5ML IJ SOLN
INTRAMUSCULAR | Status: DC | PRN
  Administered 2013-10-16: 1 mg via INTRAVENOUS

## 2013-10-16 MED ORDER — VANCOMYCIN HCL IN DEXTROSE 1-5 GM/200ML-% IV SOLN
INTRAVENOUS | Status: AC
  Filled 2013-10-16: qty 200

## 2013-10-16 MED ORDER — LIDOCAINE HCL (CARDIAC) 20 MG/ML IV SOLN
INTRAVENOUS | Status: DC | PRN
  Administered 2013-10-16: 30 mg via INTRAVENOUS

## 2013-10-16 MED ORDER — CHLORHEXIDINE GLUCONATE 4 % EX LIQD: 60.0000 mL | Freq: Once | CUTANEOUS | Status: DC

## 2013-10-16 MED ORDER — BUPIVACAINE HCL (PF) 0.25 % IJ SOLN
INTRAMUSCULAR | Status: AC
  Filled 2013-10-16: qty 30

## 2013-10-16 MED ORDER — FENTANYL CITRATE 0.05 MG/ML IJ SOLN
INTRAMUSCULAR | Status: DC | PRN
  Administered 2013-10-16: 50 ug via INTRAVENOUS

## 2013-10-16 MED ORDER — LACTATED RINGERS IV SOLN: INTRAVENOUS | Status: DC

## 2013-10-16 MED ORDER — OXYCODONE HCL 5 MG PO TABS
ORAL_TABLET | ORAL | Status: AC
  Filled 2013-10-16: qty 1

## 2013-10-16 MED ORDER — SODIUM CHLORIDE 0.9 % IV SOLN
INTRAVENOUS | Status: DC | PRN
  Administered 2013-10-16: 13:00:00 via INTRAVENOUS

## 2013-10-16 MED ORDER — MIDAZOLAM HCL 2 MG/2ML IJ SOLN: 1.0000 mg | INTRAMUSCULAR | Status: DC | PRN

## 2013-10-16 MED ORDER — ONDANSETRON HCL 4 MG/2ML IJ SOLN: 4.0000 mg | Freq: Once | INTRAMUSCULAR | Status: DC | PRN

## 2013-10-16 MED ORDER — PROPOFOL 10 MG/ML IV BOLUS
INTRAVENOUS | Status: DC | PRN
  Administered 2013-10-16: 100 mg via INTRAVENOUS
  Administered 2013-10-16: 150 mg via INTRAVENOUS

## 2013-10-16 MED ORDER — INSULIN ASPART 100 UNIT/ML ~~LOC~~ SOLN
SUBCUTANEOUS | Status: AC
Start: 2013-10-16 — End: 2013-10-16
  Filled 2013-10-16: qty 1

## 2013-10-16 MED ORDER — FENTANYL CITRATE 0.05 MG/ML IJ SOLN
25.0000 ug | INTRAMUSCULAR | Status: DC | PRN
  Administered 2013-10-16: 50 ug via INTRAVENOUS
  Administered 2013-10-16: 25 ug via INTRAVENOUS

## 2013-10-16 MED ORDER — BUPIVACAINE HCL (PF) 0.25 % IJ SOLN
INTRAMUSCULAR | Status: DC | PRN
  Administered 2013-10-16: 8 mL

## 2013-10-16 MED ORDER — FENTANYL CITRATE 0.05 MG/ML IJ SOLN
INTRAMUSCULAR | Status: AC
  Filled 2013-10-16: qty 6

## 2013-10-16 MED ORDER — FENTANYL CITRATE 0.05 MG/ML IJ SOLN
INTRAMUSCULAR | Status: AC
  Filled 2013-10-16: qty 2

## 2013-10-16 SURGICAL SUPPLY — 36 items
BANDAGE ELASTIC 3 VELCRO ST LF (GAUZE/BANDAGES/DRESSINGS) ×2 IMPLANT
BLADE MINI RND TIP GREEN BEAV (BLADE) IMPLANT
BLADE SURG 15 STRL LF DISP TIS (BLADE) ×2 IMPLANT
BLADE SURG 15 STRL SS (BLADE) ×2
BNDG ESMARK 4X9 LF (GAUZE/BANDAGES/DRESSINGS) ×2 IMPLANT
BNDG GAUZE ELAST 4 BULKY (GAUZE/BANDAGES/DRESSINGS) ×2 IMPLANT
CHLORAPREP W/TINT 26ML (MISCELLANEOUS) ×2 IMPLANT
CORDS BIPOLAR (ELECTRODE) ×2 IMPLANT
COVER MAYO STAND STRL (DRAPES) ×2 IMPLANT
COVER TABLE BACK 60X90 (DRAPES) ×2 IMPLANT
CUFF TOURNIQUET SINGLE 18IN (TOURNIQUET CUFF) ×2 IMPLANT
DRAPE EXTREMITY T 121X128X90 (DRAPE) ×2 IMPLANT
DRAPE SURG 17X23 STRL (DRAPES) ×2 IMPLANT
DRSG PAD ABDOMINAL 8X10 ST (GAUZE/BANDAGES/DRESSINGS) ×2 IMPLANT
GAUZE SPONGE 4X4 12PLY STRL (GAUZE/BANDAGES/DRESSINGS) ×2 IMPLANT
GAUZE XEROFORM 1X8 LF (GAUZE/BANDAGES/DRESSINGS) ×2 IMPLANT
GLOVE BIO SURGEON STRL SZ7.5 (GLOVE) ×2 IMPLANT
GLOVE BIOGEL PI IND STRL 7.0 (GLOVE) ×1 IMPLANT
GLOVE BIOGEL PI IND STRL 8 (GLOVE) ×1 IMPLANT
GLOVE BIOGEL PI INDICATOR 7.0 (GLOVE) ×1
GLOVE BIOGEL PI INDICATOR 8 (GLOVE) ×1
GLOVE ECLIPSE 6.5 STRL STRAW (GLOVE) ×2 IMPLANT
GOWN STRL REUS W/ TWL LRG LVL3 (GOWN DISPOSABLE) ×1 IMPLANT
GOWN STRL REUS W/TWL LRG LVL3 (GOWN DISPOSABLE) ×1
GOWN STRL REUS W/TWL XL LVL3 (GOWN DISPOSABLE) ×2 IMPLANT
NEEDLE HYPO 25X1 1.5 SAFETY (NEEDLE) ×2 IMPLANT
NS IRRIG 1000ML POUR BTL (IV SOLUTION) ×2 IMPLANT
PACK BASIN DAY SURGERY FS (CUSTOM PROCEDURE TRAY) ×2 IMPLANT
PADDING CAST ABS 4INX4YD NS (CAST SUPPLIES) ×1
PADDING CAST ABS COTTON 4X4 ST (CAST SUPPLIES) ×1 IMPLANT
STOCKINETTE 4X48 STRL (DRAPES) ×2 IMPLANT
SUT ETHILON 4 0 PS 2 18 (SUTURE) ×2 IMPLANT
SYR BULB 3OZ (MISCELLANEOUS) ×2 IMPLANT
SYR CONTROL 10ML LL (SYRINGE) ×2 IMPLANT
TOWEL OR 17X24 6PK STRL BLUE (TOWEL DISPOSABLE) ×2 IMPLANT
UNDERPAD 30X30 INCONTINENT (UNDERPADS AND DIAPERS) ×2 IMPLANT

## 2013-10-16 NOTE — H&P (Signed)
[name] is an 43 y.o. female.   Chief Complaint: left carpal tunnel syndrome HPI: 43 yo female with pins and needles sensation in left hand.  Has had right carpal tunnel release with good results.  She wishes to have left carpal tunnel release.  She has nocturnal symptoms that wake her and cause her to shake her hands to gain relief.  Positive nerve conduction studies.  Past Medical History  Diagnosis Date  . Hyperlipidemia   . Alopecia   . Obesity   . Iron deficiency   . ESRD (end stage renal disease)     S/p pancreatic and kidney transplant, but back on HD 2008, dialysis at [facility], [place], Sat  . RLS (restless legs syndrome)   . Injury of conjunctiva and corneal abrasion of right eye without foreign body   . Cellulitis   . Immunosuppression 08/01/11    Dr Joelyn Oms spoke with [facility] transplant regarding immunosuppression (Apr 08, 2013). 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 [facility] stopped in November 2014.  After one month plan is to taper off tacrolimus (decrease from bid to qday for 1 week then reduce dose by 50% for one week then stop). Prednisone should then be the last to come   . Anemia   . Blood transfusion 2004    "when I had my transplant"  . Migraine   . Hyperparathyroidism, secondary   . Diabetic retinopathy   . Hypertension     takes Metoprolol and Exforge daily  . Depression     takes Klonopin nightly  . Diabetes mellitus type 1     Pt states diagnosed at age 58 with prior episodes of DKA. S/p pancreatic transplant   . GERD (gastroesophageal reflux disease)     takes Protonix daily  . Bronchitis   . Migraine   . Asthma   . Emphysema of lung   . Angina     none in past 2 years.  . Stomach pain     Hx: of chronic  . Pneumonia 2012    "double"  . Dialysis patient     tues,thurs,sat    Past Surgical History  Procedure Laterality Date  . 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 Thyroglobulin      x 7  . Av fistula placement      right upper arm  . Cholecystectomy  1995  .  hd graft placement/removal      "had 2 in my left upper arm"  . Eye surgery    . Retinopathy surgery    . Tooth extraction  10/10/11    X's two  . Insertion of dialysis catheter  12/08/2011    Procedure: INSERTION OF DIALYSIS CATHETER;  Surgeon: Angelia Mould, MD;  Location: [place];  Service: Vascular;  Laterality: Left;  . Av fistula placement  01/22/2012    Procedure: INSERTION OF ARTERIOVENOUS (AV) GORE-TEX GRAFT ARM;  Surgeon: Angelia Mould, MD;  Location: [place];  Service: Vascular;  Laterality: Left;  Ultrasound guided.  . Av fistula placement  03/14/2012    Procedure: INSERTION OF ARTERIOVENOUS (AV) GORE-TEX GRAFT THIGH;  Surgeon: Angelia Mould, MD;  Location: [place];  Service: Vascular;  Laterality: Right;  . False aneurysm repair Right 01/02/2013    Procedure: Excision of lymphocele in right thigh.;  Surgeon: Angelia Mould, MD;  Location: [place];  Service:  Vascular;  Laterality: Right;  . Carpal tunnel release Right 03/02/2013    Procedure: CARPAL TUNNEL RELEASE;  Surgeon: Tennis Must, MD;  Location: [place];  Service: Orthopedics;  Laterality: Right;  . Flexible sigmoidoscopy N/A 03/24/2013    Procedure: FLEXIBLE SIGMOIDOSCOPY;  Surgeon: Cleotis Nipper, MD;  Location: [facility] ENDOSCOPY;  Service: Endoscopy;  Laterality: N/A;  . Revision of arteriovenous goretex graft Right 04/02/2013    Procedure: REPAIR OF PSEUDOANEURYSM OF ARTERIOVENOUS GORETEX GRAFT;  Surgeon: Angelia Mould, MD;  Location: [facility] OR;  Service: Vascular;  Laterality: Right;    Family History  Problem Relation Age of Onset  . Hypertension Mother   . Kidney disease Mother   . Diabetes Mother   . Hypertension Father   . Kidney disease Father   . Diabetes Father    Social History:  reports that she has never smoked. She has never  used smokeless tobacco. She reports that she does not drink alcohol or use illicit drugs.  Allergies:  Allergies  Allergen Reactions  . Depakote [Divalproex Sodium] Other (See Comments)    Pt gets "delirious"  . Morphine And Related Nausea And Vomiting and Other (See Comments)    "makes me delirious"  . Penicillins Anaphylaxis    Tolerated Zosyn Dec 2014  . Tramadol Nausea And Vomiting  . Imitrex [Sumatriptan] Other (See Comments)    seizures  . Vicodin [Hydrocodone-Acetaminophen] Itching and Rash    Medications Prior to Admission  Medication Sig Dispense Refill  . calcium acetate (PHOSLO) 667 MG capsule Take 1 capsule (667 mg total) by mouth 3 (three) times daily with meals.  90 capsule  1  . clonazePAM (KLONOPIN) 0.5 MG tablet Take 1 mg by mouth at bedtime.       Marland Kitchen EXFORGE 10-160 MG per tablet Take 1 tablet by mouth at bedtime. Take on tues,thur, Saturday and sunday      . FLUoxetine (PROZAC) 20 MG capsule Take 20 mg by mouth at bedtime.      . gabapentin (NEURONTIN) 100 MG capsule Take 100 mg by mouth daily.      . insulin aspart (NOVOLOG FLEXPEN) 100 UNIT/ML FlexPen Inject 6 Units into the skin 2 (two) times daily with a meal.      . Insulin Glargine (LANTUS) 100 UNIT/ML Solostar Pen Inject 7 Units into the skin 2 (two) times daily with a meal.      . metoprolol succinate (TOPROL-XL) 100 MG 24 hr tablet Take 100 mg by mouth at bedtime. Take with or immediately following a meal.      . montelukast (SINGULAIR) 10 MG tablet Take 10 mg by mouth at bedtime.      . multivitamin (RENA-VIT) TABS tablet Take 1 tablet by mouth daily.      . pantoprazole (PROTONIX) 40 MG tablet Take 1 tablet (40 mg total) by mouth at bedtime.  30 tablet  0  . zolpidem (AMBIEN CR) 12.5 MG CR tablet Take 12.5 mg by mouth at bedtime.      Marland Kitchen albuterol (PROVENTIL HFA;VENTOLIN HFA) 108 (90 BASE) MCG/ACT inhaler Inhale 2 puffs into the lungs every 4 (four) hours as needed for wheezing or shortness of breath (as per  home regimen).  1 Inhaler  0    No results found for this or any previous visit (from the past 48 hour(s)).  No results found.   A comprehensive review of systems was negative except for: Behavioral/Psych: positive for anxiety, depression and sleep disturbance  Blood pressure 154/88, pulse 77, temperature 98.3 F (36.8 C), temperature source Oral, resp. rate 18, height 5\' 1"  (1.549 m), weight 68.663 kg (151 lb 6 oz), last menstrual period 10/09/2013, SpO2 100.00%.  General appearance: alert, cooperative and appears stated age Head: Normocephalic, without obvious abnormality, atraumatic Neck: supple, symmetrical, trachea midline Resp: clear to auscultation bilaterally Cardio: regular rate and rhythm GI: non tender Extremities: intact sensation and capillary refill all digits.  +epl/fpl/io.  skin intact. Pulses: 2+ and symmetric Skin: Skin color, texture, turgor normal. No rashes or lesions Neurologic: Grossly normal Incision/Wound: none  Assessment/Plan Left carpal tunnel syndrome.  Non operative and operative treatment options were discussed with the patient and patient wishes to proceed with operative treatment. Risks, benefits, and alternatives of surgery were discussed and the patient agrees with the plan of care.   Tennis Must 10/16/2013, 12:54 PM

## 2013-10-16 NOTE — Op Note (Signed)
[id number] 

## 2013-10-16 NOTE — Brief Op Note (Signed)
10/16/2013  2:20 PM  PATIENT:  Sterling Big  43 y.o. female  PRE-OPERATIVE DIAGNOSIS:  LEFT CARPAL TUNNEL SYNDROME  POST-OPERATIVE DIAGNOSIS:  LEFT CARPAL TUNNEL SYNDROME  PROCEDURE:  Procedure(s): LEFT CARPAL TUNNEL RELEASE (Left)  SURGEON:  Surgeon(s) and Role:    * Tennis Must, MD - Primary  PHYSICIAN ASSISTANT:   ASSISTANTS: none   ANESTHESIA:   general  EBL:  Total I/O In: 100 [I.V.:100] Out: -   BLOOD ADMINISTERED:none  DRAINS: none   LOCAL MEDICATIONS USED:  MARCAINE     SPECIMEN:  No Specimen  DISPOSITION OF SPECIMEN:  N/A  COUNTS:  YES  TOURNIQUET:   Total Tourniquet Time Documented: Forearm (Left) - 19 minutes Total: Forearm (Left) - 19 minutes   DICTATION: .Other Dictation: Dictation Number [id number]  PLAN OF CARE: Discharge to home after PACU  PATIENT DISPOSITION:  PACU - hemodynamically stable.

## 2013-10-16 NOTE — Anesthesia Preprocedure Evaluation (Addendum)
Anesthesia Evaluation  Patient identified by MRN, date of birth, ID band Patient awake    Reviewed: Allergy & Precautions, H&P , NPO status , Patient's Chart, lab work & pertinent test results  Airway Mallampati: II TM Distance: >3 FB     Dental  (+) Teeth Intact, Dental Advisory Given   Pulmonary  breath sounds clear to auscultation        Cardiovascular hypertension, Rhythm:Regular Rate:Normal     Neuro/Psych    GI/Hepatic   Endo/Other  diabetes  Renal/GU      Musculoskeletal   Abdominal   Peds  Hematology   Anesthesia Other Findings   Reproductive/Obstetrics                         Anesthesia Physical Anesthesia Plan  ASA: III  Anesthesia Plan:    Post-op Pain Management:    Induction:   Airway Management Planned: LMA  Additional Equipment:   Intra-op Plan:   Post-operative Plan:   Informed Consent: I have reviewed the patients History and Physical, chart, labs and discussed the procedure including the risks, benefits and alternatives for the proposed anesthesia with the patient or authorized representative who has indicated his/her understanding and acceptance.   Dental advisory given  Plan Discussed with: CRNA and Anesthesiologist  Anesthesia Plan Comments:        Anesthesia Quick Evaluation

## 2013-10-16 NOTE — Transfer of Care (Signed)
Immediate Anesthesia Transfer of Care Note  Patient: [name]  Procedure(s) Performed: Procedure(s): LEFT CARPAL TUNNEL RELEASE (Left)  Patient Location: PACU  Anesthesia Type:General  Level of Consciousness: awake and patient cooperative  Airway & Oxygen Therapy: Patient Spontanous Breathing and Patient connected to face mask oxygen  Post-op Assessment: Report given to PACU RN and Post -op Vital signs reviewed and stable  Post vital signs: Reviewed and stable  Complications: No apparent anesthesia complications

## 2013-10-16 NOTE — Anesthesia Procedure Notes (Signed)
Procedure Name: LMA Insertion Date/Time: 10/16/2013 1:47 PM Performed by: Gabbriella Presswood Pre-anesthesia Checklist: Patient identified, Emergency Drugs available, Suction available and Patient being monitored Patient Re-evaluated:Patient Re-evaluated prior to inductionOxygen Delivery Method: Circle System Utilized Preoxygenation: Pre-oxygenation with 100% oxygen Intubation Type: IV induction Ventilation: Mask ventilation without difficulty LMA: LMA inserted LMA Size: 4.0 Number of attempts: 1 Airway Equipment and Method: bite block Placement Confirmation: positive ETCO2 Tube secured with: Tape Dental Injury: Teeth and Oropharynx as per pre-operative assessment

## 2013-10-16 NOTE — Discharge Instructions (Addendum)

## 2013-10-16 NOTE — Anesthesia Postprocedure Evaluation (Signed)
  Anesthesia Post-op Note  Patient: [name]  Procedure(s) Performed: Procedure(s): LEFT CARPAL TUNNEL RELEASE (Left)  Patient Location: PACU  Anesthesia Type:General  Level of Consciousness: awake, alert  and oriented  Airway and Oxygen Therapy: Patient Spontanous Breathing and Patient connected to nasal cannula oxygen  Post-op Pain: mild  Post-op Assessment: Post-op Vital signs reviewed, Patient's Cardiovascular Status Stable, Respiratory Function Stable, Patent Airway and Pain level controlled  Post-op Vital Signs: stable  Last Vitals:  Filed Vitals:   10/16/13 1556  BP: 136/69  Pulse: 70  Temp: 36.6 C  Resp: 16    Complications: No apparent anesthesia complications

## 2013-10-17 NOTE — Op Note (Signed)
NAMENEMA, OATLEY NO.:  [id number]  MEDICAL RECORD NO.:  [id number]  LOCATION:                                 FACILITY:  PHYSICIAN:  Leanora Cover, MD        DATE OF BIRTH:  Feb 15, 1971  DATE OF PROCEDURE:  10/16/2013 DATE OF DISCHARGE:                              OPERATIVE REPORT   PREOPERATIVE DIAGNOSIS:  Left carpal tunnel syndrome.  POSTOPERATIVE DIAGNOSIS:  Left carpal tunnel syndrome.  PROCEDURE:  Left carpal tunnel release.  SURGEON:  Leanora Cover, MD  ASSISTANT:  None.  ANESTHESIA:  General.  IV FLUIDS:  Per Anesthesia flow sheet.  ESTIMATED BLOOD LOSS:  Minimal.  COMPLICATIONS:  None.  SPECIMENS:  None.  TOURNIQUET TIME:  19 minutes.  DISPOSITION:  Stable to PACU.  INDICATIONS:  Ms. Audie Box is a 43 year old female who has had pins and needle sensation and pain in her left hand.  She has positive nerve conduction studies.  She has nocturnal symptoms that wakes her at night, causing her shake her hand, getting relief.  She has had a right carpal tunnel release and wishes to have a left carpal tunnel release.  Risks, benefits, and alternatives of surgery were discussed including risk of blood loss; infection; damage to nerves, vessels, tendons, ligaments, bone; failure of surgery; need for additional surgery; complications with wound healing; continued pain; and continued carpal tunnel syndrome.  She voiced understanding of these risks and elected to proceed.  OPERATIVE COURSE:  After being identified preoperatively by myself, the patient and I agreed upon the procedure and site of the procedure. Surgical site was marked.  The risks, benefits, and alternatives of surgery were reviewed and she wished to proceed.  Surgical consent had been signed.  She was given IV Ancef as preoperative antibiotic prophylaxis.  She was transferred to the operating room and placed on the operative table in supine position with the left upper extremity  on arm board.  General anesthesia was induced by the anesthesiologist.  The left upper extremity was prepped and draped in normal sterile orthopedic fashion.  Surgical pause was performed between surgeons, Anesthesia, and operating staff, and all were in agreement as to the patient, procedure, and site of procedure.  Tourniquet at the proximal aspect of the Extremity was inflated to 250 mmHg after exsanguination of the limb with Esmarch bandage.  Incision was made over the transverse carpal ligament and carried into subcutaneous tissues by spreading technique.  Bipolar electrocautery was used to obtain hemostasis.  The palmar fascia was identified and was incised sharply.  The transverse carpal ligament was identified.  It was incised sharply with the knife.  It was incised distally.  Care was taken to ensure complete decompression distally.  It was then incised proximally.  The scissors were used to split the distal aspect of the volar antebrachial fascia.  Finger was placed into the wound to ensure complete decompression which was the case.  The nerve was inspected.  It was flattened and hyperemic.  The motor branch was identified and was intact.  The wound was copiously irrigated with sterile saline.  It was closed with 4-0 nylon in a horizontal  mattress fashion.  It was injected with 8 mL of 0.25% plain Marcaine to aid in postoperative analgesia.  It was then dressed with sterile Xeroform, 4x4, and ABD, and wrapped with Kerlix and Ace bandage.  Tourniquet was deflated at 19 minutes.  Fingertips were pink with brisk capillary refill after deflation of the tourniquet.  Operative drapes were broken down and the patient was awoken from anesthesia safely.  She was transferred back to stretcher and taken to the PACU in stable condition. I will see her back in the office in 1 to 2 weeks for postoperative followup.  I will give her Percocet 5/325, 1 to 2 p.o. q.6 hours p.r.n. pain, dispensed  #30.     Leanora Cover, MD     KK/MEDQ  D:  10/16/2013  T:  10/17/2013  Job:  [id number]

## 2013-10-20 ENCOUNTER — Encounter (HOSPITAL_BASED_OUTPATIENT_CLINIC_OR_DEPARTMENT_OTHER): Payer: Self-pay | Admitting: Orthopedic Surgery

## 2013-11-07 ENCOUNTER — Emergency Department (INDEPENDENT_AMBULATORY_CARE_PROVIDER_SITE_OTHER)
Admission: EM | Admit: 2013-11-07 | Discharge: 2013-11-07 | Disposition: A | Discharge status: Home / Self Care | Payer: Medicare Other | Source: Home / Self Care | Attending: Emergency Medicine | Admitting: Emergency Medicine

## 2013-11-07 ENCOUNTER — Encounter (HOSPITAL_COMMUNITY): Payer: Self-pay | Admitting: Emergency Medicine

## 2013-11-07 DIAGNOSIS — G5600 Carpal tunnel syndrome, unspecified upper limb: Secondary | ICD-10-CM

## 2013-11-07 MED ORDER — OXYCODONE-ACETAMINOPHEN 5-325 MG PO TABS: ORAL_TABLET | ORAL | Status: DC

## 2013-11-07 NOTE — ED Provider Notes (Signed)
  Chief Complaint   Chief Complaint  Patient presents with  . Hand Pain    History of Present Illness   [name] is a 43 year old female who had carpal tunnel surgery done about a month ago by Dr. Fredna Dow. The hand is still painful. There is no swelling, no erythema, no purulent drainage. The fingers feel numb and tingly. She has a decreased range of motion. She has not gotten in touch with Dr. Fredna Dow. She denies any trauma or injury.  Review of Systems   Other than as noted above, the patient denies any of the following symptoms: Systemic:  No fevers or chills. Musculoskeletal:  No joint pain or arthritis.  Neurological:  No muscular weakness or paresthesias.  [place]   Past medical history, family history, social history, meds, and allergies were reviewed.   She has chronic kidney disease and is on dialysis, has diabetes, and hypertension. Current meds include albuterol, PhosLo, clonazepam, Exforge, Prozac, Neurontin, NovoLog, Lantus, Toprol, Singulair, and Protonix.  Physical Examination   Vital signs:  BP 107/65  Pulse 95  Temp(Src) 98.7 F (37.1 C) (Oral)  Resp 18  SpO2 100%  LMP 10/09/2013 Gen:  Alert and oriented times 3.  In no distress. Musculoskeletal:  Exam of the hand reveals a hypertrophic scar on the volar aspect of the hand. There is no erythema or purulent drainage. The entire hand and wrist are extremely tender to palpation, in fact she would let me examine her at all because of severe pain. She's only able to move her fingers a little bit and has a decreased range of motion. She does have good sensation and capillary refill. Radial pulses were full.  Otherwise, all joints had a full a ROM with no swelling, bruising or deformity.  No edema, pulses full. Extremities were warm and pink.  Capillary refill was brisk.  Skin:  Clear, warm and dry.  No rash. Neuro:  Alert and oriented times 3.  Muscle strength was normal.  Sensation was intact to light touch.    Assessment   The encounter diagnosis was Carpal tunnel syndrome.  Has delayed healing and appears to be developing a chronic pain syndrome. Suggested she followup with Dr. Fredna Dow as soon as possible.  Plan  1.  Meds:  The following meds were prescribed:   Discharge Medication List as of 11/07/2013  4:31 PM    START taking these medications   Details  !! oxyCODONE-acetaminophen (PERCOCET) 5-325 MG per tablet 1 to 2 tablets every 6 hours as needed for pain., Print     !! - Potential duplicate medications found. Please discuss with provider.      2.  Patient Education/Counseling:  The patient was given appropriate handouts, self care instructions, and instructed in symptomatic relief, including rest and activity, and elevation.   3.  Follow up:  The patient was told to follow up here if no better in 3 to 4 days, or sooner if becoming worse in any way, and given some red flag symptoms such as worsening pain, fever, swelling, or neurological symptoms which would prompt immediate return.        Harden Mo, MD 11/07/13 2102

## 2013-11-07 NOTE — Discharge Instructions (Signed)
Carpal Tunnel Release Carpal tunnel release is done to relieve the pressure on the nerves and tendons on the bottom side of your wrist.  LET YOUR CAREGIVER KNOW ABOUT:   Allergies to food or medicine.  Medicines taken, including vitamins, herbs, eyedrops, over-the-counter medicines, and creams.  Use of steroids (by mouth or creams).  Previous problems with anesthetics or numbing medicines.  History of bleeding problems or blood clots.  Previous surgery.  Other health problems, including diabetes and kidney problems.  Possibility of pregnancy, if this applies. RISKS AND COMPLICATIONS  Some problems that may happen after this procedure include:  Infection.  Damage to the nerves, arteries or tendons could occur. This would be very uncommon.  Bleeding. BEFORE THE PROCEDURE   This surgery may be done while you are asleep (general anesthetic) or may be done under a block where only your forearm and the surgical area is numb.  If the surgery is done under a block, the numbness will gradually wear off within several hours after surgery. HOME CARE INSTRUCTIONS   Have a responsible person with you for 24 hours.  Do not drive a car or use public transportation for 24 hours.  Only take over-the-counter or prescription medicines for pain, discomfort, or fever as directed by your caregiver. Take them as directed.  You may put ice on the palm side of the affected wrist.  Put ice in a plastic bag.  Place a towel between your skin and the bag.  Leave the ice on for 20 to 30 minutes, 4 times per day.  If you were given a splint to keep your wrist from bending, use it as directed. It is important to wear the splint at night or as directed. Use the splint for as long as you have pain or numbness in your hand, arm, or wrist. This may take 1 to 2 months.  Keep your hand raised (elevated) above the level of your heart as much as possible. This keeps swelling down and helps with  discomfort.  Change bandages (dressings) as directed.  Keep the wound clean and dry. SEEK MEDICAL CARE IF:   You develop pain not relieved with medications.  You develop numbness of your hand.  You develop bleeding from your surgical site.  You have an oral temperature above 102 F (38.9 C).  You develop redness or swelling of the surgical site.  You develop new, unexplained problems. SEEK IMMEDIATE MEDICAL CARE IF:   You develop a rash.  You have difficulty breathing.  You develop any reaction or side effects to medications given. Document Released: 08/04/2003 Document Revised: 08/06/2011 Document Reviewed: 03/20/2007 Exit[employer] Patient Information 2014 [employer].  

## 2013-11-07 NOTE — ED Notes (Signed)
Patient states had carpal tunnel surgery about month or so ago Complains of pain to left hand for past three weeks that is so painful It is keeping her up at night

## 2013-11-23 ENCOUNTER — Emergency Department (HOSPITAL_COMMUNITY): Payer: Medicare Other

## 2013-11-23 ENCOUNTER — Encounter (HOSPITAL_COMMUNITY): Payer: Self-pay | Admitting: Emergency Medicine

## 2013-11-23 ENCOUNTER — Inpatient Hospital Stay (HOSPITAL_COMMUNITY)
Admission: EM | Admit: 2013-11-23 | Discharge: 2013-12-01 | DRG: 871 | Disposition: A | Discharge status: Home / Self Care | Payer: Medicare Other | Attending: Internal Medicine | Admitting: Internal Medicine

## 2013-11-23 DIAGNOSIS — G9340 Encephalopathy, unspecified: Secondary | ICD-10-CM

## 2013-11-23 DIAGNOSIS — E1110 Type 2 diabetes mellitus with ketoacidosis without coma: Secondary | ICD-10-CM | POA: Diagnosis present

## 2013-11-23 DIAGNOSIS — K219 Gastro-esophageal reflux disease without esophagitis: Secondary | ICD-10-CM | POA: Diagnosis present

## 2013-11-23 DIAGNOSIS — J45909 Unspecified asthma, uncomplicated: Secondary | ICD-10-CM | POA: Diagnosis present

## 2013-11-23 DIAGNOSIS — E11319 Type 2 diabetes mellitus with unspecified diabetic retinopathy without macular edema: Secondary | ICD-10-CM | POA: Diagnosis present

## 2013-11-23 DIAGNOSIS — R6520 Severe sepsis without septic shock: Secondary | ICD-10-CM

## 2013-11-23 DIAGNOSIS — E1365 Other specified diabetes mellitus with hyperglycemia: Secondary | ICD-10-CM

## 2013-11-23 DIAGNOSIS — IMO0002 Reserved for concepts with insufficient information to code with codable children: Secondary | ICD-10-CM

## 2013-11-23 DIAGNOSIS — F329 Major depressive disorder, single episode, unspecified: Secondary | ICD-10-CM | POA: Diagnosis present

## 2013-11-23 DIAGNOSIS — E871 Hypo-osmolality and hyponatremia: Secondary | ICD-10-CM | POA: Diagnosis present

## 2013-11-23 DIAGNOSIS — Z880 Allergy status to penicillin: Secondary | ICD-10-CM

## 2013-11-23 DIAGNOSIS — E1011 Type 1 diabetes mellitus with ketoacidosis with coma: Secondary | ICD-10-CM

## 2013-11-23 DIAGNOSIS — J9621 Acute and chronic respiratory failure with hypoxia: Secondary | ICD-10-CM

## 2013-11-23 DIAGNOSIS — D631 Anemia in chronic kidney disease: Secondary | ICD-10-CM | POA: Diagnosis present

## 2013-11-23 DIAGNOSIS — I120 Hypertensive chronic kidney disease with stage 5 chronic kidney disease or end stage renal disease: Secondary | ICD-10-CM | POA: Diagnosis present

## 2013-11-23 DIAGNOSIS — J438 Other emphysema: Secondary | ICD-10-CM | POA: Diagnosis present

## 2013-11-23 DIAGNOSIS — R6521 Severe sepsis with septic shock: Secondary | ICD-10-CM

## 2013-11-23 DIAGNOSIS — F3289 Other specified depressive episodes: Secondary | ICD-10-CM | POA: Diagnosis present

## 2013-11-23 DIAGNOSIS — E0911 Drug or chemical induced diabetes mellitus with ketoacidosis with coma: Secondary | ICD-10-CM

## 2013-11-23 DIAGNOSIS — G2581 Restless legs syndrome: Secondary | ICD-10-CM | POA: Diagnosis present

## 2013-11-23 DIAGNOSIS — E0811 Diabetes mellitus due to underlying condition with ketoacidosis with coma: Secondary | ICD-10-CM

## 2013-11-23 DIAGNOSIS — Z23 Encounter for immunization: Secondary | ICD-10-CM

## 2013-11-23 DIAGNOSIS — M899 Disorder of bone, unspecified: Secondary | ICD-10-CM | POA: Diagnosis present

## 2013-11-23 DIAGNOSIS — R9431 Abnormal electrocardiogram [ECG] [EKG]: Secondary | ICD-10-CM

## 2013-11-23 DIAGNOSIS — R739 Hyperglycemia, unspecified: Secondary | ICD-10-CM

## 2013-11-23 DIAGNOSIS — Z888 Allergy status to other drugs, medicaments and biological substances status: Secondary | ICD-10-CM

## 2013-11-23 DIAGNOSIS — M949 Disorder of cartilage, unspecified: Secondary | ICD-10-CM

## 2013-11-23 DIAGNOSIS — E785 Hyperlipidemia, unspecified: Secondary | ICD-10-CM | POA: Diagnosis present

## 2013-11-23 DIAGNOSIS — E875 Hyperkalemia: Secondary | ICD-10-CM

## 2013-11-23 DIAGNOSIS — E1069 Type 1 diabetes mellitus with other specified complication: Secondary | ICD-10-CM

## 2013-11-23 DIAGNOSIS — E1022 Type 1 diabetes mellitus with diabetic chronic kidney disease: Secondary | ICD-10-CM

## 2013-11-23 DIAGNOSIS — Z9483 Pancreas transplant status: Secondary | ICD-10-CM

## 2013-11-23 DIAGNOSIS — Z992 Dependence on renal dialysis: Secondary | ICD-10-CM

## 2013-11-23 DIAGNOSIS — F32A Depression, unspecified: Secondary | ICD-10-CM

## 2013-11-23 DIAGNOSIS — J9601 Acute respiratory failure with hypoxia: Secondary | ICD-10-CM

## 2013-11-23 DIAGNOSIS — G9341 Metabolic encephalopathy: Secondary | ICD-10-CM | POA: Diagnosis not present

## 2013-11-23 DIAGNOSIS — E1369 Other specified diabetes mellitus with other specified complication: Secondary | ICD-10-CM

## 2013-11-23 DIAGNOSIS — K529 Noninfective gastroenteritis and colitis, unspecified: Secondary | ICD-10-CM

## 2013-11-23 DIAGNOSIS — R600 Localized edema: Secondary | ICD-10-CM

## 2013-11-23 DIAGNOSIS — N039 Chronic nephritic syndrome with unspecified morphologic changes: Secondary | ICD-10-CM

## 2013-11-23 DIAGNOSIS — N2581 Secondary hyperparathyroidism of renal origin: Secondary | ICD-10-CM | POA: Diagnosis present

## 2013-11-23 DIAGNOSIS — E1039 Type 1 diabetes mellitus with other diabetic ophthalmic complication: Secondary | ICD-10-CM | POA: Diagnosis present

## 2013-11-23 DIAGNOSIS — N185 Chronic kidney disease, stage 5: Secondary | ICD-10-CM

## 2013-11-23 DIAGNOSIS — Z794 Long term (current) use of insulin: Secondary | ICD-10-CM

## 2013-11-23 DIAGNOSIS — E162 Hypoglycemia, unspecified: Secondary | ICD-10-CM

## 2013-11-23 DIAGNOSIS — E860 Dehydration: Secondary | ICD-10-CM

## 2013-11-23 DIAGNOSIS — J189 Pneumonia, unspecified organism: Secondary | ICD-10-CM | POA: Diagnosis present

## 2013-11-23 DIAGNOSIS — D696 Thrombocytopenia, unspecified: Secondary | ICD-10-CM | POA: Diagnosis not present

## 2013-11-23 DIAGNOSIS — A419 Sepsis, unspecified organism: Principal | ICD-10-CM

## 2013-11-23 DIAGNOSIS — N186 End stage renal disease: Secondary | ICD-10-CM

## 2013-11-23 DIAGNOSIS — Z79899 Other long term (current) drug therapy: Secondary | ICD-10-CM

## 2013-11-23 DIAGNOSIS — E669 Obesity, unspecified: Secondary | ICD-10-CM

## 2013-11-23 DIAGNOSIS — L299 Pruritus, unspecified: Secondary | ICD-10-CM | POA: Diagnosis not present

## 2013-11-23 DIAGNOSIS — E1065 Type 1 diabetes mellitus with hyperglycemia: Secondary | ICD-10-CM | POA: Diagnosis present

## 2013-11-23 DIAGNOSIS — Z940 Kidney transplant status: Secondary | ICD-10-CM

## 2013-11-23 DIAGNOSIS — E1010 Type 1 diabetes mellitus with ketoacidosis without coma: Secondary | ICD-10-CM | POA: Diagnosis present

## 2013-11-23 DIAGNOSIS — G43909 Migraine, unspecified, not intractable, without status migrainosus: Secondary | ICD-10-CM | POA: Diagnosis present

## 2013-11-23 DIAGNOSIS — M7989 Other specified soft tissue disorders: Secondary | ICD-10-CM | POA: Diagnosis not present

## 2013-11-23 DIAGNOSIS — K3184 Gastroparesis: Secondary | ICD-10-CM

## 2013-11-23 DIAGNOSIS — A4902 Methicillin resistant Staphylococcus aureus infection, unspecified site: Secondary | ICD-10-CM | POA: Diagnosis present

## 2013-11-23 DIAGNOSIS — E1021 Type 1 diabetes mellitus with diabetic nephropathy: Secondary | ICD-10-CM

## 2013-11-23 DIAGNOSIS — Z0000 Encounter for general adult medical examination without abnormal findings: Secondary | ICD-10-CM

## 2013-11-23 DIAGNOSIS — R4182 Altered mental status, unspecified: Secondary | ICD-10-CM | POA: Diagnosis present

## 2013-11-23 DIAGNOSIS — J9600 Acute respiratory failure, unspecified whether with hypoxia or hypercapnia: Secondary | ICD-10-CM | POA: Diagnosis present

## 2013-11-23 HISTORY — DX: Kidney transplant status: Z94.0

## 2013-11-23 HISTORY — DX: Pancreas transplant status: Z94.83

## 2013-11-23 LAB — I-STAT ARTERIAL BLOOD GAS, ED
Acid-base deficit: 23 mmol/L — ABNORMAL HIGH (ref 0.0–2.0)
Bicarbonate: 8.2 mEq/L — ABNORMAL LOW (ref 20.0–24.0)
O2 Saturation: 100 %
PCO2 ART: 33.7 mmHg — AB (ref 35.0–45.0)
PH ART: 6.992 — AB (ref 7.350–7.450)
TCO2: 9 mmol/L (ref 0–100)
pO2, Arterial: 341 mmHg — ABNORMAL HIGH (ref 80.0–100.0)

## 2013-11-23 LAB — VITAMIN B12: Vitamin B-12: 1303 pg/mL — ABNORMAL HIGH (ref 211–911)

## 2013-11-23 LAB — GLUCOSE, CAPILLARY
GLUCOSE-CAPILLARY: 582 mg/dL — AB (ref 70–99)
GLUCOSE-CAPILLARY: 466 mg/dL — AB (ref 70–99)
Glucose-Capillary: >600 mg/dL (ref 70–99)
Glucose-Capillary: >600 mg/dL (ref 70–99)
Glucose-Capillary: >600 mg/dL (ref 70–99)
Glucose-Capillary: >600 mg/dL (ref 70–99)
Glucose-Capillary: >600 mg/dL (ref 70–99)
Glucose-Capillary: >600 mg/dL (ref 70–99)
Glucose-Capillary: >600 mg/dL (ref 70–99)
Glucose-Capillary: 347 mg/dL — ABNORMAL HIGH (ref 70–99)

## 2013-11-23 LAB — BASIC METABOLIC PANEL
BUN: 49 mg/dL — ABNORMAL HIGH (ref 6–23)
BUN: 49 mg/dL — AB (ref 6–23)
BUN: 50 mg/dL — ABNORMAL HIGH (ref 6–23)
BUN: 50 mg/dL — ABNORMAL HIGH (ref 6–23)
BUN: 49 mg/dL — AB (ref 6–23)
CHLORIDE: 76 meq/L — AB (ref 96–112)
CHLORIDE: 75 meq/L — AB (ref 96–112)
CHLORIDE: 76 meq/L — AB (ref 96–112)
CHLORIDE: 86 meq/L — AB (ref 96–112)
CO2: 11 mEq/L — ABNORMAL LOW (ref 19–32)
CO2: 11 mEq/L — ABNORMAL LOW (ref 19–32)
CO2: 11 mEq/L — ABNORMAL LOW (ref 19–32)
CO2: 17 meq/L — AB (ref 19–32)
CO2: 22 mEq/L (ref 19–32)
CREATININE: 8.18 mg/dL — AB (ref 0.50–1.10)
CREATININE: 8.44 mg/dL — AB (ref 0.50–1.10)
CREATININE: 8.62 mg/dL — AB (ref 0.50–1.10)
Calcium: 7.2 mg/dL — ABNORMAL LOW (ref 8.4–10.5)
Calcium: 7.2 mg/dL — ABNORMAL LOW (ref 8.4–10.5)
Calcium: 7.3 mg/dL — ABNORMAL LOW (ref 8.4–10.5)
Calcium: 7.1 mg/dL — ABNORMAL LOW (ref 8.4–10.5)
Calcium: 7.1 mg/dL — ABNORMAL LOW (ref 8.4–10.5)
Chloride: 90 mEq/L — ABNORMAL LOW (ref 96–112)
Creatinine, Ser: 8.22 mg/dL — ABNORMAL HIGH (ref 0.50–1.10)
Creatinine, Ser: 8.49 mg/dL — ABNORMAL HIGH (ref 0.50–1.10)
GFR calc Af Amer: 6 mL/min — ABNORMAL LOW (ref >90)
GFR calc Af Amer: 6 mL/min — ABNORMAL LOW (ref >90)
GFR calc Af Amer: 6 mL/min — ABNORMAL LOW (ref >90)
GFR calc non Af Amer: 5 mL/min — ABNORMAL LOW (ref >90)
GFR calc non Af Amer: 5 mL/min — ABNORMAL LOW (ref >90)
GFR calc non Af Amer: 5 mL/min — ABNORMAL LOW (ref >90)
GFR calc non Af Amer: 5 mL/min — ABNORMAL LOW (ref >90)
GFR, EST AFRICAN AMERICAN: 6 mL/min — AB (ref >90)
GFR, EST AFRICAN AMERICAN: 6 mL/min — AB (ref >90)
GFR, EST NON AFRICAN AMERICAN: 5 mL/min — AB (ref >90)
GLUCOSE: 1107 mg/dL — AB (ref 70–99)
Glucose, Bld: 1107 mg/dL (ref 70–99)
Glucose, Bld: 1031 mg/dL (ref 70–99)
Glucose, Bld: 551 mg/dL (ref 70–99)
Glucose, Bld: 97 mg/dL (ref 70–99)
POTASSIUM: 4.5 meq/L (ref 3.7–5.3)
POTASSIUM: 3.2 meq/L — AB (ref 3.7–5.3)
Potassium: 4.5 mEq/L (ref 3.7–5.3)
Potassium: 3.5 mEq/L — ABNORMAL LOW (ref 3.7–5.3)
Potassium: 2.9 mEq/L — CL (ref 3.7–5.3)
SODIUM: 128 meq/L — AB (ref 137–147)
Sodium: 129 mEq/L — ABNORMAL LOW (ref 137–147)
Sodium: 128 mEq/L — ABNORMAL LOW (ref 137–147)
Sodium: 134 mEq/L — ABNORMAL LOW (ref 137–147)
Sodium: 135 mEq/L — ABNORMAL LOW (ref 137–147)

## 2013-11-23 LAB — BLOOD GAS, ARTERIAL
Acid-base deficit: 17.9 mmol/L — ABNORMAL HIGH (ref 0.0–2.0)
Bicarbonate: 9.8 mEq/L — ABNORMAL LOW (ref 20.0–24.0)
Drawn by: 35849
FIO2: 0.4 %
Mode: POSITIVE
O2 Saturation: 99.6 %
PATIENT TEMPERATURE: 97.6
PEEP: 5 cmH2O
PH ART: 7.109 — AB (ref 7.350–7.450)
PRESSURE SUPPORT: 5 cmH2O
TCO2: 10.8 mmol/L (ref 0–100)
pCO2 arterial: 32 mmHg — ABNORMAL LOW (ref 35.0–45.0)
pO2, Arterial: 153 mmHg — ABNORMAL HIGH (ref 80.0–100.0)

## 2013-11-23 LAB — POCT I-STAT 3, ART BLOOD GAS (G3+)
ACID-BASE DEFICIT: 23 mmol/L — AB (ref 0.0–2.0)
Acid-base deficit: 19 mmol/L — ABNORMAL HIGH (ref 0.0–2.0)
BICARBONATE: 7.6 meq/L — AB (ref 20.0–24.0)
BICARBONATE: 10.7 meq/L — AB (ref 20.0–24.0)
O2 Saturation: 98 %
O2 Saturation: 77 %
PCO2 ART: 35.7 mmHg (ref 35.0–45.0)
PH ART: 7.082 — AB (ref 7.350–7.450)
TCO2: 9 mmol/L (ref 0–100)
TCO2: 12 mmol/L (ref 0–100)
pCO2 arterial: 30 mmHg — ABNORMAL LOW (ref 35.0–45.0)
pH, Arterial: 7.007 — CL (ref 7.350–7.450)
pO2, Arterial: 145 mmHg — ABNORMAL HIGH (ref 80.0–100.0)
pO2, Arterial: 55 mmHg — ABNORMAL LOW (ref 80.0–100.0)

## 2013-11-23 LAB — I-STAT CHEM 8, ED
BUN: 44 mg/dL — AB (ref 6–23)
CREATININE: 8.9 mg/dL — AB (ref 0.50–1.10)
Calcium, Ion: 0.88 mmol/L — ABNORMAL LOW (ref 1.12–1.23)
Chloride: 90 mEq/L — ABNORMAL LOW (ref 96–112)
Glucose, Bld: >700 mg/dL (ref 70–99)
HCT: 40 % (ref 36.0–46.0)
HEMOGLOBIN: 13.6 g/dL (ref 12.0–15.0)
POTASSIUM: 7.3 meq/L — AB (ref 3.7–5.3)
SODIUM: 113 meq/L — AB (ref 137–147)
TCO2: 7 mmol/L (ref 0–100)

## 2013-11-23 LAB — CBC WITH DIFFERENTIAL/PLATELET
Basophils Absolute: 0 10*3/uL (ref 0.0–0.1)
Basophils Relative: 0 % (ref 0–1)
EOS ABS: 0 10*3/uL (ref 0.0–0.7)
Eosinophils Relative: 0 % (ref 0–5)
HCT: 38.7 % (ref 36.0–46.0)
Hemoglobin: 11.1 g/dL — ABNORMAL LOW (ref 12.0–15.0)
LYMPHS ABS: 1.3 10*3/uL (ref 0.7–4.0)
Lymphocytes Relative: 10 % — ABNORMAL LOW (ref 12–46)
MCH: 32.1 pg (ref 26.0–34.0)
MCHC: 28.7 g/dL — ABNORMAL LOW (ref 30.0–36.0)
MCV: 111.8 fL — AB (ref 78.0–100.0)
MONO ABS: 0.8 10*3/uL (ref 0.1–1.0)
Monocytes Relative: 6 % (ref 3–12)
Neutro Abs: 11 10*3/uL — ABNORMAL HIGH (ref 1.7–7.7)
Neutrophils Relative %: 84 % — ABNORMAL HIGH (ref 43–77)
Platelets: 249 10*3/uL (ref 150–400)
RBC: 3.46 MIL/uL — ABNORMAL LOW (ref 3.87–5.11)
RDW: 15.8 % — ABNORMAL HIGH (ref 11.5–15.5)
WBC: 13.1 10*3/uL — ABNORMAL HIGH (ref 4.0–10.5)

## 2013-11-23 LAB — COMPREHENSIVE METABOLIC PANEL
ALBUMIN: 3.5 g/dL (ref 3.5–5.2)
ALBUMIN: 2.7 g/dL — AB (ref 3.5–5.2)
ALK PHOS: 210 U/L — AB (ref 39–117)
ALK PHOS: 176 U/L — AB (ref 39–117)
ALT: 23 U/L (ref 0–35)
ALT: 43 U/L — ABNORMAL HIGH (ref 0–35)
AST: 71 U/L — ABNORMAL HIGH (ref 0–37)
AST: 130 U/L — AB (ref 0–37)
BILIRUBIN TOTAL: 0.3 mg/dL (ref 0.3–1.2)
BUN: 50 mg/dL — ABNORMAL HIGH (ref 6–23)
BUN: 50 mg/dL — ABNORMAL HIGH (ref 6–23)
CALCIUM: 7.1 mg/dL — AB (ref 8.4–10.5)
CHLORIDE: 76 meq/L — AB (ref 96–112)
CO2: <7 mEq/L — CL (ref 19–32)
CO2: 11 meq/L — AB (ref 19–32)
Calcium: 7.1 mg/dL — ABNORMAL LOW (ref 8.4–10.5)
Chloride: <65 mEq/L — CL (ref 96–112)
Creatinine, Ser: 8.18 mg/dL — ABNORMAL HIGH (ref 0.50–1.10)
Creatinine, Ser: 8.25 mg/dL — ABNORMAL HIGH (ref 0.50–1.10)
GFR calc Af Amer: 6 mL/min — ABNORMAL LOW (ref >90)
GFR calc Af Amer: 6 mL/min — ABNORMAL LOW (ref >90)
GFR calc non Af Amer: 5 mL/min — ABNORMAL LOW (ref >90)
GFR calc non Af Amer: 5 mL/min — ABNORMAL LOW (ref >90)
GLUCOSE: 1441 mg/dL — AB (ref 70–99)
Glucose, Bld: 1107 mg/dL (ref 70–99)
Potassium: 4.5 mEq/L (ref 3.7–5.3)
SODIUM: 109 meq/L — AB (ref 137–147)
Sodium: 128 mEq/L — ABNORMAL LOW (ref 137–147)
Total Bilirubin: 0.5 mg/dL (ref 0.3–1.2)
Total Protein: 7.9 g/dL (ref 6.0–8.3)
Total Protein: 6.3 g/dL (ref 6.0–8.3)

## 2013-11-23 LAB — RENAL FUNCTION PANEL
Albumin: 2.8 g/dL — ABNORMAL LOW (ref 3.5–5.2)
BUN: 50 mg/dL — ABNORMAL HIGH (ref 6–23)
CHLORIDE: 75 meq/L — AB (ref 96–112)
CO2: 10 mEq/L — CL (ref 19–32)
Calcium: 7.2 mg/dL — ABNORMAL LOW (ref 8.4–10.5)
Creatinine, Ser: 8.36 mg/dL — ABNORMAL HIGH (ref 0.50–1.10)
GFR, EST AFRICAN AMERICAN: 6 mL/min — AB (ref >90)
GFR, EST NON AFRICAN AMERICAN: 5 mL/min — AB (ref >90)
GLUCOSE: 1105 mg/dL — AB (ref 70–99)
Phosphorus: 11.6 mg/dL — ABNORMAL HIGH (ref 2.3–4.6)
Potassium: 4 mEq/L (ref 3.7–5.3)
Sodium: 127 mEq/L — ABNORMAL LOW (ref 137–147)

## 2013-11-23 LAB — CBG MONITORING, ED: Glucose-Capillary: >600 mg/dL (ref 70–99)

## 2013-11-23 LAB — CBC
HEMATOCRIT: 31.3 % — AB (ref 36.0–46.0)
Hemoglobin: 9.8 g/dL — ABNORMAL LOW (ref 12.0–15.0)
MCH: 32.5 pg (ref 26.0–34.0)
MCHC: 31.3 g/dL (ref 30.0–36.0)
MCV: 103.6 fL — AB (ref 78.0–100.0)
Platelets: 191 10*3/uL (ref 150–400)
RBC: 3.02 MIL/uL — ABNORMAL LOW (ref 3.87–5.11)
RDW: 15.1 % (ref 11.5–15.5)
WBC: 12.7 10*3/uL — ABNORMAL HIGH (ref 4.0–10.5)

## 2013-11-23 LAB — PHOSPHORUS: Phosphorus: 11.9 mg/dL — ABNORMAL HIGH (ref 2.3–4.6)

## 2013-11-23 LAB — MAGNESIUM: MAGNESIUM: 2.4 mg/dL (ref 1.5–2.5)

## 2013-11-23 LAB — LACTIC ACID, PLASMA
Lactic Acid, Venous: 10 mmol/L — ABNORMAL HIGH (ref 0.5–2.2)
Lactic Acid, Venous: 11.2 mmol/L — ABNORMAL HIGH (ref 0.5–2.2)

## 2013-11-23 LAB — AMYLASE: Amylase: 273 U/L — ABNORMAL HIGH (ref 0–105)

## 2013-11-23 LAB — I-STAT CG4 LACTIC ACID, ED: Lactic Acid, Venous: 7.69 mmol/L — ABNORMAL HIGH (ref 0.5–2.2)

## 2013-11-23 LAB — KETONES, QUALITATIVE

## 2013-11-23 LAB — LIPASE, BLOOD: LIPASE: 34 U/L (ref 11–59)

## 2013-11-23 LAB — LACTATE DEHYDROGENASE: LDH: 205 U/L (ref 94–250)

## 2013-11-23 LAB — MRSA PCR SCREENING: MRSA by PCR: POSITIVE — AB

## 2013-11-23 MED ORDER — SODIUM CHLORIDE 0.9 % IV BOLUS (SEPSIS)
1000.0000 mL | Freq: Once | INTRAVENOUS | Status: AC
  Administered 2013-11-23: 1000 mL via INTRAVENOUS

## 2013-11-23 MED ORDER — SUCCINYLCHOLINE CHLORIDE 20 MG/ML IJ SOLN
INTRAMUSCULAR | Status: AC
  Filled 2013-11-23: qty 1

## 2013-11-23 MED ORDER — SODIUM BICARBONATE 8.4 % IV SOLN: 50.0000 meq | Freq: Once | INTRAVENOUS | Status: DC

## 2013-11-23 MED ORDER — ROCURONIUM BROMIDE 50 MG/5ML IV SOLN
INTRAVENOUS | Status: AC
  Filled 2013-11-23: qty 2

## 2013-11-23 MED ORDER — CALCIUM GLUCONATE 10 % IV SOLN
INTRAVENOUS | Status: AC
  Administered 2013-11-23: 1000 mg
  Filled 2013-11-23: qty 10

## 2013-11-23 MED ORDER — STERILE WATER FOR INJECTION IV SOLN
INTRAVENOUS | Status: DC
  Administered 2013-11-23: 07:00:00 via INTRAVENOUS
  Filled 2013-11-23 (×2): qty 850

## 2013-11-23 MED ORDER — ASPIRIN 81 MG PO CHEW
324.0000 mg | CHEWABLE_TABLET | ORAL | Status: AC
  Administered 2013-11-23: 324 mg via ORAL
  Filled 2013-11-23: qty 4

## 2013-11-23 MED ORDER — SODIUM BICARBONATE 8.4 % IV SOLN
50.0000 meq | Freq: Once | INTRAVENOUS | Status: AC
  Administered 2013-11-23: 50 meq via INTRAVENOUS

## 2013-11-23 MED ORDER — SODIUM CHLORIDE 0.9 % IV SOLN
0.0000 ug/h | INTRAVENOUS | Status: DC
  Administered 2013-11-23: 100 ug/h via INTRAVENOUS
  Filled 2013-11-23 (×2): qty 50

## 2013-11-23 MED ORDER — ETOMIDATE 2 MG/ML IV SOLN
0.3000 mg/kg | Freq: Once | INTRAVENOUS | Status: AC
  Administered 2013-11-23: 20 mg via INTRAVENOUS

## 2013-11-23 MED ORDER — DEXTROSE 50 % IV SOLN
25.0000 mL | INTRAVENOUS | Status: DC | PRN
  Administered 2013-11-26 – 2013-11-28 (×3): 25 mL via INTRAVENOUS
  Filled 2013-11-23 (×4): qty 50

## 2013-11-23 MED ORDER — LIDOCAINE HCL (CARDIAC) 20 MG/ML IV SOLN
INTRAVENOUS | Status: AC
  Filled 2013-11-23: qty 5

## 2013-11-23 MED ORDER — DEXTROSE 5 % IV SOLN
1.0000 g | Freq: Once | INTRAVENOUS | Status: AC
  Administered 2013-11-23: 1 g via INTRAVENOUS
  Filled 2013-11-23: qty 1

## 2013-11-23 MED ORDER — DEXTROSE-NACL 5-0.45 % IV SOLN
INTRAVENOUS | Status: DC
  Administered 2013-11-24 – 2013-11-25 (×2): via INTRAVENOUS

## 2013-11-23 MED ORDER — INSULIN REGULAR HUMAN 100 UNIT/ML IJ SOLN
INTRAMUSCULAR | Status: DC
  Administered 2013-11-23: 5.4 [IU]/h via INTRAVENOUS
  Administered 2013-11-23: 27.1 [IU]/h via INTRAVENOUS
  Administered 2013-11-25: 1.3 [IU]/h via INTRAVENOUS
  Filled 2013-11-23 (×5): qty 1

## 2013-11-23 MED ORDER — CHLORHEXIDINE GLUCONATE 0.12 % MT SOLN
15.0000 mL | Freq: Two times a day (BID) | OROMUCOSAL | Status: DC
  Administered 2013-11-23 – 2013-11-29 (×11): 15 mL via OROMUCOSAL
  Filled 2013-11-23 (×19): qty 15

## 2013-11-23 MED ORDER — INSULIN ASPART 100 UNIT/ML ~~LOC~~ SOLN: 10.0000 [IU] | Freq: Once | SUBCUTANEOUS | Status: DC

## 2013-11-23 MED ORDER — SODIUM BICARBONATE 8.4 % IV SOLN
100.0000 meq | Freq: Once | INTRAVENOUS | Status: AC
  Administered 2013-11-23: 100 meq via INTRAVENOUS
  Filled 2013-11-23: qty 50

## 2013-11-23 MED ORDER — FENTANYL CITRATE 0.05 MG/ML IJ SOLN: 50.0000 ug | Freq: Once | INTRAMUSCULAR | Status: DC

## 2013-11-23 MED ORDER — PANTOPRAZOLE SODIUM 40 MG IV SOLR
40.0000 mg | Freq: Every day | INTRAVENOUS | Status: DC
  Administered 2013-11-23 – 2013-11-25 (×3): 40 mg via INTRAVENOUS
  Filled 2013-11-23 (×5): qty 40

## 2013-11-23 MED ORDER — ETOMIDATE 2 MG/ML IV SOLN
INTRAVENOUS | Status: AC
  Filled 2013-11-23: qty 20

## 2013-11-23 MED ORDER — PHENYLEPHRINE HCL 10 MG/ML IJ SOLN
30.0000 ug/min | INTRAMUSCULAR | Status: DC
  Administered 2013-11-23: 60 ug/min via INTRAVENOUS
  Filled 2013-11-23: qty 4

## 2013-11-23 MED ORDER — SODIUM CHLORIDE 0.9 % IV SOLN
2.0000 mg/h | INTRAVENOUS | Status: DC
  Administered 2013-11-23: 2 mg/h via INTRAVENOUS
  Filled 2013-11-23 (×2): qty 10

## 2013-11-23 MED ORDER — FENTANYL BOLUS VIA INFUSION
50.0000 ug | INTRAVENOUS | Status: DC | PRN
  Filled 2013-11-23: qty 100

## 2013-11-23 MED ORDER — SODIUM CHLORIDE 0.9 % IV SOLN
INTRAVENOUS | Status: DC
  Administered 2013-11-23: 08:00:00 via INTRAVENOUS

## 2013-11-23 MED ORDER — POTASSIUM CHLORIDE 10 MEQ/50ML IV SOLN
10.0000 meq | INTRAVENOUS | Status: AC
  Administered 2013-11-23 (×3): 10 meq via INTRAVENOUS
  Filled 2013-11-23 (×3): qty 50

## 2013-11-23 MED ORDER — ROCURONIUM BROMIDE 50 MG/5ML IV SOLN
1.0000 mg/kg | Freq: Once | INTRAVENOUS | Status: AC
  Administered 2013-11-23: 70 mg via INTRAVENOUS

## 2013-11-23 MED ORDER — CHLORHEXIDINE GLUCONATE CLOTH 2 % EX PADS
6.0000 | MEDICATED_PAD | Freq: Every day | CUTANEOUS | Status: AC
  Administered 2013-11-23 – 2013-11-26 (×4): 6 via TOPICAL

## 2013-11-23 MED ORDER — FENTANYL CITRATE 0.05 MG/ML IJ SOLN: 100.0000 ug | INTRAMUSCULAR | Status: DC | PRN

## 2013-11-23 MED ORDER — ASPIRIN 300 MG RE SUPP: 300.0000 mg | RECTAL | Status: AC

## 2013-11-23 MED ORDER — ALBUTEROL (5 MG/ML) CONTINUOUS INHALATION SOLN
10.0000 mg/h | INHALATION_SOLUTION | Freq: Once | RESPIRATORY_TRACT | Status: AC
  Administered 2013-11-23: 10 mg/h via RESPIRATORY_TRACT
  Filled 2013-11-23: qty 20

## 2013-11-23 MED ORDER — HEPARIN SODIUM (PORCINE) 5000 UNIT/ML IJ SOLN
5000.0000 [IU] | Freq: Three times a day (TID) | INTRAMUSCULAR | Status: DC
  Administered 2013-11-23 – 2013-11-26 (×9): 5000 [IU] via SUBCUTANEOUS
  Filled 2013-11-23 (×29): qty 1

## 2013-11-23 MED ORDER — AZTREONAM 1 G IJ SOLR
1.0000 g | INTRAMUSCULAR | Status: DC
  Administered 2013-11-23 – 2013-11-26 (×4): 1 g via INTRAVENOUS
  Filled 2013-11-23 (×6): qty 1

## 2013-11-23 MED ORDER — MIDAZOLAM HCL 2 MG/2ML IJ SOLN: 2.0000 mg | INTRAMUSCULAR | Status: DC | PRN

## 2013-11-23 MED ORDER — INSULIN ASPART 100 UNIT/ML ~~LOC~~ SOLN
10.0000 [IU] | Freq: Once | SUBCUTANEOUS | Status: AC
  Administered 2013-11-23: 10 [IU] via INTRAVENOUS

## 2013-11-23 MED ORDER — SODIUM CHLORIDE 0.9 % IV SOLN
INTRAVENOUS | Status: DC
  Administered 2013-11-23: 04:00:00 via INTRAVENOUS

## 2013-11-23 MED ORDER — BIOTENE DRY MOUTH MT LIQD
15.0000 mL | Freq: Four times a day (QID) | OROMUCOSAL | Status: DC
  Administered 2013-11-23 – 2013-11-28 (×14): 15 mL via OROMUCOSAL

## 2013-11-23 MED ORDER — MUPIROCIN 2 % EX OINT
1.0000 "application " | TOPICAL_OINTMENT | Freq: Two times a day (BID) | CUTANEOUS | Status: AC
  Administered 2013-11-23 – 2013-11-27 (×8): 1 via NASAL
  Filled 2013-11-23 (×3): qty 22

## 2013-11-23 MED ORDER — SODIUM CHLORIDE 0.9 % IV SOLN: 250.0000 mL | INTRAVENOUS | Status: DC | PRN

## 2013-11-23 MED ORDER — VANCOMYCIN HCL 10 G IV SOLR
1500.0000 mg | Freq: Once | INTRAVENOUS | Status: AC
  Administered 2013-11-23: 1500 mg via INTRAVENOUS
  Filled 2013-11-23: qty 1500

## 2013-11-23 MED ORDER — CALCIUM CHLORIDE 10 % IV SOLN
1.0000 g | Freq: Once | INTRAVENOUS | Status: AC
  Administered 2013-11-23: 1 g via INTRAVENOUS

## 2013-11-23 MED FILL — Medication: Qty: 1 | Status: AC

## 2013-11-23 NOTE — ED Notes (Signed)
Pt. reports nausea and vomitting with headache and generalized weakness onset today , hemodialysis q Tues/Thu/Sat . Denies fever or chills.

## 2013-11-23 NOTE — ED Notes (Signed)
SHOWN TO DR.ZAVITZ LAB RESAULTS ON CG4 7.69

## 2013-11-23 NOTE — ED Notes (Signed)
Dr. Reather Converse at bedside and critical lab results given to him as lab gave them to RN.

## 2013-11-23 NOTE — Progress Notes (Signed)
BS down some , still over 1000.  K+ much better at 3.5, still pretty acidemic.  Would hold off on HD for now , will reassess later today.    Kelly Splinter MD (pgr) [phone]    ([phone] 11/23/2013, 1:13 PM

## 2013-11-23 NOTE — Consult Note (Signed)
Reason for Consult:ESRD, Hyperkalemia Referring Physician: ER  [name] is an 43 y.o. female.  HPI: 43 yr female with hx Type 1 DM, ESRD, Obesity, nonadherence, GERD,. ^ Lipids, Depression, DM retinopathy, and colitis.  Now with weakness and fatigue and decreased LOC.  Found to have severe acidemia, ^ K >7.7, Glu of 1440, and SNa of 108.  Had wide complex rhythm and peaked T waves.  Did dialyisis yest full tx but BS 595 at HD.  Hx failed KP tx.   Review of systems not obtained due to patient factors.   Dialyzes at [facility] on TTS since 2010. Primary Nephrologist Lorrene Reid.. , Dialyzer 180 NR, Heparin Strd. Access RLE AVG.  Past Medical History  Diagnosis Date  . Hyperlipidemia   . Alopecia   . Obesity   . Iron deficiency   . 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 RLS (restless legs syndrome)   . Injury of conjunctiva and corneal abrasion of right eye without foreign body   . Cellulitis   . Immunosuppression 08/01/11    Dr Joelyn Oms spoke with [facility] transplant regarding immunosuppression (Apr 08, 2013). 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 [facility] stopped in November 2014.  After one month plan is to taper off tacrolimus (decrease from bid to qday for 1 week then reduce dose by 50% for one week then stop). Prednisone should then be the last to come   . Anemia   . Blood transfusion 2004    "when I had my transplant"  . Migraine   . Hyperparathyroidism, secondary   . Diabetic retinopathy   . Hypertension     takes Metoprolol and Exforge daily  . Depression     takes Klonopin nightly  . Diabetes mellitus type 1     Pt states diagnosed at age 14 with prior episodes of DKA. S/p pancreatic transplant   . GERD (gastroesophageal reflux disease)     takes Protonix daily  . Bronchitis   . Migraine   . Asthma   . Emphysema  of lung   . Angina     none in past 2 years.  . Stomach pain     Hx: of chronic  . Pneumonia 2012    "double"  . Dialysis patient     tues,thurs,sat    Past Surgical History  Procedure Laterality Date  . 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 Thyroglobulin      x 7  . Av fistula placement      right upper arm  . Cholecystectomy  1995  .  hd graft placement/removal      "had 2 in my left upper arm"  . Eye surgery    . Retinopathy surgery    . Tooth extraction  10/10/11    X's two  . Insertion of dialysis catheter  12/08/2011    Procedure: INSERTION OF DIALYSIS CATHETER;  Surgeon: Angelia Mould, MD;  Location: [place];  Service: Vascular;  Laterality: Left;  . Av fistula placement  01/22/2012    Procedure: INSERTION OF ARTERIOVENOUS (AV) GORE-TEX GRAFT ARM;  Surgeon: Angelia Mould, MD;  Location: [place];  Service: Vascular;  Laterality: Left;  Ultrasound guided.  . Av fistula placement  03/14/2012    Procedure: INSERTION OF ARTERIOVENOUS (  AV) GORE-TEX GRAFT THIGH;  Surgeon: Angelia Mould, MD;  Location: [place];  Service: Vascular;  Laterality: Right;  . False aneurysm repair Right 01/02/2013    Procedure: Excision of lymphocele in right thigh.;  Surgeon: Angelia Mould, MD;  Location: [place];  Service: Vascular;  Laterality: Right;  . Carpal tunnel release Right 03/02/2013    Procedure: CARPAL TUNNEL RELEASE;  Surgeon: Tennis Must, MD;  Location: [place];  Service: Orthopedics;  Laterality: Right;  . Flexible sigmoidoscopy N/A 03/24/2013    Procedure: FLEXIBLE SIGMOIDOSCOPY;  Surgeon: Cleotis Nipper, MD;  Location: [facility] ENDOSCOPY;  Service: Endoscopy;  Laterality: N/A;  . Revision of arteriovenous goretex graft Right 04/02/2013    Procedure: REPAIR OF PSEUDOANEURYSM OF ARTERIOVENOUS GORETEX GRAFT;  Surgeon: Angelia Mould, MD;  Location: [place];  Service: Vascular;  Laterality: Right;  . Carpal  tunnel release Left 10/16/2013    Procedure: LEFT CARPAL TUNNEL RELEASE;  Surgeon: Tennis Must, MD;  Location: [place];  Service: Orthopedics;  Laterality: Left;    Family History  Problem Relation Age of Onset  . Hypertension Mother   . Kidney disease Mother   . Diabetes Mother   . Hypertension Father   . Kidney disease Father   . Diabetes Father     Social History:  reports that she has never smoked. She has never used smokeless tobacco. She reports that she does not drink alcohol or use illicit drugs.  Allergies:  Allergies  Allergen Reactions  . Depakote [Divalproex Sodium] Other (See Comments)    Pt gets "delirious"  . Morphine And Related Nausea And Vomiting and Other (See Comments)    "makes me delirious"  . Penicillins Anaphylaxis    Tolerated Zosyn Dec 2014  . Tramadol Nausea And Vomiting  . Imitrex [Sumatriptan] Other (See Comments)    seizures  . Vicodin [Hydrocodone-Acetaminophen] Itching and Rash    Medications: I have reviewed the patient's current medications. Prior to Admission:  (Not in a hospital admission)   Results for orders placed during the hospital encounter of 11/23/13 (from the past 48 hour(s))  CBG MONITORING, ED     Status: Abnormal   Collection Time    11/23/13  1:41 AM      Result Value Ref Range   Glucose-Capillary >600 (*) 70 - 99 mg/dL   Comment 1 Notify RN     Comment 2 Documented in Chart    CBC WITH DIFFERENTIAL     Status: Abnormal   Collection Time    11/23/13  1:50 AM      Result Value Ref Range   WBC 13.1 (*) 4.0 - 10.5 K/uL   RBC 3.46 (*) 3.87 - 5.11 MIL/uL   Hemoglobin 11.1 (*) 12.0 - 15.0 g/dL   HCT 38.7  36.0 - 46.0 %   MCV 111.8 (*) 78.0 - 100.0 fL   MCH 32.1  26.0 - 34.0 pg   MCHC 28.7 (*) 30.0 - 36.0 g/dL   RDW 15.8 (*) 11.5 - 15.5 %   Platelets 249  150 - 400 K/uL   Neutrophils Relative % 84 (*) 43 - 77 %   Lymphocytes Relative 10 (*) 12 - 46 %   Monocytes Relative 6  3 - 12 %   Eosinophils  Relative 0  0 - 5 %   Basophils Relative 0  0 - 1 %   Neutro Abs 11.0 (*) 1.7 - 7.7 K/uL   Lymphs Abs  1.3  0.7 - 4.0 K/uL   Monocytes Absolute 0.8  0.1 - 1.0 K/uL   Eosinophils Absolute 0.0  0.0 - 0.7 K/uL   Basophils Absolute 0.0  0.0 - 0.1 K/uL  COMPREHENSIVE METABOLIC PANEL     Status: Abnormal   Collection Time    11/23/13  1:50 AM      Result Value Ref Range   Sodium 109 (*) 137 - 147 mEq/L   Comment: CRITICAL RESULT CALLED TO, READ BACK BY AND VERIFIED WITH:     K NEWMAN,RN 11/23/13 0238 RHOLMES   Potassium >7.7 (*) 3.7 - 5.3 mEq/L   Comment: CRITICAL RESULT CALLED TO, READ BACK BY AND VERIFIED WITH:     K NEWMAN,RN 11/23/13 0238 RHOLMES   Chloride <65 (*) 96 - 112 mEq/L   Comment: CRITICAL RESULT CALLED TO, READ BACK BY AND VERIFIED WITH:     K NEWMAN,RN 11/23/13 0238 RHOLMES   CO2 <7 (*) 19 - 32 mEq/L   Comment: CRITICAL RESULT CALLED TO, READ BACK BY AND VERIFIED WITH:     K NEWMAN,RN 11/23/13 0238 RHOLMES   Glucose, Bld 1441 (*) 70 - 99 mg/dL   Comment: CRITICAL RESULT CALLED TO, READ BACK BY AND VERIFIED WITH:     K NEWMAN,RN 11/23/13 0238 RHOLMES   BUN 50 (*) 6 - 23 mg/dL   Creatinine, Ser 8.18 (*) 0.50 - 1.10 mg/dL   Calcium 7.1 (*) 8.4 - 10.5 mg/dL   Total Protein 7.9  6.0 - 8.3 g/dL   Albumin 3.5  3.5 - 5.2 g/dL   AST 71 (*) 0 - 37 U/L   ALT 23  0 - 35 U/L   Alkaline Phosphatase 210 (*) 39 - 117 U/L   Total Bilirubin 0.5  0.3 - 1.2 mg/dL   GFR calc non Af Amer 5 (*) >90 mL/min   GFR calc Af Amer 6 (*) >90 mL/min   Comment: (NOTE)     The eGFR has been calculated using the CKD EPI equation.     This calculation has not been validated in all clinical situations.     eGFR's persistently <90 mL/min signify possible Chronic Kidney     Disease.  MAGNESIUM     Status: None   Collection Time    11/23/13  3:25 AM      Result Value Ref Range   Magnesium 2.4  1.5 - 2.5 mg/dL  I-STAT ARTERIAL BLOOD GAS, ED     Status: Abnormal   Collection Time    11/23/13  3:31 AM       Result Value Ref Range   pH, Arterial 6.992 (*) 7.350 - 7.450   pCO2 arterial 33.7 (*) 35.0 - 45.0 mmHg   pO2, Arterial 341.0 (*) 80.0 - 100.0 mmHg   Bicarbonate 8.2 (*) 20.0 - 24.0 mEq/L   TCO2 9  0 - 100 mmol/L   O2 Saturation 100.0     Acid-base deficit 23.0 (*) 0.0 - 2.0 mmol/L   Patient temperature 97.3 F     Collection site RADIAL, ALLEN'S TEST ACCEPTABLE     Drawn by RT     Sample type ARTERIAL     Comment NOTIFIED PHYSICIAN    I-STAT CHEM 8, ED     Status: Abnormal   Collection Time    11/23/13  3:32 AM      Result Value Ref Range   Sodium 113 (*) 137 - 147 mEq/L   Potassium 7.3 (*) 3.7 - 5.3 mEq/L  Chloride 90 (*) 96 - 112 mEq/L   BUN 44 (*) 6 - 23 mg/dL   Creatinine, Ser 8.90 (*) 0.50 - 1.10 mg/dL   Glucose, Bld >700 (*) 70 - 99 mg/dL   Calcium, Ion 0.88 (*) 1.12 - 1.23 mmol/L   TCO2 7  0 - 100 mmol/L   Hemoglobin 13.6  12.0 - 15.0 g/dL   HCT 40.0  36.0 - 46.0 %   Comment NOTIFIED PHYSICIAN    I-STAT CG4 LACTIC ACID, ED     Status: Abnormal   Collection Time    11/23/13  3:34 AM      Result Value Ref Range   Lactic Acid, Venous 7.69 (*) 0.5 - 2.2 mmol/L  CBG MONITORING, ED     Status: Abnormal   Collection Time    11/23/13  4:20 AM      Result Value Ref Range   Glucose-Capillary >600 (*) 70 - 99 mg/dL    Dg Chest Portable 1 View  11/23/2013   CLINICAL DATA:  Repositioned endotracheal tube.  EXAM: PORTABLE CHEST - 1 VIEW  COMPARISON:  Chest radiograph November 23, 2013 at 0309 hr and chest radiograph June 27, 2013  FINDINGS: Retracted endotracheal tube, distal tip at the carina. Re-expanded left lung with patchy airspace opacities. No pleural effusions. No pneumothorax  Cardiac silhouette appears mildly enlarged, mediastinal silhouette is nonsuspicious. Multiple EKG lines overlie the patient and may obscure subtle underlying pathology. Soft tissue planes and included osseous structures are unchanged.  IMPRESSION: Retracted endotracheal tube, distal tip at the  carina, recommend 2 cm of further retraction.  Re-expanded left lung with patchy airspace opacities, nonspecific.  Mild cardiomegaly.   Electronically Signed   By: Elon Alas   On: 11/23/2013 04:14   Dg Chest Portable 1 View  11/23/2013   CLINICAL DATA:  Emesis  EXAM: PORTABLE CHEST - 1 VIEW  COMPARISON:  06/27/2013  FINDINGS: Right mainstem intubation. The tube would need to be retracted 5 cm to reach the mid thoracic trachea. There is complete collapse of the left lung, with leftward shift of the mediastinum. The tube has been retracted at time of dictation. The heart size is obscured. The right lung is well aerated. No pneumothorax.  IMPRESSION: Right mainstem intubation and left lung collapse.   Electronically Signed   By: Jorje Guild M.D.   On: 11/23/2013 04:01   Dg Abd Portable 1v  11/23/2013   CLINICAL DATA:  Central line placement, emesis.  EXAM: PORTABLE ABDOMEN - 1 VIEW  COMPARISON:  None.  FINDINGS: Left femoral line in place, distal tip projecting over the sacrum.  Bowel gas pattern is nondilated and nonobstructive, with overall paucity of bowel gas. A staple line in the in the right lower quadrant, surgical clips in the right upper quadrant, subcentimeter calcifications in the the left abdomen may are present nephrolithiasis. Soft tissue planes and included osseous structures are nonsuspicious.  : Left femoral line distal tip projects at the sacrum.  Nonspecific bowel gas pattern.   Electronically Signed   By: Elon Alas   On: 11/23/2013 04:13    ROS Blood pressure 123/60, pulse 96, temperature 97.3 F (36.3 C), temperature source Oral, resp. rate 35, height 5' 1.02" (1.55 m), weight 68.7 kg (151 lb 7.3 oz), SpO2 100.00%. Physical Exam Physical Examination: General appearance - sedated on vent, nonresponsive Mental status - as above Eyes - funduscopic exam abnormal DM retinopathy, s/p laser, pupils nonresponsive Ears - bilateral TM's and external ear canals  normal Neck  - adenopathy noted PCL Lymphatics - posterior cervical nodes Chest - clear to auscultation, no wheezes, rales or rhonchi, symmetric air entry Heart - normal rate and regular rhythm, S4 present, systolic murmur L2/7 at 2nd left intercostal space Abdomen - soft, no bs, liver down 4 cm Neurological - nonresponsive, DTRs 1+/4+ Extremities - AVG R groin, tattos UE Skin - as above  Assessment/Plan: 1 Hyper Osm presumably DKA, ? Lactate.   Not typ of DKA to have rhythm disturb, but she does.  Will follow K with tx of Glu.  SNa secondary to Osm.  Had started before yest.  ? Other issues 2 ESRD: TTS did HD yest so suspect was ok with K etc.  May need HD if K not lower soon 3 Hypertension: not an issue. Usually on Metoprolol 4. 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  Behavior freq issues P IV insulin, follow rhythm, K cultures, AB  DETERDING,JAMES L 11/23/2013, 4:43 AM

## 2013-11-23 NOTE — ED Notes (Signed)
Shown DR.ZAVITZ PT.CHEM 8 LAB RESAULTS.

## 2013-11-23 NOTE — Progress Notes (Signed)
CBG- 1107 (High Panic)  0800 11/23/13. On insulin drip

## 2013-11-23 NOTE — Progress Notes (Signed)
eLink Physician-Brief Progress Note Patient Name: [name] DOB: 1970/10/06 MRN: [id number]  Date of Service  11/23/2013   HPI/Events of Note  Metabolic acidosis with pH of 7.0/30/145/7   eICU Interventions  Plan: Already on high RR with decrease in pCO2 to 30 2 amps of Bicarb IVP Change IVFs to bicarb based at 75 cc/hr Continue IV insulin Monitor K closely   Intervention Category Major Interventions: Acid-Base disturbance - evaluation and management  DETERDING,ELIZABETH 11/23/2013, 5:58 AM

## 2013-11-23 NOTE — Progress Notes (Signed)
INITIAL NUTRITION ASSESSMENT  DOCUMENTATION CODES Per approved criteria  -Not Applicable   INTERVENTION:  If unable to extubate patient within the next 24 hours and able to feed gut, recommend utilizing 44M PEPuP Protocol: initiate TF via OGT with Nepro at 25 ml/h and Prostat 30 ml once daily on day 1; on day 2, increase to goal rate of 30 ml/h (720 ml per day) to provide 1396 kcals (91% of estimated needs), 73 gm protein (100% of estimated needs), 523 ml free water daily.  NUTRITION DIAGNOSIS: Inadequate oral intake related to inability to eat as evidenced by NPO status.   Goal: Intake to meet >90% of estimated nutrition needs.  Monitor:  TF initiation/tolerance/adequacy, weight trend, labs, vent status.  Reason for Assessment: VDRF  43 y.o. 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 Presents to the ED complaining of weakness and dizziness. Found to be in severe DKA. Also severe hyperkalemia with wide QRS complexes. Intubated for airway protection.  Discussed patient in ICU rounds today. Last HD was 6/27, for HD again 6/30. No plans to start TF today due to insulin drip and potential for abdominal source of infection with lactic acidosis. May need CT abdomen.  Nutrition focused physical exam completed.  No muscle or subcutaneous fat depletion noticed.  Patient is currently intubated on ventilator support MV: 12.7 L/min Temp (24hrs), Avg:97.6 F (36.4 C), Min:97.3 F (36.3 C), Max:97.8 F (36.6 C)   Height: Ht Readings from Last 1 Encounters:  11/23/13 5' 1.02" (1.55 m)    Weight: Wt Readings from Last 1 Encounters:  11/23/13 151 lb 7.3 oz (68.7 kg)    Ideal Body Weight: 47.7 kg  % Ideal Body Weight: 144%  Wt Readings from Last 10 Encounters:  11/23/13 151 lb 7.3 oz (68.7 kg)  10/16/13 151 lb 6 oz (68.663 kg)  10/16/13 151 lb 6 oz (68.663 kg)   06/27/13 150 lb (68.04 kg)  05/09/13 150 lb 8 oz (68.266 kg)  04/11/13 149 lb 0.5 oz (67.6 kg)  04/01/13 145 lb (65.772 kg)  04/01/13 145 lb (65.772 kg)  03/28/13 151 lb 7.3 oz (68.7 kg)  03/28/13 151 lb 7.3 oz (68.7 kg)    Usual Body Weight: 145-151 lb  % Usual Body Weight: 100%  BMI:  Body mass index is 28.6 kg/(m^2).  Estimated Nutritional Needs: Kcal: 3875 Protein: 70-80 gm Fluid: 1.2 L  Skin: intact  Diet Order: NPO  EDUCATION NEEDS: -Education not appropriate at this time   Intake/Output Summary (Last 24 hours) at 11/23/13 1351 Last data filed at 11/23/13 1100  Gross per 24 hour  Intake 2083.13 ml  Output      0 ml  Net 2083.13 ml    Last BM: none documented since admission   Labs:   Recent Labs Lab 11/23/13 0325  11/23/13 0500 11/23/13 0613 11/23/13 0750 11/23/13 0930  NA  --   < > 128* 128* 127* 128*  K  --   < > 4.5 4.5 4.0 3.5*  CL  --   < > 76* 75* 75* 76*  CO2  --   < > 11* 11* 10* 11*  BUN  --   < > 50* 49* 50* 50*  CREATININE  --   < > 8.25* 8.18* 8.36* 8.44*  CALCIUM  --   < > 7.1* 7.2* 7.2* 7.3*  MG 2.4  --   --   --   --   --  PHOS  --   --  11.9*  --  11.6*  --   GLUCOSE  --   < > 1107* 1107* 1105* 1031*  < > = values in this interval not displayed.  CBG (last 3)   Recent Labs  11/23/13 0420 11/23/13 0538 11/23/13 0647  GLUCAP >600* >600* >600*    Scheduled Meds: . antiseptic oral rinse  15 mL Mouth Rinse QID  . aztreonam  1 g Intravenous Q24H  . chlorhexidine  15 mL Mouth Rinse BID  . Chlorhexidine Gluconate Cloth  6 each Topical Q0600  . etomidate      . fentaNYL  50 mcg Intravenous Once  . heparin  5,000 Units Subcutaneous 3 times per day  . lidocaine (cardiac) 100 mg/69ml      . mupirocin ointment  1 application Nasal BID  . pantoprazole (PROTONIX) IV  40 mg Intravenous QHS  . succinylcholine        Continuous Infusions: . sodium chloride 50 mL/hr at 11/23/13 1108  . dextrose 5 % and 0.45% NaCl Stopped  (11/23/13 0731)  . fentaNYL infusion INTRAVENOUS 100 mcg/hr (11/23/13 1123)  . insulin (NOVOLIN-R) infusion 10.8 Units/hr (11/23/13 1302)    Past Medical History  Diagnosis Date  . Hyperlipidemia   . Alopecia   . Obesity   . Iron deficiency   . ESRD (end stage renal disease)     S/p pancreatic and kidney transplant, but back on HD 2008, dialysis at [facility], [place], Sat  . RLS (restless legs syndrome)   . Injury of conjunctiva and corneal abrasion of right eye without foreign body   . Cellulitis   . Immunosuppression 08/01/11    Dr Joelyn Oms spoke with [facility] transplant regarding immunosuppression (Apr 08, 2013). 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 MMF stopped in November 2014 and prograf / prednisone weaned off early 2015, now off all immunosuppression as of Jun 2015.   Marland Kitchen Anemia   . Blood transfusion 2004    "when I had my transplant"  . Migraine   . Hyperparathyroidism, secondary   . Diabetic retinopathy   . Hypertension     takes Metoprolol and Exforge daily  . Depression     takes Klonopin nightly  . Diabetes mellitus type 1     Pt states diagnosed at age 44 with prior episodes of DKA. S/p pancreatic transplant   . GERD (gastroesophageal reflux disease)     takes Protonix daily  . Bronchitis   . Migraine   . Asthma   . Emphysema of lung   . Angina     none in past 2 years.  . Stomach pain     Hx: of chronic  . Pneumonia 2012    "double"  . Dialysis patient     tues,thurs,sat    Past Surgical History  Procedure Laterality Date  . 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 Thyroglobulin      x 7  . Av fistula placement      right upper arm  . Cholecystectomy  1995  .  hd graft placement/removal      "had 2 in my left upper arm"  . Eye surgery    . Retinopathy surgery    . Tooth extraction  10/10/11    X's two  . Insertion of dialysis catheter   12/08/2011    Procedure: INSERTION OF DIALYSIS CATHETER;  Surgeon: Angelia Mould, MD;  Location: [place];  Service: Vascular;  Laterality: Left;  . Av fistula placement  01/22/2012    Procedure: INSERTION OF ARTERIOVENOUS (AV) GORE-TEX GRAFT ARM;  Surgeon: Angelia Mould, MD;  Location: [place];  Service: Vascular;  Laterality: Left;  Ultrasound guided.  . Av fistula placement  03/14/2012    Procedure: INSERTION OF ARTERIOVENOUS (AV) GORE-TEX GRAFT THIGH;  Surgeon: Angelia Mould, MD;  Location: [place];  Service: Vascular;  Laterality: Right;  . False aneurysm repair Right 01/02/2013    Procedure: Excision of lymphocele in right thigh.;  Surgeon: Angelia Mould, MD;  Location: [place];  Service: Vascular;  Laterality: Right;  . Carpal tunnel release Right 03/02/2013    Procedure: CARPAL TUNNEL RELEASE;  Surgeon: Tennis Must, MD;  Location: [place];  Service: Orthopedics;  Laterality: Right;  . Flexible sigmoidoscopy N/A 03/24/2013    Procedure: FLEXIBLE SIGMOIDOSCOPY;  Surgeon: Cleotis Nipper, MD;  Location: [facility] ENDOSCOPY;  Service: Endoscopy;  Laterality: N/A;  . Revision of arteriovenous goretex graft Right 04/02/2013    Procedure: REPAIR OF PSEUDOANEURYSM OF ARTERIOVENOUS GORETEX GRAFT;  Surgeon: Angelia Mould, MD;  Location: [place];  Service: Vascular;  Laterality: Right;  . Carpal tunnel release Left 10/16/2013    Procedure: LEFT CARPAL TUNNEL RELEASE;  Surgeon: Tennis Must, MD;  Location: [place];  Service: Orthopedics;  Laterality: Left;    Molli Barrows, RD, LDN, [place] Pager [phone] After Hours Pager [phone]

## 2013-11-23 NOTE — Progress Notes (Signed)
ANTIBIOTIC CONSULT NOTE - INITIAL  Pharmacy Consult for Aztreonam  Indication: rule out pneumonia  Allergies  Allergen Reactions  . Depakote [Divalproex Sodium] Other (See Comments)    Pt gets "delirious"  . Morphine And Related Nausea And Vomiting and Other (See Comments)    "makes me delirious"  . Penicillins Anaphylaxis    Tolerated Zosyn Dec 2014  . Tramadol Nausea And Vomiting  . Imitrex [Sumatriptan] Other (See Comments)    seizures  . Vicodin [Hydrocodone-Acetaminophen] Itching and Rash    Patient Measurements: Height: 5' 1.02" (155 cm) Weight: 151 lb 7.3 oz (68.7 kg) IBW/kg (Calculated) : 47.86  Vital Signs: Temp: 97.3 F (36.3 C) (06/29 0040) Temp src: Oral (06/29 0040) BP: 126/61 mmHg (06/29 0320) Pulse Rate: 78 (06/29 0320)  Labs:  Recent Labs  11/23/13 0150 11/23/13 0332  WBC 13.1*  --   HGB 11.1* 13.6  PLT 249  --   CREATININE 8.18* 8.90*   Estimated Creatinine Clearance: 7.4 ml/min (by C-G formula based on Cr of 8.9).  Medical History: Past Medical History  Diagnosis Date  . Hyperlipidemia   . Alopecia   . Obesity   . Iron deficiency   . ESRD (end stage renal disease)     S/p pancreatic and kidney transplant, but back on HD 2008, dialysis at [facility], [place], Sat  . RLS (restless legs syndrome)   . Injury of conjunctiva and corneal abrasion of right eye without foreign body   . Cellulitis   . Immunosuppression 08/01/11    Dr Joelyn Oms spoke with [facility] transplant regarding immunosuppression (Apr 08, 2013). 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 MMF stopped in November 2014.  After one month plan is to taper off tacrolimus (decrease from bid to qday for 1 week then reduce dose by 50% for one week then stop). Prednisone should then be the last to come   . Anemia   . Blood transfusion 2004    "when I had my transplant"  . Migraine   . Hyperparathyroidism, secondary   .  Diabetic retinopathy   . Hypertension     takes Metoprolol and Exforge daily  . Depression     takes Klonopin nightly  . Diabetes mellitus type 1     Pt states diagnosed at age 55 with prior episodes of DKA. S/p pancreatic transplant   . GERD (gastroesophageal reflux disease)     takes Protonix daily  . Bronchitis   . Migraine   . Asthma   . Emphysema of lung   . Angina     none in past 2 years.  . Stomach pain     Hx: of chronic  . Pneumonia 2012    "double"  . Dialysis patient     tues,thurs,sat    Assessment: 43 y/o F with ESRD on HD, comes in with DKA, now intubated, needing emergent hemodialysis, WBC 13.1, other labs as above.   Goal of Therapy:  Clinical resolution   Plan:  -Aztreonam 1g IV x 1 now, then 1g IV q24h (after HD on HD days) -Trend WBC, temp, HD schedule -F/U continuation of vancomycin   Narda Bonds 11/23/2013,3:39 AM

## 2013-11-23 NOTE — ED Provider Notes (Signed)
CSN: [id number]     Arrival date & time 11/23/13  0008 History   First MD Initiated Contact with Patient 11/23/13 0209     Chief Complaint  Patient presents with  . Emesis     (Consider location/radiation/quality/duration/timing/severity/associated sxs/prior Treatment) HPI Comments: 43 year old female with significant medical history including end-stage renal disease on dialysis, unknown last dialysis, anemia, lipids, diabetes presented to the ER with vomiting, lightheadedness and general weakness. The department was busy and patient was not seen initially and only triage nurse evaluated the patient on arrival. During my assessment in the ER patient very lethargic and difficulty obtaining any history except that she was very weak and lightheaded.  Patient is a 43 y.o. female presenting with vomiting. The history is provided by the patient and medical records.  Emesis   Past Medical History  Diagnosis Date  . Hyperlipidemia   . Alopecia   . Obesity   . Iron deficiency   . 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 RLS (restless legs syndrome)   . Injury of conjunctiva and corneal abrasion of right eye without foreign body   . Cellulitis   . Immunosuppression 08/01/11    Dr Joelyn Oms spoke with [facility] transplant regarding immunosuppression (Apr 08, 2013). 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 [facility] stopped in November 2014.  After one month plan is to taper off tacrolimus (decrease from bid to qday for 1 week then reduce dose by 50% for one week then stop). Prednisone should then be the last to come   . Anemia   . Blood transfusion 2004    "when I had my transplant"  . Migraine   . Hyperparathyroidism, secondary   . Diabetic retinopathy   . Hypertension     takes Metoprolol and Exforge daily  . Depression     takes  Klonopin nightly  . Diabetes mellitus type 1     Pt states diagnosed at age 52 with prior episodes of DKA. S/p pancreatic transplant   . GERD (gastroesophageal reflux disease)     takes Protonix daily  . Bronchitis   . Migraine   . Asthma   . Emphysema of lung   . Angina     none in past 2 years.  . Stomach pain     Hx: of chronic  . Pneumonia 2012    "double"  . Dialysis patient     tues,thurs,sat   Past Surgical History  Procedure Laterality Date  . 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 Thyroglobulin      x 7  . Av fistula placement      right upper arm  . Cholecystectomy  1995  .  hd graft placement/removal      "had 2 in my left upper arm"  . Eye surgery    . Retinopathy surgery    . Tooth extraction  10/10/11    X's two  . Insertion of dialysis catheter  12/08/2011    Procedure: INSERTION OF DIALYSIS CATHETER;  Surgeon: Angelia Mould, MD;  Location: [place];  Service: Vascular;  Laterality: Left;  . Av fistula placement  01/22/2012    Procedure: INSERTION OF ARTERIOVENOUS (AV) GORE-TEX GRAFT ARM;  Surgeon: Angelia Mould, MD;  Location: [place];  Service: Vascular;  Laterality: Left;  Ultrasound guided.  . Av fistula placement  03/14/2012    Procedure: INSERTION OF ARTERIOVENOUS (AV) GORE-TEX GRAFT THIGH;  Surgeon: Angelia Mould, MD;  Location: [place];  Service: Vascular;  Laterality: Right;  . False aneurysm repair Right 01/02/2013    Procedure: Excision of lymphocele in right thigh.;  Surgeon: Angelia Mould, MD;  Location: [place];  Service: Vascular;  Laterality: Right;  . Carpal tunnel release Right 03/02/2013    Procedure: CARPAL TUNNEL RELEASE;  Surgeon: Tennis Must, MD;  Location: [place];  Service: Orthopedics;  Laterality: Right;  . Flexible sigmoidoscopy N/A 03/24/2013    Procedure: FLEXIBLE SIGMOIDOSCOPY;  Surgeon: Cleotis Nipper, MD;  Location: [facility] ENDOSCOPY;  Service: Endoscopy;   Laterality: N/A;  . Revision of arteriovenous goretex graft Right 04/02/2013    Procedure: REPAIR OF PSEUDOANEURYSM OF ARTERIOVENOUS GORETEX GRAFT;  Surgeon: Angelia Mould, MD;  Location: [place];  Service: Vascular;  Laterality: Right;  . Carpal tunnel release Left 10/16/2013    Procedure: LEFT CARPAL TUNNEL RELEASE;  Surgeon: Tennis Must, MD;  Location: [place];  Service: Orthopedics;  Laterality: Left;   Family History  Problem Relation Age of Onset  . Hypertension Mother   . Kidney disease Mother   . Diabetes Mother   . Hypertension Father   . Kidney disease Father   . Diabetes Father    History  Substance Use Topics  . Smoking status: Never Smoker   . Smokeless tobacco: Never Used  . Alcohol Use: No   OB History   Grav Para Term Preterm Abortions TAB SAB Ect Mult Living                 Review of Systems  Unable to perform ROS: Acuity of condition  Gastrointestinal: Positive for vomiting.      Allergies  Depakote; Morphine and related; Penicillins; Tramadol; Imitrex; and Vicodin  Home Medications   Prior to Admission medications   Medication Sig Start Date End Date Taking? Authorizing Provider  albuterol (PROVENTIL HFA;VENTOLIN HFA) 108 (90 BASE) MCG/ACT inhaler Inhale 2 puffs into the lungs every 4 (four) hours as needed for wheezing or shortness of breath (as per home regimen). 02/20/13   Kinnie Feil, MD  calcium acetate (PHOSLO) 667 MG capsule Take 1 capsule (667 mg total) by mouth 3 (three) times daily with meals. 05/10/13   Hosie Poisson, MD  clonazePAM (KLONOPIN) 0.5 MG tablet Take 1 mg by mouth at bedtime.     Historical Provider, MD  EXFORGE 10-160 MG per tablet Take 1 tablet by mouth at bedtime. Take on tues,thur, Saturday and sunday 06/15/12   Historical Provider, MD  FLUoxetine (PROZAC) 20 MG capsule Take 20 mg by mouth at bedtime.    Historical Provider, MD  gabapentin (NEURONTIN) 100 MG capsule Take 100 mg by mouth daily.     Historical Provider, MD  insulin aspart (NOVOLOG FLEXPEN) 100 UNIT/ML FlexPen Inject 6 Units into the skin 2 (two) times daily with a meal.    Historical Provider, MD  Insulin Glargine (LANTUS) 100 UNIT/ML Solostar Pen Inject 7 Units into the skin 2 (two) times daily with a meal.    Historical Provider, MD  metoprolol succinate (TOPROL-XL) 100 MG 24 hr tablet Take 100 mg by mouth at bedtime. Take with or immediately following a meal.    Historical Provider, MD  montelukast (SINGULAIR) 10 MG tablet Take 10 mg by mouth at bedtime.  Historical Provider, MD  multivitamin (RENA-VIT) TABS tablet Take 1 tablet by mouth daily.    Historical Provider, MD  oxyCODONE-acetaminophen (PERCOCET) 5-325 MG per tablet 1-2 tabs po q6 hours prn pain 10/16/13   Tennis Must, MD  oxyCODONE-acetaminophen (PERCOCET) 5-325 MG per tablet 1 to 2 tablets every 6 hours as needed for pain. 11/07/13   Harden Mo, MD  pantoprazole (PROTONIX) 40 MG tablet Take 1 tablet (40 mg total) by mouth at bedtime. 03/27/13   Hosie Poisson, MD  zolpidem (AMBIEN CR) 12.5 MG CR tablet Take 12.5 mg by mouth at bedtime.    Historical Provider, MD   BP 90/59  Pulse 95  Temp(Src) 97.3 F (36.3 C) (Oral)  Resp 32  Ht 5' 1.02" (1.55 m)  Wt 151 lb 7.3 oz (68.7 kg)  BMI 28.60 kg/m2  SpO2 100% Physical Exam  Nursing note and vitals reviewed. Constitutional: She appears well-developed. She appears lethargic. She appears distressed.  HENT:  Head: Normocephalic and atraumatic.  Severe dry mucous membranes, supple neck  Eyes: Pupils are equal, round, and reactive to light. Right eye exhibits no discharge. Left eye exhibits no discharge. No scleral icterus.  Neck: Normal range of motion. Neck supple.  Cardiovascular: Tachycardia present.   Pulmonary/Chest:  Decreased effort, difficult exam to 2 decreased effort no focal rales.  Abdominal: Soft. She exhibits no distension. There is no guarding.  Musculoskeletal: She exhibits no edema.   Neurological: She appears lethargic. GCS eye subscore is 2. GCS verbal subscore is 4. GCS motor subscore is 4.  Patient lethargic, with painful stimuli patient moves all extremities equal with 4+ strength in attempt to localize pain  Skin: Skin is warm. No erythema.  Psychiatric:  lethargy    ED Course  CENTRAL LINE Date/Time: 11/23/2013 8:13 AM Performed by: Mariea Clonts Authorized by: Mariea Clonts Consent: The procedure was performed in an emergent situation. Patient identity confirmed: arm band Time out: Immediately prior to procedure a "time out" was called to verify the correct patient, procedure, equipment, support staff and site/side marked as required. Indications: vascular access Patient sedated: no Preparation: skin prepped with 2% chlorhexidine Skin prep agent dried: skin prep agent completely dried prior to procedure Hand hygiene: hand hygiene performed prior to central venous catheter insertion Location details: left femoral Patient position: flat Catheter type: triple lumen Catheter size: 7.5 Fr Ultrasound guidance: yes Number of attempts: 1 Successful placement: yes Post-procedure: line sutured and dressing applied Assessment: blood return through all ports Patient tolerance: Patient tolerated the procedure well with no immediate complications.   (including critical care time) CRITICAL CARE Performed by: Mariea Clonts   Total critical care time: 150 min  Critical care time was exclusive of separately billable procedures and treating other patients.  Critical care was necessary to treat or prevent imminent or life-threatening deterioration.  Critical care was time spent personally by me on the following activities: development of treatment plan with patient and/or surrogate as well as nursing, discussions with consultants, evaluation of patient's response to treatment, examination of patient, obtaining history from patient or surrogate, ordering and  performing treatments and interventions, ordering and review of laboratory studies, ordering and review of radiographic studies, pulse oximetry and re-evaluation of patient's condition.  INTUBATION Performed by: Mariea Clonts  Required items: required blood products, implants, devices, and special equipment available Patient identity confirmed: provided demographic data and hospital-assigned identification number Time out: Immediately prior to procedure a "time out" was called to verify  the correct patient, procedure, equipment, support staff.  Indications: ams, airway protection Intubation method: direct Preoxygenation: [place] Sedatives: Etomidate Paralytic: roc Tube Size: 7.5 cuffed  Post-procedure assessment: chest rise and ETCO2 monitor Breath sounds: equal and absent over the epigastrium Tube secured with: ETT holder Chest x-ray interpreted by me.  Chest x-ray findings: endotracheal tube right mainstem, adjusted and cxr repeated improved  Patient tolerated the procedure well with no immediate complications.  Emergency Ultrasound Study:   Angiocath insertion Performed by: Mariea Clonts  Consent: Verbal consent obtained. Risks and benefits: risks, benefits and alternatives were discussed Immediately prior to procedure the correct patient, procedure, equipment, support staff and site/side marked as needed.  Indication: difficult IV access Preparation: Patient was prepped and draped in the usual sterile fashion. Vein Location: right ac vein was visualized during assessment for potential access sites and was found to be patent/ easily compressed with linear ultrasound.  The needle was visualized with real-time ultrasound and guided into the vein. Gauge: 18g  Image saved and stored.  Normal blood return.  Patient tolerance: Patient tolerated the procedure well with no immediate complications.   Emergency Ultrasound Study:   Angiocath insertion Performed by: Mariea Clonts  Consent: Verbal consent obtained. Risks and benefits: risks, benefits and alternatives were discussed Immediately prior to procedure the correct patient, procedure, equipment, support staff and site/side marked as needed.  Indication: difficult IV access Preparation: Patient was prepped and draped in the usual sterile fashion. Vein Location: left femoral vein was visualized during assessment for potential access sites and was found to be patent/ easily compressed with linear ultrasound.  The needle was visualized with real-time ultrasound and guided into the vein. Gauge: 7.5 F  Image saved and stored.  Normal blood return.  Patient tolerance: Patient tolerated the procedure well with no immediate complications.     Labs Review Labs Reviewed  MRSA PCR SCREENING - Abnormal; Notable for the following:    MRSA by PCR POSITIVE (*)    All other components within normal limits  CBC WITH DIFFERENTIAL - Abnormal; Notable for the following:    WBC 13.1 (*)    RBC 3.46 (*)    Hemoglobin 11.1 (*)    MCV 111.8 (*)    MCHC 28.7 (*)    RDW 15.8 (*)    Neutrophils Relative % 84 (*)    Lymphocytes Relative 10 (*)    Neutro Abs 11.0 (*)    All other components within normal limits  COMPREHENSIVE METABOLIC PANEL - Abnormal; Notable for the following:    Sodium 109 (*)    Potassium >7.7 (*)    Chloride <65 (*)    CO2 <7 (*)    Glucose, Bld 1441 (*)    BUN 50 (*)    Creatinine, Ser 8.18 (*)    Calcium 7.1 (*)    AST 71 (*)    Alkaline Phosphatase 210 (*)    GFR calc non Af Amer 5 (*)    GFR calc Af Amer 6 (*)    All other components within normal limits  BASIC METABOLIC PANEL - Abnormal; Notable for the following:    Sodium 129 (*)    Chloride 76 (*)    CO2 11 (*)    BUN 49 (*)    Creatinine, Ser 8.22 (*)    Calcium 7.2 (*)    GFR calc non Af Amer 5 (*)    GFR calc Af Amer 6 (*)    All other components  within normal limits  BASIC METABOLIC PANEL - Abnormal; Notable for  the following:    Sodium 128 (*)    Chloride 75 (*)    CO2 11 (*)    BUN 49 (*)    Creatinine, Ser 8.18 (*)    Calcium 7.2 (*)    GFR calc non Af Amer 5 (*)    GFR calc Af Amer 6 (*)    All other components within normal limits  AMYLASE - Abnormal; Notable for the following:    Amylase 273 (*)    All other components within normal limits  COMPREHENSIVE METABOLIC PANEL - Abnormal; Notable for the following:    Sodium 128 (*)    Chloride 76 (*)    CO2 11 (*)    BUN 50 (*)    Creatinine, Ser 8.25 (*)    Calcium 7.1 (*)    Albumin 2.7 (*)    AST 130 (*)    ALT 43 (*)    Alkaline Phosphatase 176 (*)    GFR calc non Af Amer 5 (*)    GFR calc Af Amer 6 (*)    All other components within normal limits  PHOSPHORUS - Abnormal; Notable for the following:    Phosphorus 11.9 (*)    All other components within normal limits  CBC - Abnormal; Notable for the following:    WBC 12.7 (*)    RBC 3.02 (*)    Hemoglobin 9.8 (*)    HCT 31.3 (*)    MCV 103.6 (*)    All other components within normal limits  LACTIC ACID, PLASMA - Abnormal; Notable for the following:    Lactic Acid, Venous 10.0 (*)    All other components within normal limits  KETONES, QUALITATIVE - Abnormal; Notable for the following:    Acetone, Bld MODERATE (*)    All other components within normal limits  GLUCOSE, CAPILLARY - Abnormal; Notable for the following:    Glucose-Capillary >600 (*)    All other components within normal limits  GLUCOSE, CAPILLARY - Abnormal; Notable for the following:    Glucose-Capillary >600 (*)    All other components within normal limits  CBG MONITORING, ED - Abnormal; Notable for the following:    Glucose-Capillary >600 (*)    All other components within normal limits  I-STAT CG4 LACTIC ACID, ED - Abnormal; Notable for the following:    Lactic Acid, Venous 7.69 (*)    All other components within normal limits  I-STAT CHEM 8, ED - Abnormal; Notable for the following:    Sodium 113 (*)     Potassium 7.3 (*)    Chloride 90 (*)    BUN 44 (*)    Creatinine, Ser 8.90 (*)    Glucose, Bld >700 (*)    Calcium, Ion 0.88 (*)    All other components within normal limits  I-STAT ARTERIAL BLOOD GAS, ED - Abnormal; Notable for the following:    pH, Arterial 6.992 (*)    pCO2 arterial 33.7 (*)    pO2, Arterial 341.0 (*)    Bicarbonate 8.2 (*)    Acid-base deficit 23.0 (*)    All other components within normal limits  CBG MONITORING, ED - Abnormal; Notable for the following:    Glucose-Capillary >600 (*)    All other components within normal limits  POCT I-STAT 3, ART BLOOD GAS (G3+) - Abnormal; Notable for the following:    pH, Arterial 7.007 (*)    pCO2 arterial 30.0 (*)  pO2, Arterial 145.0 (*)    Bicarbonate 7.6 (*)    Acid-base deficit 23.0 (*)    All other components within normal limits  CULTURE, BLOOD (ROUTINE X 2)  CULTURE, BLOOD (ROUTINE X 2)  CULTURE, BLOOD (ROUTINE X 2)  CULTURE, BLOOD (ROUTINE X 2)  URINE CULTURE  CULTURE, RESPIRATORY (NON-EXPECTORATED)  MRSA PCR SCREENING  MAGNESIUM  LIPASE, BLOOD  URINALYSIS, ROUTINE W REFLEX MICROSCOPIC  BASIC METABOLIC PANEL  BASIC METABOLIC PANEL  BASIC METABOLIC PANEL  BASIC METABOLIC PANEL  BASIC METABOLIC PANEL  BASIC METABOLIC PANEL  BASIC METABOLIC PANEL  BASIC METABOLIC PANEL  RENAL FUNCTION PANEL  RENAL FUNCTION PANEL  BLOOD GAS, ARTERIAL    Imaging Review Dg Chest Portable 1 View  11/23/2013   CLINICAL DATA:  Repositioned endotracheal tube.  EXAM: PORTABLE CHEST - 1 VIEW  COMPARISON:  Chest radiograph November 23, 2013 at 0309 hr and chest radiograph June 27, 2013  FINDINGS: Retracted endotracheal tube, distal tip at the carina. Re-expanded left lung with patchy airspace opacities. No pleural effusions. No pneumothorax  Cardiac silhouette appears mildly enlarged, mediastinal silhouette is nonsuspicious. Multiple EKG lines overlie the patient and may obscure subtle underlying pathology. Soft tissue planes  and included osseous structures are unchanged.  IMPRESSION: Retracted endotracheal tube, distal tip at the carina, recommend 2 cm of further retraction.  Re-expanded left lung with patchy airspace opacities, nonspecific.  Mild cardiomegaly.   Electronically Signed   By: Elon Alas   On: 11/23/2013 04:14   Dg Chest Portable 1 View  11/23/2013   CLINICAL DATA:  Emesis  EXAM: PORTABLE CHEST - 1 VIEW  COMPARISON:  06/27/2013  FINDINGS: Right mainstem intubation. The tube would need to be retracted 5 cm to reach the mid thoracic trachea. There is complete collapse of the left lung, with leftward shift of the mediastinum. The tube has been retracted at time of dictation. The heart size is obscured. The right lung is well aerated. No pneumothorax.  IMPRESSION: Right mainstem intubation and left lung collapse.   Electronically Signed   By: Jorje Guild M.D.   On: 11/23/2013 04:01   Dg Abd Portable 1v  11/23/2013   CLINICAL DATA:  Central line placement, emesis.  EXAM: PORTABLE ABDOMEN - 1 VIEW  COMPARISON:  None.  FINDINGS: Left femoral line in place, distal tip projecting over the sacrum.  Bowel gas pattern is nondilated and nonobstructive, with overall paucity of bowel gas. A staple line in the in the right lower quadrant, surgical clips in the right upper quadrant, subcentimeter calcifications in the the left abdomen may are present nephrolithiasis. Soft tissue planes and included osseous structures are nonsuspicious.  : Left femoral line distal tip projects at the sacrum.  Nonspecific bowel gas pattern.   Electronically Signed   By: Elon Alas   On: 11/23/2013 04:13     EKG Interpretation   Date/Time:  Monday November 23 2013 02:23:32 EDT Ventricular Rate:  56 PR Interval:  177 QRS Duration: 163 QT Interval:  688 QTC Calculation: 664 R Axis:   160 Text Interpretation:  Sinus rhythm RBBB and LPFB Repol abnrm, global  ischemia, diffuse leads Concern for hyperK, wide QRS, wide QT, ST  changes  Confirmed by ZAVITZ  MD, JOSHUA ([id number]) on 11/23/2013 8:11:43 AM     Repeat EKG showed heart rate 83, QRS and Q-T naris significantly, peak T waves in mild to inversions, normal axis MDM   Final diagnoses:  Acute encephalopathy  Type 1 diabetes mellitus  with ketoacidosis and coma  Hyperkalemia  QT prolongation  Hyponatremia  Dehydration  Hyperglycemia   Patient presented with vomiting, lightheadedness and general weakness. Emergency department is busy mild dilation found the patient severely lethargic lying in the bed barely moving and difficult to understand her speech. I had the nurse emergently put the patient on the monitor and I looked up her medical records in the computer showing end-stage renal disease and diabetes. Her basic blood work done in triage is pending except glucose was very high. Patient felt mild cool to touch and difficulty obtaining blood pressure assuming it was low. Difficult IV site multiple times by the nurse bedside ultrasound done by myself to place peripheral IV in the right. Monitor reviewed and showed widening QRS and prolonged QT. My concern was hyperkalemia. Lab was called and electrolytes pending, had calcium IV at the bedside while I emergently placed a central line in the left femoral vein ultrasound-guided. Bicarb boluses, albuterol neb. IV calcium given peripheral and later central line. Repeat EKG showed narrowing hours patient still lethargic. I discussed the case immediately with respiratory, x-ray and plan for intubation. Blood work returned showing significant lab abnormalities including hyper to see me, renal failure, metabolic acidosis, DKA among other abnormalities.  Immediately discussed the case with nephrology in ICU for further assistance. Patient was intubated emergently for airway protection and critical illness.  2 L fluid bolus were given and blood pressure improved to 824 systolic.  Antibiotics were ordered in case sepsis with  abnormal chest x-ray and altered mental status.  ET tube adjusted and repeat chest x-ray showed improvement still pneumonia and differential reviewed by myself.  Is present the patient's bedside for greater than 2 hours for critical care and multiple rechecks after that. Patient gradually improved in the ER vitals, neuro status difficult to assess due to sedation.  Repeat EKGs were reviewed and improved.  Patient was admitted in critical condition to the ICU.   Mariea Clonts, MD 11/23/13 [phone]

## 2013-11-23 NOTE — Progress Notes (Signed)
CRITICAL VALUE ALERT  Critical value received:  Potassium 2.9  Date of notification:  11/23/2013  Time of notification:  1700  Critical value read back:Yes.    Nurse who received alert:  Marisue Brooklyn  MD notified (1st page):  Dr Wert/Dr Jonnie Finner  Time of first page:  53  MD notified (2nd page):  Time of second page:  Responding MD:  Dr Jonnie Finner  Time MD responded:  [phone]

## 2013-11-23 NOTE — Progress Notes (Signed)
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 Presents to the ED complaining of weakness and dizziness. Found to be in severe DKA. Also severe hyperkalemia with wide QRS complexes. Intubated for airway protection. PCCM called to admit.   SIGNIFICANT EVENTS / STUDIES: 6/29 - Admitted 6/29 - vented  LINES / TUBES: ETT 6/29 >>> Left femoral CVC 6/29 >>> R Thigh AV Fistula R HD Cath  CULTURES: Blood cultures 6/29 >>> Urine culture 6/29 >>> Tracheal aspirate MRSA by PCR 6/29 >>> pos  ANTIBIOTICS: Aztreonam (PCN allergic) 6/29 >>> Vancomycin 6/29 >>>  INTERVAL HISTORY: Patient remains on insulin gtt, CBG 0800 6/29 1107. Remains on vent with full support as she is still markedly acidotic. Received 2 amps of bicarb, now on bicarb gtt.  VITAL SIGNS: Temp:  [97.3 F (36.3 C)-97.6 F (36.4 C)] 97.6 F (36.4 C) (06/29 0859) Pulse Rate:  [59-100] 95 (06/29 0743) Resp:  [15-35] 32 (06/29 0743) BP: (86-143)/(44-98) 90/59 mmHg (06/29 0743) SpO2:  [92 %-100 %] 100 % (06/29 0743) FiO2 (%):  [40 %-100 %] 40 % (06/29 0744) Weight:  [68.7 kg (151 lb 7.3 oz)] 68.7 kg (151 lb 7.3 oz) (06/29 0300) HEMODYNAMICS:   VENTILATOR SETTINGS: Vent Mode:  [-] CPAP;PSV FiO2 (%):  [40 %-100 %] 40 % Set Rate:  [18 bmp-35 bmp] 35 bmp Vt Set:  [370 mL-470 mL] 370 mL PEEP:  [5 cmH20] 5 cmH20 Pressure Support:  [5 cmH20] 5 cmH20 Plateau Pressure:  [24 cmH20] 24 cmH20 INTAKE / OUTPUT: Intake/Output     06/28 0701 - 06/29 0700 06/29 0701 - 06/30 0700   I.V. (mL/kg) 16.9 (0.2)    NG/GT 30    IV Piggyback 1000    Total Intake(mL/kg) 1046.9 (15.2)    Net +1046.9           PHYSICAL EXAMINATION: General: Well-developed AA female,  sedated, intubated, NAD. Neuro: Sedated and intubated. Follows commands. Coughing frequently with ETT. HEENT: PERRL, anicteric sclera. Oral mucosa pink and dry. ETT in place.  Heart: S1, S2. No m/g/r. No bruits, peripheral pulses present and equal. Lungs: On vent. Resps even and unlabored. Lungs clear anteriorly. Abdomen: Soft, non-tender and non-distended, BS +. Surgical scar present. Musculoskeletal: No acute deformities noted. Pedal pulses present and equal bil. Slight LE edema. Skin: Warm, dry, intact. No rashes noted.   LABS:  CBC  Recent Labs Lab 11/23/13 0150 11/23/13 0332 11/23/13 0500  WBC 13.1*  --  12.7*  HGB 11.1* 13.6 9.8*  HCT 38.7 40.0 31.3*  PLT 249  --  191   Coag's No results found for this basename: APTT, INR,  in the last 168 hours BMET  Recent Labs Lab 11/23/13 0500 11/23/13 0613 11/23/13 0750  NA 128* 128* 127*  K 4.5 4.5 4.0  CL 76* 75* 75*  CO2 11* 11* 10*  BUN 50* 49* 50*  CREATININE 8.25* 8.18* 8.36*  GLUCOSE 1107* 1107* 1105*   Electrolytes  Recent Labs Lab 11/23/13 0325  11/23/13 0500 11/23/13 0613 11/23/13 0750  CALCIUM  --   < > 7.1* 7.2* 7.2*  MG 2.4  --   --   --   --   PHOS  --   --  11.9*  --  11.6*  < > = values in this interval not displayed. Sepsis Markers  Recent Labs Lab 11/23/13 0334 11/23/13 0458  LATICACIDVEN 7.69* 10.0*   ABG  Recent Labs Lab 11/23/13 0331 11/23/13 0551 11/23/13 0823  PHART 6.992* 7.007* 7.109*  7.082*  PCO2ART 33.7* 30.0* 32.0*  35.7  PO2ART 341.0* 145.0* 153.0*  55.0*   Liver Enzymes  Recent Labs Lab 11/23/13 0150 11/23/13 0500 11/23/13 0750  AST 71* 130*  --   ALT 23 43*  --   ALKPHOS 210* 176*  --   BILITOT 0.5 0.3  --   ALBUMIN 3.5 2.7* 2.8*   Cardiac Enzymes No results found for this basename: TROPONINI, PROBNP,  in the last 168 hours Glucose  Recent Labs Lab 11/23/13 0141 11/23/13 0420 11/23/13 0538 11/23/13 0647  GLUCAP >600* >600* >600* >600*     Imaging Dg Chest Portable 1 View  11/23/2013   CLINICAL DATA:  Repositioned endotracheal tube.  EXAM: PORTABLE CHEST - 1 VIEW  COMPARISON:  Chest radiograph November 23, 2013 at 0309 hr and chest radiograph June 27, 2013  FINDINGS: Retracted endotracheal tube, distal tip at the carina. Re-expanded left lung with patchy airspace opacities. No pleural effusions. No pneumothorax  Cardiac silhouette appears mildly enlarged, mediastinal silhouette is nonsuspicious. Multiple EKG lines overlie the patient and may obscure subtle underlying pathology. Soft tissue planes and included osseous structures are unchanged.  IMPRESSION: Retracted endotracheal tube, distal tip at the carina, recommend 2 cm of further retraction.  Re-expanded left lung with patchy airspace opacities, nonspecific.  Mild cardiomegaly.   Electronically Signed   By: Elon Alas   On: 11/23/2013 04:14   Dg Chest Portable 1 View  11/23/2013   CLINICAL DATA:  Emesis  EXAM: PORTABLE CHEST - 1 VIEW  COMPARISON:  06/27/2013  FINDINGS: Right mainstem intubation. The tube would need to be retracted 5 cm to reach the mid thoracic trachea. There is complete collapse of the left lung, with leftward shift of the mediastinum. The tube has been retracted at time of dictation. The heart size is obscured. The right lung is well aerated. No pneumothorax.  IMPRESSION: Right mainstem intubation and left lung collapse.   Electronically Signed   By: Jorje Guild M.D.   On: 11/23/2013 04:01   Dg Abd Portable 1v  11/23/2013   CLINICAL DATA:  Central line placement, emesis.  EXAM: PORTABLE ABDOMEN - 1 VIEW  COMPARISON:  None.  FINDINGS: Left femoral line in place, distal tip projecting over the sacrum.  Bowel gas pattern is nondilated and nonobstructive, with overall paucity of bowel gas. A staple line in the in the right lower quadrant, surgical clips in the right upper quadrant, subcentimeter calcifications in the the left abdomen may are present  nephrolithiasis. Soft tissue planes and included osseous structures are nonsuspicious.  : Left femoral line distal tip projects at the sacrum.  Nonspecific bowel gas pattern.   Electronically Signed   By: Elon Alas   On: 11/23/2013 04:13   CXR:  Retracted endotracheal tube, distal tip at the carina, recommend 2  cm of further retraction.  Re-expanded left lung with patchy airspace opacities, nonspecific.  Mild cardiomegaly.  ASSESSMENT / PLAN:  PULMONARY A: 1) Acute respiratory failure due to inability to protect her airway 2) History of asthma (no active wheezing) P:   -NO weaning until ph corrected - Continue full vent support and increase rate- maxed - Albuterol PRN -bicarb for K, consider dc -abg  post HD when done -pcxr in am   CARDIOVASCULAR A:  1) Hemodynamically stable P:  - ICU monitoring  RENAL A:   1) ESRD on HD, last HD 6/27 2) Severe hyperkalemia 3) Pseudohyponatremia 4) Severe metabolic acidosis due to DKA ./ lactic P:   - Nephrology consulted - Will reduce fluids to some degree - Bicarb gtt started 6/29 am, consider dc this as K corrected and this is DKA likley driven, can prolong recovery - BMP Q2 hrs to q6h -repeat lactic acid, if not cleared , may need CT  GASTROINTESTINAL A:   1) GERD P:   - NPO as on insulin drip - IV protonix -lft to follow -may need CT  HEMATOLOGIC A:   1) leukoyctosis, macrocytosis - at risk myelodysplasia P:  - DVT prophylaxis with SQ heparin -O53, folic acid levels   -cbc  Per smear -take ETOH history  INFECTIOUS A:   1) Elevated WBC, no clear source of infection, lactic acidosis P:   - Empiric Aztreonam and vancomycin - Follow cultures -if lactic not improved, will CT abdo/pelvis  ENDOCRINE A:   1) Severe DKA / HONK in the setting of ESRD, AG 42 6/29 am P:   - DKA protocol - Isotonic saline until euvolemic - Insulin gtt per protocol  NEUROLOGIC A:   1) AMS likely metabolic   P:   - RASS  goal: -1 - Versed gtt - dc - Fentanyl PRN  Lowella Dell. Reese, PA-S  11/23/2013, 9:57 AM   Ccm time 3  Min   I have fully examined this patient and agree with above findings.    And edited infull  Lavon Paganini. Titus Mould, MD, [place] Pgr: [facility]

## 2013-11-23 NOTE — H&P (Signed)
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 Presents to the ED complaining of weakness and dizziness. Found to be on severe DKA. Also severe hyperkalemia with wide QRS complexes. Intubated for airway protection. PCCM called to admit.   SIGNIFICANT EVENTS / STUDIES:  - Initial post intubation chest X ray with right main stem intubation and complete left lung collapse. This resolved by pulling ETT to good position.  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 Presents to the ED complaining of weakness and dizziness. Found to be on severe DKA with pH of 6.9 and  initial BS of 1441. Also severe hyperkalemia of 7.7 with wide QRS complexes. Intubated for airway protection. PCCM called to admit. Unfortunately I was unable to obtain more history due to the patient altered mental status and no family available.   PAST MEDICAL HISTORY :  Past Medical History  Diagnosis Date  . Hyperlipidemia   . Alopecia   . Obesity   . Iron deficiency   . ESRD (end stage renal disease)     S/p pancreatic and kidney transplant, but back on HD 2008, dialysis at [facility], [place], Sat  . RLS (restless legs syndrome)   . Injury of conjunctiva and corneal abrasion of right eye without foreign body   . Cellulitis   . Immunosuppression 08/01/11    Dr Joelyn Oms spoke with  [facility] transplant regarding immunosuppression (Apr 08, 2013). 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 MMF stopped in November 2014.  After one month plan is to taper off tacrolimus (decrease from bid to qday for 1 week then reduce dose by 50% for one week then stop). Prednisone should then be the last to come   . Anemia   . Blood transfusion 2004    "when I had my transplant"  . Migraine   . Hyperparathyroidism, secondary   . Diabetic retinopathy   . Hypertension     takes Metoprolol and Exforge daily  . Depression     takes Klonopin nightly  . Diabetes mellitus type 1     Pt states diagnosed at age 69 with prior episodes of DKA. S/p pancreatic transplant   . GERD (gastroesophageal reflux disease)     takes Protonix daily  . Bronchitis   . Migraine   . Asthma   . Emphysema of lung   . Angina     none in past 2 years.  . Stomach pain     Hx: of chronic  . Pneumonia 2012    "double"  . Dialysis patient     tues,thurs,sat   Past Surgical History  Procedure Laterality Date  . 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 Thyroglobulin      x 7  .  Av fistula placement      right upper arm  . Cholecystectomy  1995  .  hd graft placement/removal      "had 2 in my left upper arm"  . Eye surgery    . Retinopathy surgery    . Tooth extraction  10/10/11    X's two  . Insertion of dialysis catheter  12/08/2011    Procedure: INSERTION OF DIALYSIS CATHETER;  Surgeon: Angelia Mould, MD;  Location: [place];  Service: Vascular;  Laterality: Left;  . Av fistula placement  01/22/2012    Procedure: INSERTION OF ARTERIOVENOUS (AV) GORE-TEX GRAFT ARM;  Surgeon: Angelia Mould, MD;  Location: [place];  Service: Vascular;  Laterality: Left;  Ultrasound guided.  . Av fistula placement  03/14/2012    Procedure: INSERTION OF ARTERIOVENOUS (AV) GORE-TEX GRAFT THIGH;  Surgeon: Angelia Mould, MD;  Location: [place];  Service: Vascular;  Laterality: Right;  . False aneurysm repair Right 01/02/2013    Procedure: Excision of lymphocele in right thigh.;  Surgeon: Angelia Mould, MD;  Location: [place];  Service: Vascular;  Laterality: Right;  . Carpal tunnel release Right 03/02/2013    Procedure: CARPAL TUNNEL RELEASE;  Surgeon: Tennis Must, MD;  Location: [place];  Service: Orthopedics;  Laterality: Right;  . Flexible sigmoidoscopy N/A 03/24/2013    Procedure: FLEXIBLE SIGMOIDOSCOPY;  Surgeon: Cleotis Nipper, MD;  Location: [facility] ENDOSCOPY;  Service: Endoscopy;  Laterality: N/A;  . Revision of arteriovenous goretex graft Right 04/02/2013    Procedure: REPAIR OF PSEUDOANEURYSM OF ARTERIOVENOUS GORETEX GRAFT;  Surgeon: Angelia Mould, MD;  Location: [place];  Service: Vascular;  Laterality: Right;  . Carpal tunnel release Left 10/16/2013    Procedure: LEFT CARPAL TUNNEL RELEASE;  Surgeon: Tennis Must, MD;  Location: [place];  Service: Orthopedics;  Laterality: Left;   Prior to Admission medications   Medication Sig Start Date End Date Taking? Authorizing Provider  albuterol (PROVENTIL HFA;VENTOLIN HFA) 108 (90 BASE) MCG/ACT inhaler Inhale 2 puffs into the lungs every 4 (four) hours as needed for wheezing or shortness of breath (as per home regimen). 02/20/13   Kinnie Feil, MD  calcium acetate (PHOSLO) 667 MG capsule Take 1 capsule (667 mg total) by mouth 3 (three) times daily with meals. 05/10/13   Hosie Poisson, MD  clonazePAM (KLONOPIN) 0.5 MG tablet Take 1 mg by mouth at bedtime.     Historical Provider, MD  EXFORGE 10-160 MG per tablet Take 1 tablet by mouth at bedtime. Take on tues,thur, Saturday and sunday 06/15/12   Historical Provider, MD  FLUoxetine (PROZAC) 20 MG capsule Take 20 mg by mouth at bedtime.    Historical Provider, MD  gabapentin (NEURONTIN) 100 MG capsule Take 100 mg by mouth daily.    Historical Provider, MD   insulin aspart (NOVOLOG FLEXPEN) 100 UNIT/ML FlexPen Inject 6 Units into the skin 2 (two) times daily with a meal.    Historical Provider, MD  Insulin Glargine (LANTUS) 100 UNIT/ML Solostar Pen Inject 7 Units into the skin 2 (two) times daily with a meal.    Historical Provider, MD  metoprolol succinate (TOPROL-XL) 100 MG 24 hr tablet Take 100 mg by mouth at bedtime. Take with or immediately following a meal.    Historical Provider, MD  montelukast (SINGULAIR) 10 MG tablet Take 10 mg by mouth at bedtime.    Historical Provider, MD  multivitamin (RENA-VIT) TABS tablet Take 1 tablet by  mouth daily.    Historical Provider, MD  oxyCODONE-acetaminophen (PERCOCET) 5-325 MG per tablet 1-2 tabs po q6 hours prn pain 10/16/13   Tennis Must, MD  oxyCODONE-acetaminophen (PERCOCET) 5-325 MG per tablet 1 to 2 tablets every 6 hours as needed for pain. 11/07/13   Harden Mo, MD  pantoprazole (PROTONIX) 40 MG tablet Take 1 tablet (40 mg total) by mouth at bedtime. 03/27/13   Hosie Poisson, MD  zolpidem (AMBIEN CR) 12.5 MG CR tablet Take 12.5 mg by mouth at bedtime.    Historical Provider, MD   Allergies  Allergen Reactions  . Depakote [Divalproex Sodium] Other (See Comments)    Pt gets "delirious"  . Morphine And Related Nausea And Vomiting and Other (See Comments)    "makes me delirious"  . Penicillins Anaphylaxis    Tolerated Zosyn Dec 2014  . Tramadol Nausea And Vomiting  . Imitrex [Sumatriptan] Other (See Comments)    seizures  . Vicodin [Hydrocodone-Acetaminophen] Itching and Rash    FAMILY HISTORY:  Family History  Problem Relation Age of Onset  . Hypertension Mother   . Kidney disease Mother   . Diabetes Mother   . Hypertension Father   . Kidney disease Father   . Diabetes Father    SOCIAL HISTORY:  reports that she has never smoked. She has never used smokeless tobacco. She reports that she does not drink alcohol or use illicit drugs.  REVIEW OF SYSTEMS:  Unable to  provide  SUBJECTIVE:   VITAL SIGNS: Temp:  [97.3 F (36.3 C)] 97.3 F (36.3 C) (06/29 0040) Pulse Rate:  [59-83] 78 (06/29 0320) Resp:  [15-34] 34 (06/29 0345) BP: (86-143)/(44-72) 125/57 mmHg (06/29 0345) SpO2:  [92 %-100 %] 100 % (06/29 0355) FiO2 (%):  [50 %-100 %] 50 % (06/29 0355) Weight:  [151 lb 7.3 oz (68.7 kg)] 151 lb 7.3 oz (68.7 kg) (06/29 0300) HEMODYNAMICS:   VENTILATOR SETTINGS: Vent Mode:  [-] PRVC FiO2 (%):  [50 %-100 %] 50 % Set Rate:  [18 bmp] 18 bmp Vt Set:  [470 mL] 470 mL Pressure Support:  [5 cmH20] 5 cmH20 INTAKE / OUTPUT: Intake/Output   None     PHYSICAL EXAMINATION: General: Sedated, intubated, no acute distress. Eyes: Anicteric sclerae. Pupils are equal and reactive to light ENT: ETT in place. Trachea at midline.  Lymph: No cervical, supraclavicular, or axillary lymphadenopathy. Heart: Normal S1, S2. No murmurs, rubs, or gallops appreciated. No bruits, equal pulses. Lungs: Normal excursion, no dullness to percussion. Good air movement bilaterally, without wheezes or crackles.  Abdomen: Abdomen soft, non-tender and not distended, normoactive bowel sounds. Surgical scar present. No hepatosplenomegaly or masses. Musculoskeletal: No clubbing or synovitis. No LE edema Skin: No rashes or lesions Neuro: Patient is unresponsive.    LABS:  CBC  Recent Labs Lab 11/23/13 0150 11/23/13 0332  WBC 13.1*  --   HGB 11.1* 13.6  HCT 38.7 40.0  PLT 249  --    Coag's No results found for this basename: APTT, INR,  in the last 168 hours BMET  Recent Labs Lab 11/23/13 0150 11/23/13 0332  NA 109* 113*  K >7.7* 7.3*  CL <65* 90*  CO2 <7*  --   BUN 50* 44*  CREATININE 8.18* 8.90*  GLUCOSE 1441* >700*   Electrolytes  Recent Labs Lab 11/23/13 0150 11/23/13 0325  CALCIUM 7.1*  --   MG  --  2.4   Sepsis Markers  Recent Labs Lab 11/23/13 0334  LATICACIDVEN 7.69*  ABG  Recent Labs Lab 11/23/13 0331  PHART 6.992*  PCO2ART 33.7*   PO2ART 341.0*   Liver Enzymes  Recent Labs Lab 11/23/13 0150  AST 71*  ALT 23  ALKPHOS 210*  BILITOT 0.5  ALBUMIN 3.5   Cardiac Enzymes No results found for this basename: TROPONINI, PROBNP,  in the last 168 hours Glucose  Recent Labs Lab 11/23/13 0141 11/23/13 0420  GLUCAP >600* >600*    Imaging Dg Chest Portable 1 View  11/23/2013   CLINICAL DATA:  Repositioned endotracheal tube.  EXAM: PORTABLE CHEST - 1 VIEW  COMPARISON:  Chest radiograph November 23, 2013 at 0309 hr and chest radiograph June 27, 2013  FINDINGS: Retracted endotracheal tube, distal tip at the carina. Re-expanded left lung with patchy airspace opacities. No pleural effusions. No pneumothorax  Cardiac silhouette appears mildly enlarged, mediastinal silhouette is nonsuspicious. Multiple EKG lines overlie the patient and may obscure subtle underlying pathology. Soft tissue planes and included osseous structures are unchanged.  IMPRESSION: Retracted endotracheal tube, distal tip at the carina, recommend 2 cm of further retraction.  Re-expanded left lung with patchy airspace opacities, nonspecific.  Mild cardiomegaly.   Electronically Signed   By: Elon Alas   On: 11/23/2013 04:14   Dg Chest Portable 1 View  11/23/2013   CLINICAL DATA:  Emesis  EXAM: PORTABLE CHEST - 1 VIEW  COMPARISON:  06/27/2013  FINDINGS: Right mainstem intubation. The tube would need to be retracted 5 cm to reach the mid thoracic trachea. There is complete collapse of the left lung, with leftward shift of the mediastinum. The tube has been retracted at time of dictation. The heart size is obscured. The right lung is well aerated. No pneumothorax.  IMPRESSION: Right mainstem intubation and left lung collapse.   Electronically Signed   By: Jorje Guild M.D.   On: 11/23/2013 04:01   Dg Abd Portable 1v  11/23/2013   CLINICAL DATA:  Central line placement, emesis.  EXAM: PORTABLE ABDOMEN - 1 VIEW  COMPARISON:  None.  FINDINGS: Left femoral  line in place, distal tip projecting over the sacrum.  Bowel gas pattern is nondilated and nonobstructive, with overall paucity of bowel gas. A staple line in the in the right lower quadrant, surgical clips in the right upper quadrant, subcentimeter calcifications in the the left abdomen may are present nephrolithiasis. Soft tissue planes and included osseous structures are nonsuspicious.  : Left femoral line distal tip projects at the sacrum.  Nonspecific bowel gas pattern.   Electronically Signed   By: Elon Alas   On: 11/23/2013 04:13     CXR:  Retracted endotracheal tube, distal tip at the carina, recommend 2  cm of further retraction.  Re-expanded left lung with patchy airspace opacities, nonspecific.  Mild cardiomegaly.   ASSESSMENT / PLAN:  PULMONARY A: 1) Acute respiratory failure due to inability to protect her airway 2) History of asthma (no active wheezing) P:   - Mechanical ventilation   - PRVC, Vt: 8cc/kg, PEEP: 5, RR: 35, FiO2: 50% and adjust to keep O2 sat > 94%   - VAP prevention order set   - Daily awakening and SBT - Albuterol PRN  CARDIOVASCULAR A:  1) Hemodynamically stable P:  - ICU monitoring  RENAL A:   1) ESRD on HD, last HD 6/27 2) Severe hyperkalemia 3) Pseudohyponatremia 4) Severe metabolic acidosis due to DKA P:   - Nephrology consult. Input appreciated - Got 2 L NS in the ED - Will continue  NS at 125 cc/hr, likely lower rate soon due to ESRD - BMP q 2 hrs  GASTROINTESTINAL A:   1) GERD P:   - NPO - IV protonix  HEMATOLOGIC A:   1) No issues P:  - DVT prophylaxis with SQ heparin  INFECTIOUS A:   1) Elevated WBC, no clear source of infection P:   - Empiric Aztreonam and vancomycin - We will follow cultures  ENDOCRINE A:   1) Severe DKA in the setting of ESRD P:   -DKA protocol -isotonic saline until euvolemic -insulin gtt per protocol,  transition to sq when anion gap is normal, bicarb is >18  -transition to D51/2  NS after blood glucose is <250, transition to NSL when able to take pos.  -close observation of K, replace as indicated.  -close observation of PO4 and replace as indicated  NEUROLOGIC A:   1) AMS likely metabolic   P:   RASS goal: -1 - Versed drip - Fentanyl PRN  TODAY'S SUMMARY:   I have personally obtained a history, examined the patient, evaluated laboratory and imaging results, formulated the assessment and plan and placed orders. CRITICAL CARE: The patient is critically ill with multiple organ systems failure and requires high complexity decision making for assessment and support, frequent evaluation and titration of therapies, application of advanced monitoring technologies and extensive interpretation of multiple databases. Critical Care Time devoted to patient care services described in this note is 60 minutes.    Pulmonary and [place] Pager: [phone]  11/23/2013, 4:22 AM

## 2013-11-24 ENCOUNTER — Encounter (HOSPITAL_COMMUNITY): Payer: Self-pay | Admitting: Nephrology

## 2013-11-24 ENCOUNTER — Inpatient Hospital Stay (HOSPITAL_COMMUNITY): Payer: Medicare Other

## 2013-11-24 DIAGNOSIS — N185 Chronic kidney disease, stage 5: Secondary | ICD-10-CM

## 2013-11-24 DIAGNOSIS — D631 Anemia in chronic kidney disease: Secondary | ICD-10-CM

## 2013-11-24 DIAGNOSIS — J9601 Acute respiratory failure with hypoxia: Secondary | ICD-10-CM

## 2013-11-24 DIAGNOSIS — G9340 Encephalopathy, unspecified: Secondary | ICD-10-CM

## 2013-11-24 DIAGNOSIS — N039 Chronic nephritic syndrome with unspecified morphologic changes: Secondary | ICD-10-CM

## 2013-11-24 DIAGNOSIS — J9600 Acute respiratory failure, unspecified whether with hypoxia or hypercapnia: Secondary | ICD-10-CM

## 2013-11-24 LAB — BASIC METABOLIC PANEL
BUN: 50 mg/dL — ABNORMAL HIGH (ref 6–23)
BUN: 51 mg/dL — ABNORMAL HIGH (ref 6–23)
BUN: 50 mg/dL — AB (ref 6–23)
BUN: 40 mg/dL — ABNORMAL HIGH (ref 6–23)
CALCIUM: 7 mg/dL — AB (ref 8.4–10.5)
CALCIUM: 6.9 mg/dL — AB (ref 8.4–10.5)
CALCIUM: 6.9 mg/dL — AB (ref 8.4–10.5)
CHLORIDE: 92 meq/L — AB (ref 96–112)
CO2: 18 mEq/L — ABNORMAL LOW (ref 19–32)
CO2: 20 mEq/L (ref 19–32)
CO2: 22 meq/L (ref 19–32)
CO2: 24 mEq/L (ref 19–32)
CREATININE: 9.61 mg/dL — AB (ref 0.50–1.10)
CREATININE: 7.64 mg/dL — AB (ref 0.50–1.10)
Calcium: 6.7 mg/dL — ABNORMAL LOW (ref 8.4–10.5)
Chloride: 89 mEq/L — ABNORMAL LOW (ref 96–112)
Chloride: 90 mEq/L — ABNORMAL LOW (ref 96–112)
Chloride: 93 mEq/L — ABNORMAL LOW (ref 96–112)
Creatinine, Ser: 8.91 mg/dL — ABNORMAL HIGH (ref 0.50–1.10)
Creatinine, Ser: 9.3 mg/dL — ABNORMAL HIGH (ref 0.50–1.10)
GFR calc Af Amer: 5 mL/min — ABNORMAL LOW (ref >90)
GFR calc Af Amer: 5 mL/min — ABNORMAL LOW (ref >90)
GFR calc Af Amer: 7 mL/min — ABNORMAL LOW (ref >90)
GFR calc non Af Amer: 5 mL/min — ABNORMAL LOW (ref >90)
GFR calc non Af Amer: 4 mL/min — ABNORMAL LOW (ref >90)
GFR calc non Af Amer: 6 mL/min — ABNORMAL LOW (ref >90)
GFR, EST AFRICAN AMERICAN: 6 mL/min — AB (ref >90)
GFR, EST NON AFRICAN AMERICAN: 5 mL/min — AB (ref >90)
GLUCOSE: 115 mg/dL — AB (ref 70–99)
Glucose, Bld: 243 mg/dL — ABNORMAL HIGH (ref 70–99)
Glucose, Bld: 211 mg/dL — ABNORMAL HIGH (ref 70–99)
Glucose, Bld: 175 mg/dL — ABNORMAL HIGH (ref 70–99)
Potassium: 3.7 mEq/L (ref 3.7–5.3)
Potassium: 3.5 mEq/L — ABNORMAL LOW (ref 3.7–5.3)
Potassium: 3.9 mEq/L (ref 3.7–5.3)
Potassium: 4 mEq/L (ref 3.7–5.3)
SODIUM: 135 meq/L — AB (ref 137–147)
Sodium: 135 mEq/L — ABNORMAL LOW (ref 137–147)
Sodium: 138 mEq/L (ref 137–147)
Sodium: 136 mEq/L — ABNORMAL LOW (ref 137–147)

## 2013-11-24 LAB — GLUCOSE, CAPILLARY
GLUCOSE-CAPILLARY: 122 mg/dL — AB (ref 70–99)
GLUCOSE-CAPILLARY: 129 mg/dL — AB (ref 70–99)
GLUCOSE-CAPILLARY: 138 mg/dL — AB (ref 70–99)
GLUCOSE-CAPILLARY: 148 mg/dL — AB (ref 70–99)
GLUCOSE-CAPILLARY: 148 mg/dL — AB (ref 70–99)
GLUCOSE-CAPILLARY: 151 mg/dL — AB (ref 70–99)
GLUCOSE-CAPILLARY: 179 mg/dL — AB (ref 70–99)
GLUCOSE-CAPILLARY: 210 mg/dL — AB (ref 70–99)
Glucose-Capillary: 139 mg/dL — ABNORMAL HIGH (ref 70–99)
Glucose-Capillary: 100 mg/dL — ABNORMAL HIGH (ref 70–99)
Glucose-Capillary: 142 mg/dL — ABNORMAL HIGH (ref 70–99)
Glucose-Capillary: 149 mg/dL — ABNORMAL HIGH (ref 70–99)
Glucose-Capillary: 178 mg/dL — ABNORMAL HIGH (ref 70–99)
Glucose-Capillary: 205 mg/dL — ABNORMAL HIGH (ref 70–99)
Glucose-Capillary: 214 mg/dL — ABNORMAL HIGH (ref 70–99)
Glucose-Capillary: 220 mg/dL — ABNORMAL HIGH (ref 70–99)
Glucose-Capillary: 250 mg/dL — ABNORMAL HIGH (ref 70–99)
Glucose-Capillary: 274 mg/dL — ABNORMAL HIGH (ref 70–99)
Glucose-Capillary: 129 mg/dL — ABNORMAL HIGH (ref 70–99)
Glucose-Capillary: 208 mg/dL — ABNORMAL HIGH (ref 70–99)
Glucose-Capillary: 110 mg/dL — ABNORMAL HIGH (ref 70–99)
Glucose-Capillary: 129 mg/dL — ABNORMAL HIGH (ref 70–99)
Glucose-Capillary: 152 mg/dL — ABNORMAL HIGH (ref 70–99)
Glucose-Capillary: 156 mg/dL — ABNORMAL HIGH (ref 70–99)
Glucose-Capillary: 156 mg/dL — ABNORMAL HIGH (ref 70–99)
Glucose-Capillary: 160 mg/dL — ABNORMAL HIGH (ref 70–99)

## 2013-11-24 LAB — POCT I-STAT 3, ART BLOOD GAS (G3+)
Acid-base deficit: 3 mmol/L — ABNORMAL HIGH (ref 0.0–2.0)
Bicarbonate: 22.8 mEq/L (ref 20.0–24.0)
O2 Saturation: 99 %
PCO2 ART: 42.5 mmHg (ref 35.0–45.0)
Patient temperature: 98
TCO2: 24 mmol/L (ref 0–100)
pH, Arterial: 7.336 — ABNORMAL LOW (ref 7.350–7.450)
pO2, Arterial: 158 mmHg — ABNORMAL HIGH (ref 80.0–100.0)

## 2013-11-24 LAB — FOLATE RBC: RBC Folate: 970 ng/mL — ABNORMAL HIGH (ref >280)

## 2013-11-24 LAB — LACTIC ACID, PLASMA: Lactic Acid, Venous: 3.1 mmol/L — ABNORMAL HIGH (ref 0.5–2.2)

## 2013-11-24 MED ORDER — DOXERCALCIFEROL 4 MCG/2ML IV SOLN
5.0000 ug | INTRAVENOUS | Status: DC
  Administered 2013-11-24 – 2013-11-26 (×2): 5 ug via INTRAVENOUS
  Filled 2013-11-24 (×4): qty 4

## 2013-11-24 MED ORDER — DARBEPOETIN ALFA-POLYSORBATE 60 MCG/0.3ML IJ SOLN
60.0000 ug | INTRAMUSCULAR | Status: DC
  Administered 2013-11-24: 60 ug via INTRAVENOUS
  Filled 2013-11-24: qty 0.3

## 2013-11-24 MED ORDER — IOHEXOL 300 MG/ML  SOLN
25.0000 mL | INTRAMUSCULAR | Status: AC
  Administered 2013-11-24 (×2): 25 mL via ORAL

## 2013-11-24 MED ORDER — IOHEXOL 350 MG/ML SOLN: 100.0000 mL | Freq: Once | INTRAVENOUS | Status: AC | PRN

## 2013-11-24 MED ORDER — SODIUM CHLORIDE 0.9 % IV SOLN
62.5000 mg | INTRAVENOUS | Status: DC
  Administered 2013-11-26: 62.5 mg via INTRAVENOUS
  Filled 2013-11-24 (×2): qty 5

## 2013-11-24 MED ORDER — ACETAMINOPHEN 325 MG PO TABS
650.0000 mg | ORAL_TABLET | Freq: Four times a day (QID) | ORAL | Status: DC | PRN
  Administered 2013-11-24: 650 mg via ORAL
  Filled 2013-11-24: qty 2

## 2013-11-24 MED ORDER — BIOTENE DRY MOUTH MT LIQD
15.0000 mL | Freq: Two times a day (BID) | OROMUCOSAL | Status: DC
Start: 2013-11-24 — End: 2013-11-24

## 2013-11-24 MED ORDER — PNEUMOCOCCAL VAC POLYVALENT 25 MCG/0.5ML IJ INJ
0.5000 mL | INJECTION | INTRAMUSCULAR | Status: AC
  Administered 2013-11-25: 0.5 mL via INTRAMUSCULAR
  Filled 2013-11-24: qty 0.5

## 2013-11-24 MED ORDER — PENTAFLUOROPROP-TETRAFLUOROETH EX AERO
INHALATION_SPRAY | CUTANEOUS | Status: AC
  Administered 2013-11-24: 17:00:00
  Filled 2013-11-24: qty 103.5

## 2013-11-24 NOTE — Progress Notes (Signed)
[facility] Progress Note   Subjective: BS is better, serum CO2 up to 20 and K stable 3.8  Filed Vitals:   11/24/13 0400 11/24/13 0416 11/24/13 0500 11/24/13 0600  BP: 152/74  132/63 141/60  Pulse: 94  91 88  Temp:  98.4 F (36.9 C)    TempSrc:  Oral    Resp: 35  26 35  Height:      Weight:      SpO2: 100%  100% 100%   Exam: On vent, sedated, unresponsive No jvd Chest CTA bilat RRR 2/6 SEM Abd soft, dec'd BS, no ascites No LE edema, R thigh AVG patent Neuro sedated, nonresponsive No rash  HD: TTS NW F160 3h 48min   67.5kg  2/2.5 Bath   AVG R thigh    Heparin none Aranesp 60/wk on Tues   Hect 5ug TIW   Venofer 50 mg q Thurs   Assessment: 1 DKA / DM1- on insulin drip, better 2 Metabolic acidosis- improving 3 Hyperkalemia- resolved w Rx of DKA 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 6 Anemia cont aranesp, IV Fe 7 HTPH cont vit D  Plan- HD today, UF 1-2 kg    Kelly Splinter MD  pager [phone]    cell [phone]  11/24/2013, 7:30 AM     Recent Labs Lab 11/23/13 0500  11/23/13 0750  11/23/13 2158 11/24/13 0101 11/24/13 0400  NA 128*  < > 127*  < > 135* 135* 135*  K 4.5  < > 4.0  < > 3.2* 3.7 3.5*  CL 76*  < > 75*  < > 90* 89* 90*  CO2 11*  < > 10*  < > 22 18* 20  GLUCOSE 1107*  < > 1105*  < > 97 243* 211*  BUN 50*  < > 50*  < > 49* 50* 51*  CREATININE 8.25*  < > 8.36*  < > 8.62* 8.91* 9.30*  CALCIUM 7.1*  < > 7.2*  < > 7.1* 7.0* 6.9*  PHOS 11.9*  --  11.6*  --   --   --   --   < > = values in this interval not displayed.  Recent Labs Lab 11/23/13 0150 11/23/13 0500 11/23/13 0750  AST 71* 130*  --   ALT 23 43*  --   ALKPHOS 210* 176*  --   BILITOT 0.5 0.3  --   PROT 7.9 6.3  --   ALBUMIN 3.5 2.7* 2.8*    Recent Labs Lab 11/23/13 0150 11/23/13 0332 11/23/13 0500  WBC 13.1*  --  12.7*  NEUTROABS 11.0*  --   --   HGB 11.1* 13.6 9.8*  HCT 38.7 40.0 31.3*  MCV 111.8*  --  103.6*  PLT 249  --  191   .  antiseptic oral rinse  15 mL Mouth Rinse QID  . aztreonam  1 g Intravenous Q24H  . chlorhexidine  15 mL Mouth Rinse BID  . Chlorhexidine Gluconate Cloth  6 each Topical Q0600  . fentaNYL  50 mcg Intravenous Once  . heparin  5,000 Units Subcutaneous 3 times per day  . mupirocin ointment  1 application Nasal BID  . pantoprazole (PROTONIX) IV  40 mg Intravenous QHS   . sodium chloride 50 mL/hr at 11/23/13 1108  . dextrose 5 % and 0.45% NaCl Stopped (11/23/13 0731)  . fentaNYL infusion INTRAVENOUS 100 mcg/hr (11/23/13 1123)  . insulin (NOVOLIN-R) infusion 1.2 mL/hr at 11/24/13 0300  .  phenylephrine (NEO-SYNEPHRINE) Adult infusion Stopped (11/24/13 0100)   sodium chloride, dextrose, fentaNYL, fentaNYL, midazolam

## 2013-11-24 NOTE — Progress Notes (Signed)
Unable to obtain ordered CT scan due to discrepency in orders and patient without proper IV access. Discussed with Dr Titus Mould.

## 2013-11-24 NOTE — Progress Notes (Signed)
Inpatient Diabetes Program Recommendations  AACE/ADA: New Consensus Statement on Inpatient Glycemic Control (2013)  Target Ranges:  Prepandial:   less than 140 mg/dL      Peak postprandial:   less than 180 mg/dL (1-2 hours)      Critically ill patients:  140 - 180 mg/dL   Reason for Assessment: Admitted in DKA-- on insulin gtt.  ESRD  Diabetes history: Type 1 diabetes Outpatient Diabetes medications: Lantus 7 units bid, Novolog 6 units bid Current orders for Inpatient glycemic control: GlucoStabilizer  Note: When patient is transitioned from insulin gtt to subcutaneous injections, will need low dose Lantus given, then drip stopped 2 hours after Lantus dose, and correction given when drip stopped.  Home dose Lantus  7 units bid could be considered.  Would also benefit from Novolog sensitive correction scale.  Thank you.  Patti S. Marcelline Mates, RN, CNS, CDE Inpatient Diabetes Program, team pager [phone]

## 2013-11-24 NOTE — Progress Notes (Signed)
250cc un-opened  fentenyl wasted in room due to patient being on contact mrsa. Also 50cc wasted from fentenyl 250 bag. With waste witnessed by [employer]

## 2013-11-24 NOTE — Progress Notes (Signed)
eLink Physician-Brief Progress Note Patient Name: [name] DOB: May 24, 1971 MRN: [id number]  Date of Service  11/24/2013   HPI/Events of Note   Pt c/o headache.  eICU Interventions  Will order tylenol.   Intervention Category Major Interventions: Other:  Jola Critzer 11/24/2013, 8:25 PM

## 2013-11-24 NOTE — Progress Notes (Signed)
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 Presents to the ED complaining of weakness and dizziness. Found to be in severe DKA. Also severe hyperkalemia with wide QRS complexes. Intubated for airway protection. PCCM called to admit.   SIGNIFICANT EVENTS / STUDIES: 6/29 - Admitted 6/29 - Intubated, vented  LINES / TUBES: ETT 6/29 >>> Left femoral CVC 6/29 >>> R Thigh AV Fistula R HD Cath  CULTURES: Blood cultures 6/29 >>> Urine culture 6/29 >>> Tracheal aspirate MRSA by PCR 6/29 >>> pos  ANTIBIOTICS: Aztreonam (PCN allergic) 6/29 >>> Vancomycin 6/29 >>>  INTERVAL HISTORY: Critical K 2.9 6/29 PM, off Neo. No overnight events per nurse. Patient's gluc has been consistently < 250 since yesterday afternoon. Up to chair today, able to stand and walk a few steps.  VITAL SIGNS: Temp:  [97.6 F (36.4 C)-100.4 F (38 C)] 98.4 F (36.9 C) (06/30 0416) Pulse Rate:  [88-98] 88 (06/30 0600) Resp:  [16-39] 35 (06/30 0600) BP: (85-152)/(40-74) 141/60 mmHg (06/30 0600) SpO2:  [100 %] 100 % (06/30 0600) FiO2 (%):  [50 %] 50 % (06/30 0416) HEMODYNAMICS:   VENTILATOR SETTINGS: Vent Mode:  [-] PRVC FiO2 (%):  [50 %] 50 % Set Rate:  [35 bmp] 35 bmp Vt Set:  [370 mL] 370 mL PEEP:  [5 cmH20] 5 cmH20 Plateau Pressure:  [13 cmH20-20 cmH20] 20 cmH20  INTAKE / OUTPUT: Intake/Output     06/29 0701 - 06/30 0700 06/30 0701 - 07/01 0700   I.V. (mL/kg) 2254 (32.8)    NG/GT     IV Piggyback 150    Total Intake(mL/kg) 2404 (35)    Net +2404           PHYSICAL EXAMINATION: General: Well-developed AA female, sedated, intubated, NAD. Up to chair today. Neuro: Sedated and intubated. Follows commands. Coughing  frequently with ETT. Able to stand and support weight today, able to take a few steps. Up to chair. HEENT: PERRL, anicteric sclera. Oral mucosa pink and dry. ETT in place.  Heart: S1, S2. No m/g/r. No bruits, peripheral pulses present and equal. Lungs: On vent. Resps even and unlabored. Lungs clear anteriorly. Abdomen: Soft, non-distended, moderately tender to palpation. BS +. Surgical scar present. Musculoskeletal: No acute deformities noted. Pedal pulses present and equal bil. Slight LE edema. Skin: Warm, dry, intact. No rashes noted.   LABS:  CBC  Recent Labs Lab 11/23/13 0150 11/23/13 0332 11/23/13 0500  WBC 13.1*  --  12.7*  HGB 11.1* 13.6 9.8*  HCT 38.7 40.0 31.3*  PLT 249  --  191   Coag's No results found for this basename: APTT, INR,  in the last 168 hours BMET  Recent Labs Lab 11/23/13 2158 11/24/13 0101 11/24/13 0400  NA 135* 135* 135*  K 3.2* 3.7 3.5*  CL 90* 89* 90*  CO2 22 18* 20  BUN 49* 50* 51*  CREATININE 8.62* 8.91* 9.30*  GLUCOSE 97 243* 211*   Electrolytes  Recent Labs Lab 11/23/13 0325  11/23/13 0500  11/23/13 0750  11/23/13 2158 11/24/13 0101 11/24/13 0400  CALCIUM  --   < > 7.1*  < > 7.2*  < > 7.1* 7.0* 6.9*  MG 2.4  --   --   --   --   --   --   --   --  PHOS  --   --  11.9*  --  11.6*  --   --   --   --   < > = values in this interval not displayed. Sepsis Markers  Recent Labs Lab 11/23/13 0334 11/23/13 0458 11/23/13 1130  LATICACIDVEN 7.69* 10.0* 11.2*   ABG  Recent Labs Lab 11/23/13 0331 11/23/13 0551 11/23/13 0823  PHART 6.992* 7.007* 7.109*  7.082*  PCO2ART 33.7* 30.0* 32.0*  35.7  PO2ART 341.0* 145.0* 153.0*  55.0*   Liver Enzymes  Recent Labs Lab 11/23/13 0150 11/23/13 0500 11/23/13 0750  AST 71* 130*  --   ALT 23 43*  --   ALKPHOS 210* 176*  --   BILITOT 0.5 0.3  --   ALBUMIN 3.5 2.7* 2.8*   Cardiac Enzymes No results found for this basename: TROPONINI, PROBNP,  in the last 168  hours Glucose  Recent Labs Lab 11/24/13 0206 11/24/13 0315 11/24/13 0417 11/24/13 0517 11/24/13 0614 11/24/13 0712  GLUCAP 179* 178* 139* 205* 220* 149*    Imaging Dg Chest Portable 1 View  11/23/2013   CLINICAL DATA:  Repositioned endotracheal tube.  EXAM: PORTABLE CHEST - 1 VIEW  COMPARISON:  Chest radiograph November 23, 2013 at 0309 hr and chest radiograph June 27, 2013  FINDINGS: Retracted endotracheal tube, distal tip at the carina. Re-expanded left lung with patchy airspace opacities. No pleural effusions. No pneumothorax  Cardiac silhouette appears mildly enlarged, mediastinal silhouette is nonsuspicious. Multiple EKG lines overlie the patient and may obscure subtle underlying pathology. Soft tissue planes and included osseous structures are unchanged.  IMPRESSION: Retracted endotracheal tube, distal tip at the carina, recommend 2 cm of further retraction.  Re-expanded left lung with patchy airspace opacities, nonspecific.  Mild cardiomegaly.   Electronically Signed   By: Elon Alas   On: 11/23/2013 04:14   Dg Chest Portable 1 View  11/23/2013   CLINICAL DATA:  Emesis  EXAM: PORTABLE CHEST - 1 VIEW  COMPARISON:  06/27/2013  FINDINGS: Right mainstem intubation. The tube would need to be retracted 5 cm to reach the mid thoracic trachea. There is complete collapse of the left lung, with leftward shift of the mediastinum. The tube has been retracted at time of dictation. The heart size is obscured. The right lung is well aerated. No pneumothorax.  IMPRESSION: Right mainstem intubation and left lung collapse.   Electronically Signed   By: Jorje Guild M.D.   On: 11/23/2013 04:01   Dg Abd Portable 1v  11/23/2013   CLINICAL DATA:  Central line placement, emesis.  EXAM: PORTABLE ABDOMEN - 1 VIEW  COMPARISON:  None.  FINDINGS: Left femoral line in place, distal tip projecting over the sacrum.  Bowel gas pattern is nondilated and nonobstructive, with overall paucity of bowel gas. A  staple line in the in the right lower quadrant, surgical clips in the right upper quadrant, subcentimeter calcifications in the the left abdomen may are present nephrolithiasis. Soft tissue planes and included osseous structures are nonsuspicious.  : Left femoral line distal tip projects at the sacrum.  Nonspecific bowel gas pattern.   Electronically Signed   By: Elon Alas   On: 11/23/2013 04:13   CXR: none new  ASSESSMENT / PLAN:  PULMONARY A: 1) Acute respiratory failure due to inability to protect her airway 2) History of asthma (no active wheezing) P:   - No weaning until pH corrected, abg assessments needed -then plan wean cpap ps 5./5, goal 30 min   -  Albuterol PRN - ABG post HD - PCXR in am   CARDIOVASCULAR A:  1) Hemodynamically improved P:  - ICU monitoring - Neo used 6/29 pm for pressure support, off now  RENAL A:   1) ESRD on HD, last HD 6/27 2) Severe hyperkalemia 3) hyponatremia 4) Severe metabolic acidosis due to DKA ./ lactic - r/o transient ischemia P:   - HD today per nephrology - Reduce fluids (+2.6L 6/29) - BMP Q6 - Lactic resolving - Consider CT Abd/Pelv given increased lactic acid and hx of transplant - lactic clearing, I wonder if had transient ischemia  GASTROINTESTINAL A:   1) GERD, r/o transient resolving ischemia P:   - NPO on insulin drip, consider d/c drip - IV protonix - Monitor LFTs - CT Abd/Pelv with angio at mesenteric vessels  HEMATOLOGIC A:   1) leukoyctosis, macrocytosis - at risk myelodysplasia P:  - DVT prophylaxis with SQ heparin - [id number] [id number], Folic acid level needed   - Rpt CBC  INFECTIOUS A:   1) Elevated WBC, no clear source of infection, lactic acidosis P:   - Empiric Aztreonam -dc vanc - Follow cultures -CT  ENDOCRINE A:   1) Severe DKA / HONK in the setting of ESRD, AG 42 6/29 am P:   - DKA protocol maintained, may be able to dc as lactic accounts for AG - Insulin gtt per protocol, gluc consistently  < 250, consider d/c  NEUROLOGIC A:   1) AMS likely metabolic   P:   - RASS goal: 0 - Fentanyl gtt - Fent/versed PRN -mobilize, chair, walk on vent  Kimberly-Clark. Reese, PA-S  11/24/2013, 8:05 AM  Ccm time 30  Lavon Paganini. Titus Mould, MD, [place] Pgr: [facility]

## 2013-11-25 LAB — GLUCOSE, CAPILLARY
GLUCOSE-CAPILLARY: 114 mg/dL — AB (ref 70–99)
GLUCOSE-CAPILLARY: 146 mg/dL — AB (ref 70–99)
GLUCOSE-CAPILLARY: 194 mg/dL — AB (ref 70–99)
GLUCOSE-CAPILLARY: 201 mg/dL — AB (ref 70–99)
GLUCOSE-CAPILLARY: 277 mg/dL — AB (ref 70–99)
GLUCOSE-CAPILLARY: 299 mg/dL — AB (ref 70–99)
GLUCOSE-CAPILLARY: 78 mg/dL (ref 70–99)
GLUCOSE-CAPILLARY: 83 mg/dL (ref 70–99)
GLUCOSE-CAPILLARY: 97 mg/dL (ref 70–99)
Glucose-Capillary: 94 mg/dL (ref 70–99)
Glucose-Capillary: 175 mg/dL — ABNORMAL HIGH (ref 70–99)
Glucose-Capillary: 251 mg/dL — ABNORMAL HIGH (ref 70–99)
Glucose-Capillary: 270 mg/dL — ABNORMAL HIGH (ref 70–99)
Glucose-Capillary: 121 mg/dL — ABNORMAL HIGH (ref 70–99)
Glucose-Capillary: 79 mg/dL (ref 70–99)
Glucose-Capillary: 200 mg/dL — ABNORMAL HIGH (ref 70–99)
Glucose-Capillary: 28 mg/dL — CL (ref 70–99)

## 2013-11-25 LAB — BASIC METABOLIC PANEL
BUN: 15 mg/dL (ref 6–23)
BUN: 17 mg/dL (ref 6–23)
BUN: 24 mg/dL — ABNORMAL HIGH (ref 6–23)
BUN: 19 mg/dL (ref 6–23)
CALCIUM: 7.6 mg/dL — AB (ref 8.4–10.5)
CHLORIDE: 90 meq/L — AB (ref 96–112)
CHLORIDE: 95 meq/L — AB (ref 96–112)
CO2: 25 mEq/L (ref 19–32)
CO2: 24 mEq/L (ref 19–32)
CO2: 20 meq/L (ref 19–32)
CO2: 24 mEq/L (ref 19–32)
CREATININE: 4.4 mg/dL — AB (ref 0.50–1.10)
CREATININE: 5.53 mg/dL — AB (ref 0.50–1.10)
Calcium: 7.5 mg/dL — ABNORMAL LOW (ref 8.4–10.5)
Calcium: 7.6 mg/dL — ABNORMAL LOW (ref 8.4–10.5)
Calcium: 7.6 mg/dL — ABNORMAL LOW (ref 8.4–10.5)
Chloride: 93 mEq/L — ABNORMAL LOW (ref 96–112)
Chloride: 94 mEq/L — ABNORMAL LOW (ref 96–112)
Creatinine, Ser: 4.05 mg/dL — ABNORMAL HIGH (ref 0.50–1.10)
Creatinine, Ser: 5.16 mg/dL — ABNORMAL HIGH (ref 0.50–1.10)
GFR calc Af Amer: 15 mL/min — ABNORMAL LOW (ref >90)
GFR calc Af Amer: 13 mL/min — ABNORMAL LOW (ref >90)
GFR calc Af Amer: 11 mL/min — ABNORMAL LOW (ref >90)
GFR calc non Af Amer: 12 mL/min — ABNORMAL LOW (ref >90)
GFR calc non Af Amer: 9 mL/min — ABNORMAL LOW (ref >90)
GFR, EST AFRICAN AMERICAN: 10 mL/min — AB (ref >90)
GFR, EST NON AFRICAN AMERICAN: 13 mL/min — AB (ref >90)
GFR, EST NON AFRICAN AMERICAN: 9 mL/min — AB (ref >90)
GLUCOSE: 95 mg/dL (ref 70–99)
Glucose, Bld: 286 mg/dL — ABNORMAL HIGH (ref 70–99)
Glucose, Bld: 94 mg/dL (ref 70–99)
Glucose, Bld: 323 mg/dL — ABNORMAL HIGH (ref 70–99)
POTASSIUM: 3.9 meq/L (ref 3.7–5.3)
POTASSIUM: 3.8 meq/L (ref 3.7–5.3)
POTASSIUM: 4.6 meq/L (ref 3.7–5.3)
Potassium: 3.4 mEq/L — ABNORMAL LOW (ref 3.7–5.3)
SODIUM: 136 meq/L — AB (ref 137–147)
Sodium: 135 mEq/L — ABNORMAL LOW (ref 137–147)
Sodium: 132 mEq/L — ABNORMAL LOW (ref 137–147)
Sodium: 133 mEq/L — ABNORMAL LOW (ref 137–147)

## 2013-11-25 MED ORDER — FLUOXETINE HCL 20 MG PO CAPS
20.0000 mg | ORAL_CAPSULE | Freq: Every day | ORAL | Status: DC
  Administered 2013-11-25 – 2013-11-30 (×7): 20 mg via ORAL
  Filled 2013-11-25 (×9): qty 1

## 2013-11-25 MED ORDER — INSULIN ASPART 100 UNIT/ML ~~LOC~~ SOLN
0.0000 [IU] | Freq: Three times a day (TID) | SUBCUTANEOUS | Status: DC
  Administered 2013-11-25: 8 [IU] via SUBCUTANEOUS

## 2013-11-25 MED ORDER — OXYCODONE-ACETAMINOPHEN 5-325 MG PO TABS
1.0000 | ORAL_TABLET | ORAL | Status: DC | PRN
  Administered 2013-11-25 (×2): 2 via ORAL
  Administered 2013-11-25: 1 via ORAL
  Administered 2013-11-26 – 2013-11-27 (×5): 2 via ORAL
  Administered 2013-11-27: 1 via ORAL
  Administered 2013-11-29: 2 via ORAL
  Filled 2013-11-25 (×2): qty 1
  Filled 2013-11-25 (×7): qty 2
  Filled 2013-11-25: qty 1

## 2013-11-25 MED ORDER — GABAPENTIN 100 MG PO CAPS
100.0000 mg | ORAL_CAPSULE | Freq: Every day | ORAL | Status: DC
  Administered 2013-11-25 – 2013-12-01 (×7): 100 mg via ORAL
  Filled 2013-11-25 (×7): qty 1

## 2013-11-25 MED ORDER — INSULIN GLARGINE 100 UNIT/ML ~~LOC~~ SOLN
10.0000 [IU] | Freq: Every day | SUBCUTANEOUS | Status: DC
  Administered 2013-11-25: 10 [IU] via SUBCUTANEOUS
  Filled 2013-11-25 (×2): qty 0.1

## 2013-11-25 MED ORDER — DEXTROSE 50 % IV SOLN
50.0000 mL | Freq: Once | INTRAVENOUS | Status: AC | PRN
  Administered 2013-11-25: 50 mL via INTRAVENOUS
  Filled 2013-11-25: qty 50

## 2013-11-25 MED ORDER — SODIUM CHLORIDE 0.9 % IJ SOLN
10.0000 mL | INTRAMUSCULAR | Status: DC | PRN
  Administered 2013-11-25 (×2): 10 mL
  Administered 2013-11-27: 3 mL
  Administered 2013-11-27 – 2013-11-28 (×2): 30 mL
  Administered 2013-11-28 – 2013-11-29 (×3): 10 mL
  Administered 2013-11-30: 30 mL

## 2013-11-25 NOTE — Progress Notes (Signed)
11/25/13 1500  Clinical Encounter Type  Visited With Patient  Visit Type Initial;Spiritual support  Spiritual Encounters  Spiritual Needs Prayer  Stress Factors  Patient Stress Factors Family relationships  Chaplain made initial visit while making rounds. Pt was in lively spirits. Pt requested prayer for herself and family. Chaplain offered prayer and more conversation. Pt expressed gratitude for visit and asked Chaplain to return tomorrow. Delford Field 3:38 PM

## 2013-11-25 NOTE — Progress Notes (Signed)
BP trending down to 80s/40s with map in mid 50s while sleeping. MAP 69 when awake. Discussed vitals and Pt history with Dr Jamal Collin. No orders pending.

## 2013-11-25 NOTE — Progress Notes (Signed)
[facility] Progress Note   Subjective: Off of vent, alert, [place] O2, no complaints  Filed Vitals:   11/25/13 0900 11/25/13 1000 11/25/13 1100 11/25/13 1200  BP: 156/72 171/83 143/80 146/103  Pulse: 102 99 94 97  Temp: 98.6 F (37 C)     TempSrc: Oral     Resp: 17 17 15 9   Height:      Weight:      SpO2: 99% 100% 100% 100%   Exam: Alert, no distress No jvd Chest CTA bilat RRR 2/6 SEM Abd soft, dec'd BS, no ascites No LE edema, R thigh AVG patent Neuro sedated, nonresponsive No rash  HD: TTS NW F160 3h 19min   67.5kg  2/2.5 Bath   AVG R thigh    Heparin none Aranesp 60/wk on Tues   Hect 5ug TIW   Venofer 50 mg q Thurs   Assessment: 1 DKA / DM1- on insulin drip, better 2 Metabolic acidosis- improving 3 Vol excess- up 6kg today 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 6 Anemia cont aranesp, IV Fe 7 HTPH cont vit D  Plan- HD tomorrow, max UF as tolerated    Kelly Splinter MD  pager [phone]    cell [phone]  11/25/2013, 12:46 PM     Recent Labs Lab 11/23/13 0500  11/23/13 0750  11/24/13 2359 11/25/13 0307 11/25/13 1100  NA 128*  < > 127*  < > 135* 132* 136*  K 4.5  < > 4.0  < > 3.4* 3.9 3.8  CL 76*  < > 75*  < > 93* 90* 95*  CO2 11*  < > 10*  < > 25 20 24   GLUCOSE 1107*  < > 1105*  < > 95 286* 94  BUN 50*  < > 50*  < > 15 17 19   CREATININE 8.25*  < > 8.36*  < > 4.05* 4.40* 5.16*  CALCIUM 7.1*  < > 7.2*  < > 7.6* 7.5* 7.6*  PHOS 11.9*  --  11.6*  --   --   --   --   < > = values in this interval not displayed.  Recent Labs Lab 11/23/13 0150 11/23/13 0500 11/23/13 0750  AST 71* 130*  --   ALT 23 43*  --   ALKPHOS 210* 176*  --   BILITOT 0.5 0.3  --   PROT 7.9 6.3  --   ALBUMIN 3.5 2.7* 2.8*    Recent Labs Lab 11/23/13 0150 11/23/13 0332 11/23/13 0500  WBC 13.1*  --  12.7*  NEUTROABS 11.0*  --   --   HGB 11.1* 13.6 9.8*  HCT 38.7 40.0 31.3*  MCV 111.8*  --  103.6*  PLT 249  --  191   . antiseptic oral rinse  15 mL Mouth  Rinse QID  . aztreonam  1 g Intravenous Q24H  . chlorhexidine  15 mL Mouth Rinse BID  . Chlorhexidine Gluconate Cloth  6 each Topical Q0600  . darbepoetin (ARANESP) injection - DIALYSIS  60 mcg Intravenous Q Tue-HD  . doxercalciferol  5 mcg Intravenous Q T,Th,Sa-HD  . [START ON 11/26/2013] ferric gluconate (FERRLECIT/NULECIT) IV  62.5 mg Intravenous Q Thu-HD  . heparin  5,000 Units Subcutaneous 3 times per day  . insulin aspart  0-15 Units Subcutaneous TID WC  . insulin glargine  10 Units Subcutaneous Daily  . mupirocin ointment  1 application Nasal BID  . pantoprazole (PROTONIX) IV  40 mg Intravenous QHS   .  dextrose 5 % and 0.45% NaCl 50 mL/hr at 11/25/13 0318   sodium chloride, acetaminophen, dextrose, oxyCODONE-acetaminophen

## 2013-11-25 NOTE — Progress Notes (Signed)
Admission note:   Arrival Method: Transfer from 29M. Mental Status: A&Ox4.  Telemetry: Placed on box #9.  Skin: Intact.  Tubes: N/A IV: Left femoral TL central line. Pain: Denies.  Family: Pt is alone. Living Situation: From home. Safety Measures: Call bell within reach. 6E Orientation: Oriented to unit and surroundings.  Joellen Jersey, RN.

## 2013-11-25 NOTE — Progress Notes (Signed)
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 Presents to the ED complaining of weakness and dizziness. Found to be in severe DKA. Also severe hyperkalemia with wide QRS complexes. Intubated for airway protection. PCCM called to admit.   SIGNIFICANT EVENTS / STUDIES: 6/29 - Admitted 6/29 - Intubated, vented 6/30 Extubated  LINES / TUBES: ETT 6/29 >>> 6/30 Left femoral CVC 6/29 >>> R Thigh AV Fistula R HD Cath  CULTURES: Blood cultures 6/29 >>> ngtd >>> Urine culture 6/29 >>> Tracheal aspirate MRSA by PCR 6/29 >>> Pos  ANTIBIOTICS: Aztreonam (PCN allergic) 6/29 >>> Vancomycin 6/29 >>> 6/30  INTERVAL HISTORY: No overnight events per RN. Patient complaining of headache, denies diplopia, dizziness, or nausea. Issue with CT Abd/Pelv yesterday as pt does not have proper access for contrast.   VITAL SIGNS: Temp:  [98 F (36.7 C)-98.6 F (37 C)] 98.6 F (37 C) (07/01 0900) Pulse Rate:  [92-111] 103 (07/01 0800) Resp:  [12-25] 20 (07/01 0800) BP: (88-188)/(43-112) 140/67 mmHg (07/01 0800) SpO2:  [88 %-100 %] 100 % (07/01 0800) FiO2 (%):  [40 %] 40 % (06/30 1700) Weight:  [72.5 kg (159 lb 13.3 oz)-73.8 kg (162 lb 11.2 oz)] 73.8 kg (162 lb 11.2 oz) (07/01 0430) HEMODYNAMICS:   VENTILATOR SETTINGS: Vent Mode:  [-] PSV FiO2 (%):  [40 %] 40 % PEEP:  [5 cmH20] 5 cmH20 Pressure Support:  [5 cmH20] 5 cmH20  INTAKE / OUTPUT: Intake/Output     06/30 0701 - 07/01 0700 07/01 0701 - 07/02 0700   I.V. (mL/kg) 1278 (17.3) 50 (0.7)   NG/GT 900    IV Piggyback 50    Total Intake(mL/kg) 2228 (30.2) 50 (0.7)   Other 2000    Total Output 2000     Net +228 +50         PHYSICAL EXAMINATION: General:  Well-developed AA female, laying in bed, NAD. Neuro: Awake, alert and oriented x 4. Pleasant and cooperative with exam. Answering questions appropriately, no speech abnormalities, generalized weakness. HEENT: PERRL, anicteric sclera. Oral mucosa pink and dry.  Heart: S1, S2. No m/g/r. No bruits, peripheral pulses present and equal. Lungs: Resps even and unlabored.  Lungs CTAB. Abdomen: Soft, non-distended, moderately tender to palpation. BS +. Surgical scar present. Musculoskeletal: No acute deformities noted. Pedal pulses present and equal bil. Slight LE edema. Skin: Warm, dry, intact. No rashes noted.   LABS:  CBC  Recent Labs Lab 11/23/13 0150 11/23/13 0332 11/23/13 0500  WBC 13.1*  --  12.7*  HGB 11.1* 13.6 9.8*  HCT 38.7 40.0 31.3*  PLT 249  --  191   Coag's No results found for this basename: APTT, INR,  in the last 168 hours BMET  Recent Labs Lab 11/24/13 1700 11/24/13 2359 11/25/13 0307  NA 136* 135* 132*  K 4.0 3.4* 3.9  CL 93* 93* 90*  CO2 24 25 20   BUN 40* 15 17  CREATININE 7.64* 4.05* 4.40*  GLUCOSE 115* 95 286*   Electrolytes  Recent Labs Lab 11/23/13 0325  11/23/13 0500  11/23/13 0750  11/24/13 1700 11/24/13 2359 11/25/13 0307  CALCIUM  --   < > 7.1*  < > 7.2*  < > 6.9* 7.6* 7.5*  MG 2.4  --   --   --   --   --   --   --   --   PHOS  --   --  11.9*  --  11.6*  --   --   --   --   < > = values in this interval not displayed. Sepsis Markers  Recent Labs Lab 11/23/13 0458 11/23/13 1130 11/24/13 0850  LATICACIDVEN 10.0* 11.2* 3.1*   ABG  Recent Labs Lab 11/23/13 0551 11/23/13 0823 11/24/13 1517  PHART 7.007* 7.109*  7.082* 7.336*  PCO2ART 30.0* 32.0*  35.7 42.5  PO2ART 145.0* 153.0*  55.0* 158.0*   Liver Enzymes  Recent Labs Lab 11/23/13 0150 11/23/13 0500 11/23/13 0750  AST 71* 130*  --   ALT 23 43*  --   ALKPHOS 210* 176*  --   BILITOT 0.5 0.3  --   ALBUMIN 3.5 2.7* 2.8*   Cardiac Enzymes No results found for this  basename: TROPONINI, PROBNP,  in the last 168 hours Glucose  Recent Labs Lab 11/24/13 2302 11/25/13 0010 11/25/13 0113 11/25/13 0219 11/25/13 0313 11/25/13 0359  GLUCAP 94 83 175* 251* 270* 201*    Imaging No results found. CXR: none new  ASSESSMENT / PLAN:  PULMONARY A: 1) Acute respiratory failure due to inability to protect her airway 2) History of asthma (no active wheezing) P:   - Extubated 6/30, tolerated well - Supplemental O2 PRN for O2 sats > 93%, 4L [place] currently - Pulmonary hygiene - Rpt CXR post-ext  CARDIOVASCULAR A:  1) Hemodynamically improved P:  - ICU monitoring, consider dc  RENAL A:   1) ESRD on HD, last HD 6/27 2) Severe hyperkalemia 3) hyponatremia 4) Severe metabolic acidosis due to DKA ./ lactic - r/o transient ischemia P:   - HD per nephrology (usually T-Th-Sat) - Consider reduced IVF - BMP to am only - Lactic resolving - CT Abd/Pelvis not completed as pt does not have access, as outpt or pre dc recommend study mesenteric ischemia  GASTROINTESTINAL A:   1) GERD, r/o transient resolving ischemia P:   - NPO on insulin drip, consider d/c drip - IV protonix - CT Abd/Pelv with angio at mesenteric vessels, not done d/t lack of access - needs to be done at some stage  HEMATOLOGIC A:   1) leukoyctosis, macrocytosis - at risk myelodysplasia P:  - DVT prophylaxis with SQ heparin - 7/01 B12 1303   - Rpt CBC  INFECTIOUS A:   1) Elevated WBC, no clear source of infection, lactic acidosis P:   - Aztreonam, add stop date abdomen? - Follow cultures  ENDOCRINE A:   1) Severe DKA / HONK in the setting of ESRD, AG 42 6/29 am P:   - DKA protocol maintained, may be able to dc as lactic accounts for AG - Insulin gtt per protocol, gluc consistently < 250, consider d/c and add lantus, diet and ssi  NEUROLOGIC A:   1) AMS likely metabolic   P:   - RASS goal: 0 - Percocet PRN - Tylenol PRN   To floor, triad Lowella Dell. Reese,  PA-S  11/25/2013, 9:43 AM  I have fully examined this patient and agree with above findings.    And edited in full  Lavon Paganini. Titus Mould, MD, [place] Pgr: [facility]

## 2013-11-25 NOTE — Progress Notes (Signed)
Attempted to call report x1. RN at lunch. Number given to secretary RN will contact back.   Tamyia Minich R

## 2013-11-26 ENCOUNTER — Inpatient Hospital Stay (HOSPITAL_COMMUNITY): Payer: Medicare Other

## 2013-11-26 ENCOUNTER — Encounter (HOSPITAL_COMMUNITY): Payer: Self-pay | Admitting: Radiology

## 2013-11-26 DIAGNOSIS — N058 Unspecified nephritic syndrome with other morphologic changes: Secondary | ICD-10-CM

## 2013-11-26 DIAGNOSIS — E1029 Type 1 diabetes mellitus with other diabetic kidney complication: Secondary | ICD-10-CM

## 2013-11-26 DIAGNOSIS — J189 Pneumonia, unspecified organism: Secondary | ICD-10-CM

## 2013-11-26 LAB — CBC WITH DIFFERENTIAL/PLATELET
BASOS ABS: 0 10*3/uL (ref 0.0–0.1)
BASOS PCT: 0 % (ref 0–1)
Eosinophils Absolute: 0.2 10*3/uL (ref 0.0–0.7)
Eosinophils Relative: 3 % (ref 0–5)
HCT: 26.6 % — ABNORMAL LOW (ref 36.0–46.0)
Hemoglobin: 8.7 g/dL — ABNORMAL LOW (ref 12.0–15.0)
Lymphocytes Relative: 19 % (ref 12–46)
Lymphs Abs: 1.3 10*3/uL (ref 0.7–4.0)
MCH: 31.6 pg (ref 26.0–34.0)
MCHC: 32.7 g/dL (ref 30.0–36.0)
MCV: 96.7 fL (ref 78.0–100.0)
MONOS PCT: 9 % (ref 3–12)
Monocytes Absolute: 0.6 10*3/uL (ref 0.1–1.0)
NEUTROS ABS: 4.8 10*3/uL (ref 1.7–7.7)
Neutrophils Relative %: 69 % (ref 43–77)
Platelets: 128 10*3/uL — ABNORMAL LOW (ref 150–400)
RBC: 2.75 MIL/uL — ABNORMAL LOW (ref 3.87–5.11)
RDW: 15.1 % (ref 11.5–15.5)
WBC: 6.9 10*3/uL (ref 4.0–10.5)

## 2013-11-26 LAB — BASIC METABOLIC PANEL
ANION GAP: 16 — AB (ref 5–15)
BUN: 26 mg/dL — ABNORMAL HIGH (ref 6–23)
CHLORIDE: 93 meq/L — AB (ref 96–112)
CO2: 26 mEq/L (ref 19–32)
CREATININE: 6.07 mg/dL — AB (ref 0.50–1.10)
Calcium: 8 mg/dL — ABNORMAL LOW (ref 8.4–10.5)
GFR calc non Af Amer: 8 mL/min — ABNORMAL LOW (ref >90)
GFR, EST AFRICAN AMERICAN: 9 mL/min — AB (ref >90)
Glucose, Bld: 33 mg/dL — CL (ref 70–99)
Potassium: 3.9 mEq/L (ref 3.7–5.3)
SODIUM: 135 meq/L — AB (ref 137–147)

## 2013-11-26 LAB — GLUCOSE, CAPILLARY
GLUCOSE-CAPILLARY: 149 mg/dL — AB (ref 70–99)
GLUCOSE-CAPILLARY: 184 mg/dL — AB (ref 70–99)
GLUCOSE-CAPILLARY: 94 mg/dL (ref 70–99)
Glucose-Capillary: 49 mg/dL — ABNORMAL LOW (ref 70–99)
Glucose-Capillary: 123 mg/dL — ABNORMAL HIGH (ref 70–99)
Glucose-Capillary: 280 mg/dL — ABNORMAL HIGH (ref 70–99)
Glucose-Capillary: 137 mg/dL — ABNORMAL HIGH (ref 70–99)
Glucose-Capillary: 34 mg/dL — CL (ref 70–99)

## 2013-11-26 MED ORDER — METOPROLOL TARTRATE 100 MG PO TABS
100.0000 mg | ORAL_TABLET | Freq: Once | ORAL | Status: AC
  Administered 2013-11-26: 100 mg via ORAL
  Filled 2013-11-26: qty 1

## 2013-11-26 MED ORDER — IOHEXOL 300 MG/ML  SOLN
80.0000 mL | Freq: Once | INTRAMUSCULAR | Status: AC | PRN
  Administered 2013-11-26: 80 mL via INTRAVENOUS

## 2013-11-26 MED ORDER — IOHEXOL 300 MG/ML  SOLN
25.0000 mL | INTRAMUSCULAR | Status: AC
  Administered 2013-11-26: 25 mL via ORAL

## 2013-11-26 MED ORDER — ALTEPLASE 2 MG IJ SOLR
2.0000 mg | Freq: Once | INTRAMUSCULAR | Status: DC | PRN
  Filled 2013-11-26: qty 2

## 2013-11-26 MED ORDER — PENTAFLUOROPROP-TETRAFLUOROETH EX AERO
INHALATION_SPRAY | CUTANEOUS | Status: AC
  Filled 2013-11-26: qty 103.5

## 2013-11-26 MED ORDER — ASPIRIN EC 81 MG PO TBEC
81.0000 mg | DELAYED_RELEASE_TABLET | Freq: Every day | ORAL | Status: DC
  Administered 2013-11-26 – 2013-12-01 (×6): 81 mg via ORAL
  Filled 2013-11-26 (×6): qty 1

## 2013-11-26 MED ORDER — HYDROXYZINE HCL 25 MG PO TABS
ORAL_TABLET | ORAL | Status: AC
  Administered 2013-11-26: 25 mg via ORAL
  Filled 2013-11-26: qty 1

## 2013-11-26 MED ORDER — CLONAZEPAM 0.5 MG PO TABS
0.5000 mg | ORAL_TABLET | Freq: Every day | ORAL | Status: DC
  Administered 2013-11-26 – 2013-11-30 (×5): 0.5 mg via ORAL
  Filled 2013-11-26 (×5): qty 1

## 2013-11-26 MED ORDER — INSULIN GLARGINE 100 UNIT/ML ~~LOC~~ SOLN
5.0000 [IU] | Freq: Every day | SUBCUTANEOUS | Status: DC
  Administered 2013-11-26: 5 [IU] via SUBCUTANEOUS
  Filled 2013-11-26 (×2): qty 0.05

## 2013-11-26 MED ORDER — METOPROLOL SUCCINATE ER 100 MG PO TB24
100.0000 mg | ORAL_TABLET | Freq: Every evening | ORAL | Status: DC
  Filled 2013-11-26: qty 1

## 2013-11-26 MED ORDER — SODIUM CHLORIDE 0.9 % IV SOLN: 100.0000 mL | INTRAVENOUS | Status: DC | PRN

## 2013-11-26 MED ORDER — NEPRO/CARBSTEADY PO LIQD: 237.0000 mL | ORAL | Status: DC | PRN

## 2013-11-26 MED ORDER — LIDOCAINE-PRILOCAINE 2.5-2.5 % EX CREA: 1.0000 "application " | TOPICAL_CREAM | CUTANEOUS | Status: DC | PRN

## 2013-11-26 MED ORDER — LIDOCAINE HCL (PF) 1 % IJ SOLN: 5.0000 mL | INTRAMUSCULAR | Status: DC | PRN

## 2013-11-26 MED ORDER — DIPHENHYDRAMINE HCL 50 MG/ML IJ SOLN
25.0000 mg | Freq: Once | INTRAMUSCULAR | Status: AC
  Administered 2013-11-26: 25 mg via INTRAVENOUS

## 2013-11-26 MED ORDER — METOPROLOL SUCCINATE ER 50 MG PO TB24
50.0000 mg | ORAL_TABLET | Freq: Every evening | ORAL | Status: DC
  Administered 2013-11-26: 50 mg via ORAL
  Filled 2013-11-26 (×2): qty 1

## 2013-11-26 MED ORDER — DIPHENHYDRAMINE HCL 50 MG/ML IJ SOLN
INTRAMUSCULAR | Status: AC
  Filled 2013-11-26: qty 1

## 2013-11-26 MED ORDER — LOPERAMIDE HCL 2 MG PO CAPS
2.0000 mg | ORAL_CAPSULE | ORAL | Status: DC | PRN
  Administered 2013-11-26 – 2013-11-27 (×3): 2 mg via ORAL
  Filled 2013-11-26 (×3): qty 1

## 2013-11-26 MED ORDER — MONTELUKAST SODIUM 10 MG PO TABS
10.0000 mg | ORAL_TABLET | Freq: Every day | ORAL | Status: DC
  Administered 2013-11-26 – 2013-11-30 (×5): 10 mg via ORAL
  Filled 2013-11-26 (×7): qty 1

## 2013-11-26 MED ORDER — PANTOPRAZOLE SODIUM 40 MG PO TBEC
40.0000 mg | DELAYED_RELEASE_TABLET | Freq: Every day | ORAL | Status: DC
  Administered 2013-11-26 – 2013-12-01 (×6): 40 mg via ORAL
  Filled 2013-11-26 (×2): qty 1

## 2013-11-26 MED ORDER — INSULIN ASPART 100 UNIT/ML ~~LOC~~ SOLN
0.0000 [IU] | Freq: Three times a day (TID) | SUBCUTANEOUS | Status: DC
  Administered 2013-11-26: 3 [IU] via SUBCUTANEOUS

## 2013-11-26 MED ORDER — DEXTROSE 50 % IV SOLN
50.0000 mL | Freq: Once | INTRAVENOUS | Status: AC | PRN
  Administered 2013-11-26: 50 mL via INTRAVENOUS

## 2013-11-26 MED ORDER — CALCIUM ACETATE 667 MG PO CAPS
2001.0000 mg | ORAL_CAPSULE | Freq: Three times a day (TID) | ORAL | Status: DC
  Administered 2013-11-26 – 2013-12-01 (×14): 2001 mg via ORAL
  Filled 2013-11-26 (×17): qty 3

## 2013-11-26 MED ORDER — ALBUTEROL SULFATE (2.5 MG/3ML) 0.083% IN NEBU: 2.5000 mg | INHALATION_SOLUTION | RESPIRATORY_TRACT | Status: DC | PRN

## 2013-11-26 MED ORDER — HEPARIN SODIUM (PORCINE) 1000 UNIT/ML DIALYSIS: 1000.0000 [IU] | INTRAMUSCULAR | Status: DC | PRN

## 2013-11-26 MED ORDER — PENTAFLUOROPROP-TETRAFLUOROETH EX AERO: 1.0000 "application " | INHALATION_SPRAY | CUTANEOUS | Status: DC | PRN

## 2013-11-26 MED ORDER — HYDROXYZINE HCL 25 MG PO TABS
25.0000 mg | ORAL_TABLET | Freq: Four times a day (QID) | ORAL | Status: DC | PRN
  Administered 2013-11-26: 25 mg via ORAL

## 2013-11-26 MED ORDER — DARBEPOETIN ALFA-POLYSORBATE 100 MCG/0.5ML IJ SOLN
100.0000 ug | INTRAMUSCULAR | Status: DC
  Filled 2013-11-26: qty 0.5

## 2013-11-26 MED ORDER — DOXERCALCIFEROL 4 MCG/2ML IV SOLN
INTRAVENOUS | Status: AC
  Administered 2013-11-26: 5 ug via INTRAVENOUS
  Filled 2013-11-26: qty 4

## 2013-11-26 MED ORDER — NEPRO/CARBSTEADY PO LIQD
237.0000 mL | Freq: Two times a day (BID) | ORAL | Status: DC
  Administered 2013-11-26 – 2013-11-30 (×8): 237 mL via ORAL
  Filled 2013-11-26: qty 237

## 2013-11-26 NOTE — Progress Notes (Signed)
Hypoglycemic Event  CBG: 49  Treatment: D50 IV 50 mL  Symptoms: headache  Follow-up CBG: Time:0354 CBG Result:123  Possible Reasons for Event: Unknown  Comments/MD notified:Patient states dextrose will keep fluctuating blood sugars. Patient encouraged to eat some snacks.    [name], [name]  Remember to initiate Hypoglycemia Order Set & complete

## 2013-11-26 NOTE — Progress Notes (Signed)
Hypoglycemic Event  CBG: 28  Treatment: D50 IV 50 mL  Symptoms: None  Follow-up CBG: Time:2142 CBG Result:146  Possible Reasons for Event: Inadequate meal intake  Comments/MD notified: prn Dextrose already ordered. Patient taking snacks as well, will continue to monitor.    [name], [name]  Remember to initiate Hypoglycemia Order Set & complete

## 2013-11-26 NOTE — Progress Notes (Signed)
[facility] Progress Note  Subjective:  On HD. Complaints of itching. Hypoglycemic events overnight.  Objective Filed Vitals:   11/26/13 1030 11/26/13 1100 11/26/13 1130 11/26/13 1200  BP: 149/73 117/71 137/90   Pulse: 90 91 93 89  Temp:      TempSrc:      Resp: 14 23 14 13   Height:      Weight:      SpO2:       Physical Exam General: Alert, NAD Heart: RRR +murmur Lungs: CTA bilat, no wheezes/rhonchi Abdomen:Soft, NT, + BS Extremities: +1 pitting LE edema Dialysis Access: Rt thigh AVG patent  HD: TTS NW  F160 3h 64min 67.5kg 2/2.5 Bath AVG R thigh Heparin none  Aranesp 60/wk on Tues Hect 5ug TIW Venofer 50 mg q Thurs  Assessment/Plan: 1. DKA/ DM1 - Mgmt per primary. On insulin. 2. ESRD - TTS, K+ 3.9, HD today 3. Anemia - Hgb 8.7 < 9.8. Increase Aranesp to 100 q Tues. Last Tsat 37% on 6/25 outpatient. Follow CBC 4. Secondary hyperparathyroidism - Ca 8.0 (~9 corrected.) Phos up. Home Phoslo resumed. Last PTH 374. Continue Vit D. 5. HTN/volume - SBPs 120s on HD. Up 7kg by standing wgts. Tolerating UF goal 5.5L today.  6. Nutrition - Renal diet, multivit 7. Pruritus - Phos extremely high. Binders + PRN hydroxyzine. 8. Hx failed KP transplant   Collene Leyden. Rhodia Albright [facility] Pager [phone] 11/26/2013,12:30 PM  LOS: 3 days   Pt seen, examined and agree w A/P as above.  Kelly Splinter MD pager [phone]    cell [phone] 11/26/2013, 12:39 PM    Additional Objective Labs: Basic Metabolic Panel:  Recent Labs Lab 11/23/13 0500  11/23/13 0750  11/25/13 1100 11/25/13 1700 11/26/13 0205  NA 128*  < > 127*  < > 136* 133* 135*  K 4.5  < > 4.0  < > 3.8 4.6 3.9  CL 76*  < > 75*  < > 95* 94* 93*  CO2 11*  < > 10*  < > 24 24 26   GLUCOSE 1107*  < > 1105*  < > 94 323* 33*  BUN 50*  < > 50*  < > 19 24* 26*  CREATININE 8.25*  < > 8.36*  < > 5.16* 5.53* 6.07*  CALCIUM 7.1*  < > 7.2*  < > 7.6* 7.6* 8.0*  PHOS 11.9*  --  11.6*  --   --   --    --   < > = values in this interval not displayed. Liver Function Tests:  Recent Labs Lab 11/23/13 0150 11/23/13 0500 11/23/13 0750  AST 71* 130*  --   ALT 23 43*  --   ALKPHOS 210* 176*  --   BILITOT 0.5 0.3  --   PROT 7.9 6.3  --   ALBUMIN 3.5 2.7* 2.8*    Recent Labs Lab 11/23/13 0437  LIPASE 34  AMYLASE 273*   CBC:  Recent Labs Lab 11/23/13 0150 11/23/13 0332 11/23/13 0500 11/26/13 0205  WBC 13.1*  --  12.7* 6.9  NEUTROABS 11.0*  --   --  4.8  HGB 11.1* 13.6 9.8* 8.7*  HCT 38.7 40.0 31.3* 26.6*  MCV 111.8*  --  103.6* 96.7  PLT 249  --  191 128*   Blood Culture    Component Value Date/Time   SDES BLOOD RIGHT HAND 11/23/2013 0722   SPECREQUEST BOTTLES DRAWN AEROBIC AND ANAEROBIC 10CC BLUE, 6 CC RED 11/23/2013 0722   CULT  Value:        BLOOD CULTURE RECEIVED NO GROWTH TO DATE CULTURE WILL BE HELD FOR 5 DAYS BEFORE ISSUING A FINAL NEGATIVE REPORT Performed at [facility] 11/23/2013 0722   REPTSTATUS PENDING 11/23/2013 0722    CBG:  Recent Labs Lab 11/25/13 2110 11/25/13 2142 11/26/13 0324 11/26/13 0354 11/26/13 0811  GLUCAP 28* 146* 49* 123* 184*   Studies/Results: No results found. Medications:   . antiseptic oral rinse  15 mL Mouth Rinse QID  . aspirin EC  81 mg Oral Daily  . aztreonam  1 g Intravenous Q24H  . calcium acetate  2,001 mg Oral TID WC  . chlorhexidine  15 mL Mouth Rinse BID  . Chlorhexidine Gluconate Cloth  6 each Topical Q0600  . clonazePAM  0.5 mg Oral QHS  . [START ON 12/01/2013] darbepoetin (ARANESP) injection - DIALYSIS  100 mcg Intravenous Q Tue-HD  . doxercalciferol  5 mcg Intravenous Q T,Th,Sa-HD  . feeding supplement (NEPRO CARB STEADY)  237 mL Oral BID BM  . ferric gluconate (FERRLECIT/NULECIT) IV  62.5 mg Intravenous Q Thu-HD  . FLUoxetine  20 mg Oral QHS  . gabapentin  100 mg Oral Daily  . heparin  5,000 Units Subcutaneous 3 times per day  . insulin aspart  0-5 Units Subcutaneous TID WC  . insulin glargine  5  Units Subcutaneous Daily  . metoprolol tartrate  100 mg Oral Once  . metoprolol succinate  100 mg Oral QPM  . montelukast  10 mg Oral QHS  . mupirocin ointment  1 application Nasal BID  . pantoprazole  40 mg Oral Daily

## 2013-11-26 NOTE — Progress Notes (Signed)
[facility] PROGRESS NOTE  MINERVIA OSSO GUR:[id number] DOB: Nov 25, 1970 DOA: 11/23/2013 PCP: [name], MD  Assessment/Plan  DKA with T1DM, patient appears to have low total daily insulin requirements based on her insulin drip and she had multiple episodes of hypoglycemia after being transitioned to subcutaneous insulin. -  Decrease Lantus to 5 units -  Question dose sliding scale insulin 0-5 units, not to receive any insulin unless CBG greater than 150 -  A1c   Acute respiratory failure due to CAP, DKA s/p intubation on 6/29 and extubation on 6/30  Abdominal pain, given DM and HTN, at risk for ischemia, although pain not worse with eating -  Ct angio abd/pelvis  Hyperkalemia with wide-QRS complex, resolved with HD -  Telemetry:  NSR, okay to d/c telemetry   HTN, blood pressure elevated -  Resume metoprolol at 50mg    ESRD, Tues, Thur, Sat schedule -  Per nephrology  Normocytic anemia -  Iron studies, B12, folate, TSH -  Occult stool  Asthma, stable, continue albuterol prn  Leukocytosis, possible CAP with patchy opacities seen on CXR.  UA not obtained initially and likely to be negative after 4 days of broad spectrum abx -  BCx NGTD -  D/c vancomycin -  Continue aztreonam and transition to levofloxacin at discharge  Metabolic encephalopathy due electrolyte disturbance/hyperkalemia, DKA, resolved.  Diet:  Diabetic/renal Access:  Left femoral CVC IVF: None Proph:  heparin  Code Status: full Family Communication: patient alone Disposition Plan: pending improvement in CBG   Consultants:  Nephrology  PCCM  Procedures:  6/29 - intubated  6/30 - extubated  Left femoral CVC 6/29  Antibiotics: Aztreonam (PCN allergic) 6/29 >>>  Vancomycin 6/29 >>> 6/30   HPI/Subjective:  Still having diffuse abdominal pain, cough, denies SOB.    Objective: Filed Vitals:   11/26/13 1130 11/26/13 1200 11/26/13 1230 11/26/13 1252  BP: 137/90 104/68  107/72 107/64  Pulse: 93 89 92 91  Temp:    97.6 F (36.4 C)  TempSrc:    Oral  Resp: 14 13 19 21   Height:      Weight:    67.9 kg (149 lb 11.1 oz)  SpO2:    100%    Intake/Output Summary (Last 24 hours) at 11/26/13 1449 Last data filed at 11/26/13 1252  Gross per 24 hour  Intake   1550 ml  Output   4532 ml  Net  -2982 ml   Filed Weights   11/26/13 0355 11/26/13 0834 11/26/13 1252  Weight: 74.617 kg (164 lb 8 oz) 74.4 kg (164 lb 0.4 oz) 67.9 kg (149 lb 11.1 oz)    Exam:   General:  BF, No acute distress  HEENT:  NCAT, MMM  Cardiovascular:  RRR, nl S1, S2 no mrg, 2+ pulses, warm extremities  Respiratory:  Rales at right base, no wheezes or rhonchi, no increased WOB  Abdomen:   NABS, soft, NT/ND  MSK:   Normal tone and bulk, no LEE  Neuro:  Grossly intact  Data Reviewed: Basic Metabolic Panel:  Recent Labs Lab 11/23/13 0325  11/23/13 0500  11/23/13 0750  11/24/13 2359 11/25/13 0307 11/25/13 1100 11/25/13 1700 11/26/13 0205  NA  --   < > 128*  < > 127*  < > 135* 132* 136* 133* 135*  K  --   < > 4.5  < > 4.0  < > 3.4* 3.9 3.8 4.6 3.9  CL  --   < > 76*  < > 75*  < >  93* 90* 95* 94* 93*  CO2  --   < > 11*  < > 10*  < > 25 20 24 24 26   GLUCOSE  --   < > 1107*  < > 1105*  < > 95 286* 94 323* 33*  BUN  --   < > 50*  < > 50*  < > 15 17 19  24* 26*  CREATININE  --   < > 8.25*  < > 8.36*  < > 4.05* 4.40* 5.16* 5.53* 6.07*  CALCIUM  --   < > 7.1*  < > 7.2*  < > 7.6* 7.5* 7.6* 7.6* 8.0*  MG 2.4  --   --   --   --   --   --   --   --   --   --   PHOS  --   --  11.9*  --  11.6*  --   --   --   --   --   --   < > = values in this interval not displayed. Liver Function Tests:  Recent Labs Lab 11/23/13 0150 11/23/13 0500 11/23/13 0750  AST 71* 130*  --   ALT 23 43*  --   ALKPHOS 210* 176*  --   BILITOT 0.5 0.3  --   PROT 7.9 6.3  --   ALBUMIN 3.5 2.7* 2.8*    Recent Labs Lab 11/23/13 0437  LIPASE 34  AMYLASE 273*   No results found for this basename:  AMMONIA,  in the last 168 hours CBC:  Recent Labs Lab 11/23/13 0150 11/23/13 0332 11/23/13 0500 11/26/13 0205  WBC 13.1*  --  12.7* 6.9  NEUTROABS 11.0*  --   --  4.8  HGB 11.1* 13.6 9.8* 8.7*  HCT 38.7 40.0 31.3* 26.6*  MCV 111.8*  --  103.6* 96.7  PLT 249  --  191 128*   Cardiac Enzymes: No results found for this basename: CKTOTAL, CKMB, CKMBINDEX, TROPONINI,  in the last 168 hours BNP (last 3 results)  Recent Labs  05/03/13 0653 06/27/13 1510  PROBNP 33341.0* 5305.0*   CBG:  Recent Labs Lab 11/25/13 2142 11/26/13 0324 11/26/13 0354 11/26/13 0811 11/26/13 1435  GLUCAP 146* 49* 123* 184* 149*    Recent Results (from the past 240 hour(s))  CULTURE, BLOOD (ROUTINE X 2)     Status: None   Collection Time    11/23/13  3:35 AM      Result Value Ref Range Status   Specimen Description BLOOD LEFT FEMORAL ARTERY CENTRAL LINE   Final   Special Requests BOTTLES DRAWN AEROBIC ONLY 10CC   Final   Culture  Setup Time     Final   Value: 11/23/2013 08:21     Performed at [facility]   Culture     Final   Value:        BLOOD CULTURE RECEIVED NO GROWTH TO DATE CULTURE WILL BE HELD FOR 5 DAYS BEFORE ISSUING A FINAL NEGATIVE REPORT     Performed at [facility]   Report Status PENDING   Incomplete  CULTURE, BLOOD (ROUTINE X 2)     Status: None   Collection Time    11/23/13  3:45 AM      Result Value Ref Range Status   Specimen Description BLOOD LEFT ARM   Final   Special Requests BOTTLES DRAWN AEROBIC AND ANAEROBIC [place] 3CC RED   Final   Culture  Setup Time  Final   Value: 11/23/2013 08:21     Performed at [facility]   Culture     Final   Value:        BLOOD CULTURE RECEIVED NO GROWTH TO DATE CULTURE WILL BE HELD FOR 5 DAYS BEFORE ISSUING A FINAL NEGATIVE REPORT     Performed at [facility]   Report Status PENDING   Incomplete  MRSA PCR SCREENING     Status: Abnormal   Collection Time    11/23/13  5:49 AM      Result Value Ref  Range Status   MRSA by PCR POSITIVE (*) NEGATIVE Final   Comment:            The GeneXpert MRSA Assay (FDA     approved for NASAL specimens     only), is one component of a     comprehensive MRSA colonization     surveillance program. It is not     intended to diagnose MRSA     infection nor to guide or     monitor treatment for     MRSA infections.     RESULT CALLED TO, READ BACK BY AND VERIFIED WITH:     CHRIS HAYES,RN [id number] 11/23/13 BY KBARR  CULTURE, BLOOD (ROUTINE X 2)     Status: None   Collection Time    11/23/13  7:00 AM      Result Value Ref Range Status   Specimen Description BLOOD LEFT HAND   Final   Special Requests     Final   Value: BOTTLES DRAWN AEROBIC AND ANAEROBIC 10CC BLUE, 8CC RED   Culture  Setup Time     Final   Value: 11/23/2013 12:20     Performed at [facility]   Culture     Final   Value:        BLOOD CULTURE RECEIVED NO GROWTH TO DATE CULTURE WILL BE HELD FOR 5 DAYS BEFORE ISSUING A FINAL NEGATIVE REPORT     Performed at [facility]   Report Status PENDING   Incomplete  CULTURE, BLOOD (ROUTINE X 2)     Status: None   Collection Time    11/23/13  7:22 AM      Result Value Ref Range Status   Specimen Description BLOOD RIGHT HAND   Final   Special Requests     Final   Value: BOTTLES DRAWN AEROBIC AND ANAEROBIC 10CC BLUE, 6 CC RED   Culture  Setup Time     Final   Value: 11/23/2013 12:20     Performed at [facility]   Culture     Final   Value:        BLOOD CULTURE RECEIVED NO GROWTH TO DATE CULTURE WILL BE HELD FOR 5 DAYS BEFORE ISSUING A FINAL NEGATIVE REPORT     Performed at [facility]   Report Status PENDING   Incomplete     Studies: No results found.  Scheduled Meds: . antiseptic oral rinse  15 mL Mouth Rinse QID  . aspirin EC  81 mg Oral Daily  . aztreonam  1 g Intravenous Q24H  . calcium acetate  2,001 mg Oral TID WC  . chlorhexidine  15 mL Mouth Rinse BID  . Chlorhexidine Gluconate Cloth  6 each  Topical Q0600  . clonazePAM  0.5 mg Oral QHS  . [START ON 12/01/2013] darbepoetin (ARANESP) injection - DIALYSIS  100 mcg Intravenous Q Tue-HD  .  doxercalciferol  5 mcg Intravenous Q T,Th,Sa-HD  . feeding supplement (NEPRO CARB STEADY)  237 mL Oral BID BM  . ferric gluconate (FERRLECIT/NULECIT) IV  62.5 mg Intravenous Q Thu-HD  . FLUoxetine  20 mg Oral QHS  . gabapentin  100 mg Oral Daily  . heparin  5,000 Units Subcutaneous 3 times per day  . insulin aspart  0-5 Units Subcutaneous TID WC  . insulin glargine  5 Units Subcutaneous Daily  . metoprolol succinate  100 mg Oral QPM  . montelukast  10 mg Oral QHS  . mupirocin ointment  1 application Nasal BID  . pantoprazole  40 mg Oral Daily  . pentafluoroprop-tetrafluoroeth       Continuous Infusions:   Active Problems:   DKA (diabetic ketoacidoses)   Acute respiratory failure with hypoxia    Time spent: 30 min    Shakiyla Kook, [facility] Pager [phone]. If 7PM-7AM, please contact night-coverage at [email or web address], password [facility] 11/26/2013, 2:49 PM  LOS: 3 days

## 2013-11-26 NOTE — Progress Notes (Signed)
Inpatient Diabetes Program Recommendations  AACE/ADA: New Consensus Statement on Inpatient Glycemic Control (2013)  Target Ranges:  Prepandial:   less than 140 mg/dL      Peak postprandial:   less than 180 mg/dL (1-2 hours)      Critically ill patients:  140 - 180 mg/dL     Results for [name], [name] (MRN [id number]) as of 11/26/2013 09:09  Ref. Range 11/25/2013 12:11 11/25/2013 15:01 11/25/2013 16:37 11/25/2013 21:10 11/25/2013 21:42  Glucose-Capillary Latest Range: 70-99 mg/dL 78 277 (H) 299 (H) 28 (LL) 146 (H)    Results for [name], [name] (MRN [id number]) as of 11/26/2013 09:09  Ref. Range 11/26/2013 03:24 11/26/2013 03:54 11/26/2013 08:11  Glucose-Capillary Latest Range: 70-99 mg/dL 49 (L) 123 (H) 184 (H)     **Patient transitioned off IV insulin yesterday.  Patient given 10 units Lantus at 1pm.  Patient's CBG at 4pm was 299 mg/dl and patient received 8 units Novolog per SSI.  Patient then had Hypoglycemia at bedtime that was corrected quickly with IV D50%.  **Note MD has decreased Lantus to 5 units daily.  Novolog SSI also adjusted so that patient won't get insulin coverage until CBG 151 mg/dl.  Agree.    Will follow Wyn Quaker RN, MSN, CDE Diabetes Coordinator Inpatient Diabetes Program Team Pager: [phone] (8a-10p)

## 2013-11-26 NOTE — Progress Notes (Signed)
ANTIBIOTIC CONSULT NOTE - FOLLOW UP  Pharmacy Consult for aztreonam Indication: empiric  Allergies  Allergen Reactions  . Depakote [Divalproex Sodium] Other (See Comments)    Pt gets "delirious"  . Morphine And Related Nausea And Vomiting and Other (See Comments)    "makes me delirious"  . Penicillins Anaphylaxis    Tolerated Zosyn Dec 2014  . Tramadol Nausea And Vomiting  . Imitrex [Sumatriptan] Other (See Comments)    seizures  . Vicodin [Hydrocodone-Acetaminophen] Itching and Rash    Patient Measurements: Height: 5' 1.02" (155 cm) Weight: 164 lb 0.4 oz (74.4 kg) IBW/kg (Calculated) : 47.86   Vital Signs: Temp: 97.6 F (36.4 C) (07/02 0834) Temp src: Oral (07/02 0834) BP: 117/71 mmHg (07/02 1100) Pulse Rate: 91 (07/02 1100) Intake/Output from previous day: 07/01 0701 - 07/02 0700 In: 1806.1 [P.O.:600; I.V.:1156.1; IV Piggyback:50] Out: -  Intake/Output from this shift:    Labs:  Recent Labs  11/25/13 1100 11/25/13 1700 11/26/13 0205  WBC  --   --  6.9  HGB  --   --  8.7*  PLT  --   --  128*  CREATININE 5.16* 5.53* 6.07*   Estimated Creatinine Clearance: 11.3 ml/min (by C-G formula based on Cr of 6.07). No results found for this basename: VANCOTROUGH, VANCOPEAK, VANCORANDOM, GENTTROUGH, GENTPEAK, GENTRANDOM, TOBRATROUGH, TOBRAPEAK, TOBRARND, AMIKACINPEAK, AMIKACINTROU, AMIKACIN,  in the last 72 hours   Microbiology: Recent Results (from the past 720 hour(s))  CULTURE, BLOOD (ROUTINE X 2)     Status: None   Collection Time    11/23/13  3:35 AM      Result Value Ref Range Status   Specimen Description BLOOD LEFT FEMORAL ARTERY CENTRAL LINE   Final   Special Requests BOTTLES DRAWN AEROBIC ONLY 10CC   Final   Culture  Setup Time     Final   Value: 11/23/2013 08:21     Performed at [facility]   Culture     Final   Value:        BLOOD CULTURE RECEIVED NO GROWTH TO DATE CULTURE WILL BE HELD FOR 5 DAYS BEFORE ISSUING A FINAL NEGATIVE REPORT   Performed at [facility]   Report Status PENDING   Incomplete  CULTURE, BLOOD (ROUTINE X 2)     Status: None   Collection Time    11/23/13  3:45 AM      Result Value Ref Range Status   Specimen Description BLOOD LEFT ARM   Final   Special Requests BOTTLES DRAWN AEROBIC AND ANAEROBIC [place] 3CC RED   Final   Culture  Setup Time     Final   Value: 11/23/2013 08:21     Performed at [facility]   Culture     Final   Value:        BLOOD CULTURE RECEIVED NO GROWTH TO DATE CULTURE WILL BE HELD FOR 5 DAYS BEFORE ISSUING A FINAL NEGATIVE REPORT     Performed at [facility]   Report Status PENDING   Incomplete  MRSA PCR SCREENING     Status: Abnormal   Collection Time    11/23/13  5:49 AM      Result Value Ref Range Status   MRSA by PCR POSITIVE (*) NEGATIVE Final   Comment:            The GeneXpert MRSA Assay (FDA     approved for NASAL specimens     only), is one component of a  comprehensive MRSA colonization     surveillance program. It is not     intended to diagnose MRSA     infection nor to guide or     monitor treatment for     MRSA infections.     RESULT CALLED TO, READ BACK BY AND VERIFIED WITH:     CHRIS HAYES,RN [id number] 11/23/13 BY KBARR  CULTURE, BLOOD (ROUTINE X 2)     Status: None   Collection Time    11/23/13  7:00 AM      Result Value Ref Range Status   Specimen Description BLOOD LEFT HAND   Final   Special Requests     Final   Value: BOTTLES DRAWN AEROBIC AND ANAEROBIC 10CC BLUE, 8CC RED   Culture  Setup Time     Final   Value: 11/23/2013 12:20     Performed at [facility]   Culture     Final   Value:        BLOOD CULTURE RECEIVED NO GROWTH TO DATE CULTURE WILL BE HELD FOR 5 DAYS BEFORE ISSUING A FINAL NEGATIVE REPORT     Performed at [facility]   Report Status PENDING   Incomplete  CULTURE, BLOOD (ROUTINE X 2)     Status: None   Collection Time    11/23/13  7:22 AM      Result Value Ref Range Status   Specimen  Description BLOOD RIGHT HAND   Final   Special Requests     Final   Value: BOTTLES DRAWN AEROBIC AND ANAEROBIC 10CC BLUE, 6 CC RED   Culture  Setup Time     Final   Value: 11/23/2013 12:20     Performed at [facility]   Culture     Final   Value:        BLOOD CULTURE RECEIVED NO GROWTH TO DATE CULTURE WILL BE HELD FOR 5 DAYS BEFORE ISSUING A FINAL NEGATIVE REPORT     Performed at [facility]   Report Status PENDING   Incomplete    Anti-infectives   Start     Dose/Rate Route Frequency Ordered Stop   11/23/13 2200  aztreonam (AZACTAM) 1 g in dextrose 5 % 50 mL IVPB     1 g 100 mL/hr over 30 Minutes Intravenous Every 24 hours 11/23/13 0343 11/29/13 2359   11/23/13 0345  aztreonam (AZACTAM) 1 g in dextrose 5 % 50 mL IVPB     1 g 100 mL/hr over 30 Minutes Intravenous  Once 11/23/13 0334 11/23/13 0645   11/23/13 0330  vancomycin (VANCOCIN) 1,500 mg in sodium chloride 0.9 % 500 mL IVPB     1,500 mg 250 mL/hr over 120 Minutes Intravenous  Once 11/23/13 0320 11/25/13 1501      Assessment: Patient is a 43 y.o F on aztreonam for empiric coverage.  Patient is afebrile, wbc wnl, and all cultures have been negative thus far.  Plan to stop abx on 7/05.   Plan:  1) continue aztreonam 1gm IV q24h for now  Cinderella Christoffersen P 11/26/2013,11:52 AM

## 2013-11-26 NOTE — Progress Notes (Signed)
NUTRITION FOLLOW-UP  DOCUMENTATION CODES Per approved criteria  -Not Applicable   INTERVENTION: Add Nepro Shake po BID, each supplement provides 425 kcal and 19 grams protein. RD to continue to follow nutrition care plan.  NUTRITION DIAGNOSIS: Inadequate oral intake now related to limited oral intake as evidenced by variable intake.  Goal: Intake to meet >90% of estimated nutrition needs.  Monitor:  weight trends, lab trends, I/O's, PO intake, supplement tolerance  ASSESSMENT: 43 year old female with PMH relevant for DM type 1, ESRD post kidney and pancreas transplant but now back on HD, HTN, GERD. Presents to the ED complaining of weakness and dizziness. Found to be in severe DKA. Also severe hyperkalemia with wide QRS complexes. Intubated for airway protection.  Intubated 6/29 - 6/30.  Currently ordered for Renal/CHO Modified Medium diet with 1200 ml fluid restriction. Currently in HD. Enjoys Nepro Shakes - will order to help meet patient's nutrition needs.  Sodium low at 135 Potassium WNL Phosphorus elevated at 11.6 CBG's: 49 - 184  Height: Ht Readings from Last 1 Encounters:  11/23/13 5' 1.02" (1.55 m)    Weight: Wt Readings from Last 1 Encounters:  11/26/13 164 lb 0.4 oz (74.4 kg)  72.6 kg s/p HD on 6/30  EDW per renal: 67.5 kg  BMI:  Body mass index is 30.97 kg/(m^2). Obese Class I  Estimated Nutritional Needs: Kcal: 1800 - 2000 Protein: at least 80 gram Fluid: 1.2 L  Skin: intact  Diet Order: Diabetic with Renal Restrictions    Intake/Output Summary (Last 24 hours) at 11/26/13 1217 Last data filed at 11/26/13 0600  Gross per 24 hour  Intake   1550 ml  Output      0 ml  Net   1550 ml    Last BM: 7/1   Labs:   Recent Labs Lab 11/23/13 0325  11/23/13 0500  11/23/13 0750  11/25/13 1100 11/25/13 1700 11/26/13 0205  NA  --   < > 128*  < > 127*  < > 136* 133* 135*  K  --   < > 4.5  < > 4.0  < > 3.8 4.6 3.9  CL  --   < > 76*  < > 75*  < >  95* 94* 93*  CO2  --   < > 11*  < > 10*  < > 24 24 26   BUN  --   < > 50*  < > 50*  < > 19 24* 26*  CREATININE  --   < > 8.25*  < > 8.36*  < > 5.16* 5.53* 6.07*  CALCIUM  --   < > 7.1*  < > 7.2*  < > 7.6* 7.6* 8.0*  MG 2.4  --   --   --   --   --   --   --   --   PHOS  --   --  11.9*  --  11.6*  --   --   --   --   GLUCOSE  --   < > 1107*  < > 1105*  < > 94 323* 33*  < > = values in this interval not displayed.  CBG (last 3)   Recent Labs  11/26/13 0324 11/26/13 0354 11/26/13 0811  GLUCAP 49* 123* 184*    Scheduled Meds: . antiseptic oral rinse  15 mL Mouth Rinse QID  . aspirin EC  81 mg Oral Daily  . aztreonam  1 g Intravenous Q24H  . calcium acetate  2,001 mg Oral TID WC  . chlorhexidine  15 mL Mouth Rinse BID  . Chlorhexidine Gluconate Cloth  6 each Topical Q0600  . clonazePAM  0.5 mg Oral QHS  . darbepoetin (ARANESP) injection - DIALYSIS  60 mcg Intravenous Q Tue-HD  . doxercalciferol  5 mcg Intravenous Q T,Th,Sa-HD  . ferric gluconate (FERRLECIT/NULECIT) IV  62.5 mg Intravenous Q Thu-HD  . FLUoxetine  20 mg Oral QHS  . gabapentin  100 mg Oral Daily  . heparin  5,000 Units Subcutaneous 3 times per day  . insulin aspart  0-5 Units Subcutaneous TID WC  . insulin glargine  5 Units Subcutaneous Daily  . metoprolol tartrate  100 mg Oral Once  . metoprolol succinate  100 mg Oral QPM  . montelukast  10 mg Oral QHS  . mupirocin ointment  1 application Nasal BID  . pantoprazole  40 mg Oral Daily    Continuous Infusions:    Inda Coke MS, RD, LDN Inpatient Registered Dietitian Pager: [phone] After-hours pager: [phone]

## 2013-11-26 NOTE — Procedures (Signed)
I was present at this dialysis session, have reviewed the session itself and made  appropriate changes  Kelly Splinter MD (pgr) [phone]    ([phone] 11/26/2013, 12:40 PM

## 2013-11-27 ENCOUNTER — Inpatient Hospital Stay (HOSPITAL_COMMUNITY): Payer: Medicare Other

## 2013-11-27 DIAGNOSIS — E1365 Other specified diabetes mellitus with hyperglycemia: Secondary | ICD-10-CM

## 2013-11-27 DIAGNOSIS — IMO0002 Reserved for concepts with insufficient information to code with codable children: Secondary | ICD-10-CM

## 2013-11-27 DIAGNOSIS — M7989 Other specified soft tissue disorders: Secondary | ICD-10-CM

## 2013-11-27 DIAGNOSIS — E1369 Other specified diabetes mellitus with other specified complication: Secondary | ICD-10-CM

## 2013-11-27 LAB — HEPATIC FUNCTION PANEL
ALK PHOS: 152 U/L — AB (ref 39–117)
ALT: 14 U/L (ref 0–35)
AST: 18 U/L (ref 0–37)
Albumin: 2.7 g/dL — ABNORMAL LOW (ref 3.5–5.2)
TOTAL PROTEIN: 6.8 g/dL (ref 6.0–8.3)
Total Bilirubin: 0.3 mg/dL (ref 0.3–1.2)

## 2013-11-27 LAB — IRON AND TIBC
IRON: 36 ug/dL — AB (ref 42–135)
Saturation Ratios: 24 % (ref 20–55)
TIBC: 147 ug/dL — ABNORMAL LOW (ref 250–470)
UIBC: 111 ug/dL — AB (ref 125–400)

## 2013-11-27 LAB — BASIC METABOLIC PANEL
Anion gap: 12 (ref 5–15)
Anion gap: 15 (ref 5–15)
BUN: 14 mg/dL (ref 6–23)
BUN: 23 mg/dL (ref 6–23)
CALCIUM: 8.5 mg/dL (ref 8.4–10.5)
CALCIUM: 9.3 mg/dL (ref 8.4–10.5)
CO2: 29 mEq/L (ref 19–32)
CO2: 26 mEq/L (ref 19–32)
Chloride: 93 mEq/L — ABNORMAL LOW (ref 96–112)
Chloride: 90 mEq/L — ABNORMAL LOW (ref 96–112)
Creatinine, Ser: 3.89 mg/dL — ABNORMAL HIGH (ref 0.50–1.10)
Creatinine, Ser: 5 mg/dL — ABNORMAL HIGH (ref 0.50–1.10)
GFR calc non Af Amer: 13 mL/min — ABNORMAL LOW (ref >90)
GFR, EST AFRICAN AMERICAN: 15 mL/min — AB (ref >90)
GFR, EST AFRICAN AMERICAN: 11 mL/min — AB (ref >90)
GFR, EST NON AFRICAN AMERICAN: 10 mL/min — AB (ref >90)
Glucose, Bld: 88 mg/dL (ref 70–99)
Glucose, Bld: 268 mg/dL — ABNORMAL HIGH (ref 70–99)
POTASSIUM: 4.9 meq/L (ref 3.7–5.3)
Potassium: 4.4 mEq/L (ref 3.7–5.3)
SODIUM: 134 meq/L — AB (ref 137–147)
Sodium: 131 mEq/L — ABNORMAL LOW (ref 137–147)

## 2013-11-27 LAB — GLUCOSE, CAPILLARY
GLUCOSE-CAPILLARY: 77 mg/dL (ref 70–99)
GLUCOSE-CAPILLARY: 125 mg/dL — AB (ref 70–99)
GLUCOSE-CAPILLARY: 173 mg/dL — AB (ref 70–99)
GLUCOSE-CAPILLARY: 354 mg/dL — AB (ref 70–99)
Glucose-Capillary: 56 mg/dL — ABNORMAL LOW (ref 70–99)
Glucose-Capillary: 60 mg/dL — ABNORMAL LOW (ref 70–99)
Glucose-Capillary: 405 mg/dL — ABNORMAL HIGH (ref 70–99)
Glucose-Capillary: 487 mg/dL — ABNORMAL HIGH (ref 70–99)

## 2013-11-27 LAB — LIPASE, BLOOD: Lipase: 76 U/L — ABNORMAL HIGH (ref 11–59)

## 2013-11-27 LAB — FERRITIN: Ferritin: 1529 ng/mL — ABNORMAL HIGH (ref 10–291)

## 2013-11-27 LAB — VITAMIN B12: VITAMIN B 12: 1485 pg/mL — AB (ref 211–911)

## 2013-11-27 LAB — HEMOGLOBIN A1C
Hgb A1c MFr Bld: 11.4 % — ABNORMAL HIGH (ref <5.7)
Mean Plasma Glucose: 280 mg/dL — ABNORMAL HIGH (ref <117)

## 2013-11-27 LAB — TRANSFERRIN: Transferrin: 103 mg/dL — ABNORMAL LOW (ref 200–360)

## 2013-11-27 MED ORDER — HYDRALAZINE HCL 20 MG/ML IJ SOLN: 10.0000 mg | INTRAMUSCULAR | Status: DC | PRN

## 2013-11-27 MED ORDER — LEVOFLOXACIN IN D5W 750 MG/150ML IV SOLN
750.0000 mg | Freq: Once | INTRAVENOUS | Status: AC
  Administered 2013-11-27: 750 mg via INTRAVENOUS
  Filled 2013-11-27: qty 150

## 2013-11-27 MED ORDER — INSULIN ASPART 100 UNIT/ML ~~LOC~~ SOLN: 0.0000 [IU] | Freq: Three times a day (TID) | SUBCUTANEOUS | Status: DC

## 2013-11-27 MED ORDER — METOCLOPRAMIDE HCL 5 MG/ML IJ SOLN
10.0000 mg | Freq: Three times a day (TID) | INTRAMUSCULAR | Status: DC
  Administered 2013-11-27 – 2013-11-30 (×11): 10 mg via INTRAVENOUS
  Filled 2013-11-27 (×18): qty 2

## 2013-11-27 MED ORDER — SALINE SPRAY 0.65 % NA SOLN
1.0000 | NASAL | Status: DC | PRN
  Filled 2013-11-27: qty 44

## 2013-11-27 MED ORDER — INSULIN ASPART 100 UNIT/ML ~~LOC~~ SOLN
0.0000 [IU] | Freq: Three times a day (TID) | SUBCUTANEOUS | Status: DC
  Administered 2013-11-27: 4 [IU] via SUBCUTANEOUS

## 2013-11-27 MED ORDER — INSULIN GLARGINE 100 UNIT/ML ~~LOC~~ SOLN
3.0000 [IU] | Freq: Every day | SUBCUTANEOUS | Status: DC
  Administered 2013-11-28 – 2013-11-29 (×2): 3 [IU] via SUBCUTANEOUS
  Filled 2013-11-27 (×3): qty 0.03

## 2013-11-27 MED ORDER — METOPROLOL SUCCINATE ER 100 MG PO TB24
100.0000 mg | ORAL_TABLET | Freq: Every evening | ORAL | Status: DC
  Administered 2013-11-28 – 2013-12-01 (×3): 100 mg via ORAL
  Filled 2013-11-27 (×5): qty 1

## 2013-11-27 MED ORDER — ASPIRIN-ACETAMINOPHEN-CAFFEINE 250-250-65 MG PO TABS
2.0000 | ORAL_TABLET | Freq: Once | ORAL | Status: AC
  Administered 2013-11-27: 2 via ORAL
  Filled 2013-11-27: qty 2

## 2013-11-27 MED ORDER — ONDANSETRON HCL 4 MG/2ML IJ SOLN
4.0000 mg | Freq: Four times a day (QID) | INTRAMUSCULAR | Status: DC | PRN
Start: 2013-11-27 — End: 2013-11-30
  Administered 2013-11-29 – 2013-11-30 (×2): 4 mg via INTRAVENOUS

## 2013-11-27 MED ORDER — OXYMETAZOLINE HCL 0.05 % NA SOLN
1.0000 | Freq: Two times a day (BID) | NASAL | Status: AC
  Administered 2013-11-27 – 2013-11-28 (×3): 1 via NASAL
  Filled 2013-11-27: qty 15

## 2013-11-27 NOTE — Progress Notes (Signed)
VASCULAR LAB PRELIMINARY  PRELIMINARY  PRELIMINARY  PRELIMINARY  Right lower extremity venous duplex completed.    Preliminary report:  Right:  No evidence of DVT, superficial thrombosis, or Baker's cyst.  Orvell Careaga, RVT 11/27/2013, 2:06 PM

## 2013-11-27 NOTE — Progress Notes (Signed)
ANTIBIOTIC CONSULT NOTE - FOLLOW UP  Pharmacy Consult for levaquin Indication: PNA  Allergies  Allergen Reactions  . Depakote [Divalproex Sodium] Other (See Comments)    Pt gets "delirious"  . Morphine And Related Nausea And Vomiting and Other (See Comments)    "makes me delirious"  . Penicillins Anaphylaxis    Tolerated Zosyn Dec 2014  . Tramadol Nausea And Vomiting  . Imitrex [Sumatriptan] Other (See Comments)    seizures  . Vicodin [Hydrocodone-Acetaminophen] Itching and Rash    Patient Measurements: Height: 5' 1.02" (155 cm) Weight: 154 lb 8.7 oz (70.1 kg) IBW/kg (Calculated) : 47.86   Vital Signs: Temp: 97.6 F (36.4 C) (07/03 0945) Temp src: Oral (07/03 0945) BP: 184/89 mmHg (07/03 0945) Pulse Rate: 89 (07/03 0945) Intake/Output from previous day: 07/02 0701 - 07/03 0700 In: 480 [P.O.:480] Out: 4532  Intake/Output from this shift:    Labs:  Recent Labs  11/25/13 1700 11/26/13 0205 11/27/13 0500  WBC  --  6.9  --   HGB  --  8.7*  --   PLT  --  128*  --   CREATININE 5.53* 6.07* 3.89*   Estimated Creatinine Clearance: 17.1 ml/min (by C-G formula based on Cr of 3.89). No results found for this basename: VANCOTROUGH, VANCOPEAK, VANCORANDOM, GENTTROUGH, GENTPEAK, GENTRANDOM, TOBRATROUGH, TOBRAPEAK, TOBRARND, AMIKACINPEAK, AMIKACINTROU, AMIKACIN,  in the last 72 hours   Microbiology: Recent Results (from the past 720 hour(s))  CULTURE, BLOOD (ROUTINE X 2)     Status: None   Collection Time    11/23/13  3:35 AM      Result Value Ref Range Status   Specimen Description BLOOD LEFT FEMORAL ARTERY CENTRAL LINE   Final   Special Requests BOTTLES DRAWN AEROBIC ONLY 10CC   Final   Culture  Setup Time     Final   Value: 11/23/2013 08:21     Performed at [facility]   Culture     Final   Value:        BLOOD CULTURE RECEIVED NO GROWTH TO DATE CULTURE WILL BE HELD FOR 5 DAYS BEFORE ISSUING A FINAL NEGATIVE REPORT     Performed at [facility]   Report Status PENDING   Incomplete  CULTURE, BLOOD (ROUTINE X 2)     Status: None   Collection Time    11/23/13  3:45 AM      Result Value Ref Range Status   Specimen Description BLOOD LEFT ARM   Final   Special Requests BOTTLES DRAWN AEROBIC AND ANAEROBIC [place] 3CC RED   Final   Culture  Setup Time     Final   Value: 11/23/2013 08:21     Performed at [facility]   Culture     Final   Value:        BLOOD CULTURE RECEIVED NO GROWTH TO DATE CULTURE WILL BE HELD FOR 5 DAYS BEFORE ISSUING A FINAL NEGATIVE REPORT     Performed at [facility]   Report Status PENDING   Incomplete  MRSA PCR SCREENING     Status: Abnormal   Collection Time    11/23/13  5:49 AM      Result Value Ref Range Status   MRSA by PCR POSITIVE (*) NEGATIVE Final   Comment:            The GeneXpert MRSA Assay (FDA     approved for NASAL specimens     only), is one component of a  comprehensive MRSA colonization     surveillance program. It is not     intended to diagnose MRSA     infection nor to guide or     monitor treatment for     MRSA infections.     RESULT CALLED TO, READ BACK BY AND VERIFIED WITH:     CHRIS HAYES,RN [id number] 11/23/13 BY KBARR  CULTURE, BLOOD (ROUTINE X 2)     Status: None   Collection Time    11/23/13  7:00 AM      Result Value Ref Range Status   Specimen Description BLOOD LEFT HAND   Final   Special Requests     Final   Value: BOTTLES DRAWN AEROBIC AND ANAEROBIC 10CC BLUE, 8CC RED   Culture  Setup Time     Final   Value: 11/23/2013 12:20     Performed at [facility]   Culture     Final   Value:        BLOOD CULTURE RECEIVED NO GROWTH TO DATE CULTURE WILL BE HELD FOR 5 DAYS BEFORE ISSUING A FINAL NEGATIVE REPORT     Performed at [facility]   Report Status PENDING   Incomplete  CULTURE, BLOOD (ROUTINE X 2)     Status: None   Collection Time    11/23/13  7:22 AM      Result Value Ref Range Status   Specimen Description BLOOD RIGHT HAND   Final    Special Requests     Final   Value: BOTTLES DRAWN AEROBIC AND ANAEROBIC 10CC BLUE, 6 CC RED   Culture  Setup Time     Final   Value: 11/23/2013 12:20     Performed at [facility]   Culture     Final   Value:        BLOOD CULTURE RECEIVED NO GROWTH TO DATE CULTURE WILL BE HELD FOR 5 DAYS BEFORE ISSUING A FINAL NEGATIVE REPORT     Performed at [facility]   Report Status PENDING   Incomplete    Anti-infectives   Start     Dose/Rate Route Frequency Ordered Stop   11/27/13 1500  levofloxacin (LEVAQUIN) IVPB 750 mg     750 mg 100 mL/hr over 90 Minutes Intravenous  Once 11/27/13 1424     11/23/13 2200  aztreonam (AZACTAM) 1 g in dextrose 5 % 50 mL IVPB  Status:  Discontinued     1 g 100 mL/hr over 30 Minutes Intravenous Every 24 hours 11/23/13 0343 11/27/13 1424   11/23/13 0345  aztreonam (AZACTAM) 1 g in dextrose 5 % 50 mL IVPB     1 g 100 mL/hr over 30 Minutes Intravenous  Once 11/23/13 0334 11/23/13 0645   11/23/13 0330  vancomycin (VANCOCIN) 1,500 mg in sodium chloride 0.9 % 500 mL IVPB     1,500 mg 250 mL/hr over 120 Minutes Intravenous  Once 11/23/13 0320 11/25/13 1501      Assessment: Patient is a 43 y.o who has been on vanc (dced) and aztreonam for PNA. It was supposed to be dced on 7/5. The plan was to change to levaquin to complete therapy. Since she is not on vanc anymore, aztreonam will not cover gram positive so we'll change to levaquin today x1 doses to give her 7 days of total abx. [place] with Dr. Sheran Fava.    Plan:   Dc aztreonam Levaquin 750mg  IV x1 Rx sign off

## 2013-11-27 NOTE — Progress Notes (Signed)
Hypoglycemic Event  CBG: 56  Treatment: 15 GM carbohydrate snack  Symptoms: None  Follow-up CBG: MBWG:[id number] CBG Result:60  Possible Reasons for Event: Inadequate meal intake  Comments/MD notified:yes    [name]  Remember to initiate Hypoglycemia Order Set & complete

## 2013-11-27 NOTE — Progress Notes (Addendum)
[facility] PROGRESS NOTE  [name] ZOX:[id number] DOB: [birth year] DOA: 11/23/2013 PCP: [name], MD  Assessment/Plan  DKA with T1DM, patient appears to have low total daily insulin requirements based on her insulin drip and she had multiple episodes of hypoglycemia after being transitioned to subcutaneous insulin.  Despite decreasing insulin yesterday, she has had multiple hypoglycemic events due to not eating.  -  Decrease Lantus to 3 units -  D/c SSI -  Liberalize diet -  Continue supplements -  A1c 11.4  Nausea, vomiting, started yesterday, unclear etiology, may have some gastroparesis -  Liver function tests:  Improved/back to near baseline -  Lipase mildly elevated, but no evidence of pancreatitis on CT  -  CT abd/pelvis unremarkable -  Start zofran and reglan -  Liberalize diet -  Continue supplements  Headache, likely secondary to recurrent hypoglycemia -  Continue tylenol prn -   Patient allergic to imitrex, depakote, tramadol -   Trial of excedrine migraine x 1  Acute respiratory failure due to CAP, DKA s/p intubation on 6/29 and extubation on 6/30 -  Transition to levofloxacin for better gram positive coverage -  Wean oxygen as tolerated -  OOB -  IS -  Repeat CXR:  stable -  Afrin + nasal saline -  BCx NGTD  Abdominal pain, given DM and HTN, at risk for ischemia, although pain not worse with eating -  Ct angio abd/pelvis:  No acute process  Hyperkalemia with wide-QRS complex, resolved with HD -  Telemetry:  NSR, okay to d/c telemetry   HTN, blood pressure elevated this AM -  Increase metoprolol to 100 mg  -  Start hydralazine prn  ESRD, Tues, Thur, Sat schedule -  Per nephrology  Normocytic anemia -  Iron studies c/w chronic disease.  Ferritin markedly elevated -  B12 1485, folate 970 -  Occult stool pending  Asthma, stable, continue albuterol prn  Leukocytosis, possible CAP.  UA not obtained initially and likely to be  negative after 4 days of broad spectrum abx.  Resolved.    Metabolic encephalopathy due electrolyte disturbance/hyperkalemia, DKA, resolved.  Right lower extremity swelling:  Duplex neg for DVT  Diet:  Diabetic/renal Access:  Left femoral CVC IVF: None Proph:  heparin  Code Status: full Family Communication: patient alone Disposition Plan: pending improvement in CBG. Possibly homeless.  SW consulted.     Consultants:  Nephrology  PCCM  Procedures:  6/29 - intubated  6/30 - extubated  Left femoral CVC 6/29  Antibiotics: Aztreonam (PCN allergic) 6/29 >>> 7/3 Vancomycin 6/29 >>> 6/30 Levofloxacin 7/3 >>   HPI/Subjective:  Persistent abdominal pain, worsening SOB, cough, sinus congestion, nausea, heaves, and headache.  Recurrent hypoglycemia  Objective: Filed Vitals:   11/26/13 2026 11/26/13 2100 11/27/13 0416 11/27/13 0945  BP: 175/93 150/82 164/81 184/89  Pulse: 100  96 89  Temp: 98.1 F (36.7 C)  98.1 F (36.7 C) 97.6 F (36.4 C)  TempSrc:   Oral Oral  Resp: 17  16 17   Height:      Weight:   70.1 kg (154 lb 8.7 oz)   SpO2: 94%  97% 99%    Intake/Output Summary (Last 24 hours) at 11/27/13 1007 Last data filed at 11/26/13 2247  Gross per 24 hour  Intake    480 ml  Output   4532 ml  Net  -4052 ml   Filed Weights   11/26/13 0834 11/26/13 1252 11/27/13 0416  Weight: 74.4 kg (164  lb 0.4 oz) 67.9 kg (149 lb 11.1 oz) 70.1 kg (154 lb 8.7 oz)    Exam:   General:  BF, No acute distress  HEENT:  NCAT, MMM  Cardiovascular:  RRR, nl S1, S2 no mrg, 2+ pulses, warm extremities  Respiratory:  Course rales at bilateral bases, no wheezes or rhonchi, no increased WOB  Abdomen:   NABS, soft, ND, mildly TTP diffusely without rebound or guarding  MSK:   Normal tone and bulk, no LEE  Neuro:  Grossly intact  Data Reviewed: Basic Metabolic Panel:  Recent Labs Lab 11/23/13 0325  11/23/13 0500  11/23/13 0750  11/25/13 0307 11/25/13 1100 11/25/13 1700  11/26/13 0205 11/27/13 0500  NA  --   < > 128*  < > 127*  < > 132* 136* 133* 135* 134*  K  --   < > 4.5  < > 4.0  < > 3.9 3.8 4.6 3.9 4.4  CL  --   < > 76*  < > 75*  < > 90* 95* 94* 93* 93*  CO2  --   < > 11*  < > 10*  < > 20 24 24 26 29   GLUCOSE  --   < > 1107*  < > 1105*  < > 286* 94 323* 33* 88  BUN  --   < > 50*  < > 50*  < > 17 19 24* 26* 14  CREATININE  --   < > 8.25*  < > 8.36*  < > 4.40* 5.16* 5.53* 6.07* 3.89*  CALCIUM  --   < > 7.1*  < > 7.2*  < > 7.5* 7.6* 7.6* 8.0* 8.5  MG 2.4  --   --   --   --   --   --   --   --   --   --   PHOS  --   --  11.9*  --  11.6*  --   --   --   --   --   --   < > = values in this interval not displayed. Liver Function Tests:  Recent Labs Lab 11/23/13 0150 11/23/13 0500 11/23/13 0750  AST 71* 130*  --   ALT 23 43*  --   ALKPHOS 210* 176*  --   BILITOT 0.5 0.3  --   PROT 7.9 6.3  --   ALBUMIN 3.5 2.7* 2.8*    Recent Labs Lab 11/23/13 0437  LIPASE 34  AMYLASE 273*   No results found for this basename: AMMONIA,  in the last 168 hours CBC:  Recent Labs Lab 11/23/13 0150 11/23/13 0332 11/23/13 0500 11/26/13 0205  WBC 13.1*  --  12.7* 6.9  NEUTROABS 11.0*  --   --  4.8  HGB 11.1* 13.6 9.8* 8.7*  HCT 38.7 40.0 31.3* 26.6*  MCV 111.8*  --  103.6* 96.7  PLT 249  --  191 128*   Cardiac Enzymes: No results found for this basename: CKTOTAL, CKMB, CKMBINDEX, TROPONINI,  in the last 168 hours BNP (last 3 results)  Recent Labs  05/03/13 0653 06/27/13 1510  PROBNP 33341.0* 5305.0*   CBG:  Recent Labs Lab 11/26/13 2349 11/27/13 0412 11/27/13 0829 11/27/13 0855 11/27/13 0942  GLUCAP 94 77 56* 60* 125*    Recent Results (from the past 240 hour(s))  CULTURE, BLOOD (ROUTINE X 2)     Status: None   Collection Time    11/23/13  3:35 AM  Result Value Ref Range Status   Specimen Description BLOOD LEFT FEMORAL ARTERY CENTRAL LINE   Final   Special Requests BOTTLES DRAWN AEROBIC ONLY 10CC   Final   Culture  Setup Time      Final   Value: 11/23/2013 08:21     Performed at [facility]   Culture     Final   Value:        BLOOD CULTURE RECEIVED NO GROWTH TO DATE CULTURE WILL BE HELD FOR 5 DAYS BEFORE ISSUING A FINAL NEGATIVE REPORT     Performed at [facility]   Report Status PENDING   Incomplete  CULTURE, BLOOD (ROUTINE X 2)     Status: None   Collection Time    11/23/13  3:45 AM      Result Value Ref Range Status   Specimen Description BLOOD LEFT ARM   Final   Special Requests BOTTLES DRAWN AEROBIC AND ANAEROBIC [place] 3CC RED   Final   Culture  Setup Time     Final   Value: 11/23/2013 08:21     Performed at [facility]   Culture     Final   Value:        BLOOD CULTURE RECEIVED NO GROWTH TO DATE CULTURE WILL BE HELD FOR 5 DAYS BEFORE ISSUING A FINAL NEGATIVE REPORT     Performed at [facility]   Report Status PENDING   Incomplete  MRSA PCR SCREENING     Status: Abnormal   Collection Time    11/23/13  5:49 AM      Result Value Ref Range Status   MRSA by PCR POSITIVE (*) NEGATIVE Final   Comment:            The GeneXpert MRSA Assay (FDA     approved for NASAL specimens     only), is one component of a     comprehensive MRSA colonization     surveillance program. It is not     intended to diagnose MRSA     infection nor to guide or     monitor treatment for     MRSA infections.     RESULT CALLED TO, READ BACK BY AND VERIFIED WITH:     CHRIS HAYES,RN [id number] 11/23/13 BY KBARR  CULTURE, BLOOD (ROUTINE X 2)     Status: None   Collection Time    11/23/13  7:00 AM      Result Value Ref Range Status   Specimen Description BLOOD LEFT HAND   Final   Special Requests     Final   Value: BOTTLES DRAWN AEROBIC AND ANAEROBIC 10CC BLUE, 8CC RED   Culture  Setup Time     Final   Value: 11/23/2013 12:20     Performed at [facility]   Culture     Final   Value:        BLOOD CULTURE RECEIVED NO GROWTH TO DATE CULTURE WILL BE HELD FOR 5 DAYS BEFORE ISSUING A FINAL  NEGATIVE REPORT     Performed at [facility]   Report Status PENDING   Incomplete  CULTURE, BLOOD (ROUTINE X 2)     Status: None   Collection Time    11/23/13  7:22 AM      Result Value Ref Range Status   Specimen Description BLOOD RIGHT HAND   Final   Special Requests     Final   Value: BOTTLES DRAWN AEROBIC AND ANAEROBIC  10CC BLUE, 6 CC RED   Culture  Setup Time     Final   Value: 11/23/2013 12:20     Performed at [facility]   Culture     Final   Value:        BLOOD CULTURE RECEIVED NO GROWTH TO DATE CULTURE WILL BE HELD FOR 5 DAYS BEFORE ISSUING A FINAL NEGATIVE REPORT     Performed at [facility]   Report Status PENDING   Incomplete     Studies: Ct Abdomen Pelvis W Contrast  11/26/2013   CLINICAL DATA:  Generalized abdominal pain with nausea, vomiting and diarrhea. History of diabetes and end-stage renal disease on dialysis.  EXAM: CT ABDOMEN AND PELVIS WITH CONTRAST  TECHNIQUE: Multidetector CT imaging of the abdomen and pelvis was performed using the standard protocol following bolus administration of intravenous contrast.  CONTRAST:  67mL OMNIPAQUE IOHEXOL 300 MG/ML  SOLN  COMPARISON:  CT 03/16/2013.  FINDINGS: There is improved aeration of the lung bases. There is mild bibasilar atelectasis and bronchiectasis. Left lower lobe pulmonary veins appear mildly engorged. The heart is enlarged. There is no significant residual pleural effusion. A small hiatal hernia is noted.  There is stable hepatomegaly. Periportal edema has resolved. There is no focal hepatic abnormality. The gallbladder is surgically absent. There is no biliary dilatation. The spleen and pancreas appear normal.  There is no adrenal mass. Both kidneys are atrophied with innumerable cystic lesions. Some of these have enlarged compared with the prior study, notably within the upper and lower poles of the right kidney. No suspicious renal lesions are identified. There is no hydronephrosis or  perinephric soft tissue stranding.  The stomach, small bowel, appendix and colon demonstrate no significant findings. 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 There is diffuse atherosclerosis without evidence of large vessel occlusion. The uterus, ovaries and bladder appear unchanged. There are postsurgical changes within the anterior abdominal wall. There are changes within the subcutaneous fat of the anterior abdominal wall consistent subcutaneous injections. Left femoral venous catheter and right groin vascular graft are noted.  There are no worrisome osseous findings.  IMPRESSION: 1. No acute abdominal pelvic findings. No explanation for the patient's symptoms identified. 2. Innumerable cystic lesions in both kidneys, some increased in size in the interval. No worrisome lesions identified. 3. Stable hepatomegaly.   Electronically Signed   By: Camie Patience M.D.   On: 11/26/2013 19:41    Scheduled Meds: . antiseptic oral rinse  15 mL Mouth Rinse QID  . aspirin EC  81 mg Oral Daily  . aztreonam  1 g Intravenous Q24H  . calcium acetate  2,001 mg Oral TID WC  . chlorhexidine  15 mL Mouth Rinse BID  . Chlorhexidine Gluconate Cloth  6 each Topical Q0600  . clonazePAM  0.5 mg Oral QHS  . [START ON 12/01/2013] darbepoetin (ARANESP) injection - DIALYSIS  100 mcg Intravenous Q Tue-HD  . doxercalciferol  5 mcg Intravenous Q T,Th,Sa-HD  . feeding supplement (NEPRO CARB STEADY)  237 mL Oral BID BM  . ferric gluconate (FERRLECIT/NULECIT) IV  62.5 mg Intravenous Q Thu-HD  . FLUoxetine  20 mg Oral QHS  . gabapentin  100 mg Oral Daily  . heparin  5,000 Units Subcutaneous 3 times per day  . [START ON 11/28/2013] insulin glargine  3 Units Subcutaneous Daily  . metoprolol succinate  100  mg Oral QPM  . montelukast  10 mg Oral QHS  . mupirocin ointment  1 application Nasal BID  .  pantoprazole  40 mg Oral Daily   Continuous Infusions:   Active Problems:   DKA (diabetic ketoacidoses)   Acute respiratory failure with hypoxia    Time spent: 30 min    Chynna Buerkle, [facility] Pager [phone]. If 7PM-7AM, please contact night-coverage at [email or web address], password [facility] 11/27/2013, 10:07 AM  LOS: 4 days

## 2013-11-27 NOTE — Progress Notes (Signed)
Late entry. Patient's blood sugar 34, no n/v noted a/a/o x4. d50 given per protocol. Pt given juice as well. Follow up blood sugar 96. Will continue to monitor

## 2013-11-27 NOTE — Progress Notes (Signed)
[facility] Progress Note  Assessment/Plan: 1. DKA/ DM1 - Mgmt per primary. On insulin. 2. ESRD - TTS, HD tomorrow - access flows to be checked after d/c - can follow up with [facility] if needed 3. Anemia - Hgb 8.7 < 9.8. Increase Aranesp to 100 q Tues. Last Tsat 37% on 6/25 outpatient. Follow CBC; note - she is on no heparin HD but getting sub Q heparin here - tells me she is on no heparin HD due to prolonged bleeding 4. Secondary hyperparathyroidism - . Home Phos[place] resumed. Last PTH 374. Continue Vit D. 5. HTN/volume - SBPs 180s. Still above edw - challenge volume with Sat HD 6. Nutrition - Renal diet, multivit 7. Pruritus - Phos extremely high. On admission - back on binders - Ca up to 8.5 so I expect P will be betters 8. Hx failed KP transplant  9. ^ LFTs -abdom CT neg; or repeat studies - still 3 kg above edw yesterday - could have a component of liver congestion or related to acute illness  [name], PA-C [place] [phone] 11/27/2013,11:10 AM  LOS: 4 days   Pt seen, examined and agree w A/P as above.  Kelly Splinter MD pager [phone]    cell [phone] 11/27/2013, 1:51 PM    Subjective:   Plans to live with brother after d/c and then maybe grandmother in [facility] afterwards. Notes some swelling in right leg -this happens when she needs to have an intervention for decrease access flow. Last had a few months ago.  Objective Filed Vitals:   11/26/13 2026 11/26/13 2100 11/27/13 0416 11/27/13 0945  BP: 175/93 150/82 164/81 184/89  Pulse: 100  96 89  Temp: 98.1 F (36.7 C)  98.1 F (36.7 C) 97.6 F (36.4 C)  TempSrc:   Oral Oral  Resp: 17  16 17   Height:      Weight:   70.1 kg (154 lb 8.7 oz)   SpO2: 94%  97% 99%   Physical Exam General: NAD Heart: RRR 2/6 murmur Lungs: no wheezes or rales Abdomen: soft mild diffuse tenderness Extremities: right thigh mild swelling SCDs LE bilaterall - no overt edema Dialysis  Access: right thigh AVGG + bruit somewhat pulsatile   Dialysis Orders:   TTS NW  F160 3h 80min 67.5kg 2/2.5 Bath AVG R thigh Heparin none  Aranesp 60/wk on Tues Hect 5ug TIW Venofer 50 mg q Thurs   Additional Objective Labs: Basic Metabolic Panel:  Recent Labs Lab 11/23/13 0500  11/23/13 0750  11/25/13 1700 11/26/13 0205 11/27/13 0500  NA 128*  < > 127*  < > 133* 135* 134*  K 4.5  < > 4.0  < > 4.6 3.9 4.4  CL 76*  < > 75*  < > 94* 93* 93*  CO2 11*  < > 10*  < > 24 26 29   GLUCOSE 1107*  < > 1105*  < > 323* 33* 88  BUN 50*  < > 50*  < > 24* 26* 14  CREATININE 8.25*  < > 8.36*  < > 5.53* 6.07* 3.89*  CALCIUM 7.1*  < > 7.2*  < > 7.6* 8.0* 8.5  PHOS 11.9*  --  11.6*  --   --   --   --   < > = values in this interval not displayed. Liver Function Tests:  Recent Labs Lab 11/23/13 0150 11/23/13 0500 11/23/13 0750  AST 71* 130*  --   ALT 23 43*  --  ALKPHOS 210* 176*  --   BILITOT 0.5 0.3  --   PROT 7.9 6.3  --   ALBUMIN 3.5 2.7* 2.8*    Recent Labs Lab 11/23/13 0437  LIPASE 34  AMYLASE 273*   CBC:  Recent Labs Lab 11/23/13 0150 11/23/13 0332 11/23/13 0500 11/26/13 0205  WBC 13.1*  --  12.7* 6.9  NEUTROABS 11.0*  --   --  4.8  HGB 11.1* 13.6 9.8* 8.7*  HCT 38.7 40.0 31.3* 26.6*  MCV 111.8*  --  103.6* 96.7  PLT 249  --  191 128*   Blood Culture    Component Value Date/Time   SDES BLOOD RIGHT HAND 11/23/2013 0722   SPECREQUEST BOTTLES DRAWN AEROBIC AND ANAEROBIC 10CC BLUE, 6 CC RED 11/23/2013 0722   CULT  Value:        BLOOD CULTURE RECEIVED NO GROWTH TO DATE CULTURE WILL BE HELD FOR 5 DAYS BEFORE ISSUING A FINAL NEGATIVE REPORT Performed at [facility] 11/23/2013 0722   REPTSTATUS PENDING 11/23/2013 0722   CBG:  Recent Labs Lab 11/26/13 2349 11/27/13 0412 11/27/13 0829 11/27/13 0855 11/27/13 0942  GLUCAP 94 77 56* 60* 125*   IStudies/Results: Ct Abdomen Pelvis W Contrast  11/26/2013   CLINICAL DATA:  Generalized abdominal pain with  nausea, vomiting and diarrhea. History of diabetes and end-stage renal disease on dialysis.  EXAM: CT ABDOMEN AND PELVIS WITH CONTRAST  TECHNIQUE: Multidetector CT imaging of the abdomen and pelvis was performed using the standard protocol following bolus administration of intravenous contrast.  CONTRAST:  76mL OMNIPAQUE IOHEXOL 300 MG/ML  SOLN  COMPARISON:  CT 03/16/2013.  FINDINGS: There is improved aeration of the lung bases. There is mild bibasilar atelectasis and bronchiectasis. Left lower lobe pulmonary veins appear mildly engorged. The heart is enlarged. There is no significant residual pleural effusion. A small hiatal hernia is noted.  There is stable hepatomegaly. Periportal edema has resolved. There is no focal hepatic abnormality. The gallbladder is surgically absent. There is no biliary dilatation. The spleen and pancreas appear normal.  There is no adrenal mass. Both kidneys are atrophied with innumerable cystic lesions. Some of these have enlarged compared with the prior study, notably within the upper and lower poles of the right kidney. No suspicious renal lesions are identified. There is no hydronephrosis or perinephric soft tissue stranding.  The stomach, small bowel, appendix and colon demonstrate no significant findings. 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 There is diffuse atherosclerosis without evidence of large vessel occlusion. The uterus, ovaries and bladder appear unchanged. There are postsurgical changes within the anterior abdominal wall. There are changes within the subcutaneous fat of the anterior abdominal wall consistent subcutaneous injections. Left femoral venous catheter and right groin vascular graft are noted.  There are no worrisome osseous findings.  IMPRESSION: 1. No acute abdominal pelvic findings. No explanation for the patient's  symptoms identified. 2. Innumerable cystic lesions in both kidneys, some increased in size in the interval. No worrisome lesions identified. 3. Stable hepatomegaly.   Electronically Signed   By: Camie Patience M.D.   On: 11/26/2013 19:41   Medications:   . antiseptic oral rinse  15 mL Mouth Rinse QID  . aspirin EC  81 mg Oral Daily  . aspirin-acetaminophen-caffeine  2 tablet Oral Once  . aztreonam  1 g Intravenous Q24H  . calcium acetate  2,001 mg Oral  TID WC  . chlorhexidine  15 mL Mouth Rinse BID  . Chlorhexidine Gluconate Cloth  6 each Topical Q0600  . clonazePAM  0.5 mg Oral QHS  . [START ON 12/01/2013] darbepoetin (ARANESP) injection - DIALYSIS  100 mcg Intravenous Q Tue-HD  . doxercalciferol  5 mcg Intravenous Q T,Th,Sa-HD  . feeding supplement (NEPRO CARB STEADY)  237 mL Oral BID BM  . ferric gluconate (FERRLECIT/NULECIT) IV  62.5 mg Intravenous Q Thu-HD  . FLUoxetine  20 mg Oral QHS  . gabapentin  100 mg Oral Daily  . heparin  5,000 Units Subcutaneous 3 times per day  . [START ON 11/28/2013] insulin glargine  3 Units Subcutaneous Daily  . metoCLOPramide (REGLAN) injection  10 mg Intravenous TID AC & HS  . metoprolol succinate  100 mg Oral QPM  . montelukast  10 mg Oral QHS  . mupirocin ointment  1 application Nasal BID  . oxymetazoline  1 spray Each Nare BID  . pantoprazole  40 mg Oral Daily

## 2013-11-27 NOTE — Progress Notes (Signed)
Patient BS 487, called Dr. Sheran Fava, ordered 4 units of Novolog.  No symptoms of hyperglycemia noted.

## 2013-11-27 NOTE — Progress Notes (Signed)
Hypoglycemic Event  CBG: 60  Treatment: D50 IV 25 mL  Symptoms: None  Follow-up CBG: OTRR:[id number] CBG Result:125  Possible Reasons for Event: Other: Not eaten her breakfast  Comments/MD notified:Yes    [name]  Remember to initiate Hypoglycemia Order Set & complete

## 2013-11-28 DIAGNOSIS — E162 Hypoglycemia, unspecified: Secondary | ICD-10-CM

## 2013-11-28 DIAGNOSIS — N189 Chronic kidney disease, unspecified: Secondary | ICD-10-CM

## 2013-11-28 DIAGNOSIS — R7309 Other abnormal glucose: Secondary | ICD-10-CM

## 2013-11-28 LAB — BASIC METABOLIC PANEL
ANION GAP: 12 (ref 5–15)
BUN: 26 mg/dL — ABNORMAL HIGH (ref 6–23)
CHLORIDE: 94 meq/L — AB (ref 96–112)
CO2: 28 mEq/L (ref 19–32)
Calcium: 9.1 mg/dL (ref 8.4–10.5)
Creatinine, Ser: 5.68 mg/dL — ABNORMAL HIGH (ref 0.50–1.10)
GFR calc Af Amer: 10 mL/min — ABNORMAL LOW (ref >90)
GFR, EST NON AFRICAN AMERICAN: 8 mL/min — AB (ref >90)
Glucose, Bld: 119 mg/dL — ABNORMAL HIGH (ref 70–99)
POTASSIUM: 5.4 meq/L — AB (ref 3.7–5.3)
Sodium: 134 mEq/L — ABNORMAL LOW (ref 137–147)

## 2013-11-28 LAB — GLUCOSE, CAPILLARY
GLUCOSE-CAPILLARY: 97 mg/dL (ref 70–99)
GLUCOSE-CAPILLARY: 152 mg/dL — AB (ref 70–99)
GLUCOSE-CAPILLARY: 280 mg/dL — AB (ref 70–99)
GLUCOSE-CAPILLARY: 269 mg/dL — AB (ref 70–99)
Glucose-Capillary: 35 mg/dL — CL (ref 70–99)
Glucose-Capillary: 99 mg/dL (ref 70–99)
Glucose-Capillary: 241 mg/dL — ABNORMAL HIGH (ref 70–99)

## 2013-11-28 LAB — FOLATE RBC: RBC Folate: 1179 ng/mL — ABNORMAL HIGH (ref >280)

## 2013-11-28 MED ORDER — INSULIN ASPART 100 UNIT/ML ~~LOC~~ SOLN
0.0000 [IU] | Freq: Three times a day (TID) | SUBCUTANEOUS | Status: DC
  Administered 2013-11-28 – 2013-11-29 (×3): 1 [IU] via SUBCUTANEOUS
  Administered 2013-11-29: 2 [IU] via SUBCUTANEOUS

## 2013-11-28 NOTE — Progress Notes (Signed)
Called dialysis pt has been moved to tomorrow morning dialysis per Dr. Melvia Heaps. Kathaleen Maser, RN from dialysis spoke with patient.

## 2013-11-28 NOTE — Progress Notes (Signed)
[facility] PROGRESS NOTE  [name] JJK:[id number] DOB: 04-12-1971 DOA: 11/23/2013 PCP: [name], MD  Assessment/Plan  DKA with T1DM, very labile blood sugars.  Gave 4 units of aspart for CBG of 487 last night and she became hypoglycemic to 35 overnight -  Continue Lantus to 3 units -  Decrease SSI scale -  Liberalize diet -  Continue supplements -  A1c 11.4  Nausea, vomiting, started yesterday, unclear etiology, may have some gastroparesis, but states she is eating some now -  Liver function tests:  Improved/back to near baseline -  Lipase mildly elevated, but no evidence of pancreatitis on CT  -  CT abd/pelvis unremarkable -  Start zofran and reglan -  Liberalize diet -  Continue supplements  Headache, likely secondary to recurrent hypoglycemia -  Continue tylenol prn -   Patient allergic to imitrex, depakote, tramadol -   Trial of excedrine migraine x 1  Acute respiratory failure due to CAP, DKA s/p intubation on 6/29 and extubation on 6/30.  May also be somewhat hypervolemic as dry weight is normally 67kg and she is currently 73kg. -  Transition to levofloxacin for better gram positive coverage, given last dose on 7/3 -  Wean oxygen as tolerated -  OOB -  IS -  Repeat CXR:  stable -  Afrin + nasal saline -  BCx NGTD  Abdominal pain, given DM and HTN, at risk for ischemia, although pain not worse with eating -  Ct angio abd/pelvis:  No acute process  Hyperkalemia with wide-QRS complex, resolved with HD.  Telemetry demonstrated NSR.  HTN, blood pressure elevated this AM -  Continue metoprolol 100 mg  -  Continue hydralazine prn -  Will not push for lower BPs for now because her BP drops considerably with HD -  Consider norvasc  ESRD, Tues, Thur, Sat schedule -  Per nephrology  Normocytic anemia -  Iron studies c/w chronic disease.  Ferritin markedly elevated -  B12 1485, folate 970 -  Occult stool pending  Asthma, stable, continue  albuterol prn  Leukocytosis, possible CAP.  UA not obtained initially and likely to be negative after 4 days of broad spectrum abx.  Resolved.    Metabolic encephalopathy due electrolyte disturbance/hyperkalemia, DKA, resolved.  Right lower extremity swelling:  Duplex neg for DVT.  Needs outpatient eval of right leg fistula  Diet:  Diabetic/renal Access:  Left femoral CVC IVF: None Proph:  heparin  Code Status: full Family Communication: patient alone Disposition Plan: pending improvement in CBG. Possibly homeless.  SW consulted yesterday but has not evaluated patient yet.      Consultants:  Nephrology  PCCM  Procedures:  6/29 - intubated  6/30 - extubated  Left femoral CVC 6/29  Antibiotics: Aztreonam (PCN allergic) 6/29 >>> 7/3 Vancomycin 6/29 >>> 6/30 Levofloxacin 7/3 >>   HPI/Subjective:  Persistent abdominal pain.  Eating better.  Still on oxygen and unable to wean to RA  Objective: Filed Vitals:   11/27/13 1745 11/27/13 2100 11/28/13 0447 11/28/13 1038  BP: 131/79 173/82 149/79 151/68  Pulse: 97 90 87 94  Temp: 98.3 F (36.8 C) 98.5 F (36.9 C) 97.5 F (36.4 C) 98.3 F (36.8 C)  TempSrc: Oral Oral Oral Oral  Resp: 17 19 18 17   Height:      Weight:  73.165 kg (161 lb 4.8 oz)    SpO2: 78% 100% 98% 98%   No intake or output data in the 24 hours ending 11/28/13  [place]   11/26/13 1252 11/27/13 0416 11/27/13 2100  Weight: 67.9 kg (149 lb 11.1 oz) 70.1 kg (154 lb 8.7 oz) 73.165 kg (161 lb 4.8 oz)    Exam:   General:  BF, No acute distress  HEENT:  NCAT, MMM  Cardiovascular:  RRR, nl S1, S2 no mrg, 2+ pulses, warm extremities  Respiratory:  Rales at bilateral bases, no wheezes or rhonchi, no increased WOB  Abdomen:   NABS, soft, ND, mildly TTP diffusely without rebound or guarding  MSK:   Normal tone and bulk, no LEE  Neuro:  Grossly intact  Data Reviewed: Basic Metabolic Panel:  Recent Labs Lab 11/23/13 0325   11/23/13 0500  11/23/13 0750  11/25/13 1700 11/26/13 0205 11/27/13 0500 11/27/13 2209 11/28/13 0530  NA  --   < > 128*  < > 127*  < > 133* 135* 134* 131* 134*  K  --   < > 4.5  < > 4.0  < > 4.6 3.9 4.4 4.9 5.4*  CL  --   < > 76*  < > 75*  < > 94* 93* 93* 90* 94*  CO2  --   < > 11*  < > 10*  < > 24 26 29 26 28   GLUCOSE  --   < > 1107*  < > 1105*  < > 323* 33* 88 268* 119*  BUN  --   < > 50*  < > 50*  < > 24* 26* 14 23 26*  CREATININE  --   < > 8.25*  < > 8.36*  < > 5.53* 6.07* 3.89* 5.00* 5.68*  CALCIUM  --   < > 7.1*  < > 7.2*  < > 7.6* 8.0* 8.5 9.3 9.1  MG 2.4  --   --   --   --   --   --   --   --   --   --   PHOS  --   --  11.9*  --  11.6*  --   --   --   --   --   --   < > = values in this interval not displayed. Liver Function Tests:  Recent Labs Lab 11/23/13 0150 11/23/13 0500 11/23/13 0750 11/27/13 0500  AST 71* 130*  --  18  ALT 23 43*  --  14  ALKPHOS 210* 176*  --  152*  BILITOT 0.5 0.3  --  0.3  PROT 7.9 6.3  --  6.8  ALBUMIN 3.5 2.7* 2.8* 2.7*    Recent Labs Lab 11/23/13 0437 11/27/13 0500  LIPASE 34 76*  AMYLASE 273*  --    No results found for this basename: AMMONIA,  in the last 168 hours CBC:  Recent Labs Lab 11/23/13 0150 11/23/13 0332 11/23/13 0500 11/26/13 0205  WBC 13.1*  --  12.7* 6.9  NEUTROABS 11.0*  --   --  4.8  HGB 11.1* 13.6 9.8* 8.7*  HCT 38.7 40.0 31.3* 26.6*  MCV 111.8*  --  103.6* 96.7  PLT 249  --  191 128*   Cardiac Enzymes: No results found for this basename: CKTOTAL, CKMB, CKMBINDEX, TROPONINI,  in the last 168 hours BNP (last 3 results)  Recent Labs  05/03/13 0653 06/27/13 1510  PROBNP 33341.0* 5305.0*   CBG:  Recent Labs Lab 11/27/13 2058 11/28/13 0138 11/28/13 0204 11/28/13 0447 11/28/13 0754  GLUCAP 354* 35* 99 97 152*    Recent Results (from the past 240  hour(s))  CULTURE, BLOOD (ROUTINE X 2)     Status: None   Collection Time    11/23/13  3:35 AM      Result Value Ref Range Status   Specimen  Description BLOOD LEFT FEMORAL ARTERY CENTRAL LINE   Final   Special Requests BOTTLES DRAWN AEROBIC ONLY 10CC   Final   Culture  Setup Time     Final   Value: 11/23/2013 08:21     Performed at [facility]   Culture     Final   Value:        BLOOD CULTURE RECEIVED NO GROWTH TO DATE CULTURE WILL BE HELD FOR 5 DAYS BEFORE ISSUING A FINAL NEGATIVE REPORT     Performed at [facility]   Report Status PENDING   Incomplete  CULTURE, BLOOD (ROUTINE X 2)     Status: None   Collection Time    11/23/13  3:45 AM      Result Value Ref Range Status   Specimen Description BLOOD LEFT ARM   Final   Special Requests BOTTLES DRAWN AEROBIC AND ANAEROBIC [place] 3CC RED   Final   Culture  Setup Time     Final   Value: 11/23/2013 08:21     Performed at [facility]   Culture     Final   Value:        BLOOD CULTURE RECEIVED NO GROWTH TO DATE CULTURE WILL BE HELD FOR 5 DAYS BEFORE ISSUING A FINAL NEGATIVE REPORT     Performed at [facility]   Report Status PENDING   Incomplete  MRSA PCR SCREENING     Status: Abnormal   Collection Time    11/23/13  5:49 AM      Result Value Ref Range Status   MRSA by PCR POSITIVE (*) NEGATIVE Final   Comment:            The GeneXpert MRSA Assay (FDA     approved for NASAL specimens     only), is one component of a     comprehensive MRSA colonization     surveillance program. It is not     intended to diagnose MRSA     infection nor to guide or     monitor treatment for     MRSA infections.     RESULT CALLED TO, READ BACK BY AND VERIFIED WITH:     CHRIS HAYES,RN [id number] 11/23/13 BY KBARR  CULTURE, BLOOD (ROUTINE X 2)     Status: None   Collection Time    11/23/13  7:00 AM      Result Value Ref Range Status   Specimen Description BLOOD LEFT HAND   Final   Special Requests     Final   Value: BOTTLES DRAWN AEROBIC AND ANAEROBIC 10CC BLUE, 8CC RED   Culture  Setup Time     Final   Value: 11/23/2013 12:20     Performed at [facility]   Culture     Final   Value:        BLOOD CULTURE RECEIVED NO GROWTH TO DATE CULTURE WILL BE HELD FOR 5 DAYS BEFORE ISSUING A FINAL NEGATIVE REPORT     Performed at [facility]   Report Status PENDING   Incomplete  CULTURE, BLOOD (ROUTINE X 2)     Status: None   Collection Time    11/23/13  7:22 AM      Result Value Ref  Range Status   Specimen Description BLOOD RIGHT HAND   Final   Special Requests     Final   Value: BOTTLES DRAWN AEROBIC AND ANAEROBIC 10CC BLUE, 6 CC RED   Culture  Setup Time     Final   Value: 11/23/2013 12:20     Performed at [facility]   Culture     Final   Value:        BLOOD CULTURE RECEIVED NO GROWTH TO DATE CULTURE WILL BE HELD FOR 5 DAYS BEFORE ISSUING A FINAL NEGATIVE REPORT     Performed at [facility]   Report Status PENDING   Incomplete     Studies: Ct Abdomen Pelvis W Contrast  11/26/2013   CLINICAL DATA:  Generalized abdominal pain with nausea, vomiting and diarrhea. History of diabetes and end-stage renal disease on dialysis.  EXAM: CT ABDOMEN AND PELVIS WITH CONTRAST  TECHNIQUE: Multidetector CT imaging of the abdomen and pelvis was performed using the standard protocol following bolus administration of intravenous contrast.  CONTRAST:  40mL OMNIPAQUE IOHEXOL 300 MG/ML  SOLN  COMPARISON:  CT 03/16/2013.  FINDINGS: There is improved aeration of the lung bases. There is mild bibasilar atelectasis and bronchiectasis. Left lower lobe pulmonary veins appear mildly engorged. The heart is enlarged. There is no significant residual pleural effusion. A small hiatal hernia is noted.  There is stable hepatomegaly. Periportal edema has resolved. There is no focal hepatic abnormality. The gallbladder is surgically absent. There is no biliary dilatation. The spleen and pancreas appear normal.  There is no adrenal mass. Both kidneys are atrophied with innumerable cystic lesions. Some of these have enlarged compared with the prior study,  notably within the upper and lower poles of the right kidney. No suspicious renal lesions are identified. There is no hydronephrosis or perinephric soft tissue stranding.  The stomach, small bowel, appendix and colon demonstrate no significant findings. 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 There is diffuse atherosclerosis without evidence of large vessel occlusion. The uterus, ovaries and bladder appear unchanged. There are postsurgical changes within the anterior abdominal wall. There are changes within the subcutaneous fat of the anterior abdominal wall consistent subcutaneous injections. Left femoral venous catheter and right groin vascular graft are noted.  There are no worrisome osseous findings.  IMPRESSION: 1. No acute abdominal pelvic findings. No explanation for the patient's symptoms identified. 2. Innumerable cystic lesions in both kidneys, some increased in size in the interval. No worrisome lesions identified. 3. Stable hepatomegaly.   Electronically Signed   By: Camie Patience M.D.   On: 11/26/2013 19:41   Dg Chest Port 1 View  11/27/2013   CLINICAL DATA:  Increased breast sounds  EXAM: PORTABLE CHEST - 1 VIEW  COMPARISON:  11/23/2013  FINDINGS: The endotracheal tube is been removed. The cardiac shadow is stable. The lungs are well aerated bilaterally. Mild bibasilar opacities are again seen and stable. No new focal abnormality is seen.  IMPRESSION: Stable bibasilar opacities when compared with the prior exam.   Electronically Signed   By: Inez Catalina M.D.   On: 11/27/2013 11:17    Scheduled Meds: . antiseptic oral rinse  15 mL Mouth Rinse QID  . aspirin EC  81 mg Oral Daily  . calcium acetate  2,001 mg Oral TID WC  . chlorhexidine  15 mL Mouth Rinse BID  . clonazePAM  0.5 mg Oral QHS  . [START ON 12/01/2013] darbepoetin (ARANESP) injection - DIALYSIS   100 mcg Intravenous Q Tue-HD  . doxercalciferol  5 mcg Intravenous Q T,Th,Sa-HD  . feeding supplement (NEPRO CARB STEADY)  237 mL Oral BID BM  . ferric gluconate (FERRLECIT/NULECIT) IV  62.5 mg Intravenous Q Thu-HD  . FLUoxetine  20 mg Oral QHS  . gabapentin  100 mg Oral Daily  . heparin  5,000 Units Subcutaneous 3 times per day  . insulin aspart  0-2 Units Subcutaneous TID WC  . insulin glargine  3 Units Subcutaneous Daily  . metoCLOPramide (REGLAN) injection  10 mg Intravenous TID AC & HS  . metoprolol succinate  100 mg Oral QPM  . montelukast  10 mg Oral QHS  . pantoprazole  40 mg Oral Daily   Continuous Infusions:   Active Problems:   DKA (diabetic ketoacidoses)   Acute respiratory failure with hypoxia    Time spent: 30 min    Lavel Rieman, [facility] Pager [phone]. If 7PM-7AM, please contact night-coverage at [email or web address], password [facility] 11/28/2013, 11:39 AM  LOS: 5 days

## 2013-11-28 NOTE — Progress Notes (Signed)
Patient called and stated dhe felt sugar was low. cbg cheked and was low, dextrose given per protocol. Pt a/a/o x4 no n/v. I will recheck sugar

## 2013-11-28 NOTE — Progress Notes (Signed)
[facility] Progress Note  Assessment/Plan: 1. DKA/ DM1 - Mgmt per primary. On insulin. 2. ESRD - TTS, HD today - access flows to be checked after discharge from hospital - can follow up with [facility] if needed K 5.4 7/4 3. Anemia - Hgb 8.7 < 9.8. Increase Aranesp to 100 q Tues. Last Tsat 37% on 6/25 outpatient. Follow CBC; note - she is on no heparin HD but getting sub Q heparin here - tells me she is on no heparin HD due to prolonged bleeding.  ferritin elevated Fe 36 - check cbc pre HD 4. Secondary hyperparathyroidism - . Home Phos[place] resumed. Last PTH 374. Continue Vit D. 5. HTN/volume - SBPs 130 -180s. - significantly above edw 6. Nutrition - Renal diet, multivit 7. Pruritus - Phos extremely high. On admission - back on binders - Ca up to 8.5 so I expect P will be betters - Ca improved - check P with HD today 8. Hx failed KP transplant  9. ^ LFTs -abdom CT neg; or repeat studies  LFT have normalized 10. Thrombocytopenia - platelets down to 128 7/2 repeat CBC today pre HD 11. Acute resp failure secondary to HCAP - intubated briefly - last  levaquin 7/3 - MRSA + bactroban; WBC wnl  [name], PA-C [place] [phone] 11/28/2013,10:12 AM  LOS: 5 days   Pt seen, examined and agree w A/P as above.  Kelly Splinter MD pager [phone]    cell [phone] 11/28/2013, 1:50 PM    Subjective:     Objective Filed Vitals:   11/27/13 0945 11/27/13 1745 11/27/13 2100 11/28/13 0447  BP: 184/89 131/79 173/82 149/79  Pulse: 89 97 90 87  Temp: 97.6 F (36.4 C) 98.3 F (36.8 C) 98.5 F (36.9 C) 97.5 F (36.4 C)  TempSrc: Oral Oral Oral Oral  Resp: 17 17 19 18   Height:      Weight:   73.165 kg (161 lb 4.8 oz)   SpO2: 99% 78% 100% 98%   Physical Exam General: facial puffiness; alert and conversant Heart: RRR 2/6 murmur Lungs: no wheezes or rales Abdomen: obese soft NT Extremities: no si LE edema some right thigh swelling Dialysis Access:  right thigh AVGG  Dialysis Orders:  TTS NW  F160 3h 68min 67.5kg 2/2.5 Bath AVG R thigh Heparin none  Aranesp 60/wk on Tues Hect 5ug TIW Venofer 50 mg q Thurs   Additional Objective Labs: Basic Metabolic Panel:  Recent Labs Lab 11/23/13 0500  11/23/13 0750  11/27/13 0500 11/27/13 2209 11/28/13 0530  NA 128*  < > 127*  < > 134* 131* 134*  K 4.5  < > 4.0  < > 4.4 4.9 5.4*  CL 76*  < > 75*  < > 93* 90* 94*  CO2 11*  < > 10*  < > 29 26 28   GLUCOSE 1107*  < > 1105*  < > 88 268* 119*  BUN 50*  < > 50*  < > 14 23 26*  CREATININE 8.25*  < > 8.36*  < > 3.89* 5.00* 5.68*  CALCIUM 7.1*  < > 7.2*  < > 8.5 9.3 9.1  PHOS 11.9*  --  11.6*  --   --   --   --   < > = values in this interval not displayed. Liver Function Tests:  Recent Labs Lab 11/23/13 0150 11/23/13 0500 11/23/13 0750 11/27/13 0500  AST 71* 130*  --  18  ALT 23 43*  --  14  ALKPHOS 210* 176*  --  152*  BILITOT 0.5 0.3  --  0.3  PROT 7.9 6.3  --  6.8  ALBUMIN 3.5 2.7* 2.8* 2.7*    Recent Labs Lab 11/23/13 0437 11/27/13 0500  LIPASE 34 76*  AMYLASE 273*  --    CBC:  Recent Labs Lab 11/23/13 0150 11/23/13 0332 11/23/13 0500 11/26/13 0205  WBC 13.1*  --  12.7* 6.9  NEUTROABS 11.0*  --   --  4.8  HGB 11.1* 13.6 9.8* 8.7*  HCT 38.7 40.0 31.3* 26.6*  MCV 111.8*  --  103.6* 96.7  PLT 249  --  191 128*   Blood Culture    Component Value Date/Time   SDES BLOOD RIGHT HAND 11/23/2013 0722   SPECREQUEST BOTTLES DRAWN AEROBIC AND ANAEROBIC 10CC BLUE, 6 CC RED 11/23/2013 5427   CULT  Value:        BLOOD CULTURE RECEIVED NO GROWTH TO DATE CULTURE WILL BE HELD FOR 5 DAYS BEFORE ISSUING A FINAL NEGATIVE REPORT Performed at [facility] 11/23/2013 0722   REPTSTATUS PENDING 11/23/2013 0722  CBG:  Recent Labs Lab 11/27/13 2058 11/28/13 0138 11/28/13 0204 11/28/13 0447 11/28/13 0754  GLUCAP 354* 35* 99 97 152*   Iron Studies:  Recent Labs  11/27/13 0500  IRON 36*  TIBC 147*  TRANSFERRIN 103*   FERRITIN 1529*    Studies/Results: Ct Abdomen Pelvis W Contrast  11/26/2013   CLINICAL DATA:  Generalized abdominal pain with nausea, vomiting and diarrhea. History of diabetes and end-stage renal disease on dialysis.  EXAM: CT ABDOMEN AND PELVIS WITH CONTRAST  TECHNIQUE: Multidetector CT imaging of the abdomen and pelvis was performed using the standard protocol following bolus administration of intravenous contrast.  CONTRAST:  79mL OMNIPAQUE IOHEXOL 300 MG/ML  SOLN  COMPARISON:  CT 03/16/2013.  FINDINGS: There is improved aeration of the lung bases. There is mild bibasilar atelectasis and bronchiectasis. Left lower lobe pulmonary veins appear mildly engorged. The heart is enlarged. There is no significant residual pleural effusion. A small hiatal hernia is noted.  There is stable hepatomegaly. Periportal edema has resolved. There is no focal hepatic abnormality. The gallbladder is surgically absent. There is no biliary dilatation. The spleen and pancreas appear normal.  There is no adrenal mass. Both kidneys are atrophied with innumerable cystic lesions. Some of these have enlarged compared with the prior study, notably within the upper and lower poles of the right kidney. No suspicious renal lesions are identified. There is no hydronephrosis or perinephric soft tissue stranding.  The stomach, small bowel, appendix and colon demonstrate no significant findings. 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 There is diffuse atherosclerosis without evidence of large vessel occlusion. The uterus, ovaries and bladder appear unchanged. There are postsurgical changes within the anterior abdominal wall. There are changes within the subcutaneous fat of the anterior abdominal wall consistent subcutaneous injections. Left femoral venous catheter and right groin vascular graft are  noted.  There are no worrisome osseous findings.  IMPRESSION: 1. No acute abdominal pelvic findings. No explanation for the patient's symptoms identified. 2. Innumerable cystic lesions in both kidneys, some increased in size in the interval. No worrisome lesions identified. 3. Stable hepatomegaly.   Electronically Signed   By: Camie Patience M.D.   On: 11/26/2013 19:41   Dg Chest Port 1 View  11/27/2013   CLINICAL DATA:  Increased breast sounds  EXAM: PORTABLE CHEST - 1 VIEW  COMPARISON:  11/23/2013  FINDINGS: The endotracheal tube is been removed. The cardiac shadow is stable. The lungs are well aerated bilaterally. Mild bibasilar opacities are again seen and stable. No new focal abnormality is seen.  IMPRESSION: Stable bibasilar opacities when compared with the prior exam.   Electronically Signed   By: Inez Catalina M.D.   On: 11/27/2013 11:17   Medications:   . antiseptic oral rinse  15 mL Mouth Rinse QID  . aspirin EC  81 mg Oral Daily  . calcium acetate  2,001 mg Oral TID WC  . chlorhexidine  15 mL Mouth Rinse BID  . clonazePAM  0.5 mg Oral QHS  . [START ON 12/01/2013] darbepoetin (ARANESP) injection - DIALYSIS  100 mcg Intravenous Q Tue-HD  . doxercalciferol  5 mcg Intravenous Q T,Th,Sa-HD  . feeding supplement (NEPRO CARB STEADY)  237 mL Oral BID BM  . ferric gluconate (FERRLECIT/NULECIT) IV  62.5 mg Intravenous Q Thu-HD  . FLUoxetine  20 mg Oral QHS  . gabapentin  100 mg Oral Daily  . heparin  5,000 Units Subcutaneous 3 times per day  . insulin aspart  0-2 Units Subcutaneous TID WC  . insulin glargine  3 Units Subcutaneous Daily  . metoCLOPramide (REGLAN) injection  10 mg Intravenous TID AC & HS  . metoprolol succinate  100 mg Oral QPM  . montelukast  10 mg Oral QHS  . pantoprazole  40 mg Oral Daily

## 2013-11-28 NOTE — Clinical Social Work Psychosocial (Signed)
Clinical Social Work Department BRIEF PSYCHOSOCIAL ASSESSMENT 11/28/2013  Patient:  [name], [name]     Account Number:  [id number]     Admit date:  11/23/2013  Clinical Social Worker:  Hubert Azure  Date/Time:  11/28/2013 05:39 PM  Referred by:  Physician  Date Referred:  11/28/2013 Referred for  Homelessness   Other Referral:   Interview type:  Patient Other interview type:    PSYCHOSOCIAL DATA Living Status:  OTHER Admitted from facility:   Level of care:   Primary support name:  Adelei Scobey [phone]) Primary support relationship to patient:  CHILD, ADULT Degree of support available:   Good    CURRENT CONCERNS Current Concerns  Other - See comment   Other Concerns:   Housing issues    SOCIAL WORK ASSESSMENT / PLAN CSW met with patient who was alert and oriented x4. CSW introduced self and explained role. CSW discussed housing situation with patient. Per patient, she was living in a home and landlord told made her aware that section 8 would no longer pay him for her home. CSW asked patient if she still received section 8, patient responded she no longer receives section 8 and she does not know why. Per patient, she was made aware by a case manager at the office, that she failed to bring in required documents regarding her gas bill. Patient stated she also has a 69 year old daughter who is currently residing with a family member. CSW discussed homeless shelters with patient. Patient stated she considered homeless shelters, but cannot stay at one due to her need for dialysis. CSW encouraged to speak with family and friends in order to have shelter. CSW further discussed [employer] with patient. Patient requested information for [employer]   Assessment/plan status:  Information/Referral to [employer] Other assessment/ plan:   Information/referral to community resources:   CSW to provide patient with information  on [employer]    PATIENT'S/FAMILY'S RESPONSE TO PLAN OF CARE: Patient stated she was relieved knowing she could obtain information on other housing options. Patient thanked CSW for assistance.    [place], [place] Weekend Clinical Social Worker [phone]

## 2013-11-29 DIAGNOSIS — N186 End stage renal disease: Secondary | ICD-10-CM

## 2013-11-29 DIAGNOSIS — F3289 Other specified depressive episodes: Secondary | ICD-10-CM

## 2013-11-29 DIAGNOSIS — F329 Major depressive disorder, single episode, unspecified: Secondary | ICD-10-CM

## 2013-11-29 LAB — CULTURE, BLOOD (ROUTINE X 2)
CULTURE: NO GROWTH
Culture: NO GROWTH
Culture: NO GROWTH
Culture: NO GROWTH

## 2013-11-29 LAB — BASIC METABOLIC PANEL
ANION GAP: 14 (ref 5–15)
BUN: 38 mg/dL — ABNORMAL HIGH (ref 6–23)
CHLORIDE: 89 meq/L — AB (ref 96–112)
CO2: 26 mEq/L (ref 19–32)
CREATININE: 7.91 mg/dL — AB (ref 0.50–1.10)
Calcium: 9.5 mg/dL (ref 8.4–10.5)
GFR calc Af Amer: 7 mL/min — ABNORMAL LOW (ref >90)
GFR calc non Af Amer: 6 mL/min — ABNORMAL LOW (ref >90)
GLUCOSE: 344 mg/dL — AB (ref 70–99)
Potassium: 6.4 mEq/L — ABNORMAL HIGH (ref 3.7–5.3)
Sodium: 129 mEq/L — ABNORMAL LOW (ref 137–147)

## 2013-11-29 LAB — CBC
HCT: 26.3 % — ABNORMAL LOW (ref 36.0–46.0)
HEMOGLOBIN: 8.2 g/dL — AB (ref 12.0–15.0)
MCH: 31.4 pg (ref 26.0–34.0)
MCHC: 31.2 g/dL (ref 30.0–36.0)
MCV: 100.8 fL — ABNORMAL HIGH (ref 78.0–100.0)
Platelets: 173 10*3/uL (ref 150–400)
RBC: 2.61 MIL/uL — ABNORMAL LOW (ref 3.87–5.11)
RDW: 15.3 % (ref 11.5–15.5)
WBC: 4.5 10*3/uL (ref 4.0–10.5)

## 2013-11-29 LAB — PHOSPHORUS: PHOSPHORUS: 3.9 mg/dL (ref 2.3–4.6)

## 2013-11-29 LAB — GLUCOSE, CAPILLARY
GLUCOSE-CAPILLARY: 406 mg/dL — AB (ref 70–99)
Glucose-Capillary: 207 mg/dL — ABNORMAL HIGH (ref 70–99)
Glucose-Capillary: 434 mg/dL — ABNORMAL HIGH (ref 70–99)

## 2013-11-29 MED ORDER — ZOLPIDEM TARTRATE 5 MG PO TABS
5.0000 mg | ORAL_TABLET | Freq: Every evening | ORAL | Status: DC | PRN
  Administered 2013-11-29 – 2013-11-30 (×2): 5 mg via ORAL
  Filled 2013-11-29 (×2): qty 1

## 2013-11-29 MED ORDER — ALTEPLASE 2 MG IJ SOLR
2.0000 mg | Freq: Once | INTRAMUSCULAR | Status: AC | PRN
  Filled 2013-11-29: qty 2

## 2013-11-29 MED ORDER — HEPARIN SODIUM (PORCINE) 1000 UNIT/ML DIALYSIS
1000.0000 [IU] | INTRAMUSCULAR | Status: DC | PRN
  Filled 2013-11-29: qty 1

## 2013-11-29 MED ORDER — NEPRO/CARBSTEADY PO LIQD
237.0000 mL | ORAL | Status: DC | PRN
  Administered 2013-11-29: 237 mL via ORAL

## 2013-11-29 MED ORDER — SODIUM CHLORIDE 0.9 % IV SOLN: 100.0000 mL | INTRAVENOUS | Status: DC | PRN

## 2013-11-29 MED ORDER — INSULIN GLARGINE 100 UNIT/ML ~~LOC~~ SOLN
5.0000 [IU] | Freq: Every day | SUBCUTANEOUS | Status: DC
  Filled 2013-11-29: qty 0.05

## 2013-11-29 MED ORDER — LIDOCAINE HCL (PF) 1 % IJ SOLN: 5.0000 mL | INTRAMUSCULAR | Status: DC | PRN

## 2013-11-29 MED ORDER — ONDANSETRON HCL 4 MG/2ML IJ SOLN
INTRAMUSCULAR | Status: AC
  Filled 2013-11-29: qty 2

## 2013-11-29 MED ORDER — LIDOCAINE-PRILOCAINE 2.5-2.5 % EX CREA: 1.0000 "application " | TOPICAL_CREAM | CUTANEOUS | Status: DC | PRN

## 2013-11-29 MED ORDER — INSULIN DETEMIR 100 UNIT/ML ~~LOC~~ SOLN
2.0000 [IU] | Freq: Once | SUBCUTANEOUS | Status: AC
  Administered 2013-11-29: 2 [IU] via SUBCUTANEOUS
  Filled 2013-11-29: qty 0.02

## 2013-11-29 MED ORDER — OXYCODONE-ACETAMINOPHEN 5-325 MG PO TABS
ORAL_TABLET | ORAL | Status: AC
  Filled 2013-11-29: qty 2

## 2013-11-29 MED ORDER — INSULIN GLARGINE 100 UNIT/ML ~~LOC~~ SOLN: 5.0000 [IU] | Freq: Every day | SUBCUTANEOUS | Status: DC

## 2013-11-29 MED ORDER — PENTAFLUOROPROP-TETRAFLUOROETH EX AERO
1.0000 | INHALATION_SPRAY | CUTANEOUS | Status: DC | PRN
Start: 2013-11-29 — End: 2013-12-01

## 2013-11-29 MED ORDER — ASPIRIN-ACETAMINOPHEN-CAFFEINE 250-250-65 MG PO TABS
1.0000 | ORAL_TABLET | Freq: Four times a day (QID) | ORAL | Status: DC | PRN
  Filled 2013-11-29: qty 1

## 2013-11-29 MED ORDER — INSULIN GLARGINE 100 UNIT/ML ~~LOC~~ SOLN: 4.0000 [IU] | Freq: Every day | SUBCUTANEOUS | Status: DC

## 2013-11-29 NOTE — Procedures (Signed)
I was present at this dialysis session, have reviewed the session itself and made  appropriate changes  Kelly Splinter MD (pgr) [phone]    [phone] 11/29/2013, 11:39 AM

## 2013-11-29 NOTE — Clinical Social Work Note (Signed)
CSW continues to follow this patient. CSW provided patient with [employer] information. CSW to continue to be available as needs arise.   [place], [place] Weekend Clinical Social Worker [phone]

## 2013-11-29 NOTE — Progress Notes (Addendum)
[facility] PROGRESS NOTE  [name] ZDG:[id number] DOB: March 05, 1971 DOA: 11/23/2013 PCP: [name], MD  Assessment/Plan  DKA with T1DM, very labile blood sugars.  Gave 4 units of aspart for CBG of 487 and she became hypoglycemic to 35 overnight.  CBG much better controlled today -  Increase Lantus to 5 units -  Continue very low dose SSI -  Liberalized diet -  Continue supplements -  A1c 11.4  Nausea, vomiting and abdominal pain, possibly due to gastroparesis and hepatic/gastric congestion -  Liver function tests:  Improved/back to near baseline -  Lipase mildly elevated, but no evidence of pancreatitis on CT  -  CT abd/pelvis unremarkable -  Start zofran and reglan -  Liberalize diet -  Continue supplements  Headache, resolved with excedrine migraine -   Patient allergic to imitrex, depakote, tramadol -   Continue prn excedrine migraine  Acute respiratory failure due to HCAP, DKA s/p intubation on 6/29 and extubation on 6/30.  May also be somewhat hypervolemic as dry weight is normally 67kg and she is currently 73kg.  May need home oxygen given underlying asthma and emphysema -  Last day of abx given 7/3 which lasted through today -  Walking pulse ox  -  Continue OOB and IS -  BCx NGTD   Hyperkalemia with wide-QRS complex, resolved with HD.  Telemetry demonstrated NSR.  HTN, blood pressure elevated this AM -  Continue metoprolol 100 mg  -  Continue hydralazine prn -  Will not push for lower BPs for now because her BP drops considerably with HD -  Consider norvasc  ESRD, Tues, Thur, Sat schedule.  Hyperkalemia and hyponatremia due to delayed HD -  Per nephrology  Asthma, stable, continue albuterol prn  Metabolic encephalopathy due electrolyte disturbance/hyperkalemia, DKA, resolved.    Right lower extremity swelling:  Duplex neg for DVT.  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  UA not obtained initially and  likely to be negative after 4 days of broad spectrum abx.  Resolved.    Normocytic anemia -  Iron studies c/w chronic disease.  Ferritin markedly elevated -  B12 1485, folate 970 -  Occult stool pending  Diet:  Diabetic/renal Access:  Left femoral CVC IVF: None Proph:  heparin  Code Status: full Family Communication: patient alone Disposition Plan: pending improvement in CBG.   SW consulted and gave information for homeless shelters and patient asking family for assistance     Consultants:  Nephrology  PCCM  Procedures:  6/29 - intubated  6/30 - extubated  Left femoral CVC 6/29  Antibiotics: Aztreonam (PCN allergic) 6/29 >>> 7/3 Vancomycin 6/29 >>> 6/30 Levofloxacin 7/3 x1   HPI/Subjective:  Feeling better.  Still on oxygen.  Eating a little more.    Objective: Filed Vitals:   11/29/13 0800 11/29/13 0830 11/29/13 0845 11/29/13 0900  BP: 97/52 83/50 94/57  91/48  Pulse: 77 76 78 75  Temp:      TempSrc:      Resp:      Height:      Weight:      SpO2:        Intake/Output Summary (Last 24 hours) at 11/29/13 0915 Last data filed at 11/29/13 0300  Gross per 24 hour  Intake    270 ml  Output      0 ml  Net    270 ml   Filed Weights   11/28/13 2344 11/29/13 0437 11/29/13 0710  Weight: 73.392 kg (161 lb 12.8 oz) 73.392 kg (161 lb 12.8 oz) 72.3 kg (159 lb 6.3 oz)    Exam:   General:  BF, No acute distress, looking better today  HEENT:  NCAT, MMM  Cardiovascular:  RRR, nl S1, S2 no mrg, 2+ pulses, warm extremities  Respiratory:  Clear anteriorly, no wheezes or rhonchi, no increased WOB  Abdomen:   NABS, soft, ND, mildly TTP diffusely without rebound or guarding  MSK:   Normal tone and bulk, 1+ bilateral LEE  Neuro:  Grossly intact  Data Reviewed: Basic Metabolic Panel:  Recent Labs Lab 11/23/13 0325  11/23/13 0500  11/23/13 0750  11/26/13 0205 11/27/13 0500 11/27/13 2209 11/28/13 0530 11/29/13 0500 11/29/13 0727  NA  --   < > 128*   < > 127*  < > 135* 134* 131* 134* 129*  --   K  --   < > 4.5  < > 4.0  < > 3.9 4.4 4.9 5.4* 6.4*  --   CL  --   < > 76*  < > 75*  < > 93* 93* 90* 94* 89*  --   CO2  --   < > 11*  < > 10*  < > 26 29 26 28 26   --   GLUCOSE  --   < > 1107*  < > 1105*  < > 33* 88 268* 119* 344*  --   BUN  --   < > 50*  < > 50*  < > 26* 14 23 26* 38*  --   CREATININE  --   < > 8.25*  < > 8.36*  < > 6.07* 3.89* 5.00* 5.68* 7.91*  --   CALCIUM  --   < > 7.1*  < > 7.2*  < > 8.0* 8.5 9.3 9.1 9.5  --   MG 2.4  --   --   --   --   --   --   --   --   --   --   --   PHOS  --   --  11.9*  --  11.6*  --   --   --   --   --   --  3.9  < > = values in this interval not displayed. Liver Function Tests:  Recent Labs Lab 11/23/13 0150 11/23/13 0500 11/23/13 0750 11/27/13 0500  AST 71* 130*  --  18  ALT 23 43*  --  14  ALKPHOS 210* 176*  --  152*  BILITOT 0.5 0.3  --  0.3  PROT 7.9 6.3  --  6.8  ALBUMIN 3.5 2.7* 2.8* 2.7*    Recent Labs Lab 11/23/13 0437 11/27/13 0500  LIPASE 34 76*  AMYLASE 273*  --    No results found for this basename: AMMONIA,  in the last 168 hours CBC:  Recent Labs Lab 11/23/13 0150 11/23/13 0332 11/23/13 0500 11/26/13 0205 11/29/13 0727  WBC 13.1*  --  12.7* 6.9 4.5  NEUTROABS 11.0*  --   --  4.8  --   HGB 11.1* 13.6 9.8* 8.7* 8.2*  HCT 38.7 40.0 31.3* 26.6* 26.3*  MCV 111.8*  --  103.6* 96.7 100.8*  PLT 249  --  191 128* 173   Cardiac Enzymes: No results found for this basename: CKTOTAL, CKMB, CKMBINDEX, TROPONINI,  in the last 168 hours BNP (last 3 results)  Recent Labs  05/03/13 0653 06/27/13 1510  PROBNP 33341.0* 5305.0*   CBG:  Recent Labs Lab 11/28/13 0447 11/28/13 0754 11/28/13 1219 11/28/13 1713 11/28/13 2043  GLUCAP 97 152* 280* 269* 241*    Recent Results (from the past 240 hour(s))  CULTURE, BLOOD (ROUTINE X 2)     Status: None   Collection Time    11/23/13  3:35 AM      Result Value Ref Range Status   Specimen Description BLOOD LEFT FEMORAL  ARTERY CENTRAL LINE   Final   Special Requests BOTTLES DRAWN AEROBIC ONLY 10CC   Final   Culture  Setup Time     Final   Value: 11/23/2013 08:21     Performed at [facility]   Culture     Final   Value:        BLOOD CULTURE RECEIVED NO GROWTH TO DATE CULTURE WILL BE HELD FOR 5 DAYS BEFORE ISSUING A FINAL NEGATIVE REPORT     Performed at [facility]   Report Status PENDING   Incomplete  CULTURE, BLOOD (ROUTINE X 2)     Status: None   Collection Time    11/23/13  3:45 AM      Result Value Ref Range Status   Specimen Description BLOOD LEFT ARM   Final   Special Requests BOTTLES DRAWN AEROBIC AND ANAEROBIC [place] 3CC RED   Final   Culture  Setup Time     Final   Value: 11/23/2013 08:21     Performed at [facility]   Culture     Final   Value:        BLOOD CULTURE RECEIVED NO GROWTH TO DATE CULTURE WILL BE HELD FOR 5 DAYS BEFORE ISSUING A FINAL NEGATIVE REPORT     Performed at [facility]   Report Status PENDING   Incomplete  MRSA PCR SCREENING     Status: Abnormal   Collection Time    11/23/13  5:49 AM      Result Value Ref Range Status   MRSA by PCR POSITIVE (*) NEGATIVE Final   Comment:            The GeneXpert MRSA Assay (FDA     approved for NASAL specimens     only), is one component of a     comprehensive MRSA colonization     surveillance program. It is not     intended to diagnose MRSA     infection nor to guide or     monitor treatment for     MRSA infections.     RESULT CALLED TO, READ BACK BY AND VERIFIED WITH:     CHRIS HAYES,RN [id number] 11/23/13 BY KBARR  CULTURE, BLOOD (ROUTINE X 2)     Status: None   Collection Time    11/23/13  7:00 AM      Result Value Ref Range Status   Specimen Description BLOOD LEFT HAND   Final   Special Requests     Final   Value: BOTTLES DRAWN AEROBIC AND ANAEROBIC 10CC BLUE, 8CC RED   Culture  Setup Time     Final   Value: 11/23/2013 12:20     Performed at [facility]   Culture     Final    Value:        BLOOD CULTURE RECEIVED NO GROWTH TO DATE CULTURE WILL BE HELD FOR 5 DAYS BEFORE ISSUING A FINAL NEGATIVE REPORT     Performed at [facility]   Report Status PENDING   Incomplete  CULTURE,  BLOOD (ROUTINE X 2)     Status: None   Collection Time    11/23/13  7:22 AM      Result Value Ref Range Status   Specimen Description BLOOD RIGHT HAND   Final   Special Requests     Final   Value: BOTTLES DRAWN AEROBIC AND ANAEROBIC 10CC BLUE, 6 CC RED   Culture  Setup Time     Final   Value: 11/23/2013 12:20     Performed at [facility]   Culture     Final   Value:        BLOOD CULTURE RECEIVED NO GROWTH TO DATE CULTURE WILL BE HELD FOR 5 DAYS BEFORE ISSUING A FINAL NEGATIVE REPORT     Performed at [facility]   Report Status PENDING   Incomplete     Studies: Dg Chest Port 1 View  11/27/2013   CLINICAL DATA:  Increased breast sounds  EXAM: PORTABLE CHEST - 1 VIEW  COMPARISON:  11/23/2013  FINDINGS: The endotracheal tube is been removed. The cardiac shadow is stable. The lungs are well aerated bilaterally. Mild bibasilar opacities are again seen and stable. No new focal abnormality is seen.  IMPRESSION: Stable bibasilar opacities when compared with the prior exam.   Electronically Signed   By: Inez Catalina M.D.   On: 11/27/2013 11:17    Scheduled Meds: . aspirin EC  81 mg Oral Daily  . calcium acetate  2,001 mg Oral TID WC  . chlorhexidine  15 mL Mouth Rinse BID  . clonazePAM  0.5 mg Oral QHS  . [START ON 12/01/2013] darbepoetin (ARANESP) injection - DIALYSIS  100 mcg Intravenous Q Tue-HD  . doxercalciferol  5 mcg Intravenous Q T,Th,Sa-HD  . feeding supplement (NEPRO CARB STEADY)  237 mL Oral BID BM  . ferric gluconate (FERRLECIT/NULECIT) IV  62.5 mg Intravenous Q Thu-HD  . FLUoxetine  20 mg Oral QHS  . gabapentin  100 mg Oral Daily  . heparin  5,000 Units Subcutaneous 3 times per day  . insulin aspart  0-2 Units Subcutaneous TID WC  . insulin glargine  3  Units Subcutaneous Daily  . metoCLOPramide (REGLAN) injection  10 mg Intravenous TID AC & HS  . metoprolol succinate  100 mg Oral QPM  . montelukast  10 mg Oral QHS  . pantoprazole  40 mg Oral Daily   Continuous Infusions:   Active Problems:   DKA (diabetic ketoacidoses)   Acute respiratory failure with hypoxia   Hypoglycemia    Time spent: 30 min    [place] Don[place], [place] [facility] Pager [phone]. If 7PM-7AM, please contact night-coverage at [email or web address], password [facility] 11/29/2013, 9:15 AM  LOS: 6 days

## 2013-11-29 NOTE — Progress Notes (Signed)
[facility] Progress Note  Assessment/Plan: 1. DKA/ DM1 - Mgmt per primary. On insulin. 2. ESRD - TTS, HD postponed from Saturday to Sunday due to staffing issues  K up to 6.4 today but BS 300s - K 5.4 yesterday - use 1 K bath today; repeat chemistries in the AM; Qb 400 VP 180 but projected kt/v 1.11 - right leg swelling - no ^ VP BUT given low kt/v and leg swelling looks like she may need an intervention, last done several months ago at [facility]. ?consider this admission or arrange as outpt at [facility] if d/c soon. 3. Anemia - Hgb declining to 8.2   Increased Aranesp to 100 q Tues. Last Tsat 37% on 6/25 outpatient. Follow CBC; note - she is on no heparin HD but getting sub Q heparin here - tells me she is on no heparin HD due to prolonged bleeding. ferritin elevated Fe 36 4. Secondary hyperparathyroidism - . Home Phoslo resumed. Last PTH 374. Continue Vit D. 5. HTN/volume - SBPs drop with full goal - significantly above edw - goal lowered to 4.5 today BPs in 90s, 6. Nutrition - Renal diet, multivit 7. Pruritus - Phos extremely high on n admission with low Ca - back on binders - Ca up to 8.5 as expected P now normal 3.9 be better; on 2.5 Ca bath as an outpt - if she takes binders as Rx doesn't need this - re-evaluate bath a discharge  8. Hx failed KP transplant  9. ^ LFTs -abdom CT neg; or repeat studies LFT have normalized 10. Thrombocytopenia - platelets down to 128 7/2 repeat CBC platelets up to 173 11. Acute resp failure secondary to HCAP - intubated briefly - last levaquin 7/3 - MRSA + bactroban; WBC wnl - still on O2 - doesn't use at home  [name] Jacobson, PA-C [place] [phone] 11/29/2013,8:24 AM  LOS: 6 days   Pt seen, examined and agree w A/P as above. Stable from renal standpoint. Primary team working DM control and living situation.  [name] Splinter MD pager [phone]    cell [phone] 11/29/2013, 11:42 AM   Subjective:   No c/o  Objective Filed  Vitals:   11/29/13 0710 11/29/13 0715 11/29/13 0730 11/29/13 0800  BP: 121/67 126/69 111/62 97/52  Pulse: 80 80 76 77  Temp: 98.5 F (36.9 C)     TempSrc: Oral     Resp: 18     Height:      Weight: 72.3 kg (159 lb 6.3 oz)     SpO2: 98%      Physical Exam   Goal 4.5 General: NAD supine on HD Heart: RRR Lungs: no wheezes or rales Abdomen: obese soft Extremities: right LE swelling Dialysis Access: right thigh AVGG Qb 300   Dialysis Orders: TTS NW F160 3h 60min 67.5kg 2/2.5 Bath AVG R thigh Heparin none  Aranesp 60/wk on Tues Hect 5ug TIW Venofer 50 mg q Thurs    Additional Objective Labs: Basic Metabolic Panel:  Recent Labs Lab 11/23/13 0500  11/23/13 0750  11/27/13 2209 11/28/13 0530 11/29/13 0500 11/29/13 0727  NA 128*  < > 127*  < > 131* 134* 129*  --   K 4.5  < > 4.0  < > 4.9 5.4* 6.4*  --   CL 76*  < > 75*  < > 90* 94* 89*  --   CO2 11*  < > 10*  < > 26 28 26   --   GLUCOSE 1107*  < >  1105*  < > 268* 119* 344*  --   BUN 50*  < > 50*  < > 23 26* 38*  --   CREATININE 8.25*  < > 8.36*  < > 5.00* 5.68* 7.91*  --   CALCIUM 7.1*  < > 7.2*  < > 9.3 9.1 9.5  --   PHOS 11.9*  --  11.6*  --   --   --   --  3.9  < > = values in this interval not displayed. Liver Function Tests:  Recent Labs Lab 11/23/13 0150 11/23/13 0500 11/23/13 0750 11/27/13 0500  AST 71* 130*  --  18  ALT 23 43*  --  14  ALKPHOS 210* 176*  --  152*  BILITOT 0.5 0.3  --  0.3  PROT 7.9 6.3  --  6.8  ALBUMIN 3.5 2.7* 2.8* 2.7*    Recent Labs Lab 11/23/13 0437 11/27/13 0500  LIPASE 34 76*  AMYLASE 273*  --    CBC:  Recent Labs Lab 11/23/13 0150  11/23/13 0500 11/26/13 0205 11/29/13 0727  WBC 13.1*  --  12.7* 6.9 4.5  NEUTROABS 11.0*  --   --  4.8  --   HGB 11.1*  < > 9.8* 8.7* 8.2*  HCT 38.7  < > 31.3* 26.6* 26.3*  MCV 111.8*  --  103.6* 96.7 100.8*  PLT 249  --  191 128* 173  < > = values in this interval not displayed. Blood Culture    Component Value Date/Time   SDES  BLOOD RIGHT HAND 11/23/2013 0722   SPECREQUEST BOTTLES DRAWN AEROBIC AND ANAEROBIC 10CC BLUE, 6 CC RED 11/23/2013 6295   CULT  Value:        BLOOD CULTURE RECEIVED NO GROWTH TO DATE CULTURE WILL BE HELD FOR 5 DAYS BEFORE ISSUING A FINAL NEGATIVE REPORT Performed at [facility] 11/23/2013 0722   REPTSTATUS PENDING 11/23/2013 0722  CBG:  Recent Labs Lab 11/28/13 0447 11/28/13 0754 11/28/13 1219 11/28/13 1713 11/28/13 2043  GLUCAP 97 152* 280* 269* 241*   Iron Studies:  Recent Labs  11/27/13 0500  IRON 36*  TIBC 147*  TRANSFERRIN 103*  FERRITIN 1529*    Studies/Results: Dg Chest Port 1 View  11/27/2013   CLINICAL DATA:  Increased breast sounds  EXAM: PORTABLE CHEST - 1 VIEW  COMPARISON:  11/23/2013  FINDINGS: The endotracheal tube is been removed. The cardiac shadow is stable. The lungs are well aerated bilaterally. Mild bibasilar opacities are again seen and stable. No new focal abnormality is seen.  IMPRESSION: Stable bibasilar opacities when compared with the prior exam.   Electronically Signed   By: Inez Catalina M.D.   On: 11/27/2013 11:17   Medications:   . aspirin EC  81 mg Oral Daily  . calcium acetate  2,001 mg Oral TID WC  . chlorhexidine  15 mL Mouth Rinse BID  . clonazePAM  0.5 mg Oral QHS  . [START ON 12/01/2013] darbepoetin (ARANESP) injection - DIALYSIS  100 mcg Intravenous Q Tue-HD  . doxercalciferol  5 mcg Intravenous Q T,Th,Sa-HD  . feeding supplement (NEPRO CARB STEADY)  237 mL Oral BID BM  . ferric gluconate (FERRLECIT/NULECIT) IV  62.5 mg Intravenous Q Thu-HD  . FLUoxetine  20 mg Oral QHS  . gabapentin  100 mg Oral Daily  . heparin  5,000 Units Subcutaneous 3 times per day  . insulin aspart  0-2 Units Subcutaneous TID WC  . insulin glargine  3 Units Subcutaneous  Daily  . metoCLOPramide (REGLAN) injection  10 mg Intravenous TID AC & HS  . metoprolol succinate  100 mg Oral QPM  . montelukast  10 mg Oral QHS  . pantoprazole  40 mg Oral Daily

## 2013-11-29 NOTE — Progress Notes (Signed)
Inpatient Diabetes Program Recommendations  AACE/ADA: New Consensus Statement on Inpatient Glycemic Control (2013)  Target Ranges:  Prepandial:   less than 140 mg/dL      Peak postprandial:   less than 180 mg/dL (1-2 hours)      Critically ill patients:  140 - 180 mg/dL   Reason for Visit: Labile Blood Sugars  Results for [name], [name] (MRN [id number]) as of 11/29/2013 14:53  Ref. Range 11/28/2013 07:54 11/28/2013 12:19 11/28/2013 17:13 11/28/2013 20:43 11/29/2013 12:24  Glucose-Capillary Latest Range: 70-99 mg/dL 152 (H) 280 (H) 269 (H) 241 (H) 207 (H)   Results for [name], [name] (MRN [id number]) as of 11/29/2013 14:53  Ref. Range 11/29/2013 05:00  Glucose Latest Range: 70-99 mg/dL 344 (H)   FBS elevated in 300s.  May benefit from slight increase in basal insulin.  Inpatient Diabetes Program Recommendations Insulin - Basal: Consider increasing Lantus to 5 units QAM  Note: Will follow daily. Thank you. Lorenda Peck, RD, LDN, CDE Inpatient Diabetes Coordinator [phone]

## 2013-11-30 LAB — GLUCOSE, CAPILLARY
GLUCOSE-CAPILLARY: 451 mg/dL — AB (ref 70–99)
GLUCOSE-CAPILLARY: 67 mg/dL — AB (ref 70–99)
Glucose-Capillary: 551 mg/dL (ref 70–99)
Glucose-Capillary: 319 mg/dL — ABNORMAL HIGH (ref 70–99)
Glucose-Capillary: 53 mg/dL — ABNORMAL LOW (ref 70–99)
Glucose-Capillary: 73 mg/dL (ref 70–99)
Glucose-Capillary: 273 mg/dL — ABNORMAL HIGH (ref 70–99)
Glucose-Capillary: 390 mg/dL — ABNORMAL HIGH (ref 70–99)

## 2013-11-30 LAB — CBC
HCT: 27.2 % — ABNORMAL LOW (ref 36.0–46.0)
Hemoglobin: 8.3 g/dL — ABNORMAL LOW (ref 12.0–15.0)
MCH: 31.3 pg (ref 26.0–34.0)
MCHC: 30.5 g/dL (ref 30.0–36.0)
MCV: 102.6 fL — ABNORMAL HIGH (ref 78.0–100.0)
Platelets: 205 10*3/uL (ref 150–400)
RBC: 2.65 MIL/uL — ABNORMAL LOW (ref 3.87–5.11)
RDW: 15.7 % — ABNORMAL HIGH (ref 11.5–15.5)
WBC: 5.3 10*3/uL (ref 4.0–10.5)

## 2013-11-30 LAB — BASIC METABOLIC PANEL
Anion gap: 13 (ref 5–15)
BUN: 26 mg/dL — ABNORMAL HIGH (ref 6–23)
CHLORIDE: 89 meq/L — AB (ref 96–112)
CO2: 28 mEq/L (ref 19–32)
Calcium: 9.1 mg/dL (ref 8.4–10.5)
Creatinine, Ser: 5.55 mg/dL — ABNORMAL HIGH (ref 0.50–1.10)
GFR, EST AFRICAN AMERICAN: 10 mL/min — AB (ref >90)
GFR, EST NON AFRICAN AMERICAN: 9 mL/min — AB (ref >90)
Glucose, Bld: 470 mg/dL — ABNORMAL HIGH (ref 70–99)
POTASSIUM: 5.1 meq/L (ref 3.7–5.3)
SODIUM: 130 meq/L — AB (ref 137–147)

## 2013-11-30 MED ORDER — ERYTHROMYCIN BASE 250 MG PO TBEC
250.0000 mg | DELAYED_RELEASE_TABLET | Freq: Two times a day (BID) | ORAL | Status: DC
  Filled 2013-11-30 (×2): qty 1

## 2013-11-30 MED ORDER — INSULIN ASPART 100 UNIT/ML ~~LOC~~ SOLN
5.0000 [IU] | Freq: Once | SUBCUTANEOUS | Status: AC
  Administered 2013-11-30: 5 [IU] via SUBCUTANEOUS

## 2013-11-30 MED ORDER — INSULIN ASPART 100 UNIT/ML ~~LOC~~ SOLN: 0.0000 [IU] | Freq: Three times a day (TID) | SUBCUTANEOUS | Status: DC

## 2013-11-30 MED ORDER — INSULIN ASPART 100 UNIT/ML ~~LOC~~ SOLN
3.0000 [IU] | Freq: Two times a day (BID) | SUBCUTANEOUS | Status: DC
  Administered 2013-12-01: 3 [IU] via SUBCUTANEOUS

## 2013-11-30 MED ORDER — INSULIN ASPART 100 UNIT/ML ~~LOC~~ SOLN
0.0000 [IU] | Freq: Three times a day (TID) | SUBCUTANEOUS | Status: DC
  Administered 2013-11-30: 4 [IU] via SUBCUTANEOUS

## 2013-11-30 MED ORDER — GLUCAGON HCL RDNA (DIAGNOSTIC) 1 MG IJ SOLR: 1.0000 mg | Freq: Once | INTRAMUSCULAR | Status: AC | PRN

## 2013-11-30 MED ORDER — INSULIN GLARGINE 100 UNIT/ML ~~LOC~~ SOLN
7.0000 [IU] | Freq: Every day | SUBCUTANEOUS | Status: DC
  Administered 2013-11-30 – 2013-12-01 (×2): 7 [IU] via SUBCUTANEOUS
  Filled 2013-11-30 (×2): qty 0.07

## 2013-11-30 MED ORDER — ONDANSETRON HCL 4 MG/2ML IJ SOLN
INTRAMUSCULAR | Status: AC
  Administered 2013-11-30: 4 mg via INTRAVENOUS
  Filled 2013-11-30: qty 2

## 2013-11-30 MED ORDER — ONDANSETRON 4 MG PO TBDP
4.0000 mg | ORAL_TABLET | Freq: Four times a day (QID) | ORAL | Status: DC | PRN
  Filled 2013-11-30: qty 1

## 2013-11-30 MED ORDER — METOCLOPRAMIDE HCL 10 MG PO TABS
5.0000 mg | ORAL_TABLET | Freq: Three times a day (TID) | ORAL | Status: DC
  Administered 2013-12-01 (×3): 5 mg via ORAL
  Filled 2013-11-30 (×5): qty 0.5
  Filled 2013-11-30: qty 1

## 2013-11-30 NOTE — Progress Notes (Signed)
CBG 551.  Dr. Ernestina Patches notified.  Order received for Novolog 5 units.  Will continue to monitor.  Jodell Cipro

## 2013-11-30 NOTE — Procedures (Signed)
Tolerating hemodialysis. Attempting volume removal. Stable hemodynamics so far. [name] C

## 2013-11-30 NOTE — Progress Notes (Signed)
Assessment/Plan:  1. DKA/ DM1 - Mgmt per primary. On insulin. 2. ESRD - TTS, HD postponed from Saturday to Sunday due to staffing issues K up to 6.4.  Still appears overloaded. Will do HD again today. 3. Anemia - Hgb declining to 8.2 Increased Aranesp to 100 q Tues. Last Tsat 37% on 6/25  - tells me she is on no heparin HD due to prolonged bleeding. ferritin elevated Fe 36 4. Secondary hyperparathyroidism - . Home Phoslo resumed. Last PTH 374. Continue Vit D. 5. HTN/volume - SBPs drop with full goal - above edw - goal lowered to 4.5 today BPs in 90s, 6. Acute resp failure secondary to HCAP - intubated briefly - last levaquin 7/3 - MRSA + bactroban; WBC wnl - still on O2 - doesn't use at home  Subjective: Interval History: Still with high BS  Objective: Vital signs in last 24 hours: Temp:  [97.7 F (36.5 [name])-98.8 F (37.1 [name])] 98.8 F (37.1 [name]) (07/06 0500) Pulse Rate:  [77-92] 77 (07/06 0500) Resp:  [17-18] 18 (07/06 0500) BP: (85-148)/(46-70) 85/46 mmHg (07/06 0500) SpO2:  [88 %-98 %] 98 % (07/06 0500) Weight:  [69.1 kg (152 lb 5.4 oz)] 69.1 kg (152 lb 5.4 oz) (07/05 2100) Weight change: -1.092 kg (-2 lb 6.5 oz)  Intake/Output from previous day: 07/05 0701 - 07/06 0700 In: 30 [I.V.:30] Out: 3336  Intake/Output this shift:    General appearance: alert and cooperative Head: Normocephalic, without obvious abnormality, atraumatic Resp: clear to auscultation bilaterally Chest wall: no tenderness Cardio: regular rate and rhythm, S1, S2 normal, no murmur, click, rub or gallop Extremities: edema 1-2+ on r, 1+ on l  Lab Results:  Recent Labs  11/29/13 0727 11/30/13 0540  WBC 4.5 5.3  HGB 8.2* 8.3*  HCT 26.3* 27.2*  PLT 173 205   BMET:  Recent Labs  11/29/13 0500 11/30/13 0540  NA 129* 130*  K 6.4* 5.1  CL 89* 89*  CO2 26 28  GLUCOSE 344* 470*  BUN 38* 26*  CREATININE 7.91* 5.55*  CALCIUM 9.5 9.1   No results found for this basename: PTH,  in the last 72 hours Iron  Studies: No results found for this basename: IRON, TIBC, TRANSFERRIN, FERRITIN,  in the last 72 hours Studies/Results: No results found.  Scheduled: . aspirin EC  81 mg Oral Daily  . calcium acetate  2,001 mg Oral TID WC  . chlorhexidine  15 mL Mouth Rinse BID  . clonazePAM  0.5 mg Oral QHS  . [START ON 12/01/2013] darbepoetin (ARANESP) injection - DIALYSIS  100 mcg Intravenous Q Tue-HD  . doxercalciferol  5 mcg Intravenous Q T,Th,Sa-HD  . feeding supplement (NEPRO CARB STEADY)  237 mL Oral BID BM  . ferric gluconate (FERRLECIT/NULECIT) IV  62.5 mg Intravenous Q Thu-HD  . FLUoxetine  20 mg Oral QHS  . gabapentin  100 mg Oral Daily  . heparin  5,000 Units Subcutaneous 3 times per day  . insulin aspart  0-5 Units Subcutaneous TID WC  . insulin glargine  7 Units Subcutaneous Daily  . metoCLOPramide (REGLAN) injection  10 mg Intravenous TID AC & HS  . metoprolol succinate  100 mg Oral QPM  . montelukast  10 mg Oral QHS  . pantoprazole  40 mg Oral Daily     LOS: 7 days   [name] 11/30/2013,10:05 AM

## 2013-11-30 NOTE — Progress Notes (Signed)
Hypoglycemic Event  CBG: 53  Treatment: 15 GM carbohydrate snack  Symptoms: Shaky  Follow-up CBG: Time:1250 CBG Result: 67  Possible Reasons for Event: Inadequate meal intake and Medication regimen: changes in insulin dosages  Hypoglycemic Event  CBG: 67  Treatment: 15 GM carbohydrate snack  Symptoms: Shaky  Follow-up CBG: Time: 1415 CBG Result: 73   Possible Reasons for Event: Inadequate meal intake  Comments/MD notified:no     Cheree Ditto D  Remember to initiate Hypoglycemia Order Set & complete  Comments/MD notified: no    Lovette Cliche  Remember to initiate Hypoglycemia Order Set & complete

## 2013-11-30 NOTE — Progress Notes (Signed)
CBG 434.  Dr. Ernestina Patches notified.  No new orders received.  Levimir 2 units given per order.  Will continue to monitor.  Jodell Cipro

## 2013-11-30 NOTE — Progress Notes (Signed)
[facility] PROGRESS NOTE  [name] HFW:[id number] DOB: 1970-06-24 DOA: 11/23/2013 PCP: [name], MD  Assessment/Plan  DKA with T1DM, very labile blood sugars.  Gave 4 units of aspart for CBG of 487 and she became hypoglycemic to 35, but later received 4 units of aspart for similar CBG and she rose to 500s. -  Continue Lantus to 7 units -  Change to aspart 3 units with breakfast and dinner, starting tomorrow -  Liberalized diet -  Continue supplements -  A1c 11.4  Nausea, vomiting and abdominal pain, possibly due to gastroparesis and hepatic/gastric congestion -  Liver function tests:  Improved/back to near baseline -  Lipase mildly elevated, but no evidence of pancreatitis on CT  -  CT abd/pelvis unremarkable -  Continue zofran and reglan but change to PO -  Liberalize diet -  Continue supplements -  Trial of erythromycin 250mg  AC -   Continue PPI  Headache, resolved with excedrine migraine -   Patient allergic to imitrex, depakote, tramadol -   Continue prn excedrine migraine  Acute respiratory failure due to HCAP, DKA s/p intubation on 6/29 and extubation on 6/30.  May also be somewhat hypervolemic as dry weight is normally 67kg and she is currently 73kg.  May need home oxygen given underlying asthma and emphysema -  Last day of abx given 7/3 which lasted through 7/5 -  Walking pulse ox  -  Continue OOB and IS -  BCx NGTD   Hyperkalemia with wide-QRS complex, resolved with HD.  Telemetry demonstrated NSR.  HTN, blood pressures also labile.  Will not push for lower BPs for now because her BP drops considerably with HD -  Continue metoprolol 100 mg  -  Continue hydralazine prn -  Consider norvasc  ESRD, Tues, Thur, Sat schedule.  Hyperkalemia and hyponatremia due to delayed HD -  Per nephrology  Asthma, stable, continue albuterol prn  Metabolic encephalopathy due electrolyte disturbance/hyperkalemia, DKA, resolved.    Right lower extremity  swelling:  Duplex neg for DVT.  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  UA not obtained initially and likely to be negative after 4 days of broad spectrum abx.  Resolved.    Normocytic anemia -  Iron studies c/w chronic disease.  Ferritin markedly elevated -  B12 1485, folate 970 -  Occult stool pending  Diet:  Diabetic/renal Access:  Left femoral CVC IVF: None Proph:  heparin  Code Status: full Family Communication: patient alone Disposition Plan:  pending improvement in CBG.   SW consulted and gave information for homeless shelters and patient asking family for assistance. PT assess for walking pulse ox for home oxygen.   Consultants:  Nephrology  PCCM  Procedures:  6/29 - intubated  6/30 - extubated  Left femoral CVC 6/29 > 7/6  Antibiotics: Aztreonam (PCN allergic) 6/29 >>> 7/3 Vancomycin 6/29 >>> 6/30 Levofloxacin 7/3 x1   HPI/Subjective:  Feeling better.  Still on oxygen.  Eating a little more.    Objective: Filed Vitals:   11/29/13 1712 11/29/13 2100 11/30/13 0500 11/30/13 1000  BP: 127/70 148/70 85/46 149/89  Pulse: 87 92 77 102  Temp: 97.9 F (36.6 C) 98.6 F (37 C) 98.8 F (37.1 C) 98.2 F (36.8 C)  TempSrc: Oral Oral Oral Oral  Resp: 17 18 18 18   Height:      Weight:  69.1 kg (152 lb 5.4 oz)    SpO2: 88% 98% 98% 97%  Intake/Output Summary (Last 24 hours) at 11/30/13 1247 Last data filed at 11/30/13 0900  Gross per 24 hour  Intake    270 ml  Output      0 ml  Net    270 ml   Filed Weights   11/29/13 0710 11/29/13 1015 11/29/13 2100  Weight: 72.3 kg (159 lb 6.3 oz) 69.1 kg (152 lb 5.4 oz) 69.1 kg (152 lb 5.4 oz)    Exam:   General:  BF, No acute distress  HEENT:  NCAT, MMM  Cardiovascular:  RRR, nl S1, S2 no mrg, 2+ pulses, warm extremities  Respiratory:  Clear anteriorly, no wheezes or rhonchi, no increased WOB  Abdomen:   NABS, soft, ND, nontender  MSK:   Normal tone and bulk, 1+ RLE  edema, none left  Neuro:  Grossly intact  Data Reviewed: Basic Metabolic Panel:  Recent Labs Lab 11/27/13 0500 11/27/13 2209 11/28/13 0530 11/29/13 0500 11/29/13 0727 11/30/13 0540  NA 134* 131* 134* 129*  --  130*  K 4.4 4.9 5.4* 6.4*  --  5.1  CL 93* 90* 94* 89*  --  89*  CO2 29 26 28 26   --  28  GLUCOSE 88 268* 119* 344*  --  470*  BUN 14 23 26* 38*  --  26*  CREATININE 3.89* 5.00* 5.68* 7.91*  --  5.55*  CALCIUM 8.5 9.3 9.1 9.5  --  9.1  PHOS  --   --   --   --  3.9  --    Liver Function Tests:  Recent Labs Lab 11/27/13 0500  AST 18  ALT 14  ALKPHOS 152*  BILITOT 0.3  PROT 6.8  ALBUMIN 2.7*    Recent Labs Lab 11/27/13 0500  LIPASE 76*   No results found for this basename: AMMONIA,  in the last 168 hours CBC:  Recent Labs Lab 11/26/13 0205 11/29/13 0727 11/30/13 0540  WBC 6.9 4.5 5.3  NEUTROABS 4.8  --   --   HGB 8.7* 8.2* 8.3*  HCT 26.6* 26.3* 27.2*  MCV 96.7 100.8* 102.6*  PLT 128* 173 205   Cardiac Enzymes: No results found for this basename: CKTOTAL, CKMB, CKMBINDEX, TROPONINI,  in the last 168 hours BNP (last 3 results)  Recent Labs  05/03/13 0653 06/27/13 1510  PROBNP 33341.0* 5305.0*   CBG:  Recent Labs Lab 11/29/13 2209 11/30/13 0328 11/30/13 0508 11/30/13 0744 11/30/13 1210  GLUCAP 434* 551* 451* 319* 53*    Recent Results (from the past 240 hour(s))  CULTURE, BLOOD (ROUTINE X 2)     Status: None   Collection Time    11/23/13  3:35 AM      Result Value Ref Range Status   Specimen Description BLOOD LEFT FEMORAL ARTERY CENTRAL LINE   Final   Special Requests BOTTLES DRAWN AEROBIC ONLY 10CC   Final   Culture  Setup Time     Final   Value: 11/23/2013 08:21     Performed at [place]     Final   Value: NO GROWTH 5 DAYS     Performed at [facility]   Report Status 11/29/2013 FINAL   Final  CULTURE, BLOOD (ROUTINE X 2)     Status: None   Collection Time    11/23/13  3:45 AM      Result  Value Ref Range Status   Specimen Description BLOOD LEFT ARM   Final   Special Requests BOTTLES DRAWN AEROBIC  AND ANAEROBIC [place] 3CC RED   Final   Culture  Setup Time     Final   Value: 11/23/2013 08:21     Performed at [facility]   Culture     Final   Value: NO GROWTH 5 DAYS     Performed at [facility]   Report Status 11/29/2013 FINAL   Final  MRSA PCR SCREENING     Status: Abnormal   Collection Time    11/23/13  5:49 AM      Result Value Ref Range Status   MRSA by PCR POSITIVE (*) NEGATIVE Final   Comment:            The GeneXpert MRSA Assay (FDA     approved for NASAL specimens     only), is one component of a     comprehensive MRSA colonization     surveillance program. It is not     intended to diagnose MRSA     infection nor to guide or     monitor treatment for     MRSA infections.     RESULT CALLED TO, READ BACK BY AND VERIFIED WITH:     CHRIS HAYES,RN [id number] 11/23/13 BY KBARR  CULTURE, BLOOD (ROUTINE X 2)     Status: None   Collection Time    11/23/13  7:00 AM      Result Value Ref Range Status   Specimen Description BLOOD LEFT HAND   Final   Special Requests     Final   Value: BOTTLES DRAWN AEROBIC AND ANAEROBIC 10CC BLUE, 8CC RED   Culture  Setup Time     Final   Value: 11/23/2013 12:20     Performed at [facility]   Culture     Final   Value: NO GROWTH 5 DAYS     Performed at [facility]   Report Status 11/29/2013 FINAL   Final  CULTURE, BLOOD (ROUTINE X 2)     Status: None   Collection Time    11/23/13  7:22 AM      Result Value Ref Range Status   Specimen Description BLOOD RIGHT HAND   Final   Special Requests     Final   Value: BOTTLES DRAWN AEROBIC AND ANAEROBIC 10CC BLUE, 6 CC RED   Culture  Setup Time     Final   Value: 11/23/2013 12:20     Performed at [facility]   Culture     Final   Value: NO GROWTH 5 DAYS     Performed at [facility]   Report Status 11/29/2013 FINAL   Final      Studies: No results found.  Scheduled Meds: . aspirin EC  81 mg Oral Daily  . calcium acetate  2,001 mg Oral TID WC  . chlorhexidine  15 mL Mouth Rinse BID  . clonazePAM  0.5 mg Oral QHS  . [START ON 12/01/2013] darbepoetin (ARANESP) injection - DIALYSIS  100 mcg Intravenous Q Tue-HD  . doxercalciferol  5 mcg Intravenous Q T,Th,Sa-HD  . feeding supplement (NEPRO CARB STEADY)  237 mL Oral BID BM  . ferric gluconate (FERRLECIT/NULECIT) IV  62.5 mg Intravenous Q Thu-HD  . FLUoxetine  20 mg Oral QHS  . gabapentin  100 mg Oral Daily  . heparin  5,000 Units Subcutaneous 3 times per day  . insulin aspart  0-4 Units Subcutaneous TID WC  . insulin glargine  7 Units Subcutaneous Daily  .  metoCLOPramide (REGLAN) injection  10 mg Intravenous TID AC & HS  . metoprolol succinate  100 mg Oral QPM  . montelukast  10 mg Oral QHS  . pantoprazole  40 mg Oral Daily   Continuous Infusions:   Active Problems:   DKA (diabetic ketoacidoses)   Acute respiratory failure with hypoxia   Hypoglycemia    Time spent: 30 min    Forest Pruden, [facility] Pager [phone]. If 7PM-7AM, please contact night-coverage at [email or web address], password [facility] 11/30/2013, 12:47 PM  LOS: 7 days

## 2013-11-30 NOTE — Progress Notes (Addendum)
Inpatient Diabetes Program Recommendations  AACE/ADA: New Consensus Statement on Inpatient Glycemic Control (2013)  Target Ranges:  Prepandial:   less than 140 mg/dL      Peak postprandial:   less than 180 mg/dL (1-2 hours)      Critically ill patients:  140 - 180 mg/dL   Reason for Assessment: Follow-up from consult ordered 11/29/2013   Diabetes history: Type 1 diabetes complicated by ESRD Outpatient Diabetes medications: Lantus 7 units bid and Novolog 6 units bid- at 11 am meal and 7 pm meal Current orders for Inpatient glycemic control: Lantus 7 units, Novolog per custom correction scale beginning with 1 unit at 201 mg/dl up to 4 units max  Note:  Patient hyperglycemic yesterday with glucose ranging from 344 to 434 mg/dl.  Received Lantus 3 units at 10:45, Novolog 1 unit at 12:49, Novolog 2 units at 17:51, Levemir 2 units at 23:11, Novolog 5 units at 03:43, Novolog 4 units at 08:00, Lantus 7 units at 10:56 and by noon CBG was 53.  (Suspect stacking of Novolog received at around 4 am and 8 am to be the main culprits.)  Dialysis today will also contribute to lower CBG's.  Agree with Lantus 7 units daily-- which is half of her home dose.  Suspect that by tomorrow, patient would benefit from very low dose meal coverage.  Talked with patient while she is in dialysis this afternoon.  She says her blood sugar is "always messed up" in the hospital.  Was glad she got more Lantus today.  She thinks taking the Lantus once daily here in the hospital will probably work out.  At home she takes Novolog 6 units twice daily at an 11 am meal and a 7 pm meal.  States she can't eat three meals a day like she is expected to do in the hospital.  Thinks that if she took about half, or 3 units Novolog as meal coverage with an early lunch about 11- that would work out ok.  She would then like to save her supper tray until about 7 pm, then take another 3 units meal coverage then.    States she was supposed to see Dr.  Elyse Hsu last Monday about her diabetes, but was in the hospital.  Someone had told her that he would be called to see her here while she is an inpatient, so I told her I would include this information in her note.    Patient states her blood glucose meter is not working properly and she needs a new one.  Has had current meter "a long time."  Request MD give patient a Rx for an Accu Chek Aviva glucose meter, test strips, and lancets at discharge. Thank you.  Virlee Stroschein S. Marcelline Mates, RN, CNS, CDE Inpatient Diabetes Program, team pager [phone]  Addendum:  Nurse tech had mentioned to me that patient doesn't usually eat anything until late morning-- which confirms what patient told me regarding her preference. Thank you.  Lakindra Wible S. Marcelline Mates, RN, CNS, CDE Inpatient Diabetes Program, team pager [phone]

## 2013-12-01 DIAGNOSIS — R6521 Severe sepsis with septic shock: Secondary | ICD-10-CM

## 2013-12-01 DIAGNOSIS — K3184 Gastroparesis: Secondary | ICD-10-CM

## 2013-12-01 DIAGNOSIS — R600 Localized edema: Secondary | ICD-10-CM

## 2013-12-01 DIAGNOSIS — A419 Sepsis, unspecified organism: Secondary | ICD-10-CM

## 2013-12-01 DIAGNOSIS — J9621 Acute and chronic respiratory failure with hypoxia: Secondary | ICD-10-CM

## 2013-12-01 DIAGNOSIS — E875 Hyperkalemia: Secondary | ICD-10-CM

## 2013-12-01 LAB — BASIC METABOLIC PANEL
ANION GAP: 17 — AB (ref 5–15)
BUN: 11 mg/dL (ref 6–23)
CALCIUM: 9.8 mg/dL (ref 8.4–10.5)
CO2: 25 mEq/L (ref 19–32)
CREATININE: 4.07 mg/dL — AB (ref 0.50–1.10)
Chloride: 89 mEq/L — ABNORMAL LOW (ref 96–112)
GFR calc Af Amer: 15 mL/min — ABNORMAL LOW (ref >90)
GFR calc non Af Amer: 13 mL/min — ABNORMAL LOW (ref >90)
Glucose, Bld: 398 mg/dL — ABNORMAL HIGH (ref 70–99)
Potassium: 4.2 mEq/L (ref 3.7–5.3)
Sodium: 131 mEq/L — ABNORMAL LOW (ref 137–147)

## 2013-12-01 LAB — GLUCOSE, CAPILLARY
GLUCOSE-CAPILLARY: 426 mg/dL — AB (ref 70–99)
Glucose-Capillary: 413 mg/dL — ABNORMAL HIGH (ref 70–99)
Glucose-Capillary: 413 mg/dL — ABNORMAL HIGH (ref 70–99)

## 2013-12-01 MED ORDER — METOCLOPRAMIDE HCL 5 MG PO TABS: 5.0000 mg | ORAL_TABLET | Freq: Three times a day (TID) | ORAL | Status: DC

## 2013-12-01 MED ORDER — DOXERCALCIFEROL 4 MCG/2ML IV SOLN: 5.0000 ug | Freq: Once | INTRAVENOUS | Status: DC

## 2013-12-01 MED ORDER — ONDANSETRON 4 MG PO TBDP: 4.0000 mg | ORAL_TABLET | Freq: Four times a day (QID) | ORAL | Status: DC | PRN

## 2013-12-01 NOTE — Progress Notes (Addendum)
[facility] PROGRESS NOTE  [name] QPR:[id number] DOB: 07-11-1970 DOA: 11/23/2013 PCP: [name], MD  Assessment/Plan  DKA with T1DM, very labile blood sugars.  Gave 4 units of aspart for CBG of 487 and she became hypoglycemic to 35, but later received 4 units of aspart for similar CBG and she rose to 500s.   -  Continue Lantus to 7 units -  Change to aspart 3 units with breakfast and dinner, starting today -  Liberalized diet -  Continue supplements -  A1c 11.4  Nausea, vomiting and abdominal pain, possibly due to gastroparesis and hepatic/gastric congestion.  Improving with reduction in dry weight.   -  Liver function tests:  Improved/back to near baseline -  Lipase mildly elevated, but no evidence of pancreatitis on CT  -  CT abd/pelvis unremarkable -  Continue zofran and reglan but change to PO -  Liberalize diet -  Continue supplements -   Continue PPI  Headache, resolved with excedrine migraine -   Patient allergic to imitrex, depakote, tramadol -   Continue prn excedrine migraine  Acute on chronic respiratory failure due to HCAP with underlying emphysema and asthma, DKA s/p intubation on 6/29 and extubation on 6/30.  May also be somewhat hypervolemic as dry weight is normally 67kg and she is currently 73kg.  May need home oxygen given underlying asthma and emphysema -  Last day of abx given 7/3 which lasted through 7/5 -  Walking pulse ox  -  Continue OOB and IS -  BCx NGTD  -  Needs home oxygen but does not have stable living environment and until she has housing, she cannot be discharged with oxygen.  Hyperkalemia with wide-QRS complex, resolved with HD.  Telemetry demonstrated NSR.  HTN, blood pressures also labile.  Will not push for lower BPs for now because her BP drops considerably with HD -  Continue metoprolol 100 mg  -  Continue hydralazine prn -  Consider norvasc  ESRD, Tues, Thur, Sat schedule.  Hyperkalemia and hyponatremia due  to delayed HD -  Per nephrology  Asthma, stable, continue albuterol prn  Metabolic encephalopathy due electrolyte disturbance/hyperkalemia, DKA, resolved.    Right lower extremity swelling:  Duplex neg for DVT.  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  UA not obtained initially and likely to be negative after 4 days of broad spectrum abx.  Resolved.    Normocytic anemia -  Iron studies c/w chronic disease.  Ferritin markedly elevated -  B12 1485, folate 970 -  Occult stool pending  Diet:  Diabetic/renal Access:  Left femoral CVC IVF: None Proph:  heparin  Code Status: full Family Communication: patient alone Disposition Plan:  SW consulted to figure out discharge housing so patient can go home with home oxgyen.    Consultants:  Nephrology  PCCM  Procedures:  6/29 - intubated  6/30 - extubated  Left femoral CVC 6/29 > 7/6  Antibiotics: Aztreonam (PCN allergic) 6/29 >>> 7/3 Vancomycin 6/29 >>> 6/30 Levofloxacin 7/3 x1   HPI/Subjective:  Feeling better last few days.  Still on oxygen.  Eating a little more.    Objective: Filed Vitals:   11/30/13 1935 11/30/13 2101 12/01/13 0630 12/01/13 1000  BP: 116/64 122/65 151/78 162/79  Pulse: 84 86 82 81  Temp:  98.2 F (36.8 C) 98.4 F (36.9 C) 98.4 F (36.9 C)  TempSrc:    Oral  Resp: 14 16 16 18   Height:  Weight: 63.9 kg (140 lb 14 oz)     SpO2:  97% 100% 96%    Intake/Output Summary (Last 24 hours) at 12/01/13 1130 Last data filed at 12/01/13 0900  Gross per 24 hour  Intake    720 ml  Output   4054 ml  Net  -3334 ml   Filed Weights   11/29/13 2100 11/30/13 1500 11/30/13 1935  Weight: 69.1 kg (152 lb 5.4 oz) 70.2 kg (154 lb 12.2 oz) 63.9 kg (140 lb 14 oz)    Exam:   General:  BF, No acute distress, smiling and walking in hall with PT  HEENT:  NCAT, MMM  Cardiovascular:  RRR, nl S1, S2 no mrg, 2+ pulses, warm extremities  Respiratory:  Clear anteriorly, no  wheezes or rhonchi, no increased WOB  Abdomen:   NABS, soft, ND, nontender  MSK:   Normal tone and bulk, no LEE  Neuro:  Grossly intact  Data Reviewed: Basic Metabolic Panel:  Recent Labs Lab 11/27/13 0500 11/27/13 2209 11/28/13 0530 11/29/13 0500 11/29/13 0727 11/30/13 0540  NA 134* 131* 134* 129*  --  130*  K 4.4 4.9 5.4* 6.4*  --  5.1  CL 93* 90* 94* 89*  --  89*  CO2 29 26 28 26   --  28  GLUCOSE 88 268* 119* 344*  --  470*  BUN 14 23 26* 38*  --  26*  CREATININE 3.89* 5.00* 5.68* 7.91*  --  5.55*  CALCIUM 8.5 9.3 9.1 9.5  --  9.1  PHOS  --   --   --   --  3.9  --    Liver Function Tests:  Recent Labs Lab 11/27/13 0500  AST 18  ALT 14  ALKPHOS 152*  BILITOT 0.3  PROT 6.8  ALBUMIN 2.7*    Recent Labs Lab 11/27/13 0500  LIPASE 76*   No results found for this basename: AMMONIA,  in the last 168 hours CBC:  Recent Labs Lab 11/26/13 0205 11/29/13 0727 11/30/13 0540  WBC 6.9 4.5 5.3  NEUTROABS 4.8  --   --   HGB 8.7* 8.2* 8.3*  HCT 26.6* 26.3* 27.2*  MCV 96.7 100.8* 102.6*  PLT 128* 173 205   Cardiac Enzymes: No results found for this basename: CKTOTAL, CKMB, CKMBINDEX, TROPONINI,  in the last 168 hours BNP (last 3 results)  Recent Labs  05/03/13 0653 06/27/13 1510  PROBNP 33341.0* 5305.0*   CBG:  Recent Labs Lab 11/30/13 1349 11/30/13 1512 11/30/13 2100 11/30/13 2247 12/01/13 0749  GLUCAP 67* 73 273* 390* 413*    Recent Results (from the past 240 hour(s))  CULTURE, BLOOD (ROUTINE X 2)     Status: None   Collection Time    11/23/13  3:35 AM      Result Value Ref Range Status   Specimen Description BLOOD LEFT FEMORAL ARTERY CENTRAL LINE   Final   Special Requests BOTTLES DRAWN AEROBIC ONLY 10CC   Final   Culture  Setup Time     Final   Value: 11/23/2013 08:21     Performed at [place]     Final   Value: NO GROWTH 5 DAYS     Performed at [facility]   Report Status 11/29/2013 FINAL   Final   CULTURE, BLOOD (ROUTINE X 2)     Status: None   Collection Time    11/23/13  3:45 AM      Result Value Ref Range  Status   Specimen Description BLOOD LEFT ARM   Final   Special Requests BOTTLES DRAWN AEROBIC AND ANAEROBIC [place] 3CC RED   Final   Culture  Setup Time     Final   Value: 11/23/2013 08:21     Performed at [facility]   Culture     Final   Value: NO GROWTH 5 DAYS     Performed at [facility]   Report Status 11/29/2013 FINAL   Final  MRSA PCR SCREENING     Status: Abnormal   Collection Time    11/23/13  5:49 AM      Result Value Ref Range Status   MRSA by PCR POSITIVE (*) NEGATIVE Final   Comment:            The GeneXpert MRSA Assay (FDA     approved for NASAL specimens     only), is one component of a     comprehensive MRSA colonization     surveillance program. It is not     intended to diagnose MRSA     infection nor to guide or     monitor treatment for     MRSA infections.     RESULT CALLED TO, READ BACK BY AND VERIFIED WITH:     CHRIS HAYES,RN [id number] 11/23/13 BY KBARR  CULTURE, BLOOD (ROUTINE X 2)     Status: None   Collection Time    11/23/13  7:00 AM      Result Value Ref Range Status   Specimen Description BLOOD LEFT HAND   Final   Special Requests     Final   Value: BOTTLES DRAWN AEROBIC AND ANAEROBIC 10CC BLUE, 8CC RED   Culture  Setup Time     Final   Value: 11/23/2013 12:20     Performed at [facility]   Culture     Final   Value: NO GROWTH 5 DAYS     Performed at [facility]   Report Status 11/29/2013 FINAL   Final  CULTURE, BLOOD (ROUTINE X 2)     Status: None   Collection Time    11/23/13  7:22 AM      Result Value Ref Range Status   Specimen Description BLOOD RIGHT HAND   Final   Special Requests     Final   Value: BOTTLES DRAWN AEROBIC AND ANAEROBIC 10CC BLUE, 6 CC RED   Culture  Setup Time     Final   Value: 11/23/2013 12:20     Performed at [facility]   Culture     Final   Value: NO GROWTH  5 DAYS     Performed at [facility]   Report Status 11/29/2013 FINAL   Final     Studies: No results found.  Scheduled Meds: . aspirin EC  81 mg Oral Daily  . calcium acetate  2,001 mg Oral TID WC  . chlorhexidine  15 mL Mouth Rinse BID  . clonazePAM  0.5 mg Oral QHS  . darbepoetin (ARANESP) injection - DIALYSIS  100 mcg Intravenous Q Tue-HD  . doxercalciferol  5 mcg Intravenous Q T,Th,Sa-HD  . feeding supplement (NEPRO CARB STEADY)  237 mL Oral BID BM  . ferric gluconate (FERRLECIT/NULECIT) IV  62.5 mg Intravenous Q Thu-HD  . FLUoxetine  20 mg Oral QHS  . gabapentin  100 mg Oral Daily  . heparin  5,000 Units Subcutaneous 3 times per day  . insulin aspart  3 Units Subcutaneous BID AC  . insulin glargine  7 Units Subcutaneous Daily  . metoCLOPramide  5 mg Oral TID AC & HS  . metoprolol succinate  100 mg Oral QPM  . montelukast  10 mg Oral QHS  . pantoprazole  40 mg Oral Daily   Continuous Infusions:   Active Problems:   DKA (diabetic ketoacidoses)   Acute respiratory failure with hypoxia   Hypoglycemia    Time spent: 30 min    Aarion Kittrell, [facility] Pager [phone]. If 7PM-7AM, please contact night-coverage at [email or web address], password [facility] 12/01/2013, 11:30 AM  LOS: 8 days

## 2013-12-01 NOTE — Progress Notes (Signed)
CSW met with patient today to discuss d/c options as patient stated that she was currently homeless.  She states that she and her daughter had planned to move into another house but she was unable to pay the deposits.  She adamantly refuses shelter option and has been given housing authority resources but also declines this. As the conversation progressed- patient asked about getting a hotel room where you "pay by the week" and wanted information on the Extended Stay Hotels in the area.  She also stated that she has a lot of family in [facility] and would consider going there. She has a car, gas and money to pay for a hotel room or to get to [facility]. CSW provided a list of the Extended Stay Hotels and encouraged patient to make phone calls to determine rates etc.  She will require home 02 and RNCM will coordinate this once d/c disposition has been determined.  Lorie Phenix. [place], [place]

## 2013-12-01 NOTE — Progress Notes (Signed)
Assessment/Plan:  1. DKA/ DM1uncontrolled - Mgmt per primary. On insulin. 2. ESRD - TTS, Will do HD Wed, off schedule. (EDW 67.5Kg) Currently 63.9Kg.  Will have new EDW at discharge 3. Anemia - 4. HTN/volume - SBPs drop with full goal - below edw -  5. Acute resp failure secondary to HCAP - intubated briefly - last levaquin 7/3 - MRSA + bactroban;   Subjective: Interval History: -4L with HD, feels better with less dyspnea  Objective: Vital signs in last 24 hours: Temp:  [98.2 F (36.8 C)-98.4 F (36.9 C)] 98.4 F (36.9 C) (07/07 1000) Pulse Rate:  [72-101] 81 (07/07 1000) Resp:  [14-20] 18 (07/07 1000) BP: (92-162)/(37-79) 162/79 mmHg (07/07 1000) SpO2:  [96 %-100 %] 96 % (07/07 1000) Weight:  [63.9 kg (140 lb 14 oz)-70.2 kg (154 lb 12.2 oz)] 63.9 kg (140 lb 14 oz) (07/06 1935) Weight change: -2.1 kg (-4 lb 10.1 oz)  Intake/Output from previous day: 07/06 0701 - 07/07 0700 In: 720 [P.O.:720] Out: 4054  Intake/Output this shift: Total I/O In: 240 [P.O.:240] Out: -   General appearance: alert and cooperative Back: negative, symmetric, no curvature. ROM normal. No CVA tenderness. Chest wall: no tenderness GI: soft, non-tender; bowel sounds normal; no masses,  no organomegaly Extremities: edema tr  Lab Results:  Recent Labs  11/29/13 0727 11/30/13 0540  WBC 4.5 5.3  HGB 8.2* 8.3*  HCT 26.3* 27.2*  PLT 173 205   BMET:  Recent Labs  11/29/13 0500 11/30/13 0540  NA 129* 130*  K 6.4* 5.1  CL 89* 89*  CO2 26 28  GLUCOSE 344* 470*  BUN 38* 26*  CREATININE 7.91* 5.55*  CALCIUM 9.5 9.1   No results found for this basename: PTH,  in the last 72 hours Iron Studies: No results found for this basename: IRON, TIBC, TRANSFERRIN, FERRITIN,  in the last 72 hours Studies/Results: No results found.  Scheduled: . aspirin EC  81 mg Oral Daily  . calcium acetate  2,001 mg Oral TID WC  . chlorhexidine  15 mL Mouth Rinse BID  . clonazePAM  0.5 mg Oral QHS  . darbepoetin  (ARANESP) injection - DIALYSIS  100 mcg Intravenous Q Tue-HD  . doxercalciferol  5 mcg Intravenous Q T,Th,Sa-HD  . feeding supplement (NEPRO CARB STEADY)  237 mL Oral BID BM  . ferric gluconate (FERRLECIT/NULECIT) IV  62.5 mg Intravenous Q Thu-HD  . FLUoxetine  20 mg Oral QHS  . gabapentin  100 mg Oral Daily  . heparin  5,000 Units Subcutaneous 3 times per day  . insulin aspart  3 Units Subcutaneous BID AC  . insulin glargine  7 Units Subcutaneous Daily  . metoCLOPramide  5 mg Oral TID AC & HS  . metoprolol succinate  100 mg Oral QPM  . montelukast  10 mg Oral QHS  . pantoprazole  40 mg Oral Daily     LOS: 8 days   Nataki Mccrumb C 12/01/2013,10:53 AM

## 2013-12-01 NOTE — Progress Notes (Signed)
Inpatient Diabetes Program Recommendations  AACE/ADA: New Consensus Statement on Inpatient Glycemic Control (2013)  Target Ranges:  Prepandial:   less than 140 mg/dL      Peak postprandial:   less than 180 mg/dL (1-2 hours)      Critically ill patients:  140 - 180 mg/dL     Results for [name], [name] (MRN [id number]) as of 12/01/2013 14:18  Ref. Range 11/30/2013 03:28 11/30/2013 05:08 11/30/2013 07:44 11/30/2013 12:10 11/30/2013 13:49 11/30/2013 15:12 11/30/2013 21:00 11/30/2013 22:47  Glucose-Capillary Latest Range: 70-99 mg/dL 551 (HH) 451 (H) 319 (H) 53 (L) 67 (L) 73 273 (H) 390 (H)    Results for [name], [name] (MRN [id number]) as of 12/01/2013 14:18  Ref. Range 12/01/2013 07:49 12/01/2013 11:48  Glucose-Capillary Latest Range: 70-99 mg/dL 413 (H) 426 (H)     **Note patient continues with extremely labile CBGs.  Patient's Lantus increased to 7 units daily yesterday which is 1/2 her scheduled home dose.  **I wonder if patient had more of her home Lantus ordered if we could avoid the extreme hyperglycemia that patient seems to have.  It seems as if most of patient's hypoglycemia may stem from the stacking effect of too much rapid-acting Novolog that she receives when she has extreme hyperglycemia.  At this point, it may be best to aim for CBGs <250 mg/dl and to not aim for tight control.   MD- What do you think about the following?:  1. Change Lantus to 80% of her home dose- Lantus 5 units bid 2. Continue Meal Coverage Novolog 3 units bid with meals 3. Could we try adding a Correction scale back tid before meal times? 250-300 mg/dl 1 unit; 301-350, 2 units, 351-400 3 units   Will follow and attempt to assist with this challenging case.  Wyn Quaker RN, MSN, CDE Diabetes Coordinator Inpatient Diabetes Program Team Pager: [phone] (8a-10p)

## 2013-12-01 NOTE — Evaluation (Signed)
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 Presents to the ED complaining of weakness and dizziness. Found to be on severe DKA. Also severe hyperkalemia with wide QRS complexes. Intubated for airway protection. Extubated 6/30  Clinical Impression   Patient evaluated by Physical Therapy with no further acute PT needs identified. All education has been completed and the patient has no further questions.  See below for any follow-up Physical Therapy or equipment needs. PT is signing off. Thank you for this referral.  Safe to ambulate in hallways with supplemental O2 and staff to monitor sats     Follow Up Recommendations Supervision - Intermittent;No PT follow up (Outpt Pulmonary Rehab)    Equipment Recommendations  None recommended by PT    Recommendations for Other Services Other (comment) (Worth considering Outpt Pulmonary Rehab)     Precautions / Restrictions Precautions Precaution Comments: Watch O2 sats with activity Restrictions Weight Bearing Restrictions: No      Mobility  Bed Mobility Overal bed mobility: Modified Independent                Transfers Overall transfer level: Modified independent Equipment used: None                Ambulation/Gait Ambulation/Gait assistance: Supervision;Modified independent (Device/Increase time) Ambulation Distance (Feet): 200 Feet (greater than) Assistive device: None Gait Pattern/deviations: WFL(Within Functional Limits) Gait velocity: WNL   General Gait Details: Overall walking well; Cues to self-monitor for activity tolerance; Initiated amb on Room air, and O2 sats were unacceptably low; restarted supplemental O2 and sats increased to acceptable levels; required one seated rest  break  Stairs            Wheelchair Mobility    Modified Rankin (Stroke Patients Only)       Balance Overall balance assessment: No apparent balance deficits (not formally assessed)                                           Pertinent Vitals/Pain See other PT note of this date    Home Living Family/patient expects to be discharged to:: Private residence Living Arrangements: Parent;Children Available Help at Discharge: Family;Available PRN/intermittently Type of Home: Other(Comment) (Pt is in transition/moving) Home Access:  (a few steps to enter a home should not pose a problem)              Prior Function Level of Independence: Independent               Hand Dominance        Extremity/Trunk Assessment   Upper Extremity Assessment: Overall WFL for tasks assessed           Lower Extremity Assessment: Overall WFL for tasks assessed         Communication   Communication: No difficulties  Cognition Arousal/Alertness: Awake/alert Behavior During Therapy: WFL for tasks assessed/performed Overall Cognitive Status: Within Functional Limits for tasks assessed                      General Comments      Exercises        Assessment/Plan    PT Assessment  Patent does not need any further PT services  PT Diagnosis     PT Problem List    PT Treatment Interventions     PT Goals (Current goals can be found in the Care Plan section) Acute Rehab PT Goals Patient Stated Goal: Would rather not need supplemental O2 PT Goal Formulation: No goals set, d/c therapy    Frequency     Barriers to discharge        Co-evaluation               End of Session Equipment Utilized During Treatment: Oxygen Activity Tolerance: Patient tolerated treatment well Patient left: in chair;with call bell/phone within reach;Other (comment) (with MD in room) Nurse Communication: Mobility status         Time: 0375-4360 PT Time  Calculation (min): 38 min   Charges:   PT Evaluation $Initial PT Evaluation Tier I: 1 Procedure PT Treatments $Gait Training: 23-37 mins   PT G Codes:          Quin Hoop 12/01/2013, 9:39 AM  Roney Marion, [place] Pager [phone] Office [phone]

## 2013-12-01 NOTE — Progress Notes (Signed)
PT signed d/c papers, prescriptions given. Discussed medication regimens, checking cbgs, s/sx to return for, and follow up appointments. Pt verbalized understanding. D/c'd with DME home O2, pt able to successfully connect to tank, turn on and verbalize turning off. Escorted to car via wheelchair.

## 2013-12-01 NOTE — Care Management Note (Signed)
CARE MANAGEMENT NOTE 12/01/2013  Patient:  [name], [name]   Account Number:  [id number]  Date Initiated:  11/23/2013  Documentation initiated by:  [facility]  Subjective/Objective Assessment:   Admitted with DKA - intubated - on insulin drip.     Action/Plan:   12/01/2013 Pt ready for d/c, will need home oxygen, orders in, however has issues with residence. CSW, Kendell Bane to see pt re d/c needs.  12/01/13 Per CSW pt will d/c to Extended  Stay motel with home oxygen   Anticipated DC Date:  12/02/2013   Anticipated DC Plan:  [place]  CM consult      Choice offered to / List presented to:     DME arranged  [facility]      DME agency  [place].        Status of service:  Completed, signed off Medicare Important Message given?  YES (If response is "NO", the following Medicare IM given date fields will be blank) Date Medicare IM given:  12/01/2013 Medicare IM given by:  Torry Istre Date Additional Medicare IM given:   Additional Medicare IM given by:    Discharge Disposition:  HOME/SELF CARE  Per UR Regulation:  Reviewed for med. necessity/level of care/duration of stay  If discussed at [place] of Stay Meetings, dates discussed:    Comments:  Contact:  Delrae Sawyers [place] [phone]   [phone]                 O'Neal,Zhabriwa Daughter     [phone]

## 2013-12-01 NOTE — Progress Notes (Signed)
Physical Therapy Treatment Note  One-time eval complete with full note to follow;   SATURATION QUALIFICATIONS: (This note is used to comply with regulatory documentation for home oxygen)  Patient Saturations on Room Air at Rest = 86-88%  Patient Saturations on Room Air while Ambulating = 84-88%  Patient Saturations on 2 Liters of oxygen while Ambulating = 95-99%  Please briefly explain why patient needs home oxygen: Patient requires supplemental oxygen to maintain oxygen saturation at safe, acceptable levels during activity.  Thanks,  Roney Marion, [place]  Acute Rehabilitation Services Pager [phone] Office [phone]

## 2013-12-01 NOTE — Discharge Summary (Addendum)
Physician Discharge Summary  [name] AOZ:[id number] DOB: April 23, 1971 DOA: 11/23/2013  PCP: [name], MD  Admit date: 11/23/2013 Discharge date: 12/01/2013  Recommendations for Outpatient Follow-up:  1. Dr. Elyse Hsu, Endocrinology, appt to be rescheduled 2. If eating normally:  Resume previous insulin regimen.  If not eating normally, decrease to lantus 7 units once daily with aspart 3 units twice daily with meals.   3. F/u with primary care doctor as needed 4. Dr. Florene Glen attempting to get her into an additional dialysis session tomorrow, Wed, before resuming her previous TTS schedule.  Her weight on day of discharge is 63.9kg.  Previously recorded dry wt was 67kg. 5. Consider fistula evaluation of right leg fistula due to intermittent swelling of the right leg.   Discharge Diagnoses:  Principal Problem:   Septic shock due to HCAP Active Problems:   ESRD (end stage renal disease)   HCAP (healthcare-associated pneumonia)   DKA (diabetic ketoacidoses)   Acute respiratory failure with hypoxia   Hypoglycemia   Hyperkalemia   Gastroparesis   Edema of lower extremity   Acute on chronic respiratory failure with hypoxia   Discharge Condition: stable, improved  Diet recommendation: renal dialysis/diabetic  Wt Readings from Last 3 Encounters:  11/30/13 63.9 kg (140 lb 14 oz)  10/16/13 68.663 kg (151 lb 6 oz)  10/16/13 68.663 kg (151 lb 6 oz)    History of present illness:  43 years old female with PMH relevant for DM type 1, 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 Presents to the ED complaining of weakness and dizziness. Found to be on severe DKA with pH of 6.9 and initial BS of 1441. Also severe hyperkalemia of 7.7 with wide QRS complexes. Intubated for airway protection. PCCM called to admit. Unfortunately I was unable to obtain more history due to the patient altered mental status and  no family available.   Hospital Course:   Septic shock and acute on chronic hypoxic respiratory failure due to HCAP.  She was intubated on 6/29 and briefly required vasopressors which were discontinued within one day. She was started on broad-spectrum antibiotics for presumed healthcare associated pneumonia.  She improved clinically and was able to be extubated on 6/30.  She completed a seven-day course of antibiotics. Her blood cultures are no growth final. Unfortunately, she still has a slight oxygen requirement of 1 L with ambulation. She was set up for home oxygen. She is encouraged to continue incentive spirometry and to be as active as possible so that eventually she can graduate from using oxygen.    Diabetic ketoacidosis with type 1 diabetes mellitus, very labile blood sugars. A1c of 11.4. She was in DKA initially with a bicarbonate of <7 and anion gap could not be calculated (at least 37).  Her initial BMP was Na 109, K > 7.7, Cl <65, CO2 < 7, glucose 1441.  She remained on insulin infusion for approximately 3 days before she was able to be transitioned to subcutaneous insulin. After transitioning to subcutaneous insulin, she had frequent episodes of hyperglycemia and hypoglycemia.  Her diabetes was somewhat more difficult to manage in the hospital because she was not eating her normal foods, she had poor appetite secondary to nausea and vomiting, and she required intermittent hemodialysis which also decreased her blood sugars. She states that she tends to have better control when she got home, and I have recommended that she continue her previous  home insulin regimen as long as she is eating and drinking normally. She should call her endocrinologist office if she has any questions or concerns. I called and left a message for her endocrinologist to reschedule her missed appointment from last week.  Nausea, vomiting, abdominal pain, thought to be due to gastroparesis and hepatic/gastric congestion  due to hypervolemia. Most likely she received some IV fluids initially during her resuscitation and her dry weight has decreased. Initially, she had some mild elevation of her liver function tests but this declined back to baseline. Her lipase is also mildly elevated but there was no evidence of pancreatitis on CT of abdomen and pelvis. She was started on Reglan and antiemetics which helped, however I think most of her improvement came from decreasing her dry weight with dialysis.  Headache, resolved with Excedrin migraine.  Hyperkalemia due to end-stage renal disease in the setting of DKA with wide QRS complex, resolved with hemodialysis.  Her initial K was > 7.7.    Hypertension, blood pressures labile. Continue metoprolol 100 mg, but did not try to considerably decreased blood pressure because her blood pressure drops with hemodialysis.  End-stage renal disease on a Tuesday, Thursday, Saturday schedule. Her dry weight was decreased which made her feel much better.  Wt on the day of discharge:  63.9kg.    Asthma, stable, continued albuterol when necessary.  Metabolic encephalopathy due to electrolyte disturbance, hyperkalemia, diabetic ketoacidosis, and septic shock, resolved.  Right lower extremity swelling, her duplex was negative for DVT. She needed outpatient evaluation of her right leg fistula.  Leukocytosis, likely secondary to healthcare associated pneumonia. A urinalysis could not be obtained. This resolved with antibiotics.  Normocytic anemia, iron studies consistent with chronic disease. Her ferritin was markedly elevated. Her B12 was 1485, and folate 970.  Occult stool was not obtained.  Consultants:  Nephrology  PCCM Procedures:  6/29 - intubated  6/30 - extubated  Left femoral CVC 6/29 > 7/6  Antibiotics:  Aztreonam (PCN allergic) 6/29 >>> 7/3  Vancomycin 6/29 >>> 6/30  Levofloxacin 7/3 x1   Discharge Exam: Filed Vitals:   12/01/13 1000  BP: 162/79  Pulse: 81   Temp: 98.4 F (36.9 C)  Resp: 18   Filed Vitals:   11/30/13 1935 11/30/13 2101 12/01/13 0630 12/01/13 1000  BP: 116/64 122/65 151/78 162/79  Pulse: 84 86 82 81  Temp:  98.2 F (36.8 C) 98.4 F (36.9 C) 98.4 F (36.9 C)  TempSrc:    Oral  Resp: 14 16 16 18   Height:      Weight: 63.9 kg (140 lb 14 oz)     SpO2:  97% 100% 96%    General: BF, No acute distress, smiling and walking in hall with PT  HEENT: NCAT, MMM  Cardiovascular: RRR, nl S1, S2 no mrg, 2+ pulses, warm extremities  Respiratory: Clear anteriorly, no wheezes or rhonchi, no increased WOB  Abdomen: NABS, soft, ND, nontender  MSK: Normal tone and bulk, no LEE  Neuro: Grossly intact   Discharge Instructions      Discharge Instructions   (HEART FAILURE PATIENTS) Call MD:  Anytime you have any of the following symptoms: 1) 3 pound weight gain in 24 hours or 5 pounds in 1 week 2) shortness of breath, with or without a dry hacking cough 3) swelling in the hands, feet or stomach 4) if you have to sleep on extra pillows at night in order to breathe.    Complete by:  As directed  Call MD for:  difficulty breathing, headache or visual disturbances    Complete by:  As directed      Call MD for:  extreme fatigue    Complete by:  As directed      Call MD for:  hives    Complete by:  As directed      Call MD for:  persistant dizziness or light-headedness    Complete by:  As directed      Call MD for:  persistant nausea and vomiting    Complete by:  As directed      Call MD for:  redness, tenderness, or signs of infection (pain, swelling, redness, odor or green/yellow discharge around incision site)    Complete by:  As directed      Call MD for:  severe uncontrolled pain    Complete by:  As directed      Call MD for:  temperature >100.4    Complete by:  As directed      Diet Carb Modified    Complete by:  As directed      Discharge instructions    Complete by:  As directed   You were hospitalized with septic  shock (critical life-threatening illness) due to pneumonia and with severe diabetic ketoacidosis.  You have completed a course of antibiotics in the hospital, but you still need oxygen for now.  I think you will likely not need your oxygen for long if you continue to be active and use your incentive spirometer.  Please follow up for your regular hemodialysis on Thursday.  Resume your previous insulin regimen as before if you are eating normally.  If you are not eating normally, I would decrease the insulin by 50% to lantus 7 units once daily and aspart 3 units twice daily.  If you have questions, please call Dr. Altheimer's office.  Your appointment with him will be rescheduled.     Increase activity slowly    Complete by:  As directed             Medication List         albuterol 108 (90 BASE) MCG/ACT inhaler  Commonly known as:  PROVENTIL HFA;VENTOLIN HFA  Inhale 2 puffs into the lungs every 4 (four) hours as needed for wheezing or shortness of breath (as per home regimen).     aspirin EC 81 MG tablet  Take 81 mg by mouth daily.     calcium acetate 667 MG capsule  Commonly known as:  PHOSLO  Take 2,001 mg by mouth 3 (three) times daily with meals.     clonazePAM 0.5 MG tablet  Commonly known as:  KLONOPIN  Take 1 mg by mouth at bedtime.     EXFORGE 10-160 MG per tablet  Generic drug:  amLODipine-valsartan  Take 1 tablet by mouth at bedtime. Take on tues,thur, Saturday and sunday     FLUoxetine 20 MG capsule  Commonly known as:  PROZAC  Take 20 mg by mouth at bedtime.     gabapentin 100 MG capsule  Commonly known as:  NEURONTIN  Take 100 mg by mouth daily.     Insulin Glargine 100 UNIT/ML Solostar Pen  Commonly known as:  LANTUS  Inject 7 Units into the skin 2 (two) times daily with a meal.     lidocaine-prilocaine cream  Commonly known as:  EMLA  Apply 1 application topically 2 (two) times a week.     metoCLOPramide 5 MG tablet  Commonly  known as:  REGLAN  Take 1  tablet (5 mg total) by mouth 4 (four) times daily -  before meals and at bedtime.     metoprolol succinate 100 MG 24 hr tablet  Commonly known as:  TOPROL-XL  Take 100 mg by mouth at bedtime. Take with or immediately following a meal.     montelukast 10 MG tablet  Commonly known as:  SINGULAIR  Take 10 mg by mouth at bedtime.     multivitamin Tabs tablet  Take 1 tablet by mouth daily.     NOVOLOG FLEXPEN 100 UNIT/ML FlexPen  Generic drug:  insulin aspart  Inject 6 Units into the skin 2 (two) times daily with a meal.     ondansetron 4 MG disintegrating tablet  Commonly known as:  ZOFRAN-ODT  Take 1 tablet (4 mg total) by mouth every 6 (six) hours as needed for nausea or vomiting.     pantoprazole 40 MG tablet  Commonly known as:  PROTONIX  Take 1 tablet (40 mg total) by mouth at bedtime.     zolpidem 12.5 MG CR tablet  Commonly known as:  AMBIEN CR  Take 12.5 mg by mouth at bedtime.       Follow-up Information   Follow up with [name], Gifford Shave, MD. Schedule an appointment as soon as possible for a visit in 2 weeks.   Specialty:  Family Medicine   Contact information:   [address] [place] [address] [phone]       Follow up with Limmie Patricia, MD On 12/01/2013.   Specialty:  Endocrinology   Contact information:   [place] [address] [phone]        The results of significant diagnostics from this hospitalization (including imaging, microbiology, ancillary and laboratory) are listed below for reference.    Significant Diagnostic Studies: Ct Abdomen Pelvis W Contrast  11/26/2013   CLINICAL DATA:  Generalized abdominal pain with nausea, vomiting and diarrhea. History of diabetes and end-stage renal disease on dialysis.  EXAM: CT ABDOMEN AND PELVIS WITH CONTRAST  TECHNIQUE: Multidetector CT imaging of the abdomen and pelvis was performed using the standard protocol following bolus administration of  intravenous contrast.  CONTRAST:  2mL OMNIPAQUE IOHEXOL 300 MG/ML  SOLN  COMPARISON:  CT 03/16/2013.  FINDINGS: There is improved aeration of the lung bases. There is mild bibasilar atelectasis and bronchiectasis. Left lower lobe pulmonary veins appear mildly engorged. The heart is enlarged. There is no significant residual pleural effusion. A small hiatal hernia is noted.  There is stable hepatomegaly. Periportal edema has resolved. There is no focal hepatic abnormality. The gallbladder is surgically absent. There is no biliary dilatation. The spleen and pancreas appear normal.  There is no adrenal mass. Both kidneys are atrophied with innumerable cystic lesions. Some of these have enlarged compared with the prior study, notably within the upper and lower poles of the right kidney. No suspicious renal lesions are identified. There is no hydronephrosis or perinephric soft tissue stranding.  The stomach, small bowel, appendix and colon demonstrate no significant findings. 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 There is diffuse atherosclerosis without evidence of large vessel occlusion. The uterus, ovaries and bladder appear unchanged. There are postsurgical changes within the anterior abdominal wall. There are changes within the subcutaneous fat of the anterior abdominal wall consistent subcutaneous injections. Left femoral  venous catheter and right groin vascular graft are noted.  There are no worrisome osseous findings.  IMPRESSION: 1. No acute abdominal pelvic findings. No explanation for the patient's symptoms identified. 2. Innumerable cystic lesions in both kidneys, some increased in size in the interval. No worrisome lesions identified. 3. Stable hepatomegaly.   Electronically Signed   By: Camie Patience M.D.   On: 11/26/2013 19:41   Dg Chest Port 1 View  11/27/2013    CLINICAL DATA:  Increased breast sounds  EXAM: PORTABLE CHEST - 1 VIEW  COMPARISON:  11/23/2013  FINDINGS: The endotracheal tube is been removed. The cardiac shadow is stable. The lungs are well aerated bilaterally. Mild bibasilar opacities are again seen and stable. No new focal abnormality is seen.  IMPRESSION: Stable bibasilar opacities when compared with the prior exam.   Electronically Signed   By: Inez Catalina M.D.   On: 11/27/2013 11:17   Dg Chest Portable 1 View  11/23/2013   CLINICAL DATA:  Repositioned endotracheal tube.  EXAM: PORTABLE CHEST - 1 VIEW  COMPARISON:  Chest radiograph November 23, 2013 at 0309 hr and chest radiograph June 27, 2013  FINDINGS: Retracted endotracheal tube, distal tip at the carina. Re-expanded left lung with patchy airspace opacities. No pleural effusions. No pneumothorax  Cardiac silhouette appears mildly enlarged, mediastinal silhouette is nonsuspicious. Multiple EKG lines overlie the patient and may obscure subtle underlying pathology. Soft tissue planes and included osseous structures are unchanged.  IMPRESSION: Retracted endotracheal tube, distal tip at the carina, recommend 2 cm of further retraction.  Re-expanded left lung with patchy airspace opacities, nonspecific.  Mild cardiomegaly.   Electronically Signed   By: Elon Alas   On: 11/23/2013 04:14   Dg Chest Portable 1 View  11/23/2013   CLINICAL DATA:  Emesis  EXAM: PORTABLE CHEST - 1 VIEW  COMPARISON:  06/27/2013  FINDINGS: Right mainstem intubation. The tube would need to be retracted 5 cm to reach the mid thoracic trachea. There is complete collapse of the left lung, with leftward shift of the mediastinum. The tube has been retracted at time of dictation. The heart size is obscured. The right lung is well aerated. No pneumothorax.  IMPRESSION: Right mainstem intubation and left lung collapse.   Electronically Signed   By: Jorje Guild M.D.   On: 11/23/2013 04:01   Dg Abd Portable 1v  11/23/2013    CLINICAL DATA:  Central line placement, emesis.  EXAM: PORTABLE ABDOMEN - 1 VIEW  COMPARISON:  None.  FINDINGS: Left femoral line in place, distal tip projecting over the sacrum.  Bowel gas pattern is nondilated and nonobstructive, with overall paucity of bowel gas. A staple line in the in the right lower quadrant, surgical clips in the right upper quadrant, subcentimeter calcifications in the the left abdomen may are present nephrolithiasis. Soft tissue planes and included osseous structures are nonsuspicious.  : Left femoral line distal tip projects at the sacrum.  Nonspecific bowel gas pattern.   Electronically Signed   By: Elon Alas   On: 11/23/2013 04:13    Microbiology: Recent Results (from the past 240 hour(s))  CULTURE, BLOOD (ROUTINE X 2)     Status: None   Collection Time    11/23/13  3:35 AM      Result Value Ref Range Status   Specimen Description BLOOD LEFT FEMORAL ARTERY CENTRAL LINE   Final   Special Requests BOTTLES DRAWN AEROBIC ONLY 10CC   Final   Culture  Setup Time  Final   Value: 11/23/2013 08:21     Performed at [facility]   Culture     Final   Value: NO GROWTH 5 DAYS     Performed at [facility]   Report Status 11/29/2013 FINAL   Final  CULTURE, BLOOD (ROUTINE X 2)     Status: None   Collection Time    11/23/13  3:45 AM      Result Value Ref Range Status   Specimen Description BLOOD LEFT ARM   Final   Special Requests BOTTLES DRAWN AEROBIC AND ANAEROBIC [place] 3CC RED   Final   Culture  Setup Time     Final   Value: 11/23/2013 08:21     Performed at [facility]   Culture     Final   Value: NO GROWTH 5 DAYS     Performed at [facility]   Report Status 11/29/2013 FINAL   Final  MRSA PCR SCREENING     Status: Abnormal   Collection Time    11/23/13  5:49 AM      Result Value Ref Range Status   MRSA by PCR POSITIVE (*) NEGATIVE Final   Comment:            The GeneXpert MRSA Assay (FDA     approved for NASAL specimens      only), is one component of a     comprehensive MRSA colonization     surveillance program. It is not     intended to diagnose MRSA     infection nor to guide or     monitor treatment for     MRSA infections.     RESULT CALLED TO, READ BACK BY AND VERIFIED WITH:     CHRIS HAYES,RN [id number] 11/23/13 BY KBARR  CULTURE, BLOOD (ROUTINE X 2)     Status: None   Collection Time    11/23/13  7:00 AM      Result Value Ref Range Status   Specimen Description BLOOD LEFT HAND   Final   Special Requests     Final   Value: BOTTLES DRAWN AEROBIC AND ANAEROBIC 10CC BLUE, 8CC RED   Culture  Setup Time     Final   Value: 11/23/2013 12:20     Performed at [facility]   Culture     Final   Value: NO GROWTH 5 DAYS     Performed at [facility]   Report Status 11/29/2013 FINAL   Final  CULTURE, BLOOD (ROUTINE X 2)     Status: None   Collection Time    11/23/13  7:22 AM      Result Value Ref Range Status   Specimen Description BLOOD RIGHT HAND   Final   Special Requests     Final   Value: BOTTLES DRAWN AEROBIC AND ANAEROBIC 10CC BLUE, 6 CC RED   Culture  Setup Time     Final   Value: 11/23/2013 12:20     Performed at [facility]   Culture     Final   Value: NO GROWTH 5 DAYS     Performed at [facility]   Report Status 11/29/2013 FINAL   Final     Labs: Basic Metabolic Panel:  Recent Labs Lab 11/27/13 2209 11/28/13 0530 11/29/13 0500 11/29/13 0727 11/30/13 0540 12/01/13 1235  NA 131* 134* 129*  --  130* 131*  K 4.9 5.4* 6.4*  --  5.1 4.2  CL 90* 94* 89*  --  89* 89*  CO2 26 28 26   --  28 25  GLUCOSE 268* 119* 344*  --  470* 398*  BUN 23 26* 38*  --  26* 11  CREATININE 5.00* 5.68* 7.91*  --  5.55* 4.07*  CALCIUM 9.3 9.1 9.5  --  9.1 9.8  PHOS  --   --   --  3.9  --   --    Liver Function Tests:  Recent Labs Lab 11/27/13 0500  AST 18  ALT 14  ALKPHOS 152*  BILITOT 0.3  PROT 6.8  ALBUMIN 2.7*    Recent Labs Lab 11/27/13 0500  LIPASE  76*   No results found for this basename: AMMONIA,  in the last 168 hours CBC:  Recent Labs Lab 11/26/13 0205 11/29/13 0727 11/30/13 0540  WBC 6.9 4.5 5.3  NEUTROABS 4.8  --   --   HGB 8.7* 8.2* 8.3*  HCT 26.6* 26.3* 27.2*  MCV 96.7 100.8* 102.6*  PLT 128* 173 205   Cardiac Enzymes: No results found for this basename: CKTOTAL, CKMB, CKMBINDEX, TROPONINI,  in the last 168 hours BNP: BNP (last 3 results)  Recent Labs  05/03/13 0653 06/27/13 1510  PROBNP 33341.0* 5305.0*   CBG:  Recent Labs Lab 11/30/13 2100 11/30/13 2247 12/01/13 0749 12/01/13 1148 12/01/13 1513  GLUCAP 273* 390* 413* 426* 413*    Time coordinating discharge: 45 minutes  Signed:  Mabeline Varas  [facility] 12/01/2013, 4:34 PM

## 2013-12-01 NOTE — Progress Notes (Signed)
CSW spoke to patient via telephone around 5:30 pm.  She stated that her plans have changed and she and her daughter will go stay with her aunt here in [place].  She did not consider this an option prior because she thought that there were several members of the household who smoked and due to her 19 she could not be around smokers.  She stated that her daughter confirmed that all smokers would smoke outside of the home and thus patient/daughter were welcome there. [facility] coordinated with patient to arrange for delivery of portable 02 tank to the hospital and delivery of 02 concentrator to her aunts house.RNCM - [facility] is following patient.  Patient d/c home today. CSW signing off. Lorie Phenix. Pauline Good, [place]

## 2013-12-01 NOTE — Progress Notes (Signed)
Spoke with MD Short regarding lunch CBG. Pt CBG remains in 400s. Gave lunch insulin early per MD OK d/t high CBG this am. Gave Lantus as scheduled. Plan to monitor sugars at this time, will reassess as pt is having frequent episodes of hyper AND hypoglycemia. Will continue to monitor and recheck at 2pm.

## 2013-12-16 ENCOUNTER — Other Ambulatory Visit: Payer: Self-pay | Admitting: Obstetrics

## 2013-12-16 DIAGNOSIS — N6459 Other signs and symptoms in breast: Secondary | ICD-10-CM

## 2013-12-29 IMAGING — CR DG CHEST 1V PORT
1 series · 1 of 1 positions shown · non-contrast
Comparison: 08/01/2011

CLINICAL DATA: Status post dietary catheter placement.

PORTABLE CHEST - 1 VIEW

[AP]
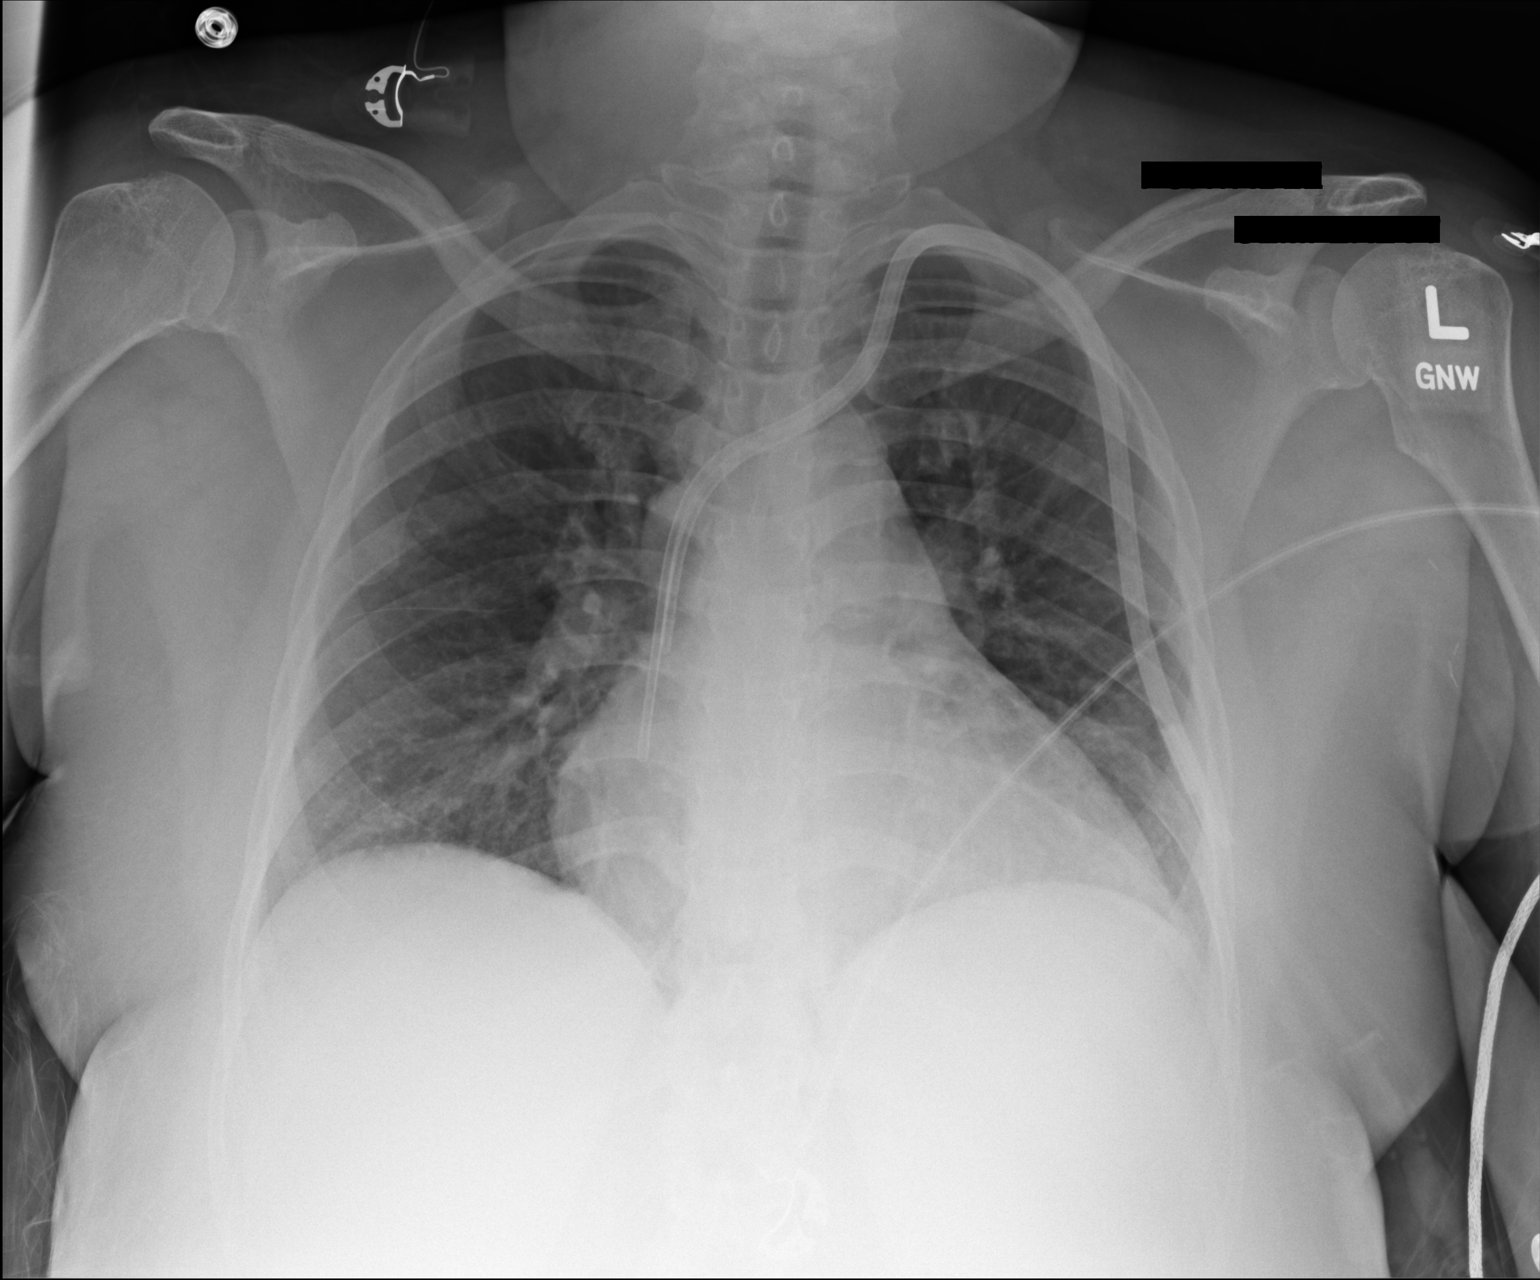

[1 of 1 positions shown; findings below may reference images not displayed]

FINDINGS: Left-sided dialysis catheter terminates at the mid and
low SVC, without pneumothorax.

Surgical clips in the left axilla/upper arm.

Borderline cardiomegaly, accentuated by AP portable technique.  No
pleural fluid. Low lung volumes with resultant pulmonary
interstitial prominence.  Clear lungs.
IMPRESSION: Appropriate position of dialysis catheter, without pneumothorax or
acute disease.

## 2014-01-07 ENCOUNTER — Ambulatory Visit
Admission: RE | Admit: 2014-01-07 | Discharge: 2014-01-07 | Disposition: A | Discharge status: Home / Self Care | Payer: Medicare Other | Source: Ambulatory Visit | Attending: Obstetrics | Admitting: Obstetrics

## 2014-01-07 DIAGNOSIS — N6459 Other signs and symptoms in breast: Secondary | ICD-10-CM

## 2014-01-14 ENCOUNTER — Encounter (HOSPITAL_COMMUNITY): Payer: Self-pay | Admitting: Emergency Medicine

## 2014-01-14 ENCOUNTER — Inpatient Hospital Stay (HOSPITAL_COMMUNITY)
Admission: EM | Admit: 2014-01-14 | Discharge: 2014-01-22 | DRG: 637 | Disposition: A | Discharge status: Home / Self Care | Payer: Medicare Other | Attending: Internal Medicine | Admitting: Internal Medicine

## 2014-01-14 ENCOUNTER — Emergency Department (HOSPITAL_COMMUNITY): Payer: Medicare Other

## 2014-01-14 ENCOUNTER — Emergency Department (HOSPITAL_COMMUNITY)
Admission: EM | Admit: 2014-01-14 | Discharge: 2014-01-14 | Disposition: A | Discharge status: Home / Self Care | Payer: Medicare Other | Source: Home / Self Care | Attending: Emergency Medicine | Admitting: Emergency Medicine

## 2014-01-14 DIAGNOSIS — F3289 Other specified depressive episodes: Secondary | ICD-10-CM | POA: Insufficient documentation

## 2014-01-14 DIAGNOSIS — I959 Hypotension, unspecified: Secondary | ICD-10-CM | POA: Diagnosis not present

## 2014-01-14 DIAGNOSIS — N2581 Secondary hyperparathyroidism of renal origin: Secondary | ICD-10-CM | POA: Diagnosis present

## 2014-01-14 DIAGNOSIS — D631 Anemia in chronic kidney disease: Secondary | ICD-10-CM | POA: Diagnosis present

## 2014-01-14 DIAGNOSIS — N186 End stage renal disease: Secondary | ICD-10-CM

## 2014-01-14 DIAGNOSIS — Z940 Kidney transplant status: Secondary | ICD-10-CM

## 2014-01-14 DIAGNOSIS — Z91199 Patient's noncompliance with other medical treatment and regimen due to unspecified reason: Secondary | ICD-10-CM

## 2014-01-14 DIAGNOSIS — E872 Acidosis, unspecified: Secondary | ICD-10-CM

## 2014-01-14 DIAGNOSIS — Z880 Allergy status to penicillin: Secondary | ICD-10-CM | POA: Insufficient documentation

## 2014-01-14 DIAGNOSIS — E11319 Type 2 diabetes mellitus with unspecified diabetic retinopathy without macular edema: Secondary | ICD-10-CM

## 2014-01-14 DIAGNOSIS — K219 Gastro-esophageal reflux disease without esophagitis: Secondary | ICD-10-CM

## 2014-01-14 DIAGNOSIS — E1021 Type 1 diabetes mellitus with diabetic nephropathy: Secondary | ICD-10-CM

## 2014-01-14 DIAGNOSIS — Z9119 Patient's noncompliance with other medical treatment and regimen: Secondary | ICD-10-CM

## 2014-01-14 DIAGNOSIS — G2581 Restless legs syndrome: Secondary | ICD-10-CM

## 2014-01-14 DIAGNOSIS — I10 Essential (primary) hypertension: Secondary | ICD-10-CM

## 2014-01-14 DIAGNOSIS — Z8249 Family history of ischemic heart disease and other diseases of the circulatory system: Secondary | ICD-10-CM

## 2014-01-14 DIAGNOSIS — D649 Anemia, unspecified: Secondary | ICD-10-CM | POA: Insufficient documentation

## 2014-01-14 DIAGNOSIS — Z794 Long term (current) use of insulin: Secondary | ICD-10-CM | POA: Insufficient documentation

## 2014-01-14 DIAGNOSIS — J45909 Unspecified asthma, uncomplicated: Secondary | ICD-10-CM | POA: Diagnosis present

## 2014-01-14 DIAGNOSIS — E1010 Type 1 diabetes mellitus with ketoacidosis without coma: Principal | ICD-10-CM

## 2014-01-14 DIAGNOSIS — R0602 Shortness of breath: Secondary | ICD-10-CM | POA: Insufficient documentation

## 2014-01-14 DIAGNOSIS — Z91148 Patient's other noncompliance with medication regimen for other reason: Secondary | ICD-10-CM

## 2014-01-14 DIAGNOSIS — Z8701 Personal history of pneumonia (recurrent): Secondary | ICD-10-CM

## 2014-01-14 DIAGNOSIS — Z7982 Long term (current) use of aspirin: Secondary | ICD-10-CM

## 2014-01-14 DIAGNOSIS — I120 Hypertensive chronic kidney disease with stage 5 chronic kidney disease or end stage renal disease: Secondary | ICD-10-CM | POA: Diagnosis present

## 2014-01-14 DIAGNOSIS — E1029 Type 1 diabetes mellitus with other diabetic kidney complication: Secondary | ICD-10-CM

## 2014-01-14 DIAGNOSIS — E669 Obesity, unspecified: Secondary | ICD-10-CM

## 2014-01-14 DIAGNOSIS — E1039 Type 1 diabetes mellitus with other diabetic ophthalmic complication: Secondary | ICD-10-CM | POA: Diagnosis present

## 2014-01-14 DIAGNOSIS — I4729 Other ventricular tachycardia: Secondary | ICD-10-CM | POA: Diagnosis present

## 2014-01-14 DIAGNOSIS — G43909 Migraine, unspecified, not intractable, without status migrainosus: Secondary | ICD-10-CM

## 2014-01-14 DIAGNOSIS — Z992 Dependence on renal dialysis: Secondary | ICD-10-CM | POA: Insufficient documentation

## 2014-01-14 DIAGNOSIS — E1143 Type 2 diabetes mellitus with diabetic autonomic (poly)neuropathy: Secondary | ICD-10-CM

## 2014-01-14 DIAGNOSIS — E875 Hyperkalemia: Secondary | ICD-10-CM | POA: Diagnosis present

## 2014-01-14 DIAGNOSIS — E1110 Type 2 diabetes mellitus with ketoacidosis without coma: Secondary | ICD-10-CM | POA: Diagnosis present

## 2014-01-14 DIAGNOSIS — Z9483 Pancreas transplant status: Secondary | ICD-10-CM

## 2014-01-14 DIAGNOSIS — I209 Angina pectoris, unspecified: Secondary | ICD-10-CM

## 2014-01-14 DIAGNOSIS — J438 Other emphysema: Secondary | ICD-10-CM

## 2014-01-14 DIAGNOSIS — J45901 Unspecified asthma with (acute) exacerbation: Secondary | ICD-10-CM | POA: Insufficient documentation

## 2014-01-14 DIAGNOSIS — Z872 Personal history of diseases of the skin and subcutaneous tissue: Secondary | ICD-10-CM | POA: Insufficient documentation

## 2014-01-14 DIAGNOSIS — Z87828 Personal history of other (healed) physical injury and trauma: Secondary | ICD-10-CM | POA: Insufficient documentation

## 2014-01-14 DIAGNOSIS — N058 Unspecified nephritic syndrome with other morphologic changes: Secondary | ICD-10-CM | POA: Diagnosis present

## 2014-01-14 DIAGNOSIS — E1065 Type 1 diabetes mellitus with hyperglycemia: Secondary | ICD-10-CM

## 2014-01-14 DIAGNOSIS — I472 Ventricular tachycardia, unspecified: Secondary | ICD-10-CM | POA: Diagnosis not present

## 2014-01-14 DIAGNOSIS — Z9981 Dependence on supplemental oxygen: Secondary | ICD-10-CM | POA: Diagnosis not present

## 2014-01-14 DIAGNOSIS — R112 Nausea with vomiting, unspecified: Secondary | ICD-10-CM

## 2014-01-14 DIAGNOSIS — J40 Bronchitis, not specified as acute or chronic: Secondary | ICD-10-CM

## 2014-01-14 DIAGNOSIS — K3184 Gastroparesis: Secondary | ICD-10-CM | POA: Diagnosis present

## 2014-01-14 DIAGNOSIS — F329 Major depressive disorder, single episode, unspecified: Secondary | ICD-10-CM | POA: Insufficient documentation

## 2014-01-14 DIAGNOSIS — N189 Chronic kidney disease, unspecified: Secondary | ICD-10-CM

## 2014-01-14 DIAGNOSIS — E1310 Other specified diabetes mellitus with ketoacidosis without coma: Secondary | ICD-10-CM

## 2014-01-14 DIAGNOSIS — Z9114 Patient's other noncompliance with medication regimen: Secondary | ICD-10-CM

## 2014-01-14 DIAGNOSIS — E0810 Diabetes mellitus due to underlying condition with ketoacidosis without coma: Secondary | ICD-10-CM

## 2014-01-14 DIAGNOSIS — Z79899 Other long term (current) drug therapy: Secondary | ICD-10-CM

## 2014-01-14 DIAGNOSIS — N039 Chronic nephritic syndrome with unspecified morphologic changes: Secondary | ICD-10-CM

## 2014-01-14 DIAGNOSIS — N185 Chronic kidney disease, stage 5: Secondary | ICD-10-CM

## 2014-01-14 LAB — GLUCOSE, CAPILLARY
GLUCOSE-CAPILLARY: 131 mg/dL — AB (ref 70–99)
GLUCOSE-CAPILLARY: 452 mg/dL — AB (ref 70–99)
Glucose-Capillary: 275 mg/dL — ABNORMAL HIGH (ref 70–99)
Glucose-Capillary: 162 mg/dL — ABNORMAL HIGH (ref 70–99)
Glucose-Capillary: 110 mg/dL — ABNORMAL HIGH (ref 70–99)

## 2014-01-14 LAB — COMPREHENSIVE METABOLIC PANEL
ALT: 8 U/L (ref 0–35)
AST: 13 U/L (ref 0–37)
Albumin: 3.7 g/dL (ref 3.5–5.2)
Alkaline Phosphatase: 167 U/L — ABNORMAL HIGH (ref 39–117)
Anion gap: 28 — ABNORMAL HIGH (ref 5–15)
BUN: 77 mg/dL — ABNORMAL HIGH (ref 6–23)
CHLORIDE: 81 meq/L — AB (ref 96–112)
CO2: 11 meq/L — AB (ref 19–32)
Calcium: 8.4 mg/dL (ref 8.4–10.5)
Creatinine, Ser: 12.78 mg/dL — ABNORMAL HIGH (ref 0.50–1.10)
GFR calc Af Amer: 4 mL/min — ABNORMAL LOW (ref >90)
GFR, EST NON AFRICAN AMERICAN: 3 mL/min — AB (ref >90)
Glucose, Bld: 829 mg/dL (ref 70–99)
Potassium: >7.7 mEq/L (ref 3.7–5.3)
Sodium: 120 mEq/L — CL (ref 137–147)
Total Bilirubin: 0.4 mg/dL (ref 0.3–1.2)
Total Protein: 8.4 g/dL — ABNORMAL HIGH (ref 6.0–8.3)

## 2014-01-14 LAB — BASIC METABOLIC PANEL
Anion gap: 20 — ABNORMAL HIGH (ref 5–15)
BUN: 65 mg/dL — ABNORMAL HIGH (ref 6–23)
CHLORIDE: 92 meq/L — AB (ref 96–112)
CO2: 21 mEq/L (ref 19–32)
Calcium: 8.5 mg/dL (ref 8.4–10.5)
Creatinine, Ser: 12.17 mg/dL — ABNORMAL HIGH (ref 0.50–1.10)
GFR calc Af Amer: 4 mL/min — ABNORMAL LOW (ref >90)
GFR calc non Af Amer: 3 mL/min — ABNORMAL LOW (ref >90)
GLUCOSE: 110 mg/dL — AB (ref 70–99)
POTASSIUM: 5.3 meq/L (ref 3.7–5.3)
SODIUM: 133 meq/L — AB (ref 137–147)

## 2014-01-14 LAB — POCT I-STAT, CHEM 8
BUN: 36 mg/dL — ABNORMAL HIGH (ref 6–23)
CHLORIDE: 96 meq/L (ref 96–112)
CREATININE: 7.6 mg/dL — AB (ref 0.50–1.10)
Calcium, Ion: 1.1 mmol/L — ABNORMAL LOW (ref 1.12–1.23)
Glucose, Bld: 375 mg/dL — ABNORMAL HIGH (ref 70–99)
HEMATOCRIT: 38 % (ref 36.0–46.0)
Hemoglobin: 12.9 g/dL (ref 12.0–15.0)
POTASSIUM: 3.8 meq/L (ref 3.7–5.3)
Sodium: 133 mEq/L — ABNORMAL LOW (ref 137–147)
TCO2: 23 mmol/L (ref 0–100)

## 2014-01-14 LAB — I-STAT CHEM 8, ED
BUN: 58 mg/dL — AB (ref 6–23)
CHLORIDE: 101 meq/L (ref 96–112)
Calcium, Ion: 0.96 mmol/L — ABNORMAL LOW (ref 1.12–1.23)
Creatinine, Ser: 13.3 mg/dL — ABNORMAL HIGH (ref 0.50–1.10)
Glucose, Bld: 112 mg/dL — ABNORMAL HIGH (ref 70–99)
HEMATOCRIT: 39 % (ref 36.0–46.0)
Hemoglobin: 13.3 g/dL (ref 12.0–15.0)
POTASSIUM: 4.9 meq/L (ref 3.7–5.3)
SODIUM: 130 meq/L — AB (ref 137–147)
TCO2: 24 mmol/L (ref 0–100)

## 2014-01-14 LAB — RENAL FUNCTION PANEL
ANION GAP: 19 — AB (ref 5–15)
Albumin: 3.7 g/dL (ref 3.5–5.2)
BUN: 27 mg/dL — AB (ref 6–23)
CO2: 23 meq/L (ref 19–32)
Calcium: 9.1 mg/dL (ref 8.4–10.5)
Chloride: 92 mEq/L — ABNORMAL LOW (ref 96–112)
Creatinine, Ser: 5.17 mg/dL — ABNORMAL HIGH (ref 0.50–1.10)
GFR calc Af Amer: 11 mL/min — ABNORMAL LOW (ref >90)
GFR calc non Af Amer: 9 mL/min — ABNORMAL LOW (ref >90)
GLUCOSE: 285 mg/dL — AB (ref 70–99)
PHOSPHORUS: 3.3 mg/dL (ref 2.3–4.6)
POTASSIUM: 3.7 meq/L (ref 3.7–5.3)
Sodium: 134 mEq/L — ABNORMAL LOW (ref 137–147)

## 2014-01-14 LAB — CBC
HCT: 34.6 % — ABNORMAL LOW (ref 36.0–46.0)
HEMOGLOBIN: 11.6 g/dL — AB (ref 12.0–15.0)
MCH: 32.9 pg (ref 26.0–34.0)
MCHC: 33.5 g/dL (ref 30.0–36.0)
MCV: 98 fL (ref 78.0–100.0)
Platelets: 156 10*3/uL (ref 150–400)
RBC: 3.53 MIL/uL — ABNORMAL LOW (ref 3.87–5.11)
RDW: 15.6 % — ABNORMAL HIGH (ref 11.5–15.5)
WBC: 5.6 10*3/uL (ref 4.0–10.5)

## 2014-01-14 LAB — CBC WITH DIFFERENTIAL/PLATELET
BASOS ABS: 0 10*3/uL (ref 0.0–0.1)
Basophils Relative: 0 % (ref 0–1)
EOS PCT: 0 % (ref 0–5)
Eosinophils Absolute: 0 10*3/uL (ref 0.0–0.7)
HCT: 41.6 % (ref 36.0–46.0)
Hemoglobin: 13.4 g/dL (ref 12.0–15.0)
LYMPHS PCT: 8 % — AB (ref 12–46)
Lymphs Abs: 0.5 10*3/uL — ABNORMAL LOW (ref 0.7–4.0)
MCH: 32.8 pg (ref 26.0–34.0)
MCHC: 32.2 g/dL (ref 30.0–36.0)
MCV: 102 fL — ABNORMAL HIGH (ref 78.0–100.0)
Monocytes Absolute: 0.1 10*3/uL (ref 0.1–1.0)
Monocytes Relative: 1 % — ABNORMAL LOW (ref 3–12)
NEUTROS ABS: 5.4 10*3/uL (ref 1.7–7.7)
Neutrophils Relative %: 91 % — ABNORMAL HIGH (ref 43–77)
Platelets: 169 10*3/uL (ref 150–400)
RBC: 4.08 MIL/uL (ref 3.87–5.11)
RDW: 15.7 % — AB (ref 11.5–15.5)
WBC: 6 10*3/uL (ref 4.0–10.5)

## 2014-01-14 LAB — CBG MONITORING, ED

## 2014-01-14 LAB — PRO B NATRIURETIC PEPTIDE: PRO B NATRI PEPTIDE: 17541 pg/mL — AB (ref 0–125)

## 2014-01-14 MED ORDER — ACETAMINOPHEN 325 MG PO TABS
325.0000 mg | ORAL_TABLET | Freq: Once | ORAL | Status: AC
  Administered 2014-01-14: 325 mg via ORAL

## 2014-01-14 MED ORDER — METOPROLOL SUCCINATE ER 100 MG PO TB24
100.0000 mg | ORAL_TABLET | Freq: Every day | ORAL | Status: DC
  Administered 2014-01-15 – 2014-01-18 (×4): 100 mg via ORAL
  Filled 2014-01-14 (×6): qty 1

## 2014-01-14 MED ORDER — SODIUM CHLORIDE 0.9 % IV BOLUS (SEPSIS)
500.0000 mL | Freq: Once | INTRAVENOUS | Status: AC
  Administered 2014-01-14: 500 mL via INTRAVENOUS

## 2014-01-14 MED ORDER — SODIUM CHLORIDE 0.9 % IV SOLN: 100.0000 mL | INTRAVENOUS | Status: DC | PRN

## 2014-01-14 MED ORDER — NEPRO/CARBSTEADY PO LIQD: 237.0000 mL | ORAL | Status: DC | PRN

## 2014-01-14 MED ORDER — DOXERCALCIFEROL 4 MCG/2ML IV SOLN
4.0000 ug | INTRAVENOUS | Status: DC
  Administered 2014-01-16 – 2014-01-21 (×3): 4 ug via INTRAVENOUS
  Filled 2014-01-14 (×3): qty 2

## 2014-01-14 MED ORDER — DEXTROSE-NACL 5-0.45 % IV SOLN: INTRAVENOUS | Status: DC

## 2014-01-14 MED ORDER — PROMETHAZINE HCL 25 MG/ML IJ SOLN
12.5000 mg | Freq: Once | INTRAMUSCULAR | Status: AC
  Administered 2014-01-14: 12.5 mg via INTRAVENOUS

## 2014-01-14 MED ORDER — CLONAZEPAM 0.5 MG PO TABS
1.0000 mg | ORAL_TABLET | Freq: Every day | ORAL | Status: DC
  Administered 2014-01-15 – 2014-01-21 (×8): 1 mg via ORAL
  Filled 2014-01-14 (×5): qty 2
  Filled 2014-01-14: qty 1
  Filled 2014-01-14 (×2): qty 2

## 2014-01-14 MED ORDER — PANTOPRAZOLE SODIUM 40 MG PO TBEC
40.0000 mg | DELAYED_RELEASE_TABLET | Freq: Every day | ORAL | Status: DC
  Administered 2014-01-15 – 2014-01-21 (×8): 40 mg via ORAL
  Filled 2014-01-14 (×10): qty 1

## 2014-01-14 MED ORDER — SODIUM CHLORIDE 0.9 % IV SOLN: INTRAVENOUS | Status: DC

## 2014-01-14 MED ORDER — DOXERCALCIFEROL 4 MCG/2ML IV SOLN
INTRAVENOUS | Status: AC
  Filled 2014-01-14: qty 2

## 2014-01-14 MED ORDER — ALBUTEROL SULFATE (2.5 MG/3ML) 0.083% IN NEBU
5.0000 mg | INHALATION_SOLUTION | Freq: Once | RESPIRATORY_TRACT | Status: AC
  Administered 2014-01-14: 5 mg via RESPIRATORY_TRACT
  Filled 2014-01-14: qty 6

## 2014-01-14 MED ORDER — PREDNISONE 20 MG PO TABS: 20.0000 mg | ORAL_TABLET | Freq: Two times a day (BID) | ORAL | Status: DC

## 2014-01-14 MED ORDER — PENTAFLUOROPROP-TETRAFLUOROETH EX AERO
1.0000 | INHALATION_SPRAY | CUTANEOUS | Status: DC | PRN
Start: 2014-01-14 — End: 2014-01-14

## 2014-01-14 MED ORDER — LIDOCAINE-PRILOCAINE 2.5-2.5 % EX CREA: 1.0000 "application " | TOPICAL_CREAM | CUTANEOUS | Status: DC

## 2014-01-14 MED ORDER — HEPARIN SODIUM (PORCINE) 1000 UNIT/ML DIALYSIS: 20.0000 [IU]/kg | INTRAMUSCULAR | Status: DC | PRN

## 2014-01-14 MED ORDER — ALBUTEROL SULFATE (2.5 MG/3ML) 0.083% IN NEBU: 2.5000 mg | INHALATION_SOLUTION | RESPIRATORY_TRACT | Status: DC | PRN

## 2014-01-14 MED ORDER — OXYCODONE HCL 5 MG PO TABS
5.0000 mg | ORAL_TABLET | ORAL | Status: DC | PRN
  Administered 2014-01-14 – 2014-01-17 (×4): 5 mg via ORAL
  Filled 2014-01-14 (×3): qty 1

## 2014-01-14 MED ORDER — LEVOFLOXACIN 750 MG PO TABS
750.0000 mg | ORAL_TABLET | Freq: Once | ORAL | Status: AC
  Administered 2014-01-14: 750 mg via ORAL
  Filled 2014-01-14: qty 1

## 2014-01-14 MED ORDER — SODIUM CHLORIDE 0.9 % IV SOLN: INTRAVENOUS | Status: AC

## 2014-01-14 MED ORDER — ACETAMINOPHEN 325 MG PO TABS
325.0000 mg | ORAL_TABLET | Freq: Once | ORAL | Status: AC
  Administered 2014-01-14: 325 mg via ORAL
  Filled 2014-01-14: qty 1

## 2014-01-14 MED ORDER — ENOXAPARIN SODIUM 30 MG/0.3ML ~~LOC~~ SOLN
30.0000 mg | Freq: Every day | SUBCUTANEOUS | Status: DC
  Filled 2014-01-14 (×8): qty 0.3

## 2014-01-14 MED ORDER — LEVOFLOXACIN IN D5W 500 MG/100ML IV SOLN: 500.0000 mg | INTRAVENOUS | Status: DC

## 2014-01-14 MED ORDER — SODIUM BICARBONATE 8.4 % IV SOLN: 50.0000 meq | Freq: Once | INTRAVENOUS | Status: DC

## 2014-01-14 MED ORDER — PREDNISONE 20 MG PO TABS
60.0000 mg | ORAL_TABLET | Freq: Once | ORAL | Status: AC
  Administered 2014-01-14: 60 mg via ORAL
  Filled 2014-01-14: qty 3

## 2014-01-14 MED ORDER — ONDANSETRON HCL 4 MG/2ML IJ SOLN
4.0000 mg | Freq: Once | INTRAMUSCULAR | Status: AC
  Administered 2014-01-14: 4 mg via INTRAVENOUS
  Filled 2014-01-14: qty 2

## 2014-01-14 MED ORDER — LIDOCAINE-PRILOCAINE 2.5-2.5 % EX CREA: 1.0000 "application " | TOPICAL_CREAM | CUTANEOUS | Status: DC | PRN

## 2014-01-14 MED ORDER — LIDOCAINE HCL (PF) 1 % IJ SOLN
5.0000 mL | INTRAMUSCULAR | Status: DC | PRN
Start: 2014-01-14 — End: 2014-01-14

## 2014-01-14 MED ORDER — PREDNISONE 20 MG PO TABS
20.0000 mg | ORAL_TABLET | Freq: Two times a day (BID) | ORAL | Status: DC
  Administered 2014-01-15 (×2): 20 mg via ORAL
  Filled 2014-01-14 (×5): qty 1

## 2014-01-14 MED ORDER — PROMETHAZINE HCL 25 MG/ML IJ SOLN
INTRAMUSCULAR | Status: AC
  Filled 2014-01-14: qty 1

## 2014-01-14 MED ORDER — CALCIUM ACETATE 667 MG PO CAPS
2001.0000 mg | ORAL_CAPSULE | Freq: Three times a day (TID) | ORAL | Status: DC
  Administered 2014-01-15 – 2014-01-22 (×20): 2001 mg via ORAL
  Filled 2014-01-14 (×25): qty 3

## 2014-01-14 MED ORDER — SODIUM CHLORIDE 0.9 % IV SOLN
1.0000 g | Freq: Once | INTRAVENOUS | Status: DC
  Filled 2014-01-14: qty 10

## 2014-01-14 MED ORDER — ALTEPLASE 2 MG IJ SOLR: 2.0000 mg | Freq: Once | INTRAMUSCULAR | Status: DC | PRN

## 2014-01-14 MED ORDER — ONDANSETRON 4 MG PO TBDP
4.0000 mg | ORAL_TABLET | Freq: Four times a day (QID) | ORAL | Status: DC | PRN
  Administered 2014-01-15 – 2014-01-16 (×2): 4 mg via ORAL
  Filled 2014-01-14 (×7): qty 1

## 2014-01-14 MED ORDER — SODIUM CHLORIDE 0.9 % IV SOLN: 250.0000 mL | INTRAVENOUS | Status: DC | PRN

## 2014-01-14 MED ORDER — METOCLOPRAMIDE HCL 5 MG PO TABS
5.0000 mg | ORAL_TABLET | Freq: Three times a day (TID) | ORAL | Status: DC
  Administered 2014-01-15 – 2014-01-22 (×29): 5 mg via ORAL
  Filled 2014-01-14 (×36): qty 1

## 2014-01-14 MED ORDER — DOXERCALCIFEROL 4 MCG/2ML IV SOLN
4.0000 ug | INTRAVENOUS | Status: DC
  Administered 2014-01-14: 4 ug via INTRAVENOUS
  Filled 2014-01-14: qty 2

## 2014-01-14 MED ORDER — ASPIRIN EC 81 MG PO TBEC
81.0000 mg | DELAYED_RELEASE_TABLET | Freq: Every day | ORAL | Status: DC
  Administered 2014-01-15 – 2014-01-22 (×8): 81 mg via ORAL
  Filled 2014-01-14 (×8): qty 1

## 2014-01-14 MED ORDER — SODIUM CHLORIDE 0.9 % IV SOLN
100.0000 mL | INTRAVENOUS | Status: DC | PRN
Start: 2014-01-14 — End: 2014-01-14

## 2014-01-14 MED ORDER — IPRATROPIUM BROMIDE 0.02 % IN SOLN
0.5000 mg | Freq: Once | RESPIRATORY_TRACT | Status: AC
  Administered 2014-01-14: 0.5 mg via RESPIRATORY_TRACT
  Filled 2014-01-14: qty 2.5

## 2014-01-14 MED ORDER — PENTAFLUOROPROP-TETRAFLUOROETH EX AERO
INHALATION_SPRAY | CUTANEOUS | Status: AC
  Filled 2014-01-14: qty 103.5

## 2014-01-14 MED ORDER — INSULIN ASPART 100 UNIT/ML ~~LOC~~ SOLN
6.0000 [IU] | Freq: Once | SUBCUTANEOUS | Status: AC
  Administered 2014-01-14: 6 [IU] via INTRAVENOUS
  Filled 2014-01-14: qty 1

## 2014-01-14 MED ORDER — GABAPENTIN 100 MG PO CAPS
100.0000 mg | ORAL_CAPSULE | Freq: Every day | ORAL | Status: DC
  Administered 2014-01-15 – 2014-01-22 (×8): 100 mg via ORAL
  Filled 2014-01-14 (×8): qty 1

## 2014-01-14 MED ORDER — OXYCODONE HCL 5 MG PO TABS
ORAL_TABLET | ORAL | Status: AC
  Filled 2014-01-14: qty 1

## 2014-01-14 MED ORDER — INSULIN ASPART 100 UNIT/ML ~~LOC~~ SOLN
6.0000 [IU] | Freq: Once | SUBCUTANEOUS | Status: AC
  Administered 2014-01-14: 6 [IU] via SUBCUTANEOUS
  Filled 2014-01-14: qty 0.06

## 2014-01-14 MED ORDER — LEVOFLOXACIN IN D5W 750 MG/150ML IV SOLN: 750.0000 mg | Freq: Once | INTRAVENOUS | Status: DC

## 2014-01-14 MED ORDER — RENA-VITE PO TABS
1.0000 | ORAL_TABLET | Freq: Every day | ORAL | Status: DC
  Administered 2014-01-15 – 2014-01-21 (×8): 1 via ORAL
  Filled 2014-01-14 (×10): qty 1

## 2014-01-14 MED ORDER — HEPARIN SODIUM (PORCINE) 1000 UNIT/ML DIALYSIS: 1000.0000 [IU] | INTRAMUSCULAR | Status: DC | PRN

## 2014-01-14 MED ORDER — LEVOFLOXACIN 500 MG PO TABS: 500.0000 mg | ORAL_TABLET | Freq: Every day | ORAL | Status: DC

## 2014-01-14 MED ORDER — DARBEPOETIN ALFA-POLYSORBATE 100 MCG/0.5ML IJ SOLN
100.0000 ug | INTRAMUSCULAR | Status: DC
  Administered 2014-01-19: 100 ug via INTRAVENOUS
  Filled 2014-01-14: qty 0.5

## 2014-01-14 MED ORDER — SODIUM CHLORIDE 0.9 % IV SOLN
INTRAVENOUS | Status: DC
  Filled 2014-01-14: qty 2.5

## 2014-01-14 MED ORDER — CALCIUM CHLORIDE 10 % IV SOLN: 0.5000 g | Freq: Once | INTRAVENOUS | Status: DC

## 2014-01-14 MED ORDER — DEXTROSE 50 % IV SOLN: 25.0000 mL | INTRAVENOUS | Status: DC | PRN

## 2014-01-14 NOTE — ED Provider Notes (Addendum)
CSN: [id number]     Arrival date & time 01/14/14  0010 History   First MD Initiated Contact with Patient 01/14/14 0255     Chief Complaint  Patient presents with  . Shortness of Breath     (Consider location/radiation/quality/duration/timing/severity/associated sxs/prior Treatment) Patient is a 43 y.o. female presenting with shortness of breath. The history is provided by the patient.  Shortness of Breath  She c/o SOB for 1 week. She had last Dialysis 2 days ago. She is taking her usual medications. She denies Chest Pain, weakness or dizziness. There are no other known modifying factors.   Past Medical History  Diagnosis Date  . Hyperlipidemia   . Alopecia   . Obesity   . Iron deficiency   . ESRD (end stage renal disease)     S/p pancreatic and kidney transplant, but back on HD 2008, dialysis at [facility], [place], Sat  . RLS (restless legs syndrome)   . Injury of conjunctiva and corneal abrasion of right eye without foreign body   . Cellulitis   . Anemia   . Blood transfusion 2004    "when I had my transplant"  . Migraine   . Hyperparathyroidism, secondary   . Diabetic retinopathy   . Hypertension     takes Metoprolol and Exforge daily  . Depression     takes Klonopin nightly  . Diabetes mellitus type 1     Pt states diagnosed at age 27 with prior episodes of DKA. S/p pancreatic transplant   . GERD (gastroesophageal reflux disease)     takes Protonix daily  . Bronchitis   . Migraine   . Asthma   . Emphysema of lung   . Angina     none in past 2 years.  . Stomach pain     Hx: of chronic  . Pneumonia 2012    "double"  . Dialysis patient     tues,thurs,sat  . 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  Surgery was done at [facility].      Past Surgical  History  Procedure Laterality Date  . 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 Thyroglobulin      x 7  . Av fistula placement      right upper arm  . Cholecystectomy  1995  .  hd graft placement/removal      "had 2 in my left upper arm"  . Eye surgery    . Retinopathy surgery    . Tooth extraction  10/10/11    X's two  . Insertion of dialysis catheter  12/08/2011    Procedure: INSERTION OF DIALYSIS CATHETER;  Surgeon: Angelia Mould, MD;  Location: [place];  Service: Vascular;  Laterality: Left;  . Av fistula placement  01/22/2012    Procedure: INSERTION OF ARTERIOVENOUS (AV) GORE-TEX GRAFT ARM;  Surgeon: Angelia Mould, MD;  Location: [place];  Service: Vascular;  Laterality: Left;  Ultrasound guided.  . Av fistula placement  03/14/2012    Procedure: INSERTION OF ARTERIOVENOUS (AV) GORE-TEX GRAFT THIGH;  Surgeon: Angelia Mould, MD;  Location: [place];  Service: Vascular;  Laterality: Right;  . False aneurysm repair Right 01/02/2013    Procedure: Excision of lymphocele in right thigh.;  Surgeon: Harrell Gave  Nicole Cella, MD;  Location: [place];  Service: Vascular;  Laterality: Right;  . Carpal tunnel release Right 03/02/2013    Procedure: CARPAL TUNNEL RELEASE;  Surgeon: Tennis Must, MD;  Location: [place];  Service: Orthopedics;  Laterality: Right;  . Flexible sigmoidoscopy N/A 03/24/2013    Procedure: FLEXIBLE SIGMOIDOSCOPY;  Surgeon: Cleotis Nipper, MD;  Location: [facility] ENDOSCOPY;  Service: Endoscopy;  Laterality: N/A;  . Revision of arteriovenous goretex graft Right 04/02/2013    Procedure: REPAIR OF PSEUDOANEURYSM OF ARTERIOVENOUS GORETEX GRAFT;  Surgeon: Angelia Mould, MD;  Location: [place];  Service: Vascular;  Laterality: Right;  . Carpal tunnel release Left 10/16/2013    Procedure: LEFT CARPAL TUNNEL RELEASE;  Surgeon: Tennis Must, MD;  Location: [place];  Service: Orthopedics;  Laterality: Left;    Family History  Problem Relation Age of Onset  . Hypertension Mother   . Kidney disease Mother   . Diabetes Mother   . Hypertension Father   . Kidney disease Father   . Diabetes Father    History  Substance Use Topics  . Smoking status: Never Smoker   . Smokeless tobacco: Never Used  . Alcohol Use: No   OB History   Grav Para Term Preterm Abortions TAB SAB Ect Mult Living                 Review of Systems  Respiratory: Positive for shortness of breath.   All other systems reviewed and are negative.     Allergies  Depakote; Morphine and related; Penicillins; Tramadol; Imitrex; and Vicodin  Home Medications   Prior to Admission medications   Medication Sig Start Date End Date Taking? Authorizing Provider  albuterol (PROVENTIL HFA;VENTOLIN HFA) 108 (90 BASE) MCG/ACT inhaler Inhale 2 puffs into the lungs every 4 (four) hours as needed for wheezing or shortness of breath (as per home regimen). 02/20/13   Kinnie Feil, MD  aspirin EC 81 MG tablet Take 81 mg by mouth daily.     Historical Provider, MD  calcium acetate (PHOSLO) 667 MG capsule Take 2,001 mg by mouth 3 (three) times daily with meals.    Historical Provider, MD  clonazePAM (KLONOPIN) 0.5 MG tablet Take 1 mg by mouth at bedtime.     Historical Provider, MD  EXFORGE 10-160 MG per tablet Take 1 tablet by mouth at bedtime. Take on tues,thur, Saturday and sunday 06/15/12   Historical Provider, MD  FLUoxetine (PROZAC) 20 MG capsule Take 20 mg by mouth at bedtime.    Historical Provider, MD  gabapentin (NEURONTIN) 100 MG capsule Take 100 mg by mouth daily.    Historical Provider, MD  insulin aspart (NOVOLOG FLEXPEN) 100 UNIT/ML FlexPen Inject 6 Units into the skin 2 (two) times daily with a meal.    Historical Provider, MD  Insulin Glargine (LANTUS) 100 UNIT/ML Solostar Pen Inject 7 Units into the skin 2 (two) times daily with a meal.    Historical Provider, MD  lidocaine-prilocaine (EMLA) cream Apply 1 application  topically 2 (two) times a week.  10/24/12   Historical Provider, MD  metoCLOPramide (REGLAN) 5 MG tablet Take 1 tablet (5 mg total) by mouth 4 (four) times daily -  before meals and at bedtime. 12/01/13   Janece Canterbury, MD  metoprolol succinate (TOPROL-XL) 100 MG 24 hr tablet Take 100 mg by mouth at bedtime. Take with or immediately following a meal.    Historical Provider, MD  montelukast (SINGULAIR) 10 MG  tablet Take 10 mg by mouth at bedtime.    Historical Provider, MD  multivitamin (RENA-VIT) TABS tablet Take 1 tablet by mouth daily.    Historical Provider, MD  ondansetron (ZOFRAN-ODT) 4 MG disintegrating tablet Take 1 tablet (4 mg total) by mouth every 6 (six) hours as needed for nausea or vomiting. 12/01/13   Janece Canterbury, MD  pantoprazole (PROTONIX) 40 MG tablet Take 1 tablet (40 mg total) by mouth at bedtime. 03/27/13   Hosie Poisson, MD  zolpidem (AMBIEN CR) 12.5 MG CR tablet Take 12.5 mg by mouth at bedtime.    Historical Provider, MD   BP 141/71  Pulse 80  Temp(Src) 98.4 F (36.9 C) (Oral)  Resp 17  Ht 5\' 1"  (1.549 m)  Wt 150 lb (68.04 kg)  BMI 28.36 kg/m2  SpO2 94%  LMP 12/26/2013 Physical Exam  Nursing note and vitals reviewed. Constitutional: She is oriented to person, place, and time. She appears well-developed. No distress.  Appears older than stated age.  HENT:  Head: Normocephalic and atraumatic.  Eyes: Conjunctivae and EOM are normal. Pupils are equal, round, and reactive to light.  Neck: Normal range of motion and phonation normal. Neck supple.  Cardiovascular: Normal rate, regular rhythm and intact distal pulses.   Pulmonary/Chest: Effort normal. No respiratory distress. She exhibits no tenderness.  Decrease air movement bilaterally with scattered rhonchi.  Abdominal: Soft. She exhibits no distension. There is no tenderness. There is no guarding.  Musculoskeletal: Normal range of motion.  Neurological: She is alert and oriented to person, place, and time. She  exhibits normal muscle tone.  Skin: Skin is warm and dry.  Psychiatric: She has a normal mood and affect. Her behavior is normal. Judgment and thought content normal.    ED Course  Procedures (including critical care time) Medications  albuterol (PROVENTIL) (2.5 MG/3ML) 0.083% nebulizer solution 5 mg (not administered)  ipratropium (ATROVENT) nebulizer solution 0.5 mg (not administered)    Patient Vitals for the past 24 hrs:  BP Temp Temp src Pulse Resp SpO2 Height Weight  01/14/14 0110 141/71 mmHg - - 80 17 94 % - -  01/14/14 0019 120/57 mmHg 98.4 F (36.9 C) Oral 82 20 98 % 5\' 1"  (1.549 m) 150 lb (68.04 kg)   01/14/14 0445 -- 79 17 134/82 mmHg 100 % -- -- CC 01/14/14 0400 -- 82 14 165/76 mmHg 100 % -- -- CC 01/14/14 0330 -- 78 15 136/72 mmHg 98 % -- -- CC 01/14/14 0319 -- 77 18 152/74 mmHg 100 % -- -- CC 01/14/14 0315 -- 77 21 152/74 mmHg 100 % -- -- CC 01/14/14 0300  02:59- Consultation Nephrology- [facility]. Plan Rx Levaquin and Dialyze in AM  5:02 AM Reevaluation with update and discussion. After initial assessment and treatment, an updated evaluation reveals the patient is calm, comfortable, expresses no further needs. Findings discussed with the patient, all questions answered. Crimson Dubberly L    Labs Review Labs Reviewed  CBC - Abnormal; Notable for the following:    RBC 3.53 (*)    Hemoglobin 11.6 (*)    HCT 34.6 (*)    RDW 15.6 (*)    All other components within normal limits  I-STAT CHEM 8, ED - Abnormal; Notable for the following:    Sodium 130 (*)    BUN 58 (*)    Creatinine, Ser 13.30 (*)    Glucose, Bld 112 (*)    Calcium, Ion 0.96 (*)    All other components within normal limits  BASIC METABOLIC PANEL  PRO B NATRIURETIC PEPTIDE    Imaging Review Dg Chest 2 View  01/14/2014   CLINICAL DATA:  Chest pain and shortness of Breath.  EXAM: CHEST  2 VIEW  COMPARISON:  11/27/2013.  FINDINGS: The heart is mildly enlarged but stable. The mediastinal and hilar  contours are unchanged. Persistent bronchitic changes but slight overall improved aeration. No pleural effusion. The bony thorax is intact.  IMPRESSION: Stable cardiac enlargement and chronic bronchitic changes.   Electronically Signed   By: Kalman Jewels M.D.   On: 01/14/2014 01:21     EKG Interpretation   Date/Time:  Thursday January 14 2014 00:19:18 EDT Ventricular Rate:  83 PR Interval:  184 QRS Duration: 84 QT Interval:  378 QTC Calculation: 444 R Axis:   83 Text Interpretation:  Normal sinus rhythm Possible Left atrial enlargement  Borderline ECG Since last tracing PR interval  has normalized, and T wave  abnormality has resolved Confirmed by Jalien Weakland  MD, Kenlei Safi ([id number]) on  01/14/2014 1:03:28 AM       Date: 01/23/14  0442-  Rate: 83  Rhythm: normal sinus rhythm  QRS Axis: normal  PR and QT Intervals: normal  ST/T Wave abnormalities: nonspecific T wave changes  PR and QRS Conduction Disutrbances:none  Narrative Interpretation:   Old EKG Reviewed: unchanged - cf earlier today    MDM   Final diagnoses:  Bronchitis    Evaluation is consistent with bronchitis. There is no evidence for pneumonia, seizures, bacterial infection, metabolic instability, fluid overload, pulmonary edema or metabolic instability  Nursing Notes Reviewed/ Care Coordinated Applicable Imaging Reviewed Interpretation of Laboratory Data incorporated into ED treatment  The patient appears reasonably screened and/or stabilized for discharge and I doubt any other medical condition or other [facility] requiring further screening, evaluation, or treatment in the ED at this time prior to discharge.  Plan: Home Medications- Levaquin, Prednisone; Home Treatments- rest; return here if the recommended treatment, does not improve the symptoms; Recommended follow up- PCP and Renal prn. Go to Dialysis today as scheduled.    Richarda Blade, MD 01/14/14 3343  Richarda Blade, MD 01/26/14 1535

## 2014-01-14 NOTE — ED Notes (Signed)
Pt transported to dialysis , report given to RN

## 2014-01-14 NOTE — Procedures (Signed)
I have personally attended this patient's dialysis session.   0K bath for 1st hour, hourly i-stat K and glucose No heparin Thigh AVG BFR 400 Stat renal panel is pending  Jamal Maes, MD [facility] [phone] Pager 01/14/2014, 6:57 PM

## 2014-01-14 NOTE — ED Notes (Signed)
MD at bedside  Inserting central line , md notified of Critical lab values

## 2014-01-14 NOTE — ED Provider Notes (Signed)
CSN: [id number]     Arrival date & time 01/14/14  1348 History   First MD Initiated Contact with Patient 01/14/14 1416     Chief Complaint  Patient presents with  . Migraine  . Hyperglycemia     (Consider location/radiation/quality/duration/timing/severity/associated sxs/prior Treatment) Patient is a 43 y.o. female presenting with general illness.  Illness Location:  Generalized Quality:  Malaise, dyspnea Severity:  Moderate Onset quality:  Gradual Duration:  1 day Timing:  Constant Progression:  Unchanged Chronicity:  New Context:  Seen this am with negative CXR.  Compliant with insulin Relieved by:  Nothing Worsened by:  Nothing Associated symptoms: cough   Associated symptoms: no abdominal pain, no chest pain, no congestion, no diarrhea, no fever, no nausea, no rash, no rhinorrhea, no shortness of breath, no sore throat and no vomiting     Past Medical History  Diagnosis Date  . Hyperlipidemia   . Alopecia   . Obesity   . Iron deficiency   . ESRD (end stage renal disease)     S/p pancreatic and kidney transplant, but back on HD 2008, dialysis at [facility], [place], Sat  . RLS (restless legs syndrome)   . Injury of conjunctiva and corneal abrasion of right eye without foreign body   . Cellulitis   . Anemia   . Blood transfusion 2004    "when I had my transplant"  . Migraine   . Hyperparathyroidism, secondary   . Diabetic retinopathy   . Hypertension     takes Metoprolol and Exforge daily  . Depression     takes Klonopin nightly  . Diabetes mellitus type 1     Pt states diagnosed at age 63 with prior episodes of DKA. S/p pancreatic transplant   . GERD (gastroesophageal reflux disease)     takes Protonix daily  . Bronchitis   . Migraine   . Asthma   . Emphysema of lung   . Angina     none in past 2 years.  . Stomach pain     Hx: of chronic  . Pneumonia 2012    "double"  . Dialysis patient     tues,thurs,sat  . 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  Surgery was done at [facility].      Past Surgical History  Procedure Laterality Date  . 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 Thyroglobulin      x 7  . Av fistula placement      right upper arm  . Cholecystectomy  1995  .  hd graft placement/removal      "had 2 in my left upper arm"  . Eye surgery    . Retinopathy surgery    . Tooth extraction  10/10/11    X's two  . Insertion of dialysis catheter  12/08/2011    Procedure: INSERTION OF DIALYSIS CATHETER;  Surgeon: Angelia Mould, MD;  Location: [place];  Service: Vascular;  Laterality: Left;  . Av fistula placement  01/22/2012    Procedure: INSERTION OF ARTERIOVENOUS (AV) GORE-TEX GRAFT ARM;  Surgeon: Angelia Mould, MD;  Location: [place];  Service: Vascular;  Laterality: Left;  Ultrasound guided.  . Av fistula placement  03/14/2012    Procedure: INSERTION OF  ARTERIOVENOUS (AV) GORE-TEX GRAFT THIGH;  Surgeon: Angelia Mould, MD;  Location: [place];  Service: Vascular;  Laterality: Right;  . False aneurysm repair Right 01/02/2013    Procedure: Excision of lymphocele in right thigh.;  Surgeon: Angelia Mould, MD;  Location: [place];  Service: Vascular;  Laterality: Right;  . Carpal tunnel release Right 03/02/2013    Procedure: CARPAL TUNNEL RELEASE;  Surgeon: Tennis Must, MD;  Location: [place];  Service: Orthopedics;  Laterality: Right;  . Flexible sigmoidoscopy N/A 03/24/2013    Procedure: FLEXIBLE SIGMOIDOSCOPY;  Surgeon: Cleotis Nipper, MD;  Location: [facility] ENDOSCOPY;  Service: Endoscopy;  Laterality: N/A;  . Revision of arteriovenous goretex graft Right 04/02/2013    Procedure: REPAIR OF PSEUDOANEURYSM OF ARTERIOVENOUS GORETEX GRAFT;  Surgeon: Angelia Mould, MD;  Location: [place];  Service: Vascular;  Laterality: Right;  . Carpal tunnel release Left 10/16/2013    Procedure: LEFT CARPAL TUNNEL RELEASE;  Surgeon: Tennis Must, MD;  Location: [place];  Service: Orthopedics;  Laterality: Left;   Family History  Problem Relation Age of Onset  . Hypertension Mother   . Kidney disease Mother   . Diabetes Mother   . Hypertension Father   . Kidney disease Father   . Diabetes Father    History  Substance Use Topics  . Smoking status: Never Smoker   . Smokeless tobacco: Never Used  . Alcohol Use: No   OB History   Grav Para Term Preterm Abortions TAB SAB Ect Mult Living                 Review of Systems  Constitutional: Negative for fever and chills.  HENT: Negative for congestion, rhinorrhea and sore throat.   Eyes: Negative for photophobia and visual disturbance.  Respiratory: Positive for cough. Negative for shortness of breath.   Cardiovascular: Negative for chest pain and leg swelling.  Gastrointestinal: Negative for nausea, vomiting, abdominal pain, diarrhea and constipation.  Endocrine: Negative for polyphagia and polyuria.  Genitourinary: Negative for dysuria, flank pain, vaginal bleeding, vaginal discharge and enuresis.  Musculoskeletal: Negative for back pain and gait problem.  Skin: Negative for color change and rash.  Neurological: Negative for dizziness, syncope, light-headedness and numbness.  Hematological: Negative for adenopathy. Does not bruise/bleed easily.  All other systems reviewed and are negative.     Allergies  Depakote; Morphine and related; Penicillins; Tramadol; Imitrex; and Vicodin  Home Medications   Prior to Admission medications   Medication Sig Start Date End Date Taking? Authorizing Provider  aspirin EC 81 MG tablet Take 81 mg by mouth daily.    Yes Historical Provider, MD  calcium acetate (PHOSLO) 667 MG capsule Take 2,001 mg by mouth 3 (three) times daily with meals.    Yes Historical Provider, MD  clonazePAM (KLONOPIN) 0.5 MG tablet Take 1 mg by mouth at bedtime.    Yes Historical Provider, MD  gabapentin (NEURONTIN) 100 MG capsule Take 100 mg by mouth daily.   Yes Historical Provider, MD  insulin aspart (NOVOLOG FLEXPEN) 100 UNIT/ML FlexPen Inject 6 Units into the skin 2 (two) times daily with a meal.   Yes Historical Provider, MD  Insulin Glargine (LANTUS) 100 UNIT/ML Solostar Pen Inject 7 Units into the skin 2 (two) times daily with a meal.   Yes Historical Provider, MD  levofloxacin (LEVAQUIN) 500 MG tablet Take 1 tablet (500 mg total) by mouth daily. Take 500mg  QOD beginning 01/15/14 01/14/14  Yes Vira Agar  Adolph Pollack, MD  metoCLOPramide (REGLAN) 5 MG tablet Take 1 tablet (5 mg total) by mouth 4 (four) times daily -  before meals and at bedtime. 12/01/13  Yes Janece Canterbury, MD  metoprolol succinate (TOPROL-XL) 100 MG 24 hr tablet Take 100 mg by mouth at bedtime. Take with or immediately following a meal.   Yes Historical Provider, MD  montelukast (SINGULAIR) 10 MG tablet Take 10 mg by mouth at bedtime.   Yes Historical Provider, MD  multivitamin (RENA-VIT) TABS tablet Take 1 tablet by mouth daily.   Yes Historical Provider, MD  ondansetron (ZOFRAN-ODT) 4 MG disintegrating tablet Take 1 tablet (4 mg total) by mouth every 6 (six) hours as needed for nausea or vomiting. 12/01/13  Yes Janece Canterbury, MD  pantoprazole (PROTONIX) 40 MG tablet Take 1 tablet (40 mg total) by mouth at bedtime. 03/27/13  Yes Hosie Poisson, MD  predniSONE (DELTASONE) 20 MG tablet Take 1 tablet (20 mg total) by mouth 2 (two) times daily. 01/14/14  Yes Richarda Blade, MD  zolpidem (AMBIEN CR) 12.5 MG CR tablet Take 12.5 mg by mouth at bedtime.   Yes Historical Provider, MD  albuterol (PROVENTIL HFA;VENTOLIN HFA) 108 (90 BASE) MCG/ACT inhaler Inhale 2 puffs into the lungs every 4 (four) hours as needed for wheezing or shortness of breath (as per home regimen). 02/20/13   Kinnie Feil, MD   lidocaine-prilocaine (EMLA) cream Apply 1 application topically 2 (two) times a week.  10/24/12   Historical Provider, MD   BP 113/56  Pulse 71  Temp(Src) 98.2 F (36.8 C) (Oral)  Resp 11  Ht 5\' 1"  (1.549 m)  Wt 154 lb 1.6 oz (69.9 kg)  BMI 29.13 kg/m2  SpO2 96%  LMP 12/26/2013 Physical Exam  Vitals reviewed. Constitutional: She is oriented to person, place, and time. She appears well-developed and well-nourished.  HENT:  Head: Normocephalic and atraumatic.  Right Ear: External ear normal.  Left Ear: External ear normal.  Eyes: Conjunctivae and EOM are normal. Pupils are equal, round, and reactive to light.  Neck: Normal range of motion. Neck supple.  Cardiovascular: Normal rate, regular rhythm, normal heart sounds and intact distal pulses.   Pulmonary/Chest: Effort normal and breath sounds normal.  Abdominal: Soft. Bowel sounds are normal. There is no tenderness.  Musculoskeletal: Normal range of motion.  Neurological: She is alert and oriented to person, place, and time.  Skin: Skin is warm and dry.    ED Course  CRITICAL CARE Performed by: Debby Freiberg Authorized by: Debby Freiberg Total critical care time: 70 minutes Critical care was necessary to treat or prevent imminent or life-threatening deterioration of the following conditions: metabolic crisis. Critical care was time spent personally by me on the following activities: examination of patient, evaluation of patient's response to treatment and interpretation of cardiac output measurements.  CENTRAL LINE Date/Time: 01/15/2014 1:50 PM Performed by: Debby Freiberg Authorized by: Debby Freiberg Consent: The procedure was performed in an emergent situation. Indications: vascular access Local anesthetic: lidocaine 1% without epinephrine Preparation: skin prepped with 2% chlorhexidine Skin prep agent dried: skin prep agent completely dried prior to procedure Sterile barriers: all five maximum sterile barriers  used - cap, mask, sterile gown, sterile gloves, and large sterile sheet Hand hygiene: hand hygiene performed prior to central venous catheter insertion Location details: right internal jugular Patient position: Trendelenburg Catheter type: triple lumen Pre-procedure: landmarks identified Ultrasound guidance: yes Number of attempts: 2 Successful placement: no Patient tolerance: Patient tolerated the procedure well with  no immediate complications.   (including critical care time) Labs Review Labs Reviewed  CBC WITH DIFFERENTIAL - Abnormal; Notable for the following:    MCV 102.0 (*)    RDW 15.7 (*)    Neutrophils Relative % 91 (*)    Lymphocytes Relative 8 (*)    Lymphs Abs 0.5 (*)    Monocytes Relative 1 (*)    All other components within normal limits  COMPREHENSIVE METABOLIC PANEL - Abnormal; Notable for the following:    Sodium 120 (*)    Potassium >7.7 (*)    Chloride 81 (*)    CO2 11 (*)    Glucose, Bld 829 (*)    BUN 77 (*)    Creatinine, Ser 12.78 (*)    Total Protein 8.4 (*)    Alkaline Phosphatase 167 (*)    GFR calc non Af Amer 3 (*)    GFR calc Af Amer 4 (*)    Anion gap 28 (*)    All other components within normal limits  RENAL FUNCTION PANEL - Abnormal; Notable for the following:    Sodium 134 (*)    Chloride 92 (*)    Glucose, Bld 285 (*)    BUN 27 (*)    Creatinine, Ser 5.17 (*)    GFR calc non Af Amer 9 (*)    GFR calc Af Amer 11 (*)    Anion gap 19 (*)    All other components within normal limits  RENAL FUNCTION PANEL - Abnormal; Notable for the following:    Sodium 135 (*)    Chloride 94 (*)    Glucose, Bld 429 (*)    BUN 26 (*)    Creatinine, Ser 6.43 (*)    Phosphorus 6.0 (*)    Albumin 3.1 (*)    GFR calc non Af Amer 7 (*)    GFR calc Af Amer 8 (*)    Anion gap 19 (*)    All other components within normal limits  CBC - Abnormal; Notable for the following:    RBC 3.44 (*)    Hemoglobin 11.2 (*)    HCT 34.0 (*)    RDW 15.6 (*)     Platelets 115 (*)    All other components within normal limits  GLUCOSE, CAPILLARY - Abnormal; Notable for the following:    Glucose-Capillary 131 (*)    All other components within normal limits  GLUCOSE, CAPILLARY - Abnormal; Notable for the following:    Glucose-Capillary 452 (*)    All other components within normal limits  GLUCOSE, CAPILLARY - Abnormal; Notable for the following:    Glucose-Capillary 275 (*)    All other components within normal limits  GLUCOSE, CAPILLARY - Abnormal; Notable for the following:    Glucose-Capillary 162 (*)    All other components within normal limits  GLUCOSE, CAPILLARY - Abnormal; Notable for the following:    Glucose-Capillary 110 (*)    All other components within normal limits  BASIC METABOLIC PANEL - Abnormal; Notable for the following:    Sodium 136 (*)    Chloride 94 (*)    Glucose, Bld 268 (*)    Creatinine, Ser 5.66 (*)    GFR calc non Af Amer 8 (*)    GFR calc Af Amer 10 (*)    Anion gap 18 (*)    All other components within normal limits  PHOSPHORUS - Abnormal; Notable for the following:    Phosphorus 4.8 (*)    All other  components within normal limits  BLOOD GAS, VENOUS - Abnormal; Notable for the following:    pH, Ven 7.355 (*)    pO2, Ven 69.7 (*)    Bicarbonate 24.9 (*)    All other components within normal limits  GLUCOSE, CAPILLARY - Abnormal; Notable for the following:    Glucose-Capillary 434 (*)    All other components within normal limits  GLUCOSE, CAPILLARY - Abnormal; Notable for the following:    Glucose-Capillary 429 (*)    All other components within normal limits  GLUCOSE, CAPILLARY - Abnormal; Notable for the following:    Glucose-Capillary 114 (*)    All other components within normal limits  CBG MONITORING, ED - Abnormal; Notable for the following:    Glucose-Capillary >600 (*)    All other components within normal limits  POCT I-STAT, CHEM 8 - Abnormal; Notable for the following:    Sodium 133 (*)     BUN 36 (*)    Creatinine, Ser 7.60 (*)    Glucose, Bld 375 (*)    Calcium, Ion 1.10 (*)    All other components within normal limits  MRSA PCR SCREENING  PROCALCITONIN  PROCALCITONIN  MAGNESIUM    Imaging Review Dg Chest 2 View  01/14/2014   CLINICAL DATA:  Chest pain and shortness of Breath.  EXAM: CHEST  2 VIEW  COMPARISON:  11/27/2013.  FINDINGS: The heart is mildly enlarged but stable. The mediastinal and hilar contours are unchanged. Persistent bronchitic changes but slight overall improved aeration. No pleural effusion. The bony thorax is intact.  IMPRESSION: Stable cardiac enlargement and chronic bronchitic changes.   Electronically Signed   By: Kalman Jewels M.D.   On: 01/14/2014 01:21     EKG Interpretation None      MDM   Final diagnoses:  Diabetic ketoacidosis without coma associated with diabetes mellitus due to underlying condition  ESRD (end stage renal disease)  Hyperkalemia  V-tach  Nausea and vomiting, vomiting of unspecified type  Type 1 diabetes mellitus with nephropathy    43 y.o. female  with pertinent PMH of ESRD, DM, anemia presents with malaise, cough.  Patient did not dialyze today.  She has had a cough which is productive of yellow sputum for the past 2-3 days. On arrival she states that she feels generally short of breath and malaise, however denies specific chest pain, or pain elsewhere. Her vital signs on arrival were as above, unremarkable. Physical exam demonstrated no acute abnormalities.  IV access delayed DVT patient vasculopathy, during IV access patient had a run of V. tach, and nausea, and was given calcium chloride per the newly established peripheral IV, which improved her symptoms.  No chest pain.    This continued to recur over the next hour requiring 3 doses of calcium.  Ultimately she was given bicarb and insulin with improvement in her symptoms and stabilization.  I attempted to place a R IJ central line, however I was unable to pass the  wire despite great blood return.  I Utilized [place] and verified no thrombus prior to procedure.  Critical care and nephrology were consulted, and the pt was taken emergently to dialysis.  The pt required signifcant amount of reassessment due to potential for detioration and consult discussion.  Labs and imaging as above reviewed.   1. Diabetic ketoacidosis without coma associated with diabetes mellitus due to underlying condition   2. ESRD (end stage renal disease)   3. Hyperkalemia   4. V-tach   5.  Nausea and vomiting, vomiting of unspecified type   6. Type 1 diabetes mellitus with nephropathy         Debby Freiberg, MD 01/15/14 1351

## 2014-01-14 NOTE — ED Notes (Signed)
C/o" lower back pain" 

## 2014-01-14 NOTE — Progress Notes (Signed)
ANTIBIOTIC CONSULT NOTE - INITIAL  Pharmacy Consult for levaquin Indication: bronchitis  Allergies  Allergen Reactions  . Depakote [Divalproex Sodium] Other (See Comments)    Pt gets "delirious"  . Morphine And Related Nausea And Vomiting and Other (See Comments)    "makes me delirious"  . Penicillins Anaphylaxis    Tolerated Zosyn Dec 2014  . Tramadol Nausea And Vomiting  . Imitrex [Sumatriptan] Other (See Comments)    seizures  . Vicodin [Hydrocodone-Acetaminophen] Itching and Rash    Patient Measurements: Height: 5\' 1"  (154.9 cm) Weight: 150 lb (68.04 kg) IBW/kg (Calculated) : 47.8   Vital Signs: Temp: 98.4 F (36.9 C) (08/20 0019) Temp src: Oral (08/20 0019) BP: 152/74 mmHg (08/20 0319) Pulse Rate: 77 (08/20 0319) Intake/Output from previous day:   Intake/Output from this shift:    Labs:  Recent Labs  01/14/14 0216 01/14/14 0223  WBC 5.6  --   HGB 11.6* 13.3  PLT 156  --   CREATININE 12.17* 13.30*   Estimated Creatinine Clearance: 4.9 ml/min (by C-G formula based on Cr of 13.3). No results found for this basename: VANCOTROUGH, VANCOPEAK, VANCORANDOM, GENTTROUGH, GENTPEAK, GENTRANDOM, TOBRATROUGH, TOBRAPEAK, TOBRARND, AMIKACINPEAK, AMIKACINTROU, AMIKACIN,  in the last 72 hours   Microbiology: No results found for this or any previous visit (from the past 720 hour(s)).  Medical History: Past Medical History  Diagnosis Date  . Hyperlipidemia   . Alopecia   . Obesity   . Iron deficiency   . ESRD (end stage renal disease)     S/p pancreatic and kidney transplant, but back on HD 2008, dialysis at [facility], [place], Sat  . RLS (restless legs syndrome)   . Injury of conjunctiva and corneal abrasion of right eye without foreign body   . Cellulitis   . Anemia   . Blood transfusion 2004    "when I had my transplant"  . Migraine   . Hyperparathyroidism, secondary   . Diabetic retinopathy   . Hypertension     takes Metoprolol and Exforge daily   . Depression     takes Klonopin nightly  . Diabetes mellitus type 1     Pt states diagnosed at age 7 with prior episodes of DKA. S/p pancreatic transplant   . GERD (gastroesophageal reflux disease)     takes Protonix daily  . Bronchitis   . Migraine   . Asthma   . Emphysema of lung   . Angina     none in past 2 years.  . Stomach pain     Hx: of chronic  . Pneumonia 2012    "double"  . Dialysis patient     tues,thurs,sat  . 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  Surgery was done at [facility].      Assessment: 43 year old ESRD patient with bronchitis. Orders for levaquin dosing per pharmacy. No fevers, wbc normal.  No further dose adjustment needed, will sign off and follow peripherally.  Goal of Therapy:  Eradication of infection  Plan:  Levaquin 750 x1 then [place] PharmD., BCPS Clinical Pharmacist Pager [phone] 01/14/2014 3:31 AM

## 2014-01-14 NOTE — Consult Note (Signed)
Requesting Physician:  EDP Reason for Consult:  ESRD, life threatening hyperkalemia, uncontrolled DM HPI: The patient is a 43 y.o. year-old AAF with IDDM, h/o failed kidney-pancreas transplant, on HD TTS at [facility] (last HD was on Tuesday).  She has had frequent admission for uncontrolled DM/DKA and upper respiratory infections. She has been on home oxygen since her admission in July, when she briefly required intubation.  Has very brittle DM. Has a new endocrinologist in [facility] that she is to see later this month. A1C at the HD unit 12/17/13 was 11.  Compliance issues have played a role in the past.  States her current insulin doses are Lantus 6AM, 7PM + novolog.   She was in the ED early this AM "not feeling good" and SOB. CXR showed "chronic bronchitic changes" . Appears she was given a dose of levaquin and was discharged to home with a blood sugar of 112 and a potassium of 4.9.  She went home (did not go to dialysis because rides transportation and had no way to get there), took on 3 units of Lantus (usually takes 6) and 6 units of novolog. Did not eat anything  Came back to the ED because she "felt really bad" (told the EDP she had leg pain and a migraine) and had a BS of 829 and potassium >7.7. Had some VTach in the ED and received calcium and 6 units of novolog.  She is brought up for urgent dialysis.  At this time, she is alert, awake, talking, animated, having no distress of any kind.  Past Medical History:  Past Medical History  Diagnosis Date  . Hyperlipidemia   . Alopecia   . Obesity   . Iron deficiency   . ESRD (end stage renal disease)     S/p pancreatic and kidney transplant, but back on HD 2008, dialysis at [facility], [place], Sat  . RLS (restless legs syndrome)   . Injury of conjunctiva and corneal abrasion of right eye without foreign body   . Cellulitis   . Anemia   . Blood transfusion 2004    "when I had my transplant"  . Migraine   . Hyperparathyroidism,  secondary   . Diabetic retinopathy   . Hypertension     takes Metoprolol and Exforge daily  . Depression     takes Klonopin nightly  . Diabetes mellitus type 1     Pt states diagnosed at age 89 with prior episodes of DKA. S/p pancreatic transplant   . GERD (gastroesophageal reflux disease)     takes Protonix daily  . Bronchitis   . Migraine   . Asthma   . Emphysema of lung   . Angina     none in past 2 years.  . Stomach pain     Hx: of chronic  . Pneumonia 2012    "double"  . Dialysis patient     tues,thurs,sat  . 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  Surgery was done at [facility].       Past Surgical History:  Past Surgical History  Procedure Laterality Date  . 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 Thyroglobulin      x 7  . Av fistula placement  right upper arm  . Cholecystectomy  1995  .  hd graft placement/removal      "had 2 in my left upper arm"  . Eye surgery    . Retinopathy surgery    . Tooth extraction  10/10/11    X's two  . Insertion of dialysis catheter  12/08/2011    Procedure: INSERTION OF DIALYSIS CATHETER;  Surgeon: Angelia Mould, MD;  Location: [facility];  Service: Vascular;  Laterality: Left;  . Av fistula placement  01/22/2012    Procedure: INSERTION OF ARTERIOVENOUS (AV) GORE-TEX GRAFT ARM;  Surgeon: Angelia Mould, MD;  Location: [place];  Service: Vascular;  Laterality: Left;  Ultrasound guided.  . Av fistula placement  03/14/2012    Procedure: INSERTION OF ARTERIOVENOUS (AV) GORE-TEX GRAFT THIGH;  Surgeon: Angelia Mould, MD;  Location: [place];  Service: Vascular;  Laterality: Right;  . False aneurysm repair Right 01/02/2013    Procedure: Excision of lymphocele in right thigh.;  Surgeon:  Angelia Mould, MD;  Location: [place];  Service: Vascular;  Laterality: Right;  . Carpal tunnel release Right 03/02/2013    Procedure: CARPAL TUNNEL RELEASE;  Surgeon: Tennis Must, MD;  Location: [place];  Service: Orthopedics;  Laterality: Right;  . Flexible sigmoidoscopy N/A 03/24/2013    Procedure: FLEXIBLE SIGMOIDOSCOPY;  Surgeon: Cleotis Nipper, MD;  Location: [facility];  Service: Endoscopy;  Laterality: N/A;  . Revision of arteriovenous goretex graft Right 04/02/2013    Procedure: REPAIR OF PSEUDOANEURYSM OF ARTERIOVENOUS GORETEX GRAFT;  Surgeon: Angelia Mould, MD;  Location: [place];  Service: Vascular;  Laterality: Right;  . Carpal tunnel release Left 10/16/2013    Procedure: LEFT CARPAL TUNNEL RELEASE;  Surgeon: Tennis Must, MD;  Location: [place];  Service: Orthopedics;  Laterality: Left;    Family History:  Family History  Problem Relation Age of Onset  . Hypertension Mother   . Kidney disease Mother   . Diabetes Mother   . Hypertension Father   . Kidney disease Father   . Diabetes Father    Social History:  reports that she has never smoked. She has never used smokeless tobacco. She reports that she does not drink alcohol or use illicit drugs.  Allergies:  Allergies  Allergen Reactions  . Depakote [Divalproex Sodium] Other (See Comments)    Pt gets "delirious"  . Morphine And Related Nausea And Vomiting and Other (See Comments)    "makes me delirious"  . Penicillins Anaphylaxis    Tolerated Zosyn Dec 2014  . Tramadol Nausea And Vomiting  . Imitrex [Sumatriptan] Other (See Comments)    seizures  . Vicodin [Hydrocodone-Acetaminophen] Itching and Rash    Home medications: Prior to Admission medications   Medication Sig Start Date End Date Taking? Authorizing Provider  aspirin EC 81 MG tablet Take 81 mg by mouth daily.    Yes Historical Provider, MD  calcium acetate (PHOSLO) 667 MG capsule Take 2,001 mg by mouth 3  (three) times daily with meals.   Yes Historical Provider, MD  clonazePAM (KLONOPIN) 0.5 MG tablet Take 1 mg by mouth at bedtime.    Yes Historical Provider, MD  gabapentin (NEURONTIN) 100 MG capsule Take 100 mg by mouth daily.   Yes Historical Provider, MD  insulin aspart (NOVOLOG FLEXPEN) 100 UNIT/ML FlexPen Inject 6 Units into the skin 2 (two) times daily with a meal.   Yes Historical Provider, MD  Insulin Glargine (LANTUS) 100 UNIT/ML Solostar  Pen Inject 7 Units into the skin 2 (two) times daily with a meal.   Yes Historical Provider, MD  levofloxacin (LEVAQUIN) 500 MG tablet Take 1 tablet (500 mg total) by mouth daily. Take 500mg  QOD beginning 01/15/14 01/14/14  Yes Richarda Blade, MD  metoCLOPramide (REGLAN) 5 MG tablet Take 1 tablet (5 mg total) by mouth 4 (four) times daily -  before meals and at bedtime. 12/01/13  Yes Janece Canterbury, MD  metoprolol succinate (TOPROL-XL) 100 MG 24 hr tablet Take 100 mg by mouth at bedtime. Take with or immediately following a meal.   Yes Historical Provider, MD  montelukast (SINGULAIR) 10 MG tablet Take 10 mg by mouth at bedtime.   Yes Historical Provider, MD  multivitamin (RENA-VIT) TABS tablet Take 1 tablet by mouth daily.   Yes Historical Provider, MD  ondansetron (ZOFRAN-ODT) 4 MG disintegrating tablet Take 1 tablet (4 mg total) by mouth every 6 (six) hours as needed for nausea or vomiting. 12/01/13  Yes Janece Canterbury, MD  pantoprazole (PROTONIX) 40 MG tablet Take 1 tablet (40 mg total) by mouth at bedtime. 03/27/13  Yes Hosie Poisson, MD  predniSONE (DELTASONE) 20 MG tablet Take 1 tablet (20 mg total) by mouth 2 (two) times daily. 01/14/14  Yes Richarda Blade, MD  zolpidem (AMBIEN CR) 12.5 MG CR tablet Take 12.5 mg by mouth at bedtime.   Yes Historical Provider, MD  albuterol (PROVENTIL HFA;VENTOLIN HFA) 108 (90 BASE) MCG/ACT inhaler Inhale 2 puffs into the lungs every 4 (four) hours as needed for wheezing or shortness of breath (as per home regimen). 02/20/13    Kinnie Feil, MD  lidocaine-prilocaine (EMLA) cream Apply 1 application topically 2 (two) times a week.  10/24/12   Historical Provider, MD    Inpatient medications: . calcium chloride  0.5 g Intravenous Once  . sodium bicarbonate  50 mEq Intravenous Once    Review of Systems Gen:  + headache ("I get migraines) No feveres or chills HEENT:  No visual change, sore throat, difficulty swallowing. Resp:  + SOB, non-productive cough Cardiac:  No chest pain, orthopnea, PND.  Denies edema. GI:   Denies abdominal pain.   No nausea, vomiting, diarrhea.  No constipation. GU:  Does not make urine    MS:  Denies joint pain or swelling.  + low back pain Derm:  Denies skin rash or itching.  No chronic skin conditions.  Neuro:   Denies focal weakness, memory problems, hx stroke or TIA.   Psych:  Denies symptoms of depression of anxiety.  No hallucination.     Physical Exam:  BP 183/63  Pulse 86  Temp(Src) 97.8 F (36.6 C) (Oral)  Resp 22  SpO2 98%  LMP 12/26/2013 Gen: Awake, alert, animated.  Bed covered in blood from failed central line attempts Skin: no rash, cyanosis  + tattoos Neck: no JVD, no bruits or LAN Chest: Breath sounds symmetric.  No crackles or wheezes Heart: regular, no rub or gallop S1S2 No S3 Abdomen: soft, no focal abd tenderness.  Scars from prev K-P transplant Ext: Right thigh AVG patent.  Trace RLE, no LLE edema Neuro: alert, Ox3, no focal deficit Heme/Lymph: no bruising or LAN  Labs: Basic Metabolic Panel:  Recent Labs Lab 01/14/14 0216 01/14/14 0223 01/14/14 1452  NA 133* 130* 120*  K 5.3 4.9 >7.7*  CL 92* 101 81*  CO2 21  --  11*  GLUCOSE 110* 112* 829*  BUN 65* 58* 77*  CREATININE 12.17* 13.30* 12.78*  CALCIUM 8.5  --  8.4    Recent Labs Lab 01/14/14 1452  AST 13  ALT 8  ALKPHOS 167*  BILITOT 0.4  PROT 8.4*  ALBUMIN 3.7   Recent Labs Lab 01/14/14 0216 01/14/14 0223 01/14/14 1452  WBC 5.6  --  6.0  NEUTROABS  --   --  5.4  HGB  11.6* 13.3 13.4  HCT 34.6* 39.0 41.6  MCV 98.0  --  102.0*  PLT 156  --  169   PT/INR: @labrcntip (inr:5) Cardiac Enzymes: No results found for this basename: CKTOTAL, CKMB, CKMBINDEX, TROPONINI,  in the last 168 hours CBG:  Recent Labs Lab 01/14/14 1410  GLUCAP >600*    Iron Studies: No results found for this basename: IRON, TIBC, TRANSFERRIN, FERRITIN,  in the last 168 hours  Xrays/Other Studies: Dg Chest 2 View  01/14/2014   CLINICAL DATA:  Chest pain and shortness of Breath.  EXAM: CHEST  2 VIEW  COMPARISON:  11/27/2013.  FINDINGS: The heart is mildly enlarged but stable. The mediastinal and hilar contours are unchanged. Persistent bronchitic changes but slight overall improved aeration. No pleural effusion. The bony thorax is intact.  IMPRESSION: Stable cardiac enlargement and chronic bronchitic changes.   Electronically Signed   By: Kalman Jewels M.D.   On: 01/14/2014 01:21   Dialysis prescription TTS [facility] 3 hours 45 minutesEDW 67.5 kg 2K2.5Ca Right thigh AVG 15 gu needles Hectorol 4 mcg TIW Venofer 50 mg QThurs Aranesp 100 QWeek(Tues) No heparin d/t issues with prolonged bleeding CBG QTMT and if BS >500 pt gives self 3 U novolog   Impression/Plan 1. Anion gap acidosis and life threatening hyperkalemia with uncontrolled DM- c/w DKA.  Potassium should come down with insulin/control of BS but HD will speed the process along.  Unclear why such a dramatic change in 12 hours - she did take SOME insulin after discharge from the ED this AM (less than half of her prescribed dose of lantus, but did take 6 units of novolog). Historically extremely brittle.  Stat renal panel now at initiation of HD, check i-stat K and CBG's hourly (call K to Dr. Mercy Moore and blood sugar to Dr. Lake Bells)  2. ESRD - TTS HD. Urgent HD now. 0K bath pending stat renal panel sent in HD. istat K after 1st hour). Labs AM 3. SOB - CXR most c/w bronchitic changes.  Volume overload not  significant by exam. ATB's per CCM 4. DM - Insulin per CCM 5. HTN - meds 6. Secondary HPT - hectorol and binders 7. Anemia - Aranesp per outpt dosing  Jamal Maes,  MD [facility] [phone] pager 01/14/2014, 6:17 PM

## 2014-01-14 NOTE — ED Notes (Signed)
Pt monitored by pulse ox, bp cuff, and 5-lead. 

## 2014-01-14 NOTE — H&P (Signed)
PULMONARY / CRITICAL CARE MEDICINE   Name: [name] MRN: [id number] DOB: 1971/03/10    ADMISSION DATE:  01/14/2014 CONSULTATION DATE:  01/14/2014  REFERRING MD :  EDP  CHIEF COMPLAINT:  "I feel really bad"  INITIAL PRESENTATION: 43 y/o female with ESRD admitted to [facility] on 8/20 for hyperkalemia and DKA requiring emergent dialysis.  At baseline she is a brittle diabetic with multiple recent admissions for glucose management.  In ED had brief run of VTach.  STUDIES:   SIGNIFICANT EVENTS: 8/20 emergent HD   HISTORY OF PRESENT ILLNESS:  This is a 42 y/o female who has been admitted here multiple times recently for management of various infections (recent HCAP with septic shock requiring mechanical ventilation) and very poor glucose control.  This week she has been struggling with headache, fatigue, nausea, vomiting and muscle aches.  She came to the [facility] ED around midnight on 8/20 (early AM) complaining of dyspnea.  She was discharged home on Levaquin and prednisone 20mg  tablets for what was felt to be bronchitis.  At that point her blood sugar and potassium were normal.  She took a lower dose than normal of glargine (only 3 units) because she was throwing up and could not eat.  She returned to the ED later in the afternoon on 8/20 complaining of "feeling really bad".  She was found to have a glucose > 800 and a potassium > 7.7.  Apparently she had VTach captured on tele.  She was given insulin IV and calcium.  The nephrology service saw her and arranged emergent hemodialysis. She dialyzes TTS.  Her last dialysis session was on 8/18.  She had a history of non-compliance and a failed kindey and pancreas transplant.  She currently denies trouble breathing.  PAST MEDICAL HISTORY :  Past Medical History  Diagnosis Date  . Hyperlipidemia   . Alopecia   . Obesity   . Iron deficiency   . ESRD (end stage renal disease)     S/p pancreatic and kidney transplant, but back on HD 2008, dialysis at  [facility], [place], Sat  . RLS (restless legs syndrome)   . Injury of conjunctiva and corneal abrasion of right eye without foreign body   . Cellulitis   . Anemia   . Blood transfusion 2004    "when I had my transplant"  . Migraine   . Hyperparathyroidism, secondary   . Diabetic retinopathy   . Hypertension     takes Metoprolol and Exforge daily  . Depression     takes Klonopin nightly  . Diabetes mellitus type 1     Pt states diagnosed at age 7 with prior episodes of DKA. S/p pancreatic transplant   . GERD (gastroesophageal reflux disease)     takes Protonix daily  . Bronchitis   . Migraine   . Asthma   . Emphysema of lung   . Angina     none in past 2 years.  . Stomach pain     Hx: of chronic  . Pneumonia 2012    "double"  . Dialysis patient     tues,thurs,sat  . 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  Surgery was done at [facility].      Past Surgical History  Procedure Laterality Date  . 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 Thyroglobulin      x 7  . Av fistula placement      right upper arm  . Cholecystectomy  1995  .  hd graft placement/removal      "had 2 in my left upper arm"  . Eye surgery    . Retinopathy surgery    . Tooth extraction  10/10/11    X's two  . Insertion of dialysis catheter  12/08/2011    Procedure: INSERTION OF DIALYSIS CATHETER;  Surgeon: Angelia Mould, MD;  Location: [place];  Service: Vascular;  Laterality: Left;  . Av fistula placement  01/22/2012    Procedure: INSERTION OF ARTERIOVENOUS (AV) GORE-TEX GRAFT ARM;  Surgeon: Angelia Mould, MD;  Location: [place];  Service: Vascular;  Laterality: Left;  Ultrasound guided.  . Av fistula placement  03/14/2012    Procedure: INSERTION  OF ARTERIOVENOUS (AV) GORE-TEX GRAFT THIGH;  Surgeon: Angelia Mould, MD;  Location: [place];  Service: Vascular;  Laterality: Right;  . False aneurysm repair Right 01/02/2013    Procedure: Excision of lymphocele in right thigh.;  Surgeon: Angelia Mould, MD;  Location: [place];  Service: Vascular;  Laterality: Right;  . Carpal tunnel release Right 03/02/2013    Procedure: CARPAL TUNNEL RELEASE;  Surgeon: Tennis Must, MD;  Location: [place];  Service: Orthopedics;  Laterality: Right;  . Flexible sigmoidoscopy N/A 03/24/2013    Procedure: FLEXIBLE SIGMOIDOSCOPY;  Surgeon: Cleotis Nipper, MD;  Location: [facility] ENDOSCOPY;  Service: Endoscopy;  Laterality: N/A;  . Revision of arteriovenous goretex graft Right 04/02/2013    Procedure: REPAIR OF PSEUDOANEURYSM OF ARTERIOVENOUS GORETEX GRAFT;  Surgeon: Angelia Mould, MD;  Location: [place];  Service: Vascular;  Laterality: Right;  . Carpal tunnel release Left 10/16/2013    Procedure: LEFT CARPAL TUNNEL RELEASE;  Surgeon: Tennis Must, MD;  Location: [place];  Service: Orthopedics;  Laterality: Left;   Prior to Admission medications   Medication Sig Start Date End Date Taking? Authorizing Provider  aspirin EC 81 MG tablet Take 81 mg by mouth daily.    Yes Historical Provider, MD  calcium acetate (PHOSLO) 667 MG capsule Take 2,001 mg by mouth 3 (three) times daily with meals.   Yes Historical Provider, MD  clonazePAM (KLONOPIN) 0.5 MG tablet Take 1 mg by mouth at bedtime.    Yes Historical Provider, MD  gabapentin (NEURONTIN) 100 MG capsule Take 100 mg by mouth daily.   Yes Historical Provider, MD  insulin aspart (NOVOLOG FLEXPEN) 100 UNIT/ML FlexPen Inject 6 Units into the skin 2 (two) times daily with a meal.   Yes Historical Provider, MD  Insulin Glargine (LANTUS) 100 UNIT/ML Solostar Pen Inject 7 Units into the skin 2 (two) times daily with a meal.   Yes Historical Provider, MD  levofloxacin (LEVAQUIN)  500 MG tablet Take 1 tablet (500 mg total) by mouth daily. Take 500mg  QOD beginning 01/15/14 01/14/14  Yes Richarda Blade, MD  metoCLOPramide (REGLAN) 5 MG tablet Take 1 tablet (5 mg total) by mouth 4 (four) times daily -  before meals and at bedtime. 12/01/13  Yes Janece Canterbury, MD  metoprolol succinate (TOPROL-XL) 100 MG 24 hr tablet Take 100 mg by mouth at bedtime. Take with or immediately following a meal.   Yes Historical Provider, MD  montelukast (SINGULAIR) 10 MG tablet Take 10 mg by mouth at bedtime.  Yes Historical Provider, MD  multivitamin (RENA-VIT) TABS tablet Take 1 tablet by mouth daily.   Yes Historical Provider, MD  ondansetron (ZOFRAN-ODT) 4 MG disintegrating tablet Take 1 tablet (4 mg total) by mouth every 6 (six) hours as needed for nausea or vomiting. 12/01/13  Yes Janece Canterbury, MD  pantoprazole (PROTONIX) 40 MG tablet Take 1 tablet (40 mg total) by mouth at bedtime. 03/27/13  Yes Hosie Poisson, MD  predniSONE (DELTASONE) 20 MG tablet Take 1 tablet (20 mg total) by mouth 2 (two) times daily. 01/14/14  Yes Richarda Blade, MD  zolpidem (AMBIEN CR) 12.5 MG CR tablet Take 12.5 mg by mouth at bedtime.   Yes Historical Provider, MD  albuterol (PROVENTIL HFA;VENTOLIN HFA) 108 (90 BASE) MCG/ACT inhaler Inhale 2 puffs into the lungs every 4 (four) hours as needed for wheezing or shortness of breath (as per home regimen). 02/20/13   Kinnie Feil, MD  lidocaine-prilocaine (EMLA) cream Apply 1 application topically 2 (two) times a week.  10/24/12   Historical Provider, MD   Allergies  Allergen Reactions  . Depakote [Divalproex Sodium] Other (See Comments)    Pt gets "delirious"  . Morphine And Related Nausea And Vomiting and Other (See Comments)    "makes me delirious"  . Penicillins Anaphylaxis    Tolerated Zosyn Dec 2014  . Tramadol Nausea And Vomiting  . Imitrex [Sumatriptan] Other (See Comments)    seizures  . Vicodin [Hydrocodone-Acetaminophen] Itching and Rash    FAMILY  HISTORY:  Family History  Problem Relation Age of Onset  . Hypertension Mother   . Kidney disease Mother   . Diabetes Mother   . Hypertension Father   . Kidney disease Father   . Diabetes Father    SOCIAL HISTORY:  reports that she has never smoked. She has never used smokeless tobacco. She reports that she does not drink alcohol or use illicit drugs.  REVIEW OF SYSTEMS:   Gen: Denies fever, NOTES chills, weight change, NOTES fatigue, night sweats HEENT: Denies blurred vision, double vision, hearing loss, tinnitus, sinus congestion, rhinorrhea, sore throat, neck stiffness, dysphagia PULM: Denies shortness of breath, cough, sputum production, hemoptysis, wheezing CV: Denies chest pain, edema, orthopnea, paroxysmal nocturnal dyspnea, palpitations GI: Denies abdominal pain, + nausea,  +vomiting, diarrhea, hematochezia, melena, constipation, change in bowel habits GU: Denies dysuria, hematuria, polyuria, oliguria, urethral discharge Endocrine: Denies hot or cold intolerance, polyuria, polyphagia or appetite change Derm: Denies rash, dry skin, scaling or peeling skin change Heme: Denies easy bruising, bleeding, bleeding gums Neuro: +headache, denies numbness, weakness, slurred speech, loss of memory or consciousness   SUBJECTIVE:   VITAL SIGNS: Temp:  [97.8 F (36.6 C)-98.4 F (36.9 C)] 97.8 F (36.6 C) (08/20 1406) Pulse Rate:  [76-90] 86 (08/20 1505) Resp:  [14-22] 22 (08/20 1505) BP: (120-183)/(57-82) 183/63 mmHg (08/20 1505) SpO2:  [94 %-100 %] 98 % (08/20 1505) Weight:  [68.04 kg (150 lb)] 68.04 kg (150 lb) (08/20 0019) HEMODYNAMICS:   VENTILATOR SETTINGS:   INTAKE / OUTPUT: No intake or output data in the 24 hours ending 01/14/14 1836  PHYSICAL EXAMINATION: Gen: well appearing, no acute distress HEENT: NCAT, strabysmus, EOMi, OP clear, neck supple without masses PULM: CTA B CV: RRR, systolic murmur noted, no JVD; right femoral graft with good thrill AB: BS+, soft,  nontender, no hsm Ext: warm, no edema, no clubbing, no cyanosis Derm: multiple tattoos, no rash or skin breakdown Neuro: A&Ox4, CN II-XII intact, strength 5/5 in all 4 extremities  LABS:  CBC  Recent Labs Lab 01/14/14 0216 01/14/14 0223 01/14/14 1452  WBC 5.6  --  6.0  HGB 11.6* 13.3 13.4  HCT 34.6* 39.0 41.6  PLT 156  --  169   Coag's No results found for this basename: APTT, INR,  in the last 168 hours BMET  Recent Labs Lab 01/14/14 0216 01/14/14 0223 01/14/14 1452  NA 133* 130* 120*  K 5.3 4.9 >7.7*  CL 92* 101 81*  CO2 21  --  11*  BUN 65* 58* 77*  CREATININE 12.17* 13.30* 12.78*  GLUCOSE 110* 112* 829*   Electrolytes  Recent Labs Lab 01/14/14 0216 01/14/14 1452  CALCIUM 8.5 8.4   Sepsis Markers No results found for this basename: LATICACIDVEN, PROCALCITON, O2SATVEN,  in the last 168 hours ABG No results found for this basename: PHART, PCO2ART, PO2ART,  in the last 168 hours Liver Enzymes  Recent Labs Lab 01/14/14 1452  AST 13  ALT 8  ALKPHOS 167*  BILITOT 0.4  ALBUMIN 3.7   Cardiac Enzymes  Recent Labs Lab 01/14/14 0216  PROBNP 17541.0*   Glucose  Recent Labs Lab 01/14/14 1410  GLUCAP >600*    Imaging No results found.   ASSESSMENT / PLAN:  RENAL A:   ESRD Hyperkalemia Increased anion gap likely due to DKA Pseduohyponatremia Failed kidney/pancreas transplant P:   See endocrine Stat HD per renal BMET monitoring all night with DKA treatment Gentle NS IVF  ENDOCRINE A:  DKA > likely caused by prednisone, poor po intake, intentionally taking lower dose of glargine P:   Will use DKA protocol as she did well with that in July admission BMET q2h IVF > gentle Careful transitioning to sub cutaneous insuline considering brittle state  PULMONARY OETT N/A A: History of asthma and recent diagnosis of bronchitis, but currently breathing/oxygenating comfortably with clear lungs P:   Continue home prn albuterol Stop  steroids Hold levaquin  CARDIOVASCULAR CVL N/A A: VTach in ED?, cannot find strip Hypertension P:  Emergent HD considering elevated K Restart home metoprolol  GASTROINTESTINAL A:  Nausea and vomiting in settting of DKA P:   NPO, Advance diet when gap closes Continue home PPI  HEMATOLOGIC A:  No acute issues P:  Monitor glucose  INFECTIOUS A:  Viral prodrome (headache, body aches, nausea) P:   Monitor for fever Monitor cbc Hold antibiotics  NEUROLOGIC A:  Headache P:   Prn oxycodone  TODAY'S SUMMARY: 43 y/o female with DKA from prednisone and poor po intake in the setting of a likely viral prodrome.  Needs emergent HD for DKA.  I have personally obtained a history, examined the patient, evaluated laboratory and imaging results, formulated the assessment and plan and placed orders. CRITICAL CARE: The patient is critically ill with multiple organ systems failure and requires high complexity decision making for assessment and support, frequent evaluation and titration of therapies, application of advanced monitoring technologies and extensive interpretation of multiple databases. Critical Care Time devoted to patient care services described in this note is 60 minutes.    Roselie Awkward, MD [place] [facility] Pager: [phone] Cell: [phone] If no response, call [phone]   01/14/2014, 6:36 PM

## 2014-01-14 NOTE — ED Notes (Signed)
Unable to obtain IV access , MD notified

## 2014-01-14 NOTE — ED Notes (Signed)
Checked patients blood sugar it read greater then 600 notified RN Olivia Mackie of blood sugar

## 2014-01-14 NOTE — ED Notes (Signed)
Sob x 1 week. Intermittently uses 1 l [place] oxygen at home.  Hx. Of pna. Pt. On dialysis. Last dialysis Tuesday.

## 2014-01-14 NOTE — Discharge Instructions (Signed)
Use your inhaler, 2 puffs, every 4 hours as needed for cough or trouble breathing. Start taking the prednisone, prescription today, and the Levaquin prescription, tomorrow. Followup for your usual dialysis treatments, as scheduled.       Acute Bronchitis Bronchitis is inflammation of the airways that extend from the windpipe into the lungs (bronchi). The inflammation often causes mucus to develop. This leads to a cough, which is the most common symptom of bronchitis.  In acute bronchitis, the condition usually develops suddenly and goes away over time, usually in a couple weeks. Smoking, allergies, and asthma can make bronchitis worse. Repeated episodes of bronchitis may cause further lung problems.  CAUSES Acute bronchitis is most often caused by the same virus that causes a cold. The virus can spread from person to person (contagious) through coughing, sneezing, and touching contaminated objects. SIGNS AND SYMPTOMS   Cough.   Fever.   Coughing up mucus.   Body aches.   Chest congestion.   Chills.   Shortness of breath.   Sore throat.  DIAGNOSIS  Acute bronchitis is usually diagnosed through a physical exam. Your health care provider will also ask you questions about your medical history. Tests, such as chest X-rays, are sometimes done to rule out other conditions.  TREATMENT  Acute bronchitis usually goes away in a couple weeks. Oftentimes, no medical treatment is necessary. Medicines are sometimes given for relief of fever or cough. Antibiotic medicines are usually not needed but may be prescribed in certain situations. In some cases, an inhaler may be recommended to help reduce shortness of breath and control the cough. A cool mist vaporizer may also be used to help thin bronchial secretions and make it easier to clear the chest.  HOME CARE INSTRUCTIONS  Get plenty of rest.   Drink enough fluids to keep your urine clear or pale yellow (unless you have a medical  condition that requires fluid restriction). Increasing fluids may help thin your respiratory secretions (sputum) and reduce chest congestion, and it will prevent dehydration.   Take medicines only as directed by your health care provider.  If you were prescribed an antibiotic medicine, finish it all even if you start to feel better.  Avoid smoking and secondhand smoke. Exposure to cigarette smoke or irritating chemicals will make bronchitis worse. If you are a smoker, consider using nicotine gum or skin patches to help control withdrawal symptoms. Quitting smoking will help your lungs heal faster.   Reduce the chances of another bout of acute bronchitis by washing your hands frequently, avoiding people with cold symptoms, and trying not to touch your hands to your mouth, nose, or eyes.   Keep all follow-up visits as directed by your health care provider.  SEEK MEDICAL CARE IF: Your symptoms do not improve after 1 week of treatment.  SEEK IMMEDIATE MEDICAL CARE IF:  You develop an increased fever or chills.   You have chest pain.   You have severe shortness of breath.  You have bloody sputum.   You develop dehydration.  You faint or repeatedly feel like you are going to pass out.  You develop repeated vomiting.  You develop a severe headache. MAKE SURE YOU:   Understand these instructions.  Will watch your condition.  Will get help right away if you are not doing well or get worse. Document Released: 06/21/2004 Document Revised: 09/28/2013 Document Reviewed: 11/04/2012 [facility] Patient Information 2015 [place], [place]. This information is not intended to replace advice given to you by your  health care provider. Make sure you discuss any questions you have with your health care provider. ° °

## 2014-01-14 NOTE — ED Notes (Signed)
The pt just called out reporting that she is now having chest pain.  Dr Eulis Foster notified .

## 2014-01-14 NOTE — ED Notes (Signed)
Per pt sts she was here this am and still feeling bad. sts now she is having migraines and leg pain. sts she thinks her blood sugar is up.

## 2014-01-14 NOTE — ED Notes (Signed)
Pt states that she is normally on 1-2 units of o2 via [place] at home. PT placed on 2LPM [address] per Nurse.

## 2014-01-14 NOTE — ED Notes (Signed)
AS MD was starting IV pt had a run of Vtach  Pt was given 1/2 amp calcium chloride ivp with MD at bedside

## 2014-01-14 NOTE — ED Notes (Signed)
hhn given 

## 2014-01-14 NOTE — ED Notes (Signed)
The pt is   Complaining that she is being sent home and she knows that she will be right back in here.  She was given tylenol for pain because the edp reports she is allergic to so many meds.  She was on the phone to family telling them that she  Is still hurting and  Needs to stay

## 2014-01-14 NOTE — ED Notes (Signed)
hhn complete.  No distress

## 2014-01-14 NOTE — ED Notes (Signed)
Pt c/o lower back pain

## 2014-01-14 NOTE — ED Notes (Signed)
Please have IV team insert intravenous line.

## 2014-01-14 NOTE — ED Notes (Signed)
The pt was diagnosed with pneumonia one month ago.  C/o sob earlier tonight with wheezing .  No wheezing at present.  She has had a productive cough since she has had pneumonia. Non-smoker.  Home 02

## 2014-01-15 DIAGNOSIS — E1029 Type 1 diabetes mellitus with other diabetic kidney complication: Secondary | ICD-10-CM

## 2014-01-15 DIAGNOSIS — N058 Unspecified nephritic syndrome with other morphologic changes: Secondary | ICD-10-CM

## 2014-01-15 LAB — BLOOD GAS, VENOUS
ACID-BASE EXCESS: 0.1 mmol/L (ref 0.0–2.0)
BICARBONATE: 24.9 meq/L — AB (ref 20.0–24.0)
O2 Saturation: 92.8 %
PO2 VEN: 69.7 mmHg — AB (ref 30.0–45.0)
Patient temperature: 98.6
TCO2: 26.3 mmol/L (ref 0–100)
pCO2, Ven: 45.7 mmHg (ref 45.0–50.0)
pH, Ven: 7.355 — ABNORMAL HIGH (ref 7.250–7.300)

## 2014-01-15 LAB — BASIC METABOLIC PANEL WITH GFR
Anion gap: 22 — ABNORMAL HIGH (ref 5–15)
BUN: 39 mg/dL — ABNORMAL HIGH (ref 6–23)
CO2: 19 meq/L (ref 19–32)
Calcium: 8.7 mg/dL (ref 8.4–10.5)
Chloride: 89 meq/L — ABNORMAL LOW (ref 96–112)
Creatinine, Ser: 7.89 mg/dL — ABNORMAL HIGH (ref 0.50–1.10)
GFR calc Af Amer: 7 mL/min — ABNORMAL LOW
GFR calc non Af Amer: 6 mL/min — ABNORMAL LOW
Glucose, Bld: 625 mg/dL (ref 70–99)
Potassium: 5 meq/L (ref 3.7–5.3)
Sodium: 130 meq/L — ABNORMAL LOW (ref 137–147)

## 2014-01-15 LAB — BASIC METABOLIC PANEL
Anion gap: 18 — ABNORMAL HIGH (ref 5–15)
BUN: 22 mg/dL (ref 6–23)
CO2: 24 meq/L (ref 19–32)
CREATININE: 5.66 mg/dL — AB (ref 0.50–1.10)
Calcium: 8.8 mg/dL (ref 8.4–10.5)
Chloride: 94 mEq/L — ABNORMAL LOW (ref 96–112)
GFR calc Af Amer: 10 mL/min — ABNORMAL LOW (ref >90)
GFR calc non Af Amer: 8 mL/min — ABNORMAL LOW (ref >90)
GLUCOSE: 268 mg/dL — AB (ref 70–99)
Potassium: 4 mEq/L (ref 3.7–5.3)
Sodium: 136 mEq/L — ABNORMAL LOW (ref 137–147)

## 2014-01-15 LAB — RENAL FUNCTION PANEL
Albumin: 3.1 g/dL — ABNORMAL LOW (ref 3.5–5.2)
Anion gap: 19 — ABNORMAL HIGH (ref 5–15)
BUN: 26 mg/dL — ABNORMAL HIGH (ref 6–23)
CHLORIDE: 94 meq/L — AB (ref 96–112)
CO2: 22 mEq/L (ref 19–32)
CREATININE: 6.43 mg/dL — AB (ref 0.50–1.10)
Calcium: 8.6 mg/dL (ref 8.4–10.5)
GFR, EST AFRICAN AMERICAN: 8 mL/min — AB (ref >90)
GFR, EST NON AFRICAN AMERICAN: 7 mL/min — AB (ref >90)
Glucose, Bld: 429 mg/dL — ABNORMAL HIGH (ref 70–99)
Phosphorus: 6 mg/dL — ABNORMAL HIGH (ref 2.3–4.6)
Potassium: 4.4 mEq/L (ref 3.7–5.3)
SODIUM: 135 meq/L — AB (ref 137–147)

## 2014-01-15 LAB — MRSA PCR SCREENING: MRSA by PCR: NEGATIVE

## 2014-01-15 LAB — PROCALCITONIN
PROCALCITONIN: 7.7 ng/mL
PROCALCITONIN: 11.16 ng/mL

## 2014-01-15 LAB — MAGNESIUM: Magnesium: 2 mg/dL (ref 1.5–2.5)

## 2014-01-15 LAB — CBC
HCT: 34 % — ABNORMAL LOW (ref 36.0–46.0)
Hemoglobin: 11.2 g/dL — ABNORMAL LOW (ref 12.0–15.0)
MCH: 32.6 pg (ref 26.0–34.0)
MCHC: 32.9 g/dL (ref 30.0–36.0)
MCV: 98.8 fL (ref 78.0–100.0)
PLATELETS: 115 10*3/uL — AB (ref 150–400)
RBC: 3.44 MIL/uL — ABNORMAL LOW (ref 3.87–5.11)
RDW: 15.6 % — AB (ref 11.5–15.5)
WBC: 4.1 10*3/uL (ref 4.0–10.5)

## 2014-01-15 LAB — GLUCOSE, CAPILLARY
GLUCOSE-CAPILLARY: 429 mg/dL — AB (ref 70–99)
Glucose-Capillary: 434 mg/dL — ABNORMAL HIGH (ref 70–99)
Glucose-Capillary: 114 mg/dL — ABNORMAL HIGH (ref 70–99)
Glucose-Capillary: 474 mg/dL — ABNORMAL HIGH (ref 70–99)
Glucose-Capillary: >600 mg/dL (ref 70–99)
Glucose-Capillary: 412 mg/dL — ABNORMAL HIGH (ref 70–99)
Glucose-Capillary: 297 mg/dL — ABNORMAL HIGH (ref 70–99)

## 2014-01-15 LAB — PHOSPHORUS: Phosphorus: 4.8 mg/dL — ABNORMAL HIGH (ref 2.3–4.6)

## 2014-01-15 MED ORDER — INSULIN ASPART 100 UNIT/ML ~~LOC~~ SOLN
3.0000 [IU] | Freq: Three times a day (TID) | SUBCUTANEOUS | Status: DC
  Administered 2014-01-15 – 2014-01-18 (×4): 3 [IU] via SUBCUTANEOUS

## 2014-01-15 MED ORDER — INSULIN ASPART 100 UNIT/ML ~~LOC~~ SOLN
0.0000 [IU] | SUBCUTANEOUS | Status: DC
  Administered 2014-01-15 (×2): 9 [IU] via SUBCUTANEOUS
  Administered 2014-01-16: 3 [IU] via SUBCUTANEOUS
  Administered 2014-01-16: 1 [IU] via SUBCUTANEOUS
  Administered 2014-01-16: 3 [IU] via SUBCUTANEOUS
  Administered 2014-01-17: 1 [IU] via SUBCUTANEOUS

## 2014-01-15 MED ORDER — INSULIN GLARGINE 100 UNIT/ML ~~LOC~~ SOLN
7.0000 [IU] | Freq: Two times a day (BID) | SUBCUTANEOUS | Status: DC
  Administered 2014-01-15 – 2014-01-16 (×3): 7 [IU] via SUBCUTANEOUS
  Filled 2014-01-15 (×5): qty 0.07

## 2014-01-15 MED ORDER — DEXTROSE 50 % IV SOLN
25.0000 mL | INTRAVENOUS | Status: DC | PRN
  Administered 2014-01-16: 13 mL via INTRAVENOUS
  Administered 2014-01-18 – 2014-01-20 (×2): 25 mL via INTRAVENOUS
  Administered 2014-01-21: 50 mL via INTRAVENOUS
  Filled 2014-01-15 (×3): qty 50

## 2014-01-15 MED ORDER — SODIUM CHLORIDE 0.45 % IV SOLN
INTRAVENOUS | Status: DC
  Administered 2014-01-15: 1000 mL via INTRAVENOUS

## 2014-01-15 MED ORDER — SODIUM CHLORIDE 0.9 % IV SOLN
INTRAVENOUS | Status: DC
  Administered 2014-01-15: 3.5 [IU]/h via INTRAVENOUS
  Filled 2014-01-15: qty 2.5

## 2014-01-15 MED ORDER — DEXTROSE-NACL 5-0.45 % IV SOLN
INTRAVENOUS | Status: DC
  Administered 2014-01-15: 1000 mL via INTRAVENOUS

## 2014-01-15 MED ORDER — INSULIN REGULAR BOLUS VIA INFUSION
0.0000 [IU] | Freq: Three times a day (TID) | INTRAVENOUS | Status: DC
  Filled 2014-01-15: qty 10

## 2014-01-15 MED ORDER — INSULIN GLARGINE 100 UNIT/ML ~~LOC~~ SOLN
3.0000 [IU] | Freq: Two times a day (BID) | SUBCUTANEOUS | Status: DC
  Administered 2014-01-15: 3 [IU] via SUBCUTANEOUS
  Filled 2014-01-15 (×2): qty 0.03

## 2014-01-15 MED FILL — Medication: Qty: 1 | Status: AC

## 2014-01-15 NOTE — Progress Notes (Signed)
Pt also reported a headache and some chest pain to 2 heart nurse where she did a EKG and stated it was normal. Gave pt pain medication for pain rating a 9-10/10 on pain scale when pt arrived to floor.

## 2014-01-15 NOTE — Progress Notes (Signed)
eLink Physician-Brief Progress Note Patient Name: [name] DOB: 1970/08/06 MRN: [id number]   Date of Service  01/15/2014  HPI/Events of Note  Brittle diabetic Sugars nml this am, >400 now AG +, but not clear if this is due to renal failure  eICU Interventions  Increase lantus 7 bid (home dose) Add meal coverage Keep on sensitive scale     Intervention Category Intermediate Interventions: Hyperglycemia - evaluation and treatment  Mickala Laton V. 01/15/2014, 4:34 PM

## 2014-01-15 NOTE — Progress Notes (Signed)
Admit: 01/14/2014 LOS: 1  35M ESRD [facility] [place] admit with severe hyperglycemia, hyperkalemia  Subjective:  HD yesterday evening,  2L UF, post K 4.0 Glucose stable, not on insulin gtt Pt w/o new issues, sleepy; has headache HCO3 22 this AM  08/20 0701 - 08/21 0700 In: 3  Out: 1970   Filed Weights   01/14/14 2230 01/15/14 0000 01/15/14 0500  Weight: 69.9 kg (154 lb 1.6 oz) 69.9 kg (154 lb 1.6 oz) 69.9 kg (154 lb 1.6 oz)    Scheduled Meds: . aspirin EC  81 mg Oral Daily  . calcium acetate  2,001 mg Oral TID WC  . calcium chloride  0.5 g Intravenous Once  . calcium gluconate 1 GM IV  1 g Intravenous Once  . clonazePAM  1 mg Oral QHS  . [START ON 01/19/2014] darbepoetin (ARANESP) injection - DIALYSIS  100 mcg Intravenous Q Tue-HD  . [START ON 01/16/2014] doxercalciferol  4 mcg Intravenous Q T,Th,Sa-HD  . doxercalciferol  4 mcg Intravenous Q T,Th,Sa-HD  . enoxaparin (LOVENOX) injection  30 mg Subcutaneous Daily  . gabapentin  100 mg Oral Daily  . insulin aspart  0-9 Units Subcutaneous 6 times per day  . insulin glargine  3 Units Subcutaneous BID  . metoCLOPramide  5 mg Oral TID AC & HS  . metoprolol succinate  100 mg Oral QHS  . multivitamin  1 tablet Oral QHS  . pantoprazole  40 mg Oral QHS  . predniSONE  20 mg Oral BID WC  . sodium bicarbonate  50 mEq Intravenous Once   Continuous Infusions: . sodium chloride    . dextrose 5 % and 0.45% NaCl    . insulin (NOVOLIN-R) infusion     PRN Meds:.sodium chloride, albuterol, dextrose, ondansetron, oxyCODONE  Current Labs: reviewed  Outpt HD Orders TTS [place]  3 hours 45 minutes EDW 67.5 kg  2K2.5Ca  Right thigh AVG  15 gu needles  Hectorol 4 mcg TIW  Venofer 50 mg QThurs  Aranesp 100 QWeek(Tues)  No heparin d/t issues with prolonged bleeding  CBG QTMT and if BS >500 pt gives self 3 U novolog  Physical Exam:  Blood pressure 92/39, pulse 75, temperature 98.9 F (37.2 C), temperature source Oral, resp. rate  12, height 5\' 1"  (1.549 m), weight 69.9 kg (154 lb 1.6 oz), last menstrual period 12/26/2013, SpO2 98.00%. NAD, lying in bed RRR CTAB, no rales, nl wob S/nt/nd No LEE No rashes/lesions Nonfocal, aaox3  Assessment/Plan 1. Hyperglycemia / Brittle DM: improved and off insulin gtt 2. Hyperkalemia: improved after HD, glucose control 3. Acidosis: HCO3 normalized; some mild inc AG likely reflecting ESRD 4. ESRD [facility] [place] via AVG; keep on schedule 5. Anemia: Hb stable.  On maintenance Fe and qTues Aranesp 100 6. 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  No changes 7. HTN/Vol: BP ok, keep EDW 67.5kg for now  Pearson Grippe MD 01/15/2014, 10:25 AM   Recent Labs Lab 01/14/14 2025 01/15/14 0125 01/15/14 0544  NA 134* 136* 135*  K 3.7 4.0 4.4  CL 92* 94* 94*  CO2 23 24 22   GLUCOSE 285* 268* 429*  BUN 27* 22 26*  CREATININE 5.17* 5.66* 6.43*  CALCIUM 9.1 8.8 8.6  PHOS 3.3 4.8* 6.0*    Recent Labs Lab 01/14/14 0216  01/14/14 1452 01/14/14 1953 01/15/14 0125  WBC 5.6  --  6.0  --  4.1  NEUTROABS  --   --  5.4  --   --  HGB 11.6*  < > 13.4 12.9 11.2*  HCT 34.6*  < > 41.6 38.0 34.0*  MCV 98.0  --  102.0*  --  98.8  PLT 156  --  169  --  115*  < > = values in this interval not displayed.

## 2014-01-15 NOTE — Progress Notes (Signed)
Pt states that she does not take her TopropXL 100mg  the night before dialysis. Dr. Jimmy Footman notified of pt med regimen and that her present pressure is 170/73. Instructed to hold medication.

## 2014-01-15 NOTE — Progress Notes (Signed)
PULMONARY / CRITICAL CARE MEDICINE   Name: [name] MRN: [id number] DOB: 24-Dec-1970    ADMISSION DATE:  01/14/2014 CONSULTATION DATE:  01/14/2014  REFERRING MD :  EDP  CHIEF COMPLAINT:  "I feel really bad"  INITIAL PRESENTATION: 43 y/o female with ESRD admitted to [facility] on 8/20 for hyperkalemia and DKA requiring emergent dialysis.  At baseline she is a brittle diabetic with multiple recent admissions for glucose management.  In ED had brief run of VTach.  STUDIES:   SIGNIFICANT EVENTS: 8/20 emergent HD  SUBJECTIVE: No events overnight  VITAL SIGNS: Temp:  [97.7 F (36.5 C)-98.9 F (37.2 C)] 98.9 F (37.2 C) (08/21 0717) Pulse Rate:  [75-97] 75 (08/21 1000) Resp:  [11-22] 12 (08/21 1000) BP: (92-184)/(39-92) 92/39 mmHg (08/21 1000) SpO2:  [98 %-100 %] 98 % (08/21 1000) Weight:  [154 lb 1.6 oz (69.9 kg)-157 lb 6.5 oz (71.4 kg)] 154 lb 1.6 oz (69.9 kg) (08/21 0500) HEMODYNAMICS:   VENTILATOR SETTINGS:   INTAKE / OUTPUT:  Intake/Output Summary (Last 24 hours) at 01/15/14 1058 Last data filed at 01/15/14 1000  Gross per 24 hour  Intake    243 ml  Output   1970 ml  Net  -1727 ml    PHYSICAL EXAMINATION: Gen: well appearing, no acute distress HEENT: NCAT, EOMi, OP clear, neck supple without masses PULM: CTA B CV: RRR, systolic murmur noted, no JVD; right femoral graft with good thrill AB: BS+, soft, nontender, no hsm Ext: warm, no edema, no clubbing, no cyanosis Derm: multiple tattoos, no rash or skin breakdown Neuro: A&Ox4, CN II-XII intact, strength 5/5 in all 4 extremities  LABS:  CBC  Recent Labs Lab 01/14/14 0216  01/14/14 1452 01/14/14 1953 01/15/14 0125  WBC 5.6  --  6.0  --  4.1  HGB 11.6*  < > 13.4 12.9 11.2*  HCT 34.6*  < > 41.6 38.0 34.0*  PLT 156  --  169  --  115*  < > = values in this interval not displayed. Coag's No results found for this basename: APTT, INR,  in the last 168 hours BMET  Recent Labs Lab 01/14/14 2025  01/15/14 0125 01/15/14 0544  NA 134* 136* 135*  K 3.7 4.0 4.4  CL 92* 94* 94*  CO2 23 24 22   BUN 27* 22 26*  CREATININE 5.17* 5.66* 6.43*  GLUCOSE 285* 268* 429*   Electrolytes  Recent Labs Lab 01/14/14 2025 01/15/14 0125 01/15/14 0544  CALCIUM 9.1 8.8 8.6  MG  --   --  2.0  PHOS 3.3 4.8* 6.0*   Sepsis Markers  Recent Labs Lab 01/15/14 0125  PROCALCITON 7.70   ABG No results found for this basename: PHART, PCO2ART, PO2ART,  in the last 168 hours Liver Enzymes  Recent Labs Lab 01/14/14 1452 01/14/14 2025 01/15/14 0544  AST 13  --   --   ALT 8  --   --   ALKPHOS 167*  --   --   BILITOT 0.4  --   --   ALBUMIN 3.7 3.7 3.1*   Cardiac Enzymes  Recent Labs Lab 01/14/14 0216  PROBNP 17541.0*   Glucose  Recent Labs Lab 01/14/14 2035 01/14/14 2143 01/14/14 2232 01/14/14 2341 01/15/14 0644 01/15/14 0722  GLUCAP 275* 162* 110* 131* 429* 434*    Imaging Dg Chest 2 View  01/14/2014   CLINICAL DATA:  Chest pain and shortness of Breath.  EXAM: CHEST  2 VIEW  COMPARISON:  11/27/2013.  FINDINGS: The  heart is mildly enlarged but stable. The mediastinal and hilar contours are unchanged. Persistent bronchitic changes but slight overall improved aeration. No pleural effusion. The bony thorax is intact.  IMPRESSION: Stable cardiac enlargement and chronic bronchitic changes.   Electronically Signed   By: Kalman Jewels M.D.   On: 01/14/2014 01:21   ASSESSMENT / PLAN:  RENAL A:   ESRD Hyperkalemia Increased anion gap likely due to DKA Pseduohyponatremia Failed kidney/pancreas transplant P:   See endocrine HD per renal KVO IVF Replace electrolytes as indicated  ENDOCRINE A:  DKA > likely caused by prednisone, poor po intake, intentionally taking lower dose of glargine P:   Consult diabetic educator KVO IVF Careful transitioning to sub cutaneous insuline considering brittle state  PULMONARY OETT N/A A: History of asthma and recent diagnosis of  bronchitis, but currently breathing/oxygenating comfortably with clear lungs P:   Continue home prn albuterol Stop steroids Hold levaquin  CARDIOVASCULAR CVL N/A A: VTach in ED?, cannot find strip Hypertension P:  Continue home metoprolol Monitor  GASTROINTESTINAL A:  Nausea and vomiting in settting of DKA P:   Diabetic diet Continue home PPI  HEMATOLOGIC A:  No acute issues P:  Monitor glucose  INFECTIOUS A:  Viral prodrome (headache, body aches, nausea) P:   Monitor for fever Monitor cbc Hold antibiotics  NEUROLOGIC A:  Headache P:   Prn oxycodone  TODAY'S SUMMARY: Off insulin drip, cardiac status stable now that potassium is normal, OOB to chair, PT eval, transfer to SDU and to [facility] service for 8/22, PCCM will sign off, please call back if needed.  I have personally obtained a history, examined the patient, evaluated laboratory and imaging results, formulated the assessment and plan and placed orders.  Rush Farmer, M.D. [facility] Pulmonary/Critical Care Medicine. Pager: [phone]. After hours pager: [phone].  01/15/2014, 10:58 AM

## 2014-01-15 NOTE — Progress Notes (Signed)
Date of notification:  01/15/2014  Time of notification:  20:10  Critical value read back: BS 625  Nurse who received alert:  Pleas Koch RN  MD notified (1st page):  Dr. Elsworth Soho  Time of first page:  20:10  MD notified (2nd page):  Time of second page:  Responding MD:  Dr. Elsworth Soho  Time MD responded:  20:10

## 2014-01-15 NOTE — Care Management Note (Signed)
    Page 1 of 1   01/15/2014     8:43:26 AM CARE MANAGEMENT NOTE 01/15/2014  Patient:  [name], [name]   Account Number:  [id number]  Date Initiated:  01/15/2014  Documentation initiated by:  Elissa Hefty  Subjective/Objective Assessment:   adm w hyperkalemia     Action/Plan:   lives alone, o2 w ahc   Anticipated DC Date:     Anticipated DC Plan:           Choice offered to / List presented to:             Status of service:   Medicare Important Message given?   (If response is "NO", the following Medicare IM given date fields will be blank) Date Medicare IM given:   Medicare IM given by:   Date Additional Medicare IM given:   Additional Medicare IM given by:    Discharge Disposition:    Per UR Regulation:  Reviewed for med. necessity/level of care/duration of stay  If discussed at [place] of Stay Meetings, dates discussed:    Comments:

## 2014-01-15 NOTE — Progress Notes (Signed)
Pt was brought to the floor at approximately 1830 and setteled in room. Nurse Junie Panning in 2 heart where pt came from stated they took blood glucose at 1730 and it was high at 474 but she had received orders from MD to give Lantus and Novolog. Nurse from 2 heart stated she would give insulin prior to bringing pt. When pt came insulin had not been given and Nurse from 2 heart stated she did not have insulin lantus from pharmacy and then hand delivered to nurse here on 2c. When 2 c nurse tried to take pt's blood glucose level again it read too hi on glucometer. Nurse gave lantus per md orders and put a order in for a stat glucose lab per protocol and MD orders. Will report to night shift nurse. Pt is asymptomatic. 2 heart nurse stated that pt's blood glucose has been fluctuating a great deal from 120 to 400's and nurse stated in report MD was aware.

## 2014-01-15 NOTE — Progress Notes (Signed)
Inpatient Diabetes Program Recommendations  AACE/ADA: New Consensus Statement on Inpatient Glycemic Control (2013)  Target Ranges:  Prepandial:   less than 140 mg/dL      Peak postprandial:   less than 180 mg/dL (1-2 hours)      Critically ill patients:  140 - 180 mg/dL   Patient is well known to the Inpatient Diabetes Program.  Agree with current Lantus order.   Recommend adding Novolog 3 units TID with meals. May need to use a custom scale Novolog as follows: 250-300=1 unit, 301-350=2 units, 351-400=3 units Recommend Novolog correction scale TID instead of Q4. Thank you  Raoul Pitch BSN, RN,CDE Inpatient Diabetes Coordinator [phone] (team pager)

## 2014-01-15 NOTE — Progress Notes (Signed)
eLink Physician-Brief Progress Note Patient Name: [name] DOB: 05/07/1971 MRN: [id number]   Date of Service  01/15/2014  HPI/Events of Note  CBGs higher 625  eICU Interventions  Start glucostabiliser     Intervention Category Intermediate Interventions: Hyperglycemia - evaluation and treatment  Korey Arroyo V. 01/15/2014, 8:05 PM

## 2014-01-15 NOTE — Progress Notes (Signed)
eLink Physician-Brief Progress Note Patient Name: [name] DOB: 1970-11-12 MRN: [id number]   Date of Service  01/15/2014  HPI/Events of Note  Patient on DKA protocol. Has completed HD and went to [facility].   RN says patient has DKA order set but not on insulin gtt.  Last bmet 4h ago (she got 6 U sq insulin then) shows bic 23 but gap still high at 19  Patient has ordes for DKA protocol but not on it. There are no SQ insulin orders either  Patient comfortable and stable currently with current finger sticks  Running 130  eICU Interventions  STAT BMET to determine if she needs to go back on DKA protocol or we can do SQ insulin     Intervention Category Major Interventions: Acid-Base disturbance - evaluation and management  Inza Mikrut 01/15/2014, 12:13 AM

## 2014-01-15 NOTE — Progress Notes (Addendum)
eLink Physician-Brief Progress Note Patient Name: LASHANNA ANGELO DOB: 1970/11/15 MRN: [id number]   Date of Service  01/15/2014  HPI/Events of Note  Sugars ok  PH on VBG 7.355 bic is now 24 Gap still high at 19    eICU Interventions  Given normal pH and bic no need for insulin gtt  Start sq insulin ssi q4h renal  Lantus 3 units bid (half of home dose)        Vanshika Jastrzebski 01/15/2014, 6:21 AM

## 2014-01-16 LAB — BASIC METABOLIC PANEL
Anion gap: 19 — ABNORMAL HIGH (ref 5–15)
BUN: 51 mg/dL — AB (ref 6–23)
CHLORIDE: 93 meq/L — AB (ref 96–112)
CO2: 22 mEq/L (ref 19–32)
CREATININE: 9.36 mg/dL — AB (ref 0.50–1.10)
Calcium: 8.8 mg/dL (ref 8.4–10.5)
GFR calc non Af Amer: 5 mL/min — ABNORMAL LOW (ref >90)
GFR, EST AFRICAN AMERICAN: 5 mL/min — AB (ref >90)
GLUCOSE: 150 mg/dL — AB (ref 70–99)
POTASSIUM: 5.1 meq/L (ref 3.7–5.3)
Sodium: 134 mEq/L — ABNORMAL LOW (ref 137–147)

## 2014-01-16 LAB — MAGNESIUM: Magnesium: 2 mg/dL (ref 1.5–2.5)

## 2014-01-16 LAB — GLUCOSE, CAPILLARY
GLUCOSE-CAPILLARY: 119 mg/dL — AB (ref 70–99)
GLUCOSE-CAPILLARY: 159 mg/dL — AB (ref 70–99)
GLUCOSE-CAPILLARY: 198 mg/dL — AB (ref 70–99)
GLUCOSE-CAPILLARY: 243 mg/dL — AB (ref 70–99)
GLUCOSE-CAPILLARY: 67 mg/dL — AB (ref 70–99)
GLUCOSE-CAPILLARY: 83 mg/dL (ref 70–99)
Glucose-Capillary: 148 mg/dL — ABNORMAL HIGH (ref 70–99)
Glucose-Capillary: 224 mg/dL — ABNORMAL HIGH (ref 70–99)
Glucose-Capillary: 255 mg/dL — ABNORMAL HIGH (ref 70–99)
Glucose-Capillary: 189 mg/dL — ABNORMAL HIGH (ref 70–99)
Glucose-Capillary: 135 mg/dL — ABNORMAL HIGH (ref 70–99)
Glucose-Capillary: 220 mg/dL — ABNORMAL HIGH (ref 70–99)
Glucose-Capillary: 128 mg/dL — ABNORMAL HIGH (ref 70–99)

## 2014-01-16 LAB — PHOSPHORUS: Phosphorus: 5.7 mg/dL — ABNORMAL HIGH (ref 2.3–4.6)

## 2014-01-16 LAB — CBC
HEMATOCRIT: 33.7 % — AB (ref 36.0–46.0)
HEMOGLOBIN: 11.1 g/dL — AB (ref 12.0–15.0)
MCH: 32.4 pg (ref 26.0–34.0)
MCHC: 32.9 g/dL (ref 30.0–36.0)
MCV: 98.3 fL (ref 78.0–100.0)
Platelets: 149 10*3/uL — ABNORMAL LOW (ref 150–400)
RBC: 3.43 MIL/uL — ABNORMAL LOW (ref 3.87–5.11)
RDW: 15.7 % — ABNORMAL HIGH (ref 11.5–15.5)
WBC: 7.3 10*3/uL (ref 4.0–10.5)

## 2014-01-16 MED ORDER — PROMETHAZINE HCL 25 MG PO TABS
12.5000 mg | ORAL_TABLET | Freq: Once | ORAL | Status: AC
  Administered 2014-01-16: 12.5 mg via ORAL

## 2014-01-16 MED ORDER — DOXERCALCIFEROL 4 MCG/2ML IV SOLN
INTRAVENOUS | Status: AC
  Filled 2014-01-16: qty 2

## 2014-01-16 MED ORDER — PROMETHAZINE HCL 25 MG PO TABS
ORAL_TABLET | ORAL | Status: AC
  Filled 2014-01-16: qty 1

## 2014-01-16 MED ORDER — ZOLPIDEM TARTRATE 5 MG PO TABS
5.0000 mg | ORAL_TABLET | Freq: Every evening | ORAL | Status: DC | PRN
  Administered 2014-01-16: 5 mg via ORAL
  Filled 2014-01-16: qty 1

## 2014-01-16 MED ORDER — ALBUTEROL SULFATE (2.5 MG/3ML) 0.083% IN NEBU: 2.5000 mg | INHALATION_SOLUTION | RESPIRATORY_TRACT | Status: DC | PRN

## 2014-01-16 NOTE — Progress Notes (Signed)
[facility] TEAM 1 - Stepdown/ICU TEAM Progress Note  [name] LGX:[id number] DOB: 1971/01/31 DOA: 01/14/2014 PCP: [name], MD  Admit HPI / Brief Narrative: 43 y/o female who has been admitted here multiple times recently for management of various infections (recent HCAP with septic shock requiring mechanical ventilation) and very poor glucose control.  Prior to this admit she had been struggling with headache, fatigue, nausea, vomiting and muscle aches. She came to the [facility] ED around midnight on 8/20 (early AM) complaining of dyspnea. She was discharged home on Levaquin and prednisone 20mg  tablets for what was felt to be bronchitis. At that point her blood sugar and potassium were normal. She took a lower dose than normal of glargine (only 3 units) because she was throwing up and could not eat. She returned to the ED later in the afternoon on 8/20 complaining of "feeling really bad". She was found to have a glucose > 800 and a potassium > 7.7. Apparently she had VTach captured on tele. She was given insulin IV and calcium. The Nephrology service saw her and arranged emergent hemodialysis.  She normally dialyzes TTS. Her last dialysis session was on 8/18. She had a history of non-compliance and a failed kindey and pancreas transplant. She currently denies trouble breathing.  Significant Events: 8/20 emergent HD 8/21 transfer to SDU   HPI/Subjective: Pt is seen on HD.  She has no complalints, and states she would like to go home soon.  She denies cp, n/v, abdom pain, or sob.    Assessment/Plan:  Severe Hyperglycemia - Brittle DM 1 w/ DKA  caused by prednisone, poor po intake, intentionally taking lower dose of glargine - stop prednisone (review of records reveals she is not on chronic prednisone) - scheduled insulin + SSI - follow CBG closely   Hyperkalemia Resolved w/ HD as expected   Increased anion gap metabolic acidosis Due to combination or ESRD + DKA - DKA appears  to have resolved - HD will address renal component   ESRD on HD s/p Failed kidney/pancreas transplant not on chronic immunosuppression as per review of records (transitioned off early 2015) - Nephrology following for HD   Gastroparesis  Continue usual reglan   Chronic anemia  Due to CKD - Hgb stable  HTN BP currently reasonably controlled, though not at goal - will not adjust med tx today as pt due for HD  Obesity - Body mass index is 31.56 kg/(m^2).  Code Status: FULL Family Communication: no family present at time of exam Disposition Plan: SDU   Consultants: Nephrology  Antibiotics: levaquin 8/19  DVT prophylaxis: lovenox  Objective: Blood pressure 141/70, pulse 68, temperature 97.7 F (36.5 C), temperature source Oral, resp. rate 14, height 5' (1.524 m), weight 73.3 kg (161 lb 9.6 oz), last menstrual period 12/26/2013, SpO2 94.00%.  Intake/Output Summary (Last 24 hours) at 01/16/14 0844 Last data filed at 01/16/14 0734  Gross per 24 hour  Intake 1062.65 ml  Output      0 ml  Net 1062.65 ml   Exam: General: No acute respiratory distress Lungs: Clear to auscultation bilaterally without wheezes or crackles Cardiovascular: Regular rate and rhythm without murmur gallop or rub normal S1 and S2 Abdomen: Nontender, nondistended, soft, bowel sounds positive, no rebound, no ascites, no appreciable mass Extremities: No significant cyanosis, clubbing, or edema bilateral lower extremities  Data Reviewed: Basic Metabolic Panel:  Recent Labs Lab 01/14/14 2025 01/15/14 0125 01/15/14 0544 01/15/14 1853 01/16/14 0603  NA 134* 136*  135* 130* 134*  K 3.7 4.0 4.4 5.0 5.1  CL 92* 94* 94* 89* 93*  CO2 23 24 22 19 22   GLUCOSE 285* 268* 429* 625* 150*  BUN 27* 22 26* 39* 51*  CREATININE 5.17* 5.66* 6.43* 7.89* 9.36*  CALCIUM 9.1 8.8 8.6 8.7 8.8  MG  --   --  2.0  --  2.0  PHOS 3.3 4.8* 6.0*  --  5.7*   Liver Function Tests:  Recent Labs Lab 01/14/14 1452  01/14/14 2025 01/15/14 0544  AST 13  --   --   ALT 8  --   --   ALKPHOS 167*  --   --   BILITOT 0.4  --   --   PROT 8.4*  --   --   ALBUMIN 3.7 3.7 3.1*   CBC:  Recent Labs Lab 01/14/14 0216 01/14/14 0223 01/14/14 1452 01/14/14 1953 01/15/14 0125 01/16/14 0603  WBC 5.6  --  6.0  --  4.1 7.3  NEUTROABS  --   --  5.4  --   --   --   HGB 11.6* 13.3 13.4 12.9 11.2* 11.1*  HCT 34.6* 39.0 41.6 38.0 34.0* 33.7*  MCV 98.0  --  102.0*  --  98.8 98.3  PLT 156  --  169  --  115* 149*   CBG:  Recent Labs Lab 01/16/14 0226 01/16/14 0330 01/16/14 0514 01/16/14 0619 01/16/14 0731  GLUCAP 224* 255* 189* 135* 83    Recent Results (from the past 240 hour(s))  MRSA PCR SCREENING     Status: None   Collection Time    01/14/14 11:39 PM      Result Value Ref Range Status   MRSA by PCR NEGATIVE  NEGATIVE Final   Comment:            The GeneXpert MRSA Assay (FDA     approved for NASAL specimens     only), is one component of a     comprehensive MRSA colonization     surveillance program. It is not     intended to diagnose MRSA     infection nor to guide or     monitor treatment for     MRSA infections.    Studies:  Recent x-ray studies have been reviewed in detail by the Attending Physician  Scheduled Meds:  Scheduled Meds: . aspirin EC  81 mg Oral Daily  . calcium acetate  2,001 mg Oral TID WC  . calcium chloride  0.5 g Intravenous Once  . calcium gluconate 1 GM IV  1 g Intravenous Once  . clonazePAM  1 mg Oral QHS  . [START ON 01/19/2014] darbepoetin (ARANESP) injection - DIALYSIS  100 mcg Intravenous Q Tue-HD  . doxercalciferol  4 mcg Intravenous Q T,Th,Sa-HD  . doxercalciferol  4 mcg Intravenous Q T,Th,Sa-HD  . enoxaparin (LOVENOX) injection  30 mg Subcutaneous Daily  . gabapentin  100 mg Oral Daily  . insulin aspart  0-9 Units Subcutaneous 6 times per day  . insulin aspart  3 Units Subcutaneous TID WC  . insulin glargine  7 Units Subcutaneous BID  . insulin  regular  0-10 Units Intravenous TID WC  . metoCLOPramide  5 mg Oral TID AC & HS  . metoprolol succinate  100 mg Oral QHS  . multivitamin  1 tablet Oral QHS  . pantoprazole  40 mg Oral QHS  . predniSONE  20 mg Oral BID WC  . sodium bicarbonate  50  mEq Intravenous Once    Time spent on care of this patient: 35 mins   Raynetta Osterloh T , MD   [facility] Office  [phone] Pager - Text Page per Shea Evans as per below:  On-Call/Text Page:      [email or web address]      password [email or web address]  If 7PM-7AM, please contact night-coverage [email or web address] Password [facility] 01/16/2014, 8:44 AM   LOS: 2 days

## 2014-01-16 NOTE — Progress Notes (Signed)
Admit: 01/14/2014 LOS: 2  48M ESRD [facility] [name] admit with severe hyperglycemia, hyperkalemia  Subjective:  For HD today Hyperglycemia, back on insulin gtt this AM No co  08/21 0701 - 08/22 0700 In: 1060.8 [P.O.:870; I.V.:190.8] Out: -   Filed Weights   01/15/14 0500 01/15/14 1804 01/16/14 0333  Weight: 69.9 kg (154 lb 1.6 oz) 68.947 kg (152 lb) 73.3 kg (161 lb 9.6 oz)    Scheduled Meds: . aspirin EC  81 mg Oral Daily  . calcium acetate  2,001 mg Oral TID WC  . calcium chloride  0.5 g Intravenous Once  . calcium gluconate 1 GM IV  1 g Intravenous Once  . clonazePAM  1 mg Oral QHS  . [START ON 01/19/2014] darbepoetin (ARANESP) injection - DIALYSIS  100 mcg Intravenous Q Tue-HD  . doxercalciferol  4 mcg Intravenous Q T,Th,Sa-HD  . doxercalciferol  4 mcg Intravenous Q T,Th,Sa-HD  . enoxaparin (LOVENOX) injection  30 mg Subcutaneous Daily  . gabapentin  100 mg Oral Daily  . insulin aspart  0-9 Units Subcutaneous 6 times per day  . insulin aspart  3 Units Subcutaneous TID WC  . insulin glargine  7 Units Subcutaneous BID  . insulin regular  0-10 Units Intravenous TID WC  . metoCLOPramide  5 mg Oral TID AC & HS  . metoprolol succinate  100 mg Oral QHS  . multivitamin  1 tablet Oral QHS  . pantoprazole  40 mg Oral QHS  . predniSONE  20 mg Oral BID WC  . sodium bicarbonate  50 mEq Intravenous Once   Continuous Infusions: . sodium chloride 10 mL/hr at 01/15/14 2220  . dextrose 5 % and 0.45% NaCl 50 mL/hr at 01/16/14 0200  . insulin (NOVOLIN-R) infusion 0.2 Units/hr (01/16/14 0734)   PRN Meds:.sodium chloride, albuterol, dextrose, ondansetron, oxyCODONE  Current Labs: reviewed  Outpt HD Orders TTS [facility]  3 hours 45 minutes EDW 67.5 kg  2K2.5Ca  Right thigh AVG  15 gu needles  Hectorol 4 mcg TIW  Venofer 50 mg QThurs  Aranesp 100 QWeek(Tues)  No heparin d/t issues with prolonged bleeding  CBG QTMT and if BS >500 pt gives self 3 U novolog  Physical Exam:   Blood pressure 158/72, pulse 75, temperature 97.5 F (36.4 C), temperature source Axillary, resp. rate 11, height 5' (1.524 m), weight 73.3 kg (161 lb 9.6 oz), last menstrual period 12/26/2013, SpO2 100.00%. NAD, lying in bed RRR CTAB, no rales, nl wob S/nt/nd No LEE No rashes/lesions Nonfocal, aaox3  Assessment/Plan 1. Hyperglycemia / Brittle DM: improved, remains labile; req restarting ins gtt overnight 2. Hyperkalemia: resolved. Tx'd with insulin and HD 3. Acidosis: HCO3 normalized; some mild inc AG likely reflecting ESRD 4. ESRD [facility] [place] via AVG; keep on schedule 5. Anemia: Hb stable.  On maintenance Fe and qTues Aranesp 100. 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  No changes 7. HTN/Vol: BP ok, keep EDW 67.5kg for now  Pearson Grippe MD 01/16/2014, 8:09 AM   Recent Labs Lab 01/15/14 0125 01/15/14 0544 01/15/14 1853 01/16/14 0603  NA 136* 135* 130* 134*  K 4.0 4.4 5.0 5.1  CL 94* 94* 89* 93*  CO2 24 22 19 22   GLUCOSE 268* 429* 625* 150*  BUN 22 26* 39* 51*  CREATININE 5.66* 6.43* 7.89* 9.36*  CALCIUM 8.8 8.6 8.7 8.8  PHOS 4.8* 6.0*  --  5.7*    Recent Labs Lab 01/14/14 1452 01/14/14 1953 01/15/14 0125 01/16/14  0603  WBC 6.0  --  4.1 7.3  NEUTROABS 5.4  --   --   --   HGB 13.4 12.9 11.2* 11.1*  HCT 41.6 38.0 34.0* 33.7*  MCV 102.0*  --  98.8 98.3  PLT 169  --  115* 149*

## 2014-01-17 LAB — GLUCOSE, CAPILLARY
GLUCOSE-CAPILLARY: 147 mg/dL — AB (ref 70–99)
Glucose-Capillary: 94 mg/dL (ref 70–99)
Glucose-Capillary: 119 mg/dL — ABNORMAL HIGH (ref 70–99)
Glucose-Capillary: 42 mg/dL — CL (ref 70–99)
Glucose-Capillary: 96 mg/dL (ref 70–99)
Glucose-Capillary: 136 mg/dL — ABNORMAL HIGH (ref 70–99)

## 2014-01-17 MED ORDER — ACETAMINOPHEN 325 MG PO TABS
650.0000 mg | ORAL_TABLET | Freq: Four times a day (QID) | ORAL | Status: DC | PRN
  Administered 2014-01-17: 650 mg via ORAL
  Filled 2014-01-17: qty 2

## 2014-01-17 MED ORDER — PROMETHAZINE HCL 25 MG/ML IJ SOLN
6.2500 mg | Freq: Four times a day (QID) | INTRAMUSCULAR | Status: DC | PRN
  Administered 2014-01-17: 12.5 mg via INTRAVENOUS
  Administered 2014-01-17: 6.25 mg via INTRAVENOUS
  Administered 2014-01-18: 12.5 mg via INTRAVENOUS
  Filled 2014-01-17 (×3): qty 1

## 2014-01-17 MED ORDER — INSULIN GLARGINE 100 UNIT/ML ~~LOC~~ SOLN
6.0000 [IU] | Freq: Two times a day (BID) | SUBCUTANEOUS | Status: DC
  Administered 2014-01-17 – 2014-01-18 (×2): 6 [IU] via SUBCUTANEOUS
  Filled 2014-01-17 (×3): qty 0.06

## 2014-01-17 MED ORDER — INSULIN ASPART 100 UNIT/ML ~~LOC~~ SOLN
0.0000 [IU] | Freq: Three times a day (TID) | SUBCUTANEOUS | Status: DC
  Administered 2014-01-18: 3 [IU] via SUBCUTANEOUS

## 2014-01-17 NOTE — Progress Notes (Signed)
Admit: 01/14/2014 LOS: 3  72M ESRD [facility] [place] admit with severe hyperglycemia, hyperkalemia  Subjective:  HD yesterday, 2.9L UF Off insluin gtt, CBG more stable No c/o, feels well, thinks ready to go home  08/22 0701 - 08/23 0700 In: 561.8 [P.O.:460; I.V.:101.8] Out: 2922   [facility] Weights   01/16/14 1336 01/16/14 1737 01/17/14 0404  Weight: 71.4 kg (157 lb 6.5 oz) 67.5 kg (148 lb 13 oz) 71 kg (156 lb 8.4 oz)    Scheduled Meds: . aspirin EC  81 mg Oral Daily  . calcium acetate  2,001 mg Oral TID WC  . clonazePAM  1 mg Oral QHS  . [START ON 01/19/2014] darbepoetin (ARANESP) injection - DIALYSIS  100 mcg Intravenous Q Tue-HD  . doxercalciferol  4 mcg Intravenous Q T,Th,Sa-HD  . enoxaparin (LOVENOX) injection  30 mg Subcutaneous Daily  . gabapentin  100 mg Oral Daily  . insulin aspart  0-9 Units Subcutaneous 6 times per day  . insulin aspart  3 Units Subcutaneous TID WC  . insulin glargine  7 Units Subcutaneous BID  . metoCLOPramide  5 mg Oral TID AC & HS  . metoprolol succinate  100 mg Oral QHS  . multivitamin  1 tablet Oral QHS  . pantoprazole  40 mg Oral QHS   Continuous Infusions:   PRN Meds:.sodium chloride, albuterol, dextrose, ondansetron, oxyCODONE, zolpidem  Current Labs: reviewed  Outpt HD Orders TTS [facility]  3 hours 45 minutes EDW 67.5 kg  2K2.5Ca  Right thigh AVG  15 gu needles  Hectorol 4 mcg TIW  Venofer 50 mg QThurs  Aranesp 100 QWeek(Tues)  No heparin d/t issues with prolonged bleeding  CBG QTMT and if BS >500 pt gives self 3 U novolog  Physical Exam:  Blood pressure 109/62, pulse 67, temperature 96.1 F (35.6 C), temperature source Axillary, resp. rate 13, height 5' (1.524 m), weight 71 kg (156 lb 8.4 oz), last menstrual period 12/26/2013, SpO2 96.00%. NAD, lying in bed RRR CTAB, no rales, nl wob S/nt/nd No LEE No rashes/lesions Nonfocal, aaox3  Assessment/Plan 1. Hyperglycemia / Brittle DM: improved off insulin gtt; per  TRH 2. Hyperkalemia: resolved.  3. Acidosis: HCO3 normalized; some mild inc AG likely reflecting ESRD 4. ESRD [facility] [place] via AVG; keep on schedule.  No further inpt ESRD needs. 5. Anemia: Hb stable.  On maintenance Fe and qTues Aranesp 100. 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  No changes 7. HTN/Vol: BP ok, keep EDW 67.5kg for now  Pearson Grippe MD 01/17/2014, 6:26 AM   Recent Labs Lab 01/15/14 0125 01/15/14 0544 01/15/14 1853 01/16/14 0603  NA 136* 135* 130* 134*  K 4.0 4.4 5.0 5.1  CL 94* 94* 89* 93*  CO2 24 22 19 22   GLUCOSE 268* 429* 625* 150*  BUN 22 26* 39* 51*  CREATININE 5.66* 6.43* 7.89* 9.36*  CALCIUM 8.8 8.6 8.7 8.8  PHOS 4.8* 6.0*  --  5.7*    Recent Labs Lab 01/14/14 1452 01/14/14 1953 01/15/14 0125 01/16/14 0603  WBC 6.0  --  4.1 7.3  NEUTROABS 5.4  --   --   --   HGB 13.4 12.9 11.2* 11.1*  HCT 41.6 38.0 34.0* 33.7*  MCV 102.0*  --  98.8 98.3  PLT 169  --  115* 149*

## 2014-01-17 NOTE — Progress Notes (Signed)
Pt CBG this am 44, a/o, denied any s/s. Breakfast is here and she is eating. CBG will be rechecked after she eats.

## 2014-01-17 NOTE — Progress Notes (Signed)
[place] TEAM 1 - Stepdown/ICU TEAM Progress Note  [name]:[id number] DOB: 07-28-1970 DOA: 01/14/2014 PCP: [name], MD  Admit HPI / Brief Narrative: 43 y/o female who has been admitted here multiple times recently for management of various infections (recent HCAP with septic shock requiring mechanical ventilation) and very poor glucose control.  Prior to this admit she had been struggling with headache, fatigue, nausea, vomiting and muscle aches. She came to the [facility] ED around midnight on 8/20 (early AM) complaining of dyspnea. She was discharged home on Levaquin and prednisone 20mg  tablets for what was felt to be bronchitis. At that point her blood sugar and potassium were normal. She took a lower dose than normal of glargine (only 3 units) because she was throwing up and could not eat. She returned to the ED later in the afternoon on 8/20 complaining of "feeling really bad". She was found to have a glucose > 800 and a potassium > 7.7. Apparently she had VTach captured on tele. She was given insulin IV and calcium. The Nephrology service saw her and arranged emergent hemodialysis.  She normally dialyzes TTS. Her last dialysis session was on 8/18. She had a history of non-compliance and a failed kindey and pancreas transplant. She currently denies trouble breathing.  Significant Events: 8/20 emergent HD 8/21 transfer to SDU   HPI/Subjective: Pt suffered an episode of hypoglycemia this morning (CBG 40).  She quickly recovered with oral intake, and did not exhibit s/sx of severe hypoglycemia.  At the time of my visit, however, she is c/o severe nausea.  She denies cp, f/c, sob, or cp.  Denies diarrhea or vomiting thus far.   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  Code Status: FULL Family Communication: no family present at time of exam Disposition Plan: SDU - possible d/c home in AM   Consultants: Nephrology  Antibiotics: levaquin 8/19  DVT prophylaxis: lovenox  Objective: Blood pressure 119/64, pulse 66, temperature 98.3 F (36.8 C), temperature source Oral, resp. rate 12, height 5' (1.524 m), weight 71 kg (156 lb 8.4 oz), last menstrual period 12/26/2013, SpO2 93.00%.  Intake/Output Summary (Last 24 hours) at 01/17/14 1115 Last data filed at 01/16/14 1737  Gross per 24 hour  Intake    360 ml  Output   2922 ml  Net  -2562 ml   Exam: General: No acute respiratory distress Lungs: Clear to auscultation bilaterally without wheezes or crackles Cardiovascular: Regular rate and rhythm without murmur gallop or rub  Abdomen: Nontender, nondistended, soft, bowel sounds positive, no rebound, no ascites, no appreciable mass Extremities: No  significant cyanosis, clubbing, or edema bilateral lower extremities  Data Reviewed: Basic Metabolic Panel:  Recent Labs Lab 01/14/14 2025 01/15/14 0125 01/15/14 0544 01/15/14 1853 01/16/14 0603  NA 134* 136* 135* 130* 134*  K 3.7 4.0 4.4 5.0 5.1  CL 92* 94* 94* 89* 93*  CO2 23 24 22 19 22   GLUCOSE 285* 268* 429* 625* 150*  BUN 27* 22 26* 39* 51*  CREATININE 5.17* 5.66*  6.43* 7.89* 9.36*  CALCIUM 9.1 8.8 8.6 8.7 8.8  MG  --   --  2.0  --  2.0  PHOS 3.3 4.8* 6.0*  --  5.7*   Liver Function Tests:  Recent Labs Lab 01/14/14 1452 01/14/14 2025 01/15/14 0544  AST 13  --   --   ALT 8  --   --   ALKPHOS 167*  --   --   BILITOT 0.4  --   --   PROT 8.4*  --   --   ALBUMIN 3.7 3.7 3.1*   CBC:  Recent Labs Lab 01/14/14 0216 01/14/14 0223 01/14/14 1452 01/14/14 1953 01/15/14 0125 01/16/14 0603  WBC 5.6  --  6.0  --  4.1 7.3  NEUTROABS  --   --  5.4  --   --   --   HGB 11.6* 13.3 13.4 12.9 11.2* 11.1*  HCT 34.6* 39.0 41.6 38.0 34.0* 33.7*  MCV 98.0  --  102.0*  --  98.8 98.3  PLT 156  --  169  --  115* 149*   CBG:  Recent Labs Lab 01/16/14 2022 01/16/14 2321 01/17/14 0403 01/17/14 0833 01/17/14 0921  GLUCAP 128* 94 147* 42* 119*    Recent Results (from the past 240 hour(s))  MRSA PCR SCREENING     Status: None   Collection Time    01/14/14 11:39 PM      Result Value Ref Range Status   MRSA by PCR NEGATIVE  NEGATIVE Final   Comment:            The GeneXpert MRSA Assay (FDA     approved for NASAL specimens     only), is one component of a     comprehensive MRSA colonization     surveillance program. It is not     intended to diagnose MRSA     infection nor to guide or     monitor treatment for     MRSA infections.    Studies:  Recent x-ray studies have been reviewed in detail by the Attending Physician  Scheduled Meds:  Scheduled Meds: . aspirin EC  81 mg Oral Daily  . calcium acetate  2,001 mg Oral TID WC  . clonazePAM  1 mg Oral QHS  . [START ON 01/19/2014] darbepoetin (ARANESP) injection - DIALYSIS  100 mcg Intravenous Q Tue-HD  . doxercalciferol  4 mcg Intravenous Q T,Th,Sa-HD  . enoxaparin (LOVENOX) injection  30 mg Subcutaneous Daily  . gabapentin  100 mg Oral Daily  . insulin aspart  0-9 Units Subcutaneous 6 times per day  . insulin aspart  3 Units Subcutaneous TID WC  . insulin glargine  7 Units Subcutaneous  BID  . metoCLOPramide  5 mg Oral TID AC & HS  . metoprolol succinate  100 mg Oral QHS  . multivitamin  1 tablet Oral QHS  . pantoprazole  40 mg Oral QHS    Time spent on care of this patient: 35 mins   Tomeko Scoville T , MD   [facility] Office  [phone] Pager - Text Page per Shea Evans as per below:  On-Call/Text Page:      [email or web address]      password [email or web address]  If 7PM-7AM, please contact night-coverage [email or web address] Password [email or web address] 01/17/2014, 11:15 AM   LOS: 3 days

## 2014-01-18 LAB — GLUCOSE, CAPILLARY
GLUCOSE-CAPILLARY: 206 mg/dL — AB (ref 70–99)
GLUCOSE-CAPILLARY: 74 mg/dL (ref 70–99)
GLUCOSE-CAPILLARY: 148 mg/dL — AB (ref 70–99)
GLUCOSE-CAPILLARY: 176 mg/dL — AB (ref 70–99)
Glucose-Capillary: 55 mg/dL — ABNORMAL LOW (ref 70–99)
Glucose-Capillary: 40 mg/dL — CL (ref 70–99)
Glucose-Capillary: 53 mg/dL — ABNORMAL LOW (ref 70–99)
Glucose-Capillary: 111 mg/dL — ABNORMAL HIGH (ref 70–99)
Glucose-Capillary: 212 mg/dL — ABNORMAL HIGH (ref 70–99)

## 2014-01-18 LAB — BASIC METABOLIC PANEL
Anion gap: 16 — ABNORMAL HIGH (ref 5–15)
BUN: 44 mg/dL — ABNORMAL HIGH (ref 6–23)
CALCIUM: 8.9 mg/dL (ref 8.4–10.5)
CO2: 25 meq/L (ref 19–32)
Chloride: 94 mEq/L — ABNORMAL LOW (ref 96–112)
Creatinine, Ser: 8.08 mg/dL — ABNORMAL HIGH (ref 0.50–1.10)
GFR calc Af Amer: 6 mL/min — ABNORMAL LOW (ref >90)
GFR calc non Af Amer: 6 mL/min — ABNORMAL LOW (ref >90)
Glucose, Bld: 148 mg/dL — ABNORMAL HIGH (ref 70–99)
Potassium: 4.9 mEq/L (ref 3.7–5.3)
Sodium: 135 mEq/L — ABNORMAL LOW (ref 137–147)

## 2014-01-18 MED ORDER — INSULIN ASPART 100 UNIT/ML ~~LOC~~ SOLN
2.0000 [IU] | Freq: Three times a day (TID) | SUBCUTANEOUS | Status: DC
  Administered 2014-01-18 – 2014-01-20 (×2): 2 [IU] via SUBCUTANEOUS

## 2014-01-18 MED ORDER — INSULIN GLARGINE 100 UNIT/ML ~~LOC~~ SOLN
4.0000 [IU] | Freq: Two times a day (BID) | SUBCUTANEOUS | Status: DC
  Administered 2014-01-18 – 2014-01-20 (×4): 4 [IU] via SUBCUTANEOUS
  Filled 2014-01-18 (×5): qty 0.04

## 2014-01-18 MED ORDER — PROMETHAZINE HCL 25 MG/ML IJ SOLN
12.5000 mg | Freq: Four times a day (QID) | INTRAMUSCULAR | Status: DC | PRN
  Administered 2014-01-18 – 2014-01-20 (×5): 12.5 mg via INTRAVENOUS
  Administered 2014-01-21: 25 mg via INTRAVENOUS
  Administered 2014-01-21: 12.5 mg via INTRAVENOUS
  Administered 2014-01-21 – 2014-01-22 (×2): 25 mg via INTRAVENOUS
  Filled 2014-01-18 (×8): qty 1

## 2014-01-18 MED ORDER — DEXTROSE 50 % IV SOLN
INTRAVENOUS | Status: AC
  Filled 2014-01-18: qty 50

## 2014-01-18 NOTE — Evaluation (Signed)
Physical Therapy Evaluation Patient Details Name: [name] MRN: [id number] DOB: 07-19-1970 Today's Date: 01/18/2014   History of Present Illness  43 years old female adm 01/14/14 with  weakness and dizziness. Found to be in severe DKA.  PMH relevant for DM type 1, ESRD post kidney and pancreas transplant but now back on HD, HTN,   Clinical Impression  Patient evaluated by Physical Therapy with no further acute PT needs identified. She tolerated 250 ft of ambulation on room air with SaO2 99% and no signs of imbalance. All education has been completed and the patient has no further questions. PT is signing off. Thank you for this referral.     Follow Up Recommendations No PT follow up    Equipment Recommendations  None recommended by PT    Recommendations for Other Services       Precautions / Restrictions        Mobility  Bed Mobility Overal bed mobility: Independent                Transfers Overall transfer level: Independent Equipment used: None                Ambulation/Gait Ambulation/Gait assistance: Supervision Ambulation Distance (Feet): 250 Feet Assistive device: None Gait Pattern/deviations: WFL(Within Functional Limits) Gait velocity: decr due to slightly nauseous      Stairs            Wheelchair Mobility    Modified Rankin (Stroke Patients Only)       Balance Overall balance assessment: Independent (rhomberg eyes closed 30 sec; 360 turn 7 steps)                                           Pertinent Vitals/Pain Pain Assessment: No/denies pain    Home Living Family/patient expects to be discharged to:: Private residence Living Arrangements: Children Available Help at Discharge: Family;Available PRN/intermittently Type of Home: Other(Comment) (Townhouse) Home Access: Level entry     Home Layout: Two level;Bed/bath upstairs Home Equipment: None      Prior Function Level of Independence: Independent                Hand Dominance        Extremity/Trunk Assessment   Upper Extremity Assessment: Overall WFL for tasks assessed           Lower Extremity Assessment: Overall WFL for tasks assessed      Cervical / Trunk Assessment: Normal  Communication   Communication: No difficulties  Cognition Arousal/Alertness: Awake/alert Behavior During Therapy: WFL for tasks assessed/performed Overall Cognitive Status: Within Functional Limits for tasks assessed                      General Comments General comments (skin integrity, edema, etc.): Pt denies h/o balance problems.    Exercises        Assessment/Plan    PT Assessment Patent does not need any further PT services  PT Diagnosis     PT Problem List    PT Treatment Interventions     PT Goals (Current goals can be found in the Care Plan section) Acute Rehab PT Goals PT Goal Formulation: No goals set, d/c therapy    Frequency     Barriers to discharge        Co-evaluation  End of Session Equipment Utilized During Treatment: Gait belt Activity Tolerance: Patient tolerated treatment well Patient left: in chair;with call bell/phone within reach Nurse Communication: Mobility status         Time: 4709-6283 PT Time Calculation (min): 13 min   Charges:   PT Evaluation $Initial PT Evaluation Tier I: 1 Procedure     PT G Codes:          Nimrat Woolworth 01/27/2014, 11:14 AM Pager [phone]

## 2014-01-18 NOTE — Progress Notes (Signed)
CBG 55 2 glasses of Orange juice provided. Repeat CBG 72.

## 2014-01-18 NOTE — Progress Notes (Signed)
Subjective:  Nausea and vomiting x 1 this morning, little appetite, but currently feels well  Objective: Vital signs in last 24 hours: Temp:  [97.7 F (36.5 C)-98.8 F (37.1 C)] 97.9 F (36.6 C) (08/24 1200) Pulse Rate:  [73-81] 77 (08/24 1200) Resp:  [12-17] 16 (08/24 1200) BP: (118-155)/(56-88) 149/88 mmHg (08/24 1200) SpO2:  [98 %-100 %] 98 % (08/24 1200) Weight change:   Intake/Output from previous day: 08/23 0701 - 08/24 0700 In: 720 [P.O.:720] Out: -  Intake/Output this shift: Total I/O In: 980 [P.O.:980] Out: -   Lab Results:  Recent Labs  01/16/14 0603  WBC 7.3  HGB 11.1*  HCT 33.7*  PLT 149*   BMET:  Recent Labs  01/16/14 0603 01/18/14 0330  NA 134* 135*  K 5.1 4.9  CL 93* 94*  CO2 22 25  GLUCOSE 150* 148*  BUN 51* 44*  CREATININE 9.36* 8.08*  CALCIUM 8.8 8.9   No results found for this basename: PTH,  in the last 72 hours Iron Studies: No results found for this basename: IRON, TIBC, TRANSFERRIN, FERRITIN,  in the last 72 hours  Studies/Results: No results found.  EXAM: General appearance:  Alert, in no apparent distress Resp:  CTA without rales,rhonchi, or wheezes Cardio:  RRR without murmur or rub GI: + BS, soft and nontender Extremities:  No edema Access: R thigh AVG with + bruit  Dialysis prescription   TTS [facility]  3 hours 45 minutes   EDW 67.5 kg    2K2.5Ca    Right thigh AVG    15 gu needles  Hectorol 4 mcg TIW     Venofer 50 mg QThurs     Aranesp 100 QWeek(Tues)  No heparin d/t issues with prolonged bleeding     CBG QTMT and if BS >500 pt gives self 3 U novolog  Assessment/Plan: 1. Severe hyperglycemia/Brittle DM Type 1 w/ DKA - now with episodic hypoglycemia, nausea with poor PO intake, insulin adjusted, Phenergan increased. 2. ESRD - HD on TTS @ [facility], K 4.9.  HD tomorrow. 3. HTN/Volume - BP 149/88 on Metoprolol 100 mg qd; 3.5 L over EDW. 4. Anemia - Hgb 11.1 on Aranesp 100 mcg on Tues. 5. Sec HPT - Ca 8.9, P 5.7;  Hectorol 4 mcg, Phoslo 3 with meals. 6. Nutrition - renal carb-mod diet, multivitamin.   LOS: 4 days   LYLES,CHARLES 01/18/2014,2:58 PM  Pt seen, examined and agree w A/P as above.  Kelly Splinter MD pager [phone]    cell [phone] 01/18/2014, 4:59 PM

## 2014-01-18 NOTE — Progress Notes (Signed)
CRITICAL VALUE ALERT  Critical value received:  Glucose 53  Date of notification:  01/18/2014  Time of notification:  1204  Critical value read back:Yes.    Nurse who received alert:  Isidor Holts RN  MD notified (1st page):  Dr.McClung  Time of first page:    MD notified (2nd page):  Time of second page:  Responding MD:   Time MD responded:    Pt given D50% 25 mL

## 2014-01-18 NOTE — Progress Notes (Signed)
CRITICAL VALUE ALERT  Critical value received:  Glucose 40  Date of notification:  01/18/2014  Time of notification:  4076  Critical value read back:Yes.    Nurse who received alert:  Eshaan Titzer/Gabe RN  MD notified (1st page):  Dr.McClung  Time of first page:    MD notified (2nd page):  Time of second page:  Responding MD:    Time MD responded:   Pt Given 8 oz of Apple Juice and Graham Crackers with Peanut Butter

## 2014-01-18 NOTE — Progress Notes (Signed)
[place] TEAM 1 - Stepdown/ICU TEAM Progress Note  [name] BSJ:[id number] DOB: 01-07-1971 DOA: 01/14/2014 PCP: [name], MD  Admit HPI / Brief Narrative: 43 y/o female who has been admitted here multiple times recently for management of various infections (recent HCAP with septic shock requiring mechanical ventilation) and very poor glucose control.  Prior to this admit she had been struggling with headache, fatigue, nausea, vomiting and muscle aches. She came to the [facility] ED around midnight on 8/20 (early AM) complaining of dyspnea. She was discharged home on Levaquin and prednisone 20mg  tablets for what was felt to be bronchitis. At that point her blood sugar and potassium were normal. She took a lower dose than normal of glargine (only 3 units) because she was throwing up and could not eat. She returned to the ED later in the afternoon on 8/20 complaining of "feeling really bad". She was found to have a glucose > 800 and a potassium > 7.7. Apparently she had VTach captured on tele. She was given insulin IV and calcium. The Nephrology service saw her and arranged emergent hemodialysis.  She normally dialyzes TTS. Her last dialysis session was on 8/18. She had a history of non-compliance and a failed kindey and pancreas transplant. She currently denies trouble breathing.  Significant Events: 8/20 emergent HD 8/21 transfer to SDU   HPI/Subjective: Episodic hypoglycemia persists.  Pt c/o severe nausea that is keeping her from eating much.  She denies abdom pain, sob, cp, or f/c.    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  Code Status: FULL Family Communication: no family present at time of exam Disposition Plan: keep on SDU due to severe recurring hypoglycemia - d/c home when CBG stable   Consultants: Nephrology  Antibiotics: levaquin 8/19  DVT prophylaxis: lovenox  Objective: Blood pressure 149/88, pulse 77, temperature 97.9 F (36.6 C), temperature source Oral, resp. rate 16, height 5' (1.524 m), weight 71 kg (156 lb 8.4 oz), last menstrual period 12/26/2013, SpO2 98.00%.  Intake/Output Summary (Last 24 hours) at 01/18/14 1359 Last data filed at 01/18/14 0900  Gross per 24 hour  Intake    980 ml  Output      0 ml  Net    980 ml   Exam: General: No acute respiratory distress Lungs: Clear to auscultation bilaterally without wheezes or crackles Cardiovascular: Regular rate and rhythm without murmur gallop or rub  Abdomen: Nontender, nondistended, soft, bowel sounds positive, no rebound, no ascites, no appreciable mass Extremities: No significant cyanosis, clubbing, edema bilateral lower extremities  Data Reviewed: Basic Metabolic Panel:  Recent Labs Lab 01/14/14 2025 01/15/14 0125 01/15/14 0544 01/15/14 1853 01/16/14 0603 01/18/14 0330  NA 134* 136* 135* 130* 134* 135*  K 3.7 4.0 4.4 5.0 5.1 4.9  CL 92* 94* 94* 89* 93* 94*  CO2 23 24 22 19 22 25   GLUCOSE 285* 268* 429* 625* 150* 148*  BUN 27* 22 26* 39* 51* 44*  CREATININE 5.17* 5.66* 6.43* 7.89* 9.36* 8.08*   CALCIUM 9.1 8.8 8.6 8.7 8.8 8.9  MG  --   --  2.0  --  2.0  --   PHOS 3.3 4.8* 6.0*  --  5.7*  --    Liver Function Tests:  Recent Labs Lab 01/14/14 1452 01/14/14 2025 01/15/14 0544  AST 13  --   --   ALT 8  --   --   ALKPHOS 167*  --   --   BILITOT 0.4  --   --   PROT 8.4*  --   --   ALBUMIN 3.7 3.7 3.1*   CBC:  Recent Labs Lab 01/14/14 0216 01/14/14 0223 01/14/14 1452 01/14/14 1953 01/15/14 0125 01/16/14 0603  WBC 5.6  --  6.0  --  4.1 7.3  NEUTROABS  --   --  5.4  --   --   --   HGB 11.6* 13.3 13.4 12.9 11.2* 11.1*  HCT 34.6* 39.0 41.6 38.0 34.0* 33.7*  MCV 98.0  --  102.0*  --  98.8 98.3  PLT 156  --  169  --  115* 149*   CBG:  Recent Labs Lab 01/18/14 0651 01/18/14 0728 01/18/14 1116 01/18/14 1204 01/18/14 1250  GLUCAP 55* 74 40* 53* 111*    Recent Results (from the past 240 hour(s))  MRSA PCR SCREENING     Status: None   Collection Time    01/14/14 11:39 PM      Result Value Ref Range Status   MRSA by PCR NEGATIVE  NEGATIVE Final   Comment:            The GeneXpert MRSA Assay (FDA     approved for NASAL specimens     only), is one component of a     comprehensive MRSA colonization     surveillance program. It is not     intended to diagnose MRSA     infection nor to guide or     monitor treatment for     MRSA infections.    Studies:  Recent x-ray studies have been reviewed in detail by the Attending Physician  Scheduled Meds:  Scheduled Meds: . aspirin EC  81 mg Oral Daily  . calcium acetate  2,001 mg Oral TID WC  . clonazePAM  1 mg Oral QHS  . [START ON 01/19/2014] darbepoetin (ARANESP) injection - DIALYSIS  100 mcg Intravenous Q Tue-HD  . doxercalciferol  4 mcg Intravenous Q T,Th,Sa-HD  . enoxaparin (LOVENOX) injection  30 mg Subcutaneous Daily  . gabapentin  100 mg Oral Daily  . insulin aspart  0-9 Units Subcutaneous TID WC  . insulin aspart  2 Units Subcutaneous TID WC  . insulin glargine  4 Units Subcutaneous BID  .  metoCLOPramide  5 mg Oral TID AC & HS  . metoprolol succinate  100 mg Oral QHS  . multivitamin  1 tablet Oral QHS  . pantoprazole  40 mg Oral QHS    Time spent on care of this patient: 25 mins   Isel Skufca T , MD   [facility] Office  [phone] Pager - Text Page per Shea Evans as per below:  On-Call/Text Page:      [email or web address]      password [email or web address]  If 7PM-7AM, please contact night-coverage [email or web address] Password [email or web address] 01/18/2014, 1:59 PM   LOS: 4 days

## 2014-01-19 DIAGNOSIS — N185 Chronic kidney disease, stage 5: Secondary | ICD-10-CM

## 2014-01-19 DIAGNOSIS — E669 Obesity, unspecified: Secondary | ICD-10-CM

## 2014-01-19 DIAGNOSIS — D631 Anemia in chronic kidney disease: Secondary | ICD-10-CM

## 2014-01-19 DIAGNOSIS — E872 Acidosis, unspecified: Secondary | ICD-10-CM

## 2014-01-19 DIAGNOSIS — N039 Chronic nephritic syndrome with unspecified morphologic changes: Secondary | ICD-10-CM

## 2014-01-19 DIAGNOSIS — K3184 Gastroparesis: Secondary | ICD-10-CM

## 2014-01-19 LAB — CBC WITH DIFFERENTIAL/PLATELET
Basophils Absolute: 0 10*3/uL (ref 0.0–0.1)
Basophils Relative: 0 % (ref 0–1)
Eosinophils Absolute: 0.3 10*3/uL (ref 0.0–0.7)
Eosinophils Relative: 4 % (ref 0–5)
HCT: 36.3 % (ref 36.0–46.0)
Hemoglobin: 11.4 g/dL — ABNORMAL LOW (ref 12.0–15.0)
Lymphocytes Relative: 33 % (ref 12–46)
Lymphs Abs: 2.2 10*3/uL (ref 0.7–4.0)
MCH: 32 pg (ref 26.0–34.0)
MCHC: 31.4 g/dL (ref 30.0–36.0)
MCV: 102 fL — ABNORMAL HIGH (ref 78.0–100.0)
MONO ABS: 0.7 10*3/uL (ref 0.1–1.0)
Monocytes Relative: 11 % (ref 3–12)
Neutro Abs: 3.4 10*3/uL (ref 1.7–7.7)
Neutrophils Relative %: 52 % (ref 43–77)
Platelets: 180 10*3/uL (ref 150–400)
RBC: 3.56 MIL/uL — ABNORMAL LOW (ref 3.87–5.11)
RDW: 15.6 % — AB (ref 11.5–15.5)
WBC: 6.5 10*3/uL (ref 4.0–10.5)

## 2014-01-19 LAB — COMPREHENSIVE METABOLIC PANEL
ALT: 6 U/L (ref 0–35)
AST: 10 U/L (ref 0–37)
Albumin: 2.8 g/dL — ABNORMAL LOW (ref 3.5–5.2)
Alkaline Phosphatase: 109 U/L (ref 39–117)
Anion gap: 16 — ABNORMAL HIGH (ref 5–15)
BILIRUBIN TOTAL: 0.4 mg/dL (ref 0.3–1.2)
BUN: 71 mg/dL — AB (ref 6–23)
CALCIUM: 8.7 mg/dL (ref 8.4–10.5)
CHLORIDE: 92 meq/L — AB (ref 96–112)
CO2: 25 mEq/L (ref 19–32)
Creatinine, Ser: 11.09 mg/dL — ABNORMAL HIGH (ref 0.50–1.10)
GFR calc Af Amer: 4 mL/min — ABNORMAL LOW (ref >90)
GFR calc non Af Amer: 4 mL/min — ABNORMAL LOW (ref >90)
Glucose, Bld: 96 mg/dL (ref 70–99)
Potassium: 5.6 mEq/L — ABNORMAL HIGH (ref 3.7–5.3)
Sodium: 133 mEq/L — ABNORMAL LOW (ref 137–147)
TOTAL PROTEIN: 6.5 g/dL (ref 6.0–8.3)

## 2014-01-19 LAB — GLUCOSE, CAPILLARY
GLUCOSE-CAPILLARY: 470 mg/dL — AB (ref 70–99)
GLUCOSE-CAPILLARY: 265 mg/dL — AB (ref 70–99)
GLUCOSE-CAPILLARY: 487 mg/dL — AB (ref 70–99)
Glucose-Capillary: 121 mg/dL — ABNORMAL HIGH (ref 70–99)
Glucose-Capillary: 163 mg/dL — ABNORMAL HIGH (ref 70–99)
Glucose-Capillary: 110 mg/dL — ABNORMAL HIGH (ref 70–99)

## 2014-01-19 LAB — MAGNESIUM: MAGNESIUM: 2.3 mg/dL (ref 1.5–2.5)

## 2014-01-19 MED ORDER — INSULIN ASPART 100 UNIT/ML ~~LOC~~ SOLN: 5.0000 [IU] | Freq: Once | SUBCUTANEOUS | Status: DC

## 2014-01-19 MED ORDER — PROMETHAZINE HCL 25 MG/ML IJ SOLN
INTRAMUSCULAR | Status: AC
  Administered 2014-01-19: 12.5 mg via INTRAVENOUS
  Filled 2014-01-19: qty 1

## 2014-01-19 MED ORDER — INSULIN ASPART 100 UNIT/ML ~~LOC~~ SOLN
0.0000 [IU] | SUBCUTANEOUS | Status: DC
  Administered 2014-01-19: 15 [IU] via SUBCUTANEOUS
  Administered 2014-01-20: 2 [IU] via SUBCUTANEOUS
  Administered 2014-01-20: 8 [IU] via SUBCUTANEOUS
  Administered 2014-01-20: 5 [IU] via SUBCUTANEOUS

## 2014-01-19 MED ORDER — METOPROLOL SUCCINATE ER 50 MG PO TB24
75.0000 mg | ORAL_TABLET | Freq: Every day | ORAL | Status: DC
  Filled 2014-01-19 (×2): qty 1

## 2014-01-19 MED ORDER — DARBEPOETIN ALFA-POLYSORBATE 60 MCG/0.3ML IJ SOLN: 60.0000 ug | INTRAMUSCULAR | Status: DC

## 2014-01-19 MED ORDER — DOXERCALCIFEROL 4 MCG/2ML IV SOLN
INTRAVENOUS | Status: AC
  Administered 2014-01-19: 4 ug via INTRAVENOUS
  Filled 2014-01-19: qty 2

## 2014-01-19 MED ORDER — DARBEPOETIN ALFA-POLYSORBATE 60 MCG/0.3ML IJ SOLN
60.0000 ug | INTRAMUSCULAR | Status: DC
  Filled 2014-01-19: qty 0.3

## 2014-01-19 MED ORDER — DARBEPOETIN ALFA-POLYSORBATE 100 MCG/0.5ML IJ SOLN
INTRAMUSCULAR | Status: AC
  Administered 2014-01-19: 100 ug via INTRAVENOUS
  Filled 2014-01-19: qty 0.5

## 2014-01-19 NOTE — Procedures (Signed)
Patient was seen on dialysis and the procedure was supervised. BFR 400 Via R thigh AVG BP is 103/65.  Patient appears to be tolerating treatment well but reports N/V/malaise.

## 2014-01-19 NOTE — Progress Notes (Signed)
[place] TEAM 1 - Stepdown/ICU TEAM Progress Note  [name] JHE:[id number] DOB: [birth year] DOA: 01/14/2014 PCP: [name], MD  Admit HPI / Brief Narrative: 43 y/o BF PMHx Diabetes Type 1 since childhood, S./P Kidney - Pancreas transplant 2008, ESRD on HD T/Th/Sat, Hx hypoparathyroidism, HTN, HLD, RLS, Chronic Pain Syndrome, Hx medication noncompliance. Been admitted here multiple times recently for management of various infections (recent HCAP with septic shock requiring mechanical ventilation) and very poor glucose control. This week she has been struggling with headache, fatigue, nausea, vomiting and muscle aches. She came to the [facility] ED around midnight on 8/20 (early AM) complaining of dyspnea. She was discharged home on Levaquin and prednisone 20mg  tablets for what was felt to be bronchitis. At that point her blood sugar and potassium were normal. She took a lower dose than normal of glargine (only 3 units) because she was throwing up and could not eat. She returned to the ED later in the afternoon on 8/20 complaining of "feeling really bad". She was found to have a glucose > 800 and a potassium > 7.7. Apparently she had VTach captured on tele. She was given insulin IV and calcium. The nephrology service saw her and arranged emergent hemodialysis.  She dialyzes TTS. Her last dialysis session was on 8/18. She had a history of non-compliance and a failed kindey and pancreas transplant. She currently denies trouble breathing.   HPI/Subjective: 8/25 A/O. x4, patient states has had a rough dialysis session today. Negative N./V./SOB  Assessment/Plan: Severe Hyperglycemia - Brittle DM 1 w/ DKA  -Patient no longer on steroids and continues to run high CBG, increase SSI to moderate  -Continue Lantus 4 units BID -Continue NovoLog 2 units QAC  Hyperkalemia  -Resolved w/ HD as expected   Increased anion gap metabolic acidosis  -Due to combination of ESRD + DKA ; 8/25 AG= 16  before HD  ESRD on HD s/p Failed kidney/pancreas transplant  -not on chronic immunosuppression as per review of records (transitioned off early 2015) for her combination kidney/pancreas transplant. - Per Nephrology patient scheduled for HD on 8/26    Gastroparesis  Cont reglan at home dose; 5 mg QAC, QHS  Chronic anemia  -Due to CKD - Hgb has been stable   HTN  -BP currently reasonably controlled, although patient does experience episodes of hypotension which most likely are also contributing to her episodes of N./V. -Will decrease her metoprolol to 75 mg daily   Obesity - Body mass index is 30.57 kg/(m^2).    Code Status: FULL Family Communication: no family present at time of exam Disposition Plan: Per nephrology    Consultants: Dr. Roney Jaffe (nephrology)   Procedure/Significant Events: 8/20 emergent HD  8/21 transfer to SDU     Culture NA  Antibiotics: NA  DVT prophylaxis: Lovenox   Devices NA   LINES / TUBES:      Continuous Infusions:   Objective: VITAL SIGNS: Temp: 97.6 F (36.4 C) (08/25 0707) Temp src: Oral (08/25 0345) BP: 109/66 mmHg (08/25 0800) Pulse Rate: 73 (08/25 0800) SPO2; FIO2:   Intake/Output Summary (Last 24 hours) at 01/19/14 1856 Last data filed at 01/18/14 2220  Gross per 24 hour  Intake   1390 ml  Output      0 ml  Net   1390 ml     Exam: General: A./O. x4, NAD, No acute respiratory distress Lungs: Decreased lung sounds LLL/lingula, remainder of lung clear to auscultation without wheezes or crackles Cardiovascular:  Regular rate and rhythm without murmur gallop or rub normal S1 and S2 Abdomen: Nontender, nondistended, soft, bowel sounds positive, no rebound, no ascites, no appreciable mass Extremities: No significant cyanosis, clubbing, or edema bilateral lower extremities  Data Reviewed: Basic Metabolic Panel:  Recent Labs Lab 01/14/14 2025 01/15/14 0125 01/15/14 0544 01/15/14 1853 01/16/14 0603  01/18/14 0330  NA 134* 136* 135* 130* 134* 135*  K 3.7 4.0 4.4 5.0 5.1 4.9  CL 92* 94* 94* 89* 93* 94*  CO2 23 24 22 19 22 25   GLUCOSE 285* 268* 429* 625* 150* 148*  BUN 27* 22 26* 39* 51* 44*  CREATININE 5.17* 5.66* 6.43* 7.89* 9.36* 8.08*  CALCIUM 9.1 8.8 8.6 8.7 8.8 8.9  MG  --   --  2.0  --  2.0  --   PHOS 3.3 4.8* 6.0*  --  5.7*  --    Liver Function Tests:  Recent Labs Lab 01/14/14 1452 01/14/14 2025 01/15/14 0544  AST 13  --   --   ALT 8  --   --   ALKPHOS 167*  --   --   BILITOT 0.4  --   --   PROT 8.4*  --   --   ALBUMIN 3.7 3.7 3.1*   No results found for this basename: LIPASE, AMYLASE,  in the last 168 hours No results found for this basename: AMMONIA,  in the last 168 hours CBC:  Recent Labs Lab 01/14/14 0216  01/14/14 1452 01/14/14 1953 01/15/14 0125 01/16/14 0603 01/19/14 0758  WBC 5.6  --  6.0  --  4.1 7.3 6.5  NEUTROABS  --   --  5.4  --   --   --  3.4  HGB 11.6*  < > 13.4 12.9 11.2* 11.1* 11.4*  HCT 34.6*  < > 41.6 38.0 34.0* 33.7* 36.3  MCV 98.0  --  102.0*  --  98.8 98.3 102.0*  PLT 156  --  169  --  115* 149* 180  < > = values in this interval not displayed. Cardiac Enzymes: No results found for this basename: CKTOTAL, CKMB, CKMBINDEX, TROPONINI,  in the last 168 hours BNP (last 3 results)  Recent Labs  05/03/13 0653 06/27/13 1510 01/14/14 0216  PROBNP 33341.0* 5305.0* 17541.0*   CBG:  Recent Labs Lab 01/18/14 1942 01/18/14 2312 01/19/14 0321 01/19/14 0511 01/19/14 0638  GLUCAP 148* 176* 470* 265* 121*    Recent Results (from the past 240 hour(s))  MRSA PCR SCREENING     Status: None   Collection Time    01/14/14 11:39 PM      Result Value Ref Range Status   MRSA by PCR NEGATIVE  NEGATIVE Final   Comment:            The GeneXpert MRSA Assay (FDA     approved for NASAL specimens     only), is one component of a     comprehensive MRSA colonization     surveillance program. It is not     intended to diagnose MRSA      infection nor to guide or     monitor treatment for     MRSA infections.     Studies:  Recent x-ray studies have been reviewed in detail by the Attending Physician  Scheduled Meds:  Scheduled Meds: . aspirin EC  81 mg Oral Daily  . calcium acetate  2,001 mg Oral TID WC  . clonazePAM  1 mg Oral QHS  .  darbepoetin (ARANESP) injection - DIALYSIS  100 mcg Intravenous Q Tue-HD  . doxercalciferol  4 mcg Intravenous Q T,Th,Sa-HD  . enoxaparin (LOVENOX) injection  30 mg Subcutaneous Daily  . gabapentin  100 mg Oral Daily  . insulin aspart  0-9 Units Subcutaneous TID WC  . insulin aspart  2 Units Subcutaneous TID WC  . insulin aspart  5 Units Subcutaneous Once  . insulin glargine  4 Units Subcutaneous BID  . metoCLOPramide  5 mg Oral TID AC & HS  . metoprolol succinate  100 mg Oral QHS  . multivitamin  1 tablet Oral QHS  . pantoprazole  40 mg Oral QHS    Time spent on care of this patient: 40 mins   Allie Bossier , MD   [facility] Office  [phone] Pager [phone]  On-Call/Text Page:      [email or web address]      password [email or web address]  If 7PM-7AM, please contact night-coverage [email or web address] Password [email or web address] 01/19/2014, 8:52 AM   LOS: 5 days

## 2014-01-19 NOTE — Progress Notes (Signed)
Pt c/o being sweaty VS obtained. CBG checked = 407 Forrest Moron NP notified. New orders received. Novolog 5U administered Dargan as ordered. Will repeat CBG.

## 2014-01-20 LAB — GLUCOSE, CAPILLARY
GLUCOSE-CAPILLARY: 102 mg/dL — AB (ref 70–99)
GLUCOSE-CAPILLARY: 136 mg/dL — AB (ref 70–99)
GLUCOSE-CAPILLARY: 20 mg/dL — AB (ref 70–99)
GLUCOSE-CAPILLARY: 220 mg/dL — AB (ref 70–99)
GLUCOSE-CAPILLARY: 287 mg/dL — AB (ref 70–99)
Glucose-Capillary: 19 mg/dL — CL (ref 70–99)
Glucose-Capillary: 95 mg/dL (ref 70–99)
Glucose-Capillary: 272 mg/dL — ABNORMAL HIGH (ref 70–99)

## 2014-01-20 MED ORDER — SODIUM CHLORIDE 0.9 % IJ SOLN: 3.0000 mL | INTRAMUSCULAR | Status: DC | PRN

## 2014-01-20 MED ORDER — SODIUM CHLORIDE 0.9 % IJ SOLN
3.0000 mL | Freq: Two times a day (BID) | INTRAMUSCULAR | Status: DC
  Administered 2014-01-20 – 2014-01-22 (×5): 3 mL via INTRAVENOUS

## 2014-01-20 MED ORDER — METOPROLOL SUCCINATE ER 50 MG PO TB24
75.0000 mg | ORAL_TABLET | ORAL | Status: DC
  Filled 2014-01-20: qty 1

## 2014-01-20 MED ORDER — INSULIN ASPART 100 UNIT/ML ~~LOC~~ SOLN
2.0000 [IU] | Freq: Three times a day (TID) | SUBCUTANEOUS | Status: DC
  Administered 2014-01-20: 5 [IU] via SUBCUTANEOUS
  Administered 2014-01-21: 9 [IU] via SUBCUTANEOUS

## 2014-01-20 MED ORDER — INSULIN GLARGINE 100 UNIT/ML ~~LOC~~ SOLN
6.0000 [IU] | Freq: Two times a day (BID) | SUBCUTANEOUS | Status: DC
  Administered 2014-01-20 – 2014-01-21 (×2): 6 [IU] via SUBCUTANEOUS
  Filled 2014-01-20 (×3): qty 0.06

## 2014-01-20 NOTE — Progress Notes (Signed)
Inpatient Diabetes Program Recommendations  AACE/ADA: New Consensus Statement on Inpatient Glycemic Control (2013)  Target Ranges:  Prepandial:   less than 140 mg/dL      Peak postprandial:   less than 180 mg/dL (1-2 hours)      Critically ill patients:  140 - 180 mg/dL   Results for LYNDA, WANNINGER (MRN [id number]) as of 01/20/2014 09:24  Ref. Range 01/19/2014 06:38 01/19/2014 11:58 01/19/2014 16:13 01/19/2014 20:36 01/20/2014 00:06 01/20/2014 03:56 01/20/2014 08:30 01/20/2014 08:32 01/20/2014 09:10  Glucose-Capillary Latest Range: 70-99 mg/dL 121 (H) 163 (H) 487 (H) 110 (H) 136 (H) 220 (H) 19 (LL) 20 (LL) 95    Diabetes history: DM1 (very brittle) Outpatient Diabetes medications: Lantus 7 units BID, Novolog 6 units BID with meals Current orders for Inpatient glycemic control: Lantus 4 units BID, Novolog 2 units TID with meals, Novolog 0-15 units Q4H  Inpatient Diabetes Program Recommendations Correction (SSI): Please decrease Novolog correction scale to sensitive scale and customize scale to start correction to begin with CBG of 151 mg/dl. Also, please change frequency from Q4H to ACHS. Insulin - Meal Coverage: NURSING: please give Novolog meal coverage when patient eats at least 50% of meals and CBG is greater than 80 mg/dl.   Note: In reviewing the chart, patient was NOT given any meal coverage on 01/19/14 at all. Anticipate CBG up to 487 mg/dl at 16:13 due to meal being consumed without any Novolog coverage. CBG this morning 19 mg/dl at 8:30 this morning (likely from receiving Novolog moderate correction for 220 mg/dl at 3:56). Noted in prior Diabetes Coordinator notes that patient reports that she usually only eats 2 meals per day. In order to prevent further hypoglycemic events, recommend decreasing Novolog correction scale to sensitive and customize scale to start correction with CBG of 151 mg/dl and change frequency from Q4H to ACHS.   NURSING: if patient eats at least 50% of meal and CBG > 80  mg/dl, please be sure to give Novolog meal coverage as ordered.  Thanks, Barnie Alderman, RN, MSN, CCRN Diabetes Coordinator Inpatient Diabetes Program [phone] (Team Pager) [phone] (AP office) [phone] [facility] office)

## 2014-01-20 NOTE — Progress Notes (Signed)
[place] TEAM 1 - Stepdown/ICU TEAM Progress Note  [name] TMH:[id number] DOB: May 17, 1971 DOA: 01/14/2014 PCP: [name], MD  Admit HPI / Brief Narrative: 43 y/o female who has been admitted here multiple times recently for management of various infections (recent HCAP with septic shock requiring mechanical ventilation) and very poor glucose control.  Prior to this admit she had been struggling with headache, fatigue, nausea, vomiting and muscle aches. She came to the [facility] ED around midnight on 8/20 (early AM) complaining of dyspnea. She was discharged home on Levaquin and prednisone 20mg  tablets for what was felt to be bronchitis. At that point her blood sugar and potassium were normal. She took a lower dose than normal of glargine (only 3 units) because she was throwing up and could not eat. She returned to the ED later in the afternoon on 8/20 complaining of "feeling really bad". She was found to have a glucose > 800 and a potassium > 7.7. Apparently she had VTach captured on tele. She was given insulin IV and calcium. The Nephrology service saw her and arranged emergent hemodialysis.  She normally dialyzes TTS. Her last dialysis session was on 8/18. She had a history of non-compliance and a failed kindey and pancreas transplant. She currently denies trouble breathing.  Significant Events: 8/20 emergent HD 8/21 transfer to SDU   HPI/Subjective: Episodic hypoglycemia persists.  Pt c/o severe nausea that is keeping her from eating much.  She denies abdom pain, sob, cp, or f/c.    Assessment/Plan:  Severe Hyperglycemia / Severe Hypoglycemia - Brittle DM 1 w/ DKA  DKA initially caused by prednisone, poor po intake, and intentionally taking lower dose of glargine - stopped prednisone (review of records reveals she is not on chronic prednisone) and DKA rapildy corrected w/ IV insulin and HD - w/ resumption of scheduled insulin, pt has swung wildly from hyperglycemia to severe  hypoglycemia (as low as teens) - pt remains conscious and has tolerated well thus far - will cont to adjust tx regimen - need to see CBG more stable prior to d/c - decrease freqeuncy of SSI to AC/HS - stop treating w/ SSI untless over 150  Hyperkalemia Tx w/ HD   Increased anion gap metabolic acidosis Due to combination of ESRD + DKA - DKA resolved - HD has corrected acidosis    ESRD on HD s/p Failed kidney/pancreas transplant not on chronic immunosuppression as per review of records (transitioned off early 2015) - Nephrology following for HD   Gastroparesis  Cont reglan at home dose - nausea has improved as has intake   Chronic anemia  Due to CKD - Hgb has been stable  HTN BP currently reasonably controlled  Obesity - Body mass index is 30.44 kg/(m^2).  Code Status: FULL Family Communication: no family present at time of exam Disposition Plan: keep on SDU due to severe recurring hypoglycemia - d/c home when CBG stable   Consultants: Nephrology  Antibiotics: levaquin 8/19  DVT prophylaxis: lovenox  Objective: Blood pressure 128/61, pulse 91, temperature 98.3 F (36.8 C), temperature source Oral, resp. rate 24, height 5' (1.524 m), weight 70.7 kg (155 lb 13.8 oz), last menstrual period 12/26/2013, SpO2 98.00%.  Intake/Output Summary (Last 24 hours) at 01/20/14 1649 Last data filed at 01/20/14 1606  Gross per 24 hour  Intake    480 ml  Output      0 ml  Net    480 ml   Exam: General: No acute respiratory distress  Lungs: Clear to auscultation bilaterally without wheezes  Cardiovascular: Regular rate and rhythm without murmur gallop or rub  Abdomen: Nontender, nondistended, soft, bowel sounds positive, no rebound, no ascites, no appreciable mass Extremities: No significant cyanosis, clubbing, edema bilateral lower extremities  Data Reviewed: Basic Metabolic Panel:  Recent Labs Lab 01/14/14 2025 01/15/14 0125 01/15/14 0544 01/15/14 1853 01/16/14 0603  01/18/14 0330 01/19/14 0758  NA 134* 136* 135* 130* 134* 135* 133*  K 3.7 4.0 4.4 5.0 5.1 4.9 5.6*  CL 92* 94* 94* 89* 93* 94* 92*  CO2 23 24 22 19 22 25 25   GLUCOSE 285* 268* 429* 625* 150* 148* 96  BUN 27* 22 26* 39* 51* 44* 71*  CREATININE 5.17* 5.66* 6.43* 7.89* 9.36* 8.08* 11.09*  CALCIUM 9.1 8.8 8.6 8.7 8.8 8.9 8.7  MG  --   --  2.0  --  2.0  --  2.3  PHOS 3.3 4.8* 6.0*  --  5.7*  --   --    Liver Function Tests:  Recent Labs Lab 01/14/14 1452 01/14/14 2025 01/15/14 0544 01/19/14 0758  AST 13  --   --  10  ALT 8  --   --  6  ALKPHOS 167*  --   --  109  BILITOT 0.4  --   --  0.4  PROT 8.4*  --   --  6.5  ALBUMIN 3.7 3.7 3.1* 2.8*   CBC:  Recent Labs Lab 01/14/14 0216  01/14/14 1452 01/14/14 1953 01/15/14 0125 01/16/14 0603 01/19/14 0758  WBC 5.6  --  6.0  --  4.1 7.3 6.5  NEUTROABS  --   --  5.4  --   --   --  3.4  HGB 11.6*  < > 13.4 12.9 11.2* 11.1* 11.4*  HCT 34.6*  < > 41.6 38.0 34.0* 33.7* 36.3  MCV 98.0  --  102.0*  --  98.8 98.3 102.0*  PLT 156  --  169  --  115* 149* 180  < > = values in this interval not displayed. CBG:  Recent Labs Lab 01/20/14 0830 01/20/14 0832 01/20/14 0910 01/20/14 1202 01/20/14 1602  GLUCAP 19* 20* 95 272* 287*    Recent Results (from the past 240 hour(s))  MRSA PCR SCREENING     Status: None   Collection Time    01/14/14 11:39 PM      Result Value Ref Range Status   MRSA by PCR NEGATIVE  NEGATIVE Final   Comment:            The GeneXpert MRSA Assay (FDA     approved for NASAL specimens     only), is one component of a     comprehensive MRSA colonization     surveillance program. It is not     intended to diagnose MRSA     infection nor to guide or     monitor treatment for     MRSA infections.    Studies:  Recent x-ray studies have been reviewed in detail by the Attending Physician  Scheduled Meds:  Scheduled Meds: . aspirin EC  81 mg Oral Daily  . calcium acetate  2,001 mg Oral TID WC  .  clonazePAM  1 mg Oral QHS  . [START ON 01/26/2014] darbepoetin (ARANESP) injection - DIALYSIS  60 mcg Intravenous Q Tue-HD  . doxercalciferol  4 mcg Intravenous Q T,Th,Sa-HD  . enoxaparin (LOVENOX) injection  30 mg Subcutaneous Daily  . gabapentin  100 mg Oral  Daily  . insulin aspart  0-15 Units Subcutaneous 6 times per day  . insulin aspart  2 Units Subcutaneous TID WC  . insulin aspart  5 Units Subcutaneous Once  . insulin glargine  4 Units Subcutaneous BID  . metoCLOPramide  5 mg Oral TID AC & HS  . [START ON 01/21/2014] metoprolol succinate  75 mg Oral Q T,Th,S,Su-1800  . multivitamin  1 tablet Oral QHS  . pantoprazole  40 mg Oral QHS  . sodium chloride  3 mL Intravenous Q12H    Time spent on care of this patient: 35 mins   Moksha Dorgan T , MD   [facility] Office  [phone] Pager - Text Page per Shea Evans as per below:  On-Call/Text Page:      [email or web address]      password [email or web address]  If 7PM-7AM, please contact night-coverage [email or web address] Password [email or web address] 01/20/2014, 4:49 PM   LOS: 6 days

## 2014-01-20 NOTE — Progress Notes (Signed)
Hypoglycemic Event  CBG :20  Treatment: D50 IV 25 mL  Symptoms: Pale  Follow-up CBG: Time: 0915 CBG Result: 95  Possible Reasons for Event: Inadequate meal intake and Medication regimen: breakfast  Comments/MD notified:yes.  See NO    [name] [name]  Remember to initiate Hypoglycemia Order Set & complete

## 2014-01-20 NOTE — Progress Notes (Signed)
[facility] Progress Note   Subjective: "I dont' feel good today", nothing specific.  BS 19 this am.    Filed Vitals:   01/20/14 0015 01/20/14 0400 01/20/14 0447 01/20/14 0800  BP:  163/76  149/63  Pulse:    79  Temp: 98.5 F (36.9 C)  98.2 F (36.8 C) 98.9 F (37.2 C)  TempSrc: Oral  Oral Oral  Resp:  19  13  Height:      Weight:   70.7 kg (155 lb 13.8 oz)   SpO2:    94%   Exam: Alert, no distress No jvd Chest clear bilat RRR soft SEM no RG Abd soft, NTND, +BS No LE edema R thigh AVG +bruit Neuro is alert, ox 3  HD: TTS NW 3h 47min   67.5kg   2/2.5 Bath   R thigh AVG   15 ga needles   Heparin none d/t prolonged bleeding issues Hectorol 4 ug TIW, Venofer 50 mg q Thurs,  Aranesp 100 ug q Tues        Assessment: 1 Hyper-/hypoglycemia / DM type 1 - per primary 2 ESRD on HD 3 HTN/ vol on MTP, will change to TTSS likes she takes it at home. Still 3 kg up, no symptoms 4 Anemia on aranesp 5 HPTH cont vit D, phoslo 3ac , P 5.7  Plan- HD tomorrow, max UF as tolerated    Kelly Splinter MD  pager [phone]    cell [phone]  01/20/2014, 10:42 AM     Recent Labs Lab 01/15/14 0125 01/15/14 0544  01/16/14 0603 01/18/14 0330 01/19/14 0758  NA 136* 135*  < > 134* 135* 133*  K 4.0 4.4  < > 5.1 4.9 5.6*  CL 94* 94*  < > 93* 94* 92*  CO2 24 22  < > 22 25 25   GLUCOSE 268* 429*  < > 150* 148* 96  BUN 22 26*  < > 51* 44* 71*  CREATININE 5.66* 6.43*  < > 9.36* 8.08* 11.09*  CALCIUM 8.8 8.6  < > 8.8 8.9 8.7  PHOS 4.8* 6.0*  --  5.7*  --   --   < > = values in this interval not displayed.  Recent Labs Lab 01/14/14 1452 01/14/14 2025 01/15/14 0544 01/19/14 0758  AST 13  --   --  10  ALT 8  --   --  6  ALKPHOS 167*  --   --  109  BILITOT 0.4  --   --  0.4  PROT 8.4*  --   --  6.5  ALBUMIN 3.7 3.7 3.1* 2.8*    Recent Labs Lab 01/14/14 1452  01/15/14 0125 01/16/14 0603 01/19/14 0758  WBC 6.0  --  4.1 7.3 6.5  NEUTROABS 5.4  --   --   --  3.4   HGB 13.4  < > 11.2* 11.1* 11.4*  HCT 41.6  < > 34.0* 33.7* 36.3  MCV 102.0*  --  98.8 98.3 102.0*  PLT 169  --  115* 149* 180  < > = values in this interval not displayed. Marland Kitchen aspirin EC  81 mg Oral Daily  . calcium acetate  2,001 mg Oral TID WC  . clonazePAM  1 mg Oral QHS  . [START ON 01/26/2014] darbepoetin (ARANESP) injection - DIALYSIS  60 mcg Intravenous Q Tue-HD  . doxercalciferol  4 mcg Intravenous Q T,Th,Sa-HD  . enoxaparin (LOVENOX) injection  30 mg Subcutaneous Daily  . gabapentin  100 mg Oral Daily  .  insulin aspart  0-15 Units Subcutaneous 6 times per day  . insulin aspart  2 Units Subcutaneous TID WC  . insulin aspart  5 Units Subcutaneous Once  . insulin glargine  4 Units Subcutaneous BID  . metoCLOPramide  5 mg Oral TID AC & HS  . [START ON 01/21/2014] metoprolol succinate  75 mg Oral Q T,Th,S,Su-1800  . multivitamin  1 tablet Oral QHS  . pantoprazole  40 mg Oral QHS     sodium chloride, acetaminophen, albuterol, dextrose, ondansetron, oxyCODONE, promethazine, zolpidem

## 2014-01-21 DIAGNOSIS — E1010 Type 1 diabetes mellitus with ketoacidosis without coma: Secondary | ICD-10-CM

## 2014-01-21 LAB — RENAL FUNCTION PANEL
Albumin: 3.1 g/dL — ABNORMAL LOW (ref 3.5–5.2)
Anion gap: 18 — ABNORMAL HIGH (ref 5–15)
BUN: 73 mg/dL — AB (ref 6–23)
CALCIUM: 9.9 mg/dL (ref 8.4–10.5)
CO2: 24 meq/L (ref 19–32)
CREATININE: 10.25 mg/dL — AB (ref 0.50–1.10)
Chloride: 90 mEq/L — ABNORMAL LOW (ref 96–112)
GFR calc Af Amer: 5 mL/min — ABNORMAL LOW (ref >90)
GFR, EST NON AFRICAN AMERICAN: 4 mL/min — AB (ref >90)
Glucose, Bld: 67 mg/dL — ABNORMAL LOW (ref 70–99)
Phosphorus: 4 mg/dL (ref 2.3–4.6)
Potassium: 6 mEq/L — ABNORMAL HIGH (ref 3.7–5.3)
Sodium: 132 mEq/L — ABNORMAL LOW (ref 137–147)

## 2014-01-21 LAB — BASIC METABOLIC PANEL
ANION GAP: 12 (ref 5–15)
BUN: 25 mg/dL — ABNORMAL HIGH (ref 6–23)
CALCIUM: 9 mg/dL (ref 8.4–10.5)
CHLORIDE: 87 meq/L — AB (ref 96–112)
CO2: 26 meq/L (ref 19–32)
Creatinine, Ser: 5.12 mg/dL — ABNORMAL HIGH (ref 0.50–1.10)
GFR calc Af Amer: 11 mL/min — ABNORMAL LOW (ref >90)
GFR calc non Af Amer: 10 mL/min — ABNORMAL LOW (ref >90)
GLUCOSE: 555 mg/dL — AB (ref 70–99)
POTASSIUM: 7.2 meq/L — AB (ref 3.7–5.3)
SODIUM: 125 meq/L — AB (ref 137–147)

## 2014-01-21 LAB — GLUCOSE, CAPILLARY
GLUCOSE-CAPILLARY: 128 mg/dL — AB (ref 70–99)
GLUCOSE-CAPILLARY: 157 mg/dL — AB (ref 70–99)
GLUCOSE-CAPILLARY: 577 mg/dL — AB (ref 70–99)
Glucose-Capillary: 217 mg/dL — ABNORMAL HIGH (ref 70–99)
Glucose-Capillary: 447 mg/dL — ABNORMAL HIGH (ref 70–99)
Glucose-Capillary: 11 mg/dL — CL (ref 70–99)
Glucose-Capillary: 344 mg/dL — ABNORMAL HIGH (ref 70–99)

## 2014-01-21 LAB — RETICULOCYTES
RBC.: 3.71 MIL/uL — ABNORMAL LOW (ref 3.87–5.11)
RETIC COUNT ABSOLUTE: 107.6 10*3/uL (ref 19.0–186.0)
RETIC CT PCT: 2.9 % (ref 0.4–3.1)

## 2014-01-21 LAB — CBC
HEMATOCRIT: 34.6 % — AB (ref 36.0–46.0)
Hemoglobin: 11.1 g/dL — ABNORMAL LOW (ref 12.0–15.0)
MCH: 32.6 pg (ref 26.0–34.0)
MCHC: 32.1 g/dL (ref 30.0–36.0)
MCV: 101.8 fL — AB (ref 78.0–100.0)
PLATELETS: 152 10*3/uL (ref 150–400)
RBC: 3.4 MIL/uL — AB (ref 3.87–5.11)
RDW: 15.4 % (ref 11.5–15.5)
WBC: 5.8 10*3/uL (ref 4.0–10.5)

## 2014-01-21 MED ORDER — INSULIN GLARGINE 100 UNIT/ML ~~LOC~~ SOLN
6.0000 [IU] | Freq: Every evening | SUBCUTANEOUS | Status: DC
  Filled 2014-01-21: qty 0.06

## 2014-01-21 MED ORDER — SODIUM CHLORIDE 0.9 % IV SOLN: 100.0000 mL | INTRAVENOUS | Status: DC | PRN

## 2014-01-21 MED ORDER — INSULIN GLARGINE 100 UNIT/ML ~~LOC~~ SOLN
7.0000 [IU] | Freq: Every morning | SUBCUTANEOUS | Status: DC
  Administered 2014-01-22: 7 [IU] via SUBCUTANEOUS
  Filled 2014-01-21: qty 0.07

## 2014-01-21 MED ORDER — PROMETHAZINE HCL 25 MG/ML IJ SOLN
INTRAMUSCULAR | Status: AC
  Administered 2014-01-21: 12.5 mg via INTRAVENOUS
  Filled 2014-01-21: qty 1

## 2014-01-21 MED ORDER — DOXERCALCIFEROL 4 MCG/2ML IV SOLN
INTRAVENOUS | Status: AC
  Administered 2014-01-21: 4 ug via INTRAVENOUS
  Filled 2014-01-21: qty 2

## 2014-01-21 MED ORDER — NEPRO/CARBSTEADY PO LIQD
237.0000 mL | ORAL | Status: DC | PRN
Start: 2014-01-21 — End: 2014-01-22

## 2014-01-21 MED ORDER — INSULIN ASPART 100 UNIT/ML ~~LOC~~ SOLN
8.0000 [IU] | Freq: Once | SUBCUTANEOUS | Status: AC
  Administered 2014-01-21: 8 [IU] via SUBCUTANEOUS

## 2014-01-21 MED ORDER — HEPARIN SODIUM (PORCINE) 1000 UNIT/ML DIALYSIS
1000.0000 [IU] | INTRAMUSCULAR | Status: DC | PRN
  Filled 2014-01-21: qty 1

## 2014-01-21 MED ORDER — LIDOCAINE-PRILOCAINE 2.5-2.5 % EX CREA
1.0000 | TOPICAL_CREAM | CUTANEOUS | Status: DC | PRN
Start: 2014-01-21 — End: 2014-01-22
  Filled 2014-01-21: qty 5

## 2014-01-21 MED ORDER — INSULIN GLARGINE 100 UNIT/ML ~~LOC~~ SOLN: 7.0000 [IU] | Freq: Every morning | SUBCUTANEOUS | Status: DC

## 2014-01-21 MED ORDER — INSULIN ASPART 100 UNIT/ML ~~LOC~~ SOLN
2.0000 [IU] | Freq: Three times a day (TID) | SUBCUTANEOUS | Status: DC
  Administered 2014-01-22 (×2): 1 [IU] via SUBCUTANEOUS

## 2014-01-21 MED ORDER — INSULIN ASPART 100 UNIT/ML ~~LOC~~ SOLN: 10.0000 [IU] | Freq: Once | SUBCUTANEOUS | Status: DC

## 2014-01-21 MED ORDER — SODIUM POLYSTYRENE SULFONATE 15 GM/60ML PO SUSP
15.0000 g | Freq: Once | ORAL | Status: DC
  Filled 2014-01-21: qty 60

## 2014-01-21 MED ORDER — PENTAFLUOROPROP-TETRAFLUOROETH EX AERO
INHALATION_SPRAY | CUTANEOUS | Status: AC
  Administered 2014-01-21: 1
  Filled 2014-01-21: qty 103.5

## 2014-01-21 MED ORDER — LIDOCAINE HCL (PF) 1 % IJ SOLN
5.0000 mL | INTRAMUSCULAR | Status: DC | PRN
Start: 2014-01-21 — End: 2014-01-22

## 2014-01-21 MED ORDER — ALTEPLASE 2 MG IJ SOLR
2.0000 mg | Freq: Once | INTRAMUSCULAR | Status: AC | PRN
  Filled 2014-01-21: qty 2

## 2014-01-21 MED ORDER — PENTAFLUOROPROP-TETRAFLUOROETH EX AERO: 1.0000 "application " | INHALATION_SPRAY | CUTANEOUS | Status: DC | PRN

## 2014-01-21 NOTE — Progress Notes (Signed)
CRITICAL VALUE ALERT  Critical value received: Potassium 7.2    Date of notification:  01/21/14  Time of notification:  2140  Critical value read back: yes  Nurse who received alert:  Leary Roca, RN  MD notified (1st page):  Forrest Moron, NP  Time of first page:  2215  MD notified (2nd page):  Time of second page:  Responding MD:  Forrest Moron, NP  Time MD responded:  2250

## 2014-01-21 NOTE — Progress Notes (Addendum)
Pt transiently refuses pulse ox. Pt also refused AM labs and stated she wanted them drawn in hemo today. HD called and pt scheduled for 2nd shift.   Prebreakfast CBG was 447. MD Sherral Hammers notified in person. 9 units novolog given as per MD order and lantus Qday changed to 7 units.  Prelunch CBG was 11. Hypoglycemic protocol initiated. 2 amps (55mL total) Dextrose 50% and 363mL of apple, orange, and grape juice with added sugar packets given. Pt was symptomatic with diaphoresis, delayed response and confusion. Pt monitored by RN Curt Bears and PCT Sula Soda during entire hypoglyemic event. CBG rechecked in 20 minutes and CBG was 128. Pt alert, oriented, and back to baseline mentation. MD Sherral Hammers notified and new sliding scale orders pending. HD RN Dain notified of pt's hypoglycemic event prior to transfer to HD. Vital signs remained stable throughout hypoglycemic event.

## 2014-01-21 NOTE — Progress Notes (Signed)
Pt's K+ resulted as 7.2 (no change in tele). Had HD today. Kayexalate ordered and novolog ordered for continued high blood sugar. Pt refused to take both of these medications even when explained why she needs them. However, in spite of high sugars, she continues to eat carbohydrate snacks in her room and is non compliant with diabetic diet. Clance Boll, NP [facility]

## 2014-01-21 NOTE — Progress Notes (Signed)
Pt has a Potassium of 7.2 and a blood sugar of 344.  Pt refused Kayexalate and Novolog ordered. Pt educated on the importance of receiving both medications. Forrest Moron, NP notified. Will continue to monitor.

## 2014-01-21 NOTE — Progress Notes (Signed)
[place] TEAM 1 - Stepdown/ICU TEAM Progress Note  [name] GBE:[id number] DOB: February 10, 1971 DOA: 01/14/2014 PCP: [name], MD  Admit HPI / Brief Narrative: 43 y/o BF PMHx Diabetes Type 1 since childhood, S./P Kidney - Pancreas transplant 2008, ESRD on HD T/Th/Sat, Hx hypoparathyroidism, HTN, HLD, RLS, Chronic Pain Syndrome, Hx medication noncompliance. Been admitted here multiple times recently for management of various infections (recent HCAP with septic shock requiring mechanical ventilation) and very poor glucose control. This week she has been struggling with headache, fatigue, nausea, vomiting and muscle aches. She came to the [facility] ED around midnight on 8/20 (early AM) complaining of dyspnea. She was discharged home on Levaquin and prednisone 20mg  tablets for what was felt to be bronchitis. At that point her blood sugar and potassium were normal. She took a lower dose than normal of glargine (only 3 units) because she was throwing up and could not eat. She returned to the ED later in the afternoon on 8/20 complaining of "feeling really bad". She was found to have a glucose > 800 and a potassium > 7.7. Apparently she had VTach captured on tele. She was given insulin IV and calcium. The nephrology service saw her and arranged emergent hemodialysis.  She dialyzes TTS. Her last dialysis session was on 8/18. She had a history of non-compliance and a failed kindey and pancreas transplant. She currently denies trouble breathing.   HPI/Subjective: 8/27 A/O. x4, patient states her BP medication and held with Tuesday and Wednesday evening. States still does not feel well, however was able to ambulate around ward. Currently Negative N./V./SOB. Patient had multiple episodes of hypoglycemia on Tuesday morning.  Assessment/Plan: Severe Hyperglycemia - Brittle DM 1 w/ DKA  -Patient no longer on steroids and continues to run high CBG, increase SSI to moderate  - stop treating w/ SSI  untless over 150 -Again patient has had a episode of hypoglycemia down to 11, following administration of NovoLog via current SSI; will decrease SSI  continue QAC/AHS CBG checks -Increase Lantus to 7 units every morning, and Lantus 6 units every afternoon   Hyperkalemia  -Prior to HD, repeat in the afternoon and at high treat with Kayexalate    Increased anion gap metabolic acidosis  -Due to combination of ESRD; patient HD today, check post HD  ESRD on HD s/p Failed kidney/pancreas transplant  -not on chronic immunosuppression as per review of records (transitioned off early 2015) for her combination kidney/pancreas transplant. - HD per nephrology    Gastroparesis  Cont reglan at home dose; 5 mg QAC, QHS  Chronic anemia  -Due to CKD - Hgb has been stable   HTN  -BP currently reasonably controlled, although patient does experience episodes of hypotension which most likely are also contributing to her episodes of N./V. -All BP medication held   Obesity - Body mass index is 30.57 kg/(m^2).    Code Status: FULL Family Communication: no family present at time of exam Disposition Plan: Per nephrology    Consultants: Dr. Roney Jaffe (nephrology)   Procedure/Significant Events: 8/20 emergent HD  8/21 transfer to SDU     Culture NA  Antibiotics: NA  DVT prophylaxis: Lovenox   Devices NA   LINES / TUBES:      Continuous Infusions:   Objective: VITAL SIGNS: Temp: 98.4 F (36.9 C) (08/27 0404) Temp src: Oral (08/27 0404) BP: 164/67 mmHg (08/27 0404) SPO2; FIO2:   Intake/Output Summary (Last 24 hours) at 01/21/14 0841 Last data filed at  01/21/14 0634  Gross per 24 hour  Intake   1280 ml  Output      0 ml  Net   1280 ml     Exam: General: A./O. x4, NAD, No acute respiratory distress Lungs: Clear to auscultation bilateral, negative wheezes or crackles  Cardiovascular: Regular rate and rhythm without murmur gallop or rub normal S1 and  S2 Abdomen: Nontender, nondistended, soft, bowel sounds positive, no rebound, no ascites, no appreciable mass Extremities: No significant cyanosis, clubbing, or edema bilateral lower extremities  Data Reviewed: Basic Metabolic Panel:  Recent Labs Lab 01/14/14 2025 01/15/14 0125 01/15/14 0544 01/15/14 1853 01/16/14 0603 01/18/14 0330 01/19/14 0758  NA 134* 136* 135* 130* 134* 135* 133*  K 3.7 4.0 4.4 5.0 5.1 4.9 5.6*  CL 92* 94* 94* 89* 93* 94* 92*  CO2 23 24 22 19 22 25 25   GLUCOSE 285* 268* 429* 625* 150* 148* 96  BUN 27* 22 26* 39* 51* 44* 71*  CREATININE 5.17* 5.66* 6.43* 7.89* 9.36* 8.08* 11.09*  CALCIUM 9.1 8.8 8.6 8.7 8.8 8.9 8.7  MG  --   --  2.0  --  2.0  --  2.3  PHOS 3.3 4.8* 6.0*  --  5.7*  --   --    Liver Function Tests:  Recent Labs Lab 01/14/14 1452 01/14/14 2025 01/15/14 0544 01/19/14 0758  AST 13  --   --  10  ALT 8  --   --  6  ALKPHOS 167*  --   --  109  BILITOT 0.4  --   --  0.4  PROT 8.4*  --   --  6.5  ALBUMIN 3.7 3.7 3.1* 2.8*   No results found for this basename: LIPASE, AMYLASE,  in the last 168 hours No results found for this basename: AMMONIA,  in the last 168 hours CBC:  Recent Labs Lab 01/14/14 1452 01/14/14 1953 01/15/14 0125 01/16/14 0603 01/19/14 0758  WBC 6.0  --  4.1 7.3 6.5  NEUTROABS 5.4  --   --   --  3.4  HGB 13.4 12.9 11.2* 11.1* 11.4*  HCT 41.6 38.0 34.0* 33.7* 36.3  MCV 102.0*  --  98.8 98.3 102.0*  PLT 169  --  115* 149* 180   Cardiac Enzymes: No results found for this basename: CKTOTAL, CKMB, CKMBINDEX, TROPONINI,  in the last 168 hours BNP (last 3 results)  Recent Labs  05/03/13 0653 06/27/13 1510 01/14/14 0216  PROBNP 33341.0* 5305.0* 17541.0*   CBG:  Recent Labs Lab 01/20/14 0910 01/20/14 1202 01/20/14 1602 01/20/14 2000 01/21/14 0149  GLUCAP 95 272* 287* 102* 217*    Recent Results (from the past 240 hour(s))  MRSA PCR SCREENING     Status: None   Collection Time    01/14/14 11:39 PM       Result Value Ref Range Status   MRSA by PCR NEGATIVE  NEGATIVE Final   Comment:            The GeneXpert MRSA Assay (FDA     approved for NASAL specimens     only), is one component of a     comprehensive MRSA colonization     surveillance program. It is not     intended to diagnose MRSA     infection nor to guide or     monitor treatment for     MRSA infections.     Studies:  Recent x-ray studies have been reviewed in detail by  the Attending Physician  Scheduled Meds:  Scheduled Meds: . aspirin EC  81 mg Oral Daily  . calcium acetate  2,001 mg Oral TID WC  . clonazePAM  1 mg Oral QHS  . [START ON 01/26/2014] darbepoetin (ARANESP) injection - DIALYSIS  60 mcg Intravenous Q Tue-HD  . doxercalciferol  4 mcg Intravenous Q T,Th,Sa-HD  . enoxaparin (LOVENOX) injection  30 mg Subcutaneous Daily  . gabapentin  100 mg Oral Daily  . insulin aspart  2-9 Units Subcutaneous TID WC  . insulin aspart  5 Units Subcutaneous Once  . insulin glargine  6 Units Subcutaneous BID  . metoCLOPramide  5 mg Oral TID AC & HS  . metoprolol succinate  75 mg Oral Q T,Th,S,Su-1800  . multivitamin  1 tablet Oral QHS  . pantoprazole  40 mg Oral QHS  . sodium chloride  3 mL Intravenous Q12H    Time spent on care of this patient: 40 mins   Allie Bossier , MD   [facility] Office  [phone] Pager [phone]  On-Call/Text Page:      [email or web address]      password [email or web address]  If 7PM-7AM, please contact night-coverage [email or web address] Password [email or web address] 01/21/2014, 8:41 AM   LOS: 7 days

## 2014-01-21 NOTE — Progress Notes (Signed)
Subjective:  Weakness, frequent severe nausea, little appetite, unstable blood sugars  Objective: Vital signs in last 24 hours: Temp:  [94.7 F (34.8 C)-98.4 F (36.9 C)] 97.6 F (36.4 C) (08/27 0915) Pulse Rate:  [71-91] 90 (08/27 0915) Resp:  [14-24] 17 (08/27 0915) BP: (128-172)/(61-83) 172/83 mmHg (08/27 0915) SpO2:  [97 %-100 %] 100 % (08/27 0915) Weight:  [75.4 kg (166 lb 3.6 oz)] 75.4 kg (166 lb 3.6 oz) (08/27 0700) Weight change: 3.1 kg (6 lb 13.4 oz)  Intake/Output from previous day: 08/26 0701 - 08/27 0700 In: 1280 [P.O.:1280] Out: 0  Intake/Output this shift:   Lab Results:  Recent Labs  01/19/14 0758  WBC 6.5  HGB 11.4*  HCT 36.3  PLT 180   BMET:  Recent Labs  01/19/14 0758  NA 133*  K 5.6*  CL 92*  CO2 25  GLUCOSE 96  BUN 71*  CREATININE 11.09*  CALCIUM 8.7  ALBUMIN 2.8*   No results found for this basename: PTH,  in the last 72 hours Iron Studies: No results found for this basename: IRON, TIBC, TRANSFERRIN, FERRITIN,  in the last 72 hours  Studies/Results: No results found.  EXAM:  General appearance: Alert, in no apparent distress  Resp: CTA without rales,rhonchi, or wheezes  Cardio: RRR without murmur or rub  GI: + BS, soft and nontender  Extremities: No edema  Access: R thigh AVG with + bruit   Dialysis prescription TTS [facility]  3 hours 45 minutes EDW 67.5 kg 2K2.5Ca Right thigh AVG 15 gu needles  Hectorol 4 mcg TIW Venofer 50 mg QThurs Aranesp 100 QWeek(Tues)  No heparin d/t issues with prolonged bleeding CBG QTMT and if BS >500 pt gives self 3 U novolog  Assessment/Plan: 1. Severe hyperglycemia/Brittle DM Type 1 -  Episodic hypoglycemia (down to 19 yesterday), most recently 447, severe nausea with poor PO intake, per primary. 2. ESRD - HD on TTS @ [facility]. HD pending today.  3. HTN/Volume - BP 172/83 on Metoprolol 100 mg qd; 8 L over EDW.  Increase goal today. 4. Anemia - Hgb 11.4 on Aranesp 100 mcg on Tues.  5. Sec HPT  - Ca 8.7 (9.9 corrected), P 5.7; Hectorol 4 mcg, Phoslo 3 with meals. Nutrition - renal carb-mod diet, multivitamin.    LOS: 7 days   LYLES,CHARLES 01/21/2014,11:57 AM  Pt seen, examined and agree w A/P as above. Will dc metoprolol altogether for now, need to get volume down.  She may need additional HD tomorrow.  Kelly Splinter MD pager [phone]    cell [phone] 01/21/2014, 2:23 PM

## 2014-01-22 DIAGNOSIS — N189 Chronic kidney disease, unspecified: Secondary | ICD-10-CM

## 2014-01-22 DIAGNOSIS — Z992 Dependence on renal dialysis: Secondary | ICD-10-CM

## 2014-01-22 DIAGNOSIS — Z9119 Patient's noncompliance with other medical treatment and regimen: Secondary | ICD-10-CM

## 2014-01-22 DIAGNOSIS — I10 Essential (primary) hypertension: Secondary | ICD-10-CM

## 2014-01-22 DIAGNOSIS — Z91199 Patient's noncompliance with other medical treatment and regimen due to unspecified reason: Secondary | ICD-10-CM

## 2014-01-22 DIAGNOSIS — E1010 Type 1 diabetes mellitus with ketoacidosis without coma: Principal | ICD-10-CM

## 2014-01-22 DIAGNOSIS — E1149 Type 2 diabetes mellitus with other diabetic neurological complication: Secondary | ICD-10-CM

## 2014-01-22 LAB — BASIC METABOLIC PANEL
Anion gap: 16 — ABNORMAL HIGH (ref 5–15)
BUN: 47 mg/dL — ABNORMAL HIGH (ref 6–23)
CHLORIDE: 91 meq/L — AB (ref 96–112)
CO2: 24 mEq/L (ref 19–32)
CREATININE: 7.8 mg/dL — AB (ref 0.50–1.10)
Calcium: 9.4 mg/dL (ref 8.4–10.5)
GFR calc non Af Amer: 6 mL/min — ABNORMAL LOW (ref >90)
GFR, EST AFRICAN AMERICAN: 7 mL/min — AB (ref >90)
Glucose, Bld: 228 mg/dL — ABNORMAL HIGH (ref 70–99)
Potassium: 6 mEq/L — ABNORMAL HIGH (ref 3.7–5.3)
SODIUM: 131 meq/L — AB (ref 137–147)

## 2014-01-22 LAB — IRON AND TIBC
Iron: 47 ug/dL (ref 42–135)
Saturation Ratios: 24 % (ref 20–55)
TIBC: 194 ug/dL — AB (ref 250–470)
UIBC: 147 ug/dL (ref 125–400)

## 2014-01-22 LAB — GLUCOSE, CAPILLARY
GLUCOSE-CAPILLARY: 206 mg/dL — AB (ref 70–99)
GLUCOSE-CAPILLARY: 277 mg/dL — AB (ref 70–99)
GLUCOSE-CAPILLARY: 167 mg/dL — AB (ref 70–99)
Glucose-Capillary: 165 mg/dL — ABNORMAL HIGH (ref 70–99)

## 2014-01-22 LAB — VITAMIN B12: Vitamin B-12: 779 pg/mL (ref 211–911)

## 2014-01-22 LAB — FERRITIN: Ferritin: 930 ng/mL — ABNORMAL HIGH (ref 10–291)

## 2014-01-22 LAB — FOLATE: Folate: 10.9 ng/mL

## 2014-01-22 MED ORDER — LIDOCAINE-PRILOCAINE 2.5-2.5 % EX CREA
1.0000 "application " | TOPICAL_CREAM | CUTANEOUS | Status: DC | PRN
  Filled 2014-01-22: qty 5

## 2014-01-22 MED ORDER — HEPARIN SODIUM (PORCINE) 1000 UNIT/ML DIALYSIS: 1000.0000 [IU] | INTRAMUSCULAR | Status: DC | PRN

## 2014-01-22 MED ORDER — ALTEPLASE 2 MG IJ SOLR
2.0000 mg | Freq: Once | INTRAMUSCULAR | Status: DC | PRN
  Filled 2014-01-22: qty 2

## 2014-01-22 MED ORDER — SODIUM CHLORIDE 0.9 % IV SOLN: 100.0000 mL | INTRAVENOUS | Status: DC | PRN

## 2014-01-22 MED ORDER — LIDOCAINE HCL (PF) 1 % IJ SOLN: 5.0000 mL | INTRAMUSCULAR | Status: DC | PRN

## 2014-01-22 MED ORDER — PENTAFLUOROPROP-TETRAFLUOROETH EX AERO: 1.0000 "application " | INHALATION_SPRAY | CUTANEOUS | Status: DC | PRN

## 2014-01-22 MED ORDER — NEPRO/CARBSTEADY PO LIQD
237.0000 mL | ORAL | Status: DC | PRN
  Filled 2014-01-22: qty 237

## 2014-01-22 MED ORDER — INSULIN GLARGINE 100 UNIT/ML SOLOSTAR PEN: 7.0000 [IU] | PEN_INJECTOR | Freq: Two times a day (BID) | SUBCUTANEOUS | Status: DC

## 2014-01-22 MED ORDER — PROMETHAZINE HCL 25 MG/ML IJ SOLN
INTRAMUSCULAR | Status: AC
  Administered 2014-01-22: 25 mg
  Filled 2014-01-22: qty 1

## 2014-01-22 MED ORDER — HEPARIN SODIUM (PORCINE) 1000 UNIT/ML DIALYSIS
1000.0000 [IU] | INTRAMUSCULAR | Status: DC | PRN
Start: 2014-01-22 — End: 2014-01-22

## 2014-01-22 NOTE — Progress Notes (Signed)
Patient discharged to home walked out with family and tech because she said she was afraid of wheelchairs

## 2014-01-22 NOTE — Discharge Summary (Signed)
Physician Discharge Summary  [name] IHK:[id number] DOB: 1970/12/11 DOA: 01/14/2014  PCP: [name], MD  Admit date: 01/14/2014 Discharge date: 01/22/2014  Time spent: 40 minutes  Recommendations for Outpatient Follow-up:  Severe Hyperglycemia - Brittle DM 1 w/ DKA  -Old patient no longer on steroids continues to have a high CBGs secondary to noncompliance with diabetic diet.  -Would discharge on Lantus to 7 units every morning, and Lantus 7 units every afternoon  -  Noncompliance with medical care  -Patient has demonstrated while in the hospital noncompliance to a diabetic or renal diet.  -Patient has also demonstrated refusal of medication to help control her electrolytes.  -Patient was counseled on 8/27 Of a sequela of continuing to not follow medical advice.  -Patient to restart her home dose of NovoLog 6 units BID with meals.  -PCP to titrate medication; NOTE patient will have a hard time achieving diabetic balance if she continues her current actions of noncompliance with medication and diet.   Hyperkalemia  -Scheduled for HD today; have spoken with Dr. Roney Jaffe (nephrology) who concurs that patient may be discharge following HD today  -Resume normal HD schedule HD T/Th/Sat,   Increased anion gap metabolic acidosis  -Resolved, but on the high end of normal   ESRD on HD s/p Failed kidney/pancreas transplant  -not on chronic immunosuppression as per review of records (transitioned off early 2015) for her combination kidney/pancreas transplant.  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  -Would discharge on no BP medication, PCP and  nephrologist to decide if or when BP medication to be restarted     Discharge Diagnoses:  Active Problems:   ESRD (end stage renal disease)   DM (diabetes mellitus), type 1 with renal complications   History of simultaneous kidney and pancreas transplant   DKA (diabetic ketoacidoses)   Hyperkalemia   Nausea and vomiting   Discharge Condition: Stable  Diet recommendation: Diabetic/renal  Filed Weights   01/21/14 1729 01/22/14 0422 01/22/14 0752  Weight: 69.4 kg (153 lb) 74.4 kg (164 lb 0.4 oz) 71.4 kg (157 lb 6.5 oz)    History of present illness:  43 y/o BF PMHx Diabetes Type 1 since childhood, S./P Kidney - Pancreas transplant 2008, ESRD on HD T/Th/Sat, Hx hypoparathyroidism, HTN, HLD, RLS, Chronic Pain Syndrome, Hx medication noncompliance.  Been admitted here multiple times recently for management of various infections (recent HCAP with septic shock requiring mechanical ventilation) and very poor glucose control. This week she has been struggling with headache, fatigue, nausea, vomiting and muscle aches. She came to the [facility] ED around midnight on 8/20 (early AM) complaining of dyspnea. She was discharged home on Levaquin and prednisone 20mg  tablets for what was felt to be bronchitis. At that point her blood sugar and potassium were normal. She took a lower dose than normal of glargine (only 3 units) because she was throwing up and could not eat. She returned to the ED later in the afternoon on 8/20 complaining of "feeling really bad". She was found to have a glucose > 800 and a potassium > 7.7. Apparently she had VTach captured on tele. She  was given insulin IV and calcium. The nephrology service saw her and arranged emergent hemodialysis.  She dialyzes TTS. Her last dialysis session was on 8/18. She had a history of non-compliance and a failed kindey and pancreas transplant. She currently denies trouble breathing. 8/28 A./O. x4, NAD, negative respiratory distress. Negative N./V.  overnight RN reported patient was consuming junk food i.e. potato chips crackers soda which I previously counseled patient not eat. Patient noncompliant.   Consultants:  Dr. Roney Jaffe (nephrology)    Procedure/Significant Events:  8/20 emergent HD  8/21 transfer to SDU     Discharge Exam: Filed Vitals:   01/21/14 2344 01/22/14 0422 01/22/14 0752 01/22/14 1118  BP:  135/65 145/56 159/78  Pulse: 92 84 81 87  Temp: 98.8 F (37.1 C) 98.3 F (36.8 C) 98.1 F (36.7 C) 97.8 F (36.6 C)  TempSrc: Oral Oral Oral Oral  Resp: 16 13 15 18   Height:      Weight:  74.4 kg (164 lb 0.4 oz) 71.4 kg (157 lb 6.5 oz)   SpO2: 100% 100% 94% 100%    General: A./O. x4, NAD, No acute respiratory distress  Lungs: Clear to auscultation bilateral, negative wheezes or crackles  Cardiovascular: Regular rate and rhythm without murmur gallop or rub normal S1 and S2  Abdomen: Nontender, nondistended, soft, bowel sounds positive, no rebound, no ascites, no appreciable mass  Extremities: No significant cyanosis, clubbing, or edema bilateral lower extremities  Discharge Instructions     Medication List    ASK your doctor about these medications       albuterol 108 (90 BASE) MCG/ACT inhaler  Commonly known as:  PROVENTIL HFA;VENTOLIN HFA  Inhale 2 puffs into the lungs every 4 (four) hours as needed for wheezing or shortness of breath (as per home regimen).     aspirin EC 81 MG tablet  Take 81 mg by mouth daily.     calcium acetate 667 MG capsule  Commonly known as:  PHOSLO  Take 2,001 mg by mouth 3 (three) times daily with meals.     clonazePAM 0.5 MG tablet  Commonly known as:  KLONOPIN  Take 1 mg by mouth at bedtime.     gabapentin 100 MG capsule  Commonly known as:  NEURONTIN  Take 100 mg by mouth daily.     Insulin Glargine 100 UNIT/ML Solostar Pen  Commonly known as:  LANTUS  Inject 7 Units into the skin 2 (two) times daily with a meal.     levofloxacin 500 MG tablet  Commonly  known as:  LEVAQUIN  Take 1 tablet (500 mg total) by mouth daily. Take 500mg  QOD beginning 01/15/14     lidocaine-prilocaine cream  Commonly known as:  EMLA  Apply 1 application topically 2 (two) times a week.     metoCLOPramide 5 MG tablet  Commonly known as:  REGLAN  Take 1 tablet (5 mg total) by mouth 4 (four) times daily -  before meals and at bedtime.     metoprolol succinate 100 MG 24 hr tablet  Commonly known as:  TOPROL-XL  Take 100 mg by mouth at bedtime. Take with or immediately following a meal.     montelukast 10 MG tablet  Commonly known as:  SINGULAIR  Take 10 mg by mouth at bedtime.     multivitamin Tabs tablet  Take 1 tablet by mouth daily.     NOVOLOG FLEXPEN 100 UNIT/ML FlexPen  Generic drug:  insulin aspart  Inject 6 Units into the  skin 2 (two) times daily with a meal.     ondansetron 4 MG disintegrating tablet  Commonly known as:  ZOFRAN-ODT  Take 1 tablet (4 mg total) by mouth every 6 (six) hours as needed for nausea or vomiting.     pantoprazole 40 MG tablet  Commonly known as:  PROTONIX  Take 1 tablet (40 mg total) by mouth at bedtime.     predniSONE 20 MG tablet  Commonly known as:  DELTASONE  Take 1 tablet (20 mg total) by mouth 2 (two) times daily.     zolpidem 12.5 MG CR tablet  Commonly known as:  AMBIEN CR  Take 12.5 mg by mouth at bedtime.       Allergies  Allergen Reactions  . Depakote [Divalproex Sodium] Other (See Comments)    Pt gets "delirious"  . Morphine And Related Nausea And Vomiting and Other (See Comments)    "makes me delirious"  . Penicillins Anaphylaxis    Tolerated Zosyn Dec 2014  . Tramadol Nausea And Vomiting  . Imitrex [Sumatriptan] Other (See Comments)    seizures  . Vicodin [Hydrocodone-Acetaminophen] Itching and Rash      The results of significant diagnostics from this hospitalization (including imaging, microbiology, ancillary and laboratory) are listed below for reference.    Significant Diagnostic  Studies: Dg Chest 2 View  01/14/2014   CLINICAL DATA:  Chest pain and shortness of Breath.  EXAM: CHEST  2 VIEW  COMPARISON:  11/27/2013.  FINDINGS: The heart is mildly enlarged but stable. The mediastinal and hilar contours are unchanged. Persistent bronchitic changes but slight overall improved aeration. No pleural effusion. The bony thorax is intact.  IMPRESSION: Stable cardiac enlargement and chronic bronchitic changes.   Electronically Signed   By: Kalman Jewels M.D.   On: 01/14/2014 01:21   Mm Digital Diagnostic Bilat  01/07/2014   CLINICAL DATA:  Delayed followup for palpable abnormalities in both breasts.  EXAM: DIGITAL DIAGNOSTIC  BILATERAL MAMMOGRAM WITH CAD  COMPARISON:  01/18/2006  ACR Breast Density Category c: The breast tissue is heterogeneously dense, which may obscure small masses.  FINDINGS: No suspicious mass, distortion, or microcalcifications are identified to suggest presence of malignancy.  Mammographic images were processed with CAD.  IMPRESSION: No mammographic evidence for malignancy.  RECOMMENDATION: Screening mammogram in one year.(Code:SM-B-01Y)  I have discussed the findings and recommendations with the patient. Results were also provided in writing at the conclusion of the visit. If applicable, a reminder letter will be sent to the patient regarding the next appointment.  BI-RADS CATEGORY  1: Negative.   Electronically Signed   By: Shon Hale M.D.   On: 01/07/2014 16:00    Microbiology: Recent Results (from the past 240 hour(s))  MRSA PCR SCREENING     Status: None   Collection Time    01/14/14 11:39 PM      Result Value Ref Range Status   MRSA by PCR NEGATIVE  NEGATIVE Final   Comment:            The GeneXpert MRSA Assay (FDA     approved for NASAL specimens     only), is one component of a     comprehensive MRSA colonization     surveillance program. It is not     intended to diagnose MRSA     infection nor to guide or     monitor treatment for     MRSA  infections.     Labs: Basic Metabolic Panel:  Recent Labs Lab 01/16/14 0603 01/18/14 0330 01/19/14 0758 01/21/14 1422 01/21/14 2031  NA 134* 135* 133* 132* 125*  K 5.1 4.9 5.6* 6.0* 7.2*  CL 93* 94* 92* 90* 87*  CO2 22 25 25 24 26   GLUCOSE 150* 148* 96 67* 555*  BUN 51* 44* 71* 73* 25*  CREATININE 9.36* 8.08* 11.09* 10.25* 5.12*  CALCIUM 8.8 8.9 8.7 9.9 9.0  MG 2.0  --  2.3  --   --   PHOS 5.7*  --   --  4.0  --    Liver Function Tests:  Recent Labs Lab 01/19/14 0758 01/21/14 1422  AST 10  --   ALT 6  --   ALKPHOS 109  --   BILITOT 0.4  --   PROT 6.5  --   ALBUMIN 2.8* 3.1*   No results found for this basename: LIPASE, AMYLASE,  in the last 168 hours No results found for this basename: AMMONIA,  in the last 168 hours CBC:  Recent Labs Lab 01/16/14 0603 01/19/14 0758 01/21/14 1422  WBC 7.3 6.5 5.8  NEUTROABS  --  3.4  --   HGB 11.1* 11.4* 11.1*  HCT 33.7* 36.3 34.6*  MCV 98.3 102.0* 101.8*  PLT 149* 180 152   Cardiac Enzymes: No results found for this basename: CKTOTAL, CKMB, CKMBINDEX, TROPONINI,  in the last 168 hours BNP: BNP (last 3 results)  Recent Labs  05/03/13 0653 06/27/13 1510 01/14/14 0216  PROBNP 33341.0* 5305.0* 17541.0*   CBG:  Recent Labs Lab 01/21/14 2036 01/21/14 2258 01/22/14 0538 01/22/14 0755 01/22/14 1120  GLUCAP 577* 344* 165* 206* 277*       Signed:  Dia Crawford, MD [facility] [phone] pager

## 2014-01-22 NOTE — Progress Notes (Signed)
[facility] Progress Note   Subjective: No complaints, no fever, sore throat, cough, diarrhea  Filed Vitals:   01/21/14 2003 01/21/14 2344 01/22/14 0422 01/22/14 0752  BP: 169/79  135/65 145/56  Pulse: 94 92 84 81  Temp: 98.5 F (36.9 C) 98.8 F (37.1 C) 98.3 F (36.8 C) 98.1 F (36.7 C)  TempSrc: Oral Oral Oral Oral  Resp: 21 16 13 15   Height:      Weight:   74.4 kg (164 lb 0.4 oz) 71.4 kg (157 lb 6.5 oz)  SpO2: 100% 100% 100% 94%   Exam: Alert, no distress, calm, ambulatory No jvd Chest clear bilat RRR no MRG Abd soft, NTND, no ascites No LE edema R thigh AVG +bruit  HD: TTS [facility] 3h 82min   67.5kg   2/2.5 Bath   R thigh AVG 15ga needles   Heparin-  none d/t prolonged bleeding issues Hect 4, Venofer 50 on thurs, Aranesp 100 q tues     Assessment: 1 Uncontrolled DM2- high and low BS's, per primary 2 ESRD on HD 3 Vol excess- still 5 kg up today 4 HTN on MTP 5 Anemia on aranesp 100/wk 6 HPTH cont vit d, binders 7 Hyperkalemia - 7.0 yest, repeat with HD today  Plan- extra HD today to get towards dry wt    Kelly Splinter MD  pager [phone]    cell [phone]  01/22/2014, 8:44 AM     Recent Labs Lab 01/16/14 0603  01/19/14 0758 01/21/14 1422 01/21/14 2031  NA 134*  < > 133* 132* 125*  K 5.1  < > 5.6* 6.0* 7.2*  CL 93*  < > 92* 90* 87*  CO2 22  < > 25 24 26   GLUCOSE 150*  < > 96 67* 555*  BUN 51*  < > 71* 73* 25*  CREATININE 9.36*  < > 11.09* 10.25* 5.12*  CALCIUM 8.8  < > 8.7 9.9 9.0  PHOS 5.7*  --   --  4.0  --   < > = values in this interval not displayed.  Recent Labs Lab 01/19/14 0758 01/21/14 1422  AST 10  --   ALT 6  --   ALKPHOS 109  --   BILITOT 0.4  --   PROT 6.5  --   ALBUMIN 2.8* 3.1*    Recent Labs Lab 01/16/14 0603 01/19/14 0758 01/21/14 1422  WBC 7.3 6.5 5.8  NEUTROABS  --  3.4  --   HGB 11.1* 11.4* 11.1*  HCT 33.7* 36.3 34.6*  MCV 98.3 102.0* 101.8*  PLT 149* 180 152   . aspirin EC  81 mg Oral Daily  .  calcium acetate  2,001 mg Oral TID WC  . clonazePAM  1 mg Oral QHS  . [START ON 01/26/2014] darbepoetin (ARANESP) injection - DIALYSIS  60 mcg Intravenous Q Tue-HD  . doxercalciferol  4 mcg Intravenous Q T,Th,Sa-HD  . enoxaparin (LOVENOX) injection  30 mg Subcutaneous Daily  . gabapentin  100 mg Oral Daily  . insulin aspart  2-9 Units Subcutaneous TID WC  . insulin glargine  6 Units Subcutaneous QPM  . insulin glargine  7 Units Subcutaneous q morning - 10a  . metoCLOPramide  5 mg Oral TID AC & HS  . multivitamin  1 tablet Oral QHS  . pantoprazole  40 mg Oral QHS  . sodium chloride  3 mL Intravenous Q12H     sodium chloride, sodium chloride, sodium chloride, acetaminophen, albuterol, dextrose, feeding supplement (NEPRO CARB STEADY),  heparin, lidocaine (PF), lidocaine-prilocaine, ondansetron, oxyCODONE, pentafluoroprop-tetrafluoroeth, promethazine, sodium chloride, zolpidem

## 2014-01-26 ENCOUNTER — Encounter (HOSPITAL_COMMUNITY): Payer: Self-pay | Admitting: Emergency Medicine

## 2014-01-26 ENCOUNTER — Emergency Department (HOSPITAL_COMMUNITY): Payer: Medicare Other

## 2014-01-26 ENCOUNTER — Non-Acute Institutional Stay (HOSPITAL_COMMUNITY)
Admission: EM | Admit: 2014-01-26 | Discharge: 2014-01-26 | Disposition: A | Discharge status: Home / Self Care | Payer: Medicare Other | Attending: Nephrology | Admitting: Nephrology

## 2014-01-26 DIAGNOSIS — I120 Hypertensive chronic kidney disease with stage 5 chronic kidney disease or end stage renal disease: Secondary | ICD-10-CM | POA: Diagnosis not present

## 2014-01-26 DIAGNOSIS — N2581 Secondary hyperparathyroidism of renal origin: Secondary | ICD-10-CM | POA: Insufficient documentation

## 2014-01-26 DIAGNOSIS — Z91158 Patient's noncompliance with renal dialysis for other reason: Secondary | ICD-10-CM | POA: Insufficient documentation

## 2014-01-26 DIAGNOSIS — E8779 Other fluid overload: Secondary | ICD-10-CM | POA: Diagnosis not present

## 2014-01-26 DIAGNOSIS — Z9115 Patient's noncompliance with renal dialysis: Secondary | ICD-10-CM | POA: Insufficient documentation

## 2014-01-26 DIAGNOSIS — D649 Anemia, unspecified: Secondary | ICD-10-CM | POA: Insufficient documentation

## 2014-01-26 DIAGNOSIS — R112 Nausea with vomiting, unspecified: Secondary | ICD-10-CM

## 2014-01-26 DIAGNOSIS — N186 End stage renal disease: Secondary | ICD-10-CM | POA: Diagnosis not present

## 2014-01-26 DIAGNOSIS — E875 Hyperkalemia: Secondary | ICD-10-CM | POA: Insufficient documentation

## 2014-01-26 DIAGNOSIS — Z992 Dependence on renal dialysis: Secondary | ICD-10-CM | POA: Insufficient documentation

## 2014-01-26 LAB — I-STAT CHEM 8, ED
BUN: 72 mg/dL — ABNORMAL HIGH (ref 6–23)
CHLORIDE: 104 meq/L (ref 96–112)
Calcium, Ion: 0.91 mmol/L — ABNORMAL LOW (ref 1.12–1.23)
Creatinine, Ser: 12.3 mg/dL — ABNORMAL HIGH (ref 0.50–1.10)
Glucose, Bld: 498 mg/dL — ABNORMAL HIGH (ref 70–99)
HEMATOCRIT: 40 % (ref 36.0–46.0)
HEMOGLOBIN: 13.6 g/dL (ref 12.0–15.0)
POTASSIUM: 6.4 meq/L — AB (ref 3.7–5.3)
Sodium: 120 mEq/L — CL (ref 137–147)
TCO2: 20 mmol/L (ref 0–100)

## 2014-01-26 LAB — CBC WITH DIFFERENTIAL/PLATELET
BASOS PCT: 0 % (ref 0–1)
Basophils Absolute: 0 10*3/uL (ref 0.0–0.1)
EOS ABS: 0 10*3/uL (ref 0.0–0.7)
Eosinophils Relative: 0 % (ref 0–5)
HCT: 35.2 % — ABNORMAL LOW (ref 36.0–46.0)
Hemoglobin: 11.2 g/dL — ABNORMAL LOW (ref 12.0–15.0)
Lymphocytes Relative: 9 % — ABNORMAL LOW (ref 12–46)
Lymphs Abs: 1 10*3/uL (ref 0.7–4.0)
MCH: 32.4 pg (ref 26.0–34.0)
MCHC: 31.8 g/dL (ref 30.0–36.0)
MCV: 101.7 fL — ABNORMAL HIGH (ref 78.0–100.0)
Monocytes Absolute: 0.4 10*3/uL (ref 0.1–1.0)
Monocytes Relative: 4 % (ref 3–12)
NEUTROS ABS: 10.3 10*3/uL — AB (ref 1.7–7.7)
Neutrophils Relative %: 87 % — ABNORMAL HIGH (ref 43–77)
PLATELETS: 201 10*3/uL (ref 150–400)
RBC: 3.46 MIL/uL — ABNORMAL LOW (ref 3.87–5.11)
RDW: 15.4 % (ref 11.5–15.5)
WBC: 11.8 10*3/uL — ABNORMAL HIGH (ref 4.0–10.5)

## 2014-01-26 NOTE — ED Notes (Signed)
Per [name], NP patient does not need an IV at this time. Pt is to go to dialysis around 7 am this morning.

## 2014-01-26 NOTE — Procedures (Signed)
I was present at this session.  I have reviewed the session itself and made appropriate changes.  HD missed on Sat. ; vol xs.  To home post tx  [name] L 9/1/20159:36 AM

## 2014-01-26 NOTE — ED Notes (Signed)
IV team called for IV start.

## 2014-01-26 NOTE — ED Notes (Signed)
Patient presents to ED via POV from home. Pt c/o feeling "short of breath" onset Monday morning. Patient was wearing personal O2 via [place] upon arrival but the tank was empty. Pt is a dialysis patient was that just discharged on Friday. Last dialysis was on Friday morning. A&Ox4

## 2014-01-26 NOTE — Progress Notes (Signed)
Patient received hemodialysis.  4.5kg removed. Vital signs 114/65, 88 bpm, sat of 95% on room air. Patient has no verbalized concerns. Patient states that she feels much better. Patient discharged home per Dr Deterding.

## 2014-01-26 NOTE — Progress Notes (Signed)
Subjective:   Discharged 8/28, but returned this morning with dyspnea requiring dialysis, seen on dialysis, feeling much better. Missed TX  Objective: Vital signs in last 24 hours: Temp:  [98.3 F (36.8 C)] 98.3 F (36.8 C) (09/01 0655) Pulse Rate:  [79-96] 88 (09/01 0900) Resp:  [14-25] 14 (09/01 0900) BP: (107-176)/(62-79) 111/69 mmHg (09/01 0900) SpO2:  [92 %-100 %] 100 % (09/01 0800) Weight:  [75.3 kg (166 lb 0.1 oz)] 75.3 kg (166 lb 0.1 oz) (09/01 0655) Weight change:   Intake/Output from previous day:   Intake/Output this shift:   Lab Results:  Recent Labs  01/26/14 0400 01/26/14 0422  WBC 11.8*  --   HGB 11.2* 13.6  HCT 35.2* 40.0  PLT 201  --    BMET:  Recent Labs  01/26/14 0422  NA 120*  K 6.4*  CL 104  GLUCOSE 498*  BUN 72*  CREATININE 12.30*   No results found for this basename: PTH,  in the last 72 hours Iron Studies: No results found for this basename: IRON, TIBC, TRANSFERRIN, FERRITIN,  in the last 72 hours  Studies/Results: No results found.  EXAM:  General appearance:  Sleepy, but in no apparent distress Resp:  Mild expiratory wheezes rales in bases Cardio: RRR without murmur or rub gr2/6 holsys M GI: + BS, soft and nontender obese Extremities: No edema  Access: R thigh AVG with BFR 400 cc/min  Dialysis prescription TTS [facility]  3 hours 45 minutes EDW 67.5 kg 2K2.5Ca Right thigh AVG 15 gu needles  Hectorol 4 mcg TIW Venofer 50 mg QThurs Aranesp 100 QWeek(Tues)  No heparin d/t issues with prolonged bleeding CBG QTMT and if BS >500 pt gives self 3 U novolog  Assessment/Plan: 1. Dyspnea - sec to fluid overload, pulmonary edema per CXR. 2. Hyperkalemia - K 6.4 this morning, last HD 8/28 inpatient. 3. ESRD - HD on TTS @ [facility].  Last HD 8/28, did not go to outpatient HD on 8/29.. 4. Severe hyperglycemia/Brittle DM Type 1 - admission 8/20-28. 5. HTN/Volume - BP 111/69 on Metoprolol 100 mg qd; 8 L over EDW pre-HD. 6. Anemia - Hgb  11.2 on Aranesp 100 mcg on Tues.  7. Sec HPT - Ca 8.7 (9.9 corrected), P 5.7; Hectorol 4 mcg, Phoslo 3 with meals.  8. Nutrition - renal carb-mod diet, multivitamin. 9. Nonadherence, reason for admit     LOS: 0 days   LYLES,CHARLES 01/26/2014,9:28 AM I have seen and examined this patient and agree with the plan of care  Seen,examined and eval.  See above .  Kiaria Quinnell L 01/26/2014, 11:13 AM

## 2014-03-28 DIAGNOSIS — I639 Cerebral infarction, unspecified: Secondary | ICD-10-CM

## 2014-03-28 HISTORY — DX: Cerebral infarction, unspecified: I63.9

## 2014-04-05 IMAGING — CR DG ELBOW 2V*L*
1 series · 1 of 1 positions shown · non-contrast
Comparison: None

CLINICAL DATA: Incorrect needle count

LEFT ELBOW - 2 VIEW

[AP]
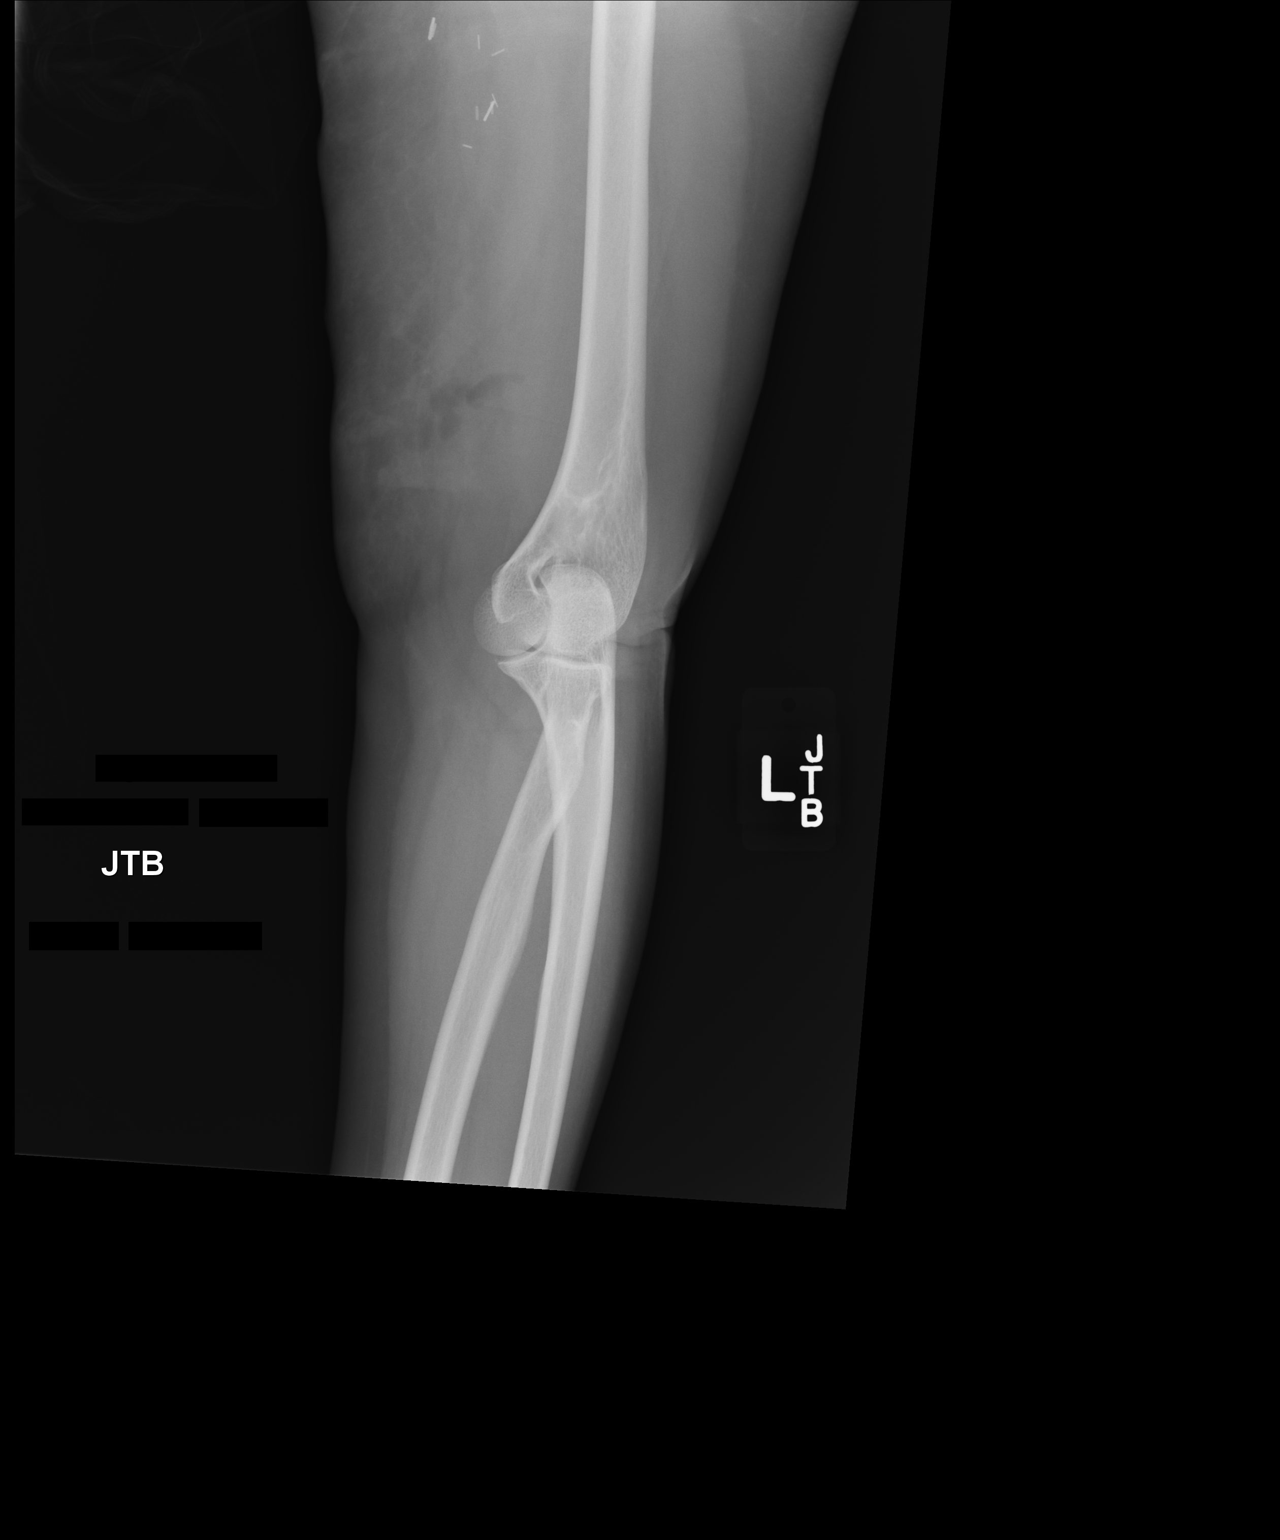

[1 of 1 positions shown; findings below may reference images not displayed]

FINDINGS: Multiple surgical clips project over the soft tissues
anterior to the humeral shaft.  Subcutaneous gas is not unexpected.
Otherwise, no radiopaque foreign body identified. No acute osseous
finding.
IMPRESSION: Multiple surgical clips.  Otherwise, no radiopaque foreign body
identified.

## 2014-04-05 IMAGING — CR DG HIP 1V PORT*R*
1 series · 1 of 1 positions shown · non-contrast
Comparison: None

CLINICAL DATA: Incorrect needle count.

PORTABLE RIGHT HIP - 1 VIEW

[AP]
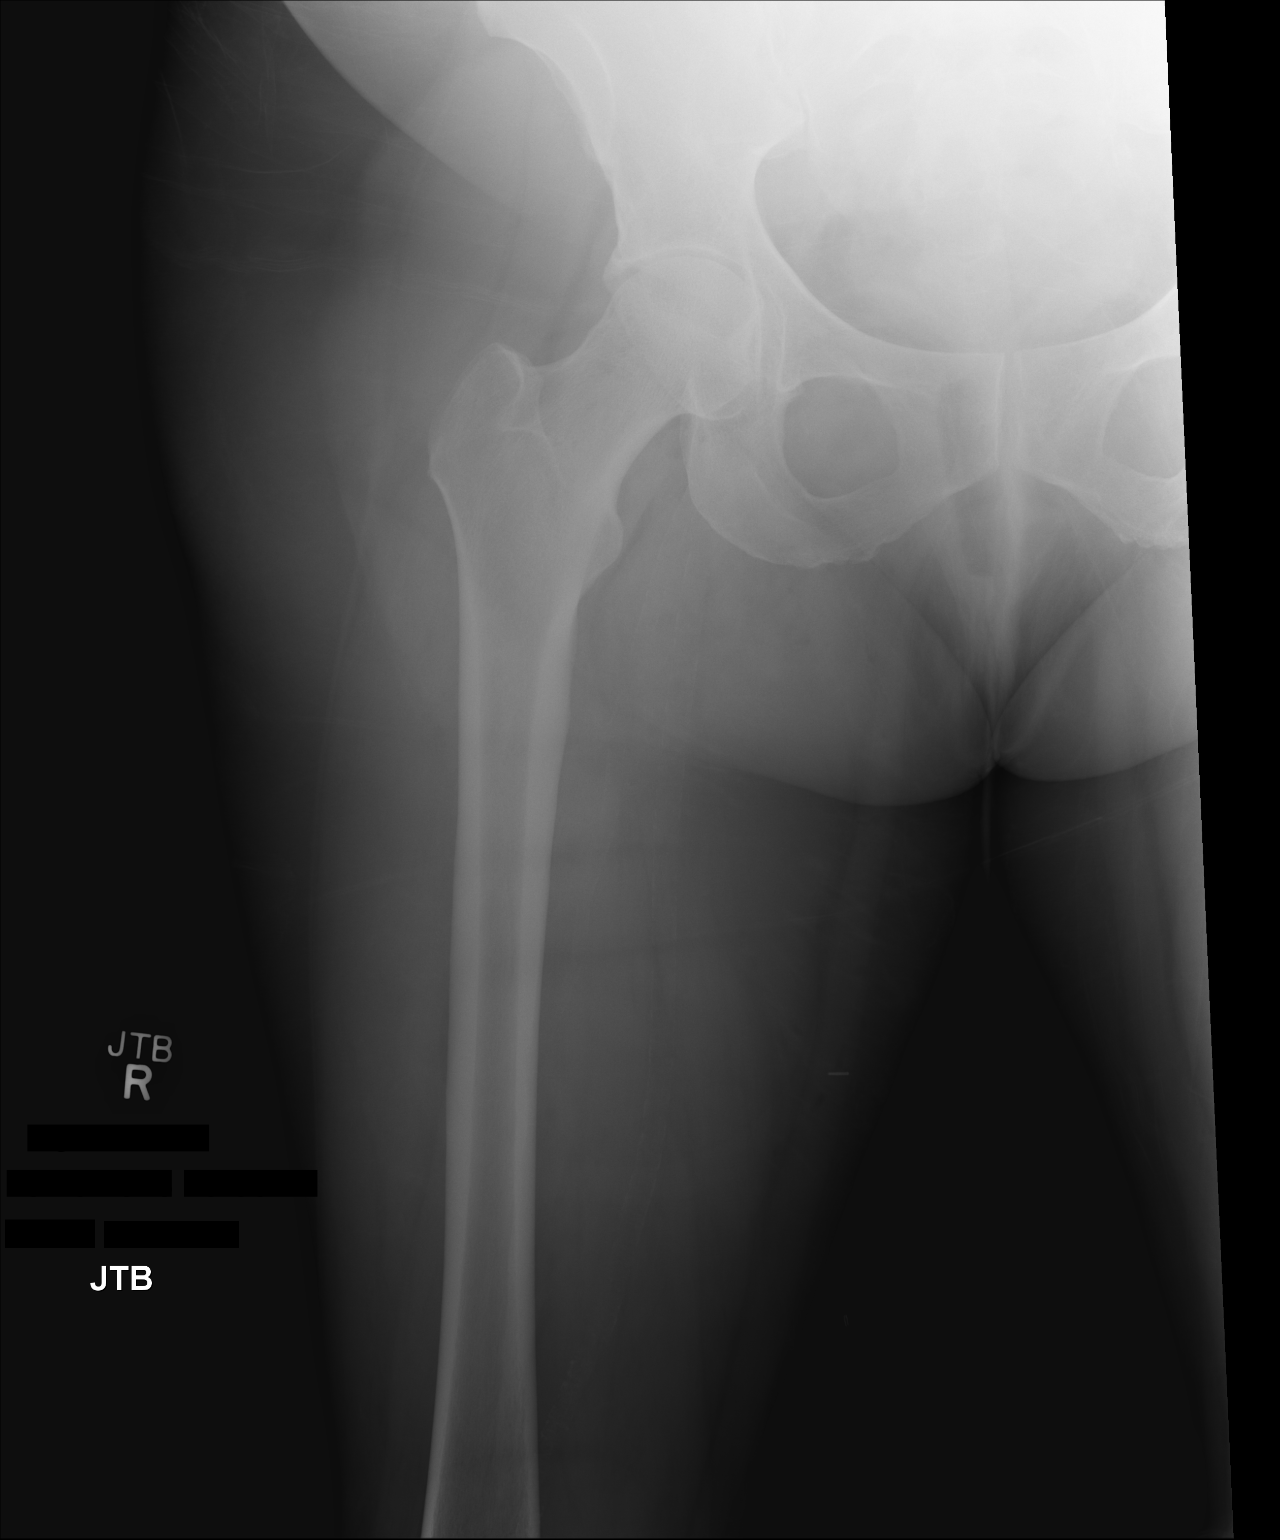

[1 of 1 positions shown; findings below may reference images not displayed]

FINDINGS: Mild right hip degenerative change.  Linear metallic
density projecting over the medial thigh is favored to reflect a
surgical clip.  Otherwise, no radiopaque foreign body is
identified.  No aggressive osseous lesion.}
IMPRESSION: Linear metallic density projecting over the medial thigh is favored
to be a surgical clip.[ Otherwise, no radiopaque foreign body
identified.]

## 2014-04-13 ENCOUNTER — Inpatient Hospital Stay (HOSPITAL_COMMUNITY)
Admission: EM | Admit: 2014-04-13 | Discharge: 2014-04-23 | DRG: 981 | Disposition: A | Discharge status: Home / Self Care | Payer: Medicare Other | Attending: Internal Medicine | Admitting: Internal Medicine

## 2014-04-13 ENCOUNTER — Emergency Department (HOSPITAL_COMMUNITY): Payer: Medicare Other

## 2014-04-13 ENCOUNTER — Encounter (HOSPITAL_COMMUNITY): Payer: Self-pay | Admitting: *Deleted

## 2014-04-13 DIAGNOSIS — I6302 Cerebral infarction due to thrombosis of basilar artery: Secondary | ICD-10-CM | POA: Insufficient documentation

## 2014-04-13 DIAGNOSIS — J9601 Acute respiratory failure with hypoxia: Secondary | ICD-10-CM | POA: Diagnosis present

## 2014-04-13 DIAGNOSIS — Z885 Allergy status to narcotic agent status: Secondary | ICD-10-CM

## 2014-04-13 DIAGNOSIS — E162 Hypoglycemia, unspecified: Secondary | ICD-10-CM | POA: Diagnosis present

## 2014-04-13 DIAGNOSIS — Z888 Allergy status to other drugs, medicaments and biological substances status: Secondary | ICD-10-CM

## 2014-04-13 DIAGNOSIS — E785 Hyperlipidemia, unspecified: Secondary | ICD-10-CM | POA: Diagnosis present

## 2014-04-13 DIAGNOSIS — Z880 Allergy status to penicillin: Secondary | ICD-10-CM | POA: Diagnosis not present

## 2014-04-13 DIAGNOSIS — E10319 Type 1 diabetes mellitus with unspecified diabetic retinopathy without macular edema: Secondary | ICD-10-CM | POA: Diagnosis present

## 2014-04-13 DIAGNOSIS — Z9981 Dependence on supplemental oxygen: Secondary | ICD-10-CM

## 2014-04-13 DIAGNOSIS — F329 Major depressive disorder, single episode, unspecified: Secondary | ICD-10-CM | POA: Diagnosis present

## 2014-04-13 DIAGNOSIS — Z992 Dependence on renal dialysis: Secondary | ICD-10-CM

## 2014-04-13 DIAGNOSIS — Z9119 Patient's noncompliance with other medical treatment and regimen: Secondary | ICD-10-CM | POA: Diagnosis present

## 2014-04-13 DIAGNOSIS — I120 Hypertensive chronic kidney disease with stage 5 chronic kidney disease or end stage renal disease: Secondary | ICD-10-CM | POA: Diagnosis present

## 2014-04-13 DIAGNOSIS — N2581 Secondary hyperparathyroidism of renal origin: Secondary | ICD-10-CM | POA: Diagnosis present

## 2014-04-13 DIAGNOSIS — R05 Cough: Secondary | ICD-10-CM | POA: Diagnosis present

## 2014-04-13 DIAGNOSIS — D649 Anemia, unspecified: Secondary | ICD-10-CM | POA: Diagnosis present

## 2014-04-13 DIAGNOSIS — I6350 Cerebral infarction due to unspecified occlusion or stenosis of unspecified cerebral artery: Secondary | ICD-10-CM | POA: Diagnosis present

## 2014-04-13 DIAGNOSIS — E669 Obesity, unspecified: Secondary | ICD-10-CM | POA: Diagnosis present

## 2014-04-13 DIAGNOSIS — E875 Hyperkalemia: Secondary | ICD-10-CM | POA: Diagnosis present

## 2014-04-13 DIAGNOSIS — Y830 Surgical operation with transplant of whole organ as the cause of abnormal reaction of the patient, or of later complication, without mention of misadventure at the time of the procedure: Secondary | ICD-10-CM | POA: Diagnosis present

## 2014-04-13 DIAGNOSIS — G2581 Restless legs syndrome: Secondary | ICD-10-CM | POA: Diagnosis present

## 2014-04-13 DIAGNOSIS — Z7982 Long term (current) use of aspirin: Secondary | ICD-10-CM | POA: Diagnosis not present

## 2014-04-13 DIAGNOSIS — T8612 Kidney transplant failure: Secondary | ICD-10-CM | POA: Diagnosis present

## 2014-04-13 DIAGNOSIS — Z6830 Body mass index (BMI) 30.0-30.9, adult: Secondary | ICD-10-CM

## 2014-04-13 DIAGNOSIS — N186 End stage renal disease: Secondary | ICD-10-CM | POA: Diagnosis present

## 2014-04-13 DIAGNOSIS — K219 Gastro-esophageal reflux disease without esophagitis: Secondary | ICD-10-CM | POA: Diagnosis present

## 2014-04-13 DIAGNOSIS — R200 Anesthesia of skin: Secondary | ICD-10-CM

## 2014-04-13 DIAGNOSIS — J811 Chronic pulmonary edema: Secondary | ICD-10-CM | POA: Diagnosis present

## 2014-04-13 DIAGNOSIS — E1010 Type 1 diabetes mellitus with ketoacidosis without coma: Secondary | ICD-10-CM | POA: Diagnosis present

## 2014-04-13 DIAGNOSIS — M7989 Other specified soft tissue disorders: Secondary | ICD-10-CM

## 2014-04-13 DIAGNOSIS — J45909 Unspecified asthma, uncomplicated: Secondary | ICD-10-CM | POA: Diagnosis present

## 2014-04-13 DIAGNOSIS — E8770 Fluid overload, unspecified: Secondary | ICD-10-CM | POA: Diagnosis present

## 2014-04-13 DIAGNOSIS — I739 Peripheral vascular disease, unspecified: Secondary | ICD-10-CM | POA: Diagnosis present

## 2014-04-13 DIAGNOSIS — R059 Cough, unspecified: Secondary | ICD-10-CM | POA: Insufficient documentation

## 2014-04-13 DIAGNOSIS — J189 Pneumonia, unspecified organism: Secondary | ICD-10-CM | POA: Diagnosis present

## 2014-04-13 DIAGNOSIS — I639 Cerebral infarction, unspecified: Secondary | ICD-10-CM | POA: Diagnosis present

## 2014-04-13 DIAGNOSIS — I878 Other specified disorders of veins: Secondary | ICD-10-CM | POA: Insufficient documentation

## 2014-04-13 DIAGNOSIS — K3184 Gastroparesis: Secondary | ICD-10-CM | POA: Diagnosis present

## 2014-04-13 DIAGNOSIS — G43709 Chronic migraine without aura, not intractable, without status migrainosus: Secondary | ICD-10-CM

## 2014-04-13 DIAGNOSIS — Z794 Long term (current) use of insulin: Secondary | ICD-10-CM | POA: Diagnosis not present

## 2014-04-13 DIAGNOSIS — J439 Emphysema, unspecified: Secondary | ICD-10-CM | POA: Diagnosis present

## 2014-04-13 DIAGNOSIS — T86891 Other transplanted tissue failure: Secondary | ICD-10-CM | POA: Diagnosis present

## 2014-04-13 DIAGNOSIS — Z79899 Other long term (current) drug therapy: Secondary | ICD-10-CM | POA: Diagnosis not present

## 2014-04-13 DIAGNOSIS — IMO0002 Reserved for concepts with insufficient information to code with codable children: Secondary | ICD-10-CM | POA: Insufficient documentation

## 2014-04-13 DIAGNOSIS — I651 Occlusion and stenosis of basilar artery: Secondary | ICD-10-CM | POA: Diagnosis present

## 2014-04-13 DIAGNOSIS — E1110 Type 2 diabetes mellitus with ketoacidosis without coma: Secondary | ICD-10-CM | POA: Diagnosis present

## 2014-04-13 DIAGNOSIS — R202 Paresthesia of skin: Secondary | ICD-10-CM

## 2014-04-13 DIAGNOSIS — M79604 Pain in right leg: Secondary | ICD-10-CM | POA: Insufficient documentation

## 2014-04-13 DIAGNOSIS — E0810 Diabetes mellitus due to underlying condition with ketoacidosis without coma: Secondary | ICD-10-CM

## 2014-04-13 LAB — CBC WITH DIFFERENTIAL/PLATELET
Basophils Absolute: 0 10*3/uL (ref 0.0–0.1)
Basophils Relative: 0 % (ref 0–1)
EOS PCT: 0 % (ref 0–5)
Eosinophils Absolute: 0 10*3/uL (ref 0.0–0.7)
HCT: 32.7 % — ABNORMAL LOW (ref 36.0–46.0)
Hemoglobin: 10.3 g/dL — ABNORMAL LOW (ref 12.0–15.0)
LYMPHS PCT: 9 % — AB (ref 12–46)
Lymphs Abs: 0.8 10*3/uL (ref 0.7–4.0)
MCH: 31.6 pg (ref 26.0–34.0)
MCHC: 31.5 g/dL (ref 30.0–36.0)
MCV: 100.3 fL — AB (ref 78.0–100.0)
MONO ABS: 0.2 10*3/uL (ref 0.1–1.0)
Monocytes Relative: 2 % — ABNORMAL LOW (ref 3–12)
Neutro Abs: 8.3 10*3/uL — ABNORMAL HIGH (ref 1.7–7.7)
Neutrophils Relative %: 89 % — ABNORMAL HIGH (ref 43–77)
Platelets: 169 10*3/uL (ref 150–400)
RBC: 3.26 MIL/uL — AB (ref 3.87–5.11)
RDW: 14.4 % (ref 11.5–15.5)
WBC: 9.4 10*3/uL (ref 4.0–10.5)

## 2014-04-13 LAB — COMPREHENSIVE METABOLIC PANEL
ALT: 8 U/L (ref 0–35)
AST: 13 U/L (ref 0–37)
Albumin: 3.5 g/dL (ref 3.5–5.2)
Alkaline Phosphatase: 115 U/L (ref 39–117)
Anion gap: 28 — ABNORMAL HIGH (ref 5–15)
BILIRUBIN TOTAL: 0.7 mg/dL (ref 0.3–1.2)
BUN: 77 mg/dL — ABNORMAL HIGH (ref 6–23)
CALCIUM: 8.4 mg/dL (ref 8.4–10.5)
CHLORIDE: 85 meq/L — AB (ref 96–112)
CO2: 14 meq/L — AB (ref 19–32)
CREATININE: 14.79 mg/dL — AB (ref 0.50–1.10)
GFR calc non Af Amer: 3 mL/min — ABNORMAL LOW (ref >90)
GFR, EST AFRICAN AMERICAN: 3 mL/min — AB (ref >90)
GLUCOSE: 791 mg/dL — AB (ref 70–99)
Potassium: 6.8 mEq/L (ref 3.7–5.3)
Sodium: 127 mEq/L — ABNORMAL LOW (ref 137–147)
Total Protein: 7.9 g/dL (ref 6.0–8.3)

## 2014-04-13 LAB — I-STAT CHEM 8, ED
BUN: 77 mg/dL — AB (ref 6–23)
CALCIUM ION: 1.02 mmol/L — AB (ref 1.12–1.23)
Chloride: 98 mEq/L (ref 96–112)
Creatinine, Ser: 16 mg/dL — ABNORMAL HIGH (ref 0.50–1.10)
HCT: 37 % (ref 36.0–46.0)
Hemoglobin: 12.6 g/dL (ref 12.0–15.0)
Potassium: 6.7 mEq/L (ref 3.7–5.3)
Sodium: 126 mEq/L — ABNORMAL LOW (ref 137–147)
TCO2: 17 mmol/L (ref 0–100)

## 2014-04-13 LAB — CBC
HCT: 30.8 % — ABNORMAL LOW (ref 36.0–46.0)
Hemoglobin: 9.9 g/dL — ABNORMAL LOW (ref 12.0–15.0)
MCH: 31 pg (ref 26.0–34.0)
MCHC: 32.1 g/dL (ref 30.0–36.0)
MCV: 96.6 fL (ref 78.0–100.0)
Platelets: 153 10*3/uL (ref 150–400)
RBC: 3.19 MIL/uL — ABNORMAL LOW (ref 3.87–5.11)
RDW: 14.2 % (ref 11.5–15.5)
WBC: 9.8 10*3/uL (ref 4.0–10.5)

## 2014-04-13 LAB — I-STAT TROPONIN, ED: Troponin i, poc: 0.01 ng/mL (ref 0.00–0.08)

## 2014-04-13 LAB — BASIC METABOLIC PANEL
ANION GAP: 30 — AB (ref 5–15)
BUN: 21 mg/dL (ref 6–23)
CALCIUM: 8.9 mg/dL (ref 8.4–10.5)
CO2: 15 mEq/L — ABNORMAL LOW (ref 19–32)
CREATININE: 5.83 mg/dL — AB (ref 0.50–1.10)
Chloride: 88 mEq/L — ABNORMAL LOW (ref 96–112)
GFR calc Af Amer: 9 mL/min — ABNORMAL LOW (ref >90)
GFR, EST NON AFRICAN AMERICAN: 8 mL/min — AB (ref >90)
Glucose, Bld: 355 mg/dL — ABNORMAL HIGH (ref 70–99)
Potassium: 4.3 mEq/L (ref 3.7–5.3)
Sodium: 133 mEq/L — ABNORMAL LOW (ref 137–147)

## 2014-04-13 LAB — GLUCOSE, CAPILLARY
GLUCOSE-CAPILLARY: 301 mg/dL — AB (ref 70–99)
Glucose-Capillary: 385 mg/dL — ABNORMAL HIGH (ref 70–99)

## 2014-04-13 LAB — CBG MONITORING, ED: Glucose-Capillary: >600 mg/dL (ref 70–99)

## 2014-04-13 LAB — MRSA PCR SCREENING: MRSA BY PCR: NEGATIVE

## 2014-04-13 LAB — I-STAT CG4 LACTIC ACID, ED: Lactic Acid, Venous: 1.58 mmol/L (ref 0.5–2.2)

## 2014-04-13 LAB — HEPATITIS B SURFACE ANTIGEN: HEP B S AG: NEGATIVE

## 2014-04-13 MED ORDER — PIPERACILLIN-TAZOBACTAM IN DEX 2-0.25 GM/50ML IV SOLN
2.2500 g | Freq: Three times a day (TID) | INTRAVENOUS | Status: DC
  Administered 2014-04-13: 2.25 g via INTRAVENOUS
  Filled 2014-04-13 (×5): qty 50

## 2014-04-13 MED ORDER — ACETAMINOPHEN 325 MG PO TABS
650.0000 mg | ORAL_TABLET | Freq: Four times a day (QID) | ORAL | Status: DC | PRN
  Administered 2014-04-13 – 2014-04-21 (×3): 650 mg via ORAL
  Filled 2014-04-13 (×3): qty 2

## 2014-04-13 MED ORDER — PIPERACILLIN-TAZOBACTAM IN DEX 2-0.25 GM/50ML IV SOLN
2.2500 g | Freq: Three times a day (TID) | INTRAVENOUS | Status: DC
  Administered 2014-04-13 – 2014-04-14 (×3): 2.25 g via INTRAVENOUS
  Filled 2014-04-13 (×5): qty 50

## 2014-04-13 MED ORDER — LIDOCAINE HCL (PF) 1 % IJ SOLN: 5.0000 mL | INTRAMUSCULAR | Status: DC | PRN

## 2014-04-13 MED ORDER — CLONAZEPAM 0.5 MG PO TABS
0.5000 mg | ORAL_TABLET | Freq: Every evening | ORAL | Status: DC | PRN
  Administered 2014-04-15 – 2014-04-23 (×6): 0.5 mg via ORAL
  Filled 2014-04-13 (×7): qty 1

## 2014-04-13 MED ORDER — DOXERCALCIFEROL 4 MCG/2ML IV SOLN
INTRAVENOUS | Status: AC
  Filled 2014-04-13: qty 2

## 2014-04-13 MED ORDER — SODIUM CHLORIDE 0.9 % IV SOLN: 100.0000 mL | INTRAVENOUS | Status: DC | PRN

## 2014-04-13 MED ORDER — HEPARIN SODIUM (PORCINE) 5000 UNIT/ML IJ SOLN
5000.0000 [IU] | Freq: Three times a day (TID) | INTRAMUSCULAR | Status: DC
  Administered 2014-04-13 – 2014-04-23 (×5): 5000 [IU] via SUBCUTANEOUS
  Filled 2014-04-13 (×32): qty 1

## 2014-04-13 MED ORDER — ONDANSETRON 4 MG PO TBDP
4.0000 mg | ORAL_TABLET | Freq: Four times a day (QID) | ORAL | Status: DC | PRN
  Administered 2014-04-14: 4 mg via ORAL
  Filled 2014-04-13 (×3): qty 1

## 2014-04-13 MED ORDER — SODIUM CHLORIDE 0.9 % IJ SOLN
3.0000 mL | Freq: Two times a day (BID) | INTRAMUSCULAR | Status: DC
  Administered 2014-04-13 – 2014-04-19 (×5): 3 mL via INTRAVENOUS

## 2014-04-13 MED ORDER — ACETAMINOPHEN 650 MG RE SUPP
650.0000 mg | Freq: Four times a day (QID) | RECTAL | Status: DC | PRN
  Filled 2014-04-13: qty 1

## 2014-04-13 MED ORDER — ALBUTEROL SULFATE (2.5 MG/3ML) 0.083% IN NEBU: 2.5000 mg | INHALATION_SOLUTION | RESPIRATORY_TRACT | Status: DC | PRN

## 2014-04-13 MED ORDER — RENA-VITE PO TABS
1.0000 | ORAL_TABLET | Freq: Every day | ORAL | Status: DC
  Administered 2014-04-13 – 2014-04-23 (×11): 1 via ORAL
  Filled 2014-04-13 (×12): qty 1

## 2014-04-13 MED ORDER — HEPARIN SODIUM (PORCINE) 1000 UNIT/ML DIALYSIS
1000.0000 [IU] | INTRAMUSCULAR | Status: DC | PRN
  Filled 2014-04-13: qty 1

## 2014-04-13 MED ORDER — PANTOPRAZOLE SODIUM 40 MG PO TBEC
40.0000 mg | DELAYED_RELEASE_TABLET | Freq: Every day | ORAL | Status: DC
  Administered 2014-04-13 – 2014-04-22 (×10): 40 mg via ORAL
  Filled 2014-04-13 (×10): qty 1

## 2014-04-13 MED ORDER — MONTELUKAST SODIUM 10 MG PO TABS
10.0000 mg | ORAL_TABLET | Freq: Every day | ORAL | Status: DC
  Administered 2014-04-13 – 2014-04-22 (×10): 10 mg via ORAL
  Filled 2014-04-13 (×11): qty 1

## 2014-04-13 MED ORDER — SODIUM CHLORIDE 0.9 % IV SOLN
62.5000 mg | INTRAVENOUS | Status: DC
  Administered 2014-04-15 – 2014-04-21 (×2): 62.5 mg via INTRAVENOUS
  Filled 2014-04-13 (×3): qty 5

## 2014-04-13 MED ORDER — GABAPENTIN 100 MG PO CAPS
100.0000 mg | ORAL_CAPSULE | Freq: Every day | ORAL | Status: DC
  Administered 2014-04-13 – 2014-04-23 (×11): 100 mg via ORAL
  Filled 2014-04-13 (×11): qty 1

## 2014-04-13 MED ORDER — DOXERCALCIFEROL 4 MCG/2ML IV SOLN
2.0000 ug | INTRAVENOUS | Status: DC
  Administered 2014-04-13 – 2014-04-21 (×4): 2 ug via INTRAVENOUS
  Filled 2014-04-13 (×3): qty 2

## 2014-04-13 MED ORDER — DIPHENHYDRAMINE HCL 50 MG/ML IJ SOLN
25.0000 mg | Freq: Once | INTRAMUSCULAR | Status: AC
  Administered 2014-04-13: 25 mg via INTRAVENOUS
  Filled 2014-04-13: qty 1

## 2014-04-13 MED ORDER — PENTAFLUOROPROP-TETRAFLUOROETH EX AERO: 1.0000 "application " | INHALATION_SPRAY | CUTANEOUS | Status: DC | PRN

## 2014-04-13 MED ORDER — ASPIRIN EC 81 MG PO TBEC
81.0000 mg | DELAYED_RELEASE_TABLET | Freq: Every day | ORAL | Status: DC
  Administered 2014-04-13 – 2014-04-17 (×5): 81 mg via ORAL
  Filled 2014-04-13 (×6): qty 1

## 2014-04-13 MED ORDER — METOCLOPRAMIDE HCL 5 MG/ML IJ SOLN
10.0000 mg | Freq: Once | INTRAMUSCULAR | Status: AC
  Administered 2014-04-13: 10 mg via INTRAVENOUS
  Filled 2014-04-13: qty 2

## 2014-04-13 MED ORDER — NEPRO/CARBSTEADY PO LIQD
237.0000 mL | ORAL | Status: DC | PRN
  Filled 2014-04-13: qty 237

## 2014-04-13 MED ORDER — IPRATROPIUM-ALBUTEROL 0.5-2.5 (3) MG/3ML IN SOLN
3.0000 mL | Freq: Once | RESPIRATORY_TRACT | Status: AC
  Administered 2014-04-13: 3 mL via RESPIRATORY_TRACT
  Filled 2014-04-13: qty 3

## 2014-04-13 MED ORDER — DARBEPOETIN ALFA 25 MCG/0.42ML IJ SOSY
25.0000 ug | PREFILLED_SYRINGE | INTRAMUSCULAR | Status: DC
  Filled 2014-04-13: qty 0.42

## 2014-04-13 MED ORDER — SODIUM CHLORIDE 0.9 % IV SOLN
INTRAVENOUS | Status: DC
  Administered 2014-04-13: 3.3 [IU]/h via INTRAVENOUS
  Filled 2014-04-13: qty 2.5

## 2014-04-13 MED ORDER — ACETAMINOPHEN 325 MG PO TABS
650.0000 mg | ORAL_TABLET | Freq: Once | ORAL | Status: AC
  Administered 2014-04-13: 650 mg via ORAL
  Filled 2014-04-13: qty 2

## 2014-04-13 MED ORDER — VANCOMYCIN HCL 10 G IV SOLR
1500.0000 mg | Freq: Once | INTRAVENOUS | Status: AC
  Administered 2014-04-13: 1500 mg via INTRAVENOUS
  Filled 2014-04-13: qty 1500

## 2014-04-13 MED ORDER — CALCIUM ACETATE 667 MG PO CAPS
2001.0000 mg | ORAL_CAPSULE | Freq: Three times a day (TID) | ORAL | Status: DC
  Administered 2014-04-13 – 2014-04-21 (×19): 2001 mg via ORAL
  Filled 2014-04-13 (×29): qty 3

## 2014-04-13 MED ORDER — VANCOMYCIN HCL IN DEXTROSE 750-5 MG/150ML-% IV SOLN
750.0000 mg | INTRAVENOUS | Status: DC
  Administered 2014-04-13: 750 mg via INTRAVENOUS
  Filled 2014-04-13: qty 150

## 2014-04-13 MED ORDER — ALTEPLASE 2 MG IJ SOLR
2.0000 mg | Freq: Once | INTRAMUSCULAR | Status: AC | PRN
  Filled 2014-04-13: qty 2

## 2014-04-13 MED ORDER — LIDOCAINE-PRILOCAINE 2.5-2.5 % EX CREA
1.0000 "application " | TOPICAL_CREAM | CUTANEOUS | Status: DC | PRN
  Filled 2014-04-13: qty 5

## 2014-04-13 MED ORDER — LIDOCAINE-PRILOCAINE 2.5-2.5 % EX CREA
1.0000 | TOPICAL_CREAM | CUTANEOUS | Status: DC
Start: 2014-04-15 — End: 2014-04-13

## 2014-04-13 NOTE — Progress Notes (Signed)
Pt just came to floor. Pt is alert and oriented x 4. Pt came from hemodialysis. Pt states no pain or complaints. Central telemetry notified. [place] notified. Pt is a new admit from ed to hemodialysis to here. Will monitor.

## 2014-04-13 NOTE — H&P (Signed)
Patient Demographics  [name], is a 43 y.o. female  MRN: [id number]   DOB - [birth year]  Admit Date - 04/13/2014  Outpatient Primary MD for the patient is [name], [name] Shave, MD   With History of -  Past Medical History  Diagnosis Date  . Hyperlipidemia   . Alopecia   . Obesity   . Iron deficiency   . ESRD (end stage renal disease)     S/p pancreatic and kidney transplant, but back on HD 2008, dialysis at [facility], [place], Sat  . RLS (restless legs syndrome)   . Injury of conjunctiva and corneal abrasion of right eye without foreign body   . Cellulitis   . Anemia   . Blood transfusion 2004    "when I had my transplant"  . Migraine   . Hyperparathyroidism, secondary   . Diabetic retinopathy   . Hypertension     takes Metoprolol and Exforge daily  . Depression     takes Klonopin nightly  . Diabetes mellitus type 1     Pt states diagnosed at age 66 with prior episodes of DKA. S/p pancreatic transplant   . GERD (gastroesophageal reflux disease)     takes Protonix daily  . Bronchitis   . Migraine   . Asthma   . Emphysema of lung   . Angina     none in past 2 years.  . Stomach pain     Hx: of chronic  . Pneumonia 2012    "double"  . Dialysis patient     tues,thurs,sat  . 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  Surgery was done at [facility].         Past Surgical History  Procedure Laterality Date  . 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 Thyroglobulin      x 7  . Av fistula placement      right upper arm  . Cholecystectomy  1995  .  hd graft placement/removal      "had 2 in my left upper arm"  . Eye surgery    . Retinopathy surgery    . Tooth extraction  10/10/11    X's two  . Insertion of  dialysis catheter  12/08/2011    Procedure: INSERTION OF DIALYSIS CATHETER;  Surgeon: Angelia Mould, MD;  Location: [place];  Service: Vascular;  Laterality: Left;  . Av fistula placement  01/22/2012    Procedure: INSERTION OF ARTERIOVENOUS (AV) GORE-TEX GRAFT ARM;  Surgeon: Angelia Mould, MD;  Location: [place];  Service: Vascular;  Laterality: Left;  Ultrasound guided.  . Av fistula placement  03/14/2012    Procedure: INSERTION OF ARTERIOVENOUS (AV) GORE-TEX GRAFT THIGH;  Surgeon: Angelia Mould, MD;  Location: [place];  Service: Vascular;  Laterality: Right;  . False aneurysm repair Right 01/02/2013    Procedure: Excision of lymphocele in right thigh.;  Surgeon: Angelia Mould, MD;  Location: [place];  Service: Vascular;  Laterality: Right;  . Carpal tunnel release Right 03/02/2013    Procedure: CARPAL TUNNEL RELEASE;  Surgeon: Tennis Must, MD;  Location: [place];  Service: Orthopedics;  Laterality: Right;  . Flexible sigmoidoscopy N/A 03/24/2013    Procedure: FLEXIBLE SIGMOIDOSCOPY;  Surgeon: Cleotis Nipper, MD;  Location: [facility] ENDOSCOPY;  Service: Endoscopy;  Laterality: N/A;  . Revision of arteriovenous goretex graft Right 04/02/2013    Procedure: REPAIR OF PSEUDOANEURYSM OF ARTERIOVENOUS GORETEX GRAFT;  Surgeon: Angelia Mould, MD;  Location: [place];  Service: Vascular;  Laterality: Right;  . Carpal tunnel release Left 10/16/2013    Procedure: LEFT CARPAL TUNNEL RELEASE;  Surgeon: Tennis Must, MD;  Location: [place];  Service: Orthopedics;  Laterality: Left;    in for   Chief Complaint  Patient presents with  . Shortness of Breath  . Cough     HPI  [name]  is a 43 y.o. female,  hx of DM1, failed kidney /pancreas transplant, ESRD on HD TTS ,presenting with SOB to ED. Missed last HD on Sat because daughter had a bad car accident that day.developed SOB early this am, as well complaining of cough, productive  pneumonia, denies fever chills or chest pain,. Chest x-ray showing edema, possible pneumonia on x-ray, she was hypoxic on room air, SaO2 70's on RA. Requiring oxygen,.reports fever but no chills,  no abd pain, n/v/d. The patient was find to have hyperglycemia with a DKA, glucose more than 700, started on insulin drip, as well she had hyperkalemia.   Review of Systems    In addition to the HPI above,  Reports fever No Headache, No changes with Vision or hearing, No problems swallowing food or Liquids, No Chest pain, complains of Cough and Shortness of Breath, No Abdominal pain, No Nausea or Vommitting, Bowel movements are regular, No Blood in stool or Urine, No dysuria, No new skin rashes or bruises, No new joints pains-aches,  No new weakness, tingling, numbness in any extremity, No recent weight gain or loss, No polyuria, polydypsia or polyphagia, No significant Mental Stressors.  A full 10 point Review of Systems was done, except as stated above, all other Review of Systems were negative.   Social History History  Substance Use Topics  . Smoking status: Never Smoker   . Smokeless tobacco: Never Used  . Alcohol Use: No     Family History Family History  Problem Relation Age of Onset  . Hypertension Mother   . Kidney disease Mother   . Diabetes Mother   . Hypertension Father   . Kidney disease Father   . Diabetes Father      Prior to Admission medications   Medication Sig Start Date End Date Taking? Authorizing Provider  albuterol (PROVENTIL HFA;VENTOLIN HFA) 108 (90 BASE) MCG/ACT inhaler Inhale 2 puffs into the lungs every 4 (four) hours as needed for wheezing or shortness of breath (as per home regimen). 02/20/13  Yes Kinnie Feil, MD  aspirin EC 81 MG tablet Take 81 mg by mouth daily.    Yes Historical Provider, MD  calcium acetate (PHOSLO) 667 MG capsule Take 2,001 mg by mouth 3 (three) times daily with meals.   Yes Historical Provider, MD  clonazePAM  (KLONOPIN) 0.5 MG tablet Take 1 mg by mouth at bedtime.    Yes Historical Provider, MD  gabapentin (NEURONTIN) 100 MG capsule Take 100 mg by mouth daily.   Yes Historical Provider, MD  insulin aspart (NOVOLOG FLEXPEN) 100 UNIT/ML FlexPen Inject 6 Units into the skin 2 (two) times daily.   Yes Historical Provider, MD  Insulin Glargine (LANTUS) 100 UNIT/ML Solostar Pen Inject 7 Units into the skin 2 (two) times daily with a meal. 01/22/14  Yes Allie Bossier, MD  lidocaine-prilocaine (EMLA) cream Apply 1 application topically 2 (two) times a week.  10/24/12  Yes Historical Provider, MD  metoCLOPramide (REGLAN) 5 MG tablet Take 1 tablet (5 mg total) by mouth 4 (four) times daily -  before meals and at bedtime. 12/01/13  Yes Janece Canterbury, MD  montelukast (SINGULAIR) 10 MG tablet Take 10 mg by mouth at bedtime.   Yes Historical Provider, MD  multivitamin (RENA-VIT) TABS tablet Take 1 tablet by mouth daily.   Yes Historical Provider, MD  ondansetron (ZOFRAN-ODT) 4 MG disintegrating tablet Take 1 tablet (4 mg total) by mouth every 6 (six) hours as needed for nausea or vomiting. 12/01/13  Yes Janece Canterbury, MD  pantoprazole (PROTONIX) 40 MG tablet Take 1 tablet (40 mg total) by mouth at bedtime. 03/27/13  Yes Hosie Poisson, MD  zolpidem (AMBIEN) 5 MG tablet Take 5 mg by mouth at bedtime.   Yes Historical Provider, MD    Allergies  Allergen Reactions  . Depakote [Divalproex Sodium] Other (See Comments)    Pt gets "delirious"  . Morphine And Related Nausea And Vomiting and Other (See Comments)    "makes me delirious"  . Penicillins Anaphylaxis    Tolerated Zosyn Dec 2014  . Tramadol Nausea And Vomiting  . Imitrex [Sumatriptan] Other (See Comments)    seizures  . Vicodin [Hydrocodone-Acetaminophen] Itching and Rash    Physical Exam  Vitals  Blood pressure 124/70, pulse 89, temperature 99.2 F (37.3 C), temperature source Oral, resp. rate 33, height 5\' 1"  (1.549 m), weight 67.586 kg (149 lb), last  menstrual period 03/26/2014, SpO2 94 %.   1. General ill-appearing female lying in bed in mild respiratory distress.  2. Normal affect and insight, Not Suicidal or Homicidal, Awake Alert, Oriented X 3.  3. No F.N deficits, ALL C.Nerves Intact, Strength 5/5 all 4 extremities, Sensation intact all 4 extremities, Plantars down going.  4. Ears and Eyes appear Normal, Conjunctivae clear, PERRLA. dry Oral Mucosa.  5. Supple Neck, No JVD, No cervical lymphadenopathy appriciated, No Carotid Bruits.  6. Symmetrical Chest wall movement, Good air movement bilaterally, Bibasilar Rales.  7. RRR, No Gallops, Rubs or Murmurs, No Parasternal Heave.  8. Positive Bowel Sounds, Abdomen Soft, No tenderness, No organomegaly appriciated,No rebound -guarding or rigidity.  9.  No Cyanosis, Normal Skin Turgor, No Skin Rash or Bruise.  10. Good muscle tone,  joints appear normal , no effusions, Normal ROM.  11. No Palpable Lymph Nodes in Neck or Axillae    Data Review  CBC  Recent Labs Lab 04/13/14 0752 04/13/14 0802  WBC 9.4  --   HGB 10.3* 12.6  HCT 32.7* 37.0  PLT 169  --   MCV 100.3*  --   MCH 31.6  --   MCHC 31.5  --   RDW 14.4  --   LYMPHSABS 0.8  --   MONOABS 0.2  --   EOSABS 0.0  --   BASOSABS 0.0  --    ------------------------------------------------------------------------------------------------------------------  Chemistries   Recent Labs Lab 04/13/14 0752 04/13/14 0802  NA 127* 126*  K 6.8* 6.7*  CL 85* 98  CO2 14*  --   GLUCOSE 791* >700*  BUN 77* 77*  CREATININE 14.79* 16.00*  CALCIUM 8.4  --   AST 13  --   ALT 8  --   ALKPHOS 115  --   BILITOT 0.7  --    ------------------------------------------------------------------------------------------------------------------ estimated creatinine clearance is 4.1 mL/min (by C-G formula based on Cr of  16). ------------------------------------------------------------------------------------------------------------------  No results for input(s): TSH, T4TOTAL, T3FREE, THYROIDAB in the last 72 hours.  Invalid input(s): FREET3   Coagulation profile No results for input(s): INR, PROTIME in the last 168 hours. ------------------------------------------------------------------------------------------------------------------- No results for input(s): DDIMER in the last 72 hours. -------------------------------------------------------------------------------------------------------------------  Cardiac Enzymes No results for input(s): CKMB, TROPONINI, MYOGLOBIN in the last 168 hours.  Invalid input(s): CK ------------------------------------------------------------------------------------------------------------------ Invalid input(s): POCBNP   ---------------------------------------------------------------------------------------------------------------  Urinalysis    Component Value Date/Time   COLORURINE YELLOW 09/07/2010 1517   APPEARANCEUR TURBID* 09/07/2010 1517   LABSPEC 1.016 09/07/2010 1517   PHURINE 7.0 09/07/2010 1517   GLUCOSEU >1000* 09/07/2010 1517   HGBUR LARGE* 09/07/2010 1517   BILIRUBINUR NEGATIVE 09/07/2010 1517   KETONESUR NEGATIVE 09/07/2010 1517   PROTEINUR >300* 09/07/2010 1517   UROBILINOGEN 0.2 09/07/2010 1517   NITRITE NEGATIVE 09/07/2010 1517   LEUKOCYTESUR LARGE* 09/07/2010 1517    ----------------------------------------------------------------------------------------------------------------  Imaging results:   Dg Chest Port 1 View  04/13/2014   CLINICAL DATA:  Cough, shortness of breath and asthma.  EXAM: PORTABLE CHEST - 1 VIEW  COMPARISON:  01/26/2014  FINDINGS: The cardiac silhouette, mediastinal and hilar contours are stable. Persistent diffuse airspace process with slight further right lower lobe consolidation. Probable small pleural  effusions. No pneumothorax.  IMPRESSION: Persistent diffuse airspace process with slight increase in right lower lobe consolidation.   Electronically Signed   By: Kalman Jewels M.D.   On: 04/13/2014 07:42    My personal review of EKG: Rhythm NSR, Rate 90 /min, QTc 486 , no Acute ST changes    Assessment & Plan  Active Problems:   ESRD (end stage renal disease)   Hyperlipidemia   GERD (gastroesophageal reflux disease)   HCAP (healthcare-associated pneumonia)   Acute respiratory failure with hypoxia   Diabetic ketoacidosis without coma associated with diabetes mellitus due to underlying condition    Acute respiratory failure/hypoxic -This is secondary to volume overload, and healthcare acquired pneumonia, continue with nebs and oxygen as needed.  Volume overload/pulmonary edema -Secondary to missing hemodialysis, patient is being emergently dialyzed, already seen by nephrology  Healthcare acquired pneumonia -Continue with IV vancomycin and aztreonam, follow on cultures  Hyperkalemia -No EKG changes, secondary to missing hemodialysis, monitor on telemetry, patient is being emergently dialyzed  End-stage renal disease -Nephrology consulted  Diabetic ketoacidosis -Patient is on insulin drip, will transition to Lantus and insulin sliding scale when anion gap closes  Hypertension -Not on any home medications as per list   DVT Prophylaxis Heparin  AM Labs Ordered, also please review Full Orders  Family Communication: Admission, patients condition and plan of care including tests being ordered have been discussed with the patient  who indicate understanding and agree with the plan and Code Status.  Code Status Full  Disposition: admit to stepdown  Condition GUARDED    Time spent in minutes : 65 minutes    ELGERGAWY, DAWOOD M.D on 04/13/2014 at 10:46 AM  Between 7am to 7pm - Pager - [phone]  After 7pm go to [email or web address] - password [email or web address]  And look for the  night coverage person covering me after hours  [facility] Office  [phone]   **Disclaimer: This note may have been dictated with voice recognition software. Similar sounding words can inadvertently be transcribed and this note may contain transcription errors which may not have been corrected upon publication of note.**

## 2014-04-13 NOTE — Progress Notes (Signed)
Nurse just called Dr. [facility] to clarify orders. Pt had an insulin drip to be started at 0800 this am but went from ED to dialysis and now here therefore not started. Dr. [facility] ordered a stat lab BMP and stated to wait for results prior to starting drip. Placing order now.

## 2014-04-13 NOTE — Progress Notes (Addendum)
ANTIBIOTIC CONSULT NOTE - INITIAL  Pharmacy Consult for Zosyn and Vancomycin Indication: pneumonia  Allergies  Allergen Reactions  . Depakote [Divalproex Sodium] Other (See Comments)    Pt gets "delirious"  . Morphine And Related Nausea And Vomiting and Other (See Comments)    "makes me delirious"  . Penicillins Anaphylaxis    Tolerated Zosyn Dec 2014  . Tramadol Nausea And Vomiting  . Imitrex [Sumatriptan] Other (See Comments)    seizures  . Vicodin [Hydrocodone-Acetaminophen] Itching and Rash    Patient Measurements: Height: 5\' 1"  (154.9 cm) Weight: 149 lb (67.586 kg) IBW/kg (Calculated) : 47.8   Vital Signs: Temp: 98.3 F (36.8 C) (11/17 0801) Temp Source: Oral (11/17 0801) BP: 147/[name] mmHg (11/17 0801) Pulse Rate: 89 (11/17 0801) Intake/Output from previous day:   Intake/Output from this shift:    Labs:  Recent Labs  04/13/14 0752 04/13/14 0802  WBC 9.4  --   HGB 10.3* 12.6  PLT 169  --   CREATININE  --  16.00*   Estimated Creatinine Clearance: 4.1 mL/min (by C-G formula based on Cr of 16). No results for input(s): VANCOTROUGH, VANCOPEAK, VANCORANDOM, GENTTROUGH, GENTPEAK, GENTRANDOM, TOBRATROUGH, TOBRAPEAK, TOBRARND, AMIKACINPEAK, AMIKACINTROU, AMIKACIN in the last 72 hours.   Microbiology: No results found for this or any previous visit (from the past 720 hour(s)).  Medical History: Past Medical History  Diagnosis Date  . Hyperlipidemia   . Alopecia   . Obesity   . Iron deficiency   . ESRD (end stage renal disease)     S/p pancreatic and kidney transplant, but back on HD 2008, dialysis at [facility], [place], Sat  . RLS (restless legs syndrome)   . Injury of conjunctiva and corneal abrasion of right eye without foreign body   . Cellulitis   . Anemia   . Blood transfusion 2004    "when I had my transplant"  . Migraine   . Hyperparathyroidism, secondary   . Diabetic retinopathy   . Hypertension     takes Metoprolol and Exforge daily   . Depression     takes Klonopin nightly  . Diabetes mellitus type 1     Pt states diagnosed at age 45 with prior episodes of DKA. S/p pancreatic transplant   . GERD (gastroesophageal reflux disease)     takes Protonix daily  . Bronchitis   . Migraine   . Asthma   . Emphysema of lung   . Angina     none in past 2 years.  . Stomach pain     Hx: of chronic  . Pneumonia 2012    "double"  . Dialysis patient     tues,thurs,sat  . 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  Surgery was done at [facility].       Assessment: 43 [name] with ESRD on HD TTS who presents today with complaints of chronic cough x1 month that has been productive with yellow sputum. Missed dialysis on Saturday. She became hypoxic in ED and CXR showed RLL infiltrate along with signs of pulmonary edema. She is to get dialysis today- renal has not yet seen. Blood cultures have been sent.   Noted allergy to penicillins, however it is also noted that patient has tolerated Zosyn in the past.  Goal of Therapy:  pre-HD  vancomycin level 15-39mcg/mL  Plan:  1. Zosyn 2.25g IV q8h 2. Vancomycin 1500mg  IV x1 as a loading dose 3. Will follow dialysis schedule for future vancomycin doses- she will need 750mg  IV after each session 4. Follow c/s, clinical progression  Ka Bench D. Zriyah Kopplin, PharmD, BCPS Clinical Pharmacist Pager: [phone] 04/13/2014 8:28 AM

## 2014-04-13 NOTE — Consult Note (Addendum)
Renal Service Consult Note [place] 04/13/2014 Sol Blazing Requesting Physician:  Dr Darl Householder  Reason for Consult:  ESRD pt with SOB HPI: The patient is a 43 y.o. year-old with hx of DM1, failed KP tx, ESRD on HD presenting with SOB to ED. Missed last HD on Sat d/t daughter had a bad car accident that day.  Felt fine Monday and thought could make it until HD today but developed SOB early this am, came to ED at 5 am approx. +pulm edema, poss PNA on CXR, SaO2 70's on RA. Improved with FM O2.  +fevers, no chills, no prod cough, no abd pain, n/v/d.  No probs w thigh AVG. TTS HD at [place].     Chart review: 07 - bilat LE cellulitis, KP transplant, HTN, DM in remission 09 - pulm edema, high K, PNA, panc Tx, ESRD on HD, HTN 10 - hypoxemia, resp failure, pulm edema, esrd on HD, HTN, panc tx 11 - steal syndrome, R hand > RUE BVT underwent DRIL procedure w improved symptoms 11 - pyelo, atypcial CP, esrd on HD, migraines, panc Tx 3/12 - Pantoea agglomerans bacteremia, DM type I brittle, ESRD on HD, panc Tx on IS  4/12 - acute cystitis, L flank pain M-S, brittle DM1, esrd on HD, KP transplant still on IS 6/12 - facial twitch prob medication reaction, ESRD on HD 7/12 - hypoglycemia d/t inadvertent insulin overdose, ESRD, KP tx 9/12 - acute bronchitis, brittle DM1, esrd , KP tx 9/12 - HCAP, HONK, esrd and KP tx 2/13 - acute gastroenteritis, ESRD on HD, cough, CP 3/13 - DKA type 1, ESRD on HD, panc Tx on Rx 10/13 - ligation of LUA AVG d/t central venous occlusion, placement of R thigh AVG 1/14 - HONK, ESRD, DM1, panc Tx 2/14 - DKA, DM1, pulm edema, chronic bronchitis, ESRD, migraine, panc tx on IS rx 6/14 - hyperkalemia, esrd on HD, panc Tx > rx with HD 8/14 - hypoglycemia, esrd on HD, fever/prob HCAP, excision of lymphocele from R thigh AVG area by VVS, panc Tx 9/14 - DKA, DM1, acute bronch, ESRD on HD, hx KP tx on low-dose IS 10/14 - abd pain / diffuse colitis by  CT, esrd and panc tx 10/14 - rectal bleeding, recent colitis poss d/t Cellcept, esrd and KP tx 11/14 - DKA, ESRD , hx KP tx > per Dr Roxy Horseman Cellcept was stopped d/t concerns about "colitis", prograf to be tapered off in one month, pred taper off next 6 mos, esrd on HD 12/14 - AMS, hypoxic acute resp failure, vol overload/ HCAP > rx with HD and ABx, DM 1 uncont, KP tx on tapering prograf and pred 6/29 - 12/01/13 > HCAP, acute resp failure > rx with IV abx, DKA resolved, ESRD on HD, n/v felt to be gastroparesis possibly, HTN, ashtma on albuterol, RLE swelling dopp negative, anemai 8/20 - 01/22/14 > HONK, brittle DM1, high K, ESRD on hd, hx KP tx off of IS meds, anemia, HTN , gastroparesis on reglan   ROS  no rash  no jt pain  no HA  no blurred vision  Past Medical History  Past Medical History  Diagnosis Date  . Hyperlipidemia   . Alopecia   . Obesity   . Iron deficiency   . ESRD (end stage renal disease)     S/p pancreatic and kidney transplant, but back on HD 2008, dialysis at [facility] [place], Sat  . RLS (restless legs syndrome)   .  Injury of conjunctiva and corneal abrasion of right eye without foreign body   . Cellulitis   . Anemia   . Blood transfusion 2004    "when I had my transplant"  . Migraine   . Hyperparathyroidism, secondary   . Diabetic retinopathy   . Hypertension     takes Metoprolol and Exforge daily  . Depression     takes Klonopin nightly  . Diabetes mellitus type 1     Pt states diagnosed at age 27 with prior episodes of DKA. S/p pancreatic transplant   . GERD (gastroesophageal reflux disease)     takes Protonix daily  . Bronchitis   . Migraine   . Asthma   . Emphysema of lung   . Angina     none in past 2 years.  . Stomach pain     Hx: of chronic  . Pneumonia 2012    "double"  . Dialysis patient     tues,thurs,sat  . 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  Surgery was done at [facility].      Past Surgical History  Past Surgical History  Procedure Laterality Date  . 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 Thyroglobulin      x 7  . Av fistula placement      right upper arm  . Cholecystectomy  1995  .  hd graft placement/removal      "had 2 in my left upper arm"  . Eye surgery    . Retinopathy surgery    . Tooth extraction  10/10/11    X's two  . Insertion of dialysis catheter  12/08/2011    Procedure: INSERTION OF DIALYSIS CATHETER;  Surgeon: Angelia Mould, MD;  Location: [place];  Service: Vascular;  Laterality: Left;  . Av fistula placement  01/22/2012    Procedure: INSERTION OF ARTERIOVENOUS (AV) GORE-TEX GRAFT ARM;  Surgeon: Angelia Mould, MD;  Location: [place];  Service: Vascular;  Laterality: Left;  Ultrasound guided.  . Av fistula placement  03/14/2012    Procedure: INSERTION OF ARTERIOVENOUS (AV) GORE-TEX GRAFT THIGH;  Surgeon: Angelia Mould, MD;  Location: [place];  Service: Vascular;  Laterality: Right;  . False aneurysm repair Right 01/02/2013    Procedure: Excision of lymphocele in right thigh.;  Surgeon: Angelia Mould, MD;  Location: [place];  Service: Vascular;  Laterality: Right;  . Carpal tunnel release Right 03/02/2013    Procedure: CARPAL TUNNEL RELEASE;  Surgeon: Tennis Must, MD;  Location: [place];  Service: Orthopedics;  Laterality: Right;  . Flexible sigmoidoscopy N/A 03/24/2013    Procedure: FLEXIBLE SIGMOIDOSCOPY;  Surgeon: Cleotis Nipper, MD;  Location: [facility] ENDOSCOPY;  Service: Endoscopy;  Laterality: N/A;  . Revision of arteriovenous goretex graft Right 04/02/2013    Procedure: REPAIR OF PSEUDOANEURYSM OF ARTERIOVENOUS GORETEX GRAFT;  Surgeon: Angelia Mould, MD;  Location: [place];  Service: Vascular;   Laterality: Right;  . Carpal tunnel release Left 10/16/2013    Procedure: LEFT CARPAL TUNNEL RELEASE;  Surgeon: Tennis Must, MD;  Location: [place];  Service: Orthopedics;  Laterality: Left;   Family History  Family History  Problem Relation Age of Onset  . Hypertension Mother   .  Kidney disease Mother   . Diabetes Mother   . Hypertension Father   . Kidney disease Father   . Diabetes Father    Social History  reports that she has never smoked. She has never used smokeless tobacco. She reports that she does not drink alcohol or use illicit drugs. Allergies  Allergies  Allergen Reactions  . Depakote [Divalproex Sodium] Other (See Comments)    Pt gets "delirious"  . Morphine And Related Nausea And Vomiting and Other (See Comments)    "makes me delirious"  . Penicillins Anaphylaxis    Tolerated Zosyn Dec 2014  . Tramadol Nausea And Vomiting  . Imitrex [Sumatriptan] Other (See Comments)    seizures  . Vicodin [Hydrocodone-Acetaminophen] Itching and Rash   Home medications Prior to Admission medications   Medication Sig Start Date End Date Taking? Authorizing Provider  albuterol (PROVENTIL HFA;VENTOLIN HFA) 108 (90 BASE) MCG/ACT inhaler Inhale 2 puffs into the lungs every 4 (four) hours as needed for wheezing or shortness of breath (as per home regimen). 02/20/13  Yes Kinnie Feil, MD  aspirin EC 81 MG tablet Take 81 mg by mouth daily.    Yes Historical Provider, MD  calcium acetate (PHOSLO) 667 MG capsule Take 2,001 mg by mouth 3 (three) times daily with meals.   Yes Historical Provider, MD  clonazePAM (KLONOPIN) 0.5 MG tablet Take 1 mg by mouth at bedtime.    Yes Historical Provider, MD  gabapentin (NEURONTIN) 100 MG capsule Take 100 mg by mouth daily.   Yes Historical Provider, MD  insulin aspart (NOVOLOG FLEXPEN) 100 UNIT/ML FlexPen Inject 6 Units into the skin 2 (two) times daily.   Yes Historical Provider, MD  Insulin Glargine (LANTUS) 100 UNIT/ML Solostar  Pen Inject 7 Units into the skin 2 (two) times daily with a meal. 01/22/14  Yes Allie Bossier, MD  lidocaine-prilocaine (EMLA) cream Apply 1 application topically 2 (two) times a week.  10/24/12  Yes Historical Provider, MD  metoCLOPramide (REGLAN) 5 MG tablet Take 1 tablet (5 mg total) by mouth 4 (four) times daily -  before meals and at bedtime. 12/01/13  Yes Janece Canterbury, MD  montelukast (SINGULAIR) 10 MG tablet Take 10 mg by mouth at bedtime.   Yes Historical Provider, MD  multivitamin (RENA-VIT) TABS tablet Take 1 tablet by mouth daily.   Yes Historical Provider, MD  ondansetron (ZOFRAN-ODT) 4 MG disintegrating tablet Take 1 tablet (4 mg total) by mouth every 6 (six) hours as needed for nausea or vomiting. 12/01/13  Yes Janece Canterbury, MD  pantoprazole (PROTONIX) 40 MG tablet Take 1 tablet (40 mg total) by mouth at bedtime. 03/27/13  Yes Hosie Poisson, MD  zolpidem (AMBIEN) 5 MG tablet Take 5 mg by mouth at bedtime.   Yes Historical Provider, MD   Liver Function Tests  Recent Labs Lab 04/13/14 0752  AST 13  ALT 8  ALKPHOS 115  BILITOT 0.7  PROT 7.9  ALBUMIN 3.5   No results for input(s): LIPASE, AMYLASE in the last 168 hours. CBC  Recent Labs Lab 04/13/14 0752 04/13/14 0802  WBC 9.4  --   NEUTROABS 8.3*  --   HGB 10.3* 12.6  HCT 32.7* 37.0  MCV 100.3*  --   PLT 169  --    Basic Metabolic Panel  Recent Labs Lab 04/13/14 0752 04/13/14 0802  NA 127* 126*  K 6.8* 6.7*  CL 85* 98  CO2 14*  --   GLUCOSE 791* >700*  BUN 77* 77*  CREATININE 14.79* 16.00*  CALCIUM 8.4  --     Filed Vitals:   04/13/14 0754 04/13/14 0800 04/13/14 0801 04/13/14 0830  BP:  147/80 147/80 166/68  Pulse:  88 89 90  Temp:   98.3 F (36.8 C)   TempSrc:   Oral   Resp:  24 28 23   Height:      Weight:      SpO2: 72% 97% 97% 100%   Exam Some audible rales w inspiration, on FM O2 , not in severe distress, is dyspneic at rest No rash, cyanosis or gangrene Sclera anicteric, throat  clear +JVD Chest rales R base, L base dec'd BS and rales RRR 2/6 soft SEM no RG Abd soft, NTND no ascites No LE or UE edema, no gangrene or leg wounds Neuro is nf, Ox 3 R thigh AVG +bruit  HD: TTS NW F160  3h 80min, 68.5kh  2/2.5 [place]  R thigh AVG  Heparin none Hect 2 ug TIW, Aranesp 25 mcg weekly, Venofer 50/wk maint  Assessment: 1 Resp failure / hypoxemia - d/t vol overload primarily 2 Possible PNA 3 Hyperkalemia 4 ESRD  5 DM 1 6 Hx failed KP tx , 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 7 Asthma - not wheezing at this time 8 Sec HPTH cont vit D, phoslo 9 HTN not on BP meds per home med list   Plan- acute HD upstairs asap, to be admitted to medical service  Kelly Splinter MD (pgr) [phone]    ([phone] 04/13/2014, 9:08 AM

## 2014-04-13 NOTE — ED Provider Notes (Signed)
CSN: [id number]     Arrival date & time 04/13/14  6767 History   First MD Initiated Contact with Patient 04/13/14 [phone]     Chief Complaint  Patient presents with  . Shortness of Breath  . Cough    HPI [name] is a 43 year old woman with history of HLD, HTN, asthma, type 1 diabetes s/p failed pancreas transplant, and ESRD on TTS dialysis presenting with shortness of breath and chest pain.  She reports chronic cough over the last few months that has been productive of yellow sputum.  She did not go to dialysis on Saturday because her daughter got in a car accident.  This morning she woke with worsening shortness of breath and right sided chest discomfort.  She says that her shortness of breath is worse with deep inspiration and coughing, and she has trouble laying flat.  She also reports a headache since this morning and says her sugars have been running high.  She says that she did not take her insulin this morning.  She denies fevers, chills, lower extremity edema, or anginal symptoms.  She is on 2-3L of oxygen at home.   Past Medical History  Diagnosis Date  . Hyperlipidemia   . Alopecia   . Obesity   . Iron deficiency   . ESRD (end stage renal disease)     S/p pancreatic and kidney transplant, but back on HD 2008, dialysis at [facility], [place], Sat  . RLS (restless legs syndrome)   . Injury of conjunctiva and corneal abrasion of right eye without foreign body   . Cellulitis   . Anemia   . Blood transfusion 2004    "when I had my transplant"  . Migraine   . Hyperparathyroidism, secondary   . Diabetic retinopathy   . Hypertension     takes Metoprolol and Exforge daily  . Depression     takes Klonopin nightly  . Diabetes mellitus type 1     Pt states diagnosed at age 43 with prior episodes of DKA. S/p pancreatic transplant   . GERD (gastroesophageal reflux disease)     takes Protonix daily  . Bronchitis   . Migraine   . Asthma   . Emphysema of lung   . Angina      none in past 2 years.  . Stomach pain     Hx: of chronic  . Pneumonia 2012    "double"  . Dialysis patient     tues,thurs,sat  . 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  Surgery was done at [facility].      Past Surgical History  Procedure Laterality Date  . 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 Thyroglobulin      x 7  . Av fistula placement      right upper arm  . Cholecystectomy  1995  .  hd graft placement/removal      "had 2 in my left upper arm"  . Eye surgery    . Retinopathy surgery    . Tooth extraction  10/10/11    X's two  . Insertion of dialysis catheter  12/08/2011    Procedure: INSERTION OF DIALYSIS CATHETER;  Surgeon: Angelia Mould, MD;  Location: [place];  Service: Vascular;  Laterality: Left;  . Av fistula placement  01/22/2012    Procedure: INSERTION OF ARTERIOVENOUS (AV) GORE-TEX GRAFT ARM;  Surgeon: Angelia Mould, MD;  Location: [place];  Service: Vascular;  Laterality: Left;  Ultrasound guided.  . Av fistula placement  03/14/2012    Procedure: INSERTION OF ARTERIOVENOUS (AV) GORE-TEX GRAFT THIGH;  Surgeon: Angelia Mould, MD;  Location: [place];  Service: Vascular;  Laterality: Right;  . False aneurysm repair Right 01/02/2013    Procedure: Excision of lymphocele in right thigh.;  Surgeon: Angelia Mould, MD;  Location: [place];  Service: Vascular;  Laterality: Right;  . Carpal tunnel release Right 03/02/2013    Procedure: CARPAL TUNNEL RELEASE;  Surgeon: Tennis Must, MD;  Location: [place];  Service: Orthopedics;  Laterality: Right;  . Flexible sigmoidoscopy N/A 03/24/2013    Procedure: FLEXIBLE SIGMOIDOSCOPY;  Surgeon: Cleotis Nipper, MD;  Location: [facility] ENDOSCOPY;  Service:  Endoscopy;  Laterality: N/A;  . Revision of arteriovenous goretex graft Right 04/02/2013    Procedure: REPAIR OF PSEUDOANEURYSM OF ARTERIOVENOUS GORETEX GRAFT;  Surgeon: Angelia Mould, MD;  Location: [place];  Service: Vascular;  Laterality: Right;  . Carpal tunnel release Left 10/16/2013    Procedure: LEFT CARPAL TUNNEL RELEASE;  Surgeon: Tennis Must, MD;  Location: [place];  Service: Orthopedics;  Laterality: Left;   Family History  Problem Relation Age of Onset  . Hypertension Mother   . Kidney disease Mother   . Diabetes Mother   . Hypertension Father   . Kidney disease Father   . Diabetes Father    History  Substance Use Topics  . Smoking status: Never Smoker   . Smokeless tobacco: Never Used  . Alcohol Use: No   OB History    No data available     Review of Systems  Constitutional: Positive for diaphoresis and fatigue. Negative for fever, chills, activity change and appetite change.  HENT: Negative for congestion and rhinorrhea.   Respiratory: Positive for cough, chest tightness and shortness of breath. Negative for wheezing.   Cardiovascular: Negative for chest pain and palpitations.  Gastrointestinal: Negative for nausea, vomiting, abdominal pain, diarrhea, constipation and abdominal distention.  Genitourinary: Negative for dysuria and difficulty urinating.  Musculoskeletal: Negative for myalgias and arthralgias.  Skin: Negative for color change and rash.  Neurological: Positive for light-headedness and headaches. Negative for dizziness and numbness.      Allergies  Depakote; Morphine and related; Penicillins; Tramadol; Imitrex; and Vicodin  Home Medications   Prior to Admission medications   Medication Sig Start Date End Date Taking? Authorizing Provider  albuterol (PROVENTIL HFA;VENTOLIN HFA) 108 (90 BASE) MCG/ACT inhaler Inhale 2 puffs into the lungs every 4 (four) hours as needed for wheezing or shortness of breath (as per home  regimen). 02/20/13  Yes Kinnie Feil, MD  aspirin EC 81 MG tablet Take 81 mg by mouth daily.    Yes Historical Provider, MD  calcium acetate (PHOSLO) 667 MG capsule Take 2,001 mg by mouth 3 (three) times daily with meals.   Yes Historical Provider, MD  clonazePAM (KLONOPIN) 0.5 MG tablet Take 1 mg by mouth at bedtime.    Yes Historical Provider, MD  gabapentin (NEURONTIN) 100 MG capsule Take 100 mg by mouth daily.   Yes Historical Provider, MD  insulin aspart (NOVOLOG FLEXPEN) 100 UNIT/ML FlexPen Inject 6 Units into the skin 2 (two) times daily.   Yes Historical Provider, MD  Insulin Glargine (LANTUS) 100 UNIT/ML Solostar Pen Inject 7 Units into the skin 2 (two) times daily with a meal. 01/22/14  Yes Allie Bossier, MD  lidocaine-prilocaine (EMLA) cream Apply 1 application topically 2 (two) times a week.  10/24/12  Yes Historical Provider, MD  metoCLOPramide (REGLAN) 5 MG tablet Take 1 tablet (5 mg total) by mouth 4 (four) times daily -  before meals and at bedtime. 12/01/13  Yes Janece Canterbury, MD  montelukast (SINGULAIR) 10 MG tablet Take 10 mg by mouth at bedtime.   Yes Historical Provider, MD  multivitamin (RENA-VIT) TABS tablet Take 1 tablet by mouth daily.   Yes Historical Provider, MD  ondansetron (ZOFRAN-ODT) 4 MG disintegrating tablet Take 1 tablet (4 mg total) by mouth every 6 (six) hours as needed for nausea or vomiting. 12/01/13  Yes Janece Canterbury, MD  pantoprazole (PROTONIX) 40 MG tablet Take 1 tablet (40 mg total) by mouth at bedtime. 03/27/13  Yes Hosie Poisson, MD  zolpidem (AMBIEN) 5 MG tablet Take 5 mg by mouth at bedtime.   Yes Historical Provider, MD   BP 147/80 mmHg  Pulse 89  Temp(Src) 98.3 F (36.8 C) (Oral)  Resp 28  Ht 5\' 1"  (1.549 m)  Wt 149 lb (67.586 kg)  BMI 28.17 kg/m2  SpO2 97%  LMP 03/26/2014 Physical Exam  Constitutional: She is oriented to person, place, and time. She appears well-developed and well-nourished. She appears distressed (Mild discomfort.).   HENT:  Head: Normocephalic and atraumatic.  Mouth/Throat: No oropharyngeal exudate.  Eyes: Conjunctivae and EOM are normal. Pupils are equal, round, and reactive to light. No scleral icterus.  Neck: Normal range of motion. Neck supple.  Cardiovascular: Normal rate, regular rhythm and normal heart sounds.   Pulmonary/Chest: She is in respiratory distress. She has no wheezes. She has rales (Bilateral throughout all lung fields.). She exhibits no tenderness.  Breath sounds decreased at R lung base.  Abdominal: Soft. Bowel sounds are normal. She exhibits no distension. There is no tenderness.  Musculoskeletal: Normal range of motion. She exhibits no edema or tenderness.  Lymphadenopathy:    She has no cervical adenopathy.  Neurological: She is alert and oriented to person, place, and time. No cranial nerve deficit. She exhibits normal muscle tone.  Skin: Skin is warm and dry. No rash noted. No erythema.    ED Course  Procedures (including critical care time) Labs Review Labs Reviewed  I-STAT CHEM 8, ED - Abnormal; Notable for the following:    Sodium 126 (*)    Potassium 6.7 (*)    BUN 77 (*)    Creatinine, Ser 16.00 (*)    Glucose, Bld >700 (*)    Calcium, Ion 1.02 (*)    All other components within normal limits  CBG MONITORING, ED - Abnormal; Notable for the following:    Glucose-Capillary >600 (*)    All other components within normal limits  CULTURE, BLOOD (ROUTINE X 2)  CULTURE, BLOOD (ROUTINE X 2)  CBC WITH DIFFERENTIAL  COMPREHENSIVE METABOLIC PANEL  I-STAT TROPOININ, ED  I-STAT CG4 LACTIC ACID, ED    Imaging Review Dg Chest Port 1 View  04/13/2014   CLINICAL DATA:  Cough, shortness of breath and asthma.  EXAM: PORTABLE CHEST - 1 VIEW  COMPARISON:  01/26/2014  FINDINGS: The cardiac silhouette, mediastinal and hilar contours are stable. Persistent diffuse airspace process with slight further right lower lobe consolidation. Probable small pleural effusions. No  pneumothorax.  IMPRESSION: Persistent diffuse airspace process with slight increase  in right lower lobe consolidation.   Electronically Signed   By: Kalman Jewels M.D.   On: 04/13/2014 07:42     EKG Interpretation   Date/Time:  Tuesday April 13 2014 06:44:37 EST Ventricular Rate:  90 PR Interval:  197 QRS Duration: 89 QT Interval:  397 QTC Calculation: 486 R Axis:   93 Text Interpretation:  Sinus rhythm Left atrial enlargement Borderline  right axis deviation Borderline prolonged QT interval Confirmed by Sharol Given   MD, OLGA ([id number]) on 04/13/2014 6:47:53 AM      MDM   Final diagnoses:  Cough   7:46 am: Dyspnea most likely secondary to missing dialysis and volume overload, although pneumonia also possible given productive cough but no fever or tachycardia, and she does have a history of asthma.  She will need dialysis today.  Chest pain likely related to respiratory distress, but will rule out ACS. EKG with no signs of ischemia. Will start with chest x-ray, CBC, CBG, i-stat chem 8 and troponin.  8:05 am: Patient hypoxic to 73 on room air with RLL infiltrate and signs of pulmonary edema.  Will start vanc/Zosyn for HCAP.  CBG>600, so will start insulin drip.  Hyperkalemic, consulting nephrology for dialysis.  PORT score 151, class V will admit for HCAP, DKA, hyperkalemia and volume overload from missing dialysis.  Arman Filter, MD 04/13/14 9179  Wandra Arthurs, MD 04/13/14 [phone]

## 2014-04-13 NOTE — ED Notes (Signed)
Patient presents via EMS with c/o SOB and cough, right sided CP which increases with deep breathing, movement, cough  Missed dialysis on Saturday stated her daughter was in an accident and she did not have a way to dialysis.  Patient normally on oxygen at home (3l)  EMS reported her CBG was high

## 2014-04-13 NOTE — Progress Notes (Signed)
Utilization Review Completed.  

## 2014-04-13 NOTE — Plan of Care (Signed)
Problem: Phase I Progression Outcomes Goal: OOB as tolerated unless otherwise ordered Outcome: Progressing Goal: Initial discharge plan identified Outcome: Progressing Goal: Voiding-avoid urinary catheter unless indicated Outcome: Not Applicable Date Met:  39/68/86 Goal: Pt. states reason for hospitalization Outcome: Completed/Met Date Met:  04/13/14

## 2014-04-13 NOTE — Plan of Care (Signed)
Pt has very poor to no IV access. IV team has tried multiple times. PICC can not be placed tonight due to pt's CKD and will have to be placed by IR tomorrow. Will hold Zosyn dose tonight in light of incompatibility with insulin gtt. PICC asap in am.

## 2014-04-13 NOTE — ED Notes (Signed)
Chem 8 results given to Dr. Yao 

## 2014-04-14 ENCOUNTER — Inpatient Hospital Stay (HOSPITAL_COMMUNITY): Payer: Medicare Other

## 2014-04-14 DIAGNOSIS — E8770 Fluid overload, unspecified: Secondary | ICD-10-CM | POA: Diagnosis present

## 2014-04-14 DIAGNOSIS — K3184 Gastroparesis: Secondary | ICD-10-CM

## 2014-04-14 LAB — BASIC METABOLIC PANEL
Anion gap: 19 — ABNORMAL HIGH (ref 5–15)
Anion gap: 17 — ABNORMAL HIGH (ref 5–15)
BUN: 30 mg/dL — AB (ref 6–23)
BUN: 31 mg/dL — AB (ref 6–23)
CALCIUM: 8.9 mg/dL (ref 8.4–10.5)
CO2: 24 meq/L (ref 19–32)
CO2: 23 mEq/L (ref 19–32)
CREATININE: 6.6 mg/dL — AB (ref 0.50–1.10)
CREATININE: 7.42 mg/dL — AB (ref 0.50–1.10)
Calcium: 8.7 mg/dL (ref 8.4–10.5)
Chloride: 94 mEq/L — ABNORMAL LOW (ref 96–112)
Chloride: 92 mEq/L — ABNORMAL LOW (ref 96–112)
GFR calc Af Amer: 8 mL/min — ABNORMAL LOW (ref >90)
GFR calc non Af Amer: 7 mL/min — ABNORMAL LOW (ref >90)
GFR calc non Af Amer: 6 mL/min — ABNORMAL LOW (ref >90)
GFR, EST AFRICAN AMERICAN: 7 mL/min — AB (ref >90)
GLUCOSE: 111 mg/dL — AB (ref 70–99)
Glucose, Bld: 90 mg/dL (ref 70–99)
POTASSIUM: 4 meq/L (ref 3.7–5.3)
Potassium: 3.4 mEq/L — ABNORMAL LOW (ref 3.7–5.3)
SODIUM: 137 meq/L (ref 137–147)
Sodium: 132 mEq/L — ABNORMAL LOW (ref 137–147)

## 2014-04-14 LAB — GLUCOSE, CAPILLARY
GLUCOSE-CAPILLARY: 465 mg/dL — AB (ref 70–99)
GLUCOSE-CAPILLARY: 490 mg/dL — AB (ref 70–99)
GLUCOSE-CAPILLARY: 180 mg/dL — AB (ref 70–99)
GLUCOSE-CAPILLARY: 46 mg/dL — AB (ref 70–99)
GLUCOSE-CAPILLARY: 55 mg/dL — AB (ref 70–99)
GLUCOSE-CAPILLARY: 346 mg/dL — AB (ref 70–99)
Glucose-Capillary: 110 mg/dL — ABNORMAL HIGH (ref 70–99)
Glucose-Capillary: 144 mg/dL — ABNORMAL HIGH (ref 70–99)
Glucose-Capillary: 252 mg/dL — ABNORMAL HIGH (ref 70–99)
Glucose-Capillary: 299 mg/dL — ABNORMAL HIGH (ref 70–99)
Glucose-Capillary: 344 mg/dL — ABNORMAL HIGH (ref 70–99)
Glucose-Capillary: 389 mg/dL — ABNORMAL HIGH (ref 70–99)
Glucose-Capillary: 54 mg/dL — ABNORMAL LOW (ref 70–99)
Glucose-Capillary: 54 mg/dL — ABNORMAL LOW (ref 70–99)
Glucose-Capillary: 55 mg/dL — ABNORMAL LOW (ref 70–99)
Glucose-Capillary: 57 mg/dL — ABNORMAL LOW (ref 70–99)
Glucose-Capillary: 57 mg/dL — ABNORMAL LOW (ref 70–99)
Glucose-Capillary: 76 mg/dL (ref 70–99)
Glucose-Capillary: 92 mg/dL (ref 70–99)
Glucose-Capillary: 93 mg/dL (ref 70–99)

## 2014-04-14 LAB — CBC
HCT: 27.6 % — ABNORMAL LOW (ref 36.0–46.0)
Hemoglobin: 9 g/dL — ABNORMAL LOW (ref 12.0–15.0)
MCH: 30.9 pg (ref 26.0–34.0)
MCHC: 32.6 g/dL (ref 30.0–36.0)
MCV: 94.8 fL (ref 78.0–100.0)
PLATELETS: 195 10*3/uL (ref 150–400)
RBC: 2.91 MIL/uL — ABNORMAL LOW (ref 3.87–5.11)
RDW: 14.4 % (ref 11.5–15.5)
WBC: 7.4 10*3/uL (ref 4.0–10.5)

## 2014-04-14 MED ORDER — DEXTROSE 10 % IV SOLN
INTRAVENOUS | Status: DC
  Administered 2014-04-14: 06:00:00 via INTRAVENOUS

## 2014-04-14 MED ORDER — INSULIN ASPART 100 UNIT/ML ~~LOC~~ SOLN: 0.0000 [IU] | Freq: Three times a day (TID) | SUBCUTANEOUS | Status: DC

## 2014-04-14 MED ORDER — POTASSIUM CHLORIDE CRYS ER 20 MEQ PO TBCR
30.0000 meq | EXTENDED_RELEASE_TABLET | Freq: Once | ORAL | Status: AC
  Administered 2014-04-14: 30 meq via ORAL
  Filled 2014-04-14 (×2): qty 1

## 2014-04-14 MED ORDER — DEXTROSE 50 % IV SOLN
INTRAVENOUS | Status: AC
  Filled 2014-04-14: qty 50

## 2014-04-14 MED ORDER — LORATADINE 10 MG PO TABS
10.0000 mg | ORAL_TABLET | Freq: Every day | ORAL | Status: DC
  Administered 2014-04-14 – 2014-04-23 (×10): 10 mg via ORAL
  Filled 2014-04-14 (×10): qty 1

## 2014-04-14 MED ORDER — ONDANSETRON HCL 4 MG/2ML IJ SOLN
4.0000 mg | Freq: Four times a day (QID) | INTRAMUSCULAR | Status: DC | PRN
  Administered 2014-04-14 – 2014-04-19 (×4): 4 mg via INTRAVENOUS
  Filled 2014-04-14: qty 2

## 2014-04-14 MED ORDER — OXYCODONE HCL 5 MG PO TABS
5.0000 mg | ORAL_TABLET | ORAL | Status: DC | PRN
  Administered 2014-04-14 – 2014-04-23 (×14): 5 mg via ORAL
  Filled 2014-04-14 (×13): qty 1

## 2014-04-14 MED ORDER — DEXTROSE 50 % IV SOLN
INTRAVENOUS | Status: AC
  Administered 2014-04-14: 22 mL
  Filled 2014-04-14: qty 50

## 2014-04-14 MED ORDER — INSULIN GLARGINE 100 UNIT/ML ~~LOC~~ SOLN
3.0000 [IU] | Freq: Two times a day (BID) | SUBCUTANEOUS | Status: DC
  Administered 2014-04-14: 3 [IU] via SUBCUTANEOUS
  Filled 2014-04-14 (×2): qty 0.03

## 2014-04-14 MED ORDER — DEXTROSE-NACL 5-0.45 % IV SOLN
INTRAVENOUS | Status: DC
  Administered 2014-04-14: 02:00:00 via INTRAVENOUS

## 2014-04-14 MED ORDER — INSULIN ASPART 100 UNIT/ML ~~LOC~~ SOLN
0.0000 [IU] | SUBCUTANEOUS | Status: DC
  Administered 2014-04-14: 7 [IU] via SUBCUTANEOUS
  Administered 2014-04-14: 1 [IU] via SUBCUTANEOUS

## 2014-04-14 MED ORDER — SALINE SPRAY 0.65 % NA SOLN
1.0000 | NASAL | Status: DC | PRN
  Filled 2014-04-14: qty 44

## 2014-04-14 MED ORDER — INSULIN GLARGINE 100 UNIT/ML ~~LOC~~ SOLN
5.0000 [IU] | Freq: Two times a day (BID) | SUBCUTANEOUS | Status: DC
  Administered 2014-04-14: 5 [IU] via SUBCUTANEOUS
  Filled 2014-04-14 (×2): qty 0.05

## 2014-04-14 MED ORDER — LIDOCAINE HCL 1 % IJ SOLN
INTRAMUSCULAR | Status: AC
  Filled 2014-04-14: qty 20

## 2014-04-14 NOTE — Procedures (Signed)
R IJ SL PICC Tip mid RA No complication No blood loss. See complete dictation in [facility].

## 2014-04-14 NOTE — Progress Notes (Signed)
Pt came back to floor from radiology and central line placed and ready to use per radiology. Pt's lunch warmed up and pt tried to eat but felt nauseated and vomited a large amount of liquid up which was red and orange which reflected color of food she ate. Pt stated the feeling of N&V just came over her suddenly. Pt given Zofran per md orders. Pt's vitals within normal limits. Pt's blood sugar within normal limits. Pt now is attempting to eat some food slowly. Will monitor.

## 2014-04-14 NOTE — Progress Notes (Signed)
Hypoglycemic Event  CBG: 57  Treatment: D50 17 ml per glucostabilizer  Symptoms: none  Follow-up CBG: Time: 0330  CBG Result: 46  Possible Reasons for Event: pt admitted with DKA on glucostabilizer protocol  Comments/MD notified:0334 CBG 46 22 ml D50 per glucostabilizer given                                       0353 CBG 55 18 ml D50 per glucostabilizer given                                       0413 CBG 54 18 ml D50 per glucostabilizer given                                       3013 CBG 76     Zackarie Chason J  Remember to initiate Hypoglycemia Order Set & complete

## 2014-04-14 NOTE — Progress Notes (Signed)
Pt is still off floor in radiology.

## 2014-04-14 NOTE — Progress Notes (Signed)
  [facility] Progress Note   Subjective: 4kg off w HD yest, no SOB today  Filed Vitals:   04/14/14 0002 04/14/14 0300 04/14/14 0400 04/14/14 0755  BP: 111/53 99/49 117/59 117/51  Pulse: 78 74 74 75  Temp: 98.5 F (36.9 C)  99.4 F (37.4 C) 98.6 F (37 C)  TempSrc: Oral  Axillary Oral  Resp: 17 17 14 21   Height:      Weight:      SpO2: 99% 98% 99% 98%   Exam: Alert, no distress, calm Chest rales R base, L base dec'd BS and rales RRR 2/6 soft SEM no RG Abd soft, NTND no ascites No LE or UE edema, no gangrene or leg wounds Neuro is nf, Ox 3 R thigh AVG +bruit  HD: TTS NW F160 3h 10min, 68.5kh 2/2.5 Bath R thigh AVG Heparin none Hect 2 ug TIW, Aranesp 25 mcg weekly, Venofer 50/wk maint  Assessment: 1 Resp failure / hypoxemia - d/t vol overload primarily, resolved 2 Possible PNA 3 Hyperkalemia, resolved 4 ESRD  5 DM 1 6 Hx failed KP tx , 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 7 Sec HPTH cont vit D, phoslo 8 HTN not on BP meds per home med list    Plan- HD tomorrow, repeat CXR today    Kelly Splinter MD  pager [phone]    cell [phone]  04/14/2014, 10:16 AM     Recent Labs Lab 04/13/14 1631 04/14/14 0208 04/14/14 0707  NA 133* 137 132*  K 4.3 3.4* 4.0  CL 88* 94* 92*  CO2 15* 24 23  GLUCOSE 355* 111* 90  BUN 21 30* 31*  CREATININE 5.83* 6.60* 7.42*  CALCIUM 8.9 8.9 8.7    Recent Labs Lab 04/13/14 0752  AST 13  ALT 8  ALKPHOS 115  BILITOT 0.7  PROT 7.9  ALBUMIN 3.5    Recent Labs Lab 04/13/14 0752 04/13/14 0802 04/13/14 1631 04/14/14 0208  WBC 9.4  --  9.8 7.4  NEUTROABS 8.3*  --   --   --   HGB 10.3* 12.6 9.9* 9.0*  HCT 32.7* 37.0 30.8* 27.6*  MCV 100.3*  --  96.6 94.8  PLT 169  --  153 195   . aspirin EC  81 mg Oral Daily  . calcium acetate  2,001 mg Oral TID WC  . [START ON 04/15/2014] darbepoetin (ARANESP) injection - DIALYSIS  25 mcg Intravenous Q Thu-HD  . dextrose      . doxercalciferol  2 mcg Intravenous Q T,Th,Sa-HD   . [START ON 04/15/2014] ferric gluconate (FERRLECIT/NULECIT) IV  62.5 mg Intravenous Q Thu-HD  . gabapentin  100 mg Oral Daily  . heparin  5,000 Units Subcutaneous 3 times per day  . insulin aspart  0-9 Units Subcutaneous 6 times per day  . loratadine  10 mg Oral Daily  . montelukast  10 mg Oral QHS  . multivitamin  1 tablet Oral Daily  . pantoprazole  40 mg Oral QHS  . piperacillin-tazobactam (ZOSYN)  IV  2.25 g Intravenous 3 times per day  . sodium chloride  3 mL Intravenous Q12H  . vancomycin  750 mg Intravenous Q T,Th,Sa-HD   . dextrose 50 mL/hr at 04/14/14 0620   sodium chloride, sodium chloride, acetaminophen **OR** acetaminophen, albuterol, clonazePAM, feeding supplement (NEPRO CARB STEADY), heparin, lidocaine (PF), lidocaine-prilocaine, ondansetron, oxyCODONE, pentafluoroprop-tetrafluoroeth, sodium chloride

## 2014-04-14 NOTE — Progress Notes (Signed)
Pt is leaving floor to go to radiology to have central line placed per md orders.

## 2014-04-14 NOTE — Progress Notes (Signed)
Pt is sleeping after receiving zofran. Will monitor.

## 2014-04-14 NOTE — Progress Notes (Addendum)
Hypoglycemic Event  CBG: 57  Treatment: 17 ml D50 given per Stabilizer   Symptoms: none  Follow-up CBG: [id number] CBG Result:54  Possible Reasons for Event: Pt admitted with DKA and on stabilizer protocol.  Comments/MD notified:gave 18 ml D50 per stabilizer; notified NP. Rechecked CBG at 0651 CBG 92.  D50 @ 50 started and pt drank 120 of orange juice and 120 ginger ale.    Hartley Barefoot  Remember to initiate Hypoglycemia Order Set & complete

## 2014-04-14 NOTE — Progress Notes (Signed)
Inpatient Diabetes Program Recommendations  AACE/ADA: New Consensus Statement on Inpatient Glycemic Control (2013)  Target Ranges:  Prepandial:   less than 140 mg/dL      Peak postprandial:   less than 180 mg/dL (1-2 hours)      Critically ill patients:  140 - 180 mg/dL   No basal insulin ordered yet. Pt is type 1 and needs her basal insulin now. Please see recommendations below:  Inpatient Diabetes Program Recommendations Insulin - Basal: Pt off IV insulin per GS with cbg of 93 at 0743 this am.  No further documentation of glucose levels. No basal insulin ordered thus far. Pt takes 7 units bid at home. Pt needs at least 6 units bid here and needs it asap as IV insulin half-life is approximately 7-10 mins. Correction (SSI): Use Sensitive correction tidwc.at this point. Insulin - Meal Coverage: Would not order yet (takes 6 units novolog bid at home) Thank you, Rosita Kea, RN, CNS, Diabetes Coordinator [phone])

## 2014-04-14 NOTE — Progress Notes (Signed)
[facility] [place] [facility]  BUFFI EWTON IPJ:[id number] DOB: 1970-10-08 DOA: 04/13/2014 PCP: [name], MD  Brief narrative: 43 year old female patient with known type 1 diabetes, failed kidney/pancreas transplant, chronic kidney disease on dialysis. Patient has a known history of repeated hospitalizations for complications from nonadherence to medical therapies prescribed. She reports missing dialysis on the Saturday before admission because her daughter was in an automobile accident. By the day of admission she had developed shortness of breath with cough that was productive but she denied fevers chills or chest discomfort.  She presented to the ER where chest x-ray revealed edema with possible pneumonia. Her O2 sats were 70 percent on room air. She was quite hyperglycemic with a glucose of 700 and an anion gap of 30. She was started on an insulin infusion while in the emergency department.  She has undergone dialysis on the date of admission with a total of 3980 mL removed with ultrafiltration. Her insulin drip has been discontinued in the setting of hypoglycemia that is now requiring dextrose 10 infusion. She did have persistent hypoglycemia with CBGs in the 40-50 range on dextrose 10 until around 8:00 am on 11/18 when CBG did increase to 93 and after diet was introduced CBGs increased at 346. Her anion gap was elevated at 17 but this is not unusual for her in the setting of her chronic kidney disease.  Follow-up chest x-ray postdialysis did not demonstrate any evidence of a pneumonia process. Blood cultures are pending and concerns were that given her presentation with DKA with subsequent persistent hypoglycemia that patient may have occult sepsis process. No convincing evidence she has pneumonia though and it was felt that her respiratory symptoms were from volume overload from missed dialysis.  HPI/Subjective: Alert and complaining of frontal  headache. Denies wounds or productive cough.  Assessment/Plan:    Acute respiratory failure with hypoxia due to volume overload -> 3900 cc removed via UF 11/17 - no clinical evidence to suggest PNA     ESRD (end stage renal disease) -per Nephro  -cont HD    DKA (diabetic ketoacidoses) -CBG now > 300 on D10 -dc D10 -transition to Lantus but at 1/2 home dose since she is usually very brittle -cont sensitive SSI    Hypoglycemia -onset after admit -CBG now > 300 so dc D10 -has a well established hx of  brittle DM    Hyperlipidemia    GERD/Gastroparesis -cont PPI    RLS (restless legs syndrome)    DVT prophylaxis: subcutaneous heparin Code Status: full Family Communication: no family at bedside Disposition Plan/Expected LOS: remaining stepdown due to labile CBGs and risk for severe symptomatic hypoglycemia  Consultants: Nephrology/ Dr Jonnie Finner  Procedures: none  Antibiotics: Zosyn 11/17 > 11/18 Vancomycin 11/17 > 11/18  Objective: Blood pressure 125/60, pulse 81, temperature 98.6 F (37 C), temperature source Oral, resp. rate 15, height 5\' 1"  (1.549 m), weight 67.4 kg (148 lb 9.4 oz), last menstrual period 03/26/2014, SpO2 98 %.  Intake/Output Summary (Last 24 hours) at 04/14/14 1934 Last data filed at 04/14/14 1200  Gross per 24 hour  Intake 1088.1 ml  Output      0 ml  Net 1088.1 ml    Exam: Gen: No acute respiratory distress Chest: Clear to auscultation bilaterally without wheezes, rhonchi or crackles,2L Cardiac: Regular rate and rhythm, S1-S2, no rubs murmurs or gallops, no peripheral edema, no JVD Abdomen: Soft nontender nondistended without obvious hepatosplenomegaly, no ascites Extremities:  Symmetrical in appearance without cyanosis, clubbing or effusion  Scheduled Meds:  Scheduled Meds: . aspirin EC  81 mg Oral Daily  . calcium acetate  2,001 mg Oral TID WC  . [START ON 04/15/2014] darbepoetin (ARANESP) injection - DIALYSIS  25 mcg Intravenous Q  Thu-HD  . doxercalciferol  2 mcg Intravenous Q T,Th,Sa-HD  . [START ON 04/15/2014] ferric gluconate (FERRLECIT/NULECIT) IV  62.5 mg Intravenous Q Thu-HD  . gabapentin  100 mg Oral Daily  . heparin  5,000 Units Subcutaneous 3 times per day  . insulin aspart  0-9 Units Subcutaneous 6 times per day  . insulin glargine  3 Units Subcutaneous BID  . lidocaine      . loratadine  10 mg Oral Daily  . montelukast  10 mg Oral QHS  . multivitamin  1 tablet Oral Daily  . pantoprazole  40 mg Oral QHS  . sodium chloride  3 mL Intravenous Q12H    Data Reviewed: Basic Metabolic Panel:  Recent Labs Lab 04/13/14 0752 04/13/14 0802 04/13/14 1631 04/14/14 0208 04/14/14 0707  NA 127* 126* 133* 137 132*  K 6.8* 6.7* 4.3 3.4* 4.0  CL 85* 98 88* 94* 92*  CO2 14*  --  15* 24 23  GLUCOSE 791* >700* 355* 111* 90  BUN 77* 77* 21 30* 31*  CREATININE 14.79* 16.00* 5.83* 6.60* 7.42*  CALCIUM 8.4  --  8.9 8.9 8.7   Liver Function Tests:  Recent Labs Lab 04/13/14 0752  AST 13  ALT 8  ALKPHOS 115  BILITOT 0.7  PROT 7.9  ALBUMIN 3.5   CBC:  Recent Labs Lab 04/13/14 0752 04/13/14 0802 04/13/14 1631 04/14/14 0208  WBC 9.4  --  9.8 7.4  NEUTROABS 8.3*  --   --   --   HGB 10.3* 12.6 9.9* 9.0*  HCT 32.7* 37.0 30.8* 27.6*  MCV 100.3*  --  96.6 94.8  PLT 169  --  153 195   CBG:  Recent Labs Lab 04/14/14 0625 04/14/14 0650 04/14/14 0754 04/14/14 1213 04/14/14 1520  GLUCAP 55* 92 93 346* 144*    Recent Results (from the past 240 hour(s))  Blood culture (routine x 2)     Status: None (Preliminary result)   Collection Time: 04/13/14  8:25 AM  Result Value Ref Range Status   Specimen Description BLOOD RIGHT HAND  Final   Special Requests BOTTLES DRAWN AEROBIC ONLY 2MLS  Final   Culture  Setup Time   Final    04/13/2014 14:04 Performed at [facility]    Culture   Final           BLOOD CULTURE RECEIVED NO GROWTH TO DATE CULTURE WILL BE HELD FOR 5 DAYS BEFORE ISSUING A FINAL  NEGATIVE REPORT Performed at [facility]    Report Status PENDING  Incomplete  Blood culture (routine x 2)     Status: None (Preliminary result)   Collection Time: 04/13/14  8:35 AM  Result Value Ref Range Status   Specimen Description BLOOD RIGHT NECK  Final   Special Requests   Final    BOTTLES DRAWN AEROBIC AND ANAEROBIC 5MLS RIGHT EJ LINES    Culture  Setup Time   Final    04/13/2014 14:04 Performed at [facility]    Culture   Final           BLOOD CULTURE RECEIVED NO GROWTH TO DATE CULTURE WILL BE HELD FOR 5 DAYS BEFORE ISSUING A FINAL NEGATIVE REPORT  Performed at [facility]    Report Status PENDING  Incomplete  MRSA PCR Screening     Status: None   Collection Time: 04/13/14  3:32 PM  Result Value Ref Range Status   MRSA by PCR NEGATIVE NEGATIVE Final    Comment:        The GeneXpert MRSA Assay (FDA approved for NASAL specimens only), is one component of a comprehensive MRSA colonization surveillance program. It is not intended to diagnose MRSA infection nor to guide or monitor treatment for MRSA infections.      Studies:  Recent x-ray studies have been reviewed in detail by the Attending Physician  Time spent :  [place], ANP [facility] Office  [phone] Pager [phone]  On-Call/Text Page:      [email or web address]      password [email or web address]  If 7PM-7AM, please contact night-coverage [email or web address] Password [email or web address] 04/14/2014, 7:34 PM   LOS: 1 day   I have personally examined this patient and reviewed the entire database. I have reviewed the above note, made any necessary editorial changes, and agree with its content.  Cherene Altes, MD [facility]

## 2014-04-14 NOTE — Progress Notes (Signed)
Pt just left for radiology. Central telemetry notified.

## 2014-04-14 NOTE — Plan of Care (Signed)
Problem: Phase I Progression Outcomes Goal: CBGs steadily decreasing on IV insulin drip Outcome: Completed/Met Date Met:  04/14/14 Goal: Monitor hydration status Outcome: Completed/Met Date Met:  04/14/14 Goal: Acidosis resolving Outcome: Completed/Met Date Met:  04/14/14 Goal: NPO or per MD order Outcome: Completed/Met Date Met:  04/14/14 Goal: K+ level approaching normal with therapy Outcome: Completed/Met Date Met:  04/14/14 Goal: Nausea/vomiting controlled with antiemetics Outcome: Completed/Met Date Met:  04/14/14 Goal: Pain controlled with appropriate interventions Outcome: Progressing Goal: OOB as tolerated unless otherwise ordered Outcome: Completed/Met Date Met:  04/14/14 Goal: Initial discharge plan identified Outcome: Progressing

## 2014-04-15 DIAGNOSIS — G2581 Restless legs syndrome: Secondary | ICD-10-CM | POA: Insufficient documentation

## 2014-04-15 DIAGNOSIS — E785 Hyperlipidemia, unspecified: Secondary | ICD-10-CM | POA: Insufficient documentation

## 2014-04-15 DIAGNOSIS — Z992 Dependence on renal dialysis: Secondary | ICD-10-CM

## 2014-04-15 DIAGNOSIS — N186 End stage renal disease: Secondary | ICD-10-CM | POA: Insufficient documentation

## 2014-04-15 LAB — GLUCOSE, CAPILLARY
GLUCOSE-CAPILLARY: 109 mg/dL — AB (ref 70–99)
GLUCOSE-CAPILLARY: 125 mg/dL — AB (ref 70–99)
GLUCOSE-CAPILLARY: 133 mg/dL — AB (ref 70–99)
GLUCOSE-CAPILLARY: 213 mg/dL — AB (ref 70–99)
GLUCOSE-CAPILLARY: 239 mg/dL — AB (ref 70–99)
GLUCOSE-CAPILLARY: 434 mg/dL — AB (ref 70–99)
GLUCOSE-CAPILLARY: 83 mg/dL (ref 70–99)
GLUCOSE-CAPILLARY: 93 mg/dL (ref 70–99)
Glucose-Capillary: 129 mg/dL — ABNORMAL HIGH (ref 70–99)
Glucose-Capillary: 140 mg/dL — ABNORMAL HIGH (ref 70–99)
Glucose-Capillary: 160 mg/dL — ABNORMAL HIGH (ref 70–99)
Glucose-Capillary: 183 mg/dL — ABNORMAL HIGH (ref 70–99)
Glucose-Capillary: 544 mg/dL — ABNORMAL HIGH (ref 70–99)
Glucose-Capillary: 96 mg/dL (ref 70–99)
Glucose-Capillary: >600 mg/dL (ref 70–99)
Glucose-Capillary: >600 mg/dL (ref 70–99)

## 2014-04-15 LAB — RENAL FUNCTION PANEL
ALBUMIN: 2.5 g/dL — AB (ref 3.5–5.2)
Anion gap: 26 — ABNORMAL HIGH (ref 5–15)
BUN: 48 mg/dL — ABNORMAL HIGH (ref 6–23)
CALCIUM: 7.9 mg/dL — AB (ref 8.4–10.5)
CO2: 16 meq/L — AB (ref 19–32)
CREATININE: 8.7 mg/dL — AB (ref 0.50–1.10)
Chloride: 85 mEq/L — ABNORMAL LOW (ref 96–112)
GFR calc Af Amer: 6 mL/min — ABNORMAL LOW (ref >90)
GFR, EST NON AFRICAN AMERICAN: 5 mL/min — AB (ref >90)
Glucose, Bld: 765 mg/dL (ref 70–99)
Phosphorus: 3.3 mg/dL (ref 2.3–4.6)
Potassium: 5.3 mEq/L (ref 3.7–5.3)
Sodium: 127 mEq/L — ABNORMAL LOW (ref 137–147)

## 2014-04-15 LAB — BASIC METABOLIC PANEL
ANION GAP: 15 (ref 5–15)
ANION GAP: 16 — AB (ref 5–15)
BUN: 12 mg/dL (ref 6–23)
BUN: 14 mg/dL (ref 6–23)
CALCIUM: 9 mg/dL (ref 8.4–10.5)
CALCIUM: 8.9 mg/dL (ref 8.4–10.5)
CO2: 27 meq/L (ref 19–32)
CO2: 25 mEq/L (ref 19–32)
CREATININE: 3.78 mg/dL — AB (ref 0.50–1.10)
CREATININE: 4.88 mg/dL — AB (ref 0.50–1.10)
Chloride: 94 mEq/L — ABNORMAL LOW (ref 96–112)
Chloride: 94 mEq/L — ABNORMAL LOW (ref 96–112)
GFR calc Af Amer: 16 mL/min — ABNORMAL LOW (ref >90)
GFR calc Af Amer: 12 mL/min — ABNORMAL LOW (ref >90)
GFR calc non Af Amer: 10 mL/min — ABNORMAL LOW (ref >90)
GFR, EST NON AFRICAN AMERICAN: 14 mL/min — AB (ref >90)
Glucose, Bld: 128 mg/dL — ABNORMAL HIGH (ref 70–99)
Glucose, Bld: 242 mg/dL — ABNORMAL HIGH (ref 70–99)
Potassium: 3.6 mEq/L — ABNORMAL LOW (ref 3.7–5.3)
Potassium: 3.5 mEq/L — ABNORMAL LOW (ref 3.7–5.3)
Sodium: 136 mEq/L — ABNORMAL LOW (ref 137–147)
Sodium: 135 mEq/L — ABNORMAL LOW (ref 137–147)

## 2014-04-15 LAB — CBC
HCT: 27.1 % — ABNORMAL LOW (ref 36.0–46.0)
Hemoglobin: 8.5 g/dL — ABNORMAL LOW (ref 12.0–15.0)
MCH: 30.8 pg (ref 26.0–34.0)
MCHC: 31.4 g/dL (ref 30.0–36.0)
MCV: 98.2 fL (ref 78.0–100.0)
Platelets: 158 10*3/uL (ref 150–400)
RBC: 2.76 MIL/uL — ABNORMAL LOW (ref 3.87–5.11)
RDW: 14.6 % (ref 11.5–15.5)
WBC: 5.5 10*3/uL (ref 4.0–10.5)

## 2014-04-15 LAB — LIPID PANEL
Cholesterol: 166 mg/dL (ref 0–200)
HDL: 72 mg/dL (ref >39)
LDL CALC: 74 mg/dL (ref 0–99)
Total CHOL/HDL Ratio: 2.3 RATIO
Triglycerides: 98 mg/dL (ref <150)
VLDL: 20 mg/dL (ref 0–40)

## 2014-04-15 LAB — MAGNESIUM: Magnesium: 1.9 mg/dL (ref 1.5–2.5)

## 2014-04-15 LAB — GLUCOSE, RANDOM: Glucose, Bld: 690 mg/dL (ref 70–99)

## 2014-04-15 MED ORDER — PENTAFLUOROPROP-TETRAFLUOROETH EX AERO
INHALATION_SPRAY | CUTANEOUS | Status: AC
  Filled 2014-04-15: qty 103.5

## 2014-04-15 MED ORDER — DEXTROSE-NACL 5-0.45 % IV SOLN
INTRAVENOUS | Status: DC
Start: 2014-04-15 — End: 2014-04-15
  Administered 2014-04-15: 18:00:00 via INTRAVENOUS

## 2014-04-15 MED ORDER — INSULIN GLARGINE 100 UNIT/ML ~~LOC~~ SOLN
8.0000 [IU] | Freq: Two times a day (BID) | SUBCUTANEOUS | Status: DC
  Filled 2014-04-15: qty 0.08

## 2014-04-15 MED ORDER — INSULIN REGULAR HUMAN 100 UNIT/ML IJ SOLN
INTRAMUSCULAR | Status: DC
  Administered 2014-04-15: 0.1 [IU]/h via INTRAVENOUS
  Administered 2014-04-15: 0.8 [IU]/h via INTRAVENOUS
  Administered 2014-04-15: 4.8 [IU]/h via INTRAVENOUS
  Filled 2014-04-15: qty 2.5

## 2014-04-15 MED ORDER — ONDANSETRON HCL 4 MG/2ML IJ SOLN
INTRAMUSCULAR | Status: AC
  Administered 2014-04-15: 4 mg via INTRAVENOUS
  Filled 2014-04-15: qty 2

## 2014-04-15 MED ORDER — INSULIN ASPART 100 UNIT/ML ~~LOC~~ SOLN
15.0000 [IU] | Freq: Once | SUBCUTANEOUS | Status: AC
  Administered 2014-04-15: 15 [IU] via SUBCUTANEOUS

## 2014-04-15 MED ORDER — HYDROXYZINE HCL 25 MG PO TABS
25.0000 mg | ORAL_TABLET | Freq: Four times a day (QID) | ORAL | Status: DC | PRN
  Administered 2014-04-15 – 2014-04-21 (×2): 25 mg via ORAL
  Filled 2014-04-15 (×3): qty 1

## 2014-04-15 MED ORDER — DOXERCALCIFEROL 4 MCG/2ML IV SOLN
INTRAVENOUS | Status: AC
  Administered 2014-04-15: 2 ug via INTRAVENOUS
  Filled 2014-04-15: qty 2

## 2014-04-15 MED ORDER — INSULIN GLARGINE 100 UNIT/ML ~~LOC~~ SOLN
5.0000 [IU] | Freq: Every day | SUBCUTANEOUS | Status: DC
  Administered 2014-04-15: 5 [IU] via SUBCUTANEOUS
  Filled 2014-04-15: qty 0.05

## 2014-04-15 MED ORDER — DARBEPOETIN ALFA 60 MCG/0.3ML IJ SOSY
60.0000 ug | PREFILLED_SYRINGE | INTRAMUSCULAR | Status: DC
  Administered 2014-04-15: 60 ug via INTRAVENOUS

## 2014-04-15 MED ORDER — HYDROXYZINE HCL 25 MG PO TABS
ORAL_TABLET | ORAL | Status: AC
  Administered 2014-04-15: 25 mg via ORAL
  Filled 2014-04-15: qty 1

## 2014-04-15 MED ORDER — SODIUM CHLORIDE 0.9 % IV SOLN
INTRAVENOUS | Status: DC
  Administered 2014-04-15 – 2014-04-19 (×3): via INTRAVENOUS

## 2014-04-15 MED ORDER — DARBEPOETIN ALFA 60 MCG/0.3ML IJ SOSY
PREFILLED_SYRINGE | INTRAMUSCULAR | Status: AC
  Administered 2014-04-15: 60 ug via INTRAVENOUS
  Filled 2014-04-15: qty 0.3

## 2014-04-15 MED ORDER — DARBEPOETIN ALFA 25 MCG/0.42ML IJ SOSY
PREFILLED_SYRINGE | INTRAMUSCULAR | Status: AC
  Filled 2014-04-15: qty 0.42

## 2014-04-15 NOTE — Plan of Care (Signed)
Problem: Consults Goal: Skin Care Protocol Initiated - if Braden Score 18 or less If consults are not indicated, leave blank or document N/A  Outcome: Progressing Goal: Diabetes Guidelines if Diabetic/Glucose > 140 If diabetic or lab glucose is > 140 mg/dl - Initiate Diabetes/Hyperglycemia Guidelines & Document Interventions  Outcome: Progressing

## 2014-04-15 NOTE — Progress Notes (Signed)
CRITICAL VALUE ALERT  Critical value received: Blood Glucose 690  Date of notification: 04/15/2014   Time of notification:  2:12 AM  Critical value read back: yes  Nurse who received alert:  Leary Roca RN  MD notified (1st page):  Forrest Moron NP  Time of first page:  0212  MD notified (2nd page):  Time of second page:  Responding MD:    Time MD responded:  [phone]

## 2014-04-15 NOTE — Progress Notes (Signed)
Sugars high again. Gap open and CO2 low. D/C all present insulin and start insulin drip/glucose stabilizer. BMP q4 x 4.  Limited IVF secondary to ESRD/HD.

## 2014-04-15 NOTE — Progress Notes (Signed)
Subjective:  On hd , tolerating uf so far/ no sob, weak /nausea /  Tells me was to have Femoral Shuntogram yesterday as outpt at  [facility] sec to swelling in leg   Objective Vital signs in last 24 hours: Filed Vitals:   04/15/14 0900 04/15/14 0930 04/15/14 1000 04/15/14 1030  BP: 114/63 118/62 115/65 123/67  Pulse: 75 81 78 76  Temp:      TempSrc:      Resp: 12 13 13 11   Height:      Weight:      SpO2: 100% 100% 100% 100%   Weight change:   Physical Exam: General:alert, nad, on hd Resp =rales R base, L base dec'd BS and rales  CArd=RRR 2/6 soft SEM no RG Abd =soft, NTND no ascites  Extrem=min  R leg edema upper thigh  HD Access=R thigh AVG patent on hd  With Projected kt/v  At 2.0  HD: TTS NW F160 3h 73min, 68.5kh 2/2.5 Bath R thigh AVG Heparin none Hect 2 ug TIW, Aranesp 25 mcg weekly, Venofer 50/wk maint  Assessment: 1. Resp failure / hypoxemia/ Pul .edema - Improved with uf on HD 2. ID - AB stopped and CXR cleared with fluid removal 3. Hyperkalemia/DM  Type 1 = BS ^ 600's this am  Admit team RX 4. ESRD TTS (normal schedule)/ ck op records concerning need for shuntogram 5. 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 Anemia= Hgb 8.5 on esa 59mcg  ^ 23mcg/ weekly fe on hd 7. Sec HPTH cont vit D, phoslo 8. HTN not on BP meds per home med list  Ernest Haber, PA-C [facility] Beeper [phone] 04/15/2014,10:54 AM  LOS: 2 days   Pt seen, examined and agree w A/P as above.  Kelly Splinter MD pager [phone]    cell [phone] 04/15/2014, 11:33 AM    Labs: Basic Metabolic Panel:  Recent Labs Lab 04/14/14 0208 04/14/14 0707 04/15/14 0057 04/15/14 0405  NA 137 132*  --  127*  K 3.4* 4.0  --  5.3  CL 94* 92*  --  85*  CO2 24 23  --  16*  GLUCOSE 111* 90 690* 765*  BUN 30* 31*  --  48*  CREATININE 6.60* 7.42*  --  8.70*  CALCIUM 8.9 8.7  --  7.9*  PHOS  --   --   --  3.3   Liver Function Tests:  Recent Labs Lab 04/13/14 0752 04/15/14 0405   AST 13  --   ALT 8  --   ALKPHOS 115  --   BILITOT 0.7  --   PROT 7.9  --   ALBUMIN 3.5 2.5*  CBC:  Recent Labs Lab 04/13/14 0752  04/13/14 1631 04/14/14 0208 04/15/14 0405  WBC 9.4  --  9.8 7.4 5.5  NEUTROABS 8.3*  --   --   --   --   HGB 10.3*  < > 9.9* 9.0* 8.5*  HCT 32.7*  < > 30.8* 27.6* 27.1*  MCV 100.3*  --  96.6 94.8 98.2  PLT 169  --  153 195 158  < > = values in this interval not displayed. CBG:  Recent Labs Lab 04/15/14 0507 04/15/14 0616 04/15/14 0713 04/15/14 0832 04/15/14 1002  GLUCAP >600* 544* 434* 140* 83     Medications: . sodium chloride 10 mL/hr at 04/15/14 0530  . dextrose 5 % and 0.45% NaCl    . insulin (NOVOLIN-R) infusion 0.1 Units/hr (04/15/14 1010)   .  aspirin EC  81 mg Oral Daily  . calcium acetate  2,001 mg Oral TID WC  . darbepoetin (ARANESP) injection - DIALYSIS  25 mcg Intravenous Q Thu-HD  . doxercalciferol  2 mcg Intravenous Q T,Th,Sa-HD  . ferric gluconate (FERRLECIT/NULECIT) IV  62.5 mg Intravenous Q Thu-HD  . gabapentin  100 mg Oral Daily  . heparin  5,000 Units Subcutaneous 3 times per day  . loratadine  10 mg Oral Daily  . montelukast  10 mg Oral QHS  . multivitamin  1 tablet Oral Daily  . pantoprazole  40 mg Oral QHS  . pentafluoroprop-tetrafluoroeth      . sodium chloride  3 mL Intravenous Q12H

## 2014-04-15 NOTE — Procedures (Signed)
I was present at this dialysis session, have reviewed the session itself and made  appropriate changes  Kelly Splinter MD (pgr) [phone]    [phone] 04/15/2014, 11:33 AM

## 2014-04-15 NOTE — Progress Notes (Signed)
[facility]  [name]:[id number] DOB: January 26, 1971 DOA: 04/13/2014 PCP: [name], MD  Brief narrative: 43 yo BF PMHx Type 1 diabetes, failed kidney/pancreas transplant, chronic kidney disease on dialysis. Patient has a known history of repeated hospitalizations for complications from nonadherence to medical therapies prescribed. She reports missing dialysis on the Saturday before admission because her daughter was in an automobile accident. By the day of admission she had developed shortness of breath with cough that was productive but she denied fevers chills or chest discomfort.  She presented to the ER where chest x-ray revealed edema with possible pneumonia. Her O2 sats were 70 percent on room air. She was quite hyperglycemic with a glucose of 700 and an anion gap of 30. She was started on an insulin infusion while in the emergency department.  She has undergone dialysis on the date of admission with a total of 3980 mL removed with ultrafiltration. Her insulin drip has been discontinued in the setting of hypoglycemia that is now requiring dextrose 10 infusion. She did have persistent hypoglycemia with CBGs in the 40-50 range on dextrose 10 until around 8:00 am on 11/18 when CBG did increase to 93 and after diet was introduced CBGs increased at 346. Her anion gap was elevated at 17 but this is not unusual for her in the setting of her chronic kidney disease.  Follow-up chest x-ray postdialysis did not demonstrate any evidence of a pneumonia process. Blood cultures are pending and concerns were that given her presentation with DKA with subsequent persistent hypoglycemia that patient may have occult sepsis process. No convincing evidence she has pneumonia though and it was felt that her respiratory symptoms were from volume overload from missed dialysis.  HPI/Subjective: Examined in HD- frontal HA better  Assessment/Plan:    Acute  respiratory failure with hypoxia due to volume overload -> 3900 cc removed via UF 11/17 - no clinical evidence to suggest PNA  -remains on oxygen- dry nares so add humidification    ESRD (end stage renal disease) -per Nephro  -cont HD    DKA (diabetic ketoacidoses) and hypoglycemia -onset after admit and required D10 -11/18  CBG was > 300 on D10 so dc'd D10 and was transitioned to Lantus but at 1/2 home dose since she is usually very brittle -overnight into 11/19 went back into DKA (glucose 765, AG 26) so back on insulin drip and plan to continue for add'l 24 hrs and follow closely -has a well established hx of  brittle DM -Will obtain BMP to determine the patient's DKA has resolved, and if within normal limits transition off insulin drip and allow patient to eat. Explained in detail to patient are plan of care.    Hyperlipidemia -Obtain lipid panel    GERD/Gastroparesis -cont PPI    RLS (restless legs syndrome)      DVT prophylaxis: subcutaneous heparin Code Status: full Family Communication: no family at bedside Disposition Plan/Expected LOS: remaining stepdown due to labile CBGs and risk for severe symptomatic hypoglycemia/recurrent DKA  Consultants: Nephrology/ Dr Jonnie Finner  Procedures: none  Antibiotics: Zosyn 11/17 > 11/18 Vancomycin 11/17 > 11/18  Objective: Blood pressure 123/67, pulse 76, temperature 98.5 F (36.9 C), temperature source Oral, resp. rate 11, height 5\' 1"  (1.549 m), weight 148 lb 9.4 oz (67.4 kg), last menstrual period 03/26/2014, SpO2 100 %.  Intake/Output Summary (Last 24 hours) at 04/15/14 1101 Last data filed at 04/15/14 0400  Gross per  24 hour  Intake    690 ml  Output      0 ml  Net    690 ml    Exam: Gen: No acute respiratory distress Chest: Clear to auscultation bilaterally,2L Cardiac: Regular rate and rhythm, S1-S2, no rubs murmurs or gallops, no peripheral edema, no JVD Abdomen: Soft nontender nondistended without obvious  hepatosplenomegaly, no ascites Extremities: Symmetrical in appearance without cyanosis, clubbing or effusion  Scheduled Meds:  Scheduled Meds: . aspirin EC  81 mg Oral Daily  . calcium acetate  2,001 mg Oral TID WC  . darbepoetin (ARANESP) injection - DIALYSIS  25 mcg Intravenous Q Thu-HD  . doxercalciferol  2 mcg Intravenous Q T,Th,Sa-HD  . ferric gluconate (FERRLECIT/NULECIT) IV  62.5 mg Intravenous Q Thu-HD  . gabapentin  100 mg Oral Daily  . heparin  5,000 Units Subcutaneous 3 times per day  . loratadine  10 mg Oral Daily  . montelukast  10 mg Oral QHS  . multivitamin  1 tablet Oral Daily  . pantoprazole  40 mg Oral QHS  . pentafluoroprop-tetrafluoroeth      . sodium chloride  3 mL Intravenous Q12H    Data Reviewed: Basic Metabolic Panel:  Recent Labs Lab 04/13/14 0752 04/13/14 0802 04/13/14 1631 04/14/14 0208 04/14/14 0707 04/15/14 0057 04/15/14 0405  NA 127* 126* 133* 137 132*  --  127*  K 6.8* 6.7* 4.3 3.4* 4.0  --  5.3  CL 85* 98 88* 94* 92*  --  85*  CO2 14*  --  15* 24 23  --  16*  GLUCOSE 791* >700* 355* 111* 90 690* 765*  BUN 77* 77* 21 30* 31*  --  48*  CREATININE 14.79* 16.00* 5.83* 6.60* 7.42*  --  8.70*  CALCIUM 8.4  --  8.9 8.9 8.7  --  7.9*  PHOS  --   --   --   --   --   --  3.3   Liver Function Tests:  Recent Labs Lab 04/13/14 0752 04/15/14 0405  AST 13  --   ALT 8  --   ALKPHOS 115  --   BILITOT 0.7  --   PROT 7.9  --   ALBUMIN 3.5 2.5*   CBC:  Recent Labs Lab 04/13/14 0752 04/13/14 0802 04/13/14 1631 04/14/14 0208 04/15/14 0405  WBC 9.4  --  9.8 7.4 5.5  NEUTROABS 8.3*  --   --   --   --   HGB 10.3* 12.6 9.9* 9.0* 8.5*  HCT 32.7* 37.0 30.8* 27.6* 27.1*  MCV 100.3*  --  96.6 94.8 98.2  PLT 169  --  153 195 158   CBG:  Recent Labs Lab 04/15/14 0507 04/15/14 0616 04/15/14 0713 04/15/14 0832 04/15/14 1002  GLUCAP >600* 544* 434* 140* 83    Recent Results (from the past 240 hour(s))  Blood culture (routine x 2)      Status: None (Preliminary result)   Collection Time: 04/13/14  8:25 AM  Result Value Ref Range Status   Specimen Description BLOOD RIGHT HAND  Final   Special Requests BOTTLES DRAWN AEROBIC ONLY 2MLS  Final   Culture  Setup Time   Final    04/13/2014 14:04 Performed at [facility]    Culture   Final           BLOOD CULTURE RECEIVED NO GROWTH TO DATE CULTURE WILL BE HELD FOR 5 DAYS BEFORE ISSUING A FINAL NEGATIVE REPORT Performed at [facility]  Report Status PENDING  Incomplete  Blood culture (routine x 2)     Status: None (Preliminary result)   Collection Time: 04/13/14  8:35 AM  Result Value Ref Range Status   Specimen Description BLOOD RIGHT NECK  Final   Special Requests   Final    BOTTLES DRAWN AEROBIC AND ANAEROBIC 5MLS RIGHT EJ LINES    Culture  Setup Time   Final    04/13/2014 14:04 Performed at [facility]    Culture   Final           BLOOD CULTURE RECEIVED NO GROWTH TO DATE CULTURE WILL BE HELD FOR 5 DAYS BEFORE ISSUING A FINAL NEGATIVE REPORT Performed at [facility]    Report Status PENDING  Incomplete  MRSA PCR Screening     Status: None   Collection Time: 04/13/14  3:32 PM  Result Value Ref Range Status   MRSA by PCR NEGATIVE NEGATIVE Final    Comment:        The GeneXpert MRSA Assay (FDA approved for NASAL specimens only), is one component of a comprehensive MRSA colonization surveillance program. It is not intended to diagnose MRSA infection nor to guide or monitor treatment for MRSA infections.   Culture, routine-abscess     Status: None (Preliminary result)   Collection Time: 04/14/14  3:03 PM  Result Value Ref Range Status   Specimen Description ABSCESS LYMPH NODE  Final   Special Requests NONE  Final   Gram Stain   Final    RARE WBC PRESENT,BOTH PMN AND MONONUCLEAR NO SQUAMOUS EPITHELIAL CELLS SEEN NO ORGANISMS SEEN Performed at [facility]    Culture NO GROWTH Performed at [facility]   Final   Report Status PENDING  Incomplete     Studies:  Recent x-ray studies have been reviewed in detail by the Attending Physician  Time spent :  40 mins  Erin Hearing, ANP [facility] Office  [phone] Pager [phone]  On-Call/Text Page:      [email or web address]      password [email or web address]  If 7PM-7AM, please contact night-coverage [email or web address] Password [email or web address] 04/15/2014, 11:01 AM   LOS: 2 days   Examined patient and discussed assessment and plan with ANP Ebony Hail and agree with above. Patient with multiple complex medical problems> 40 minutes spent in direct patient care

## 2014-04-16 ENCOUNTER — Inpatient Hospital Stay (HOSPITAL_COMMUNITY): Payer: Medicare Other

## 2014-04-16 DIAGNOSIS — I878 Other specified disorders of veins: Secondary | ICD-10-CM | POA: Insufficient documentation

## 2014-04-16 DIAGNOSIS — E875 Hyperkalemia: Secondary | ICD-10-CM

## 2014-04-16 DIAGNOSIS — M79604 Pain in right leg: Secondary | ICD-10-CM | POA: Insufficient documentation

## 2014-04-16 DIAGNOSIS — M7989 Other specified soft tissue disorders: Secondary | ICD-10-CM

## 2014-04-16 LAB — BASIC METABOLIC PANEL
ANION GAP: 14 (ref 5–15)
Anion gap: 13 (ref 5–15)
BUN: 16 mg/dL (ref 6–23)
BUN: 16 mg/dL (ref 6–23)
CALCIUM: 8.6 mg/dL (ref 8.4–10.5)
CO2: 28 mEq/L (ref 19–32)
CO2: 26 meq/L (ref 19–32)
Calcium: 8.9 mg/dL (ref 8.4–10.5)
Chloride: 96 mEq/L (ref 96–112)
Chloride: 96 mEq/L (ref 96–112)
Creatinine, Ser: 5.37 mg/dL — ABNORMAL HIGH (ref 0.50–1.10)
Creatinine, Ser: 5.72 mg/dL — ABNORMAL HIGH (ref 0.50–1.10)
GFR calc Af Amer: 10 mL/min — ABNORMAL LOW (ref >90)
GFR calc non Af Amer: 9 mL/min — ABNORMAL LOW (ref >90)
GFR, EST AFRICAN AMERICAN: 10 mL/min — AB (ref >90)
GFR, EST NON AFRICAN AMERICAN: 8 mL/min — AB (ref >90)
Glucose, Bld: 224 mg/dL — ABNORMAL HIGH (ref 70–99)
Glucose, Bld: 231 mg/dL — ABNORMAL HIGH (ref 70–99)
Potassium: 3.7 mEq/L (ref 3.7–5.3)
Potassium: 3.7 mEq/L (ref 3.7–5.3)
SODIUM: 136 meq/L — AB (ref 137–147)
Sodium: 137 mEq/L (ref 137–147)

## 2014-04-16 LAB — GLUCOSE, CAPILLARY
GLUCOSE-CAPILLARY: 200 mg/dL — AB (ref 70–99)
GLUCOSE-CAPILLARY: 197 mg/dL — AB (ref 70–99)
GLUCOSE-CAPILLARY: 80 mg/dL (ref 70–99)
Glucose-Capillary: 203 mg/dL — ABNORMAL HIGH (ref 70–99)
Glucose-Capillary: 214 mg/dL — ABNORMAL HIGH (ref 70–99)
Glucose-Capillary: 218 mg/dL — ABNORMAL HIGH (ref 70–99)
Glucose-Capillary: 185 mg/dL — ABNORMAL HIGH (ref 70–99)

## 2014-04-16 MED ORDER — INSULIN GLARGINE 100 UNIT/ML ~~LOC~~ SOLN
7.0000 [IU] | Freq: Two times a day (BID) | SUBCUTANEOUS | Status: DC
  Administered 2014-04-16 – 2014-04-23 (×14): 7 [IU] via SUBCUTANEOUS
  Filled 2014-04-16 (×22): qty 0.07

## 2014-04-16 MED ORDER — INSULIN ASPART 100 UNIT/ML ~~LOC~~ SOLN
0.0000 [IU] | Freq: Three times a day (TID) | SUBCUTANEOUS | Status: DC
  Administered 2014-04-16 (×2): 2 [IU] via SUBCUTANEOUS
  Administered 2014-04-17: 5 [IU] via SUBCUTANEOUS
  Administered 2014-04-18: 2 [IU] via SUBCUTANEOUS
  Administered 2014-04-18: 1 [IU] via SUBCUTANEOUS
  Administered 2014-04-19: 7 [IU] via SUBCUTANEOUS
  Administered 2014-04-20: 2 [IU] via SUBCUTANEOUS
  Administered 2014-04-21 – 2014-04-22 (×2): 7 [IU] via SUBCUTANEOUS
  Administered 2014-04-22: 2 [IU] via SUBCUTANEOUS
  Administered 2014-04-23: 3 [IU] via SUBCUTANEOUS

## 2014-04-16 MED ORDER — SODIUM CHLORIDE 0.9 % IJ SOLN
10.0000 mL | Freq: Two times a day (BID) | INTRAMUSCULAR | Status: DC
  Administered 2014-04-16: 10 mL
  Administered 2014-04-17: 20 mL
  Administered 2014-04-19: 10 mL

## 2014-04-16 MED ORDER — SODIUM CHLORIDE 0.9 % IJ SOLN
10.0000 mL | INTRAMUSCULAR | Status: DC | PRN
  Administered 2014-04-16: 20 mL
  Administered 2014-04-16 – 2014-04-22 (×8): 10 mL
  Filled 2014-04-16 (×8): qty 40

## 2014-04-16 MED ORDER — LIDOCAINE HCL 1 % IJ SOLN
INTRAMUSCULAR | Status: AC
  Filled 2014-04-16: qty 20

## 2014-04-16 MED ORDER — INSULIN GLARGINE 100 UNIT/ML ~~LOC~~ SOLN
8.0000 [IU] | Freq: Every day | SUBCUTANEOUS | Status: DC
  Filled 2014-04-16: qty 0.08

## 2014-04-16 MED ORDER — IOHEXOL 300 MG/ML  SOLN
100.0000 mL | Freq: Once | INTRAMUSCULAR | Status: AC | PRN
  Administered 2014-04-16: 50 mL via INTRAVENOUS

## 2014-04-16 MED ORDER — FENTANYL CITRATE 0.05 MG/ML IJ SOLN
INTRAMUSCULAR | Status: AC
  Filled 2014-04-16: qty 2

## 2014-04-16 MED ORDER — INSULIN ASPART 100 UNIT/ML ~~LOC~~ SOLN
0.0000 [IU] | Freq: Every day | SUBCUTANEOUS | Status: DC
  Administered 2014-04-17: 3 [IU] via SUBCUTANEOUS
  Administered 2014-04-19: 4 [IU] via SUBCUTANEOUS
  Administered 2014-04-22: 2 [IU] via SUBCUTANEOUS

## 2014-04-16 MED ORDER — INSULIN GLARGINE 100 UNIT/ML SOLOSTAR PEN: 7.0000 [IU] | PEN_INJECTOR | Freq: Two times a day (BID) | SUBCUTANEOUS | Status: DC

## 2014-04-16 MED ORDER — FENTANYL CITRATE 0.05 MG/ML IJ SOLN
50.0000 ug | Freq: Once | INTRAMUSCULAR | Status: AC
  Administered 2014-04-16: 50 ug via INTRAVENOUS

## 2014-04-16 NOTE — Progress Notes (Addendum)
[facility]  [name] BOF:[id number] DOB: Apr 21, 1971 DOA: 04/13/2014 PCP: [name], MD  Brief narrative: 43 yo BF PMHx Type 1 diabetes, failed kidney/pancreas transplant, chronic kidney disease on dialysis. Patient has a known history of repeated hospitalizations for complications from nonadherence to medical therapies prescribed. She reports missing dialysis on the Saturday before admission because her daughter was in an automobile accident. By the day of admission she had developed shortness of breath with cough that was productive but she denied fevers chills or chest discomfort.  She presented to the ER where chest x-ray revealed edema with possible pneumonia. Her O2 sats were 70 percent on room air. She was quite hyperglycemic with a glucose of 700 and an anion gap of 30. She was started on an insulin infusion while in the emergency department.  She has undergone dialysis on the date of admission with a total of 3980 mL removed with ultrafiltration. Her insulin drip has been discontinued in the setting of hypoglycemia that is now requiring dextrose 10 infusion. She did have persistent hypoglycemia with CBGs in the 40-50 range on dextrose 10 until around 8:00 am on 11/18 when CBG did increase to 93 and after diet was introduced CBGs increased at 346. Her anion gap was elevated at 17 but this is not unusual for her in the setting of her chronic kidney disease.  Follow-up chest x-ray postdialysis did not demonstrate any evidence of a pneumonia process. Blood cultures are pending and concerns were that given her presentation with DKA with subsequent persistent hypoglycemia that patient may have occult sepsis process. No convincing evidence she has pneumonia though and it was felt that her respiratory symptoms were from volume overload from missed dialysis.  By the evening of 11/19 she was transitioned to Lantus and thus far has remained  stable. A shuntogram of her HD access has been scheduled for 11/20 with HD planned again for 11/21.  HPI/Subjective: No specific complaints- denies SOB or CP.  Assessment/Plan:    Acute respiratory failure with hypoxia due to volume overload -> 3900 cc removed via UF 11/17 with 2100 cc removed 11/19 - no clinical evidence to suggest PNA  -wean and dc oxygen    ESRD (end stage renal disease) -per Nephro  -cont HD -shuntogram planned for 11/20    DKA (diabetic ketoacidoses) and hypoglycemia -onset after admit and required D10 -11/18  CBG was > 300 on D10 so dc'd D10 and was transitioned to Lantus but at 1/2 home dose since she is usually very brittle -overnight into 11/19 went back into DKA (glucose 765, AG 26) so back on insulin drip  -pm of 11/19 transitioned back to Lantus and thus far has remained stable -has a well established hx of  brittle DM.    Hyperlipidemia  Ref. Range 04/15/2014 15:20  Cholesterol Latest Range: 0-200 mg/dL 166  Triglycerides Latest Range: <150 mg/dL 98  HDL Latest Range: >39 mg/dL 72  LDL (calc) Latest Range: 0-99 mg/dL 74  VLDL Latest Range: 0-40 mg/dL 20  Total CHOL/HDL Ratio No range found 2.3      GERD/Gastroparesis -cont PPI    RLS (restless legs syndrome)      DVT prophylaxis: subcutaneous heparin Code Status: full Family Communication: no family at bedside Disposition Plan/Expected LOS: Transfer to floor-shuntogram pending and unclear if will need intervention-also need to monitor for recurrent glycemic issues  Consultants: Nephrology/ Dr Jonnie Finner  Procedures: none  Antibiotics: Zosyn  11/17 > 11/18 Vancomycin 11/17 > 11/18  Objective: Blood pressure 137/63, pulse 85, temperature 97.9 F (36.6 C), temperature source Oral, resp. rate 16, height 5\' 1"  (1.549 m), weight 159 lb 6.3 oz (72.3 kg), last menstrual period 03/26/2014, SpO2 100 %.  Intake/Output Summary (Last 24 hours) at 04/16/14 1244 Last data filed at 04/16/14  1211  Gross per 24 hour  Intake    230 ml  Output      0 ml  Net    230 ml    Exam: Gen: No acute respiratory distress Chest: Clear to auscultation bilaterally, 2L-weaning Cardiac: Regular rate and rhythm, S1-S2, no peripheral edema, no JVD Abdomen: Soft nontender nondistended without obvious hepatosplenomegaly, no ascites Extremities: Symmetrical in appearance without cyanosis, clubbing or effusion  Scheduled Meds:  Scheduled Meds: . aspirin EC  81 mg Oral Daily  . calcium acetate  2,001 mg Oral TID WC  . darbepoetin (ARANESP) injection - DIALYSIS  60 mcg Intravenous Q Thu-HD  . doxercalciferol  2 mcg Intravenous Q T,Th,Sa-HD  . ferric gluconate (FERRLECIT/NULECIT) IV  62.5 mg Intravenous Q Thu-HD  . gabapentin  100 mg Oral Daily  . heparin  5,000 Units Subcutaneous 3 times per day  . insulin aspart  0-5 Units Subcutaneous QHS  . insulin aspart  0-9 Units Subcutaneous TID WC  . insulin glargine  7 Units Subcutaneous BID WC  . loratadine  10 mg Oral Daily  . montelukast  10 mg Oral QHS  . multivitamin  1 tablet Oral Daily  . pantoprazole  40 mg Oral QHS  . sodium chloride  10-40 mL Intracatheter Q12H  . sodium chloride  3 mL Intravenous Q12H    Data Reviewed: Basic Metabolic Panel:  Recent Labs Lab 04/15/14 0405 04/15/14 1520 04/15/14 2220 04/16/14 0300 04/16/14 0620  NA 127* 136* 135* 137 136*  K 5.3 3.6* 3.5* 3.7 3.7  CL 85* 94* 94* 96 96  CO2 16* 27 25 28 26   GLUCOSE 765* 128* 242* 224* 231*  BUN 48* 12 14 16 16   CREATININE 8.70* 3.78* 4.88* 5.37* 5.72*  CALCIUM 7.9* 9.0 8.9 8.9 8.6  MG  --  1.9  --   --   --   PHOS 3.3  --   --   --   --    Liver Function Tests:  Recent Labs Lab 04/13/14 0752 04/15/14 0405  AST 13  --   ALT 8  --   ALKPHOS 115  --   BILITOT 0.7  --   PROT 7.9  --   ALBUMIN 3.5 2.5*   CBC:  Recent Labs Lab 04/13/14 0752 04/13/14 0802 04/13/14 1631 04/14/14 0208 04/15/14 0405  WBC 9.4  --  9.8 7.4 5.5  NEUTROABS 8.3*  --    --   --   --   HGB 10.3* 12.6 9.9* 9.0* 8.5*  HCT 32.7* 37.0 30.8* 27.6* 27.1*  MCV 100.3*  --  96.6 94.8 98.2  PLT 169  --  153 195 158   CBG:  Recent Labs Lab 04/16/14 0214 04/16/14 0404 04/16/14 0638 04/16/14 0817 04/16/14 1216  GLUCAP 203* 214* 218* 200* 197*    Recent Results (from the past 240 hour(s))  Blood culture (routine x 2)     Status: None (Preliminary result)   Collection Time: 04/13/14  8:25 AM  Result Value Ref Range Status   Specimen Description BLOOD RIGHT HAND  Final   Special Requests BOTTLES DRAWN AEROBIC ONLY 2MLS  Final  Culture  Setup Time   Final    04/13/2014 14:04 Performed at [facility]    Culture   Final           BLOOD CULTURE RECEIVED NO GROWTH TO DATE CULTURE WILL BE HELD FOR 5 DAYS BEFORE ISSUING A FINAL NEGATIVE REPORT Performed at [facility]    Report Status PENDING  Incomplete  Blood culture (routine x 2)     Status: None (Preliminary result)   Collection Time: 04/13/14  8:35 AM  Result Value Ref Range Status   Specimen Description BLOOD RIGHT NECK  Final   Special Requests   Final    BOTTLES DRAWN AEROBIC AND ANAEROBIC 5MLS RIGHT EJ LINES    Culture  Setup Time   Final    04/13/2014 14:04 Performed at [facility]    Culture   Final           BLOOD CULTURE RECEIVED NO GROWTH TO DATE CULTURE WILL BE HELD FOR 5 DAYS BEFORE ISSUING A FINAL NEGATIVE REPORT Performed at [facility]    Report Status PENDING  Incomplete  MRSA PCR Screening     Status: None   Collection Time: 04/13/14  3:32 PM  Result Value Ref Range Status   MRSA by PCR NEGATIVE NEGATIVE Final    Comment:        The GeneXpert MRSA Assay (FDA approved for NASAL specimens only), is one component of a comprehensive MRSA colonization surveillance program. It is not intended to diagnose MRSA infection nor to guide or monitor treatment for MRSA infections.   Culture, routine-abscess     Status: None (Preliminary result)    Collection Time: 04/14/14  3:03 PM  Result Value Ref Range Status   Specimen Description ABSCESS LYMPH NODE  Final   Special Requests NONE  Final   Gram Stain   Final    RARE WBC PRESENT,BOTH PMN AND MONONUCLEAR NO SQUAMOUS EPITHELIAL CELLS SEEN NO ORGANISMS SEEN Performed at [facility]    Culture   Final    NO GROWTH 2 DAYS Performed at [facility]    Report Status PENDING  Incomplete     Studies:  Recent x-ray studies have been reviewed in detail by the Attending Physician  Time spent :  40 mins  Erin Hearing, ANP [facility] Office  [phone] Pager [phone]  On-Call/Text Page:      [email or web address]      password [email or web address]  If 7PM-7AM, please contact night-coverage [email or web address] Password [facility] 04/16/2014, 12:44 PM   LOS: 3 days   She reports swelling in the RLE and weakness on the right side, however, on exam her strength is equal bilaterally.  She has trouble complying with exam on the RLE due to pain.  Will continue to monitor.  If her symptoms persist, may need evaluation with MRI.   Examined patient and discussed assessment and plan with ANP Ebony Hail and agree with above. Patient with multiple complex medical problems> 40 minutes spent in direct patient care  Gilles Chiquito, MD

## 2014-04-16 NOTE — Progress Notes (Signed)
Subjective:  Stable, BS's low 200's and steady  Objective Vital signs in last 24 hours: Filed Vitals:   04/15/14 2018 04/15/14 2353 04/16/14 0406 04/16/14 0800  BP: 121/76 129/60 133/71   Pulse: 91 85 79   Temp: 99.4 F (37.4 C) 98.8 F (37.1 C) 98.3 F (36.8 C) 98.3 F (36.8 C)  TempSrc: Oral Oral Oral Oral  Resp: 16 15 16    Height:      Weight:   72.3 kg (159 lb 6.3 oz)   SpO2: 99% 94% 100%    Weight change:   Physical Exam: General:alert, nad, on hd Resp clear bilat Cor RRR 2/6 soft SEM no RG Abd soft, NTND no ascites Mild R leg edema HD Access R thigh AVG patent on hd  With Projected kt/v  At 2.0  HD: TTS NW F160 3h 41min, 68.5kh 2/2.5 Bath R thigh AVG Heparin none Hect 2 ug TIW, Aranesp 25 mcg weekly, Venofer 50/wk maint  Assessment: 1. Resp failure / hypoxemia/ pulm edema - resolved 2. Hyperkalemia - resolved 3. ESRD TTS 4. RLE edema - for shuntogram today 5. DM1 - stable BS , per primary 6. Hx failed KP tx , off of immunosuppressives 7. Anemia= Hgb 8.5 on esa 31mcg  ^ 5mcg/ weekly fe on hd 8. Sec HPTH cont vit D, phoslo 9. HTN - not on any BP meds per home med list 10. Dispo - per primary, stable for dc from renal standpoint  Plan - for shuntogram today per IR. HD Sat if still here  Kelly Splinter MD pager [phone]    cell [phone] 04/16/2014, 10:51 AM    Labs: Basic Metabolic Panel:  Recent Labs Lab 04/15/14 0405  04/15/14 2220 04/16/14 0300 04/16/14 0620  NA 127*  < > 135* 137 136*  K 5.3  < > 3.5* 3.7 3.7  CL 85*  < > 94* 96 96  CO2 16*  < > 25 28 26   GLUCOSE 765*  < > 242* 224* 231*  BUN 48*  < > 14 16 16   CREATININE 8.70*  < > 4.88* 5.37* 5.72*  CALCIUM 7.9*  < > 8.9 8.9 8.6  PHOS 3.3  --   --   --   --   < > = values in this interval not displayed. Liver Function Tests:  Recent Labs Lab 04/13/14 0752 04/15/14 0405  AST 13  --   ALT 8  --   ALKPHOS 115  --   BILITOT 0.7  --   PROT 7.9  --   ALBUMIN 3.5 2.5*   CBC:  Recent Labs Lab 04/13/14 0752  04/13/14 1631 04/14/14 0208 04/15/14 0405  WBC 9.4  --  9.8 7.4 5.5  NEUTROABS 8.3*  --   --   --   --   HGB 10.3*  < > 9.9* 9.0* 8.5*  HCT 32.7*  < > 30.8* 27.6* 27.1*  MCV 100.3*  --  96.6 94.8 98.2  PLT 169  --  153 195 158  < > = values in this interval not displayed. CBG:  Recent Labs Lab 04/15/14 2353 04/16/14 0214 04/16/14 0404 04/16/14 0638 04/16/14 0817  GLUCAP 213* 203* 214* 218* 200*     Medications: . sodium chloride 10 mL/hr at 04/15/14 2030   . aspirin EC  81 mg Oral Daily  . calcium acetate  2,001 mg Oral TID WC  . darbepoetin (ARANESP) injection - DIALYSIS  60 mcg Intravenous Q Thu-HD  . doxercalciferol  2 mcg  Intravenous Q T,Th,Sa-HD  . ferric gluconate (FERRLECIT/NULECIT) IV  62.5 mg Intravenous Q Thu-HD  . gabapentin  100 mg Oral Daily  . heparin  5,000 Units Subcutaneous 3 times per day  . insulin aspart  0-5 Units Subcutaneous QHS  . insulin aspart  0-9 Units Subcutaneous TID WC  . insulin glargine  7 Units Subcutaneous BID WC  . loratadine  10 mg Oral Daily  . montelukast  10 mg Oral QHS  . multivitamin  1 tablet Oral Daily  . pantoprazole  40 mg Oral QHS  . sodium chloride  3 mL Intravenous Q12H

## 2014-04-16 NOTE — Plan of Care (Signed)
Problem: Consults Goal: Diabetic Ketoacidosis (DKA) Patient Education See Patient Education Modules for education specifics.  Outcome: Completed/Met Date Met:  04/16/14 Goal: Nutrition Consult-if indicated Outcome: Completed/Met Date Met:  04/16/14 Goal: Diabetes Guidelines if Diabetic/Glucose > 140 If diabetic or lab glucose is > 140 mg/dl - Initiate Diabetes/Hyperglycemia Guidelines & Document Interventions  Outcome: Progressing  Problem: Phase II Progression Outcomes Goal: Monitor hydration status Outcome: Completed/Met Date Met:  04/16/14 Goal: Nausea & vomiting resolved Outcome: Completed/Met Date Met:  04/16/14

## 2014-04-16 NOTE — Procedures (Signed)
Procedure:  Right thigh AV shuntogram and venous angioplasty Findings:  Graft widely patent, including arterial and venous anastamotic stenoses. 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 Treated with 10 mm and 12 mm angioplasty with good result.

## 2014-04-16 NOTE — Sedation Documentation (Signed)
Pt procedure complete, 50mg  fentanyl give for pain. Vitals WNL, 100% on RA, A/O. C/O of slight pain to the procedural site. Pain decreased when MD removed catheter.

## 2014-04-16 NOTE — Clinical Social Work Note (Signed)
CSW informed that patient wanted to talk with a social worker about her current housing situation. Patient out of room when visit made. CSW will f/u with patient at another time.  Nashonda Limberg Givens, MSW, LCSW [phone]

## 2014-04-17 LAB — BASIC METABOLIC PANEL
Anion gap: 16 — ABNORMAL HIGH (ref 5–15)
BUN: 25 mg/dL — ABNORMAL HIGH (ref 6–23)
CHLORIDE: 92 meq/L — AB (ref 96–112)
CO2: 27 meq/L (ref 19–32)
CREATININE: 9.35 mg/dL — AB (ref 0.50–1.10)
Calcium: 9.1 mg/dL (ref 8.4–10.5)
GFR calc non Af Amer: 5 mL/min — ABNORMAL LOW (ref >90)
GFR, EST AFRICAN AMERICAN: 5 mL/min — AB (ref >90)
Glucose, Bld: 266 mg/dL — ABNORMAL HIGH (ref 70–99)
Potassium: 4.3 mEq/L (ref 3.7–5.3)
SODIUM: 135 meq/L — AB (ref 137–147)

## 2014-04-17 LAB — GLUCOSE, CAPILLARY
GLUCOSE-CAPILLARY: 251 mg/dL — AB (ref 70–99)
Glucose-Capillary: 42 mg/dL — CL (ref 70–99)
Glucose-Capillary: 44 mg/dL — CL (ref 70–99)
Glucose-Capillary: 79 mg/dL (ref 70–99)
Glucose-Capillary: 267 mg/dL — ABNORMAL HIGH (ref 70–99)
Glucose-Capillary: 299 mg/dL — ABNORMAL HIGH (ref 70–99)

## 2014-04-17 LAB — CULTURE, ROUTINE-ABSCESS: Culture: NO GROWTH

## 2014-04-17 MED ORDER — DOXERCALCIFEROL 4 MCG/2ML IV SOLN
INTRAVENOUS | Status: AC
  Filled 2014-04-17: qty 2

## 2014-04-17 MED ORDER — ONDANSETRON HCL 4 MG/2ML IJ SOLN
INTRAMUSCULAR | Status: AC
  Filled 2014-04-17: qty 2

## 2014-04-17 MED ORDER — DIPHENHYDRAMINE HCL 25 MG PO CAPS
ORAL_CAPSULE | ORAL | Status: AC
  Filled 2014-04-17: qty 2

## 2014-04-17 MED ORDER — OXYCODONE HCL 5 MG PO TABS
ORAL_TABLET | ORAL | Status: AC
  Filled 2014-04-17: qty 1

## 2014-04-17 MED ORDER — DIPHENHYDRAMINE HCL 25 MG PO CAPS
50.0000 mg | ORAL_CAPSULE | Freq: Every evening | ORAL | Status: DC | PRN
  Administered 2014-04-17: 50 mg via ORAL

## 2014-04-17 NOTE — Procedures (Signed)
I was present at this dialysis session, have reviewed the session itself and made  appropriate changes  Is having some R body numbness today, no motor deficit. Primary team is aware, MRI planned and PT consult.  Tolerating HD well.  High wt gains between HD sessions. UF 3 kg to dry wt today.    Kelly Splinter MD (pgr) [phone]    [phone] 04/17/2014, 11:14 AM

## 2014-04-17 NOTE — Progress Notes (Signed)
Hypoglycemic Event  CBG: 42  Treatment: carb snack  Symptoms: numbness  Follow-up CBG: Time:1714 CBG Result:44  Possible Reasons for Event:   Comments/MD notified:yes    [name], [name]  Remember to initiate Hypoglycemia Order Set & complete

## 2014-04-17 NOTE — Progress Notes (Signed)
Hypoglycemic Event  CBG: 44  Treatment: carb snack  Symptoms: numbness  Follow-up CBG: Time:1739 CBG Result:79  Possible Reasons for Event: unknown  Comments/MD notified:yes    [place], Carmon Sails  Remember to initiate Hypoglycemia Order Set & complete

## 2014-04-17 NOTE — Progress Notes (Signed)
[name] 1 Transfer 11/21  [name] WVP:[id number] DOB: [birth year] DOA: 04/13/2014 PCP: [name], MD  Brief narrative: 43 yo BF PMHx Type 1 diabetes, failed kidney/pancreas transplant, chronic kidney disease on dialysis. Patient has a known history of repeated hospitalizations for complications from nonadherence to medical therapies prescribed. She reports missing dialysis on the Saturday before admission because her daughter was in an automobile accident. By the day of admission she had developed shortness of breath with cough that was productive but she denied fevers chills or chest discomfort.  She presented to the ER where chest x-ray revealed edema with possible pneumonia. Her O2 sats were 70 percent on room air. She was quite hyperglycemic with a glucose of 700 and an anion gap of 30. She was started on an insulin infusion while in the emergency department.  She has undergone dialysis on the date of admission with a total of 3980 mL removed with ultrafiltration. Her insulin drip has been discontinued in the setting of hypoglycemia that is now requiring dextrose 10 infusion. She did have persistent hypoglycemia with CBGs in the 40-50 range on dextrose 10 until around 8:00 am on 11/18 when CBG did increase to 93 and after diet was introduced CBGs increased at 346. Her anion gap was elevated at 17 but this is not unusual for her in the setting of her chronic kidney disease.  Follow-up chest x-ray postdialysis did not demonstrate any evidence of a pneumonia process. Blood cultures are pending and concerns were that given her presentation with DKA with subsequent persistent hypoglycemia that patient may have occult sepsis process. No convincing evidence she has pneumonia though and it was felt that her respiratory symptoms were from volume overload from missed dialysis.  By the evening of 11/19 she was transitioned to Lantus and thus far has remained stable. A  shuntogram of her HD access has been scheduled for 11/20 with HD planned again for 11/21.  HPI/Subjective: Complains of Numbness and weakness of entire R side of body from forehead down, when explained that this was not neurologically possible changed the location to R arm and R leg  Assessment/Plan:    Acute respiratory failure with hypoxia due to volume overload -resolved -> 3900 cc removed via UF 11/17 with 2100 cc removed 11/19 - no clinical evidence to suggest PNA  -wean and dc oxygen    ESRD (end stage renal disease) -per Nephro  -cont HD -shuntogram completed per IR 11/20    R arm numbness and weakness -reportedly for 4days and worsening -on Neuro exam, mild weakness on R but unable to differentiate from poor effort -will check MRI and get PT eval    DKA (diabetic ketoacidoses) and hypoglycemia -onset after admit and required D10 -11/18  CBG was > 300 on D10 so dc'd D10 and was transitioned to Lantus but at 1/2 home dose since she is usually very brittle -overnight into 11/19 went back into DKA (glucose 765, AG 26) so back on insulin drip  -pm of 11/19 transitioned back to Lantus and thus far has remained stable -has a well established hx of  brittle DM.    Hyperlipidemia  Ref. Range 04/15/2014 15:20  Cholesterol Latest Range: 0-200 mg/dL 166  Triglycerides Latest Range: <150 mg/dL 98  HDL Latest Range: >39 mg/dL 72  LDL (calc) Latest Range: 0-99 mg/dL 74  VLDL Latest Range: 0-40 mg/dL 20  Total CHOL/HDL Ratio No range found 2.3      GERD/Gastroparesis -cont PPI  RLS (restless legs syndrome)      Non compliance  DVT prophylaxis: subcutaneous heparin Code Status: full Family Communication: no family at bedside Disposition Plan/Expected LOS: home pending workup of Neuro complaints today Consultants: Nephrology/ Dr Jonnie Finner  Procedures: none  Antibiotics: Zosyn 11/17 > 11/18 Vancomycin 11/17 > 11/18  Objective: Blood pressure 148/75, pulse 91,  temperature 97.5 F (36.4 C), temperature source Oral, resp. rate 18, height 5\' 1"  (1.549 m), weight 70.7 kg (155 lb 13.8 oz), last menstrual period 03/26/2014, SpO2 100 %.  Intake/Output Summary (Last 24 hours) at 04/17/14 0959 Last data filed at 04/17/14 0600  Gross per 24 hour  Intake    500 ml  Output      0 ml  Net    500 ml    Exam: Gen: No acute respiratory distress Chest: Clear to auscultation bilaterally, 2L-weaning Cardiac: Regular rate and rhythm, S1-S2, no peripheral edema, no JVD Abdomen: Soft nontender nondistended without obvious hepatosplenomegaly, no ascites Extremities: Symmetrical in appearance without cyanosis, clubbing or effusion Neuro: RUE 3-4/5, RLE 3-4/5, unable to differentiate from poor effort, DTR 1plus, plantars mute  Scheduled Meds:  Scheduled Meds: . aspirin EC  81 mg Oral Daily  . calcium acetate  2,001 mg Oral TID WC  . darbepoetin (ARANESP) injection - DIALYSIS  60 mcg Intravenous Q Thu-HD  . doxercalciferol      . doxercalciferol  2 mcg Intravenous Q T,Th,Sa-HD  . ferric gluconate (FERRLECIT/NULECIT) IV  62.5 mg Intravenous Q Thu-HD  . gabapentin  100 mg Oral Daily  . heparin  5,000 Units Subcutaneous 3 times per day  . insulin aspart  0-5 Units Subcutaneous QHS  . insulin aspart  0-9 Units Subcutaneous TID WC  . insulin glargine  7 Units Subcutaneous BID WC  . loratadine  10 mg Oral Daily  . montelukast  10 mg Oral QHS  . multivitamin  1 tablet Oral Daily  . ondansetron      . oxyCODONE      . pantoprazole  40 mg Oral QHS  . sodium chloride  10-40 mL Intracatheter Q12H  . sodium chloride  3 mL Intravenous Q12H    Data Reviewed: Basic Metabolic Panel:  Recent Labs Lab 04/15/14 0405 04/15/14 1520 04/15/14 2220 04/16/14 0300 04/16/14 0620 04/17/14 0535  NA 127* 136* 135* 137 136* 135*  K 5.3 3.6* 3.5* 3.7 3.7 4.3  CL 85* 94* 94* 96 96 92*  CO2 16* 27 25 28 26 27   GLUCOSE 765* 128* 242* 224* 231* 266*  BUN 48* 12 14 16 16  25*   CREATININE 8.70* 3.78* 4.88* 5.37* 5.72* 9.35*  CALCIUM 7.9* 9.0 8.9 8.9 8.6 9.1  MG  --  1.9  --   --   --   --   PHOS 3.3  --   --   --   --   --    Liver Function Tests:  Recent Labs Lab 04/13/14 0752 04/15/14 0405  AST 13  --   ALT 8  --   ALKPHOS 115  --   BILITOT 0.7  --   PROT 7.9  --   ALBUMIN 3.5 2.5*   CBC:  Recent Labs Lab 04/13/14 0752 04/13/14 0802 04/13/14 1631 04/14/14 0208 04/15/14 0405  WBC 9.4  --  9.8 7.4 5.5  NEUTROABS 8.3*  --   --   --   --   HGB 10.3* 12.6 9.9* 9.0* 8.5*  HCT 32.7* 37.0 30.8* 27.6* 27.1*  MCV 100.3*  --  96.6 94.8 98.2  PLT 169  --  153 195 158   CBG:  Recent Labs Lab 04/16/14 0638 04/16/14 0817 04/16/14 1216 04/16/14 1751 04/16/14 2045  GLUCAP 218* 200* 197* 80 185*    Recent Results (from the past 240 hour(s))  Blood culture (routine x 2)     Status: None (Preliminary result)   Collection Time: 04/13/14  8:25 AM  Result Value Ref Range Status   Specimen Description BLOOD RIGHT HAND  Final   Special Requests BOTTLES DRAWN AEROBIC ONLY 2MLS  Final   Culture  Setup Time   Final    04/13/2014 14:04 Performed at [facility]    Culture   Final           BLOOD CULTURE RECEIVED NO GROWTH TO DATE CULTURE WILL BE HELD FOR 5 DAYS BEFORE ISSUING A FINAL NEGATIVE REPORT Performed at [facility]    Report Status PENDING  Incomplete  Blood culture (routine x 2)     Status: None (Preliminary result)   Collection Time: 04/13/14  8:35 AM  Result Value Ref Range Status   Specimen Description BLOOD RIGHT NECK  Final   Special Requests   Final    BOTTLES DRAWN AEROBIC AND ANAEROBIC 5MLS RIGHT EJ LINES    Culture  Setup Time   Final    04/13/2014 14:04 Performed at [facility]    Culture   Final           BLOOD CULTURE RECEIVED NO GROWTH TO DATE CULTURE WILL BE HELD FOR 5 DAYS BEFORE ISSUING A FINAL NEGATIVE REPORT Performed at [facility]    Report Status PENDING  Incomplete  MRSA  PCR Screening     Status: None   Collection Time: 04/13/14  3:32 PM  Result Value Ref Range Status   MRSA by PCR NEGATIVE NEGATIVE Final    Comment:        The GeneXpert MRSA Assay (FDA approved for NASAL specimens only), is one component of a comprehensive MRSA colonization surveillance program. It is not intended to diagnose MRSA infection nor to guide or monitor treatment for MRSA infections.   Culture, routine-abscess     Status: None (Preliminary result)   Collection Time: 04/14/14  3:03 PM  Result Value Ref Range Status   Specimen Description ABSCESS LYMPH NODE  Final   Special Requests NONE  Final   Gram Stain   Final    RARE WBC PRESENT,BOTH PMN AND MONONUCLEAR NO SQUAMOUS EPITHELIAL CELLS SEEN NO ORGANISMS SEEN Performed at [facility]    Culture   Final    NO GROWTH 2 DAYS Performed at [facility]    Report Status PENDING  Incomplete      If 7PM-7AM, please contact night-coverage [email or web address] Password [facility] 04/17/2014, 9:59 AM   LOS: 4 days   Domenic Polite, [place]

## 2014-04-17 NOTE — Progress Notes (Signed)
[name] complained about her right hand being weak along with her right leg.  She states that this has been happening for 4-5 days and started prior to this admission.

## 2014-04-17 NOTE — Progress Notes (Signed)
PT Cancellation Note  Patient Details Name: [name] MRN: [id number] DOB: 06-Oct-1970   Cancelled Treatment:    Reason Eval/Treat Not Completed: Patient at procedure or test/unavailable (at HD, will follow up as able)   Duncan Dull 04/17/2014, 11:47 AM Alben Deeds, PT DPT  [phone]

## 2014-04-18 ENCOUNTER — Inpatient Hospital Stay (HOSPITAL_COMMUNITY): Payer: Medicare Other

## 2014-04-18 DIAGNOSIS — I639 Cerebral infarction, unspecified: Secondary | ICD-10-CM

## 2014-04-18 LAB — CBC
HEMATOCRIT: 28.8 % — AB (ref 36.0–46.0)
Hemoglobin: 9.1 g/dL — ABNORMAL LOW (ref 12.0–15.0)
MCH: 30.5 pg (ref 26.0–34.0)
MCHC: 31.6 g/dL (ref 30.0–36.0)
MCV: 96.6 fL (ref 78.0–100.0)
Platelets: 167 10*3/uL (ref 150–400)
RBC: 2.98 MIL/uL — ABNORMAL LOW (ref 3.87–5.11)
RDW: 14.2 % (ref 11.5–15.5)
WBC: 4.5 10*3/uL (ref 4.0–10.5)

## 2014-04-18 LAB — BASIC METABOLIC PANEL
Anion gap: 15 (ref 5–15)
BUN: 19 mg/dL (ref 6–23)
CO2: 26 meq/L (ref 19–32)
CREATININE: 6.55 mg/dL — AB (ref 0.50–1.10)
Calcium: 9.2 mg/dL (ref 8.4–10.5)
Chloride: 95 mEq/L — ABNORMAL LOW (ref 96–112)
GFR calc Af Amer: 8 mL/min — ABNORMAL LOW (ref >90)
GFR calc non Af Amer: 7 mL/min — ABNORMAL LOW (ref >90)
GLUCOSE: 133 mg/dL — AB (ref 70–99)
Potassium: 3.9 mEq/L (ref 3.7–5.3)
Sodium: 136 mEq/L — ABNORMAL LOW (ref 137–147)

## 2014-04-18 LAB — GLUCOSE, CAPILLARY
GLUCOSE-CAPILLARY: 189 mg/dL — AB (ref 70–99)
GLUCOSE-CAPILLARY: 111 mg/dL — AB (ref 70–99)
GLUCOSE-CAPILLARY: 145 mg/dL — AB (ref 70–99)
Glucose-Capillary: 127 mg/dL — ABNORMAL HIGH (ref 70–99)

## 2014-04-18 MED ORDER — METOCLOPRAMIDE HCL 5 MG/ML IJ SOLN: 5.0000 mg | Freq: Once | INTRAMUSCULAR | Status: DC

## 2014-04-18 MED ORDER — PROCHLORPERAZINE EDISYLATE 5 MG/ML IJ SOLN
10.0000 mg | Freq: Once | INTRAMUSCULAR | Status: AC
  Administered 2014-04-18: 10 mg via INTRAVENOUS
  Filled 2014-04-18: qty 2

## 2014-04-18 MED ORDER — DIPHENHYDRAMINE HCL 50 MG/ML IJ SOLN
12.5000 mg | Freq: Once | INTRAMUSCULAR | Status: AC
  Administered 2014-04-18: 12.5 mg via INTRAVENOUS
  Filled 2014-04-18: qty 1

## 2014-04-18 MED ORDER — ASPIRIN 325 MG PO TABS
325.0000 mg | ORAL_TABLET | Freq: Every day | ORAL | Status: DC
  Administered 2014-04-18 – 2014-04-23 (×6): 325 mg via ORAL
  Filled 2014-04-18 (×6): qty 1

## 2014-04-18 NOTE — Progress Notes (Signed)
Subjective:    Complaining of R sided pain and numbness, MRI pending  Objective Filed Vitals:   04/17/14 1329 04/17/14 1638 04/17/14 2044 04/18/14 0628  BP: 156/67 165/63 145/65 135/67  Pulse: 89 96 96 88  Temp: 97.6 F (36.4 C) 97.8 F (36.6 C) 98.6 F (37 C) 98.4 F (36.9 C)  TempSrc: Oral Oral Oral Oral  Resp: 20 20 18 18   Height:      Weight:   68.601 kg (151 lb 3.8 oz)   SpO2: 98% 99% 93% 95%   Physical Exam General: alert and oriented. No acute distress Heart: RRR 2/6 systolic  Lungs: CTA, unlabored  Abdomen: soft, nontender +BS Extremities: trace RLE edema. Weak R hand grasp and weak dorsi and plantar flex.  Unable to fully lift R arm and leg against gravity. R leg tender Dialysis Access: R thigh AVg +  HD: TTS NW F160 3h 42min, 68.5kh 2/2.5 Bath R thigh AVG Heparin none Hect 2 ug TIW, Aranesp 25 mcg weekly, Venofer 50/wk maint  Assessment/Plan: 1. Pulm edema- resolved.  2. R sided pain and numbness- MRI pending. Shuntogram w PTA 11/20  2. ESRD -HD TTS, pending tomorrow for holiday schedule. K+3.9 3. Anemia - hgb 9.1 aranesp increased to 60. Cont Fe.  4. Secondary hyperparathyroidism - Ca+ 9.2 cont hectorol phos 3.3 no binder 5. HTN/volume - 135/67, at EDW. No volume excess.  6. Nutrition - renal/ carb modified. vitamin 7. DM -per primary   Shelle Iron, NP [place] [phone] 04/18/2014,9:15 AM  LOS: 5 days   Pt seen, examined and agree w A/P as above. Stable from renal standpoint.  Kelly Splinter MD pager [phone]    cell [phone] 04/18/2014, 12:57 PM    Additional Objective Labs: Basic Metabolic Panel:  Recent Labs Lab 04/15/14 0405  04/16/14 0300 04/16/14 0620 04/17/14 0535  NA 127*  < > 137 136* 135*  K 5.3  < > 3.7 3.7 4.3  CL 85*  < > 96 96 92*  CO2 16*  < > 28 26 27   GLUCOSE 765*  < > 224* 231* 266*  BUN 48*  < > 16 16 25*  CREATININE 8.70*  < > 5.37* 5.72* 9.35*  CALCIUM 7.9*  < > 8.9 8.6 9.1  PHOS  3.3  --   --   --   --   < > = values in this interval not displayed. Liver Function Tests:  Recent Labs Lab 04/13/14 0752 04/15/14 0405  AST 13  --   ALT 8  --   ALKPHOS 115  --   BILITOT 0.7  --   PROT 7.9  --   ALBUMIN 3.5 2.5*   No results for input(s): LIPASE, AMYLASE in the last 168 hours. CBC:  Recent Labs Lab 04/13/14 0752  04/13/14 1631 04/14/14 0208 04/15/14 0405 04/18/14 0446  WBC 9.4  --  9.8 7.4 5.5 4.5  NEUTROABS 8.3*  --   --   --   --   --   HGB 10.3*  < > 9.9* 9.0* 8.5* 9.1*  HCT 32.7*  < > 30.8* 27.6* 27.1* 28.8*  MCV 100.3*  --  96.6 94.8 98.2 96.6  PLT 169  --  153 195 158 167  < > = values in this interval not displayed. Blood Culture    Component Value Date/Time   SDES ABSCESS LYMPH NODE 04/14/2014 1503   SPECREQUEST NONE 04/14/2014 1503   CULT  04/14/2014 1503    NO  GROWTH 3 DAYS Performed at [place] 04/17/2014 FINAL 04/14/2014 1503    Cardiac Enzymes: No results for input(s): CKTOTAL, CKMB, CKMBINDEX, TROPONINI in the last 168 hours. CBG:  Recent Labs Lab 04/17/14 1714 04/17/14 1739 04/17/14 2043 04/17/14 2245 04/18/14 0713  GLUCAP 44* 79 267* 299* 127*   Iron Studies: No results for input(s): IRON, TIBC, TRANSFERRIN, FERRITIN in the last 72 hours. @lablastinr3 @  Medications: . sodium chloride 10 mL/hr at 04/15/14 2030   . aspirin EC  81 mg Oral Daily  . calcium acetate  2,001 mg Oral TID WC  . darbepoetin (ARANESP) injection - DIALYSIS  60 mcg Intravenous Q Thu-HD  . doxercalciferol  2 mcg Intravenous Q T,Th,Sa-HD  . ferric gluconate (FERRLECIT/NULECIT) IV  62.5 mg Intravenous Q Thu-HD  . gabapentin  100 mg Oral Daily  . heparin  5,000 Units Subcutaneous 3 times per day  . insulin aspart  0-5 Units Subcutaneous QHS  . insulin aspart  0-9 Units Subcutaneous TID WC  . insulin glargine  7 Units Subcutaneous BID WC  . loratadine  10 mg Oral Daily  . montelukast  10 mg Oral QHS  . multivitamin  1  tablet Oral Daily  . pantoprazole  40 mg Oral QHS  . sodium chloride  10-40 mL Intracatheter Q12H  . sodium chloride  3 mL Intravenous Q12H   Subjective:     Objective Filed Vitals:   04/17/14 1329 04/17/14 1638 04/17/14 2044 04/18/14 0628  BP: 156/67 165/63 145/65 135/67  Pulse: 89 96 96 88  Temp: 97.6 F (36.4 C) 97.8 F (36.6 C) 98.6 F (37 C) 98.4 F (36.9 C)  TempSrc: Oral Oral Oral Oral  Resp: 20 20 18 18   Height:      Weight:   68.601 kg (151 lb 3.8 oz)   SpO2: 98% 99% 93% 95%   Physical Exam General: Heart: Lungs: Abdomen: Extremities: Dialysis Access:   Assessment/Plan: 1.  2. ESRD - 3. Anemia -  4. Secondary hyperparathyroidism -  5. HTN/volume -  6. Nutrition -   Shelle Iron, NP [place] [phone] 04/18/2014,9:15 AM  LOS: 5 days    Additional Objective Labs: Basic Metabolic Panel:  Recent Labs Lab 04/15/14 0405  04/16/14 0300 04/16/14 0620 04/17/14 0535  NA 127*  < > 137 136* 135*  K 5.3  < > 3.7 3.7 4.3  CL 85*  < > 96 96 92*  CO2 16*  < > 28 26 27   GLUCOSE 765*  < > 224* 231* 266*  BUN 48*  < > 16 16 25*  CREATININE 8.70*  < > 5.37* 5.72* 9.35*  CALCIUM 7.9*  < > 8.9 8.6 9.1  PHOS 3.3  --   --   --   --   < > = values in this interval not displayed. Liver Function Tests:  Recent Labs Lab 04/13/14 0752 04/15/14 0405  AST 13  --   ALT 8  --   ALKPHOS 115  --   BILITOT 0.7  --   PROT 7.9  --   ALBUMIN 3.5 2.5*   No results for input(s): LIPASE, AMYLASE in the last 168 hours. CBC:  Recent Labs Lab 04/13/14 0752  04/13/14 1631 04/14/14 0208 04/15/14 0405 04/18/14 0446  WBC 9.4  --  9.8 7.4 5.5 4.5  NEUTROABS 8.3*  --   --   --   --   --   HGB 10.3*  < >  9.9* 9.0* 8.5* 9.1*  HCT 32.7*  < > 30.8* 27.6* 27.1* 28.8*  MCV 100.3*  --  96.6 94.8 98.2 96.6  PLT 169  --  153 195 158 167  < > = values in this interval not displayed. Blood Culture    Component Value Date/Time   SDES ABSCESS  LYMPH NODE 04/14/2014 1503   SPECREQUEST NONE 04/14/2014 1503   CULT  04/14/2014 1503    NO GROWTH 3 DAYS Performed at [place] 04/17/2014 FINAL 04/14/2014 1503    Cardiac Enzymes: No results for input(s): CKTOTAL, CKMB, CKMBINDEX, TROPONINI in the last 168 hours. CBG:  Recent Labs Lab 04/17/14 1714 04/17/14 1739 04/17/14 2043 04/17/14 2245 04/18/14 0713  GLUCAP 44* 79 267* 299* 127*   Iron Studies: No results for input(s): IRON, TIBC, TRANSFERRIN, FERRITIN in the last 72 hours. @lablastinr3 @ Studies/Results: Ir Pta Venous Right  04/16/2014   CLINICAL DATA:  Pain overlying right thigh dialysis graft with right lower extremity edema. 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  EXAM: 1.  DIALYSIS AV SHUNTOGRAM  2.  ANGIOPLASTY OF RIGHT EXTERNAL AND COMMON ILIAC VEINS  COMPARISON:  None  CONTRAST:  50 mL Omnipaque 300  FLUOROSCOPY TIME:  1 min and 54 seconds  MEDICATIONS: 50 mcg IV fentanyl  PROCEDURE: The procedure, risks, benefits, and alternatives were explained to the patient. Questions regarding the procedure were encouraged and answered. The patient understands and consents to the procedure.  The right thigh dialysis graft was prepped with Betadine in a sterile fashion, and a sterile drape was applied covering the operative field. A diagnostic shunt study was performed via an 18 gauge angiocatheter introduced into venous outflow. Venous drainage was assessed to the level of the central veins in the chest. Proximal shunt was studied by reflux maneuver with temporary compression of venous outflow.  The angiocath was removed and replaced with a 7-French sheath over a guidewire. Balloon angioplasty of the right external iliac vein was then performed with a 10 mm x 4 cm Conquest balloon. Angiography was performed.  Additional balloon angioplasty was performed of the right common iliac vein with a 12 mm x 4 cm Atlas balloon.  Balloon angioplasty  was followed by additional angiography. The sheath was removed and hemostasis obtained with application of a 2-0 Ethilon pursestring suture.  COMPLICATIONS: None  FINDINGS: The in loop graft in the right thigh is normally patent including arterial and venous anastomoses. The graft itself shows small focal pseudoaneurysms related to prior puncture sites. Venous outflow demonstrates focal stenosis of the distal external iliac vein of approximately 80%. Longer segmental stenosis of the common iliac vein approaches 80% in estimated caliber. The inferior vena cava is widely patent.  After 10 mm balloon angioplasty, there was improvement in appearance of the external iliac vein stenosis with mild residual roughly 25% stenosis present. There remained moderate degree of common iliac vein narrowing of approximately 60- 70%. After further 12 mm balloon angioplasty of the common iliac vein, there is further improvement in patency with only mild 20- 25% narrowing remaining. Excellent flow was present through the outflow veins on completion.  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 These were successfully treated with 10 mm and 12 mm balloon angioplasty with significant angiographic improvement, as above.  ACCESS: The graft and venous outflow remain amenable to percutaneous intervention.   Electronically Signed   By: Aletta Edouard  M.D.   On: 04/16/2014 17:06   Ir Shuntogram/ Fistulagram Right Mod Sed  04/16/2014   CLINICAL DATA:  Pain overlying right thigh dialysis graft with right lower extremity edema. 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  EXAM: 1.  DIALYSIS AV SHUNTOGRAM  2.  ANGIOPLASTY OF RIGHT EXTERNAL AND COMMON ILIAC VEINS  COMPARISON:  None  CONTRAST:  50 mL Omnipaque 300  FLUOROSCOPY TIME:  1 min and 54 seconds  MEDICATIONS: 50 mcg IV fentanyl  PROCEDURE: The procedure, risks, benefits, and alternatives were explained  to the patient. Questions regarding the procedure were encouraged and answered. The patient understands and consents to the procedure.  The right thigh dialysis graft was prepped with Betadine in a sterile fashion, and a sterile drape was applied covering the operative field. A diagnostic shunt study was performed via an 18 gauge angiocatheter introduced into venous outflow. Venous drainage was assessed to the level of the central veins in the chest. Proximal shunt was studied by reflux maneuver with temporary compression of venous outflow.  The angiocath was removed and replaced with a 7-French sheath over a guidewire. Balloon angioplasty of the right external iliac vein was then performed with a 10 mm x 4 cm Conquest balloon. Angiography was performed.  Additional balloon angioplasty was performed of the right common iliac vein with a 12 mm x 4 cm Atlas balloon.  Balloon angioplasty was followed by additional angiography. The sheath was removed and hemostasis obtained with application of a 2-0 Ethilon pursestring suture.  COMPLICATIONS: None  FINDINGS: The in loop graft in the right thigh is normally patent including arterial and venous anastomoses. The graft itself shows small focal pseudoaneurysms related to prior puncture sites. Venous outflow demonstrates focal stenosis of the distal external iliac vein of approximately 80%. Longer segmental stenosis of the common iliac vein approaches 80% in estimated caliber. The inferior vena cava is widely patent.  After 10 mm balloon angioplasty, there was improvement in appearance of the external iliac vein stenosis with mild residual roughly 25% stenosis present. There remained moderate degree of common iliac vein narrowing of approximately 60- 70%. After further 12 mm balloon angioplasty of the common iliac vein, there is further improvement in patency with only mild 20- 25% narrowing remaining. Excellent flow was present through the outflow veins on completion.   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 These were successfully treated with 10 mm and 12 mm balloon angioplasty with significant angiographic improvement, as above.  ACCESS: The graft and venous outflow remain amenable to percutaneous intervention.   Electronically Signed   By: Aletta Edouard M.D.   On: 04/16/2014 17:06   Medications: . sodium chloride 10 mL/hr at 04/15/14 2030   . aspirin EC  81 mg Oral Daily  . calcium acetate  2,001 mg Oral TID WC  . darbepoetin (ARANESP) injection - DIALYSIS  60 mcg Intravenous Q Thu-HD  . doxercalciferol  2 mcg Intravenous Q T,Th,Sa-HD  . ferric gluconate (FERRLECIT/NULECIT) IV  62.5 mg Intravenous Q Thu-HD  . gabapentin  100 mg Oral Daily  . heparin  5,000 Units Subcutaneous 3 times per day  . insulin aspart  0-5 Units Subcutaneous QHS  . insulin aspart  0-9 Units Subcutaneous TID WC  . insulin glargine  7 Units Subcutaneous BID WC  . loratadine  10 mg Oral Daily  . montelukast  10 mg Oral QHS  . multivitamin  1 tablet  Oral Daily  . pantoprazole  40 mg Oral QHS  . sodium chloride  10-40 mL Intracatheter Q12H  . sodium chloride  3 mL Intravenous Q12H

## 2014-04-18 NOTE — Progress Notes (Signed)
[facility]  [name] WLS:[id number] DOB: [birth year] DOA: 04/13/2014 PCP: [name], MD  Brief narrative: 43 yo BF PMHx Type 1 diabetes, failed kidney/pancreas transplant, chronic kidney disease on dialysis. Patient has a known history of repeated hospitalizations for complications from nonadherence to medical therapies prescribed. She reports missing dialysis on the Saturday before admission because her daughter was in an automobile accident. By the day of admission she had developed shortness of breath with cough that was productive but she denied fevers chills or chest discomfort.  She presented to the ER where chest x-ray revealed edema with possible pneumonia. Her O2 sats were 70 percent on room air. She was quite hyperglycemic with a glucose of 700 and an anion gap of 30. She was started on an insulin infusion while in the emergency department.  She has undergone dialysis on the date of admission with a total of 3980 mL removed with ultrafiltration. Her insulin drip has been discontinued in the setting of hypoglycemia that is now requiring dextrose 10 infusion. She did have persistent hypoglycemia with CBGs in the 40-50 range on dextrose 10 until around 8:00 am on 11/18 when CBG did increase to 93 and after diet was introduced CBGs increased at 346. Her anion gap was elevated at 17 but this is not unusual for her in the setting of her chronic kidney disease.  Follow-up chest x-ray postdialysis did not demonstrate any evidence of a pneumonia process. Blood cultures are pending and concerns were that given her presentation with DKA with subsequent persistent hypoglycemia that patient may have occult sepsis process. No convincing evidence she has pneumonia though and it was felt that her respiratory symptoms were from volume overload from missed dialysis.  By the evening of 11/19 she was transitioned to Lantus and thus far has remained stable. A  shuntogram of her HD access has been scheduled for 11/20 with HD planned again for 11/21.  HPI/Subjective: Reports numbness and weakness a little better   Assessment/Plan:    Acute respiratory failure with hypoxia due to volume overload -resolved -> 3900 cc removed via UF 11/17 with 2100 cc removed 11/19 -no clinical evidence to suggest PNA  -weaned off oxygen    ESRD (end stage renal disease) -per Nephro  -cont HD -shuntogram completed per IR 11/20    R arm numbness and weakness -reportedly for 4days but not documented on previous provider notes -on Neuro exam, mild weakness on R and diminished grip  -MRI still pending to be completed today, asa changed to 325mg , PT eval pending    DKA (diabetic ketoacidoses) and hypoglycemia -onset after admit and required D10 -11/18  CBG was > 300 on D10 so dc'd D10 and was transitioned to Lantus but at 1/2 home dose since she is usually very brittle -overnight into 11/19 went back into DKA (glucose 765, AG 26) so back on insulin drip  -pm of 11/19 transitioned back to Lantus, yesterday with episode of hypoglycemia, now resolved, monitor -has a well established hx of  brittle DM.    Hyperlipidemia  Ref. Range 04/15/2014 15:20  Cholesterol Latest Range: 0-200 mg/dL 166  Triglycerides Latest Range: <150 mg/dL 98  HDL Latest Range: >39 mg/dL 72  LDL (calc) Latest Range: 0-99 mg/dL 74  VLDL Latest Range: 0-40 mg/dL 20  Total CHOL/HDL Ratio No range found 2.3      GERD/Gastroparesis -cont PPI    RLS (restless legs syndrome)  Non compliance  DVT prophylaxis: subcutaneous heparin Code Status: full Family Communication: no family at bedside Disposition Plan/Expected LOS: home pending workup of Neuro complaints today Consultants: Nephrology/ Dr Jonnie Finner  Procedures: none  Antibiotics: Zosyn 11/17 > 11/18 Vancomycin 11/17 > 11/18  Objective: Blood pressure 135/67, pulse 88, temperature 98.4 F (36.9 C), temperature source  Oral, resp. rate 18, height 5\' 1"  (1.549 m), weight 68.601 kg (151 lb 3.8 oz), last menstrual period 03/26/2014, SpO2 95 %.  Intake/Output Summary (Last 24 hours) at 04/18/14 1036 Last data filed at 04/18/14 0908  Gross per 24 hour  Intake    600 ml  Output   2200 ml  Net  -1600 ml    Exam: Gen: No acute respiratory distress, AAOx3 Chest: Clear to auscultation bilaterally, 2L-weaning Cardiac: Regular rate and rhythm, S1-S2, no peripheral edema, no JVD Abdomen: Soft nontender nondistended without obvious hepatosplenomegaly, no ascites Extremities: Symmetrical in appearance without cyanosis, clubbing or effusion Neuro: RUE 3-4/5, RLE 3-4/5, unable to differentiate from poor effort, DTR 1plus, plantars mute  Scheduled Meds:  Scheduled Meds: . aspirin EC  81 mg Oral Daily  . calcium acetate  2,001 mg Oral TID WC  . darbepoetin (ARANESP) injection - DIALYSIS  60 mcg Intravenous Q Thu-HD  . doxercalciferol  2 mcg Intravenous Q T,Th,Sa-HD  . ferric gluconate (FERRLECIT/NULECIT) IV  62.5 mg Intravenous Q Thu-HD  . gabapentin  100 mg Oral Daily  . heparin  5,000 Units Subcutaneous 3 times per day  . insulin aspart  0-5 Units Subcutaneous QHS  . insulin aspart  0-9 Units Subcutaneous TID WC  . insulin glargine  7 Units Subcutaneous BID WC  . loratadine  10 mg Oral Daily  . montelukast  10 mg Oral QHS  . multivitamin  1 tablet Oral Daily  . pantoprazole  40 mg Oral QHS  . sodium chloride  10-40 mL Intracatheter Q12H  . sodium chloride  3 mL Intravenous Q12H    Data Reviewed: Basic Metabolic Panel:  Recent Labs Lab 04/15/14 0405 04/15/14 1520 04/15/14 2220 04/16/14 0300 04/16/14 0620 04/17/14 0535 04/18/14 0446  NA 127* 136* 135* 137 136* 135* 136*  K 5.3 3.6* 3.5* 3.7 3.7 4.3 3.9  CL 85* 94* 94* 96 96 92* 95*  CO2 16* 27 25 28 26 27 26   GLUCOSE 765* 128* 242* 224* 231* 266* 133*  BUN 48* 12 14 16 16  25* 19  CREATININE 8.70* 3.78* 4.88* 5.37* 5.72* 9.35* 6.55*  CALCIUM  7.9* 9.0 8.9 8.9 8.6 9.1 9.2  MG  --  1.9  --   --   --   --   --   PHOS 3.3  --   --   --   --   --   --    Liver Function Tests:  Recent Labs Lab 04/13/14 0752 04/15/14 0405  AST 13  --   ALT 8  --   ALKPHOS 115  --   BILITOT 0.7  --   PROT 7.9  --   ALBUMIN 3.5 2.5*   CBC:  Recent Labs Lab 04/13/14 0752 04/13/14 0802 04/13/14 1631 04/14/14 0208 04/15/14 0405 04/18/14 0446  WBC 9.4  --  9.8 7.4 5.5 4.5  NEUTROABS 8.3*  --   --   --   --   --   HGB 10.3* 12.6 9.9* 9.0* 8.5* 9.1*  HCT 32.7* 37.0 30.8* 27.6* 27.1* 28.8*  MCV 100.3*  --  96.6 94.8 98.2 96.6  PLT 169  --  153 195 158 167   CBG:  Recent Labs Lab 04/17/14 1714 04/17/14 1739 04/17/14 2043 04/17/14 2245 04/18/14 0713  GLUCAP 44* 79 267* 299* 127*    Recent Results (from the past 240 hour(s))  Blood culture (routine x 2)     Status: None (Preliminary result)   Collection Time: 04/13/14  8:25 AM  Result Value Ref Range Status   Specimen Description BLOOD RIGHT HAND  Final   Special Requests BOTTLES DRAWN AEROBIC ONLY 2MLS  Final   Culture  Setup Time   Final    04/13/2014 14:04 Performed at [facility]    Culture   Final           BLOOD CULTURE RECEIVED NO GROWTH TO DATE CULTURE WILL BE HELD FOR 5 DAYS BEFORE ISSUING A FINAL NEGATIVE REPORT Performed at [facility]    Report Status PENDING  Incomplete  Blood culture (routine x 2)     Status: None (Preliminary result)   Collection Time: 04/13/14  8:35 AM  Result Value Ref Range Status   Specimen Description BLOOD RIGHT NECK  Final   Special Requests   Final    BOTTLES DRAWN AEROBIC AND ANAEROBIC 5MLS RIGHT EJ LINES    Culture  Setup Time   Final    04/13/2014 14:04 Performed at [facility]    Culture   Final           BLOOD CULTURE RECEIVED NO GROWTH TO DATE CULTURE WILL BE HELD FOR 5 DAYS BEFORE ISSUING A FINAL NEGATIVE REPORT Performed at [facility]    Report Status PENDING  Incomplete  MRSA PCR  Screening     Status: None   Collection Time: 04/13/14  3:32 PM  Result Value Ref Range Status   MRSA by PCR NEGATIVE NEGATIVE Final    Comment:        The GeneXpert MRSA Assay (FDA approved for NASAL specimens only), is one component of a comprehensive MRSA colonization surveillance program. It is not intended to diagnose MRSA infection nor to guide or monitor treatment for MRSA infections.   Culture, routine-abscess     Status: None   Collection Time: 04/14/14  3:03 PM  Result Value Ref Range Status   Specimen Description ABSCESS LYMPH NODE  Final   Special Requests NONE  Final   Gram Stain   Final    RARE WBC PRESENT,BOTH PMN AND MONONUCLEAR NO SQUAMOUS EPITHELIAL CELLS SEEN NO ORGANISMS SEEN Performed at [facility]    Culture   Final    NO GROWTH 3 DAYS Performed at [facility]    Report Status 04/17/2014 FINAL  Final      If 7PM-7AM, please contact night-coverage [email or web address] Password [facility] 04/18/2014, 10:36 AM   LOS: 5 days   Domenic Polite, [place]

## 2014-04-18 NOTE — Progress Notes (Signed)
PT Cancellation Note  Patient Details Name: [name] MRN: [id number] DOB: 06-Jun-1970   Cancelled Treatment:     Patient eating breakfast upon first attempt 9:00, requested PT return later. Upon return to patient's room 9:45, patient in transit to MRI.  Will return to patient for PT eval as able later today.   Evora Schechter 04/18/2014, 9:42 AM  [name], [place]

## 2014-04-18 NOTE — Consult Note (Signed)
Referring Physician:  Dr [name]   Chief Complaint: Stroke  HPI: [name] is a 43 y.o. female with a history of end-stage renal disease, hyperlipidemia, migraine headaches, previous kidney and pancreas transplants, hypertension, diabetes mellitus, and bronchitis who was admitted to [facility] Tuesday 04/13/2014 with shortness of breath and a productive cough.   The patient reported yesterday that on that Tuesday she also developed a migraine headache associated with right-sided numbness and mild right hemiparesis. She has also noted some intermittent slurred speech. An MRI was performed today that was consistent with an acute 6 mm lacunar infarct in the left dorsal pons. A neurology consult was requested. The patient had been on aspirin 81 mg daily prior to admission. She is now on aspirin 325 mg daily. She still complaining of a migraine headache which she rates as an 8 on a 1-10 scale. Benadryl and Compazine have been ordered.   Time last known well: Unable to determine  The patient reports that the symptoms started on Tuesday 1117  tPA Given: No TPA - patient is out of the window for treatment.  Past Medical History  Diagnosis Date  . Hyperlipidemia   . Alopecia   . Obesity   . Iron deficiency   . ESRD (end stage renal disease)     S/p pancreatic and kidney transplant, but back on HD 2008, dialysis at [facility], [place], Sat  . RLS (restless legs syndrome)   . Injury of conjunctiva and corneal abrasion of right eye without foreign body   . Cellulitis   . Anemia   . Blood transfusion 2004    "when I had my transplant"  . Migraine   . Hyperparathyroidism, secondary   . Diabetic retinopathy   . Hypertension     takes Metoprolol and Exforge daily  . Depression     takes Klonopin nightly  . Diabetes mellitus type 1     Pt states diagnosed at age 39 with prior episodes of DKA. S/p pancreatic transplant   . GERD (gastroesophageal reflux disease)     takes Protonix  daily  . Bronchitis   . Migraine   . Asthma   . Emphysema of lung   . Angina     none in past 2 years.  . Stomach pain     Hx: of chronic  . Pneumonia 2012    "double"  . Dialysis patient     tues,thurs,sat  . 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  Surgery was done at [facility].       Past Surgical History  Procedure Laterality Date  . 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 Thyroglobulin      x 7  . Av fistula placement      right upper arm  . Cholecystectomy  1995  .  hd graft placement/removal      "had 2 in my left upper arm"  . Eye surgery    . Retinopathy surgery    . Tooth extraction  10/10/11    X's two  . Insertion of dialysis catheter  12/08/2011    Procedure: INSERTION OF DIALYSIS CATHETER;  Surgeon: Angelia Mould, MD;  Location: [place];  Service: Vascular;  Laterality: Left;  . Av  fistula placement  01/22/2012    Procedure: INSERTION OF ARTERIOVENOUS (AV) GORE-TEX GRAFT ARM;  Surgeon: Angelia Mould, MD;  Location: [place];  Service: Vascular;  Laterality: Left;  Ultrasound guided.  . Av fistula placement  03/14/2012    Procedure: INSERTION OF ARTERIOVENOUS (AV) GORE-TEX GRAFT THIGH;  Surgeon: Angelia Mould, MD;  Location: [place];  Service: Vascular;  Laterality: Right;  . False aneurysm repair Right 01/02/2013    Procedure: Excision of lymphocele in right thigh.;  Surgeon: Angelia Mould, MD;  Location: [place];  Service: Vascular;  Laterality: Right;  . Carpal tunnel release Right 03/02/2013    Procedure: CARPAL TUNNEL RELEASE;  Surgeon: Tennis Must, MD;  Location: [place];  Service: Orthopedics;  Laterality: Right;  . Flexible sigmoidoscopy N/A 03/24/2013    Procedure: FLEXIBLE  SIGMOIDOSCOPY;  Surgeon: Cleotis Nipper, MD;  Location: [facility] ENDOSCOPY;  Service: Endoscopy;  Laterality: N/A;  . Revision of arteriovenous goretex graft Right 04/02/2013    Procedure: REPAIR OF PSEUDOANEURYSM OF ARTERIOVENOUS GORETEX GRAFT;  Surgeon: Angelia Mould, MD;  Location: [place];  Service: Vascular;  Laterality: Right;  . Carpal tunnel release Left 10/16/2013    Procedure: LEFT CARPAL TUNNEL RELEASE;  Surgeon: Tennis Must, MD;  Location: [place];  Service: Orthopedics;  Laterality: Left;    Family History  Problem Relation Age of Onset  . Hypertension Mother   . Kidney disease Mother   . Diabetes Mother   . Hypertension Father   . Kidney disease Father   . Diabetes Father    Social History:  reports that she has never smoked. She has never used smokeless tobacco. She reports that she does not drink alcohol or use illicit drugs.  Allergies:  Allergies  Allergen Reactions  . Depakote [Divalproex Sodium] Other (See Comments)    Pt gets "delirious"  . Morphine And Related Nausea And Vomiting and Other (See Comments)    "makes me delirious"  . Penicillins Anaphylaxis    Tolerated Zosyn Dec 2014  . Tramadol Nausea And Vomiting  . Imitrex [Sumatriptan] Other (See Comments)    seizures  . Vicodin [Hydrocodone-Acetaminophen] Itching and Rash    Medications:  Scheduled: . aspirin  325 mg Oral Daily  . calcium acetate  2,001 mg Oral TID WC  . darbepoetin (ARANESP) injection - DIALYSIS  60 mcg Intravenous Q Thu-HD  . diphenhydrAMINE  12.5 mg Intravenous Once  . doxercalciferol  2 mcg Intravenous Q T,Th,Sa-HD  . ferric gluconate (FERRLECIT/NULECIT) IV  62.5 mg Intravenous Q Thu-HD  . gabapentin  100 mg Oral Daily  . heparin  5,000 Units Subcutaneous 3 times per day  . insulin aspart  0-5 Units Subcutaneous QHS  . insulin aspart  0-9 Units Subcutaneous TID WC  . insulin glargine  7 Units Subcutaneous BID WC  . loratadine  10 mg Oral Daily  .  montelukast  10 mg Oral QHS  . multivitamin  1 tablet Oral Daily  . pantoprazole  40 mg Oral QHS  . prochlorperazine  10 mg Intravenous Once  . sodium chloride  10-40 mL Intracatheter Q12H  . sodium chloride  3 mL Intravenous Q12H    ROS: History obtained from the patient  General ROS: negative for - chills, fatigue, fever, night sweats, weight gain or weight loss Psychological ROS: negative for - behavioral disorder, hallucinations, memory difficulties, mood swings or suicidal ideation Ophthalmic ROS: negative for - blurry vision, double vision, eye pain  or loss of vision ENT ROS: negative for - epistaxis, nasal discharge, oral lesions, sore throat, tinnitus or vertigo Allergy and Immunology ROS: negative for - hives or itchy/watery eyes Hematological and Lymphatic ROS: negative for - bleeding problems, bruising or swollen lymph nodes Endocrine ROS: negative for - galactorrhea, hair pattern changes, polydipsia/polyuria or temperature intolerance Respiratory ROS: negative for -  hemoptysis, or wheezing. Positive for a congested but nonproductive cough and shortness of breath. The patient uses oxygen on a prn basis at home.  Cardiovascular ROS: negative for - chest pain, dyspnea on exertion, edema or irregular heartbeat Gastrointestinal ROS: negative for - abdominal pain, diarrhea, hematemesis, nausea/vomiting or stool incontinence Genito-Urinary ROS: negative for - dysuria, hematuria, incontinence or urinary frequency/urgency. She produces no urine. Musculoskeletal ROS: negative for - joint swelling or muscular weakness Neurological ROS: as noted in HPI Dermatological ROS: negative for rash and skin lesion changes   Physical Examination: Blood pressure 135/67, pulse 88, temperature 98.4 F (36.9 C), temperature source Oral, resp. rate 18, height 5\' 1"  (1.549 m), weight 151 lb 3.8 oz (68.601 kg), last menstrual period 03/26/2014, SpO2 95 %.  Neurologic Examination: Mental  Status: Alert, oriented, thought content appropriate.  Speech fluent without evidence of aphasia.  Able to follow 3 step commands without difficulty. Cranial Nerves: II: Discs not visualized; Visual fields grossly normal, pupils equal, round, reactive to light and accommodation III,IV, VI: ptosis not present, extra-ocular motions intact bilaterally V,VII: smile symmetric, facial light touch sensation mildly decreased on the right VIII: hearing normal bilaterally IX,X: gag reflex present XI: bilateral shoulder shrug XII: midline tongue extension Motor: Right : Upper extremity   4/5    Left:     Upper extremity   5/5  Lower extremity   3/5     Lower extremity   5/5 Tone and bulk:normal tone throughout; no atrophy noted Sensory: Light touch decreased on the right upper and lower extremities. Deep Tendon Reflexes: 2+ and symmetric throughout Plantars: Right: downgoing   Left: downgoing Cerebellar: Mild dysmetria with finger to nose testing of the right upper extremity - normal on the left. Unable to perform heel-to-shin testing using the right lower extremity. Gait: Deferred   Laboratory Studies:  Basic Metabolic Panel:  Recent Labs Lab 04/15/14 0405 04/15/14 1520 04/15/14 2220 04/16/14 0300 04/16/14 0620 04/17/14 0535 04/18/14 0446  NA 127* 136* 135* 137 136* 135* 136*  K 5.3 3.6* 3.5* 3.7 3.7 4.3 3.9  CL 85* 94* 94* 96 96 92* 95*  CO2 16* 27 25 28 26 27 26   GLUCOSE 765* 128* 242* 224* 231* 266* 133*  BUN 48* 12 14 16 16  25* 19  CREATININE 8.70* 3.78* 4.88* 5.37* 5.72* 9.35* 6.55*  CALCIUM 7.9* 9.0 8.9 8.9 8.6 9.1 9.2  MG  --  1.9  --   --   --   --   --   PHOS 3.3  --   --   --   --   --   --     Liver Function Tests:  Recent Labs Lab 04/13/14 0752 04/15/14 0405  AST 13  --   ALT 8  --   ALKPHOS 115  --   BILITOT 0.7  --   PROT 7.9  --   ALBUMIN 3.5 2.5*   No results for input(s): LIPASE, AMYLASE in the last 168 hours. No results for input(s): AMMONIA in the  last 168 hours.  CBC:  Recent Labs Lab 04/13/14 0752 04/13/14 0802 04/13/14 1631 04/14/14  7026 04/15/14 0405 04/18/14 0446  WBC 9.4  --  9.8 7.4 5.5 4.5  NEUTROABS 8.3*  --   --   --   --   --   HGB 10.3* 12.6 9.9* 9.0* 8.5* 9.1*  HCT 32.7* 37.0 30.8* 27.6* 27.1* 28.8*  MCV 100.3*  --  96.6 94.8 98.2 96.6  PLT 169  --  153 195 158 167    Cardiac Enzymes: No results for input(s): CKTOTAL, CKMB, CKMBINDEX, TROPONINI in the last 168 hours.  BNP: Invalid input(s): POCBNP  CBG:  Recent Labs Lab 04/17/14 1739 04/17/14 2043 04/17/14 2245 04/18/14 0713 04/18/14 1148  GLUCAP 79 267* 299* 127* 189*    Microbiology: Results for orders placed or performed during the hospital encounter of 04/13/14  Blood culture (routine x 2)     Status: None (Preliminary result)   Collection Time: 04/13/14  8:25 AM  Result Value Ref Range Status   Specimen Description BLOOD RIGHT HAND  Final   Special Requests BOTTLES DRAWN AEROBIC ONLY 2MLS  Final   Culture  Setup Time   Final    04/13/2014 14:04 Performed at [facility]    Culture   Final           BLOOD CULTURE RECEIVED NO GROWTH TO DATE CULTURE WILL BE HELD FOR 5 DAYS BEFORE ISSUING A FINAL NEGATIVE REPORT Performed at [facility]    Report Status PENDING  Incomplete  Blood culture (routine x 2)     Status: None (Preliminary result)   Collection Time: 04/13/14  8:35 AM  Result Value Ref Range Status   Specimen Description BLOOD RIGHT NECK  Final   Special Requests   Final    BOTTLES DRAWN AEROBIC AND ANAEROBIC 5MLS RIGHT EJ LINES    Culture  Setup Time   Final    04/13/2014 14:04 Performed at [facility]    Culture   Final           BLOOD CULTURE RECEIVED NO GROWTH TO DATE CULTURE WILL BE HELD FOR 5 DAYS BEFORE ISSUING A FINAL NEGATIVE REPORT Performed at [facility]    Report Status PENDING  Incomplete  MRSA PCR Screening     Status: None   Collection Time: 04/13/14  3:32 PM  Result  Value Ref Range Status   MRSA by PCR NEGATIVE NEGATIVE Final    Comment:        The GeneXpert MRSA Assay (FDA approved for NASAL specimens only), is one component of a comprehensive MRSA colonization surveillance program. It is not intended to diagnose MRSA infection nor to guide or monitor treatment for MRSA infections.   Culture, routine-abscess     Status: None   Collection Time: 04/14/14  3:03 PM  Result Value Ref Range Status   Specimen Description ABSCESS LYMPH NODE  Final   Special Requests NONE  Final   Gram Stain   Final    RARE WBC PRESENT,BOTH PMN AND MONONUCLEAR NO SQUAMOUS EPITHELIAL CELLS SEEN NO ORGANISMS SEEN Performed at [facility]    Culture   Final    NO GROWTH 3 DAYS Performed at [facility]    Report Status 04/17/2014 FINAL  Final    Coagulation Studies: No results for input(s): LABPROT, INR in the last 72 hours.  Urinalysis: No results for input(s): COLORURINE, LABSPEC, PHURINE, GLUCOSEU, HGBUR, BILIRUBINUR, KETONESUR, PROTEINUR, UROBILINOGEN, NITRITE, LEUKOCYTESUR in the last 168 hours.  Invalid input(s): APPERANCEUR  Lipid Panel:  Component Value Date/Time   CHOL 166 04/15/2014 1520   TRIG 98 04/15/2014 1520   HDL 72 04/15/2014 1520   CHOLHDL 2.3 04/15/2014 1520   VLDL 20 04/15/2014 1520   LDLCALC 74 04/15/2014 1520    HgbA1C:  Lab Results  Component Value Date   HGBA1C 11.4* 11/27/2013    Urine Drug Screen:  No results found for: LABOPIA, COCAINSCRNUR, LABBENZ, AMPHETMU, THCU, LABBARB  Alcohol Level: No results for input(s): ETH in the last 168 hours.  Other results: EKG: Sinus rhythm rate 90 bpm. Please refer to the formal cardiology reading for complete details.  Imaging:  Mr [name] Contrast 04/18/2014    1. Acute 6 mm lacunar infarct in the left dorsal pons. No mass effect or hemorrhage.  2. Other evidence of chronic small vessel disease including left globus pallidus chronic lacune, possible  chronic micro hemorrhages left frontal lobe.  3. Negative intracranial MRA, but tortuous cervical ICAs raising the possibility of chronic hypertension.     Ir Pta Venous Right 04/16/2014    Significant stenoses of venous outflow at the level of the right external and common iliac veins. These were successfully treated with 10 mm and 12 mm balloon angioplasty with significant angiographic improvement, as above.  ACCESS: The graft and venous outflow remain amenable to percutaneous intervention.      Ir Shuntogram/ Fistulagram Right Mod Sed 04/16/2014    Significant stenoses of venous outflow at the level of the right external and common iliac veins. These were successfully treated with 10 mm and 12 mm balloon angioplasty with significant angiographic improvement, as above.  ACCESS: The graft and venous outflow remain amenable to percutaneous intervention.       Assessment: 43 y.o. female with multiple medical problems as described above who was admitted to [facility] last Tuesday, 04/13/2014 with cough and shortness of breath. She was also experiencing a migraine headache with right hemiparesis and hemisensory deficits. The patient did not inform the staff about her right-sided symptoms until yesterday. An MRI today confirmed an acute 6 mm lacunar infarct in the left dorsal pons most likely secondary to small vessel disease and possibly related to her migraine headache.  Stroke Risk Factors - diabetes mellitus, family history, hyperlipidemia, hypertension and Obesity  Plan: 1. HgbA1c, fasting lipid panel 2. MRI, MRA  of the brain without contrast - completed 3. PT consult, OT consult, Speech consult 4. Echocardiogram 5. Carotid dopplers 6. Prophylactic therapy-Antiplatelet med: Aspirin - dose 325 mg daily 7. NPO until RN stroke swallow screen 8. Telemetry monitoring 9. Frequent neuro checks  Lowry Ram [facility] Pager [phone] 04/18/2014, 5:01  PM  I have seen and evaluated the patient. I have reviewed the above note and made appropriate changes.  Small sbcortical stroke with right sided weakness. Likely small vessel disease. Workup as above.   Roland Rack, MD [facility] [phone]  If 7pm- 7am, please page neurology on call as listed in [place].

## 2014-04-18 NOTE — Progress Notes (Signed)
Patient placed on NPO per neurology consult's notes until swallow eval done.

## 2014-04-18 NOTE — Plan of Care (Signed)
Problem: Phase III Progression Outcomes Goal: Tolerating PO carb modified diet Outcome: Adequate for Discharge Goal: Activity at appropriate level-compared to baseline (UP IN CHAIR FOR HEMODIALYSIS)  Outcome: Progressing Goal: Discharge plan remains appropriate-arrangements made Outcome: Progressing Goal: Other Phase III Outcomes/Goals Outcome: Not Progressing  Problem: Discharge Progression Outcomes Goal: CBGs controlled on DM discharge meds Outcome: Not Progressing Goal: Pain controlled with appropriate interventions Outcome: Progressing Goal: Hemodynamically stable Outcome: Completed/Met Date Met:  04/18/14 Goal: Tolerating diet Outcome: Completed/Met Date Met:  04/18/14

## 2014-04-18 NOTE — Evaluation (Signed)
Physical Therapy Evaluation Patient Details Name: [name] MRN: [id number] DOB: 1970/09/18 Today's Date: 04/18/2014   History of Present Illness  43 years old female adm 01/14/14 with  weakness and dizziness. Found to be in severe DKA.  PMH relevant for DM type 1, ESRD post kidney and pancreas transplant but now back on HD, HTN.  She reports Rt side body weakness and heaviness  Clinical Impression  Patient presents with Rt side weakness and decr mobility compared to baseline status.  Her mobility today was limited by pain Rt side of body. She was tearful during session and she expressed worry for her current state.  She will have short term assist upon discharge home via friend and boyfriend.  Discharge services dependent upon rate of progress while on acute.  Patient may benefit from skilled therapy while in hospital to address strength, balance, gait, and general mobililty to enable patient to discharge home alone with intermittent assistance for instrumental ADLS.    Follow Up Recommendations No PT follow up;Home health PT (depending on rate of progress)    Equipment Recommendations  None recommended by PT    Recommendations for Other Services       Precautions / Restrictions Precautions Precautions: Fall Restrictions Weight Bearing Restrictions: No      Mobility  Bed Mobility Overal bed mobility: Independent                Transfers Overall transfer level: Needs assistance Equipment used: 1 person hand held assist Transfers: Sit to/from Stand;Stand Pivot Transfers Sit to Stand: Min guard Stand pivot transfers: Min assist       General transfer comment: decr right LE weight bearing  Ambulation/Gait Ambulation/Gait assistance: Min assist Ambulation Distance (Feet): 40 Feet Assistive device: Rolling walker (2 wheeled);1 person hand held assist (used RW close supervision after patient needed min hand hold) Gait Pattern/deviations: Shuffle;Decreased step  length - right;Decreased stance time - right;Decreased dorsiflexion - right;Decreased weight shift to right;Antalgic     General Gait Details: pain with weight bearing Rt leg  Stairs            Wheelchair Mobility    Modified Rankin (Stroke Patients Only)       Balance Overall balance assessment: Needs assistance Sitting-balance support: No upper extremity supported Sitting balance-Leahy Scale: Normal     Standing balance support: Single extremity supported Standing balance-Leahy Scale: Fair                               Pertinent Vitals/Pain Pain Assessment: 0-10 Pain Score: 4  Pain Location: right side of body Pain Descriptors / Indicators: Heaviness;Throbbing (worsened with activity ) Pain Intervention(s): Limited activity within patient's tolerance;Monitored during session    [place] expects to be discharged to:: Private residence Living Arrangements: Alone Available Help at Discharge: Friend(s);Available PRN/intermittently (boyfriend travels out of town for week for days at a time) Type of Home: Apartment ("studio" per patient) Home Access: Level entry     Home Layout: One level Home Equipment: None Additional Comments: will stay with her friend after d/c short term until her boyfriend returns to town on Sunday    Prior Function Level of Independence: Independent         Comments: boyfriend would drive her for grocery shopping (works seasonally on taxes)     Journalist, newspaper   Dominant Hand: Right    Extremity/Trunk Assessment   Upper Extremity Assessment: Generalized weakness  Lower Extremity Assessment: RLE deficits/detail RLE Deficits / Details: <3/5 hip flexors, knee extensors, ankle dorsiflexors    Cervical / Trunk Assessment: Normal  Communication      Cognition Arousal/Alertness: Awake/alert Behavior During Therapy: WFL for tasks assessed/performed Overall Cognitive Status: Within  Functional Limits for tasks assessed                      General Comments      Exercises General Exercises - Lower Extremity Ankle Circles/Pumps: AROM;Right;10 reps;Seated Long Arc Quad: AROM;Both;10 reps;Seated Hip ABduction/ADduction: AROM;Both;10 reps;Seated Hip Flexion/Marching: AROM;10 reps;Both;Seated      Assessment/Plan    PT Assessment Patient needs continued PT services  PT Diagnosis Difficulty walking;Generalized weakness   PT Problem List Decreased strength;Decreased activity tolerance;Decreased balance;Decreased mobility;Decreased knowledge of use of DME  PT Treatment Interventions DME instruction;Gait training;Stair training;Functional mobility training;Therapeutic activities;Therapeutic exercise;Balance training;Patient/family education   PT Goals (Current goals can be found in the Care Plan section) Acute Rehab PT Goals Patient Stated Goal: regain strength, reduce pain PT Goal Formulation: With patient Time For Goal Achievement: 04/25/14 Potential to Achieve Goals: Good    Frequency Min 3X/week   Barriers to discharge        Co-evaluation               End of Session   Activity Tolerance: Patient limited by pain Patient left: in bed;with call bell/phone within reach Nurse Communication: Mobility status         Time: [id number] PT Time Calculation (min) (ACUTE ONLY): 29 min   Charges:   PT Evaluation $Initial PT Evaluation Tier I: 1 Procedure PT Treatments $Gait Training: 8-22 mins $Therapeutic Exercise: 8-22 mins   PT G Codes:          Margel Joens 05-18-2014, 12:00 PM  [name], [place]

## 2014-04-19 DIAGNOSIS — I6302 Cerebral infarction due to thrombosis of basilar artery: Secondary | ICD-10-CM | POA: Insufficient documentation

## 2014-04-19 DIAGNOSIS — I6350 Cerebral infarction due to unspecified occlusion or stenosis of unspecified cerebral artery: Secondary | ICD-10-CM | POA: Diagnosis present

## 2014-04-19 DIAGNOSIS — I639 Cerebral infarction, unspecified: Secondary | ICD-10-CM | POA: Diagnosis present

## 2014-04-19 DIAGNOSIS — I517 Cardiomegaly: Secondary | ICD-10-CM

## 2014-04-19 LAB — RENAL FUNCTION PANEL
ALBUMIN: 2.8 g/dL — AB (ref 3.5–5.2)
ANION GAP: 13 (ref 5–15)
BUN: 35 mg/dL — ABNORMAL HIGH (ref 6–23)
CHLORIDE: 94 meq/L — AB (ref 96–112)
CO2: 27 meq/L (ref 19–32)
CREATININE: 9.41 mg/dL — AB (ref 0.50–1.10)
Calcium: 9.5 mg/dL (ref 8.4–10.5)
GFR calc non Af Amer: 5 mL/min — ABNORMAL LOW (ref >90)
GFR, EST AFRICAN AMERICAN: 5 mL/min — AB (ref >90)
Glucose, Bld: 162 mg/dL — ABNORMAL HIGH (ref 70–99)
Phosphorus: 2.8 mg/dL (ref 2.3–4.6)
Potassium: 4.4 mEq/L (ref 3.7–5.3)
Sodium: 134 mEq/L — ABNORMAL LOW (ref 137–147)

## 2014-04-19 LAB — CBC
HCT: 29.3 % — ABNORMAL LOW (ref 36.0–46.0)
Hemoglobin: 9.3 g/dL — ABNORMAL LOW (ref 12.0–15.0)
MCH: 31 pg (ref 26.0–34.0)
MCHC: 31.7 g/dL (ref 30.0–36.0)
MCV: 97.7 fL (ref 78.0–100.0)
Platelets: 188 10*3/uL (ref 150–400)
RBC: 3 MIL/uL — AB (ref 3.87–5.11)
RDW: 14.5 % (ref 11.5–15.5)
WBC: 6 10*3/uL (ref 4.0–10.5)

## 2014-04-19 LAB — CULTURE, BLOOD (ROUTINE X 2)
Culture: NO GROWTH
Culture: NO GROWTH

## 2014-04-19 LAB — GLUCOSE, CAPILLARY
GLUCOSE-CAPILLARY: 331 mg/dL — AB (ref 70–99)
Glucose-Capillary: 108 mg/dL — ABNORMAL HIGH (ref 70–99)
Glucose-Capillary: 322 mg/dL — ABNORMAL HIGH (ref 70–99)

## 2014-04-19 LAB — HEMOGLOBIN A1C
HEMOGLOBIN A1C: 12 % — AB (ref <5.7)
MEAN PLASMA GLUCOSE: 298 mg/dL — AB (ref <117)

## 2014-04-19 MED ORDER — LIDOCAINE-PRILOCAINE 2.5-2.5 % EX CREA: 1.0000 "application " | TOPICAL_CREAM | CUTANEOUS | Status: DC | PRN

## 2014-04-19 MED ORDER — HEPARIN SODIUM (PORCINE) 1000 UNIT/ML DIALYSIS
1000.0000 [IU] | INTRAMUSCULAR | Status: DC | PRN
  Filled 2014-04-19: qty 1

## 2014-04-19 MED ORDER — ALTEPLASE 2 MG IJ SOLR
2.0000 mg | Freq: Once | INTRAMUSCULAR | Status: DC | PRN
  Filled 2014-04-19: qty 2

## 2014-04-19 MED ORDER — DIPHENHYDRAMINE HCL 50 MG/ML IJ SOLN
25.0000 mg | Freq: Once | INTRAMUSCULAR | Status: AC
  Administered 2014-04-19: 25 mg via INTRAVENOUS
  Filled 2014-04-19: qty 1

## 2014-04-19 MED ORDER — SODIUM CHLORIDE 0.9 % IV SOLN: 100.0000 mL | INTRAVENOUS | Status: DC | PRN

## 2014-04-19 MED ORDER — PENTAFLUOROPROP-TETRAFLUOROETH EX AERO: 1.0000 "application " | INHALATION_SPRAY | CUTANEOUS | Status: DC | PRN

## 2014-04-19 MED ORDER — ONDANSETRON HCL 4 MG/2ML IJ SOLN
INTRAMUSCULAR | Status: AC
  Filled 2014-04-19: qty 2

## 2014-04-19 MED ORDER — DOXERCALCIFEROL 4 MCG/2ML IV SOLN
INTRAVENOUS | Status: AC
  Filled 2014-04-19: qty 2

## 2014-04-19 MED ORDER — NEPRO/CARBSTEADY PO LIQD: 237.0000 mL | ORAL | Status: DC | PRN

## 2014-04-19 MED ORDER — PROCHLORPERAZINE EDISYLATE 5 MG/ML IJ SOLN
5.0000 mg | Freq: Once | INTRAMUSCULAR | Status: AC
  Administered 2014-04-19: 5 mg via INTRAVENOUS
  Filled 2014-04-19: qty 1

## 2014-04-19 MED ORDER — LIDOCAINE HCL (PF) 1 % IJ SOLN: 5.0000 mL | INTRAMUSCULAR | Status: DC | PRN

## 2014-04-19 NOTE — Plan of Care (Signed)
Problem: Phase II Progression Outcomes Goal: Tolerating PO clear liquid diet Outcome: Completed/Met Date Met:  04/19/14

## 2014-04-19 NOTE — Progress Notes (Signed)
Physical Therapy Treatment Patient Details Name: [name] MRN: [id number] DOB: 1971-03-19 Today's Date: 04/19/2014    History of Present Illness Pt admitted with SOB, cough and hypoxia due to PNA with hyperglycemia.  PMH:  ESRD, DM.    PT Comments    Pt progressing well towards physical therapy goals. Pt was able to demonstrate improvement in gait pattern, and was educated on therapeutic exercise to strengthen DF in the R ankle, as well as hip/knee strength in the RLE. Will continue to follow and progress as able per POC.   Follow Up Recommendations  No PT follow up;Supervision - Intermittent     Equipment Recommendations  Rolling walker with 5" wheels    Recommendations for Other Services       Precautions / Restrictions Precautions Precautions: Fall Restrictions Weight Bearing Restrictions: No    Mobility  Bed Mobility Overal bed mobility: Independent                Transfers Overall transfer level: Needs assistance Equipment used: None Transfers: Sit to/from Stand Sit to Stand: Supervision         General transfer comment: Pt able to power-up to full standing with no physical assistance and no LOB.   Ambulation/Gait Ambulation/Gait assistance: Min guard;Supervision Ambulation Distance (Feet): 120 Feet Assistive device: Rolling walker (2 wheeled) Gait Pattern/deviations: Step-through pattern;Decreased stride length;Decreased dorsiflexion - right Gait velocity: Decreased Gait velocity interpretation: Below normal speed for age/gender General Gait Details: Pt showing decreased step/stride length on the R when first began ambulating, however was able to fall into a more equal stride with cueing. Pt was also cued for walker placement close to pt's body, and increased DF on the R side.    Stairs            Wheelchair Mobility    Modified Rankin (Stroke Patients Only)       Balance Overall balance assessment: Needs  assistance Sitting-balance support: Feet supported;No upper extremity supported Sitting balance-Leahy Scale: Normal     Standing balance support: No upper extremity supported;During functional activity Standing balance-Leahy Scale: Fair                      Cognition Arousal/Alertness: Awake/alert Behavior During Therapy: WFL for tasks assessed/performed Overall Cognitive Status: Within Functional Limits for tasks assessed                      Exercises General Exercises - Upper Extremity Shoulder Flexion: Strengthening;Right;5 reps;Seated;Theraband Theraband Level (Shoulder Flexion): Level 2 (Red) Shoulder Extension: Strengthening;Right;10 reps;Seated;Theraband Theraband Level (Shoulder Extension): Level 2 (Red) Shoulder Horizontal ABduction: Strengthening;10 reps;Seated;Theraband;Right Theraband Level (Shoulder Horizontal Abduction): Level 2 (Red) Shoulder Horizontal ADduction: Strengthening;Right Elbow Flexion: Strengthening;Right;10 reps;Seated;Theraband Theraband Level (Elbow Flexion): Level 2 (Red) Elbow Extension: Strengthening;Right;10 reps;Seated;Theraband Theraband Level (Elbow Extension): Level 2 (Red) General Exercises - Lower Extremity Ankle Circles/Pumps: 20 reps Quad Sets: 10 reps Long Arc Quad: 10 reps Hip ABduction/ADduction: 10 reps Straight Leg Raises: 10 reps Hand Exercises Composite Extension: Strengthening;Right;5 reps (tputty) Digit Composite Abduction: Strengthening;5 reps;Other (comment);Right (tputty) Opposition: Strengthening;Right;5 reps;Seated;Other (comment) (tputty)    General Comments        Pertinent Vitals/Pain Pain Assessment: No/denies pain Pain Location: States her R knee is feeling stiff however does not describe it as painful.     Home Living Family/patient expects to be discharged to:: Private residence Living Arrangements: Alone Available Help at Discharge: Friend(s);Available PRN/intermittently Type of Home:  Apartment (studio apartment) Home Access:  Level entry   Home Layout: One level Home Equipment: None Additional Comments: can stay with a friend short term while boyfriend is working out of town    Prior Function Level of Independence: Independent      Comments: pt does not drive   PT Goals (current goals can now be found in the care plan section) Acute Rehab PT Goals Patient Stated Goal: regain R side coordination PT Goal Formulation: With patient Time For Goal Achievement: 04/25/14 Potential to Achieve Goals: Good Progress towards PT goals: Progressing toward goals    Frequency  Min 3X/week    PT Plan Current plan remains appropriate    Co-evaluation             End of Session Equipment Utilized During Treatment: Gait belt Activity Tolerance: Patient tolerated treatment well Patient left: in chair;with call bell/phone within reach     Time: 5726-2035 PT Time Calculation (min) (ACUTE ONLY): 23 min  Charges:  $Gait Training: 8-22 mins $Therapeutic Exercise: 8-22 mins                    G Codes:      Rolinda Roan 04/24/14, 5:27 PM   Rolinda Roan, PT, DPT Acute Rehabilitation Services Pager: [phone]

## 2014-04-19 NOTE — Progress Notes (Signed)
Subjective:     Pt seen on dialysis MRI yesterday confirmed a dorsal left pontine lacunar stroke "bummed out"  Objective Filed Vitals:   04/18/14 1754 04/18/14 2051 04/19/14 0516 04/19/14 0710  BP: 129/71 115/62 131/68 142/73  Pulse: 78 84 93 94  Temp: 98.7 F (37.1 C) 97.9 F (36.6 C) 98.4 F (36.9 C) 98.5 F (36.9 C)  TempSrc: Oral Oral Oral Oral  Resp: 18 18 18 15   Height:      Weight:  68.493 kg (151 lb)  72.2 kg (159 lb 2.8 oz)  SpO2: 99% 97% 100% 100%   Physical Exam General: alert and oriented. No acute distress. Seen on Dialysis Heart: RRR 2/6 systolic upper sternal border.  Line in right neck. Lungs: Anteriorly clear Abdomen: soft, nontender +BS Extremities: trace RLE edema. Weaker R hand grasp vs left  and weak dorsi and plantar flex.  Unable to fully lift R arm and leg against gravity. Dialysis Access: R thigh AVg currently cannulated  Additional Objective Labs:  LABS FROM TODAY ARE ALL STILL PENDING Basic Metabolic Panel:  Recent Labs Lab 04/15/14 0405  04/16/14 0620 04/17/14 0535 04/18/14 0446  NA 127*  < > 136* 135* 136*  K 5.3  < > 3.7 4.3 3.9  CL 85*  < > 96 92* 95*  CO2 16*  < > 26 27 26   GLUCOSE 765*  < > 231* 266* 133*  BUN 48*  < > 16 25* 19  CREATININE 8.70*  < > 5.72* 9.35* 6.55*  CALCIUM 7.9*  < > 8.6 9.1 9.2  PHOS 3.3  --   --   --   --   < > = values in this interval not displayed.    Recent Labs Lab 04/13/14 0752 04/15/14 0405  AST 13  --   ALT 8  --   ALKPHOS 115  --   BILITOT 0.7  --   PROT 7.9  --   ALBUMIN 3.5 2.5*   No results for input(s): LIPASE, AMYLASE in the last 168 hours. CBC:  Recent Labs Lab 04/13/14 0752  04/13/14 1631 04/14/14 0208 04/15/14 0405 04/18/14 0446  WBC 9.4  --  9.8 7.4 5.5 4.5  NEUTROABS 8.3*  --   --   --   --   --   HGB 10.3*  < > 9.9* 9.0* 8.5* 9.1*  HCT 32.7*  < > 30.8* 27.6* 27.1* 28.8*  MCV 100.3*  --  96.6 94.8 98.2 96.6  PLT 169  --  153 195 158 167  < > = values in this  interval not displayed.  Medications: . sodium chloride 10 mL/hr at 04/15/14 2030   . aspirin  325 mg Oral Daily  . calcium acetate  2,001 mg Oral TID WC  . darbepoetin (ARANESP) injection - DIALYSIS  60 mcg Intravenous Q Thu-HD  . doxercalciferol  2 mcg Intravenous Q T,Th,Sa-HD  . ferric gluconate (FERRLECIT/NULECIT) IV  62.5 mg Intravenous Q Thu-HD  . gabapentin  100 mg Oral Daily  . heparin  5,000 Units Subcutaneous 3 times per day  . insulin aspart  0-5 Units Subcutaneous QHS  . insulin aspart  0-9 Units Subcutaneous TID WC  . insulin glargine  7 Units Subcutaneous BID WC  . loratadine  10 mg Oral Daily  . montelukast  10 mg Oral QHS  . multivitamin  1 tablet Oral Daily  . pantoprazole  40 mg Oral QHS  . sodium chloride  10-40 mL Intracatheter Q12H  .  sodium chloride  3 mL Intravenous Q12H   Studies/Results: Mr [name] Contrast  04/18/2014   CLINICAL DATA:  43 year old female with acute numbness and tingling in the right side extremity for 5 days. Initial encounter.  EXAM: MRI HEAD WITHOUT CONTRAST  MRA HEAD WITHOUT CONTRAST  TECHNIQUE: Multiplanar, multiecho pulse sequences of the brain and surrounding structures were obtained without intravenous contrast. Angiographic images of the head were obtained using MRA technique without contrast.  COMPARISON:  Non contrast head CTs 06/27/2013 and earlier.  FINDINGS: MRI HEAD FINDINGS  6 mm focus of restricted diffusion in the dorsal pons on the left (series 4, image 9). Associated mild T2 and FLAIR hyperintensity. No associated mass effect or hemorrhage.  No other restricted diffusion. Major intracranial vascular flow voids are preserved.  No midline shift, mass effect, evidence of mass lesion, ventriculomegaly, extra-axial collection or acute intracranial hemorrhage. Cervicomedullary junction and pituitary are within normal limits. Chronic lacunar been *SCRATCH* sec chronic lacunar infarct in the left globus pallidus. Mild nonspecific  mostly periventricular cerebral white matter T2 and FLAIR hyperintensity possible chronic micro hemorrhages in the anterior left frontal lobe (series 8, image 15). No cortical encephalomalacia. Cerebellum within normal limits. Visible internal auditory structures appear normal.  Trace left mastoid effusion. Negative visualized nasopharynx. Other Visualized paranasal sinuses and mastoids are clear. Visualized orbit soft tissues are within normal limits. Visualized scalp soft tissues are within normal limits.  Nonspecific decreased T1 bone marrow signal throughout.  MRA HEAD FINDINGS  Antegrade flow in the posterior circulation. Codominant distal vertebral arteries. Dominant, normal AICA origins. No basilar stenosis. SCA and PCA origins are normal. Normal posterior communicating arteries. Bilateral PCA branches are normal.  Antegrade flow in both ICA siphons. Tortuous distal cervical ICAs, more so the right 8. No siphon stenosis. Ophthalmic and posterior communicating artery origins are within normal limits. Normal carotid termini, MCA and ACA origins.  Anterior communicating artery and visualized bilateral ACA and MCA branches are within normal limits.  IMPRESSION: 1. Acute 6 mm lacunar infarct in the left dorsal pons. No mass effect or hemorrhage. 2. Other evidence of chronic small vessel disease including left globus pallidus chronic lacune, possible chronic micro hemorrhages left frontal lobe. 3. Negative intracranial MRA, but tortuous cervical ICAs raising the possibility of chronic hypertension.   Electronically Signed   By: Lars Pinks M.D.   On: 04/18/2014 11:27   Mr [name] Contrast  04/18/2014   CLINICAL DATA:  43 year old female with acute numbness and tingling in the right side extremity for 5 days. Initial encounter.  EXAM: MRI HEAD WITHOUT CONTRAST  MRA HEAD WITHOUT CONTRAST  TECHNIQUE: Multiplanar, multiecho pulse sequences of the brain and surrounding structures were obtained without intravenous  contrast. Angiographic images of the head were obtained using MRA technique without contrast.  COMPARISON:  Non contrast head CTs 06/27/2013 and earlier.  FINDINGS: MRI HEAD FINDINGS  6 mm focus of restricted diffusion in the dorsal pons on the left (series 4, image 9). Associated mild T2 and FLAIR hyperintensity. No associated mass effect or hemorrhage.  No other restricted diffusion. Major intracranial vascular flow voids are preserved.  No midline shift, mass effect, evidence of mass lesion, ventriculomegaly, extra-axial collection or acute intracranial hemorrhage. Cervicomedullary junction and pituitary are within normal limits. Chronic lacunar been *SCRATCH* sec chronic lacunar infarct in the left globus pallidus. Mild nonspecific mostly periventricular cerebral white matter T2 and FLAIR hyperintensity possible chronic micro hemorrhages in the anterior left frontal lobe (series 8, image  15). No cortical encephalomalacia. Cerebellum within normal limits. Visible internal auditory structures appear normal.  Trace left mastoid effusion. Negative visualized nasopharynx. Other Visualized paranasal sinuses and mastoids are clear. Visualized orbit soft tissues are within normal limits. Visualized scalp soft tissues are within normal limits.  Nonspecific decreased T1 bone marrow signal throughout.  MRA HEAD FINDINGS  Antegrade flow in the posterior circulation. Codominant distal vertebral arteries. Dominant, normal AICA origins. No basilar stenosis. SCA and PCA origins are normal. Normal posterior communicating arteries. Bilateral PCA branches are normal.  Antegrade flow in both ICA siphons. Tortuous distal cervical ICAs, more so the right 8. No siphon stenosis. Ophthalmic and posterior communicating artery origins are within normal limits. Normal carotid termini, MCA and ACA origins.  Anterior communicating artery and visualized bilateral ACA and MCA branches are within normal limits.  IMPRESSION: 1. Acute 6 mm  lacunar infarct in the left dorsal pons. No mass effect or hemorrhage. 2. Other evidence of chronic small vessel disease including left globus pallidus chronic lacune, possible chronic micro hemorrhages left frontal lobe. 3. Negative intracranial MRA, but tortuous cervical ICAs raising the possibility of chronic hypertension.   Electronically Signed   By: Lars Pinks M.D.   On: 04/18/2014 11:27   HD: TTS NW F160 3h 41min, 68.5kh 2/2.5 Bath R thigh AVG (PTA this adm 11/20) Heparin none Hect 2 ug TIW, Aranesp 25 mcg weekly Increased to 60 this adm  Venofer 50/wk maint  Assessment/Plan: 1. Pulm edema- resolved.  2. R sided pain and numbness- d/t left pontine stroke. Sx were present for 4 days per neuro, out of window for TPA. Passed swallow eval. Home Health PT recommended at discharge, ? CIR while here?. 2. ESRD -HD TTS outpt. HD today (Mon) on holiday schedule) Shuntogram w PTA 11/20 of right external and common iliac veins. Weight up 4 kg if correct 3. Anemia - hgb 9.1 aranesp increased to 60. Cont Fe.  4. Secondary hyperparathyroidism - Last Ca+ 9.2 cont hectorol phos 3.3 no binder 5. HTN/volume - 135/67, Floor weight up 4 kg from yesterday ??  6. Nutrition - renal/ carb modified. vitamin 7. DM -per primary  8. Dispo - the stroke (in terms of level of care and home needs may impact on her discharge plan. She is currently residing at an [employer] with no home/place of her own to go home to...  Jamal Maes, MD [facility] [phone] Pager 04/19/2014, 7:54 AM

## 2014-04-19 NOTE — Progress Notes (Signed)
  Echocardiogram 2D Echocardiogram has been performed.  Darlina Sicilian M 04/19/2014, 12:29 PM

## 2014-04-19 NOTE — Care Management Note (Signed)
CARE MANAGEMENT NOTE 04/19/2014  Patient:  HEIDY, MCCUBBIN   Account Number:  [id number]  Date Initiated:  04/13/2014  Documentation initiated by:  MAYO,HENRIETTA  Subjective/Objective Assessment:   dx PNA/DKA    PCP  Jennette Kettle     Action/Plan:   04/19/2014 Sm CVA on 04/18/14 now with rt sided weakness.   Anticipated DC Date:  04/21/2014   Anticipated DC Plan:  [place]  CM consult      Choice offered to / List presented to:             Status of service:  In process, will continue to follow Medicare Important Message given?  YES (If response is "NO", the following Medicare IM given date fields will be blank) Date Medicare IM given:  04/16/2014 Medicare IM given by:  MAYO,HENRIETTA Date Additional Medicare IM given:  04/19/2014 Additional Medicare IM given by:  [facility]  Discharge Disposition:    Per UR Regulation:  Reviewed for med. necessity/level of care/duration of stay  If discussed at [place] of Stay Meetings, dates discussed:    Comments:

## 2014-04-19 NOTE — Procedures (Signed)
Dialysis today (Monday for holiday TTS schedule) No heparin BFR 400 R thigh AVG cannulated no issues Goal 4 kg if weights are believable (looks like a 4 kg goal overnight) 2K2.5 Ca bath Labs are still pending at this time. Pt comfortable  Jamal Maes, MD [facility] [phone] Pager 04/19/2014, 7:56 AM

## 2014-04-19 NOTE — Progress Notes (Signed)
[facility]  [name]:[id number] DOB: 1970-12-10 DOA: 04/13/2014 PCP: [name], MD  Brief narrative: 43 yo BF PMHx Type 1 diabetes, failed kidney/pancreas transplant, chronic kidney disease on dialysis. Patient has a known history of repeated hospitalizations for complications from nonadherence to medical therapies prescribed. She reports missing dialysis on the Saturday before admission because her daughter was in an automobile accident. By the day of admission she had developed shortness of breath with cough. She presented to the ER where chest x-ray revealed edema with possible pneumonia. Her O2 sats were 70 percent on room air. She was also in DKA, with a glucose of 700 and an anion gap of 30. She was started on an insulin infusion while in the emergency department. She underwent dialysis on the date of admission with a total of 3980 mL removed with ultrafiltration. Her insulin drip has been discontinued in the setting of hypoglycemia, She did have persistent hypoglycemia with CBGs in the 40-50 range on dextrose 10 until around 8:00 am on 11/18 when CBG did increase to 93 and after diet was introduced CBGs increased at 346. Her anion gap was elevated at 17 but this is not unusual for her in the setting of her chronic kidney disease. By the evening of 11/19 she was transitioned to Lantus and thus far has remained stable. A shuntogram of her HD access has been scheduled for 11/20 with HD planned again for 11/21. On 11/21, transferred to Floor from [facility] service and started complaining of R arm/leg weakness and numbness which was reportedly going on for 4days, but not documented in any MD notes of prior providers. MRI 11/22: with L pons infarct and Neuro consulted  HPI/Subjective: Reports numbness and weakness a little better   Assessment/Plan:    Acute respiratory failure with hypoxia due to volume overload -resolved -> 3900 cc removed via  UF 11/17 with 2100 cc removed 11/19 -no clinical evidence to suggest PNA  -weaned off oxygen    ESRD (end stage renal disease) -per Nephro  -cont HD -shuntogram completed per IR 11/20   L pons infarct/CVA -with R arm numbness and weakness, improving -reportedly for 4days but not documented on previous provider notes -on Neuro exam, mild weakness and sensory deficits on R and diminished grip  -MRI was completed 11/22 which showed CVA, Neuro following -Now on ASA 325mg , Fu ECHo/carotid duplex -Fu hbaic, LDL 74 -PT eval completed, Ot/St eval pending    DKA (diabetic ketoacidoses) and hypoglycemia -onset after admit and required D10 -11/18  CBG was > 300 on D10 so dc'd D10 and was transitioned to Lantus but at 1/2 home dose since she is usually very brittle -overnight into 11/19 went back into DKA (glucose 765, AG 26) so back on insulin drip  -pm of 11/19 transitioned back to Lantus, yesterday with episode of hypoglycemia, now resolved, monitor -has a well established hx of  brittle DM.    GERD/Gastroparesis -cont PPI    RLS (restless legs syndrome)      Non compliance  DVT prophylaxis: subcutaneous heparin Code Status: full Family Communication: no family at bedside Disposition Plan/Expected LOS: home pending CVA workup Consultants: Nephrology/ Dr Jonnie Finner  Procedures: none  Antibiotics: Zosyn 11/17 > 11/18 Vancomycin 11/17 > 11/18  Objective: Blood pressure 132/62, pulse 86, temperature 98 F (36.7 C), temperature source Oral, resp. rate 13, height 5\' 1"  (1.549 m), weight 72.2 kg (159 lb 2.8 oz), last menstrual period 03/26/2014,  SpO2 100 %.  Intake/Output Summary (Last 24 hours) at 04/19/14 1108 Last data filed at 04/19/14 1101  Gross per 24 hour  Intake    240 ml  Output   2146 ml  Net  -1906 ml    Exam: Gen: No acute respiratory distress, AAOx3 Chest: Clear to auscultation bilaterally, 2L-weaning Cardiac: Regular rate and rhythm, S1-S2, no peripheral  edema, no JVD Abdomen: Soft nontender nondistended without obvious hepatosplenomegaly, no ascites Extremities: Symmetrical in appearance without cyanosis, clubbing or effusion Neuro: RUE 3-4/5, RLE 3-4/5, unable to differentiate from poor effort, DTR 1plus, plantars mute  Scheduled Meds:  Scheduled Meds: . aspirin  325 mg Oral Daily  . calcium acetate  2,001 mg Oral TID WC  . darbepoetin (ARANESP) injection - DIALYSIS  60 mcg Intravenous Q Thu-HD  . doxercalciferol      . doxercalciferol  2 mcg Intravenous Q T,Th,Sa-HD  . ferric gluconate (FERRLECIT/NULECIT) IV  62.5 mg Intravenous Q Thu-HD  . gabapentin  100 mg Oral Daily  . heparin  5,000 Units Subcutaneous 3 times per day  . insulin aspart  0-5 Units Subcutaneous QHS  . insulin aspart  0-9 Units Subcutaneous TID WC  . insulin glargine  7 Units Subcutaneous BID WC  . loratadine  10 mg Oral Daily  . montelukast  10 mg Oral QHS  . multivitamin  1 tablet Oral Daily  . pantoprazole  40 mg Oral QHS  . sodium chloride  10-40 mL Intracatheter Q12H  . sodium chloride  3 mL Intravenous Q12H    Data Reviewed: Basic Metabolic Panel:  Recent Labs Lab 04/15/14 0405 04/15/14 1520  04/16/14 0300 04/16/14 0620 04/17/14 0535 04/18/14 0446 04/19/14 0725  NA 127* 136*  < > 137 136* 135* 136* 134*  K 5.3 3.6*  < > 3.7 3.7 4.3 3.9 4.4  CL 85* 94*  < > 96 96 92* 95* 94*  CO2 16* 27  < > 28 26 27 26 27   GLUCOSE 765* 128*  < > 224* 231* 266* 133* 162*  BUN 48* 12  < > 16 16 25* 19 35*  CREATININE 8.70* 3.78*  < > 5.37* 5.72* 9.35* 6.55* 9.41*  CALCIUM 7.9* 9.0  < > 8.9 8.6 9.1 9.2 9.5  MG  --  1.9  --   --   --   --   --   --   PHOS 3.3  --   --   --   --   --   --  2.8  < > = values in this interval not displayed. Liver Function Tests:  Recent Labs Lab 04/13/14 0752 04/15/14 0405 04/19/14 0725  AST 13  --   --   ALT 8  --   --   ALKPHOS 115  --   --   BILITOT 0.7  --   --   PROT 7.9  --   --   ALBUMIN 3.5 2.5* 2.8*    CBC:  Recent Labs Lab 04/13/14 0752  04/13/14 1631 04/14/14 0208 04/15/14 0405 04/18/14 0446 04/19/14 0725  WBC 9.4  --  9.8 7.4 5.5 4.5 6.0  NEUTROABS 8.3*  --   --   --   --   --   --   HGB 10.3*  < > 9.9* 9.0* 8.5* 9.1* 9.3*  HCT 32.7*  < > 30.8* 27.6* 27.1* 28.8* 29.3*  MCV 100.3*  --  96.6 94.8 98.2 96.6 97.7  PLT 169  --  153 195  158 167 188  < > = values in this interval not displayed. CBG:  Recent Labs Lab 04/17/14 2245 04/18/14 0713 04/18/14 1148 04/18/14 1657 04/18/14 2054  GLUCAP 299* 127* 189* 111* 145*    Recent Results (from the past 240 hour(s))  Blood culture (routine x 2)     Status: None (Preliminary result)   Collection Time: 04/13/14  8:25 AM  Result Value Ref Range Status   Specimen Description BLOOD RIGHT HAND  Final   Special Requests BOTTLES DRAWN AEROBIC ONLY 2MLS  Final   Culture  Setup Time   Final    04/13/2014 14:04 Performed at [facility]    Culture   Final           BLOOD CULTURE RECEIVED NO GROWTH TO DATE CULTURE WILL BE HELD FOR 5 DAYS BEFORE ISSUING A FINAL NEGATIVE REPORT Performed at [facility]    Report Status PENDING  Incomplete  Blood culture (routine x 2)     Status: None (Preliminary result)   Collection Time: 04/13/14  8:35 AM  Result Value Ref Range Status   Specimen Description BLOOD RIGHT NECK  Final   Special Requests   Final    BOTTLES DRAWN AEROBIC AND ANAEROBIC 5MLS RIGHT EJ LINES    Culture  Setup Time   Final    04/13/2014 14:04 Performed at [facility]    Culture   Final           BLOOD CULTURE RECEIVED NO GROWTH TO DATE CULTURE WILL BE HELD FOR 5 DAYS BEFORE ISSUING A FINAL NEGATIVE REPORT Performed at [facility]    Report Status PENDING  Incomplete  MRSA PCR Screening     Status: None   Collection Time: 04/13/14  3:32 PM  Result Value Ref Range Status   MRSA by PCR NEGATIVE NEGATIVE Final    Comment:        The GeneXpert MRSA Assay (FDA approved for NASAL  specimens only), is one component of a comprehensive MRSA colonization surveillance program. It is not intended to diagnose MRSA infection nor to guide or monitor treatment for MRSA infections.   Culture, routine-abscess     Status: None   Collection Time: 04/14/14  3:03 PM  Result Value Ref Range Status   Specimen Description ABSCESS LYMPH NODE  Final   Special Requests NONE  Final   Gram Stain   Final    RARE WBC PRESENT,BOTH PMN AND MONONUCLEAR NO SQUAMOUS EPITHELIAL CELLS SEEN NO ORGANISMS SEEN Performed at [facility]    Culture   Final    NO GROWTH 3 DAYS Performed at [facility]    Report Status 04/17/2014 FINAL  Final      If 7PM-7AM, please contact night-coverage [email or web address] Password [facility] 04/19/2014, 11:08 AM   LOS: 6 days   Domenic Polite, MD  [phone]

## 2014-04-19 NOTE — Plan of Care (Signed)
Problem: Consults Goal: Skin Care Protocol Initiated - if Braden Score 18 or less If consults are not indicated, leave blank or document N/A  Outcome: Not Applicable Date Met:  28/20/81  Problem: Phase I Progression Outcomes Goal: Initial discharge plan identified Outcome: Completed/Met Date Met:  04/19/14  Problem: Phase II Progression Outcomes Goal: CBGs stable on SQ insulin Outcome: Completed/Met Date Met:  04/19/14 Goal: Acidosis resolved (CO2 > 20, ketones negative) Outcome: Completed/Met Date Met:  04/19/14 Goal: Potassium level normalizing Outcome: Completed/Met Date Met:  04/19/14 Goal: Progress activity as tolerated unless otherwise ordered Outcome: Completed/Met Date Met:  04/19/14

## 2014-04-19 NOTE — Progress Notes (Signed)
Patient passed swallow eval.

## 2014-04-19 NOTE — Progress Notes (Addendum)
STROKE TEAM PROGRESS NOTE   HISTORY [name] is a 43 y.o. female with a history of end-stage renal disease, hyperlipidemia, migraine headaches, previous kidney and pancreas transplants, hypertension, diabetes mellitus, and bronchitis who was admitted to [facility] Tuesday 04/13/2014 with shortness of breath and a productive cough.   The patient reported yesterday that on that Tuesday she also developed a migraine headache associated with right-sided numbness and mild right hemiparesis. She has also noted some intermittent slurred speech. An MRI was performed today that was consistent with an acute 6 mm lacunar infarct in the left dorsal pons. A neurology consult was requested. The patient had been on aspirin 81 mg daily prior to admission. She is now on aspirin 325 mg daily. She still complaining of a migraine headache which she rates as an 8 on a 1-10 scale. Benadryl and Compazine have been ordered.  Time last known well: Unable to determine The patient reports that the symptoms started on Tuesday 1117  tPA Given: No TPA - patient is out of the window for treatment.   SUBJECTIVE (INTERVAL HISTORY) She is currently in HD. Her RN is at the bedside.  Overall she feels her condition is stable.    OBJECTIVE Temp:  [97.9 F (36.6 C)-98.7 F (37.1 C)] 98.5 F (36.9 C) (11/23 0710) Pulse Rate:  [78-94] 84 (11/23 0930) Cardiac Rhythm:  [-]  Resp:  [12-18] 15 (11/23 0930) BP: (82-142)/(49-73) 82/52 mmHg (11/23 0930) SpO2:  [97 %-100 %] 100 % (11/23 0710) Weight:  [68.493 kg (151 lb)-72.2 kg (159 lb 2.8 oz)] 72.2 kg (159 lb 2.8 oz) (11/23 0710)   Recent Labs Lab 04/17/14 2245 04/18/14 0713 04/18/14 1148 04/18/14 1657 04/18/14 2054  GLUCAP 299* 127* 189* 111* 145*    Recent Labs Lab 04/15/14 0405 04/15/14 1520  04/16/14 0300 04/16/14 0620 04/17/14 0535 04/18/14 0446 04/19/14 0725  NA 127* 136*  < > 137 136* 135* 136* 134*  K 5.3 3.6*  < > 3.7 3.7 4.3 3.9 4.4   CL 85* 94*  < > 96 96 92* 95* 94*  CO2 16* 27  < > 28 26 27 26 27   GLUCOSE 765* 128*  < > 224* 231* 266* 133* 162*  BUN 48* 12  < > 16 16 25* 19 35*  CREATININE 8.70* 3.78*  < > 5.37* 5.72* 9.35* 6.55* 9.41*  CALCIUM 7.9* 9.0  < > 8.9 8.6 9.1 9.2 9.5  MG  --  1.9  --   --   --   --   --   --   PHOS 3.3  --   --   --   --   --   --  2.8  < > = values in this interval not displayed.  Recent Labs Lab 04/13/14 0752 04/15/14 0405 04/19/14 0725  AST 13  --   --   ALT 8  --   --   ALKPHOS 115  --   --   BILITOT 0.7  --   --   PROT 7.9  --   --   ALBUMIN 3.5 2.5* 2.8*    Recent Labs Lab 04/13/14 0752  04/13/14 1631 04/14/14 0208 04/15/14 0405 04/18/14 0446 04/19/14 0725  WBC 9.4  --  9.8 7.4 5.5 4.5 6.0  NEUTROABS 8.3*  --   --   --   --   --   --   HGB 10.3*  < > 9.9* 9.0* 8.5* 9.1* 9.3*  HCT 32.7*  < >  30.8* 27.6* 27.1* 28.8* 29.3*  MCV 100.3*  --  96.6 94.8 98.2 96.6 97.7  PLT 169  --  153 195 158 167 188  < > = values in this interval not displayed. No results for input(s): CKTOTAL, CKMB, CKMBINDEX, TROPONINI in the last 168 hours. No results for input(s): LABPROT, INR in the last 72 hours. No results for input(s): COLORURINE, LABSPEC, [place], GLUCOSEU, HGBUR, BILIRUBINUR, KETONESUR, PROTEINUR, UROBILINOGEN, NITRITE, LEUKOCYTESUR in the last 72 hours.  Invalid input(s): APPERANCEUR     Component Value Date/Time   CHOL 166 04/15/2014 1520   TRIG 98 04/15/2014 1520   HDL 72 04/15/2014 1520   CHOLHDL 2.3 04/15/2014 1520   VLDL 20 04/15/2014 1520   LDLCALC 74 04/15/2014 1520   Lab Results  Component Value Date   HGBA1C 11.4* 11/27/2013   No results found for: LABOPIA, COCAINSCRNUR, LABBENZ, AMPHETMU, THCU, LABBARB  No results for input(s): ETH in the last 168 hours.  Mr [name] Wo Contrast  04/18/2014   CLINICAL DATA:  43 year old female with acute numbness and tingling in the right side extremity for 5 days. Initial encounter.  EXAM: MRI HEAD WITHOUT CONTRAST   MRA HEAD WITHOUT CONTRAST  TECHNIQUE: Multiplanar, multiecho pulse sequences of the brain and surrounding structures were obtained without intravenous contrast. Angiographic images of the head were obtained using MRA technique without contrast.  COMPARISON:  Non contrast head CTs 06/27/2013 and earlier.  FINDINGS: MRI HEAD FINDINGS  6 mm focus of restricted diffusion in the dorsal pons on the left (series 4, image 9). Associated mild T2 and FLAIR hyperintensity. No associated mass effect or hemorrhage.  No other restricted diffusion. Major intracranial vascular flow voids are preserved.  No midline shift, mass effect, evidence of mass lesion, ventriculomegaly, extra-axial collection or acute intracranial hemorrhage. Cervicomedullary junction and pituitary are within normal limits. Chronic lacunar been *SCRATCH* sec chronic lacunar infarct in the left globus pallidus. Mild nonspecific mostly periventricular cerebral white matter T2 and FLAIR hyperintensity possible chronic micro hemorrhages in the anterior left frontal lobe (series 8, image 15). No cortical encephalomalacia. Cerebellum within normal limits. Visible internal auditory structures appear normal.  Trace left mastoid effusion. Negative visualized nasopharynx. Other Visualized paranasal sinuses and mastoids are clear. Visualized orbit soft tissues are within normal limits. Visualized scalp soft tissues are within normal limits.  Nonspecific decreased T1 bone marrow signal throughout.  MRA HEAD FINDINGS  Antegrade flow in the posterior circulation. Codominant distal vertebral arteries. Dominant, normal AICA origins. No basilar stenosis. SCA and PCA origins are normal. Normal posterior communicating arteries. Bilateral PCA branches are normal.  Antegrade flow in both ICA siphons. Tortuous distal cervical ICAs, more so the right 8. No siphon stenosis. Ophthalmic and posterior communicating artery origins are within normal limits. Normal carotid termini, MCA  and ACA origins.  Anterior communicating artery and visualized bilateral ACA and MCA branches are within normal limits.  IMPRESSION: 1. Acute 6 mm lacunar infarct in the left dorsal pons. No mass effect or hemorrhage. 2. Other evidence of chronic small vessel disease including left globus pallidus chronic lacune, possible chronic micro hemorrhages left frontal lobe. 3. Negative intracranial MRA, but tortuous cervical ICAs raising the possibility of chronic hypertension.   Electronically Signed   By: Lars Pinks M.D.   On: 04/18/2014 11:27   Mr [name] Contrast  04/18/2014   CLINICAL DATA:  43 year old female with acute numbness and tingling in the right side extremity for 5 days. Initial encounter.  EXAM: MRI HEAD  WITHOUT CONTRAST  MRA HEAD WITHOUT CONTRAST  TECHNIQUE: Multiplanar, multiecho pulse sequences of the brain and surrounding structures were obtained without intravenous contrast. Angiographic images of the head were obtained using MRA technique without contrast.  COMPARISON:  Non contrast head CTs 06/27/2013 and earlier.  FINDINGS: MRI HEAD FINDINGS  6 mm focus of restricted diffusion in the dorsal pons on the left (series 4, image 9). Associated mild T2 and FLAIR hyperintensity. No associated mass effect or hemorrhage.  No other restricted diffusion. Major intracranial vascular flow voids are preserved.  No midline shift, mass effect, evidence of mass lesion, ventriculomegaly, extra-axial collection or acute intracranial hemorrhage. Cervicomedullary junction and pituitary are within normal limits. Chronic lacunar been *SCRATCH* sec chronic lacunar infarct in the left globus pallidus. Mild nonspecific mostly periventricular cerebral white matter T2 and FLAIR hyperintensity possible chronic micro hemorrhages in the anterior left frontal lobe (series 8, image 15). No cortical encephalomalacia. Cerebellum within normal limits. Visible internal auditory structures appear normal.  Trace left mastoid  effusion. Negative visualized nasopharynx. Other Visualized paranasal sinuses and mastoids are clear. Visualized orbit soft tissues are within normal limits. Visualized scalp soft tissues are within normal limits.  Nonspecific decreased T1 bone marrow signal throughout.  MRA HEAD FINDINGS  Antegrade flow in the posterior circulation. Codominant distal vertebral arteries. Dominant, normal AICA origins. No basilar stenosis. SCA and PCA origins are normal. Normal posterior communicating arteries. Bilateral PCA branches are normal.  Antegrade flow in both ICA siphons. Tortuous distal cervical ICAs, more so the right 8. No siphon stenosis. Ophthalmic and posterior communicating artery origins are within normal limits. Normal carotid termini, MCA and ACA origins.  Anterior communicating artery and visualized bilateral ACA and MCA branches are within normal limits.  IMPRESSION: 1. Acute 6 mm lacunar infarct in the left dorsal pons. No mass effect or hemorrhage. 2. Other evidence of chronic small vessel disease including left globus pallidus chronic lacune, possible chronic micro hemorrhages left frontal lobe. 3. Negative intracranial MRA, but tortuous cervical ICAs raising the possibility of chronic hypertension.   Electronically Signed   By: Lars Pinks M.D.   On: 04/18/2014 11:27     PHYSICAL EXAM Frail middle aged african american lady not in distress. Patient getting dialysis   Afebrile. Head is nontraumatic. Neck is supple without bruit. Hearing is normal. Cardiac exam no murmur or gallop. Lungs are clear to auscultation. Distal pulses are well felt. Neurological Exam : awake alert oriented 3. Slightly diminished attention and recall. Speech appears normal without aphasia or dysarthria. Extraocular movements are full range without nystagmus. Blinks to threat bilaterally. Fundi were not visualized. Vision acuity seems adequate. Face is symmetric without weakness. Tongue is midline. Motor system exam reveals mild  weakness of the right intrinsic hand muscles and proximal shoulder strength limited due to pain. Equal strength and both lower extremities without focal weakness. Diminished touch pinprick sensation in both feet distally due to neuropathy. Coordination is slow but accurate. Gait was not tested. ASSESSMENT/PLAN [name] is a 43 y.o. female with history of end-stage renal disease, hyperlipidemia, migraine headaches, previous kidney and pancreas transplants, hypertension, diabetes mellitus, and bronchitis admitted for SOB and productive cough. In hospital she remembered also having migraine HA, R sided numbness and R sided hemiparesis on Tues as well. Stroke not recognized on admission. Imaging in hospital shows acute stroke. She did not receive IV t-PA due to delay in recognizing symptoms.   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  Resultant  Right sided numbness and possible RLE hemiparesis  MRI  L dorsal pontine infarct, small vessel disease, chronic microhemorrhage L frontal lobe  MRA  Tortuous ICAs, o/w Unremarkable   Carotid Doppler  pending   2D Echo  pending   Heparin 5000 units sq tid for VTE prophylaxis  Diet renal/carb modified with 1200 ml fluid restriction thin liquids  aspirin 81 mg orally every day prior to admission, now on aspirin 325 mg orally every day  Patient counseled to be compliant with her antithrombotic medications  Ongoing aggressive risk factor management  Therapy recommendations:  Possible HH PT, depending on progress  Disposition:  Anticipate return home  Hypertension  Stable  chronic microhemorrhage L frontal lobe on MRI supports history on uncontrolled hypertension   Torturous ICAs on  MRA support hx of chronic hypertension  Patient counseled to be compliant with her blood pressure medications  Hyperlipidemia  Home meds:  none  LDL 74, goal < 70  Recommend Addition of  statin  Diabetes, type I  S/p pancreatic transplant  HgbA1c 11.4 in July, not at goal < 7.0  Uncontrolled  Diabetic retinopathy  Other Stroke Risk Factors  Obesity, Body mass index is 30.09 kg/(m^2).   Migraines  Other Active Problems  Acute respiratory failure w/ hypoxia d/t vol overload, s/p flud removal   ESRD s/p failed kidney transplant in 2004, back on HD 2008  - T, TH, S,  GERD  Other Pertinent History   RLS  Depression  Hospital day # 6  [facility] BIBY, MSN, APRN, ANVP-BC, AGPCNP-BC [facility] Pager: [phone] 04/19/2014 11:37 AM   I have personally examined this patient, reviewed notes, independently viewed imaging studies, participated in medical decision making and plan of care. I have made any additions or clarifications directly to the above note. Agree with note above. I think she has a small pontine infarct secondary to small vessel disease. Will continue stroke workup  Antony Contras, MD Medical Director [place] Pager: [phone] 04/19/2014 11:18 AM     To contact Stroke Continuity provider, please refer to [email or web address] After hours, contact General Neurology

## 2014-04-19 NOTE — Progress Notes (Signed)
OT Cancellation Note  Patient Details Name: [name] MRN: [id number] DOB: May 22, 1971   Cancelled Treatment:    Reason Eval/Treat Not Completed: Patient at procedure or test/ unavailable (Pt in HD and now leaving for a test. Will continue to follow)  [name] 04/19/2014, 2:23 PM  [phone]

## 2014-04-19 NOTE — Evaluation (Signed)
Occupational Therapy Evaluation Patient Details Name: [name] MRN: [id number] DOB: [birth year] Today's Date: 04/19/2014    History of Present Illness Pt admitted with SOB, cough and hypoxia due to PNA with hyperglycemia.  PMH:  ESRD, DM.   Clinical Impression   Pt was independent in self care and IADL prior to admission.  She presents with R side weakness and impaired sensation interfering with ability to perform ADL.  Issued foam build up and instructed in use for pen, eating utensil and toothbrush.  Instructed and performed theraband and theraputty exercises with R UE.  Pt is highly motivated to return to independence.  Educated in signs and symptoms of stroke. Will follow acutely.    Follow Up Recommendations  Home health OT    Equipment Recommendations  None recommended by OT    Recommendations for Other Services       Precautions / Restrictions Precautions Precautions: Fall Restrictions Weight Bearing Restrictions: No      Mobility Bed Mobility Overal bed mobility: Independent                Transfers Overall transfer level: Needs assistance   Transfers: Sit to/from Stand Sit to Stand: Supervision              Balance                                            ADL Overall ADL's : Needs assistance/impaired Eating/Feeding: Modified independent;Sitting   Grooming: Wash/dry hands;Wash/dry face;Supervision/safety;Standing   Upper Body Bathing: Set up;Sitting   Lower Body Bathing: Supervison/ safety;Sit to/from stand   Upper Body Dressing : Set up;Sitting   Lower Body Dressing: Supervision/safety;Sit to/from stand   Toilet Transfer: Supervision/safety;Ambulation   Toileting- Clothing Manipulation and Hygiene: Supervision/safety;Sit to/from stand         General ADL Comments: Uses R UE as her lead when performing ADL. Performs ADL slowly, but did not require physical assist. Ambulated to sink and bathroom using  furniture for stability.     Vision                     Perception     Praxis      Pertinent Vitals/Pain Pain Assessment: No/denies pain     Hand Dominance Right   Extremity/Trunk Assessment Upper Extremity Assessment Upper Extremity Assessment: RUE deficits/detail RUE Deficits / Details: 4/5 strength RUE Sensation: decreased proprioception (reports her hand feels asleep, requires visual monitoring) RUE Coordination: decreased fine motor   Lower Extremity Assessment Lower Extremity Assessment: Defer to PT evaluation   Cervical / Trunk Assessment Cervical / Trunk Assessment: Normal   Communication Communication Communication: No difficulties   Cognition Arousal/Alertness: Awake/alert Behavior During Therapy: WFL for tasks assessed/performed Overall Cognitive Status: Within Functional Limits for tasks assessed                     General Comments       Exercises Exercises: General Upper Extremity;Hand exercises     Shoulder Instructions      Home Living Family/patient expects to be discharged to:: Private residence Living Arrangements: Alone Available Help at Discharge: Friend(s);Available PRN/intermittently Type of Home: Apartment (studio apartment) Home Access: Level entry     Home Layout: One level     Bathroom Shower/Tub: Occupational psychologist: Standard     Home Equipment:  None   Additional Comments: can stay with a friend short term while boyfriend is working out of town      Prior Functioning/Environment Level of Independence: Independent        Comments: pt does not drive    OT Diagnosis: Generalized weakness;Hemiplegia dominant side   OT Problem List: Decreased strength;Decreased activity tolerance;Impaired balance (sitting and/or standing);Decreased coordination;Impaired sensation;Impaired UE functional use   OT Treatment/Interventions: Self-care/ADL training;Neuromuscular education;DME and/or AE  instruction;Therapeutic activities;Patient/family education    OT Goals(Current goals can be found in the care plan section) Acute Rehab OT Goals Patient Stated Goal: regain R side coordination OT Goal Formulation: With patient Time For Goal Achievement: 05/03/14 Potential to Achieve Goals: Good ADL Goals Pt Will Perform Eating: with modified independence;sitting Pt Will Perform Grooming: with modified independence;standing Pt Will Perform Lower Body Bathing: with modified independence;sit to/from stand Pt Will Perform Lower Body Dressing: with modified independence;sit to/from stand Pt Will Transfer to Toilet: with modified independence;ambulating;regular height toilet Pt Will Perform Toileting - Clothing Manipulation and hygiene: with modified independence;sit to/from stand Pt Will Perform Tub/Shower Transfer: Shower transfer;with modified independence;ambulating Pt/caregiver will Perform Home Exercise Program: Increased strength;Right Upper extremity;With theraband;With theraputty;Independently  OT Frequency: Min 3X/week   Barriers to D/C:            Co-evaluation              End of Session Equipment Utilized During Treatment: Gait belt;Other (comment) (theraband, theraputty, foam build up)  Activity Tolerance: Patient tolerated treatment well Patient left: in bed;with call bell/phone within reach   Time: 1459-1545 OT Time Calculation (min): 46 min Charges:  OT General Charges $OT Visit: 1 Procedure OT Evaluation $Initial OT Evaluation Tier I: 1 Procedure OT Treatments $Self Care/Home Management : 8-22 mins $Neuromuscular Re-education: 8-22 mins G-Codes:    [name] 04/19/2014, 4:07 PM  [phone]

## 2014-04-19 NOTE — Progress Notes (Addendum)
*  PRELIMINARY RESULTS* Vascular Ultrasound Carotid Duplex (Doppler) has been completed.  Preliminary findings: Bilateral:  1-39% ICA stenosis.  Left Vertebral artery flow is antegrade.   Limited views of right carotid due to IJ line.   Landry Mellow, RDMS, RVT  04/19/2014, 2:39 PM

## 2014-04-19 NOTE — Plan of Care (Signed)
Problem: Phase I Progression Outcomes Goal: Pain controlled with appropriate interventions Outcome: Completed/Met Date Met:  04/19/14     

## 2014-04-20 LAB — LIPID PANEL
Cholesterol: 172 mg/dL (ref 0–200)
HDL: 49 mg/dL (ref >39)
LDL CALC: 83 mg/dL (ref 0–99)
TRIGLYCERIDES: 200 mg/dL — AB (ref <150)
Total CHOL/HDL Ratio: 3.5 RATIO
VLDL: 40 mg/dL (ref 0–40)

## 2014-04-20 LAB — GLUCOSE, CAPILLARY
Glucose-Capillary: 95 mg/dL (ref 70–99)
Glucose-Capillary: 183 mg/dL — ABNORMAL HIGH (ref 70–99)
Glucose-Capillary: 90 mg/dL (ref 70–99)
Glucose-Capillary: 120 mg/dL — ABNORMAL HIGH (ref 70–99)

## 2014-04-20 MED ORDER — ASPIRIN 325 MG PO TABS: 325.0000 mg | ORAL_TABLET | Freq: Every day | ORAL | Status: DC

## 2014-04-20 MED ORDER — SIMVASTATIN 20 MG PO TABS: 20.0000 mg | ORAL_TABLET | Freq: Every day | ORAL | Status: DC

## 2014-04-20 MED ORDER — DARBEPOETIN ALFA 60 MCG/0.3ML IJ SOSY: 60.0000 ug | PREFILLED_SYRINGE | INTRAMUSCULAR | Status: DC

## 2014-04-20 MED ORDER — DARBEPOETIN ALFA 60 MCG/0.3ML IJ SOSY
60.0000 ug | PREFILLED_SYRINGE | Freq: Once | INTRAMUSCULAR | Status: AC
  Administered 2014-04-21: 60 ug via INTRAVENOUS
  Filled 2014-04-20: qty 0.3

## 2014-04-20 MED ORDER — SIMVASTATIN 20 MG PO TABS
20.0000 mg | ORAL_TABLET | Freq: Every day | ORAL | Status: DC
  Administered 2014-04-20 – 2014-04-22 (×2): 20 mg via ORAL
  Filled 2014-04-20 (×5): qty 1

## 2014-04-20 MED ORDER — PROCHLORPERAZINE EDISYLATE 5 MG/ML IJ SOLN
5.0000 mg | Freq: Once | INTRAMUSCULAR | Status: AC
  Administered 2014-04-20: 5 mg via INTRAVENOUS
  Filled 2014-04-20: qty 1

## 2014-04-20 MED ORDER — STROKE: EARLY STAGES OF RECOVERY BOOK
Freq: Once | Status: AC
  Administered 2014-04-20: 11:00:00
  Filled 2014-04-20: qty 1

## 2014-04-20 MED ORDER — INSULIN ASPART 100 UNIT/ML ~~LOC~~ SOLN: 0.0000 [IU] | Freq: Three times a day (TID) | SUBCUTANEOUS | Status: DC

## 2014-04-20 NOTE — Progress Notes (Signed)
[facility] Rounding Note Subjective:  Right leg weakness and numbness, ambulating with walker, but better control of right hand  Objective: Vital signs in last 24 hours: Temp:  [97.8 F (36.6 C)-98.7 F (37.1 C)] 98.2 F (36.8 C) (11/24 0416) Pulse Rate:  [82-101] 90 (11/24 0416) Resp:  [13-18] 17 (11/24 0416) BP: (82-151)/(46-68) 111/52 mmHg (11/24 0416) SpO2:  [90 %-100 %] 100 % (11/24 0416) Weight:  [68.8 kg (151 lb 10.8 oz)] 68.8 kg (151 lb 10.8 oz) (11/23 1101) Weight change: 3.707 kg (8 lb 2.8 oz)  Intake/Output from previous day: 11/23 0701 - 11/24 0700 In: 480 [P.O.:480] Out: 2146  Intake/Output this shift:   EXAM: General appearance:  Alert, in no apparent distress Resp:  CTA without rales, rhonchi, or wheezes Cardio:  RRR with Gr II/VI systolic murmur, no rub GI:  + BS, soft and nontender Extremities:  No edema, R upper & lower extremity weakness Access:  R thigh AVG with + bruit  Lab Results:  Recent Labs  04/18/14 0446 04/19/14 0725  WBC 4.5 6.0  HGB 9.1* 9.3*  HCT 28.8* 29.3*  PLT 167 188   BMET:  Recent Labs  04/18/14 0446 04/19/14 0725  NA 136* 134*  K 3.9 4.4  CL 95* 94*  CO2 26 27  GLUCOSE 133* 162*  BUN 19 35*  CREATININE 6.55* 9.41*  CALCIUM 9.2 9.5  ALBUMIN  --  2.8*    Scheduled Medications .  stroke: mapping our early stages of recovery book   Does not apply Once  . aspirin  325 mg Oral Daily  . calcium acetate  2,001 mg Oral TID WC  . [START ON 04/21/2014] darbepoetin (ARANESP) injection - DIALYSIS  60 mcg Intravenous Once  . [START ON 04/29/2014] darbepoetin (ARANESP) injection - DIALYSIS  60 mcg Intravenous Q Thu-HD  . doxercalciferol  2 mcg Intravenous Q T,Th,Sa-HD  . ferric gluconate (FERRLECIT/NULECIT) IV  62.5 mg Intravenous Q Thu-HD  . gabapentin  100 mg Oral Daily  . heparin  5,000 Units Subcutaneous 3 times per day  . insulin aspart  0-5 Units Subcutaneous QHS  . insulin aspart  0-9 Units Subcutaneous  TID WC  . insulin glargine  7 Units Subcutaneous BID WC  . loratadine  10 mg Oral Daily  . montelukast  10 mg Oral QHS  . multivitamin  1 tablet Oral Daily  . pantoprazole  40 mg Oral QHS  . prochlorperazine  5 mg Intravenous Once  . simvastatin  20 mg Oral QHS  . sodium chloride  10-40 mL Intracatheter Q12H  . sodium chloride  3 mL Intravenous Q12H   HD: [facility] F160 3h 72min, 68.5kh 2/2.5 Bath R thigh AVG (PTA this adm 11/20) Heparin none Hect 2 ug TIW, Aranesp 25 mcg weekly Increased to 60 this adm Venofer 50/wk maint   Assessment/Plan: 1. R side weakness / numbness - sec to acute L dorsal pons infarct per MRI 11/22; too late for TPA per Neuro, improving slowly. 2. Pulmonary edema - resolved. 3. ESRD - HD on TTS @ [facility], K 4.4.  HD tomorrow per holiday schedule. 4. HTN/Volume - BP 111/52, no meds; wt 68.8 kg s/p net UF 2.1 L yesterday. 5. Anemia - Hgb 9.3, Aranesp increased to 60 mcg on Wed (per holiday schedule), weekly Fe.. 6. Sec HPT - Ca 9.5 (10.5 corrected), P 2.8; Hectorol 2 mcg, Phoslo 3 with meals.  Use 2Ca bath. 7. Nutrition - Alb 2.8, carb-mod renal diet, vitmain.  LOS: 7 days   LYLES,CHARLES 04/20/2014,8:42 AM   I have seen and examined this patient and agree with plan and assessment per the note of C Lyles PAC.  It appears per the note of CM that she may be discharged to home today. (Note she is currently lining in an Extended [facility] - does not officially HAVE a home right now). If she is discharged, will need transportation to HD tomorrow on the holiday schedule.  Lalitha Ilyas B,MD 04/20/2014 11:48 AM

## 2014-04-20 NOTE — Discharge Summary (Signed)
Physician Discharge Summary  [name] GNF:[id number] DOB: March 21, 1971 DOA: 04/13/2014  PCP: [name], MD  Admit date: 04/13/2014 Discharge date: 04/20/2014  Time spent: 45 minutes  Recommendations for Outpatient Follow-up:  1. PCP in 1 week 2. Dr.Sethi in 1 month  Discharge Diagnoses:    Volume overload   DKA   Brittle DM   Acute CVA   ESRD (end stage renal disease)   Hyperlipidemia   GERD (gastroesophageal reflux disease)   RLS (restless legs syndrome)   HCAP (healthcare-associated pneumonia)   DKA (diabetic ketoacidoses)   Acute respiratory failure with hypoxia   Hypoglycemia   Gastroparesis   Volume overload   End-stage renal disease on hemodialysis   HLD (hyperlipidemia)   Restless leg syndrome   Pain and swelling of right lower extremity   Poor venous access   Cerebral infarction due to thrombosis of basilar artery   Discharge Condition: stable  Diet recommendation: Renal/Diabetic  Filed Weights   04/18/14 2051 04/19/14 0710 04/19/14 1101  Weight: 68.493 kg (151 lb) 72.2 kg (159 lb 2.8 oz) 68.8 kg (151 lb 10.8 oz)    History of present illness:  [name] is a 43 y.o. female, hx of DM1, failed kidney /pancreas transplant, ESRD on HD TTS ,presenting with SOB to ED. Missed last HD on Sat because daughter had a bad car accident that day.developed SOB early this am, as well complaining of cough, productive pneumonia, denies fever chills or chest pain,. Chest x-ray showing edema, possible pneumonia on x-ray, she was hypoxic on room air, SaO2 70's on RA. Requiring oxygen,.reports fever but no chills, no abd pain, n/v/d. The patient was find to have hyperglycemia with DKA, glucose more than 700, started on insulin drip, as well she had hyperkalemia.  Hospital Course:  Acute respiratory failure with hypoxia due to volume overload -resolved -> 3900 cc removed via UF 11/17 with 2100 cc removed 11/19 -no clinical evidence to suggest PNA   -weaned off oxygen   ESRD (end stage renal disease) -per Nephro  -cont HD -shuntogram completed per IR 11/20  L pons infarct/CVA -with R arm numbness and weakness, improving -reportedly for 4days but not documented on previous provider notes -on Neuro exam, mild weakness and sensory deficits on R and diminished grip  -MRI was completed 11/22 which showed 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 -Now on ASA 325mg  and on low dose statin -2D ECHo with normal EF, no cardiac source of embolus, Carotid duplex with 1-39% stenosis -hbaic 11.4 in July'14, LDL 83 -PT/Ot eval completed, home health services set up   DKA (diabetic ketoacidoses) and hypoglycemia -onset after admit and required D10 -11/18 CBG was > 300 on D10 so dc'd D10 and was transitioned to Lantus but at 1/2 home dose since she is usually very brittle -overnight into 11/19 went back into DKA (glucose 765, AG 26) so back on insulin drip  -pm of 11/19 transitioned back to Lantus, then had hypoglycemia, now resolved, monitor -has a well established hx of brittle DM. -CBGs fluctuating but without hypoglycemia at this time   GERD/Gastroparesis -cont PPI   RLS (restless legs syndrome)    Non compliance  Consultations:  Renal  NEurology  Discharge Exam: Filed Vitals:   04/20/14 0941  BP: 138/67  Pulse: 88  Temp: 98.3 F (36.8 C)  Resp: 18    General: AAOx3 Cardiovascular: S1S2/RRR Respiratory: CTAB  Discharge Instructions You were cared for by a hospitalist during your hospital stay. If you  have any questions about your discharge medications or the care you received while you were in the hospital after you are discharged, you can call the unit and asked to speak with the hospitalist on call if the hospitalist that took care of you is not available. Once you are discharged, your primary care physician will handle any further medical issues. Please note that NO REFILLS for any discharge medications  will be authorized once you are discharged, as it is imperative that you return to your primary care physician (or establish a relationship with a primary care physician if you do not have one) for your aftercare needs so that they can reassess your need for medications and monitor your lab values.  Discharge Instructions    Discharge instructions    Complete by:  As directed   Renal Diabetic diet     Increase activity slowly    Complete by:  As directed           Current Discharge Medication List    START taking these medications   Details  aspirin 325 MG tablet Take 1 tablet (325 mg total) by mouth daily.    insulin aspart (NOVOLOG) 100 UNIT/ML injection Inject 0-9 Units into the skin 3 (three) times daily with meals. Qty: 10 mL, Refills: 0    simvastatin (ZOCOR) 20 MG tablet Take 1 tablet (20 mg total) by mouth at bedtime. Qty: 30 tablet, Refills: 0      CONTINUE these medications which have NOT CHANGED   Details  albuterol (PROVENTIL HFA;VENTOLIN HFA) 108 (90 BASE) MCG/ACT inhaler Inhale 2 puffs into the lungs every 4 (four) hours as needed for wheezing or shortness of breath (as per home regimen). Qty: 1 Inhaler, Refills: 0    calcium acetate (PHOSLO) 667 MG capsule Take 2,001 mg by mouth 3 (three) times daily with meals.    clonazePAM (KLONOPIN) 0.5 MG tablet Take 1 mg by mouth at bedtime.     gabapentin (NEURONTIN) 100 MG capsule Take 100 mg by mouth daily.    Insulin Glargine (LANTUS) 100 UNIT/ML Solostar Pen Inject 7 Units into the skin 2 (two) times daily with a meal. Qty: 15 mL, Refills: 11    lidocaine-prilocaine (EMLA) cream Apply 1 application topically 2 (two) times a week.     metoCLOPramide (REGLAN) 5 MG tablet Take 1 tablet (5 mg total) by mouth 4 (four) times daily -  before meals and at bedtime. Qty: 60 tablet, Refills: 0    montelukast (SINGULAIR) 10 MG tablet Take 10 mg by mouth at bedtime.    multivitamin (RENA-VIT) TABS tablet Take 1 tablet by  mouth daily.    ondansetron (ZOFRAN-ODT) 4 MG disintegrating tablet Take 1 tablet (4 mg total) by mouth every 6 (six) hours as needed for nausea or vomiting. Qty: 20 tablet, Refills: 0    pantoprazole (PROTONIX) 40 MG tablet Take 1 tablet (40 mg total) by mouth at bedtime. Qty: 30 tablet, Refills: 0    zolpidem (AMBIEN) 5 MG tablet Take 5 mg by mouth at bedtime.      STOP taking these medications     aspirin EC 81 MG tablet      insulin aspart (NOVOLOG FLEXPEN) 100 UNIT/ML FlexPen        Allergies  Allergen Reactions  . Depakote [Divalproex Sodium] Other (See Comments)    Pt gets "delirious"  . Morphine And Related Nausea And Vomiting and Other (See Comments)    "makes me delirious"  . Penicillins Anaphylaxis  Tolerated Zosyn Dec 2014  . Tramadol Nausea And Vomiting  . Imitrex [Sumatriptan] Other (See Comments)    seizures  . Vicodin [Hydrocodone-Acetaminophen] Itching and Rash   Follow-up Information    Follow up with [name], Gifford Shave, MD. Schedule an appointment as soon as possible for a visit in 1 week.   Specialty:  Family Medicine   Contact information:   [address] [place] [address] [phone]       Follow up with SETHI,PRAMOD, MD. Schedule an appointment as soon as possible for a visit in 1 month.   Specialties:  Neurology, Radiology   Why:  for Stroke FU   Contact information:   [address] [place] [address] [phone]        The results of significant diagnostics from this hospitalization (including imaging, microbiology, ancillary and laboratory) are listed below for reference.    Significant Diagnostic Studies: Dg Chest 2 View  04/14/2014   CLINICAL DATA:  Evaluate for possible pneumonia, shortness of breath, productive cuff  EXAM: CHEST  2 VIEW  COMPARISON:  04/13/2014  FINDINGS: Cardiomediastinal silhouette is stable. There is improvement in aeration without convincing pulmonary edema or  segmental infiltrate. Residual mild interstitial prominence bilateral upper lobes. Bony thorax is unremarkable.  IMPRESSION: Improvement in aeration without convincing pulmonary edema. No segmental infiltrate. Residual mild interstitial prominence bilateral upper lobes.   Electronically Signed   By: Lahoma Crocker M.D.   On: 04/14/2014 12:05   Mr [name] Contrast  04/18/2014   CLINICAL DATA:  43 year old female with acute numbness and tingling in the right side extremity for 5 days. Initial encounter.  EXAM: MRI HEAD WITHOUT CONTRAST  MRA HEAD WITHOUT CONTRAST  TECHNIQUE: Multiplanar, multiecho pulse sequences of the brain and surrounding structures were obtained without intravenous contrast. Angiographic images of the head were obtained using MRA technique without contrast.  COMPARISON:  Non contrast head CTs 06/27/2013 and earlier.  FINDINGS: MRI HEAD FINDINGS  6 mm focus of restricted diffusion in the dorsal pons on the left (series 4, image 9). Associated mild T2 and FLAIR hyperintensity. No associated mass effect or hemorrhage.  No other restricted diffusion. Major intracranial vascular flow voids are preserved.  No midline shift, mass effect, evidence of mass lesion, ventriculomegaly, extra-axial collection or acute intracranial hemorrhage. Cervicomedullary junction and pituitary are within normal limits. Chronic lacunar been *SCRATCH* sec chronic lacunar infarct in the left globus pallidus. Mild nonspecific mostly periventricular cerebral white matter T2 and FLAIR hyperintensity possible chronic micro hemorrhages in the anterior left frontal lobe (series 8, image 15). No cortical encephalomalacia. Cerebellum within normal limits. Visible internal auditory structures appear normal.  Trace left mastoid effusion. Negative visualized nasopharynx. Other Visualized paranasal sinuses and mastoids are clear. Visualized orbit soft tissues are within normal limits. Visualized scalp soft tissues are within normal  limits.  Nonspecific decreased T1 bone marrow signal throughout.  MRA HEAD FINDINGS  Antegrade flow in the posterior circulation. Codominant distal vertebral arteries. Dominant, normal AICA origins. No basilar stenosis. SCA and PCA origins are normal. Normal posterior communicating arteries. Bilateral PCA branches are normal.  Antegrade flow in both ICA siphons. Tortuous distal cervical ICAs, more so the right 8. No siphon stenosis. Ophthalmic and posterior communicating artery origins are within normal limits. Normal carotid termini, MCA and ACA origins.  Anterior communicating artery and visualized bilateral ACA and MCA branches are within normal limits.  IMPRESSION: 1. Acute 6 mm lacunar infarct in the left  dorsal pons. No mass effect or hemorrhage. 2. Other evidence of chronic small vessel disease including left globus pallidus chronic lacune, possible chronic micro hemorrhages left frontal lobe. 3. Negative intracranial MRA, but tortuous cervical ICAs raising the possibility of chronic hypertension.   Electronically Signed   By: Lars Pinks M.D.   On: 04/18/2014 11:27   Mr [name] Contrast  04/18/2014   CLINICAL DATA:  43 year old female with acute numbness and tingling in the right side extremity for 5 days. Initial encounter.  EXAM: MRI HEAD WITHOUT CONTRAST  MRA HEAD WITHOUT CONTRAST  TECHNIQUE: Multiplanar, multiecho pulse sequences of the brain and surrounding structures were obtained without intravenous contrast. Angiographic images of the head were obtained using MRA technique without contrast.  COMPARISON:  Non contrast head CTs 06/27/2013 and earlier.  FINDINGS: MRI HEAD FINDINGS  6 mm focus of restricted diffusion in the dorsal pons on the left (series 4, image 9). Associated mild T2 and FLAIR hyperintensity. No associated mass effect or hemorrhage.  No other restricted diffusion. Major intracranial vascular flow voids are preserved.  No midline shift, mass effect, evidence of mass lesion,  ventriculomegaly, extra-axial collection or acute intracranial hemorrhage. Cervicomedullary junction and pituitary are within normal limits. Chronic lacunar been *SCRATCH* sec chronic lacunar infarct in the left globus pallidus. Mild nonspecific mostly periventricular cerebral white matter T2 and FLAIR hyperintensity possible chronic micro hemorrhages in the anterior left frontal lobe (series 8, image 15). No cortical encephalomalacia. Cerebellum within normal limits. Visible internal auditory structures appear normal.  Trace left mastoid effusion. Negative visualized nasopharynx. Other Visualized paranasal sinuses and mastoids are clear. Visualized orbit soft tissues are within normal limits. Visualized scalp soft tissues are within normal limits.  Nonspecific decreased T1 bone marrow signal throughout.  MRA HEAD FINDINGS  Antegrade flow in the posterior circulation. Codominant distal vertebral arteries. Dominant, normal AICA origins. No basilar stenosis. SCA and PCA origins are normal. Normal posterior communicating arteries. Bilateral PCA branches are normal.  Antegrade flow in both ICA siphons. Tortuous distal cervical ICAs, more so the right 8. No siphon stenosis. Ophthalmic and posterior communicating artery origins are within normal limits. Normal carotid termini, MCA and ACA origins.  Anterior communicating artery and visualized bilateral ACA and MCA branches are within normal limits.  IMPRESSION: 1. Acute 6 mm lacunar infarct in the left dorsal pons. No mass effect or hemorrhage. 2. Other evidence of chronic small vessel disease including left globus pallidus chronic lacune, possible chronic micro hemorrhages left frontal lobe. 3. Negative intracranial MRA, but tortuous cervical ICAs raising the possibility of chronic hypertension.   Electronically Signed   By: Lars Pinks M.D.   On: 04/18/2014 11:27   Ir Veno/jugular Left  04/16/2014   CLINICAL DATA:  renal failure, needs  durable venous access.  EXAM:  RIGHT IJ CENTRAL VENOUS CATHETER PLACEMENT UNDER ULTRASOUND AND FLUOROSCOPIC GUIDANCE:  FLUOROSCOPY TIME:  6 min 12 seconds  TECHNIQUE: The procedure, risks (including but not limited to bleeding, infection, organ damage ), benefits, and alternatives were explained to the patient. Questions regarding the procedure were encouraged and answered. The patient understands and consents to the procedure. The patient had an indwelling ready EJ peripheral IV, so initially left-sided approach was selected. Left neck prepped with chlorhexidine, draped in usual sterile fashion. Maximal barrier sterile technique was utilized including caps, mask, sterile gowns, sterile gloves, sterile drape, hand hygiene and skin antiseptic. Overlying skin infiltrated with 1% lidocaine. Under real-time ultrasound guidance, the left IJ vein was accessed  with a 21 gauge micropuncture needle. The guidewire would not advance centrally easily. Small contrast venogram demonstrated occlusion of the central aspect of the left IJ vein. For this reason, a right-sided approach was used. Patency of the right IJ vein was confirmed with ultrasound with image documentation. An appropriate skin site was determined. Skin site was marked. Region was prepped using maximum barrier technique including cap and mask, sterile gown, sterile gloves, large sterile sheet, and Chlorhexidine as cutaneous antisepsis. The region was infiltrated locally with 1% lidocaine. Under real-time ultrasound guidance, the right IJ vein was accessed with a 21 gauge micropuncture needle; the needle tip within the vein was confirmed with ultrasound image documentation. The guidewire would not advance centrally easily. Contrast venogram demonstrated occlusion of the central aspect of the right IJ vein, with a prominent collateral channel to the SVC. With the aid of an angled Glidewire, this was catheterized. A double-lumen PICC line would not advance through the length of the tortuosity of  the course. For this reason, over the Glidewire a single lumen 5 [place] power injectable PICC catheter trimmed to 18 cm was advanced, positioned with the tip in the mid right atrium. Catheter was secured externally and flow flushed per protocol. The patient tolerated the procedure well.  COMPLICATIONS: None immediate  IMPRESSION: 1. Technically successful right IJ Single lumen power injectable central venous catheter placement.   Electronically Signed   By: Arne Cleveland M.D.   On: 04/14/2014 15:10   Ir Pta Venous Right  04/16/2014   CLINICAL DATA:  Pain overlying right thigh dialysis graft with right lower extremity edema. 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  EXAM: 1.  DIALYSIS AV SHUNTOGRAM  2.  ANGIOPLASTY OF RIGHT EXTERNAL AND COMMON ILIAC VEINS  COMPARISON:  None  CONTRAST:  50 mL Omnipaque 300  FLUOROSCOPY TIME:  1 min and 54 seconds  MEDICATIONS: 50 mcg IV fentanyl  PROCEDURE: The procedure, risks, benefits, and alternatives were explained to the patient. Questions regarding the procedure were encouraged and answered. The patient understands and consents to the procedure.  The right thigh dialysis graft was prepped with Betadine in a sterile fashion, and a sterile drape was applied covering the operative field. A diagnostic shunt study was performed via an 18 gauge angiocatheter introduced into venous outflow. Venous drainage was assessed to the level of the central veins in the chest. Proximal shunt was studied by reflux maneuver with temporary compression of venous outflow.  The angiocath was removed and replaced with a 7-French sheath over a guidewire. Balloon angioplasty of the right external iliac vein was then performed with a 10 mm x 4 cm Conquest balloon. Angiography was performed.  Additional balloon angioplasty was performed of the right common iliac vein with a 12 mm x 4 cm Atlas balloon.  Balloon angioplasty was followed by additional angiography. The sheath was  removed and hemostasis obtained with application of a 2-0 Ethilon pursestring suture.  COMPLICATIONS: None  FINDINGS: The in loop graft in the right thigh is normally patent including arterial and venous anastomoses. The graft itself shows small focal pseudoaneurysms related to prior puncture sites. Venous outflow demonstrates focal stenosis of the distal external iliac vein of approximately 80%. Longer segmental stenosis of the common iliac vein approaches 80% in estimated caliber. The inferior vena cava is widely patent.  After 10 mm balloon angioplasty, there was improvement in appearance of the external iliac vein stenosis with mild residual roughly 25% stenosis present. There remained moderate  degree of common iliac vein narrowing of approximately 60- 70%. After further 12 mm balloon angioplasty of the common iliac vein, there is further improvement in patency with only mild 20- 25% narrowing remaining. Excellent flow was present through the outflow veins on completion.  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 These were successfully treated with 10 mm and 12 mm balloon angioplasty with significant angiographic improvement, as above.  ACCESS: The graft and venous outflow remain amenable to percutaneous intervention.   Electronically Signed   By: Aletta Edouard M.D.   On: 04/16/2014 17:06   Ir Fluoro Guide Cv Line Right  04/14/2014   CLINICAL DATA:  renal failure, needs  durable venous access.  EXAM: RIGHT IJ CENTRAL VENOUS CATHETER PLACEMENT UNDER ULTRASOUND AND FLUOROSCOPIC GUIDANCE:  FLUOROSCOPY TIME:  6 min 12 seconds  TECHNIQUE: The procedure, risks (including but not limited to bleeding, infection, organ damage ), benefits, and alternatives were explained to the patient. Questions regarding the procedure were encouraged and answered. The patient understands and consents to the procedure. The patient had an indwelling ready EJ  peripheral IV, so initially left-sided approach was selected. Left neck prepped with chlorhexidine, draped in usual sterile fashion. Maximal barrier sterile technique was utilized including caps, mask, sterile gowns, sterile gloves, sterile drape, hand hygiene and skin antiseptic. Overlying skin infiltrated with 1% lidocaine. Under real-time ultrasound guidance, the left IJ vein was accessed with a 21 gauge micropuncture needle. The guidewire would not advance centrally easily. Small contrast venogram demonstrated occlusion of the central aspect of the left IJ vein. For this reason, a right-sided approach was used. Patency of the right IJ vein was confirmed with ultrasound with image documentation. An appropriate skin site was determined. Skin site was marked. Region was prepped using maximum barrier technique including cap and mask, sterile gown, sterile gloves, large sterile sheet, and Chlorhexidine as cutaneous antisepsis. The region was infiltrated locally with 1% lidocaine. Under real-time ultrasound guidance, the right IJ vein was accessed with a 21 gauge micropuncture needle; the needle tip within the vein was confirmed with ultrasound image documentation. The guidewire would not advance centrally easily. Contrast venogram demonstrated occlusion of the central aspect of the right IJ vein, with a prominent collateral channel to the SVC. With the aid of an angled Glidewire, this was catheterized. A double-lumen PICC line would not advance through the length of the tortuosity of the course. For this reason, over the Glidewire a single lumen 5 [place] power injectable PICC catheter trimmed to 18 cm was advanced, positioned with the tip in the mid right atrium. Catheter was secured externally and flow flushed per protocol. The patient tolerated the procedure well.  COMPLICATIONS: None immediate  IMPRESSION: 1. Technically successful right IJ Single lumen power injectable central venous catheter placement.    Electronically Signed   By: Arne Cleveland M.D.   On: 04/14/2014 15:10   Ir US Guide Vasc Access Left  04/16/2014   CLINICAL DATA:  renal failure, needs  durable venous access.  EXAM: RIGHT IJ CENTRAL VENOUS CATHETER PLACEMENT UNDER ULTRASOUND AND FLUOROSCOPIC GUIDANCE:  FLUOROSCOPY TIME:  6 min 12 seconds  TECHNIQUE: The procedure, risks (including but not limited to bleeding, infection, organ damage ), benefits, and alternatives were explained to the patient. Questions regarding the procedure were encouraged and answered. The patient understands and consents to the procedure. The patient had an indwelling ready EJ peripheral IV, so initially left-sided approach was  selected. Left neck prepped with chlorhexidine, draped in usual sterile fashion. Maximal barrier sterile technique was utilized including caps, mask, sterile gowns, sterile gloves, sterile drape, hand hygiene and skin antiseptic. Overlying skin infiltrated with 1% lidocaine. Under real-time ultrasound guidance, the left IJ vein was accessed with a 21 gauge micropuncture needle. The guidewire would not advance centrally easily. Small contrast venogram demonstrated occlusion of the central aspect of the left IJ vein. For this reason, a right-sided approach was used. Patency of the right IJ vein was confirmed with ultrasound with image documentation. An appropriate skin site was determined. Skin site was marked. Region was prepped using maximum barrier technique including cap and mask, sterile gown, sterile gloves, large sterile sheet, and Chlorhexidine as cutaneous antisepsis. The region was infiltrated locally with 1% lidocaine. Under real-time ultrasound guidance, the right IJ vein was accessed with a 21 gauge micropuncture needle; the needle tip within the vein was confirmed with ultrasound image documentation. The guidewire would not advance centrally easily. Contrast venogram demonstrated occlusion of the central aspect of the right IJ vein,  with a prominent collateral channel to the SVC. With the aid of an angled Glidewire, this was catheterized. A double-lumen PICC line would not advance through the length of the tortuosity of the course. For this reason, over the Glidewire a single lumen 5 [place] power injectable PICC catheter trimmed to 18 cm was advanced, positioned with the tip in the mid right atrium. Catheter was secured externally and flow flushed per protocol. The patient tolerated the procedure well.  COMPLICATIONS: None immediate  IMPRESSION: 1. Technically successful right IJ Single lumen power injectable central venous catheter placement.   Electronically Signed   By: Arne Cleveland M.D.   On: 04/14/2014 15:10   Ir US Guide Vasc Access Right  04/14/2014   CLINICAL DATA:  renal failure, needs  durable venous access.  EXAM: RIGHT IJ CENTRAL VENOUS CATHETER PLACEMENT UNDER ULTRASOUND AND FLUOROSCOPIC GUIDANCE:  FLUOROSCOPY TIME:  6 min 12 seconds  TECHNIQUE: The procedure, risks (including but not limited to bleeding, infection, organ damage ), benefits, and alternatives were explained to the patient. Questions regarding the procedure were encouraged and answered. The patient understands and consents to the procedure. The patient had an indwelling ready EJ peripheral IV, so initially left-sided approach was selected. Left neck prepped with chlorhexidine, draped in usual sterile fashion. Maximal barrier sterile technique was utilized including caps, mask, sterile gowns, sterile gloves, sterile drape, hand hygiene and skin antiseptic. Overlying skin infiltrated with 1% lidocaine. Under real-time ultrasound guidance, the left IJ vein was accessed with a 21 gauge micropuncture needle. The guidewire would not advance centrally easily. Small contrast venogram demonstrated occlusion of the central aspect of the left IJ vein. For this reason, a right-sided approach was used. Patency of the right IJ vein was confirmed with ultrasound with image  documentation. An appropriate skin site was determined. Skin site was marked. Region was prepped using maximum barrier technique including cap and mask, sterile gown, sterile gloves, large sterile sheet, and Chlorhexidine as cutaneous antisepsis. The region was infiltrated locally with 1% lidocaine. Under real-time ultrasound guidance, the right IJ vein was accessed with a 21 gauge micropuncture needle; the needle tip within the vein was confirmed with ultrasound image documentation. The guidewire would not advance centrally easily. Contrast venogram demonstrated occlusion of the central aspect of the right IJ vein, with a prominent collateral channel to the SVC. With the aid of an angled Glidewire, this was catheterized. A double-lumen  PICC line would not advance through the length of the tortuosity of the course. For this reason, over the Glidewire a single lumen 5 [place] power injectable PICC catheter trimmed to 18 cm was advanced, positioned with the tip in the mid right atrium. Catheter was secured externally and flow flushed per protocol. The patient tolerated the procedure well.  COMPLICATIONS: None immediate  IMPRESSION: 1. Technically successful right IJ Single lumen power injectable central venous catheter placement.   Electronically Signed   By: Arne Cleveland M.D.   On: 04/14/2014 15:10   Dg Chest Port 1 View  04/13/2014   CLINICAL DATA:  Cough, shortness of breath and asthma.  EXAM: PORTABLE CHEST - 1 VIEW  COMPARISON:  01/26/2014  FINDINGS: The cardiac silhouette, mediastinal and hilar contours are stable. Persistent diffuse airspace process with slight further right lower lobe consolidation. Probable small pleural effusions. No pneumothorax.  IMPRESSION: Persistent diffuse airspace process with slight increase in right lower lobe consolidation.   Electronically Signed   By: Kalman Jewels M.D.   On: 04/13/2014 07:42   Ir Shuntogram/ Fistulagram Right Mod Sed  04/16/2014   CLINICAL DATA:   Pain overlying right thigh dialysis graft with right lower extremity edema. 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  EXAM: 1.  DIALYSIS AV SHUNTOGRAM  2.  ANGIOPLASTY OF RIGHT EXTERNAL AND COMMON ILIAC VEINS  COMPARISON:  None  CONTRAST:  50 mL Omnipaque 300  FLUOROSCOPY TIME:  1 min and 54 seconds  MEDICATIONS: 50 mcg IV fentanyl  PROCEDURE: The procedure, risks, benefits, and alternatives were explained to the patient. Questions regarding the procedure were encouraged and answered. The patient understands and consents to the procedure.  The right thigh dialysis graft was prepped with Betadine in a sterile fashion, and a sterile drape was applied covering the operative field. A diagnostic shunt study was performed via an 18 gauge angiocatheter introduced into venous outflow. Venous drainage was assessed to the level of the central veins in the chest. Proximal shunt was studied by reflux maneuver with temporary compression of venous outflow.  The angiocath was removed and replaced with a 7-French sheath over a guidewire. Balloon angioplasty of the right external iliac vein was then performed with a 10 mm x 4 cm Conquest balloon. Angiography was performed.  Additional balloon angioplasty was performed of the right common iliac vein with a 12 mm x 4 cm Atlas balloon.  Balloon angioplasty was followed by additional angiography. The sheath was removed and hemostasis obtained with application of a 2-0 Ethilon pursestring suture.  COMPLICATIONS: None  FINDINGS: The in loop graft in the right thigh is normally patent including arterial and venous anastomoses. The graft itself shows small focal pseudoaneurysms related to prior puncture sites. Venous outflow demonstrates focal stenosis of the distal external iliac vein of approximately 80%. Longer segmental stenosis of the common iliac vein approaches 80% in estimated caliber. The inferior vena cava is widely patent.  After 10 mm balloon angioplasty,  there was improvement in appearance of the external iliac vein stenosis with mild residual roughly 25% stenosis present. There remained moderate degree of common iliac vein narrowing of approximately 60- 70%. After further 12 mm balloon angioplasty of the common iliac vein, there is further improvement in patency with only mild 20- 25% narrowing remaining. Excellent flow was present through the outflow veins on completion.  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 These were successfully treated  with 10 mm and 12 mm balloon angioplasty with significant angiographic improvement, as above.  ACCESS: The graft and venous outflow remain amenable to percutaneous intervention.   Electronically Signed   By: Aletta Edouard M.D.   On: 04/16/2014 17:06    Microbiology: Recent Results (from the past 240 hour(s))  Blood culture (routine x 2)     Status: None   Collection Time: 04/13/14  8:25 AM  Result Value Ref Range Status   Specimen Description BLOOD RIGHT HAND  Final   Special Requests BOTTLES DRAWN AEROBIC ONLY 2MLS  Final   Culture  Setup Time   Final    04/13/2014 14:04 Performed at [facility]    Culture   Final    NO GROWTH 5 DAYS Performed at [facility]    Report Status 04/19/2014 FINAL  Final  Blood culture (routine x 2)     Status: None   Collection Time: 04/13/14  8:35 AM  Result Value Ref Range Status   Specimen Description BLOOD RIGHT NECK  Final   Special Requests   Final    BOTTLES DRAWN AEROBIC AND ANAEROBIC 5MLS RIGHT EJ LINES    Culture  Setup Time   Final    04/13/2014 14:04 Performed at [facility]    Culture   Final    NO GROWTH 5 DAYS Performed at [facility]    Report Status 04/19/2014 FINAL  Final  MRSA PCR Screening     Status: None   Collection Time: 04/13/14  3:32 PM  Result Value Ref Range Status   MRSA by PCR NEGATIVE NEGATIVE Final    Comment:        The  GeneXpert MRSA Assay (FDA approved for NASAL specimens only), is one component of a comprehensive MRSA colonization surveillance program. It is not intended to diagnose MRSA infection nor to guide or monitor treatment for MRSA infections.   Culture, routine-abscess     Status: None   Collection Time: 04/14/14  3:03 PM  Result Value Ref Range Status   Specimen Description ABSCESS LYMPH NODE  Final   Special Requests NONE  Final   Gram Stain   Final    RARE WBC PRESENT,BOTH PMN AND MONONUCLEAR NO SQUAMOUS EPITHELIAL CELLS SEEN NO ORGANISMS SEEN Performed at [facility]    Culture   Final    NO GROWTH 3 DAYS Performed at [facility]    Report Status 04/17/2014 FINAL  Final     Labs: Basic Metabolic Panel:  Recent Labs Lab 04/15/14 0405 04/15/14 1520  04/16/14 0300 04/16/14 0620 04/17/14 0535 04/18/14 0446 04/19/14 0725  NA 127* 136*  < > 137 136* 135* 136* 134*  K 5.3 3.6*  < > 3.7 3.7 4.3 3.9 4.4  CL 85* 94*  < > 96 96 92* 95* 94*  CO2 16* 27  < > 28 26 27 26 27   GLUCOSE 765* 128*  < > 224* 231* 266* 133* 162*  BUN 48* 12  < > 16 16 25* 19 35*  CREATININE 8.70* 3.78*  < > 5.37* 5.72* 9.35* 6.55* 9.41*  CALCIUM 7.9* 9.0  < > 8.9 8.6 9.1 9.2 9.5  MG  --  1.9  --   --   --   --   --   --   PHOS 3.3  --   --   --   --   --   --  2.8  < > = values  in this interval not displayed. Liver Function Tests:  Recent Labs Lab 04/15/14 0405 04/19/14 0725  ALBUMIN 2.5* 2.8*   No results for input(s): LIPASE, AMYLASE in the last 168 hours. No results for input(s): AMMONIA in the last 168 hours. CBC:  Recent Labs Lab 04/14/14 0208 04/15/14 0405 04/18/14 0446 04/19/14 0725  WBC 7.4 5.5 4.5 6.0  HGB 9.0* 8.5* 9.1* 9.3*  HCT 27.6* 27.1* 28.8* 29.3*  MCV 94.8 98.2 96.6 97.7  PLT 195 158 167 188   Cardiac Enzymes: No results for input(s): CKTOTAL, CKMB, CKMBINDEX, TROPONINI in the last 168 hours. BNP: BNP (last 3 results)  Recent Labs   05/03/13 0653 06/27/13 1510 01/14/14 0216  PROBNP 33341.0* 5305.0* 17541.0*   CBG:  Recent Labs Lab 04/19/14 1147 04/19/14 1742 04/19/14 2025 04/20/14 0743 04/20/14 1207  GLUCAP 108* 322* 331* 95 183*       Signed:  Brizza Nathanson  [facility] 04/20/2014, 5:09 PM

## 2014-04-20 NOTE — Progress Notes (Signed)
Occupational Therapy Treatment Patient Details Name: BEXLEY LAUBACH MRN: [id number] DOB: 1971-03-16 Today's Date: 04/20/2014    History of present illness Pt admitted with SOB, cough and hypoxia due to PNA with hyperglycemia.  PMH:  ESRD, DM.   OT comments  Pt. Continues to progress with with acute OT goals.  Pt. Able to navigate in room with intermittent furniture walking.  Completes all toileting with S.  Tearful at end of session due to d/c concerns.  Spoke with CM and SW and CM states she will check back and finalize information for d/c and dialysis with pt.    Follow Up Recommendations  Home health OT    Equipment Recommendations  None recommended by OT          Precautions / Restrictions Precautions Precautions: Fall       Mobility Bed Mobility Overal bed mobility: Independent                Transfers Overall transfer level: Needs assistance Equipment used: None Transfers: Sit to/from Stand Sit to Stand: Supervision Stand pivot transfers: Supervision            Balance                                   ADL Overall ADL's : Needs assistance/impaired                         Toilet Transfer: Supervision/safety;Ambulation   Toileting- Clothing Manipulation and Hygiene: Supervision/safety;Sit to/from stand         General ADL Comments: Cues for integration and use of B hands during tasks.  pt. has good control and use of R hand but does not incorporate without cues      Vision                     Perception     Praxis      Cognition   Behavior During Therapy: WFL for tasks assessed/performed Overall Cognitive Status: Within Functional Limits for tasks assessed                       Extremity/Trunk Assessment               Exercises     Shoulder Instructions       General Comments      Pertinent Vitals/ Pain       Pain Assessment: No/denies pain  Home Living                                           Prior Functioning/Environment              Frequency Min 3X/week     Progress Toward Goals  OT Goals(current goals can now be found in the care plan section)  Progress towards OT goals: Progressing toward goals     Plan Discharge plan remains appropriate    Co-evaluation                 End of Session Equipment Utilized During Treatment: Gait belt   Activity Tolerance Patient tolerated treatment well   Patient Left in chair;with call bell/phone within reach   Nurse Communication Other (comment) (spoke with Education officer, museum and CM regarding  d/c conerns by pt.)        Time: 1100-1129 OT Time Calculation (min): 29 min  Charges: OT General Charges $OT Visit: 1 Procedure OT Treatments $Self Care/Home Management : 23-37 mins  Janice Coffin, COTA/L 04/20/2014, 11:42 AM

## 2014-04-20 NOTE — Progress Notes (Signed)
STROKE TEAM PROGRESS NOTE   HISTORY [name] is a 43 y.o. female with a history of end-stage renal disease, hyperlipidemia, migraine headaches, previous kidney and pancreas transplants, hypertension, diabetes mellitus, and bronchitis who was admitted to [facility] Tuesday 04/13/2014 with shortness of breath and a productive cough.   The patient reported yesterday that on that Tuesday she also developed a migraine headache associated with right-sided numbness and mild right hemiparesis. She has also noted some intermittent slurred speech. An MRI was performed today that was consistent with an acute 6 mm lacunar infarct in the left dorsal pons. A neurology consult was requested. The patient had been on aspirin 81 mg daily prior to admission. She is now on aspirin 325 mg daily. She still complaining of a migraine headache which she rates as an 8 on a 1-10 scale. Benadryl and Compazine have been ordered.  Time last known well: Unable to determine The patient reports that the symptoms started on Tuesday 1117  tPA Given: No TPA - patient is out of the window for treatment.   SUBJECTIVE (INTERVAL HISTORY)  .  Overall she feels her condition is stable. Echo and Dopplers are unremarkable   OBJECTIVE Temp:  [97.8 F (36.6 C)-98.7 F (37.1 C)] 98.3 F (36.8 C) (11/24 0941) Pulse Rate:  [86-101] 88 (11/24 0941) Cardiac Rhythm:  [-]  Resp:  [16-18] 18 (11/24 0941) BP: (101-151)/(46-68) 138/67 mmHg (11/24 0941) SpO2:  [90 %-100 %] 98 % (11/24 0941)   Recent Labs Lab 04/19/14 1147 04/19/14 1742 04/19/14 2025 04/20/14 0743 04/20/14 1207  GLUCAP 108* 322* 331* 95 183*    Recent Labs Lab 04/15/14 0405 04/15/14 1520  04/16/14 0300 04/16/14 0620 04/17/14 0535 04/18/14 0446 04/19/14 0725  NA 127* 136*  < > 137 136* 135* 136* 134*  K 5.3 3.6*  < > 3.7 3.7 4.3 3.9 4.4  CL 85* 94*  < > 96 96 92* 95* 94*  CO2 16* 27  < > 28 26 27 26 27   GLUCOSE 765* 128*  < > 224* 231* 266*  133* 162*  BUN 48* 12  < > 16 16 25* 19 35*  CREATININE 8.70* 3.78*  < > 5.37* 5.72* 9.35* 6.55* 9.41*  CALCIUM 7.9* 9.0  < > 8.9 8.6 9.1 9.2 9.5  MG  --  1.9  --   --   --   --   --   --   PHOS 3.3  --   --   --   --   --   --  2.8  < > = values in this interval not displayed.  Recent Labs Lab 04/15/14 0405 04/19/14 0725  ALBUMIN 2.5* 2.8*    Recent Labs Lab 04/13/14 1631 04/14/14 0208 04/15/14 0405 04/18/14 0446 04/19/14 0725  WBC 9.8 7.4 5.5 4.5 6.0  HGB 9.9* 9.0* 8.5* 9.1* 9.3*  HCT 30.8* 27.6* 27.1* 28.8* 29.3*  MCV 96.6 94.8 98.2 96.6 97.7  PLT 153 195 158 167 188   No results for input(s): CKTOTAL, CKMB, CKMBINDEX, TROPONINI in the last 168 hours. No results for input(s): LABPROT, INR in the last 72 hours. No results for input(s): COLORURINE, LABSPEC, [place], GLUCOSEU, HGBUR, BILIRUBINUR, KETONESUR, PROTEINUR, UROBILINOGEN, NITRITE, LEUKOCYTESUR in the last 72 hours.  Invalid input(s): APPERANCEUR     Component Value Date/Time   CHOL 172 04/20/2014 0553   TRIG 200* 04/20/2014 0553   HDL 49 04/20/2014 0553   CHOLHDL 3.5 04/20/2014 0553   VLDL 40 04/20/2014  [place] 83 04/20/2014 0553   Lab Results  Component Value Date   HGBA1C 12.0* 04/19/2014   No results found for: LABOPIA, COCAINSCRNUR, LABBENZ, AMPHETMU, THCU, LABBARB  No results for input(s): ETH in the last 168 hours.  No results found.   PHYSICAL EXAM Frail middle aged african american lady not in distress. Patient getting dialysis   Afebrile. Head is nontraumatic. Neck is supple without bruit. Hearing is normal. Cardiac exam no murmur or gallop. Lungs are clear to auscultation. Distal pulses are well felt. Neurological Exam : awake alert oriented 3. Slightly diminished attention and recall. Speech appears normal without aphasia or dysarthria. Extraocular movements are full range without nystagmus. Blinks to threat bilaterally. Fundi were not visualized. Vision acuity seems adequate. Face is  symmetric without weakness. Tongue is midline. Motor system exam reveals mild weakness of the right intrinsic hand muscles and proximal shoulder strength limited due to pain. Equal strength and both lower extremities without focal weakness. Diminished touch pinprick sensation in both feet distally due to neuropathy. Coordination is slow but accurate. Gait was not tested. ASSESSMENT/PLAN [name] is a 43 y.o. female with history of end-stage renal disease, hyperlipidemia, migraine headaches, previous kidney and pancreas transplants, hypertension, diabetes mellitus, and bronchitis admitted for SOB and productive cough. In hospital she remembered also having migraine HA, R sided numbness and R sided hemiparesis on Tues as well. Stroke not recognized on admission. Imaging in hospital shows acute stroke. She did not receive IV t-PA due to delay in recognizing symptoms.   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  Resultant  Right sided numbness and possible RLE hemiparesis  MRI  L dorsal pontine infarct, small vessel disease, chronic microhemorrhage L frontal lobe  MRA  Tortuous ICAs, o/w Unremarkable   Carotid Doppler  pending   2D Echo  pending   Heparin 5000 units sq tid for VTE prophylaxis  Diet renal/carb modified with 1200 ml fluid restriction thin liquids  aspirin 81 mg orally every day prior to admission, now on aspirin 325 mg orally every day  Patient counseled to be compliant with her antithrombotic medications  Ongoing aggressive risk factor management  Therapy recommendations:  Possible HH PT, depending on progress  Disposition:  Anticipate return home  Hypertension  Stable  chronic microhemorrhage L frontal lobe on MRI supports history on uncontrolled hypertension   Torturous ICAs on  MRA support hx of chronic hypertension  Patient counseled to be compliant with her blood pressure  medications  Hyperlipidemia  Home meds:  none  LDL 74, goal < 70  Continue simvastatin 20 mg daily  Diabetes, type I  S/p pancreatic transplant  HgbA1c 11.4 in July, not at goal < 7.0  Uncontrolled  Diabetic retinopathy  Other Stroke Risk Factors  Obesity, Body mass index is 28.67 kg/(m^2).   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 I have made any additions or clarifications directly to the above note. Agree with note above.  She has a small pontine infarct secondary to small vessel disease. Continue aspirin 325 mg daily and simvaststin 20 mg daily.Completed stroke workup and stroke team will sign off. Follow up in stroke clinic in 4 weeks.  Antony Contras, MD Medical Director [facility] Pager: [phone] 04/20/2014 12:32 PM     To contact Stroke Continuity provider, please refer to [email or web address] After hours, contact General Neurology

## 2014-04-20 NOTE — Progress Notes (Signed)
Physical Therapy Treatment Patient Details Name: [name] MRN: [id number] DOB: [birth year] Today's Date: 04/20/2014    History of Present Illness Pt admitted with SOB, cough and hypoxia due to PNA with hyperglycemia.  PMH:  ESRD, DM.    PT Comments    Pt progressing towards physical therapy goals. Pt was able to perform transfers and ambulation with improved independence, as well as negotiate a total of 15 steps with min guard assist for safety. Pt states she feels uneasy about returning home, however from a PT perspective pt is progressing well and appears to be safe and steady with the RW. If pt feels she needs follow up at discharge, HHPT can be arranged.   Follow Up Recommendations  Supervision - Intermittent;No PT follow up     Equipment Recommendations  Rolling walker with 5" wheels    Recommendations for Other Services       Precautions / Restrictions Precautions Precautions: Fall Restrictions Weight Bearing Restrictions: No    Mobility  Bed Mobility Overal bed mobility: Independent             General bed mobility comments: Pt sitting up in recliner upon PT arrival.   Transfers Overall transfer level: Needs assistance Equipment used: Rolling walker (2 wheeled);None Transfers: Sit to/from Stand Sit to Stand: Supervision Stand pivot transfers: Supervision       General transfer comment: Pt able to power-up to full standing with no physical assistance and no LOB.   Ambulation/Gait Ambulation/Gait assistance: Min guard;Supervision Ambulation Distance (Feet): 150 Feet Assistive device: Rolling walker (2 wheeled);None Gait Pattern/deviations: Step-through pattern;Decreased stride length;Trunk flexed;Decreased weight shift to right Gait velocity: Decreased Gait velocity interpretation: Below normal speed for age/gender General Gait Details: Pt showing decreased step/stride length on the R when first began ambulating, however was able to correct quickly  and without cueing. Pt states she feels more comfortable without the RW, however general gait pattern and unsteadiness appears to be significantly improved with the RW.    Stairs Stairs: Yes Stairs assistance: Min guard Stair Management: One rail Left;Step to pattern;Sideways Number of Stairs: 15 General stair comments: Pt was able to ascend/descend 15 steps over 3 practice sessions. Pt was instructed in both forward and sideways techniques. Pt did not show any unsteadiness however she states she feels uneasy.   Wheelchair Mobility    Modified Rankin (Stroke Patients Only)       Balance Overall balance assessment: Needs assistance Sitting-balance support: Feet supported;No upper extremity supported Sitting balance-Leahy Scale: Normal     Standing balance support: No upper extremity supported Standing balance-Leahy Scale: Fair                      Cognition Arousal/Alertness: Awake/alert Behavior During Therapy: WFL for tasks assessed/performed Overall Cognitive Status: Within Functional Limits for tasks assessed                      Exercises General Exercises - Lower Extremity Quad Sets: 10 reps Long Arc Quad: 10 reps Hip ABduction/ADduction: 10 reps    General Comments        Pertinent Vitals/Pain Pain Assessment: No/denies pain    Home Living                      Prior Function            PT Goals (current goals can now be found in the care plan section) Acute Rehab PT Goals  Patient Stated Goal: regain R side coordination PT Goal Formulation: With patient Time For Goal Achievement: 04/25/14 Potential to Achieve Goals: Good Progress towards PT goals: Progressing toward goals    Frequency  Min 3X/week    PT Plan Current plan remains appropriate    Co-evaluation             End of Session Equipment Utilized During Treatment: Gait belt Activity Tolerance: Patient tolerated treatment well Patient left: in chair;with  call bell/phone within reach     Time: 1356-1423 PT Time Calculation (min) (ACUTE ONLY): 27 min  Charges:  $Gait Training: 8-22 mins $Therapeutic Activity: 8-22 mins                    G Codes:      Rolinda Roan 04-27-14, 2:55 PM   Rolinda Roan, PT, DPT Acute Rehabilitation Services Pager: [phone]

## 2014-04-20 NOTE — Care Management Note (Signed)
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   Anticipated DC Date:  04/21/2014   Anticipated DC Plan:  [place]  CM consult      Choice offered to / List presented to:             Status of service:  In process, will continue to follow Medicare Important Message given?  YES (If response is "NO", the following Medicare IM given date fields will be blank) Date Medicare IM given:  04/16/2014 Medicare IM given by:  MAYO,HENRIETTA Date Additional Medicare IM given:  04/19/2014 Additional Medicare IM given by:  [facility]  Discharge Disposition:    Per UR Regulation:  Reviewed for med. necessity/level of care/duration of stay  If discussed at [place] of Stay Meetings, dates discussed:    Comments:  04/20/2014 Met with pt re plan to d/c today, will order any DME needed. PT eval not recommending HHPT. This CM has asked the pt to call her transportation system re need to have HD tomorrow due to holiday schedule. Pt currently working on this.  CRoyal RN MPH, case manager, [phone]

## 2014-04-20 NOTE — Plan of Care (Signed)
Problem: Phase I Progression Outcomes Goal: Education officer, museum consult (if SNF or HD pt.) Outcome: Completed/Met Date Met:  04/20/14  Problem: Phase II Progression Outcomes Goal: Tol increased activity, up in chair for at least 4 hrs/HD pt Outcome: Completed/Met Date Met:  04/20/14  Problem: Phase III Progression Outcomes Goal: Up in chair for Hemodialysis Outcome: Completed/Met Date Met:  04/20/14

## 2014-04-21 DIAGNOSIS — IMO0002 Reserved for concepts with insufficient information to code with codable children: Secondary | ICD-10-CM | POA: Insufficient documentation

## 2014-04-21 DIAGNOSIS — E162 Hypoglycemia, unspecified: Secondary | ICD-10-CM

## 2014-04-21 DIAGNOSIS — G43709 Chronic migraine without aura, not intractable, without status migrainosus: Secondary | ICD-10-CM | POA: Insufficient documentation

## 2014-04-21 LAB — CBC
HEMATOCRIT: 28.8 % — AB (ref 36.0–46.0)
Hemoglobin: 9.1 g/dL — ABNORMAL LOW (ref 12.0–15.0)
MCH: 31.8 pg (ref 26.0–34.0)
MCHC: 31.6 g/dL (ref 30.0–36.0)
MCV: 100.7 fL — ABNORMAL HIGH (ref 78.0–100.0)
Platelets: 202 10*3/uL (ref 150–400)
RBC: 2.86 MIL/uL — AB (ref 3.87–5.11)
RDW: 15 % (ref 11.5–15.5)
WBC: 4.5 10*3/uL (ref 4.0–10.5)

## 2014-04-21 LAB — GLUCOSE, CAPILLARY
GLUCOSE-CAPILLARY: 133 mg/dL — AB (ref 70–99)
Glucose-Capillary: 168 mg/dL — ABNORMAL HIGH (ref 70–99)
Glucose-Capillary: 337 mg/dL — ABNORMAL HIGH (ref 70–99)

## 2014-04-21 LAB — RENAL FUNCTION PANEL
ALBUMIN: 2.8 g/dL — AB (ref 3.5–5.2)
Anion gap: 11 (ref 5–15)
BUN: 40 mg/dL — ABNORMAL HIGH (ref 6–23)
CO2: 29 mEq/L (ref 19–32)
Calcium: 10 mg/dL (ref 8.4–10.5)
Chloride: 96 mEq/L (ref 96–112)
Creatinine, Ser: 8.56 mg/dL — ABNORMAL HIGH (ref 0.50–1.10)
GFR calc non Af Amer: 5 mL/min — ABNORMAL LOW (ref >90)
GFR, EST AFRICAN AMERICAN: 6 mL/min — AB (ref >90)
Glucose, Bld: 88 mg/dL (ref 70–99)
PHOSPHORUS: 2.1 mg/dL — AB (ref 2.3–4.6)
Potassium: 4.3 mEq/L (ref 3.7–5.3)
SODIUM: 136 meq/L — AB (ref 137–147)

## 2014-04-21 MED ORDER — SODIUM CHLORIDE 0.9 % IV SOLN: 100.0000 mL | INTRAVENOUS | Status: DC | PRN

## 2014-04-21 MED ORDER — LIDOCAINE-PRILOCAINE 2.5-2.5 % EX CREA: 1.0000 "application " | TOPICAL_CREAM | CUTANEOUS | Status: DC | PRN

## 2014-04-21 MED ORDER — LIDOCAINE HCL (PF) 1 % IJ SOLN: 5.0000 mL | INTRAMUSCULAR | Status: DC | PRN

## 2014-04-21 MED ORDER — ONDANSETRON HCL 4 MG/2ML IJ SOLN
INTRAMUSCULAR | Status: AC
  Administered 2014-04-21: 4 mg
  Filled 2014-04-21: qty 2

## 2014-04-21 MED ORDER — HEPARIN SODIUM (PORCINE) 1000 UNIT/ML DIALYSIS: 20.0000 [IU]/kg | INTRAMUSCULAR | Status: DC | PRN

## 2014-04-21 MED ORDER — NEPRO/CARBSTEADY PO LIQD: 237.0000 mL | ORAL | Status: DC | PRN

## 2014-04-21 MED ORDER — DOXERCALCIFEROL 4 MCG/2ML IV SOLN
INTRAVENOUS | Status: AC
  Filled 2014-04-21: qty 2

## 2014-04-21 MED ORDER — ALTEPLASE 2 MG IJ SOLR: 2.0000 mg | Freq: Once | INTRAMUSCULAR | Status: DC | PRN

## 2014-04-21 MED ORDER — HEPARIN SODIUM (PORCINE) 1000 UNIT/ML DIALYSIS: 1000.0000 [IU] | INTRAMUSCULAR | Status: DC | PRN

## 2014-04-21 MED ORDER — PENTAFLUOROPROP-TETRAFLUOROETH EX AERO: 1.0000 "application " | INHALATION_SPRAY | CUTANEOUS | Status: DC | PRN

## 2014-04-21 MED ORDER — DARBEPOETIN ALFA 60 MCG/0.3ML IJ SOSY
PREFILLED_SYRINGE | INTRAMUSCULAR | Status: AC
  Filled 2014-04-21: qty 0.3

## 2014-04-21 MED ORDER — NEPRO/CARBSTEADY PO LIQD
237.0000 mL | Freq: Two times a day (BID) | ORAL | Status: DC
  Administered 2014-04-21 – 2014-04-23 (×4): 237 mL via ORAL

## 2014-04-21 NOTE — Progress Notes (Signed)
[facility] Rounding Note Subjective:   On dialysis Today says she has no place to go at discharge because can't pay her rent Social worker at dialysis was unable to get anything set up - told her to ask Case Management here No new symptoms  Objective: Vital signs in last 24 hours: Temp:  [98 F (36.7 C)-98.3 F (36.8 C)] 98 F (36.7 C) (11/25 0435) Pulse Rate:  [88-96] 91 (11/25 0435) Resp:  [17-18] 17 (11/25 0435) BP: (127-143)/(60-67) 127/60 mmHg (11/25 0435) SpO2:  [97 %-100 %] 97 % (11/25 0435) Weight:  [70.761 kg (156 lb)] 70.761 kg (156 lb) (11/24 2231) Weight change: -1.439 kg (-3 lb 2.8 oz)  Intake/Output from previous day: 11/24 0701 - 11/25 0700 In: 1685 [P.O.:480; I.V.:1100; IV Piggyback:105] Out: -  Intake/Output this shift:   EXAM: General appearance:  Alert, in no apparent distress Resp:  CTA without rales, rhonchi, or wheezes Cardio:  RRR with Gr II/VI systolic murmur, no rub GI:  + BS, soft and nontender Extremities:  No edema, R upper & lower extremity weakness Access:  R thigh AVG with + bruit - currently cannulated for dialysis  Lab Results:  Recent Labs  04/19/14 0725 04/21/14 0457  WBC 6.0 4.5  HGB 9.3* 9.1*  HCT 29.3* 28.8*  PLT 188 202     Recent Labs  04/19/14 0725 04/21/14 0457  NA 134* 136*  K 4.4 4.3  CL 94* 96  CO2 27 29  GLUCOSE 162* 88  BUN 35* 40*  CREATININE 9.41* 8.56*  CALCIUM 9.5 10.0  ALBUMIN 2.8* 2.8*    Scheduled Medications . aspirin  325 mg Oral Daily  . calcium acetate  2,001 mg Oral TID WC  . darbepoetin (ARANESP) injection - DIALYSIS  60 mcg Intravenous Once  . [START ON 04/29/2014] darbepoetin (ARANESP) injection - DIALYSIS  60 mcg Intravenous Q Thu-HD  . doxercalciferol  2 mcg Intravenous Q T,Th,Sa-HD  . ferric gluconate (FERRLECIT/NULECIT) IV  62.5 mg Intravenous Q Thu-HD  . gabapentin  100 mg Oral Daily  . heparin  5,000 Units Subcutaneous 3 times per day  . insulin aspart  0-5 Units  Subcutaneous QHS  . insulin aspart  0-9 Units Subcutaneous TID WC  . insulin glargine  7 Units Subcutaneous BID WC  . loratadine  10 mg Oral Daily  . montelukast  10 mg Oral QHS  . multivitamin  1 tablet Oral Daily  . pantoprazole  40 mg Oral QHS  . simvastatin  20 mg Oral QHS  . sodium chloride  10-40 mL Intracatheter Q12H  . sodium chloride  3 mL Intravenous Q12H   HD: [facility]160 3h 22min, 68.5kh 2/2.5 Bath R thigh AVG (PTA this adm 11/20) Heparin none Hect 2 ug TIW, Aranesp 25 mcg weekly Increased to 60 this adm Venofer 50/wk maint   Assessment/Plan: 1. 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 Not felt to need home PT. Rolling walker recommended   2. Pulmonary edema - resolved (original reason for admission volume overload d/t missed dialysis). 3. ESRD - HD on TTS @ [facility], K 4.4.  HD today per holiday schedule. 4. HTN/Volume - BP 111/52, no meds; wt 68.8 kg s/p net UF 2.1 L yesterday. 5. Anemia - Hgb 9.3, Aranesp increased to 60 mcg on Wed (per holiday schedule), weekly Fe.. 6. Sec HPT - Ca 9.5 (10.5 corrected), P  2.8; Hectorol 2 mcg, Phoslo 3 with meals.  Use 2Ca bath. 7. Nutrition - Alb 2.8, carb-mod renal diet, vitamin 8. Disposition - has been staying in Extended Stay hotel.  There is an issue she tells me with the rent. Says no place to go at discharge if she can't pay her rent/bill. (I spoke with SW at her dialysis unit - she owes $40 from last week, $180 for this week and will owe $180 next week.  Social worker at [facility] has investigated - here are no community resources to the pt to pay her bill.Marland KitchenMarland KitchenWill need to ask Case Management here.)   LOS: 8 days   [name] B 04/21/2014,7:36 AM

## 2014-04-21 NOTE — Procedures (Signed)
I have personally attended this patient's dialysis session.  Thigh AVG BFR 400 BP stable  2K2Ca bath, (K 4.3 Ca 10)  Jamal Maes, MD [facility] [phone] Pager 04/21/2014, 7:54 AM

## 2014-04-21 NOTE — Progress Notes (Signed)
INITIAL NUTRITION ASSESSMENT  DOCUMENTATION CODES Per approved criteria  -Not Applicable   INTERVENTION: Provide Nepro Shake po BID, each supplement provides 425 kcal and 19 grams protein.  NUTRITION DIAGNOSIS: Increased nutrient needs related to chronic illness as evidenced by estimated nutrition needs.   Goal: Pt to meet >/= 90% of their estimated nutrition needs   Monitor:  PO intake, weight trends, labs, I/O's  Reason for Assessment: MST  43 y.o. female  Admitting Dx: HCAP  ASSESSMENT: Pt with hx of DM1, failed kidney /pancreas transplant, ESRD on HD TTS ,presenting with SOB to ED. Missed last HD on Sat because daughter had a bad car accident that day.developed SOB early this am, as well complaining of cough, productive pneumonia, denies fever chills or chest pain,. Chest x-ray showing edema, possible pneumonia on x-ray.  Meal completion has been 100%. Pt reports her appetite has been decreased over the past 2 months, however it has improved. Pt reports she has been consuming 1-2 meals a day. Pt reports no weight loss. Pt reports she does regularly drink Nepro (when she is able to pay for it) at least 3 a day and reports she would like some ordered. Will order. Pt was encouraged to eat her food at meals and to drink her supplements.  Pt with no observed significant fat or muscle mass loss.  Height: Ht Readings from Last 1 Encounters:  04/13/14 5\' 1"  (1.549 m)    Weight: Wt Readings from Last 1 Encounters:  04/21/14 149 lb 0.5 oz (67.6 kg)    Ideal Body Weight: 105 lbs  % Ideal Body Weight: 142%  Wt Readings from Last 10 Encounters:  04/21/14 149 lb 0.5 oz (67.6 kg)  04/18/14 151 lb (68.493 kg)  01/26/14 154 lb 5.2 oz (70 kg)  01/22/14 149 lb 4 oz (67.7 kg)  01/14/14 150 lb (68.04 kg)  11/30/13 140 lb 14 oz (63.9 kg)  10/16/13 151 lb 6 oz (68.663 kg)  06/27/13 150 lb (68.04 kg)  05/09/13 150 lb 8 oz (68.266 kg)  04/11/13 149 lb 0.5 oz (67.6 kg)    Usual  Body Weight: 150 lbs  % Usual Body Weight: 99%  BMI:  Body mass index is 28.17 kg/(m^2).  Estimated Nutritional Needs: Kcal: 1900-2100 Protein: 85-105 grams Fluid: 1.2 L/day  Skin: non-pitting RLE edema  Diet Order: Diet renal/carb modified with 1200 ml fluid restriction  EDUCATION NEEDS: -No education needs identified at this time   Intake/Output Summary (Last 24 hours) at 04/21/14 1551 Last data filed at 04/21/14 1118  Gross per 24 hour  Intake   1315 ml  Output   2000 ml  Net   -685 ml    Last BM: 11/24  Labs:   Recent Labs Lab 04/15/14 0405 04/15/14 1520  04/18/14 0446 04/19/14 0725 04/21/14 0457  NA 127* 136*  < > 136* 134* 136*  K 5.3 3.6*  < > 3.9 4.4 4.3  CL 85* 94*  < > 95* 94* 96  CO2 16* 27  < > 26 27 29   BUN 48* 12  < > 19 35* 40*  CREATININE 8.70* 3.78*  < > 6.55* 9.41* 8.56*  CALCIUM 7.9* 9.0  < > 9.2 9.5 10.0  MG  --  1.9  --   --   --   --   PHOS 3.3  --   --   --  2.8 2.1*  GLUCOSE 765* 128*  < > 133* 162* 88  < > = values  in this interval not displayed.  CBG (last 3)   Recent Labs  04/20/14 1641 04/20/14 2227 04/21/14 1223  GLUCAP 90 120* 168*    Scheduled Meds: . aspirin  325 mg Oral Daily  . calcium acetate  2,001 mg Oral TID WC  . Darbepoetin Alfa      . [START ON 04/29/2014] darbepoetin (ARANESP) injection - DIALYSIS  60 mcg Intravenous Q Thu-HD  . doxercalciferol      . doxercalciferol  2 mcg Intravenous Q T,Th,Sa-HD  . ferric gluconate (FERRLECIT/NULECIT) IV  62.5 mg Intravenous Q Thu-HD  . gabapentin  100 mg Oral Daily  . heparin  5,000 Units Subcutaneous 3 times per day  . insulin aspart  0-5 Units Subcutaneous QHS  . insulin aspart  0-9 Units Subcutaneous TID WC  . insulin glargine  7 Units Subcutaneous BID WC  . loratadine  10 mg Oral Daily  . montelukast  10 mg Oral QHS  . multivitamin  1 tablet Oral Daily  . pantoprazole  40 mg Oral QHS  . simvastatin  20 mg Oral QHS  . sodium chloride  10-40 mL Intracatheter  Q12H  . sodium chloride  3 mL Intravenous Q12H    Continuous Infusions: . sodium chloride 10 mL/hr at 04/19/14 1246    Past Medical History  Diagnosis Date  . Hyperlipidemia   . Alopecia   . Obesity   . Iron deficiency   . ESRD (end stage renal disease)     S/p pancreatic and kidney transplant, but back on HD 2008, dialysis at [facility] [place], Sat  . RLS (restless legs syndrome)   . Injury of conjunctiva and corneal abrasion of right eye without foreign body   . Cellulitis   . Anemia   . Blood transfusion 2004    "when I had my transplant"  . Migraine   . Hyperparathyroidism, secondary   . Diabetic retinopathy   . Hypertension     takes Metoprolol and Exforge daily  . Depression     takes Klonopin nightly  . Diabetes mellitus type 1     Pt states diagnosed at age 15 with prior episodes of DKA. S/p pancreatic transplant   . GERD (gastroesophageal reflux disease)     takes Protonix daily  . Bronchitis   . Migraine   . Asthma   . Emphysema of lung   . Angina     none in past 2 years.  . Stomach pain     Hx: of chronic  . Pneumonia 2012    "double"  . Dialysis patient     tues,thurs,sat  . 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  Surgery was done at [facility].       Past Surgical History  Procedure Laterality Date  . 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 Thyroglobulin      x 7  . Av fistula placement      right upper arm  . Cholecystectomy  1995  .  hd graft placement/removal      "had 2 in my left upper arm"  . Eye surgery    . Retinopathy surgery    . Tooth extraction  10/10/11    X's two  . Insertion of dialysis catheter  12/08/2011    Procedure: INSERTION OF DIALYSIS CATHETER;  Surgeon: Angelia Mould, MD;  Location: [place];  Service: Vascular;  Laterality: Left;  . Av fistula placement  01/22/2012    Procedure: INSERTION OF ARTERIOVENOUS (AV) GORE-TEX GRAFT ARM;  Surgeon: Angelia Mould, MD;  Location: [place];  Service: Vascular;  Laterality: Left;  Ultrasound guided.  . Av fistula placement  03/14/2012    Procedure: INSERTION OF ARTERIOVENOUS (AV) GORE-TEX GRAFT THIGH;  Surgeon: Angelia Mould, MD;  Location: [place];  Service: Vascular;  Laterality: Right;  . False aneurysm repair Right 01/02/2013    Procedure: Excision of lymphocele in right thigh.;  Surgeon: Angelia Mould, MD;  Location: [place];  Service: Vascular;  Laterality: Right;  . Carpal tunnel release Right 03/02/2013    Procedure: CARPAL TUNNEL RELEASE;  Surgeon: Tennis Must, MD;  Location: [place];  Service: Orthopedics;  Laterality: Right;  . Flexible sigmoidoscopy N/A 03/24/2013    Procedure: FLEXIBLE SIGMOIDOSCOPY;  Surgeon: Cleotis Nipper, MD;  Location: [facility] ENDOSCOPY;  Service: Endoscopy;  Laterality: N/A;  . Revision of arteriovenous goretex graft Right 04/02/2013    Procedure: REPAIR OF PSEUDOANEURYSM OF ARTERIOVENOUS GORETEX GRAFT;  Surgeon: Angelia Mould, MD;  Location: [place];  Service: Vascular;  Laterality: Right;  . Carpal tunnel release Left 10/16/2013    Procedure: LEFT CARPAL TUNNEL RELEASE;  Surgeon: Tennis Must, MD;  Location: [place];  Service: Orthopedics;  Laterality: Left;    Kallie Locks, MS, RD, LDN Pager # [phone] After hours/ weekend pager # [phone]

## 2014-04-21 NOTE — Progress Notes (Signed)
PROGRESS NOTE  [name] DXA:[id number] DOB: 05-Jun-1970 DOA: 04/13/2014 PCP: [name], MD  Assessment/Plan: Acute respiratory failure with hypoxia due to volume overload -resolved -> 3900 cc removed via UF 11/17 with 2100 cc removed 11/19 -no clinical evidence to suggest PNA  -weaned off oxygen   ESRD (end stage renal disease) -per Nephro  -cont HD -shuntogram completed per IR 11/20  L pons infarct/CVA -with R arm numbness and weakness, improving -reportedly for 4days but not documented on previous provider notes -on Neuro exam, mild weakness and sensory deficits on R and diminished grip  -MRI was completed 11/22 which showed 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 -Now on ASA 325mg  and on low dose statin -2D ECHo with normal EF, no cardiac source of embolus, Carotid duplex with 1-39% stenosis -hbaic 11.4 in July'14, LDL 83 -PT/Ot eval completed, home health services set up   DKA (diabetic ketoacidoses) and hypoglycemia -onset after admit and required D10 -11/18 CBG was > 300 on D10 so dc'd D10 and was transitioned to Lantus but at 1/2 home dose since she is usually very brittle -overnight into 11/19 went back into DKA (glucose 765, AG 26) so back on insulin drip  -pm of 11/19 transitioned back to Lantus, then had hypoglycemia, now resolved, monitor -has a well established hx of brittle DM. -CBGs fluctuating but without hypoglycemia at this time   GERD/Gastroparesis -cont PPI   RLS (restless legs syndrome)    Non compliance     Family Communication:   Pt at beside Disposition Plan:   Home when medically stable       Procedures/Studies: Dg Chest 2 View  04/14/2014   CLINICAL DATA:  Evaluate for possible pneumonia, shortness of breath, productive cuff  EXAM: CHEST  2 VIEW  COMPARISON:  04/13/2014  FINDINGS: Cardiomediastinal silhouette is stable. There is improvement in aeration without convincing pulmonary edema or  segmental infiltrate. Residual mild interstitial prominence bilateral upper lobes. Bony thorax is unremarkable.  IMPRESSION: Improvement in aeration without convincing pulmonary edema. No segmental infiltrate. Residual mild interstitial prominence bilateral upper lobes.   Electronically Signed   By: Lahoma Crocker M.D.   On: 04/14/2014 12:05   Mr [name] Contrast  04/18/2014   CLINICAL DATA:  43 year old female with acute numbness and tingling in the right side extremity for 5 days. Initial encounter.  EXAM: MRI HEAD WITHOUT CONTRAST  MRA HEAD WITHOUT CONTRAST  TECHNIQUE: Multiplanar, multiecho pulse sequences of the brain and surrounding structures were obtained without intravenous contrast. Angiographic images of the head were obtained using MRA technique without contrast.  COMPARISON:  Non contrast head CTs 06/27/2013 and earlier.  FINDINGS: MRI HEAD FINDINGS  6 mm focus of restricted diffusion in the dorsal pons on the left (series 4, image 9). Associated mild T2 and FLAIR hyperintensity. No associated mass effect or hemorrhage.  No other restricted diffusion. Major intracranial vascular flow voids are preserved.  No midline shift, mass effect, evidence of mass lesion, ventriculomegaly, extra-axial collection or acute intracranial hemorrhage. Cervicomedullary junction and pituitary are within normal limits. Chronic lacunar been *SCRATCH* sec chronic lacunar infarct in the left globus pallidus. Mild nonspecific mostly periventricular cerebral white matter T2 and FLAIR hyperintensity possible chronic micro hemorrhages in the anterior left frontal lobe (series 8, image 15). No cortical encephalomalacia. Cerebellum within normal limits. Visible internal auditory structures appear normal.  Trace left mastoid effusion. Negative visualized nasopharynx. Other Visualized paranasal sinuses  and mastoids are clear. Visualized orbit soft tissues are within normal limits. Visualized scalp soft tissues are within normal  limits.  Nonspecific decreased T1 bone marrow signal throughout.  MRA HEAD FINDINGS  Antegrade flow in the posterior circulation. Codominant distal vertebral arteries. Dominant, normal AICA origins. No basilar stenosis. SCA and PCA origins are normal. Normal posterior communicating arteries. Bilateral PCA branches are normal.  Antegrade flow in both ICA siphons. Tortuous distal cervical ICAs, more so the right 8. No siphon stenosis. Ophthalmic and posterior communicating artery origins are within normal limits. Normal carotid termini, MCA and ACA origins.  Anterior communicating artery and visualized bilateral ACA and MCA branches are within normal limits.  IMPRESSION: 1. Acute 6 mm lacunar infarct in the left dorsal pons. No mass effect or hemorrhage. 2. Other evidence of chronic small vessel disease including left globus pallidus chronic lacune, possible chronic micro hemorrhages left frontal lobe. 3. Negative intracranial MRA, but tortuous cervical ICAs raising the possibility of chronic hypertension.   Electronically Signed   By: Lars Pinks M.D.   On: 04/18/2014 11:27   Mr [name] Contrast  04/18/2014   CLINICAL DATA:  43 year old female with acute numbness and tingling in the right side extremity for 5 days. Initial encounter.  EXAM: MRI HEAD WITHOUT CONTRAST  MRA HEAD WITHOUT CONTRAST  TECHNIQUE: Multiplanar, multiecho pulse sequences of the brain and surrounding structures were obtained without intravenous contrast. Angiographic images of the head were obtained using MRA technique without contrast.  COMPARISON:  Non contrast head CTs 06/27/2013 and earlier.  FINDINGS: MRI HEAD FINDINGS  6 mm focus of restricted diffusion in the dorsal pons on the left (series 4, image 9). Associated mild T2 and FLAIR hyperintensity. No associated mass effect or hemorrhage.  No other restricted diffusion. Major intracranial vascular flow voids are preserved.  No midline shift, mass effect, evidence of mass lesion,  ventriculomegaly, extra-axial collection or acute intracranial hemorrhage. Cervicomedullary junction and pituitary are within normal limits. Chronic lacunar been *SCRATCH* sec chronic lacunar infarct in the left globus pallidus. Mild nonspecific mostly periventricular cerebral white matter T2 and FLAIR hyperintensity possible chronic micro hemorrhages in the anterior left frontal lobe (series 8, image 15). No cortical encephalomalacia. Cerebellum within normal limits. Visible internal auditory structures appear normal.  Trace left mastoid effusion. Negative visualized nasopharynx. Other Visualized paranasal sinuses and mastoids are clear. Visualized orbit soft tissues are within normal limits. Visualized scalp soft tissues are within normal limits.  Nonspecific decreased T1 bone marrow signal throughout.  MRA HEAD FINDINGS  Antegrade flow in the posterior circulation. Codominant distal vertebral arteries. Dominant, normal AICA origins. No basilar stenosis. SCA and PCA origins are normal. Normal posterior communicating arteries. Bilateral PCA branches are normal.  Antegrade flow in both ICA siphons. Tortuous distal cervical ICAs, more so the right 8. No siphon stenosis. Ophthalmic and posterior communicating artery origins are within normal limits. Normal carotid termini, MCA and ACA origins.  Anterior communicating artery and visualized bilateral ACA and MCA branches are within normal limits.  IMPRESSION: 1. Acute 6 mm lacunar infarct in the left dorsal pons. No mass effect or hemorrhage. 2. Other evidence of chronic small vessel disease including left globus pallidus chronic lacune, possible chronic micro hemorrhages left frontal lobe. 3. Negative intracranial MRA, but tortuous cervical ICAs raising the possibility of chronic hypertension.   Electronically Signed   By: Lars Pinks M.D.   On: 04/18/2014 11:27   Ir Veno/jugular Left  04/16/2014   CLINICAL DATA:  renal  failure, needs  durable venous access.  EXAM:  RIGHT IJ CENTRAL VENOUS CATHETER PLACEMENT UNDER ULTRASOUND AND FLUOROSCOPIC GUIDANCE:  FLUOROSCOPY TIME:  6 min 12 seconds  TECHNIQUE: The procedure, risks (including but not limited to bleeding, infection, organ damage ), benefits, and alternatives were explained to the patient. Questions regarding the procedure were encouraged and answered. The patient understands and consents to the procedure. The patient had an indwelling ready EJ peripheral IV, so initially left-sided approach was selected. Left neck prepped with chlorhexidine, draped in usual sterile fashion. Maximal barrier sterile technique was utilized including caps, mask, sterile gowns, sterile gloves, sterile drape, hand hygiene and skin antiseptic. Overlying skin infiltrated with 1% lidocaine. Under real-time ultrasound guidance, the left IJ vein was accessed with a 21 gauge micropuncture needle. The guidewire would not advance centrally easily. Small contrast venogram demonstrated occlusion of the central aspect of the left IJ vein. For this reason, a right-sided approach was used. Patency of the right IJ vein was confirmed with ultrasound with image documentation. An appropriate skin site was determined. Skin site was marked. Region was prepped using maximum barrier technique including cap and mask, sterile gown, sterile gloves, large sterile sheet, and Chlorhexidine as cutaneous antisepsis. The region was infiltrated locally with 1% lidocaine. Under real-time ultrasound guidance, the right IJ vein was accessed with a 21 gauge micropuncture needle; the needle tip within the vein was confirmed with ultrasound image documentation. The guidewire would not advance centrally easily. Contrast venogram demonstrated occlusion of the central aspect of the right IJ vein, with a prominent collateral channel to the SVC. With the aid of an angled Glidewire, this was catheterized. A double-lumen PICC line would not advance through the length of the tortuosity of  the course. For this reason, over the Glidewire a single lumen 5 [place] power injectable PICC catheter trimmed to 18 cm was advanced, positioned with the tip in the mid right atrium. Catheter was secured externally and flow flushed per protocol. The patient tolerated the procedure well.  COMPLICATIONS: None immediate  IMPRESSION: 1. Technically successful right IJ Single lumen power injectable central venous catheter placement.   Electronically Signed   By: Arne Cleveland M.D.   On: 04/14/2014 15:10   Ir Pta Venous Right  04/16/2014   CLINICAL DATA:  Pain overlying right thigh dialysis graft with right lower extremity edema. 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  EXAM: 1.  DIALYSIS AV SHUNTOGRAM  2.  ANGIOPLASTY OF RIGHT EXTERNAL AND COMMON ILIAC VEINS  COMPARISON:  None  CONTRAST:  50 mL Omnipaque 300  FLUOROSCOPY TIME:  1 min and 54 seconds  MEDICATIONS: 50 mcg IV fentanyl  PROCEDURE: The procedure, risks, benefits, and alternatives were explained to the patient. Questions regarding the procedure were encouraged and answered. The patient understands and consents to the procedure.  The right thigh dialysis graft was prepped with Betadine in a sterile fashion, and a sterile drape was applied covering the operative field. A diagnostic shunt study was performed via an 18 gauge angiocatheter introduced into venous outflow. Venous drainage was assessed to the level of the central veins in the chest. Proximal shunt was studied by reflux maneuver with temporary compression of venous outflow.  The angiocath was removed and replaced with a 7-French sheath over a guidewire. Balloon angioplasty of the right external iliac vein was then performed with a 10 mm x 4 cm Conquest balloon. Angiography was performed.  Additional balloon angioplasty was performed of the right common iliac  vein with a 12 mm x 4 cm Atlas balloon.  Balloon angioplasty was followed by additional angiography. The sheath was  removed and hemostasis obtained with application of a 2-0 Ethilon pursestring suture.  COMPLICATIONS: None  FINDINGS: The in loop graft in the right thigh is normally patent including arterial and venous anastomoses. The graft itself shows small focal pseudoaneurysms related to prior puncture sites. Venous outflow demonstrates focal stenosis of the distal external iliac vein of approximately 80%. Longer segmental stenosis of the common iliac vein approaches 80% in estimated caliber. The inferior vena cava is widely patent.  After 10 mm balloon angioplasty, there was improvement in appearance of the external iliac vein stenosis with mild residual roughly 25% stenosis present. There remained moderate degree of common iliac vein narrowing of approximately 60- 70%. After further 12 mm balloon angioplasty of the common iliac vein, there is further improvement in patency with only mild 20- 25% narrowing remaining. Excellent flow was present through the outflow veins on completion.  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 These were successfully treated with 10 mm and 12 mm balloon angioplasty with significant angiographic improvement, as above.  ACCESS: The graft and venous outflow remain amenable to percutaneous intervention.   Electronically Signed   By: Aletta Edouard M.D.   On: 04/16/2014 17:06   Ir Fluoro Guide Cv Line Right  04/14/2014   CLINICAL DATA:  renal failure, needs  durable venous access.  EXAM: RIGHT IJ CENTRAL VENOUS CATHETER PLACEMENT UNDER ULTRASOUND AND FLUOROSCOPIC GUIDANCE:  FLUOROSCOPY TIME:  6 min 12 seconds  TECHNIQUE: The procedure, risks (including but not limited to bleeding, infection, organ damage ), benefits, and alternatives were explained to the patient. Questions regarding the procedure were encouraged and answered. The patient understands and consents to the procedure. The patient had an indwelling ready EJ  peripheral IV, so initially left-sided approach was selected. Left neck prepped with chlorhexidine, draped in usual sterile fashion. Maximal barrier sterile technique was utilized including caps, mask, sterile gowns, sterile gloves, sterile drape, hand hygiene and skin antiseptic. Overlying skin infiltrated with 1% lidocaine. Under real-time ultrasound guidance, the left IJ vein was accessed with a 21 gauge micropuncture needle. The guidewire would not advance centrally easily. Small contrast venogram demonstrated occlusion of the central aspect of the left IJ vein. For this reason, a right-sided approach was used. Patency of the right IJ vein was confirmed with ultrasound with image documentation. An appropriate skin site was determined. Skin site was marked. Region was prepped using maximum barrier technique including cap and mask, sterile gown, sterile gloves, large sterile sheet, and Chlorhexidine as cutaneous antisepsis. The region was infiltrated locally with 1% lidocaine. Under real-time ultrasound guidance, the right IJ vein was accessed with a 21 gauge micropuncture needle; the needle tip within the vein was confirmed with ultrasound image documentation. The guidewire would not advance centrally easily. Contrast venogram demonstrated occlusion of the central aspect of the right IJ vein, with a prominent collateral channel to the SVC. With the aid of an angled Glidewire, this was catheterized. A double-lumen PICC line would not advance through the length of the tortuosity of the course. For this reason, over the Glidewire a single lumen 5 [place] power injectable PICC catheter trimmed to 18 cm was advanced, positioned with the tip in the mid right atrium. Catheter was secured externally and flow flushed per protocol. The patient tolerated the procedure well.  COMPLICATIONS: None immediate  IMPRESSION: 1. Technically successful right IJ Single lumen power injectable central venous catheter placement.    Electronically Signed   By: Arne Cleveland M.D.   On: 04/14/2014 15:10   Ir US Guide Vasc Access Left  04/16/2014   CLINICAL DATA:  renal failure, needs  durable venous access.  EXAM: RIGHT IJ CENTRAL VENOUS CATHETER PLACEMENT UNDER ULTRASOUND AND FLUOROSCOPIC GUIDANCE:  FLUOROSCOPY TIME:  6 min 12 seconds  TECHNIQUE: The procedure, risks (including but not limited to bleeding, infection, organ damage ), benefits, and alternatives were explained to the patient. Questions regarding the procedure were encouraged and answered. The patient understands and consents to the procedure. The patient had an indwelling ready EJ peripheral IV, so initially left-sided approach was selected. Left neck prepped with chlorhexidine, draped in usual sterile fashion. Maximal barrier sterile technique was utilized including caps, mask, sterile gowns, sterile gloves, sterile drape, hand hygiene and skin antiseptic. Overlying skin infiltrated with 1% lidocaine. Under real-time ultrasound guidance, the left IJ vein was accessed with a 21 gauge micropuncture needle. The guidewire would not advance centrally easily. Small contrast venogram demonstrated occlusion of the central aspect of the left IJ vein. For this reason, a right-sided approach was used. Patency of the right IJ vein was confirmed with ultrasound with image documentation. An appropriate skin site was determined. Skin site was marked. Region was prepped using maximum barrier technique including cap and mask, sterile gown, sterile gloves, large sterile sheet, and Chlorhexidine as cutaneous antisepsis. The region was infiltrated locally with 1% lidocaine. Under real-time ultrasound guidance, the right IJ vein was accessed with a 21 gauge micropuncture needle; the needle tip within the vein was confirmed with ultrasound image documentation. The guidewire would not advance centrally easily. Contrast venogram demonstrated occlusion of the central aspect of the right IJ vein,  with a prominent collateral channel to the SVC. With the aid of an angled Glidewire, this was catheterized. A double-lumen PICC line would not advance through the length of the tortuosity of the course. For this reason, over the Glidewire a single lumen 5 [place] power injectable PICC catheter trimmed to 18 cm was advanced, positioned with the tip in the mid right atrium. Catheter was secured externally and flow flushed per protocol. The patient tolerated the procedure well.  COMPLICATIONS: None immediate  IMPRESSION: 1. Technically successful right IJ Single lumen power injectable central venous catheter placement.   Electronically Signed   By: Arne Cleveland M.D.   On: 04/14/2014 15:10   Ir US Guide Vasc Access Right  04/14/2014   CLINICAL DATA:  renal failure, needs  durable venous access.  EXAM: RIGHT IJ CENTRAL VENOUS CATHETER PLACEMENT UNDER ULTRASOUND AND FLUOROSCOPIC GUIDANCE:  FLUOROSCOPY TIME:  6 min 12 seconds  TECHNIQUE: The procedure, risks (including but not limited to bleeding, infection, organ damage ), benefits, and alternatives were explained to the patient. Questions regarding the procedure were encouraged and answered. The patient understands and consents to the procedure. The patient had an indwelling ready EJ peripheral IV, so initially left-sided approach was selected. Left neck prepped with chlorhexidine, draped in usual sterile fashion. Maximal barrier sterile technique was utilized including caps, mask, sterile gowns, sterile gloves, sterile drape, hand hygiene and skin antiseptic. Overlying skin infiltrated with 1% lidocaine. Under real-time ultrasound guidance, the left IJ vein was accessed with a 21 gauge micropuncture needle. The guidewire would not advance centrally easily. Small contrast venogram demonstrated occlusion of the central aspect of the left IJ vein. For this reason, a  right-sided approach was used. Patency of the right IJ vein was confirmed with ultrasound with image  documentation. An appropriate skin site was determined. Skin site was marked. Region was prepped using maximum barrier technique including cap and mask, sterile gown, sterile gloves, large sterile sheet, and Chlorhexidine as cutaneous antisepsis. The region was infiltrated locally with 1% lidocaine. Under real-time ultrasound guidance, the right IJ vein was accessed with a 21 gauge micropuncture needle; the needle tip within the vein was confirmed with ultrasound image documentation. The guidewire would not advance centrally easily. Contrast venogram demonstrated occlusion of the central aspect of the right IJ vein, with a prominent collateral channel to the SVC. With the aid of an angled Glidewire, this was catheterized. A double-lumen PICC line would not advance through the length of the tortuosity of the course. For this reason, over the Glidewire a single lumen 5 [place] power injectable PICC catheter trimmed to 18 cm was advanced, positioned with the tip in the mid right atrium. Catheter was secured externally and flow flushed per protocol. The patient tolerated the procedure well.  COMPLICATIONS: None immediate  IMPRESSION: 1. Technically successful right IJ Single lumen power injectable central venous catheter placement.   Electronically Signed   By: Arne Cleveland M.D.   On: 04/14/2014 15:10   Dg Chest Port 1 View  04/13/2014   CLINICAL DATA:  Cough, shortness of breath and asthma.  EXAM: PORTABLE CHEST - 1 VIEW  COMPARISON:  01/26/2014  FINDINGS: The cardiac silhouette, mediastinal and hilar contours are stable. Persistent diffuse airspace process with slight further right lower lobe consolidation. Probable small pleural effusions. No pneumothorax.  IMPRESSION: Persistent diffuse airspace process with slight increase in right lower lobe consolidation.   Electronically Signed   By: Kalman Jewels M.D.   On: 04/13/2014 07:42   Ir Shuntogram/ Fistulagram Right Mod Sed  04/16/2014   CLINICAL DATA:   Pain overlying right thigh dialysis graft with right lower extremity edema. 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  EXAM: 1.  DIALYSIS AV SHUNTOGRAM  2.  ANGIOPLASTY OF RIGHT EXTERNAL AND COMMON ILIAC VEINS  COMPARISON:  None  CONTRAST:  50 mL Omnipaque 300  FLUOROSCOPY TIME:  1 min and 54 seconds  MEDICATIONS: 50 mcg IV fentanyl  PROCEDURE: The procedure, risks, benefits, and alternatives were explained to the patient. Questions regarding the procedure were encouraged and answered. The patient understands and consents to the procedure.  The right thigh dialysis graft was prepped with Betadine in a sterile fashion, and a sterile drape was applied covering the operative field. A diagnostic shunt study was performed via an 18 gauge angiocatheter introduced into venous outflow. Venous drainage was assessed to the level of the central veins in the chest. Proximal shunt was studied by reflux maneuver with temporary compression of venous outflow.  The angiocath was removed and replaced with a 7-French sheath over a guidewire. Balloon angioplasty of the right external iliac vein was then performed with a 10 mm x 4 cm Conquest balloon. Angiography was performed.  Additional balloon angioplasty was performed of the right common iliac vein with a 12 mm x 4 cm Atlas balloon.  Balloon angioplasty was followed by additional angiography. The sheath was removed and hemostasis obtained with application of a 2-0 Ethilon pursestring suture.  COMPLICATIONS: None  FINDINGS: The in loop graft in the right thigh is normally patent including arterial and venous anastomoses. The graft itself shows small focal pseudoaneurysms related to prior puncture sites. Venous  outflow demonstrates focal stenosis of the distal external iliac vein of approximately 80%. Longer segmental stenosis of the common iliac vein approaches 80% in estimated caliber. The inferior vena cava is widely patent.  After 10 mm balloon angioplasty,  there was improvement in appearance of the external iliac vein stenosis with mild residual roughly 25% stenosis present. There remained moderate degree of common iliac vein narrowing of approximately 60- 70%. After further 12 mm balloon angioplasty of the common iliac vein, there is further improvement in patency with only mild 20- 25% narrowing remaining. Excellent flow was present through the outflow veins on completion.  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 These were successfully treated with 10 mm and 12 mm balloon angioplasty with significant angiographic improvement, as above.  ACCESS: The graft and venous outflow remain amenable to percutaneous intervention.   Electronically Signed   By: Aletta Edouard M.D.   On: 04/16/2014 17:06         Subjective:   Objective: Filed Vitals:   04/21/14 1100 04/21/14 1118 04/21/14 1224 04/21/14 1657  BP: 104/63 115/63 121/65 141/70  Pulse: 88 80 89 94  Temp:  98.3 F (36.8 C) 97.9 F (36.6 C) 98.2 F (36.8 C)  TempSrc:  Oral Oral Oral  Resp:  18 20 20   Height:      Weight:  67.6 kg (149 lb 0.5 oz)    SpO2:  96% 100% 100%    Intake/Output Summary (Last 24 hours) at 04/21/14 1838 Last data filed at 04/21/14 1118  Gross per 24 hour  Intake   1315 ml  Output   2000 ml  Net   -685 ml   Weight change: -1.439 kg (-3 lb 2.8 oz) Exam:   General:  Pt is alert, follows commands appropriately, not in acute distress  HEENT: No icterus, No thrush,  [place]/AT  Cardiovascular: RRR, S1/S2, no rubs, no gallops  Respiratory: CTA bilaterally, no wheezing, no crackles, no rhonchi  Abdomen: Soft/+BS, non tender, non distended, no guarding  Extremities: trace LE edema, No lymphangitis, No petechiae, No rashes, no synovitis  Data Reviewed: Basic Metabolic Panel:  Recent Labs Lab 04/15/14 0405 04/15/14 1520  04/16/14 0620 04/17/14 0535 04/18/14 0446 04/19/14 0725  04/21/14 0457  NA 127* 136*  < > 136* 135* 136* 134* 136*  K 5.3 3.6*  < > 3.7 4.3 3.9 4.4 4.3  CL 85* 94*  < > 96 92* 95* 94* 96  CO2 16* 27  < > 26 27 26 27 29   GLUCOSE 765* 128*  < > 231* 266* 133* 162* 88  BUN 48* 12  < > 16 25* 19 35* 40*  CREATININE 8.70* 3.78*  < > 5.72* 9.35* 6.55* 9.41* 8.56*  CALCIUM 7.9* 9.0  < > 8.6 9.1 9.2 9.5 10.0  MG  --  1.9  --   --   --   --   --   --   PHOS 3.3  --   --   --   --   --  2.8 2.1*  < > = values in this interval not displayed. Liver Function Tests:  Recent Labs Lab 04/15/14 0405 04/19/14 0725 04/21/14 0457  ALBUMIN 2.5* 2.8* 2.8*   No results for input(s): LIPASE, AMYLASE in the last 168 hours. No results for input(s): AMMONIA in the last 168 hours. CBC:  Recent Labs Lab 04/15/14 0405 04/18/14 0446 04/19/14 0725 04/21/14 0457  WBC  5.5 4.5 6.0 4.5  HGB 8.5* 9.1* 9.3* 9.1*  HCT 27.1* 28.8* 29.3* 28.8*  MCV 98.2 96.6 97.7 100.7*  PLT 158 167 188 202   Cardiac Enzymes: No results for input(s): CKTOTAL, CKMB, CKMBINDEX, TROPONINI in the last 168 hours. BNP: Invalid input(s): POCBNP CBG:  Recent Labs Lab 04/20/14 1207 04/20/14 1641 04/20/14 2227 04/21/14 1223 04/21/14 1655  GLUCAP 183* 90 120* 168* 337*    Recent Results (from the past 240 hour(s))  Blood culture (routine x 2)     Status: None   Collection Time: 04/13/14  8:25 AM  Result Value Ref Range Status   Specimen Description BLOOD RIGHT HAND  Final   Special Requests BOTTLES DRAWN AEROBIC ONLY 2MLS  Final   Culture  Setup Time   Final    04/13/2014 14:04 Performed at [facility]    Culture   Final    NO GROWTH 5 DAYS Performed at [facility]    Report Status 04/19/2014 FINAL  Final  Blood culture (routine x 2)     Status: None   Collection Time: 04/13/14  8:35 AM  Result Value Ref Range Status   Specimen Description BLOOD RIGHT NECK  Final   Special Requests   Final    BOTTLES DRAWN AEROBIC AND ANAEROBIC 5MLS RIGHT EJ LINES     Culture  Setup Time   Final    04/13/2014 14:04 Performed at [facility]    Culture   Final    NO GROWTH 5 DAYS Performed at [facility]    Report Status 04/19/2014 FINAL  Final  MRSA PCR Screening     Status: None   Collection Time: 04/13/14  3:32 PM  Result Value Ref Range Status   MRSA by PCR NEGATIVE NEGATIVE Final    Comment:        The GeneXpert MRSA Assay (FDA approved for NASAL specimens only), is one component of a comprehensive MRSA colonization surveillance program. It is not intended to diagnose MRSA infection nor to guide or monitor treatment for MRSA infections.   Culture, routine-abscess     Status: None   Collection Time: 04/14/14  3:03 PM  Result Value Ref Range Status   Specimen Description ABSCESS LYMPH NODE  Final   Special Requests NONE  Final   Gram Stain   Final    RARE WBC PRESENT,BOTH PMN AND MONONUCLEAR NO SQUAMOUS EPITHELIAL CELLS SEEN NO ORGANISMS SEEN Performed at [facility]    Culture   Final    NO GROWTH 3 DAYS Performed at [facility]    Report Status 04/17/2014 FINAL  Final     Scheduled Meds: . aspirin  325 mg Oral Daily  . calcium acetate  2,001 mg Oral TID WC  . Darbepoetin Alfa      . [START ON 04/29/2014] darbepoetin (ARANESP) injection - DIALYSIS  60 mcg Intravenous Q Thu-HD  . doxercalciferol      . doxercalciferol  2 mcg Intravenous Q T,Th,Sa-HD  . feeding supplement (NEPRO CARB STEADY)  237 mL Oral BID BM  . ferric gluconate (FERRLECIT/NULECIT) IV  62.5 mg Intravenous Q Thu-HD  . gabapentin  100 mg Oral Daily  . heparin  5,000 Units Subcutaneous 3 times per day  . insulin aspart  0-5 Units Subcutaneous QHS  . insulin aspart  0-9 Units Subcutaneous TID WC  . insulin glargine  7 Units Subcutaneous BID WC  . loratadine  10 mg Oral Daily  .  montelukast  10 mg Oral QHS  . multivitamin  1 tablet Oral Daily  . pantoprazole  40 mg Oral QHS  . simvastatin  20 mg Oral QHS  . sodium  chloride  10-40 mL Intracatheter Q12H  . sodium chloride  3 mL Intravenous Q12H   Continuous Infusions: . sodium chloride 10 mL/hr at 04/19/14 1246     Kalayla Shadden, DO  [facility] Pager [phone]  If 7PM-7AM, please contact night-coverage [email or web address] Password [facility] 04/21/2014, 6:38 PM   LOS: 8 days

## 2014-04-22 DIAGNOSIS — R05 Cough: Secondary | ICD-10-CM

## 2014-04-22 DIAGNOSIS — R059 Cough, unspecified: Secondary | ICD-10-CM | POA: Insufficient documentation

## 2014-04-22 LAB — GLUCOSE, CAPILLARY
GLUCOSE-CAPILLARY: 110 mg/dL — AB (ref 70–99)
Glucose-Capillary: 52 mg/dL — ABNORMAL LOW (ref 70–99)
Glucose-Capillary: 60 mg/dL — ABNORMAL LOW (ref 70–99)
Glucose-Capillary: 303 mg/dL — ABNORMAL HIGH (ref 70–99)
Glucose-Capillary: 187 mg/dL — ABNORMAL HIGH (ref 70–99)
Glucose-Capillary: 218 mg/dL — ABNORMAL HIGH (ref 70–99)

## 2014-04-22 MED ORDER — DEXTROSE 50 % IV SOLN
INTRAVENOUS | Status: AC
  Filled 2014-04-22: qty 50

## 2014-04-22 MED ORDER — SEVELAMER CARBONATE 800 MG PO TABS: 1600.0000 mg | ORAL_TABLET | Freq: Three times a day (TID) | ORAL | Status: DC

## 2014-04-22 MED ORDER — DIPHENHYDRAMINE HCL 50 MG/ML IJ SOLN
12.5000 mg | Freq: Once | INTRAMUSCULAR | Status: AC
  Administered 2014-04-22: 12.5 mg via INTRAVENOUS
  Filled 2014-04-22: qty 1

## 2014-04-22 NOTE — Progress Notes (Signed)
CBG 187 

## 2014-04-22 NOTE — Progress Notes (Signed)
PROGRESS NOTE  TENIQUA MARRON ZOX:[id number] DOB: [birth year] DOA: 04/13/2014 PCP: [name], MD  Assessment/Plan: Acute respiratory failure with hypoxia due to volume overload -resolved -> 3900 cc removed via UF 11/17 with 2100 cc removed 11/19 -no clinical evidence to suggest PNA  -weaned off oxygen   ESRD (end stage renal disease) -per Nephro  -cont HD -shuntogram completed per IR 11/20--widely patent R-thigh AV graft -Right IJ PICC line will need to be removed prior to discharge-placed by IR--04/14/2014  L pons infarct/CVA -with R arm numbness and weakness, improving -on Neuro exam, mild weakness and sensory deficits on R and diminished grip  -MRI was completed 11/22 which showed 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 -Not a TPA candidate due to being outside the window patient reported symptoms 4 days after the fact-- -MRA brain negative -Now on ASA 325mg  and on low dose statin -2D ECHo with normal EF, no cardiac source of embolus, Carotid duplex with 1-39% stenosis -hbaic 11.4 in July'14, LDL 83 -PT/Ot eval completed, home health services set up   DKA (diabetic ketoacidoses) and hypoglycemia -onset after admit and required D10 -11/18 CBG was > 300 on D10 so dc'd D10 and was transitioned to Lantus but at 1/2 home dose since she is usually very brittle -overnight into 11/19 went back into DKA (glucose 765, AG 26) so back on insulin drip  -pm of 11/19 transitioned back to Lantus, then had hypoglycemia, now resolved, monitor -has a well established hx of brittle DM. -add bed time snack as pt has had mild morning hypoglycemia   GERD/Gastroparesis -cont PPI  RLS (restless legs syndrome)   Non compliance  Family Communication:   Pt at beside Disposition Plan:   Home 04/23/14 once issues settled with pt's hotel       Procedures/Studies: Dg Chest 2 View  04/14/2014   CLINICAL DATA:  Evaluate for possible pneumonia, shortness of  breath, productive cuff  EXAM: CHEST  2 VIEW  COMPARISON:  04/13/2014  FINDINGS: Cardiomediastinal silhouette is stable. There is improvement in aeration without convincing pulmonary edema or segmental infiltrate. Residual mild interstitial prominence bilateral upper lobes. Bony thorax is unremarkable.  IMPRESSION: Improvement in aeration without convincing pulmonary edema. No segmental infiltrate. Residual mild interstitial prominence bilateral upper lobes.   Electronically Signed   By: Lahoma Crocker M.D.   On: 04/14/2014 12:05   Mr [name] Contrast  04/18/2014   CLINICAL DATA:  43 year old female with acute numbness and tingling in the right side extremity for 5 days. Initial encounter.  EXAM: MRI HEAD WITHOUT CONTRAST  MRA HEAD WITHOUT CONTRAST  TECHNIQUE: Multiplanar, multiecho pulse sequences of the brain and surrounding structures were obtained without intravenous contrast. Angiographic images of the head were obtained using MRA technique without contrast.  COMPARISON:  Non contrast head CTs 06/27/2013 and earlier.  FINDINGS: MRI HEAD FINDINGS  6 mm focus of restricted diffusion in the dorsal pons on the left (series 4, image 9). Associated mild T2 and FLAIR hyperintensity. No associated mass effect or hemorrhage.  No other restricted diffusion. Major intracranial vascular flow voids are preserved.  No midline shift, mass effect, evidence of mass lesion, ventriculomegaly, extra-axial collection or acute intracranial hemorrhage. Cervicomedullary junction and pituitary are within normal limits. Chronic lacunar been *SCRATCH* sec chronic lacunar infarct in the left globus pallidus. Mild nonspecific mostly periventricular cerebral white matter T2 and FLAIR hyperintensity possible chronic micro hemorrhages in the anterior left  frontal lobe (series 8, image 15). No cortical encephalomalacia. Cerebellum within normal limits. Visible internal auditory structures appear normal.  Trace left mastoid effusion.  Negative visualized nasopharynx. Other Visualized paranasal sinuses and mastoids are clear. Visualized orbit soft tissues are within normal limits. Visualized scalp soft tissues are within normal limits.  Nonspecific decreased T1 bone marrow signal throughout.  MRA HEAD FINDINGS  Antegrade flow in the posterior circulation. Codominant distal vertebral arteries. Dominant, normal AICA origins. No basilar stenosis. SCA and PCA origins are normal. Normal posterior communicating arteries. Bilateral PCA branches are normal.  Antegrade flow in both ICA siphons. Tortuous distal cervical ICAs, more so the right 8. No siphon stenosis. Ophthalmic and posterior communicating artery origins are within normal limits. Normal carotid termini, MCA and ACA origins.  Anterior communicating artery and visualized bilateral ACA and MCA branches are within normal limits.  IMPRESSION: 1. Acute 6 mm lacunar infarct in the left dorsal pons. No mass effect or hemorrhage. 2. Other evidence of chronic small vessel disease including left globus pallidus chronic lacune, possible chronic micro hemorrhages left frontal lobe. 3. Negative intracranial MRA, but tortuous cervical ICAs raising the possibility of chronic hypertension.   Electronically Signed   By: Lars Pinks M.D.   On: 04/18/2014 11:27   Mr [name] Contrast  04/18/2014   CLINICAL DATA:  43 year old female with acute numbness and tingling in the right side extremity for 5 days. Initial encounter.  EXAM: MRI HEAD WITHOUT CONTRAST  MRA HEAD WITHOUT CONTRAST  TECHNIQUE: Multiplanar, multiecho pulse sequences of the brain and surrounding structures were obtained without intravenous contrast. Angiographic images of the head were obtained using MRA technique without contrast.  COMPARISON:  Non contrast head CTs 06/27/2013 and earlier.  FINDINGS: MRI HEAD FINDINGS  6 mm focus of restricted diffusion in the dorsal pons on the left (series 4, image 9). Associated mild T2 and FLAIR  hyperintensity. No associated mass effect or hemorrhage.  No other restricted diffusion. Major intracranial vascular flow voids are preserved.  No midline shift, mass effect, evidence of mass lesion, ventriculomegaly, extra-axial collection or acute intracranial hemorrhage. Cervicomedullary junction and pituitary are within normal limits. Chronic lacunar been *SCRATCH* sec chronic lacunar infarct in the left globus pallidus. Mild nonspecific mostly periventricular cerebral white matter T2 and FLAIR hyperintensity possible chronic micro hemorrhages in the anterior left frontal lobe (series 8, image 15). No cortical encephalomalacia. Cerebellum within normal limits. Visible internal auditory structures appear normal.  Trace left mastoid effusion. Negative visualized nasopharynx. Other Visualized paranasal sinuses and mastoids are clear. Visualized orbit soft tissues are within normal limits. Visualized scalp soft tissues are within normal limits.  Nonspecific decreased T1 bone marrow signal throughout.  MRA HEAD FINDINGS  Antegrade flow in the posterior circulation. Codominant distal vertebral arteries. Dominant, normal AICA origins. No basilar stenosis. SCA and PCA origins are normal. Normal posterior communicating arteries. Bilateral PCA branches are normal.  Antegrade flow in both ICA siphons. Tortuous distal cervical ICAs, more so the right 8. No siphon stenosis. Ophthalmic and posterior communicating artery origins are within normal limits. Normal carotid termini, MCA and ACA origins.  Anterior communicating artery and visualized bilateral ACA and MCA branches are within normal limits.  IMPRESSION: 1. Acute 6 mm lacunar infarct in the left dorsal pons. No mass effect or hemorrhage. 2. Other evidence of chronic small vessel disease including left globus pallidus chronic lacune, possible chronic micro hemorrhages left frontal lobe. 3. Negative intracranial MRA, but tortuous cervical ICAs raising the possibility of  chronic hypertension.   Electronically Signed   By: Lars Pinks M.D.   On: 04/18/2014 11:27   Ir Veno/jugular Left  04/16/2014   CLINICAL DATA:  renal failure, needs  durable venous access.  EXAM: RIGHT IJ CENTRAL VENOUS CATHETER PLACEMENT UNDER ULTRASOUND AND FLUOROSCOPIC GUIDANCE:  FLUOROSCOPY TIME:  6 min 12 seconds  TECHNIQUE: The procedure, risks (including but not limited to bleeding, infection, organ damage ), benefits, and alternatives were explained to the patient. Questions regarding the procedure were encouraged and answered. The patient understands and consents to the procedure. The patient had an indwelling ready EJ peripheral IV, so initially left-sided approach was selected. Left neck prepped with chlorhexidine, draped in usual sterile fashion. Maximal barrier sterile technique was utilized including caps, mask, sterile gowns, sterile gloves, sterile drape, hand hygiene and skin antiseptic. Overlying skin infiltrated with 1% lidocaine. Under real-time ultrasound guidance, the left IJ vein was accessed with a 21 gauge micropuncture needle. The guidewire would not advance centrally easily. Small contrast venogram demonstrated occlusion of the central aspect of the left IJ vein. For this reason, a right-sided approach was used. Patency of the right IJ vein was confirmed with ultrasound with image documentation. An appropriate skin site was determined. Skin site was marked. Region was prepped using maximum barrier technique including cap and mask, sterile gown, sterile gloves, large sterile sheet, and Chlorhexidine as cutaneous antisepsis. The region was infiltrated locally with 1% lidocaine. Under real-time ultrasound guidance, the right IJ vein was accessed with a 21 gauge micropuncture needle; the needle tip within the vein was confirmed with ultrasound image documentation. The guidewire would not advance centrally easily. Contrast venogram demonstrated occlusion of the central aspect of the right IJ  vein, with a prominent collateral channel to the SVC. With the aid of an angled Glidewire, this was catheterized. A double-lumen PICC line would not advance through the length of the tortuosity of the course. For this reason, over the Glidewire a single lumen 5 [place] power injectable PICC catheter trimmed to 18 cm was advanced, positioned with the tip in the mid right atrium. Catheter was secured externally and flow flushed per protocol. The patient tolerated the procedure well.  COMPLICATIONS: None immediate  IMPRESSION: 1. Technically successful right IJ Single lumen power injectable central venous catheter placement.   Electronically Signed   By: Arne Cleveland M.D.   On: 04/14/2014 15:10   Ir Pta Venous Right  04/16/2014   CLINICAL DATA:  Pain overlying right thigh dialysis graft with right lower extremity edema. 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  EXAM: 1.  DIALYSIS AV SHUNTOGRAM  2.  ANGIOPLASTY OF RIGHT EXTERNAL AND COMMON ILIAC VEINS  COMPARISON:  None  CONTRAST:  50 mL Omnipaque 300  FLUOROSCOPY TIME:  1 min and 54 seconds  MEDICATIONS: 50 mcg IV fentanyl  PROCEDURE: The procedure, risks, benefits, and alternatives were explained to the patient. Questions regarding the procedure were encouraged and answered. The patient understands and consents to the procedure.  The right thigh dialysis graft was prepped with Betadine in a sterile fashion, and a sterile drape was applied covering the operative field. A diagnostic shunt study was performed via an 18 gauge angiocatheter introduced into venous outflow. Venous drainage was assessed to the level of the central veins in the chest. Proximal shunt was studied by reflux maneuver with temporary compression of venous outflow.  The angiocath was removed and replaced with a 7-French sheath over a guidewire. Balloon angioplasty of the  right external iliac vein was then performed with a 10 mm x 4 cm Conquest balloon. Angiography was  performed.  Additional balloon angioplasty was performed of the right common iliac vein with a 12 mm x 4 cm Atlas balloon.  Balloon angioplasty was followed by additional angiography. The sheath was removed and hemostasis obtained with application of a 2-0 Ethilon pursestring suture.  COMPLICATIONS: None  FINDINGS: The in loop graft in the right thigh is normally patent including arterial and venous anastomoses. The graft itself shows small focal pseudoaneurysms related to prior puncture sites. Venous outflow demonstrates focal stenosis of the distal external iliac vein of approximately 80%. Longer segmental stenosis of the common iliac vein approaches 80% in estimated caliber. The inferior vena cava is widely patent.  After 10 mm balloon angioplasty, there was improvement in appearance of the external iliac vein stenosis with mild residual roughly 25% stenosis present. There remained moderate degree of common iliac vein narrowing of approximately 60- 70%. After further 12 mm balloon angioplasty of the common iliac vein, there is further improvement in patency with only mild 20- 25% narrowing remaining. Excellent flow was present through the outflow veins on completion.  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 These were successfully treated with 10 mm and 12 mm balloon angioplasty with significant angiographic improvement, as above.  ACCESS: The graft and venous outflow remain amenable to percutaneous intervention.   Electronically Signed   By: Aletta Edouard M.D.   On: 04/16/2014 17:06   Ir Fluoro Guide Cv Line Right  04/14/2014   CLINICAL DATA:  renal failure, needs  durable venous access.  EXAM: RIGHT IJ CENTRAL VENOUS CATHETER PLACEMENT UNDER ULTRASOUND AND FLUOROSCOPIC GUIDANCE:  FLUOROSCOPY TIME:  6 min 12 seconds  TECHNIQUE: The procedure, risks (including but not limited to bleeding, infection, organ damage ), benefits, and  alternatives were explained to the patient. Questions regarding the procedure were encouraged and answered. The patient understands and consents to the procedure. The patient had an indwelling ready EJ peripheral IV, so initially left-sided approach was selected. Left neck prepped with chlorhexidine, draped in usual sterile fashion. Maximal barrier sterile technique was utilized including caps, mask, sterile gowns, sterile gloves, sterile drape, hand hygiene and skin antiseptic. Overlying skin infiltrated with 1% lidocaine. Under real-time ultrasound guidance, the left IJ vein was accessed with a 21 gauge micropuncture needle. The guidewire would not advance centrally easily. Small contrast venogram demonstrated occlusion of the central aspect of the left IJ vein. For this reason, a right-sided approach was used. Patency of the right IJ vein was confirmed with ultrasound with image documentation. An appropriate skin site was determined. Skin site was marked. Region was prepped using maximum barrier technique including cap and mask, sterile gown, sterile gloves, large sterile sheet, and Chlorhexidine as cutaneous antisepsis. The region was infiltrated locally with 1% lidocaine. Under real-time ultrasound guidance, the right IJ vein was accessed with a 21 gauge micropuncture needle; the needle tip within the vein was confirmed with ultrasound image documentation. The guidewire would not advance centrally easily. Contrast venogram demonstrated occlusion of the central aspect of the right IJ vein, with a prominent collateral channel to the SVC. With the aid of an angled Glidewire, this was catheterized. A double-lumen PICC line would not advance through the length of the tortuosity of the course. For this reason, over the Glidewire a single lumen 5 [place] power injectable PICC catheter trimmed to 18 cm  was advanced, positioned with the tip in the mid right atrium. Catheter was secured externally and flow flushed per  protocol. The patient tolerated the procedure well.  COMPLICATIONS: None immediate  IMPRESSION: 1. Technically successful right IJ Single lumen power injectable central venous catheter placement.   Electronically Signed   By: Arne Cleveland M.D.   On: 04/14/2014 15:10   Ir US Guide Vasc Access Left  04/16/2014   CLINICAL DATA:  renal failure, needs  durable venous access.  EXAM: RIGHT IJ CENTRAL VENOUS CATHETER PLACEMENT UNDER ULTRASOUND AND FLUOROSCOPIC GUIDANCE:  FLUOROSCOPY TIME:  6 min 12 seconds  TECHNIQUE: The procedure, risks (including but not limited to bleeding, infection, organ damage ), benefits, and alternatives were explained to the patient. Questions regarding the procedure were encouraged and answered. The patient understands and consents to the procedure. The patient had an indwelling ready EJ peripheral IV, so initially left-sided approach was selected. Left neck prepped with chlorhexidine, draped in usual sterile fashion. Maximal barrier sterile technique was utilized including caps, mask, sterile gowns, sterile gloves, sterile drape, hand hygiene and skin antiseptic. Overlying skin infiltrated with 1% lidocaine. Under real-time ultrasound guidance, the left IJ vein was accessed with a 21 gauge micropuncture needle. The guidewire would not advance centrally easily. Small contrast venogram demonstrated occlusion of the central aspect of the left IJ vein. For this reason, a right-sided approach was used. Patency of the right IJ vein was confirmed with ultrasound with image documentation. An appropriate skin site was determined. Skin site was marked. Region was prepped using maximum barrier technique including cap and mask, sterile gown, sterile gloves, large sterile sheet, and Chlorhexidine as cutaneous antisepsis. The region was infiltrated locally with 1% lidocaine. Under real-time ultrasound guidance, the right IJ vein was accessed with a 21 gauge micropuncture needle; the needle tip within  the vein was confirmed with ultrasound image documentation. The guidewire would not advance centrally easily. Contrast venogram demonstrated occlusion of the central aspect of the right IJ vein, with a prominent collateral channel to the SVC. With the aid of an angled Glidewire, this was catheterized. A double-lumen PICC line would not advance through the length of the tortuosity of the course. For this reason, over the Glidewire a single lumen 5 [place] power injectable PICC catheter trimmed to 18 cm was advanced, positioned with the tip in the mid right atrium. Catheter was secured externally and flow flushed per protocol. The patient tolerated the procedure well.  COMPLICATIONS: None immediate  IMPRESSION: 1. Technically successful right IJ Single lumen power injectable central venous catheter placement.   Electronically Signed   By: Arne Cleveland M.D.   On: 04/14/2014 15:10   Ir US Guide Vasc Access Right  04/14/2014   CLINICAL DATA:  renal failure, needs  durable venous access.  EXAM: RIGHT IJ CENTRAL VENOUS CATHETER PLACEMENT UNDER ULTRASOUND AND FLUOROSCOPIC GUIDANCE:  FLUOROSCOPY TIME:  6 min 12 seconds  TECHNIQUE: The procedure, risks (including but not limited to bleeding, infection, organ damage ), benefits, and alternatives were explained to the patient. Questions regarding the procedure were encouraged and answered. The patient understands and consents to the procedure. The patient had an indwelling ready EJ peripheral IV, so initially left-sided approach was selected. Left neck prepped with chlorhexidine, draped in usual sterile fashion. Maximal barrier sterile technique was utilized including caps, mask, sterile gowns, sterile gloves, sterile drape, hand hygiene and skin antiseptic. Overlying skin infiltrated with 1% lidocaine. Under real-time ultrasound guidance, the left IJ vein was accessed  with a 21 gauge micropuncture needle. The guidewire would not advance centrally easily. Small contrast  venogram demonstrated occlusion of the central aspect of the left IJ vein. For this reason, a right-sided approach was used. Patency of the right IJ vein was confirmed with ultrasound with image documentation. An appropriate skin site was determined. Skin site was marked. Region was prepped using maximum barrier technique including cap and mask, sterile gown, sterile gloves, large sterile sheet, and Chlorhexidine as cutaneous antisepsis. The region was infiltrated locally with 1% lidocaine. Under real-time ultrasound guidance, the right IJ vein was accessed with a 21 gauge micropuncture needle; the needle tip within the vein was confirmed with ultrasound image documentation. The guidewire would not advance centrally easily. Contrast venogram demonstrated occlusion of the central aspect of the right IJ vein, with a prominent collateral channel to the SVC. With the aid of an angled Glidewire, this was catheterized. A double-lumen PICC line would not advance through the length of the tortuosity of the course. For this reason, over the Glidewire a single lumen 5 [place] power injectable PICC catheter trimmed to 18 cm was advanced, positioned with the tip in the mid right atrium. Catheter was secured externally and flow flushed per protocol. The patient tolerated the procedure well.  COMPLICATIONS: None immediate  IMPRESSION: 1. Technically successful right IJ Single lumen power injectable central venous catheter placement.   Electronically Signed   By: Arne Cleveland M.D.   On: 04/14/2014 15:10   Dg Chest Port 1 View  04/13/2014   CLINICAL DATA:  Cough, shortness of breath and asthma.  EXAM: PORTABLE CHEST - 1 VIEW  COMPARISON:  01/26/2014  FINDINGS: The cardiac silhouette, mediastinal and hilar contours are stable. Persistent diffuse airspace process with slight further right lower lobe consolidation. Probable small pleural effusions. No pneumothorax.  IMPRESSION: Persistent diffuse airspace process with slight  increase in right lower lobe consolidation.   Electronically Signed   By: Kalman Jewels M.D.   On: 04/13/2014 07:42   Ir Shuntogram/ Fistulagram Right Mod Sed  04/16/2014   CLINICAL DATA:  Pain overlying right thigh dialysis graft with right lower extremity edema. 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  EXAM: 1.  DIALYSIS AV SHUNTOGRAM  2.  ANGIOPLASTY OF RIGHT EXTERNAL AND COMMON ILIAC VEINS  COMPARISON:  None  CONTRAST:  50 mL Omnipaque 300  FLUOROSCOPY TIME:  1 min and 54 seconds  MEDICATIONS: 50 mcg IV fentanyl  PROCEDURE: The procedure, risks, benefits, and alternatives were explained to the patient. Questions regarding the procedure were encouraged and answered. The patient understands and consents to the procedure.  The right thigh dialysis graft was prepped with Betadine in a sterile fashion, and a sterile drape was applied covering the operative field. A diagnostic shunt study was performed via an 18 gauge angiocatheter introduced into venous outflow. Venous drainage was assessed to the level of the central veins in the chest. Proximal shunt was studied by reflux maneuver with temporary compression of venous outflow.  The angiocath was removed and replaced with a 7-French sheath over a guidewire. Balloon angioplasty of the right external iliac vein was then performed with a 10 mm x 4 cm Conquest balloon. Angiography was performed.  Additional balloon angioplasty was performed of the right common iliac vein with a 12 mm x 4 cm Atlas balloon.  Balloon angioplasty was followed by additional angiography. The sheath was removed and hemostasis obtained with application of a 2-0 Ethilon pursestring suture.  COMPLICATIONS: None  FINDINGS: The in loop graft in the right thigh is normally patent including arterial and venous anastomoses. The graft itself shows small focal pseudoaneurysms related to prior puncture sites. Venous outflow demonstrates focal stenosis of the distal external  iliac vein of approximately 80%. Longer segmental stenosis of the common iliac vein approaches 80% in estimated caliber. The inferior vena cava is widely patent.  After 10 mm balloon angioplasty, there was improvement in appearance of the external iliac vein stenosis with mild residual roughly 25% stenosis present. There remained moderate degree of common iliac vein narrowing of approximately 60- 70%. After further 12 mm balloon angioplasty of the common iliac vein, there is further improvement in patency with only mild 20- 25% narrowing remaining. Excellent flow was present through the outflow veins on completion.  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 These were successfully treated with 10 mm and 12 mm balloon angioplasty with significant angiographic improvement, as above.  ACCESS: The graft and venous outflow remain amenable to percutaneous intervention.   Electronically Signed   By: Aletta Edouard M.D.   On: 04/16/2014 17:06         Subjective:  patient continues to complain of a headache in the bifrontal area. Denies any visual disturbance, new extremity weakness, dysarthria. Denies any fevers, chills, chest pain, shortness breath, nausea, vomiting, diarrhea.  Objective: Filed Vitals:   04/21/14 1657 04/21/14 2112 04/22/14 0441 04/22/14 0933  BP: 141/70 108/70 131/67 150/67  Pulse: 94 96 90 95  Temp: 98.2 F (36.8 C) 98 F (36.7 C) 98 F (36.7 C) 98.6 F (37 C)  TempSrc: Oral   Oral  Resp: 20 18 20 21   Height:      Weight:  69.4 kg (153 lb)    SpO2: 100% 99% 98% 100%    Intake/Output Summary (Last 24 hours) at 04/22/14 1529 Last data filed at 04/22/14 0900  Gross per 24 hour  Intake    465 ml  Output      0 ml  Net    465 ml   Weight change: -1.161 kg (-2 lb 9 oz) Exam:   General:  Pt is alert, follows commands appropriately, not in acute distress  HEENT: No icterus, No thrush, ,  [employer]/AT  Cardiovascular: RRR, S1/S2, no rubs, no gallops  Respiratory: CTA bilaterally, no wheezing, no crackles, no rhonchi  Abdomen: Soft/+BS, non tender, non distended, no guarding  Extremities: No edema, No lymphangitis, No petechiae, No rashes, no synovitis  Data Reviewed: Basic Metabolic Panel:  Recent Labs Lab 04/16/14 0620 04/17/14 0535 04/18/14 0446 04/19/14 0725 04/21/14 0457  NA 136* 135* 136* 134* 136*  K 3.7 4.3 3.9 4.4 4.3  CL 96 92* 95* 94* 96  CO2 26 27 26 27 29   GLUCOSE 231* 266* 133* 162* 88  BUN 16 25* 19 35* 40*  CREATININE 5.72* 9.35* 6.55* 9.41* 8.56*  CALCIUM 8.6 9.1 9.2 9.5 10.0  PHOS  --   --   --  2.8 2.1*   Liver Function Tests:  Recent Labs Lab 04/19/14 0725 04/21/14 0457  ALBUMIN 2.8* 2.8*   No results for input(s): LIPASE, AMYLASE in the last 168 hours. No results for input(s): AMMONIA in the last 168 hours. CBC:  Recent Labs Lab 04/18/14 0446 04/19/14 0725 04/21/14 0457  WBC 4.5 6.0 4.5  HGB 9.1* 9.3* 9.1*  HCT 28.8* 29.3* 28.8*  MCV 96.6 97.7 100.7*  PLT 167 188 202  Cardiac Enzymes: No results for input(s): CKTOTAL, CKMB, CKMBINDEX, TROPONINI in the last 168 hours. BNP: Invalid input(s): POCBNP CBG:  Recent Labs Lab 04/21/14 2110 04/22/14 0734 04/22/14 0803 04/22/14 0846 04/22/14 1127  GLUCAP 133* 52* 60* 110* 303*    Recent Results (from the past 240 hour(s))  Blood culture (routine x 2)     Status: None   Collection Time: 04/13/14  8:25 AM  Result Value Ref Range Status   Specimen Description BLOOD RIGHT HAND  Final   Special Requests BOTTLES DRAWN AEROBIC ONLY 2MLS  Final   Culture  Setup Time   Final    04/13/2014 14:04 Performed at [facility]    Culture   Final    NO GROWTH 5 DAYS Performed at [facility]    Report Status 04/19/2014 FINAL  Final  Blood culture (routine x 2)     Status: None   Collection Time: 04/13/14  8:35 AM  Result Value Ref Range Status   Specimen  Description BLOOD RIGHT NECK  Final   Special Requests   Final    BOTTLES DRAWN AEROBIC AND ANAEROBIC 5MLS RIGHT EJ LINES    Culture  Setup Time   Final    04/13/2014 14:04 Performed at [facility]    Culture   Final    NO GROWTH 5 DAYS Performed at [facility]    Report Status 04/19/2014 FINAL  Final  MRSA PCR Screening     Status: None   Collection Time: 04/13/14  3:32 PM  Result Value Ref Range Status   MRSA by PCR NEGATIVE NEGATIVE Final    Comment:        The GeneXpert MRSA Assay (FDA approved for NASAL specimens only), is one component of a comprehensive MRSA colonization surveillance program. It is not intended to diagnose MRSA infection nor to guide or monitor treatment for MRSA infections.   Culture, routine-abscess     Status: None   Collection Time: 04/14/14  3:03 PM  Result Value Ref Range Status   Specimen Description ABSCESS LYMPH NODE  Final   Special Requests NONE  Final   Gram Stain   Final    RARE WBC PRESENT,BOTH PMN AND MONONUCLEAR NO SQUAMOUS EPITHELIAL CELLS SEEN NO ORGANISMS SEEN Performed at [facility]    Culture   Final    NO GROWTH 3 DAYS Performed at [facility]    Report Status 04/17/2014 FINAL  Final     Scheduled Meds: . aspirin  325 mg Oral Daily  . [START ON 04/29/2014] darbepoetin (ARANESP) injection - DIALYSIS  60 mcg Intravenous Q Thu-HD  . dextrose      . feeding supplement (NEPRO CARB STEADY)  237 mL Oral BID BM  . ferric gluconate (FERRLECIT/NULECIT) IV  62.5 mg Intravenous Q Thu-HD  . gabapentin  100 mg Oral Daily  . heparin  5,000 Units Subcutaneous 3 times per day  . insulin aspart  0-5 Units Subcutaneous QHS  . insulin aspart  0-9 Units Subcutaneous TID WC  . insulin glargine  7 Units Subcutaneous BID WC  . loratadine  10 mg Oral Daily  . montelukast  10 mg Oral QHS  . multivitamin  1 tablet Oral Daily  . pantoprazole  40 mg Oral QHS  . simvastatin  20 mg Oral QHS  . sodium  chloride  10-40 mL Intracatheter Q12H  . sodium chloride  3 mL Intravenous Q12H   Continuous Infusions: . sodium chloride 10  mL/hr at 04/19/14 1246     [place] Lapaglia, DO  [facility] Pager [phone]  If 7PM-7AM, please contact night-coverage [email or web address] Password [email or web address] 04/22/2014, 3:29 PM   LOS: 9 days

## 2014-04-22 NOTE — Progress Notes (Signed)
Hypoglycemic Event  CBG at 7:34  : 52  Treatment: 15 GM carbohydrate snack  Symptoms: Shaky  Follow-up CBG: Time: 08:03 CBG Result: 60  Treatment:  (ate breakfast)  Follow-up CBG: 8:46           CBG Result: 110   Possible Reasons for Event: Inadequate meal intake- patient stated she missed her nighttime snack  Comments/MD notified: Dr. Carles Collet paged    Leticia Penna A  Remember to initiate Hypoglycemia Order Set & complete

## 2014-04-22 NOTE — Progress Notes (Signed)
Subjective:   Headache. Feels stronger with PT, walking again.  Objective Filed Vitals:   04/21/14 1224 04/21/14 1657 04/21/14 2112 04/22/14 0441  BP: 121/65 141/70 108/70 131/67  Pulse: 89 94 96 90  Temp: 97.9 F (36.6 C) 98.2 F (36.8 C) 98 F (36.7 C) 98 F (36.7 C)  TempSrc: Oral Oral    Resp: 20 20 18 20   Height:      Weight:   69.4 kg (153 lb)   SpO2: 100% 100% 99% 98%   Physical Exam General: alert and oriented. No acute distress.  Still has line in right neck Heart: RRR 2/6 systolic Lungs: CTA. Unlabored.  Abdomen: soft nontender +BS Extremities: no edema. R sided weakness.  Dialysis Access: R thigh AVG +b/t.  HD: [facility] F160 3h 62min, 68.5kh 2/2.5 Bath R thigh AVG (PTA this adm 11/20) Heparin none Hect 2 ug TIW, Aranesp 25 mcg weekly Increased to 60 this adm Venofer 50/wk maint  Assessment/Plan: 1. 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 Not felt to need home PT. ASA and low dose statin.  2D ECHO normal EF, no source of emboli. Carotid duplex 1-39% stenosis.   2. Pulmonary edema - resolved (original reason for admission volume overload d/t missed dialysis). 3. ESRD - HD on TTS @ [facility], K 4.3. HD Saturday, on regular schedule. 4. HTN/Volume - BP 131/67, no meds; getting close to edw, no volume excess 5. Anemia - Hgb 9.1, Aranesp increased to 60 mcg on Wed (per holiday schedule), weekly Fe. 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 Use 2Ca bath. 7. Nutrition - Alb 2.8, carb-mod renal diet, vitamin. Nepro. Poor appetite 8. Disposition - currently working on placement/resourses to help with paying for hotel. No family that lives close   [name] Iron, NP [place] [phone] 04/22/2014,9:08 AM  LOS: 9 days   I have seen and examined this patient and agree  with plan and assessment in the above note of B Orvil Feil with highlighted additions. Pt hopes to be able to settle with the hotel she has been staying at tomorrow so could be discharged. No local resources available.  All acute medical issues stable. Next dialysis not due until Saturday. Jamal Maes, MD [place] Pager 04/22/2014, 10:23 AM   Additional Objective Labs: Basic Metabolic Panel:  Recent Labs Lab 04/18/14 0446 04/19/14 0725 04/21/14 0457  NA 136* 134* 136*  K 3.9 4.4 4.3  CL 95* 94* 96  CO2 26 27 29   GLUCOSE 133* 162* 88  BUN 19 35* 40*  CREATININE 6.55* 9.41* 8.56*  CALCIUM 9.2 9.5 10.0  PHOS  --  2.8 2.1*     Recent Labs Lab 04/19/14 0725 04/21/14 0457  ALBUMIN 2.8* 2.8*    Recent Labs Lab 04/18/14 0446 04/19/14 0725 04/21/14 0457  WBC 4.5 6.0 4.5  HGB 9.1* 9.3* 9.1*  HCT 28.8* 29.3* 28.8*  MCV 96.6 97.7 100.7*  PLT 167 188 202   Blood Culture    Component Value Date/Time   SDES ABSCESS LYMPH NODE 04/14/2014 1503   SPECREQUEST NONE 04/14/2014 1503   CULT  04/14/2014 1503    NO GROWTH 3 DAYS Performed at [facility] 04/17/2014 FINAL 04/14/2014 1503    Recent Labs Lab 04/21/14 1655  04/21/14 2110 04/22/14 0734 04/22/14 0803 04/22/14 0846  GLUCAP 337* 133* 52* 60* 110*   I Medications: . sodium chloride 10 mL/hr at 04/19/14 1246   . aspirin  325 mg Oral Daily  . calcium acetate  2,001 mg Oral TID WC  . [START ON 04/29/2014] darbepoetin (ARANESP) injection - DIALYSIS  60 mcg Intravenous Q Thu-HD  . dextrose      . doxercalciferol  2 mcg Intravenous Q T,Th,Sa-HD  . feeding supplement (NEPRO CARB STEADY)  237 mL Oral BID BM  . ferric gluconate (FERRLECIT/NULECIT) IV  62.5 mg Intravenous Q Thu-HD  . gabapentin  100 mg Oral Daily  . heparin  5,000 Units Subcutaneous 3 times per day  . insulin aspart  0-5 Units Subcutaneous QHS  . insulin aspart  0-9 Units Subcutaneous TID WC  .  insulin glargine  7 Units Subcutaneous BID WC  . loratadine  10 mg Oral Daily  . montelukast  10 mg Oral QHS  . multivitamin  1 tablet Oral Daily  . pantoprazole  40 mg Oral QHS  . simvastatin  20 mg Oral QHS  . sodium chloride  10-40 mL Intracatheter Q12H  . sodium chloride  3 mL Intravenous Q12H

## 2014-04-23 DIAGNOSIS — E8770 Fluid overload, unspecified: Principal | ICD-10-CM

## 2014-04-23 LAB — GLUCOSE, CAPILLARY
GLUCOSE-CAPILLARY: 228 mg/dL — AB (ref 70–99)
GLUCOSE-CAPILLARY: 37 mg/dL — AB (ref 70–99)
GLUCOSE-CAPILLARY: 82 mg/dL (ref 70–99)
Glucose-Capillary: 165 mg/dL — ABNORMAL HIGH (ref 70–99)

## 2014-04-23 MED ORDER — NEPRO/CARBSTEADY PO LIQD: 237.0000 mL | Freq: Two times a day (BID) | ORAL | Status: AC

## 2014-04-23 NOTE — Discharge Summary (Signed)
Physician Discharge Summary  [name] YJE:[id number] DOB: [birth year] DOA: 04/13/2014  PCP: [name], MD  Admit date: 04/13/2014 Discharge date: 04/23/2014  Recommendations for Outpatient Follow-up:  1. Pt will need to follow up with PCP in 1 week post discharge    Discharge Diagnoses:  Acute respiratory failure with hypoxia due to volume overload -resolved -> 3900 cc removed via UF 11/17 with 2100 cc removed 11/19 -no clinical evidence to suggest PNA  -weaned off oxygen--the patient remained clinically stable without any respiratory distress on room air   ESRD (end stage renal disease) -per Nephro  -cont HD--next dialysis 04/24/2014 -shuntogram completed per IR 11/20--widely patent R-thigh AV graft -Right IJ PICC line will need to be removed prior to discharge-placed by IR--04/14/2014  L pons infarct/CVA -with R arm numbness and weakness, improving -on Neuro exam, mild weakness and sensory deficits on R and diminished grip  -MRI was completed 11/22 which showed 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 -Not a TPA candidate due to being outside the window patient reported symptoms 4 days after the fact-- -MRA brain negative -Now on ASA 325mg  and on low dose statin per neurology -2D ECHo with normal EF, no cardiac source of embolus, Carotid duplex with 1-39% stenosis -hbaic 11.4 in July'14, LDL 83 -PT/Ot eval completed, home health services set up   DKA (diabetic ketoacidoses) and hypoglycemia -onset after admit and required D10 -11/18 CBG was > 300 on D10 so dc'd D10 and was transitioned to Lantus but at 1/2 home dose since she is usually very brittle -overnight into 11/19 went back into DKA (glucose 765, AG 26) so back on insulin drip  -pm of 11/19 transitioned back to Lantus, then had hypoglycemia, now resolved, monitor -has a well established hx of brittle DM. -add bed time snack as pt has had mild morning hypoglycemia    GERD/Gastroparesis -cont PPI RLS (restless legs syndrome)   Non compliance  Discharge Condition: Stable  Disposition: Home Follow-up Information    Follow up with [name], Gifford Shave, MD. Schedule an appointment as soon as possible for a visit in 1 week.   Specialty:  Family Medicine   Contact information:   [address] [place] [address] [phone]       Follow up with SETHI,PRAMOD, MD. Schedule an appointment as soon as possible for a visit in 1 month.   Specialties:  Neurology, Radiology   Why:  for Stroke FU   Contact information:   [address] [place] [address] [phone]       Kennedy with 1200cc fluid restrict Wt Readings from Last 3 Encounters:  04/21/14 69.4 kg (153 lb)  04/18/14 68.493 kg (151 lb)  01/26/14 70 kg (154 lb 5.2 oz)    History of present illness:  43 y.o. female, hx of DM1, failed kidney /pancreas transplant, ESRD on HD TTS ,presenting with SOB to ED. Missed last HD on Sat because daughter had a bad car accident that day.developed SOB early this am, as well complaining of cough, productive pneumonia, denies fever chills or chest pain,. Chest x-ray showing edema, possible pneumonia on x-ray, she was hypoxic on room air, SaO2 70's on RA. Requiring oxygen,.reports fever but no chills, no abd pain, n/v/d. The patient was find to have hyperglycemia with DKA, glucose more than 700, started on insulin drip, as well she had hyperkalemia. The patient is a known brittle diabetic with what fluctuations in her glycemic control. She was ultimately  transitioned to subcutaneous insulin. Although the patient continued to have what fluctuations, her CBGs were acceptable at the time of discharge.    Consultants: Nephrology neurology  Discharge Exam: Filed Vitals:   04/23/14 0748  BP: 138/69  Pulse: 91  Temp: 97.4 F (36.3 C)  Resp: 18   Filed Vitals:   04/22/14 1736 04/22/14 2025 04/23/14 0411  04/23/14 0748  BP: 152/82 149/68 129/71 138/69  Pulse: 95 95 89 91  Temp: 98.2 F (36.8 C) 98.3 F (36.8 C) 97.9 F (36.6 C) 97.4 F (36.3 C)  TempSrc: Oral Oral Oral Oral  Resp: 20 18 17 18   Height:      Weight:      SpO2: 97% 100% 93% 98%   General: A&O x 3, NAD, pleasant, cooperative Cardiovascular: RRR, no rub, no gallop, no S3 Respiratory: CTAB, no wheeze, no rhonchi Abdomen:soft, nontender, nondistended, positive bowel sounds Extremities: No edema, No lymphangitis, no petechiae  Discharge Instructions      Discharge Instructions    Discharge instructions    Complete by:  As directed   Renal Diabetic diet     Increase activity slowly    Complete by:  As directed             Medication List    STOP taking these medications        aspirin EC 81 MG tablet  Replaced by:  aspirin 325 MG tablet     NOVOLOG FLEXPEN 100 UNIT/ML FlexPen  Generic drug:  insulin aspart  Replaced by:  insulin aspart 100 UNIT/ML injection      TAKE these medications        albuterol 108 (90 BASE) MCG/ACT inhaler  Commonly known as:  PROVENTIL HFA;VENTOLIN HFA  Inhale 2 puffs into the lungs every 4 (four) hours as needed for wheezing or shortness of breath (as per home regimen).     aspirin 325 MG tablet  Take 1 tablet (325 mg total) by mouth daily.     calcium acetate 667 MG capsule  Commonly known as:  PHOSLO  Take 2,001 mg by mouth 3 (three) times daily with meals.     clonazePAM 0.5 MG tablet  Commonly known as:  KLONOPIN  Take 1 mg by mouth at bedtime.     feeding supplement (NEPRO CARB STEADY) Liqd  Take 237 mLs by mouth 2 (two) times daily between meals.     gabapentin 100 MG capsule  Commonly known as:  NEURONTIN  Take 100 mg by mouth daily.     insulin aspart 100 UNIT/ML injection  Commonly known as:  novoLOG  Inject 0-9 Units into the skin 3 (three) times daily with meals.     Insulin Glargine 100 UNIT/ML Solostar Pen  Commonly known as:  LANTUS  Inject 7  Units into the skin 2 (two) times daily with a meal.     lidocaine-prilocaine cream  Commonly known as:  EMLA  Apply 1 application topically 2 (two) times a week.     metoCLOPramide 5 MG tablet  Commonly known as:  REGLAN  Take 1 tablet (5 mg total) by mouth 4 (four) times daily -  before meals and at bedtime.     montelukast 10 MG tablet  Commonly known as:  SINGULAIR  Take 10 mg by mouth at bedtime.     multivitamin Tabs tablet  Take 1 tablet by mouth daily.     ondansetron 4 MG disintegrating tablet  Commonly known as:  ZOFRAN-ODT  Take  1 tablet (4 mg total) by mouth every 6 (six) hours as needed for nausea or vomiting.     pantoprazole 40 MG tablet  Commonly known as:  PROTONIX  Take 1 tablet (40 mg total) by mouth at bedtime.     simvastatin 20 MG tablet  Commonly known as:  ZOCOR  Take 1 tablet (20 mg total) by mouth at bedtime.     zolpidem 5 MG tablet  Commonly known as:  AMBIEN  Take 5 mg by mouth at bedtime.         The results of significant diagnostics from this hospitalization (including imaging, microbiology, ancillary and laboratory) are listed below for reference.    Significant Diagnostic Studies: Dg Chest 2 View  04/14/2014   CLINICAL DATA:  Evaluate for possible pneumonia, shortness of breath, productive cuff  EXAM: CHEST  2 VIEW  COMPARISON:  04/13/2014  FINDINGS: Cardiomediastinal silhouette is stable. There is improvement in aeration without convincing pulmonary edema or segmental infiltrate. Residual mild interstitial prominence bilateral upper lobes. Bony thorax is unremarkable.  IMPRESSION: Improvement in aeration without convincing pulmonary edema. No segmental infiltrate. Residual mild interstitial prominence bilateral upper lobes.   Electronically Signed   By: Lahoma Crocker M.D.   On: 04/14/2014 12:05   Mr [name] Contrast  04/18/2014   CLINICAL DATA:  43 year old female with acute numbness and tingling in the right side extremity for 5 days.  Initial encounter.  EXAM: MRI HEAD WITHOUT CONTRAST  MRA HEAD WITHOUT CONTRAST  TECHNIQUE: Multiplanar, multiecho pulse sequences of the brain and surrounding structures were obtained without intravenous contrast. Angiographic images of the head were obtained using MRA technique without contrast.  COMPARISON:  Non contrast head CTs 06/27/2013 and earlier.  FINDINGS: MRI HEAD FINDINGS  6 mm focus of restricted diffusion in the dorsal pons on the left (series 4, image 9). Associated mild T2 and FLAIR hyperintensity. No associated mass effect or hemorrhage.  No other restricted diffusion. Major intracranial vascular flow voids are preserved.  No midline shift, mass effect, evidence of mass lesion, ventriculomegaly, extra-axial collection or acute intracranial hemorrhage. Cervicomedullary junction and pituitary are within normal limits. Chronic lacunar been *SCRATCH* sec chronic lacunar infarct in the left globus pallidus. Mild nonspecific mostly periventricular cerebral white matter T2 and FLAIR hyperintensity possible chronic micro hemorrhages in the anterior left frontal lobe (series 8, image 15). No cortical encephalomalacia. Cerebellum within normal limits. Visible internal auditory structures appear normal.  Trace left mastoid effusion. Negative visualized nasopharynx. Other Visualized paranasal sinuses and mastoids are clear. Visualized orbit soft tissues are within normal limits. Visualized scalp soft tissues are within normal limits.  Nonspecific decreased T1 bone marrow signal throughout.  MRA HEAD FINDINGS  Antegrade flow in the posterior circulation. Codominant distal vertebral arteries. Dominant, normal AICA origins. No basilar stenosis. SCA and PCA origins are normal. Normal posterior communicating arteries. Bilateral PCA branches are normal.  Antegrade flow in both ICA siphons. Tortuous distal cervical ICAs, more so the right 8. No siphon stenosis. Ophthalmic and posterior communicating artery origins are  within normal limits. Normal carotid termini, MCA and ACA origins.  Anterior communicating artery and visualized bilateral ACA and MCA branches are within normal limits.  IMPRESSION: 1. Acute 6 mm lacunar infarct in the left dorsal pons. No mass effect or hemorrhage. 2. Other evidence of chronic small vessel disease including left globus pallidus chronic lacune, possible chronic micro hemorrhages left frontal lobe. 3. Negative intracranial MRA, but tortuous cervical ICAs raising the possibility  of chronic hypertension.   Electronically Signed   By: Lars Pinks M.D.   On: 04/18/2014 11:27   Mr [name] Contrast  04/18/2014   CLINICAL DATA:  43 year old female with acute numbness and tingling in the right side extremity for 5 days. Initial encounter.  EXAM: MRI HEAD WITHOUT CONTRAST  MRA HEAD WITHOUT CONTRAST  TECHNIQUE: Multiplanar, multiecho pulse sequences of the brain and surrounding structures were obtained without intravenous contrast. Angiographic images of the head were obtained using MRA technique without contrast.  COMPARISON:  Non contrast head CTs 06/27/2013 and earlier.  FINDINGS: MRI HEAD FINDINGS  6 mm focus of restricted diffusion in the dorsal pons on the left (series 4, image 9). Associated mild T2 and FLAIR hyperintensity. No associated mass effect or hemorrhage.  No other restricted diffusion. Major intracranial vascular flow voids are preserved.  No midline shift, mass effect, evidence of mass lesion, ventriculomegaly, extra-axial collection or acute intracranial hemorrhage. Cervicomedullary junction and pituitary are within normal limits. Chronic lacunar been *SCRATCH* sec chronic lacunar infarct in the left globus pallidus. Mild nonspecific mostly periventricular cerebral white matter T2 and FLAIR hyperintensity possible chronic micro hemorrhages in the anterior left frontal lobe (series 8, image 15). No cortical encephalomalacia. Cerebellum within normal limits. Visible internal auditory  structures appear normal.  Trace left mastoid effusion. Negative visualized nasopharynx. Other Visualized paranasal sinuses and mastoids are clear. Visualized orbit soft tissues are within normal limits. Visualized scalp soft tissues are within normal limits.  Nonspecific decreased T1 bone marrow signal throughout.  MRA HEAD FINDINGS  Antegrade flow in the posterior circulation. Codominant distal vertebral arteries. Dominant, normal AICA origins. No basilar stenosis. SCA and PCA origins are normal. Normal posterior communicating arteries. Bilateral PCA branches are normal.  Antegrade flow in both ICA siphons. Tortuous distal cervical ICAs, more so the right 8. No siphon stenosis. Ophthalmic and posterior communicating artery origins are within normal limits. Normal carotid termini, MCA and ACA origins.  Anterior communicating artery and visualized bilateral ACA and MCA branches are within normal limits.  IMPRESSION: 1. Acute 6 mm lacunar infarct in the left dorsal pons. No mass effect or hemorrhage. 2. Other evidence of chronic small vessel disease including left globus pallidus chronic lacune, possible chronic micro hemorrhages left frontal lobe. 3. Negative intracranial MRA, but tortuous cervical ICAs raising the possibility of chronic hypertension.   Electronically Signed   By: Lars Pinks M.D.   On: 04/18/2014 11:27   Ir Veno/jugular Left  04/16/2014   CLINICAL DATA:  renal failure, needs  durable venous access.  EXAM: RIGHT IJ CENTRAL VENOUS CATHETER PLACEMENT UNDER ULTRASOUND AND FLUOROSCOPIC GUIDANCE:  FLUOROSCOPY TIME:  6 min 12 seconds  TECHNIQUE: The procedure, risks (including but not limited to bleeding, infection, organ damage ), benefits, and alternatives were explained to the patient. Questions regarding the procedure were encouraged and answered. The patient understands and consents to the procedure. The patient had an indwelling ready EJ peripheral IV, so initially left-sided approach was  selected. Left neck prepped with chlorhexidine, draped in usual sterile fashion. Maximal barrier sterile technique was utilized including caps, mask, sterile gowns, sterile gloves, sterile drape, hand hygiene and skin antiseptic. Overlying skin infiltrated with 1% lidocaine. Under real-time ultrasound guidance, the left IJ vein was accessed with a 21 gauge micropuncture needle. The guidewire would not advance centrally easily. Small contrast venogram demonstrated occlusion of the central aspect of the left IJ vein. For this reason, a right-sided approach was used. Patency of the right  IJ vein was confirmed with ultrasound with image documentation. An appropriate skin site was determined. Skin site was marked. Region was prepped using maximum barrier technique including cap and mask, sterile gown, sterile gloves, large sterile sheet, and Chlorhexidine as cutaneous antisepsis. The region was infiltrated locally with 1% lidocaine. Under real-time ultrasound guidance, the right IJ vein was accessed with a 21 gauge micropuncture needle; the needle tip within the vein was confirmed with ultrasound image documentation. The guidewire would not advance centrally easily. Contrast venogram demonstrated occlusion of the central aspect of the right IJ vein, with a prominent collateral channel to the SVC. With the aid of an angled Glidewire, this was catheterized. A double-lumen PICC line would not advance through the length of the tortuosity of the course. For this reason, over the Glidewire a single lumen 5 [place] power injectable PICC catheter trimmed to 18 cm was advanced, positioned with the tip in the mid right atrium. Catheter was secured externally and flow flushed per protocol. The patient tolerated the procedure well.  COMPLICATIONS: None immediate  IMPRESSION: 1. Technically successful right IJ Single lumen power injectable central venous catheter placement.   Electronically Signed   By: Arne Cleveland M.D.   On:  04/14/2014 15:10   Ir Pta Venous Right  04/16/2014   CLINICAL DATA:  Pain overlying right thigh dialysis graft with right lower extremity edema. 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  EXAM: 1.  DIALYSIS AV SHUNTOGRAM  2.  ANGIOPLASTY OF RIGHT EXTERNAL AND COMMON ILIAC VEINS  COMPARISON:  None  CONTRAST:  50 mL Omnipaque 300  FLUOROSCOPY TIME:  1 min and 54 seconds  MEDICATIONS: 50 mcg IV fentanyl  PROCEDURE: The procedure, risks, benefits, and alternatives were explained to the patient. Questions regarding the procedure were encouraged and answered. The patient understands and consents to the procedure.  The right thigh dialysis graft was prepped with Betadine in a sterile fashion, and a sterile drape was applied covering the operative field. A diagnostic shunt study was performed via an 18 gauge angiocatheter introduced into venous outflow. Venous drainage was assessed to the level of the central veins in the chest. Proximal shunt was studied by reflux maneuver with temporary compression of venous outflow.  The angiocath was removed and replaced with a 7-French sheath over a guidewire. Balloon angioplasty of the right external iliac vein was then performed with a 10 mm x 4 cm Conquest balloon. Angiography was performed.  Additional balloon angioplasty was performed of the right common iliac vein with a 12 mm x 4 cm Atlas balloon.  Balloon angioplasty was followed by additional angiography. The sheath was removed and hemostasis obtained with application of a 2-0 Ethilon pursestring suture.  COMPLICATIONS: None  FINDINGS: The in loop graft in the right thigh is normally patent including arterial and venous anastomoses. The graft itself shows small focal pseudoaneurysms related to prior puncture sites. Venous outflow demonstrates focal stenosis of the distal external iliac vein of approximately 80%. Longer segmental stenosis of the common iliac vein approaches 80% in estimated caliber.  The inferior vena cava is widely patent.  After 10 mm balloon angioplasty, there was improvement in appearance of the external iliac vein stenosis with mild residual roughly 25% stenosis present. There remained moderate degree of common iliac vein narrowing of approximately 60- 70%. After further 12 mm balloon angioplasty of the common iliac vein, there is further improvement in patency with only mild 20- 25% narrowing remaining. Excellent flow was present through  the outflow veins on completion.  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 These were successfully treated with 10 mm and 12 mm balloon angioplasty with significant angiographic improvement, as above.  ACCESS: The graft and venous outflow remain amenable to percutaneous intervention.   Electronically Signed   By: Aletta Edouard M.D.   On: 04/16/2014 17:06   Ir Fluoro Guide Cv Line Right  04/14/2014   CLINICAL DATA:  renal failure, needs  durable venous access.  EXAM: RIGHT IJ CENTRAL VENOUS CATHETER PLACEMENT UNDER ULTRASOUND AND FLUOROSCOPIC GUIDANCE:  FLUOROSCOPY TIME:  6 min 12 seconds  TECHNIQUE: The procedure, risks (including but not limited to bleeding, infection, organ damage ), benefits, and alternatives were explained to the patient. Questions regarding the procedure were encouraged and answered. The patient understands and consents to the procedure. The patient had an indwelling ready EJ peripheral IV, so initially left-sided approach was selected. Left neck prepped with chlorhexidine, draped in usual sterile fashion. Maximal barrier sterile technique was utilized including caps, mask, sterile gowns, sterile gloves, sterile drape, hand hygiene and skin antiseptic. Overlying skin infiltrated with 1% lidocaine. Under real-time ultrasound guidance, the left IJ vein was accessed with a 21 gauge micropuncture needle. The guidewire would not advance centrally easily. Small  contrast venogram demonstrated occlusion of the central aspect of the left IJ vein. For this reason, a right-sided approach was used. Patency of the right IJ vein was confirmed with ultrasound with image documentation. An appropriate skin site was determined. Skin site was marked. Region was prepped using maximum barrier technique including cap and mask, sterile gown, sterile gloves, large sterile sheet, and Chlorhexidine as cutaneous antisepsis. The region was infiltrated locally with 1% lidocaine. Under real-time ultrasound guidance, the right IJ vein was accessed with a 21 gauge micropuncture needle; the needle tip within the vein was confirmed with ultrasound image documentation. The guidewire would not advance centrally easily. Contrast venogram demonstrated occlusion of the central aspect of the right IJ vein, with a prominent collateral channel to the SVC. With the aid of an angled Glidewire, this was catheterized. A double-lumen PICC line would not advance through the length of the tortuosity of the course. For this reason, over the Glidewire a single lumen 5 [place] power injectable PICC catheter trimmed to 18 cm was advanced, positioned with the tip in the mid right atrium. Catheter was secured externally and flow flushed per protocol. The patient tolerated the procedure well.  COMPLICATIONS: None immediate  IMPRESSION: 1. Technically successful right IJ Single lumen power injectable central venous catheter placement.   Electronically Signed   By: Arne Cleveland M.D.   On: 04/14/2014 15:10   Ir US Guide Vasc Access Left  04/16/2014   CLINICAL DATA:  renal failure, needs  durable venous access.  EXAM: RIGHT IJ CENTRAL VENOUS CATHETER PLACEMENT UNDER ULTRASOUND AND FLUOROSCOPIC GUIDANCE:  FLUOROSCOPY TIME:  6 min 12 seconds  TECHNIQUE: The procedure, risks (including but not limited to bleeding, infection, organ damage ), benefits, and alternatives were explained to the patient. Questions regarding the  procedure were encouraged and answered. The patient understands and consents to the procedure. The patient had an indwelling ready EJ peripheral IV, so initially left-sided approach was selected. Left neck prepped with chlorhexidine, draped in usual sterile fashion. Maximal barrier sterile technique was utilized including caps, mask, sterile gowns, sterile gloves, sterile drape, hand hygiene and skin antiseptic. Overlying skin infiltrated with 1% lidocaine. Under real-time  ultrasound guidance, the left IJ vein was accessed with a 21 gauge micropuncture needle. The guidewire would not advance centrally easily. Small contrast venogram demonstrated occlusion of the central aspect of the left IJ vein. For this reason, a right-sided approach was used. Patency of the right IJ vein was confirmed with ultrasound with image documentation. An appropriate skin site was determined. Skin site was marked. Region was prepped using maximum barrier technique including cap and mask, sterile gown, sterile gloves, large sterile sheet, and Chlorhexidine as cutaneous antisepsis. The region was infiltrated locally with 1% lidocaine. Under real-time ultrasound guidance, the right IJ vein was accessed with a 21 gauge micropuncture needle; the needle tip within the vein was confirmed with ultrasound image documentation. The guidewire would not advance centrally easily. Contrast venogram demonstrated occlusion of the central aspect of the right IJ vein, with a prominent collateral channel to the SVC. With the aid of an angled Glidewire, this was catheterized. A double-lumen PICC line would not advance through the length of the tortuosity of the course. For this reason, over the Glidewire a single lumen 5 [place] power injectable PICC catheter trimmed to 18 cm was advanced, positioned with the tip in the mid right atrium. Catheter was secured externally and flow flushed per protocol. The patient tolerated the procedure well.  COMPLICATIONS:  None immediate  IMPRESSION: 1. Technically successful right IJ Single lumen power injectable central venous catheter placement.   Electronically Signed   By: Arne Cleveland M.D.   On: 04/14/2014 15:10   Ir US Guide Vasc Access Right  04/14/2014   CLINICAL DATA:  renal failure, needs  durable venous access.  EXAM: RIGHT IJ CENTRAL VENOUS CATHETER PLACEMENT UNDER ULTRASOUND AND FLUOROSCOPIC GUIDANCE:  FLUOROSCOPY TIME:  6 min 12 seconds  TECHNIQUE: The procedure, risks (including but not limited to bleeding, infection, organ damage ), benefits, and alternatives were explained to the patient. Questions regarding the procedure were encouraged and answered. The patient understands and consents to the procedure. The patient had an indwelling ready EJ peripheral IV, so initially left-sided approach was selected. Left neck prepped with chlorhexidine, draped in usual sterile fashion. Maximal barrier sterile technique was utilized including caps, mask, sterile gowns, sterile gloves, sterile drape, hand hygiene and skin antiseptic. Overlying skin infiltrated with 1% lidocaine. Under real-time ultrasound guidance, the left IJ vein was accessed with a 21 gauge micropuncture needle. The guidewire would not advance centrally easily. Small contrast venogram demonstrated occlusion of the central aspect of the left IJ vein. For this reason, a right-sided approach was used. Patency of the right IJ vein was confirmed with ultrasound with image documentation. An appropriate skin site was determined. Skin site was marked. Region was prepped using maximum barrier technique including cap and mask, sterile gown, sterile gloves, large sterile sheet, and Chlorhexidine as cutaneous antisepsis. The region was infiltrated locally with 1% lidocaine. Under real-time ultrasound guidance, the right IJ vein was accessed with a 21 gauge micropuncture needle; the needle tip within the vein was confirmed with ultrasound image documentation. The  guidewire would not advance centrally easily. Contrast venogram demonstrated occlusion of the central aspect of the right IJ vein, with a prominent collateral channel to the SVC. With the aid of an angled Glidewire, this was catheterized. A double-lumen PICC line would not advance through the length of the tortuosity of the course. For this reason, over the Glidewire a single lumen 5 [place] power injectable PICC catheter trimmed to 18 cm was advanced, positioned with the tip  in the mid right atrium. Catheter was secured externally and flow flushed per protocol. The patient tolerated the procedure well.  COMPLICATIONS: None immediate  IMPRESSION: 1. Technically successful right IJ Single lumen power injectable central venous catheter placement.   Electronically Signed   By: Arne Cleveland M.D.   On: 04/14/2014 15:10   Dg Chest Port 1 View  04/13/2014   CLINICAL DATA:  Cough, shortness of breath and asthma.  EXAM: PORTABLE CHEST - 1 VIEW  COMPARISON:  01/26/2014  FINDINGS: The cardiac silhouette, mediastinal and hilar contours are stable. Persistent diffuse airspace process with slight further right lower lobe consolidation. Probable small pleural effusions. No pneumothorax.  IMPRESSION: Persistent diffuse airspace process with slight increase in right lower lobe consolidation.   Electronically Signed   By: Kalman Jewels M.D.   On: 04/13/2014 07:42   Ir Shuntogram/ Fistulagram Right Mod Sed  04/16/2014   CLINICAL DATA:  Pain overlying right thigh dialysis graft with right lower extremity edema. 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  EXAM: 1.  DIALYSIS AV SHUNTOGRAM  2.  ANGIOPLASTY OF RIGHT EXTERNAL AND COMMON ILIAC VEINS  COMPARISON:  None  CONTRAST:  50 mL Omnipaque 300  FLUOROSCOPY TIME:  1 min and 54 seconds  MEDICATIONS: 50 mcg IV fentanyl  PROCEDURE: The procedure, risks, benefits, and alternatives were explained to the patient. Questions regarding the procedure were  encouraged and answered. The patient understands and consents to the procedure.  The right thigh dialysis graft was prepped with Betadine in a sterile fashion, and a sterile drape was applied covering the operative field. A diagnostic shunt study was performed via an 18 gauge angiocatheter introduced into venous outflow. Venous drainage was assessed to the level of the central veins in the chest. Proximal shunt was studied by reflux maneuver with temporary compression of venous outflow.  The angiocath was removed and replaced with a 7-French sheath over a guidewire. Balloon angioplasty of the right external iliac vein was then performed with a 10 mm x 4 cm Conquest balloon. Angiography was performed.  Additional balloon angioplasty was performed of the right common iliac vein with a 12 mm x 4 cm Atlas balloon.  Balloon angioplasty was followed by additional angiography. The sheath was removed and hemostasis obtained with application of a 2-0 Ethilon pursestring suture.  COMPLICATIONS: None  FINDINGS: The in loop graft in the right thigh is normally patent including arterial and venous anastomoses. The graft itself shows small focal pseudoaneurysms related to prior puncture sites. Venous outflow demonstrates focal stenosis of the distal external iliac vein of approximately 80%. Longer segmental stenosis of the common iliac vein approaches 80% in estimated caliber. The inferior vena cava is widely patent.  After 10 mm balloon angioplasty, there was improvement in appearance of the external iliac vein stenosis with mild residual roughly 25% stenosis present. There remained moderate degree of common iliac vein narrowing of approximately 60- 70%. After further 12 mm balloon angioplasty of the common iliac vein, there is further improvement in patency with only mild 20- 25% narrowing remaining. Excellent flow was present through the outflow veins on completion.  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 These were successfully treated with 10 mm and 12 mm balloon angioplasty with significant angiographic improvement, as above.  ACCESS: The graft and venous outflow remain amenable to percutaneous intervention.   Electronically Signed   By: Jenness Corner.D.  On: 04/16/2014 17:06     Microbiology: Recent Results (from the past 240 hour(s))  MRSA PCR Screening     Status: None   Collection Time: 04/13/14  3:32 PM  Result Value Ref Range Status   MRSA by PCR NEGATIVE NEGATIVE Final    Comment:        The GeneXpert MRSA Assay (FDA approved for NASAL specimens only), is one component of a comprehensive MRSA colonization surveillance program. It is not intended to diagnose MRSA infection nor to guide or monitor treatment for MRSA infections.   Culture, routine-abscess     Status: None   Collection Time: 04/14/14  3:03 PM  Result Value Ref Range Status   Specimen Description ABSCESS LYMPH NODE  Final   Special Requests NONE  Final   Gram Stain   Final    RARE WBC PRESENT,BOTH PMN AND MONONUCLEAR NO SQUAMOUS EPITHELIAL CELLS SEEN NO ORGANISMS SEEN Performed at [facility]    Culture   Final    NO GROWTH 3 DAYS Performed at [facility]    Report Status 04/17/2014 FINAL  Final     Labs: Basic Metabolic Panel:  Recent Labs Lab 04/17/14 0535 04/18/14 0446 04/19/14 0725 04/21/14 0457  NA 135* 136* 134* 136*  K 4.3 3.9 4.4 4.3  CL 92* 95* 94* 96  CO2 27 26 27 29   GLUCOSE 266* 133* 162* 88  BUN 25* 19 35* 40*  CREATININE 9.35* 6.55* 9.41* 8.56*  CALCIUM 9.1 9.2 9.5 10.0  PHOS  --   --  2.8 2.1*   Liver Function Tests:  Recent Labs Lab 04/19/14 0725 04/21/14 0457  ALBUMIN 2.8* 2.8*   No results for input(s): LIPASE, AMYLASE in the last 168 hours. No results for input(s): AMMONIA in the last 168 hours. CBC:  Recent Labs Lab 04/18/14 0446 04/19/14 0725 04/21/14 0457  WBC 4.5 6.0 4.5    HGB 9.1* 9.3* 9.1*  HCT 28.8* 29.3* 28.8*  MCV 96.6 97.7 100.7*  PLT 167 188 202   Cardiac Enzymes: No results for input(s): CKTOTAL, CKMB, CKMBINDEX, TROPONINI in the last 168 hours. BNP: Invalid input(s): POCBNP CBG:  Recent Labs Lab 04/22/14 2021 04/23/14 0045 04/23/14 0157 04/23/14 0354 04/23/14 0747  GLUCAP 218* 37* 82 165* 228*    Time coordinating discharge:  Greater than 30 minutes  Signed:  Vianka Ertel, DO [facility] Pager: [phone] 04/23/2014, 9:01 AM

## 2014-04-23 NOTE — Progress Notes (Signed)
Hypoglycemic Event  CBG: 33  Treatment: 15 GM carbohydrate snack  Symptoms: Shaky and Nervous/irritable  Follow-up CBG: [id number] CBG Result:82  Possible Reasons for Event: Other: Illness     [name]  Remember to initiate Hypoglycemia Order Set & complete

## 2014-04-23 NOTE — Progress Notes (Signed)
Patient Discharge:  Disposition: home  Education: Educated patient on discharge instructions, discharge medications, and follow-up appointments.  Educated patient on stroke causes, risks, signs and symptoms with handout.  Patient verbalized understanding, AVS signed.    IV: IV team removed right IJ  Telemetry: n/a   Follow-up appointments: Scheduled follow-up appointment for 05/03/14 (earliest available) patient aware.  Prescriptions:  Hard copy given to patient  Transportation:  Escorted via wheelchair to ride  Belongings: gathered by patient

## 2014-04-23 NOTE — Care Management Note (Signed)
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  04/24/2015 Pt ready for d/c to home , no HH needs, IM given   Anticipated DC Date:  04/23/2014   Anticipated DC Plan:  [place]  CM consult      Choice offered to / List presented to:             Status of service:  Completed, signed off Medicare Important Message given?  YES (If response is "NO", the following Medicare IM given date fields will be blank) Date Medicare IM given:  04/16/2014 Medicare IM given by:  MAYO,HENRIETTA Date Additional Medicare IM given:  04/19/2014 Additional Medicare IM given by:  [facility]  Discharge Disposition:    Per UR Regulation:  Reviewed for med. necessity/level of care/duration of stay  If discussed at [place] of Stay Meetings, dates discussed:    Comments:  04/23/2014 IM given to pt who has had these previously. Plan to d/c to home today, has been able to work out housing issues with the hotel where she currently lives. No HH needs.  CRoyal RN MPH, case manager, [phone]   04/20/2014 Met with pt re plan to d/c today, will order any DME needed. PT eval not recommending HHPT. This CM has asked the pt to call her transportation system re need to have HD tomorrow due to holiday schedule. Pt currently working on this.  CRoyal RN MPH, case manager, [phone]

## 2014-04-23 NOTE — Progress Notes (Signed)
[facility] Progress Note  Assessment/Plan: 1. 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 Not felt to need home PT. ASA and low dose statin. 2D ECHO normal EF, no source of emboli. Carotid duplex 1-39% stenosis.   2. Pulmonary edema - resolved (original reason for admission volume overload d/t missed dialysis). 3. ESRD - HD on TTS [facility],  HD Saturday, on regular schedule at her HD center; had shuntagram 11/20 by IR for LE edema with PTA of focal 80% stenosis of distal ext iliac and longer seg common iliac veins.- check AF after d/c 4. HTN/Volume - BP 131/67, no meds; getting close to edw, no volume excess 5. Anemia - Hgb 9.1, Aranesp increased to 60 mcg on Wed (per holiday schedule), weekly Fe. 6. 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 Ca+ Hectorol 2 mcg- hold for now. Use 2Ca bath. 7. Nutrition - Alb 2.8, carb-mod renal diet, vitamin. Nepro. Poor appetite 8. DM - BS labile - 37 - 200s 9. Disposition - pt to be d/c today to return to Ext Stay [place], [place] [phone] 04/23/2014,8:56 AM  LOS: 10 days    I have seen and examined this patient and agree with plan ad assessment above. Says able to return to her hotel today so can be discharged.  Her next outpt HD will be tomorrow at [facility]. Ori Kreiter B,MD 04/23/2014 10:36 AM   Subjective:   Called lady at the motel where she stays - willing to accept her back today  Objective Filed Vitals:   04/22/14 1736 04/22/14 2025 04/23/14 0411 04/23/14 0748  BP: 152/82 149/68 129/71 138/69  Pulse: 95 95 89 91  Temp: 98.2 F (36.8 C) 98.3 F (36.8 C) 97.9 F (36.6 C) 97.4 F (36.3 C)  TempSrc: Oral Oral Oral Oral  Resp: 20 18 17 18   Height:      Weight:      SpO2: 97% 100% 93% 98%   Physical Exam General: NAD good  spirits Heart: RRR 2/6 murmur Lungs: no rales Abdomen: soft NT Extremities: no LE edema Dialysis Access: right thigh AVGG + bruit  Dialysis Orders: TTS [facility] F160 3h 73min, 68.5kh 2/2.5 Bath R thigh AVG (PTA this adm 11/20) Heparin none Hect 2 ug TIW, Aranesp 25 mcg weekly Increased to 60 this adm Venofer 50/wk maint  Additional Objective Labs: Basic Metabolic Panel:  Recent Labs Lab 04/18/14 0446 04/19/14 0725 04/21/14 0457  NA 136* 134* 136*  K 3.9 4.4 4.3  CL 95* 94* 96  CO2 26 27 29   GLUCOSE 133* 162* 88  BUN 19 35* 40*  CREATININE 6.55* 9.41* 8.56*  CALCIUM 9.2 9.5 10.0  PHOS  --  2.8 2.1*   Liver Function Tests:  Recent Labs Lab 04/19/14 0725 04/21/14 0457  ALBUMIN 2.8* 2.8*   CBC:  Recent Labs Lab 04/18/14 0446 04/19/14 0725 04/21/14 0457  WBC 4.5 6.0 4.5  HGB 9.1* 9.3* 9.1*  HCT 28.8* 29.3* 28.8*  MCV 96.6 97.7 100.7*  PLT 167 188 202    CBG:  Recent Labs Lab 04/22/14 2021 04/23/14 0045 04/23/14 0157 04/23/14 0354 04/23/14 0747  GLUCAP 218* 37* 82 165* 228*  Medications: . sodium chloride 10 mL/hr at 04/19/14 1246   . aspirin  325 mg Oral Daily  . [START ON  04/29/2014] darbepoetin (ARANESP) injection - DIALYSIS  60 mcg Intravenous Q Thu-HD  . feeding supplement (NEPRO CARB STEADY)  237 mL Oral BID BM  . ferric gluconate (FERRLECIT/NULECIT) IV  62.5 mg Intravenous Q Thu-HD  . gabapentin  100 mg Oral Daily  . heparin  5,000 Units Subcutaneous 3 times per day  . insulin aspart  0-5 Units Subcutaneous QHS  . insulin aspart  0-9 Units Subcutaneous TID WC  . insulin glargine  7 Units Subcutaneous BID WC  . loratadine  10 mg Oral Daily  . montelukast  10 mg Oral QHS  . multivitamin  1 tablet Oral Daily  . pantoprazole  40 mg Oral QHS  . simvastatin  20 mg Oral QHS  . sodium chloride  10-40 mL Intracatheter Q12H  . sodium chloride  3 mL Intravenous Q12H

## 2014-05-04 ENCOUNTER — Emergency Department (INDEPENDENT_AMBULATORY_CARE_PROVIDER_SITE_OTHER)
Admission: EM | Admit: 2014-05-04 | Discharge: 2014-05-04 | Disposition: A | Discharge status: Home / Self Care | Payer: Medicare Other | Source: Home / Self Care | Attending: Emergency Medicine | Admitting: Emergency Medicine

## 2014-05-04 ENCOUNTER — Encounter (HOSPITAL_COMMUNITY): Payer: Self-pay | Admitting: Emergency Medicine

## 2014-05-04 DIAGNOSIS — M25531 Pain in right wrist: Secondary | ICD-10-CM

## 2014-05-04 DIAGNOSIS — G5601 Carpal tunnel syndrome, right upper limb: Secondary | ICD-10-CM

## 2014-05-04 MED ORDER — LIDOCAINE 5 % EX PTCH: 1.0000 | MEDICATED_PATCH | CUTANEOUS | Status: DC

## 2014-05-04 MED ORDER — OXYCODONE-ACETAMINOPHEN 5-325 MG PO TABS: 1.0000 | ORAL_TABLET | Freq: Four times a day (QID) | ORAL | Status: DC | PRN

## 2014-05-04 NOTE — Discharge Instructions (Signed)
Carpal Tunnel Syndrome Carpal tunnel syndrome is a disorder of the nervous system in the wrist that causes pain, hand weakness, and/or loss of feeling. Carpal tunnel syndrome is caused by the compression, stretching, or irritation of the median nerve at the wrist joint. Athletes who experience carpal tunnel syndrome may notice a decrease in their performance to the condition, especially for sports that require strong hand or wrist action.  SYMPTOMS   Tingling, numbness, or burning pain in the hand or fingers.  Inability to sleep due to pain in the hand.  Sharp pains that shoot from the wrist up the arm or to the fingers, especially at night.  Morning stiffness or cramping of the hand.  Thumb weakness, resulting in difficulty holding objects or making a fist.  Shiny, dry skin on the hand.  Reduced performance in any sport requiring a strong grip. CAUSES   Median nerve damage at the wrist is caused by pressure due to swelling, inflammation, or scarred tissue.  Sources of pressure include:  Repetitive gripping or squeezing that causes inflammation of the tendon sheaths.  Scarring or shortening of the ligament that covers the median nerve.  Traumatic injury to the wrist or forearm such as fracture, sprain, or dislocation.  Prolonged hyperextension (wrist bent backward) or hyperflexion (wrist bent downward) of the wrist. RISK INCREASES WITH:  Diabetes mellitus.  Menopause or amenorrhea.  Rheumatoid arthritis.  Raynaud disease.  Pregnancy.  Gout.  Kidney disease.  Ganglion cyst.  Repetitive hand or wrist action.  Hypothyroidism (underactive thyroid gland).  Repetitive jolting or shaking of the hands or wrist.  Prolonged forceful weight-bearing on the hands. PREVENTION  Bracing the hand and wrist straight during activities that involve repetitive grasping.  For activities that require prolonged extension of the wrist (bending towards the top of the forearm)  periodically change the position of your wrists.  Learn and use proper technique in activities that result in the wrist position in neutral to slight extension.  Avoid bending the wrist into full extension or flexion (up or down).  Keep the wrist in a straight (neutral) position. To keep the wrist in this position, wear a splint.  Avoid repetitive hand and wrist motions.  When possible avoid prolonged grasping of items (steering wheel of a car, a pen, a vacuum cleaner, or a rake).  Loosen your grip for activities that require prolonged grasping of items.  Place keyboards and writing surfaces at the correct height as to decrease strain on the wrist and hand.  Alternate work tasks to avoid prolonged wrist flexion.  Avoid pinching activities (needlework and writing) as they may irritate your carpal tunnel syndrome.  If these activities are necessary, complete them for shorter periods of time.  When writing, use a felt tip or rollerball pen and/or build up the grip on a pen to decrease the forces required for writing. PROGNOSIS  Carpal tunnel syndrome is usually curable with appropriate conservative treatment and sometimes resolves spontaneously. For some cases, surgery is necessary, especially if muscle wasting or nerve changes have developed.  RELATED COMPLICATIONS   Permanent numbness and a weak thumb or fingers in the affected hand.  Permanent paralysis of a portion of the hand and fingers. TREATMENT  Treatment initially consists of stopping activities that aggravate the symptoms as well as medication and ice to reduce inflammation. A wrist splint is often recommended for wear during activities of repetitive motion as well as at night. It is also important to learn and use proper technique when  performing activities that typically cause pain. On occasion, a corticosteroid injection may be given. If symptoms persist despite conservative treatment, surgery may be an option. Surgical  techniques free the pinched or compressed nerve. Carpal tunnel surgery is usually performed on an outpatient basis, meaning you go home the same day as surgery. These procedures provide almost complete relief of all symptoms in 95% of patients. Expect at least 2 weeks for healing after surgery. For cases that are the result of repeated jolting or shaking of the hand or wrist or prolonged hyperextension, surgery is not usually recommended because stretching of the median nerve, not compression, is usually the cause of carpal tunnel syndrome in these cases. MEDICATION   If pain medication is necessary, nonsteroidal anti-inflammatory medications, such as aspirin and ibuprofen, or other minor pain relievers, such as acetaminophen, are often recommended.  Do not take pain medication for 7 days before surgery.  Prescription pain relievers are usually only prescribed after surgery. Use only as directed and only as much as you need.  Corticosteroid injections may be given to reduce inflammation. However, they are not always recommended.  Vitamin B6 (pyridoxine) may reduce symptoms; use only if prescribed for your disorder. SEEK MEDICAL CARE IF:   Symptoms get worse or do not improve in 2 weeks despite treatment.  You also have a current or recent history of neck or shoulder injury that has resulted in pain or tingling elsewhere in your arm. Document Released: 05/14/2005 Document Revised: 09/28/2013 Document Reviewed: 08/26/2008 [facility] Patient Information 2015 [place], [place]. This information is not intended to replace advice given to you by your health care provider. Make sure you discuss any questions you have with your health care provider.

## 2014-05-04 NOTE — ED Notes (Signed)
Reports right hand pain, aching and painful.  Reports weakness and does drop things.  Patient has an appointment with dr Leanora Cover in 3 weeks, but cannot wait until then.

## 2014-05-04 NOTE — ED Provider Notes (Signed)
CSN: [id number]     Arrival date & time 05/04/14  1613 History   First MD Initiated Contact with Patient 05/04/14 1703     Chief Complaint  Patient presents with  . Hand Pain   (Consider location/radiation/quality/duration/timing/severity/associated sxs/prior Treatment) HPI         43 year old female with recent history of a stroke with no residual deficits, on hemodialysis, with history of carpal tunnel syndrome, with history of carpal tunnel surgery in both wrists, presents complaining of worsening of her carpal tunnel syndrome in her right wrist. She has pain, aching, numbness in her middle 3 fingers, worse at night. Pain severe, keeping her awake at night. She has our he made a follow-up appointment with her hand surgeon but they could not see her for 3 more weeks. She is taking ibuprofen and Tylenol with no relief. No swelling, no injury. Her surgery was one year ago.  Past Medical History  Diagnosis Date  . Hyperlipidemia   . Alopecia   . Obesity   . Iron deficiency   . ESRD (end stage renal disease)     S/p pancreatic and kidney transplant, but back on HD 2008, dialysis at [facility], [place], Sat  . RLS (restless legs syndrome)   . Injury of conjunctiva and corneal abrasion of right eye without foreign body   . Cellulitis   . Anemia   . Blood transfusion 2004    "when I had my transplant"  . Migraine   . Hyperparathyroidism, secondary   . Diabetic retinopathy   . Hypertension     takes Metoprolol and Exforge daily  . Depression     takes Klonopin nightly  . Diabetes mellitus type 1     Pt states diagnosed at age 53 with prior episodes of DKA. S/p pancreatic transplant   . GERD (gastroesophageal reflux disease)     takes Protonix daily  . Bronchitis   . Migraine   . Asthma   . Emphysema of lung   . Angina     none in past 2 years.  . Stomach pain     Hx: of chronic  . Pneumonia 2012    "double"  . Dialysis patient     tues,thurs,sat  . 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  Surgery was done at [facility].      Past Surgical History  Procedure Laterality Date  . 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 Thyroglobulin      x 7  . Av fistula placement      right upper arm  . Cholecystectomy  1995  .  hd graft placement/removal      "had 2 in my left upper arm"  . Eye surgery    . Retinopathy surgery    . Tooth extraction  10/10/11    X's two  . Insertion of dialysis catheter  12/08/2011    Procedure: INSERTION OF DIALYSIS CATHETER;  Surgeon: Angelia Mould, MD;  Location: [place];  Service: Vascular;  Laterality: Left;  . Av fistula placement  01/22/2012    Procedure: INSERTION OF ARTERIOVENOUS (AV) GORE-TEX GRAFT ARM;  Surgeon: Angelia Mould, MD;  Location: [place];  Service: Vascular;  Laterality: Left;  Ultrasound guided.  Marland Kitchen  Av fistula placement  03/14/2012    Procedure: INSERTION OF ARTERIOVENOUS (AV) GORE-TEX GRAFT THIGH;  Surgeon: Angelia Mould, MD;  Location: [place];  Service: Vascular;  Laterality: Right;  . False aneurysm repair Right 01/02/2013    Procedure: Excision of lymphocele in right thigh.;  Surgeon: Angelia Mould, MD;  Location: [place];  Service: Vascular;  Laterality: Right;  . Carpal tunnel release Right 03/02/2013    Procedure: CARPAL TUNNEL RELEASE;  Surgeon: Tennis Must, MD;  Location: [place];  Service: Orthopedics;  Laterality: Right;  . Flexible sigmoidoscopy N/A 03/24/2013    Procedure: FLEXIBLE SIGMOIDOSCOPY;  Surgeon: Cleotis Nipper, MD;  Location: [facility] ENDOSCOPY;  Service: Endoscopy;  Laterality: N/A;  . Revision of arteriovenous goretex graft Right 04/02/2013    Procedure: REPAIR OF PSEUDOANEURYSM OF ARTERIOVENOUS GORETEX  GRAFT;  Surgeon: Angelia Mould, MD;  Location: [place];  Service: Vascular;  Laterality: Right;  . Carpal tunnel release Left 10/16/2013    Procedure: LEFT CARPAL TUNNEL RELEASE;  Surgeon: Tennis Must, MD;  Location: [place];  Service: Orthopedics;  Laterality: Left;   Family History  Problem Relation Age of Onset  . Hypertension Mother   . Kidney disease Mother   . Diabetes Mother   . Hypertension Father   . Kidney disease Father   . Diabetes Father    History  Substance Use Topics  . Smoking status: Never Smoker   . Smokeless tobacco: Never Used  . Alcohol Use: No   OB History    No data available     Review of Systems  Musculoskeletal: Positive for arthralgias (right wrist pain).  Neurological: Positive for numbness.  All other systems reviewed and are negative.   Allergies  Depakote; Morphine and related; Penicillins; Tramadol; Imitrex; and Vicodin  Home Medications   Prior to Admission medications   Medication Sig Start Date End Date Taking? Authorizing Provider  metoprolol succinate (TOPROL-XL) 100 MG 24 hr tablet Take 100 mg by mouth daily. Take with or immediately following a meal.   Yes Historical Provider, MD  albuterol (PROVENTIL HFA;VENTOLIN HFA) 108 (90 BASE) MCG/ACT inhaler Inhale 2 puffs into the lungs every 4 (four) hours as needed for wheezing or shortness of breath (as per home regimen). 02/20/13   Kinnie Feil, MD  aspirin 325 MG tablet Take 1 tablet (325 mg total) by mouth daily. 04/20/14   Domenic Polite, MD  calcium acetate (PHOSLO) 667 MG capsule Take 2,001 mg by mouth 3 (three) times daily with meals.    Historical Provider, MD  clonazePAM (KLONOPIN) 0.5 MG tablet Take 1 mg by mouth at bedtime.     Historical Provider, MD  gabapentin (NEURONTIN) 100 MG capsule Take 100 mg by mouth daily.    Historical Provider, MD  insulin aspart (NOVOLOG) 100 UNIT/ML injection Inject 0-9 Units into the skin 3 (three) times daily with  meals. 04/20/14   Domenic Polite, MD  Insulin Glargine (LANTUS) 100 UNIT/ML Solostar Pen Inject 7 Units into the skin 2 (two) times daily with a meal. 01/22/14   Allie Bossier, MD  lidocaine (LIDODERM) 5 % Place 1 patch onto the skin daily. Remove & Discard patch within 12 hours or as directed by MD 05/04/14   Liam Graham, PA-C  lidocaine-prilocaine (EMLA) cream Apply 1 application topically 2 (two) times a week.  10/24/12   Historical Provider, MD  metoCLOPramide (REGLAN) 5 MG tablet Take 1 tablet (5 mg total) by  mouth 4 (four) times daily -  before meals and at bedtime. 12/01/13   Janece Canterbury, MD  montelukast (SINGULAIR) 10 MG tablet Take 10 mg by mouth at bedtime.    Historical Provider, MD  multivitamin (RENA-VIT) TABS tablet Take 1 tablet by mouth daily.    Historical Provider, MD  Nutritional Supplements (FEEDING SUPPLEMENT, NEPRO CARB STEADY,) LIQD Take 237 mLs by mouth 2 (two) times daily between meals. 04/23/14   Orson Eva, MD  ondansetron (ZOFRAN-ODT) 4 MG disintegrating tablet Take 1 tablet (4 mg total) by mouth every 6 (six) hours as needed for nausea or vomiting. 12/01/13   Janece Canterbury, MD  oxyCODONE-acetaminophen (PERCOCET/ROXICET) 5-325 MG per tablet Take 1 tablet by mouth every 6 (six) hours as needed for moderate pain or severe pain. 05/04/14   Freeman Caldron Ameilia Rattan, PA-C  pantoprazole (PROTONIX) 40 MG tablet Take 1 tablet (40 mg total) by mouth at bedtime. 03/27/13   Hosie Poisson, MD  simvastatin (ZOCOR) 20 MG tablet Take 1 tablet (20 mg total) by mouth at bedtime. 04/20/14   Domenic Polite, MD  zolpidem (AMBIEN) 5 MG tablet Take 5 mg by mouth at bedtime.    Historical Provider, MD   BP 123/60 mmHg  Pulse 84  Temp(Src) 97.9 F (36.6 C) (Oral)  Resp 16  SpO2 99%  LMP 03/26/2014 Physical Exam  Constitutional: She is oriented to person, place, and time. Vital signs are normal. She appears well-developed and well-nourished. No distress.  HENT:  Head: Normocephalic and atraumatic.   Pulmonary/Chest: Effort normal. No respiratory distress.  Musculoskeletal:       Right wrist: She exhibits tenderness.  There is a surgical scar on the dorsum of both wrists consistent with prior carpal tunnel surgery. The dorsum of the right wrist is diffusely tender. She has a positive Tinel's sign and a positive Phalen's test.  Neurological: She is alert and oriented to person, place, and time. She has normal strength. Coordination normal.  Skin: Skin is warm and dry. No rash noted. She is not diaphoretic.  Psychiatric: She has a normal mood and affect. Judgment normal.  Nursing note and vitals reviewed.   ED Course  Procedures (including critical care time) Labs Review Labs Reviewed - No data to display  Imaging Review No results found.   MDM   1. Right wrist pain   2. Carpal tunnel syndrome of right wrist    Will Rx lidoderm patches and norco for pain and she will follow up with hand surgery as scheduled   Meds ordered this encounter  Medications  . metoprolol succinate (TOPROL-XL) 100 MG 24 hr tablet    Sig: Take 100 mg by mouth daily. Take with or immediately following a meal.  . oxyCODONE-acetaminophen (PERCOCET/ROXICET) 5-325 MG per tablet    Sig: Take 1 tablet by mouth every 6 (six) hours as needed for moderate pain or severe pain.    Dispense:  15 tablet    Refill:  0    Order Specific Question:  Supervising Provider    Answer:  Billy Fischer [phone]  . lidocaine (LIDODERM) 5 %    Sig: Place 1 patch onto the skin daily. Remove & Discard patch within 12 hours or as directed by MD    Dispense:  30 patch    Refill:  0    Order Specific Question:  Supervising Provider    Answer:  Ihor Gully D Whiting, PA-C 05/04/14 [id number]

## 2014-05-06 ENCOUNTER — Encounter (HOSPITAL_COMMUNITY): Payer: Self-pay | Admitting: Vascular Surgery

## 2014-05-17 ENCOUNTER — Inpatient Hospital Stay (HOSPITAL_COMMUNITY): Payer: Medicare Other

## 2014-05-17 ENCOUNTER — Emergency Department (HOSPITAL_COMMUNITY): Payer: Medicare Other

## 2014-05-17 ENCOUNTER — Inpatient Hospital Stay (HOSPITAL_COMMUNITY)
Admission: EM | Admit: 2014-05-17 | Discharge: 2014-05-20 | DRG: 637 | Disposition: A | Discharge status: Home / Self Care | Payer: Medicare Other | Attending: Family Medicine | Admitting: Family Medicine

## 2014-05-17 ENCOUNTER — Encounter (HOSPITAL_COMMUNITY): Payer: Self-pay | Admitting: *Deleted

## 2014-05-17 DIAGNOSIS — I639 Cerebral infarction, unspecified: Secondary | ICD-10-CM | POA: Diagnosis present

## 2014-05-17 DIAGNOSIS — Z789 Other specified health status: Secondary | ICD-10-CM

## 2014-05-17 DIAGNOSIS — J40 Bronchitis, not specified as acute or chronic: Secondary | ICD-10-CM | POA: Diagnosis present

## 2014-05-17 DIAGNOSIS — E10649 Type 1 diabetes mellitus with hypoglycemia without coma: Secondary | ICD-10-CM | POA: Diagnosis not present

## 2014-05-17 DIAGNOSIS — J45909 Unspecified asthma, uncomplicated: Secondary | ICD-10-CM | POA: Diagnosis present

## 2014-05-17 DIAGNOSIS — F329 Major depressive disorder, single episode, unspecified: Secondary | ICD-10-CM | POA: Diagnosis present

## 2014-05-17 DIAGNOSIS — E875 Hyperkalemia: Secondary | ICD-10-CM | POA: Diagnosis present

## 2014-05-17 DIAGNOSIS — G2581 Restless legs syndrome: Secondary | ICD-10-CM | POA: Diagnosis present

## 2014-05-17 DIAGNOSIS — Z841 Family history of disorders of kidney and ureter: Secondary | ICD-10-CM

## 2014-05-17 DIAGNOSIS — K219 Gastro-esophageal reflux disease without esophagitis: Secondary | ICD-10-CM | POA: Diagnosis present

## 2014-05-17 DIAGNOSIS — I120 Hypertensive chronic kidney disease with stage 5 chronic kidney disease or end stage renal disease: Secondary | ICD-10-CM | POA: Diagnosis present

## 2014-05-17 DIAGNOSIS — Z833 Family history of diabetes mellitus: Secondary | ICD-10-CM | POA: Diagnosis not present

## 2014-05-17 DIAGNOSIS — N186 End stage renal disease: Secondary | ICD-10-CM | POA: Diagnosis present

## 2014-05-17 DIAGNOSIS — E785 Hyperlipidemia, unspecified: Secondary | ICD-10-CM | POA: Diagnosis present

## 2014-05-17 DIAGNOSIS — Z880 Allergy status to penicillin: Secondary | ICD-10-CM

## 2014-05-17 DIAGNOSIS — E1065 Type 1 diabetes mellitus with hyperglycemia: Secondary | ICD-10-CM | POA: Diagnosis present

## 2014-05-17 DIAGNOSIS — E10319 Type 1 diabetes mellitus with unspecified diabetic retinopathy without macular edema: Secondary | ICD-10-CM | POA: Diagnosis present

## 2014-05-17 DIAGNOSIS — E1010 Type 1 diabetes mellitus with ketoacidosis without coma: Principal | ICD-10-CM

## 2014-05-17 DIAGNOSIS — Z940 Kidney transplant status: Secondary | ICD-10-CM

## 2014-05-17 DIAGNOSIS — Z8249 Family history of ischemic heart disease and other diseases of the circulatory system: Secondary | ICD-10-CM | POA: Diagnosis not present

## 2014-05-17 DIAGNOSIS — D649 Anemia, unspecified: Secondary | ICD-10-CM | POA: Diagnosis present

## 2014-05-17 DIAGNOSIS — Z992 Dependence on renal dialysis: Secondary | ICD-10-CM

## 2014-05-17 DIAGNOSIS — K3184 Gastroparesis: Secondary | ICD-10-CM | POA: Diagnosis present

## 2014-05-17 DIAGNOSIS — E669 Obesity, unspecified: Secondary | ICD-10-CM | POA: Diagnosis present

## 2014-05-17 DIAGNOSIS — Z9119 Patient's noncompliance with other medical treatment and regimen: Secondary | ICD-10-CM | POA: Diagnosis present

## 2014-05-17 DIAGNOSIS — E1110 Type 2 diabetes mellitus with ketoacidosis without coma: Secondary | ICD-10-CM | POA: Diagnosis present

## 2014-05-17 DIAGNOSIS — E8770 Fluid overload, unspecified: Secondary | ICD-10-CM | POA: Diagnosis present

## 2014-05-17 DIAGNOSIS — E871 Hypo-osmolality and hyponatremia: Secondary | ICD-10-CM | POA: Diagnosis present

## 2014-05-17 DIAGNOSIS — R739 Hyperglycemia, unspecified: Secondary | ICD-10-CM | POA: Diagnosis present

## 2014-05-17 DIAGNOSIS — R29898 Other symptoms and signs involving the musculoskeletal system: Secondary | ICD-10-CM

## 2014-05-17 DIAGNOSIS — Z794 Long term (current) use of insulin: Secondary | ICD-10-CM | POA: Diagnosis not present

## 2014-05-17 DIAGNOSIS — J439 Emphysema, unspecified: Secondary | ICD-10-CM | POA: Diagnosis present

## 2014-05-17 DIAGNOSIS — E1029 Type 1 diabetes mellitus with other diabetic kidney complication: Secondary | ICD-10-CM | POA: Diagnosis present

## 2014-05-17 DIAGNOSIS — I69351 Hemiplegia and hemiparesis following cerebral infarction affecting right dominant side: Secondary | ICD-10-CM | POA: Diagnosis not present

## 2014-05-17 DIAGNOSIS — Z6828 Body mass index (BMI) 28.0-28.9, adult: Secondary | ICD-10-CM

## 2014-05-17 DIAGNOSIS — Z9483 Pancreas transplant status: Secondary | ICD-10-CM

## 2014-05-17 DIAGNOSIS — N2581 Secondary hyperparathyroidism of renal origin: Secondary | ICD-10-CM | POA: Diagnosis present

## 2014-05-17 DIAGNOSIS — I6350 Cerebral infarction due to unspecified occlusion or stenosis of unspecified cerebral artery: Secondary | ICD-10-CM | POA: Diagnosis present

## 2014-05-17 DIAGNOSIS — Z888 Allergy status to other drugs, medicaments and biological substances status: Secondary | ICD-10-CM

## 2014-05-17 DIAGNOSIS — Z885 Allergy status to narcotic agent status: Secondary | ICD-10-CM | POA: Diagnosis not present

## 2014-05-17 DIAGNOSIS — E8779 Other fluid overload: Secondary | ICD-10-CM | POA: Insufficient documentation

## 2014-05-17 LAB — CBC WITH DIFFERENTIAL/PLATELET
Basophils Absolute: 0 10*3/uL (ref 0.0–0.1)
Basophils Relative: 0 % (ref 0–1)
EOS ABS: 0 10*3/uL (ref 0.0–0.7)
EOS PCT: 0 % (ref 0–5)
HCT: 39.7 % (ref 36.0–46.0)
HEMOGLOBIN: 11.8 g/dL — AB (ref 12.0–15.0)
LYMPHS PCT: 18 % (ref 12–46)
Lymphs Abs: 1.3 10*3/uL (ref 0.7–4.0)
MCH: 33.1 pg (ref 26.0–34.0)
MCHC: 29.7 g/dL — AB (ref 30.0–36.0)
MCV: 111.2 fL — ABNORMAL HIGH (ref 78.0–100.0)
Monocytes Absolute: 0.4 10*3/uL (ref 0.1–1.0)
Monocytes Relative: 5 % (ref 3–12)
NEUTROS PCT: 77 % (ref 43–77)
Neutro Abs: 5.6 10*3/uL (ref 1.7–7.7)
Platelets: 142 10*3/uL — ABNORMAL LOW (ref 150–400)
RBC: 3.57 MIL/uL — ABNORMAL LOW (ref 3.87–5.11)
RDW: 16 % — ABNORMAL HIGH (ref 11.5–15.5)
WBC: 7.3 10*3/uL (ref 4.0–10.5)

## 2014-05-17 LAB — BASIC METABOLIC PANEL
ANION GAP: 27 — AB (ref 5–15)
Anion gap: 30 — ABNORMAL HIGH (ref 5–15)
Anion gap: 22 — ABNORMAL HIGH (ref 5–15)
BUN: 51 mg/dL — AB (ref 6–23)
BUN: 55 mg/dL — ABNORMAL HIGH (ref 6–23)
BUN: 30 mg/dL — ABNORMAL HIGH (ref 6–23)
CALCIUM: 6.9 mg/dL — AB (ref 8.4–10.5)
CHLORIDE: 75 meq/L — AB (ref 96–112)
CO2: 12 meq/L — AB (ref 19–32)
CO2: 15 meq/L — AB (ref 19–32)
CO2: 22 meq/L (ref 19–32)
Calcium: 7.4 mg/dL — ABNORMAL LOW (ref 8.4–10.5)
Calcium: 7.3 mg/dL — ABNORMAL LOW (ref 8.4–10.5)
Chloride: 72 mEq/L — ABNORMAL LOW (ref 96–112)
Chloride: 92 mEq/L — ABNORMAL LOW (ref 96–112)
Creatinine, Ser: 9.12 mg/dL — ABNORMAL HIGH (ref 0.50–1.10)
Creatinine, Ser: 9.44 mg/dL — ABNORMAL HIGH (ref 0.50–1.10)
Creatinine, Ser: 5.4 mg/dL — ABNORMAL HIGH (ref 0.50–1.10)
GFR calc Af Amer: 6 mL/min — ABNORMAL LOW (ref >90)
GFR calc Af Amer: 5 mL/min — ABNORMAL LOW (ref >90)
GFR calc Af Amer: 10 mL/min — ABNORMAL LOW (ref >90)
GFR calc non Af Amer: 5 mL/min — ABNORMAL LOW (ref >90)
GFR calc non Af Amer: 9 mL/min — ABNORMAL LOW (ref >90)
GFR, EST NON AFRICAN AMERICAN: 5 mL/min — AB (ref >90)
GLUCOSE: 1079 mg/dL — AB (ref 70–99)
GLUCOSE: 1237 mg/dL — AB (ref 70–99)
Glucose, Bld: 514 mg/dL — ABNORMAL HIGH (ref 70–99)
POTASSIUM: 7.5 meq/L — AB (ref 3.7–5.3)
POTASSIUM: 5.6 meq/L — AB (ref 3.7–5.3)
POTASSIUM: 3.3 meq/L — AB (ref 3.7–5.3)
SODIUM: 114 meq/L — AB (ref 137–147)
SODIUM: 117 meq/L — AB (ref 137–147)
SODIUM: 136 meq/L — AB (ref 137–147)

## 2014-05-17 LAB — I-STAT VENOUS BLOOD GAS, ED
Acid-base deficit: 14 mmol/L — ABNORMAL HIGH (ref 0.0–2.0)
Bicarbonate: 14.4 mEq/L — ABNORMAL LOW (ref 20.0–24.0)
O2 Saturation: 58 %
PH VEN: 7.156 — AB (ref 7.250–7.300)
PO2 VEN: 39 mmHg (ref 30.0–45.0)
Patient temperature: 98.6
TCO2: 16 mmol/L (ref 0–100)
pCO2, Ven: 40.8 mmHg — ABNORMAL LOW (ref 45.0–50.0)

## 2014-05-17 LAB — I-STAT CHEM 8, ED
BUN: 67 mg/dL — AB (ref 6–23)
CREATININE: 9.8 mg/dL — AB (ref 0.50–1.10)
Calcium, Ion: 0.87 mmol/L — ABNORMAL LOW (ref 1.12–1.23)
Chloride: 84 mEq/L — ABNORMAL LOW (ref 96–112)
Glucose, Bld: >700 mg/dL (ref 70–99)
HCT: 46 % (ref 36.0–46.0)
HEMOGLOBIN: 15.6 g/dL — AB (ref 12.0–15.0)
POTASSIUM: 8.3 meq/L — AB (ref 3.7–5.3)
Sodium: 112 mEq/L — CL (ref 137–147)
TCO2: 15 mmol/L (ref 0–100)

## 2014-05-17 LAB — I-STAT TROPONIN, ED: Troponin i, poc: 0 ng/mL (ref 0.00–0.08)

## 2014-05-17 LAB — CBG MONITORING, ED: Glucose-Capillary: >600 mg/dL (ref 70–99)

## 2014-05-17 MED ORDER — PROMETHAZINE HCL 25 MG/ML IJ SOLN
INTRAMUSCULAR | Status: AC
  Filled 2014-05-17: qty 1

## 2014-05-17 MED ORDER — ALTEPLASE 2 MG IJ SOLR
2.0000 mg | Freq: Once | INTRAMUSCULAR | Status: AC | PRN
  Filled 2014-05-17: qty 2

## 2014-05-17 MED ORDER — ONDANSETRON 4 MG PO TBDP
8.0000 mg | ORAL_TABLET | Freq: Once | ORAL | Status: AC
  Administered 2014-05-17: 8 mg via ORAL
  Filled 2014-05-17: qty 2

## 2014-05-17 MED ORDER — SODIUM CHLORIDE 0.9 % IV SOLN
INTRAVENOUS | Status: DC
  Administered 2014-05-17: 5.4 [IU]/h via INTRAVENOUS
  Filled 2014-05-17: qty 2.5

## 2014-05-17 MED ORDER — SODIUM CHLORIDE 0.9 % IV SOLN
100.0000 mL | INTRAVENOUS | Status: DC | PRN
  Administered 2014-05-17: 100 mL via INTRAVENOUS

## 2014-05-17 MED ORDER — METOCLOPRAMIDE HCL 5 MG PO TABS
5.0000 mg | ORAL_TABLET | Freq: Three times a day (TID) | ORAL | Status: DC
  Administered 2014-05-18 – 2014-05-20 (×11): 5 mg via ORAL
  Filled 2014-05-17 (×15): qty 1

## 2014-05-17 MED ORDER — NEPRO/CARBSTEADY PO LIQD
237.0000 mL | ORAL | Status: DC | PRN
  Filled 2014-05-17: qty 237

## 2014-05-17 MED ORDER — ALBUTEROL SULFATE (2.5 MG/3ML) 0.083% IN NEBU
10.0000 mg | INHALATION_SOLUTION | Freq: Once | RESPIRATORY_TRACT | Status: DC
  Filled 2014-05-17: qty 12

## 2014-05-17 MED ORDER — SODIUM CHLORIDE 0.9 % IV SOLN: INTRAVENOUS | Status: DC

## 2014-05-17 MED ORDER — RENA-VITE PO TABS
1.0000 | ORAL_TABLET | Freq: Every day | ORAL | Status: DC
  Administered 2014-05-18 – 2014-05-20 (×4): 1 via ORAL
  Filled 2014-05-17 (×4): qty 1

## 2014-05-17 MED ORDER — LIDOCAINE HCL (PF) 1 % IJ SOLN: 5.0000 mL | INTRAMUSCULAR | Status: DC | PRN

## 2014-05-17 MED ORDER — HEPARIN SODIUM (PORCINE) 1000 UNIT/ML DIALYSIS
1000.0000 [IU] | INTRAMUSCULAR | Status: DC | PRN
  Filled 2014-05-17: qty 1

## 2014-05-17 MED ORDER — ZOLPIDEM TARTRATE 5 MG PO TABS
5.0000 mg | ORAL_TABLET | Freq: Every day | ORAL | Status: DC
  Administered 2014-05-19: 5 mg via ORAL
  Filled 2014-05-17: qty 1

## 2014-05-17 MED ORDER — CALCIUM GLUCONATE 10 % IV SOLN: 1.0000 g | Freq: Once | INTRAVENOUS | Status: DC

## 2014-05-17 MED ORDER — OXYCODONE-ACETAMINOPHEN 5-325 MG PO TABS
ORAL_TABLET | ORAL | Status: AC
  Administered 2014-05-17: 1 via ORAL
  Filled 2014-05-17: qty 1

## 2014-05-17 MED ORDER — HEPARIN SODIUM (PORCINE) 5000 UNIT/ML IJ SOLN
5000.0000 [IU] | Freq: Three times a day (TID) | INTRAMUSCULAR | Status: DC
  Administered 2014-05-18: 5000 [IU] via SUBCUTANEOUS
  Filled 2014-05-17 (×10): qty 1

## 2014-05-17 MED ORDER — ALBUTEROL SULFATE HFA 108 (90 BASE) MCG/ACT IN AERS: 2.0000 | INHALATION_SPRAY | RESPIRATORY_TRACT | Status: DC | PRN

## 2014-05-17 MED ORDER — SIMVASTATIN 20 MG PO TABS
20.0000 mg | ORAL_TABLET | Freq: Every day | ORAL | Status: DC
  Administered 2014-05-18 – 2014-05-19 (×3): 20 mg via ORAL
  Filled 2014-05-17 (×5): qty 1

## 2014-05-17 MED ORDER — METOPROLOL SUCCINATE ER 100 MG PO TB24
100.0000 mg | ORAL_TABLET | Freq: Every day | ORAL | Status: DC
  Administered 2014-05-18 – 2014-05-20 (×3): 100 mg via ORAL
  Filled 2014-05-17 (×3): qty 1

## 2014-05-17 MED ORDER — PANTOPRAZOLE SODIUM 40 MG PO TBEC
40.0000 mg | DELAYED_RELEASE_TABLET | Freq: Every day | ORAL | Status: DC
  Administered 2014-05-18 – 2014-05-19 (×3): 40 mg via ORAL
  Filled 2014-05-17 (×3): qty 1

## 2014-05-17 MED ORDER — MONTELUKAST SODIUM 10 MG PO TABS
10.0000 mg | ORAL_TABLET | Freq: Every day | ORAL | Status: DC
  Administered 2014-05-18 – 2014-05-19 (×3): 10 mg via ORAL
  Filled 2014-05-17 (×5): qty 1

## 2014-05-17 MED ORDER — INSULIN REGULAR HUMAN 100 UNIT/ML IJ SOLN: INTRAMUSCULAR | Status: DC

## 2014-05-17 MED ORDER — ONDANSETRON 4 MG PO TBDP
4.0000 mg | ORAL_TABLET | Freq: Four times a day (QID) | ORAL | Status: DC | PRN
  Filled 2014-05-17: qty 1

## 2014-05-17 MED ORDER — DEXTROSE-NACL 5-0.45 % IV SOLN
INTRAVENOUS | Status: DC
  Administered 2014-05-18: 01:00:00 via INTRAVENOUS

## 2014-05-17 MED ORDER — LIDOCAINE-PRILOCAINE 2.5-2.5 % EX CREA
1.0000 "application " | TOPICAL_CREAM | CUTANEOUS | Status: DC | PRN
  Filled 2014-05-17: qty 5

## 2014-05-17 MED ORDER — LEVOFLOXACIN 500 MG PO TABS
500.0000 mg | ORAL_TABLET | ORAL | Status: DC
  Administered 2014-05-18: 500 mg via ORAL
  Filled 2014-05-17 (×2): qty 1

## 2014-05-17 MED ORDER — ALBUTEROL SULFATE (2.5 MG/3ML) 0.083% IN NEBU: 3.0000 mL | INHALATION_SOLUTION | RESPIRATORY_TRACT | Status: DC | PRN

## 2014-05-17 MED ORDER — LIDOCAINE HCL 1 % IJ SOLN
INTRAMUSCULAR | Status: AC
  Filled 2014-05-17: qty 20

## 2014-05-17 MED ORDER — SODIUM CHLORIDE 0.9 % IV SOLN: 100.0000 mL | INTRAVENOUS | Status: DC | PRN

## 2014-05-17 MED ORDER — LIDOCAINE 5 % EX PTCH
1.0000 | MEDICATED_PATCH | CUTANEOUS | Status: DC
  Administered 2014-05-18 – 2014-05-19 (×3): 1 via TRANSDERMAL
  Filled 2014-05-17 (×4): qty 1

## 2014-05-17 MED ORDER — SODIUM CHLORIDE 0.9 % IV BOLUS (SEPSIS)
500.0000 mL | Freq: Once | INTRAVENOUS | Status: DC
  Administered 2014-05-19: 500 mL via INTRAVENOUS

## 2014-05-17 MED ORDER — GABAPENTIN 100 MG PO CAPS
100.0000 mg | ORAL_CAPSULE | Freq: Two times a day (BID) | ORAL | Status: DC
  Administered 2014-05-18 – 2014-05-20 (×6): 100 mg via ORAL
  Filled 2014-05-17 (×8): qty 1

## 2014-05-17 MED ORDER — PENTAFLUOROPROP-TETRAFLUOROETH EX AERO
1.0000 "application " | INHALATION_SPRAY | CUTANEOUS | Status: DC | PRN
  Filled 2014-05-17: qty 30

## 2014-05-17 MED ORDER — INSULIN ASPART 100 UNIT/ML ~~LOC~~ SOLN
10.0000 [IU] | Freq: Once | SUBCUTANEOUS | Status: AC
  Administered 2014-05-17: 10 [IU] via INTRAVENOUS
  Filled 2014-05-17: qty 1

## 2014-05-17 MED ORDER — ASPIRIN 325 MG PO TABS
325.0000 mg | ORAL_TABLET | Freq: Every day | ORAL | Status: DC
  Administered 2014-05-18 – 2014-05-20 (×3): 325 mg via ORAL
  Filled 2014-05-17 (×3): qty 1

## 2014-05-17 MED ORDER — CLONAZEPAM 1 MG PO TABS
1.0000 mg | ORAL_TABLET | Freq: Every day | ORAL | Status: DC
  Administered 2014-05-18 – 2014-05-19 (×3): 1 mg via ORAL
  Filled 2014-05-17 (×2): qty 2
  Filled 2014-05-17: qty 1

## 2014-05-17 MED ORDER — OXYCODONE-ACETAMINOPHEN 5-325 MG PO TABS
1.0000 | ORAL_TABLET | Freq: Four times a day (QID) | ORAL | Status: DC | PRN
  Administered 2014-05-17 – 2014-05-20 (×3): 1 via ORAL
  Filled 2014-05-17 (×2): qty 1

## 2014-05-17 MED ORDER — SODIUM BICARBONATE 8.4 % IV SOLN: 50.0000 meq | Freq: Once | INTRAVENOUS | Status: DC

## 2014-05-17 NOTE — Progress Notes (Signed)
ABG drawn was venous sample, RT made MD aware. Okay with these results.

## 2014-05-17 NOTE — ED Notes (Signed)
Pt off unit to get Picc line

## 2014-05-17 NOTE — Procedures (Signed)
I have seen and examined this patient and agree with the plan of care. Seen on dialysis with no complaints Brinnley Lacap W 05/17/2014, 8:28 PM

## 2014-05-17 NOTE — ED Notes (Signed)
Alice Rieger MD aware of abnormal lab test results

## 2014-05-17 NOTE — ED Notes (Signed)
CBG HIGH > 600

## 2014-05-17 NOTE — Progress Notes (Signed)
Referring Provider: No ref. provider found Primary Care Physician:  [name], MD Primary Nephrologist:  Dr. Justin Mend  Reason for Consultation:  Medical management of ESRD  Life threatening hyperkalemia and hyperglycemia   HPI: HPI: [name] is a 43 y.o. 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 the emergency room, patient was found to be profoundly hyperglycemic, she had an VBG which showed a pH of 7.15. Patient has very poor peripheral access, and she needed to go urgent PICC line placement by interventional radiology and when I evaluated patient, she was not yet started on an insulin drip or any IV fluids. Her labs were pertinent for marked hyperkalemia with a potassium of 8.3 In view of the life threatening hyperkalemia, I have  recommended acute hemodialysis tonight emergently. She states she has been feeling unwell for several days and not eating well. She also states that her sugars have remained uncontrolled despite sliding scale coverage. She also shared with me her sadness in the anniversary of the death of her mother  Past Medical History  Diagnosis Date  . Hyperlipidemia   . Alopecia   . Obesity   . Iron deficiency   . ESRD (end stage renal disease)     S/p pancreatic and kidney transplant, but back on HD 2008, dialysis at [facility], [place], Sat  . RLS (restless legs syndrome)   . Injury of conjunctiva and corneal abrasion of right eye without foreign body   . Cellulitis   . Anemia   . Blood transfusion 2004    "when I had my transplant"  . Migraine   . Hyperparathyroidism, secondary   . Diabetic retinopathy   . Hypertension     takes  Metoprolol and Exforge daily  . Depression     takes Klonopin nightly  . Diabetes mellitus type 1     Pt states diagnosed at age 64 with prior episodes of DKA. S/p pancreatic transplant   . GERD (gastroesophageal reflux disease)     takes Protonix daily  . Bronchitis   . Migraine   . Asthma   . Emphysema of lung   . Angina     none in past 2 years.  . Stomach pain     Hx: of chronic  . Pneumonia 2012    "double"  . Dialysis patient     tues,thurs,sat  . 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  Surgery was done at [facility].       Past Surgical History  Procedure Laterality Date  . 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 Thyroglobulin      x 7  . Av fistula placement      right upper arm  . Cholecystectomy  1995  .  hd graft placement/removal      "had 2 in my left upper arm"  . Eye surgery    . Retinopathy surgery    . Tooth extraction  10/10/11    X's two  .  Insertion of dialysis catheter  12/08/2011    Procedure: INSERTION OF DIALYSIS CATHETER;  Surgeon: Angelia Mould, MD;  Location: [place];  Service: Vascular;  Laterality: Left;  . Av fistula placement  01/22/2012    Procedure: INSERTION OF ARTERIOVENOUS (AV) GORE-TEX GRAFT ARM;  Surgeon: Angelia Mould, MD;  Location: [place];  Service: Vascular;  Laterality: Left;  Ultrasound guided.  . Av fistula placement  03/14/2012    Procedure: INSERTION OF ARTERIOVENOUS (AV) GORE-TEX GRAFT THIGH;  Surgeon: Angelia Mould, MD;  Location: [place];  Service: Vascular;  Laterality: Right;  . False aneurysm repair Right 01/02/2013    Procedure: Excision of lymphocele in right thigh.;  Surgeon: Angelia Mould, MD;  Location: [place];  Service: Vascular;  Laterality: Right;  .  Carpal tunnel release Right 03/02/2013    Procedure: CARPAL TUNNEL RELEASE;  Surgeon: Tennis Must, MD;  Location: [place];  Service: Orthopedics;  Laterality: Right;  . Flexible sigmoidoscopy N/A 03/24/2013    Procedure: FLEXIBLE SIGMOIDOSCOPY;  Surgeon: Cleotis Nipper, MD;  Location: [facility] ENDOSCOPY;  Service: Endoscopy;  Laterality: N/A;  . Revision of arteriovenous goretex graft Right 04/02/2013    Procedure: REPAIR OF PSEUDOANEURYSM OF ARTERIOVENOUS GORETEX GRAFT;  Surgeon: Angelia Mould, MD;  Location: [place];  Service: Vascular;  Laterality: Right;  . Carpal tunnel release Left 10/16/2013    Procedure: LEFT CARPAL TUNNEL RELEASE;  Surgeon: Tennis Must, MD;  Location: [place];  Service: Orthopedics;  Laterality: Left;  . Unilateral upper extremeity angiogram N/A 11/12/2011    Procedure: UNILATERAL UPPER Anselmo Rod;  Surgeon: Angelia Mould, MD;  Location: [facility] CATH LAB;  Service: Cardiovascular;  Laterality: N/A;  . Fistulogram Left 03/03/2012    Procedure: FISTULOGRAM;  Surgeon: Angelia Mould, MD;  Location: [facility] CATH LAB;  Service: Cardiovascular;  Laterality: Left;    Prior to Admission medications   Medication Sig Start Date End Date Taking? Authorizing Provider  albuterol (PROVENTIL HFA;VENTOLIN HFA) 108 (90 BASE) MCG/ACT inhaler Inhale 2 puffs into the lungs every 4 (four) hours as needed for wheezing or shortness of breath (as per home regimen). 02/20/13  Yes Kinnie Feil, MD  aspirin 325 MG tablet Take 1 tablet (325 mg total) by mouth daily. 04/20/14  Yes Domenic Polite, MD  calcium acetate (PHOSLO) 667 MG capsule Take 2,001 mg by mouth 3 (three) times daily with meals.   Yes Historical Provider, MD  clonazePAM (KLONOPIN) 0.5 MG tablet Take 1 mg by mouth at bedtime.    Yes Historical Provider, MD  gabapentin (NEURONTIN) 100 MG capsule Take 100 mg by mouth 2 (two) times daily.    Yes Historical Provider, MD  insulin  aspart (NOVOLOG) 100 UNIT/ML injection Inject 0-9 Units into the skin 3 (three) times daily with meals. 04/20/14  Yes Domenic Polite, MD  Insulin Glargine (LANTUS) 100 UNIT/ML Solostar Pen Inject 7 Units into the skin 2 (two) times daily with a meal. 01/22/14  Yes Allie Bossier, MD  lidocaine-prilocaine (EMLA) cream Apply 1 application topically 3 (three) times a week. Used for dialysis 10/24/12  Yes Historical Provider, MD  metoCLOPramide (REGLAN) 5 MG tablet Take 1 tablet (5 mg total) by mouth 4 (four) times daily -  before meals and at bedtime. 12/01/13  Yes Janece Canterbury, MD  lidocaine (LIDODERM) 5 % Place 1 patch onto the skin daily. Remove & Discard patch within 12 hours or as directed by MD 05/04/14  Freeman Caldron Baker, PA-C  metoprolol succinate (TOPROL-XL) 100 MG 24 hr tablet Take 100 mg by mouth daily. Take with or immediately following a meal.    Historical Provider, MD  montelukast (SINGULAIR) 10 MG tablet Take 10 mg by mouth at bedtime.    Historical Provider, MD  multivitamin (RENA-VIT) TABS tablet Take 1 tablet by mouth daily.    Historical Provider, MD  Nutritional Supplements (FEEDING SUPPLEMENT, NEPRO CARB STEADY,) LIQD Take 237 mLs by mouth 2 (two) times daily between meals. 04/23/14   Orson Eva, MD  ondansetron (ZOFRAN-ODT) 4 MG disintegrating tablet Take 1 tablet (4 mg total) by mouth every 6 (six) hours as needed for nausea or vomiting. 12/01/13   Janece Canterbury, MD  oxyCODONE-acetaminophen (PERCOCET/ROXICET) 5-325 MG per tablet Take 1 tablet by mouth every 6 (six) hours as needed for moderate pain or severe pain. 05/04/14   Freeman Caldron Baker, PA-C  pantoprazole (PROTONIX) 40 MG tablet Take 1 tablet (40 mg total) by mouth at bedtime. 03/27/13   Hosie Poisson, MD  simvastatin (ZOCOR) 20 MG tablet Take 1 tablet (20 mg total) by mouth at bedtime. 04/20/14   Domenic Polite, MD  zolpidem (AMBIEN) 5 MG tablet Take 5 mg by mouth at bedtime.    Historical Provider, MD    Current  Facility-Administered Medications  Medication Dose Route Frequency Provider Last Rate Last Dose  . 0.9 %  sodium chloride infusion  100 mL Intravenous PRN Sherril Croon, MD      . 0.9 %  sodium chloride infusion  100 mL Intravenous PRN Sherril Croon, MD 999 mL/hr at 05/17/14 1813 100 mL at 05/17/14 1813  . 0.9 %  sodium chloride infusion   Intravenous Continuous Costin Karlyne Greenspan, MD      . albuterol (PROVENTIL HFA;VENTOLIN HFA) 108 (90 BASE) MCG/ACT inhaler 2 puff  2 puff Inhalation Q4H PRN Costin Karlyne Greenspan, MD      . albuterol (PROVENTIL) (2.5 MG/3ML) 0.083% nebulizer solution 10 mg  10 mg Nebulization Once Charlott Rakes, MD   10 mg at 05/17/14 1758  . alteplase (CATHFLO ACTIVASE) injection 2 mg  2 mg Intracatheter Once PRN Sherril Croon, MD      . Derrill Memo ON 05/18/2014] aspirin tablet 325 mg  325 mg Oral Daily Costin Karlyne Greenspan, MD      . calcium gluconate inj 10% (1 g) URGENT USE ONLY!  1 g Intravenous Once Charlott Rakes, MD      . clonazePAM (KLONOPIN) tablet 1 mg  1 mg Oral QHS Costin Karlyne Greenspan, MD      . dextrose 5 %-0.45 % sodium chloride infusion   Intravenous Continuous Costin Karlyne Greenspan, MD      . feeding supplement (NEPRO CARB STEADY) liquid 237 mL  237 mL Oral PRN Sherril Croon, MD      . gabapentin (NEURONTIN) capsule 100 mg  100 mg Oral BID Caren Griffins, MD      . heparin injection 1,000 Units  1,000 Units Dialysis PRN Sherril Croon, MD      . heparin injection 5,000 Units  5,000 Units Subcutaneous 3 times per day Costin Karlyne Greenspan, MD      . insulin regular (NOVOLIN R,HUMULIN R) 250 Units in sodium chloride 0.9 % 250 mL (1 Units/mL) infusion   Intravenous Continuous Charlott Rakes, MD 5.4 mL/hr at 05/17/14 1813 5.4 Units/hr at 05/17/14 1813  . levofloxacin (LEVAQUIN) tablet 500 mg  500 mg Oral Q48H Costin Karlyne Greenspan,  MD      . lidocaine (LIDODERM) 5 % 1 patch  1 patch Transdermal Q24H Costin Karlyne Greenspan, MD      . lidocaine (PF) (XYLOCAINE) 1 % injection 5 mL  5 mL Intradermal PRN Sherril Croon, MD      . lidocaine (XYLOCAINE) 1 % (with pres) injection           . lidocaine-prilocaine (EMLA) cream 1 application  1 application Topical PRN Sherril Croon, MD      . metoCLOPramide (REGLAN) tablet 5 mg  5 mg Oral TID AC & HS Costin Karlyne Greenspan, MD      . Derrill Memo ON 05/18/2014] metoprolol succinate (TOPROL-XL) 24 hr tablet 100 mg  100 mg Oral Daily Costin Karlyne Greenspan, MD      . montelukast (SINGULAIR) tablet 10 mg  10 mg Oral QHS Costin Karlyne Greenspan, MD      . multivitamin (RENA-VIT) tablet 1 tablet  1 tablet Oral Daily Costin Karlyne Greenspan, MD      . ondansetron (ZOFRAN-ODT) disintegrating tablet 4 mg  4 mg Oral Q6H PRN Costin Karlyne Greenspan, MD      . oxyCODONE-acetaminophen (PERCOCET/ROXICET) 5-325 MG per tablet 1 tablet  1 tablet Oral Q6H PRN Caren Griffins, MD   1 tablet at 05/17/14 1927  . pantoprazole (PROTONIX) EC tablet 40 mg  40 mg Oral QHS Costin Karlyne Greenspan, MD      . pentafluoroprop-tetrafluoroeth Landry Dyke) aerosol 1 application  1 application Topical PRN Sherril Croon, MD      . promethazine (PHENERGAN) 25 MG/ML injection           . simvastatin (ZOCOR) tablet 20 mg  20 mg Oral QHS Costin Karlyne Greenspan, MD      . sodium bicarbonate injection 50 mEq  50 mEq Intravenous Once Charlott Rakes, MD      . sodium chloride 0.9 % bolus 500 mL  500 mL Intravenous Once Charlott Rakes, MD      . zolpidem (AMBIEN) tablet 5 mg  5 mg Oral QHS Costin Karlyne Greenspan, MD       Current Outpatient Prescriptions  Medication Sig Dispense Refill  . albuterol (PROVENTIL HFA;VENTOLIN HFA) 108 (90 BASE) MCG/ACT inhaler Inhale 2 puffs into the lungs every 4 (four) hours as needed for wheezing or shortness of breath (as per home regimen). 1 Inhaler 0  . aspirin 325 MG tablet Take 1 tablet (325 mg total) by mouth daily.    . calcium acetate (PHOSLO) 667 MG capsule Take 2,001 mg by mouth 3 (three) times daily with meals.    . clonazePAM (KLONOPIN) 0.5 MG tablet Take 1 mg by mouth at bedtime.     . gabapentin (NEURONTIN) 100 MG  capsule Take 100 mg by mouth 2 (two) times daily.     . insulin aspart (NOVOLOG) 100 UNIT/ML injection Inject 0-9 Units into the skin 3 (three) times daily with meals. 10 mL 0  . Insulin Glargine (LANTUS) 100 UNIT/ML Solostar Pen Inject 7 Units into the skin 2 (two) times daily with a meal. 15 mL 11  . lidocaine-prilocaine (EMLA) cream Apply 1 application topically 3 (three) times a week. Used for dialysis    . metoCLOPramide (REGLAN) 5 MG tablet Take 1 tablet (5 mg total) by mouth 4 (four) times daily -  before meals and at bedtime. 60 tablet 0  . lidocaine (LIDODERM) 5 % Place 1 patch onto the skin daily. Remove & Discard patch within 12 hours  or as directed by MD 30 patch 0  . metoprolol succinate (TOPROL-XL) 100 MG 24 hr tablet Take 100 mg by mouth daily. Take with or immediately following a meal.    . montelukast (SINGULAIR) 10 MG tablet Take 10 mg by mouth at bedtime.    . multivitamin (RENA-VIT) TABS tablet Take 1 tablet by mouth daily.    . Nutritional Supplements (FEEDING SUPPLEMENT, NEPRO CARB STEADY,) LIQD Take 237 mLs by mouth 2 (two) times daily between meals. 60 Can 0  . ondansetron (ZOFRAN-ODT) 4 MG disintegrating tablet Take 1 tablet (4 mg total) by mouth every 6 (six) hours as needed for nausea or vomiting. 20 tablet 0  . oxyCODONE-acetaminophen (PERCOCET/ROXICET) 5-325 MG per tablet Take 1 tablet by mouth every 6 (six) hours as needed for moderate pain or severe pain. 15 tablet 0  . pantoprazole (PROTONIX) 40 MG tablet Take 1 tablet (40 mg total) by mouth at bedtime. 30 tablet 0  . simvastatin (ZOCOR) 20 MG tablet Take 1 tablet (20 mg total) by mouth at bedtime. 30 tablet 0  . zolpidem (AMBIEN) 5 MG tablet Take 5 mg by mouth at bedtime.    . [DISCONTINUED] NORVASC 10 MG tablet Take 1 tablet by mouth Daily.      Allergies as of 05/17/2014 - Review Complete 05/17/2014  Allergen Reaction Noted  . Depakote [divalproex sodium] Other (See Comments) 09/03/2012  . Morphine and related  Nausea And Vomiting and Other (See Comments) 01/18/2011  . Penicillins Anaphylaxis 04/23/2011  . Tramadol Nausea And Vomiting 01/18/2011  . Imitrex [sumatriptan] Other (See Comments) 05/16/2013  . Vicodin [hydrocodone-acetaminophen] Itching and Rash 12/06/2011    Family History  Problem Relation Age of Onset  . Hypertension Mother   . Kidney disease Mother   . Diabetes Mother   . Hypertension Father   . Kidney disease Father   . Diabetes Father     History   Social History  . Marital Status: Single    Spouse Name: N/A    Number of Children: N/A  . Years of Education: N/A   Occupational History  . disabled    Social History Main Topics  . Smoking status: Never Smoker   . Smokeless tobacco: Never Used  . Alcohol Use: No  . Drug Use: No  . Sexual Activity: Yes    Birth Control/ Protection: None   Other Topics Concern  . Not on file   Social History Narrative   Lives at home with fiance, has a 54yo daughter. Ambulatory without cane or walker. Never smoker, no drugs or alcohol. Lost mom last year, has no brothers or sisters.     Review of Systems: Gen: Denies any fever, chills, sweats, anorexia, fatigue, weakness, malaise, weight loss, and sleep disorder HEENT: + Retinopathy CV: Denies chest pain, angina, palpitations, syncope, orthopnea, PND, peripheral edema, and claudication. Resp: Denies dyspnea at rest, dyspnea with exercise, cough, sputum, wheezing, coughing up blood, and pleurisy. GI: Denies vomiting blood, jaundice, and fecal incontinence.   Denies dysphagia or odynophagia. GU : Denies urinary burning, blood in urine, urinary frequency, urinary hesitancy, nocturnal urination, and urinary incontinence.  No renal calculi. MS: Denies joint pain, limitation of movement, and swelling, stiffness, low back pain, extremity pain. Denies muscle weakness, cramps, atrophy.  No use of non steroidal antiinflammatory drugs. Derm: Denies rash, itching, dry skin, hives, moles,  warts, or unhealing ulcers.  Psych: + depression, anxiety, memory loss, suicidal ideation, hallucinations, paranoia, and confusion. Heme: Denies bruising, bleeding, and enlarged  lymph nodes. Neuro: No headache.  No diplopia. No dysarthria.  No dysphasia.  No history of CVA.  No Seizures. No paresthesias.  No weakness. Endocrine Diabetes  No Thyroid disease.  No Adrenal disease.  Physical Exam: Vital signs in last 24 hours: Temp:  [97.6 F (36.4 C)-98.2 F (36.8 C)] 98.2 F (36.8 C) (12/21 1821) Pulse Rate:  [80-94] 85 (12/21 2000) Resp:  [15-32] 19 (12/21 2000) BP: (112-154)/(52-70) 134/61 mmHg (12/21 2000) SpO2:  [96 %-100 %] 96 % (12/21 1836)   General:   Chronically ill appearing Head:  Normocephalic and atraumatic. Eyes:  Sclera clear, no icterus.   Conjunctiva pink. Ears:  Normal auditory acuity. Nose:  No deformity, discharge,  or lesions. Mouth:  No deformity or lesions, dentition normal. Neck:  Supple; no masses or thyromegaly. JVP not elevated Lungs:  Clear throughout to auscultation.   No wheezes, crackles, or rhonchi. No acute distress. Heart:  Regular rate and rhythm; no murmurs, clicks, rubs,  or gallops. Abdomen:  Soft, nontender and nondistended. No masses, hepatosplenomegaly or hernias noted. Normal bowel sounds, without guarding, and without rebound.   Msk:  Symmetrical without gross deformities. Normal posture. Pulses:  No carotid, renal, femoral bruits. DP and PT symmetrical and equal Extremities:  Without clubbing or edema.    Intake/Output from previous day:   Intake/Output this shift:    Lab Results:  Recent Labs  05/17/14 1509 05/17/14 1618  WBC 7.3  --   HGB 11.8* 15.6*  HCT 39.7 46.0  PLT 142*  --    BMET  Recent Labs  05/17/14 1509 05/17/14 1618 05/17/14 1806  NA 114* 112* 117*  K 7.5* 8.3* 5.6*  CL 72* 84* 75*  CO2 12*  --  15*  GLUCOSE 1079* >700* 1237*  BUN 51* 67* 55*  CREATININE 9.12* 9.80* 9.44*  CALCIUM 7.4*  --  6.9*    LFT No results for input(s): PROT, ALBUMIN, AST, ALT, ALKPHOS, BILITOT, BILIDIR, IBILI in the last 72 hours. PT/INR No results for input(s): LABPROT, INR in the last 72 hours. Hepatitis Panel No results for input(s): HEPBSAG, HCVAB, HEPAIGM, HEPBIGM in the last 72 hours.  Studies/Results: Dg Chest 2 View  05/17/2014   CLINICAL DATA:  Hyperglycemia for 3 days. History of diabetes. Initial encounter.  EXAM: CHEST  2 VIEW  COMPARISON:  04/13/2014 and 04/14/2014.  FINDINGS: 1747 hr. Right IJ central venous catheter projects to the SVC right atrial level. The heart is enlarged. Pulmonary edema has mildly worsened. There is no confluent airspace opacity, significant pleural effusion or pneumothorax. The bones appear stable. Telemetry leads overlie the upper abdomen.  IMPRESSION: Worsened pulmonary edema consistent with congestive heart failure or volume overload. Stable cardiomegaly.   Electronically Signed   By: Camie Patience M.D.   On: 05/17/2014 18:14   Ir Fluoro Guide Cv Line Right  05/17/2014   CLINICAL DATA:  End-stage renal disease, hyperglycemia  EXAM: RIGHT INTERNAL JUGULAR PICC LINE PLACEMENT WITH ULTRASOUND AND FLUOROSCOPIC GUIDANCE  FLUOROSCOPY TIME:  30 SECONDS  PROCEDURE: The patient was advised of the possible risks andcomplications and agreed to undergo the procedure. The patient was then brought to the angiographic suite for the procedure.  The RIGHT NECK was prepped with chlorhexidine, drapedin the usual sterile fashion using maximum barrier technique (cap and mask, sterile gown, sterile gloves, large sterile sheet, hand hygiene and cutaneous antisepsis) and infiltrated locally with 1% Lidocaine.  Ultrasound demonstrated patency of the right internal jugular vein, and this was documented  with an image. Under real-time ultrasound guidance, this vein was accessed with a 21 gauge micropuncture needle and image documentation was performed. A 0.018 wire was introduced in to the vein. Over  this, a 5 [place] double lumen power PICC was advanced to the lower SVC/right atrial junction. Fluoroscopy during the procedure and fluoro spot radiograph confirms appropriate catheter position. The catheter was flushed and covered with asterile dressing.  Catheter length: 15  Complications: None immediate  IMPRESSION: Successful right internal jugular double-lumen Power PICC line placement with ultrasound and fluoroscopic guidance. The catheter is ready for use.   Electronically Signed   By: Daryll Brod M.D.   On: 05/17/2014 17:10   Ir US Guide Vasc Access Right  05/17/2014   CLINICAL DATA:  End-stage renal disease, hyperglycemia  EXAM: RIGHT INTERNAL JUGULAR PICC LINE PLACEMENT WITH ULTRASOUND AND FLUOROSCOPIC GUIDANCE  FLUOROSCOPY TIME:  30 SECONDS  PROCEDURE: The patient was advised of the possible risks andcomplications and agreed to undergo the procedure. The patient was then brought to the angiographic suite for the procedure.  The RIGHT NECK was prepped with chlorhexidine, drapedin the usual sterile fashion using maximum barrier technique (cap and mask, sterile gown, sterile gloves, large sterile sheet, hand hygiene and cutaneous antisepsis) and infiltrated locally with 1% Lidocaine.  Ultrasound demonstrated patency of the right internal jugular vein, and this was documented with an image. Under real-time ultrasound guidance, this vein was accessed with a 21 gauge micropuncture needle and image documentation was performed. A 0.018 wire was introduced in to the vein. Over this, a 5 [place] double lumen power PICC was advanced to the lower SVC/right atrial junction. Fluoroscopy during the procedure and fluoro spot radiograph confirms appropriate catheter position. The catheter was flushed and covered with asterile dressing.  Catheter length: 15  Complications: None immediate  IMPRESSION: Successful right internal jugular double-lumen Power PICC line placement with ultrasound and fluoroscopic guidance. The  catheter is ready for use.   Electronically Signed   By: Daryll Brod M.D.   On: 05/17/2014 17:10    Assessment/Plan:  ESRD-  TTS dialysis   ANEMIA- no ESA  MBD-Check phosphorus  HTN/VOL- controlled  ACCESS- Thight graft       OTHER- Depression anniversary of mothers death 3 years ago  LOS: 0 Abbott Jasinski W @TODAY @8 :18 PM

## 2014-05-17 NOTE — ED Provider Notes (Signed)
CSN: [id number]     Arrival date & time 05/17/14  1436 History   First MD Initiated Contact with Patient 05/17/14 1440     Chief Complaint  Patient presents with  . Hyperglycemia     HPI  Ms. Jenetta Downer' Nori Riis is a 43 year old female with ESRD, asthma, DM1, h/o CVA  who presents today was possible DKA. She reports her CBG at home has been running >600-700 for the last 2-3 days. She also reports associated decreased appetite, nausea, vomiting, cough. Home insulin is Lantus 7u BID & Novolog 6u BID. She denies missing any doses of her regular medications, sore throat, sick contacts, recent illness, fever, abdominal pain.    Past Medical History  Diagnosis Date  . Hyperlipidemia   . Alopecia   . Obesity   . Iron deficiency   . ESRD (end stage renal disease)     S/p pancreatic and kidney transplant, but back on HD 2008, dialysis at [facility], [place], Sat  . RLS (restless legs syndrome)   . Injury of conjunctiva and corneal abrasion of right eye without foreign body   . Cellulitis   . Anemia   . Blood transfusion 2004    "when I had my transplant"  . Migraine   . Hyperparathyroidism, secondary   . Diabetic retinopathy   . Hypertension     takes Metoprolol and Exforge daily  . Depression     takes Klonopin nightly  . Diabetes mellitus type 1     Pt states diagnosed at age 64 with prior episodes of DKA. S/p pancreatic transplant   . GERD (gastroesophageal reflux disease)     takes Protonix daily  . Bronchitis   . Migraine   . Asthma   . Emphysema of lung   . Angina     none in past 2 years.  . Stomach pain     Hx: of chronic  . Pneumonia 2012    "double"  . Dialysis patient     tues,thurs,sat  . 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  Surgery was done at [place].       Past Surgical History  Procedure Laterality Date  . 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 Thyroglobulin      x 7  . Av fistula placement      right upper arm  . Cholecystectomy  1995  .  hd graft placement/removal      "had 2 in my left upper arm"  . Eye surgery    . Retinopathy surgery    . Tooth extraction  10/10/11    X's two  . Insertion of dialysis catheter  12/08/2011    Procedure: INSERTION OF DIALYSIS CATHETER;  Surgeon: Angelia Mould, MD;  Location: [place];  Service: Vascular;  Laterality: Left;  . Av fistula placement  01/22/2012    Procedure: INSERTION OF ARTERIOVENOUS (AV) GORE-TEX GRAFT ARM;  Surgeon: Angelia Mould, MD;  Location: [place];  Service: Vascular;  Laterality: Left;  Ultrasound guided.  . Av fistula placement  03/14/2012    Procedure: INSERTION OF ARTERIOVENOUS (AV) GORE-TEX GRAFT THIGH;  Surgeon: Angelia Mould, MD;  Location: [place];  Service: Vascular;  Laterality: Right;  . False aneurysm repair Right  01/02/2013    Procedure: Excision of lymphocele in right thigh.;  Surgeon: Angelia Mould, MD;  Location: [place];  Service: Vascular;  Laterality: Right;  . Carpal tunnel release Right 03/02/2013    Procedure: CARPAL TUNNEL RELEASE;  Surgeon: Tennis Must, MD;  Location: [place];  Service: Orthopedics;  Laterality: Right;  . Flexible sigmoidoscopy N/A 03/24/2013    Procedure: FLEXIBLE SIGMOIDOSCOPY;  Surgeon: Cleotis Nipper, MD;  Location: [facility] ENDOSCOPY;  Service: Endoscopy;  Laterality: N/A;  . Revision of arteriovenous goretex graft Right 04/02/2013    Procedure: REPAIR OF PSEUDOANEURYSM OF ARTERIOVENOUS GORETEX GRAFT;  Surgeon: Angelia Mould, MD;  Location: [place];  Service: Vascular;  Laterality: Right;  . Carpal tunnel release Left 10/16/2013    Procedure: LEFT CARPAL TUNNEL RELEASE;  Surgeon: Tennis Must, MD;  Location: [place];  Service: Orthopedics;   Laterality: Left;  . Unilateral upper extremeity angiogram N/A 11/12/2011    Procedure: UNILATERAL UPPER Anselmo Rod;  Surgeon: Angelia Mould, MD;  Location: [facility] CATH LAB;  Service: Cardiovascular;  Laterality: N/A;  . Fistulogram Left 03/03/2012    Procedure: FISTULOGRAM;  Surgeon: Angelia Mould, MD;  Location: [facility] CATH LAB;  Service: Cardiovascular;  Laterality: Left;   Family History  Problem Relation Age of Onset  . Hypertension Mother   . Kidney disease Mother   . Diabetes Mother   . Hypertension Father   . Kidney disease Father   . Diabetes Father    History  Substance Use Topics  . Smoking status: Never Smoker   . Smokeless tobacco: Never Used  . Alcohol Use: No   OB History    No data available     Review of Systems  Constitutional: Positive for appetite change. Negative for fever.  Respiratory: Positive for cough, chest tightness and shortness of breath.   Cardiovascular: Negative for chest pain.  Gastrointestinal: Positive for nausea and vomiting. Negative for abdominal pain.  Neurological: Positive for weakness and numbness.      Allergies  Depakote; Morphine and related; Penicillins; Tramadol; Imitrex; and Vicodin  Home Medications   Prior to Admission medications   Medication Sig Start Date End Date Taking? Authorizing Provider  albuterol (PROVENTIL HFA;VENTOLIN HFA) 108 (90 BASE) MCG/ACT inhaler Inhale 2 puffs into the lungs every 4 (four) hours as needed for wheezing or shortness of breath (as per home regimen). 02/20/13  Yes Kinnie Feil, MD  aspirin 325 MG tablet Take 1 tablet (325 mg total) by mouth daily. 04/20/14  Yes Domenic Polite, MD  calcium acetate (PHOSLO) 667 MG capsule Take 2,001 mg by mouth 3 (three) times daily with meals.   Yes Historical Provider, MD  clonazePAM (KLONOPIN) 0.5 MG tablet Take 1 mg by mouth at bedtime.    Yes Historical Provider, MD  gabapentin (NEURONTIN) 100 MG capsule Take 100 mg by mouth 2 (two)  times daily.    Yes Historical Provider, MD  insulin aspart (NOVOLOG) 100 UNIT/ML injection Inject 0-9 Units into the skin 3 (three) times daily with meals. 04/20/14  Yes Domenic Polite, MD  Insulin Glargine (LANTUS) 100 UNIT/ML Solostar Pen Inject 7 Units into the skin 2 (two) times daily with a meal. 01/22/14  Yes Allie Bossier, MD  lidocaine-prilocaine (EMLA) cream Apply 1 application topically 3 (three) times a week. Used for dialysis 10/24/12  Yes Historical Provider, MD  metoCLOPramide (REGLAN) 5 MG tablet Take 1 tablet (5 mg total) by mouth 4 (four) times  daily -  before meals and at bedtime. 12/01/13  Yes Janece Canterbury, MD  lidocaine (LIDODERM) 5 % Place 1 patch onto the skin daily. Remove & Discard patch within 12 hours or as directed by MD 05/04/14   Liam Graham, PA-C  metoprolol succinate (TOPROL-XL) 100 MG 24 hr tablet Take 100 mg by mouth daily. Take with or immediately following a meal.    Historical Provider, MD  montelukast (SINGULAIR) 10 MG tablet Take 10 mg by mouth at bedtime.    Historical Provider, MD  multivitamin (RENA-VIT) TABS tablet Take 1 tablet by mouth daily.    Historical Provider, MD  Nutritional Supplements (FEEDING SUPPLEMENT, NEPRO CARB STEADY,) LIQD Take 237 mLs by mouth 2 (two) times daily between meals. 04/23/14   Orson Eva, MD  ondansetron (ZOFRAN-ODT) 4 MG disintegrating tablet Take 1 tablet (4 mg total) by mouth every 6 (six) hours as needed for nausea or vomiting. 12/01/13   Janece Canterbury, MD  oxyCODONE-acetaminophen (PERCOCET/ROXICET) 5-325 MG per tablet Take 1 tablet by mouth every 6 (six) hours as needed for moderate pain or severe pain. 05/04/14   Freeman Caldron Baker, PA-C  pantoprazole (PROTONIX) 40 MG tablet Take 1 tablet (40 mg total) by mouth at bedtime. 03/27/13   Hosie Poisson, MD  simvastatin (ZOCOR) 20 MG tablet Take 1 tablet (20 mg total) by mouth at bedtime. 04/20/14   Domenic Polite, MD  zolpidem (AMBIEN) 5 MG tablet Take 5 mg by mouth at bedtime.     Historical Provider, MD   BP 135/60 mmHg  Pulse 84  Temp(Src) 97.6 F (36.4 C) (Oral)  Resp 16  SpO2 100%  LMP 05/12/2014 Physical Exam  Constitutional: She is oriented to person, place, and time. She appears well-developed and well-nourished. No distress.  Wearing  (3-4OL)  HENT:  Head: Normocephalic and atraumatic.  Mouth/Throat: Oropharynx is clear and moist. No oropharyngeal exudate.  Disconjugate gaze of L eye.  Eyes: Conjunctivae are normal. Pupils are equal, round, and reactive to light.  Cardiovascular: Normal rate and regular rhythm.  Exam reveals no gallop and no friction rub.   No murmur heard. Pulmonary/Chest: Effort normal and breath sounds normal. No respiratory distress. She has no wheezes. She has no rales.  Abdominal: Soft. Bowel sounds are normal. She exhibits no distension. There is no tenderness. There is no rebound.  Neurological: She is alert and oriented to person, place, and time. No cranial nerve deficit.  CN II-XII intact, grip strength L>R, UE/LE strength 4/5 L & 3/5 R. 2+ brachial/patellar reflexes.   Skin: She is not diaphoretic.    ED Course  Procedures (including critical care time) Labs Review Labs Reviewed  BASIC METABOLIC PANEL - Abnormal; Notable for the following:    Sodium 114 (*)    Potassium 7.5 (*)    Chloride 72 (*)    CO2 12 (*)    Glucose, Bld 1079 (*)    BUN 51 (*)    Creatinine, Ser 9.12 (*)    Calcium 7.4 (*)    GFR calc non Af Amer 5 (*)    GFR calc Af Amer 6 (*)    Anion gap 30 (*)    All other components within normal limits  CBC WITH DIFFERENTIAL - Abnormal; Notable for the following:    RBC 3.57 (*)    Hemoglobin 11.8 (*)    MCV 111.2 (*)    MCHC 29.7 (*)    RDW 16.0 (*)    Platelets 142 (*)  All other components within normal limits  CBG MONITORING, ED - Abnormal; Notable for the following:    Glucose-Capillary >600 (*)    All other components within normal limits  I-STAT CHEM 8, ED - Abnormal; Notable for  the following:    Sodium 112 (*)    Potassium 8.3 (*)    Chloride 84 (*)    BUN 67 (*)    Creatinine, Ser 9.80 (*)    Glucose, Bld >700 (*)    Calcium, Ion 0.87 (*)    Hemoglobin 15.6 (*)    All other components within normal limits  I-STAT VENOUS BLOOD GAS, ED - Abnormal; Notable for the following:    pH, Ven 7.156 (*)    pCO2, Ven 40.8 (*)    Bicarbonate 14.4 (*)    Acid-base deficit 14.0 (*)    All other components within normal limits  URINALYSIS, ROUTINE W REFLEX MICROSCOPIC  I-STAT TROPOININ, ED  I-STAT ARTERIAL BLOOD GAS, ED   Imaging Review Ir Fluoro Guide Cv Line Right  05/17/2014   CLINICAL DATA:  End-stage renal disease, hyperglycemia  EXAM: RIGHT INTERNAL JUGULAR PICC LINE PLACEMENT WITH ULTRASOUND AND FLUOROSCOPIC GUIDANCE  FLUOROSCOPY TIME:  30 SECONDS  PROCEDURE: The patient was advised of the possible risks andcomplications and agreed to undergo the procedure. The patient was then brought to the angiographic suite for the procedure.  The RIGHT NECK was prepped with chlorhexidine, drapedin the usual sterile fashion using maximum barrier technique (cap and mask, sterile gown, sterile gloves, large sterile sheet, hand hygiene and cutaneous antisepsis) and infiltrated locally with 1% Lidocaine.  Ultrasound demonstrated patency of the right internal jugular vein, and this was documented with an image. Under real-time ultrasound guidance, this vein was accessed with a 21 gauge micropuncture needle and image documentation was performed. A 0.018 wire was introduced in to the vein. Over this, a 5 [place] double lumen power PICC was advanced to the lower SVC/right atrial junction. Fluoroscopy during the procedure and fluoro spot radiograph confirms appropriate catheter position. The catheter was flushed and covered with asterile dressing.  Catheter length: 15  Complications: None immediate  IMPRESSION: Successful right internal jugular double-lumen Power PICC line placement with  ultrasound and fluoroscopic guidance. The catheter is ready for use.   Electronically Signed   By: Daryll Brod M.D.   On: 05/17/2014 17:10   Ir US Guide Vasc Access Right  05/17/2014   CLINICAL DATA:  End-stage renal disease, hyperglycemia  EXAM: RIGHT INTERNAL JUGULAR PICC LINE PLACEMENT WITH ULTRASOUND AND FLUOROSCOPIC GUIDANCE  FLUOROSCOPY TIME:  30 SECONDS  PROCEDURE: The patient was advised of the possible risks andcomplications and agreed to undergo the procedure. The patient was then brought to the angiographic suite for the procedure.  The RIGHT NECK was prepped with chlorhexidine, drapedin the usual sterile fashion using maximum barrier technique (cap and mask, sterile gown, sterile gloves, large sterile sheet, hand hygiene and cutaneous antisepsis) and infiltrated locally with 1% Lidocaine.  Ultrasound demonstrated patency of the right internal jugular vein, and this was documented with an image. Under real-time ultrasound guidance, this vein was accessed with a 21 gauge micropuncture needle and image documentation was performed. A 0.018 wire was introduced in to the vein. Over this, a 5 [place] double lumen power PICC was advanced to the lower SVC/right atrial junction. Fluoroscopy during the procedure and fluoro spot radiograph confirms appropriate catheter position. The catheter was flushed and covered with asterile dressing.  Catheter length: 15  Complications: None immediate  IMPRESSION: Successful right internal jugular double-lumen Power PICC line placement with ultrasound and fluoroscopic guidance. The catheter is ready for use.   Electronically Signed   By: Daryll Brod M.D.   On: 05/17/2014 17:10     EKG Interpretation None      MDM   Final diagnoses:  Difficult intravenous access  Diabetic ketoacidosis without coma associated with type 1 diabetes mellitus   CBG with unreadable sugar. Likely DKA. Will plan to order CBC, BMET, CXR, head CT, EKG, POC troponin. Will also consult  Nephrology.  345PM: Spoke with Dr. Justin Mend who recommended either femoral cath or central line. IV team consulted but decided to pursue PICC line placement by IR.   402PM: Able to secure IV access and will give Novolog 10u.   414PM: Lost IV access. Will give her PO fluids as she can tolerate.  450PM: K 7.5. EKG pending. Will give Ca, bicarb, albuterol. She is still off the floor.   509PM: Spoke with Dr. Justin Mend (Nephrology). Plan for dialysis tonight with plan to hold Ca, bicarb. Still off the floor.   58PM: Spoke with Dr. Cruzita Lederer. Plan to admit to stepdown.  Charlott Rakes, MD 05/17/14 [place], MD 05/24/14 [phone]

## 2014-05-17 NOTE — ED Notes (Signed)
Attempted report 

## 2014-05-17 NOTE — Progress Notes (Signed)
Called ED and requested they come set up [place] for pt w/ correct Insulin rate.  We do not have codes to do this. CBG >700. ED said an RN will come set correct rate.

## 2014-05-17 NOTE — H&P (Signed)
History and Physical    [name] YQM:[id number] DOB: 1971-01-19 DOA: 05/17/2014  Referring physician: Dr. Preston Fleeting PCP: [name], MD  Specialists: Nephrology   Chief Complaint: elevated sugars at home  HPI: [name] is a 43 y.o. 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 She reports compliance with her insulin regimen, however in the last couple of days she noticed that her sugars have been running higher and higher, she initially thought she could decrease her CBGs by using insulin at home, however was unable to do so and decided to present to the emergency room. She also reports that following her stroke, she had right-sided arm and leg weakness, which were initially improving, however today she feels like they're weak again as they were in the day that she had the stroke. She also reports upper respiratory symptoms with cough and chest congestion for the past 1-2 days. She endorses nausea and vomiting, denies any diarrhea. She denies any headaches, lightheadedness or dizziness. She denies any dysuria.  In the emergency room, patient was found to be profoundly hyperglycemic, she had an VBG which showed a pH of 7.15. Patient has very poor peripheral access, and she needed to go urgent PICC line placement by interventional radiology and when I evaluated patient, she was not yet started on an insulin drip or any IV fluids. Her labs were pertinent for marked hyperkalemia with a potassium of 8.3, BUN 67, creatinine 9.8, and a pseudohyponatremia to 112, and an anion gap of 30. TRH asked for admission for DKA. Nephrology was  consulted by EDP and patient will undergo hemodialysis tonight. Chest x-ray on admission showed worsening pulmonary edema consistent with congestive heart failure or volume overload, no clear airspace opacity.  Review of Systems: As per history of present illness, otherwise negative   Past Medical History  Diagnosis Date  . Hyperlipidemia   . Alopecia   . Obesity   . Iron deficiency   . ESRD (end stage renal disease)     S/p pancreatic and kidney transplant, but back on HD 2008, dialysis at [facility], Time, Sat  . RLS (restless legs syndrome)   . Injury of conjunctiva and corneal abrasion of right eye without foreign body   . Cellulitis   . Anemia   . Blood transfusion 2004    "when I had my transplant"  . Migraine   . Hyperparathyroidism, secondary   . Diabetic retinopathy   . Hypertension     takes Metoprolol and Exforge daily  . Depression     takes Klonopin nightly  . Diabetes mellitus type 1     Pt states diagnosed at age 75 with prior episodes of DKA. S/p pancreatic transplant   . GERD (gastroesophageal reflux disease)     takes Protonix daily  . Bronchitis   . Migraine   . Asthma   . Emphysema of lung   . Angina     none in past 2 years.  . Stomach pain     Hx: of chronic  . Pneumonia 2012    "double"  . Dialysis patient     tues,thurs,sat  . 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  Surgery was done at [facility].      Past Surgical History  Procedure Laterality Date  . 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 Thyroglobulin      x 7  . Av fistula placement      right upper arm  . Cholecystectomy  1995  .  hd graft placement/removal      "had 2 in my left upper arm"  . Eye surgery    . Retinopathy surgery      . Tooth extraction  10/10/11    X's two  . Insertion of dialysis catheter  12/08/2011    Procedure: INSERTION OF DIALYSIS CATHETER;  Surgeon: Angelia Mould, MD;  Location: [place];  Service: Vascular;  Laterality: Left;  . Av fistula placement  01/22/2012    Procedure: INSERTION OF ARTERIOVENOUS (AV) GORE-TEX GRAFT ARM;  Surgeon: Angelia Mould, MD;  Location: [place];  Service: Vascular;  Laterality: Left;  Ultrasound guided.  . Av fistula placement  03/14/2012    Procedure: INSERTION OF ARTERIOVENOUS (AV) GORE-TEX GRAFT THIGH;  Surgeon: Angelia Mould, MD;  Location: [place];  Service: Vascular;  Laterality: Right;  . False aneurysm repair Right 01/02/2013    Procedure: Excision of lymphocele in right thigh.;  Surgeon: Angelia Mould, MD;  Location: [place];  Service: Vascular;  Laterality: Right;  . Carpal tunnel release Right 03/02/2013    Procedure: CARPAL TUNNEL RELEASE;  Surgeon: Tennis Must, MD;  Location: [place];  Service: Orthopedics;  Laterality: Right;  . Flexible sigmoidoscopy N/A 03/24/2013    Procedure: FLEXIBLE SIGMOIDOSCOPY;  Surgeon: Cleotis Nipper, MD;  Location: [facility] ENDOSCOPY;  Service: Endoscopy;  Laterality: N/A;  . Revision of arteriovenous goretex graft Right 04/02/2013    Procedure: REPAIR OF PSEUDOANEURYSM OF ARTERIOVENOUS GORETEX GRAFT;  Surgeon: Angelia Mould, MD;  Location: [place];  Service: Vascular;  Laterality: Right;  . Carpal tunnel release Left 10/16/2013    Procedure: LEFT CARPAL TUNNEL RELEASE;  Surgeon: Tennis Must, MD;  Location: [place];  Service: Orthopedics;  Laterality: Left;  . Unilateral upper extremeity angiogram N/A 11/12/2011    Procedure: UNILATERAL UPPER Anselmo Rod;  Surgeon: Angelia Mould, MD;  Location: [facility] CATH LAB;  Service: Cardiovascular;  Laterality: N/A;  . Fistulogram Left 03/03/2012    Procedure: FISTULOGRAM;  Surgeon: Angelia Mould, MD;  Location: [facility]  CATH LAB;  Service: Cardiovascular;  Laterality: Left;   Social History:  reports that she has never smoked. She has never used smokeless tobacco. She reports that she does not drink alcohol or use illicit drugs.  Allergies  Allergen Reactions  . Depakote [Divalproex Sodium] Other (See Comments)    Pt gets "delirious"  . Morphine And Related Nausea And Vomiting and Other (See Comments)    "makes me delirious"  . Penicillins Anaphylaxis    Tolerated Zosyn Dec 2014  . Tramadol Nausea And Vomiting  . Imitrex [Sumatriptan] Other (See Comments)    seizures  . Vicodin [Hydrocodone-Acetaminophen] Itching and Rash    Family History  Problem Relation Age of Onset  . Hypertension Mother   . Kidney disease Mother   . Diabetes Mother   . Hypertension Father   . Kidney  disease Father   . Diabetes Father     Prior to Admission medications   Medication Sig Start Date End Date Taking? Authorizing Provider  albuterol (PROVENTIL HFA;VENTOLIN HFA) 108 (90 BASE) MCG/ACT inhaler Inhale 2 puffs into the lungs every 4 (four) hours as needed for wheezing or shortness of breath (as per home regimen). 02/20/13  Yes Kinnie Feil, MD  aspirin 325 MG tablet Take 1 tablet (325 mg total) by mouth daily. 04/20/14  Yes Domenic Polite, MD  calcium acetate (PHOSLO) 667 MG capsule Take 2,001 mg by mouth 3 (three) times daily with meals.   Yes Historical Provider, MD  clonazePAM (KLONOPIN) 0.5 MG tablet Take 1 mg by mouth at bedtime.    Yes Historical Provider, MD  gabapentin (NEURONTIN) 100 MG capsule Take 100 mg by mouth 2 (two) times daily.    Yes Historical Provider, MD  insulin aspart (NOVOLOG) 100 UNIT/ML injection Inject 0-9 Units into the skin 3 (three) times daily with meals. 04/20/14  Yes Domenic Polite, MD  Insulin Glargine (LANTUS) 100 UNIT/ML Solostar Pen Inject 7 Units into the skin 2 (two) times daily with a meal. 01/22/14  Yes Allie Bossier, MD  lidocaine-prilocaine (EMLA) cream Apply 1  application topically 3 (three) times a week. Used for dialysis 10/24/12  Yes Historical Provider, MD  metoCLOPramide (REGLAN) 5 MG tablet Take 1 tablet (5 mg total) by mouth 4 (four) times daily -  before meals and at bedtime. 12/01/13  Yes Janece Canterbury, MD  lidocaine (LIDODERM) 5 % Place 1 patch onto the skin daily. Remove & Discard patch within 12 hours or as directed by MD 05/04/14   Liam Graham, PA-C  metoprolol succinate (TOPROL-XL) 100 MG 24 hr tablet Take 100 mg by mouth daily. Take with or immediately following a meal.    Historical Provider, MD  montelukast (SINGULAIR) 10 MG tablet Take 10 mg by mouth at bedtime.    Historical Provider, MD  multivitamin (RENA-VIT) TABS tablet Take 1 tablet by mouth daily.    Historical Provider, MD  Nutritional Supplements (FEEDING SUPPLEMENT, NEPRO CARB STEADY,) LIQD Take 237 mLs by mouth 2 (two) times daily between meals. 04/23/14   Orson Eva, MD  ondansetron (ZOFRAN-ODT) 4 MG disintegrating tablet Take 1 tablet (4 mg total) by mouth every 6 (six) hours as needed for nausea or vomiting. 12/01/13   Janece Canterbury, MD  oxyCODONE-acetaminophen (PERCOCET/ROXICET) 5-325 MG per tablet Take 1 tablet by mouth every 6 (six) hours as needed for moderate pain or severe pain. 05/04/14   Freeman Caldron Baker, PA-C  pantoprazole (PROTONIX) 40 MG tablet Take 1 tablet (40 mg total) by mouth at bedtime. 03/27/13   Hosie Poisson, MD  simvastatin (ZOCOR) 20 MG tablet Take 1 tablet (20 mg total) by mouth at bedtime. 04/20/14   Domenic Polite, MD  zolpidem (AMBIEN) 5 MG tablet Take 5 mg by mouth at bedtime.    Historical Provider, MD   Physical Exam: Filed Vitals:   05/17/14 1441 05/17/14 1454 05/17/14 1500  BP: 146/64 138/65 135/60  Pulse: 85 80 84  Temp: 97.6 F (36.4 C)    TempSrc: Oral    Resp: 15 16 16   SpO2: 100% 100% 100%     General:  She is in no acute distress   Eyes: PERRL  ENT: moist oropharynx  Neck: supple, no JVD  Cardiovascular: regular rate  without MRG; 2+ peripheral pulses  Respiratory: Mild bibasilar crackles, no wheezing   Abdomen: soft, non tender to  palpation  Skin: no rashes  Musculoskeletal: no peripheral edema  Psychiatric: normal mood and affect  Neurologic: Cranial nerves grossly intact, 4 out of 5 strength in the right upper extremity and right lower extremity, 5 out of 5 strength on the left  Labs on Admission:  Basic Metabolic Panel:  Recent Labs Lab 05/17/14 1509 05/17/14 1618  NA 114* 112*  K 7.5* 8.3*  CL 72* 84*  CO2 12*  --   GLUCOSE 1079* >700*  BUN 51* 67*  CREATININE 9.12* 9.80*  CALCIUM 7.4*  --    CBC:  Recent Labs Lab 05/17/14 1509 05/17/14 1618  WBC 7.3  --   NEUTROABS 5.6  --   HGB 11.8* 15.6*  HCT 39.7 46.0  MCV 111.2*  --   PLT 142*  --    BNP (last 3 results)  Recent Labs  06/27/13 1510 01/14/14 0216  PROBNP 5305.0* 17541.0*   CBG:  Recent Labs Lab 05/17/14 1516  GLUCAP >600*    Radiological Exams on Admission: Ir Fluoro Guide Cv Line Right  05/17/2014   CLINICAL DATA:  End-stage renal disease, hyperglycemia  EXAM: RIGHT INTERNAL JUGULAR PICC LINE PLACEMENT WITH ULTRASOUND AND FLUOROSCOPIC GUIDANCE  FLUOROSCOPY TIME:  30 SECONDS  PROCEDURE: The patient was advised of the possible risks andcomplications and agreed to undergo the procedure. The patient was then brought to the angiographic suite for the procedure.  The RIGHT NECK was prepped with chlorhexidine, drapedin the usual sterile fashion using maximum barrier technique (cap and mask, sterile gown, sterile gloves, large sterile sheet, hand hygiene and cutaneous antisepsis) and infiltrated locally with 1% Lidocaine.  Ultrasound demonstrated patency of the right internal jugular vein, and this was documented with an image. Under real-time ultrasound guidance, this vein was accessed with a 21 gauge micropuncture needle and image documentation was performed. A 0.018 wire was introduced in to the vein. Over this,  a 5 [place] double lumen power PICC was advanced to the lower SVC/right atrial junction. Fluoroscopy during the procedure and fluoro spot radiograph confirms appropriate catheter position. The catheter was flushed and covered with asterile dressing.  Catheter length: 15  Complications: None immediate  IMPRESSION: Successful right internal jugular double-lumen Power PICC line placement with ultrasound and fluoroscopic guidance. The catheter is ready for use.   Electronically Signed   By: Daryll Brod M.D.   On: 05/17/2014 17:10   Ir US Guide Vasc Access Right  05/17/2014   CLINICAL DATA:  End-stage renal disease, hyperglycemia  EXAM: RIGHT INTERNAL JUGULAR PICC LINE PLACEMENT WITH ULTRASOUND AND FLUOROSCOPIC GUIDANCE  FLUOROSCOPY TIME:  30 SECONDS  PROCEDURE: The patient was advised of the possible risks andcomplications and agreed to undergo the procedure. The patient was then brought to the angiographic suite for the procedure.  The RIGHT NECK was prepped with chlorhexidine, drapedin the usual sterile fashion using maximum barrier technique (cap and mask, sterile gown, sterile gloves, large sterile sheet, hand hygiene and cutaneous antisepsis) and infiltrated locally with 1% Lidocaine.  Ultrasound demonstrated patency of the right internal jugular vein, and this was documented with an image. Under real-time ultrasound guidance, this vein was accessed with a 21 gauge micropuncture needle and image documentation was performed. A 0.018 wire was introduced in to the vein. Over this, a 5 [place] double lumen power PICC was advanced to the lower SVC/right atrial junction. Fluoroscopy during the procedure and fluoro spot radiograph confirms appropriate catheter position. The catheter was flushed and covered with asterile dressing.  Catheter length:  15  Complications: None immediate  IMPRESSION: Successful right internal jugular double-lumen Power PICC line placement with ultrasound and fluoroscopic guidance. The  catheter is ready for use.   Electronically Signed   By: Daryll Brod M.D.   On: 05/17/2014 17:10    EKG: Independently reviewed.  Assessment/Plan Principal Problem:   DKA (diabetic ketoacidoses) Active Problems:   Hyperkalemia   Volume overload   End-stage renal disease on hemodialysis   HLD (hyperlipidemia)   CVA (cerebral infarction)   Hyponatremia   DKA  - patient with an anion gap of 30, acidotic on the VBG, she will be admitted to step down unit on an insulin drip, start IV fluids however use judiciously given end-stage renal disease, she will undergo hemodialysis tonight. Appreciate nephrology assistance. - Monitor BMPs every 4 hours overnight - Keep nothing by mouth  ESRD - Dr. Justin Mend has been consulted by the ED physician. Hemodialysis tonight.  History of CVA - 3 weeks ago, with worsening right-sided weakness today, we'll repeat the MRI of the brain.  Hyperkalemia - multifactorial due to end-stage renal disease as well as profound hyperglycemia and in need of insulin, acidosis. - Per hemodialysis  URI type symptoms - mild cough but more productive, likely related to underlying edema on CXR but cannot exclude bronchitis component with subjective fevers at home, start Levofloxacin  Pseudohyponatremia - corrected sodium is at 130   Diet: NPO Fluids: NS DVT Prophylaxis: heparin  Code Status: Full  Family Communication: d/w patient  Disposition Plan: admit to SDU  Time spent: 40  Costin M. Cruzita Lederer, MD [facility] Pager [phone]  If 7PM-7AM, please contact night-coverage [email or web address] Password [facility] 05/17/2014, 6:11 PM

## 2014-05-17 NOTE — ED Notes (Signed)
IV team and 2 RNs unable to get vein access.  MD ordered Vasc Acess.

## 2014-05-17 NOTE — ED Notes (Signed)
Pt states her CBG has read extremely high for the last 2 days.  Pt states she has been compliant with her diabetes medication.  Pt has hx of hemmoragic stroke 3 weeks with right sided weakness/deficient and states she felt simular numbness/weakness at 1330 today as she did when she had a stroke.

## 2014-05-18 ENCOUNTER — Inpatient Hospital Stay (HOSPITAL_COMMUNITY): Payer: Medicare Other

## 2014-05-18 DIAGNOSIS — K3184 Gastroparesis: Secondary | ICD-10-CM

## 2014-05-18 DIAGNOSIS — E1110 Type 2 diabetes mellitus with ketoacidosis without coma: Secondary | ICD-10-CM | POA: Insufficient documentation

## 2014-05-18 DIAGNOSIS — E1310 Other specified diabetes mellitus with ketoacidosis without coma: Secondary | ICD-10-CM

## 2014-05-18 DIAGNOSIS — E8779 Other fluid overload: Secondary | ICD-10-CM | POA: Insufficient documentation

## 2014-05-18 DIAGNOSIS — I6350 Cerebral infarction due to unspecified occlusion or stenosis of unspecified cerebral artery: Secondary | ICD-10-CM

## 2014-05-18 DIAGNOSIS — G2581 Restless legs syndrome: Secondary | ICD-10-CM

## 2014-05-18 DIAGNOSIS — E785 Hyperlipidemia, unspecified: Secondary | ICD-10-CM

## 2014-05-18 LAB — BASIC METABOLIC PANEL
ANION GAP: 9 (ref 5–15)
Anion gap: 5 (ref 5–15)
BUN: 11 mg/dL (ref 6–23)
BUN: 13 mg/dL (ref 6–23)
CALCIUM: 8.1 mg/dL — AB (ref 8.4–10.5)
CALCIUM: 7.7 mg/dL — AB (ref 8.4–10.5)
CO2: 27 mmol/L (ref 19–32)
CO2: 30 mmol/L (ref 19–32)
Chloride: 100 mEq/L (ref 96–112)
Chloride: 101 mEq/L (ref 96–112)
Creatinine, Ser: 3.85 mg/dL — ABNORMAL HIGH (ref 0.50–1.10)
Creatinine, Ser: 4.35 mg/dL — ABNORMAL HIGH (ref 0.50–1.10)
GFR calc Af Amer: 16 mL/min — ABNORMAL LOW (ref >90)
GFR calc Af Amer: 14 mL/min — ABNORMAL LOW (ref >90)
GFR calc non Af Amer: 14 mL/min — ABNORMAL LOW (ref >90)
GFR, EST NON AFRICAN AMERICAN: 12 mL/min — AB (ref >90)
Glucose, Bld: 205 mg/dL — ABNORMAL HIGH (ref 70–99)
Glucose, Bld: 79 mg/dL (ref 70–99)
POTASSIUM: 3.3 mmol/L — AB (ref 3.5–5.1)
POTASSIUM: 2.9 mmol/L — AB (ref 3.5–5.1)
SODIUM: 136 mmol/L (ref 135–145)
SODIUM: 136 mmol/L (ref 135–145)

## 2014-05-18 LAB — GLUCOSE, CAPILLARY
GLUCOSE-CAPILLARY: 279 mg/dL — AB (ref 70–99)
GLUCOSE-CAPILLARY: 96 mg/dL (ref 70–99)
GLUCOSE-CAPILLARY: 70 mg/dL (ref 70–99)
GLUCOSE-CAPILLARY: 100 mg/dL — AB (ref 70–99)
GLUCOSE-CAPILLARY: 149 mg/dL — AB (ref 70–99)
GLUCOSE-CAPILLARY: 284 mg/dL — AB (ref 70–99)
GLUCOSE-CAPILLARY: 248 mg/dL — AB (ref 70–99)
Glucose-Capillary: 191 mg/dL — ABNORMAL HIGH (ref 70–99)
Glucose-Capillary: 216 mg/dL — ABNORMAL HIGH (ref 70–99)
Glucose-Capillary: 168 mg/dL — ABNORMAL HIGH (ref 70–99)
Glucose-Capillary: 85 mg/dL (ref 70–99)
Glucose-Capillary: 186 mg/dL — ABNORMAL HIGH (ref 70–99)
Glucose-Capillary: 194 mg/dL — ABNORMAL HIGH (ref 70–99)
Glucose-Capillary: 516 mg/dL — ABNORMAL HIGH (ref 70–99)
Glucose-Capillary: 129 mg/dL — ABNORMAL HIGH (ref 70–99)
Glucose-Capillary: 396 mg/dL — ABNORMAL HIGH (ref 70–99)

## 2014-05-18 LAB — MRSA PCR SCREENING: MRSA by PCR: NEGATIVE

## 2014-05-18 MED ORDER — DEXTROSE 50 % IV SOLN
INTRAVENOUS | Status: AC
Start: 2014-05-18 — End: 2014-05-18
  Administered 2014-05-18: 12 mL
  Filled 2014-05-18: qty 50

## 2014-05-18 MED ORDER — INSULIN ASPART 100 UNIT/ML ~~LOC~~ SOLN
0.0000 [IU] | SUBCUTANEOUS | Status: DC
  Administered 2014-05-18: 9 [IU] via SUBCUTANEOUS
  Administered 2014-05-18: 1 [IU] via SUBCUTANEOUS
  Administered 2014-05-18: 5 [IU] via SUBCUTANEOUS
  Administered 2014-05-19: 2 [IU] via SUBCUTANEOUS
  Administered 2014-05-19: 5 [IU] via SUBCUTANEOUS
  Administered 2014-05-19: 2 [IU] via SUBCUTANEOUS

## 2014-05-18 MED ORDER — INSULIN GLARGINE 100 UNIT/ML ~~LOC~~ SOLN
7.0000 [IU] | Freq: Two times a day (BID) | SUBCUTANEOUS | Status: DC
Start: 2014-05-19 — End: 2014-05-20
  Administered 2014-05-19 – 2014-05-20 (×3): 7 [IU] via SUBCUTANEOUS
  Filled 2014-05-18 (×5): qty 0.07

## 2014-05-18 MED ORDER — POTASSIUM CHLORIDE CRYS ER 20 MEQ PO TBCR
20.0000 meq | EXTENDED_RELEASE_TABLET | Freq: Once | ORAL | Status: AC
  Administered 2014-05-18: 20 meq via ORAL
  Filled 2014-05-18: qty 1

## 2014-05-18 MED ORDER — DEXTROSE 50 % IV SOLN: 12.0000 mL | Freq: Once | INTRAVENOUS | Status: AC

## 2014-05-18 MED ORDER — INSULIN ASPART 100 UNIT/ML ~~LOC~~ SOLN: 0.0000 [IU] | SUBCUTANEOUS | Status: DC

## 2014-05-18 MED ORDER — INSULIN ASPART 100 UNIT/ML ~~LOC~~ SOLN
6.0000 [IU] | Freq: Three times a day (TID) | SUBCUTANEOUS | Status: DC
  Administered 2014-05-18 – 2014-05-19 (×3): 6 [IU] via SUBCUTANEOUS

## 2014-05-18 MED ORDER — INSULIN GLARGINE 100 UNIT/ML ~~LOC~~ SOLN
15.0000 [IU] | Freq: Once | SUBCUTANEOUS | Status: DC
  Filled 2014-05-18: qty 0.15

## 2014-05-18 MED ORDER — INFLUENZA VAC SPLIT QUAD 0.5 ML IM SUSY
0.5000 mL | PREFILLED_SYRINGE | INTRAMUSCULAR | Status: DC
Start: 2014-05-19 — End: 2014-05-20
  Filled 2014-05-18: qty 0.5

## 2014-05-18 NOTE — Progress Notes (Signed)
Notified Dr. Maudie Mercury on-call for [facility] (attending) regarding pt's Potassium of 3.3. No orders received secondary to pt's renal failure. Will continue to monitor and assess.

## 2014-05-18 NOTE — Progress Notes (Signed)
[facility] ROUNDING NOTE   Subjective:   Interval History: no complaints   Objective:  Vital signs in last 24 hours:  Temp:  [98.1 F (36.7 C)-99.2 F (37.3 C)] 98.4 F (36.9 C) (12/22 1152) Pulse Rate:  [77-94] 82 (12/22 1152) Resp:  [14-32] 14 (12/22 1152) BP: (92-154)/(42-70) 115/54 mmHg (12/22 1152) SpO2:  [96 %-100 %] 100 % (12/22 1152) Weight:  [69.3 kg (152 lb 12.5 oz)] 69.3 kg (152 lb 12.5 oz) (12/22 0050)  Weight change:  Filed Weights   05/18/14 0050  Weight: 69.3 kg (152 lb 12.5 oz)    Intake/Output: I/O last 3 completed shifts: In: 381.3 [P.O.:120; I.V.:261.3] Out: 2202 [Other:2202]   Intake/Output this shift:  Total I/O In: 960 [P.O.:960] Out: -   CVS- RRR RS- CTA ABD- BS present soft non-distended EXT- no edema   Basic Metabolic Panel:  Recent Labs Lab 05/17/14 1509 05/17/14 1618 05/17/14 1806 05/17/14 1950 05/18/14 0105 05/18/14 0354  NA 114* 112* 117* 136* 136 136  K 7.5* 8.3* 5.6* 3.3* 3.3* 2.9*  CL 72* 84* 75* 92* 100 101  CO2 12*  --  15* 22 27 30   GLUCOSE 1079* >700* 1237* 514* 205* 79  BUN 51* 67* 55* 30* 11 13  CREATININE 9.12* 9.80* 9.44* 5.40* 3.85* 4.35*  CALCIUM 7.4*  --  6.9* 7.3* 8.1* 7.7*    Liver Function Tests: No results for input(s): AST, ALT, ALKPHOS, BILITOT, PROT, ALBUMIN in the last 168 hours. No results for input(s): LIPASE, AMYLASE in the last 168 hours. No results for input(s): AMMONIA in the last 168 hours.  CBC:  Recent Labs Lab 05/17/14 1509 05/17/14 1618  WBC 7.3  --   NEUTROABS 5.6  --   HGB 11.8* 15.6*  HCT 39.7 46.0  MCV 111.2*  --   PLT 142*  --     Cardiac Enzymes: No results for input(s): CKTOTAL, CKMB, CKMBINDEX, TROPONINI in the last 168 hours.  BNP: Invalid input(s): POCBNP  CBG:  Recent Labs Lab 05/18/14 0513 05/18/14 0540 05/18/14 0636 05/18/14 0734 05/18/14 1153  GLUCAP 70 100* 129* 149* 284*    Microbiology: Results for orders placed or performed  during the hospital encounter of 05/17/14  MRSA PCR Screening     Status: None   Collection Time: 05/18/14 12:51 AM  Result Value Ref Range Status   MRSA by PCR NEGATIVE NEGATIVE Final    Comment:        The GeneXpert MRSA Assay (FDA approved for NASAL specimens only), is one component of a comprehensive MRSA colonization surveillance program. It is not intended to diagnose MRSA infection nor to guide or monitor treatment for MRSA infections.     Coagulation Studies: No results for input(s): LABPROT, INR in the last 72 hours.  Urinalysis: No results for input(s): COLORURINE, LABSPEC, PHURINE, GLUCOSEU, HGBUR, BILIRUBINUR, KETONESUR, PROTEINUR, UROBILINOGEN, NITRITE, LEUKOCYTESUR in the last 72 hours.  Invalid input(s): APPERANCEUR    Imaging: Dg Chest 2 View  05/17/2014   CLINICAL DATA:  Hyperglycemia for 3 days. History of diabetes. Initial encounter.  EXAM: CHEST  2 VIEW  COMPARISON:  04/13/2014 and 04/14/2014.  FINDINGS: 1747 hr. Right IJ central venous catheter projects to the SVC right atrial level. The heart is enlarged. Pulmonary edema has mildly worsened. There is no confluent airspace opacity, significant pleural effusion or pneumothorax. The bones appear stable. Telemetry leads overlie the upper abdomen.  IMPRESSION: Worsened pulmonary edema consistent with congestive heart failure or volume overload. Stable cardiomegaly.  Electronically Signed   By: Camie Patience M.D.   On: 05/17/2014 18:14   Ct Head Wo Contrast  05/18/2014   CLINICAL DATA:  Initial evaluation for right-sided weakness and numbness. History of prior stroke.  EXAM: CT HEAD WITHOUT CONTRAST  TECHNIQUE: Contiguous axial images were obtained from the base of the skull through the vertex without intravenous contrast.  COMPARISON:  Prior MRI from 04/18/2014.  FINDINGS: Cerebral volume within normal limits for patient age. Patchy hypodensity within the periventricular and deep white matter both cerebral  hemispheres consistent with chronic small vessel ischemic disease.  No acute cortically based or large vessel territory infarct. Remote lacunar infarct present within the left basal ganglia. Basal ganglia calcifications noted. No intracranial hemorrhage.  No mass lesion or midline shift. No hydrocephalus. No extra-axial fluid collection.  Calvarium intact.  Scalp soft tissues within normal limits. No acute abnormality seen about the orbits. Calcification at the superior left orbit is stable.  Paranasal sinuses and mastoid air cells are clear.  IMPRESSION: 1. No acute intracranial hemorrhage or large vessel territory infarct. 2. Chronic small vessel ischemic disease with remote lacunar infarct within the left basal ganglia.   Electronically Signed   By: Jeannine Boga M.D.   On: 05/18/2014 00:38   Mr [name] Contrast  05/18/2014   CLINICAL DATA:  43 year old diabetic female with end-stage renal disease. Recent stroke with residual right-sided weakness. Presenting to emergency room with persistently elevated blood sugars. Subsequent encounter.  EXAM: MRI HEAD WITHOUT CONTRAST  TECHNIQUE: Multiplanar, multiecho pulse sequences of the brain and surrounding structures were obtained without intravenous contrast.  COMPARISON:  05/17/2014 head CT.  04/18/2014 brain MR.  FINDINGS: No acute infarct.  Recent left dorsal pons infarct not well delineated on the present exam.  Remote small left globus pallidus infarct.  Mild small vessel disease type changes.  No intracranial hemorrhage.  Mild atrophy. Mild ventricular prominence probably related to atrophy rather than hydrocephalus and unchanged.  Major intracranial vascular structures are patent.  Exophthalmos.  Minimal paranasal sinus mucosal thickening.  Decreased signal intensity of bone marrow may be related to changes of renal disease.  Cervical medullary junction, pineal region and pituitary region unremarkable.  IMPRESSION: No acute abnormality.  Please see  above.   Electronically Signed   By: Chauncey Cruel M.D.   On: 05/18/2014 11:01   Ir Fluoro Guide Cv Line Right  05/17/2014   CLINICAL DATA:  End-stage renal disease, hyperglycemia  EXAM: RIGHT INTERNAL JUGULAR PICC LINE PLACEMENT WITH ULTRASOUND AND FLUOROSCOPIC GUIDANCE  FLUOROSCOPY TIME:  30 SECONDS  PROCEDURE: The patient was advised of the possible risks andcomplications and agreed to undergo the procedure. The patient was then brought to the angiographic suite for the procedure.  The RIGHT NECK was prepped with chlorhexidine, drapedin the usual sterile fashion using maximum barrier technique (cap and mask, sterile gown, sterile gloves, large sterile sheet, hand hygiene and cutaneous antisepsis) and infiltrated locally with 1% Lidocaine.  Ultrasound demonstrated patency of the right internal jugular vein, and this was documented with an image. Under real-time ultrasound guidance, this vein was accessed with a 21 gauge micropuncture needle and image documentation was performed. A 0.018 wire was introduced in to the vein. Over this, a 5 [place] double lumen power PICC was advanced to the lower SVC/right atrial junction. Fluoroscopy during the procedure and fluoro spot radiograph confirms appropriate catheter position. The catheter was flushed and covered with asterile dressing.  Catheter length: 15  Complications: None immediate  IMPRESSION: Successful right internal jugular double-lumen Power PICC line placement with ultrasound and fluoroscopic guidance. The catheter is ready for use.   Electronically Signed   By: Daryll Brod M.D.   On: 05/17/2014 17:10   Ir US Guide Vasc Access Right  05/17/2014   CLINICAL DATA:  End-stage renal disease, hyperglycemia  EXAM: RIGHT INTERNAL JUGULAR PICC LINE PLACEMENT WITH ULTRASOUND AND FLUOROSCOPIC GUIDANCE  FLUOROSCOPY TIME:  30 SECONDS  PROCEDURE: The patient was advised of the possible risks andcomplications and agreed to undergo the procedure. The patient was then  brought to the angiographic suite for the procedure.  The RIGHT NECK was prepped with chlorhexidine, drapedin the usual sterile fashion using maximum barrier technique (cap and mask, sterile gown, sterile gloves, large sterile sheet, hand hygiene and cutaneous antisepsis) and infiltrated locally with 1% Lidocaine.  Ultrasound demonstrated patency of the right internal jugular vein, and this was documented with an image. Under real-time ultrasound guidance, this vein was accessed with a 21 gauge micropuncture needle and image documentation was performed. A 0.018 wire was introduced in to the vein. Over this, a 5 [place] double lumen power PICC was advanced to the lower SVC/right atrial junction. Fluoroscopy during the procedure and fluoro spot radiograph confirms appropriate catheter position. The catheter was flushed and covered with asterile dressing.  Catheter length: 15  Complications: None immediate  IMPRESSION: Successful right internal jugular double-lumen Power PICC line placement with ultrasound and fluoroscopic guidance. The catheter is ready for use.   Electronically Signed   By: Daryll Brod M.D.   On: 05/17/2014 17:10     Medications:     . albuterol  10 mg Nebulization Once  . aspirin  325 mg Oral Daily  . calcium gluconate  1 g Intravenous Once  . clonazePAM  1 mg Oral QHS  . gabapentin  100 mg Oral BID  . heparin  5,000 Units Subcutaneous 3 times per day  . [START ON 05/19/2014] Influenza vac split quadrivalent PF  0.5 mL Intramuscular Tomorrow-1000  . insulin aspart  0-9 Units Subcutaneous 6 times per day  . insulin aspart  6 Units Subcutaneous TID WC  . [START ON 05/19/2014] insulin glargine  7 Units Subcutaneous BID  . levofloxacin  500 mg Oral Q48H  . lidocaine  1 patch Transdermal Q24H  . metoCLOPramide  5 mg Oral TID AC & HS  . metoprolol succinate  100 mg Oral Daily  . montelukast  10 mg Oral QHS  . multivitamin  1 tablet Oral Daily  . pantoprazole  40 mg Oral QHS  .  simvastatin  20 mg Oral QHS  . sodium bicarbonate  50 mEq Intravenous Once  . sodium chloride  500 mL Intravenous Once  . 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

## 2014-05-18 NOTE — Progress Notes (Signed)
UR Completed.  [phone]  

## 2014-05-18 NOTE — Progress Notes (Signed)
[name] / ICU Progress Note  [name] IRS:[id number] DOB: 01-07-1971 DOA: 05/17/2014 PCP: [name] Gene, MD   Brief narrative: 43 year old BF PMHx poorly-controlled brittle type 1 diabetes as well as chronic kidney disease on dialysis, noncompliance. 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 She has had repeated admissions for DKA. 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 She reports over several days prior to this presentation her a.m. CBGs progressively increasing. Utilized her normal insulins and sliding scale insulin at home without improvement in symptoms so she opted to seek treatment in the emergency department. She also had issues with nausea and vomiting.  In the ER she was found to be profoundly hyperglycemic with a metabolic acidosis. Because of extremely poor peripheral venous access an urgent PICC line was placed by interventional radiology while she was in the emergency department. She also had marked hyperkalemia with potassium of 8.3, BUN 67 gr 9.8 and pseudohyponatremia sodium 112 and her anion gap was 30. Nephrology was consulted by the emergency room physician and plans were to proceed with emergent hemodialysis on the night of admission. Chest x-ray confirmed worsening pulmonary edema consistent with congestive heart failure volume overload without evidence of an opacity.  In addition the patient reported to the admitting physician that her previous neurological symptoms of extremity weakness had been improving but on the date of admission she felt she was once again developing right-sided weakness so an MRI was ordered by the admitting physician.  HPI/Subjective: Reports feeling much better. States right sided numbness and weakness markedly improved and states is much better than during previous hospitalization. No chest pain or shortness  of breath reported. No headache.   Assessment/Plan:    DKA (diabetic ketoacidoses) -Anion gap is closed with combination of treating hyperglycemia and dialysis -Was given a dose of Lantus insulin 15 units earlier this a.m.; typically on Lantus 7 units twice a day -This patient well known; history of very brittle diabetes and can quickly become very hyperglycemic or hypoglycemic so will monitor for at least an additional 24 hours and stepdown unit while insulins are being adjusted -Continue sliding scale insulin -Usually on NovoLog 6 units TID with meals so will resume    Volume overload -Does not appear she missed dialysis outpatient treatment -Is resolved with dialysis    End-stage renal disease on hemodialysis/hyperkalemia -Emergent hemodialysis at presentation on 12/21 and anticipate to resume her usual schedule beginning on 12/22    Upper respiratory infection symptoms/question bronchitis -Chest x-ray more consistent with edema -Patient reported productive cough and subjective fevers at home prior to admission -cont Levaquin    Recent Left pontine CVA -Reporting recurrence of previously improved right upper extremity weakness and numbness at presentation consistent with prior stroke symptoms -MRI obtained this admission demonstrated recent left dorsal pons infarct not well delineated on the present exam also found remote small left globus pallidus infarct as well -Patient reports symptoms improving -OT evaluation    Hyperlipidemia -Continue Zocor    GERD (gastroesophageal reflux disease)    RLS (restless legs syndrome)    Gastroparesis -Reglan 5 mg TID   DVT prophylaxis: Subcutaneous heparin Code Status: Full Family Communication: No family at bedside Disposition Plan/Expected LOS: Remain in stepdown monitoring for abrupt changes in glycemic control given history of brittle diabetes   Consultants: Nephrology/Dr. Jonnie Finner  Procedures: 12/21 CT head without  contrast;-No acute  cortically based or large vessel territory infarct. - Remote lacunar infarct present within the left basal ganglia. 12/22 MRI brain without contrast;No acute infarct. -Recent left dorsal pons infarct not well delineated on the present exam. -Remote small left globus pallidus infarct.  Cultures: 12/22 MRSA by PCR negative   Antibiotics: Levaquin 12/21 >  Objective: Blood pressure 115/54, pulse 82, temperature 98.4 F (36.9 C), temperature source Oral, resp. rate 14, height 5\' 1"  (1.549 m), weight 152 lb 12.5 oz (69.3 kg), last menstrual period 05/12/2014, SpO2 100 %.  Intake/Output Summary (Last 24 hours) at 05/18/14 1327 Last data filed at 05/18/14 1696  Gross per 24 hour  Intake 861.31 ml  Output   2202 ml  Net -1340.69 ml     Exam: Gen: No acute respiratory distress Chest: Coarse to auscultation bilaterally without wheezes, rhonchi or crackles, 1 L Cardiac: Regular rate and rhythm, S1-S2, no rubs murmurs or gallops, no peripheral edema, no JVD Abdomen: Soft nontender nondistended without obvious hepatosplenomegaly, no ascites Extremities: Symmetrical in appearance without cyanosis, clubbing or effusion  Scheduled Meds:  Scheduled Meds: . albuterol  10 mg Nebulization Once  . aspirin  325 mg Oral Daily  . calcium gluconate  1 g Intravenous Once  . clonazePAM  1 mg Oral QHS  . gabapentin  100 mg Oral BID  . heparin  5,000 Units Subcutaneous 3 times per day  . [START ON 05/19/2014] Influenza vac split quadrivalent PF  0.5 mL Intramuscular Tomorrow-1000  . insulin aspart  0-9 Units Subcutaneous 6 times per day  . [START ON 05/19/2014] insulin glargine  7 Units Subcutaneous BID  . levofloxacin  500 mg Oral Q48H  . lidocaine  1 patch Transdermal Q24H  . metoCLOPramide  5 mg Oral TID AC & HS  . metoprolol succinate  100 mg Oral Daily  . montelukast  10 mg Oral QHS  . multivitamin  1 tablet Oral Daily  . pantoprazole  40 mg Oral QHS  . simvastatin  20  mg Oral QHS  . sodium bicarbonate  50 mEq Intravenous Once  . sodium chloride  500 mL Intravenous Once  . zolpidem  5 mg Oral QHS   Continuous Infusions:   Data Reviewed: Basic Metabolic Panel:  Recent Labs Lab 05/17/14 1509 05/17/14 1618 05/17/14 1806 05/17/14 1950 05/18/14 0105 05/18/14 0354  NA 114* 112* 117* 136* 136 136  K 7.5* 8.3* 5.6* 3.3* 3.3* 2.9*  CL 72* 84* 75* 92* 100 101  CO2 12*  --  15* 22 27 30   GLUCOSE 1079* >700* 1237* 514* 205* 79  BUN 51* 67* 55* 30* 11 13  CREATININE 9.12* 9.80* 9.44* 5.40* 3.85* 4.35*  CALCIUM 7.4*  --  6.9* 7.3* 8.1* 7.7*   Liver Function Tests: No results for input(s): AST, ALT, ALKPHOS, BILITOT, PROT, ALBUMIN in the last 168 hours. No results for input(s): LIPASE, AMYLASE in the last 168 hours. No results for input(s): AMMONIA in the last 168 hours. CBC:  Recent Labs Lab 05/17/14 1509 05/17/14 1618  WBC 7.3  --   NEUTROABS 5.6  --   HGB 11.8* 15.6*  HCT 39.7 46.0  MCV 111.2*  --   PLT 142*  --    Cardiac Enzymes: No results for input(s): CKTOTAL, CKMB, CKMBINDEX, TROPONINI in the last 168 hours. BNP (last 3 results)  Recent Labs  06/27/13 1510 01/14/14 0216  PROBNP 5305.0* 17541.0*   CBG:  Recent Labs Lab 05/18/14 0513 05/18/14 0540 05/18/14 0636 05/18/14 0734 05/18/14 1153  GLUCAP 70 100* 129* 149* 284*    Recent Results (from the past 240 hour(s))  MRSA PCR Screening     Status: None   Collection Time: 05/18/14 12:51 AM  Result Value Ref Range Status   MRSA by PCR NEGATIVE NEGATIVE Final    Comment:        The GeneXpert MRSA Assay (FDA approved for NASAL specimens only), is one component of a comprehensive MRSA colonization surveillance program. It is not intended to diagnose MRSA infection nor to guide or monitor treatment for MRSA infections.      Studies:  Recent x-ray studies have been reviewed in detail by the Attending Physician  Time spent :      Erin Hearing, [facility] Office  [phone] Pager [phone]   **If unable to reach the above provider after paging please contact the [place] @ [phone]  On-Call/Text Page:      [email or web address]      password [email or web address]  If 7PM-7AM, please contact night-coverage [email or web address] Password [email or web address] 05/18/2014, 1:27 PM   LOS: 1 day   Examined Patient and discussed A&P with ANP Ebony Hail and agree with above plan.  I have reviewed the entire database. I have made any necessary editorial changes, and agree with its content. Pt with Multiple Complex medical problems> 40 min spent in direct Pt care    Dia Crawford, MD [facility] [phone] pager

## 2014-05-19 LAB — CBC
HCT: 29.3 % — ABNORMAL LOW (ref 36.0–46.0)
Hemoglobin: 9.6 g/dL — ABNORMAL LOW (ref 12.0–15.0)
MCH: 33.3 pg (ref 26.0–34.0)
MCHC: 32.8 g/dL (ref 30.0–36.0)
MCV: 101.7 fL — ABNORMAL HIGH (ref 78.0–100.0)
Platelets: 171 10*3/uL (ref 150–400)
RBC: 2.88 MIL/uL — AB (ref 3.87–5.11)
RDW: 16.2 % — AB (ref 11.5–15.5)
WBC: 6.2 10*3/uL (ref 4.0–10.5)

## 2014-05-19 LAB — BASIC METABOLIC PANEL
Anion gap: 16 — ABNORMAL HIGH (ref 5–15)
BUN: 32 mg/dL — ABNORMAL HIGH (ref 6–23)
CO2: 20 mmol/L (ref 19–32)
CREATININE: 7.37 mg/dL — AB (ref 0.50–1.10)
Calcium: 7.3 mg/dL — ABNORMAL LOW (ref 8.4–10.5)
Chloride: 96 mEq/L (ref 96–112)
GFR, EST AFRICAN AMERICAN: 7 mL/min — AB (ref >90)
GFR, EST NON AFRICAN AMERICAN: 6 mL/min — AB (ref >90)
Glucose, Bld: 342 mg/dL — ABNORMAL HIGH (ref 70–99)
POTASSIUM: 3.9 mmol/L (ref 3.5–5.1)
Sodium: 132 mmol/L — ABNORMAL LOW (ref 135–145)

## 2014-05-19 LAB — GLUCOSE, CAPILLARY
GLUCOSE-CAPILLARY: 11 mg/dL — AB (ref 70–99)
Glucose-Capillary: 154 mg/dL — ABNORMAL HIGH (ref 70–99)
Glucose-Capillary: 186 mg/dL — ABNORMAL HIGH (ref 70–99)
Glucose-Capillary: 198 mg/dL — ABNORMAL HIGH (ref 70–99)
Glucose-Capillary: 251 mg/dL — ABNORMAL HIGH (ref 70–99)
Glucose-Capillary: 390 mg/dL — ABNORMAL HIGH (ref 70–99)
Glucose-Capillary: 418 mg/dL — ABNORMAL HIGH (ref 70–99)

## 2014-05-19 MED ORDER — INSULIN ASPART 100 UNIT/ML ~~LOC~~ SOLN
0.0000 [IU] | Freq: Three times a day (TID) | SUBCUTANEOUS | Status: DC
  Administered 2014-05-19 – 2014-05-20 (×2): 9 [IU] via SUBCUTANEOUS

## 2014-05-19 MED ORDER — ALBUTEROL SULFATE (2.5 MG/3ML) 0.083% IN NEBU: 3.0000 mL | INHALATION_SOLUTION | RESPIRATORY_TRACT | Status: DC | PRN

## 2014-05-19 MED ORDER — PENTAFLUOROPROP-TETRAFLUOROETH EX AERO
INHALATION_SPRAY | CUTANEOUS | Status: AC
  Filled 2014-05-19: qty 103.5

## 2014-05-19 MED ORDER — DEXTROSE 50 % IV SOLN
INTRAVENOUS | Status: AC
  Administered 2014-05-19: 50 mL
  Filled 2014-05-19: qty 50

## 2014-05-19 MED ORDER — DIPHENHYDRAMINE HCL 50 MG/ML IJ SOLN
25.0000 mg | Freq: Once | INTRAMUSCULAR | Status: AC
  Administered 2014-05-19: 25 mg via INTRAVENOUS

## 2014-05-19 MED ORDER — DIPHENHYDRAMINE HCL 50 MG/ML IJ SOLN
INTRAMUSCULAR | Status: AC
  Filled 2014-05-19: qty 1

## 2014-05-19 NOTE — Progress Notes (Signed)
[facility]  [name] RJJ:[id number] DOB: 05-01-1971 DOA: 05/17/2014 PCP: [name], MD  Brief narrative: 43 year old F Hx poorly-controlled brittle type 1 diabetes, chronic kidney disease on dialysis, and medical noncompliance. She has had repeated admissions for DKA. 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 She reported to the ER with climbing CBGs she was unable to control at home.    In the ER she was found to be profoundly hyperglycemic with a metabolic acidosis. Because of extremely poor peripheral venous access an urgent PICC line was placed by interventional radiology while she was in the emergency department. She also had marked hyperkalemia with potassium of 8.3, BUN 67 crt 9.8 and anion gap was 30. Nephrology was consulted by the emergency room physician and plans were to proceed with emergent hemodialysis on the night of admission. Chest x-ray confirmed worsening pulmonary edema consistent with congestive heart failure without evidence of an opacity.  In addition the patient reported to the admitting physician that her previous neurological symptoms of extremity weakness had been improving but on the date of admission she felt she was once again developing right-sided weakness so an MRI was ordered by the admitting physician.  HPI/Subjective: No new complaints at present.  Denies any new neurologic complaints.  States her R arm numbness and weakness is "the same as yesterday."  Denies sob, cp, n/v, or abdom pain.    Assessment/Plan:    DKA (diabetic ketoacidoses) -now off insulin gtt -CBG quite erratic, w/ an episode of profound hypoglycemia this morning  -cont to follow in SDU - gently adjust insulin regimen and follow     Volume overload -resolved with dialysis    ESRD on hemodialysis / hyperkalemia -Emergent hemodialysis at  presentation on 12/21 - Nephrology following for ongoing HD treatements     Upper respiratory infection symptoms / question bronchitis -Chest x-ray more consistent with edema -Patient reported productive cough and subjective fevers at home prior to admission -no fever as intpt and no elevated WBC therefore will not continue levaquin and will follow clinically     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 -Reported recurrence of previously improved right upper extremity weakness and numbness at presentation consistent with prior stroke symptoms -MRI this admission noted recent left dorsal pons infarct not well delineated on the present exam but noted findings suggestive of remote small left globus pallidus infarct  -Patient reports symptoms stable today  -PT/OT evaluations    Hyperlipidemia -Continue Zocor    GERD -cont protonix    Restless legs syndrome    Gastroparesis -Reglan 5 mg TID  DVT prophylaxis: Subcutaneous heparin Code Status: Full Family Communication: No family at bedside Disposition Plan/Expected LOS: Remain in stepdown monitoring for abrupt changes in glycemic control given history of brittle diabetes  Consultants: Nephrology  Procedures: 12/21 CT head without contrast;-No acute cortically based or large vessel territory infarct.- Remote lacunar infarct present within the left basal ganglia. 12/22 MRI brain without contrast;No acute infarct.-Recent left dorsal pons infarct not well delineated on the present exam.-Remote small left globus pallidus infarct.  Antibiotics: Levaquin 12/21   Objective: Blood pressure 131/67, pulse 85, temperature 98.2 F (36.8 C), temperature source Tympanic, resp. rate 15, height 5\' 1"  (1.549 m), weight 69.1 kg (152 lb 5.4 oz), last menstrual period 05/12/2014, SpO2 98 %.  Intake/Output Summary (Last 24 hours) at  05/19/14 1754 Last data filed at 05/19/14 1712  Gross per 24 hour  Intake      0 ml  Output   2009 ml  Net   -2009 ml   Exam: Gen: No acute respiratory distress Chest: Coarse to auscultation bilaterally without wheeze or focal crackles Cardiac: Regular rate and rhythm, no M/G/R  Abdomen: Soft nontender nondistended without obvious hepatosplenomegaly, no ascites Extremities: no signif C/C/E B LE Neuro: 5/5 strength B U&LE - CN II - XII intact   Scheduled Meds:  Scheduled Meds: . albuterol  10 mg Nebulization Once  . aspirin  325 mg Oral Daily  . calcium gluconate  1 g Intravenous Once  . clonazePAM  1 mg Oral QHS  . diphenhydrAMINE      . gabapentin  100 mg Oral BID  . heparin  5,000 Units Subcutaneous 3 times per day  . Influenza vac split quadrivalent PF  0.5 mL Intramuscular Tomorrow-1000  . insulin aspart  0-9 Units Subcutaneous 6 times per day  . insulin aspart  6 Units Subcutaneous TID WC  . insulin glargine  7 Units Subcutaneous BID  . levofloxacin  500 mg Oral Q48H  . lidocaine  1 patch Transdermal Q24H  . metoCLOPramide  5 mg Oral TID AC & HS  . metoprolol succinate  100 mg Oral Daily  . montelukast  10 mg Oral QHS  . multivitamin  1 tablet Oral Daily  . pantoprazole  40 mg Oral QHS  . pentafluoroprop-tetrafluoroeth      . simvastatin  20 mg Oral QHS  . sodium bicarbonate  50 mEq Intravenous Once  . sodium chloride  500 mL Intravenous Once  . zolpidem  5 mg Oral QHS    Data Reviewed: Basic Metabolic Panel:  Recent Labs Lab 05/17/14 1806 05/17/14 1950 05/18/14 0105 05/18/14 0354 05/19/14 0640  NA 117* 136* 136 136 132*  K 5.6* 3.3* 3.3* 2.9* 3.9  CL 75* 92* 100 101 96  CO2 15* 22 27 30 20   GLUCOSE 1237* 514* 205* 79 342*  BUN 55* 30* 11 13 32*  CREATININE 9.44* 5.40* 3.85* 4.35* 7.37*  CALCIUM 6.9* 7.3* 8.1* 7.7* 7.3*   Liver Function Tests: No results for input(s): AST, ALT, ALKPHOS, BILITOT, PROT, ALBUMIN in the last 168 hours.  CBC:  Recent Labs Lab 05/17/14 1509 05/17/14 1618 05/19/14 0640  WBC 7.3  --  6.2  NEUTROABS 5.6  --   --   HGB 11.8*  15.6* 9.6*  HCT 39.7 46.0 29.3*  MCV 111.2*  --  101.7*  PLT 142*  --  171   CBG:  Recent Labs Lab 05/19/14 0434 05/19/14 0745 05/19/14 1233 05/19/14 1244 05/19/14 1739  GLUCAP 186* 251* 11* 154* 390*    Recent Results (from the past 240 hour(s))  MRSA PCR Screening     Status: None   Collection Time: 05/18/14 12:51 AM  Result Value Ref Range Status   MRSA by PCR NEGATIVE NEGATIVE Final    Comment:        The GeneXpert MRSA Assay (FDA approved for NASAL specimens only), is one component of a comprehensive MRSA colonization surveillance program. It is not intended to diagnose MRSA infection nor to guide or monitor treatment for MRSA infections.      Studies:  Recent x-ray studies have been reviewed in detail by the Attending Physician  Time spent : 76 mins  Cherene Altes, MD [facility] For Consults/Admissions - Flow Manager - [phone] Office  [phone] Pager [phone]  On-Call/Text Page:      [email or web address]      password [facility]  05/19/2014, 5:54 PM   LOS: 2 days

## 2014-05-19 NOTE — Progress Notes (Signed)
Hypoglycemic of 11. 1 amp glucose. repeat bl. Sugar 169. Pt alert and oriented and transported to hemo by staff. Observed eating graham crackers and a drink

## 2014-05-19 NOTE — Procedures (Signed)
I have seen and examined this patient and agree with the plan of care. This patient was seen on dialysis with no issues Hussain Maimone W 05/19/2014, 2:05 PM

## 2014-05-19 NOTE — Evaluation (Signed)
Occupational Therapy Evaluation Patient Details Name: RUMI KOLODZIEJ MRN: [id number] DOB: 1971/03/27 Today's Date: 05/19/2014    History of Present Illness Pt admitted with DKA. Pt with hx of recent CVA, uncontrolled DM, ESRD.   Clinical Impression   Pt was independent in ADL and IADL PTA.  Performing self care and ADL transfers at an independent to modified independent level.  Pt observed while eating breakfast and note to have difficulty swallowing and coughing episodes.  Pt reports this occurs at home as well.  RN notified. No further OT needs.    Follow Up Recommendations  No OT follow up;Supervision - Intermittent    Equipment Recommendations  None recommended by OT    Recommendations for Other Services       Precautions / Restrictions Restrictions Weight Bearing Restrictions: No      Mobility Bed Mobility Overal bed mobility: Independent                Transfers Overall transfer level: Independent                    Balance                                            ADL Overall ADL's : Modified independent                                             Vision                     Perception     Praxis      Pertinent Vitals/Pain Pain Assessment: No/denies pain     Hand Dominance Right   Extremity/Trunk Assessment Upper Extremity Assessment Upper Extremity Assessment: RUE deficits/detail RUE Deficits / Details: full AROM and functional use, does not trust to use for carrying heavy objects or hot items   Lower Extremity Assessment Lower Extremity Assessment: Overall WFL for tasks assessed   Cervical / Trunk Assessment Cervical / Trunk Assessment: Normal   Communication Communication Communication: No difficulties   Cognition Arousal/Alertness: Awake/alert Behavior During Therapy: WFL for tasks assessed/performed Overall Cognitive Status: Within Functional Limits for tasks assessed                      General Comments       Exercises       Shoulder Instructions      Home Living Family/patient expects to be discharged to:: Private residence Living Arrangements: Alone Available Help at Discharge: Friend(s);Available PRN/intermittently Type of Home: Apartment Home Access: Level entry     Home Layout: One level     Bathroom Shower/Tub: Teacher, early years/pre: Standard     Home Equipment: None   Additional Comments: boyfriend helps as needed      Prior Functioning/Environment Level of Independence: Independent        Comments: pt does not drive    OT Diagnosis:     OT Problem List:     OT Treatment/Interventions:      OT Goals(Current goals can be found in the care plan section) Acute Rehab OT Goals Patient Stated Goal: return home   OT Frequency:     Barriers to D/C:  Co-evaluation              End of Session Equipment Utilized During Treatment: Gait belt Nurse Communication: Other (comment) (swallowing difficulties)  Activity Tolerance: Patient tolerated treatment well Patient left: in bed   Time: 0910-    Charges:  OT General Charges $OT Visit: 1 Procedure OT Evaluation $Initial OT Evaluation Tier I: 1 Procedure OT Treatments $Self Care/Home Management : 8-22 mins G-Codes:    [name] 05/19/2014, 9:48 AM [phone]

## 2014-05-20 DIAGNOSIS — N189 Chronic kidney disease, unspecified: Secondary | ICD-10-CM

## 2014-05-20 DIAGNOSIS — E1022 Type 1 diabetes mellitus with diabetic chronic kidney disease: Secondary | ICD-10-CM

## 2014-05-20 LAB — GLUCOSE, CAPILLARY
GLUCOSE-CAPILLARY: 140 mg/dL — AB (ref 70–99)
GLUCOSE-CAPILLARY: 115 mg/dL — AB (ref 70–99)
GLUCOSE-CAPILLARY: 401 mg/dL — AB (ref 70–99)
Glucose-Capillary: 143 mg/dL — ABNORMAL HIGH (ref 70–99)

## 2014-05-20 LAB — HEPATITIS B SURFACE ANTIGEN: Hepatitis B Surface Ag: NEGATIVE

## 2014-05-20 MED ORDER — INSULIN GLARGINE 100 UNIT/ML ~~LOC~~ SOLN: 5.0000 [IU] | Freq: Two times a day (BID) | SUBCUTANEOUS | Status: DC

## 2014-05-20 MED ORDER — CALCIUM ACETATE 667 MG PO CAPS: 2001.0000 mg | ORAL_CAPSULE | Freq: Three times a day (TID) | ORAL | Status: DC

## 2014-05-20 MED ORDER — ASPIRIN 325 MG PO TABS: 325.0000 mg | ORAL_TABLET | Freq: Every day | ORAL | Status: AC

## 2014-05-20 MED ORDER — METOPROLOL SUCCINATE ER 100 MG PO TB24: 100.0000 mg | ORAL_TABLET | Freq: Every day | ORAL | Status: DC

## 2014-05-20 MED ORDER — SIMVASTATIN 20 MG PO TABS: 20.0000 mg | ORAL_TABLET | Freq: Every day | ORAL | Status: AC

## 2014-05-20 NOTE — Progress Notes (Signed)
[facility] ROUNDING NOTE   Subjective:   Interval History: sitting out of bed .   eating breakfast  Objective:  Vital signs in last 24 hours:  Temp:  [98 F (36.7 C)-98.7 F (37.1 C)] 98.4 F (36.9 C) (12/24 0805) Pulse Rate:  [74-87] 75 (12/24 0805) Resp:  [14-19] 18 (12/24 0805) BP: (88-159)/(42-91) 123/66 mmHg (12/24 0805) SpO2:  [97 %-100 %] 100 % (12/24 0805) Weight:  [69.1 kg (152 lb 5.4 oz)-71 kg (156 lb 8.4 oz)] 69.1 kg (152 lb 5.4 oz) (12/23 1712)  Weight change:  Filed Weights   05/18/14 0050 05/19/14 1259 05/19/14 1712  Weight: 69.3 kg (152 lb 12.5 oz) 71 kg (156 lb 8.4 oz) 69.1 kg (152 lb 5.4 oz)    Intake/Output: I/O last 3 completed shifts: In: 500 [P.O.:500] Out: 2009 [Other:2009]   Intake/Output this shift:  Total I/O In: 480 [P.O.:480] Out: -   CVS- RRR RS- CTA ABD- BS present soft non-distended EXT- no edema   Basic Metabolic Panel:  Recent Labs Lab 05/17/14 1806 05/17/14 1950 05/18/14 0105 05/18/14 0354 05/19/14 0640  NA 117* 136* 136 136 132*  K 5.6* 3.3* 3.3* 2.9* 3.9  CL 75* 92* 100 101 96  CO2 15* 22 27 30 20   GLUCOSE 1237* 514* 205* 79 342*  BUN 55* 30* 11 13 32*  CREATININE 9.44* 5.40* 3.85* 4.35* 7.37*  CALCIUM 6.9* 7.3* 8.1* 7.7* 7.3*    Liver Function Tests: No results for input(s): AST, ALT, ALKPHOS, BILITOT, PROT, ALBUMIN in the last 168 hours. No results for input(s): LIPASE, AMYLASE in the last 168 hours. No results for input(s): AMMONIA in the last 168 hours.  CBC:  Recent Labs Lab 05/17/14 1509 05/17/14 1618 05/19/14 0640  WBC 7.3  --  6.2  NEUTROABS 5.6  --   --   HGB 11.8* 15.6* 9.6*  HCT 39.7 46.0 29.3*  MCV 111.2*  --  101.7*  PLT 142*  --  171    Cardiac Enzymes: No results for input(s): CKTOTAL, CKMB, CKMBINDEX, TROPONINI in the last 168 hours.  BNP: Invalid input(s): POCBNP  CBG:  Recent Labs Lab 05/19/14 1739 05/19/14 2121 05/20/14 0104 05/20/14 0430 05/20/14 0804   GLUCAP 390* 418* 143* 140* 115*    Microbiology: Results for orders placed or performed during the hospital encounter of 05/17/14  MRSA PCR Screening     Status: None   Collection Time: 05/18/14 12:51 AM  Result Value Ref Range Status   MRSA by PCR NEGATIVE NEGATIVE Final    Comment:        The GeneXpert MRSA Assay (FDA approved for NASAL specimens only), is one component of a comprehensive MRSA colonization surveillance program. It is not intended to diagnose MRSA infection nor to guide or monitor treatment for MRSA infections.     Coagulation Studies: No results for input(s): LABPROT, INR in the last 72 hours.  Urinalysis: No results for input(s): COLORURINE, LABSPEC, PHURINE, GLUCOSEU, HGBUR, BILIRUBINUR, KETONESUR, PROTEINUR, UROBILINOGEN, NITRITE, LEUKOCYTESUR in the last 72 hours.  Invalid input(s): APPERANCEUR    Imaging: Mr Herby Abraham Contrast  05/18/2014   CLINICAL DATA:  43 year old diabetic female with end-stage renal disease. Recent stroke with residual right-sided weakness. Presenting to emergency room with persistently elevated blood sugars. Subsequent encounter.  EXAM: MRI HEAD WITHOUT CONTRAST  TECHNIQUE: Multiplanar, multiecho pulse sequences of the brain and surrounding structures were obtained without intravenous contrast.  COMPARISON:  05/17/2014 head CT.  04/18/2014 brain MR.  FINDINGS: No  acute infarct.  Recent left dorsal pons infarct not well delineated on the present exam.  Remote small left globus pallidus infarct.  Mild small vessel disease type changes.  No intracranial hemorrhage.  Mild atrophy. Mild ventricular prominence probably related to atrophy rather than hydrocephalus and unchanged.  Major intracranial vascular structures are patent.  Exophthalmos.  Minimal paranasal sinus mucosal thickening.  Decreased signal intensity of bone marrow may be related to changes of renal disease.  Cervical medullary junction, pineal region and pituitary region  unremarkable.  IMPRESSION: No acute abnormality.  Please see above.   Electronically Signed   By: Chauncey Cruel M.D.   On: 05/18/2014 11:01     Medications:     . aspirin  325 mg Oral Daily  . clonazePAM  1 mg Oral QHS  . gabapentin  100 mg Oral BID  . heparin  5,000 Units Subcutaneous 3 times per day  . Influenza vac split quadrivalent PF  0.5 mL Intramuscular Tomorrow-1000  . insulin aspart  0-9 Units Subcutaneous TID WC  . insulin aspart  6 Units Subcutaneous TID WC  . insulin glargine  7 Units Subcutaneous BID  . lidocaine  1 patch Transdermal Q24H  . metoCLOPramide  5 mg Oral TID AC & HS  . metoprolol succinate  100 mg Oral Daily  . montelukast  10 mg Oral QHS  . multivitamin  1 tablet Oral Daily  . pantoprazole  40 mg Oral QHS  . simvastatin  20 mg Oral QHS  . 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

## 2014-05-20 NOTE — Progress Notes (Signed)
Pt requested a need for refills on home medications. Paged and Spoke with Dr.Vega on the patient's request. Prescriptions were never sent to unit printer. Paged Dr.Vega twice and notified of issue of missing prescriptions. Never received a call back from [place]. Patient refused to wait for call back and requested to leave with family member. Educated pt that they need to make follow up appointments immediatly with there physician to get refills on medication. The patient agreed. Also, pt Blood sugar was 401 right before discharge. Paged Dr. Wendee Beavers on Amion of pt status. Pt refused to wait for Dr.Vega response again requesting to leave. Medication given per sliding scale to cover pt blood sugar.Educated Pt to check blood sugar when they got home and also four times a day. Pt agreed. Pt Alert and Oriented and  no complaints of pain upon discharge. Discharge summary signed.

## 2014-05-20 NOTE — Care Management Note (Signed)
    Page 1 of 1   05/20/2014     2:47:06 PM CARE MANAGEMENT NOTE 05/20/2014  Patient:  SHAYANNA, THATCH   Account Number:  [id number]  Date Initiated:  05/20/2014  Documentation initiated by:  Travonta Gill  Subjective/Objective Assessment:   dx DKA     Action/Plan:   Anticipated DC Date:  05/20/2014   Anticipated DC Plan:  HOME/SELF CARE         Choice offered to / List presented to:             Status of service:  Completed, signed off Medicare Important Message given?  YES (If response is "NO", the following Medicare IM given date fields will be blank) Date Medicare IM given:  05/20/2014 Medicare IM given by:  Annmargaret Decaprio Date Additional Medicare IM given:   Additional Medicare IM given by:    Discharge Disposition:  HOME/SELF CARE  Per UR Regulation:  Reviewed for med. necessity/level of care/duration of stay  If discussed at [place] of Stay Meetings, dates discussed:    Comments:

## 2014-05-20 NOTE — Progress Notes (Signed)
CRITICAL VALUE ALERT  Critical value received:  Blood sugar 401  Date of notification:  05/20/2014 Time of notification: 1300 Critical value read back:Yes.    Nurse who received alert:  Lauris Poag, RN  MD notified (1st page):  Dr.Vega  Time of first page:  40  MD notified (2nd page):  Time of second page:  Responding MD:  See progress note  Time MD responded:

## 2014-05-20 NOTE — Discharge Summary (Signed)
Physician Discharge Summary  [name] SHF:[id number] DOB: 08/05/1970 DOA: 05/17/2014  PCP: [name] Gene, MD  Admit date: 05/17/2014 Discharge date: 05/20/2014  Time spent: > 35 minutes  Recommendations for Outpatient Follow-up:  1. Please see d/c instructions below 2. Will decrease Lantus dose to 5 units sq bid  Discharge Diagnoses:  Principal Problem:   DKA (diabetic ketoacidoses) Active Problems:   ESRD (end stage renal disease)   Hyperlipidemia   GERD (gastroesophageal reflux disease)   RLS (restless legs syndrome)   Hyperkalemia   Gastroparesis   Volume overload   End-stage renal disease on hemodialysis   Recent Left pontine CVA   Diabetic ketoacidosis without coma associated with type 2 diabetes mellitus   Other hypervolemia   Discharge Condition: stable  Diet recommendation: diabetic diet  Filed Weights   05/18/14 0050 05/19/14 1259 05/19/14 1712  Weight: 69.3 kg (152 lb 12.5 oz) 71 kg (156 lb 8.4 oz) 69.1 kg (152 lb 5.4 oz)    History of present illness:  From original HPI: 43 y.o. 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   Pt admitted for DKA and worsening pulmonary edema 2ary to fluid overload from missed dialysis  Hospital Course:  ESRD on HD - Nephrology on board while patient in house - Pt is s/p dialysis. She is to continue her routine schedule on discharge  DKA - resolved, pt transitioned to SQ insulin  DM - hypoglycemic event yesterday which resolved. Will decrease lantus to 5 units SQ BID and continue her home sliding scale insulin - Pt to check her blood sugars 3-4 times per  day  Hyperkalemia - resolved after dialysis sessions  For other known comorbidities above will continue home regimen.  Procedures:  Dialysis  Consultations:  Nephrology  Discharge Exam: Filed Vitals:   05/20/14 0805  BP: 123/66  Pulse: 75  Temp: 98.4 F (36.9 C)  Resp: 18    General: Pt in nad, alert and awake, patient smiling  Cardiovascular: rrr, no mrg Respiratory: cta bl, no wheezes  Discharge Instructions   Discharge Instructions    Call MD for:  extreme fatigue    Complete by:  As directed      Call MD for:  redness, tenderness, or signs of infection (pain, swelling, redness, odor or green/yellow discharge around incision site)    Complete by:  As directed      Call MD for:  severe uncontrolled pain    Complete by:  As directed      Call MD for:  temperature >100.4    Complete by:  As directed      Diet - low sodium heart healthy    Complete by:  As directed      Discharge instructions    Complete by:  As directed   Please monitor your blood sugars 3-4 times daily. Adhere to a diabetic diet once discharged.  Follow up with you Nephrologist as per routine schedule after discharge.     Increase activity slowly    Complete by:  As directed           Current Discharge Medication List    START taking these medications   Details  insulin glargine (LANTUS) 100 UNIT/ML injection Inject 0.05 mLs (5 Units total) into the skin 2 (two) times daily. Qty: 10 mL, Refills: 11      CONTINUE these medications which have NOT CHANGED   Details  albuterol (PROVENTIL HFA;VENTOLIN HFA) 108 (90 BASE) MCG/ACT inhaler Inhale 2 puffs into the lungs every 4 (four) hours as needed for wheezing or shortness of breath (as per home regimen). Qty: 1 Inhaler, Refills: 0    aspirin 325 MG tablet Take 1 tablet (325 mg total) by mouth daily.    calcium acetate (PHOSLO) 667 MG capsule Take 2,001 mg by mouth 3 (three) times daily with meals.    clonazePAM (KLONOPIN) 0.5 MG tablet  Take 1 mg by mouth at bedtime.     gabapentin (NEURONTIN) 100 MG capsule Take 100 mg by mouth 2 (two) times daily.     insulin aspart (NOVOLOG) 100 UNIT/ML injection Inject 0-9 Units into the skin 3 (three) times daily with meals. Qty: 10 mL, Refills: 0    Insulin Glargine (LANTUS) 100 UNIT/ML Solostar Pen Inject 7 Units into the skin 2 (two) times daily with a meal. Qty: 15 mL, Refills: 11    lidocaine (LIDODERM) 5 % Place 1 patch onto the skin daily. Remove & Discard patch within 12 hours or as directed by MD Qty: 30 patch, Refills: 0    metoCLOPramide (REGLAN) 5 MG tablet Take 1 tablet (5 mg total) by mouth 4 (four) times daily -  before meals and at bedtime. Qty: 60 tablet, Refills: 0    montelukast (SINGULAIR) 10 MG tablet Take 10 mg by mouth at bedtime.    multivitamin (RENA-VIT) TABS tablet Take 1 tablet by mouth daily.    Nutritional Supplements (FEEDING SUPPLEMENT, NEPRO CARB STEADY,) LIQD Take 237 mLs by mouth 2 (two) times daily between meals. Qty: 60 Can, Refills: 0    pantoprazole (PROTONIX) 40 MG tablet Take 1 tablet (40 mg total) by mouth at bedtime. Qty: 30 tablet, Refills: 0    promethazine (PHENERGAN) 25 MG tablet Take 25 mg by mouth every 6 (six) hours as needed for nausea or vomiting.    simvastatin (ZOCOR) 20 MG tablet Take 1 tablet (20 mg total) by mouth at bedtime. Qty: 30 tablet, Refills: 0    zolpidem (AMBIEN) 5 MG tablet Take 5 mg by mouth at bedtime.    metoprolol succinate (TOPROL-XL) 100 MG 24 hr tablet Take 100 mg by mouth daily. Take with or immediately following a meal.    oxyCODONE-acetaminophen (PERCOCET/ROXICET) 5-325 MG per tablet Take 1 tablet by mouth every 6 (six) hours as needed for moderate pain or severe pain. Qty: 15 tablet, Refills: 0      STOP taking these medications     lidocaine-prilocaine (EMLA) cream      ondansetron (ZOFRAN-ODT) 4 MG disintegrating tablet        Allergies  Allergen Reactions  . Depakote [Divalproex  Sodium] Other (See Comments)    Pt gets "delirious"  . Morphine And Related Nausea And Vomiting and Other (See Comments)    "makes me delirious"  . Penicillins Anaphylaxis    Tolerated Zosyn Dec 2014  . Tramadol Nausea And Vomiting  . Imitrex [Sumatriptan] Other (See Comments)    seizures  . Vicodin [Hydrocodone-Acetaminophen] Itching and Rash      The results of significant diagnostics from this hospitalization (including imaging, microbiology, ancillary and laboratory) are listed below for reference.    Significant Diagnostic Studies: Dg Chest 2 View  05/17/2014  CLINICAL DATA:  Hyperglycemia for 3 days. History of diabetes. Initial encounter.  EXAM: CHEST  2 VIEW  COMPARISON:  04/13/2014 and 04/14/2014.  FINDINGS: 1747 hr. Right IJ central venous catheter projects to the SVC right atrial level. The heart is enlarged. Pulmonary edema has mildly worsened. There is no confluent airspace opacity, significant pleural effusion or pneumothorax. The bones appear stable. Telemetry leads overlie the upper abdomen.  IMPRESSION: Worsened pulmonary edema consistent with congestive heart failure or volume overload. Stable cardiomegaly.   Electronically Signed   By: Camie Patience M.D.   On: 05/17/2014 18:14   Ct Head Wo Contrast  05/18/2014   CLINICAL DATA:  Initial evaluation for right-sided weakness and numbness. History of prior stroke.  EXAM: CT HEAD WITHOUT CONTRAST  TECHNIQUE: Contiguous axial images were obtained from the base of the skull through the vertex without intravenous contrast.  COMPARISON:  Prior MRI from 04/18/2014.  FINDINGS: Cerebral volume within normal limits for patient age. Patchy hypodensity within the periventricular and deep white matter both cerebral hemispheres consistent with chronic small vessel ischemic disease.  No acute cortically based or large vessel territory infarct. Remote lacunar infarct present within the left basal ganglia. Basal ganglia calcifications noted. No  intracranial hemorrhage.  No mass lesion or midline shift. No hydrocephalus. No extra-axial fluid collection.  Calvarium intact.  Scalp soft tissues within normal limits. No acute abnormality seen about the orbits. Calcification at the superior left orbit is stable.  Paranasal sinuses and mastoid air cells are clear.  IMPRESSION: 1. No acute intracranial hemorrhage or large vessel territory infarct. 2. Chronic small vessel ischemic disease with remote lacunar infarct within the left basal ganglia.   Electronically Signed   By: Jeannine Boga M.D.   On: 05/18/2014 00:38   Mr [name] Contrast  05/18/2014   CLINICAL DATA:  43 year old diabetic female with end-stage renal disease. Recent stroke with residual right-sided weakness. Presenting to emergency room with persistently elevated blood sugars. Subsequent encounter.  EXAM: MRI HEAD WITHOUT CONTRAST  TECHNIQUE: Multiplanar, multiecho pulse sequences of the brain and surrounding structures were obtained without intravenous contrast.  COMPARISON:  05/17/2014 head CT.  04/18/2014 brain MR.  FINDINGS: No acute infarct.  Recent left dorsal pons infarct not well delineated on the present exam.  Remote small left globus pallidus infarct.  Mild small vessel disease type changes.  No intracranial hemorrhage.  Mild atrophy. Mild ventricular prominence probably related to atrophy rather than hydrocephalus and unchanged.  Major intracranial vascular structures are patent.  Exophthalmos.  Minimal paranasal sinus mucosal thickening.  Decreased signal intensity of bone marrow may be related to changes of renal disease.  Cervical medullary junction, pineal region and pituitary region unremarkable.  IMPRESSION: No acute abnormality.  Please see above.   Electronically Signed   By: Chauncey Cruel M.D.   On: 05/18/2014 11:01   Ir Fluoro Guide Cv Line Right  05/17/2014   CLINICAL DATA:  End-stage renal disease, hyperglycemia  EXAM: RIGHT INTERNAL JUGULAR PICC LINE PLACEMENT  WITH ULTRASOUND AND FLUOROSCOPIC GUIDANCE  FLUOROSCOPY TIME:  30 SECONDS  PROCEDURE: The patient was advised of the possible risks andcomplications and agreed to undergo the procedure. The patient was then brought to the angiographic suite for the procedure.  The RIGHT NECK was prepped with chlorhexidine, drapedin the usual sterile fashion using maximum barrier technique (cap and mask, sterile gown, sterile gloves, large sterile sheet, hand hygiene and cutaneous antisepsis) and infiltrated locally with 1% Lidocaine.  Ultrasound demonstrated patency of  the right internal jugular vein, and this was documented with an image. Under real-time ultrasound guidance, this vein was accessed with a 21 gauge micropuncture needle and image documentation was performed. A 0.018 wire was introduced in to the vein. Over this, a 5 [place] double lumen power PICC was advanced to the lower SVC/right atrial junction. Fluoroscopy during the procedure and fluoro spot radiograph confirms appropriate catheter position. The catheter was flushed and covered with asterile dressing.  Catheter length: 15  Complications: None immediate  IMPRESSION: Successful right internal jugular double-lumen Power PICC line placement with ultrasound and fluoroscopic guidance. The catheter is ready for use.   Electronically Signed   By: Daryll Brod M.D.   On: 05/17/2014 17:10   Ir US Guide Vasc Access Right  05/17/2014   CLINICAL DATA:  End-stage renal disease, hyperglycemia  EXAM: RIGHT INTERNAL JUGULAR PICC LINE PLACEMENT WITH ULTRASOUND AND FLUOROSCOPIC GUIDANCE  FLUOROSCOPY TIME:  30 SECONDS  PROCEDURE: The patient was advised of the possible risks andcomplications and agreed to undergo the procedure. The patient was then brought to the angiographic suite for the procedure.  The RIGHT NECK was prepped with chlorhexidine, drapedin the usual sterile fashion using maximum barrier technique (cap and mask, sterile gown, sterile gloves, large sterile sheet,  hand hygiene and cutaneous antisepsis) and infiltrated locally with 1% Lidocaine.  Ultrasound demonstrated patency of the right internal jugular vein, and this was documented with an image. Under real-time ultrasound guidance, this vein was accessed with a 21 gauge micropuncture needle and image documentation was performed. A 0.018 wire was introduced in to the vein. Over this, a 5 [place] double lumen power PICC was advanced to the lower SVC/right atrial junction. Fluoroscopy during the procedure and fluoro spot radiograph confirms appropriate catheter position. The catheter was flushed and covered with asterile dressing.  Catheter length: 15  Complications: None immediate  IMPRESSION: Successful right internal jugular double-lumen Power PICC line placement with ultrasound and fluoroscopic guidance. The catheter is ready for use.   Electronically Signed   By: Daryll Brod M.D.   On: 05/17/2014 17:10    Microbiology: Recent Results (from the past 240 hour(s))  MRSA PCR Screening     Status: None   Collection Time: 05/18/14 12:51 AM  Result Value Ref Range Status   MRSA by PCR NEGATIVE NEGATIVE Final    Comment:        The GeneXpert MRSA Assay (FDA approved for NASAL specimens only), is one component of a comprehensive MRSA colonization surveillance program. It is not intended to diagnose MRSA infection nor to guide or monitor treatment for MRSA infections.      Labs: Basic Metabolic Panel:  Recent Labs Lab 05/17/14 1806 05/17/14 1950 05/18/14 0105 05/18/14 0354 05/19/14 0640  NA 117* 136* 136 136 132*  K 5.6* 3.3* 3.3* 2.9* 3.9  CL 75* 92* 100 101 96  CO2 15* 22 27 30 20   GLUCOSE 1237* 514* 205* 79 342*  BUN 55* 30* 11 13 32*  CREATININE 9.44* 5.40* 3.85* 4.35* 7.37*  CALCIUM 6.9* 7.3* 8.1* 7.7* 7.3*   Liver Function Tests: No results for input(s): AST, ALT, ALKPHOS, BILITOT, PROT, ALBUMIN in the last 168 hours. No results for input(s): LIPASE, AMYLASE in the last 168  hours. No results for input(s): AMMONIA in the last 168 hours. CBC:  Recent Labs Lab 05/17/14 1509 05/17/14 1618 05/19/14 0640  WBC 7.3  --  6.2  NEUTROABS 5.6  --   --   HGB 11.8*  15.6* 9.6*  HCT 39.7 46.0 29.3*  MCV 111.2*  --  101.7*  PLT 142*  --  171   Cardiac Enzymes: No results for input(s): CKTOTAL, CKMB, CKMBINDEX, TROPONINI in the last 168 hours. BNP: BNP (last 3 results)  Recent Labs  06/27/13 1510 01/14/14 0216  PROBNP 5305.0* 17541.0*   CBG:  Recent Labs Lab 05/19/14 1739 05/19/14 2121 05/20/14 0104 05/20/14 0430 05/20/14 0804  GLUCAP 390* 418* 143* 140* 115*       Signed:  Velvet Bathe  [facility] 05/20/2014, 10:05 AM

## 2014-05-24 LAB — HEPATITIS B SURFACE ANTIGEN: Hepatitis B Surface Ag: NEGATIVE

## 2014-06-10 ENCOUNTER — Encounter (HOSPITAL_COMMUNITY): Payer: Self-pay | Admitting: Gastroenterology

## 2014-07-01 IMAGING — CR DG CHEST 2V
2 series · 2 of 2 positions shown · non-contrast
Comparison: 12/08/2011

CLINICAL DATA: Cough, shortness of breath, history hypertension,
diabetes, end-stage renal disease, asthma

CHEST - 2 VIEW

[w chest pa]
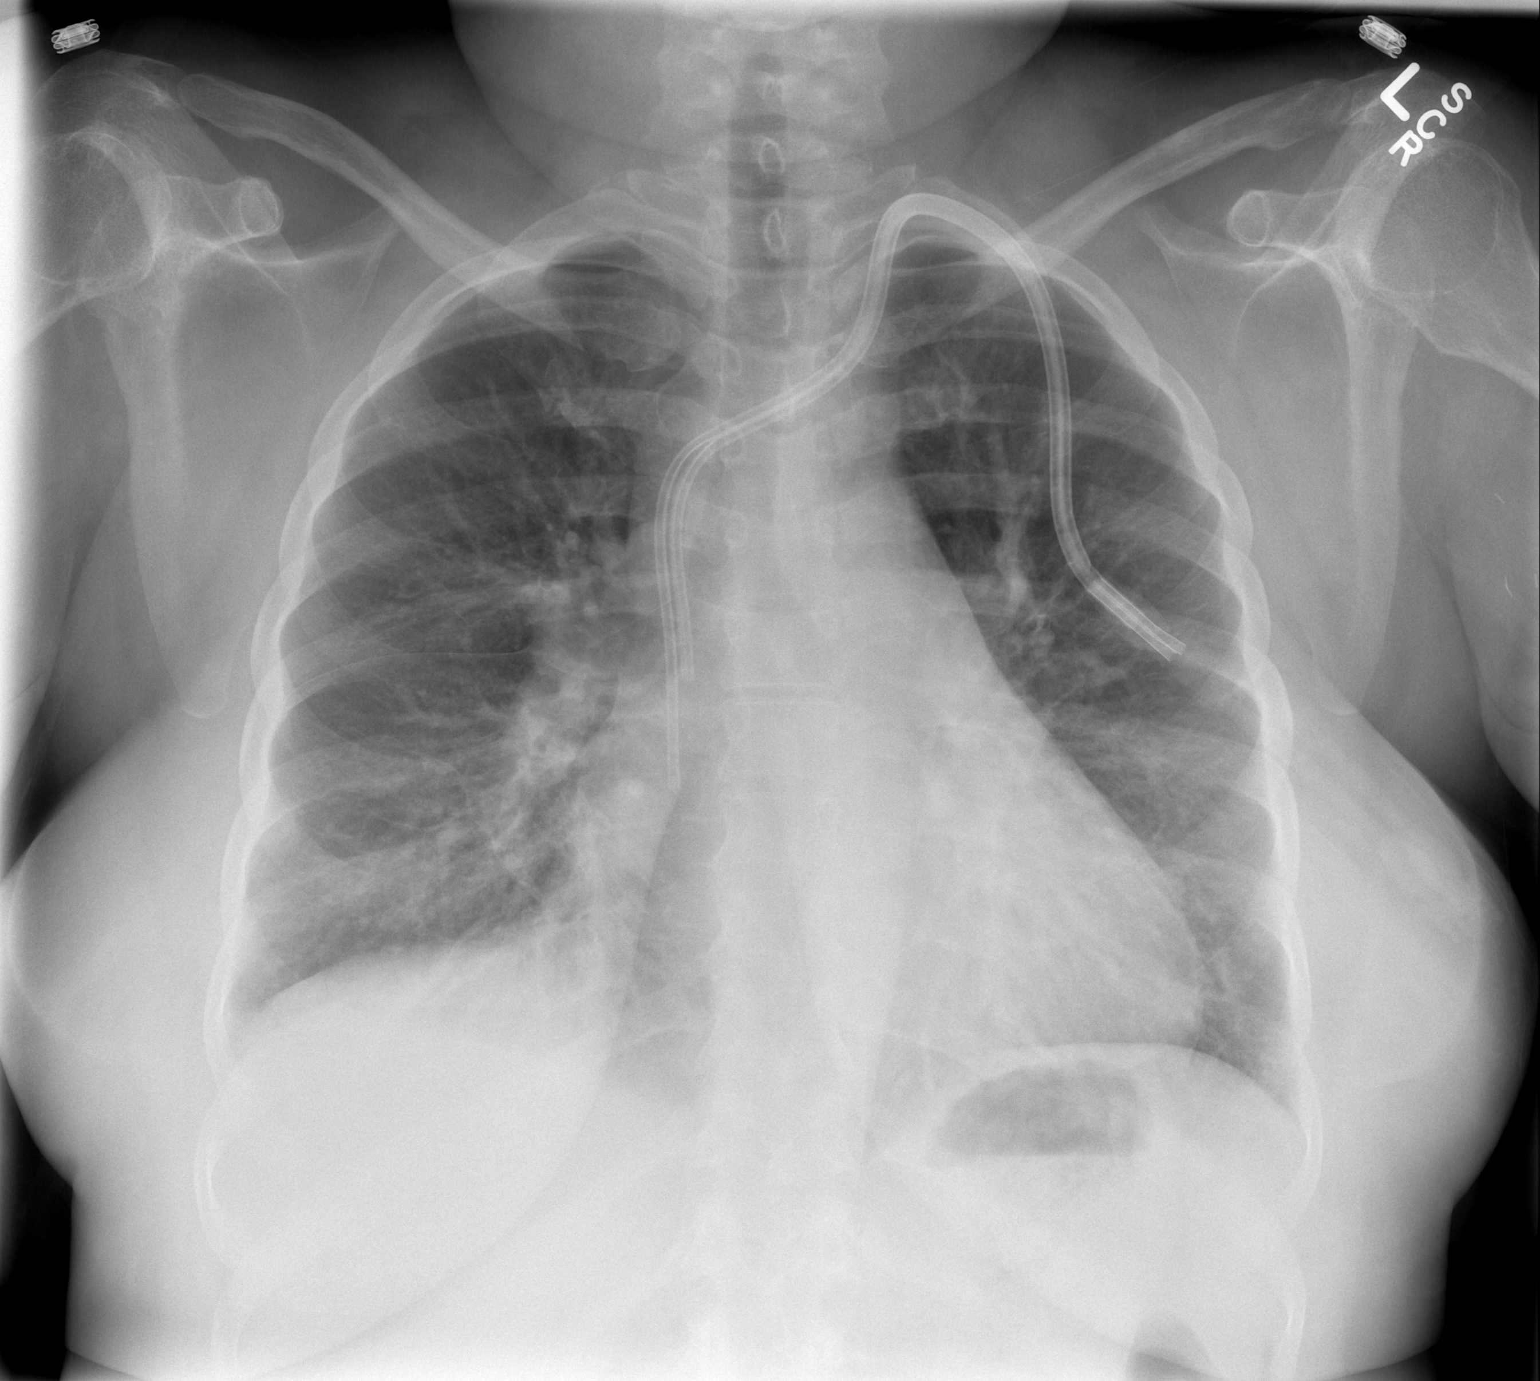

[w chest lat]
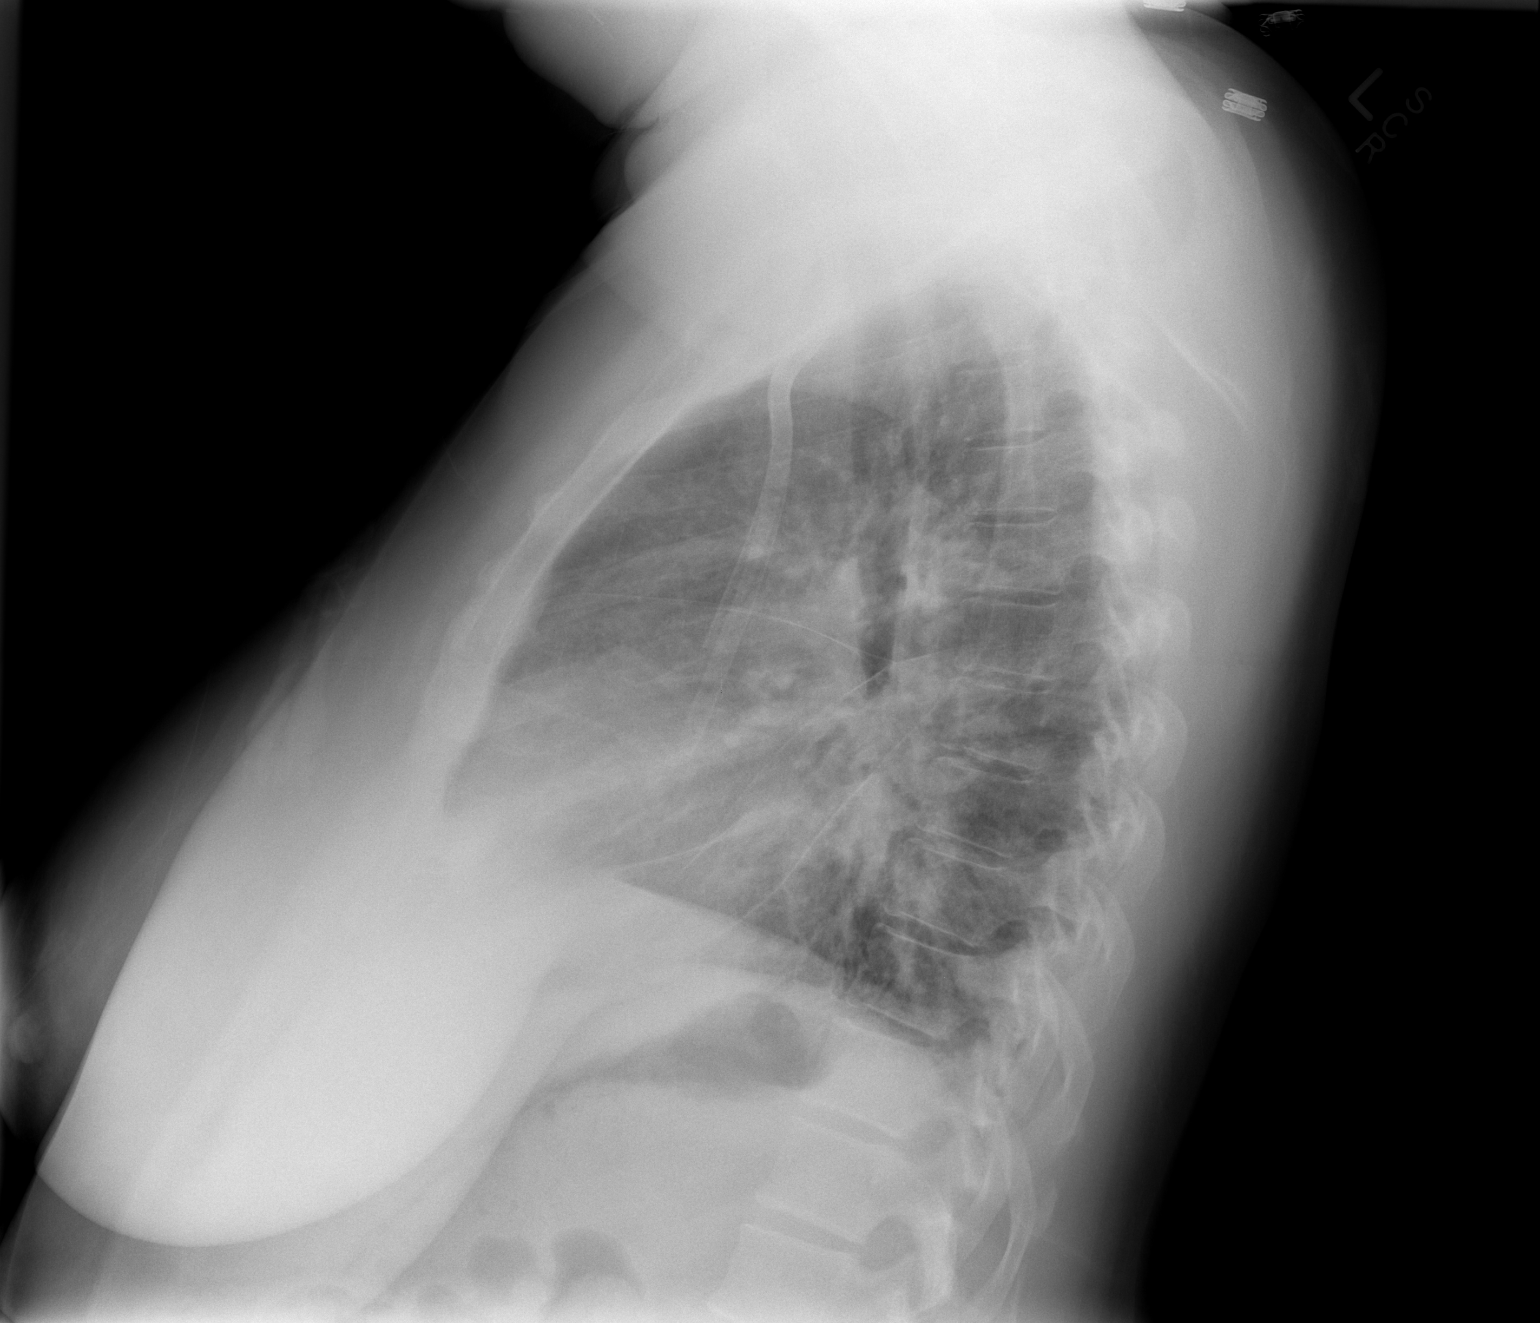

[2 of 2 positions shown; findings below may reference images not displayed]

FINDINGS: Left jugular dual-lumen central venous catheter with distal tip
projecting over cavoatrial junction.
Enlargement of cardiac silhouette with pulmonary vascular
congestion.
Question minimal perihilar edema particularly on the right.
No gross pleural effusion or pneumothorax.
Bones diffusely demineralized.
IMPRESSION: Enlargement of cardiac silhouette with pulmonary vascular
congestion and question minimal pulmonary edema/failure.

## 2014-07-02 IMAGING — CT CT ABD-PELV W/O CM
2 of 4 series · 14 of 32 positions shown, 19 images · non-contrast
Comparison: 09/14/2010.

CLINICAL DATA: Nausea and vomiting for 2 days.  Elevated blood
sugars.  Fevers.

CT ABDOMEN AND PELVIS WITHOUT CONTRAST
TECHNIQUE: Multidetector CT imaging of the abdomen and pelvis was
performed following the standard protocol without intravenous
contrast.

[Series 2: routine abdomen · axial · 0.85mm/px · z∈[-357,-57]mm · 6 of 86 slices shown, 11 images]
[im 13/86  soft-tissue]
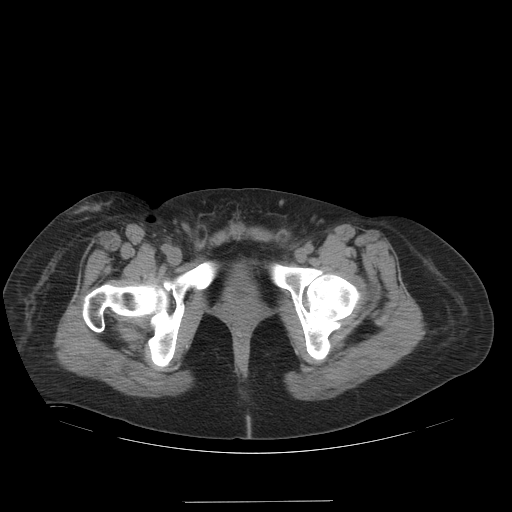
[im 13/86  bone]
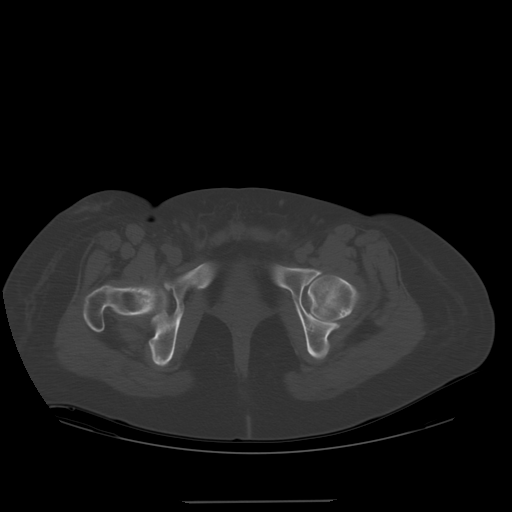
[im 25/86  soft-tissue]
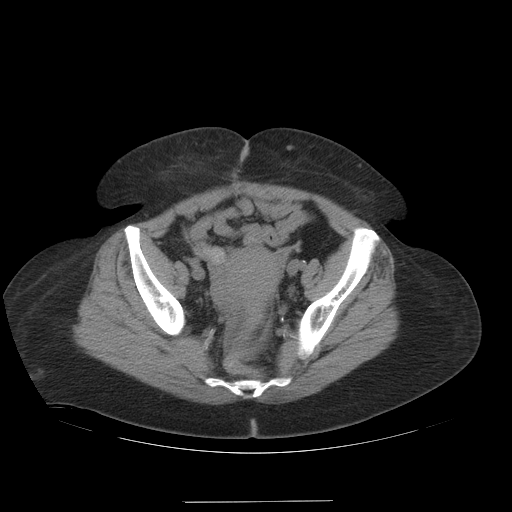
[im 37/86  soft-tissue]
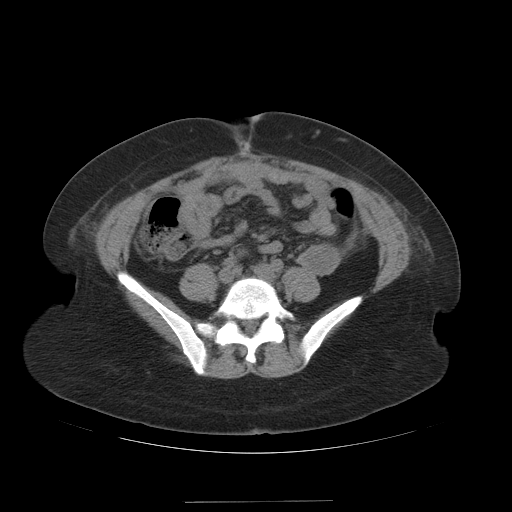
[im 37/86  lung]
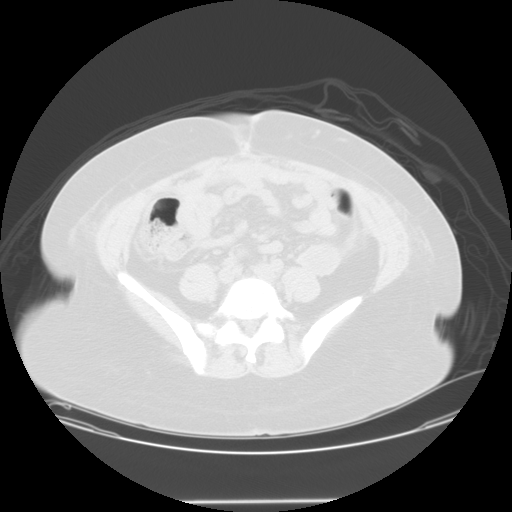
[im 49/86  soft-tissue]
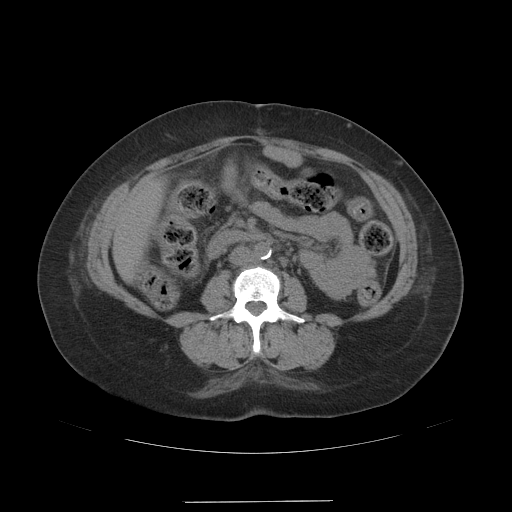
[im 49/86  lung]
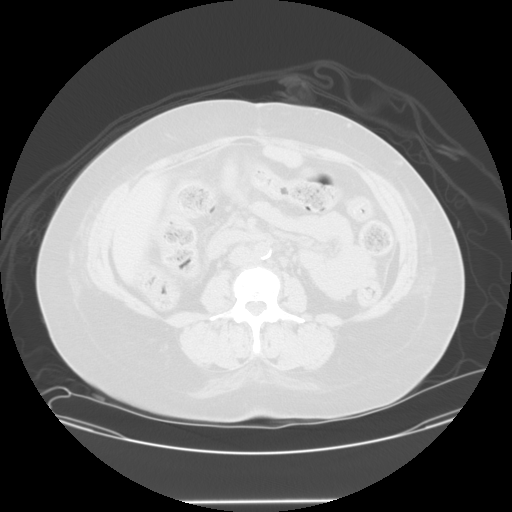
[im 61/86  soft-tissue]
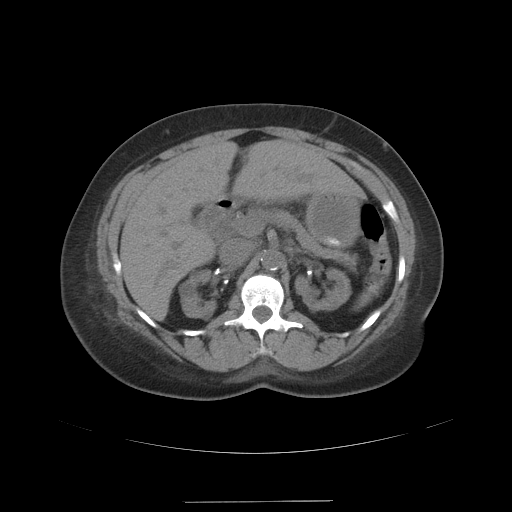
[im 61/86  lung]
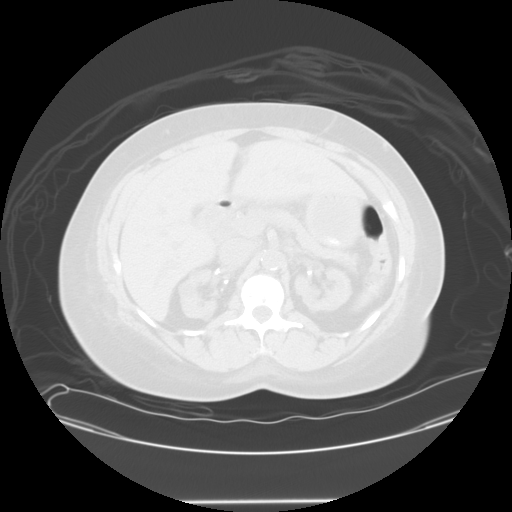
[im 73/86  soft-tissue]
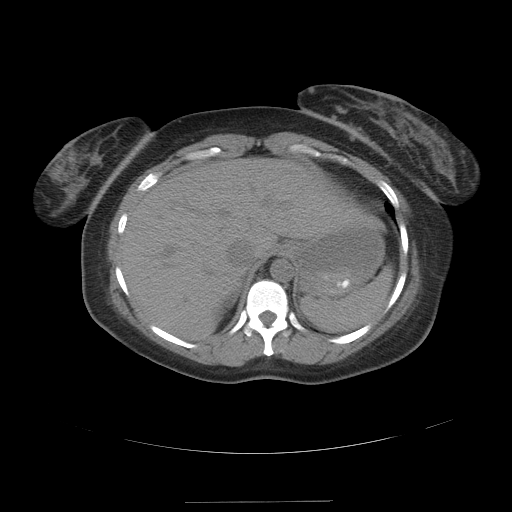
[im 73/86  lung]
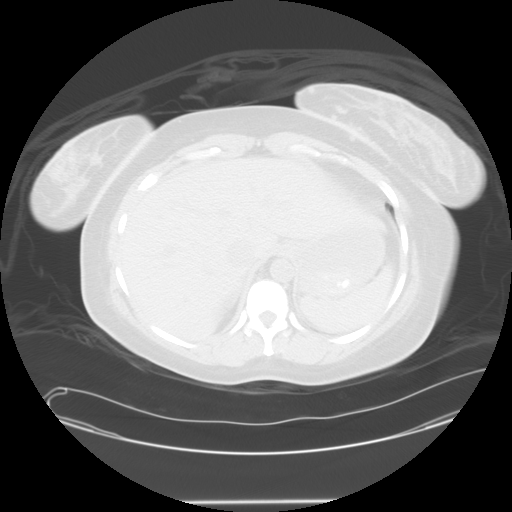

[Series 401: sag · sagittal · 0.85mm/px · 8 of 122 slices shown]
[im 12/122  soft-tissue]
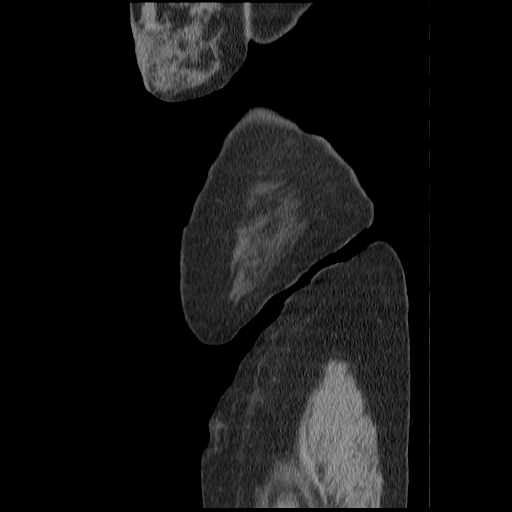
[im 23/122  soft-tissue]
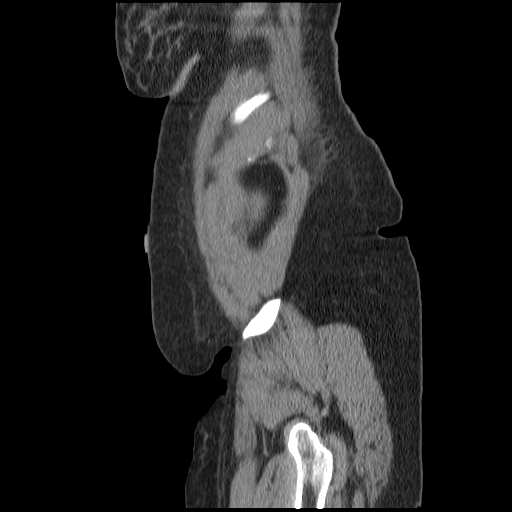
[im 45/122  soft-tissue]
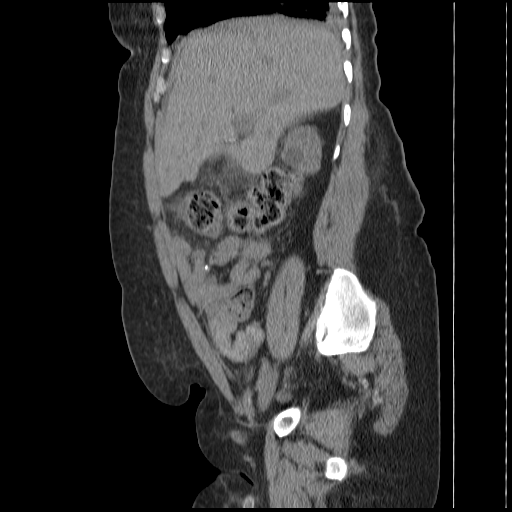
[im 56/122  soft-tissue]
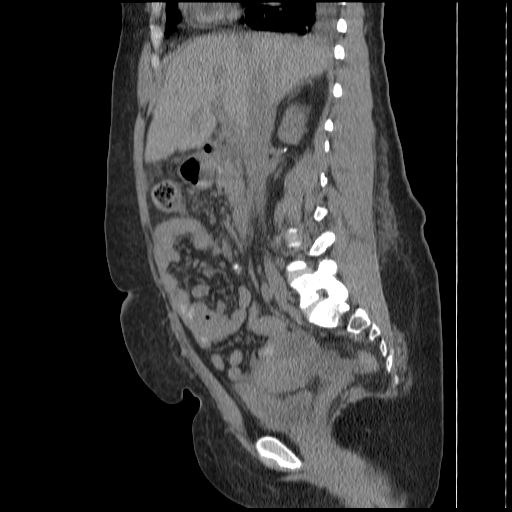
[im 67/122  soft-tissue]
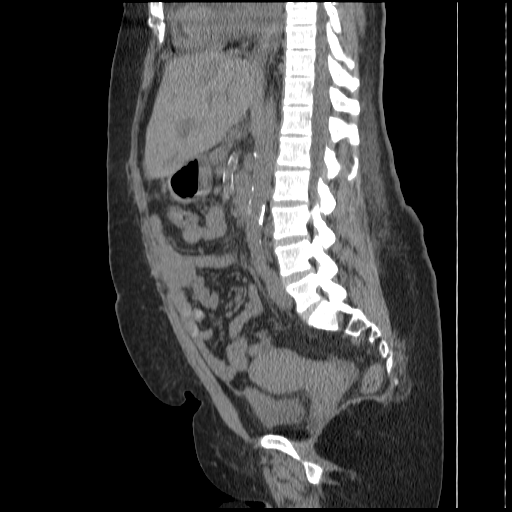
[im 78/122  soft-tissue]
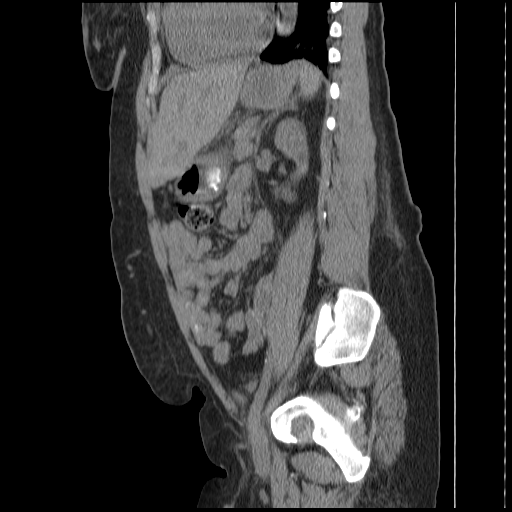
[im 100/122  soft-tissue]
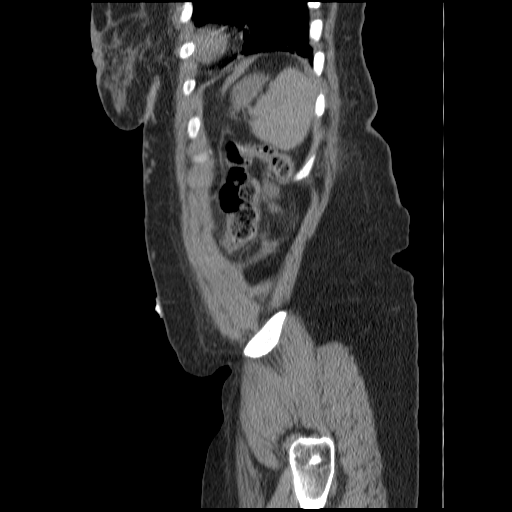
[im 111/122  soft-tissue]
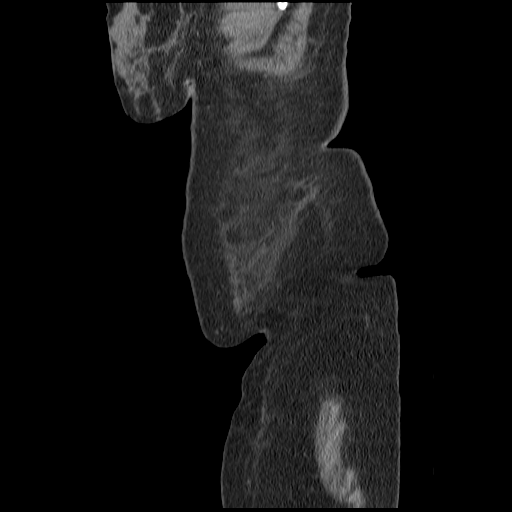

[14 of 32 positions shown; findings below may reference images not displayed]

FINDINGS: The small right pleural effusion.  Diffuse airspace
infiltration noted in the lung bases.  Changes could represent
edema or pneumonia.  Mild cardiac enlargement.  There appears to be
a small esophageal hiatal hernia with opaque densities in the
distal esophagus likely representing opaque medication.  Opaque
medication also present in the stomach.

Surgical absence of the gallbladder.  The unenhanced appearance of
the liver, spleen, pancreas, adrenal glands, and retroperitoneal
lymph nodes is unremarkable. The kidneys are atrophic and appear to
be a cyst filled.  No hydronephrosis.  Vascular calcifications.
Calcification of the aorta without aneurysm.  The stomach, small
bowel, and colon are not abnormally distended.  There are
postoperative changes in the right lower quadrant correlating with
history of pancreatic transplantation.  There is mild diffuse
thickening of the wall of the colon with hazy pericolonic
infiltration and infiltration in the omentum.  Changes suggest
diffuse colitis. No free air or free fluid in the abdomen.

Pelvis:  There is a small amount of free fluid in the pelvis.  The
uterus and adnexal structures are not significantly enlarged.
Rounded structure in the left pelvis measuring 2.5 x 3.3 cm likely
represents transplant kidney and is decreasing in size since the
previous study. No loculated pelvic fluid collections.  The
appendix is normal.  No diverticulitis.
IMPRESSION: Diffuse colonic wall thickening and pericolonic infiltration
suggesting pan colitis.  Small right pleural effusion.  Airspace
infiltration in both lung bases.

## 2014-07-24 IMAGING — CR DG CHEST 2V
2 series · 2 of 2 positions shown · non-contrast
Comparison: 06/10/2012.

CLINICAL DATA: Dialysis patient with cough and shortness of breath.

CHEST - 2 VIEW

[w chest pa]
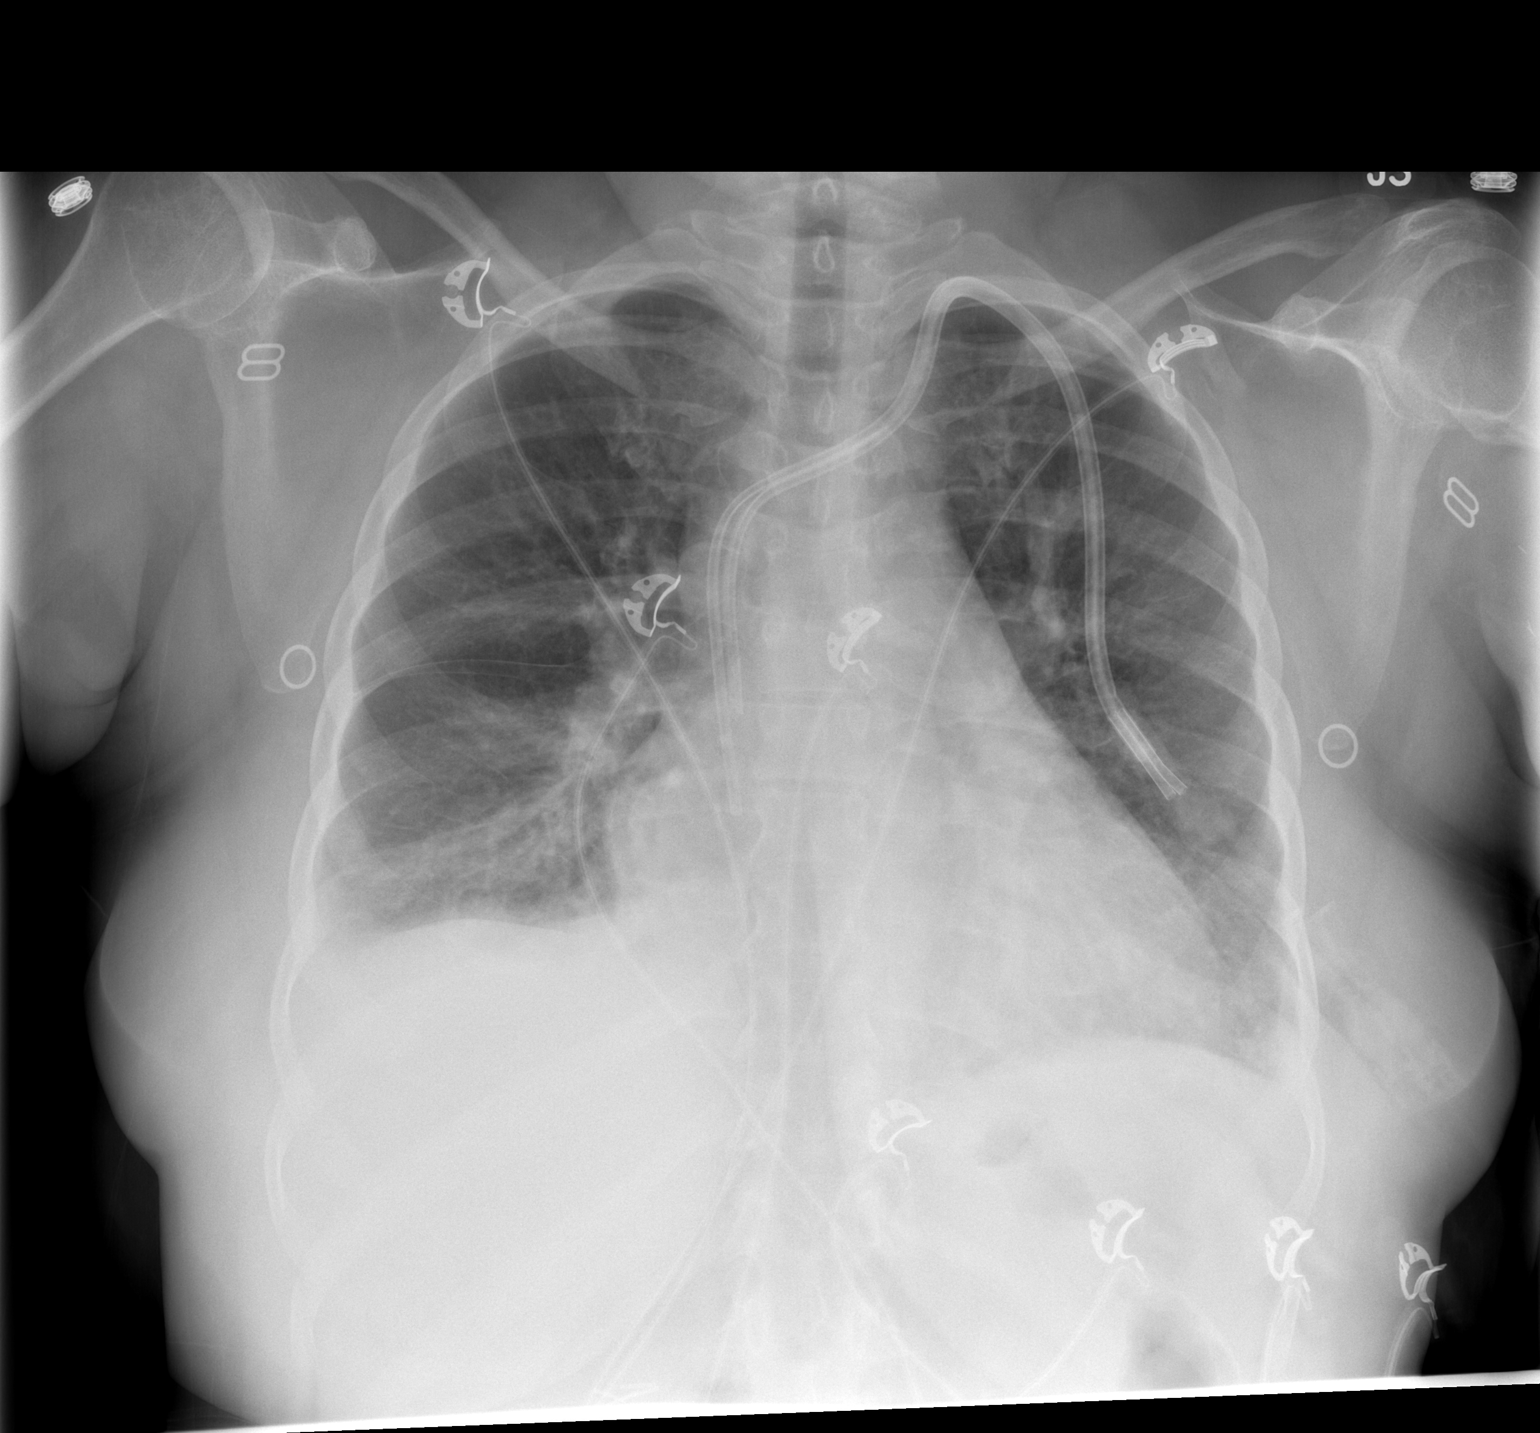

[w chest lat]
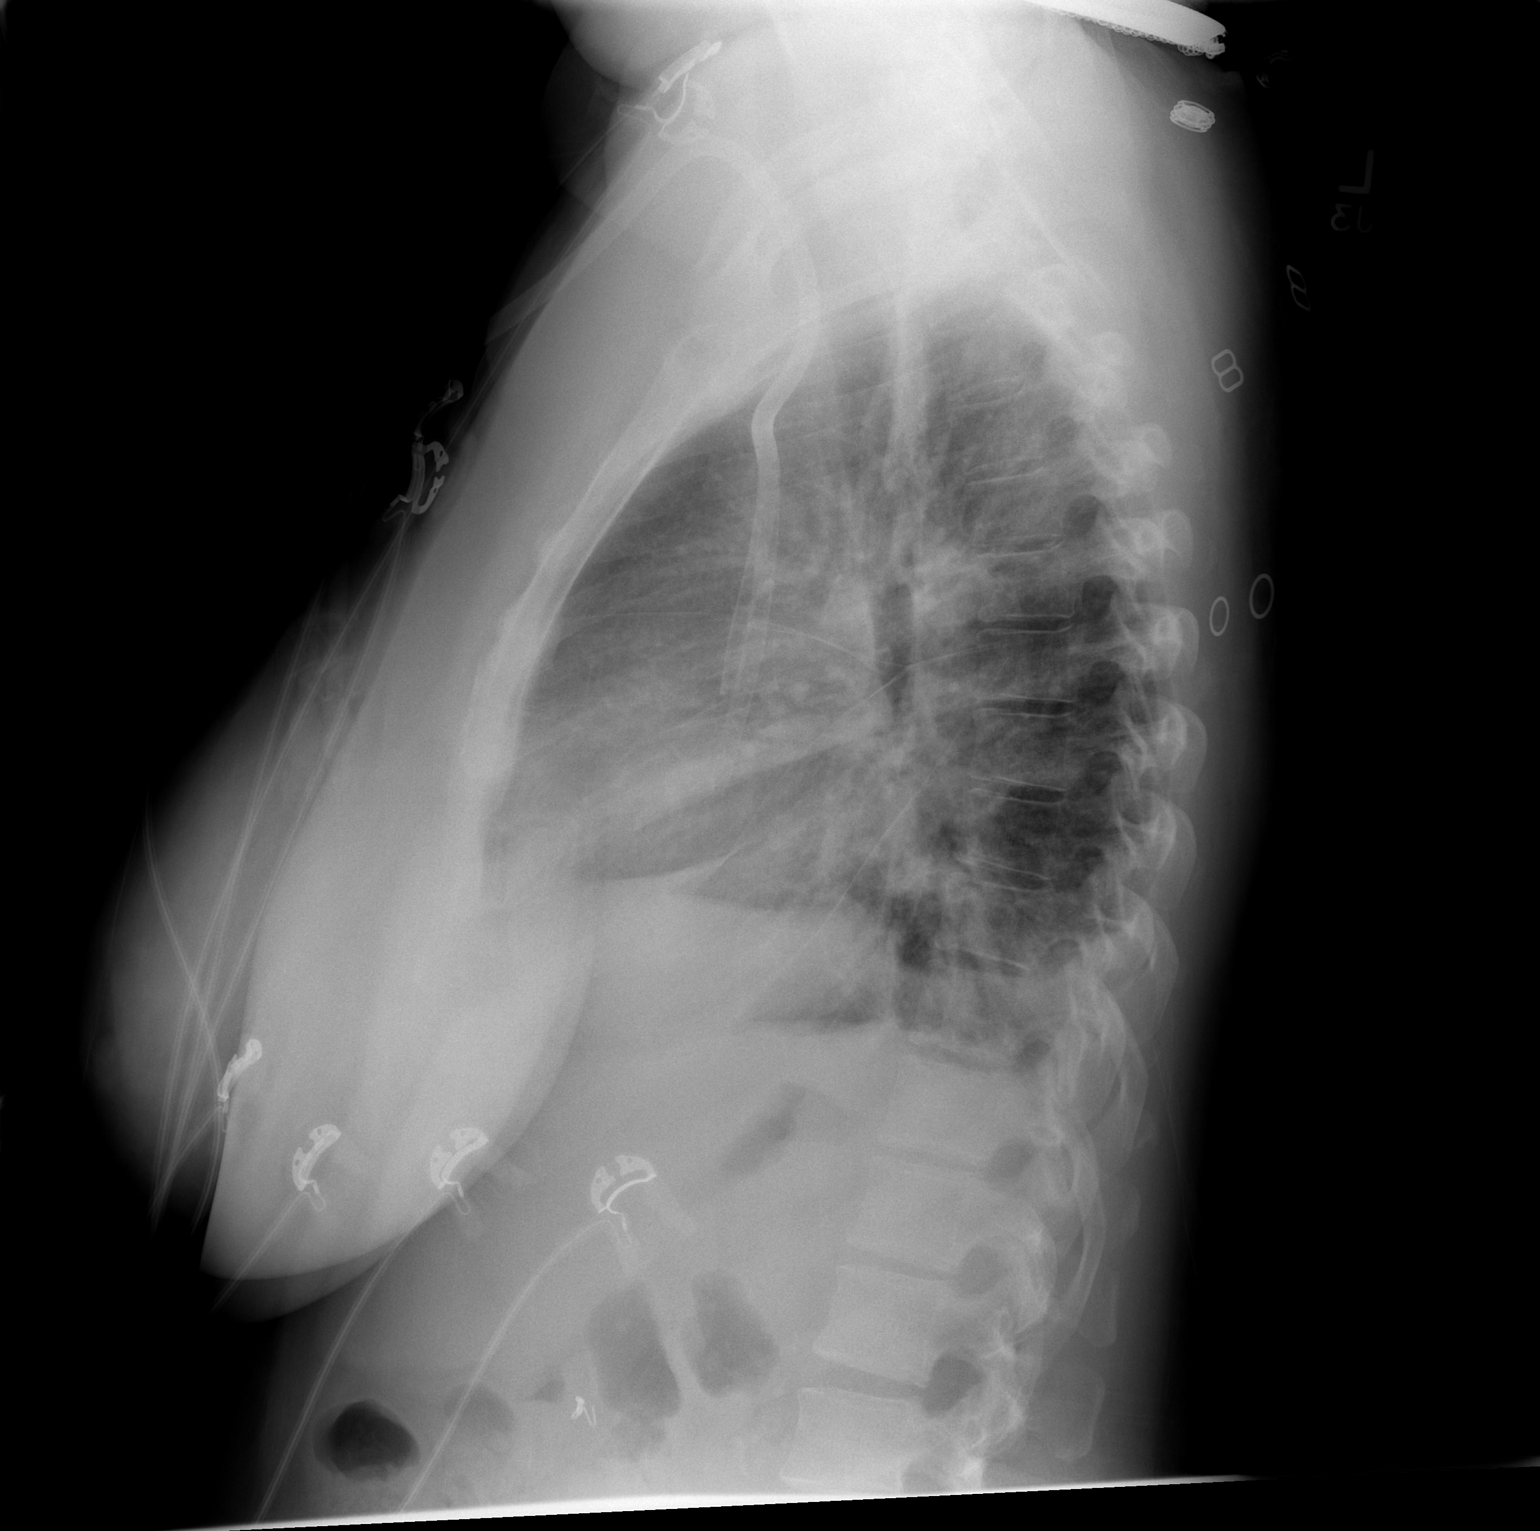

[2 of 2 positions shown; findings below may reference images not displayed]

FINDINGS: Left IJ dialysis catheters are unchanged near the SVC
right atrial junction.  There is stable cardiomegaly.  There are
recurrent basilar air space opacities associated with small
bilateral pleural effusions.  The osseous structures appear
unchanged.  Cholecystectomy clips are noted.
IMPRESSION: Recurrent basilar air space opacities and bilateral pleural
effusions most consistent with recurrent congestive heart failure
or volume overload.  Early infection not excluded.

## 2014-07-25 IMAGING — US US ABDOMEN COMPLETE
1 series · 14 of 25 positions shown · non-contrast
Comparison: CT abdomen and pelvis 06/10/2012

CLINICAL DATA: Chest pain with meals, history diabetes, renal
disease, hypertension, emphysema, prior renal and pancreatic
transplant

ULTRASOUND ABDOMEN:
TECHNIQUE: Sonography of upper abdominal structures was performed.

[Series 1: us abdomen complete · 0.30mm/px · 14 of 61 slices shown]
[im 1/61]
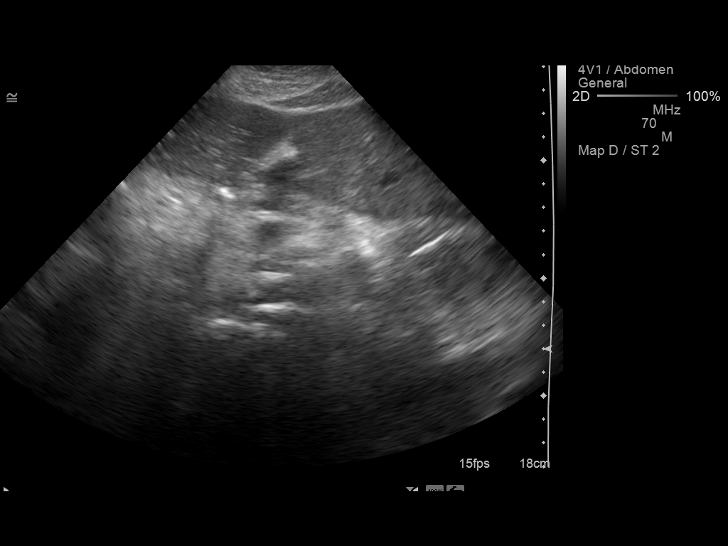
[im 6/61]
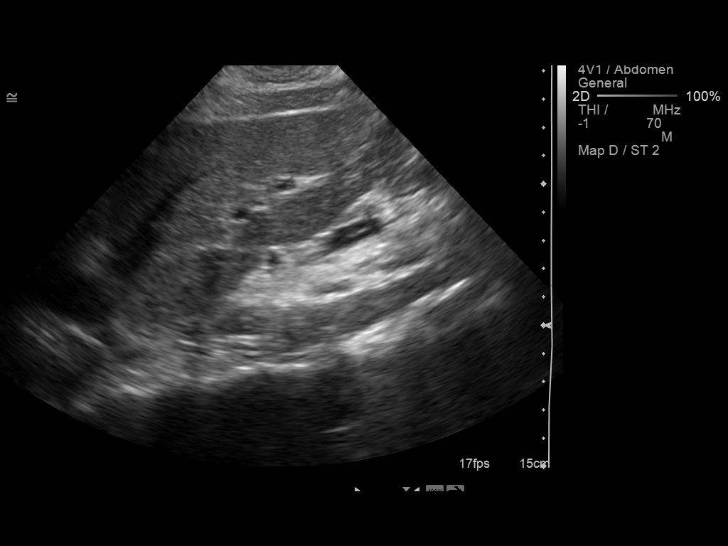
[im 11/61]
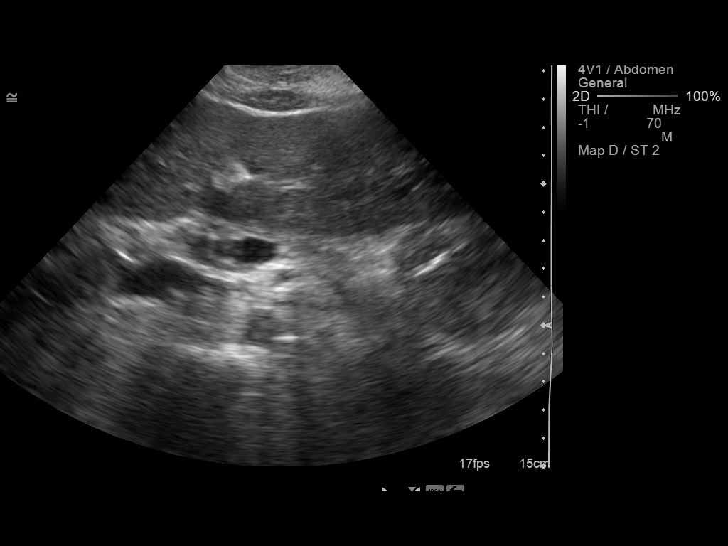
[im 16/61]
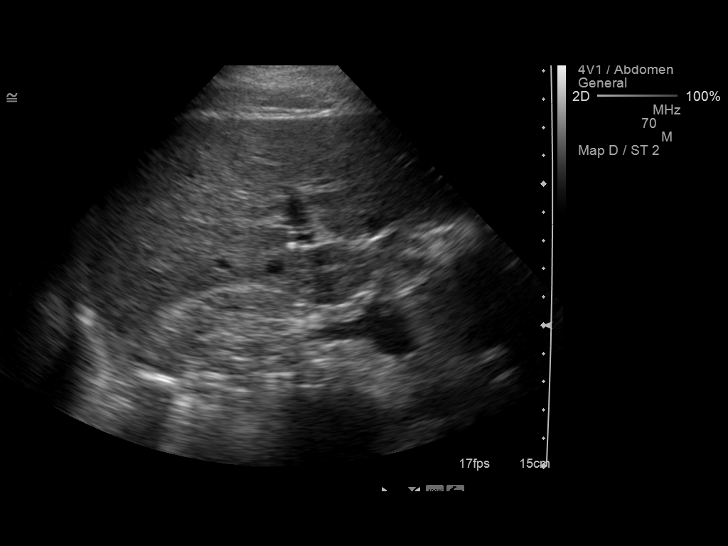
[im 21/61]
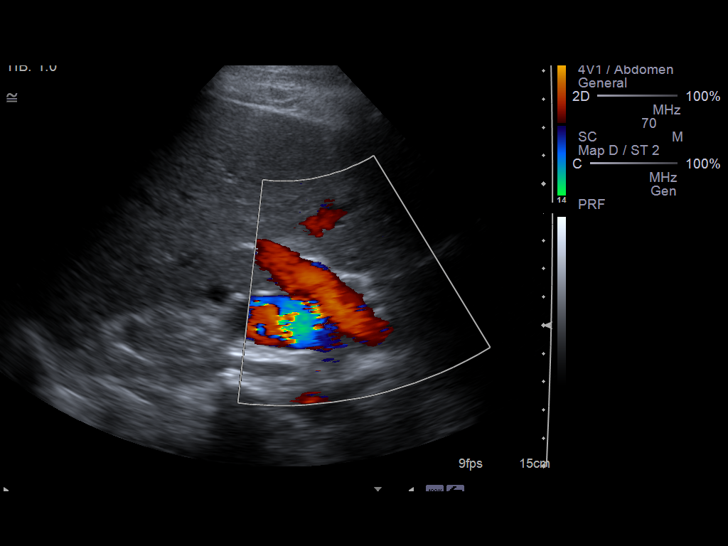
[im 23/61]
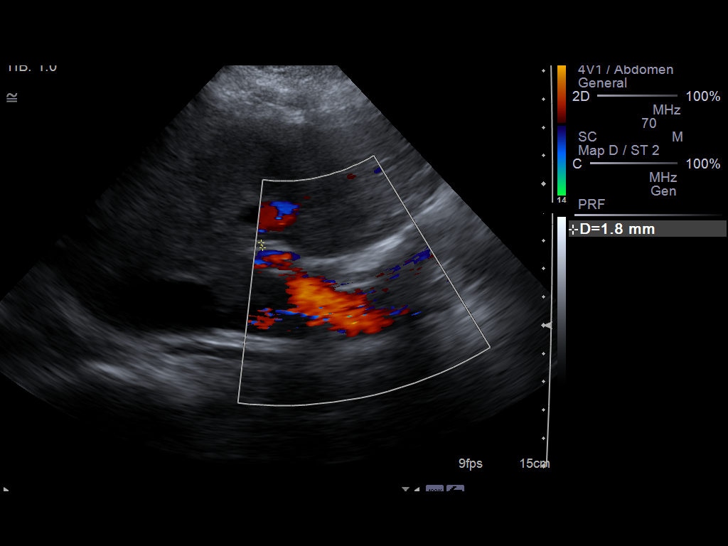
[im 28/61]
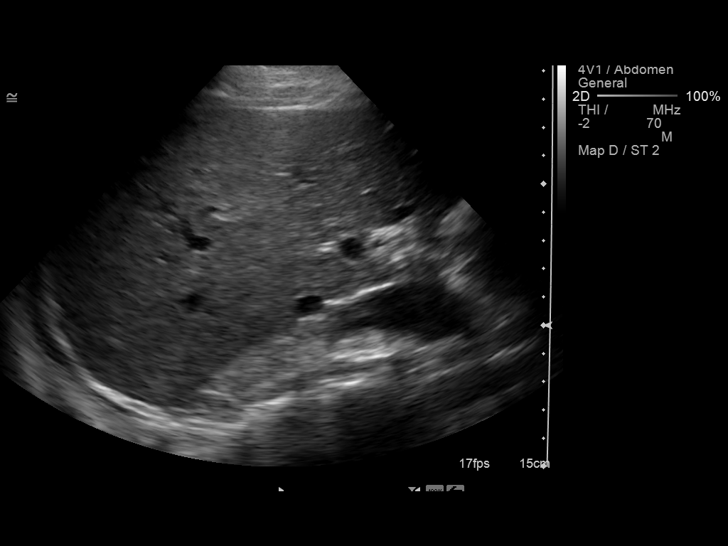
[im 33/61]
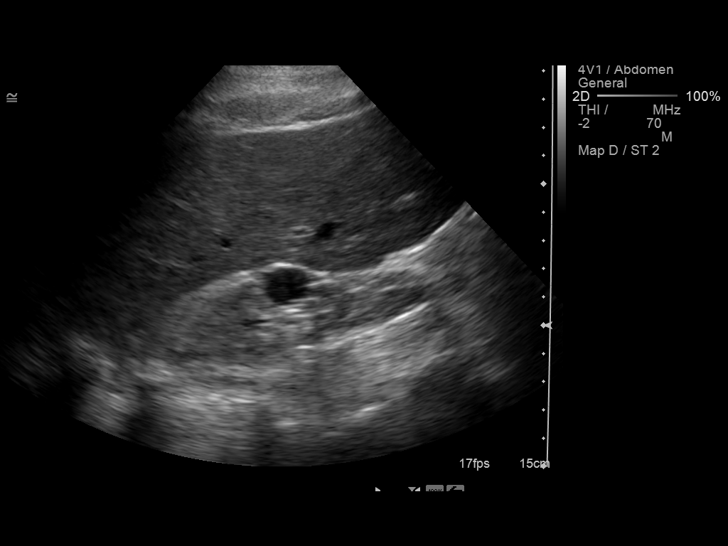
[im 38/61]
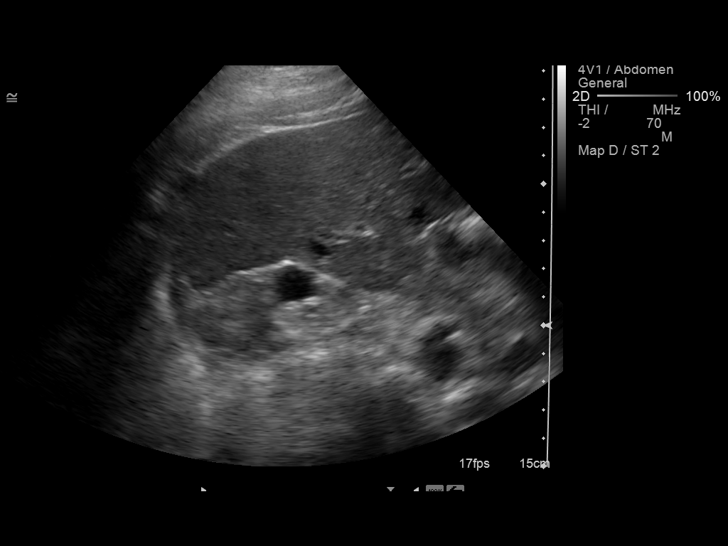
[im 41/61]
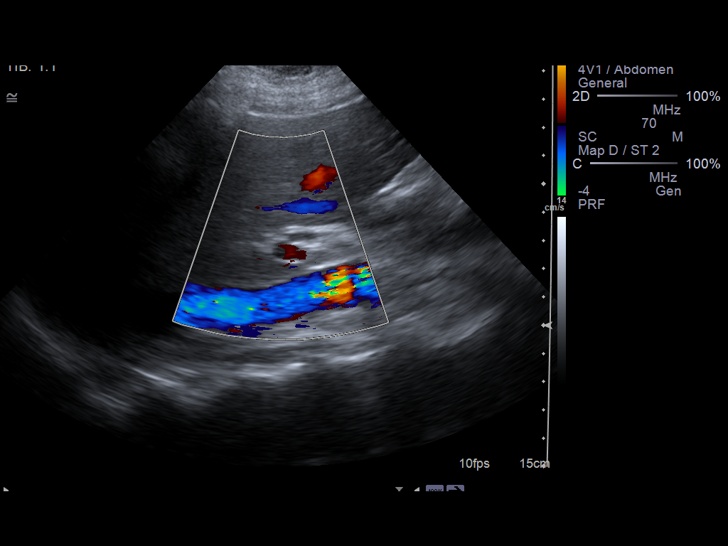
[im 46/61]
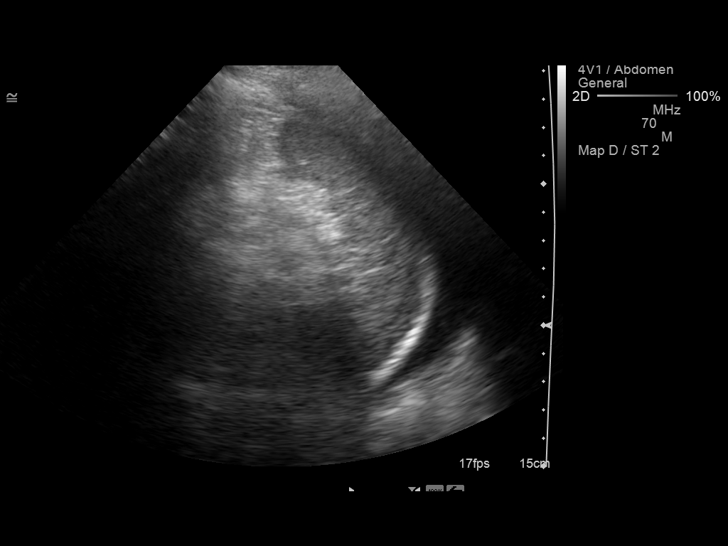
[im 51/61]
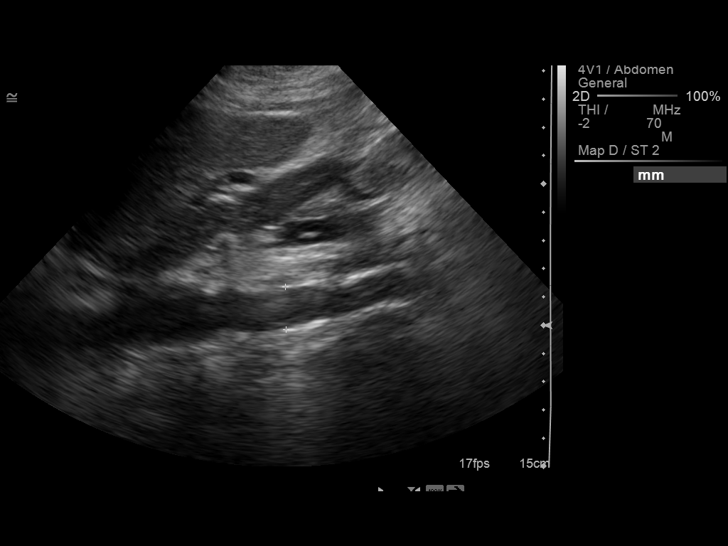
[im 56/61]
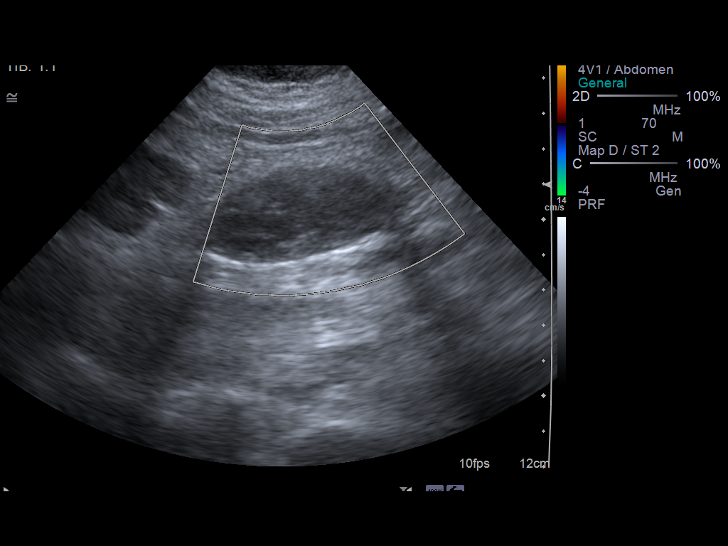
[im 61/61]
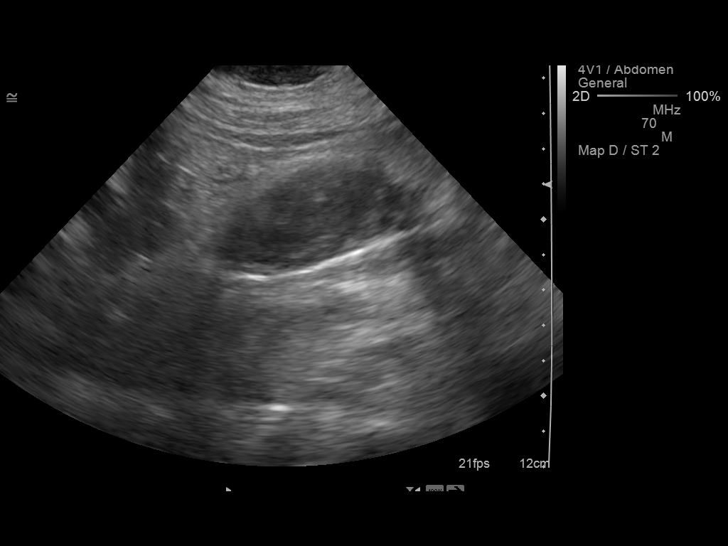

[14 of 25 positions shown; findings below may reference images not displayed]

Gallbladder:  Surgically absent

Common bile duct:  Normal caliber 3 mm diameter

Liver:  Normal echogenicity.  No mass or nodularity.  No
intrahepatic biliary dilatation.

IVC:  Normal appearance

Pancreas:  Not visualized, obscured by bowel gas

Spleen:  Normal appearance, 7.3 cm length

Right kidney:  Atrophic, 6.8 cm length.  Multiple cysts largest 15
x 12 x 12 mm.  Increased cortical echogenicity.

Left kidney:  Not visualized, atrophic right prior CT

Aorta:  Normal caliber

Other:  No free intraperitoneal fluid.  Transplant kidney in left
lower quadrant, small at 6.3 cm length. No definite focal
transplant renal mass.
IMPRESSION: Nonvisualization of pancreas and left kidney.
Atrophic right kidney and transplant kidney with small right renal
cyst.
Post cholecystectomy.

## 2014-08-03 ENCOUNTER — Inpatient Hospital Stay (HOSPITAL_COMMUNITY)
Admission: EM | Admit: 2014-08-03 | Discharge: 2014-08-05 | DRG: 637 | Disposition: A | Discharge status: Home / Self Care | Payer: Medicare Other | Attending: Internal Medicine | Admitting: Internal Medicine

## 2014-08-03 ENCOUNTER — Emergency Department (HOSPITAL_COMMUNITY): Payer: Medicare Other

## 2014-08-03 ENCOUNTER — Encounter (HOSPITAL_COMMUNITY): Payer: Self-pay | Admitting: Emergency Medicine

## 2014-08-03 DIAGNOSIS — J9601 Acute respiratory failure with hypoxia: Secondary | ICD-10-CM | POA: Diagnosis present

## 2014-08-03 DIAGNOSIS — J439 Emphysema, unspecified: Secondary | ICD-10-CM | POA: Diagnosis present

## 2014-08-03 DIAGNOSIS — E0810 Diabetes mellitus due to underlying condition with ketoacidosis without coma: Secondary | ICD-10-CM | POA: Diagnosis not present

## 2014-08-03 DIAGNOSIS — Z6828 Body mass index (BMI) 28.0-28.9, adult: Secondary | ICD-10-CM | POA: Diagnosis not present

## 2014-08-03 DIAGNOSIS — Z79899 Other long term (current) drug therapy: Secondary | ICD-10-CM

## 2014-08-03 DIAGNOSIS — Z888 Allergy status to other drugs, medicaments and biological substances status: Secondary | ICD-10-CM | POA: Diagnosis not present

## 2014-08-03 DIAGNOSIS — G2581 Restless legs syndrome: Secondary | ICD-10-CM | POA: Diagnosis present

## 2014-08-03 DIAGNOSIS — Z885 Allergy status to narcotic agent status: Secondary | ICD-10-CM | POA: Diagnosis not present

## 2014-08-03 DIAGNOSIS — Z992 Dependence on renal dialysis: Secondary | ICD-10-CM

## 2014-08-03 DIAGNOSIS — I6350 Cerebral infarction due to unspecified occlusion or stenosis of unspecified cerebral artery: Secondary | ICD-10-CM | POA: Diagnosis present

## 2014-08-03 DIAGNOSIS — I639 Cerebral infarction, unspecified: Secondary | ICD-10-CM | POA: Diagnosis present

## 2014-08-03 DIAGNOSIS — G43909 Migraine, unspecified, not intractable, without status migrainosus: Secondary | ICD-10-CM | POA: Diagnosis present

## 2014-08-03 DIAGNOSIS — Z8249 Family history of ischemic heart disease and other diseases of the circulatory system: Secondary | ICD-10-CM | POA: Diagnosis not present

## 2014-08-03 DIAGNOSIS — Z880 Allergy status to penicillin: Secondary | ICD-10-CM | POA: Diagnosis not present

## 2014-08-03 DIAGNOSIS — R509 Fever, unspecified: Secondary | ICD-10-CM | POA: Diagnosis present

## 2014-08-03 DIAGNOSIS — R109 Unspecified abdominal pain: Secondary | ICD-10-CM | POA: Diagnosis present

## 2014-08-03 DIAGNOSIS — Z9049 Acquired absence of other specified parts of digestive tract: Secondary | ICD-10-CM | POA: Diagnosis present

## 2014-08-03 DIAGNOSIS — N186 End stage renal disease: Secondary | ICD-10-CM

## 2014-08-03 DIAGNOSIS — I120 Hypertensive chronic kidney disease with stage 5 chronic kidney disease or end stage renal disease: Secondary | ICD-10-CM | POA: Diagnosis present

## 2014-08-03 DIAGNOSIS — E875 Hyperkalemia: Secondary | ICD-10-CM | POA: Diagnosis present

## 2014-08-03 DIAGNOSIS — E669 Obesity, unspecified: Secondary | ICD-10-CM | POA: Diagnosis present

## 2014-08-03 DIAGNOSIS — Z940 Kidney transplant status: Secondary | ICD-10-CM

## 2014-08-03 DIAGNOSIS — G8929 Other chronic pain: Secondary | ICD-10-CM | POA: Diagnosis present

## 2014-08-03 DIAGNOSIS — E1010 Type 1 diabetes mellitus with ketoacidosis without coma: Principal | ICD-10-CM | POA: Diagnosis present

## 2014-08-03 DIAGNOSIS — Z7982 Long term (current) use of aspirin: Secondary | ICD-10-CM

## 2014-08-03 DIAGNOSIS — E1110 Type 2 diabetes mellitus with ketoacidosis without coma: Secondary | ICD-10-CM | POA: Diagnosis present

## 2014-08-03 DIAGNOSIS — F329 Major depressive disorder, single episode, unspecified: Secondary | ICD-10-CM | POA: Diagnosis present

## 2014-08-03 DIAGNOSIS — Z833 Family history of diabetes mellitus: Secondary | ICD-10-CM

## 2014-08-03 DIAGNOSIS — E8770 Fluid overload, unspecified: Secondary | ICD-10-CM | POA: Diagnosis not present

## 2014-08-03 DIAGNOSIS — Z8673 Personal history of transient ischemic attack (TIA), and cerebral infarction without residual deficits: Secondary | ICD-10-CM | POA: Diagnosis not present

## 2014-08-03 DIAGNOSIS — J811 Chronic pulmonary edema: Secondary | ICD-10-CM | POA: Diagnosis present

## 2014-08-03 DIAGNOSIS — Z9483 Pancreas transplant status: Secondary | ICD-10-CM

## 2014-08-03 DIAGNOSIS — N2581 Secondary hyperparathyroidism of renal origin: Secondary | ICD-10-CM | POA: Diagnosis present

## 2014-08-03 DIAGNOSIS — Z794 Long term (current) use of insulin: Secondary | ICD-10-CM

## 2014-08-03 DIAGNOSIS — E785 Hyperlipidemia, unspecified: Secondary | ICD-10-CM | POA: Diagnosis present

## 2014-08-03 DIAGNOSIS — D631 Anemia in chronic kidney disease: Secondary | ICD-10-CM | POA: Diagnosis present

## 2014-08-03 DIAGNOSIS — I10 Essential (primary) hypertension: Secondary | ICD-10-CM | POA: Diagnosis present

## 2014-08-03 DIAGNOSIS — J45909 Unspecified asthma, uncomplicated: Secondary | ICD-10-CM | POA: Diagnosis present

## 2014-08-03 DIAGNOSIS — E611 Iron deficiency: Secondary | ICD-10-CM | POA: Diagnosis present

## 2014-08-03 DIAGNOSIS — E10319 Type 1 diabetes mellitus with unspecified diabetic retinopathy without macular edema: Secondary | ICD-10-CM | POA: Diagnosis present

## 2014-08-03 DIAGNOSIS — R197 Diarrhea, unspecified: Secondary | ICD-10-CM | POA: Diagnosis not present

## 2014-08-03 DIAGNOSIS — K219 Gastro-esophageal reflux disease without esophagitis: Secondary | ICD-10-CM | POA: Diagnosis present

## 2014-08-03 DIAGNOSIS — Z841 Family history of disorders of kidney and ureter: Secondary | ICD-10-CM | POA: Diagnosis not present

## 2014-08-03 LAB — GLUCOSE, CAPILLARY
GLUCOSE-CAPILLARY: 143 mg/dL — AB (ref 70–99)
GLUCOSE-CAPILLARY: 152 mg/dL — AB (ref 70–99)
GLUCOSE-CAPILLARY: 172 mg/dL — AB (ref 70–99)
GLUCOSE-CAPILLARY: 214 mg/dL — AB (ref 70–99)
GLUCOSE-CAPILLARY: 232 mg/dL — AB (ref 70–99)
GLUCOSE-CAPILLARY: 72 mg/dL (ref 70–99)
GLUCOSE-CAPILLARY: 73 mg/dL (ref 70–99)
GLUCOSE-CAPILLARY: 96 mg/dL (ref 70–99)
GLUCOSE-CAPILLARY: 96 mg/dL (ref 70–99)
Glucose-Capillary: 243 mg/dL — ABNORMAL HIGH (ref 70–99)
Glucose-Capillary: 130 mg/dL — ABNORMAL HIGH (ref 70–99)
Glucose-Capillary: 72 mg/dL (ref 70–99)

## 2014-08-03 LAB — I-STAT ARTERIAL BLOOD GAS, ED
Acid-base deficit: 15 mmol/L — ABNORMAL HIGH (ref 0.0–2.0)
BICARBONATE: 13.9 meq/L — AB (ref 20.0–24.0)
O2 Saturation: 79 %
PO2 ART: 57 mmHg — AB (ref 80.0–100.0)
Patient temperature: 98.6
TCO2: 15 mmol/L (ref 0–100)
pCO2 arterial: 41.8 mmHg (ref 35.0–45.0)
pH, Arterial: 7.13 — CL (ref 7.350–7.450)

## 2014-08-03 LAB — CBC
HCT: 29.2 % — ABNORMAL LOW (ref 36.0–46.0)
HEMATOCRIT: 35.6 % — AB (ref 36.0–46.0)
Hemoglobin: 10.5 g/dL — ABNORMAL LOW (ref 12.0–15.0)
Hemoglobin: 9.5 g/dL — ABNORMAL LOW (ref 12.0–15.0)
MCH: 31.6 pg (ref 26.0–34.0)
MCH: 31.8 pg (ref 26.0–34.0)
MCHC: 29.5 g/dL — AB (ref 30.0–36.0)
MCHC: 32.5 g/dL (ref 30.0–36.0)
MCV: 107.2 fL — ABNORMAL HIGH (ref 78.0–100.0)
MCV: 97.7 fL (ref 78.0–100.0)
Platelets: 219 10*3/uL (ref 150–400)
Platelets: 151 10*3/uL (ref 150–400)
RBC: 3.32 MIL/uL — AB (ref 3.87–5.11)
RBC: 2.99 MIL/uL — AB (ref 3.87–5.11)
RDW: 14.9 % (ref 11.5–15.5)
RDW: 14.1 % (ref 11.5–15.5)
WBC: 10.1 10*3/uL (ref 4.0–10.5)
WBC: 10.7 10*3/uL — AB (ref 4.0–10.5)

## 2014-08-03 LAB — COMPREHENSIVE METABOLIC PANEL
ALBUMIN: 3.6 g/dL (ref 3.5–5.2)
ALT: 19 U/L (ref 0–35)
AST: 34 U/L (ref 0–37)
Alkaline Phosphatase: 178 U/L — ABNORMAL HIGH (ref 39–117)
Anion gap: 19 — ABNORMAL HIGH (ref 5–15)
BUN: 69 mg/dL — AB (ref 6–23)
CALCIUM: 8.2 mg/dL — AB (ref 8.4–10.5)
CO2: 17 mmol/L — ABNORMAL LOW (ref 19–32)
CREATININE: 11.78 mg/dL — AB (ref 0.50–1.10)
Chloride: 84 mmol/L — ABNORMAL LOW (ref 96–112)
GFR calc Af Amer: 4 mL/min — ABNORMAL LOW (ref >90)
GFR calc non Af Amer: 3 mL/min — ABNORMAL LOW (ref >90)
Glucose, Bld: 1211 mg/dL (ref 70–99)
Potassium: >7.5 mmol/L (ref 3.5–5.1)
Sodium: 120 mmol/L — ABNORMAL LOW (ref 135–145)
TOTAL PROTEIN: 7.1 g/dL (ref 6.0–8.3)
Total Bilirubin: 1 mg/dL (ref 0.3–1.2)

## 2014-08-03 LAB — I-STAT CHEM 8, ED
BUN: 38 mg/dL — AB (ref 6–23)
CHLORIDE: 96 mmol/L (ref 96–112)
Calcium, Ion: 1.18 mmol/L (ref 1.12–1.23)
Creatinine, Ser: 6.3 mg/dL — ABNORMAL HIGH (ref 0.50–1.10)
Glucose, Bld: >700 mg/dL (ref 70–99)
HEMATOCRIT: 43 % (ref 36.0–46.0)
Hemoglobin: 14.6 g/dL (ref 12.0–15.0)
POTASSIUM: 4.3 mmol/L (ref 3.5–5.1)
SODIUM: 134 mmol/L — AB (ref 135–145)
TCO2: 19 mmol/L (ref 0–100)

## 2014-08-03 LAB — INFLUENZA PANEL BY PCR (TYPE A & B)
H1N1 flu by pcr: NOT DETECTED
INFLAPCR: NEGATIVE
Influenza B By PCR: NEGATIVE

## 2014-08-03 LAB — I-STAT TROPONIN, ED: Troponin i, poc: 0 ng/mL (ref 0.00–0.08)

## 2014-08-03 LAB — CREATININE, SERUM
Creatinine, Ser: 5.2 mg/dL — ABNORMAL HIGH (ref 0.50–1.10)
GFR calc non Af Amer: 9 mL/min — ABNORMAL LOW (ref >90)
GFR, EST AFRICAN AMERICAN: 11 mL/min — AB (ref >90)

## 2014-08-03 LAB — CBG MONITORING, ED

## 2014-08-03 LAB — MRSA PCR SCREENING: MRSA by PCR: NEGATIVE

## 2014-08-03 MED ORDER — METOCLOPRAMIDE HCL 5 MG PO TABS
5.0000 mg | ORAL_TABLET | Freq: Three times a day (TID) | ORAL | Status: DC
Start: 2014-08-03 — End: 2014-08-05
  Administered 2014-08-03 – 2014-08-05 (×7): 5 mg via ORAL
  Filled 2014-08-03 (×11): qty 1

## 2014-08-03 MED ORDER — HEPARIN SODIUM (PORCINE) 5000 UNIT/ML IJ SOLN
5000.0000 [IU] | Freq: Three times a day (TID) | INTRAMUSCULAR | Status: DC
  Filled 2014-08-03 (×8): qty 1

## 2014-08-03 MED ORDER — SODIUM CHLORIDE 0.9 % IJ SOLN
3.0000 mL | Freq: Two times a day (BID) | INTRAMUSCULAR | Status: DC
  Administered 2014-08-03 – 2014-08-05 (×5): 3 mL via INTRAVENOUS

## 2014-08-03 MED ORDER — RENA-VITE PO TABS
1.0000 | ORAL_TABLET | Freq: Every day | ORAL | Status: DC
  Administered 2014-08-03 – 2014-08-04 (×2): 1 via ORAL
  Filled 2014-08-03 (×3): qty 1

## 2014-08-03 MED ORDER — METHYLPREDNISOLONE SODIUM SUCC 125 MG IJ SOLR
125.0000 mg | Freq: Once | INTRAMUSCULAR | Status: AC
  Administered 2014-08-03: 125 mg via INTRAMUSCULAR

## 2014-08-03 MED ORDER — ALTEPLASE 2 MG IJ SOLR
2.0000 mg | Freq: Once | INTRAMUSCULAR | Status: DC | PRN
  Filled 2014-08-03: qty 2

## 2014-08-03 MED ORDER — SODIUM CHLORIDE 0.9 % IV SOLN: 100.0000 mL | INTRAVENOUS | Status: DC | PRN

## 2014-08-03 MED ORDER — METHYLPREDNISOLONE SODIUM SUCC 125 MG IJ SOLR
125.0000 mg | Freq: Once | INTRAMUSCULAR | Status: DC
  Filled 2014-08-03: qty 2

## 2014-08-03 MED ORDER — PANTOPRAZOLE SODIUM 40 MG PO TBEC
40.0000 mg | DELAYED_RELEASE_TABLET | Freq: Every day | ORAL | Status: DC
  Administered 2014-08-03 – 2014-08-04 (×2): 40 mg via ORAL
  Filled 2014-08-03 (×2): qty 1

## 2014-08-03 MED ORDER — MONTELUKAST SODIUM 10 MG PO TABS
10.0000 mg | ORAL_TABLET | Freq: Every day | ORAL | Status: DC
  Administered 2014-08-03 – 2014-08-04 (×2): 10 mg via ORAL
  Filled 2014-08-03 (×3): qty 1

## 2014-08-03 MED ORDER — BENZONATATE 100 MG PO CAPS
200.0000 mg | ORAL_CAPSULE | Freq: Three times a day (TID) | ORAL | Status: DC
  Administered 2014-08-03 – 2014-08-05 (×6): 200 mg via ORAL
  Filled 2014-08-03 (×8): qty 2

## 2014-08-03 MED ORDER — DOXERCALCIFEROL 4 MCG/2ML IV SOLN
2.0000 ug | INTRAVENOUS | Status: DC
  Administered 2014-08-03 – 2014-08-05 (×2): 2 ug via INTRAVENOUS
  Filled 2014-08-03: qty 2

## 2014-08-03 MED ORDER — HEPARIN SODIUM (PORCINE) 1000 UNIT/ML DIALYSIS: 20.0000 [IU]/kg | INTRAMUSCULAR | Status: DC | PRN

## 2014-08-03 MED ORDER — SODIUM CHLORIDE 0.9 % IV SOLN
INTRAVENOUS | Status: DC
  Administered 2014-08-03: 5.4 [IU]/h via INTRAVENOUS
  Filled 2014-08-03: qty 2.5

## 2014-08-03 MED ORDER — INSULIN REGULAR BOLUS VIA INFUSION
0.0000 [IU] | Freq: Three times a day (TID) | INTRAVENOUS | Status: DC
  Filled 2014-08-03: qty 10

## 2014-08-03 MED ORDER — PENTAFLUOROPROP-TETRAFLUOROETH EX AERO: 1.0000 "application " | INHALATION_SPRAY | CUTANEOUS | Status: DC | PRN

## 2014-08-03 MED ORDER — SODIUM CHLORIDE 0.9 % IV SOLN: INTRAVENOUS | Status: DC

## 2014-08-03 MED ORDER — VANCOMYCIN HCL 10 G IV SOLR
1250.0000 mg | Freq: Once | INTRAVENOUS | Status: DC
  Filled 2014-08-03: qty 1250

## 2014-08-03 MED ORDER — ALBUTEROL SULFATE (2.5 MG/3ML) 0.083% IN NEBU
5.0000 mg | INHALATION_SOLUTION | Freq: Once | RESPIRATORY_TRACT | Status: AC
  Administered 2014-08-03: 5 mg via RESPIRATORY_TRACT
  Filled 2014-08-03: qty 6

## 2014-08-03 MED ORDER — LIDOCAINE 5 % EX PTCH
1.0000 | MEDICATED_PATCH | Freq: Every day | CUTANEOUS | Status: DC
  Administered 2014-08-03 – 2014-08-05 (×3): 1 via TRANSDERMAL
  Filled 2014-08-03 (×3): qty 1

## 2014-08-03 MED ORDER — LIDOCAINE HCL (PF) 1 % IJ SOLN: 5.0000 mL | INTRAMUSCULAR | Status: DC | PRN

## 2014-08-03 MED ORDER — HYDROCODONE-HOMATROPINE 5-1.5 MG/5ML PO SYRP: 5.0000 mL | ORAL_SOLUTION | ORAL | Status: DC | PRN

## 2014-08-03 MED ORDER — DEXTROSE 50 % IV SOLN
25.0000 mL | INTRAVENOUS | Status: DC | PRN
  Administered 2014-08-04: 25 mL via INTRAVENOUS
  Administered 2014-08-04: 20 mL via INTRAVENOUS
  Filled 2014-08-03: qty 50

## 2014-08-03 MED ORDER — SIMVASTATIN 20 MG PO TABS
20.0000 mg | ORAL_TABLET | Freq: Every day | ORAL | Status: DC
  Administered 2014-08-03 – 2014-08-04 (×2): 20 mg via ORAL
  Filled 2014-08-03 (×3): qty 1

## 2014-08-03 MED ORDER — NEPRO/CARBSTEADY PO LIQD
237.0000 mL | ORAL | Status: DC | PRN
  Filled 2014-08-03: qty 237

## 2014-08-03 MED ORDER — DOXERCALCIFEROL 4 MCG/2ML IV SOLN
INTRAVENOUS | Status: AC
  Administered 2014-08-03: 2 ug via INTRAVENOUS
  Filled 2014-08-03: qty 2

## 2014-08-03 MED ORDER — LIDOCAINE-PRILOCAINE 2.5-2.5 % EX CREA
1.0000 "application " | TOPICAL_CREAM | CUTANEOUS | Status: DC | PRN
  Filled 2014-08-03: qty 5

## 2014-08-03 MED ORDER — CALCIUM ACETATE 667 MG PO CAPS
2001.0000 mg | ORAL_CAPSULE | Freq: Three times a day (TID) | ORAL | Status: DC
Start: 2014-08-03 — End: 2014-08-05
  Administered 2014-08-04 – 2014-08-05 (×4): 2001 mg via ORAL
  Filled 2014-08-03 (×8): qty 3

## 2014-08-03 MED ORDER — GABAPENTIN 100 MG PO CAPS
100.0000 mg | ORAL_CAPSULE | Freq: Two times a day (BID) | ORAL | Status: DC
Start: 2014-08-03 — End: 2014-08-05
  Administered 2014-08-03 – 2014-08-05 (×5): 100 mg via ORAL
  Filled 2014-08-03 (×6): qty 1

## 2014-08-03 MED ORDER — CETYLPYRIDINIUM CHLORIDE 0.05 % MT LIQD
7.0000 mL | Freq: Two times a day (BID) | OROMUCOSAL | Status: DC
  Administered 2014-08-03: 7 mL via OROMUCOSAL

## 2014-08-03 MED ORDER — IPRATROPIUM-ALBUTEROL 0.5-2.5 (3) MG/3ML IN SOLN
3.0000 mL | RESPIRATORY_TRACT | Status: DC
  Administered 2014-08-03 – 2014-08-04 (×4): 3 mL via RESPIRATORY_TRACT
  Filled 2014-08-03 (×3): qty 3

## 2014-08-03 MED ORDER — INSULIN REGULAR HUMAN 100 UNIT/ML IJ SOLN
INTRAMUSCULAR | Status: DC
  Administered 2014-08-03: 5.5 [IU]/h via INTRAVENOUS
  Filled 2014-08-03: qty 2.5

## 2014-08-03 MED ORDER — CLONAZEPAM 0.5 MG PO TABS
0.5000 mg | ORAL_TABLET | Freq: Two times a day (BID) | ORAL | Status: DC
  Administered 2014-08-03 – 2014-08-04 (×3): 0.5 mg via ORAL
  Filled 2014-08-03 (×3): qty 1

## 2014-08-03 MED ORDER — DEXTROSE-NACL 5-0.45 % IV SOLN: INTRAVENOUS | Status: DC

## 2014-08-03 MED ORDER — ALBUTEROL (5 MG/ML) CONTINUOUS INHALATION SOLN
10.0000 mg/h | INHALATION_SOLUTION | Freq: Once | RESPIRATORY_TRACT | Status: AC
  Administered 2014-08-03: 10 mg/h via RESPIRATORY_TRACT
  Filled 2014-08-03: qty 20

## 2014-08-03 MED ORDER — VANCOMYCIN HCL IN DEXTROSE 750-5 MG/150ML-% IV SOLN: 750.0000 mg | INTRAVENOUS | Status: DC

## 2014-08-03 MED ORDER — DARBEPOETIN ALFA 40 MCG/0.4ML IJ SOSY
PREFILLED_SYRINGE | INTRAMUSCULAR | Status: AC
  Administered 2014-08-03: 40 ug
  Filled 2014-08-03: qty 0.4

## 2014-08-03 MED ORDER — HEPARIN SODIUM (PORCINE) 1000 UNIT/ML DIALYSIS: 1000.0000 [IU] | INTRAMUSCULAR | Status: DC | PRN

## 2014-08-03 MED ORDER — ONDANSETRON HCL 4 MG/2ML IJ SOLN
4.0000 mg | Freq: Once | INTRAMUSCULAR | Status: AC
  Administered 2014-08-03: 4 mg via INTRAVENOUS
  Filled 2014-08-03: qty 2

## 2014-08-03 MED ORDER — SODIUM CHLORIDE 0.9 % IV SOLN
1.0000 g | Freq: Once | INTRAVENOUS | Status: DC
  Filled 2014-08-03: qty 10

## 2014-08-03 MED ORDER — ZOLPIDEM TARTRATE 5 MG PO TABS
5.0000 mg | ORAL_TABLET | Freq: Every day | ORAL | Status: DC
  Administered 2014-08-03: 5 mg via ORAL
  Filled 2014-08-03: qty 1

## 2014-08-03 MED ORDER — DEXTROSE 5 % IV SOLN: 2.0000 g | INTRAVENOUS | Status: DC

## 2014-08-03 MED ORDER — DARBEPOETIN ALFA 40 MCG/0.4ML IJ SOSY
40.0000 ug | PREFILLED_SYRINGE | INTRAMUSCULAR | Status: DC
  Administered 2014-08-05: 40 ug via INTRAVENOUS
  Filled 2014-08-03: qty 0.4

## 2014-08-03 MED ORDER — ASPIRIN 325 MG PO TABS
325.0000 mg | ORAL_TABLET | Freq: Every day | ORAL | Status: DC
  Administered 2014-08-03 – 2014-08-05 (×3): 325 mg via ORAL
  Filled 2014-08-03 (×3): qty 1

## 2014-08-03 MED ORDER — METOPROLOL SUCCINATE ER 100 MG PO TB24
100.0000 mg | ORAL_TABLET | Freq: Every day | ORAL | Status: DC
  Administered 2014-08-04 – 2014-08-05 (×2): 100 mg via ORAL
  Filled 2014-08-03 (×2): qty 1

## 2014-08-03 MED ORDER — ALBUTEROL SULFATE (2.5 MG/3ML) 0.083% IN NEBU: 2.5000 mg | INHALATION_SOLUTION | RESPIRATORY_TRACT | Status: DC | PRN

## 2014-08-03 MED ORDER — DEXTROSE-NACL 5-0.45 % IV SOLN
INTRAVENOUS | Status: DC
  Administered 2014-08-03: 13:00:00 via INTRAVENOUS

## 2014-08-03 NOTE — H&P (Signed)
[facility] History and Physical                                                                                    [name], is a 44 y.o. female  MRN: [id number]   DOB - 01-Sep-1970  Admit Date - 08/03/2014  Outpatient Primary MD for the patient is [name], Gifford Shave, MD  With History of -  Past Medical History  Diagnosis Date  . Hyperlipidemia   . Alopecia   . Obesity   . Iron deficiency   . ESRD (end stage renal disease)     S/p pancreatic and kidney transplant, but back on HD 2008, dialysis at [facility], [place], Sat  . RLS (restless legs syndrome)   . Injury of conjunctiva and corneal abrasion of right eye without foreign body   . Cellulitis   . Anemia   . Blood transfusion 2004    "when I had my transplant"  . Migraine   . Hyperparathyroidism, secondary   . Diabetic retinopathy   . Hypertension     takes Metoprolol and Exforge daily  . Depression     takes Klonopin nightly  . Diabetes mellitus type 1     Pt states diagnosed at age 87 with prior episodes of DKA. S/p pancreatic transplant   . GERD (gastroesophageal reflux disease)     takes Protonix daily  . Bronchitis   . Migraine   . Asthma   . Emphysema of lung   . Angina     none in past 2 years.  . Stomach pain     Hx: of chronic  . Pneumonia 2012    "double"  . Dialysis patient     tues,thurs,sat  . 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  Surgery was done at [facility].         Past Surgical History  Procedure Laterality Date  . 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 Thyroglobulin      x 7  . Av fistula placement      right upper arm  . Cholecystectomy  1995  .  hd graft placement/removal      "had 2 in my left upper arm"  . Eye  surgery    . Retinopathy surgery    . Tooth extraction  10/10/11    X's two  . Insertion of dialysis catheter  12/08/2011    Procedure: INSERTION OF DIALYSIS CATHETER;  Surgeon: Angelia Mould, MD;  Location: [place];  Service: Vascular;  Laterality: Left;  . Av fistula placement  01/22/2012    Procedure: INSERTION OF ARTERIOVENOUS (AV) GORE-TEX GRAFT ARM;  Surgeon: Angelia Mould, MD;  Location: [place];  Service: Vascular;  Laterality: Left;  Ultrasound guided.  . Av fistula placement  03/14/2012    Procedure: INSERTION OF ARTERIOVENOUS (AV) GORE-TEX GRAFT THIGH;  Surgeon: Angelia Mould, MD;  Location: [place];  Service: Vascular;  Laterality: Right;  . False aneurysm repair Right 01/02/2013    Procedure: Excision of lymphocele in right thigh.;  Surgeon: Angelia Mould, MD;  Location: [place];  Service: Vascular;  Laterality: Right;  . Carpal tunnel release Right 03/02/2013    Procedure: CARPAL TUNNEL RELEASE;  Surgeon: Tennis Must, MD;  Location: [place];  Service: Orthopedics;  Laterality: Right;  . Flexible sigmoidoscopy N/A 03/24/2013    Procedure: FLEXIBLE SIGMOIDOSCOPY;  Surgeon: Cleotis Nipper, MD;  Location: [facility] ENDOSCOPY;  Service: Endoscopy;  Laterality: N/A;  . Revision of arteriovenous goretex graft Right 04/02/2013    Procedure: REPAIR OF PSEUDOANEURYSM OF ARTERIOVENOUS GORETEX GRAFT;  Surgeon: Angelia Mould, MD;  Location: [place];  Service: Vascular;  Laterality: Right;  . Carpal tunnel release Left 10/16/2013    Procedure: LEFT CARPAL TUNNEL RELEASE;  Surgeon: Tennis Must, MD;  Location: [place];  Service: Orthopedics;  Laterality: Left;  . Unilateral upper extremeity angiogram N/A 11/12/2011    Procedure: UNILATERAL UPPER Anselmo Rod;  Surgeon: Angelia Mould, MD;  Location: [facility] CATH LAB;  Service: Cardiovascular;  Laterality: N/A;  . Fistulogram Left 03/03/2012    Procedure: FISTULOGRAM;  Surgeon:  Angelia Mould, MD;  Location: [facility] CATH LAB;  Service: Cardiovascular;  Laterality: Left;    in for   Chief Complaint  Patient presents with  . Shortness of Breath     HPI [name]  is a 44 y.o. 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 She presented to the ER with complaints of shortness of breath associated with cough fever and vomiting. She reported that she began having upper respiratory symptoms about 1 week prior with productive cough of yellow sputum. She reports fever up to 101 in the past 24 hours. She reports poor intake for 2 days. She had nausea and vomiting without diarrhea. She reports exposure to sick contacts. She denies any issues related to not taking her insulin or attending dialysis sessions. When EMS arrived to evaluate the patient her O2 sats rations 82% on room air the 91% on 4 L nasal cannula. She was placed on high percent nonrebreather mask.  In the ER she was afebrile, her BP was initially elevated at 181/68 but after stabilized blood pressure came down to 135/72. Her respiratory rate was 24. She was in sinus tachycardia. Her O2 sats ration's were 90% on 4 L. An arterial blood gas was obtained that showed a pH of 7.13, CO2 41.8 and PO2 of 57. Her sodium was 120, her potassium is greater than 7.5, CO2 was 17 and her anion gap was 19, BUN C9 and creatinine 11.78, serum glucose was 1211. Troponin was 0.00. He had no leukocytosis, hemoglobin was 10.5 with MCV of 107.2 and her platelets were normal. Chest x-ray revealed cardiomegaly, vascular congestion, pulmonary edema  consistent with congestive heart failure and increased hazy opacities at the lung bases concerning for pleural effusions.  Review of Systems   In addition to the HPI above,  No Headache, changes with Vision or hearing, new weakness, tingling, numbness in any extremity, No problems swallowing food or Liquids, indigestion/reflux No  Chest pain or palpitations No Abdominal pain, no melena or hematochezia, no dark tarry stools, Bowel movements are regular, No dysuria, hematuria or flank pain No new skin rashes, lesions, masses or bruises, No new joints pains-aches No recent weight gain or loss No polyuria, polydypsia or polyphagia,  *A full 10 point Review of Systems was done, except as stated above, all other Review of Systems were negative.  Social History History  Substance Use Topics  . Smoking status: Never Smoker   . Smokeless tobacco: Never Used  . Alcohol Use: No    Family History Family History  Problem Relation Age of Onset  . Hypertension Mother   . Kidney disease Mother   . Diabetes Mother   . Hypertension Father   . Kidney disease Father   . Diabetes Father     Prior to Admission medications   Medication Sig Start Date End Date Taking? Authorizing Provider  albuterol (PROVENTIL HFA;VENTOLIN HFA) 108 (90 BASE) MCG/ACT inhaler Inhale 2 puffs into the lungs every 4 (four) hours as needed for wheezing or shortness of breath (as per home regimen). 02/20/13  Yes Kinnie Feil, MD  aspirin 325 MG tablet Take 1 tablet (325 mg total) by mouth daily. 05/20/14  Yes Velvet Bathe, MD  calcium acetate (PHOSLO) 667 MG capsule Take 3 capsules (2,001 mg total) by mouth 3 (three) times daily with meals. 05/20/14  Yes Velvet Bathe, MD  clonazePAM (KLONOPIN) 0.5 MG tablet Take 0.5 mg by mouth 2 (two) times daily.    Yes Historical Provider, MD  gabapentin (NEURONTIN) 100 MG capsule Take 100 mg by mouth 2 (two) times daily.    Yes Historical Provider, MD  insulin aspart (NOVOLOG) 100  UNIT/ML injection Inject 0-9 Units into the skin 3 (three) times daily with meals. Patient taking differently: Inject 0-9 Units into the skin 2 (two) times daily with a meal.  04/20/14  Yes Domenic Polite, MD  insulin glargine (LANTUS) 100 UNIT/ML injection Inject 0.05 mLs (5 Units total) into the skin 2 (two) times daily. Patient taking differently: Inject 6 Units into the skin 2 (two) times daily.  05/20/14  Yes Velvet Bathe, MD  lidocaine (LIDODERM) 5 % Place 1 patch onto the skin daily. Remove & Discard patch within 12 hours or as directed by MD 05/04/14  Yes Liam Graham, PA-C  metoCLOPramide (REGLAN) 5 MG tablet Take 1 tablet (5 mg total) by mouth 4 (four) times daily -  before meals and at bedtime. 12/01/13  Yes Janece Canterbury, MD  metoprolol succinate (TOPROL-XL) 100 MG 24 hr tablet Take 1 tablet (100 mg total) by mouth daily. Take with or immediately following a meal. 05/20/14  Yes Velvet Bathe, MD  montelukast (SINGULAIR) 10 MG tablet Take 10 mg by mouth at bedtime.   Yes Historical Provider, MD  multivitamin (RENA-VIT) TABS tablet Take 1 tablet by mouth daily.   Yes Historical Provider, MD  Nutritional Supplements (FEEDING SUPPLEMENT, NEPRO CARB STEADY,) LIQD Take 237 mLs by mouth 2 (two) times daily between meals. 04/23/14  Yes Orson Eva, MD  pantoprazole (PROTONIX) 40 MG tablet Take 1 tablet (40 mg total) by mouth at bedtime. 03/27/13  Yes Hosie Poisson, MD  promethazine (PHENERGAN) 25 MG tablet Take 25 mg by mouth every 6 (six) hours as needed for nausea or vomiting.   Yes Historical Provider, MD  simvastatin (ZOCOR) 20 MG tablet Take 1 tablet (20 mg total) by mouth at bedtime. 05/20/14  Yes Velvet Bathe, MD  zolpidem Lorrin Mais)  5 MG tablet Take 5 mg by mouth at bedtime.   Yes Historical Provider, MD  oxyCODONE-acetaminophen (PERCOCET/ROXICET) 5-325 MG per tablet Take 1 tablet by mouth every 6 (six) hours as needed for moderate pain or severe pain. Patient not taking: Reported on  05/18/2014 05/04/14   Liam Graham, PA-C    Allergies  Allergen Reactions  . Depakote [Divalproex Sodium] Other (See Comments)    Pt gets "delirious"  . Morphine And Related Nausea And Vomiting and Other (See Comments)    "makes me delirious"  . Penicillins Anaphylaxis    Tolerated Zosyn Dec 2014  . Tramadol Nausea And Vomiting  . Imitrex [Sumatriptan] Other (See Comments)    seizures  . Vicodin [Hydrocodone-Acetaminophen] Itching and Rash    Physical Exam  Vitals  Blood pressure 135/72, pulse 109, temperature 97.7 F (36.5 C), resp. rate 16, height 5\' 1"  (1.549 m), weight 145 lb (65.772 kg), last menstrual period 08/01/2014, SpO2 100 %.   General:  In mild acute distress as evidenced by tachypnea,   Psych:  Normal affect, Denies Suicidal or Homicidal ideations, Awake Alert, Oriented X 3. Speech and thought patterns are clear and appropriate, no apparent short term memory deficits  Neuro:   No focal neurological deficits, CN II through XII intact, Strength 5/5 all 4 extremities, Sensation intact all 4 extremities.  ENT:  Ears and Eyes appear Normal, Conjunctivae clear, PER. Moist oral mucosa without erythema or exudates.  Neck:  Supple, No lymphadenopathy appreciated  Respiratory:  Symmetrical chest wall movement, Good air movement bilaterally, coarse bilateral breath sounds with scattered crackles in the upper airways with rhonchi and crackles in the bases, currently on percent nonrebreather mask on dialysis although had been on 4 L oxygen in the ER, does become somewhat short of breath with speaking and this also elucidates some mild paroxysmal coughing effort  Cardiac:  RRR, No Murmurs, no LE edema noted, no JVD, No carotid bruits, peripheral pulses palpable at 2+  Abdomen:  Positive bowel sounds, Soft, Non tender, Non distended,  No masses appreciated, no obvious hepatosplenomegaly  Skin:  No Cyanosis, Normal Skin Turgor, No Skin Rash or Bruise.  Extremities:  Symmetrical without obvious trauma or injury,  no effusions.  Data Review  CBC  Recent Labs Lab 08/03/14 0448  WBC 10.1  HGB 10.5*  HCT 35.6*  PLT 219  MCV 107.2*  MCH 31.6  MCHC 29.5*  RDW 14.9    Chemistries   Recent Labs Lab 08/03/14 0448  NA 120*  K >7.5*  CL 84*  CO2 17*  GLUCOSE 1211*  BUN 69*  CREATININE 11.78*  CALCIUM 8.2*  AST 34  ALT 19  ALKPHOS 178*  BILITOT 1.0    estimated creatinine clearance is 5.5 mL/min (by C-G formula based on Cr of 11.78).  No results for input(s): TSH, T4TOTAL, T3FREE, THYROIDAB in the last 72 hours.  Invalid input(s): FREET3  Coagulation profile No results for input(s): INR, PROTIME in the last 168 hours.  No results for input(s): DDIMER in the last 72 hours.  Cardiac Enzymes No results for input(s): CKMB, TROPONINI, MYOGLOBIN in the last 168 hours.  Invalid input(s): CK  Invalid input(s): POCBNP  Urinalysis    Component Value Date/Time   COLORURINE YELLOW 09/07/2010 1517   APPEARANCEUR TURBID* 09/07/2010 1517   LABSPEC 1.016 09/07/2010 1517   PHURINE 7.0 09/07/2010 1517   GLUCOSEU >1000* 09/07/2010 1517   HGBUR LARGE* 09/07/2010 1517   BILIRUBINUR NEGATIVE 09/07/2010 1517   KETONESUR  NEGATIVE 09/07/2010 1517   PROTEINUR >300* 09/07/2010 1517   UROBILINOGEN 0.2 09/07/2010 1517   NITRITE NEGATIVE 09/07/2010 1517   LEUKOCYTESUR LARGE* 09/07/2010 1517    Imaging results:   Dg Chest Port 1 View  08/03/2014   CLINICAL DATA:  Shortness of breath, fever, vomiting for 1 week.  EXAM: PORTABLE CHEST - 1 VIEW  COMPARISON:  05/17/2014  FINDINGS: Cardiomegaly is stable. There is vascular congestion and pulmonary edema, this appears similar to prior exam. Suspect small bilateral pleural effusions with hazy opacity at the lung bases. No pneumothorax. No acute osseous abnormalities.  IMPRESSION: 1. Cardiomegaly, vascular congestion, pulmonary edema, consistent with congestive heart failure. This appears similar to prior  exam. 2. Increased hazy opacity at the lung bases concerning for pleural effusions.   Electronically Signed   By: Jeb Levering M.D.   On: 08/03/2014 04:10     EKG: Sinus tachycardia with peaked T waves, QTC 494 ms   Assessment & Plan  Active Problems:   DKA  -Admit to stepdown -Patient denies noncompliance with insulin; suspect underlying pulmonary infection as trigger -Continue glucose stabilizer and transition into this with sliding scale insulin once anion gap closed -Chest x-ray appeared consistent with edema so at this juncture will not pursue volume resuscitation    Acute respiratory failure with hypoxia/Fever/volume overload -Patient has a mixed respiratory picture: Chest x-ray concerning for edema which hopefully will improve her respiratory symptoms after dialysis; she also has fever concerning for influenza-symptom management to be consistent with viral bronchitis -Check pan influenza panel as well as a respiratory viral panel  -No leukocytosis that as precaution we'll provide broad-spectrum antibiotics to cover for potential HCAP although no evidence of this on chest x-ray -We'll repeat chest x-ray after dialysis to see if any focal infiltrates present -Patient clearly hypoxemic based on ABG and at lower PO2 readings than previous arterial gases so monitor closely -Continue supportive care with oxygen and wean as tolerated -Provide DuoNeb, Tessalon Perles and Hycodan for cough    Hyperkalemia/End-stage renal disease on hemodialysis -Per nephrology -In dialysis now    Hypertension -Continue Toprol    Hyperlipidemia -Continue home medications    RLS (restless legs syndrome) -Continue home medications    History of simultaneous kidney and pancreas transplant -Not currently on chronic immunosuppressive therapy    Recent Left pontine CVA -Continue full dose aspirin    DVT Prophylaxis: Subcutaneous heparin  Family Communication:   No family at bedside  Code  Status:  Full code  Condition:  Stable  Time spent in minutes : 60   ELLIS,ALLISON L. ANP on 08/03/2014 at 7:50 AM  Between 7am to 7pm - Pager - [phone]  After 7pm go to [email or web address] - password [email or web address]  And look for the night coverage person covering me after hours  [facility]

## 2014-08-03 NOTE — ED Notes (Signed)
Per Katharine Look, Dialysis RN, pt completed dialysis, was supposed to get 2-3 liters pulled of, only 1.5 liters were removed due to cramping. Pt potassium initially 7.5, now 4.3. Pt states shes on NRB, denies SOB while on it. AAOX4, in no pain.

## 2014-08-03 NOTE — Progress Notes (Signed)
Inpatient Diabetes Program Recommendations  AACE/ADA: New Consensus Statement on Inpatient Glycemic Control (2013)  Target Ranges:  Prepandial:   less than 140 mg/dL      Peak postprandial:   less than 180 mg/dL (1-2 hours)      Critically ill patients:  140 - 180 mg/dL   Diabetes history: DM 1 Outpatient Diabetes medications: Lantus 6 units BID, Novolog 0-9 units BID with a meal Current orders for Inpatient glycemic control: Insulin gtt  Inpatient Diabetes Program Recommendations Insulin - Basal: When patient is transitioned off insulin gtt. Please consider starting basal insulin at home dose (Lantus 6 units BID).   Thanks,  Tama Headings RN, MSN, [facility] Inpatient Diabetes Coordinator Team Pager [phone]'

## 2014-08-03 NOTE — Progress Notes (Signed)
ANTIBIOTIC CONSULT NOTE - INITIAL  Pharmacy Consult for vancomycin and cefepime Indication: rule out pneumonia  Allergies  Allergen Reactions  . Depakote [Divalproex Sodium] Other (See Comments)    Pt gets "delirious"  . Morphine And Related Nausea And Vomiting and Other (See Comments)    "makes me delirious"  . Penicillins Anaphylaxis    Tolerated Zosyn Dec 2014  . Tramadol Nausea And Vomiting  . Imitrex [Sumatriptan] Other (See Comments)    seizures  . Vicodin [Hydrocodone-Acetaminophen] Itching and Rash    Patient Measurements: Height: 5\' 1"  (154.9 cm) Weight:  (uta, on non weighing stretcher, sob unable to stand) IBW/kg (Calculated) : 47.8   Vital Signs: Temp: 97.7 F (36.5 C) (03/08 0347) BP: 135/72 mmHg (03/08 0710) Pulse Rate: 109 (03/08 0710) Intake/Output from previous day:   Intake/Output from this shift:    Labs:  Recent Labs  08/03/14 0448  WBC 10.1  HGB 10.5*  PLT 219  CREATININE 11.78*   Estimated Creatinine Clearance: 5.5 mL/min (by C-G formula based on Cr of 11.78). No results for input(s): VANCOTROUGH, VANCOPEAK, VANCORANDOM, GENTTROUGH, GENTPEAK, GENTRANDOM, TOBRATROUGH, TOBRAPEAK, TOBRARND, AMIKACINPEAK, AMIKACINTROU, AMIKACIN in the last 72 hours.   Microbiology: No results found for this or any previous visit (from the past 720 hour(s)).  Medical History: Past Medical History  Diagnosis Date  . Hyperlipidemia   . Alopecia   . Obesity   . Iron deficiency   . ESRD (end stage renal disease)     S/p pancreatic and kidney transplant, but back on HD 2008, dialysis at [facility], [place], Sat  . RLS (restless legs syndrome)   . Injury of conjunctiva and corneal abrasion of right eye without foreign body   . Cellulitis   . Anemia   . Blood transfusion 2004    "when I had my transplant"  . Migraine   . Hyperparathyroidism, secondary   . Diabetic retinopathy   . Hypertension     takes Metoprolol and Exforge daily  . Depression      takes Klonopin nightly  . Diabetes mellitus type 1     Pt states diagnosed at age 44 with prior episodes of DKA. S/p pancreatic transplant   . GERD (gastroesophageal reflux disease)     takes Protonix daily  . Bronchitis   . Migraine   . Asthma   . Emphysema of lung   . Angina     none in past 2 years.  . Stomach pain     Hx: of chronic  . Pneumonia 2012    "double"  . Dialysis patient     tues,thurs,sat  . 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  Surgery was done at [facility].       Medications:  See medication history Assessment: 44 yo lady presents with SOB to start broad spectrum antibiotics for r/o PNA.  She is ESRD on HD TThS and will have HD this am.  Goal of Therapy:  preHD vanc level 20-25 mg/L  Plan:  Vancomycin 1250 mg IV X 1 today then 750 mg after each HD Cefepime 2 gm IV after each HD. F/u clinical progress, cultures and respiratory virus panel.  Thanks for allowing pharmacy to be a part of this patient's care.  Excell Seltzer, PharmD Clinical Pharmacist, [phone] 08/03/2014,7:57  AM

## 2014-08-03 NOTE — ED Notes (Signed)
Pt is from home, pt presents to the department with SOB, reports a productive cough (yellow sputum), A&O X4. EMS reports pt was 82% on RA, 91% on 4L 02. Pt is suppose to dialyze this morning (T, TH, Sa dialysis).

## 2014-08-03 NOTE — ED Provider Notes (Signed)
CSN: [id number]     Arrival date & time 08/03/14  0344 History  This chart was scribed for Julianne Rice, MD by Rayfield Citizen, ED Scribe. This patient was seen in room [facility] and the patient's care was started at 3:55 AM.    Chief Complaint  Patient presents with  . Shortness of Breath   Patient is a 44 y.o. female presenting with shortness of breath. The history is provided by the EMS personnel and the patient. No language interpreter was used.  Shortness of Breath Associated symptoms: cough, fever and vomiting   Associated symptoms: no abdominal pain, no chest pain, no headaches, no neck pain and no rash      HPI Comments: [name] is a 44 y.o. 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 She notes productive cough (yellow sputum) and fever (TMAX 101) with chills. Per nurse's note, EMS reported O2 stats of 82% on RA then 91% on 4L/min [place].   She is occasionally on 4L/min home O2. She is dialyzed Tues, Thurs, Sats; she is scheduled for dialysis later this morning. She denies missing any treatments.   Past Medical History  Diagnosis Date  . Hyperlipidemia   . Alopecia   . Obesity   . Iron deficiency   . ESRD (end stage renal disease)     S/p pancreatic and kidney transplant, but back on HD 2008, dialysis at [facility], [place], Sat  . RLS (restless legs syndrome)   . Injury of conjunctiva and corneal abrasion of right eye without foreign body   . Cellulitis   . Anemia   . Blood transfusion 2004    "when I had my transplant"  . Migraine   . Hyperparathyroidism, secondary   . Diabetic retinopathy   . Hypertension     takes Metoprolol and Exforge daily  . Depression     takes Klonopin nightly  . Diabetes mellitus type 1     Pt states diagnosed at age 65 with prior episodes of DKA.  S/p pancreatic transplant   . GERD (gastroesophageal reflux disease)     takes Protonix daily  . Bronchitis   . Migraine   . Asthma   . Emphysema of lung   . Angina     none in past 2 years.  . Stomach pain     Hx: of chronic  . Pneumonia 2012    "double"  . Dialysis patient     tues,thurs,sat  . 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  Surgery was done at [facility].      Past Surgical History  Procedure Laterality Date  . 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 Thyroglobulin      x 7  . Av fistula placement      right upper arm  . Cholecystectomy  1995  .  hd graft placement/removal      "had 2 in my left upper arm"  . Eye surgery    . Retinopathy surgery    . Tooth extraction  10/10/11    X's two  . Insertion of dialysis catheter  12/08/2011    Procedure: INSERTION OF DIALYSIS CATHETER;  Surgeon: Angelia Mould, MD;  Location: [place];  Service: Vascular;  Laterality: Left;  . Av fistula placement  01/22/2012    Procedure: INSERTION OF ARTERIOVENOUS (AV) GORE-TEX GRAFT ARM;  Surgeon: Angelia Mould, MD;  Location: [place];  Service: Vascular;  Laterality: Left;  Ultrasound guided.  . Av fistula placement  03/14/2012    Procedure: INSERTION OF ARTERIOVENOUS (AV) GORE-TEX GRAFT THIGH;  Surgeon: Angelia Mould, MD;  Location: [place];  Service: Vascular;  Laterality: Right;  . False aneurysm repair Right 01/02/2013    Procedure: Excision of lymphocele in right thigh.;  Surgeon: Angelia Mould, MD;  Location: [place];  Service: Vascular;  Laterality: Right;  . Carpal tunnel release Right 03/02/2013    Procedure: CARPAL TUNNEL RELEASE;  Surgeon: Tennis Must, MD;  Location: [place];  Service: Orthopedics;   Laterality: Right;  . Flexible sigmoidoscopy N/A 03/24/2013    Procedure: FLEXIBLE SIGMOIDOSCOPY;  Surgeon: Cleotis Nipper, MD;  Location: [facility] ENDOSCOPY;  Service: Endoscopy;  Laterality: N/A;  . Revision of arteriovenous goretex graft Right 04/02/2013    Procedure: REPAIR OF PSEUDOANEURYSM OF ARTERIOVENOUS GORETEX GRAFT;  Surgeon: Angelia Mould, MD;  Location: [place];  Service: Vascular;  Laterality: Right;  . Carpal tunnel release Left 10/16/2013    Procedure: LEFT CARPAL TUNNEL RELEASE;  Surgeon: Tennis Must, MD;  Location: [place];  Service: Orthopedics;  Laterality: Left;  . Unilateral upper extremeity angiogram N/A 11/12/2011    Procedure: UNILATERAL UPPER Anselmo Rod;  Surgeon: Angelia Mould, MD;  Location: [facility] CATH LAB;  Service: Cardiovascular;  Laterality: N/A;  . Fistulogram Left 03/03/2012    Procedure: FISTULOGRAM;  Surgeon: Angelia Mould, MD;  Location: [facility] CATH LAB;  Service: Cardiovascular;  Laterality: Left;   Family History  Problem Relation Age of Onset  . Hypertension Mother   . Kidney disease Mother   . Diabetes Mother   . Hypertension Father   . Kidney disease Father   . Diabetes Father    History  Substance Use Topics  . Smoking status: Never Smoker   . Smokeless tobacco: Never Used  . Alcohol Use: No   OB History    No data available     Review of Systems  Constitutional: Positive for fever, chills and fatigue.  Respiratory: Positive for cough and shortness of breath.   Cardiovascular: Negative for chest pain and leg swelling.  Gastrointestinal: Positive for nausea and vomiting. Negative for abdominal pain.  Musculoskeletal: Negative for back pain, neck pain and neck stiffness.  Skin: Negative for rash and wound.  Neurological: Negative for dizziness, weakness, light-headedness, numbness and headaches.  All other systems reviewed and are negative.  Allergies  Depakote; Morphine and related; Penicillins;  Tramadol; Imitrex; and Vicodin  Home Medications   Prior to Admission medications   Medication Sig Start Date End Date Taking? Authorizing Provider  albuterol (PROVENTIL HFA;VENTOLIN HFA) 108 (90 BASE) MCG/ACT inhaler Inhale 2 puffs into the lungs every 4 (four) hours as needed for wheezing or shortness of breath (as per home regimen). 02/20/13  Yes Kinnie Feil, MD  aspirin 325 MG tablet Take 1 tablet (325 mg total) by mouth daily. 05/20/14  Yes Velvet Bathe, MD  calcium acetate (PHOSLO) 667 MG capsule Take 3 capsules (2,001 mg total) by mouth 3 (three) times daily with meals. 05/20/14  Yes Velvet Bathe, MD  clonazePAM Bobbye [place]) 0.5  MG tablet Take 0.5 mg by mouth 2 (two) times daily.    Yes Historical Provider, MD  gabapentin (NEURONTIN) 100 MG capsule Take 100 mg by mouth 2 (two) times daily.    Yes Historical Provider, MD  insulin aspart (NOVOLOG) 100 UNIT/ML injection Inject 0-9 Units into the skin 3 (three) times daily with meals. Patient taking differently: Inject 0-9 Units into the skin 2 (two) times daily with a meal.  04/20/14  Yes Domenic Polite, MD  insulin glargine (LANTUS) 100 UNIT/ML injection Inject 0.05 mLs (5 Units total) into the skin 2 (two) times daily. Patient taking differently: Inject 6 Units into the skin 2 (two) times daily.  05/20/14  Yes Velvet Bathe, MD  lidocaine (LIDODERM) 5 % Place 1 patch onto the skin daily. Remove & Discard patch within 12 hours or as directed by MD 05/04/14  Yes Liam Graham, PA-C  metoCLOPramide (REGLAN) 5 MG tablet Take 1 tablet (5 mg total) by mouth 4 (four) times daily -  before meals and at bedtime. 12/01/13  Yes Janece Canterbury, MD  metoprolol succinate (TOPROL-XL) 100 MG 24 hr tablet Take 1 tablet (100 mg total) by mouth daily. Take with or immediately following a meal. 05/20/14  Yes Velvet Bathe, MD  montelukast (SINGULAIR) 10 MG tablet Take 10 mg by mouth at bedtime.   Yes Historical Provider, MD  multivitamin (RENA-VIT) TABS tablet  Take 1 tablet by mouth daily.   Yes Historical Provider, MD  Nutritional Supplements (FEEDING SUPPLEMENT, NEPRO CARB STEADY,) LIQD Take 237 mLs by mouth 2 (two) times daily between meals. 04/23/14  Yes Orson Eva, MD  pantoprazole (PROTONIX) 40 MG tablet Take 1 tablet (40 mg total) by mouth at bedtime. 03/27/13  Yes Hosie Poisson, MD  promethazine (PHENERGAN) 25 MG tablet Take 25 mg by mouth every 6 (six) hours as needed for nausea or vomiting.   Yes Historical Provider, MD  simvastatin (ZOCOR) 20 MG tablet Take 1 tablet (20 mg total) by mouth at bedtime. 05/20/14  Yes Velvet Bathe, MD  zolpidem (AMBIEN) 5 MG tablet Take 5 mg by mouth at bedtime.   Yes Historical Provider, MD  oxyCODONE-acetaminophen (PERCOCET/ROXICET) 5-325 MG per tablet Take 1 tablet by mouth every 6 (six) hours as needed for moderate pain or severe pain. Patient not taking: Reported on 05/18/2014 05/04/14   Liam Graham, PA-C   BP 152/30 mmHg  Pulse 100  Temp(Src) 98.7 F (37.1 C) (Oral)  Resp 20  Ht 5\' 1"  (1.549 m)  Wt 158 lb 12.8 oz (72.031 kg)  BMI 30.02 kg/m2  SpO2 99%  LMP 08/01/2014 (Exact Date) Physical Exam  Constitutional: She is oriented to person, place, and time. She appears well-developed and well-nourished. No distress.  HENT:  Head: Normocephalic and atraumatic.  Mouth/Throat: Oropharynx is clear and moist. No oropharyngeal exudate.  Eyes: EOM are normal. Pupils are equal, round, and reactive to light.  Neck: Normal range of motion. Neck supple.  Cardiovascular: Normal rate and regular rhythm.   Pulmonary/Chest: No respiratory distress. She has no wheezes. She has rales.  Increased respiratory effort. Fine bilateral basilar crackles. Prolonged expiratory phase with intermittent wheezing throughout.  Abdominal: Soft. Bowel sounds are normal. She exhibits no distension and no mass. There is no tenderness. There is no rebound and no guarding.  Musculoskeletal: Normal range of motion. She exhibits no edema  or tenderness.  Neurological: She is alert and oriented to person, place, and time.  Results from this without deficit. Sensation is grossly  intact.  Skin: Skin is warm and dry. No rash noted. No erythema.  Psychiatric: She has a normal mood and affect. Her behavior is normal.  Nursing note and vitals reviewed.   ED Course  Procedures   DIAGNOSTIC STUDIES: Oxygen Saturation is 97% on [place] 4L/min, adequate by my interpretation.    COORDINATION OF CARE: 4:00 AM Discussed treatment plan with pt at bedside and pt agreed to plan.   Labs Review Labs Reviewed  COMPREHENSIVE METABOLIC PANEL - Abnormal; Notable for the following:    Sodium 120 (*)    Potassium >7.5 (*)    Chloride 84 (*)    CO2 17 (*)    Glucose, Bld 1211 (*)    BUN 69 (*)    Creatinine, Ser 11.78 (*)    Calcium 8.2 (*)    Alkaline Phosphatase 178 (*)    GFR calc non Af Amer 3 (*)    GFR calc Af Amer 4 (*)    Anion gap 19 (*)    All other components within normal limits  CBC - Abnormal; Notable for the following:    RBC 3.32 (*)    Hemoglobin 10.5 (*)    HCT 35.6 (*)    MCV 107.2 (*)    MCHC 29.5 (*)    All other components within normal limits  GLUCOSE, CAPILLARY - Abnormal; Notable for the following:    Glucose-Capillary 152 (*)    All other components within normal limits  CBC - Abnormal; Notable for the following:    WBC 10.7 (*)    RBC 2.99 (*)    Hemoglobin 9.5 (*)    HCT 29.2 (*)    All other components within normal limits  CREATININE, SERUM - Abnormal; Notable for the following:    Creatinine, Ser 5.20 (*)    GFR calc non Af Amer 9 (*)    GFR calc Af Amer 11 (*)    All other components within normal limits  GLUCOSE, CAPILLARY - Abnormal; Notable for the following:    Glucose-Capillary 172 (*)    All other components within normal limits  GLUCOSE, CAPILLARY - Abnormal; Notable for the following:    Glucose-Capillary 143 (*)    All other components within normal limits  GLUCOSE, CAPILLARY -  Abnormal; Notable for the following:    Glucose-Capillary 232 (*)    All other components within normal limits  GLUCOSE, CAPILLARY - Abnormal; Notable for the following:    Glucose-Capillary 243 (*)    All other components within normal limits  GLUCOSE, CAPILLARY - Abnormal; Notable for the following:    Glucose-Capillary 214 (*)    All other components within normal limits  GLUCOSE, CAPILLARY - Abnormal; Notable for the following:    Glucose-Capillary 130 (*)    All other components within normal limits  BASIC METABOLIC PANEL - Abnormal; Notable for the following:    Sodium 132 (*)    Potassium 6.7 (*)    Chloride 93 (*)    Glucose, Bld 211 (*)    BUN 26 (*)    Creatinine, Ser 5.88 (*)    GFR calc non Af Amer 8 (*)    GFR calc Af Amer 9 (*)    Anion gap 16 (*)    All other components within normal limits  GLUCOSE, CAPILLARY - Abnormal; Notable for the following:    Glucose-Capillary 125 (*)    All other components within normal limits  GLUCOSE, CAPILLARY - Abnormal; Notable for the following:  Glucose-Capillary 215 (*)    All other components within normal limits  CBG MONITORING, ED - Abnormal; Notable for the following:    Glucose-Capillary >600 (*)    All other components within normal limits  I-STAT ARTERIAL BLOOD GAS, ED - Abnormal; Notable for the following:    pH, Arterial 7.130 (*)    pO2, Arterial 57.0 (*)    Bicarbonate 13.9 (*)    Acid-base deficit 15.0 (*)    All other components within normal limits  I-STAT CHEM 8, ED - Abnormal; Notable for the following:    Sodium 134 (*)    BUN 38 (*)    Creatinine, Ser 6.30 (*)    Glucose, Bld >700 (*)    All other components within normal limits  MRSA PCR SCREENING  INFLUENZA VIRUS AG, A+B (DFA)  RESPIRATORY VIRUS PANEL  CULTURE, BLOOD (ROUTINE X 2)  CULTURE, BLOOD (ROUTINE X 2)  INFLUENZA PANEL BY PCR (TYPE A & B, H1N1)  GLUCOSE, CAPILLARY  GLUCOSE, CAPILLARY  GLUCOSE, CAPILLARY  GLUCOSE, CAPILLARY  GLUCOSE,  CAPILLARY  URINALYSIS, ROUTINE W REFLEX MICROSCOPIC  CBC  BASIC METABOLIC PANEL  I-STAT TROPOININ, ED    Imaging Review Dg Chest Port 1 View  08/03/2014   CLINICAL DATA:  Shortness of breath, fever, vomiting for 1 week.  EXAM: PORTABLE CHEST - 1 VIEW  COMPARISON:  05/17/2014  FINDINGS: Cardiomegaly is stable. There is vascular congestion and pulmonary edema, this appears similar to prior exam. Suspect small bilateral pleural effusions with hazy opacity at the lung bases. No pneumothorax. No acute osseous abnormalities.  IMPRESSION: 1. Cardiomegaly, vascular congestion, pulmonary edema, consistent with congestive heart failure. This appears similar to prior exam. 2. Increased hazy opacity at the lung bases concerning for pleural effusions.   Electronically Signed   By: Jeb Levering M.D.   On: 08/03/2014 04:10     EKG Interpretation   Date/Time:  Tuesday August 03 2014 03:50:49 EST Ventricular Rate:  99 PR Interval:  213 QRS Duration: 108 QT Interval:  385 QTC Calculation: 494 R Axis:   103 Text Interpretation:  Sinus rhythm Prolonged PR interval Right axis  deviation Borderline T wave abnormalities ST elev, probable normal early  repol pattern Borderline prolonged QT interval Confirmed by Lita Mains  MD,  Sherrian Nunnelley ([id number]) on 08/03/2014 6:09:30 AM      MDM   Final diagnoses:  Hypervolemia, unspecified hypervolemia type  Hyperkalemia  Diabetic ketoacidosis without coma associated with type 1 diabetes mellitus    I personally performed the services described in this documentation, which was scribed in my presence. The recorded information has been reviewed and is accurate.  No improvement with initial breathing treatment. Blood sugar greater than thousand. Left IJ established and insulin protocol initiated. Discussed with nephrologist on call will emergently dialyzed. Also discussed with hospitalist. Will admit to step down bed.    Julianne Rice, MD 08/04/14 [phone]

## 2014-08-03 NOTE — ED Notes (Signed)
IV team at BS 

## 2014-08-03 NOTE — Procedures (Signed)
Patient was seen on dialysis and the procedure was supervised.  BFR 400  Via AVG BP is  135/72.   Patient appears to be tolerating treatment well  [name] A 08/03/2014

## 2014-08-03 NOTE — ED Notes (Signed)
Pt is NPO at this time. Antibiotics brought down.

## 2014-08-03 NOTE — Consult Note (Signed)
Reason for Consult: To manage dialysis and dialysis related needs Referring Physician: [name] is an 44 y.o. female with PMhx significant for DM, poorly controlled, HTN, obesity, depression, s/p failed both renal and pancreas transplants- now ESRD on [facility] at [facility] on [facility] via thigh AVG.  Patient denies missing any treatments.  Says she started feeling badly on Sunday, then had fever on Monday with vomiting- possible sick contacts at the dialysis unit.  She had intervention on her leg AVG yest, then came to the ER- sugar was noted to be over 1000 and K of greater than 7.5 so is getting emergent HD this AM   Dialyzes at [place] 68 (looked like it could be lowered ). HD Bath 2 K /2.5 calc, Dialyzer 160, Heparin none. Access thigh AVG  hectorol 2./aranesp 40 weekly and venofer 100   Past Medical History  Diagnosis Date  . Hyperlipidemia   . Alopecia   . Obesity   . Iron deficiency   . ESRD (end stage renal disease)     S/p pancreatic and kidney transplant, but back on HD 2008, dialysis at [facility], [place], Sat  . RLS (restless legs syndrome)   . Injury of conjunctiva and corneal abrasion of right eye without foreign body   . Cellulitis   . Anemia   . Blood transfusion 2004    "when I had my transplant"  . Migraine   . Hyperparathyroidism, secondary   . Diabetic retinopathy   . Hypertension     takes Metoprolol and Exforge daily  . Depression     takes Klonopin nightly  . Diabetes mellitus type 1     Pt states diagnosed at age 43 with prior episodes of DKA. S/p pancreatic transplant   . GERD (gastroesophageal reflux disease)     takes Protonix daily  . Bronchitis   . Migraine   . Asthma   . Emphysema of lung   . Angina     none in past 2 years.  . Stomach pain     Hx: of chronic  . Pneumonia 2012    "double"  . Dialysis patient     tues,thurs,sat  . 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  Surgery was done at [facility].       Past Surgical History  Procedure Laterality Date  . 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 Thyroglobulin      x 7  . Av fistula placement      right upper arm  . Cholecystectomy  1995  .  hd graft placement/removal      "had 2 in my left upper arm"  . Eye surgery    . Retinopathy surgery    . Tooth extraction  10/10/11    X's two  . Insertion of dialysis catheter  12/08/2011    Procedure: INSERTION OF DIALYSIS CATHETER;  Surgeon: Angelia Mould, MD;  Location: [place];  Service: Vascular;  Laterality: Left;  . Av fistula placement  01/22/2012    Procedure: INSERTION OF ARTERIOVENOUS (AV) GORE-TEX GRAFT ARM;  Surgeon: Angelia Mould, MD;  Location: [place];  Service: Vascular;  Laterality: Left;  Ultrasound guided.  . Av fistula placement  03/14/2012  Procedure: INSERTION OF ARTERIOVENOUS (AV) GORE-TEX GRAFT THIGH;  Surgeon: Angelia Mould, MD;  Location: [place];  Service: Vascular;  Laterality: Right;  . False aneurysm repair Right 01/02/2013    Procedure: Excision of lymphocele in right thigh.;  Surgeon: Angelia Mould, MD;  Location: [place];  Service: Vascular;  Laterality: Right;  . Carpal tunnel release Right 03/02/2013    Procedure: CARPAL TUNNEL RELEASE;  Surgeon: Tennis Must, MD;  Location: [place];  Service: Orthopedics;  Laterality: Right;  . Flexible sigmoidoscopy N/A 03/24/2013    Procedure: FLEXIBLE SIGMOIDOSCOPY;  Surgeon: Cleotis Nipper, MD;  Location: [facility] ENDOSCOPY;  Service: Endoscopy;  Laterality: N/A;  . Revision of arteriovenous goretex graft Right 04/02/2013    Procedure: REPAIR OF PSEUDOANEURYSM OF ARTERIOVENOUS GORETEX GRAFT;  Surgeon: Angelia Mould, MD;  Location: [place];  Service: Vascular;   Laterality: Right;  . Carpal tunnel release Left 10/16/2013    Procedure: LEFT CARPAL TUNNEL RELEASE;  Surgeon: Tennis Must, MD;  Location: [place];  Service: Orthopedics;  Laterality: Left;  . Unilateral upper extremeity angiogram N/A 11/12/2011    Procedure: UNILATERAL UPPER Anselmo Rod;  Surgeon: Angelia Mould, MD;  Location: [facility] CATH LAB;  Service: Cardiovascular;  Laterality: N/A;  . Fistulogram Left 03/03/2012    Procedure: FISTULOGRAM;  Surgeon: Angelia Mould, MD;  Location: [facility] CATH LAB;  Service: Cardiovascular;  Laterality: Left;    Family History  Problem Relation Age of Onset  . Hypertension Mother   . Kidney disease Mother   . Diabetes Mother   . Hypertension Father   . Kidney disease Father   . Diabetes Father     Social History:  reports that she has never smoked. She has never used smokeless tobacco. She reports that she does not drink alcohol or use illicit drugs.  Allergies:  Allergies  Allergen Reactions  . Depakote [Divalproex Sodium] Other (See Comments)    Pt gets "delirious"  . Morphine And Related Nausea And Vomiting and Other (See Comments)    "makes me delirious"  . Penicillins Anaphylaxis    Tolerated Zosyn Dec 2014  . Tramadol Nausea And Vomiting  . Imitrex [Sumatriptan] Other (See Comments)    seizures  . Vicodin [Hydrocodone-Acetaminophen] Itching and Rash    Medications: I have reviewed the patient's current medications.     Results for orders placed or performed during the hospital encounter of 08/03/14 (from the past 48 hour(s))  Comprehensive metabolic panel     Status: Abnormal   Collection Time: 08/03/14  4:48 AM  Result Value Ref Range   Sodium 120 (L) 135 - 145 mmol/L   Potassium >7.5 (HH) 3.5 - 5.1 mmol/L    Comment: NO VISIBLE HEMOLYSIS REPEATED TO VERIFY CRITICAL RESULT CALLED TO, READ BACK BY AND VERIFIED WITH: LAMBERT B,RN 08/03/14 0603 WAYK    Chloride 84 (L) 96 - 112 mmol/L   CO2  17 (L) 19 - 32 mmol/L   Glucose, Bld 1211 (HH) 70 - 99 mg/dL    Comment: RESULTS CONFIRMED BY MANUAL DILUTION CRITICAL RESULT CALLED TO, READ BACK BY AND VERIFIED WITH: LAMBERT B,RN 08/03/14 0603 WAYK    BUN 69 (H) 6 - 23 mg/dL   Creatinine, Ser 11.78 (H) 0.50 - 1.10 mg/dL   Calcium 8.2 (L) 8.4 - 10.5 mg/dL   Total Protein 7.1 6.0 - 8.3 g/dL   Albumin 3.6 3.5 - 5.2 g/dL   AST 34 0 - 37 U/L  ALT 19 0 - 35 U/L   Alkaline Phosphatase 178 (H) 39 - 117 U/L   Total Bilirubin 1.0 0.3 - 1.2 mg/dL   GFR calc non Af Amer 3 (L) >90 mL/min   GFR calc Af Amer 4 (L) >90 mL/min    Comment: (NOTE) The eGFR has been calculated using the CKD EPI equation. This calculation has not been validated in all clinical situations. eGFR's persistently <90 mL/min signify possible Chronic Kidney Disease.    Anion gap 19 (H) 5 - 15  CBC     Status: Abnormal   Collection Time: 08/03/14  4:48 AM  Result Value Ref Range   WBC 10.1 4.0 - 10.5 K/uL   RBC 3.32 (L) 3.87 - 5.11 MIL/uL   Hemoglobin 10.5 (L) 12.0 - 15.0 g/dL   HCT 35.6 (L) 36.0 - 46.0 %   MCV 107.2 (H) 78.0 - 100.0 fL   MCH 31.6 26.0 - 34.0 pg   MCHC 29.5 (L) 30.0 - 36.0 g/dL   RDW 14.9 11.5 - 15.5 %   Platelets 219 150 - 400 K/uL  I-stat troponin, ED (not at [facility])     Status: None   Collection Time: 08/03/14  4:51 AM  Result Value Ref Range   Troponin i, poc 0.00 0.00 - 0.08 ng/mL   Comment 3            Comment: Due to the release kinetics of cTnI, a negative result within the first hours of the onset of symptoms does not rule out myocardial infarction with certainty. If myocardial infarction is still suspected, repeat the test at appropriate intervals.   CBG monitoring, ED     Status: Abnormal   Collection Time: 08/03/14  6:32 AM  Result Value Ref Range   Glucose-Capillary >600 (HH) 70 - 99 mg/dL  I-Stat arterial blood gas, ED     Status: Abnormal   Collection Time: 08/03/14  6:35 AM  Result Value Ref Range   pH, Arterial 7.130 (LL)  7.350 - 7.450   pCO2 arterial 41.8 35.0 - 45.0 mmHg   pO2, Arterial 57.0 (L) 80.0 - 100.0 mmHg   Bicarbonate 13.9 (L) 20.0 - 24.0 mEq/L   TCO2 15 0 - 100 mmol/L   O2 Saturation 79.0 %   Acid-base deficit 15.0 (H) 0.0 - 2.0 mmol/L   Patient temperature 98.6 F    Collection site RADIAL, ALLEN'S TEST ACCEPTABLE    Drawn by RT    Sample type ARTERIAL    Comment NOTIFIED PHYSICIAN     Dg Chest Port 1 View  08/03/2014   CLINICAL DATA:  Shortness of breath, fever, vomiting for 1 week.  EXAM: PORTABLE CHEST - 1 VIEW  COMPARISON:  05/17/2014  FINDINGS: Cardiomegaly is stable. There is vascular congestion and pulmonary edema, this appears similar to prior exam. Suspect small bilateral pleural effusions with hazy opacity at the lung bases. No pneumothorax. No acute osseous abnormalities.  IMPRESSION: 1. Cardiomegaly, vascular congestion, pulmonary edema, consistent with congestive heart failure. This appears similar to prior exam. 2. Increased hazy opacity at the lung bases concerning for pleural effusions.   Electronically Signed   By: Jeb Levering M.D.   On: 08/03/2014 04:10    ROS: cough, SOB, nausea, high sugar, aches- negative for CP- other ROS negative   Blood pressure 135/72, pulse 109, temperature 97.7 F (36.5 C), resp. rate 16, height '5\' 1"'  (1.549 m), weight 65.772 kg (145 lb), last menstrual period 08/01/2014, SpO2 100 %. General appearance:  alert, distracted and mild distress Eyes: conjunctivae/corneas clear. PERRL, EOM's intact. Fundi benign. Neck: no adenopathy, no carotid bruit, no JVD, supple, symmetrical, trachea midline and thyroid not enlarged, symmetric, no tenderness/mass/nodules Resp: diminished breath sounds bilaterally Cardio: regular rate and rhythm, S1, S2 normal, no murmur, click, rub or gallop GI: soft, non-tender; bowel sounds normal; no masses,  no organomegaly Extremities: edema mild thigh AVG  Assessment/Plan: 44 year old BF with DM and ESRD presenting with  possible viral syndrome with hyperglycemia and hyperkalemia 1 ID- possible viral syndrome with fever/respiratory and GI symptoms- supportive care 2 ESRD: [facility] TTS at [facility] via thigh AVG- HD this AM due to alarming labs especially high potassium 3. Hyperkalemia- mostly due to high sugar- should resolve quickly as sugar normalizes- keep on 2 K bath- check in mid HD 4 Hypertension: volume removal as tolerated with respiratory symptoms 4. Anemia of ESRD: normally on 40 of aranesp- will continue here- hgb 10.5 5. Metabolic Bone Disease: will continue home hectorol med 6. DM- poorly controlled- not first admit with this issue.  HD should help tremendously- insulin drip on standby   Thank you for this consult, we will follow with you   Faustina Gebert A 08/03/2014, 7:51 AM

## 2014-08-04 ENCOUNTER — Inpatient Hospital Stay (HOSPITAL_COMMUNITY): Payer: Medicare Other

## 2014-08-04 DIAGNOSIS — E8770 Fluid overload, unspecified: Secondary | ICD-10-CM

## 2014-08-04 LAB — BASIC METABOLIC PANEL
ANION GAP: 16 — AB (ref 5–15)
ANION GAP: 10 (ref 5–15)
BUN: 26 mg/dL — ABNORMAL HIGH (ref 6–23)
BUN: 30 mg/dL — AB (ref 6–23)
CO2: 23 mmol/L (ref 19–32)
CO2: 28 mmol/L (ref 19–32)
Calcium: 8.4 mg/dL (ref 8.4–10.5)
Calcium: 9.2 mg/dL (ref 8.4–10.5)
Chloride: 93 mmol/L — ABNORMAL LOW (ref 96–112)
Chloride: 100 mmol/L (ref 96–112)
Creatinine, Ser: 5.88 mg/dL — ABNORMAL HIGH (ref 0.50–1.10)
Creatinine, Ser: 6.28 mg/dL — ABNORMAL HIGH (ref 0.50–1.10)
GFR calc Af Amer: 9 mL/min — ABNORMAL LOW (ref >90)
GFR calc non Af Amer: 8 mL/min — ABNORMAL LOW (ref >90)
GFR calc non Af Amer: 7 mL/min — ABNORMAL LOW (ref >90)
GFR, EST AFRICAN AMERICAN: 9 mL/min — AB (ref >90)
Glucose, Bld: 211 mg/dL — ABNORMAL HIGH (ref 70–99)
Glucose, Bld: 20 mg/dL — CL (ref 70–99)
POTASSIUM: 6.7 mmol/L — AB (ref 3.5–5.1)
Potassium: 4.7 mmol/L (ref 3.5–5.1)
Sodium: 132 mmol/L — ABNORMAL LOW (ref 135–145)
Sodium: 138 mmol/L (ref 135–145)

## 2014-08-04 LAB — CBC
HCT: 29.4 % — ABNORMAL LOW (ref 36.0–46.0)
HEMOGLOBIN: 9.6 g/dL — AB (ref 12.0–15.0)
MCH: 32 pg (ref 26.0–34.0)
MCHC: 32.7 g/dL (ref 30.0–36.0)
MCV: 98 fL (ref 78.0–100.0)
PLATELETS: 166 10*3/uL (ref 150–400)
RBC: 3 MIL/uL — ABNORMAL LOW (ref 3.87–5.11)
RDW: 14.3 % (ref 11.5–15.5)
WBC: 10.7 10*3/uL — ABNORMAL HIGH (ref 4.0–10.5)

## 2014-08-04 LAB — GLUCOSE, CAPILLARY
GLUCOSE-CAPILLARY: 125 mg/dL — AB (ref 70–99)
GLUCOSE-CAPILLARY: 127 mg/dL — AB (ref 70–99)
GLUCOSE-CAPILLARY: 193 mg/dL — AB (ref 70–99)
GLUCOSE-CAPILLARY: 253 mg/dL — AB (ref 70–99)
GLUCOSE-CAPILLARY: 34 mg/dL — AB (ref 70–99)
GLUCOSE-CAPILLARY: 391 mg/dL — AB (ref 70–99)
GLUCOSE-CAPILLARY: 54 mg/dL — AB (ref 70–99)
Glucose-Capillary: 215 mg/dL — ABNORMAL HIGH (ref 70–99)
Glucose-Capillary: 288 mg/dL — ABNORMAL HIGH (ref 70–99)
Glucose-Capillary: 91 mg/dL (ref 70–99)
Glucose-Capillary: 14 mg/dL — CL (ref 70–99)
Glucose-Capillary: 50 mg/dL — ABNORMAL LOW (ref 70–99)
Glucose-Capillary: 43 mg/dL — CL (ref 70–99)
Glucose-Capillary: 53 mg/dL — ABNORMAL LOW (ref 70–99)
Glucose-Capillary: 217 mg/dL — ABNORMAL HIGH (ref 70–99)

## 2014-08-04 LAB — VANCOMYCIN, RANDOM: Vancomycin Rm: <3.5 ug/mL

## 2014-08-04 MED ORDER — ALTEPLASE 2 MG IJ SOLR
2.0000 mg | Freq: Once | INTRAMUSCULAR | Status: AC | PRN
  Filled 2014-08-04: qty 2

## 2014-08-04 MED ORDER — HEPARIN SODIUM (PORCINE) 1000 UNIT/ML DIALYSIS: 1000.0000 [IU] | INTRAMUSCULAR | Status: DC | PRN

## 2014-08-04 MED ORDER — DEXTROSE 50 % IV SOLN
INTRAVENOUS | Status: AC
  Administered 2014-08-04: 34 mL
  Filled 2014-08-04: qty 50

## 2014-08-04 MED ORDER — LIDOCAINE-PRILOCAINE 2.5-2.5 % EX CREA: 1.0000 "application " | TOPICAL_CREAM | CUTANEOUS | Status: DC | PRN

## 2014-08-04 MED ORDER — DEXTROSE 50 % IV SOLN
1.0000 | Freq: Once | INTRAVENOUS | Status: AC
  Administered 2014-08-04: 50 mL via INTRAVENOUS
  Filled 2014-08-04: qty 50

## 2014-08-04 MED ORDER — DEXTROSE 5 % IV SOLN
1.0000 g | Freq: Once | INTRAVENOUS | Status: AC
  Administered 2014-08-04: 1 g via INTRAVENOUS
  Filled 2014-08-04 (×2): qty 1

## 2014-08-04 MED ORDER — INSULIN GLARGINE 100 UNIT/ML ~~LOC~~ SOLN
6.0000 [IU] | Freq: Two times a day (BID) | SUBCUTANEOUS | Status: DC
  Administered 2014-08-04 – 2014-08-05 (×2): 6 [IU] via SUBCUTANEOUS
  Filled 2014-08-04 (×3): qty 0.06

## 2014-08-04 MED ORDER — ALBUTEROL SULFATE (2.5 MG/3ML) 0.083% IN NEBU: 2.5000 mg | INHALATION_SOLUTION | RESPIRATORY_TRACT | Status: DC | PRN

## 2014-08-04 MED ORDER — ACETAMINOPHEN 325 MG PO TABS
650.0000 mg | ORAL_TABLET | Freq: Four times a day (QID) | ORAL | Status: DC | PRN
  Administered 2014-08-04: 650 mg via ORAL
  Filled 2014-08-04: qty 2

## 2014-08-04 MED ORDER — PENTAFLUOROPROP-TETRAFLUOROETH EX AERO
1.0000 | INHALATION_SPRAY | CUTANEOUS | Status: DC | PRN
Start: 2014-08-04 — End: 2014-08-05

## 2014-08-04 MED ORDER — SODIUM CHLORIDE 0.9 % IV SOLN
1.0000 g | Freq: Once | INTRAVENOUS | Status: AC
  Administered 2014-08-04: 1 g via INTRAVENOUS
  Filled 2014-08-04: qty 10

## 2014-08-04 MED ORDER — INSULIN ASPART 100 UNIT/ML ~~LOC~~ SOLN
10.0000 [IU] | Freq: Once | SUBCUTANEOUS | Status: AC
  Administered 2014-08-04: 10 [IU] via SUBCUTANEOUS

## 2014-08-04 MED ORDER — IPRATROPIUM-ALBUTEROL 0.5-2.5 (3) MG/3ML IN SOLN
3.0000 mL | Freq: Three times a day (TID) | RESPIRATORY_TRACT | Status: DC
  Administered 2014-08-04 – 2014-08-05 (×4): 3 mL via RESPIRATORY_TRACT
  Filled 2014-08-04 (×4): qty 3
  Filled 2014-08-04: qty 39

## 2014-08-04 MED ORDER — LIDOCAINE HCL (PF) 1 % IJ SOLN: 5.0000 mL | INTRAMUSCULAR | Status: DC | PRN

## 2014-08-04 MED ORDER — DEXTROSE 50 % IV SOLN
INTRAVENOUS | Status: AC
  Filled 2014-08-04: qty 50

## 2014-08-04 MED ORDER — SODIUM CHLORIDE 0.9 % IV SOLN: 100.0000 mL | INTRAVENOUS | Status: DC | PRN

## 2014-08-04 MED ORDER — NEPRO/CARBSTEADY PO LIQD: 237.0000 mL | ORAL | Status: DC | PRN

## 2014-08-04 MED ORDER — VANCOMYCIN HCL 10 G IV SOLR
1500.0000 mg | Freq: Once | INTRAVENOUS | Status: DC
  Filled 2014-08-04: qty 1500

## 2014-08-04 MED ORDER — INSULIN ASPART 100 UNIT/ML ~~LOC~~ SOLN
0.0000 [IU] | Freq: Three times a day (TID) | SUBCUTANEOUS | Status: DC
  Administered 2014-08-04: 9 [IU] via SUBCUTANEOUS
  Administered 2014-08-04: 2 [IU] via SUBCUTANEOUS
  Administered 2014-08-05: 7 [IU] via SUBCUTANEOUS

## 2014-08-04 MED ORDER — INSULIN ASPART 100 UNIT/ML ~~LOC~~ SOLN: 0.0000 [IU] | Freq: Every day | SUBCUTANEOUS | Status: DC

## 2014-08-04 MED ORDER — INSULIN GLARGINE 100 UNIT/ML ~~LOC~~ SOLN
5.0000 [IU] | Freq: Two times a day (BID) | SUBCUTANEOUS | Status: DC
  Administered 2014-08-04: 5 [IU] via SUBCUTANEOUS
  Filled 2014-08-04 (×2): qty 0.05

## 2014-08-04 NOTE — Progress Notes (Signed)
Hypoglycemic Event  CBG: 14  Treatment: 34 ml dextrose 50 per glucostabilizer  Symptoms: drowsiness  Follow-up CBG: Time: 0620 CBG Result: 50  Possible Reasons for Event: insulin gtt, dka protocol  Comments/MD notified: Forrest Moron    Ceriah Kohler, Blondell Reveal  Remember to initiate Hypoglycemia Order Set & complete

## 2014-08-04 NOTE — Progress Notes (Signed)
[name] Progress Note   Subjective: is deep asleep , will awaken for a few moments, can't stay awake  Filed Vitals:   08/04/14 0400 08/04/14 0729 08/04/14 0819 08/04/14 1145  BP: 141/69 154/81 158/74 166/79  Pulse:  88 95 103  Temp: 98.5 F (36.9 C) 98.6 F (37 C)  98.4 F (36.9 C)  TempSrc: Oral Axillary  Oral  Resp: 14 11 22 19   Height:      Weight:      SpO2: 100% 100% 100% 100%   Exam: Alert, no distress, calm +JVD Chest clear bilat no wheezing RRR no RMG Abd soft, NTND, no mass or ascites No LE edema Neuro is nf, Ox 3 Thigh AVG is patent  HD: [facility] TTS 68kg   2/2.5 Bath   F160   Heparin none   Thigh AVG  Summary: 44 year old BF with DM and ESRD presenting with possible viral syndrome with hyperglycemia and hyperkalemia      Assessment/Plan:  1 N/V/ low grade fever - cx's pending, on IV vanc/ cefipime. Possible viral syndrome, flu swab negative. Better 2 ESRD: high K resolved. HD tomorrow 3. Hyperkalemia- resolved 4 Hypertension: volume removal as tolerated with respiratory symptoms 4. Anemia of ESRD: normally on 40 of aranesp- will continue here- hgb 10.5 5. Metabolic Bone Disease: will continue home hectorol med 6. DM- uncontrolled, s/p IV insulin. Stable  Plan - hold Klonopin and Ambien for now, keep off sedating meds, BZD's and narcotics for now.  May be ok for discharge if wakes up. Will write HD orders in case here tomorrow.    Kelly Splinter MD  pager [phone]    cell [phone]  08/04/2014, 12:57 PM     Recent Labs Lab 08/03/14 0448 08/03/14 0821 08/03/14 1845 08/04/14 0125 08/04/14 0600  NA 120* 134*  --  132* 138  K >7.5* 4.3  --  6.7* 4.7  CL 84* 96  --  93* 100  CO2 17*  --   --  23 28  GLUCOSE 1211* >700*  --  211* 20*  BUN 69* 38*  --  26* 30*  CREATININE 11.78* 6.30* 5.20* 5.88* 6.28*  CALCIUM 8.2*  --   --  8.4 9.2    Recent Labs Lab 08/03/14 0448  AST 34  ALT 19  ALKPHOS 178*  BILITOT 1.0  PROT 7.1   ALBUMIN 3.6    Recent Labs Lab 08/03/14 0448 08/03/14 0821 08/03/14 1845 08/04/14 0432  WBC 10.1  --  10.7* 10.7*  HGB 10.5* 14.6 9.5* 9.6*  HCT 35.6* 43.0 29.2* 29.4*  MCV 107.2*  --  97.7 98.0  PLT 219  --  151 166   . antiseptic oral rinse  7 mL Mouth Rinse BID  . aspirin  325 mg Oral Daily  . benzonatate  200 mg Oral TID  . calcium acetate  2,001 mg Oral TID WC  . ceFEPime (MAXIPIME) IV  1 g Intravenous Once  . ceFEPime (MAXIPIME) IV  2 g Intravenous Q T,Th,Sa-HD  . clonazePAM  0.5 mg Oral BID  . [START ON 08/05/2014] darbepoetin (ARANESP) injection - DIALYSIS  40 mcg Intravenous Q Thu-HD  . doxercalciferol  2 mcg Intravenous Q T,Th,Sa-HD  . gabapentin  100 mg Oral BID  . heparin  5,000 Units Subcutaneous 3 times per day  . insulin aspart  0-5 Units Subcutaneous QHS  . insulin aspart  0-9 Units Subcutaneous TID WC  . insulin glargine  5 Units Subcutaneous BID  .  ipratropium-albuterol  3 mL Nebulization TID  . lidocaine  1 patch Transdermal Daily  . metoCLOPramide  5 mg Oral TID AC & HS  . metoprolol succinate  100 mg Oral Daily  . montelukast  10 mg Oral QHS  . multivitamin  1 tablet Oral Daily  . pantoprazole  40 mg Oral QHS  . simvastatin  20 mg Oral QHS  . sodium chloride  3 mL Intravenous Q12H  . vancomycin  1,250 mg Intravenous Once  . [START ON 08/05/2014] vancomycin  750 mg Intravenous Q T,Th,Sa-HD  . zolpidem  5 mg Oral QHS   . sodium chloride     albuterol, dextrose, HYDROcodone-homatropine

## 2014-08-04 NOTE — Progress Notes (Signed)
Transfer Note:  Patient arrived via wheelchair from [facility] with tech.  Alert and oriented x4.  Oriented to staff, unit, and room.  Educated and given hand hygiene packet from [facility] bundle.  Skin assessment performed with charge nurse, Earleen Reaper.  No telemetry ordered.  Patient educated on use of call bell; verbalized understanding.  Will continue to monitor.

## 2014-08-04 NOTE — Progress Notes (Signed)
ANTIBIOTIC CONSULT NOTE - FOLLOW UP  Pharmacy Consult for Vancomycin and Cefepime Indication: rule out pneumonia  Allergies  Allergen Reactions  . Depakote [Divalproex Sodium] Other (See Comments)    Pt gets "delirious"  . Morphine And Related Nausea And Vomiting and Other (See Comments)    "makes me delirious"  . Penicillins Anaphylaxis    Tolerated Zosyn Dec 2014  . Tramadol Nausea And Vomiting  . Imitrex [Sumatriptan] Other (See Comments)    seizures  . Vicodin [Hydrocodone-Acetaminophen] Itching and Rash    Patient Measurements: Height: 5\' 1"  (154.9 cm) Weight: 158 lb 12.8 oz (72.031 kg) IBW/kg (Calculated) : 47.8  Vital Signs: Temp: 98.4 F (36.9 C) (03/09 1145) Temp Source: Oral (03/09 1145) BP: 166/79 mmHg (03/09 1145) Pulse Rate: 103 (03/09 1145) Intake/Output from previous day: 03/08 0701 - 03/09 0700 In: 344.7 [I.V.:234.7; IV Piggyback:110] Out: 1741  Intake/Output from this shift: Total I/O In: 480 [P.O.:480] Out: -   Labs:  Recent Labs  08/03/14 0448 08/03/14 0821 08/03/14 1845 08/04/14 0125 08/04/14 0432 08/04/14 0600  WBC 10.1  --  10.7*  --  10.7*  --   HGB 10.5* 14.6 9.5*  --  9.6*  --   PLT 219  --  151  --  166  --   CREATININE 11.78* 6.30* 5.20* 5.88*  --  6.28*   Estimated Creatinine Clearance: 10.7 mL/min (by C-G formula based on Cr of 6.28).  Recent Labs  08/04/14 1000  VANCORANDOM <3.5     Microbiology: Recent Results (from the past 720 hour(s))  MRSA PCR Screening     Status: None   Collection Time: 08/03/14  2:00 PM  Result Value Ref Range Status   MRSA by PCR NEGATIVE NEGATIVE Final    Comment:        The GeneXpert MRSA Assay (FDA approved for NASAL specimens only), is one component of a comprehensive MRSA colonization surveillance program. It is not intended to diagnose MRSA infection nor to guide or monitor treatment for MRSA infections.     Anti-infectives    Start     Dose/Rate Route Frequency Ordered Stop    08/05/14 1200  vancomycin (VANCOCIN) IVPB 750 mg/150 ml premix     750 mg 150 mL/hr over 60 Minutes Intravenous Every T-Th-Sa (Hemodialysis) 08/03/14 0756     08/04/14 1200  ceFEPIme (MAXIPIME) 1 g in dextrose 5 % 50 mL IVPB     1 g 100 mL/hr over 30 Minutes Intravenous  Once 08/04/14 1107     08/03/14 1200  ceFEPIme (MAXIPIME) 2 g in dextrose 5 % 50 mL IVPB     2 g 100 mL/hr over 30 Minutes Intravenous Every T-Th-Sa (Hemodialysis) 08/03/14 0753     08/03/14 0800  vancomycin (VANCOCIN) 1,250 mg in sodium chloride 0.9 % 250 mL IVPB     1,250 mg 166.7 mL/hr over 90 Minutes Intravenous  Once 08/03/14 0755        Assessment: 43yo F presented with SOB on vanc/cefepime for r/o PNA. Patient ESRD on HD TThS.  Both antibiotics were ordered yesterday but patient went to emergent dialysis and the antibiotics were not charted as given. The RN that was taking care of the patient is not here today to validate if the antibiotics were actually given. A vancomycin random level was drawn resulting as <3.5 leading to the conclusion that that patient has not received any since admission. WBC 10.7, afebrile. Planning for HD tomorrow.  Goal of Therapy:  Pre-HD vancomycin  level 20-25mg /L  Plan:  Vancomycin LD 1500mg  IV x 1 now Vancomycin 750mg  IV with each HD session (TThS) Cefepime 1g IV today Cefepime 2g post HD (TThS) Monitor HD schedule for any additional dosing needs F/u clinical progress, cultures, and respiratory virus panel  Thank you for allowing pharmacy to be part of this patient's care team  Moise Friday M. Uday Jantz, Pharm.D Clinical Pharmacy Resident Pager: [phone] 08/04/2014 .1:17 PM

## 2014-08-04 NOTE — Progress Notes (Signed)
Chaplain visited with [name] earlier, she was attempting to get rest and I let her know I would return later.   Stopped by at 4:27 PM and she was sleeping.   Will return tomorrow.   Delford Field, Chaplain 08/04/2014

## 2014-08-04 NOTE — Progress Notes (Signed)
Recheck CBG 54.  Another 8 oz of orange juice provided to patient.  Will recheck again in 15 minutes.

## 2014-08-04 NOTE — Progress Notes (Signed)
[place] TEAM 1 - Stepdown/ICU TEAM Progress Note  [name] IDP:[id number] DOB: [birth year] DOA: 08/03/2014 PCP: [name], MD  Admit HPI / Brief Narrative: 44 y.o. 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 She reported that she began having upper respiratory symptoms about 1 week prior with productive cough of yellow sputum. She reports fever up to 101 in the past 24 hours. She reports poor intake for 2 days. She had nausea and vomiting without diarrhea. She denied any issues related to not taking her insulin or attending dialysis sessions. When EMS arrived to evaluate the patient her O2 sats were 82% on room air the 91% on 4 L nasal cannula. She was placed on high percent nonrebreather mask.  In the ER she was afebrile, her BP was initially elevated at 181/68 but after stabilized blood pressure came down to 135/72. Her respiratory rate was 24. She was in sinus tachycardia. Her O2 sats were 90% on 4 L. An arterial blood gas was obtained that showed a pH of 7.13, CO2 41.8 and PO2 of 57. Her sodium was 120, her potassium greater than 7.5, CO2 was 17 and her anion gap was 19, BUN C9 and creatinine 11.78, serum glucose was 1211. Troponin was 0.00. She had no leukocytosis, hemoglobin was 10.5 with MCV of 107.2 and her platelets were normal. Chest x-ray revealed cardiomegaly, vascular congestion, pulmonary edema consistent with congestive heart failure and increased hazy opacities at the  lung bases concerning for pleural effusions.  HPI/Subjective: Pt c/o some diarrhea related to OJ ("always happens when I drink OJ on an empty stomach"), and a generalized HA.  Denies cp, or current sob.    Assessment/Plan:  DKA Resolved w/ IV insulin and HD - resume home insulin dosing and follow CBG  N/V/ low grade fever  Likely due to DKA - no evidence of current infection - cont contact precautions as pt is at risk for C diff, though she swears diarrhea is due to drinking OJ  Acute respiratory failure with hypoxia - volume overload Corrected w/ HD  Hyperkalemia/End-stage renal disease on hemodialysis K+ corrected w/ HD - Nephrology following   Hypertension Well controlled post HD   Hyperlipidemia  RLS (restless legs syndrome)  History of simultaneous kidney and pancreas transplant Not currently on chronic immunosuppressive therapy  Recent Left pontine CVA Continue full dose aspirin  Code Status: FULL Family Communication: no family present at time of exam Disposition Plan: transfer to renal floor - for HD tomorrow - if CBG stable could be d/c home post HD   Consultants: Nephrology   Procedures: none  Antibiotics: Cefepime 3/9   DVT prophylaxis: SQ heparin   Objective: Blood pressure 127/56, pulse 84, temperature 98.8 F (37.1 C), temperature source Oral, resp. rate 22, height 5\' 1"  (1.549 m), weight 72.031 kg (158 lb 12.8 oz), last menstrual period 08/01/2014, SpO2 100 %.  Intake/Output Summary (Last 24 hours) at 08/04/14 1813 Last data filed at 08/04/14 1237  Gross per 24 hour  Intake 728.86 ml  Output      0  ml  Net 728.86 ml   Exam: General: No acute respiratory distress Lungs: Clear to auscultation bilaterally without wheezes or crackles Cardiovascular: Regular rate and rhythm without murmur gallop or rub normal S1 and S2 Abdomen: Nontender, nondistended, soft, bowel sounds positive, no rebound, no ascites, no appreciable mass Extremities: No  significant cyanosis, clubbing, or edema bilateral lower extremities  Data Reviewed: Basic Metabolic Panel:  Recent Labs Lab 08/03/14 0448 08/03/14 0821 08/03/14 1845 08/04/14 0125 08/04/14 0600  NA 120* 134*  --  132* 138  K >7.5* 4.3  --  6.7* 4.7  CL 84* 96  --  93* 100  CO2 17*  --   --  23 28  GLUCOSE 1211* >700*  --  211* 20*  BUN 69* 38*  --  26* 30*  CREATININE 11.78* 6.30* 5.20* 5.88* 6.28*  CALCIUM 8.2*  --   --  8.4 9.2    Liver Function Tests:  Recent Labs Lab 08/03/14 0448  AST 34  ALT 19  ALKPHOS 178*  BILITOT 1.0  PROT 7.1  ALBUMIN 3.6   CBC:  Recent Labs Lab 08/03/14 0448 08/03/14 0821 08/03/14 1845 08/04/14 0432  WBC 10.1  --  10.7* 10.7*  HGB 10.5* 14.6 9.5* 9.6*  HCT 35.6* 43.0 29.2* 29.4*  MCV 107.2*  --  97.7 98.0  PLT 219  --  151 166   CBG:  Recent Labs Lab 08/04/14 0025 08/04/14 0131 08/04/14 0235 08/04/14 0345 08/04/14 0453  GLUCAP 125* 215* 253* 288* 91    Recent Results (from the past 240 hour(s))  MRSA PCR Screening     Status: None   Collection Time: 08/03/14  2:00 PM  Result Value Ref Range Status   MRSA by PCR NEGATIVE NEGATIVE Final    Comment:        The GeneXpert MRSA Assay (FDA approved for NASAL specimens only), is one component of a comprehensive MRSA colonization surveillance program. It is not intended to diagnose MRSA infection nor to guide or monitor treatment for MRSA infections.      Studies:  Recent x-ray studies have been reviewed in detail by the Attending Physician  Scheduled Meds:  Scheduled Meds: . antiseptic oral rinse  7 mL Mouth Rinse BID  . aspirin  325 mg Oral Daily  . benzonatate  200 mg Oral TID  . calcium acetate  2,001 mg Oral TID WC  . ceFEPime (MAXIPIME) IV  2 g Intravenous Q T,Th,Sa-HD  . [START ON 08/05/2014] darbepoetin (ARANESP) injection - DIALYSIS  40 mcg Intravenous Q Thu-HD  . doxercalciferol  2 mcg Intravenous Q T,Th,Sa-HD  . gabapentin  100 mg Oral BID  .  heparin  5,000 Units Subcutaneous 3 times per day  . insulin aspart  0-5 Units Subcutaneous QHS  . insulin aspart  0-9 Units Subcutaneous TID WC  . insulin glargine  5 Units Subcutaneous BID  . ipratropium-albuterol  3 mL Nebulization TID  . lidocaine  1 patch Transdermal Daily  . metoCLOPramide  5 mg Oral TID AC & HS  . metoprolol succinate  100 mg Oral Daily  . montelukast  10 mg Oral QHS  . multivitamin  1 tablet Oral Daily  . pantoprazole  40 mg Oral QHS  . simvastatin  20 mg Oral QHS  . sodium chloride  3 mL Intravenous Q12H  . vancomycin  1,500 mg Intravenous Once  . [START ON 08/05/2014] vancomycin  750 mg Intravenous Q T,Th,Sa-HD    Time spent on care of  this patient: 75 mins   Harue Pribble T , MD   [facility] Office  [phone] Pager - Text Page per Amion as per below:  On-Call/Text Page:      [email or web address]      password [email or web address]  If 7PM-7AM, please contact night-coverage [email or web address] Password [email or web address] 08/04/2014, 6:13 PM   LOS: 1 day

## 2014-08-04 NOTE — Progress Notes (Signed)
CBG recheck 124.  Patient is stable and is resting comfortably.  Will continue to monitor.

## 2014-08-04 NOTE — Progress Notes (Signed)
CRITICAL VALUE ALERT  Critical value received:  Potassium 6.7  Date of notification:  08/04/2014   Time of notification:  2:28 AM   Critical value read back: yes  Nurse who received alert: Reatha Armour RN  MD notified (1st page):    Time of first page:  2:29 AM   MD notified (2nd page):  Time of second page:  Responding MD:  Forrest Moron  Time MD responded:  [phone]

## 2014-08-04 NOTE — Progress Notes (Signed)
Recheck CBG 53. Patient was provided 3 graham crackers with peanut butter.  Will recheck CBG in 15 minutes.

## 2014-08-04 NOTE — Progress Notes (Signed)
followup cbg 43, additional Dextrose 50 given.  Will reassess

## 2014-08-05 LAB — GLUCOSE, CAPILLARY
GLUCOSE-CAPILLARY: 335 mg/dL — AB (ref 70–99)
Glucose-Capillary: 103 mg/dL — ABNORMAL HIGH (ref 70–99)

## 2014-08-05 LAB — CLOSTRIDIUM DIFFICILE BY PCR: CDIFFPCR: NEGATIVE

## 2014-08-05 MED ORDER — DOXERCALCIFEROL 4 MCG/2ML IV SOLN
INTRAVENOUS | Status: AC
  Filled 2014-08-05: qty 2

## 2014-08-05 MED ORDER — DOXERCALCIFEROL 4 MCG/2ML IV SOLN: 2.0000 ug | INTRAVENOUS | Status: AC

## 2014-08-05 MED ORDER — DARBEPOETIN ALFA 40 MCG/0.4ML IJ SOSY
PREFILLED_SYRINGE | INTRAMUSCULAR | Status: AC
  Filled 2014-08-05: qty 0.4

## 2014-08-05 MED ORDER — RENA-VITE PO TABS: 1.0000 | ORAL_TABLET | Freq: Every day | ORAL | Status: AC

## 2014-08-05 MED ORDER — PANTOPRAZOLE SODIUM 40 MG PO TBEC: 40.0000 mg | DELAYED_RELEASE_TABLET | Freq: Every day | ORAL | Status: AC

## 2014-08-05 MED ORDER — RENA-VITE PO TABS: 1.0000 | ORAL_TABLET | Freq: Every day | ORAL | Status: DC

## 2014-08-05 MED ORDER — METOCLOPRAMIDE HCL 5 MG PO TABS: 5.0000 mg | ORAL_TABLET | Freq: Three times a day (TID) | ORAL | Status: DC

## 2014-08-05 MED ORDER — DARBEPOETIN ALFA 40 MCG/0.4ML IJ SOSY: 40.0000 ug | PREFILLED_SYRINGE | INTRAMUSCULAR | Status: AC

## 2014-08-05 MED ORDER — INSULIN GLARGINE 100 UNIT/ML ~~LOC~~ SOLN: 7.0000 [IU] | Freq: Two times a day (BID) | SUBCUTANEOUS | Status: AC

## 2014-08-05 MED ORDER — INSULIN ASPART 100 UNIT/ML ~~LOC~~ SOLN: 7.0000 [IU] | Freq: Three times a day (TID) | SUBCUTANEOUS | Status: DC

## 2014-08-05 NOTE — Progress Notes (Addendum)
Inpatient Diabetes Program Recommendations  AACE/ADA: New Consensus Statement on Inpatient Glycemic Control (2013)  Target Ranges:  Prepandial:   less than 140 mg/dL      Peak postprandial:   less than 180 mg/dL (1-2 hours)      Critically ill patients:  140 - 180 mg/dL     Results for PRESSLEY, BARSKY (MRN [id number]) as of 08/05/2014 09:08  Ref. Range 08/04/2014 00:25 08/04/2014 01:31 08/04/2014 02:35 08/04/2014 03:45 08/04/2014 04:53 08/04/2014 05:56 08/04/2014 06:19 08/04/2014 06:57 08/04/2014 07:28 08/04/2014 07:56 08/04/2014 08:13 08/04/2014 08:47 08/04/2014 11:49 08/04/2014 16:40 08/04/2014 20:42  Glucose-Capillary Latest Range: 70-99 mg/dL 125 (H) 215 (H) 253 (H) 288 (H) 91 14 (LL) 50 (L) 43 (LL) 34 (LL) 54 (L) 53 (L) 127 (H) 391 (H) 193 (H) 103 (H)     Current Orders: Lantus 6 units bid      Novolog Sensitive SSI tid ac + HS   **Note patient received 10 units Novolog + 50 ml D50% at 2:40 am yesterday for elevated potassium level.  **Patient had severe Hypoglycemia following this hyperkalemic treatment (CBG 14 mg/dl).  **Patient now back on home insulin regimen.  Receiving dialysis today.    Will follow closely Wyn Quaker RN, MSN, CDE Diabetes Coordinator Inpatient Diabetes Program Team Pager: [phone] (8a-5p)

## 2014-08-05 NOTE — Discharge Summary (Signed)
Physician Discharge Summary  [name] ZDG:[id number] DOB: [birth year] DOA: 08/03/2014  PCP: [name], MD  Admit date: 08/03/2014 Discharge date: 08/05/2014  Time spent: 35 minutes  Recommendations for Outpatient Follow-up:  1. Monitor blood sugars and bring to PCP  Discharge Diagnoses:  Active Problems:   Hyperlipidemia   RLS (restless legs syndrome)   History of simultaneous kidney and pancreas transplant   DKA (diabetic ketoacidoses)   Volume overload   End-stage renal disease on hemodialysis   Recent Left pontine CVA   Acute respiratory failure with hypoxia   Fever   Hyperkalemia   HTN (hypertension)   Discharge Condition: improved  Diet recommendation: carb/renal  Filed Weights   08/04/14 2316 08/05/14 0710 08/05/14 1018  Weight: 72.7 kg (160 lb 4.4 oz) 71.8 kg (158 lb 4.6 oz) 68.2 kg (150 lb 5.7 oz)    History of present illness:  [name] is a 44 y.o. 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 She presented to the ER with complaints of shortness of breath associated with cough fever and vomiting. She reported that she began having upper respiratory symptoms about 1 week prior with productive cough of yellow sputum. She reports fever up to 101 in the past 24 hours. She reports poor intake for 2 days. She had nausea and vomiting without diarrhea. She reports exposure to sick contacts. She denies any issues related to not taking her insulin or attending dialysis sessions. When EMS arrived to evaluate the patient her O2 sats rations 82% on room air the 91%  on 4 L nasal cannula. She was placed on high percent nonrebreather mask.  In the ER she was afebrile, her BP was initially elevated at 181/68 but after stabilized blood pressure came down to 135/72. Her respiratory rate was 24. She was in sinus tachycardia. Her O2 sats ration's were 90% on 4 L. An arterial blood gas was obtained that showed a pH of 7.13, CO2 41.8 and PO2 of 57. Her sodium was 120, her potassium is greater than 7.5, CO2 was 17 and her anion gap was 19, BUN C9 and creatinine 11.78, serum glucose was 1211. Troponin was 0.00. He had no leukocytosis, hemoglobin was 10.5 with MCV of 107.2 and her platelets were normal. Chest x-ray revealed cardiomegaly, vascular congestion, pulmonary edema consistent with congestive heart failure and increased hazy opacities at the lung bases concerning for pleural effusions  Hospital Course:  DKA Resolved w/ IV insulin and HD - resume home insulin dosing and follow CBG Patient says she takes 7 BID of the long acting and 7 units of short acting with meals  N/V/ low grade fever  Likely due to DKA - c diff and flu negative  Acute respiratory failure with hypoxia - volume overload Corrected w/ HD  Hyperkalemia/End-stage renal disease on hemodialysis K+ corrected w/ HD - Nephrology following   Hypertension Well controlled post HD   Hyperlipidemia  RLS (restless legs syndrome)  History of simultaneous kidney and pancreas transplant Not currently on chronic immunosuppressive therapy  Recent Left pontine CVA Continue full dose aspirin  Procedures:    Consultations:    Discharge Exam: Filed Vitals:   08/05/14  1018  BP: 140/77  Pulse: 89  Temp: 98.6 F (37 C)  Resp: 15    General: anxious to go home Cardiovascular: rrr Respiratory: no wheezing  Discharge Instructions    Current Discharge Medication List    START taking these medications   Details  Darbepoetin Alfa (ARANESP) 40 MCG/0.4ML SOSY injection Inject 0.4 mLs (40  mcg total) into the vein every Thursday with hemodialysis. Qty: 8.4 mL    doxercalciferol (HECTOROL) 4 MCG/2ML injection Inject 1 mL (2 mcg total) into the vein Every Tuesday,Thursday,and Saturday with dialysis. Qty: 2 mL      CONTINUE these medications which have CHANGED   Details  insulin aspart (NOVOLOG) 100 UNIT/ML injection Inject 7 Units into the skin 3 (three) times daily with meals. Qty: 10 mL, Refills: 0    insulin glargine (LANTUS) 100 UNIT/ML injection Inject 0.07 mLs (7 Units total) into the skin 2 (two) times daily. Qty: 10 mL, Refills: 11    metoCLOPramide (REGLAN) 5 MG tablet Take 1 tablet (5 mg total) by mouth 4 (four) times daily -  before meals and at bedtime. Qty: 120 tablet, Refills: 0    multivitamin (RENA-VIT) TABS tablet Take 1 tablet by mouth daily. Qty: 30 tablet, Refills: 0    pantoprazole (PROTONIX) 40 MG tablet Take 1 tablet (40 mg total) by mouth at bedtime. Qty: 30 tablet, Refills: 0      CONTINUE these medications which have NOT CHANGED   Details  albuterol (PROVENTIL HFA;VENTOLIN HFA) 108 (90 BASE) MCG/ACT inhaler Inhale 2 puffs into the lungs every 4 (four) hours as needed for wheezing or shortness of breath (as per home regimen). Qty: 1 Inhaler, Refills: 0    aspirin 325 MG tablet Take 1 tablet (325 mg total) by mouth daily. Qty: 30 tablet, Refills: 0    calcium acetate (PHOSLO) 667 MG capsule Take 3 capsules (2,001 mg total) by mouth 3 (three) times daily with meals. Qty: 30 capsule, Refills: 0    clonazePAM (KLONOPIN) 0.5 MG tablet Take 0.5 mg by mouth 2 (two) times daily.     gabapentin (NEURONTIN) 100 MG capsule Take 100 mg by mouth 2 (two) times daily.     lidocaine (LIDODERM) 5 % Place 1 patch onto the skin daily. Remove & Discard patch within 12 hours or as directed by MD Qty: 30 patch, Refills: 0    metoprolol succinate (TOPROL-XL) 100 MG 24 hr tablet Take 1 tablet (100 mg total) by mouth daily. Take with or immediately following a  meal. Qty: 30 tablet, Refills: 0    montelukast (SINGULAIR) 10 MG tablet Take 10 mg by mouth at bedtime.    Nutritional Supplements (FEEDING SUPPLEMENT, NEPRO CARB STEADY,) LIQD Take 237 mLs by mouth 2 (two) times daily between meals. Qty: 60 Can, Refills: 0    promethazine (PHENERGAN) 25 MG tablet Take 25 mg by mouth every 6 (six) hours as needed for nausea or vomiting.    simvastatin (ZOCOR) 20 MG tablet Take 1 tablet (20 mg total) by mouth at bedtime. Qty: 30 tablet, Refills: 0      STOP taking these medications     zolpidem (AMBIEN) 5 MG tablet      oxyCODONE-acetaminophen (PERCOCET/ROXICET) 5-325 MG per tablet        Allergies  Allergen Reactions  . Depakote [Divalproex Sodium] Other (See Comments)    Pt gets "delirious"  . Morphine And Related Nausea And Vomiting and Other (See Comments)    "makes me delirious"  .  Penicillins Anaphylaxis    Tolerated Zosyn Dec 2014  . Tramadol Nausea And Vomiting  . Imitrex [Sumatriptan] Other (See Comments)    seizures  . Vicodin [Hydrocodone-Acetaminophen] Itching and Rash      The results of significant diagnostics from this hospitalization (including imaging, microbiology, ancillary and laboratory) are listed below for reference.    Significant Diagnostic Studies: Dg Chest 2 View  08/04/2014   CLINICAL DATA:  Productive cough and shortness of breath. Patient on dialysis. History of failed kidney/pancreas transplant 2004.  EXAM: CHEST  2 VIEW  COMPARISON:  05/17/2014 and 08/03/2014  FINDINGS: Lungs are adequately inflated with mild prominence of the perihilar markings. There is improved bibasilar aeration. No definite effusions. Mild stable cardiomegaly. Remainder of the exam is unchanged.  IMPRESSION: Suggestion of improved mild vascular congestion.   Electronically Signed   By: Marin Olp M.D.   On: 08/04/2014 12:09   Dg Chest Port 1 View  08/03/2014   CLINICAL DATA:  Shortness of breath, fever, vomiting for 1 week.  EXAM:  PORTABLE CHEST - 1 VIEW  COMPARISON:  05/17/2014  FINDINGS: Cardiomegaly is stable. There is vascular congestion and pulmonary edema, this appears similar to prior exam. Suspect small bilateral pleural effusions with hazy opacity at the lung bases. No pneumothorax. No acute osseous abnormalities.  IMPRESSION: 1. Cardiomegaly, vascular congestion, pulmonary edema, consistent with congestive heart failure. This appears similar to prior exam. 2. Increased hazy opacity at the lung bases concerning for pleural effusions.   Electronically Signed   By: Jeb Levering M.D.   On: 08/03/2014 04:10    Microbiology: Recent Results (from the past 240 hour(s))  MRSA PCR Screening     Status: None   Collection Time: 08/03/14  2:00 PM  Result Value Ref Range Status   MRSA by PCR NEGATIVE NEGATIVE Final    Comment:        The GeneXpert MRSA Assay (FDA approved for NASAL specimens only), is one component of a comprehensive MRSA colonization surveillance program. It is not intended to diagnose MRSA infection nor to guide or monitor treatment for MRSA infections.   Culture, blood (routine x 2)     Status: None (Preliminary result)   Collection Time: 08/03/14  6:35 PM  Result Value Ref Range Status   Specimen Description BLOOD LEFT FOOT  Final   Special Requests BOTTLES DRAWN AEROBIC AND ANAEROBIC 10CC  Final   Culture   Final           BLOOD CULTURE RECEIVED NO GROWTH TO DATE CULTURE WILL BE HELD FOR 5 DAYS BEFORE ISSUING A FINAL NEGATIVE REPORT Note: Culture results may be compromised due to an excessive volume of blood received in culture bottles. Performed at [facility]    Report Status PENDING  Incomplete  Culture, blood (routine x 2)     Status: None (Preliminary result)   Collection Time: 08/03/14  6:45 PM  Result Value Ref Range Status   Specimen Description BLOOD RIGHT FOOT  Final   Special Requests BOTTLES DRAWN AEROBIC ONLY 8CC  Final   Culture   Final           BLOOD CULTURE  RECEIVED NO GROWTH TO DATE CULTURE WILL BE HELD FOR 5 DAYS BEFORE ISSUING A FINAL NEGATIVE REPORT Performed at [facility]    Report Status PENDING  Incomplete  Clostridium Difficile by PCR     Status: None   Collection Time: 08/04/14  7:34 PM  Result Value Ref  Range Status   C difficile by pcr NEGATIVE NEGATIVE Final     Labs: Basic Metabolic Panel:  Recent Labs Lab 08/03/14 0448 08/03/14 0821 08/03/14 1845 08/04/14 0125 08/04/14 0600  NA 120* 134*  --  132* 138  K >7.5* 4.3  --  6.7* 4.7  CL 84* 96  --  93* 100  CO2 17*  --   --  23 28  GLUCOSE 1211* >700*  --  211* 20*  BUN 69* 38*  --  26* 30*  CREATININE 11.78* 6.30* 5.20* 5.88* 6.28*  CALCIUM 8.2*  --   --  8.4 9.2   Liver Function Tests:  Recent Labs Lab 08/03/14 0448  AST 34  ALT 19  ALKPHOS 178*  BILITOT 1.0  PROT 7.1  ALBUMIN 3.6   No results for input(s): LIPASE, AMYLASE in the last 168 hours. No results for input(s): AMMONIA in the last 168 hours. CBC:  Recent Labs Lab 08/03/14 0448 08/03/14 0821 08/03/14 1845 08/04/14 0432  WBC 10.1  --  10.7* 10.7*  HGB 10.5* 14.6 9.5* 9.6*  HCT 35.6* 43.0 29.2* 29.4*  MCV 107.2*  --  97.7 98.0  PLT 219  --  151 166   Cardiac Enzymes: No results for input(s): CKTOTAL, CKMB, CKMBINDEX, TROPONINI in the last 168 hours. BNP: BNP (last 3 results) No results for input(s): BNP in the last 8760 hours.  ProBNP (last 3 results)  Recent Labs  01/14/14 0216  PROBNP 17541.0*    CBG:  Recent Labs Lab 08/04/14 1149 08/04/14 1640 08/04/14 2042 08/04/14 2313 08/05/14 1117  GLUCAP 391* 193* 103* 217* 335*       Signed:  Annica Marinello  [facility] 08/05/2014, 12:54 PM

## 2014-08-05 NOTE — Procedures (Signed)
I was present at this dialysis session, have reviewed the session itself and made  appropriate changes  Kelly Splinter MD (pgr) [phone]    ([phone] 08/05/2014, 11:02 AM

## 2014-08-05 NOTE — Care Management Note (Signed)
CARE MANAGEMENT NOTE 08/05/2014  Patient:  [name], [name]   Account Number:  [id number]  Date Initiated:  08/05/2014  Documentation initiated by:  Tisa Weisel  Subjective/Objective Assessment:   CM following for progression and d/c planning.     Action/Plan:   No d/c needs identified.   Anticipated DC Date:  08/05/2014   Anticipated DC Plan:  HOME/SELF CARE         Choice offered to / List presented to:             Status of service:  Completed, signed off Medicare Important Message given?  YES (If response is "NO", the following Medicare IM given date fields will be blank) Date Medicare IM given:  08/05/2014 Medicare IM given by:  Cordarrius Coad Date Additional Medicare IM given:   Additional Medicare IM given by:    Discharge Disposition:  HOME/SELF CARE  Per UR Regulation:    If discussed at Long Length of Stay Meetings, dates discussed:    Comments:

## 2014-08-10 LAB — CULTURE, BLOOD (ROUTINE X 2)
Culture: NO GROWTH
Culture: NO GROWTH

## 2014-08-10 LAB — RESPIRATORY VIRUS PANEL
Adenovirus: NEGATIVE
Influenza A: POSITIVE — AB
Influenza B: NEGATIVE
METAPNEUMOVIRUS: NEGATIVE
Parainfluenza 1: NEGATIVE
Parainfluenza 2: NEGATIVE
Parainfluenza 3: NEGATIVE
RESPIRATORY SYNCYTIAL VIRUS A: NEGATIVE
RHINOVIRUS: NEGATIVE
Respiratory Syncytial Virus B: NEGATIVE

## 2014-11-08 ENCOUNTER — Encounter: Payer: Self-pay | Admitting: Vascular Surgery

## 2014-11-10 ENCOUNTER — Other Ambulatory Visit (HOSPITAL_COMMUNITY): Payer: Self-pay

## 2014-11-10 ENCOUNTER — Emergency Department (HOSPITAL_COMMUNITY): Payer: Medicare Other

## 2014-11-10 ENCOUNTER — Inpatient Hospital Stay (HOSPITAL_COMMUNITY)
Admission: EM | Admit: 2014-11-10 | Discharge: 2014-11-16 | DRG: 637 | Disposition: A | Discharge status: Home / Self Care | Payer: Medicare Other | Attending: Internal Medicine | Admitting: Internal Medicine

## 2014-11-10 ENCOUNTER — Encounter (HOSPITAL_COMMUNITY): Payer: Self-pay

## 2014-11-10 DIAGNOSIS — T8612 Kidney transplant failure: Secondary | ICD-10-CM | POA: Diagnosis present

## 2014-11-10 DIAGNOSIS — Z7982 Long term (current) use of aspirin: Secondary | ICD-10-CM | POA: Diagnosis not present

## 2014-11-10 DIAGNOSIS — E10319 Type 1 diabetes mellitus with unspecified diabetic retinopathy without macular edema: Secondary | ICD-10-CM | POA: Diagnosis present

## 2014-11-10 DIAGNOSIS — T86891 Other transplanted tissue failure: Secondary | ICD-10-CM | POA: Diagnosis present

## 2014-11-10 DIAGNOSIS — G2581 Restless legs syndrome: Secondary | ICD-10-CM | POA: Diagnosis present

## 2014-11-10 DIAGNOSIS — Z9483 Pancreas transplant status: Secondary | ICD-10-CM

## 2014-11-10 DIAGNOSIS — E1022 Type 1 diabetes mellitus with diabetic chronic kidney disease: Secondary | ICD-10-CM | POA: Diagnosis present

## 2014-11-10 DIAGNOSIS — E875 Hyperkalemia: Secondary | ICD-10-CM | POA: Diagnosis present

## 2014-11-10 DIAGNOSIS — E1040 Type 1 diabetes mellitus with diabetic neuropathy, unspecified: Secondary | ICD-10-CM | POA: Diagnosis present

## 2014-11-10 DIAGNOSIS — E1010 Type 1 diabetes mellitus with ketoacidosis without coma: Principal | ICD-10-CM | POA: Diagnosis present

## 2014-11-10 DIAGNOSIS — J9811 Atelectasis: Secondary | ICD-10-CM | POA: Diagnosis present

## 2014-11-10 DIAGNOSIS — F329 Major depressive disorder, single episode, unspecified: Secondary | ICD-10-CM | POA: Diagnosis present

## 2014-11-10 DIAGNOSIS — K224 Dyskinesia of esophagus: Secondary | ICD-10-CM | POA: Diagnosis present

## 2014-11-10 DIAGNOSIS — K3184 Gastroparesis: Secondary | ICD-10-CM | POA: Diagnosis present

## 2014-11-10 DIAGNOSIS — I10 Essential (primary) hypertension: Secondary | ICD-10-CM | POA: Diagnosis not present

## 2014-11-10 DIAGNOSIS — N2581 Secondary hyperparathyroidism of renal origin: Secondary | ICD-10-CM | POA: Diagnosis present

## 2014-11-10 DIAGNOSIS — Z940 Kidney transplant status: Secondary | ICD-10-CM

## 2014-11-10 DIAGNOSIS — I120 Hypertensive chronic kidney disease with stage 5 chronic kidney disease or end stage renal disease: Secondary | ICD-10-CM | POA: Diagnosis present

## 2014-11-10 DIAGNOSIS — E871 Hypo-osmolality and hyponatremia: Secondary | ICD-10-CM | POA: Diagnosis present

## 2014-11-10 DIAGNOSIS — K219 Gastro-esophageal reflux disease without esophagitis: Secondary | ICD-10-CM | POA: Diagnosis present

## 2014-11-10 DIAGNOSIS — Z794 Long term (current) use of insulin: Secondary | ICD-10-CM

## 2014-11-10 DIAGNOSIS — D638 Anemia in other chronic diseases classified elsewhere: Secondary | ICD-10-CM | POA: Diagnosis present

## 2014-11-10 DIAGNOSIS — R1310 Dysphagia, unspecified: Secondary | ICD-10-CM | POA: Diagnosis present

## 2014-11-10 DIAGNOSIS — Z992 Dependence on renal dialysis: Secondary | ICD-10-CM

## 2014-11-10 DIAGNOSIS — E1043 Type 1 diabetes mellitus with diabetic autonomic (poly)neuropathy: Secondary | ICD-10-CM | POA: Diagnosis present

## 2014-11-10 DIAGNOSIS — N186 End stage renal disease: Secondary | ICD-10-CM | POA: Diagnosis present

## 2014-11-10 DIAGNOSIS — E785 Hyperlipidemia, unspecified: Secondary | ICD-10-CM | POA: Diagnosis present

## 2014-11-10 DIAGNOSIS — E876 Hypokalemia: Secondary | ICD-10-CM | POA: Diagnosis not present

## 2014-11-10 DIAGNOSIS — E860 Dehydration: Secondary | ICD-10-CM | POA: Diagnosis present

## 2014-11-10 DIAGNOSIS — Z23 Encounter for immunization: Secondary | ICD-10-CM

## 2014-11-10 LAB — BASIC METABOLIC PANEL
ANION GAP: 33 — AB (ref 5–15)
Anion gap: 29 — ABNORMAL HIGH (ref 5–15)
Anion gap: 22 — ABNORMAL HIGH (ref 5–15)
Anion gap: 19 — ABNORMAL HIGH (ref 5–15)
Anion gap: 17 — ABNORMAL HIGH (ref 5–15)
BUN: 54 mg/dL — AB (ref 6–20)
BUN: 55 mg/dL — ABNORMAL HIGH (ref 6–20)
BUN: 56 mg/dL — AB (ref 6–20)
BUN: 56 mg/dL — AB (ref 6–20)
BUN: 58 mg/dL — ABNORMAL HIGH (ref 6–20)
CALCIUM: 6.8 mg/dL — AB (ref 8.9–10.3)
CALCIUM: 6.2 mg/dL — AB (ref 8.9–10.3)
CALCIUM: 6.5 mg/dL — AB (ref 8.9–10.3)
CALCIUM: 7 mg/dL — AB (ref 8.9–10.3)
CHLORIDE: 89 mmol/L — AB (ref 101–111)
CO2: 6 mmol/L — ABNORMAL LOW (ref 22–32)
CO2: 8 mmol/L — ABNORMAL LOW (ref 22–32)
CO2: 14 mmol/L — ABNORMAL LOW (ref 22–32)
CO2: 16 mmol/L — ABNORMAL LOW (ref 22–32)
CO2: 17 mmol/L — ABNORMAL LOW (ref 22–32)
CREATININE: 7.9 mg/dL — AB (ref 0.44–1.00)
Calcium: 7.3 mg/dL — ABNORMAL LOW (ref 8.9–10.3)
Chloride: 71 mmol/L — ABNORMAL LOW (ref 101–111)
Chloride: 72 mmol/L — ABNORMAL LOW (ref 101–111)
Chloride: 80 mmol/L — ABNORMAL LOW (ref 101–111)
Chloride: 84 mmol/L — ABNORMAL LOW (ref 101–111)
Creatinine, Ser: 8.38 mg/dL — ABNORMAL HIGH (ref 0.44–1.00)
Creatinine, Ser: 8.24 mg/dL — ABNORMAL HIGH (ref 0.44–1.00)
Creatinine, Ser: 8.38 mg/dL — ABNORMAL HIGH (ref 0.44–1.00)
Creatinine, Ser: 8.55 mg/dL — ABNORMAL HIGH (ref 0.44–1.00)
GFR calc Af Amer: 6 mL/min — ABNORMAL LOW (ref >60)
GFR calc non Af Amer: 5 mL/min — ABNORMAL LOW (ref >60)
GFR calc non Af Amer: 5 mL/min — ABNORMAL LOW (ref >60)
GFR calc non Af Amer: 5 mL/min — ABNORMAL LOW (ref >60)
GFR, EST AFRICAN AMERICAN: 7 mL/min — AB (ref >60)
GFR, EST AFRICAN AMERICAN: 6 mL/min — AB (ref >60)
GFR, EST AFRICAN AMERICAN: 6 mL/min — AB (ref >60)
GFR, EST AFRICAN AMERICAN: 6 mL/min — AB (ref >60)
GFR, EST NON AFRICAN AMERICAN: 6 mL/min — AB (ref >60)
GFR, EST NON AFRICAN AMERICAN: 5 mL/min — AB (ref >60)
GLUCOSE: 1638 mg/dL — AB (ref 65–99)
Glucose, Bld: 1659 mg/dL (ref 65–99)
Glucose, Bld: 1125 mg/dL (ref 65–99)
Glucose, Bld: 659 mg/dL (ref 65–99)
Glucose, Bld: 268 mg/dL — ABNORMAL HIGH (ref 65–99)
POTASSIUM: 3.8 mmol/L (ref 3.5–5.1)
POTASSIUM: 3.2 mmol/L — AB (ref 3.5–5.1)
Potassium: >7.5 mmol/L (ref 3.5–5.1)
Potassium: 3.3 mmol/L — ABNORMAL LOW (ref 3.5–5.1)
Sodium: 110 mmol/L — CL (ref 135–145)
Sodium: 109 mmol/L — CL (ref 135–145)
Sodium: 116 mmol/L — CL (ref 135–145)
Sodium: 119 mmol/L — CL (ref 135–145)
Sodium: 123 mmol/L — ABNORMAL LOW (ref 135–145)

## 2014-11-10 LAB — TROPONIN I
TROPONIN I: 0.08 ng/mL — AB (ref <0.031)
Troponin I: 0.19 ng/mL — ABNORMAL HIGH (ref <0.031)

## 2014-11-10 LAB — CBC
HEMATOCRIT: 26.1 % — AB (ref 36.0–46.0)
Hemoglobin: 8.8 g/dL — ABNORMAL LOW (ref 12.0–15.0)
MCH: 32.2 pg (ref 26.0–34.0)
MCHC: 33.7 g/dL (ref 30.0–36.0)
MCV: 95.6 fL (ref 78.0–100.0)
Platelets: 156 10*3/uL (ref 150–400)
RBC: 2.73 MIL/uL — ABNORMAL LOW (ref 3.87–5.11)
RDW: 14.1 % (ref 11.5–15.5)
WBC: 9.7 10*3/uL (ref 4.0–10.5)

## 2014-11-10 LAB — GLUCOSE, CAPILLARY
GLUCOSE-CAPILLARY: 498 mg/dL — AB (ref 65–99)
GLUCOSE-CAPILLARY: 277 mg/dL — AB (ref 65–99)
Glucose-Capillary: >600 mg/dL (ref 65–99)
Glucose-Capillary: 562 mg/dL (ref 65–99)
Glucose-Capillary: 434 mg/dL — ABNORMAL HIGH (ref 65–99)

## 2014-11-10 LAB — MRSA PCR SCREENING: MRSA by PCR: NEGATIVE

## 2014-11-10 LAB — CBC WITH DIFFERENTIAL/PLATELET
Basophils Absolute: 0 10*3/uL (ref 0.0–0.1)
Basophils Relative: 0 % (ref 0–1)
EOS ABS: 0 10*3/uL (ref 0.0–0.7)
Eosinophils Relative: 0 % (ref 0–5)
HEMATOCRIT: 36.7 % (ref 36.0–46.0)
Hemoglobin: 10.6 g/dL — ABNORMAL LOW (ref 12.0–15.0)
LYMPHS ABS: 0.7 10*3/uL (ref 0.7–4.0)
Lymphocytes Relative: 7 % — ABNORMAL LOW (ref 12–46)
MCH: 32.4 pg (ref 26.0–34.0)
MCHC: 28.9 g/dL — AB (ref 30.0–36.0)
MCV: 112.2 fL — AB (ref 78.0–100.0)
MONO ABS: 0.8 10*3/uL (ref 0.1–1.0)
MONOS PCT: 8 % (ref 3–12)
NEUTROS ABS: 8.6 10*3/uL — AB (ref 1.7–7.7)
Neutrophils Relative %: 85 % — ABNORMAL HIGH (ref 43–77)
PLATELETS: 192 10*3/uL (ref 150–400)
RBC: 3.27 MIL/uL — ABNORMAL LOW (ref 3.87–5.11)
RDW: 15.2 % (ref 11.5–15.5)
WBC: 10.1 10*3/uL (ref 4.0–10.5)

## 2014-11-10 LAB — I-STAT VENOUS BLOOD GAS, ED
ACID-BASE DEFICIT: 19 mmol/L — AB (ref 0.0–2.0)
Bicarbonate: 11 mEq/L — ABNORMAL LOW (ref 20.0–24.0)
O2 SAT: 50 %
TCO2: 12 mmol/L (ref 0–100)
pCO2, Ven: 38.3 mmHg — ABNORMAL LOW (ref 45.0–50.0)
pH, Ven: 7.066 — CL (ref 7.250–7.300)
pO2, Ven: 37 mmHg (ref 30.0–45.0)

## 2014-11-10 LAB — MAGNESIUM: MAGNESIUM: 1.9 mg/dL (ref 1.7–2.4)

## 2014-11-10 LAB — CBG MONITORING, ED
Glucose-Capillary: >600 mg/dL (ref 65–99)
Glucose-Capillary: >600 mg/dL (ref 65–99)

## 2014-11-10 LAB — BETA-HYDROXYBUTYRIC ACID: Beta-Hydroxybutyric Acid: 7.93 mmol/L — ABNORMAL HIGH (ref 0.05–0.27)

## 2014-11-10 LAB — PHOSPHORUS: PHOSPHORUS: 10.6 mg/dL — AB (ref 2.5–4.6)

## 2014-11-10 MED ORDER — ASPIRIN 300 MG RE SUPP
300.0000 mg | Freq: Every day | RECTAL | Status: DC
  Administered 2014-11-10: 300 mg via RECTAL
  Filled 2014-11-10 (×3): qty 1

## 2014-11-10 MED ORDER — INSULIN ASPART 100 UNIT/ML IV SOLN
10.0000 [IU] | Freq: Once | INTRAVENOUS | Status: AC
  Administered 2014-11-10: 10 [IU] via INTRAVENOUS
  Filled 2014-11-10: qty 1

## 2014-11-10 MED ORDER — CALCIUM ACETATE (PHOS BINDER) 667 MG PO CAPS
667.0000 mg | ORAL_CAPSULE | Freq: Two times a day (BID) | ORAL | Status: DC
  Administered 2014-11-12 – 2014-11-16 (×6): 667 mg via ORAL
  Filled 2014-11-10 (×13): qty 1

## 2014-11-10 MED ORDER — SODIUM CHLORIDE 0.9 % IV SOLN: INTRAVENOUS | Status: DC

## 2014-11-10 MED ORDER — ACETAMINOPHEN 325 MG PO TABS
650.0000 mg | ORAL_TABLET | Freq: Once | ORAL | Status: AC
  Administered 2014-11-10: 650 mg via ORAL
  Filled 2014-11-10: qty 2

## 2014-11-10 MED ORDER — RENA-VITE PO TABS
1.0000 | ORAL_TABLET | Freq: Every day | ORAL | Status: DC
  Administered 2014-11-11 – 2014-11-15 (×5): 1 via ORAL
  Filled 2014-11-10 (×7): qty 1

## 2014-11-10 MED ORDER — LORAZEPAM 2 MG/ML IJ SOLN
1.0000 mg | Freq: Once | INTRAMUSCULAR | Status: AC
  Administered 2014-11-10: 1 mg via INTRAVENOUS
  Filled 2014-11-10: qty 1

## 2014-11-10 MED ORDER — SODIUM CHLORIDE 0.9 % IV BOLUS (SEPSIS)
500.0000 mL | Freq: Once | INTRAVENOUS | Status: AC
  Administered 2014-11-10: 500 mL via INTRAVENOUS

## 2014-11-10 MED ORDER — SODIUM CHLORIDE 0.9 % IV BOLUS (SEPSIS): 500.0000 mL | Freq: Once | INTRAVENOUS | Status: DC

## 2014-11-10 MED ORDER — SODIUM CHLORIDE 0.9 % IV SOLN: INTRAVENOUS | Status: AC

## 2014-11-10 MED ORDER — HEPARIN SODIUM (PORCINE) 5000 UNIT/ML IJ SOLN
5000.0000 [IU] | Freq: Three times a day (TID) | INTRAMUSCULAR | Status: DC
  Filled 2014-11-10 (×19): qty 1

## 2014-11-10 MED ORDER — SODIUM CHLORIDE 0.9 % IV BOLUS (SEPSIS)
1000.0000 mL | Freq: Once | INTRAVENOUS | Status: AC
  Administered 2014-11-10: 1000 mL via INTRAVENOUS

## 2014-11-10 MED ORDER — METOPROLOL TARTRATE 1 MG/ML IV SOLN
2.5000 mg | Freq: Four times a day (QID) | INTRAVENOUS | Status: DC
  Administered 2014-11-10: 2.5 mg via INTRAVENOUS
  Filled 2014-11-10 (×5): qty 5

## 2014-11-10 MED ORDER — SODIUM CHLORIDE 0.9 % IV SOLN
1.0000 g | Freq: Once | INTRAVENOUS | Status: AC
  Administered 2014-11-10: 1 g via INTRAVENOUS
  Filled 2014-11-10: qty 10

## 2014-11-10 MED ORDER — SODIUM CHLORIDE 0.9 % IV SOLN
INTRAVENOUS | Status: AC
  Administered 2014-11-11: 2.6 [IU]/h via INTRAVENOUS
  Filled 2014-11-10 (×2): qty 2.5

## 2014-11-10 MED ORDER — ONDANSETRON HCL 4 MG/2ML IJ SOLN
4.0000 mg | Freq: Once | INTRAMUSCULAR | Status: AC
  Administered 2014-11-10: 4 mg via INTRAVENOUS
  Filled 2014-11-10: qty 2

## 2014-11-10 MED ORDER — CALCIUM GLUCONATE 10 % IV SOLN
1.0000 g | Freq: Once | INTRAVENOUS | Status: DC
  Filled 2014-11-10: qty 10

## 2014-11-10 MED ORDER — SODIUM CHLORIDE 0.9 % IV SOLN
INTRAVENOUS | Status: DC
  Administered 2014-11-10: 5.4 [IU]/h via INTRAVENOUS
  Filled 2014-11-10: qty 2.5

## 2014-11-10 MED ORDER — PROMETHAZINE HCL 25 MG/ML IJ SOLN
12.5000 mg | Freq: Once | INTRAMUSCULAR | Status: AC
  Administered 2014-11-10: 12.5 mg via INTRAVENOUS
  Filled 2014-11-10: qty 1

## 2014-11-10 MED ORDER — DEXTROSE-NACL 5-0.45 % IV SOLN
INTRAVENOUS | Status: DC
  Administered 2014-11-11: via INTRAVENOUS

## 2014-11-10 MED ORDER — DEXTROSE-NACL 5-0.45 % IV SOLN: INTRAVENOUS | Status: AC

## 2014-11-10 MED ORDER — CETYLPYRIDINIUM CHLORIDE 0.05 % MT LIQD
7.0000 mL | Freq: Two times a day (BID) | OROMUCOSAL | Status: DC
  Administered 2014-11-10 – 2014-11-11 (×2): 7 mL via OROMUCOSAL

## 2014-11-10 MED ORDER — DOXERCALCIFEROL 4 MCG/2ML IV SOLN
3.0000 ug | INTRAVENOUS | Status: DC
  Administered 2014-11-11 – 2014-11-16 (×3): 3 ug via INTRAVENOUS
  Filled 2014-11-10 (×3): qty 2

## 2014-11-10 MED ORDER — CALCIUM ACETATE (PHOS BINDER) 667 MG PO CAPS
2001.0000 mg | ORAL_CAPSULE | Freq: Three times a day (TID) | ORAL | Status: DC
  Administered 2014-11-10 – 2014-11-11 (×2): 2001 mg via ORAL
  Filled 2014-11-10 (×5): qty 3

## 2014-11-10 NOTE — ED Notes (Signed)
Report given to RN on 67M

## 2014-11-10 NOTE — Progress Notes (Signed)
Received call from Dr. Lake Bells that pt will be transferred to IMTS service at [facility] on 11/11/14.  Dr. Naaman Plummer

## 2014-11-10 NOTE — Consult Note (Signed)
Indication for Consultation:  Management of ESRD/hemodialysis; anemia, hypertension/volume and secondary hyperparathyroidism  HPI: [name] is a 44 y.o. female who presented to the ED today for complaints of hyperglycemia. She receives HD [facility], last HD yesterday, history of HTN, DM and multiple hospital admissions for DKA. She reports that since Saturday her home glucose meter had been running high and she began to feel fatigued and nauseous and has been vomiting, she has been unable to tolerate much po intake- however she had 6.4 L removed yesterday in HD. Accucheck Saturday at HD was 335 and on Tuesday was 999. She reports she has been taking lantus and humalog as ordered. She was given phenergan rx for nausea at HD yesterday. She was feeling worse today so she presented to the ED for treatment of hyperglycemia. Will arrange HD while admitted   Past Medical History  Diagnosis Date  . Hyperlipidemia   . Alopecia   . Obesity   . Iron deficiency   . ESRD (end stage renal disease)     S/p pancreatic and kidney transplant, but back on HD 2008, dialysis at [facility], [place], Sat  . RLS (restless legs syndrome)   . Injury of conjunctiva and corneal abrasion of right eye without foreign body   . Cellulitis   . Anemia   . Blood transfusion 2004    "when I had my transplant"  . Migraine   . Hyperparathyroidism, secondary   . Diabetic retinopathy   . Hypertension     takes Metoprolol and Exforge daily  . Depression     takes Klonopin nightly  . Diabetes mellitus type 1     Pt states diagnosed at age 74 with prior episodes of DKA. S/p pancreatic transplant   . GERD (gastroesophageal reflux disease)     takes Protonix daily  . Bronchitis   . Migraine   . Asthma   . Emphysema of lung   . Angina     none in past 2 years.  . Stomach pain     Hx: of chronic  . Pneumonia 2012    "double"  . Dialysis patient     tues,thurs,sat  . 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  Surgery was done at [facility].      Past Surgical History  Procedure Laterality Date  . 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 Thyroglobulin      x 7  . Av fistula placement      right upper arm  . Cholecystectomy  1995  .  hd graft placement/removal      "had 2 in my left upper arm"  . Eye surgery    . Retinopathy surgery    . Tooth extraction  10/10/11    X's two  . Insertion of dialysis catheter  12/08/2011    Procedure: INSERTION OF DIALYSIS CATHETER;  Surgeon: Angelia Mould, MD;  Location: [place];  Service: Vascular;  Laterality: Left;  . Av fistula placement  01/22/2012    Procedure: INSERTION OF ARTERIOVENOUS (AV) GORE-TEX GRAFT ARM;  Surgeon: Angelia Mould, MD;  Location: [place];  Service: Vascular;  Laterality: Left;  Ultrasound guided.  . Av fistula placement  03/14/2012    Procedure: INSERTION  OF ARTERIOVENOUS (AV) GORE-TEX GRAFT THIGH;  Surgeon: Angelia Mould, MD;  Location: [place];  Service: Vascular;  Laterality: Right;  . False aneurysm repair Right 01/02/2013    Procedure: Excision of lymphocele in right thigh.;  Surgeon: Angelia Mould, MD;  Location: [place];  Service: Vascular;  Laterality: Right;  . Carpal tunnel release Right 03/02/2013    Procedure: CARPAL TUNNEL RELEASE;  Surgeon: Tennis Must, MD;  Location: [place];  Service: Orthopedics;  Laterality: Right;  . Flexible sigmoidoscopy N/A 03/24/2013    Procedure: FLEXIBLE SIGMOIDOSCOPY;  Surgeon: Cleotis Nipper, MD;  Location[facility] ENDOSCOPY;  Service: Endoscopy;  Laterality: N/A;  . Revision of arteriovenous goretex graft Right 04/02/2013    Procedure: REPAIR OF PSEUDOANEURYSM OF ARTERIOVENOUS GORETEX GRAFT;  Surgeon: Angelia Mould, MD;  Location: [place];  Service: Vascular;  Laterality: Right;  . Carpal tunnel release Left 10/16/2013    Procedure: LEFT CARPAL TUNNEL RELEASE;  Surgeon: Tennis Must, MD;  Location: [place];  Service: Orthopedics;  Laterality: Left;  . Unilateral upper extremeity angiogram N/A 11/12/2011    Procedure: UNILATERAL UPPER Anselmo Rod;  Surgeon: Angelia Mould, MD;  Location: [facility] CATH LAB;  Service: Cardiovascular;  Laterality: N/A;  . Fistulogram Left 03/03/2012    Procedure: FISTULOGRAM;  Surgeon: Angelia Mould, MD;  Location: [facility] CATH LAB;  Service: Cardiovascular;  Laterality: Left;   Family History  Problem Relation Age of Onset  . Hypertension Mother   . Kidney disease Mother   . Diabetes Mother   . Hypertension Father   . Kidney disease Father   . Diabetes Father    Social History:  reports that she has never smoked. She has never used smokeless tobacco. She reports that she does not drink alcohol or use illicit drugs. Allergies  Allergen Reactions  . Depakote [Divalproex Sodium] Other (See Comments)    Pt gets "delirious"  . Morphine And Related Nausea And Vomiting and Other (See Comments)    "makes me delirious"  . Penicillins Anaphylaxis    Tolerated Zosyn Dec 2014  . Tramadol Nausea And Vomiting  . Imitrex [Sumatriptan] Other (See Comments)    seizures  . Vicodin [Hydrocodone-Acetaminophen] Itching and Rash   Prior to Admission medications   Medication Sig Start Date End Date Taking? Authorizing Provider  albuterol (PROVENTIL HFA;VENTOLIN HFA) 108 (90 BASE) MCG/ACT inhaler Inhale 2 puffs into the lungs every 4 (four) hours as needed for wheezing or shortness of breath (as per home regimen). 02/20/13  Yes Kinnie Feil, MD  aspirin 81 MG chewable tablet Chew 81 mg by mouth daily as needed for mild pain.   Yes Historical Provider, MD  calcium acetate (PHOSLO) 667 MG capsule Take 3 capsules (2,001 mg total) by mouth 3 (three)  times daily with meals. 05/20/14  Yes Velvet Bathe, MD  clonazePAM (KLONOPIN) 0.5 MG tablet Take 0.5 mg by mouth 2 (two) times daily.    Yes Historical Provider, MD  Darbepoetin Alfa (ARANESP) 40 MCG/0.4ML SOSY injection Inject 0.4 mLs (40 mcg total) into the vein every Thursday with hemodialysis. 08/05/14  Yes Geradine Girt, DO  doxercalciferol (HECTOROL) 4 MCG/2ML injection Inject 1 mL (2 mcg total) into the vein Every Tuesday,Thursday,and Saturday with dialysis. 08/05/14  Yes Geradine Girt, DO  gabapentin (NEURONTIN) 100 MG capsule Take 100 mg by mouth 2 (two) times daily.    Yes Historical Provider, MD  insulin aspart (NOVOLOG) 100 UNIT/ML injection  Inject 7 Units into the skin 3 (three) times daily with meals. 08/05/14  Yes Jessica U Vann, DO  insulin glargine (LANTUS) 100 UNIT/ML injection Inject 0.07 mLs (7 Units total) into the skin 2 (two) times daily. 08/05/14  Yes Jessica U Vann, DO  lidocaine (LIDODERM) 5 % Place 1 patch onto the skin daily. Remove & Discard patch within 12 hours or as directed by MD 05/04/14  Yes Liam Graham, PA-C  metoCLOPramide (REGLAN) 5 MG tablet Take 1 tablet (5 mg total) by mouth 4 (four) times daily -  before meals and at bedtime. 08/05/14  Yes Geradine Girt, DO  metoprolol succinate (TOPROL-XL) 100 MG 24 hr tablet Take 1 tablet (100 mg total) by mouth daily. Take with or immediately following a meal. 05/20/14  Yes Velvet Bathe, MD  montelukast (SINGULAIR) 10 MG tablet Take 10 mg by mouth at bedtime.   Yes Historical Provider, MD  multivitamin (RENA-VIT) TABS tablet Take 1 tablet by mouth daily. 08/05/14  Yes Geradine Girt, DO  Nutritional Supplements (FEEDING SUPPLEMENT, NEPRO CARB STEADY,) LIQD Take 237 mLs by mouth 2 (two) times daily between meals. 04/23/14  Yes Orson Eva, MD  pantoprazole (PROTONIX) 40 MG tablet Take 1 tablet (40 mg total) by mouth at bedtime. 08/05/14  Yes Geradine Girt, DO  promethazine (PHENERGAN) 25 MG tablet Take 25 mg by mouth every 6  (six) hours as needed for nausea or vomiting.   Yes Historical Provider, MD  simvastatin (ZOCOR) 20 MG tablet Take 1 tablet (20 mg total) by mouth at bedtime. 05/20/14  Yes Velvet Bathe, MD  zolpidem (AMBIEN) 5 MG tablet Take 5 mg by mouth at bedtime as needed. sleep 02/09/14  Yes Historical Provider, MD  aspirin 325 MG tablet Take 1 tablet (325 mg total) by mouth daily. Patient not taking: Reported on 11/10/2014 05/20/14   Velvet Bathe, MD   Current Facility-Administered Medications  Medication Dose Route Frequency Provider Last Rate Last Dose  . dextrose 5 %-0.45 % sodium chloride infusion   Intravenous Continuous Sherwood Gambler, MD      . insulin regular (NOVOLIN R,HUMULIN R) 250 Units in sodium chloride 0.9 % 250 mL (1 Units/mL) infusion   Intravenous Continuous Sherwood Gambler, MD 16.2 mL/hr at 11/10/14 1335 16.2 Units/hr at 11/10/14 1335  . promethazine (PHENERGAN) injection 12.5 mg  12.5 mg Intravenous Once Erick Colace, NP      . sodium chloride 0.9 % bolus 500 mL  500 mL Intravenous Once Erick Colace, NP 1,000 mL/hr at 11/10/14 1359 500 mL at 11/10/14 1359   Current Outpatient Prescriptions  Medication Sig Dispense Refill  . albuterol (PROVENTIL HFA;VENTOLIN HFA) 108 (90 BASE) MCG/ACT inhaler Inhale 2 puffs into the lungs every 4 (four) hours as needed for wheezing or shortness of breath (as per home regimen). 1 Inhaler 0  . aspirin 81 MG chewable tablet Chew 81 mg by mouth daily as needed for mild pain.    . calcium acetate (PHOSLO) 667 MG capsule Take 3 capsules (2,001 mg total) by mouth 3 (three) times daily with meals. 30 capsule 0  . clonazePAM (KLONOPIN) 0.5 MG tablet Take 0.5 mg by mouth 2 (two) times daily.     . Darbepoetin Alfa (ARANESP) 40 MCG/0.4ML SOSY injection Inject 0.4 mLs (40 mcg total) into the vein every Thursday with hemodialysis. 8.4 mL   . doxercalciferol (HECTOROL) 4 MCG/2ML injection Inject 1 mL (2 mcg total) into the vein Every Tuesday,Thursday,and Saturday  with dialysis.  2 mL   . gabapentin (NEURONTIN) 100 MG capsule Take 100 mg by mouth 2 (two) times daily.     . insulin aspart (NOVOLOG) 100 UNIT/ML injection Inject 7 Units into the skin 3 (three) times daily with meals. 10 mL 0  . insulin glargine (LANTUS) 100 UNIT/ML injection Inject 0.07 mLs (7 Units total) into the skin 2 (two) times daily. 10 mL 11  . lidocaine (LIDODERM) 5 % Place 1 patch onto the skin daily. Remove & Discard patch within 12 hours or as directed by MD 30 patch 0  . metoCLOPramide (REGLAN) 5 MG tablet Take 1 tablet (5 mg total) by mouth 4 (four) times daily -  before meals and at bedtime. 120 tablet 0  . metoprolol succinate (TOPROL-XL) 100 MG 24 hr tablet Take 1 tablet (100 mg total) by mouth daily. Take with or immediately following a meal. 30 tablet 0  . montelukast (SINGULAIR) 10 MG tablet Take 10 mg by mouth at bedtime.    . multivitamin (RENA-VIT) TABS tablet Take 1 tablet by mouth daily. 30 tablet 0  . Nutritional Supplements (FEEDING SUPPLEMENT, NEPRO CARB STEADY,) LIQD Take 237 mLs by mouth 2 (two) times daily between meals. 60 Can 0  . pantoprazole (PROTONIX) 40 MG tablet Take 1 tablet (40 mg total) by mouth at bedtime. 30 tablet 0  . promethazine (PHENERGAN) 25 MG tablet Take 25 mg by mouth every 6 (six) hours as needed for nausea or vomiting.    . simvastatin (ZOCOR) 20 MG tablet Take 1 tablet (20 mg total) by mouth at bedtime. 30 tablet 0  . zolpidem (AMBIEN) 5 MG tablet Take 5 mg by mouth at bedtime as needed. sleep    . aspirin 325 MG tablet Take 1 tablet (325 mg total) by mouth daily. (Patient not taking: Reported on 11/10/2014) 30 tablet 0  . [DISCONTINUED] NORVASC 10 MG tablet Take 1 tablet by mouth Daily.     Labs: Basic Metabolic Panel:  Recent Labs Lab 11/10/14 0908 11/10/14 1055  NA 110* 109*  K >7.5* >7.5*  CL 71* 72*  CO2 6* 8*  GLUCOSE 1659* 1638*  BUN 54* 55*  CREATININE 8.38* 7.90*  CALCIUM 6.8* 6.2*  PHOS  --  10.6*   Liver Function  Tests: No results for input(s): AST, ALT, ALKPHOS, BILITOT, PROT, ALBUMIN in the last 168 hours. No results for input(s): LIPASE, AMYLASE in the last 168 hours. No results for input(s): AMMONIA in the last 168 hours. CBC:  Recent Labs Lab 11/10/14 0908  WBC 10.1  NEUTROABS 8.6*  HGB 10.6*  HCT 36.7  MCV 112.2*  PLT 192   Cardiac Enzymes: No results for input(s): CKTOTAL, CKMB, CKMBINDEX, TROPONINI in the last 168 hours. CBG:  Recent Labs Lab 11/10/14 0829 11/10/14 1219 11/10/14 1329  GLUCAP >600* >600* >600*   Iron Studies: No results for input(s): IRON, TIBC, TRANSFERRIN, FERRITIN in the last 72 hours. Studies/Results: Dg Chest Port 1 View  11/10/2014   CLINICAL DATA:  Dyspnea  EXAM: PORTABLE CHEST - 1 VIEW  COMPARISON:  08/04/2014  FINDINGS: Chronic cardiomegaly, accentuated by hypoventilation and portable technique. Stable aortic and hilar contours, distorted from technique.  Interstitial and hazy density crowding at the bases without definite pneumonia or edema. No effusion or pneumothorax.  IMPRESSION: 1. Bibasilar opacity is likely atelectasis given hypoventilation. No edema or definitive pneumonia. 2. Chronic cardiomegaly.   Electronically Signed   By: Monte Fantasia M.D.   On: 11/10/2014 10:06    Review  of Systems: Gen: Increased fatigue/weakness CV: Reports substernal chest pain- nonradiating.  Resp: Reports dyspnea with exercise, cough, sputum GI: Reports nausea and vomiting  GU :anuric.  Psych: Denies depression, anxiety, memory loss, suicidal ideation, hallucinations, paranoia, and confusion. Denies stress- but per HD staff is having trouble finding place to live Endocrine  DM- glucose high since saturday  Physical Exam: Filed Vitals:   11/10/14 1315 11/10/14 1330 11/10/14 1345 11/10/14 1400  BP: 115/56 120/56 125/64 118/58  Pulse: 81 81 82 81  Temp:      TempSrc:      Resp: 18 20 17 18   SpO2: 99% 100% 100% 100%     General: Well developed, well  nourished, in no acute distress. Head: Normocephalic, atraumatic, sclera non-icteric, mucus membranes are moist Neck: Supple. JVD not elevated. Lungs: Clear bilaterally to auscultation without wheezes, rales, or rhonchi. Breathing is unlabored. Heart: RRR with S1 S2. No murmurs, rubs, or gallops appreciated. Abdomen: Soft, non-tender, non-distended with normoactive bowel sounds. No rebound/guarding. No obvious abdominal masses. M-S:  Strength and tone appear normal for age. Lower extremities- trace LE edema Neuro: Alert and oriented X 3. Moves all extremities spontaneously. Psych:  Responds to questions appropriately with a normal affect. Dialysis Access: R thigh AVG +b/t  Dialysis Orders:  TTS [facility] 3hr 45 min   69kgs  2K/2.5Ca+    No heparin   R thigh AVG hectorol 3 Micera 100 q 2 weeks last dose given 6/9  Assessment/Plan: 1.  DKA- glucose 1638 in ED. Insulin gtt started. Admitting to ICU/ NS bolus given  2.  ESRD -  TTS [facility], hyperkalemia with DKA, recheck K+/ use 1 K bath and watch labs  3.  Hypertension/volume  - 118/58, cont home meds- amlodipine and toprol. Left under edw at 68.4 kgs yesterday with 6.4 L removed.  4.  Anemia  - baseline hgb around 10. Last micera given 6/9 - next dose due 6/23- watch CBC and start ESA prn. No Fe- last tsat 51 5.  Metabolic bone disease -  Ca+ 6.2 phos 10.6. Last PTH 8- hectorol was decreased to 31mcg.- cont phoslo- last oupt ca 8.3 on 6/9 6.  Nutrition - renal diet when advanced. multivit 7. Prolonged bleeding from AVG- consult [facility]- has appt pending 6/16 - last access flow 1743 and last intervention 4/20 at [facility] with Dr Augustin Coupe  Shelle Iron, NP [facility] [phone] 11/10/2014, 2:18 PM   Pt seen, examined and agree w A/P as above. ESRD patient w recurrent DKA. No fever or exam findings to suggest infection or sepsis. Will check troponin. Insulin gtt per primary, does not need fluids unless hypotensive in this setting. Will follow,  plan HD tomorrow.  Kelly Splinter MD pager [phone]    cell [phone] 11/10/2014, 3:25 PM

## 2014-11-10 NOTE — ED Notes (Signed)
Phlebotomy at bedside attempting to obtain blood for labs.

## 2014-11-10 NOTE — ED Notes (Signed)
Attempted report 

## 2014-11-10 NOTE — ED Notes (Signed)
Pt to go to ICU, ED resident to contact [place]

## 2014-11-10 NOTE — Procedures (Signed)
Arterial Catheter Insertion Procedure Note [name] [id number] 1970-11-18  Procedure: Insertion of Arterial Catheter  Indications: Blood pressure monitoring and Frequent blood sampling  Procedure Details Consent: Risks of procedure as well as the alternatives and risks of each were explained to the (patient/caregiver).  Consent for procedure obtained. Time Out: Verified patient identification, verified procedure, site/side was marked, verified correct patient position, special equipment/implants available, medications/allergies/relevent history reviewed, required imaging and test results available.  Performed  Maximum sterile technique was used including antiseptics, cap, gloves, gown, hand hygiene, mask and sheet. Skin prep: Chlorhexidine; local anesthetic administered 20 gauge catheter was inserted into left radial artery using the Seldinger technique.  Evaluation Blood flow good; BP tracing good. Complications: No apparent complications.   [name] 11/10/2014

## 2014-11-10 NOTE — ED Provider Notes (Signed)
CSN: [id number]     Arrival date & time 11/10/14  0865 History   First MD Initiated Contact with Patient 11/10/14 [phone]     Chief Complaint  Patient presents with  . Hyperglycemia   HPI Ms. Gingerich is a 44yo woman with PMHx of type 1 DM w/ prior episodes of DKA, ESRD on HD, and HTN who presents to the ED with hyperglycemia. Patient reports since Saturday (11/06/14) her blood sugars have been reading "HIGH" on her meter. She states she knows she is in DKA. She reports she has been taking Lantus 7 units QHS and Humalog 6 units the morning and 5 units at night without any improvement in her blood sugars. She notes associated abdominal pain, nausea, and vomiting since Saturday. She has not been able to keep any food/liquids down. She also notes substernal chest pain that started yesterday. She describes the pain as 8/10 in severity, achy, non-radiating, and associated with SOB. Her last dialysis session was yesterday (on T/Th/Sat schedule). Patient was just hospitalized from 3/8-3/10 for DKA. She was discharged on a different insulin regimen at that time, including Lantus 7 units QHS and Humalog 7 units TID. She reports her Humalog regimen was changed by her endocrinologist in [place] as she was experiencing low blood sugar. She denies any illness or infections prior to the onset of her symptoms.    Past Medical History  Diagnosis Date  . Hyperlipidemia   . Alopecia   . Obesity   . Iron deficiency   . ESRD (end stage renal disease)     S/p pancreatic and kidney transplant, but back on HD 2008, dialysis at [facility], [place], Sat  . RLS (restless legs syndrome)   . Injury of conjunctiva and corneal abrasion of right eye without foreign body   . Cellulitis   . Anemia   . Blood transfusion 2004    "when I had my transplant"  . Migraine   . Hyperparathyroidism, secondary   . Diabetic retinopathy   . Hypertension     takes Metoprolol and Exforge daily  . Depression     takes Klonopin nightly   . Diabetes mellitus type 1     Pt states diagnosed at age 50 with prior episodes of DKA. S/p pancreatic transplant   . GERD (gastroesophageal reflux disease)     takes Protonix daily  . Bronchitis   . Migraine   . Asthma   . Emphysema of lung   . Angina     none in past 2 years.  . Stomach pain     Hx: of chronic  . Pneumonia 2012    "double"  . Dialysis patient     tues,thurs,sat  . 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  Surgery was done at [facility].      Past Surgical History  Procedure Laterality Date  . 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 Thyroglobulin      x 7  . Av fistula placement      right upper arm  . Cholecystectomy  1995  .  hd graft placement/removal      "had 2 in my left upper arm"  . Eye surgery    . Retinopathy surgery    . Tooth  extraction  10/10/11    X's two  . Insertion of dialysis catheter  12/08/2011    Procedure: INSERTION OF DIALYSIS CATHETER;  Surgeon: Angelia Mould, MD;  Location: [place];  Service: Vascular;  Laterality: Left;  . Av fistula placement  01/22/2012    Procedure: INSERTION OF ARTERIOVENOUS (AV) GORE-TEX GRAFT ARM;  Surgeon: Angelia Mould, MD;  Location: [place];  Service: Vascular;  Laterality: Left;  Ultrasound guided.  . Av fistula placement  03/14/2012    Procedure: INSERTION OF ARTERIOVENOUS (AV) GORE-TEX GRAFT THIGH;  Surgeon: Angelia Mould, MD;  Location: [place];  Service: Vascular;  Laterality: Right;  . False aneurysm repair Right 01/02/2013    Procedure: Excision of lymphocele in right thigh.;  Surgeon: Angelia Mould, MD;  Location: [place];  Service: Vascular;  Laterality: Right;  . Carpal tunnel release Right 03/02/2013    Procedure: CARPAL TUNNEL RELEASE;   Surgeon: Tennis Must, MD;  Location: [place];  Service: Orthopedics;  Laterality: Right;  . Flexible sigmoidoscopy N/A 03/24/2013    Procedure: FLEXIBLE SIGMOIDOSCOPY;  Surgeon: Cleotis Nipper, MD;  Location: [facility] ENDOSCOPY;  Service: Endoscopy;  Laterality: N/A;  . Revision of arteriovenous goretex graft Right 04/02/2013    Procedure: REPAIR OF PSEUDOANEURYSM OF ARTERIOVENOUS GORETEX GRAFT;  Surgeon: Angelia Mould, MD;  Location: [place];  Service: Vascular;  Laterality: Right;  . Carpal tunnel release Left 10/16/2013    Procedure: LEFT CARPAL TUNNEL RELEASE;  Surgeon: Tennis Must, MD;  Location: [place];  Service: Orthopedics;  Laterality: Left;  . Unilateral upper extremeity angiogram N/A 11/12/2011    Procedure: UNILATERAL UPPER Anselmo Rod;  Surgeon: Angelia Mould, MD;  Location: [facility] CATH LAB;  Service: Cardiovascular;  Laterality: N/A;  . Fistulogram Left 03/03/2012    Procedure: FISTULOGRAM;  Surgeon: Angelia Mould, MD;  Location: [facility] CATH LAB;  Service: Cardiovascular;  Laterality: Left;   Family History  Problem Relation Age of Onset  . Hypertension Mother   . Kidney disease Mother   . Diabetes Mother   . Hypertension Father   . Kidney disease Father   . Diabetes Father    History  Substance Use Topics  . Smoking status: Never Smoker   . Smokeless tobacco: Never Used  . Alcohol Use: No   OB History    No data available     Review of Systems General: Denies fever, chills, night sweats, changes in weight HEENT: Reports headache. Denies ear pain, changes in vision, rhinorrhea, sore throat CV: Denies palpitations, orthopnea Pulm: Denies cough, wheezing GI: Denies diarrhea, constipation, melena, hematochezia GU: Denies dysuria, hematuria, frequency Msk: Denies muscle cramps, joint pains Neuro: Denies weakness, numbness, tingling Skin: Denies rashes, bruising   Allergies  Depakote; Morphine and related;  Penicillins; Tramadol; Imitrex; and Vicodin  Home Medications   Prior to Admission medications   Medication Sig Start Date End Date Taking? Authorizing Provider  albuterol (PROVENTIL HFA;VENTOLIN HFA) 108 (90 BASE) MCG/ACT inhaler Inhale 2 puffs into the lungs every 4 (four) hours as needed for wheezing or shortness of breath (as per home regimen). 02/20/13   Kinnie Feil, MD  aspirin 325 MG tablet Take 1 tablet (325 mg total) by mouth daily. 05/20/14   Velvet Bathe, MD  calcium acetate (PHOSLO) 667 MG capsule Take 3 capsules (2,001 mg total) by mouth 3 (three) times daily with meals. 05/20/14   Velvet Bathe, MD  clonazePAM (KLONOPIN) 0.5 MG tablet Take  0.5 mg by mouth 2 (two) times daily.     Historical Provider, MD  Darbepoetin Alfa (ARANESP) 40 MCG/0.4ML SOSY injection Inject 0.4 mLs (40 mcg total) into the vein every Thursday with hemodialysis. 08/05/14   Geradine Girt, DO  doxercalciferol (HECTOROL) 4 MCG/2ML injection Inject 1 mL (2 mcg total) into the vein Every Tuesday,Thursday,and Saturday with dialysis. 08/05/14   Geradine Girt, DO  gabapentin (NEURONTIN) 100 MG capsule Take 100 mg by mouth 2 (two) times daily.     Historical Provider, MD  insulin aspart (NOVOLOG) 100 UNIT/ML injection Inject 7 Units into the skin 3 (three) times daily with meals. 08/05/14   Geradine Girt, DO  insulin glargine (LANTUS) 100 UNIT/ML injection Inject 0.07 mLs (7 Units total) into the skin 2 (two) times daily. 08/05/14   Geradine Girt, DO  lidocaine (LIDODERM) 5 % Place 1 patch onto the skin daily. Remove & Discard patch within 12 hours or as directed by MD 05/04/14   Liam Graham, PA-C  metoCLOPramide (REGLAN) 5 MG tablet Take 1 tablet (5 mg total) by mouth 4 (four) times daily -  before meals and at bedtime. 08/05/14   Geradine Girt, DO  metoprolol succinate (TOPROL-XL) 100 MG 24 hr tablet Take 1 tablet (100 mg total) by mouth daily. Take with or immediately following a meal. 05/20/14   Velvet Bathe, MD   montelukast (SINGULAIR) 10 MG tablet Take 10 mg by mouth at bedtime.    Historical Provider, MD  multivitamin (RENA-VIT) TABS tablet Take 1 tablet by mouth daily. 08/05/14   Geradine Girt, DO  Nutritional Supplements (FEEDING SUPPLEMENT, NEPRO CARB STEADY,) LIQD Take 237 mLs by mouth 2 (two) times daily between meals. 04/23/14   Orson Eva, MD  pantoprazole (PROTONIX) 40 MG tablet Take 1 tablet (40 mg total) by mouth at bedtime. 08/05/14   Geradine Girt, DO  promethazine (PHENERGAN) 25 MG tablet Take 25 mg by mouth every 6 (six) hours as needed for nausea or vomiting.    Historical Provider, MD  simvastatin (ZOCOR) 20 MG tablet Take 1 tablet (20 mg total) by mouth at bedtime. 05/20/14   Velvet Bathe, MD   BP 126/56 mmHg  Pulse 84  Temp(Src) 98 F (36.7 C) (Oral)  Resp 16  SpO2 98% Physical Exam General: middle aged woman sitting up in bed, NAD HEENT: [place]/AT, EOMI, sclera anicteric, mucus membranes dry Chest: RRR, no m/g/r. Midsternal pain reproducible on exam.  Pulm: CTA bilaterally, breaths non-labored on 2 L oxygen via [place] Abd: BS+, soft, non-distended, non-tender Ext: warm, no edema Neuro: alert and oriented x 3, no focal deficits  ED Course  Procedures (including critical care time) Labs Review Labs Reviewed  BASIC METABOLIC PANEL - Abnormal; Notable for the following:    Sodium 110 (*)    Potassium >7.5 (*)    Chloride 71 (*)    CO2 6 (*)    Glucose, Bld 1659 (*)    BUN 54 (*)    Creatinine, Ser 8.38 (*)    Calcium 6.8 (*)    GFR calc non Af Amer 5 (*)    GFR calc Af Amer 6 (*)    Anion gap 33 (*)    All other components within normal limits  CBC WITH DIFFERENTIAL/PLATELET - Abnormal; Notable for the following:    RBC 3.27 (*)    Hemoglobin 10.6 (*)    MCV 112.2 (*)    MCHC 28.9 (*)    Neutrophils  Relative % 85 (*)    Lymphocytes Relative 7 (*)    Neutro Abs 8.6 (*)    All other components within normal limits  BETA-HYDROXYBUTYRIC ACID - Abnormal; Notable for the  following:    Beta-Hydroxybutyric Acid 7.93 (*)    All other components within normal limits  I-STAT VENOUS BLOOD GAS, ED - Abnormal; Notable for the following:    pH, Ven 7.066 (*)    pCO2, Ven 38.3 (*)    Bicarbonate 11.0 (*)    Acid-base deficit 19.0 (*)    All other components within normal limits  GLUCOSE, RANDOM  BASIC METABOLIC PANEL  MAGNESIUM  PHOSPHORUS  CBG MONITORING, ED    Imaging Review Dg Chest Port 1 View  11/10/2014   CLINICAL DATA:  Dyspnea  EXAM: PORTABLE CHEST - 1 VIEW  COMPARISON:  08/04/2014  FINDINGS: Chronic cardiomegaly, accentuated by hypoventilation and portable technique. Stable aortic and hilar contours, distorted from technique.  Interstitial and hazy density crowding at the bases without definite pneumonia or edema. No effusion or pneumothorax.  IMPRESSION: 1. Bibasilar opacity is likely atelectasis given hypoventilation. No edema or definitive pneumonia. 2. Chronic cardiomegaly.   Electronically Signed   By: Monte Fantasia M.D.   On: 11/10/2014 10:06     EKG Interpretation   Date/Time:  Wednesday November 10 2014 08:49:32 EDT Ventricular Rate:  80 PR Interval:  243 QRS Duration: 118 QT Interval:  451 QTC Calculation: 520 R Axis:   109 Text Interpretation:  Sinus rhythm Prolonged PR interval Left atrial  enlargement Nonspecific intraventricular conduction delay Probable  inferior infarct, age indeterminate no significant change since Mar 2016  Confirmed by Regenia Skeeter  MD, [place] ([id number]) on 11/10/2014 8:58:12 AM      MDM   Final diagnoses:  Type 1 diabetes mellitus with ketoacidosis and without coma   44yo woman with hx of type 1 DM and multiple admissions for DKA presenting with a 5 day hx of "HIGH" blood sugars at home with associated abdominal pain, nausea, and vomiting concerning for DKA. Will start with NS 500 cc bolus given she is ESRD on HD. Also get CBG, bmet, ABG, serum ketones. Patient had reported chest pain, but EKG without significant  changes from prior. CXR negative. Last had dialysis yesterday.   ABG (venous) shows significant metabolic acidosis with pH 7.06 and bicarb 11.0. Bmet shows Na 110 (due to hyperglycemia), K > 7.5, bicarb 6, glucose 1659, AG 33. Patient given another 1 L bolus NS and started on insulin gtt. Will discuss patient with renal for dialysis and admit to internal medicine. Will place EJ for access.   Spoke with renal and do not want patient to be flooded with fluids. Potassium should come down with insulin. Renal will place consult note and dialyze as needed. IM teaching service to admit.   IM teaching service requesting for PCCM to evaluate/admit. Will discuss with PCCM.   Patient to be admitted to ICU.       Juliet Rude, MD 11/10/14 [place], MD 11/11/14 [phone]

## 2014-11-10 NOTE — H&P (Signed)
PULMONARY / CRITICAL CARE MEDICINE   Name: [name] MRN: [id number] DOB: Nov 26, 1970    ADMISSION DATE:  11/10/2014 CONSULTATION DATE:  6/15  REFERRING MD :  Arcelia Jew  CHIEF COMPLAINT:  DKA  INITIAL PRESENTATION: [name] is 44 yo with a history of ESRD (HD T,Th, Sat), T1DM, HTN, and multiple previous DKA admissions. She presented to the ED on 6/15 after a 4 day history of elevated blood glucose levels, generalized fatigue, n/v. On admission her blood glucose level was 1659 mg/dl. She had an AG gap acidosis with a initial pH of 7.06 on venous gas. She is awake and alert and hemodynamically stable.   STUDIES:    SIGNIFICANT EVENTS: 6/15: Admitted in DKA blood glucose 1638   HISTORY OF PRESENT ILLNESS:  [name] is a 44 yo female with a PMH consisting of ESRD with HD Tue, Thurs, Sat, brittle T1DM, HTN, asthma, GERD. She presents to the ED on 6/15 after 4 day history starting on 6/11 of generalized weakness, nausea, and vomiting. She reports her blood glucose levels have been reading "HIGH" on her home meter. She was taking Lantus 7 units at HS and Humalog 6 units qAM and qHS without blood sugar improvement. Her last HD session was on 6/14 and had 6.4L removed and had complaints of n/v which was treated with phenergan. She has an AG acidosis ph 7.06 with blood glucose of 1638mg /dl recorded on admission. Despite this she is hemodynamically stable in NAD. PCCM was consulted to admit and manage patient.   PAST MEDICAL HISTORY :   has a past medical history of Hyperlipidemia; Alopecia; Obesity; Iron deficiency; ESRD (end stage renal disease); RLS (restless legs syndrome); Injury of conjunctiva and corneal abrasion of right eye without foreign body; Cellulitis; Anemia; Blood transfusion (2004); Migraine; Hyperparathyroidism, secondary; Diabetic retinopathy; Hypertension; Depression; Diabetes mellitus type 1; GERD (gastroesophageal reflux disease); Bronchitis; Migraine; Asthma; Emphysema  of lung; Angina; Stomach pain; Pneumonia (2012); Dialysis patient; and History of simultaneous kidney and pancreas transplant (08/01/2011).  has past surgical history that includes Combined kidney-pancreas transplant (08/16/2002); thyroglobulin; AV fistula placement; Cholecystectomy (1995);  HD graft placement/removal; Eye surgery; Retinopathy surgery; Tooth extraction (10/10/11); Insertion of dialysis catheter (12/08/2011); AV fistula placement (01/22/2012); AV fistula placement (03/14/2012); False aneurysm repair (Right, 01/02/2013); Carpal tunnel release (Right, 03/02/2013); Flexible sigmoidoscopy (N/A, 03/24/2013); Revision of arteriovenous goretex graft (Right, 04/02/2013); Carpal tunnel release (Left, 10/16/2013); unilateral upper extremeity angiogram (N/A, 11/12/2011); and Fistulogram (Left, 03/03/2012). Prior to Admission medications   Medication Sig Start Date End Date Taking? Authorizing Provider  albuterol (PROVENTIL HFA;VENTOLIN HFA) 108 (90 BASE) MCG/ACT inhaler Inhale 2 puffs into the lungs every 4 (four) hours as needed for wheezing or shortness of breath (as per home regimen). 02/20/13  Yes Kinnie Feil, MD  aspirin 81 MG chewable tablet Chew 81 mg by mouth daily as needed for mild pain.   Yes Historical Provider, MD  calcium acetate (PHOSLO) 667 MG capsule Take 3 capsules (2,001 mg total) by mouth 3 (three) times daily with meals. 05/20/14  Yes Velvet Bathe, MD  clonazePAM (KLONOPIN) 0.5 MG tablet Take 0.5 mg by mouth 2 (two) times daily.    Yes Historical Provider, MD  Darbepoetin Alfa (ARANESP) 40 MCG/0.4ML SOSY injection Inject 0.4 mLs (40 mcg total) into the vein every Thursday with hemodialysis. 08/05/14  Yes Geradine Girt, DO  doxercalciferol (HECTOROL) 4 MCG/2ML injection Inject 1 mL (2 mcg total) into the vein Every Tuesday,Thursday,and Saturday with dialysis. 08/05/14  Yes Janett Billow  U Vann, DO  gabapentin (NEURONTIN) 100 MG capsule Take 100 mg by mouth 2 (two) times daily.    Yes Historical  Provider, MD  insulin aspart (NOVOLOG) 100 UNIT/ML injection Inject 7 Units into the skin 3 (three) times daily with meals. 08/05/14  Yes Jessica U Vann, DO  insulin glargine (LANTUS) 100 UNIT/ML injection Inject 0.07 mLs (7 Units total) into the skin 2 (two) times daily. 08/05/14  Yes Jessica U Vann, DO  lidocaine (LIDODERM) 5 % Place 1 patch onto the skin daily. Remove & Discard patch within 12 hours or as directed by MD 05/04/14  Yes Liam Graham, PA-C  metoCLOPramide (REGLAN) 5 MG tablet Take 1 tablet (5 mg total) by mouth 4 (four) times daily -  before meals and at bedtime. 08/05/14  Yes Geradine Girt, DO  metoprolol succinate (TOPROL-XL) 100 MG 24 hr tablet Take 1 tablet (100 mg total) by mouth daily. Take with or immediately following a meal. 05/20/14  Yes Velvet Bathe, MD  montelukast (SINGULAIR) 10 MG tablet Take 10 mg by mouth at bedtime.   Yes Historical Provider, MD  multivitamin (RENA-VIT) TABS tablet Take 1 tablet by mouth daily. 08/05/14  Yes Geradine Girt, DO  Nutritional Supplements (FEEDING SUPPLEMENT, NEPRO CARB STEADY,) LIQD Take 237 mLs by mouth 2 (two) times daily between meals. 04/23/14  Yes Orson Eva, MD  pantoprazole (PROTONIX) 40 MG tablet Take 1 tablet (40 mg total) by mouth at bedtime. 08/05/14  Yes Geradine Girt, DO  promethazine (PHENERGAN) 25 MG tablet Take 25 mg by mouth every 6 (six) hours as needed for nausea or vomiting.   Yes Historical Provider, MD  simvastatin (ZOCOR) 20 MG tablet Take 1 tablet (20 mg total) by mouth at bedtime. 05/20/14  Yes Velvet Bathe, MD  zolpidem (AMBIEN) 5 MG tablet Take 5 mg by mouth at bedtime as needed. sleep 02/09/14  Yes Historical Provider, MD  aspirin 325 MG tablet Take 1 tablet (325 mg total) by mouth daily. Patient not taking: Reported on 11/10/2014 05/20/14   Velvet Bathe, MD   Allergies  Allergen Reactions  . Depakote [Divalproex Sodium] Other (See Comments)    Pt gets "delirious"  . Morphine And Related Nausea And Vomiting  and Other (See Comments)    "makes me delirious"  . Penicillins Anaphylaxis    Tolerated Zosyn Dec 2014  . Tramadol Nausea And Vomiting  . Imitrex [Sumatriptan] Other (See Comments)    seizures  . Vicodin [Hydrocodone-Acetaminophen] Itching and Rash    FAMILY HISTORY:  indicated that her mother is deceased. She indicated that her father is alive.  SOCIAL HISTORY:  reports that she has never smoked. She has never used smokeless tobacco. She reports that she does not drink alcohol or use illicit drugs.  REVIEW OF SYSTEMS:  Constitutional: Positive for fever, denies unexplained wt loss. HEENT: neg HA, blurred vision, sore throat. Card: Positive Nonradiating CP, denies palpitations. Resp: Positive for Productive Cough, negative for SOB. GI: Positive for N/V/D, denies abd pain. GU: Anuric. Neuro: Denies HA, extremity weakness.  SUBJECTIVE:  "feels bad" and is experiencing nausea.  VITAL SIGNS: Temp:  [97.6 F (36.4 C)-98 F (36.7 C)] 97.6 F (36.4 C) (06/15 1628) Pulse Rate:  [47-84] 83 (06/15 1500) Resp:  [16-32] 19 (06/15 1500) BP: (101-126)/(48-64) 121/59 mmHg (06/15 1500) SpO2:  [81 %-100 %] 100 % (06/15 1500) HEMODYNAMICS:   VENTILATOR SETTINGS:   INTAKE / OUTPUT: No intake or output data in the 24 hours ending  11/10/14 1701  PHYSICAL EXAMINATION: General: Awake and alert sitting up in bed in NAD Neuro:  Follows commands. CN II-XII grossly intact HEENT: [place]/AT Cardiovascular:  Regular rate and rhythm, without rubs, gallops, or murmurs. LE graft positive thrill and positive bruit.  Lungs:  Diminished in the based bilaterally Abdomen:  Soft, nontender, nondistened Musculoskeletal:  intact Skin: no wounds or rashes.   LABS:  CBC  Recent Labs Lab 11/10/14 0908 11/10/14 1610  WBC 10.1 9.7  HGB 10.6* 8.8*  HCT 36.7 26.1*  PLT 192 156   Coag's No results for input(s): APTT, INR in the last 168 hours. BMET  Recent Labs Lab 11/10/14 0908 11/10/14 1055  11/10/14 1610  NA 110* 109* 116*  K >7.5* >7.5* 3.8  CL 71* 72* 80*  CO2 6* 8* 14*  BUN 54* 55* 56*  CREATININE 8.38* 7.90* 8.24*  GLUCOSE 1659* 1638* 1125*   Electrolytes  Recent Labs Lab 11/10/14 0908 11/10/14 1055 11/10/14 1610  CALCIUM 6.8* 6.2* 6.5*  MG  --  1.9  --   PHOS  --  10.6*  --    Sepsis Markers No results for input(s): LATICACIDVEN, PROCALCITON, O2SATVEN in the last 168 hours. ABG No results for input(s): PHART, PCO2ART, PO2ART in the last 168 hours. Liver Enzymes No results for input(s): AST, ALT, ALKPHOS, BILITOT, ALBUMIN in the last 168 hours. Cardiac Enzymes  Recent Labs Lab 11/10/14 1610  TROPONINI 0.08*   Glucose  Recent Labs Lab 11/10/14 0829 11/10/14 1219 11/10/14 1329 11/10/14 1445  GLUCAP >600* >600* >600* >600*    Imaging Dg Chest Port 1 View  11/10/2014   CLINICAL DATA:  Dyspnea  EXAM: PORTABLE CHEST - 1 VIEW  COMPARISON:  08/04/2014  FINDINGS: Chronic cardiomegaly, accentuated by hypoventilation and portable technique. Stable aortic and hilar contours, distorted from technique.  Interstitial and hazy density crowding at the bases without definite pneumonia or edema. No effusion or pneumothorax.  IMPRESSION: 1. Bibasilar opacity is likely atelectasis given hypoventilation. No edema or definitive pneumonia. 2. Chronic cardiomegaly.   Electronically Signed   By: Monte Fantasia M.D.   On: 11/10/2014 10:06     ASSESSMENT / PLAN:  PULMONARY  A: Atelectasis P:   -IS Q1 hr while awake -Maintain sats >92% Watch for evidence of volume overload.   CARDIOVASCULAR CVL- PIVs in place for now  A:  Hypovolemia  CP HTN -EKG- done- no ST changes P:  -Cycle CEs -Fluid resus with 2L NS completed -Continue NS for now will switch to NS when corrected sodium reaches 135mg /dl -D5 0.45 once blood glucose 250mg /dl -Continuous monitoring -Will hold home HTN meds for now    RENAL A:   ESRD - HD dependent (T, Th, Sat) AG  Acidosis Hyperkalemia Hyponatremia  P:   - Fluid resus per above - will replete lytes as needed, considering ESRD -nephrology following -Strict I&Os -Q4 hour BMP, mag, phos  GASTROINTESTINAL A:  Nausea  P:   -One time dose of phenergan given -NPO for now until AG closes  HEMATOLOGIC A:   Anemia P:  -monitor CBC  INFECTIOUS A:  Febrile at home P:   -monitor fever curve -monitor WBC  BCx2 >>>6/15 UC >anuric  Will defer abx at this time until culture data  ENDOCRINE A:   DKA T1DM   P:   -IV insulin until AGap closes then admin Lantus and start on reg insulin -IVF per above  NEUROLOGIC  A:  No acute problem P:   RASS goal:  0   FAMILY  - Updates: pt able to contact family    TODAY'S SUMMARY: [name] is a 44 yo female with a history of T1DM, ESRD on HD, HTN, multiple DKA admissions in the past, admitted 6/15 in DKA with blood glucose of 1659mg /dl and 7.06 pH. She is hemodynamically stable and in no acute distress. We gently fluid resusitated her in light of her HD dependent HD, and started on IV insulin per protocol. Will continue IVF, IV insulin until Gap closes.       Pulmonary and [place] Pager: [phone]  11/10/2014, 5:01 PM   Attending:  I have seen and examined the patient with nurse practitioner/resident and agree with the note above.   Ms. Rambeau is a brittle diabetic who told us she has been compliant with insulin but developed abdominal pain leading to DKA as noted above.  On exam lungs clear, abdomen soft, surgical scars noted.  Labs reviewed> anion gap acidosis consistent with DKA DKA> plan protocol, gentle fluids, follow BMET closely, don't think there is sepsis underlying this as no fever, chills, etc ESRD> plan HD in AM Hyperkalemia> treat DKA, follow BMET  Will admit to ICU and ask IMTS to pick up tomorrow  Roselie Awkward, MD [place] PCCM Pager: [phone] Cell: [phone] After  3pm or if no response, call [phone]

## 2014-11-10 NOTE — ED Notes (Signed)
Per EMS - hyperglycemia as chief complaint. Dialysis Tues/Thurs/Sat. Last dialysis yesterday. Takes insulin at home. Initially short of breath, placed on 2L. Fistula in right upper leg. BP 130/70, 84bpm.

## 2014-11-10 NOTE — Progress Notes (Signed)
Dr. Emmit Alexanders in [place] was notified of lab results (glucose, Na, K, troponin) as well as current insulin gtt rates (29 u/hr).  No new orders

## 2014-11-10 NOTE — ED Notes (Addendum)
CBG = HI  RN Abigail Butts informed of results.

## 2014-11-10 NOTE — Progress Notes (Signed)
eLink Physician-Brief Progress Note Patient Name: [name] DOB: 18-Oct-1970 MRN: [id number]   Date of Service  11/10/2014  HPI/Events of Note  Troponin = 0.18 in setting of ESRD and DKA. Continue to trend troponin.  eICU Interventions  Will order: 1. EKG now. 2. ASA Suppository 300 mg now and Q day. 2. Metoprolol 2.5 mg IV Q 6 hours with hold parameters.      Intervention Category Intermediate Interventions: Diagnostic test evaluation  Lysle Dingwall 11/10/2014, 10:46 PM

## 2014-11-10 NOTE — ED Notes (Signed)
[place] for pt to drink water per EDP.

## 2014-11-10 NOTE — ED Notes (Signed)
Critical Care MD at bedside.  

## 2014-11-10 NOTE — Progress Notes (Signed)
CRITICAL VALUE ALERT  Critical value received:  Na+: 119 and Glucose: 659  Date of notification:  6/15  Time of notification:  2046  Critical value read back:Yes.    Nurse who received alert:  Corinda Gubler  MD notified (1st page):  Warren Lacy MD  Time of first page:  2050  Responding MD:  Dr. Oletta Darter  Time MD responded:  2050  No new orders at this time.

## 2014-11-10 NOTE — ED Notes (Signed)
RT attempting to place Art line

## 2014-11-10 NOTE — ED Notes (Signed)
Attempt to call report x 1  

## 2014-11-10 NOTE — ED Notes (Addendum)
CBG = HI  RN Jerene Pitch informed of results.

## 2014-11-10 NOTE — ED Notes (Signed)
Lab results given to Dr. Goldston. 

## 2014-11-11 ENCOUNTER — Ambulatory Visit: Payer: Medicare Other | Admitting: Vascular Surgery

## 2014-11-11 LAB — CBC
HCT: 25 % — ABNORMAL LOW (ref 36.0–46.0)
Hemoglobin: 9 g/dL — ABNORMAL LOW (ref 12.0–15.0)
MCH: 32.3 pg (ref 26.0–34.0)
MCHC: 36 g/dL (ref 30.0–36.0)
MCV: 89.6 fL (ref 78.0–100.0)
Platelets: 153 10*3/uL (ref 150–400)
RBC: 2.79 MIL/uL — ABNORMAL LOW (ref 3.87–5.11)
RDW: 13.6 % (ref 11.5–15.5)
WBC: 11.6 10*3/uL — ABNORMAL HIGH (ref 4.0–10.5)

## 2014-11-11 LAB — GLUCOSE, CAPILLARY
GLUCOSE-CAPILLARY: 116 mg/dL — AB (ref 65–99)
GLUCOSE-CAPILLARY: 97 mg/dL (ref 65–99)
GLUCOSE-CAPILLARY: 136 mg/dL — AB (ref 65–99)
GLUCOSE-CAPILLARY: 192 mg/dL — AB (ref 65–99)
GLUCOSE-CAPILLARY: 79 mg/dL (ref 65–99)
GLUCOSE-CAPILLARY: 180 mg/dL — AB (ref 65–99)
GLUCOSE-CAPILLARY: 57 mg/dL — AB (ref 65–99)
GLUCOSE-CAPILLARY: 175 mg/dL — AB (ref 65–99)
Glucose-Capillary: 111 mg/dL — ABNORMAL HIGH (ref 65–99)
Glucose-Capillary: 126 mg/dL — ABNORMAL HIGH (ref 65–99)
Glucose-Capillary: 131 mg/dL — ABNORMAL HIGH (ref 65–99)
Glucose-Capillary: 147 mg/dL — ABNORMAL HIGH (ref 65–99)
Glucose-Capillary: 150 mg/dL — ABNORMAL HIGH (ref 65–99)
Glucose-Capillary: 157 mg/dL — ABNORMAL HIGH (ref 65–99)
Glucose-Capillary: 166 mg/dL — ABNORMAL HIGH (ref 65–99)
Glucose-Capillary: 183 mg/dL — ABNORMAL HIGH (ref 65–99)
Glucose-Capillary: 183 mg/dL — ABNORMAL HIGH (ref 65–99)
Glucose-Capillary: 191 mg/dL — ABNORMAL HIGH (ref 65–99)
Glucose-Capillary: 277 mg/dL — ABNORMAL HIGH (ref 65–99)
Glucose-Capillary: 71 mg/dL (ref 65–99)
Glucose-Capillary: 95 mg/dL (ref 65–99)

## 2014-11-11 LAB — BASIC METABOLIC PANEL
ANION GAP: 19 — AB (ref 5–15)
ANION GAP: 16 — AB (ref 5–15)
Anion gap: 19 — ABNORMAL HIGH (ref 5–15)
Anion gap: 13 (ref 5–15)
Anion gap: 15 (ref 5–15)
Anion gap: 12 (ref 5–15)
BUN: 57 mg/dL — ABNORMAL HIGH (ref 6–20)
BUN: 59 mg/dL — ABNORMAL HIGH (ref 6–20)
BUN: 57 mg/dL — ABNORMAL HIGH (ref 6–20)
BUN: 23 mg/dL — ABNORMAL HIGH (ref 6–20)
BUN: 26 mg/dL — AB (ref 6–20)
BUN: 30 mg/dL — ABNORMAL HIGH (ref 6–20)
CALCIUM: 8.7 mg/dL — AB (ref 8.9–10.3)
CHLORIDE: 90 mmol/L — AB (ref 101–111)
CHLORIDE: 98 mmol/L — AB (ref 101–111)
CHLORIDE: 98 mmol/L — AB (ref 101–111)
CO2: 17 mmol/L — ABNORMAL LOW (ref 22–32)
CO2: 18 mmol/L — ABNORMAL LOW (ref 22–32)
CO2: 17 mmol/L — ABNORMAL LOW (ref 22–32)
CO2: 22 mmol/L (ref 22–32)
CO2: 21 mmol/L — ABNORMAL LOW (ref 22–32)
CO2: 21 mmol/L — AB (ref 22–32)
CREATININE: 4.37 mg/dL — AB (ref 0.44–1.00)
Calcium: 7.4 mg/dL — ABNORMAL LOW (ref 8.9–10.3)
Calcium: 7.4 mg/dL — ABNORMAL LOW (ref 8.9–10.3)
Calcium: 7.4 mg/dL — ABNORMAL LOW (ref 8.9–10.3)
Calcium: 8.2 mg/dL — ABNORMAL LOW (ref 8.9–10.3)
Calcium: 8.5 mg/dL — ABNORMAL LOW (ref 8.9–10.3)
Chloride: 88 mmol/L — ABNORMAL LOW (ref 101–111)
Chloride: 88 mmol/L — ABNORMAL LOW (ref 101–111)
Chloride: 96 mmol/L — ABNORMAL LOW (ref 101–111)
Creatinine, Ser: 8.53 mg/dL — ABNORMAL HIGH (ref 0.44–1.00)
Creatinine, Ser: 8.76 mg/dL — ABNORMAL HIGH (ref 0.44–1.00)
Creatinine, Ser: 8.87 mg/dL — ABNORMAL HIGH (ref 0.44–1.00)
Creatinine, Ser: 5.32 mg/dL — ABNORMAL HIGH (ref 0.44–1.00)
Creatinine, Ser: 6.12 mg/dL — ABNORMAL HIGH (ref 0.44–1.00)
GFR calc Af Amer: 13 mL/min — ABNORMAL LOW (ref >60)
GFR calc Af Amer: 11 mL/min — ABNORMAL LOW (ref >60)
GFR calc Af Amer: 9 mL/min — ABNORMAL LOW (ref >60)
GFR calc non Af Amer: 12 mL/min — ABNORMAL LOW (ref >60)
GFR calc non Af Amer: 8 mL/min — ABNORMAL LOW (ref >60)
GFR, EST AFRICAN AMERICAN: 6 mL/min — AB (ref >60)
GFR, EST AFRICAN AMERICAN: 6 mL/min — AB (ref >60)
GFR, EST AFRICAN AMERICAN: 6 mL/min — AB (ref >60)
GFR, EST NON AFRICAN AMERICAN: 5 mL/min — AB (ref >60)
GFR, EST NON AFRICAN AMERICAN: 5 mL/min — AB (ref >60)
GFR, EST NON AFRICAN AMERICAN: 5 mL/min — AB (ref >60)
GFR, EST NON AFRICAN AMERICAN: 9 mL/min — AB (ref >60)
GLUCOSE: 174 mg/dL — AB (ref 65–99)
GLUCOSE: 60 mg/dL — AB (ref 65–99)
Glucose, Bld: 136 mg/dL — ABNORMAL HIGH (ref 65–99)
Glucose, Bld: 102 mg/dL — ABNORMAL HIGH (ref 65–99)
Glucose, Bld: 104 mg/dL — ABNORMAL HIGH (ref 65–99)
Glucose, Bld: 121 mg/dL — ABNORMAL HIGH (ref 65–99)
POTASSIUM: 4.2 mmol/L (ref 3.5–5.1)
POTASSIUM: 3.7 mmol/L (ref 3.5–5.1)
POTASSIUM: 3.1 mmol/L — AB (ref 3.5–5.1)
POTASSIUM: 3.5 mmol/L (ref 3.5–5.1)
Potassium: 3.8 mmol/L (ref 3.5–5.1)
Potassium: 4.1 mmol/L (ref 3.5–5.1)
SODIUM: 124 mmol/L — AB (ref 135–145)
SODIUM: 124 mmol/L — AB (ref 135–145)
SODIUM: 124 mmol/L — AB (ref 135–145)
SODIUM: 132 mmol/L — AB (ref 135–145)
SODIUM: 131 mmol/L — AB (ref 135–145)
Sodium: 133 mmol/L — ABNORMAL LOW (ref 135–145)

## 2014-11-11 LAB — RENAL FUNCTION PANEL
Albumin: 3 g/dL — ABNORMAL LOW (ref 3.5–5.0)
Anion gap: 19 — ABNORMAL HIGH (ref 5–15)
BUN: 57 mg/dL — ABNORMAL HIGH (ref 6–20)
CALCIUM: 7.3 mg/dL — AB (ref 8.9–10.3)
CO2: 17 mmol/L — ABNORMAL LOW (ref 22–32)
CREATININE: 8.86 mg/dL — AB (ref 0.44–1.00)
Chloride: 88 mmol/L — ABNORMAL LOW (ref 101–111)
GFR calc Af Amer: 6 mL/min — ABNORMAL LOW (ref >60)
GFR calc non Af Amer: 5 mL/min — ABNORMAL LOW (ref >60)
GLUCOSE: 101 mg/dL — AB (ref 65–99)
Phosphorus: 4.9 mg/dL — ABNORMAL HIGH (ref 2.5–4.6)
Potassium: 3.9 mmol/L (ref 3.5–5.1)
Sodium: 124 mmol/L — ABNORMAL LOW (ref 135–145)

## 2014-11-11 LAB — TROPONIN I
TROPONIN I: 0.24 ng/mL — AB (ref <0.031)
Troponin I: 0.18 ng/mL — ABNORMAL HIGH (ref <0.031)

## 2014-11-11 LAB — HEMOGLOBIN A1C
Hgb A1c MFr Bld: 12.4 % — ABNORMAL HIGH (ref 4.8–5.6)
Mean Plasma Glucose: 309 mg/dL

## 2014-11-11 MED ORDER — GABAPENTIN 100 MG PO CAPS
100.0000 mg | ORAL_CAPSULE | Freq: Two times a day (BID) | ORAL | Status: DC
  Administered 2014-11-11 – 2014-11-16 (×9): 100 mg via ORAL
  Filled 2014-11-11 (×12): qty 1

## 2014-11-11 MED ORDER — SODIUM CHLORIDE 0.9 % IV SOLN: 100.0000 mL | INTRAVENOUS | Status: DC | PRN

## 2014-11-11 MED ORDER — POTASSIUM CHLORIDE 10 MEQ/100ML IV SOLN
10.0000 meq | INTRAVENOUS | Status: AC
  Administered 2014-11-11 (×2): 10 meq via INTRAVENOUS
  Filled 2014-11-11 (×2): qty 100

## 2014-11-11 MED ORDER — ALTEPLASE 2 MG IJ SOLR
2.0000 mg | Freq: Once | INTRAMUSCULAR | Status: AC | PRN
Start: 2014-11-11 — End: 2014-11-11
  Filled 2014-11-11: qty 2

## 2014-11-11 MED ORDER — HEPARIN SODIUM (PORCINE) 1000 UNIT/ML DIALYSIS
1000.0000 [IU] | INTRAMUSCULAR | Status: DC | PRN
  Filled 2014-11-11: qty 1

## 2014-11-11 MED ORDER — LIDOCAINE-PRILOCAINE 2.5-2.5 % EX CREA: 1.0000 "application " | TOPICAL_CREAM | CUTANEOUS | Status: DC | PRN

## 2014-11-11 MED ORDER — ONDANSETRON HCL 4 MG PO TABS
4.0000 mg | ORAL_TABLET | Freq: Once | ORAL | Status: AC
  Administered 2014-11-11: 4 mg via ORAL

## 2014-11-11 MED ORDER — PENTAFLUOROPROP-TETRAFLUOROETH EX AERO
1.0000 "application " | INHALATION_SPRAY | CUTANEOUS | Status: DC | PRN
  Administered 2014-11-13: 1 via TOPICAL

## 2014-11-11 MED ORDER — POTASSIUM CHLORIDE CRYS ER 20 MEQ PO TBCR: 40.0000 meq | EXTENDED_RELEASE_TABLET | Freq: Once | ORAL | Status: DC

## 2014-11-11 MED ORDER — ACETAMINOPHEN 325 MG PO TABS: 650.0000 mg | ORAL_TABLET | Freq: Four times a day (QID) | ORAL | Status: DC | PRN

## 2014-11-11 MED ORDER — PNEUMOCOCCAL VAC POLYVALENT 25 MCG/0.5ML IJ INJ
0.5000 mL | INJECTION | INTRAMUSCULAR | Status: AC
  Administered 2014-11-12: 0.5 mL via INTRAMUSCULAR
  Filled 2014-11-11 (×2): qty 0.5

## 2014-11-11 MED ORDER — GABAPENTIN 100 MG PO CAPS: 100.0000 mg | ORAL_CAPSULE | Freq: Two times a day (BID) | ORAL | Status: DC

## 2014-11-11 MED ORDER — ASPIRIN EC 325 MG PO TBEC
325.0000 mg | DELAYED_RELEASE_TABLET | Freq: Every day | ORAL | Status: DC
  Administered 2014-11-11 – 2014-11-16 (×4): 325 mg via ORAL
  Filled 2014-11-11 (×6): qty 1

## 2014-11-11 MED ORDER — NEPRO/CARBSTEADY PO LIQD: 237.0000 mL | ORAL | Status: DC | PRN

## 2014-11-11 MED ORDER — POTASSIUM CHLORIDE CRYS ER 20 MEQ PO TBCR
20.0000 meq | EXTENDED_RELEASE_TABLET | Freq: Once | ORAL | Status: AC
  Administered 2014-11-11: 20 meq via ORAL
  Filled 2014-11-11: qty 1

## 2014-11-11 MED ORDER — ASPIRIN 325 MG PO TABS: 325.0000 mg | ORAL_TABLET | Freq: Every day | ORAL | Status: DC

## 2014-11-11 MED ORDER — DOXERCALCIFEROL 4 MCG/2ML IV SOLN
INTRAVENOUS | Status: AC
  Filled 2014-11-11: qty 2

## 2014-11-11 MED ORDER — METOPROLOL SUCCINATE ER 100 MG PO TB24
100.0000 mg | ORAL_TABLET | Freq: Every day | ORAL | Status: DC
  Administered 2014-11-11 – 2014-11-13 (×3): 100 mg via ORAL
  Filled 2014-11-11 (×4): qty 1

## 2014-11-11 MED ORDER — LIDOCAINE HCL (PF) 1 % IJ SOLN: 5.0000 mL | INTRAMUSCULAR | Status: DC | PRN

## 2014-11-11 MED ORDER — ONDANSETRON HCL 4 MG/2ML IJ SOLN
4.0000 mg | Freq: Three times a day (TID) | INTRAMUSCULAR | Status: DC | PRN
  Administered 2014-11-11: 4 mg via INTRAVENOUS
  Filled 2014-11-11: qty 2

## 2014-11-11 MED ORDER — PROMETHAZINE HCL 25 MG/ML IJ SOLN
12.5000 mg | Freq: Once | INTRAMUSCULAR | Status: AC | PRN
  Administered 2014-11-11: 12.5 mg via INTRAVENOUS
  Filled 2014-11-11: qty 1

## 2014-11-11 MED ORDER — DEXTROSE 50 % IV SOLN
INTRAVENOUS | Status: AC
Start: 2014-11-11 — End: 2014-11-11
  Administered 2014-11-11: 50 mL
  Filled 2014-11-11: qty 50

## 2014-11-11 NOTE — Consult Note (Addendum)
   [facility] CM Inpatient Consult   11/11/2014  [name] 04-16-1971 [id number] Referral received. Patient evaluated for [place] Management services. Patient is not eligible for [facility] Care Management services because unfortunately, patient's Medicare is not in the delegation for [place] Management services at this time. Will update inpatient care manager of outcome. For questions, please contact: Natividad Brood, RN BSN [facility] Liaison  [phone] business mobile phone

## 2014-11-11 NOTE — Progress Notes (Signed)
Patient ID: [name], female   DOB: [birth year], 44 y.o.   MRN: [id number] Medicine attending transfer note: I personally interviewed and examined this patient, reviewed pertinent clinical, laboratory, and x-ray data, and I attest to the accuracy of the evaluation and management plan as recorded by resident physician Dr. Albin Felling.  Clinical summary: Unfortunate 44 year old woman who has type 1 diabetes diagnosed at age 84. She has developed multiple complications with hypertension, retinopathy, and end-stage renal disease on dialysis. 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 She has had multiple dialysis access catheters and fistulas placed and currently has an active graft in the right thigh. Her diabetes is very brittle. She has had multiple admissions for DKA. She presents at the time of the current admission on 11/10/2014 with a 4 day history of weakness, nausea, vomiting, and elevated glucose. On initial evaluation in the emergency department, by one of our medical residents who is doing an emergency room elective, blood sugar markedly elevated at 1638, she was acidotic with pH 7.06, anion gap of 33, potassium greater than 7.5, serum bicarbonate 6. A right external jugular vascular catheter was placed. She was admitted to the intensive care unit. She has stabilized with limited hydration in view of her end-stage renal status and parenteral insulin. She remains acidemic with current anion gap 19. Potassium now in normal range at 3.9. Blood sugars came down rapidly with most recent value 101 at 08:37 this morning.  Pertinent past history also includes: Hyperlipidemia, secondary hyperparathyroidism, depression, GERD, migraine, asthma, retinopathy, neuropathy,  Current exam: Blood pressure 91/77, pulse 95, temperature 98.5 F (36.9 C), temperature source Oral, resp. rate 13, height 5\' 1"  (1.549 m), weight 166 lb 10.7 oz (75.6 kg), last menstrual period 11/10/2014, SpO2  100 %./ She is alert, oriented, and appears comfortable. Dialysis in progress at the bedside. Lungs are clear. Regular cardiac rhythm without murmur. Abdomen soft and nontender. Functioning vascular graft right proximal medial thigh. Extremities no edema, no calf tenderness. Neurologic grossly normal.  Impression: Recurrent DKA Rapid stabilization with fluids, insulin, and dialysis Plan: Continue current medical management.

## 2014-11-11 NOTE — Progress Notes (Signed)
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 Labs on admission showed a glucose of 1659, AG 33 and severe metabolic acidosis with pH 7.06, bicarb 11. K >7.5 on admission. Patient last had dialysis on day before admission (6/14). Patient was given 1.5 L of NS and started on an insulin gtt. Blood sugars have come down nicely overnight to 100s-130s, but still with AG 19 this morning. K stable at 3.9. She remains on the insulin gtt and D5 1/2 NS.   Patient had noted some chest pain on admission. EKG without any ST changes. Troponins were trended and found to be minimally elevated (0.08>0.19>0.24>0.18) likely due to ESRD but overall trending down. Repeat EKG overnight with some T wave inversions in II, III, aVF, V5, V6, but T wave versions in III, avF, V6 noted on prior EKGs.   Subjective: Patient reports some abdominal pain and nausea this morning. She notes her chest pain still persists but is reproducible when she touches her chest.    Objective: Vital signs in last 24 hours: Filed Vitals:   11/11/14 0830 11/11/14 0900 11/11/14 0924 11/11/14 0930  BP: 156/87 147/69  144/73  Pulse: 85 86 86 90  Temp:      TempSrc:      Resp: 22 21 16 16   Height:      Weight:      SpO2: 97% 100% 100% 100%   Weight change:   Intake/Output Summary (Last 24 hours) at 11/11/14 0953 Last data filed at 11/11/14 0900  Gross per 24 hour  Intake 1543.36 ml  Output      0 ml  Net 1543.36 ml   Physical Exam General: middle aged woman sitting up in bed, NAD HEENT: [place]/AT, EOMI, sclera anicteric, mucus membranes moist Chest: RRR, 2/6 systolic murmur heard best at LUSB. Pain reproducible with palpation to left chest.  Pulm: CTA  bilaterally, breaths non-labored on 2 L oxygen via [place] Abd: BS+, soft, non-tender, non-distended Ext: warm, no edema, moves all Neuro: alert and oriented x 3, CNs II-XII grossly intact  Lab Results: Basic Metabolic Panel:  Recent Labs Lab 11/10/14 1055  11/11/14 0836 11/11/14 0837  NA 109*  < > 124* 124*  K >7.5*  < > 3.8 3.9  CL 72*  < > 88* 88*  CO2 8*  < > 17* 17*  GLUCOSE 1638*  < > 104* 101*  BUN 55*  < > 57* 57*  CREATININE 7.90*  < > 8.87* 8.86*  CALCIUM 6.2*  < > 7.4* 7.3*  MG 1.9  --   --   --   PHOS 10.6*  --   --  4.9*  < > = values in this interval not displayed. Liver Function Tests:  Recent Labs Lab 11/11/14 0837  ALBUMIN 3.0*   CBC:  Recent Labs Lab 11/10/14 0908 11/10/14 1610 11/11/14 0836  WBC 10.1 9.7 11.6*  NEUTROABS 8.6*  --   --   HGB 10.6* 8.8* 9.0*  HCT 36.7 26.1* 25.0*  MCV 112.2* 95.6 89.6  PLT 192 156 153   Cardiac Enzymes:  Recent Labs Lab 11/10/14 2045 11/11/14 0245 11/11/14 0839  TROPONINI 0.19* 0.24* 0.18*   CBG:  Recent  Labs Lab 11/11/14 0222 11/11/14 0333 11/11/14 0437 11/11/14 0539 11/11/14 0643 11/11/14 0754  GLUCAP 111* 183* 191* 150* 97 71   Hemoglobin A1C:  Recent Labs Lab 11/10/14 1610  HGBA1C 12.4*   Studies/Results: Dg Chest Port 1 View  11/10/2014   CLINICAL DATA:  Dyspnea  EXAM: PORTABLE CHEST - 1 VIEW  COMPARISON:  08/04/2014  FINDINGS: Chronic cardiomegaly, accentuated by hypoventilation and portable technique. Stable aortic and hilar contours, distorted from technique.  Interstitial and hazy density crowding at the bases without definite pneumonia or edema. No effusion or pneumothorax.  IMPRESSION: 1. Bibasilar opacity is likely atelectasis given hypoventilation. No edema or definitive pneumonia. 2. Chronic cardiomegaly.   Electronically Signed   By: Monte Fantasia M.D.   On: 11/10/2014 10:06   Medications: I have reviewed the patient's current medications. Scheduled Meds: . antiseptic oral  rinse  7 mL Mouth Rinse BID  . aspirin EC  325 mg Oral Daily  . calcium acetate  2,001 mg Oral TID WC  . calcium acetate  667 mg Oral q12n4p  . doxercalciferol  3 mcg Intravenous Q T,Th,Sa-HD  . gabapentin  100 mg Oral BID  . heparin  5,000 Units Subcutaneous 3 times per day  . metoprolol  2.5 mg Intravenous 4 times per day  . multivitamin  1 tablet Oral QHS  . [START ON 11/12/2014] pneumococcal 23 valent vaccine  0.5 mL Intramuscular Tomorrow-1000   Continuous Infusions: . sodium chloride 75 mL/hr at 11/10/14 1900  . dextrose 5 % and 0.45% NaCl    . dextrose 5 % and 0.45% NaCl 75 mL/hr at 11/11/14 0015  . insulin (NOVOLIN-R) infusion Stopped (11/11/14 0800)   PRN Meds:.sodium chloride, sodium chloride, alteplase, feeding supplement (NEPRO CARB STEADY), heparin, lidocaine (PF), lidocaine-prilocaine, pentafluoroprop-tetrafluoroeth Assessment/Plan:  DKA in Setting of Uncontrolled Type 1 DM: Patient presented with a 5 days hx of "HIGH" blood sugars on her glucometer found to be in DKA. Patient's AG is still elevated at 19 despite her blood sugars being in the normal 100-130 range. Will keep on insulin gtt and with D5 1/2 NS until can titrate off insulin gtt and get eating.  - Transfer to tele bed - [facility] hospitalists to resume care tomorrow - Continue insulin gtt - Continue D5 1/2 NS @ 100 ml/hr - Trend bmets Q4H - Can transfer off insulin gtt once gap closed (12 or less) for 2 bmets. Will need to be given Lantus 10 units while still on drip and have patient eat. Insulin gtt will need to be on for 2 hours after Lantus given and then can be taken off.  - CBGs Q2H - Keep NPO   ESRD on HD, Secondary Hyperparathyroidism: Patient on T/Th/Sat schedule. She received dialysis this morning. K is stable at 3.9. Patient has hx of secondary hyperparathyroidism. Last PTH 123 in October 2012. Per Renal, her last PTH was 8. Calcium currently 7.3 and Phos 4.9.  - Renal following, appreciate  recommendations - Dialysis per Renal - Continue phoslo 667 mg BID - Continue to monitor electrolytes, bmets Q4H to trend AG  Atypical Chest Pain: Patient had reported left-sided chest pain on admission that she described as 8/10 in severity, substernal, achy, and non-radiating. EKG without ST changes on admission. Repeat EKG with T wave changes that are old compared to prior. Troponins minimally elevated up to 0.24 but trending down. Likely related to ESRD. She reported her chest pain was persistent this morning but the pain was reproducible on exam. Her  pain is likely musculoskeletal related.  - Continue to monitor   Hyponatremia: Na 124 this morning. Initial Na 110 on admission but this was falsely low due to significant hyperglycemia of 1659.  - Continue NS at 100 ml/hr - Will need to watch fluids given ESRD on HD  HTN: BP stable in 283T-517O systolic. Patient takes Metoprolol 100 mg daily at home.  - Continue Lopressor 2.5 mg IV Q6H for now   Normocytic Anemia: Hbg 9.0 this AM with MCV 90. Anemia panel in August 2015 shows iron normal 47, TIBC low at 194, and elevated ferritin at 930. Given her elevated ferritin and her hx of ESRD she most likely has anemia of chronic disease. - Continue to monitor  Leukocytosis: WBC count 11.6 this AM. No leukocytosis on admission. Patient had reported some fevers at home. She has remained afebrile while in the hospital. Blood cx with no growth to date. Leukocytosis could be reactive.  - Will hold off antibiotics as no other evidence of acute infection - Follow up blood cultures - Trend fever curve and WBC count    Diet: NPO for now while still on insulin gtt VTE PPx: Heparin SQ Dispo: Disposition is deferred at this time, awaiting improvement of current medical problems.  Anticipated discharge in approximately 2-3 day(s).   The patient does have a current PCP [name], MD) and does need an [facility] hospital follow-up appointment after  discharge.  The patient does not have transportation limitations that hinder transportation to clinic appointments.  .Services Needed at time of discharge: Y = Yes, Blank = No PT:   OT:   RN:   Equipment:   Other:     LOS: 1 day   Juliet Rude, MD 11/11/2014, 9:53 AM

## 2014-11-11 NOTE — Progress Notes (Signed)
IV Team at bedside to place new PIV, pt refuses. Currently has EJ R neck and history of IR placed central lines, which she requests at this time instead of PIV attempts. Floor RN at bedside aware, to notify MD.

## 2014-11-11 NOTE — Progress Notes (Signed)
Inpatient Diabetes Program Recommendations  AACE/ADA: New Consensus Statement on Inpatient Glycemic Control (2013)  Target Ranges:  Prepandial:   less than 140 mg/dL      Peak postprandial:   less than 180 mg/dL (1-2 hours)      Critically ill patients:  140 - 180 mg/dL   Results for [name], [name] (MRN [id number]) as of 11/11/2014 13:32  Ref. Range 11/11/2014 01:18 11/11/2014 02:22 11/11/2014 03:33 11/11/2014 04:37 11/11/2014 05:39 11/11/2014 06:43 11/11/2014 07:54 11/11/2014 09:04 11/11/2014 10:07 11/11/2014 11:07 11/11/2014 11:56 11/11/2014 13:25  Glucose-Capillary Latest Ref Range: 65-99 mg/dL 116 (H) 111 (H) 183 (H) 191 (H) 150 (H) 97 71 136 (H) 192 (H) 183 (H) 157 (H) 95   Diabetes history: DM1 Outpatient Diabetes medications: Lantus 7 units BID, Novolog 7 units TID with meals Current orders for Inpatient glycemic control: Novolin R insulin drip per DKA protocol  Inpatient Diabetes Program Recommendations Insulin - Basal: Once criteria is meet per DKA protocol to transiton for IV to SQ insulin, please consider ordering Lantus 5 units BID. Correction (SSI): Once criteria is meet per DKA protocol to transiton for IV to SQ insulin, please consider ordering Novolog sensitive correction scale Q4H.  Thanks, Barnie Alderman, RN, MSN, CCRN, CDE Diabetes Coordinator Inpatient Diabetes Program [phone] (Team Pager from [place] to [place]) [phone] ([place] office) [phone] [facility] office) [phone] [facility] office)

## 2014-11-11 NOTE — Progress Notes (Signed)
Dr. Hulen Luster paged . Patient CBG 57. Currently does not have D51/2NS @ 100 infusing. RN began infusing. Per MD give patient amp of D50. Per glucostabilizer, stop insulin drip until time to recheck. Will continue to monitor.

## 2014-11-11 NOTE — Progress Notes (Signed)
Patient complaining of midsternal chest pain. Describes as "achy" - does not radiate. Pt states she has felt pain since she was admitted. Patient also asking for ambien at this time. EKG completed. V/s in. Dr. Hulen Luster notified. Orders placed.

## 2014-11-11 NOTE — Progress Notes (Signed)
eLink Physician-Brief Progress Note Patient Name: [name] DOB: Oct 11, 1970 MRN: [id number]   Date of Service  11/11/2014  HPI/Events of Note  bmet still gap esrd k low  eICU Interventions  Insulin still needed k supp conservative     Intervention Category Intermediate Interventions: Electrolyte abnormality - evaluation and management  [name]. 11/11/2014, 12:26 AM

## 2014-11-12 DIAGNOSIS — E1010 Type 1 diabetes mellitus with ketoacidosis without coma: Secondary | ICD-10-CM | POA: Diagnosis not present

## 2014-11-12 DIAGNOSIS — I10 Essential (primary) hypertension: Secondary | ICD-10-CM

## 2014-11-12 DIAGNOSIS — K3184 Gastroparesis: Secondary | ICD-10-CM

## 2014-11-12 LAB — BASIC METABOLIC PANEL
ANION GAP: 14 (ref 5–15)
Anion gap: 15 (ref 5–15)
Anion gap: 16 — ABNORMAL HIGH (ref 5–15)
BUN: 32 mg/dL — ABNORMAL HIGH (ref 6–20)
BUN: 32 mg/dL — ABNORMAL HIGH (ref 6–20)
BUN: 31 mg/dL — AB (ref 6–20)
CALCIUM: 8.2 mg/dL — AB (ref 8.9–10.3)
CHLORIDE: 96 mmol/L — AB (ref 101–111)
CO2: 19 mmol/L — ABNORMAL LOW (ref 22–32)
CO2: 20 mmol/L — ABNORMAL LOW (ref 22–32)
CO2: 18 mmol/L — AB (ref 22–32)
CREATININE: 6.64 mg/dL — AB (ref 0.44–1.00)
CREATININE: 7.13 mg/dL — AB (ref 0.44–1.00)
Calcium: 8.3 mg/dL — ABNORMAL LOW (ref 8.9–10.3)
Calcium: 8.2 mg/dL — ABNORMAL LOW (ref 8.9–10.3)
Chloride: 96 mmol/L — ABNORMAL LOW (ref 101–111)
Chloride: 97 mmol/L — ABNORMAL LOW (ref 101–111)
Creatinine, Ser: 7.15 mg/dL — ABNORMAL HIGH (ref 0.44–1.00)
GFR calc Af Amer: 8 mL/min — ABNORMAL LOW (ref >60)
GFR calc Af Amer: 7 mL/min — ABNORMAL LOW (ref >60)
GFR calc Af Amer: 7 mL/min — ABNORMAL LOW (ref >60)
GFR calc non Af Amer: 6 mL/min — ABNORMAL LOW (ref >60)
GFR calc non Af Amer: 6 mL/min — ABNORMAL LOW (ref >60)
GFR, EST NON AFRICAN AMERICAN: 7 mL/min — AB (ref >60)
GLUCOSE: 158 mg/dL — AB (ref 65–99)
GLUCOSE: 220 mg/dL — AB (ref 65–99)
Glucose, Bld: 133 mg/dL — ABNORMAL HIGH (ref 65–99)
POTASSIUM: 3.6 mmol/L (ref 3.5–5.1)
Potassium: 3.8 mmol/L (ref 3.5–5.1)
Potassium: 3.2 mmol/L — ABNORMAL LOW (ref 3.5–5.1)
Sodium: 130 mmol/L — ABNORMAL LOW (ref 135–145)
Sodium: 131 mmol/L — ABNORMAL LOW (ref 135–145)
Sodium: 130 mmol/L — ABNORMAL LOW (ref 135–145)

## 2014-11-12 LAB — GLUCOSE, CAPILLARY
GLUCOSE-CAPILLARY: 116 mg/dL — AB (ref 65–99)
GLUCOSE-CAPILLARY: 136 mg/dL — AB (ref 65–99)
GLUCOSE-CAPILLARY: 219 mg/dL — AB (ref 65–99)
GLUCOSE-CAPILLARY: 227 mg/dL — AB (ref 65–99)
GLUCOSE-CAPILLARY: 264 mg/dL — AB (ref 65–99)
GLUCOSE-CAPILLARY: 87 mg/dL (ref 65–99)
Glucose-Capillary: 242 mg/dL — ABNORMAL HIGH (ref 65–99)
Glucose-Capillary: 165 mg/dL — ABNORMAL HIGH (ref 65–99)
Glucose-Capillary: 95 mg/dL (ref 65–99)
Glucose-Capillary: 165 mg/dL — ABNORMAL HIGH (ref 65–99)
Glucose-Capillary: 167 mg/dL — ABNORMAL HIGH (ref 65–99)
Glucose-Capillary: 103 mg/dL — ABNORMAL HIGH (ref 65–99)
Glucose-Capillary: 224 mg/dL — ABNORMAL HIGH (ref 65–99)

## 2014-11-12 LAB — CBC
HEMATOCRIT: 28 % — AB (ref 36.0–46.0)
Hemoglobin: 9.6 g/dL — ABNORMAL LOW (ref 12.0–15.0)
MCH: 32.1 pg (ref 26.0–34.0)
MCHC: 34.3 g/dL (ref 30.0–36.0)
MCV: 93.6 fL (ref 78.0–100.0)
Platelets: 163 10*3/uL (ref 150–400)
RBC: 2.99 MIL/uL — ABNORMAL LOW (ref 3.87–5.11)
RDW: 14.4 % (ref 11.5–15.5)
WBC: 9.8 10*3/uL (ref 4.0–10.5)

## 2014-11-12 LAB — TROPONIN I: TROPONIN I: 0.13 ng/mL — AB (ref <0.031)

## 2014-11-12 MED ORDER — INSULIN GLARGINE 100 UNIT/ML ~~LOC~~ SOLN
10.0000 [IU] | SUBCUTANEOUS | Status: DC
  Administered 2014-11-12 – 2014-11-16 (×5): 10 [IU] via SUBCUTANEOUS
  Filled 2014-11-12 (×5): qty 0.1

## 2014-11-12 MED ORDER — GI COCKTAIL ~~LOC~~
30.0000 mL | Freq: Once | ORAL | Status: AC
  Administered 2014-11-12: 30 mL via ORAL
  Filled 2014-11-12: qty 30

## 2014-11-12 MED ORDER — METOCLOPRAMIDE HCL 5 MG/ML IJ SOLN
5.0000 mg | Freq: Once | INTRAMUSCULAR | Status: AC
  Administered 2014-11-12: 5 mg via INTRAVENOUS
  Filled 2014-11-12: qty 1

## 2014-11-12 MED ORDER — INSULIN ASPART 100 UNIT/ML ~~LOC~~ SOLN
0.0000 [IU] | Freq: Every day | SUBCUTANEOUS | Status: DC
  Administered 2014-11-13: 3 [IU] via SUBCUTANEOUS
  Administered 2014-11-15: 5 [IU] via SUBCUTANEOUS

## 2014-11-12 MED ORDER — INSULIN ASPART 100 UNIT/ML ~~LOC~~ SOLN
0.0000 [IU] | Freq: Three times a day (TID) | SUBCUTANEOUS | Status: DC
  Administered 2014-11-12: 3 [IU] via SUBCUTANEOUS
  Administered 2014-11-13: 2 [IU] via SUBCUTANEOUS
  Administered 2014-11-14: 7 [IU] via SUBCUTANEOUS
  Administered 2014-11-14: 2 [IU] via SUBCUTANEOUS
  Administered 2014-11-15 – 2014-11-16 (×2): 1 [IU] via SUBCUTANEOUS

## 2014-11-12 MED ORDER — METOCLOPRAMIDE HCL 5 MG/ML IJ SOLN
5.0000 mg | Freq: Three times a day (TID) | INTRAMUSCULAR | Status: DC
  Administered 2014-11-12: 5 mg via INTRAVENOUS
  Filled 2014-11-12 (×6): qty 1

## 2014-11-12 MED ORDER — PANTOPRAZOLE SODIUM 40 MG PO TBEC
40.0000 mg | DELAYED_RELEASE_TABLET | Freq: Every day | ORAL | Status: DC
  Administered 2014-11-12: 40 mg via ORAL
  Filled 2014-11-12: qty 1

## 2014-11-12 MED ORDER — ZOLPIDEM TARTRATE 5 MG PO TABS
5.0000 mg | ORAL_TABLET | Freq: Every evening | ORAL | Status: DC | PRN
  Administered 2014-11-13: 5 mg via ORAL
  Filled 2014-11-12: qty 1

## 2014-11-12 MED ORDER — POTASSIUM CHLORIDE CRYS ER 20 MEQ PO TBCR
20.0000 meq | EXTENDED_RELEASE_TABLET | Freq: Once | ORAL | Status: AC
  Administered 2014-11-12: 20 meq via ORAL

## 2014-11-12 NOTE — Plan of Care (Signed)
Problem: Phase I Progression Outcomes Goal: Voiding-avoid urinary catheter unless indicated Outcome: Not Met (add Reason) Patient is oliguric

## 2014-11-12 NOTE — Care Management Note (Signed)
Case Management Note  Patient Details  Name: [name] MRN: [id number] Date of Birth: 1971/01/30  Subjective/Objective:   Patient states she lives alone, she has transport at [employer],  And she states she is ok with taking her insulin.  NCM will cont to monitor for dc needs. Per [place] patient is not [facility]N candidate.                  Action/Plan:   Expected Discharge Date:                  Expected Discharge Plan:  Home/Self Care  In-House Referral:     Discharge planning Services  CM Consult  Post Acute Care Choice:    Choice offered to:     DME Arranged:    DME Agency:     HH Arranged:    HH Agency:     Status of Service:  In process, will continue to follow  Medicare Important Message Given:  Yes Date Medicare IM Given:  11/12/14 Medicare IM give by:  Tomi Bamberger RN Date Additional Medicare IM Given:    Additional Medicare Important Message give by:     If discussed at [place] of Stay Meetings, dates discussed:    Additional Comments:  Zenon Mayo, RN 11/12/2014, 11:38 AM

## 2014-11-12 NOTE — Progress Notes (Signed)
[facility] Progress Note  Assessment/Plan: 1. DKA- glucose 103.  Off insulin drip. Last CO2 20.  DKA being managed per primary. Appears to be resolving. 2. ESRD - TTS NW, K+ 3.6. HD tomorrow. No heparin. Bmet prior to start.  3. Hypertension/volume - 142/68.,  Last wt 67.7 kg volume status stable. 4. Anemia - HGB 9.6 On ESA at HD center.  5. Metabolic bone disease - Ca+ 7.5  Leave on 2.5 Ca bath tomorrow. 6. Nutrition - renal diet when advanced. multivit 7. Prolonged bleeding from AVG- consult [facility]- has appt pending 6/16 - last access flow 1743 and last intervention 4/20 at [facility] with Dr Louie Boston H. Brown NP-C 11/12/2014, 2:16 PM  [facility] [phone]  Pt seen, examined and agree w A/P as above.  Kelly Splinter MD pager [phone]    cell [phone] 11/12/2014, 3:47 PM    Subjective:     Objective Filed Vitals:   11/11/14 2132 11/11/14 2341 11/12/14 0519 11/12/14 1329  BP: 147/64 139/52 129/51 142/68  Pulse: 97 99 87 83  Temp: 99.1 F (37.3 C)  97.6 F (36.4 C) 98.9 F (37.2 C)  TempSrc: Oral  Oral Oral  Resp: 18  18 18   Height:      Weight:      SpO2: 90% 94% 94% 95%   Physical Exam General: Well nourished female in no acute distress Heart: RRR, No M/G/R Lungs:CTA Anteriorly and posteriorly. Abdomen: soft, nontender. Active BS x 4 quadrants. Extremities: No peripheral edema Neuro: A& O X3.  Dialysis Access: R. Thigh AVG  Dialysis Orders: TTS NW 3hr 45 min 69kgs 2K/2.5Ca+ No heparin R thigh AVG hectorol 3 Micera 100 q 2 weeks last dose given 6/9  Additional Objective Labs: Basic Metabolic Panel:  Recent Labs Lab 11/10/14 1055  11/11/14 0837  11/12/14 0028 11/12/14 0400 11/12/14 0818  NA 109*  < > 124*  < > 130* 131* 130*  K >7.5*  < > 3.9  < > 3.8 3.2* 3.6  CL 72*  < > 88*  < > 96* 97* 96*  CO2 8*  < > 17*  < > 19* 18* 20*  GLUCOSE 1638*  < > 101*  < > 133* 158* 220*  BUN 55*  < > 57*  < > 31* 32* 32*   CREATININE 7.90*  < > 8.86*  < > 6.64* 7.13* 7.15*  CALCIUM 6.2*  < > 7.3*  < > 8.3* 8.2* 8.2*  PHOS 10.6*  --  4.9*  --   --   --   --   < > = values in this interval not displayed. Liver Function Tests:  Recent Labs Lab 11/11/14 0837  ALBUMIN 3.0*   No results for input(s): LIPASE, AMYLASE in the last 168 hours. CBC:  Recent Labs Lab 11/10/14 0908 11/10/14 1610 11/11/14 0836 11/12/14 0400  WBC 10.1 9.7 11.6* 9.8  NEUTROABS 8.6*  --   --   --   HGB 10.6* 8.8* 9.0* 9.6*  HCT 36.7 26.1* 25.0* 28.0*  MCV 112.2* 95.6 89.6 93.6  PLT 192 156 153 163   Blood Culture    Component Value Date/Time   SDES BLOOD RIGHT ARM 11/10/2014 1943   SPECREQUEST BOTTLES DRAWN AEROBIC ONLY 3CC 11/10/2014 1943   CULT NO GROWTH 2 DAYS 11/10/2014 1943   REPTSTATUS PENDING 11/10/2014 1943    Cardiac Enzymes:  Recent Labs Lab 11/10/14 1610 11/10/14 2045 11/11/14 0245 11/11/14 0839 11/12/14 0028  TROPONINI 0.08* 0.19*  0.24* 0.18* 0.13*   CBG:  Recent Labs Lab 11/12/14 0738 11/12/14 0836 11/12/14 0939 11/12/14 1058 11/12/14 1210  GLUCAP 165* 219* 227* 167* 103*   Iron Studies: No results for input(s): IRON, TIBC, TRANSFERRIN, FERRITIN in the last 72 hours. @lablastinr3 @ Studies/Results: No results found. Medications:   . aspirin EC  325 mg Oral Daily  . calcium acetate  667 mg Oral q12n4p  . doxercalciferol  3 mcg Intravenous Q T,Th,Sa-HD  . gabapentin  100 mg Oral BID  . heparin  5,000 Units Subcutaneous 3 times per day  . insulin aspart  0-5 Units Subcutaneous QHS  . insulin aspart  0-9 Units Subcutaneous TID WC  . insulin glargine  10 Units Subcutaneous Q24H  . metoCLOPramide (REGLAN) injection  5 mg Intravenous TID AC  . metoprolol succinate  100 mg Oral Daily  . multivitamin  1 tablet Oral QHS  . pantoprazole  40 mg Oral Q1200

## 2014-11-12 NOTE — Progress Notes (Signed)
Patient states no relief from zofran 4mg  IV given at [place]. Dr. Hulen Luster notified. Orders placed for one time dose of 12.5mg  phenergan IV. MD also discontinued zofran prn order, stating that patient had prolonged QT. Will continue to monitor patient.

## 2014-11-12 NOTE — Progress Notes (Signed)
[facility] PROGRESS NOTE  [name] LKT:[id number] DOB: 10-Feb-1971 DOA: 11/10/2014  PCP: [name], MD  Brief HPI: 44 year old with a as medical history of end-stage renal disease on dialysis, type 1 diabetes, hypertension, previous hospitalizations for DKA presented with a four-day history of elevated blood sugars and generalized fatigue, nausea and vomiting. Blood glucose level was 1659 at admission. She had an elevated anion gap. She was admitted to the intensive care unit and started on insulin. Subsequently, was transferred out to the floor.  Past medical history:  Past Medical History  Diagnosis Date  . Hyperlipidemia   . Alopecia   . Obesity   . Iron deficiency   . ESRD (end stage renal disease)     S/p pancreatic and kidney transplant, but back on HD 2008, dialysis at [facility], [place], Sat  . RLS (restless legs syndrome)   . Injury of conjunctiva and corneal abrasion of right eye without foreign body   . Cellulitis   . Anemia   . Blood transfusion 2004    "when I had my transplant"  . Migraine   . Hyperparathyroidism, secondary   . Diabetic retinopathy   . Hypertension     takes Metoprolol and Exforge daily  . Depression     takes Klonopin nightly  . Diabetes mellitus type 1     Pt states diagnosed at age 17 with prior episodes of DKA. S/p pancreatic transplant   . GERD (gastroesophageal reflux disease)     takes Protonix daily  . Bronchitis   . Migraine   . Asthma   . Emphysema of lung   . Angina     none in past 2 years.  . Stomach pain     Hx: of chronic  . Pneumonia 2012    "double"  . Dialysis patient     tues,thurs,sat  . 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  Surgery was done at [facility].        Consultants: None  Procedures: None  Antibiotics: None  Subjective: Patient continues to have some nausea. Had vomiting at 4 AM this morning. Denies any diarrhea. Denies any abdominal pain. She denies noncompliance with her diabetic regimen.  Objective: Vital Signs  Filed Vitals:   11/11/14 1710 11/11/14 2132 11/11/14 2341 11/12/14 0519  BP: 113/59 147/64 139/52 129/51  Pulse: 97 97 99 87  Temp: 100.2 F (37.9 C) 99.1 F (37.3 C)  97.6 F (36.4 C)  TempSrc: Oral Oral  Oral  Resp: 18 18  18   Height: 5\' 1"  (1.549 m)     Weight: 67.7 kg (149 lb 4 oz)     SpO2: 91% 90% 94% 94%    Intake/Output Summary (Last 24 hours) at 11/12/14 0959 Last data filed at 11/12/14 0920  Gross per 24 hour  Intake 1415.7 ml  Output   5000 ml  Net -3584.3 ml   Filed Weights   11/11/14 0800 11/11/14 1155 11/11/14 1710  Weight: 75.6 kg (166 lb 10.7 oz) 70.6 kg (155 lb 10.3 oz) 67.7 kg (149 lb 4 oz)    General appearance: alert, cooperative, appears stated age and no distress Resp: clear to auscultation bilaterally Cardio: regular rate and rhythm, S1, S2 normal, no murmur, click, rub or gallop GI: soft, non-tender;  bowel sounds normal; no masses,  no organomegaly Extremities: extremities normal, atraumatic, no cyanosis or edema Neurologic: No focal deficits.  Lab Results:  Basic Metabolic Panel:  Recent Labs Lab 11/10/14 1055  11/11/14 0837  11/11/14 1510 11/11/14 1941 11/12/14 0028 11/12/14 0400 11/12/14 0818  NA 109*  < > 124*  < > 132* 131* 130* 131* 130*  K >7.5*  < > 3.9  < > 4.1 3.5 3.8 3.2* 3.6  CL 72*  < > 88*  < > 96* 98* 96* 97* 96*  CO2 8*  < > 17*  < > 21* 21* 19* 18* 20*  GLUCOSE 1638*  < > 101*  < > 121* 60* 133* 158* 220*  BUN 55*  < > 57*  < > 26* 30* 31* 32* 32*  CREATININE 7.90*  < > 8.86*  < > 5.32* 6.12* 6.64* 7.13* 7.15*  CALCIUM 6.2*  < > 7.3*  < > 8.7* 8.5* 8.3* 8.2* 8.2*  MG 1.9  --   --   --   --   --   --   --   --   PHOS 10.6*  --  4.9*  --   --    --   --   --   --   < > = values in this interval not displayed. Liver Function Tests:  Recent Labs Lab 11/11/14 0837  ALBUMIN 3.0*   CBC:  Recent Labs Lab 11/10/14 0908 11/10/14 1610 11/11/14 0836 11/12/14 0400  WBC 10.1 9.7 11.6* 9.8  NEUTROABS 8.6*  --   --   --   HGB 10.6* 8.8* 9.0* 9.6*  HCT 36.7 26.1* 25.0* 28.0*  MCV 112.2* 95.6 89.6 93.6  PLT 192 156 153 163   Cardiac Enzymes:  Recent Labs Lab 11/10/14 1610 11/10/14 2045 11/11/14 0245 11/11/14 0839 11/12/14 0028  TROPONINI 0.08* 0.19* 0.24* 0.18* 0.13*   CBG:  Recent Labs Lab 11/12/14 0517 11/12/14 0639 11/12/14 0738 11/12/14 0836 11/12/14 0939  GLUCAP 87 95 165* 219* 227*    Recent Results (from the past 240 hour(s))  MRSA PCR Screening     Status: None   Collection Time: 11/10/14  4:00 PM  Result Value Ref Range Status   MRSA by PCR NEGATIVE NEGATIVE Final    Comment:        The GeneXpert MRSA Assay (FDA approved for NASAL specimens only), is one component of a comprehensive MRSA colonization surveillance program. It is not intended to diagnose MRSA infection nor to guide or monitor treatment for MRSA infections.   Culture, blood (routine x 2)     Status: None (Preliminary result)   Collection Time: 11/10/14  4:35 PM  Result Value Ref Range Status   Specimen Description BLOOD RIGHT HAND  Final   Special Requests BOTTLES DRAWN AEROBIC ONLY 3CC  Final   Culture NO GROWTH < 24 HOURS  Final   Report Status PENDING  Incomplete  Culture, blood (routine x 2)     Status: None (Preliminary result)   Collection Time: 11/10/14  7:43 PM  Result Value Ref Range Status   Specimen Description BLOOD RIGHT ARM  Final   Special Requests BOTTLES DRAWN AEROBIC ONLY 3CC  Final   Culture NO GROWTH < 12 HOURS  Final   Report Status PENDING  Incomplete      Studies/Results: Dg Chest Port 1 View  11/10/2014   CLINICAL DATA:  Dyspnea  EXAM: PORTABLE CHEST - 1 VIEW  COMPARISON:  08/04/2014  FINDINGS:  Chronic cardiomegaly, accentuated by hypoventilation and portable technique. Stable aortic and hilar contours, distorted from technique.  Interstitial and hazy density crowding at the bases without definite pneumonia or edema. No effusion or pneumothorax.  IMPRESSION: 1. Bibasilar opacity is likely atelectasis given hypoventilation. No edema or definitive pneumonia. 2. Chronic cardiomegaly.   Electronically Signed   By: Monte Fantasia M.D.   On: 11/10/2014 10:06    Medications:  Scheduled: . aspirin EC  325 mg Oral Daily  . calcium acetate  667 mg Oral q12n4p  . doxercalciferol  3 mcg Intravenous Q T,Th,Sa-HD  . gabapentin  100 mg Oral BID  . heparin  5,000 Units Subcutaneous 3 times per day  . insulin aspart  0-5 Units Subcutaneous QHS  . insulin aspart  0-9 Units Subcutaneous TID WC  . insulin glargine  10 Units Subcutaneous Q24H  . metoCLOPramide (REGLAN) injection  5 mg Intravenous TID AC  . metoprolol succinate  100 mg Oral Daily  . multivitamin  1 tablet Oral QHS  . pantoprazole  40 mg Oral Q1200   Continuous:  PPI:[id number], feeding supplement (NEPRO CARB STEADY), heparin, lidocaine (PF), lidocaine-prilocaine, pentafluoroprop-tetrafluoroeth  Assessment/Plan:  Principal Problem:   DKA, type 1 Active Problems:   History of simultaneous kidney and pancreas transplant   Gastroparesis   End-stage renal disease on hemodialysis   HTN (hypertension)    DKA in Setting of Uncontrolled Type 1 DM Anion gap has closed. Still has significant nausea. She is noted to be on Reglan at home. They could be a component of diabetic gastroparesis. We will place her on intravenous Reglan. We will transition her to subcutaneous insulin. Repeat basic metabolic panel this afternoon and then tomorrow.   ESRD on HD, Secondary Hyperparathyroidism Patient on T/Th/Sat schedule. Nephrology is following.   Atypical Chest Pain. Minimally elevated troponins Patient had reported left-sided chest pain  on admission that she described as 8/10 in severity, substernal, achy, and non-radiating. EKG without ST changes on admission. Repeat EKG with T wave changes that are old compared to prior. Troponins minimally elevated up to 0.24 but trending down. Likely related to ESRD. Chest pain could have been GI in origin considering her nausea and vomiting. No complaints this morning. Continue to monitor. Continue PPI.   Hyponatremia Name level has improved. Continue to watch for now. Stop IV fluids.   History of essential hypertension  BP stable. And tinea home medications.  Normocytic Anemia Likely due to chronic disease. Hemoglobin stable. No evidence for bleeding.  Leukocytosis WBC count was elevated yesterday but normal today. She is afebrile. Cultures are all negative. Likely reactive. Hold off on antibiotics.  DVT Prophylaxis: Subcutaneous heparin    Code Status: Full code  Family Communication: Discussed with the patient  Disposition Plan: Transition off of IV insulin today. Control nausea. Possible discharge tomorrow.  Follow-up Appointment?: with her PCP in 1 week post DC.   LOS: 2 days   [facility] Pager [phone] 11/12/2014, 9:59 AM  If 7PM-7AM, please contact night-coverage at [email or web address], password [facility]

## 2014-11-13 DIAGNOSIS — N189 Chronic kidney disease, unspecified: Secondary | ICD-10-CM

## 2014-11-13 DIAGNOSIS — E1022 Type 1 diabetes mellitus with diabetic chronic kidney disease: Secondary | ICD-10-CM

## 2014-11-13 DIAGNOSIS — R1310 Dysphagia, unspecified: Secondary | ICD-10-CM

## 2014-11-13 LAB — CBC
HEMATOCRIT: 27.7 % — AB (ref 36.0–46.0)
Hemoglobin: 9.5 g/dL — ABNORMAL LOW (ref 12.0–15.0)
MCH: 31.6 pg (ref 26.0–34.0)
MCHC: 34.3 g/dL (ref 30.0–36.0)
MCV: 92 fL (ref 78.0–100.0)
Platelets: 145 10*3/uL — ABNORMAL LOW (ref 150–400)
RBC: 3.01 MIL/uL — ABNORMAL LOW (ref 3.87–5.11)
RDW: 14 % (ref 11.5–15.5)
WBC: 6.7 10*3/uL (ref 4.0–10.5)

## 2014-11-13 LAB — RENAL FUNCTION PANEL
ANION GAP: 14 (ref 5–15)
Albumin: 3 g/dL — ABNORMAL LOW (ref 3.5–5.0)
BUN: 42 mg/dL — AB (ref 6–20)
CALCIUM: 8 mg/dL — AB (ref 8.9–10.3)
CHLORIDE: 95 mmol/L — AB (ref 101–111)
CO2: 20 mmol/L — AB (ref 22–32)
Creatinine, Ser: 9.48 mg/dL — ABNORMAL HIGH (ref 0.44–1.00)
GFR, EST AFRICAN AMERICAN: 5 mL/min — AB (ref >60)
GFR, EST NON AFRICAN AMERICAN: 5 mL/min — AB (ref >60)
GLUCOSE: 209 mg/dL — AB (ref 65–99)
Phosphorus: 4.5 mg/dL (ref 2.5–4.6)
Potassium: 3.8 mmol/L (ref 3.5–5.1)
Sodium: 129 mmol/L — ABNORMAL LOW (ref 135–145)

## 2014-11-13 LAB — GLUCOSE, CAPILLARY
GLUCOSE-CAPILLARY: 154 mg/dL — AB (ref 65–99)
GLUCOSE-CAPILLARY: 262 mg/dL — AB (ref 65–99)
Glucose-Capillary: 86 mg/dL (ref 65–99)

## 2014-11-13 MED ORDER — PROMETHAZINE HCL 25 MG/ML IJ SOLN
12.5000 mg | Freq: Four times a day (QID) | INTRAMUSCULAR | Status: DC | PRN
  Administered 2014-11-13 – 2014-11-16 (×6): 12.5 mg via INTRAVENOUS
  Filled 2014-11-13 (×4): qty 1

## 2014-11-13 MED ORDER — SUCRALFATE 1 GM/10ML PO SUSP
1.0000 g | Freq: Three times a day (TID) | ORAL | Status: DC
  Administered 2014-11-13 – 2014-11-16 (×8): 1 g via ORAL
  Filled 2014-11-13 (×16): qty 10

## 2014-11-13 MED ORDER — LIDOCAINE HCL (PF) 1 % IJ SOLN: 5.0000 mL | INTRAMUSCULAR | Status: DC | PRN

## 2014-11-13 MED ORDER — PANTOPRAZOLE SODIUM 40 MG PO TBEC
40.0000 mg | DELAYED_RELEASE_TABLET | Freq: Two times a day (BID) | ORAL | Status: DC
  Administered 2014-11-13: 40 mg via ORAL
  Filled 2014-11-13 (×2): qty 1

## 2014-11-13 MED ORDER — ONDANSETRON HCL 4 MG/2ML IJ SOLN
4.0000 mg | Freq: Four times a day (QID) | INTRAMUSCULAR | Status: DC | PRN
  Administered 2014-11-13 (×2): 4 mg via INTRAVENOUS
  Filled 2014-11-13 (×2): qty 2

## 2014-11-13 MED ORDER — METOCLOPRAMIDE HCL 5 MG PO TABS
5.0000 mg | ORAL_TABLET | Freq: Three times a day (TID) | ORAL | Status: DC
  Administered 2014-11-13 (×3): 5 mg via ORAL
  Filled 2014-11-13 (×8): qty 1

## 2014-11-13 MED ORDER — LIDOCAINE-PRILOCAINE 2.5-2.5 % EX CREA: 1.0000 "application " | TOPICAL_CREAM | CUTANEOUS | Status: DC | PRN

## 2014-11-13 MED ORDER — PENTAFLUOROPROP-TETRAFLUOROETH EX AERO
INHALATION_SPRAY | CUTANEOUS | Status: AC
  Filled 2014-11-13: qty 103.5

## 2014-11-13 MED ORDER — SODIUM CHLORIDE 0.9 % IV SOLN: 100.0000 mL | INTRAVENOUS | Status: DC | PRN

## 2014-11-13 MED ORDER — PENTAFLUOROPROP-TETRAFLUOROETH EX AERO: 1.0000 "application " | INHALATION_SPRAY | CUTANEOUS | Status: DC | PRN

## 2014-11-13 MED ORDER — DOXERCALCIFEROL 4 MCG/2ML IV SOLN
INTRAVENOUS | Status: AC
  Administered 2014-11-13: 3 ug
  Filled 2014-11-13: qty 2

## 2014-11-13 MED ORDER — ALTEPLASE 2 MG IJ SOLR
2.0000 mg | Freq: Once | INTRAMUSCULAR | Status: DC | PRN
  Filled 2014-11-13: qty 2

## 2014-11-13 MED ORDER — PROMETHAZINE HCL 25 MG/ML IJ SOLN
INTRAMUSCULAR | Status: AC
  Administered 2014-11-13: 12.5 mg via INTRAVENOUS
  Filled 2014-11-13: qty 1

## 2014-11-13 NOTE — Progress Notes (Signed)
Report given to dialysis RN. Will continue to monitor.

## 2014-11-13 NOTE — Progress Notes (Signed)
Pt's diet advanced to carb mod from full liquid. Pt ate soup from tray and had an emesis episode in the restroom. Unable to see characteristics of emesis. Zofran given. Will continue to monitor.

## 2014-11-13 NOTE — Plan of Care (Signed)
Patient was in dialysis for morning med times. Caught up meds as needed at 1300.

## 2014-11-13 NOTE — Procedures (Signed)
Reports nausea today.  Plan HD w UF to dry wt, no new suggestions. Exam unchanged.  I was present at this dialysis session, have reviewed the session itself and made  appropriate changes Kelly Splinter MD (pgr) [phone]    ([phone] 11/13/2014, 10:31 AM

## 2014-11-13 NOTE — Progress Notes (Signed)
[facility] PROGRESS NOTE  [name] RSW:[id number] DOB: 08/04/1970 DOA: 11/10/2014  PCP: [name], MD  Brief HPI: 44 year old with a as medical history of end-stage renal disease on dialysis, type 1 diabetes, hypertension, previous hospitalizations for DKA presented with a four-day history of elevated blood sugars and generalized fatigue, nausea and vomiting. Blood glucose level was 1659 at admission. She had an elevated anion gap. She was admitted to the intensive care unit and started on insulin. Subsequently, was transferred out to the floor.   Past medical history:  Past Medical History  Diagnosis Date  . Hyperlipidemia   . Alopecia   . Obesity   . Iron deficiency   . ESRD (end stage renal disease)     S/p pancreatic and kidney transplant, but back on HD 2008, dialysis at [facility], [place], Sat  . RLS (restless legs syndrome)   . Injury of conjunctiva and corneal abrasion of right eye without foreign body   . Cellulitis   . Anemia   . Blood transfusion 2004    "when I had my transplant"  . Migraine   . Hyperparathyroidism, secondary   . Diabetic retinopathy   . Hypertension     takes Metoprolol and Exforge daily  . Depression     takes Klonopin nightly  . Diabetes mellitus type 1     Pt states diagnosed at age 24 with prior episodes of DKA. S/p pancreatic transplant   . GERD (gastroesophageal reflux disease)     takes Protonix daily  . Bronchitis   . Migraine   . Asthma   . Emphysema of lung   . Angina     none in past 2 years.  . Stomach pain     Hx: of chronic  . Pneumonia 2012    "double"  . Dialysis patient     tues,thurs,sat  . 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  Surgery was done at [facility].        Consultants: Nephrology  Procedures: Hemodialysis  Antibiotics: None  Subjective: Patient continues to have nausea. She vomited overnight. She complains of difficulty with swallowing and developed chest pain especially when she swallows. This has been ongoing despite the last 2-3 days.   Objective: Vital Signs  Filed Vitals:   11/13/14 0900 11/13/14 0930 11/13/14 1000 11/13/14 1028  BP: 129/69 140/72 124/67 135/71  Pulse: 77 79 79 83  Temp:    98 F (36.7 C)  TempSrc:    Oral  Resp:    15  Height:      Weight:      SpO2:    100%    Intake/Output Summary (Last 24 hours) at 11/13/14 1101 Last data filed at 11/13/14 1028  Gross per 24 hour  Intake      0 ml  Output   2505 ml  Net  -2505 ml   Filed Weights   11/11/14 1155 11/11/14 1710 11/13/14 0652  Weight: 70.6 kg (155 lb 10.3 oz) 67.7 kg (149 lb 4 oz) 71.4 kg (157 lb 6.5 oz)    General appearance: alert, cooperative, appears stated age and no distress Resp: clear to auscultation bilaterally Cardio: regular rate and rhythm, S1, S2 normal, no murmur, click, rub or gallop GI: soft, non-tender; bowel sounds  normal; no masses,  no organomegaly Neurologic: No focal deficits.  Lab Results:  Basic Metabolic Panel:  Recent Labs Lab 11/10/14 1055  11/11/14 0837  11/11/14 1941 11/12/14 0028 11/12/14 0400 11/12/14 0818 11/13/14 0730  NA 109*  < > 124*  < > 131* 130* 131* 130* 129*  K >7.5*  < > 3.9  < > 3.5 3.8 3.2* 3.6 3.8  CL 72*  < > 88*  < > 98* 96* 97* 96* 95*  CO2 8*  < > 17*  < > 21* 19* 18* 20* 20*  GLUCOSE 1638*  < > 101*  < > 60* 133* 158* 220* 209*  BUN 55*  < > 57*  < > 30* 31* 32* 32* 42*  CREATININE 7.90*  < > 8.86*  < > 6.12* 6.64* 7.13* 7.15* 9.48*  CALCIUM 6.2*  < > 7.3*  < > 8.5* 8.3* 8.2* 8.2* 8.0*  MG 1.9  --   --   --   --   --   --   --   --   PHOS 10.6*  --  4.9*  --   --   --   --   --  4.5  < > = values in this interval not displayed. Liver Function Tests:  Recent Labs Lab  11/11/14 0837 11/13/14 0730  ALBUMIN 3.0* 3.0*   CBC:  Recent Labs Lab 11/10/14 0908 11/10/14 1610 11/11/14 0836 11/12/14 0400 11/13/14 0730  WBC 10.1 9.7 11.6* 9.8 6.7  NEUTROABS 8.6*  --   --   --   --   HGB 10.6* 8.8* 9.0* 9.6* 9.5*  HCT 36.7 26.1* 25.0* 28.0* 27.7*  MCV 112.2* 95.6 89.6 93.6 92.0  PLT 192 156 153 163 145*   Cardiac Enzymes:  Recent Labs Lab 11/10/14 1610 11/10/14 2045 11/11/14 0245 11/11/14 0839 11/12/14 0028  TROPONINI 0.08* 0.19* 0.24* 0.18* 0.13*   CBG:  Recent Labs Lab 11/12/14 0939 11/12/14 1058 11/12/14 1210 11/12/14 1655 11/12/14 2205  GLUCAP 227* 167* 103* 224* 116*    Recent Results (from the past 240 hour(s))  MRSA PCR Screening     Status: None   Collection Time: 11/10/14  4:00 PM  Result Value Ref Range Status   MRSA by PCR NEGATIVE NEGATIVE Final    Comment:        The GeneXpert MRSA Assay (FDA approved for NASAL specimens only), is one component of a comprehensive MRSA colonization surveillance program. It is not intended to diagnose MRSA infection nor to guide or monitor treatment for MRSA infections.   Culture, blood (routine x 2)     Status: None (Preliminary result)   Collection Time: 11/10/14  4:35 PM  Result Value Ref Range Status   Specimen Description BLOOD RIGHT HAND  Final   Special Requests BOTTLES DRAWN AEROBIC ONLY 3CC  Final   Culture NO GROWTH 2 DAYS  Final   Report Status PENDING  Incomplete  Culture, blood (routine x 2)     Status: None (Preliminary result)   Collection Time: 11/10/14  7:43 PM  Result Value Ref Range Status   Specimen Description BLOOD RIGHT ARM  Final   Special Requests BOTTLES DRAWN AEROBIC ONLY 3CC  Final   Culture NO GROWTH 2 DAYS  Final   Report Status PENDING  Incomplete      Studies/Results: No results found.  Medications:  Scheduled: . aspirin EC  325 mg Oral Daily  . calcium acetate  667 mg Oral q12n4p  .  doxercalciferol  3 mcg Intravenous Q T,Th,Sa-HD  .  gabapentin  100 mg Oral BID  . heparin  5,000 Units Subcutaneous 3 times per day  . insulin aspart  0-5 Units Subcutaneous QHS  . insulin aspart  0-9 Units Subcutaneous TID WC  . insulin glargine  10 Units Subcutaneous Q24H  . metoCLOPramide  5 mg Oral TID AC & HS  . metoprolol succinate  100 mg Oral Daily  . multivitamin  1 tablet Oral QHS  . pantoprazole  40 mg Oral BID AC  . pentafluoroprop-tetrafluoroeth      . sucralfate  1 g Oral TID WC & HS   Continuous:  SMO:[id number], feeding supplement (NEPRO CARB STEADY), heparin, lidocaine (PF), lidocaine-prilocaine, ondansetron, pentafluoroprop-tetrafluoroeth, promethazine, zolpidem  Assessment/Plan:  Principal Problem:   DKA, type 1 Active Problems:   History of simultaneous kidney and pancreas transplant   Gastroparesis   End-stage renal disease on hemodialysis   HTN (hypertension)    DKA in Setting of Uncontrolled Type 1 DM Patient was transitioned to subcutaneous insulin yesterday. Blood work this morning shows anion gap remains closed.   Significant nausea with possible dysphagia/odynophagia/diabetic gastroparesis Patient continues to have significant nausea. And she tells me that she is having difficulty with swallowing food and liquids. She developed chest pain whenever she does so. She was started on intravenous Reglan yesterday. We will proceed with Barium Swallow. There is no evidence for oral candidiasis. Change to oral Reglan. Increase PPI to twice a day. Add Carafate.  ESRD on HD, Secondary Hyperparathyroidism Patient on T/Th/Sat schedule. Nephrology is following.   Atypical Chest Pain. Minimally elevated troponins Patient had reported left-sided chest pain on admission. EKG without ST changes on admission. Repeat EKG with T wave changes that are old compared to prior. Troponins minimally elevated up to 0.24 but trending down. Likely related to ESRD. Chest pain is likely GI in origin. See above.    Hyponatremia Stable. Continue to watch for now.  History of essential hypertension  BP stable. Continue home medications.  Normocytic Anemia Likely due to chronic disease. Hemoglobin stable. No evidence for bleeding.  Leukocytosis WBC count was elevated yesterday but normal subsequently. She is afebrile. Cultures are all negative. Likely reactive. Hold off on antibiotics.  DVT Prophylaxis: Subcutaneous heparin    Code Status: Full code  Family Communication: Discussed with the patient  Disposition Plan: Barium swallow today.   Follow-up Appointment?: with her PCP in 1 week post DC.   LOS: 3 days   [facility] Pager [phone] 11/13/2014, 11:01 AM  If 7PM-7AM, please contact night-coverage at [email or web address], password [facility]

## 2014-11-14 LAB — BASIC METABOLIC PANEL
Anion gap: 11 (ref 5–15)
BUN: 21 mg/dL — AB (ref 6–20)
CO2: 26 mmol/L (ref 22–32)
Calcium: 8.6 mg/dL — ABNORMAL LOW (ref 8.9–10.3)
Chloride: 99 mmol/L — ABNORMAL LOW (ref 101–111)
Creatinine, Ser: 6.65 mg/dL — ABNORMAL HIGH (ref 0.44–1.00)
GFR calc non Af Amer: 7 mL/min — ABNORMAL LOW (ref >60)
GFR, EST AFRICAN AMERICAN: 8 mL/min — AB (ref >60)
Glucose, Bld: 91 mg/dL (ref 65–99)
Potassium: 3.6 mmol/L (ref 3.5–5.1)
Sodium: 136 mmol/L (ref 135–145)

## 2014-11-14 LAB — GLUCOSE, CAPILLARY
Glucose-Capillary: 154 mg/dL — ABNORMAL HIGH (ref 65–99)
Glucose-Capillary: 79 mg/dL (ref 65–99)
Glucose-Capillary: 325 mg/dL — ABNORMAL HIGH (ref 65–99)
Glucose-Capillary: 141 mg/dL — ABNORMAL HIGH (ref 65–99)

## 2014-11-14 MED ORDER — PANTOPRAZOLE SODIUM 40 MG IV SOLR
40.0000 mg | Freq: Two times a day (BID) | INTRAVENOUS | Status: DC
  Administered 2014-11-14: 40 mg via INTRAVENOUS
  Filled 2014-11-14 (×4): qty 40

## 2014-11-14 MED ORDER — METOCLOPRAMIDE HCL 5 MG/ML IJ SOLN
5.0000 mg | Freq: Three times a day (TID) | INTRAMUSCULAR | Status: DC
  Administered 2014-11-14 – 2014-11-15 (×3): 5 mg via INTRAVENOUS
  Filled 2014-11-14 (×7): qty 1

## 2014-11-14 MED ORDER — METOPROLOL TARTRATE 1 MG/ML IV SOLN
10.0000 mg | Freq: Four times a day (QID) | INTRAVENOUS | Status: DC
  Administered 2014-11-14 – 2014-11-16 (×8): 10 mg via INTRAVENOUS
  Filled 2014-11-14 (×12): qty 10

## 2014-11-14 NOTE — Progress Notes (Signed)
[facility] Progress Note  Assessment/Plan: 1. DKA- BS's stable. N/V better today. Feeling better.  2. ESRD - TTS HD 3. Hypertension/volume - 142/68.,  Under her dry wt 1-2kg 4. Anemia - HGB 9.6 On ESA at HD center.  5. Metabolic bone disease - Ca+ 7.5  Leave on 2.5 Ca bath tomorrow. 6. Nutrition - renal diet when advanced. multivit 7. Prolonged bleeding from AVG- consult [facility]- has appt pending 6/16 - last access flow 1743 and last intervention 4/20 at [facility] with Dr Jacquelin Hawking MD pager [phone]    cell [phone] 11/14/2014, 11:49 AM    Subjective:   " I feel better", n/v has improved. And BS are not out of control  Objective Filed Vitals:   11/13/14 1645 11/13/14 2021 11/14/14 0000 11/14/14 0419  BP: 138/62 132/60 110/46 133/58  Pulse: 77 81 80 77  Temp: 99.3 F (37.4 C) 99.3 F (37.4 C) 99.6 F (37.6 C) 99.5 F (37.5 C)  TempSrc: Oral Oral Oral Oral  Resp: 16 18 18 18   Height:      Weight:      SpO2: 95% 94% 93% 92%   Physical Exam General: Well nourished female in no acute distress Heart: RRR, No M/G/R Lungs:CTA Anteriorly and posteriorly. Abdomen: soft, nontender. Active BS x 4 quadrants. Extremities: No peripheral edema Neuro: A& O X3.  Dialysis Access: R. Thigh AVG  Dialysis Orders: TTS NW 3hr 45 min 69kgs 2K/2.5Ca+ No heparin R thigh AVG hectorol 3 Micera 100 q 2 weeks last dose given 6/9  Additional Objective Labs: Basic Metabolic Panel:  Recent Labs Lab 11/10/14 1055  11/11/14 0837  11/12/14 0818 11/13/14 0730 11/14/14 0350  NA 109*  < > 124*  < > 130* 129* 136  K >7.5*  < > 3.9  < > 3.6 3.8 3.6  CL 72*  < > 88*  < > 96* 95* 99*  CO2 8*  < > 17*  < > 20* 20* 26  GLUCOSE 1638*  < > 101*  < > 220* 209* 91  BUN 55*  < > 57*  < > 32* 42* 21*  CREATININE 7.90*  < > 8.86*  < > 7.15* 9.48* 6.65*  CALCIUM 6.2*  < > 7.3*  < > 8.2* 8.0* 8.6*  PHOS 10.6*  --  4.9*  --   --  4.5  --   < > = values in this interval  not displayed. Liver Function Tests:  Recent Labs Lab 11/11/14 0837 11/13/14 0730  ALBUMIN 3.0* 3.0*   No results for input(s): LIPASE, AMYLASE in the last 168 hours. CBC:  Recent Labs Lab 11/10/14 0908 11/10/14 1610 11/11/14 0836 11/12/14 0400 11/13/14 0730  WBC 10.1 9.7 11.6* 9.8 6.7  NEUTROABS 8.6*  --   --   --   --   HGB 10.6* 8.8* 9.0* 9.6* 9.5*  HCT 36.7 26.1* 25.0* 28.0* 27.7*  MCV 112.2* 95.6 89.6 93.6 92.0  PLT 192 156 153 163 145*   Blood Culture    Component Value Date/Time   SDES BLOOD RIGHT ARM 11/10/2014 1943   SPECREQUEST BOTTLES DRAWN AEROBIC ONLY 3CC 11/10/2014 1943   CULT NO GROWTH 3 DAYS 11/10/2014 1943   REPTSTATUS PENDING 11/10/2014 1943    Cardiac Enzymes:  Recent Labs Lab 11/10/14 1610 11/10/14 2045 11/11/14 0245 11/11/14 0839 11/12/14 0028  TROPONINI 0.08* 0.19* 0.24* 0.18* 0.13*   CBG:  Recent Labs Lab 11/12/14 2205 11/13/14 1213 11/13/14 1643 11/13/14 2123 11/14/14  0808  GLUCAP 116* 154* 86 262* 154*   Iron Studies: No results for input(s): IRON, TIBC, TRANSFERRIN, FERRITIN in the last 72 hours. @lablastinr3 @ Studies/Results: No results found. Medications:   . aspirin EC  325 mg Oral Daily  . calcium acetate  667 mg Oral q12n4p  . doxercalciferol  3 mcg Intravenous Q T,Th,Sa-HD  . gabapentin  100 mg Oral BID  . heparin  5,000 Units Subcutaneous 3 times per day  . insulin aspart  0-5 Units Subcutaneous QHS  . insulin aspart  0-9 Units Subcutaneous TID WC  . insulin glargine  10 Units Subcutaneous Q24H  . metoCLOPramide  5 mg Oral TID AC & HS  . metoprolol succinate  100 mg Oral Daily  . multivitamin  1 tablet Oral QHS  . pantoprazole  40 mg Oral BID AC  . sucralfate  1 g Oral TID WC & HS

## 2014-11-14 NOTE — Progress Notes (Signed)
Utilization Review completed. Faizan Geraci RN BSN CM 

## 2014-11-14 NOTE — Progress Notes (Signed)
Patient ID: [name], female   DOB: [birth year], 44 y.o.   MRN: [id number]  [facility] PROGRESS NOTE  [name] MGN:[id number] DOB: 25-Jul-1970 DOA: Dec 03, 2014 PCP: [name], MD   Brief narrative:    44 year old female with ESRD, type I DM, HTN, history of hospitalizations for DKA, presented to [facility] ED with main concern of several days duration of progressively worsening nausea and non bloody vomiting. Poor oral intake and elevated sugars > 300. In ED, pt was found to be in DKA and TSH asked to admit for further evaluation. Initially admitted to ICU and transferred to telemetry bed once off insulin drip. Hospital course complicated by persistent N/V despite resolution of DKA.   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  Family Communication:  plan of care discussed with the patient Disposition Plan: Home in 24 - 48 hours, after barium swallow done   IV access:  Right thigh AVG  Procedures and diagnostic studies:    Dg Chest Port 1 View 2014/12/03 Bibasilar opacity is likely atelectasis given hypoventilation.  No edema or definitive pneumonia.   Medical Consultants:  Nephrology   Other Consultants:  None  IAnti-Infectives:   None  [name] Ramsay, MD  [facility] Pager [phone]  If 7PM-7AM, please contact night-coverage [email or web address] Password [email or web address] 11/14/2014, 12:10 PM   LOS: 4 days   HPI/Subjective: No events overnight.   Objective: Filed Vitals:   11/13/14 1645 11/13/14 2021 11/14/14 0000 11/14/14 0419  BP: 138/62 132/60 110/46 133/58  Pulse: 77 81 80 77  Temp: 99.3 F (37.4 C) 99.3 F (37.4 C) 99.6 F (37.6 C) 99.5 F (37.5 C)  TempSrc: Oral Oral Oral Oral  Resp: 16 18 18 18   Height:      Weight:      SpO2: 95% 94% 93% 92%   No intake or output data in the 24 hours ending 11/14/14 1210  Exam:   General:  Pt is alert, follows commands appropriately, not in acute distress  Cardiovascular: Regular rate and rhythm, S1/S2, no murmurs, no rubs, no gallops  Respiratory: Clear to auscultation bilaterally, no wheezing, no crackles, no rhonchi  Abdomen: Soft, non tender, non distended, bowel sounds present, no guarding  Extremities: No edema   Data Reviewed: Basic Metabolic Panel:  Recent Labs Lab 12/03/14 1055  11/11/14 0837  11/12/14 0028 11/12/14 0400 11/12/14 0818 11/13/14 0730 11/14/14 0350  NA 109*  < > 124*  < > 130* 131* 130* 129* 136  K >7.5*  < > 3.9  < > 3.8 3.2* 3.6 3.8 3.6  CL 72*  < > 88*  < > 96* 97* 96* 95* 99*  CO2 8*  < > 17*  < > 19* 18* 20* 20* 26  GLUCOSE 1638*  < > 101*  < > 133* 158* 220* 209* 91  BUN 55*  < > 57*  < > 31* 32* 32* 42* 21*  CREATININE 7.90*  < > 8.86*  < > 6.64* 7.13* 7.15* 9.48* 6.65*  CALCIUM 6.2*  < > 7.3*  < > 8.3* 8.2* 8.2* 8.0* 8.6*  MG 1.9  --   --   --   --   --   --   --   --   PHOS 10.6*  --  4.9*  --   --   --   --  4.5  --   < > = values in this interval not displayed. Liver Function Tests:  Recent Labs Lab 11/11/14 0837 11/13/14 0730  ALBUMIN 3.0* 3.0*   No results for input(s): LIPASE, AMYLASE in  the last 168 hours. No results for input(s): AMMONIA in the last 168 hours. CBC:  Recent Labs Lab 11/10/14 0908 11/10/14 1610 11/11/14 0836 11/12/14 0400 11/13/14 0730  WBC 10.1 9.7 11.6* 9.8 6.7  NEUTROABS 8.6*  --   --   --   --   HGB 10.6* 8.8* 9.0* 9.6* 9.5*  HCT 36.7 26.1* 25.0* 28.0* 27.7*  MCV 112.2* 95.6 89.6 93.6 92.0  PLT 192 156 153 163 145*   Cardiac Enzymes:  Recent Labs Lab 11/10/14 1610 11/10/14 2045 11/11/14 0245 11/11/14 0839 11/12/14 0028  TROPONINI 0.08* 0.19* 0.24* 0.18* 0.13*   BNP: Invalid input(s): POCBNP CBG:  Recent Labs Lab 11/13/14 1213 11/13/14 1643 11/13/14 2123 11/14/14 0808 11/14/14 1150  GLUCAP 154* 86 262* 154* 79    Recent Results (from the past 240 hour(s))  MRSA PCR Screening     Status: None   Collection Time: 11/10/14  4:00 PM  Result Value Ref Range Status   MRSA by PCR NEGATIVE NEGATIVE Final    Comment:        The GeneXpert MRSA Assay (FDA approved for NASAL specimens only), is one component of a comprehensive MRSA colonization surveillance program. It is not intended to diagnose MRSA infection nor to guide or monitor treatment for MRSA infections.   Culture, blood (routine x 2)     Status: None (Preliminary result)   Collection Time: 11/10/14  4:35 PM  Result Value Ref Range Status   Specimen Description BLOOD RIGHT HAND  Final   Special Requests BOTTLES DRAWN AEROBIC ONLY 3CC  Final   Culture NO GROWTH 3 DAYS  Final   Report Status PENDING  Incomplete  Culture, blood (routine x 2)     Status: None (Preliminary result)   Collection Time: 11/10/14  7:43 PM  Result Value Ref Range Status   Specimen Description BLOOD RIGHT ARM  Final   Special Requests BOTTLES DRAWN AEROBIC ONLY 3CC  Final   Culture NO GROWTH 3 DAYS  Final   Report Status PENDING  Incomplete     Scheduled Meds: . aspirin EC  325 mg Oral Daily  . calcium acetate  667 mg Oral q12n4p  . doxercalciferol  3 mcg Intravenous Q T,Th,Sa-HD  .  gabapentin  100 mg Oral BID  . heparin  5,000 Units Subcutaneous 3 times per day  . insulin aspart  0-5 Units Subcutaneous QHS  . insulin aspart  0-9 Units Subcutaneous TID WC  . insulin glargine  10 Units Subcutaneous Q24H  . metoCLOPramide  5 mg Oral TID AC & HS  . metoprolol succinate  100 mg Oral Daily  . multivitamin  1 tablet Oral QHS  . pantoprazole  40 mg Oral BID AC  . sucralfate  1 g Oral TID WC & HS   Continuous Infusions:

## 2014-11-15 ENCOUNTER — Inpatient Hospital Stay (HOSPITAL_COMMUNITY): Payer: Medicare Other

## 2014-11-15 LAB — BASIC METABOLIC PANEL
ANION GAP: 12 (ref 5–15)
BUN: 30 mg/dL — AB (ref 6–20)
CO2: 24 mmol/L (ref 22–32)
CREATININE: 9.41 mg/dL — AB (ref 0.44–1.00)
Calcium: 8.3 mg/dL — ABNORMAL LOW (ref 8.9–10.3)
Chloride: 97 mmol/L — ABNORMAL LOW (ref 101–111)
GFR calc Af Amer: 5 mL/min — ABNORMAL LOW (ref >60)
GFR calc non Af Amer: 5 mL/min — ABNORMAL LOW (ref >60)
Glucose, Bld: 75 mg/dL (ref 65–99)
Potassium: 4.4 mmol/L (ref 3.5–5.1)
Sodium: 133 mmol/L — ABNORMAL LOW (ref 135–145)

## 2014-11-15 LAB — CULTURE, BLOOD (ROUTINE X 2)
CULTURE: NO GROWTH
Culture: NO GROWTH

## 2014-11-15 LAB — GLUCOSE, CAPILLARY
GLUCOSE-CAPILLARY: 147 mg/dL — AB (ref 65–99)
GLUCOSE-CAPILLARY: 393 mg/dL — AB (ref 65–99)
Glucose-Capillary: 66 mg/dL (ref 65–99)
Glucose-Capillary: 69 mg/dL (ref 65–99)

## 2014-11-15 MED ORDER — PANTOPRAZOLE SODIUM 40 MG PO TBEC
40.0000 mg | DELAYED_RELEASE_TABLET | Freq: Two times a day (BID) | ORAL | Status: DC
  Administered 2014-11-15 – 2014-11-16 (×2): 40 mg via ORAL
  Filled 2014-11-15 (×3): qty 1

## 2014-11-15 MED ORDER — METOCLOPRAMIDE HCL 5 MG PO TABS
5.0000 mg | ORAL_TABLET | Freq: Three times a day (TID) | ORAL | Status: DC
  Administered 2014-11-15 – 2014-11-16 (×4): 5 mg via ORAL
  Filled 2014-11-15 (×8): qty 1

## 2014-11-15 NOTE — Progress Notes (Signed)
Patient ID: [name], female   DOB: 1970-07-23, 44 y.o.   MRN: [id number]  [facility] PROGRESS NOTE  TWANIA BUJAK ULA:[id number] DOB: 26-May-1971 DOA: 11/10/2014 PCP: [name], MD   Brief narrative:    44 year old female with ESRD, type I DM, HTN, history of hospitalizations for DKA, presented to [facility] ED with main concern of several days duration of progressively worsening nausea and non bloody vomiting. Poor oral intake and elevated sugars > 300. In ED, pt was found to be in DKA and TSH asked to admit for further evaluation. Initially admitted to ICU and transferred to telemetry bed once off insulin drip. Hospital course complicated by persistent N/V despite resolution of DKA.   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  Family Communication:  plan of care discussed with the patient Disposition Plan: Home in 24 - 48 hours, after barium swallow done   IV access:  Right thigh AVG  Procedures and diagnostic studies:    Dg Chest Port  1 View 11/10/2014 Bibasilar opacity is likely atelectasis given hypoventilation. No edema or definitive pneumonia.   Medical Consultants:  Nephrology   Other Consultants:  None  IAnti-Infectives:   None  Faye Ramsay, MD  [facility] Pager [phone]  If 7PM-7AM, please contact night-coverage [email or web address] Password [email or web address] 11/15/2014, 1:57 PM   LOS: 5 days   HPI/Subjective: No events overnight. Reports persistent dysphagia to solids ans liquids   Objective: Filed Vitals:   11/15/14 0740 11/15/14 1202 11/15/14 1205 11/15/14 1235  BP: 144/62  151/69 134/62  Pulse: 74 34 74   Temp: 98.3 F (36.8 C) 98.2 F (36.8 C)    TempSrc: Oral Oral    Resp: 18 20    Height:      Weight:      SpO2: 95% 94%      Intake/Output Summary (Last 24 hours) at 11/15/14 1357 Last data filed at 11/15/14 1100  Gross per 24 hour  Intake      0 ml  Output      0 ml  Net      0 ml    Exam:   General:  Pt is alert, follows commands appropriately, not in acute distress  Cardiovascular: Regular rate and rhythm, S1/S2, no murmurs, no rubs, no gallops  Respiratory: Clear to auscultation bilaterally, no wheezing, no crackles, no rhonchi  Abdomen: Soft, non tender, non distended, bowel sounds present, no guarding  Extremities: No edema   Data Reviewed:  Basic Metabolic Panel:  Recent Labs Lab 11/10/14 1055  11/11/14 0837  11/12/14 0400 11/12/14 0818 11/13/14 0730 11/14/14 0350 11/15/14 0607  NA 109*  < > 124*  < > 131* 130* 129* 136 133*  K >7.5*  < > 3.9  < > 3.2* 3.6 3.8 3.6 4.4  CL 72*  < > 88*  < > 97* 96* 95* 99* 97*  CO2 8*  < > 17*  < > 18* 20* 20* 26 24  GLUCOSE 1638*  < > 101*  < > 158* 220* 209* 91 75  BUN 55*  < > 57*  < > 32* 32* 42* 21* 30*  CREATININE 7.90*  < > 8.86*  < > 7.13* 7.15* 9.48* 6.65* 9.41*  CALCIUM 6.2*  < > 7.3*  < > 8.2* 8.2* 8.0* 8.6* 8.3*  MG 1.9  --   --   --   --   --   --   --   --   PHOS 10.6*  --  4.9*  --   --   --  4.5  --   --   < > =  values in this interval not displayed. Liver Function Tests:  Recent Labs Lab 11/11/14 0837 11/13/14 0730  ALBUMIN 3.0* 3.0*   No results for input(s): LIPASE, AMYLASE in the last 168 hours. No results for input(s): AMMONIA in the last 168 hours. CBC:  Recent Labs Lab 11/10/14 0908 11/10/14 1610 11/11/14 0836 11/12/14 0400 11/13/14 0730  WBC 10.1 9.7 11.6* 9.8 6.7  NEUTROABS 8.6*  --   --   --   --   HGB 10.6* 8.8* 9.0* 9.6* 9.5*  HCT 36.7 26.1* 25.0* 28.0* 27.7*  MCV 112.2* 95.6 89.6 93.6 92.0  PLT 192 156 153 163 145*   Cardiac Enzymes:  Recent Labs Lab 11/10/14 1610 11/10/14 2045 11/11/14 0245 11/11/14 0839 11/12/14 0028  TROPONINI 0.08* 0.19* 0.24* 0.18* 0.13*   BNP: Invalid input(s): POCBNP CBG:  Recent Labs Lab 11/14/14 1150 11/14/14 1746 11/14/14 2134 11/15/14 0738 11/15/14 1200  GLUCAP 79 325* 141* 66 69    Recent Results (from the past 240 hour(s))  MRSA PCR Screening     Status: None   Collection Time: 11/10/14  4:00 PM  Result Value Ref Range Status   MRSA by PCR NEGATIVE NEGATIVE Final    Comment:        The GeneXpert MRSA Assay (FDA approved for NASAL specimens only), is one component of a comprehensive MRSA colonization surveillance program. It is not intended to diagnose MRSA infection nor to guide or monitor treatment for MRSA infections.   Culture, blood (routine x 2)     Status: None (Preliminary result)   Collection Time: 11/10/14  4:35 PM  Result Value Ref Range Status   Specimen Description BLOOD RIGHT HAND  Final   Special Requests BOTTLES DRAWN AEROBIC ONLY 3CC  Final   Culture NO GROWTH 4 DAYS  Final   Report Status PENDING  Incomplete  Culture, blood (routine x 2)     Status: None (Preliminary result)   Collection Time: 11/10/14  7:43 PM  Result Value Ref Range Status   Specimen Description BLOOD RIGHT ARM  Final   Special Requests BOTTLES DRAWN AEROBIC ONLY 3CC  Final   Culture NO GROWTH 4 DAYS  Final   Report  Status PENDING  Incomplete     Scheduled Meds: . aspirin EC  325 mg Oral Daily  . calcium acetate  667  mg Oral q12n4p  . doxercalciferol  3 mcg Intravenous Q T,Th,Sa-HD  . gabapentin  100 mg Oral BID  . heparin  5,000 Units Subcutaneous 3 times per day  . insulin aspart  0-5 Units Subcutaneous QHS  . insulin aspart  0-9 Units Subcutaneous TID WC  . insulin glargine  10 Units Subcutaneous Q24H  . metoCLOPramide  5 mg Oral TID AC & HS  . metoprolol  10 mg Intravenous 4 times per day  . multivitamin  1 tablet Oral QHS  . pantoprazole  40 mg Oral BID AC  . sucralfate  1 g Oral TID WC & HS   Continuous Infusions:

## 2014-11-15 NOTE — Progress Notes (Signed)
BP 139/53, pulse 76. Paged on-call provider to verify administration of Lopressor 10mg  IV. Provider okayed medication. Will continue to monitor.

## 2014-11-15 NOTE — Progress Notes (Signed)
Inpatient Diabetes Program Recommendations  AACE/ADA: New Consensus Statement on Inpatient Glycemic Control (2013)  Target Ranges:  Prepandial:   less than 140 mg/dL      Peak postprandial:   less than 180 mg/dL (1-2 hours)      Critically ill patients:  140 - 180 mg/dL   Reason for Assessment: Hypoglycemia  Results for VENEDA, KIRKSEY (MRN [id number]) as of 11/15/2014 16:05  Ref. Range 11/14/2014 08:08 11/14/2014 11:50 11/14/2014 17:46 11/14/2014 21:34 11/15/2014 07:38 11/15/2014 12:00  Glucose-Capillary Latest Ref Range: 65-99 mg/dL 154 (H) 79 325 (H) 141 (H) 66 69    Note:  Patient was NPO for a procedure today.  Hypoglycemic at 07:38 and 12:00.  Recommendation: If not discharged after today's procedure, please consider changing Lantus order to 5 units bid starting tomorrow.     Thank you.  Keontay Vora S. Marcelline Mates, RN, CNS, CDE Inpatient Diabetes Program, team pager [phone] (8am to 5pm)

## 2014-11-15 NOTE — Care Management Note (Signed)
Case Management Note  Patient Details  Name: [name] MRN: [id number] Date of Birth: 03-29-1971  Subjective/Objective:         NCM will continue to follow for dc needs, for barium study today.           Action/Plan:   Expected Discharge Date:                  Expected Discharge Plan:  Home/Self Care  In-House Referral:     Discharge planning Services  CM Consult  Post Acute Care Choice:    Choice offered to:     DME Arranged:    DME Agency:     HH Arranged:    HH Agency:     Status of Service:  In process, will continue to follow  Medicare Important Message Given:  Yes Date Medicare IM Given:  11/12/14 Medicare IM give by:  Tomi Bamberger RN Date Additional Medicare IM Given:    Additional Medicare Important Message give by:     If discussed at [place] of Stay Meetings, dates discussed:    Additional Comments:  [name] Mayo, RN 11/15/2014, 12:48 PM

## 2014-11-15 NOTE — Plan of Care (Signed)
Pt BS was under 70, but asymptomatic. Pt given few swallows of OJ.

## 2014-11-15 NOTE — Discharge Planning (Signed)
Dr. Herbert Deaner slow moving esophagus with DG esophagus test. Educated patient and asked to eat slowly, chew food well and drink plenty of fluids. Advanced diet. Waiting to see how patient tolerates food and stated patient to be DC plan is to DC after dialysis 6/21.

## 2014-11-15 NOTE — Progress Notes (Signed)
Subjective:  awaiting barium swallow this am  Next HD in am tue/- says nausea this bad is new for her  Objective Vital signs in last 24 hours: Filed Vitals:   11/15/14 0010 11/15/14 0409 11/15/14 0540 11/15/14 0740  BP: 133/64 133/54 139/53 144/62  Pulse: 78 76 76 74  Temp: 99 F (37.2 C) 98.5 F (36.9 C)  98.3 F (36.8 C)  TempSrc: Oral Oral  Oral  Resp: 18 18  18   Height:      Weight:      SpO2: 100% 95%  95%   Weight change:   Physical Exam: General: Alert, OX3  Heart: RRR, No M,G,R Lungs:CTA  Abdomen: soft, nontender/ Nondistended  BS pos. Extremities: No peripheral edema Dialysis Access: R. Thigh AVG pos bruit    OP Dialysis Orders: TTS NW 3hr 45 min 69kgs 2K/2.5Ca+ No heparin R thigh AVG hectorol 3 Micera 100 q 2 weeks last dose given 6/9  Problem/Plan: 1. DKA- BS's stable. N/V eval with Ba. Swallow this am  Admit team WU. Feeling somewhat  better still on Liquids .  2.  N/V-  On Sucralfate avoid longer 4-6 weeks with ESRD PT. Sec to Alum Toxicity/  WU per Admit team - says it has never been this bad before  3. ESRD - normal op  TTS  NW HD via AVG- plan for HD in AM 4. Hypertension/volume - 144/62., Under her dry wt 1-2kg- likely lost weight with everything 5. Anemia - HGB 9.5 On ESA at HD center.  6. Metabolic bone disease - Ca+ 8.3 corec =9.1  Phos 4.5  Phos lo binder/ vit d on hd 7. Nutrition - renal diet / carb mod when advanced. multivit 8. Prolonged bleeding from AVG- consult VVS- had appt pending 6/16 - last access flow 1743 and last intervention 09/15/14 at [place] with Dr Maeola Harman, PA-C [place] [phone] 11/15/2014,9:18 AM  LOS: 5 days    Patient seen and examined, agree with above note with above modifications. No new complaints but still quite nauseated- for barium swallow today.  Plan for HD tomorrow- usual schedule- is not due for mircera for anemia til 6/23- may need dose of aranesp at that time if  still here.  Corliss Parish, MD 11/15/2014     Labs: Basic Metabolic Panel:  Recent Labs Lab 11/10/14 1055  11/11/14 0837  11/13/14 0730 11/14/14 0350 11/15/14 0607  NA 109*  < > 124*  < > 129* 136 133*  K >7.5*  < > 3.9  < > 3.8 3.6 4.4  CL 72*  < > 88*  < > 95* 99* 97*  CO2 8*  < > 17*  < > 20* 26 24  GLUCOSE 1638*  < > 101*  < > 209* 91 75  BUN 55*  < > 57*  < > 42* 21* 30*  CREATININE 7.90*  < > 8.86*  < > 9.48* 6.65* 9.41*  CALCIUM 6.2*  < > 7.3*  < > 8.0* 8.6* 8.3*  PHOS 10.6*  --  4.9*  --  4.5  --   --   < > = values in this interval not displayed. Liver Function Tests:  Recent Labs Lab 11/11/14 0837 11/13/14 0730  ALBUMIN 3.0* 3.0*    Recent Labs Lab 11/10/14 0908 11/10/14 1610 11/11/14 0836 11/12/14 0400 11/13/14 0730  WBC 10.1 9.7 11.6* 9.8 6.7  NEUTROABS 8.6*  --   --   --   --  HGB 10.6* 8.8* 9.0* 9.6* 9.5*  HCT 36.7 26.1* 25.0* 28.0* 27.7*  MCV 112.2* 95.6 89.6 93.6 92.0  PLT 192 156 153 163 145*   Cardiac Enzymes:  Recent Labs Lab 11/10/14 1610 11/10/14 2045 11/11/14 0245 11/11/14 0839 11/12/14 0028  TROPONINI 0.08* 0.19* 0.24* 0.18* 0.13*   CBG:  Recent Labs Lab 11/14/14 0808 11/14/14 1150 11/14/14 1746 11/14/14 2134 11/15/14 0738  GLUCAP 154* 79 325* 141* 66    Studies/Results: No results found. Medications:   . aspirin EC  325 mg Oral Daily  . calcium acetate  667 mg Oral q12n4p  . doxercalciferol  3 mcg Intravenous Q T,Th,Sa-HD  . gabapentin  100 mg Oral BID  . heparin  5,000 Units Subcutaneous 3 times per day  . insulin aspart  0-5 Units Subcutaneous QHS  . insulin aspart  0-9 Units Subcutaneous TID WC  . insulin glargine  10 Units Subcutaneous Q24H  . metoCLOPramide (REGLAN) injection  5 mg Intravenous TID AC & HS  . metoprolol  10 mg Intravenous 4 times per day  . multivitamin  1 tablet Oral QHS  . pantoprazole (PROTONIX) IV  40 mg Intravenous BID AC  . sucralfate  1 g Oral TID WC & HS

## 2014-11-16 LAB — GLUCOSE, CAPILLARY: Glucose-Capillary: 130 mg/dL — ABNORMAL HIGH (ref 65–99)

## 2014-11-16 LAB — RENAL FUNCTION PANEL
ALBUMIN: 3 g/dL — AB (ref 3.5–5.0)
ANION GAP: 9 (ref 5–15)
BUN: 39 mg/dL — ABNORMAL HIGH (ref 6–20)
CHLORIDE: 98 mmol/L — AB (ref 101–111)
CO2: 25 mmol/L (ref 22–32)
Calcium: 8.3 mg/dL — ABNORMAL LOW (ref 8.9–10.3)
Creatinine, Ser: 11.47 mg/dL — ABNORMAL HIGH (ref 0.44–1.00)
GFR, EST AFRICAN AMERICAN: 4 mL/min — AB (ref >60)
GFR, EST NON AFRICAN AMERICAN: 4 mL/min — AB (ref >60)
Glucose, Bld: 221 mg/dL — ABNORMAL HIGH (ref 65–99)
PHOSPHORUS: 4.9 mg/dL — AB (ref 2.5–4.6)
POTASSIUM: 4.4 mmol/L (ref 3.5–5.1)
SODIUM: 132 mmol/L — AB (ref 135–145)

## 2014-11-16 LAB — CBC
HCT: 28.3 % — ABNORMAL LOW (ref 36.0–46.0)
Hemoglobin: 9.4 g/dL — ABNORMAL LOW (ref 12.0–15.0)
MCH: 32.2 pg (ref 26.0–34.0)
MCHC: 33.2 g/dL (ref 30.0–36.0)
MCV: 96.9 fL (ref 78.0–100.0)
PLATELETS: 169 10*3/uL (ref 150–400)
RBC: 2.92 MIL/uL — AB (ref 3.87–5.11)
RDW: 14.5 % (ref 11.5–15.5)
WBC: 5.1 10*3/uL (ref 4.0–10.5)

## 2014-11-16 MED ORDER — PROMETHAZINE HCL 25 MG/ML IJ SOLN
12.5000 mg | Freq: Once | INTRAMUSCULAR | Status: AC
  Administered 2014-11-16: 12.5 mg via INTRAVENOUS
  Filled 2014-11-16: qty 1

## 2014-11-16 MED ORDER — DOXERCALCIFEROL 4 MCG/2ML IV SOLN
INTRAVENOUS | Status: AC
  Filled 2014-11-16: qty 2

## 2014-11-16 MED ORDER — PROMETHAZINE HCL 25 MG/ML IJ SOLN
INTRAMUSCULAR | Status: AC
  Filled 2014-11-16: qty 1

## 2014-11-16 MED ORDER — CALCIUM ACETATE 667 MG PO CAPS: 667.0000 mg | ORAL_CAPSULE | Freq: Two times a day (BID) | ORAL | Status: AC

## 2014-11-16 NOTE — Procedures (Signed)
Patient was seen on dialysis and the procedure was supervised.  BFR 400  Via AVG BP is  122/64.   Patient appears to be tolerating treatment well  Tiffanny Lamarche A 11/16/2014

## 2014-11-16 NOTE — Progress Notes (Signed)
Subjective:  Barium swallow confirms slow moving esophagus- seen on HD c/o nausea but talking on the phone  Objective Vital signs in last 24 hours: Filed Vitals:   11/16/14 0705 11/16/14 0730 11/16/14 0800 11/16/14 0830  BP: 151/73 145/77 126/68 122/64  Pulse: 75 75 77 75  Temp:      TempSrc:      Resp:      Height:      Weight:      SpO2:       Weight change:   Physical Exam: General: Alert, OX3  Heart: RRR, No M,G,R Lungs:CTA  Abdomen: soft, nontender/ Nondistended  BS pos. Extremities: No peripheral edema Dialysis Access: R. Thigh AVG pos bruit    OP Dialysis Orders: TTS NW 3hr 45 min 69kgs 2K/2.5Ca+ No heparin R thigh AVG hectorol 3 Micera 100 q 2 weeks last dose given 6/9  Problem/Plan: 1. DKA- BS's stable. N/V eval with Ba. Swallow confirmed slow moving  Admit team WU. Said dinner came back up last night 2.  N/V-  On Sucralfate avoid longer 4-6 weeks with ESRD PT. Sec to Alum Toxicity/  WU per Admit team - says it has never been this bad before - says is ready to go home encouraged small meals and antiemetics 3. ESRD - normal op  TTS  NW HD via AVG- HD today 4. Hypertension/volume - 144/62., Under her dry wt 1-2kg- likely lost weight with everything 5. Anemia - HGB 9.4 On ESA at HD center.  6. Metabolic bone disease - Ca+ 8.3 corec =9.1  Phos 4.9  Phos lo binder/ vit d on hd 7. Nutrition - renal diet / carb mod when advanced. multivit Prolonged bleeding from AVG-last access flow 1743 and last intervention 09/15/14 at [place] with Dr Augustin Coupe - said is still an issue- did not get managed here   [place] A   11/16/2014     Labs: Basic Metabolic Panel:  Recent Labs Lab 11/11/14 0837  11/13/14 0730 11/14/14 0350 11/15/14 0607 11/16/14 0726  NA 124*  < > 129* 136 133* 132*  K 3.9  < > 3.8 3.6 4.4 4.4  CL 88*  < > 95* 99* 97* 98*  CO2 17*  < > 20* 26 24 25   GLUCOSE 101*  < > 209* 91 75 221*  BUN 57*  < > 42* 21* 30* 39*  CREATININE 8.86*   < > 9.48* 6.65* 9.41* 11.47*  CALCIUM 7.3*  < > 8.0* 8.6* 8.3* 8.3*  PHOS 4.9*  --  4.5  --   --  4.9*  < > = values in this interval not displayed. Liver Function Tests:  Recent Labs Lab 11/11/14 0837 11/13/14 0730 11/16/14 0726  ALBUMIN 3.0* 3.0* 3.0*    Recent Labs Lab 11/10/14 0908 11/10/14 1610 11/11/14 0836 11/12/14 0400 11/13/14 0730 11/16/14 0726  WBC 10.1 9.7 11.6* 9.8 6.7 5.1  NEUTROABS 8.6*  --   --   --   --   --   HGB 10.6* 8.8* 9.0* 9.6* 9.5* 9.4*  HCT 36.7 26.1* 25.0* 28.0* 27.7* 28.3*  MCV 112.2* 95.6 89.6 93.6 92.0 96.9  PLT 192 156 153 163 145* 169   Cardiac Enzymes:  Recent Labs Lab 11/10/14 1610 11/10/14 2045 11/11/14 0245 11/11/14 0839 11/12/14 0028  TROPONINI 0.08* 0.19* 0.24* 0.18* 0.13*   CBG:  Recent Labs Lab 11/14/14 2134 11/15/14 0738 11/15/14 1200 11/15/14 1734 11/15/14 2152  GLUCAP 141* 66 69 147* 393*    Studies/Results: Dg Esophagus  11/15/2014   CLINICAL DATA:  Chest pain when swallowing.  Evaluate esophagus.  EXAM: ESOPHOGRAM/BARIUM SWALLOW  TECHNIQUE: Single contrast examination was performed using  thin barium.  FLUOROSCOPY TIME:  If the device does not provide the exposure index:  Fluoroscopy Time:  2 minutes and 24 seconds  Number of Acquired Images:  None  COMPARISON:  Chest radiograph of 11/10/2014.  FINDINGS: Focused exam was performed using single contrast technique and RAO position.  Evaluation of primary peristalsis demonstrates mild esophageal dysmotility, with contrast stasis in the lower thoracic esophagus.  Full column evaluation of the esophagus demonstrates no persistent narrowing or stricture.  IMPRESSION: 1. No evidence of esophageal stricture. 2. Mild esophageal dysmotility, with contrast stasis in the lower thoracic esophagus.   Electronically Signed   By: Abigail Miyamoto M.D.   On: 11/15/2014 16:22   Medications:   . aspirin EC  325 mg Oral Daily  . calcium acetate  667 mg Oral q12n4p  . doxercalciferol  3  mcg Intravenous Q T,Th,Sa-HD  . gabapentin  100 mg Oral BID  . heparin  5,000 Units Subcutaneous 3 times per day  . insulin aspart  0-5 Units Subcutaneous QHS  . insulin aspart  0-9 Units Subcutaneous TID WC  . insulin glargine  10 Units Subcutaneous Q24H  . metoCLOPramide  5 mg Oral TID AC & HS  . metoprolol  10 mg Intravenous 4 times per day  . multivitamin  1 tablet Oral QHS  . pantoprazole  40 mg Oral BID AC  . sucralfate  1 g Oral TID WC & HS

## 2014-11-16 NOTE — Discharge Summary (Signed)
Physician Discharge Summary  KATILYNN SINKLER EYC:[id number] DOB: August 11, 1970 DOA: 11/10/2014  PCP: [name], MD  Admit date: 11/10/2014 Discharge date: 11/16/2014  Recommendations for Outpatient Follow-up:  1. Pt will need to follow up with PCP in 2-3 weeks post discharge  Discharge Diagnoses:  Principal Problem:   DKA, type 1 Active Problems:   History of simultaneous kidney and pancreas transplant   Gastroparesis   End-stage renal disease on hemodialysis   HTN (hypertension)  Discharge Condition: Stable  Diet recommendation: renal diet   Brief narrative:    44 year old female with ESRD, type I DM, HTN, history of hospitalizations for DKA, presented to [facility] ED with main concern of several days duration of progressively worsening nausea and non bloody vomiting. Poor oral intake and elevated sugars > 300. In ED, pt was found to be in DKA and TSH asked to admit for further evaluation. Initially admitted to ICU and transferred to telemetry bed once off insulin drip. Hospital course complicated by persistent N/V despite resolution of DKA.   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  Family Communication: plan of care discussed with the patient Disposition Plan: Home  IV access:  Right thigh AVG  Procedures and diagnostic studies:   Dg Chest Port 1 View 11/10/2014 Bibasilar opacity is likely atelectasis given hypoventilation. No edema or definitive pneumonia.   Medical Consultants:  Nephrology   Other Consultants:  None  IAnti-Infectives:   None      Discharge Exam: Filed Vitals:   11/16/14 1000  BP: 122/45  Pulse: 78  Temp:   Resp:    Filed Vitals:   11/16/14 0830 11/16/14 0900 11/16/14 0930 11/16/14 1000  BP: 122/64 118/61 98/49 122/45  Pulse: 75 76 76 78  Temp:      TempSrc:      Resp:      Height:      Weight:      SpO2:        General: Pt is alert, follows commands appropriately, not in acute distress Cardiovascular: Regular rate and rhythm, S1/S2 +, no murmurs, no rubs, no gallops Respiratory: Clear to auscultation bilaterally, no wheezing, no crackles, no rhonchi Abdominal: Soft, non tender, non distended, bowel sounds +, no guarding Extremities: no edema, no cyanosis, pulses palpable bilaterally DP and PT Neuro: Grossly nonfocal  Discharge Instructions  Discharge Instructions    Diet - low sodium heart healthy    Complete by:  As directed      Increase activity slowly    Complete by:  As directed             Medication  List    TAKE these medications        albuterol 108 (90 BASE) MCG/ACT inhaler  Commonly known as:  PROVENTIL HFA;VENTOLIN HFA  Inhale 2 puffs into the lungs every 4 (four) hours as needed for wheezing or shortness of breath (as per home regimen).     aspirin 325 MG tablet  Take 1 tablet (325 mg total) by mouth daily.     calcium acetate 667 MG capsule  Commonly known as:  PHOSLO  Take 1 capsule (667 mg total) by mouth 2 (two) times daily.     clonazePAM 0.5 MG tablet  Commonly known as:  KLONOPIN  Take 0.5 mg by mouth 2  (two) times daily.     Darbepoetin Alfa 40 MCG/0.4ML Sosy injection  Commonly known as:  ARANESP  Inject 0.4 mLs (40 mcg total) into the vein every Thursday with hemodialysis.     doxercalciferol 4 MCG/2ML injection  Commonly known as:  HECTOROL  Inject 1 mL (2 mcg total) into the vein Every Tuesday,Thursday,and Saturday with dialysis.     feeding supplement (NEPRO CARB STEADY) Liqd  Take 237 mLs by mouth 2 (two) times daily between meals.     gabapentin 100 MG capsule  Commonly known as:  NEURONTIN  Take 100 mg by mouth 2 (two) times daily.     insulin aspart 100 UNIT/ML injection  Commonly known as:  novoLOG  Inject 7 Units into the skin 3 (three) times daily with meals.     insulin glargine 100 UNIT/ML injection  Commonly known as:  LANTUS  Inject 0.07 mLs (7 Units total) into the skin 2 (two) times daily.     lidocaine 5 %  Commonly known as:  LIDODERM  Place 1 patch onto the skin daily. Remove & Discard patch within 12 hours or as directed by MD     metoCLOPramide 5 MG tablet  Commonly known as:  REGLAN  Take 1 tablet (5 mg total) by mouth 4 (four) times daily -  before meals and at bedtime.     metoprolol succinate 100 MG 24 hr tablet  Commonly known as:  TOPROL-XL  Take 1 tablet (100 mg total) by mouth daily. Take with or immediately following a meal.     montelukast 10 MG tablet  Commonly known as:  SINGULAIR  Take 10 mg by mouth at bedtime.     multivitamin Tabs tablet  Take 1 tablet by mouth daily.     pantoprazole 40 MG tablet  Commonly known as:  PROTONIX  Take 1 tablet (40 mg total) by mouth at bedtime.     promethazine 25 MG tablet  Commonly known as:  PHENERGAN  Take 25 mg by mouth every 6 (six) hours as needed for nausea or vomiting.     simvastatin 20 MG tablet  Commonly known as:  ZOCOR  Take 1 tablet (20 mg total) by mouth at bedtime.     zolpidem 5 MG tablet  Commonly known as:  AMBIEN  Take 5 mg by mouth at bedtime as needed. sleep             Follow-up Information    Follow up with [facility], Gifford Shave, MD.   Specialty:  Family Medicine   Contact information:   [address] [place] [address] [phone]       Call Faye Ramsay, MD.   Specialty:  Internal Medicine   Why:  As needed   Contact information:  [address] [place] [address] [phone]        The results of significant diagnostics from this hospitalization (including imaging, microbiology, ancillary and laboratory) are listed below for reference.     Microbiology: Recent Results (from the past 240 hour(s))  MRSA PCR Screening     Status: None   Collection Time: 11/10/14  4:00 PM  Result Value Ref Range Status   MRSA by PCR NEGATIVE NEGATIVE Final    Comment:        The GeneXpert MRSA Assay (FDA approved for NASAL specimens only), is one component of a comprehensive MRSA colonization surveillance program. It is not intended to diagnose MRSA infection nor to guide or monitor treatment for MRSA infections.   Culture, blood (routine x 2)     Status: None   Collection Time: 11/10/14  4:35 PM  Result Value Ref Range Status   Specimen Description BLOOD RIGHT HAND  Final   Special Requests BOTTLES DRAWN AEROBIC ONLY 3CC  Final   Culture NO GROWTH 5 DAYS  Final   Report Status 11/15/2014 FINAL  Final  Culture, blood (routine x 2)     Status: None   Collection Time: 11/10/14  7:43 PM  Result Value Ref Range Status   Specimen Description BLOOD RIGHT ARM  Final   Special Requests BOTTLES DRAWN AEROBIC ONLY 3CC  Final   Culture NO GROWTH 5 DAYS  Final   Report Status 11/15/2014 FINAL  Final     Labs: Basic Metabolic Panel:  Recent Labs Lab 11/10/14 1055  11/11/14 0837  11/12/14 0818 11/13/14 0730 11/14/14 0350 11/15/14 0607 11/16/14 0726  NA 109*  < > 124*  < > 130* 129* 136 133* 132*  K >7.5*  < > 3.9  < > 3.6 3.8 3.6 4.4 4.4  CL 72*  < > 88*  < > 96* 95* 99* 97* 98*   CO2 8*  < > 17*  < > 20* 20* 26 24 25   GLUCOSE 1638*  < > 101*  < > 220* 209* 91 75 221*  BUN 55*  < > 57*  < > 32* 42* 21* 30* 39*  CREATININE 7.90*  < > 8.86*  < > 7.15* 9.48* 6.65* 9.41* 11.47*  CALCIUM 6.2*  < > 7.3*  < > 8.2* 8.0* 8.6* 8.3* 8.3*  MG 1.9  --   --   --   --   --   --   --   --   PHOS 10.6*  --  4.9*  --   --  4.5  --   --  4.9*  < > = values in this interval not displayed. Liver Function Tests:  Recent Labs Lab 11/11/14 0837 11/13/14 0730 11/16/14 0726  ALBUMIN 3.0* 3.0* 3.0*   No results for input(s): LIPASE, AMYLASE in the last 168 hours. No results for input(s): AMMONIA in the last 168 hours. CBC:  Recent Labs Lab 11/10/14 0908 11/10/14 1610 11/11/14 0836 11/12/14 0400 11/13/14 0730 11/16/14 0726  WBC 10.1 9.7 11.6* 9.8 6.7 5.1  NEUTROABS 8.6*  --   --   --   --   --   HGB 10.6* 8.8* 9.0* 9.6* 9.5* 9.4*  HCT 36.7 26.1* 25.0* 28.0* 27.7* 28.3*  MCV 112.2* 95.6 89.6 93.6 92.0 96.9  PLT 192 156 153 163 145* 169   Cardiac Enzymes:  Recent Labs Lab 11/10/14 1610 11/10/14 2045 11/11/14 0245 11/11/14 0839 11/12/14 0028  TROPONINI 0.08* 0.19*  0.24* 0.18* 0.13*   BNP: BNP (last 3 results) No results for input(s): BNP in the last 8760 hours.  ProBNP (last 3 results)  Recent Labs  01/14/14 0216  PROBNP 17541.0*    CBG:  Recent Labs Lab 11/14/14 2134 11/15/14 0738 11/15/14 1200 11/15/14 1734 11/15/14 2152  GLUCAP 141* 66 69 147* 393*     SIGNED: Time coordinating discharge: 30 minutes  MAGICK-MYERS, ISKRA, MD  [facility] 11/16/2014, 10:26 AM Pager [phone]  If 7PM-7AM, please contact night-coverage [email or web address] Password [email or web address]

## 2014-11-16 NOTE — Care Management Note (Signed)
Case Management Note  Patient Details  Name: [name] MRN: [id number] Date of Birth: [birth year]  Subjective/Objective:     Patient is for dc today, she will have [facility] and Social worker , she is [place] with [place] is aware of patient being dc today. Patient is agreeable to [facility] services.                Action/Plan:   Expected Discharge Date:                  Expected Discharge Plan:  Home/Self Care  In-House Referral:     Discharge planning Services  CM Consult  Post Acute Care Choice:  Home Health Choice offered to:  Patient  DME Arranged:    DME Agency:     HH Arranged:  RN (Education officer, museum) [place]:  [place]  Status of Service:  Completed, signed off  Medicare Important Message Given:  Yes Date Medicare IM Given:    Medicare IM give by:  Tomi Bamberger RN Date Additional Medicare IM Given:  11/15/14 Additional Medicare Important Message give by:  Tomi Bamberger RN  If discussed at Long Length of Stay Meetings, dates discussed:    Additional Comments:  Zenon Mayo, RN 11/16/2014, 2:23 PM

## 2014-11-16 NOTE — Discharge Instructions (Signed)
Diabetes and Exercise Exercising regularly is important. It is not just about losing weight. It has many health benefits, such as:  Improving your overall fitness, flexibility, and endurance.  Increasing your bone density.  Helping with weight control.  Decreasing your body fat.  Increasing your muscle strength.  Reducing stress and tension.  Improving your overall health. People with diabetes who exercise gain additional benefits because exercise:  Reduces appetite.  Improves the body's use of blood sugar (glucose).  Helps lower or control blood glucose.  Decreases blood pressure.  Helps control blood lipids (such as cholesterol and triglycerides).  Improves the body's use of the hormone insulin by:  Increasing the body's insulin sensitivity.  Reducing the body's insulin needs.  Decreases the risk for heart disease because exercising:  Lowers cholesterol and triglycerides levels.  Increases the levels of good cholesterol (such as high-density lipoproteins [HDL]) in the body.  Lowers blood glucose levels. YOUR ACTIVITY PLAN  Choose an activity that you enjoy and set realistic goals. Your health care provider or diabetes educator can help you make an activity plan that works for you. Exercise regularly as directed by your health care provider. This includes:  Performing resistance training twice a week such as push-ups, sit-ups, lifting weights, or using resistance bands.  Performing 150 minutes of cardio exercises each week such as walking, running, or playing sports.  Staying active and spending no more than 90 minutes at one time being inactive. Even short bursts of exercise are good for you. Three 10-minute sessions spread throughout the day are just as beneficial as a single 30-minute session. Some exercise ideas include:  Taking the dog for a walk.  Taking the stairs instead of the elevator.  Dancing to your favorite song.  Doing an exercise  video.  Doing your favorite exercise with a friend. RECOMMENDATIONS FOR EXERCISING WITH TYPE 1 OR TYPE 2 DIABETES   Check your blood glucose before exercising. If blood glucose levels are greater than 240 mg/dL, check for urine ketones. Do not exercise if ketones are present.  Avoid injecting insulin into areas of the body that are going to be exercised. For example, avoid injecting insulin into:  The arms when playing tennis.  The legs when jogging.  Keep a record of:  Food intake before and after you exercise.  Expected peak times of insulin action.  Blood glucose levels before and after you exercise.  The type and amount of exercise you have done.  Review your records with your health care provider. Your health care provider will help you to develop guidelines for adjusting food intake and insulin amounts before and after exercising.  If you take insulin or oral hypoglycemic agents, watch for signs and symptoms of hypoglycemia. They include:  Dizziness.  Shaking.  Sweating.  Chills.  Confusion.  Drink plenty of water while you exercise to prevent dehydration or heat stroke. Body water is lost during exercise and must be replaced.  Talk to your health care provider before starting an exercise program to make sure it is safe for you. Remember, almost any type of activity is better than none. Document Released: 08/04/2003 Document Revised: 09/28/2013 Document Reviewed: 10/21/2012 [facility] Patient Information 2015 [place], [place]. This information is not intended to replace advice given to you by your health care provider. Make sure you discuss any questions you have with your health care provider.   Diabetic Ketoacidosis Diabetic ketoacidosis (DKA) is a life-threatening complication of type 1 diabetes. It must be quickly recognized and  treated. Treatment requires hospitalization. CAUSES  When there is no insulin in the body, glucose (sugar) cannot be used, and the body  breaks down fat for energy. When fat breaks down, acids (ketones) build up in the blood. Very high levels of glucose and high levels of acids lead to severe loss of body fluids (dehydration) and other dangerous chemical changes. This stresses your vital organs and can cause coma or death. SIGNS AND SYMPTOMS   Tiredness (fatigue).  Weight loss.  Excessive thirst.  Ketones in your urine.  Light-headedness.  Fruity or sweet smelling breath.  Excessive urination.  Visual changes.  Confusion or irritability.  Nausea or vomiting.  Rapid breathing.  Stomachache or abdominal pain. DIAGNOSIS  Your health care provider will diagnose DKA based on your history, physical exam, and blood tests. The health care provider will check to see if you have another illness that caused you to go into DKA. Most of this will be done quickly in an emergency room. TREATMENT   Fluid replacement to correct dehydration.  Insulin.  Correction of electrolytes, such as potassium and sodium.  Antibiotic medicines. PREVENTION  Always take your insulin. Do not skip your insulin injections.  If you are sick, treat yourself quickly. Your body often needs more insulin to fight the illness.  Check your blood glucose regularly.  Check urine ketones if your blood glucose is greater than 240 milligrams per deciliter (mg/dL).  Do not use outdated (expired) insulin.  If your blood glucose is high, drink plenty of fluids. This helps flush out ketones. HOME CARE INSTRUCTIONS   If you are sick, follow the advice of your health care provider.  To prevent dehydration, drink enough water and fluids to keep your urine clear or pale yellow.  If you cannot eat, alternate between drinking fluids with sugar (soda, juices, flavored gelatin) and salty fluids (broth, bouillon).  If you can eat, follow your usual diet and drink sugar-free liquids (water, diet drinks).  Always take your usual dose of insulin. If you  cannot eat or if your glucose is getting too low, call your health care provider for further instructions.  Continue to monitor your blood or urine ketones every 3-4 hours around the clock. Set your alarm clock or have someone wake you up. If you are too sick, have someone test it for you.  Rest and avoid exercise. SEEK MEDICAL CARE IF:   You have a fever.  You have ketones in your urine, or your blood glucose is higher than a level your health care provider suggests. You may need extra insulin. Call your health care provider if you need advice on adjusting your insulin.  You cannot drink at least a tablespoon (15 mL) of fluid every 15-20 minutes.  You have been vomiting for more than 2 hours.  You have symptoms of DKA:  Fruity smelling breath.  Breathing faster or slower.  Becoming very sleepy. SEEK IMMEDIATE MEDICAL CARE IF:   You have signs of dehydration:  Decreased urination.  Increased thirst.  Dry skin and mouth.  Light-headedness.  Your blood glucose is very high (as advised by your health care provider) twice in a row.  You faint.  You have chest pain or trouble breathing.  You have a sudden, severe headache.  You have sudden weakness in one arm or one leg.  You have sudden trouble speaking or swallowing.  You have vomiting or diarrhea that is getting worse after 3 hours.  You have abdominal pain. MAKE SURE  YOU:   Understand these instructions.  Will watch your condition.  Will get help right away if you are not doing well or get worse. Document Released: 05/11/2000 Document Revised: 05/19/2013 Document Reviewed: 11/17/2008 [facility] Patient Information 2015 [place], [place]. This information is not intended to replace advice given to you by your health care provider. Make sure you discuss any questions you have with your health care provider.

## 2014-11-26 IMAGING — US US PELVIS COMPLETE
1 series · 14 of 25 positions shown · non-contrast
Comparison: 06/10/2012 CT of the abdomen pelvis.

CLINICAL DATA: Ovarian cyst. LMP 10/21/2012



[Series 1: us pelvis complete · 14 of 37 slices shown]
[im 1/37]
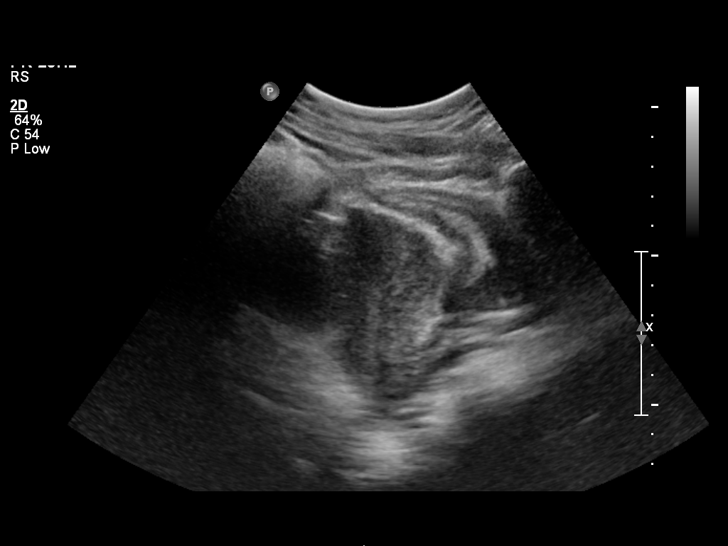
[im 4/37]
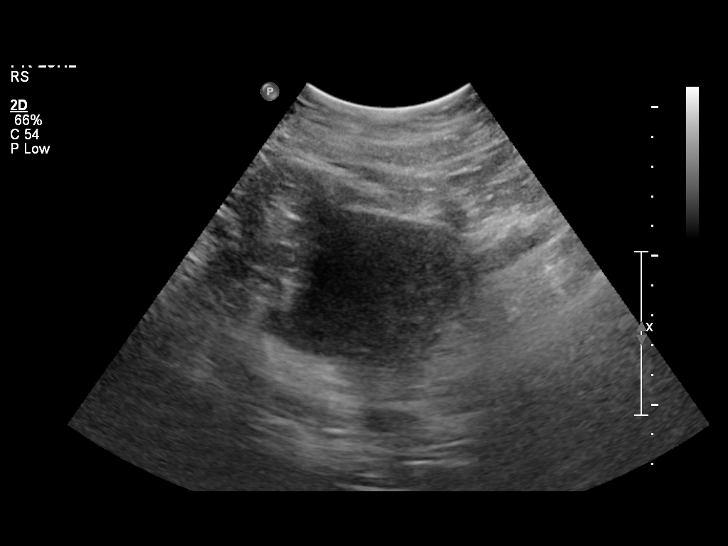
[im 7/37]
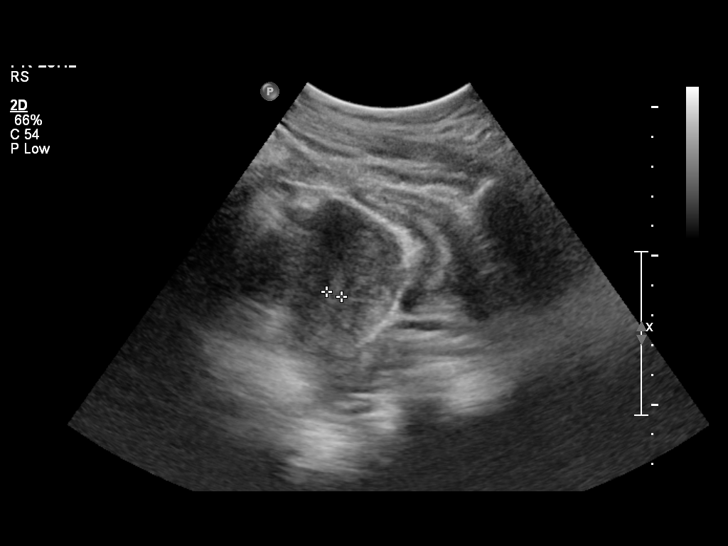
[im 10/37]
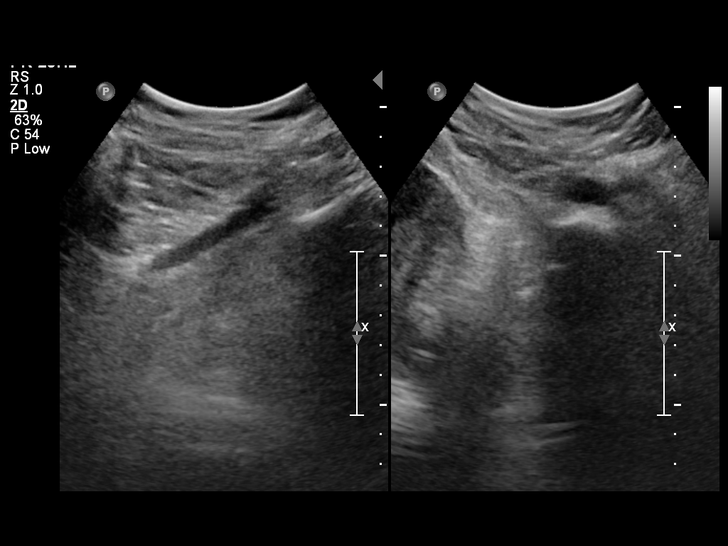
[im 13/37]
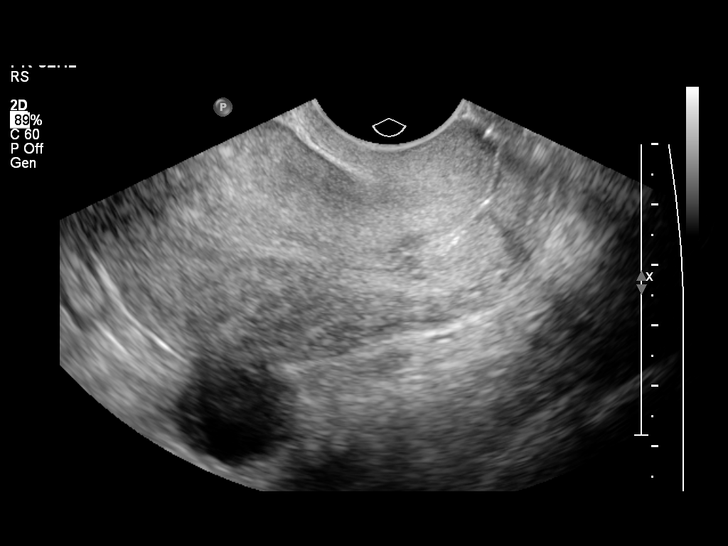
[im 14/37]
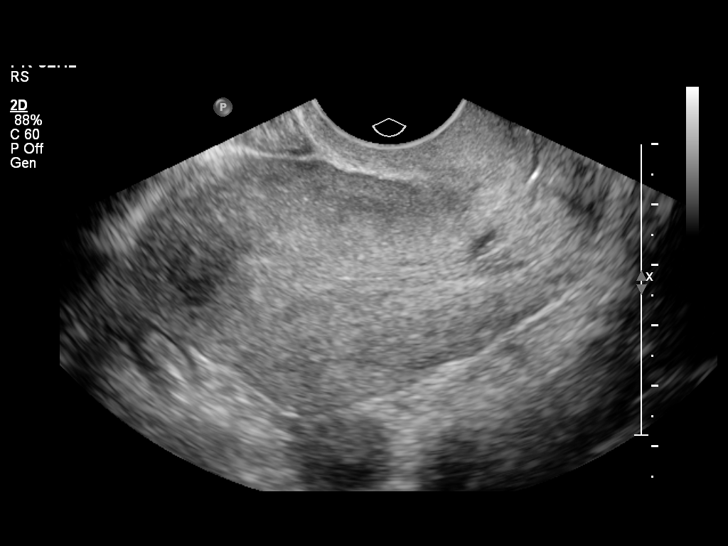
[im 17/37]
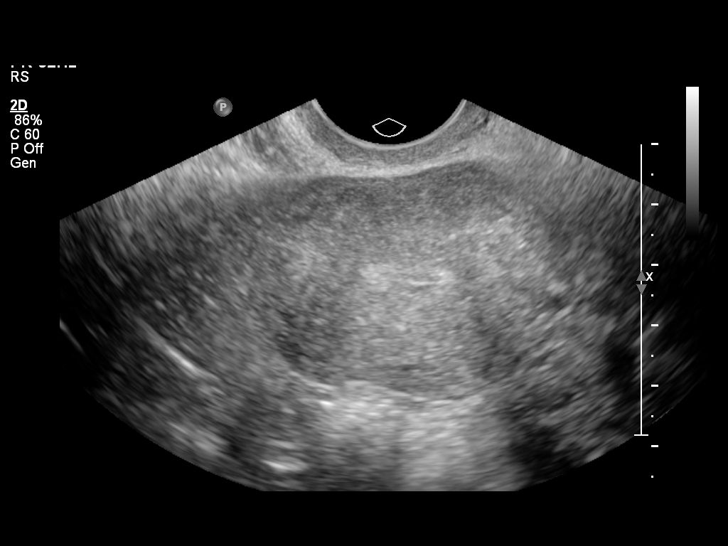
[im 20/37]
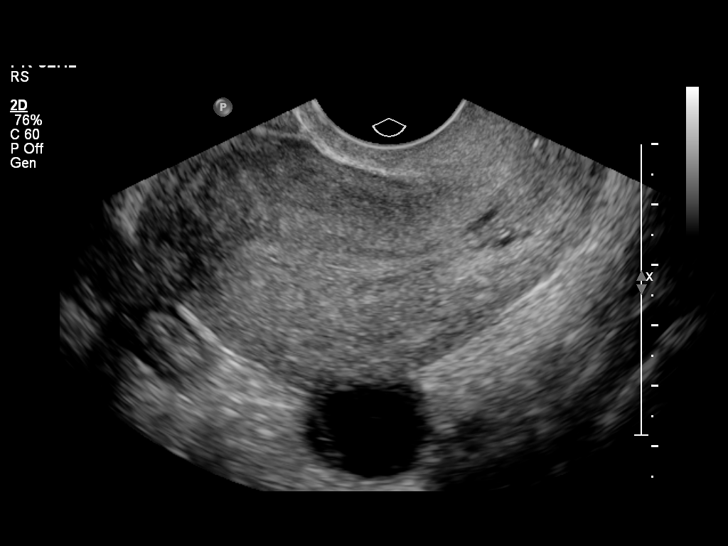
[im 23/37]
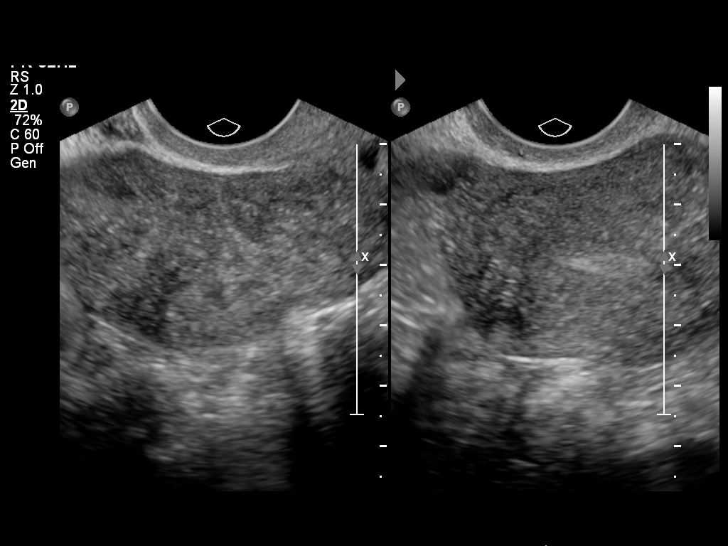
[im 25/37]
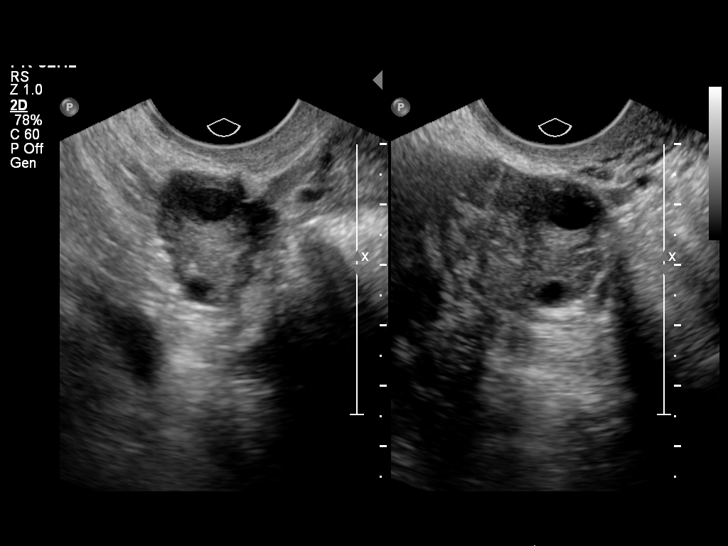
[im 28/37]
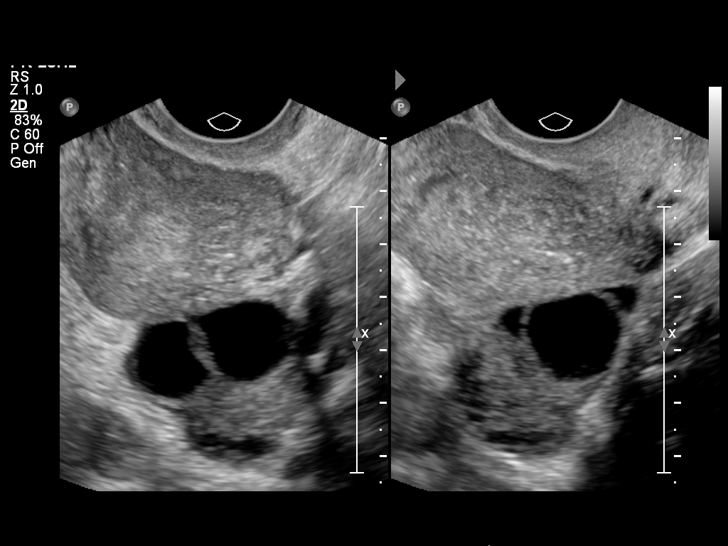
[im 31/37]
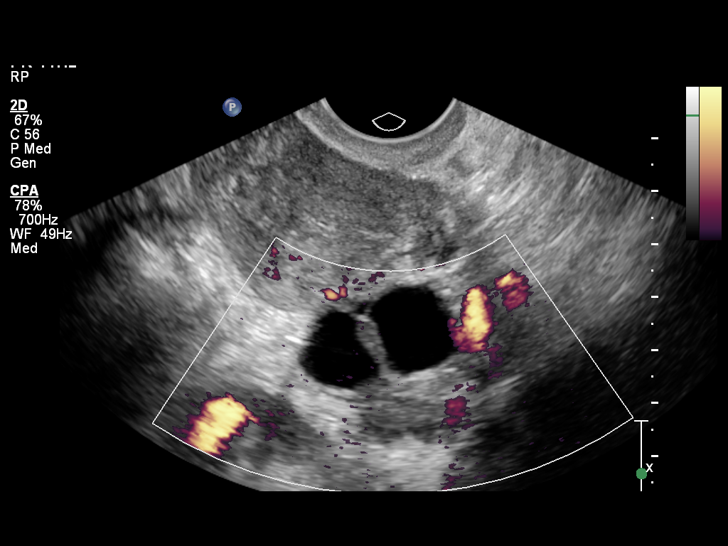
[im 34/37]
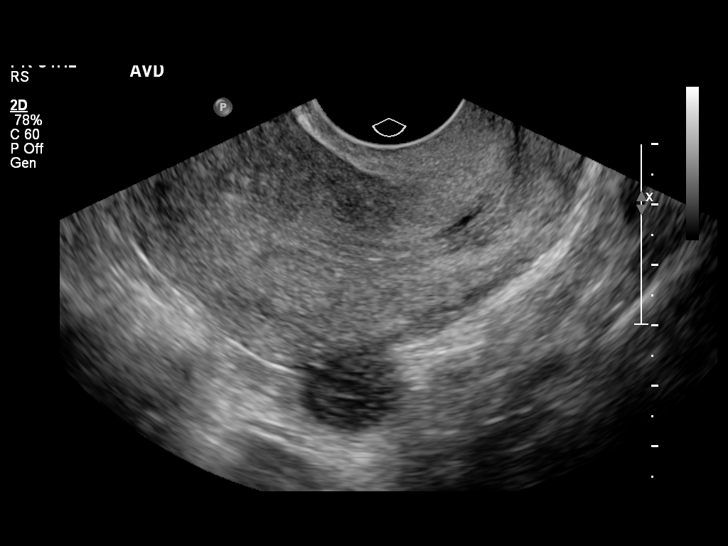
[im 37/37]
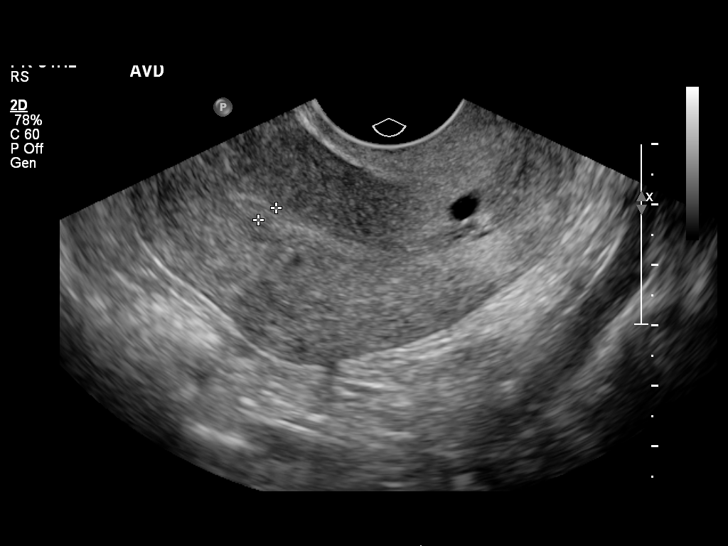

[14 of 25 positions shown; findings below may reference images not displayed]

FINDINGS: Uterus: 8.0 x 4.2 x 4.9 cm. Small posterior fibroid is 1.6 x 0.8 x
1.1 cm.

Endometrium: Normal in appearance, 5.6 mm.

Right ovary:  Normal appearance, 2.3 x 1.2 x 2.3 cm.

Left ovary: Normal in appearance, 3.5 x 4.3 x 2.4 cm.

Other findings: No free fluid
IMPRESSION: 1.  Small posterior uterine fibroid.
2.  Normal-appearing ovaries..

## 2014-12-01 ENCOUNTER — Inpatient Hospital Stay (HOSPITAL_COMMUNITY): Payer: Medicare Other

## 2014-12-01 ENCOUNTER — Emergency Department (HOSPITAL_COMMUNITY): Payer: Medicare Other

## 2014-12-01 ENCOUNTER — Inpatient Hospital Stay (HOSPITAL_COMMUNITY)
Admission: EM | Admit: 2014-12-01 | Discharge: 2014-12-06 | DRG: 628 | Disposition: A | Discharge status: Home / Self Care | Payer: Medicare Other | Attending: Internal Medicine | Admitting: Internal Medicine

## 2014-12-01 ENCOUNTER — Encounter (HOSPITAL_COMMUNITY): Payer: Self-pay

## 2014-12-01 DIAGNOSIS — N2581 Secondary hyperparathyroidism of renal origin: Secondary | ICD-10-CM | POA: Diagnosis present

## 2014-12-01 DIAGNOSIS — T82838A Hemorrhage of vascular prosthetic devices, implants and grafts, initial encounter: Secondary | ICD-10-CM | POA: Diagnosis present

## 2014-12-01 DIAGNOSIS — E1010 Type 1 diabetes mellitus with ketoacidosis without coma: Secondary | ICD-10-CM | POA: Diagnosis present

## 2014-12-01 DIAGNOSIS — T86891 Other transplanted tissue failure: Secondary | ICD-10-CM | POA: Diagnosis present

## 2014-12-01 DIAGNOSIS — E871 Hypo-osmolality and hyponatremia: Secondary | ICD-10-CM | POA: Diagnosis present

## 2014-12-01 DIAGNOSIS — E10319 Type 1 diabetes mellitus with unspecified diabetic retinopathy without macular edema: Secondary | ICD-10-CM | POA: Diagnosis present

## 2014-12-01 DIAGNOSIS — E860 Dehydration: Secondary | ICD-10-CM | POA: Diagnosis present

## 2014-12-01 DIAGNOSIS — J45909 Unspecified asthma, uncomplicated: Secondary | ICD-10-CM | POA: Diagnosis present

## 2014-12-01 DIAGNOSIS — Z794 Long term (current) use of insulin: Secondary | ICD-10-CM | POA: Diagnosis not present

## 2014-12-01 DIAGNOSIS — M898X9 Other specified disorders of bone, unspecified site: Secondary | ICD-10-CM | POA: Diagnosis present

## 2014-12-01 DIAGNOSIS — J439 Emphysema, unspecified: Secondary | ICD-10-CM | POA: Diagnosis present

## 2014-12-01 DIAGNOSIS — I10 Essential (primary) hypertension: Secondary | ICD-10-CM | POA: Diagnosis present

## 2014-12-01 DIAGNOSIS — F329 Major depressive disorder, single episode, unspecified: Secondary | ICD-10-CM | POA: Diagnosis present

## 2014-12-01 DIAGNOSIS — R05 Cough: Secondary | ICD-10-CM | POA: Diagnosis present

## 2014-12-01 DIAGNOSIS — Z9483 Pancreas transplant status: Secondary | ICD-10-CM

## 2014-12-01 DIAGNOSIS — I639 Cerebral infarction, unspecified: Secondary | ICD-10-CM | POA: Diagnosis present

## 2014-12-01 DIAGNOSIS — R059 Cough, unspecified: Secondary | ICD-10-CM

## 2014-12-01 DIAGNOSIS — E1110 Type 2 diabetes mellitus with ketoacidosis without coma: Secondary | ICD-10-CM | POA: Diagnosis present

## 2014-12-01 DIAGNOSIS — J209 Acute bronchitis, unspecified: Secondary | ICD-10-CM | POA: Diagnosis present

## 2014-12-01 DIAGNOSIS — R000 Tachycardia, unspecified: Secondary | ICD-10-CM | POA: Diagnosis present

## 2014-12-01 DIAGNOSIS — E1043 Type 1 diabetes mellitus with diabetic autonomic (poly)neuropathy: Secondary | ICD-10-CM | POA: Diagnosis present

## 2014-12-01 DIAGNOSIS — Z79899 Other long term (current) drug therapy: Secondary | ICD-10-CM

## 2014-12-01 DIAGNOSIS — T8612 Kidney transplant failure: Secondary | ICD-10-CM | POA: Diagnosis present

## 2014-12-01 DIAGNOSIS — G2581 Restless legs syndrome: Secondary | ICD-10-CM | POA: Diagnosis present

## 2014-12-01 DIAGNOSIS — E10649 Type 1 diabetes mellitus with hypoglycemia without coma: Secondary | ICD-10-CM | POA: Diagnosis not present

## 2014-12-01 DIAGNOSIS — K219 Gastro-esophageal reflux disease without esophagitis: Secondary | ICD-10-CM | POA: Diagnosis present

## 2014-12-01 DIAGNOSIS — Y830 Surgical operation with transplant of whole organ as the cause of abnormal reaction of the patient, or of later complication, without mention of misadventure at the time of the procedure: Secondary | ICD-10-CM | POA: Diagnosis present

## 2014-12-01 DIAGNOSIS — Z992 Dependence on renal dialysis: Secondary | ICD-10-CM

## 2014-12-01 DIAGNOSIS — E785 Hyperlipidemia, unspecified: Secondary | ICD-10-CM | POA: Diagnosis present

## 2014-12-01 DIAGNOSIS — N186 End stage renal disease: Secondary | ICD-10-CM | POA: Diagnosis present

## 2014-12-01 DIAGNOSIS — Z7982 Long term (current) use of aspirin: Secondary | ICD-10-CM

## 2014-12-01 DIAGNOSIS — I4581 Long QT syndrome: Secondary | ICD-10-CM | POA: Diagnosis present

## 2014-12-01 DIAGNOSIS — Z940 Kidney transplant status: Secondary | ICD-10-CM

## 2014-12-01 DIAGNOSIS — K224 Dyskinesia of esophagus: Secondary | ICD-10-CM | POA: Diagnosis present

## 2014-12-01 DIAGNOSIS — Z8673 Personal history of transient ischemic attack (TIA), and cerebral infarction without residual deficits: Secondary | ICD-10-CM

## 2014-12-01 DIAGNOSIS — K3184 Gastroparesis: Secondary | ICD-10-CM | POA: Diagnosis present

## 2014-12-01 DIAGNOSIS — I6350 Cerebral infarction due to unspecified occlusion or stenosis of unspecified cerebral artery: Secondary | ICD-10-CM | POA: Diagnosis present

## 2014-12-01 DIAGNOSIS — I120 Hypertensive chronic kidney disease with stage 5 chronic kidney disease or end stage renal disease: Secondary | ICD-10-CM | POA: Diagnosis present

## 2014-12-01 DIAGNOSIS — E0810 Diabetes mellitus due to underlying condition with ketoacidosis without coma: Secondary | ICD-10-CM | POA: Diagnosis not present

## 2014-12-01 DIAGNOSIS — D649 Anemia, unspecified: Secondary | ICD-10-CM | POA: Diagnosis present

## 2014-12-01 DIAGNOSIS — J189 Pneumonia, unspecified organism: Secondary | ICD-10-CM

## 2014-12-01 DIAGNOSIS — E162 Hypoglycemia, unspecified: Secondary | ICD-10-CM | POA: Diagnosis not present

## 2014-12-01 DIAGNOSIS — E875 Hyperkalemia: Secondary | ICD-10-CM | POA: Diagnosis present

## 2014-12-01 HISTORY — DX: Type 2 diabetes mellitus with ketoacidosis without coma: E11.10

## 2014-12-01 LAB — CBC WITH DIFFERENTIAL/PLATELET
BASOS ABS: 0 10*3/uL (ref 0.0–0.1)
BASOS PCT: 0 % (ref 0–1)
Eosinophils Absolute: 0 10*3/uL (ref 0.0–0.7)
Eosinophils Relative: 0 % (ref 0–5)
HEMATOCRIT: 35.3 % — AB (ref 36.0–46.0)
Hemoglobin: 10.8 g/dL — ABNORMAL LOW (ref 12.0–15.0)
Lymphocytes Relative: 11 % — ABNORMAL LOW (ref 12–46)
Lymphs Abs: 1 10*3/uL (ref 0.7–4.0)
MCH: 31.7 pg (ref 26.0–34.0)
MCHC: 30.6 g/dL (ref 30.0–36.0)
MCV: 103.5 fL — ABNORMAL HIGH (ref 78.0–100.0)
Monocytes Absolute: 0.4 10*3/uL (ref 0.1–1.0)
Monocytes Relative: 4 % (ref 3–12)
NEUTROS ABS: 7.5 10*3/uL (ref 1.7–7.7)
Neutrophils Relative %: 84 % — ABNORMAL HIGH (ref 43–77)
Platelets: 108 10*3/uL — ABNORMAL LOW (ref 150–400)
RBC: 3.41 MIL/uL — ABNORMAL LOW (ref 3.87–5.11)
RDW: 15.7 % — AB (ref 11.5–15.5)
WBC: 8.9 10*3/uL (ref 4.0–10.5)

## 2014-12-01 LAB — CBG MONITORING, ED

## 2014-12-01 LAB — GLUCOSE, CAPILLARY
GLUCOSE-CAPILLARY: 495 mg/dL — AB (ref 65–99)
GLUCOSE-CAPILLARY: 57 mg/dL — AB (ref 65–99)
Glucose-Capillary: 102 mg/dL — ABNORMAL HIGH (ref 65–99)
Glucose-Capillary: 107 mg/dL — ABNORMAL HIGH (ref 65–99)
Glucose-Capillary: 218 mg/dL — ABNORMAL HIGH (ref 65–99)
Glucose-Capillary: 354 mg/dL — ABNORMAL HIGH (ref 65–99)
Glucose-Capillary: >600 mg/dL (ref 65–99)
Glucose-Capillary: >600 mg/dL (ref 65–99)

## 2014-12-01 LAB — COMPREHENSIVE METABOLIC PANEL
ALBUMIN: 3.5 g/dL (ref 3.5–5.0)
ALT: UNDETERMINED U/L (ref 14–54)
AST: 64 U/L — AB (ref 15–41)
Alkaline Phosphatase: 134 U/L — ABNORMAL HIGH (ref 38–126)
Anion gap: 28 — ABNORMAL HIGH (ref 5–15)
BUN: 38 mg/dL — AB (ref 6–20)
CO2: 11 mmol/L — ABNORMAL LOW (ref 22–32)
Calcium: 7.8 mg/dL — ABNORMAL LOW (ref 8.9–10.3)
Chloride: 83 mmol/L — ABNORMAL LOW (ref 101–111)
Creatinine, Ser: 8.96 mg/dL — ABNORMAL HIGH (ref 0.44–1.00)
GFR calc Af Amer: 6 mL/min — ABNORMAL LOW (ref >60)
GFR calc non Af Amer: 5 mL/min — ABNORMAL LOW (ref >60)
GLUCOSE: 1000 mg/dL — AB (ref 65–99)
POTASSIUM: 6.5 mmol/L — AB (ref 3.5–5.1)
Sodium: 122 mmol/L — ABNORMAL LOW (ref 135–145)
Total Bilirubin: UNDETERMINED mg/dL (ref 0.3–1.2)
Total Protein: 7.6 g/dL (ref 6.5–8.1)

## 2014-12-01 LAB — BASIC METABOLIC PANEL
ANION GAP: 24 — AB (ref 5–15)
Anion gap: 15 (ref 5–15)
BUN: 43 mg/dL — ABNORMAL HIGH (ref 6–20)
BUN: 43 mg/dL — AB (ref 6–20)
CALCIUM: 7.5 mg/dL — AB (ref 8.9–10.3)
CALCIUM: 7.7 mg/dL — AB (ref 8.9–10.3)
CO2: 14 mmol/L — ABNORMAL LOW (ref 22–32)
CO2: 24 mmol/L (ref 22–32)
CREATININE: 9.65 mg/dL — AB (ref 0.44–1.00)
Chloride: 86 mmol/L — ABNORMAL LOW (ref 101–111)
Chloride: 89 mmol/L — ABNORMAL LOW (ref 101–111)
Creatinine, Ser: 9.78 mg/dL — ABNORMAL HIGH (ref 0.44–1.00)
GFR calc Af Amer: 5 mL/min — ABNORMAL LOW (ref >60)
GFR calc non Af Amer: 4 mL/min — ABNORMAL LOW (ref >60)
GFR, EST AFRICAN AMERICAN: 5 mL/min — AB (ref >60)
GFR, EST NON AFRICAN AMERICAN: 4 mL/min — AB (ref >60)
Glucose, Bld: 910 mg/dL (ref 65–99)
Glucose, Bld: 460 mg/dL — ABNORMAL HIGH (ref 65–99)
Potassium: 5.1 mmol/L (ref 3.5–5.1)
Potassium: 3.6 mmol/L (ref 3.5–5.1)
SODIUM: 124 mmol/L — AB (ref 135–145)
Sodium: 128 mmol/L — ABNORMAL LOW (ref 135–145)

## 2014-12-01 LAB — I-STAT VENOUS BLOOD GAS, ED
ACID-BASE DEFICIT: 10 mmol/L — AB (ref 0.0–2.0)
Bicarbonate: 16.4 mEq/L — ABNORMAL LOW (ref 20.0–24.0)
O2 Saturation: 64 %
PH VEN: 7.247 — AB (ref 7.250–7.300)
TCO2: 18 mmol/L (ref 0–100)
pCO2, Ven: 37.7 mmHg — ABNORMAL LOW (ref 45.0–50.0)
pO2, Ven: 39 mmHg (ref 30.0–45.0)

## 2014-12-01 LAB — MRSA PCR SCREENING: MRSA by PCR: INVALID — AB

## 2014-12-01 LAB — LACTIC ACID, PLASMA: LACTIC ACID, VENOUS: 3.7 mmol/L — AB (ref 0.5–2.0)

## 2014-12-01 LAB — INFLUENZA PANEL BY PCR (TYPE A & B)
H1N1FLUPCR: NOT DETECTED
INFLBPCR: NEGATIVE
Influenza A By PCR: NEGATIVE

## 2014-12-01 LAB — I-STAT CG4 LACTIC ACID, ED: Lactic Acid, Venous: 3.35 mmol/L (ref 0.5–2.0)

## 2014-12-01 MED ORDER — DOXYCYCLINE HYCLATE 100 MG PO TABS
100.0000 mg | ORAL_TABLET | Freq: Two times a day (BID) | ORAL | Status: DC
  Administered 2014-12-01: 100 mg via ORAL
  Filled 2014-12-01: qty 1

## 2014-12-01 MED ORDER — SODIUM CHLORIDE 0.9 % IV SOLN: INTRAVENOUS | Status: AC

## 2014-12-01 MED ORDER — DOXERCALCIFEROL 4 MCG/2ML IV SOLN
3.0000 ug | INTRAVENOUS | Status: DC
  Administered 2014-12-02: 3 ug via INTRAVENOUS
  Filled 2014-12-01 (×2): qty 2

## 2014-12-01 MED ORDER — ASPIRIN 325 MG PO TABS
325.0000 mg | ORAL_TABLET | Freq: Every day | ORAL | Status: DC
  Administered 2014-12-02 – 2014-12-06 (×5): 325 mg via ORAL
  Filled 2014-12-01 (×5): qty 1

## 2014-12-01 MED ORDER — ACETAMINOPHEN 325 MG PO TABS
650.0000 mg | ORAL_TABLET | Freq: Four times a day (QID) | ORAL | Status: DC | PRN
  Administered 2014-12-01 – 2014-12-02 (×2): 650 mg via ORAL
  Filled 2014-12-01 (×3): qty 2

## 2014-12-01 MED ORDER — PANTOPRAZOLE SODIUM 40 MG PO TBEC
40.0000 mg | DELAYED_RELEASE_TABLET | Freq: Every day | ORAL | Status: DC
  Administered 2014-12-01 – 2014-12-05 (×5): 40 mg via ORAL
  Filled 2014-12-01 (×5): qty 1

## 2014-12-01 MED ORDER — SODIUM CHLORIDE 0.9 % IV SOLN
1000.0000 mL | INTRAVENOUS | Status: DC
  Administered 2014-12-01 – 2014-12-03 (×2): 1000 mL via INTRAVENOUS

## 2014-12-01 MED ORDER — RENA-VITE PO TABS: 1.0000 | ORAL_TABLET | Freq: Every day | ORAL | Status: DC

## 2014-12-01 MED ORDER — SODIUM CHLORIDE 0.9 % IV SOLN
INTRAVENOUS | Status: DC
  Administered 2014-12-01: 5.4 [IU]/h via INTRAVENOUS
  Filled 2014-12-01: qty 2.5

## 2014-12-01 MED ORDER — SODIUM CHLORIDE 0.9 % IV SOLN
1000.0000 mL | INTRAVENOUS | Status: DC
  Administered 2014-12-01: 1000 mL via INTRAVENOUS

## 2014-12-01 MED ORDER — ENOXAPARIN SODIUM 30 MG/0.3ML ~~LOC~~ SOLN
30.0000 mg | SUBCUTANEOUS | Status: DC
  Administered 2014-12-01: 30 mg via SUBCUTANEOUS
  Filled 2014-12-01 (×7): qty 0.3

## 2014-12-01 MED ORDER — DOXERCALCIFEROL 4 MCG/2ML IV SOLN: 2.0000 ug | INTRAVENOUS | Status: DC

## 2014-12-01 MED ORDER — DEXTROSE-NACL 5-0.45 % IV SOLN
INTRAVENOUS | Status: DC
  Administered 2014-12-01 – 2014-12-02 (×2): via INTRAVENOUS

## 2014-12-01 MED ORDER — SODIUM CHLORIDE 0.9 % IV SOLN: 1000.0000 mL | INTRAVENOUS | Status: DC

## 2014-12-01 MED ORDER — SODIUM CHLORIDE 0.9 % IV SOLN: INTRAVENOUS | Status: DC

## 2014-12-01 MED ORDER — LIDOCAINE HCL 1 % IJ SOLN
INTRAMUSCULAR | Status: AC
Start: 2014-12-01 — End: 2014-12-02
  Filled 2014-12-01: qty 20

## 2014-12-01 MED ORDER — RENA-VITE PO TABS
1.0000 | ORAL_TABLET | Freq: Every day | ORAL | Status: DC
  Administered 2014-12-01 – 2014-12-05 (×5): 1 via ORAL
  Filled 2014-12-01 (×7): qty 1

## 2014-12-01 MED ORDER — GABAPENTIN 100 MG PO CAPS
100.0000 mg | ORAL_CAPSULE | Freq: Two times a day (BID) | ORAL | Status: DC
  Administered 2014-12-01 – 2014-12-06 (×10): 100 mg via ORAL
  Filled 2014-12-01 (×12): qty 1

## 2014-12-01 MED ORDER — CALCIUM ACETATE (PHOS BINDER) 667 MG PO CAPS
667.0000 mg | ORAL_CAPSULE | Freq: Two times a day (BID) | ORAL | Status: DC
  Administered 2014-12-02 – 2014-12-06 (×7): 667 mg via ORAL
  Filled 2014-12-01 (×14): qty 1

## 2014-12-01 MED ORDER — IPRATROPIUM-ALBUTEROL 0.5-2.5 (3) MG/3ML IN SOLN
3.0000 mL | Freq: Four times a day (QID) | RESPIRATORY_TRACT | Status: DC
  Administered 2014-12-01: 3 mL via RESPIRATORY_TRACT
  Filled 2014-12-01 (×2): qty 3

## 2014-12-01 MED ORDER — MONTELUKAST SODIUM 10 MG PO TABS
10.0000 mg | ORAL_TABLET | Freq: Every day | ORAL | Status: DC
  Administered 2014-12-01 – 2014-12-05 (×5): 10 mg via ORAL
  Filled 2014-12-01 (×7): qty 1

## 2014-12-01 MED ORDER — DEXTROSE-NACL 5-0.45 % IV SOLN: INTRAVENOUS | Status: DC

## 2014-12-01 MED ORDER — METOPROLOL SUCCINATE ER 100 MG PO TB24
100.0000 mg | ORAL_TABLET | Freq: Every day | ORAL | Status: DC
  Administered 2014-12-01: 100 mg via ORAL
  Filled 2014-12-01 (×3): qty 1

## 2014-12-01 MED ORDER — SIMVASTATIN 20 MG PO TABS
20.0000 mg | ORAL_TABLET | Freq: Every day | ORAL | Status: DC
  Administered 2014-12-01 – 2014-12-05 (×5): 20 mg via ORAL
  Filled 2014-12-01 (×7): qty 1

## 2014-12-01 MED ORDER — METOCLOPRAMIDE HCL 5 MG PO TABS
5.0000 mg | ORAL_TABLET | Freq: Three times a day (TID) | ORAL | Status: DC
  Administered 2014-12-01 – 2014-12-05 (×12): 5 mg via ORAL
  Filled 2014-12-01 (×22): qty 1

## 2014-12-01 MED ORDER — DOXYCYCLINE HYCLATE 100 MG PO TABS
100.0000 mg | ORAL_TABLET | Freq: Two times a day (BID) | ORAL | Status: DC
  Administered 2014-12-02 – 2014-12-06 (×8): 100 mg via ORAL
  Filled 2014-12-01 (×13): qty 1

## 2014-12-01 MED ORDER — CLONAZEPAM 0.5 MG PO TABS
0.5000 mg | ORAL_TABLET | Freq: Two times a day (BID) | ORAL | Status: DC
  Administered 2014-12-01 – 2014-12-06 (×10): 0.5 mg via ORAL
  Filled 2014-12-01 (×10): qty 1

## 2014-12-01 MED ORDER — SODIUM CHLORIDE 0.9 % IV SOLN
INTRAVENOUS | Status: DC
  Administered 2014-12-01: 0.8 [IU]/h via INTRAVENOUS
  Administered 2014-12-01: 13.1 [IU]/h via INTRAVENOUS
  Administered 2014-12-01: 10.8 [IU]/h via INTRAVENOUS
  Administered 2014-12-01: 8.8 [IU]/h via INTRAVENOUS
  Administered 2014-12-01: 4.7 [IU]/h via INTRAVENOUS
  Administered 2014-12-01: 16.2 [IU]/h via INTRAVENOUS
  Administered 2014-12-02: 1.1 [IU]/h via INTRAVENOUS
  Administered 2014-12-02: 1.9 [IU]/h via INTRAVENOUS
  Administered 2014-12-02: 2.7 [IU]/h via INTRAVENOUS
  Administered 2014-12-02: 0.5 [IU]/h via INTRAVENOUS
  Administered 2014-12-02: 0.9 [IU]/h via INTRAVENOUS
  Administered 2014-12-02: 1.3 [IU]/h via INTRAVENOUS
  Administered 2014-12-02: 0.8 [IU]/h via INTRAVENOUS
  Filled 2014-12-01: qty 2.5

## 2014-12-01 MED ORDER — FENTANYL CITRATE (PF) 100 MCG/2ML IJ SOLN
50.0000 ug | Freq: Once | INTRAMUSCULAR | Status: AC
  Administered 2014-12-01: 50 ug via INTRAVENOUS
  Filled 2014-12-01: qty 2

## 2014-12-01 MED ORDER — ONDANSETRON HCL 4 MG/2ML IJ SOLN
4.0000 mg | Freq: Three times a day (TID) | INTRAMUSCULAR | Status: AC | PRN
Start: 2014-12-01 — End: 2014-12-02

## 2014-12-01 MED ORDER — DARBEPOETIN ALFA 40 MCG/0.4ML IJ SOSY
40.0000 ug | PREFILLED_SYRINGE | INTRAMUSCULAR | Status: DC
  Administered 2014-12-02: 40 ug via INTRAVENOUS

## 2014-12-01 MED ORDER — ZOLPIDEM TARTRATE 5 MG PO TABS
5.0000 mg | ORAL_TABLET | Freq: Every evening | ORAL | Status: DC | PRN
  Administered 2014-12-02 – 2014-12-04 (×2): 5 mg via ORAL
  Filled 2014-12-01 (×2): qty 1

## 2014-12-01 NOTE — Consult Note (Signed)
Nephrology Consultation Indication for Consultation:  Management of ESRD/hemodialysis; anemia, hypertension/volume and secondary hyperparathyroidism  HPI: [name] is a 44 y.o. female who presented to the ED this AM with complaints of fever, productive cough and body aches, she was in contact with another pt at her outpt HD unit being treated for pneumonia and was concerned she might now have pneumonia. She recieves HD [facility], history of HTN, DM with multiple admissions for DKA, failed kidney/panc transplant off all immunosuppression. She reports a 3 day history of fevers, weakness, generalized body aches and productive cough. She reports her glucose has been running high for a few days, has not missed any doses of insulin but her glucometer is broke yesterday so she was unable to check her glucose at home but it was "high" on the meter yesterday at HD. She was admitted for management of DKA, will arrange HD while admitted.   Past Medical History  Diagnosis Date  . Hyperlipidemia   . Alopecia   . Obesity   . Iron deficiency   . ESRD (end stage renal disease)     S/p pancreatic and kidney transplant, but back on HD 2008, dialysis at [facility], [place], Sat  . RLS (restless legs syndrome)   . Injury of conjunctiva and corneal abrasion of right eye without foreign body   . Cellulitis   . Anemia   . Blood transfusion 2004    "when I had my transplant"  . Migraine   . Hyperparathyroidism, secondary   . Diabetic retinopathy   . Hypertension     takes Metoprolol and Exforge daily  . Depression     takes Klonopin nightly  . Diabetes mellitus type 1     Pt states diagnosed at age 45 with prior episodes of DKA. S/p pancreatic transplant   . GERD (gastroesophageal reflux disease)     takes Protonix daily  . Bronchitis   . Migraine   . Asthma   . Emphysema of lung   . Angina     none in past 2 years.  . Stomach pain     Hx: of chronic  . Pneumonia 2012    "double"  .  Dialysis patient     tues,thurs,sat  . 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  Surgery was done at [facility].      Past Surgical History  Procedure Laterality Date  . 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 Thyroglobulin      x 7  . Av fistula placement      right upper arm  . Cholecystectomy  1995  .  hd graft placement/removal      "had 2 in my left upper arm"  . Eye surgery    . Retinopathy surgery    . Tooth extraction  10/10/11    X's two  . Insertion of dialysis catheter  12/08/2011    Procedure: INSERTION OF DIALYSIS CATHETER;  Surgeon: Angelia Mould, MD;  Location: [place];  Service: Vascular;  Laterality: Left;  . Av fistula placement  01/22/2012    Procedure: INSERTION OF ARTERIOVENOUS (AV) GORE-TEX GRAFT ARM;  Surgeon: Angelia Mould, MD;  Location: [place];  Service: Vascular;  Laterality: Left;  Ultrasound guided.  Marland Kitchen  Av fistula placement  03/14/2012    Procedure: INSERTION OF ARTERIOVENOUS (AV) GORE-TEX GRAFT THIGH;  Surgeon: Angelia Mould, MD;  Location: [place];  Service: Vascular;  Laterality: Right;  . False aneurysm repair Right 01/02/2013    Procedure: Excision of lymphocele in right thigh.;  Surgeon: Angelia Mould, MD;  Location: [place];  Service: Vascular;  Laterality: Right;  . Carpal tunnel release Right 03/02/2013    Procedure: CARPAL TUNNEL RELEASE;  Surgeon: Tennis Must, MD;  Location: [place];  Service: Orthopedics;  Laterality: Right;  . Flexible sigmoidoscopy N/A 03/24/2013    Procedure: FLEXIBLE SIGMOIDOSCOPY;  Surgeon: Cleotis Nipper, MD;  Location: [facility] ENDOSCOPY;  Service: Endoscopy;  Laterality: N/A;  . Revision of arteriovenous goretex graft Right 04/02/2013    Procedure:  REPAIR OF PSEUDOANEURYSM OF ARTERIOVENOUS GORETEX GRAFT;  Surgeon: Angelia Mould, MD;  Location: [place];  Service: Vascular;  Laterality: Right;  . Carpal tunnel release Left 10/16/2013    Procedure: LEFT CARPAL TUNNEL RELEASE;  Surgeon: Tennis Must, MD;  Location: [place];  Service: Orthopedics;  Laterality: Left;  . Unilateral upper extremeity angiogram N/A 11/12/2011    Procedure: UNILATERAL UPPER Anselmo Rod;  Surgeon: Angelia Mould, MD;  Location: [facility] CATH LAB;  Service: Cardiovascular;  Laterality: N/A;  . Fistulogram Left 03/03/2012    Procedure: FISTULOGRAM;  Surgeon: Angelia Mould, MD;  Location: [facility] CATH LAB;  Service: Cardiovascular;  Laterality: Left;   Family History  Problem Relation Age of Onset  . Hypertension Mother   . Kidney disease Mother   . Diabetes Mother   . Hypertension Father   . Kidney disease Father   . Diabetes Father    Social History:  reports that she has never smoked. She has never used smokeless tobacco. She reports that she does not drink alcohol or use illicit drugs. Allergies  Allergen Reactions  . Depakote [Divalproex Sodium] Other (See Comments)    Pt gets "delirious"  . Morphine And Related Nausea And Vomiting and Other (See Comments)    "makes me delirious"  . Penicillins Anaphylaxis    Tolerated Zosyn Dec 2014  . Tramadol Nausea And Vomiting  . Imitrex [Sumatriptan] Other (See Comments)    seizures  . Vicodin [Hydrocodone-Acetaminophen] Itching and Rash   Prior to Admission medications   Medication Sig Start Date End Date Taking? Authorizing Provider  albuterol (PROVENTIL HFA;VENTOLIN HFA) 108 (90 BASE) MCG/ACT inhaler Inhale 2 puffs into the lungs every 4 (four) hours as needed for wheezing or shortness of breath (as per home regimen). 02/20/13   Kinnie Feil, MD  aspirin 325 MG tablet Take 1 tablet (325 mg total) by mouth daily. Patient not taking: Reported on 11/10/2014 05/20/14   Velvet Bathe, MD  calcium acetate (PHOSLO) 667 MG capsule Take 1 capsule (667 mg total) by mouth 2 (two) times daily. 11/16/14   Theodis Blaze, MD  clonazePAM (KLONOPIN) 0.5 MG tablet Take 0.5 mg by mouth 2 (two) times daily.     Historical Provider, MD  Darbepoetin Alfa (ARANESP) 40 MCG/0.4ML SOSY injection Inject 0.4 mLs (40 mcg total) into the vein every Thursday with hemodialysis. 08/05/14   Geradine Girt, DO  doxercalciferol (HECTOROL) 4 MCG/2ML injection Inject 1 mL (2 mcg total) into the vein Every Tuesday,Thursday,and Saturday with dialysis. 08/05/14   Geradine Girt, DO  gabapentin (NEURONTIN) 100 MG capsule Take 100 mg by mouth 2 (two) times daily.  Historical Provider, MD  insulin aspart (NOVOLOG) 100 UNIT/ML injection Inject 7 Units into the skin 3 (three) times daily with meals. 08/05/14   Geradine Girt, DO  insulin glargine (LANTUS) 100 UNIT/ML injection Inject 0.07 mLs (7 Units total) into the skin 2 (two) times daily. 08/05/14   Geradine Girt, DO  lidocaine (LIDODERM) 5 % Place 1 patch onto the skin daily. Remove & Discard patch within 12 hours or as directed by MD 05/04/14   Liam Graham, PA-C  metoCLOPramide (REGLAN) 5 MG tablet Take 1 tablet (5 mg total) by mouth 4 (four) times daily -  before meals and at bedtime. 08/05/14   Geradine Girt, DO  metoprolol succinate (TOPROL-XL) 100 MG 24 hr tablet Take 1 tablet (100 mg total) by mouth daily. Take with or immediately following a meal. 05/20/14   Velvet Bathe, MD  montelukast (SINGULAIR) 10 MG tablet Take 10 mg by mouth at bedtime.    Historical Provider, MD  multivitamin (RENA-VIT) TABS tablet Take 1 tablet by mouth daily. 08/05/14   Geradine Girt, DO  Nutritional Supplements (FEEDING SUPPLEMENT, NEPRO CARB STEADY,) LIQD Take 237 mLs by mouth 2 (two) times daily between meals. 04/23/14   Orson Eva, MD  pantoprazole (PROTONIX) 40 MG tablet Take 1 tablet (40 mg total) by mouth at bedtime. 08/05/14   Geradine Girt, DO  promethazine  (PHENERGAN) 25 MG tablet Take 25 mg by mouth every 6 (six) hours as needed for nausea or vomiting.    Historical Provider, MD  simvastatin (ZOCOR) 20 MG tablet Take 1 tablet (20 mg total) by mouth at bedtime. 05/20/14   Velvet Bathe, MD  zolpidem (AMBIEN) 5 MG tablet Take 5 mg by mouth at bedtime as needed. sleep 02/09/14   Historical Provider, MD   Current Facility-Administered Medications  Medication Dose Route Frequency Provider Last Rate Last Dose  . 0.9 %  sodium chloride infusion  1,000 mL Intravenous Continuous Samella Parr, NP 10 mL/hr at 12/01/14 1401 1,000 mL at 12/01/14 1401  . dextrose 5 %-0.45 % sodium chloride infusion   Intravenous Continuous Samella Parr, NP      . doxycycline (VIBRA-TABS) tablet 100 mg  100 mg Oral Q12H Samella Parr, NP      . insulin regular (NOVOLIN R,HUMULIN R) 250 Units in sodium chloride 0.9 % 250 mL (1 Units/mL) infusion   Intravenous Continuous Carmin Muskrat, MD 10.8 mL/hr at 12/01/14 1400 10.8 Units/hr at 12/01/14 1400  . ipratropium-albuterol (DUONEB) 0.5-2.5 (3) MG/3ML nebulizer solution 3 mL  3 mL Nebulization Q6H Samella Parr, NP       Current Outpatient Prescriptions  Medication Sig Dispense Refill  . albuterol (PROVENTIL HFA;VENTOLIN HFA) 108 (90 BASE) MCG/ACT inhaler Inhale 2 puffs into the lungs every 4 (four) hours as needed for wheezing or shortness of breath (as per home regimen). 1 Inhaler 0  . aspirin 325 MG tablet Take 1 tablet (325 mg total) by mouth daily. (Patient not taking: Reported on 11/10/2014) 30 tablet 0  . calcium acetate (PHOSLO) 667 MG capsule Take 1 capsule (667 mg total) by mouth 2 (two) times daily. 30 capsule 0  . clonazePAM (KLONOPIN) 0.5 MG tablet Take 0.5 mg by mouth 2 (two) times daily.     . Darbepoetin Alfa (ARANESP) 40 MCG/0.4ML SOSY injection Inject 0.4 mLs (40 mcg total) into the vein every Thursday with hemodialysis. 8.4 mL   . doxercalciferol (HECTOROL) 4 MCG/2ML injection Inject 1 mL (2 mcg  total) into  the vein Every Tuesday,Thursday,and Saturday with dialysis. 2 mL   . gabapentin (NEURONTIN) 100 MG capsule Take 100 mg by mouth 2 (two) times daily.     . insulin aspart (NOVOLOG) 100 UNIT/ML injection Inject 7 Units into the skin 3 (three) times daily with meals. 10 mL 0  . insulin glargine (LANTUS) 100 UNIT/ML injection Inject 0.07 mLs (7 Units total) into the skin 2 (two) times daily. 10 mL 11  . lidocaine (LIDODERM) 5 % Place 1 patch onto the skin daily. Remove & Discard patch within 12 hours or as directed by MD 30 patch 0  . metoCLOPramide (REGLAN) 5 MG tablet Take 1 tablet (5 mg total) by mouth 4 (four) times daily -  before meals and at bedtime. 120 tablet 0  . metoprolol succinate (TOPROL-XL) 100 MG 24 hr tablet Take 1 tablet (100 mg total) by mouth daily. Take with or immediately following a meal. 30 tablet 0  . montelukast (SINGULAIR) 10 MG tablet Take 10 mg by mouth at bedtime.    . multivitamin (RENA-VIT) TABS tablet Take 1 tablet by mouth daily. 30 tablet 0  . Nutritional Supplements (FEEDING SUPPLEMENT, NEPRO CARB STEADY,) LIQD Take 237 mLs by mouth 2 (two) times daily between meals. 60 Can 0  . pantoprazole (PROTONIX) 40 MG tablet Take 1 tablet (40 mg total) by mouth at bedtime. 30 tablet 0  . promethazine (PHENERGAN) 25 MG tablet Take 25 mg by mouth every 6 (six) hours as needed for nausea or vomiting.    . simvastatin (ZOCOR) 20 MG tablet Take 1 tablet (20 mg total) by mouth at bedtime. 30 tablet 0  . zolpidem (AMBIEN) 5 MG tablet Take 5 mg by mouth at bedtime as needed. sleep    . [DISCONTINUED] NORVASC 10 MG tablet Take 1 tablet by mouth Daily.      Review of Systems: Gen: + fever (reports as high as 102) anorexia, fatigue, weakness, generalized body aches and poor appetite. Blood sugar running high for a few days CV: Denies chest pain, angina, palpitations, syncope, orthopnea, PND, peripheral edema, and claudication. Resp: Reports productive cough with yellow sputum. Denies  dyspnea at rest, dyspnea with exercise GI: reports poor appetite GU : anuric Reports bleeding from AVG post HD/ missed appt for shuntogram this AM   Physical Exam: Filed Vitals:   12/01/14 1230 12/01/14 1245 12/01/14 1300 12/01/14 1400  BP: 166/64 155/63 147/57 145/61  Pulse: 100 97  93  Temp:      TempSrc:      Resp: 33 31 32 23  Height:      Weight:      SpO2: 97% 92%  91%     General: Well developed, well nourished, in no acute distress. Resting in bed Head: Normocephalic, atraumatic, sclera non-icteric, mucus membranes are moist Neck: Supple. JVD not elevated. Lungs: unlabored, exp wheeze Heart: RRR with S1 S2. No murmurs, rubs, or gallops appreciated. Abdomen: Soft, non-tender, non-distended with normoactive bowel sounds. No rebound/guarding. No obvious abdominal masses. M-S:  Strength and tone appear normal for age. Lower extremities:without edema or ischemic changes, no open wounds  Neuro: Alert and oriented X 3. Moves all extremities spontaneously. Psych:  Responds to questions appropriately with a normal affect. Dialysis Access: R thigh AVG  Labs: Basic Metabolic Panel:  Recent Labs Lab 12/01/14 1017  NA 122*  K 6.5*  CL 83*  CO2 11*  GLUCOSE 1000*  BUN 38*  CREATININE 8.96*  CALCIUM 7.8*  Liver Function Tests:  Recent Labs Lab 12/01/14 1017  AST 64*  ALT QUANTITY NOT SUFFICIENT, UNABLE TO PERFORM TEST  ALKPHOS 134*  BILITOT QUANTITY NOT SUFFICIENT, UNABLE TO PERFORM TEST  PROT 7.6  ALBUMIN 3.5   No results for input(s): LIPASE, AMYLASE in the last 168 hours. No results for input(s): AMMONIA in the last 168 hours. CBC:  Recent Labs Lab 12/01/14 1017  WBC 8.9  NEUTROABS 7.5  HGB 10.8*  HCT 35.3*  MCV 103.5*  PLT 108*     Recent Labs Lab 12/01/14 1134 12/01/14 1357  GLUCAP >600* >600*   Iron Studies: No results for input(s): IRON, TIBC, TRANSFERRIN, FERRITIN in the last 72 hours. Studies/Results: Dg Chest 2 View  12/01/2014    CLINICAL DATA:  Cough fever congestion and body aches associated with nausea vomiting and diarrhea for the past 2-3 days; history of COPD -asthma, diabetes, and kidney and pancreatic transplant  EXAM: CHEST  2 VIEW  COMPARISON:  Portable chest x-ray of November 10, 2014  FINDINGS: Top-normal in size. The pulmonary vascularity is less prominent today but remains mildly engorged. The mediastinum is normal in width. The bony thorax is unremarkable.  IMPRESSION: Mild pulmonary interstitial edema, improved since the previous study. There is no alveolar pneumonia.   Electronically Signed   By: David  Martinique M.D.   On: 12/01/2014 11:08   Dialysis Orders: [facility] 3hr 45 mins    68kgs   2K/2.5ca   R thigh AVG   No heparin Hectorol 3 Micera 150 q 2 weeks- last dose given 6/30  Assessment/Plan: 1.  DKA- glucose 1000, insulin gtt started. Blood glucose registered 'high' yesterday at HD and 584 on 7/2- repeated admissions for DKA- per primary 2. Acute bronchitis- doxycycline started/ influenza and blood cultures pending 3.  ESRD -  [facility]- HD pending tomorrow/ hyperkalemia in the setting of DKA- repeating labs 4.  Hypertension/volume  -home norvasc and toprol. gets to edw or slightly under at times/ high gains.  5.  Anemia  - hgb 10.8- last dose of micera given 6/30- due next week. No Fe- tsat 37 6.  Metabolic bone disease -  last phos 5.6 and PTH 217/ cont hectorol and phoslo.  7.  Nutrition - NPO/ vitamin 8. Bleeding AVG post HD- arrange fistulogram while admitted with IR 9. Hyperkalemia - due to glucose shifts. Should come down as BS does. Has Q4H BMet ordered  Shelle Iron, NP [facility] [phone] 12/01/2014, 2:09 PM   I have seen and examined this patient and agree with plan and assessment as in the above note. One of numerous admission for DKA in pt with ESRD and failed kidney-panceas transplant, on TTS HD at [facility]. This time states "meter broken". K is high but should come down with  reduction in her blood sugar.  Deaunte Dente B,MD 12/01/2014 4:15 PM

## 2014-12-01 NOTE — ED Notes (Signed)
Pt has returned from being out of the department at this time; pt placed back on continuous pulse oximetry and blood pressure cuff; warm blanket given

## 2014-12-01 NOTE — Progress Notes (Addendum)
CRITICAL VALUE ALERT  Critical value received:  Serum Glucose 910  Date of notification:  12/01/2014  Time of notification:  0677  Critical value read back:Yes.    Nurse who received alert:  Dorena Cookey and made primary Rn Rico Sheehan aware  MD notified (1st page):  Dr Cruzita Lederer  Time of first page:  1650  MD notified (2nd page):  Time of second page:  Responding MD:  Dr Cruzita Lederer  Time MD responded:  1700

## 2014-12-01 NOTE — ED Notes (Signed)
IV team at the bedside. 

## 2014-12-01 NOTE — ED Notes (Signed)
Dr. Vanita Panda back at the bedside to assist with IV insertion.

## 2014-12-01 NOTE — ED Provider Notes (Signed)
CSN: [id number]     Arrival date & time 12/01/14  0929 History   First MD Initiated Contact with Patient 12/01/14 [phone]     Chief Complaint  Patient presents with  . Hyperglycemia  . Cough  . Generalized Body Aches    HPI  Patient presents with concern of generalized discomfort, nausea, vomiting, fever, cough. Time of onset is unclear, but it seems as though the cough has been present for some time.  The past few days patient has developed increased nausea, generalized fatigue, as well as subjective fever. No confusion, disorientation, syncope, no chest pain, focal abdominal pain. Patient completed typical dialysis session yesterday, no complications. She states that she takes all medication as directed. No clear precipitant, no clear alleviating or exacerbating factors.   Past Medical History  Diagnosis Date  . Hyperlipidemia   . Alopecia   . Obesity   . Iron deficiency   . ESRD (end stage renal disease)     S/p pancreatic and kidney transplant, but back on HD 2008, dialysis at [facility], [place], Sat  . RLS (restless legs syndrome)   . Injury of conjunctiva and corneal abrasion of right eye without foreign body   . Cellulitis   . Anemia   . Blood transfusion 2004    "when I had my transplant"  . Migraine   . Hyperparathyroidism, secondary   . Diabetic retinopathy   . Hypertension     takes Metoprolol and Exforge daily  . Depression     takes Klonopin nightly  . Diabetes mellitus type 1     Pt states diagnosed at age 66 with prior episodes of DKA. S/p pancreatic transplant   . GERD (gastroesophageal reflux disease)     takes Protonix daily  . Bronchitis   . Migraine   . Asthma   . Emphysema of lung   . Angina     none in past 2 years.  . Stomach pain     Hx: of chronic  . Pneumonia 2012    "double"  . Dialysis patient     tues,thurs,sat  . 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  Surgery was done at [facility].      Past Surgical History  Procedure Laterality Date  . 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 Thyroglobulin      x 7  . Av fistula placement      right upper arm  . Cholecystectomy  1995  .  hd graft placement/removal      "had 2 in my left upper arm"  . Eye surgery    . Retinopathy surgery    . Tooth extraction  10/10/11    X's two  . Insertion of dialysis catheter  12/08/2011    Procedure: INSERTION OF DIALYSIS CATHETER;  Surgeon: Angelia Mould, MD;  Location: [place];  Service: Vascular;  Laterality: Left;  . Av fistula placement  01/22/2012    Procedure: INSERTION OF ARTERIOVENOUS (AV) GORE-TEX GRAFT ARM;  Surgeon: Angelia Mould, MD;  Location: [place];  Service: Vascular;  Laterality: Left;  Ultrasound guided.  . Av fistula placement  03/14/2012    Procedure: INSERTION OF ARTERIOVENOUS (AV) GORE-TEX GRAFT THIGH;  Surgeon: Angelia Mould, MD;  Location: [facility];  Service: Vascular;  Laterality: Right;  . False aneurysm repair Right 01/02/2013    Procedure: Excision of lymphocele in right thigh.;  Surgeon: Angelia Mould, MD;  Location: [place];  Service: Vascular;  Laterality: Right;  . Carpal tunnel release Right 03/02/2013    Procedure: CARPAL TUNNEL RELEASE;  Surgeon: Tennis Must, MD;  Location: [place];  Service: Orthopedics;  Laterality: Right;  . Flexible sigmoidoscopy N/A 03/24/2013    Procedure: FLEXIBLE SIGMOIDOSCOPY;  Surgeon: Cleotis Nipper, MD;  Location: [facility] ENDOSCOPY;  Service: Endoscopy;  Laterality: N/A;  . Revision of arteriovenous goretex graft Right 04/02/2013    Procedure: REPAIR OF PSEUDOANEURYSM OF ARTERIOVENOUS GORETEX GRAFT;  Surgeon: Angelia Mould, MD;  Location: [place];  Service: Vascular;  Laterality: Right;  .  Carpal tunnel release Left 10/16/2013    Procedure: LEFT CARPAL TUNNEL RELEASE;  Surgeon: Tennis Must, MD;  Location: [place];  Service: Orthopedics;  Laterality: Left;  . Unilateral upper extremeity angiogram N/A 11/12/2011    Procedure: UNILATERAL UPPER Anselmo Rod;  Surgeon: Angelia Mould, MD;  Location: [facility] CATH LAB;  Service: Cardiovascular;  Laterality: N/A;  . Fistulogram Left 03/03/2012    Procedure: FISTULOGRAM;  Surgeon: Angelia Mould, MD;  Location: [facility] CATH LAB;  Service: Cardiovascular;  Laterality: Left;   Family History  Problem Relation Age of Onset  . Hypertension Mother   . Kidney disease Mother   . Diabetes Mother   . Hypertension Father   . Kidney disease Father   . Diabetes Father    History  Substance Use Topics  . Smoking status: Never Smoker   . Smokeless tobacco: Never Used  . Alcohol Use: No   OB History    No data available     Review of Systems  Constitutional:       Per HPI, otherwise negative  HENT:       Per HPI, otherwise negative  Respiratory:       Per HPI, otherwise negative  Cardiovascular:       Per HPI, otherwise negative  Gastrointestinal: Positive for nausea and vomiting.  Endocrine:       Negative aside from HPI  Genitourinary:       Neg aside from HPI   Musculoskeletal:       Per HPI, otherwise negative  Skin: Negative.   Allergic/Immunologic: Positive for immunocompromised state.       Status post kidney, pancreas transplant  Neurological: Positive for weakness. Negative for syncope.      Allergies  Depakote; Morphine and related; Penicillins; Tramadol; Imitrex; and Vicodin  Home Medications   Prior to Admission medications   Medication Sig Start Date End Date Taking? Authorizing Provider  albuterol (PROVENTIL HFA;VENTOLIN HFA) 108 (90 BASE) MCG/ACT inhaler Inhale 2 puffs into the lungs every 4 (four) hours as needed for wheezing or shortness of breath (as per home regimen).  02/20/13   Kinnie Feil, MD  aspirin 325 MG tablet Take 1 tablet (325 mg total) by mouth daily. Patient not taking: Reported on 11/10/2014 05/20/14   Velvet Bathe, MD  calcium acetate (PHOSLO) 667 MG capsule Take 1 capsule (667 mg total) by mouth 2 (two) times daily. 11/16/14   Theodis Blaze, MD  clonazePAM (KLONOPIN) 0.5 MG tablet Take 0.5 mg by mouth 2 (two) times daily.     Historical Provider, MD  Darbepoetin Alfa (ARANESP) 40 MCG/0.4ML SOSY injection Inject 0.4 mLs (40 mcg  total) into the vein every Thursday with hemodialysis. 08/05/14   Geradine Girt, DO  doxercalciferol (HECTOROL) 4 MCG/2ML injection Inject 1 mL (2 mcg total) into the vein Every Tuesday,Thursday,and Saturday with dialysis. 08/05/14   Geradine Girt, DO  gabapentin (NEURONTIN) 100 MG capsule Take 100 mg by mouth 2 (two) times daily.     Historical Provider, MD  insulin aspart (NOVOLOG) 100 UNIT/ML injection Inject 7 Units into the skin 3 (three) times daily with meals. 08/05/14   Geradine Girt, DO  insulin glargine (LANTUS) 100 UNIT/ML injection Inject 0.07 mLs (7 Units total) into the skin 2 (two) times daily. 08/05/14   Geradine Girt, DO  lidocaine (LIDODERM) 5 % Place 1 patch onto the skin daily. Remove & Discard patch within 12 hours or as directed by MD 05/04/14   Liam Graham, PA-C  metoCLOPramide (REGLAN) 5 MG tablet Take 1 tablet (5 mg total) by mouth 4 (four) times daily -  before meals and at bedtime. 08/05/14   Geradine Girt, DO  metoprolol succinate (TOPROL-XL) 100 MG 24 hr tablet Take 1 tablet (100 mg total) by mouth daily. Take with or immediately following a meal. 05/20/14   Velvet Bathe, MD  montelukast (SINGULAIR) 10 MG tablet Take 10 mg by mouth at bedtime.    Historical Provider, MD  multivitamin (RENA-VIT) TABS tablet Take 1 tablet by mouth daily. 08/05/14   Geradine Girt, DO  Nutritional Supplements (FEEDING SUPPLEMENT, NEPRO CARB STEADY,) LIQD Take 237 mLs by mouth 2 (two) times daily between meals.  04/23/14   Orson Eva, MD  pantoprazole (PROTONIX) 40 MG tablet Take 1 tablet (40 mg total) by mouth at bedtime. 08/05/14   Geradine Girt, DO  promethazine (PHENERGAN) 25 MG tablet Take 25 mg by mouth every 6 (six) hours as needed for nausea or vomiting.    Historical Provider, MD  simvastatin (ZOCOR) 20 MG tablet Take 1 tablet (20 mg total) by mouth at bedtime. 05/20/14   Velvet Bathe, MD  zolpidem (AMBIEN) 5 MG tablet Take 5 mg by mouth at bedtime as needed. sleep 02/09/14   Historical Provider, MD   BP 169/72 mmHg  Pulse 95  Temp(Src) 99.1 F (37.3 C) (Oral)  Resp 22  Ht 5\' 1"  (1.549 m)  Wt 150 lb 12.7 oz (68.4 kg)  BMI 28.51 kg/m2  SpO2 93%  LMP 11/10/2014 Physical Exam  Constitutional: She is oriented to person, place, and time. She appears well-developed and well-nourished. No distress.  HENT:  Head: Normocephalic and atraumatic.  Eyes: Conjunctivae and EOM are normal.  Cardiovascular: Regular rhythm.  Tachycardia present.   Pulmonary/Chest: Effort normal and breath sounds normal. No stridor. No respiratory distress.  Abdominal: She exhibits no distension. There is no tenderness.  Musculoskeletal: She exhibits no edema.  Neurological: She is alert and oriented to person, place, and time. No cranial nerve deficit.  Skin: Skin is warm and dry.  On each of her upper extremities, proximally there are notable scars, consistent with placement and removal of bilateral AV fistula. Patient's neck has similar scarring on both sides  Psychiatric: She has a normal mood and affect.  Nursing note and vitals reviewed.   ED Course  Procedures (including critical care time) Labs Review Labs Reviewed  CBC WITH DIFFERENTIAL/PLATELET - Abnormal; Notable for the following:    RBC 3.41 (*)    Hemoglobin 10.8 (*)    HCT 35.3 (*)    MCV 103.5 (*)    RDW  15.7 (*)    Platelets 108 (*)    Neutrophils Relative % 84 (*)    Lymphocytes Relative 11 (*)    All other components within normal limits   COMPREHENSIVE METABOLIC PANEL - Abnormal; Notable for the following:    Sodium 122 (*)    Potassium 6.5 (*)    Chloride 83 (*)    CO2 11 (*)    Glucose, Bld 1000 (*)    BUN 38 (*)    Creatinine, Ser 8.96 (*)    Calcium 7.8 (*)    AST 64 (*)    Alkaline Phosphatase 134 (*)    GFR calc non Af Amer 5 (*)    GFR calc Af Amer 6 (*)    Anion gap 28 (*)    All other components within normal limits  GLUCOSE, CAPILLARY - Abnormal; Notable for the following:    Glucose-Capillary >600 (*)    All other components within normal limits  I-STAT CG4 LACTIC ACID, ED - Abnormal; Notable for the following:    Lactic Acid, Venous 3.35 (*)    All other components within normal limits  I-STAT VENOUS BLOOD GAS, ED - Abnormal; Notable for the following:    pH, Ven 7.247 (*)    pCO2, Ven 37.7 (*)    Bicarbonate 16.4 (*)    Acid-base deficit 10.0 (*)    All other components within normal limits  CBG MONITORING, ED - Abnormal; Notable for the following:    Glucose-Capillary >600 (*)    All other components within normal limits  CBG MONITORING, ED - Abnormal; Notable for the following:    Glucose-Capillary >600 (*)    All other components within normal limits  CULTURE, BLOOD (ROUTINE X 2)  CULTURE, BLOOD (ROUTINE X 2)  MRSA PCR SCREENING  BASIC METABOLIC PANEL  LACTIC ACID, PLASMA  INFLUENZA PANEL BY PCR (TYPE A & B, H1N1)(NOT AT [facility])  BASIC METABOLIC PANEL  BASIC METABOLIC PANEL  BASIC METABOLIC PANEL  BASIC METABOLIC PANEL  HEMOGLOBIN W0J  BASIC METABOLIC PANEL    Imaging Review No results found.   EKG Interpretation   Date/Time:  Wednesday December 01 2014 09:45:27 EDT Ventricular Rate:  94 PR Interval:  198 QRS Duration: 95 QT Interval:  414 QTC Calculation: 518 R Axis:   95 Text Interpretation:  Sinus rhythm Borderline prolonged PR interval  Probable left atrial enlargement Borderline right axis deviation Left  ventricular hypertrophy Prolonged QT interval Sinus rhythm T wave   abnormality Abnormal ekg Confirmed by Carmin Muskrat  MD ([id number]) on  12/01/2014 9:58:06 AM       Cardiac 90 sinus normal EMS rhythm strip shows sinus rhythm, rate 89, T-wave changes, otherwise unremarkable  Update: Patient's labs initially notable for acidosis with pH 7.24 lactic acidosis.  Angiocath insertion Performed by: Carmin Muskrat  Consent: Verbal consent obtained. Risks and benefits: risks, benefits and alternatives were discussed Time out: Immediately prior to procedure a "time out" was called to verify the correct patient, procedure, equipment, support staff and site/side marked as required.  Preparation: Patient was prepped and draped in the usual sterile fashion.  Vein Location: L ac  Ultrasound Guided  Gauge: 20  Normal blood return and flush without difficulty Patient tolerance: Patient tolerated the procedure well with no immediate complications.   1:56 PM Patient remains tachycardic, uncomfortable appearing. IV has infiltrated. Additional IV, VI ultrasound guidance placed.  Angiocath insertion Performed by: Carmin Muskrat  Consent: Verbal consent obtained. Risks and benefits: risks, benefits  and alternatives were discussed Time out: Immediately prior to procedure a "time out" was called to verify the correct patient, procedure, equipment, support staff and site/side marked as required.  Preparation: Patient was prepped and draped in the usual sterile fashion.  Vein Location: L shoulder  Ultrasound Guided  Gauge: 20  Normal blood return and flush without difficulty Patient tolerance: Patient tolerated the procedure well with no immediate complications.   Following second IV ultrasound placement, patient received IV fluid, continuous insulin. I discussed patient's case with the admitting team again. Patient remains in discomfort, but hemodynamically consistent.   Repeat Accu-Chek remains greater than 600, in spite of fluid resuscitation,  IV insulin.  MDM  Dialysis patient with history of failed kidney, pancreas transplant, multiple other medical issues now presents with generalized complains, as well as cough, nausea, diffuse pain. Given the patient's history, DKA versus infectious etiology were initial considerations. Patient had no leukocytosis, fever, notable chest x-ray findings, and there is low suspicion for infection. However, the patient's evaluation did demonstrate notable hyperglycemia, diabetic ketoacidosis, including hyperkalemia, hyponatremia. Patient has vasculopathy, and IV access was difficult. However, the patient did receive IV fluid resuscitation, continuous insulin, analgesia 6, antiemetics in the emergency department. With initiation of resuscitation, and some improvement in her clinical condition, though with persistently critically abnormal labs, the patient required admission to the stepdown unit.  CRITICAL CARE Performed by: Carmin Muskrat Total critical care time: 45 Critical care time was exclusive of separately billable procedures and treating other patients. Critical care was necessary to treat or prevent imminent or life-threatening deterioration. Critical care was time spent personally by me on the following activities: development of treatment plan with patient and/or surrogate as well as nursing, discussions with consultants, evaluation of patient's response to treatment, examination of patient, obtaining history from patient or surrogate, ordering and performing treatments and interventions, ordering and review of laboratory studies, ordering and review of radiographic studies, pulse oximetry and re-evaluation of patient's condition.   Carmin Muskrat, MD 12/01/14 [phone]

## 2014-12-01 NOTE — ED Notes (Signed)
Per [facility], pt had dialysis yesterday and after getting home, started feeling bad, running a fever, chills, generalized body aches. She hasn't taken any insulin since yesterday because her glucometer is broke. EMS's cbg was over 550. Is a T/TH/Sat dialysis pt. Having generalized body aches. Vomiting currently. Pt states she is a hard stick and usually has to have a PICC line placed.

## 2014-12-01 NOTE — ED Notes (Signed)
Still attempting to find IV access with Dr. Vanita Panda at bedside. Some blood obtained for labs by phlebotomy

## 2014-12-01 NOTE — ED Notes (Signed)
This RN to the bedside with the u/s machine to look for IV. Unable to find vein. Dr. Vanita Panda called to come assist.

## 2014-12-01 NOTE — Procedures (Signed)
Successful RT IJ DL POWER PICC TIP SVC/RA NO COMP STABLE READY FOR USE FULL REPORT IN PACS

## 2014-12-01 NOTE — H&P (Signed)
[facility] History and Physical                                                                                    [name], is a 44 y.o. female  MRN: [id number]   DOB - April 06, 1971  Admit Date - 12/01/2014  Outpatient Primary MD for the patient is [name], Gifford Shave, MD  Referring MD: Vanita Panda / ER  Consulting M.D: Posey Pronto / Nephrology  With History of -  Past Medical History  Diagnosis Date  . Hyperlipidemia   . Alopecia   . Obesity   . Iron deficiency   . ESRD (end stage renal disease)     S/p pancreatic and kidney transplant, but back on HD 2008, dialysis at [facility], [place], Sat  . RLS (restless legs syndrome)   . Injury of conjunctiva and corneal abrasion of right eye without foreign body   . Cellulitis   . Anemia   . Blood transfusion 2004    "when I had my transplant"  . Migraine   . Hyperparathyroidism, secondary   . Diabetic retinopathy   . Hypertension     takes Metoprolol and Exforge daily  . Depression     takes Klonopin nightly  . Diabetes mellitus type 1     Pt states diagnosed at age 76 with prior episodes of DKA. S/p pancreatic transplant   . GERD (gastroesophageal reflux disease)     takes Protonix daily  . Bronchitis   . Migraine   . Asthma   . Emphysema of lung   . Angina     none in past 2 years.  . Stomach pain     Hx: of chronic  . Pneumonia 2012    "double"  . Dialysis patient     tues,thurs,sat  . 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  Surgery was done at [facility].         Past Surgical History  Procedure Laterality Date  . 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 Thyroglobulin      x 7  . Av fistula placement      right upper arm  . Cholecystectomy  1995  .  hd  graft placement/removal      "had 2 in my left upper arm"  . Eye surgery    . Retinopathy surgery    . Tooth extraction  10/10/11    X's two  . Insertion of dialysis catheter  12/08/2011    Procedure: INSERTION OF DIALYSIS CATHETER;  Surgeon: Angelia Mould, MD;  Location: [place];  Service: Vascular;  Laterality: Left;  . Av fistula placement  01/22/2012    Procedure: INSERTION OF ARTERIOVENOUS (AV) GORE-TEX GRAFT ARM;  Surgeon: Angelia Mould, MD;  Location: [place];  Service: Vascular;  Laterality: Left;  Ultrasound guided.  . Av fistula placement  03/14/2012    Procedure: INSERTION OF ARTERIOVENOUS (AV) GORE-TEX GRAFT  THIGH;  Surgeon: Angelia Mould, MD;  Location: [place];  Service: Vascular;  Laterality: Right;  . False aneurysm repair Right 01/02/2013    Procedure: Excision of lymphocele in right thigh.;  Surgeon: Angelia Mould, MD;  Location: [place];  Service: Vascular;  Laterality: Right;  . Carpal tunnel release Right 03/02/2013    Procedure: CARPAL TUNNEL RELEASE;  Surgeon: Tennis Must, MD;  Location: [place];  Service: Orthopedics;  Laterality: Right;  . Flexible sigmoidoscopy N/A 03/24/2013    Procedure: FLEXIBLE SIGMOIDOSCOPY;  Surgeon: Cleotis Nipper, MD;  Location: [facility] ENDOSCOPY;  Service: Endoscopy;  Laterality: N/A;  . Revision of arteriovenous goretex graft Right 04/02/2013    Procedure: REPAIR OF PSEUDOANEURYSM OF ARTERIOVENOUS GORETEX GRAFT;  Surgeon: Angelia Mould, MD;  Location: [place];  Service: Vascular;  Laterality: Right;  . Carpal tunnel release Left 10/16/2013    Procedure: LEFT CARPAL TUNNEL RELEASE;  Surgeon: Tennis Must, MD;  Location: [place];  Service: Orthopedics;  Laterality: Left;  . Unilateral upper extremeity angiogram N/A 11/12/2011    Procedure: UNILATERAL UPPER Anselmo Rod;  Surgeon: Angelia Mould, MD;  Location: [facility] CATH LAB;  Service: Cardiovascular;  Laterality: N/A;  .  Fistulogram Left 03/03/2012    Procedure: FISTULOGRAM;  Surgeon: Angelia Mould, MD;  Location: [facility] CATH LAB;  Service: Cardiovascular;  Laterality: Left;    in for   Chief Complaint  Patient presents with  . Hyperglycemia  . Cough  . Generalized Body Aches     HPI This is a 44 year old female with known history of diabetes on insulin with recurrent admissions for DKA, retention, dyslipidemia, 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 Patient was recently discharged 6/21 after an admission for DKA. Chest has underlying diabetic gastroparesis and underwent a barium swallow last admission and this revealed mild esophageal dysmotility. She presents today complaints of elevated blood glucose, cough and generalized body aches. She reports beginning of symptoms about 3 days ago with generalized malaise, fever and chills, yellow productive cough with audible wheezing; and emesis after hemodialysis. She reports that her glucometer machine has been broken for the past 24 hours and she has been unable to check her CBGs although she reports she took her usual doses of insulin. She reports that she did attend hemodialysis yesterday and completed a full treatment without incident other than the emesis.  In the ER patient had low-grade temperature of 99 1, respiratory rate was 29, BP was 169/72, was rate was 94. Oxygen saturations were between 92 and 95%. On room air. Her initial CBG was greater than 600 with a CO2 of 11 and an anion gap of 28 and her serum glucose was 1000. Her lactic acid was elevated at 3.35. She did not have a white count. Hemoglobin was slightly elevated for her at 10.8. BUN was 38 and creatinine 8.96. She was volume resuscitated with a total of 3 L of IV fluid and started on insulin drip. She  is a very difficult IV stick and since arrival to the ER she has had 2 peripheral IV lines placed via ultrasound by the emergency room physician. Initial chest x-ray revealed no focal infiltrates but did revealed mild pulmonary interstitial edema that had improved since previous chest x-ray on 11/10/2014. EKG revealed sinus rhythm with a PR  interval of 0.24, QTC 518 ms otherwise no acute ischemic changes. Her potassium was 6.5 and sodium was 122. Because she was recently hospitalized blood cultures were obtained given her elevated lactic acid but otherwise she was hemodynamically stable did not have other clinical signs consistent with sepsis physiology.   Review of Systems   In addition to the HPI above,  No Headache, changes with Vision or hearing, new weakness, tingling, numbness in any extremity, No problems swallowing food or Liquids, indigestion/reflux No Chest pain,, palpitations, orthopnea or DOE No Abdominal pain; no melena or hematochezia, no dark tarry stools, Bowel movements are regular, No dysuria, hematuria or flank pain No new skin rashes, lesions, masses or bruises, No new joints pains-aches No recent weight gain or loss No polyuria, polydypsia or polyphagia,  *A full 10 point Review of Systems was done, except as stated above, all other Review of Systems were negative.  Social History History  Substance Use Topics  . Smoking status: Never Smoker   . Smokeless tobacco: Never Used  . Alcohol Use: No    Resides at: Private residence  Lives with: Alone  Ambulatory status: Without assistive devices   Family History Family History  Problem Relation Age of Onset  . Hypertension Mother   . Kidney disease Mother   . Diabetes Mother   . Hypertension Father   . Kidney disease Father   . Diabetes Father      Prior to Admission medications   Medication Sig Start Date End Date Taking? Authorizing Provider  albuterol (PROVENTIL HFA;VENTOLIN HFA) 108 (90 BASE) MCG/ACT  inhaler Inhale 2 puffs into the lungs every 4 (four) hours as needed for wheezing or shortness of breath (as per home regimen). 02/20/13   Kinnie Feil, MD  aspirin 325 MG tablet Take 1 tablet (325 mg total) by mouth daily. Patient not taking: Reported on 11/10/2014 05/20/14   Velvet Bathe, MD  calcium acetate (PHOSLO) 667 MG capsule Take 1 capsule (667 mg total) by mouth 2 (two) times daily. 11/16/14   Theodis Blaze, MD  clonazePAM (KLONOPIN) 0.5 MG tablet Take 0.5 mg by mouth 2 (two) times daily.     Historical Provider, MD  Darbepoetin Alfa (ARANESP) 40 MCG/0.4ML SOSY injection Inject 0.4 mLs (40 mcg total) into the vein every Thursday with hemodialysis. 08/05/14   Geradine Girt, DO  doxercalciferol (HECTOROL) 4 MCG/2ML injection Inject 1 mL (2 mcg total) into the vein Every Tuesday,Thursday,and Saturday with dialysis. 08/05/14   Geradine Girt, DO  gabapentin (NEURONTIN) 100 MG capsule Take 100 mg by mouth 2 (two) times daily.     Historical Provider, MD  insulin aspart (NOVOLOG) 100 UNIT/ML injection Inject 7 Units into the skin 3 (three) times daily with meals. 08/05/14   Geradine Girt, DO  insulin glargine (LANTUS) 100 UNIT/ML injection Inject 0.07 mLs (7 Units total) into the skin 2 (two) times daily. 08/05/14   Geradine Girt, DO  lidocaine (LIDODERM) 5 % Place 1 patch onto the skin daily. Remove & Discard patch within 12 hours or as directed by MD 05/04/14   Liam Graham, PA-C  metoCLOPramide (REGLAN) 5 MG tablet Take 1 tablet (5 mg total) by mouth 4 (four) times daily -  before meals and at bedtime. 08/05/14   Geradine Girt, DO  metoprolol succinate (TOPROL-XL) 100 MG 24 hr tablet Take 1 tablet (100 mg total) by mouth daily. Take with or immediately following a meal. 05/20/14   Velvet Bathe, MD  montelukast (SINGULAIR) 10 MG tablet Take 10 mg by mouth at bedtime.    Historical Provider, MD  multivitamin (RENA-VIT) TABS tablet Take 1 tablet by mouth daily. 08/05/14   Geradine Girt, DO    Nutritional Supplements (FEEDING SUPPLEMENT, NEPRO CARB STEADY,) LIQD Take 237 mLs by mouth 2 (two) times daily between meals. 04/23/14   Orson Eva, MD  pantoprazole (PROTONIX) 40 MG tablet Take 1 tablet (40 mg total) by mouth at bedtime. 08/05/14   Geradine Girt, DO  promethazine (PHENERGAN) 25 MG tablet Take 25 mg by mouth every 6 (six) hours as needed for nausea or vomiting.    Historical Provider, MD  simvastatin (ZOCOR) 20 MG tablet Take 1 tablet (20 mg total) by mouth at bedtime. 05/20/14   Velvet Bathe, MD  zolpidem (AMBIEN) 5 MG tablet Take 5 mg by mouth at bedtime as needed. sleep 02/09/14   Historical Provider, MD    Allergies  Allergen Reactions  . Depakote [Divalproex Sodium] Other (See Comments)    Pt gets "delirious"  . Morphine And Related Nausea And Vomiting and Other (See Comments)    "makes me delirious"  . Penicillins Anaphylaxis    Tolerated Zosyn Dec 2014  . Tramadol Nausea And Vomiting  . Imitrex [Sumatriptan] Other (See Comments)    seizures  . Vicodin [Hydrocodone-Acetaminophen] Itching and Rash    Physical Exam  Vitals  Blood pressure 147/57, pulse 97, temperature 99.1 F (37.3 C), temperature source Oral, resp. rate 32, height 5\' 1"  (1.549 m), weight 150 lb 12.7 oz (68.4 kg), last menstrual period 11/10/2014, SpO2 92 %.   General:  In no acute distress, appears likely ill  Psych:  Normal affect, Denies Suicidal or Homicidal ideations, Awake Alert, Oriented X 3. Speech and thought patterns are clear and appropriate, no apparent short term memory deficits  Neuro:   No focal neurological deficits, CN II through XII intact, Strength 5/5 all 4 extremities, Sensation intact all 4 extremities.  ENT:  Ears and Eyes appear Normal, Conjunctivae clear, PER. Moist oral mucosa without erythema or exudates.  Neck:  Supple, No lymphadenopathy appreciated  Respiratory:  Symmetrical chest wall movement, Good air movement bilaterally, bilateral expiratory wheezes, Room  Air  Cardiac:  RRR, No Murmurs, no LE edema noted, no JVD, No carotid bruits, peripheral pulses palpable at 2+  Abdomen:  Positive bowel sounds, Soft, Non tender, Non distended,  No masses appreciated, no obvious hepatosplenomegaly  Skin:  No Cyanosis, Normal Skin Turgor, No Skin Rash or Bruise.  Extremities: Symmetrical without obvious trauma or injury,  no effusions.  Data Review  CBC  Recent Labs Lab 12/01/14 1017  WBC 8.9  HGB 10.8*  HCT 35.3*  PLT 108*  MCV 103.5*  MCH 31.7  MCHC 30.6  RDW 15.7*  LYMPHSABS 1.0  MONOABS 0.4  EOSABS 0.0  BASOSABS 0.0    Chemistries   Recent Labs Lab 12/01/14 1017  NA 122*  K 6.5*  CL 83*  CO2 11*  GLUCOSE 1000*  BUN 38*  CREATININE 8.96*  CALCIUM 7.8*  AST 64*  ALT QUANTITY NOT SUFFICIENT, UNABLE TO PERFORM TEST  ALKPHOS 134*  BILITOT QUANTITY NOT SUFFICIENT, UNABLE TO PERFORM TEST    estimated creatinine clearance is 7.2 mL/min (by C-G formula based on Cr of 8.96).  No results for input(s): TSH, T4TOTAL, T3FREE, THYROIDAB in the last 72 hours.  Invalid input(s): FREET3  Coagulation profile No results for input(s): INR, PROTIME in the last 168 hours.  No results  for input(s): DDIMER in the last 72 hours.  Cardiac Enzymes No results for input(s): CKMB, TROPONINI, MYOGLOBIN in the last 168 hours.  Invalid input(s): CK  Invalid input(s): POCBNP  Urinalysis    Component Value Date/Time   COLORURINE YELLOW 09/07/2010 1517   APPEARANCEUR TURBID* 09/07/2010 1517   LABSPEC 1.016 09/07/2010 1517   PHURINE 7.0 09/07/2010 1517   GLUCOSEU >1000* 09/07/2010 1517   HGBUR LARGE* 09/07/2010 1517   [place] 09/07/2010 1517   [place] 09/07/2010 1517   PROTEINUR >300* 09/07/2010 1517   UROBILINOGEN 0.2 09/07/2010 1517   NITRITE NEGATIVE 09/07/2010 1517   LEUKOCYTESUR LARGE* 09/07/2010 1517    Imaging results:   Dg Chest 2 View  12/01/2014   CLINICAL DATA:  Cough fever congestion and body  aches associated with nausea vomiting and diarrhea for the past 2-3 days; history of COPD -asthma, diabetes, and kidney and pancreatic transplant  EXAM: CHEST  2 VIEW  COMPARISON:  Portable chest x-ray of November 10, 2014  FINDINGS: Top-normal in size. The pulmonary vascularity is less prominent today but remains mildly engorged. The mediastinum is normal in width. The bony thorax is unremarkable.  IMPRESSION: Mild pulmonary interstitial edema, improved since the previous study. There is no alveolar pneumonia.   Electronically Signed   By: David  Martinique M.D.   On: 12/01/2014 11:08   Dg Esophagus  11/15/2014   CLINICAL DATA:  Chest pain when swallowing.  Evaluate esophagus.  EXAM: ESOPHOGRAM/BARIUM SWALLOW  TECHNIQUE: Single contrast examination was performed using  thin barium.  FLUOROSCOPY TIME:  If the device does not provide the exposure index:  Fluoroscopy Time:  2 minutes and 24 seconds  Number of Acquired Images:  None  COMPARISON:  Chest radiograph of 11/10/2014.  FINDINGS: Focused exam was performed using single contrast technique and RAO position.  Evaluation of primary peristalsis demonstrates mild esophageal dysmotility, with contrast stasis in the lower thoracic esophagus.  Full column evaluation of the esophagus demonstrates no persistent narrowing or stricture.  IMPRESSION: 1. No evidence of esophageal stricture. 2. Mild esophageal dysmotility, with contrast stasis in the lower thoracic esophagus.   Electronically Signed   By: Abigail Miyamoto M.D.   On: 11/15/2014 16:22   Dg Chest Port 1 View  11/10/2014   CLINICAL DATA:  Dyspnea  EXAM: PORTABLE CHEST - 1 VIEW  COMPARISON:  08/04/2014  FINDINGS: Chronic cardiomegaly, accentuated by hypoventilation and portable technique. Stable aortic and hilar contours, distorted from technique.  Interstitial and hazy density crowding at the bases without definite pneumonia or edema. No effusion or pneumothorax.  IMPRESSION: 1. Bibasilar opacity is likely atelectasis  given hypoventilation. No edema or definitive pneumonia. 2. Chronic cardiomegaly.   Electronically Signed   By: Monte Fantasia M.D.   On: 11/10/2014 10:06     EKG: (Independently reviewed) sinus rhythm with borderline prolonged PR interval as well as prolonged QTC but no ischemic changes   Assessment & Plan  Principal Problem:    DKA  -Admit to stepdown -Continue DKA protocol to include insulin infusion and bmet checks every 4 hours until anion gap closed -Since a dialysis patient has already received 3 L of IV fluids therefore we'll run remaining IV fluids at [facility] -Check hemoglobin A1c -Patient reports malfunctioning glucometer at home so will need case management assistance versus new prescription at time of discharge -Patient denies nonadherence to diabetic medication regimen; presenting with respiratory symptoms which may have precipitated current DKA  Active Problems:   End-stage renal disease  on hemodialysis/Hyperkalemia -Unsure accuracy of potassium level (i.e. may be hemolyzed) in setting of poor access and difficulty obtaining labs and IV placement -Repeating electrolyte panel stat -Have notified nephrology of patient admission and need for dialysis on 7/7    GERD/Gastroparesis/mild esophageal dysmotility per recent barium study -Continue preadmission Reglan and Protonix    HTN  -Moderately controlled -Continue preadmission Toprol    Acute bronchitis/Asthma -Currently stable and not hypoxemic -Chest x-ray without focal infiltrate so doubt HCAP -Primary symptoms are wheezing and cough -Empiric antibiotics with doxycycline noting no Zithromax or Levaquin due to prolonged QTC-oral dosing since limited IV access -Supportive care with duo nebs and flutter valve -Since does have an edema like pattern on chest x-ray need to rule out viral etiology so we'll start with influenza PCR since also had generalized malaise and emesis    Prolonged QTC -We'll need to repeat EKG prior  to discharge -Avoid offending agents    Hyperlipidemia -Continue Zocor    RLS (restless legs syndrome) -Continue preadmission Klonopin, Neurontin and Ambien    History of simultaneous kidney and pancreas transplant -No longer on immunosuppressive agents    Recent Left pontine CVA -No residual defects    DVT Prophylaxis: Lovenox  Family Communication:  No family at bedside  Code Status:  Full code  Condition: Stable   Discharge disposition: Anticipate discharge home once DKA resolved  Time spent in minutes : 60      ELLIS,ALLISON L. ANP on 12/01/2014 at 2:02 PM  Between 7am to 7pm - Pager - [phone]  After 7pm go to [email or web address] - password [email or web address]  And look for the night coverage person covering me after hours  [facility]  Patient was seen, examined,treatment plan was discussed with the Advance Practice Provider.  I have directly reviewed the clinical findings, lab, imaging studies and management of this patient in detail. I have made the necessary changes to the above noted documentation, and agree with the documentation, as recorded by the Advance Practice Provider.    Marzetta Board, MD [facility] [phone]

## 2014-12-01 NOTE — ED Notes (Signed)
Pt undressed, in gown, on monitor, continuous pulse oximetry and blood pressure cuff 

## 2014-12-01 NOTE — Progress Notes (Signed)
CRITICAL VALUE ALERT  Critical value received:  Lactic Acid, Venous 3.7  Date of notification:  12/01/2014  Time of notification:  7322  Critical value read back:Yes.    Nurse who received alert:  Rico Sheehan RN  MD notified (1st page):  Dr Cruzita Lederer  Time of first page:  1705  MD notified (2nd page):  Time of second page:  Responding MD:  DR Cruzita Lederer  Time MD responded:  Text Md on Amion and gave him a call at [phone]

## 2014-12-01 NOTE — Progress Notes (Signed)
12/01/2014 Patient transfer from Emergency room to 2 central at 1500. She is alert, little sleepy, oriented and ambulatory. Patient arrive to the unit on Gluco stabilizer. She was placed on telemetry. Patient does not have any wound, skin is dry. She does have tattoos on arms, shoulder lower back, heels are dry and on right arm. She was wipe down with CHG,and because she stated she was weak yellow sock and risk band was place on patient. [facility] RN.

## 2014-12-01 NOTE — Progress Notes (Signed)
Spoke with primary RN regarding PICC insertion.   Since pt is a renal pt she would need to be referred for a central line or have IR insert a PICC via IJ route.  IV Team has attempted PICCs on her in past without success plus the fact she is a renal pt.

## 2014-12-01 NOTE — ED Notes (Signed)
Floor made aware pt coming

## 2014-12-01 NOTE — ED Notes (Signed)
Dr. Vanita Panda made aware.

## 2014-12-01 NOTE — ED Notes (Signed)
Lab at the bedside 

## 2014-12-02 ENCOUNTER — Inpatient Hospital Stay (HOSPITAL_COMMUNITY): Payer: Medicare Other

## 2014-12-02 ENCOUNTER — Encounter (HOSPITAL_COMMUNITY): Payer: Self-pay | Admitting: Physician Assistant

## 2014-12-02 DIAGNOSIS — E875 Hyperkalemia: Secondary | ICD-10-CM

## 2014-12-02 DIAGNOSIS — E0810 Diabetes mellitus due to underlying condition with ketoacidosis without coma: Secondary | ICD-10-CM

## 2014-12-02 DIAGNOSIS — T82838A Hemorrhage of vascular prosthetic devices, implants and grafts, initial encounter: Secondary | ICD-10-CM | POA: Insufficient documentation

## 2014-12-02 DIAGNOSIS — I10 Essential (primary) hypertension: Secondary | ICD-10-CM

## 2014-12-02 DIAGNOSIS — J209 Acute bronchitis, unspecified: Secondary | ICD-10-CM

## 2014-12-02 DIAGNOSIS — N186 End stage renal disease: Secondary | ICD-10-CM

## 2014-12-02 DIAGNOSIS — Z992 Dependence on renal dialysis: Secondary | ICD-10-CM

## 2014-12-02 LAB — BASIC METABOLIC PANEL
ANION GAP: 17 — AB (ref 5–15)
ANION GAP: 15 (ref 5–15)
Anion gap: 16 — ABNORMAL HIGH (ref 5–15)
BUN: 47 mg/dL — ABNORMAL HIGH (ref 6–20)
BUN: 47 mg/dL — ABNORMAL HIGH (ref 6–20)
BUN: 51 mg/dL — ABNORMAL HIGH (ref 6–20)
CALCIUM: 7.9 mg/dL — AB (ref 8.9–10.3)
CALCIUM: 7.7 mg/dL — AB (ref 8.9–10.3)
CHLORIDE: 92 mmol/L — AB (ref 101–111)
CO2: 23 mmol/L (ref 22–32)
CO2: 24 mmol/L (ref 22–32)
CO2: 24 mmol/L (ref 22–32)
Calcium: 7.6 mg/dL — ABNORMAL LOW (ref 8.9–10.3)
Chloride: 93 mmol/L — ABNORMAL LOW (ref 101–111)
Chloride: 94 mmol/L — ABNORMAL LOW (ref 101–111)
Creatinine, Ser: 10.48 mg/dL — ABNORMAL HIGH (ref 0.44–1.00)
Creatinine, Ser: 10.58 mg/dL — ABNORMAL HIGH (ref 0.44–1.00)
Creatinine, Ser: 11.28 mg/dL — ABNORMAL HIGH (ref 0.44–1.00)
GFR calc Af Amer: 5 mL/min — ABNORMAL LOW (ref >60)
GFR calc Af Amer: 5 mL/min — ABNORMAL LOW (ref >60)
GFR calc Af Amer: 4 mL/min — ABNORMAL LOW (ref >60)
GFR calc non Af Amer: 4 mL/min — ABNORMAL LOW (ref >60)
GFR calc non Af Amer: 4 mL/min — ABNORMAL LOW (ref >60)
GFR calc non Af Amer: 4 mL/min — ABNORMAL LOW (ref >60)
GLUCOSE: 123 mg/dL — AB (ref 65–99)
GLUCOSE: 140 mg/dL — AB (ref 65–99)
Glucose, Bld: 212 mg/dL — ABNORMAL HIGH (ref 65–99)
POTASSIUM: 3.4 mmol/L — AB (ref 3.5–5.1)
Potassium: 3.6 mmol/L (ref 3.5–5.1)
Potassium: 3.9 mmol/L (ref 3.5–5.1)
SODIUM: 133 mmol/L — AB (ref 135–145)
Sodium: 133 mmol/L — ABNORMAL LOW (ref 135–145)
Sodium: 132 mmol/L — ABNORMAL LOW (ref 135–145)

## 2014-12-02 LAB — GLUCOSE, CAPILLARY
GLUCOSE-CAPILLARY: 109 mg/dL — AB (ref 65–99)
GLUCOSE-CAPILLARY: 118 mg/dL — AB (ref 65–99)
GLUCOSE-CAPILLARY: 121 mg/dL — AB (ref 65–99)
GLUCOSE-CAPILLARY: 140 mg/dL — AB (ref 65–99)
GLUCOSE-CAPILLARY: 150 mg/dL — AB (ref 65–99)
GLUCOSE-CAPILLARY: 151 mg/dL — AB (ref 65–99)
GLUCOSE-CAPILLARY: 165 mg/dL — AB (ref 65–99)
GLUCOSE-CAPILLARY: 194 mg/dL — AB (ref 65–99)
GLUCOSE-CAPILLARY: 209 mg/dL — AB (ref 65–99)
Glucose-Capillary: 145 mg/dL — ABNORMAL HIGH (ref 65–99)
Glucose-Capillary: 114 mg/dL — ABNORMAL HIGH (ref 65–99)
Glucose-Capillary: 119 mg/dL — ABNORMAL HIGH (ref 65–99)
Glucose-Capillary: 160 mg/dL — ABNORMAL HIGH (ref 65–99)
Glucose-Capillary: 248 mg/dL — ABNORMAL HIGH (ref 65–99)
Glucose-Capillary: 170 mg/dL — ABNORMAL HIGH (ref 65–99)
Glucose-Capillary: 116 mg/dL — ABNORMAL HIGH (ref 65–99)
Glucose-Capillary: 275 mg/dL — ABNORMAL HIGH (ref 65–99)
Glucose-Capillary: 176 mg/dL — ABNORMAL HIGH (ref 65–99)

## 2014-12-02 LAB — HEMOGLOBIN A1C
Hgb A1c MFr Bld: 11.8 % — ABNORMAL HIGH (ref 4.8–5.6)
Mean Plasma Glucose: 292 mg/dL

## 2014-12-02 MED ORDER — PROMETHAZINE HCL 25 MG/ML IJ SOLN
25.0000 mg | Freq: Once | INTRAMUSCULAR | Status: AC
  Administered 2014-12-02: 25 mg via INTRAVENOUS

## 2014-12-02 MED ORDER — LIDOCAINE HCL 1 % IJ SOLN
INTRAMUSCULAR | Status: AC
Start: 2014-12-02 — End: 2014-12-02
  Filled 2014-12-02: qty 20

## 2014-12-02 MED ORDER — DARBEPOETIN ALFA 40 MCG/0.4ML IJ SOSY
PREFILLED_SYRINGE | INTRAMUSCULAR | Status: AC
  Filled 2014-12-02: qty 0.4

## 2014-12-02 MED ORDER — BENZONATATE 100 MG PO CAPS
100.0000 mg | ORAL_CAPSULE | Freq: Three times a day (TID) | ORAL | Status: DC
  Administered 2014-12-02 – 2014-12-06 (×14): 100 mg via ORAL
  Filled 2014-12-02 (×17): qty 1

## 2014-12-02 MED ORDER — MIDAZOLAM HCL 2 MG/2ML IJ SOLN
INTRAMUSCULAR | Status: AC | PRN
  Administered 2014-12-02 (×2): 1 mg via INTRAVENOUS

## 2014-12-02 MED ORDER — MIDAZOLAM HCL 2 MG/2ML IJ SOLN
INTRAMUSCULAR | Status: AC
  Filled 2014-12-02: qty 2

## 2014-12-02 MED ORDER — IOHEXOL 300 MG/ML  SOLN
100.0000 mL | Freq: Once | INTRAMUSCULAR | Status: AC | PRN
  Administered 2014-12-02: 70 mL via INTRAVENOUS

## 2014-12-02 MED ORDER — OXYCODONE HCL 5 MG PO TABS
ORAL_TABLET | ORAL | Status: AC
  Filled 2014-12-02: qty 1

## 2014-12-02 MED ORDER — DOXERCALCIFEROL 4 MCG/2ML IV SOLN
INTRAVENOUS | Status: AC
  Filled 2014-12-02: qty 2

## 2014-12-02 MED ORDER — PROMETHAZINE HCL 25 MG/ML IJ SOLN
INTRAMUSCULAR | Status: AC
  Filled 2014-12-02: qty 1

## 2014-12-02 MED ORDER — OXYCODONE HCL 5 MG PO TABS
5.0000 mg | ORAL_TABLET | Freq: Four times a day (QID) | ORAL | Status: DC | PRN
  Administered 2014-12-02 (×2): 5 mg via ORAL
  Filled 2014-12-02: qty 1

## 2014-12-02 MED ORDER — IPRATROPIUM-ALBUTEROL 0.5-2.5 (3) MG/3ML IN SOLN
3.0000 mL | Freq: Three times a day (TID) | RESPIRATORY_TRACT | Status: DC
  Administered 2014-12-02 – 2014-12-03 (×4): 3 mL via RESPIRATORY_TRACT
  Filled 2014-12-02 (×5): qty 3

## 2014-12-02 MED ORDER — IBUPROFEN 400 MG PO TABS
400.0000 mg | ORAL_TABLET | Freq: Once | ORAL | Status: AC
  Administered 2014-12-02: 400 mg via ORAL
  Filled 2014-12-02: qty 1
  Filled 2014-12-02: qty 2

## 2014-12-02 MED ORDER — FENTANYL CITRATE (PF) 100 MCG/2ML IJ SOLN
INTRAMUSCULAR | Status: AC
  Filled 2014-12-02: qty 2

## 2014-12-02 MED ORDER — FENTANYL CITRATE (PF) 100 MCG/2ML IJ SOLN
INTRAMUSCULAR | Status: AC | PRN
  Administered 2014-12-02: 50 ug via INTRAVENOUS
  Administered 2014-12-02: 25 ug via INTRAVENOUS

## 2014-12-02 NOTE — Progress Notes (Addendum)
[name] Rounding Note  Subjective:  Feeling much better Says "my endocrinologist rescheduled my appt and my meter broke" Says takes her insulin but factually does not always take on mornings of dialysis and does not bring her insulin to dialysis with her when her readings there are "high" Multiple admits for DKA Still coughing some  Objective Vital signs in last 24 hours: Filed Vitals:   12/02/14 0015 12/02/14 0315 12/02/14 0802 12/02/14 0803  BP: 91/49 109/56    Pulse: 69 67  69  Temp: 98.4 F (36.9 C) 98 F (36.7 C)    TempSrc: Axillary Axillary    Resp:  20  17  Height:      Weight:      SpO2: 96% 98% 100% 100%   Weight change:   Intake/Output Summary (Last 24 hours) at 12/02/14 0836 Last data filed at 12/02/14 0600  Gross per 24 hour  Intake  78.09 ml  Output      0 ml  Net  78.09 ml     Physical Exam:  BP 109/56 mmHg  Pulse 69  Temp(Src) 98 F (36.7 C) (Axillary)  Resp 17  Ht 5\' 1"  (1.549 m)  Wt 68.4 kg (150 lb 12.7 oz)  BMI 28.51 kg/m2  SpO2 100%  LMP 11/10/2014  Weight 68.4 g (EDW 68) Awake, alert, NAD Right sided central line in place (IJ power picc) Lungs grossly clear Regular rhythm S1S2 No S3  Abd soft. 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 No abd tenderness No edema of LLE mild edema RLE Right thigh AVG + bruit   Labs: Basic Metabolic Panel:  Recent Labs Lab 12/01/14 1017 12/01/14 1541 12/01/14 1900 12/02/14 0045 12/02/14 0345  NA 122* 124* 128* 133* 132*  K 6.5* 5.1 3.6 3.6 3.4*  CL 83* 86* 89* 92* 93*  CO2 11* 14* 24 24 23   GLUCOSE 1000* 910* 460* 123* 140*  BUN 38* 43* 43* 47* 47*  CREATININE 8.96* 9.65* 9.78* 10.48* 10.58*  CALCIUM 7.8* 7.5* 7.7* 7.9* 7.6*   Liver Function Tests:  Recent Labs Lab 12/01/14 1017  AST 64*  ALT QUANTITY NOT SUFFICIENT, UNABLE TO PERFORM TEST  ALKPHOS 134*  BILITOT QUANTITY NOT SUFFICIENT, UNABLE TO PERFORM TEST  PROT 7.6  ALBUMIN 3.5   CBC:  Recent Labs Lab  12/01/14 1017  WBC 8.9  NEUTROABS 7.5  HGB 10.8*  HCT 35.3*  MCV 103.5*  PLT 108*     Recent Labs Lab 12/02/14 0257 12/02/14 0400 12/02/14 0505 12/02/14 0614 12/02/14 0748  GLUCAP 145* 114* 119* 160* 248*   Results for [name], [name] (MRN [id number]) as of 12/02/2014 08:35  Ref. Range 12/01/2014 15:23  Influenza A By PCR Latest Ref Range: NEGATIVE  NEGATIVE  Influenza B By PCR Latest Ref Range: NEGATIVE  NEGATIVE  H1N1 flu by pcr Latest Ref Range: NOT DETECTED  NOT DETECTED   Studies/Results: Dg Chest 2 View  12/01/2014   CLINICAL DATA:  Cough fever congestion and body aches associated with nausea vomiting and diarrhea for the past 2-3 days; history of COPD -asthma, diabetes, and [name] and pancreatic transplant  EXAM: CHEST  2 VIEW  COMPARISON:  Portable chest x-ray of November 10, 2014  FINDINGS: Top-normal in size. The pulmonary vascularity is less prominent today but remains mildly engorged. The mediastinum is normal in width. The bony thorax is unremarkable.  IMPRESSION: Mild pulmonary interstitial edema, improved since the previous study. There is no alveolar pneumonia.   Electronically Signed  By: David  Martinique M.D.   On: 12/01/2014 11:08   Ir Fluoro Guide Cv Line Right  12/01/2014   CLINICAL DATA:  DIABETIC KETOACIDOSIS, access for insulin drip appear  EXAM: RIGHT INTERNAL JUGULAR DOUBLE-LUMEN NON TUNNELED POWER PICC LINE PLACEMENT WITH ULTRASOUND AND FLUOROSCOPIC GUIDANCE  FLUOROSCOPY TIME:  30 SECONDS  PROCEDURE: The patient was advised of the possible risks andcomplications and agreed to undergo the procedure. The patient was then brought to the angiographic suite for the procedure.  The right neckwas prepped with chlorhexidine, drapedin the usual sterile fashion using maximum barrier technique (cap and mask, sterile gown, sterile gloves, large sterile sheet, hand hygiene and cutaneous antisepsis) and infiltrated locally with 1% Lidocaine.  Ultrasound demonstrated patency of the  right internal vein, and this was documented with an image. Under real-time ultrasound guidance, this vein was accessed with a 21 gauge micropuncture needle and image documentation was performed. A 0.018 wire was introduced in to the vein. Over this, a 5 [place] double lumen non tunneled power PICC was advanced to the lower SVC/right atrial junction. Fluoroscopy during the procedure and fluoro spot radiograph confirms appropriate catheter position. The catheter was flushed and covered with asterile dressing.  Catheter length: 16  Complications: None immediate  IMPRESSION: Successful right internal jugular double-lumen non tunneled power PICC line placement with ultrasound and fluoroscopic guidance. The catheter is ready for use.   Electronically Signed   By: Jerilynn Mages.  Shick M.D.   On: 12/01/2014 17:43   Ir US Guide Vasc Access Right  12/01/2014   CLINICAL DATA:  DIABETIC KETOACIDOSIS, access for insulin drip appear  EXAM: RIGHT INTERNAL JUGULAR DOUBLE-LUMEN NON TUNNELED POWER PICC LINE PLACEMENT WITH ULTRASOUND AND FLUOROSCOPIC GUIDANCE  FLUOROSCOPY TIME:  30 SECONDS  PROCEDURE: The patient was advised of the possible risks andcomplications and agreed to undergo the procedure. The patient was then brought to the angiographic suite for the procedure.  The right neckwas prepped with chlorhexidine, drapedin the usual sterile fashion using maximum barrier technique (cap and mask, sterile gown, sterile gloves, large sterile sheet, hand hygiene and cutaneous antisepsis) and infiltrated locally with 1% Lidocaine.  Ultrasound demonstrated patency of the right internal vein, and this was documented with an image. Under real-time ultrasound guidance, this vein was accessed with a 21 gauge micropuncture needle and image documentation was performed. A 0.018 wire was introduced in to the vein. Over this, a 5 [place] double lumen non tunneled power PICC was advanced to the lower SVC/right atrial junction. Fluoroscopy during the  procedure and fluoro spot radiograph confirms appropriate catheter position. The catheter was flushed and covered with asterile dressing.  Catheter length: 16  Complications: None immediate  IMPRESSION: Successful right internal jugular double-lumen non tunneled power PICC line placement with ultrasound and fluoroscopic guidance. The catheter is ready for use.   Electronically Signed   By: Jerilynn Mages.  Shick M.D.   On: 12/01/2014 17:43   Medications: . sodium chloride    . sodium chloride 1,000 mL (12/01/14 1401)  . dextrose 5 % and 0.45% NaCl 10 mL/hr at 12/01/14 2107  . insulin (NOVOLIN-R) infusion 1.9 Units/hr (12/02/14 0749)   . sodium chloride   Intravenous STAT  . aspirin  325 mg Oral Daily  . benzonatate  100 mg Oral TID  . calcium acetate  667 mg Oral BID WC  . clonazePAM  0.5 mg Oral BID  . Darbepoetin Alfa  40 mcg Intravenous Q Thu-HD  . doxercalciferol  3 mcg Intravenous Q T,Th,Sa-HD  .  doxycycline  100 mg Oral Q12H  . enoxaparin (LOVENOX) injection  30 mg Subcutaneous Q24H  . gabapentin  100 mg Oral BID  . ipratropium-albuterol  3 mL Nebulization TID  . metoCLOPramide  5 mg Oral TID AC & HS  . metoprolol succinate  100 mg Oral Daily  . montelukast  10 mg Oral QHS  . multivitamin  1 tablet Oral QHS  . pantoprazole  40 mg Oral QHS  . simvastatin  20 mg Oral QHS    Dialysis Orders: TTS NW 3hr 45 mins 68kgs 2K/2.5ca R thigh AVG No heparin Hectorol 3 Micera 150 q 2 weeks- last dose given 6/30  Assessment/Plan: 1. DKA- acidosis and hyperkalemia have resolved with correction of glucose that was 1000 to start. Pt without a functional glucometer or a local endocrinologist. Continued mgmt per admitting team.                   2. Acute bronchitis- doxycycline started/ influenza panel negative and blood cultures still pending 3. ESRD - TTS NW- HD today 4K bath. Needs only minimal volume off 4. Hypertension/volume -home norvasc and toprol. Currently just toprol. Just over EDW   5. Anemia - hgb 10.8- last dose of micera given 6/30- due next week. No Fe- tsat 37 6. Metabolic bone disease - last phos 5.6 and PTH 217/ cont hectorol and phoslo.  7. Nutrition - NPO/ vitamin 8. Bleeding AVG post HD- arrange shuntogram while admitted with IR once sugars more stable (had been scheduled with [facility] but missed her appt d/t hosp)See addendum 9. Hyperkalemia - due to glucose shifts. Has resolved and is now low side simply with correction of blood glucose 10. GERD/esophageal dysmotility - reglan/protonix 11. 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

## 2014-12-02 NOTE — Progress Notes (Signed)
Inpatient Diabetes Program Recommendations  AACE/ADA: New Consensus Statement on Inpatient Glycemic Control (2013)  Target Ranges:  Prepandial:   less than 140 mg/dL      Peak postprandial:   less than 180 mg/dL (1-2 hours)      Critically ill patients:  140 - 180 mg/dL   Spoke briefly with patient regarding DKA admission.  She states that she does not know cause of DKA event and that her diabetes is "brittle".    Diabetes history: Type 1 Outpatient Diabetes medications: Lantus 7 units bid, Novolog 6 units bid with breakfast and supper Current orders for Inpatient glycemic control:  Insulin drip/DKA order set  RN states she is in the process of calling MD regarding transition orders.  CO2=24 at 11:02 am.  Patient again informed RN of home insulin orders.  Note that patient also for HD today.  Thanks, Adah Perl, RN, BC-ADM Inpatient Diabetes Coordinator Pager [phone] (8a-5p)

## 2014-12-02 NOTE — H&P (Signed)
Chief Complaint: Chief Complaint  Patient presents with  . Hyperglycemia  . Cough  . Generalized Body Aches    Referring Physician(s): Dr. Jamal Maes  History of Present Illness: [name] is a 44 y.o. female with ESRD on dialysis who reports prolonged bleeding from her right leg AV graft after dialysis.  She states "it bled for almost 30 minutes the other day.. It really scares me!"  Her dialysis days are T-Th-Sa  She is a diabetic and has had multiple admissions for DKA.  She is currently admitted for fever and cough. CXR was negative.  We are asked to perform a shuntogram with possible angioplasty.    Past Medical History  Diagnosis Date  . Hyperlipidemia   . Alopecia   . Obesity   . Iron deficiency   . ESRD (end stage renal disease)     S/p pancreatic and kidney transplant, but back on HD 2008, dialysis at [facility], [place], Sat  . RLS (restless legs syndrome)   . Injury of conjunctiva and corneal abrasion of right eye without foreign body   . Cellulitis   . Anemia   . Blood transfusion 2004    "when I had my transplant"  . Migraine   . Hyperparathyroidism, secondary   . Diabetic retinopathy   . Hypertension     takes Metoprolol and Exforge daily  . Depression     takes Klonopin nightly  . Diabetes mellitus type 1     Pt states diagnosed at age 81 with prior episodes of DKA. S/p pancreatic transplant   . GERD (gastroesophageal reflux disease)     takes Protonix daily  . Bronchitis   . Migraine   . Asthma   . Emphysema of lung   . Angina     none in past 2 years.  . Stomach pain     Hx: of chronic  . Pneumonia 2012    "double"  . Dialysis patient     tues,thurs,sat  . 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  Surgery was  done at [facility].     . DKA (diabetic ketoacidoses) 12/01/2014    Past Surgical History  Procedure Laterality Date  . 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 Thyroglobulin      x 7  . Av fistula placement      right upper arm  . Cholecystectomy  1995  .  hd graft placement/removal      "had 2 in my left upper arm"  . Eye surgery    . Retinopathy surgery    . Tooth extraction  10/10/11    X's two  . Insertion of dialysis catheter  12/08/2011    Procedure: INSERTION OF DIALYSIS CATHETER;  Surgeon: Angelia Mould, MD;  Location: [place];  Service: Vascular;  Laterality: Left;  . Av fistula placement  01/22/2012    Procedure: INSERTION OF ARTERIOVENOUS (AV) GORE-TEX GRAFT ARM;  Surgeon: Angelia Mould, MD;  Location: [place];  Service: Vascular;  Laterality: Left;  Ultrasound guided.  . Av fistula placement  03/14/2012    Procedure: INSERTION OF ARTERIOVENOUS (AV) GORE-TEX GRAFT THIGH;  Surgeon: Angelia Mould, MD;  Location: [place];  Service: Vascular;  Laterality:  Right;  . False aneurysm repair Right 01/02/2013    Procedure: Excision of lymphocele in right thigh.;  Surgeon: Angelia Mould, MD;  Location: [place];  Service: Vascular;  Laterality: Right;  . Carpal tunnel release Right 03/02/2013    Procedure: CARPAL TUNNEL RELEASE;  Surgeon: Tennis Must, MD;  Location: [place];  Service: Orthopedics;  Laterality: Right;  . Flexible sigmoidoscopy N/A 03/24/2013    Procedure: FLEXIBLE SIGMOIDOSCOPY;  Surgeon: Cleotis Nipper, MD;  Location: [facility] ENDOSCOPY;  Service: Endoscopy;  Laterality: N/A;  . Revision of arteriovenous goretex graft Right 04/02/2013    Procedure: REPAIR OF PSEUDOANEURYSM OF ARTERIOVENOUS GORETEX GRAFT;  Surgeon: Angelia Mould, MD;  Location: [place];  Service: Vascular;  Laterality: Right;  . Carpal tunnel release Left 10/16/2013    Procedure: LEFT CARPAL TUNNEL RELEASE;  Surgeon: Tennis Must,  MD;  Location: [place];  Service: Orthopedics;  Laterality: Left;  . Unilateral upper extremeity angiogram N/A 11/12/2011    Procedure: UNILATERAL UPPER Anselmo Rod;  Surgeon: Angelia Mould, MD;  Location: [facility] CATH LAB;  Service: Cardiovascular;  Laterality: N/A;  . Fistulogram Left 03/03/2012    Procedure: FISTULOGRAM;  Surgeon: Angelia Mould, MD;  Location: [facility] CATH LAB;  Service: Cardiovascular;  Laterality: Left;    Allergies: Depakote; Morphine and related; Penicillins; Tramadol; Imitrex; and Vicodin  Medications: Prior to Admission medications   Medication Sig Start Date End Date Taking? Authorizing Provider  albuterol (PROVENTIL HFA;VENTOLIN HFA) 108 (90 BASE) MCG/ACT inhaler Inhale 2 puffs into the lungs every 4 (four) hours as needed for wheezing or shortness of breath (as per home regimen). 02/20/13  Yes Kinnie Feil, MD  aspirin 325 MG tablet Take 1 tablet (325 mg total) by mouth daily. 05/20/14  Yes Velvet Bathe, MD  clonazePAM (KLONOPIN) 0.5 MG tablet Take 0.5 mg by mouth 2 (two) times daily.    Yes Historical Provider, MD  gabapentin (NEURONTIN) 100 MG capsule Take 100 mg by mouth 2 (two) times daily.    Yes Historical Provider, MD  insulin aspart (NOVOLOG) 100 UNIT/ML injection Inject 7 Units into the skin 3 (three) times daily with meals. Patient taking differently: Inject 6 Units into the skin 3 (three) times daily with meals.  08/05/14  Yes Jessica U Vann, DO  insulin glargine (LANTUS) 100 UNIT/ML injection Inject 0.07 mLs (7 Units total) into the skin 2 (two) times daily. 08/05/14  Yes Geradine Girt, DO  metoprolol succinate (TOPROL-XL) 100 MG 24 hr tablet Take 1 tablet (100 mg total) by mouth daily. Take with or immediately following a meal. 05/20/14  Yes Velvet Bathe, MD  montelukast (SINGULAIR) 10 MG tablet Take 10 mg by mouth at bedtime.   Yes Historical Provider, MD  multivitamin (RENA-VIT) TABS tablet Take 1 tablet by mouth  daily. 08/05/14  Yes Geradine Girt, DO  calcium acetate (PHOSLO) 667 MG capsule Take 1 capsule (667 mg total) by mouth 2 (two) times daily. 11/16/14   Theodis Blaze, MD  Darbepoetin Alfa (ARANESP) 40 MCG/0.4ML SOSY injection Inject 0.4 mLs (40 mcg total) into the vein every Thursday with hemodialysis. 08/05/14   Geradine Girt, DO  doxercalciferol (HECTOROL) 4 MCG/2ML injection Inject 1 mL (2 mcg total) into the vein Every Tuesday,Thursday,and Saturday with dialysis. 08/05/14   Geradine Girt, DO  lidocaine (LIDODERM) 5 % Place 1 patch onto the skin daily. Remove & Discard patch within 12 hours or as directed by MD  05/04/14   Liam Graham, PA-C  metoCLOPramide (REGLAN) 5 MG tablet Take 1 tablet (5 mg total) by mouth 4 (four) times daily -  before meals and at bedtime. 08/05/14   Geradine Girt, DO  Nutritional Supplements (FEEDING SUPPLEMENT, NEPRO CARB STEADY,) LIQD Take 237 mLs by mouth 2 (two) times daily between meals. 04/23/14   Orson Eva, MD  pantoprazole (PROTONIX) 40 MG tablet Take 1 tablet (40 mg total) by mouth at bedtime. 08/05/14   Geradine Girt, DO  promethazine (PHENERGAN) 25 MG tablet Take 25 mg by mouth every 6 (six) hours as needed for nausea or vomiting.    Historical Provider, MD  simvastatin (ZOCOR) 20 MG tablet Take 1 tablet (20 mg total) by mouth at bedtime. 05/20/14   Velvet Bathe, MD  zolpidem (AMBIEN) 5 MG tablet Take 5 mg by mouth at bedtime as needed. sleep 02/09/14   Historical Provider, MD     Family History  Problem Relation Age of Onset  . Hypertension Mother   . Kidney disease Mother   . Diabetes Mother   . Hypertension Father   . Kidney disease Father   . Diabetes Father     History   Social History  . Marital Status: Single    Spouse Name: N/A  . Number of Children: N/A  . Years of Education: N/A   Occupational History  . disabled    Social History Main Topics  . Smoking status: Never Smoker   . Smokeless tobacco: Never Used  . Alcohol Use: No  .  Drug Use: No  . Sexual Activity: Yes    Birth Control/ Protection: None   Other Topics Concern  . None   Social History Narrative   Lives at home with fiance, has a 36yo daughter. Ambulatory without cane or walker. Never smoker, no drugs or alcohol. Lost mom last year, has no brothers or sisters.      Review of Systems: A 12 point ROS discussed and pertinent positives are indicated in the HPI above.  All other systems are negative.  Review of Systems  Constitutional: Positive for fever, activity change and fatigue. Negative for chills.  Respiratory: Positive for cough. Negative for chest tightness and shortness of breath.   Cardiovascular: Negative for chest pain.  Gastrointestinal: Negative for nausea, vomiting and abdominal pain.  Musculoskeletal: Negative.   Skin: Negative.   Neurological: Negative.   Psychiatric/Behavioral: Negative.     Vital Signs: BP 109/56 mmHg  Pulse 69  Temp(Src) 98 F (36.7 C) (Axillary)  Resp 17  Ht 5\' 1"  (1.549 m)  Wt 150 lb 12.7 oz (68.4 kg)  BMI 28.51 kg/m2  SpO2 100%  LMP 11/10/2014  Physical Exam  Constitutional: She is oriented to person, place, and time. She appears well-developed and well-nourished.  HENT:  Head: Normocephalic and atraumatic.  Eyes: EOM are normal.  Neck: Normal range of motion. Neck supple.  Cardiovascular: Normal rate, regular rhythm and normal heart sounds.   No murmur heard. Pulmonary/Chest: Effort normal and breath sounds normal. She has no wheezes.  Abdominal: Soft. Bowel sounds are normal.  Musculoskeletal: Normal range of motion.  Right leg AV Graft present  Neurological: She is alert and oriented to person, place, and time.  Skin: Skin is warm and dry.  Psychiatric: She has a normal mood and affect. Her behavior is normal. Judgment and thought content normal.  Vitals reviewed.   Mallampati Score:  MD Evaluation Airway: WNL Heart: WNL Abdomen: WNL Chest/ Lungs:  WNL ASA  Classification:  3 Mallampati/Airway Score: Two  Imaging: Dg Chest 2 View  12/02/2014   CLINICAL DATA:  Central line placement.  Pneumonia.  EXAM: CHEST  2 VIEW  COMPARISON:  12/01/2014  FINDINGS: A new right-sided central venous catheter is seen with tip overlying the proximal right atrium. No pneumothorax visualized.  Mild cardiomegaly stable. Decreased interstitial prominence seen since prior study. No evidence of pulmonary airspace disease or pleural effusion.  IMPRESSION: New right sided central venous catheter tip overlies the proximal right atrium. No pneumothorax visualized.  Mild cardiomegaly.  No active lung disease.   Electronically Signed   By: Earle Gell M.D.   On: 12/02/2014 08:40   Dg Chest 2 View  12/01/2014   CLINICAL DATA:  Cough fever congestion and body aches associated with nausea vomiting and diarrhea for the past 2-3 days; history of COPD -asthma, diabetes, and kidney and pancreatic transplant  EXAM: CHEST  2 VIEW  COMPARISON:  Portable chest x-ray of November 10, 2014  FINDINGS: Top-normal in size. The pulmonary vascularity is less prominent today but remains mildly engorged. The mediastinum is normal in width. The bony thorax is unremarkable.  IMPRESSION: Mild pulmonary interstitial edema, improved since the previous study. There is no alveolar pneumonia.   Electronically Signed   By: David  Martinique M.D.   On: 12/01/2014 11:08   Dg Esophagus  11/15/2014   CLINICAL DATA:  Chest pain when swallowing.  Evaluate esophagus.  EXAM: ESOPHOGRAM/BARIUM SWALLOW  TECHNIQUE: Single contrast examination was performed using  thin barium.  FLUOROSCOPY TIME:  If the device does not provide the exposure index:  Fluoroscopy Time:  2 minutes and 24 seconds  Number of Acquired Images:  None  COMPARISON:  Chest radiograph of 11/10/2014.  FINDINGS: Focused exam was performed using single contrast technique and RAO position.  Evaluation of primary peristalsis demonstrates mild esophageal dysmotility, with contrast stasis in  the lower thoracic esophagus.  Full column evaluation of the esophagus demonstrates no persistent narrowing or stricture.  IMPRESSION: 1. No evidence of esophageal stricture. 2. Mild esophageal dysmotility, with contrast stasis in the lower thoracic esophagus.   Electronically Signed   By: Abigail Miyamoto M.D.   On: 11/15/2014 16:22   Ir Fluoro Guide Cv Line Right  12/01/2014   CLINICAL DATA:  DIABETIC KETOACIDOSIS, access for insulin drip appear  EXAM: RIGHT INTERNAL JUGULAR DOUBLE-LUMEN NON TUNNELED POWER PICC LINE PLACEMENT WITH ULTRASOUND AND FLUOROSCOPIC GUIDANCE  FLUOROSCOPY TIME:  30 SECONDS  PROCEDURE: The patient was advised of the possible risks andcomplications and agreed to undergo the procedure. The patient was then brought to the angiographic suite for the procedure.  The right neckwas prepped with chlorhexidine, drapedin the usual sterile fashion using maximum barrier technique (cap and mask, sterile gown, sterile gloves, large sterile sheet, hand hygiene and cutaneous antisepsis) and infiltrated locally with 1% Lidocaine.  Ultrasound demonstrated patency of the right internal vein, and this was documented with an image. Under real-time ultrasound guidance, this vein was accessed with a 21 gauge micropuncture needle and image documentation was performed. A 0.018 wire was introduced in to the vein. Over this, a 5 [place] double lumen non tunneled power PICC was advanced to the lower SVC/right atrial junction. Fluoroscopy during the procedure and fluoro spot radiograph confirms appropriate catheter position. The catheter was flushed and covered with asterile dressing.  Catheter length: 16  Complications: None immediate  IMPRESSION: Successful right internal jugular double-lumen non tunneled power PICC line placement with ultrasound  and fluoroscopic guidance. The catheter is ready for use.   Electronically Signed   By: Jerilynn Mages.  Shick M.D.   On: 12/01/2014 17:43   Ir US Guide Vasc Access Right  12/01/2014    CLINICAL DATA:  DIABETIC KETOACIDOSIS, access for insulin drip appear  EXAM: RIGHT INTERNAL JUGULAR DOUBLE-LUMEN NON TUNNELED POWER PICC LINE PLACEMENT WITH ULTRASOUND AND FLUOROSCOPIC GUIDANCE  FLUOROSCOPY TIME:  30 SECONDS  PROCEDURE: The patient was advised of the possible risks andcomplications and agreed to undergo the procedure. The patient was then brought to the angiographic suite for the procedure.  The right neckwas prepped with chlorhexidine, drapedin the usual sterile fashion using maximum barrier technique (cap and mask, sterile gown, sterile gloves, large sterile sheet, hand hygiene and cutaneous antisepsis) and infiltrated locally with 1% Lidocaine.  Ultrasound demonstrated patency of the right internal vein, and this was documented with an image. Under real-time ultrasound guidance, this vein was accessed with a 21 gauge micropuncture needle and image documentation was performed. A 0.018 wire was introduced in to the vein. Over this, a 5 [place] double lumen non tunneled power PICC was advanced to the lower SVC/right atrial junction. Fluoroscopy during the procedure and fluoro spot radiograph confirms appropriate catheter position. The catheter was flushed and covered with asterile dressing.  Catheter length: 16  Complications: None immediate  IMPRESSION: Successful right internal jugular double-lumen non tunneled power PICC line placement with ultrasound and fluoroscopic guidance. The catheter is ready for use.   Electronically Signed   By: Jerilynn Mages.  Shick M.D.   On: 12/01/2014 17:43   Dg Chest Port 1 View  11/10/2014   CLINICAL DATA:  Dyspnea  EXAM: PORTABLE CHEST - 1 VIEW  COMPARISON:  08/04/2014  FINDINGS: Chronic cardiomegaly, accentuated by hypoventilation and portable technique. Stable aortic and hilar contours, distorted from technique.  Interstitial and hazy density crowding at the bases without definite pneumonia or edema. No effusion or pneumothorax.  IMPRESSION: 1. Bibasilar opacity is likely  atelectasis given hypoventilation. No edema or definitive pneumonia. 2. Chronic cardiomegaly.   Electronically Signed   By: Monte Fantasia M.D.   On: 11/10/2014 10:06    Labs:  CBC:  Recent Labs  11/12/14 0400 11/13/14 0730 11/16/14 0726 12/01/14 1017  WBC 9.8 6.7 5.1 8.9  HGB 9.6* 9.5* 9.4* 10.8*  HCT 28.0* 27.7* 28.3* 35.3*  PLT 163 145* 169 108*    COAGS: No results for input(s): INR, APTT in the last 8760 hours.  BMP:  Recent Labs  12/01/14 1541 12/01/14 1900 12/02/14 0045 12/02/14 0345  NA 124* 128* 133* 132*  K 5.1 3.6 3.6 3.4*  CL 86* 89* 92* 93*  CO2 14* 24 24 23   GLUCOSE 910* 460* 123* 140*  BUN 43* 43* 47* 47*  CALCIUM 7.5* 7.7* 7.9* 7.6*  CREATININE 9.65* 9.78* 10.48* 10.58*  GFRNONAA 4* 4* 4* 4*  GFRAA 5* 5* 5* 5*    LIVER FUNCTION TESTS:  Recent Labs  01/19/14 0758  04/13/14 0752  08/03/14 0448 11/11/14 0837 11/13/14 0730 11/16/14 0726 12/01/14 1017  BILITOT 0.4  --  0.7  --  1.0  --   --   --  QUANTITY NOT SUFFICIENT, UNABLE TO PERFORM TEST  AST 10  --  13  --  34  --   --   --  64*  ALT 6  --  8  --  19  --   --   --  QUANTITY NOT SUFFICIENT, UNABLE TO PERFORM TEST  ALKPHOS  109  --  115  --  178*  --   --   --  134*  PROT 6.5  --  7.9  --  7.1  --   --   --  7.6  ALBUMIN 2.8*  < > 3.5  < > 3.6 3.0* 3.0* 3.0* 3.5  < > = values in this interval not displayed.  TUMOR MARKERS: No results for input(s): AFPTM, CEA, CA199, CHROMGRNA in the last 8760 hours.  Assessment and Plan:  ESRD on Dialysis via Right leg AV graft with complaints of prolonged bleeding. Will proceed with Shuntogram with possible angioplasty today by Dr. Vernard Gambles  Risks and Benefits discussed with the patient including, but not limited to bleeding, infection, vascular injury, pulmonary embolism, need for tunneled HD catheter placement or even death. All of the patient's questions were answered, patient is agreeable to proceed. Consent signed and in chart.   Thank you  for this interesting consult.  I greatly enjoyed meeting [name] and look forward to participating in their care.  Signed: Murrell Redden PA-C 12/02/2014, 9:22 AM   I spent a total of 20 Minutes    in face to face in clinical consultation, greater than 50% of which was counseling/coordinating care for shuntogram with possible angioplasty.

## 2014-12-02 NOTE — Progress Notes (Signed)
Utilization review completed. Lamona Eimer, RN, BSN. 

## 2014-12-02 NOTE — Procedures (Signed)
SHuntogram Venous PTA 09NA  No complication No blood loss. See complete dictation in [facility] [place].

## 2014-12-02 NOTE — Progress Notes (Signed)
[facility] PROGRESS NOTE  [name] YHC:[id number] DOB: [birth year] DOA: 12/01/2014 PCP: [name], MD  Assessment/Plan: 1. Diabetic ketoacidosis -Patient with history of insulin-dependent diabetes mellitus, having multiple previous hospitalizations for diabetic ketoacidosis, presented with an anion gap of 24 having blood sugar of 910 and bicarbonate of 14. -Placed on DKA protocol receiving IV fluids and IV insulin. -As of this morning NI gap has not closed -Will continue IV insulin with dextrose until anion gap has closed, at which point she will be transitioned to her basal insulin and have her diet advanced.  2. End-stage renal disease, on hemodialysis -Patient undergoing hemodialysis on Tuesdays Thursdays and Saturdays -She was evaluated by nephrology plan for hemodialysis today.  3. Possible acute bacterial bronchitis -Patient reporting cough with shortness of breath. -Initial workup included a chest x-ray which did not reveal evidence of infiltrate -She remains afebrile -White count within normal limits -Continue Doxy  4. Hyperkalemia -Likely secondary to diabetic ketoacidosis with potassium normalizing this morning. Labs showed potassium of 3.9  5. Hyponatremia -Likely a consequence of DKA/dehydration, initial labs showing sodium of 124, improved to 132 on a.m. lab work  6. Hypertension -Blood pressures stable -Continue Metoprolol 100 mg PO q daily  7. Dyslipidemia 8. History of left pontine CVA  Code Status: Full Code Family Communication: Family not present Disposition Plan: Anticipate discharge home when medically stable   Consultants:  Nephrology  Procedures:  HD  Antibiotics:  Doxy  HPI/Subjective: Patient is a 44 year old female with a past medical history of end-stage renal disease, insulin-dependent diabetes mellitus, having multiple prior hospitalizations for diabetic ketoacidosis, admitted to the medicine service on  12/01/2014 presented with complaints of malaise, feeling irritable, reporting elevated blood sugars. She had reported nonfunctioning glucometer. Initial lab work performed in the emergency room showed glucose of 1000, NI gap of 28 with bicarbonate of 11. Patient in diabetic ketoacidosis. She was started on IV insulin, IV fluids, admitted to stepdown unit and placed on DKA protocol.  Objective: Filed Vitals:   12/02/14 0315  BP: 109/56  Pulse: 67  Temp: 98 F (36.7 C)  Resp: 20    Intake/Output Summary (Last 24 hours) at 12/02/14 0656 Last data filed at 12/02/14 0600  Gross per 24 hour  Intake  78.09 ml  Output      0 ml  Net  78.09 ml   Filed Weights   12/01/14 0943  Weight: 68.4 kg (150 lb 12.7 oz)    Exam:   General:  Chronically ill appearing, not acute distress however, awake and alert  Cardiovascular: Regular rate and rhythm normal S1-S2  Respiratory: Coarse respiratory sounds, positive rhonchi, bibasilar crackles  Abdomen: Soft nontender nondistended  Musculoskeletal: No edema  Data Reviewed: Basic Metabolic Panel:  Recent Labs Lab 12/01/14 1017 12/01/14 1541 12/01/14 1900 12/02/14 0045 12/02/14 0345  NA 122* 124* 128* 133* 132*  K 6.5* 5.1 3.6 3.6 3.4*  CL 83* 86* 89* 92* 93*  CO2 11* 14* 24 24 23   GLUCOSE 1000* 910* 460* 123* 140*  BUN 38* 43* 43* 47* 47*  CREATININE 8.96* 9.65* 9.78* 10.48* 10.58*  CALCIUM 7.8* 7.5* 7.7* 7.9* 7.6*   Liver Function Tests:  Recent Labs Lab 12/01/14 1017  AST 64*  ALT QUANTITY NOT SUFFICIENT, UNABLE TO PERFORM TEST  ALKPHOS 134*  BILITOT QUANTITY NOT SUFFICIENT, UNABLE TO PERFORM TEST  PROT 7.6  ALBUMIN 3.5   No results for input(s): LIPASE, AMYLASE in the last 168 hours. No results for input(s):  AMMONIA in the last 168 hours. CBC:  Recent Labs Lab 12/01/14 1017  WBC 8.9  NEUTROABS 7.5  HGB 10.8*  HCT 35.3*  MCV 103.5*  PLT 108*   Cardiac Enzymes: No results for input(s): CKTOTAL, CKMB,  CKMBINDEX, TROPONINI in the last 168 hours. BNP (last 3 results) No results for input(s): BNP in the last 8760 hours.  ProBNP (last 3 results)  Recent Labs  01/14/14 0216  PROBNP 17541.0*    CBG:  Recent Labs Lab 12/02/14 0149 12/02/14 0257 12/02/14 0400 12/02/14 0505 12/02/14 0614  GLUCAP 118* 145* 114* 119* 160*    Recent Results (from the past 240 hour(s))  Culture, blood (routine x 2)     Status: None (Preliminary result)   Collection Time: 12/01/14 10:43 AM  Result Value Ref Range Status   Specimen Description BLOOD LEFT ANTECUBITAL  Final   Special Requests BOTTLES DRAWN AEROBIC AND ANAEROBIC 5CC  Final   Culture PENDING  Incomplete   Report Status PENDING  Incomplete  MRSA PCR Screening     Status: Abnormal   Collection Time: 12/01/14  2:50 PM  Result Value Ref Range Status   MRSA by PCR INVALID RESULTS, SPECIMEN SENT FOR CULTURE (A) NEGATIVE Final    Comment: CALLED TO C.WOODARD,RN BY L.PITT 12/01/14        The GeneXpert MRSA Assay (FDA approved for NASAL specimens only), is one component of a comprehensive MRSA colonization surveillance program. It is not intended to diagnose MRSA infection nor to guide or monitor treatment for MRSA infections.      Studies: Dg Chest 2 View  12/01/2014   CLINICAL DATA:  Cough fever congestion and body aches associated with nausea vomiting and diarrhea for the past 2-3 days; history of COPD -asthma, diabetes, and kidney and pancreatic transplant  EXAM: CHEST  2 VIEW  COMPARISON:  Portable chest x-ray of November 10, 2014  FINDINGS: Top-normal in size. The pulmonary vascularity is less prominent today but remains mildly engorged. The mediastinum is normal in width. The bony thorax is unremarkable.  IMPRESSION: Mild pulmonary interstitial edema, improved since the previous study. There is no alveolar pneumonia.   Electronically Signed   By: David  Martinique M.D.   On: 12/01/2014 11:08   Ir Fluoro Guide Cv Line Right  12/01/2014    CLINICAL DATA:  DIABETIC KETOACIDOSIS, access for insulin drip appear  EXAM: RIGHT INTERNAL JUGULAR DOUBLE-LUMEN NON TUNNELED POWER PICC LINE PLACEMENT WITH ULTRASOUND AND FLUOROSCOPIC GUIDANCE  FLUOROSCOPY TIME:  30 SECONDS  PROCEDURE: The patient was advised of the possible risks andcomplications and agreed to undergo the procedure. The patient was then brought to the angiographic suite for the procedure.  The right neckwas prepped with chlorhexidine, drapedin the usual sterile fashion using maximum barrier technique (cap and mask, sterile gown, sterile gloves, large sterile sheet, hand hygiene and cutaneous antisepsis) and infiltrated locally with 1% Lidocaine.  Ultrasound demonstrated patency of the right internal vein, and this was documented with an image. Under real-time ultrasound guidance, this vein was accessed with a 21 gauge micropuncture needle and image documentation was performed. A 0.018 wire was introduced in to the vein. Over this, a 5 [place] double lumen non tunneled power PICC was advanced to the lower SVC/right atrial junction. Fluoroscopy during the procedure and fluoro spot radiograph confirms appropriate catheter position. The catheter was flushed and covered with asterile dressing.  Catheter length: 16  Complications: None immediate  IMPRESSION: Successful right internal jugular double-lumen non tunneled power PICC line  placement with ultrasound and fluoroscopic guidance. The catheter is ready for use.   Electronically Signed   By: Jerilynn Mages.  Shick M.D.   On: 12/01/2014 17:43   Ir US Guide Vasc Access Right  12/01/2014   CLINICAL DATA:  DIABETIC KETOACIDOSIS, access for insulin drip appear  EXAM: RIGHT INTERNAL JUGULAR DOUBLE-LUMEN NON TUNNELED POWER PICC LINE PLACEMENT WITH ULTRASOUND AND FLUOROSCOPIC GUIDANCE  FLUOROSCOPY TIME:  30 SECONDS  PROCEDURE: The patient was advised of the possible risks andcomplications and agreed to undergo the procedure. The patient was then brought to the  angiographic suite for the procedure.  The right neckwas prepped with chlorhexidine, drapedin the usual sterile fashion using maximum barrier technique (cap and mask, sterile gown, sterile gloves, large sterile sheet, hand hygiene and cutaneous antisepsis) and infiltrated locally with 1% Lidocaine.  Ultrasound demonstrated patency of the right internal vein, and this was documented with an image. Under real-time ultrasound guidance, this vein was accessed with a 21 gauge micropuncture needle and image documentation was performed. A 0.018 wire was introduced in to the vein. Over this, a 5 [place] double lumen non tunneled power PICC was advanced to the lower SVC/right atrial junction. Fluoroscopy during the procedure and fluoro spot radiograph confirms appropriate catheter position. The catheter was flushed and covered with asterile dressing.  Catheter length: 16  Complications: None immediate  IMPRESSION: Successful right internal jugular double-lumen non tunneled power PICC line placement with ultrasound and fluoroscopic guidance. The catheter is ready for use.   Electronically Signed   By: Jerilynn Mages.  Shick M.D.   On: 12/01/2014 17:43    Scheduled Meds: . sodium chloride   Intravenous STAT  . aspirin  325 mg Oral Daily  . calcium acetate  667 mg Oral BID WC  . clonazePAM  0.5 mg Oral BID  . Darbepoetin Alfa  40 mcg Intravenous Q Thu-HD  . doxercalciferol  3 mcg Intravenous Q T,Th,Sa-HD  . doxycycline  100 mg Oral Q12H  . enoxaparin (LOVENOX) injection  30 mg Subcutaneous Q24H  . gabapentin  100 mg Oral BID  . ipratropium-albuterol  3 mL Nebulization TID  . metoCLOPramide  5 mg Oral TID AC & HS  . metoprolol succinate  100 mg Oral Daily  . montelukast  10 mg Oral QHS  . multivitamin  1 tablet Oral QHS  . pantoprazole  40 mg Oral QHS  . simvastatin  20 mg Oral QHS   Continuous Infusions: . sodium chloride    . sodium chloride 1,000 mL (12/01/14 1401)  . dextrose 5 % and 0.45% NaCl 10 mL/hr at 12/01/14  2107  . insulin (NOVOLIN-R) infusion 0.5 Units/hr (12/02/14 0615)    Principal Problem:   DKA (diabetic ketoacidoses) Active Problems:   Hyperlipidemia   GERD (gastroesophageal reflux disease)   RLS (restless legs syndrome)   History of simultaneous kidney and pancreas transplant   Gastroparesis   End-stage renal disease on hemodialysis   Recent Left pontine CVA   Hyperkalemia   HTN (hypertension)   Acute bronchitis   Asthma    Time spent: 35 min    Kelvin Cellar  [facility] Pager [phone]. If 7PM-7AM, please contact night-coverage at [email or web address], password [facility] 12/02/2014, 6:56 AM  LOS: 1 day

## 2014-12-03 LAB — GLUCOSE, CAPILLARY
GLUCOSE-CAPILLARY: 139 mg/dL — AB (ref 65–99)
GLUCOSE-CAPILLARY: 159 mg/dL — AB (ref 65–99)
GLUCOSE-CAPILLARY: 79 mg/dL (ref 65–99)
GLUCOSE-CAPILLARY: 93 mg/dL (ref 65–99)
Glucose-Capillary: 328 mg/dL — ABNORMAL HIGH (ref 65–99)
Glucose-Capillary: 134 mg/dL — ABNORMAL HIGH (ref 65–99)
Glucose-Capillary: 101 mg/dL — ABNORMAL HIGH (ref 65–99)
Glucose-Capillary: 155 mg/dL — ABNORMAL HIGH (ref 65–99)
Glucose-Capillary: 106 mg/dL — ABNORMAL HIGH (ref 65–99)
Glucose-Capillary: 77 mg/dL (ref 65–99)
Glucose-Capillary: 87 mg/dL (ref 65–99)
Glucose-Capillary: 194 mg/dL — ABNORMAL HIGH (ref 65–99)
Glucose-Capillary: 188 mg/dL — ABNORMAL HIGH (ref 65–99)
Glucose-Capillary: 197 mg/dL — ABNORMAL HIGH (ref 65–99)

## 2014-12-03 LAB — CBC
HCT: 29.1 % — ABNORMAL LOW (ref 36.0–46.0)
Hemoglobin: 9.3 g/dL — ABNORMAL LOW (ref 12.0–15.0)
MCH: 30.4 pg (ref 26.0–34.0)
MCHC: 32 g/dL (ref 30.0–36.0)
MCV: 95.1 fL (ref 78.0–100.0)
PLATELETS: 120 10*3/uL — AB (ref 150–400)
RBC: 3.06 MIL/uL — ABNORMAL LOW (ref 3.87–5.11)
RDW: 15.3 % (ref 11.5–15.5)
WBC: 4.8 10*3/uL (ref 4.0–10.5)

## 2014-12-03 LAB — MRSA CULTURE

## 2014-12-03 LAB — BASIC METABOLIC PANEL
Anion gap: 10 (ref 5–15)
BUN: 12 mg/dL (ref 6–20)
CALCIUM: 7.4 mg/dL — AB (ref 8.9–10.3)
CHLORIDE: 97 mmol/L — AB (ref 101–111)
CO2: 27 mmol/L (ref 22–32)
CREATININE: 5.07 mg/dL — AB (ref 0.44–1.00)
GFR calc Af Amer: 11 mL/min — ABNORMAL LOW (ref >60)
GFR calc non Af Amer: 10 mL/min — ABNORMAL LOW (ref >60)
GLUCOSE: 177 mg/dL — AB (ref 65–99)
Potassium: 3.4 mmol/L — ABNORMAL LOW (ref 3.5–5.1)
Sodium: 134 mmol/L — ABNORMAL LOW (ref 135–145)

## 2014-12-03 LAB — RENAL FUNCTION PANEL
ALBUMIN: 3 g/dL — AB (ref 3.5–5.0)
Anion gap: 13 (ref 5–15)
BUN: 17 mg/dL (ref 6–20)
CHLORIDE: 98 mmol/L — AB (ref 101–111)
CO2: 26 mmol/L (ref 22–32)
Calcium: 8.1 mg/dL — ABNORMAL LOW (ref 8.9–10.3)
Creatinine, Ser: 6.56 mg/dL — ABNORMAL HIGH (ref 0.44–1.00)
GFR calc Af Amer: 8 mL/min — ABNORMAL LOW (ref >60)
GFR calc non Af Amer: 7 mL/min — ABNORMAL LOW (ref >60)
Glucose, Bld: 205 mg/dL — ABNORMAL HIGH (ref 65–99)
PHOSPHORUS: 2.4 mg/dL — AB (ref 2.5–4.6)
Potassium: 4.2 mmol/L (ref 3.5–5.1)
Sodium: 137 mmol/L (ref 135–145)

## 2014-12-03 LAB — HEPATITIS B SURFACE ANTIGEN: Hepatitis B Surface Ag: NEGATIVE

## 2014-12-03 MED ORDER — INSULIN ASPART 100 UNIT/ML ~~LOC~~ SOLN
7.0000 [IU] | Freq: Three times a day (TID) | SUBCUTANEOUS | Status: DC
  Administered 2014-12-03 – 2014-12-04 (×4): 7 [IU] via SUBCUTANEOUS

## 2014-12-03 MED ORDER — PROMETHAZINE HCL 25 MG/ML IJ SOLN
25.0000 mg | Freq: Once | INTRAMUSCULAR | Status: AC
  Administered 2014-12-03: 25 mg via INTRAVENOUS
  Filled 2014-12-03: qty 1

## 2014-12-03 MED ORDER — METOPROLOL SUCCINATE ER 50 MG PO TB24
50.0000 mg | ORAL_TABLET | Freq: Every day | ORAL | Status: DC
  Administered 2014-12-03 – 2014-12-06 (×4): 50 mg via ORAL
  Filled 2014-12-03 (×4): qty 1

## 2014-12-03 MED ORDER — PENTAFLUOROPROP-TETRAFLUOROETH EX AERO: 1.0000 "application " | INHALATION_SPRAY | CUTANEOUS | Status: DC | PRN

## 2014-12-03 MED ORDER — ONDANSETRON HCL 4 MG/2ML IJ SOLN
4.0000 mg | Freq: Four times a day (QID) | INTRAMUSCULAR | Status: DC | PRN
  Administered 2014-12-03: 4 mg via INTRAVENOUS
  Filled 2014-12-03 (×2): qty 2

## 2014-12-03 MED ORDER — LIDOCAINE-PRILOCAINE 2.5-2.5 % EX CREA
1.0000 "application " | TOPICAL_CREAM | CUTANEOUS | Status: DC | PRN
  Filled 2014-12-03: qty 5

## 2014-12-03 MED ORDER — SODIUM CHLORIDE 0.9 % IV SOLN: 100.0000 mL | INTRAVENOUS | Status: DC | PRN

## 2014-12-03 MED ORDER — NEPRO/CARBSTEADY PO LIQD: 237.0000 mL | ORAL | Status: DC | PRN

## 2014-12-03 MED ORDER — PROMETHAZINE HCL 25 MG/ML IJ SOLN
12.5000 mg | INTRAMUSCULAR | Status: DC | PRN
  Administered 2014-12-03 – 2014-12-06 (×11): 12.5 mg via INTRAVENOUS
  Filled 2014-12-03 (×12): qty 1

## 2014-12-03 MED ORDER — INSULIN GLARGINE 100 UNIT/ML ~~LOC~~ SOLN
7.0000 [IU] | Freq: Two times a day (BID) | SUBCUTANEOUS | Status: DC
  Administered 2014-12-03 – 2014-12-04 (×3): 7 [IU] via SUBCUTANEOUS
  Filled 2014-12-03 (×4): qty 0.07

## 2014-12-03 MED ORDER — INSULIN ASPART 100 UNIT/ML ~~LOC~~ SOLN
0.0000 [IU] | Freq: Three times a day (TID) | SUBCUTANEOUS | Status: DC
  Administered 2014-12-03: 2 [IU] via SUBCUTANEOUS
  Administered 2014-12-03 – 2014-12-04 (×3): 3 [IU] via SUBCUTANEOUS

## 2014-12-03 MED ORDER — INSULIN ASPART 100 UNIT/ML ~~LOC~~ SOLN: 0.0000 [IU] | Freq: Every day | SUBCUTANEOUS | Status: DC

## 2014-12-03 MED ORDER — ALTEPLASE 2 MG IJ SOLR
2.0000 mg | Freq: Once | INTRAMUSCULAR | Status: AC | PRN
  Filled 2014-12-03: qty 2

## 2014-12-03 MED ORDER — LIDOCAINE HCL (PF) 1 % IJ SOLN: 5.0000 mL | INTRAMUSCULAR | Status: DC | PRN

## 2014-12-03 MED ORDER — SODIUM CHLORIDE 0.9 % IJ SOLN
10.0000 mL | Freq: Two times a day (BID) | INTRAMUSCULAR | Status: DC
  Administered 2014-12-04: 10 mL

## 2014-12-03 MED ORDER — SODIUM CHLORIDE 0.9 % IJ SOLN
10.0000 mL | INTRAMUSCULAR | Status: DC | PRN
  Administered 2014-12-04 – 2014-12-05 (×2): 20 mL
  Filled 2014-12-03 (×2): qty 40

## 2014-12-03 MED ORDER — HEPARIN SODIUM (PORCINE) 1000 UNIT/ML DIALYSIS
1000.0000 [IU] | INTRAMUSCULAR | Status: DC | PRN
Start: 2014-12-03 — End: 2014-12-04

## 2014-12-03 NOTE — Progress Notes (Signed)
Patient being transferred to 6E09. Report called to receiving nurse.

## 2014-12-03 NOTE — Progress Notes (Signed)
  [facility] Progress Note   Subjective: nauseous this morning  Filed Vitals:   12/03/14 0000 12/03/14 0023 12/03/14 0345 12/03/14 0444  BP: 109/52  99/57   Pulse: 87  85   Temp:  100.1 F (37.8 C)  98.9 F (37.2 C)  TempSrc:  Oral  Oral  Resp: 13     Height:      Weight:      SpO2: 100%  100%    Exam: Alert, no distress No jvd Chest bibasilar coarse rales RRR no MRG ABd soft, ntnd no ascites R thigh AVG patent Neuro is ox 3  HD: TTS NW 3hr 45 mins 68kgs 2K/2.5ca R thigh AVG No heparin Hectorol 3 Micera 150 q 2 weeks- last dose given 6/30      Assessment: 1. DKA- per primary 2. Acute bronchitis - getting doxy po bid 3. ESRD TTS HD 4. HTN on MTP, BP good 5. Volume at dry wt, euvolemic 6. Anemia cont meds 7. MBD cont meds 8. 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   Plan - HD tomorrow, min UF    Kelly Splinter MD  pager [phone]    cell [phone]  12/03/2014, 8:31 AM     Recent Labs Lab 12/02/14 0345 12/02/14 1102 12/03/14 0500  NA 132* 133* 134*  K 3.4* 3.9 3.4*  CL 93* 94* 97*  CO2 23 24 27   GLUCOSE 140* 212* 177*  BUN 47* 51* 12  CREATININE 10.58* 11.28* 5.07*  CALCIUM 7.6* 7.7* 7.4*    Recent Labs Lab 12/01/14 1017  AST 64*  ALT QUANTITY NOT SUFFICIENT, UNABLE TO PERFORM TEST  ALKPHOS 134*  BILITOT QUANTITY NOT SUFFICIENT, UNABLE TO PERFORM TEST  PROT 7.6  ALBUMIN 3.5    Recent Labs Lab 12/01/14 1017 12/03/14 0727  WBC 8.9 4.8  NEUTROABS 7.5  --   HGB 10.8* 9.3*  HCT 35.3* 29.1*  MCV 103.5* 95.1  PLT 108* 120*   . aspirin  325 mg Oral Daily  . benzonatate  100 mg Oral TID  . calcium acetate  667 mg Oral BID WC  . clonazePAM  0.5 mg Oral BID  . Darbepoetin Alfa  40 mcg Intravenous Q Thu-HD  . doxercalciferol  3 mcg Intravenous Q T,Th,Sa-HD  . doxycycline  100 mg Oral Q12H  . enoxaparin (LOVENOX) injection  30 mg Subcutaneous Q24H  . gabapentin  100 mg Oral BID  . insulin aspart   0-15 Units Subcutaneous TID WC  . insulin aspart  0-5 Units Subcutaneous QHS  . insulin aspart  7 Units Subcutaneous TID WC  . insulin glargine  7 Units Subcutaneous BID  . ipratropium-albuterol  3 mL Nebulization TID  . metoCLOPramide  5 mg Oral TID AC & HS  . metoprolol succinate  50 mg Oral Daily  . montelukast  10 mg Oral QHS  . multivitamin  1 tablet Oral QHS  . pantoprazole  40 mg Oral QHS  . simvastatin  20 mg Oral QHS   . sodium chloride    . sodium chloride 1,000 mL (12/01/14 1401)   acetaminophen, ondansetron (ZOFRAN) IV, oxyCODONE, promethazine, zolpidem

## 2014-12-03 NOTE — Progress Notes (Signed)
Transfer note:  Arrival Method: wheelchair Mental Orientation:A&Ox4 Telemetry:none Assessment: see doc flowsheet Skin: Dry, intact IV: left IJ Pain: Denies pain Tubes:none Fall Prevention Safety Plan: Reviewed with pt 6700 Orientation: Patient has been oriented to the unit, staff and to the room.

## 2014-12-03 NOTE — Progress Notes (Addendum)
Inpatient Diabetes Program Recommendations  AACE/ADA: New Consensus Statement on Inpatient Glycemic Control (2013)  Target Ranges:  Prepandial:   less than 140 mg/dL      Peak postprandial:   less than 180 mg/dL (1-2 hours)      Critically ill patients:  140 - 180 mg/dL   Consider reducing Novolog correction to sensitive starting at 150 mg/dL and reduce Novolog to 5 units tid with meals.   Will text page MD also.   Thanks, Adah Perl, RN, BC-ADM Inpatient Diabetes Coordinator Pager [phone] (8a-5p)

## 2014-12-03 NOTE — Care Management (Signed)
Important Message  Patient Details  Name: CAMMIE FAULSTICH MRN: [id number] Date of Birth: 07-Aug-1970   Medicare Important Message Given:  Yes-second notification given    Girard Cooter, RN 12/03/2014, 10:58 AM

## 2014-12-03 NOTE — Progress Notes (Addendum)
[facility] PROGRESS NOTE  [name] URK:[id number] DOB: [birth year] DOA: 12/01/2014 PCP: [name] Gene, MD  Assessment/Plan: 1. Diabetic ketoacidosis -Patient with history of insulin-dependent diabetes mellitus, having multiple previous hospitalizations for diabetic ketoacidosis, presented with an anion gap of 24 having blood sugar of 910 and bicarbonate of 14. -Placed on DKA protocol receiving IV fluids and IV insulin. -Her INR gap closed overnight, will discontinue IV insulin, advance her diet, transition to basal insulin with Lantus 7 units subcutaneous twice a day. Will provide 7 units of NovoLog for meal time coverage  2. End-stage renal disease, on hemodialysis -Patient undergoing hemodialysis on Tuesdays Thursdays and Saturdays -She was evaluated by nephrology plan for hemodialysis today.  3. Possible acute bacterial bronchitis -Patient reporting cough with shortness of breath. -Initial workup included a chest x-ray which did not reveal evidence of infiltrate -She remains afebrile -White count within normal limits -Continue Doxy  4. Hyperkalemia -Likely secondary to diabetic ketoacidosis with potassium normalizing this morning. Labs showed potassium of 3.9  5. Hyponatremia -Likely a consequence of DKA/dehydration, initial labs showing sodium of 124 -Labs showing a sodium of 134  6. Hypertension -Because of low blood pressures I decreased her metoprolol to 50 mg by mouth daily from 100 mg  7. Dyslipidemia 8. History of left pontine CVA  Code Status: Full Code Family Communication: Family not present Disposition Plan: Anticipate discharge home the next 24 hours if she remains stable   Consultants:  Nephrology  Procedures:  HD  Antibiotics:  Doxy  HPI/Subjective: Patient is a 44 year old female with a past medical history of end-stage renal disease, insulin-dependent diabetes mellitus, having multiple prior hospitalizations for diabetic  ketoacidosis, admitted to the medicine service on 12/01/2014 presented with complaints of malaise, feeling irritable, reporting elevated blood sugars. She had reported nonfunctioning glucometer. Initial lab work performed in the emergency room showed glucose of 1000, NI gap of 28 with bicarbonate of 11. Patient in diabetic ketoacidosis. She was started on IV insulin, IV fluids, admitted to stepdown unit and placed on DKA protocol.  Objective: Filed Vitals:   12/03/14 1021  BP: 116/68  Pulse: 106  Temp: 100.5 F (38.1 C)  Resp: 17    Intake/Output Summary (Last 24 hours) at 12/03/14 1551 Last data filed at 12/03/14 1244  Gross per 24 hour  Intake 1585.96 ml  Output   1000 ml  Net 585.96 ml   Filed Weights   12/01/14 0943 12/02/14 1712 12/02/14 2121  Weight: 68.4 kg (150 lb 12.7 oz) 69 kg (152 lb 1.9 oz) 67.3 kg (148 lb 5.9 oz)    Exam:   General:  Patient was sleeping in room, arousable, following commands.  Cardiovascular: Regular rate and rhythm normal S1-S2  Respiratory: Normal respiratory effort, overall lungs clear to auscultation bilaterally  Abdomen: Soft nontender nondistended  Musculoskeletal: No edema  Data Reviewed: Basic Metabolic Panel:  Recent Labs Lab 12/01/14 1900 12/02/14 0045 12/02/14 0345 12/02/14 1102 12/03/14 0500  NA 128* 133* 132* 133* 134*  K 3.6 3.6 3.4* 3.9 3.4*  CL 89* 92* 93* 94* 97*  CO2 24 24 23 24 27   GLUCOSE 460* 123* 140* 212* 177*  BUN 43* 47* 47* 51* 12  CREATININE 9.78* 10.48* 10.58* 11.28* 5.07*  CALCIUM 7.7* 7.9* 7.6* 7.7* 7.4*   Liver Function Tests:  Recent Labs Lab 12/01/14 1017  AST 64*  ALT QUANTITY NOT SUFFICIENT, UNABLE TO PERFORM TEST  ALKPHOS 134*  BILITOT QUANTITY NOT SUFFICIENT, UNABLE TO PERFORM TEST  PROT  7.6  ALBUMIN 3.5   No results for input(s): LIPASE, AMYLASE in the last 168 hours. No results for input(s): AMMONIA in the last 168 hours. CBC:  Recent Labs Lab 12/01/14 1017 12/03/14 0727   WBC 8.9 4.8  NEUTROABS 7.5  --   HGB 10.8* 9.3*  HCT 35.3* 29.1*  MCV 103.5* 95.1  PLT 108* 120*   Cardiac Enzymes: No results for input(s): CKTOTAL, CKMB, CKMBINDEX, TROPONINI in the last 168 hours. BNP (last 3 results) No results for input(s): BNP in the last 8760 hours.  ProBNP (last 3 results)  Recent Labs  01/14/14 0216  PROBNP 17541.0*    CBG:  Recent Labs Lab 12/03/14 0641 12/03/14 0737 12/03/14 0844 12/03/14 1018 12/03/14 1131  GLUCAP 87 139* 194* 188* 197*    Recent Results (from the past 240 hour(s))  Culture, blood (routine x 2)     Status: None (Preliminary result)   Collection Time: 12/01/14 10:17 AM  Result Value Ref Range Status   Specimen Description BLOOD LEFT THUMB  Final   Special Requests BOTTLES DRAWN AEROBIC ONLY 2CC  Final   Culture NO GROWTH 2 DAYS  Final   Report Status PENDING  Incomplete  Culture, blood (routine x 2)     Status: None (Preliminary result)   Collection Time: 12/01/14 10:43 AM  Result Value Ref Range Status   Specimen Description BLOOD LEFT ANTECUBITAL  Final   Special Requests BOTTLES DRAWN AEROBIC AND ANAEROBIC 5CC  Final   Culture NO GROWTH 2 DAYS  Final   Report Status PENDING  Incomplete  MRSA PCR Screening     Status: Abnormal   Collection Time: 12/01/14  2:50 PM  Result Value Ref Range Status   MRSA by PCR INVALID RESULTS, SPECIMEN SENT FOR CULTURE (A) NEGATIVE Final    Comment: CALLED TO C.WOODARD,RN BY L.PITT 12/01/14        The GeneXpert MRSA Assay (FDA approved for NASAL specimens only), is one component of a comprehensive MRSA colonization surveillance program. It is not intended to diagnose MRSA infection nor to guide or monitor treatment for MRSA infections.   MRSA culture     Status: None   Collection Time: 12/01/14  2:50 PM  Result Value Ref Range Status   Specimen Description NASOPHARYNGEAL  Final   Special Requests NONE  Final   Culture NOMRSA Performed at [facility]   Final    Report Status 12/03/2014 FINAL  Final     Studies: Dg Chest 2 View  12/02/2014   CLINICAL DATA:  Central line placement.  Pneumonia.  EXAM: CHEST  2 VIEW  COMPARISON:  12/01/2014  FINDINGS: A new right-sided central venous catheter is seen with tip overlying the proximal right atrium. No pneumothorax visualized.  Mild cardiomegaly stable. Decreased interstitial prominence seen since prior study. No evidence of pulmonary airspace disease or pleural effusion.  IMPRESSION: New right sided central venous catheter tip overlies the proximal right atrium. No pneumothorax visualized.  Mild cardiomegaly.  No active lung disease.   Electronically Signed   By: Earle Gell M.D.   On: 12/02/2014 08:40   Ir Pta Venous Right  12/02/2014   CLINICAL DATA:  End stage renal disease, prolonged bleeding after hemodialysis sections  FLUOROSCOPY TIME:  1 minutes 6 seconds, and 237 mGy  COMPARISON:  04/16/2014  EXAM: DIALYSIS SHUNTOGRAM  VENOUS ANGIOPLASTY  TECHNIQUE: The procedure, risks (including but not limited to bleeding, infection, organ damage ), benefits, and alternatives were explained to the patient.  Questions regarding the procedure were encouraged and answered. The patient understands and consents to the procedure.  An 18-gauge angiocatheter was placed antegrade into the arterial limb of the patient's right thigh synthetic loopAV hemodialysis fistula for dialysis fistulography. The angiocatheter and surrounding skin were then prepped with Betadine, draped in usual sterile fashion, infiltrated locally with 1% lidocaine.  Intravenous Fentanyl and Versed were administered as conscious sedation during continuous cardiorespiratory monitoring by the radiology RN, with a total moderate sedation time of 10 minutes.  The angiocatheter was exchanged over a Benson wire for a 6 [place] vascular sheath, through which a 12 mm x 4 cm atlas angioplasty balloon was advanced to the level of a venous outflowstenosis for venous angioplasty  using 60 second overlapping inflations at 18 atmospheres. After follow-up venography, additional PTA was performed with a 14 mm x 4 cm atlas balloon. After final venography, the catheter, sheath, and guidewire were removed and hemostasis achieved with a 2-0 Ethilon purse-string suture. No immediate complication.  FINDINGS: Arterial anastomosis is widely patent. There is mild irregularity in the venous limb and apex of the graft without significant intragraft stenosis. The venous anastomosis is patent. The outflow common femoral vein has a moderate tapered narrowing, and there is another focus of moderate short segment narrowing in the right external iliac vein just inferior to the and iliac venous stent. Body wall collaterals draining around this level from the common femoral vein attest to the hemodynamic significance of these lesions. The iliac venous stent is widely patent, and native venous outflow more centrally through the IVC is widely patent.  The outflow vein stenoses responded moderately well to 12 mm balloon angioplasty but follow-up venography demonstrated significant recoil resulting greater than 50% diameter stenosis. No extravasation or dissection. Additional angioplasty with a 14 mm balloon resulted in improved post angiographic appearance with minimal recoil, decreased filling of collateral venous channels, no extravasation or stenosis.  IMPRESSION: 1. 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  ACCESS: Remains approachable for percutaneous intervention as needed.   Electronically Signed   By: Lucrezia Europe M.D.   On: 12/02/2014 11:09   Ir Fluoro Guide Cv Line Right  12/01/2014   CLINICAL DATA:  DIABETIC KETOACIDOSIS, access for insulin drip appear  EXAM: RIGHT INTERNAL JUGULAR DOUBLE-LUMEN NON TUNNELED POWER PICC LINE PLACEMENT WITH ULTRASOUND AND FLUOROSCOPIC GUIDANCE  FLUOROSCOPY TIME:  30 SECONDS  PROCEDURE: The patient was advised of the possible risks  andcomplications and agreed to undergo the procedure. The patient was then brought to the angiographic suite for the procedure.  The right neckwas prepped with chlorhexidine, drapedin the usual sterile fashion using maximum barrier technique (cap and mask, sterile gown, sterile gloves, large sterile sheet, hand hygiene and cutaneous antisepsis) and infiltrated locally with 1% Lidocaine.  Ultrasound demonstrated patency of the right internal vein, and this was documented with an image. Under real-time ultrasound guidance, this vein was accessed with a 21 gauge micropuncture needle and image documentation was performed. A 0.018 wire was introduced in to the vein. Over this, a 5 [place] double lumen non tunneled power PICC was advanced to the lower SVC/right atrial junction. Fluoroscopy during the procedure and fluoro spot radiograph confirms appropriate catheter position. The catheter was flushed and covered with asterile dressing.  Catheter length: 16  Complications: None immediate  IMPRESSION: Successful right internal jugular double-lumen non tunneled power PICC line placement with ultrasound and fluoroscopic guidance. The catheter is ready for use.   Electronically Signed  By: Eugenie Filler M.D.   On: 12/01/2014 17:43   Ir US Guide Vasc Access Right  12/01/2014   CLINICAL DATA:  DIABETIC KETOACIDOSIS, access for insulin drip appear  EXAM: RIGHT INTERNAL JUGULAR DOUBLE-LUMEN NON TUNNELED POWER PICC LINE PLACEMENT WITH ULTRASOUND AND FLUOROSCOPIC GUIDANCE  FLUOROSCOPY TIME:  30 SECONDS  PROCEDURE: The patient was advised of the possible risks andcomplications and agreed to undergo the procedure. The patient was then brought to the angiographic suite for the procedure.  The right neckwas prepped with chlorhexidine, drapedin the usual sterile fashion using maximum barrier technique (cap and mask, sterile gown, sterile gloves, large sterile sheet, hand hygiene and cutaneous antisepsis) and infiltrated locally with 1%  Lidocaine.  Ultrasound demonstrated patency of the right internal vein, and this was documented with an image. Under real-time ultrasound guidance, this vein was accessed with a 21 gauge micropuncture needle and image documentation was performed. A 0.018 wire was introduced in to the vein. Over this, a 5 [place] double lumen non tunneled power PICC was advanced to the lower SVC/right atrial junction. Fluoroscopy during the procedure and fluoro spot radiograph confirms appropriate catheter position. The catheter was flushed and covered with asterile dressing.  Catheter length: 16  Complications: None immediate  IMPRESSION: Successful right internal jugular double-lumen non tunneled power PICC line placement with ultrasound and fluoroscopic guidance. The catheter is ready for use.   Electronically Signed   By: Jerilynn Mages.  Shick M.D.   On: 12/01/2014 17:43   Ir Shuntogram/ Fistulagram Right Mod Sed  12/02/2014   CLINICAL DATA:  End stage renal disease, prolonged bleeding after hemodialysis sections  FLUOROSCOPY TIME:  1 minutes 6 seconds, and 237 mGy  COMPARISON:  04/16/2014  EXAM: DIALYSIS SHUNTOGRAM  VENOUS ANGIOPLASTY  TECHNIQUE: The procedure, risks (including but not limited to bleeding, infection, organ damage ), benefits, and alternatives were explained to the patient. Questions regarding the procedure were encouraged and answered. The patient understands and consents to the procedure.  An 18-gauge angiocatheter was placed antegrade into the arterial limb of the patient's right thigh synthetic loopAV hemodialysis fistula for dialysis fistulography. The angiocatheter and surrounding skin were then prepped with Betadine, draped in usual sterile fashion, infiltrated locally with 1% lidocaine.  Intravenous Fentanyl and Versed were administered as conscious sedation during continuous cardiorespiratory monitoring by the radiology RN, with a total moderate sedation time of 10 minutes.  The angiocatheter was exchanged over a  Benson wire for a 6 [place] vascular sheath, through which a 12 mm x 4 cm atlas angioplasty balloon was advanced to the level of a venous outflowstenosis for venous angioplasty using 60 second overlapping inflations at 18 atmospheres. After follow-up venography, additional PTA was performed with a 14 mm x 4 cm atlas balloon. After final venography, the catheter, sheath, and guidewire were removed and hemostasis achieved with a 2-0 Ethilon purse-string suture. No immediate complication.  FINDINGS: Arterial anastomosis is widely patent. There is mild irregularity in the venous limb and apex of the graft without significant intragraft stenosis. The venous anastomosis is patent. The outflow common femoral vein has a moderate tapered narrowing, and there is another focus of moderate short segment narrowing in the right external iliac vein just inferior to the and iliac venous stent. Body wall collaterals draining around this level from the common femoral vein attest to the hemodynamic significance of these lesions. The iliac venous stent is widely patent, and native venous outflow more centrally through the IVC is widely patent.  The outflow vein  stenoses responded moderately well to 12 mm balloon angioplasty but follow-up venography demonstrated significant recoil resulting greater than 50% diameter stenosis. No extravasation or dissection. Additional angioplasty with a 14 mm balloon resulted in improved post angiographic appearance with minimal recoil, decreased filling of collateral venous channels, no extravasation or stenosis.  IMPRESSION: 1. 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  ACCESS: Remains approachable for percutaneous intervention as needed.   Electronically Signed   By: Lucrezia Europe M.D.   On: 12/02/2014 11:09    Scheduled Meds: . aspirin  325 mg Oral Daily  . benzonatate  100 mg Oral TID  . calcium acetate  667 mg Oral BID WC  . clonazePAM  0.5 mg Oral BID  .  Darbepoetin Alfa  40 mcg Intravenous Q Thu-HD  . doxercalciferol  3 mcg Intravenous Q T,Th,Sa-HD  . doxycycline  100 mg Oral Q12H  . enoxaparin (LOVENOX) injection  30 mg Subcutaneous Q24H  . gabapentin  100 mg Oral BID  . insulin aspart  0-15 Units Subcutaneous TID WC  . insulin aspart  0-5 Units Subcutaneous QHS  . insulin aspart  7 Units Subcutaneous TID WC  . insulin glargine  7 Units Subcutaneous BID  . ipratropium-albuterol  3 mL Nebulization TID  . metoCLOPramide  5 mg Oral TID AC & HS  . metoprolol succinate  50 mg Oral Daily  . montelukast  10 mg Oral QHS  . multivitamin  1 tablet Oral QHS  . pantoprazole  40 mg Oral QHS  . simvastatin  20 mg Oral QHS   Continuous Infusions: . sodium chloride    . sodium chloride 1,000 mL (12/01/14 1401)    Principal Problem:   DKA (diabetic ketoacidoses) Active Problems:   Hyperlipidemia   GERD (gastroesophageal reflux disease)   RLS (restless legs syndrome)   History of simultaneous kidney and pancreas transplant   Gastroparesis   End-stage renal disease on hemodialysis   Recent Left pontine CVA   Hyperkalemia   HTN (hypertension)   Acute bronchitis   Asthma   Bleeding from dialysis shunt    Time spent: 20 min    Kelvin Cellar  [facility] Pager [phone]. If 7PM-7AM, please contact night-coverage at [email or web address], password [facility] 12/03/2014, 3:51 PM  LOS: 2 days

## 2014-12-04 LAB — RENAL FUNCTION PANEL
Albumin: 2.7 g/dL — ABNORMAL LOW (ref 3.5–5.0)
Anion gap: 11 (ref 5–15)
BUN: 21 mg/dL — ABNORMAL HIGH (ref 6–20)
CALCIUM: 7.9 mg/dL — AB (ref 8.9–10.3)
CO2: 25 mmol/L (ref 22–32)
Chloride: 98 mmol/L — ABNORMAL LOW (ref 101–111)
Creatinine, Ser: 8.14 mg/dL — ABNORMAL HIGH (ref 0.44–1.00)
GFR calc Af Amer: 6 mL/min — ABNORMAL LOW (ref >60)
GFR calc non Af Amer: 5 mL/min — ABNORMAL LOW (ref >60)
GLUCOSE: 208 mg/dL — AB (ref 65–99)
Phosphorus: 3.5 mg/dL (ref 2.5–4.6)
Potassium: 4.1 mmol/L (ref 3.5–5.1)
Sodium: 134 mmol/L — ABNORMAL LOW (ref 135–145)

## 2014-12-04 LAB — GLUCOSE, CAPILLARY
GLUCOSE-CAPILLARY: 183 mg/dL — AB (ref 65–99)
Glucose-Capillary: 31 mg/dL — CL (ref 65–99)
Glucose-Capillary: 47 mg/dL — ABNORMAL LOW (ref 65–99)
Glucose-Capillary: 130 mg/dL — ABNORMAL HIGH (ref 65–99)
Glucose-Capillary: 270 mg/dL — ABNORMAL HIGH (ref 65–99)

## 2014-12-04 LAB — CBC
HCT: 31 % — ABNORMAL LOW (ref 36.0–46.0)
HEMOGLOBIN: 9.6 g/dL — AB (ref 12.0–15.0)
MCH: 30.2 pg (ref 26.0–34.0)
MCHC: 31 g/dL (ref 30.0–36.0)
MCV: 97.5 fL (ref 78.0–100.0)
Platelets: 139 10*3/uL — ABNORMAL LOW (ref 150–400)
RBC: 3.18 MIL/uL — ABNORMAL LOW (ref 3.87–5.11)
RDW: 15.7 % — ABNORMAL HIGH (ref 11.5–15.5)
WBC: 5.1 10*3/uL (ref 4.0–10.5)

## 2014-12-04 MED ORDER — IPRATROPIUM-ALBUTEROL 0.5-2.5 (3) MG/3ML IN SOLN: 3.0000 mL | RESPIRATORY_TRACT | Status: DC | PRN

## 2014-12-04 MED ORDER — DEXTROSE 50 % IV SOLN
INTRAVENOUS | Status: AC
  Administered 2014-12-04: 50 mL
  Filled 2014-12-04: qty 50

## 2014-12-04 MED ORDER — INSULIN ASPART 100 UNIT/ML ~~LOC~~ SOLN
0.0000 [IU] | Freq: Three times a day (TID) | SUBCUTANEOUS | Status: DC
  Administered 2014-12-04: 1 [IU] via SUBCUTANEOUS
  Administered 2014-12-05: 5 [IU] via SUBCUTANEOUS
  Administered 2014-12-05: 2 [IU] via SUBCUTANEOUS
  Administered 2014-12-06: 7 [IU] via SUBCUTANEOUS

## 2014-12-04 MED ORDER — INSULIN GLARGINE 100 UNIT/ML ~~LOC~~ SOLN
5.0000 [IU] | Freq: Two times a day (BID) | SUBCUTANEOUS | Status: DC
  Administered 2014-12-04: 5 [IU] via SUBCUTANEOUS
  Filled 2014-12-04 (×3): qty 0.05

## 2014-12-04 MED ORDER — DOXERCALCIFEROL 4 MCG/2ML IV SOLN
INTRAVENOUS | Status: AC
  Filled 2014-12-04: qty 2

## 2014-12-04 NOTE — Progress Notes (Signed)
Hypoglycemic Event  CBG: 47  Treatment: 15 GM carbohydrate snack  Symptoms: Sweaty Shaky  Follow-up CBG: Time:1555 CBG Result: 31  Treatment: amp D5  Follow-up CBG Result:  Possible Reasons for Event: Medication Regimen  Comments/MD notified: Dr. Coralyn Pear notified    Tyna Jaksch N  Remember to initiate Hypoglycemia Order Set & complete

## 2014-12-04 NOTE — Progress Notes (Signed)
[facility] Progress Note   Subjective: nausea a little better  Filed Vitals:   12/04/14 0724 12/04/14 0800 12/04/14 0830 12/04/14 0900  BP: 96/40 132/68 118/66 120/60  Pulse: 60 86 83 84  Temp:      TempSrc:      Resp:      Height:      Weight:      SpO2:       Exam: Alert, no distress No jvd Chest some basilar crackles bilat RRR no MRG ABd soft, ntnd no ascites No LE edema R thigh AVG patent Neuro is ox 3  HD: TTS NW 3hr 45 mins 68kgs 2K/2.5ca R thigh AVG No heparin Hectorol 3 Micera 150 q 2 - last dose 6/30      Assessment: 1. DKA- per primary 2. Acute bronchitis - getting doxy po bid 3. ESRD TTS HD 4. HTN on MTP, BPs good 5. Volume 2kg up, euvolemic 6. Anemia cont meds 7. MBD cont meds 8. 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   Plan - HD today, UF to dry wt    Kelly Splinter MD  pager [phone]    cell [phone]  12/04/2014, 10:05 AM     Recent Labs Lab 12/03/14 0500 12/03/14 1557 12/04/14 0754  NA 134* 137 134*  K 3.4* 4.2 4.1  CL 97* 98* 98*  CO2 27 26 25   GLUCOSE 177* 205* 208*  BUN 12 17 21*  CREATININE 5.07* 6.56* 8.14*  CALCIUM 7.4* 8.1* 7.9*  PHOS  --  2.4* 3.5    Recent Labs Lab 12/01/14 1017 12/03/14 1557 12/04/14 0754  AST 64*  --   --   ALT QUANTITY NOT SUFFICIENT, UNABLE TO PERFORM TEST  --   --   ALKPHOS 134*  --   --   BILITOT QUANTITY NOT SUFFICIENT, UNABLE TO PERFORM TEST  --   --   PROT 7.6  --   --   ALBUMIN 3.5 3.0* 2.7*    Recent Labs Lab 12/01/14 1017 12/03/14 0727 12/04/14 0755  WBC 8.9 4.8 5.1  NEUTROABS 7.5  --   --   HGB 10.8* 9.3* 9.6*  HCT 35.3* 29.1* 31.0*  MCV 103.5* 95.1 97.5  PLT 108* 120* 139*   . aspirin  325 mg Oral Daily  . benzonatate  100 mg Oral TID  . calcium acetate  667 mg Oral BID WC  . clonazePAM  0.5 mg Oral BID  . Darbepoetin Alfa  40 mcg Intravenous Q Thu-HD  . doxercalciferol  3 mcg Intravenous Q T,Th,Sa-HD  . doxycycline  100  mg Oral Q12H  . enoxaparin (LOVENOX) injection  30 mg Subcutaneous Q24H  . gabapentin  100 mg Oral BID  . insulin aspart  0-15 Units Subcutaneous TID WC  . insulin aspart  0-5 Units Subcutaneous QHS  . insulin aspart  7 Units Subcutaneous TID WC  . insulin glargine  7 Units Subcutaneous BID  . ipratropium-albuterol  3 mL Nebulization TID  . metoCLOPramide  5 mg Oral TID AC & HS  . metoprolol succinate  50 mg Oral Daily  . montelukast  10 mg Oral QHS  . multivitamin  1 tablet Oral QHS  . pantoprazole  40 mg Oral QHS  . simvastatin  20 mg Oral QHS  . sodium chloride  10-40 mL Intracatheter Q12H   . sodium chloride    . sodium chloride 1,000 mL (12/03/14 2253)   sodium chloride, sodium chloride, acetaminophen,  feeding supplement (NEPRO CARB STEADY), heparin, lidocaine (PF), lidocaine-prilocaine, ondansetron (ZOFRAN) IV, oxyCODONE, pentafluoroprop-tetrafluoroeth, promethazine, sodium chloride, zolpidem

## 2014-12-04 NOTE — Progress Notes (Signed)
[facility] PROGRESS NOTE  [name] SAY:[id number] DOB: April 28, 1971 DOA: 12/01/2014 PCP: [name], MD  Assessment/Plan: 1. Diabetic ketoacidosis -Patient with history of insulin-dependent diabetes mellitus, having multiple previous hospitalizations for diabetic ketoacidosis, presented with an anion gap of 24 having blood sugar of 910 and bicarbonate of 14. -Placed on DKA protocol receiving IV fluids and IV insulin. -Her INR gap closed overnight, will discontinue IV insulin, advance her diet, transition to basal insulin with Lantus 7 units subcutaneous twice a day. Will provide 7 units of NovoLog for meal time coverage -Blood sugars stable  2. End-stage renal disease, on hemodialysis -Patient undergoing hemodialysis on Tuesdays Thursdays and Saturdays  3. Possible acute bacterial bronchitis -Patient reporting cough with shortness of breath. -Initial workup included a chest x-ray which did not reveal evidence of infiltrate -She remains afebrile -White count within normal limits -Continue Doxy   4. Hyperkalemia -Likely secondary to diabetic ketoacidosis with potassium normalizing this morning. Labs showed potassium of 3.9  5. Hyponatremia -Likely a consequence of DKA/dehydration, initial labs showing sodium of 124 -Labs showing a sodium of 134  6. Hypertension -Because of low blood pressures I decreased her metoprolol to 50 mg by mouth daily from 100 mg  7. Dyslipidemia 8. History of left pontine CVA  Code Status: Full Code Family Communication: Family not present Disposition Plan: Anticipate discharge home the next 24 hours if she remains stable   Consultants:  Nephrology  Procedures:  HD  Antibiotics:  Doxy  HPI/Subjective: Patient is a 44 year old female with a past medical history of end-stage renal disease, insulin-dependent diabetes mellitus, having multiple prior hospitalizations for diabetic ketoacidosis, admitted to the medicine  service on 12/01/2014 presented with complaints of malaise, feeling irritable, reporting elevated blood sugars. She had reported nonfunctioning glucometer. Initial lab work performed in the emergency room showed glucose of 1000, NI gap of 28 with bicarbonate of 11. Patient in diabetic ketoacidosis. She was started on IV insulin, IV fluids, admitted to stepdown unit and placed on DKA protocol.  She complains of nausea and vomiting this morning. She is being seen in HD. Will watch her over the course of the day to ensure she remains stable and is tolerating PO intake.    Objective: Filed Vitals:   12/04/14 0830  BP: 118/66  Pulse: 83  Temp:   Resp:     Intake/Output Summary (Last 24 hours) at 12/04/14 0854 Last data filed at 12/04/14 0700  Gross per 24 hour  Intake 1231.17 ml  Output      0 ml  Net 1231.17 ml   Filed Weights   12/02/14 1712 12/02/14 2121 12/04/14 0706  Weight: 69 kg (152 lb 1.9 oz) 67.3 kg (148 lb 5.9 oz) 69.9 kg (154 lb 1.6 oz)    Exam:   General:  Patient complaining of nausea  Cardiovascular: Regular rate and rhythm normal S1-S2  Respiratory: Normal respiratory effort, overall lungs clear to auscultation bilaterally  Abdomen: Soft nontender nondistended  Musculoskeletal: No edema  Data Reviewed: Basic Metabolic Panel:  Recent Labs Lab 12/02/14 0345 12/02/14 1102 12/03/14 0500 12/03/14 1557 12/04/14 0754  NA 132* 133* 134* 137 134*  K 3.4* 3.9 3.4* 4.2 4.1  CL 93* 94* 97* 98* 98*  CO2 23 24 27 26 25   GLUCOSE 140* 212* 177* 205* 208*  BUN 47* 51* 12 17 21*  CREATININE 10.58* 11.28* 5.07* 6.56* 8.14*  CALCIUM 7.6* 7.7* 7.4* 8.1* 7.9*  PHOS  --   --   --  2.4* 3.5   Liver Function Tests:  Recent Labs Lab 12/01/14 1017 12/03/14 1557 12/04/14 0754  AST 64*  --   --   ALT QUANTITY NOT SUFFICIENT, UNABLE TO PERFORM TEST  --   --   ALKPHOS 134*  --   --   BILITOT QUANTITY NOT SUFFICIENT, UNABLE TO PERFORM TEST  --   --   PROT 7.6  --   --    ALBUMIN 3.5 3.0* 2.7*   No results for input(s): LIPASE, AMYLASE in the last 168 hours. No results for input(s): AMMONIA in the last 168 hours. CBC:  Recent Labs Lab 12/01/14 1017 12/03/14 0727 12/04/14 0755  WBC 8.9 4.8 5.1  NEUTROABS 7.5  --   --   HGB 10.8* 9.3* 9.6*  HCT 35.3* 29.1* 31.0*  MCV 103.5* 95.1 97.5  PLT 108* 120* 139*   Cardiac Enzymes: No results for input(s): CKTOTAL, CKMB, CKMBINDEX, TROPONINI in the last 168 hours. BNP (last 3 results) No results for input(s): BNP in the last 8760 hours.  ProBNP (last 3 results)  Recent Labs  01/14/14 0216  PROBNP 17541.0*    CBG:  Recent Labs Lab 12/03/14 1018 12/03/14 1131 12/03/14 1643 12/03/14 2000 12/03/14 2109  GLUCAP 188* 197* 159* 79 93    Recent Results (from the past 240 hour(s))  Culture, blood (routine x 2)     Status: None (Preliminary result)   Collection Time: 12/01/14 10:17 AM  Result Value Ref Range Status   Specimen Description BLOOD LEFT THUMB  Final   Special Requests BOTTLES DRAWN AEROBIC ONLY 2CC  Final   Culture NO GROWTH 2 DAYS  Final   Report Status PENDING  Incomplete  Culture, blood (routine x 2)     Status: None (Preliminary result)   Collection Time: 12/01/14 10:43 AM  Result Value Ref Range Status   Specimen Description BLOOD LEFT ANTECUBITAL  Final   Special Requests BOTTLES DRAWN AEROBIC AND ANAEROBIC 5CC  Final   Culture NO GROWTH 2 DAYS  Final   Report Status PENDING  Incomplete  MRSA PCR Screening     Status: Abnormal   Collection Time: 12/01/14  2:50 PM  Result Value Ref Range Status   MRSA by PCR INVALID RESULTS, SPECIMEN SENT FOR CULTURE (A) NEGATIVE Final    Comment: CALLED TO C.WOODARD,RN BY L.PITT 12/01/14        The GeneXpert MRSA Assay (FDA approved for NASAL specimens only), is one component of a comprehensive MRSA colonization surveillance program. It is not intended to diagnose MRSA infection nor to guide or monitor treatment for MRSA  infections.   MRSA culture     Status: None   Collection Time: 12/01/14  2:50 PM  Result Value Ref Range Status   Specimen Description NASOPHARYNGEAL  Final   Special Requests NONE  Final   Culture NOMRSA Performed at [facility]   Final   Report Status 12/03/2014 FINAL  Final     Studies: Ir Pta Venous Right  12/02/2014   CLINICAL DATA:  End stage renal disease, prolonged bleeding after hemodialysis sections  FLUOROSCOPY TIME:  1 minutes 6 seconds, and 237 mGy  COMPARISON:  04/16/2014  EXAM: DIALYSIS SHUNTOGRAM  VENOUS ANGIOPLASTY  TECHNIQUE: The procedure, risks (including but not limited to bleeding, infection, organ damage ), benefits, and alternatives were explained to the patient. Questions regarding the procedure were encouraged and answered. The patient understands and consents to the procedure.  An 18-gauge angiocatheter was placed antegrade into  the arterial limb of the patient's right thigh synthetic loopAV hemodialysis fistula for dialysis fistulography. The angiocatheter and surrounding skin were then prepped with Betadine, draped in usual sterile fashion, infiltrated locally with 1% lidocaine.  Intravenous Fentanyl and Versed were administered as conscious sedation during continuous cardiorespiratory monitoring by the radiology RN, with a total moderate sedation time of 10 minutes.  The angiocatheter was exchanged over a Benson wire for a 6 [place] vascular sheath, through which a 12 mm x 4 cm atlas angioplasty balloon was advanced to the level of a venous outflowstenosis for venous angioplasty using 60 second overlapping inflations at 18 atmospheres. After follow-up venography, additional PTA was performed with a 14 mm x 4 cm atlas balloon. After final venography, the catheter, sheath, and guidewire were removed and hemostasis achieved with a 2-0 Ethilon purse-string suture. No immediate complication.  FINDINGS: Arterial anastomosis is widely patent. There is mild irregularity in  the venous limb and apex of the graft without significant intragraft stenosis. The venous anastomosis is patent. The outflow common femoral vein has a moderate tapered narrowing, and there is another focus of moderate short segment narrowing in the right external iliac vein just inferior to the and iliac venous stent. Body wall collaterals draining around this level from the common femoral vein attest to the hemodynamic significance of these lesions. The iliac venous stent is widely patent, and native venous outflow more centrally through the IVC is widely patent.  The outflow vein stenoses responded moderately well to 12 mm balloon angioplasty but follow-up venography demonstrated significant recoil resulting greater than 50% diameter stenosis. No extravasation or dissection. Additional angioplasty with a 14 mm balloon resulted in improved post angiographic appearance with minimal recoil, decreased filling of collateral venous channels, no extravasation or stenosis.  IMPRESSION: 1. 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  ACCESS: Remains approachable for percutaneous intervention as needed.   Electronically Signed   By: Lucrezia Europe M.D.   On: 12/02/2014 11:09   Ir Shuntogram/ Fistulagram Right Mod Sed  12/02/2014   CLINICAL DATA:  End stage renal disease, prolonged bleeding after hemodialysis sections  FLUOROSCOPY TIME:  1 minutes 6 seconds, and 237 mGy  COMPARISON:  04/16/2014  EXAM: DIALYSIS SHUNTOGRAM  VENOUS ANGIOPLASTY  TECHNIQUE: The procedure, risks (including but not limited to bleeding, infection, organ damage ), benefits, and alternatives were explained to the patient. Questions regarding the procedure were encouraged and answered. The patient understands and consents to the procedure.  An 18-gauge angiocatheter was placed antegrade into the arterial limb of the patient's right thigh synthetic loopAV hemodialysis fistula for dialysis fistulography. The angiocatheter  and surrounding skin were then prepped with Betadine, draped in usual sterile fashion, infiltrated locally with 1% lidocaine.  Intravenous Fentanyl and Versed were administered as conscious sedation during continuous cardiorespiratory monitoring by the radiology RN, with a total moderate sedation time of 10 minutes.  The angiocatheter was exchanged over a Benson wire for a 6 [place] vascular sheath, through which a 12 mm x 4 cm atlas angioplasty balloon was advanced to the level of a venous outflowstenosis for venous angioplasty using 60 second overlapping inflations at 18 atmospheres. After follow-up venography, additional PTA was performed with a 14 mm x 4 cm atlas balloon. After final venography, the catheter, sheath, and guidewire were removed and hemostasis achieved with a 2-0 Ethilon purse-string suture. No immediate complication.  FINDINGS: Arterial anastomosis is widely patent. There is mild irregularity in the venous limb and  apex of the graft without significant intragraft stenosis. The venous anastomosis is patent. The outflow common femoral vein has a moderate tapered narrowing, and there is another focus of moderate short segment narrowing in the right external iliac vein just inferior to the and iliac venous stent. Body wall collaterals draining around this level from the common femoral vein attest to the hemodynamic significance of these lesions. The iliac venous stent is widely patent, and native venous outflow more centrally through the IVC is widely patent.  The outflow vein stenoses responded moderately well to 12 mm balloon angioplasty but follow-up venography demonstrated significant recoil resulting greater than 50% diameter stenosis. No extravasation or dissection. Additional angioplasty with a 14 mm balloon resulted in improved post angiographic appearance with minimal recoil, decreased filling of collateral venous channels, no extravasation or stenosis.  IMPRESSION: 1. Tandem outflow vein  stenoses, with good response to 14 mm balloon angioplasty.  ACCESS: Remains approachable for percutaneous intervention as needed.   Electronically Signed   By: Lucrezia Europe M.D.   On: 12/02/2014 11:09    Scheduled Meds: . aspirin  325 mg Oral Daily  . benzonatate  100 mg Oral TID  . calcium acetate  667 mg Oral BID WC  . clonazePAM  0.5 mg Oral BID  . Darbepoetin Alfa  40 mcg Intravenous Q Thu-HD  . doxercalciferol  3 mcg Intravenous Q T,Th,Sa-HD  . doxycycline  100 mg Oral Q12H  . enoxaparin (LOVENOX) injection  30 mg Subcutaneous Q24H  . gabapentin  100 mg Oral BID  . insulin aspart  0-15 Units Subcutaneous TID WC  . insulin aspart  0-5 Units Subcutaneous QHS  . insulin aspart  7 Units Subcutaneous TID WC  . insulin glargine  7 Units Subcutaneous BID  . ipratropium-albuterol  3 mL Nebulization TID  . metoCLOPramide  5 mg Oral TID AC & HS  . metoprolol succinate  50 mg Oral Daily  . montelukast  10 mg Oral QHS  . multivitamin  1 tablet Oral QHS  . pantoprazole  40 mg Oral QHS  . simvastatin  20 mg Oral QHS  . sodium chloride  10-40 mL Intracatheter Q12H   Continuous Infusions: . sodium chloride    . sodium chloride 1,000 mL (12/03/14 2253)    Principal Problem:   DKA (diabetic ketoacidoses) Active Problems:   Hyperlipidemia   GERD (gastroesophageal reflux disease)   RLS (restless legs syndrome)   History of simultaneous kidney and pancreas transplant   Gastroparesis   End-stage renal disease on hemodialysis   Recent Left pontine CVA   Hyperkalemia   HTN (hypertension)   Acute bronchitis   Asthma   Bleeding from dialysis shunt    Time spent: 20 min    Kelvin Cellar  [facility] Pager [phone]. If 7PM-7AM, please contact night-coverage at [email or web address], password [facility] 12/04/2014, 8:54 AM  LOS: 3 days

## 2014-12-05 DIAGNOSIS — K3184 Gastroparesis: Secondary | ICD-10-CM

## 2014-12-05 DIAGNOSIS — E162 Hypoglycemia, unspecified: Secondary | ICD-10-CM

## 2014-12-05 LAB — GLUCOSE, CAPILLARY
GLUCOSE-CAPILLARY: 125 mg/dL — AB (ref 65–99)
GLUCOSE-CAPILLARY: 274 mg/dL — AB (ref 65–99)
Glucose-Capillary: 114 mg/dL — ABNORMAL HIGH (ref 65–99)
Glucose-Capillary: 39 mg/dL — CL (ref 65–99)
Glucose-Capillary: 173 mg/dL — ABNORMAL HIGH (ref 65–99)
Glucose-Capillary: 89 mg/dL (ref 65–99)

## 2014-12-05 MED ORDER — DEXTROSE 50 % IV SOLN
INTRAVENOUS | Status: AC
  Administered 2014-12-05: 25 mL
  Filled 2014-12-05: qty 50

## 2014-12-05 MED ORDER — INSULIN GLARGINE 100 UNIT/ML ~~LOC~~ SOLN
5.0000 [IU] | Freq: Every day | SUBCUTANEOUS | Status: DC
  Administered 2014-12-05: 5 [IU] via SUBCUTANEOUS
  Filled 2014-12-05 (×2): qty 0.05

## 2014-12-05 MED ORDER — METOCLOPRAMIDE HCL 10 MG PO TABS
10.0000 mg | ORAL_TABLET | Freq: Three times a day (TID) | ORAL | Status: DC
  Administered 2014-12-05 – 2014-12-06 (×5): 10 mg via ORAL
  Filled 2014-12-05 (×6): qty 1

## 2014-12-05 NOTE — Progress Notes (Signed)
Subjective:   Still having a lot of nausea. Hypoglycemic this AM  Objective Filed Vitals:   12/04/14 1113 12/04/14 1700 12/04/14 2041 12/05/14 0537  BP: 104/59 115/57 120/53 147/76  Pulse: 84 81 80 80  Temp: 98.6 F (37 C) 98.3 F (36.8 C) 99.3 F (37.4 C) 98 F (36.7 C)  TempSrc: Oral Oral Oral Oral  Resp: 15 20 18 16   Height:      Weight:      SpO2: 96% 97% 99% 96%   Physical Exam General: alert and oriented, no acute distress. Sitting up eating breakfast Heart: RRR  Lungs: CTA, unlabored  Abdomen: soft, nontender +BS  Extremities: no edema Dialysis Access:  R thigh AVG +b/t  HD: TTS NW 3hr 45 mins 68kgs 2K/2.5ca R thigh AVG No heparin Hectorol 3 Micera 150 q 2 - last dose 6/30  Assessment/Plan: 1. DKA- per primary/ episodes of hypoglycemia 2. Acute bronchitis- cont doxycycline 2. ESRD - TTS NW, next HD Tuesday- K+ 4.1 yesterday 3. Anemia - hgb 9.6- Aranesp given thursday 4. Secondary hyperparathyroidism - phos 3.5 cont hectorol and phoslo 5. HTN/volume - BP controlled. 1.9 kgs over edw PRE hd yesterday. metop- dose decreased this admission 6. Nutrition - alb 2.7 renal carb diet. vitamin  Shelle Iron, NP [place] [phone] 12/05/2014,8:53 AM  LOS: 4 days   Pt seen, examined and agree w A/P as above.  Kelly Splinter MD pager [phone]    cell [phone] 12/05/2014, 4:56 PM    Additional Objective Labs: Basic Metabolic Panel:  Recent Labs Lab 12/03/14 0500 12/03/14 1557 12/04/14 0754  NA 134* 137 134*  K 3.4* 4.2 4.1  CL 97* 98* 98*  CO2 27 26 25   GLUCOSE 177* 205* 208*  BUN 12 17 21*  CREATININE 5.07* 6.56* 8.14*  CALCIUM 7.4* 8.1* 7.9*  PHOS  --  2.4* 3.5   Liver Function Tests:  Recent Labs Lab 12/01/14 1017 12/03/14 1557 12/04/14 0754  AST 64*  --   --   ALT QUANTITY NOT SUFFICIENT, UNABLE TO PERFORM TEST  --   --   ALKPHOS 134*  --   --   BILITOT QUANTITY NOT SUFFICIENT, UNABLE TO PERFORM TEST  --    --   PROT 7.6  --   --   ALBUMIN 3.5 3.0* 2.7*   No results for input(s): LIPASE, AMYLASE in the last 168 hours. CBC:  Recent Labs Lab 12/01/14 1017 12/03/14 0727 12/04/14 0755  WBC 8.9 4.8 5.1  NEUTROABS 7.5  --   --   HGB 10.8* 9.3* 9.6*  HCT 35.3* 29.1* 31.0*  MCV 103.5* 95.1 97.5  PLT 108* 120* 139*   Blood Culture    Component Value Date/Time   SDES NASOPHARYNGEAL 12/01/2014 1450   SPECREQUEST NONE 12/01/2014 1450   CULT NOMRSA Performed at [facility]  12/01/2014 1450   REPTSTATUS 12/03/2014 FINAL 12/01/2014 1450    Cardiac Enzymes: No results for input(s): CKTOTAL, CKMB, CKMBINDEX, TROPONINI in the last 168 hours. CBG:  Recent Labs Lab 12/04/14 1705 12/04/14 2207 12/05/14 0732 12/05/14 0735 12/05/14 0828  GLUCAP 130* 270* 34* 39* 125*   Iron Studies: No results for input(s): IRON, TIBC, TRANSFERRIN, FERRITIN in the last 72 hours. @lablastinr3 @ Studies/Results: No results found. Medications: . sodium chloride    . sodium chloride 1,000 mL (12/03/14 2253)   . aspirin  325 mg Oral Daily  . benzonatate  100 mg Oral TID  . calcium acetate  667 mg Oral  BID WC  . clonazePAM  0.5 mg Oral BID  . Darbepoetin Alfa  40 mcg Intravenous Q Thu-HD  . doxercalciferol  3 mcg Intravenous Q T,Th,Sa-HD  . doxycycline  100 mg Oral Q12H  . enoxaparin (LOVENOX) injection  30 mg Subcutaneous Q24H  . gabapentin  100 mg Oral BID  . insulin aspart  0-9 Units Subcutaneous TID WC  . insulin glargine  5 Units Subcutaneous Daily  . metoCLOPramide  10 mg Oral TID AC & HS  . metoprolol succinate  50 mg Oral Daily  . montelukast  10 mg Oral QHS  . multivitamin  1 tablet Oral QHS  . pantoprazole  40 mg Oral QHS  . simvastatin  20 mg Oral QHS  . sodium chloride  10-40 mL Intracatheter Q12H

## 2014-12-05 NOTE — Progress Notes (Signed)
Hypoglycemic Event  CBG: 39  Treatment: D50 IV 25 mL  Symptoms: None  Follow-up CBG: Time: 0750 CBG Result: 125  Possible Reasons for Event: Unknown  Comments/MD notified:Zamora    Tyna Jaksch N  Remember to initiate Hypoglycemia Order Set & complete

## 2014-12-05 NOTE — Progress Notes (Signed)
[facility] PROGRESS NOTE  [name] LTJ:[id number] DOB: 1971-05-18 DOA: 12/01/2014 PCP: [name] Gene, MD  Assessment/Plan: 1. Diabetic ketoacidosis -Patient with history of insulin-dependent diabetes mellitus, having multiple previous hospitalizations for diabetic ketoacidosis, presented with an anion gap of 24 having blood sugar of 910 and bicarbonate of 14. -Placed on DKA protocol receiving IV fluids and IV insulin. -Her INR gap closed overnight, will discontinue IV insulin, advance her diet, transition to basal insulin with Lantus 7 units subcutaneous twice a day. Will provide 7 units of NovoLog for meal time coverage -Having hypoglycemia this morning with blood sugar of 38. I think this may be related to decreased PO intake from her nausea/vomiting -Will decrease Lantus from 5 units BID to 5 units daily.   2. Nausea/Vomiting -Continues to have some n/v this morning that could be related diabetic gastroparesis exacerbation. Abdomen was nontender on exam and is having regular bowel movements. I had increased her reglan to 10 mg. Will monitor response.   3.  Endstage renal disease, on hemodialysis -Patient undergoing hemodialysis on Tuesdays Thursdays and Saturdays  4.  Possible acute bacterial bronchitis -Patient reporting cough with shortness of breath. -Initial workup included a chest x-ray which did not reveal evidence of infiltrate -She remains afebrile -White count within normal limits -Will finish off a 1 week course of Doxy  5.  Hyperkalemia -Likely secondary to diabetic ketoacidosis with potassium normalizing this morning. Labs showed potassium of 3.9  6.  Hyponatremia -Likely a consequence of DKA/dehydration, initial labs showing sodium of 124 -Labs showing a sodium of 134  7.  Hypertension -Continue metoprolol to 50 mg by mouth daily    Code Status: Full Code Family Communication: Family not present Disposition Plan: Anticipate discharge home  the next 24 hours if she remains stable   Consultants:  Nephrology  Procedures:  HD  Antibiotics:  Doxy  HPI/Subjective: Patient is a 44 year old female with a past medical history of end-stage renal disease, insulin-dependent diabetes mellitus, having multiple prior hospitalizations for diabetic ketoacidosis, admitted to the medicine service on 12/01/2014 presented with complaints of malaise, feeling irritable, reporting elevated blood sugars. She had reported nonfunctioning glucometer. Initial lab work performed in the emergency room showed glucose of 1000, NI gap of 28 with bicarbonate of 11. Patient in diabetic ketoacidosis. She was started on IV insulin, IV fluids, admitted to stepdown unit and placed on DKA protocol.  She complains of nausea and vomiting this morning. She is being seen in HD. Will watch her over the course of the day to ensure she remains stable and is tolerating PO intake.    Objective: Filed Vitals:   12/05/14 1000  BP: 147/73  Pulse: 78  Temp: 98 F (36.7 C)  Resp: 20    Intake/Output Summary (Last 24 hours) at 12/05/14 1740 Last data filed at 12/05/14 0600  Gross per 24 hour  Intake    240 ml  Output      0 ml  Net    240 ml   Filed Weights   12/02/14 1712 12/02/14 2121 12/04/14 0706  Weight: 69 kg (152 lb 1.9 oz) 67.3 kg (148 lb 5.9 oz) 69.9 kg (154 lb 1.6 oz)    Exam:   General:  Patient complaining of nausea  Cardiovascular: Regular rate and rhythm normal S1-S2  Respiratory: Normal respiratory effort, overall lungs clear to auscultation bilaterally  Abdomen: Soft nontender nondistended  Musculoskeletal: No edema  Data Reviewed: Basic Metabolic Panel:  Recent Labs Lab 12/02/14 0345  12/02/14 1102 12/03/14 0500 12/03/14 1557 12/04/14 0754  NA 132* 133* 134* 137 134*  K 3.4* 3.9 3.4* 4.2 4.1  CL 93* 94* 97* 98* 98*  CO2 23 24 27 26 25   GLUCOSE 140* 212* 177* 205* 208*  BUN 47* 51* 12 17 21*  CREATININE 10.58* 11.28*  5.07* 6.56* 8.14*  CALCIUM 7.6* 7.7* 7.4* 8.1* 7.9*  PHOS  --   --   --  2.4* 3.5   Liver Function Tests:  Recent Labs Lab 12/01/14 1017 12/03/14 1557 12/04/14 0754  AST 64*  --   --   ALT QUANTITY NOT SUFFICIENT, UNABLE TO PERFORM TEST  --   --   ALKPHOS 134*  --   --   BILITOT QUANTITY NOT SUFFICIENT, UNABLE TO PERFORM TEST  --   --   PROT 7.6  --   --   ALBUMIN 3.5 3.0* 2.7*   No results for input(s): LIPASE, AMYLASE in the last 168 hours. No results for input(s): AMMONIA in the last 168 hours. CBC:  Recent Labs Lab 12/01/14 1017 12/03/14 0727 12/04/14 0755  WBC 8.9 4.8 5.1  NEUTROABS 7.5  --   --   HGB 10.8* 9.3* 9.6*  HCT 35.3* 29.1* 31.0*  MCV 103.5* 95.1 97.5  PLT 108* 120* 139*   Cardiac Enzymes: No results for input(s): CKTOTAL, CKMB, CKMBINDEX, TROPONINI in the last 168 hours. BNP (last 3 results) No results for input(s): BNP in the last 8760 hours.  ProBNP (last 3 results)  Recent Labs  01/14/14 0216  PROBNP 17541.0*    CBG:  Recent Labs Lab 12/05/14 0732 12/05/14 0735 12/05/14 0828 12/05/14 1126 12/05/14 1622  GLUCAP 34* 39* 125* 274* 173*    Recent Results (from the past 240 hour(s))  Culture, blood (routine x 2)     Status: None (Preliminary result)   Collection Time: 12/01/14 10:17 AM  Result Value Ref Range Status   Specimen Description BLOOD LEFT THUMB  Final   Special Requests BOTTLES DRAWN AEROBIC ONLY 2CC  Final   Culture NO GROWTH 4 DAYS  Final   Report Status PENDING  Incomplete  Culture, blood (routine x 2)     Status: None (Preliminary result)   Collection Time: 12/01/14 10:43 AM  Result Value Ref Range Status   Specimen Description BLOOD LEFT ANTECUBITAL  Final   Special Requests BOTTLES DRAWN AEROBIC AND ANAEROBIC 5CC  Final   Culture NO GROWTH 4 DAYS  Final   Report Status PENDING  Incomplete  MRSA PCR Screening     Status: Abnormal   Collection Time: 12/01/14  2:50 PM  Result Value Ref Range Status   MRSA by PCR  INVALID RESULTS, SPECIMEN SENT FOR CULTURE (A) NEGATIVE Final    Comment: CALLED TO C.WOODARD,RN BY L.PITT 12/01/14        The GeneXpert MRSA Assay (FDA approved for NASAL specimens only), is one component of a comprehensive MRSA colonization surveillance program. It is not intended to diagnose MRSA infection nor to guide or monitor treatment for MRSA infections.   MRSA culture     Status: None   Collection Time: 12/01/14  2:50 PM  Result Value Ref Range Status   Specimen Description NASOPHARYNGEAL  Final   Special Requests NONE  Final   Culture NOMRSA Performed at [facility]   Final   Report Status 12/03/2014 FINAL  Final     Studies: No results found.  Scheduled Meds: . aspirin  325 mg Oral Daily  .  benzonatate  100 mg Oral TID  . calcium acetate  667 mg Oral BID WC  . clonazePAM  0.5 mg Oral BID  . Darbepoetin Alfa  40 mcg Intravenous Q Thu-HD  . doxercalciferol  3 mcg Intravenous Q T,Th,Sa-HD  . doxycycline  100 mg Oral Q12H  . enoxaparin (LOVENOX) injection  30 mg Subcutaneous Q24H  . gabapentin  100 mg Oral BID  . insulin aspart  0-9 Units Subcutaneous TID WC  . insulin glargine  5 Units Subcutaneous Daily  . metoCLOPramide  10 mg Oral TID AC & HS  . metoprolol succinate  50 mg Oral Daily  . montelukast  10 mg Oral QHS  . multivitamin  1 tablet Oral QHS  . pantoprazole  40 mg Oral QHS  . simvastatin  20 mg Oral QHS  . sodium chloride  10-40 mL Intracatheter Q12H   Continuous Infusions: . sodium chloride    . sodium chloride 1,000 mL (12/03/14 2253)    Principal Problem:   DKA (diabetic ketoacidoses) Active Problems:   Hyperlipidemia   GERD (gastroesophageal reflux disease)   RLS (restless legs syndrome)   History of simultaneous kidney and pancreas transplant   Gastroparesis   End-stage renal disease on hemodialysis   Recent Left pontine CVA   Hyperkalemia   HTN (hypertension)   Acute bronchitis   Asthma   Bleeding from dialysis  shunt    Time spent: 20 min    Kelvin Cellar  [facility] Pager [phone]. If 7PM-7AM, please contact night-coverage at [email or web address], password [facility] 12/05/2014, 5:40 PM  LOS: 4 days

## 2014-12-06 LAB — CULTURE, BLOOD (ROUTINE X 2)
CULTURE: NO GROWTH
Culture: NO GROWTH

## 2014-12-06 LAB — GLUCOSE, CAPILLARY
GLUCOSE-CAPILLARY: 124 mg/dL — AB (ref 65–99)
Glucose-Capillary: 323 mg/dL — ABNORMAL HIGH (ref 65–99)
Glucose-Capillary: 138 mg/dL — ABNORMAL HIGH (ref 65–99)

## 2014-12-06 MED ORDER — PROMETHAZINE HCL 25 MG PO TABS: 25.0000 mg | ORAL_TABLET | Freq: Four times a day (QID) | ORAL | Status: AC | PRN

## 2014-12-06 MED ORDER — INSULIN ASPART 100 UNIT/ML ~~LOC~~ SOLN: 6.0000 [IU] | Freq: Two times a day (BID) | SUBCUTANEOUS | Status: DC

## 2014-12-06 MED ORDER — INSULIN ASPART 100 UNIT/ML ~~LOC~~ SOLN
0.0000 [IU] | Freq: Three times a day (TID) | SUBCUTANEOUS | Status: DC
  Administered 2014-12-06: 2 [IU] via SUBCUTANEOUS

## 2014-12-06 MED ORDER — HEPARIN SOD (PORK) LOCK FLUSH 100 UNIT/ML IV SOLN
250.0000 [IU] | INTRAVENOUS | Status: AC | PRN
  Administered 2014-12-06: 250 [IU]

## 2014-12-06 MED ORDER — INSULIN GLARGINE 100 UNIT/ML ~~LOC~~ SOLN
7.0000 [IU] | Freq: Two times a day (BID) | SUBCUTANEOUS | Status: DC
  Administered 2014-12-06: 7 [IU] via SUBCUTANEOUS
  Filled 2014-12-06 (×2): qty 0.07

## 2014-12-06 MED ORDER — METOCLOPRAMIDE HCL 10 MG PO TABS: 10.0000 mg | ORAL_TABLET | Freq: Three times a day (TID) | ORAL | Status: AC

## 2014-12-06 MED ORDER — DOXYCYCLINE HYCLATE 100 MG PO TABS: 100.0000 mg | ORAL_TABLET | Freq: Every day | ORAL | Status: DC

## 2014-12-06 MED ORDER — LIDOCAINE 5 % EX PTCH: 1.0000 | MEDICATED_PATCH | CUTANEOUS | Status: AC

## 2014-12-06 NOTE — Discharge Summary (Signed)
Physician Discharge Summary  [name] SWF:[id number] DOB: 09-Jul-1970 DOA: 12/01/2014  PCP: [name] Gene, MD  Admit date: 12/01/2014 Discharge date: 12/06/2014  Time spent: 35 minutes  Recommendations for Outpatient Follow-up:  1. Please follow up on blood sugars she was treated for DKA during this hospitalization 2. She was given script for new glucometer on discharge   Discharge Diagnoses:  Principal Problem:   DKA (diabetic ketoacidoses) Active Problems:   Hyperlipidemia   GERD (gastroesophageal reflux disease)   RLS (restless legs syndrome)   History of simultaneous kidney and pancreas transplant   Gastroparesis   End-stage renal disease on hemodialysis   Recent Left pontine CVA   Hyperkalemia   HTN (hypertension)   Acute bronchitis   Asthma   Bleeding from dialysis shunt   Discharge Condition: Stable  Diet recommendation: Carb Modified  Filed Weights   12/02/14 1712 12/02/14 2121 12/04/14 0706  Weight: 69 kg (152 lb 1.9 oz) 67.3 kg (148 lb 5.9 oz) 69.9 kg (154 lb 1.6 oz)    History of present illness:  This is a 44 year old female with known history of diabetes on insulin with recurrent admissions for DKA, retention, dyslipidemia, 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 Patient was recently discharged 6/21 after an admission for DKA. Chest has underlying diabetic gastroparesis and underwent a barium swallow last admission and this revealed mild esophageal dysmotility. She presents today complaints of elevated blood glucose, cough and generalized body aches. She reports beginning of symptoms about 3 days ago with generalized malaise, fever and chills, yellow productive cough with audible wheezing; and emesis after hemodialysis. She reports that her glucometer  machine has been broken for the past 24 hours and she has been unable to check her CBGs although she reports she took her usual doses of insulin. She reports that she did attend hemodialysis yesterday and completed a full treatment without incident other than the emesis.  In the ER patient had low-grade temperature of 99 1, respiratory rate was 29, BP was 169/72, was rate was 94. Oxygen saturations were between 92 and 95%. On room air. Her initial CBG was greater than 600 with a CO2 of 11 and an anion gap of 28 and her serum glucose was 1000. Her lactic acid was elevated at 3.35. She did not have a white count. Hemoglobin was slightly elevated for her at 10.8. BUN was 38 and creatinine 8.96. She was volume resuscitated with a total of 3 L of IV fluid and started on insulin drip. She is a very difficult IV stick and since arrival to the ER she has had 2 peripheral IV lines placed via ultrasound by the emergency room physician. Initial chest x-ray revealed no focal infiltrates but did revealed mild pulmonary interstitial edema that had improved since previous chest x-ray on 11/10/2014. EKG revealed sinus rhythm with a PR interval of 0.24, QTC 518 ms otherwise no acute ischemic changes. Her potassium was 6.5 and sodium was 122. Because she was recently hospitalized blood cultures were obtained given her elevated lactic acid but otherwise she was hemodynamically stable did not have other clinical signs consistent with sepsis physiology.  Hospital Course:  Patient is a 44 year old female with a past medical history of end-stage renal disease, insulin-dependent diabetes mellitus, having multiple prior hospitalizations for diabetic ketoacidosis, admitted to the medicine service on 12/01/2014 presented  with complaints of malaise, feeling irritable, reporting elevated blood sugars. She had reported nonfunctioning glucometer. Initial lab work performed in the emergency room showed glucose of 1000, NI gap of 28 with  bicarbonate of 11. Patient in diabetic ketoacidosis. She was started on IV insulin, IV fluids, admitted to stepdown unit and placed on DKA protocol.   Diabetic ketoacidosis -Patient with history of insulin-dependent diabetes mellitus, having multiple previous hospitalizations for diabetic ketoacidosis, presented with an anion gap of 24 having blood sugar of 910 and bicarbonate of 14. -Placed on DKA protocol receiving IV fluids and IV insulin. -Her INR gap closed overnight, was transitioned to Lantus 7 units subcutaneous twice a day.  -On 12/05/2014 patient having hypoglycemia blood sugar of 38 likely secondary to N/V. -Hypoglycemia resolved  2. Nausea/Vomiting -Likely related diabetic gastroparesis exacerbation. Abdomen was nontender on exam and is having regular bowel movements. I had increased her reglan to 10 mg.    3. Endstage renal disease, on hemodialysis -Patient undergoing hemodialysis on Tuesdays Thursdays and Saturdays  4. Possible acute bacterial bronchitis -Patient reporting cough with shortness of breath. -Initial workup included a chest x-ray which did not reveal evidence of infiltrate -She remains afebrile -White count within normal limits -Will finish off a 1 week course of Doxy  5. Hyperkalemia -Likely secondary to diabetic ketoacidosis with potassium normalizing this morning. Labs showed potassium of 3.9  6. Hyponatremia -Likely a consequence of DKA/dehydration, initial labs showing sodium of 124 -Labs showing a sodium of 134  7. Hypertension -Continue metoprolol    Consultations:  Nephrology  Discharge Exam: Filed Vitals:   12/06/14 0900  BP: 150/72  Pulse: 81  Temp: 98.2 F (36.8 C)  Resp: 16     General: Patient complaining of nausea  Cardiovascular: Regular rate and rhythm normal S1-S2  Respiratory: Normal respiratory effort, overall lungs clear to auscultation bilaterally  Abdomen: Soft nontender nondistended  Musculoskeletal: No  edema  Discharge Instructions   Discharge Instructions    Call MD for:  difficulty breathing, headache or visual disturbances    Complete by:  As directed      Call MD for:  extreme fatigue    Complete by:  As directed      Call MD for:  hives    Complete by:  As directed      Call MD for:  persistant dizziness or light-headedness    Complete by:  As directed      Call MD for:  persistant nausea and vomiting    Complete by:  As directed      Call MD for:  redness, tenderness, or signs of infection (pain, swelling, redness, odor or green/yellow discharge around incision site)    Complete by:  As directed      Call MD for:  severe uncontrolled pain    Complete by:  As directed      Call MD for:  temperature >100.4    Complete by:  As directed      Call MD for:    Complete by:  As directed      Diet - low sodium heart healthy    Complete by:  As directed      Increase activity slowly    Complete by:  As directed           Current Discharge Medication List    START taking these medications   Details  doxycycline (VIBRA-TABS) 100 MG tablet Take 1 tablet (100 mg total) by mouth daily. Qty:  2 tablet, Refills: 0      CONTINUE these medications which have CHANGED   Details  insulin aspart (NOVOLOG) 100 UNIT/ML injection Inject 6 Units into the skin 2 (two) times daily. Qty: 10 mL, Refills: 0    lidocaine (LIDODERM) 5 % Place 1 patch onto the skin daily. Remove & Discard patch within 12 hours or as directed by MD Qty: 10 patch, Refills: 0    metoCLOPramide (REGLAN) 10 MG tablet Take 1 tablet (10 mg total) by mouth 4 (four) times daily -  before meals and at bedtime. Qty: 60 tablet, Refills: 0    promethazine (PHENERGAN) 25 MG tablet Take 1 tablet (25 mg total) by mouth every 6 (six) hours as needed for nausea or vomiting. Qty: 10 tablet, Refills: 0      CONTINUE these medications which have NOT CHANGED   Details  albuterol (PROVENTIL HFA;VENTOLIN HFA) 108 (90 BASE)  MCG/ACT inhaler Inhale 2 puffs into the lungs every 4 (four) hours as needed for wheezing or shortness of breath (as per home regimen). Qty: 1 Inhaler, Refills: 0    aspirin 325 MG tablet Take 1 tablet (325 mg total) by mouth daily. Qty: 30 tablet, Refills: 0    clonazePAM (KLONOPIN) 0.5 MG tablet Take 0.5 mg by mouth 2 (two) times daily.     doxercalciferol (HECTOROL) 4 MCG/2ML injection Inject 1 mL (2 mcg total) into the vein Every Tuesday,Thursday,and Saturday with dialysis. Qty: 2 mL    gabapentin (NEURONTIN) 100 MG capsule Take 100 mg by mouth 2 (two) times daily.     insulin glargine (LANTUS) 100 UNIT/ML injection Inject 0.07 mLs (7 Units total) into the skin 2 (two) times daily. Qty: 10 mL, Refills: 11    metoprolol succinate (TOPROL-XL) 100 MG 24 hr tablet Take 1 tablet (100 mg total) by mouth daily. Take with or immediately following a meal. Qty: 30 tablet, Refills: 0    montelukast (SINGULAIR) 10 MG tablet Take 10 mg by mouth at bedtime.    multivitamin (RENA-VIT) TABS tablet Take 1 tablet by mouth daily. Qty: 30 tablet, Refills: 0    zolpidem (AMBIEN) 5 MG tablet Take 5 mg by mouth at bedtime as needed. sleep    calcium acetate (PHOSLO) 667 MG capsule Take 1 capsule (667 mg total) by mouth 2 (two) times daily. Qty: 30 capsule, Refills: 0    Darbepoetin Alfa (ARANESP) 40 MCG/0.4ML SOSY injection Inject 0.4 mLs (40 mcg total) into the vein every Thursday with hemodialysis. Qty: 8.4 mL    Nutritional Supplements (FEEDING SUPPLEMENT, NEPRO CARB STEADY,) LIQD Take 237 mLs by mouth 2 (two) times daily between meals. Qty: 60 Can, Refills: 0    pantoprazole (PROTONIX) 40 MG tablet Take 1 tablet (40 mg total) by mouth at bedtime. Qty: 30 tablet, Refills: 0    simvastatin (ZOCOR) 20 MG tablet Take 1 tablet (20 mg total) by mouth at bedtime. Qty: 30 tablet, Refills: 0       Allergies  Allergen Reactions  . Depakote [Divalproex Sodium] Other (See Comments)    Pt gets  "delirious"  . Morphine And Related Nausea And Vomiting and Other (See Comments)    "makes me delirious"  . Penicillins Anaphylaxis    Tolerated Zosyn Dec 2014  . Tramadol Nausea And Vomiting  . Imitrex [Sumatriptan] Other (See Comments)    seizures  . Vicodin [Hydrocodone-Acetaminophen] Itching and Rash      The results of significant diagnostics from this hospitalization (including imaging, microbiology, ancillary and  laboratory) are listed below for reference.    Significant Diagnostic Studies: Dg Chest 2 View  12/02/2014   CLINICAL DATA:  Central line placement.  Pneumonia.  EXAM: CHEST  2 VIEW  COMPARISON:  12/01/2014  FINDINGS: A new right-sided central venous catheter is seen with tip overlying the proximal right atrium. No pneumothorax visualized.  Mild cardiomegaly stable. Decreased interstitial prominence seen since prior study. No evidence of pulmonary airspace disease or pleural effusion.  IMPRESSION: New right sided central venous catheter tip overlies the proximal right atrium. No pneumothorax visualized.  Mild cardiomegaly.  No active lung disease.   Electronically Signed   By: Earle Gell M.D.   On: 12/02/2014 08:40   Dg Chest 2 View  12/01/2014   CLINICAL DATA:  Cough fever congestion and body aches associated with nausea vomiting and diarrhea for the past 2-3 days; history of COPD -asthma, diabetes, and kidney and pancreatic transplant  EXAM: CHEST  2 VIEW  COMPARISON:  Portable chest x-ray of November 10, 2014  FINDINGS: Top-normal in size. The pulmonary vascularity is less prominent today but remains mildly engorged. The mediastinum is normal in width. The bony thorax is unremarkable.  IMPRESSION: Mild pulmonary interstitial edema, improved since the previous study. There is no alveolar pneumonia.   Electronically Signed   By: David  Martinique M.D.   On: 12/01/2014 11:08   Dg Esophagus  11/15/2014   CLINICAL DATA:  Chest pain when swallowing.  Evaluate esophagus.  EXAM:  ESOPHOGRAM/BARIUM SWALLOW  TECHNIQUE: Single contrast examination was performed using  thin barium.  FLUOROSCOPY TIME:  If the device does not provide the exposure index:  Fluoroscopy Time:  2 minutes and 24 seconds  Number of Acquired Images:  None  COMPARISON:  Chest radiograph of 11/10/2014.  FINDINGS: Focused exam was performed using single contrast technique and RAO position.  Evaluation of primary peristalsis demonstrates mild esophageal dysmotility, with contrast stasis in the lower thoracic esophagus.  Full column evaluation of the esophagus demonstrates no persistent narrowing or stricture.  IMPRESSION: 1. No evidence of esophageal stricture. 2. Mild esophageal dysmotility, with contrast stasis in the lower thoracic esophagus.   Electronically Signed   By: Abigail Miyamoto M.D.   On: 11/15/2014 16:22   Ir Pta Venous Right  12/02/2014   CLINICAL DATA:  End stage renal disease, prolonged bleeding after hemodialysis sections  FLUOROSCOPY TIME:  1 minutes 6 seconds, and 237 mGy  COMPARISON:  04/16/2014  EXAM: DIALYSIS SHUNTOGRAM  VENOUS ANGIOPLASTY  TECHNIQUE: The procedure, risks (including but not limited to bleeding, infection, organ damage ), benefits, and alternatives were explained to the patient. Questions regarding the procedure were encouraged and answered. The patient understands and consents to the procedure.  An 18-gauge angiocatheter was placed antegrade into the arterial limb of the patient's right thigh synthetic loopAV hemodialysis fistula for dialysis fistulography. The angiocatheter and surrounding skin were then prepped with Betadine, draped in usual sterile fashion, infiltrated locally with 1% lidocaine.  Intravenous Fentanyl and Versed were administered as conscious sedation during continuous cardiorespiratory monitoring by the radiology RN, with a total moderate sedation time of 10 minutes.  The angiocatheter was exchanged over a Benson wire for a 6 [place] vascular sheath, through which a 12  mm x 4 cm atlas angioplasty balloon was advanced to the level of a venous outflowstenosis for venous angioplasty using 60 second overlapping inflations at 18 atmospheres. After follow-up venography, additional PTA was performed with a 14 mm x 4 cm atlas balloon. After  final venography, the catheter, sheath, and guidewire were removed and hemostasis achieved with a 2-0 Ethilon purse-string suture. No immediate complication.  FINDINGS: Arterial anastomosis is widely patent. There is mild irregularity in the venous limb and apex of the graft without significant intragraft stenosis. The venous anastomosis is patent. The outflow common femoral vein has a moderate tapered narrowing, and there is another focus of moderate short segment narrowing in the right external iliac vein just inferior to the and iliac venous stent. Body wall collaterals draining around this level from the common femoral vein attest to the hemodynamic significance of these lesions. The iliac venous stent is widely patent, and native venous outflow more centrally through the IVC is widely patent.  The outflow vein stenoses responded moderately well to 12 mm balloon angioplasty but follow-up venography demonstrated significant recoil resulting greater than 50% diameter stenosis. No extravasation or dissection. Additional angioplasty with a 14 mm balloon resulted in improved post angiographic appearance with minimal recoil, decreased filling of collateral venous channels, no extravasation or stenosis.  IMPRESSION: 1. 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  ACCESS: Remains approachable for percutaneous intervention as needed.   Electronically Signed   By: Lucrezia Europe M.D.   On: 12/02/2014 11:09   Ir Fluoro Guide Cv Line Right  12/01/2014   CLINICAL DATA:  DIABETIC KETOACIDOSIS, access for insulin drip appear  EXAM: RIGHT INTERNAL JUGULAR DOUBLE-LUMEN NON TUNNELED POWER PICC LINE PLACEMENT WITH ULTRASOUND AND  FLUOROSCOPIC GUIDANCE  FLUOROSCOPY TIME:  30 SECONDS  PROCEDURE: The patient was advised of the possible risks andcomplications and agreed to undergo the procedure. The patient was then brought to the angiographic suite for the procedure.  The right neckwas prepped with chlorhexidine, drapedin the usual sterile fashion using maximum barrier technique (cap and mask, sterile gown, sterile gloves, large sterile sheet, hand hygiene and cutaneous antisepsis) and infiltrated locally with 1% Lidocaine.  Ultrasound demonstrated patency of the right internal vein, and this was documented with an image. Under real-time ultrasound guidance, this vein was accessed with a 21 gauge micropuncture needle and image documentation was performed. A 0.018 wire was introduced in to the vein. Over this, a 5 [place] double lumen non tunneled power PICC was advanced to the lower SVC/right atrial junction. Fluoroscopy during the procedure and fluoro spot radiograph confirms appropriate catheter position. The catheter was flushed and covered with asterile dressing.  Catheter length: 16  Complications: None immediate  IMPRESSION: Successful right internal jugular double-lumen non tunneled power PICC line placement with ultrasound and fluoroscopic guidance. The catheter is ready for use.   Electronically Signed   By: Jerilynn Mages.  Shick M.D.   On: 12/01/2014 17:43   Ir US Guide Vasc Access Right  12/01/2014   CLINICAL DATA:  DIABETIC KETOACIDOSIS, access for insulin drip appear  EXAM: RIGHT INTERNAL JUGULAR DOUBLE-LUMEN NON TUNNELED POWER PICC LINE PLACEMENT WITH ULTRASOUND AND FLUOROSCOPIC GUIDANCE  FLUOROSCOPY TIME:  30 SECONDS  PROCEDURE: The patient was advised of the possible risks andcomplications and agreed to undergo the procedure. The patient was then brought to the angiographic suite for the procedure.  The right neckwas prepped with chlorhexidine, drapedin the usual sterile fashion using maximum barrier technique (cap and mask, sterile gown,  sterile gloves, large sterile sheet, hand hygiene and cutaneous antisepsis) and infiltrated locally with 1% Lidocaine.  Ultrasound demonstrated patency of the right internal vein, and this was documented with an image. Under real-time ultrasound guidance, this vein was accessed with a  21 gauge micropuncture needle and image documentation was performed. A 0.018 wire was introduced in to the vein. Over this, a 5 [place] double lumen non tunneled power PICC was advanced to the lower SVC/right atrial junction. Fluoroscopy during the procedure and fluoro spot radiograph confirms appropriate catheter position. The catheter was flushed and covered with asterile dressing.  Catheter length: 16  Complications: None immediate  IMPRESSION: Successful right internal jugular double-lumen non tunneled power PICC line placement with ultrasound and fluoroscopic guidance. The catheter is ready for use.   Electronically Signed   By: Jerilynn Mages.  Shick M.D.   On: 12/01/2014 17:43   Dg Chest Port 1 View  11/10/2014   CLINICAL DATA:  Dyspnea  EXAM: PORTABLE CHEST - 1 VIEW  COMPARISON:  08/04/2014  FINDINGS: Chronic cardiomegaly, accentuated by hypoventilation and portable technique. Stable aortic and hilar contours, distorted from technique.  Interstitial and hazy density crowding at the bases without definite pneumonia or edema. No effusion or pneumothorax.  IMPRESSION: 1. Bibasilar opacity is likely atelectasis given hypoventilation. No edema or definitive pneumonia. 2. Chronic cardiomegaly.   Electronically Signed   By: Monte Fantasia M.D.   On: 11/10/2014 10:06   Ir Shuntogram/ Fistulagram Right Mod Sed  12/02/2014   CLINICAL DATA:  End stage renal disease, prolonged bleeding after hemodialysis sections  FLUOROSCOPY TIME:  1 minutes 6 seconds, and 237 mGy  COMPARISON:  04/16/2014  EXAM: DIALYSIS SHUNTOGRAM  VENOUS ANGIOPLASTY  TECHNIQUE: The procedure, risks (including but not limited to bleeding, infection, organ damage ), benefits, and  alternatives were explained to the patient. Questions regarding the procedure were encouraged and answered. The patient understands and consents to the procedure.  An 18-gauge angiocatheter was placed antegrade into the arterial limb of the patient's right thigh synthetic loopAV hemodialysis fistula for dialysis fistulography. The angiocatheter and surrounding skin were then prepped with Betadine, draped in usual sterile fashion, infiltrated locally with 1% lidocaine.  Intravenous Fentanyl and Versed were administered as conscious sedation during continuous cardiorespiratory monitoring by the radiology RN, with a total moderate sedation time of 10 minutes.  The angiocatheter was exchanged over a Benson wire for a 6 [place] vascular sheath, through which a 12 mm x 4 cm atlas angioplasty balloon was advanced to the level of a venous outflowstenosis for venous angioplasty using 60 second overlapping inflations at 18 atmospheres. After follow-up venography, additional PTA was performed with a 14 mm x 4 cm atlas balloon. After final venography, the catheter, sheath, and guidewire were removed and hemostasis achieved with a 2-0 Ethilon purse-string suture. No immediate complication.  FINDINGS: Arterial anastomosis is widely patent. There is mild irregularity in the venous limb and apex of the graft without significant intragraft stenosis. The venous anastomosis is patent. The outflow common femoral vein has a moderate tapered narrowing, and there is another focus of moderate short segment narrowing in the right external iliac vein just inferior to the and iliac venous stent. Body wall collaterals draining around this level from the common femoral vein attest to the hemodynamic significance of these lesions. The iliac venous stent is widely patent, and native venous outflow more centrally through the IVC is widely patent.  The outflow vein stenoses responded moderately well to 12 mm balloon angioplasty but follow-up  venography demonstrated significant recoil resulting greater than 50% diameter stenosis. No extravasation or dissection. Additional angioplasty with a 14 mm balloon resulted in improved post angiographic appearance with minimal recoil, decreased filling of collateral venous channels, no extravasation or stenosis.  IMPRESSION: 1. 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  ACCESS: Remains approachable for percutaneous intervention as needed.   Electronically Signed   By: Lucrezia Europe M.D.   On: 12/02/2014 11:09    Microbiology: Recent Results (from the past 240 hour(s))  Culture, blood (routine x 2)     Status: None (Preliminary result)   Collection Time: 12/01/14 10:17 AM  Result Value Ref Range Status   Specimen Description BLOOD LEFT THUMB  Final   Special Requests BOTTLES DRAWN AEROBIC ONLY 2CC  Final   Culture NO GROWTH 4 DAYS  Final   Report Status PENDING  Incomplete  Culture, blood (routine x 2)     Status: None (Preliminary result)   Collection Time: 12/01/14 10:43 AM  Result Value Ref Range Status   Specimen Description BLOOD LEFT ANTECUBITAL  Final   Special Requests BOTTLES DRAWN AEROBIC AND ANAEROBIC 5CC  Final   Culture NO GROWTH 4 DAYS  Final   Report Status PENDING  Incomplete  MRSA PCR Screening     Status: Abnormal   Collection Time: 12/01/14  2:50 PM  Result Value Ref Range Status   MRSA by PCR INVALID RESULTS, SPECIMEN SENT FOR CULTURE (A) NEGATIVE Final    Comment: CALLED TO C.WOODARD,RN BY L.PITT 12/01/14        The GeneXpert MRSA Assay (FDA approved for NASAL specimens only), is one component of a comprehensive MRSA colonization surveillance program. It is not intended to diagnose MRSA infection nor to guide or monitor treatment for MRSA infections.   MRSA culture     Status: None   Collection Time: 12/01/14  2:50 PM  Result Value Ref Range Status   Specimen Description NASOPHARYNGEAL  Final   Special Requests NONE  Final    Culture NOMRSA Performed at [facility]   Final   Report Status 12/03/2014 FINAL  Final     Labs: Basic Metabolic Panel:  Recent Labs Lab 12/02/14 0345 12/02/14 1102 12/03/14 0500 12/03/14 1557 12/04/14 0754  NA 132* 133* 134* 137 134*  K 3.4* 3.9 3.4* 4.2 4.1  CL 93* 94* 97* 98* 98*  CO2 23 24 27 26 25   GLUCOSE 140* 212* 177* 205* 208*  BUN 47* 51* 12 17 21*  CREATININE 10.58* 11.28* 5.07* 6.56* 8.14*  CALCIUM 7.6* 7.7* 7.4* 8.1* 7.9*  PHOS  --   --   --  2.4* 3.5   Liver Function Tests:  Recent Labs Lab 12/01/14 1017 12/03/14 1557 12/04/14 0754  AST 64*  --   --   ALT QUANTITY NOT SUFFICIENT, UNABLE TO PERFORM TEST  --   --   ALKPHOS 134*  --   --   BILITOT QUANTITY NOT SUFFICIENT, UNABLE TO PERFORM TEST  --   --   PROT 7.6  --   --   ALBUMIN 3.5 3.0* 2.7*   No results for input(s): LIPASE, AMYLASE in the last 168 hours. No results for input(s): AMMONIA in the last 168 hours. CBC:  Recent Labs Lab 12/01/14 1017 12/03/14 0727 12/04/14 0755  WBC 8.9 4.8 5.1  NEUTROABS 7.5  --   --   HGB 10.8* 9.3* 9.6*  HCT 35.3* 29.1* 31.0*  MCV 103.5* 95.1 97.5  PLT 108* 120* 139*   Cardiac Enzymes: No results for input(s): CKTOTAL, CKMB, CKMBINDEX, TROPONINI in the last 168 hours. BNP: BNP (last 3 results) No results for input(s): BNP in the last 8760 hours.  ProBNP (last 3  results)  Recent Labs  01/14/14 0216  PROBNP 17541.0*    CBG:  Recent Labs Lab 12/05/14 1126 12/05/14 1622 12/05/14 2117 12/06/14 0750 12/06/14 1229  GLUCAP 274* 173* 89 323* 138*       Signed:  Lennan Malone  [facility] 12/06/2014, 1:01 PM

## 2014-12-06 NOTE — Care Management Note (Signed)
Case Management Note  Patient Details  Name: [name] MRN: [id number] Date of Birth: [birth year]  Subjective/Objective:       CM following for progression and d/c planning             Action/Plan: MD requesting assistance with CBG meter, this CM asked MD to give pt a prescription of a glucometer and pt will be instructed to go to [facility] where she will receive a free meter.  This was explained to the pt RN.  Expected Discharge Date:      12/06/2014            Expected Discharge Plan:  Home/Self Care  In-House Referral:  NA  Discharge planning Services  NA  Post Acute Care Choice:  NA Choice offered to:  NA  DME Arranged:    DME Agency:     HH Arranged:    HH Agency:     Status of Service:  Completed, signed off  Medicare Important Message Given:  Yes-second notification given Date Medicare IM Given:    Medicare IM give by:    Date Additional Medicare IM Given:    Additional Medicare Important Message give by:     If discussed at [place] of Stay Meetings, dates discussed:    Additional Comments:  Adron Bene, RN 12/06/2014, 11:43 AM

## 2014-12-06 NOTE — Care Management (Signed)
IM given , plan to d/c to home toda.

## 2014-12-06 NOTE — Progress Notes (Signed)
[facility] Progress Note  Assessment/Plan: 1. DKA per primary/ BS 89 - 323 2. ESRD - TTS - next HD Tuesday 3. Anemia - Hgb 9.6 Aranesp 40 q thursday 4. Secondary hyperparathyroidism - Ca/P ok, on hectorol and phsolo 5. HTN/volume - net UF 1.9 Saturday got to edw Sat by measures though pt not actually weighed- BP generally ok, though up some this am 6. Nutrition - carb mod diet + vitamin 7. Acute bronchitis - on doxy/other meds per primary 8. MRSA + by history - NP culture - negative 7/6 9.  Access issue - had shuntogram 7/6 for prolonged bleeding done by IR - PTA of tandem outflow vein stenosis- she says she still is having prolonged bleeding - plan repeat AF at her HD unit and follow there - she is chronically on no heparin HD  [name], PA-C [place] [phone] 12/06/2014,9:07 AM  LOS: 5 days   Pt seen, examined and agree w A/P as above.  Kelly Splinter MD pager [phone]    cell [phone] 12/07/2014, 12:39 PM    Subjective:   No c/o  Objective Filed Vitals:   12/05/14 1000 12/05/14 1700 12/05/14 2018 12/06/14 0537  BP: 147/73 124/52 121/70 165/81  Pulse: 78 80 80 82  Temp: 98 F (36.7 C) 97.9 F (36.6 C) 98.6 F (37 C) 97.9 F (36.6 C)  TempSrc: Oral Oral Oral Oral  Resp: 20 20 16 18   Height:      Weight:      SpO2: 100% 100% 98% 95%   Physical Exam General: NAD Heart: RRR Lungs: no rales or rhonchi Abdomen:soft Extremities: no sig edema Dialysis Access: right thigh AVGG + bruit  Dialysis Orders: HD: TTS NW 3hr 45 mins 68kgs 2K/2.5ca R thigh AVG No heparin Hectorol 3 Micera 150 q 2 - last dose 6/30  Additional Objective Labs: Basic Metabolic Panel:  Recent Labs Lab 12/03/14 0500 12/03/14 1557 12/04/14 0754  NA 134* 137 134*  K 3.4* 4.2 4.1  CL 97* 98* 98*  CO2 27 26 25   GLUCOSE 177* 205* 208*  BUN 12 17 21*  CREATININE 5.07* 6.56* 8.14*  CALCIUM 7.4* 8.1* 7.9*  PHOS  --  2.4* 3.5    CBC:  Recent Labs Lab 12/01/14 1017 12/03/14 0727 12/04/14 0755  WBC 8.9 4.8 5.1  NEUTROABS 7.5  --   --   HGB 10.8* 9.3* 9.6*  HCT 35.3* 29.1* 31.0*  MCV 103.5* 95.1 97.5  PLT 108* 120* 139*   CBG:  Recent Labs Lab 12/05/14 0828 12/05/14 1126 12/05/14 1622 12/05/14 2117 12/06/14 0750  GLUCAP 125* 274* 173* 89 323*  Studies/Results: No results found. Medications: . sodium chloride    . sodium chloride 1,000 mL (12/03/14 2253)   . aspirin  325 mg Oral Daily  . benzonatate  100 mg Oral TID  . calcium acetate  667 mg Oral BID WC  . clonazePAM  0.5 mg Oral BID  . Darbepoetin Alfa  40 mcg Intravenous Q Thu-HD  . doxercalciferol  3 mcg Intravenous Q T,Th,Sa-HD  . doxycycline  100 mg Oral Q12H  . enoxaparin (LOVENOX) injection  30 mg Subcutaneous Q24H  . gabapentin  100 mg Oral BID  . insulin aspart  0-15 Units Subcutaneous TID WC  . insulin glargine  7 Units Subcutaneous BID  . metoCLOPramide  10 mg Oral TID AC & HS  . metoprolol succinate  50 mg Oral Daily  . montelukast  10 mg Oral QHS  .  multivitamin  1 tablet Oral QHS  . pantoprazole  40 mg Oral QHS  . simvastatin  20 mg Oral QHS  . sodium chloride  10-40 mL Intracatheter Q12H

## 2014-12-07 LAB — GLUCOSE, CAPILLARY
Glucose-Capillary: 26 mg/dL — CL (ref 65–99)
Glucose-Capillary: 34 mg/dL — CL (ref 65–99)

## 2014-12-10 IMAGING — CR DG CHEST 2V
2 series · 2 of 2 positions shown · non-contrast
Comparison: 07/02/2012.

CLINICAL DATA: Migraine.  Cough.  Chest pain.  Short of breath.

CHEST - 2 VIEW

[w chest pa]
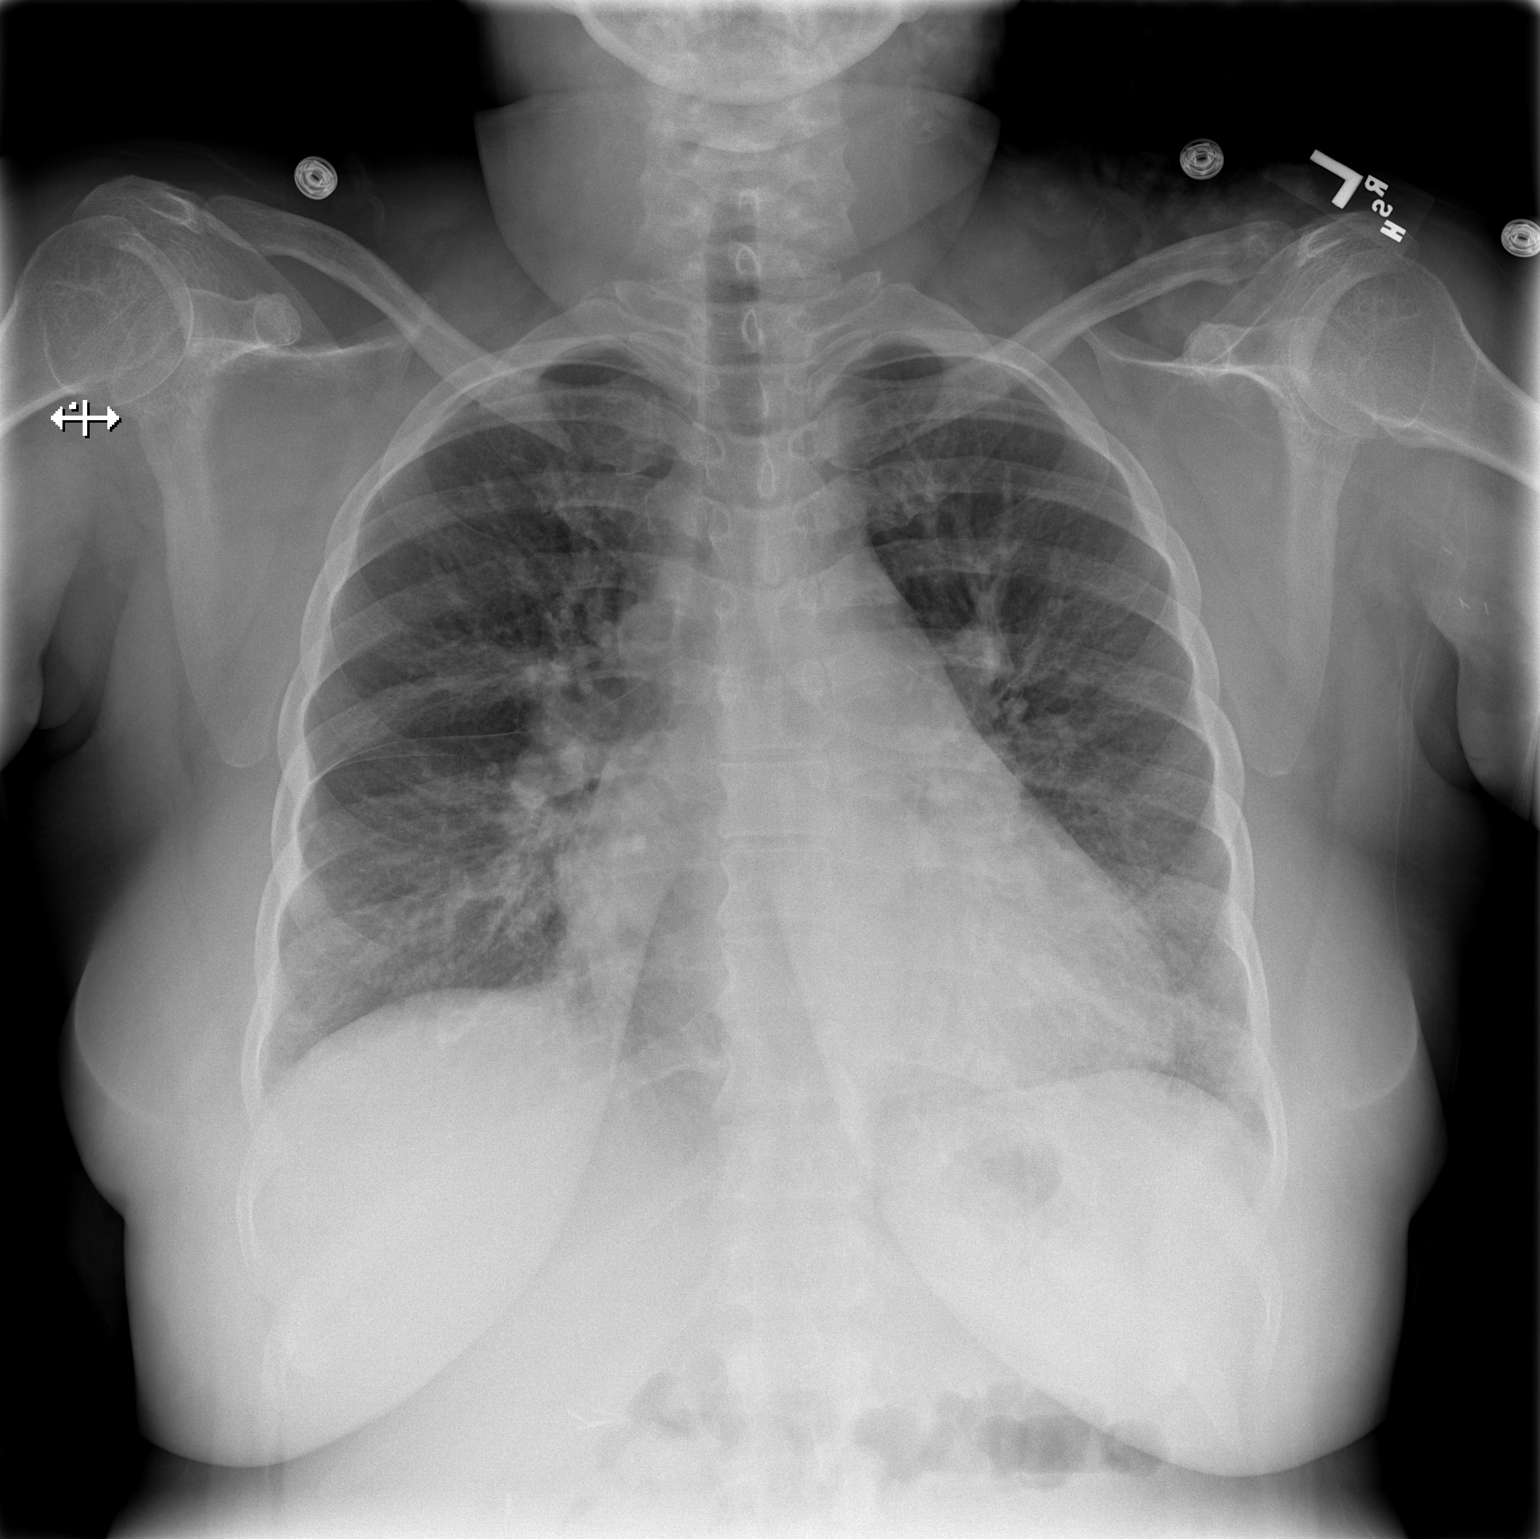

[w chest lat]
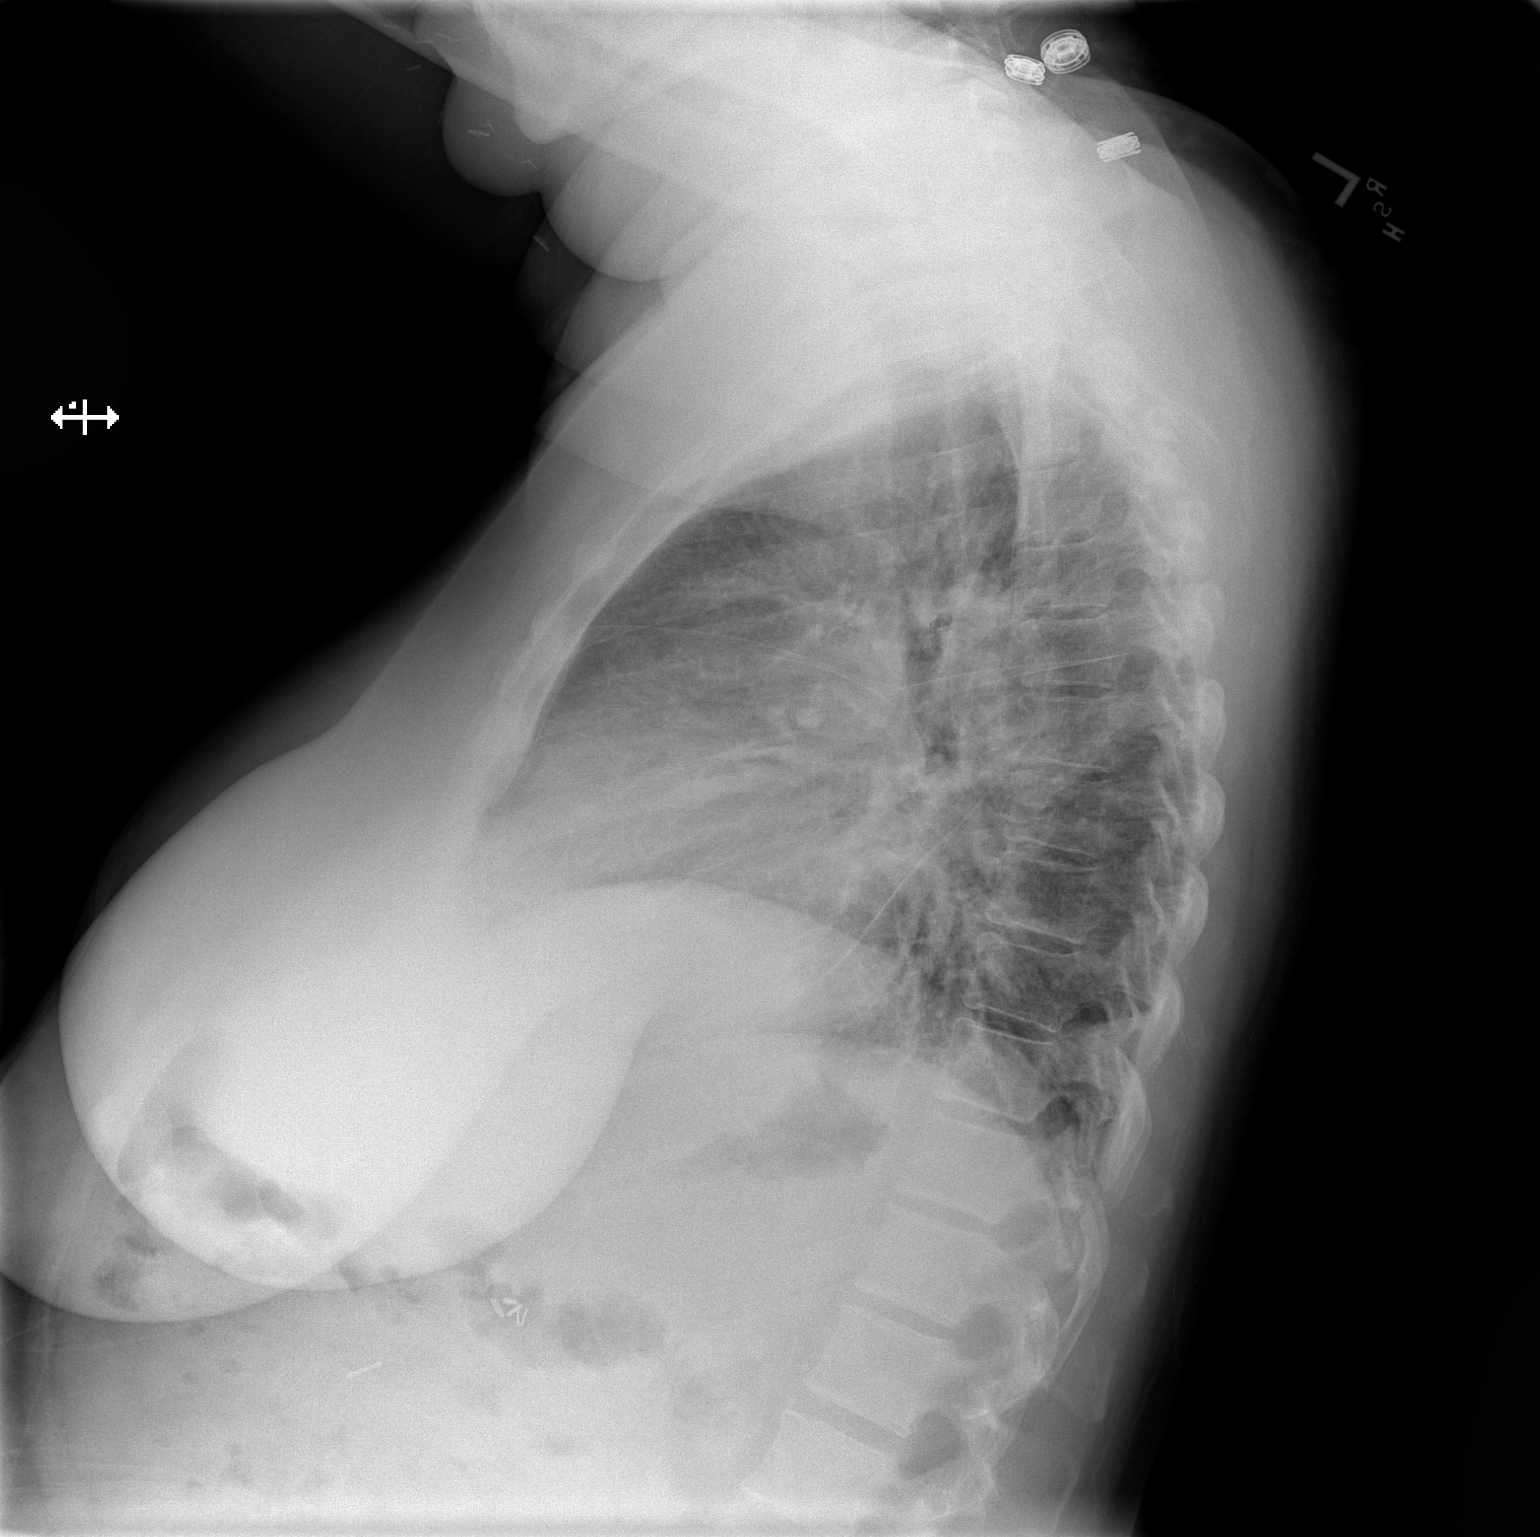

[2 of 2 positions shown; findings below may reference images not displayed]

FINDINGS: The left IJ dialysis catheter is no longer present.
Cardiopericardial silhouette is mildly enlarged.  Interstitial
pulmonary edema is present along the cephalization of pulmonary
blood flow compatible with volume overload.  Thickening of the
fissures is present on the lateral view. Cholecystectomy clips are
present in the right upper quadrant.  Mediastinal contours appear
within normal limits. No effusion or focal consolidation.
IMPRESSION: Findings compatible with mild CHF/volume overload with interstitial
pulmonary edema.

## 2014-12-27 ENCOUNTER — Encounter: Payer: Self-pay | Admitting: Vascular Surgery

## 2014-12-29 ENCOUNTER — Other Ambulatory Visit: Payer: Self-pay

## 2014-12-29 ENCOUNTER — Encounter: Payer: Self-pay | Admitting: Vascular Surgery

## 2014-12-29 ENCOUNTER — Ambulatory Visit (INDEPENDENT_AMBULATORY_CARE_PROVIDER_SITE_OTHER): Payer: Medicare Other | Admitting: Vascular Surgery

## 2014-12-29 VITALS — BP 166/84 | HR 80 | Temp 97.9°F | Resp 18 | Ht 61.0 in | Wt 151.0 lb

## 2014-12-29 DIAGNOSIS — N185 Chronic kidney disease, stage 5: Secondary | ICD-10-CM | POA: Diagnosis not present

## 2014-12-29 NOTE — Progress Notes (Signed)
Patient ID: MARYLOUISE MALLET, female   DOB: 10/14/1970, 44 y.o.   MRN: [id number]   [facility]  Patient name: [name] MRN: [id number] DOB: 02/09/1971 Sex: female  REASON FOR VISIT: Prolonged bleeding from right thigh AVG. Referred by Dr. Jamal Maes.  HPI: [name] is a 44 y.o. female who had a right thigh AV graft placed in 2013. This has been surgically revised once. According to the patient, she has had multiple procedures at [facility]. In reviewing her films she has had a stent placed in the iliac vein on the right. Her most recent study was done by the interventional radiologist last month and she had successful venoplasty of an outflow stenosis in the femoral vein and iliac vein. 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 She also has significant pain associated with her fistula. She feels that this pain is no longer tolerable.  She has a complicated access history. On the right side she had a DRIL procedure done because of steal. 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 This was not successful and therefore the right arm was abandoned and was not felt to be amenable to further access. 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 For these reasons she is not a candidate for upper extremity access. Her only remaining option for access is a left thigh AV graft.   Past Medical History  Diagnosis Date  . Hyperlipidemia   . Alopecia   . Obesity   . Iron deficiency   . ESRD (end stage renal disease)     S/p pancreatic and kidney transplant, but back on HD 2008, dialysis at [facility], [place], Sat  . RLS (restless legs syndrome)   . Injury of conjunctiva and corneal abrasion of right eye without foreign body   . Cellulitis   . Anemia   . Blood transfusion 2004    "when I had my transplant"  . Migraine     . Hyperparathyroidism, secondary   . Diabetic retinopathy   . Hypertension     takes Metoprolol and Exforge daily  . Depression     takes Klonopin nightly  . Diabetes mellitus type 1     Pt states diagnosed at age 42 with prior episodes of DKA. S/p pancreatic transplant   . GERD (gastroesophageal reflux disease)     takes Protonix daily  . Bronchitis   . Migraine   . Asthma   . Emphysema of lung   . Angina     none in past 2 years.  . Stomach pain     Hx: of chronic  . Pneumonia 2012    "double"  . Dialysis patient     tues,thurs,sat  . 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  Surgery was done at [facility].     . DKA (diabetic ketoacidoses) 12/01/2014   Family History  Problem Relation Age of Onset  . Hypertension Mother   . Kidney disease Mother   . Diabetes Mother   . Hypertension Father   . Kidney disease Father   . Diabetes Father    SOCIAL HISTORY: History  Substance Use Topics  .  Smoking status: Never Smoker   . Smokeless tobacco: Never Used  . Alcohol Use: No   Allergies  Allergen Reactions  . Depakote [Divalproex Sodium] Other (See Comments)    Pt gets "delirious"  . Morphine And Related Nausea And Vomiting and Other (See Comments)    "makes me delirious"  . Penicillins Anaphylaxis    Tolerated Zosyn Dec 2014  . Tramadol Nausea And Vomiting  . Imitrex [Sumatriptan] Other (See Comments)    seizures  . Vicodin [Hydrocodone-Acetaminophen] Itching and Rash   Current Outpatient Prescriptions  Medication Sig Dispense Refill  . albuterol (PROVENTIL HFA;VENTOLIN HFA) 108 (90 BASE) MCG/ACT inhaler Inhale 2 puffs into the lungs every 4 (four) hours as needed for wheezing or shortness of breath (as per home regimen). 1 Inhaler 0  . aspirin 325 MG tablet Take  1 tablet (325 mg total) by mouth daily. 30 tablet 0  . calcium acetate (PHOSLO) 667 MG capsule Take 1 capsule (667 mg total) by mouth 2 (two) times daily. (Patient taking differently: Take 2,001 mg by mouth 3 (three) times daily with meals. ) 30 capsule 0  . clonazePAM (KLONOPIN) 0.5 MG tablet Take 0.5 mg by mouth 2 (two) times daily.     . Darbepoetin Alfa (ARANESP) 40 MCG/0.4ML SOSY injection Inject 0.4 mLs (40 mcg total) into the vein every Thursday with hemodialysis. 8.4 mL   . doxercalciferol (HECTOROL) 4 MCG/2ML injection Inject 1 mL (2 mcg total) into the vein Every Tuesday,Thursday,and Saturday with dialysis. 2 mL   . doxycycline (VIBRA-TABS) 100 MG tablet Take 1 tablet (100 mg total) by mouth daily. 2 tablet 0  . gabapentin (NEURONTIN) 100 MG capsule Take 100 mg by mouth 2 (two) times daily.     . insulin aspart (NOVOLOG) 100 UNIT/ML injection Inject 6 Units into the skin 2 (two) times daily. 10 mL 0  . insulin glargine (LANTUS) 100 UNIT/ML injection Inject 0.07 mLs (7 Units total) into the skin 2 (two) times daily. 10 mL 11  . lidocaine (LIDODERM) 5 % Place 1 patch onto the skin daily. Remove & Discard patch within 12 hours or as directed by MD 10 patch 0  . metoCLOPramide (REGLAN) 10 MG tablet Take 1 tablet (10 mg total) by mouth 4 (four) times daily -  before meals and at bedtime. 60 tablet 0  . metoprolol succinate (TOPROL-XL) 100 MG 24 hr tablet Take 1 tablet (100 mg total) by mouth daily. Take with or immediately following a meal. (Patient taking differently: Take 100 mg by mouth daily after supper. Take with or immediately following a meal.) 30 tablet 0  . montelukast (SINGULAIR) 10 MG tablet Take 10 mg by mouth at bedtime.    . multivitamin (RENA-VIT) TABS tablet Take 1 tablet by mouth daily. 30 tablet 0  . Nutritional Supplements (FEEDING SUPPLEMENT, NEPRO CARB STEADY,) LIQD Take 237 mLs by mouth 2 (two) times daily between meals. 60 Can 0  . pantoprazole (PROTONIX) 40 MG tablet Take  1 tablet (40 mg total) by mouth at bedtime. 30 tablet 0  . promethazine (PHENERGAN) 25 MG tablet Take 1 tablet (25 mg total) by mouth every 6 (six) hours as needed for nausea or vomiting. 10 tablet 0  . simvastatin (ZOCOR) 20 MG tablet Take 1 tablet (20 mg total) by mouth at bedtime. 30 tablet 0  . zolpidem (AMBIEN) 5 MG tablet Take 5 mg by mouth at bedtime as needed. sleep    . [DISCONTINUED] NORVASC 10 MG tablet Take 1 tablet  by mouth Daily.     No current facility-administered medications for this visit.   REVIEW OF SYSTEMS: Valu.Nieves ] denotes positive finding; [  ] denotes negative finding  CARDIOVASCULAR:  [ ]  chest pain   [ ]  chest pressure   [ ]  palpitations   [ ]  orthopnea   [ ]  dyspnea on exertion   [ ]  claudication   [ ]  rest pain   [ ]  DVT   [ ]  phlebitis PULMONARY:   [ ]  productive cough   [ ]  asthma   [ ]  wheezing NEUROLOGIC:   [ ]  weakness  [ ]  paresthesias  [ ]  aphasia  [ ]  amaurosis  [ ]  dizziness HEMATOLOGIC:   [ ]  bleeding problems   [ ]  clotting disorders MUSCULOSKELETAL:  [ ]  joint pain   [ ]  joint swelling Valu.Nieves ] leg swelling GASTROINTESTINAL: [ ]   blood in stool  [ ]   hematemesis GENITOURINARY:  [ ]   dysuria  [ ]   hematuria PSYCHIATRIC:  [ ]  history of major depression INTEGUMENTARY:  [ ]  rashes  [ ]  ulcers CONSTITUTIONAL:  [ ]  fever   [ ]  chills  PHYSICAL EXAM: Filed Vitals:   12/29/14 1404 12/29/14 1408  BP: 180/80 166/84  Pulse: 82 80  Temp: 97.9 F (36.6 C)   TempSrc: Oral   Resp: 18   Height: 5\' 1"  (1.549 m)   Weight: 151 lb (68.493 kg)   SpO2: 99%    GENERAL: The patient is a well-nourished female, in no acute distress. The vital signs are documented above. CARDIAC: There is a regular rate and rhythm.  VASCULAR: she has palpable dorsalis pedis pulses bilaterally. There is an excellent thrill in her right thigh AV graft. It is slightly pulsatile. PULMONARY: There is good air exchange bilaterally without wheezing or rales. ABDOMEN: Soft and non-tender with  normal pitched bowel sounds.  MUSCULOSKELETAL: There are no major deformities or cyanosis. NEUROLOGIC: No focal weakness or paresthesias are detected. SKIN: There are no ulcers or rashes noted. PSYCHIATRIC: The patient has a normal affect.  DATA:  I have reviewed her fistulogram that was done by interventional radiology last month. There was some outflow stenosis that was successfully ballooned. There is a stent in the iliac vein. The graft itself looked fairly good. It does not look like the arterial anastomosis was studied however by exam there is no significant inflow problems.  MEDICAL ISSUES:  STAGE V CHRONIC KIDNEY DISEASE:  I do not see anyway to surgically revise her right thigh AV graft given the stent in the iliac vein. I do not see any problems with the graft itself. I've explained that from my standpoint only remaining option is a left thigh AV graft. I have encouraged her to continue to use her right thigh AV graft as long as possible before placing a left thigh AV graft. However she feels that [place] pain is not tolerable and therefore wishes to proceed with placement of a left thigh AV graft. This has been scheduled for 01/14/2015. We will keep her overnight and she can dialyze in the hospital before discharge the following day.  Deitra Mayo [facility] [place]: [phone]

## 2015-01-02 ENCOUNTER — Inpatient Hospital Stay (HOSPITAL_COMMUNITY)
Admission: EM | Admit: 2015-01-02 | Discharge: 2015-01-07 | DRG: 698 | Disposition: A | Discharge status: Home / Self Care | Payer: Medicare Other | Attending: Internal Medicine | Admitting: Internal Medicine

## 2015-01-02 ENCOUNTER — Encounter (HOSPITAL_COMMUNITY): Payer: Self-pay | Admitting: Vascular Surgery

## 2015-01-02 ENCOUNTER — Inpatient Hospital Stay (HOSPITAL_COMMUNITY): Payer: Medicare Other

## 2015-01-02 ENCOUNTER — Emergency Department (HOSPITAL_COMMUNITY): Payer: Medicare Other

## 2015-01-02 DIAGNOSIS — D638 Anemia in other chronic diseases classified elsewhere: Secondary | ICD-10-CM | POA: Diagnosis present

## 2015-01-02 DIAGNOSIS — T8612 Kidney transplant failure: Secondary | ICD-10-CM | POA: Diagnosis present

## 2015-01-02 DIAGNOSIS — R5383 Other fatigue: Secondary | ICD-10-CM | POA: Diagnosis present

## 2015-01-02 DIAGNOSIS — J45909 Unspecified asthma, uncomplicated: Secondary | ICD-10-CM | POA: Diagnosis present

## 2015-01-02 DIAGNOSIS — N179 Acute kidney failure, unspecified: Secondary | ICD-10-CM

## 2015-01-02 DIAGNOSIS — I5032 Chronic diastolic (congestive) heart failure: Secondary | ICD-10-CM | POA: Insufficient documentation

## 2015-01-02 DIAGNOSIS — I10 Essential (primary) hypertension: Secondary | ICD-10-CM | POA: Diagnosis not present

## 2015-01-02 DIAGNOSIS — Z992 Dependence on renal dialysis: Secondary | ICD-10-CM

## 2015-01-02 DIAGNOSIS — R451 Restlessness and agitation: Secondary | ICD-10-CM | POA: Diagnosis not present

## 2015-01-02 DIAGNOSIS — Z9483 Pancreas transplant status: Secondary | ICD-10-CM

## 2015-01-02 DIAGNOSIS — N186 End stage renal disease: Secondary | ICD-10-CM | POA: Diagnosis present

## 2015-01-02 DIAGNOSIS — Z885 Allergy status to narcotic agent status: Secondary | ICD-10-CM

## 2015-01-02 DIAGNOSIS — I120 Hypertensive chronic kidney disease with stage 5 chronic kidney disease or end stage renal disease: Secondary | ICD-10-CM | POA: Diagnosis present

## 2015-01-02 DIAGNOSIS — E10649 Type 1 diabetes mellitus with hypoglycemia without coma: Secondary | ICD-10-CM | POA: Diagnosis present

## 2015-01-02 DIAGNOSIS — Z794 Long term (current) use of insulin: Secondary | ICD-10-CM

## 2015-01-02 DIAGNOSIS — F419 Anxiety disorder, unspecified: Secondary | ICD-10-CM | POA: Diagnosis present

## 2015-01-02 DIAGNOSIS — E871 Hypo-osmolality and hyponatremia: Secondary | ICD-10-CM | POA: Diagnosis present

## 2015-01-02 DIAGNOSIS — I5033 Acute on chronic diastolic (congestive) heart failure: Secondary | ICD-10-CM | POA: Diagnosis present

## 2015-01-02 DIAGNOSIS — E1110 Type 2 diabetes mellitus with ketoacidosis without coma: Secondary | ICD-10-CM | POA: Diagnosis present

## 2015-01-02 DIAGNOSIS — F329 Major depressive disorder, single episode, unspecified: Secondary | ICD-10-CM | POA: Diagnosis present

## 2015-01-02 DIAGNOSIS — E785 Hyperlipidemia, unspecified: Secondary | ICD-10-CM | POA: Diagnosis present

## 2015-01-02 DIAGNOSIS — J189 Pneumonia, unspecified organism: Secondary | ICD-10-CM | POA: Diagnosis present

## 2015-01-02 DIAGNOSIS — E11319 Type 2 diabetes mellitus with unspecified diabetic retinopathy without macular edema: Secondary | ICD-10-CM | POA: Diagnosis present

## 2015-01-02 DIAGNOSIS — Z888 Allergy status to other drugs, medicaments and biological substances status: Secondary | ICD-10-CM

## 2015-01-02 DIAGNOSIS — Z7982 Long term (current) use of aspirin: Secondary | ICD-10-CM

## 2015-01-02 DIAGNOSIS — Z8249 Family history of ischemic heart disease and other diseases of the circulatory system: Secondary | ICD-10-CM | POA: Diagnosis not present

## 2015-01-02 DIAGNOSIS — E1043 Type 1 diabetes mellitus with diabetic autonomic (poly)neuropathy: Secondary | ICD-10-CM | POA: Diagnosis present

## 2015-01-02 DIAGNOSIS — Z833 Family history of diabetes mellitus: Secondary | ICD-10-CM | POA: Diagnosis not present

## 2015-01-02 DIAGNOSIS — G2581 Restless legs syndrome: Secondary | ICD-10-CM | POA: Diagnosis present

## 2015-01-02 DIAGNOSIS — E1010 Type 1 diabetes mellitus with ketoacidosis without coma: Secondary | ICD-10-CM | POA: Diagnosis present

## 2015-01-02 DIAGNOSIS — J9601 Acute respiratory failure with hypoxia: Secondary | ICD-10-CM | POA: Diagnosis present

## 2015-01-02 DIAGNOSIS — E1022 Type 1 diabetes mellitus with diabetic chronic kidney disease: Secondary | ICD-10-CM | POA: Diagnosis present

## 2015-01-02 DIAGNOSIS — N2581 Secondary hyperparathyroidism of renal origin: Secondary | ICD-10-CM | POA: Diagnosis present

## 2015-01-02 DIAGNOSIS — Z79899 Other long term (current) drug therapy: Secondary | ICD-10-CM

## 2015-01-02 DIAGNOSIS — E0811 Diabetes mellitus due to underlying condition with ketoacidosis with coma: Secondary | ICD-10-CM | POA: Diagnosis not present

## 2015-01-02 DIAGNOSIS — J9621 Acute and chronic respiratory failure with hypoxia: Secondary | ICD-10-CM | POA: Diagnosis present

## 2015-01-02 DIAGNOSIS — J9690 Respiratory failure, unspecified, unspecified whether with hypoxia or hypercapnia: Secondary | ICD-10-CM

## 2015-01-02 DIAGNOSIS — R509 Fever, unspecified: Secondary | ICD-10-CM

## 2015-01-02 DIAGNOSIS — E875 Hyperkalemia: Secondary | ICD-10-CM | POA: Diagnosis present

## 2015-01-02 DIAGNOSIS — K219 Gastro-esophageal reflux disease without esophagitis: Secondary | ICD-10-CM | POA: Diagnosis present

## 2015-01-02 DIAGNOSIS — K3184 Gastroparesis: Secondary | ICD-10-CM | POA: Diagnosis present

## 2015-01-02 DIAGNOSIS — Z880 Allergy status to penicillin: Secondary | ICD-10-CM | POA: Diagnosis not present

## 2015-01-02 DIAGNOSIS — Y95 Nosocomial condition: Secondary | ICD-10-CM | POA: Diagnosis present

## 2015-01-02 DIAGNOSIS — J810 Acute pulmonary edema: Secondary | ICD-10-CM | POA: Insufficient documentation

## 2015-01-02 DIAGNOSIS — E1310 Other specified diabetes mellitus with ketoacidosis without coma: Secondary | ICD-10-CM

## 2015-01-02 DIAGNOSIS — E0810 Diabetes mellitus due to underlying condition with ketoacidosis without coma: Secondary | ICD-10-CM | POA: Diagnosis not present

## 2015-01-02 DIAGNOSIS — J9600 Acute respiratory failure, unspecified whether with hypoxia or hypercapnia: Secondary | ICD-10-CM

## 2015-01-02 HISTORY — DX: Acute on chronic diastolic (congestive) heart failure: I50.33

## 2015-01-02 LAB — I-STAT ARTERIAL BLOOD GAS, ED
Acid-base deficit: 9 mmol/L — ABNORMAL HIGH (ref 0.0–2.0)
Acid-base deficit: 7 mmol/L — ABNORMAL HIGH (ref 0.0–2.0)
Bicarbonate: 17.8 mEq/L — ABNORMAL LOW (ref 20.0–24.0)
Bicarbonate: 19.8 meq/L — ABNORMAL LOW (ref 20.0–24.0)
O2 Saturation: 89 %
O2 Saturation: 99 %
Patient temperature: 98.6
Patient temperature: 98.5
TCO2: 19 mmol/L (ref 0–100)
TCO2: 21 mmol/L (ref 0–100)
pCO2 arterial: 41 mmHg (ref 35.0–45.0)
pCO2 arterial: 41.5 mmHg (ref 35.0–45.0)
pH, Arterial: 7.245 — ABNORMAL LOW (ref 7.350–7.450)
pH, Arterial: 7.286 — ABNORMAL LOW (ref 7.350–7.450)
pO2, Arterial: 66 mmHg — ABNORMAL LOW (ref 80.0–100.0)
pO2, Arterial: 183 mmHg — ABNORMAL HIGH (ref 80.0–100.0)

## 2015-01-02 LAB — CBC
HCT: 39.9 % (ref 36.0–46.0)
Hemoglobin: 11.9 g/dL — ABNORMAL LOW (ref 12.0–15.0)
MCH: 31.6 pg (ref 26.0–34.0)
MCHC: 29.8 g/dL — ABNORMAL LOW (ref 30.0–36.0)
MCV: 105.8 fL — ABNORMAL HIGH (ref 78.0–100.0)
Platelets: 127 10*3/uL — ABNORMAL LOW (ref 150–400)
RBC: 3.77 MIL/uL — ABNORMAL LOW (ref 3.87–5.11)
RDW: 15.4 % (ref 11.5–15.5)
WBC: 6.8 10*3/uL (ref 4.0–10.5)

## 2015-01-02 LAB — COMPREHENSIVE METABOLIC PANEL
ALT: 35 U/L (ref 14–54)
AST: 93 U/L — ABNORMAL HIGH (ref 15–41)
Albumin: 3.7 g/dL (ref 3.5–5.0)
Alkaline Phosphatase: 160 U/L — ABNORMAL HIGH (ref 38–126)
Anion gap: 18 — ABNORMAL HIGH (ref 5–15)
BUN: 50 mg/dL — ABNORMAL HIGH (ref 6–20)
CO2: 19 mmol/L — ABNORMAL LOW (ref 22–32)
Calcium: 9 mg/dL (ref 8.9–10.3)
Chloride: 83 mmol/L — ABNORMAL LOW (ref 101–111)
Creatinine, Ser: 9.38 mg/dL — ABNORMAL HIGH (ref 0.44–1.00)
GFR calc Af Amer: 5 mL/min — ABNORMAL LOW (ref >60)
GFR calc non Af Amer: 5 mL/min — ABNORMAL LOW (ref >60)
Glucose, Bld: 1225 mg/dL (ref 65–99)
Potassium: 6.5 mmol/L (ref 3.5–5.1)
Sodium: 120 mmol/L — ABNORMAL LOW (ref 135–145)
Total Bilirubin: 1.7 mg/dL — ABNORMAL HIGH (ref 0.3–1.2)
Total Protein: 7.2 g/dL (ref 6.5–8.1)

## 2015-01-02 LAB — CBG MONITORING, ED
Glucose-Capillary: >600 mg/dL (ref 65–99)
Glucose-Capillary: >600 mg/dL (ref 65–99)
Glucose-Capillary: >600 mg/dL (ref 65–99)
Glucose-Capillary: >600 mg/dL (ref 65–99)
Glucose-Capillary: >600 mg/dL (ref 65–99)

## 2015-01-02 LAB — BRAIN NATRIURETIC PEPTIDE: B Natriuretic Peptide: 2030.1 pg/mL — ABNORMAL HIGH (ref 0.0–100.0)

## 2015-01-02 LAB — TROPONIN I: Troponin I: <0.03 ng/mL (ref <0.031)

## 2015-01-02 LAB — MAGNESIUM: Magnesium: 2.2 mg/dL (ref 1.7–2.4)

## 2015-01-02 MED ORDER — NITROGLYCERIN 2 % TD OINT
1.0000 [in_us] | TOPICAL_OINTMENT | Freq: Once | TRANSDERMAL | Status: AC
  Administered 2015-01-02: 1 [in_us] via TOPICAL
  Filled 2015-01-02: qty 1

## 2015-01-02 MED ORDER — HEPARIN SODIUM (PORCINE) 5000 UNIT/ML IJ SOLN
5000.0000 [IU] | Freq: Three times a day (TID) | INTRAMUSCULAR | Status: DC
  Administered 2015-01-02 – 2015-01-06 (×9): 5000 [IU] via SUBCUTANEOUS
  Filled 2015-01-02 (×13): qty 1

## 2015-01-02 MED ORDER — SIMVASTATIN 20 MG PO TABS: 20.0000 mg | ORAL_TABLET | Freq: Every day | ORAL | Status: DC

## 2015-01-02 MED ORDER — ANTISEPTIC ORAL RINSE SOLUTION (CORINZ)
7.0000 mL | Freq: Four times a day (QID) | OROMUCOSAL | Status: DC
  Administered 2015-01-03 (×2): 7 mL via OROMUCOSAL

## 2015-01-02 MED ORDER — AMLODIPINE BESYLATE 5 MG PO TABS: 10.0000 mg | ORAL_TABLET | Freq: Every day | ORAL | Status: DC

## 2015-01-02 MED ORDER — HYDRALAZINE HCL 20 MG/ML IJ SOLN
10.0000 mg | Freq: Once | INTRAMUSCULAR | Status: AC
  Administered 2015-01-02: 10 mg via INTRAVENOUS
  Filled 2015-01-02: qty 1

## 2015-01-02 MED ORDER — LORAZEPAM 2 MG/ML IJ SOLN
1.0000 mg | Freq: Once | INTRAMUSCULAR | Status: AC
  Administered 2015-01-02: 1 mg via INTRAMUSCULAR
  Filled 2015-01-02: qty 1

## 2015-01-02 MED ORDER — DEXTROSE-NACL 5-0.45 % IV SOLN
INTRAVENOUS | Status: DC
  Administered 2015-01-03: 50 mL/h via INTRAVENOUS

## 2015-01-02 MED ORDER — ASPIRIN 325 MG PO TABS
325.0000 mg | ORAL_TABLET | Freq: Every day | ORAL | Status: DC
  Administered 2015-01-03 – 2015-01-07 (×4): 325 mg
  Filled 2015-01-02 (×5): qty 1

## 2015-01-02 MED ORDER — ASPIRIN 325 MG PO TABS: 325.0000 mg | ORAL_TABLET | Freq: Every day | ORAL | Status: DC

## 2015-01-02 MED ORDER — INSULIN REGULAR HUMAN 100 UNIT/ML IJ SOLN
INTRAMUSCULAR | Status: DC
  Filled 2015-01-02: qty 2.5

## 2015-01-02 MED ORDER — LIDOCAINE HCL (CARDIAC) 20 MG/ML IV SOLN
INTRAVENOUS | Status: AC
  Filled 2015-01-02: qty 5

## 2015-01-02 MED ORDER — FENTANYL CITRATE (PF) 100 MCG/2ML IJ SOLN
INTRAMUSCULAR | Status: AC
  Filled 2015-01-02: qty 2

## 2015-01-02 MED ORDER — CHLORHEXIDINE GLUCONATE 0.12% ORAL RINSE (MEDLINE KIT)
15.0000 mL | Freq: Two times a day (BID) | OROMUCOSAL | Status: DC
  Administered 2015-01-03: 15 mL via OROMUCOSAL

## 2015-01-02 MED ORDER — ROCURONIUM BROMIDE 50 MG/5ML IV SOLN
INTRAVENOUS | Status: AC
  Administered 2015-01-02: 50 mg
  Filled 2015-01-02: qty 2

## 2015-01-02 MED ORDER — METOPROLOL TARTRATE 25 MG/10 ML ORAL SUSPENSION
50.0000 mg | Freq: Two times a day (BID) | ORAL | Status: DC
  Administered 2015-01-04 – 2015-01-05 (×2): 50 mg
  Filled 2015-01-02 (×7): qty 20

## 2015-01-02 MED ORDER — PROPOFOL 1000 MG/100ML IV EMUL
0.0000 ug/kg/min | INTRAVENOUS | Status: DC
  Administered 2015-01-02: 5 ug/kg/min via INTRAVENOUS
  Administered 2015-01-03: 40 ug/kg/min via INTRAVENOUS
  Administered 2015-01-03: 35 ug/kg/min via INTRAVENOUS
  Administered 2015-01-03: 50 ug/kg/min via INTRAVENOUS
  Administered 2015-01-03: 40 ug/kg/min via INTRAVENOUS
  Filled 2015-01-02 (×7): qty 100

## 2015-01-02 MED ORDER — VANCOMYCIN HCL 10 G IV SOLR
1500.0000 mg | Freq: Once | INTRAVENOUS | Status: DC
  Filled 2015-01-02: qty 1500

## 2015-01-02 MED ORDER — SUCCINYLCHOLINE CHLORIDE 20 MG/ML IJ SOLN
INTRAMUSCULAR | Status: AC
  Filled 2015-01-02: qty 1

## 2015-01-02 MED ORDER — MIDAZOLAM HCL 2 MG/2ML IJ SOLN
5.0000 mg | Freq: Once | INTRAMUSCULAR | Status: AC
  Administered 2015-01-02: 5 mg via INTRAVENOUS

## 2015-01-02 MED ORDER — SODIUM CHLORIDE 0.9 % IV SOLN
25.0000 ug/h | INTRAVENOUS | Status: DC
  Administered 2015-01-02: 50 ug/h via INTRAVENOUS
  Administered 2015-01-03: 150 ug/h via INTRAVENOUS
  Administered 2015-01-04: 250 ug/h via INTRAVENOUS
  Filled 2015-01-02 (×3): qty 50

## 2015-01-02 MED ORDER — SODIUM CHLORIDE 0.9 % IV SOLN
INTRAVENOUS | Status: DC
  Administered 2015-01-02: 22:00:00 via INTRAVENOUS

## 2015-01-02 MED ORDER — HYDRALAZINE HCL 20 MG/ML IJ SOLN
10.0000 mg | INTRAMUSCULAR | Status: DC | PRN
  Administered 2015-01-02: 10 mg via INTRAVENOUS
  Filled 2015-01-02: qty 1

## 2015-01-02 MED ORDER — PANTOPRAZOLE SODIUM 40 MG PO PACK
40.0000 mg | PACK | ORAL | Status: DC
  Administered 2015-01-03 – 2015-01-04 (×2): 40 mg
  Filled 2015-01-02 (×4): qty 20

## 2015-01-02 MED ORDER — ALBUTEROL SULFATE (2.5 MG/3ML) 0.083% IN NEBU
2.5000 mg | INHALATION_SOLUTION | RESPIRATORY_TRACT | Status: DC | PRN
Start: 2015-01-02 — End: 2015-01-06

## 2015-01-02 MED ORDER — MIDAZOLAM HCL 2 MG/2ML IJ SOLN
INTRAMUSCULAR | Status: AC
  Filled 2015-01-02: qty 6

## 2015-01-02 MED ORDER — SODIUM CHLORIDE 0.9 % IV SOLN
1.0000 g | Freq: Once | INTRAVENOUS | Status: DC
  Filled 2015-01-02: qty 10

## 2015-01-02 MED ORDER — FENTANYL CITRATE (PF) 100 MCG/2ML IJ SOLN
100.0000 ug | Freq: Once | INTRAMUSCULAR | Status: AC
  Administered 2015-01-02: 100 ug via INTRAVENOUS

## 2015-01-02 MED ORDER — INSULIN ASPART 100 UNIT/ML ~~LOC~~ SOLN
10.0000 [IU] | Freq: Once | SUBCUTANEOUS | Status: AC
  Administered 2015-01-02: 10 [IU] via INTRAVENOUS
  Filled 2015-01-02: qty 1

## 2015-01-02 MED ORDER — SIMVASTATIN 20 MG PO TABS
20.0000 mg | ORAL_TABLET | Freq: Every day | ORAL | Status: DC
  Administered 2015-01-03 – 2015-01-06 (×4): 20 mg
  Filled 2015-01-02 (×6): qty 1

## 2015-01-02 MED ORDER — ONDANSETRON HCL 4 MG/2ML IJ SOLN
4.0000 mg | Freq: Four times a day (QID) | INTRAMUSCULAR | Status: DC | PRN
  Administered 2015-01-04: 4 mg via INTRAVENOUS
  Filled 2015-01-02: qty 2

## 2015-01-02 MED ORDER — ONDANSETRON HCL 4 MG/2ML IJ SOLN
4.0000 mg | Freq: Once | INTRAMUSCULAR | Status: AC
  Administered 2015-01-02: 4 mg via INTRAVENOUS
  Filled 2015-01-02: qty 2

## 2015-01-02 MED ORDER — SODIUM CHLORIDE 0.9 % IV BOLUS (SEPSIS)
1000.0000 mL | Freq: Once | INTRAVENOUS | Status: AC
  Administered 2015-01-02: 1000 mL via INTRAVENOUS

## 2015-01-02 MED ORDER — VANCOMYCIN HCL 10 G IV SOLR
1500.0000 mg | Freq: Once | INTRAVENOUS | Status: AC
  Administered 2015-01-03: 1500 mg via INTRAVENOUS
  Filled 2015-01-02: qty 1500

## 2015-01-02 MED ORDER — ONDANSETRON HCL 4 MG/2ML IJ SOLN
4.0000 mg | Freq: Once | INTRAMUSCULAR | Status: DC
  Filled 2015-01-02 (×2): qty 2

## 2015-01-02 MED ORDER — DEXTROSE 5 % IV SOLN
2.0000 g | Freq: Once | INTRAVENOUS | Status: DC
  Filled 2015-01-02: qty 2

## 2015-01-02 MED ORDER — SODIUM CHLORIDE 0.9 % IV SOLN
INTRAVENOUS | Status: DC
  Administered 2015-01-02: 5.4 [IU]/h via INTRAVENOUS
  Filled 2015-01-02: qty 2.5

## 2015-01-02 MED ORDER — FENTANYL BOLUS VIA INFUSION
50.0000 ug | INTRAVENOUS | Status: DC | PRN
  Filled 2015-01-02: qty 50

## 2015-01-02 MED ORDER — DEXTROSE 5 % IV SOLN
1.0000 g | INTRAVENOUS | Status: DC
  Administered 2015-01-03 – 2015-01-04 (×2): 1 g via INTRAVENOUS
  Filled 2015-01-02 (×3): qty 1

## 2015-01-02 MED ORDER — ETOMIDATE 2 MG/ML IV SOLN
INTRAVENOUS | Status: AC
Start: 2015-01-02 — End: 2015-01-02
  Administered 2015-01-02: 20 mg
  Filled 2015-01-02: qty 20

## 2015-01-02 MED ORDER — SODIUM BICARBONATE 8.4 % IV SOLN
25.0000 meq | Freq: Once | INTRAVENOUS | Status: DC
  Filled 2015-01-02: qty 50

## 2015-01-02 MED ORDER — AMLODIPINE BESYLATE 10 MG PO TABS
10.0000 mg | ORAL_TABLET | Freq: Every day | ORAL | Status: DC
  Administered 2015-01-05 – 2015-01-06 (×2): 10 mg
  Filled 2015-01-02 (×4): qty 1

## 2015-01-02 NOTE — ED Notes (Signed)
Pt split water all over bed.  Bed linens changed and patient repositioned.  New glass of water given to patient and warm blanket.  Call light in patient's reach.

## 2015-01-02 NOTE — ED Notes (Signed)
Central line set up. Dr Wilson Singer at bedside placing central line.

## 2015-01-02 NOTE — Procedures (Signed)
Central Venous Catheter Insertion Procedure Note [name] [id number] 1970-11-04  Procedure: Insertion of Central Venous Catheter Indications: Assessment of intravascular volume, Drug and/or fluid administration and Frequent blood sampling  Procedure Details Consent: Unable to obtain consent because of altered level of consciousness. Time Out: Verified patient identification, verified procedure, site/side was marked, verified correct patient position, special equipment/implants available, medications/allergies/relevent history reviewed, required imaging and test results available.  Performed  Maximum sterile technique was used including antiseptics, cap, gloves, gown, hand hygiene, mask and sheet. Skin prep: Chlorhexidine; local anesthetic administered A antimicrobial bonded/coated triple lumen catheter was placed in the left femoral vein due to patient being a dialysis patient using the Seldinger technique. Ultrasound guidance used.Yes.   Catheter placed to 20 cm. Blood aspirated via all 3 ports and then flushed x 3. Line sutured x 2 and dressing applied.  Evaluation Blood flow good Complications: No apparent complications Patient did tolerate procedure well. Chest X-ray ordered to verify placement.  CXR: not needed.  Richardson Landry Penina Reisner ACNP Mary[facility] Shape PCCM Pager [phone] till 3 pm If no answer page [phone] 01/02/2015, 10:07 PM

## 2015-01-02 NOTE — ED Notes (Signed)
Pt prepped for femoral line placement.

## 2015-01-02 NOTE — ED Notes (Signed)
Pt unable to tolerate drape for central line.

## 2015-01-02 NOTE — ED Notes (Signed)
Critical care NP and MDs at bedside to intubate and place central line.

## 2015-01-02 NOTE — ED Provider Notes (Signed)
CSN: [id number]     Arrival date & time 01/02/15  1639 History   First MD Initiated Contact with Patient 01/02/15 1640     Chief Complaint  Patient presents with  . Shortness of Breath     (Consider location/radiation/quality/duration/timing/severity/associated sxs/prior Treatment) HPI   44yF with hx of ESRD and diabetes with recurrent admissions presenting with dyspnea. Onset today and progressively worsening. Occasional nonproductive cough. Reports blood glucose earlier today was "good" with readings in "100s." "High" for EMS. Last dialysis on Friday. No fever or chills. Reports compliance with medications.   Past Medical History  Diagnosis Date  . Hyperlipidemia   . Alopecia   . Obesity   . Iron deficiency   . ESRD (end stage renal disease)     S/p pancreatic and kidney transplant, but back on HD 2008, dialysis at [facility], [place], Sat  . RLS (restless legs syndrome)   . Injury of conjunctiva and corneal abrasion of right eye without foreign body   . Cellulitis   . Anemia   . Blood transfusion 2004    "when I had my transplant"  . Migraine   . Hyperparathyroidism, secondary   . Diabetic retinopathy   . Hypertension     takes Metoprolol and Exforge daily  . Depression     takes Klonopin nightly  . Diabetes mellitus type 1     Pt states diagnosed at age 46 with prior episodes of DKA. S/p pancreatic transplant   . GERD (gastroesophageal reflux disease)     takes Protonix daily  . Bronchitis   . Migraine   . Asthma   . Emphysema of lung   . Angina     none in past 2 years.  . Stomach pain     Hx: of chronic  . Pneumonia 2012    "double"  . Dialysis patient     tues,thurs,sat  . 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  Surgery was done at [facility].     .  DKA (diabetic ketoacidoses) 12/01/2014   Past Surgical History  Procedure Laterality Date  . 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 Thyroglobulin      x 7  . Av fistula placement      right upper arm  . Cholecystectomy  1995  .  hd graft placement/removal      "had 2 in my left upper arm"  . Eye surgery    . Retinopathy surgery    . Tooth extraction  10/10/11    X's two  . Insertion of dialysis catheter  12/08/2011    Procedure: INSERTION OF DIALYSIS CATHETER;  Surgeon: Angelia Mould, MD;  Location: [place];  Service: Vascular;  Laterality: Left;  . Av fistula placement  01/22/2012    Procedure: INSERTION OF ARTERIOVENOUS (AV) GORE-TEX GRAFT ARM;  Surgeon: Angelia Mould, MD;  Location: [place];  Service: Vascular;  Laterality: Left;  Ultrasound guided.  . Av fistula placement  03/14/2012    Procedure: INSERTION OF ARTERIOVENOUS (AV) GORE-TEX GRAFT THIGH;  Surgeon: Angelia Mould, MD;  Location: [place];  Service: Vascular;  Laterality: Right;  . False aneurysm repair Right 01/02/2013    Procedure: Excision of lymphocele in right thigh.;  Surgeon: Harrell Gave  Nicole Cella, MD;  Location: [place];  Service: Vascular;  Laterality: Right;  . Carpal tunnel release Right 03/02/2013    Procedure: CARPAL TUNNEL RELEASE;  Surgeon: Tennis Must, MD;  Location: [place];  Service: Orthopedics;  Laterality: Right;  . Flexible sigmoidoscopy N/A 03/24/2013    Procedure: FLEXIBLE SIGMOIDOSCOPY;  Surgeon: Cleotis Nipper, MD;  Location: [facility] ENDOSCOPY;  Service: Endoscopy;  Laterality: N/A;  . Revision of arteriovenous goretex graft Right 04/02/2013    Procedure: REPAIR OF PSEUDOANEURYSM OF ARTERIOVENOUS GORETEX GRAFT;  Surgeon: Angelia Mould, MD;  Location: [place];  Service: Vascular;  Laterality: Right;  . Carpal tunnel release Left 10/16/2013    Procedure: LEFT CARPAL TUNNEL RELEASE;  Surgeon: Tennis Must, MD;  Location: [place];  Service: Orthopedics;  Laterality: Left;  . Unilateral upper extremeity angiogram N/A 11/12/2011    Procedure: UNILATERAL UPPER Anselmo Rod;  Surgeon: Angelia Mould, MD;  Location: [facility] CATH LAB;  Service: Cardiovascular;  Laterality: N/A;  . Fistulogram Left 03/03/2012    Procedure: FISTULOGRAM;  Surgeon: Angelia Mould, MD;  Location: [facility] CATH LAB;  Service: Cardiovascular;  Laterality: Left;   Family History  Problem Relation Age of Onset  . Hypertension Mother   . Kidney disease Mother   . Diabetes Mother   . Hypertension Father   . Kidney disease Father   . Diabetes Father    History  Substance Use Topics  . Smoking status: Never Smoker   . Smokeless tobacco: Never Used  . Alcohol Use: No   OB History    No data available     Review of Systems  All systems reviewed and negative, other than as noted in HPI.   Allergies  Depakote; Morphine and related; Penicillins; Tramadol; Imitrex; and Vicodin  Home Medications   Prior to Admission medications   Medication Sig Start Date End Date Taking? Authorizing Provider  albuterol (PROVENTIL HFA;VENTOLIN HFA) 108 (90 BASE) MCG/ACT inhaler Inhale 2 puffs into the lungs every 4 (four) hours as needed for wheezing or shortness of breath (as per home regimen). 02/20/13   Kinnie Feil, MD  aspirin 325 MG tablet Take 1 tablet (325 mg total) by mouth daily. 05/20/14   Velvet Bathe, MD  calcium acetate (PHOSLO) 667 MG capsule Take 1 capsule (667 mg total) by mouth 2 (two) times daily. Patient taking differently: Take 2,001 mg by mouth 3 (three) times daily with meals.  11/16/14   Theodis Blaze, MD  clonazePAM (KLONOPIN) 0.5 MG tablet Take 0.5 mg by mouth 2 (two) times daily.     Historical Provider, MD  Darbepoetin Alfa (ARANESP) 40 MCG/0.4ML SOSY injection Inject 0.4 mLs (40 mcg total) into the vein every Thursday with hemodialysis. 08/05/14   Geradine Girt, DO  doxercalciferol (HECTOROL) 4 MCG/2ML  injection Inject 1 mL (2 mcg total) into the vein Every Tuesday,Thursday,and Saturday with dialysis. 08/05/14   Geradine Girt, DO  doxycycline (VIBRA-TABS) 100 MG tablet Take 1 tablet (100 mg total) by mouth daily. 12/06/14   Kelvin Cellar, MD  gabapentin (NEURONTIN) 100 MG capsule Take 100 mg by mouth 2 (two) times daily.     Historical Provider, MD  insulin aspart (NOVOLOG) 100 UNIT/ML injection Inject 6 Units into the skin 2 (two) times daily. 12/06/14   Kelvin Cellar, MD  insulin glargine (LANTUS) 100 UNIT/ML injection Inject 0.07 mLs (7 Units total) into the skin 2 (two) times daily. 08/05/14   Janett Billow  U Vann, DO  lidocaine (LIDODERM) 5 % Place 1 patch onto the skin daily. Remove & Discard patch within 12 hours or as directed by MD 12/06/14   Kelvin Cellar, MD  metoCLOPramide (REGLAN) 10 MG tablet Take 1 tablet (10 mg total) by mouth 4 (four) times daily -  before meals and at bedtime. 12/06/14   Kelvin Cellar, MD  metoprolol succinate (TOPROL-XL) 100 MG 24 hr tablet Take 1 tablet (100 mg total) by mouth daily. Take with or immediately following a meal. Patient taking differently: Take 100 mg by mouth daily after supper. Take with or immediately following a meal. 05/20/14   Velvet Bathe, MD  montelukast (SINGULAIR) 10 MG tablet Take 10 mg by mouth at bedtime.    Historical Provider, MD  multivitamin (RENA-VIT) TABS tablet Take 1 tablet by mouth daily. 08/05/14   Geradine Girt, DO  Nutritional Supplements (FEEDING SUPPLEMENT, NEPRO CARB STEADY,) LIQD Take 237 mLs by mouth 2 (two) times daily between meals. 04/23/14   Orson Eva, MD  pantoprazole (PROTONIX) 40 MG tablet Take 1 tablet (40 mg total) by mouth at bedtime. 08/05/14   Geradine Girt, DO  promethazine (PHENERGAN) 25 MG tablet Take 1 tablet (25 mg total) by mouth every 6 (six) hours as needed for nausea or vomiting. 12/06/14   Kelvin Cellar, MD  simvastatin (ZOCOR) 20 MG tablet Take 1 tablet (20 mg total) by mouth at bedtime. 05/20/14    Velvet Bathe, MD  zolpidem (AMBIEN) 5 MG tablet Take 5 mg by mouth at bedtime as needed. sleep 02/09/14   Historical Provider, MD   BP 176/75 mmHg  Pulse 82  Temp(Src) 98.5 F (36.9 C) (Oral)  Resp 16  SpO2 100% Physical Exam  Constitutional: She appears well-developed and well-nourished. She appears distressed.  HENT:  Head: Normocephalic and atraumatic.  Eyes: Conjunctivae are normal. Right eye exhibits no discharge. Left eye exhibits no discharge.  Neck: Neck supple.  Cardiovascular: Normal rate, regular rhythm and normal heart sounds.  Exam reveals no gallop and no friction rub.   No murmur heard. R thigh AV graft with thrill  Pulmonary/Chest: Effort normal and breath sounds normal. No respiratory distress.  Tachypnea. Accessory muscle usage. Cannot speak in complete sentences. Decreased breath sounds b/l.   Abdominal: Soft. She exhibits no distension. There is no tenderness.  Musculoskeletal: She exhibits no edema or tenderness.  Neurological: She is alert.  Skin: Skin is warm and dry.  Psychiatric: She has a normal mood and affect. Her behavior is normal. Thought content normal.  Nursing note and vitals reviewed.   ED Course  Procedures (including critical care time)  Angiocath insertion Performed by: Virgel Manifold  Consent: Verbal consent obtained. Risks and benefits: risks, benefits and alternatives were discussed Time out: Immediately prior to procedure a "time out" was called to verify the correct patient, procedure, equipment, support staff and site/side marked as required.  Preparation: Patient was prepped and draped in the usual sterile fashion.  Vein Location: R EJ  Gauge: 20g  Normal blood return and flush without difficulty Patient tolerance: Patient tolerated the procedure well with no immediate complications.  CRITICAL CARE Performed by: Virgel Manifold Total critical care time: 60 minutes Critical care time was exclusive of separately billable  procedures and treating other patients. Critical care was necessary to treat or prevent imminent or life-threatening deterioration. Critical care was time spent personally by me on the following activities: development of treatment plan with patient and/or surrogate as well as nursing,  discussions with consultants, evaluation of patient's response to treatment, examination of patient, obtaining history from patient or surrogate, ordering and performing treatments and interventions, ordering and review of laboratory studies, ordering and review of radiographic studies, pulse oximetry and re-evaluation of patient's condition.   Labs Review Labs Reviewed  CBC - Abnormal; Notable for the following:    RBC 3.77 (*)    Hemoglobin 11.9 (*)    MCV 105.8 (*)    MCHC 29.8 (*)    Platelets 127 (*)    All other components within normal limits  CBG MONITORING, ED - Abnormal; Notable for the following:    Glucose-Capillary >600 (*)    All other components within normal limits  COMPREHENSIVE METABOLIC PANEL  BRAIN NATRIURETIC PEPTIDE  MAGNESIUM  TROPONIN I  I-STAT TROPOININ, ED  I-STAT ARTERIAL BLOOD GAS, ED    Imaging Review Dg Chest Portable 1 View  01/02/2015   CLINICAL DATA:  Short of breath. History of hypertension, diabetes, asthma and emphysema.  EXAM: PORTABLE CHEST - 1 VIEW  COMPARISON:  12/02/2014  FINDINGS: Since prior study, hazy opacities developed in the lower lungs. This obscures hemidiaphragms.  Cardiac silhouette is mildly enlarged. No mediastinal or masses or adenopathy are noted.  No pneumothorax.  Skeletal structures are unremarkable.  IMPRESSION: 1. There is lower lung zone airspace opacity, new since the prior chest radiograph. This may reflect bilateral pneumonia or pulmonary edema. There are likely associated pleural effusions.   Electronically Signed   By: Lajean Manes M.D.   On: 01/02/2015 17:39     EKG Interpretation   Date/Time:  Sunday January 02 2015 16:52:55  EDT Ventricular Rate:  81 PR Interval:  227 QRS Duration: 100 QT Interval:  422 QTC Calculation: 490 R Axis:   99 Text Interpretation:  Sinus rhythm Prolonged PR interval Left atrial  enlargement Borderline right axis deviation consider hyperkalemia  Confirmed by Wilson Singer  MD, Neeko Pharo ([id number]) on 01/02/2015 5:28:32 PM      MDM   Final diagnoses:  Diabetic ketoacidosis associated with other specified diabetes mellitus  ESRD (end stage renal disease)  Hyperkalemia  HCAP (healthcare-associated pneumonia)    44yF with respiratory distress and hyperglycemia. EKG with peaked t-waves and appearance similar to prior EKG on 7/6 when presented then with potassium of 6.5. Difficult IV access. R EJ placed after several attempts peripherally with [place]. Insulin, bicarb, calcium empirically.  Wouldn't be surprised if in DKA and need insulin gtt. Imaging as above. PCN allergy. Abx to cover for possible HCAP with admission last month. Os sats now in mid 90s on 4L [place]. Very hypertensive with systolic BP as high as 284X and diastolic as high as 32G. Anticipate issues with compatibility of nitroglycerin gtt with all other IV meds will be needing. Need to work on additional IV access. Hydralazine and nitro paste for now. Will discuss with nephrology. Will need medical admission.   Discussed with Dr Posey Pronto, nephrology.   EJ blown. Tried to place IJ central line. Pt just would not tolerate. Kept pulling drape and reaching for area. Given ativan. Nursing restraining arms and providing reassurance. Had to abort before actually attempted to stick her because she eventually pulled down drape and contaminated field. IV team paged and able to obtain small peripheral. Appreciate CCM help in getting better access. Admission to medicine.      Virgel Manifold, MD 01/09/15 1141

## 2015-01-02 NOTE — ED Notes (Signed)
IV team at bedside 

## 2015-01-02 NOTE — H&P (Signed)
PULMONARY / CRITICAL CARE MEDICINE   Name: [name] MRN: [id number] DOB: 12-05-1970    ADMISSION DATE:  01/02/2015  REFERRING MD :  ER  CHIEF COMPLAINT:  Alerted mental status  INITIAL PRESENTATION:  44 yo female presented to ER with dyspnea, altered mental status, hyperglycemia, hyperkalemia, pulmonary edema, possible PNA.  She has hx of ESRD on HD.  Intubated in ER.  STUDIES:   SIGNIFICANT EVENTS: 8/07 Admit, insulin gtt started, renal consulted, intubated in ER   HISTORY OF PRESENT ILLNESS:   44 yo female with hx of ESRD after failed renal/pancreatic transplant had her HD day changed last week to accommodate her daughter's birthday >> normally Saturday, but had her date changed to Friday >> only ran for 2.75 hours instead of 3.75 hrs, and left 0.9 kg above her dry weight.  She presented to the ER with confusion, dyspnea.  She was found to have hyperkalemia, hyperglycemia, pulmonary edema, possible pneumonia.  She was difficulty IV access with multiple attempts by ER staff for CVL placement.  She has her AV graft in her Rt groin.  PCCM asked to assess for respiratory failure.  She required intubation in ER.  PAST MEDICAL HISTORY :   has a past medical history of Hyperlipidemia; Alopecia; Obesity; Iron deficiency; ESRD (end stage renal disease); RLS (restless legs syndrome); Injury of conjunctiva and corneal abrasion of right eye without foreign body; Cellulitis; Anemia; Blood transfusion (2004); Migraine; Hyperparathyroidism, secondary; Diabetic retinopathy; Hypertension; Depression; Diabetes mellitus type 1; GERD (gastroesophageal reflux disease); Bronchitis; Migraine; Asthma; Emphysema of lung; Angina; Stomach pain; Pneumonia (2012); Dialysis patient; History of simultaneous kidney and pancreas transplant (08/01/2011); DKA (diabetic ketoacidoses) (26/71/2458); and Diastolic CHF, acute on chronic (01/02/2015).  has past surgical history that includes Combined kidney-pancreas transplant  (08/16/2002); thyroglobulin; AV fistula placement; Cholecystectomy (1995);  HD graft placement/removal; Eye surgery; Retinopathy surgery; Tooth extraction (10/10/11); Insertion of dialysis catheter (12/08/2011); AV fistula placement (01/22/2012); AV fistula placement (03/14/2012); False aneurysm repair (Right, 01/02/2013); Carpal tunnel release (Right, 03/02/2013); Flexible sigmoidoscopy (N/A, 03/24/2013); Revision of arteriovenous goretex graft (Right, 04/02/2013); Carpal tunnel release (Left, 10/16/2013); unilateral upper extremeity angiogram (N/A, 11/12/2011); and Fistulogram (Left, 03/03/2012). Prior to Admission medications   Medication Sig Start Date End Date Taking? Authorizing Provider  albuterol (PROVENTIL HFA;VENTOLIN HFA) 108 (90 BASE) MCG/ACT inhaler Inhale 2 puffs into the lungs every 4 (four) hours as needed for wheezing or shortness of breath (as per home regimen). 02/20/13   Kinnie Feil, MD  amLODipine (NORVASC) 10 MG tablet Take 10 mg by mouth daily. 11/17/14   Historical Provider, MD  aspirin 325 MG tablet Take 1 tablet (325 mg total) by mouth daily. 05/20/14   Velvet Bathe, MD  calcium acetate (PHOSLO) 667 MG capsule Take 1 capsule (667 mg total) by mouth 2 (two) times daily. Patient taking differently: Take 2,001 mg by mouth 3 (three) times daily with meals.  11/16/14   Theodis Blaze, MD  clonazePAM (KLONOPIN) 0.5 MG tablet Take 0.5 mg by mouth 2 (two) times daily.     Historical Provider, MD  Darbepoetin Alfa (ARANESP) 40 MCG/0.4ML SOSY injection Inject 0.4 mLs (40 mcg total) into the vein every Thursday with hemodialysis. 08/05/14   Geradine Girt, DO  doxercalciferol (HECTOROL) 4 MCG/2ML injection Inject 1 mL (2 mcg total) into the vein Every Tuesday,Thursday,and Saturday with dialysis. 08/05/14   Geradine Girt, DO  gabapentin (NEURONTIN) 100 MG capsule Take 100 mg by mouth 2 (two) times daily.  Historical Provider, MD  HUMALOG KWIKPEN 100 UNIT/ML KiwkPen Inject 6 Units into the skin 2  (two) times daily as needed. Sliding Scale 11/17/14   Historical Provider, MD  insulin aspart (NOVOLOG) 100 UNIT/ML injection Inject 6 Units into the skin 2 (two) times daily. 12/06/14   Kelvin Cellar, MD  insulin glargine (LANTUS) 100 UNIT/ML injection Inject 0.07 mLs (7 Units total) into the skin 2 (two) times daily. 08/05/14   Geradine Girt, DO  lidocaine (LIDODERM) 5 % Place 1 patch onto the skin daily. Remove & Discard patch within 12 hours or as directed by MD 12/06/14   Kelvin Cellar, MD  metoCLOPramide (REGLAN) 10 MG tablet Take 1 tablet (10 mg total) by mouth 4 (four) times daily -  before meals and at bedtime. 12/06/14   Kelvin Cellar, MD  metoprolol succinate (TOPROL-XL) 100 MG 24 hr tablet Take 1 tablet (100 mg total) by mouth daily. Take with or immediately following a meal. Patient taking differently: Take 100 mg by mouth daily after supper. Take with or immediately following a meal. 05/20/14   Velvet Bathe, MD  montelukast (SINGULAIR) 10 MG tablet Take 10 mg by mouth at bedtime.    Historical Provider, MD  multivitamin (RENA-VIT) TABS tablet Take 1 tablet by mouth daily. 08/05/14   Geradine Girt, DO  Nutritional Supplements (FEEDING SUPPLEMENT, NEPRO CARB STEADY,) LIQD Take 237 mLs by mouth 2 (two) times daily between meals. 04/23/14   Orson Eva, MD  Oxycodone HCl 10 MG TABS Take 10 mg by mouth 3 (three) times daily as needed (for pain).  12/15/14   Historical Provider, MD  pantoprazole (PROTONIX) 40 MG tablet Take 1 tablet (40 mg total) by mouth at bedtime. 08/05/14   Geradine Girt, DO  promethazine (PHENERGAN) 25 MG tablet Take 1 tablet (25 mg total) by mouth every 6 (six) hours as needed for nausea or vomiting. 12/06/14   Kelvin Cellar, MD  simvastatin (ZOCOR) 20 MG tablet Take 1 tablet (20 mg total) by mouth at bedtime. 05/20/14   Velvet Bathe, MD  zolpidem (AMBIEN) 5 MG tablet Take 5 mg by mouth at bedtime as needed. sleep 02/09/14   Historical Provider, MD   Allergies  Allergen  Reactions  . Depakote [Divalproex Sodium] Other (See Comments)    Pt gets "delirious"  . Morphine And Related Nausea And Vomiting and Other (See Comments)    "makes me delirious"  . Penicillins Anaphylaxis    Tolerated Zosyn Dec 2014  . Tramadol Nausea And Vomiting  . Imitrex [Sumatriptan] Other (See Comments)    seizures  . Vicodin [Hydrocodone-Acetaminophen] Itching and Rash    FAMILY HISTORY:  indicated that her mother is deceased. She indicated that her father is alive.  SOCIAL HISTORY:  reports that she has never smoked. She has never used smokeless tobacco. She reports that she does not drink alcohol or use illicit drugs.  REVIEW OF SYSTEMS:   Unable to obtain  SUBJECTIVE:   VITAL SIGNS: Temp:  [98.5 F (36.9 C)] 98.5 F (36.9 C) (08/07 1649) Pulse Rate:  [79-95] 90 (08/07 2105) Resp:  [16-35] 24 (08/07 2105) BP: (152-201)/(66-94) 157/84 mmHg (08/07 2105) SpO2:  [89 %-100 %] 100 % (08/07 2105) FiO2 (%):  [100 %] 100 % (08/07 2111) HEMODYNAMICS:   VENTILATOR SETTINGS: Vent Mode:  [-] PRVC FiO2 (%):  [100 %] 100 % Set Rate:  [24 bmp] 24 bmp Vt Set:  [440 mL] 440 mL PEEP:  [5 cmH20] 5 cmH20 Plateau Pressure:  [  [place] / OUTPUT: No intake or output data in the 24 hours ending 01/02/15 2115  PHYSICAL EXAMINATION: General: ill appearing Neuro:  Somnolent, confused, randomly moves extremities HEENT:  Dry mucosa Cardiovascular:  Regular, tachycardic Lungs:  B/l crackles Abdomen:  Soft, non tender, decreased bowel sounds Musculoskeletal:  Decreased muscle bulk, no edema, AV graft Rt groin Skin:  No rashes  LABS:  CBC  Recent Labs Lab 01/02/15 1727  WBC 6.8  HGB 11.9*  HCT 39.9  PLT 127*   Coag's No results for input(s): APTT, INR in the last 168 hours.   BMET  Recent Labs Lab 01/02/15 1727  NA 120*  K 6.5*  CL 83*  CO2 19*  BUN 50*  CREATININE 9.38*  GLUCOSE 1225*   Electrolytes  Recent Labs Lab 01/02/15 1727  01/02/15 1750  CALCIUM 9.0  --   MG  --  2.2   Sepsis Markers No results for input(s): LATICACIDVEN, PROCALCITON, O2SATVEN in the last 168 hours.   ABG  Recent Labs Lab 01/02/15 1748  PHART 7.245*  PCO2ART 41.0  PO2ART 66.0*   Liver Enzymes  Recent Labs Lab 01/02/15 1727  AST 93*  ALT 35  ALKPHOS 160*  BILITOT 1.7*  ALBUMIN 3.7   Cardiac Enzymes  Recent Labs Lab 01/02/15 1727  TROPONINI <0.03   Glucose  Recent Labs Lab 01/02/15 1652 01/02/15 1936 01/02/15 2105  GLUCAP >600* >600* >600*    Imaging Dg Chest Portable 1 View  01/02/2015   CLINICAL DATA:  Short of breath. History of hypertension, diabetes, asthma and emphysema.  EXAM: PORTABLE CHEST - 1 VIEW  COMPARISON:  12/02/2014  FINDINGS: Since prior study, hazy opacities developed in the lower lungs. This obscures hemidiaphragms.  Cardiac silhouette is mildly enlarged. No mediastinal or masses or adenopathy are noted.  No pneumothorax.  Skeletal structures are unremarkable.  IMPRESSION: 1. There is lower lung zone airspace opacity, new since the prior chest radiograph. This may reflect bilateral pneumonia or pulmonary edema. There are likely associated pleural effusions.   Electronically Signed   By: Lajean Manes M.D.   On: 01/02/2015 17:39     ASSESSMENT / PLAN:  PULMONARY ETT 8/07 >>  A: Acute respiratory failure in setting of DKA, pulmonary edema, and possible PNA. Hx of asthma. P:   Full vent support F/u CXR, ABG Prn BDs Hold outpt singulair  CARDIOVASCULAR  A:  Hx of HTN, HLD, chronic diastolic heart failure. Difficult IV access. P:  Continue amlodipine, metoprolol, ASA, simvastatin Prn hydralazine for SBP > 180  RENAL A:   ESRD on HD T/Th/Sat, with hx of failed renal/pancreatic transplant. Hyperkalemia. Hyponatremia. Metabolic acidosis 2nd to DKA. P:   HD per renal F/u Renal panel  GASTROINTESTINAL A:   Nutrition. Hx of GERD, gastroparesis. Nausea. P:   Tube feeds once  DKA improved Protonix for SUP Prn zofran Might need to resume outpt reglan when she starts tube feeds  HEMATOLOGIC A:   Anemia of chronic disease. P:  F/u CBC SQ heparin for DVT prophylaxis  INFECTIOUS A:   Possible pneumonia. P:   Day 1 vancomycin, azactam >> if infiltrates clear with HD, then can likely d/c Abx soon  Blood 8/07 >> Sputum 8/07 >>  ENDOCRINE A:   DKA. Hx of DM type 1 with retinopathy, neuropathy. 2nd hyperparathyroidism. P:   Insulin gtt Add dextrose to IV fluid when blood sugar < 250  NEUROLOGIC A:   Acute metabolic encephalopathy. Hx of RLS, migraine  HA, depression P:   RASS goal: -1 Hold outpt klonopin, neurontin, oxycodone, ambien   No family at bedside.  CC time 45 minutes.  [name], MD [facility] Pulmonary/Critical Care 01/02/2015, 9:32 PM Pager:  [phone] After 3pm call: [phone]

## 2015-01-02 NOTE — ED Notes (Signed)
Central line setup. CCNP at bedside preparing to place central line.

## 2015-01-02 NOTE — Consult Note (Signed)
[facility] Initial Consult Note   [name]  UUV:[id number]  DOB: [birth year]  DOA: 01/02/2015 DOS: the patient was seen and examined on 01/02/2015  PCP: [name], MD   Referring physician: Dr Wilson Singer Reason for consult:  Admission  HPI: [name] is a 44 y.o. female with Past medical history of ESRD on hemodialysis with right AV graft on the thigh which was recently bleeding, dyslipidemia, morbid obesity, iron deficiency anemia, restless leg syndrome, hypertension, diabetes mellitus type 1, GERD. Patient is coming from home. The history is limited due to respiratory distress. The patient mentions that she is compliant with the hemodialysis also she is compliant with her regular medications there is no fever no chills she has been having some cough with expectoration she complains of chest pain. She denies any abdominal pain.  Review of Systems: as mentioned in the history of present illness.  A Comprehensive review of the other systems is negative.  Past Medical History  Diagnosis Date  . Hyperlipidemia   . Alopecia   . Obesity   . Iron deficiency   . ESRD (end stage renal disease)     S/p pancreatic and kidney transplant, but back on HD 2008, dialysis at [facility], [place], Sat  . RLS (restless legs syndrome)   . Injury of conjunctiva and corneal abrasion of right eye without foreign body   . Cellulitis   . Anemia   . Blood transfusion 2004    "when I had my transplant"  . Migraine   . Hyperparathyroidism, secondary   . Diabetic retinopathy   . Hypertension     takes Metoprolol and Exforge daily  . Depression     takes Klonopin nightly  . Diabetes mellitus type 1     Pt states diagnosed at age 78 with prior episodes of DKA. S/p pancreatic transplant   . GERD (gastroesophageal reflux disease)     takes Protonix daily  . Bronchitis   . Migraine   . Asthma   . Emphysema of lung   . Angina     none in past 2 years.  . Stomach pain      Hx: of chronic  . Pneumonia 2012    "double"  . Dialysis patient     tues,thurs,sat  . 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  Surgery was done at [facility].     . DKA (diabetic ketoacidoses) 12/01/2014   Past Surgical History  Procedure Laterality Date  . 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 Thyroglobulin      x 7  . Av fistula placement      right upper arm  . Cholecystectomy  1995  .  hd graft placement/removal      "had 2 in my left upper arm"  . Eye surgery    . Retinopathy surgery    . Tooth extraction  10/10/11    X's two  . Insertion of dialysis catheter  12/08/2011    Procedure: INSERTION OF DIALYSIS CATHETER;  Surgeon: Angelia Mould, MD;  Location: [place];  Service: Vascular;  Laterality: Left;  . Av fistula placement  01/22/2012    Procedure: INSERTION OF ARTERIOVENOUS (AV) GORE-TEX GRAFT ARM;  Surgeon: Angelia Mould, MD;  Location: [facility];  Service: Vascular;  Laterality: Left;  Ultrasound guided.  . Av fistula placement  03/14/2012    Procedure: INSERTION OF ARTERIOVENOUS (AV) GORE-TEX GRAFT THIGH;  Surgeon: Angelia Mould, MD;  Location: [place];  Service: Vascular;  Laterality: Right;  . False aneurysm repair Right 01/02/2013    Procedure: Excision of lymphocele in right thigh.;  Surgeon: Angelia Mould, MD;  Location: [place];  Service: Vascular;  Laterality: Right;  . Carpal tunnel release Right 03/02/2013    Procedure: CARPAL TUNNEL RELEASE;  Surgeon: Tennis Must, MD;  Location: [place];  Service: Orthopedics;  Laterality: Right;  . Flexible sigmoidoscopy N/A 03/24/2013    Procedure: FLEXIBLE SIGMOIDOSCOPY;  Surgeon: Cleotis Nipper, MD;  Location: [facility] ENDOSCOPY;  Service:  Endoscopy;  Laterality: N/A;  . Revision of arteriovenous goretex graft Right 04/02/2013    Procedure: REPAIR OF PSEUDOANEURYSM OF ARTERIOVENOUS GORETEX GRAFT;  Surgeon: Angelia Mould, MD;  Location: [place];  Service: Vascular;  Laterality: Right;  . Carpal tunnel release Left 10/16/2013    Procedure: LEFT CARPAL TUNNEL RELEASE;  Surgeon: Tennis Must, MD;  Location: [place];  Service: Orthopedics;  Laterality: Left;  . Unilateral upper extremeity angiogram N/A 11/12/2011    Procedure: UNILATERAL UPPER Anselmo Rod;  Surgeon: Angelia Mould, MD;  Location: [facility] CATH LAB;  Service: Cardiovascular;  Laterality: N/A;  . Fistulogram Left 03/03/2012    Procedure: FISTULOGRAM;  Surgeon: Angelia Mould, MD;  Location: [facility] CATH LAB;  Service: Cardiovascular;  Laterality: Left;   Social History:  reports that she has never smoked. She has never used smokeless tobacco. She reports that she does not drink alcohol or use illicit drugs.  Allergies  Allergen Reactions  . Depakote [Divalproex Sodium] Other (See Comments)    Pt gets "delirious"  . Morphine And Related Nausea And Vomiting and Other (See Comments)    "makes me delirious"  . Penicillins Anaphylaxis    Tolerated Zosyn Dec 2014  . Tramadol Nausea And Vomiting  . Imitrex [Sumatriptan] Other (See Comments)    seizures  . Vicodin [Hydrocodone-Acetaminophen] Itching and Rash    Family History  Problem Relation Age of Onset  . Hypertension Mother   . Kidney disease Mother   . Diabetes Mother   . Hypertension Father   . Kidney disease Father   . Diabetes Father     Prior to Admission medications   Medication Sig Start Date End Date Taking? Authorizing Provider  albuterol (PROVENTIL HFA;VENTOLIN HFA) 108 (90 BASE) MCG/ACT inhaler Inhale 2 puffs into the lungs every 4 (four) hours as needed for wheezing or shortness of breath (as per home regimen). 02/20/13   Kinnie Feil, MD  amLODipine  (NORVASC) 10 MG tablet Take 10 mg by mouth daily. 11/17/14   Historical Provider, MD  aspirin 325 MG tablet Take 1 tablet (325 mg total) by mouth daily. 05/20/14   Velvet Bathe, MD  calcium acetate (PHOSLO) 667 MG capsule Take 1 capsule (667 mg total) by mouth 2 (two) times daily. Patient taking differently: Take 2,001 mg by mouth 3 (three) times daily with meals.  11/16/14   Theodis Blaze, MD  clonazePAM (KLONOPIN) 0.5 MG tablet Take 0.5 mg by mouth 2 (two) times daily.     Historical Provider, MD  Darbepoetin Alfa (ARANESP) 40 MCG/0.4ML SOSY injection Inject 0.4 mLs (40 mcg total) into the vein every Thursday with hemodialysis. 08/05/14   Geradine Girt, DO  doxercalciferol (HECTOROL) 4 MCG/2ML injection Inject 1 mL (2 mcg total) into the vein Every Tuesday,Thursday,and Saturday with dialysis. 08/05/14   Geradine Girt, DO  doxycycline (VIBRA-TABS) 100 MG tablet Take 1 tablet (100 mg total) by mouth daily. 12/06/14   Kelvin Cellar, MD  gabapentin (NEURONTIN) 100 MG capsule Take 100 mg by mouth 2 (two) times daily.     Historical Provider, MD  HUMALOG KWIKPEN 100 UNIT/ML KiwkPen Inject 6 Units into the skin 2 (two) times daily as needed. Sliding Scale 11/17/14   Historical Provider, MD  insulin aspart (NOVOLOG) 100 UNIT/ML injection Inject 6 Units into the skin 2 (two) times daily. 12/06/14   Kelvin Cellar, MD  insulin glargine (LANTUS) 100 UNIT/ML injection Inject 0.07 mLs (7 Units total) into the skin 2 (two) times daily. 08/05/14   Geradine Girt, DO  lidocaine (LIDODERM) 5 % Place 1 patch onto the skin daily. Remove & Discard patch within 12 hours or as directed by MD 12/06/14   Kelvin Cellar, MD  metoCLOPramide (REGLAN) 10 MG tablet Take 1 tablet (10 mg total) by mouth 4 (four) times daily -  before meals and at bedtime. 12/06/14   Kelvin Cellar, MD  metoprolol succinate (TOPROL-XL) 100 MG 24 hr tablet Take 1 tablet (100 mg total) by mouth daily. Take with or immediately following a meal. Patient  taking differently: Take 100 mg by mouth daily after supper. Take with or immediately following a meal. 05/20/14   Velvet Bathe, MD  montelukast (SINGULAIR) 10 MG tablet Take 10 mg by mouth at bedtime.    Historical Provider, MD  multivitamin (RENA-VIT) TABS tablet Take 1 tablet by mouth daily. 08/05/14   Geradine Girt, DO  Nutritional Supplements (FEEDING SUPPLEMENT, NEPRO CARB STEADY,) LIQD Take 237 mLs by mouth 2 (two) times daily between meals. 04/23/14   Orson Eva, MD  Oxycodone HCl 10 MG TABS Take 10 mg by mouth 3 (three) times daily as needed (for pain).  12/15/14   Historical Provider, MD  pantoprazole (PROTONIX) 40 MG tablet Take 1 tablet (40 mg total) by mouth at bedtime. 08/05/14   Geradine Girt, DO  promethazine (PHENERGAN) 25 MG tablet Take 1 tablet (25 mg total) by mouth every 6 (six) hours as needed for nausea or vomiting. 12/06/14   Kelvin Cellar, MD  simvastatin (ZOCOR) 20 MG tablet Take 1 tablet (20 mg total) by mouth at bedtime. 05/20/14   Velvet Bathe, MD  zolpidem (AMBIEN) 5 MG tablet Take 5 mg by mouth at bedtime as needed. sleep 02/09/14   Historical Provider, MD    Physical Exam: Filed Vitals:   01/02/15 1930 01/02/15 1945 01/02/15 2000 01/02/15 2015  BP: 181/94 165/86 182/80 185/94  Pulse: 87 93 93 94  Temp:      TempSrc:      Resp: 27 32 35 27  SpO2: 100% 93% 92% 93%    General: Significantly lethargic and Appear in severe distress Eyes: PERRL ENT: Oral Mucosa clear, moist. Neck: no JVD, no Carotid Bruits  Cardiovascular: S1 and S2 Present, no Murmur, Peripheral Pulses Present Respiratory: Bilateral Air entry equal and Decreased,  no Crackles,no wheezes Abdomen: Bowel Sound Present, Soft and Non tender Skin: no Rash Extremities: Bilateral Pedal edema,  Neurologic: Lethargic   Labs:  CBC:  Recent Labs Lab 01/02/15 1727  WBC 6.8  HGB 11.9*  HCT 39.9  MCV 105.8*  PLT 564*   Basic Metabolic Panel:  Recent Labs Lab 01/02/15 1727 01/02/15 1750  NA 120*  --   K 6.5*  --   CL 83*  --   CO2 19*  --   GLUCOSE 1225*  --   BUN 50*  --   CREATININE 9.38*  --   CALCIUM 9.0  --   MG  --  2.2   Liver Function Tests:  Recent Labs Lab 01/02/15 1727  AST 93*  ALT 35  ALKPHOS 160*  BILITOT 1.7*  PROT 7.2  ALBUMIN 3.7   No results for input(s): LIPASE, AMYLASE in the last 168 hours. No results for input(s): AMMONIA in the last 168 hours.  Cardiac Enzymes:  Recent Labs Lab 01/02/15 1727  TROPONINI <0.03    BNP (last 3 results)  Recent Labs  01/14/14 0216  PROBNP 17541.0*   CBG:  Recent Labs Lab 01/02/15 1652 01/02/15 1936  GLUCAP >600* >600*    Radiological Exams: Dg Chest Portable 1 View  01/02/2015   CLINICAL DATA:  Short of breath. History of hypertension, diabetes, asthma and emphysema.  EXAM: PORTABLE CHEST - 1 VIEW  COMPARISON:  12/02/2014  FINDINGS: Since prior study, hazy opacities developed in the lower lungs. This obscures hemidiaphragms.  Cardiac silhouette is mildly enlarged. No mediastinal or masses or adenopathy are noted.  No pneumothorax.  Skeletal structures are unremarkable.  IMPRESSION: 1. There is lower lung zone airspace opacity, new since the prior chest radiograph. This may reflect bilateral pneumonia or pulmonary edema. There are likely associated pleural effusions.   Electronically Signed   By: Lajean Manes M.D.   On: 01/02/2015 17:39    EKG: Independently reviewed. sinus tachycardia.  Assessment/Plan Principal Problem:   DKA (diabetic ketoacidoses) Active Problems:   GERD (gastroesophageal reflux disease)   RLS (restless legs syndrome)   Diabetic retinopathy   End-stage renal disease on hemodialysis   Hyperkalemia   HTN (hypertension)   Diastolic CHF, acute on chronic   Acute respiratory failure with hypoxia   Lethargy    1. Acute respiratory failure.  The patient is significantly lethargic on 6 L of oxygen at the time of my evaluation. ED cannot lay the patient for that  can do a central line. The patient is in severe respiratory distress with significantly increased work of breathing. With this patient requires urgent hemodialysis which is currently ordered. Discussed with critical care, who has accepted the patient to the service. Highly appreciate their help in managing this patient.  Thank you very much for involving [place] in care of your patient.  We will sign off at present, please call us again as needed.  Author: Berle Mull, MD [facility] Pager: [phone] 01/02/2015 8:28 PM    If 7PM-7AM, please contact night-coverage [email or web address] Password [email or web address]

## 2015-01-02 NOTE — ED Notes (Signed)
RN and EDP attempting IV access. U/S in use by EDP. Pt with poor vasculature.

## 2015-01-02 NOTE — Progress Notes (Addendum)
ANTIBIOTIC CONSULT NOTE - INITIAL  Pharmacy Consult for vancomycin Indication: pneumonia  Allergies  Allergen Reactions  . Depakote [Divalproex Sodium] Other (See Comments)    Pt gets "delirious"  . Morphine And Related Nausea And Vomiting and Other (See Comments)    "makes me delirious"  . Penicillins Anaphylaxis    Tolerated Zosyn Dec 2014  . Tramadol Nausea And Vomiting  . Imitrex [Sumatriptan] Other (See Comments)    seizures  . Vicodin [Hydrocodone-Acetaminophen] Itching and Rash    Patient Measurements:     Vital Signs: Temp: 98.5 F (36.9 C) (08/07 1649) Temp Source: Oral (08/07 1649) BP: 176/75 mmHg (08/07 1649) Pulse Rate: 82 (08/07 1649) Intake/Output from previous day:   Intake/Output from this shift:    Labs:  Recent Labs  01/02/15 1727  WBC 6.8  HGB 11.9*  PLT 127*   CrCl cannot be calculated (Patient has no serum creatinine result on file.). No results for input(s): VANCOTROUGH, VANCOPEAK, VANCORANDOM, GENTTROUGH, GENTPEAK, GENTRANDOM, TOBRATROUGH, TOBRAPEAK, TOBRARND, AMIKACINPEAK, AMIKACINTROU, AMIKACIN in the last 72 hours.   Microbiology: No results found for this or any previous visit (from the past 720 hour(s)).  Medical History: Past Medical History  Diagnosis Date  . Hyperlipidemia   . Alopecia   . Obesity   . Iron deficiency   . ESRD (end stage renal disease)     S/p pancreatic and kidney transplant, but back on HD 2008, dialysis at [facility], [place], Sat  . RLS (restless legs syndrome)   . Injury of conjunctiva and corneal abrasion of right eye without foreign body   . Cellulitis   . Anemia   . Blood transfusion 2004    "when I had my transplant"  . Migraine   . Hyperparathyroidism, secondary   . Diabetic retinopathy   . Hypertension     takes Metoprolol and Exforge daily  . Depression     takes Klonopin nightly  . Diabetes mellitus type 1     Pt states diagnosed at age 44 with prior episodes of DKA. S/p  pancreatic transplant   . GERD (gastroesophageal reflux disease)     takes Protonix daily  . Bronchitis   . Migraine   . Asthma   . Emphysema of lung   . Angina     none in past 2 years.  . Stomach pain     Hx: of chronic  . Pneumonia 2012    "double"  . Dialysis patient     tues,thurs,sat  . 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  Surgery was done at [facility].     . DKA (diabetic ketoacidoses) 12/01/2014    Assessment: 44 YOF here with SOB that started today. Of note, she is an HD patient- normally TTS, but per ED notes, her schedule was changed last week for her daughter's birthday party on Sat. Last HD was Friday. WBC 6.8, afebrile.  Aztreonam 2g IV x1 ordered by EDP.  Goal of Therapy:  pre-HD level 15-87mcg/mL  Plan:  -vancomycin load with 1500mg  IV x1 -start vanc 750mg  IV qHD- orders not entered yet as unsure when patient will go to HD. We will follow for timing -follow for continuation of aztreonam- doing for HD patients is 1g IV q24h -follow HD schedule, clinical progression, vanc trough at  SS  Duffy Dantonio D. Ruven Corradi, PharmD, BCPS Clinical Pharmacist Pager: [phone] 01/02/2015 6:11 PM  ADDENDUM Noted patient is going for emergent HD tonight. Vancomycin has not yet been given as patient did not have a good line. Aztreonam still not ordered to continue on admission.  Plan: -retimed vancomycin 1500mg  IV to be given AFTER HD- relayed information to ED -rest of plan as above  Tristina Sahagian D. Marshayla Mitschke, PharmD, BCPS Clinical Pharmacist Pager: [phone] 01/02/2015 8:56 PM  ADDENDUM To continue aztreonam  Plan: -aztreonam 1g IV q24h starting 8/8. Dose already ordered for today. Can be given before or after HD  Javonn Gauger D. Santiago Stenzel, PharmD, BCPS Clinical Pharmacist Pager:  [phone] 01/02/2015 9:11 PM

## 2015-01-02 NOTE — ED Notes (Signed)
Second attempt unsuccessful. Pt with hx of complicated vascular issues.

## 2015-01-02 NOTE — ED Notes (Signed)
Femoral line placement successful.

## 2015-01-02 NOTE — ED Notes (Signed)
Pt reports to the ED for eval of SOB that began today. She is a dialysis patient and had her dialysis schedule changed last week. She reports that she had her daughter's bday this Saturday so they switched her to Friday this week. 12 lead en route showed peaked T waves. CBG en route read too high to read. Pt cannot speak in full sentences without pausing for breaths she is using accessory muscles to breath. Lung sounds very diminished. Pt 82% on 2 Lof O2. She does not wear O2 at home. Pt A&Ox4. Skin warm and dry.

## 2015-01-02 NOTE — ED Notes (Signed)
Second central line placement attempt.

## 2015-01-02 NOTE — Consult Note (Signed)
Reason for Consult: Respiratory distress secondary to volume overload in patient with ESRD Referring Physician: Virgel Manifold M.D. (emergency room physician)   HPI:  44 year old African-American woman with past medical history significant for end-stage renal disease status post failed kidney/pancreas transplant. Usually on a Tuesday/Thursday and Saturday dialysis schedule who underwent temporary change of schedule and had dialysis on Friday-unfortunately only ran 2 hours and 45 minutes (out of 3 hours and 45 minutes that are prescribed) and left about 0.9 kg over her dry weight. He presented earlier today to the emergency room with progressively worsening shortness of breath and documented hypoxia. Also with evidence of diabetic ketoacidosis-unfortunately has questionable compliance with insulin/monitoring. I was unable to obtain any further history due to her current medical situation.  Of note, she was seen by Dr. Scot Dock of vascular surgery last week for prolonged bleeding from her right thigh AV graft with recommendations for conservative/expectant management as salvage not possible due to iliac stent. She has not had further bleeding from this graft.  Outpatient dialysis prescription: Tuesday/Thursday/Saturday at [place]. [facility] 3 hours and 45 minutes, 160 OptiFlux, blood flow rate 400/dialysate auto flow 1.5, EDW 68 kg, 2K/2.5 calcium. No UF profiling, linear sodium modeling. Hectorol 3 g IV TTS, Venofer 50 mg weekly, Mircera 100 g IV every 2 weeks  Past Medical History  Diagnosis Date  . Hyperlipidemia   . Alopecia   . Obesity   . Iron deficiency   . ESRD (end stage renal disease)     S/p pancreatic and kidney transplant, but back on HD 2008, dialysis at [facility], [place], Sat  . RLS (restless legs syndrome)   . Injury of conjunctiva and corneal abrasion of right eye without foreign body   . Cellulitis   . Anemia   . Blood transfusion 2004    "when I had my  transplant"  . Migraine   . Hyperparathyroidism, secondary   . Diabetic retinopathy   . Hypertension     takes Metoprolol and Exforge daily  . Depression     takes Klonopin nightly  . Diabetes mellitus type 1     Pt states diagnosed at age 19 with prior episodes of DKA. S/p pancreatic transplant   . GERD (gastroesophageal reflux disease)     takes Protonix daily  . Bronchitis   . Migraine   . Asthma   . Emphysema of lung   . Angina     none in past 2 years.  . Stomach pain     Hx: of chronic  . Pneumonia 2012    "double"  . Dialysis patient     tues,thurs,sat  . 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  Surgery was done at [facility].     . DKA (diabetic ketoacidoses) 12/01/2014    Past Surgical History  Procedure Laterality Date  . 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 Thyroglobulin      x 7  . Av fistula placement      right upper arm  . Cholecystectomy  1995  .  hd graft placement/removal      "had 2 in my left upper arm"  . Eye surgery    . Retinopathy surgery    . Tooth extraction  10/10/11  X's two  . Insertion of dialysis catheter  12/08/2011    Procedure: INSERTION OF DIALYSIS CATHETER;  Surgeon: Angelia Mould, MD;  Location: [place];  Service: Vascular;  Laterality: Left;  . Av fistula placement  01/22/2012    Procedure: INSERTION OF ARTERIOVENOUS (AV) GORE-TEX GRAFT ARM;  Surgeon: Angelia Mould, MD;  Location: [place];  Service: Vascular;  Laterality: Left;  Ultrasound guided.  . Av fistula placement  03/14/2012    Procedure: INSERTION OF ARTERIOVENOUS (AV) GORE-TEX GRAFT THIGH;  Surgeon: Angelia Mould, MD;  Location: [place];  Service: Vascular;  Laterality: Right;  . False aneurysm repair Right  01/02/2013    Procedure: Excision of lymphocele in right thigh.;  Surgeon: Angelia Mould, MD;  Location: [place];  Service: Vascular;  Laterality: Right;  . Carpal tunnel release Right 03/02/2013    Procedure: CARPAL TUNNEL RELEASE;  Surgeon: Tennis Must, MD;  Location: [place];  Service: Orthopedics;  Laterality: Right;  . Flexible sigmoidoscopy N/A 03/24/2013    Procedure: FLEXIBLE SIGMOIDOSCOPY;  Surgeon: Cleotis Nipper, MD;  Location: [facility] ENDOSCOPY;  Service: Endoscopy;  Laterality: N/A;  . Revision of arteriovenous goretex graft Right 04/02/2013    Procedure: REPAIR OF PSEUDOANEURYSM OF ARTERIOVENOUS GORETEX GRAFT;  Surgeon: Angelia Mould, MD;  Location: [place];  Service: Vascular;  Laterality: Right;  . Carpal tunnel release Left 10/16/2013    Procedure: LEFT CARPAL TUNNEL RELEASE;  Surgeon: Tennis Must, MD;  Location: [place];  Service: Orthopedics;  Laterality: Left;  . Unilateral upper extremeity angiogram N/A 11/12/2011    Procedure: UNILATERAL UPPER Anselmo Rod;  Surgeon: Angelia Mould, MD;  Location: [facility] CATH LAB;  Service: Cardiovascular;  Laterality: N/A;  . Fistulogram Left 03/03/2012    Procedure: FISTULOGRAM;  Surgeon: Angelia Mould, MD;  Location: [facility] CATH LAB;  Service: Cardiovascular;  Laterality: Left;    Family History  Problem Relation Age of Onset  . Hypertension Mother   . Kidney disease Mother   . Diabetes Mother   . Hypertension Father   . Kidney disease Father   . Diabetes Father     Social History:  reports that she has never smoked. She has never used smokeless tobacco. She reports that she does not drink alcohol or use illicit drugs.  Allergies:  Allergies  Allergen Reactions  . Depakote [Divalproex Sodium] Other (See Comments)    Pt gets "delirious"  . Morphine And Related Nausea And Vomiting and Other (See Comments)    "makes me delirious"  . Penicillins Anaphylaxis    Tolerated  Zosyn Dec 2014  . Tramadol Nausea And Vomiting  . Imitrex [Sumatriptan] Other (See Comments)    seizures  . Vicodin [Hydrocodone-Acetaminophen] Itching and Rash    Medications: Prior to Admission:  (Not in a hospital admission)  Results for orders placed or performed during the hospital encounter of 01/02/15 (from the past 48 hour(s))  CBG monitoring, ED     Status: Abnormal   Collection Time: 01/02/15  4:52 PM  Result Value Ref Range   Glucose-Capillary >600 (HH) 65 - 99 mg/dL  CBC     Status: Abnormal   Collection Time: 01/02/15  5:27 PM  Result Value Ref Range   WBC 6.8 4.0 - 10.5 K/uL   RBC 3.77 (L) 3.87 - 5.11 MIL/uL   Hemoglobin 11.9 (L) 12.0 - 15.0 g/dL   HCT 39.9 36.0 - 46.0 %   MCV 105.8 (H) 78.0 -  100.0 fL   MCH 31.6 26.0 - 34.0 pg   MCHC 29.8 (L) 30.0 - 36.0 g/dL   RDW 15.4 11.5 - 15.5 %   Platelets 127 (L) 150 - 400 K/uL  Comprehensive metabolic panel     Status: Abnormal   Collection Time: 01/02/15  5:27 PM  Result Value Ref Range   Sodium 120 (L) 135 - 145 mmol/L   Potassium 6.5 (HH) 3.5 - 5.1 mmol/L    Comment: NO VISIBLE HEMOLYSIS REPEATED TO VERIFY CRITICAL RESULT CALLED TO, READ BACK BY AND VERIFIED WITH: MELISSA SCRUGGS,RN AT 1831 01/02/15 BY ZBEECH.    Chloride 83 (L) 101 - 111 mmol/L   CO2 19 (L) 22 - 32 mmol/L   Glucose, Bld 1225 (HH) 65 - 99 mg/dL    Comment: RESULTS CONFIRMED BY MANUAL DILUTION REPEATED TO VERIFY CRITICAL RESULT CALLED TO, READ BACK BY AND VERIFIED WITH: BRITTANY CHANDLER,RN AT 1846 01/02/15 BY ZBEECH.    BUN 50 (H) 6 - 20 mg/dL   Creatinine, Ser 9.38 (H) 0.44 - 1.00 mg/dL   Calcium 9.0 8.9 - 10.3 mg/dL   Total Protein 7.2 6.5 - 8.1 g/dL   Albumin 3.7 3.5 - 5.0 g/dL   AST 93 (H) 15 - 41 U/L   ALT 35 14 - 54 U/L   Alkaline Phosphatase 160 (H) 38 - 126 U/L   Total Bilirubin 1.7 (H) 0.3 - 1.2 mg/dL   GFR calc non Af Amer 5 (L) >60 mL/min   GFR calc Af Amer 5 (L) >60 mL/min    Comment: (NOTE) The eGFR has been calculated using  the CKD EPI equation. This calculation has not been validated in all clinical situations. eGFR's persistently <60 mL/min signify possible Chronic Kidney Disease.    Anion gap 18 (H) 5 - 15  Brain natriuretic peptide (order if patient c/o SOB ONLY)     Status: Abnormal   Collection Time: 01/02/15  5:27 PM  Result Value Ref Range   B Natriuretic Peptide 2030.1 (H) 0.0 - 100.0 pg/mL  Troponin I     Status: None   Collection Time: 01/02/15  5:27 PM  Result Value Ref Range   Troponin I <0.03 <0.031 ng/mL    Comment:        NO INDICATION OF MYOCARDIAL INJURY.   I-Stat Arterial Blood Gas, ED - (order at [facility] and [facility] only)     Status: Abnormal   Collection Time: 01/02/15  5:48 PM  Result Value Ref Range   pH, Arterial 7.245 (L) 7.350 - 7.450   pCO2 arterial 41.0 35.0 - 45.0 mmHg   pO2, Arterial 66.0 (L) 80.0 - 100.0 mmHg   Bicarbonate 17.8 (L) 20.0 - 24.0 mEq/L   TCO2 19 0 - 100 mmol/L   O2 Saturation 89.0 %   Acid-base deficit 9.0 (H) 0.0 - 2.0 mmol/L   Patient temperature 98.6 F    Collection site RADIAL, ALLEN'S TEST ACCEPTABLE    Drawn by Operator    Sample type ARTERIAL   Magnesium     Status: None   Collection Time: 01/02/15  5:50 PM  Result Value Ref Range   Magnesium 2.2 1.7 - 2.4 mg/dL    Dg Chest Portable 1 View  01/02/2015   CLINICAL DATA:  Short of breath. History of hypertension, diabetes, asthma and emphysema.  EXAM: PORTABLE CHEST - 1 VIEW  COMPARISON:  12/02/2014  FINDINGS: Since prior study, hazy opacities developed in the lower lungs. This obscures hemidiaphragms.  Cardiac silhouette  is mildly enlarged. No mediastinal or masses or adenopathy are noted.  No pneumothorax.  Skeletal structures are unremarkable.  IMPRESSION: 1. There is lower lung zone airspace opacity, new since the prior chest radiograph. This may reflect bilateral pneumonia or pulmonary edema. There are likely associated pleural effusions.   Electronically Signed   By: Lajean Manes M.D.   On: 01/02/2015  17:39    Review of Systems  Unable to perform ROS: severe respiratory distress   Blood pressure 194/84, pulse 88, temperature 98.5 F (36.9 C), temperature source Oral, resp. rate 27, SpO2 97 %. Physical Exam  Nursing note and vitals reviewed. Constitutional: She appears well-developed and well-nourished. She appears distressed.  Appears to be in discomfort/distress unable to speak full sentences and fatiguing after each word-words appear incoherent  HENT:  Head: Normocephalic and atraumatic.  Nose: Nose normal.  Mouth/Throat: No oropharyngeal exudate.  Eyes: EOM are normal. Pupils are equal, round, and reactive to light. No scleral icterus.  Neck: Normal range of motion. Neck supple. JVD present. No tracheal deviation present. No thyromegaly present.  Cardiovascular: Normal rate, regular rhythm and normal heart sounds.  Exam reveals no friction rub.   No murmur heard. Respiratory: Effort normal. She has rales.  GI: Soft. Bowel sounds are normal. She exhibits no distension. There is no tenderness. There is no rebound.  Musculoskeletal: Normal range of motion. She exhibits no edema.  Right thigh loop graft with positive thrill/bruit  Neurological: She is alert.  Clearly not at baseline mental status  Skin: Skin is warm and dry. No rash noted. No erythema.    Assessment/Plan: 1. Respiratory distress secondary to volume overload: With clinical evidence of volume overload as well as radiological evidence-plan for emergent hemodialysis tonight in order to alleviate her respiratory distress. 2. Hyperkalemia: Appears to be likely secondary to concomitant hyperglycemia/diabetic ketoacidosis and anticipated to improve with insulin drip however, will also be corrected with dialysis. 3. Hypertension: Questionable compliance with anti-hypertensive therapy in the past and suspected to be in part from her current respiratory distress/volume overload, monitor with hemodialysis 4. Anemia: Hemoglobin  currently at acceptable limits, monitor for re-dosing ESA 5. Diabetic ketoacidosis: To be controlled exclusively by insulin drip and in part by hemodialysis for metabolic management-of course, avoid intravenous fluids for obvious reasons 6. Metabolic bone disease: Resume binders when respiratory distress alleviated and able to take orally, resume VDRA with scheduled hemodialysis  Srija Southard K. 01/02/2015, 7:05 PM

## 2015-01-02 NOTE — Procedures (Signed)
Intubation Procedure Note [name] [id number] 1971-04-09  Procedure: Intubation Indications: Respiratory insufficiency  Procedure Details Consent: Unable to obtain consent because of altered level of consciousness. Time Out: Verified patient identification, verified procedure, site/side was marked, verified correct patient position, special equipment/implants available, medications/allergies/relevent history reviewed, required imaging and test results available.  Performed  Maximum sterile technique was used including gloves, hand hygiene and mask.  MAC and 3  Used glide scope.  Inserted 7.5 ETT.  Given 5 mg versed, 100 mcg fentanyl, 20 mg etomidate.  Evaluation Hemodynamic Status: BP stable throughout; O2 sats: stable throughout Patient's Current Condition: stable Complications: No apparent complications Patient did tolerate procedure well. Chest X-ray ordered to verify placement.  CXR: pending.  [name] Mires, MD [facility] Pulmonary/Critical Care 01/02/2015, 9:13 PM Pager:  [phone] After 3pm call: [phone]

## 2015-01-03 ENCOUNTER — Inpatient Hospital Stay (HOSPITAL_COMMUNITY): Payer: Medicare Other

## 2015-01-03 LAB — TRIGLYCERIDES: Triglycerides: 87 mg/dL (ref <150)

## 2015-01-03 LAB — GLUCOSE, CAPILLARY
GLUCOSE-CAPILLARY: 100 mg/dL — AB (ref 65–99)
GLUCOSE-CAPILLARY: 97 mg/dL (ref 65–99)
GLUCOSE-CAPILLARY: 93 mg/dL (ref 65–99)
GLUCOSE-CAPILLARY: 287 mg/dL — AB (ref 65–99)
GLUCOSE-CAPILLARY: 49 mg/dL — AB (ref 65–99)
Glucose-Capillary: 526 mg/dL — ABNORMAL HIGH (ref 65–99)
Glucose-Capillary: 108 mg/dL — ABNORMAL HIGH (ref 65–99)
Glucose-Capillary: 223 mg/dL — ABNORMAL HIGH (ref 65–99)
Glucose-Capillary: 324 mg/dL — ABNORMAL HIGH (ref 65–99)
Glucose-Capillary: 98 mg/dL (ref 65–99)
Glucose-Capillary: 133 mg/dL — ABNORMAL HIGH (ref 65–99)
Glucose-Capillary: 128 mg/dL — ABNORMAL HIGH (ref 65–99)
Glucose-Capillary: 130 mg/dL — ABNORMAL HIGH (ref 65–99)
Glucose-Capillary: 79 mg/dL (ref 65–99)
Glucose-Capillary: 70 mg/dL (ref 65–99)
Glucose-Capillary: 124 mg/dL — ABNORMAL HIGH (ref 65–99)

## 2015-01-03 LAB — RENAL FUNCTION PANEL
ANION GAP: 16 — AB (ref 5–15)
Albumin: 3.6 g/dL (ref 3.5–5.0)
Albumin: 3.9 g/dL (ref 3.5–5.0)
Anion gap: 11 (ref 5–15)
BUN: 54 mg/dL — ABNORMAL HIGH (ref 6–20)
BUN: 9 mg/dL (ref 6–20)
CALCIUM: 9.1 mg/dL (ref 8.9–10.3)
CHLORIDE: 99 mmol/L — AB (ref 101–111)
CO2: 28 mmol/L (ref 22–32)
CO2: 21 mmol/L — AB (ref 22–32)
CREATININE: 9.54 mg/dL — AB (ref 0.44–1.00)
Calcium: 9.2 mg/dL (ref 8.9–10.3)
Chloride: 92 mmol/L — ABNORMAL LOW (ref 101–111)
Creatinine, Ser: 2.6 mg/dL — ABNORMAL HIGH (ref 0.44–1.00)
GFR calc Af Amer: 25 mL/min — ABNORMAL LOW (ref >60)
GFR calc non Af Amer: 4 mL/min — ABNORMAL LOW (ref >60)
GFR calc non Af Amer: 22 mL/min — ABNORMAL LOW (ref >60)
GFR, EST AFRICAN AMERICAN: 5 mL/min — AB (ref >60)
GLUCOSE: 568 mg/dL — AB (ref 65–99)
Glucose, Bld: 67 mg/dL (ref 65–99)
POTASSIUM: 4 mmol/L (ref 3.5–5.1)
Phosphorus: 2.2 mg/dL — ABNORMAL LOW (ref 2.5–4.6)
Phosphorus: <1 mg/dL — CL (ref 2.5–4.6)
Potassium: 3 mmol/L — ABNORMAL LOW (ref 3.5–5.1)
SODIUM: 129 mmol/L — AB (ref 135–145)
Sodium: 138 mmol/L (ref 135–145)

## 2015-01-03 LAB — MRSA PCR SCREENING: MRSA by PCR: NEGATIVE

## 2015-01-03 LAB — MAGNESIUM: Magnesium: 2.2 mg/dL (ref 1.7–2.4)

## 2015-01-03 MED ORDER — HEPARIN SODIUM (PORCINE) 1000 UNIT/ML DIALYSIS
1000.0000 [IU] | INTRAMUSCULAR | Status: DC | PRN
  Filled 2015-01-03: qty 1

## 2015-01-03 MED ORDER — SODIUM CHLORIDE 0.9 % IV SOLN: 100.0000 mL | INTRAVENOUS | Status: DC | PRN

## 2015-01-03 MED ORDER — CHLORHEXIDINE GLUCONATE 0.12% ORAL RINSE (MEDLINE KIT)
15.0000 mL | Freq: Two times a day (BID) | OROMUCOSAL | Status: DC
  Administered 2015-01-03 – 2015-01-05 (×5): 15 mL via OROMUCOSAL

## 2015-01-03 MED ORDER — DEXTROSE 50 % IV SOLN
1.0000 | Freq: Once | INTRAVENOUS | Status: AC
  Administered 2015-01-03: 50 mL via INTRAVENOUS

## 2015-01-03 MED ORDER — LIDOCAINE-PRILOCAINE 2.5-2.5 % EX CREA: 1.0000 "application " | TOPICAL_CREAM | CUTANEOUS | Status: DC | PRN

## 2015-01-03 MED ORDER — ANTISEPTIC ORAL RINSE SOLUTION (CORINZ)
7.0000 mL | Freq: Four times a day (QID) | OROMUCOSAL | Status: DC
  Administered 2015-01-03 – 2015-01-06 (×11): 7 mL via OROMUCOSAL

## 2015-01-03 MED ORDER — INSULIN ASPART 100 UNIT/ML ~~LOC~~ SOLN
0.0000 [IU] | SUBCUTANEOUS | Status: DC
  Administered 2015-01-03: 2 [IU] via SUBCUTANEOUS
  Administered 2015-01-03: 8 [IU] via SUBCUTANEOUS
  Administered 2015-01-03: 2 [IU] via SUBCUTANEOUS
  Administered 2015-01-04: 8 [IU] via SUBCUTANEOUS
  Administered 2015-01-04: 11 [IU] via SUBCUTANEOUS

## 2015-01-03 MED ORDER — NEPRO/CARBSTEADY PO LIQD: 237.0000 mL | ORAL | Status: DC | PRN

## 2015-01-03 MED ORDER — NOREPINEPHRINE BITARTRATE 1 MG/ML IV SOLN
0.0000 ug/min | INTRAVENOUS | Status: DC
  Administered 2015-01-03: 2 ug/min via INTRAVENOUS
  Filled 2015-01-03: qty 4

## 2015-01-03 MED ORDER — ALBUMIN HUMAN 25 % IV SOLN
25.0000 g | Freq: Once | INTRAVENOUS | Status: AC
  Administered 2015-01-03: 25 g via INTRAVENOUS
  Filled 2015-01-03: qty 100

## 2015-01-03 MED ORDER — POTASSIUM CHLORIDE 10 MEQ/50ML IV SOLN
10.0000 meq | INTRAVENOUS | Status: AC
  Administered 2015-01-03 (×3): 10 meq via INTRAVENOUS
  Filled 2015-01-03: qty 50

## 2015-01-03 MED ORDER — ALTEPLASE 2 MG IJ SOLR: 2.0000 mg | Freq: Once | INTRAMUSCULAR | Status: AC | PRN

## 2015-01-03 MED ORDER — LIDOCAINE HCL (PF) 1 % IJ SOLN: 5.0000 mL | INTRAMUSCULAR | Status: DC | PRN

## 2015-01-03 MED ORDER — PENTAFLUOROPROP-TETRAFLUOROETH EX AERO: 1.0000 "application " | INHALATION_SPRAY | CUTANEOUS | Status: DC | PRN

## 2015-01-03 MED ORDER — DEXTROSE 50 % IV SOLN
INTRAVENOUS | Status: AC
  Filled 2015-01-03: qty 50

## 2015-01-03 MED ORDER — VANCOMYCIN HCL IN DEXTROSE 750-5 MG/150ML-% IV SOLN
750.0000 mg | INTRAVENOUS | Status: DC | PRN
  Filled 2015-01-03: qty 150

## 2015-01-03 MED ORDER — SODIUM PHOSPHATE 3 MMOLE/ML IV SOLN
30.0000 mmol | INTRAVENOUS | Status: AC
  Administered 2015-01-03: 30 mmol via INTRAVENOUS
  Filled 2015-01-03: qty 10

## 2015-01-03 NOTE — Progress Notes (Signed)
Initial Nutrition Assessment  DOCUMENTATION CODES:   Not applicable  INTERVENTION:    If TF started, recommend Nepro formula -- initiate at 10 ml/hr and increase by 10 ml every 4 hours to goal rate of 30 ml/hr   Prostat liquid protein 30 ml TID  Total TF regimen to provide 1596 kcals, 103 gm protein, 523 ml of free water  NUTRITION DIAGNOSIS:   Inadequate oral intake related to inability to eat as evidenced by NPO status  GOAL:   Patient will meet greater than or equal to 90% of their needs  MONITOR:   Vent status, Labs, Weight trends, I & O's  REASON FOR ASSESSMENT:   Ventilator  ASSESSMENT:   44 yo Female presented to ER with dyspnea, altered mental status, hyperglycemia, hyperkalemia, pulmonary edema, possible PNA. She has hx of ESRD on HD. Intubated in ER.  Patient is currently intubated on ventilator support -- OGT in place MV: 10.6 L/min Temp (24hrs), Avg:98.6 F (37 C), Min:98 F (36.7 C), Max:99.1 F (37.3 C)    Propofol: 10.3  ml/hr -----> 272 fat kcals   RD unable to complete Nutrition Focused Physical Exam at this time.  Diet Order:  Diet NPO time specified  Skin:  Reviewed, no issues  Last BM:  Unknown  Height:   Ht Readings from Last 1 Encounters:  01/03/15 5\' 1"  (1.549 m)    Weight:   Wt Readings from Last 1 Encounters:  01/03/15 155 lb 10.3 oz (70.6 kg)    Ideal Body Weight:  48 kg  BMI:  Body mass index is 29.42 kg/(m^2).  Estimated Nutritional Needs:   Kcal:  1588  Protein:  105-115 gm  Fluid:  per MD  EDUCATION NEEDS:   No education needs identified at this time  Arthur Holms, RD, LDN Pager #: [phone] After-Hours Pager #: [phone]

## 2015-01-03 NOTE — Progress Notes (Signed)
Pt's CBG @ 1356 37.  Rechecked on opposite hand with a result of 49.  Hypoglycemia protocol placed.  1 amp of D50 given @ 1403.  CBG rechecked @ 1421 with a result of 130.  Will continue to monitor pt.

## 2015-01-03 NOTE — Progress Notes (Deleted)
  [facility] Progress Note   Subjective: 1.7 L off last night, CXR improved edema this am  Filed Vitals:   01/03/15 0715 01/03/15 0755 01/03/15 0800 01/03/15 0809  BP: 102/48 126/63 134/60   Pulse: 77 84 88   Temp:    98.9 F (37.2 C)  TempSrc:    Axillary  Resp: 24 31 24    Height:      Weight:      SpO2: 100% 100% 100%    Exam: On vent, sedated  no jvd Chest clear RRR no RG ABd soft ntnd no ascites L thigh central line R thigh AVG patent Neuro moving all ext  MWF NW 3h 45 min 68kg  2/2.25 bath  R thigh AVG  Heparin ?  Hect 3 / Venofer 50 / Mircera 100 q 2wks      Assessment: 1. DKA improved 2. Resp failure/ pulm edema - improving 3. ESRD on HD 4. Hyperkalemia resolved 5. Hypophosphatemia 6. Anemia hold esa, Hb up  Plan - HD again today, UF to dry wt, IV Na phos    Kelly Splinter MD  pager [phone]    cell [phone]  01/03/2015, 9:05 AM     Recent Labs Lab 01/02/15 1727 01/03/15 0010 01/03/15 0607  NA 120* 129* 138  K 6.5* 4.0 3.0*  CL 83* 92* 99*  CO2 19* 21* 28  GLUCOSE 1225* 568* 67  BUN 50* 54* 9  CREATININE 9.38* 9.54* 2.60*  CALCIUM 9.0 9.1 9.2  PHOS  --  2.2* <1.0*    Recent Labs Lab 01/02/15 1727 01/03/15 0010 01/03/15 0607  AST 93*  --   --   ALT 35  --   --   ALKPHOS 160*  --   --   BILITOT 1.7*  --   --   PROT 7.2  --   --   ALBUMIN 3.7 3.6 3.9    Recent Labs Lab 01/02/15 1727  WBC 6.8  HGB 11.9*  HCT 39.9  MCV 105.8*  PLT 127*   . amLODipine  10 mg Per Tube Daily  . antiseptic oral rinse  7 mL Mouth Rinse QID  . aspirin  325 mg Per Tube Daily  . aztreonam  1 g Intravenous Q24H  . aztreonam  2 g Intravenous Once  . calcium gluconate 1 GM IV  1 g Intravenous Once  . chlorhexidine gluconate  15 mL Mouth Rinse BID  . heparin subcutaneous  5,000 Units Subcutaneous 3 times per day  . metoprolol tartrate  50 mg Per Tube BID  . ondansetron (ZOFRAN) IV  4 mg Intravenous Once  . pantoprazole sodium  40 mg  Per Tube Q24H  . simvastatin  20 mg Per Tube QHS  . sodium bicarbonate  25 mEq Intravenous Once   . sodium chloride Stopped (01/03/15 0212)  . dextrose 5 % and 0.45% NaCl Stopped (01/03/15 0530)  . fentaNYL infusion INTRAVENOUS 150 mcg/hr (01/03/15 0700)  . insulin (NOVOLIN-R) infusion 16.2 Units/hr (01/02/15 2219)  . insulin (NOVOLIN-R) infusion Stopped (01/03/15 0600)  . propofol (DIPRIVAN) infusion 45 mcg/kg/min (01/03/15 0700)   albuterol, fentaNYL, hydrALAZINE, ondansetron (ZOFRAN) IV

## 2015-01-03 NOTE — Progress Notes (Signed)
eLink Physician-Brief Progress Note Patient Name: BOBBYE PETTI DOB: 1971/02/14 MRN: [id number]   Date of Service  01/03/2015  HPI/Events of Note  Bilateral wrist restraints requested by bedside nurse.  eICU Interventions  Will order bilateral wrist restraints.     Intervention Category Minor Interventions: Routine modifications to care plan (e.g. PRN medications for pain, fever)  Berdene Askari Eugene 01/03/2015, 12:23 AM

## 2015-01-03 NOTE — Progress Notes (Signed)
ANTIBIOTIC CONSULT NOTE - Follow-Up  Pharmacy Consult for vancomycin, aztreonam Indication: pneumonia  Allergies  Allergen Reactions  . Depakote [Divalproex Sodium] Other (See Comments)    Pt gets "delirious"  . Morphine And Related Nausea And Vomiting and Other (See Comments)    "makes me delirious"  . Penicillins Anaphylaxis    Tolerated Zosyn Dec 2014  . Tramadol Nausea And Vomiting  . Imitrex [Sumatriptan] Other (See Comments)    seizures  . Vicodin [Hydrocodone-Acetaminophen] Itching and Rash    Patient Measurements: Height: 5\' 1"  (154.9 cm) Weight: 155 lb 10.3 oz (70.6 kg) IBW/kg (Calculated) : 47.8   Vital Signs: Temp: 98.9 F (37.2 C) (08/08 0809) Temp Source: Axillary (08/08 0809) BP: 122/61 mmHg (08/08 0900) Pulse Rate: 89 (08/08 0900) Intake/Output from previous day: 08/07 0701 - 08/08 0700 In: 1131 [I.V.:501; NG/GT:30; IV Piggyback:600] Out: 1762  Intake/Output from this shift: Total I/O In: 57.8 [I.V.:27.8; NG/GT:30] Out: -   Labs:  Recent Labs  01/02/15 1727 01/03/15 0010 01/03/15 0607  WBC 6.8  --   --   HGB 11.9*  --   --   PLT 127*  --   --   CREATININE 9.38* 9.54* 2.60*   Estimated Creatinine Clearance: 25.3 mL/min (by C-G formula based on Cr of 2.6). No results for input(s): VANCOTROUGH, VANCOPEAK, VANCORANDOM, GENTTROUGH, GENTPEAK, GENTRANDOM, TOBRATROUGH, TOBRAPEAK, TOBRARND, AMIKACINPEAK, AMIKACINTROU, AMIKACIN in the last 72 hours.   Microbiology: Recent Results (from the past 720 hour(s))  MRSA PCR Screening     Status: None   Collection Time: 01/03/15 12:14 AM  Result Value Ref Range Status   MRSA by PCR NEGATIVE NEGATIVE Final    Comment:        The GeneXpert MRSA Assay (FDA approved for NASAL specimens only), is one component of a comprehensive MRSA colonization surveillance program. It is not intended to diagnose MRSA infection nor to guide or monitor treatment for MRSA infections.     Medical History: Past Medical  History  Diagnosis Date  . Hyperlipidemia   . Alopecia   . Obesity   . Iron deficiency   . ESRD (end stage renal disease)     S/p pancreatic and kidney transplant, but back on HD 2008, dialysis at [facility], [place], Sat  . RLS (restless legs syndrome)   . Injury of conjunctiva and corneal abrasion of right eye without foreign body   . Cellulitis   . Anemia   . Blood transfusion 2004    "when I had my transplant"  . Migraine   . Hyperparathyroidism, secondary   . Diabetic retinopathy   . Hypertension     takes Metoprolol and Exforge daily  . Depression     takes Klonopin nightly  . Diabetes mellitus type 1     Pt states diagnosed at age 7 with prior episodes of DKA. S/p pancreatic transplant   . GERD (gastroesophageal reflux disease)     takes Protonix daily  . Bronchitis   . Migraine   . Asthma   . Emphysema of lung   . Angina     none in past 2 years.  . Stomach pain     Hx: of chronic  . Pneumonia 2012    "double"  . Dialysis patient     tues,thurs,sat  . 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  Surgery was done at [facility].     . DKA (diabetic ketoacidoses) 12/01/2014  . Diastolic CHF, acute on chronic 01/02/2015    Assessment: 44 YOF presenting with AMS, Hyperglycemia, Hyperkalemia, Possible PNA  PMH: ESRD on HD, HLD, DM, CHF  ID: [id number] for possible PNA WBC 6.8, afebrile.  Vanc 8/7>>  8/7 Vanc 1500 mg x 1 Aztreonam 8/7>>  MRSA PCR Neg Blood 8/8 x 2  Renal: going for emergent HD 8/7 PM, K 3.0, SCr 2.60 HD 8/8 (early AM): BFR 400 ml/min x 4 hrs HD 8/8   Goal of Therapy:  pre-HD level 15-84mcg/mL  Plan:  -vanc 750mg  IV after HD today - will hold off scheduling with off schedule HD -aztreonam 1g IV q24h -follow HD schedule, clinical  progression, vanc trough at [facility], PharmD, BCPS Clinical Pharmacist Pager [phone] 01/03/2015 9:54 AM

## 2015-01-03 NOTE — Progress Notes (Signed)
PULMONARY / CRITICAL CARE MEDICINE   Name: [name] MRN: [id number] DOB: [birth year]    ADMISSION DATE:  01/02/2015  REFERRING MD :  ER  CHIEF COMPLAINT:  Alerted mental status  INITIAL PRESENTATION:  44 yo female presented to ER with dyspnea, altered mental status, hyperglycemia, hyperkalemia, pulmonary edema, possible PNA.  She has hx of ESRD on HD.  Intubated in ER.  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  She presented to the ER with confusion, dyspnea.  She was found to have hyperkalemia, hyperglycemia, pulmonary edema, possible pneumonia.  She was difficulty IV access with multiple attempts by ER staff for CVL placement.  She has her AV graft in her Rt groin.  PCCM asked to assess for respiratory failure.  She required intubation in ER.   SUBJECTIVE: sedated on propofol/fent gtt Was very  agitated  VITAL SIGNS: Temp:  [98.4 F (36.9 C)-99.1 F (37.3 C)] 98.9 F (37.2 C) (08/08 0809) Pulse Rate:  [77-99] 89 (08/08 0900) Resp:  [3-35] 24 (08/08 0900) BP: (67-208)/(37-99) 122/61 mmHg (08/08 0900) SpO2:  [89 %-100 %] 99 % (08/08 0900) FiO2 (%):  [50 %-100 %] 50 % (08/08 0900) Weight:  [70 kg (154 lb 5.2 oz)-71.6 kg (157 lb 13.6 oz)] 70.6 kg (155 lb 10.3 oz) (08/08 0530) HEMODYNAMICS:   VENTILATOR SETTINGS: Vent Mode:  [-] PRVC FiO2 (%):  [50 %-100 %] 50 % Set Rate:  [24 bmp] 24 bmp Vt Set:  [440 mL] 440 mL PEEP:  [5 cmH20] 5 cmH20 Plateau Pressure:  [22 [id number] cmH20] 22 cmH20 INTAKE / OUTPUT:  Intake/Output Summary (Last 24 hours) at 01/03/15 6962 Last data filed at 01/03/15  0900  Gross per 24 hour  Intake 1188.75 ml  Output   1762 ml  Net -573.25 ml    PHYSICAL EXAMINATION: General: chr  ill appearing Neuro:  Somnolent, int agitation, randomly moves extremities HEENT:  Dry mucosa Cardiovascular:  Regular, tachycardic Lungs:  B/l crackles Abdomen:  Soft, non tender, decreased bowel sounds Musculoskeletal:  Decreased muscle bulk, no edema, AV graft Rt groin Skin:  No rashes  LABS:  CBC  Recent Labs Lab 01/02/15 1727  WBC 6.8  HGB 11.9*  HCT 39.9  PLT 127*   Coag's No results for input(s): APTT, INR in the last 168 hours.   BMET  Recent Labs Lab 01/02/15 1727 01/03/15 0010 01/03/15 0607  NA 120* 129* 138  K 6.5* 4.0 3.0*  CL 83* 92* 99*  CO2 19* 21* 28  BUN 50* 54* 9  CREATININE 9.38* 9.54* 2.60*  GLUCOSE 1225* 568* 67   Electrolytes  Recent Labs Lab 01/02/15 1727 01/02/15 1750 01/03/15 0005 01/03/15 0010 01/03/15 0607  CALCIUM 9.0  --   --  9.1 9.2  MG  --  2.2 2.2  --   --   PHOS  --   --   --  2.2* <1.0*   Sepsis Markers No results for input(s): LATICACIDVEN, PROCALCITON, O2SATVEN in the last 168 hours.   ABG  Recent Labs Lab 01/02/15 1748 01/02/15 2238  PHART 7.245* 7.286*  PCO2ART 41.0 41.5  PO2ART 66.0* 183.0*   Liver Enzymes  Recent Labs Lab 01/02/15 1727 01/03/15 0010 01/03/15 0607  AST 93*  --   --   ALT 35  --   --   ALKPHOS 160*  --   --   BILITOT 1.7*  --   --   ALBUMIN 3.7 3.6 3.9   Cardiac Enzymes  Recent Labs Lab 01/02/15 1727  TROPONINI <0.03   Glucose  Recent Labs Lab 01/03/15 0314 01/03/15 0317 01/03/15 0420 01/03/15 0510 01/03/15 0616 01/03/15 0729  GLUCAP 98 100* 108* 97 93 133*    Imaging Dg Chest Port 1 View  01/03/2015   CLINICAL DATA:  Follow-up respiratory failure.  EXAM: PORTABLE CHEST - 1 VIEW  COMPARISON:  01/02/2015.  FINDINGS: Stable support apparatus, with ET tube 4 cm above carina. Cardiomegaly. BILATERAL effusions with mild to moderate vascular  congestion, slightly improved.  IMPRESSION: Improving pulmonary edema. Cardiomegaly persists. Stable support tubes and apparatus. No pneumothorax.   Electronically Signed   By: Staci Righter M.D.   On: 01/03/2015 07:28   Dg Chest Portable 1 View  01/02/2015   CLINICAL DATA:  Evaluate endotracheal tube placement.  EXAM: PORTABLE CHEST - 1 VIEW  COMPARISON:  Earlier same date.  FINDINGS: Monitoring leads overlie the patient. ET tube terminates within the mid trachea. Enteric tube courses inferior to the diaphragm. Stable enlarged cardiac mediastinal contours. Diffuse bilateral airspace opacities. Small layering bilateral pleural effusions. No pneumothorax.  IMPRESSION: ET tube terminates in the mid trachea. Enteric tube courses inferior to the diaphragm.  Cardiomegaly. Diffuse bilateral airspace opacities favored to represent edema. Pneumonia could have a similar appearance.   Electronically Signed   By: Lovey Newcomer M.D.   On: 01/02/2015 22:22   Dg Chest Portable 1 View  01/02/2015   CLINICAL DATA:  Short of breath. History of hypertension, diabetes, asthma and emphysema.  EXAM: PORTABLE CHEST - 1 VIEW  COMPARISON:  12/02/2014  FINDINGS: Since prior study, hazy opacities developed in the lower lungs. This obscures hemidiaphragms.  Cardiac silhouette is mildly enlarged. No mediastinal or masses or adenopathy are noted.  No pneumothorax.  Skeletal structures are unremarkable.  IMPRESSION: 1. There is lower lung zone airspace opacity, new since the prior chest radiograph. This may reflect bilateral pneumonia or pulmonary edema. There are likely associated pleural effusions.   Electronically Signed   By: Lajean Manes M.D.   On: 01/02/2015 17:39     ASSESSMENT / PLAN:  PULMONARY ETT 8/07 >>  A: Acute respiratory failure in setting of DKA, pulmonary edema, and possible PNA. Hx of asthma. P:   Full vent support -SBTs after rpt HD with goal extubation Prn BDs Hold outpt singulair  CARDIOVASCULAR  A:   Hx of HTN, HLD, chronic diastolic heart failure. Difficult IV access. P:  Continue amlodipine, metoprolol, ASA, simvastatin Prn hydralazine for SBP > 180  RENAL A:   ESRD on HD T/Th/Sat, with hx of failed renal/pancreatic transplant. Hyperkalemia. Hyponatremia. Metabolic acidosis 2nd to DKA. P:   HD per renal -rpt session planned today   GASTROINTESTINAL A:   Nutrition. Hx of GERD, gastroparesis. Nausea. P:   Tube feeds if remains intubated x 24h  Protonix for SUP Prn zofran Might need to resume outpt reglan when she starts tube feeds  HEMATOLOGIC A:   Anemia of chronic disease. P:  SQ heparin for DVT prophylaxis  INFECTIOUS A:   Possible pneumonia. 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 P:   Insulin gtt -change to SSI with sq lantus Dc dextrose  NEUROLOGIC A:   Acute metabolic encephalopathy. Hx of RLS, migraine HA, depression P:   RASS goal: -1 Hold outpt klonopin, neurontin, oxycodone, ambien  Summary - Fluid overload , emergent HD, then hope to extubate DKA -resolved, AG closed  The patient is critically ill with multiple organ systems failure and requires high complexity decision making for assessment and support, frequent evaluation and titration of therapies, application of advanced monitoring technologies and extensive interpretation of multiple databases. Critical Care Time devoted to patient care services described in this note independent of APP time is 32 minutes.   Kara Mead MD. Shade Flood. [place] [facility] Pager [phone] If no response call [phone]    01/03/2015, 9:23 AM

## 2015-01-03 NOTE — Progress Notes (Signed)
eLink Physician-Brief Progress Note Patient Name: [name] DOB: 1970-07-13 MRN: [id number]   Date of Service  01/03/2015  HPI/Events of Note  Difficult to keep pt sedated and comfortable due to hypotension on sedating meds. There are plans for HD this pm. Dr Jonnie Finner OK with temporary pressors to facilitate intermittent HD while on sedation.    eICU Interventions  Will try to trade fentanyl for propofol (increase the latter) to see if she tolerates better. If not then will start low dose norepi to allow both sedation and HD this pm.      Intervention Category Intermediate Interventions: Hypotension - evaluation and management  Aunna Snooks S. 01/03/2015, 3:49 PM

## 2015-01-03 NOTE — Progress Notes (Signed)
Inpatient Diabetes Program Recommendations  AACE/ADA: New Consensus Statement on Inpatient Glycemic Control (2013)  Target Ranges:  Prepandial:   less than 140 mg/dL      Peak postprandial:   less than 180 mg/dL (1-2 hours)      Critically ill patients:  140 - 180 mg/dL   Results for KEAISHA, SUBLETTE (MRN [id number]) as of 01/03/2015 08:54  Ref. Range 01/03/2015 00:14 01/03/2015 01:21 01/03/2015 02:06 01/03/2015 03:14 01/03/2015 03:17 01/03/2015 04:20 01/03/2015 05:10 01/03/2015 06:16 01/03/2015 07:29  Glucose-Capillary Latest Ref Range: 65-99 mg/dL 526 (H) 324 (H) 223 (H) 98 100 (H) 108 (H) 97 93 133 (H)    Diabetes history: DM1 Outpatient Diabetes medications: Lantus 7 units BID, Novolog 6 units BID with meals Current orders for Inpatient glycemic control: Novolin R insulin drip per DKA protocol  Inpatient Diabetes Program Recommendations Insulin - Basal: At time of transition from IV to SQ insulin, please consider ordering Lantus 5 units BID. Correction (SSI): At time of transition from IV to SQ insulin, please consider ordering Novolog 0-9 units Q4H.  Thanks, Barnie Alderman, RN, MSN, CCRN, CDE Diabetes Coordinator Inpatient Diabetes Program [phone] (Team Pager from [place] to [place]) [phone] ([facility] office) [phone] [facility] office) [phone] [facility] office)

## 2015-01-04 ENCOUNTER — Inpatient Hospital Stay (HOSPITAL_COMMUNITY): Payer: Medicare Other

## 2015-01-04 LAB — GLUCOSE, CAPILLARY
GLUCOSE-CAPILLARY: 295 mg/dL — AB (ref 65–99)
GLUCOSE-CAPILLARY: 306 mg/dL — AB (ref 65–99)
GLUCOSE-CAPILLARY: 41 mg/dL — AB (ref 65–99)
GLUCOSE-CAPILLARY: 64 mg/dL — AB (ref 65–99)
Glucose-Capillary: 37 mg/dL — CL (ref 65–99)
Glucose-Capillary: 164 mg/dL — ABNORMAL HIGH (ref 65–99)
Glucose-Capillary: 42 mg/dL — CL (ref 65–99)
Glucose-Capillary: 107 mg/dL — ABNORMAL HIGH (ref 65–99)
Glucose-Capillary: 174 mg/dL — ABNORMAL HIGH (ref 65–99)
Glucose-Capillary: 309 mg/dL — ABNORMAL HIGH (ref 65–99)

## 2015-01-04 LAB — CBC
HCT: 32.1 % — ABNORMAL LOW (ref 36.0–46.0)
Hemoglobin: 10.5 g/dL — ABNORMAL LOW (ref 12.0–15.0)
MCH: 30.7 pg (ref 26.0–34.0)
MCHC: 32.7 g/dL (ref 30.0–36.0)
MCV: 93.9 fL (ref 78.0–100.0)
Platelets: 103 10*3/uL — ABNORMAL LOW (ref 150–400)
RBC: 3.42 MIL/uL — AB (ref 3.87–5.11)
RDW: 15.2 % (ref 11.5–15.5)
WBC: 6.8 10*3/uL (ref 4.0–10.5)

## 2015-01-04 LAB — BASIC METABOLIC PANEL
ANION GAP: 11 (ref 5–15)
BUN: 8 mg/dL (ref 6–20)
CHLORIDE: 100 mmol/L — AB (ref 101–111)
CO2: 27 mmol/L (ref 22–32)
Calcium: 8.2 mg/dL — ABNORMAL LOW (ref 8.9–10.3)
Creatinine, Ser: 3.34 mg/dL — ABNORMAL HIGH (ref 0.44–1.00)
GFR calc Af Amer: 18 mL/min — ABNORMAL LOW (ref >60)
GFR, EST NON AFRICAN AMERICAN: 16 mL/min — AB (ref >60)
GLUCOSE: 132 mg/dL — AB (ref 65–99)
Potassium: 3.1 mmol/L — ABNORMAL LOW (ref 3.5–5.1)
SODIUM: 138 mmol/L (ref 135–145)

## 2015-01-04 LAB — POCT I-STAT 3, ART BLOOD GAS (G3+)
Acid-Base Excess: 2 mmol/L (ref 0.0–2.0)
BICARBONATE: 27.6 meq/L — AB (ref 20.0–24.0)
O2 Saturation: 98 %
PH ART: 7.371 (ref 7.350–7.450)
TCO2: 29 mmol/L (ref 0–100)
pCO2 arterial: 47.4 mmHg — ABNORMAL HIGH (ref 35.0–45.0)
pO2, Arterial: 102 mmHg — ABNORMAL HIGH (ref 80.0–100.0)

## 2015-01-04 LAB — PHOSPHORUS: PHOSPHORUS: 4.5 mg/dL (ref 2.5–4.6)

## 2015-01-04 MED ORDER — DEXTROSE 50 % IV SOLN
INTRAVENOUS | Status: AC
  Administered 2015-01-04: 50 mL
  Filled 2015-01-04: qty 50

## 2015-01-04 MED ORDER — LORAZEPAM 2 MG/ML IJ SOLN
0.5000 mg | INTRAMUSCULAR | Status: DC | PRN
  Administered 2015-01-04: 0.5 mg via INTRAVENOUS

## 2015-01-04 MED ORDER — INSULIN GLARGINE 100 UNIT/ML ~~LOC~~ SOLN
5.0000 [IU] | Freq: Every day | SUBCUTANEOUS | Status: DC
  Administered 2015-01-04 – 2015-01-06 (×3): 5 [IU] via SUBCUTANEOUS
  Filled 2015-01-04 (×4): qty 0.05

## 2015-01-04 MED ORDER — NALOXONE HCL 0.4 MG/ML IJ SOLN
0.2000 mg | INTRAMUSCULAR | Status: DC | PRN
  Administered 2015-01-04: 0.2 mg via INTRAVENOUS
  Filled 2015-01-04: qty 1

## 2015-01-04 MED ORDER — NALOXONE HCL 0.4 MG/ML IJ SOLN
INTRAMUSCULAR | Status: AC
  Administered 2015-01-04: 0.2 mg
  Filled 2015-01-04: qty 1

## 2015-01-04 MED ORDER — NALOXONE HCL 0.4 MG/ML IJ SOLN
0.2000 mg | Freq: Once | INTRAMUSCULAR | Status: AC
  Administered 2015-01-04: 0.2 mg via INTRAVENOUS

## 2015-01-04 MED ORDER — INSULIN ASPART 100 UNIT/ML ~~LOC~~ SOLN
0.0000 [IU] | SUBCUTANEOUS | Status: DC
  Administered 2015-01-04: 4 [IU] via SUBCUTANEOUS
  Administered 2015-01-05: 1 [IU] via SUBCUTANEOUS
  Administered 2015-01-05: 2 [IU] via SUBCUTANEOUS
  Administered 2015-01-05: 1 [IU] via SUBCUTANEOUS
  Administered 2015-01-06: 4 [IU] via SUBCUTANEOUS
  Administered 2015-01-06: 5 [IU] via SUBCUTANEOUS
  Administered 2015-01-06 – 2015-01-07 (×3): 2 [IU] via SUBCUTANEOUS
  Administered 2015-01-07: 3 [IU] via SUBCUTANEOUS
  Administered 2015-01-07: 2 [IU] via SUBCUTANEOUS
  Administered 2015-01-07: 3 [IU] via SUBCUTANEOUS

## 2015-01-04 MED ORDER — DEXTROSE 50 % IV SOLN
50.0000 mL | Freq: Once | INTRAVENOUS | Status: AC
  Administered 2015-01-04: 50 mL via INTRAVENOUS

## 2015-01-04 MED ORDER — LORAZEPAM 2 MG/ML IJ SOLN
INTRAMUSCULAR | Status: AC
Start: 2015-01-04 — End: 2015-01-04
  Filled 2015-01-04: qty 1

## 2015-01-04 MED ORDER — DEXTROSE 50 % IV SOLN
INTRAVENOUS | Status: AC
  Administered 2015-01-04: 50 mL via INTRAVENOUS
  Filled 2015-01-04: qty 50

## 2015-01-04 NOTE — Care Management Note (Signed)
Case Management Note  Patient Details  Name: [name] MRN: [id number] Date of Birth: 05-24-1971  Subjective/Objective:    Extubated, but reintubated again.  Back on pressors.                  Action/Plan:   Expected Discharge Date:                  Expected Discharge Plan:  Home/Self Care  In-House Referral:     Discharge planning Services     Post Acute Care Choice:    Choice offered to:     DME Arranged:    DME Agency:     HH Arranged:    HH Agency:     Status of Service:  In process, will continue to follow  Medicare Important Message Given:    Date Medicare IM Given:    Medicare IM give by:    Date Additional Medicare IM Given:    Additional Medicare Important Message give by:     If discussed at [place] of Stay Meetings, dates discussed:    Additional Comments:  Vergie Living, RN 01/04/2015, 2:03 PM

## 2015-01-04 NOTE — Progress Notes (Addendum)
eLink Physician-Brief Progress Note Patient Name: [name] DOB: 1970/12/02 MRN: [id number]   Date of Service  01/04/2015  HPI/Events of Note  Patient bottom blood glucose to 64 after receiving Novolog 11 units [place] for blood glucose = 306. Diabetes Nurse recommends Lantus + custom SSI Q 4 hours.   eICU Interventions  Will change insulin coverage to Lantus + custom SSI.     Intervention Category Intermediate Interventions: Hyperglycemia - evaluation and treatment  Netha Dafoe Cornelia Copa 01/04/2015, 7:56 PM

## 2015-01-04 NOTE — Progress Notes (Signed)
Pt continues to be apenic with spo2 dropping into low 70's upper 60's. When instructed, pt will take a breath but then goes apenic again. RT placed pt emergently on bipap while RN called Dr Elsworth Soho to update. RT continues to monitor closely

## 2015-01-04 NOTE — Progress Notes (Addendum)
Inpatient Diabetes Program Recommendations  AACE/ADA: New Consensus Statement on Inpatient Glycemic Control (2013)  Target Ranges:  Prepandial:   less than 140 mg/dL      Peak postprandial:   less than 180 mg/dL (1-2 hours)      Critically ill patients:  140 - 180 mg/dL   Review of glycemic control:  Results for [name], [name] (MRN [id number]) as of 01/04/2015 13:00  Ref. Range 01/03/2015 12:13 01/03/2015 13:56 01/03/2015 13:58 01/03/2015 14:21 01/03/2015 15:50 01/03/2015 15:58 01/03/2015 19:23 01/03/2015 23:49 01/04/2015 03:44 01/04/2015 03:55 01/04/2015 04:23 01/04/2015 07:52 01/04/2015 11:46  Glucose-Capillary Latest Ref Range: 65-99 mg/dL 128 (H) 37 (LL) 49 (L) 130 (H) 70 79 124 (H) 295 (H) 41 (LL) 42 (LL) 164 (H) 107 (H) 306 (H)   Note labile blood sugars.  Patient has history of DKA admits.  She is sensitive to insulin.  Diabetes history: Type 1 with hx. Renal/ pancreatic transplant (pancreas failed in 2014) Outpatient Diabetes medications: Lantus 7 units bid, Novolog 6 units bid Current orders for Inpatient glycemic control:  Note that patient was on insulin drip and then transitioned to SQ Novolog. Patient received 11 units of Novolog at 11:54 mg/dL today.   Due to patient's history of Type 1 diabetes, she will need basal insulin.  Consider Lantus 5 units bid. Please decrease Novolog correction to custom scale starting at 151 mg/dL.  Consider Novolog correction 151-200 mg/dL-1 unit, 201-250 mg/dL-2 units, 251-300 mg/dL-3 units, 301-350 mg/dL-4 units, 351-400 mg/dL-5 units.    Will discuss with RN.    Thanks, Adah Perl, RN, BC-ADM Inpatient Diabetes Coordinator Pager [phone] (8a-5p)

## 2015-01-04 NOTE — Progress Notes (Signed)
PULMONARY / CRITICAL CARE MEDICINE   Name: [name] MRN: [id number] DOB: Dec 23, 1970    ADMISSION DATE:  01/02/2015  REFERRING MD :  ER  CHIEF COMPLAINT:  Alerted mental status  INITIAL PRESENTATION:  44 yo female presented to ER with dyspnea, altered mental status, hyperglycemia, hyperkalemia, pulmonary edema, possible PNA.  She has hx of ESRD on HD.  Intubated in ER.  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  She presented to the ER with confusion, dyspnea.  She was found to have hyperkalemia, hyperglycemia, pulmonary edema, possible pneumonia.  She was difficulty IV access with multiple attempts by ER staff for CVL placement.  She has her AV graft in her Rt groin.  PCCM asked to assess for respiratory failure.  She required intubation in ER.   SUBJECTIVE:  Agitated  on propofol/fent gtt  VITAL SIGNS: Temp:  [97.1 F (36.2 C)-98.3 F (36.8 C)] 98 F (36.7 C) (08/09 0753) Pulse Rate:  [66-85] 82 (08/09 0800) Resp:  [12-27] 16 (08/09 0800) BP: (81-178)/(35-82) 160/75 mmHg (08/09 0800) SpO2:  [98 %-100 %] 100 % (08/09 0800) FiO2 (%):  [40 %-50 %] 40 % (08/09 0800) Weight:  [152 lb 1.9 oz (69 kg)-161 lb 6 oz (73.2 kg)] 152 lb 1.9 oz (69 kg) (08/08 2121) HEMODYNAMICS:   VENTILATOR SETTINGS: Vent Mode:  [-] PRVC FiO2 (%):  [40 %-50 %] 40 % Set Rate:  [24 bmp] 24 bmp Vt Set:  [440 mL] 440 mL PEEP:  [5 cmH20] 5 cmH20 Plateau Pressure:  [20 cmH20-22 cmH20] 20 cmH20 INTAKE / OUTPUT:  Intake/Output Summary (Last 24 hours) at 01/04/15 0904 Last data filed at 01/04/15 0800  Gross  per 24 hour  Intake 1424.56 ml  Output   2400 ml  Net -975.44 ml    PHYSICAL EXAMINATION: General: chr  ill appearing Neuro:  Somnolent, int agitation, randomly moves extremities HEENT:  Dry mucosa Cardiovascular:  Regular, tachycardic Lungs:  B/l crackles Abdomen:  Soft, non tender, decreased bowel sounds Musculoskeletal:  Decreased muscle bulk, no edema, AV graft Rt groin Skin:  No rashes  LABS:  CBC  Recent Labs Lab 01/02/15 1727 01/04/15 0510  WBC 6.8 6.8  HGB 11.9* 10.5*  HCT 39.9 32.1*  PLT 127* 103*   Coag's No results for input(s): APTT, INR in the last 168 hours.   BMET  Recent Labs Lab 01/03/15 0010 01/03/15 0607 01/04/15 0510  NA 129* 138 138  K 4.0 3.0* 3.1*  CL 92* 99* 100*  CO2 21* 28 27  BUN 54* 9 8  CREATININE 9.54* 2.60* 3.34*  GLUCOSE 568* 67 132*   Electrolytes  Recent Labs Lab 01/02/15 1750 01/03/15 0005 01/03/15 0010 01/03/15 0607 01/04/15 0510  CALCIUM  --   --  9.1 9.2 8.2*  MG 2.2 2.2  --   --   --   PHOS  --   --  2.2* <1.0*  --    Sepsis Markers No results for input(s): LATICACIDVEN, PROCALCITON, O2SATVEN in the last 168 hours.   ABG  Recent Labs Lab 01/02/15  1748 01/02/15 2238  PHART 7.245* 7.286*  PCO2ART 41.0 41.5  PO2ART 66.0* 183.0*   Liver Enzymes  Recent Labs Lab 01/02/15 1727 01/03/15 0010 01/03/15 0607  AST 93*  --   --   ALT 35  --   --   ALKPHOS 160*  --   --   BILITOT 1.7*  --   --   ALBUMIN 3.7 3.6 3.9   Cardiac Enzymes  Recent Labs Lab 01/02/15 1727  TROPONINI <0.03   Glucose  Recent Labs Lab 01/03/15 1923 01/03/15 2349 01/04/15 0344 01/04/15 0355 01/04/15 0423 01/04/15 0752  GLUCAP 124* 295* 41* 42* 164* 107*    Imaging Dg Chest Port 1 View  01/04/2015   CLINICAL DATA:  Ventilator dependent respiratory failure. Followup basilar atelectasis  EXAM: PORTABLE CHEST - 1 VIEW  COMPARISON:  01/03/2015 and earlier.  FINDINGS: Endotracheal tube tip in satisfactory position  projecting approximately 4 cm above the carina. Nasogastric tube tip projects over the fundus of the stomach. Cardiomediastinal silhouette unremarkable and unchanged. Atelectasis in the lung bases, left greater than right, with improved aeration since yesterday. No new pulmonary parenchymal abnormalities.  IMPRESSION: 1. Support apparatus satisfactory. 2. Mild bibasilar atelectasis, left greater than right, with improved aeration since yesterday. 3. No new abnormalities.   Electronically Signed   By: Evangeline Dakin M.D.   On: 01/04/2015 07:59     ASSESSMENT / PLAN:  PULMONARY ETT 8/07 >>  A: Acute respiratory failure in setting of DKA, pulmonary edema, and possible PNA. Hx of asthma. P:   Toelrating SBTs - goal extubation when more awake Prn BDs Hold outpt singulair  CARDIOVASCULAR  A:  Hx of HTN, HLD, chronic diastolic heart failure. Difficult IV access. P:  Continue amlodipine, metoprolol, ASA, simvastatin Prn hydralazine for SBP > 180  RENAL A:   ESRD on HD T/Th/Sat, with hx of failed renal/pancreatic transplant. Hyperkalemia. Hyponatremia. Metabolic acidosis 2nd to DKA. P:   HD per renal    GASTROINTESTINAL A:   Nutrition. Hx of GERD, gastroparesis. Nausea. P:   Protonix for SUP Prn zofran Might need to resume outpt reglan when she starts PO  HEMATOLOGIC A:   Anemia of chronic disease. P:  SQ heparin for DVT prophylaxis  INFECTIOUS A:   Possible pneumonia. 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 P:   Insulin gtt off - SSI with sq lantus Dc dextrose  NEUROLOGIC A:   Acute metabolic encephalopathy. Hx of RLS, migraine HA, depression P:   RASS goal: -1 Hold outpt klonopin, neurontin, oxycodone, ambien, resume once extubated  Summary - Fluid overload ,got   emergent HD,  hope to extubate today DKA -resolved, AG closed  The patient is critically ill with multiple organ systems failure and requires high complexity decision making for assessment and support, frequent evaluation and titration of therapies, application of advanced monitoring technologies and extensive interpretation of multiple databases. Critical Care Time devoted to patient care services described in this note independent of APP time is 32 minutes.   Kara Mead MD. Shade Flood. [place] [facility] Pager [phone] If no response call [phone]    01/04/2015, 9:04 AM

## 2015-01-04 NOTE — Progress Notes (Signed)
ANTIBIOTIC CONSULT NOTE - Follow-Up  Pharmacy Consult for vancomycin, aztreonam Indication: pneumonia  Allergies  Allergen Reactions  . Depakote [Divalproex Sodium] Other (See Comments)    Pt gets "delirious"  . Morphine And Related Nausea And Vomiting and Other (See Comments)    "makes me delirious"  . Penicillins Anaphylaxis    Tolerated Zosyn Dec 2014  . Tramadol Nausea And Vomiting  . Imitrex [Sumatriptan] Other (See Comments)    seizures  . Vicodin [Hydrocodone-Acetaminophen] Itching and Rash    Patient Measurements: Height: 5\' 1"  (154.9 cm) Weight: 152 lb 1.9 oz (69 kg) IBW/kg (Calculated) : 47.8   Vital Signs: Temp: 98 F (36.7 C) (08/09 0753) Temp Source: Oral (08/09 0402) BP: 100/50 mmHg (08/09 1100) Pulse Rate: 100 (08/09 1100) Intake/Output from previous day: 08/08 0701 - 08/09 0700 In: 1436.9 [I.V.:871.9; NG/GT:150; IV Piggyback:415] Out: 2550 [Emesis/NG output:550] Intake/Output from this shift: Total I/O In: 80 [I.V.:50; NG/GT:30] Out: -   Labs:  Recent Labs  01/02/15 1727 01/03/15 0010 01/03/15 0607 01/04/15 0510  WBC 6.8  --   --  6.8  HGB 11.9*  --   --  10.5*  PLT 127*  --   --  103*  CREATININE 9.38* 9.54* 2.60* 3.34*   Estimated Creatinine Clearance: 19.5 mL/min (by C-G formula based on Cr of 3.34). No results for input(s): VANCOTROUGH, VANCOPEAK, VANCORANDOM, GENTTROUGH, GENTPEAK, GENTRANDOM, TOBRATROUGH, TOBRAPEAK, TOBRARND, AMIKACINPEAK, AMIKACINTROU, AMIKACIN in the last 72 hours.   Microbiology: Recent Results (from the past 720 hour(s))  MRSA PCR Screening     Status: None   Collection Time: 01/03/15 12:14 AM  Result Value Ref Range Status   MRSA by PCR NEGATIVE NEGATIVE Final    Comment:        The GeneXpert MRSA Assay (FDA approved for NASAL specimens only), is one component of a comprehensive MRSA colonization surveillance program. It is not intended to diagnose MRSA infection nor to guide or monitor treatment  for MRSA infections.     Medical History: Past Medical History  Diagnosis Date  . Hyperlipidemia   . Alopecia   . Obesity   . Iron deficiency   . ESRD (end stage renal disease)     S/p pancreatic and kidney transplant, but back on HD 2008, dialysis at [facility], [place], Sat  . RLS (restless legs syndrome)   . Injury of conjunctiva and corneal abrasion of right eye without foreign body   . Cellulitis   . Anemia   . Blood transfusion 2004    "when I had my transplant"  . Migraine   . Hyperparathyroidism, secondary   . Diabetic retinopathy   . Hypertension     takes Metoprolol and Exforge daily  . Depression     takes Klonopin nightly  . Diabetes mellitus type 1     Pt states diagnosed at age 44 with prior episodes of DKA. S/p pancreatic transplant   . GERD (gastroesophageal reflux disease)     takes Protonix daily  . Bronchitis   . Migraine   . Asthma   . Emphysema of lung   . Angina     none in past 2 years.  . Stomach pain     Hx: of chronic  . Pneumonia 2012    "double"  . Dialysis patient     tues,thurs,sat  . 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  [facility]gery [facility] at [facility].     . DKA (diabetic ketoacidoses) 12/01/2014  . Diastolic CHF, acute on chronic 01/02/2015    Assessment: 44 YOF presenting with AMS, Hyperglycemia, Hyperkalemia, Possible PNA  PMH: ESRD on HD, HLD, DM, CHF  ID: [id number] for possible PNA WBC 6.8, afebrile.  Vanc 8/7>>8/9      8/7 Vanc 1500 mg x 1 Aztreonam 8/7>>  MRSA PCR Neg Blood 8/8 x 2  Renal: going for emergent HD 8/7 PM, K 3.0, SCr 2.60 HD 8/8 (early AM): BFR 400 ml/min x 4 hrs HD 8/8: BFR 400 ml/min x 4 hrs  Plan:  -d/c vanc consult -aztreonam 1g IV q24h -follow clinical progression, culture data,  LOT  Pharmacy will sign off but are available if needed  Levester Fresh, PharmD, BCPS Clinical Pharmacist Pager [phone] 01/04/2015 11:20 AM

## 2015-01-04 NOTE — Procedures (Signed)
Extubation Procedure Note  Patient Details:   Name: [name] DOB: 11-07-1970 MRN: [id number]   Airway Documentation:     Evaluation  O2 sats: stable throughout Complications: No apparent complications Patient did tolerate procedure well. Bilateral Breath Sounds: Clear, Diminished, Other (Comment) Suctioning: Oral, Airway Yes   Pt extubated to 3L [place] per MD order. Pt Refuses to state her name when asked and instead just "shrugs her shoulders". When pt is told she needs to follow commands or she will need to have the ETT again, she states no and shakes her head. Pt NT suctioned x1 through each nare with copious tan/pink frothy thick secretions obtained. Pt continues to hold her breath for several seconds, then yells out "help me I can't breathe". When pt is asked what is wrong so we can help, she does not respond. Pt coughs, but swallows. Pt encouraged to use yankauer, but is not following commands. RT will closely monitor.   Jesse Sans 01/04/2015, 9:43 AM

## 2015-01-04 NOTE — Progress Notes (Signed)
  [facility] Progress Note   Subjective: 1.7 L off last night, CXR improved edema this am  Filed Vitals:   01/04/15 0740 01/04/15 0753 01/04/15 0800 01/04/15 0900  BP:  156/69 160/75 167/80  Pulse: 78 69 82 106  Temp:  98 F (36.7 C)    TempSrc:      Resp: 17 23 16 14   Height:      Weight:      SpO2: 100% 100% 100% 100%   Exam: On vent, sedated  no jvd Chest clear RRR no RG ABd soft ntnd no ascites L thigh central line R thigh AVG patent Neuro moving all ext  MWF NW 3h 45 min 68kg  2/2.25 bath  R thigh AVG  Heparin ?  Hect 3 / Venofer 50 / Mircera 100 q 2wks      Assessment: 1. DKA improved 2. Resp failure/ pulm edema - improving 3. ESRD on HD 4. Hyperkalemia resolved 5. Hypophosphatemia 6. Anemia hold esa, Hb up  Plan - HD again today, UF to dry wt, IV Na phos    Kelly Splinter MD  pager [phone]    cell [phone]  01/03/2015, 9:05 AM     Recent Labs Lab 01/03/15 0010 01/03/15 0607 01/04/15 0510  NA 129* 138 138  K 4.0 3.0* 3.1*  CL 92* 99* 100*  CO2 21* 28 27  GLUCOSE 568* 67 132*  BUN 54* 9 8  CREATININE 9.54* 2.60* 3.34*  CALCIUM 9.1 9.2 8.2*  PHOS 2.2* <1.0*  --     Recent Labs Lab 01/02/15 1727 01/03/15 0010 01/03/15 0607  AST 93*  --   --   ALT 35  --   --   ALKPHOS 160*  --   --   BILITOT 1.7*  --   --   PROT 7.2  --   --   ALBUMIN 3.7 3.6 3.9    Recent Labs Lab 01/02/15 1727 01/04/15 0510  WBC 6.8 6.8  HGB 11.9* 10.5*  HCT 39.9 32.1*  MCV 105.8* 93.9  PLT 127* 103*   . amLODipine  10 mg Per Tube Daily  . antiseptic oral rinse  7 mL Mouth Rinse QID  . aspirin  325 mg Per Tube Daily  . aztreonam  1 g Intravenous Q24H  . aztreonam  2 g Intravenous Once  . calcium gluconate 1 GM IV  1 g Intravenous Once  . chlorhexidine gluconate  15 mL Mouth Rinse BID  . heparin subcutaneous  5,000 Units Subcutaneous 3 times per day  . insulin aspart  0-15 Units Subcutaneous 6 times per day  . metoprolol tartrate  50  mg Per Tube BID  . ondansetron (ZOFRAN) IV  4 mg Intravenous Once  . pantoprazole sodium  40 mg Per Tube Q24H  . simvastatin  20 mg Per Tube QHS  . sodium bicarbonate  25 mEq Intravenous Once   . insulin (NOVOLIN-R) infusion 16.2 Units/hr (01/02/15 2219)  . norepinephrine (LEVOPHED) Adult infusion 1 mcg/min (01/04/15 0630)   sodium chloride, sodium chloride, albuterol, feeding supplement (NEPRO CARB STEADY), heparin, hydrALAZINE, lidocaine (PF), lidocaine-prilocaine, ondansetron (ZOFRAN) IV, pentafluoroprop-tetrafluoroeth

## 2015-01-04 NOTE — Progress Notes (Signed)
  [facility] Progress Note   Subjective: 2 kg off w HD last night, very somnolent this am and sat's dropping when not awake. Just got IV Narcan 0.2mg .   Filed Vitals:   01/04/15 0900 01/04/15 1000 01/04/15 1100 01/04/15 1131  BP: 167/80 126/63 100/50   Pulse: 106 103 100   Temp:    98 F (36.7 C)  TempSrc:      Resp: 14 18 25    Height:      Weight:      SpO2: 100% 99% 95%    Exam: On vent, sedated  no jvd Chest clear RRR no RG ABd soft ntnd no ascites L thigh central line R thigh AVG patent Neuro moving all ext  MWF NW 3h 45 min 68kg  2/2.25 bath  R thigh AVG  Heparin ?  Hect 3 / Venofer 50 / Mircera 100 q 2wks      Assessment: 1. DKA improved 2. Resp failure/ pulm edema - improved 3. Volume is 1kg over dry wt now 4. AMS - may be just narcotics, seems to be waking up after narcan 5. ESRD on HD 6. Hyperkalemia resolved 7. Hypophosphatemia - supplementing 8. Anemia hold esa, Hb up  Plan - HD tomorrow, max UF as tolerated    Kelly Splinter MD  pager [phone]    cell [phone]  01/03/2015, 9:05 AM     Recent Labs Lab 01/03/15 0010 01/03/15 0607 01/04/15 0510  NA 129* 138 138  K 4.0 3.0* 3.1*  CL 92* 99* 100*  CO2 21* 28 27  GLUCOSE 568* 67 132*  BUN 54* 9 8  CREATININE 9.54* 2.60* 3.34*  CALCIUM 9.1 9.2 8.2*  PHOS 2.2* <1.0*  --     Recent Labs Lab 01/02/15 1727 01/03/15 0010 01/03/15 0607  AST 93*  --   --   ALT 35  --   --   ALKPHOS 160*  --   --   BILITOT 1.7*  --   --   PROT 7.2  --   --   ALBUMIN 3.7 3.6 3.9    Recent Labs Lab 01/02/15 1727 01/04/15 0510  WBC 6.8 6.8  HGB 11.9* 10.5*  HCT 39.9 32.1*  MCV 105.8* 93.9  PLT 127* 103*   . amLODipine  10 mg Per Tube Daily  . antiseptic oral rinse  7 mL Mouth Rinse QID  . aspirin  325 mg Per Tube Daily  . aztreonam  1 g Intravenous Q24H  . aztreonam  2 g Intravenous Once  . calcium gluconate 1 GM IV  1 g Intravenous Once  . chlorhexidine gluconate  15 mL Mouth  Rinse BID  . heparin subcutaneous  5,000 Units Subcutaneous 3 times per day  . insulin aspart  0-15 Units Subcutaneous 6 times per day  . metoprolol tartrate  50 mg Per Tube BID  . naloxone      . ondansetron (ZOFRAN) IV  4 mg Intravenous Once  . pantoprazole sodium  40 mg Per Tube Q24H  . simvastatin  20 mg Per Tube QHS  . sodium bicarbonate  25 mEq Intravenous Once   . insulin (NOVOLIN-R) infusion 16.2 Units/hr (01/02/15 2219)  . norepinephrine (LEVOPHED) Adult infusion Stopped (01/04/15 0840)   sodium chloride, sodium chloride, albuterol, feeding supplement (NEPRO CARB STEADY), heparin, hydrALAZINE, lidocaine (PF), lidocaine-prilocaine, ondansetron (ZOFRAN) IV, pentafluoroprop-tetrafluoroeth

## 2015-01-04 NOTE — Progress Notes (Signed)
Patient became very anxious and agitated after extubation; yelling into hallways, calling for help, and stating she can't breathe. Patient's breath sounds are clear/diminished with sats in upper 90s on 3L [place]. Patient reassured and instructed to take deep breaths to which she holds her breath. Dr. Elsworth Soho notified and order given for Ativan. 0.5mg  IV Ativan given with relief in agitation, but now patient is becoming apneic when she begins to fall asleep. Patient still continues to hold her breath when instructed to breathe. Patient placed on BiPAP by RT with very poor tidal volumes (<100 at times). Dr. Elsworth Soho notified and states he will come assess patient shortly. RN and RT continuing to monitor patient closely at this time.  Vena Austria 01/04/2015 11:44 AM

## 2015-01-04 NOTE — Progress Notes (Signed)
75cc Fentanyl wasted down sink and witnessed by Currie Paris, RN.  Vena Austria 01/04/2015 12:59 PM

## 2015-01-05 ENCOUNTER — Inpatient Hospital Stay (HOSPITAL_COMMUNITY): Payer: Medicare Other

## 2015-01-05 ENCOUNTER — Encounter (HOSPITAL_COMMUNITY): Payer: Self-pay | Admitting: Radiology

## 2015-01-05 LAB — BASIC METABOLIC PANEL
Anion gap: 12 (ref 5–15)
BUN: 17 mg/dL (ref 6–20)
CO2: 29 mmol/L (ref 22–32)
CREATININE: 6.08 mg/dL — AB (ref 0.44–1.00)
Calcium: 7.7 mg/dL — ABNORMAL LOW (ref 8.9–10.3)
Chloride: 100 mmol/L — ABNORMAL LOW (ref 101–111)
GFR, EST AFRICAN AMERICAN: 9 mL/min — AB (ref >60)
GFR, EST NON AFRICAN AMERICAN: 8 mL/min — AB (ref >60)
GLUCOSE: 142 mg/dL — AB (ref 65–99)
Potassium: 4 mmol/L (ref 3.5–5.1)
Sodium: 141 mmol/L (ref 135–145)

## 2015-01-05 LAB — CBC
HEMATOCRIT: 32.1 % — AB (ref 36.0–46.0)
Hemoglobin: 10 g/dL — ABNORMAL LOW (ref 12.0–15.0)
MCH: 31.3 pg (ref 26.0–34.0)
MCHC: 31.2 g/dL (ref 30.0–36.0)
MCV: 100.3 fL — AB (ref 78.0–100.0)
Platelets: 109 10*3/uL — ABNORMAL LOW (ref 150–400)
RBC: 3.2 MIL/uL — ABNORMAL LOW (ref 3.87–5.11)
RDW: 15.9 % — AB (ref 11.5–15.5)
WBC: 5.7 10*3/uL (ref 4.0–10.5)

## 2015-01-05 LAB — GLUCOSE, CAPILLARY
Glucose-Capillary: 110 mg/dL — ABNORMAL HIGH (ref 65–99)
Glucose-Capillary: 126 mg/dL — ABNORMAL HIGH (ref 65–99)
Glucose-Capillary: 138 mg/dL — ABNORMAL HIGH (ref 65–99)
Glucose-Capillary: 185 mg/dL — ABNORMAL HIGH (ref 65–99)
Glucose-Capillary: 189 mg/dL — ABNORMAL HIGH (ref 65–99)
Glucose-Capillary: 240 mg/dL — ABNORMAL HIGH (ref 65–99)

## 2015-01-05 MED ORDER — LIDOCAINE-PRILOCAINE 2.5-2.5 % EX CREA: 1.0000 "application " | TOPICAL_CREAM | CUTANEOUS | Status: DC | PRN

## 2015-01-05 MED ORDER — METOPROLOL TARTRATE 25 MG PO TABS
25.0000 mg | ORAL_TABLET | Freq: Two times a day (BID) | ORAL | Status: DC
  Administered 2015-01-05 – 2015-01-07 (×4): 25 mg via ORAL
  Filled 2015-01-05 (×5): qty 1

## 2015-01-05 MED ORDER — CLONAZEPAM 0.5 MG PO TABS
0.5000 mg | ORAL_TABLET | Freq: Every day | ORAL | Status: DC
  Administered 2015-01-05 – 2015-01-06 (×2): 0.5 mg via ORAL
  Filled 2015-01-05 (×2): qty 1

## 2015-01-05 MED ORDER — SODIUM CHLORIDE 0.9 % IV SOLN: 100.0000 mL | INTRAVENOUS | Status: DC | PRN

## 2015-01-05 MED ORDER — ALTEPLASE 2 MG IJ SOLR
2.0000 mg | Freq: Once | INTRAMUSCULAR | Status: DC | PRN
  Filled 2015-01-05: qty 2

## 2015-01-05 MED ORDER — HEPARIN SODIUM (PORCINE) 1000 UNIT/ML DIALYSIS: 1000.0000 [IU] | INTRAMUSCULAR | Status: DC | PRN

## 2015-01-05 MED ORDER — NEPRO/CARBSTEADY PO LIQD: 237.0000 mL | ORAL | Status: DC | PRN

## 2015-01-05 MED ORDER — VANCOMYCIN HCL IN DEXTROSE 750-5 MG/150ML-% IV SOLN
750.0000 mg | INTRAVENOUS | Status: DC
  Filled 2015-01-05: qty 150

## 2015-01-05 MED ORDER — SODIUM CHLORIDE 0.9 % IV SOLN: INTRAVENOUS | Status: DC

## 2015-01-05 MED ORDER — LORAZEPAM 2 MG/ML IJ SOLN
1.0000 mg | Freq: Once | INTRAMUSCULAR | Status: AC
  Administered 2015-01-05: 1 mg via INTRAVENOUS
  Filled 2015-01-05: qty 1

## 2015-01-05 MED ORDER — LEVOFLOXACIN IN D5W 500 MG/100ML IV SOLN
500.0000 mg | INTRAVENOUS | Status: DC
  Filled 2015-01-05: qty 100

## 2015-01-05 MED ORDER — CLONAZEPAM 1 MG PO TABS: 1.0000 mg | ORAL_TABLET | Freq: Every day | ORAL | Status: DC

## 2015-01-05 MED ORDER — LEVOFLOXACIN IN D5W 750 MG/150ML IV SOLN
750.0000 mg | INTRAVENOUS | Status: AC
  Administered 2015-01-06: 750 mg via INTRAVENOUS
  Filled 2015-01-05: qty 150

## 2015-01-05 MED ORDER — METOCLOPRAMIDE HCL 10 MG PO TABS
10.0000 mg | ORAL_TABLET | Freq: Three times a day (TID) | ORAL | Status: DC
  Administered 2015-01-05 – 2015-01-07 (×8): 10 mg via ORAL
  Filled 2015-01-05 (×10): qty 1

## 2015-01-05 MED ORDER — VANCOMYCIN HCL 10 G IV SOLR
1500.0000 mg | Freq: Two times a day (BID) | INTRAVENOUS | Status: DC
  Administered 2015-01-06: 1500 mg via INTRAVENOUS
  Filled 2015-01-05 (×2): qty 1500

## 2015-01-05 MED ORDER — GABAPENTIN 100 MG PO CAPS
100.0000 mg | ORAL_CAPSULE | Freq: Every day | ORAL | Status: DC
  Administered 2015-01-05 – 2015-01-07 (×3): 100 mg via ORAL
  Filled 2015-01-05 (×3): qty 1

## 2015-01-05 MED ORDER — LIDOCAINE HCL (PF) 1 % IJ SOLN: 5.0000 mL | INTRAMUSCULAR | Status: DC | PRN

## 2015-01-05 MED ORDER — PENTAFLUOROPROP-TETRAFLUOROETH EX AERO: 1.0000 "application " | INHALATION_SPRAY | CUTANEOUS | Status: DC | PRN

## 2015-01-05 MED ORDER — ONDANSETRON HCL 4 MG/2ML IJ SOLN: 4.0000 mg | Freq: Three times a day (TID) | INTRAMUSCULAR | Status: DC | PRN

## 2015-01-05 MED ORDER — HYDROXYZINE HCL 25 MG PO TABS
25.0000 mg | ORAL_TABLET | Freq: Three times a day (TID) | ORAL | Status: DC | PRN
  Administered 2015-01-05: 25 mg via ORAL
  Filled 2015-01-05 (×2): qty 1

## 2015-01-05 MED ORDER — PENTAFLUOROPROP-TETRAFLUOROETH EX AERO
INHALATION_SPRAY | CUTANEOUS | Status: AC
  Administered 2015-01-05: 18:00:00
  Filled 2015-01-05: qty 103.5

## 2015-01-05 MED ORDER — PANTOPRAZOLE SODIUM 40 MG PO TBEC
40.0000 mg | DELAYED_RELEASE_TABLET | Freq: Every day | ORAL | Status: DC
  Administered 2015-01-05 – 2015-01-07 (×3): 40 mg via ORAL
  Filled 2015-01-05 (×3): qty 1

## 2015-01-05 MED ORDER — ACETAMINOPHEN 650 MG RE SUPP
650.0000 mg | Freq: Four times a day (QID) | RECTAL | Status: DC | PRN
  Filled 2015-01-05: qty 1

## 2015-01-05 NOTE — Progress Notes (Signed)
ANTIBIOTIC CONSULT NOTE - Initial  Pharmacy Consult for vancomycin, levaquin Indication: fever   Allergies  Allergen Reactions  . Depakote [Divalproex Sodium] Other (See Comments)    Pt gets "delirious"  . Morphine And Related Nausea And Vomiting and Other (See Comments)    "makes me delirious"  . Penicillins Anaphylaxis    Tolerated Zosyn Dec 2014  . Tramadol Nausea And Vomiting  . Imitrex [Sumatriptan] Other (See Comments)    seizures  . Vicodin [Hydrocodone-Acetaminophen] Itching and Rash    Patient Measurements: Height: 5\' 1"  (154.9 cm) Weight: 148 lb 5.9 oz (67.3 kg) (stood to scale ) IBW/kg (Calculated) : 47.8   Vital Signs: Temp: 102.2 F (39 C) (08/10 2143) Temp Source: Oral (08/10 2143) BP: 128/54 mmHg (08/10 2143) Pulse Rate: 101 (08/10 2143) Intake/Output from previous day: 08/09 0701 - 08/10 0700 In: 370 [P.O.:240; I.V.:50; NG/GT:30; IV Piggyback:50] Out: -  Intake/Output from this shift: Total I/O In: -  Out: 1513 [Other:1513]  Labs:  Recent Labs  01/03/15 0607 01/04/15 0510 01/05/15 0425  WBC  --  6.8 5.7  HGB  --  10.5* 10.0*  PLT  --  103* 109*  CREATININE 2.60* 3.34* 6.08*   Estimated Creatinine Clearance: 10.6 mL/min (by C-G formula based on Cr of 6.08). No results for input(s): VANCOTROUGH, VANCOPEAK, VANCORANDOM, GENTTROUGH, GENTPEAK, GENTRANDOM, TOBRATROUGH, TOBRAPEAK, TOBRARND, AMIKACINPEAK, AMIKACINTROU, AMIKACIN in the last 72 hours.   Microbiology: Recent Results (from the past 720 hour(s))  MRSA PCR Screening     Status: None   Collection Time: 01/03/15 12:14 AM  Result Value Ref Range Status   MRSA by PCR NEGATIVE NEGATIVE Final    Comment:        The GeneXpert MRSA Assay (FDA approved for NASAL specimens only), is one component of a comprehensive MRSA colonization surveillance program. It is not intended to diagnose MRSA infection nor to guide or monitor treatment for MRSA infections.   Culture, blood (routine x 2)      Status: None (Preliminary result)   Collection Time: 01/03/15 12:18 AM  Result Value Ref Range Status   Specimen Description BLOOD CENTRAL LINE  Final   Special Requests BOTTLES DRAWN AEROBIC ONLY 8CC  Final   Culture NO GROWTH 2 DAYS  Final   Report Status PENDING  Incomplete  Culture, blood (routine x 2)     Status: None (Preliminary result)   Collection Time: 01/03/15 12:20 AM  Result Value Ref Range Status   Specimen Description BLOOD RIGHT HAND  Final   Special Requests IN PEDIATRIC BOTTLE 2CC  Final   Culture NO GROWTH 2 DAYS  Final   Report Status PENDING  Incomplete    Medical History: Past Medical History  Diagnosis Date  . Hyperlipidemia   . Alopecia   . Obesity   . Iron deficiency   . ESRD (end stage renal disease)     S/p pancreatic and kidney transplant, but back on HD 2008, dialysis at [facility], [place], Sat  . RLS (restless legs syndrome)   . Injury of conjunctiva and corneal abrasion of right eye without foreign body   . Cellulitis   . Anemia   . Blood transfusion 2004    "when I had my transplant"  . Migraine   . Hyperparathyroidism, secondary   . Diabetic retinopathy   . Hypertension     takes Metoprolol and Exforge daily  . Depression     takes Klonopin nightly  . Diabetes mellitus type 1  Pt states diagnosed at age 21 with prior episodes of DKA. S/p pancreatic transplant   . GERD (gastroesophageal reflux disease)     takes Protonix daily  . Bronchitis   . Migraine   . Asthma   . Emphysema of lung   . Angina     none in past 2 years.  . Stomach pain     Hx: of chronic  . Pneumonia 2012    "double"  . Dialysis patient     tues,thurs,sat  . 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  Surgery was done at [facility].     . DKA  (diabetic ketoacidoses) 12/01/2014  . Diastolic CHF, acute on chronic 01/02/2015    Assessment: 44 YOF known to pharmacy from abx dosing for PNA. Abx were narrowed to Aztreonam on 8/9. Now pt with fever post-HD and rebroadening abx to Levaquin and Vancomycin. Vanc load 1.5gm given 8/7 a.m. - pt with 2 HDs since that time on 8/8 and 8/9 - tolerated both. ESRD (M/W/F HD as o/p). Tm 102.2 WBC wnl. Cx ngtd.  Plan:  -Levaquin 750mg  IV now then 500mg  IV q48h -Vancomycin 1500mg  IV load then 750mg  IV qHD -F/u HD tolerance/schedule, micro data, and pt's clinical condition  Sherlon Handing, PharmD, BCPS Clinical pharmacist, pager [phone] 01/05/2015 11:34 PM

## 2015-01-05 NOTE — Care Management Important Message (Signed)
Important Message  Patient Details  Name: [name] MRN: [id number] Date of Birth: [birth year]   Medicare Important Message Given:  [facility] notification given    Nathen May 01/05/2015, 11:49 AMImportant Message  Patient Details  Name: [name] MRN: [id number] Date of Birth: [birth year]   Medicare Important Message Given:  Yes-second notification given    Nathen May 01/05/2015, 11:48 AM

## 2015-01-05 NOTE — Progress Notes (Signed)
eLink Physician-Brief Progress Note Patient Name: [name] DOB: [birth year] MRN: [id number]   Date of Service  01/05/2015  HPI/Events of Note  Fever to 102.2 post hemodialysis.  eICU Interventions  Will order: 1. Urine Culture. 2. Blood Culture X 2. 3. Portable CXR now. 4. Tylenol Suppository. 5. Change urine pregnancy to qualitative serum pregnancy.  6. Vancomycin per pharmacy. 7. Levaquin per pharmacy pending results from pregnancy test.      Intervention Category Intermediate Interventions: Infection - evaluation and management  Sommer,Steven Eugene 01/05/2015, 11:00 PM

## 2015-01-05 NOTE — Progress Notes (Signed)
eLink Physician-Brief Progress Note Patient Name: [name] DOB: 09-Apr-1971 MRN: [id number]   Date of Service  01/05/2015  HPI/Events of Note  Patient says that she is pregnant.   eICU Interventions  Will order urine pregnancy test.         Lysle Dingwall 01/05/2015, 5:43 PM

## 2015-01-05 NOTE — Progress Notes (Signed)
PULMONARY / CRITICAL CARE MEDICINE   Name: [name] MRN: [id number] DOB: 15-May-1971    ADMISSION DATE:  01/02/2015  REFERRING MD :  ER  CHIEF COMPLAINT:  Alerted mental status  INITIAL PRESENTATION:  44 yo female presented to ER with dyspnea, altered mental status, hyperglycemia, hyperkalemia, pulmonary edema, possible PNA.  She has hx of ESRD on HD.  Intubated in ER.  STUDIES:   SIGNIFICANT EVENTS: 8/07 Admit, insulin gtt started, renal consulted, intubated in ER 8/7 emergent HD 8/8 HD  8/9 extubated   HISTORY OF PRESENT ILLNESS:   44 yo female with hx of ESRD after failed renal/pancreatic transplant had her HD day changed last week to accommodate her daughter's birthday >> normally Saturday, but had her date changed to Friday >> only ran for 2.75 hours instead of 3.75 hrs, and left 0.9 kg above her dry weight.  She presented to the ER with confusion, dyspnea.  She was found to have hyperkalemia, hyperglycemia, pulmonary edema, possible pneumonia.  She was difficulty IV access with multiple attempts by ER staff for CVL placement.  She has her AV graft in her Rt groin.  PCCM asked to assess for respiratory failure.  She required intubation in ER.   SUBJECTIVE: C/o itching , denies pain, dyspnea Voice OK, good cough  VITAL SIGNS: Temp:  [98 F (36.7 C)-100.2 F (37.9 C)] 98.6 F (37 C) (08/10 0753) Pulse Rate:  [85-118] 93 (08/10 0900) Resp:  [12-25] 14 (08/10 0900) BP: (100-157)/(50-97) 157/81 mmHg (08/10 0900) SpO2:  [90 %-100 %] 98 % (08/10 0900) FiO2 (%):  [30 %] 30 % (08/09 1131) HEMODYNAMICS:   VENTILATOR SETTINGS: Vent Mode:  [-] Other (Comment) FiO2 (%):  [30 %] 30 % Set Rate:  [12 bmp] 12 bmp PEEP:  [5 cmH20] 5 cmH20 INTAKE / OUTPUT:  Intake/Output Summary (Last 24 hours) at 01/05/15 0955 Last data filed at 01/05/15 0948  Gross per 24 hour  Intake    930 ml  Output      0 ml  Net    930 ml    PHYSICAL EXAMINATION: General: chr  ill  appearing Neuro:  Alert, interactive HEENT:  Dry mucosa, blind left eye Cardiovascular:  Regular, tachycardic Lungs:  B/l crackles Abdomen:  Soft, non tender, decreased bowel sounds Musculoskeletal:  Decreased muscle bulk, no edema, AV graft Rt groin Skin:  No rashes  LABS:  CBC  Recent Labs Lab 01/02/15 1727 01/04/15 0510 01/05/15 0425  WBC 6.8 6.8 5.7  HGB 11.9* 10.5* 10.0*  HCT 39.9 32.1* 32.1*  PLT 127* 103* 109*   Coag's No results for input(s): APTT, INR in the last 168 hours.   BMET  Recent Labs Lab 01/03/15 0607 01/04/15 0510 01/05/15 0425  NA 138 138 141  K 3.0* 3.1* 4.0  CL 99* 100* 100*  CO2 28 27 29   BUN 9 8 17   CREATININE 2.60* 3.34* 6.08*  GLUCOSE 67 132* 142*   Electrolytes  Recent Labs Lab 01/02/15 1750 01/03/15 0005 01/03/15 0010 01/03/15 0607 01/04/15 0510 01/04/15 1845 01/05/15 0425  CALCIUM  --   --  9.1 9.2 8.2*  --  7.7*  MG 2.2 2.2  --   --   --   --   --   PHOS  --   --  2.2* <1.0*  --  4.5  --    Sepsis Markers No results for input(s): LATICACIDVEN, PROCALCITON, O2SATVEN in the last 168 hours.   ABG  Recent Labs Lab  01/02/15 1748 01/02/15 2238 01/04/15 1217  PHART 7.245* 7.286* 7.371  PCO2ART 41.0 41.5 47.4*  PO2ART 66.0* 183.0* 102.0*   Liver Enzymes  Recent Labs Lab 01/02/15 1727 01/03/15 0010 01/03/15 0607  AST 93*  --   --   ALT 35  --   --   ALKPHOS 160*  --   --   BILITOT 1.7*  --   --   ALBUMIN 3.7 3.6 3.9   Cardiac Enzymes  Recent Labs Lab 01/02/15 1727  TROPONINI <0.03   Glucose  Recent Labs Lab 01/04/15 1601 01/04/15 1731 01/04/15 1939 01/04/15 2357 01/05/15 0327 01/05/15 0749  GLUCAP 64* 174* 309* 185* 110* 189*    Imaging No results found.   ASSESSMENT / PLAN:  PULMONARY ETT 8/07 >> 8/9 A: Acute respiratory failure in setting of DKA, pulmonary edema, and possible PNA. Hx of asthma. P:   Prn BDs Hold outpt singulair  CARDIOVASCULAR  A:  Hx of HTN, HLD, chronic  diastolic heart failure. Lt fem CVL 8/7 >> P:  Continue amlodipine, metoprolol, ASA, simvastatin Prn hydralazine for SBP > 180 Remove CVL on dc  RENAL A:   ESRD on HD T/Th/Sat, with hx of failed renal/pancreatic transplant. Hyperkalemia. Hyponatremia. Metabolic acidosis 2nd to DKA. P:   HD per renal    GASTROINTESTINAL A:   Hx of GERD, gastroparesis. Nausea. P:   Prn zofran Might need to resume outpt reglan    INFECTIOUS A:   Possible pneumonia. P:   8/8 vancomycin, azactam >> 8/10  Blood 8/07 >>ng Sputum 8/07 >>  ENDOCRINE A:   DKA -AG resolved Hx of DM type 1 with retinopathy, neuropathy. 2nd hyperparathyroidism. Hypoglycemia 8/9 P:   SSI with sq lantus 5 Dc dextrose  NEUROLOGIC A:   Acute metabolic encephalopathy. Hx of RLS, migraine HA, depression P:   Resume outpt klonopin, neurontin, oxycodone, ambien  Summary - Fluid overload ,got  emergent HD,  extubated  DKA -resolved, AG closed  Transfer to floor, hope to discharge soon  Kara Mead MD. Shade Flood. [facility] Pager [phone] If no response call [phone]    01/05/2015, 9:55 AM

## 2015-01-05 NOTE — Evaluation (Signed)
Physical Therapy Evaluation Patient Details Name: [name] MRN: [id number] DOB: 1970/10/21 Today's Date: 01/05/2015   History of Present Illness  44 yo female presented to ER with dyspnea, altered mental status, hyperglycemia, hyperkalemia, pulmonary edema, possible PNA. She has hx of ESRD on HD. Intubated in ER. Extubated 8/9  Clinical Impression  Pt admitted with above complications. Pt currently with functional limitations due to the deficits listed below (see PT Problem List). Demonstrates significant drifting towards left and notable neglect towards left while ambulating. Requires min assist for balance to prevent falls, improves moderately with use of a rolling walker for support. Reports she had a "mini-stroke" in November, resulting in Rt sided paraesthesias. Today, [name] is demonstrating a dysmetric LUE, with Lt pronator drift, and dysdiadochokinesia. At baseline she states that she does not use an assistive device and is independent at home. Reports having >2 falls per month. RN notified of these new symptoms which were not noted by physical therapy during previous hospital admissions. Pt will benefit from skilled PT to increase their independence and safety with mobility to allow discharge to the venue listed below.       Follow Up Recommendations SNF;Supervision/Assistance - 24 hour    Equipment Recommendations  Rolling walker with 5" wheels    Recommendations for Other Services OT consult     Precautions / Restrictions Precautions Precautions: Fall Restrictions Weight Bearing Restrictions: No      Mobility  Bed Mobility Overal bed mobility: Needs Assistance Bed Mobility: Supine to Sit     Supine to sit: Supervision     General bed mobility comments: supervision for safety. No assist was needed.  Transfers Overall transfer level: Needs assistance Equipment used: Rolling walker (2 wheeled) Transfers: Sit to/from Stand Sit to Stand: Min assist          General transfer comment: Able to boost self safely however with moderate sway upon standing requiring physical assist to correct  Ambulation/Gait Ambulation/Gait assistance: Min assist Ambulation Distance (Feet): 150 Feet Assistive device: Rolling walker (2 wheeled) Gait Pattern/deviations: Step-through pattern;Decreased stride length;Shuffle;Drifts right/left Gait velocity: slow Gait velocity interpretation: Below normal speed for age/gender General Gait Details: Frequent assistance for walker control. Drifting heavily towards her left side. Educated on RW use for safety and VC provided for scanning enironment to avoid running into objects. States she feels unstable especially compared to baseline.  Stairs            Wheelchair Mobility    Modified Rankin (Stroke Patients Only) Modified Rankin (Stroke Patients Only) Pre-Morbid Rankin Score: No significant disability Modified Rankin: Moderately severe disability     Balance Overall balance assessment: Needs assistance;History of Falls Sitting-balance support: No upper extremity supported Sitting balance-Leahy Scale: Fair     Standing balance support: Bilateral upper extremity supported Standing balance-Leahy Scale: Poor                               Pertinent Vitals/Pain Pain Assessment: 0-10 Pain Score:  (No value given "my leg always hurts") Pain Location: "dialysis" site Pain Descriptors / Indicators: Constant Pain Intervention(s): Monitored during session;Repositioned    Home Living Family/patient expects to be discharged to:: Private residence Living Arrangements: Spouse/significant other Available Help at Discharge: Friend(s);Available PRN/intermittently Type of Home: Apartment Home Access: Level entry     Home Layout: One level Home Equipment: None Additional Comments: Lives with boyfriend however he has just started a new job  Prior Function Level of Independence: Independent                Hand Dominance   Dominant Hand: Right    Extremity/Trunk Assessment   Upper Extremity Assessment: Defer to OT evaluation (abnormal FNF test on Lt with dysmetria)           Lower Extremity Assessment: Generalized weakness (Reports Hx of RLE paraesthesias)         Communication   Communication: No difficulties  Cognition Arousal/Alertness: Awake/alert Behavior During Therapy: WFL for tasks assessed/performed Overall Cognitive Status: Within Functional Limits for tasks assessed                      General Comments General comments (skin integrity, edema, etc.): Dysmetric LUE, Lt pronator drift.    Exercises        Assessment/Plan    PT Assessment Patient needs continued PT services  PT Diagnosis Difficulty walking;Abnormality of gait;Generalized weakness   PT Problem List Decreased activity tolerance;Decreased balance;Decreased mobility;Decreased coordination;Decreased knowledge of use of DME;Cardiopulmonary status limiting activity;Impaired sensation;Pain  PT Treatment Interventions DME instruction;Gait training;Functional mobility training;Therapeutic activities;Therapeutic exercise;Balance training;Neuromuscular re-education;Patient/family education;Modalities   PT Goals (Current goals can be found in the Care Plan section) Acute Rehab PT Goals Patient Stated Goal: none stated PT Goal Formulation: With patient Time For Goal Achievement: 01/19/15 Potential to Achieve Goals: Good    Frequency Min 3X/week   Barriers to discharge Decreased caregiver support boyfriend works    Co-evaluation               End of Session Equipment Utilized During Treatment: Gait belt;Oxygen (95% on 1L supplemental O2) Activity Tolerance: Patient tolerated treatment well Patient left: with nursing/sitter in room (In w/c waiting for transfer to unit with NT) Nurse Communication: Mobility status;Other (comment) (Lt side dysmetria, neglect, and  drifting)         Time: 2876-8115 PT Time Calculation (min) (ACUTE ONLY): 24 min   Charges:   PT Evaluation $Initial PT Evaluation Tier I: 1 Procedure PT Treatments $Gait Training: 8-22 mins   PT G CodesEllouise [name] 01/05/2015, 2:13 PM [name] [place], [place]

## 2015-01-05 NOTE — Progress Notes (Signed)
Report given to [facility].  Pt has no s/s of any acute distress.  Left via wc at this time

## 2015-01-05 NOTE — Progress Notes (Signed)
Notified Dr. Madalyn Rob of PTs assessment, "stating pt weaker in left lower ext with h/o cva"  NIH score 0.

## 2015-01-05 NOTE — Progress Notes (Signed)
  [facility] Progress Note   Subjective: Fully alert today, off narcotics   Filed Vitals:   01/05/15 0800 01/05/15 0900 01/05/15 1000 01/05/15 1100  BP: 120/97 157/81  121/60  Pulse: 90 93 84 77  Temp:      TempSrc:      Resp: 18 14 15 13   Height:      Weight:      SpO2: 100% 98% 95% 97%   Exam: Alert, looking at her phone  no jvd Chest clear RRR no RG ABd soft ntnd no ascites L thigh central line R thigh AVG patent No LE or UE edema Neuro nf, ox 3  MWF NW 3h 45 min 68kg  2/2.25 bath  R thigh AVG  Heparin ?  Hect 3 / Venofer 50 / Mircera 100 q 2wks      Assessment: 1. DKA resolved 2. Resp failure/ pulm edema - resolved 3. Volume is euvolemic, wt pending today 4. AMS - due to meds, resolved 5. ESRD on HD 6. Hyperkalemia - resolved 7. Hypophosphatemia - resolved 8. Anemia hold esa, Hb up  Plan - HD today, UF as tolerated    Kelly Splinter MD  pager [phone]    cell [phone]  01/03/2015, 9:05 AM     Recent Labs Lab 01/03/15 0010 01/03/15 0607 01/04/15 0510 01/04/15 1845 01/05/15 0425  NA 129* 138 138  --  141  K 4.0 3.0* 3.1*  --  4.0  CL 92* 99* 100*  --  100*  CO2 21* 28 27  --  29  GLUCOSE 568* 67 132*  --  142*  BUN 54* 9 8  --  17  CREATININE 9.54* 2.60* 3.34*  --  6.08*  CALCIUM 9.1 9.2 8.2*  --  7.7*  PHOS 2.2* <1.0*  --  4.5  --     Recent Labs Lab 01/02/15 1727 01/03/15 0010 01/03/15 0607  AST 93*  --   --   ALT 35  --   --   ALKPHOS 160*  --   --   BILITOT 1.7*  --   --   PROT 7.2  --   --   ALBUMIN 3.7 3.6 3.9    Recent Labs Lab 01/02/15 1727 01/04/15 0510 01/05/15 0425  WBC 6.8 6.8 5.7  HGB 11.9* 10.5* 10.0*  HCT 39.9 32.1* 32.1*  MCV 105.8* 93.9 100.3*  PLT 127* 103* 109*   . amLODipine  10 mg Per Tube Daily  . antiseptic oral rinse  7 mL Mouth Rinse QID  . aspirin  325 mg Per Tube Daily  . calcium gluconate 1 GM IV  1 g Intravenous Once  . chlorhexidine gluconate  15 mL Mouth Rinse BID  .  clonazePAM  0.5 mg Oral QHS  . gabapentin  100 mg Oral Daily  . heparin subcutaneous  5,000 Units Subcutaneous 3 times per day  . insulin aspart  0-5 Units Subcutaneous 6 times per day  . insulin glargine  5 Units Subcutaneous QHS  . metoCLOPramide  10 mg Oral TID AC & HS  . metoprolol tartrate  25 mg Oral BID  . pantoprazole  40 mg Oral Daily  . simvastatin  20 mg Per Tube QHS     sodium chloride, sodium chloride, albuterol, heparin, hydrALAZINE, hydrOXYzine, lidocaine (PF), lidocaine-prilocaine, naLOXone (NARCAN)  injection, ondansetron (ZOFRAN) IV, pentafluoroprop-tetrafluoroeth

## 2015-01-06 DIAGNOSIS — I5032 Chronic diastolic (congestive) heart failure: Secondary | ICD-10-CM

## 2015-01-06 DIAGNOSIS — I10 Essential (primary) hypertension: Secondary | ICD-10-CM

## 2015-01-06 DIAGNOSIS — J9601 Acute respiratory failure with hypoxia: Secondary | ICD-10-CM

## 2015-01-06 DIAGNOSIS — E875 Hyperkalemia: Secondary | ICD-10-CM

## 2015-01-06 DIAGNOSIS — K219 Gastro-esophageal reflux disease without esophagitis: Secondary | ICD-10-CM

## 2015-01-06 DIAGNOSIS — E1010 Type 1 diabetes mellitus with ketoacidosis without coma: Secondary | ICD-10-CM

## 2015-01-06 DIAGNOSIS — N186 End stage renal disease: Secondary | ICD-10-CM

## 2015-01-06 DIAGNOSIS — Z992 Dependence on renal dialysis: Secondary | ICD-10-CM

## 2015-01-06 LAB — RENAL FUNCTION PANEL
Albumin: 3 g/dL — ABNORMAL LOW (ref 3.5–5.0)
Anion gap: 10 (ref 5–15)
BUN: 13 mg/dL (ref 6–20)
CHLORIDE: 99 mmol/L — AB (ref 101–111)
CO2: 29 mmol/L (ref 22–32)
CREATININE: 5.68 mg/dL — AB (ref 0.44–1.00)
Calcium: 7.4 mg/dL — ABNORMAL LOW (ref 8.9–10.3)
GFR calc Af Amer: 10 mL/min — ABNORMAL LOW (ref >60)
GFR, EST NON AFRICAN AMERICAN: 8 mL/min — AB (ref >60)
GLUCOSE: 176 mg/dL — AB (ref 65–99)
Phosphorus: 3.8 mg/dL (ref 2.5–4.6)
Potassium: 4.2 mmol/L (ref 3.5–5.1)
Sodium: 138 mmol/L (ref 135–145)

## 2015-01-06 LAB — CBC
HEMATOCRIT: 31.3 % — AB (ref 36.0–46.0)
Hemoglobin: 9.6 g/dL — ABNORMAL LOW (ref 12.0–15.0)
MCH: 30.7 pg (ref 26.0–34.0)
MCHC: 30.7 g/dL (ref 30.0–36.0)
MCV: 100 fL (ref 78.0–100.0)
PLATELETS: 118 10*3/uL — AB (ref 150–400)
RBC: 3.13 MIL/uL — ABNORMAL LOW (ref 3.87–5.11)
RDW: 15.4 % (ref 11.5–15.5)
WBC: 4.7 10*3/uL (ref 4.0–10.5)

## 2015-01-06 LAB — GLUCOSE, CAPILLARY
GLUCOSE-CAPILLARY: 100 mg/dL — AB (ref 65–99)
GLUCOSE-CAPILLARY: 207 mg/dL — AB (ref 65–99)
Glucose-Capillary: 326 mg/dL — ABNORMAL HIGH (ref 65–99)
Glucose-Capillary: 125 mg/dL — ABNORMAL HIGH (ref 65–99)
Glucose-Capillary: 219 mg/dL — ABNORMAL HIGH (ref 65–99)
Glucose-Capillary: 372 mg/dL — ABNORMAL HIGH (ref 65–99)

## 2015-01-06 LAB — HCG, SERUM, QUALITATIVE: PREG SERUM: NEGATIVE

## 2015-01-06 MED ORDER — NEPRO/CARBSTEADY PO LIQD
237.0000 mL | ORAL | Status: DC | PRN
  Filled 2015-01-06: qty 237

## 2015-01-06 MED ORDER — SODIUM CHLORIDE 0.9 % IV SOLN: 100.0000 mL | INTRAVENOUS | Status: DC | PRN

## 2015-01-06 MED ORDER — LIDOCAINE HCL (PF) 1 % IJ SOLN: 5.0000 mL | INTRAMUSCULAR | Status: DC | PRN

## 2015-01-06 MED ORDER — SODIUM CHLORIDE 0.9 % IJ SOLN
10.0000 mL | Freq: Two times a day (BID) | INTRAMUSCULAR | Status: DC
Start: 2015-01-06 — End: 2015-01-07
  Administered 2015-01-06: 10 mL

## 2015-01-06 MED ORDER — ALTEPLASE 2 MG IJ SOLR
2.0000 mg | Freq: Once | INTRAMUSCULAR | Status: DC | PRN
  Filled 2015-01-06: qty 2

## 2015-01-06 MED ORDER — HEPARIN SODIUM (PORCINE) 1000 UNIT/ML DIALYSIS
1000.0000 [IU] | INTRAMUSCULAR | Status: DC | PRN
  Filled 2015-01-06: qty 1

## 2015-01-06 MED ORDER — SODIUM CHLORIDE 0.9 % IJ SOLN
10.0000 mL | INTRAMUSCULAR | Status: DC | PRN
  Administered 2015-01-06 – 2015-01-07 (×2): 10 mL
  Filled 2015-01-06: qty 40

## 2015-01-06 MED ORDER — NEPRO/CARBSTEADY PO LIQD
237.0000 mL | Freq: Two times a day (BID) | ORAL | Status: DC
  Administered 2015-01-06: 237 mL via ORAL
  Filled 2015-01-06 (×6): qty 237

## 2015-01-06 MED ORDER — PENTAFLUOROPROP-TETRAFLUOROETH EX AERO: 1.0000 "application " | INHALATION_SPRAY | CUTANEOUS | Status: DC | PRN

## 2015-01-06 MED ORDER — ALBUTEROL SULFATE (2.5 MG/3ML) 0.083% IN NEBU
2.5000 mg | INHALATION_SOLUTION | Freq: Four times a day (QID) | RESPIRATORY_TRACT | Status: DC
  Administered 2015-01-06 – 2015-01-07 (×7): 2.5 mg via RESPIRATORY_TRACT
  Filled 2015-01-06 (×7): qty 3

## 2015-01-06 MED ORDER — LIDOCAINE-PRILOCAINE 2.5-2.5 % EX CREA
1.0000 "application " | TOPICAL_CREAM | CUTANEOUS | Status: DC | PRN
  Filled 2015-01-06: qty 5

## 2015-01-06 MED ORDER — ACETAMINOPHEN 500 MG PO TABS
500.0000 mg | ORAL_TABLET | Freq: Once | ORAL | Status: AC
  Administered 2015-01-06: 500 mg via ORAL
  Filled 2015-01-06: qty 1

## 2015-01-06 NOTE — Progress Notes (Signed)
[facility] PROGRESS NOTE  [name] IRC:[id number] DOB: 1970/07/21 DOA: 01/02/2015 PCP: [name], MD  Assessment/Plan: 1-Acute resp failure: with need of ventilatory support for 48 hours -improved and at baseline regarding chronic resp failure with oxygen dependency  -no CP, denies cough -very mild wheezing appreciated on exam -will use as needed nebulizer tx and start flutter valve  2-chronic resp failure due to Asthma -continue dulera -holding outpatient singulair -continue O2 supplementation   3-chronic diastolic HF:  -volume adjusted with HD -appreciate renal service assistance and rec's -on T-T-S schedule  4-anemia of chronic disease: -Hgb stable -epogen and IV iron as per renal discretion  5-fever and presumed HCAP -x-ray is not impressive; but patient with high grade temp -will continue vanc and levaquin -follow cultures  6-hx of diabetes type 1 uncontrolled -presented on DKA, resolved now -will continue SSI and lantus -will adjust as needed  7-anxiety: will resume home meds  8-GERD -continue PPI  9-Hyperkalemia/hyperfosfatemia: Per renal -expecting resolution/control with HD   Code Status: Full Family Communication: no family at bedside Disposition Plan: PT recommending SNF; patient has declined it. Plan is for home with [facility] services when medically stable   Consultants:  PCCM  Renal service   Procedures:  See below for x-ray reports   Required intubation to protect airways since 01/02/15 until 01/04/15  Antibiotics:  vanc and levaquin 8/10  HPI/Subjective: Afebrile currently; but with high grade temp overnight. Denies CP, SOB or any other complaints.  Objective: Filed Vitals:   01/06/15 1949  BP: 124/62  Pulse: 87  Temp: 98.9 F (37.2 C)  Resp: 13    Intake/Output Summary (Last 24 hours) at 01/06/15 2300 Last data filed at 01/06/15 1846  Gross per 24 hour  Intake    380 ml  Output      0 ml  Net    380  ml   Filed Weights   01/05/15 1131 01/05/15 1712 01/05/15 2030  Weight: 69.8 kg (153 lb 14.1 oz) 69.3 kg (152 lb 12.5 oz) 67.3 kg (148 lb 5.9 oz)    Exam:   General:  High temp overnight, no fever this morning, denies CP and SOB. Patient is AAOX3 and cooperative with exam.  Cardiovascular: S1 and S2, positive murmur, no rubs or gallops  Respiratory: no wheezing, no crackles  Abdomen: soft, NT, positive BS  Musculoskeletal: no edema or cyanosis   Data Reviewed: Basic Metabolic Panel:  Recent Labs Lab 01/02/15 1750 01/03/15 0005 01/03/15 0010 01/03/15 0607 01/04/15 0510 01/04/15 1845 01/05/15 0425 01/06/15 1500  NA  --   --  129* 138 138  --  141 138  K  --   --  4.0 3.0* 3.1*  --  4.0 4.2  CL  --   --  92* 99* 100*  --  100* 99*  CO2  --   --  21* 28 27  --  29 29  GLUCOSE  --   --  568* 67 132*  --  142* 176*  BUN  --   --  54* 9 8  --  17 13  CREATININE  --   --  9.54* 2.60* 3.34*  --  6.08* 5.68*  CALCIUM  --   --  9.1 9.2 8.2*  --  7.7* 7.4*  MG 2.2 2.2  --   --   --   --   --   --   PHOS  --   --  2.2* <1.0*  --  4.5  --  3.8   Liver Function Tests:  Recent Labs Lab 01/02/15 1727 01/03/15 0010 01/03/15 0607 01/06/15 1500  AST 93*  --   --   --   ALT 35  --   --   --   ALKPHOS 160*  --   --   --   BILITOT 1.7*  --   --   --   PROT 7.2  --   --   --   ALBUMIN 3.7 3.6 3.9 3.0*   CBC:  Recent Labs Lab 01/02/15 1727 01/04/15 0510 01/05/15 0425 01/06/15 1507  WBC 6.8 6.8 5.7 4.7  HGB 11.9* 10.5* 10.0* 9.6*  HCT 39.9 32.1* 32.1* 31.3*  MCV 105.8* 93.9 100.3* 100.0  PLT 127* 103* 109* 118*   Cardiac Enzymes:  Recent Labs Lab 01/02/15 1727  TROPONINI <0.03   BNP (last 3 results)  Recent Labs  01/02/15 1727  BNP 2030.1*    ProBNP (last 3 results)  Recent Labs  01/14/14 0216  PROBNP 17541.0*    CBG:  Recent Labs Lab 01/06/15 0447 01/06/15 0802 01/06/15 1214 01/06/15 1645 01/06/15 1946  GLUCAP 207* 326* 125* 219* 372*     Recent Results (from the past 240 hour(s))  MRSA PCR Screening     Status: None   Collection Time: 01/03/15 12:14 AM  Result Value Ref Range Status   MRSA by PCR NEGATIVE NEGATIVE Final    Comment:        The GeneXpert MRSA Assay (FDA approved for NASAL specimens only), is one component of a comprehensive MRSA colonization surveillance program. It is not intended to diagnose MRSA infection nor to guide or monitor treatment for MRSA infections.   Culture, blood (routine x 2)     Status: None (Preliminary result)   Collection Time: 01/03/15 12:18 AM  Result Value Ref Range Status   Specimen Description BLOOD CENTRAL LINE  Final   Special Requests BOTTLES DRAWN AEROBIC ONLY [place]  Final   Culture NO GROWTH 3 DAYS  Final   Report Status PENDING  Incomplete  Culture, blood (routine x 2)     Status: None (Preliminary result)   Collection Time: 01/03/15 12:20 AM  Result Value Ref Range Status   Specimen Description BLOOD RIGHT HAND  Final   Special Requests IN PEDIATRIC BOTTLE 2CC  Final   Culture NO GROWTH 3 DAYS  Final   Report Status PENDING  Incomplete  Culture, blood (routine x 2)     Status: None (Preliminary result)   Collection Time: 01/05/15 12:04 AM  Result Value Ref Range Status   Specimen Description BLOOD RIGHT ANTECUBITAL  Final   Special Requests 1CC IN PEDIATRIC BOTTLE  Final   Culture PENDING  Incomplete   Report Status PENDING  Incomplete     Studies: Dg Chest Port 1 View  01/06/2015   CLINICAL DATA:  Fever and shortness of breath  EXAM: PORTABLE CHEST - 1 VIEW  COMPARISON:  January 04, 2015  FINDINGS: Endotracheal tube and nasogastric tube have been removed. No pneumothorax. There is left lower lobe consolidation. Lungs elsewhere clear. Heart is mildly enlarged with pulmonary vascularity within normal limits. No adenopathy. No bone lesions.  IMPRESSION: Left lower lobe airspace consolidation. Lungs elsewhere clear. No pneumothorax. Heart prominent but stable.    Electronically Signed   By: Lowella Grip III M.D.   On: 01/06/2015 01:27    Scheduled Meds: . albuterol  2.5 mg Nebulization QID  . amLODipine  10 mg  Per Tube Daily  . antiseptic oral rinse  7 mL Mouth Rinse QID  . aspirin  325 mg Per Tube Daily  . calcium gluconate 1 GM IV  1 g Intravenous Once  . chlorhexidine gluconate  15 mL Mouth Rinse BID  . clonazePAM  0.5 mg Oral QHS  . feeding supplement (NEPRO CARB STEADY)  237 mL Oral BID BM  . gabapentin  100 mg Oral Daily  . heparin subcutaneous  5,000 Units Subcutaneous 3 times per day  . insulin aspart  0-5 Units Subcutaneous 6 times per day  . insulin glargine  5 Units Subcutaneous QHS  . [START ON 01/07/2015] levofloxacin (LEVAQUIN) IV  500 mg Intravenous Q48H  . metoCLOPramide  10 mg Oral TID AC & HS  . metoprolol tartrate  25 mg Oral BID  . pantoprazole  40 mg Oral Daily  . simvastatin  20 mg Per Tube QHS  . sodium chloride  10-40 mL Intracatheter Q12H  . [START ON 01/07/2015] vancomycin  750 mg Intravenous Q M,W,F-HD   Continuous Infusions:   Principal Problem:   DKA (diabetic ketoacidoses) Active Problems:   GERD (gastroesophageal reflux disease)   RLS (restless legs syndrome)   Diabetic retinopathy   End-stage renal disease on hemodialysis   Hyperkalemia   HTN (hypertension)   Diastolic CHF, acute on chronic   Acute respiratory failure with hypoxia   Lethargy    Time spent: 30 minutes   Barton Dubois  [facility] Pager [phone]. If 7PM-7AM, please contact night-coverage at [email or web address], password [facility] 01/06/2015, 11:00 PM  LOS: 4 days

## 2015-01-06 NOTE — Progress Notes (Signed)
Pt has temp is 102.2, called MD on call with order to give Acetaminophen 650 supp, pt refused stated she had bad experience with suppositories. Blood culture, chest xray obtained, ABT IV started

## 2015-01-06 NOTE — Progress Notes (Signed)
Subjective:   Febrile overnight, feeling well this AM. No complaints  Objective Filed Vitals:   01/06/15 0856 01/06/15 0859 01/06/15 1100 01/06/15 1232  BP:      Pulse:      Temp:   99.3 F (37.4 C)   TempSrc:   Oral   Resp:      Height:      Weight:      SpO2: 71% 93%  94%   Physical Exam General: alert and oriented, no acute distress.  Heart: RRR  Lungs: CTA, unlabored Abdomen: soft, nontender +BS  Extremities: no edema  Dialysis Access:  R thigh AVG   MWF NW 3h 45 min 68kg 2/2.25 bath R thigh AVG no heparin Hect 3 / Venofer 50 / Mircera 100 q 2wks   Assessment: 1. DKA resolved- per primary.  2. Resp failure/ pulm edema - resolved 3. Fever- tmax 103 overnight. Blood cultures repeated. Cultures from 8/8 no growth. Vanc and levaquin 4. HTN/Volume is euvolemic,actually slightly under edw.  132/55 novasc and metop  5. AMS - due to meds, resolved- alert and oriented. 6. ESRD on HD- MWF NW, HD pending tomorrow 7. Hyperkalemia - resolved K+ 4 8. Hypophosphatemia - resolved phos 4.5  9. Anemia - hgb 10 watch CBC  Shelle Iron, NP [facility] Beeper [phone] 01/06/2015,12:43 PM  LOS: 4 days   Pt seen, examined and agree w A/P as above.  Kelly Splinter MD pager [phone]    cell [phone] 01/06/2015, 3:01 PM    Additional Objective Labs: Basic Metabolic Panel:  Recent Labs Lab 01/03/15 0010 01/03/15 0607 01/04/15 0510 01/04/15 1845 01/05/15 0425  NA 129* 138 138  --  141  K 4.0 3.0* 3.1*  --  4.0  CL 92* 99* 100*  --  100*  CO2 21* 28 27  --  29  GLUCOSE 568* 67 132*  --  142*  BUN 54* 9 8  --  17  CREATININE 9.54* 2.60* 3.34*  --  6.08*  CALCIUM 9.1 9.2 8.2*  --  7.7*  PHOS 2.2* <1.0*  --  4.5  --    Liver Function Tests:  Recent Labs Lab 01/02/15 1727 01/03/15 0010 01/03/15 0607  AST 93*  --   --   ALT 35  --   --   ALKPHOS 160*  --   --   BILITOT 1.7*  --   --   PROT 7.2  --   --   ALBUMIN 3.7 3.6 3.9   No results for  input(s): LIPASE, AMYLASE in the last 168 hours. CBC:  Recent Labs Lab 01/02/15 1727 01/04/15 0510 01/05/15 0425  WBC 6.8 6.8 5.7  HGB 11.9* 10.5* 10.0*  HCT 39.9 32.1* 32.1*  MCV 105.8* 93.9 100.3*  PLT 127* 103* 109*   Blood Culture    Component Value Date/Time   SDES BLOOD RIGHT ANTECUBITAL 01/05/2015 0004   SPECREQUEST 1CC IN PEDIATRIC BOTTLE 01/05/2015 0004   CULT PENDING 01/05/2015 0004   REPTSTATUS PENDING 01/05/2015 0004    Cardiac Enzymes:  Recent Labs Lab 01/02/15 1727  TROPONINI <0.03   CBG:  Recent Labs Lab 01/05/15 2200 01/06/15 0001 01/06/15 0447 01/06/15 0802 01/06/15 1214  GLUCAP 126* 100* 207* 326* 125*   Iron Studies: No results for input(s): IRON, TIBC, TRANSFERRIN, FERRITIN in the last 72 hours. @lablastinr3 @ Studies/Results: Dg Chest Port 1 View  01/06/2015   CLINICAL DATA:  Fever and shortness of breath  EXAM: PORTABLE CHEST - 1 VIEW  COMPARISON:  January 04, 2015  FINDINGS: Endotracheal tube and nasogastric tube have been removed. No pneumothorax. There is left lower lobe consolidation. Lungs elsewhere clear. Heart is mildly enlarged with pulmonary vascularity within normal limits. No adenopathy. No bone lesions.  IMPRESSION: Left lower lobe airspace consolidation. Lungs elsewhere clear. No pneumothorax. Heart prominent but stable.   Electronically Signed   By: Lowella Grip III M.D.   On: 01/06/2015 01:27   Medications:   . albuterol  2.5 mg Nebulization QID  . amLODipine  10 mg Per Tube Daily  . antiseptic oral rinse  7 mL Mouth Rinse QID  . aspirin  325 mg Per Tube Daily  . calcium gluconate 1 GM IV  1 g Intravenous Once  . chlorhexidine gluconate  15 mL Mouth Rinse BID  . clonazePAM  0.5 mg Oral QHS  . feeding supplement (NEPRO CARB STEADY)  237 mL Oral BID BM  . gabapentin  100 mg Oral Daily  . heparin subcutaneous  5,000 Units Subcutaneous 3 times per day  . insulin aspart  0-5 Units Subcutaneous 6 times per day  . insulin  glargine  5 Units Subcutaneous QHS  . [START ON 01/07/2015] levofloxacin (LEVAQUIN) IV  500 mg Intravenous Q48H  . metoCLOPramide  10 mg Oral TID AC & HS  . metoprolol tartrate  25 mg Oral BID  . pantoprazole  40 mg Oral Daily  . simvastatin  20 mg Per Tube QHS  . [START ON 01/07/2015] vancomycin  750 mg Intravenous Q M,W,F-HD

## 2015-01-06 NOTE — Progress Notes (Signed)
Pt desated to low 70s while ambulating on RA.

## 2015-01-06 NOTE — Progress Notes (Signed)
eLink Physician-Brief Progress Note Patient Name: [name] DOB: Jul 21, 1970 MRN: [id number]   Date of Service  01/06/2015  HPI/Events of Note  Fever 103F   Recent Labs Lab 01/02/15 1727 01/03/15 0010 01/03/15 0607  AST 93*  --   --   ALT 35  --   --   ALKPHOS 160*  --   --   BILITOT 1.7*  --   --   PROT 7.2  --   --   ALBUMIN 3.7 3.6 3.9   Refuses suppos tylenol Wants po tylenol per rN  eICU Interventions  Oral tylenol 500mg  po x 1     Intervention Category Minor Interventions: Other:  Lalani Winkles 01/06/2015, 5:50 AM

## 2015-01-06 NOTE — Care Management (Addendum)
Confirmed with patient that she is aware PT recommending SNF . Patient declines SNF and would like home health with [place] .   Will need home health orders for RN/PT/OT/SW/aide .   Thanks   Magdalen Spatz RN BSN [phone]

## 2015-01-06 NOTE — Progress Notes (Addendum)
Initial Nutrition Assessment  DOCUMENTATION CODES:   Not applicable  INTERVENTION:   Nepro Shake po BID, each supplement provides 425 kcal and 19 grams protein   NUTRITION DIAGNOSIS:   Inadequate oral intake related to lethargy/confusion as evidenced by meal completion < 50%.  Ongoing  GOAL:   Patient will meet greater than or equal to 90% of their needs  Progressing  MONITOR:   PO intake, Supplement acceptance, Labs, Weight trends, Skin, I & O's  REASON FOR ASSESSMENT:   Ventilator    ASSESSMENT:   44 yo Female presented to ER with dyspnea, altered mental status, hyperglycemia, hyperkalemia, pulmonary edema, possible PNA. She has hx of ESRD on HD. Intubated in ER.  Pt was extubated on 01/04/15. She was transferred from ICU to surgical floor on 01/05/15.   Per nephrology notes, pt down 2 kg from last HD on 01/04/15.   Pt performing ADLs with nurse tech at time of visit. Pt has been advanced to a renal, carb modified diet with 1.5 L fluid restriction. Intake has been poor, secondary to lethargy. Noted 25-50%. Breakfast tray on counter was unattempted at time of visit.   Reviewed PT note from 01/05/15; recommendation for SNF.  Labs reviewed: CBGS rising (100-326). Reviewed Diabetes Coordinator note on 01/04/15 and reviewed recommendations. Pt is very sensitive to insulin.   Diet Order:  Diet renal/carb modified with fluid restriction Diet-HS Snack?: Nothing; Room service appropriate?: Yes; Fluid consistency:: Thin; Fluid restriction:: 1500 mL Fluid  Skin:  Reviewed, no issues  Last BM:  PTA  Height:   Ht Readings from Last 1 Encounters:  01/03/15 5\' 1"  (1.549 m)    Weight:   Wt Readings from Last 1 Encounters:  01/05/15 148 lb 5.9 oz (67.3 kg)    Ideal Body Weight:  48 kg  BMI:  Body mass index is 28.05 kg/(m^2).  Estimated Nutritional Needs:   Kcal:  1800-2000  Protein:  80-95 grams  Fluid:  per MD  EDUCATION NEEDS:   No education needs  identified at this time  Norvella Loscalzo A. Jimmye Norman, RD, LDN, CDE Pager: [phone] After hours Pager: [phone]

## 2015-01-06 NOTE — Clinical Social Work Note (Addendum)
CSW met with patient to discuss SNF placement per recommendation by PT.  Patient stated she does not want to go to a SNF and would prefer home health.  Patient lives alone in her own apartment, patient stated she was using home health with [facility] before she was admitted to the hospital.  Patient stated she was receiving home health nursing, PT, and OT.  Patient stated she would like to know about other options for home health, but is willing to use [facility] again.  Patient states she uses Medicaid transportation to get to and from her appointments and dialyis.  CSW notified case manager for home health, CSW to sign off for now please reconsult if other social work needs arise.  Jones Broom. [place], MSW, [place] 01/06/2015 3:22 PM

## 2015-01-07 DIAGNOSIS — I5032 Chronic diastolic (congestive) heart failure: Secondary | ICD-10-CM | POA: Insufficient documentation

## 2015-01-07 DIAGNOSIS — J810 Acute pulmonary edema: Secondary | ICD-10-CM

## 2015-01-07 DIAGNOSIS — E0810 Diabetes mellitus due to underlying condition with ketoacidosis without coma: Secondary | ICD-10-CM

## 2015-01-07 LAB — CBC
HCT: 29.9 % — ABNORMAL LOW (ref 36.0–46.0)
HEMOGLOBIN: 9.3 g/dL — AB (ref 12.0–15.0)
MCH: 30.9 pg (ref 26.0–34.0)
MCHC: 31.1 g/dL (ref 30.0–36.0)
MCV: 99.3 fL (ref 78.0–100.0)
Platelets: 129 10*3/uL — ABNORMAL LOW (ref 150–400)
RBC: 3.01 MIL/uL — ABNORMAL LOW (ref 3.87–5.11)
RDW: 15.3 % (ref 11.5–15.5)
WBC: 4.3 10*3/uL (ref 4.0–10.5)

## 2015-01-07 LAB — GLUCOSE, CAPILLARY
GLUCOSE-CAPILLARY: 250 mg/dL — AB (ref 65–99)
GLUCOSE-CAPILLARY: 107 mg/dL — AB (ref 65–99)
GLUCOSE-CAPILLARY: 299 mg/dL — AB (ref 65–99)
Glucose-Capillary: 273 mg/dL — ABNORMAL HIGH (ref 65–99)
Glucose-Capillary: 226 mg/dL — ABNORMAL HIGH (ref 65–99)

## 2015-01-07 LAB — RENAL FUNCTION PANEL
ANION GAP: 10 (ref 5–15)
Albumin: 2.6 g/dL — ABNORMAL LOW (ref 3.5–5.0)
BUN: 21 mg/dL — ABNORMAL HIGH (ref 6–20)
CHLORIDE: 98 mmol/L — AB (ref 101–111)
CO2: 26 mmol/L (ref 22–32)
Calcium: 7 mg/dL — ABNORMAL LOW (ref 8.9–10.3)
Creatinine, Ser: 6.88 mg/dL — ABNORMAL HIGH (ref 0.44–1.00)
GFR calc non Af Amer: 7 mL/min — ABNORMAL LOW (ref >60)
GFR, EST AFRICAN AMERICAN: 8 mL/min — AB (ref >60)
GLUCOSE: 265 mg/dL — AB (ref 65–99)
Phosphorus: 4 mg/dL (ref 2.5–4.6)
Potassium: 3.9 mmol/L (ref 3.5–5.1)
SODIUM: 134 mmol/L — AB (ref 135–145)

## 2015-01-07 MED ORDER — SODIUM CHLORIDE 0.9 % IV SOLN
62.5000 mg | Freq: Once | INTRAVENOUS | Status: DC
  Filled 2015-01-07: qty 5

## 2015-01-07 MED ORDER — RENA-VITE PO TABS
1.0000 | ORAL_TABLET | Freq: Every day | ORAL | Status: DC
  Filled 2015-01-07: qty 1

## 2015-01-07 MED ORDER — LEVOFLOXACIN 500 MG PO TABS: 500.0000 mg | ORAL_TABLET | ORAL | Status: AC

## 2015-01-07 MED ORDER — GUAIFENESIN ER 600 MG PO TB12: 600.0000 mg | ORAL_TABLET | Freq: Two times a day (BID) | ORAL | Status: AC

## 2015-01-07 MED ORDER — DARBEPOETIN ALFA 100 MCG/0.5ML IJ SOSY
100.0000 ug | PREFILLED_SYRINGE | INTRAMUSCULAR | Status: DC
  Filled 2015-01-07: qty 0.5

## 2015-01-07 MED ORDER — METOPROLOL SUCCINATE ER 50 MG PO TB24: 50.0000 mg | ORAL_TABLET | Freq: Every day | ORAL | Status: AC

## 2015-01-07 NOTE — Progress Notes (Signed)
SATURATION QUALIFICATIONS: (This note is used to comply with regulatory documentation for home oxygen)  Patient Saturations on Room Air at Rest = 100%  Patient Saturations on Room Air while Ambulating = 81%  Patient Saturations on 2 Liters of oxygen while Ambulating = 94%  Patient Saturations on 1 Liters of oxygen while Ambulating = 86%  Please briefly explain why patient needs home oxygen: Patient requires 2L supplemental O2 in order to maintain SpO2 of >88% while ambulating   [place], [place]

## 2015-01-07 NOTE — Progress Notes (Signed)
O2 tank arrived for pt to take home. Instucted to use when ambulating set on 2L.. Verbally understood DC instructions, no questions asked.

## 2015-01-07 NOTE — Progress Notes (Signed)
Physical Therapy Treatment Patient Details Name: [name] MRN: [id number] DOB: 1971-04-30 Today's Date: 01/07/2015    History of Present Illness 44 yo female presented to ER with dyspnea, altered mental status, hyperglycemia, hyperkalemia, pulmonary edema, possible PNA. She has hx of ESRD on HD. Intubated in ER. Extubated 8/9    PT Comments    Remarkable improvement in functional mobility from previous therapy session. Tolerated dynamic gait challenges without significant changes in balance. Did not require an assistive device or physical assist for tasks assessed today. States she has arranged 24 hour supervision as needed. SpO2 noted to drop down to 81% while ambulating on room air. Improves to 94% and greater with 2L supplemental O2 applied. (see home O2 qualification note.)  Follow Up Recommendations  Supervision/Assistance - 24 hour;Home health PT (initially)     Equipment Recommendations  None recommended by PT    Recommendations for Other Services       Precautions / Restrictions Precautions Precautions: None Precaution Comments: monitor O2 Restrictions Weight Bearing Restrictions: No    Mobility  Bed Mobility Overal bed mobility: Modified Independent             General bed mobility comments: extra time, no assist needed  Transfers Overall transfer level: Needs assistance Equipment used: Rolling walker (2 wheeled);None Transfers: Sit to/from Stand Sit to Stand: Supervision         General transfer comment: Supervision for safety. Minimal sway noted. Performed with RW initially however attempted several more times from recliner without assistive device and performed safely without need for assistance.  Ambulation/Gait Ambulation/Gait assistance: Supervision Ambulation Distance (Feet): 525 Feet (x2) Assistive device: None Gait Pattern/deviations: Step-through pattern;Decreased stride length Gait velocity: decreased Gait velocity interpretation:  Below normal speed for age/gender General Gait Details: No notable drift noted today. Ambulating generally well. Minimal sway with dynamic gait challenges but able to self correct without physical assistance. VC for awareness. SpO2 dropped to 81% on room air. Trialed ambulation with 2L supplemental O2, maintained 94% and greater, and with 1L dropping to 86%.    Stairs            Wheelchair Mobility    Modified Rankin (Stroke Patients Only) Modified Rankin (Stroke Patients Only) Pre-Morbid Rankin Score: No significant disability Modified Rankin: Moderately severe disability     Balance Overall balance assessment: Needs assistance Sitting-balance support: No upper extremity supported Sitting balance-Leahy Scale: Normal     Standing balance support: No upper extremity supported Standing balance-Leahy Scale: Good                      Cognition Arousal/Alertness: Awake/alert Behavior During Therapy: WFL for tasks assessed/performed Overall Cognitive Status: Within Functional Limits for tasks assessed                      Exercises      General Comments General comments (skin integrity, edema, etc.): Greatly improved functionally from previous therapy session. Now reports boyfriend works 3rd shift, and best friend can stay with pt while he is away. Notable find with SpO2, see progress note for supplemental O2 qualification. No pronator drift noted on Lt today. We also discussed home management, safety with mobility, self care, and signs/symptoms of hypoxia. Pt with no further questions      Pertinent Vitals/Pain Pain Assessment: No/denies pain Pain Intervention(s): Monitored during session    Home Living  Prior Function            PT Goals (current goals can now be found in the care plan section) Acute Rehab PT Goals Patient Stated Goal: none stated PT Goal Formulation: With patient Time For Goal Achievement:  01/19/15 Potential to Achieve Goals: Good Progress towards PT goals: Progressing toward goals    Frequency  Min 3X/week    PT Plan Discharge plan needs to be updated;Equipment recommendations need to be updated    Co-evaluation             End of Session Equipment Utilized During Treatment: Oxygen Activity Tolerance: Patient tolerated treatment well Patient left: in chair;with call bell/phone within reach     Time: 0488-8916 PT Time Calculation (min) (ACUTE ONLY): 32 min  Charges:  $Gait Training: 8-22 mins $Self Care/Home Management: 8-22                    G Codes:      Ellouise Newer 02-02-2015, 5:21 PM Camille Bal [place], [place]

## 2015-01-07 NOTE — Progress Notes (Signed)
[facility] Progress Note  Assessment/Plan: 1. DKA resolved- per primary.  2. Resp failure/ pulm edema - resolved 3. Fever- tmax 99.5 overnight. Blood cultures repeated, results pending. Cultures from 8/8 no growth. continue Vanc and levaquin 4. HTN/Volume is euvolemic,actually slightly under edw. 132/55 novasc and metop  5. AMS - due to meds, resolved- alert and oriented. 6. ESRD on HD- MWF NW, HD for today.  7. Hyperkalemia - resolved K+ 3.9 Change to 3.0 K bath 8. Hypophosphatemia - resolved phos 4.5  9. Anemia - Hgb 9.3 Start ESA/restart weekly venofer. 10. Nutrition: Renal diet/Renal Vit 11. DM: per primary.   Rita H. Brown NP-C 01/07/2015, 8:42 AM  [facility] [phone]  Pt seen, examined and agree w A/P as above.  Kelly Splinter MD pager [phone]    cell [phone] 01/07/2015, 2:13 PM    Subjective:  "I feel OK". Alert, Oriented X 2. Dry non-productive cough. Last BS 107.   Objective Filed Vitals:   01/06/15 1718 01/06/15 1949 01/06/15 2221 01/07/15 0434  BP:  124/62  106/54  Pulse:  87  79  Temp:  98.9 F (37.2 C)  99.5 F (37.5 C)  TempSrc:  Oral  Oral  Resp:  13  17  Height:      Weight:      SpO2: 96% 92% 95% 95%   Physical Exam General: AA female, alert, cooperative Heart: S1,S2 RRR No M/G/R Lungs: Bilateral breath sounds slightly decreased R base. No WOB Abdomen:soft nontender active BS Extremities: No edema Dialysis Access: R thigh AVG + thrill + bruit. States she is having intervention next Friday per Dr. Doren Custard.   MWF NW 3h 45 min 68kg 2/2.25 bath R thigh AVG no heparin Hect 3 / Venofer 50 / Mircera 100 q 2wks Additional Objective Labs: Basic Metabolic Panel:  Recent Labs Lab 01/04/15 1845 01/05/15 0425 01/06/15 1500 01/07/15 0435  NA  --  141 138 134*  K  --  4.0 4.2 3.9  CL  --  100* 99* 98*  CO2  --  29 29 26   GLUCOSE  --  142* 176* 265*  BUN  --  17 13 21*  CREATININE  --  6.08* 5.68*  6.88*  CALCIUM  --  7.7* 7.4* 7.0*  PHOS 4.5  --  3.8 4.0   Liver Function Tests:  Recent Labs Lab 01/02/15 1727  01/03/15 0607 01/06/15 1500 01/07/15 0435  AST 93*  --   --   --   --   ALT 35  --   --   --   --   ALKPHOS 160*  --   --   --   --   BILITOT 1.7*  --   --   --   --   PROT 7.2  --   --   --   --   ALBUMIN 3.7  < > 3.9 3.0* 2.6*  < > = values in this interval not displayed. No results for input(s): LIPASE, AMYLASE in the last 168 hours. CBC:  Recent Labs Lab 01/02/15 1727 01/04/15 0510 01/05/15 0425 01/06/15 1507 01/07/15 0435  WBC 6.8 6.8 5.7 4.7 4.3  HGB 11.9* 10.5* 10.0* 9.6* 9.3*  HCT 39.9 32.1* 32.1* 31.3* 29.9*  MCV 105.8* 93.9 100.3* 100.0 99.3  PLT 127* 103* 109* 118* 129*   Blood Culture    Component Value Date/Time   SDES BLOOD RIGHT ANTECUBITAL 01/05/2015 0004   SPECREQUEST 1CC IN PEDIATRIC BOTTLE 01/05/2015 0004  CULT PENDING 01/05/2015 0004   REPTSTATUS PENDING 01/05/2015 0004    Cardiac Enzymes:  Recent Labs Lab 01/02/15 1727  TROPONINI <0.03   CBG:  Recent Labs Lab 01/06/15 1645 01/06/15 1946 01/07/15 0005 01/07/15 0433 01/07/15 0729  GLUCAP 219* 372* 273* 250* 107*   Iron Studies: No results for input(s): IRON, TIBC, TRANSFERRIN, FERRITIN in the last 72 hours. @lablastinr3 @ Studies/Results: Dg Chest Port 1 View  01/06/2015   CLINICAL DATA:  Fever and shortness of breath  EXAM: PORTABLE CHEST - 1 VIEW  COMPARISON:  January 04, 2015  FINDINGS: Endotracheal tube and nasogastric tube have been removed. No pneumothorax. There is left lower lobe consolidation. Lungs elsewhere clear. Heart is mildly enlarged with pulmonary vascularity within normal limits. No adenopathy. No bone lesions.  IMPRESSION: Left lower lobe airspace consolidation. Lungs elsewhere clear. No pneumothorax. Heart prominent but stable.   Electronically Signed   By: Lowella Grip III M.D.   On: 01/06/2015 01:27   Medications:   . albuterol  2.5 mg  Nebulization QID  . amLODipine  10 mg Per Tube Daily  . antiseptic oral rinse  7 mL Mouth Rinse QID  . aspirin  325 mg Per Tube Daily  . calcium gluconate 1 GM IV  1 g Intravenous Once  . chlorhexidine gluconate  15 mL Mouth Rinse BID  . clonazePAM  0.5 mg Oral QHS  . feeding supplement (NEPRO CARB STEADY)  237 mL Oral BID BM  . gabapentin  100 mg Oral Daily  . heparin subcutaneous  5,000 Units Subcutaneous 3 times per day  . insulin aspart  0-5 Units Subcutaneous 6 times per day  . insulin glargine  5 Units Subcutaneous QHS  . levofloxacin (LEVAQUIN) IV  500 mg Intravenous Q48H  . metoCLOPramide  10 mg Oral TID AC & HS  . metoprolol tartrate  25 mg Oral BID  . pantoprazole  40 mg Oral Daily  . simvastatin  20 mg Per Tube QHS  . sodium chloride  10-40 mL Intracatheter Q12H  . vancomycin  750 mg Intravenous Q M,W,F-HD

## 2015-01-07 NOTE — Discharge Summary (Signed)
Physician Discharge Summary  [name] QIW:[id number] DOB: 1971-03-04 DOA: 01/02/2015  PCP: [name], MD  Admit date: 01/02/2015 Discharge date: 01/07/2015  Time spent: >30 minutes  Recommendations for Outpatient Follow-up:  1. Repeat CXR in 3 weeks to follow resolution of infiltrates 2. Repeat CBC to follow Hgb trend 3. Follow her Diabetes closely and adjust hypoglycemic regimen as needed  Discharge Diagnoses:  Principal Problem:   DKA (diabetic ketoacidoses) Active Problems:   GERD (gastroesophageal reflux disease)   RLS (restless legs syndrome)   Diabetic retinopathy   End-stage renal disease on hemodialysis   Hyperkalemia   HTN (hypertension)   Diastolic CHF, acute on chronic   Acute respiratory failure with hypoxia   Lethargy   Discharge Condition: stable and improved. Discharge home with instruction to be compliant with her HD therapy and to follow with PCP in 10 days.  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 She presented to the ER with confusion, dyspnea. She was found to have hyperkalemia, hyperglycemia, pulmonary edema, possible pneumonia. She was difficulty IV access with multiple attempts by ER staff for CVL placement. She has her AV graft in her Rt groin. She required intubation in ER and was admitted to ICU.  Hospital Course:  1-Acute resp failure: with need of ventilatory support for 48 hours -improved and at baseline regarding chronic resp failure with oxygen  dependency  -no CP, non productive cough present -will discharge home on levaquin and with the use of mucinex and flutter valve/IS  2-chronic resp failure due to Asthma -continue dulera -resume outpatient singulair -continue O2 supplementation   3-chronic diastolic HF: acute on chronic on presentation, with positive pulmonary edema -volume adjusted with HD -extra fluid, pleural effusion and vascular congestion resolved with HD -advise to follow low sodium diet, to check her weight on daily basis and to be compliant with her HD  T-T-S   4-anemia of chronic disease: -Hgb stable overall -no signs of overt bleeding -epogen and IV iron as per renal discretion  5-fever and presumed HCAP -Treated with vanc and levaquin for approx 48 hours; once afebrile and with improved breathing, patient was discharge home on levaquin to complete therapy -will continue O2 supplementation and wean as tolerated -continue mucinex and IS/flutter valve -will follow final cx's  6-hx of diabetes type 1 uncontrolled -presented on DKA, resolved at time of discharge (close AG and normal range Bicarb) -will discharge on home hypoglycemic regimen  -advise to follow low carb diet -follow up with PCP for further adjustments as needed  7-anxiety:  -will resume home meds -stable mood st discharge   8-GERD/Gastroparesis -continue PPI -continue Reglan   9-Hyperkalemia/hyperfosfatemia: -controlled with HD -will continue Phoslo and Hectorol as an outpatient  Procedures:  Required intubation and ventilatory support since 01/02/15 to 01/04/15  Consultations:  PCCM  Renals service  Discharge Exam: Filed Vitals:   01/07/15 0434  BP: 106/54  Pulse: 79  Temp: 99.5 F (37.5 C)  Resp:  17    General: afebrile for the last 36 hours, denies CP and SOB. Patient is AAOX3 and cooperative with exam, she wants to go home.  Cardiovascular: S1 and S2, positive murmur, no rubs or gallops  Respiratory: no  wheezing, no crackles and good air movement. Patient with good O2 sat on her chronic 2L Oxygen supplementation  Abdomen: soft, NT, positive BS  Musculoskeletal: no edema or cyanosis   Discharge Instructions   Discharge Instructions    Diet - low sodium heart healthy    Complete by:  As directed      Discharge instructions    Complete by:  As directed   Take medications as prescribed Arrange follow up with PCP in 10 days Please follow low sodium and low carbohydrates diet Maintain adequate hydration  Use flutter valve/incentive spirometry as instructed Be compliant with your HD therapy          Current Discharge Medication List    START taking these medications   Details  guaiFENesin (MUCINEX) 600 MG 12 hr tablet Take 1 tablet (600 mg total) by mouth 2 (two) times daily. Qty: 30 tablet, Refills: 0    levofloxacin (LEVAQUIN) 500 MG tablet Take 1 tablet (500 mg total) by mouth every other day. Qty: 4 tablet, Refills: 0      CONTINUE these medications which have CHANGED   Details  metoprolol succinate (TOPROL-XL) 50 MG 24 hr tablet Take 1 tablet (50 mg total) by mouth daily. Take with or immediately following a meal. Qty: 30 tablet, Refills: 1      CONTINUE these medications which have NOT CHANGED   Details  albuterol (PROVENTIL HFA;VENTOLIN HFA) 108 (90 BASE) MCG/ACT inhaler Inhale 2 puffs into the lungs every 4 (four) hours as needed for wheezing or shortness of breath (as per home regimen). Qty: 1 Inhaler, Refills: 0    amLODipine (NORVASC) 10 MG tablet Take 10 mg by mouth daily. Refills: 11    aspirin 325 MG tablet Take 1 tablet (325 mg total) by mouth daily. Qty: 30 tablet, Refills: 0    calcium acetate (PHOSLO) 667 MG capsule Take 1 capsule (667 mg total) by mouth 2 (two) times daily. Qty: 30 capsule, Refills: 0    clonazePAM (KLONOPIN) 0.5 MG tablet Take 1 mg by mouth at bedtime.     Darbepoetin Alfa (ARANESP) 40 MCG/0.4ML SOSY injection Inject 0.4 mLs (40  mcg total) into the vein every Thursday with hemodialysis. Qty: 8.4 mL    gabapentin (NEURONTIN) 100 MG capsule Take 100 mg by mouth daily.     insulin glargine (LANTUS) 100 UNIT/ML injection Inject 0.07 mLs (7 Units total) into the skin 2 (two) times daily. Qty: 10 mL, Refills: 11    insulin lispro (HUMALOG KWIKPEN) 100 UNIT/ML KiwkPen Inject 5-6 Units into the skin 2 (two) times daily. Inject 6 units subcutaneously in the morning and 5 units in the evening    lidocaine (LIDODERM) 5 % Place 1 patch onto the skin daily. Remove & Discard patch within 12 hours or as directed by MD Qty: 10 patch, Refills: 0    metoCLOPramide (REGLAN) 10 MG tablet Take 1 tablet (10 mg total) by mouth 4 (four) times daily -  before meals and at bedtime. Qty: 60 tablet, Refills: 0    montelukast (SINGULAIR) 10 MG tablet Take 10 mg by mouth at bedtime.    multivitamin (RENA-VIT) TABS tablet Take 1 tablet by mouth daily. Qty: 30 tablet, Refills: 0    Nutritional Supplements (  FEEDING SUPPLEMENT, NEPRO CARB STEADY,) LIQD Take 237 mLs by mouth 2 (two) times daily between meals. Qty: 60 Can, Refills: 0    Oxycodone HCl 10 MG TABS Take 10 mg by mouth 3 (three) times daily as needed (for pain).  Refills: 0    pantoprazole (PROTONIX) 40 MG tablet Take 1 tablet (40 mg total) by mouth at bedtime. Qty: 30 tablet, Refills: 0    promethazine (PHENERGAN) 25 MG tablet Take 1 tablet (25 mg total) by mouth every 6 (six) hours as needed for nausea or vomiting. Qty: 10 tablet, Refills: 0    simvastatin (ZOCOR) 20 MG tablet Take 1 tablet (20 mg total) by mouth at bedtime. Qty: 30 tablet, Refills: 0    zolpidem (AMBIEN) 5 MG tablet Take 5 mg by mouth at bedtime as needed. sleep    doxercalciferol (HECTOROL) 4 MCG/2ML injection Inject 1 mL (2 mcg total) into the vein Every Tuesday,Thursday,and Saturday with dialysis. Qty: 2 mL      STOP taking these medications     insulin aspart (NOVOLOG) 100 UNIT/ML injection         Allergies  Allergen Reactions  . Depakote [Divalproex Sodium] Other (See Comments)    Pt gets "delirious"  . Morphine And Related Nausea And Vomiting and Other (See Comments)    "makes me delirious"  . Penicillins Anaphylaxis    Tolerated Zosyn Dec 2014  . Tramadol Nausea And Vomiting  . Imitrex [Sumatriptan] Other (See Comments)    seizures  . Vicodin [Hydrocodone-Acetaminophen] Itching and Rash   Follow-up Information    Follow up with [name], Gifford Shave, MD. Schedule an appointment as soon as possible for a visit in 10 days.   Specialty:  Family Medicine   Contact information:   [address] [place] [address] [phone]       The results of significant diagnostics from this hospitalization (including imaging, microbiology, ancillary and laboratory) are listed below for reference.    Significant Diagnostic Studies: Dg Chest Port 1 View  01/06/2015   CLINICAL DATA:  Fever and shortness of breath  EXAM: PORTABLE CHEST - 1 VIEW  COMPARISON:  January 04, 2015  FINDINGS: Endotracheal tube and nasogastric tube have been removed. No pneumothorax. There is left lower lobe consolidation. Lungs elsewhere clear. Heart is mildly enlarged with pulmonary vascularity within normal limits. No adenopathy. No bone lesions.  IMPRESSION: Left lower lobe airspace consolidation. Lungs elsewhere clear. No pneumothorax. Heart prominent but stable.   Electronically Signed   By: Lowella Grip III M.D.   On: 01/06/2015 01:27   Dg Chest Port 1 View  01/04/2015   CLINICAL DATA:  Ventilator dependent respiratory failure. Followup basilar atelectasis  EXAM: PORTABLE CHEST - 1 VIEW  COMPARISON:  01/03/2015 and earlier.  FINDINGS: Endotracheal tube tip in satisfactory position projecting approximately 4 cm above the carina. Nasogastric tube tip projects over the fundus of the stomach. Cardiomediastinal silhouette unremarkable and unchanged. Atelectasis in the lung bases, left  greater than right, with improved aeration since yesterday. No new pulmonary parenchymal abnormalities.  IMPRESSION: 1. Support apparatus satisfactory. 2. Mild bibasilar atelectasis, left greater than right, with improved aeration since yesterday. 3. No new abnormalities.   Electronically Signed   By: Evangeline Dakin M.D.   On: 01/04/2015 07:59   Dg Chest Port 1 View  01/03/2015   CLINICAL DATA:  Follow-up respiratory failure.  EXAM: PORTABLE CHEST - 1 VIEW  COMPARISON:  01/02/2015.  FINDINGS: Stable support apparatus, with  ET tube 4 cm above carina. Cardiomegaly. BILATERAL effusions with mild to moderate vascular congestion, slightly improved.  IMPRESSION: Improving pulmonary edema. Cardiomegaly persists. Stable support tubes and apparatus. No pneumothorax.   Electronically Signed   By: Staci Righter M.D.   On: 01/03/2015 07:28   Dg Chest Portable 1 View  01/02/2015   CLINICAL DATA:  Evaluate endotracheal tube placement.  EXAM: PORTABLE CHEST - 1 VIEW  COMPARISON:  Earlier same date.  FINDINGS: Monitoring leads overlie the patient. ET tube terminates within the mid trachea. Enteric tube courses inferior to the diaphragm. Stable enlarged cardiac mediastinal contours. Diffuse bilateral airspace opacities. Small layering bilateral pleural effusions. No pneumothorax.  IMPRESSION: ET tube terminates in the mid trachea. Enteric tube courses inferior to the diaphragm.  Cardiomegaly. Diffuse bilateral airspace opacities favored to represent edema. Pneumonia could have a similar appearance.   Electronically Signed   By: Lovey Newcomer M.D.   On: 01/02/2015 22:22   Dg Chest Portable 1 View  01/02/2015   CLINICAL DATA:  Short of breath. History of hypertension, diabetes, asthma and emphysema.  EXAM: PORTABLE CHEST - 1 VIEW  COMPARISON:  12/02/2014  FINDINGS: Since prior study, hazy opacities developed in the lower lungs. This obscures hemidiaphragms.  Cardiac silhouette is mildly enlarged. No mediastinal or masses or  adenopathy are noted.  No pneumothorax.  Skeletal structures are unremarkable.  IMPRESSION: 1. There is lower lung zone airspace opacity, new since the prior chest radiograph. This may reflect bilateral pneumonia or pulmonary edema. There are likely associated pleural effusions.   Electronically Signed   By: Lajean Manes M.D.   On: 01/02/2015 17:39    Microbiology: Recent Results (from the past 240 hour(s))  MRSA PCR Screening     Status: None   Collection Time: 01/03/15 12:14 AM  Result Value Ref Range Status   MRSA by PCR NEGATIVE NEGATIVE Final    Comment:        The GeneXpert MRSA Assay (FDA approved for NASAL specimens only), is one component of a comprehensive MRSA colonization surveillance program. It is not intended to diagnose MRSA infection nor to guide or monitor treatment for MRSA infections.   Culture, blood (routine x 2)     Status: None (Preliminary result)   Collection Time: 01/03/15 12:18 AM  Result Value Ref Range Status   Specimen Description BLOOD CENTRAL LINE  Final   Special Requests BOTTLES DRAWN AEROBIC ONLY [place]  Final   Culture NO GROWTH 3 DAYS  Final   Report Status PENDING  Incomplete  Culture, blood (routine x 2)     Status: None (Preliminary result)   Collection Time: 01/03/15 12:20 AM  Result Value Ref Range Status   Specimen Description BLOOD RIGHT HAND  Final   Special Requests IN PEDIATRIC BOTTLE 2CC  Final   Culture NO GROWTH 3 DAYS  Final   Report Status PENDING  Incomplete  Culture, blood (routine x 2)     Status: None (Preliminary result)   Collection Time: 01/05/15 12:04 AM  Result Value Ref Range Status   Specimen Description BLOOD RIGHT ANTECUBITAL  Final   Special Requests 1CC IN PEDIATRIC BOTTLE  Final   Culture PENDING  Incomplete   Report Status PENDING  Incomplete     Labs: Basic Metabolic Panel:  Recent Labs Lab 01/02/15 1750 01/03/15 0005 01/03/15 0010 01/03/15 0607 01/04/15 0510 01/04/15 1845 01/05/15 0425  01/06/15 1500 01/07/15 0435  NA  --   --  129* 138 138  --  141 138 134*  K  --   --  4.0 3.0* 3.1*  --  4.0 4.2 3.9  CL  --   --  92* 99* 100*  --  100* 99* 98*  CO2  --   --  21* 28 27  --  29 29 26   GLUCOSE  --   --  568* 67 132*  --  142* 176* 265*  BUN  --   --  54* 9 8  --  17 13 21*  CREATININE  --   --  9.54* 2.60* 3.34*  --  6.08* 5.68* 6.88*  CALCIUM  --   --  9.1 9.2 8.2*  --  7.7* 7.4* 7.0*  MG 2.2 2.2  --   --   --   --   --   --   --   PHOS  --   --  2.2* <1.0*  --  4.5  --  3.8 4.0   Liver Function Tests:  Recent Labs Lab 01/02/15 1727 01/03/15 0010 01/03/15 0607 01/06/15 1500 01/07/15 0435  AST 93*  --   --   --   --   ALT 35  --   --   --   --   ALKPHOS 160*  --   --   --   --   BILITOT 1.7*  --   --   --   --   PROT 7.2  --   --   --   --   ALBUMIN 3.7 3.6 3.9 3.0* 2.6*   CBC:  Recent Labs Lab 01/02/15 1727 01/04/15 0510 01/05/15 0425 01/06/15 1507 01/07/15 0435  WBC 6.8 6.8 5.7 4.7 4.3  HGB 11.9* 10.5* 10.0* 9.6* 9.3*  HCT 39.9 32.1* 32.1* 31.3* 29.9*  MCV 105.8* 93.9 100.3* 100.0 99.3  PLT 127* 103* 109* 118* 129*   Cardiac Enzymes:  Recent Labs Lab 01/02/15 1727  TROPONINI <0.03   BNP: BNP (last 3 results)  Recent Labs  01/02/15 1727  BNP 2030.1*    ProBNP (last 3 results)  Recent Labs  01/14/14 0216  PROBNP 17541.0*    CBG:  Recent Labs Lab 01/06/15 1946 01/07/15 0005 01/07/15 0433 01/07/15 0729 01/07/15 1233  GLUCAP 372* 273* 250* 107* 299*    Signed:  Barton Dubois  [facility] 01/07/2015, 12:48 PM

## 2015-01-07 NOTE — Care Management Note (Signed)
Case Management Note  Patient Details  Name: [name] MRN: [id number] Date of Birth: 1970/10/21  Subjective/Objective:                    Action/Plan:  Home health PT/OT/RN/aide/SW Expected Discharge Date:                  Expected Discharge Plan:  Home/Self Care  In-House Referral:     Discharge planning Services  CM Consult  Post Acute Care Choice:  Home Health Choice offered to:  Patient  DME Arranged:    DME Agency:  [place] Arranged:  RN, PT, OT, Nurse's Aide [facility] Agency:     Status of Service:  In process, will continue to follow  Medicare Important Message Given:  Yes-second notification given Date Medicare IM Given:    Medicare IM give by:    Date Additional Medicare IM Given:    Additional Medicare Important Message give by:     If discussed at [place] of Stay Meetings, dates discussed:    Additional Comments:  Marilu Favre, RN 01/07/2015, 8:06 AM

## 2015-01-08 LAB — CULTURE, BLOOD (ROUTINE X 2)
CULTURE: NO GROWTH
Culture: NO GROWTH

## 2015-01-11 LAB — CULTURE, BLOOD (ROUTINE X 2): Culture: NO GROWTH

## 2015-01-12 ENCOUNTER — Encounter (HOSPITAL_COMMUNITY): Payer: Self-pay | Admitting: *Deleted

## 2015-01-12 NOTE — Progress Notes (Signed)
Pt recently discharged from hospital on 01/07/15 after being intubated for respiratory distress. Pt states she is feeling better, not having sob or chest pain. Will have anesthesiology PA review record.

## 2015-01-12 NOTE — Progress Notes (Signed)
Anesthesia Chart Review:  Pt is 44 year old female scheduled for insertion of AV gore-tex graft L thigh on 01/14/2015 with Dr. Scot Dock.   Pt is same day work up.   PMH includes: diastolic CHF, stroke (73/5329), HTN, DM type 1, hyperlipidemia, ESRD on HD, anemia, emphysema, asthma, secondary hyperparathyroidism, S/p L and R carpal tunnel release (10/16/13, 03/02/13). S/p lymphocele excision 01/02/13. S/p multiple AV graft insertions, dialysis catheter insertions and AV fistula ligations.   Hospitalized 9/2-4/26/8341 for DKA complicated by presumed HCAP, acute on chronic diastolic CHF/pulmonary edema, acute respiratory failure with hypoxia; pt required ventilatory support for 48 hours. Glucose was 1225 in the ER 8/7.   Hospitalized 7/6-7/03/2015 for DKA. Glucose was 1000.   Hospitalized 6/15-6/21/2016 for DKA. Glucose was 1659.   Hospitalized 3/8-3/02/2015 for DKA. Glucose was 1211.  Hospitalized 96/22-29/79/8921 for DKA complicated by pulmonary edema due to missing dialysis. Glucose 1079.   Hospitalized 11/17-11/27/2015 for acute respiratory failure with hypoxia due to volume overload (missed dialysis), CVA (L pons infarct), DKA. Glucose was 791.   Medications include: albuterol, amlodipine, ASA, aranesp, dxercalciferol, lantus, humalog, levaquin, metoprolol, protonix, zocor  I-stat 4 will be obtained DOS.   EKG 01/02/2015: Sinus rhythm. Prolonged PR interval. Left atrial enlargement. Borderline right axis deviation, consider hyperkalemia  Echo 04/19/2014: - Left ventricle: The cavity size was normal. Wall thickness was ncreased in a pattern of mild LVH. The estimated ejection fraction was 60%. Wall motion was normal; there were no regional wall motion abnormalities. - Aortic valve: Sclerosis without stenosis. There was no significant regurgitation. Peak gradient (S): 22 mm Hg. - Right ventricle: The cavity size was normal. Systolic function was normal. - Pericardium, extracardiac: A trivial  pericardial effusion was identified posterior to the heart. - Impressions: No cardiac source of embolism was identified, but cannot be ruled out on the basis of this examination.  Carotid doppler US 04/19/2014: - The left vertebral artery appears patent with antegrade flow. - Findings consistent with 1- 39 percent stenosis involving theright internal carotid artery and the left internal carotid artery. - Limited views of right carotid due to IJ line  If labs acceptable DOS, I anticipate pt can proceed as scheduled.   Willeen Cass, FNP-BC [facility]/Anesthesiology Phone: [phone] 01/12/2015 3:50 PM

## 2015-01-14 ENCOUNTER — Encounter (HOSPITAL_COMMUNITY)
Admission: RE | Disposition: A | Discharge status: Home / Self Care | Payer: Self-pay | Source: Ambulatory Visit | Attending: Vascular Surgery

## 2015-01-14 ENCOUNTER — Inpatient Hospital Stay (HOSPITAL_COMMUNITY)
Admission: RE | Admit: 2015-01-14 | Discharge: 2015-01-15 | DRG: 252 | Disposition: A | Discharge status: Home / Self Care | Payer: Medicare Other | Source: Ambulatory Visit | Attending: Vascular Surgery | Admitting: Vascular Surgery

## 2015-01-14 ENCOUNTER — Ambulatory Visit (HOSPITAL_COMMUNITY): Payer: Medicare Other | Admitting: Emergency Medicine

## 2015-01-14 DIAGNOSIS — N185 Chronic kidney disease, stage 5: Secondary | ICD-10-CM | POA: Diagnosis not present

## 2015-01-14 DIAGNOSIS — Z794 Long term (current) use of insulin: Secondary | ICD-10-CM | POA: Diagnosis not present

## 2015-01-14 DIAGNOSIS — K3184 Gastroparesis: Secondary | ICD-10-CM | POA: Diagnosis present

## 2015-01-14 DIAGNOSIS — I5032 Chronic diastolic (congestive) heart failure: Secondary | ICD-10-CM | POA: Diagnosis present

## 2015-01-14 DIAGNOSIS — K219 Gastro-esophageal reflux disease without esophagitis: Secondary | ICD-10-CM | POA: Diagnosis present

## 2015-01-14 DIAGNOSIS — Y832 Surgical operation with anastomosis, bypass or graft as the cause of abnormal reaction of the patient, or of later complication, without mention of misadventure at the time of the procedure: Secondary | ICD-10-CM | POA: Diagnosis present

## 2015-01-14 DIAGNOSIS — N2581 Secondary hyperparathyroidism of renal origin: Secondary | ICD-10-CM | POA: Diagnosis present

## 2015-01-14 DIAGNOSIS — D649 Anemia, unspecified: Secondary | ICD-10-CM | POA: Diagnosis present

## 2015-01-14 DIAGNOSIS — T82838A Hemorrhage of vascular prosthetic devices, implants and grafts, initial encounter: Principal | ICD-10-CM | POA: Diagnosis present

## 2015-01-14 DIAGNOSIS — E669 Obesity, unspecified: Secondary | ICD-10-CM | POA: Diagnosis present

## 2015-01-14 DIAGNOSIS — J45909 Unspecified asthma, uncomplicated: Secondary | ICD-10-CM | POA: Diagnosis present

## 2015-01-14 DIAGNOSIS — Z7982 Long term (current) use of aspirin: Secondary | ICD-10-CM | POA: Diagnosis not present

## 2015-01-14 DIAGNOSIS — E10319 Type 1 diabetes mellitus with unspecified diabetic retinopathy without macular edema: Secondary | ICD-10-CM | POA: Diagnosis present

## 2015-01-14 DIAGNOSIS — M898X9 Other specified disorders of bone, unspecified site: Secondary | ICD-10-CM | POA: Diagnosis present

## 2015-01-14 DIAGNOSIS — Z79899 Other long term (current) drug therapy: Secondary | ICD-10-CM

## 2015-01-14 DIAGNOSIS — Z992 Dependence on renal dialysis: Secondary | ICD-10-CM | POA: Diagnosis not present

## 2015-01-14 DIAGNOSIS — Z8673 Personal history of transient ischemic attack (TIA), and cerebral infarction without residual deficits: Secondary | ICD-10-CM

## 2015-01-14 DIAGNOSIS — E1022 Type 1 diabetes mellitus with diabetic chronic kidney disease: Secondary | ICD-10-CM | POA: Diagnosis present

## 2015-01-14 DIAGNOSIS — Z9483 Pancreas transplant status: Secondary | ICD-10-CM

## 2015-01-14 DIAGNOSIS — J439 Emphysema, unspecified: Secondary | ICD-10-CM | POA: Diagnosis present

## 2015-01-14 DIAGNOSIS — N186 End stage renal disease: Secondary | ICD-10-CM | POA: Diagnosis present

## 2015-01-14 DIAGNOSIS — E785 Hyperlipidemia, unspecified: Secondary | ICD-10-CM | POA: Diagnosis present

## 2015-01-14 DIAGNOSIS — E1043 Type 1 diabetes mellitus with diabetic autonomic (poly)neuropathy: Secondary | ICD-10-CM | POA: Diagnosis present

## 2015-01-14 DIAGNOSIS — I120 Hypertensive chronic kidney disease with stage 5 chronic kidney disease or end stage renal disease: Secondary | ICD-10-CM | POA: Diagnosis present

## 2015-01-14 HISTORY — PX: AV FISTULA PLACEMENT: SHX1204

## 2015-01-14 HISTORY — DX: Cerebral infarction, unspecified: I63.9

## 2015-01-14 LAB — RENAL FUNCTION PANEL
ANION GAP: 13 (ref 5–15)
Albumin: 3.2 g/dL — ABNORMAL LOW (ref 3.5–5.0)
BUN: 22 mg/dL — ABNORMAL HIGH (ref 6–20)
CALCIUM: 8.3 mg/dL — AB (ref 8.9–10.3)
CHLORIDE: 95 mmol/L — AB (ref 101–111)
CO2: 23 mmol/L (ref 22–32)
CREATININE: 6.51 mg/dL — AB (ref 0.44–1.00)
GFR calc non Af Amer: 7 mL/min — ABNORMAL LOW (ref >60)
GFR, EST AFRICAN AMERICAN: 8 mL/min — AB (ref >60)
GLUCOSE: 369 mg/dL — AB (ref 65–99)
Phosphorus: 5 mg/dL — ABNORMAL HIGH (ref 2.5–4.6)
Potassium: 5.3 mmol/L — ABNORMAL HIGH (ref 3.5–5.1)
SODIUM: 131 mmol/L — AB (ref 135–145)

## 2015-01-14 LAB — CBC
HCT: 35.4 % — ABNORMAL LOW (ref 36.0–46.0)
HEMOGLOBIN: 11.2 g/dL — AB (ref 12.0–15.0)
MCH: 31.5 pg (ref 26.0–34.0)
MCHC: 31.6 g/dL (ref 30.0–36.0)
MCV: 99.7 fL (ref 78.0–100.0)
Platelets: 208 10*3/uL (ref 150–400)
RBC: 3.55 MIL/uL — AB (ref 3.87–5.11)
RDW: 15.6 % — ABNORMAL HIGH (ref 11.5–15.5)
WBC: 8.2 10*3/uL (ref 4.0–10.5)

## 2015-01-14 LAB — POCT I-STAT 4, (NA,K, GLUC, HGB,HCT)
Glucose, Bld: 93 mg/dL (ref 65–99)
HCT: 38 % (ref 36.0–46.0)
HEMOGLOBIN: 12.9 g/dL (ref 12.0–15.0)
Potassium: 4.3 mmol/L (ref 3.5–5.1)
Sodium: 134 mmol/L — ABNORMAL LOW (ref 135–145)

## 2015-01-14 LAB — HCG, SERUM, QUALITATIVE: PREG SERUM: NEGATIVE

## 2015-01-14 LAB — GLUCOSE, CAPILLARY
GLUCOSE-CAPILLARY: 279 mg/dL — AB (ref 65–99)
Glucose-Capillary: 102 mg/dL — ABNORMAL HIGH (ref 65–99)

## 2015-01-14 SURGERY — INSERTION OF ARTERIOVENOUS (AV) GORE-TEX GRAFT THIGH
Anesthesia: General | Site: Thigh | Laterality: Left

## 2015-01-14 MED ORDER — SODIUM CHLORIDE 0.9 % IV SOLN: 100.0000 mL | INTRAVENOUS | Status: DC | PRN

## 2015-01-14 MED ORDER — INSULIN GLARGINE 100 UNIT/ML ~~LOC~~ SOLN
7.0000 [IU] | Freq: Two times a day (BID) | SUBCUTANEOUS | Status: DC
  Administered 2015-01-14 – 2015-01-15 (×2): 7 [IU] via SUBCUTANEOUS
  Filled 2015-01-14 (×3): qty 0.07

## 2015-01-14 MED ORDER — PROMETHAZINE HCL 25 MG/ML IJ SOLN
INTRAMUSCULAR | Status: AC
Start: 2015-01-14 — End: 2015-01-15
  Filled 2015-01-14: qty 1

## 2015-01-14 MED ORDER — PANTOPRAZOLE SODIUM 40 MG PO TBEC
40.0000 mg | DELAYED_RELEASE_TABLET | Freq: Every day | ORAL | Status: DC
  Administered 2015-01-14 – 2015-01-15 (×2): 40 mg via ORAL
  Filled 2015-01-14 (×2): qty 1

## 2015-01-14 MED ORDER — PROMETHAZINE HCL 25 MG PO TABS
25.0000 mg | ORAL_TABLET | Freq: Four times a day (QID) | ORAL | Status: DC | PRN
  Administered 2015-01-14: 25 mg via ORAL
  Filled 2015-01-14: qty 1

## 2015-01-14 MED ORDER — FENTANYL CITRATE (PF) 250 MCG/5ML IJ SOLN
INTRAMUSCULAR | Status: AC
  Filled 2015-01-14: qty 5

## 2015-01-14 MED ORDER — GLYCOPYRROLATE 0.2 MG/ML IJ SOLN
INTRAMUSCULAR | Status: DC | PRN
  Administered 2015-01-14: 0.4 mg via INTRAVENOUS

## 2015-01-14 MED ORDER — LIDOCAINE HCL (PF) 1 % IJ SOLN: 5.0000 mL | INTRAMUSCULAR | Status: DC | PRN

## 2015-01-14 MED ORDER — THROMBIN 20000 UNITS EX SOLR
CUTANEOUS | Status: AC
  Filled 2015-01-14: qty 20000

## 2015-01-14 MED ORDER — VANCOMYCIN HCL IN DEXTROSE 1-5 GM/200ML-% IV SOLN: 1000.0000 mg | Freq: Two times a day (BID) | INTRAVENOUS | Status: DC

## 2015-01-14 MED ORDER — PROTAMINE SULFATE 10 MG/ML IV SOLN
INTRAVENOUS | Status: DC | PRN
  Administered 2015-01-14: 40 mg via INTRAVENOUS

## 2015-01-14 MED ORDER — HYDROMORPHONE HCL 1 MG/ML IJ SOLN
INTRAMUSCULAR | Status: AC
  Filled 2015-01-14: qty 1

## 2015-01-14 MED ORDER — VANCOMYCIN HCL IN DEXTROSE 1-5 GM/200ML-% IV SOLN
1000.0000 mg | INTRAVENOUS | Status: AC
  Administered 2015-01-14: 1000 mg via INTRAVENOUS
  Filled 2015-01-14: qty 200

## 2015-01-14 MED ORDER — SODIUM CHLORIDE 0.9 % IV SOLN
INTRAVENOUS | Status: DC
  Administered 2015-01-14: 10:00:00 via INTRAVENOUS

## 2015-01-14 MED ORDER — PHENOL 1.4 % MT LIQD: 1.0000 | OROMUCOSAL | Status: DC | PRN

## 2015-01-14 MED ORDER — MEPERIDINE HCL 25 MG/ML IJ SOLN: 6.2500 mg | INTRAMUSCULAR | Status: DC | PRN

## 2015-01-14 MED ORDER — ONDANSETRON HCL 4 MG/2ML IJ SOLN
INTRAMUSCULAR | Status: DC | PRN
  Administered 2015-01-14: 4 mg via INTRAVENOUS

## 2015-01-14 MED ORDER — ACETAMINOPHEN 325 MG PO TABS: 325.0000 mg | ORAL_TABLET | ORAL | Status: DC | PRN

## 2015-01-14 MED ORDER — SODIUM CHLORIDE 0.9 % IJ SOLN: 3.0000 mL | INTRAMUSCULAR | Status: DC | PRN

## 2015-01-14 MED ORDER — ALTEPLASE 2 MG IJ SOLR
2.0000 mg | Freq: Once | INTRAMUSCULAR | Status: DC | PRN
  Filled 2015-01-14: qty 2

## 2015-01-14 MED ORDER — AMLODIPINE BESYLATE 10 MG PO TABS
10.0000 mg | ORAL_TABLET | Freq: Every day | ORAL | Status: DC
  Filled 2015-01-14: qty 1

## 2015-01-14 MED ORDER — 0.9 % SODIUM CHLORIDE (POUR BTL) OPTIME
TOPICAL | Status: DC | PRN
  Administered 2015-01-14: 1000 mL

## 2015-01-14 MED ORDER — THROMBIN 20000 UNITS EX SOLR
CUTANEOUS | Status: DC | PRN
  Administered 2015-01-14: 13:00:00 via TOPICAL

## 2015-01-14 MED ORDER — SODIUM CHLORIDE 0.9 % IV SOLN
10.0000 mg | INTRAVENOUS | Status: DC | PRN
  Administered 2015-01-14: 25 ug/min via INTRAVENOUS

## 2015-01-14 MED ORDER — INSULIN ASPART 100 UNIT/ML ~~LOC~~ SOLN
0.0000 [IU] | Freq: Every day | SUBCUTANEOUS | Status: DC
  Administered 2015-01-14: 5 [IU] via SUBCUTANEOUS

## 2015-01-14 MED ORDER — OXYCODONE HCL 5 MG PO TABS
ORAL_TABLET | ORAL | Status: AC
  Filled 2015-01-14: qty 1

## 2015-01-14 MED ORDER — SODIUM CHLORIDE 0.9 % IV SOLN
INTRAVENOUS | Status: DC
  Administered 2015-01-14: 09:00:00 via INTRAVENOUS

## 2015-01-14 MED ORDER — ZOLPIDEM TARTRATE 5 MG PO TABS
5.0000 mg | ORAL_TABLET | Freq: Every evening | ORAL | Status: DC | PRN
  Administered 2015-01-14: 5 mg via ORAL
  Filled 2015-01-14: qty 1

## 2015-01-14 MED ORDER — HYDRALAZINE HCL 20 MG/ML IJ SOLN: 5.0000 mg | INTRAMUSCULAR | Status: DC | PRN

## 2015-01-14 MED ORDER — HEPARIN SODIUM (PORCINE) 1000 UNIT/ML IJ SOLN
INTRAMUSCULAR | Status: DC | PRN
  Administered 2015-01-14: 6000 [IU] via INTRAVENOUS

## 2015-01-14 MED ORDER — OXYCODONE HCL 5 MG PO TABS
10.0000 mg | ORAL_TABLET | Freq: Three times a day (TID) | ORAL | Status: DC | PRN
  Administered 2015-01-14 – 2015-01-15 (×2): 10 mg via ORAL
  Filled 2015-01-14: qty 2

## 2015-01-14 MED ORDER — GUAIFENESIN ER 600 MG PO TB12
600.0000 mg | ORAL_TABLET | Freq: Two times a day (BID) | ORAL | Status: DC
  Administered 2015-01-14 – 2015-01-15 (×2): 600 mg via ORAL
  Filled 2015-01-14 (×2): qty 1

## 2015-01-14 MED ORDER — INSULIN ASPART 100 UNIT/ML ~~LOC~~ SOLN: 5.0000 [IU] | Freq: Every day | SUBCUTANEOUS | Status: DC

## 2015-01-14 MED ORDER — SODIUM CHLORIDE 0.9 % IR SOLN
Status: DC | PRN
  Administered 2015-01-14: 500 mL

## 2015-01-14 MED ORDER — SIMVASTATIN 20 MG PO TABS
20.0000 mg | ORAL_TABLET | Freq: Every day | ORAL | Status: DC
  Administered 2015-01-14: 20 mg via ORAL
  Filled 2015-01-14: qty 1

## 2015-01-14 MED ORDER — GABAPENTIN 100 MG PO CAPS
100.0000 mg | ORAL_CAPSULE | Freq: Every day | ORAL | Status: DC
  Administered 2015-01-14 – 2015-01-15 (×2): 100 mg via ORAL
  Filled 2015-01-14 (×2): qty 1

## 2015-01-14 MED ORDER — LIDOCAINE 5 % EX PTCH
1.0000 | MEDICATED_PATCH | CUTANEOUS | Status: DC
  Administered 2015-01-14: 1 via TRANSDERMAL
  Filled 2015-01-14: qty 1

## 2015-01-14 MED ORDER — CLONAZEPAM 1 MG PO TABS
1.0000 mg | ORAL_TABLET | Freq: Every day | ORAL | Status: DC
  Administered 2015-01-14: 1 mg via ORAL
  Filled 2015-01-14: qty 1

## 2015-01-14 MED ORDER — INSULIN ASPART 100 UNIT/ML ~~LOC~~ SOLN
0.0000 [IU] | Freq: Three times a day (TID) | SUBCUTANEOUS | Status: DC
  Administered 2015-01-15: 2 [IU] via SUBCUTANEOUS

## 2015-01-14 MED ORDER — PROPOFOL 10 MG/ML IV BOLUS
INTRAVENOUS | Status: DC | PRN
  Administered 2015-01-14: 150 mg via INTRAVENOUS

## 2015-01-14 MED ORDER — LIDOCAINE HCL (CARDIAC) 20 MG/ML IV SOLN
INTRAVENOUS | Status: DC | PRN
  Administered 2015-01-14: 100 mg via INTRAVENOUS

## 2015-01-14 MED ORDER — GUAIFENESIN-DM 100-10 MG/5ML PO SYRP: 15.0000 mL | ORAL_SOLUTION | ORAL | Status: DC | PRN

## 2015-01-14 MED ORDER — VANCOMYCIN HCL IN DEXTROSE 750-5 MG/150ML-% IV SOLN
750.0000 mg | INTRAVENOUS | Status: DC
  Administered 2015-01-15: 750 mg via INTRAVENOUS
  Filled 2015-01-14 (×2): qty 150

## 2015-01-14 MED ORDER — MIDAZOLAM HCL 2 MG/2ML IJ SOLN
INTRAMUSCULAR | Status: AC
  Filled 2015-01-14: qty 4

## 2015-01-14 MED ORDER — METOPROLOL TARTRATE 1 MG/ML IV SOLN: 2.0000 mg | INTRAVENOUS | Status: DC | PRN

## 2015-01-14 MED ORDER — ALBUTEROL SULFATE HFA 108 (90 BASE) MCG/ACT IN AERS: 2.0000 | INHALATION_SPRAY | RESPIRATORY_TRACT | Status: DC | PRN

## 2015-01-14 MED ORDER — SODIUM CHLORIDE 0.9 % IV SOLN: 250.0000 mL | INTRAVENOUS | Status: DC | PRN

## 2015-01-14 MED ORDER — INSULIN LISPRO 100 UNIT/ML (KWIKPEN): 5.0000 [IU] | PEN_INJECTOR | Freq: Two times a day (BID) | SUBCUTANEOUS | Status: DC

## 2015-01-14 MED ORDER — OXYCODONE HCL 5 MG/5ML PO SOLN: 5.0000 mg | Freq: Once | ORAL | Status: AC | PRN

## 2015-01-14 MED ORDER — RENA-VITE PO TABS
1.0000 | ORAL_TABLET | Freq: Every day | ORAL | Status: DC
  Administered 2015-01-14: 1 via ORAL
  Filled 2015-01-14: qty 1

## 2015-01-14 MED ORDER — MIDAZOLAM HCL 5 MG/5ML IJ SOLN
INTRAMUSCULAR | Status: DC | PRN
  Administered 2015-01-14: 2 mg via INTRAVENOUS

## 2015-01-14 MED ORDER — PENTAFLUOROPROP-TETRAFLUOROETH EX AERO: 1.0000 "application " | INHALATION_SPRAY | CUTANEOUS | Status: DC | PRN

## 2015-01-14 MED ORDER — NEPRO/CARBSTEADY PO LIQD: 237.0000 mL | ORAL | Status: DC | PRN

## 2015-01-14 MED ORDER — HYDROMORPHONE HCL 1 MG/ML IJ SOLN
0.2500 mg | INTRAMUSCULAR | Status: DC | PRN
  Administered 2015-01-14: 0.5 mg via INTRAVENOUS
  Administered 2015-01-14 (×3): 0.25 mg via INTRAVENOUS
  Administered 2015-01-14: 0.5 mg via INTRAVENOUS

## 2015-01-14 MED ORDER — DEXTROSE 5 % IV SOLN
INTRAVENOUS | Status: DC | PRN
  Administered 2015-01-14: 11:00:00 via INTRAVENOUS

## 2015-01-14 MED ORDER — ACETAMINOPHEN 650 MG RE SUPP: 325.0000 mg | RECTAL | Status: DC | PRN

## 2015-01-14 MED ORDER — HEPARIN SODIUM (PORCINE) 1000 UNIT/ML DIALYSIS: 1000.0000 [IU] | INTRAMUSCULAR | Status: DC | PRN

## 2015-01-14 MED ORDER — LIDOCAINE-PRILOCAINE 2.5-2.5 % EX CREA
1.0000 "application " | TOPICAL_CREAM | CUTANEOUS | Status: DC | PRN
  Filled 2015-01-14: qty 5

## 2015-01-14 MED ORDER — OXYCODONE HCL 10 MG PO TABS: 10.0000 mg | ORAL_TABLET | Freq: Three times a day (TID) | ORAL | Status: DC | PRN

## 2015-01-14 MED ORDER — MONTELUKAST SODIUM 10 MG PO TABS
10.0000 mg | ORAL_TABLET | Freq: Every day | ORAL | Status: DC
  Administered 2015-01-14: 10 mg via ORAL
  Filled 2015-01-14: qty 1

## 2015-01-14 MED ORDER — INSULIN ASPART 100 UNIT/ML ~~LOC~~ SOLN: 6.0000 [IU] | Freq: Every day | SUBCUTANEOUS | Status: DC

## 2015-01-14 MED ORDER — BISACODYL 10 MG RE SUPP: 10.0000 mg | Freq: Every day | RECTAL | Status: DC | PRN

## 2015-01-14 MED ORDER — METOCLOPRAMIDE HCL 10 MG PO TABS
10.0000 mg | ORAL_TABLET | Freq: Three times a day (TID) | ORAL | Status: DC
  Administered 2015-01-14 – 2015-01-15 (×2): 10 mg via ORAL
  Filled 2015-01-14 (×2): qty 1

## 2015-01-14 MED ORDER — EPHEDRINE SULFATE 50 MG/ML IJ SOLN
INTRAMUSCULAR | Status: DC | PRN
  Administered 2015-01-14 (×2): 10 mg via INTRAVENOUS

## 2015-01-14 MED ORDER — ASPIRIN 325 MG PO TABS
325.0000 mg | ORAL_TABLET | Freq: Every day | ORAL | Status: DC
  Administered 2015-01-14 – 2015-01-15 (×2): 325 mg via ORAL
  Filled 2015-01-14 (×2): qty 1

## 2015-01-14 MED ORDER — CALCIUM ACETATE 667 MG PO CAPS
2001.0000 mg | ORAL_CAPSULE | Freq: Three times a day (TID) | ORAL | Status: DC
  Administered 2015-01-15: 2001 mg via ORAL
  Filled 2015-01-14 (×5): qty 3

## 2015-01-14 MED ORDER — SODIUM CHLORIDE 0.9 % IJ SOLN
3.0000 mL | Freq: Two times a day (BID) | INTRAMUSCULAR | Status: DC
  Administered 2015-01-14 – 2015-01-15 (×2): 3 mL via INTRAVENOUS

## 2015-01-14 MED ORDER — DARBEPOETIN ALFA 40 MCG/0.4ML IJ SOSY: 40.0000 ug | PREFILLED_SYRINGE | INTRAMUSCULAR | Status: DC

## 2015-01-14 MED ORDER — PROPOFOL 10 MG/ML IV BOLUS
INTRAVENOUS | Status: AC
  Filled 2015-01-14: qty 20

## 2015-01-14 MED ORDER — METOPROLOL SUCCINATE ER 50 MG PO TB24
50.0000 mg | ORAL_TABLET | Freq: Every day | ORAL | Status: DC
  Filled 2015-01-14: qty 1

## 2015-01-14 MED ORDER — ONDANSETRON HCL 4 MG/2ML IJ SOLN
4.0000 mg | Freq: Four times a day (QID) | INTRAMUSCULAR | Status: DC | PRN
  Filled 2015-01-14: qty 2

## 2015-01-14 MED ORDER — PROMETHAZINE HCL 25 MG/ML IJ SOLN
6.2500 mg | INTRAMUSCULAR | Status: AC | PRN
  Administered 2015-01-14 (×2): 6.25 mg via INTRAVENOUS

## 2015-01-14 MED ORDER — CHLORHEXIDINE GLUCONATE CLOTH 2 % EX PADS: 6.0000 | MEDICATED_PAD | Freq: Once | CUTANEOUS | Status: DC

## 2015-01-14 MED ORDER — NEPRO/CARBSTEADY PO LIQD: 237.0000 mL | Freq: Two times a day (BID) | ORAL | Status: DC

## 2015-01-14 MED ORDER — FENTANYL CITRATE (PF) 100 MCG/2ML IJ SOLN
INTRAMUSCULAR | Status: DC | PRN
  Administered 2015-01-14 (×5): 50 ug via INTRAVENOUS

## 2015-01-14 MED ORDER — OXYCODONE HCL 5 MG PO TABS
5.0000 mg | ORAL_TABLET | Freq: Once | ORAL | Status: AC | PRN
  Administered 2015-01-14: 5 mg via ORAL

## 2015-01-14 MED ORDER — LABETALOL HCL 5 MG/ML IV SOLN: 10.0000 mg | INTRAVENOUS | Status: DC | PRN

## 2015-01-14 SURGICAL SUPPLY — 38 items
CANISTER SUCTION 2500CC (MISCELLANEOUS) ×3 IMPLANT
CANNULA VESSEL 3MM 2 BLNT TIP (CANNULA) ×3 IMPLANT
CLIP TI MEDIUM 6 (CLIP) ×3 IMPLANT
CLIP TI WIDE RED SMALL 6 (CLIP) ×3 IMPLANT
DRAPE INCISE IOBAN 66X45 STRL (DRAPES) ×3 IMPLANT
DRAPE ORTHO SPLIT 77X108 STRL (DRAPES) ×2
DRAPE SURG ORHT 6 SPLT 77X108 (DRAPES) ×1 IMPLANT
ELECT REM PT RETURN 9FT ADLT (ELECTROSURGICAL) ×3
ELECTRODE REM PT RTRN 9FT ADLT (ELECTROSURGICAL) ×1 IMPLANT
GLOVE BIO SURGEON STRL SZ 6.5 (GLOVE) ×4 IMPLANT
GLOVE BIO SURGEON STRL SZ7.5 (GLOVE) ×6 IMPLANT
GLOVE BIO SURGEONS STRL SZ 6.5 (GLOVE) ×2
GLOVE BIOGEL PI IND STRL 6.5 (GLOVE) ×2 IMPLANT
GLOVE BIOGEL PI IND STRL 7.0 (GLOVE) ×1 IMPLANT
GLOVE BIOGEL PI IND STRL 8 (GLOVE) ×2 IMPLANT
GLOVE BIOGEL PI INDICATOR 6.5 (GLOVE) ×4
GLOVE BIOGEL PI INDICATOR 7.0 (GLOVE) ×2
GLOVE BIOGEL PI INDICATOR 8 (GLOVE) ×4
GLOVE ECLIPSE 6.5 STRL STRAW (GLOVE) ×3 IMPLANT
GLOVE SS BIOGEL STRL SZ 6.5 (GLOVE) ×1 IMPLANT
GLOVE SUPERSENSE BIOGEL SZ 6.5 (GLOVE) ×2
GOWN STRL REUS W/ TWL LRG LVL3 (GOWN DISPOSABLE) ×4 IMPLANT
GOWN STRL REUS W/TWL LRG LVL3 (GOWN DISPOSABLE) ×8
GRAFT GORETEX STRT 4-7X45 (Vascular Products) ×3 IMPLANT
KIT BASIN OR (CUSTOM PROCEDURE TRAY) ×3 IMPLANT
KIT ROOM TURNOVER OR (KITS) ×3 IMPLANT
LIQUID BAND (GAUZE/BANDAGES/DRESSINGS) ×3 IMPLANT
NS IRRIG 1000ML POUR BTL (IV SOLUTION) ×3 IMPLANT
PACK CV ACCESS (CUSTOM PROCEDURE TRAY) ×3 IMPLANT
PAD ARMBOARD 7.5X6 YLW CONV (MISCELLANEOUS) ×6 IMPLANT
SPONGE SURGIFOAM ABS GEL 100 (HEMOSTASIS) IMPLANT
SUT PROLENE 6 0 BV (SUTURE) ×12 IMPLANT
SUT VIC AB 2-0 CTB1 (SUTURE) IMPLANT
SUT VIC AB 3-0 SH 27 (SUTURE) ×4
SUT VIC AB 3-0 SH 27X BRD (SUTURE) ×2 IMPLANT
SUT VICRYL 4-0 PS2 18IN ABS (SUTURE) ×6 IMPLANT
UNDERPAD 30X30 INCONTINENT (UNDERPADS AND DIAPERS) IMPLANT
WATER STERILE IRR 1000ML POUR (IV SOLUTION) IMPLANT

## 2015-01-14 NOTE — H&P (View-Only) (Signed)
Patient ID: [name], female   DOB: 1970-06-16, 44 y.o.   MRN: [id number]   [facility]  Patient name: [name] MRN: [id number] DOB: Jan 30, 1971 Sex: female  REASON FOR VISIT: Prolonged bleeding from right thigh AVG. Referred by Dr. Jamal Maes.  HPI: [name] is a 44 y.o. female who had a right thigh AV graft placed in 2013. This has been surgically revised once. According to the patient, she has had multiple procedures at [facility]. In reviewing her films she has had a stent placed in the iliac vein on the right. Her most recent study was done by the interventional radiologist last month and she had successful venoplasty of an outflow stenosis in the femoral vein and iliac vein. 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 She also has significant pain associated with her fistula. She feels that this pain is no longer tolerable.  She has a complicated access history. On the right side she had a DRIL procedure done because of steal. 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 This was not successful and therefore the right arm was abandoned and was not felt to be amenable to further access. 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 For these reasons she is not a candidate for upper extremity access. Her only remaining option for access is a left thigh AV graft.   Past Medical History  Diagnosis Date  . Hyperlipidemia   . Alopecia   . Obesity   . Iron deficiency   . ESRD (end stage renal disease)     S/p pancreatic and kidney transplant, but back on HD 2008, dialysis at [facility], [place], Sat  . RLS (restless legs syndrome)   . Injury of conjunctiva and corneal abrasion of right eye without foreign body   . Cellulitis   . Anemia   . Blood transfusion 2004    "when I had my transplant"  . Migraine     . Hyperparathyroidism, secondary   . Diabetic retinopathy   . Hypertension     takes Metoprolol and Exforge daily  . Depression     takes Klonopin nightly  . Diabetes mellitus type 1     Pt states diagnosed at age 62 with prior episodes of DKA. S/p pancreatic transplant   . GERD (gastroesophageal reflux disease)     takes Protonix daily  . Bronchitis   . Migraine   . Asthma   . Emphysema of lung   . Angina     none in past 2 years.  . Stomach pain     Hx: of chronic  . Pneumonia 2012    "double"  . Dialysis patient     tues,thurs,sat  . 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  Surgery was done at [facility].     . DKA (diabetic ketoacidoses) 12/01/2014   Family History  Problem Relation Age of Onset  . Hypertension Mother   . Kidney disease Mother   . Diabetes Mother   . Hypertension Father   . Kidney disease Father   . Diabetes Father    SOCIAL HISTORY: History  Substance Use Topics  .  Smoking status: Never Smoker   . Smokeless tobacco: Never Used  . Alcohol Use: No   Allergies  Allergen Reactions  . Depakote [Divalproex Sodium] Other (See Comments)    Pt gets "delirious"  . Morphine And Related Nausea And Vomiting and Other (See Comments)    "makes me delirious"  . Penicillins Anaphylaxis    Tolerated Zosyn Dec 2014  . Tramadol Nausea And Vomiting  . Imitrex [Sumatriptan] Other (See Comments)    seizures  . Vicodin [Hydrocodone-Acetaminophen] Itching and Rash   Current Outpatient Prescriptions  Medication Sig Dispense Refill  . albuterol (PROVENTIL HFA;VENTOLIN HFA) 108 (90 BASE) MCG/ACT inhaler Inhale 2 puffs into the lungs every 4 (four) hours as needed for wheezing or shortness of breath (as per home regimen). 1 Inhaler 0  . aspirin 325 MG tablet Take  1 tablet (325 mg total) by mouth daily. 30 tablet 0  . calcium acetate (PHOSLO) 667 MG capsule Take 1 capsule (667 mg total) by mouth 2 (two) times daily. (Patient taking differently: Take 2,001 mg by mouth 3 (three) times daily with meals. ) 30 capsule 0  . clonazePAM (KLONOPIN) 0.5 MG tablet Take 0.5 mg by mouth 2 (two) times daily.     . Darbepoetin Alfa (ARANESP) 40 MCG/0.4ML SOSY injection Inject 0.4 mLs (40 mcg total) into the vein every Thursday with hemodialysis. 8.4 mL   . doxercalciferol (HECTOROL) 4 MCG/2ML injection Inject 1 mL (2 mcg total) into the vein Every Tuesday,Thursday,and Saturday with dialysis. 2 mL   . doxycycline (VIBRA-TABS) 100 MG tablet Take 1 tablet (100 mg total) by mouth daily. 2 tablet 0  . gabapentin (NEURONTIN) 100 MG capsule Take 100 mg by mouth 2 (two) times daily.     . insulin aspart (NOVOLOG) 100 UNIT/ML injection Inject 6 Units into the skin 2 (two) times daily. 10 mL 0  . insulin glargine (LANTUS) 100 UNIT/ML injection Inject 0.07 mLs (7 Units total) into the skin 2 (two) times daily. 10 mL 11  . lidocaine (LIDODERM) 5 % Place 1 patch onto the skin daily. Remove & Discard patch within 12 hours or as directed by MD 10 patch 0  . metoCLOPramide (REGLAN) 10 MG tablet Take 1 tablet (10 mg total) by mouth 4 (four) times daily -  before meals and at bedtime. 60 tablet 0  . metoprolol succinate (TOPROL-XL) 100 MG 24 hr tablet Take 1 tablet (100 mg total) by mouth daily. Take with or immediately following a meal. (Patient taking differently: Take 100 mg by mouth daily after supper. Take with or immediately following a meal.) 30 tablet 0  . montelukast (SINGULAIR) 10 MG tablet Take 10 mg by mouth at bedtime.    . multivitamin (RENA-VIT) TABS tablet Take 1 tablet by mouth daily. 30 tablet 0  . Nutritional Supplements (FEEDING SUPPLEMENT, NEPRO CARB STEADY,) LIQD Take 237 mLs by mouth 2 (two) times daily between meals. 60 Can 0  . pantoprazole (PROTONIX) 40 MG tablet Take  1 tablet (40 mg total) by mouth at bedtime. 30 tablet 0  . promethazine (PHENERGAN) 25 MG tablet Take 1 tablet (25 mg total) by mouth every 6 (six) hours as needed for nausea or vomiting. 10 tablet 0  . simvastatin (ZOCOR) 20 MG tablet Take 1 tablet (20 mg total) by mouth at bedtime. 30 tablet 0  . zolpidem (AMBIEN) 5 MG tablet Take 5 mg by mouth at bedtime as needed. sleep    . [DISCONTINUED] NORVASC 10 MG tablet Take 1 tablet  by mouth Daily.     No current facility-administered medications for this visit.   REVIEW OF SYSTEMS: Valu.Nieves ] denotes positive finding; [  ] denotes negative finding  CARDIOVASCULAR:  [ ]  chest pain   [ ]  chest pressure   [ ]  palpitations   [ ]  orthopnea   [ ]  dyspnea on exertion   [ ]  claudication   [ ]  rest pain   [ ]  DVT   [ ]  phlebitis PULMONARY:   [ ]  productive cough   [ ]  asthma   [ ]  wheezing NEUROLOGIC:   [ ]  weakness  [ ]  paresthesias  [ ]  aphasia  [ ]  amaurosis  [ ]  dizziness HEMATOLOGIC:   [ ]  bleeding problems   [ ]  clotting disorders MUSCULOSKELETAL:  [ ]  joint pain   [ ]  joint swelling Valu.Nieves ] leg swelling GASTROINTESTINAL: [ ]   blood in stool  [ ]   hematemesis GENITOURINARY:  [ ]   dysuria  [ ]   hematuria PSYCHIATRIC:  [ ]  history of major depression INTEGUMENTARY:  [ ]  rashes  [ ]  ulcers CONSTITUTIONAL:  [ ]  fever   [ ]  chills  PHYSICAL EXAM: Filed Vitals:   12/29/14 1404 12/29/14 1408  BP: 180/80 166/84  Pulse: 82 80  Temp: 97.9 F (36.6 C)   TempSrc: Oral   Resp: 18   Height: 5\' 1"  (1.549 m)   Weight: 151 lb (68.493 kg)   SpO2: 99%    GENERAL: The patient is a well-nourished female, in no acute distress. The vital signs are documented above. CARDIAC: There is a regular rate and rhythm.  VASCULAR: she has palpable dorsalis pedis pulses bilaterally. There is an excellent thrill in her right thigh AV graft. It is slightly pulsatile. PULMONARY: There is good air exchange bilaterally without wheezing or rales. ABDOMEN: Soft and non-tender with  normal pitched bowel sounds.  MUSCULOSKELETAL: There are no major deformities or cyanosis. NEUROLOGIC: No focal weakness or paresthesias are detected. SKIN: There are no ulcers or rashes noted. PSYCHIATRIC: The patient has a normal affect.  DATA:  I have reviewed her fistulogram that was done by interventional radiology last month. There was some outflow stenosis that was successfully ballooned. There is a stent in the iliac vein. The graft itself looked fairly good. It does not look like the arterial anastomosis was studied however by exam there is no significant inflow problems.  MEDICAL ISSUES:  STAGE V CHRONIC KIDNEY DISEASE:  I do not see anyway to surgically revise her right thigh AV graft given the stent in the iliac vein. I do not see any problems with the graft itself. I've explained that from my standpoint only remaining option is a left thigh AV graft. I have encouraged her to continue to use her right thigh AV graft as long as possible before placing a left thigh AV graft. However she feels that [place] pain is not tolerable and therefore wishes to proceed with placement of a left thigh AV graft. This has been scheduled for 01/14/2015. We will keep her overnight and she can dialyze in the hospital before discharge the following day.  Deitra Mayo [facility] [place]: [phone]

## 2015-01-14 NOTE — Interval H&P Note (Signed)
History and Physical Interval Note:  01/14/2015 10:12 AM  [name]  has presented today for surgery, with the diagnosis of End Stage Renal Disease N18.6  The various methods of treatment have been discussed with the patient and family. After consideration of risks, benefits and other options for treatment, the patient has consented to  Procedure(s): INSERTION OF ARTERIOVENOUS (AV) GORE-TEX GRAFT THIGH (Left) as a surgical intervention .  The patient's history has been reviewed, patient examined, no change in status, stable for surgery.  I have reviewed the patient's chart and labs.  Questions were answered to the patient's satisfaction.     Deitra Mayo

## 2015-01-14 NOTE — Anesthesia Preprocedure Evaluation (Signed)
Anesthesia Evaluation  Patient identified by MRN, date of birth, ID band Patient awake    Reviewed: Allergy & Precautions, H&P , NPO status , Patient's Chart, lab work & pertinent test results  Airway Mallampati: II  TM Distance: >3 FB     Dental  (+) Teeth Intact, Dental Advisory Given   Pulmonary asthma , pneumonia -, COPD breath sounds clear to auscultation        Cardiovascular hypertension, Pt. on medications and Pt. on home beta blockers + angina + Peripheral Vascular Disease and +CHF Rhythm:Regular Rate:Normal     Neuro/Psych  Headaches, PSYCHIATRIC DISORDERS Depression CVA, Residual Symptoms    GI/Hepatic Neg liver ROS, GERD-  ,  Endo/Other  diabetes, Type 2, Oral Hypoglycemic Agents  Renal/GU ESRFRenal disease     Musculoskeletal negative musculoskeletal ROS (+)   Abdominal   Peds  Hematology  (+) anemia ,   Anesthesia Other Findings   Reproductive/Obstetrics negative OB ROS                             Anesthesia Physical  Anesthesia Plan  ASA: III  Anesthesia Plan: General   Post-op Pain Management:    Induction: Intravenous  Airway Management Planned: LMA  Additional Equipment:   Intra-op Plan:   Post-operative Plan: Extubation in OR  Informed Consent: I have reviewed the patients History and Physical, chart, labs and discussed the procedure including the risks, benefits and alternatives for the proposed anesthesia with the patient or authorized representative who has indicated his/her understanding and acceptance.   Dental advisory given  Plan Discussed with: CRNA  Anesthesia Plan Comments:         Anesthesia Quick Evaluation                                  Anesthesia Evaluation  Patient identified by MRN, date of birth, ID band Patient awake    Reviewed: Allergy & Precautions, H&P , NPO status , Patient's Chart, lab work & pertinent test results,  reviewed documented beta blocker date and time   History of Anesthesia Complications (+) PONV  Airway       Dental   Pulmonary COPD         Cardiovascular hypertension, Pt. on medications     Neuro/Psych    GI/Hepatic GERD-  Medicated and Controlled,  Endo/Other  diabetes, Type 2  Renal/GU Dialysis and CRFRenal disease     Musculoskeletal   Abdominal   Peds  Hematology   Anesthesia Other Findings   Reproductive/Obstetrics                          Anesthesia Physical Anesthesia Plan  ASA: III  Anesthesia Plan: MAC   Post-op Pain Management:    Induction: Intravenous  Airway Management Planned:   Additional Equipment:   Intra-op Plan:   Post-operative Plan:   Informed Consent:   Plan Discussed with:   Anesthesia Plan Comments:         Anesthesia Quick Evaluation

## 2015-01-14 NOTE — Transfer of Care (Signed)
Immediate Anesthesia Transfer of Care Note  Patient: [name]  Procedure(s) Performed: Procedure(s): INSERTION OF LEFT ARTERIOVENOUS (AV) GORE-TEX GRAFT THIGH (Left)  Patient Location: PACU  Anesthesia Type:General  Level of Consciousness: awake, oriented, sedated, patient cooperative and responds to stimulation  Airway & Oxygen Therapy: Patient Spontanous Breathing and Patient connected to nasal cannula oxygen  Post-op Assessment: Report given to RN, Post -op Vital signs reviewed and stable, Patient moving all extremities and Patient moving all extremities X 4  Post vital signs: Reviewed and stable  Last Vitals:  Filed Vitals:   01/14/15 0833  BP: 143/66  Pulse: 72  Temp: 36.6 C  Resp: 16    Complications: No apparent anesthesia complications

## 2015-01-14 NOTE — Anesthesia Postprocedure Evaluation (Signed)
Anesthesia Post Note  Patient: [name]  Procedure(s) Performed: Procedure(s) (LRB): INSERTION OF LEFT ARTERIOVENOUS (AV) GORE-TEX GRAFT THIGH (Left)  Anesthesia type: General  Patient location: PACU  Post pain: Pain level controlled  Post assessment: Post-op Vital signs reviewed  Last Vitals: BP 107/45 mmHg  Pulse 93  Temp(Src) 36.9 C (Oral)  Resp 18  Wt 148 lb (67.132 kg)  SpO2 97%  LMP 12/20/2014 (Approximate)  Post vital signs: Reviewed  Level of consciousness: sedated  Complications: No apparent anesthesia complications

## 2015-01-14 NOTE — Progress Notes (Signed)
ANTIBIOTIC CONSULT NOTE - INITIAL  Pharmacy Consult for vancomycin Indication: surgical prophylaxis  Allergies  Allergen Reactions  . Depakote [Divalproex Sodium] Other (See Comments)    Pt gets "delirious"  . Morphine And Related Nausea And Vomiting and Other (See Comments)    "makes me delirious"  . Penicillins Anaphylaxis    Tolerated Zosyn Dec 2014  . Tramadol Nausea And Vomiting  . Imitrex [Sumatriptan] Other (See Comments)    seizures  . Vicodin [Hydrocodone-Acetaminophen] Itching and Rash    Patient Measurements: Weight: 148 lb (67.132 kg)  Vital Signs: Temp: 97.6 F (36.4 C) (08/19 1718) Temp Source: Oral (08/19 1718) BP: 83/42 mmHg (08/19 1718) Pulse Rate: 90 (08/19 1718) Intake/Output from previous day:   Intake/Output from this shift: Total I/O In: 500 [I.V.:500] Out: 50 [Blood:50]  Labs:  Recent Labs  01/14/15 0902  HGB 12.9   Estimated Creatinine Clearance: 9.3 mL/min (by C-G formula based on Cr of 6.88). No results for input(s): VANCOTROUGH, VANCOPEAK, VANCORANDOM, GENTTROUGH, GENTPEAK, GENTRANDOM, TOBRATROUGH, TOBRAPEAK, TOBRARND, AMIKACINPEAK, AMIKACINTROU, AMIKACIN in the last 72 hours.   Microbiology: Recent Results (from the past 720 hour(s))  MRSA PCR Screening     Status: None   Collection Time: 01/03/15 12:14 AM  Result Value Ref Range Status   MRSA by PCR NEGATIVE NEGATIVE Final    Comment:        The GeneXpert MRSA Assay (FDA approved for NASAL specimens only), is one component of a comprehensive MRSA colonization surveillance program. It is not intended to diagnose MRSA infection nor to guide or monitor treatment for MRSA infections.   Culture, blood (routine x 2)     Status: None   Collection Time: 01/03/15 12:18 AM  Result Value Ref Range Status   Specimen Description BLOOD CENTRAL LINE  Final   Special Requests BOTTLES DRAWN AEROBIC ONLY 8CC  Final   Culture NO GROWTH 5 DAYS  Final   Report Status 01/08/2015 FINAL  Final   Culture, blood (routine x 2)     Status: None   Collection Time: 01/03/15 12:20 AM  Result Value Ref Range Status   Specimen Description BLOOD RIGHT HAND  Final   Special Requests IN PEDIATRIC BOTTLE 2CC  Final   Culture NO GROWTH 5 DAYS  Final   Report Status 01/08/2015 FINAL  Final  Culture, blood (routine x 2)     Status: None   Collection Time: 01/06/15 12:03 AM  Result Value Ref Range Status   Specimen Description BLOOD RIGHT ANTECUBITAL  Final   Special Requests IN PEDIATRIC BOTTLE 3CC  Final   Culture NO GROWTH 5 DAYS  Final   Report Status 01/11/2015 FINAL  Final    Medical History: Past Medical History  Diagnosis Date  . Hyperlipidemia   . Alopecia   . Obesity   . Iron deficiency   . RLS (restless legs syndrome)   . Injury of conjunctiva and corneal abrasion of right eye without foreign body   . Cellulitis   . Anemia   . Blood transfusion 2004    "when I had my transplant"  . Migraine   . Hyperparathyroidism, secondary   . Diabetic retinopathy   . Hypertension     takes Metoprolol and Exforge daily  . Depression     takes Klonopin nightly  . Diabetes mellitus type 1     Pt states diagnosed at age 44 with prior episodes of DKA. S/p pancreatic transplant   . GERD (gastroesophageal reflux disease)  takes Protonix daily  . Bronchitis   . Migraine   . Asthma   . Emphysema of lung   . Angina     none in past 2 years.  . Stomach pain     Hx: of chronic  . Pneumonia 2012    "double"  . Dialysis patient     tues,thurs,sat  . 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  Sur[facility] was [facility].     . DKA (diabetic ketoacidoses) 12/01/2014  . Diastolic CHF, acute on chronic 01/02/2015  . Stroke 03/2014    ? TIA  . ESRD (end stage renal disease)     S/p  pancreatic and kidney transplant, but back on HD 2008, dialysis at [facility], [place], Sat    Assessment: 44 yof ESRD- TTS, now s/p placement of L thigh graft due to R thigh graft very painful. HD tentatively scheduled for tomorrow. Patient received 1g x 1 dose of vanc for surgical ppx today at noon. Vanc 1g q12h ordered - Consult to pharmacy to adjust abx for renal function. D/c'd vanc order and put in Vanc 750mg  IV qHD - TTS based on wt 67.1kg. Plan to d/c patient after HD tomorrow per renal note.  Goal of Therapy:  Infection prevention post-surgery  Plan:  Vanc 750mg  IV qHD-Sat x 1 dose Plan to d/c patient after HD tomorrow   Elicia Lamp, PharmD Clinical Pharmacist Pager [phone] 01/14/2015 5:50 PM

## 2015-01-14 NOTE — Op Note (Signed)
    NAME: BRACHA FRANKOWSKI   MRN: [id number] DOB: 08/20/1970    DATE OF OPERATION: 01/14/2015  PREOP DIAGNOSIS: Stage V chronic kidney disease  POSTOP DIAGNOSIS: Same  PROCEDURE: New left thigh AV graft (4-7 mm PTFE graft)  SURGEON: Judeth Cornfield. Scot Dock, MD, FACS  ASSIST: Leontine Locket, PA, Delena Serve, RNFA  ANESTHESIA: Gen.   EBL: minimal  INDICATIONS: LEILANEE RIGHETTI is a 44 y.o. female who has a right thigh graft which is very painful and therefore were placed in the graft of left thigh so that this can ultimately be used for dialysis.  FINDINGS: the femoral vein had some old clot and scar tissue.  TECHNIQUE: The patient was taken to the operating room and received a general anesthetic. The left thigh was prepped and draped in usual sterile fashion. Given her body habitus, I made an oblique incision above the inguinal creases I thought this would be best for wound healing. The common femoral artery was dissected free and had a good pulse. The adjacent vein was identified. The saphenous vein appeared somewhat sclerotic and therefore elected to do the venous anastomosis to the femoral vein. A counterincision was made in the thigh. A 4-7 mm PTFE graft was tunneled in a loop fashion in the thigh with the arterial aspect of the graft along the lateral aspect of the thigh. The patient was then heparinized. The femoral artery was clamped proximally and distally and longitudinal arteriotomy was made. A segment of the 4 mm end of the graft was excised, the graft slightly specially, and sewn into side to the common femoral artery using continuous 60 proline suture. The graft was then pulled the appropriate length for anastomosis to the femoral vein. The vein was clamped proximal and distally and opened longitudinally. There was some old clot present here and also some scar tissue. I excised one valve. The graft was slightly spatulated and sewn into side to the vein using continuous 60 proline  suture. At completion was a good thrill in the fistula. The heparin was partially reversed with protamine. Hemostasis was obtained in the wounds. The counterincision was closed with a deeper 3-0 Vicryl and the skin closed with 4-0 Vicryl. The groin incision was closed with 2 deep layers of 3-0 Vicryl and the skin closed with 4-0 Vicryl. A sterile dressing was applied. The patient tolerated the procedure well and was transferred to the recovery room in stable condition. All needle and sponge counts were correct.  Deitra Mayo, MD, FACS [facility]  DATE OF DICTATION:   01/14/2015

## 2015-01-14 NOTE — Progress Notes (Signed)
Pt received at this time from PACU alert, verbal with no noted distress. Pt stable, neuro intact. Surgical site to left groin dry and intact. Pt rates pain 8/10 sharp, throbbing pain during assessment but had just received pain medication prior to arriving to unit. She states that she has some nausea due to the pain medication administered. Safety measures in place. Call bell within reach. Will continue to monitor.

## 2015-01-14 NOTE — Anesthesia Procedure Notes (Signed)
Procedure Name: LMA Insertion Date/Time: 01/14/2015 11:15 AM Performed by: Jacquiline Doe A Pre-anesthesia Checklist: Patient identified, Emergency Drugs available, Suction available, Patient being monitored and Timeout performed Patient Re-evaluated:Patient Re-evaluated prior to inductionOxygen Delivery Method: Circle system utilized Preoxygenation: Pre-oxygenation with 100% oxygen Intubation Type: IV induction Ventilation: Mask ventilation without difficulty LMA: LMA inserted LMA Size: 4.0 Tube type: Oral Number of attempts: 1 Placement Confirmation: positive ETCO2 and breath sounds checked- equal and bilateral Tube secured with: Tape Dental Injury: Teeth and Oropharynx as per pre-operative assessment

## 2015-01-14 NOTE — Consult Note (Signed)
Indication for Consultation:  Management of ESRD/hemodialysis; anemia, hypertension/volume and secondary hyperparathyroidism  HPI: [name] is a 44 y.o. female who is being admitted s/p L thigh AVG placement by Dr Scot Dock. She receives HD TTS [facility], history of HTN, DM. She has a functioning R thigh graft but it is very painful so she decided to pursue another option for permanent access as the R thigh AVG was unable to be revised. Will arrange HD while admitted, plans for DC after HD tomorrow.   Past Medical History  Diagnosis Date  . Hyperlipidemia   . Alopecia   . Obesity   . Iron deficiency   . RLS (restless legs syndrome)   . Injury of conjunctiva and corneal abrasion of right eye without foreign body   . Cellulitis   . Anemia   . Blood transfusion 2004    "when I had my transplant"  . Migraine   . Hyperparathyroidism, secondary   . Diabetic retinopathy   . Hypertension     takes Metoprolol and Exforge daily  . Depression     takes Klonopin nightly  . Diabetes mellitus type 1     Pt states diagnosed at age 29 with prior episodes of DKA. S/p pancreatic transplant   . GERD (gastroesophageal reflux disease)     takes Protonix daily  . Bronchitis   . Migraine   . Asthma   . Emphysema of lung   . Angina     none in past 2 years.  . Stomach pain     Hx: of chronic  . Pneumonia 2012    "double"  . Dialysis patient     tues,thurs,sat  . 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  Surgery was done at [facility].     . DKA (diabetic ketoacidoses) 12/01/2014  . Diastolic CHF, acute on chronic 01/02/2015  . Stroke 03/2014    ? TIA  . ESRD (end stage renal disease)     S/p pancreatic and kidney transplant, but back on HD 2008, dialysis at [place], Sat    Past Surgical History  Procedure Laterality Date  . 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 Thyroglobulin      x 7  . Av fistula placement      right upper arm  . Cholecystectomy  1995  .  hd graft placement/removal      "had 2 in my left upper arm"  . Eye surgery    . Retinopathy surgery    . Tooth extraction  10/10/11    X's two  . Insertion of dialysis catheter  12/08/2011    Procedure: INSERTION OF DIALYSIS CATHETER;  Surgeon: Angelia Mould, MD;  Location: [place];  Service: Vascular;  Laterality: Left;  . Av fistula placement  01/22/2012    Procedure: INSERTION OF ARTERIOVENOUS (AV) GORE-TEX GRAFT ARM;  Surgeon: Angelia Mould, MD;  Location: [place];  Service: Vascular;  Laterality: Left;  Ultrasound guided.  . Av fistula placement  03/14/2012    Procedure: INSERTION OF ARTERIOVENOUS (AV) GORE-TEX GRAFT THIGH;  Surgeon: Angelia Mould, MD;  Location: [place];  Service: Vascular;  Laterality: Right;  . False aneurysm repair Right 01/02/2013    Procedure: Excision of lymphocele  in right thigh.;  Surgeon: Angelia Mould, MD;  Location: [place];  Service: Vascular;  Laterality: Right;  . Carpal tunnel release Right 03/02/2013    Procedure: CARPAL TUNNEL RELEASE;  Surgeon: Tennis Must, MD;  Location: [place];  Service: Orthopedics;  Laterality: Right;  . Flexible sigmoidoscopy N/A 03/24/2013    Procedure: FLEXIBLE SIGMOIDOSCOPY;  Surgeon: Cleotis Nipper, MD;  Location: [facility] ENDOSCOPY;  Service: Endoscopy;  Laterality: N/A;  . Revision of arteriovenous goretex graft Right 04/02/2013    Procedure: REPAIR OF PSEUDOANEURYSM OF ARTERIOVENOUS GORETEX GRAFT;  Surgeon: Angelia Mould, MD;  Location: [place];  Service: Vascular;  Laterality: Right;  . Carpal tunnel release Left 10/16/2013    Procedure: LEFT CARPAL TUNNEL RELEASE;  Surgeon: Tennis Must, MD;  Location: [place];  Service: Orthopedics;   Laterality: Left;  . Unilateral upper extremeity angiogram N/A 11/12/2011    Procedure: UNILATERAL UPPER Anselmo Rod;  Surgeon: Angelia Mould, MD;  Location: [facility] CATH LAB;  Service: Cardiovascular;  Laterality: N/A;  . Fistulogram Left 03/03/2012    Procedure: FISTULOGRAM;  Surgeon: Angelia Mould, MD;  Location: [facility] CATH LAB;  Service: Cardiovascular;  Laterality: Left;   Family History  Problem Relation Age of Onset  . Hypertension Mother   . Kidney disease Mother   . Diabetes Mother   . Hypertension Father   . Kidney disease Father   . Diabetes Father    Social History:  reports that she has never smoked. She has never used smokeless tobacco. She reports that she does not drink alcohol or use illicit drugs. Allergies  Allergen Reactions  . Depakote [Divalproex Sodium] Other (See Comments)    Pt gets "delirious"  . Morphine And Related Nausea And Vomiting and Other (See Comments)    "makes me delirious"  . Penicillins Anaphylaxis    Tolerated Zosyn Dec 2014  . Tramadol Nausea And Vomiting  . Imitrex [Sumatriptan] Other (See Comments)    seizures  . Vicodin [Hydrocodone-Acetaminophen] Itching and Rash   Prior to Admission medications   Medication Sig Start Date End Date Taking? Authorizing Provider  albuterol (PROVENTIL HFA;VENTOLIN HFA) 108 (90 BASE) MCG/ACT inhaler Inhale 2 puffs into the lungs every 4 (four) hours as needed for wheezing or shortness of breath (as per home regimen). 02/20/13  Yes Kinnie Feil, MD  amLODipine (NORVASC) 10 MG tablet Take 10 mg by mouth at bedtime.  11/17/14  Yes Historical Provider, MD  aspirin 325 MG tablet Take 1 tablet (325 mg total) by mouth daily. 05/20/14  Yes Velvet Bathe, MD  calcium acetate (PHOSLO) 667 MG capsule Take 1 capsule (667 mg total) by mouth 2 (two) times daily. Patient taking differently: Take 2,001 mg by mouth 3 (three) times daily with meals.  11/16/14  Yes Theodis Blaze, MD  clonazePAM (KLONOPIN) 0.5  MG tablet Take 1 mg by mouth at bedtime.    Yes Historical Provider, MD  Darbepoetin Alfa (ARANESP) 40 MCG/0.4ML SOSY injection Inject 0.4 mLs (40 mcg total) into the vein every Thursday with hemodialysis. 08/05/14  Yes Geradine Girt, DO  gabapentin (NEURONTIN) 100 MG capsule Take 100 mg by mouth daily.    Yes Historical Provider, MD  guaiFENesin (MUCINEX) 600 MG 12 hr tablet Take 1 tablet (600 mg total) by mouth 2 (two) times daily. 01/07/15  Yes Barton Dubois, MD  insulin glargine (LANTUS) 100 UNIT/ML injection Inject 0.07 mLs (7 Units total) into the skin 2 (two)  times daily. 08/05/14  Yes Geradine Girt, DO  insulin lispro (HUMALOG KWIKPEN) 100 UNIT/ML KiwkPen Inject 5-6 Units into the skin 2 (two) times daily. Inject 6 units subcutaneously in the morning and 5 units in the evening   Yes Historical Provider, MD  levofloxacin (LEVAQUIN) 500 MG tablet Take 1 tablet (500 mg total) by mouth every other day. 01/07/15 01/14/15 Yes Barton Dubois, MD  lidocaine (LIDODERM) 5 % Place 1 patch onto the skin daily. Remove & Discard patch within 12 hours or as directed by MD 12/06/14  Yes Kelvin Cellar, MD  metoCLOPramide (REGLAN) 10 MG tablet Take 1 tablet (10 mg total) by mouth 4 (four) times daily -  before meals and at bedtime. 12/06/14  Yes Kelvin Cellar, MD  metoprolol succinate (TOPROL-XL) 50 MG 24 hr tablet Take 1 tablet (50 mg total) by mouth daily. Take with or immediately following a meal. Patient taking differently: Take 50 mg by mouth at bedtime. Take with or immediately following a meal. 01/07/15  Yes Barton Dubois, MD  montelukast (SINGULAIR) 10 MG tablet Take 10 mg by mouth at bedtime.   Yes Historical Provider, MD  multivitamin (RENA-VIT) TABS tablet Take 1 tablet by mouth daily. 08/05/14  Yes Geradine Girt, DO  Nutritional Supplements (FEEDING SUPPLEMENT, NEPRO CARB STEADY,) LIQD Take 237 mLs by mouth 2 (two) times daily between meals. 04/23/14  Yes Orson Eva, MD  pantoprazole (PROTONIX) 40 MG  tablet Take 1 tablet (40 mg total) by mouth at bedtime. Patient taking differently: Take 40 mg by mouth daily. Takes daily in the afternoon 08/05/14  Yes Geradine Girt, DO  promethazine (PHENERGAN) 25 MG tablet Take 1 tablet (25 mg total) by mouth every 6 (six) hours as needed for nausea or vomiting. 12/06/14  Yes Kelvin Cellar, MD  simvastatin (ZOCOR) 20 MG tablet Take 1 tablet (20 mg total) by mouth at bedtime. 05/20/14  Yes Velvet Bathe, MD  zolpidem (AMBIEN) 5 MG tablet Take 5 mg by mouth at bedtime as needed. sleep 02/09/14  Yes Historical Provider, MD  doxercalciferol (HECTOROL) 4 MCG/2ML injection Inject 1 mL (2 mcg total) into the vein Every Tuesday,Thursday,and Saturday with dialysis. Patient not taking: Reported on 01/05/2015 08/05/14   Geradine Girt, DO  Oxycodone HCl 10 MG TABS Take 1 tablet (10 mg total) by mouth 3 (three) times daily as needed (for pain). 01/14/15   Gabriel Earing, PA-C   Current Facility-Administered Medications  Medication Dose Route Frequency Provider Last Rate Last Dose  . 0.9 %  sodium chloride infusion   Intravenous Continuous Angelia Mould, MD      . 0.9 %  sodium chloride infusion   Intravenous Continuous Angelia Mould, MD 10 mL/hr at 01/14/15 [id number]    . Chlorhexidine Gluconate Cloth 2 % PADS 6 each  6 each Topical Once Angelia Mould, MD      . hydrALAZINE (APRESOLINE) injection 5 mg  5 mg Intravenous Q20 Min PRN Samantha J Rhyne, PA-C      . HYDROmorphone (DILAUDID) 1 MG/ML injection           . HYDROmorphone (DILAUDID) 1 MG/ML injection           . HYDROmorphone (DILAUDID) injection 0.25-0.5 mg  0.25-0.5 mg Intravenous Q5 min PRN Nolon Nations, MD   0.5 mg at 01/14/15 1417  . labetalol (NORMODYNE,TRANDATE) injection 10 mg  10 mg Intravenous Q10 min PRN Samantha J Rhyne, PA-C      . meperidine (DEMEROL)  injection 6.25-12.5 mg  6.25-12.5 mg Intravenous Q5 min PRN Nolon Nations, MD      . metoprolol (LOPRESSOR) injection 2-5 mg  2-5  mg Intravenous Q2H PRN Samantha J Rhyne, PA-C      . oxyCODONE (Oxy IR/ROXICODONE) 5 MG immediate release tablet           . Oxycodone HCl TABS 10 mg  10 mg Oral TID PRN Hulen Shouts Rhyne, PA-C      . promethazine (PHENERGAN) injection 6.25-12.5 mg  6.25-12.5 mg Intravenous Q15 min PRN Nolon Nations, MD       Labs: Basic Metabolic Panel:  Recent Labs Lab 01/14/15 0902  NA 134*  K 4.3  GLUCOSE 93   Liver Function Tests: No results for input(s): AST, ALT, ALKPHOS, BILITOT, PROT, ALBUMIN in the last 168 hours. No results for input(s): LIPASE, AMYLASE in the last 168 hours. No results for input(s): AMMONIA in the last 168 hours. CBC:  Recent Labs Lab 01/14/15 0902  HGB 12.9  HCT 38.0   Cardiac Enzymes: No results for input(s): CKTOTAL, CKMB, CKMBINDEX, TROPONINI in the last 168 hours. CBG:  Recent Labs Lab 01/07/15 1709 01/14/15 0839 01/14/15 1333  GLUCAP 226* 102* 279*   Iron Studies: No results for input(s): IRON, TIBC, TRANSFERRIN, FERRITIN in the last 72 hours. Studies/Results: No results found.   Review of Systems: Pain in L thigh. Denies fever/chills Denies sob/chest pain/edema  Physical Exam: Filed Vitals:   01/14/15 1415 01/14/15 1430 01/14/15 1445 01/14/15 1457  BP: 93/38  88/42 109/45  Pulse: 94 95 94 97  Temp:      TempSrc:      Resp: 12 11 11 13   Weight:      SpO2: 97% 100% 97% 99%     General: Well developed, well nourished, in no acute distress. Sleeping- wakes easily Head: Normocephalic, atraumatic, sclera non-icteric, mucus membranes are moist Neck: Supple. JVD not elevated. Lungs: Clear bilaterally to auscultation without wheezes, rales, or rhonchi. Breathing is unlabored. Decreased effort and bs Heart: RRR with S1 S2. No murmurs, rubs, or gallops appreciated. Gr 2/6 M,   Abdomen: Soft, non-tender, non-distended with normoactive bowel sounds. No rebound/guarding. No obvious abdominal masses. Obese, liver down 4 cm M-S:  Strength and tone  appear normal for age. Lower extremities:without edema or ischemic changes, no open wounds  Neuro: Alert and oriented X 3. Moves all extremities spontaneously. Psych:  Drowsy. Responds to questions appropriately with a normal affect. Dialysis Access: R thigh AVG  +b/t      L thigh AVG +t placed 8/19  Dialysis Orders: TTS [facility] 3hr 29mins     400/1.5    67.5kgs   2K/2.5Ca+    R thigh AVG  L thigh AVG- placed 8/19 Hectorol 3 Venofer 50  Assessment/Plan: 1.   L thigh AVG- placed 8/19- Dr Scot Dock still has functioning R thigh AVG 2.  ESRD -  TTS NW- HD pending tomorrow, K+ 4.3 VOL xs 3.  Hypertension/volume  - 109/45 edw dec 0.5 kgs last week- and still leaving 0.5kgs under- lower as tolerated CAution not to lower bp too much with new access 4.  Anemia  - hgb 12.9- no ESA. Watch CBC post op 5.  Metabolic bone disease -  Last pth 163. Phos 7.4- cont phoslo when diet advanced.  6.  Nutrition - NPO in pacu. Renal diet when advanced 7. DM- per primary Very volatile  Shelle Iron, NP [facility] [phone] 01/14/2015, 3:05 PM I have  seen and examined this patient and agree with the plan of care seen , eval, examined and discussed with patient .  Duval Macleod L 01/14/2015, 5:17 PM

## 2015-01-14 NOTE — Discharge Instructions (Signed)
° ° °  01/14/2015 GIABELLA DUHART [id number] [birth year]  Surgeon(s): Angelia Mould, MD  Procedure(s): INSERTION OF LEFT ARTERIOVENOUS (AV) GORE-TEX GRAFT THIGH  x Do not stick graft for 4 weeks

## 2015-01-15 LAB — CBC WITH DIFFERENTIAL/PLATELET
BASOS PCT: 0 % (ref 0–1)
Basophils Absolute: 0 10*3/uL (ref 0.0–0.1)
Eosinophils Absolute: 0.3 10*3/uL (ref 0.0–0.7)
Eosinophils Relative: 5 % (ref 0–5)
HCT: 31.9 % — ABNORMAL LOW (ref 36.0–46.0)
Hemoglobin: 9.9 g/dL — ABNORMAL LOW (ref 12.0–15.0)
Lymphocytes Relative: 22 % (ref 12–46)
Lymphs Abs: 1.2 10*3/uL (ref 0.7–4.0)
MCH: 30.1 pg (ref 26.0–34.0)
MCHC: 31 g/dL (ref 30.0–36.0)
MCV: 97 fL (ref 78.0–100.0)
Monocytes Absolute: 0.5 10*3/uL (ref 0.1–1.0)
Monocytes Relative: 9 % (ref 3–12)
NEUTROS ABS: 3.4 10*3/uL (ref 1.7–7.7)
NEUTROS PCT: 64 % (ref 43–77)
PLATELETS: 177 10*3/uL (ref 150–400)
RBC: 3.29 MIL/uL — AB (ref 3.87–5.11)
RDW: 15.3 % (ref 11.5–15.5)
WBC: 5.3 10*3/uL (ref 4.0–10.5)

## 2015-01-15 LAB — BASIC METABOLIC PANEL
Anion gap: 14 (ref 5–15)
BUN: 28 mg/dL — AB (ref 6–20)
CO2: 22 mmol/L (ref 22–32)
CREATININE: 7.54 mg/dL — AB (ref 0.44–1.00)
Calcium: 8.5 mg/dL — ABNORMAL LOW (ref 8.9–10.3)
Chloride: 94 mmol/L — ABNORMAL LOW (ref 101–111)
GFR calc Af Amer: 7 mL/min — ABNORMAL LOW (ref >60)
GFR, EST NON AFRICAN AMERICAN: 6 mL/min — AB (ref >60)
GLUCOSE: 59 mg/dL — AB (ref 65–99)
Potassium: 4.4 mmol/L (ref 3.5–5.1)
SODIUM: 130 mmol/L — AB (ref 135–145)

## 2015-01-15 LAB — GLUCOSE, CAPILLARY
GLUCOSE-CAPILLARY: 404 mg/dL — AB (ref 65–99)
Glucose-Capillary: 50 mg/dL — ABNORMAL LOW (ref 65–99)
Glucose-Capillary: 74 mg/dL (ref 65–99)
Glucose-Capillary: 127 mg/dL — ABNORMAL HIGH (ref 65–99)

## 2015-01-15 LAB — RENAL FUNCTION PANEL
ALBUMIN: 3 g/dL — AB (ref 3.5–5.0)
ANION GAP: 9 (ref 5–15)
BUN: 15 mg/dL (ref 6–20)
CALCIUM: 8.1 mg/dL — AB (ref 8.9–10.3)
CO2: 26 mmol/L (ref 22–32)
Chloride: 97 mmol/L — ABNORMAL LOW (ref 101–111)
Creatinine, Ser: 4.41 mg/dL — ABNORMAL HIGH (ref 0.44–1.00)
GFR, EST AFRICAN AMERICAN: 13 mL/min — AB (ref >60)
GFR, EST NON AFRICAN AMERICAN: 11 mL/min — AB (ref >60)
Glucose, Bld: 87 mg/dL (ref 65–99)
PHOSPHORUS: 2.8 mg/dL (ref 2.5–4.6)
Potassium: 3.3 mmol/L — ABNORMAL LOW (ref 3.5–5.1)
SODIUM: 132 mmol/L — AB (ref 135–145)

## 2015-01-15 LAB — CBC
HCT: 31.5 % — ABNORMAL LOW (ref 36.0–46.0)
Hemoglobin: 10 g/dL — ABNORMAL LOW (ref 12.0–15.0)
MCH: 30.8 pg (ref 26.0–34.0)
MCHC: 31.7 g/dL (ref 30.0–36.0)
MCV: 96.9 fL (ref 78.0–100.0)
PLATELETS: 187 10*3/uL (ref 150–400)
RBC: 3.25 MIL/uL — ABNORMAL LOW (ref 3.87–5.11)
RDW: 15.3 % (ref 11.5–15.5)
WBC: 6 10*3/uL (ref 4.0–10.5)

## 2015-01-15 MED ORDER — OXYCODONE HCL 5 MG PO TABS
ORAL_TABLET | ORAL | Status: AC
  Filled 2015-01-15: qty 2

## 2015-01-15 MED ORDER — OXYCODONE HCL 10 MG PO TABS: 10.0000 mg | ORAL_TABLET | Freq: Three times a day (TID) | ORAL | Status: DC | PRN

## 2015-01-15 NOTE — Progress Notes (Addendum)
[facility] [place]  Subjective  - Doing well so far.  Currently in HD   Objective 111/56 82 98.6 F (37 C) (Oral) 18 99%  Intake/Output Summary (Last 24 hours) at 01/15/15 0759 Last data filed at 01/15/15 0616  Gross per 24 hour  Intake    980 ml  Output     50 ml  Net    930 ml    Left thigh with palpable thrill  Incision C/D/I Right thigh HD in progress  Assessment/Planning: POD # 1 New left thigh AV graft (4-7 mm PTFE graft) F/U PRN you may stick left thigh graft in 4 weeks D/C home after HD    Laurence Slate [facility] 01/15/2015 7:59 AM --  Laboratory Lab Results:  Recent Labs  01/14/15 0902 01/14/15 2059  WBC  --  8.2  HGB 12.9 11.2*  HCT 38.0 35.4*  PLT  --  208   BMET  Recent Labs  01/14/15 0902 01/14/15 2059  NA 134* 131*  K 4.3 5.3*  CL  --  95*  CO2  --  23  GLUCOSE 93 369*  BUN  --  22*  CREATININE  --  6.51*  CALCIUM  --  8.3*    COAG Lab Results  Component Value Date   INR 1.06 01/27/2010   INR 1.0 12/26/2006   No results found for: PTT  Agree with plans for d/c after HD today.  Deitra Mayo, MD, [place] [phone]

## 2015-01-15 NOTE — Progress Notes (Signed)
Subjective: Interval History: has complaints sore at op site.  Objective: Vital signs in last 24 hours: Temp:  [97.6 F (36.4 C)-98.9 F (37.2 C)] 98.6 F (37 C) (08/20 0413) Pulse Rate:  [72-103] 82 (08/20 0413) Resp:  [7-18] 18 (08/20 0413) BP: (83-143)/(38-66) 111/56 mmHg (08/20 0413) SpO2:  [91 %-100 %] 99 % (08/20 0413) Weight:  [67.132 kg (148 lb)-72.394 kg (159 lb 9.6 oz)] 72.394 kg (159 lb 9.6 oz) (08/19 1955) Weight change:   Intake/Output from previous day: 08/19 0701 - 08/20 0700 In: 980 [P.O.:480; I.V.:500] Out: 50 [Blood:50] Intake/Output this shift:    General appearance: cooperative, mild distress and moderately obese Resp: clear to auscultation bilaterally Cardio: S1, S2 normal GI: obese, pos bs, liver down 5 cm soft Extremities: old avg R groin using it,  New AVG B&T  little swelling Gr 2/6 M Lab Results:  Recent Labs  01/14/15 0902 01/14/15 2059  WBC  --  8.2  HGB 12.9 11.2*  HCT 38.0 35.4*  PLT  --  208   BMET:  Recent Labs  01/14/15 0902 01/14/15 2059  NA 134* 131*  K 4.3 5.3*  CL  --  95*  CO2  --  23  GLUCOSE 93 369*  BUN  --  22*  CREATININE  --  6.51*  CALCIUM  --  8.3*   No results for input(s): PTH in the last 72 hours. Iron Studies: No results for input(s): IRON, TIBC, TRANSFERRIN, FERRITIN in the last 72 hours.  Studies/Results: No results found.  I have reviewed the patient's current medications.  Assessment/Plan: 1ESRD for HD 2 DM volatile 3 Anemia acceptable 4HPTH meds 5 Obesity 6 gastroparesis 7 depression P HD, d/c,     LOS: 1 day   Kiylee Thoreson L 01/15/2015,8:06 AM

## 2015-01-15 NOTE — Progress Notes (Signed)
[name] to be D/C'd Home per MD order.  Discussed prescriptions and follow up appointments with the patient. Prescriptions given to patient, medication list explained in detail. Pt verbalized understanding.    Medication List    TAKE these medications        albuterol 108 (90 BASE) MCG/ACT inhaler  Commonly known as:  PROVENTIL HFA;VENTOLIN HFA  Inhale 2 puffs into the lungs every 4 (four) hours as needed for wheezing or shortness of breath (as per home regimen).     amLODipine 10 MG tablet  Commonly known as:  NORVASC  Take 10 mg by mouth at bedtime.     aspirin 325 MG tablet  Take 1 tablet (325 mg total) by mouth daily.     calcium acetate 667 MG capsule  Commonly known as:  PHOSLO  Take 1 capsule (667 mg total) by mouth 2 (two) times daily.     clonazePAM 0.5 MG tablet  Commonly known as:  KLONOPIN  Take 1 mg by mouth at bedtime.     Darbepoetin Alfa 40 MCG/0.4ML Sosy injection  Commonly known as:  ARANESP  Inject 0.4 mLs (40 mcg total) into the vein every Thursday with hemodialysis.     doxercalciferol 4 MCG/2ML injection  Commonly known as:  HECTOROL  Inject 1 mL (2 mcg total) into the vein Every Tuesday,Thursday,and Saturday with dialysis.     feeding supplement (NEPRO CARB STEADY) Liqd  Take 237 mLs by mouth 2 (two) times daily between meals.     gabapentin 100 MG capsule  Commonly known as:  NEURONTIN  Take 100 mg by mouth daily.     guaiFENesin 600 MG 12 hr tablet  Commonly known as:  MUCINEX  Take 1 tablet (600 mg total) by mouth 2 (two) times daily.     HUMALOG KWIKPEN 100 UNIT/ML KiwkPen  Generic drug:  insulin lispro  Inject 5-6 Units into the skin 2 (two) times daily. Inject 6 units subcutaneously in the morning and 5 units in the evening     insulin glargine 100 UNIT/ML injection  Commonly known as:  LANTUS  Inject 0.07 mLs (7 Units total) into the skin 2 (two) times daily.     lidocaine 5 %  Commonly known as:  LIDODERM  Place 1 patch  onto the skin daily. Remove & Discard patch within 12 hours or as directed by MD     metoCLOPramide 10 MG tablet  Commonly known as:  REGLAN  Take 1 tablet (10 mg total) by mouth 4 (four) times daily -  before meals and at bedtime.     metoprolol succinate 50 MG 24 hr tablet  Commonly known as:  TOPROL-XL  Take 1 tablet (50 mg total) by mouth daily. Take with or immediately following a meal.     montelukast 10 MG tablet  Commonly known as:  SINGULAIR  Take 10 mg by mouth at bedtime.     multivitamin Tabs tablet  Take 1 tablet by mouth daily.     Oxycodone HCl 10 MG Tabs  Take 1 tablet (10 mg total) by mouth 3 (three) times daily as needed (for pain).     Oxycodone HCl 10 MG Tabs  Take 1 tablet (10 mg total) by mouth 3 (three) times daily as needed (for pain).     pantoprazole 40 MG tablet  Commonly known as:  PROTONIX  Take 1 tablet (40 mg total) by mouth at bedtime.     promethazine 25 MG tablet  Commonly known as:  PHENERGAN  Take 1 tablet (25 mg total) by mouth every 6 (six) hours as needed for nausea or vomiting.     simvastatin 20 MG tablet  Commonly known as:  ZOCOR  Take 1 tablet (20 mg total) by mouth at bedtime.     zolpidem 5 MG tablet  Commonly known as:  AMBIEN  Take 5 mg by mouth at bedtime as needed. sleep      ASK your doctor about these medications        levofloxacin 500 MG tablet  Commonly known as:  LEVAQUIN  Take 1 tablet (500 mg total) by mouth every other day.  Ask about: Should I take this medication?        Filed Vitals:   01/15/15 1200  BP: 98/54  Pulse: 94  Temp:   Resp: 17    Skin clean, dry and intact without evidence of skin break down, no evidence of skin tears noted. IV catheter discontinued intact. Site without signs and symptoms of complications. Dressing and pressure applied. Pt denies pain at this time. No complaints noted.  An After Visit Summary was printed and given to the patient. Patient escorted via [place], and D/C home  via private auto.  [name] A 01/15/2015 2:55 PM

## 2015-01-15 NOTE — Procedures (Signed)
I was present at this session.  I have reviewed the session itself and made appropriate changes.  HD via R groin AVG. BP low 100s  Avion Patella L 8/20/20168:13 AM

## 2015-01-16 ENCOUNTER — Encounter (HOSPITAL_COMMUNITY): Payer: Self-pay | Admitting: Vascular Surgery

## 2015-01-18 ENCOUNTER — Telehealth: Payer: Self-pay | Admitting: Vascular Surgery

## 2015-01-18 NOTE — Telephone Encounter (Signed)
-----   Message from Mena Goes, RN sent at 01/15/2015  6:24 PM EDT ----- Regarding: Schedule   ----- Message -----    From: Gabriel Earing, PA-C    Sent: 01/14/2015  12:38 PM      To: [facility] Charge Pool  S/p left thigh AVG 01/14/15.  F/u with Dr. Scot Dock in 2 weeks.  Thanks, Aldona Bar

## 2015-01-18 NOTE — Telephone Encounter (Signed)
LM for pt re appt, dpm °

## 2015-01-19 ENCOUNTER — Telehealth: Payer: Self-pay

## 2015-01-19 ENCOUNTER — Encounter (HOSPITAL_COMMUNITY): Payer: Self-pay | Admitting: *Deleted

## 2015-01-19 ENCOUNTER — Ambulatory Visit (INDEPENDENT_AMBULATORY_CARE_PROVIDER_SITE_OTHER)
Admission: RE | Admit: 2015-01-19 | Discharge: 2015-01-19 | Disposition: A | Discharge status: Home / Self Care | Payer: Medicare Other | Source: Ambulatory Visit | Attending: Vascular Surgery | Admitting: Vascular Surgery

## 2015-01-19 ENCOUNTER — Inpatient Hospital Stay (HOSPITAL_COMMUNITY)
Admission: AD | Admit: 2015-01-19 | Discharge: 2015-01-23 | DRG: 299 | Disposition: A | Discharge status: Home / Self Care | Payer: Medicare Other | Source: Ambulatory Visit | Attending: Vascular Surgery | Admitting: Vascular Surgery

## 2015-01-19 ENCOUNTER — Encounter: Payer: Self-pay | Admitting: Vascular Surgery

## 2015-01-19 ENCOUNTER — Ambulatory Visit (INDEPENDENT_AMBULATORY_CARE_PROVIDER_SITE_OTHER): Payer: Self-pay | Admitting: Vascular Surgery

## 2015-01-19 VITALS — BP 107/59 | HR 72 | Temp 97.7°F | Resp 16 | Ht 61.5 in | Wt 158.0 lb

## 2015-01-19 DIAGNOSIS — I120 Hypertensive chronic kidney disease with stage 5 chronic kidney disease or end stage renal disease: Secondary | ICD-10-CM | POA: Diagnosis present

## 2015-01-19 DIAGNOSIS — Z885 Allergy status to narcotic agent status: Secondary | ICD-10-CM

## 2015-01-19 DIAGNOSIS — I82409 Acute embolism and thrombosis of unspecified deep veins of unspecified lower extremity: Secondary | ICD-10-CM | POA: Diagnosis present

## 2015-01-19 DIAGNOSIS — E875 Hyperkalemia: Secondary | ICD-10-CM | POA: Diagnosis present

## 2015-01-19 DIAGNOSIS — M79605 Pain in left leg: Secondary | ICD-10-CM

## 2015-01-19 DIAGNOSIS — M7989 Other specified soft tissue disorders: Secondary | ICD-10-CM | POA: Diagnosis not present

## 2015-01-19 DIAGNOSIS — I82412 Acute embolism and thrombosis of left femoral vein: Secondary | ICD-10-CM | POA: Diagnosis present

## 2015-01-19 DIAGNOSIS — E785 Hyperlipidemia, unspecified: Secondary | ICD-10-CM | POA: Diagnosis present

## 2015-01-19 DIAGNOSIS — Z888 Allergy status to other drugs, medicaments and biological substances status: Secondary | ICD-10-CM

## 2015-01-19 DIAGNOSIS — Z794 Long term (current) use of insulin: Secondary | ICD-10-CM

## 2015-01-19 DIAGNOSIS — M898X9 Other specified disorders of bone, unspecified site: Secondary | ICD-10-CM | POA: Diagnosis present

## 2015-01-19 DIAGNOSIS — E1022 Type 1 diabetes mellitus with diabetic chronic kidney disease: Secondary | ICD-10-CM | POA: Diagnosis present

## 2015-01-19 DIAGNOSIS — Z992 Dependence on renal dialysis: Secondary | ICD-10-CM | POA: Diagnosis not present

## 2015-01-19 DIAGNOSIS — G2581 Restless legs syndrome: Secondary | ICD-10-CM | POA: Diagnosis present

## 2015-01-19 DIAGNOSIS — D649 Anemia, unspecified: Secondary | ICD-10-CM | POA: Diagnosis present

## 2015-01-19 DIAGNOSIS — Z86718 Personal history of other venous thrombosis and embolism: Secondary | ICD-10-CM | POA: Insufficient documentation

## 2015-01-19 DIAGNOSIS — Z7982 Long term (current) use of aspirin: Secondary | ICD-10-CM | POA: Diagnosis not present

## 2015-01-19 DIAGNOSIS — E10319 Type 1 diabetes mellitus with unspecified diabetic retinopathy without macular edema: Secondary | ICD-10-CM | POA: Diagnosis present

## 2015-01-19 DIAGNOSIS — Z940 Kidney transplant status: Secondary | ICD-10-CM

## 2015-01-19 DIAGNOSIS — Z48812 Encounter for surgical aftercare following surgery on the circulatory system: Secondary | ICD-10-CM

## 2015-01-19 DIAGNOSIS — Z79899 Other long term (current) drug therapy: Secondary | ICD-10-CM

## 2015-01-19 DIAGNOSIS — I5032 Chronic diastolic (congestive) heart failure: Secondary | ICD-10-CM | POA: Diagnosis present

## 2015-01-19 DIAGNOSIS — J45909 Unspecified asthma, uncomplicated: Secondary | ICD-10-CM | POA: Diagnosis present

## 2015-01-19 DIAGNOSIS — M79652 Pain in left thigh: Secondary | ICD-10-CM

## 2015-01-19 DIAGNOSIS — Z8673 Personal history of transient ischemic attack (TIA), and cerebral infarction without residual deficits: Secondary | ICD-10-CM | POA: Diagnosis not present

## 2015-01-19 DIAGNOSIS — N186 End stage renal disease: Secondary | ICD-10-CM | POA: Diagnosis present

## 2015-01-19 DIAGNOSIS — R55 Syncope and collapse: Secondary | ICD-10-CM | POA: Diagnosis not present

## 2015-01-19 DIAGNOSIS — N2581 Secondary hyperparathyroidism of renal origin: Secondary | ICD-10-CM | POA: Diagnosis present

## 2015-01-19 DIAGNOSIS — Z9483 Pancreas transplant status: Secondary | ICD-10-CM

## 2015-01-19 DIAGNOSIS — J439 Emphysema, unspecified: Secondary | ICD-10-CM | POA: Diagnosis present

## 2015-01-19 DIAGNOSIS — I824Z2 Acute embolism and thrombosis of unspecified deep veins of left distal lower extremity: Secondary | ICD-10-CM

## 2015-01-19 DIAGNOSIS — I82402 Acute embolism and thrombosis of unspecified deep veins of left lower extremity: Secondary | ICD-10-CM

## 2015-01-19 DIAGNOSIS — K219 Gastro-esophageal reflux disease without esophagitis: Secondary | ICD-10-CM | POA: Diagnosis present

## 2015-01-19 MED ORDER — SODIUM CHLORIDE 0.9 % IV SOLN
250.0000 mL | INTRAVENOUS | Status: DC | PRN
  Administered 2015-01-21: 250 mL via INTRAVENOUS

## 2015-01-19 MED ORDER — WARFARIN SODIUM 5 MG PO TABS
5.0000 mg | ORAL_TABLET | ORAL | Status: AC
Start: 2015-01-19 — End: 2015-01-20
  Administered 2015-01-20: 5 mg via ORAL
  Filled 2015-01-19: qty 1

## 2015-01-19 MED ORDER — HYDROMORPHONE HCL 1 MG/ML IJ SOLN
1.0000 mg | INTRAMUSCULAR | Status: DC | PRN
  Administered 2015-01-20: 2 mg via INTRAVENOUS
  Administered 2015-01-21 (×2): 1 mg via INTRAVENOUS
  Filled 2015-01-19: qty 1
  Filled 2015-01-19: qty 2
  Filled 2015-01-19: qty 1

## 2015-01-19 MED ORDER — LABETALOL HCL 5 MG/ML IV SOLN: 10.0000 mg | INTRAVENOUS | Status: DC | PRN

## 2015-01-19 MED ORDER — HEPARIN BOLUS VIA INFUSION
4000.0000 [IU] | Freq: Once | INTRAVENOUS | Status: DC
  Filled 2015-01-19: qty 4000

## 2015-01-19 MED ORDER — ALUM & MAG HYDROXIDE-SIMETH 200-200-20 MG/5ML PO SUSP: 15.0000 mL | ORAL | Status: DC | PRN

## 2015-01-19 MED ORDER — GUAIFENESIN-DM 100-10 MG/5ML PO SYRP: 15.0000 mL | ORAL_SOLUTION | ORAL | Status: DC | PRN

## 2015-01-19 MED ORDER — POTASSIUM CHLORIDE CRYS ER 20 MEQ PO TBCR
20.0000 meq | EXTENDED_RELEASE_TABLET | Freq: Once | ORAL | Status: AC
  Administered 2015-01-20: 40 meq via ORAL
  Filled 2015-01-19: qty 1

## 2015-01-19 MED ORDER — PHENOL 1.4 % MT LIQD: 1.0000 | OROMUCOSAL | Status: DC | PRN

## 2015-01-19 MED ORDER — HYDRALAZINE HCL 20 MG/ML IJ SOLN: 5.0000 mg | INTRAMUSCULAR | Status: DC | PRN

## 2015-01-19 MED ORDER — SODIUM CHLORIDE 0.9 % IJ SOLN: 3.0000 mL | INTRAMUSCULAR | Status: DC | PRN

## 2015-01-19 MED ORDER — METOPROLOL TARTRATE 1 MG/ML IV SOLN: 2.0000 mg | INTRAVENOUS | Status: DC | PRN

## 2015-01-19 MED ORDER — WARFARIN - PHARMACIST DOSING INPATIENT
Freq: Every day | Status: DC
  Administered 2015-01-22: 18:00:00

## 2015-01-19 MED ORDER — HEPARIN (PORCINE) IN NACL 100-0.45 UNIT/ML-% IJ SOLN: 1050.0000 [IU]/h | INTRAMUSCULAR | Status: DC

## 2015-01-19 MED ORDER — ONDANSETRON HCL 4 MG/2ML IJ SOLN
4.0000 mg | Freq: Four times a day (QID) | INTRAMUSCULAR | Status: DC | PRN
  Administered 2015-01-20 – 2015-01-21 (×2): 4 mg via INTRAVENOUS
  Filled 2015-01-19 (×2): qty 2

## 2015-01-19 MED ORDER — SODIUM CHLORIDE 0.9 % IJ SOLN
3.0000 mL | Freq: Two times a day (BID) | INTRAMUSCULAR | Status: DC
  Administered 2015-01-22: 3 mL via INTRAVENOUS

## 2015-01-19 NOTE — Progress Notes (Signed)
ANTICOAGULATION CONSULT NOTE - Initial Consult  Pharmacy Consult for Heparin and Coumadin Indication: DVT left common femoral vein  Allergies  Allergen Reactions  . Depakote [Divalproex Sodium] Other (See Comments)    Pt gets "delirious"  . Morphine And Related Nausea And Vomiting and Other (See Comments)    "makes me delirious"  . Penicillins Anaphylaxis    Tolerated Zosyn Dec 2014  . Tramadol Nausea And Vomiting  . Imitrex [Sumatriptan] Other (See Comments)    seizures  . Vicodin [Hydrocodone-Acetaminophen] Itching and Rash    Patient Measurements: Height: 5' 1.5" (156.2 cm) Weight: 158 lb 11.7 oz (72 kg) IBW/kg (Calculated) : 48.95 Heparin Dosing Weight: 65 kg  Vital Signs: Temp: 98 F (36.7 C) (08/24 2222) Temp Source: Oral (08/24 2222) BP: 110/84 mmHg (08/24 2222) Pulse Rate: 77 (08/24 2222)  Labs: No results for input(s): HGB, HCT, PLT, APTT, LABPROT, INR, HEPARINUNFRC, CREATININE, CKTOTAL, CKMB, TROPONINI in the last 72 hours.  Estimated Creatinine Clearance: 15.3 mL/min (by C-G formula based on Cr of 4.41).   Medical History: Past Medical History  Diagnosis Date  . Hyperlipidemia   . Alopecia   . Obesity   . Iron deficiency   . RLS (restless legs syndrome)   . Injury of conjunctiva and corneal abrasion of right eye without foreign body   . Cellulitis   . Anemia   . Blood transfusion 2004    "when I had my transplant"  . Migraine   . Hyperparathyroidism, secondary   . Diabetic retinopathy   . Hypertension     takes Metoprolol and Exforge daily  . Depression     takes Klonopin nightly  . Diabetes mellitus type 1     Pt states diagnosed at age 78 with prior episodes of DKA. S/p pancreatic transplant   . GERD (gastroesophageal reflux disease)     takes Protonix daily  . Bronchitis   . Migraine   . Asthma   . Emphysema of lung   . Angina     none in past 2 years.  . Stomach pain     Hx: of chronic  . Pneumonia 2012    "double"  . Dialysis  patient     tues,thurs,sat  . 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  Surgery was done at [facility].     . DKA (diabetic ketoacidoses) 12/01/2014  . Diastolic CHF, acute on chronic 01/02/2015  . Stroke 03/2014    ? TIA  . ESRD (end stage renal disease)     S/p pancreatic and kidney transplant, but back on HD 2008, dialysis at [facility] [place], Sat    Medications:  Awaiting home med reconcilation  Assessment: 44 y.o. female presents with L leg pain and swelling s/p new L thigh AV graft placement 8/19. Duplex scan shows DVT in left common femoral vein. To begin heparin bridge to coumadin. Today is Day #1/minimum 5-day overlap. Baseline CBC and INR pending. Noted pt is ESRD with o/p HD on T/T/S.  Goal of Therapy:  Heparin level 0.3-0.7 units/ml; INR 2-3 Monitor platelets by anticoagulation protocol: Yes   Plan:  Heparin IV bolus 4000 units Heparin gtt at 1050 units/hr Will f/u heparin level in 8 hours Daily heparin level and CBC and INR Coumadin 5mg  po tonight  [name]  [name], PharmD, BCPS Clinical pharmacist, pager [phone] 01/19/2015,11:10 PM

## 2015-01-19 NOTE — Progress Notes (Signed)
[facility] [place]  Patient name: [name] MRN: [id number] DOB: 1971-02-19 Sex: female  REASON FOR ADMISSION: DVT left common femoral vein  HPI: [name] is a 44 y.o. female who is being dialyzed with a graft in her right thigh. This has been very painful however and ultimately  We elected to attempt placement of a new graft in the left thigh so that the right thigh graft would not need to be used ultimately. She underwent placement of a new left thigh AV graft on 01/14/2015 with a 4-7 mm PTFE graft. Of note her saphenous vein was small and I elected to make the venous anastomosis into the femoral vein. The femoral vein had some venous webs and was clearly not normal. There was a small amount of old clot within the femoral vein also noted at the time of surgery.   Since discharge, she's had significant left leg pain and increased swelling in the leg. We brought her in today for a duplex scan which shows a clot in the left common femoral vein. She is admitted now for heparin and leg elevation. The graft is still functioning and the clot appears to be below the anastomosis to the vein.   Past Medical History  Diagnosis Date  . Hyperlipidemia   . Alopecia   . Obesity   . Iron deficiency   . RLS (restless legs syndrome)   . Injury of conjunctiva and corneal abrasion of right eye without foreign body   . Cellulitis   . Anemia   . Blood transfusion 2004    "when I had my transplant"  . Migraine   . Hyperparathyroidism, secondary   . Diabetic retinopathy   . Hypertension     takes Metoprolol and Exforge daily  . Depression     takes Klonopin nightly  . Diabetes mellitus type 1     Pt states diagnosed at age 32 with prior episodes of DKA. S/p pancreatic transplant   . GERD (gastroesophageal reflux disease)     takes Protonix daily  . Bronchitis   . Migraine   . Asthma   . Emphysema of lung   . Angina     none in past 2 years.  . Stomach pain     Hx: of chronic  . Pneumonia 2012    "double"  . Dialysis patient     tues,thurs,sat  . 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  Surgery was done at [facility].     . DKA (diabetic ketoacidoses) 12/01/2014  . Diastolic CHF, acute on chronic 01/02/2015  . Stroke 03/2014    ? TIA  . ESRD (end stage renal disease)     S/p pancreatic and kidney transplant, but back on HD 2008, dialysis at [facility] [place], Sat   Family History  Problem Relation Age of Onset  . Hypertension Mother   . Kidney disease Mother   . Diabetes Mother   . Hypertension Father   . Kidney disease Father   . Diabetes Father    SOCIAL HISTORY: Social History  Substance Use Topics  . Smoking status: Never Smoker   . Smokeless tobacco: Never Used  . Alcohol Use: No   Allergies  Allergen Reactions  . Depakote [Divalproex Sodium] Other (See  Comments)    Pt gets "delirious"  . Morphine And Related Nausea And Vomiting and Other (See Comments)    "makes me delirious"  . Penicillins Anaphylaxis    Tolerated Zosyn Dec 2014  . Tramadol Nausea And Vomiting  . Imitrex [Sumatriptan] Other (See Comments)    seizures  . Vicodin [Hydrocodone-Acetaminophen] Itching and Rash   Current Outpatient Prescriptions  Medication Sig Dispense Refill  . albuterol (PROVENTIL HFA;VENTOLIN HFA) 108 (90 BASE) MCG/ACT inhaler Inhale 2 puffs into the lungs every 4 (four) hours as needed for wheezing or shortness of breath (as per home regimen). 1 Inhaler 0  . amLODipine (NORVASC) 10 MG tablet Take 10 mg by mouth at bedtime.   11  . aspirin 325 MG tablet Take 1 tablet (325 mg total) by mouth daily. 30 tablet 0  . calcium acetate (PHOSLO) 667 MG capsule Take 1 capsule (667 mg total) by mouth 2 (two) times daily. (Patient taking  differently: Take 2,001 mg by mouth 3 (three) times daily with meals. ) 30 capsule 0  . clonazePAM (KLONOPIN) 0.5 MG tablet Take 1 mg by mouth at bedtime.     . Darbepoetin Alfa (ARANESP) 40 MCG/0.4ML SOSY injection Inject 0.4 mLs (40 mcg total) into the vein every Thursday with hemodialysis. 8.4 mL   . doxercalciferol (HECTOROL) 4 MCG/2ML injection Inject 1 mL (2 mcg total) into the vein Every Tuesday,Thursday,and Saturday with dialysis. (Patient not taking: Reported on 01/05/2015) 2 mL   . gabapentin (NEURONTIN) 100 MG capsule Take 100 mg by mouth daily.     Marland Kitchen guaiFENesin (MUCINEX) 600 MG 12 hr tablet Take 1 tablet (600 mg total) by mouth 2 (two) times daily. 30 tablet 0  . insulin glargine (LANTUS) 100 UNIT/ML injection Inject 0.07 mLs (7 Units total) into the skin 2 (two) times daily. 10 mL 11  . insulin lispro (HUMALOG KWIKPEN) 100 UNIT/ML KiwkPen Inject 5-6 Units into the skin 2 (two) times daily. Inject 6 units subcutaneously in the morning and 5 units in the evening    . lidocaine (LIDODERM) 5 % Place 1 patch onto the skin daily. Remove & Discard patch within 12 hours or as directed by MD 10 patch 0  . metoCLOPramide (REGLAN) 10 MG tablet Take 1 tablet (10 mg total) by mouth 4 (four) times daily -  before meals and at bedtime. 60 tablet 0  . metoprolol succinate (TOPROL-XL) 50 MG 24 hr tablet Take 1 tablet (50 mg total) by mouth daily. Take with or immediately following a meal. (Patient taking differently: Take 50 mg by mouth at bedtime. Take with or immediately following a meal.) 30 tablet 1  . montelukast (SINGULAIR) 10 MG tablet Take 10 mg by mouth at bedtime.    . multivitamin (RENA-VIT) TABS tablet Take 1 tablet by mouth daily. 30 tablet 0  . Nutritional Supplements (FEEDING SUPPLEMENT, NEPRO CARB STEADY,) LIQD Take 237 mLs by mouth 2 (two) times daily between meals. 60 Can 0  . oxyCODONE 10 MG TABS Take 1 tablet (10 mg total) by mouth 3 (three) times daily as needed (for pain). 30 tablet 0   . Oxycodone HCl 10 MG TABS Take 1 tablet (10 mg total) by mouth 3 (three) times daily as needed (for pain). 20 tablet 0  . pantoprazole (PROTONIX) 40 MG tablet Take 1 tablet (40 mg total) by mouth at bedtime. (Patient taking differently: Take 40 mg by mouth daily. Takes daily in the afternoon) 30 tablet 0  . promethazine (PHENERGAN) 25 MG  tablet Take 1 tablet (25 mg total) by mouth every 6 (six) hours as needed for nausea or vomiting. 10 tablet 0  . simvastatin (ZOCOR) 20 MG tablet Take 1 tablet (20 mg total) by mouth at bedtime. 30 tablet 0  . zolpidem (AMBIEN) 5 MG tablet Take 5 mg by mouth at bedtime as needed. sleep     No current facility-administered medications for this visit.   REVIEW OF SYSTEMS: Valu.Nieves ] denotes positive finding; [  ] denotes negative finding  CARDIOVASCULAR:  [ ]  chest pain   [ ]  chest pressure   [ ]  palpitations   [ ]  orthopnea   [ ]  dyspnea on exertion   [ ]  claudication   [ ]  rest pain   [ ]  DVT   [ ]  phlebitis PULMONARY:   [ ]  productive cough   [ ]  asthma   [ ]  wheezing NEUROLOGIC:   [ ]  weakness  [ ]  paresthesias  [ ]  aphasia  [ ]  amaurosis  [ ]  dizziness HEMATOLOGIC:   [ ]  bleeding problems   [ ]  clotting disorders MUSCULOSKELETAL:  [ ]  joint pain   [ ]  joint swelling Valu.Nieves ] leg swelling GASTROINTESTINAL: [ ]   blood in stool  [ ]   hematemesis GENITOURINARY:  [ ]   dysuria  [ ]   hematuria PSYCHIATRIC:  [ ]  history of major depression INTEGUMENTARY:  [ ]  rashes  [ ]  ulcers CONSTITUTIONAL:  [ ]  fever   [ ]  chills  PHYSICAL EXAM: There were no vitals filed for this visit. GENERAL: The patient is a well-nourished female, in no acute distress. The vital signs are documented above. CARDIAC: There is a regular rate and rhythm.  VASCULAR: both feet are warm and well perfused. She has moderate left lower extremity swelling her left thigh AV graft has a good thrill and bruit. PULMONARY: There is good air exchange bilaterally without wheezing or rales. ABDOMEN: Soft and  non-tender with normal pitched bowel sounds.  MUSCULOSKELETAL: There are no major deformities or cyanosis. NEUROLOGIC: No focal weakness or paresthesias are detected. SKIN: There are no ulcers or rashes noted. PSYCHIATRIC: The patient has a normal affect.  DATA:  VENOUS DUPLEX: This shows that her left thigh AV graft is patent with good flow. However, there is a partially occlusive thrombus in the common femoral vein below the femoral anastomosis.  MEDICAL ISSUES:  DVT LEFT COMMON FEMORAL VEIN: The patient will be admitted for intravenous heparin and leg elevation. She will require Coumadin therapy. We'll have to arrange for her dialysis in the hospital using her right thigh graft. The left thigh graft is still functioning with a good thrill.  If her symptoms worsen we would have to consider ligation of her graft or removal.   Deitra Mayo

## 2015-01-19 NOTE — Progress Notes (Signed)
Patient has arrived on [address] [place], MD on call was notified of patients arrival, VSS, patient currently c/o left leg pain, MD gave verbal order for dilauded IV 1-2 mg Q4H PRN, orders followed through. Patient is a difficult stick, IV team consulted and currently are attempting to put an IV in. RN will continue to monitor.  Sharene Skeans, RN

## 2015-01-19 NOTE — Telephone Encounter (Signed)
Phone call from pt.  Stated she has worsening of swelling in left leg, from foot to groin.  Stated the pain has become " unbearable."  Stated the pain has gotten worse everyday, since surgery.  Reported fever on Sunday of 101 degrees.  Stated "it feels like there is heat inside my leg."    Stated the swelling is worse in the thigh area, compared to the lower leg.  Reported "some redness" in incisional area.  Stated the incision is intact.  Denied any drainage from incision.  Reported symptoms of pain and swelling to Dr. Scot Dock.  Recommended to bring in today for left LE venous duplex and office appt.  Pt. Notified of appt.  Agrees with plan.

## 2015-01-20 ENCOUNTER — Inpatient Hospital Stay (HOSPITAL_COMMUNITY): Payer: Medicare Other

## 2015-01-20 ENCOUNTER — Encounter (HOSPITAL_COMMUNITY): Payer: Self-pay

## 2015-01-20 LAB — CBC
HEMATOCRIT: 30.7 % — AB (ref 36.0–46.0)
Hemoglobin: 9.6 g/dL — ABNORMAL LOW (ref 12.0–15.0)
MCH: 31.1 pg (ref 26.0–34.0)
MCHC: 31.3 g/dL (ref 30.0–36.0)
MCV: 99.4 fL (ref 78.0–100.0)
PLATELETS: 164 10*3/uL (ref 150–400)
RBC: 3.09 MIL/uL — ABNORMAL LOW (ref 3.87–5.11)
RDW: 15.5 % (ref 11.5–15.5)
WBC: 7 10*3/uL (ref 4.0–10.5)

## 2015-01-20 LAB — GLUCOSE, CAPILLARY
GLUCOSE-CAPILLARY: 568 mg/dL — AB (ref 65–99)
Glucose-Capillary: >600 mg/dL (ref 65–99)

## 2015-01-20 LAB — PROTIME-INR
INR: 1.58 — ABNORMAL HIGH (ref 0.00–1.49)
Prothrombin Time: 18.9 seconds — ABNORMAL HIGH (ref 11.6–15.2)

## 2015-01-20 LAB — GLUCOSE, RANDOM: Glucose, Bld: 725 mg/dL (ref 65–99)

## 2015-01-20 MED ORDER — DIPHENHYDRAMINE HCL 25 MG PO CAPS
25.0000 mg | ORAL_CAPSULE | Freq: Four times a day (QID) | ORAL | Status: DC | PRN
  Administered 2015-01-20: 25 mg via ORAL
  Filled 2015-01-20: qty 1

## 2015-01-20 MED ORDER — INSULIN GLARGINE 100 UNIT/ML ~~LOC~~ SOLN
7.0000 [IU] | Freq: Two times a day (BID) | SUBCUTANEOUS | Status: DC
  Administered 2015-01-20 – 2015-01-22 (×5): 7 [IU] via SUBCUTANEOUS
  Filled 2015-01-20 (×7): qty 0.07

## 2015-01-20 MED ORDER — GABAPENTIN 100 MG PO CAPS
100.0000 mg | ORAL_CAPSULE | Freq: Every day | ORAL | Status: DC
  Administered 2015-01-20 – 2015-01-22 (×3): 100 mg via ORAL
  Filled 2015-01-20 (×4): qty 1

## 2015-01-20 MED ORDER — METOCLOPRAMIDE HCL 10 MG PO TABS
10.0000 mg | ORAL_TABLET | Freq: Three times a day (TID) | ORAL | Status: DC
  Administered 2015-01-21 – 2015-01-23 (×8): 10 mg via ORAL
  Filled 2015-01-20: qty 1
  Filled 2015-01-20: qty 2
  Filled 2015-01-20 (×8): qty 1
  Filled 2015-01-20: qty 2
  Filled 2015-01-20 (×2): qty 1

## 2015-01-20 MED ORDER — OXYCODONE-ACETAMINOPHEN 5-325 MG PO TABS
1.0000 | ORAL_TABLET | Freq: Four times a day (QID) | ORAL | Status: DC | PRN
  Administered 2015-01-20: 1 via ORAL
  Administered 2015-01-22: 2 via ORAL
  Filled 2015-01-20: qty 1
  Filled 2015-01-20: qty 2

## 2015-01-20 MED ORDER — DOXERCALCIFEROL 2.5 MCG PO CAPS
3.0000 ug | ORAL_CAPSULE | ORAL | Status: DC
  Filled 2015-01-20 (×2): qty 1

## 2015-01-20 MED ORDER — HEPARIN BOLUS VIA INFUSION
4000.0000 [IU] | Freq: Once | INTRAVENOUS | Status: AC
  Administered 2015-01-20: 4000 [IU] via INTRAVENOUS
  Filled 2015-01-20: qty 4000

## 2015-01-20 MED ORDER — PROMETHAZINE HCL 25 MG/ML IJ SOLN: 12.5000 mg | Freq: Four times a day (QID) | INTRAMUSCULAR | Status: DC | PRN

## 2015-01-20 MED ORDER — AMLODIPINE BESYLATE 10 MG PO TABS: 10.0000 mg | ORAL_TABLET | Freq: Every day | ORAL | Status: DC

## 2015-01-20 MED ORDER — WARFARIN SODIUM 5 MG PO TABS
5.0000 mg | ORAL_TABLET | Freq: Once | ORAL | Status: AC
  Administered 2015-01-20: 5 mg via ORAL
  Filled 2015-01-20: qty 1

## 2015-01-20 MED ORDER — INSULIN ASPART 100 UNIT/ML ~~LOC~~ SOLN
12.0000 [IU] | Freq: Once | SUBCUTANEOUS | Status: AC
  Administered 2015-01-20: 12 [IU] via SUBCUTANEOUS

## 2015-01-20 MED ORDER — ZOLPIDEM TARTRATE 5 MG PO TABS: 5.0000 mg | ORAL_TABLET | Freq: Every evening | ORAL | Status: DC | PRN

## 2015-01-20 MED ORDER — PROMETHAZINE HCL 25 MG PO TABS
25.0000 mg | ORAL_TABLET | Freq: Four times a day (QID) | ORAL | Status: DC | PRN
  Administered 2015-01-20: 25 mg via ORAL
  Filled 2015-01-20: qty 1

## 2015-01-20 MED ORDER — MONTELUKAST SODIUM 10 MG PO TABS
10.0000 mg | ORAL_TABLET | Freq: Every day | ORAL | Status: DC
  Administered 2015-01-21 – 2015-01-22 (×3): 10 mg via ORAL
  Filled 2015-01-20 (×4): qty 1

## 2015-01-20 MED ORDER — SODIUM CHLORIDE 0.9 % IJ SOLN
10.0000 mL | INTRAMUSCULAR | Status: DC | PRN
  Administered 2015-01-20: 20 mL
  Administered 2015-01-21: 10 mL
  Filled 2015-01-20: qty 40

## 2015-01-20 MED ORDER — ALBUTEROL SULFATE (2.5 MG/3ML) 0.083% IN NEBU: 3.0000 mL | INHALATION_SOLUTION | RESPIRATORY_TRACT | Status: DC | PRN

## 2015-01-20 MED ORDER — CLONAZEPAM 1 MG PO TABS
1.0000 mg | ORAL_TABLET | Freq: Every day | ORAL | Status: DC
  Administered 2015-01-21 – 2015-01-22 (×3): 1 mg via ORAL
  Filled 2015-01-20 (×3): qty 1

## 2015-01-20 MED ORDER — INSULIN ASPART 100 UNIT/ML ~~LOC~~ SOLN
0.0000 [IU] | Freq: Three times a day (TID) | SUBCUTANEOUS | Status: DC
  Administered 2015-01-21: 1 [IU] via SUBCUTANEOUS
  Administered 2015-01-21: 2 [IU] via SUBCUTANEOUS

## 2015-01-20 MED ORDER — SODIUM CHLORIDE 0.9 % IV SOLN
125.0000 mg | INTRAVENOUS | Status: DC
  Filled 2015-01-20: qty 10

## 2015-01-20 MED ORDER — LIDOCAINE HCL 1 % IJ SOLN
INTRAMUSCULAR | Status: AC
  Filled 2015-01-20: qty 20

## 2015-01-20 MED ORDER — METOPROLOL SUCCINATE ER 50 MG PO TB24: 50.0000 mg | ORAL_TABLET | Freq: Every day | ORAL | Status: DC

## 2015-01-20 MED ORDER — ALBUMIN HUMAN 25 % IV SOLN
25.0000 g | Freq: Once | INTRAVENOUS | Status: AC
  Administered 2015-01-20: 25 g via INTRAVENOUS

## 2015-01-20 MED ORDER — INSULIN ASPART 100 UNIT/ML ~~LOC~~ SOLN
18.0000 [IU] | Freq: Once | SUBCUTANEOUS | Status: AC
  Administered 2015-01-20: 18 [IU] via SUBCUTANEOUS

## 2015-01-20 MED ORDER — SIMVASTATIN 20 MG PO TABS
20.0000 mg | ORAL_TABLET | Freq: Every day | ORAL | Status: DC
  Administered 2015-01-21 – 2015-01-22 (×2): 20 mg via ORAL
  Filled 2015-01-20 (×3): qty 1

## 2015-01-20 MED ORDER — ALBUMIN HUMAN 25 % IV SOLN
INTRAVENOUS | Status: AC
  Administered 2015-01-20: 25 g via INTRAVENOUS
  Filled 2015-01-20: qty 100

## 2015-01-20 MED ORDER — RENA-VITE PO TABS
1.0000 | ORAL_TABLET | Freq: Every day | ORAL | Status: DC
  Administered 2015-01-21 – 2015-01-22 (×3): 1 via ORAL
  Filled 2015-01-20 (×4): qty 1

## 2015-01-20 MED ORDER — CALCIUM ACETATE (PHOS BINDER) 667 MG PO CAPS
667.0000 mg | ORAL_CAPSULE | Freq: Three times a day (TID) | ORAL | Status: DC
  Administered 2015-01-21 – 2015-01-23 (×7): 667 mg via ORAL
  Filled 2015-01-20 (×13): qty 1

## 2015-01-20 MED ORDER — DARBEPOETIN ALFA 150 MCG/0.3ML IJ SOSY
150.0000 ug | PREFILLED_SYRINGE | INTRAMUSCULAR | Status: DC
  Filled 2015-01-20: qty 0.3

## 2015-01-20 MED ORDER — HEPARIN (PORCINE) IN NACL 100-0.45 UNIT/ML-% IJ SOLN
1050.0000 [IU]/h | INTRAMUSCULAR | Status: DC
  Administered 2015-01-20 – 2015-01-22 (×3): 1050 [IU]/h via INTRAVENOUS
  Filled 2015-01-20 (×5): qty 250

## 2015-01-20 MED ORDER — ASPIRIN 325 MG PO TABS
325.0000 mg | ORAL_TABLET | Freq: Every day | ORAL | Status: DC
  Administered 2015-01-20 – 2015-01-22 (×3): 325 mg via ORAL
  Filled 2015-01-20 (×4): qty 1

## 2015-01-20 NOTE — Progress Notes (Signed)
Pt had PICC line order placed by Trinh, PA. IV nurse not available to place picc. RN notified Dr. Scot Dock and new order for line to be placed in interventional radiology.   Will continue to monitor.   Earlie Lou

## 2015-01-20 NOTE — Progress Notes (Signed)
ANTICOAGULATION CONSULT NOTE -Follow-up  Pharmacy Consult for Heparin and Coumadin Indication: DVT left common femoral vein  Allergies  Allergen Reactions  . Depakote [Divalproex Sodium] Other (See Comments)    Pt gets "delirious"  . Morphine And Related Nausea And Vomiting and Other (See Comments)    "makes me delirious"  . Penicillins Anaphylaxis    Tolerated Zosyn Dec 2014  . Tramadol Nausea And Vomiting  . Imitrex [Sumatriptan] Other (See Comments)    seizures  . Vicodin [Hydrocodone-Acetaminophen] Itching and Rash   Labs: No results for input(s): HGB, HCT, PLT, APTT, LABPROT, INR, HEPARINUNFRC, CREATININE, CKTOTAL, CKMB, TROPONINI in the last 72 hours.  Estimated Creatinine Clearance: 15.3 mL/min (by C-G formula based on Cr of 4.41).   Medical History: Past Medical History  Diagnosis Date  . Hyperlipidemia   . Alopecia   . Obesity   . Iron deficiency   . RLS (restless legs syndrome)   . Injury of conjunctiva and corneal abrasion of right eye without foreign body   . Cellulitis   . Anemia   . Blood transfusion 2004    "when I had my transplant"  . Migraine   . Hyperparathyroidism, secondary   . Diabetic retinopathy   . Hypertension     takes Metoprolol and Exforge daily  . Depression     takes Klonopin nightly  . Diabetes mellitus type 1     Pt states diagnosed at age 72 with prior episodes of DKA. S/p pancreatic transplant   . GERD (gastroesophageal reflux disease)     takes Protonix daily  . Bronchitis   . Migraine   . Asthma   . Emphysema of lung   . Angina     none in past 2 years.  . Stomach pain     Hx: of chronic  . Pneumonia 2012    "double"  . Dialysis patient     tues,thurs,sat  . 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  Surgery was done  at [facility].     . DKA (diabetic ketoacidoses) 12/01/2014  . Diastolic CHF, acute on chronic 01/02/2015  . Stroke 03/2014    ? TIA  . ESRD (end stage renal disease)     S/p pancreatic and kidney transplant, but back on HD 2008, dialysis at [facility] [place], Sat     Assessment: 44 y.o. female presents with L leg pain and swelling s/p new L thigh AV graft placement 8/19. Duplex scan shows DVT in left common femoral vein.   Have been unable to establish IV access, patient currently on her way to IR for IV access establishment Will re-time heparin to begin at 1600 pm   Goal of Therapy:  Heparin level 0.3-0.7 units/ml; INR 2-3 Monitor platelets by anticoagulation protocol: Yes   Plan:  Heparin IV bolus 4000 units Heparin gtt at 1050 units/hr Will f/u heparin level in 8 hours after heparin starts Daily heparin level and CBC and INR Repeat Coumadin 5mg  po tonight  Thank you Anette Guarneri, PharmD [phone]  01/20/2015,3:00 PM

## 2015-01-20 NOTE — Progress Notes (Signed)
Pt not allowing any sticks on her upper arms. Pt states her foot is a good place to draw blood. Pt going to IR to get central line placed, but will have heparin started. Heparin level cannot be drawn from the same catheter. [name], PA gave orders to take lab draws from the foot.    Will continue to monitor.   [name]

## 2015-01-20 NOTE — Progress Notes (Signed)
IV team attempted 3 sticks on the patient and was unsuccessful. RN notified MD on call, MD gave orders to re evaluate tomorrow with Dr. Scot Dock for a central line and to start lovenox for tonight. Patient refused lovenox, patient stated, "it made my aunt develop tumors". RN educated and re educated patient on the risk of developing a PE, patient continues to refuse and states she is aware of the risks. Patient received 5 mg dose of coumadin tonight. RN will continue to monitor.   Sharene Skeans, RN

## 2015-01-20 NOTE — Procedures (Signed)
Interventional Radiology Procedure Note  Procedure: Placement of a right EJ approach triple lumen catheter.  18cm length.   Findings:  Right IJ occluded. .  Complications: None Recommendations:  - Ok to shower tomorrow - Do not submerge for 7 days - Routine line care   Signed,  [name]. [name], DO

## 2015-01-20 NOTE — Progress Notes (Signed)
Dr. Scot Dock gave orders to draw heparin level from central line even though potential for skewing results. No other iv access.   Will continue to monitor.   Earlie Lou

## 2015-01-20 NOTE — Progress Notes (Signed)
CRITICAL VALUE ALERT  Critical value received:CBG 725  Date of notification:  01/20/2015  Time of notification:  1900  Critical value read back:yes  Nurse who received alert:  Adaku  MD notified (1st page): Tawny Asal   Time MD responded:  1910  Dr. Scot Dock gave orders for one time dose of 18 units novolog insulin now. Phenergran IV 12.5 mg q 6 hours and to resume sensitive scale insulin. RN reported pt condition.   Will continue to monitor.   Earlie Lou

## 2015-01-20 NOTE — Progress Notes (Addendum)
  [facility] Progress Note  HPI: Patient presented to the [facility] office yesterday as an add on for progressive swelling of left leg from left knee to groin. A venous duplex was performed revealing left groin DVT. She is s/p left thigh AV graft 01/14/2015 by Dr. Scot Dock. A new graft was placed due to prolonged bleeding from right thigh AV graft.  She is currently dialyzing TTS via her right thigh graft. She was admitted yesterday to be started on heparin and coumadin.   This morning she continues to complain of pain in her right thigh.   Objective Filed Vitals:   01/20/15 0524  BP: 115/49  Pulse:   Temp: 97.6 F (36.4 C)  Resp: 18   No intake or output data in the 24 hours ending 01/20/15 0756  Unable to examine left leg secondary to patient's pain. She was pushing my hand away during the exam.   Assessment/Planning: 44 y.o. female with left femoral DVT s/p left thigh graft 01/14/2015  Unable to obtain peripheral IV yesterday, therefore unable to start heparin. Patient also refused lovenox. Coumadin was started yesterday. Will ask Dr. Scot Dock about central line placement. Nephrology consulted for HD (TTS).  Continue to elevate leg above level of the heart. Have patient flat on her back with legs above the heart and knees slightly bent.    Alvia Grove 01/20/2015 7:56 AM --  Laboratory CBC    Component Value Date/Time   WBC 5.3 01/15/2015 0909   HGB 9.9* 01/15/2015 0909   HCT 31.9* 01/15/2015 0909   PLT 177 01/15/2015 0909    BMET    Component Value Date/Time   NA 132* 01/15/2015 0908   K 3.3* 01/15/2015 0908   CL 97* 01/15/2015 0908   CO2 26 01/15/2015 0908   GLUCOSE 87 01/15/2015 0908   BUN 15 01/15/2015 0908   CREATININE 4.41* 01/15/2015 0908   CALCIUM 8.1* 01/15/2015 0908   CALCIUM 8.4 03/06/2011 0751   GFRNONAA 11* 01/15/2015 0908   GFRAA 13* 01/15/2015 0908    COAG Lab Results  Component Value Date   INR 1.06 01/27/2010   INR 1.0  12/26/2006   No results found for: PTT  Antibiotics Anti-infectives    None       Virgina Jock, PA-C [facility] Office: [phone] Pager: [phone] 01/20/2015 7:56 AM  Agree with above.  Graft with good thrill  She will need a PIC line for heparin.  Deitra Mayo, MD, [place] [phone] Office: [phone]

## 2015-01-20 NOTE — Progress Notes (Signed)
Pt reported to RN that she had not had her blood sugar checked. NT obtained cbg that read "too high to count". Notified IV team and lab concerning stat cbg stick. Will notify dr. Scot Dock with results for further orders. Pt is nauseous and vomiting. Prn meds given Will continue to monitor.   Earlie Lou

## 2015-01-20 NOTE — Progress Notes (Signed)
Pt is on Tuesday, Thursday, Saturday dialysis. No orders for dialysis noted. Paged PA Virgina Jock to get orders. Need a central line also per night RN.  Pt refuses labs until hemo dialysis. She states she is a hard stick.   Will continue monitor  Earlie Lou   Paged PA Virgina Jock again at [phone].  Trinh PA said she has consulted nephrology for dialysis order and she will follow up with Dr. Scot Dock concerning central line.

## 2015-01-20 NOTE — Progress Notes (Signed)
Subjective:  Dc  5 days  Ago after new Left Fem AVGG ( 8 /19/16 inserted  )and now admitted back with Left   Lower extremity DVT. Co of some R groin to lower leg discomfort. Last HD Tuesday at [facility] using R  Fem avgg without problems reported .    Objective Vital signs in last 24 hours: Filed Vitals:   01/19/15 2222 01/20/15 0201 01/20/15 0524  BP: 110/84 101/47 115/49  Pulse: 77    Temp: 98 F (36.7 C) 97.7 F (36.5 C) 97.6 F (36.4 C)  TempSrc: Oral Oral Oral  Resp: 18  18  Height: 5' 1.5" (1.562 m)    Weight: 72 kg (158 lb 11.7 oz)    SpO2: 100% 95% 100%   Weight change:    Physical Exam General: alert , nad  AA female, cooperative Heart:  RRR  Soft 1/6 sem lsb , No/G/R Lungs: Bilateral breath sounds slightly decreased R base.otherwise CTA Abdomen:soft nontender active BS Extremities: mild  R lower extrem .edema groin to mid lower leg , no ankle edema bilat. Dialysis Access: R thigh AVG + thrill + bruit. / L fem Avvg Pos. With swelling    OP HD= TTS  NW  3h 45 min edw=  67.5 kg 2k, 2.5 ca  bath R thigh AVG/ SP new L fem avgg 01/14/15 no heparin Hect 3 / Venofer 50 / Mircera 100 q 2wks ( start 01/20/15)   OP labs = Last hgb 9.8 ca 8.8 phos 4.2  pth 163  Problem/Plan: 1.  L groin DVT- per VVS . Heparin iv once iv  established  2. ESRD - HD today on schedule no heparin / ck labs pre use 3kbath  3. Anemia -  esa and fe   4. Secondary hyperparathyroidism - hectorol iv q hd / phos 2.8  Ca corec 8.8  phoslo 2 decr to 1ac . 5. HTN/volume - wt up 6.5 kg per Bed wt - uf 4 to 4.5 on hd as bp allows 6. DM type 1  [name] Haber, PA-C [facility] Beeper [phone] 01/20/2015,12:55 PM  LOS: 1 day   Pt seen, examined and agree w A/P as above. ESRD patient recent L thigh AVG placement due to a failing R thigh AVG. Now has LLE DVT. Started on heparin and coumadin. Plan HD tomorrow.  [name] Splinter MD pager [phone]    cell [phone] 01/20/2015, 2:57  PM    Labs: Basic Metabolic Panel:  Recent Labs Lab 01/14/15 2059 01/15/15 0817 01/15/15 0908  NA 131* 130* 132*  K 5.3* 4.4 3.3*  CL 95* 94* 97*  CO2 23 22 26   GLUCOSE 369* 59* 87  BUN 22* 28* 15  CREATININE 6.51* 7.54* 4.41*  CALCIUM 8.3* 8.5* 8.1*  PHOS 5.0*  --  2.8   Liver Function Tests:  Recent Labs Lab 01/14/15 2059 01/15/15 0908  ALBUMIN 3.2* 3.0*   No results for input(s): LIPASE, AMYLASE in the last 168 hours. No results for input(s): AMMONIA in the last 168 hours. CBC:  Recent Labs Lab 01/14/15 2059 01/15/15 0817 01/15/15 0909  WBC 8.2 6.0 5.3  NEUTROABS  --   --  3.4  HGB 11.2* 10.0* 9.9*  HCT 35.4* 31.5* 31.9*  MCV 99.7 96.9 97.0  PLT 208 187 177   Cardiac Enzymes: No results for input(s): CKTOTAL, CKMB, CKMBINDEX, TROPONINI in the last 168 hours. CBG:  Recent Labs Lab 01/14/15 1333 01/14/15 2125 01/15/15 0722 01/15/15 0824 01/15/15 1248  GLUCAP 279* 404* 50* 74 127*    Studies/Results: No results found. Medications: . heparin Stopped (01/19/15 2330)   . heparin  4,000 Units Intravenous Once  . sodium chloride  3 mL Intravenous Q12H  . Warfarin - Pharmacist Dosing Inpatient   Does not apply [phone]

## 2015-01-21 LAB — HEPARIN LEVEL (UNFRACTIONATED)
HEPARIN UNFRACTIONATED: 0.69 [IU]/mL (ref 0.30–0.70)
Heparin Unfractionated: 0.7 IU/mL (ref 0.30–0.70)

## 2015-01-21 LAB — CBC
HEMATOCRIT: 27.8 % — AB (ref 36.0–46.0)
HEMOGLOBIN: 8.8 g/dL — AB (ref 12.0–15.0)
MCH: 30.8 pg (ref 26.0–34.0)
MCHC: 31.7 g/dL (ref 30.0–36.0)
MCV: 97.2 fL (ref 78.0–100.0)
Platelets: 157 10*3/uL (ref 150–400)
RBC: 2.86 MIL/uL — ABNORMAL LOW (ref 3.87–5.11)
RDW: 15.2 % (ref 11.5–15.5)
WBC: 6.6 10*3/uL (ref 4.0–10.5)

## 2015-01-21 LAB — GLUCOSE, CAPILLARY
GLUCOSE-CAPILLARY: 111 mg/dL — AB (ref 65–99)
GLUCOSE-CAPILLARY: 98 mg/dL (ref 65–99)
GLUCOSE-CAPILLARY: 169 mg/dL — AB (ref 65–99)
Glucose-Capillary: 246 mg/dL — ABNORMAL HIGH (ref 65–99)
Glucose-Capillary: 82 mg/dL (ref 65–99)
Glucose-Capillary: 102 mg/dL — ABNORMAL HIGH (ref 65–99)
Glucose-Capillary: 128 mg/dL — ABNORMAL HIGH (ref 65–99)

## 2015-01-21 LAB — PROTIME-INR
INR: 1.43 (ref 0.00–1.49)
PROTHROMBIN TIME: 17.5 s — AB (ref 11.6–15.2)

## 2015-01-21 MED ORDER — NEPRO/CARBSTEADY PO LIQD
237.0000 mL | ORAL | Status: DC | PRN
  Filled 2015-01-21: qty 237

## 2015-01-21 MED ORDER — WARFARIN SODIUM 7.5 MG PO TABS
7.5000 mg | ORAL_TABLET | Freq: Once | ORAL | Status: AC
  Administered 2015-01-21: 7.5 mg via ORAL
  Filled 2015-01-21: qty 1

## 2015-01-21 MED ORDER — LIDOCAINE-PRILOCAINE 2.5-2.5 % EX CREA
1.0000 "application " | TOPICAL_CREAM | CUTANEOUS | Status: DC | PRN
  Filled 2015-01-21: qty 5

## 2015-01-21 MED ORDER — ALTEPLASE 2 MG IJ SOLR
2.0000 mg | Freq: Once | INTRAMUSCULAR | Status: DC | PRN
  Filled 2015-01-21: qty 2

## 2015-01-21 MED ORDER — COUMADIN BOOK
Freq: Once | Status: AC
  Administered 2015-01-21: 16:00:00
  Filled 2015-01-21: qty 1

## 2015-01-21 MED ORDER — HEPARIN SODIUM (PORCINE) 1000 UNIT/ML DIALYSIS: 1000.0000 [IU] | INTRAMUSCULAR | Status: DC | PRN

## 2015-01-21 MED ORDER — SODIUM CHLORIDE 0.9 % IV SOLN: 100.0000 mL | INTRAVENOUS | Status: DC | PRN

## 2015-01-21 MED ORDER — PENTAFLUOROPROP-TETRAFLUOROETH EX AERO: 1.0000 "application " | INHALATION_SPRAY | CUTANEOUS | Status: DC | PRN

## 2015-01-21 MED ORDER — LIDOCAINE HCL (PF) 1 % IJ SOLN: 5.0000 mL | INTRAMUSCULAR | Status: DC | PRN

## 2015-01-21 MED ORDER — WARFARIN VIDEO: Freq: Once | Status: DC

## 2015-01-21 NOTE — Progress Notes (Signed)
Subjective:  Doing well, had syncopal episode in HD last night related to low BP's  Objective Vital signs in last 24 hours: Filed Vitals:   01/21/15 0000 01/21/15 0030 01/21/15 0121 01/21/15 0522  BP: 100/56 115/33 130/69 111/57  Pulse: 66 69 69 72  Temp:   99.5 F (37.5 C) 97.5 F (36.4 C)  TempSrc:   Oral Oral  Resp:   16 16  Height:      Weight:    73.7 kg (162 lb 7.7 oz)  SpO2:   100%    Weight change: -0.5 kg (-1 lb 1.6 oz)   Physical Exam General: alert , nad  AA female, cooperative Heart:  RRR  Soft 1/6 sem lsb , No/G/R Lungs: Bilateral breath sounds slightly decreased R base.otherwise CTA Abdomen:soft nontender active BS Extremities: mild  R lower extrem .edema groin to mid lower leg , no ankle edema bilat. Dialysis Access: R thigh AVG + thrill + bruit. / L fem Avvg Pos. With swelling    OP HD= TTS  NW  3h 45 min edw=  67.5 kg 2k, 2.5 ca  bath R thigh AVG/ SP new L fem avgg 01/14/15 no heparin Hect 3 / Venofer 50 / Mircera 100 q 2wks ( start 01/20/15)   OP labs = Last hgb 9.8 ca 8.8 phos 4.2  pth 163  Problem/Plan: 1.  L groin DVT- per VVS 2. ESRD - HD tomorrow, hoyer wt 3. Anemia -  esa and fe   4. Secondary hyperparathyroidism - hectorol iv q hd / phos 2.8  Ca corec 8.8  phoslo 2 decr to 1ac. 5. HTN - BP's low, hold norvasc and MTP for now 6. Volume - bed wt's not accurate, no vol excess on exam and passed out with 4kg goal yesterday 7. DM type 1   Kelly Splinter MD pager [phone]    cell [phone] 01/21/2015, 9:54 AM    Labs: Basic Metabolic Panel:  Recent Labs Lab 01/14/15 2059 01/15/15 0817 01/15/15 0908 01/20/15 1834  NA 131* 130* 132*  --   K 5.3* 4.4 3.3*  --   CL 95* 94* 97*  --   CO2 23 22 26   --   GLUCOSE 369* 59* 87 725*  BUN 22* 28* 15  --   CREATININE 6.51* 7.54* 4.41*  --   CALCIUM 8.3* 8.5* 8.1*  --   PHOS 5.0*  --  2.8  --    Liver Function Tests:  Recent Labs Lab 01/14/15 2059 01/15/15 0908  ALBUMIN 3.2* 3.0*   No  results for input(s): LIPASE, AMYLASE in the last 168 hours. No results for input(s): AMMONIA in the last 168 hours. CBC:  Recent Labs Lab 01/14/15 2059 01/15/15 0817 01/15/15 0909 01/20/15 1752 01/21/15 0245  WBC 8.2 6.0 5.3 7.0 6.6  NEUTROABS  --   --  3.4  --   --   HGB 11.2* 10.0* 9.9* 9.6* 8.8*  HCT 35.4* 31.5* 31.9* 30.7* 27.8*  MCV 99.7 96.9 97.0 99.4 97.2  PLT 208 187 177 164 157   Cardiac Enzymes: No results for input(s): CKTOTAL, CKMB, CKMBINDEX, TROPONINI in the last 168 hours. CBG:  Recent Labs Lab 01/20/15 2330 01/21/15 0340 01/21/15 0526 01/21/15 0625 01/21/15 0835  GLUCAP 246* 111* 98 82 102*    Studies/Results: Ir Fluoro Guide Cv Line Right  01/21/2015   ADDENDUM REPORT: 01/21/2015 09:07  ADDENDUM: Addendum is created to address findings of the study.  FINDINGS: Upon completion of right external jugular  central venous catheter placement, tip of the catheter present at the superior cavoatrial junction.  Catheter ready for use.  Signed,  [name]. [name], DO  [facility]  [facility]   Electronically Signed   By: Corrie Mckusick D.O.   On: 01/21/2015 09:07   01/21/2015   CLINICAL DATA:  44 year old female with a history of renal failure.  She has been referred for placement of central venous catheter, as she is requiring treatment for DVT.  EXAM: IR RIGHT FLOURO GUIDE CV LINE; IR ULTRASOUND GUIDANCE VASC ACCESS RIGHT  Date: 01/20/2015  ANESTHESIA/SEDATION: None  FLUOROSCOPY TIME:  1 minutes 0 seconds  TECHNIQUE: The right neck and chest was prepped with chlorhexidine, and draped in the usual sterile fashion using maximum barrier technique (cap and mask, sterile gown, sterile gloves, large sterile sheet, hand hygiene and cutaneous antiseptic). Local anesthesia was attained by infiltration with 1% lidocaine with epinephrine.  Ultrasound demonstrated patency of the right internal jugular vein, at the level of the  sternocleidomastoid. Under real-time ultrasound guidance, this vein was accessed with a 21 gauge micropuncture needle and image documentation was performed. 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  Ultrasound survey of the right external jugular vein was then performed. The skin and subcutaneous tissues were infiltrated 1% lidocaine overlying the right external jugular vein. Micropuncture set was then used to puncture the right external jugular vein. The micro wire passed easily into the IVC under fluoroscopy.  A small incision was made, and the peel-away sheath was placed over the wire. The wire was removed to estimate internal length of the catheter, which was estimated 18 cm.  A triple-lumen 5 French catheter was then cut at 18 cm in passed through the peel-away sheath.  Peel-away sheath was removed, final image was stored, and the catheter was sutured at the neck.  Patient tolerated the procedure well and remained hemodynamically stable throughout.  No complications were encountered and no significant blood loss was encountered.  .  COMPLICATIONS: None  IMPRESSION: Status post placement of triple-lumen central catheter, via right external jugular vein approach. Catheter ready for use.  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  Signed,  [name]. [name], DO  [facility]  [facility]  Electronically Signed: By: Corrie Mckusick D.O. On: 01/20/2015 16:55   Ir US Guide Vasc Access Right  01/21/2015   ADDENDUM REPORT: 01/21/2015 09:07  ADDENDUM: Addendum is created to address findings of the study.  FINDINGS: Upon completion of right external jugular central venous catheter placement, tip of the catheter present at the superior cavoatrial junction.  Catheter ready for use.  Signed,  [name]. [name], DO  [facility]  [facility]   Electronically Signed   By: Corrie Mckusick D.O.   On: 01/21/2015 09:07   01/21/2015   CLINICAL DATA:  44 year old female with a history of renal failure.  She has been referred for placement of central venous catheter, as she is requiring treatment for DVT.  EXAM: IR RIGHT FLOURO GUIDE CV LINE; IR ULTRASOUND GUIDANCE VASC ACCESS RIGHT  Date: 01/20/2015  ANESTHESIA/SEDATION: None  FLUOROSCOPY TIME:  1 minutes 0 seconds  TECHNIQUE: The right neck and chest was prepped with chlorhexidine, and draped in the usual sterile fashion using maximum barrier technique (cap and mask, sterile gown, sterile gloves, large  sterile sheet, hand hygiene and cutaneous antiseptic). Local anesthesia was attained by infiltration with 1% lidocaine with epinephrine.  Ultrasound demonstrated patency of the right internal jugular vein, at the level of the sternocleidomastoid. Under real-time ultrasound guidance, this vein was accessed with a 21 gauge micropuncture needle and image documentation was performed. 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  Ultrasound survey of the right external jugular vein was then performed. The skin and subcutaneous tissues were infiltrated 1% lidocaine overlying the right external jugular vein. Micropuncture set was then used to puncture the right external jugular vein. The micro wire passed easily into the IVC under fluoroscopy.  A small incision was made, and the peel-away sheath was placed over the wire. The wire was removed to estimate internal length of the catheter, which was estimated 18 cm.  A triple-lumen 5 French catheter was then cut at 18 cm in passed through the peel-away sheath.  Peel-away sheath was removed, final image was stored, and the catheter was sutured at the neck.  Patient tolerated the procedure well and remained hemodynamically  stable throughout.  No complications were encountered and no significant blood loss was encountered.  .  COMPLICATIONS: None  IMPRESSION: Status post placement of triple-lumen central catheter, via right external jugular vein approach. Catheter ready for use.  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  Signed,  [name]. [name], DO  [facility]  [facility]  Electronically Signed: By: Corrie Mckusick D.O. On: 01/20/2015 16:55   Medications: . heparin 1,050 Units/hr (01/20/15 1655)   . amLODipine  10 mg Oral QHS  . aspirin  325 mg Oral Daily  . calcium acetate  667 mg Oral TID WC  . clonazePAM  1 mg Oral QHS  . darbepoetin (ARANESP) injection - DIALYSIS  150 mcg Intravenous Q Thu-HD  . doxercalciferol  3 mcg Oral Q T,Th,Sa-HD  . ferric gluconate (FERRLECIT/NULECIT) IV  125 mg Intravenous Q Thu-HD  . gabapentin  100 mg Oral Daily  . insulin aspart  0-9 Units Subcutaneous TID WC  . insulin glargine  7 Units Subcutaneous BID  . metoCLOPramide  10 mg Oral TID AC & HS  . metoprolol succinate  50 mg Oral QHS  . montelukast  10 mg Oral QHS  . multivitamin  1 tablet Oral QHS  . simvastatin  20 mg Oral QHS  . sodium chloride  3 mL Intravenous Q12H  . Warfarin - Pharmacist Dosing Inpatient   Does not apply [phone]

## 2015-01-21 NOTE — Progress Notes (Signed)
UR Completed. Piers Baade, RN, BSN.  [phone] 

## 2015-01-21 NOTE — Progress Notes (Signed)
ANTICOAGULATION CONSULT NOTE -Follow-up  Pharmacy Consult for Heparin/coumdin Indication: DVT left common femoral vein  Allergies  Allergen Reactions  . Depakote [Divalproex Sodium] Other (See Comments)    Pt gets "delirious"  . Morphine And Related Nausea And Vomiting and Other (See Comments)    "makes me delirious"  . Penicillins Anaphylaxis    Tolerated Zosyn Dec 2014  . Tramadol Nausea And Vomiting  . Imitrex [Sumatriptan] Other (See Comments)    seizures  . Vicodin [Hydrocodone-Acetaminophen] Itching and Rash   Labs:  Recent Labs  01/20/15 1752 01/21/15 0153 01/21/15 0245 01/21/15 0755  HGB 9.6*  --  8.8*  --   HCT 30.7*  --  27.8*  --   PLT 164  --  157  --   LABPROT 18.9*  --  17.5*  --   INR 1.58*  --  1.43  --   HEPARINUNFRC  --  0.70  --  0.69    Estimated Creatinine Clearance: 15.5 mL/min (by C-G formula based on Cr of 4.41).  Assessment: 44 y.o. female presents with L leg pain and swelling s/p new L thigh AV graft placement 8/19. Duplex scan shows DVT in left common femoral vein. Heparin level 0.6 (therapeutic) on 1050 units/hr and INR= 1.43 on day 3 of heparin/coumadin overlap.  Goal of Therapy:  Heparin level 0.3-0.7 units/ml; INR 2-3 Monitor platelets by anticoagulation protocol: Yes   Plan:  Continue heparin gtt at 1050 units/hr Daily heparin level and CBC  Coumadin 7.5mg  po today Daily PT/INR  Hildred Laser, Pharm D 01/21/2015 10:08 AM

## 2015-01-21 NOTE — Progress Notes (Signed)
Inpatient Diabetes Program Recommendations  AACE/ADA: New Consensus Statement on Inpatient Glycemic Control (2013)  Target Ranges:  Prepandial:   less than 140 mg/dL      Peak postprandial:   less than 180 mg/dL (1-2 hours)      Critically ill patients:  140 - 180 mg/dL   Glucose much improved since Lantus given.  Agree with current orders. May need to add Novolog meal coverage if postprandials are elevated. Will follow. Thank you  Raoul Pitch BSN, RN,CDE Inpatient Diabetes Coordinator [phone] (team pager)

## 2015-01-21 NOTE — Progress Notes (Signed)
Patient back from dialysis. Held BP meds due to BP was low in dialysis and was unable to do it tonight. Patient received Albumin for low  BP while in dialysis. Bed time meds. Given when she arrived back. Patient having some nausea and vomiting small amt of emesis brown undig food. Just ate some pork chops.zofran 4 mg iv given.

## 2015-01-21 NOTE — Care Management Important Message (Signed)
Important Message  Patient Details  Name: [name] MRN: [id number] Date of Birth: 12-01-1970   Medicare Important Message Given:  Yes-third notification given    Maryclare Labrador, RN 01/21/2015, 11:54 AM

## 2015-01-21 NOTE — Progress Notes (Signed)
ANTICOAGULATION CONSULT NOTE -Follow-up  Pharmacy Consult for Heparin Indication: DVT left common femoral vein  Allergies  Allergen Reactions  . Depakote [Divalproex Sodium] Other (See Comments)    Pt gets "delirious"  . Morphine And Related Nausea And Vomiting and Other (See Comments)    "makes me delirious"  . Penicillins Anaphylaxis    Tolerated Zosyn Dec 2014  . Tramadol Nausea And Vomiting  . Imitrex [Sumatriptan] Other (See Comments)    seizures  . Vicodin [Hydrocodone-Acetaminophen] Itching and Rash   Labs:  Recent Labs  01/20/15 1752 01/21/15 0153  HGB 9.6*  --   HCT 30.7*  --   PLT 164  --   LABPROT 18.9*  --   INR 1.58*  --   HEPARINUNFRC  --  0.70    Estimated Creatinine Clearance: 15.2 mL/min (by C-G formula based on Cr of 4.41).  Assessment: 44 y.o. female presents with L leg pain and swelling s/p new L thigh AV graft placement 8/19. Duplex scan shows DVT in left common femoral vein. Heparin level 0.7 (therapeutic) on 1050 units/hr. No bleeding noted.  Goal of Therapy:  Heparin level 0.3-0.7 units/ml; INR 2-3 Monitor platelets by anticoagulation protocol: Yes   Plan:  Continue heparin gtt at 1050 units/hr Daily heparin level and CBC   Sherlon Handing, PharmD, BCPS Clinical pharmacist, pager [phone] 01/21/2015,2:50 AM

## 2015-01-21 NOTE — Progress Notes (Signed)
Pateint on her way to Dialysis  Will give meds when she returns.

## 2015-01-21 NOTE — Progress Notes (Signed)
Pt getting hemodialysis Tx. BP dropped in the 70's shortly after Tx started. UF off, Dr. Moshe Cipro called and informed of BP too low to pull fluid. 25Gm albumin given with BP improving to 105/58. Pts eyes suddenly rolled back and she went unresponsive. Pt would not respond to verbal stimulas and shaking her. Pt rinsed back and continued to remain lethargic for for approximately 63min after. Dr. Moshe Cipro called and informed of the episode and that she had been rinsed back.

## 2015-01-21 NOTE — Progress Notes (Signed)
   VASCULAR SURGERY ASSESSMENT & PLAN:  * Pain in left leg is improved.  * DVT left CFV: continue IV heparin until Coumadin therapeutic  * Stage 5 CKD: dialysis per renal.   * Hyperkalemia: resolved  * Home when Coumadin therapeutic  Continue leg elevation  SUBJECTIVE: Feels better  PHYSICAL EXAM: Filed Vitals:   01/21/15 0000 01/21/15 0030 01/21/15 0121 01/21/15 0522  BP: 100/56 115/33 130/69 111/57  Pulse: 66 69 69 72  Temp:   99.5 F (37.5 C) 97.5 F (36.4 C)  TempSrc:   Oral Oral  Resp:   16 16  Height:      Weight:    162 lb 7.7 oz (73.7 kg)  SpO2:   100%    Good thrill in left thigh AVG Leg swelling about the same  LABS: Lab Results  Component Value Date   WBC 6.6 01/21/2015   HGB 8.8* 01/21/2015   HCT 27.8* 01/21/2015   MCV 97.2 01/21/2015   PLT 157 01/21/2015   Lab Results  Component Value Date   CREATININE 4.41* 01/15/2015   Lab Results  Component Value Date   INR 1.43 01/21/2015   CBG (last 3)   Recent Labs  01/20/15 2330 01/21/15 0340 01/21/15 0526  GLUCAP 246* 111* 98    Active Problems:   Deep vein thrombosis (DVT)    Gae Gallop Beeper: [phone] 01/21/2015

## 2015-01-21 NOTE — Discharge Summary (Signed)
[facility] Discharge Summary   Patient ID:  [name] MRN: [id number] DOB/AGE: [birth year] 44 y.o.  Admit date: 01/14/2015 Discharge date: 01/15/2015 Date of Surgery: 01/14/2015 Surgeon: Surgeon(s): Angelia Mould, MD  Admission Diagnosis: End Stage Renal Disease N18.6  Discharge Diagnoses:  End Stage Renal Disease N18.6  Secondary Diagnoses: Past Medical History  Diagnosis Date  . Hyperlipidemia   . Alopecia   . Obesity   . Iron deficiency   . RLS (restless legs syndrome)   . Injury of conjunctiva and corneal abrasion of right eye without foreign body   . Cellulitis   . Anemia   . Blood transfusion 2004    "when I had my transplant"  . Migraine   . Hyperparathyroidism, secondary   . Diabetic retinopathy   . Hypertension     takes Metoprolol and Exforge daily  . Depression     takes Klonopin nightly  . Diabetes mellitus type 1     Pt states diagnosed at age 48 with prior episodes of DKA. S/p pancreatic transplant   . GERD (gastroesophageal reflux disease)     takes Protonix daily  . Bronchitis   . Migraine   . Asthma   . Emphysema of lung   . Angina     none in past 2 years.  . Stomach pain     Hx: of chronic  . Pneumonia 2012    "double"  . Dialysis patient     tues,thurs,sat  . 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  Surgery was done at [facility].     . DKA (diabetic ketoacidoses) 12/01/2014  . Diastolic CHF, acute on chronic 01/02/2015  . Stroke 03/2014    ? TIA  . ESRD (end stage renal disease)     S/p pancreatic and kidney transplant, but back on HD 2008, dialysis at [facility], [place], [place]    Procedure(s): INSERTION OF LEFT ARTERIOVENOUS (AV) GORE-TEX GRAFT THIGH  Discharged Condition: good  HPI: [name] is a 44 y.o. female who had a right thigh AV graft placed in 2013. This has been surgically revised once. According to the patient, she has had multiple procedures at [facility]. In reviewing her films she has had a stent placed in the iliac vein on the right. Her most recent study was done by the interventional radiologist last month and she had successful venoplasty of an outflow stenosis in the femoral vein and iliac vein. 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 She also has significant pain associated with her fistula. She feels that this pain is no longer tolerable.  She has a complicated access history. On the right side she had a DRIL procedure done because of steal. 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 This was not successful and therefore the right arm was abandoned and was not felt to be amenable to further access. 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 For these reasons she is not a candidate for upper extremity access. Her only remaining option for access is a left thigh AV graft.   Hospital Course:  [name] is a 44 y.o. female is S/P  Procedure(s): INSERTION OF LEFT ARTERIOVENOUS (AV) GORE-TEX GRAFT THIGH Nephrology was consulted for HD 01/14/2015.   POD# 1    POD # 1 New left thigh AV graft (4-7 mm PTFE graft) F/U PRN you may stick left thigh graft in 4 weeks D/C home after HD  Significant Diagnostic Studies: CBC Lab Results  Component Value Date   WBC 6.6 01/21/2015   HGB 8.8* 01/21/2015   HCT 27.8* 01/21/2015   MCV 97.2 01/21/2015   PLT 157 01/21/2015    BMET    Component Value Date/Time   NA 132* 01/15/2015 0908   K 3.3* 01/15/2015 0908   CL 97* 01/15/2015 0908   CO2 26 01/15/2015 0908   GLUCOSE 725* 01/20/2015 1834   BUN 15 01/15/2015 0908   CREATININE 4.41* 01/15/2015 0908   CALCIUM 8.1* 01/15/2015 0908    CALCIUM 8.4 03/06/2011 0751   GFRNONAA 11* 01/15/2015 0908   GFRAA 13* 01/15/2015 0908   COAG Lab Results  Component Value Date   INR 1.43 01/21/2015   INR 1.58* 01/20/2015   INR 1.06 01/27/2010     Disposition:  Discharge to :Home Discharge Instructions    Call MD for:  redness, tenderness, or signs of infection (pain, swelling, bleeding, redness, odor or green/yellow discharge around incision site)    Complete by:  As directed      Call MD for:  severe or increased pain, loss or decreased feeling  in affected limb(s)    Complete by:  As directed      Call MD for:  temperature >100.5    Complete by:  As directed      Discharge patient    Complete by:  As directed   Discharge pt to home     Discharge wound care:    Complete by:  As directed   Wash the groin wound with soap and water daily and pat dry. (No tub bath-only shower)  Then put a dry gauze or washcloth there to keep this area dry daily and as needed.  Do not use Vaseline or neosporin on your incisions.  Only use soap and water on your incisions and then protect and keep dry.     Driving Restrictions    Complete by:  As directed   No driving for 2 weeks     Lifting restrictions    Complete by:  As directed   No lifting for 4 weeks     Resume previous diet    Complete by:  As directed      may wash over wound with mild soap and water    Complete by:  As directed             Medication List    TAKE these medications        albuterol 108 (90 BASE) MCG/ACT inhaler  Commonly known as:  PROVENTIL HFA;VENTOLIN HFA  Inhale 2 puffs into the lungs every 4 (four) hours as needed for wheezing or shortness of breath (as per home regimen).     amLODipine 10 MG tablet  Commonly known as:  NORVASC  Take 10 mg by mouth at bedtime.     aspirin 325 MG tablet  Take 1 tablet (325 mg total) by mouth daily.     calcium acetate 667 MG capsule  Commonly known as:  PHOSLO  Take 1 capsule (667 mg total) by mouth 2 (two)  times daily.     clonazePAM 0.5 MG tablet  Commonly known as:  Bobbye [place]  Take 1 mg by mouth at bedtime.     Darbepoetin Alfa 40 MCG/0.4ML Sosy injection  Commonly known as:  ARANESP  Inject 0.4 mLs (40 mcg total) into the vein every Thursday with hemodialysis.     doxercalciferol 4 MCG/2ML injection  Commonly known as:  HECTOROL  Inject 1 mL (2 mcg total) into the vein Every Tuesday,Thursday,and Saturday with dialysis.     feeding supplement (NEPRO CARB STEADY) Liqd  Take 237 mLs by mouth 2 (two) times daily between meals.     gabapentin 100 MG capsule  Commonly known as:  NEURONTIN  Take 100 mg by mouth daily.     guaiFENesin 600 MG 12 hr tablet  Commonly known as:  MUCINEX  Take 1 tablet (600 mg total) by mouth 2 (two) times daily.     HUMALOG KWIKPEN 100 UNIT/ML KiwkPen  Generic drug:  insulin lispro  Inject 5-6 Units into the skin 2 (two) times daily. Inject 6 units subcutaneously in the morning and 5 units in the evening     insulin glargine 100 UNIT/ML injection  Commonly known as:  LANTUS  Inject 0.07 mLs (7 Units total) into the skin 2 (two) times daily.     lidocaine 5 %  Commonly known as:  LIDODERM  Place 1 patch onto the skin daily. Remove & Discard patch within 12 hours or as directed by MD     metoCLOPramide 10 MG tablet  Commonly known as:  REGLAN  Take 1 tablet (10 mg total) by mouth 4 (four) times daily -  before meals and at bedtime.     metoprolol succinate 50 MG 24 hr tablet  Commonly known as:  TOPROL-XL  Take 1 tablet (50 mg total) by mouth daily. Take with or immediately following a meal.     montelukast 10 MG tablet  Commonly known as:  SINGULAIR  Take 10 mg by mouth at bedtime.     multivitamin Tabs tablet  Take 1 tablet by mouth daily.     Oxycodone HCl 10 MG Tabs  Take 1 tablet (10 mg total) by mouth 3 (three) times daily as needed (for pain).     Oxycodone HCl 10 MG Tabs  Take 1 tablet (10 mg total) by mouth 3 (three) times daily as  needed (for pain).     pantoprazole 40 MG tablet  Commonly known as:  PROTONIX  Take 1 tablet (40 mg total) by mouth at bedtime.     promethazine 25 MG tablet  Commonly known as:  PHENERGAN  Take 1 tablet (25 mg total) by mouth every 6 (six) hours as needed for nausea or vomiting.     simvastatin 20 MG tablet  Commonly known as:  ZOCOR  Take 1 tablet (20 mg total) by mouth at bedtime.     zolpidem 5 MG tablet  Commonly known as:  AMBIEN  Take 5 mg by mouth at bedtime as needed. sleep      ASK your doctor about these medications        levofloxacin 500 MG tablet  Commonly known as:  LEVAQUIN  Take 1 tablet (500 mg total) by mouth every other day.  Ask about: Should I take this medication?       Verbal and written Discharge instructions given to the patient. Wound care per Discharge AVS     Follow-up Information    Follow up with Deitra Mayo, MD In 2 weeks.   Specialties:  Vascular Surgery, Cardiology   Why:  Office will call you to arrange your appt (sent)   Contact information:   [place] [address] [phone]       Signed: Laurence Slate [facility] 01/21/2015, 12:45 PM

## 2015-01-22 LAB — CBC
HEMATOCRIT: 25.9 % — AB (ref 36.0–46.0)
HEMOGLOBIN: 8.3 g/dL — AB (ref 12.0–15.0)
MCH: 30.9 pg (ref 26.0–34.0)
MCHC: 32 g/dL (ref 30.0–36.0)
MCV: 96.3 fL (ref 78.0–100.0)
Platelets: 143 10*3/uL — ABNORMAL LOW (ref 150–400)
RBC: 2.69 MIL/uL — AB (ref 3.87–5.11)
RDW: 15.8 % — AB (ref 11.5–15.5)
WBC: 6.8 10*3/uL (ref 4.0–10.5)

## 2015-01-22 LAB — GLUCOSE, CAPILLARY
GLUCOSE-CAPILLARY: 47 mg/dL — AB (ref 65–99)
GLUCOSE-CAPILLARY: 149 mg/dL — AB (ref 65–99)
Glucose-Capillary: 248 mg/dL — ABNORMAL HIGH (ref 65–99)
Glucose-Capillary: 102 mg/dL — ABNORMAL HIGH (ref 65–99)
Glucose-Capillary: 62 mg/dL — ABNORMAL LOW (ref 65–99)
Glucose-Capillary: 106 mg/dL — ABNORMAL HIGH (ref 65–99)
Glucose-Capillary: 177 mg/dL — ABNORMAL HIGH (ref 65–99)

## 2015-01-22 LAB — RENAL FUNCTION PANEL
ALBUMIN: 2.8 g/dL — AB (ref 3.5–5.0)
ANION GAP: 11 (ref 5–15)
BUN: 60 mg/dL — ABNORMAL HIGH (ref 6–20)
CALCIUM: 8.7 mg/dL — AB (ref 8.9–10.3)
CO2: 24 mmol/L (ref 22–32)
CREATININE: 9.9 mg/dL — AB (ref 0.44–1.00)
Chloride: 94 mmol/L — ABNORMAL LOW (ref 101–111)
GFR calc Af Amer: 5 mL/min — ABNORMAL LOW (ref >60)
GFR calc non Af Amer: 4 mL/min — ABNORMAL LOW (ref >60)
GLUCOSE: 166 mg/dL — AB (ref 65–99)
PHOSPHORUS: 3.2 mg/dL (ref 2.5–4.6)
Potassium: 6.2 mmol/L (ref 3.5–5.1)
SODIUM: 129 mmol/L — AB (ref 135–145)

## 2015-01-22 LAB — HEPARIN LEVEL (UNFRACTIONATED): HEPARIN UNFRACTIONATED: 0.37 [IU]/mL (ref 0.30–0.70)

## 2015-01-22 LAB — HEPATITIS B SURFACE ANTIGEN: HEP B S AG: NEGATIVE

## 2015-01-22 LAB — PROTIME-INR
INR: 1.94 — ABNORMAL HIGH (ref 0.00–1.49)
PROTHROMBIN TIME: 22 s — AB (ref 11.6–15.2)

## 2015-01-22 MED ORDER — WARFARIN SODIUM 2.5 MG PO TABS
2.5000 mg | ORAL_TABLET | Freq: Once | ORAL | Status: AC
Start: 2015-01-22 — End: 2015-01-22
  Administered 2015-01-22: 2.5 mg via ORAL
  Filled 2015-01-22: qty 1

## 2015-01-22 NOTE — Progress Notes (Addendum)
ANTICOAGULATION CONSULT NOTE -Follow-up  Pharmacy Consult for Heparin/coumdin Indication: DVT left common femoral vein  Allergies  Allergen Reactions  . Depakote [Divalproex Sodium] Other (See Comments)    Pt gets "delirious"  . Morphine And Related Nausea And Vomiting and Other (See Comments)    "makes me delirious"  . Penicillins Anaphylaxis    Tolerated Zosyn Dec 2014  . Tramadol Nausea And Vomiting  . Imitrex [Sumatriptan] Other (See Comments)    seizures  . Vicodin [Hydrocodone-Acetaminophen] Itching and Rash   Labs:  Recent Labs  01/20/15 1752 01/21/15 0153 01/21/15 0245 01/21/15 0755 01/22/15 0400  HGB 9.6*  --  8.8*  --  8.3*  HCT 30.7*  --  27.8*  --  25.9*  PLT 164  --  157  --  143*  LABPROT 18.9*  --  17.5*  --  22.0*  INR 1.58*  --  1.43  --  1.94*  HEPARINUNFRC  --  0.70  --  0.69 0.37    Estimated Creatinine Clearance: 15.5 mL/min (by C-G formula based on Cr of 4.41).  Assessment: 44 y.o. female presents with L leg pain and swelling s/p new L thigh AV graft placement 8/19. Duplex scan shows DVT in left common femoral vein. Heparin level 0.37(therapeutic) on 1050 units/hr and INR= 1.94 on day 4 of heparin/coumadin overlap. Coumadin dose increased 8/27 and anticipate continued INR increase in am. Plans noted for d/c on 8/28.   Goal of Therapy:  Heparin level 0.3-0.7 units/ml; INR 2-3 Monitor platelets by anticoagulation protocol: Yes   Plan:  Continue heparin gtt at 1050 units/hr Daily heparin level and CBC  Coumadin 2.5mg  po today Home maintenance dose will depend on INR trend in am Daily PT/INR Consider decreasing aspirin to 81mg /day?  Hildred Laser, Pharm D 01/22/2015 10:22 AM

## 2015-01-22 NOTE — Progress Notes (Signed)
Patient sleeping did not want to watch coumadin video

## 2015-01-22 NOTE — Progress Notes (Addendum)
[facility] [place]  Subjective  - She has HD today.  States her leg pain is a little better.   Objective 103/42 70 98.5 F (36.9 C) (Oral) 16 94%  Intake/Output Summary (Last 24 hours) at 01/22/15 0930 Last data filed at 01/22/15 2707  Gross per 24 hour  Intake 1266.63 ml  Output      0 ml  Net 1266.63 ml   Palpable thrill left AV thigh graft Left leg edema Active range of motion in foot and ankle intact, foot warm to touch   Assessment/Planning:  Left thigh AV graft on 01/14/2015 with a 4-7 mm PTFE graft.  DATA:  VENOUS DUPLEX: This shows that her left thigh AV graft is patent with good flow. However, there is a partially occlusive thrombus in the common femoral vein below the femoral anastomosis.  MEDICAL ISSUES:  DVT LEFT COMMON FEMORAL VEIN: The patient will be admitted for intravenous heparin and leg elevation. She will require Coumadin therapy. We'll have to arrange for her dialysis in the hospital using her right thigh graft. The left thigh graft is still functioning with a good thrill. If her symptoms worsen we would have to consider ligation of her graft or removal.  Coumadin is 1.94 today and she has HD set up for today. Plan D/C tomorrow.  Laurence Slate [facility] 01/22/2015 9:30 AM --  Laboratory Lab Results:  Recent Labs  01/21/15 0245 01/22/15 0400  WBC 6.6 6.8  HGB 8.8* 8.3*  HCT 27.8* 25.9*  PLT 157 143*   BMET  Recent Labs  01/20/15 1834  GLUCOSE 725*    COAG Lab Results  Component Value Date   INR 1.94* 01/22/2015   INR 1.43 01/21/2015   INR 1.58* 01/20/2015   No results found for: PTT

## 2015-01-22 NOTE — Discharge Instructions (Signed)

## 2015-01-22 NOTE — Progress Notes (Signed)
Subjective:  No new complaints, standing wt 74 kg done just now  Objective Vital signs in last 24 hours: Filed Vitals:   01/21/15 0121 01/21/15 0522 01/21/15 0830 01/22/15 0355  BP: 130/69 111/57  103/42  Pulse: 69 72  70  Temp: 99.5 F (37.5 C) 97.5 F (36.4 C)  98.5 F (36.9 C)  TempSrc: Oral Oral  Oral  Resp: 16 16  16   Height:      Weight:  73.7 kg (162 lb 7.7 oz)    SpO2: 100%  100% 94%   Weight change:    Physical Exam General: alert , nad  AA female, cooperative Heart:  RRR  Soft 1/6 sem lsb , No/G/R Lungs: Bilateral breath sounds slightly decreased R base.otherwise CTA Abdomen:soft nontender active BS Extremities: 1+ LE edema bilat Access: R thigh AVG + thrill + bruit. / L fem Avvg Pos. With swelling   TTS North  3h 42m   67.5kg   2/2.5 bath  R thigh AVG (new L fem AVG 8/19)  Heparin none Hect 3 ug Venofer 50 Mircera 100 q 2wks ( start 01/20/15) Last hgb 9.8 ca 8.8 phos 4.2  pth 163  Assessment: 1 L groin DVT- per VVS 2 ESRD HD today 3 Anemia -  esa and fe 4 MBD cont meds 5 HTN holding meds 6 Vol excess up 7kg today  7 DM type 1  Plan - HD today, max UF , vol overloaded  Kelly Splinter MD pager [phone]    cell [phone] 01/22/2015, 10:42 AM    Labs: Basic Metabolic Panel:  Recent Labs Lab 01/20/15 1834  GLUCOSE 725*   Liver Function Tests: No results for input(s): AST, ALT, ALKPHOS, BILITOT, PROT, ALBUMIN in the last 168 hours. No results for input(s): LIPASE, AMYLASE in the last 168 hours. No results for input(s): AMMONIA in the last 168 hours. CBC:  Recent Labs Lab 01/20/15 1752 01/21/15 0245 01/22/15 0400  WBC 7.0 6.6 6.8  HGB 9.6* 8.8* 8.3*  HCT 30.7* 27.8* 25.9*  MCV 99.4 97.2 96.3  PLT 164 157 143*   Cardiac Enzymes: No results for input(s): CKTOTAL, CKMB, CKMBINDEX, TROPONINI in the last 168 hours. CBG:  Recent Labs Lab 01/21/15 0835 01/21/15 1110 01/21/15 1603 01/21/15 2137 01/22/15 0629  GLUCAP 102* 128* 169* 248*  102*    Studies/Results: Ir Fluoro Guide Cv Line Right  01/21/2015   ADDENDUM REPORT: 01/21/2015 09:07  ADDENDUM: Addendum is created to address findings of the study.  FINDINGS: Upon completion of right external jugular central venous catheter placement, tip of the catheter present at the superior cavoatrial junction.  Catheter ready for use.  Signed,  [name]. [name], DO  [facility]  [facility]   Electronically Signed   By: Corrie Mckusick D.O.   On: 01/21/2015 09:07   01/21/2015   CLINICAL DATA:  44 year old female with a history of renal failure.  She has been referred for placement of central venous catheter, as she is requiring treatment for DVT.  EXAM: IR RIGHT FLOURO GUIDE CV LINE; IR ULTRASOUND GUIDANCE VASC ACCESS RIGHT  Date: 01/20/2015  ANESTHESIA/SEDATION: None  FLUOROSCOPY TIME:  1 minutes 0 seconds  TECHNIQUE: The right neck and chest was prepped with chlorhexidine, and draped in the usual sterile fashion using maximum barrier technique (cap and mask, sterile gown, sterile gloves, large sterile sheet, hand hygiene and cutaneous antiseptic). Local anesthesia was attained by infiltration with 1% lidocaine with epinephrine.  Ultrasound demonstrated patency of  the right internal jugular vein, at the level of the sternocleidomastoid. Under real-time ultrasound guidance, this vein was accessed with a 21 gauge micropuncture needle and image documentation was performed. 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  Ultrasound survey of the right external jugular vein was then performed. The skin and subcutaneous tissues were infiltrated 1% lidocaine overlying the right external jugular vein. Micropuncture set was then used to puncture the right external jugular vein. The micro wire passed easily into the IVC under fluoroscopy.  A small incision was  made, and the peel-away sheath was placed over the wire. The wire was removed to estimate internal length of the catheter, which was estimated 18 cm.  A triple-lumen 5 French catheter was then cut at 18 cm in passed through the peel-away sheath.  Peel-away sheath was removed, final image was stored, and the catheter was sutured at the neck.  Patient tolerated the procedure well and remained hemodynamically stable throughout.  No complications were encountered and no significant blood loss was encountered.  .  COMPLICATIONS: None  IMPRESSION: Status post placement of triple-lumen central catheter, via right external jugular vein approach. Catheter ready for use.  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  Signed,  [name]. [name], DO  [facility]  [facility]  Electronically Signed: By: Corrie Mckusick D.O. On: 01/20/2015 16:55   Ir US Guide Vasc Access Right  01/21/2015   ADDENDUM REPORT: 01/21/2015 09:07  ADDENDUM: Addendum is created to address findings of the study.  FINDINGS: Upon completion of right external jugular central venous catheter placement, tip of the catheter present at the superior cavoatrial junction.  Catheter ready for use.  Signed,  [name]. [name], DO  [facility]  [facility]   Electronically Signed   By: Corrie Mckusick D.O.   On: 01/21/2015 09:07   01/21/2015   CLINICAL DATA:  44 year old female with a history of renal failure.  She has been referred for placement of central venous catheter, as she is requiring treatment for DVT.  EXAM: IR RIGHT FLOURO GUIDE CV LINE; IR ULTRASOUND GUIDANCE VASC ACCESS RIGHT  Date: 01/20/2015  ANESTHESIA/SEDATION: None  FLUOROSCOPY TIME:  1 minutes 0 seconds  TECHNIQUE: The right neck and chest was prepped with chlorhexidine, and draped in the usual sterile fashion using maximum barrier technique (cap and mask, sterile gown, sterile gloves, large  sterile sheet, hand hygiene and cutaneous antiseptic). Local anesthesia was attained by infiltration with 1% lidocaine with epinephrine.  Ultrasound demonstrated patency of the right internal jugular vein, at the level of the sternocleidomastoid. Under real-time ultrasound guidance, this vein was accessed with a 21 gauge micropuncture needle and image documentation was performed. 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  Ultrasound survey of the right external jugular vein was then performed. The skin and subcutaneous tissues were infiltrated 1% lidocaine overlying the right external jugular vein. Micropuncture set was then used to puncture the right external jugular vein. The micro wire passed easily into the IVC under fluoroscopy.  A small incision was made, and the peel-away sheath was placed over the wire. The wire was removed to estimate internal length of the catheter, which was estimated 18 cm.  A triple-lumen 5 French catheter was then  cut at 18 cm in passed through the peel-away sheath.  Peel-away sheath was removed, final image was stored, and the catheter was sutured at the neck.  Patient tolerated the procedure well and remained hemodynamically stable throughout.  No complications were encountered and no significant blood loss was encountered.  .  COMPLICATIONS: None  IMPRESSION: Status post placement of triple-lumen central catheter, via right external jugular vein approach. Catheter ready for use.  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  Signed,  [name]. [name], DO  [facility]  [facility]  Electronically Signed: By: Corrie Mckusick D.O. On: 01/20/2015 16:55   Medications: . heparin 1,050 Units/hr (01/21/15 1303)   . aspirin  325 mg Oral Daily  . calcium acetate  667 mg Oral TID WC  . clonazePAM  1 mg Oral QHS  .  darbepoetin (ARANESP) injection - DIALYSIS  150 mcg Intravenous Q Thu-HD  . doxercalciferol  3 mcg Oral Q T,Th,Sa-HD  . ferric gluconate (FERRLECIT/NULECIT) IV  125 mg Intravenous Q Thu-HD  . gabapentin  100 mg Oral Daily  . insulin aspart  0-9 Units Subcutaneous TID WC  . insulin glargine  7 Units Subcutaneous BID  . metoCLOPramide  10 mg Oral TID AC & HS  . montelukast  10 mg Oral QHS  . multivitamin  1 tablet Oral QHS  . simvastatin  20 mg Oral QHS  . sodium chloride  3 mL Intravenous Q12H  . warfarin  2.5 mg Oral ONCE-1800  . warfarin   Does not apply Once  . Warfarin - Pharmacist Dosing Inpatient   Does not apply [phone]

## 2015-01-23 LAB — HEPARIN LEVEL (UNFRACTIONATED): Heparin Unfractionated: 0.42 IU/mL (ref 0.30–0.70)

## 2015-01-23 LAB — GLUCOSE, CAPILLARY
GLUCOSE-CAPILLARY: 53 mg/dL — AB (ref 65–99)
Glucose-Capillary: 56 mg/dL — ABNORMAL LOW (ref 65–99)
Glucose-Capillary: 101 mg/dL — ABNORMAL HIGH (ref 65–99)

## 2015-01-23 LAB — CBC
HEMATOCRIT: 25.9 % — AB (ref 36.0–46.0)
Hemoglobin: 8.2 g/dL — ABNORMAL LOW (ref 12.0–15.0)
MCH: 31.2 pg (ref 26.0–34.0)
MCHC: 31.7 g/dL (ref 30.0–36.0)
MCV: 98.5 fL (ref 78.0–100.0)
PLATELETS: 156 10*3/uL (ref 150–400)
RBC: 2.63 MIL/uL — AB (ref 3.87–5.11)
RDW: 16.2 % — ABNORMAL HIGH (ref 11.5–15.5)
WBC: 6.4 10*3/uL (ref 4.0–10.5)

## 2015-01-23 LAB — PROTIME-INR
INR: 2.06 — ABNORMAL HIGH (ref 0.00–1.49)
Prothrombin Time: 23.1 seconds — ABNORMAL HIGH (ref 11.6–15.2)

## 2015-01-23 MED ORDER — WARFARIN SODIUM 5 MG PO TABS: 2.5000 mg | ORAL_TABLET | Freq: Every day | ORAL | Status: AC

## 2015-01-23 MED ORDER — OXYCODONE HCL 10 MG PO TABS: 10.0000 mg | ORAL_TABLET | Freq: Three times a day (TID) | ORAL | Status: DC | PRN

## 2015-01-23 NOTE — Progress Notes (Signed)
01/23/2015 4:49 AM Pt has a low BP of 88/41.  Normally her BP is around 110/50.  Patient's heart rate is at 70 bpm.  The patient did have dialysis yesterday and is asymptomatic.  Dr. Donnetta Hutching was informed of the patient's low BP and says that it is fine for right now.  No new orders given.  Will continue to monitor the patient.  [name]

## 2015-01-23 NOTE — Progress Notes (Addendum)
[facility] [place]  Subjective  - Doing well over all.  Still complains of surgical pain, but po pain medication helps.   Objective 88/41 70 98.2 F (36.8 C) (Oral) 18 98%  Intake/Output Summary (Last 24 hours) at 01/23/15 0745 Last data filed at 01/23/15 0645  Gross per 24 hour  Intake    845 ml  Output   3973 ml  Net  -3128 ml   Palpable thrill left AV thigh graft Left leg edema Active range of motion in foot and ankle intact, foot warm to touch Lungs non labored breathing   Assessment/Planning: Left thigh AV graft on 01/14/2015 with a 4-7 mm PTFE graft. DVT LEFT COMMON FEMORAL VEIN:  Plan discharge home today she will call her primary care physician tomorrow [name], Gifford Shave in [place] for INR check. She will be discharged home on 2.5 mg coumadin HD T-TH-SAT Disposition stable  Theda Sers, EMMA MAUREEN 01/23/2015 7:45 AM --  Laboratory Lab Results:  Recent Labs  01/22/15 0400 01/23/15 0413  WBC 6.8 6.4  HGB 8.3* 8.2*  HCT 25.9* 25.9*  PLT 143* 156   BMET  Recent Labs  01/20/15 1834 01/22/15 1408  NA  --  129*  K  --  6.2*  CL  --  94*  CO2  --  24  GLUCOSE 725* 166*  BUN  --  60*  CREATININE  --  9.90*  CALCIUM  --  8.7*    COAG Lab Results  Component Value Date   INR 2.06* 01/23/2015   INR 1.94* 01/22/2015   INR 1.43 01/21/2015   No results found for: PTT

## 2015-01-23 NOTE — Progress Notes (Addendum)
ANTICOAGULATION CONSULT NOTE -Follow-up  Pharmacy Consult for Heparin/coumdin Indication: DVT left common femoral vein  Allergies  Allergen Reactions  . Depakote [Divalproex Sodium] Other (See Comments)    Pt gets "delirious"  . Morphine And Related Nausea And Vomiting and Other (See Comments)    "makes me delirious"  . Penicillins Anaphylaxis    Tolerated Zosyn Dec 2014  . Tramadol Nausea And Vomiting  . Imitrex [Sumatriptan] Other (See Comments)    seizures  . Vicodin [Hydrocodone-Acetaminophen] Itching and Rash   Labs:  Recent Labs  01/21/15 0245 01/21/15 0755 01/22/15 0400 01/22/15 1408 01/23/15 0413  HGB 8.8*  --  8.3*  --  8.2*  HCT 27.8*  --  25.9*  --  25.9*  PLT 157  --  143*  --  156  LABPROT 17.5*  --  22.0*  --  23.1*  INR 1.43  --  1.94*  --  2.06*  HEPARINUNFRC  --  0.69 0.37  --  0.42  CREATININE  --   --   --  9.90*  --     Estimated Creatinine Clearance: 6.9 mL/min (by C-G formula based on Cr of 9.9).  Assessment: 44 y.o. female presents with L leg pain and swelling s/p new L thigh AV graft placement 8/19. Duplex scan shows DVT in left common femoral vein. Heparin level 0.42(therapeutic) on 1050 units/hr and INR= 2.06 on heparin/coumadin overlap.  Plans noted for d/c today.  Core measure note: Heparin was not started until 01/20/15 due to IV access issues and the patient refused lovenox. To prevent delayed discharge the patient is being discharged on day 4 of heparin/coumadin overlap.   Goal of Therapy:  Heparin level 0.3-0.7 units/ml; INR 2-3 Monitor platelets by anticoagulation protocol: Yes   Plan:  Continue heparin gtt at 1050 units/hr Daily heparin level and CBC  Daily PT/INR if she remains here Consider coumadin 5mg /day at discharge Consider decreasing aspirin to 81mg /day?  Hildred Laser, Pharm D 01/23/2015 10:38 AM

## 2015-01-24 ENCOUNTER — Telehealth: Payer: Self-pay | Admitting: *Deleted

## 2015-01-24 IMAGING — CR DG CHEST 2V
2 series · 2 of 2 positions shown · non-contrast
Comparison: November 18, 2012

CLINICAL DATA: Repair of the pseudoaneurysm of the right benign

CHEST - 2 VIEW

[w chest pa]
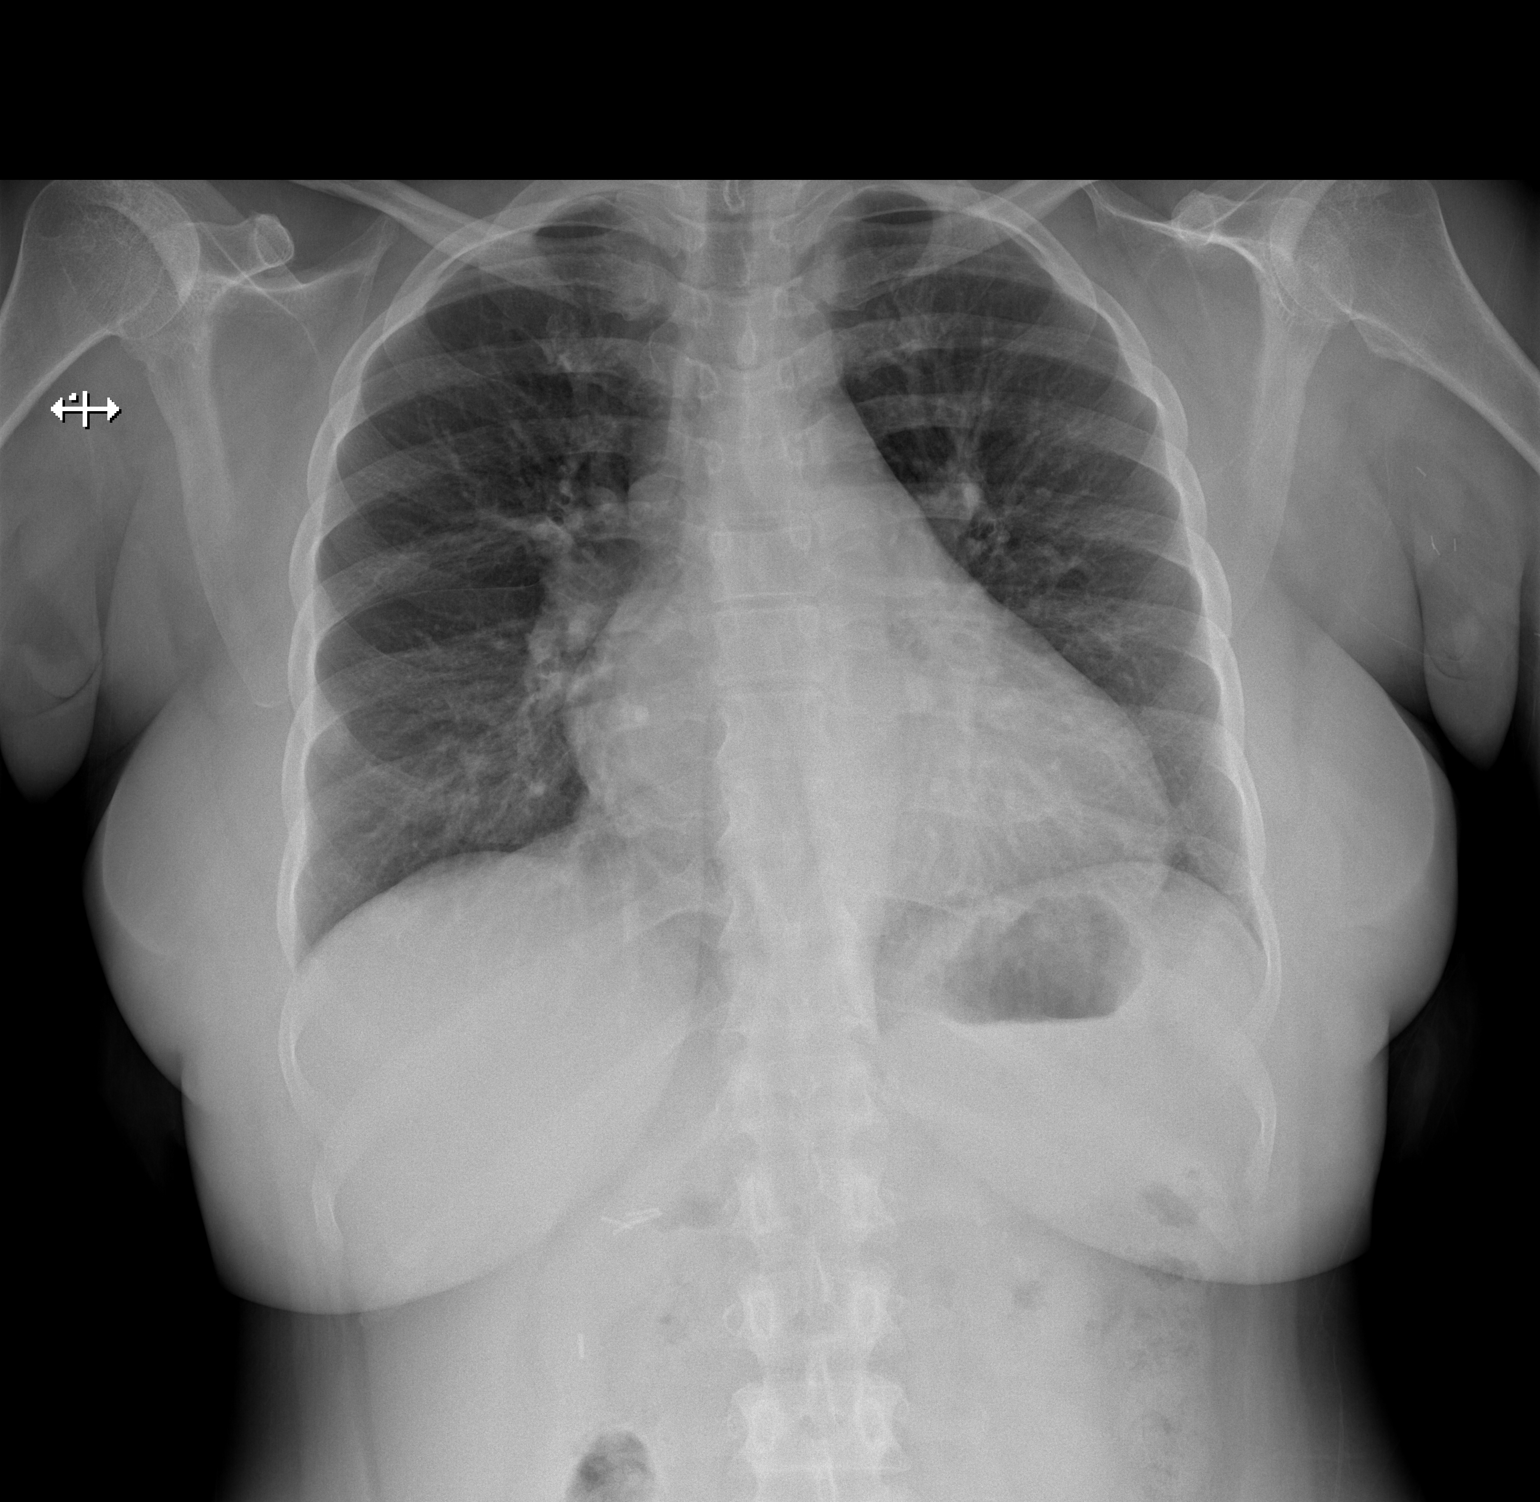

[w chest lat]
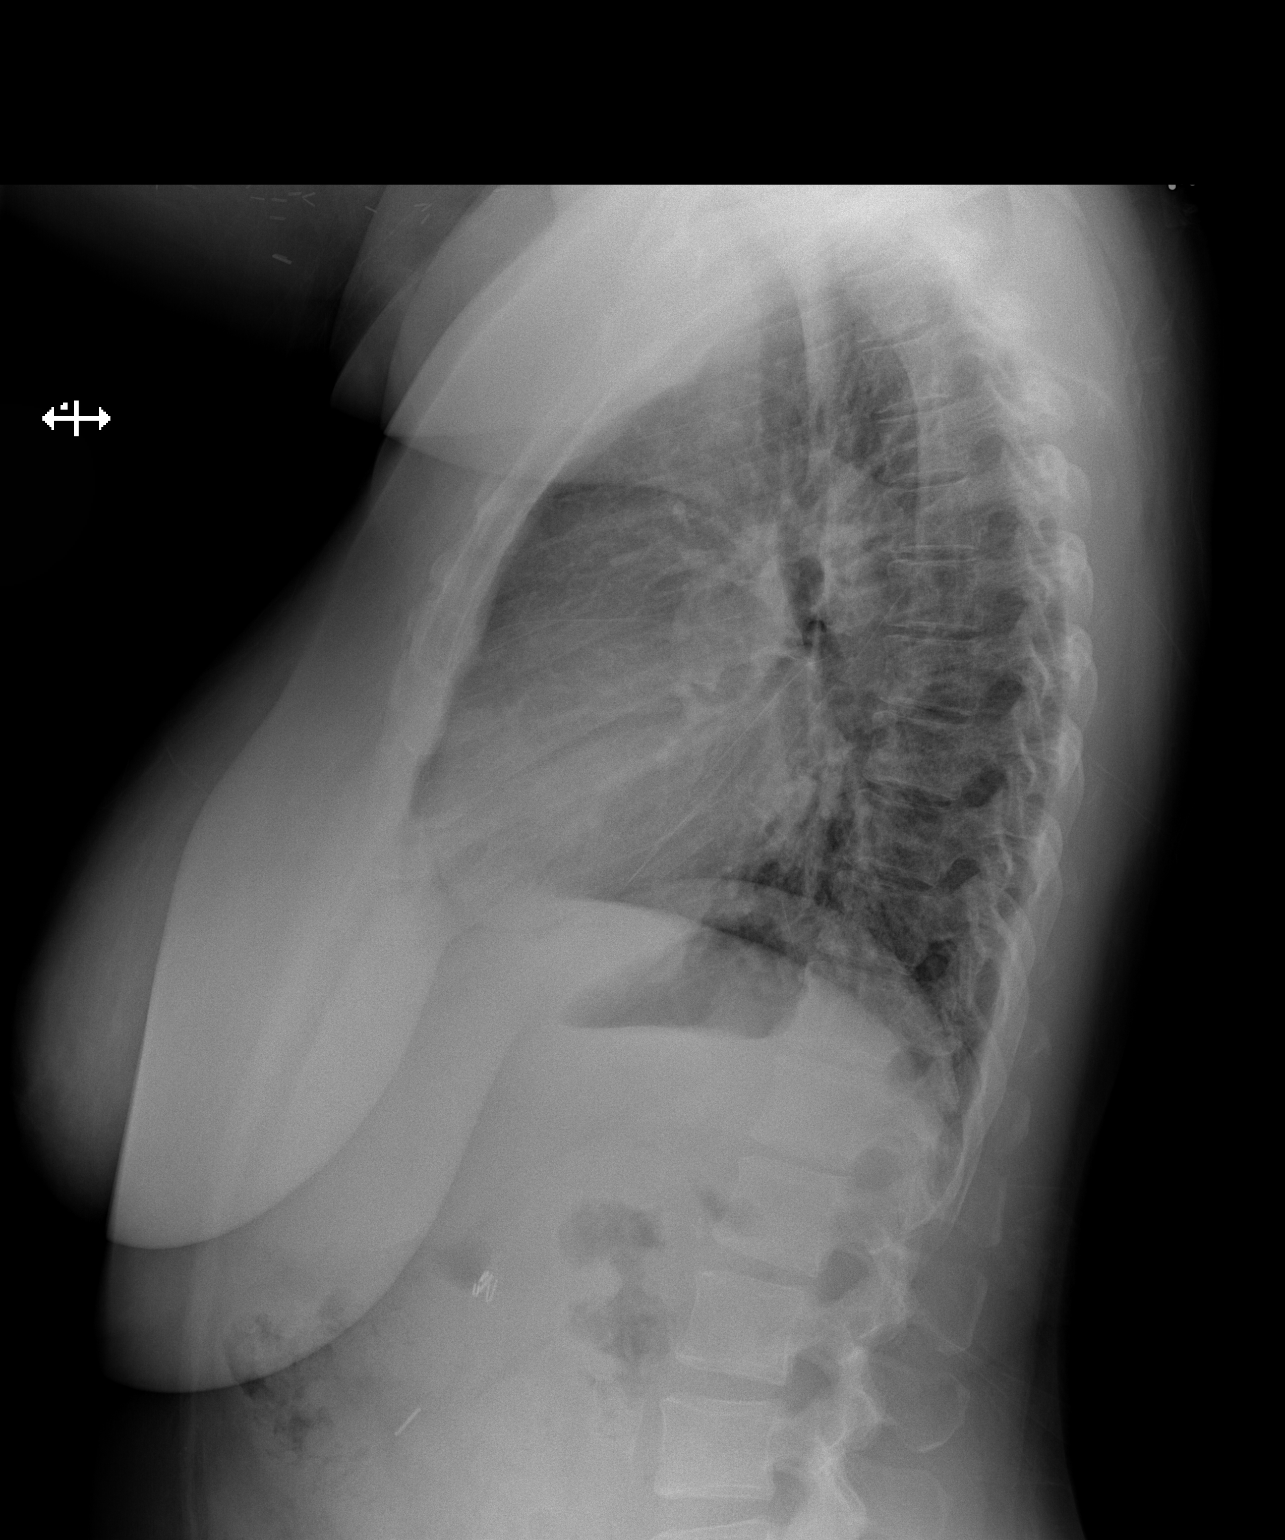

[2 of 2 positions shown; findings below may reference images not displayed]

FINDINGS: There is no focal infiltrate, pulmonary edema, or pleural
effusion.  The mediastinal contour and cardiac silhouette are
stable.  The soft tissues and osseous structures are stable.
IMPRESSION: No acute cardiopulmonary disease identified.

## 2015-01-24 NOTE — Telephone Encounter (Signed)
-----   Message from Ulyses Amor, [place] sent at 01/23/2015  7:57 AM EDT ----- S/P left thigh graft patient has DVT in left femoral vein started on coumadin.  Can you follow up on primary visit in the next 2 days Her Primary is [name], [facility] in [place].  Thanks [facility]  She is suppose to call herself tomorrow just want to make sure she gets seen for INR checks thx.

## 2015-01-24 NOTE — Telephone Encounter (Signed)
LMTCB- I called Dr. Mickel Crow at [facility], [name] has not been seen there since 2014. They will be happy to see her though; Left Akeisha a message to contact them asap to have Coumadin monitored. If she has been seeing another doctor since 2014, she will need to call us so we may update our records.

## 2015-01-26 IMAGING — CT CT HEAD W/O CM
1 series · 15 of 29 positions shown, 19 images · non-contrast
Comparison: Head CT 08/28/2011

CLINICAL DATA: Hypoglycemia.

CT HEAD WITHOUT CONTRAST
TECHNIQUE: Contiguous axial images were obtained from the base of
the skull through the vertex without contrast.

[Series 2: head 5.0 h30s · axial · 0.44mm/px · z∈[-112,+18]mm · 15 of 29 slices shown, 19 images]
[im 2/29  brain]
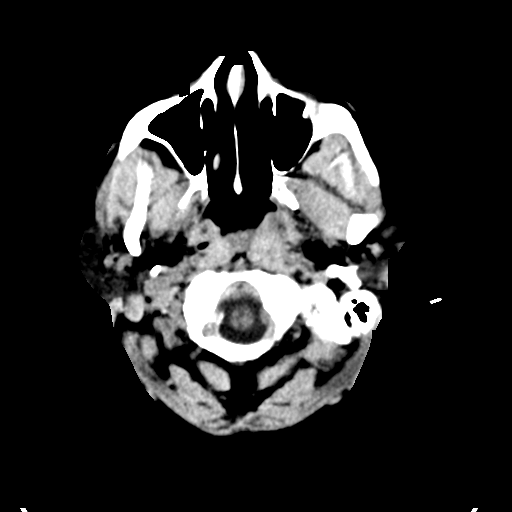
[im 2/29  bone]
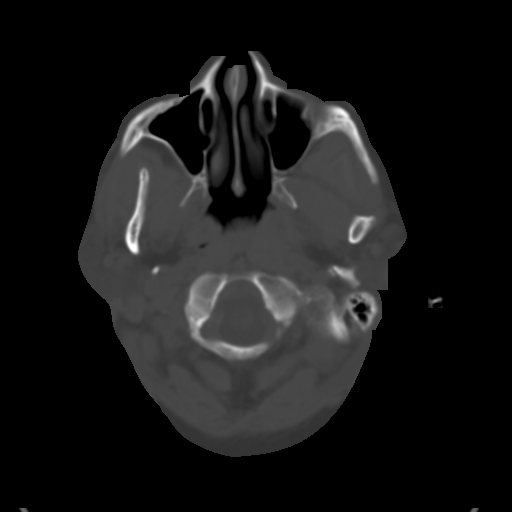
[im 4/29  brain]
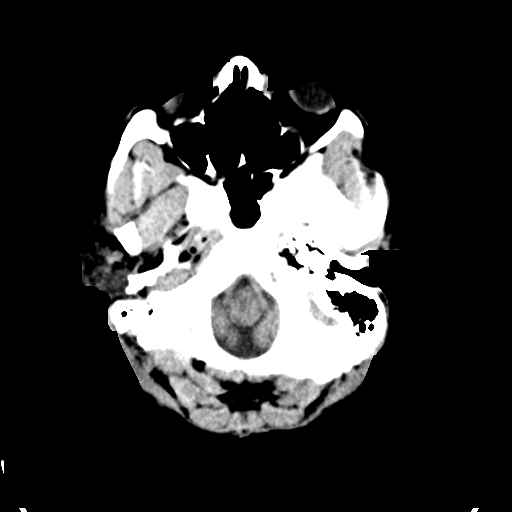
[im 6/29  brain]
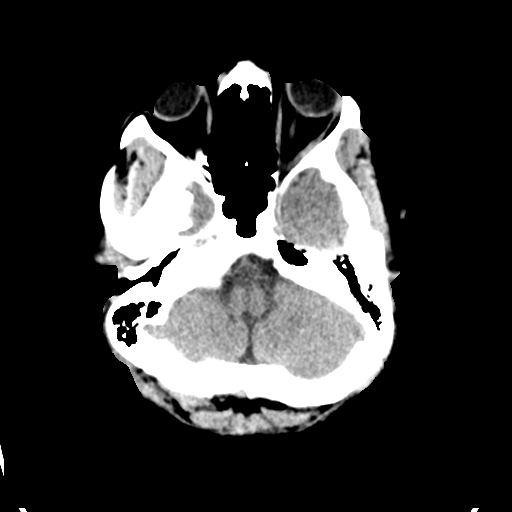
[im 8/29  brain]
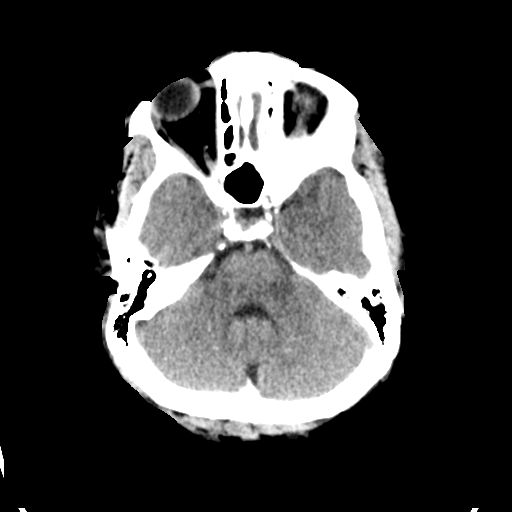
[im 10/29  brain]
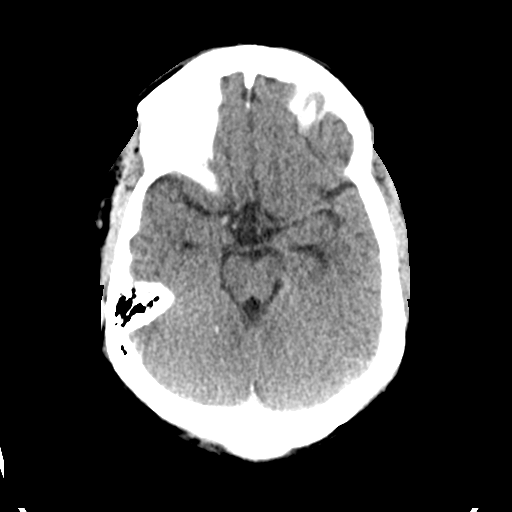
[im 10/29  bone]
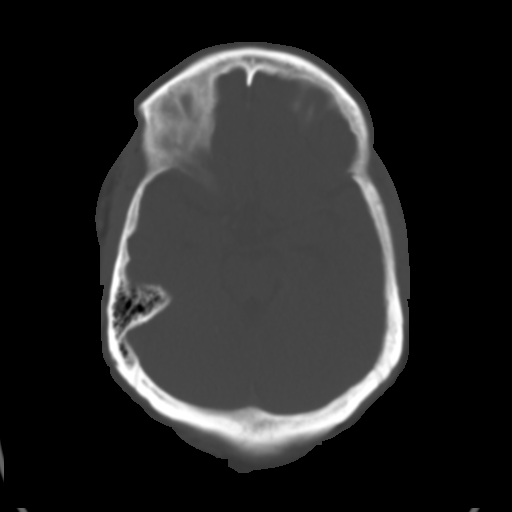
[im 11/29  brain]
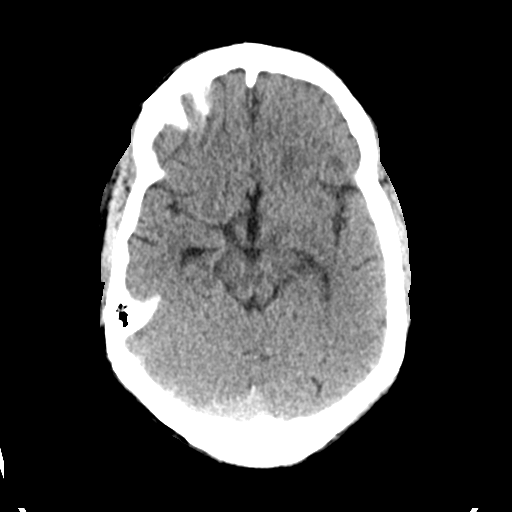
[im 13/29  brain]
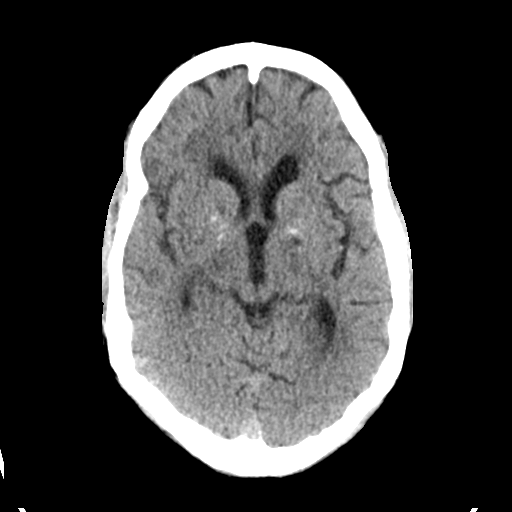
[im 15/29  brain]
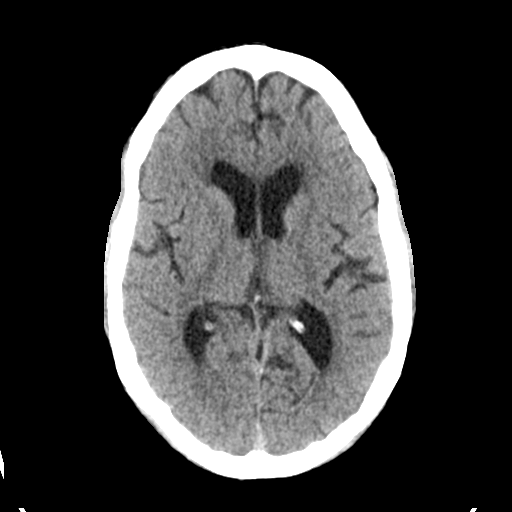
[im 17/29  brain]
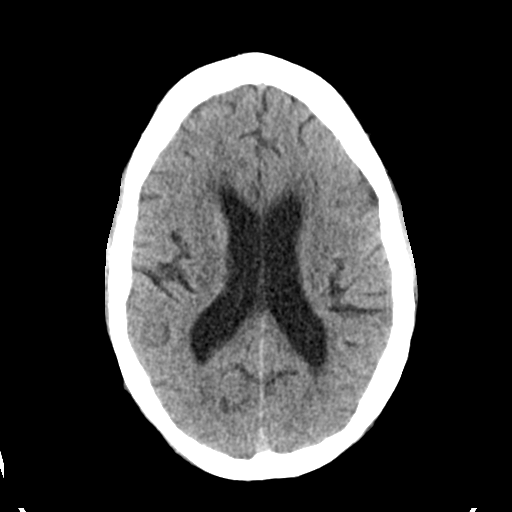
[im 17/29  bone]
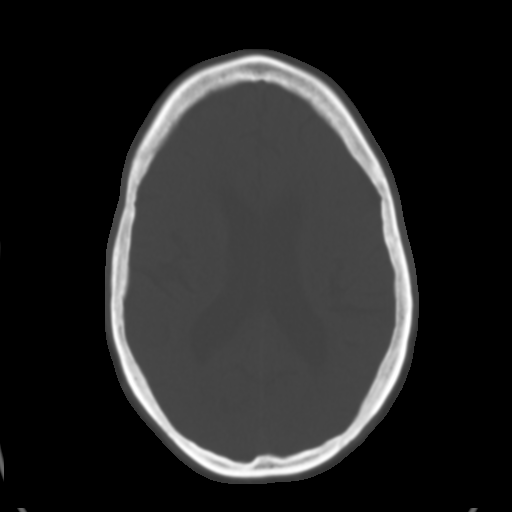
[im 19/29  brain]
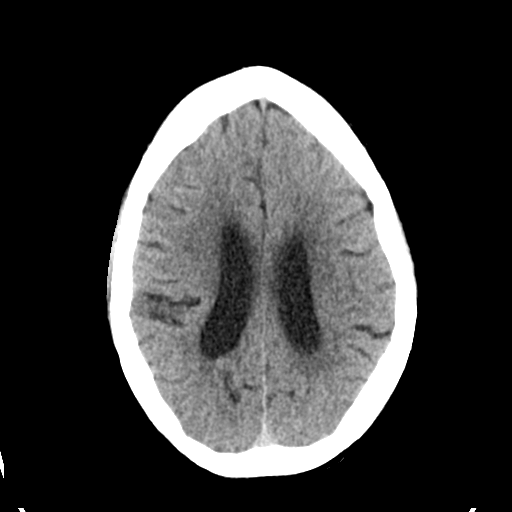
[im 20/29  brain]
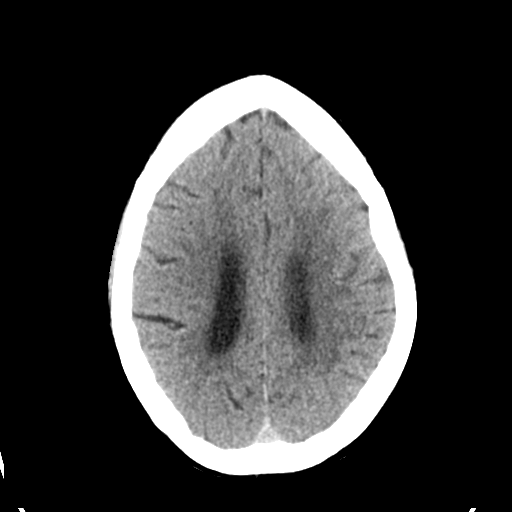
[im 22/29  brain]
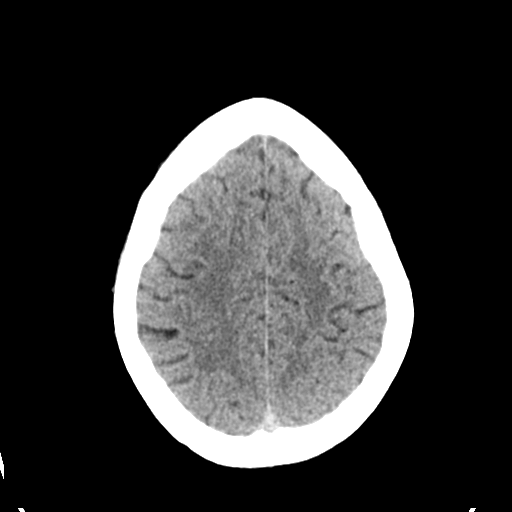
[im 24/29  brain]
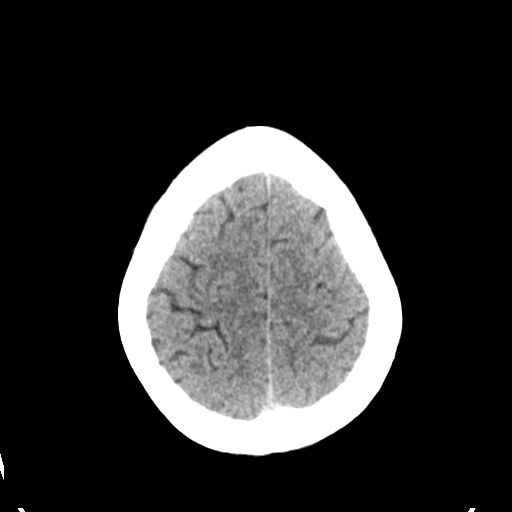
[im 24/29  bone]
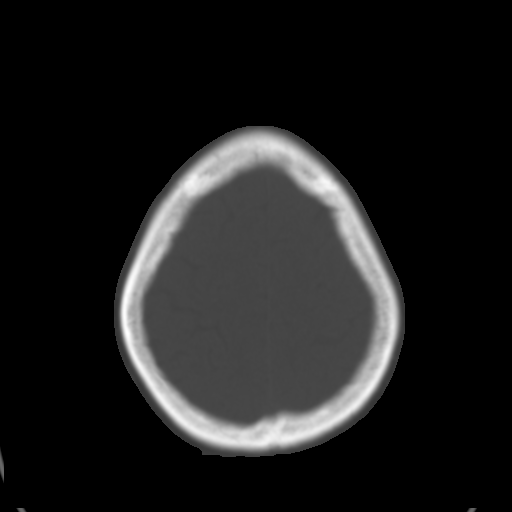
[im 26/29  brain]
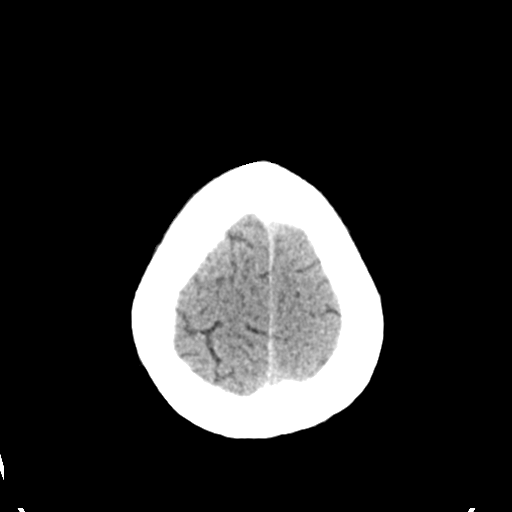
[im 28/29  brain]
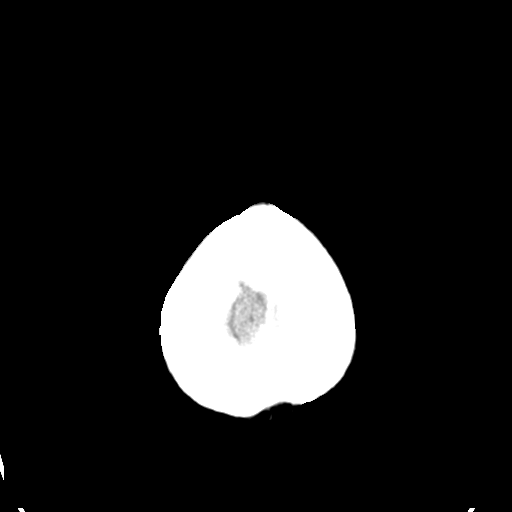

[15 of 29 positions shown; findings below may reference images not displayed]

FINDINGS: Physiologic calcifications in the basal ganglia
bilaterally.  Well defined foci of low attenuation in the left
globus pallidus and head of the right caudate nucleus compatible
with old lacunar infarctions.  Patchy areas of decreased
attenuation throughout the deep and periventricular white matter of
the cerebral hemispheres bilaterally, suggest chronic microvascular
ischemic disease. No acute intracranial abnormalities.
Specifically, no evidence of acute intracranial hemorrhage, no
definite findings of acute/subacute cerebral ischemia, no mass,
mass effect, hydrocephalus or abnormal intra or extra-axial fluid
collections.  Visualized paranasal sinuses and mastoids are well
pneumatized.  No acute displaced skull fractures are identified.
IMPRESSION: 1.  No acute intracranial abnormalities.
2.  Early changes of chronic microvascular ischemic disease in the
cerebral white matter and old lacunar infarctions in the basal
ganglia bilaterally.

## 2015-01-26 NOTE — Discharge Summary (Signed)
[facility] Discharge Summary   Patient ID:  [name] MRN: [id number] DOB/AGE: March 12, 1971 44 y.o.  Admit date: 01/19/2015 Discharge date: 01/23/2015 Date of Surgery: New left thigh AV graft (4-7 mm PTFE graft) Surgeon: Dr. Deitra Mayo  Admission Diagnosis: left common femoral vein DVT  Discharge Diagnoses:  left common femoral vein DVT  Secondary Diagnoses: Past Medical History  Diagnosis Date  . Hyperlipidemia   . Alopecia   . Obesity   . Iron deficiency   . RLS (restless legs syndrome)   . Injury of conjunctiva and corneal abrasion of right eye without foreign body   . Cellulitis   . Anemia   . Blood transfusion 2004    "when I had my transplant"  . Migraine   . Hyperparathyroidism, secondary   . Diabetic retinopathy   . Hypertension     takes Metoprolol and Exforge daily  . Depression     takes Klonopin nightly  . Diabetes mellitus type 1     Pt states diagnosed at age 72 with prior episodes of DKA. S/p pancreatic transplant   . GERD (gastroesophageal reflux disease)     takes Protonix daily  . Bronchitis   . Migraine   . Asthma   . Emphysema of lung   . Angina     none in past 2 years.  . Stomach pain     Hx: of chronic  . Pneumonia 2012    "double"  . Dialysis patient     tues,thurs,sat  . 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  Surgery was done at [facility].     . DKA (diabetic ketoacidoses) 12/01/2014  . Diastolic CHF, acute on chronic 01/02/2015  . Stroke 03/2014    ? TIA  . ESRD (end stage renal disease)     S/p pancreatic and kidney transplant, but back on HD 2008, dialysis at [facility], [place], Sat      Discharged Condition: good  HPI:  [name] is a 44 y.o. female who is being dialyzed with  a graft in her right thigh. This has been very painful however and ultimately We elected to attempt placement of a new graft in the left thigh so that the right thigh graft would not need to be used ultimately. She underwent placement of a new left thigh AV graft on 01/14/2015 with a 4-7 mm PTFE graft. Of note her saphenous vein was small and I elected to make the venous anastomosis into the femoral vein. The femoral vein had some venous webs and was clearly not normal. There was a small amount of old clot within the femoral vein also noted at the time of surgery.   Since discharge, she's had significant left leg pain and increased swelling in the leg. We brought her in today for a duplex scan which shows a clot in the left common femoral vein. She is admitted now for heparin and leg elevation. The graft is still functioning and the clot appears to be below the anastomosis to the vein.  Hospital Course:  [name] is a 43 y.o. female is S/P  Unable to obtain peripheral IV yesterday, therefore unable to start heparin. Patient also refused lovenox. Coumadin was started yesterday. Will ask Dr. Scot Dock about central line  placement. Nephrology consulted for HD (TTS).  Continue to elevate leg above level of the heart. Have patient flat on her back with legs above the heart and knees slightly bent.  ESRD patient recent L thigh AVG placement due to a failing R thigh AVG. Now has LLE DVT. Started on heparin and coumadin. Plan HD tomorrow.  Radiology Placement of a right EJ approach triple lumen catheter. 18cm length.  Findings: Right IJ occluded.  01/20/2015 Assessment/Planning: Left thigh AV graft on 01/14/2015 with a 4-7 mm PTFE graft. DVT LEFT COMMON FEMORAL VEIN: Plan discharge home today she will call her primary care physician tomorrow [name], Gifford Shave in [place] for INR check. She will be discharged home on 2.5 mg coumadin HD T-TH-SAT Disposition stable F/U scheduled with Primary  care for INR follows.   Consults:  Treatment Team:  Roney Jaffe, MD  Significant Diagnostic Studies: CBC Lab Results  Component Value Date   WBC 6.4 01/23/2015   HGB 8.2* 01/23/2015   HCT 25.9* 01/23/2015   MCV 98.5 01/23/2015   PLT 156 01/23/2015    BMET    Component Value Date/Time   NA 129* 01/22/2015 1408   K 6.2* 01/22/2015 1408   CL 94* 01/22/2015 1408   CO2 24 01/22/2015 1408   GLUCOSE 166* 01/22/2015 1408   BUN 60* 01/22/2015 1408   CREATININE 9.90* 01/22/2015 1408   CALCIUM 8.7* 01/22/2015 1408   CALCIUM 8.4 03/06/2011 0751   GFRNONAA 4* 01/22/2015 1408   GFRAA 5* 01/22/2015 1408   COAG Lab Results  Component Value Date   INR 2.06* 01/23/2015   INR 1.94* 01/22/2015   INR 1.43 01/21/2015     Disposition:  Discharge to :Home Discharge Instructions    Call MD for:  redness, tenderness, or signs of infection (pain, swelling, bleeding, redness, odor or green/yellow discharge around incision site)    Complete by:  As directed      Call MD for:  severe or increased pain, loss or decreased feeling  in affected limb(s)    Complete by:  As directed      Call MD for:  temperature >100.5    Complete by:  As directed      Discharge instructions    Complete by:  As directed   Elevate you feet and legs to help decrease swelling when at rest     Driving Restrictions    Complete by:  As directed   No driving while taking narcotics     Increase activity slowly    Complete by:  As directed   Walk with assistance use walker or cane as needed     Lifting restrictions    Complete by:  As directed   No lifting for 6 weeks     Resume previous diet    Complete by:  As directed             Medication List    TAKE these medications        albuterol 108 (90 BASE) MCG/ACT inhaler  Commonly known as:  PROVENTIL HFA;VENTOLIN HFA  Inhale 2 puffs into the lungs every 4 (four) hours as needed for wheezing or shortness of breath (as per home regimen).      amLODipine 10 MG tablet  Commonly known as:  NORVASC  Take 10 mg by mouth at bedtime.     aspirin 325 MG tablet  Take 1 tablet (325 mg total) by mouth daily.     calcium acetate 667 MG  capsule  Commonly known as:  PHOSLO  Take 1 capsule (667 mg total) by mouth 2 (two) times daily.     clonazePAM 0.5 MG tablet  Commonly known as:  KLONOPIN  Take 1 mg by mouth at bedtime.     Darbepoetin Alfa 40 MCG/0.4ML Sosy injection  Commonly known as:  ARANESP  Inject 0.4 mLs (40 mcg total) into the vein every Thursday with hemodialysis.     doxercalciferol 4 MCG/2ML injection  Commonly known as:  HECTOROL  Inject 1 mL (2 mcg total) into the vein Every Tuesday,Thursday,and Saturday with dialysis.     feeding supplement (NEPRO CARB STEADY) Liqd  Take 237 mLs by mouth 2 (two) times daily between meals.     gabapentin 100 MG capsule  Commonly known as:  NEURONTIN  Take 100 mg by mouth daily.     guaiFENesin 600 MG 12 hr tablet  Commonly known as:  MUCINEX  Take 1 tablet (600 mg total) by mouth 2 (two) times daily.     HUMALOG KWIKPEN 100 UNIT/ML KiwkPen  Generic drug:  insulin lispro  Inject 5-6 Units into the skin 2 (two) times daily. Inject 6 units subcutaneously in the morning and 5 units in the evening     insulin glargine 100 UNIT/ML injection  Commonly known as:  LANTUS  Inject 0.07 mLs (7 Units total) into the skin 2 (two) times daily.     lidocaine 5 %  Commonly known as:  LIDODERM  Place 1 patch onto the skin daily. Remove & Discard patch within 12 hours or as directed by MD     metoCLOPramide 10 MG tablet  Commonly known as:  REGLAN  Take 1 tablet (10 mg total) by mouth 4 (four) times daily -  before meals and at bedtime.     metoprolol succinate 50 MG 24 hr tablet  Commonly known as:  TOPROL-XL  Take 1 tablet (50 mg total) by mouth daily. Take with or immediately following a meal.     montelukast 10 MG tablet  Commonly known as:  SINGULAIR  Take 10 mg by mouth at  bedtime.     multivitamin Tabs tablet  Take 1 tablet by mouth daily.     Oxycodone HCl 10 MG Tabs  Take 1 tablet (10 mg total) by mouth 3 (three) times daily as needed (for pain).     Oxycodone HCl 10 MG Tabs  Take 1 tablet (10 mg total) by mouth 3 (three) times daily as needed (for pain).     pantoprazole 40 MG tablet  Commonly known as:  PROTONIX  Take 1 tablet (40 mg total) by mouth at bedtime.     promethazine 25 MG tablet  Commonly known as:  PHENERGAN  Take 1 tablet (25 mg total) by mouth every 6 (six) hours as needed for nausea or vomiting.     simvastatin 20 MG tablet  Commonly known as:  ZOCOR  Take 1 tablet (20 mg total) by mouth at bedtime.     warfarin 5 MG tablet  Commonly known as:  COUMADIN  Take 0.5 tablets (2.5 mg total) by mouth daily at 6 PM.     zolpidem 5 MG tablet  Commonly known as:  AMBIEN  Take 5 mg by mouth at bedtime as needed. sleep       Verbal and written Discharge instructions given to the patient. Wound care per Discharge AVS     Follow-up Information    Follow up with [facility], Elmo Putt  C, MD. Call in 2 days.   Specialty:  Family Medicine   Why:  Patient needs to call for appt   Contact information:   [place] [address] [phone]       Follow up with Deitra Mayo, MD In 4 weeks.   Specialties:  Vascular Surgery, Cardiology   Contact information:   [address] [place] [address] [phone]       Signed: [facility] 01/26/2015, 1:58 PM

## 2015-01-27 ENCOUNTER — Telehealth: Payer: Self-pay

## 2015-01-27 NOTE — Telephone Encounter (Signed)
Rec'd call from nurse at [facility].  Reported pt. is c/o increased pain and swelling in the left thigh. Recently d/c'd from hospital with DVT Left LE.  Pt. d/c'd on Coumadin 2.5 mg qd, and was to f/u with her PCP on 8/29 for INR check and further direction on Coumadin dose.  Nurse @ [place] reported pt. has not followed with her PCP, but reported she continues to take the Coumadin 2.5 mg. Daily.  Discussed pt's symptoms with Dr. Oneida Alar.  Recommended that pt. F/u for her Coumadin management.  No further orders at this time.  Nurse at [facility] informed of Dr. Oneida Alar recommendations and will inform pt.

## 2015-02-05 IMAGING — CR DG CHEST 2V
2 series · 2 of 2 positions shown · non-contrast
Comparison: January 02, 2013

CLINICAL DATA: Fever and cough

CHEST - 2 VIEW

[w chest pa]
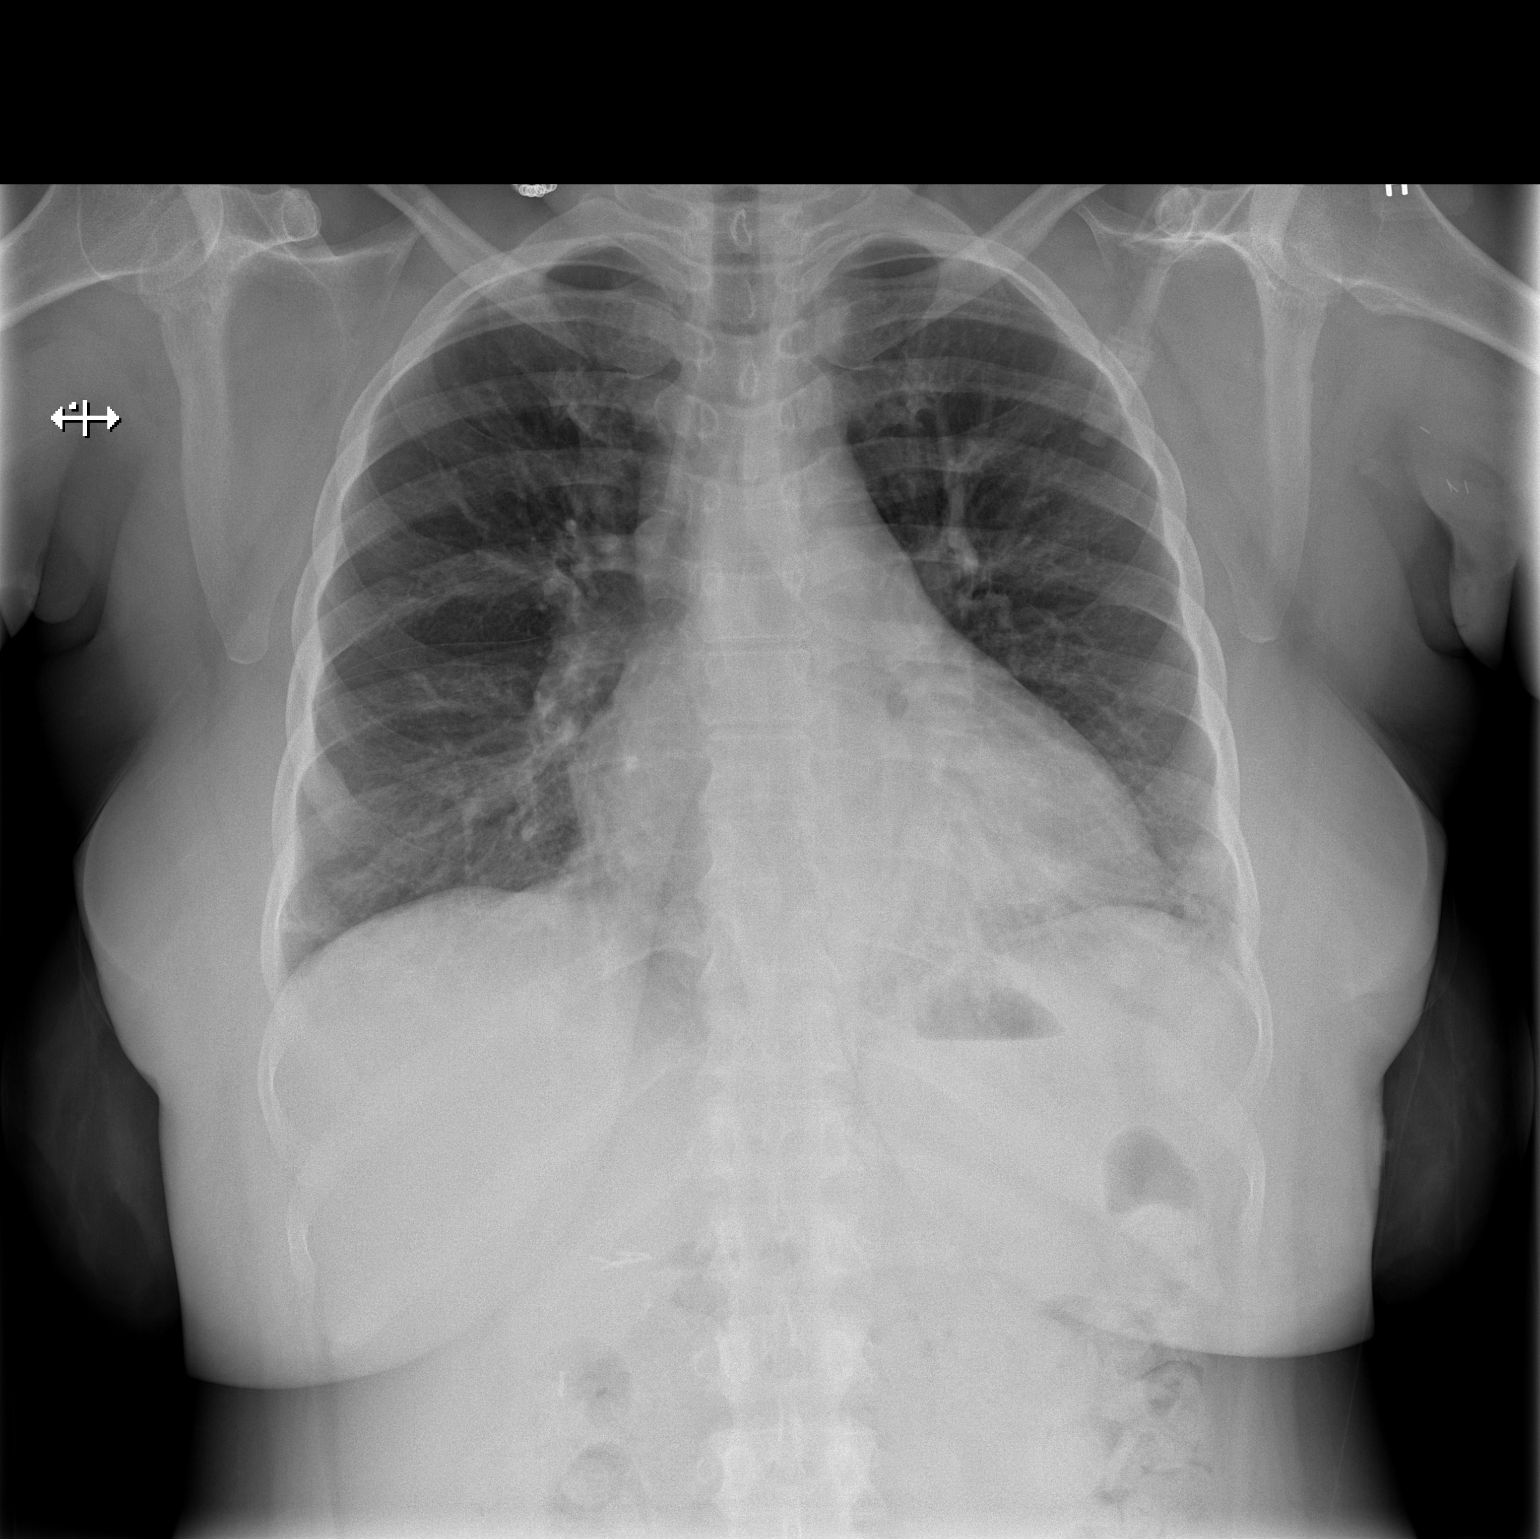

[w chest lat]
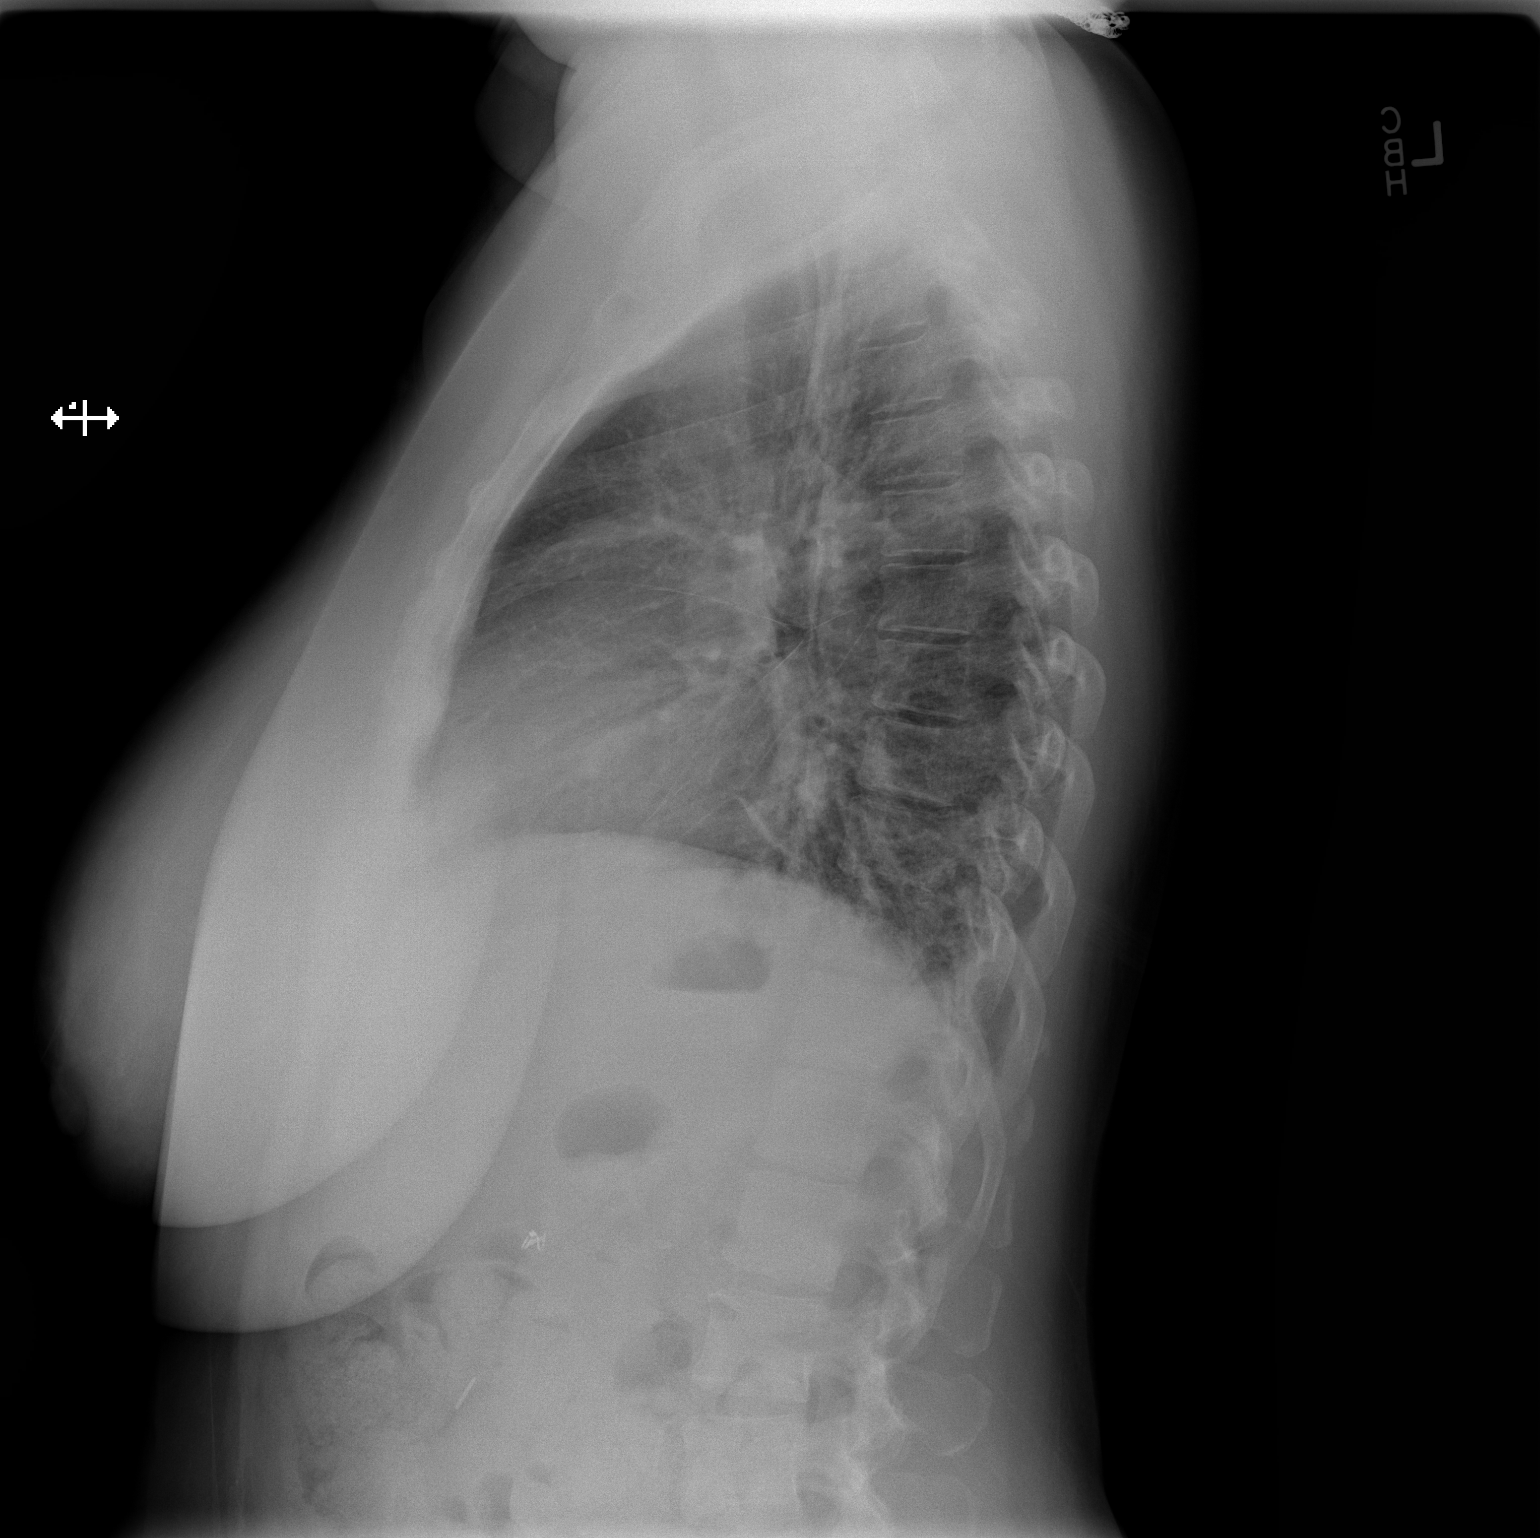

[2 of 2 positions shown; findings below may reference images not displayed]

FINDINGS: There is a small area of infiltrate in the posterior
lateral left base.  Lungs are otherwise clear.  Heart is upper
normal in size with normal pulmonary vascularity.  No adenopathy.
No bone lesions.
IMPRESSION: Small of infiltrate posterior lateral left base.

## 2015-02-08 ENCOUNTER — Encounter: Payer: Self-pay | Admitting: Vascular Surgery

## 2015-02-09 ENCOUNTER — Ambulatory Visit (INDEPENDENT_AMBULATORY_CARE_PROVIDER_SITE_OTHER): Payer: Self-pay | Admitting: Vascular Surgery

## 2015-02-09 ENCOUNTER — Encounter: Payer: Self-pay | Admitting: Vascular Surgery

## 2015-02-09 VITALS — BP 168/67 | HR 91 | Temp 98.0°F | Ht 61.5 in | Wt 151.8 lb

## 2015-02-09 DIAGNOSIS — Z992 Dependence on renal dialysis: Secondary | ICD-10-CM

## 2015-02-09 DIAGNOSIS — N186 End stage renal disease: Secondary | ICD-10-CM

## 2015-02-09 MED ORDER — OXYCODONE-ACETAMINOPHEN 5-325 MG PO TABS: 1.0000 | ORAL_TABLET | Freq: Every evening | ORAL | Status: DC | PRN

## 2015-02-09 NOTE — Progress Notes (Signed)
POST OPERATIVE OFFICE NOTE    CC:  F/u for surgery  HPI:  This is a 44 y.o. female who is s/p Left thigh AV graft.  She states she is having pain in the whole left leg and bruising at the tunnel site.  He largest complaint is trouble sleeping due to the pain.  She reports no fevers or chills.  She has not had edema or decreased sensation in the right LE.  No signs of steal.  She is currently on HD using her right thigh graft.  Allergies  Allergen Reactions  . Depakote [Divalproex Sodium] Other (See Comments)    Pt gets "delirious"  . Morphine And Related Nausea And Vomiting and Other (See Comments)    "makes me delirious"  . Penicillins Anaphylaxis    Tolerated Zosyn Dec 2014  . Tramadol Nausea And Vomiting  . Imitrex [Sumatriptan] Other (See Comments)    seizures  . Vicodin [Hydrocodone-Acetaminophen] Itching and Rash    Current Outpatient Prescriptions  Medication Sig Dispense Refill  . albuterol (PROVENTIL HFA;VENTOLIN HFA) 108 (90 BASE) MCG/ACT inhaler Inhale 2 puffs into the lungs every 4 (four) hours as needed for wheezing or shortness of breath (as per home regimen). 1 Inhaler 0  . aspirin 325 MG tablet Take 1 tablet (325 mg total) by mouth daily. 30 tablet 0  . calcium acetate (PHOSLO) 667 MG capsule Take 1 capsule (667 mg total) by mouth 2 (two) times daily. (Patient taking differently: Take 2,001 mg by mouth 3 (three) times daily with meals. ) 30 capsule 0  . clonazePAM (KLONOPIN) 0.5 MG tablet Take 1 mg by mouth at bedtime.     . Darbepoetin Alfa (ARANESP) 40 MCG/0.4ML SOSY injection Inject 0.4 mLs (40 mcg total) into the vein every Thursday with hemodialysis. 8.4 mL   . doxercalciferol (HECTOROL) 4 MCG/2ML injection Inject 1 mL (2 mcg total) into the vein Every Tuesday,Thursday,and Saturday with dialysis. 2 mL   . gabapentin (NEURONTIN) 100 MG capsule Take 100 mg by mouth daily.     Marland Kitchen guaiFENesin (MUCINEX) 600 MG 12 hr tablet Take 1 tablet (600 mg total) by mouth 2 (two)  times daily. 30 tablet 0  . insulin glargine (LANTUS) 100 UNIT/ML injection Inject 0.07 mLs (7 Units total) into the skin 2 (two) times daily. 10 mL 11  . insulin lispro (HUMALOG KWIKPEN) 100 UNIT/ML KiwkPen Inject 5-6 Units into the skin 2 (two) times daily. Inject 6 units subcutaneously in the morning and 5 units in the evening    . lidocaine (LIDODERM) 5 % Place 1 patch onto the skin daily. Remove & Discard patch within 12 hours or as directed by MD 10 patch 0  . metoCLOPramide (REGLAN) 10 MG tablet Take 1 tablet (10 mg total) by mouth 4 (four) times daily -  before meals and at bedtime. 60 tablet 0  . montelukast (SINGULAIR) 10 MG tablet Take 10 mg by mouth at bedtime.    . multivitamin (RENA-VIT) TABS tablet Take 1 tablet by mouth daily. 30 tablet 0  . Nutritional Supplements (FEEDING SUPPLEMENT, NEPRO CARB STEADY,) LIQD Take 237 mLs by mouth 2 (two) times daily between meals. 60 Can 0  . pantoprazole (PROTONIX) 40 MG tablet Take 1 tablet (40 mg total) by mouth at bedtime. (Patient taking differently: Take 40 mg by mouth daily. Takes daily in the afternoon) 30 tablet 0  . promethazine (PHENERGAN) 25 MG tablet Take 1 tablet (25 mg total) by mouth every 6 (six) hours as needed  for nausea or vomiting. 10 tablet 0  . simvastatin (ZOCOR) 20 MG tablet Take 1 tablet (20 mg total) by mouth at bedtime. 30 tablet 0  . warfarin (COUMADIN) 5 MG tablet Take 0.5 tablets (2.5 mg total) by mouth daily at 6 PM. 60 tablet 0  . zolpidem (AMBIEN) 5 MG tablet Take 5 mg by mouth at bedtime as needed. sleep    . amLODipine (NORVASC) 10 MG tablet Take 10 mg by mouth at bedtime.   11  . metoprolol succinate (TOPROL-XL) 50 MG 24 hr tablet Take 1 tablet (50 mg total) by mouth daily. Take with or immediately following a meal. (Patient not taking: Reported on 02/09/2015) 30 tablet 1  . oxyCODONE-acetaminophen (PERCOCET/ROXICET) 5-325 MG per tablet Take 1-2 tablets by mouth at bedtime as needed. 40 tablet 0   No current  facility-administered medications for this visit.     ROS:  See HPI  Physical Exam:  Filed Vitals:   02/09/15 1348  BP: 168/67  Pulse: 91  Temp: 98 F (36.7 C)    Incision:  Well healed Extremities:  Palpable DP biphasic, no edema, and compartments soft in the thigh Neuro: sensation and motor intact grossly normal.  Tenderness to palpation aound the thigh graft to touch  Assessment/Plan:  This is a 44 y.o. female who is s/p: Left thigh av graft placement 4 weeks post-op.  She is requesting the left graft keeps her awake at night and she wants it taken out.  She states she is not having trouble with the right thigh graft now.  We discussed that her left thigh graft is the last resort for HD access and that she should give the graft more time to heal and for the pain to subside.  She was prescribed percocet 5/325 1-2 prn QHS today a count of 40.  She will f/u in 3 weeks with Dr. Scot Dock.   Theda Sers, Aleera Gilcrease MAUREEN PA-C [facility]   Clinic MD:  Pt seen and examined with Dr. Scot Dock

## 2015-02-28 ENCOUNTER — Encounter: Payer: Self-pay | Admitting: Vascular Surgery

## 2015-03-02 ENCOUNTER — Encounter: Payer: Self-pay | Admitting: Vascular Surgery

## 2015-03-02 ENCOUNTER — Ambulatory Visit (INDEPENDENT_AMBULATORY_CARE_PROVIDER_SITE_OTHER): Payer: Medicare Other | Admitting: Vascular Surgery

## 2015-03-02 VITALS — BP 138/56 | HR 90 | Ht 61.5 in | Wt 154.0 lb

## 2015-03-02 DIAGNOSIS — N185 Chronic kidney disease, stage 5: Secondary | ICD-10-CM

## 2015-03-02 MED ORDER — OXYCODONE-ACETAMINOPHEN 5-325 MG PO TABS: 1.0000 | ORAL_TABLET | Freq: Four times a day (QID) | ORAL | Status: AC | PRN

## 2015-03-02 NOTE — Progress Notes (Signed)
Patient name: [name] MRN: [id number] DOB: [birth year] Sex: female  REASON FOR VISIT: Follow up of new left thigh AV graft.  HPI: [name] is a 44 y.o. female who had a new left thigh AV graft placed on 01/14/2015. She still uses her right thigh graft but the right thigh has always been very painful which is why we elected to place a graft on the left hopes of eventually not having to use the right anymore. She has had persistent pain in the left thigh since her surgery. When I saw her last she wanted to have the graft removed but I talked her out of this. She denies fever or chills. The pain is only in the thigh and she has no pain in her foot or lower leg on the left.  Current Outpatient Prescriptions  Medication Sig Dispense Refill  . albuterol (PROVENTIL HFA;VENTOLIN HFA) 108 (90 BASE) MCG/ACT inhaler Inhale 2 puffs into the lungs every 4 (four) hours as needed for wheezing or shortness of breath (as per home regimen). 1 Inhaler 0  . amLODipine (NORVASC) 10 MG tablet Take 10 mg by mouth at bedtime.   11  . aspirin 325 MG tablet Take 1 tablet (325 mg total) by mouth daily. 30 tablet 0  . calcium acetate (PHOSLO) 667 MG capsule Take 1 capsule (667 mg total) by mouth 2 (two) times daily. (Patient taking differently: Take 2,001 mg by mouth 3 (three) times daily with meals. ) 30 capsule 0  . clonazePAM (KLONOPIN) 0.5 MG tablet Take 1 mg by mouth at bedtime.     . Darbepoetin Alfa (ARANESP) 40 MCG/0.4ML SOSY injection Inject 0.4 mLs (40 mcg total) into the vein every Thursday with hemodialysis. 8.4 mL   . doxercalciferol (HECTOROL) 4 MCG/2ML injection Inject 1 mL (2 mcg total) into the vein Every Tuesday,Thursday,and Saturday with dialysis. 2 mL   . gabapentin (NEURONTIN) 100 MG capsule Take 100 mg by mouth daily.     Marland Kitchen guaiFENesin (MUCINEX) 600 MG 12 hr tablet Take 1 tablet (600 mg total) by mouth 2 (two) times daily. 30 tablet 0  . insulin glargine (LANTUS) 100 UNIT/ML injection  Inject 0.07 mLs (7 Units total) into the skin 2 (two) times daily. 10 mL 11  . insulin lispro (HUMALOG KWIKPEN) 100 UNIT/ML KiwkPen Inject 5-6 Units into the skin 2 (two) times daily. Inject 6 units subcutaneously in the morning and 5 units in the evening    . lidocaine (LIDODERM) 5 % Place 1 patch onto the skin daily. Remove & Discard patch within 12 hours or as directed by MD 10 patch 0  . metoCLOPramide (REGLAN) 10 MG tablet Take 1 tablet (10 mg total) by mouth 4 (four) times daily -  before meals and at bedtime. 60 tablet 0  . metoprolol succinate (TOPROL-XL) 50 MG 24 hr tablet Take 1 tablet (50 mg total) by mouth daily. Take with or immediately following a meal. 30 tablet 1  . montelukast (SINGULAIR) 10 MG tablet Take 10 mg by mouth at bedtime.    . multivitamin (RENA-VIT) TABS tablet Take 1 tablet by mouth daily. 30 tablet 0  . Nutritional Supplements (FEEDING SUPPLEMENT, NEPRO CARB STEADY,) LIQD Take 237 mLs by mouth 2 (two) times daily between meals. 60 Can 0  . oxyCODONE-acetaminophen (PERCOCET/ROXICET) 5-325 MG per tablet Take 1-2 tablets by mouth at bedtime as needed. 40 tablet 0  . pantoprazole (PROTONIX) 40 MG tablet Take 1 tablet (40 mg total) by mouth at  bedtime. (Patient taking differently: Take 40 mg by mouth daily. Takes daily in the afternoon) 30 tablet 0  . promethazine (PHENERGAN) 25 MG tablet Take 1 tablet (25 mg total) by mouth every 6 (six) hours as needed for nausea or vomiting. 10 tablet 0  . simvastatin (ZOCOR) 20 MG tablet Take 1 tablet (20 mg total) by mouth at bedtime. 30 tablet 0  . warfarin (COUMADIN) 5 MG tablet Take 0.5 tablets (2.5 mg total) by mouth daily at 6 PM. 60 tablet 0  . zolpidem (AMBIEN) 5 MG tablet Take 5 mg by mouth at bedtime as needed. sleep     No current facility-administered medications for this visit.   REVIEW OF SYSTEMS: Valu.Nieves ] denotes positive finding; [  ] denotes negative finding  CARDIOVASCULAR:  [ ]  chest pain   [ ]  dyspnea on exertion      CONSTITUTIONAL:  [ ]  fever   [ ]  chills  PHYSICAL EXAM: Filed Vitals:   03/02/15 1408  BP: 138/56  Pulse: 90  Height: 5' 1.5" (1.562 m)  Weight: 154 lb (69.854 kg)  SpO2: 100%   GENERAL: The patient is a well-nourished female, in no acute distress. The vital signs are documented above. CARDIOVASCULAR: There is a regular rate and rhythm. PULMONARY: There is good air exchange bilaterally without wheezing or rales. On exam, the graft has an excellent thrill and bruit. There is no cellulitis or drainage. Her incisions are healed nicely.  MEDICAL ISSUES:  STATUS POST NEW LEFT THIGH AV GRAFT: Her graft is working well and should be able to be used for dialysis at any point. I do not see any evidence of infection or reaction to the graft. Her exam is completely unremarkable. She is agreeable to continue to follow this. I have given her prescription for 30 Percocet for pain. I will see her back as needed.  Deitra Mayo [facility] [place]: [phone]

## 2015-03-11 IMAGING — CR DG CHEST 2V
3 series · 3 of 3 positions shown · non-contrast
Comparison: PA and lateral chest 01/14/2013.

CLINICAL DATA: Vomiting and weakness. Headache.

EXAM:
CHEST  2 VIEW

[w chest pa (1 of 2)]
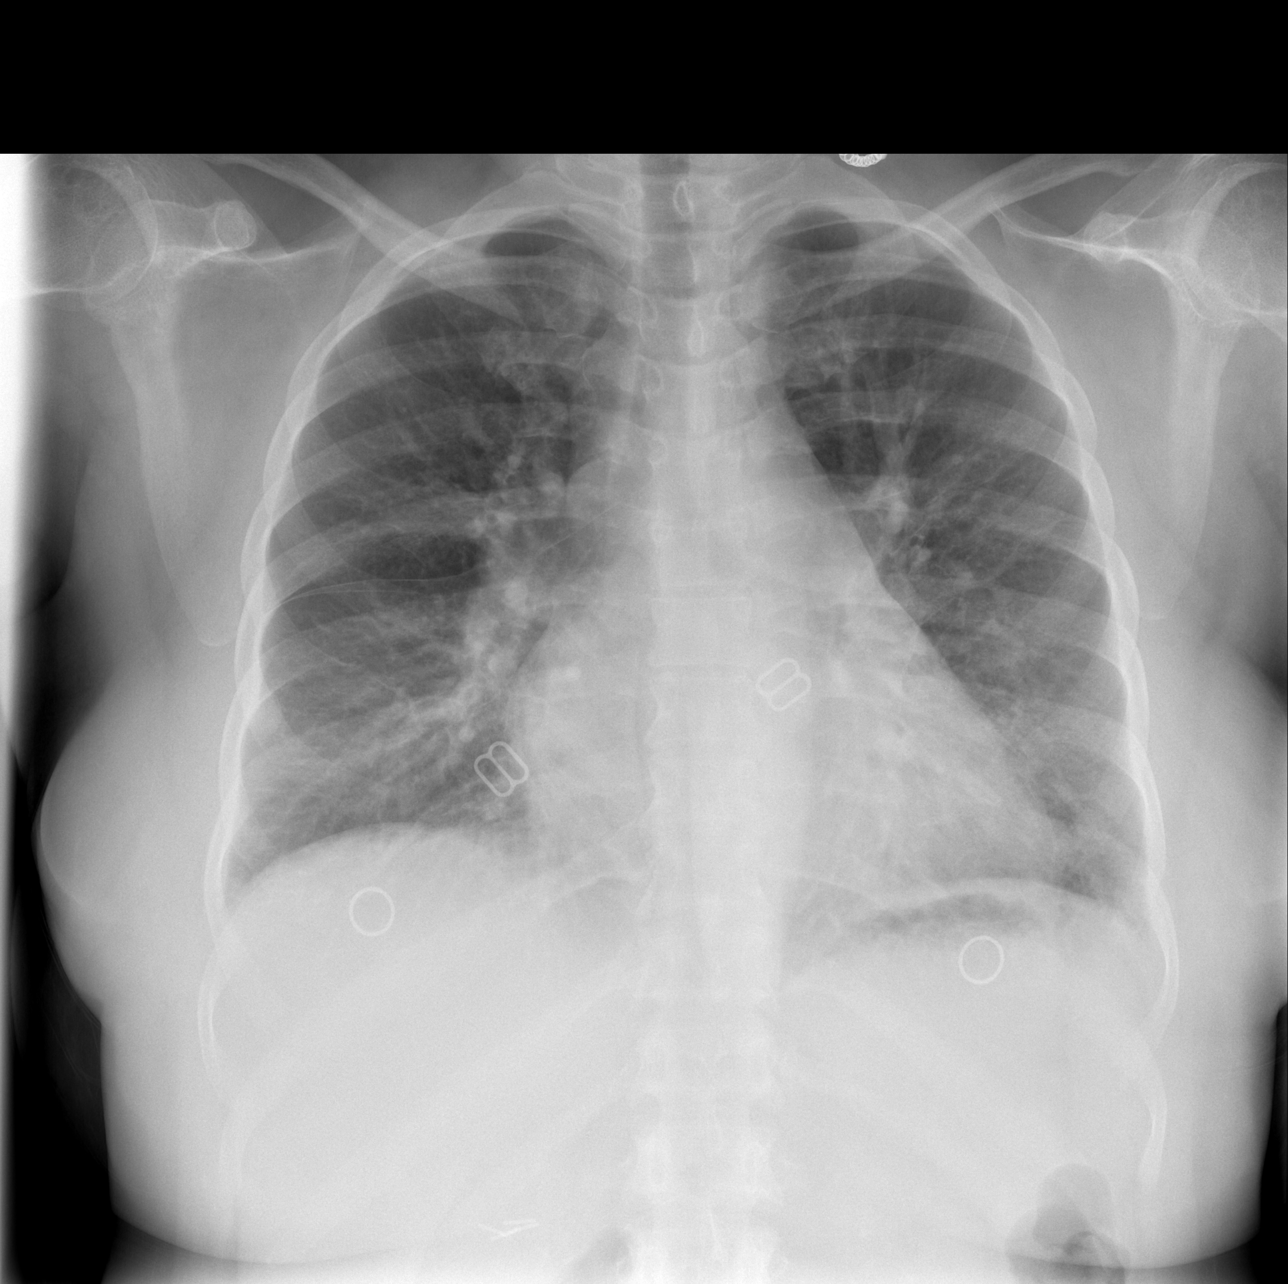

[w chest pa (2 of 2)]
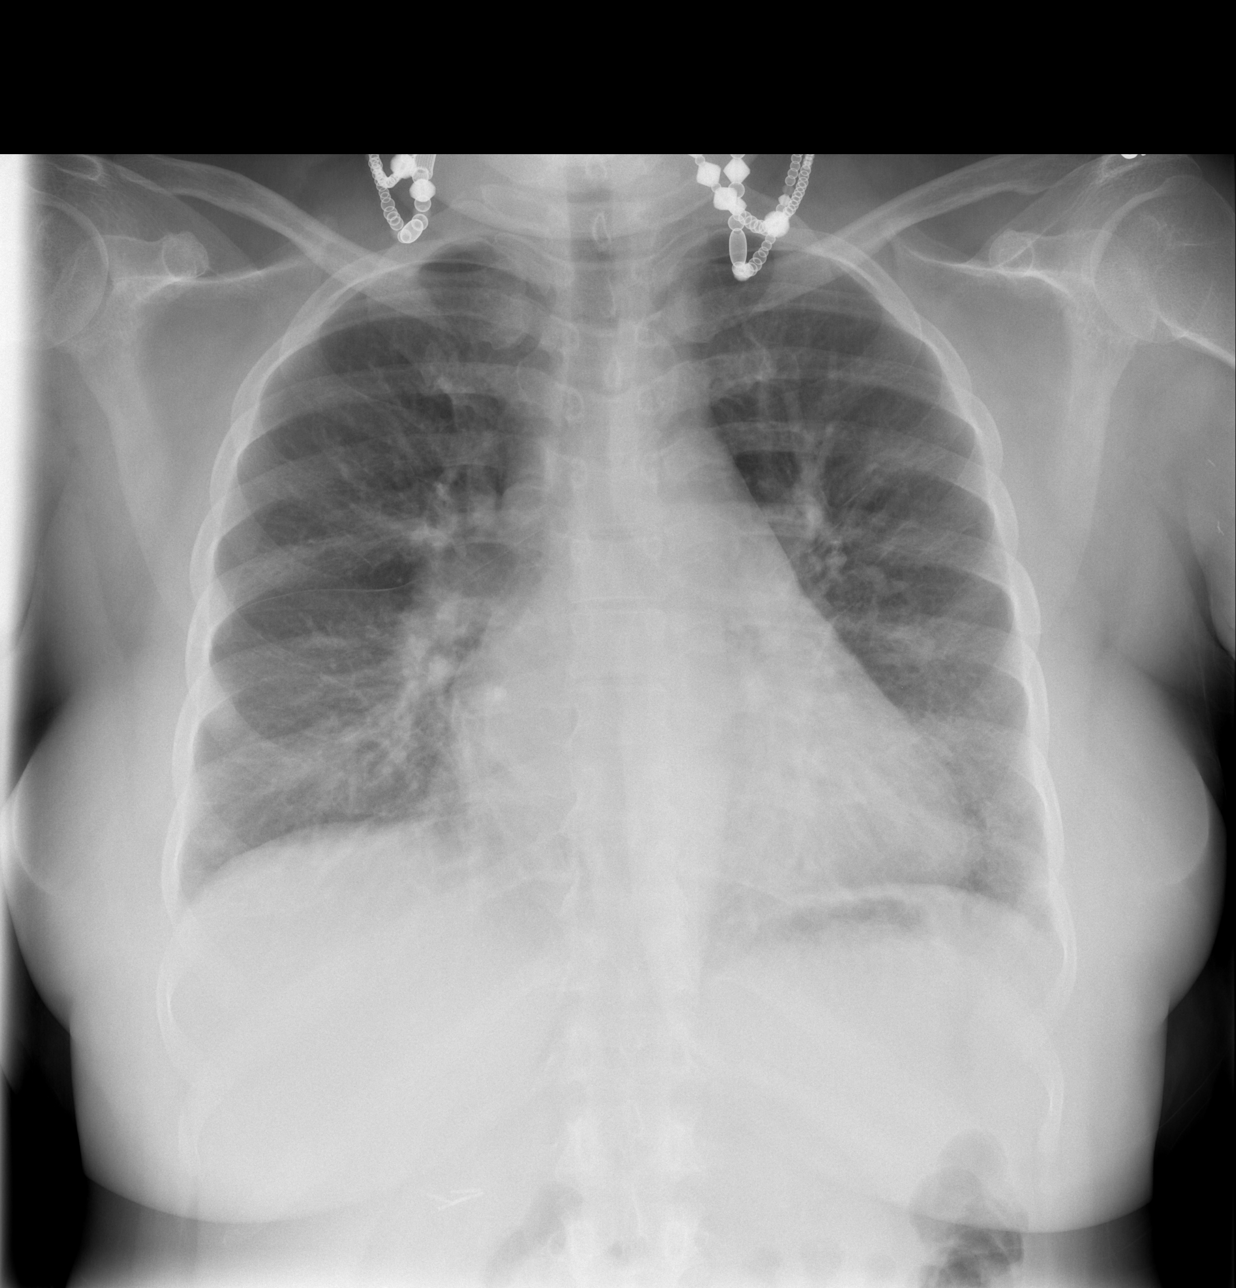

[w chest lat]
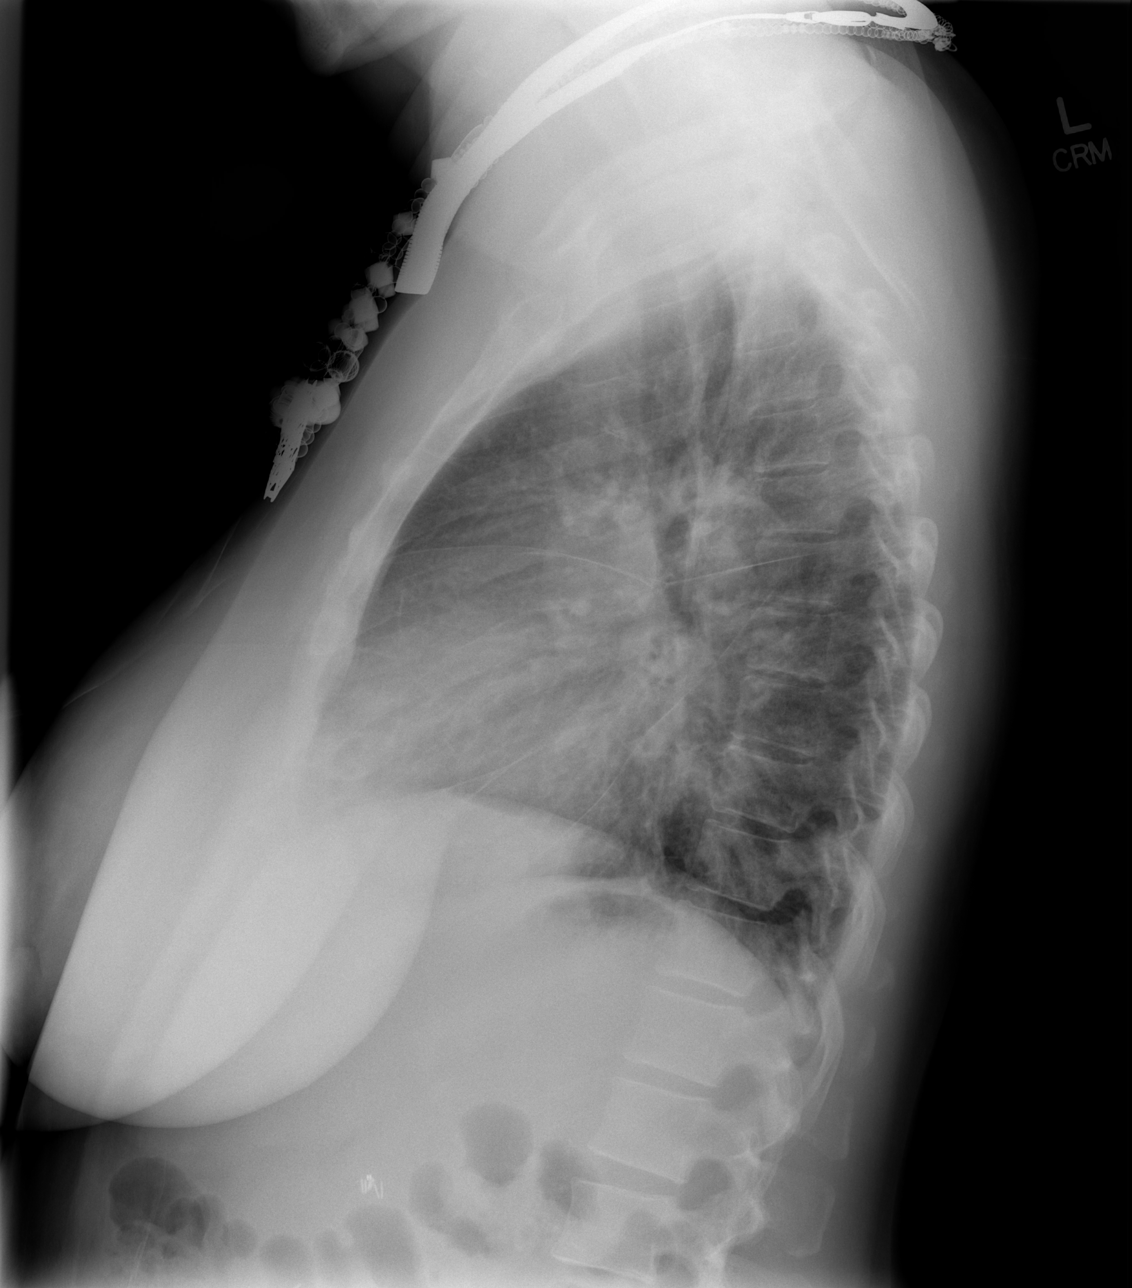

[3 of 3 positions shown; findings below may reference images not displayed]

FINDINGS: There is cardiomegaly and vascular congestion. Fairly extensive
peribronchial thickening is present. No consolidation is seen. No
pneumothorax or pleural fluid is identified.
IMPRESSION: Bronchitic change.

Cardiomegaly and vascular congestion.

## 2015-03-13 IMAGING — CR DG CHEST 1V PORT
1 series · 1 of 1 positions shown · non-contrast
Comparison: PA and lateral chest 02/17/2013 and [DATE].

CLINICAL DATA: Congestion. A infiltrates.

EXAM:
PORTABLE CHEST - 1 VIEW

[AP]
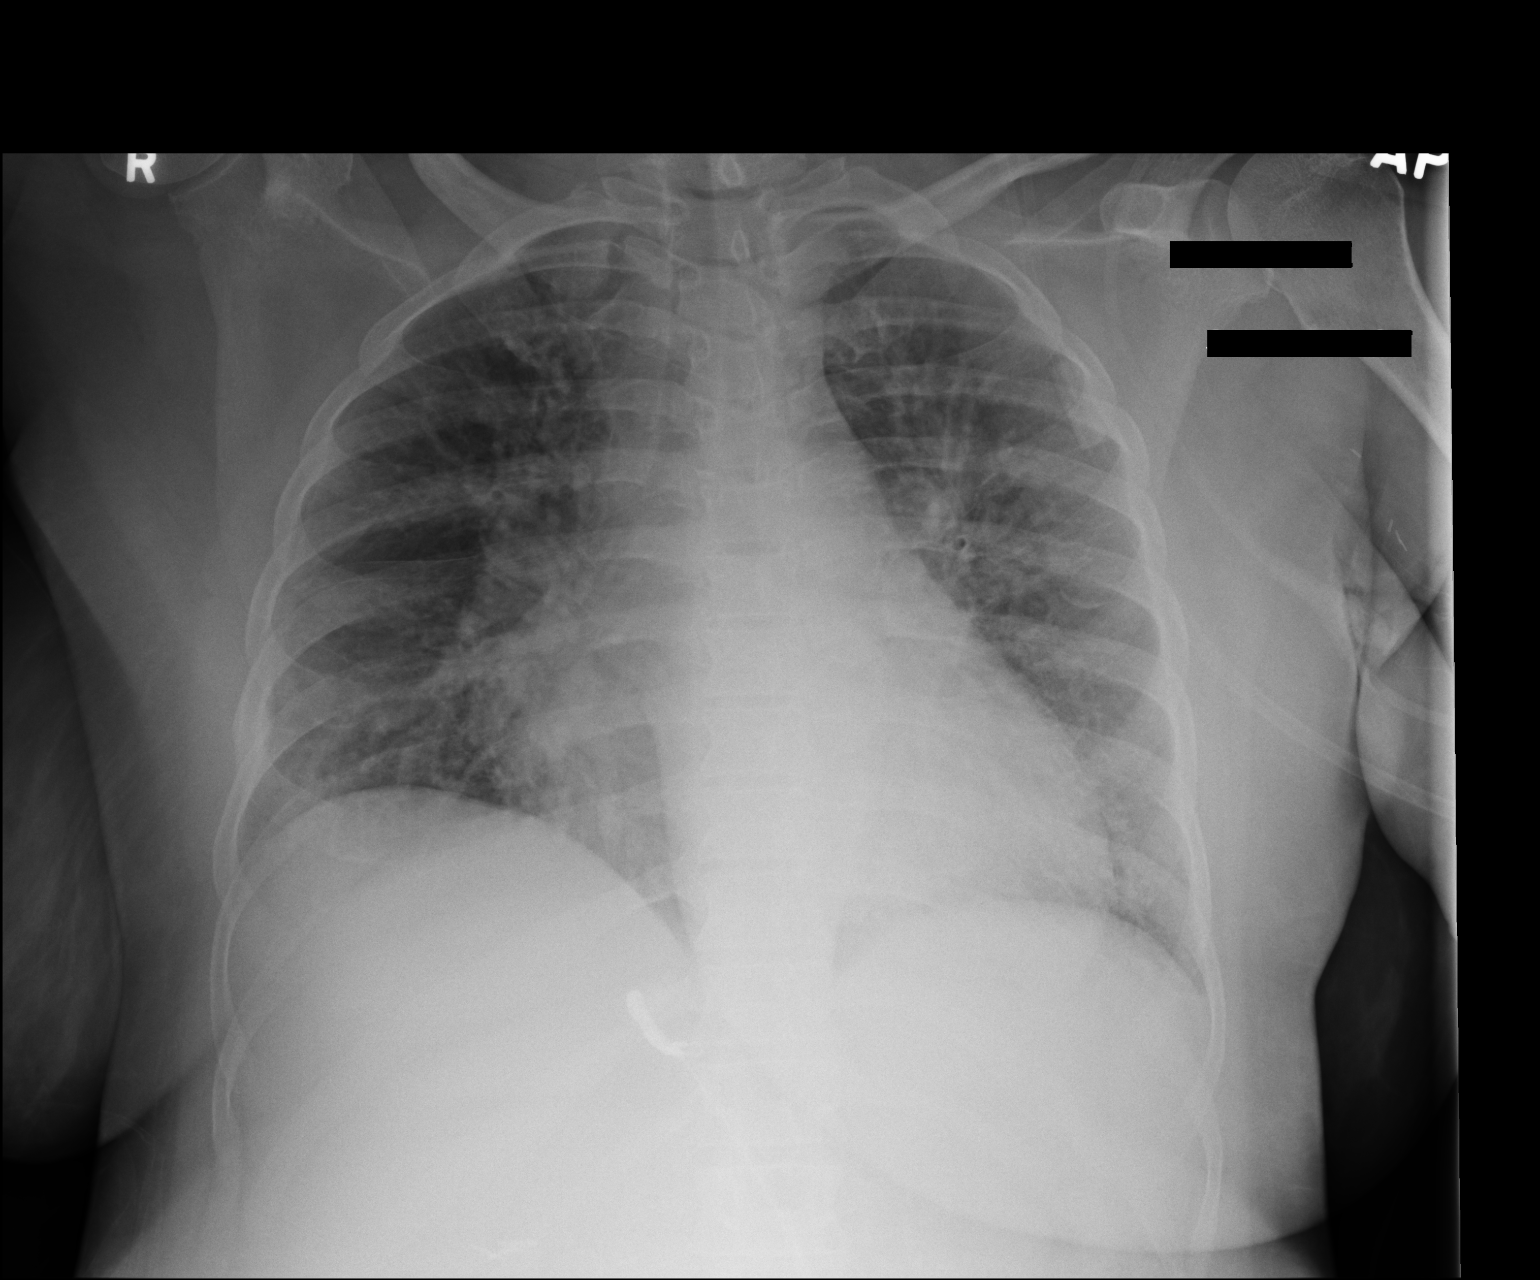

[1 of 1 positions shown; findings below may reference images not displayed]

FINDINGS: There is cardiomegaly and mild interstitial edema. No pneumothorax
or pleural fluid.
IMPRESSION: Cardiomegaly and mild interstitial edema.

## 2015-03-30 ENCOUNTER — Encounter (HOSPITAL_COMMUNITY): Payer: Self-pay | Admitting: *Deleted

## 2015-03-30 ENCOUNTER — Encounter (HOSPITAL_COMMUNITY): Payer: Self-pay | Admitting: Emergency Medicine

## 2015-03-30 ENCOUNTER — Inpatient Hospital Stay (HOSPITAL_COMMUNITY)
Admission: EM | Admit: 2015-03-30 | Discharge: 2015-04-12 | DRG: 802 | Disposition: A | Discharge status: Home / Self Care | Payer: Medicare Other | Attending: Internal Medicine | Admitting: Internal Medicine

## 2015-03-30 ENCOUNTER — Emergency Department (INDEPENDENT_AMBULATORY_CARE_PROVIDER_SITE_OTHER)
Admission: EM | Admit: 2015-03-30 | Discharge: 2015-03-30 | Disposition: A | Discharge status: Other Acute Inpatient Hospital | Payer: Medicare Other | Source: Home / Self Care | Attending: Family Medicine | Admitting: Family Medicine

## 2015-03-30 DIAGNOSIS — E785 Hyperlipidemia, unspecified: Secondary | ICD-10-CM | POA: Diagnosis present

## 2015-03-30 DIAGNOSIS — K224 Dyskinesia of esophagus: Secondary | ICD-10-CM | POA: Diagnosis present

## 2015-03-30 DIAGNOSIS — I132 Hypertensive heart and chronic kidney disease with heart failure and with stage 5 chronic kidney disease, or end stage renal disease: Secondary | ICD-10-CM | POA: Diagnosis present

## 2015-03-30 DIAGNOSIS — Z7982 Long term (current) use of aspirin: Secondary | ICD-10-CM

## 2015-03-30 DIAGNOSIS — E1065 Type 1 diabetes mellitus with hyperglycemia: Secondary | ICD-10-CM | POA: Diagnosis present

## 2015-03-30 DIAGNOSIS — J159 Unspecified bacterial pneumonia: Secondary | ICD-10-CM | POA: Diagnosis not present

## 2015-03-30 DIAGNOSIS — R911 Solitary pulmonary nodule: Secondary | ICD-10-CM | POA: Diagnosis present

## 2015-03-30 DIAGNOSIS — R590 Localized enlarged lymph nodes: Secondary | ICD-10-CM | POA: Diagnosis not present

## 2015-03-30 DIAGNOSIS — Y830 Surgical operation with transplant of whole organ as the cause of abnormal reaction of the patient, or of later complication, without mention of misadventure at the time of the procedure: Secondary | ICD-10-CM | POA: Diagnosis present

## 2015-03-30 DIAGNOSIS — N2581 Secondary hyperparathyroidism of renal origin: Secondary | ICD-10-CM | POA: Diagnosis present

## 2015-03-30 DIAGNOSIS — Z86718 Personal history of other venous thrombosis and embolism: Secondary | ICD-10-CM

## 2015-03-30 DIAGNOSIS — J9811 Atelectasis: Secondary | ICD-10-CM | POA: Diagnosis present

## 2015-03-30 DIAGNOSIS — E10649 Type 1 diabetes mellitus with hypoglycemia without coma: Secondary | ICD-10-CM | POA: Diagnosis present

## 2015-03-30 DIAGNOSIS — K089 Disorder of teeth and supporting structures, unspecified: Secondary | ICD-10-CM

## 2015-03-30 DIAGNOSIS — R059 Cough, unspecified: Secondary | ICD-10-CM

## 2015-03-30 DIAGNOSIS — K219 Gastro-esophageal reflux disease without esophagitis: Secondary | ICD-10-CM | POA: Diagnosis present

## 2015-03-30 DIAGNOSIS — Z940 Kidney transplant status: Secondary | ICD-10-CM

## 2015-03-30 DIAGNOSIS — M542 Cervicalgia: Secondary | ICD-10-CM

## 2015-03-30 DIAGNOSIS — Z992 Dependence on renal dialysis: Secondary | ICD-10-CM

## 2015-03-30 DIAGNOSIS — N186 End stage renal disease: Secondary | ICD-10-CM | POA: Diagnosis present

## 2015-03-30 DIAGNOSIS — R509 Fever, unspecified: Secondary | ICD-10-CM

## 2015-03-30 DIAGNOSIS — R591 Generalized enlarged lymph nodes: Secondary | ICD-10-CM

## 2015-03-30 DIAGNOSIS — E10319 Type 1 diabetes mellitus with unspecified diabetic retinopathy without macular edema: Secondary | ICD-10-CM | POA: Diagnosis present

## 2015-03-30 DIAGNOSIS — C8591 Non-Hodgkin lymphoma, unspecified, lymph nodes of head, face, and neck: Secondary | ICD-10-CM | POA: Diagnosis present

## 2015-03-30 DIAGNOSIS — E1022 Type 1 diabetes mellitus with diabetic chronic kidney disease: Secondary | ICD-10-CM | POA: Diagnosis present

## 2015-03-30 DIAGNOSIS — Z9483 Pancreas transplant status: Secondary | ICD-10-CM

## 2015-03-30 DIAGNOSIS — I959 Hypotension, unspecified: Secondary | ICD-10-CM | POA: Diagnosis not present

## 2015-03-30 DIAGNOSIS — Z794 Long term (current) use of insulin: Secondary | ICD-10-CM

## 2015-03-30 DIAGNOSIS — D638 Anemia in other chronic diseases classified elsewhere: Secondary | ICD-10-CM | POA: Diagnosis present

## 2015-03-30 DIAGNOSIS — T8612 Kidney transplant failure: Secondary | ICD-10-CM | POA: Diagnosis present

## 2015-03-30 DIAGNOSIS — G4700 Insomnia, unspecified: Secondary | ICD-10-CM | POA: Diagnosis not present

## 2015-03-30 DIAGNOSIS — E875 Hyperkalemia: Secondary | ICD-10-CM | POA: Diagnosis present

## 2015-03-30 DIAGNOSIS — Z7901 Long term (current) use of anticoagulants: Secondary | ICD-10-CM

## 2015-03-30 DIAGNOSIS — R7881 Bacteremia: Secondary | ICD-10-CM

## 2015-03-30 DIAGNOSIS — B957 Other staphylococcus as the cause of diseases classified elsewhere: Secondary | ICD-10-CM | POA: Diagnosis present

## 2015-03-30 DIAGNOSIS — E46 Unspecified protein-calorie malnutrition: Secondary | ICD-10-CM | POA: Diagnosis present

## 2015-03-30 DIAGNOSIS — I5032 Chronic diastolic (congestive) heart failure: Secondary | ICD-10-CM | POA: Diagnosis present

## 2015-03-30 DIAGNOSIS — Z6828 Body mass index (BMI) 28.0-28.9, adult: Secondary | ICD-10-CM

## 2015-03-30 DIAGNOSIS — G2581 Restless legs syndrome: Secondary | ICD-10-CM | POA: Diagnosis present

## 2015-03-30 DIAGNOSIS — I890 Lymphedema, not elsewhere classified: Secondary | ICD-10-CM

## 2015-03-30 DIAGNOSIS — E1029 Type 1 diabetes mellitus with other diabetic kidney complication: Secondary | ICD-10-CM | POA: Diagnosis present

## 2015-03-30 DIAGNOSIS — R188 Other ascites: Secondary | ICD-10-CM

## 2015-03-30 DIAGNOSIS — R05 Cough: Secondary | ICD-10-CM

## 2015-03-30 DIAGNOSIS — F329 Major depressive disorder, single episode, unspecified: Secondary | ICD-10-CM | POA: Diagnosis present

## 2015-03-30 LAB — CBC WITH DIFFERENTIAL/PLATELET
BASOS ABS: 0 10*3/uL (ref 0.0–0.1)
Basophils Relative: 0 %
EOS ABS: 0.1 10*3/uL (ref 0.0–0.7)
EOS PCT: 2 %
HCT: 29.9 % — ABNORMAL LOW (ref 36.0–46.0)
Hemoglobin: 8.9 g/dL — ABNORMAL LOW (ref 12.0–15.0)
Lymphocytes Relative: 19 %
Lymphs Abs: 1.2 10*3/uL (ref 0.7–4.0)
MCH: 28.3 pg (ref 26.0–34.0)
MCHC: 29.8 g/dL — ABNORMAL LOW (ref 30.0–36.0)
MCV: 94.9 fL (ref 78.0–100.0)
Monocytes Absolute: 0.3 10*3/uL (ref 0.1–1.0)
Monocytes Relative: 5 %
Neutro Abs: 4.9 10*3/uL (ref 1.7–7.7)
Neutrophils Relative %: 74 %
PLATELETS: 159 10*3/uL (ref 150–400)
RBC: 3.15 MIL/uL — AB (ref 3.87–5.11)
RDW: 16.6 % — ABNORMAL HIGH (ref 11.5–15.5)
WBC: 6.5 10*3/uL (ref 4.0–10.5)

## 2015-03-30 LAB — COMPREHENSIVE METABOLIC PANEL
ALBUMIN: 2.8 g/dL — AB (ref 3.5–5.0)
ALT: 10 U/L — AB (ref 14–54)
AST: 15 U/L (ref 15–41)
Alkaline Phosphatase: 145 U/L — ABNORMAL HIGH (ref 38–126)
Anion gap: 11 (ref 5–15)
BUN: 60 mg/dL — AB (ref 6–20)
CHLORIDE: 97 mmol/L — AB (ref 101–111)
CO2: 26 mmol/L (ref 22–32)
CREATININE: 6.86 mg/dL — AB (ref 0.44–1.00)
Calcium: 8.5 mg/dL — ABNORMAL LOW (ref 8.9–10.3)
GFR calc Af Amer: 8 mL/min — ABNORMAL LOW (ref >60)
GFR calc non Af Amer: 7 mL/min — ABNORMAL LOW (ref >60)
GLUCOSE: 307 mg/dL — AB (ref 65–99)
Potassium: 3.8 mmol/L (ref 3.5–5.1)
SODIUM: 134 mmol/L — AB (ref 135–145)
Total Bilirubin: 0.7 mg/dL (ref 0.3–1.2)
Total Protein: 7.7 g/dL (ref 6.5–8.1)

## 2015-03-30 MED ORDER — FENTANYL CITRATE (PF) 100 MCG/2ML IJ SOLN
100.0000 ug | Freq: Once | INTRAMUSCULAR | Status: AC
  Administered 2015-03-31: 100 ug via INTRAVENOUS
  Filled 2015-03-30: qty 2

## 2015-03-30 NOTE — ED Notes (Addendum)
The patient said her neck started swelling on the left side about two weeks ago.  The paitent said that two days ago her right side of her neck started swelling as well.  She also says it is painful.  She rates her pain 10/10.  She is a dia;ysis patient that has treatments on T, Th and S.  She did have her treatment yesterday.

## 2015-03-30 NOTE — ED Notes (Signed)
Pt  Has  Pain  And  Swelling of  Her  Neck         For   About  1  Week  It  Is  painfull  And  Swollen           she  Is  A  Dialysis  Pt  dialysed yesterday       She     denys  Any  Injury      Family is  At the  Bedside

## 2015-03-30 NOTE — ED Provider Notes (Signed)
CSN: [id number]     Arrival date & time 03/30/15  1718 History   First MD Initiated Contact with Patient 03/30/15 1819     Chief Complaint  Patient presents with  . Neck Pain   (Consider location/radiation/quality/duration/timing/severity/associated sxs/prior Treatment) Patient is a 44 y.o. female presenting with neck pain. The history is provided by the patient.  Neck Pain Pain location:  R side Quality:  Stabbing Pain radiates to:  Does not radiate Pain severity:  Moderate Onset quality:  Sudden (onset in left neck approx 2 wks ago , resolved, then 2d ago began swelling in right neck with some relapse of left neck.) Progression:  Worsening Chronicity:  New Relieved by:  None tried Worsened by:  Nothing tried Ineffective treatments:  None tried Associated symptoms: no fever   Risk factors comment:  Is a dialysis pt, h/o dvt.   Past Medical History  Diagnosis Date  . Hyperlipidemia   . Alopecia   . Obesity   . Iron deficiency   . RLS (restless legs syndrome)   . Injury of conjunctiva and corneal abrasion of right eye without foreign body   . Cellulitis   . Anemia   . Blood transfusion 2004    "when I had my transplant"  . Migraine   . Hyperparathyroidism, secondary ([place])   . Diabetic retinopathy   . Hypertension     takes Metoprolol and Exforge daily  . Depression     takes Klonopin nightly  . Diabetes mellitus type 1 (HCC)     Pt states diagnosed at age 59 with prior episodes of DKA. S/p pancreatic transplant   . GERD (gastroesophageal reflux disease)     takes Protonix daily  . Bronchitis   . Migraine   . Asthma   . Emphysema of lung ([place])   . Angina     none in past 2 years.  . Stomach pain     Hx: of chronic  . Pneumonia 2012    "double"  . Dialysis patient ([place])     tues,thurs,sat  . 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  Surgery was done at [facility].     . DKA (diabetic ketoacidoses) ([place]) 12/01/2014  . Diastolic CHF, acute on chronic ([place]) 01/02/2015  . Stroke [facility]) 03/2014    ? TIA  . ESRD (end stage renal disease) [facility])     S/p pancreatic and kidney transplant, but back on HD 2008, dialysis at [place], Sat   Past Surgical History  Procedure Laterality Date  . 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 Thyroglobulin      x 7  . Av fistula placement      right upper arm  . Cholecystectomy  1995  .  hd graft placement/removal      "had 2 in my left upper arm"  . Eye surgery    . Retinopathy surgery    . Tooth extraction  10/10/11    X's two  . Insertion of dialysis catheter  12/08/2011    Procedure: INSERTION OF DIALYSIS CATHETER;  Surgeon: Angelia Mould, MD;  Location: [place];  Service: Vascular;  Laterality: Left;  . Av fistula placement  01/22/2012    Procedure: INSERTION OF ARTERIOVENOUS (  AV) GORE-TEX GRAFT ARM;  Surgeon: Angelia Mould, MD;  Location: [place];  Service: Vascular;  Laterality: Left;  Ultrasound guided.  . Av fistula placement  03/14/2012    Procedure: INSERTION OF ARTERIOVENOUS (AV) GORE-TEX GRAFT THIGH;  Surgeon: Angelia Mould, MD;  Location: [place];  Service: Vascular;  Laterality: Right;  . False aneurysm repair Right 01/02/2013    Procedure: Excision of lymphocele in right thigh.;  Surgeon: Angelia Mould, MD;  Location: [place];  Service: Vascular;  Laterality: Right;  . Carpal tunnel release Right 03/02/2013    Procedure: CARPAL TUNNEL RELEASE;  Surgeon: Tennis Must, MD;  Location: [place];  Service: Orthopedics;  Laterality: Right;  . Flexible sigmoidoscopy N/A 03/24/2013    Procedure: FLEXIBLE SIGMOIDOSCOPY;  Surgeon: Cleotis Nipper, MD;  Location: [facility] ENDOSCOPY;  Service: Endoscopy;  Laterality: N/A;   . Revision of arteriovenous goretex graft Right 04/02/2013    Procedure: REPAIR OF PSEUDOANEURYSM OF ARTERIOVENOUS GORETEX GRAFT;  Surgeon: Angelia Mould, MD;  Location: [place];  Service: Vascular;  Laterality: Right;  . Carpal tunnel release Left 10/16/2013    Procedure: LEFT CARPAL TUNNEL RELEASE;  Surgeon: Tennis Must, MD;  Location: [place];  Service: Orthopedics;  Laterality: Left;  . Unilateral upper extremeity angiogram N/A 11/12/2011    Procedure: UNILATERAL UPPER Anselmo Rod;  Surgeon: Angelia Mould, MD;  Location: [facility] CATH LAB;  Service: Cardiovascular;  Laterality: N/A;  . Fistulogram Left 03/03/2012    Procedure: FISTULOGRAM;  Surgeon: Angelia Mould, MD;  Location: [facility] CATH LAB;  Service: Cardiovascular;  Laterality: Left;  . Av fistula placement Left 01/14/2015    Procedure: INSERTION OF LEFT ARTERIOVENOUS (AV) GORE-TEX GRAFT THIGH;  Surgeon: Angelia Mould, MD;  Location: [facility] OR;  Service: Vascular;  Laterality: Left;   Family History  Problem Relation Age of Onset  . Hypertension Mother   . Kidney disease Mother   . Diabetes Mother   . Hypertension Father   . Kidney disease Father   . Diabetes Father    Social History  Substance Use Topics  . Smoking status: Never Smoker   . Smokeless tobacco: Never Used  . Alcohol Use: No   OB History    Gravida Para Term Preterm AB TAB SAB Ectopic Multiple Living   1              Review of Systems  Constitutional: Negative.  Negative for fever.  HENT: Negative for trouble swallowing.   Musculoskeletal: Positive for neck pain.  All other systems reviewed and are negative.   Allergies  Depakote; Morphine and related; Penicillins; Tramadol; Imitrex; and Vicodin  Home Medications   Prior to Admission medications   Medication Sig Start Date End Date Taking? Authorizing Provider  albuterol (PROVENTIL HFA;VENTOLIN HFA) 108 (90 BASE) MCG/ACT inhaler Inhale 2 puffs into the  lungs every 4 (four) hours as needed for wheezing or shortness of breath (as per home regimen). 02/20/13   Kinnie Feil, MD  amLODipine (NORVASC) 10 MG tablet Take 10 mg by mouth at bedtime.  11/17/14   Historical Provider, MD  aspirin 325 MG tablet Take 1 tablet (325 mg total) by mouth daily. 05/20/14   Velvet Bathe, MD  calcium acetate (PHOSLO) 667 MG capsule Take 1 capsule (667 mg total) by mouth 2 (two) times daily. Patient taking differently: Take 2,001 mg by mouth 3 (three) times daily with meals.  11/16/14   Theodis Blaze,  MD  clonazePAM (KLONOPIN) 0.5 MG tablet Take 1 mg by mouth at bedtime.     Historical Provider, MD  Darbepoetin Alfa (ARANESP) 40 MCG/0.4ML SOSY injection Inject 0.4 mLs (40 mcg total) into the vein every Thursday with hemodialysis. 08/05/14   Geradine Girt, DO  doxercalciferol (HECTOROL) 4 MCG/2ML injection Inject 1 mL (2 mcg total) into the vein Every Tuesday,Thursday,and Saturday with dialysis. 08/05/14   Geradine Girt, DO  gabapentin (NEURONTIN) 100 MG capsule Take 100 mg by mouth daily.     Historical Provider, MD  guaiFENesin (MUCINEX) 600 MG 12 hr tablet Take 1 tablet (600 mg total) by mouth 2 (two) times daily. 01/07/15   Barton Dubois, MD  insulin glargine (LANTUS) 100 UNIT/ML injection Inject 0.07 mLs (7 Units total) into the skin 2 (two) times daily. 08/05/14   Geradine Girt, DO  insulin lispro (HUMALOG KWIKPEN) 100 UNIT/ML KiwkPen Inject 5-6 Units into the skin 2 (two) times daily. Inject 6 units subcutaneously in the morning and 5 units in the evening    Historical Provider, MD  lidocaine (LIDODERM) 5 % Place 1 patch onto the skin daily. Remove & Discard patch within 12 hours or as directed by MD 12/06/14   Kelvin Cellar, MD  metoCLOPramide (REGLAN) 10 MG tablet Take 1 tablet (10 mg total) by mouth 4 (four) times daily -  before meals and at bedtime. 12/06/14   Kelvin Cellar, MD  metoprolol succinate (TOPROL-XL) 50 MG 24 hr tablet Take 1 tablet (50 mg total) by  mouth daily. Take with or immediately following a meal. 01/07/15   Barton Dubois, MD  montelukast (SINGULAIR) 10 MG tablet Take 10 mg by mouth at bedtime.    Historical Provider, MD  multivitamin (RENA-VIT) TABS tablet Take 1 tablet by mouth daily. 08/05/14   Geradine Girt, DO  Nutritional Supplements (FEEDING SUPPLEMENT, NEPRO CARB STEADY,) LIQD Take 237 mLs by mouth 2 (two) times daily between meals. 04/23/14   Orson Eva, MD  oxyCODONE-acetaminophen (PERCOCET/ROXICET) 5-325 MG per tablet Take 1-2 tablets by mouth at bedtime as needed. 02/09/15   Angelia Mould, MD  oxyCODONE-acetaminophen (PERCOCET/ROXICET) 5-325 MG tablet Take 1-2 tablets by mouth every 6 (six) hours as needed for severe pain. 03/02/15   Angelia Mould, MD  pantoprazole (PROTONIX) 40 MG tablet Take 1 tablet (40 mg total) by mouth at bedtime. Patient taking differently: Take 40 mg by mouth daily. Takes daily in the afternoon 08/05/14   Geradine Girt, DO  promethazine (PHENERGAN) 25 MG tablet Take 1 tablet (25 mg total) by mouth every 6 (six) hours as needed for nausea or vomiting. 12/06/14   Kelvin Cellar, MD  simvastatin (ZOCOR) 20 MG tablet Take 1 tablet (20 mg total) by mouth at bedtime. 05/20/14   Velvet Bathe, MD  warfarin (COUMADIN) 5 MG tablet Take 0.5 tablets (2.5 mg total) by mouth daily at 6 PM. 01/23/15   Ulyses Amor, PA-C  zolpidem (AMBIEN) 5 MG tablet Take 5 mg by mouth at bedtime as needed. sleep 02/09/14   Historical Provider, MD   Meds Ordered and Administered this Visit  Medications - No data to display  BP 144/53 mmHg  Pulse 92  Temp(Src) 99.3 F (37.4 C) (Oral)  Resp 16  SpO2 97% No data found.   Physical Exam  Constitutional: She appears well-developed and well-nourished. She appears distressed.  Neck: Phonation normal. Normal carotid pulses, no hepatojugular reflux and no JVD present. Carotid bruit is not present. Decreased range  of motion present. No thyroid mass and no thyromegaly  present.  Pulmonary/Chest: No stridor.  Lymphadenopathy:    She has no cervical adenopathy.  Nursing note and vitals reviewed.   ED Course  Procedures (including critical care time)  Labs Review Labs Reviewed - No data to display  Imaging Review No results found.   Visual Acuity Review  Right Eye Distance:   Left Eye Distance:   Bilateral Distance:    Right Eye Near:   Left Eye Near:    Bilateral Near:         MDM   1. Anterior neck pain    Sent for neck eval, tender sts, r/o dissection.    Billy Fischer, MD 03/30/15 [phone]

## 2015-03-31 ENCOUNTER — Emergency Department (HOSPITAL_COMMUNITY): Payer: Medicare Other

## 2015-03-31 ENCOUNTER — Encounter (HOSPITAL_COMMUNITY): Payer: Self-pay | Admitting: Radiology

## 2015-03-31 DIAGNOSIS — Z7982 Long term (current) use of aspirin: Secondary | ICD-10-CM | POA: Diagnosis not present

## 2015-03-31 DIAGNOSIS — R591 Generalized enlarged lymph nodes: Secondary | ICD-10-CM | POA: Diagnosis present

## 2015-03-31 DIAGNOSIS — N186 End stage renal disease: Secondary | ICD-10-CM | POA: Diagnosis present

## 2015-03-31 DIAGNOSIS — R7881 Bacteremia: Secondary | ICD-10-CM | POA: Diagnosis present

## 2015-03-31 DIAGNOSIS — G2581 Restless legs syndrome: Secondary | ICD-10-CM | POA: Diagnosis present

## 2015-03-31 DIAGNOSIS — Z7901 Long term (current) use of anticoagulants: Secondary | ICD-10-CM | POA: Diagnosis not present

## 2015-03-31 DIAGNOSIS — R590 Localized enlarged lymph nodes: Secondary | ICD-10-CM | POA: Diagnosis present

## 2015-03-31 DIAGNOSIS — Z6828 Body mass index (BMI) 28.0-28.9, adult: Secondary | ICD-10-CM | POA: Diagnosis not present

## 2015-03-31 DIAGNOSIS — T8612 Kidney transplant failure: Secondary | ICD-10-CM | POA: Diagnosis present

## 2015-03-31 DIAGNOSIS — I132 Hypertensive heart and chronic kidney disease with heart failure and with stage 5 chronic kidney disease, or end stage renal disease: Secondary | ICD-10-CM | POA: Diagnosis present

## 2015-03-31 DIAGNOSIS — Z9889 Other specified postprocedural states: Secondary | ICD-10-CM | POA: Diagnosis not present

## 2015-03-31 DIAGNOSIS — F329 Major depressive disorder, single episode, unspecified: Secondary | ICD-10-CM | POA: Diagnosis present

## 2015-03-31 DIAGNOSIS — Z940 Kidney transplant status: Secondary | ICD-10-CM | POA: Diagnosis not present

## 2015-03-31 DIAGNOSIS — G4700 Insomnia, unspecified: Secondary | ICD-10-CM | POA: Diagnosis not present

## 2015-03-31 DIAGNOSIS — J159 Unspecified bacterial pneumonia: Secondary | ICD-10-CM | POA: Diagnosis not present

## 2015-03-31 DIAGNOSIS — J9811 Atelectasis: Secondary | ICD-10-CM | POA: Diagnosis present

## 2015-03-31 DIAGNOSIS — B957 Other staphylococcus as the cause of diseases classified elsewhere: Secondary | ICD-10-CM | POA: Diagnosis present

## 2015-03-31 DIAGNOSIS — Z794 Long term (current) use of insulin: Secondary | ICD-10-CM | POA: Diagnosis not present

## 2015-03-31 DIAGNOSIS — Z9483 Pancreas transplant status: Secondary | ICD-10-CM | POA: Diagnosis not present

## 2015-03-31 DIAGNOSIS — I340 Nonrheumatic mitral (valve) insufficiency: Secondary | ICD-10-CM | POA: Diagnosis not present

## 2015-03-31 DIAGNOSIS — I5032 Chronic diastolic (congestive) heart failure: Secondary | ICD-10-CM | POA: Diagnosis present

## 2015-03-31 DIAGNOSIS — Z992 Dependence on renal dialysis: Secondary | ICD-10-CM | POA: Diagnosis not present

## 2015-03-31 DIAGNOSIS — R911 Solitary pulmonary nodule: Secondary | ICD-10-CM | POA: Diagnosis present

## 2015-03-31 DIAGNOSIS — I959 Hypotension, unspecified: Secondary | ICD-10-CM | POA: Diagnosis not present

## 2015-03-31 DIAGNOSIS — Z86718 Personal history of other venous thrombosis and embolism: Secondary | ICD-10-CM | POA: Diagnosis not present

## 2015-03-31 DIAGNOSIS — K219 Gastro-esophageal reflux disease without esophagitis: Secondary | ICD-10-CM | POA: Diagnosis present

## 2015-03-31 DIAGNOSIS — K224 Dyskinesia of esophagus: Secondary | ICD-10-CM | POA: Diagnosis present

## 2015-03-31 DIAGNOSIS — E1065 Type 1 diabetes mellitus with hyperglycemia: Secondary | ICD-10-CM | POA: Diagnosis present

## 2015-03-31 DIAGNOSIS — E46 Unspecified protein-calorie malnutrition: Secondary | ICD-10-CM | POA: Diagnosis present

## 2015-03-31 DIAGNOSIS — R509 Fever, unspecified: Secondary | ICD-10-CM | POA: Diagnosis not present

## 2015-03-31 DIAGNOSIS — E875 Hyperkalemia: Secondary | ICD-10-CM | POA: Diagnosis present

## 2015-03-31 DIAGNOSIS — E785 Hyperlipidemia, unspecified: Secondary | ICD-10-CM | POA: Diagnosis present

## 2015-03-31 DIAGNOSIS — C8591 Non-Hodgkin lymphoma, unspecified, lymph nodes of head, face, and neck: Secondary | ICD-10-CM | POA: Diagnosis present

## 2015-03-31 DIAGNOSIS — D638 Anemia in other chronic diseases classified elsewhere: Secondary | ICD-10-CM | POA: Diagnosis present

## 2015-03-31 DIAGNOSIS — Y830 Surgical operation with transplant of whole organ as the cause of abnormal reaction of the patient, or of later complication, without mention of misadventure at the time of the procedure: Secondary | ICD-10-CM | POA: Diagnosis present

## 2015-03-31 DIAGNOSIS — E10649 Type 1 diabetes mellitus with hypoglycemia without coma: Secondary | ICD-10-CM | POA: Diagnosis present

## 2015-03-31 DIAGNOSIS — E10319 Type 1 diabetes mellitus with unspecified diabetic retinopathy without macular edema: Secondary | ICD-10-CM | POA: Diagnosis present

## 2015-03-31 DIAGNOSIS — N2581 Secondary hyperparathyroidism of renal origin: Secondary | ICD-10-CM | POA: Diagnosis present

## 2015-03-31 DIAGNOSIS — E1022 Type 1 diabetes mellitus with diabetic chronic kidney disease: Secondary | ICD-10-CM | POA: Diagnosis present

## 2015-03-31 LAB — COMPREHENSIVE METABOLIC PANEL
ALK PHOS: 174 U/L — AB (ref 38–126)
ALT: 15 U/L (ref 14–54)
AST: 32 U/L (ref 15–41)
Albumin: 2.8 g/dL — ABNORMAL LOW (ref 3.5–5.0)
Anion gap: 12 (ref 5–15)
BILIRUBIN TOTAL: 0.8 mg/dL (ref 0.3–1.2)
BUN: 77 mg/dL — ABNORMAL HIGH (ref 6–20)
CALCIUM: 8 mg/dL — AB (ref 8.9–10.3)
CO2: 23 mmol/L (ref 22–32)
CREATININE: 8.68 mg/dL — AB (ref 0.44–1.00)
Chloride: 84 mmol/L — ABNORMAL LOW (ref 101–111)
GFR, EST AFRICAN AMERICAN: 6 mL/min — AB (ref >60)
GFR, EST NON AFRICAN AMERICAN: 5 mL/min — AB (ref >60)
Glucose, Bld: 1028 mg/dL (ref 65–99)
Potassium: 6.1 mmol/L (ref 3.5–5.1)
SODIUM: 119 mmol/L — AB (ref 135–145)
Total Protein: 7.7 g/dL (ref 6.5–8.1)

## 2015-03-31 LAB — LACTATE DEHYDROGENASE: LDH: 180 U/L (ref 98–192)

## 2015-03-31 LAB — GLUCOSE, CAPILLARY
GLUCOSE-CAPILLARY: 322 mg/dL — AB (ref 65–99)
Glucose-Capillary: 134 mg/dL — ABNORMAL HIGH (ref 65–99)
Glucose-Capillary: 192 mg/dL — ABNORMAL HIGH (ref 65–99)
Glucose-Capillary: 500 mg/dL — ABNORMAL HIGH (ref 65–99)
Glucose-Capillary: >600 mg/dL (ref 65–99)
Glucose-Capillary: >600 mg/dL (ref 65–99)

## 2015-03-31 LAB — URIC ACID: Uric Acid, Serum: 5.4 mg/dL (ref 2.3–6.6)

## 2015-03-31 LAB — CBG MONITORING, ED
Glucose-Capillary: >600 mg/dL (ref 65–99)
Glucose-Capillary: >600 mg/dL (ref 65–99)

## 2015-03-31 LAB — PROTIME-INR
INR: 1.64 — ABNORMAL HIGH (ref 0.00–1.49)
Prothrombin Time: 19.5 seconds — ABNORMAL HIGH (ref 11.6–15.2)

## 2015-03-31 MED ORDER — ALBUTEROL SULFATE HFA 108 (90 BASE) MCG/ACT IN AERS: 2.0000 | INHALATION_SPRAY | RESPIRATORY_TRACT | Status: DC | PRN

## 2015-03-31 MED ORDER — INSULIN GLARGINE 100 UNIT/ML ~~LOC~~ SOLN
7.0000 [IU] | Freq: Two times a day (BID) | SUBCUTANEOUS | Status: DC
  Filled 2015-03-31: qty 0.07

## 2015-03-31 MED ORDER — ACETAMINOPHEN 325 MG PO TABS: 650.0000 mg | ORAL_TABLET | Freq: Four times a day (QID) | ORAL | Status: DC | PRN

## 2015-03-31 MED ORDER — FENTANYL CITRATE (PF) 100 MCG/2ML IJ SOLN
100.0000 ug | Freq: Once | INTRAMUSCULAR | Status: AC
  Administered 2015-03-31: 100 ug via INTRAVENOUS
  Filled 2015-03-31: qty 2

## 2015-03-31 MED ORDER — ACETAMINOPHEN 325 MG PO TABS
650.0000 mg | ORAL_TABLET | Freq: Four times a day (QID) | ORAL | Status: DC | PRN
  Administered 2015-04-01 – 2015-04-12 (×8): 650 mg via ORAL
  Filled 2015-03-31 (×9): qty 2

## 2015-03-31 MED ORDER — INSULIN GLARGINE 100 UNIT/ML ~~LOC~~ SOLN
7.0000 [IU] | Freq: Two times a day (BID) | SUBCUTANEOUS | Status: DC
  Administered 2015-03-31 – 2015-04-03 (×5): 7 [IU] via SUBCUTANEOUS
  Filled 2015-03-31 (×7): qty 0.07

## 2015-03-31 MED ORDER — DOXERCALCIFEROL 4 MCG/2ML IV SOLN
1.0000 ug | INTRAVENOUS | Status: DC
  Administered 2015-04-02 – 2015-04-12 (×5): 1 ug via INTRAVENOUS
  Filled 2015-03-31 (×5): qty 2

## 2015-03-31 MED ORDER — ACETAMINOPHEN 650 MG RE SUPP
650.0000 mg | Freq: Four times a day (QID) | RECTAL | Status: DC | PRN
  Filled 2015-03-31: qty 1

## 2015-03-31 MED ORDER — ZOLPIDEM TARTRATE 5 MG PO TABS
5.0000 mg | ORAL_TABLET | Freq: Every evening | ORAL | Status: DC | PRN
  Administered 2015-04-08 – 2015-04-10 (×3): 5 mg via ORAL
  Filled 2015-03-31 (×3): qty 1

## 2015-03-31 MED ORDER — CALCIUM ACETATE (PHOS BINDER) 667 MG PO CAPS
667.0000 mg | ORAL_CAPSULE | Freq: Two times a day (BID) | ORAL | Status: DC
  Administered 2015-04-02 – 2015-04-11 (×15): 667 mg via ORAL
  Filled 2015-03-31 (×16): qty 1

## 2015-03-31 MED ORDER — IOHEXOL 300 MG/ML  SOLN
75.0000 mL | Freq: Once | INTRAMUSCULAR | Status: AC | PRN
  Administered 2015-03-31: 75 mL via INTRAVENOUS

## 2015-03-31 MED ORDER — OXYCODONE HCL 5 MG PO TABS
5.0000 mg | ORAL_TABLET | Freq: Four times a day (QID) | ORAL | Status: DC | PRN
  Administered 2015-03-31: 5 mg via ORAL
  Filled 2015-03-31: qty 1

## 2015-03-31 MED ORDER — AMLODIPINE BESYLATE 10 MG PO TABS
10.0000 mg | ORAL_TABLET | Freq: Every day | ORAL | Status: DC
Start: 2015-03-31 — End: 2015-04-02
  Filled 2015-03-31 (×2): qty 1

## 2015-03-31 MED ORDER — ALTEPLASE 2 MG IJ SOLR: 2.0000 mg | Freq: Once | INTRAMUSCULAR | Status: DC | PRN

## 2015-03-31 MED ORDER — RENA-VITE PO TABS
1.0000 | ORAL_TABLET | Freq: Every day | ORAL | Status: DC
  Administered 2015-04-01 – 2015-04-12 (×12): 1 via ORAL
  Filled 2015-03-31 (×13): qty 1

## 2015-03-31 MED ORDER — PROMETHAZINE HCL 25 MG PO TABS: 25.0000 mg | ORAL_TABLET | Freq: Four times a day (QID) | ORAL | Status: DC | PRN

## 2015-03-31 MED ORDER — INSULIN ASPART 100 UNIT/ML ~~LOC~~ SOLN
0.0000 [IU] | Freq: Three times a day (TID) | SUBCUTANEOUS | Status: DC
  Administered 2015-04-01: 7 [IU] via SUBCUTANEOUS
  Administered 2015-04-02: 2 [IU] via SUBCUTANEOUS
  Administered 2015-04-03 (×2): 1 [IU] via SUBCUTANEOUS
  Administered 2015-04-05: 2 [IU] via SUBCUTANEOUS
  Administered 2015-04-06 (×2): 9 [IU] via SUBCUTANEOUS
  Administered 2015-04-07: 2 [IU] via SUBCUTANEOUS

## 2015-03-31 MED ORDER — SIMVASTATIN 20 MG PO TABS
20.0000 mg | ORAL_TABLET | Freq: Every day | ORAL | Status: DC
  Administered 2015-04-01 – 2015-04-11 (×11): 20 mg via ORAL
  Filled 2015-03-31 (×12): qty 1

## 2015-03-31 MED ORDER — LIDOCAINE-PRILOCAINE 2.5-2.5 % EX CREA: 1.0000 "application " | TOPICAL_CREAM | CUTANEOUS | Status: DC | PRN

## 2015-03-31 MED ORDER — CLONAZEPAM 1 MG PO TABS
1.0000 mg | ORAL_TABLET | Freq: Every day | ORAL | Status: DC
  Administered 2015-04-01 – 2015-04-11 (×12): 1 mg via ORAL
  Filled 2015-03-31 (×12): qty 1

## 2015-03-31 MED ORDER — PENTAFLUOROPROP-TETRAFLUOROETH EX AERO: 1.0000 "application " | INHALATION_SPRAY | CUTANEOUS | Status: DC | PRN

## 2015-03-31 MED ORDER — DARBEPOETIN ALFA 200 MCG/0.4ML IJ SOSY
200.0000 ug | PREFILLED_SYRINGE | INTRAMUSCULAR | Status: DC
  Administered 2015-04-07: 200 ug via INTRAVENOUS
  Filled 2015-03-31: qty 0.4

## 2015-03-31 MED ORDER — INSULIN ASPART 100 UNIT/ML ~~LOC~~ SOLN
10.0000 [IU] | Freq: Once | SUBCUTANEOUS | Status: AC
  Administered 2015-03-31: 10 [IU] via SUBCUTANEOUS

## 2015-03-31 MED ORDER — LIDOCAINE HCL (PF) 1 % IJ SOLN: 5.0000 mL | INTRAMUSCULAR | Status: DC | PRN

## 2015-03-31 MED ORDER — PANTOPRAZOLE SODIUM 40 MG PO TBEC
40.0000 mg | DELAYED_RELEASE_TABLET | Freq: Every day | ORAL | Status: DC
  Administered 2015-04-01 – 2015-04-11 (×12): 40 mg via ORAL
  Filled 2015-03-31 (×12): qty 1

## 2015-03-31 MED ORDER — FENTANYL CITRATE (PF) 100 MCG/2ML IJ SOLN
50.0000 ug | Freq: Once | INTRAMUSCULAR | Status: AC
  Administered 2015-03-31: 50 ug via INTRAVENOUS
  Filled 2015-03-31: qty 2

## 2015-03-31 MED ORDER — METOCLOPRAMIDE HCL 10 MG PO TABS
10.0000 mg | ORAL_TABLET | Freq: Three times a day (TID) | ORAL | Status: DC
  Administered 2015-04-01 – 2015-04-12 (×34): 10 mg via ORAL
  Filled 2015-03-31 (×34): qty 1

## 2015-03-31 MED ORDER — ACETAMINOPHEN 650 MG RE SUPP: 650.0000 mg | Freq: Four times a day (QID) | RECTAL | Status: DC | PRN

## 2015-03-31 MED ORDER — ALBUTEROL SULFATE (2.5 MG/3ML) 0.083% IN NEBU: 2.5000 mg | INHALATION_SOLUTION | RESPIRATORY_TRACT | Status: DC | PRN

## 2015-03-31 MED ORDER — NEPRO/CARBSTEADY PO LIQD
237.0000 mL | Freq: Two times a day (BID) | ORAL | Status: DC
  Administered 2015-04-08 – 2015-04-12 (×2): 237 mL via ORAL

## 2015-03-31 MED ORDER — DEXTROSE 50 % IV SOLN
25.0000 mL | INTRAVENOUS | Status: DC | PRN
  Administered 2015-04-04: 25 mL via INTRAVENOUS
  Filled 2015-03-31: qty 50

## 2015-03-31 MED ORDER — SODIUM CHLORIDE 0.9 % IV SOLN
INTRAVENOUS | Status: DC
  Filled 2015-03-31: qty 2.5

## 2015-03-31 MED ORDER — SODIUM CHLORIDE 0.9 % IV SOLN: 100.0000 mL | INTRAVENOUS | Status: DC | PRN

## 2015-03-31 MED ORDER — GUAIFENESIN ER 600 MG PO TB12
600.0000 mg | ORAL_TABLET | Freq: Two times a day (BID) | ORAL | Status: DC
  Administered 2015-04-01 – 2015-04-12 (×21): 600 mg via ORAL
  Filled 2015-03-31 (×24): qty 1

## 2015-03-31 MED ORDER — FENTANYL CITRATE (PF) 100 MCG/2ML IJ SOLN
100.0000 ug | Freq: Once | INTRAMUSCULAR | Status: AC
Start: 2015-03-31 — End: 2015-03-31
  Administered 2015-03-31: 100 ug via INTRAVENOUS
  Filled 2015-03-31: qty 2

## 2015-03-31 MED ORDER — DEXTROSE-NACL 5-0.45 % IV SOLN: INTRAVENOUS | Status: DC

## 2015-03-31 MED ORDER — GABAPENTIN 100 MG PO CAPS
100.0000 mg | ORAL_CAPSULE | Freq: Every day | ORAL | Status: DC
  Administered 2015-04-01 – 2015-04-12 (×12): 100 mg via ORAL
  Filled 2015-03-31 (×14): qty 1

## 2015-03-31 MED ORDER — OXYCODONE HCL 5 MG PO TABS
5.0000 mg | ORAL_TABLET | Freq: Three times a day (TID) | ORAL | Status: DC | PRN
  Administered 2015-04-01 – 2015-04-03 (×4): 5 mg via ORAL
  Filled 2015-03-31 (×4): qty 1

## 2015-03-31 MED ORDER — MONTELUKAST SODIUM 10 MG PO TABS
10.0000 mg | ORAL_TABLET | Freq: Every day | ORAL | Status: DC
  Administered 2015-04-01 – 2015-04-11 (×12): 10 mg via ORAL
  Filled 2015-03-31 (×12): qty 1

## 2015-03-31 MED ORDER — METOPROLOL SUCCINATE ER 50 MG PO TB24
50.0000 mg | ORAL_TABLET | Freq: Every day | ORAL | Status: DC
  Filled 2015-03-31: qty 1

## 2015-03-31 MED ORDER — LIDOCAINE 5 % EX PTCH
1.0000 | MEDICATED_PATCH | CUTANEOUS | Status: DC
  Administered 2015-04-01 – 2015-04-11 (×11): 1 via TRANSDERMAL
  Filled 2015-03-31 (×11): qty 1

## 2015-03-31 MED ORDER — INSULIN REGULAR BOLUS VIA INFUSION
0.0000 [IU] | Freq: Three times a day (TID) | INTRAVENOUS | Status: DC
  Filled 2015-03-31: qty 10

## 2015-03-31 MED ORDER — SODIUM CHLORIDE 0.9 % IV SOLN
INTRAVENOUS | Status: DC
Start: 2015-03-31 — End: 2015-04-01

## 2015-03-31 MED ORDER — INSULIN ASPART 100 UNIT/ML ~~LOC~~ SOLN
10.0000 [IU] | Freq: Once | SUBCUTANEOUS | Status: AC
  Administered 2015-03-31: 10 [IU] via SUBCUTANEOUS
  Filled 2015-03-31: qty 1

## 2015-03-31 MED ORDER — IOHEXOL 300 MG/ML  SOLN
80.0000 mL | Freq: Once | INTRAMUSCULAR | Status: AC | PRN
  Administered 2015-03-31: 80 mL via INTRAVENOUS

## 2015-03-31 MED ORDER — HYDROMORPHONE HCL 1 MG/ML IJ SOLN
1.0000 mg | Freq: Once | INTRAMUSCULAR | Status: AC
  Administered 2015-03-31: 1 mg via INTRAVENOUS
  Filled 2015-03-31: qty 1

## 2015-03-31 MED ORDER — OXYCODONE HCL 5 MG PO TABS: 5.0000 mg | ORAL_TABLET | Freq: Four times a day (QID) | ORAL | Status: DC | PRN

## 2015-03-31 MED ORDER — SENNOSIDES-DOCUSATE SODIUM 8.6-50 MG PO TABS: 1.0000 | ORAL_TABLET | Freq: Every evening | ORAL | Status: DC | PRN

## 2015-03-31 MED ORDER — SODIUM CHLORIDE 0.9 % IV SOLN
125.0000 mg | INTRAVENOUS | Status: AC
  Administered 2015-04-02 – 2015-04-05 (×2): 125 mg via INTRAVENOUS
  Filled 2015-03-31 (×4): qty 10

## 2015-03-31 MED ORDER — DEXAMETHASONE SODIUM PHOSPHATE 10 MG/ML IJ SOLN
10.0000 mg | Freq: Once | INTRAMUSCULAR | Status: AC
  Administered 2015-03-31: 10 mg via INTRAVENOUS
  Filled 2015-03-31: qty 1

## 2015-03-31 NOTE — ED Notes (Signed)
Patient transported to CT 

## 2015-03-31 NOTE — ED Provider Notes (Signed)
CSN: [id number]     Arrival date & time 03/30/15  1907 History   First MD Initiated Contact with Patient 03/30/15 2159     Chief Complaint  Patient presents with  . Lymphadenopathy    The patient said her neck started swelling on the left side about two weeks ago.  The paitent said that two days ago her right side of her neck started swelling as well.  She also says it is painful.     (Consider location/radiation/quality/duration/timing/severity/associated sxs/prior Treatment) HPI Patient presents to the emergency department with neck swelling that started 2 weeks ago.  The patient states she had swelling on the left side of her neck that went down and now has swelling on the right side of her neck and she noticed that the left side of her neck started to swell again today.  Patient states that nothing seems make her condition better or worse.  She did not take any medication prior to arrival.  Patient denies headache, blurred vision, fever, weakness, dizziness, back pain, chest pain, shortness of breath, incontinence, dysuria, rash, headache, dizziness, near syncope or syncope.  She states that palpation of the area makes the pain worse Past Medical History  Diagnosis Date  . Hyperlipidemia   . Alopecia   . Obesity   . Iron deficiency   . RLS (restless legs syndrome)   . Injury of conjunctiva and corneal abrasion of right eye without foreign body   . Cellulitis   . Anemia   . Blood transfusion 2004    "when I had my transplant"  . Migraine   . Hyperparathyroidism, secondary ([place])   . Diabetic retinopathy   . Hypertension     takes Metoprolol and Exforge daily  . Depression     takes Klonopin nightly  . Diabetes mellitus type 1 (HCC)     Pt states diagnosed at age 89 with prior episodes of DKA. S/p pancreatic transplant   . GERD (gastroesophageal reflux disease)     takes Protonix daily  . Bronchitis   . Migraine   . Asthma   . Emphysema of lung ([place])   . Angina     none in  past 2 years.  . Stomach pain     Hx: of chronic  . Pneumonia 2012    "double"  . Dialysis patient ([place])     tues,thurs,sat  . 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  Surgery was done at [facility].     . DKA (diabetic ketoacidoses) ([place]) 12/01/2014  . Diastolic CHF, acute on chronic ([place]) 01/02/2015  . Stroke [facility]) 03/2014    ? TIA  . ESRD (end stage renal disease) [facility])     S/p pancreatic and kidney transplant, but back on HD 2008, dialysis at [place], Sat   Past Surgical History  Procedure Laterality Date  . 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 Thyroglobulin      x 7  . Av fistula placement      right upper arm  . Cholecystectomy  1995  .  hd graft placement/removal      "had 2 in my left upper arm"  . Eye surgery    . Retinopathy surgery    .  Tooth extraction  10/10/11    X's two  . Insertion of dialysis catheter  12/08/2011    Procedure: INSERTION OF DIALYSIS CATHETER;  Surgeon: Angelia Mould, MD;  Location: [place];  Service: Vascular;  Laterality: Left;  . Av fistula placement  01/22/2012    Procedure: INSERTION OF ARTERIOVENOUS (AV) GORE-TEX GRAFT ARM;  Surgeon: Angelia Mould, MD;  Location: [place];  Service: Vascular;  Laterality: Left;  Ultrasound guided.  . Av fistula placement  03/14/2012    Procedure: INSERTION OF ARTERIOVENOUS (AV) GORE-TEX GRAFT THIGH;  Surgeon: Angelia Mould, MD;  Location: [place];  Service: Vascular;  Laterality: Right;  . False aneurysm repair Right 01/02/2013    Procedure: Excision of lymphocele in right thigh.;  Surgeon: Angelia Mould, MD;  Location: [place];  Service: Vascular;  Laterality: Right;  . Carpal tunnel release Right 03/02/2013    Procedure:  CARPAL TUNNEL RELEASE;  Surgeon: Tennis Must, MD;  Location: [place];  Service: Orthopedics;  Laterality: Right;  . Flexible sigmoidoscopy N/A 03/24/2013    Procedure: FLEXIBLE SIGMOIDOSCOPY;  Surgeon: Cleotis Nipper, MD;  Location: [facility] ENDOSCOPY;  Service: Endoscopy;  Laterality: N/A;  . Revision of arteriovenous goretex graft Right 04/02/2013    Procedure: REPAIR OF PSEUDOANEURYSM OF ARTERIOVENOUS GORETEX GRAFT;  Surgeon: Angelia Mould, MD;  Location: [place];  Service: Vascular;  Laterality: Right;  . Carpal tunnel release Left 10/16/2013    Procedure: LEFT CARPAL TUNNEL RELEASE;  Surgeon: Tennis Must, MD;  Location: [place];  Service: Orthopedics;  Laterality: Left;  . Unilateral upper extremeity angiogram N/A 11/12/2011    Procedure: UNILATERAL UPPER Anselmo Rod;  Surgeon: Angelia Mould, MD;  Location: [facility] CATH LAB;  Service: Cardiovascular;  Laterality: N/A;  . Fistulogram Left 03/03/2012    Procedure: FISTULOGRAM;  Surgeon: Angelia Mould, MD;  Location: [facility] CATH LAB;  Service: Cardiovascular;  Laterality: Left;  . Av fistula placement Left 01/14/2015    Procedure: INSERTION OF LEFT ARTERIOVENOUS (AV) GORE-TEX GRAFT THIGH;  Surgeon: Angelia Mould, MD;  Location: [facility] OR;  Service: Vascular;  Laterality: Left;   Family History  Problem Relation Age of Onset  . Hypertension Mother   . Kidney disease Mother   . Diabetes Mother   . Hypertension Father   . Kidney disease Father   . Diabetes Father    Social History  Substance Use Topics  . Smoking status: Never Smoker   . Smokeless tobacco: Never Used  . Alcohol Use: No   OB History    Gravida Para Term Preterm AB TAB SAB Ectopic Multiple Living   1              Review of Systems All other systems negative except as documented in the HPI. All pertinent positives and negatives as reviewed in the HPI.   Allergies  Depakote; Morphine and related; Penicillins;  Tramadol; Imitrex; and Vicodin  Home Medications   Prior to Admission medications   Medication Sig Start Date End Date Taking? Authorizing Provider  albuterol (PROVENTIL HFA;VENTOLIN HFA) 108 (90 BASE) MCG/ACT inhaler Inhale 2 puffs into the lungs every 4 (four) hours as needed for wheezing or shortness of breath (as per home regimen). 02/20/13  Yes Kinnie Feil, MD  amLODipine (NORVASC) 10 MG tablet Take 10 mg by mouth at bedtime.  11/17/14  Yes Historical Provider, MD  aspirin 325 MG tablet Take 1 tablet (325 mg total) by mouth  daily. 05/20/14  Yes Velvet Bathe, MD  calcium acetate (PHOSLO) 667 MG capsule Take 1 capsule (667 mg total) by mouth 2 (two) times daily. Patient taking differently: Take 2,001 mg by mouth 3 (three) times daily with meals.  11/16/14  Yes Theodis Blaze, MD  clonazePAM (KLONOPIN) 0.5 MG tablet Take 1 mg by mouth at bedtime.    Yes Historical Provider, MD  Darbepoetin Alfa (ARANESP) 40 MCG/0.4ML SOSY injection Inject 0.4 mLs (40 mcg total) into the vein every Thursday with hemodialysis. 08/05/14  Yes Geradine Girt, DO  doxercalciferol (HECTOROL) 4 MCG/2ML injection Inject 1 mL (2 mcg total) into the vein Every Tuesday,Thursday,and Saturday with dialysis. 08/05/14  Yes Geradine Girt, DO  gabapentin (NEURONTIN) 100 MG capsule Take 100 mg by mouth daily.    Yes Historical Provider, MD  guaiFENesin (MUCINEX) 600 MG 12 hr tablet Take 1 tablet (600 mg total) by mouth 2 (two) times daily. 01/07/15  Yes Barton Dubois, MD  insulin glargine (LANTUS) 100 UNIT/ML injection Inject 0.07 mLs (7 Units total) into the skin 2 (two) times daily. 08/05/14  Yes Geradine Girt, DO  insulin lispro (HUMALOG KWIKPEN) 100 UNIT/ML KiwkPen Inject 6 Units into the skin 2 (two) times daily.    Yes Historical Provider, MD  lidocaine (LIDODERM) 5 % Place 1 patch onto the skin daily. Remove & Discard patch within 12 hours or as directed by MD 12/06/14  Yes Kelvin Cellar, MD  metoCLOPramide (REGLAN) 10 MG  tablet Take 1 tablet (10 mg total) by mouth 4 (four) times daily -  before meals and at bedtime. 12/06/14  Yes Kelvin Cellar, MD  metoprolol succinate (TOPROL-XL) 50 MG 24 hr tablet Take 1 tablet (50 mg total) by mouth daily. Take with or immediately following a meal. 01/07/15  Yes Barton Dubois, MD  montelukast (SINGULAIR) 10 MG tablet Take 10 mg by mouth at bedtime.   Yes Historical Provider, MD  multivitamin (RENA-VIT) TABS tablet Take 1 tablet by mouth daily. 08/05/14  Yes Geradine Girt, DO  Nutritional Supplements (FEEDING SUPPLEMENT, NEPRO CARB STEADY,) LIQD Take 237 mLs by mouth 2 (two) times daily between meals. 04/23/14  Yes Orson Eva, MD  Oxycodone HCl 10 MG TABS Take 10 mg by mouth 3 (three) times daily as needed (for pain).  03/23/15  Yes Historical Provider, MD  oxyCODONE-acetaminophen (PERCOCET/ROXICET) 5-325 MG tablet Take 1-2 tablets by mouth every 6 (six) hours as needed for severe pain. 03/02/15  Yes Angelia Mould, MD  pantoprazole (PROTONIX) 40 MG tablet Take 1 tablet (40 mg total) by mouth at bedtime. Patient taking differently: Take 40 mg by mouth daily. Takes daily in the afternoon 08/05/14  Yes Geradine Girt, DO  promethazine (PHENERGAN) 25 MG tablet Take 1 tablet (25 mg total) by mouth every 6 (six) hours as needed for nausea or vomiting. 12/06/14  Yes Kelvin Cellar, MD  simvastatin (ZOCOR) 20 MG tablet Take 1 tablet (20 mg total) by mouth at bedtime. 05/20/14  Yes Velvet Bathe, MD  warfarin (COUMADIN) 5 MG tablet Take 0.5 tablets (2.5 mg total) by mouth daily at 6 PM. Patient taking differently: Take 5-7.5 mg by mouth See admin instructions. Take 1 tablet on Tuesday, Thursday and Saturday then take 1 and 1/2 tablets on Monday, Wednesday, Friday and Sunday 01/23/15  Yes Ulyses Amor, PA-C  zolpidem (AMBIEN) 5 MG tablet Take 5 mg by mouth at bedtime as needed. sleep 02/09/14  Yes Historical Provider, MD  oxyCODONE-acetaminophen (PERCOCET/ROXICET) 5-325  MG per tablet Take  1-2 tablets by mouth at bedtime as needed. Patient not taking: Reported on 03/30/2015 02/09/15   Angelia Mould, MD   BP 120/103 mmHg  Pulse 103  Temp(Src) 98.1 F (36.7 C) (Oral)  Resp 18  Ht 5\' 1"  (1.549 m)  Wt 150 lb (68.04 kg)  BMI 28.36 kg/m2  SpO2 100%  LMP 03/02/2015 Physical Exam  Constitutional: She is oriented to person, place, and time. She appears well-developed and well-nourished. No distress.  HENT:  Head: Normocephalic and atraumatic.  Mouth/Throat: Oropharynx is clear and moist.  Eyes: Pupils are equal, round, and reactive to light.  Neck: Normal range of motion. Neck supple.    Cardiovascular: Normal rate, regular rhythm and normal heart sounds.  Exam reveals no gallop and no friction rub.   No murmur heard. Pulmonary/Chest: Effort normal and breath sounds normal. No respiratory distress.  Neurological: She is alert and oriented to person, place, and time. She exhibits normal muscle tone. Coordination normal.  Skin: Skin is warm and dry. No rash noted. No erythema.  Nursing note and vitals reviewed.   ED Course  Procedures (including critical care time) Labs Review Labs Reviewed  CBC WITH DIFFERENTIAL/PLATELET - Abnormal; Notable for the following:    RBC 3.15 (*)    Hemoglobin 8.9 (*)    HCT 29.9 (*)    MCHC 29.8 (*)    RDW 16.6 (*)    All other components within normal limits  COMPREHENSIVE METABOLIC PANEL - Abnormal; Notable for the following:    Sodium 134 (*)    Chloride 97 (*)    Glucose, Bld 307 (*)    BUN 60 (*)    Creatinine, Ser 6.86 (*)    Calcium 8.5 (*)    Albumin 2.8 (*)    ALT 10 (*)    Alkaline Phosphatase 145 (*)    GFR calc non Af Amer 7 (*)    GFR calc Af Amer 8 (*)    All other components within normal limits      I have personally reviewed and evaluated these images and lab results as part of my medical decision-making.   Patient will need CT scan of the neck to rule out any significant  abnormality.   Dalia Heading, PA-C 03/31/15 0130  Forde Dandy, MD 04/01/15 1201

## 2015-03-31 NOTE — ED Notes (Signed)
Pt left with all her belongings and ambulated out of the treatment area.  

## 2015-03-31 NOTE — Progress Notes (Signed)
Admission note:  Arrival Method: wheelchair  Mental Orientation: alert & oriented x 4  Telemetry: not ordered  Assessment: completed  Skin: dry  IV: left AC Pain: pt reports pain 9/10 offered heat application since pt had pain medication in ED prior to arrival to unit  Tubes: N/A  Admission: Completed and admission orders have been written  6E Orientation: Patient has been oriented to the unit, staff and to the room.     Linetta Regner [employer], RN [employer] Phone [phone]

## 2015-03-31 NOTE — ED Notes (Signed)
MD at bedside, aware of elevated CBG and need for diet ordered.  MD to place orders before sending pt up to floor

## 2015-03-31 NOTE — ED Notes (Signed)
Paged Admitting MD regarding pt CBG.

## 2015-03-31 NOTE — ED Provider Notes (Addendum)
Care assumed from Dr. Claudine Mouton.  Hx pancreas/kidney transplant that has failed with lateral neck and supraclavicular swelling x 1 week. CT chest pending.  CT scan with extensive areas of lymphadenopathy supraclavicularlly. axillary and the chest. There is a concerning mass in the right chest concerning for neoplasm.  Patient states her PCP is essentially her nephrologist. She has a family practice doctor she sees in [place] occasionally. She is due for dialysis today.  Patient would benefit from tissue diagnosis and further workup. Unassigned admission discussed with internal medicine residents.  Patient's stayed in the ED all night and did not get her insulin over night it appears. She is now hyperglycemic greater than 600. Repeat basic metabolic panel sent to rule out DKA. Insulin ordered and discussed with admitting team.  Ezequiel [place], MD 03/31/15 [place], MD 03/31/15 [phone]

## 2015-03-31 NOTE — ED Notes (Signed)
RN asked MD if ok for pt to eat. MD ok with pt eating lunch.

## 2015-03-31 NOTE — Progress Notes (Signed)
IR PA aware of request for image guided biopsy. Dr. Anselm Pancoast has reviewed the images and has recommended CT abdomen/pelvis for further evaluation prior to biopsy.   Tsosie Billing PA-C Interventional Radiology  03/31/15  1:28 PM

## 2015-03-31 NOTE — Progress Notes (Signed)
Responded to page from nurse to provide support to patient that reported to ED with swelling in neck. Patient is currently waiting on result from CT scan.  Patient said that she was afraid but is not overwhelmed. Patient requested bible and prayer both were provided.  Ms. Chernick is expecting mother and boyfriend to visit with her later.  Ms. Henthorn has a 42yo daughter in college in [place] who is not aware of her condition. Chaplain listened to patients concerns and provided emotional and spiritual support. Patient asked for follow up visit once she is assigned to unit.  Will pass on to unit chaplain for continued support.   03/31/15 0900  Clinical Encounter Type  Visited With Patient;Health care provider  Visit Type Initial;Spiritual support;ED  Referral From Nurse  Spiritual Encounters  Spiritual Needs Prayer;Emotional  Stress Factors  Patient Stress Factors Health changes;Other (Comment) (afraid but not ovwerwhelmed)

## 2015-03-31 NOTE — Progress Notes (Signed)
CRITICAL VALUE ALERT  Critical value received:  Sodium 119, potassium 6.1, glucose 1028  Date of notification:  03/31/2015  Time of notification:  7494  Critical value read back:Yes.    Nurse who received alert:  Marijean Heath, RN   MD notified (1st page):  Dr. Genene Churn   Time of first page:  1717  MD notified (2nd page):  Time of second page:  Responding MD:  Dr. Genene Churn   Time MD responded:  [phone]

## 2015-03-31 NOTE — ED Provider Notes (Signed)
Angiocath insertion Performed by: Everlene Balls  Consent: Verbal consent obtained. Risks and benefits: risks, benefits and alternatives were discussed Time out: Immediately prior to procedure a "time out" was called to verify the correct patient, procedure, equipment, support staff and site/side marked as required.  Preparation: Patient was prepped and draped in the usual sterile fashion.  Vein Location: L brachial vein  Ultrasound Guided  Gauge: 20G  Normal blood return and flush without difficulty Patient tolerance: Patient tolerated the procedure well with no immediate complications.     Everlene Balls, MD 03/31/15 [phone]

## 2015-03-31 NOTE — ED Notes (Signed)
Ordered lunch tray for pt  

## 2015-03-31 NOTE — H&P (Signed)
Date: 03/31/2015               Patient Name:  [name] MRN: [id number]  DOB: [birth year] Age / Sex: 44 y.o., female   PCP: [name], MD           Medical Service: Internal Medicine Teaching Service         Attending Physician: Dr. Aldine Contes, MD    First Contact: Dr. Marlowe Sax, MD Pager: [phone]  Second Contact: Dr. Genene Churn, MD Pager: [phone]       After Hours (After 5p/  First Contact Pager: [phone]  weekends / holidays): Second Contact Pager: [phone]    Most Recent Discharge Date:  03/30/15  Chief Complaint:  Chief Complaint  Patient presents with  . Lymphadenopathy    The patient said her neck started swelling on the left side about two weeks ago.  The paitent said that two days ago her right side of her neck started swelling as well.  She also says it is painful.       History of Present Illness:  [name] is a 44 y.o. female who has a past medical history of Hypertension, Diabetes mellitus type 1 with multiple admissions for DKA, h/o DVT after AVG placement ~1 month ago on coumadin (INR subtherapeutic today at 1.64), GERD, asthma, h/o failed simultaneous kidney and pancreas transplant 2004 at [facility] (was on cellcept, prednisone, prograf for ~ 6 yrs), "mini stroke", ESRD on HD TTS who p/w neck swelling that began about 2 weeks ago.  Pt had left sided swelling that began 2 weeks ago and then developed right sided swelling several days ago.  She reports the right side is very painful and tender to palpation.  She denies any night sweats, fevers/chills, pharyngitis, dysphagia, dyspnea, CP, weight loss, fatigue, abdominal pain, rash, or any other swellings.  She does endorse decreased appetite for the past month.  No FH of any malignancies.  She lives alone in [place].  She has no pets and denies any sick contacts.  She is sexually active.  Denies any IVDU, tobacco abuse, or alcohol use.  She has a PCP at [facility] that she saw about 3 weeks ago.  Last HD was on  Tuesday without complication and follows with Dr. Lorrene Reid who also manages her coumadin.  She was diagnosed with DM when she was 44 yrs old.    In the ED, CT scan of neck revealed mild pharyngitis with small retropharyngeal effusion w/o abscess.  Also w/ lymphadenopathy throughout the neck and mediastinal lymphadenopathy.  Also with chronically occluded bilateral IJ veins and enlarged patent external jugular veins.  CT of chest recommended and revealed subpleural LLL pulmonary nodule, mild bilateral axillary lymphadenopathy (largest 52mm).  Also w/mediastinal adenopathy, supraclavicular and infraclavicular adenopathy.  There is a spiculated masslike area in the right pectoralis will ill defined margins.  Appearance most concerning for lymphoma or metastatic disease.    Meds: Current Facility-Administered Medications  Medication Dose Route Frequency Provider Last Rate Last Dose  . acetaminophen (TYLENOL) tablet 650 mg  650 mg Oral Q6H PRN Jones Bales, MD       Or  . acetaminophen (TYLENOL) suppository 650 mg  650 mg Rectal Q6H PRN Jones Bales, MD      . albuterol (PROVENTIL HFA;VENTOLIN HFA) 108 (90 BASE) MCG/ACT inhaler 2 puff  2 puff Inhalation Q4H PRN Jones Bales, MD      . amLODipine (NORVASC) tablet 10 mg  10  mg Oral QHS Jones Bales, MD      . calcium acetate (PHOSLO) capsule 667 mg  667 mg Oral BID Jones Bales, MD      . [facility]) tablet 1 mg  1 mg Oral QHS Jones Bales, MD      . Derrill Memo ON 04/07/2015] Darbepoetin Alfa (ARANESP) injection 200 mcg  200 mcg Intravenous Q Thu-HD Alric Seton, PA-C      . Derrill Memo ON 04/02/2015] doxercalciferol (HECTOROL) injection 1 mcg  1 mcg Intravenous Q T,Th,Sa-HD Alric Seton, PA-C      . feeding supplement (NEPRO CARB STEADY) liquid 237 mL  237 mL Oral BID BM Jones Bales, MD      . Derrill Memo ON 04/02/2015] ferric gluconate (NULECIT) 125 mg in sodium chloride 0.9 % 100 mL IVPB  125 mg Intravenous Q T,Th,Sa-HD Alric Seton, PA-C      . gabapentin (NEURONTIN) capsule 100 mg  100 mg Oral Daily Jones Bales, MD      . guaiFENesin (MUCINEX) 12 hr tablet 600 mg  600 mg Oral BID Jones Bales, MD      . insulin aspart (novoLOG) injection 0-9 Units  0-9 Units Subcutaneous TID WC Jones Bales, MD      . insulin glargine (LANTUS) injection 7 Units  7 Units Subcutaneous BID Jones Bales, MD      . lidocaine (LIDODERM) 5 % 1 patch  1 patch Transdermal Q24H Jones Bales, MD      . metoCLOPramide (REGLAN) tablet 10 mg  10 mg Oral TID AC & HS Jones Bales, MD      . metoprolol succinate (TOPROL-XL) 24 hr tablet 50 mg  50 mg Oral Daily Jones Bales, MD      . montelukast (SINGULAIR) tablet 10 mg  10 mg Oral QHS Jones Bales, MD      . multivitamin (RENA-VIT) tablet 1 tablet  1 tablet Oral Daily Jones Bales, MD      . oxyCODONE (Oxy IR/ROXICODONE) immediate release tablet 5 mg  5 mg Oral Q6H PRN Jones Bales, MD      . pantoprazole (PROTONIX) EC tablet 40 mg  40 mg Oral QHS Jones Bales, MD      . promethazine (PHENERGAN) tablet 25 mg  25 mg Oral Q6H PRN Jones Bales, MD      . senna-docusate (Senokot-S) tablet 1 tablet  1 tablet Oral QHS PRN Jones Bales, MD      . simvastatin (ZOCOR) tablet 20 mg  20 mg Oral QHS Jones Bales, MD      . zolpidem (AMBIEN) tablet 5 mg  5 mg Oral QHS PRN Jones Bales, MD       Current Outpatient Prescriptions  Medication Sig Dispense Refill  . albuterol (PROVENTIL HFA;VENTOLIN HFA) 108 (90 BASE) MCG/ACT inhaler Inhale 2 puffs into the lungs every 4 (four) hours as needed for wheezing or shortness of breath (as per home regimen). 1 Inhaler 0  . amLODipine (NORVASC) 10 MG tablet Take 10 mg by mouth at bedtime.   11  . aspirin 325 MG tablet Take 1 tablet (325 mg total) by mouth daily. 30 tablet 0  . calcium acetate (PHOSLO) 667 MG capsule Take 1 capsule (667 mg total) by mouth 2 (two) times daily. (Patient taking differently: Take  2,001 mg by mouth 3 (three) times daily with meals. ) 30 capsule 0  . clonazePAM (KLONOPIN) 0.5  MG tablet Take 1 mg by mouth at bedtime.     . Darbepoetin Alfa (ARANESP) 40 MCG/0.4ML SOSY injection Inject 0.4 mLs (40 mcg total) into the vein every Thursday with hemodialysis. 8.4 mL   . doxercalciferol (HECTOROL) 4 MCG/2ML injection Inject 1 mL (2 mcg total) into the vein Every Tuesday,Thursday,and Saturday with dialysis. 2 mL   . gabapentin (NEURONTIN) 100 MG capsule Take 100 mg by mouth daily.     Marland Kitchen guaiFENesin (MUCINEX) 600 MG 12 hr tablet Take 1 tablet (600 mg total) by mouth 2 (two) times daily. 30 tablet 0  . insulin glargine (LANTUS) 100 UNIT/ML injection Inject 0.07 mLs (7 Units total) into the skin 2 (two) times daily. 10 mL 11  . insulin lispro (HUMALOG KWIKPEN) 100 UNIT/ML KiwkPen Inject 6 Units into the skin 2 (two) times daily.     Marland Kitchen lidocaine (LIDODERM) 5 % Place 1 patch onto the skin daily. Remove & Discard patch within 12 hours or as directed by MD 10 patch 0  . metoCLOPramide (REGLAN) 10 MG tablet Take 1 tablet (10 mg total) by mouth 4 (four) times daily -  before meals and at bedtime. 60 tablet 0  . metoprolol succinate (TOPROL-XL) 50 MG 24 hr tablet Take 1 tablet (50 mg total) by mouth daily. Take with or immediately following a meal. 30 tablet 1  . montelukast (SINGULAIR) 10 MG tablet Take 10 mg by mouth at bedtime.    . multivitamin (RENA-VIT) TABS tablet Take 1 tablet by mouth daily. 30 tablet 0  . Nutritional Supplements (FEEDING SUPPLEMENT, NEPRO CARB STEADY,) LIQD Take 237 mLs by mouth 2 (two) times daily between meals. 60 Can 0  . Oxycodone HCl 10 MG TABS Take 10 mg by mouth 3 (three) times daily as needed (for pain).   0  . oxyCODONE-acetaminophen (PERCOCET/ROXICET) 5-325 MG tablet Take 1-2 tablets by mouth every 6 (six) hours as needed for severe pain. 30 tablet 0  . pantoprazole (PROTONIX) 40 MG tablet Take 1 tablet (40 mg total) by mouth at bedtime. (Patient taking  differently: Take 40 mg by mouth daily. Takes daily in the afternoon) 30 tablet 0  . promethazine (PHENERGAN) 25 MG tablet Take 1 tablet (25 mg total) by mouth every 6 (six) hours as needed for nausea or vomiting. 10 tablet 0  . simvastatin (ZOCOR) 20 MG tablet Take 1 tablet (20 mg total) by mouth at bedtime. 30 tablet 0  . warfarin (COUMADIN) 5 MG tablet Take 0.5 tablets (2.5 mg total) by mouth daily at 6 PM. (Patient taking differently: Take 5-7.5 mg by mouth See admin instructions. Take 1 tablet on Tuesday, Thursday and Saturday then take 1 and 1/2 tablets on Monday, Wednesday, Friday and Sunday) 60 tablet 0  . zolpidem (AMBIEN) 5 MG tablet Take 5 mg by mouth at bedtime as needed. sleep       (Not in a hospital admission)  Allergies: Allergies as of 03/30/2015 - Review Complete 03/30/2015  Allergen Reaction Noted  . Depakote [divalproex sodium] Other (See Comments) 09/03/2012  . Morphine and related Nausea And Vomiting and Other (See Comments) 01/18/2011  . Penicillins Anaphylaxis 04/23/2011  . Tramadol Nausea And Vomiting 01/18/2011  . Imitrex [sumatriptan] Other (See Comments) 05/16/2013  . Vicodin [hydrocodone-acetaminophen] Itching and Rash 12/06/2011    PMH: Past Medical History  Diagnosis Date  . Hyperlipidemia   . Alopecia   . Obesity   . Iron deficiency   . RLS (restless legs syndrome)   . Injury  of conjunctiva and corneal abrasion of right eye without foreign body   . Cellulitis   . Anemia   . Blood transfusion 2004    "when I had my transplant"  . Migraine   . Hyperparathyroidism, secondary ([place])   . Diabetic retinopathy   . Hypertension     takes Metoprolol and Exforge daily  . Depression     takes Klonopin nightly  . Diabetes mellitus type 1 (HCC)     Pt states diagnosed at age 28 with prior episodes of DKA. S/p pancreatic transplant   . GERD (gastroesophageal reflux disease)     takes Protonix daily  . Bronchitis   . Migraine   . Asthma   . Emphysema of  lung ([place])   . Angina     none in past 2 years.  . Stomach pain     Hx: of chronic  . Pneumonia 2012    "double"  . Dialysis patient ([place])     tues,thurs,sat  . History of simultaneous kidney and pancreas transplant ([place]) 08/01/2011    Transplant kidney failed and went back on HD in 2008, pancreas then failed in 2014 and she has been transitioned off of her immunosuppression medications in late 2014 and early 2015.  Surgery was done at [facility].     . DKA (diabetic ketoacidoses) ([place]) 12/01/2014  . Diastolic CHF, acute on chronic ([place]) 01/02/2015  . Stroke [facility]) 03/2014    ? TIA  . ESRD (end stage renal disease) [facility])     S/p pancreatic and kidney transplant, but back on HD 2008, dialysis at [facility] [place], Sat    PSH: Past Surgical History  Procedure Laterality Date  . 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 Thyroglobulin      x 7  . Av fistula placement      right upper arm  . Cholecystectomy  1995  .  hd graft placement/removal      "had 2 in my left upper arm"  . Eye surgery    . Retinopathy surgery    . Tooth extraction  10/10/11    X's two  . Insertion of dialysis catheter  12/08/2011    Procedure: INSERTION OF DIALYSIS CATHETER;  Surgeon: Angelia Mould, MD;  Location: [place];  Service: Vascular;  Laterality: Left;  . Av fistula placement  01/22/2012    Procedure: INSERTION OF ARTERIOVENOUS (AV) GORE-TEX GRAFT ARM;  Surgeon: Angelia Mould, MD;  Location: [place];  Service: Vascular;  Laterality: Left;  Ultrasound guided.  . Av fistula placement  03/14/2012    Procedure: INSERTION OF ARTERIOVENOUS (AV) GORE-TEX GRAFT THIGH;  Surgeon: Angelia Mould, MD;  Location: [place];  Service: Vascular;  Laterality: Right;  . False aneurysm repair Right 01/02/2013    Procedure: Excision of lymphocele in right thigh.;  Surgeon: Angelia Mould, MD;  Location: [place];  Service: Vascular;  Laterality: Right;  . Carpal tunnel  release Right 03/02/2013    Procedure: CARPAL TUNNEL RELEASE;  Surgeon: Tennis Must, MD;  Location: [place];  Service: Orthopedics;  Laterality: Right;  . Flexible sigmoidoscopy N/A 03/24/2013    Procedure: FLEXIBLE SIGMOIDOSCOPY;  Surgeon: Cleotis Nipper, MD;  Location: [facility] ENDOSCOPY;  Service: Endoscopy;  Laterality: N/A;  . Revision of arteriovenous goretex graft Right 04/02/2013    Procedure: REPAIR OF PSEUDOANEURYSM OF ARTERIOVENOUS GORETEX GRAFT;  Surgeon: Angelia Mould, MD;  Location: [place];  Service: Vascular;  Laterality: Right;  . Carpal tunnel release Left 10/16/2013    Procedure: LEFT CARPAL TUNNEL RELEASE;  Surgeon: Tennis Must, MD;  Location: [place];  Service: Orthopedics;  Laterality: Left;  . Unilateral upper extremeity angiogram N/A 11/12/2011    Procedure: UNILATERAL UPPER Anselmo Rod;  Surgeon: Angelia Mould, MD;  Location: [facility] CATH LAB;  Service: Cardiovascular;  Laterality: N/A;  . Fistulogram Left 03/03/2012    Procedure: FISTULOGRAM;  Surgeon: Angelia Mould, MD;  Location: [facility] CATH LAB;  Service: Cardiovascular;  Laterality: Left;  . Av fistula placement Left 01/14/2015    Procedure: INSERTION OF LEFT ARTERIOVENOUS (AV) GORE-TEX GRAFT THIGH;  Surgeon: Angelia Mould, MD;  Location: [facility] OR;  Service: Vascular;  Laterality: Left;    FH: Family History  Problem Relation Age of Onset  . Hypertension Mother   . Kidney disease Mother   . Diabetes Mother   . Hypertension Father   . Kidney disease Father   . Diabetes Father     SH: Social History  Substance Use Topics  . Smoking status: Never Smoker   . Smokeless tobacco: Never Used  . Alcohol Use: No    Review of Systems: Pertinent items are noted in HPI.  Physical Exam: BP 137/65 mmHg  Pulse 96  Temp(Src) 98.1 F (36.7 C) (Oral)  Resp 18  Ht 5\' 1"  (1.549 m)  Wt 150 lb (68.04 kg)  BMI 28.36 kg/m2  SpO2 95%  LMP  03/02/2015  Physical Exam  Constitutional: Vital signs reviewed.  Patient is a well-developed and well-nourished female in no acute distress and cooperative with exam.  Head: Normocephalic and atraumatic Eyes:  EOMI, conjunctivae normal. Neck: Supple, Trachea midline.  Enlarged slightly tender to palpation supraclavicular nodes on the R&L with neck and facial fullness.   Cardiovascular: RRR, murmur noted (also previous AVF in b/l UE).  Pulmonary/Chest: normal respiratory effort, CTAB, no wheezes, rales, or rhonchi.  Wearing O2.  Abdominal: Soft. Non-tender, non-distended, bowel sounds are present.  Midline surgical scar.   Neurological: A&O x3, cranial nerve II-XII are grossly intact, moving all extremities.   Skin: Warm, dry and intact. No rash.  Multiple tattoos.  Extremities: LLL AVG with thrill and bruit.  Psychiatric: Normal mood and affect.    Lab results:  Basic Metabolic Panel:  Recent Labs  03/30/15 1944  NA 134*  K 3.8  CL 97*  CO2 26  GLUCOSE 307*  BUN 60*  CREATININE 6.86*  CALCIUM 8.5*    Calcium/Magnesium/Phosphorus:  Recent Labs Lab 03/30/15 1944  CALCIUM 8.5*    Liver Function Tests:  Recent Labs  03/30/15 1944  AST 15  ALT 10*  ALKPHOS 145*  BILITOT 0.7  PROT 7.7  ALBUMIN 2.8*   No results for input(s): LIPASE, AMYLASE in the last 72 hours. No results for input(s): AMMONIA in the last 72 hours.  CBC: Lab Results  Component Value Date   WBC 6.5 03/30/2015   HGB 8.9* 03/30/2015   HCT 29.9* 03/30/2015   MCV 94.9 03/30/2015   PLT 159 03/30/2015    Lipase: Lab Results  Component Value Date   LIPASE 76* 11/27/2013    Lactic Acid/Procalcitonin: No results for input(s): LATICACIDVEN, PROCALCITON, O2SATVEN in the last 168 hours.  Cardiac Enzymes: No results for input(s): TROPIPOC in the last 72 hours. Lab Results  Component Value Date   CKTOTAL 102 07/15/2011   CKMB 1.8 07/15/2011   TROPONINI <0.03 01/02/2015    BNP:  No  results for input(s): PROBNP in the last 72 hours.  D-Dimer: No results for input(s): DDIMER in the last 72 hours.  CBG:  Recent Labs  03/31/15 1255  GLUCAP >600*    Hemoglobin A1C: No results for input(s): HGBA1C in the last 72 hours.  Lipid Panel: No results for input(s): CHOL, HDL, LDLCALC, TRIG, CHOLHDL, LDLDIRECT in the last 72 hours.  Thyroid Function Tests: No results for input(s): TSH, T4TOTAL, FREET4, T3FREE, THYROIDAB in the last 72 hours.  Anemia Panel: No results for input(s): VITAMINB12, FOLATE, FERRITIN, TIBC, IRON, RETICCTPCT in the last 72 hours.  Coagulation:  Recent Labs  03/31/15 0746  LABPROT 19.5*  INR 1.64*    Urine Drug Screen: Drugs of Abuse:  No results found for: LABOPIA, COCAINSCRNUR, LABBENZ, AMPHETMU, THCU, LABBARB  Alcohol Level: No results for input(s): ETH in the last 72 hours.  Urinalysis:    Component Value Date/Time   COLORURINE YELLOW 09/07/2010 1517   APPEARANCEUR TURBID* 09/07/2010 1517   LABSPEC 1.016 09/07/2010 1517   PHURINE 7.0 09/07/2010 1517   GLUCOSEU >1000* 09/07/2010 1517   HGBUR LARGE* 09/07/2010 1517   BILIRUBINUR NEGATIVE 09/07/2010 1517   KETONESUR NEGATIVE 09/07/2010 1517   PROTEINUR >300* 09/07/2010 1517   UROBILINOGEN 0.2 09/07/2010 1517   NITRITE NEGATIVE 09/07/2010 1517   LEUKOCYTESUR LARGE* 09/07/2010 1517    Imaging results:  Ct Soft Tissue Neck W Contrast  03/31/2015  CLINICAL DATA:  LEFT neck swelling for 2 weeks, 2 days of RIGHT neck swelling. Pain. History of diabetes, end-stage renal disease on dialysis, status post kidney pancreatic transplant. EXAM: CT NECK WITH CONTRAST TECHNIQUE: Multidetector CT imaging of the neck was performed using the standard protocol following the bolus administration of intravenous contrast. CONTRAST:  72mL OMNIPAQUE IOHEXOL 300 MG/ML  SOLN COMPARISON:  None. FINDINGS: Pharynx and larynx: Prominent uvula partially effacing the airway, palatine tonsils are not enlarged  or inflamed. Small retropharyngeal effusion without discrete abscess. Mild hypo pharyngeal edema. No abscess. Larynx is normal. Salivary glands: Normal. Thyroid: Normal. Lymph nodes: Lymphadenopathy throughout the neck, including round LEFT supraclavicular 13 mm short axis lymph node, 9 mm short axis RIGHT paratracheal lymph node. Rounded RIGHT level IIa 10 mm lymph node. Vascular: Moderate calcific atherosclerosis of the carotid bulbs, advanced for age. 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 Enlarged patent bilateral external jugular veins. Limited intracranial: Normal. Visualized orbits: Normal. Mastoids and visualized paranasal sinuses: Well aerated. 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 Multiple absent teeth. Upper chest: Pulmonary vascular congestion, suspected cardiomegaly. Partially imaged mediastinal lymphadenopathy. IMPRESSION: Mild pharyngitis. Prominent uvula partially effaces the airway. Small retropharyngeal effusion without abscess. Mild lymphadenopathy throughout the neck, partially imaged mediastinal lymphadenopathy for which dedicated CT of the chest with contrast is recommended. Partially imaged cardiomegaly and pulmonary vascular congestion. Chronically occluded bilateral internal jugular veins, enlarged patent external jugular veins. Electronically Signed   By: Elon Alas M.D.   On: 03/31/2015 03:20   Ct Chest W Contrast  03/31/2015  CLINICAL DATA:  RIGHT side neck pain,swelling x 2 days; pt had CT Neck done, which was suggestive of lymphadenopathy; pt has dry cough,denies any chest painESRD pt, to be dialyzed later today EXAM: CT CHEST WITH CONTRAST TECHNIQUE: Multidetector CT imaging of the chest was performed during intravenous contrast administration. CONTRAST:  54mL OMNIPAQUE IOHEXOL 300 MG/ML  SOLN COMPARISON:  None. FINDINGS: The central airways are patent. There is no focal  consolidation, pleural effusion or  pneumothorax. There is a 6 mm subpleural left lower lobe pulmonary nodule (image 37/ series 3). There is mild bilateral axillary lymphadenopathy. The largest left axillary lymph node measures 12 mm. The largest right axillary lymph node measures 12.6 mm. There is mediastinal lymphadenopathy with the largest right upper paratracheal lymph node measuring 12.5 mm. There is an enlarged subcarinal lymph node measuring 2 cm. There is supraclavicular lymphadenopathy with the largest right supraclavicular lymph node measuring 12 mm. The largest left infraclavicular lymph node measures 13.8 mm. There is a spiculated masslike area in the medial aspect of the right pectoralis major muscle with ill-defined margins and haziness in the surrounding fat with the overall area measuring 5.3 x 2.3 cm. The heart size is normal. There is coronary artery atherosclerosis in the LAD. There are numerous left chest wall collaterals vessels. There is no pericardial effusion. The thoracic aorta is normal in caliber. Review of bone windows demonstrates no focal lytic or sclerotic lesions. Limited non-contrast images of the upper abdomen were obtained. The adrenal glands appear normal. The remainder of the visualized abdominal organs are unremarkable. IMPRESSION: 1. Nonspecific bilateral supraclavicular/infraclavicular, axillary and mediastinal lymphadenopathy. Spiculated masslike area in the medial aspect of the right pectoralis major muscle with ill-defined margins. The appearance is most concerning for malignancy until proven otherwise. Malignancy such as lymphoma or metastatic disease are the 2 main considerations. Tissue diagnosis recommended. 2. Nonspecific 6 mm subpleural left lower lobe pulmonary nodule. If the patient is at high risk for bronchogenic carcinoma, follow-up chest CT at 6-12 months is recommended. If the patient is at low risk for bronchogenic carcinoma, follow-up chest CT at 12 months is  recommended. This recommendation follows the consensus statement: Guidelines for Management of Small Pulmonary Nodules Detected on CT Scans: A Statement from the [employer] as published in Radiology 2005;237:395-400. Electronically Signed   By: Kathreen Devoid   On: 03/31/2015 08:47    EKG: EKG Interpretation  Date/Time:    Ventricular Rate:    PR Interval:    QRS Duration:   QT Interval:    QTC Calculation:   R Axis:     Text Interpretation:     Antibiotics: Antibiotics Given (last 72 hours)    None      Anti-infectives    None     Consults:  IR, nephrology   Assessment & Plan by Problem: Principal Problem:   Diffuse lymphadenopathy Active Problems:   DM (diabetes mellitus), type 1 with renal complications ([place])   History of simultaneous kidney and pancreas transplant ([place])   End-stage renal disease on hemodialysis (HCC)   Chronic diastolic heart failure (HCC)   History of DVT of lower extremity  Diffuse lymphadenopathy Seen on CT concerning for lymphoma/metastatic disease.  Spoke with IR and recommending CT abd/pelvis but will likely need to hold off until tomorrow given already received IV contrast load today.  She is scheduled for HD today.   -LN biopsy tomorrow -hold coumadin in preparation for biopsy  -LDH, uric acid, HIV, SPEP pending -CT Abd/pelvis tomorrow   H/o LLE DVT 12/2014 Was on coumadin but INR subtherapeutic of 1.64. -will hold coumadin while awaiting LN biopsy tomorrow   HFpEF Stable. TTE 03/2014 with LVEF 60% and mild LVH. AV sclerosis w/o stenosis.  -monitor  ESRD on HD Pt HD on TTS follows with Dr. Lorrene Reid.  H/o failed renal and pancreatic transplant.  Potassium 3.8 today.  New left AVG in 12/2014.  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  Lab Results  Component Value Date   CREATININE 6.86* 03/30/2015  -Renal function panel in AM  -renal consulted, HD today    Hypertension  Stable.  -cont home meds   Diabetes Mellitus I Pt with  multiple admissions for DKA.   Lab Results  Component Value Date   HGBA1C 11.8* 12/01/2014  -HA1C -SSI  FEN  Fluids-None Electrolytes-Replete if needed.  Nutrition- Renal/CarbM, NPO after MN  VTE prophylaxis  -SCDs  Disposition Disposition deferred at this time, awaiting improvement of current medical problems. Anticipated discharge in approximately 1-2 day(s).  Also consult [facility] for multiple admissions.    Emergency Contact Contact Information    Name Relation Home Work Mobile   Stimpson,Grady Significant other [place] Mother [phone]        The patient does have a current PCP [name], MD) and does need an [facility] hospital follow-up appointment after discharge.  Signed Jones Bales, MD PGY-3, Internal Medicine Teaching Service 03/31/2015, 3:04 PM

## 2015-03-31 NOTE — Progress Notes (Signed)
Paged Dr. Genene Churn d/t CBG reading of >600. Call returned but per Dr. Genene Churn will call back with further orders.

## 2015-03-31 NOTE — ED Notes (Signed)
Admitting at bedside 

## 2015-03-31 NOTE — Consult Note (Signed)
[facility] Renal Consultation Note    Indication for Consultation:  Management of ESRD/hemodialysis; anemia, hypertension/volume and secondary hyperparathyroidism PCP:  HPI: [name] is a 44 y.o. female with ESRD s/p kidney/pancreas transplant 2004 now subsequent failure back on HD 2008, hx multiple admissions for DKA, hx CVA, pulmonary edema.  At one point was supposed to have primary care with [facility] at [facility] but never went. [facility] has had to manage her coumadin.  Pt has been noticing left neck pain for several weeks ago.that seemed to get better and then worse..  She presented to the ED last night with right sided neck pain She has had diminished appetite, but no fever, chills, SOB, CP, swallowing problems, N, V, D or problems with dialysis.  She has pain over left thigh graft ever since it was placed (graft was placed in anticipation of right thigh graft failure).  She has been leaving below edw which has gradually been lowered. EDW was 81 In Sept and now lowered to 66.5.  Her post HD weight Tuesday was 65.6 with a net UF of 6 L.  Past Medical History  Diagnosis Date  . Hyperlipidemia   . Alopecia   . Obesity   . Iron deficiency   . RLS (restless legs syndrome)   . Injury of conjunctiva and corneal abrasion of right eye without foreign body   . Cellulitis   . Anemia   . Blood transfusion 2004    "when I had my transplant"  . Migraine   . Hyperparathyroidism, secondary ([place])   . Diabetic retinopathy   . Hypertension     takes Metoprolol and Exforge daily  . Depression     takes Klonopin nightly  . Diabetes mellitus type 1 (HCC)     Pt states diagnosed at age 48 with prior episodes of DKA. S/p pancreatic transplant   . GERD (gastroesophageal reflux disease)     takes Protonix daily  . Bronchitis   . Migraine   . Asthma   . Emphysema of lung ([place])   . Angina     none in past 2 years.  . Stomach pain     Hx: of chronic  . Pneumonia  2012    "double"  . Dialysis patient ([place])     tues,thurs,sat  . History of simultaneous kidney and pancreas transplant ([place]) 08/01/2011    Transplant kidney failed and went back on HD in 2008, pancreas then failed in 2014 and she has been transitioned off of her immunosuppression medications in late 2014 and early 2015.  Surgery was done at [facility].     . DKA (diabetic ketoacidoses) ([place]) 12/01/2014  . Diastolic CHF, acute on chronic ([place]) 01/02/2015  . Stroke [facility]) 03/2014    ? TIA  . ESRD (end stage renal disease) [facility])     S/p pancreatic and kidney transplant, but back on HD 2008, dialysis at [place], Sat   Past Surgical History  Procedure Laterality Date  . 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 Thyroglobulin      x 7  . Av fistula placement      right upper arm  . Cholecystectomy  1995  .  hd graft placement/removal      "had 2 in my left upper arm"  . Eye surgery    . Retinopathy surgery    . Tooth extraction  10/10/11    X's two  .  Insertion of dialysis catheter  12/08/2011    Procedure: INSERTION OF DIALYSIS CATHETER;  Surgeon: Angelia Mould, MD;  Location: [place];  Service: Vascular;  Laterality: Left;  . Av fistula placement  01/22/2012    Procedure: INSERTION OF ARTERIOVENOUS (AV) GORE-TEX GRAFT ARM;  Surgeon: Angelia Mould, MD;  Location: [place];  Service: Vascular;  Laterality: Left;  Ultrasound guided.  . Av fistula placement  03/14/2012    Procedure: INSERTION OF ARTERIOVENOUS (AV) GORE-TEX GRAFT THIGH;  Surgeon: Angelia Mould, MD;  Location: [place];  Service: Vascular;  Laterality: Right;  . False aneurysm repair Right 01/02/2013    Procedure: Excision of lymphocele in right thigh.;  Surgeon: Angelia Mould, MD;  Location: [place];  Service: Vascular;  Laterality: Right;  . Carpal tunnel release Right 03/02/2013    Procedure: CARPAL TUNNEL RELEASE;  Surgeon: Tennis Must, MD;  Location: [place];  Service: Orthopedics;  Laterality: Right;  . Flexible sigmoidoscopy N/A 03/24/2013    Procedure: FLEXIBLE SIGMOIDOSCOPY;  Surgeon: Cleotis Nipper, MD;  Location: [facility] ENDOSCOPY;  Service: Endoscopy;  Laterality: N/A;  . Revision of arteriovenous goretex graft Right 04/02/2013    Procedure: REPAIR OF PSEUDOANEURYSM OF ARTERIOVENOUS GORETEX GRAFT;  Surgeon: Angelia Mould, MD;  Location: [place];  Service: Vascular;  Laterality: Right;  . Carpal tunnel release Left 10/16/2013    Procedure: LEFT CARPAL TUNNEL RELEASE;  Surgeon: Tennis Must, MD;  Location: [place];  Service: Orthopedics;  Laterality: Left;  . Unilateral upper extremeity angiogram N/A 11/12/2011    Procedure: UNILATERAL UPPER Anselmo Rod;  Surgeon: Angelia Mould, MD;  Location: [facility] CATH LAB;  Service: Cardiovascular;  Laterality: N/A;  . Fistulogram Left 03/03/2012    Procedure: FISTULOGRAM;  Surgeon: Angelia Mould, MD;  Location: [facility] CATH LAB;  Service: Cardiovascular;  Laterality: Left;  . Av fistula placement Left 01/14/2015    Procedure: INSERTION OF LEFT ARTERIOVENOUS (AV) GORE-TEX GRAFT THIGH;  Surgeon: Angelia Mould, MD;  Location: [facility] OR;  Service: Vascular;  Laterality: Left;   Family History  Problem Relation Age of Onset  . Hypertension Mother   . Kidney disease Mother   . Diabetes Mother   . Hypertension Father   . Kidney disease Father   . Diabetes Father    Social History:  reports that she has never smoked. She has never used smokeless tobacco. She reports that she does not drink alcohol or use illicit drugs. Allergies  Allergen Reactions  . Depakote [Divalproex Sodium] Other (See Comments)    Pt gets "delirious"  . Morphine And Related Nausea And Vomiting and Other (See Comments)    "makes me delirious"  . Penicillins Anaphylaxis    Tolerated Zosyn Dec 2014  . Tramadol Nausea And Vomiting  . Imitrex [Sumatriptan] Other (See Comments)     seizures  . Vicodin [Hydrocodone-Acetaminophen] Itching and Rash   Prior to Admission medications   Medication Sig Start Date End Date Taking? Authorizing Provider  albuterol (PROVENTIL HFA;VENTOLIN HFA) 108 (90 BASE) MCG/ACT inhaler Inhale 2 puffs into the lungs every 4 (four) hours as needed for wheezing or shortness of breath (as per home regimen). 02/20/13  Yes Kinnie Feil, MD  amLODipine (NORVASC) 10 MG tablet Take 10 mg by mouth at bedtime.  11/17/14  Yes Historical Provider, MD  aspirin 325 MG tablet Take 1 tablet (325 mg total) by mouth daily. 05/20/14  Yes Velvet Bathe, MD  calcium  acetate (PHOSLO) 667 MG capsule Take 1 capsule (667 mg total) by mouth 2 (two) times daily. Patient taking differently: Take 2,001 mg by mouth 3 (three) times daily with meals.  11/16/14  Yes Theodis Blaze, MD  clonazePAM (KLONOPIN) 0.5 MG tablet Take 1 mg by mouth at bedtime.    Yes Historical Provider, MD  Darbepoetin Alfa (ARANESP) 40 MCG/0.4ML SOSY injection Inject 0.4 mLs (40 mcg total) into the vein every Thursday with hemodialysis. 08/05/14  Yes Geradine Girt, DO  doxercalciferol (HECTOROL) 4 MCG/2ML injection Inject 1 mL (2 mcg total) into the vein Every Tuesday,Thursday,and Saturday with dialysis. 08/05/14  Yes Geradine Girt, DO  gabapentin (NEURONTIN) 100 MG capsule Take 100 mg by mouth daily.    Yes Historical Provider, MD  guaiFENesin (MUCINEX) 600 MG 12 hr tablet Take 1 tablet (600 mg total) by mouth 2 (two) times daily. 01/07/15  Yes Barton Dubois, MD  insulin glargine (LANTUS) 100 UNIT/ML injection Inject 0.07 mLs (7 Units total) into the skin 2 (two) times daily. 08/05/14  Yes Geradine Girt, DO  insulin lispro (HUMALOG KWIKPEN) 100 UNIT/ML KiwkPen Inject 6 Units into the skin 2 (two) times daily.    Yes Historical Provider, MD  lidocaine (LIDODERM) 5 % Place 1 patch onto the skin daily. Remove & Discard patch within 12 hours or as directed by MD 12/06/14  Yes Kelvin Cellar, MD   metoCLOPramide (REGLAN) 10 MG tablet Take 1 tablet (10 mg total) by mouth 4 (four) times daily -  before meals and at bedtime. 12/06/14  Yes Kelvin Cellar, MD  metoprolol succinate (TOPROL-XL) 50 MG 24 hr tablet Take 1 tablet (50 mg total) by mouth daily. Take with or immediately following a meal. 01/07/15  Yes Barton Dubois, MD  montelukast (SINGULAIR) 10 MG tablet Take 10 mg by mouth at bedtime.   Yes Historical Provider, MD  multivitamin (RENA-VIT) TABS tablet Take 1 tablet by mouth daily. 08/05/14  Yes Geradine Girt, DO  Nutritional Supplements (FEEDING SUPPLEMENT, NEPRO CARB STEADY,) LIQD Take 237 mLs by mouth 2 (two) times daily between meals. 04/23/14  Yes Orson Eva, MD  Oxycodone HCl 10 MG TABS Take 10 mg by mouth 3 (three) times daily as needed (for pain).  03/23/15  Yes Historical Provider, MD  oxyCODONE-acetaminophen (PERCOCET/ROXICET) 5-325 MG tablet Take 1-2 tablets by mouth every 6 (six) hours as needed for severe pain. 03/02/15  Yes Angelia Mould, MD  pantoprazole (PROTONIX) 40 MG tablet Take 1 tablet (40 mg total) by mouth at bedtime. Patient taking differently: Take 40 mg by mouth daily. Takes daily in the afternoon 08/05/14  Yes Geradine Girt, DO  promethazine (PHENERGAN) 25 MG tablet Take 1 tablet (25 mg total) by mouth every 6 (six) hours as needed for nausea or vomiting. 12/06/14  Yes Kelvin Cellar, MD  simvastatin (ZOCOR) 20 MG tablet Take 1 tablet (20 mg total) by mouth at bedtime. 05/20/14  Yes Velvet Bathe, MD  warfarin (COUMADIN) 5 MG tablet Take 0.5 tablets (2.5 mg total) by mouth daily at 6 PM. Patient taking differently: Take 5-7.5 mg by mouth See admin instructions. Take 1 tablet on Tuesday, Thursday and Saturday then take 1 and 1/2 tablets on Monday, Wednesday, Friday and Sunday 01/23/15  Yes Ulyses Amor, PA-C  zolpidem (AMBIEN) 5 MG tablet Take 5 mg by mouth at bedtime as needed. sleep 02/09/14  Yes Historical Provider, MD  oxyCODONE-acetaminophen  (PERCOCET/ROXICET) 5-325 MG per tablet Take 1-2 tablets by mouth  at bedtime as needed. Patient not taking: Reported on 03/30/2015 02/09/15   Angelia Mould, MD   Current Facility-Administered Medications  Medication Dose Route Frequency Provider Last Rate Last Dose  . [START ON 04/02/2015] doxercalciferol (HECTOROL) injection 1 mcg  1 mcg Intravenous Q T,Th,Sa-HD Alric Seton, PA-C      . Derrill Memo ON 04/02/2015] ferric gluconate (NULECIT) 125 mg in sodium chloride 0.9 % 100 mL IVPB  125 mg Intravenous Q T,Th,Sa-HD Alric Seton, PA-C       Current Outpatient Prescriptions  Medication Sig Dispense Refill  . albuterol (PROVENTIL HFA;VENTOLIN HFA) 108 (90 BASE) MCG/ACT inhaler Inhale 2 puffs into the lungs every 4 (four) hours as needed for wheezing or shortness of breath (as per home regimen). 1 Inhaler 0  . amLODipine (NORVASC) 10 MG tablet Take 10 mg by mouth at bedtime.   11  . aspirin 325 MG tablet Take 1 tablet (325 mg total) by mouth daily. 30 tablet 0  . calcium acetate (PHOSLO) 667 MG capsule Take 1 capsule (667 mg total) by mouth 2 (two) times daily. (Patient taking differently: Take 2,001 mg by mouth 3 (three) times daily with meals. ) 30 capsule 0  . clonazePAM (KLONOPIN) 0.5 MG tablet Take 1 mg by mouth at bedtime.     . Darbepoetin Alfa (ARANESP) 40 MCG/0.4ML SOSY injection Inject 0.4 mLs (40 mcg total) into the vein every Thursday with hemodialysis. 8.4 mL   . doxercalciferol (HECTOROL) 4 MCG/2ML injection Inject 1 mL (2 mcg total) into the vein Every Tuesday,Thursday,and Saturday with dialysis. 2 mL   . gabapentin (NEURONTIN) 100 MG capsule Take 100 mg by mouth daily.     Marland Kitchen guaiFENesin (MUCINEX) 600 MG 12 hr tablet Take 1 tablet (600 mg total) by mouth 2 (two) times daily. 30 tablet 0  . insulin glargine (LANTUS) 100 UNIT/ML injection Inject 0.07 mLs (7 Units total) into the skin 2 (two) times daily. 10 mL 11  . insulin lispro (HUMALOG KWIKPEN) 100 UNIT/ML KiwkPen Inject 6 Units  into the skin 2 (two) times daily.     Marland Kitchen lidocaine (LIDODERM) 5 % Place 1 patch onto the skin daily. Remove & Discard patch within 12 hours or as directed by MD 10 patch 0  . metoCLOPramide (REGLAN) 10 MG tablet Take 1 tablet (10 mg total) by mouth 4 (four) times daily -  before meals and at bedtime. 60 tablet 0  . metoprolol succinate (TOPROL-XL) 50 MG 24 hr tablet Take 1 tablet (50 mg total) by mouth daily. Take with or immediately following a meal. 30 tablet 1  . montelukast (SINGULAIR) 10 MG tablet Take 10 mg by mouth at bedtime.    . multivitamin (RENA-VIT) TABS tablet Take 1 tablet by mouth daily. 30 tablet 0  . Nutritional Supplements (FEEDING SUPPLEMENT, NEPRO CARB STEADY,) LIQD Take 237 mLs by mouth 2 (two) times daily between meals. 60 Can 0  . Oxycodone HCl 10 MG TABS Take 10 mg by mouth 3 (three) times daily as needed (for pain).   0  . oxyCODONE-acetaminophen (PERCOCET/ROXICET) 5-325 MG tablet Take 1-2 tablets by mouth every 6 (six) hours as needed for severe pain. 30 tablet 0  . pantoprazole (PROTONIX) 40 MG tablet Take 1 tablet (40 mg total) by mouth at bedtime. (Patient taking differently: Take 40 mg by mouth daily. Takes daily in the afternoon) 30 tablet 0  . promethazine (PHENERGAN) 25 MG tablet Take 1 tablet (25 mg total) by mouth every 6 (six) hours as  needed for nausea or vomiting. 10 tablet 0  . simvastatin (ZOCOR) 20 MG tablet Take 1 tablet (20 mg total) by mouth at bedtime. 30 tablet 0  . warfarin (COUMADIN) 5 MG tablet Take 0.5 tablets (2.5 mg total) by mouth daily at 6 PM. (Patient taking differently: Take 5-7.5 mg by mouth See admin instructions. Take 1 tablet on Tuesday, Thursday and Saturday then take 1 and 1/2 tablets on Monday, Wednesday, Friday and Sunday) 60 tablet 0  . zolpidem (AMBIEN) 5 MG tablet Take 5 mg by mouth at bedtime as needed. sleep    . oxyCODONE-acetaminophen (PERCOCET/ROXICET) 5-325 MG per tablet Take 1-2 tablets by mouth at bedtime as needed. (Patient  not taking: Reported on 03/30/2015) 40 tablet 0   Labs: Basic Metabolic Panel:  Recent Labs Lab 03/30/15 1944  NA 134*  K 3.8  CL 97*  CO2 26  GLUCOSE 307*  BUN 60*  CREATININE 6.86*  CALCIUM 8.5*   Liver Function Tests:  Recent Labs Lab 03/30/15 1944  AST 15  ALT 10*  ALKPHOS 145*  BILITOT 0.7  PROT 7.7  ALBUMIN 2.8*  CBC:  Recent Labs Lab 03/30/15 1944  WBC 6.5  NEUTROABS 4.9  HGB 8.9*  HCT 29.9*  MCV 94.9  PLT 159   Studies/Results: Ct Soft Tissue Neck W Contrast  03/31/2015  CLINICAL DATA:  LEFT neck swelling for 2 weeks, 2 days of RIGHT neck swelling. Pain. History of diabetes, end-stage renal disease on dialysis, status post kidney pancreatic transplant. EXAM: CT NECK WITH CONTRAST TECHNIQUE: Multidetector CT imaging of the neck was performed using the standard protocol following the bolus administration of intravenous contrast. CONTRAST:  2mL OMNIPAQUE IOHEXOL 300 MG/ML  SOLN COMPARISON:  None. FINDINGS: Pharynx and larynx: Prominent uvula partially effacing the airway, palatine tonsils are not enlarged or inflamed. Small retropharyngeal effusion without discrete abscess. Mild hypo pharyngeal edema. No abscess. Larynx is normal. Salivary glands: Normal. Thyroid: Normal. Lymph nodes: Lymphadenopathy throughout the neck, including round LEFT supraclavicular 13 mm short axis lymph node, 9 mm short axis RIGHT paratracheal lymph node. Rounded RIGHT level IIa 10 mm lymph node. Vascular: Moderate calcific atherosclerosis of the carotid bulbs, advanced for age. 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 Enlarged patent bilateral external jugular veins. Limited intracranial: Normal. Visualized orbits: Normal. Mastoids and visualized paranasal sinuses: Well aerated. 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 Multiple absent teeth. Upper chest: Pulmonary vascular congestion,  suspected cardiomegaly. Partially imaged mediastinal lymphadenopathy. IMPRESSION: Mild pharyngitis. Prominent uvula partially effaces the airway. Small retropharyngeal effusion without abscess. Mild lymphadenopathy throughout the neck, partially imaged mediastinal lymphadenopathy for which dedicated CT of the chest with contrast is recommended. Partially imaged cardiomegaly and pulmonary vascular congestion. Chronically occluded bilateral internal jugular veins, enlarged patent external jugular veins. Electronically Signed   By: Elon Alas M.D.   On: 03/31/2015 03:20   Ct Chest W Contrast  03/31/2015  CLINICAL DATA:  RIGHT side neck pain,swelling x 2 days; pt had CT Neck done, which was suggestive of lymphadenopathy; pt has dry cough,denies any chest painESRD pt, to be dialyzed later today EXAM: CT CHEST WITH CONTRAST TECHNIQUE: Multidetector CT imaging of the chest was performed during intravenous contrast administration. CONTRAST:  38mL OMNIPAQUE IOHEXOL 300 MG/ML  SOLN COMPARISON:  None. FINDINGS: The central airways are patent. There is no focal consolidation, pleural effusion or pneumothorax. There is a 6 mm subpleural left lower lobe pulmonary nodule (image 37/ series 3).  There is mild bilateral axillary lymphadenopathy. The largest left axillary lymph node measures 12 mm. The largest right axillary lymph node measures 12.6 mm. There is mediastinal lymphadenopathy with the largest right upper paratracheal lymph node measuring 12.5 mm. There is an enlarged subcarinal lymph node measuring 2 cm. There is supraclavicular lymphadenopathy with the largest right supraclavicular lymph node measuring 12 mm. The largest left infraclavicular lymph node measures 13.8 mm. There is a spiculated masslike area in the medial aspect of the right pectoralis major muscle with ill-defined margins and haziness in the surrounding fat with the overall area measuring 5.3 x 2.3 cm. The heart size is normal. There is coronary  artery atherosclerosis in the LAD. There are numerous left chest wall collaterals vessels. There is no pericardial effusion. The thoracic aorta is normal in caliber. Review of bone windows demonstrates no focal lytic or sclerotic lesions. Limited non-contrast images of the upper abdomen were obtained. The adrenal glands appear normal. The remainder of the visualized abdominal organs are unremarkable. IMPRESSION: 1. Nonspecific bilateral supraclavicular/infraclavicular, axillary and mediastinal lymphadenopathy. Spiculated masslike area in the medial aspect of the right pectoralis major muscle with ill-defined margins. The appearance is most concerning for malignancy until proven otherwise. Malignancy such as lymphoma or metastatic disease are the 2 main considerations. Tissue diagnosis recommended. 2. Nonspecific 6 mm subpleural left lower lobe pulmonary nodule. If the patient is at high risk for bronchogenic carcinoma, follow-up chest CT at 6-12 months is recommended. If the patient is at low risk for bronchogenic carcinoma, follow-up chest CT at 12 months is recommended. This recommendation follows the consensus statement: Guidelines for Management of Small Pulmonary Nodules Detected on CT Scans: A Statement from the [employer] as published in Radiology 2005;237:395-400. Electronically Signed   By: Kathreen Devoid   On: 03/31/2015 08:47    ROS: As per HPI otherwise negative.  Physical Exam: Filed Vitals:   03/31/15 0715 03/31/15 0730 03/31/15 1030 03/31/15 1115  BP: 157/74 156/74 143/66 140/70  Pulse: 98 96 92 96  Temp:      TempSrc:      Resp:      Height:      Weight:      SpO2: 100% 100% 99% 99%     General: Well developed, well nourished, in no acute distress. Head: Normocephalic, atraumatic, sclera non-icteric, mucus membranes are moist Neck: Supple with bilateral soft swelling neck/supraclavicular area Chest: tenderness over right upper chest on palpation Lungs: Clear bilaterally to  auscultation without wheezes, rales, or rhonchi. Breathing is unlabored. Heart: RRR with S1 S2.  Abdomen: Soft, non-tender, non-distended with normoactive bowel sounds. Lower extremities: tr RLE edema Neuro: Alert and oriented X 3. Moves all extremities spontaneously. Psych:  Responds to questions appropriately with a normal affect. Dialysis Access: right and left thigh grafts - using right;  left placed in August 19th in anticipation of needing in the future - both patent  Dialysis Orders: [facility]: [facility] 3.75 hr 400/600 2 K 2.5 Ca right thigh AVGG left thigh AVGG Hectorol 1 venofer 100 x 2 then 50 per week,  Mircera 225 - to be given today - last got 200 10/6  Assessment/Plan: 1.  Chest mass with lymphadenopathy - admitted for work up - does not really have a primary care physician; Her nephrologist Dr. Lorrene Reid only manages renal related issues. 2.  ESRD -  TTS plan HD today keep on schedule- have been lowering EDW - unintentional weight loss k 3.8 last evening - repeat pre  Hd today  3.  Hypertension/volume  - titrate edw down 4.  Anemia  - Hgb 8.9 on max ESA  - repleting  5.  Metabolic bone disease -  Continue Hectorol 1, binders 6.  Nutrition - npo pending plans - unintentional weight loss as per #2 7. Chronic coumadin- hx left CFV DVT 12/2014- after last d/c was supposed to call PCP Bernadette Hoit in [facility] for management but nephrology ended up managing 8. DM - per primary  [name], PA-C [place] [phone] 03/31/2015, 12:13 PM   Pt seen, examined and agree w A/P as above.  Kelly Splinter MD [employer] pager [phone]    cell [phone] 03/31/2015, 4:44 PM

## 2015-04-01 ENCOUNTER — Encounter (HOSPITAL_COMMUNITY): Payer: Self-pay | Admitting: Radiology

## 2015-04-01 ENCOUNTER — Inpatient Hospital Stay (HOSPITAL_COMMUNITY): Payer: Medicare Other

## 2015-04-01 LAB — BASIC METABOLIC PANEL
ANION GAP: 15 (ref 5–15)
Anion gap: 14 (ref 5–15)
BUN: 24 mg/dL — AB (ref 6–20)
BUN: 28 mg/dL — ABNORMAL HIGH (ref 6–20)
CHLORIDE: 97 mmol/L — AB (ref 101–111)
CHLORIDE: 97 mmol/L — AB (ref 101–111)
CO2: 23 mmol/L (ref 22–32)
CO2: 23 mmol/L (ref 22–32)
CREATININE: 4.64 mg/dL — AB (ref 0.44–1.00)
Calcium: 8.7 mg/dL — ABNORMAL LOW (ref 8.9–10.3)
Calcium: 8.5 mg/dL — ABNORMAL LOW (ref 8.9–10.3)
Creatinine, Ser: 4.21 mg/dL — ABNORMAL HIGH (ref 0.44–1.00)
GFR calc Af Amer: 14 mL/min — ABNORMAL LOW (ref >60)
GFR calc non Af Amer: 12 mL/min — ABNORMAL LOW (ref >60)
GFR calc non Af Amer: 11 mL/min — ABNORMAL LOW (ref >60)
GFR, EST AFRICAN AMERICAN: 12 mL/min — AB (ref >60)
GLUCOSE: 138 mg/dL — AB (ref 65–99)
Glucose, Bld: 256 mg/dL — ABNORMAL HIGH (ref 65–99)
POTASSIUM: 3.7 mmol/L (ref 3.5–5.1)
POTASSIUM: 3.8 mmol/L (ref 3.5–5.1)
SODIUM: 135 mmol/L (ref 135–145)
Sodium: 134 mmol/L — ABNORMAL LOW (ref 135–145)

## 2015-04-01 LAB — PROTEIN ELECTROPHORESIS, SERUM
A/G Ratio: 0.7 (ref 0.7–1.7)
Albumin ELP: 3.1 g/dL (ref 2.9–4.4)
Alpha-1-Globulin: 0.3 g/dL (ref 0.0–0.4)
Alpha-2-Globulin: 0.7 g/dL (ref 0.4–1.0)
Beta Globulin: 0.9 g/dL (ref 0.7–1.3)
GAMMA GLOBULIN: 2.3 g/dL — AB (ref 0.4–1.8)
Globulin, Total: 4.2 g/dL — ABNORMAL HIGH (ref 2.2–3.9)
TOTAL PROTEIN ELP: 7.3 g/dL (ref 6.0–8.5)

## 2015-04-01 LAB — HIV ANTIBODY (ROUTINE TESTING W REFLEX): HIV SCREEN 4TH GENERATION: NONREACTIVE

## 2015-04-01 LAB — GLUCOSE, CAPILLARY
GLUCOSE-CAPILLARY: 164 mg/dL — AB (ref 65–99)
GLUCOSE-CAPILLARY: 203 mg/dL — AB (ref 65–99)
GLUCOSE-CAPILLARY: 256 mg/dL — AB (ref 65–99)
Glucose-Capillary: 228 mg/dL — ABNORMAL HIGH (ref 65–99)
Glucose-Capillary: 330 mg/dL — ABNORMAL HIGH (ref 65–99)
Glucose-Capillary: 164 mg/dL — ABNORMAL HIGH (ref 65–99)
Glucose-Capillary: 76 mg/dL (ref 65–99)

## 2015-04-01 LAB — CBC
HCT: 27.3 % — ABNORMAL LOW (ref 36.0–46.0)
HEMOGLOBIN: 8.4 g/dL — AB (ref 12.0–15.0)
MCH: 28.6 pg (ref 26.0–34.0)
MCHC: 30.8 g/dL (ref 30.0–36.0)
MCV: 92.9 fL (ref 78.0–100.0)
Platelets: 160 10*3/uL (ref 150–400)
RBC: 2.94 MIL/uL — AB (ref 3.87–5.11)
RDW: 16.4 % — ABNORMAL HIGH (ref 11.5–15.5)
WBC: 7.5 10*3/uL (ref 4.0–10.5)

## 2015-04-01 LAB — HEMOGLOBIN A1C
HEMOGLOBIN A1C: 10 % — AB (ref 4.8–5.6)
Mean Plasma Glucose: 240 mg/dL

## 2015-04-01 MED ORDER — WARFARIN SODIUM 7.5 MG PO TABS
7.5000 mg | ORAL_TABLET | Freq: Once | ORAL | Status: AC
  Administered 2015-04-01: 7.5 mg via ORAL
  Filled 2015-04-01: qty 1

## 2015-04-01 MED ORDER — ENOXAPARIN SODIUM 80 MG/0.8ML ~~LOC~~ SOLN
70.0000 mg | SUBCUTANEOUS | Status: DC
  Administered 2015-04-01: 70 mg via SUBCUTANEOUS
  Filled 2015-04-01: qty 0.8

## 2015-04-01 MED ORDER — IOHEXOL 300 MG/ML  SOLN
100.0000 mL | Freq: Once | INTRAMUSCULAR | Status: AC | PRN
  Administered 2015-04-01: 100 mL via INTRAVENOUS

## 2015-04-01 MED ORDER — INSULIN ASPART 100 UNIT/ML ~~LOC~~ SOLN
3.0000 [IU] | Freq: Three times a day (TID) | SUBCUTANEOUS | Status: DC
  Administered 2015-04-02 (×2): 3 [IU] via SUBCUTANEOUS

## 2015-04-01 MED ORDER — HYDROMORPHONE HCL 1 MG/ML IJ SOLN
1.0000 mg | Freq: Once | INTRAMUSCULAR | Status: AC
  Administered 2015-04-01: 1 mg via INTRAVENOUS
  Filled 2015-04-01: qty 1

## 2015-04-01 MED ORDER — ENOXAPARIN SODIUM 60 MG/0.6ML ~~LOC~~ SOLN: 1.0000 mg/kg | SUBCUTANEOUS | Status: DC

## 2015-04-01 MED ORDER — IOHEXOL 300 MG/ML  SOLN
25.0000 mL | INTRAMUSCULAR | Status: AC
  Administered 2015-04-01 (×2): 25 mL via ORAL

## 2015-04-01 MED ORDER — WARFARIN - PHARMACIST DOSING INPATIENT
Freq: Every day | Status: DC
  Administered 2015-04-01: 18:00:00

## 2015-04-01 NOTE — Progress Notes (Signed)
Subjective: Pt got dialysis yesterday.  Stating that overnight she still had lots of pain and this is her primary concern.  Has not been able to eat given that she was on insulin drip for blood glucose >1000 (drip was eventually stopped overnight) and then made NPO in anticipation of a biopsy today. Says she is feeling better since getting insulin, and has no other complaints at this time other than pain that seems to be diffusely right-sided.  Patient's God-mother was present at bedside.   Objective: Vital signs in last 24 hours: Filed Vitals:   03/31/15 2339 04/01/15 0409 04/01/15 0700 04/01/15 1000  BP: 121/59  116/55 120/58  Pulse: 85  68 78  Temp: 98 F (36.7 C)  99 F (37.2 C) 98.2 F (36.8 C)  TempSrc: Oral  Oral Oral  Resp: 18  18 18   Height:      Weight:  69.4 kg (153 lb)    SpO2: 100%  100% 100%   Weight change: 3.46 kg (7 lb 10.1 oz)  Intake/Output Summary (Last 24 hours) at 04/01/15 1408 Last data filed at 04/01/15 1000  Gross per 24 hour  Intake    300 ml  Output   3037 ml  Net  -2737 ml   General: African-American female, appears older than age (44), laying in bed in NAD HEENT: Disconjugate gaze at baseline,  Neck: bilateral supraclavicular nodes visible; deferred palpation today as yesterday left node was very tender Cardiovascular: Regular rate and rhythm, there is an audible murmur best heard at the upper left sternal border Pulmonary: normal work of breathing Abdomen: soft, nontender Psych: Alert and oriented to person  Lab Results: CMP Latest Ref Rng 04/01/2015 04/01/2015 03/31/2015  Glucose 65 - 99 mg/dL 256(H) 138(H) 1028(HH)  BUN 6 - 20 mg/dL 28(H) 24(H) 77(H)  Creatinine 0.44 - 1.00 mg/dL 4.64(H) 4.21(H) 8.68(H)  Sodium 135 - 145 mmol/L 135 134(L) 119(LL)  Potassium 3.5 - 5.1 mmol/L 3.8 3.7 6.1(HH)  Chloride 101 - 111 mmol/L 97(L) 97(L) 84(L)  CO2 22 - 32 mmol/L 23 23 23   Calcium 8.9 - 10.3 mg/dL 8.5(L) 8.7(L) 8.0(L)  Total Protein 6.5 - 8.1 g/dL -  - 7.7  Total Bilirubin 0.3 - 1.2 mg/dL - - 0.8  Alkaline Phos 38 - 126 U/L - - 174(H)  AST 15 - 41 U/L - - 32  ALT 14 - 54 U/L - - 15   CBC Latest Ref Rng 04/01/2015 03/30/2015 01/23/2015  WBC 4.0 - 10.5 K/uL 7.5 6.5 6.4  Hemoglobin 12.0 - 15.0 g/dL 8.4(L) 8.9(L) 8.2(L)  Hematocrit 36.0 - 46.0 % 27.3(L) 29.9(L) 25.9(L)  Platelets 150 - 400 K/uL 160 159 156   Urinalysis    Component Value Date/Time   COLORURINE YELLOW 09/07/2010 1517   APPEARANCEUR TURBID* 09/07/2010 1517   LABSPEC 1.016 09/07/2010 1517   PHURINE 7.0 09/07/2010 1517   GLUCOSEU >1000* 09/07/2010 1517   HGBUR LARGE* 09/07/2010 1517   BILIRUBINUR NEGATIVE 09/07/2010 1517   KETONESUR NEGATIVE 09/07/2010 1517   PROTEINUR >300* 09/07/2010 1517   UROBILINOGEN 0.2 09/07/2010 1517   NITRITE NEGATIVE 09/07/2010 1517   LEUKOCYTESUR LARGE* 09/07/2010 1517    Micro Results: No results found for this or any previous visit (from the past 240 hour(s)). Studies/Results: Ct Soft Tissue Neck W Contrast  03/31/2015  CLINICAL DATA:  LEFT neck swelling for 2 weeks, 2 days of RIGHT neck swelling. Pain. History of diabetes, end-stage renal disease on dialysis, status post kidney pancreatic transplant. EXAM: CT  NECK WITH CONTRAST TECHNIQUE: Multidetector CT imaging of the neck was performed using the standard protocol following the bolus administration of intravenous contrast. CONTRAST:  41mL OMNIPAQUE IOHEXOL 300 MG/ML  SOLN COMPARISON:  None. FINDINGS: Pharynx and larynx: Prominent uvula partially effacing the airway, palatine tonsils are not enlarged or inflamed. Small retropharyngeal effusion without discrete abscess. Mild hypo pharyngeal edema. No abscess. Larynx is normal. Salivary glands: Normal. Thyroid: Normal. Lymph nodes: Lymphadenopathy throughout the neck, including round LEFT supraclavicular 13 mm short axis lymph node, 9 mm short axis RIGHT paratracheal lymph node. Rounded RIGHT level IIa 10 mm lymph node. Vascular: Moderate  calcific atherosclerosis of the carotid bulbs, advanced for age. 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 Enlarged patent bilateral external jugular veins. Limited intracranial: Normal. Visualized orbits: Normal. Mastoids and visualized paranasal sinuses: Well aerated. 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 Multiple absent teeth. Upper chest: Pulmonary vascular congestion, suspected cardiomegaly. Partially imaged mediastinal lymphadenopathy. IMPRESSION: Mild pharyngitis. Prominent uvula partially effaces the airway. Small retropharyngeal effusion without abscess. Mild lymphadenopathy throughout the neck, partially imaged mediastinal lymphadenopathy for which dedicated CT of the chest with contrast is recommended. Partially imaged cardiomegaly and pulmonary vascular congestion. Chronically occluded bilateral internal jugular veins, enlarged patent external jugular veins. Electronically Signed   By: Elon Alas M.D.   On: 03/31/2015 03:20   Ct Chest W Contrast  03/31/2015  CLINICAL DATA:  RIGHT side neck pain,swelling x 2 days; pt had CT Neck done, which was suggestive of lymphadenopathy; pt has dry cough,denies any chest painESRD pt, to be dialyzed later today EXAM: CT CHEST WITH CONTRAST TECHNIQUE: Multidetector CT imaging of the chest was performed during intravenous contrast administration. CONTRAST:  20mL OMNIPAQUE IOHEXOL 300 MG/ML  SOLN COMPARISON:  None. FINDINGS: The central airways are patent. There is no focal consolidation, pleural effusion or pneumothorax. There is a 6 mm subpleural left lower lobe pulmonary nodule (image 37/ series 3). There is mild bilateral axillary lymphadenopathy. The largest left axillary lymph node measures 12 mm. The largest right axillary lymph node measures 12.6 mm. There is mediastinal lymphadenopathy with the largest right upper paratracheal lymph node measuring  12.5 mm. There is an enlarged subcarinal lymph node measuring 2 cm. There is supraclavicular lymphadenopathy with the largest right supraclavicular lymph node measuring 12 mm. The largest left infraclavicular lymph node measures 13.8 mm. There is a spiculated masslike area in the medial aspect of the right pectoralis major muscle with ill-defined margins and haziness in the surrounding fat with the overall area measuring 5.3 x 2.3 cm. The heart size is normal. There is coronary artery atherosclerosis in the LAD. There are numerous left chest wall collaterals vessels. There is no pericardial effusion. The thoracic aorta is normal in caliber. Review of bone windows demonstrates no focal lytic or sclerotic lesions. Limited non-contrast images of the upper abdomen were obtained. The adrenal glands appear normal. The remainder of the visualized abdominal organs are unremarkable. IMPRESSION: 1. Nonspecific bilateral supraclavicular/infraclavicular, axillary and mediastinal lymphadenopathy. Spiculated masslike area in the medial aspect of the right pectoralis major muscle with ill-defined margins. The appearance is most concerning for malignancy until proven otherwise. Malignancy such as lymphoma or metastatic disease are the 2 main considerations. Tissue diagnosis recommended. 2. Nonspecific 6 mm subpleural left lower lobe pulmonary nodule. If the patient is at high risk for bronchogenic carcinoma, follow-up chest CT at 6-12 months is recommended. If the patient is at low risk for bronchogenic  carcinoma, follow-up chest CT at 12 months is recommended. This recommendation follows the consensus statement: Guidelines for Management of Small Pulmonary Nodules Detected on CT Scans: A Statement from the [employer] as published in Radiology 2005;237:395-400. Electronically Signed   By: Kathreen Devoid   On: 03/31/2015 08:47   Medications: I have reviewed the patient's current medications. Scheduled Meds: . amLODipine   10 mg Oral QHS  . calcium acetate  667 mg Oral BID WC  . clonazePAM  1 mg Oral QHS  . [START ON 04/07/2015] darbepoetin (ARANESP) injection - DIALYSIS  200 mcg Intravenous Q Thu-HD  . [START ON 04/02/2015] doxercalciferol  1 mcg Intravenous Q T,Th,Sa-HD  . feeding supplement (NEPRO CARB STEADY)  237 mL Oral BID BM  . [START ON 04/02/2015] ferric gluconate (FERRLECIT/NULECIT) IV  125 mg Intravenous Q T,Th,Sa-HD  . gabapentin  100 mg Oral Daily  . guaiFENesin  600 mg Oral BID  . insulin aspart  0-9 Units Subcutaneous TID WC  . insulin aspart  3 Units Subcutaneous TID WC  . insulin glargine  7 Units Subcutaneous BID  . iohexol  25 mL Oral Q1 Hr x 2  . lidocaine  1 patch Transdermal Q24H  . metoCLOPramide  10 mg Oral TID AC & HS  . metoprolol succinate  50 mg Oral Daily  . montelukast  10 mg Oral QHS  . multivitamin  1 tablet Oral Daily  . pantoprazole  40 mg Oral QHS  . 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  Of note, patient had very high blood glucose on admission (>1000) but no lab evidence of anion gap or acidosis.  #Lymphadenopathy: suspected to be lymphoma vs mets given bilateral  supraclavicular node lymphadenopathy and mediastinal lymphadenopathy with spiculated mass in right pectoralis major  -Biopsy will be deferred until CT abd/pelvis with contrast -CT abd/pelvis delayed morning of 04/01/15, so consider biopsy as outpatient per recommendations from IR  #T1DM: >1000 blood glucose on admission, improved to <200 after administration of 20 units novolog, 7 units Lantus and insulin drip -Drip discontinued overnight -Continue Lantus 7 Units BID per home dosing regimen -Continue 3 units insulin aspart with meals   #ESRD on T/Th/Sat HD: -patient received hemodialysis 11/3, hyperkalemia improved from 6.1 --> 3.7  -calcium acetate 667mg  BID with meals -doxercalciferol 45mcg with HD  #Anemia of chronic disease -On 274mcg Darbopoetin Alfa every Thursday during HD to stimulate bone marrow   #SOB: evidence of pulmonary congestion, unclear if this is cardiac in origin vs. asthma vs COPD  -Mucinex 600mg  BID -Singulair 10mg  daily at bedtime -O2 via [place] titrated so that O2 is >94%  #Prior DVT -Currently holding anticoagulation with lovenox in anticipation of biopsy today -Will need to resume home warfarin after biopsy-- if biopsy  ends upbeing done as outpatient, will restart warfarin prior to patient leaving hospitalizations for instructions to hold anticoag medications prior to outpatient biopsy  #Pain: received 5 doses of IV fentanyl in ED -Pain control on floor with 5mg  oxycodone q6 PRN -Consider tramadol if pain remains uncontrolled on Tylenol 650mg  q6 PRN  #Sleep: pt on 1mg  clonzaepam at home daily at bedtime -Continue home dose to avoid benzo withdrawal   #HTN -continue 50mg  metoprolol, 10mg  amlodipine daily  #HLD -continue simvastatin 20mg   #GERD/esophageal dysmotility:  Had barrium swallow in 10/2014 showing evidence of dysmotility, 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  This is a Careers information officer Note.  The care of the patient was discussed with Dr. Berline Lopes and the assessment and plan formulated with their assistance.  Please see their attached note for official documentation of the daily encounter.   LOS: 1 day   Ladell Pier, Med Student 04/01/2015, 2:08 PM

## 2015-04-01 NOTE — Progress Notes (Signed)
ANTICOAGULATION CONSULT NOTE - Follow Up Consult  Pharmacy Consult for Lovenox and Coumadin Indication: hx DVT  Allergies  Allergen Reactions  . Depakote [Divalproex Sodium] Other (See Comments)    Pt gets "delirious"  . Morphine And Related Nausea And Vomiting and Other (See Comments)    "makes me delirious"  . Penicillins Anaphylaxis    Tolerated Zosyn Dec 2014  . Tramadol Nausea And Vomiting  . Imitrex [Sumatriptan] Other (See Comments)    seizures  . Vicodin [Hydrocodone-Acetaminophen] Itching and Rash    Patient Measurements: Height: 5\' 1"  (154.9 cm) Weight: 153 lb (69.4 kg) IBW/kg (Calculated) : 47.8  Vital Signs: Temp: 98.2 F (36.8 C) (11/04 1000) Temp Source: Oral (11/04 1000) BP: 120/58 mmHg (11/04 1000) Pulse Rate: 78 (11/04 1000)  Labs:  Recent Labs  03/30/15 1944 03/31/15 0746 03/31/15 1555 04/01/15 0043 04/01/15 0419  HGB 8.9*  --   --   --  8.4*  HCT 29.9*  --   --   --  27.3*  PLT 159  --   --   --  160  LABPROT  --  19.5*  --   --   --   INR  --  1.64*  --   --   --   CREATININE 6.86*  --  8.68* 4.21* 4.64*    Estimated Creatinine Clearance: 14.1 mL/min (by C-G formula based on Cr of 4.64).  Assessment: 44yof on coumadin pta for hx DVT. Coumadin held on admission (last dose taken 11/2 at home) for possible neck biopsy, however, biopsy now cancelled and coumadin to resume. INR subtherapeutic at 1.64 so she will be bridged with therapeutic lovenox. ESRD on HD, weight = 69.4kg.  Home dose: 7.5mg  daily except 5mg  TTSat  Goal of Therapy:  INR 2-3 Monitor platelets by anticoagulation protocol: Yes   Plan:  1) Lovenox 70mg  sq q24 (1mg /kg/q24) 2) Coumadin 7.5mg  x 1 3) Daily INR if still here in AM  Deboraha Sprang 04/01/2015,3:30 PM

## 2015-04-01 NOTE — Progress Notes (Signed)
Temp 102.9 this evening. MD on call notified. Discharge orders cancelled at this time. Patient given prn tylenol. Will monitor.  Joellen Jersey, RN.

## 2015-04-01 NOTE — Progress Notes (Signed)
Subjective:  Co of neck discomfort/ HD yest  On schedule / mother in room   Objective Vital signs in last 24 hours: Filed Vitals:   03/31/15 2246 03/31/15 2339 04/01/15 0409 04/01/15 0700  BP: 93/54 121/59  116/55  Pulse: 75 85  68  Temp:  98 F (36.7 C)  99 F (37.2 C)  TempSrc:  Oral  Oral  Resp: 14 18  18   Height:      Weight: 69.4 kg (153 lb)  69.4 kg (153 lb)   SpO2:  100%  100%   Weight change: 3.46 kg (7 lb 10.1 oz)  Physical Exam: General: NAD , Alert OX3 Neck: Supple with bilateral soft swelling neck/supraclavicular area Lungs: CTA  Bilat.  Breathing is unlabored. Heart: RRR  1/6 sem lsb , no rub or gallop.  Abdomen: obese  Soft, BS = pos  And non-tender, non-distended. Lower extremities: tr RLE edema. Dialysis Access: R And L Thigh  AVGG - using right; left placed in August 19th in anticipation of needing in the future - both pos bruit   Op Dialysis Orders: [facility]: [facility] 3.75 hr 400/600 2 K 2.5 Ca/ Bilat. FEM AVGG - using R; left placed in August 19th in anticipation of needing in the future - Hectorol 1 venofer 100 x 2 then 50 per week, Mircera 225 - to be given today - last got 200 10/6  Problem/Plan: 1. Neck pain / swelling/  lymphadenopathy -  Admit team ww in process/ -Noted she does report PCP in [place] , [place]( related to [facility]) . 2. ESRD - TTS  HD  on schedule-yest HD with am K 3.8 have been lowering EDW - unintentional weight loss  3. Hypertension/volume - titrating  edw down on Amlodipine 10mg  hs/ MTP 50mg  q day 4. Anemia - Hgb 8.9>8.4on max ESA - 5. Metabolic bone disease - Continue Hectorol 1, binders 6. Nutrition - this am npo pending plans -has unintentional weight loss as per #2 7. Chronic coumadin- hx left CFV DVT 12/2014- neph/ [facility] is managing coumadin 8. DM - per primary  Ernest Haber, PA-C [place] [phone] 04/01/2015,9:21 AM  LOS: 1 day   Pt seen, examined and agree w A/P as above.  Kelly Splinter MD [facility] pager [phone]    cell [phone] 04/01/2015, 1:24 PM    Labs: Basic Metabolic Panel:  Recent Labs Lab 03/31/15 1555 04/01/15 0043 04/01/15 0419  NA 119* 134* 135  K 6.1* 3.7 3.8  CL 84* 97* 97*  CO2 23 23 23   GLUCOSE 1028* 138* 256*  BUN 77* 24* 28*  CREATININE 8.68* 4.21* 4.64*  CALCIUM 8.0* 8.7* 8.5*   Liver Function Tests:  Recent Labs Lab 03/30/15 1944 03/31/15 1555  AST 15 32  ALT 10* 15  ALKPHOS 145* 174*  BILITOT 0.7 0.8  PROT 7.7 7.7  ALBUMIN 2.8* 2.8*   No results for input(s): LIPASE, AMYLASE in the last 168 hours. No results for input(s): AMMONIA in the last 168 hours. CBC:  Recent Labs Lab 03/30/15 1944 04/01/15 0419  WBC 6.5 7.5  NEUTROABS 4.9  --   HGB 8.9* 8.4*  HCT 29.9* 27.3*  MCV 94.9 92.9  PLT 159 160   Cardiac Enzymes: No results for input(s): CKTOTAL, CKMB, CKMBINDEX, TROPONINI in the last 168 hours. CBG:  Recent Labs Lab 03/31/15 2223 03/31/15 2345 04/01/15 0053 04/01/15 0202 04/01/15 0802  GLUCAP 192* 134* 164* 228* 330*   Medications: . sodium  chloride    . dextrose 5 % and 0.45% NaCl     . amLODipine  10 mg Oral QHS  . calcium acetate  667 mg Oral BID WC  . clonazePAM  1 mg Oral QHS  . [START ON 04/07/2015] darbepoetin (ARANESP) injection - DIALYSIS  200 mcg Intravenous Q Thu-HD  . [START ON 04/02/2015] doxercalciferol  1 mcg Intravenous Q T,Th,Sa-HD  . feeding supplement (NEPRO CARB STEADY)  237 mL Oral BID BM  . [START ON 04/02/2015] ferric gluconate (FERRLECIT/NULECIT) IV  125 mg Intravenous Q T,Th,Sa-HD  . gabapentin  100 mg Oral Daily  . guaiFENesin  600 mg Oral BID  . insulin aspart  0-9 Units Subcutaneous TID WC  . insulin aspart  3 Units Subcutaneous TID WC  . insulin glargine  7 Units Subcutaneous BID  . lidocaine  1 patch Transdermal Q24H  . metoCLOPramide  10 mg Oral TID AC & HS  . metoprolol succinate  50 mg Oral Daily  . montelukast  10 mg Oral QHS  .  multivitamin  1 tablet Oral Daily  . pantoprazole  40 mg Oral QHS  . simvastatin  20 mg Oral QHS

## 2015-04-01 NOTE — Progress Notes (Signed)
Nutrition Brief Note  Patient identified on the Malnutrition Screening Tool (MST) Report  Wt Readings from Last 15 Encounters:  04/01/15 153 lb (69.4 kg)  03/02/15 154 lb (69.854 kg)  02/09/15 151 lb 12.8 oz (68.856 kg)  01/19/15 158 lb (71.668 kg)  01/15/15 152 lb 12.5 oz (69.3 kg)  01/05/15 148 lb 5.9 oz (67.3 kg)  12/29/14 151 lb (68.493 kg)  12/04/14 154 lb 1.6 oz (69.9 kg)  11/16/14 156 lb 4.9 oz (70.9 kg)  08/05/14 150 lb 5.7 oz (68.2 kg)  05/19/14 152 lb 5.4 oz (69.1 kg)  04/21/14 153 lb (69.4 kg)  04/18/14 151 lb (68.493 kg)  01/26/14 154 lb 5.2 oz (70 kg)  01/22/14 149 lb 4 oz (67.7 kg)    Body mass index is 28.92 kg/(m^2). Patient meets criteria for overwieght based on current BMI. Weight has been stable.   Diet has just been advanced. Pt reports PTA usually consuming at least 3 meals a day. Pt currently has Nepro Shake ordered and pt would like to continue with them. Plans for discharge home today after CT abd/pelvis. Labs and medications reviewed.   No further nutrition interventions warranted at this time. If nutrition issues arise, please consult RD.   Corrin Parker, MS, RD, LDN Pager # [phone] After hours/ weekend pager # [phone]

## 2015-04-01 NOTE — Progress Notes (Signed)
Subjective: Patient was seen and examined at bedside today. She continues to complain of right sided neck pain and swelling. Denies having any fevers, chills, nausea, vomiting, chest pain, or SOB. No headaches, blurry vision, or focal weakness.    Objective: Vital signs in last 24 hours: Filed Vitals:   03/31/15 2339 04/01/15 0409 04/01/15 0700 04/01/15 1000  BP: 121/59  116/55 120/58  Pulse: 85  68 78  Temp: 98 F (36.7 C)  99 F (37.2 C) 98.2 F (36.8 C)  TempSrc: Oral  Oral Oral  Resp: 18  18 18   Height:      Weight:  153 lb (69.4 kg)    SpO2: 100%  100% 100%   Weight change: 7 lb 10.1 oz (3.46 kg)  Intake/Output Summary (Last 24 hours) at 04/01/15 1551 Last data filed at 04/01/15 1000  Gross per 24 hour  Intake    300 ml  Output   3037 ml  Net  -2737 ml   Physical Exam: Gen: AAOx3, NAD HEENT: bilateral enlarged supraclavicular LN, tender to palpation on right side CVS: RRR, S1 and S2 appreciated  Lungs: CTAB. No wheezes, rales, or rhochi.  Abd: soft, non tender, BS + Ext: no edema Skin: warm and dry    Lab Results: Basic Metabolic Panel:  Recent Labs Lab 04/01/15 0043 04/01/15 0419  NA 134* 135  K 3.7 3.8  CL 97* 97*  CO2 23 23  GLUCOSE 138* 256*  BUN 24* 28*  CREATININE 4.21* 4.64*  CALCIUM 8.7* 8.5*   Liver Function Tests:  Recent Labs Lab 03/30/15 1944 03/31/15 1555  AST 15 32  ALT 10* 15  ALKPHOS 145* 174*  BILITOT 0.7 0.8  PROT 7.7 7.7  ALBUMIN 2.8* 2.8*   CBC:  Recent Labs Lab 03/30/15 1944 04/01/15 0419  WBC 6.5 7.5  NEUTROABS 4.9  --   HGB 8.9* 8.4*  HCT 29.9* 27.3*  MCV 94.9 92.9  PLT 159 160   CBG:  Recent Labs Lab 03/31/15 2345 04/01/15 0053 04/01/15 0202 04/01/15 0802 04/01/15 1040 04/01/15 1140  GLUCAP 134* 164* 228* 330* 203* 164*   Hemoglobin A1C:  Recent Labs Lab 03/31/15 1912  HGBA1C 10.0*   Coagulation:  Recent Labs Lab 03/31/15 0746  LABPROT 19.5*  INR 1.64*    Micro Results: No  results found for this or any previous visit (from the past 240 hour(s)). Studies/Results: Ct Soft Tissue Neck W Contrast  03/31/2015  CLINICAL DATA:  LEFT neck swelling for 2 weeks, 2 days of RIGHT neck swelling. Pain. History of diabetes, end-stage renal disease on dialysis, status post kidney pancreatic transplant. EXAM: CT NECK WITH CONTRAST TECHNIQUE: Multidetector CT imaging of the neck was performed using the standard protocol following the bolus administration of intravenous contrast. CONTRAST:  39mL OMNIPAQUE IOHEXOL 300 MG/ML  SOLN COMPARISON:  None. FINDINGS: Pharynx and larynx: Prominent uvula partially effacing the airway, palatine tonsils are not enlarged or inflamed. Small retropharyngeal effusion without discrete abscess. Mild hypo pharyngeal edema. No abscess. Larynx is normal. Salivary glands: Normal. Thyroid: Normal. Lymph nodes: Lymphadenopathy throughout the neck, including round LEFT supraclavicular 13 mm short axis lymph node, 9 mm short axis RIGHT paratracheal lymph node. Rounded RIGHT level IIa 10 mm lymph node. Vascular: Moderate calcific atherosclerosis of the carotid bulbs, advanced for age. 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 Enlarged patent bilateral external jugular veins. Limited intracranial: Normal. Visualized orbits: Normal. Mastoids and visualized paranasal sinuses: Well aerated. 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 Multiple absent teeth. Upper chest: Pulmonary vascular congestion, suspected cardiomegaly. Partially imaged mediastinal lymphadenopathy. IMPRESSION: Mild pharyngitis. Prominent uvula partially effaces the airway. Small retropharyngeal effusion without abscess. Mild lymphadenopathy throughout the neck, partially imaged mediastinal lymphadenopathy for which dedicated CT of the chest with contrast is recommended. Partially imaged cardiomegaly and pulmonary  vascular congestion. Chronically occluded bilateral internal jugular veins, enlarged patent external jugular veins. Electronically Signed   By: Elon Alas M.D.   On: 03/31/2015 03:20   Ct Chest W Contrast  03/31/2015  CLINICAL DATA:  RIGHT side neck pain,swelling x 2 days; pt had CT Neck done, which was suggestive of lymphadenopathy; pt has dry cough,denies any chest painESRD pt, to be dialyzed later today EXAM: CT CHEST WITH CONTRAST TECHNIQUE: Multidetector CT imaging of the chest was performed during intravenous contrast administration. CONTRAST:  17mL OMNIPAQUE IOHEXOL 300 MG/ML  SOLN COMPARISON:  None. FINDINGS: The central airways are patent. There is no focal consolidation, pleural effusion or pneumothorax. There is a 6 mm subpleural left lower lobe pulmonary nodule (image 37/ series 3). There is mild bilateral axillary lymphadenopathy. The largest left axillary lymph node measures 12 mm. The largest right axillary lymph node measures 12.6 mm. There is mediastinal lymphadenopathy with the largest right upper paratracheal lymph node measuring 12.5 mm. There is an enlarged subcarinal lymph node measuring 2 cm. There is supraclavicular lymphadenopathy with the largest right supraclavicular lymph node measuring 12 mm. The largest left infraclavicular lymph node measures 13.8 mm. There is a spiculated masslike area in the medial aspect of the right pectoralis major muscle with ill-defined margins and haziness in the surrounding fat with the overall area measuring 5.3 x 2.3 cm. The heart size is normal. There is coronary artery atherosclerosis in the LAD. There are numerous left chest wall collaterals vessels. There is no pericardial effusion. The thoracic aorta is normal in caliber. Review of bone windows demonstrates no focal lytic or sclerotic lesions. Limited non-contrast images of the upper abdomen were obtained. The adrenal glands appear normal. The remainder of the visualized abdominal organs are  unremarkable. IMPRESSION: 1. Nonspecific bilateral supraclavicular/infraclavicular, axillary and mediastinal lymphadenopathy. Spiculated masslike area in the medial aspect of the right pectoralis major muscle with ill-defined margins. The appearance is most concerning for malignancy until proven otherwise. Malignancy such as lymphoma or metastatic disease are the 2 main considerations. Tissue diagnosis recommended. 2. Nonspecific 6 mm subpleural left lower lobe pulmonary nodule. If the patient is at high risk for bronchogenic carcinoma, follow-up chest CT at 6-12 months is recommended. If the patient is at low risk for bronchogenic carcinoma, follow-up chest CT at 12 months is recommended. This recommendation follows the consensus statement: Guidelines for Management of Small Pulmonary Nodules Detected on CT Scans: A Statement from the [place] as published in Radiology 2005;237:395-400. Electronically Signed   By: Kathreen Devoid   On: 03/31/2015 08:47   Medications: I have reviewed the patient's current medications. Scheduled Meds: . amLODipine  10 mg Oral QHS  . calcium acetate  667 mg Oral BID WC  . clonazePAM  1 mg Oral QHS  . [START ON 04/07/2015] darbepoetin (ARANESP) injection - DIALYSIS  200 mcg Intravenous Q Thu-HD  . [START ON 04/02/2015] doxercalciferol  1 mcg Intravenous Q T,Th,Sa-HD  . enoxaparin (LOVENOX) injection  70 mg Subcutaneous Q24H  . feeding supplement (NEPRO CARB STEADY)  237 mL Oral BID BM  . [START ON 04/02/2015] ferric gluconate (FERRLECIT/NULECIT) IV  125 mg Intravenous Q T,Th,Sa-HD  . gabapentin  100 mg Oral Daily  . guaiFENesin  600 mg Oral BID  . insulin aspart  0-9 Units Subcutaneous TID WC  . insulin aspart  3 Units Subcutaneous TID WC  . insulin glargine  7 Units Subcutaneous BID  . lidocaine  1 patch Transdermal Q24H  . metoCLOPramide  10 mg Oral TID AC & HS  . metoprolol succinate  50 mg Oral Daily  . montelukast  10 mg Oral QHS  . multivitamin  1 tablet  Oral Daily  . pantoprazole  40 mg Oral QHS  . simvastatin  20 mg Oral QHS  . warfarin  7.5 mg Oral Once  . 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 I spoke to interventional radiologist Dr. Pascal Lux and he recommended that the patient get a CT of abdomen/ pevis and then follow up with them as an outpatient for a biopsy. Patient is stable and will be discharged after her CT. IR has been consulted (ambulatory referral put in) and their office will call the patient to schedule an outpatient biopsy.  -F/u CT of abdomen and pelvis.  -SPEP pending -Oxycodone 5 mg q8 prn pain -F/u with IR for biopsy -F/u with PCP  Severe hyperglycemia: Likely due to missing 2 insulin doses yesterday. Her glucose was >1000 but patient continued to have a normal anion gap. She was kept on an insulin drip until blood sugar <200. CBG 164 now. K normal.  -Insulin drip stopped  -Lantus 7u BID -Novolog 3u TID -SSI  H/o LLE DVT (12/2014): Since IR is recommending outpatient biopsy, patient has been started on Coumadin with a Lovenox bridge.   HFpEF: Stable. TTE in 03/2014 with LVEF 60% and mild LVH. AV sclerosis w/o stenosis.  -Monitor   ESRD on HD (TTS): New left AVG in 12/2014. Patient went for dialysis yesterday. K 3.8 today.  -INR check on 11/5 and 11/8 with HD   HTN: Amlodipine 10 mg qd, Metoprolol 50 mg qd, and Simvastatin 20 mg qd.   GERD:  Protonix 40 mg qd   DVT ppx: Coumadin with Lovenox bridge   Code: Full  Dispo: Disposition is deferred at this time, awaiting improvement of current medical problems.  Anticipated discharge in approximately 0 day(s).   The patient does have a current PCP [name], MD) and does need an [facility] hospital follow-up appointment after discharge.  The patient does not have transportation limitations that hinder transportation to clinic appointments.  .Services Needed at time of discharge: Y = Yes, Blank = No PT:   OT:   RN:   Equipment:   Other:     LOS: 1 day   Shela Leff, MD 04/01/2015, 3:51 PM

## 2015-04-01 NOTE — Progress Notes (Signed)
Patient seen and examined. Case d/w residents in detail. I agree with findings and plan as documented in Dr. Elon Jester note.  Patient still with pain over enlarged R supraclavicular LN. Scheduled for CT abd/pelvis today. Will need to get CT prior to d/c. Residents discussed case with IR - will need to f/u as outpatient for biopsy. C/w pain control.   Patient also with severe hyperglycemia yesterday. Insulin gtt dc'd overnight. C/w home dose of insulin.  Patient stable for d/c home after CT abd/pelvis. Will need outpatient f/u with onc and PCP

## 2015-04-01 NOTE — Clinical Documentation Improvement (Addendum)
Internal Medicine  Can the diagnosis of "anemia  unspecified" be further specified? Thank you    Iron deficiency Anemia  Nutritional anemia, including the nutrition or mineral deficits  Chronic Anemia, including the suspected or known cause  Anemia of chronic disease, including the associated chronic disease state  Other  Clinically Undetermined  Document any associated diagnoses/conditions.     Please exercise your independent, professional judgment when responding. A specific answer is not anticipated or expected.   Thank You,  [place] [phone]

## 2015-04-02 ENCOUNTER — Inpatient Hospital Stay (HOSPITAL_COMMUNITY): Payer: Medicare Other

## 2015-04-02 LAB — DIFFERENTIAL
Basophils Absolute: 0 10*3/uL (ref 0.0–0.1)
Basophils Relative: 0 %
Eosinophils Absolute: 0 10*3/uL (ref 0.0–0.7)
Eosinophils Relative: 0 %
LYMPHS ABS: 1.5 10*3/uL (ref 0.7–4.0)
LYMPHS PCT: 19 %
Monocytes Absolute: 0.5 10*3/uL (ref 0.1–1.0)
Monocytes Relative: 6 %
NEUTROS ABS: 5.8 10*3/uL (ref 1.7–7.7)
NEUTROS PCT: 75 %

## 2015-04-02 LAB — BASIC METABOLIC PANEL
ANION GAP: 12 (ref 5–15)
BUN: 47 mg/dL — ABNORMAL HIGH (ref 6–20)
CHLORIDE: 94 mmol/L — AB (ref 101–111)
CO2: 22 mmol/L (ref 22–32)
Calcium: 8.2 mg/dL — ABNORMAL LOW (ref 8.9–10.3)
Creatinine, Ser: 7.31 mg/dL — ABNORMAL HIGH (ref 0.44–1.00)
GFR calc Af Amer: 7 mL/min — ABNORMAL LOW (ref >60)
GFR, EST NON AFRICAN AMERICAN: 6 mL/min — AB (ref >60)
GLUCOSE: 440 mg/dL — AB (ref 65–99)
POTASSIUM: 4.5 mmol/L (ref 3.5–5.1)
SODIUM: 128 mmol/L — AB (ref 135–145)

## 2015-04-02 LAB — GLUCOSE, CAPILLARY
GLUCOSE-CAPILLARY: 191 mg/dL — AB (ref 65–99)
Glucose-Capillary: 239 mg/dL — ABNORMAL HIGH (ref 65–99)
Glucose-Capillary: 104 mg/dL — ABNORMAL HIGH (ref 65–99)
Glucose-Capillary: 79 mg/dL (ref 65–99)

## 2015-04-02 LAB — CBC
HCT: 28.1 % — ABNORMAL LOW (ref 36.0–46.0)
Hemoglobin: 8.6 g/dL — ABNORMAL LOW (ref 12.0–15.0)
MCH: 28.6 pg (ref 26.0–34.0)
MCHC: 30.6 g/dL (ref 30.0–36.0)
MCV: 93.4 fL (ref 78.0–100.0)
PLATELETS: 167 10*3/uL (ref 150–400)
RBC: 3.01 MIL/uL — ABNORMAL LOW (ref 3.87–5.11)
RDW: 16.7 % — ABNORMAL HIGH (ref 11.5–15.5)
WBC: 8 10*3/uL (ref 4.0–10.5)

## 2015-04-02 LAB — PROTIME-INR
INR: 1.85 — AB (ref 0.00–1.49)
Prothrombin Time: 21.3 seconds — ABNORMAL HIGH (ref 11.6–15.2)

## 2015-04-02 MED ORDER — VANCOMYCIN HCL 10 G IV SOLR
1500.0000 mg | Freq: Once | INTRAVENOUS | Status: AC
  Administered 2015-04-02: 1500 mg via INTRAVENOUS
  Filled 2015-04-02: qty 1500

## 2015-04-02 MED ORDER — DOXERCALCIFEROL 4 MCG/2ML IV SOLN
INTRAVENOUS | Status: AC
  Filled 2015-04-02: qty 2

## 2015-04-02 MED ORDER — WARFARIN SODIUM 7.5 MG PO TABS
7.5000 mg | ORAL_TABLET | Freq: Once | ORAL | Status: DC
  Filled 2015-04-02: qty 1

## 2015-04-02 MED ORDER — VANCOMYCIN HCL IN DEXTROSE 750-5 MG/150ML-% IV SOLN
750.0000 mg | INTRAVENOUS | Status: DC
  Administered 2015-04-05 – 2015-04-12 (×4): 750 mg via INTRAVENOUS
  Filled 2015-04-02 (×7): qty 150

## 2015-04-02 MED ORDER — ENOXAPARIN SODIUM 80 MG/0.8ML ~~LOC~~ SOLN
1.0000 mg/kg | SUBCUTANEOUS | Status: DC
  Administered 2015-04-02: 70 mg via SUBCUTANEOUS

## 2015-04-02 MED ORDER — ENOXAPARIN SODIUM 80 MG/0.8ML ~~LOC~~ SOLN
1.0000 mg/kg | SUBCUTANEOUS | Status: DC
  Filled 2015-04-02: qty 0.8

## 2015-04-02 MED ORDER — OXYCODONE HCL 5 MG PO TABS
ORAL_TABLET | ORAL | Status: AC
  Filled 2015-04-02: qty 1

## 2015-04-02 NOTE — Progress Notes (Signed)
Subjective: Patient was planned to be discharged yesterday however she became febrile with T 102.9 yesterday evening, bcx was drawn, now afebrile with just one dose of tylenol yesterday evening. She states that she is doing fine. Denies any chest pain, diarrhea, n/v, sob. Does not make any urine so can't tell if has dysuria. Denies any suprapubic tenderness.    Objective: Vital signs in last 24 hours: Filed Vitals:   04/02/15 0800 04/02/15 0830 04/02/15 0900 04/02/15 0930  BP: 136/77 128/78 144/71 147/82  Pulse: 99 105 104 103  Temp:      TempSrc:      Resp: 18 18 17 15   Height:      Weight:      SpO2:       Weight change: 2 lb 9.1 oz (1.166 kg)  Intake/Output Summary (Last 24 hours) at 04/02/15 1016 Last data filed at 04/02/15 0600  Gross per 24 hour  Intake    580 ml  Output      0 ml  Net    580 ml   Physical Exam: Gen: AAOx3, NAD HEENT: bilateral enlarged supraclavicular LN, tender to palpation on right side CVS: RRR, S1 and S2 appreciated  Lungs: CTAB. No wheezes, rales, or rhochi.  Abd: soft, some tenderness on lower mid  Ext: no edema Skin: warm and dry    Lab Results: Basic Metabolic Panel:  Recent Labs Lab 04/01/15 0419 04/02/15 0711  NA 135 128*  K 3.8 4.5  CL 97* 94*  CO2 23 22  GLUCOSE 256* 440*  BUN 28* 47*  CREATININE 4.64* 7.31*  CALCIUM 8.5* 8.2*   Liver Function Tests:  Recent Labs Lab 03/30/15 1944 03/31/15 1555  AST 15 32  ALT 10* 15  ALKPHOS 145* 174*  BILITOT 0.7 0.8  PROT 7.7 7.7  ALBUMIN 2.8* 2.8*   CBC:  Recent Labs Lab 03/30/15 1944 04/01/15 0419 04/02/15 0711 04/02/15 0839  WBC 6.5 7.5 8.0  --   NEUTROABS 4.9  --   --  5.8  HGB 8.9* 8.4* 8.6*  --   HCT 29.9* 27.3* 28.1*  --   MCV 94.9 92.9 93.4  --   PLT 159 160 167  --    CBG:  Recent Labs Lab 04/01/15 0802 04/01/15 1040 04/01/15 1140 04/01/15 1745 04/01/15 2132 04/02/15 0923  GLUCAP 330* 203* 164* 76 256* 191*   Hemoglobin A1C:  Recent  Labs Lab 03/31/15 1912  HGBA1C 10.0*   Coagulation:  Recent Labs Lab 03/31/15 0746 04/02/15 0837  LABPROT 19.5* 21.3*  INR 1.64* 1.85*    Micro Results: Recent Results (from the past 240 hour(s))  Culture, blood (routine x 2)     Status: None (Preliminary result)   Collection Time: 04/01/15  7:50 PM  Result Value Ref Range Status   Specimen Description BLOOD RIGHT ARM  Final   Special Requests IN PEDIATRIC BOTTLE 1CC  Final   Culture NO GROWTH < 12 HOURS  Final   Report Status PENDING  Incomplete  Culture, blood (routine x 2)     Status: None (Preliminary result)   Collection Time: 04/01/15  7:56 PM  Result Value Ref Range Status   Specimen Description BLOOD LEFT HAND  Final   Special Requests IN PEDIATRIC BOTTLE 2CC  Final   Culture NO GROWTH < 12 HOURS  Final   Report Status PENDING  Incomplete   Studies/Results: Ct Abdomen Pelvis W Contrast  04/01/2015  CLINICAL DATA:  Adenopathy on a recent chest  CT. The patient complains of right neck swelling with mild adenopathy in the neck on a recent neck CT. EXAM: CT ABDOMEN AND PELVIS WITH CONTRAST TECHNIQUE: Multidetector CT imaging of the abdomen and pelvis was performed using the standard protocol following bolus administration of intravenous contrast. CONTRAST:  130mL OMNIPAQUE IOHEXOL 300 MG/ML  SOLN COMPARISON:  Recent neck and chest CT examinations. Abdomen and pelvis CT dated 11/26/2013. FINDINGS: Small amount of free peritoneal fluid. The lateral segment of the left lobe and the caudate lobe of the liver are enlarged and the right lobe is somewhat small. Normal sized spleen. Oval, mass-like area in the left lower abdomen and upper pelvis, measuring 4.0 x 2.3 cm on image number 54 and 4.4 cm in length on sagittal image number 119. This has a peripheral medium density component containing calcifications and is lower in density centrally. This has been present on multiple previous examinations and is slightly smaller than on  09/14/2010. Right common and external iliac artery stent. Bilateral femoral artery bypass grafts. The grafts appear patent. Cholecystectomy clips. Poor distention of the stomach with diffuse wall thickening. No small bowel or colonic abnormalities. No evidence of appendicitis. Mildly prominent bilateral common iliac lymph nodes with mild progression on the right. The large node on the right measures 9 mm in short axis diameter on image number 68. This measured 8 mm in short axis diameter on 11/26/2013. Mildly prominent bilateral internal iliac lymph nodes are unchanged. Minimally prominent retroperitoneal nodes without abnormal enlargement. Small kidneys containing multiple cysts. Atheromatous arterial calcifications. Unremarkable uterus and ovaries. The pancreas and adrenal glands are also unremarkable. Minimal dependent atelectasis at both lung bases. Unremarkable bones. IMPRESSION: 1. Interval small amount of ascites in the abdomen and pelvis. 2. Probable early changes of cirrhosis of the liver. 3. Poor distention of the stomach with diffuse wall thickening. The wall thickening could be due to underdistention, gastritis or diffuse neoplastic involvement, with differential considerations including gastric carcinoma and lymphoma. 4. Borderline enlarged bilateral common iliac lymph nodes with mild progression, specially on the right. Otherwise, no adenopathy. 5. Bilateral renal atrophy and multiple cysts. 6. Stable to slightly decreased in size of a probable old left renal transplant. Electronically Signed   By: Claudie Revering M.D.   On: 04/01/2015 17:06   Medications: I have reviewed the patient's current medications. Scheduled Meds: . calcium acetate  667 mg Oral BID WC  . clonazePAM  1 mg Oral QHS  . [START ON 04/07/2015] darbepoetin (ARANESP) injection - DIALYSIS  200 mcg Intravenous Q Thu-HD  . doxercalciferol      . doxercalciferol  1 mcg Intravenous Q T,Th,Sa-HD  . enoxaparin (LOVENOX) injection  1  mg/kg Subcutaneous Q24H  . feeding supplement (NEPRO CARB STEADY)  237 mL Oral BID BM  . ferric gluconate (FERRLECIT/NULECIT) IV  125 mg Intravenous Q T,Th,Sa-HD  . gabapentin  100 mg Oral Daily  . guaiFENesin  600 mg Oral BID  . insulin aspart  0-9 Units Subcutaneous TID WC  . insulin aspart  3 Units Subcutaneous TID WC  . insulin glargine  7 Units Subcutaneous BID  . lidocaine  1 patch Transdermal Q24H  . metoCLOPramide  10 mg Oral TID AC & HS  . montelukast  10 mg Oral QHS  . multivitamin  1 tablet Oral Daily  . pantoprazole  40 mg Oral QHS  . simvastatin  20 mg Oral QHS  . 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 Also got CT abdomen which shows small ascites, some cirrhotic changes of liver, diffuse stomach wall thickening concerning for gastritis vs neoplastic involvement (gastric carcinoma vs lymphoma?). HIV and LDH neg.   We spoke to interventional radiologist Dr. Pascal Lux an outpatient biopsy. IR has been consulted (ambulatory referral put in) and their office will call the patient to schedule an outpatient biopsy.  -SPEP pending -Oxycodone 5 mg q8 prn pain -F/u with IR for biopsy -F/u with PCP  Fever 11/4 evening with T102.9. Remains afebrile now. Unclear source, could be hospital acquired pneumonia as she does have some cough, but more likely from her malignancy.  - f/up on bcx, obtain CXR  today. - hold abx for now.    Severe hyperglycemia with DM Type I - did not receive insulin over 24 hours before we admitted her. Glucose was as high as 1000, without acidosis or anion gap. She was on insulin drip, and has been transitioned to lantus 7 untis BID with SSI and meal time insulin - continue to monitor and adjust insulin as needed.   Pseudohyponatremia - monitor with insulin regmine.  H/o LLE DVT (12/2014): Since IR is recommending outpatient biopsy, patient has been started on Coumadin with a Lovenox bridge.  - stop lovenox when INR >2.   HFpEF: Stable. TTE in 03/2014 with LVEF 60% and mild LVH. AV sclerosis w/o stenosis.  -Monitor   ESRD on HD (TTS): New left AVG in 12/2014 - had HD today.   HTN: Amlodipine 10 mg qd, Metoprolol 50 mg qd, and Simvastatin 20 mg qd.   GERD: Protonix 40 mg qd   DVT ppx: Coumadin with Lovenox bridge   Code: Full  Dispo: Disposition is deferred at this time, awaiting improvement of current medical problems.  Anticipated discharge in approximately 0 day(s).   The patient does have a current PCP [name], MD) and does need an [facility] hospital follow-up appointment after discharge.  The patient does not have transportation limitations that hinder transportation to clinic appointments.  .Services Needed at time of discharge: Y = Yes, Blank = No PT:   OT:   RN:   Equipment:   Other:     LOS: 2 days   Dellia Nims, MD 04/02/2015, 10:16 AM

## 2015-04-02 NOTE — Progress Notes (Addendum)
ANTICOAGULATION CONSULT NOTE - Follow Up Consult  Pharmacy Consult for Lovenox and Coumadin Indication: hx DVT  Allergies  Allergen Reactions  . Depakote [Divalproex Sodium] Other (See Comments)    Pt gets "delirious"  . Morphine And Related Nausea And Vomiting and Other (See Comments)    "makes me delirious"  . Penicillins Anaphylaxis    Tolerated Zosyn Dec 2014  . Tramadol Nausea And Vomiting  . Imitrex [Sumatriptan] Other (See Comments)    seizures  . Vicodin [Hydrocodone-Acetaminophen] Itching and Rash    Patient Measurements: Height: 5\' 1"  (154.9 cm) Weight: 148 lb 2.4 oz (67.2 kg) IBW/kg (Calculated) : 47.8  Vital Signs: Temp: 98.2 F (36.8 C) (11/05 1040) Temp Source: Oral (11/05 1040) BP: 155/83 mmHg (11/05 1040) Pulse Rate: 104 (11/05 1040)  Labs:  Recent Labs  03/30/15 1944 03/31/15 0746  04/01/15 0043 04/01/15 0419 04/02/15 0711 04/02/15 0837  HGB 8.9*  --   --   --  8.4* 8.6*  --   HCT 29.9*  --   --   --  27.3* 28.1*  --   PLT 159  --   --   --  160 167  --   LABPROT  --  19.5*  --   --   --   --  21.3*  INR  --  1.64*  --   --   --   --  1.85*  CREATININE 6.86*  --   < > 4.21* 4.64* 7.31*  --   < > = values in this interval not displayed.  Estimated Creatinine Clearance: 8.8 mL/min (by C-G formula based on Cr of 7.31).  Assessment: 44 yo F on coumadin pta for hx DVT. Coumadin held on admission (last dose taken 11/2 at home) for possible neck biopsy, however, biopsy now cancelled and coumadin to resume. INR remains subtherapeutic at 1.85 but trending up.  Initial plan was to discharge home on Lovenox bridging, however pt became febrile yesterday with Tm 102.9.  ESRD on HD TTS, weight = 67.2kg.  Home dose: 7.5mg  daily except 5mg  TTSat  Goal of Therapy:  INR 2-3 Monitor platelets by anticoagulation protocol: Yes   Plan:  1) Continue Lovenox 70mg  sq q24 (1mg /kg/q24) 2) Coumadin 7.5mg  x 1 3) Daily INR 4) If patient is discharged over the  weekend, would recommend continuing Lovenox for both Saturday and Sunday.  Suspect INR will be >2 by Monday.  [employer], Pharm.D., BCPS Clinical Pharmacist Pager [phone] 04/02/2015 11:39 AM     Addendum:  To add Vancomycin per pharmacy.  Pt spiked Tm 102.9 yesterday and continues to have low grade temp 100 today.  Blood cx and CXR ordered.  WBC wnl.  Also, Coumadin discontinued by MD.  No notes at this time as to reason.  Possible changes in plans regarding biopsy?  Plan:   Vancomycin 1500mg  IV x 1 now (pt has already completed HD for today. Vancomycin 750mg  IV qHD - TTS  [employer], Pharm.D., BCPS Clinical Pharmacist Pager [phone] 04/02/2015 2:11 PM

## 2015-04-02 NOTE — Progress Notes (Signed)
Subjective:   Hospital day 2  Plan was for patient to be discharged yesterday after CT scan for outpatient biopsy next week, however she was noted to have a fever of 102.38F and O2 sats down to 92% on room air that evening and was kept for infectious workup.  Patient was receiving dialysis at time of interview.  States she does not have chest pain but has a cough.  Not complaining of any new pain right now.  Does not make urine at baseline.    Objective: Vital signs in last 24 hours: Filed Vitals:   04/02/15 0800 04/02/15 0830 04/02/15 0900 04/02/15 0930  BP: 136/77 128/78 144/71 147/82  Pulse: 99 105 104 103  Temp:      TempSrc:      Resp: 18 18 17 15   Height:      Weight:      SpO2:       Weight change: 1.166 kg (2 lb 9.1 oz)  Intake/Output Summary (Last 24 hours) at 04/02/15 1007 Last data filed at 04/02/15 0600  Gross per 24 hour  Intake    580 ml  Output      0 ml  Net    580 ml   General: African-American female, appears older than age (44), laying in bed receiving dialysis.  Initially asleep but roused to voice.  Had some difficulty with sitting up and used medical student's arm to maintain upright position. 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 Neck: Deferred Cardiovascular: Deferred to upper level resident Pulmonary: normal work of breathing, Deferred to upper level resident Psych: Alert and oriented to person  Lab Results: CMP Latest Ref Rng 04/02/2015 04/01/2015 04/01/2015  Glucose 65 - 99 mg/dL 440(H) 256(H) 138(H)  BUN 6 - 20 mg/dL 47(H) 28(H) 24(H)  Creatinine 0.44 - 1.00 mg/dL 7.31(H) 4.64(H) 4.21(H)  Sodium 135 - 145 mmol/L 128(L) 135 134(L)  Potassium 3.5 - 5.1 mmol/L 4.5 3.8 3.7  Chloride 101 - 111 mmol/L 94(L) 97(L) 97(L)  CO2 22 - 32 mmol/L 22 23 23   Calcium 8.9 - 10.3 mg/dL 8.2(L) 8.5(L) 8.7(L)  Total Protein 6.5 - 8.1 g/dL - - -  Total Bilirubin 0.3 - 1.2 mg/dL - - -  Alkaline Phos 38 - 126 U/L - - -  AST  15 - 41 U/L - - -  ALT 14 - 54 U/L - - -   CBC Latest Ref Rng 04/02/2015 04/01/2015 03/30/2015  WBC 4.0 - 10.5 K/uL 8.0 7.5 6.5  Hemoglobin 12.0 - 15.0 g/dL 8.6(L) 8.4(L) 8.9(L)  Hematocrit 36.0 - 46.0 % 28.1(L) 27.3(L) 29.9(L)  Platelets 150 - 400 K/uL 167 160 159   Urinalysis    Component Value Date/Time   COLORURINE YELLOW 09/07/2010 1517   APPEARANCEUR TURBID* 09/07/2010 1517   LABSPEC 1.016 09/07/2010 1517   PHURINE 7.0 09/07/2010 1517   GLUCOSEU >1000* 09/07/2010 1517   HGBUR LARGE* 09/07/2010 1517   BILIRUBINUR NEGATIVE 09/07/2010 1517   KETONESUR NEGATIVE 09/07/2010 1517   PROTEINUR >300* 09/07/2010 1517   UROBILINOGEN 0.2 09/07/2010 1517   NITRITE NEGATIVE 09/07/2010 1517   LEUKOCYTESUR LARGE* 09/07/2010 1517    Micro Results: Recent Results (from the past 240 hour(s))  Culture, blood (routine x 2)     Status: None (Preliminary result)   Collection Time: 04/01/15  7:50 PM  Result Value Ref Range Status   Specimen Description BLOOD RIGHT ARM  Final   Special Requests IN PEDIATRIC BOTTLE [place]  Final  Culture NO GROWTH < 12 HOURS  Final   Report Status PENDING  Incomplete  Culture, blood (routine x 2)     Status: None (Preliminary result)   Collection Time: 04/01/15  7:56 PM  Result Value Ref Range Status   Specimen Description BLOOD LEFT HAND  Final   Special Requests IN PEDIATRIC BOTTLE 2CC  Final   Culture NO GROWTH < 12 HOURS  Final   Report Status PENDING  Incomplete   Studies/Results: Ct Abdomen Pelvis W Contrast  04/01/2015  CLINICAL DATA:  Adenopathy on a recent chest CT. The patient complains of right neck swelling with mild adenopathy in the neck on a recent neck CT. EXAM: CT ABDOMEN AND PELVIS WITH CONTRAST TECHNIQUE: Multidetector CT imaging of the abdomen and pelvis was performed using the standard protocol following bolus administration of intravenous contrast. CONTRAST:  119mL OMNIPAQUE IOHEXOL 300 MG/ML  SOLN COMPARISON:  Recent neck and chest CT  examinations. Abdomen and pelvis CT dated 11/26/2013. FINDINGS: Small amount of free peritoneal fluid. The lateral segment of the left lobe and the caudate lobe of the liver are enlarged and the right lobe is somewhat small. Normal sized spleen. Oval, mass-like area in the left lower abdomen and upper pelvis, measuring 4.0 x 2.3 cm on image number 54 and 4.4 cm in length on sagittal image number 119. This has a peripheral medium density component containing calcifications and is lower in density centrally. This has been present on multiple previous examinations and is slightly smaller than on 09/14/2010. Right common and external iliac artery stent. Bilateral femoral artery bypass grafts. The grafts appear patent. Cholecystectomy clips. Poor distention of the stomach with diffuse wall thickening. No small bowel or colonic abnormalities. No evidence of appendicitis. Mildly prominent bilateral common iliac lymph nodes with mild progression on the right. The large node on the right measures 9 mm in short axis diameter on image number 68. This measured 8 mm in short axis diameter on 11/26/2013. Mildly prominent bilateral internal iliac lymph nodes are unchanged. Minimally prominent retroperitoneal nodes without abnormal enlargement. Small kidneys containing multiple cysts. Atheromatous arterial calcifications. Unremarkable uterus and ovaries. The pancreas and adrenal glands are also unremarkable. Minimal dependent atelectasis at both lung bases. Unremarkable bones. IMPRESSION: 1. Interval small amount of ascites in the abdomen and pelvis. 2. Probable early changes of cirrhosis of the liver. 3. Poor distention of the stomach with diffuse wall thickening. The wall thickening could be due to underdistention, gastritis or diffuse neoplastic involvement, with differential considerations including gastric carcinoma and lymphoma. 4. Borderline enlarged bilateral common iliac lymph nodes with mild progression, specially on the  right. Otherwise, no adenopathy. 5. Bilateral renal atrophy and multiple cysts. 6. Stable to slightly decreased in size of a probable old left renal transplant. Electronically Signed   By: Claudie Revering M.D.   On: 04/01/2015 17:06   Medications: I have reviewed the patient's current medications. Scheduled Meds: . calcium acetate  667 mg Oral BID WC  . clonazePAM  1 mg Oral QHS  . [START ON 04/07/2015] darbepoetin (ARANESP) injection - DIALYSIS  200 mcg Intravenous Q Thu-HD  . doxercalciferol      . doxercalciferol  1 mcg Intravenous Q T,Th,Sa-HD  . enoxaparin (LOVENOX) injection  1 mg/kg Subcutaneous Q24H  . feeding supplement (NEPRO CARB STEADY)  237 mL Oral BID BM  . ferric gluconate (FERRLECIT/NULECIT) IV  125 mg Intravenous Q T,Th,Sa-HD  . gabapentin  100 mg Oral Daily  . guaiFENesin  600 mg  Oral BID  . insulin aspart  0-9 Units Subcutaneous TID WC  . insulin aspart  3 Units Subcutaneous TID WC  . insulin glargine  7 Units Subcutaneous BID  . lidocaine  1 patch Transdermal Q24H  . metoCLOPramide  10 mg Oral TID AC & HS  . montelukast  10 mg Oral QHS  . multivitamin  1 tablet Oral Daily  . pantoprazole  40 mg Oral QHS  . simvastatin  20 mg Oral QHS  . 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  Of note, patient had very high blood glucose on admission (>1000) but no lab evidence of anion gap or acidosis, and blood glucose improved with administration of insulin. Plan was to discharge 11/4 after CT abdomen, but patient was kept overnight after she was noted to have a new fever yesterday afternoon.   #Lymphadenopathy suspected 2/2 lymphoma vs mets: bilateral supraclavicular node lymphadenopathy and mediastinal lymphadenopathy with spiculated mass in right pectoralis major  -Biopsy deferred until CT abd/pelvis with contrast -CT abd/pelvis obtained evening of 11/4 showed stomach wall thickening suggestive of possible diffuse neoplastic process -Consider Outpatient vs. Inpatient lymph node biopsy on Monday pending infectious workup for new fever  #SOB: evidence of pulmonary congestion on chest imaging, unclear underlying etiology, r/o infection given new fever evening of 11/4.  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 -Continue Protonix 40mg  daily at bedtime -Consider EGD with biopsy given CT findings of possible neoplastic process involving GI tract  #T1DM: >1000 blood glucose on admission, improved to <200 after administration of 20 units novolog, 7 units Lantus and insulin drip -Drip discontinued overnight 11/3 -Continue Lantus 7 Units BID per home dosing regimen -Continue 3 units insulin aspart with meals   #ESRD on T/Th/Sat HD: -patient received hemodialysis 11/3, hyperkalemia  improved from 6.1 --> 3.7; 4.5 on 11/5 -calcium acetate 667mg  BID with meals -doxercalciferol 17mcg with HD  #Anemia of chronic disease -223mcg Darbopoetin Alfa every Thursday during HD to stimulate bone marrow   #Prior DVT -Currently holding anticoagulation with lovenox in anticipation of biopsy -Will need to resume home warfarin after biopsy  #Pain: received 5 doses of IV fentanyl in ED -Consider tramadol if pain remains uncontrolled on Tylenol 650mg  q6 PRN  #Sleep: pt on 1mg  clonzaepam at home daily at bedtime -Continue home dose to avoid benzo withdrawal   #HTN -continue 50mg  metoprolol, 10mg  amlodipine daily  #  HLD -continue simvastatin 20mg   #Dispo: deferred at this time pending work up for possible infection  This is a Careers information officer Note.  The care of the patient was discussed with Dr. Berline Lopes and the assessment and plan formulated with their assistance.  Please see their attached note for official documentation of the daily encounter.   LOS: 2 days   Ladell Pier, Med Student 04/02/2015, 10:07 AM

## 2015-04-02 NOTE — Progress Notes (Signed)
Utilization Review Completed.[name] T11/09/2014  

## 2015-04-02 NOTE — Progress Notes (Signed)
Subjective:  Co of neck discomfort/ HD yest  On schedule / mother in room   Objective Vital signs in last 24 hours: Filed Vitals:   04/02/15 0930 04/02/15 1000 04/02/15 1030 04/02/15 1040  BP: 147/82 150/75 158/81 155/83  Pulse: 103 102 105 104  Temp:    98.2 F (36.8 C)  TempSrc:    Oral  Resp: 15 16 16 15   Height:      Weight:      SpO2:       Weight change: 1.166 kg (2 lb 9.1 oz)  Physical Exam: General: NAD , Alert OX3 Neck: Supple with bilateral soft swelling neck/supraclavicular area Lungs: CTA  Bilat.  Breathing is unlabored. Heart: RRR  1/6 sem lsb , no rub or gallop. R upper chest and R ant neck very tender, no erythema or fluctuance  Abdomen: obese  Soft, BS = pos  And non-tender, non-distended. Lower extremities: tr RLE edema. Dialysis Access: R And L Thigh  AVGG - using right; left placed in August 19th in anticipation of needing in the future - both pos bruit   NW TTS 3.75 hr 66.5kg 400/600 2 K 2.5 Ca/ Bilat. FEM AVGG - using R; left placed in August 19th in anticipation of needing in the future - Hectorol 1 venofer 100 x 2 then 50 per week, Mircera 225 - to be given today - last got 200 10/6  Assessment: 1. R chest/ neck pain/ pectoralis major soft tissue mass/ [place] and mediast LAN- needs biopsy, likely has cancer. May have infiltrative gastric process by abd CT also 2. Fever - tmax 102 last night, per primary. BCx's done, CXR pending 3. ESRD TTS HD 4. HTN  on Amlodipine 10mg  hs/ MTP 50mg  q day 5. Anemia - Hgb 8.9>8.4on max ESA - 6. Metabolic bone disease - Continue Hectorol 1, binders 7. Nutrition - this am npo pending plans -has unintentional weight loss as per #2 8. Chronic coumadin- hx left CFV DVT 12/2014- neph/ [facility] is managing coumadin 9. DM - per primary  Plan - HD today, UF 2-3 kg   Kelly Splinter MD [facility] pager [phone]    cell [phone] 04/02/2015, 10:51 AM    Labs: Basic Metabolic Panel:  Recent Labs Lab  04/01/15 0043 04/01/15 0419 04/02/15 0711  NA 134* 135 128*  K 3.7 3.8 4.5  CL 97* 97* 94*  CO2 23 23 22   GLUCOSE 138* 256* 440*  BUN 24* 28* 47*  CREATININE 4.21* 4.64* 7.31*  CALCIUM 8.7* 8.5* 8.2*   Liver Function Tests:  Recent Labs Lab 03/30/15 1944 03/31/15 1555  AST 15 32  ALT 10* 15  ALKPHOS 145* 174*  BILITOT 0.7 0.8  PROT 7.7 7.7  ALBUMIN 2.8* 2.8*   No results for input(s): LIPASE, AMYLASE in the last 168 hours. No results for input(s): AMMONIA in the last 168 hours. CBC:  Recent Labs Lab 03/30/15 1944 04/01/15 0419 04/02/15 0711 04/02/15 0839  WBC 6.5 7.5 8.0  --   NEUTROABS 4.9  --   --  5.8  HGB 8.9* 8.4* 8.6*  --   HCT 29.9* 27.3* 28.1*  --   MCV 94.9 92.9 93.4  --   PLT 159 160 167  --    Cardiac Enzymes: No results for input(s): CKTOTAL, CKMB, CKMBINDEX, TROPONINI in the last 168 hours. CBG:  Recent Labs Lab 04/01/15 1040 04/01/15 1140 04/01/15 1745 04/01/15 2132 04/02/15 0923  GLUCAP 203* 164* 76 256* 191*   Medications:   .  calcium acetate  667 mg Oral BID WC  . clonazePAM  1 mg Oral QHS  . [START ON 04/07/2015] darbepoetin (ARANESP) injection - DIALYSIS  200 mcg Intravenous Q Thu-HD  . doxercalciferol  1 mcg Intravenous Q T,Th,Sa-HD  . enoxaparin (LOVENOX) injection  1 mg/kg Subcutaneous Q24H  . feeding supplement (NEPRO CARB STEADY)  237 mL Oral BID BM  . ferric gluconate (FERRLECIT/NULECIT) IV  125 mg Intravenous Q T,Th,Sa-HD  . gabapentin  100 mg Oral Daily  . guaiFENesin  600 mg Oral BID  . insulin aspart  0-9 Units Subcutaneous TID WC  . insulin aspart  3 Units Subcutaneous TID WC  . insulin glargine  7 Units Subcutaneous BID  . lidocaine  1 patch Transdermal Q24H  . metoCLOPramide  10 mg Oral TID AC & HS  . montelukast  10 mg Oral QHS  . multivitamin  1 tablet Oral Daily  . pantoprazole  40 mg Oral QHS  . simvastatin  20 mg Oral QHS  . Warfarin - Pharmacist Dosing Inpatient   Does not apply [phone]

## 2015-04-03 ENCOUNTER — Encounter (HOSPITAL_COMMUNITY): Payer: Self-pay | Admitting: Radiology

## 2015-04-03 LAB — RENAL FUNCTION PANEL
ALBUMIN: 2.8 g/dL — AB (ref 3.5–5.0)
ANION GAP: 14 (ref 5–15)
BUN: 22 mg/dL — ABNORMAL HIGH (ref 6–20)
CHLORIDE: 94 mmol/L — AB (ref 101–111)
CO2: 24 mmol/L (ref 22–32)
Calcium: 8.8 mg/dL — ABNORMAL LOW (ref 8.9–10.3)
Creatinine, Ser: 4.94 mg/dL — ABNORMAL HIGH (ref 0.44–1.00)
GFR calc Af Amer: 12 mL/min — ABNORMAL LOW (ref >60)
GFR calc non Af Amer: 10 mL/min — ABNORMAL LOW (ref >60)
GLUCOSE: 50 mg/dL — AB (ref 65–99)
PHOSPHORUS: 2.5 mg/dL (ref 2.5–4.6)
POTASSIUM: 4.9 mmol/L (ref 3.5–5.1)
Sodium: 132 mmol/L — ABNORMAL LOW (ref 135–145)

## 2015-04-03 LAB — CBC WITH DIFFERENTIAL/PLATELET
BASOS ABS: 0 10*3/uL (ref 0.0–0.1)
BASOS PCT: 0 %
EOS PCT: 1 %
Eosinophils Absolute: 0.1 10*3/uL (ref 0.0–0.7)
HEMATOCRIT: 32.8 % — AB (ref 36.0–46.0)
HEMOGLOBIN: 10.3 g/dL — AB (ref 12.0–15.0)
LYMPHS PCT: 22 %
Lymphs Abs: 2.1 10*3/uL (ref 0.7–4.0)
MCH: 29.6 pg (ref 26.0–34.0)
MCHC: 31.4 g/dL (ref 30.0–36.0)
MCV: 94.3 fL (ref 78.0–100.0)
MONOS PCT: 9 %
Monocytes Absolute: 0.9 10*3/uL (ref 0.1–1.0)
NEUTROS ABS: 6.4 10*3/uL (ref 1.7–7.7)
Neutrophils Relative %: 68 %
Platelets: 203 10*3/uL (ref 150–400)
RBC: 3.48 MIL/uL — ABNORMAL LOW (ref 3.87–5.11)
RDW: 17.1 % — ABNORMAL HIGH (ref 11.5–15.5)
WBC: 9.5 10*3/uL (ref 4.0–10.5)

## 2015-04-03 LAB — GLUCOSE, CAPILLARY
GLUCOSE-CAPILLARY: 59 mg/dL — AB (ref 65–99)
GLUCOSE-CAPILLARY: 113 mg/dL — AB (ref 65–99)
GLUCOSE-CAPILLARY: 88 mg/dL (ref 65–99)
GLUCOSE-CAPILLARY: 123 mg/dL — AB (ref 65–99)
GLUCOSE-CAPILLARY: 159 mg/dL — AB (ref 65–99)
Glucose-Capillary: 80 mg/dL (ref 65–99)
Glucose-Capillary: 160 mg/dL — ABNORMAL HIGH (ref 65–99)

## 2015-04-03 LAB — PROTIME-INR
INR: 1.91 — ABNORMAL HIGH (ref 0.00–1.49)
Prothrombin Time: 21.8 seconds — ABNORMAL HIGH (ref 11.6–15.2)

## 2015-04-03 MED ORDER — OXYCODONE HCL 5 MG PO TABS
5.0000 mg | ORAL_TABLET | Freq: Once | ORAL | Status: AC
  Administered 2015-04-03: 5 mg via ORAL

## 2015-04-03 MED ORDER — INSULIN GLARGINE 100 UNIT/ML ~~LOC~~ SOLN
4.0000 [IU] | Freq: Two times a day (BID) | SUBCUTANEOUS | Status: DC
  Administered 2015-04-03 – 2015-04-04 (×2): 4 [IU] via SUBCUTANEOUS
  Filled 2015-04-03 (×3): qty 0.04

## 2015-04-03 NOTE — Progress Notes (Signed)
Patient is requesting apple juice.  CBG checked and this was 59.  On 120 cc given to and drank by patient.  F/U CBG was 80.  Patient does not want anything else to eat or drink at this time.  Will continue to monitor patient.  [employer] RN-BC, WTA.

## 2015-04-03 NOTE — Progress Notes (Signed)
ANTICOAGULATION CONSULT NOTE - Follow Up Consult  Pharmacy Consult for Lovenox and Coumadin, Vancomycin Indication: hx DVT, GPC bacteremia  Allergies  Allergen Reactions  . Depakote [Divalproex Sodium] Other (See Comments)    Pt gets "delirious"  . Morphine And Related Nausea And Vomiting and Other (See Comments)    "makes me delirious"  . Penicillins Anaphylaxis    Tolerated Zosyn Dec 2014  . Tramadol Nausea And Vomiting  . Imitrex [Sumatriptan] Other (See Comments)    seizures  . Vicodin [Hydrocodone-Acetaminophen] Itching and Rash    Patient Measurements: Height: 5\' 1"  (154.9 cm) Weight: 148 lb 9.4 oz (67.4 kg) IBW/kg (Calculated) : 47.8  Vital Signs: Temp: 100.8 F (38.2 C) (11/06 0817) Temp Source: Oral (11/06 0817) BP: 104/51 mmHg (11/06 0817) Pulse Rate: 88 (11/06 0817)  Labs:  Recent Labs  04/01/15 0419 04/02/15 0711 04/02/15 0837 04/03/15 0221  HGB 8.4* 8.6*  --  10.3*  HCT 27.3* 28.1*  --  32.8*  PLT 160 167  --  203  LABPROT  --   --  21.3* 21.8*  INR  --   --  1.85* 1.91*  CREATININE 4.64* 7.31*  --  4.94*    Estimated Creatinine Clearance: 13 mL/min (by C-G formula based on Cr of 4.94).  Assessment: 44 yo F on coumadin pta for hx DVT. Coumadin held on admission (last dose taken 11/2 at home) for possible neck biopsy, however, biopsy was cancelled and coumadin to resume. Pt received one dose of Coumadin 11/4 before plans were made to hold anticoag once again and proceed with biopsy inpatient on 11/7.  INR remains subtherapeutic at 1.91 but trending up.  Currently Lovenox and Coumadin both on hold until after biopsy.  In the interim, the patient also developed a fever Tm 102.9 (11/4) and continues to have low grade fevers on antibiotics.  Blood cx 2/2 GPC, final cx and sens pending.  ESRD on HD TTS, weight = 67.2kg.  Home Coumadin dose: 7.5mg  daily except 5mg  TTSat  Goal of Therapy:  INR 2-3 Monitor platelets by anticoagulation protocol: Yes    Plan:   Lovenox and Coumadin on hold.  Follow-up plans to restart anticoag after biopsy. Daily INR Vancomycin 750mg  IV qHD - TTS  [employer], Pharm.D., BCPS Clinical Pharmacist Pager [phone] 04/03/2015 1:20 PM

## 2015-04-03 NOTE — Progress Notes (Signed)
Subjective: Currently 2/2 bcx growing GPC in cluster. Tmax this morning 100.8, overall down from 102.9 2 days ago. Having some cough, denies any other complaints currently. Denies any chest pain/sob.n/v.   Objective: Vital signs in last 24 hours: Filed Vitals:   04/02/15 1458 04/02/15 1657 04/02/15 2008 04/03/15 0817  BP:  99/68 109/48 104/51  Pulse: 92 90 95 88  Temp: 100.5 F (38.1 C) 101.5 F (38.6 C) 99.7 F (37.6 C) 100.8 F (38.2 C)  TempSrc: Oral Oral Oral Oral  Resp: 17 17 18 17   Height:      Weight:   148 lb 9.4 oz (67.4 kg)   SpO2: 90% 91% 90% 92%   Weight change: -12 lb 0.8 oz (-5.466 kg)  Intake/Output Summary (Last 24 hours) at 04/03/15 1018 Last data filed at 04/03/15 0844  Gross per 24 hour  Intake    720 ml  Output   2515 ml  Net  -1795 ml   Physical Exam: Gen: AAOx3, NAD HEENT: bilateral enlarged supraclavicular LN, tender to palpation on right side CVS: RRR, S1 and S2 appreciated  Lungs: LLL crackles, sounds congested compared to yesterday.  Abd: soft, NT, ND.  Ext: no edema Skin: warm and dry    Lab Results: Basic Metabolic Panel:  Recent Labs Lab 04/02/15 0711 04/03/15 0221  NA 128* 132*  K 4.5 4.9  CL 94* 94*  CO2 22 24  GLUCOSE 440* 50*  BUN 47* 22*  CREATININE 7.31* 4.94*  CALCIUM 8.2* 8.8*  PHOS  --  2.5   Liver Function Tests:  Recent Labs Lab 03/30/15 1944 03/31/15 1555 04/03/15 0221  AST 15 32  --   ALT 10* 15  --   ALKPHOS 145* 174*  --   BILITOT 0.7 0.8  --   PROT 7.7 7.7  --   ALBUMIN 2.8* 2.8* 2.8*   CBC:  Recent Labs Lab 04/02/15 0711 04/02/15 0839 04/03/15 0221  WBC 8.0  --  9.5  NEUTROABS  --  5.8 6.4  HGB 8.6*  --  10.3*  HCT 28.1*  --  32.8*  MCV 93.4  --  94.3  PLT 167  --  203   CBG:  Recent Labs Lab 04/02/15 1656 04/02/15 2007 04/03/15 0213 04/03/15 0242 04/03/15 0453 04/03/15 0726  GLUCAP 104* 79 59* 80 113* 88   Hemoglobin A1C:  Recent Labs Lab 03/31/15 1912  HGBA1C 10.0*     Coagulation:  Recent Labs Lab 03/31/15 0746 04/02/15 0837 04/03/15 0221  LABPROT 19.5* 21.3* 21.8*  INR 1.64* 1.85* 1.91*    Micro Results: Recent Results (from the past 240 hour(s))  Culture, blood (routine x 2)     Status: None (Preliminary result)   Collection Time: 04/01/15  7:50 PM  Result Value Ref Range Status   Specimen Description BLOOD RIGHT ARM  Final   Special Requests IN PEDIATRIC BOTTLE 1CC  Final   Culture  Setup Time   Final    GRAM POSITIVE COCCI IN CLUSTERS AEROBIC BOTTLE ONLY CRITICAL RESULT CALLED TO, READ BACK BY AND VERIFIED WITH: A RIO 04/02/15 @ 29 M VESTAL    Culture GRAM POSITIVE COCCI  Final   Report Status PENDING  Incomplete  Culture, blood (routine x 2)     Status: None (Preliminary result)   Collection Time: 04/01/15  7:56 PM  Result Value Ref Range Status   Specimen Description BLOOD LEFT HAND  Final   Special Requests IN PEDIATRIC BOTTLE [facility]  Final  Culture  Setup Time   Final    GRAM POSITIVE COCCI IN CLUSTERS AEROBIC BOTTLE ONLY CRITICAL RESULT CALLED TO, READ BACK BY AND VERIFIED WITH: A RIO 04/02/15 @ 54 M VESTAL    Culture GRAM POSITIVE COCCI  Final   Report Status PENDING  Incomplete   Studies/Results: Dg Chest 2 View  04/02/2015  CLINICAL DATA:  44 year old female with a history of chest pain EXAM: CHEST - 2 VIEW COMPARISON:  CT 03/31/2015, chest x-ray 01/05/2015 FINDINGS: Cardiomediastinal silhouette unchanged. Low lung volumes with interstitial opacities bilaterally and patchy airspace opacities. No pneumothorax.  No pleural effusion. No displaced fracture. Unremarkable appearance of the upper abdomen. IMPRESSION: Interstitial opacities and patchy airspace opacities, potentially reflecting developing edema, consolidation, or atelectasis given the low lung volumes. Signed, [name]. [name], DO [facility] Radiology Electronically Signed   By: Corrie Mckusick D.O.   On: 04/02/2015  14:49   Ct Abdomen Pelvis W Contrast  04/01/2015  CLINICAL DATA:  Adenopathy on a recent chest CT. The patient complains of right neck swelling with mild adenopathy in the neck on a recent neck CT. EXAM: CT ABDOMEN AND PELVIS WITH CONTRAST TECHNIQUE: Multidetector CT imaging of the abdomen and pelvis was performed using the standard protocol following bolus administration of intravenous contrast. CONTRAST:  189mL OMNIPAQUE IOHEXOL 300 MG/ML  SOLN COMPARISON:  Recent neck and chest CT examinations. Abdomen and pelvis CT dated 11/26/2013. FINDINGS: Small amount of free peritoneal fluid. The lateral segment of the left lobe and the caudate lobe of the liver are enlarged and the right lobe is somewhat small. Normal sized spleen. Oval, mass-like area in the left lower abdomen and upper pelvis, measuring 4.0 x 2.3 cm on image number 54 and 4.4 cm in length on sagittal image number 119. This has a peripheral medium density component containing calcifications and is lower in density centrally. This has been present on multiple previous examinations and is slightly smaller than on 09/14/2010. Right common and external iliac artery stent. Bilateral femoral artery bypass grafts. The grafts appear patent. Cholecystectomy clips. Poor distention of the stomach with diffuse wall thickening. No small bowel or colonic abnormalities. No evidence of appendicitis. Mildly prominent bilateral common iliac lymph nodes with mild progression on the right. The large node on the right measures 9 mm in short axis diameter on image number 68. This measured 8 mm in short axis diameter on 11/26/2013. Mildly prominent bilateral internal iliac lymph nodes are unchanged. Minimally prominent retroperitoneal nodes without abnormal enlargement. Small kidneys containing multiple cysts. Atheromatous arterial calcifications. Unremarkable uterus and ovaries. The pancreas and adrenal glands are also unremarkable. Minimal dependent atelectasis at both  lung bases. Unremarkable bones. IMPRESSION: 1. Interval small amount of ascites in the abdomen and pelvis. 2. Probable early changes of cirrhosis of the liver. 3. Poor distention of the stomach with diffuse wall thickening. The wall thickening could be due to underdistention, gastritis or diffuse neoplastic involvement, with differential considerations including gastric carcinoma and lymphoma. 4. Borderline enlarged bilateral common iliac lymph nodes with mild progression, specially on the right. Otherwise, no adenopathy. 5. Bilateral renal atrophy and multiple cysts. 6. Stable to slightly decreased in size of a probable old left renal transplant. Electronically Signed   By: Claudie Revering M.D.   On: 04/01/2015 17:06   Medications: I have reviewed the patient's current medications. Scheduled Meds: . calcium acetate  667 mg Oral BID WC  . clonazePAM  1 mg Oral QHS  . [  START ON 04/07/2015] darbepoetin (ARANESP) injection - DIALYSIS  200 mcg Intravenous Q Thu-HD  . doxercalciferol  1 mcg Intravenous Q T,Th,Sa-HD  . enoxaparin (LOVENOX) injection  1 mg/kg Subcutaneous Q24H  . feeding supplement (NEPRO CARB STEADY)  237 mL Oral BID BM  . ferric gluconate (FERRLECIT/NULECIT) IV  125 mg Intravenous Q T,Th,Sa-HD  . gabapentin  100 mg Oral Daily  . guaiFENesin  600 mg Oral BID  . insulin aspart  0-9 Units Subcutaneous TID WC  . insulin aspart  3 Units Subcutaneous TID WC  . insulin glargine  7 Units Subcutaneous BID  . lidocaine  1 patch Transdermal Q24H  . metoCLOPramide  10 mg Oral TID AC & HS  . montelukast  10 mg Oral QHS  . multivitamin  1 tablet Oral Daily  . pantoprazole  40 mg Oral QHS  . simvastatin  20 mg Oral QHS  . 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 Also got CT abdomen which shows small ascites, some cirrhotic changes of liver, diffuse stomach wall thickening concerning for gastritis vs neoplastic involvement (gastric carcinoma vs lymphoma?). HIV and LDH neg.  - appreciate IR help, plan for IR biopsy 11/7.  - Oxycodone 5 mg q8 prn painy - oncology f/up after tissue biopsy.   Fever 11/4 evening with T102.9. Still having fevers but down trending. 2/2 [place] growing GPC in cluster concerning for Staph, we did start Vancomycin 11/5. - cont vanc 11/5 >> - TTE to evaluate for vegetations - f/up BCX from 11/4, repeat Perrin 11/6.  - has some crackes on lung exam, likely from atelectasis. Start Incentive spirometer, ambulate, and sit up as much as possible. If no improvement, will repeat CXR tomorrow for possible PNA developing.   Severe hyperglycemia with DM Type I - did not receive insulin over 24 hours before we admitted her. Glucose was as high as 1000, without acidosis or anion gap. She was on insulin drip, and has been transitioned to lantus 7 untis BID with SSI and meal time insulin - now she is more hypoglycemic in 80's. D/ced meal time insulin today and reduced lantus to 4 units BID.  Cont monitoring closely.   Pseudohyponatremia - monitor with insulin regmine.  H/o LLE DVT (01/19/2015):  holding home coumadin in anticipation of biopsy, bridge with lovenox for now.    HFpEF: Stable. TTE in 03/2014 with LVEF 60% and mild LVH. AV sclerosis w/o stenosis.  -Monitor   ESRD on HD (TTS): New left AVG in 12/2014 - appreciate nephro recs.   HTN: now hypotensive. Holding home Amlodipine 10 mg  qd, Metoprolol 50 mg qd.   GERD: Protonix 40 mg qd    Code: Full  Dispo: Disposition is deferred at this time, awaiting improvement of current medical problems.  Anticipated discharge in approximately 0 day(s).   The patient does have a current PCP [name], MD) and does need an [facility] hospital follow-up appointment after discharge.  The patient does not have transportation limitations that  hinder transportation to clinic appointments.  .Services Needed at time of discharge: Y = Yes, Blank = No PT:   OT:   RN:   Equipment:   Other:     LOS: 3 days   Dellia Nims, MD 04/03/2015, 10:18 AM

## 2015-04-03 NOTE — Progress Notes (Signed)
[facility] Progress Note   Subjective: feeling better, walking the halls. For biopsy tomorrow by IR  Filed Vitals:   04/02/15 1458 04/02/15 1657 04/02/15 2008 04/03/15 0817  BP:  99/68 109/48 104/51  Pulse: 92 90 95 88  Temp: 100.5 F (38.1 C) 101.5 F (38.6 C) 99.7 F (37.6 C) 100.8 F (38.2 C)  TempSrc: Oral Oral Oral Oral  Resp: 17 17 18 17   Height:      Weight:   67.4 kg (148 lb 9.4 oz)   SpO2: 90% 91% 90% 92%   Exam: Alert no distress R neck mild swelling and very tender R upper / ant chest tender also, improving though Chest clear bilat RRR 1/6 SEM no RG Abd soft ntnd obese +bs No LE edema R and L thigh AVG (using right; left placed August 19th in anticipation of needing in the future) both pos bruit Neuro is nf , ox 3  NW TTS  3h 65min   66.5kg  2/2.5 bath  400/600  Bilat fem AVG - using R  Heparin none Hect 1 Venofer 100x 2 then 50/wk Mircera 225 q2 last 10/6     Assessment: 1. R chest and neck pain - soft tissue mass R chest and lymphadenopathy by CT. For LN biopsy by IR tomorrow 2. Fever - empiric abx on Vanc per primary, temps coming down 3. ESRD TTS hd 4. HTN cont amlod/ MTP 5. Anemia max ESA 6. MBD cont hect, binders 7. Chronic coumadin for DVT - CKA managing as OP 8. DM per prim 9. Vol stable, just over dry  Plan - next HD Talmadge Chad MD [facility] pager [phone]    cell [phone] 04/03/2015, 1:37 PM    Recent Labs Lab 04/01/15 0419 04/02/15 0711 04/03/15 0221  NA 135 128* 132*  K 3.8 4.5 4.9  CL 97* 94* 94*  CO2 23 22 24   GLUCOSE 256* 440* 50*  BUN 28* 47* 22*  CREATININE 4.64* 7.31* 4.94*  CALCIUM 8.5* 8.2* 8.8*  PHOS  --   --  2.5    Recent Labs Lab 03/30/15 1944 03/31/15 1555 04/03/15 0221  AST 15 32  --   ALT 10* 15  --   ALKPHOS 145* 174*  --   BILITOT 0.7 0.8  --   PROT 7.7 7.7  --   ALBUMIN 2.8* 2.8* 2.8*    Recent Labs Lab 03/30/15 1944 04/01/15 0419 04/02/15 0711  04/02/15 0839 04/03/15 0221  WBC 6.5 7.5 8.0  --  9.5  NEUTROABS 4.9  --   --  5.8 6.4  HGB 8.9* 8.4* 8.6*  --  10.3*  HCT 29.9* 27.3* 28.1*  --  32.8*  MCV 94.9 92.9 93.4  --  94.3  PLT 159 160 167  --  203   . calcium acetate  667 mg Oral BID WC  . clonazePAM  1 mg Oral QHS  . [START ON 04/07/2015] darbepoetin (ARANESP) injection - DIALYSIS  200 mcg Intravenous Q Thu-HD  . doxercalciferol  1 mcg Intravenous Q T,Th,Sa-HD  . enoxaparin (LOVENOX) injection  1 mg/kg Subcutaneous Q24H  . feeding supplement (NEPRO CARB STEADY)  237 mL Oral BID BM  . ferric gluconate (FERRLECIT/NULECIT) IV  125 mg Intravenous Q T,Th,Sa-HD  . gabapentin  100 mg Oral Daily  . guaiFENesin  600 mg Oral BID  . insulin aspart  0-9 Units Subcutaneous TID WC  . insulin glargine  4 Units Subcutaneous BID  .  lidocaine  1 patch Transdermal Q24H  . metoCLOPramide  10 mg Oral TID AC & HS  . montelukast  10 mg Oral QHS  . multivitamin  1 tablet Oral Daily  . pantoprazole  40 mg Oral QHS  . simvastatin  20 mg Oral QHS  . [START ON 04/05/2015] vancomycin  750 mg Intravenous Q T,Th,Sa-HD     acetaminophen **OR** acetaminophen, albuterol, dextrose, oxyCODONE, promethazine, senna-docusate, zolpidem

## 2015-04-03 NOTE — H&P (Signed)
Chief Complaint: Patient was seen in consultation today for lymphadenopathy  Chief Complaint  Patient presents with  . Lymphadenopathy    The patient said her neck started swelling on the left side about two weeks ago.  The paitent said that two days ago her right side of her neck started swelling as well.  She also says it is painful.   at the request of Dr. Dareen Piano  Referring Physician(s): Dr. Dareen Piano   History of Present Illness: [name] is a 44 y.o. female who presented to ED with complaints of neck swelling x 2 weeks, now painful. She denies any weight loss or night sweats. She had imaging that revealed lymphadenopathy and IR received request for image guided lymph node biopsy. She denies any chest pain, shortness of breath or palpitations. She denies any active signs of bleeding or excessive bruising. She did have fevers yesterday and today. The patient denies any history of sleep apnea or chronic oxygen use. She has previously tolerated sedation without complications.    Past Medical History  Diagnosis Date  . Hyperlipidemia   . Alopecia   . Obesity   . Iron deficiency   . RLS (restless legs syndrome)   . Injury of conjunctiva and corneal abrasion of right eye without foreign body   . Cellulitis   . Anemia   . Blood transfusion 2004    "when I had my transplant"  . Migraine   . Hyperparathyroidism, secondary ([place])   . Diabetic retinopathy   . Hypertension     takes Metoprolol and Exforge daily  . Depression     takes Klonopin nightly  . Diabetes mellitus type 1 (HCC)     Pt states diagnosed at age 8 with prior episodes of DKA. S/p pancreatic transplant   . GERD (gastroesophageal reflux disease)     takes Protonix daily  . Bronchitis   . Migraine   . Asthma   . Emphysema of lung ([place])   . Angina     none in past 2 years.  . Stomach pain     Hx: of chronic  . Pneumonia 2012    "double"  . Dialysis patient ([place])     tues,thurs,sat  . 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  Surgery was done at [facility].     . DKA (diabetic ketoacidoses) ([place]) 12/01/2014  . Diastolic CHF, acute on chronic ([place]) 01/02/2015  . Stroke [facility]) 03/2014    ? TIA  . ESRD (end stage renal disease) [facility])     S/p pancreatic and kidney transplant, but back on HD 2008, dialysis at [place], Sat    Past Surgical History  Procedure Laterality Date  . 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 Thyroglobulin      x 7  . Av fistula placement      right upper arm  . Cholecystectomy  1995  .  hd graft placement/removal      "had 2 in my left upper arm"  . Eye surgery    . Retinopathy surgery    . Tooth extraction  10/10/11    X's two  . Insertion of dialysis catheter  12/08/2011    Procedure: INSERTION OF DIALYSIS  CATHETER;  Surgeon: Angelia Mould, MD;  Location: [place];  Service: Vascular;  Laterality: Left;  . Av fistula placement  01/22/2012    Procedure: INSERTION OF ARTERIOVENOUS (AV) GORE-TEX GRAFT ARM;  Surgeon: Angelia Mould, MD;  Location: [place];  Service: Vascular;  Laterality: Left;  Ultrasound guided.  . Av fistula placement  03/14/2012    Procedure: INSERTION OF ARTERIOVENOUS (AV) GORE-TEX GRAFT THIGH;  Surgeon: Angelia Mould, MD;  Location: [place];  Service: Vascular;  Laterality: Right;  . False aneurysm repair Right 01/02/2013    Procedure: Excision of lymphocele in right thigh.;  Surgeon: Angelia Mould, MD;  Location: [place];  Service: Vascular;  Laterality: Right;  . Carpal tunnel release Right 03/02/2013    Procedure: CARPAL TUNNEL RELEASE;  Surgeon: Tennis Must, MD;  Location: [place];  Service: Orthopedics;  Laterality: Right;  . Flexible  sigmoidoscopy N/A 03/24/2013    Procedure: FLEXIBLE SIGMOIDOSCOPY;  Surgeon: Cleotis Nipper, MD;  Location: [facility] ENDOSCOPY;  Service: Endoscopy;  Laterality: N/A;  . Revision of arteriovenous goretex graft Right 04/02/2013    Procedure: REPAIR OF PSEUDOANEURYSM OF ARTERIOVENOUS GORETEX GRAFT;  Surgeon: Angelia Mould, MD;  Location: [place];  Service: Vascular;  Laterality: Right;  . Carpal tunnel release Left 10/16/2013    Procedure: LEFT CARPAL TUNNEL RELEASE;  Surgeon: Tennis Must, MD;  Location: [place];  Service: Orthopedics;  Laterality: Left;  . Unilateral upper extremeity angiogram N/A 11/12/2011    Procedure: UNILATERAL UPPER Anselmo Rod;  Surgeon: Angelia Mould, MD;  Location: [facility] CATH LAB;  Service: Cardiovascular;  Laterality: N/A;  . Fistulogram Left 03/03/2012    Procedure: FISTULOGRAM;  Surgeon: Angelia Mould, MD;  Location: [facility] CATH LAB;  Service: Cardiovascular;  Laterality: Left;  . Av fistula placement Left 01/14/2015    Procedure: INSERTION OF LEFT ARTERIOVENOUS (AV) GORE-TEX GRAFT THIGH;  Surgeon: Angelia Mould, MD;  Location: [place];  Service: Vascular;  Laterality: Left;    Allergies: Depakote; Morphine and related; Penicillins; Tramadol; Imitrex; and Vicodin  Medications: Prior to Admission medications   Medication Sig Start Date End Date Taking? Authorizing Provider  albuterol (PROVENTIL HFA;VENTOLIN HFA) 108 (90 BASE) MCG/ACT inhaler Inhale 2 puffs into the lungs every 4 (four) hours as needed for wheezing or shortness of breath (as per home regimen). 02/20/13  Yes Kinnie Feil, MD  amLODipine (NORVASC) 10 MG tablet Take 10 mg by mouth at bedtime.  11/17/14  Yes Historical Provider, MD  aspirin 325 MG tablet Take 1 tablet (325 mg total) by mouth daily. 05/20/14  Yes Velvet Bathe, MD  calcium acetate (PHOSLO) 667 MG capsule Take 1 capsule (667 mg total) by mouth 2 (two) times daily. Patient taking differently: Take  2,001 mg by mouth 3 (three) times daily with meals.  11/16/14  Yes Theodis Blaze, MD  clonazePAM (KLONOPIN) 0.5 MG tablet Take 1 mg by mouth at bedtime.    Yes Historical Provider, MD  Darbepoetin Alfa (ARANESP) 40 MCG/0.4ML SOSY injection Inject 0.4 mLs (40 mcg total) into the vein every Thursday with hemodialysis. 08/05/14  Yes Geradine Girt, DO  doxercalciferol (HECTOROL) 4 MCG/2ML injection Inject 1 mL (2 mcg total) into the vein Every Tuesday,Thursday,and Saturday with dialysis. 08/05/14  Yes Geradine Girt, DO  gabapentin (NEURONTIN) 100 MG capsule Take 100 mg by mouth daily.    Yes Historical Provider, MD  guaiFENesin (MUCINEX) 600 MG 12 hr tablet Take  1 tablet (600 mg total) by mouth 2 (two) times daily. 01/07/15  Yes Barton Dubois, MD  insulin glargine (LANTUS) 100 UNIT/ML injection Inject 0.07 mLs (7 Units total) into the skin 2 (two) times daily. 08/05/14  Yes Geradine Girt, DO  insulin lispro (HUMALOG KWIKPEN) 100 UNIT/ML KiwkPen Inject 6 Units into the skin 2 (two) times daily.    Yes Historical Provider, MD  lidocaine (LIDODERM) 5 % Place 1 patch onto the skin daily. Remove & Discard patch within 12 hours or as directed by MD 12/06/14  Yes Kelvin Cellar, MD  metoCLOPramide (REGLAN) 10 MG tablet Take 1 tablet (10 mg total) by mouth 4 (four) times daily -  before meals and at bedtime. 12/06/14  Yes Kelvin Cellar, MD  metoprolol succinate (TOPROL-XL) 50 MG 24 hr tablet Take 1 tablet (50 mg total) by mouth daily. Take with or immediately following a meal. 01/07/15  Yes Barton Dubois, MD  montelukast (SINGULAIR) 10 MG tablet Take 10 mg by mouth at bedtime.   Yes Historical Provider, MD  multivitamin (RENA-VIT) TABS tablet Take 1 tablet by mouth daily. 08/05/14  Yes Geradine Girt, DO  Nutritional Supplements (FEEDING SUPPLEMENT, NEPRO CARB STEADY,) LIQD Take 237 mLs by mouth 2 (two) times daily between meals. 04/23/14  Yes Orson Eva, MD  Oxycodone HCl 10 MG TABS Take 10 mg by mouth 3 (three)  times daily as needed (for pain).  03/23/15  Yes Historical Provider, MD  oxyCODONE-acetaminophen (PERCOCET/ROXICET) 5-325 MG tablet Take 1-2 tablets by mouth every 6 (six) hours as needed for severe pain. 03/02/15  Yes Angelia Mould, MD  pantoprazole (PROTONIX) 40 MG tablet Take 1 tablet (40 mg total) by mouth at bedtime. Patient taking differently: Take 40 mg by mouth daily. Takes daily in the afternoon 08/05/14  Yes Geradine Girt, DO  promethazine (PHENERGAN) 25 MG tablet Take 1 tablet (25 mg total) by mouth every 6 (six) hours as needed for nausea or vomiting. 12/06/14  Yes Kelvin Cellar, MD  simvastatin (ZOCOR) 20 MG tablet Take 1 tablet (20 mg total) by mouth at bedtime. 05/20/14  Yes Velvet Bathe, MD  warfarin (COUMADIN) 5 MG tablet Take 0.5 tablets (2.5 mg total) by mouth daily at 6 PM. Patient taking differently: Take 5-7.5 mg by mouth See admin instructions. Take 1 tablet on Tuesday, Thursday and Saturday then take 1 and 1/2 tablets on Monday, Wednesday, Friday and Sunday 01/23/15  Yes Ulyses Amor, PA-C  zolpidem (AMBIEN) 5 MG tablet Take 5 mg by mouth at bedtime as needed. sleep 02/09/14  Yes Historical Provider, MD  enoxaparin (LOVENOX) 60 MG/0.6ML injection Inject 0.7 mLs (70 mg total) into the skin daily. 04/02/15 04/05/15  Jones Bales, MD     Family History  Problem Relation Age of Onset  . Hypertension Mother   . Kidney disease Mother   . Diabetes Mother   . Hypertension Father   . Kidney disease Father   . Diabetes Father     Social History   Social History  . Marital Status: Single    Spouse Name: N/A  . Number of Children: N/A  . Years of Education: N/A   Occupational History  . disabled    Social History Main Topics  . Smoking status: Never Smoker   . Smokeless tobacco: Never Used  . Alcohol Use: No  . Drug Use: No  . Sexual Activity: Yes    Birth Control/ Protection: None   Other Topics Concern  .  None   Social History Narrative   Lives at  home with fiance, has a 54yo daughter. Ambulatory without cane or walker. Never smoker, no drugs or alcohol. Lost mom last year, has no brothers or sisters.     Review of Systems: A 12 point ROS discussed and pertinent positives are indicated in the HPI above.  All other systems are negative.  Review of Systems  Vital Signs: BP 104/51 mmHg  Pulse 88  Temp(Src) 100.8 F (38.2 C) (Oral)  Resp 17  Ht 5\' 1"  (1.549 m)  Wt 148 lb 9.4 oz (67.4 kg)  BMI 28.09 kg/m2  SpO2 92%  LMP 03/02/2015  Physical Exam  Constitutional: She is oriented to person, place, and time. No distress.  HENT:  Head: Normocephalic and atraumatic.  Cardiovascular: Normal rate and regular rhythm.  Exam reveals no gallop and no friction rub.   No murmur heard. Pulmonary/Chest: Effort normal and breath sounds normal.  Lymphadenopathy:    She has cervical adenopathy.  Neurological: She is alert and oriented to person, place, and time.  Skin: Skin is warm and dry. She is not diaphoretic.    Mallampati Score:  MD Evaluation Airway: WNL Heart: WNL Abdomen: WNL Chest/ Lungs: WNL ASA  Classification: 3 Mallampati/Airway Score: Two  Imaging: Dg Chest 2 View  04/02/2015  CLINICAL DATA:  44 year old female with a history of chest pain EXAM: CHEST - 2 VIEW COMPARISON:  CT 03/31/2015, chest x-ray 01/05/2015 FINDINGS: Cardiomediastinal silhouette unchanged. Low lung volumes with interstitial opacities bilaterally and patchy airspace opacities. No pneumothorax.  No pleural effusion. No displaced fracture. Unremarkable appearance of the upper abdomen. IMPRESSION: Interstitial opacities and patchy airspace opacities, potentially reflecting developing edema, consolidation, or atelectasis given the low lung volumes. Signed, [name]. [name], DO [facility] Electronically Signed   By: Corrie Mckusick D.O.   On: 04/02/2015 14:49   Ct Soft Tissue Neck W  Contrast  03/31/2015  CLINICAL DATA:  LEFT neck swelling for 2 weeks, 2 days of RIGHT neck swelling. Pain. History of diabetes, end-stage renal disease on dialysis, status post kidney pancreatic transplant. EXAM: CT NECK WITH CONTRAST TECHNIQUE: Multidetector CT imaging of the neck was performed using the standard protocol following the bolus administration of intravenous contrast. CONTRAST:  12mL OMNIPAQUE IOHEXOL 300 MG/ML  SOLN COMPARISON:  None. FINDINGS: Pharynx and larynx: Prominent uvula partially effacing the airway, palatine tonsils are not enlarged or inflamed. Small retropharyngeal effusion without discrete abscess. Mild hypo pharyngeal edema. No abscess. Larynx is normal. Salivary glands: Normal. Thyroid: Normal. Lymph nodes: Lymphadenopathy throughout the neck, including round LEFT supraclavicular 13 mm short axis lymph node, 9 mm short axis RIGHT paratracheal lymph node. Rounded RIGHT level IIa 10 mm lymph node. Vascular: Moderate calcific atherosclerosis of the carotid bulbs, advanced for age. 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 Enlarged patent bilateral external jugular veins. Limited intracranial: Normal. Visualized orbits: Normal. Mastoids and visualized paranasal sinuses: Well aerated. 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 Multiple absent teeth. Upper chest: Pulmonary vascular congestion, suspected cardiomegaly. Partially imaged mediastinal lymphadenopathy. IMPRESSION: Mild pharyngitis. Prominent uvula partially effaces the airway. Small retropharyngeal effusion without abscess. Mild lymphadenopathy throughout the neck, partially imaged mediastinal lymphadenopathy for which dedicated CT of the chest with contrast is recommended. Partially imaged cardiomegaly and pulmonary vascular congestion. Chronically occluded bilateral internal jugular veins, enlarged patent external jugular veins.  Electronically Signed   By: Thana Farr.D.  On: 03/31/2015 03:20   Ct Chest W Contrast  03/31/2015  CLINICAL DATA:  RIGHT side neck pain,swelling x 2 days; pt had CT Neck done, which was suggestive of lymphadenopathy; pt has dry cough,denies any chest painESRD pt, to be dialyzed later today EXAM: CT CHEST WITH CONTRAST TECHNIQUE: Multidetector CT imaging of the chest was performed during intravenous contrast administration. CONTRAST:  52mL OMNIPAQUE IOHEXOL 300 MG/ML  SOLN COMPARISON:  None. FINDINGS: The central airways are patent. There is no focal consolidation, pleural effusion or pneumothorax. There is a 6 mm subpleural left lower lobe pulmonary nodule (image 37/ series 3). There is mild bilateral axillary lymphadenopathy. The largest left axillary lymph node measures 12 mm. The largest right axillary lymph node measures 12.6 mm. There is mediastinal lymphadenopathy with the largest right upper paratracheal lymph node measuring 12.5 mm. There is an enlarged subcarinal lymph node measuring 2 cm. There is supraclavicular lymphadenopathy with the largest right supraclavicular lymph node measuring 12 mm. The largest left infraclavicular lymph node measures 13.8 mm. There is a spiculated masslike area in the medial aspect of the right pectoralis major muscle with ill-defined margins and haziness in the surrounding fat with the overall area measuring 5.3 x 2.3 cm. The heart size is normal. There is coronary artery atherosclerosis in the LAD. There are numerous left chest wall collaterals vessels. There is no pericardial effusion. The thoracic aorta is normal in caliber. Review of bone windows demonstrates no focal lytic or sclerotic lesions. Limited non-contrast images of the upper abdomen were obtained. The adrenal glands appear normal. The remainder of the visualized abdominal organs are unremarkable. IMPRESSION: 1. Nonspecific bilateral supraclavicular/infraclavicular, axillary and mediastinal  lymphadenopathy. Spiculated masslike area in the medial aspect of the right pectoralis major muscle with ill-defined margins. The appearance is most concerning for malignancy until proven otherwise. Malignancy such as lymphoma or metastatic disease are the 2 main considerations. Tissue diagnosis recommended. 2. Nonspecific 6 mm subpleural left lower lobe pulmonary nodule. If the patient is at high risk for bronchogenic carcinoma, follow-up chest CT at 6-12 months is recommended. If the patient is at low risk for bronchogenic carcinoma, follow-up chest CT at 12 months is recommended. This recommendation follows the consensus statement: Guidelines for Management of Small Pulmonary Nodules Detected on CT Scans: A Statement from the [place] as published in Radiology 2005;237:395-400. Electronically Signed   By: Kathreen Devoid   On: 03/31/2015 08:47   Ct Abdomen Pelvis W Contrast  04/01/2015  CLINICAL DATA:  Adenopathy on a recent chest CT. The patient complains of right neck swelling with mild adenopathy in the neck on a recent neck CT. EXAM: CT ABDOMEN AND PELVIS WITH CONTRAST TECHNIQUE: Multidetector CT imaging of the abdomen and pelvis was performed using the standard protocol following bolus administration of intravenous contrast. CONTRAST:  141mL OMNIPAQUE IOHEXOL 300 MG/ML  SOLN COMPARISON:  Recent neck and chest CT examinations. Abdomen and pelvis CT dated 11/26/2013. FINDINGS: Small amount of free peritoneal fluid. The lateral segment of the left lobe and the caudate lobe of the liver are enlarged and the right lobe is somewhat small. Normal sized spleen. Oval, mass-like area in the left lower abdomen and upper pelvis, measuring 4.0 x 2.3 cm on image number 54 and 4.4 cm in length on sagittal image number 119. This has a peripheral medium density component containing calcifications and is lower in density centrally. This has been present on multiple previous examinations and is slightly smaller than  on 09/14/2010. Right common  and external iliac artery stent. Bilateral femoral artery bypass grafts. The grafts appear patent. Cholecystectomy clips. Poor distention of the stomach with diffuse wall thickening. No small bowel or colonic abnormalities. No evidence of appendicitis. Mildly prominent bilateral common iliac lymph nodes with mild progression on the right. The large node on the right measures 9 mm in short axis diameter on image number 68. This measured 8 mm in short axis diameter on 11/26/2013. Mildly prominent bilateral internal iliac lymph nodes are unchanged. Minimally prominent retroperitoneal nodes without abnormal enlargement. Small kidneys containing multiple cysts. Atheromatous arterial calcifications. Unremarkable uterus and ovaries. The pancreas and adrenal glands are also unremarkable. Minimal dependent atelectasis at both lung bases. Unremarkable bones. IMPRESSION: 1. Interval small amount of ascites in the abdomen and pelvis. 2. Probable early changes of cirrhosis of the liver. 3. Poor distention of the stomach with diffuse wall thickening. The wall thickening could be due to underdistention, gastritis or diffuse neoplastic involvement, with differential considerations including gastric carcinoma and lymphoma. 4. Borderline enlarged bilateral common iliac lymph nodes with mild progression, specially on the right. Otherwise, no adenopathy. 5. Bilateral renal atrophy and multiple cysts. 6. Stable to slightly decreased in size of a probable old left renal transplant. Electronically Signed   By: Claudie Revering M.D.   On: 04/01/2015 17:06    Labs:  CBC:  Recent Labs  03/30/15 1944 04/01/15 0419 04/02/15 0711 04/03/15 0221  WBC 6.5 7.5 8.0 9.5  HGB 8.9* 8.4* 8.6* 10.3*  HCT 29.9* 27.3* 28.1* 32.8*  PLT 159 160 167 203    COAGS:  Recent Labs  01/23/15 0413 03/31/15 0746 04/02/15 0837 04/03/15 0221  INR 2.06* 1.64* 1.85* 1.91*    BMP:  Recent Labs  04/01/15 0043  04/01/15 0419 04/02/15 0711 04/03/15 0221  NA 134* 135 128* 132*  K 3.7 3.8 4.5 4.9  CL 97* 97* 94* 94*  CO2 23 23 22 24   GLUCOSE 138* 256* 440* 50*  BUN 24* 28* 47* 22*  CALCIUM 8.7* 8.5* 8.2* 8.8*  CREATININE 4.21* 4.64* 7.31* 4.94*  GFRNONAA 12* 11* 6* 10*  GFRAA 14* 12* 7* 12*    LIVER FUNCTION TESTS:  Recent Labs  12/01/14 1017  01/02/15 1727  01/22/15 1408 03/30/15 1944 03/31/15 1555 04/03/15 0221  BILITOT QUANTITY NOT SUFFICIENT, UNABLE TO PERFORM TEST  --  1.7*  --   --  0.7 0.8  --   AST 64*  --  93*  --   --  15 32  --   ALT QUANTITY NOT SUFFICIENT, UNABLE TO PERFORM TEST  --  35  --   --  10* 15  --   ALKPHOS 134*  --  160*  --   --  145* 174*  --   PROT 7.6  --  7.2  --   --  7.7 7.7  --   ALBUMIN 3.5  < > 3.7  < > 2.8* 2.8* 2.8* 2.8*  < > = values in this interval not displayed.   Assessment and Plan: Lymphadenopathy x 2 weeks- denies night sweats or weight loss ESRD s/p failed transplant, now on HD HTN DM History of DVT on coumadin- being held Request for image guided lymph node biopsy with sedation The patient will be NPO, no blood thinners taken- Coumadin held and Lovenox to be held, labs and vitals have been reviewed. Risks and Benefits discussed with the patient including, but not limited to bleeding, infection, damage to adjacent structures or low yield requiring  additional tests. All of the patient's questions were answered, patient is agreeable to proceed. Consent signed and in chart.    Thank you for this interesting consult.  I greatly enjoyed meeting [name] and look forward to participating in their care.  A copy of this report was sent to the requesting provider on this date.  SignedHedy Jacob 04/03/2015, 9:38 AM   I spent a total of 20 Minutes in face to face in clinical consultation, greater than 50% of which was counseling/coordinating care for lymphadenopathy

## 2015-04-03 NOTE — Progress Notes (Signed)
Patient seen and examined. Case d/w residents in detail. I agree with findings and plan as documented in Dr. Serita Grit note.  Patient with persistent cough and mild LLL crackles. Still with intermittent low grade fevers. Blood cx are positive for 2/2 GPC in clusters. Will c/w vancomycin for now. Would repeat CXR after HD to assess for consolidation. Patient scheduled for IR guided biopsy of enlarged cervical LN on Monday. Continue to hold coumadin for procedure. Will f/u path

## 2015-04-04 ENCOUNTER — Inpatient Hospital Stay (HOSPITAL_COMMUNITY): Payer: Medicare Other

## 2015-04-04 ENCOUNTER — Ambulatory Visit (HOSPITAL_COMMUNITY): Payer: Medicare Other

## 2015-04-04 DIAGNOSIS — R7881 Bacteremia: Secondary | ICD-10-CM

## 2015-04-04 DIAGNOSIS — B957 Other staphylococcus as the cause of diseases classified elsewhere: Secondary | ICD-10-CM

## 2015-04-04 LAB — GLUCOSE, CAPILLARY
GLUCOSE-CAPILLARY: 117 mg/dL — AB (ref 65–99)
GLUCOSE-CAPILLARY: 60 mg/dL — AB (ref 65–99)
GLUCOSE-CAPILLARY: 218 mg/dL — AB (ref 65–99)
Glucose-Capillary: 79 mg/dL (ref 65–99)
Glucose-Capillary: 66 mg/dL (ref 65–99)
Glucose-Capillary: 72 mg/dL (ref 65–99)

## 2015-04-04 LAB — CULTURE, BLOOD (ROUTINE X 2)

## 2015-04-04 LAB — PROTIME-INR
INR: 1.61 — ABNORMAL HIGH (ref 0.00–1.49)
Prothrombin Time: 19.1 seconds — ABNORMAL HIGH (ref 11.6–15.2)

## 2015-04-04 MED ORDER — DEXTROSE 50 % IV SOLN
1.0000 | Freq: Once | INTRAVENOUS | Status: DC
  Filled 2015-04-04: qty 50

## 2015-04-04 MED ORDER — WARFARIN - PHARMACIST DOSING INPATIENT
Freq: Every day | Status: DC
  Administered 2015-04-07: 18:00:00

## 2015-04-04 MED ORDER — DM-GUAIFENESIN ER 30-600 MG PO TB12
1.0000 | ORAL_TABLET | Freq: Two times a day (BID) | ORAL | Status: DC
  Administered 2015-04-04 – 2015-04-12 (×15): 1 via ORAL
  Filled 2015-04-04 (×16): qty 1

## 2015-04-04 MED ORDER — INSULIN GLARGINE 100 UNIT/ML ~~LOC~~ SOLN
2.0000 [IU] | Freq: Two times a day (BID) | SUBCUTANEOUS | Status: DC
  Administered 2015-04-04 – 2015-04-05 (×2): 2 [IU] via SUBCUTANEOUS
  Filled 2015-04-04 (×4): qty 0.02

## 2015-04-04 MED ORDER — MIDAZOLAM HCL 2 MG/2ML IJ SOLN
INTRAMUSCULAR | Status: AC | PRN
  Administered 2015-04-04: 1 mg via INTRAVENOUS

## 2015-04-04 MED ORDER — MIDAZOLAM HCL 2 MG/2ML IJ SOLN
INTRAMUSCULAR | Status: AC
  Filled 2015-04-04: qty 4

## 2015-04-04 MED ORDER — FENTANYL CITRATE (PF) 100 MCG/2ML IJ SOLN
INTRAMUSCULAR | Status: AC
  Filled 2015-04-04: qty 4

## 2015-04-04 MED ORDER — LIDOCAINE HCL (PF) 1 % IJ SOLN
INTRAMUSCULAR | Status: AC
  Filled 2015-04-04: qty 10

## 2015-04-04 MED ORDER — OXYCODONE HCL 5 MG PO TABS
5.0000 mg | ORAL_TABLET | Freq: Four times a day (QID) | ORAL | Status: DC | PRN
  Administered 2015-04-04 – 2015-04-07 (×4): 5 mg via ORAL
  Filled 2015-04-04 (×5): qty 1

## 2015-04-04 MED ORDER — ENOXAPARIN SODIUM 80 MG/0.8ML ~~LOC~~ SOLN: 70.0000 mg | SUBCUTANEOUS | Status: DC

## 2015-04-04 MED ORDER — SERTRALINE HCL 50 MG PO TABS: 25.0000 mg | ORAL_TABLET | Freq: Every day | ORAL | Status: DC

## 2015-04-04 MED ORDER — WARFARIN SODIUM 7.5 MG PO TABS
7.5000 mg | ORAL_TABLET | ORAL | Status: AC
  Administered 2015-04-04: 7.5 mg via ORAL
  Filled 2015-04-04: qty 1

## 2015-04-04 MED ORDER — GLUCOSE 40 % PO GEL
ORAL | Status: AC
  Administered 2015-04-04: 37.5 g
  Filled 2015-04-04: qty 1

## 2015-04-04 MED ORDER — SERTRALINE HCL 50 MG PO TABS: 50.0000 mg | ORAL_TABLET | Freq: Every day | ORAL | Status: DC

## 2015-04-04 MED ORDER — SODIUM CHLORIDE 0.9 % IV SOLN
INTRAVENOUS | Status: AC | PRN
  Administered 2015-04-04: 10 mL/h via INTRAVENOUS

## 2015-04-04 MED ORDER — FENTANYL CITRATE (PF) 100 MCG/2ML IJ SOLN
INTRAMUSCULAR | Status: AC | PRN
  Administered 2015-04-04: 50 ug via INTRAVENOUS

## 2015-04-04 NOTE — Sedation Documentation (Signed)
Patient denies pain and is resting comfortably.  

## 2015-04-04 NOTE — Procedures (Signed)
Interventional Radiology Procedure Note  Procedure: US guided biopsy of neck lymph node.  Complications: None Recommendations:  - f/u biopsies - Routine wound care   Signed,  [name]. [name], DO

## 2015-04-04 NOTE — Sedation Documentation (Signed)
Patient is resting comfortably. 

## 2015-04-04 NOTE — Progress Notes (Signed)
Patient seen and examined. Case d/w residents in detail. I agree withfindings and plan as documented in Dr. Elon Jester note.  Patient still with complaints of cough. Still with recurrent fevers. Repeat CXR with no evidence of consolidation. Will c/w IV vanco for coag neg staph bacteremia. She will need TEE in AM to rule out endocarditis.   Patient also scheduled for biopsy of LN today for diffuse lymphadenopathy. Will f/u path to determine etiology- possible lymphoma.

## 2015-04-04 NOTE — Consult Note (Addendum)
PHARMACY NOTE  Consult :  Coumadin with Lovenox bridging  Indication :  History of DVT  Lovenox Dosing Wt :  72 kg  LABS :  Recent Labs  04/02/15 0711 04/02/15 0837 04/03/15 0221 04/04/15 0355  HGB 8.6*  --  10.3*  --   HCT 28.1*  --  32.8*  --   PLT 167  --  203  --   LABPROT  --  21.3* 21.8* 19.1*  INR  --  1.85* 1.91* 1.61*  CREATININE 7.31*  --  4.94*  --     MEDICATION: Medication PTA: Prescriptions prior to admission  Medication Sig Dispense Refill Last Dose  . albuterol (PROVENTIL HFA;VENTOLIN HFA) 108 (90 BASE) MCG/ACT inhaler Inhale 2 puffs into the lungs every 4 (four) hours as needed for wheezing or shortness of breath (as per home regimen). 1 Inhaler 0 03/30/2015 at Unknown time  . amLODipine (NORVASC) 10 MG tablet Take 10 mg by mouth at bedtime.   11 03/29/2015 at Unknown time  . aspirin 325 MG tablet Take 1 tablet (325 mg total) by mouth daily. 30 tablet 0 03/30/2015 at Unknown time  . calcium acetate (PHOSLO) 667 MG capsule Take 1 capsule (667 mg total) by mouth 2 (two) times daily. (Patient taking differently: Take 2,001 mg by mouth 3 (three) times daily with meals. ) 30 capsule 0 03/30/2015 at Unknown time  . clonazePAM (KLONOPIN) 0.5 MG tablet Take 1 mg by mouth at bedtime.    03/29/2015 at Unknown time  . Darbepoetin Alfa (ARANESP) 40 MCG/0.4ML SOSY injection Inject 0.4 mLs (40 mcg total) into the vein every Thursday with hemodialysis. 8.4 mL  Past Week at Unknown time  . doxercalciferol (HECTOROL) 4 MCG/2ML injection Inject 1 mL (2 mcg total) into the vein Every Tuesday,Thursday,and Saturday with dialysis. 2 mL  Past Week at Unknown time  . gabapentin (NEURONTIN) 100 MG capsule Take 100 mg by mouth daily.    03/30/2015 at Unknown time  . guaiFENesin (MUCINEX) 600 MG 12 hr tablet Take 1 tablet (600 mg total) by mouth 2 (two) times daily. 30 tablet 0 03/30/2015 at Unknown time  . insulin glargine (LANTUS) 100 UNIT/ML injection Inject 0.07  mLs (7 Units total) into the skin 2 (two) times daily. 10 mL 11 03/30/2015 at Unknown time  . insulin lispro (HUMALOG KWIKPEN) 100 UNIT/ML KiwkPen Inject 6 Units into the skin 2 (two) times daily.    03/30/2015 at Unknown time  . lidocaine (LIDODERM) 5 % Place 1 patch onto the skin daily. Remove & Discard patch within 12 hours or as directed by MD 10 patch 0 03/30/2015 at Unknown time  . metoCLOPramide (REGLAN) 10 MG tablet Take 1 tablet (10 mg total) by mouth 4 (four) times daily -  before meals and at bedtime. 60 tablet 0 03/30/2015 at Unknown time  . metoprolol succinate (TOPROL-XL) 50 MG 24 hr tablet Take 1 tablet (50 mg total) by mouth daily. Take with or immediately following a meal. 30 tablet 1 03/29/2015 at 1800  . montelukast (SINGULAIR) 10 MG tablet Take 10 mg by mouth at bedtime.   03/29/2015 at Unknown time  . multivitamin (RENA-VIT) TABS tablet Take 1 tablet by mouth daily. 30 tablet 0 03/30/2015 at Unknown time  . Nutritional Supplements (FEEDING SUPPLEMENT, NEPRO CARB STEADY,) LIQD Take 237 mLs by mouth 2 (two) times daily between meals. 60 Can 0 03/30/2015 at Unknown time  . Oxycodone HCl 10 MG TABS Take 10 mg by mouth 3 (three) times  daily as needed (for pain).   0 03/30/2015 at Unknown time  . oxyCODONE-acetaminophen (PERCOCET/ROXICET) 5-325 MG tablet Take 1-2 tablets by mouth every 6 (six) hours as needed for severe pain. 30 tablet 0 03/30/2015 at Unknown time  . pantoprazole (PROTONIX) 40 MG tablet Take 1 tablet (40 mg total) by mouth at bedtime. (Patient taking differently: Take 40 mg by mouth daily. Takes daily in the afternoon) 30 tablet 0 03/30/2015 at Unknown time  . promethazine (PHENERGAN) 25 MG tablet Take 1 tablet (25 mg total) by mouth every 6 (six) hours as needed for nausea or vomiting. 10 tablet 0 Past Month at Unknown time  . simvastatin (ZOCOR) 20 MG tablet Take 1 tablet (20 mg total) by mouth at bedtime. 30 tablet 0 03/29/2015 at Unknown time  . warfarin (COUMADIN) 5 MG tablet  Take 0.5 tablets (2.5 mg total) by mouth daily at 6 PM. (Patient taking differently: Take 5-7.5 mg by mouth See admin instructions. Take 1 tablet on Tuesday, Thursday and Saturday then take 1 and 1/2 tablets on Monday, Wednesday, Friday and Sunday) 60 tablet 0 03/30/2015 at Unknown time  . zolpidem (AMBIEN) 5 MG tablet Take 5 mg by mouth at bedtime as needed. sleep   03/29/2015 at Unknown time   Scheduled:  Scheduled:  . calcium acetate  667 mg Oral BID WC  . clonazePAM  1 mg Oral QHS  . [START ON 04/07/2015] darbepoetin (ARANESP) injection - DIALYSIS  200 mcg Intravenous Q Thu-HD  . dextromethorphan-guaiFENesin  1 tablet Oral BID  . dextrose  1 ampule Intravenous Once  . doxercalciferol  1 mcg Intravenous Q T,Th,Sa-HD  . feeding supplement (NEPRO CARB STEADY)  237 mL Oral BID BM  . fentaNYL      . ferric gluconate (FERRLECIT/NULECIT) IV  125 mg Intravenous Q T,Th,Sa-HD  . gabapentin  100 mg Oral Daily  . guaiFENesin  600 mg Oral BID  . insulin aspart  0-9 Units Subcutaneous TID WC  . insulin glargine  2 Units Subcutaneous BID  . lidocaine  1 patch Transdermal Q24H  . lidocaine (PF)      . metoCLOPramide  10 mg Oral TID AC & HS  . midazolam      . montelukast  10 mg Oral QHS  . multivitamin  1 tablet Oral Daily  . pantoprazole  40 mg Oral QHS  . simvastatin  20 mg Oral QHS  . [START ON 04/05/2015] vancomycin  750 mg Intravenous Q T,Th,Sa-HD   ASSESSMENT :  44 y.o. female is s/p lymph node biopsy who is to be restarted on Coumadin with Lovenox bridging for a history of DVT.    Today's INR 1.61.   INR  Is sub-therapeutic.  Lovenox bridging to be restarted until INR therapeutic.  No evidence of bleeding complications observed.  GOAL :  TARGET INR 2-3   Lovenox - Anti-Xa level 0.6-1 units/ml 4hrs after LMWH dose given  PLAN : 1. Restart Lovenox 70 mg sq q 24 hours [1mg /kg/q24hours]. 2. Coumadin 7.5 mg x 1 tonight. 3. Daily INR's, CBC. Monitor for bleeding complications.    Follow Platelet counts.  Marthenia Rolling,  Pharm.D   04/04/2015,  7:50 PM   ADDENDUM:   Attending Physician has discontinued the Lovenox bridging. DC Lovenox 70 mg sq q 24 hours.  Marthenia Rolling,  Pharm.D   04/04/2015,  8:04 PM

## 2015-04-04 NOTE — Progress Notes (Signed)
CRITICAL VALUE ALERT  Critical value received:  Gram positive cocci in clusters blood cultures  Date of notification:  04/04/2015  Time of notification:  10:40 AM  Critical value read back:Yes.    Nurse who received alert:  Penni Bombard, RN  MD notified (1st page):  Dr. Marlowe Sax  Time of first page:  10:40 AM  Responding MD: Dr. Marlowe Sax    Time MD responded:  10:50

## 2015-04-04 NOTE — Progress Notes (Signed)
    [facility] has been requested to perform a transesophageal echocardiogram on [name]  for bacteremia.  After careful review of history and examination, the risks and benefits of transesophageal echocardiogram have been explained including risks of esophageal damage, perforation (1:10,000 risk), bleeding, pharyngeal hematoma as well as other potential complications associated with conscious sedation including aspiration, arrhythmia, respiratory failure and death. Alternatives to treatment were discussed, questions were answered. Patient is willing to proceed.   BP has been 99/51 - 127/56 in the past 24 hours. Hgb at 10.3 and Platelets at 203 on 04/03/2015.  Erma Heritage, PA-C 04/04/2015 1:19 PM

## 2015-04-04 NOTE — Progress Notes (Signed)
[facility] Progress Note  Assessment/Plan: 1. R chest and neck pain - soft tissue mass R chest and lymphadenopathy by CT. For LN biopsy by IR tomorrow 2. MRSE bacteremia 11/4 2/2 + on Vanc - repeat cultures 11/6 1/2 now show gram + cocci - with peristant fevers- Vanc 1.5 given 11/5 and 750 ordered for tomorrow after HD- will draw another set on HD 11/8 consider Echo - also check Vanc level pre HD  3. ESRD TTS - next HD tomorrow 4. HTN cont amlod/ MTP; UF 2.5 on Saturday- with post weight 67.2 5. Anemia max ESA Hgb 10.3 - follow 6. MBD cont hect, binders 7. Chronic coumadin for DVT - [facility] managing as OP 8. DM per primary - BS labile 9. Vol stable, just over dry  [name] Jacobson, PA-C [facility] Beeper [phone] 04/04/2015,10:19 AM  LOS: 4 days   Subjective:   C/o being hot, area over right supraclavicular swelling hurts Objective Filed Vitals:   04/03/15 2020 04/04/15 0420 04/04/15 0500 04/04/15 0827  BP: 127/51 116/56  113/51  Pulse: 109 103  97  Temp: 98.3 F (36.8 C) 98.1 F (36.7 C)  102.1 F (38.9 C)  TempSrc: Oral Oral  Oral  Resp: 18 19  18   Height:      Weight: 68.1 kg (150 lb 2.1 oz)  71.668 kg (158 lb)   SpO2: 95% 97%  91%   Physical Exam General: NAD but very sweaty, dry coughing with deep inspirations Heart: tachy Lungs: soft wheezes Abdomen: soft  Extremities: no LE edema Dialysis Access: right and left thigh grafts patent - using right - left placed for back up   Dialysis Orders: [facility]: [facility] 3.75 hr 400/600 2 K 2.5 Ca right thigh AVGG left thigh AVGG Hectorol 1 venofer 100 x 2 then 50 per week, Mircera 225 - started 11/3 - last got 200 10/6   Additional Objective Labs: Basic Metabolic Panel:  Recent Labs Lab 04/01/15 0419 04/02/15 0711 04/03/15 0221  NA 135 128* 132*  K 3.8 4.5 4.9  CL 97* 94* 94*  CO2 23 22 24   GLUCOSE 256* 440* 50*  BUN 28* 47* 22*  CREATININE 4.64* 7.31* 4.94*  CALCIUM 8.5* 8.2* 8.8*   PHOS  --   --  2.5   Liver Function Tests:  Recent Labs Lab 03/30/15 1944 03/31/15 1555 04/03/15 0221  AST 15 32  --   ALT 10* 15  --   ALKPHOS 145* 174*  --   BILITOT 0.7 0.8  --   PROT 7.7 7.7  --   ALBUMIN 2.8* 2.8* 2.8*  CBC:  Recent Labs Lab 03/30/15 1944 04/01/15 0419 04/02/15 0711 04/02/15 0839 04/03/15 0221  WBC 6.5 7.5 8.0  --  9.5  NEUTROABS 4.9  --   --  5.8 6.4  HGB 8.9* 8.4* 8.6*  --  10.3*  HCT 29.9* 27.3* 28.1*  --  32.8*  MCV 94.9 92.9 93.4  --  94.3  PLT 159 160 167  --  203   Blood Culture    Component Value Date/Time   SDES BLOOD LEFT HAND 04/01/2015 1956   SPECREQUEST IN PEDIATRIC BOTTLE 2CC 04/01/2015 1956   CULT  04/01/2015 1956    STAPHYLOCOCCUS SPECIES (COAGULASE NEGATIVE) SUSCEPTIBILITIES PERFORMED ON PREVIOUS CULTURE WITHIN THE LAST 5 DAYS.    REPTSTATUS 04/04/2015 FINAL 04/01/2015 1956   CBG:  Recent Labs Lab 04/03/15 0726 04/03/15 1140 04/03/15 1633 04/03/15 2014 04/04/15 0730  GLUCAP 88 123*  159* 160* 117*    Studies/Results: Dg Chest 2 View  04/04/2015  CLINICAL DATA:  Cough, weakness, and fever for a few days. EXAM: CHEST  2 VIEW COMPARISON:  04/02/2015 FINDINGS: The cardiac silhouette is mildly enlarged, unchanged. There is persistent mild elevation of the right hemidiaphragm. The patient has taken a greater inspiration than on the prior study. There is slightly improved aeration of the lung bases with persistent mild basilar opacities and mild interstitial prominence bilaterally. No pneumothorax or pleural effusion is seen. Right upper quadrant abdominal surgical clips are noted. No acute osseous abnormality is identified. IMPRESSION: Greater inspiration with improved aeration of the lung bases. Residual mild bibasilar opacities may reflect atelectasis, mild interstitial edema, or infection. Electronically Signed   By: [name] M.D.   On: 04/04/2015 09:26   Dg Chest 2 View  04/02/2015  CLINICAL DATA:  44 year old female  with a history of chest pain EXAM: CHEST - 2 VIEW COMPARISON:  CT 03/31/2015, chest x-ray 01/05/2015 FINDINGS: Cardiomediastinal silhouette unchanged. Low lung volumes with interstitial opacities bilaterally and patchy airspace opacities. No pneumothorax.  No pleural effusion. No displaced fracture. Unremarkable appearance of the upper abdomen. IMPRESSION: Interstitial opacities and patchy airspace opacities, potentially reflecting developing edema, consolidation, or atelectasis given the low lung volumes. Signed, [name]. [name] Newport, DO [facility] Electronically Signed   By: Corrie Mckusick D.O.   On: 04/02/2015 14:49   Medications:   . calcium acetate  667 mg Oral BID WC  . clonazePAM  1 mg Oral QHS  . [START ON 04/07/2015] darbepoetin (ARANESP) injection - DIALYSIS  200 mcg Intravenous Q Thu-HD  . dextromethorphan-guaiFENesin  1 tablet Oral BID  . doxercalciferol  1 mcg Intravenous Q T,Th,Sa-HD  . enoxaparin (LOVENOX) injection  1 mg/kg Subcutaneous Q24H  . feeding supplement (NEPRO CARB STEADY)  237 mL Oral BID BM  . ferric gluconate (FERRLECIT/NULECIT) IV  125 mg Intravenous Q T,Th,Sa-HD  . gabapentin  100 mg Oral Daily  . guaiFENesin  600 mg Oral BID  . insulin aspart  0-9 Units Subcutaneous TID WC  . insulin glargine  4 Units Subcutaneous BID  . lidocaine  1 patch Transdermal Q24H  . metoCLOPramide  10 mg Oral TID AC & HS  . montelukast  10 mg Oral QHS  . multivitamin  1 tablet Oral Daily  . pantoprazole  40 mg Oral QHS  . simvastatin  20 mg Oral QHS  . [START ON 04/05/2015] vancomycin  750 mg Intravenous Q T,Th,Sa-HD

## 2015-04-04 NOTE — Progress Notes (Addendum)
Subjective: Currently 2/2 bcx growing GPC in cluster. Temperature this morning 102.1. Patient is complaining of a cough and chest congestion. Also complaining of some pain on the right side of her neck where she has swollen lymph nodes. Denies any chest pain/sob.n/v. No other complaints.   Objective: Vital signs in last 24 hours: Filed Vitals:   04/03/15 2020 04/04/15 0420 04/04/15 0500 04/04/15 0827  BP: 127/51 116/56  113/51  Pulse: 109 103  97  Temp: 98.3 F (36.8 C) 98.1 F (36.7 C)  102.1 F (38.9 C)  TempSrc: Oral Oral  Oral  Resp: 18 19  18   Height:      Weight: 150 lb 2.1 oz (68.1 kg)  158 lb (71.668 kg)   SpO2: 95% 97%  91%   Weight change: 1 lb 15.7 oz (0.9 kg)  Intake/Output Summary (Last 24 hours) at 04/04/15 0840 Last data filed at 04/04/15 4801  Gross per 24 hour  Intake    360 ml  Output      0 ml  Net    360 ml   Physical Exam: Gen: AAOx3, NAD HEENT: bilateral enlarged supraclavicular LN, tender to palpation on right side CVS: RRR, S1 and S2 appreciated, systolic murmur   Lungs: LLL crackles crackles Abd: soft, NT, ND.  Ext: no edema Skin: warm and dry    Lab Results: Basic Metabolic Panel:  Recent Labs Lab 04/02/15 0711 04/03/15 0221  NA 128* 132*  K 4.5 4.9  CL 94* 94*  CO2 22 24  GLUCOSE 440* 50*  BUN 47* 22*  CREATININE 7.31* 4.94*  CALCIUM 8.2* 8.8*  PHOS  --  2.5   Liver Function Tests:  Recent Labs Lab 03/30/15 1944 03/31/15 1555 04/03/15 0221  AST 15 32  --   ALT 10* 15  --   ALKPHOS 145* 174*  --   BILITOT 0.7 0.8  --   PROT 7.7 7.7  --   ALBUMIN 2.8* 2.8* 2.8*   CBC:  Recent Labs Lab 04/02/15 0711 04/02/15 0839 04/03/15 0221  WBC 8.0  --  9.5  NEUTROABS  --  5.8 6.4  HGB 8.6*  --  10.3*  HCT 28.1*  --  32.8*  MCV 93.4  --  94.3  PLT 167  --  203   CBG:  Recent Labs Lab 04/03/15 0453 04/03/15 0726 04/03/15 1140 04/03/15 1633 04/03/15 2014 04/04/15 0730  GLUCAP 113* 88 123* 159* 160* 117*    Hemoglobin A1C:  Recent Labs Lab 03/31/15 1912  HGBA1C 10.0*   Coagulation:  Recent Labs Lab 03/31/15 0746 04/02/15 0837 04/03/15 0221 04/04/15 0355  LABPROT 19.5* 21.3* 21.8* 19.1*  INR 1.64* 1.85* 1.91* 1.61*    Micro Results: Recent Results (from the past 240 hour(s))  Culture, blood (routine x 2)     Status: None (Preliminary result)   Collection Time: 04/01/15  7:50 PM  Result Value Ref Range Status   Specimen Description BLOOD RIGHT ARM  Final   Special Requests IN PEDIATRIC BOTTLE 1CC  Final   Culture  Setup Time   Final    GRAM POSITIVE COCCI IN CLUSTERS AEROBIC BOTTLE ONLY CRITICAL RESULT CALLED TO, READ BACK BY AND VERIFIED WITH: A RIO 04/02/15 @ 28 M VESTAL    Culture GRAM POSITIVE COCCI  Final   Report Status PENDING  Incomplete  Culture, blood (routine x 2)     Status: None (Preliminary result)   Collection Time: 04/01/15  7:56 PM  Result Value Ref  Range Status   Specimen Description BLOOD LEFT HAND  Final   Special Requests IN PEDIATRIC BOTTLE 2CC  Final   Culture  Setup Time   Final    GRAM POSITIVE COCCI IN CLUSTERS AEROBIC BOTTLE ONLY CRITICAL RESULT CALLED TO, READ BACK BY AND VERIFIED WITH: A RIO 04/02/15 @ 83 M VESTAL    Culture GRAM POSITIVE COCCI  Final   Report Status PENDING  Incomplete   Studies/Results: Dg Chest 2 View  04/02/2015  CLINICAL DATA:  44 year old female with a history of chest pain EXAM: CHEST - 2 VIEW COMPARISON:  CT 03/31/2015, chest x-ray 01/05/2015 FINDINGS: Cardiomediastinal silhouette unchanged. Low lung volumes with interstitial opacities bilaterally and patchy airspace opacities. No pneumothorax.  No pleural effusion. No displaced fracture. Unremarkable appearance of the upper abdomen. IMPRESSION: Interstitial opacities and patchy airspace opacities, potentially reflecting developing edema, consolidation, or atelectasis given the low lung volumes. Signed, [name]. [name], DO [facility] Electronically Signed   By: Corrie Mckusick D.O.   On: 04/02/2015 14:49   Medications: I have reviewed the patient's current medications. Scheduled Meds: . calcium acetate  667 mg Oral BID WC  . clonazePAM  1 mg Oral QHS  . [START ON 04/07/2015] darbepoetin (ARANESP) injection - DIALYSIS  200 mcg Intravenous Q Thu-HD  . dextromethorphan-guaiFENesin  1 tablet Oral BID  . doxercalciferol  1 mcg Intravenous Q T,Th,Sa-HD  . enoxaparin (LOVENOX) injection  1 mg/kg Subcutaneous Q24H  . feeding supplement (NEPRO CARB STEADY)  237 mL Oral BID BM  . ferric gluconate (FERRLECIT/NULECIT) IV  125 mg Intravenous Q T,Th,Sa-HD  . gabapentin  100 mg Oral Daily  . guaiFENesin  600 mg Oral BID  . insulin aspart  0-9 Units Subcutaneous TID WC  . insulin glargine  4 Units Subcutaneous BID  . lidocaine  1 patch Transdermal Q24H  . metoCLOPramide  10 mg Oral TID AC & HS  . montelukast  10 mg Oral QHS  . multivitamin  1 tablet Oral Daily  . pantoprazole  40 mg Oral QHS  . sertraline  50 mg Oral Daily  . simvastatin  20 mg Oral QHS  . 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 Also got CT abdomen which shows small ascites, some cirrhotic changes of liver, diffuse stomach wall thickening concerning for gastritis vs neoplastic involvement (gastric carcinoma vs lymphoma?). HIV and LDH neg.  -  appreciate IR help, plan for IR biopsy today.  - Oxycodone 5 mg q6 prn  - oncology f/up after tissue biopsy.   Fever temperature 102.1 this am and pulse 103. 2/2 BCX growing GPC in cluster concerning for Staph. Vancomycin started on 11/5. Patient was also coughing and lung exam remarkable for LLL crackles.  - cont vancomycin  - CXR to r/o possible pneumonia  - TTE to evaluate for vegetations - f/up repeat BCX results - Continue Incentive spirometer  Addendum: Spoke to Water engineer. TEE to look for possible vegetations scheduled for tomorrow.  -  Mucinex Dm for congestion  Severe hyperglycemia with DM Type I - did not receive insulin over 24 hours before we admitted her. Glucose was as high as 1000, without acidosis or anion gap. She was on insulin drip, and has been transitioned to lantus 7 untis BID with SSI and meal time insulin. She now became more hypoglycemic in 80's. D/ced meal time insulin today and reduced lantus to 4 units BID. CBG 117 this am. - Continue Lantus 4 u BID - sensitive-SSI  Pseudohyponatremia - monitor with insulin regmine.  H/o LLE DVT (01/19/2015):  holding home coumadin in anticipation of biopsy, bridge with lovenox for now.   HFpEF: Stable. TTE in 03/2014 with LVEF 60% and mild LVH. AV sclerosis w/o stenosis.  -Monitor   ESRD on HD (TTS): New left AVG in 12/2014 - appreciate nephro recs.   HTN: BP 116/56. Continue holding home Amlodipine 10 mg qd, Metoprolol 50 mg qd for now.   GERD: Protonix 40 mg qd    Code: Full  Dispo: Disposition is deferred at this time, awaiting improvement of current medical problems.  Anticipated discharge in approximately 0 day(s).   The patient does have a current PCP [name], MD) and does need an [facility] hospital follow-up appointment after discharge.  The patient does not have transportation limitations that hinder transportation to clinic appointments.  .Services Needed at time of discharge: Y = Yes, Blank =  No PT:   OT:   RN:   Equipment:   Other:     LOS: 4 days   Shela Leff, MD 04/04/2015, 8:40 AM

## 2015-04-04 NOTE — Progress Notes (Signed)
Subjective:   Hospital day 4  Interval history: Patient was going to be discharged on Friday 11/4 after CT abd/pelvis but was kept overnight after she developed a fever to 102.43F.  She subsequently had a decrease in O2 sats to the low 90s on room air and lung auscultation suggestive of pneumonia.  CXR showed findings suggestive of infiltrate vs. pulmonary edema or atelectasis.  Started on vanc 750mg  administered during T/Th/Sat HD for gram positive cocci in clusters on blood culture, now 2/2, awaiting speciation and sensitivities.  Patient was noted to be hypoglycemic to 50 yesterday, so Lantus was decreased from 7 to 4 units BID on 11/6 and patient is still on novolog TID with meals.  Anticoagulation with warfarin and lovenox has been held since 11/4 in anticipation of lymph node biopsy today, 11/7.  Overnight: Patient complaining mostly of cough but says she is not having any SOB, even when ambulating.  Her nurse seems to think she did have some SOB with walking. Other than this, patient has no complaints.  Specifically denies calf pain, abdominal pain or chest pain.  Patient does not have any nausea/vomiting.  Does endorse feeling generally fatigued and says its "hard for her to sit up in the mornings."  I did not specifically continue probing mood as [name] had just woken up and seemed to be dozing off during the physical exam portion of this interview.  Objective: Vital signs in last 24 hours: Filed Vitals:   04/03/15 2020 04/04/15 0420 04/04/15 0500 04/04/15 0827  BP: 127/51 116/56  113/51  Pulse: 109 103  97  Temp: 98.3 F (36.8 C) 98.1 F (36.7 C)  102.1 F (38.9 C)  TempSrc: Oral Oral  Oral  Resp: 18 19  18   Height:      Weight: 68.1 kg (150 lb 2.1 oz)  71.668 kg (158 lb)   SpO2: 95% 97%  91%   Weight change: 0.9 kg (1 lb 15.7 oz)  Filed Weights   04/02/15 2008 04/03/15 2020 04/04/15 0500  Weight: 67.4 kg (148 lb 9.4 oz) 68.1 kg (150 lb 2.1 oz) 71.668 kg (158 lb)     Intake/Output Summary (Last 24 hours) at 04/04/15 0842 Last data filed at 04/04/15 1607  Gross per 24 hour  Intake    360 ml  Output      0 ml  Net    360 ml   General: African-American femal who appears older than age (44), asleep in bed but rouses to voice.  Had some difficulty sitting up for pulmonary exam and used bed rail to support self.  Appears tired. 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  Regular rate and rhythm. Pulmonary: Normal work of breathing, diffuse rhonchi heard throughout lung fields, worst in LLL Abdomen: Soft, nontender MSK: No calf tenderness, swelling or warmth Psych: Patient is alert.  Affect is blunted.  Patient is polite and speaks only in few word sentences in response to specific questioning. On reassessment, patient denies difficulty with sleeping at home, though does have some low energy.  She does not feel guilty and does not have problems with appetite.  Denies SI/HI. Denies feelings of hopelessness.   Lab Results: CMP Latest Ref Rng 04/03/2015 04/02/2015 04/01/2015  Glucose 65 - 99 mg/dL 50(L) 440(H) 256(H)  BUN 6 - 20 mg/dL  22(H) 47(H) 28(H)  Creatinine 0.44 - 1.00 mg/dL 4.94(H) 7.31(H) 4.64(H)  Sodium 135 - 145 mmol/L 132(L) 128(L) 135  Potassium 3.5 - 5.1 mmol/L 4.9 4.5 3.8  Chloride 101 - 111 mmol/L 94(L) 94(L) 97(L)  CO2 22 - 32 mmol/L 24 22 23   Calcium 8.9 - 10.3 mg/dL 8.8(L) 8.2(L) 8.5(L)  Total Protein 6.5 - 8.1 g/dL - - -  Total Bilirubin 0.3 - 1.2 mg/dL - - -  Alkaline Phos 38 - 126 U/L - - -  AST 15 - 41 U/L - - -  ALT 14 - 54 U/L - - -   CBC Latest Ref Rng 04/03/2015 04/02/2015 04/01/2015  WBC 4.0 - 10.5 K/uL 9.5 8.0 7.5  Hemoglobin 12.0 - 15.0 g/dL 10.3(L) 8.6(L) 8.4(L)  Hematocrit 36.0 - 46.0 % 32.8(L) 28.1(L)  27.3(L)  Platelets 150 - 400 K/uL 203 167 160    Micro Results: Recent Results (from the past 240 hour(s))  Culture, blood (routine x 2)     Status: None (Preliminary result)   Collection Time: 04/01/15  7:50 PM  Result Value Ref Range Status   Specimen Description BLOOD RIGHT ARM  Final   Special Requests IN PEDIATRIC BOTTLE 1CC  Final   Culture  Setup Time   Final    GRAM POSITIVE COCCI IN CLUSTERS AEROBIC BOTTLE ONLY CRITICAL RESULT CALLED TO, READ BACK BY AND VERIFIED WITH: A RIO 04/02/15 @ 44 M VESTAL    Culture GRAM POSITIVE COCCI  Final   Report Status PENDING  Incomplete  Culture, blood (routine x 2)     Status: None (Preliminary result)   Collection Time: 04/01/15  7:56 PM  Result Value Ref Range Status   Specimen Description BLOOD LEFT HAND  Final   Special Requests IN PEDIATRIC BOTTLE 2CC  Final   Culture  Setup Time   Final    GRAM POSITIVE COCCI IN CLUSTERS AEROBIC BOTTLE ONLY CRITICAL RESULT CALLED TO, READ BACK BY AND VERIFIED WITH: A RIO 04/02/15 @ 65 M VESTAL    Culture GRAM POSITIVE COCCI  Final   Report Status PENDING  Incomplete   Studies/Results: Dg Chest 2 View  04/02/2015  CLINICAL DATA:  44 year old female with a history of chest pain EXAM: CHEST - 2 VIEW COMPARISON:  CT 03/31/2015, chest x-ray 01/05/2015 FINDINGS: Cardiomediastinal silhouette unchanged. Low lung volumes with interstitial opacities bilaterally and patchy airspace opacities. No pneumothorax.  No pleural effusion. No displaced fracture. Unremarkable appearance of the upper abdomen. IMPRESSION: Interstitial opacities and patchy airspace opacities, potentially reflecting developing edema, consolidation, or atelectasis given the low lung volumes. Signed, [name]. [name], DO [facility] Electronically Signed   By: Corrie Mckusick D.O.   On: 04/02/2015 14:49   Medications: I have reviewed the patient's current medications. Scheduled  Meds: . calcium acetate  667 mg Oral BID WC  . clonazePAM  1 mg Oral QHS  . [START ON 04/07/2015] darbepoetin (ARANESP) injection - DIALYSIS  200 mcg Intravenous Q Thu-HD  . dextromethorphan-guaiFENesin  1 tablet Oral BID  . doxercalciferol  1 mcg Intravenous Q T,Th,Sa-HD  . enoxaparin (LOVENOX) injection  1 mg/kg Subcutaneous Q24H  . feeding supplement (NEPRO CARB STEADY)  237 mL Oral BID BM  . ferric gluconate (FERRLECIT/NULECIT) IV  125 mg Intravenous Q T,Th,Sa-HD  . gabapentin  100 mg Oral Daily  . guaiFENesin  600 mg Oral BID  . insulin aspart  0-9 Units Subcutaneous TID WC  . insulin glargine  4 Units Subcutaneous BID  . lidocaine  1 patch Transdermal Q24H  . metoCLOPramide  10 mg Oral TID AC & HS  . montelukast  10 mg Oral QHS  . multivitamin  1 tablet Oral Daily  . pantoprazole  40 mg Oral QHS  . sertraline  50 mg Oral Daily  . simvastatin  20 mg Oral QHS  . 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[place])   End-stage renal disease on hemodialysis ([place])   Chronic diastolic heart failure ([place])   History of DVT of lower extremity  DEMETRESS TIFT is a 44 y/o female with PMH significant for Type 1 DM since childhood with ESRD s/p 2013 renal/pancreatic transplant with failure in 2014 now on T/Th/Sat HD and multiple previous admission for DKA (4 times in 2016, Mar/Jun/July/Aug), admitted for work-up of bilateral supraclavicular lymphadenopathy suspicious for lymphoma vs metastasis, now with lung findings worrisome with pneumonia and 2/2 blood cultures positive for GPC in clusters, on vanc.   Plan for lymph node biopsy  today, 11/7.  Of note, patient had very high blood glucose on admission (>1000) but no lab evidence of anion gap or acidosis, with significant blood improvement after insulin drip, but has since been hypoglycemic on home regimen (now modified).  #Lymphadenopathy suspected 2/2 lymphoma vs mets: bilateral supraclavicular node lymphadenopathy and CT showing mediastinal lymphadenopathy with spiculated mass in right pectoralis major  -Inpatient biopsy to be performed 11/7 -Holding warfarin since 11/4 in anticipation of biopsy -CT abd/pelvis obtained evening of 11/4 showed stomach wall thickening suggestive of possible diffuse neoplastic process  #Bacteremia with GPC: in clusters on 2/2 blood cultures -continue vanc 750mg  during T/Th/Sat HD -follow up speciation and sensitivities 11/7 -repeat cultures pendings  #SOB: evidence of pulmonary congestion on chest imaging 11/5, unclear underlying etiology, r/o infection given new fever evening of 11/4.  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 Outpatient EGD given CT findings of possible neoplastic process involving GI tract  #T1DM: >1000 blood glucose on admission improved after insulin drip, but patient hypoglycemic to 50 on home regimen -Lantus 7 Units BID decreased to 4 units BID on 11/6 -Continue novolog TID with meals  -Monitor fingerstick and CBG  #ESRD on T/Th/Sat HD: -calcium acetate 667mg  BID with  meals -doxercalciferol 70mcg with HD -vanc with HD as above  #Pain: -Tylenol 650mg  q6 PRN -5mg  oxycodone increased from q8 to q6 PRN 11/7  #Blunted affect, in context of suspected cancer dx, however pt denies SIGEAPS symptoms outside the hospital -Recommend PCP reassessment of mood at f/u  -Would consider starting 25mg  sertraline x1 week and increasing to 50mg  if tolerated  #Anemia of chronic disease -214mcg Darbopoetin Alfa every Thursday during HD to  stimulate bone marrow   #Prior DVT -Holding anticoagulation with warfarin since 11/4 in anticipation of biopsy -Last Lovenox 11/6 -Will need to resume home warfarin after biopsy  #Sleep: pt on 1mg  clonzaepam at home daily at bedtime -Continue home dose to avoid benzo withdrawal   #HTN -hold amlodipine and metoprolol until after procedure  #HLD -continue simvastatin 20mg   #Dispo: deferred at this time -Will need outpatient follow up with heme/onc and PCP  This is a Careers information officer Note.  The care of the patient was discussed with Dr. Berline Lopes and the assessment and plan formulated with their assistance.  Please see their attached note for official documentation of the daily encounter.   LOS: 4 days   Ladell Pier, Med Student 04/04/2015, 8:41 AM

## 2015-04-04 NOTE — Sedation Documentation (Signed)
Pt arrived from the floor awake and alert but falls asleep when not talked to. In no distress. Placed on CR monitor with SA02 81%. Woke pt up and asked to take deep breath with SA02 92%. Pt states she uses 02 at home at 3L [place]. Placed on 2L [place] with SA02 remaining 94%

## 2015-04-04 NOTE — Care Management Important Message (Signed)
Important Message  Patient Details  Name: [name] MRN: [id number] Date of Birth: Aug 17, 1970   Medicare Important Message Given:  Yes-second notification given    Loann Quill 04/04/2015, 1:47 PM

## 2015-04-04 NOTE — Progress Notes (Signed)
Hypoglycemic Event  CBG: 66  Treatment: 15 GM gel  Symptoms: shaky hungry  Follow-up CBG: Time:1332 CBG Result:72  Possible Reasons for Event: Inadequate meal intake  Comments/MD notified: Dr. Mady Haagensen A

## 2015-04-05 ENCOUNTER — Inpatient Hospital Stay (HOSPITAL_COMMUNITY): Payer: Medicare Other

## 2015-04-05 ENCOUNTER — Encounter (HOSPITAL_COMMUNITY)
Admission: EM | Disposition: A | Discharge status: Home / Self Care | Payer: Self-pay | Source: Home / Self Care | Attending: Internal Medicine

## 2015-04-05 DIAGNOSIS — B957 Other staphylococcus as the cause of diseases classified elsewhere: Secondary | ICD-10-CM

## 2015-04-05 DIAGNOSIS — Z9889 Other specified postprocedural states: Secondary | ICD-10-CM

## 2015-04-05 DIAGNOSIS — Z940 Kidney transplant status: Secondary | ICD-10-CM

## 2015-04-05 DIAGNOSIS — N186 End stage renal disease: Secondary | ICD-10-CM

## 2015-04-05 DIAGNOSIS — I340 Nonrheumatic mitral (valve) insufficiency: Secondary | ICD-10-CM

## 2015-04-05 DIAGNOSIS — R509 Fever, unspecified: Secondary | ICD-10-CM

## 2015-04-05 DIAGNOSIS — Z992 Dependence on renal dialysis: Secondary | ICD-10-CM

## 2015-04-05 DIAGNOSIS — R590 Localized enlarged lymph nodes: Principal | ICD-10-CM

## 2015-04-05 DIAGNOSIS — Z9483 Pancreas transplant status: Secondary | ICD-10-CM

## 2015-04-05 HISTORY — PX: TEE WITHOUT CARDIOVERSION: SHX5443

## 2015-04-05 LAB — RENAL FUNCTION PANEL
Albumin: 2.5 g/dL — ABNORMAL LOW (ref 3.5–5.0)
Anion gap: 16 — ABNORMAL HIGH (ref 5–15)
BUN: 49 mg/dL — ABNORMAL HIGH (ref 6–20)
CO2: 19 mmol/L — ABNORMAL LOW (ref 22–32)
Calcium: 8.2 mg/dL — ABNORMAL LOW (ref 8.9–10.3)
Chloride: 93 mmol/L — ABNORMAL LOW (ref 101–111)
Creatinine, Ser: 10.31 mg/dL — ABNORMAL HIGH (ref 0.44–1.00)
GFR calc Af Amer: 5 mL/min — ABNORMAL LOW (ref >60)
GFR calc non Af Amer: 4 mL/min — ABNORMAL LOW (ref >60)
Glucose, Bld: 254 mg/dL — ABNORMAL HIGH (ref 65–99)
Phosphorus: 4.8 mg/dL — ABNORMAL HIGH (ref 2.5–4.6)
Potassium: 5.1 mmol/L (ref 3.5–5.1)
Sodium: 128 mmol/L — ABNORMAL LOW (ref 135–145)

## 2015-04-05 LAB — CBC
HCT: 28.3 % — ABNORMAL LOW (ref 36.0–46.0)
HEMATOCRIT: 27.5 % — AB (ref 36.0–46.0)
HEMOGLOBIN: 8.3 g/dL — AB (ref 12.0–15.0)
Hemoglobin: 8.8 g/dL — ABNORMAL LOW (ref 12.0–15.0)
MCH: 28.3 pg (ref 26.0–34.0)
MCH: 29.6 pg (ref 26.0–34.0)
MCHC: 30.2 g/dL (ref 30.0–36.0)
MCHC: 31.1 g/dL (ref 30.0–36.0)
MCV: 93.9 fL (ref 78.0–100.0)
MCV: 95.3 fL (ref 78.0–100.0)
Platelets: 165 10*3/uL (ref 150–400)
Platelets: 164 10*3/uL (ref 150–400)
RBC: 2.93 MIL/uL — ABNORMAL LOW (ref 3.87–5.11)
RBC: 2.97 MIL/uL — ABNORMAL LOW (ref 3.87–5.11)
RDW: 17.3 % — ABNORMAL HIGH (ref 11.5–15.5)
RDW: 17.5 % — ABNORMAL HIGH (ref 11.5–15.5)
WBC: 7 10*3/uL (ref 4.0–10.5)
WBC: 6.8 10*3/uL (ref 4.0–10.5)

## 2015-04-05 LAB — GLUCOSE, CAPILLARY
GLUCOSE-CAPILLARY: 184 mg/dL — AB (ref 65–99)
Glucose-Capillary: 232 mg/dL — ABNORMAL HIGH (ref 65–99)
Glucose-Capillary: 313 mg/dL — ABNORMAL HIGH (ref 65–99)

## 2015-04-05 LAB — PROTIME-INR
INR: 1.51 — AB (ref 0.00–1.49)
Prothrombin Time: 18.3 seconds — ABNORMAL HIGH (ref 11.6–15.2)

## 2015-04-05 LAB — BASIC METABOLIC PANEL
ANION GAP: 13 (ref 5–15)
BUN: 42 mg/dL — ABNORMAL HIGH (ref 6–20)
CHLORIDE: 92 mmol/L — AB (ref 101–111)
CO2: 23 mmol/L (ref 22–32)
Calcium: 8.2 mg/dL — ABNORMAL LOW (ref 8.9–10.3)
Creatinine, Ser: 9.32 mg/dL — ABNORMAL HIGH (ref 0.44–1.00)
GFR calc non Af Amer: 5 mL/min — ABNORMAL LOW (ref >60)
GFR, EST AFRICAN AMERICAN: 5 mL/min — AB (ref >60)
Glucose, Bld: 238 mg/dL — ABNORMAL HIGH (ref 65–99)
POTASSIUM: 4.6 mmol/L (ref 3.5–5.1)
Sodium: 128 mmol/L — ABNORMAL LOW (ref 135–145)

## 2015-04-05 LAB — VANCOMYCIN, RANDOM: Vancomycin Rm: 22 ug/mL

## 2015-04-05 SURGERY — ECHOCARDIOGRAM, TRANSESOPHAGEAL
Anesthesia: Moderate Sedation

## 2015-04-05 MED ORDER — DOXERCALCIFEROL 4 MCG/2ML IV SOLN
INTRAVENOUS | Status: AC
  Filled 2015-04-05: qty 2

## 2015-04-05 MED ORDER — LIDOCAINE HCL (PF) 1 % IJ SOLN: 5.0000 mL | INTRAMUSCULAR | Status: DC | PRN

## 2015-04-05 MED ORDER — WARFARIN SODIUM 7.5 MG PO TABS
7.5000 mg | ORAL_TABLET | Freq: Once | ORAL | Status: AC
  Administered 2015-04-05: 7.5 mg via ORAL
  Filled 2015-04-05: qty 1

## 2015-04-05 MED ORDER — MIDAZOLAM HCL 10 MG/2ML IJ SOLN
INTRAMUSCULAR | Status: DC | PRN
  Administered 2015-04-05: 1 mg via INTRAVENOUS

## 2015-04-05 MED ORDER — PENTAFLUOROPROP-TETRAFLUOROETH EX AERO: 1.0000 "application " | INHALATION_SPRAY | CUTANEOUS | Status: DC | PRN

## 2015-04-05 MED ORDER — SODIUM CHLORIDE 0.9 % IV SOLN: 100.0000 mL | INTRAVENOUS | Status: DC | PRN

## 2015-04-05 MED ORDER — HEPARIN SODIUM (PORCINE) 1000 UNIT/ML DIALYSIS: 1000.0000 [IU] | INTRAMUSCULAR | Status: DC | PRN

## 2015-04-05 MED ORDER — LIDOCAINE-PRILOCAINE 2.5-2.5 % EX CREA
1.0000 "application " | TOPICAL_CREAM | CUTANEOUS | Status: DC | PRN
  Filled 2015-04-05: qty 5

## 2015-04-05 MED ORDER — ENOXAPARIN SODIUM 80 MG/0.8ML ~~LOC~~ SOLN
70.0000 mg | SUBCUTANEOUS | Status: DC
  Administered 2015-04-05: 70 mg via SUBCUTANEOUS
  Filled 2015-04-05 (×2): qty 0.8

## 2015-04-05 MED ORDER — SODIUM CHLORIDE 0.9 % IV SOLN: INTRAVENOUS | Status: DC

## 2015-04-05 MED ORDER — PROMETHAZINE HCL 25 MG/ML IJ SOLN: 6.2500 mg | Freq: Three times a day (TID) | INTRAMUSCULAR | Status: DC | PRN

## 2015-04-05 MED ORDER — DIPHENHYDRAMINE HCL 50 MG/ML IJ SOLN
INTRAMUSCULAR | Status: AC
  Filled 2015-04-05: qty 1

## 2015-04-05 MED ORDER — FENTANYL CITRATE (PF) 100 MCG/2ML IJ SOLN
INTRAMUSCULAR | Status: DC | PRN
  Administered 2015-04-05: 12.5 ug via INTRAVENOUS

## 2015-04-05 MED ORDER — MIDAZOLAM HCL 5 MG/ML IJ SOLN
INTRAMUSCULAR | Status: AC
  Filled 2015-04-05: qty 2

## 2015-04-05 MED ORDER — BUTAMBEN-TETRACAINE-BENZOCAINE 2-2-14 % EX AERO
INHALATION_SPRAY | CUTANEOUS | Status: DC | PRN
  Administered 2015-04-05: 2 via TOPICAL

## 2015-04-05 MED ORDER — FENTANYL CITRATE (PF) 100 MCG/2ML IJ SOLN
INTRAMUSCULAR | Status: AC
  Filled 2015-04-05: qty 2

## 2015-04-05 MED ORDER — ALTEPLASE 2 MG IJ SOLR
2.0000 mg | Freq: Once | INTRAMUSCULAR | Status: DC | PRN
  Filled 2015-04-05: qty 2

## 2015-04-05 MED ORDER — SODIUM CHLORIDE 0.9 % IV SOLN
INTRAVENOUS | Status: DC
  Administered 2015-04-05: 500 mL via INTRAVENOUS

## 2015-04-05 NOTE — Progress Notes (Signed)
Echocardiogram Echocardiogram Transesophageal has been performed.  Joelene Millin 04/05/2015, 9:15 AM

## 2015-04-05 NOTE — H&P (View-Only) (Signed)
    [facility] has been requested to perform a transesophageal echocardiogram on [name]  for bacteremia.  After careful review of history and examination, the risks and benefits of transesophageal echocardiogram have been explained including risks of esophageal damage, perforation (1:10,000 risk), bleeding, pharyngeal hematoma as well as other potential complications associated with conscious sedation including aspiration, arrhythmia, respiratory failure and death. Alternatives to treatment were discussed, questions were answered. Patient is willing to proceed.   BP has been 99/51 - 127/56 in the past 24 hours. Hgb at 10.3 and Platelets at 203 on 04/03/2015.  Erma [facility], PA-C 04/04/2015 1:19 PM

## 2015-04-05 NOTE — Progress Notes (Signed)
Patient seen and examined. Case d/w residents in detail. I agree with findings and plan as documented in Dr. Elon Jester note.  Patient with persistent fevers and persistently positive blood cx. ID f/u appreciated. C/w IV vanco for now. Will f/u path from LN biopsy for possible lymphoma.

## 2015-04-05 NOTE — Progress Notes (Signed)
[name] Progress Note  Assessment/Plan: 1. R chest and neck pain - soft tissue mass R chest and lymphadenopathy by CT. s/p LN biopsy by IR  2. MRSE bacteremia 11/4 2/2 + on Vanc - repeat cultures 11/6 1/2 coag neg staph- with peristant fevers- Tmax 102.1  Vanc 1.5 given 11/5 and 750 ordered for tomorrow after HD- will draw another set on HD 11/8  also check Vanc level pre HD . TEE done this am - pending 3. ESRD TTS - for HD today Na low - decrease volume- use right thigh graft 4. HTN/volume cont amlod/ MTP; UF 2.5 on Saturday- with post weight 67.2- weight last PM 71 - get standing weights with HD 5. Anemia max ESA Hgb 10.3  down to 8.3; on Aranesp/Fe 6. MBD cont hect, binders - last P 2.5 - if still low on recheck today, will hold 7. Chronic coumadin for DVT - CKA managing as OP 8. DM per primary - BS labile 9. Nutrition alb 2.8 - renal carb mod/renavit- hasn't eaten much in the last 24- 48hours - mostly NPO  [name] Jacobson, PA-C [place] [phone] 04/05/2015,11:07 AM  LOS: 5 days   Subjective:   Still with cough, sinus drainage, chest congestion  Objective Filed Vitals:   04/05/15 0940 04/05/15 0945 04/05/15 0950 04/05/15 1047  BP:  143/55  136/58  Pulse: 93 98  98  Temp:    98.8 F (37.1 C)  TempSrc:    Oral  Resp: 20 19 21 22   Height:      Weight:      SpO2: 95% 97%  98%   Physical Exam General: ill appearing Heart: RRR 90s Lungs:  Clear some with coughing but still with very coarse BS/occ wheeze Abdomen: soft NT Extremities: no LE edema Dialysis Access: right and leg thigh grafts- using right  Dialysis Orders: Center: [facility] 3.75 hr 400/600 2 K 2.5 Ca right thigh AVGG left thigh AVGG Hectorol 1 venofer 100 x 2 then 50 per week, Mircera 225 - started 11/3 - last got 200 10/6   Additional Objective Labs: Lab Results  Component Value Date   INR 1.51* 04/05/2015   INR 1.61* 04/04/2015   INR 1.91* 04/03/2015    Basic  Metabolic Panel:  Recent Labs Lab 04/02/15 0711 04/03/15 0221 04/05/15 0022  NA 128* 132* 128*  K 4.5 4.9 4.6  CL 94* 94* 92*  CO2 22 24 23   GLUCOSE 440* 50* 238*  BUN 47* 22* 42*  CREATININE 7.31* 4.94* 9.32*  CALCIUM 8.2* 8.8* 8.2*  PHOS  --  2.5  --    Liver Function Tests:  Recent Labs Lab 03/30/15 1944 03/31/15 1555 04/03/15 0221  AST 15 32  --   ALT 10* 15  --   ALKPHOS 145* 174*  --   BILITOT 0.7 0.8  --   PROT 7.7 7.7  --   ALBUMIN 2.8* 2.8* 2.8*   CBC:  Recent Labs Lab 03/30/15 1944 04/01/15 0419 04/02/15 0711 04/02/15 0839 04/03/15 0221 04/05/15 0022  WBC 6.5 7.5 8.0  --  9.5 6.8  NEUTROABS 4.9  --   --  5.8 6.4  --   HGB 8.9* 8.4* 8.6*  --  10.3* 8.3*  HCT 29.9* 27.3* 28.1*  --  32.8* 27.5*  MCV 94.9 92.9 93.4  --  94.3 93.9  PLT 159 160 167  --  203 165   Blood Culture    Component Value Date/Time  SDES TISSUE 04/04/2015 1715   SPECREQUEST HEAD NECK LYMPHADENOPATHY 04/04/2015 1715   CULT PENDING 04/04/2015 1715   REPTSTATUS PENDING 04/04/2015 1715   CBG:  Recent Labs Lab 04/04/15 1240 04/04/15 1330 04/04/15 1738 04/04/15 2148 04/05/15 0815  GLUCAP 66 72 60* 218* 232*   Studies/Results: Dg Chest 2 View  04/04/2015  CLINICAL DATA:  Cough, weakness, and fever for a few days. EXAM: CHEST  2 VIEW COMPARISON:  04/02/2015 FINDINGS: The cardiac silhouette is mildly enlarged, unchanged. There is persistent mild elevation of the right hemidiaphragm. The patient has taken a greater inspiration than on the prior study. There is slightly improved aeration of the lung bases with persistent mild basilar opacities and mild interstitial prominence bilaterally. No pneumothorax or pleural effusion is seen. Right upper quadrant abdominal surgical clips are noted. No acute osseous abnormality is identified. IMPRESSION: Greater inspiration with improved aeration of the lung bases. Residual mild bibasilar opacities may reflect atelectasis, mild interstitial  edema, or infection. Electronically Signed   By: [name] M.D.   On: 04/04/2015 09:26   US Biopsy  04/05/2015  CLINICAL DATA:  44 year old female with a history of head and neck lymphadenopathy EXAM: ULTRASOUND GUIDED CORE BIOPSY OF HEAD NECK LYMPH NODES MEDICATIONS: 1.0 mg IV Versed; 50 mcg IV Fentanyl Total Moderate Sedation Time: 20 PROCEDURE: The procedure, risks, benefits, and alternatives were explained to the patient. Questions regarding the procedure were encouraged and answered. The patient understands and consents to the procedure. Ultrasound survey of the left neck was performed with images stored and sent to PACs. The left neck was prepped with Betadine in a sterile fashion, and a sterile drape was applied covering the operative field. A sterile gown and sterile gloves were used for the procedure. Local anesthesia was provided with 1% Lidocaine. Once the patient is prepped and draped sterilely, the skin and subcutaneous tissues were generously infiltrated with 1% lidocaine without epinephrine. A small stab incision was made, an using ultrasound guidance, several separate 18 gauge core biopsy were retrieved of enlarged lymph node of the left cervical region. Final images demonstrate no complicating features. Patient tolerated the procedure well and remained hemodynamically stable throughout. No complications encountered and no significant blood loss encountered. COMPLICATIONS: None. FINDINGS: Ultrasound survey demonstrates borderline enlarged lymph node of the left head and neck, with the usual architecture maintained. Images during the case demonstrate multiple core biopsy with the tip of the needle/biopsy device through the node. IMPRESSION: Status post ultrasound-guided biopsy of left cervical lymph node. Sample was sent for culture, cytology, and pathology. Signed, [name]. [name], DO [facility] Electronically Signed   By: Corrie Mckusick D.O.   On: 04/05/2015 08:09   Medications:   . calcium acetate  667 mg Oral BID WC  . clonazePAM  1 mg Oral QHS  . [START ON 04/07/2015] darbepoetin (ARANESP) injection - DIALYSIS  200 mcg Intravenous Q Thu-HD  . dextromethorphan-guaiFENesin  1 tablet Oral BID  . dextrose  1 ampule Intravenous Once  . doxercalciferol  1 mcg Intravenous Q T,Th,Sa-HD  . enoxaparin  70 mg Subcutaneous Q24H  . feeding supplement (NEPRO CARB STEADY)  237 mL Oral BID BM  . ferric gluconate (FERRLECIT/NULECIT) IV  125 mg Intravenous Q T,Th,Sa-HD  . gabapentin  100 mg Oral Daily  . guaiFENesin  600 mg Oral BID  . insulin aspart  0-9 Units Subcutaneous TID WC  . insulin glargine  2 Units Subcutaneous BID  . lidocaine  1 patch Transdermal Q24H  . metoCLOPramide  10 mg Oral TID AC & HS  . montelukast  10 mg Oral QHS  . multivitamin  1 tablet Oral Daily  . pantoprazole  40 mg Oral QHS  . simvastatin  20 mg Oral QHS  . vancomycin  750 mg Intravenous Q T,Th,Sa-HD  . Warfarin - Pharmacist Dosing Inpatient   Does not apply [phone]

## 2015-04-05 NOTE — Progress Notes (Signed)
PT Cancellation Note  Patient Details Name: [name] MRN: [id number] DOB: Aug 30, 1970   Cancelled Treatment:    Reason Eval/Treat Not Completed: Patient declined, no reason specified Patient reports she is not feeling well enough to participate with physical therapy at this time despite encouragement. Will attempt PT evaluation tomorrow.  Ellouise Newer 04/05/2015, 5:10 PM Camille Bal [place], [place]

## 2015-04-05 NOTE — Progress Notes (Signed)
CRITICAL VALUE ALERT  Critical value received:  Positive blood cultures in aerobic bottle. Gram positive cocci in chains and pairs.  Date of notification:  04/05/15  Time of notification:  1605  Critical value read back:Yes.    Nurse who received alert:  Larena Glassman, RN  MD notified (1st page):  Dr. Marlowe Sax  Time of first page:  1606  MD notified (2nd page):  Time of second page:  Responding MD:  Dr. Marlowe Sax  Time MD responded:  [phone]

## 2015-04-05 NOTE — Progress Notes (Signed)
PT Cancellation Note  Patient Details Name: [name] MRN: [id number] DOB: [birth year]   Cancelled Treatment:    Reason Eval/Treat Not Completed: Patient at procedure or test/unavailable Patient is off unit for dialysis treatment. Will check back for PT evaluation.  Ellouise Newer 04/05/2015, 2:44 PM  Camille Bal [place], [employer]

## 2015-04-05 NOTE — CV Procedure (Signed)
Brief TEE Note:  LVEF >55% Mild MR, moderate TR, trivial AR.  No PR No LA or LAA thrombus or mass No vegetation or mass  For further details see full report.  Leovardo Thoman C. Oval Linsey, MD 04/05/2015 9:07 AM

## 2015-04-05 NOTE — Progress Notes (Signed)
Subjective:  Hospital day 5  Patient continues to have worsening cough and states she is not able to get up and walk because she feels weak/tired.  Denies SOB at rest, but is needing 2L O2 via [place].  Patient has been NPO since midnight for TEE procedure done this morning, and says this procedure really tired her out. States she is currently not having any pain, rashes or known skin breakdown.  No other complaints at this time.  Objective: Vital signs  Blood pressure 132/64, pulse 100, temperature 98.8 F (37.1 C), temperature source Oral, resp. rate 22, height 5\' 1"  (1.549 m), weight 69.7 kg (153 lb 10.6 oz), last menstrual period 03/02/2015, SpO2 98 %, unknown if currently breastfeeding. Weight change: 3.6 kg (7 lb 15 oz)  Filed Weights   04/04/15 0500 04/04/15 2149 04/05/15 1207  Weight: 71.668 kg (158 lb) 71.7 kg (158 lb 1.1 oz) 69.7 kg (153 lb 10.6 oz)    Intake/Output Summary (Last 24 hours) at 04/05/15 1455 Last data filed at 04/05/15 0900  Gross per 24 hour  Intake    315 ml  Output      0 ml  Net    315 ml   Physical exam Vitals Blood pressure 132/64, pulse 100, temperature 98.8 F (37.1 C), temperature source Oral, resp. rate 22, height 5\' 1"  (1.549 m), weight 69.7 kg (153 lb 10.6 oz), last menstrual period 03/02/2015, SpO2 98 %   General Chronically-ill appearing laying in bed in NAD with depressed-appearing affect.  Appears tired and disheveled.    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  Sclera are muddy but anicteric  Neck Supraclavicular nodes still present.  No obvious JVD.  Patient does not have any IJ or obvious other lines.  Cardiovascular Deferred to resident  Pulmonary Normal work of breathing on 2L [place], however, patient appears to be lying down more often than sitting upright and giving poor inspiratory effort  Abdomen Deferred  Skin Right AV fistula visible in right groin; no surrounding erythema or  signs of infection.  No obvious area of skin breakdown.   Neuro Patient had asterixis present in bilateral hands; also noticeable when she was using her arms to support herself while sitting upright  Psych Affect is blunted.    Lab Results: CMP Latest Ref Rng 04/05/2015 04/05/2015 04/03/2015  Glucose 65 - 99 mg/dL 254(H) 238(H) 50(L)  BUN 6 - 20 mg/dL 49(H) 42(H) 22(H)  Creatinine 0.44 - 1.00 mg/dL 10.31(H) 9.32(H) 4.94(H)  Sodium 135 - 145 mmol/L 128(L) 128(L) 132(L)  Potassium 3.5 - 5.1 mmol/L 5.1 4.6 4.9  Chloride 101 - 111 mmol/L 93(L) 92(L) 94(L)  CO2 22 - 32 mmol/L 19(L) 23 24  Calcium 8.9 - 10.3 mg/dL 8.2(L) 8.2(L) 8.8(L)  Total Protein 6.5 - 8.1 g/dL - - -  Total Bilirubin 0.3 - 1.2 mg/dL - - -  Alkaline Phos 38 - 126 U/L - - -  AST 15 - 41 U/L - - -  ALT 14 - 54 U/L - - -   CBC Latest Ref Rng 04/05/2015 04/05/2015 04/03/2015  WBC 4.0 - 10.5 K/uL 7.0 6.8 9.5  Hemoglobin 12.0 - 15.0 g/dL 8.8(L) 8.3(L) 10.3(L)  Hematocrit 36.0 - 46.0 % 28.3(L) 27.5(L) 32.8(L)  Platelets 150 - 400 K/uL 164 165 203   Micro Results: Recent Results (from the past 240 hour(s))  Culture, blood (routine x 2)     Status: None   Collection Time:  04/01/15  7:50 PM  Result Value Ref Range Status   Specimen Description BLOOD RIGHT ARM  Final   Special Requests IN PEDIATRIC BOTTLE 1CC  Final   Culture  Setup Time   Final    GRAM POSITIVE COCCI IN CLUSTERS AEROBIC BOTTLE ONLY CRITICAL RESULT CALLED TO, READ BACK BY AND VERIFIED WITH: A RIO 04/02/15 @ 28 M VESTAL    Culture STAPHYLOCOCCUS SPECIES (COAGULASE NEGATIVE)  Final   Report Status 04/04/2015 FINAL  Final   Organism ID, Bacteria STAPHYLOCOCCUS SPECIES (COAGULASE NEGATIVE)  Final      Susceptibility   Staphylococcus species (coagulase negative) - MIC*    CIPROFLOXACIN >=8 RESISTANT Resistant     ERYTHROMYCIN >=8 RESISTANT Resistant     GENTAMICIN <=0.5 SENSITIVE Sensitive     OXACILLIN >=4 RESISTANT Resistant     TETRACYCLINE >=16 RESISTANT  Resistant     VANCOMYCIN 1 SENSITIVE Sensitive     TRIMETH/SULFA 160 RESISTANT Resistant     CLINDAMYCIN <=0.25 SENSITIVE Sensitive     RIFAMPIN <=0.5 SENSITIVE Sensitive     Inducible Clindamycin NEGATIVE Sensitive     * STAPHYLOCOCCUS SPECIES (COAGULASE NEGATIVE)  Culture, blood (routine x 2)     Status: None   Collection Time: 04/01/15  7:56 PM  Result Value Ref Range Status   Specimen Description BLOOD LEFT HAND  Final   Special Requests IN PEDIATRIC BOTTLE 2CC  Final   Culture  Setup Time   Final    GRAM POSITIVE COCCI IN CLUSTERS AEROBIC BOTTLE ONLY CRITICAL RESULT CALLED TO, READ BACK BY AND VERIFIED WITH: A RIO 04/02/15 @ 52 M VESTAL    Culture   Final    STAPHYLOCOCCUS SPECIES (COAGULASE NEGATIVE) SUSCEPTIBILITIES PERFORMED ON PREVIOUS CULTURE WITHIN THE LAST 5 DAYS.    Report Status 04/04/2015 FINAL  Final  Culture, blood (routine x 2)     Status: None (Preliminary result)   Collection Time: 04/03/15 11:45 AM  Result Value Ref Range Status   Specimen Description BLOOD RIGHT HAND  Final   Special Requests IN PEDIATRIC BOTTLE 4CC  Final   Culture  Setup Time   Final    GRAM POSITIVE COCCI IN CLUSTERS CRITICAL RESULT CALLED TO, READ BACK BY AND VERIFIED WITH: Louanne Skye 10:25 04/04/15 ([email or web address])    Culture   Final    STAPHYLOCOCCUS SPECIES (COAGULASE NEGATIVE) SUSCEPTIBILITIES PERFORMED ON PREVIOUS CULTURE WITHIN THE LAST 5 DAYS.    Report Status PENDING  Incomplete  Culture, blood (routine x 2)     Status: None (Preliminary result)   Collection Time: 04/03/15  7:35 PM  Result Value Ref Range Status   Specimen Description BLOOD RIGHT ANTECUBITAL  Final   Special Requests BOTTLES DRAWN AEROBIC ONLY 8CC  Final   Culture NO GROWTH 2 DAYS  Final   Report Status PENDING  Incomplete  Tissue culture     Status: None (Preliminary result)   Collection Time: 04/04/15  5:15 PM  Result Value Ref Range Status   Specimen Description TISSUE  Final   Special Requests HEAD NECK  LYMPHADENOPATHY  Final   Gram Stain   Final    NO WBC SEEN NO ORGANISMS SEEN Performed at [facility]    Culture PENDING  Incomplete   Report Status PENDING  Incomplete   Studies/Results: Dg Chest 2 View  04/04/2015  CLINICAL DATA:  Cough, weakness, and fever for a few days. EXAM: CHEST  2 VIEW COMPARISON:  04/02/2015 FINDINGS: The cardiac silhouette is mildly enlarged,  unchanged. There is persistent mild elevation of the right hemidiaphragm. The patient has taken a greater inspiration than on the prior study. There is slightly improved aeration of the lung bases with persistent mild basilar opacities and mild interstitial prominence bilaterally. No pneumothorax or pleural effusion is seen. Right upper quadrant abdominal surgical clips are noted. No acute osseous abnormality is identified. IMPRESSION: Greater inspiration with improved aeration of the lung bases. Residual mild bibasilar opacities may reflect atelectasis, mild interstitial edema, or infection. Electronically Signed   By: [name] M.D.   On: 04/04/2015 09:26   US Biopsy  04/05/2015  CLINICAL DATA:  44 year old female with a history of head and neck lymphadenopathy EXAM: ULTRASOUND GUIDED CORE BIOPSY OF HEAD NECK LYMPH NODES MEDICATIONS: 1.0 mg IV Versed; 50 mcg IV Fentanyl Total Moderate Sedation Time: 20 PROCEDURE: The procedure, risks, benefits, and alternatives were explained to the patient. Questions regarding the procedure were encouraged and answered. The patient understands and consents to the procedure. Ultrasound survey of the left neck was performed with images stored and sent to PACs. The left neck was prepped with Betadine in a sterile fashion, and a sterile drape was applied covering the operative field. A sterile gown and sterile gloves were used for the procedure. Local anesthesia was provided with 1% Lidocaine. Once the patient is prepped and draped sterilely, the skin and subcutaneous tissues were generously  infiltrated with 1% lidocaine without epinephrine. A small stab incision was made, an using ultrasound guidance, several separate 18 gauge core biopsy were retrieved of enlarged lymph node of the left cervical region. Final images demonstrate no complicating features. Patient tolerated the procedure well and remained hemodynamically stable throughout. No complications encountered and no significant blood loss encountered. COMPLICATIONS: None. FINDINGS: Ultrasound survey demonstrates borderline enlarged lymph node of the left head and neck, with the usual architecture maintained. Images during the case demonstrate multiple core biopsy with the tip of the needle/biopsy device through the node. IMPRESSION: Status post ultrasound-guided biopsy of left cervical lymph node. Sample was sent for culture, cytology, and pathology. Signed, [name]. [name], DO [facility] Electronically Signed   By: Corrie Mckusick D.O.   On: 04/05/2015 08:09   TEE 11/8: Study Conclusions - Left ventricle: Systolic function was normal. The estimated ejection fraction was in the range of 60% to 65%. Wall motion was normal; there were no regional wall motion abnormalities. - Aortic valve: No evidence of vegetation. There was trivial regurgitation. - Mitral valve: No evidence of vegetation. There was mild regurgitation. - Left atrium: No evidence of thrombus in the atrial cavity or appendage. - Right atrium: No evidence of thrombus in the atrial cavity or appendage. - Atrial septum: No defect or patent foramen ovale was identified. There was an atrial septal aneurysm. - Tricuspid valve: There was moderate regurgitation.  Impressions: - No evidence of thrombus of vegetation.   Medications: I have reviewed the patient's current medications. Scheduled Meds: . calcium acetate  667 mg Oral BID WC  . clonazePAM  1 mg Oral QHS  . [START ON 04/07/2015]  darbepoetin (ARANESP) injection - DIALYSIS  200 mcg Intravenous Q Thu-HD  . dextromethorphan-guaiFENesin  1 tablet Oral BID  . dextrose  1 ampule Intravenous Once  . doxercalciferol  1 mcg Intravenous Q T,Th,Sa-HD  . enoxaparin  70 mg Subcutaneous Q24H  . feeding supplement (NEPRO CARB STEADY)  237 mL Oral BID BM  . ferric gluconate (FERRLECIT/NULECIT) IV  125 mg Intravenous  Q T,Th,Sa-HD  . gabapentin  100 mg Oral Daily  . guaiFENesin  600 mg Oral BID  . insulin aspart  0-9 Units Subcutaneous TID WC  . insulin glargine  2 Units Subcutaneous BID  . lidocaine  1 patch Transdermal Q24H  . metoCLOPramide  10 mg Oral TID AC & HS  . montelukast  10 mg Oral QHS  . multivitamin  1 tablet Oral Daily  . pantoprazole  40 mg Oral QHS  . simvastatin  20 mg Oral QHS  . vancomycin  750 mg Intravenous Q T,Th,Sa-HD  . warfarin  7.5 mg Oral ONCE-1800  . 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[place])   End-stage renal disease on hemodialysis ([place])   Chronic diastolic heart failure ([place])   History of DVT of lower extremity   Coag negative Staphylococcus bacteremia  [name] is a 44 y/o female with PMH significant for Type 1 DM since childhood with ESRD s/p 2013 renal/pancreatic transplant with failure in 2014 now on T/Th/Sat HD and multiple previous admission for DKA (4 times in 2016, Mar/Jun/July/Aug), admitted for work-up of bilateral supraclavicular lymphadenopathy suspicious for lymphoma vs metastasis, now with  cough and persistent fevers in context of vancomycin treatment for oxacillin-resistant, coag-neg staph bacteremia without known infectious source.    Of note, patient has also had the issue of difficult-to-control blood glucose. She had very high blood glucose on admission (>1000) but no lab evidence of anion gap or acidosis, with significant blood improvement after insulin drip, but has since been hypoglycemic on home regimen of 7 units Lantus BID + SSI (since modified, see below).  #Lymphadenopathy suspected 2/2 lymphoma vs mets: bilateral supraclavicular node lymphadenopathy and CT showing mediastinal lymphadenopathy with spiculated mass in right pectoralis major  -Follow up cytology/pathology results from biopsy performed 11/7 -CT abd/pelvis obtained evening of 11/4 showed stomach wall thickening suggestive of possible diffuse neoplastic process, correlate with pathology as needed.  Consider outpatient EGD.  #Bacteremia with coag-negative, oxacillin resistance staph: no obvious source of skin breakdown and TEE negative for vegetations -continue vanc 750mg  during T/Th/Sat HD -repeat culture 11/6 had one bottle positive, repeat cultures drawn 11/8 are pending -ID consult from Dr. Linus Salmons obtained 11/8  #SOB: evidence of pulmonary congestion on chest imaging 11/5, unclear underlying etiology, r/o infection given persistent fever -Repeat CXR obtained did not show obvious evidence of consolidation -Continue Mucinex 600mg  BID, singulair 10mg  daily at bedtime -O2 [place] titrated so that O2 is >88%  -PE is also on the differential, Wells score is 3 (DVT earlier this year, tachycardia as of 2:30PM), or 4 if one counts malignancy criteria, consider CT angiogram if patient continues to have worsening desaturations or O2 requirements   #GERD/esophageal dysmotility:  Had barrium swallow in 10/2014 showing evidence of dysmotility, 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 Outpatient EGD given CT findings of possible neoplastic process involving GI tract  #T1DM: >1000 blood glucose on admission improved after insulin drip, but has since had problems with hypoglycemia even on modified home regimen of BID Lantus -Lantus 2 Units BID-- consider one time dose of 4 units  in the AM -Continue with sliding scale insulin aspart TID with meals  -Continue to monitor fingerstick and CBG  #ESRD on T/Th/Sat HD: -calcium acetate 667mg  BID with meals -doxercalciferol 104mcg with HD -vanc with HD as above  #Pain: -Tylenol 650mg  q6 PRN -5mg  oxycodone increased from q6hrs PRN  #Mood: blunted affect observed in context of suspected cancer dx, however pt denies SIGEAPS symptoms outside the hospital -Recommend PCP reassessment of mood at f/u  -Would consider starting 25mg  sertraline x1 week and increasing to 50mg  if tolerated  #Anemia of chronic disease -270mcg Darbopoetin Alfa every Thursday during HD to stimulate bone marrow   #Prior DVT -Holding anticoagulation with warfarin since 11/4 in anticipation of biopsy -Last Lovenox 11/6, held after biopsy 11/7 in anticipation of TEE -Resume lovenox 70mg  q24hrs and restart warfarin per pharmacy dosing on 11/8  #Sleep: pt on 1mg  clonzaepam at home daily at bedtime -Continue home dose to avoid benzo withdrawal   #HTN -hold amlodipine and metoprolol until after procedure  #HLD -continue simvastatin 20mg   #Dispo: deferred at this time -Will need outpatient follow up with heme/onc and PCP  This is a Careers information officer Note.  The care of the patient was discussed with Dr. Berline Lopes and the assessment and plan formulated with their assistance.  Please see their attached note for official documentation of the daily encounter.   LOS: 5 days   Ladell Pier, Med Student 04/05/2015, 2:55 PM

## 2015-04-05 NOTE — Interval H&P Note (Signed)
History and Physical Interval Note:  04/05/2015 7:54 AM  [name]  has presented today for surgery, with the diagnosis of BACTEREMIA  The various methods of treatment have been discussed with the patient and family. After consideration of risks, benefits and other options for treatment, the patient has consented to  Procedure(s): TRANSESOPHAGEAL ECHOCARDIOGRAM (TEE) (N/A) as a surgical intervention .  The patient's history has been reviewed, patient examined, no change in status, stable for surgery.  I have reviewed the patient's chart and labs.  Questions were answered to the patient's satisfaction.     Sharol Harness, MD 04/05/2015 7:54 AM

## 2015-04-05 NOTE — Progress Notes (Signed)
Pt lost IV access overnight, unable to get IV per IV team. Endoscopy aware, sending transport. Will continue to monitor.    Penni Bombard, RN 04/05/2015 7:19 AM

## 2015-04-05 NOTE — Progress Notes (Signed)
Subjective: Patient was seen and examined at bedside this morning. Temperature this morning 101 F. Patient continues to complain of a cough and chest congestion. Denies any chest pain/sob.n/v. Currently on 2L O2 via [place]. No other complaints.   Objective: Vital signs in last 24 hours: Filed Vitals:   04/05/15 1530 04/05/15 1600 04/05/15 1610 04/05/15 1732  BP: 137/66 131/63 139/88 127/81  Pulse: 103 104 105 107  Temp:   100.6 F (38.1 C) 102.5 F (39.2 C)  TempSrc:   Oral Oral  Resp:   20 20  Height:      Weight:   146 lb 2.6 oz (66.3 kg)   SpO2:   98% 98%   Weight change: 7 lb 15 oz (3.6 kg)  Intake/Output Summary (Last 24 hours) at 04/05/15 1802 Last data filed at 04/05/15 1700  Gross per 24 hour  Intake    195 ml  Output   3200 ml  Net  -3005 ml   Physical Exam: Gen: AAOx3, NAD HEENT: bilateral enlarged supraclavicular LN, tender to palpation on right side CVS: RRR, S1 and S2 appreciated, systolic murmur   Lungs: diffuse rhonchi  Abd: soft, NT, ND.  Ext: no edema Skin: warm and dry. No rashes noted.    Lab Results: Basic Metabolic Panel:  Recent Labs Lab 04/03/15 0221 04/05/15 0022 04/05/15 1112  NA 132* 128* 128*  K 4.9 4.6 5.1  CL 94* 92* 93*  CO2 24 23 19*  GLUCOSE 50* 238* 254*  BUN 22* 42* 49*  CREATININE 4.94* 9.32* 10.31*  CALCIUM 8.8* 8.2* 8.2*  PHOS 2.5  --  4.8*   Liver Function Tests:  Recent Labs Lab 03/30/15 1944 03/31/15 1555 04/03/15 0221 04/05/15 1112  AST 15 32  --   --   ALT 10* 15  --   --   ALKPHOS 145* 174*  --   --   BILITOT 0.7 0.8  --   --   PROT 7.7 7.7  --   --   ALBUMIN 2.8* 2.8* 2.8* 2.5*   CBC:  Recent Labs Lab 04/02/15 0839 04/03/15 0221 04/05/15 0022 04/05/15 1112  WBC  --  9.5 6.8 7.0  NEUTROABS 5.8 6.4  --   --   HGB  --  10.3* 8.3* 8.8*  HCT  --  32.8* 27.5* 28.3*  MCV  --  94.3 93.9 95.3  PLT  --  203 165 164   CBG:  Recent Labs Lab 04/04/15 1240 04/04/15 1330 04/04/15 1738  04/04/15 2148 04/05/15 0815 04/05/15 1737  GLUCAP 66 72 60* 218* 232* 184*   Hemoglobin A1C:  Recent Labs Lab 03/31/15 1912  HGBA1C 10.0*   Coagulation:  Recent Labs Lab 04/02/15 0837 04/03/15 0221 04/04/15 0355 04/05/15 0022  LABPROT 21.3* 21.8* 19.1* 18.3*  INR 1.85* 1.91* 1.61* 1.51*    Micro Results: Recent Results (from the past 240 hour(s))  Culture, blood (routine x 2)     Status: None   Collection Time: 04/01/15  7:50 PM  Result Value Ref Range Status   Specimen Description BLOOD RIGHT ARM  Final   Special Requests IN PEDIATRIC BOTTLE 1CC  Final   Culture  Setup Time   Final    GRAM POSITIVE COCCI IN CLUSTERS AEROBIC BOTTLE ONLY CRITICAL RESULT CALLED TO, READ BACK BY AND VERIFIED WITH: A RIO 04/02/15 @ 57 M VESTAL    Culture STAPHYLOCOCCUS SPECIES (COAGULASE NEGATIVE)  Final   Report Status 04/04/2015 FINAL  Final   Organism  ID, Bacteria STAPHYLOCOCCUS SPECIES (COAGULASE NEGATIVE)  Final      Susceptibility   Staphylococcus species (coagulase negative) - MIC*    CIPROFLOXACIN >=8 RESISTANT Resistant     ERYTHROMYCIN >=8 RESISTANT Resistant     GENTAMICIN <=0.5 SENSITIVE Sensitive     OXACILLIN >=4 RESISTANT Resistant     TETRACYCLINE >=16 RESISTANT Resistant     VANCOMYCIN 1 SENSITIVE Sensitive     TRIMETH/SULFA 160 RESISTANT Resistant     CLINDAMYCIN <=0.25 SENSITIVE Sensitive     RIFAMPIN <=0.5 SENSITIVE Sensitive     Inducible Clindamycin NEGATIVE Sensitive     * STAPHYLOCOCCUS SPECIES (COAGULASE NEGATIVE)  Culture, blood (routine x 2)     Status: None   Collection Time: 04/01/15  7:56 PM  Result Value Ref Range Status   Specimen Description BLOOD LEFT HAND  Final   Special Requests IN PEDIATRIC BOTTLE 2CC  Final   Culture  Setup Time   Final    GRAM POSITIVE COCCI IN CLUSTERS AEROBIC BOTTLE ONLY CRITICAL RESULT CALLED TO, READ BACK BY AND VERIFIED WITH: A RIO 04/02/15 @ 79 M VESTAL    Culture   Final    STAPHYLOCOCCUS SPECIES (COAGULASE  NEGATIVE) SUSCEPTIBILITIES PERFORMED ON PREVIOUS CULTURE WITHIN THE LAST 5 DAYS.    Report Status 04/04/2015 FINAL  Final  Culture, blood (routine x 2)     Status: None (Preliminary result)   Collection Time: 04/03/15 11:45 AM  Result Value Ref Range Status   Specimen Description BLOOD RIGHT HAND  Final   Special Requests IN PEDIATRIC BOTTLE 4CC  Final   Culture  Setup Time   Final    GRAM POSITIVE COCCI IN CLUSTERS CRITICAL RESULT CALLED TO, READ BACK BY AND VERIFIED WITH: Louanne Skye 10:25 04/04/15 ([email or web address])    Culture   Final    STAPHYLOCOCCUS SPECIES (COAGULASE NEGATIVE) SUSCEPTIBILITIES PERFORMED ON PREVIOUS CULTURE WITHIN THE LAST 5 DAYS.    Report Status PENDING  Incomplete  Culture, blood (routine x 2)     Status: None (Preliminary result)   Collection Time: 04/03/15  7:35 PM  Result Value Ref Range Status   Specimen Description BLOOD RIGHT ANTECUBITAL  Final   Special Requests BOTTLES DRAWN AEROBIC ONLY 8CC  Final   Culture NO GROWTH 2 DAYS  Final   Report Status PENDING  Incomplete  Tissue culture     Status: None (Preliminary result)   Collection Time: 04/04/15  5:15 PM  Result Value Ref Range Status   Specimen Description TISSUE  Final   Special Requests HEAD NECK LYMPHADENOPATHY  Final   Gram Stain   Final    NO WBC SEEN NO ORGANISMS SEEN Performed at [facility]    Culture PENDING  Incomplete   Report Status PENDING  Incomplete  Culture, blood (routine x 2)     Status: None (Preliminary result)   Collection Time: 04/05/15 12:22 AM  Result Value Ref Range Status   Specimen Description BLOOD RIGHT HAND  Final   Special Requests IN PEDIATRIC BOTTLE 3CC  Final   Culture  Setup Time   Final    GRAM POSITIVE COCCI IN CHAINS IN PAIRS AEROBIC BOTTLE ONLY CRITICAL RESULT CALLED TO, READ BACK BY AND VERIFIED WITHDwaine Gale RN [id number] 04/05/15 A BROWNING    Culture PENDING  Incomplete   Report Status PENDING  Incomplete   Studies/Results: Dg Chest 2  View  04/04/2015  CLINICAL DATA:  Cough, weakness, and fever for a few days. EXAM: CHEST  2 VIEW COMPARISON:  04/02/2015 FINDINGS: The cardiac silhouette is mildly enlarged, unchanged. There is persistent mild elevation of the right hemidiaphragm. The patient has taken a greater inspiration than on the prior study. There is slightly improved aeration of the lung bases with persistent mild basilar opacities and mild interstitial prominence bilaterally. No pneumothorax or pleural effusion is seen. Right upper quadrant abdominal surgical clips are noted. No acute osseous abnormality is identified. IMPRESSION: Greater inspiration with improved aeration of the lung bases. Residual mild bibasilar opacities may reflect atelectasis, mild interstitial edema, or infection. Electronically Signed   By: [name] M.D.   On: 04/04/2015 09:26   US Biopsy  04/05/2015  CLINICAL DATA:  44 year old female with a history of head and neck lymphadenopathy EXAM: ULTRASOUND GUIDED CORE BIOPSY OF HEAD NECK LYMPH NODES MEDICATIONS: 1.0 mg IV Versed; 50 mcg IV Fentanyl Total Moderate Sedation Time: 20 PROCEDURE: The procedure, risks, benefits, and alternatives were explained to the patient. Questions regarding the procedure were encouraged and answered. The patient understands and consents to the procedure. Ultrasound survey of the left neck was performed with images stored and sent to PACs. The left neck was prepped with Betadine in a sterile fashion, and a sterile drape was applied covering the operative field. A sterile gown and sterile gloves were used for the procedure. Local anesthesia was provided with 1% Lidocaine. Once the patient is prepped and draped sterilely, the skin and subcutaneous tissues were generously infiltrated with 1% lidocaine without epinephrine. A small stab incision was made, an using ultrasound guidance, several separate 18 gauge core biopsy were retrieved of enlarged lymph node of the left cervical region.  Final images demonstrate no complicating features. Patient tolerated the procedure well and remained hemodynamically stable throughout. No complications encountered and no significant blood loss encountered. COMPLICATIONS: None. FINDINGS: Ultrasound survey demonstrates borderline enlarged lymph node of the left head and neck, with the usual architecture maintained. Images during the case demonstrate multiple core biopsy with the tip of the needle/biopsy device through the node. IMPRESSION: Status post ultrasound-guided biopsy of left cervical lymph node. Sample was sent for culture, cytology, and pathology. Signed, [name]. [name], DO [facility] Electronically Signed   By: Corrie Mckusick D.O.   On: 04/05/2015 08:09   Medications: I have reviewed the patient's current medications. Scheduled Meds: . calcium acetate  667 mg Oral BID WC  . clonazePAM  1 mg Oral QHS  . [START ON 04/07/2015] darbepoetin (ARANESP) injection - DIALYSIS  200 mcg Intravenous Q Thu-HD  . dextromethorphan-guaiFENesin  1 tablet Oral BID  . dextrose  1 ampule Intravenous Once  . doxercalciferol      . doxercalciferol  1 mcg Intravenous Q T,Th,Sa-HD  . enoxaparin  70 mg Subcutaneous Q24H  . feeding supplement (NEPRO CARB STEADY)  237 mL Oral BID BM  . gabapentin  100 mg Oral Daily  . guaiFENesin  600 mg Oral BID  . insulin aspart  0-9 Units Subcutaneous TID WC  . insulin glargine  2 Units Subcutaneous BID  . lidocaine  1 patch Transdermal Q24H  . metoCLOPramide  10 mg Oral TID AC & HS  . montelukast  10 mg Oral QHS  . multivitamin  1 tablet Oral Daily  . pantoprazole  40 mg Oral QHS  . simvastatin  20 mg Oral QHS  . vancomycin  750 mg Intravenous Q T,Th,Sa-HD  . 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 Also got CT abdomen which shows small ascites, some cirrhotic changes of liver, diffuse stomach wall thickening concerning for gastritis vs neoplastic involvement (gastric carcinoma vs lymphoma?). HIV and LDH neg. LN biopsy was done yesterday by IR, results pending.  - F/u biopsy results   - Oxycodone 5 mg q6 prn pain    -Lidoderm patch   Fever - Temperature 101 F this am. Repeat BCX (aerobic bottle) growing GPC in chains/ pairs concerning for Strep. Vancomycin started on 11/5. TEE did not show any vegetations. ID has been consulted and is on board. They believe differential for fever includes infection vs tumor fever if patient does indeed have lymphoma/ mets. Skin was examined today and no rashes noted.  - cont vancomycin with HD -Tylenol prn  - f/up repeat BCX results  Cough: Patient was also coughing and lung exam remarkable for diffuse rhonchi. Currently not complaining of any SOB but on 2L O2 via [place]. CXR consistent with atelectasis/ mild interstitial edema/ infection. -continue Incentive spirometer -continue Mucinex-DM for congestion  -Albuterol nebulizer prn -Singulair 10 mg daily  Severe hyperglycemia with DM Type I - did not receive insulin over 24 hours before we admitted her. Glucose was as high as 1000, without acidosis or anion gap. She was on insulin drip, and has been transitioned to lantus 7 untis BID with SSI and meal time insulin. She now became more hypoglycemic in 80's. D/ced meal  time insulin today and reduced lantus to 4 units BID. CBG 117 this am. - Continue Lantus 4 u BID - sensitive-SSI  Pseudohyponatremia - monitor with insulin regimen.  H/o LLE DVT (01/19/2015):  -Coumadin with Lovenox bridge as per pharmacy   HFpEF: Stable. EE showing LVEF >55%, mild MR, moderate TR, and trivial AR. No PR.  -Monitor   ESRD on HD (TTS): New left AVG in 12/2014. Went for HD today.  - appreciate nephro recs.   HTN: BP stable. Continue holding home Amlodipine 10 mg qd, Metoprolol 50 mg qd for now.   HLD: continue Zocor 20 mg qd  GERD: Protonix 40 mg qd   Insomnia: continue Ambien qhs    Code: Full  Dispo: Disposition is deferred at this time, awaiting improvement of current medical problems.  Anticipated discharge in approximately 0 day(s).   The patient does have a current PCP [name], MD) and does need an [facility] hospital follow-up appointment after discharge.  The patient does not have transportation limitations that hinder transportation to clinic appointments.  .Services Needed at time of discharge: Y = Yes, Blank = No PT:   OT:   RN:   Equipment:   Other:     LOS: 5 days   Shela Leff, MD 04/05/2015, 6:02 PM

## 2015-04-05 NOTE — Procedures (Signed)
Patient seen on Hemodialysis. QB 400, UF goal 3.7L Treatment adjusted as needed.  Elmarie Shiley MD [facility]. Office # [phone] Pager # [phone] 2:32 PM

## 2015-04-05 NOTE — Consult Note (Addendum)
[place] for Infectious Disease       Reason for Consult:  CoNS bacteremia   Referring Physician: Dr. 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 calcium acetate  667 mg Oral BID WC  . clonazePAM  1 mg Oral QHS  . [START ON 04/07/2015] darbepoetin (ARANESP) injection - DIALYSIS  200 mcg Intravenous Q Thu-HD  . dextromethorphan-guaiFENesin  1 tablet Oral BID  . dextrose  1 ampule Intravenous Once  . doxercalciferol  1 mcg Intravenous Q T,Th,Sa-HD  . enoxaparin  70 mg Subcutaneous Q24H  . feeding supplement (NEPRO CARB STEADY)  237 mL Oral BID BM  . ferric gluconate (FERRLECIT/NULECIT) IV  125 mg Intravenous Q T,Th,Sa-HD  . gabapentin  100 mg Oral Daily  . guaiFENesin  600 mg Oral BID  . insulin aspart  0-9 Units Subcutaneous TID WC  . insulin glargine  2 Units Subcutaneous BID  . lidocaine  1 patch Transdermal Q24H  . metoCLOPramide  10 mg Oral TID AC & HS  . montelukast  10 mg Oral QHS  . multivitamin  1 tablet Oral Daily  . pantoprazole  40 mg Oral QHS  . simvastatin  20 mg Oral QHS  . vancomycin  750 mg Intravenous Q T,Th,Sa-HD  . warfarin  7.5 mg Oral ONCE-1800  . Warfarin - Pharmacist Dosing Inpatient   Does not apply q1800    Recommendations: Continue with vancomycin after dialysis   Assessment: She has persistent fever despite vancomycin for CoNS.  Fever is in the am typically.  Differential of fever includes the infection or tumor fever if she does indeed have lymphoma/mets. Has not been on recent immunosuppressive drugs.  From an infection standpoint, I do not know why she has persistent fevers and persistent bacteremia.  Will  watch for now.  Antibiotics: vancomycin  HPI: [name] is a 44 y.o. female with ESRD on HD, history of renal/pancreas transplant, admitted 11/3 with neck mass and concern for cancer.  Biopsied and results pending.  Developed fever and grew 2/2 CoNS and repeat cultures also 1/2 positive. She has remained febrile with fever this am to 101.  Reports associated chills. Had TEE which is negative for vegetation. No catheter, has a fistula.   CXR independently reviewed and no significant opacity.   Review of Systems:  Gastrointestinal: negative for nausea and diarrhea All other systems reviewed and are negative   Past Medical History  Diagnosis Date  . Hyperlipidemia   . Alopecia   . Obesity   . Iron deficiency   . RLS (restless legs syndrome)   . Injury of conjunctiva and corneal abrasion of right eye without foreign body   . Cellulitis   . Anemia   . Blood transfusion 2004    "when I had my transplant"  . Migraine   . Hyperparathyroidism, secondary ([place])   . Diabetic retinopathy   . Hypertension     takes Metoprolol and Exforge daily  . Depression     takes Klonopin nightly  . Diabetes mellitus type 1 (HCC)     Pt states diagnosed at age 59 with prior episodes of DKA. S/p pancreatic transplant   .  GERD (gastroesophageal reflux disease)     takes Protonix daily  . Bronchitis   . Migraine   . Asthma   . Emphysema of lung ([place])   . Angina     none in past 2 years.  . Stomach pain     Hx: of chronic  . Pneumonia 2012    "double"  . Dialysis patient ([place])     tues,thurs,sat  . 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  Surgery was done at [facility].     . DKA (diabetic ketoacidoses) ([place]) 12/01/2014  . Diastolic CHF, acute on chronic ([place]) 01/02/2015  . Stroke  [facility]) 03/2014    ? TIA  . ESRD (end stage renal disease) [facility])     S/p pancreatic and kidney transplant, but back on HD 2008, dialysis at [facility], Sat    Social History  Substance Use Topics  . Smoking status: Never Smoker   . Smokeless tobacco: Never Used  . Alcohol Use: No    Family History  Problem Relation Age of Onset  . Hypertension Mother   . Kidney disease Mother   . Diabetes Mother   . Hypertension Father   . Kidney disease Father   . Diabetes Father     Allergies  Allergen Reactions  . Depakote [Divalproex Sodium] Other (See Comments)    Pt gets "delirious"  . Morphine And Related Nausea And Vomiting and Other (See Comments)    "makes me delirious"  . Penicillins Anaphylaxis    Tolerated Zosyn Dec 2014  . Tramadol Nausea And Vomiting  . Imitrex [Sumatriptan] Other (See Comments)    seizures  . Vicodin [Hydrocodone-Acetaminophen] Itching and Rash    Physical Exam: Constitutional: in no apparent distress and non-toxic  Filed Vitals:   04/05/15 1430  BP: 132/64  Pulse: 100  Temp:   Resp:    EYES: anicteric ENMT: Cardiovascular: Cor RRR and No murmurs Respiratory: clear; GI: Bowel sounds are normal, liver is not enlarged, spleen is not enlarged Musculoskeletal: peripheral pulses normal, no pedal edema, no clubbing or cyanosis Skin: negatives: no rash Hematologic: + left adenopathy in neck, tender, s/p biopsy  Lab Results  Component Value Date   WBC 7.0 04/05/2015   HGB 8.8* 04/05/2015   HCT 28.3* 04/05/2015   MCV 95.3 04/05/2015   PLT 164 04/05/2015    Lab Results  Component Value Date   CREATININE 10.31* 04/05/2015   BUN 49* 04/05/2015   NA 128* 04/05/2015   K 5.1 04/05/2015   CL 93* 04/05/2015   CO2 19* 04/05/2015    Lab Results  Component Value Date   ALT 15 03/31/2015   AST 32 03/31/2015   ALKPHOS 174* 03/31/2015     Microbiology: Recent Results (from the past 240 hour(s))  Culture, blood (routine x 2)      Status: None   Collection Time: 04/01/15  7:50 PM  Result Value Ref Range Status   Specimen Description BLOOD RIGHT ARM  Final   Special Requests IN PEDIATRIC BOTTLE 1CC  Final   Culture  Setup Time   Final    GRAM POSITIVE COCCI IN CLUSTERS AEROBIC BOTTLE ONLY CRITICAL RESULT CALLED TO, READ BACK BY AND VERIFIED WITH: A RIO 04/02/15 @ [place]    Culture  STAPHYLOCOCCUS SPECIES (COAGULASE NEGATIVE)  Final   Report Status 04/04/2015 FINAL  Final   Organism ID, Bacteria STAPHYLOCOCCUS SPECIES (COAGULASE NEGATIVE)  Final      Susceptibility   Staphylococcus species (coagulase negative) - MIC*    CIPROFLOXACIN >=8 RESISTANT Resistant     ERYTHROMYCIN >=8 RESISTANT Resistant     GENTAMICIN <=0.5 SENSITIVE Sensitive     OXACILLIN >=4 RESISTANT Resistant     TETRACYCLINE >=16 RESISTANT Resistant     VANCOMYCIN 1 SENSITIVE Sensitive     TRIMETH/SULFA 160 RESISTANT Resistant     CLINDAMYCIN <=0.25 SENSITIVE Sensitive     RIFAMPIN <=0.5 SENSITIVE Sensitive     Inducible Clindamycin NEGATIVE Sensitive     * STAPHYLOCOCCUS SPECIES (COAGULASE NEGATIVE)  Culture, blood (routine x 2)     Status: None   Collection Time: 04/01/15  7:56 PM  Result Value Ref Range Status   Specimen Description BLOOD LEFT HAND  Final   Special Requests IN PEDIATRIC BOTTLE 2CC  Final   Culture  Setup Time   Final    GRAM POSITIVE COCCI IN CLUSTERS AEROBIC BOTTLE ONLY CRITICAL RESULT CALLED TO, READ BACK BY AND VERIFIED WITH: A RIO 04/02/15 @ 39 M VESTAL    Culture   Final    STAPHYLOCOCCUS SPECIES (COAGULASE NEGATIVE) SUSCEPTIBILITIES PERFORMED ON PREVIOUS CULTURE WITHIN THE LAST 5 DAYS.    Report Status 04/04/2015 FINAL  Final  Culture, blood (routine x 2)     Status: None (Preliminary result)   Collection Time: 04/03/15 11:45 AM  Result Value Ref Range Status   Specimen Description BLOOD RIGHT HAND  Final   Special Requests IN PEDIATRIC BOTTLE 4CC  Final   Culture  Setup Time   Final    GRAM POSITIVE  COCCI IN CLUSTERS CRITICAL RESULT CALLED TO, READ BACK BY AND VERIFIED WITH: Louanne Skye 10:25 04/04/15 ([email or web address])    Culture   Final    STAPHYLOCOCCUS SPECIES (COAGULASE NEGATIVE) SUSCEPTIBILITIES PERFORMED ON PREVIOUS CULTURE WITHIN THE LAST 5 DAYS.    Report Status PENDING  Incomplete  Culture, blood (routine x 2)     Status: None (Preliminary result)   Collection Time: 04/03/15  7:35 PM  Result Value Ref Range Status   Specimen Description BLOOD RIGHT ANTECUBITAL  Final   Special Requests BOTTLES DRAWN AEROBIC ONLY [place]  Final   Culture NO GROWTH 2 DAYS  Final   Report Status PENDING  Incomplete  Tissue culture     Status: None (Preliminary result)   Collection Time: 04/04/15  5:15 PM  Result Value Ref Range Status   Specimen Description TISSUE  Final   Special Requests HEAD NECK LYMPHADENOPATHY  Final   Gram Stain   Final    NO WBC SEEN NO ORGANISMS SEEN Performed at [facility]    Culture PENDING  Incomplete   Report Status PENDING  Incomplete    COMER, Herbie [place], [place] for Infectious Disease [facility] [email or web address] [phone] pager  [phone] cell 04/05/2015, 3:47 PM

## 2015-04-05 NOTE — Progress Notes (Signed)
ANTICOAGULATION CONSULT NOTE - Follow Up Consult  Pharmacy Consult for Lovenox and Coumadin Indication: hx DVT  Allergies  Allergen Reactions  . Depakote [Divalproex Sodium] Other (See Comments)    Pt gets "delirious"  . Morphine And Related Nausea And Vomiting and Other (See Comments)    "makes me delirious"  . Penicillins Anaphylaxis    Tolerated Zosyn Dec 2014  . Tramadol Nausea And Vomiting  . Imitrex [Sumatriptan] Other (See Comments)    seizures  . Vicodin [Hydrocodone-Acetaminophen] Itching and Rash    Patient Measurements: Height: 5\' 1"  (154.9 cm) Weight: 158 lb 1.1 oz (71.7 kg) IBW/kg (Calculated) : 47.8  Vital Signs: Temp: 98.8 F (37.1 C) (11/08 1047) Temp Source: Oral (11/08 1047) BP: 136/58 mmHg (11/08 1047) Pulse Rate: 98 (11/08 1047)  Labs:  Recent Labs  04/03/15 0221 04/04/15 0355 04/05/15 0022  HGB 10.3*  --  8.3*  HCT 32.8*  --  27.5*  PLT 203  --  165  LABPROT 21.8* 19.1* 18.3*  INR 1.91* 1.61* 1.51*  CREATININE 4.94*  --  9.32*    Estimated Creatinine Clearance: 7.1 mL/min (by C-G formula based on Cr of 9.32).  Assessment: 44 yo female with  ESRD on HD TTS on coumadin pta for hx DVT 12/2014. S/P Lymph node biopsy 11/7 by IR for Lymphadenopathy of head and neck .  Coumadin resumed 11/7 with restart of Lovenox bridge.  Lovenox 70 mg SQ q24h restart but held dose last night due to patient going to TEE this AM. Now s/p TEE, I will resume Lovenox now.  Weight 71.7 kg.  Coumadin 7.5 mg po given last night.  INR = 1.51 today,  subtherapeutic, Hgb down to 8.3, pltc down 203>165K. No bleeding noted.  PTA Coumadin dose: 7.5mg  MWFSu and 5mg  TTSat  TEE 11/8: Cardilogist noted results:  LVEF >55% Mild MR, moderate TR, trivial AR. No PR No LA or LAA thrombus or mass No vegetation or mass  Goal of Therapy:  INR 2-3 Monitor platelets by anticoagulation protocol: Yes   Plan:  Give Lovenox 70 mg SQ q24h -restart now. Coumadin 7.5 mg today x1 Daily  INR   Nicole Cella, RPh Clinical Pharmacist Pager: [phone] 04/05/2015 11:17 AM

## 2015-04-06 ENCOUNTER — Encounter (HOSPITAL_COMMUNITY): Payer: Self-pay | Admitting: Cardiovascular Disease

## 2015-04-06 LAB — CBC
HEMATOCRIT: 29.5 % — AB (ref 36.0–46.0)
HEMOGLOBIN: 8.8 g/dL — AB (ref 12.0–15.0)
MCH: 28.3 pg (ref 26.0–34.0)
MCHC: 29.8 g/dL — ABNORMAL LOW (ref 30.0–36.0)
MCV: 94.9 fL (ref 78.0–100.0)
Platelets: 180 10*3/uL (ref 150–400)
RBC: 3.11 MIL/uL — AB (ref 3.87–5.11)
RDW: 17.6 % — ABNORMAL HIGH (ref 11.5–15.5)
WBC: 6.8 10*3/uL (ref 4.0–10.5)

## 2015-04-06 LAB — PROTIME-INR
INR: 2.11 — AB (ref 0.00–1.49)
PROTHROMBIN TIME: 23.5 s — AB (ref 11.6–15.2)

## 2015-04-06 LAB — BASIC METABOLIC PANEL
Anion gap: 14 (ref 5–15)
BUN: 23 mg/dL — ABNORMAL HIGH (ref 6–20)
CHLORIDE: 90 mmol/L — AB (ref 101–111)
CO2: 25 mmol/L (ref 22–32)
Calcium: 8.2 mg/dL — ABNORMAL LOW (ref 8.9–10.3)
Creatinine, Ser: 5.97 mg/dL — ABNORMAL HIGH (ref 0.44–1.00)
GFR calc non Af Amer: 8 mL/min — ABNORMAL LOW (ref >60)
GFR, EST AFRICAN AMERICAN: 9 mL/min — AB (ref >60)
Glucose, Bld: 532 mg/dL — ABNORMAL HIGH (ref 65–99)
POTASSIUM: 4.5 mmol/L (ref 3.5–5.1)
SODIUM: 129 mmol/L — AB (ref 135–145)

## 2015-04-06 LAB — GLUCOSE, CAPILLARY
GLUCOSE-CAPILLARY: 539 mg/dL — AB (ref 65–99)
GLUCOSE-CAPILLARY: 518 mg/dL — AB (ref 65–99)
GLUCOSE-CAPILLARY: 123 mg/dL — AB (ref 65–99)
Glucose-Capillary: 403 mg/dL — ABNORMAL HIGH (ref 65–99)
Glucose-Capillary: 194 mg/dL — ABNORMAL HIGH (ref 65–99)
Glucose-Capillary: 70 mg/dL (ref 65–99)

## 2015-04-06 MED ORDER — INSULIN GLARGINE 100 UNIT/ML ~~LOC~~ SOLN
4.0000 [IU] | Freq: Two times a day (BID) | SUBCUTANEOUS | Status: DC
  Administered 2015-04-06 (×2): 4 [IU] via SUBCUTANEOUS
  Filled 2015-04-06 (×3): qty 0.04

## 2015-04-06 MED ORDER — LEVOFLOXACIN IN D5W 500 MG/100ML IV SOLN
500.0000 mg | INTRAVENOUS | Status: DC
  Administered 2015-04-08: 500 mg via INTRAVENOUS
  Filled 2015-04-06: qty 100

## 2015-04-06 MED ORDER — INSULIN ASPART 100 UNIT/ML ~~LOC~~ SOLN
2.0000 [IU] | Freq: Three times a day (TID) | SUBCUTANEOUS | Status: DC
  Administered 2015-04-06 – 2015-04-12 (×8): 2 [IU] via SUBCUTANEOUS

## 2015-04-06 MED ORDER — LEVOFLOXACIN IN D5W 750 MG/150ML IV SOLN
750.0000 mg | INTRAVENOUS | Status: AC
  Administered 2015-04-06: 750 mg via INTRAVENOUS
  Filled 2015-04-06: qty 150

## 2015-04-06 MED ORDER — INSULIN ASPART 100 UNIT/ML ~~LOC~~ SOLN
5.0000 [IU] | Freq: Once | SUBCUTANEOUS | Status: AC
  Administered 2015-04-06: 5 [IU] via SUBCUTANEOUS

## 2015-04-06 MED ORDER — WARFARIN SODIUM 1 MG PO TABS
1.0000 mg | ORAL_TABLET | Freq: Once | ORAL | Status: AC
  Administered 2015-04-06: 1 mg via ORAL
  Filled 2015-04-06: qty 1

## 2015-04-06 MED ORDER — LEVOFLOXACIN IN D5W 500 MG/100ML IV SOLN: 500.0000 mg | INTRAVENOUS | Status: DC

## 2015-04-06 NOTE — Progress Notes (Signed)
[place] for Infectious Disease   Reason for visit: Follow up on fever, CoNS bacteremia  Interval History: she has a new postiive blood culture 1/2 with GPC in chains. Started on levaquin for ? Strep.  Continues on vancomycin for CoNS.  Fever yesterday and today up to 102.  No cough, no SOB.  CXR without overt opacity.   Physical Exam: Constitutional:  Filed Vitals:   04/06/15 0946  BP: 103/53  Pulse: 84  Temp: 100 F (37.8 C)  Resp: 20   patient appears in NAD Eyes: anicteric HENT: no thrush Respiratory: Normal respiratory effort; CTA B Cardiovascular: RRR  Review of Systems: Constitutional: negative for chills Cardiovascular: negative for chest pain Gastrointestinal: negative for nausea and diarrhea   Lab Results  Component Value Date   WBC 6.8 04/06/2015   HGB 8.8* 04/06/2015   HCT 29.5* 04/06/2015   MCV 94.9 04/06/2015   PLT 180 04/06/2015    Lab Results  Component Value Date   CREATININE 5.97* 04/06/2015   BUN 23* 04/06/2015   NA 129* 04/06/2015   K 4.5 04/06/2015   CL 90* 04/06/2015   CO2 25 04/06/2015    Lab Results  Component Value Date   ALT 15 03/31/2015   AST 32 03/31/2015   ALKPHOS 174* 03/31/2015     Microbiology: Recent Results (from the past 240 hour(s))  Culture, blood (routine x 2)     Status: None   Collection Time: 04/01/15  7:50 PM  Result Value Ref Range Status   Specimen Description BLOOD RIGHT ARM  Final   Special Requests IN PEDIATRIC BOTTLE 1CC  Final   Culture  Setup Time   Final    GRAM POSITIVE COCCI IN CLUSTERS AEROBIC BOTTLE ONLY CRITICAL RESULT CALLED TO, READ BACK BY AND VERIFIED WITH: A RIO 04/02/15 @ 45 M VESTAL    Culture STAPHYLOCOCCUS SPECIES (COAGULASE NEGATIVE)  Final   Report Status 04/04/2015 FINAL  Final   Organism ID, Bacteria STAPHYLOCOCCUS SPECIES (COAGULASE NEGATIVE)  Final      Susceptibility   Staphylococcus species (coagulase negative) - MIC*    CIPROFLOXACIN >=8 RESISTANT Resistant    ERYTHROMYCIN >=8 RESISTANT Resistant     GENTAMICIN <=0.5 SENSITIVE Sensitive     OXACILLIN >=4 RESISTANT Resistant     TETRACYCLINE >=16 RESISTANT Resistant     VANCOMYCIN 1 SENSITIVE Sensitive     TRIMETH/SULFA 160 RESISTANT Resistant     CLINDAMYCIN <=0.25 SENSITIVE Sensitive     RIFAMPIN <=0.5 SENSITIVE Sensitive     Inducible Clindamycin NEGATIVE Sensitive     * STAPHYLOCOCCUS SPECIES (COAGULASE NEGATIVE)  Culture, blood (routine x 2)     Status: None   Collection Time: 04/01/15  7:56 PM  Result Value Ref Range Status   Specimen Description BLOOD LEFT HAND  Final   Special Requests IN PEDIATRIC BOTTLE 2CC  Final   Culture  Setup Time   Final    GRAM POSITIVE COCCI IN CLUSTERS AEROBIC BOTTLE ONLY CRITICAL RESULT CALLED TO, READ BACK BY AND VERIFIED WITH: A RIO 04/02/15 @ 1321 M VESTAL    Culture   Final    STAPHYLOCOCCUS SPECIES (COAGULASE NEGATIVE) SUSCEPTIBILITIES PERFORMED ON PREVIOUS CULTURE WITHIN THE LAST 5 DAYS.    Report Status 04/04/2015 FINAL  Final  Culture, blood (routine x 2)     Status: None (Preliminary result)   Collection Time: 04/03/15 11:45 AM  Result Value Ref Range Status   Specimen Description BLOOD RIGHT HAND  Final  Special Requests IN PEDIATRIC BOTTLE 4CC  Final   Culture  Setup Time   Final    GRAM POSITIVE COCCI IN CLUSTERS CRITICAL RESULT CALLED TO, READ BACK BY AND VERIFIED WITH: Louanne Skye 10:25 04/04/15 ([email or web address])    Culture   Final    STAPHYLOCOCCUS SPECIES (COAGULASE NEGATIVE) SUSCEPTIBILITIES PERFORMED ON PREVIOUS CULTURE WITHIN THE LAST 5 DAYS.    Report Status PENDING  Incomplete  Culture, blood (routine x 2)     Status: None (Preliminary result)   Collection Time: 04/03/15  7:35 PM  Result Value Ref Range Status   Specimen Description BLOOD RIGHT ANTECUBITAL  Final   Special Requests BOTTLES DRAWN AEROBIC ONLY 8CC  Final   Culture NO GROWTH 3 DAYS  Final   Report Status PENDING  Incomplete  Tissue culture     Status: None  (Preliminary result)   Collection Time: 04/04/15  5:15 PM  Result Value Ref Range Status   Specimen Description TISSUE  Final   Special Requests HEAD NECK LYMPHADENOPATHY  Final   Gram Stain   Final    NO WBC SEEN NO ORGANISMS SEEN Performed at [facility]    Culture NO GROWTH Performed at [facility]   Final   Report Status PENDING  Incomplete  Culture, blood (routine x 2)     Status: None (Preliminary result)   Collection Time: 04/05/15 12:22 AM  Result Value Ref Range Status   Specimen Description BLOOD RIGHT HAND  Final   Special Requests IN PEDIATRIC BOTTLE 3CC  Final   Culture  Setup Time   Final    GRAM POSITIVE COCCI IN CHAINS IN PAIRS AEROBIC BOTTLE ONLY CRITICAL RESULT CALLED TO, READ BACK BY AND VERIFIED WITHDwaine Gale RN [id number] 04/05/15 A BROWNING    Culture   Final    GRAM POSITIVE COCCI CULTURE REINCUBATED FOR BETTER GROWTH    Report Status PENDING  Incomplete  Culture, blood (routine x 2)     Status: None (Preliminary result)   Collection Time: 04/05/15 12:37 AM  Result Value Ref Range Status   Specimen Description BLOOD RIGHT HAND  Final   Special Requests BOTTLES DRAWN AEROBIC AND ANAEROBIC 4CC  Final   Culture NO GROWTH 1 DAY  Final   Report Status PENDING  Incomplete    Impression:  1. CoNS bacteremia, persistently positive.  2. Fever 3. Neck adenopathy 4. GPC blood culture in chains, I suspect this is the same CoNS, though will wait for speciation.  On levaquin for it.   Plan: 1. Continue with vancomycin 2. Await biopsy 3. Will follow speication

## 2015-04-06 NOTE — Progress Notes (Signed)
ANTICOAGULATION CONSULT NOTE - Follow Up Consult  Pharmacy Consult for Lovenox and Coumadin Indication: hx LLE  DVT (12/2014)   Allergies  Allergen Reactions  . Depakote [Divalproex Sodium] Other (See Comments)    Pt gets "delirious"  . Morphine And Related Nausea And Vomiting and Other (See Comments)    "makes me delirious"  . Penicillins Anaphylaxis    Tolerated Zosyn Dec 2014  . Tramadol Nausea And Vomiting  . Imitrex [Sumatriptan] Other (See Comments)    seizures  . Vicodin [Hydrocodone-Acetaminophen] Itching and Rash    Patient Measurements: Height: 5\' 1"  (154.9 cm) Weight: 146 lb 9.7 oz (66.5 kg) IBW/kg (Calculated) : 47.8  Vital Signs: Temp: 100 F (37.8 C) (11/09 0946) Temp Source: Oral (11/09 0946) BP: 103/53 mmHg (11/09 0946) Pulse Rate: 84 (11/09 0946)  Labs:  Recent Labs  04/04/15 0355  04/05/15 0022 04/05/15 1112 04/06/15 0430  HGB  --   < > 8.3* 8.8* 8.8*  HCT  --   --  27.5* 28.3* 29.5*  PLT  --   --  165 164 180  LABPROT 19.1*  --  18.3*  --  23.5*  INR 1.61*  --  1.51*  --  2.11*  CREATININE  --   --  9.32* 10.31* 5.97*  < > = values in this interval not displayed.  Estimated Creatinine Clearance: 10.7 mL/min (by C-G formula based on Cr of 5.97).  Assessment: 44 yo female with  ESRD on HD TTS on coumadin pta for hx LLE DVT 12/2014. S/P Lymph node biopsy 11/7 by IR for Lymphadenopathy of head and neck .  Coumadin resumed 11/7 with restart of Lovenox bridge.  Today 11/9, the INR = 2.11 from 1.51 yesterday after 2 days of 7.5mg  coumadin given. Hgb stable 8.8, pltc stable 164>180K , no bleeding noted. Levaquin started today which may increase coumadin effect. INR therapeutic today but significantly increased today, likely due to ABX/warfarin DDI with ?vancomcyin and now on levofloxacin plus decreased PO intake.  PTA Coumadin dose: 7.5mg  MWFSu and 5mg  TTSat  TEE 11/8:  No LA or LAA thrombus or mass.  No vegetation or mass  Goal of Therapy:  INR  2-3 Monitor platelets by anticoagulation protocol: Yes   Plan:   I held the Lovenox dose today due to INR >2 today - f/u w/MD for DC if INR remains >2 tomorrow. Coumadin reduced to 1mg  today x1  Daily INR. F/u CBC   Nicole Cella, RPh Clinical Pharmacist Pager: [phone] 04/06/2015 3:09 PM

## 2015-04-06 NOTE — Progress Notes (Signed)
ANTIBIOTIC CONSULT NOTE - INITIAL  Pharmacy Consult for Levaquin Indication: HCAP  Allergies  Allergen Reactions  . Depakote [Divalproex Sodium] Other (See Comments)    Pt gets "delirious"  . Morphine And Related Nausea And Vomiting and Other (See Comments)    "makes me delirious"  . Penicillins Anaphylaxis    Tolerated Zosyn Dec 2014  . Tramadol Nausea And Vomiting  . Imitrex [Sumatriptan] Other (See Comments)    seizures  . Vicodin [Hydrocodone-Acetaminophen] Itching and Rash    Patient Measurements: Height: 5\' 1"  (154.9 cm) Weight: 146 lb 9.7 oz (66.5 kg) IBW/kg (Calculated) : 47.8   Vital Signs: Temp: 100 F (37.8 C) (11/09 0946) Temp Source: Oral (11/09 0946) BP: 103/53 mmHg (11/09 0946) Pulse Rate: 84 (11/09 0946) Intake/Output from previous day: 11/08 0701 - 11/09 0700 In: 435 [P.O.:360; I.V.:75] Out: 3200  Intake/Output from this shift: Total I/O In: 240 [P.O.:240] Out: -   Labs:  Recent Labs  04/05/15 0022 04/05/15 1112 04/06/15 0430  WBC 6.8 7.0 6.8  HGB 8.3* 8.8* 8.8*  PLT 165 164 180  CREATININE 9.32* 10.31* 5.97*   Estimated Creatinine Clearance: 10.7 mL/min (by C-G formula based on Cr of 5.97).  Recent Labs  04/05/15 1301  VANCORANDOM 22     Microbiology: Recent Results (from the past 720 hour(s))  Culture, blood (routine x 2)     Status: None   Collection Time: 04/01/15  7:50 PM  Result Value Ref Range Status   Specimen Description BLOOD RIGHT ARM  Final   Special Requests IN PEDIATRIC BOTTLE 1CC  Final   Culture  Setup Time   Final    GRAM POSITIVE COCCI IN CLUSTERS AEROBIC BOTTLE ONLY CRITICAL RESULT CALLED TO, READ BACK BY AND VERIFIED WITH: A RIO 04/02/15 @ 75 M VESTAL    Culture STAPHYLOCOCCUS SPECIES (COAGULASE NEGATIVE)  Final   Report Status 04/04/2015 FINAL  Final   Organism ID, Bacteria STAPHYLOCOCCUS SPECIES (COAGULASE NEGATIVE)  Final      Susceptibility   Staphylococcus species (coagulase negative) - MIC*     CIPROFLOXACIN >=8 RESISTANT Resistant     ERYTHROMYCIN >=8 RESISTANT Resistant     GENTAMICIN <=0.5 SENSITIVE Sensitive     OXACILLIN >=4 RESISTANT Resistant     TETRACYCLINE >=16 RESISTANT Resistant     VANCOMYCIN 1 SENSITIVE Sensitive     TRIMETH/SULFA 160 RESISTANT Resistant     CLINDAMYCIN <=0.25 SENSITIVE Sensitive     RIFAMPIN <=0.5 SENSITIVE Sensitive     Inducible Clindamycin NEGATIVE Sensitive     * STAPHYLOCOCCUS SPECIES (COAGULASE NEGATIVE)  Culture, blood (routine x 2)     Status: None   Collection Time: 04/01/15  7:56 PM  Result Value Ref Range Status   Specimen Description BLOOD LEFT HAND  Final   Special Requests IN PEDIATRIC BOTTLE 2CC  Final   Culture  Setup Time   Final    GRAM POSITIVE COCCI IN CLUSTERS AEROBIC BOTTLE ONLY CRITICAL RESULT CALLED TO, READ BACK BY AND VERIFIED WITH: A RIO 04/02/15 @ 1321 M VESTAL    Culture   Final    STAPHYLOCOCCUS SPECIES (COAGULASE NEGATIVE) SUSCEPTIBILITIES PERFORMED ON PREVIOUS CULTURE WITHIN THE LAST 5 DAYS.    Report Status 04/04/2015 FINAL  Final  Culture, blood (routine x 2)     Status: None (Preliminary result)   Collection Time: 04/03/15 11:45 AM  Result Value Ref Range Status   Specimen Description BLOOD RIGHT HAND  Final   Special Requests IN PEDIATRIC BOTTLE 4CC  Final   Culture  Setup Time   Final    GRAM POSITIVE COCCI IN CLUSTERS CRITICAL RESULT CALLED TO, READ BACK BY AND VERIFIED WITH: Louanne Skye 10:25 04/04/15 ([email or web address])    Culture   Final    STAPHYLOCOCCUS SPECIES (COAGULASE NEGATIVE) SUSCEPTIBILITIES PERFORMED ON PREVIOUS CULTURE WITHIN THE LAST 5 DAYS.    Report Status PENDING  Incomplete  Culture, blood (routine x 2)     Status: None (Preliminary result)   Collection Time: 04/03/15  7:35 PM  Result Value Ref Range Status   Specimen Description BLOOD RIGHT ANTECUBITAL  Final   Special Requests BOTTLES DRAWN AEROBIC ONLY 8CC  Final   Culture NO GROWTH 3 DAYS  Final   Report Status PENDING  Incomplete   Tissue culture     Status: None (Preliminary result)   Collection Time: 04/04/15  5:15 PM  Result Value Ref Range Status   Specimen Description TISSUE  Final   Special Requests HEAD NECK LYMPHADENOPATHY  Final   Gram Stain   Final    NO WBC SEEN NO ORGANISMS SEEN Performed at [facility]    Culture NO GROWTH Performed at [facility]   Final   Report Status PENDING  Incomplete  Culture, blood (routine x 2)     Status: None (Preliminary result)   Collection Time: 04/05/15 12:22 AM  Result Value Ref Range Status   Specimen Description BLOOD RIGHT HAND  Final   Special Requests IN PEDIATRIC BOTTLE 3CC  Final   Culture  Setup Time   Final    GRAM POSITIVE COCCI IN CHAINS IN PAIRS AEROBIC BOTTLE ONLY CRITICAL RESULT CALLED TO, READ BACK BY AND VERIFIED WITHDwaine Gale RN [id number] 04/05/15 A BROWNING    Culture   Final    GRAM POSITIVE COCCI CULTURE REINCUBATED FOR BETTER GROWTH    Report Status PENDING  Incomplete  Culture, blood (routine x 2)     Status: None (Preliminary result)   Collection Time: 04/05/15 12:37 AM  Result Value Ref Range Status   Specimen Description BLOOD RIGHT HAND  Final   Special Requests BOTTLES DRAWN AEROBIC AND ANAEROBIC 4CC  Final   Culture NO GROWTH 1 DAY  Final   Report Status PENDING  Incomplete    Medical History: Past Medical History  Diagnosis Date  . Hyperlipidemia   . Alopecia   . Obesity   . Iron deficiency   . RLS (restless legs syndrome)   . Injury of conjunctiva and corneal abrasion of right eye without foreign body   . Cellulitis   . Anemia   . Blood transfusion 2004    "when I had my transplant"  . Migraine   . Hyperparathyroidism, secondary ([place])   . Diabetic retinopathy   . Hypertension     takes Metoprolol and Exforge daily  . Depression     takes Klonopin nightly  . Diabetes mellitus type 1 (HCC)     Pt states diagnosed at age 42 with prior episodes of DKA. S/p pancreatic transplant   . GERD  (gastroesophageal reflux disease)     takes Protonix daily  . Bronchitis   . Migraine   . Asthma   . Emphysema of lung ([place])   . Angina     none in past 2 years.  . Stomach pain     Hx: of chronic  . Pneumonia 2012    "double"  . Dialysis patient ([place])     tues,thurs,sat  . 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  Surgery was done at [facility].     . DKA (diabetic ketoacidoses) ([place]) 12/01/2014  . Diastolic CHF, acute on chronic ([place]) 01/02/2015  . Stroke [facility]) 03/2014    ? TIA  . ESRD (end stage renal disease) [facility])     S/p pancreatic and kidney transplant, but back on HD 2008, dialysis at [facility] [place], Sat    Medications:  Scheduled:  . calcium acetate  667 mg Oral BID WC  . clonazePAM  1 mg Oral QHS  . [START ON 04/07/2015] darbepoetin (ARANESP) injection - DIALYSIS  200 mcg Intravenous Q Thu-HD  . dextromethorphan-guaiFENesin  1 tablet Oral BID  . dextrose  1 ampule Intravenous Once  . doxercalciferol  1 mcg Intravenous Q T,Th,Sa-HD  . enoxaparin  70 mg Subcutaneous Q24H  . feeding supplement (NEPRO CARB STEADY)  237 mL Oral BID BM  . gabapentin  100 mg Oral Daily  . guaiFENesin  600 mg Oral BID  . insulin aspart  0-9 Units Subcutaneous TID WC  . insulin aspart  2 Units Subcutaneous TID WC  . insulin glargine  4 Units Subcutaneous BID  . [START ON 04/08/2015] levofloxacin (LEVAQUIN) IV  500 mg Intravenous Q48H  . lidocaine  1 patch Transdermal Q24H  . metoCLOPramide  10 mg Oral TID AC & HS  . montelukast  10 mg Oral QHS  . multivitamin  1 tablet Oral Daily  . pantoprazole  40 mg Oral QHS  . simvastatin  20 mg Oral QHS  . vancomycin  750 mg Intravenous Q T,Th,Sa-HD  . Warfarin - Pharmacist Dosing Inpatient   Does not apply q1800    Assessment: 44 y.o female with new postiive blood culture 1/2 with GPC in chains. Currently on IV vancomycin for CoNS bacteremia, persistently positive. Repeat BCX (aerobic bottle) growing GPC in chains/ pairs concerning for Strep. Pharmacy consulted to dose levofloxacin for HCAP.strep pneumo coverage.  ID notes pt with No cough, no SOB. CXR without overt opacity. Continues on IV vancomycin , received 1.5 gm load on 11/5 after HD, then 750-mg given yesterday 11/8 after HD. Diffuse lymphadenopathy,neck adenopathy- s/p biopsy on 11/8 awaiting results.   Goal of Therapy:  Eradicate infection. Appropriate antibiotic dosing for renal function.  Plan:  Levofloxacin 750mg  IV x1 given, then 500mg  IV q48h Continue Vancomycin 750mg  IV qHD TTS- check pre HD vanc level ~ Saturday. 11/ 12.  Follow up repeat blood cultures and speciation    Thank you for allowing pharmacy to be part of this patients care team. Nicole Cella, RPh Clinical Pharmacist Pager: [phone] 04/06/2015,2:35 PM

## 2015-04-06 NOTE — Progress Notes (Signed)
Pt cbg 539, paged Dr. Marlowe Sax, increased Lantus to 4 units BID. Will give sliding scale insulin. Will continue to monitor.   Penni Bombard, RN 04/06/2015 7:46 AM

## 2015-04-06 NOTE — Progress Notes (Signed)
Inpatient Diabetes Program Recommendations  AACE/ADA: New Consensus Statement on Inpatient Glycemic Control (2015)  Target Ranges:  Prepandial:   less than 140 mg/dL      Peak postprandial:   less than 180 mg/dL (1-2 hours)      Critically ill patients:  140 - 180 mg/dL   Review of Glycemic Control:  Results for [name], [name] (MRN [id number]) as of 04/06/2015 10:50  Ref. Range 04/05/2015 08:15 04/05/2015 17:37 04/05/2015 21:16 04/06/2015 07:28 04/06/2015 09:47  Glucose-Capillary Latest Ref Range: 65-99 mg/dL 232 (H) 184 (H) 313 (H) 539 (H) 518 (H)   Labile blood glucoses.   Outpatient Diabetes medications:  Lantus 7 units bid, Humalog 6 units bid Current orders for Inpatient glycemic control:  Lantus 4 units bid, Novolog 2 units tid with meals, Novolog sensitive correction tid with meals  Inpatient Diabetes Program Recommendations:   Please consider reducing Novolog correction to custom scale.  Consider 1 unit for every 100 mg/dL greater than 150 mg/dL.   151-250- 1 unit 251-350-2 units 351-450-3 units >451- 4 units  Thanks, Adah Perl, RN, BC-ADM Inpatient Diabetes Coordinator Pager [phone] (8a-5p)

## 2015-04-06 NOTE — Progress Notes (Signed)
Subjective: Patient was seen and examined at bedside this morning. Temperature this morning 102 F. Patient states her cough and chest congestion are better. Denies any chest pain/sob.n/v. Currently on 2L O2 via [place]. No other complaints.   Objective: Vital signs in last 24 hours: Filed Vitals:   04/05/15 2216 04/06/15 0520 04/06/15 0729 04/06/15 0946  BP: 96/42 138/55  103/53  Pulse:  95  84  Temp:  102 F (38.9 C) 101.5 F (38.6 C) 100 F (37.8 C)  TempSrc:  Oral Oral Oral  Resp:  18  20  Height:      Weight:      SpO2:  90%  94%   Weight change: -4 lb 6.6 oz (-2 kg)  Intake/Output Summary (Last 24 hours) at 04/06/15 1059 Last data filed at 04/06/15 0900  Gross per 24 hour  Intake    600 ml  Output   3200 ml  Net  -2600 ml   Physical Exam: Gen: AAOx3, NAD HEENT: bilateral enlarged supraclavicular LN, tender to palpation on right side CVS: RRR, S1 and S2 appreciated, systolic murmur   Lungs: diffuse rhonchi (improved since yesterday) Abd: soft, NT, ND. +BS.  Ext: no edema Skin: warm and dry. No rashes noted. Dialysis access graft on right thigh does not appear infected.    Lab Results: Basic Metabolic Panel:  Recent Labs Lab 04/03/15 0221  04/05/15 1112 04/06/15 0430  NA 132*  < > 128* 129*  K 4.9  < > 5.1 4.5  CL 94*  < > 93* 90*  CO2 24  < > 19* 25  GLUCOSE 50*  < > 254* 532*  BUN 22*  < > 49* 23*  CREATININE 4.94*  < > 10.31* 5.97*  CALCIUM 8.8*  < > 8.2* 8.2*  PHOS 2.5  --  4.8*  --   < > = values in this interval not displayed. Liver Function Tests:  Recent Labs Lab 03/30/15 1944 03/31/15 1555 04/03/15 0221 04/05/15 1112  AST 15 32  --   --   ALT 10* 15  --   --   ALKPHOS 145* 174*  --   --   BILITOT 0.7 0.8  --   --   PROT 7.7 7.7  --   --   ALBUMIN 2.8* 2.8* 2.8* 2.5*   CBC:  Recent Labs Lab 04/02/15 0839 04/03/15 0221  04/05/15 1112 04/06/15 0430  WBC  --  9.5  < > 7.0 6.8  NEUTROABS 5.8 6.4  --   --   --   HGB  --  10.3*  < >  8.8* 8.8*  HCT  --  32.8*  < > 28.3* 29.5*  MCV  --  94.3  < > 95.3 94.9  PLT  --  203  < > 164 180  < > = values in this interval not displayed. CBG:  Recent Labs Lab 04/04/15 2148 04/05/15 0815 04/05/15 1737 04/05/15 2116 04/06/15 0728 04/06/15 0947  GLUCAP 218* 232* 184* 313* 539* 518*   Hemoglobin A1C:  Recent Labs Lab 03/31/15 1912  HGBA1C 10.0*   Coagulation:  Recent Labs Lab 04/03/15 0221 04/04/15 0355 04/05/15 0022 04/06/15 0430  LABPROT 21.8* 19.1* 18.3* 23.5*  INR 1.91* 1.61* 1.51* 2.11*    Micro Results: Recent Results (from the past 240 hour(s))  Culture, blood (routine x 2)     Status: None   Collection Time: 04/01/15  7:50 PM  Result Value Ref Range Status   Specimen Description  BLOOD RIGHT ARM  Final   Special Requests IN PEDIATRIC BOTTLE 1CC  Final   Culture  Setup Time   Final    GRAM POSITIVE COCCI IN CLUSTERS AEROBIC BOTTLE ONLY CRITICAL RESULT CALLED TO, READ BACK BY AND VERIFIED WITH: A RIO 04/02/15 @ 25 M VESTAL    Culture STAPHYLOCOCCUS SPECIES (COAGULASE NEGATIVE)  Final   Report Status 04/04/2015 FINAL  Final   Organism ID, Bacteria STAPHYLOCOCCUS SPECIES (COAGULASE NEGATIVE)  Final      Susceptibility   Staphylococcus species (coagulase negative) - MIC*    CIPROFLOXACIN >=8 RESISTANT Resistant     ERYTHROMYCIN >=8 RESISTANT Resistant     GENTAMICIN <=0.5 SENSITIVE Sensitive     OXACILLIN >=4 RESISTANT Resistant     TETRACYCLINE >=16 RESISTANT Resistant     VANCOMYCIN 1 SENSITIVE Sensitive     TRIMETH/SULFA 160 RESISTANT Resistant     CLINDAMYCIN <=0.25 SENSITIVE Sensitive     RIFAMPIN <=0.5 SENSITIVE Sensitive     Inducible Clindamycin NEGATIVE Sensitive     * STAPHYLOCOCCUS SPECIES (COAGULASE NEGATIVE)  Culture, blood (routine x 2)     Status: None   Collection Time: 04/01/15  7:56 PM  Result Value Ref Range Status   Specimen Description BLOOD LEFT HAND  Final   Special Requests IN PEDIATRIC BOTTLE 2CC  Final   Culture   Setup Time   Final    GRAM POSITIVE COCCI IN CLUSTERS AEROBIC BOTTLE ONLY CRITICAL RESULT CALLED TO, READ BACK BY AND VERIFIED WITH: A RIO 04/02/15 @ 93 M VESTAL    Culture   Final    STAPHYLOCOCCUS SPECIES (COAGULASE NEGATIVE) SUSCEPTIBILITIES PERFORMED ON PREVIOUS CULTURE WITHIN THE LAST 5 DAYS.    Report Status 04/04/2015 FINAL  Final  Culture, blood (routine x 2)     Status: None (Preliminary result)   Collection Time: 04/03/15 11:45 AM  Result Value Ref Range Status   Specimen Description BLOOD RIGHT HAND  Final   Special Requests IN PEDIATRIC BOTTLE 4CC  Final   Culture  Setup Time   Final    GRAM POSITIVE COCCI IN CLUSTERS CRITICAL RESULT CALLED TO, READ BACK BY AND VERIFIED WITH: Louanne Skye 10:25 04/04/15 ([email or web address])    Culture   Final    STAPHYLOCOCCUS SPECIES (COAGULASE NEGATIVE) SUSCEPTIBILITIES PERFORMED ON PREVIOUS CULTURE WITHIN THE LAST 5 DAYS.    Report Status PENDING  Incomplete  Culture, blood (routine x 2)     Status: None (Preliminary result)   Collection Time: 04/03/15  7:35 PM  Result Value Ref Range Status   Specimen Description BLOOD RIGHT ANTECUBITAL  Final   Special Requests BOTTLES DRAWN AEROBIC ONLY 8CC  Final   Culture NO GROWTH 2 DAYS  Final   Report Status PENDING  Incomplete  Tissue culture     Status: None (Preliminary result)   Collection Time: 04/04/15  5:15 PM  Result Value Ref Range Status   Specimen Description TISSUE  Final   Special Requests HEAD NECK LYMPHADENOPATHY  Final   Gram Stain   Final    NO WBC SEEN NO ORGANISMS SEEN Performed at [facility]    Culture NO GROWTH Performed at [facility]   Final   Report Status PENDING  Incomplete  Culture, blood (routine x 2)     Status: None (Preliminary result)   Collection Time: 04/05/15 12:22 AM  Result Value Ref Range Status   Specimen Description BLOOD RIGHT HAND  Final   Special Requests IN PEDIATRIC BOTTLE 3CC  Final   Culture  Setup Time   Final    GRAM  POSITIVE COCCI IN CHAINS IN PAIRS AEROBIC BOTTLE ONLY CRITICAL RESULT CALLED TO, READ BACK BY AND VERIFIED WITHDwaine Gale RN [id number] 04/05/15 A BROWNING    Culture   Final    GRAM POSITIVE COCCI CULTURE REINCUBATED FOR BETTER GROWTH    Report Status PENDING  Incomplete   Studies/Results: US Biopsy  04/05/2015  CLINICAL DATA:  45 year old female with a history of head and neck lymphadenopathy EXAM: ULTRASOUND GUIDED CORE BIOPSY OF HEAD NECK LYMPH NODES MEDICATIONS: 1.0 mg IV Versed; 50 mcg IV Fentanyl Total Moderate Sedation Time: 20 PROCEDURE: The procedure, risks, benefits, and alternatives were explained to the patient. Questions regarding the procedure were encouraged and answered. The patient understands and consents to the procedure. Ultrasound survey of the left neck was performed with images stored and sent to PACs. The left neck was prepped with Betadine in a sterile fashion, and a sterile drape was applied covering the operative field. A sterile gown and sterile gloves were used for the procedure. Local anesthesia was provided with 1% Lidocaine. Once the patient is prepped and draped sterilely, the skin and subcutaneous tissues were generously infiltrated with 1% lidocaine without epinephrine. A small stab incision was made, an using ultrasound guidance, several separate 18 gauge core biopsy were retrieved of enlarged lymph node of the left cervical region. Final images demonstrate no complicating features. Patient tolerated the procedure well and remained hemodynamically stable throughout. No complications encountered and no significant blood loss encountered. COMPLICATIONS: None. FINDINGS: Ultrasound survey demonstrates borderline enlarged lymph node of the left head and neck, with the usual architecture maintained. Images during the case demonstrate multiple core biopsy with the tip of the needle/biopsy device through the node. IMPRESSION: Status post ultrasound-guided biopsy of left cervical  lymph node. Sample was sent for culture, cytology, and pathology. Signed, [name]. [name], DO [facility] Radiology Electronically Signed   By: Corrie Mckusick D.O.   On: 04/05/2015 08:09   Medications: I have reviewed the patient's current medications. Scheduled Meds: . calcium acetate  667 mg Oral BID WC  . clonazePAM  1 mg Oral QHS  . [START ON 04/07/2015] darbepoetin (ARANESP) injection - DIALYSIS  200 mcg Intravenous Q Thu-HD  . dextromethorphan-guaiFENesin  1 tablet Oral BID  . dextrose  1 ampule Intravenous Once  . doxercalciferol  1 mcg Intravenous Q T,Th,Sa-HD  . enoxaparin  70 mg Subcutaneous Q24H  . feeding supplement (NEPRO CARB STEADY)  237 mL Oral BID BM  . gabapentin  100 mg Oral Daily  . guaiFENesin  600 mg Oral BID  . insulin aspart  0-9 Units Subcutaneous TID WC  . insulin aspart  2 Units Subcutaneous TID WC  . insulin glargine  4 Units Subcutaneous BID  . [START ON 04/08/2015] levofloxacin (LEVAQUIN) IV  500 mg Intravenous Q48H  . levofloxacin (LEVAQUIN) IV  750 mg Intravenous STAT  . lidocaine  1 patch Transdermal Q24H  . metoCLOPramide  10 mg Oral TID AC & HS  . montelukast  10 mg Oral QHS  . multivitamin  1 tablet Oral Daily  . pantoprazole  40 mg Oral QHS  . simvastatin  20 mg Oral QHS  . vancomycin  750 mg Intravenous Q T,Th,Sa-HD  . 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 Also got CT abdomen which shows small ascites, some cirrhotic changes of liver, diffuse stomach wall thickening concerning for gastritis vs neoplastic involvement (gastric carcinoma vs lymphoma?). HIV and LDH neg. LN biopsy was done by IR, results pending.  -F/u biopsy results   -Oxycodone 5 mg q6 prn pain    -Lidoderm patch   Fever - Temperature 102 F this am. BCX (aerobic bottle) growing GPC in chains/ pairs concerning for Strep. Vancomycin started on 11/5. TEE did not show any vegetations making endocarditis less likely. ID has been consulted and is on board. They believe differential for fever includes infection vs tumor fever if patient does indeed have lymphoma/ mets. Skin was examined today and no rashes noted. Dialysis graft site does not appear infected. -cont vancomycin with HD -Started on IV Levaquin 750 mg qd for strep pneumo coverage -Tylenol prn  - f/up repeat BCX results  Cough: Patient was also coughing and lung exam remarkable for diffuse rhonchi (improved since yesterday). Currently not complaining of any SOB but satting 90% on 2L O2 via [place]. CXR on 11/7 consistent with atelectasis/ mild interstitial edema/ infection. BCX (aerobic bottle) growing GPC in chains/ pairs concerning for Strep. -Antibiotics as above  -continue Mucinex-DM for congestion  -Albuterol nebulizer prn -Singulair 10 mg daily -continue Incentive spirometer -encourage ambulation (PT on board)  Severe hyperglycemia with DM Type I - Patient's blood glucose continues to be elevated (>500) since she has started eating. She was on Lantus 2u BID and SSI-S.  -Continue Lantus to 4 u BID -Added Novolog 2 u TID to regimen -Continue sensitive-SSI  Pseudohyponatremia - monitor with insulin regimen.  H/o LLE DVT (01/19/2015):  -Coumadin with Lovenox bridge as per pharmacy   HFpEF: Stable. EE showing LVEF  >55%, mild MR, moderate TR, and trivial AR. No PR.  -Monitor   ESRD on HD (TTS): New left AVG in 12/2014. Went for HD yesterday.  - appreciate nephro recs.   HTN: BP stable. Continue holding home Amlodipine 10 mg qd, Metoprolol 50 mg qd for now.   HLD: continue Zocor 20 mg qd  GERD: Protonix 40 mg qd   Insomnia: continue Ambien qhs    Code: Full  Dispo: Disposition is deferred at this time, awaiting improvement of current medical problems.  Anticipated discharge in approximately 0 day(s).   The patient does have a current PCP [name], MD) and does need an [facility] hospital follow-up appointment after discharge.  The patient does not have transportation limitations that hinder transportation to clinic appointments.  .Services Needed at time of discharge: Y = Yes, Blank = No PT:   OT:   RN:   Equipment:   Other:     LOS: 6 days   Shela Leff, MD 04/06/2015, 10:59 AM

## 2015-04-06 NOTE — Progress Notes (Signed)
[facility] Progress Note  Assessment/Plan: 1. R chest and neck pain - soft tissue mass R chest and lymphadenopathy by CT. s/p LN biopsy by IR pending; tissue culture no growth so far 2. MRSE bacteremia 11/4 2/2 + on Vanc - repeat cultures 11/6 1/2 coag neg staph- with peristant fevers- Tmax 102.5  11/8 11/8 -BC redrawn 11/8 1/2 + gram + cocci. Continue on Vanc with therapeutic levels 3. ESRD TTS - HD tomorrow - using right thigh graft for now 4. HTN/volume cont amlod/ MTP; UF 2.5 on Saturday- with post weight 67.2 Net UF Tuesday 3.2 with post weight  66.3 5. Anemia max ESA Hgb 10.3 down to 8.3; on Aranesp/Fe 6. MBD cont hect, binders - last P 2.5 - if still low on recheck today, will hold 7. Chronic coumadin for DVT - [facility] managing as OP 8. DM per primary - BS labile 9. Nutrition alb 2.8 - renal carb mod/renavit- hasn't eaten much in the last 24- 48hours - mostly [place], PA-C [place] 04/06/2015,9:20 AM  LOS: 6 days   Subjective:   Up and about in room.  Waiting on bx results.  Scared.  Objective Filed Vitals:   04/05/15 2119 04/05/15 2216 04/06/15 0520 04/06/15 0729  BP: 85/37 96/42 138/55   Pulse: 82  95   Temp: 98.8 F (37.1 C)  102 F (38.9 C) 101.5 F (38.6 C)  TempSrc: Oral  Oral Oral  Resp: 18  18   Height:      Weight: 66.5 kg (146 lb 9.7 oz)     SpO2: 92%  90%    Physical Exam on room air General: NAD, facial puffiness Heart: RRR Lungs: grossly clear without rales, better than yesterday Abdomen: soft Extremities: no lE edema Dialysis Access:  Bilateral thigh grafts patent  Dialysis Orders: Center: [facility] 3.75 hr 400/600 2 K 2.5 Ca right thigh AVGG left thigh AVGG Hectorol 1 venofer 100 x 2 then 50 per week, Mircera 225 - started 11/3 - last got 200 10/6  Additional Objective Labs: Basic Metabolic Panel:  Recent Labs Lab 04/03/15 0221 04/05/15 0022 04/05/15 1112 04/06/15 0430  NA 132*  128* 128* 129*  K 4.9 4.6 5.1 4.5  CL 94* 92* 93* 90*  CO2 24 23 19* 25  GLUCOSE 50* 238* 254* 532*  BUN 22* 42* 49* 23*  CREATININE 4.94* 9.32* 10.31* 5.97*  CALCIUM 8.8* 8.2* 8.2* 8.2*  PHOS 2.5  --  4.8*  --    Liver Function Tests:  Recent Labs Lab 03/30/15 1944 03/31/15 1555 04/03/15 0221 04/05/15 1112  AST 15 32  --   --   ALT 10* 15  --   --   ALKPHOS 145* 174*  --   --   BILITOT 0.7 0.8  --   --   PROT 7.7 7.7  --   --   ALBUMIN 2.8* 2.8* 2.8* 2.5*   CBC:  Recent Labs Lab 03/30/15 1944  04/02/15 0711 04/02/15 0839 04/03/15 0221 04/05/15 0022 04/05/15 1112 04/06/15 0430  WBC 6.5  < > 8.0  --  9.5 6.8 7.0 6.8  NEUTROABS 4.9  --   --  5.8 6.4  --   --   --   HGB 8.9*  < > 8.6*  --  10.3* 8.3* 8.8* 8.8*  HCT 29.9*  < > 28.1*  --  32.8* 27.5* 28.3* 29.5*  MCV 94.9  < > 93.4  --  94.3 93.9 95.3 94.9  PLT 159  < > 167  --  203 165 164 180  < > = values in this interval not displayed. Blood Culture    Component Value Date/Time   SDES BLOOD RIGHT HAND 04/05/2015 0022   SPECREQUEST IN PEDIATRIC BOTTLE 3CC 04/05/2015 0022   CULT  04/05/2015 0022    GRAM POSITIVE COCCI CULTURE REINCUBATED FOR BETTER GROWTH    REPTSTATUS PENDING 04/05/2015 0022   CBG:  Recent Labs Lab 04/04/15 2148 04/05/15 0815 04/05/15 1737 04/05/15 2116 04/06/15 0728  GLUCAP 218* 232* 184* 313* 539*  Medications:   . calcium acetate  667 mg Oral BID WC  . clonazePAM  1 mg Oral QHS  . [START ON 04/07/2015] darbepoetin (ARANESP) injection - DIALYSIS  200 mcg Intravenous Q Thu-HD  . dextromethorphan-guaiFENesin  1 tablet Oral BID  . dextrose  1 ampule Intravenous Once  . doxercalciferol  1 mcg Intravenous Q T,Th,Sa-HD  . enoxaparin  70 mg Subcutaneous Q24H  . feeding supplement (NEPRO CARB STEADY)  237 mL Oral BID BM  . gabapentin  100 mg Oral Daily  . guaiFENesin  600 mg Oral BID  . insulin aspart  0-9 Units Subcutaneous TID WC  . insulin glargine  4 Units Subcutaneous BID  .  [START ON 04/07/2015] levofloxacin (LEVAQUIN) IV  500 mg Intravenous Q T,Th,Sa-HD  . levofloxacin (LEVAQUIN) IV  750 mg Intravenous STAT  . lidocaine  1 patch Transdermal Q24H  . metoCLOPramide  10 mg Oral TID AC & HS  . montelukast  10 mg Oral QHS  . multivitamin  1 tablet Oral Daily  . pantoprazole  40 mg Oral QHS  . simvastatin  20 mg Oral QHS  . vancomycin  750 mg Intravenous Q T,Th,Sa-HD  . Warfarin - Pharmacist Dosing Inpatient   Does not apply [phone]

## 2015-04-06 NOTE — Evaluation (Signed)
Physical Therapy Evaluation Patient Details Name: [name] MRN: [id number] DOB: [birth year] Today's Date: 04/06/2015   History of Present Illness  Patient is a 44 y.o female with PMH of HTN, DM type 1 with multiple admissions for DKA, DVT on coumadin, GERD, asthma, ESRD s/p failed renal transplant. Admitted for Diffuse lymphadenopathy.  Clinical Impression  Pt admitted with the above complications. Pt currently with functional limitations due to the deficits listed below (see PT Problem List). Demonstrates instability with gait requiring min assist for balance. SpO2 dropped to 87% on room air with ambulation (94% at rest) and improves to upper 90s with 2L supplemental O2 at rest. States she can arrange as much assistance as needed from friends at d/c. Pt will benefit from skilled PT to increase their independence and safety with mobility to allow discharge to the venue listed below.       Follow Up Recommendations Home health PT;Supervision for mobility/OOB    Equipment Recommendations   (TBD)    Recommendations for Other Services OT consult     Precautions / Restrictions Precautions Precautions: Fall Restrictions Weight Bearing Restrictions: No      Mobility  Bed Mobility Overal bed mobility: Modified Independent             General bed mobility comments: extra time  Transfers Overall transfer level: Needs assistance Equipment used: None Transfers: Sit to/from Stand Sit to Stand: Supervision         General transfer comment: supervision for safety. VC for technique. moderate sway upon standing but able to self correct using back of knees on bed for support. Declines use of RW.  Ambulation/Gait Ambulation/Gait assistance: Min assist Ambulation Distance (Feet): 100 Feet Assistive device: None Gait Pattern/deviations: Step-through pattern;Decreased stride length;Staggering left;Staggering right;Scissoring;Narrow base of support Gait velocity: decreased Gait  velocity interpretation: Below normal speed for age/gender General Gait Details: Intermittent min assist for balance correction due to staggering. VC for safety and awareness. Declines to use assistive device today. SpO2 down to 87% on room air, improves with seated rest break. Educated on pursed lip breathing technique.  Stairs            Wheelchair Mobility    Modified Rankin (Stroke Patients Only)       Balance Overall balance assessment: Needs assistance Sitting-balance support: No upper extremity supported;Feet supported Sitting balance-Leahy Scale: Good     Standing balance support: No upper extremity supported Standing balance-Leahy Scale: Fair                               Pertinent Vitals/Pain Pain Assessment: No/denies pain    Home Living Family/patient expects to be discharged to:: Private residence Living Arrangements: Alone Available Help at Discharge: Friend(s);Available 24 hours/day (boyfriend and best friend) Type of Home: Apartment Home Access: Level entry     Home Layout: One level Home Equipment: None      Prior Function Level of Independence: Independent               Hand Dominance   Dominant Hand: Right    Extremity/Trunk Assessment   Upper Extremity Assessment: Defer to OT evaluation           Lower Extremity Assessment: Generalized weakness         Communication   Communication: No difficulties  Cognition Arousal/Alertness: Lethargic Behavior During Therapy: WFL for tasks assessed/performed Overall Cognitive Status: Within Functional Limits for tasks assessed  General Comments General comments (skin integrity, edema, etc.): SpO2 94% on room air at rest, 87% ambulating on room air, and upper 90s on 2L at rest.    Exercises        Assessment/Plan    PT Assessment Patient needs continued PT services  PT Diagnosis Difficulty walking;Abnormality of gait;Generalized  weakness   PT Problem List Decreased strength;Decreased activity tolerance;Decreased balance;Decreased mobility;Decreased coordination;Decreased knowledge of use of DME;Cardiopulmonary status limiting activity  PT Treatment Interventions DME instruction;Gait training;Functional mobility training;Therapeutic activities;Therapeutic exercise;Balance training;Neuromuscular re-education;Patient/family education   PT Goals (Current goals can be found in the Care Plan section) Acute Rehab PT Goals Patient Stated Goal: none stated PT Goal Formulation: With patient Time For Goal Achievement: 04/20/15 Potential to Achieve Goals: Good    Frequency Min 3X/week   Barriers to discharge        Co-evaluation               End of Session Equipment Utilized During Treatment: Gait belt Activity Tolerance: Patient tolerated treatment well Patient left: in chair;with call bell/phone within reach Nurse Communication: Mobility status;Other (comment) (sats)         Time: 9244-6286 PT Time Calculation (min) (ACUTE ONLY): 17 min   Charges:   PT Evaluation $Initial PT Evaluation Tier I: 1 Procedure     PT G CodesEllouise Newer 04/06/2015, 12:58 PM  Camille Bal Dugger, [place]

## 2015-04-06 NOTE — Progress Notes (Signed)
Subjective:  Hospital day 6  Patient says she continues to have cough and generally feels "bad."  She does not have SOB at rest (though has been on 2L O2 via [place] since 11/7), however she did feel "wobbly" and lightheaded when walking to the bathroom yesterday.  Denies LOC or dizziness at rest.  No dizziness with sitting up in bed.  Reports she has been trying to breath deeply using incentive spirometer present on bedside table.  Says she is has a good appetite and denies any N/V/D or abdominal pain.  One bowel movement yesterday which was normal.  Does not make urine at baseline as she is ESRD on dialysis.  Continues to have right-sided chest wall pain that is worse when touched, which she attributes to TEE performed yesterday.  Patient is not aware of any areas of skin breakdown or painful sores.  Denies leg swelling or calf pain.    Of note, patient had some concerns this morning about how she was getting vancomycin given that in the past she had had difficult IV access.  Informed patient that I would check to ensure vancomycin is being administered during the dialysis sessions.  Objective: Vital signs  Filed Vitals:   04/05/15 2119 04/05/15 2216 04/06/15 0520 04/06/15 0729  BP: 85/37 96/42 138/55   Pulse: 82  95   Temp: 98.8 F (37.1 C)  102 F (38.9 C) 101.5 F (38.6 C)  TempSrc: Oral  Oral Oral  Resp: 18  18   Height:      Weight: 66.5 kg (146 lb 9.7 oz)     SpO2: 92%  90%    Weight change: -2 kg (-4 lb 6.6 oz)  Filed Weights   04/05/15 1207 04/05/15 1610 04/05/15 2119  Weight: 69.7 kg (153 lb 10.6 oz) 66.3 kg (146 lb 2.6 oz) 66.5 kg (146 lb 9.7 oz)    Intake/Output Summary (Last 24 hours) at 04/06/15 0731 Last data filed at 04/06/15 0616  Gross per 24 hour  Intake    435 ml  Output   3200 ml  Net  -2765 ml   Physical exam Vitals Blood pressure 138/55, pulse 95, temperature 101.5 F (38.6 C), RR 18, height 5\' 1"  (1.549 m), weight 66.5 kg (146 lb 9.7 oz), SpO2 90 %    General Chronically-ill appearing laying in bed in NAD, initially asleep but roused to voice.  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  Sclera are muddy but anicteric  Neck Supraclavicular nodes still present.  No obvious JVD.  Patient does not have any IJ or obvious other lines. Tender to palpation on right chest wall below the subclavical but there is no obvious erythema, skin breakdown, warmth or raised area to suggest infection or foreign body  Cardiovascular Regular rate and rhythm with audible systolic murmur at RUSB.    Pulmonary Normal work of breathing on 2L [place].  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    Neuro Patient was able to sit up without assistance; question if patient had periodic, brief episodes of loss of upper body tone  Psych Affect is less blunted than 11/8   Lab Results: CMP Latest Ref Rng  04/06/2015 04/05/2015 04/05/2015  Glucose 65 - 99 mg/dL 532(H) 254(H) 238(H)  BUN 6 - 20 mg/dL 23(H) 49(H) 42(H)  Creatinine 0.44 - 1.00 mg/dL 5.97(H) 10.31(H) 9.32(H)  Sodium 135 - 145 mmol/L 129(L) 128(L) 128(L)  Potassium 3.5 - 5.1 mmol/L 4.5 5.1 4.6  Chloride 101 - 111 mmol/L 90(L) 93(L) 92(L)  CO2 22 - 32 mmol/L 25 19(L) 23  Calcium 8.9 - 10.3 mg/dL 8.2(L) 8.2(L) 8.2(L)  Total Protein 6.5 - 8.1 g/dL - - -  Total Bilirubin 0.3 - 1.2 mg/dL - - -  Alkaline Phos 38 - 126 U/L - - -  AST 15 - 41 U/L - - -  ALT 14 - 54 U/L - - -   CBC Latest Ref Rng 04/06/2015 04/05/2015 04/05/2015  WBC 4.0 - 10.5 K/uL 6.8 7.0 6.8  Hemoglobin 12.0 - 15.0 g/dL 8.8(L) 8.8(L) 8.3(L)  Hematocrit 36.0 - 46.0 % 29.5(L) 28.3(L) 27.5(L)  Platelets 150 -  400 K/uL 180 164 165   Micro Results: Recent Results (from the past 240 hour(s))  Culture, blood (routine x 2)     Status: None   Collection Time: 04/01/15  7:50 PM  Result Value Ref Range Status   Specimen Description BLOOD RIGHT ARM  Final   Special Requests IN PEDIATRIC BOTTLE 1CC  Final   Culture  Setup Time   Final    GRAM POSITIVE COCCI IN CLUSTERS AEROBIC BOTTLE ONLY CRITICAL RESULT CALLED TO, READ BACK BY AND VERIFIED WITH: A RIO 04/02/15 @ 38 M VESTAL    Culture STAPHYLOCOCCUS SPECIES (COAGULASE NEGATIVE)  Final   Report Status 04/04/2015 FINAL  Final   Organism ID, Bacteria STAPHYLOCOCCUS SPECIES (COAGULASE NEGATIVE)  Final      Susceptibility   Staphylococcus species (coagulase negative) - MIC*    CIPROFLOXACIN >=8 RESISTANT Resistant     ERYTHROMYCIN >=8 RESISTANT Resistant     GENTAMICIN <=0.5 SENSITIVE Sensitive     OXACILLIN >=4 RESISTANT Resistant     TETRACYCLINE >=16 RESISTANT Resistant     VANCOMYCIN 1 SENSITIVE Sensitive     TRIMETH/SULFA 160 RESISTANT Resistant     CLINDAMYCIN <=0.25 SENSITIVE Sensitive     RIFAMPIN <=0.5 SENSITIVE Sensitive     Inducible Clindamycin NEGATIVE Sensitive     * STAPHYLOCOCCUS SPECIES (COAGULASE NEGATIVE)  Culture, blood (routine x 2)     Status: None   Collection Time: 04/01/15  7:56 PM  Result Value Ref Range Status   Specimen Description BLOOD LEFT HAND  Final   Special Requests IN PEDIATRIC BOTTLE 2CC  Final   Culture  Setup Time   Final    GRAM POSITIVE COCCI IN CLUSTERS AEROBIC BOTTLE ONLY CRITICAL RESULT CALLED TO, READ BACK BY AND VERIFIED WITH: A RIO 04/02/15 @ 1321 M VESTAL    Culture   Final    STAPHYLOCOCCUS SPECIES (COAGULASE NEGATIVE) SUSCEPTIBILITIES PERFORMED ON PREVIOUS CULTURE WITHIN THE LAST 5 DAYS.    Report Status 04/04/2015 FINAL  Final  Culture, blood (routine x 2)     Status: None (Preliminary result)   Collection Time: 04/03/15 11:45 AM  Result Value Ref Range Status   Specimen Description  BLOOD RIGHT HAND  Final   Special Requests IN PEDIATRIC BOTTLE 4CC  Final   Culture  Setup Time   Final    GRAM POSITIVE COCCI IN CLUSTERS CRITICAL RESULT CALLED TO, READ BACK BY AND VERIFIED WITH: Louanne Skye 10:25 04/04/15 ([email or web address])    Culture   Final    STAPHYLOCOCCUS SPECIES (COAGULASE  NEGATIVE) SUSCEPTIBILITIES PERFORMED ON PREVIOUS CULTURE WITHIN THE LAST 5 DAYS.    Report Status PENDING  Incomplete  Culture, blood (routine x 2)     Status: None (Preliminary result)   Collection Time: 04/03/15  7:35 PM  Result Value Ref Range Status   Specimen Description BLOOD RIGHT ANTECUBITAL  Final   Special Requests BOTTLES DRAWN AEROBIC ONLY 8CC  Final   Culture NO GROWTH 2 DAYS  Final   Report Status PENDING  Incomplete  Tissue culture     Status: None (Preliminary result)   Collection Time: 04/04/15  5:15 PM  Result Value Ref Range Status   Specimen Description TISSUE  Final   Special Requests HEAD NECK LYMPHADENOPATHY  Final   Gram Stain   Final    NO WBC SEEN NO ORGANISMS SEEN Performed at [facility]    Culture PENDING  Incomplete   Report Status PENDING  Incomplete  Culture, blood (routine x 2)     Status: None (Preliminary result)   Collection Time: 04/05/15 12:22 AM  Result Value Ref Range Status   Specimen Description BLOOD RIGHT HAND  Final   Special Requests IN PEDIATRIC BOTTLE 3CC  Final   Culture  Setup Time   Final    GRAM POSITIVE COCCI IN CHAINS IN PAIRS AEROBIC BOTTLE ONLY CRITICAL RESULT CALLED TO, READ BACK BY AND VERIFIED WITHDwaine Gale RN [id number] 04/05/15 A BROWNING    Culture PENDING  Incomplete   Report Status PENDING  Incomplete   Studies/Results: Dg Chest 2 View  04/04/2015  CLINICAL DATA:  Cough, weakness, and fever for a few days. EXAM: CHEST  2 VIEW COMPARISON:  04/02/2015 FINDINGS: The cardiac silhouette is mildly enlarged, unchanged. There is persistent mild elevation of the right hemidiaphragm. The patient has taken a greater inspiration  than on the prior study. There is slightly improved aeration of the lung bases with persistent mild basilar opacities and mild interstitial prominence bilaterally. No pneumothorax or pleural effusion is seen. Right upper quadrant abdominal surgical clips are noted. No acute osseous abnormality is identified. IMPRESSION: Greater inspiration with improved aeration of the lung bases. Residual mild bibasilar opacities may reflect atelectasis, mild interstitial edema, or infection. Electronically Signed   By: [name] M.D.   On: 04/04/2015 09:26   US Biopsy  04/05/2015  CLINICAL DATA:  44 year old female with a history of head and neck lymphadenopathy EXAM: ULTRASOUND GUIDED CORE BIOPSY OF HEAD NECK LYMPH NODES MEDICATIONS: 1.0 mg IV Versed; 50 mcg IV Fentanyl Total Moderate Sedation Time: 20 PROCEDURE: The procedure, risks, benefits, and alternatives were explained to the patient. Questions regarding the procedure were encouraged and answered. The patient understands and consents to the procedure. Ultrasound survey of the left neck was performed with images stored and sent to PACs. The left neck was prepped with Betadine in a sterile fashion, and a sterile drape was applied covering the operative field. A sterile gown and sterile gloves were used for the procedure. Local anesthesia was provided with 1% Lidocaine. Once the patient is prepped and draped sterilely, the skin and subcutaneous tissues were generously infiltrated with 1% lidocaine without epinephrine. A small stab incision was made, an using ultrasound guidance, several separate 18 gauge core biopsy were retrieved of enlarged lymph node of the left cervical region. Final images demonstrate no complicating features. Patient tolerated the procedure well and remained hemodynamically stable throughout. No complications encountered and no significant blood loss encountered. COMPLICATIONS: None. FINDINGS: Ultrasound survey demonstrates borderline enlarged  lymph node of the left head and neck, with the usual architecture maintained. Images during the case demonstrate multiple core biopsy with the tip of the needle/biopsy device through the node. IMPRESSION: Status post ultrasound-guided biopsy of left cervical lymph node. Sample was sent for culture, cytology, and pathology. Signed, [name]. [name], DO [facility] Electronically Signed   By: Corrie Mckusick D.O.   On: 04/05/2015 08:09   TEE 11/8: Study Conclusions - Left ventricle: Systolic function was normal. The estimated ejection fraction was in the range of 60% to 65%. Wall motion was normal; there were no regional wall motion abnormalities. - Aortic valve: No evidence of vegetation. There was trivial regurgitation. - Mitral valve: No evidence of vegetation. There was mild regurgitation. - Left atrium: No evidence of thrombus in the atrial cavity or appendage. - Right atrium: No evidence of thrombus in the atrial cavity or appendage. - Atrial septum: No defect or patent foramen ovale was identified. There was an atrial septal aneurysm. - Tricuspid valve: There was moderate regurgitation.  Impressions: - No evidence of thrombus or vegetation.  Medications: I have reviewed the patient's current medications. Scheduled Meds: . calcium acetate  667 mg Oral BID WC  . clonazePAM  1 mg Oral QHS  . [START ON 04/07/2015] darbepoetin (ARANESP) injection - DIALYSIS  200 mcg Intravenous Q Thu-HD  . dextromethorphan-guaiFENesin  1 tablet Oral BID  . dextrose  1 ampule Intravenous Once  . doxercalciferol  1 mcg Intravenous Q T,Th,Sa-HD  . enoxaparin  70 mg Subcutaneous Q24H  . feeding supplement (NEPRO CARB STEADY)  237 mL Oral BID BM  . gabapentin  100 mg Oral Daily  . guaiFENesin  600 mg Oral BID  . insulin aspart  0-9 Units Subcutaneous TID WC  . insulin glargine  4 Units Subcutaneous BID  . lidocaine  1 patch  Transdermal Q24H  . metoCLOPramide  10 mg Oral TID AC & HS  . montelukast  10 mg Oral QHS  . multivitamin  1 tablet Oral Daily  . pantoprazole  40 mg Oral QHS  . simvastatin  20 mg Oral QHS  . vancomycin  750 mg Intravenous Q T,Th,Sa-HD  . 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  Developed cough on Friday and has had persistent fevers despite vancomycin treatment for oxacillin-resistant, coag-neg staph bacteremia without known infectious source. Repeat blood culture from 11/8 showed gram positive cocci in chains and pairs, concerning for strep pneumo bacteremia.  Started on loading dose of levaquin 11/9 in addition to vanc.    Of note, patient is very responsive to insulin and has been both hyperglycemic to >1000 on admission, and hypoglycemic to the  50s when restarted on home regimen and made NPO for procedures.  Watching glucose carefully.  Still awaiting results from biopsy on 11/7.  #Lymphadenopathy suspected 2/2 lymphoma vs mets: bilateral supraclavicular node lymphadenopathy and CT  showing mediastinal lymphadenopathy with spiculated mass in right pectoralis major  -Follow up cytology/pathology results from biopsy performed 11/7 -CT abd/pelvis obtained evening of 11/4 showed stomach wall thickening suggestive of possible diffuse neoplastic process, correlate with pathology as needed.  Consider outpatient EGD.  #Bacteremia: persistent fevers despite vanc treatment for coag-neg, oxacillin resistant staph, now with repeat culture from 11/8 showing GPC in chains and pairs concerning for strep pneumo; Levaquin added 11/9.  Pt has no obvious source of skin breakdown and TEE 11/8 was negative for vegetations.  ID consult has been obtained.   -Continue vanc 750mg  during T/Th/Sat HD -IV levaquin per pharmacy dosing started 11/9 -Consider vascular consult to evaluate for any non-native AV access remnants that may be nidus for infection -Follow up repeat blood culture speciation/sensitivity from 11/8 -Follow up ID consult from Dr. 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 Outpatient EGD given CT findings of possible neoplastic process involving GI tract  #T1DM: very brittle control at baseline, hyperglycemic to >1000 on admission with subsequent hypoglycemic episodes in the 50s-60s; monitor glucose closely and adjust insulin as needed -Lantus increased to 4 Units  BID -Continue with sliding scale insulin aspart TID with meals  -Re-start home pre-meal insulin aspart, 2 units TID with meals in addition to SSI -Continue to monitor fingerstick and CBG  #ESRD on T/Th/Sat HD -calcium acetate 667mg  BID with meals -doxercalciferol 35mcg with HD -vanc with HD as above  #Pain: -Tylenol 650mg  q6 PRN -5mg  oxycodone increased from q6hrs PRN  #Mood: blunted affect observed in context of suspected cancer dx, however pt denies SIGEAPS symptoms outside the hospital -Recommend PCP reassessment of mood at f/u  -Would consider starting 25mg  sertraline x1 week and increasing to 50mg  if tolerated  #Anemia of chronic disease -251mcg Darbopoetin Alfa every Thursday during HD to stimulate bone marrow   #Prior DVT: warfarin was held 11/4 --> 11/8 for biopsy -Continue lovenox 70mg  q24hrs -11/8, Warfarin was restarted per pharmacy dosing   #Sleep: pt on 1mg  clonzaepam at home daily at bedtime -Continue home dose to avoid benzo withdrawal   #HTN -hold amlodipine and metoprolol given low BPs overnight  #HLD -continue simvastatin 20mg   #Dispo: deferred at this time -Will need outpatient follow up with heme/onc and PCP  This is a Careers information officer Note.  The care of the patient was discussed with Dr. Berline Lopes and the assessment and plan formulated with their assistance.  Please see their attached note for official documentation of the daily encounter.   LOS: 6 days   Ladell Pier, Med Student 04/06/2015, 7:31 AM

## 2015-04-06 NOTE — Progress Notes (Signed)
Patient seen and examined. Case d/w residents in detail. I agree with findings and plan as documented in Dr. Elon Jester note.  Patient with persistent fevers and bacteremia. Repeat blood cultures with GPC in chains possibly strep.  Uncrtain etiology of bacteremia. Will c/w vancomycin. Started on levaquin for possible strep.  LN biopsy with normal lymph tissue. May need surgical biopsy for further evaluation.

## 2015-04-07 ENCOUNTER — Inpatient Hospital Stay (HOSPITAL_COMMUNITY): Payer: Medicare Other

## 2015-04-07 ENCOUNTER — Encounter (HOSPITAL_COMMUNITY): Payer: Self-pay | Admitting: General Surgery

## 2015-04-07 DIAGNOSIS — R7881 Bacteremia: Secondary | ICD-10-CM

## 2015-04-07 DIAGNOSIS — R591 Generalized enlarged lymph nodes: Secondary | ICD-10-CM

## 2015-04-07 LAB — RENAL FUNCTION PANEL
ANION GAP: 12 (ref 5–15)
Albumin: 2.3 g/dL — ABNORMAL LOW (ref 3.5–5.0)
BUN: 31 mg/dL — ABNORMAL HIGH (ref 6–20)
CALCIUM: 8.1 mg/dL — AB (ref 8.9–10.3)
CHLORIDE: 89 mmol/L — AB (ref 101–111)
CO2: 25 mmol/L (ref 22–32)
Creatinine, Ser: 7.87 mg/dL — ABNORMAL HIGH (ref 0.44–1.00)
GFR, EST AFRICAN AMERICAN: 7 mL/min — AB (ref >60)
GFR, EST NON AFRICAN AMERICAN: 6 mL/min — AB (ref >60)
Glucose, Bld: 122 mg/dL — ABNORMAL HIGH (ref 65–99)
Phosphorus: 3.3 mg/dL (ref 2.5–4.6)
Potassium: 3.5 mmol/L (ref 3.5–5.1)
SODIUM: 126 mmol/L — AB (ref 135–145)

## 2015-04-07 LAB — CBC
HEMATOCRIT: 29.4 % — AB (ref 36.0–46.0)
Hemoglobin: 9.2 g/dL — ABNORMAL LOW (ref 12.0–15.0)
MCH: 29.7 pg (ref 26.0–34.0)
MCHC: 31.3 g/dL (ref 30.0–36.0)
MCV: 94.8 fL (ref 78.0–100.0)
PLATELETS: 125 10*3/uL — AB (ref 150–400)
RBC: 3.1 MIL/uL — ABNORMAL LOW (ref 3.87–5.11)
RDW: 17.8 % — AB (ref 11.5–15.5)
WBC: 7.5 10*3/uL (ref 4.0–10.5)

## 2015-04-07 LAB — GLUCOSE, CAPILLARY
Glucose-Capillary: 41 mg/dL — CL (ref 65–99)
Glucose-Capillary: 95 mg/dL (ref 65–99)
Glucose-Capillary: 127 mg/dL — ABNORMAL HIGH (ref 65–99)
Glucose-Capillary: 153 mg/dL — ABNORMAL HIGH (ref 65–99)
Glucose-Capillary: 307 mg/dL — ABNORMAL HIGH (ref 65–99)
Glucose-Capillary: 273 mg/dL — ABNORMAL HIGH (ref 65–99)
Glucose-Capillary: 146 mg/dL — ABNORMAL HIGH (ref 65–99)

## 2015-04-07 LAB — PROTIME-INR
INR: 2.25 — ABNORMAL HIGH (ref 0.00–1.49)
Prothrombin Time: 24.7 seconds — ABNORMAL HIGH (ref 11.6–15.2)

## 2015-04-07 LAB — VANCOMYCIN, RANDOM: Vancomycin Rm: 20 ug/mL

## 2015-04-07 IMAGING — CT CT ABD-PELV W/ CM
2 of 5 series · 16 of 46 positions shown, 18 images · IV contrast (omnipaque)
Comparison: 06/10/2012

CLINICAL DATA: Diffuse stabbing abdominal pain with nausea and
vomiting for 2 days, chronic dialysis

EXAM:
CT ABDOMEN AND PELVIS WITH CONTRAST
TECHNIQUE: Multidetector CT imaging of the abdomen and pelvis was performed
using the standard protocol following bolus administration of
intravenous contrast.
CONTRAST:  100mL OMNIPAQUE IOHEXOL 300 MG/ML  SOLN

[Series 2: abd/ pelvis 5.0 i30f 1 · axial · 0.80mm/px · z∈[+1053,+1418]mm · 13 of 83 slices shown, 15 images]
[im 5/83  soft-tissue]
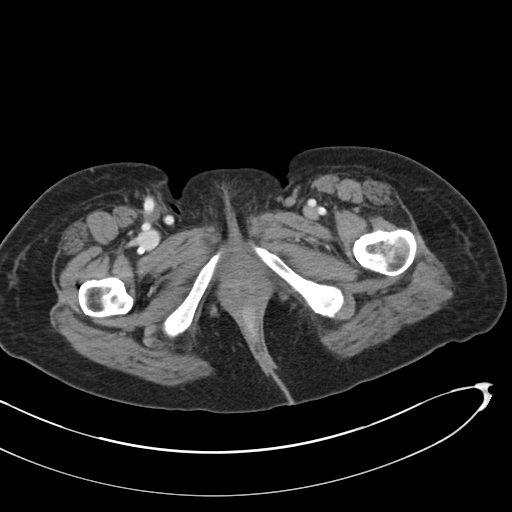
[im 5/83  bone]
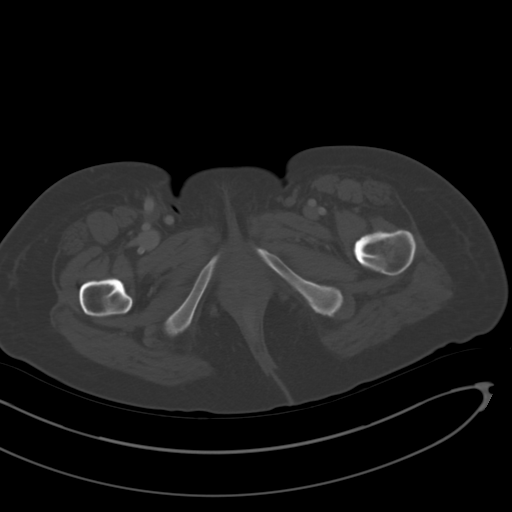
[im 13/83  soft-tissue]
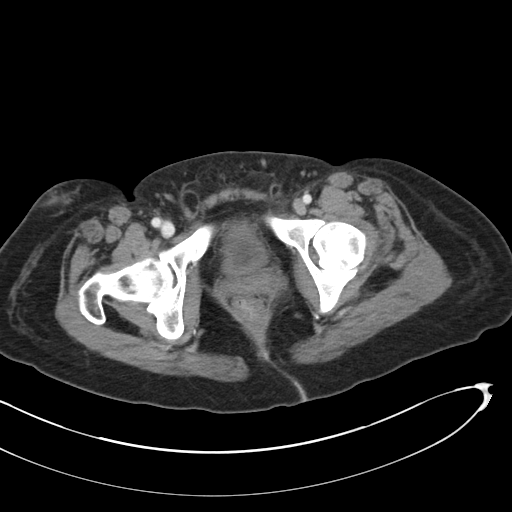
[im 17/83  soft-tissue]
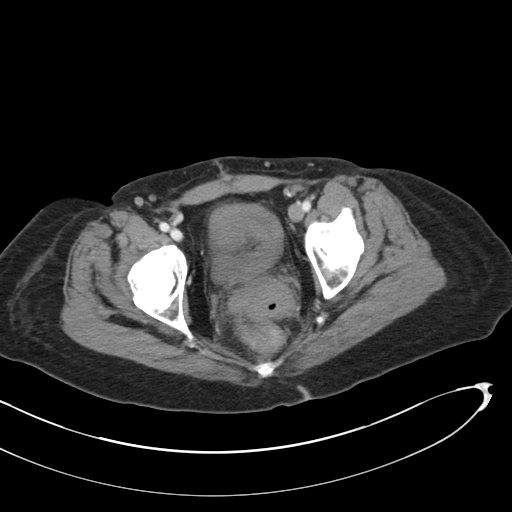
[im 25/83  soft-tissue]
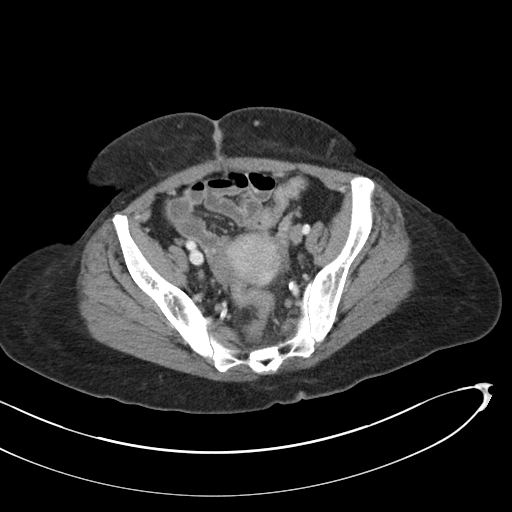
[im 29/83  soft-tissue]
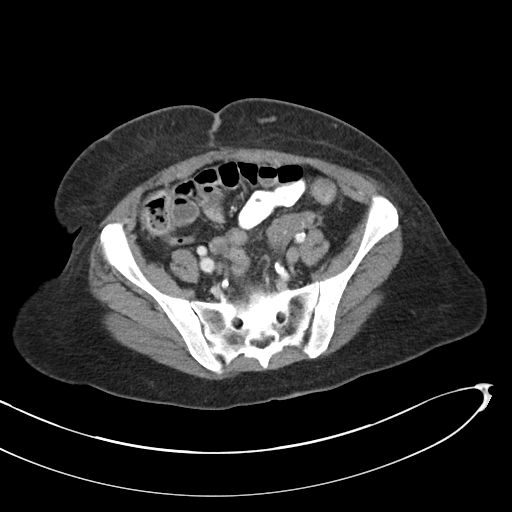
[im 37/83  soft-tissue]
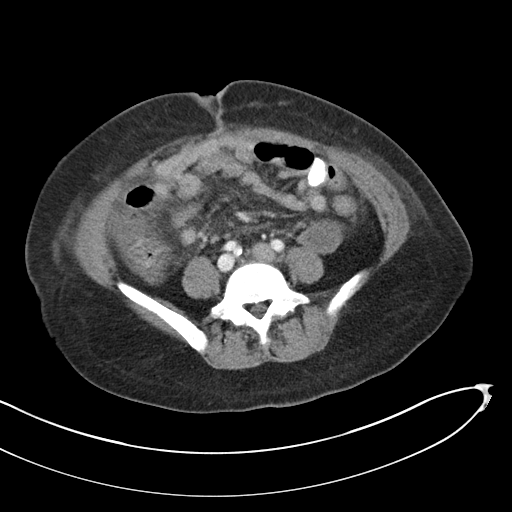
[im 42/83  soft-tissue]
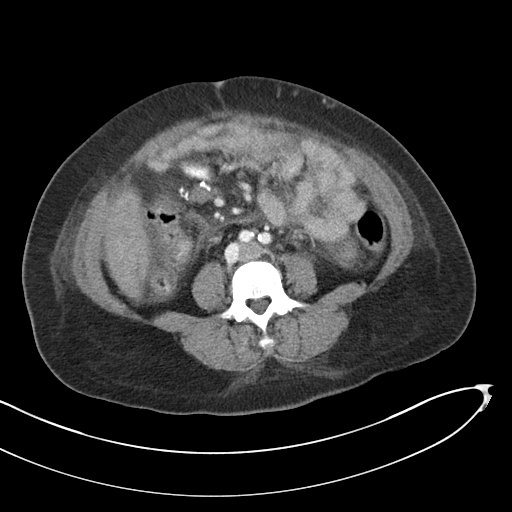
[im 46/83  soft-tissue]
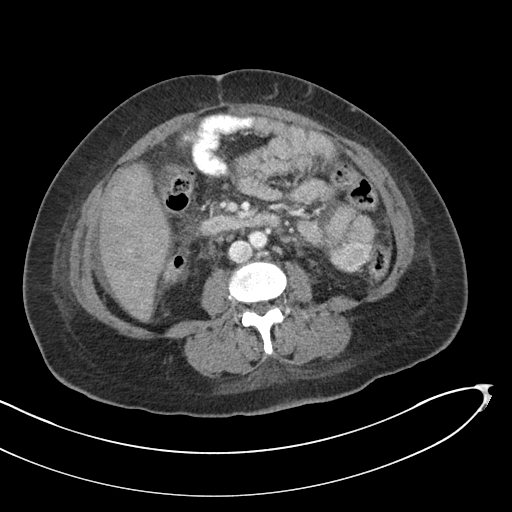
[im 54/83  soft-tissue]
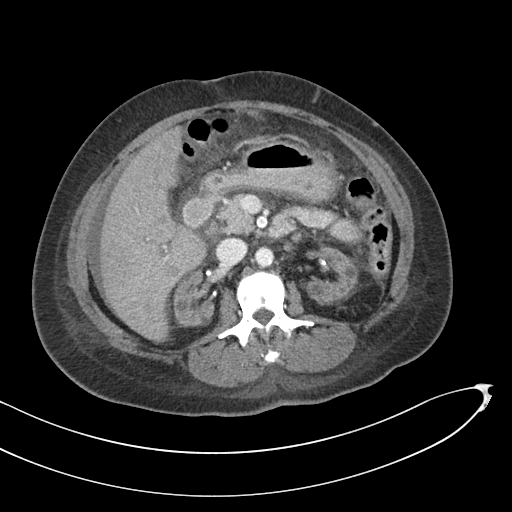
[im 54/83  bone]
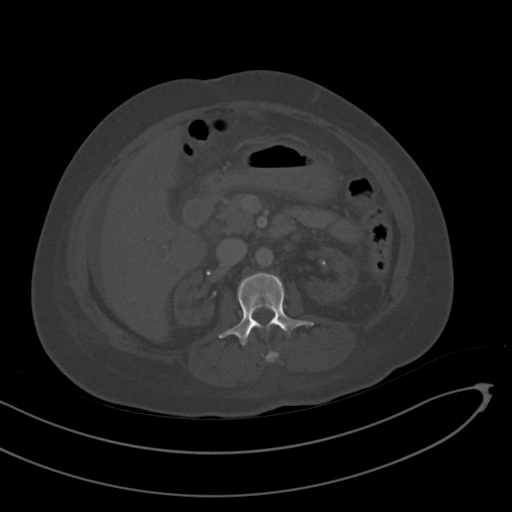
[im 58/83  soft-tissue]
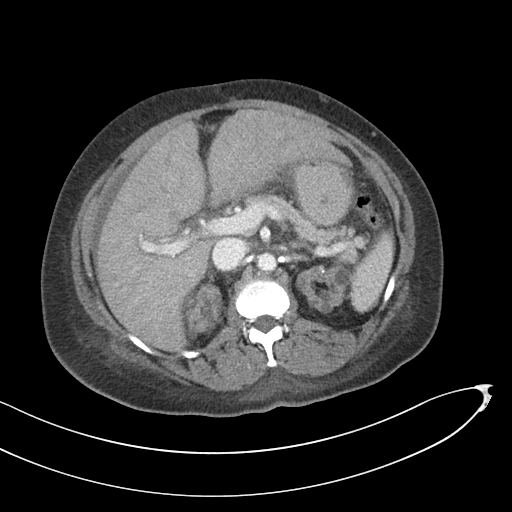
[im 66/83  soft-tissue]
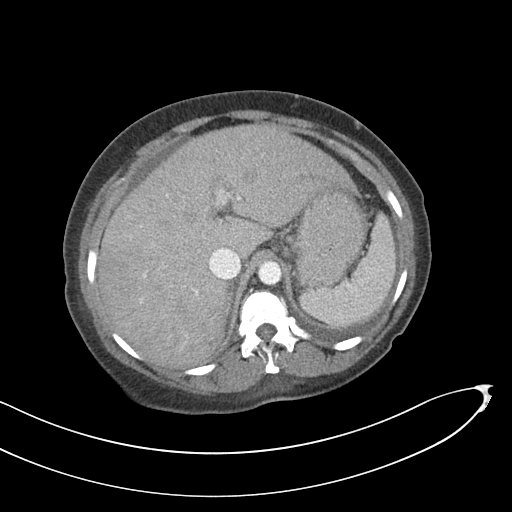
[im 70/83  soft-tissue]
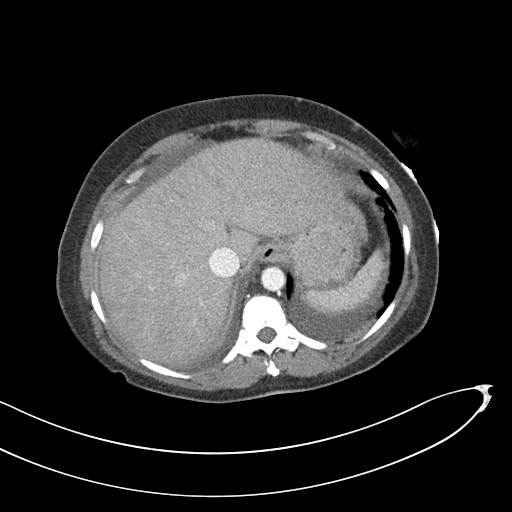
[im 78/83  soft-tissue]
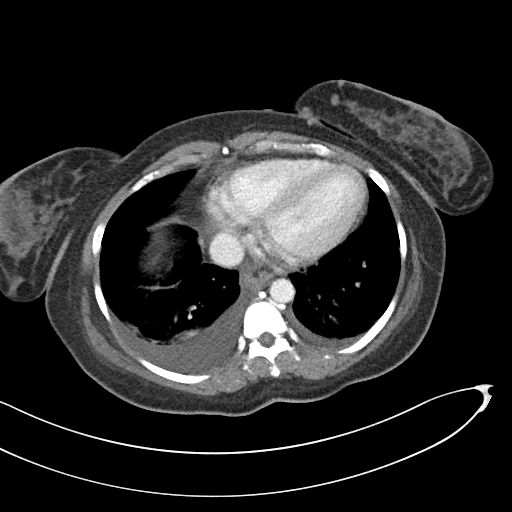

[Series 4: cor · coronal · 0.69mm/px · 3 of 132 slices shown]
[im 44/132  soft-tissue]
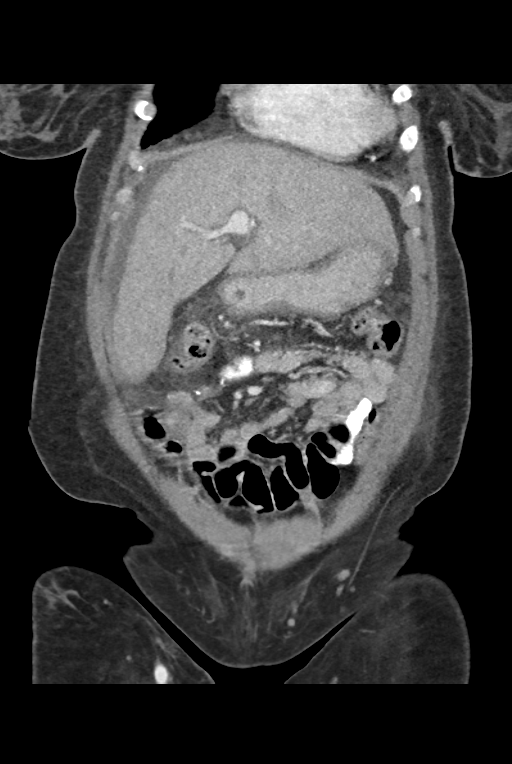
[im 59/132  soft-tissue]
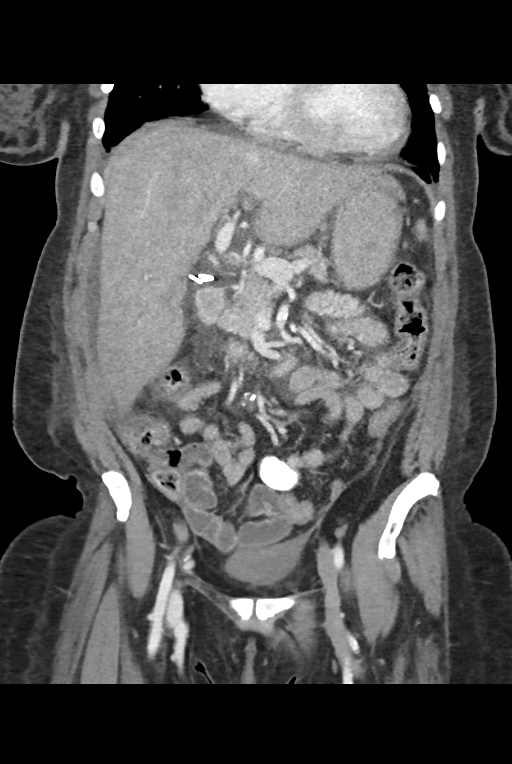
[im 73/132  soft-tissue]
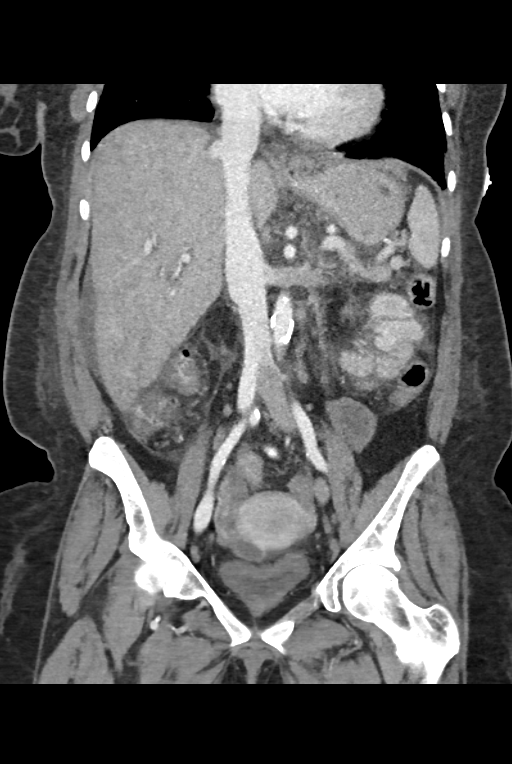

[16 of 46 positions shown; findings below may reference images not displayed]

FINDINGS: There is mild scattered bilateral hazy ground-glass attenuation with
more focal dependent atelectasis bilaterally. There is a tiny left
and small to moderate right pleural effusion.

There are no focal hepatic abnormalities. There is mild periportal
edema. The spleen, pancreas, and adrenal glands are normal. There is
severe bilateral renal atrophy and cystic change. Changes of
pancreatic transplantation identified in the right lower quadrant.
There is a nonobstructive bowel gas pattern with mild diffuse colon
wall thickening. There is also mild infiltration of the omentum and
inflammatory change in the adjacent fat. These findings are similar
to the prior study. The appendix is normal. Reproductive organs and
bladder are normal. There is a small volume of ascites. Small
transplanted kidney left flank unchanged. There are no acute
musculoskeletal findings.
IMPRESSION: Diffuse colon wall thickening with mild infiltration of the omentum.
The findings again suggest a diffuse colitis.

## 2015-04-07 IMAGING — CR DG CHEST 2V
2 series · 2 of 2 positions shown · non-contrast
Comparison: 02/19/2013

CLINICAL DATA: Shortness of breath

EXAM:
CHEST  2 VIEW

[w chest pa]
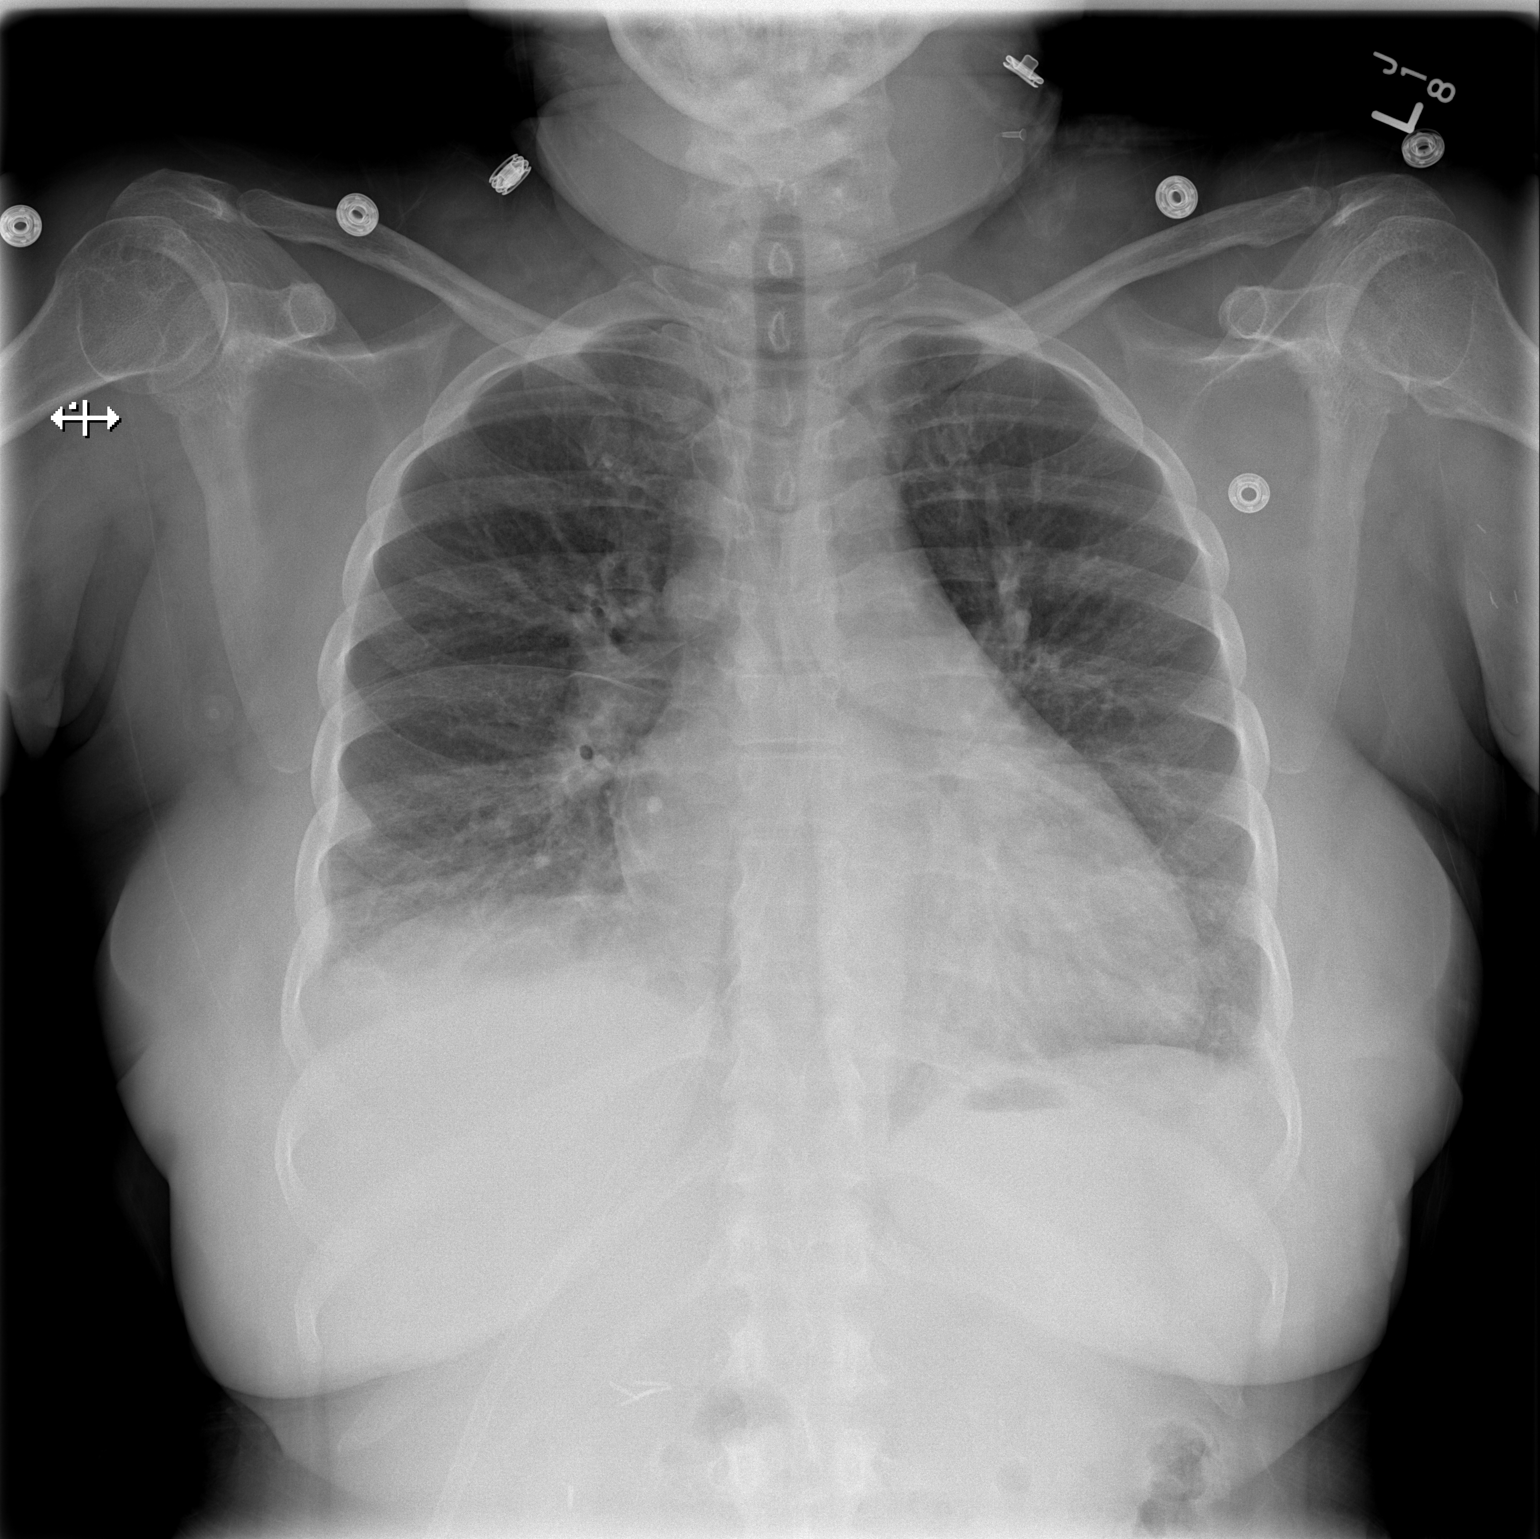

[w chest lat]
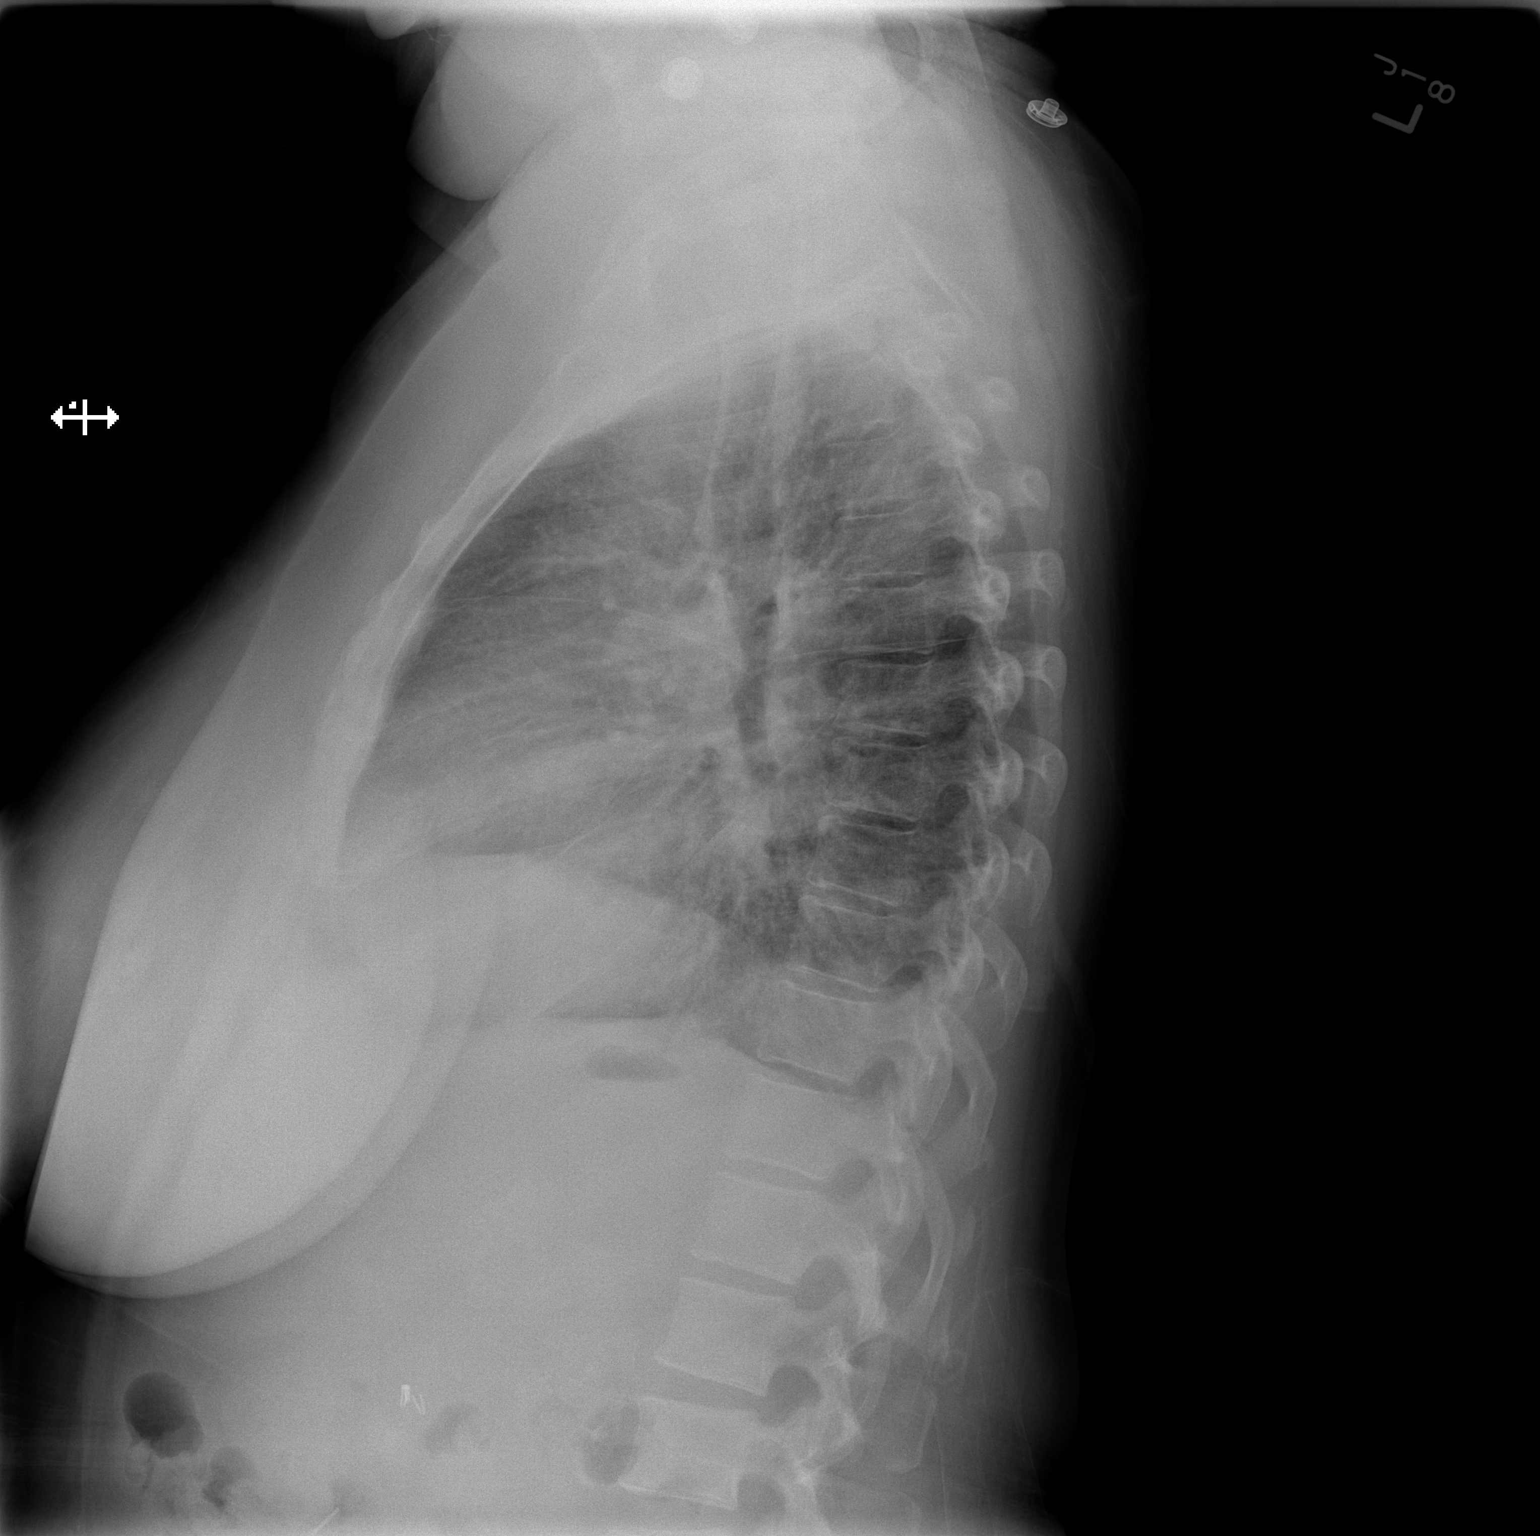

[2 of 2 positions shown; findings below may reference images not displayed]

FINDINGS: The cardio pericardial silhouette is enlarged. There is right base
atelectasis or infiltrate. Small right pleural effusion associated.
Imaged bony structures of the thorax are intact.
IMPRESSION: Right base atelectasis or pneumonia with small right pleural
effusion.

## 2015-04-07 MED ORDER — WARFARIN SODIUM 5 MG PO TABS
5.0000 mg | ORAL_TABLET | Freq: Once | ORAL | Status: AC
  Administered 2015-04-07: 5 mg via ORAL
  Filled 2015-04-07: qty 1

## 2015-04-07 MED ORDER — DARBEPOETIN ALFA 200 MCG/0.4ML IJ SOSY
PREFILLED_SYRINGE | INTRAMUSCULAR | Status: AC
  Filled 2015-04-07: qty 0.4

## 2015-04-07 MED ORDER — INSULIN GLARGINE 100 UNIT/ML ~~LOC~~ SOLN
2.0000 [IU] | Freq: Two times a day (BID) | SUBCUTANEOUS | Status: DC
  Administered 2015-04-07: 2 [IU] via SUBCUTANEOUS
  Filled 2015-04-07 (×3): qty 0.02

## 2015-04-07 MED ORDER — INSULIN GLARGINE 100 UNIT/ML ~~LOC~~ SOLN
2.0000 [IU] | Freq: Every day | SUBCUTANEOUS | Status: DC
  Administered 2015-04-07 – 2015-04-11 (×5): 2 [IU] via SUBCUTANEOUS
  Filled 2015-04-07 (×6): qty 0.02

## 2015-04-07 MED ORDER — INSULIN GLARGINE 100 UNIT/ML ~~LOC~~ SOLN: 4.0000 [IU] | Freq: Every morning | SUBCUTANEOUS | Status: DC

## 2015-04-07 MED ORDER — DOXERCALCIFEROL 4 MCG/2ML IV SOLN
INTRAVENOUS | Status: AC
  Filled 2015-04-07: qty 2

## 2015-04-07 MED ORDER — INSULIN GLARGINE 100 UNIT/ML ~~LOC~~ SOLN
4.0000 [IU] | Freq: Every morning | SUBCUTANEOUS | Status: DC
  Administered 2015-04-08 – 2015-04-12 (×4): 4 [IU] via SUBCUTANEOUS
  Filled 2015-04-07 (×5): qty 0.04

## 2015-04-07 MED ORDER — INSULIN ASPART 100 UNIT/ML ~~LOC~~ SOLN
0.0000 [IU] | Freq: Three times a day (TID) | SUBCUTANEOUS | Status: DC
  Administered 2015-04-07: 3 [IU] via SUBCUTANEOUS
  Administered 2015-04-08: 2 [IU] via SUBCUTANEOUS
  Administered 2015-04-08: 5 [IU] via SUBCUTANEOUS
  Administered 2015-04-08: 1 [IU] via SUBCUTANEOUS
  Administered 2015-04-09: 4 [IU] via SUBCUTANEOUS
  Administered 2015-04-09: 1 [IU] via SUBCUTANEOUS
  Administered 2015-04-10 (×2): 2 [IU] via SUBCUTANEOUS
  Administered 2015-04-11: 3 [IU] via SUBCUTANEOUS

## 2015-04-07 NOTE — Consult Note (Signed)
[facility] [place]  Reason for Consult:  Evaluate dialysis accesses, bacteremia and fevers of unknown etiology.  Referring Provider:  Ladell Pier, Medical student MRN #:  [id number]  History of Present Illness: This is a 44 y.o. female who presents with persistent fevers and bacteremia. Repeat blood cultures revealed gram positive cocci in chains. TEE did not show any vegetations. We have been asked to evaluate her dialysis accesses as potential sources of infection. ID is currently following. She is s/p new left thigh graft on 01/14/15 due to a painful right thigh graft. 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 She is currently on coumadin therapy.   She is still using her right AV thigh graft for dialysis as her left thigh graft is painful. Her right thigh graft pain has improved.   She presented to the [facility] ED on 03/30/15 due to neck swelling that had been present for the previous 2 weeks. CT scan of the neck and chest revealed diffuse lymphadenopathy concerning for lymphoma/metastatic disease. Lymph node biopsy was performed by IR on 04/04/15 and was negative.   She currently complains of chills and nonproductive. She denies any chest discomfort, shortness of breath, nausea, abdominal pain.   Past Medical History  Diagnosis Date  . Hyperlipidemia   . Alopecia   . Obesity   . Iron deficiency   . RLS (restless legs syndrome)   . Injury of conjunctiva and corneal abrasion of right eye without foreign body   . Cellulitis   . Anemia   . Blood transfusion 2004    "when I had my transplant"  . Migraine   . Hyperparathyroidism, secondary ([place])   . Diabetic retinopathy   . Hypertension     takes Metoprolol and Exforge daily  . Depression     takes Klonopin nightly  . Diabetes mellitus type 1 (HCC)     Pt states diagnosed at age 47 with prior episodes of  DKA. S/p pancreatic transplant   . GERD (gastroesophageal reflux disease)     takes Protonix daily  . Bronchitis   . Migraine   . Asthma   . Emphysema of lung ([place])   . Angina     none in past 2 years.  . Stomach pain     Hx: of chronic  . Pneumonia 2012    "double"  . Dialysis patient ([place])     tues,thurs,sat  . 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  Surgery was done at [facility].     . DKA (diabetic ketoacidoses) ([place]) 12/01/2014  . Diastolic CHF, acute on chronic ([place]) 01/02/2015  . Stroke [facility]) 03/2014    ? TIA  . ESRD (end stage renal disease) [facility])     S/p pancreatic and kidney transplant, but back on HD 2008, dialysis at [place], Sat   Past Surgical History  Procedure Laterality Date  . 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 Thyroglobulin      x 7  . Av fistula placement      right upper arm  . Cholecystectomy  1995  .  hd graft placement/removal      "had 2  in my left upper arm"  . Eye surgery    . Retinopathy surgery    . Tooth extraction  10/10/11    X's two  . Insertion of dialysis catheter  12/08/2011    Procedure: INSERTION OF DIALYSIS CATHETER;  Surgeon: Angelia Mould, MD;  Location: [place];  Service: Vascular;  Laterality: Left;  . Av fistula placement  01/22/2012    Procedure: INSERTION OF ARTERIOVENOUS (AV) GORE-TEX GRAFT ARM;  Surgeon: Angelia Mould, MD;  Location: [place];  Service: Vascular;  Laterality: Left;  Ultrasound guided.  . Av fistula placement  03/14/2012    Procedure: INSERTION OF ARTERIOVENOUS (AV) GORE-TEX GRAFT THIGH;  Surgeon: Angelia Mould, MD;  Location: [place];  Service: Vascular;  Laterality: Right;  . False aneurysm repair Right 01/02/2013    Procedure:  Excision of lymphocele in right thigh.;  Surgeon: Angelia Mould, MD;  Location: [place];  Service: Vascular;  Laterality: Right;  . Carpal tunnel release Right 03/02/2013    Procedure: CARPAL TUNNEL RELEASE;  Surgeon: Tennis Must, MD;  Location: [place];  Service: Orthopedics;  Laterality: Right;  . Flexible sigmoidoscopy N/A 03/24/2013    Procedure: FLEXIBLE SIGMOIDOSCOPY;  Surgeon: Cleotis Nipper, MD;  Location[facility] ENDOSCOPY;  Service: Endoscopy;  Laterality: N/A;  . Revision of arteriovenous goretex graft Right 04/02/2013    Procedure: REPAIR OF PSEUDOANEURYSM OF ARTERIOVENOUS GORETEX GRAFT;  Surgeon: Angelia Mould, MD;  Location: [place];  Service: Vascular;  Laterality: Right;  . Carpal tunnel release Left 10/16/2013    Procedure: LEFT CARPAL TUNNEL RELEASE;  Surgeon: Tennis Must, MD;  Location: [place];  Service: Orthopedics;  Laterality: Left;  . Unilateral upper extremeity angiogram N/A 11/12/2011    Procedure: UNILATERAL UPPER Anselmo Rod;  Surgeon: Angelia Mould, MD;  Location: [facility] CATH LAB;  Service: Cardiovascular;  Laterality: N/A;  . Fistulogram Left 03/03/2012    Procedure: FISTULOGRAM;  Surgeon: Angelia Mould, MD;  Location: [facility] CATH LAB;  Service: Cardiovascular;  Laterality: Left;  . Av fistula placement Left 01/14/2015    Procedure: INSERTION OF LEFT ARTERIOVENOUS (AV) GORE-TEX GRAFT THIGH;  Surgeon: Angelia Mould, MD;  Location: [place];  Service: Vascular;  Laterality: Left;  . Tee without cardioversion N/A 04/05/2015    Procedure: TRANSESOPHAGEAL ECHOCARDIOGRAM (TEE);  Surgeon: Skeet Latch, MD;  Location: [facility] ENDOSCOPY;  Service: Cardiovascular;  Laterality: N/A;    Allergies  Allergen Reactions  . Depakote [Divalproex Sodium] Other (See Comments)    Pt gets "delirious"  . Morphine And Related Nausea And Vomiting and Other (See Comments)    "makes me delirious"  . Penicillins Anaphylaxis     Tolerated Zosyn Dec 2014  . Tramadol Nausea And Vomiting  . Imitrex [Sumatriptan] Other (See Comments)    seizures  . Vicodin [Hydrocodone-Acetaminophen] Itching and Rash    Prior to Admission medications   Medication Sig Start Date End Date Taking? Authorizing Provider  albuterol (PROVENTIL HFA;VENTOLIN HFA) 108 (90 BASE) MCG/ACT inhaler Inhale 2 puffs into the lungs every 4 (four) hours as needed for wheezing or shortness of breath (as per home regimen). 02/20/13  Yes Kinnie Feil, MD  amLODipine (NORVASC) 10 MG tablet Take 10 mg by mouth at bedtime.  11/17/14  Yes Historical Provider, MD  aspirin 325 MG tablet Take 1 tablet (325 mg total) by mouth daily. 05/20/14  Yes Velvet Bathe, MD  calcium acetate (PHOSLO) 667 MG capsule Take 1 capsule (  667 mg total) by mouth 2 (two) times daily. Patient taking differently: Take 2,001 mg by mouth 3 (three) times daily with meals.  11/16/14  Yes Theodis Blaze, MD  clonazePAM (KLONOPIN) 0.5 MG tablet Take 1 mg by mouth at bedtime.    Yes Historical Provider, MD  Darbepoetin Alfa (ARANESP) 40 MCG/0.4ML SOSY injection Inject 0.4 mLs (40 mcg total) into the vein every Thursday with hemodialysis. 08/05/14  Yes Geradine Girt, DO  doxercalciferol (HECTOROL) 4 MCG/2ML injection Inject 1 mL (2 mcg total) into the vein Every Tuesday,Thursday,and Saturday with dialysis. 08/05/14  Yes Geradine Girt, DO  gabapentin (NEURONTIN) 100 MG capsule Take 100 mg by mouth daily.    Yes Historical Provider, MD  guaiFENesin (MUCINEX) 600 MG 12 hr tablet Take 1 tablet (600 mg total) by mouth 2 (two) times daily. 01/07/15  Yes Barton Dubois, MD  insulin glargine (LANTUS) 100 UNIT/ML injection Inject 0.07 mLs (7 Units total) into the skin 2 (two) times daily. 08/05/14  Yes Geradine Girt, DO  insulin lispro (HUMALOG KWIKPEN) 100 UNIT/ML KiwkPen Inject 6 Units into the skin 2 (two) times daily.    Yes Historical Provider, MD  lidocaine (LIDODERM) 5 % Place 1 patch onto the skin daily.  Remove & Discard patch within 12 hours or as directed by MD 12/06/14  Yes Kelvin Cellar, MD  metoCLOPramide (REGLAN) 10 MG tablet Take 1 tablet (10 mg total) by mouth 4 (four) times daily -  before meals and at bedtime. 12/06/14  Yes Kelvin Cellar, MD  metoprolol succinate (TOPROL-XL) 50 MG 24 hr tablet Take 1 tablet (50 mg total) by mouth daily. Take with or immediately following a meal. 01/07/15  Yes Barton Dubois, MD  montelukast (SINGULAIR) 10 MG tablet Take 10 mg by mouth at bedtime.   Yes Historical Provider, MD  multivitamin (RENA-VIT) TABS tablet Take 1 tablet by mouth daily. 08/05/14  Yes Geradine Girt, DO  Nutritional Supplements (FEEDING SUPPLEMENT, NEPRO CARB STEADY,) LIQD Take 237 mLs by mouth 2 (two) times daily between meals. 04/23/14  Yes Orson Eva, MD  Oxycodone HCl 10 MG TABS Take 10 mg by mouth 3 (three) times daily as needed (for pain).  03/23/15  Yes Historical Provider, MD  oxyCODONE-acetaminophen (PERCOCET/ROXICET) 5-325 MG tablet Take 1-2 tablets by mouth every 6 (six) hours as needed for severe pain. 03/02/15  Yes Angelia Mould, MD  pantoprazole (PROTONIX) 40 MG tablet Take 1 tablet (40 mg total) by mouth at bedtime. Patient taking differently: Take 40 mg by mouth daily. Takes daily in the afternoon 08/05/14  Yes Geradine Girt, DO  promethazine (PHENERGAN) 25 MG tablet Take 1 tablet (25 mg total) by mouth every 6 (six) hours as needed for nausea or vomiting. 12/06/14  Yes Kelvin Cellar, MD  simvastatin (ZOCOR) 20 MG tablet Take 1 tablet (20 mg total) by mouth at bedtime. 05/20/14  Yes Velvet Bathe, MD  warfarin (COUMADIN) 5 MG tablet Take 0.5 tablets (2.5 mg total) by mouth daily at 6 PM. Patient taking differently: Take 5-7.5 mg by mouth See admin instructions. Take 1 tablet on Tuesday, Thursday and Saturday then take 1 and 1/2 tablets on Monday, Wednesday, Friday and Sunday 01/23/15  Yes Ulyses Amor, PA-C  zolpidem (AMBIEN) 5 MG tablet Take 5 mg by mouth at bedtime  as needed. sleep 02/09/14  Yes Historical Provider, MD  enoxaparin (LOVENOX) 60 MG/0.6ML injection Inject 0.7 mLs (70 mg total) into the skin daily. 04/02/15 04/05/15  Jacquelyn  Ferd Glassing, MD    Social History   Social History  . Marital Status: Single    Spouse Name: N/A  . Number of Children: N/A  . Years of Education: N/A   Occupational History  . disabled    Social History Main Topics  . Smoking status: Never Smoker   . Smokeless tobacco: Never Used  . Alcohol Use: No  . Drug Use: No  . Sexual Activity: Yes    Birth Control/ Protection: None   Other Topics Concern  . Not on file   Social History Narrative   Lives at home with fiance, has a 61yo daughter. Ambulatory without cane or walker. Never smoker, no drugs or alcohol. Lost mom last year, has no brothers or sisters.     Family History  Problem Relation Age of Onset  . Hypertension Mother   . Kidney disease Mother   . Diabetes Mother   . Hypertension Father   . Kidney disease Father   . Diabetes Father     ROS: [x]  Positive   [ ]  Negative   [ ]  All sytems reviewed and are negative  Cardiovascular: []  chest pain/pressure []  palpitations []  SOB lying flat []  DOE []  pain in legs while walking []  pain in legs at rest []  pain in legs at night []  non-healing ulcers []  hx of DVT []  swelling in legs  Pulmonary: []  productive cough []  asthma/wheezing []  home O2  Neurologic: []  weakness in []  arms []  legs []  numbness in []  arms []  legs []  hx of CVA []  mini stroke [] difficulty speaking or slurred speech []  temporary loss of vision in one eye []  dizziness  Hematologic: []  hx of cancer []  bleeding problems []  problems with blood clotting easily  Endocrine:   []  diabetes []  thyroid disease  GI []  vomiting blood []  blood in stool  GU: [x]  CKD/renal failure []  HD--[]  M/W/F or [x]  T/T/S []  burning with urination []  blood in urine  Psychiatric: []  anxiety []  depression  Musculoskeletal: []   arthritis []  joint pain  Integumentary: []  rashes []  ulcers  Constitutional: [x]  fever [x]  chills   Physical Examination  Filed Vitals:   04/07/15 1114  BP: 111/53  Pulse: 92  Temp: 97.9 F (36.6 C)  Resp: 16   Body mass index is 27.47 kg/(m^2).  General:  WDWN in NAD Gait: Not observed HENT: WNL, normocephalic Pulmonary: normal non-labored breathing, without Rales, rhonchi,  wheezing Cardiac: regular, without  Murmurs, rubs or gallops;  Skin: without rashes, without ulcers  Extremities: left thigh graft with palpable thrill, no erythema, fluctuance or evidence of infection, all incisions well healed. Right thigh graft with pressure dressings. Palpable thrill. Did not remove as patient had just finished HD. No evidence of infection. Palpable pedal pulses bilaterally. Previous non-functioning fistulas in bilateral arms without evidence of infection.  Musculoskeletal: no muscle wasting or atrophy  Neurologic: A&O X 3; Appropriate Affect ; SENSATION: normal; MOTOR FUNCTION:  moving all extremities equally. Speech is fluent/normal  CBC    Component Value Date/Time   WBC 7.5 04/07/2015 0558   RBC 3.10* 04/07/2015 0558   RBC 3.71* 01/21/2014 2031   HGB 9.2* 04/07/2015 0558   HCT 29.4* 04/07/2015 0558   PLT 125* 04/07/2015 0558   MCV 94.8 04/07/2015 0558   MCH 29.7 04/07/2015 0558   MCHC 31.3 04/07/2015 0558   RDW 17.8* 04/07/2015 0558   LYMPHSABS 2.1 04/03/2015 0221   MONOABS 0.9 04/03/2015 0221   EOSABS 0.1 04/03/2015 0221  BASOSABS 0.0 04/03/2015 0221    BMET    Component Value Date/Time   NA 126* 04/07/2015 0743   K 3.5 04/07/2015 0743   CL 89* 04/07/2015 0743   CO2 25 04/07/2015 0743   GLUCOSE 122* 04/07/2015 0743   BUN 31* 04/07/2015 0743   CREATININE 7.87* 04/07/2015 0743   CALCIUM 8.1* 04/07/2015 0743   CALCIUM 8.4 03/06/2011 0751   GFRNONAA 6* 04/07/2015 0743   GFRAA 7* 04/07/2015 0743    COAGS: Lab Results  Component Value Date   INR 2.25*  04/07/2015   INR 2.11* 04/06/2015   INR 1.51* 04/05/2015    ASSESSMENT: This is a 44 y.o. female with bacteremia and fevers, diffuse lymphadenopathy.   PLAN: Patient with history of multiple accesses and fever of unknown etiology. Currently with two functioning thigh grafts. Her right and left thigh grafts were evaluated and did not appear to be infected. There was no fluctuance, erythema or drainage. They both appear well incorporated. Her old nonfunctioning arm accesses were evaluated as well and showed no evidence of infection. Vascular surgery will sign off. Please call if we can be of further assistance.   Virgina Jock, PA-C [facility] Office: [phone] Pager: [phone]  Agree with above. Neither thigh graft looks infected. UE's look fine too. Will order Duplex of grafts to look for any fluid around the graft.  Deitra Mayo, MD, [place] [phone] Office: [phone]

## 2015-04-07 NOTE — Progress Notes (Signed)
[facility] Progress Note  Assessment/Plan: 1. R chest and neck pain - soft tissue mass R chest and lymphadenopathy by CT. s/p LN biopsy by IR showed normal lymph tissue; likely needs excisional bx; tissue culture no growth so far 2. MRSE bacteremia with persistent fevers 11/4 2/2 + on Vanc - repeat cultures 11/6 1/2 coag neg staph-   BC-redrawn 11/8 1/2 + gram + cocci. Continues on Vanc with therapeutic levels Tmax 102.9. TEE negative for vegetations of thrombus. 3. ESRD TTS - on HD - using right thigh graft for now 4. BP/volume- off BP meds; UF 2.5 on Saturday- with post weight 67.2 Net UF Tuesday 3.2 with post weight 0000000; keep systolic BP [id number] 5. Anemia max ESA Hgb 9.2 - stable overall 6. MBD cont hect, binders - last P 4.8 - 7. 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 DM per primary - BS labile - diet is very high in simple carbs- needs complex carbs and protein 9. Nutrition alb 2.5- renal carb mod/renavit- intake variable - nutrition consult  [name], PA-C [place] [phone] 04/07/2015,7:53 AM  LOS: 7 days   Subjective:   Neck still sore.Fevers last night. Breathing ok. No N or V  Objective Filed Vitals:   04/07/15 0419 04/07/15 0715 04/07/15 0718 04/07/15 0730  BP: 102/51 81/36 97/47  102/52  Pulse: 73 74 74 71  Temp: 97.4 F (36.3 C) 98.2 F (36.8 C)    TempSrc: Oral Oral    Resp: 17 16 15 16   Height:      Weight:  68.4 kg (150 lb 12.7 oz)    SpO2: 100%      Physical Exam goal 3 L BP 80 - 90s  General: NAD supine on HD Heart: RRR Lungs: coarse BS with occ wheeze Abdomen: soft NT + BS Extremities: no LE edema Dialysis Access: bilateral thigh grafts patent  Dialysis Orders: Center: [facility] 3.75 hr 400/600 2 K 2.5 Ca right thigh AVGG left thigh AVGG Hectorol 1 venofer 100 x 2 then 50 per week, Mircera 225 - started 11/3 - last got 200 10/6  Additional Objective Labs: Lab  Results  Component Value Date   INR 2.25* 04/07/2015   INR 2.11* 04/06/2015   INR 1.51* 0000000    Basic Metabolic Panel:  Recent Labs Lab 04/03/15 0221 04/05/15 0022 04/05/15 1112 04/06/15 0430  NA 132* 128* 128* 129*  K 4.9 4.6 5.1 4.5  CL 94* 92* 93* 90*  CO2 24 23 19* 25  GLUCOSE 50* 238* 254* 532*  BUN 22* 42* 49* 23*  CREATININE 4.94* 9.32* 10.31* 5.97*  CALCIUM 8.8* 8.2* 8.2* 8.2*  PHOS 2.5  --  4.8*  --    Liver Function Tests:  Recent Labs Lab 03/31/15 1555 04/03/15 0221 04/05/15 1112  AST 32  --   --   ALT 15  --   --   ALKPHOS 174*  --   --   BILITOT 0.8  --   --   PROT 7.7  --   --   ALBUMIN 2.8* 2.8* 2.5*   CBC:  Recent Labs Lab 04/02/15 0839 04/03/15 0221 04/05/15 0022 04/05/15 1112 04/06/15 0430 04/07/15 0558  WBC  --  9.5 6.8 7.0 6.8 7.5  NEUTROABS 5.8 6.4  --   --   --   --   HGB  --  10.3* 8.3* 8.8* 8.8* 9.2*  HCT  --  32.8* 27.5* 28.3* 29.5* 29.4*  MCV  --  94.3 93.9 95.3 94.9 94.8  PLT  --  203 165 164 180 125*   Blood Culture    Component Value Date/Time   SDES BLOOD RIGHT HAND 04/05/2015 0037   SPECREQUEST BOTTLES DRAWN AEROBIC AND ANAEROBIC 4CC 04/05/2015 0037   CULT NO GROWTH 1 DAY 04/05/2015 0037   REPTSTATUS PENDING 04/05/2015 0037   CBG:  Recent Labs Lab 04/06/15 1625 04/06/15 2047 04/07/15 0417 04/07/15 0505 04/07/15 0639  GLUCAP 70 123* 41* 95 127*   Medications:   . calcium acetate  667 mg Oral BID WC  . clonazePAM  1 mg Oral QHS  . darbepoetin (ARANESP) injection - DIALYSIS  200 mcg Intravenous Q Thu-HD  . dextromethorphan-guaiFENesin  1 tablet Oral BID  . dextrose  1 ampule Intravenous Once  . doxercalciferol  1 mcg Intravenous Q T,Th,Sa-HD  . enoxaparin  70 mg Subcutaneous Q24H  . feeding supplement (NEPRO CARB STEADY)  237 mL Oral BID BM  . gabapentin  100 mg Oral Daily  . guaiFENesin  600 mg Oral BID  . insulin aspart  0-9 Units Subcutaneous TID WC  . insulin aspart  2 Units Subcutaneous TID  WC  . insulin glargine  2 Units Subcutaneous BID  . [START ON 04/08/2015] levofloxacin (LEVAQUIN) IV  500 mg Intravenous Q48H  . lidocaine  1 patch Transdermal Q24H  . metoCLOPramide  10 mg Oral TID AC & HS  . montelukast  10 mg Oral QHS  . multivitamin  1 tablet Oral Daily  . pantoprazole  40 mg Oral QHS  . simvastatin  20 mg Oral QHS  . vancomycin  750 mg Intravenous Q T,Th,Sa-HD  . Warfarin - Pharmacist Dosing Inpatient   Does not apply [phone]

## 2015-04-07 NOTE — Progress Notes (Signed)
Subjective: Patient was seen and examined at bedside this morning. Tmax 102.9 in the past 24 hrs. Patient states her cough and chest congestion are better. Denies any chest pain/sob.n/v. No other complaints.   Objective: Vital signs in last 24 hours: Filed Vitals:   04/07/15 0900 04/07/15 0930 04/07/15 1000 04/07/15 1114  BP: 118/59 133/63 127/58 111/53  Pulse: 79 90 90 92  Temp:    97.9 F (36.6 C)  TempSrc:    Oral  Resp: 15 16 15 16   Height:      Weight:    145 lb 4.5 oz (65.9 kg)  SpO2:    98%   Weight change:   Intake/Output Summary (Last 24 hours) at 04/07/15 1328 Last data filed at 04/07/15 1114  Gross per 24 hour  Intake    600 ml  Output   2428 ml  Net  -1828 ml   Physical Exam: Gen: AAOx3, NAD HEENT: bilateral enlarged supraclavicular LN, tender to palpation on right side CVS: RRR, S1 and S2 appreciated, systolic murmur   Lungs: mild diffuse rhonchi (improved since yesterday) Abd: soft, NT, ND. +BS.  Ext: no edema Skin: warm and dry. No rashes noted.  Lab Results: Basic Metabolic Panel:  Recent Labs Lab 04/05/15 1112 04/06/15 0430 04/07/15 0743  NA 128* 129* 126*  K 5.1 4.5 3.5  CL 93* 90* 89*  CO2 19* 25 25  GLUCOSE 254* 532* 122*  BUN 49* 23* 31*  CREATININE 10.31* 5.97* 7.87*  CALCIUM 8.2* 8.2* 8.1*  PHOS 4.8*  --  3.3   Liver Function Tests:  Recent Labs Lab 03/31/15 1555  04/05/15 1112 04/07/15 0743  AST 32  --   --   --   ALT 15  --   --   --   ALKPHOS 174*  --   --   --   BILITOT 0.8  --   --   --   PROT 7.7  --   --   --   ALBUMIN 2.8*  < > 2.5* 2.3*  < > = values in this interval not displayed. CBC:  Recent Labs Lab 04/02/15 0839 04/03/15 0221  04/06/15 0430 04/07/15 0558  WBC  --  9.5  < > 6.8 7.5  NEUTROABS 5.8 6.4  --   --   --   HGB  --  10.3*  < > 8.8* 9.2*  HCT  --  32.8*  < > 29.5* 29.4*  MCV  --  94.3  < > 94.9 94.8  PLT  --  203  < > 180 125*  < > = values in this interval not displayed. CBG:  Recent  Labs Lab 04/06/15 1625 04/06/15 2047 04/07/15 0417 04/07/15 0505 04/07/15 0639 04/07/15 1113  GLUCAP 70 123* 41* 95 127* 153*   Hemoglobin A1C:  Recent Labs Lab 03/31/15 1912  HGBA1C 10.0*   Coagulation:  Recent Labs Lab 04/04/15 0355 04/05/15 0022 04/06/15 0430 04/07/15 0558  LABPROT 19.1* 18.3* 23.5* 24.7*  INR 1.61* 1.51* 2.11* 2.25*    Micro Results: Recent Results (from the past 240 hour(s))  Culture, blood (routine x 2)     Status: None   Collection Time: 04/01/15  7:50 PM  Result Value Ref Range Status   Specimen Description BLOOD RIGHT ARM  Final   Special Requests IN PEDIATRIC BOTTLE 1CC  Final   Culture  Setup Time   Final    GRAM POSITIVE COCCI IN CLUSTERS AEROBIC BOTTLE ONLY CRITICAL RESULT CALLED  TO, READ BACK BY AND VERIFIED WITH: A RIO 04/02/15 @ 35 M VESTAL    Culture STAPHYLOCOCCUS SPECIES (COAGULASE NEGATIVE)  Final   Report Status 04/04/2015 FINAL  Final   Organism ID, Bacteria STAPHYLOCOCCUS SPECIES (COAGULASE NEGATIVE)  Final      Susceptibility   Staphylococcus species (coagulase negative) - MIC*    CIPROFLOXACIN >=8 RESISTANT Resistant     ERYTHROMYCIN >=8 RESISTANT Resistant     GENTAMICIN <=0.5 SENSITIVE Sensitive     OXACILLIN >=4 RESISTANT Resistant     TETRACYCLINE >=16 RESISTANT Resistant     VANCOMYCIN 1 SENSITIVE Sensitive     TRIMETH/SULFA 160 RESISTANT Resistant     CLINDAMYCIN <=0.25 SENSITIVE Sensitive     RIFAMPIN <=0.5 SENSITIVE Sensitive     Inducible Clindamycin NEGATIVE Sensitive     * STAPHYLOCOCCUS SPECIES (COAGULASE NEGATIVE)  Culture, blood (routine x 2)     Status: None   Collection Time: 04/01/15  7:56 PM  Result Value Ref Range Status   Specimen Description BLOOD LEFT HAND  Final   Special Requests IN PEDIATRIC BOTTLE 2CC  Final   Culture  Setup Time   Final    GRAM POSITIVE COCCI IN CLUSTERS AEROBIC BOTTLE ONLY CRITICAL RESULT CALLED TO, READ BACK BY AND VERIFIED WITH: A RIO 04/02/15 @ 1321 M VESTAL     Culture   Final    STAPHYLOCOCCUS SPECIES (COAGULASE NEGATIVE) SUSCEPTIBILITIES PERFORMED ON PREVIOUS CULTURE WITHIN THE LAST 5 DAYS.    Report Status 04/04/2015 FINAL  Final  Culture, blood (routine x 2)     Status: None (Preliminary result)   Collection Time: 04/03/15 11:45 AM  Result Value Ref Range Status   Specimen Description BLOOD RIGHT HAND  Final   Special Requests IN PEDIATRIC BOTTLE 4CC  Final   Culture  Setup Time   Final    GRAM POSITIVE COCCI IN CLUSTERS CRITICAL RESULT CALLED TO, READ BACK BY AND VERIFIED WITH: Louanne Skye 10:25 04/04/15 ([email or web address])    Culture   Final    STAPHYLOCOCCUS SPECIES (COAGULASE NEGATIVE) SUSCEPTIBILITIES PERFORMED ON PREVIOUS CULTURE WITHIN THE LAST 5 DAYS. GRAM NEGATIVE RODS CRITICAL RESULT CALLED TO, READ BACK BY AND VERIFIED WITH: DR Dareen Piano  04/07/15 @ 89 M VESTAL    Report Status PENDING  Incomplete  Culture, blood (routine x 2)     Status: None (Preliminary result)   Collection Time: 04/03/15  7:35 PM  Result Value Ref Range Status   Specimen Description BLOOD RIGHT ANTECUBITAL  Final   Special Requests BOTTLES DRAWN AEROBIC ONLY 8CC  Final   Culture NO GROWTH 3 DAYS  Final   Report Status PENDING  Incomplete  Tissue culture     Status: None (Preliminary result)   Collection Time: 04/04/15  5:15 PM  Result Value Ref Range Status   Specimen Description TISSUE  Final   Special Requests HEAD NECK LYMPHADENOPATHY  Final   Gram Stain   Final    NO WBC SEEN NO ORGANISMS SEEN Performed at [facility]    Culture   Final    NO GROWTH 2 DAYS Performed at [facility]    Report Status PENDING  Incomplete  Culture, blood (routine x 2)     Status: None (Preliminary result)   Collection Time: 04/05/15 12:22 AM  Result Value Ref Range Status   Specimen Description BLOOD RIGHT HAND  Final   Special Requests IN PEDIATRIC BOTTLE 3CC  Final   Culture  Setup Time   Final  GRAM POSITIVE COCCI IN CHAINS IN PAIRS AEROBIC  BOTTLE ONLY CRITICAL RESULT CALLED TO, READ BACK BY AND VERIFIED WITHDwaine Gale RN [id number] 04/05/15 A BROWNING    Culture   Final    VIRIDANS STREPTOCOCCUS SUSCEPTIBILITIES TO FOLLOW STAPHYLOCOCCUS SPECIES (COAGULASE NEGATIVE) SUSCEPTIBILITIES PERFORMED ON PREVIOUS CULTURE WITHIN THE LAST 5 DAYS.    Report Status PENDING  Incomplete  Culture, blood (routine x 2)     Status: None (Preliminary result)   Collection Time: 04/05/15 12:37 AM  Result Value Ref Range Status   Specimen Description BLOOD RIGHT HAND  Final   Special Requests BOTTLES DRAWN AEROBIC AND ANAEROBIC 4CC  Final   Culture NO GROWTH 1 DAY  Final   Report Status PENDING  Incomplete   Studies/Results: No results found. Medications: I have reviewed the patient's current medications. Scheduled Meds: . calcium acetate  667 mg Oral BID WC  . clonazePAM  1 mg Oral QHS  . darbepoetin (ARANESP) injection - DIALYSIS  200 mcg Intravenous Q Thu-HD  . dextromethorphan-guaiFENesin  1 tablet Oral BID  . dextrose  1 ampule Intravenous Once  . doxercalciferol  1 mcg Intravenous Q T,Th,Sa-HD  . enoxaparin  70 mg Subcutaneous Q24H  . feeding supplement (NEPRO CARB STEADY)  237 mL Oral BID BM  . gabapentin  100 mg Oral Daily  . guaiFENesin  600 mg Oral BID  . insulin aspart  0-9 Units Subcutaneous TID WC  . insulin aspart  2 Units Subcutaneous TID WC  . insulin glargine  2 Units Subcutaneous BID  . [START ON 04/08/2015] levofloxacin (LEVAQUIN) IV  500 mg Intravenous Q48H  . lidocaine  1 patch Transdermal Q24H  . metoCLOPramide  10 mg Oral TID AC & HS  . montelukast  10 mg Oral QHS  . multivitamin  1 tablet Oral Daily  . pantoprazole  40 mg Oral QHS  . simvastatin  20 mg Oral QHS  . vancomycin  750 mg Intravenous Q T,Th,Sa-HD  . 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 Also got CT abdomen which shows small ascites, some cirrhotic changes of liver, diffuse stomach wall thickening concerning for gastritis vs neoplastic involvement (gastric carcinoma vs lymphoma?). HIV and LDH negative. Needle LN biopsy was done by IR showing benign lymph node tissue. However, insufficient cells present for flow cytometry analysis. Surgery was consulted but informed me the neck LNs are not big enough in size for an excisional biopsy. Since the CT also showed diffuse stomach wall thickening. I spoke to Dr. Paulita Fujita [facility]) and he believes the patient is not currently stable to undergo tissue biopsy via EGD due to her fevers, high blood glucose values, and therapeutic INR.  -Oxycodone 5 mg q6 prn pain    -Lidoderm patch   Fever - Tmax 102.9 in the past 24 hrs. BCX (1/2 bottles) growing Strep viridans, coagulase negative staph, and gram negative rods. Vancomycin started on 11/5. TEE did not show any vegetations  making endocarditis less likely. ID has been consulted and is on board. They believe differential for fever includes infection vs tumor fever if patient does indeed have lymphoma/ mets. Skin was examined and no rashes noted. Vascular surgery was consulted and they do not think her dialysis access sites appear infected.  -cont vancomycin with HD -cont IV Levaquin -Tylenol prn  - f/up repeat BCX - susceptibilities pending   Cough: Patient was also coughing and lung exam today remarkable for  mild diffuse rhonchi (improved since yesterday). Currently not complaining of any SOB. Satting 92-100% on 2L via [place]. CXR on 11/7 consistent with atelectasis/ mild interstitial edema/ infection. BCX (1/2 bottles) growing Strep viridans, coagulase negative staph, and gram negative rods.  -Antibiotics as above  -continue Mucinex-DM for congestion  -Albuterol nebulizer prn -Singulair 10 mg daily -continue Incentive spirometer -encourage ambulation (PT on board)  Severe hyperglycemia with DM Type I - Patient's blood glucose was low (41) early this am and 307 now. She was on Lantus 4u BID, Novolog 2u TID, and SSI-S since yesterday.  -Lantus changed to 4 u in then morning and 2u in the evening -continue Novolog 2 u TID -Modified sensitive-SSI  Pseudohyponatremia - monitor with insulin regimen.  H/o LLE DVT (01/19/2015): INR therapeutic (2.25).  -Lovenox discontinued  -continue Coumadin  HFpEF: Stable. TEE showing LVEF >55%, mild MR, moderate TR, and trivial AR. No PR.  -Monitor   ESRD on HD (TTS): New left AVG in 12/2014. Went for HD today.  - appreciate nephro recs.   HTN: BP stable. Continue holding home Amlodipine 10 mg qd, Metoprolol 50 mg qd for now.   HLD: continue Zocor 20 mg qd  GERD: Protonix 40 mg qd   Insomnia: continue Ambien qhs    Code: Full  Dispo: Disposition is deferred at this time, awaiting improvement of current medical problems.  Anticipated discharge in approximately 0 day(s).   The patient does have a current PCP [name], MD) and does need an [facility] hospital follow-up appointment after discharge.  The patient does not have transportation limitations that hinder transportation to clinic appointments.  .Services Needed at time of discharge: Y = Yes, Blank = No PT:   OT:   RN:   Equipment:   Other:     LOS: 7 days   Shela Leff, MD 04/07/2015, 1:28 PM

## 2015-04-07 NOTE — Progress Notes (Addendum)
Temperature is 102.9 orally with BP 88/45.  Asymptomatic.  Notified MD on-call.  No new orders.  Will continue to monitor.

## 2015-04-07 NOTE — Progress Notes (Signed)
ANTICOAGULATION CONSULT NOTE - Follow Up Consult  Pharmacy Consult for Coumadin Indication: hx LLE  DVT (12/2014)   Allergies  Allergen Reactions  . Depakote [Divalproex Sodium] Other (See Comments)    Pt gets "delirious"  . Morphine And Related Nausea And Vomiting and Other (See Comments)    "makes me delirious"  . Penicillins Anaphylaxis    Tolerated Zosyn Dec 2014  . Tramadol Nausea And Vomiting  . Imitrex [Sumatriptan] Other (See Comments)    seizures  . Vicodin [Hydrocodone-Acetaminophen] Itching and Rash    Patient Measurements: Height: 5\' 1"  (154.9 cm) Weight: 145 lb 4.5 oz (65.9 kg) IBW/kg (Calculated) : 47.8  Vital Signs: Temp: 97.9 F (36.6 C) (11/10 1114) Temp Source: Oral (11/10 1114) BP: 111/53 mmHg (11/10 1114) Pulse Rate: 92 (11/10 1114)  Labs:  Recent Labs  04/05/15 0022 04/05/15 1112 04/06/15 0430 04/07/15 0558 04/07/15 0743  HGB 8.3* 8.8* 8.8* 9.2*  --   HCT 27.5* 28.3* 29.5* 29.4*  --   PLT 165 164 180 125*  --   LABPROT 18.3*  --  23.5* 24.7*  --   INR 1.51*  --  2.11* 2.25*  --   CREATININE 9.32* 10.31* 5.97*  --  7.87*    Estimated Creatinine Clearance: 8.1 mL/min (by C-G formula based on Cr of 7.87).  Assessment: 44 yo female with  ESRD on HD TTS on coumadin pta for hx LLE DVT (12/2014). S/P Lymph node biopsy 11/7 by IR for Lymphadenopathy of head and neck .  Coumadin resumed 11/7 with restart of Lovenox bridge.  INR = 2.25,  h/o LLE DVT (12/2014), anemia of CKD -Hgb stable,  PLTC dropped 180>125K. No bleeding noted. Levaquin started 11/9>> may increase coumadin effect,  INR trend 1.51>2.11>2.25 over last 3 days.  Meals eaten : 0-0-100%-0-75% --no meals recorded since lunch 11/9. I spoke to Dr. Marlowe Sax 11/10, she discussed lovenox with attending MD -who said okay to DC the lovenox as long as INR is therapeutic. Patient refused lovenox today.    PTA Coumadin dose: 7.5mg  MWFSu and 5mg  TTSat (taken on 11/2 PTA)  TEE 11/8:  No LA or LAA thrombus  or mass.  No vegetation or mass  Goal of Therapy:  INR 2-3 Monitor platelets by anticoagulation protocol: Yes   Plan:  Lovenox dc'd today per MD Coumadin 5mg  today x1  Daily INR. F/u CBC  If INR falls <2.0 consider restarting Lovenox  Nicole Cella, RPh Clinical Pharmacist Pager: [phone] 04/07/2015 3:22 PM

## 2015-04-07 NOTE — Consult Note (Signed)
[name] [birth year]  [id number].   Requesting MD: Dr. 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  She does require intermittent home O2 therapy.  She presented to [facility] last week after a 2 day history of swelling and tenderness of the right side of her neck.  She was admitted for further work up and found to have bacteremia, fevers, and lymphadenopathy of her cervical chain and some nodes noted in the supraclavicular and axillary region by CT scan.  These were felt to be malignant such as lymphoma or metastatic disease.  There is also a  mass type lesion noted in the medial aspect of the right pectoralis muscle.  She also had a CT of her abd/pel that revealed a thickened stomach along with early changes of cirrhosis with small ascites.  She is on coumadin for a h/o DVT (12/2014) of her LLE with an INR of 2.25.  She had a FNA done by IR of a cervical node which revealed normal tissue.  We have been asked to see for LN excisional biopsy.  ROS : Please see HPI, otherwise negative except for a cough that she states she got while hospitalized from being exposed to all these "sick" people.  Family History  Problem Relation Age of Onset  . Hypertension Mother   . Kidney disease Mother   . Diabetes Mother   . Hypertension Father   . Kidney disease Father   . Diabetes Father   grandmother had cancer but she doesn't know what kind  Past Medical History  Diagnosis Date  . Hyperlipidemia   . Alopecia   . Obesity   . Iron deficiency   . RLS (restless legs syndrome)   . Injury of conjunctiva and corneal abrasion of right eye without foreign body   . Cellulitis   . Anemia   . Blood transfusion 2004    "when I had my transplant"  . Migraine   . Hyperparathyroidism, secondary ([place])   . Diabetic  retinopathy   . Hypertension     takes Metoprolol and Exforge daily  . Depression     takes Klonopin nightly  . Diabetes mellitus type 1 (HCC)     Pt states diagnosed at age 25 with prior episodes of DKA. S/p pancreatic transplant   . GERD (gastroesophageal reflux disease)     takes Protonix daily  . Bronchitis   . Migraine   . Asthma   . Emphysema of lung ([place])   . Angina     none in past 2 years.  . Stomach pain     Hx: of chronic  . Pneumonia 2012    "double"  . Dialysis patient ([facility] [place])     tues,thurs,sat  . History of simultaneous kidney and pancreas transplant ([facility]) 08/01/2011    Transplant kidney failed and went back on HD in 2008, pancreas then failed in 2014 and she has been transitioned off of her immunosuppression medications in late 2014 and early 2015.  Surgery was done at [facility]     . DKA (diabetic ketoacidoses) ([place]) 12/01/2014  . Diastolic CHF, acute on chronic ([place]) 01/02/2015  . Stroke [facility]) 03/2014    ? TIA  . ESRD (end stage renal disease) ([place])     S/p pancreatic and kidney transplant, but back on HD 2008, dialysis at [facility]  [place], Sat    Past Surgical History  Procedure Laterality Date  . 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 Thyroglobulin      x 7  . Av fistula placement      right upper arm  . Cholecystectomy  1995  .  hd graft placement/removal      "had 2 in my left upper arm"  . Eye surgery    . Retinopathy surgery    . Tooth extraction  10/10/11    X's two  . Insertion of dialysis catheter  12/08/2011    Procedure: INSERTION OF DIALYSIS CATHETER;  Surgeon: Angelia Mould, MD;  Location: [place];  Service: Vascular;  Laterality: Left;  . Av fistula placement  01/22/2012    Procedure: INSERTION OF ARTERIOVENOUS (AV) GORE-TEX GRAFT ARM;  Surgeon: Angelia Mould, MD;  Location: [place];  Service: Vascular;  Laterality: Left;  Ultrasound guided.  . Av fistula placement  03/14/2012     Procedure: INSERTION OF ARTERIOVENOUS (AV) GORE-TEX GRAFT THIGH;  Surgeon: Angelia Mould, MD;  Location: [place];  Service: Vascular;  Laterality: Right;  . False aneurysm repair Right 01/02/2013    Procedure: Excision of lymphocele in right thigh.;  Surgeon: Angelia Mould, MD;  Location: [place];  Service: Vascular;  Laterality: Right;  . Carpal tunnel release Right 03/02/2013    Procedure: CARPAL TUNNEL RELEASE;  Surgeon: Tennis Must, MD;  Location: [place];  Service: Orthopedics;  Laterality: Right;  . Flexible sigmoidoscopy N/A 03/24/2013    Procedure: FLEXIBLE SIGMOIDOSCOPY;  Surgeon: Cleotis Nipper, MD;  Location: [facility] ENDOSCOPY;  Service: Endoscopy;  Laterality: N/A;  . Revision of arteriovenous goretex graft Right 04/02/2013    Procedure: REPAIR OF PSEUDOANEURYSM OF ARTERIOVENOUS GORETEX GRAFT;  Surgeon: Angelia Mould, MD;  Location: [place];  Service: Vascular;  Laterality: Right;  . Carpal tunnel release Left 10/16/2013    Procedure: LEFT CARPAL TUNNEL RELEASE;  Surgeon: Tennis Must, MD;  Location: [place];  Service: Orthopedics;  Laterality: Left;  . Unilateral upper extremeity angiogram N/A 11/12/2011    Procedure: UNILATERAL UPPER Anselmo Rod;  Surgeon: Angelia Mould, MD;  Location: [facility] CATH LAB;  Service: Cardiovascular;  Laterality: N/A;  . Fistulogram Left 03/03/2012    Procedure: FISTULOGRAM;  Surgeon: Angelia Mould, MD;  Location: [facility] CATH LAB;  Service: Cardiovascular;  Laterality: Left;  . Av fistula placement Left 01/14/2015    Procedure: INSERTION OF LEFT ARTERIOVENOUS (AV) GORE-TEX GRAFT THIGH;  Surgeon: Angelia Mould, MD;  Location: [place];  Service: Vascular;  Laterality: Left;  . Tee without cardioversion N/A 04/05/2015    Procedure: TRANSESOPHAGEAL ECHOCARDIOGRAM (TEE);  Surgeon: Skeet Latch, MD;  Location: [facility] ENDOSCOPY;  Service: Cardiovascular;  Laterality: N/A;    Social History:   reports that she has never smoked. She has never used smokeless tobacco. She reports that she does not drink alcohol or use illicit drugs.  Allergies:  Allergies  Allergen Reactions  . Depakote [Divalproex Sodium] Other (See Comments)    Pt gets "delirious"  . Morphine And Related Nausea And Vomiting and Other (See Comments)    "makes me delirious"  . Penicillins Anaphylaxis    Tolerated Zosyn Dec 2014  . Tramadol Nausea And Vomiting  . Imitrex [Sumatriptan] Other (See Comments)    seizures  . Vicodin [Hydrocodone-Acetaminophen] Itching and Rash    Medications Prior to Admission  Medication  Sig Dispense Refill  . albuterol (PROVENTIL HFA;VENTOLIN HFA) 108 (90 BASE) MCG/ACT inhaler Inhale 2 puffs into the lungs every 4 (four) hours as needed for wheezing or shortness of breath (as per home regimen). 1 Inhaler 0  . amLODipine (NORVASC) 10 MG tablet Take 10 mg by mouth at bedtime.   11  . aspirin 325 MG tablet Take 1 tablet (325 mg total) by mouth daily. 30 tablet 0  . calcium acetate (PHOSLO) 667 MG capsule Take 1 capsule (667 mg total) by mouth 2 (two) times daily. (Patient taking differently: Take 2,001 mg by mouth 3 (three) times daily with meals. ) 30 capsule 0  . clonazePAM (KLONOPIN) 0.5 MG tablet Take 1 mg by mouth at bedtime.     . Darbepoetin Alfa (ARANESP) 40 MCG/0.4ML SOSY injection Inject 0.4 mLs (40 mcg total) into the vein every Thursday with hemodialysis. 8.4 mL   . doxercalciferol (HECTOROL) 4 MCG/2ML injection Inject 1 mL (2 mcg total) into the vein Every Tuesday,Thursday,and Saturday with dialysis. 2 mL   . gabapentin (NEURONTIN) 100 MG capsule Take 100 mg by mouth daily.     Marland Kitchen guaiFENesin (MUCINEX) 600 MG 12 hr tablet Take 1 tablet (600 mg total) by mouth 2 (two) times daily. 30 tablet 0  . insulin glargine (LANTUS) 100 UNIT/ML injection Inject 0.07 mLs (7 Units total) into the skin 2 (two) times daily. 10 mL 11  . insulin lispro (HUMALOG KWIKPEN) 100 UNIT/ML KiwkPen  Inject 6 Units into the skin 2 (two) times daily.     Marland Kitchen lidocaine (LIDODERM) 5 % Place 1 patch onto the skin daily. Remove & Discard patch within 12 hours or as directed by MD 10 patch 0  . metoCLOPramide (REGLAN) 10 MG tablet Take 1 tablet (10 mg total) by mouth 4 (four) times daily -  before meals and at bedtime. 60 tablet 0  . metoprolol succinate (TOPROL-XL) 50 MG 24 hr tablet Take 1 tablet (50 mg total) by mouth daily. Take with or immediately following a meal. 30 tablet 1  . montelukast (SINGULAIR) 10 MG tablet Take 10 mg by mouth at bedtime.    . multivitamin (RENA-VIT) TABS tablet Take 1 tablet by mouth daily. 30 tablet 0  . Nutritional Supplements (FEEDING SUPPLEMENT, NEPRO CARB STEADY,) LIQD Take 237 mLs by mouth 2 (two) times daily between meals. 60 Can 0  . Oxycodone HCl 10 MG TABS Take 10 mg by mouth 3 (three) times daily as needed (for pain).   0  . oxyCODONE-acetaminophen (PERCOCET/ROXICET) 5-325 MG tablet Take 1-2 tablets by mouth every 6 (six) hours as needed for severe pain. 30 tablet 0  . pantoprazole (PROTONIX) 40 MG tablet Take 1 tablet (40 mg total) by mouth at bedtime. (Patient taking differently: Take 40 mg by mouth daily. Takes daily in the afternoon) 30 tablet 0  . promethazine (PHENERGAN) 25 MG tablet Take 1 tablet (25 mg total) by mouth every 6 (six) hours as needed for nausea or vomiting. 10 tablet 0  . simvastatin (ZOCOR) 20 MG tablet Take 1 tablet (20 mg total) by mouth at bedtime. 30 tablet 0  . warfarin (COUMADIN) 5 MG tablet Take 0.5 tablets (2.5 mg total) by mouth daily at 6 PM. (Patient taking differently: Take 5-7.5 mg by mouth See admin instructions. Take 1 tablet on Tuesday, Thursday and Saturday then take 1 and 1/2 tablets on Monday, Wednesday, Friday and Sunday) 60 tablet 0  . zolpidem (AMBIEN) 5 MG tablet Take 5 mg by mouth  at bedtime as needed. sleep      Blood pressure 111/53, pulse 92, temperature 97.9 F (36.6 C), temperature source Oral, resp. rate 16,  height '5\' 1"'  (1.549 m), weight 65.9 kg (145 lb 4.5 oz), last menstrual period 03/02/2015, SpO2 98 %, unknown if currently breastfeeding. Physical Exam: General: pleasant, mildly obese black female who is laying in bed in NAD HEENT: head is normocephalic, atraumatic.  Sclera are noninjected.  PERRL.  Ears and nose without any masses or lesions.  Mouth is pink. Lymph: axillary nodes are not palpable.  She does have fullness noted in the supraclavicular area, but she is so tender she will not let me palpate hard enough to determine if I can actually feel these LNs.  Cervical chain adenopathy is not impressive either. Heart: regular, rate, and rhythm.  Normal s1,s2. No obvious gallops, or rubs noted. + murmur noted  Palpable radial and pedal pulses bilaterally Lungs: CTAB, no wheezes, rhonchi, or rales noted.  Respiratory effort nonlabored Abd: soft, NT, ND, +BS, no masses, hernias, or organomegaly, midline scar noted from prior laparotomy MS: all 4 extremities are symmetrical with no cyanosis, clubbing, or edema. HD graft in left groin Skin: warm and dry with no masses, lesions, or rashes Psych: A&Ox3 with an appropriate affect.   Results for orders placed or performed during the hospital encounter of 03/30/15 (from the past 48 hour(s))  Glucose, capillary     Status: Abnormal   Collection Time: 04/05/15  5:37 PM  Result Value Ref Range   Glucose-Capillary 184 (H) 65 - 99 mg/dL  Glucose, capillary     Status: Abnormal   Collection Time: 04/05/15  9:16 PM  Result Value Ref Range   Glucose-Capillary 313 (H) 65 - 99 mg/dL  Protime-INR     Status: Abnormal   Collection Time: 04/06/15  4:30 AM  Result Value Ref Range   Prothrombin Time 23.5 (H) 11.6 - 15.2 seconds   INR 2.11 (H) 0.00 - 1.49  CBC     Status: Abnormal   Collection Time: 04/06/15  4:30 AM  Result Value Ref Range   WBC 6.8 4.0 - 10.5 K/uL   RBC 3.11 (L) 3.87 - 5.11 MIL/uL   Hemoglobin 8.8 (L) 12.0 - 15.0 g/dL   HCT 29.5 (L) 36.0  - 46.0 %   MCV 94.9 78.0 - 100.0 fL   MCH 28.3 26.0 - 34.0 pg   MCHC 29.8 (L) 30.0 - 36.0 g/dL   RDW 17.6 (H) 11.5 - 15.5 %   Platelets 180 150 - 400 K/uL  Basic metabolic panel     Status: Abnormal   Collection Time: 04/06/15  4:30 AM  Result Value Ref Range   Sodium 129 (L) 135 - 145 mmol/L   Potassium 4.5 3.5 - 5.1 mmol/L   Chloride 90 (L) 101 - 111 mmol/L   CO2 25 22 - 32 mmol/L   Glucose, Bld 532 (H) 65 - 99 mg/dL   BUN 23 (H) 6 - 20 mg/dL   Creatinine, Ser 5.97 (H) 0.44 - 1.00 mg/dL    Comment: DELTA CHECK NOTED   Calcium 8.2 (L) 8.9 - 10.3 mg/dL   GFR calc non Af Amer 8 (L) >60 mL/min   GFR calc Af Amer 9 (L) >60 mL/min    Comment: (NOTE) The eGFR has been calculated using the CKD EPI equation. This calculation has not been validated in all clinical situations. eGFR's persistently <60 mL/min signify possible Chronic Kidney Disease.    Anion  gap 14 5 - 15  Glucose, capillary     Status: Abnormal   Collection Time: 04/06/15  7:28 AM  Result Value Ref Range   Glucose-Capillary 539 (H) 65 - 99 mg/dL  Glucose, capillary     Status: Abnormal   Collection Time: 04/06/15  9:47 AM  Result Value Ref Range   Glucose-Capillary 518 (H) 65 - 99 mg/dL  Glucose, capillary     Status: Abnormal   Collection Time: 04/06/15 11:55 AM  Result Value Ref Range   Glucose-Capillary 403 (H) 65 - 99 mg/dL  Glucose, capillary     Status: Abnormal   Collection Time: 04/06/15  2:37 PM  Result Value Ref Range   Glucose-Capillary 194 (H) 65 - 99 mg/dL  Glucose, capillary     Status: None   Collection Time: 04/06/15  4:25 PM  Result Value Ref Range   Glucose-Capillary 70 65 - 99 mg/dL  Glucose, capillary     Status: Abnormal   Collection Time: 04/06/15  8:47 PM  Result Value Ref Range   Glucose-Capillary 123 (H) 65 - 99 mg/dL  Glucose, capillary     Status: Abnormal   Collection Time: 04/07/15  4:17 AM  Result Value Ref Range   Glucose-Capillary 41 (LL) 65 - 99 mg/dL  Glucose, capillary      Status: None   Collection Time: 04/07/15  5:05 AM  Result Value Ref Range   Glucose-Capillary 95 65 - 99 mg/dL  Protime-INR     Status: Abnormal   Collection Time: 04/07/15  5:58 AM  Result Value Ref Range   Prothrombin Time 24.7 (H) 11.6 - 15.2 seconds   INR 2.25 (H) 0.00 - 1.49  CBC     Status: Abnormal   Collection Time: 04/07/15  5:58 AM  Result Value Ref Range   WBC 7.5 4.0 - 10.5 K/uL   RBC 3.10 (L) 3.87 - 5.11 MIL/uL   Hemoglobin 9.2 (L) 12.0 - 15.0 g/dL   HCT 29.4 (L) 36.0 - 46.0 %   MCV 94.8 78.0 - 100.0 fL   MCH 29.7 26.0 - 34.0 pg   MCHC 31.3 30.0 - 36.0 g/dL   RDW 17.8 (H) 11.5 - 15.5 %   Platelets 125 (L) 150 - 400 K/uL  Glucose, capillary     Status: Abnormal   Collection Time: 04/07/15  6:39 AM  Result Value Ref Range   Glucose-Capillary 127 (H) 65 - 99 mg/dL  Renal function panel     Status: Abnormal   Collection Time: 04/07/15  7:43 AM  Result Value Ref Range   Sodium 126 (L) 135 - 145 mmol/L   Potassium 3.5 3.5 - 5.1 mmol/L   Chloride 89 (L) 101 - 111 mmol/L   CO2 25 22 - 32 mmol/L   Glucose, Bld 122 (H) 65 - 99 mg/dL   BUN 31 (H) 6 - 20 mg/dL   Creatinine, Ser 7.87 (H) 0.44 - 1.00 mg/dL   Calcium 8.1 (L) 8.9 - 10.3 mg/dL   Phosphorus 3.3 2.5 - 4.6 mg/dL   Albumin 2.3 (L) 3.5 - 5.0 g/dL   GFR calc non Af Amer 6 (L) >60 mL/min   GFR calc Af Amer 7 (L) >60 mL/min    Comment: (NOTE) The eGFR has been calculated using the CKD EPI equation. This calculation has not been validated in all clinical situations. eGFR's persistently <60 mL/min signify possible Chronic Kidney Disease.    Anion gap 12 5 - 15  Vancomycin, random  Status: None   Collection Time: 04/07/15  7:47 AM  Result Value Ref Range   Vancomycin Rm 20 ug/mL    Comment:        Random Vancomycin therapeutic range is dependent on dosage and time of specimen collection. A peak range is 20.0-40.0 ug/mL A trough range is 5.0-15.0 ug/mL          Glucose, capillary     Status: Abnormal    Collection Time: 04/07/15 11:13 AM  Result Value Ref Range   Glucose-Capillary 153 (H) 65 - 99 mg/dL   No results found.     Assessment/Plan 1. Lymphadenopathy, unknown etiology -Dr. Kieth Brightly nor I palpated any nodes that were large enough to feel in order to be able to obtain a biopsy.  Unfortunately, we have nothing further to offer this patient.  Further work up will need to be determined per primary service.  Alexsandro Salek E 04/07/2015, 1:11 PM Pager: [phone]

## 2015-04-07 NOTE — Progress Notes (Signed)
Initial Nutrition Assessment  REASON FOR ASSESSMENT:   Consult Assessment of nutrition requirement/status  Wanted snacks for pt  ASSESSMENT:   Patient is a 43 y.o female with PMH of HTN, DM type 1 with multiple admissions for DKA, DVT on coumadin, GERD, asthma, ESRD s/p failed renal transplant now on HD p/w neck swelling * 2 weeks. Patient states approx 2 weeks ago she noted left supraclavicular swelling which has decreased slightly in size since then but 2-3 days ago she noted R supraclavicular swelling which was painful, tender to palpation  Pt reports good appetite, stable wt per chart. On HD, consulted to provide snacks for pt. Reported she did not like NePro. Pt stated if it is strawberry NePro she will drink it. Will provide along with cheese crackers to help intake.  Diet Order:  Diet - low sodium heart healthy Diet Heart Room service appropriate?: Yes; Fluid consistency:: Thin  Skin:  Reviewed, no issues  Last BM:  04/05/2015  Height:   Ht Readings from Last 1 Encounters:  03/31/15 5\' 1"  (1.549 m)    Weight:   Wt Readings from Last 1 Encounters:  04/07/15 145 lb 4.5 oz (65.9 kg)    Ideal Body Weight:  47.72 kg  BMI:  Body mass index is 27.47 kg/(m^2).  Estimated Nutritional Needs:   Kcal:  2050-2400 calories  Protein:  82 - 102 grams  Fluid:  Urine output +1000cc  [name] Anis. Shell Blanchette, MS, RD LDN After Hours/Weekend Pager [phone]

## 2015-04-07 NOTE — Progress Notes (Signed)
[facility] for Infectious Disease   Reason for visit: Follow up on fever, CoNS bacteremia  Interval History: now with Strep viridans and new GNR in blood cultures along with persistent CoNS.  No source identified.  Fever, some 'cold feeling'.    Physical Exam: Constitutional:  Filed Vitals:   04/07/15 1114  BP: 111/53  Pulse: 92  Temp: 97.9 F (36.6 C)  Resp: 16   patient appears in NAD Eyes: anicteric HENT: no thrush Respiratory: Normal respiratory effort; CTA B Cardiovascular: RRR GI: soft, nt  Review of Systems: Constitutional: negative for chills Cardiovascular: negative for chest pain Gastrointestinal: negative for nausea and diarrhea   Lab Results  Component Value Date   WBC 7.5 04/07/2015   HGB 9.2* 04/07/2015   HCT 29.4* 04/07/2015   MCV 94.8 04/07/2015   PLT 125* 04/07/2015    Lab Results  Component Value Date   CREATININE 7.87* 04/07/2015   BUN 31* 04/07/2015   NA 126* 04/07/2015   K 3.5 04/07/2015   CL 89* 04/07/2015   CO2 25 04/07/2015    Lab Results  Component Value Date   ALT 15 03/31/2015   AST 32 03/31/2015   ALKPHOS 174* 03/31/2015     Microbiology: Recent Results (from the past 240 hour(s))  Culture, blood (routine x 2)     Status: None   Collection Time: 04/01/15  7:50 PM  Result Value Ref Range Status   Specimen Description BLOOD RIGHT ARM  Final   Special Requests IN PEDIATRIC BOTTLE 1CC  Final   Culture  Setup Time   Final    GRAM POSITIVE COCCI IN CLUSTERS AEROBIC BOTTLE ONLY CRITICAL RESULT CALLED TO, READ BACK BY AND VERIFIED WITH: A RIO 04/02/15 @ 46 M VESTAL    Culture STAPHYLOCOCCUS SPECIES (COAGULASE NEGATIVE)  Final   Report Status 04/04/2015 FINAL  Final   Organism ID, Bacteria STAPHYLOCOCCUS SPECIES (COAGULASE NEGATIVE)  Final      Susceptibility   Staphylococcus species (coagulase negative) - MIC*    CIPROFLOXACIN >=8 RESISTANT Resistant     ERYTHROMYCIN >=8 RESISTANT Resistant     GENTAMICIN <=0.5  SENSITIVE Sensitive     OXACILLIN >=4 RESISTANT Resistant     TETRACYCLINE >=16 RESISTANT Resistant     VANCOMYCIN 1 SENSITIVE Sensitive     TRIMETH/SULFA 160 RESISTANT Resistant     CLINDAMYCIN <=0.25 SENSITIVE Sensitive     RIFAMPIN <=0.5 SENSITIVE Sensitive     Inducible Clindamycin NEGATIVE Sensitive     * STAPHYLOCOCCUS SPECIES (COAGULASE NEGATIVE)  Culture, blood (routine x 2)     Status: None   Collection Time: 04/01/15  7:56 PM  Result Value Ref Range Status   Specimen Description BLOOD LEFT HAND  Final   Special Requests IN PEDIATRIC BOTTLE 2CC  Final   Culture  Setup Time   Final    GRAM POSITIVE COCCI IN CLUSTERS AEROBIC BOTTLE ONLY CRITICAL RESULT CALLED TO, READ BACK BY AND VERIFIED WITH: A RIO 04/02/15 @ 1321 M VESTAL    Culture   Final    STAPHYLOCOCCUS SPECIES (COAGULASE NEGATIVE) SUSCEPTIBILITIES PERFORMED ON PREVIOUS CULTURE WITHIN THE LAST 5 DAYS.    Report Status 04/04/2015 FINAL  Final  Culture, blood (routine x 2)     Status: None (Preliminary result)   Collection Time: 04/03/15 11:45 AM  Result Value Ref Range Status   Specimen Description BLOOD RIGHT HAND  Final   Special Requests IN PEDIATRIC BOTTLE 4CC  Final   Culture  Setup Time   Final    GRAM POSITIVE COCCI IN CLUSTERS CRITICAL RESULT CALLED TO, READ BACK BY AND VERIFIED WITH: Louanne Skye 10:25 04/04/15 ([email or web address])    Culture   Final    STAPHYLOCOCCUS SPECIES (COAGULASE NEGATIVE) SUSCEPTIBILITIES PERFORMED ON PREVIOUS CULTURE WITHIN THE LAST 5 DAYS. GRAM NEGATIVE RODS CRITICAL RESULT CALLED TO, READ BACK BY AND VERIFIED WITH: DR Dareen Piano  04/07/15 @ 3 M VESTAL    Report Status PENDING  Incomplete  Culture, blood (routine x 2)     Status: None (Preliminary result)   Collection Time: 04/03/15  7:35 PM  Result Value Ref Range Status   Specimen Description BLOOD RIGHT ANTECUBITAL  Final   Special Requests BOTTLES DRAWN AEROBIC ONLY 8CC  Final   Culture NO GROWTH 4 DAYS  Final   Report Status  PENDING  Incomplete  Tissue culture     Status: None (Preliminary result)   Collection Time: 04/04/15  5:15 PM  Result Value Ref Range Status   Specimen Description TISSUE  Final   Special Requests HEAD NECK LYMPHADENOPATHY  Final   Gram Stain   Final    NO WBC SEEN NO ORGANISMS SEEN Performed at [facility]    Culture   Final    NO GROWTH 2 DAYS Performed at [facility]    Report Status PENDING  Incomplete  Culture, blood (routine x 2)     Status: None (Preliminary result)   Collection Time: 04/05/15 12:22 AM  Result Value Ref Range Status   Specimen Description BLOOD RIGHT HAND  Final   Special Requests IN PEDIATRIC BOTTLE 3CC  Final   Culture  Setup Time   Final    GRAM POSITIVE COCCI IN CHAINS IN PAIRS AEROBIC BOTTLE ONLY CRITICAL RESULT CALLED TO, READ BACK BY AND VERIFIED WITHDwaine Gale RN [id number] 04/05/15 A BROWN[place]    Culture   Final    VIRIDANS STREPTOCOCCUS SUSCEPTIBILITIES TO FOLLOW STAPHYLOCOCCUS SPECIES (COAGULASE NEGATIVE) SUSCEPTIBILITIES PERFORMED ON PREVIOUS CULTURE WITHIN THE LAST 5 DAYS.    Report Status PENDING  Incomplete  Culture, blood (routine x 2)     Status: None (Preliminary result)   Collection Time: 04/05/15 12:37 AM  Result Value Ref Range Status   Specimen Description BLOOD RIGHT HAND  Final   Special Requests BOTTLES DRAWN AEROBIC AND ANAEROBIC 4CC  Final   Culture NO GROWTH 2 DAYS  Final   Report Status PENDING  Incomplete    Impression:  1. Multiorganism bacteremia of unknown etiology.  Source elusive.  She has some minimal ascites which could account for the different organisms but exam is benign for SBP.  Oral cavity could be source.  Agree with panorex.  Only other possibility I can think of is injection drug use, though does not have a picc line.  2. Neck adenopathy, FNA insufficent cells.  Unable to do excisional bx  Plan: 1. Continue with vancomycin, levaquin 2. panorex 3. Could do abdominal ultrasound to see if  ascites is significant, enough to tap.

## 2015-04-07 NOTE — Progress Notes (Signed)
Subjective:  Hospital day 7  [name] was receiving dialysis today when I saw her this morning.  Overnight she was noted to again spike a fever to 102.61F and was at that time hypotensive to 88/45, but was asymptomatic at that time per nursing notes, and patient gave no report of problems overnight.  She also had an episode of hypoglycemia to 41 around 5AM.  She reports she had little appetite at dinner last night but tried to eat. No complaints of N/V/D.  She says she is feeling somewhat better and she thinks her cough "sounds better" than it did yesterday. Denies SOB at rest.  States she was able to get up and take a shower yesterday without any dizziness or lightheadedness.  She continues to have right-sided chest wall pain that is worse when palpated.  When asked if she is having any mouth or dental pain, patient denies, but notes that one of her front teeth "chipped" two weeks ago.  Denies any feeling of calf pain.  Objective: Vital signs  Filed Vitals:   04/07/15 0830 04/07/15 0900 04/07/15 0930 04/07/15 1000  BP: 110/54 118/59 133/63 127/58  Pulse: 78 79 90 90  Temp:      TempSrc:      Resp: 16 15 16 15   Height:      Weight:      SpO2:       Weight change:   Filed Weights   04/05/15 1610 04/05/15 2119 04/07/15 0715  Weight: 66.3 kg (146 lb 2.6 oz) 66.5 kg (146 lb 9.7 oz) 68.4 kg (150 lb 12.7 oz)    Intake/Output Summary (Last 24 hours) at 04/07/15 1058 Last data filed at 04/07/15 0600  Gross per 24 hour  Intake    840 ml  Output      0 ml  Net    840 ml   Physical exam Vitals Blood pressure 127/58, pulse 90, temperature 98.2 F (36.8 C),  RR 15,  height 5\' 1"  (1.549 m), weight 68.4 kg (150 lb 12.7 oz),  SpO2 100 % on 2L [place]  General Chronically-ill appearing asleep while receiving dialysis, in NAD.  Easily rouses to voice.  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  Sclera are muddy but anicteric.  Patient has evidence of  multiple dental caries and poor dentition on very limited oral exam (missing several back lower molars bilaterally, has chipped left central incisor) No obvious ulcerations in oral mucosa.  No tenderness when mandible palpated.    Neck Supraclavicular nodes are still present.  No obvious JVD.  ender to palpation on right chest wall in the upper pectoralis muscle.  There is no obvious erythema, skin breakdown, warmth or raised area to suggest infection or foreign body  Cardiovascular Audible holosystolic murmur, best appreciated at the LLSB, however, also audible at bilateral upper sternal borders.  Regular rate and rhythm.  Pulmonary Patient has normal work of breathing while at rest.  Upper airway breath sounds are referred throughout the upper posterior and anterior lung fields.  Exam somewhat limited by patient's position.  Abdomen Soft, nontender, nondistended  Skin Deferred    Neuro Alert and oriented to person and place; deferred   Lab Results: CMP Latest Ref Rng 04/07/2015 04/06/2015 04/05/2015  Glucose 65 - 99 mg/dL 122(H) 532(H) 254(H)  BUN 6 - 20 mg/dL 31(H) 23(H) 49(H)  Creatinine 0.44 - 1.00 mg/dL 7.87(H) 5.97(H) 10.31(H)  Sodium 135 - 145 mmol/L 126(L) 129(L)  128(L)  Potassium 3.5 - 5.1 mmol/L 3.5 4.5 5.1  Chloride 101 - 111 mmol/L 89(L) 90(L) 93(L)  CO2 22 - 32 mmol/L 25 25 19(L)  Calcium 8.9 - 10.3 mg/dL 8.1(L) 8.2(L) 8.2(L)  Total Protein 6.5 - 8.1 g/dL - - -  Total Bilirubin 0.3 - 1.2 mg/dL - - -  Alkaline Phos 38 - 126 U/L - - -  AST 15 - 41 U/L - - -  ALT 14 - 54 U/L - - -   CBC Latest Ref Rng 04/07/2015 04/06/2015 04/05/2015  WBC 4.0 - 10.5 K/uL 7.5 6.8 7.0  Hemoglobin 12.0 - 15.0 g/dL 9.2(L) 8.8(L) 8.8(L)  Hematocrit 36.0 - 46.0 % 29.4(L) 29.5(L) 28.3(L)  Platelets 150 - 400 K/uL 125(L) 180 164   Micro Results: Recent Results (from the past 240 hour(s))  Culture, blood (routine x 2)     Status: None   Collection Time: 04/01/15  7:50 PM  Result Value Ref Range Status    Specimen Description BLOOD RIGHT ARM  Final   Special Requests IN PEDIATRIC BOTTLE 1CC  Final   Culture  Setup Time   Final    GRAM POSITIVE COCCI IN CLUSTERS AEROBIC BOTTLE ONLY CRITICAL RESULT CALLED TO, READ BACK BY AND VERIFIED WITH: A RIO 04/02/15 @ 8 M VESTAL    Culture STAPHYLOCOCCUS SPECIES (COAGULASE NEGATIVE)  Final   Report Status 04/04/2015 FINAL  Final   Organism ID, Bacteria STAPHYLOCOCCUS SPECIES (COAGULASE NEGATIVE)  Final      Susceptibility   Staphylococcus species (coagulase negative) - MIC*    CIPROFLOXACIN >=8 RESISTANT Resistant     ERYTHROMYCIN >=8 RESISTANT Resistant     GENTAMICIN <=0.5 SENSITIVE Sensitive     OXACILLIN >=4 RESISTANT Resistant     TETRACYCLINE >=16 RESISTANT Resistant     VANCOMYCIN 1 SENSITIVE Sensitive     TRIMETH/SULFA 160 RESISTANT Resistant     CLINDAMYCIN <=0.25 SENSITIVE Sensitive     RIFAMPIN <=0.5 SENSITIVE Sensitive     Inducible Clindamycin NEGATIVE Sensitive     * STAPHYLOCOCCUS SPECIES (COAGULASE NEGATIVE)  Culture, blood (routine x 2)     Status: None   Collection Time: 04/01/15  7:56 PM  Result Value Ref Range Status   Specimen Description BLOOD LEFT HAND  Final   Special Requests IN PEDIATRIC BOTTLE 2CC  Final   Culture  Setup Time   Final    GRAM POSITIVE COCCI IN CLUSTERS AEROBIC BOTTLE ONLY CRITICAL RESULT CALLED TO, READ BACK BY AND VERIFIED WITH: A RIO 04/02/15 @ 1321 M VESTAL    Culture   Final    STAPHYLOCOCCUS SPECIES (COAGULASE NEGATIVE) SUSCEPTIBILITIES PERFORMED ON PREVIOUS CULTURE WITHIN THE LAST 5 DAYS.    Report Status 04/04/2015 FINAL  Final  Culture, blood (routine x 2)     Status: None (Preliminary result)   Collection Time: 04/03/15 11:45 AM  Result Value Ref Range Status   Specimen Description BLOOD RIGHT HAND  Final   Special Requests IN PEDIATRIC BOTTLE 4CC  Final   Culture  Setup Time   Final    GRAM POSITIVE COCCI IN CLUSTERS CRITICAL RESULT CALLED TO, READ BACK BY AND VERIFIED WITH: Louanne Skye 10:25 04/04/15 ([email or web address])    Culture   Final    STAPHYLOCOCCUS SPECIES (COAGULASE NEGATIVE) SUSCEPTIBILITIES PERFORMED ON PREVIOUS CULTURE WITHIN THE LAST 5 DAYS.    Report Status PENDING  Incomplete  Culture, blood (routine x 2)     Status: None (Preliminary result)   Collection Time: 04/03/15  7:35 PM  Result Value Ref Range Status   Specimen Description BLOOD RIGHT ANTECUBITAL  Final   Special Requests BOTTLES DRAWN AEROBIC ONLY 8CC  Final   Culture NO GROWTH 3 DAYS  Final   Report Status PENDING  Incomplete  Tissue culture     Status: None (Preliminary result)   Collection Time: 04/04/15  5:15 PM  Result Value Ref Range Status   Specimen Description TISSUE  Final   Special Requests HEAD NECK LYMPHADENOPATHY  Final   Gram Stain   Final    NO WBC SEEN NO ORGANISMS SEEN Performed at [facility]    Culture   Final    NO GROWTH 2 DAYS Performed at [facility]    Report Status PENDING  Incomplete  Culture, blood (routine x 2)     Status: None (Preliminary result)   Collection Time: 04/05/15 12:22 AM  Result Value Ref Range Status   Specimen Description BLOOD RIGHT HAND  Final   Special Requests IN PEDIATRIC BOTTLE 3CC  Final   Culture  Setup Time   Final    GRAM POSITIVE COCCI IN CHAINS IN PAIRS AEROBIC BOTTLE ONLY CRITICAL RESULT CALLED TO, READ BACK BY AND VERIFIED WITHDwaine Gale RN [id number] 04/05/15 A BROWNING    Culture   Final    VIRIDANS STREPTOCOCCUS SUSCEPTIBILITIES TO FOLLOW STAPHYLOCOCCUS SPECIES (COAGULASE NEGATIVE) SUSCEPTIBILITIES PERFORMED ON PREVIOUS CULTURE WITHIN THE LAST 5 DAYS.    Report Status PENDING  Incomplete  Culture, blood (routine x 2)     Status: None (Preliminary result)   Collection Time: 04/05/15 12:37 AM  Result Value Ref Range Status   Specimen Description BLOOD RIGHT HAND  Final   Special Requests BOTTLES DRAWN AEROBIC AND ANAEROBIC 4CC  Final   Culture NO GROWTH 1 DAY  Final   Report Status PENDING  Incomplete     Studies/Results: No results found. TEE 11/8: Study Conclusions - Left ventricle: Systolic function was normal. The estimated ejection fraction was in the range of 60% to 65%. Wall motion was normal; there were no regional wall motion abnormalities. - Aortic valve: No evidence of vegetation. There was trivial regurgitation. - Mitral valve: No evidence of vegetation. There was mild regurgitation. - Left atrium: No evidence of thrombus in the atrial cavity or appendage. - Right atrium: No evidence of thrombus in the atrial cavity or appendage. - Atrial septum: No defect or patent foramen ovale was identified. There was an atrial septal aneurysm. - Tricuspid valve: There was moderate regurgitation.  Impressions: - No evidence of thrombus or vegetation.  Medications: I have reviewed the patient's current medications. Scheduled Meds: . calcium acetate  667 mg Oral BID WC  . clonazePAM  1 mg Oral QHS  . darbepoetin (ARANESP) injection - DIALYSIS  200 mcg Intravenous Q Thu-HD  . dextromethorphan-guaiFENesin  1 tablet Oral BID  . dextrose  1 ampule Intravenous Once  . doxercalciferol      . doxercalciferol  1 mcg Intravenous Q T,Th,Sa-HD  . enoxaparin  70 mg Subcutaneous Q24H  . feeding supplement (NEPRO CARB STEADY)  237 mL Oral BID BM  . gabapentin  100 mg Oral Daily  . guaiFENesin  600 mg Oral BID  . insulin aspart  0-9 Units Subcutaneous TID WC  . insulin aspart  2 Units Subcutaneous TID WC  . insulin glargine  2 Units Subcutaneous BID  . [START ON 04/08/2015] levofloxacin (LEVAQUIN) IV  500 mg Intravenous Q48H  . lidocaine  1 patch  Transdermal Q24H  . metoCLOPramide  10 mg Oral TID AC & HS  . montelukast  10 mg Oral QHS  . multivitamin  1 tablet Oral Daily  . pantoprazole  40 mg Oral QHS  . simvastatin  20 mg Oral QHS  . vancomycin  750 mg Intravenous Q T,Th,Sa-HD  . 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  Of note, patient is very responsive to insulin and has been both hyperglycemic to >1000 on admission, and hypoglycemic to the 50s when restarted on home regimen and made NPO for procedures.  Watching glucose carefully.    #Lymphadenopathy suspected 2/2 lymphoma vs mets: pt has bilateral supraclavicular node lymphadenopathy and CT showing mediastinal lymphadenopathy with spiculated mass in right pectoralis major, 11/7 needle biopsy had insufficient tissue for dx -General surgery to see patient 11/10 to eval for excisional biopsy of node, pt will need to be NPO per their recs  -CT abd/pelvis obtained evening of 11/4 showed stomach wall thickening suggestive of possible diffuse neoplastic process, correlate with pathology as needed.  Consider outpatient vs inpatient EGD if node biopsy does not yield answer  #Bacteremia with unclear source, multiple organisms: oxacillin-resistant coag-neg staph 2/2 on 11/6 plate, Repeat culture 11/8 had coag-neg staph AND strep viridans from aerobic bottle only. Informed  11/10 that a GNR colony started growing on 11/6 plate, awaiting speciation.  Source potentially vascular (multiple HD access site) vs oral (extensive dental decay) -Follow up speciation and sensitivities on blood cultures 11/6 and 11/8 -Spoke with [name], vascular surgery PA, who will evaluate patient's multiple HD access sites 11/10 -Follow-up on ID recommendations pending speciation of GNR -Consider calling dental consult to see patient in hospital if suspect oral source -Continue IV vanc and levaquin per pharmacy HD recommendations   #T1DM: very difficult to control, was hyperglycemic >1000 without signs of acidosis on admission and subsequently hypoglycemic episodes in the 50s-60s; need to monitor and adjust as needed -Was hypoglycemic 11/10 in AM; continue 2 units insulin aspart with meals and decrease Lantus from 4 to 2 Units BID -Continue SSI -Continue to monitor fingerstick and CBG  #SOB/cough: evidence of pulmonary congestion on chest imaging 11/5, unclear underlying etiology, although infection on ddx -Bacterial pneumonia is covered as pt is on vanc & levaquin for bacteremia, see above -Continue Mucinex 600mg  BID, singulair 10mg  daily at bedtime -O2 [place] titrated so that O2 is >88%  -PE was also on the initial differential, Wells score is 3 (DVT earlier this year, tachycardia as of 2:30PM), or 4 if one counts malignancy criteria; would consider CT angiogram if patient continues to have worsening desaturations or O2 requirements given prior DVT hx  and need to hold anticoagulation for several days earlier this admission  #GERD/esophageal dysmotility:  Had barrium swallow in 10/2014 showing evidence of dysmotility, 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 Outpatient EGD given CT findings of possible neoplastic process involving GI tract  #ESRD on T/Th/Sat HD -calcium acetate 667mg  BID with meals -doxercalciferol 38mcg  with HD -vanc with HD per pharmacy recommendations  #Pain: -Tylenol 650mg  q6 PRN -5mg  oxycodone increased from q6hrs PRN  #Mood: blunted affect observed in context of suspected cancer dx, however pt denies SIGEAPS symptoms outside the hospital -Recommend PCP reassessment of mood at f/u  -Would consider starting 25mg  sertraline x1 week and increasing to 50mg  if tolerated  #Anemia of chronic disease -250mcg Darbopoetin Alfa every Thursday during HD to stimulate bone marrow   #Prior DVT: warfarin was held 11/4 --> 11/8 for biopsy -Continue lovenox 70mg  q24hrs -11/8, Warfarin was restarted per pharmacy dosing   #Sleep: pt on 1mg  clonzaepam at home daily at bedtime -Continue home dose to avoid benzo withdrawal   #HTN -hold amlodipine and metoprolol given multiple episodes of low BPs at night  #HLD -continue simvastatin 20mg   #Dispo: deferred at this time -Will need outpatient follow up with heme/onc and PCP  This is a Careers information officer Note.  The care of the patient was discussed with Dr. Berline Lopes and the assessment and plan formulated with their assistance.  Please see their attached note for official documentation of the daily encounter.   LOS: 7 days   Ladell Pier, Med Student 04/07/2015, 10:58 AM

## 2015-04-07 NOTE — Progress Notes (Signed)
Hypoglycemic Event  CBG: 41  Treatment: 15 GM carbohydrate snack  Symptoms: None  Follow-up CBG: Time:0506 CBG Result:95  Possible Reasons for Event: Unknown  Comments/MD notified:Notified MD on-call. Will continue to monitor.    Georgiana Shore

## 2015-04-07 NOTE — Progress Notes (Signed)
Inpatient Diabetes Program Recommendations  AACE/ADA: New Consensus Statement on Inpatient Glycemic Control (2015)  Target Ranges:  Prepandial:   less than 140 mg/dL      Peak postprandial:   less than 180 mg/dL (1-2 hours)      Critically ill patients:  140 - 180 mg/dL   Review of Glycemic Control:  Results for DESSIE, PREVAL (MRN [id number]) as of 04/07/2015 14:36  Ref. Range 04/07/2015 04:17 04/07/2015 05:05 04/07/2015 06:39 04/07/2015 11:13 04/07/2015 13:59  Glucose-Capillary Latest Ref Range: 65-99 mg/dL 41 (LL) 95 127 (H) 153 (H) 307 (H)    Diabetes history: Type 1 diabetes-very sensitive to insulin Outpatient Diabetes medications: Lantus 7 units bid, Novolog 6 units bid Current orders for Inpatient glycemic control:  Lantus 2 units bid, Novolog 2 units tid with meals, Novolog sensitive tid with meals  Inpatient Diabetes Program Recommendations:    Called and discussed with MD.  Recommend less aggressive correction scale that starts at 151 mg/dL.  Also consider increasing AM dose of Lantus to 4 units and continue PM dose of Lantus 2 units.  Will follow.  Thanks, Adah Perl, RN, BC-ADM Inpatient Diabetes Coordinator Pager [phone] (8a-5p)

## 2015-04-07 NOTE — Procedures (Signed)
Patient seen on Hemodialysis. QB 350, UF goal 2.9L Treatment adjusted as needed.  [name] Shiley MD [facility]. Office # [phone] Pager # [phone] 9:38 AM

## 2015-04-07 NOTE — Progress Notes (Signed)
Patient seen and examined. Case d/w residents in detail. I agree with findings and plan as documented in Dr. Elon Jester note.  Patient with persistent fevers and with persistent coag neg staph in her blood cx and is now growing gram neg rods in her cx from 11/6 and is now growing strep viridans in 11/8 culture. Possibly dental source. Would get panorex. C/w vanco and levaquin for now. ID recommendations appreciated. Will get abd sono to see if significant ascites and get paracentesis if possible.  Vascular sx f/u appreciated. Unlikely patient has infected grafts.  Patient with lymphadenopathy of uncertain etiology possible malignancy. IR guided biopsy with normal LN tissue. Unable to get excisional LN per surgery as LN are too small. Resident discussed with GI regarding possible EGD but given persistent fevers and fluctuating blood sugars will need to hold off on EGD for now.

## 2015-04-08 ENCOUNTER — Inpatient Hospital Stay (HOSPITAL_COMMUNITY): Payer: Medicare Other

## 2015-04-08 DIAGNOSIS — R7881 Bacteremia: Secondary | ICD-10-CM

## 2015-04-08 LAB — GLUCOSE, CAPILLARY
GLUCOSE-CAPILLARY: 154 mg/dL — AB (ref 65–99)
GLUCOSE-CAPILLARY: 215 mg/dL — AB (ref 65–99)
GLUCOSE-CAPILLARY: 235 mg/dL — AB (ref 65–99)
GLUCOSE-CAPILLARY: 414 mg/dL — AB (ref 65–99)
GLUCOSE-CAPILLARY: 177 mg/dL — AB (ref 65–99)
Glucose-Capillary: 111 mg/dL — ABNORMAL HIGH (ref 65–99)
Glucose-Capillary: 216 mg/dL — ABNORMAL HIGH (ref 65–99)

## 2015-04-08 LAB — CULTURE, BLOOD (ROUTINE X 2): Culture: NO GROWTH

## 2015-04-08 LAB — TISSUE CULTURE
Culture: NO GROWTH
GRAM STAIN: NONE SEEN

## 2015-04-08 MED ORDER — OXYCODONE HCL 5 MG PO TABS
10.0000 mg | ORAL_TABLET | Freq: Once | ORAL | Status: AC
  Administered 2015-04-08: 10 mg via ORAL
  Filled 2015-04-08: qty 2

## 2015-04-08 MED ORDER — PENTAFLUOROPROP-TETRAFLUOROETH EX AERO: 1.0000 "application " | INHALATION_SPRAY | CUTANEOUS | Status: DC | PRN

## 2015-04-08 MED ORDER — SODIUM CHLORIDE 0.9 % IV SOLN: 100.0000 mL | INTRAVENOUS | Status: DC | PRN

## 2015-04-08 MED ORDER — DICLOFENAC SODIUM 1 % TD GEL
2.0000 g | Freq: Four times a day (QID) | TRANSDERMAL | Status: DC
  Administered 2015-04-08 – 2015-04-12 (×8): 2 g via TOPICAL
  Filled 2015-04-08: qty 100

## 2015-04-08 MED ORDER — OXYCODONE HCL 5 MG PO TABS
5.0000 mg | ORAL_TABLET | ORAL | Status: DC | PRN
  Administered 2015-04-08: 5 mg via ORAL
  Filled 2015-04-08 (×2): qty 1

## 2015-04-08 MED ORDER — ALTEPLASE 2 MG IJ SOLR
2.0000 mg | Freq: Once | INTRAMUSCULAR | Status: DC | PRN
  Filled 2015-04-08: qty 2

## 2015-04-08 MED ORDER — SODIUM CHLORIDE 0.9 % IV SOLN
250.0000 mg | Freq: Two times a day (BID) | INTRAVENOUS | Status: DC
  Administered 2015-04-08 – 2015-04-12 (×8): 250 mg via INTRAVENOUS
  Filled 2015-04-08 (×11): qty 250

## 2015-04-08 MED ORDER — OXYCODONE HCL 5 MG PO TABS: 5.0000 mg | ORAL_TABLET | Freq: Four times a day (QID) | ORAL | Status: DC | PRN

## 2015-04-08 MED ORDER — LIDOCAINE-PRILOCAINE 2.5-2.5 % EX CREA
1.0000 "application " | TOPICAL_CREAM | CUTANEOUS | Status: DC | PRN
  Filled 2015-04-08: qty 5

## 2015-04-08 MED ORDER — WARFARIN SODIUM 5 MG PO TABS
5.0000 mg | ORAL_TABLET | Freq: Once | ORAL | Status: AC
  Administered 2015-04-08: 5 mg via ORAL
  Filled 2015-04-08: qty 1

## 2015-04-08 MED ORDER — LIDOCAINE HCL (PF) 1 % IJ SOLN: 5.0000 mL | INTRAMUSCULAR | Status: DC | PRN

## 2015-04-08 MED ORDER — HEPARIN SODIUM (PORCINE) 1000 UNIT/ML DIALYSIS
1000.0000 [IU] | INTRAMUSCULAR | Status: DC | PRN
Start: 2015-04-08 — End: 2015-04-09

## 2015-04-08 MED ORDER — HEPARIN SODIUM (PORCINE) 1000 UNIT/ML DIALYSIS: 2000.0000 [IU] | INTRAMUSCULAR | Status: DC | PRN

## 2015-04-08 NOTE — Progress Notes (Signed)
   VASCULAR SURGERY ASSESSMENT & PLAN:  * Duplex shows a few small isolated areas of fluid around the grafts but no obvious signs of infection.  * Would not recommend removal of either graft unless there were signs of infection on exam.  SUBJECTIVE: No specific complaints  PHYSICAL EXAM: Filed Vitals:   04/07/15 1624 04/07/15 2029 04/08/15 0419 04/08/15 0849  BP: 112/50 100/54 83/43 114/51  Pulse: 96 87 98 97  Temp: 102.5 F (39.2 C) 99.8 F (37.7 C) 99.2 F (37.3 C) 99.3 F (37.4 C)  TempSrc: Oral Oral Oral Oral  Resp: 18 17 17 16   Height:      Weight:  145 lb 4.5 oz (65.9 kg)    SpO2: 98% 97% 99% 94%   Grafts both with good thrills and no signs of infection.  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

## 2015-04-08 NOTE — Progress Notes (Signed)
Patient ID: [name], female   DOB: [birth year], 44 y.o.   MRN: [id number]  [facility] Progress Note   Assessment/ Plan:   1. R chest and neck pain - fine-needle aspirate by interventional radiology showed normal lymphatic tissue-no palpable lymph node suitable for excisional biopsy noted. 2. MRSE bacteremia with persistent fevers, new GNR: No evidence of SBE on echocardiogram-on vancomycin and levofloxacin. Fever curve appearing to improve. 3. ESRD TTS - plans for next hemodialysis tomorrow-she does not have acute hemodialysis needs today 4. BP/volume- off BP meds; UF 2.5 on Saturday- with post weight 67.2 Net UF Tuesday 3.2 with post weight 0000000; keep systolic BP [id number] 5. 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 DM per primary - BS labile - diet is very high in simple carbs- needs complex carbs and protein 9. Nutrition alb 2.5- renal carb mod/renavit- intake variable - nutrition consult  Subjective:   Reports some right-sided neck pain and states that swelling appears to be improving. Was seen by surgery yesterday for possible excisional biopsy however, no palpable large lymph nodes/area for excisional biopsy. Also seen by vascular surgery for possible focus of infection-no areas noted.    Objective:   BP 114/51 mmHg  Pulse 97  Temp(Src) 99.3 F (37.4 C) (Oral)  Resp 16  Ht 5\' 1"  (1.549 m)  Wt 65.9 kg (145 lb 4.5 oz)  BMI 27.47 kg/m2  SpO2 94%  LMP 03/02/2015  Physical Exam: Gen: Comfortably sitting up on the side of her bed CVS: Pulse regular in rate and rhythm Resp: Clear to auscultation bilaterally-occasional expiratory wheeze Abd: Soft, obese, nontender Ext: No lower extremity  edema  Labs: BMET  Recent Labs Lab 04/02/15 0711 04/03/15 0221 04/05/15 0022 04/05/15 1112 04/06/15 0430 04/07/15 0743  NA 128* 132* 128* 128* 129* 126*  K 4.5 4.9 4.6 5.1 4.5 3.5  CL 94* 94* 92* 93* 90* 89*  CO2 22 24 23  19* 25 25  GLUCOSE 440* 50* 238* 254* 532* 122*  BUN 47* 22* 42* 49* 23* 31*  CREATININE 7.31* 4.94* 9.32* 10.31* 5.97* 7.87*  CALCIUM 8.2* 8.8* 8.2* 8.2* 8.2* 8.1*  PHOS  --  2.5  --  4.8*  --  3.3   CBC  Recent Labs Lab 04/02/15 0839 04/03/15 0221 04/05/15 0022 04/05/15 1112 04/06/15 0430 04/07/15 0558  WBC  --  9.5 6.8 7.0 6.8 7.5  NEUTROABS 5.8 6.4  --   --   --   --   HGB  --  10.3* 8.3* 8.8* 8.8* 9.2*  HCT  --  32.8* 27.5* 28.3* 29.5* 29.4*  MCV  --  94.3 93.9 95.3 94.9 94.8  PLT  --  203 165 164 180 125*   Medications:    . calcium acetate  667 mg Oral BID WC  . clonazePAM  1 mg Oral QHS  . darbepoetin (ARANESP) injection - DIALYSIS  200 mcg Intravenous Q Thu-HD  . dextromethorphan-guaiFENesin  1 tablet Oral BID  . dextrose  1 ampule Intravenous Once  . diclofenac sodium  2 g Topical QID  . doxercalciferol  1 mcg Intravenous Q T,Th,Sa-HD  . feeding supplement (NEPRO CARB STEADY)  237 mL Oral BID BM  . gabapentin  100 mg Oral Daily  .  guaiFENesin  600 mg Oral BID  . insulin aspart  0-5 Units Subcutaneous TID WC  . insulin aspart  2 Units Subcutaneous TID WC  . insulin glargine  2 Units Subcutaneous Q2200  . insulin glargine  4 Units Subcutaneous q morning - 10a  . levofloxacin (LEVAQUIN) IV  500 mg Intravenous Q48H  . lidocaine  1 patch Transdermal Q24H  . metoCLOPramide  10 mg Oral TID AC & HS  . montelukast  10 mg Oral QHS  . multivitamin  1 tablet Oral Daily  . pantoprazole  40 mg Oral QHS  . simvastatin  20 mg Oral QHS  . vancomycin  750 mg Intravenous Q T,Th,Sa-HD  . warfarin  5 mg Oral ONCE-1800  . Warfarin - Pharmacist Dosing Inpatient   Does not apply [id number]   Elmarie Shiley, MD 04/08/2015, 10:29 AM

## 2015-04-08 NOTE — Progress Notes (Signed)
[facility]   Reason for visit: Follow up on fever, CoNS bacteremia  Interval History: Strep viridans, Pseudomonas fluorescens in blood cultures along with persistent CoNS.  No source identified.  Fever.   Physical Exam: Constitutional:  Filed Vitals:   04/08/15 0849  BP: 114/51  Pulse: 97  Temp: 99.3 F (37.4 C)  Resp: 16   patient appears in NAD Eyes: anicteric HENT: no thrush Respiratory: Normal respiratory effort; CTA B Cardiovascular: RRR GI: soft, nt  Review of Systems: Constitutional: negative for chills Cardiovascular: negative for chest pain Gastrointestinal: negative for nausea and diarrhea   Lab Results  Component Value Date   WBC 7.5 04/07/2015   HGB 9.2* 04/07/2015   HCT 29.4* 04/07/2015   MCV 94.8 04/07/2015   PLT 125* 04/07/2015    Lab Results  Component Value Date   CREATININE 7.87* 04/07/2015   BUN 31* 04/07/2015   NA 126* 04/07/2015   K 3.5 04/07/2015   CL 89* 04/07/2015   CO2 25 04/07/2015    Lab Results  Component Value Date   ALT 15 03/31/2015   AST 32 03/31/2015   ALKPHOS 174* 03/31/2015     Microbiology: Recent Results (from the past 240 hour(s))  Culture, blood (routine x 2)     Status: None   Collection Time: 04/01/15  7:50 PM  Result Value Ref Range Status   Specimen Description BLOOD RIGHT ARM  Final   Special Requests IN PEDIATRIC BOTTLE 1CC  Final   Culture  Setup Time   Final    GRAM POSITIVE COCCI IN CLUSTERS AEROBIC BOTTLE ONLY CRITICAL RESULT CALLED TO, READ BACK BY AND VERIFIED WITH: A RIO 04/02/15 @ 4 M VESTAL    Culture STAPHYLOCOCCUS SPECIES (COAGULASE NEGATIVE)  Final   Report Status 04/04/2015 FINAL  Final   Organism ID, Bacteria STAPHYLOCOCCUS SPECIES (COAGULASE NEGATIVE)  Final      Susceptibility   Staphylococcus species (coagulase negative) - MIC*    CIPROFLOXACIN >=8 RESISTANT Resistant     ERYTHROMYCIN >=8 RESISTANT Resistant     GENTAMICIN <=0.5 SENSITIVE Sensitive       OXACILLIN >=4 RESISTANT Resistant     TETRACYCLINE >=16 RESISTANT Resistant     VANCOMYCIN 1 SENSITIVE Sensitive     TRIMETH/SULFA 160 RESISTANT Resistant     CLINDAMYCIN <=0.25 SENSITIVE Sensitive     RIFAMPIN <=0.5 SENSITIVE Sensitive     Inducible Clindamycin NEGATIVE Sensitive     * STAPHYLOCOCCUS SPECIES (COAGULASE NEGATIVE)  Culture, blood (routine x 2)     Status: None   Collection Time: 04/01/15  7:56 PM  Result Value Ref Range Status   Specimen Description BLOOD LEFT HAND  Final   Special Requests IN PEDIATRIC BOTTLE 2CC  Final   Culture  Setup Time   Final    GRAM POSITIVE COCCI IN CLUSTERS AEROBIC BOTTLE ONLY CRITICAL RESULT CALLED TO, READ BACK BY AND VERIFIED WITH: A RIO 04/02/15 @ 1321 M VESTAL    Culture   Final    STAPHYLOCOCCUS SPECIES (COAGULASE NEGATIVE) SUSCEPTIBILITIES PERFORMED ON PREVIOUS CULTURE WITHIN THE LAST 5 DAYS.    Report Status 04/04/2015 FINAL  Final  Culture, blood (routine x 2)     Status: None (Preliminary result)   Collection Time: 04/03/15 11:45 AM  Result Value Ref Range Status   Specimen Description BLOOD RIGHT HAND  Final   Special Requests IN PEDIATRIC BOTTLE 4CC  Final   Culture  Setup Time   Final  GRAM POSITIVE COCCI IN CLUSTERS CRITICAL RESULT CALLED TO, READ BACK BY AND VERIFIED WITH: Louanne Skye 10:25 04/04/15 ([email or web address])    Culture   Final    STAPHYLOCOCCUS SPECIES (COAGULASE NEGATIVE) SUSCEPTIBILITIES PERFORMED ON PREVIOUS CULTURE WITHIN THE LAST 5 DAYS. PSEUDOMONAS FLUORESCENS CRITICAL RESULT CALLED TO, READ BACK BY AND VERIFIED WITH: DR Dareen Piano  04/07/15 @ 1113 M VESTAL    Report Status PENDING  Incomplete   Organism ID, Bacteria PSEUDOMONAS FLUORESCENS  Final      Susceptibility   Pseudomonas fluorescens - MIC*    CEFTAZIDIME 16 INTERMEDIATE Intermediate     CIPROFLOXACIN >=4 RESISTANT Resistant     GENTAMICIN <=1 SENSITIVE Sensitive     IMIPENEM 0.5 SENSITIVE Sensitive     * PSEUDOMONAS FLUORESCENS  Culture, blood  (routine x 2)     Status: None (Preliminary result)   Collection Time: 04/03/15  7:35 PM  Result Value Ref Range Status   Specimen Description BLOOD RIGHT ANTECUBITAL  Final   Special Requests BOTTLES DRAWN AEROBIC ONLY 8CC  Final   Culture NO GROWTH 4 DAYS  Final   Report Status PENDING  Incomplete  Tissue culture     Status: None   Collection Time: 04/04/15  5:15 PM  Result Value Ref Range Status   Specimen Description TISSUE  Final   Special Requests HEAD NECK LYMPHADENOPATHY  Final   Gram Stain   Final    NO WBC SEEN NO ORGANISMS SEEN Performed at [facility]    Culture   Final    NO GROWTH 3 DAYS Performed at [facility]    Report Status 04/08/2015 FINAL  Final  Culture, blood (routine x 2)     Status: None (Preliminary result)   Collection Time: 04/05/15 12:22 AM  Result Value Ref Range Status   Specimen Description BLOOD RIGHT HAND  Final   Special Requests IN PEDIATRIC BOTTLE 3CC  Final   Culture  Setup Time   Final    GRAM POSITIVE COCCI IN CHAINS IN PAIRS AEROBIC BOTTLE ONLY CRITICAL RESULT CALLED TO, READ BACK BY AND VERIFIED WITHDwaine Gale RN [id number] 04/05/15 A BROWNING    Culture   Final    VIRIDANS STREPTOCOCCUS SUSCEPTIBILITIES TO FOLLOW STAPHYLOCOCCUS SPECIES (COAGULASE NEGATIVE) SUSCEPTIBILITIES PERFORMED ON PREVIOUS CULTURE WITHIN THE LAST 5 DAYS.    Report Status PENDING  Incomplete  Culture, blood (routine x 2)     Status: None (Preliminary result)   Collection Time: 04/05/15 12:37 AM  Result Value Ref Range Status   Specimen Description BLOOD RIGHT HAND  Final   Special Requests BOTTLES DRAWN AEROBIC AND ANAEROBIC 4CC  Final   Culture NO GROWTH 2 DAYS  Final   Report Status PENDING  Incomplete    Impression:  1. Multiorganism bacteremia of unknown etiology.  With the different organisms, including Pseudomonas fluorescens, this suggests injecting, unless something is contaminated, though no other Pseudomonas fluorescens that I am  aware of.  I do think the Pseudomonas can cause disease and is worth treating, has been associated with bacteremia.   2. Neck adenopathy, FNA insufficent cells.  Unable to do excisional bx  Plan: 1. Continue with vancomycin 2. I will change to imipenem to assure clearance of Pseudomonas

## 2015-04-08 NOTE — Progress Notes (Signed)
Physical Therapy Treatment Patient Details Name: [name] MRN: [id number] DOB: [birth year] Today's Date: 04/08/2015    History of Present Illness Patient is a 44 y.o female with PMH of HTN, DM type 1 with multiple admissions for DKA, DVT on coumadin, GERD, asthma, ESRD s/p failed renal transplant. Admitted for Diffuse lymphadenopathy.    PT Comments    Patient is progressing well towards physical therapy goals, ambulating up to 225 feet without physical assist today. SpO2 87% on room air, improves with pursed lip breathing to lower 90s. Denies SOB. States she uses home O2 "as needed." Patient will continue to benefit from skilled physical therapy services to further improve independence with functional mobility.    Follow Up Recommendations  Home health PT;Supervision for mobility/OOB     Equipment Recommendations  None recommended by PT    Recommendations for Other Services OT consult     Precautions / Restrictions Precautions Precautions: Fall Restrictions Weight Bearing Restrictions: No    Mobility  Bed Mobility               General bed mobility comments: standing when PT entered room  Transfers                 General transfer comment: Standing when PT entered room  Ambulation/Gait Ambulation/Gait assistance: Min guard Ambulation Distance (Feet): 225 Feet Assistive device: None Gait Pattern/deviations: Step-through pattern;Decreased stance time - left;Staggering left;Drifts right/left Gait velocity: decreased Gait velocity interpretation: Below normal speed for age/gender General Gait Details: Improved balance today. Intermittent stagger, minimal, and able to self correct. Tolerated backwards stepping task safely. SpO2 did drop to 87% on room air. Improved to low 90s with pursed lip breathing and 94% while sitting on room air. Cues for pursed lip breathing and stability awareness.   Stairs            Wheelchair Mobility    Modified  Rankin (Stroke Patients Only)       Balance                                    Cognition Arousal/Alertness: Awake/alert Behavior During Therapy: WFL for tasks assessed/performed Overall Cognitive Status: Within Functional Limits for tasks assessed                      Exercises      General Comments        Pertinent Vitals/Pain Pain Assessment: No/denies pain    Home Living                      Prior Function            PT Goals (current goals can now be found in the care plan section) Acute Rehab PT Goals Patient Stated Goal: none stated PT Goal Formulation: With patient Time For Goal Achievement: 04/20/15 Potential to Achieve Goals: Good Progress towards PT goals: Progressing toward goals    Frequency  Min 3X/week    PT Plan Current plan remains appropriate    Co-evaluation             End of Session Equipment Utilized During Treatment: Gait belt Activity Tolerance: Patient tolerated treatment well Patient left: with call bell/phone within reach (sitting EOB)     Time: [id number] PT Time Calculation (min) (ACUTE ONLY): 11 min  Charges:  $Gait Training: 8-22 mins  G CodesEllouise Newer Apr 21, 2015, 3:03 PM Elayne Snare, [place]

## 2015-04-08 NOTE — Progress Notes (Signed)
Patient seen and examined. Case d/w residents in detail. I agree with findings and plan as documented in Dr. Elon Jester note.  Patient feels well today. Cough is improving. Still with persistent fevers. Blood cx with persistent coag neg staph and now with pseudomonas (11/6) and strep viridans (11/8). ID follow up appreciated. Uncertain etiology of bacteremia. C/w vanco. Started on imipenem today for pseudomonas fluorescens. Will get tagged WBC scan to assess for source of bacteremia. It is possible that patient is injecting a foreign substance given multiple bacteria in blood.   Will need EGD with biopsy to assess for malignancy given multiple LN enlargement and gastric mucosal thickening. Will recall GI once infection is resolved and blood glucose has improved.

## 2015-04-08 NOTE — Care Management Important Message (Signed)
Important Message  Patient Details  Name: [name] MRN: [id number] Date of Birth: 19-Aug-1970   Medicare Important Message Given:  Yes    Wrigley Plasencia P Indiyah Paone 04/08/2015, 2:42 PM

## 2015-04-08 NOTE — Progress Notes (Signed)
ANTICOAGULATION CONSULT NOTE - Follow Up Consult  Pharmacy Consult for Coumadin Indication: hx LLE  DVT (12/2014)   Allergies  Allergen Reactions  . Depakote [Divalproex Sodium] Other (See Comments)    Pt gets "delirious"  . Morphine And Related Nausea And Vomiting and Other (See Comments)    "makes me delirious"  . Penicillins Anaphylaxis    Tolerated Zosyn Dec 2014  . Tramadol Nausea And Vomiting  . Imitrex [Sumatriptan] Other (See Comments)    seizures  . Vicodin [Hydrocodone-Acetaminophen] Itching and Rash    Patient Measurements: Height: 5\' 1"  (154.9 cm) Weight: 145 lb 4.5 oz (65.9 kg) IBW/kg (Calculated) : 47.8  Vital Signs: Temp: 99.2 F (37.3 C) (11/11 0419) Temp Source: Oral (11/11 0419) BP: 83/43 mmHg (11/11 0419) Pulse Rate: 98 (11/11 0419)  Labs:  Recent Labs  04/05/15 1112 04/06/15 0430 04/07/15 0558 04/07/15 0743  HGB 8.8* 8.8* 9.2*  --   HCT 28.3* 29.5* 29.4*  --   PLT 164 180 125*  --   LABPROT  --  23.5* 24.7*  --   INR  --  2.11* 2.25*  --   CREATININE 10.31* 5.97*  --  7.87*    Estimated Creatinine Clearance: 8.1 mL/min (by C-G formula based on Cr of 7.87).  Assessment: 44 yo female with  ESRD on HD TTS on Coumadin pta for hx LLE DVT (12/2014). S/P Lymph node biopsy 11/7 by IR-Coumadin resumed 11/7 with Lovenox bridge.  Patient refused Lovenox yesterday. Team discontinued Lovenox as INR was therapeutic (note, patient does not have current active clot, therefore 5 day overlap is not required).  INR 2.25 yesterday, patient refused collection this morning. Has not missed a dose of Coumadin.   PTA Coumadin dose: 7.5mg  MWFSu and 5mg  TTSat  Goal of Therapy:  INR 2-3 Monitor platelets by anticoagulation protocol: Yes   Plan:  -Coumadin 5mg  today x1  -Daily INR- if tomorrow's value is <2, can consider restarting Lovenox -F/u CBC and s/s bleeding   Dany Harten D. Akanksha Bellmore, PharmD, BCPS Clinical Pharmacist Pager: [phone] 04/08/2015 8:43 AM

## 2015-04-08 NOTE — Progress Notes (Signed)
Patient's BP 83/43 this morning.  Asymptomatic.  Notified MD on-call.  New orders received.  Will continue to monitor.

## 2015-04-08 NOTE — Progress Notes (Signed)
ANTIBIOTIC CONSULT NOTE  Pharmacy Consult for Primaxin and vancomycin Indication: bacteremia  Allergies  Allergen Reactions  . Depakote [Divalproex Sodium] Other (See Comments)    Pt gets "delirious"  . Morphine And Related Nausea And Vomiting and Other (See Comments)    "makes me delirious"  . Penicillins Anaphylaxis    Tolerated Zosyn Dec 2014  . Tramadol Nausea And Vomiting  . Imitrex [Sumatriptan] Other (See Comments)    seizures  . Vicodin [Hydrocodone-Acetaminophen] Itching and Rash    Patient Measurements: Height: 5\' 1"  (154.9 cm) Weight: 145 lb 4.5 oz (65.9 kg) IBW/kg (Calculated) : 47.8   Vital Signs: Temp: 99.3 F (37.4 C) (11/11 0849) Temp Source: Oral (11/11 0849) BP: 114/51 mmHg (11/11 0849) Pulse Rate: 97 (11/11 0849) Intake/Output from previous day: 11/10 0701 - 11/11 0700 In: 480 [P.O.:480] Out: 2428  Intake/Output from this shift: Total I/O In: 240 [P.O.:240] Out: -   Labs:  Recent Labs  04/06/15 0430 04/07/15 0558 04/07/15 0743  WBC 6.8 7.5  --   HGB 8.8* 9.2*  --   PLT 180 125*  --   CREATININE 5.97*  --  7.87*   Estimated Creatinine Clearance: 8.1 mL/min (by C-G formula based on Cr of 7.87).  Recent Labs  04/05/15 1301 04/07/15 0747  VANCORANDOM 22 20     Microbiology: Recent Results (from the past 720 hour(s))  Culture, blood (routine x 2)     Status: None   Collection Time: 04/01/15  7:50 PM  Result Value Ref Range Status   Specimen Description BLOOD RIGHT ARM  Final   Special Requests IN PEDIATRIC BOTTLE 1CC  Final   Culture  Setup Time   Final    GRAM POSITIVE COCCI IN CLUSTERS AEROBIC BOTTLE ONLY CRITICAL RESULT CALLED TO, READ BACK BY AND VERIFIED WITH: A RIO 04/02/15 @ 72 M VESTAL    Culture STAPHYLOCOCCUS SPECIES (COAGULASE NEGATIVE)  Final   Report Status 04/04/2015 FINAL  Final   Organism ID, Bacteria STAPHYLOCOCCUS SPECIES (COAGULASE NEGATIVE)  Final      Susceptibility   Staphylococcus species (coagulase  negative) - MIC*    CIPROFLOXACIN >=8 RESISTANT Resistant     ERYTHROMYCIN >=8 RESISTANT Resistant     GENTAMICIN <=0.5 SENSITIVE Sensitive     OXACILLIN >=4 RESISTANT Resistant     TETRACYCLINE >=16 RESISTANT Resistant     VANCOMYCIN 1 SENSITIVE Sensitive     TRIMETH/SULFA 160 RESISTANT Resistant     CLINDAMYCIN <=0.25 SENSITIVE Sensitive     RIFAMPIN <=0.5 SENSITIVE Sensitive     Inducible Clindamycin NEGATIVE Sensitive     * STAPHYLOCOCCUS SPECIES (COAGULASE NEGATIVE)  Culture, blood (routine x 2)     Status: None   Collection Time: 04/01/15  7:56 PM  Result Value Ref Range Status   Specimen Description BLOOD LEFT HAND  Final   Special Requests IN PEDIATRIC BOTTLE 2CC  Final   Culture  Setup Time   Final    GRAM POSITIVE COCCI IN CLUSTERS AEROBIC BOTTLE ONLY CRITICAL RESULT CALLED TO, READ BACK BY AND VERIFIED WITH: A RIO 04/02/15 @ 1321 M VESTAL    Culture   Final    STAPHYLOCOCCUS SPECIES (COAGULASE NEGATIVE) SUSCEPTIBILITIES PERFORMED ON PREVIOUS CULTURE WITHIN THE LAST 5 DAYS.    Report Status 04/04/2015 FINAL  Final  Culture, blood (routine x 2)     Status: None (Preliminary result)   Collection Time: 04/03/15 11:45 AM  Result Value Ref Range Status   Specimen Description BLOOD RIGHT HAND  Final   Special Requests IN PEDIATRIC BOTTLE 4CC  Final   Culture  Setup Time   Final    GRAM POSITIVE COCCI IN CLUSTERS CRITICAL RESULT CALLED TO, READ BACK BY AND VERIFIED WITH: Louanne Skye 10:25 04/04/15 ([email or web address])    Culture   Final    STAPHYLOCOCCUS SPECIES (COAGULASE NEGATIVE) SUSCEPTIBILITIES PERFORMED ON PREVIOUS CULTURE WITHIN THE LAST 5 DAYS. PSEUDOMONAS FLUORESCENS CRITICAL RESULT CALLED TO, READ BACK BY AND VERIFIED WITH: DR Dareen Piano  04/07/15 @ 1113 M VESTAL    Report Status PENDING  Incomplete   Organism ID, Bacteria PSEUDOMONAS FLUORESCENS  Final      Susceptibility   Pseudomonas fluorescens - MIC*    CEFTAZIDIME 16 INTERMEDIATE Intermediate     CIPROFLOXACIN >=4  RESISTANT Resistant     GENTAMICIN <=1 SENSITIVE Sensitive     IMIPENEM 0.5 SENSITIVE Sensitive     * PSEUDOMONAS FLUORESCENS  Culture, blood (routine x 2)     Status: None (Preliminary result)   Collection Time: 04/03/15  7:35 PM  Result Value Ref Range Status   Specimen Description BLOOD RIGHT ANTECUBITAL  Final   Special Requests BOTTLES DRAWN AEROBIC ONLY 8CC  Final   Culture NO GROWTH 4 DAYS  Final   Report Status PENDING  Incomplete  Tissue culture     Status: None   Collection Time: 04/04/15  5:15 PM  Result Value Ref Range Status   Specimen Description TISSUE  Final   Special Requests HEAD NECK LYMPHADENOPATHY  Final   Gram Stain   Final    NO WBC SEEN NO ORGANISMS SEEN Performed at [facility]    Culture   Final    NO GROWTH 3 DAYS Performed at [facility]    Report Status 04/08/2015 FINAL  Final  Culture, blood (routine x 2)     Status: None   Collection Time: 04/05/15 12:22 AM  Result Value Ref Range Status   Specimen Description BLOOD RIGHT HAND  Final   Special Requests IN PEDIATRIC BOTTLE 3CC  Final   Culture  Setup Time   Final    GRAM POSITIVE COCCI IN CHAINS IN PAIRS AEROBIC BOTTLE ONLY CRITICAL RESULT CALLED TO, READ BACK BY AND VERIFIED WITHDwaine Gale RN [id number] 04/05/15 A BROWNING    Culture   Final    VIRIDANS STREPTOCOCCUS SUSCEPTIBILITIES TO FOLLOW STAPHYLOCOCCUS SPECIES (COAGULASE NEGATIVE) SUSCEPTIBILITIES PERFORMED ON PREVIOUS CULTURE WITHIN THE LAST 5 DAYS.    Report Status 04/08/2015 FINAL  Final   Organism ID, Bacteria VIRIDANS STREPTOCOCCUS  Final      Susceptibility   Viridans streptococcus - MIC*    ERYTHROMYCIN >=8 RESISTANT Resistant     TETRACYCLINE >=16 RESISTANT Resistant     VANCOMYCIN 0.5 SENSITIVE Sensitive     CLINDAMYCIN >=1 RESISTANT Resistant     * VIRIDANS STREPTOCOCCUS  Culture, blood (routine x 2)     Status: None (Preliminary result)   Collection Time: 04/05/15 12:37 AM  Result Value Ref Range Status    Specimen Description BLOOD RIGHT HAND  Final   Special Requests BOTTLES DRAWN AEROBIC AND ANAEROBIC 4CC  Final   Culture NO GROWTH 2 DAYS  Final   Report Status PENDING  Incomplete   Assessment: [name] who presented with lymphadenopathy. Now with persistent fevers & Bacteremia with unclear source. Vascular assessed right and left thigh grafts along with old nonfunctioning arm accesses- showed no evidence of infection.  Possible oral source, but no abscesses identified on panorex.  CXR  without overt opacity, but pt with cough and SOB. WBC 7.5, tmax/24h 102.5  Vanc 11/5 >> *loaded appropriately with 1500mg  *11/8 pre-HD VR = 22 *11/10 pre-HD VR = 20 *doses have all been appropriately given after full HD sessions  LVQ 11/9>>11/11 Primaxin 11/11>>  11/4 BCx: 2/2 CoNS, R cipro, oxacillin, tetracyclines, Bactrim 11/6 BCx: CoNS, same sensitivities; pseudumonas fluorescens- S to imi 11/7 lymph tissue: neg 11/8 BCx: 2/2 CoNS, same sensitivities; strep viridans- sens to vanc 11/10 BCx: sent  Goal of Therapy:  pre-HD vancomycin level 15-98mcg/mL  Plan:  -Vancomycin 750mg  IV qHD TTS- check pre HD vanc level when indicated. -Primaxin 250mg  IV q12h -f/u repeat bcx for clearance, clinical progression, ID plans, HD schedule changes  Markice Torbert D. Shari Natt, PharmD, BCPS Clinical Pharmacist Pager: [phone] 04/08/2015 12:27 PM

## 2015-04-08 NOTE — Progress Notes (Signed)
Subjective:  Hospital day 8  Reports she is still feeling "bad," but believes cough is improving and has been able to get up and walk around.  No problems with eating or appetite.  Specifically denies any N/V or abdominal pain.  Continues to have generalized pain that makes it difficult to sleep, as well as right-sided chest wall pain radiating into her neck.  Got 10mg  oxycodone last night for pain.  No additional complaints at this time.  States she understands that tests so far including abdominal ultrasound and panorex have not told us where the bacteria in her blood is coming from, and that identifying a source is important in helping her get well enough to leave the hospital.  Denies any prior injection drug use.  Objective: Vital signs  Filed Vitals:   04/07/15 1624 04/07/15 2029 04/08/15 0419 04/08/15 0849  BP: 112/50 100/54 83/43 114/51  Pulse: 96 87 98 97  Temp: 102.5 F (39.2 C) 99.8 F (37.7 C) 99.2 F (37.3 C) 99.3 F (37.4 C)  TempSrc: Oral Oral Oral Oral  Resp: 18 17 17 16   Height:      Weight:  65.9 kg (145 lb 4.5 oz)    SpO2: 98% 97% 99% 94%  11/10 @1624  was febrile to 102.53F 11/11 noted to be hypotensive to 83/43 at 0419AM but was without symptoms at that time per nursing note  Weight change:   Filed Weights   04/07/15 0715 04/07/15 1114 04/07/15 2029  Weight: 68.4 kg (150 lb 12.7 oz) 65.9 kg (145 lb 4.5 oz) 65.9 kg (145 lb 4.5 oz)    Intake/Output Summary (Last 24 hours) at 04/08/15 1351 Last data filed at 04/08/15 0850  Gross per 24 hour  Intake    720 ml  Output      0 ml  Net    720 ml   Physical exam Vitals Blood pressure 114/51, pulse 97, temperature 99.3 F (37.4 C), temperature source Oral, resp. 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  Sclera are muddy but anicteric.  Diffusely poor  dentition.   Neck No obvious JVD.  Swelling in the area of the supraclavicular nodes bilaterally.  No erythema/skin breakdown noted around nodes.  Cardiovascular Audible holosystolic murmur, best appreciated at the LLSB  Pulmonary Patient has normal work of breathing while at rest.  Crackles in bilateral lung bases and some scattered rhonchi in the upper lung fields.  No wheezing.  Improved as compared to 11/10.  Abdomen Soft, nontender  Skin Deferred    Neuro Alert and oriented to person and place   Lab Results: CMP Latest Ref Rng 04/07/2015 04/06/2015 04/05/2015  Glucose 65 - 99 mg/dL 122(H) 532(H) 254(H)  BUN 6 - 20 mg/dL 31(H) 23(H) 49(H)  Creatinine 0.44 - 1.00 mg/dL 7.87(H) 5.97(H) 10.31(H)  Sodium 135 - 145 mmol/L 126(L) 129(L) 128(L)  Potassium 3.5 - 5.1 mmol/L 3.5 4.5 5.1  Chloride 101 - 111 mmol/L 89(L) 90(L) 93(L)  CO2 22 - 32 mmol/L 25 25 19(L)  Calcium 8.9 - 10.3 mg/dL 8.1(L) 8.2(L) 8.2(L)  Total Protein 6.5 - 8.1 g/dL - - -  Total Bilirubin 0.3 - 1.2 mg/dL - - -  Alkaline Phos 38 - 126 U/L - - -  AST 15 - 41 U/L - - -  ALT 14 - 54 U/L - - -   CBC Latest Ref Rng 04/07/2015 04/06/2015 04/05/2015  WBC 4.0 - 10.5 K/uL 7.5 6.8 7.0  Hemoglobin 12.0 - 15.0 g/dL 9.2(L) 8.8(L) 8.8(L)  Hematocrit 36.0 - 46.0 % 29.4(L) 29.5(L) 28.3(L)  Platelets 150 - 400 K/uL 125(L) 180 164  No blood drawn for repeat CBC/CMP 11/11  Micro Results: Recent Results (from the past 240 hour(s))  Culture, blood (routine x 2)     Status: None   Collection Time: 04/01/15  7:50 PM  Result Value Ref Range Status   Specimen Description BLOOD RIGHT ARM  Final   Special Requests IN PEDIATRIC BOTTLE 1CC  Final   Culture  Setup Time   Final    GRAM POSITIVE COCCI IN CLUSTERS AEROBIC BOTTLE ONLY CRITICAL RESULT CALLED TO, READ BACK BY AND VERIFIED WITH: A RIO 04/02/15 @ 21 M VESTAL    Culture STAPHYLOCOCCUS SPECIES (COAGULASE NEGATIVE)  Final   Report Status 04/04/2015 FINAL  Final   Organism ID,  Bacteria STAPHYLOCOCCUS SPECIES (COAGULASE NEGATIVE)  Final      Susceptibility   Staphylococcus species (coagulase negative) - MIC*    CIPROFLOXACIN >=8 RESISTANT Resistant     ERYTHROMYCIN >=8 RESISTANT Resistant     GENTAMICIN <=0.5 SENSITIVE Sensitive     OXACILLIN >=4 RESISTANT Resistant     TETRACYCLINE >=16 RESISTANT Resistant     VANCOMYCIN 1 SENSITIVE Sensitive     TRIMETH/SULFA 160 RESISTANT Resistant     CLINDAMYCIN <=0.25 SENSITIVE Sensitive     RIFAMPIN <=0.5 SENSITIVE Sensitive     Inducible Clindamycin NEGATIVE Sensitive     * STAPHYLOCOCCUS SPECIES (COAGULASE NEGATIVE)  Culture, blood (routine x 2)     Status: None   Collection Time: 04/01/15  7:56 PM  Result Value Ref Range Status   Specimen Description BLOOD LEFT HAND  Final   Special Requests IN PEDIATRIC BOTTLE 2CC  Final   Culture  Setup Time   Final    GRAM POSITIVE COCCI IN CLUSTERS AEROBIC BOTTLE ONLY CRITICAL RESULT CALLED TO, READ BACK BY AND VERIFIED WITH: A RIO 04/02/15 @ 1321 M VESTAL    Culture   Final    STAPHYLOCOCCUS SPECIES (COAGULASE NEGATIVE) SUSCEPTIBILITIES PERFORMED ON PREVIOUS CULTURE WITHIN THE LAST 5 DAYS.    Report Status 04/04/2015 FINAL  Final  Culture, blood (routine x 2)     Status: None (Preliminary result)   Collection Time: 04/03/15 11:45 AM  Result Value Ref Range Status   Specimen Description BLOOD RIGHT HAND  Final   Special Requests IN PEDIATRIC BOTTLE 4CC  Final   Culture  Setup Time   Final    GRAM POSITIVE COCCI IN CLUSTERS CRITICAL RESULT CALLED TO, READ BACK BY AND VERIFIED WITH: Louanne Skye 10:25 04/04/15 ([email or web address])    Culture   Final    STAPHYLOCOCCUS SPECIES (COAGULASE NEGATIVE) SUSCEPTIBILITIES PERFORMED ON PREVIOUS CULTURE WITHIN THE LAST 5 DAYS. PSEUDOMONAS FLUORESCENS CRITICAL RESULT CALLED TO, READ BACK BY AND VERIFIED WITH: DR Dareen Piano  04/07/15 @ 1113 M VESTAL    Report Status PENDING  Incomplete   Organism ID, Bacteria PSEUDOMONAS FLUORESCENS  Final       Susceptibility   Pseudomonas fluorescens - MIC*    CEFTAZIDIME 16 INTERMEDIATE Intermediate     CIPROFLOXACIN >=4 RESISTANT Resistant     GENTAMICIN <=1 SENSITIVE Sensitive     IMIPENEM 0.5 SENSITIVE Sensitive     * PSEUDOMONAS FLUORESCENS  Culture, blood (routine x 2)  Status: None   Collection Time: 04/03/15  7:35 PM  Result Value Ref Range Status   Specimen Description BLOOD RIGHT ANTECUBITAL  Final   Special Requests BOTTLES DRAWN AEROBIC ONLY 8CC  Final   Culture NO GROWTH 5 DAYS  Final   Report Status 04/08/2015 FINAL  Final  Tissue culture     Status: None   Collection Time: 04/04/15  5:15 PM  Result Value Ref Range Status   Specimen Description TISSUE  Final   Special Requests HEAD NECK LYMPHADENOPATHY  Final   Gram Stain   Final    NO WBC SEEN NO ORGANISMS SEEN Performed at [facility]    Culture   Final    NO GROWTH 3 DAYS Performed at [facility]    Report Status 04/08/2015 FINAL  Final  Culture, blood (routine x 2)     Status: None   Collection Time: 04/05/15 12:22 AM  Result Value Ref Range Status   Specimen Description BLOOD RIGHT HAND  Final   Special Requests IN PEDIATRIC BOTTLE 3CC  Final   Culture  Setup Time   Final    GRAM POSITIVE COCCI IN CHAINS IN PAIRS AEROBIC BOTTLE ONLY CRITICAL RESULT CALLED TO, READ BACK BY AND VERIFIED WITHDwaine Gale RN [id number] 04/05/15 A BROWNING    Culture   Final    VIRIDANS STREPTOCOCCUS SUSCEPTIBILITIES TO FOLLOW STAPHYLOCOCCUS SPECIES (COAGULASE NEGATIVE) SUSCEPTIBILITIES PERFORMED ON PREVIOUS CULTURE WITHIN THE LAST 5 DAYS.    Report Status 04/08/2015 FINAL  Final   Organism ID, Bacteria VIRIDANS STREPTOCOCCUS  Final      Susceptibility   Viridans streptococcus - MIC*    ERYTHROMYCIN >=8 RESISTANT Resistant     TETRACYCLINE >=16 RESISTANT Resistant     VANCOMYCIN 0.5 SENSITIVE Sensitive     CLINDAMYCIN >=1 RESISTANT Resistant     * VIRIDANS STREPTOCOCCUS  Culture, blood (routine x 2)      Status: None (Preliminary result)   Collection Time: 04/05/15 12:37 AM  Result Value Ref Range Status   Specimen Description BLOOD RIGHT HAND  Final   Special Requests BOTTLES DRAWN AEROBIC AND ANAEROBIC 4CC  Final   Culture NO GROWTH 3 DAYS  Final   Report Status PENDING  Incomplete   Studies/Results: Dg Orthopantogram  04/07/2015  CLINICAL DATA:  44 year old with 1 week history of fever of unknown origin. Poor dentition. Evaluate for dental abscess. EXAM: ORTHOPANTOGRAM/PANORAMIC COMPARISON:  None. FINDINGS: No periapical lucencies involving the remaining teeth to suggest abscess. Temporomandibular joints intact. No intrinsic osseous abnormality involving the mandible or maxilla. IMPRESSION: No evidence of dental abscess. Electronically Signed   By: Evangeline Dakin M.D.   On: 04/07/2015 18:13   US Abdomen Complete  04/07/2015  CLINICAL DATA:  Ascites EXAM: ULTRASOUND ABDOMEN COMPLETE COMPARISON:  CT abdomen pelvis - 04/11/2015 FINDINGS: Gallbladder: Post cholecystectomy Common bile duct: Diameter: Normal in size measuring 3.9 mm in diameter Liver: Homogeneous hepatic echotexture. No discrete hepatic lesions. No definite evidence of intrahepatic biliary ductal dilatation. No ascites. IVC: No abnormality visualized. Pancreas: Limited visualization of the pancreatic head and neck is normal. Visualization of the pancreatic body and tail is obscured by bowel gas. Spleen: Normal in size measuring 11 cm in length Right Kidney: The right kidney is slightly atrophic measuring only 9.4 cm in length. There is diffuse renal cortical thinning with associated increased renal cortical echogenicity. There is an approximately 2.5 x 1.7 x 1.9 cm anechoic cyst arising mid aspect of the right kidney as  well as an adjacent approximately 1.2 x 1.2 x 1.4 cm anechoic cyst within the mid/inferior aspect of the right kidney. No evidence of right-sided urinary obstruction. Left Kidney: Obscured by colonic interposition and  patient body habitus. Abdominal aorta: No aneurysm visualized. Other findings: None. IMPRESSION: 1. Interval resolution of previously noted trace amount of perihepatic ascites. 2. The right kidney appears atrophic with diffuse increased renal cortical echogenicity, nonspecific though could be seen in the setting of medical renal disease - clinical correlation is advised. No evidence of right-sided urinary obstruction. 3. Nonvisualization of the left kidney secondary to colonic interposition and patient body habitus. Electronically Signed   By: Sandi Mariscal M.D.   On: 04/07/2015 19:06   TEE 11/8: Study Conclusions - Left ventricle: Systolic function was normal. The estimated ejection fraction was in the range of 60% to 65%. Wall motion was normal; there were no regional wall motion abnormalities. - Aortic valve: No evidence of vegetation. There was trivial regurgitation. - Mitral valve: No evidence of vegetation. There was mild regurgitation. - Left atrium: No evidence of thrombus in the atrial cavity or appendage. - Right atrium: No evidence of thrombus in the atrial cavity or appendage. - Atrial septum: No defect or patent foramen ovale was identified. There was an atrial septal aneurysm. - Tricuspid valve: There was moderate regurgitation.  Impressions: - No evidence of thrombus or vegetation.  11/11 vascular ultrasound Summary: -The right lower extremity arteriovenous loop graft is patent. There is an area of perigraft fluid surrounding the mid portion of the graft. In the proximal portion of the graft, there is a small area of outpouching of the proximal graft which exhibits internal flow. -The left lower extremity arteriovenous loop graft is patent. There is perigraft fliud surrounding the proximal portion of the graft.  Medications: I have reviewed the patient's current medications. Scheduled Meds: . calcium acetate  667 mg Oral BID WC  . clonazePAM  1 mg Oral  QHS  . darbepoetin (ARANESP) injection - DIALYSIS  200 mcg Intravenous Q Thu-HD  . dextromethorphan-guaiFENesin  1 tablet Oral BID  . dextrose  1 ampule Intravenous Once  . diclofenac sodium  2 g Topical QID  . doxercalciferol  1 mcg Intravenous Q T,Th,Sa-HD  . feeding supplement (NEPRO CARB STEADY)  237 mL Oral BID BM  . gabapentin  100 mg Oral Daily  . guaiFENesin  600 mg Oral BID  . imipenem-cilastatin  250 mg Intravenous Q12H  . insulin aspart  0-5 Units Subcutaneous TID WC  . insulin aspart  2 Units Subcutaneous TID WC  . insulin glargine  2 Units Subcutaneous Q2200  . insulin glargine  4 Units Subcutaneous q morning - 10a  . lidocaine  1 patch Transdermal Q24H  . metoCLOPramide  10 mg Oral TID AC & HS  . montelukast  10 mg Oral QHS  . multivitamin  1 tablet Oral Daily  . pantoprazole  40 mg Oral QHS  . simvastatin  20 mg Oral QHS  . vancomycin  750 mg Intravenous Q T,Th,Sa-HD  . warfarin  5 mg Oral ONCE-1800  . 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[place])   End-stage renal disease on hemodialysis (HCC)   Chronic  diastolic heart failure ([place])   History of DVT of lower extremity   Coag negative Staphylococcus bacteremia   Bacteremia  [name] is a 44 y/o female with PMH significant for Type 1 DM since childhood with ESRD s/p 2013 renal/pancreatic transplant with failure in 2014 now on T/Th/Sat HD and multiple previous admission for DKA (4 times in 2016, Mar/Jun/July/Aug), now on hospital day 8 for work-up of bilateral supraclavicular lymphadenopathy suspicious for lymphoma vs metastasis, being  kept for persistent bacteremia.  Of note, patient is very responsive to insulin and has been both hyperglycemic to >1000 on admission, and hypoglycemic to the 50s when restarted on home regimen and made NPO for procedures.    #Lymphadenopathy suspected 2/2 lymphoma vs mets: pt has bilateral supraclavicular node lymphadenopathy and CT showing mediastinal lymphadenopathy with spiculated mass in right pectoralis major, 11/7 needle biopsy had insufficient tissue for dx -General surgery evaluated patient, unable to palpate supraclavicular nodes so cannot perform excisional biopsy -GI does not want to perform EGD/biopsy while patient has persistent fevers and wide glucose fluctuation (would prefer patient to be consistently in 100-200 range); feel patient is not stable enough for anesthesia; continuing warfarin at this time   #Multi-organism bacteremia with unclear source: oxacillin-resistant, coag-negative staph seen on 11/6 culture, persistent on 11/8 culture despite treatment with vanc; 11/8 culture also grew strep viridans; patient also had late-growing 11/6 culture had pseudomonas fluorescens with resistance to ciprofloxacin on 11/6 plate.  Pseudomonas fluorescens has been known to cause bacteremia, however, it is not typically a pathogenic organism. Question if there could be some contaminant in the process of blood draw/culture, as a quick PubMed search reveals documented cases of "pseudobacteremia" outbreaks occuring due to contaminated collection tubes, and a different patient on the service was noted to have a pseudomonas fluorescens colony on urine culture today.  At this time, will treat [name] for pseudomonas fluorescens bacteremia given persistent fevers.  ID consult brought up possibility of self-injection being a source -Follow-up repeat blood cultures 11/11 -Consider tagged WBC scan on Monday, 11/14 if remaining bacteremic -Continue 750mg  vanc adminstered during T/Th/Sat HD -11/11: Stop  levaquin and start Primaxin (imipenem-cilastatin) 250mg  IV q12hrs per ID recommendations for coverage of pseudomonas fluorescens bacteremia -Vascular surgery saw pt, did not think HD access sites were source of infection; ultrasound 11/11 showed patent grafts bilaterally with some surrounding perigraft fluid -Panorex showed no dental abscess -Transfer pt to camera room to eval for possible self-injection behaviors  #T1DM: very difficult to control, was hyperglycemic >1000 without signs of acidosis on admission and subsequently hypoglycemic episodes in the 50s-60s; multiple adjustments have been made this admission, targeting maintaining blood glucose in 100-200 range -Continue on 4 units lantus in the AM, 2 units lantus in PM, 2 units insulin aspart TID with meals and SSI -Continue to monitor fingerstick and CBG  #SOB/cough: improving  -Continue Mucinex DM 30-600mg  BID, singulair 10mg  daily at bedtime -O2 [place] titrated so that O2 is >88%  -PE was also on the initial differential, Wells score is 3 (DVT earlier this year, tachycardia as of 2:30PM), or 4 if one counts malignancy criteria; would consider CT angiogram if patient continues to have worsening desaturations or O2 requirements given prior DVT hx and need to hold anticoagulation for several days earlier this admission  #Prior DVT: warfarin was held 11/4 --> 11/8 for biopsy -Warfarin restarted 11/8, now therapeutic -continue to follow PT/INR for dosing adjustment per pharmacy for HD  #Pain: -Tylenol 650mg  q6 PRN -  Continue on 5mg  oxycodone increased from q6hrs PRN  Other problems per prior progress notes.   Dispo: deferred at this time -Will need outpatient follow up with heme/onc and PCP  This is a Careers information officer Note.  The care of the patient was discussed with Dr. Berline Lopes and the assessment and plan formulated with their assistance.  Please see their attached note for official documentation of the daily encounter.   LOS: 8  days   Ladell Pier, Med Student 04/08/2015, 1:51 PM

## 2015-04-08 NOTE — Progress Notes (Signed)
Blood sugar 414, pt asymptomatic. Notified MD. No new orders, given SSI and meal time coverage. Will continue to monitor.

## 2015-04-08 NOTE — Progress Notes (Signed)
Late entry:  During the night, patient stated that her usual dose of 5 mg oxycodone was ineffective for her generalized pain.  She requested a different medication.  Notified MD on-call.  One time order placed for 10 mg oxycodone.  Medication administered.  Will continue to monitor.

## 2015-04-08 NOTE — Progress Notes (Addendum)
Subjective: Patient was seen and examined at bedside this morning. Tmax 102.5 in the past 24 hrs. Patient states her cough and chest congestion are better. Denies any chest pain/sob.n/v. Complaining of R sided neck pain where she has swollen LNs.   Objective: Vital signs in last 24 hours: Filed Vitals:   04/07/15 2029 04/08/15 0419 04/08/15 0849 04/08/15 1549  BP: 100/54 83/43 114/51 105/52  Pulse: 87 98 97 82  Temp: 99.8 F (37.7 C) 99.2 F (37.3 C) 99.3 F (37.4 C) 98.1 F (36.7 C)  TempSrc: Oral Oral Oral Oral  Resp: 17 17 16 17   Height:      Weight: 145 lb 4.5 oz (65.9 kg)     SpO2: 97% 99% 94% 100%   Weight change:   Intake/Output Summary (Last 24 hours) at 04/08/15 1620 Last data filed at 04/08/15 0850  Gross per 24 hour  Intake    720 ml  Output      0 ml  Net    720 ml   Physical Exam: Gen: AAOx3, NAD HEENT: bilateral enlarged supraclavicular LN, tender to palpation on right side CVS: RRR, S1 and S2 appreciated, systolic murmur   Lungs: bibasilar rales Abd: soft, NT, ND. +BS.  Ext: no edema Skin: warm and dry.   Lab Results: Basic Metabolic Panel:  Recent Labs Lab 04/05/15 1112 04/06/15 0430 04/07/15 0743  NA 128* 129* 126*  K 5.1 4.5 3.5  CL 93* 90* 89*  CO2 19* 25 25  GLUCOSE 254* 532* 122*  BUN 49* 23* 31*  CREATININE 10.31* 5.97* 7.87*  CALCIUM 8.2* 8.2* 8.1*  PHOS 4.8*  --  3.3   Liver Function Tests:  Recent Labs Lab 04/05/15 1112 04/07/15 0743  ALBUMIN 2.5* 2.3*   CBC:  Recent Labs Lab 04/02/15 0839 04/03/15 0221  04/06/15 0430 04/07/15 0558  WBC  --  9.5  < > 6.8 7.5  NEUTROABS 5.8 6.4  --   --   --   HGB  --  10.3*  < > 8.8* 9.2*  HCT  --  32.8*  < > 29.5* 29.4*  MCV  --  94.3  < > 94.9 94.8  PLT  --  203  < > 180 125*  < > = values in this interval not displayed. CBG:  Recent Labs Lab 04/08/15 0007 04/08/15 0203 04/08/15 0415 04/08/15 0600 04/08/15 0742 04/08/15 1233  GLUCAP 111* 154* 216* 215* 235* 414*    Coagulation:  Recent Labs Lab 04/04/15 0355 04/05/15 0022 04/06/15 0430 04/07/15 0558  LABPROT 19.1* 18.3* 23.5* 24.7*  INR 1.61* 1.51* 2.11* 2.25*    Micro Results: Recent Results (from the past 240 hour(s))  Culture, blood (routine x 2)     Status: None   Collection Time: 04/01/15  7:50 PM  Result Value Ref Range Status   Specimen Description BLOOD RIGHT ARM  Final   Special Requests IN PEDIATRIC BOTTLE 1CC  Final   Culture  Setup Time   Final    GRAM POSITIVE COCCI IN CLUSTERS AEROBIC BOTTLE ONLY CRITICAL RESULT CALLED TO, READ BACK BY AND VERIFIED WITH: A RIO 04/02/15 @ 39 M VESTAL    Culture STAPHYLOCOCCUS SPECIES (COAGULASE NEGATIVE)  Final   Report Status 04/04/2015 FINAL  Final   Organism ID, Bacteria STAPHYLOCOCCUS SPECIES (COAGULASE NEGATIVE)  Final      Susceptibility   Staphylococcus species (coagulase negative) - MIC*    CIPROFLOXACIN >=8 RESISTANT Resistant     ERYTHROMYCIN >=8 RESISTANT Resistant  GENTAMICIN <=0.5 SENSITIVE Sensitive     OXACILLIN >=4 RESISTANT Resistant     TETRACYCLINE >=16 RESISTANT Resistant     VANCOMYCIN 1 SENSITIVE Sensitive     TRIMETH/SULFA 160 RESISTANT Resistant     CLINDAMYCIN <=0.25 SENSITIVE Sensitive     RIFAMPIN <=0.5 SENSITIVE Sensitive     Inducible Clindamycin NEGATIVE Sensitive     * STAPHYLOCOCCUS SPECIES (COAGULASE NEGATIVE)  Culture, blood (routine x 2)     Status: None   Collection Time: 04/01/15  7:56 PM  Result Value Ref Range Status   Specimen Description BLOOD LEFT HAND  Final   Special Requests IN PEDIATRIC BOTTLE 2CC  Final   Culture  Setup Time   Final    GRAM POSITIVE COCCI IN CLUSTERS AEROBIC BOTTLE ONLY CRITICAL RESULT CALLED TO, READ BACK BY AND VERIFIED WITH: A RIO 04/02/15 @ 73 M VESTAL    Culture   Final    STAPHYLOCOCCUS SPECIES (COAGULASE NEGATIVE) SUSCEPTIBILITIES PERFORMED ON PREVIOUS CULTURE WITHIN THE LAST 5 DAYS.    Report Status 04/04/2015 FINAL  Final  Culture, blood (routine  x 2)     Status: None (Preliminary result)   Collection Time: 04/03/15 11:45 AM  Result Value Ref Range Status   Specimen Description BLOOD RIGHT HAND  Final   Special Requests IN PEDIATRIC BOTTLE 4CC  Final   Culture  Setup Time   Final    GRAM POSITIVE COCCI IN CLUSTERS CRITICAL RESULT CALLED TO, READ BACK BY AND VERIFIED WITH: Louanne Skye 10:25 04/04/15 ([email or web address])    Culture   Final    STAPHYLOCOCCUS SPECIES (COAGULASE NEGATIVE) SUSCEPTIBILITIES PERFORMED ON PREVIOUS CULTURE WITHIN THE LAST 5 DAYS. PSEUDOMONAS FLUORESCENS CRITICAL RESULT CALLED TO, READ BACK BY AND VERIFIED WITH: DR Dareen Piano  04/07/15 @ 1113 M VESTAL    Report Status PENDING  Incomplete   Organism ID, Bacteria PSEUDOMONAS FLUORESCENS  Final      Susceptibility   Pseudomonas fluorescens - MIC*    CEFTAZIDIME 16 INTERMEDIATE Intermediate     CIPROFLOXACIN >=4 RESISTANT Resistant     GENTAMICIN <=1 SENSITIVE Sensitive     IMIPENEM 0.5 SENSITIVE Sensitive     * PSEUDOMONAS FLUORESCENS  Culture, blood (routine x 2)     Status: None   Collection Time: 04/03/15  7:35 PM  Result Value Ref Range Status   Specimen Description BLOOD RIGHT ANTECUBITAL  Final   Special Requests BOTTLES DRAWN AEROBIC ONLY 8CC  Final   Culture NO GROWTH 5 DAYS  Final   Report Status 04/08/2015 FINAL  Final  Tissue culture     Status: None   Collection Time: 04/04/15  5:15 PM  Result Value Ref Range Status   Specimen Description TISSUE  Final   Special Requests HEAD NECK LYMPHADENOPATHY  Final   Gram Stain   Final    NO WBC SEEN NO ORGANISMS SEEN Performed at [facility]    Culture   Final    NO GROWTH 3 DAYS Performed at [facility]    Report Status 04/08/2015 FINAL  Final  Culture, blood (routine x 2)     Status: None   Collection Time: 04/05/15 12:22 AM  Result Value Ref Range Status   Specimen Description BLOOD RIGHT HAND  Final   Special Requests IN PEDIATRIC BOTTLE 3CC  Final   Culture  Setup Time   Final     GRAM POSITIVE COCCI IN CHAINS IN PAIRS AEROBIC BOTTLE ONLY CRITICAL RESULT CALLED TO, READ BACK BY AND  VERIFIED WITHDwaine Gale RN [id number] 04/05/15 A BROWNING    Culture   Final    VIRIDANS STREPTOCOCCUS SUSCEPTIBILITIES TO FOLLOW STAPHYLOCOCCUS SPECIES (COAGULASE NEGATIVE) SUSCEPTIBILITIES PERFORMED ON PREVIOUS CULTURE WITHIN THE LAST 5 DAYS.    Report Status 04/08/2015 FINAL  Final   Organism ID, Bacteria VIRIDANS STREPTOCOCCUS  Final      Susceptibility   Viridans streptococcus - MIC*    ERYTHROMYCIN >=8 RESISTANT Resistant     TETRACYCLINE >=16 RESISTANT Resistant     VANCOMYCIN 0.5 SENSITIVE Sensitive     CLINDAMYCIN >=1 RESISTANT Resistant     * VIRIDANS STREPTOCOCCUS  Culture, blood (routine x 2)     Status: None (Preliminary result)   Collection Time: 04/05/15 12:37 AM  Result Value Ref Range Status   Specimen Description BLOOD RIGHT HAND  Final   Special Requests BOTTLES DRAWN AEROBIC AND ANAEROBIC 4CC  Final   Culture NO GROWTH 3 DAYS  Final   Report Status PENDING  Incomplete   Studies/Results: Dg Orthopantogram  04/07/2015  CLINICAL DATA:  44 year old with 1 week history of fever of unknown origin. Poor dentition. Evaluate for dental abscess. EXAM: ORTHOPANTOGRAM/PANORAMIC COMPARISON:  None. FINDINGS: No periapical lucencies involving the remaining teeth to suggest abscess. Temporomandibular joints intact. No intrinsic osseous abnormality involving the mandible or maxilla. IMPRESSION: No evidence of dental abscess. Electronically Signed   By: Evangeline Dakin M.D.   On: 04/07/2015 18:13   US Abdomen Complete  04/07/2015  CLINICAL DATA:  Ascites EXAM: ULTRASOUND ABDOMEN COMPLETE COMPARISON:  CT abdomen pelvis - 04/11/2015 FINDINGS: Gallbladder: Post cholecystectomy Common bile duct: Diameter: Normal in size measuring 3.9 mm in diameter Liver: Homogeneous hepatic echotexture. No discrete hepatic lesions. No definite evidence of intrahepatic biliary ductal dilatation. No  ascites. IVC: No abnormality visualized. Pancreas: Limited visualization of the pancreatic head and neck is normal. Visualization of the pancreatic body and tail is obscured by bowel gas. Spleen: Normal in size measuring 11 cm in length Right Kidney: The right kidney is slightly atrophic measuring only 9.4 cm in length. There is diffuse renal cortical thinning with associated increased renal cortical echogenicity. There is an approximately 2.5 x 1.7 x 1.9 cm anechoic cyst arising mid aspect of the right kidney as well as an adjacent approximately 1.2 x 1.2 x 1.4 cm anechoic cyst within the mid/inferior aspect of the right kidney. No evidence of right-sided urinary obstruction. Left Kidney: Obscured by colonic interposition and patient body habitus. Abdominal aorta: No aneurysm visualized. Other findings: None. IMPRESSION: 1. Interval resolution of previously noted trace amount of perihepatic ascites. 2. The right kidney appears atrophic with diffuse increased renal cortical echogenicity, nonspecific though could be seen in the setting of medical renal disease - clinical correlation is advised. No evidence of right-sided urinary obstruction. 3. Nonvisualization of the left kidney secondary to colonic interposition and patient body habitus. Electronically Signed   By: Sandi Mariscal M.D.   On: 04/07/2015 19:06   Medications: I have reviewed the patient's current medications. Scheduled Meds: . calcium acetate  667 mg Oral BID WC  . clonazePAM  1 mg Oral QHS  . darbepoetin (ARANESP) injection - DIALYSIS  200 mcg Intravenous Q Thu-HD  . dextromethorphan-guaiFENesin  1 tablet Oral BID  . dextrose  1 ampule Intravenous Once  . diclofenac sodium  2 g Topical QID  . doxercalciferol  1 mcg Intravenous Q T,Th,Sa-HD  . feeding supplement (NEPRO CARB STEADY)  237 mL Oral BID BM  . gabapentin  100 mg  Oral Daily  . guaiFENesin  600 mg Oral BID  . imipenem-cilastatin  250 mg Intravenous Q12H  . insulin aspart  0-5 Units  Subcutaneous TID WC  . insulin aspart  2 Units Subcutaneous TID WC  . insulin glargine  2 Units Subcutaneous Q2200  . insulin glargine  4 Units Subcutaneous q morning - 10a  . lidocaine  1 patch Transdermal Q24H  . metoCLOPramide  10 mg Oral TID AC & HS  . montelukast  10 mg Oral QHS  . multivitamin  1 tablet Oral Daily  . pantoprazole  40 mg Oral QHS  . simvastatin  20 mg Oral QHS  . vancomycin  750 mg Intravenous Q T,Th,Sa-HD  . warfarin  5 mg Oral ONCE-1800  . 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 Also got CT abdomen which shows small ascites, some cirrhotic changes of liver, diffuse stomach wall thickening concerning for gastritis vs neoplastic involvement (gastric carcinoma vs lymphoma?). HIV and LDH negative. Needle LN biopsy was done by IR showing benign lymph node tissue. However, insufficient cells present for flow cytometry analysis. Surgery was consulted but informed me the neck LNs are not big enough in size for an excisional biopsy. Since the CT also showed diffuse stomach wall thickening. I spoke to Dr. Paulita Fujita [facility]) and he believes the patient is not currently stable to undergo tissue biopsy via EGD due to her fevers, high blood glucose values, and therapeutic INR.  -Oxycodone 5 mg q4 prn pain    -Voltaren gel prn  pain  Fever - Tmax 102.9 in the past 24 hrs. BCX (1/2 bottles) growing Strep viridans, coagulase negative staph, and pseudomonas fluorescence. Vancomycin started on 11/5. TEE did not show any vegetations making endocarditis less likely. ID has been consulted and is on board. They believe differential for fever includes infection vs tumor fever if patient does indeed have lymphoma/ mets. Skin was examined and no rashes noted. Vascular surgery was consulted and they do not think her dialysis access sites appear infected. Duplex showing a few small isolated areas of fluid around the grafts but no obvious signs of infection. [place] of abdomen showing trace perihepatic ascites. Panoramic dental x-ray did not show dental abscess or osseous abnormality. Levaquin was discontinued yesterday.  -cont vancomycin with HD -Started on Imipenem-Cilastatin for pseudomonas coverage  -tagged WBC scan on Monday (11/14) -Tylenol prn  Cough: Patient's cough has improved. Lung exam today remarkable for bibasilar rales, likely 2/2 atelectasis. Currently not complaining of any SOB. Satting 94-100% on RA. CXR on 11/7 consistent with atelectasis/ mild interstitial edema/ infection. BCX (1/2 bottles) growing Strep viridans, coagulase negative staph, and pseudomonas fluorescence.  -Antibiotics as above  -continue Mucinex-DM for congestion  -Albuterol nebulizer prn -Singulair 10 mg daily -continue Incentive spirometer -encourage ambulation (PT on board)  Severe hyperglycemia with DM Type I - Patient's blood glucose in the 200s this am.   -continue Lantus 4 u in then  morning and 2u in the evening -continue Novolog 2 u TID -Modified sensitive-SSI  Pseudohyponatremia - monitor with insulin regimen.  H/o LLE DVT (01/19/2015) -continue Coumadin per pharmacy   HFpEF: Stable. TEE showing LVEF >55%, mild MR, moderate TR, and trivial AR. No PR.  -Monitor   ESRD on HD (TTS): New left AVG in 12/2014. Went for HD yesterday.  -  appreciate nephro recs.   HTN: BP stable. Continue holding home Amlodipine 10 mg qd, Metoprolol 50 mg qd for now.   HLD: continue Zocor 20 mg qd  GERD: Protonix 40 mg qd   Insomnia: continue Ambien qhs    Code: Full  Dispo: Disposition is deferred at this time, awaiting improvement of current medical problems.  Anticipated discharge in approximately 2-3 day(s).   The patient does have a current PCP [name], MD) and does need an [facility] hospital follow-up appointment after discharge.  The patient does not have transportation limitations that hinder transportation to clinic appointments.  .Services Needed at time of discharge: Y = Yes, Blank = No PT:   OT:   RN:   Equipment:   Other:     LOS: 8 days   Shela Leff, MD 04/08/2015, 4:20 PM

## 2015-04-08 NOTE — Progress Notes (Signed)
Nursing staff paged me stating patient told them she had a mechanical fall. I saw and evaluated the patient. Patient states she fell on her buttocks while trying to put on her underwear. Patient denies hitting her head on the ground. Denies having any back pain or pain in any joint. Patient has chronic stable pain on the right side of her neck from swollen lymph nodes for which she is receiving pain medications. Denies any worsening of her neck pain after the fall. Denies having other new pain after the fall. Neuro exam - CN II-XII intact, Strength 5/5 and sensation grossly intact in bilateral upper and lower extremities.

## 2015-04-08 NOTE — Progress Notes (Signed)
Vascular Ultrasound Duplex Dialysis Access (AVF, AGV) has been completed.   04/08/2015 8:31 AM Maudry Mayhew, RVT, RDCS, RDMS

## 2015-04-08 NOTE — Progress Notes (Signed)
   04/08/15 1819  What Happened  Was fall witnessed? No  Was patient injured? No  Patient found other (Comment) (unwitnessed fall , pt found in )  Found by No one-pt stated they fell  Stated prior activity ambulating-unassisted  Follow Up  MD notified y  Time MD notified [place] notified No- patient refusal  Progress note created (see row info) Yes  Adult Fall Risk Assessment  Risk Factor Category (scoring not indicated) Not Applicable  Age 44  Fall History: Fall within 6 months prior to admission 0  Elimination; Bowel and/or Urine Incontinence 0  Elimination; Bowel and/or Urine Urgency/Frequency 0  Medications: includes PCA/Opiates, Anti-convulsants, Anti-hypertensives, Diuretics, Hypnotics, Laxatives, Sedatives, and Psychotropics 5  Patient Care Equipment 1  Mobility-Assistance 2  Mobility-Gait 2  Mobility-Sensory Deficit 0  Cognition-Awareness 0  Cognition-Impulsiveness 0  Cognition-Limitations 4  Total Score 14  Patient's Fall Risk High Fall Risk (>13 points)  Adult Fall Risk Interventions  Required Bundle Interventions *See Row Information* High fall risk - low, moderate, and high requirements implemented  Additional Interventions Fall risk signage;Individualized elimination schedule;PT/OT need assessed if change in mobility from baseline;Secure all tubes/drains;Use of appropriate toileting equipment (bedpan, BSC, etc.);Room near nurses station  Fall with Injury Screening  Risk For Fall Injury- See Row Information  F  Intervention(s) for 2 or more risk criteria identified Low Bed  Vitals  Temp 98.3 F (36.8 C)  Temp Source Oral  BP (!) 110/48 mmHg  BP Location Left Arm  BP Method Automatic  Patient Position (if appropriate) Sitting  Pulse Rate 87  Pulse Rate Source Dinamap  Resp 16  Oxygen Therapy  SpO2 99 %  O2 Device Room Air  Pain Assessment  Pain Assessment No/denies pain  PCA/Epidural/Spinal Assessment  Respiratory Pattern  Regular;Unlabored;Symmetrical;No dyspnea  Neurological  Neuro (WDL) WDL  Level of Consciousness Alert  Orientation Level Oriented X4  Cognition Appropriate at baseline  Speech Clear  Musculoskeletal  Musculoskeletal (WDL) X  Assistive Device None  Generalized Weakness Yes  Weight Bearing Restrictions No  Integumentary  Integumentary (WDL) X  Skin Color Appropriate for ethnicity  Skin Condition Dry  Skin Integrity Intact  Skin Turgor Non-tenting    EVENT: Unclear of time of fall, due to it being unwitnessed. Pt in room says she was finishing a bath, attempting to put her underwear on, lost balance and slipped back. Pt was in bed, eating dinner when reported fall. Pt stated, "I didn't call because I knew you would be back" (referring to this RN)  Penni Bombard, RN 04/08/2015

## 2015-04-09 LAB — CBC
HCT: 30.8 % — ABNORMAL LOW (ref 36.0–46.0)
Hemoglobin: 9.5 g/dL — ABNORMAL LOW (ref 12.0–15.0)
MCH: 29.9 pg (ref 26.0–34.0)
MCHC: 30.8 g/dL (ref 30.0–36.0)
MCV: 96.9 fL (ref 78.0–100.0)
Platelets: 150 10*3/uL (ref 150–400)
RBC: 3.18 MIL/uL — ABNORMAL LOW (ref 3.87–5.11)
RDW: 17.9 % — AB (ref 11.5–15.5)
WBC: 7.6 10*3/uL (ref 4.0–10.5)

## 2015-04-09 LAB — GLUCOSE, CAPILLARY
GLUCOSE-CAPILLARY: 88 mg/dL (ref 65–99)
GLUCOSE-CAPILLARY: 131 mg/dL — AB (ref 65–99)
GLUCOSE-CAPILLARY: 162 mg/dL — AB (ref 65–99)
GLUCOSE-CAPILLARY: 148 mg/dL — AB (ref 65–99)
Glucose-Capillary: 170 mg/dL — ABNORMAL HIGH (ref 65–99)
Glucose-Capillary: 329 mg/dL — ABNORMAL HIGH (ref 65–99)

## 2015-04-09 LAB — RENAL FUNCTION PANEL
Albumin: 2.3 g/dL — ABNORMAL LOW (ref 3.5–5.0)
Anion gap: 9 (ref 5–15)
BUN: 23 mg/dL — AB (ref 6–20)
CHLORIDE: 96 mmol/L — AB (ref 101–111)
CO2: 26 mmol/L (ref 22–32)
CREATININE: 7.48 mg/dL — AB (ref 0.44–1.00)
Calcium: 8 mg/dL — ABNORMAL LOW (ref 8.9–10.3)
GFR calc non Af Amer: 6 mL/min — ABNORMAL LOW (ref >60)
GFR, EST AFRICAN AMERICAN: 7 mL/min — AB (ref >60)
Glucose, Bld: 130 mg/dL — ABNORMAL HIGH (ref 65–99)
PHOSPHORUS: 2.6 mg/dL (ref 2.5–4.6)
POTASSIUM: 4 mmol/L (ref 3.5–5.1)
Sodium: 131 mmol/L — ABNORMAL LOW (ref 135–145)

## 2015-04-09 LAB — CULTURE, BLOOD (ROUTINE X 2)

## 2015-04-09 LAB — PROTIME-INR
INR: 1.88 — AB (ref 0.00–1.49)
PROTHROMBIN TIME: 21.5 s — AB (ref 11.6–15.2)

## 2015-04-09 MED ORDER — OXYCODONE HCL 5 MG PO TABS
10.0000 mg | ORAL_TABLET | ORAL | Status: DC | PRN
  Administered 2015-04-10 – 2015-04-12 (×6): 10 mg via ORAL
  Filled 2015-04-09 (×6): qty 2

## 2015-04-09 MED ORDER — HYDROMORPHONE HCL 1 MG/ML IJ SOLN
1.0000 mg | INTRAMUSCULAR | Status: DC | PRN
  Administered 2015-04-11: 1 mg via INTRAVENOUS

## 2015-04-09 MED ORDER — WARFARIN SODIUM 7.5 MG PO TABS
7.5000 mg | ORAL_TABLET | Freq: Once | ORAL | Status: AC
Start: 2015-04-09 — End: 2015-04-09
  Administered 2015-04-09: 7.5 mg via ORAL
  Filled 2015-04-09 (×2): qty 1

## 2015-04-09 MED ORDER — MORPHINE SULFATE (PF) 2 MG/ML IV SOLN: 2.0000 mg | INTRAVENOUS | Status: DC | PRN

## 2015-04-09 MED ORDER — DOXERCALCIFEROL 4 MCG/2ML IV SOLN
INTRAVENOUS | Status: AC
  Filled 2015-04-09: qty 2

## 2015-04-09 NOTE — Progress Notes (Signed)
PHARMACY NOTE  Consult :  Coumadin Indication :  History of LLE DVT  Dosing Wt :  69 kg  LABS :  Recent Labs  04/07/15 0558 04/07/15 0743 04/09/15 0720  HGB 9.2*  --  9.5*  HCT 29.4*  --  30.8*  PLT 125*  --  150  LABPROT 24.7*  --  21.5*  INR 2.25*  --  1.88*  CREATININE  --  7.87* 7.48*   MEDICATION: Medication PTA: Prescriptions prior to admission  Medication Sig Dispense Refill Last Dose  . albuterol (PROVENTIL HFA;VENTOLIN HFA) 108 (90 BASE) MCG/ACT inhaler Inhale 2 puffs into the lungs every 4 (four) hours as needed for wheezing or shortness of breath (as per home regimen). 1 Inhaler 0 03/30/2015 at Unknown time  . amLODipine (NORVASC) 10 MG tablet Take 10 mg by mouth at bedtime.   11 03/29/2015 at Unknown time  . aspirin 325 MG tablet Take 1 tablet (325 mg total) by mouth daily. 30 tablet 0 03/30/2015 at Unknown time  . calcium acetate (PHOSLO) 667 MG capsule Take 1 capsule (667 mg total) by mouth 2 (two) times daily. (Patient taking differently: Take 2,001 mg by mouth 3 (three) times daily with meals. ) 30 capsule 0 03/30/2015 at Unknown time  . clonazePAM (KLONOPIN) 0.5 MG tablet Take 1 mg by mouth at bedtime.    03/29/2015 at Unknown time  . Darbepoetin Alfa (ARANESP) 40 MCG/0.4ML SOSY injection Inject 0.4 mLs (40 mcg total) into the vein every Thursday with hemodialysis. 8.4 mL  Past Week at Unknown time  . doxercalciferol (HECTOROL) 4 MCG/2ML injection Inject 1 mL (2 mcg total) into the vein Every Tuesday,Thursday,and Saturday with dialysis. 2 mL  Past Week at Unknown time  . gabapentin (NEURONTIN) 100 MG capsule Take 100 mg by mouth daily.    03/30/2015 at Unknown time  . guaiFENesin (MUCINEX) 600 MG 12 hr tablet Take 1 tablet (600 mg total) by mouth 2 (two) times daily. 30 tablet 0 03/30/2015 at Unknown time  . insulin glargine (LANTUS) 100 UNIT/ML injection Inject 0.07 mLs (7 Units total) into the skin 2 (two) times daily. 10 mL 11 03/30/2015 at  Unknown time  . insulin lispro (HUMALOG KWIKPEN) 100 UNIT/ML KiwkPen Inject 6 Units into the skin 2 (two) times daily.    03/30/2015 at Unknown time  . lidocaine (LIDODERM) 5 % Place 1 patch onto the skin daily. Remove & Discard patch within 12 hours or as directed by MD 10 patch 0 03/30/2015 at Unknown time  . metoCLOPramide (REGLAN) 10 MG tablet Take 1 tablet (10 mg total) by mouth 4 (four) times daily -  before meals and at bedtime. 60 tablet 0 03/30/2015 at Unknown time  . metoprolol succinate (TOPROL-XL) 50 MG 24 hr tablet Take 1 tablet (50 mg total) by mouth daily. Take with or immediately following a meal. 30 tablet 1 03/29/2015 at 1800  . montelukast (SINGULAIR) 10 MG tablet Take 10 mg by mouth at bedtime.   03/29/2015 at Unknown time  . multivitamin (RENA-VIT) TABS tablet Take 1 tablet by mouth daily. 30 tablet 0 03/30/2015 at Unknown time  . Nutritional Supplements (FEEDING SUPPLEMENT, NEPRO CARB STEADY,) LIQD Take 237 mLs by mouth 2 (two) times daily between meals. 60 Can 0 03/30/2015 at Unknown time  . Oxycodone HCl 10 MG TABS Take 10 mg by mouth 3 (three) times daily as needed (for pain).   0 03/30/2015 at Unknown time  . oxyCODONE-acetaminophen (PERCOCET/ROXICET) 5-325 MG tablet Take 1-2  tablets by mouth every 6 (six) hours as needed for severe pain. 30 tablet 0 03/30/2015 at Unknown time  . pantoprazole (PROTONIX) 40 MG tablet Take 1 tablet (40 mg total) by mouth at bedtime. (Patient taking differently: Take 40 mg by mouth daily. Takes daily in the afternoon) 30 tablet 0 03/30/2015 at Unknown time  . promethazine (PHENERGAN) 25 MG tablet Take 1 tablet (25 mg total) by mouth every 6 (six) hours as needed for nausea or vomiting. 10 tablet 0 Past Month at Unknown time  . simvastatin (ZOCOR) 20 MG tablet Take 1 tablet (20 mg total) by mouth at bedtime. 30 tablet 0 03/29/2015 at Unknown time  . warfarin (COUMADIN) 5 MG tablet Take 0.5 tablets (2.5 mg total) by mouth daily at 6 PM. (Patient taking  differently: Take 5-7.5 mg by mouth See admin instructions. Take 1 tablet on Tuesday, Thursday and Saturday then take 1 and 1/2 tablets on Monday, Wednesday, Friday and Sunday) 60 tablet 0 03/30/2015 at Unknown time  . zolpidem (AMBIEN) 5 MG tablet Take 5 mg by mouth at bedtime as needed. sleep   03/29/2015 at Unknown time   Scheduled:  Scheduled:  . calcium acetate  667 mg Oral BID WC  . clonazePAM  1 mg Oral QHS  . darbepoetin (ARANESP) injection - DIALYSIS  200 mcg Intravenous Q Thu-HD  . dextromethorphan-guaiFENesin  1 tablet Oral BID  . dextrose  1 ampule Intravenous Once  . diclofenac sodium  2 g Topical QID  . doxercalciferol  1 mcg Intravenous Q T,Th,Sa-HD  . feeding supplement (NEPRO CARB STEADY)  237 mL Oral BID BM  . gabapentin  100 mg Oral Daily  . guaiFENesin  600 mg Oral BID  . imipenem-cilastatin  250 mg Intravenous Q12H  . insulin aspart  0-5 Units Subcutaneous TID WC  . insulin aspart  2 Units Subcutaneous TID WC  . insulin glargine  2 Units Subcutaneous Q2200  . insulin glargine  4 Units Subcutaneous q morning - 10a  . lidocaine  1 patch Transdermal Q24H  . metoCLOPramide  10 mg Oral TID AC & HS  . montelukast  10 mg Oral QHS  . multivitamin  1 tablet Oral Daily  . pantoprazole  40 mg Oral QHS  . simvastatin  20 mg Oral QHS  . vancomycin  750 mg Intravenous Q T,Th,Sa-HD  . Warfarin - Pharmacist Dosing Inpatient   Does not apply q1800   Infusion[s]: Infusions:   Antibiotic[s]: Anti-infectives    Start     Dose/Rate Route Frequency Ordered Stop   04/08/15 1300  imipenem-cilastatin (PRIMAXIN) 250 mg in sodium chloride 0.9 % 100 mL IVPB     250 mg 200 mL/hr over 30 Minutes Intravenous Every 12 hours 04/08/15 1228     04/08/15 1000  levofloxacin (LEVAQUIN) IVPB 500 mg  Status:  Discontinued     500 mg 100 mL/hr over 60 Minutes Intravenous Every 48 hours 04/06/15 0931 04/08/15 1217   04/07/15 1200  levofloxacin (LEVAQUIN) IVPB 500 mg  Status:  Discontinued     500  mg 100 mL/hr over 60 Minutes Intravenous Every T-Th-Sa (Hemodialysis) 04/06/15 0832 04/06/15 0931   04/06/15 0900  levofloxacin (LEVAQUIN) IVPB 750 mg     750 mg 100 mL/hr over 90 Minutes Intravenous STAT 04/06/15 0832 04/06/15 1203   04/05/15 1200  vancomycin (VANCOCIN) IVPB 750 mg/150 ml premix     75 0 mg 150 mL/hr over 60 Minutes Intravenous Every T-Th-Sa (Hemodialysis) 04/02/15 1412  04/02/15 1500  vancomycin (VANCOCIN) 1,500 mg in sodium chloride 0.9 % 500 mL IVPB     1,500 mg 250 mL/hr over 120 Minutes Intravenous  Once 04/02/15 1412 04/02/15 1747      ASSESSMENT :  44 y.o. female is currently on Coumadin for history of LLE DVT.     Today's INR down to 1.88.Marland Kitchen   INR  Sub-therapeutic.  Patient is currently NOT on Lovenox bridging.  No evidence of bleeding complications observed.  GOAL :  TARGET INR 2-3   PLAN : 1. Consider Lovenox bridging until INR > 2.. 2. Coumadin 7.5 mg today. 3. Daily INR's, CBC. Monitor for bleeding complications.   Follow Platelet counts.  Marthenia Rolling,  Pharm.D   04/09/2015,  11:25 AM

## 2015-04-09 NOTE — Progress Notes (Signed)
Patient ID: [name], female   DOB: 10-Sep-1970, 44 y.o.   MRN: [id number]  [facility] Progress Note   Assessment/ Plan:   1. R chest and neck pain - normal lymphatic tissue seen on FNA by IR and unable to get an excisional biopsy as palpable nodes felt too small. 2. MRSE bacteremia with persistent fevers, new GNR: No evidence of SBE on echocardiogram-on vancomycin and levofloxacin. Afebrile over the past 24 hours 3. ESRD TTS - ongoing HD at this time with some low blood pressures (mildly symptomatic). 4. BP/volume- off BP meds; lower UF at HD today because of hypotension 5. 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 DM per primary - BS labile - diet is very high in simple carbs- needs complex carbs and protein 9. Nutrition alb 2.5- renal carb mod/renavit- intake variable - nutrition consult  Subjective:   Continues to have right sided neck pain/sensation of swelling. Afebrile over the past 24hrs.   Objective:   BP 91/52 mmHg  Pulse 82  Temp(Src) 97.5 F (36.4 C) (Oral)  Resp 18  Ht 5\' 1"  (1.549 m)  Wt 69.3 kg (152 lb 12.5 oz)  BMI 28.88 kg/m2  SpO2 100%  LMP 03/02/2015  Physical Exam: Gen: Comfortably resting on HD CVS: Pulse regular in rate and rhythm Resp: Clear to auscultation bilaterally-occasional expiratory wheeze Abd: Soft, obese, nontender Ext: No lower extremity edema  Labs: BMET  Recent Labs Lab 04/03/15 0221 04/05/15 0022 04/05/15 1112 04/06/15 0430 04/07/15 0743 04/09/15 0720  NA 132* 128* 128* 129* 126* 131*  K 4.9 4.6 5.1 4.5 3.5 4.0  CL 94* 92* 93* 90* 89* 96*  CO2 24 23 19* 25 25 26   GLUCOSE 50* 238* 254* 532* 122* 130*  BUN 22* 42* 49* 23* 31* 23*   CREATININE 4.94* 9.32* 10.31* 5.97* 7.87* 7.48*  CALCIUM 8.8* 8.2* 8.2* 8.2* 8.1* 8.0*  PHOS 2.5  --  4.8*  --  3.3 2.6   CBC  Recent Labs Lab 04/03/15 0221  04/05/15 1112 04/06/15 0430 04/07/15 0558 04/09/15 0720  WBC 9.5  < > 7.0 6.8 7.5 7.6  NEUTROABS 6.4  --   --   --   --   --   HGB 10.3*  < > 8.8* 8.8* 9.2* 9.5*  HCT 32.8*  < > 28.3* 29.5* 29.4* 30.8*  MCV 94.3  < > 95.3 94.9 94.8 96.9  PLT 203  < > 164 180 125* 150  < > = values in this interval not displayed. Medications:    . calcium acetate  667 mg Oral BID WC  . clonazePAM  1 mg Oral QHS  . darbepoetin (ARANESP) injection - DIALYSIS  200 mcg Intravenous Q Thu-HD  . dextromethorphan-guaiFENesin  1 tablet Oral BID  . dextrose  1 ampule Intravenous Once  . diclofenac sodium  2 g Topical QID  . doxercalciferol  1 mcg Intravenous Q T,Th,Sa-HD  . feeding supplement (NEPRO CARB STEADY)  237 mL Oral BID BM  . gabapentin  100 mg Oral Daily  . guaiFENesin  600 mg Oral BID  . imipenem-cilastatin  250 mg Intravenous Q12H  . insulin aspart  0-5 Units Subcutaneous TID WC  . insulin aspart  2 Units Subcutaneous TID WC  .  insulin glargine  2 Units Subcutaneous Q2200  . insulin glargine  4 Units Subcutaneous q morning - 10a  . lidocaine  1 patch Transdermal Q24H  . metoCLOPramide  10 mg Oral TID AC & HS  . montelukast  10 mg Oral QHS  . multivitamin  1 tablet Oral Daily  . pantoprazole  40 mg Oral QHS  . simvastatin  20 mg Oral QHS  . vancomycin  750 mg Intravenous Q T,Th,Sa-HD  . Warfarin - Pharmacist Dosing Inpatient   Does not apply [id number]   Elmarie Shiley, MD 04/09/2015, 9:34 AM

## 2015-04-09 NOTE — Progress Notes (Signed)
Subjective: Patient was seen and examined at bedside this morning. Denies any chest pain/sob.n/v. Complaining of R sided neck pain where she has swollen LNs.   Objective: Vital signs in last 24 hours: Filed Vitals:   04/09/15 1030 04/09/15 1101 04/09/15 1110 04/09/15 1143  BP: 114/64 93/37 98/65  96/48  Pulse: 82 84 80 84  Temp:   97.9 F (36.6 C) 98.1 F (36.7 C)  TempSrc:    Oral  Resp:   180 16  Height:      Weight:   151 lb 0.2 oz (68.5 kg)   SpO2:   100% 94%   Weight change: 2 lb 6.5 oz (1.091 kg)  Intake/Output Summary (Last 24 hours) at 04/09/15 1245 Last data filed at 04/09/15 1143  Gross per 24 hour  Intake    120 ml  Output   1500 ml  Net  -1380 ml   Physical Exam: Gen: AAOx3, NAD HEENT: bilateral enlarged supraclavicular LN, tender to palpation on right side CVS: RRR, S1 and S2 appreciated, systolic murmur   Lungs: bibasilar rales Abd: soft, NT, ND. +BS.  Ext: no edema Skin: warm and dry.   Lab Results: Basic Metabolic Panel:  Recent Labs Lab 04/07/15 0743 04/09/15 0720  NA 126* 131*  K 3.5 4.0  CL 89* 96*  CO2 25 26  GLUCOSE 122* 130*  BUN 31* 23*  CREATININE 7.87* 7.48*  CALCIUM 8.1* 8.0*  PHOS 3.3 2.6   Liver Function Tests:  Recent Labs Lab 04/07/15 0743 04/09/15 0720  ALBUMIN 2.3* 2.3*   CBC:  Recent Labs Lab 04/03/15 0221  04/07/15 0558 04/09/15 0720  WBC 9.5  < > 7.5 7.6  NEUTROABS 6.4  --   --   --   HGB 10.3*  < > 9.2* 9.5*  HCT 32.8*  < > 29.4* 30.8*  MCV 94.3  < > 94.8 96.9  PLT 203  < > 125* 150  < > = values in this interval not displayed. CBG:  Recent Labs Lab 04/08/15 0742 04/08/15 1233 04/08/15 1638 04/09/15 0239 04/09/15 0639 04/09/15 1138  GLUCAP 235* 414* 177* 88 131* 162*   Coagulation:  Recent Labs Lab 04/05/15 0022 04/06/15 0430 04/07/15 0558 04/09/15 0720  LABPROT 18.3* 23.5* 24.7* 21.5*  INR 1.51* 2.11* 2.25* 1.88*    Micro Results: Recent Results (from the past 240 hour(s))    Culture, blood (routine x 2)     Status: None   Collection Time: 04/01/15  7:50 PM  Result Value Ref Range Status   Specimen Description BLOOD RIGHT ARM  Final   Special Requests IN PEDIATRIC BOTTLE 1CC  Final   Culture  Setup Time   Final    GRAM POSITIVE COCCI IN CLUSTERS AEROBIC BOTTLE ONLY CRITICAL RESULT CALLED TO, READ BACK BY AND VERIFIED WITH: A RIO 04/02/15 @ 1435 M VESTAL    Culture STAPHYLOCOCCUS SPECIES (COAGULASE NEGATIVE)  Final   Report Status 04/04/2015 FINAL  Final   Organism ID, Bacteria STAPHYLOCOCCUS SPECIES (COAGULASE NEGATIVE)  Final      Susceptibility   Staphylococcus species (coagulase negative) - MIC*    CIPROFLOXACIN >=8 RESISTANT Resistant     ERYTHROMYCIN >=8 RESISTANT Resistant     GENTAMICIN <=0.5 SENSITIVE Sensitive     OXACILLIN >=4 RESISTANT Resistant     TETRACYCLINE >=16 RESISTANT Resistant     VANCOMYCIN 1 SENSITIVE Sensitive     TRIMETH/SULFA 160 RESISTANT Resistant     CLINDAMYCIN <=0.25 SENSITIVE Sensitive  RIFAMPIN <=0.5 SENSITIVE Sensitive     Inducible Clindamycin NEGATIVE Sensitive     * STAPHYLOCOCCUS SPECIES (COAGULASE NEGATIVE)  Culture, blood (routine x 2)     Status: None   Collection Time: 04/01/15  7:56 PM  Result Value Ref Range Status   Specimen Description BLOOD LEFT HAND  Final   Special Requests IN PEDIATRIC BOTTLE 2CC  Final   Culture  Setup Time   Final    GRAM POSITIVE COCCI IN CLUSTERS AEROBIC BOTTLE ONLY CRITICAL RESULT CALLED TO, READ BACK BY AND VERIFIED WITH: A RIO 04/02/15 @ 87 M VESTAL    Culture   Final    STAPHYLOCOCCUS SPECIES (COAGULASE NEGATIVE) SUSCEPTIBILITIES PERFORMED ON PREVIOUS CULTURE WITHIN THE LAST 5 DAYS.    Report Status 04/04/2015 FINAL  Final  Culture, blood (routine x 2)     Status: None   Collection Time: 04/03/15 11:45 AM  Result Value Ref Range Status   Specimen Description BLOOD RIGHT HAND  Final   Special Requests IN PEDIATRIC BOTTLE 4CC  Final   Culture  Setup Time   Final     GRAM POSITIVE COCCI IN CLUSTERS CRITICAL RESULT CALLED TO, READ BACK BY AND VERIFIED WITH: Louanne Skye 10:25 04/04/15 ([email or web address])    Culture   Final    STAPHYLOCOCCUS SPECIES (COAGULASE NEGATIVE) SUSCEPTIBILITIES PERFORMED ON PREVIOUS CULTURE WITHIN THE LAST 5 DAYS. PSEUDOMONAS FLUORESCENS CRITICAL RESULT CALLED TO, READ BACK BY AND VERIFIED WITH: DR [facility]  04/07/15 @ 1113 M VESTAL    Report Status 04/09/2015 FINAL  Final   Organism ID, Bacteria PSEUDOMONAS FLUORESCENS  Final      Susceptibility   Pseudomonas fluorescens - MIC*    CEFTAZIDIME 16 INTERMEDIATE Intermediate     CIPROFLOXACIN >=4 RESISTANT Resistant     GENTAMICIN <=1 SENSITIVE Sensitive     IMIPENEM 0.5 SENSITIVE Sensitive     * PSEUDOMONAS FLUORESCENS  Culture, blood (routine x 2)     Status: None   Collection Time: 04/03/15  7:35 PM  Result Value Ref Range Status   Specimen Description BLOOD RIGHT ANTECUBITAL  Final   Special Requests BOTTLES DRAWN AEROBIC ONLY 8CC  Final   Culture NO GROWTH 5 DAYS  Final   Report Status 04/08/2015 FINAL  Final  Tissue culture     Status: None   Collection Time: 04/04/15  5:15 PM  Result Value Ref Range Status   Specimen Description TISSUE  Final   Special Requests HEAD NECK LYMPHADENOPATHY  Final   Gram Stain   Final    NO WBC SEEN NO ORGANISMS SEEN Performed at [facility]    Culture   Final    NO GROWTH 3 DAYS Performed at [facility]    Report Status 04/08/2015 FINAL  Final  Culture, blood (routine x 2)     Status: None   Collection Time: 04/05/15 12:22 AM  Result Value Ref Range Status   Specimen Description BLOOD RIGHT HAND  Final   Special Requests IN PEDIATRIC BOTTLE 3CC  Final   Culture  Setup Time   Final    GRAM POSITIVE COCCI IN CHAINS IN PAIRS AEROBIC BOTTLE ONLY CRITICAL RESULT CALLED TO, READ BACK BY AND VERIFIED WITHDwaine Gale RN [id number] 04/05/15 A BROWNING    Culture   Final    VIRIDANS STREPTOCOCCUS SUSCEPTIBILITIES TO  FOLLOW STAPHYLOCOCCUS SPECIES (COAGULASE NEGATIVE) SUSCEPTIBILITIES PERFORMED ON PREVIOUS CULTURE WITHIN THE LAST 5 DAYS.    Report Status 04/08/2015 FINAL  Final  Organism ID, Bacteria VIRIDANS STREPTOCOCCUS  Final      Susceptibility   Viridans streptococcus - MIC*    ERYTHROMYCIN >=8 RESISTANT Resistant     TETRACYCLINE >=16 RESISTANT Resistant     VANCOMYCIN 0.5 SENSITIVE Sensitive     CLINDAMYCIN >=1 RESISTANT Resistant     * VIRIDANS STREPTOCOCCUS  Culture, blood (routine x 2)     Status: None (Preliminary result)   Collection Time: 04/05/15 12:37 AM  Result Value Ref Range Status   Specimen Description BLOOD RIGHT HAND  Final   Special Requests BOTTLES DRAWN AEROBIC AND ANAEROBIC 4CC  Final   Culture NO GROWTH 4 DAYS  Final   Report Status PENDING  Incomplete   Studies/Results: Dg Orthopantogram  04/07/2015  CLINICAL DATA:  45 year old with 1 week history of fever of unknown origin. Poor dentition. Evaluate for dental abscess. EXAM: ORTHOPANTOGRAM/PANORAMIC COMPARISON:  None. FINDINGS: No periapical lucencies involving the remaining teeth to suggest abscess. Temporomandibular joints intact. No intrinsic osseous abnormality involving the mandible or maxilla. IMPRESSION: No evidence of dental abscess. Electronically Signed   By: Evangeline Dakin M.D.   On: 04/07/2015 18:13   US Abdomen Complete  04/07/2015  CLINICAL DATA:  Ascites EXAM: ULTRASOUND ABDOMEN COMPLETE COMPARISON:  CT abdomen pelvis - 04/11/2015 FINDINGS: Gallbladder: Post cholecystectomy Common bile duct: Diameter: Normal in size measuring 3.9 mm in diameter Liver: Homogeneous hepatic echotexture. No discrete hepatic lesions. No definite evidence of intrahepatic biliary ductal dilatation. No ascites. IVC: No abnormality visualized. Pancreas: Limited visualization of the pancreatic head and neck is normal. Visualization of the pancreatic body and tail is obscured by bowel gas. Spleen: Normal in size measuring 11 cm in  length Right Kidney: The right kidney is slightly atrophic measuring only 9.4 cm in length. There is diffuse renal cortical thinning with associated increased renal cortical echogenicity. There is an approximately 2.5 x 1.7 x 1.9 cm anechoic cyst arising mid aspect of the right kidney as well as an adjacent approximately 1.2 x 1.2 x 1.4 cm anechoic cyst within the mid/inferior aspect of the right kidney. No evidence of right-sided urinary obstruction. Left Kidney: Obscured by colonic interposition and patient body habitus. Abdominal aorta: No aneurysm visualized. Other findings: None. IMPRESSION: 1. Interval resolution of previously noted trace amount of perihepatic ascites. 2. The right kidney appears atrophic with diffuse increased renal cortical echogenicity, nonspecific though could be seen in the setting of medical renal disease - clinical correlation is advised. No evidence of right-sided urinary obstruction. 3. Nonvisualization of the left kidney secondary to colonic interposition and patient body habitus. Electronically Signed   By: Sandi Mariscal M.D.   On: 04/07/2015 19:06   Medications: I have reviewed the patient's current medications. Scheduled Meds: . calcium acetate  667 mg Oral BID WC  . clonazePAM  1 mg Oral QHS  . darbepoetin (ARANESP) injection - DIALYSIS  200 mcg Intravenous Q Thu-HD  . dextromethorphan-guaiFENesin  1 tablet Oral BID  . dextrose  1 ampule Intravenous Once  . diclofenac sodium  2 g Topical QID  . doxercalciferol  1 mcg Intravenous Q T,Th,Sa-HD  . feeding supplement (NEPRO CARB STEADY)  237 mL Oral BID BM  . gabapentin  100 mg Oral Daily  . guaiFENesin  600 mg Oral BID  . imipenem-cilastatin  250 mg Intravenous Q12H  . insulin aspart  0-5 Units Subcutaneous TID WC  . insulin aspart  2 Units Subcutaneous TID WC  . insulin glargine  2 Units Subcutaneous Q2200  .  insulin glargine  4 Units Subcutaneous q morning - 10a  . lidocaine  1 patch Transdermal Q24H  .  metoCLOPramide  10 mg Oral TID AC & HS  . montelukast  10 mg Oral QHS  . multivitamin  1 tablet Oral Daily  . pantoprazole  40 mg Oral QHS  . simvastatin  20 mg Oral QHS  . vancomycin  750 mg Intravenous Q T,Th,Sa-HD  . warfarin  7.5 mg Oral ONCE-1800  . 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 Also got CT abdomen which shows small ascites, some cirrhotic changes of liver, diffuse stomach wall thickening concerning for gastritis vs neoplastic involvement (gastric carcinoma vs lymphoma?). HIV and LDH negative. Needle LN biopsy was done by IR showing benign lymph node tissue. However, insufficient cells present for flow cytometry analysis. Surgery was consulted but informed me the neck LNs are not big enough in size for an excisional biopsy. Since the CT also showed diffuse stomach wall thickening. I spoke to Dr. Paulita Fujita [facility]) and he believes the patient is not currently stable to undergo tissue biopsy via EGD due to her fevers, high blood glucose values, and therapeutic INR.  -Oxycodone 5 mg q4 prn pain    -Voltaren gel prn pain  Fever - Patient has been afebrile for approximately 48 hrs now. [place] growing Strep viridans, coagulase negative staph, and pseudomonas fluorescence. Vancomycin started on 11/5. TEE did not show any vegetations  making endocarditis less likely. ID has been consulted and is on board. They believe differential for fever includes infection vs tumor fever if patient does indeed have lymphoma/ mets. Skin was examined and no rashes noted. Vascular surgery was consulted and they do not think her dialysis access sites appear infected. Duplex showing a few small isolated areas of fluid around the grafts but no obvious signs of infection. [place] of abdomen showing trace perihepatic ascites. Panoramic dental x-ray did not show dental abscess or osseous abnormality. -cont vancomycin with HD -cont Imipenem-Cilastatin for pseudomonas coverage  -tagged WBC scan on Monday (11/14) -Tylenol prn  Cough: Patient's cough has improved. Lung exam today remarkable for bibasilar rales, likely 2/2 atelectasis. Currently not complaining of any SOB. Satting 100% on RA. CXR on 11/7 consistent with atelectasis/ mild interstitial edema/ infection. BCX growing Strep viridans, coagulase negative staph, and pseudomonas fluorescence.  -Antibiotics as above  -continue Mucinex-DM for congestion  -Albuterol nebulizer prn -Singulair 10 mg daily -continue Incentive spirometer -encourage ambulation (PT on board)  Severe hyperglycemia with DM Type I - Patient's blood glucose in the 130s this am.   -continue Lantus 4 u in then morning and 2u in the evening -continue Novolog 2 u TID -Modified sensitive-SSI  Pseudohyponatremia - monitor with insulin regimen.  H/o LLE DVT (01/19/2015) -continue Coumadin per pharmacy   HFpEF: Stable. TEE showing LVEF >55%, mild MR, moderate TR, and trivial AR. No PR.  -Monitor   ESRD on HD (TTS): New left AVG  in 12/2014. HD today.  - appreciate nephro recs.   HTN: BP stable. Continue holding home Amlodipine 10 mg qd, Metoprolol 50 mg qd for now.   HLD: continue Zocor 20 mg qd  GERD: Protonix 40 mg qd   Insomnia: continue Ambien qhs    Code: Full  Dispo: Disposition is deferred at this time, awaiting  improvement of current medical problems.  Anticipated discharge in approximately 2-3 day(s).   The patient does have a current PCP [name], MD) and does need an [facility] hospital follow-up appointment after discharge.  The patient does not have transportation limitations that hinder transportation to clinic appointments.  .Services Needed at time of discharge: Y = Yes, Blank = No PT:   OT:   RN:   Equipment:   Other:     LOS: 9 days   Shela Leff, MD 04/09/2015, 12:45 PM

## 2015-04-09 NOTE — Procedures (Signed)
Patient seen on Hemodialysis. QB 400, UF goal 2L Treatment adjusted as needed.  Elmarie Shiley MD [facility]. Office # [phone] Pager # [phone] 9:41 AM

## 2015-04-10 DIAGNOSIS — R590 Localized enlarged lymph nodes: Secondary | ICD-10-CM

## 2015-04-10 LAB — CULTURE, BLOOD (ROUTINE X 2): CULTURE: NO GROWTH

## 2015-04-10 LAB — GLUCOSE, CAPILLARY
GLUCOSE-CAPILLARY: 67 mg/dL (ref 65–99)
GLUCOSE-CAPILLARY: 98 mg/dL (ref 65–99)
GLUCOSE-CAPILLARY: 106 mg/dL — AB (ref 65–99)
GLUCOSE-CAPILLARY: 97 mg/dL (ref 65–99)
GLUCOSE-CAPILLARY: 226 mg/dL — AB (ref 65–99)
Glucose-Capillary: 235 mg/dL — ABNORMAL HIGH (ref 65–99)
Glucose-Capillary: 156 mg/dL — ABNORMAL HIGH (ref 65–99)

## 2015-04-10 LAB — CBC WITH DIFFERENTIAL/PLATELET
BASOS ABS: 0 10*3/uL (ref 0.0–0.1)
BASOS PCT: 0 %
Eosinophils Absolute: 0.3 10*3/uL (ref 0.0–0.7)
Eosinophils Relative: 4 %
HEMATOCRIT: 32.4 % — AB (ref 36.0–46.0)
HEMOGLOBIN: 9.6 g/dL — AB (ref 12.0–15.0)
LYMPHS PCT: 26 %
Lymphs Abs: 2.1 10*3/uL (ref 0.7–4.0)
MCH: 28.8 pg (ref 26.0–34.0)
MCHC: 29.6 g/dL — ABNORMAL LOW (ref 30.0–36.0)
MCV: 97.3 fL (ref 78.0–100.0)
MONO ABS: 0.5 10*3/uL (ref 0.1–1.0)
Monocytes Relative: 6 %
NEUTROS ABS: 5 10*3/uL (ref 1.7–7.7)
NEUTROS PCT: 64 %
Platelets: 161 10*3/uL (ref 150–400)
RBC: 3.33 MIL/uL — AB (ref 3.87–5.11)
RDW: 18.6 % — AB (ref 11.5–15.5)
WBC: 7.9 10*3/uL (ref 4.0–10.5)

## 2015-04-10 LAB — PROTIME-INR
INR: 1.85 — ABNORMAL HIGH (ref 0.00–1.49)
PROTHROMBIN TIME: 21.2 s — AB (ref 11.6–15.2)

## 2015-04-10 LAB — RENAL FUNCTION PANEL
ALBUMIN: 2.4 g/dL — AB (ref 3.5–5.0)
ANION GAP: 8 (ref 5–15)
BUN: 12 mg/dL (ref 6–20)
CHLORIDE: 93 mmol/L — AB (ref 101–111)
CO2: 28 mmol/L (ref 22–32)
Calcium: 8.1 mg/dL — ABNORMAL LOW (ref 8.9–10.3)
Creatinine, Ser: 5.14 mg/dL — ABNORMAL HIGH (ref 0.44–1.00)
GFR, EST AFRICAN AMERICAN: 11 mL/min — AB (ref >60)
GFR, EST NON AFRICAN AMERICAN: 9 mL/min — AB (ref >60)
Glucose, Bld: 171 mg/dL — ABNORMAL HIGH (ref 65–99)
PHOSPHORUS: 2.1 mg/dL — AB (ref 2.5–4.6)
POTASSIUM: 4.2 mmol/L (ref 3.5–5.1)
Sodium: 129 mmol/L — ABNORMAL LOW (ref 135–145)

## 2015-04-10 LAB — HEPARIN LEVEL (UNFRACTIONATED): Heparin Unfractionated: 0.15 [IU]/mL — ABNORMAL LOW (ref 0.30–0.70)

## 2015-04-10 MED ORDER — HEPARIN (PORCINE) IN NACL 100-0.45 UNIT/ML-% IJ SOLN
1100.0000 [IU]/h | INTRAMUSCULAR | Status: DC
  Administered 2015-04-10: 900 [IU]/h via INTRAVENOUS
  Filled 2015-04-10: qty 250

## 2015-04-10 NOTE — Progress Notes (Signed)
Subjective:  Hospital day 10  Pt notes that she has been feeling better since IV antibiotics.  She was upset yesterday because she felt very confused about the plan to work-up her suspected cancer, but since having someone explain things to her, she feels she better understands and is not as upset.  She has some occasional SOB when she coughs, but denies SOB when up and walking around.   She has had pain at night that prevents her from getting a restful night of sleep, however, she was not aware she could ask for more pain medication (Oxycodone), as it is currently written q4hrs PRN, and tylenol 650mg  is written for every 6.  She says she will try asking if her pain is not controlled.  Reports good appetite.  She denies any N/V or abdominal pain.  Continues to have a bowel movement daily.  Objective: Vital signs  Filed Vitals:   04/09/15 1721 04/09/15 2050 04/10/15 0500 04/10/15 0835  BP: 96/44 103/46  107/56  Pulse: 85 88  87  Temp: 98.5 F (36.9 C) 98.4 F (36.9 C)  98.3 F (36.8 C)  TempSrc: Oral Oral  Oral  Resp: 16 18  18   Height:      Weight:  68.6 kg (151 lb 3.8 oz) 68.6 kg (151 lb 3.8 oz)   SpO2: 100% 96%  98%  Has been afebrile since 11/10 @1624 , was 102.67F at that time Low BPs in the 90s/40-50s overnight O2 sats >94% on room air overnight, currently on room air  Weight change: -0.191 kg (-6.7 oz)  Filed Weights   04/09/15 1110 04/09/15 2050 04/10/15 0500  Weight: 68.5 kg (151 lb 0.2 oz) 68.6 kg (151 lb 3.8 oz) 68.6 kg (151 lb 3.8 oz)    Intake/Output Summary (Last 24 hours) at 04/10/15 1042 Last data filed at 04/10/15 1014  Gross per 24 hour  Intake    960 ml  Output   1500 ml  Net   -540 ml   Physical exam Vitals Blood pressure 107/56, pulse 87, temperature 98.3 F (36.8 C), tesp. 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  Sclera are muddy but anicteric.  Diffusely poor dentition.    Neck No obvious JVD.  Swelling in the area of the supraclavicular nodes bilaterally.  No erythema/skin breakdown noted around nodes.  Cardiovascular Regular rate and rhythm, audible holosystolic murmur unchanged from prior examinations  Pulmonary Patient has normal work of breathing while at rest.  Scattered crackles in bilateral lower lung bases  Abdomen Soft, nontender, nondistended  Skin Multiple tattoos visible on arms and leg    Neuro Alert and oriented to person, place and time.  Affect is brighter than previously.   Lab Results: CMP Latest Ref Rng 04/09/2015 04/07/2015 04/06/2015  Glucose 65 - 99 mg/dL 130(H) 122(H) 532(H)  BUN 6 - 20 mg/dL 23(H) 31(H) 23(H)  Creatinine 0.44 - 1.00 mg/dL 7.48(H) 7.87(H) 5.97(H)  Sodium 135 - 145 mmol/L 131(L) 126(L) 129(L)  Potassium 3.5 - 5.1 mmol/L 4.0 3.5 4.5  Chloride 101 - 111 mmol/L 96(L) 89(L) 90(L)  CO2 22 - 32 mmol/L 26 25 25   Calcium 8.9 - 10.3 mg/dL 8.0(L) 8.1(L) 8.2(L)  Total Protein 6.5 -  8.1 g/dL - - -  Total Bilirubin 0.3 - 1.2 mg/dL - - -  Alkaline Phos 38 - 126 U/L - - -  AST 15 - 41 U/L - - -  ALT 14 - 54 U/L - - -   CBC Latest Ref Rng 04/09/2015 04/07/2015 04/06/2015  WBC 4.0 - 10.5 K/uL 7.6 7.5 6.8  Hemoglobin 12.0 - 15.0 g/dL 9.5(L) 9.2(L) 8.8(L)  Hematocrit 36.0 - 46.0 % 30.8(L) 29.4(L) 29.5(L)  Platelets 150 - 400 K/uL 150 125(L) 180    Micro Results: Recent Results (from the past 240 hour(s))  Culture, blood (routine x 2)     Status: None   Collection Time: 04/01/15  7:50 PM  Result Value Ref Range Status   Specimen Description BLOOD RIGHT ARM  Final   Special Requests IN PEDIATRIC BOTTLE 1CC  Final   Culture  Setup Time   Final    GRAM POSITIVE COCCI IN CLUSTERS AEROBIC BOTTLE ONLY CRITICAL RESULT CALLED TO, READ BACK BY AND VERIFIED WITH: A  RIO 04/02/15 @ 67 M VESTAL    Culture STAPHYLOCOCCUS SPECIES (COAGULASE NEGATIVE)  Final   Report Status 04/04/2015 FINAL  Final   Organism ID, Bacteria STAPHYLOCOCCUS SPECIES (COAGULASE NEGATIVE)  Final      Susceptibility   Staphylococcus species (coagulase negative) - MIC*    CIPROFLOXACIN >=8 RESISTANT Resistant     ERYTHROMYCIN >=8 RESISTANT Resistant     GENTAMICIN <=0.5 SENSITIVE Sensitive     OXACILLIN >=4 RESISTANT Resistant     TETRACYCLINE >=16 RESISTANT Resistant     VANCOMYCIN 1 SENSITIVE Sensitive     TRIMETH/SULFA 160 RESISTANT Resistant     CLINDAMYCIN <=0.25 SENSITIVE Sensitive     RIFAMPIN <=0.5 SENSITIVE Sensitive     Inducible Clindamycin NEGATIVE Sensitive     * STAPHYLOCOCCUS SPECIES (COAGULASE NEGATIVE)  Culture, blood (routine x 2)     Status: None   Collection Time: 04/01/15  7:56 PM  Result Value Ref Range Status   Specimen Description BLOOD LEFT HAND  Final   Special Requests IN PEDIATRIC BOTTLE 2CC  Final   Culture  Setup Time   Final    GRAM POSITIVE COCCI IN CLUSTERS AEROBIC BOTTLE ONLY CRITICAL RESULT CALLED TO, READ BACK BY AND VERIFIED WITH: A RIO 04/02/15 @ 1321 M VESTAL    Culture   Final    STAPHYLOCOCCUS SPECIES (COAGULASE NEGATIVE) SUSCEPTIBILITIES PERFORMED ON PREVIOUS CULTURE WITHIN THE LAST 5 DAYS.    Report Status 04/04/2015 FINAL  Final  Culture, blood (routine x 2)     Status: None   Collection Time: 04/03/15 11:45 AM  Result Value Ref Range Status   Specimen Description BLOOD RIGHT HAND  Final   Special Requests IN PEDIATRIC BOTTLE 4CC  Final   Culture  Setup Time   Final    GRAM POSITIVE COCCI IN CLUSTERS CRITICAL RESULT CALLED TO, READ BACK BY AND VERIFIED WITH: Louanne Skye 10:25 04/04/15 ([email or web address])    Culture   Final    STAPHYLOCOCCUS SPECIES (COAGULASE NEGATIVE) SUSCEPTIBILITIES PERFORMED ON PREVIOUS CULTURE WITHIN THE LAST 5 DAYS. PSEUDOMONAS FLUORESCENS CRITICAL RESULT CALLED TO, READ BACK BY AND VERIFIED WITH: DR [facility]   04/07/15 @ [place]    Report Status 04/09/2015 FINAL  Final   Organism ID, Bacteria PSEUDOMONAS FLUORESCENS  Final      Susceptibility   Pseudomonas fluorescens - MIC*    CEFTAZIDIME 16 INTERMEDIATE Intermediate     CIPROFLOXACIN >=4 RESISTANT Resistant     GENTAMICIN <=  1 SENSITIVE Sensitive     IMIPENEM 0.5 SENSITIVE Sensitive     * PSEUDOMONAS FLUORESCENS  Culture, blood (routine x 2)     Status: None   Collection Time: 04/03/15  7:35 PM  Result Value Ref Range Status   Specimen Description BLOOD RIGHT ANTECUBITAL  Final   Special Requests BOTTLES DRAWN AEROBIC ONLY 8CC  Final   Culture NO GROWTH 5 DAYS  Final   Report Status 04/08/2015 FINAL  Final  Tissue culture     Status: None   Collection Time: 04/04/15  5:15 PM  Result Value Ref Range Status   Specimen Description TISSUE  Final   Special Requests HEAD NECK LYMPHADENOPATHY  Final   Gram Stain   Final    NO WBC SEEN NO ORGANISMS SEEN Performed at [facility]    Culture   Final    NO GROWTH 3 DAYS Performed at [facility]    Report Status 04/08/2015 FINAL  Final  Culture, blood (routine x 2)     Status: None   Collection Time: 04/05/15 12:22 AM  Result Value Ref Range Status   Specimen Description BLOOD RIGHT HAND  Final   Special Requests IN PEDIATRIC BOTTLE 3CC  Final   Culture  Setup Time   Final    GRAM POSITIVE COCCI IN CHAINS IN PAIRS AEROBIC BOTTLE ONLY CRITICAL RESULT CALLED TO, READ BACK BY AND VERIFIED WITHDwaine Gale RN [id number] 04/05/15 A BROWNING    Culture   Final    VIRIDANS STREPTOCOCCUS STAPHYLOCOCCUS SPECIES (COAGULASE NEGATIVE) SUSCEPTIBILITIES PERFORMED ON PREVIOUS CULTURE WITHIN THE LAST 5 DAYS.    Report Status 04/09/2015 FINAL  Final   Organism ID, Bacteria VIRIDANS STREPTOCOCCUS  Final      Susceptibility   Viridans streptococcus - MIC*    ERYTHROMYCIN >=8 RESISTANT Resistant     TETRACYCLINE >=16 RESISTANT Resistant     VANCOMYCIN 0.5 SENSITIVE Sensitive      CLINDAMYCIN >=1 RESISTANT Resistant     * VIRIDANS STREPTOCOCCUS  Culture, blood (routine x 2)     Status: None (Preliminary result)   Collection Time: 04/05/15 12:37 AM  Result Value Ref Range Status   Specimen Description BLOOD RIGHT HAND  Final   Special Requests BOTTLES DRAWN AEROBIC AND ANAEROBIC 4CC  Final   Culture NO GROWTH 4 DAYS  Final   Report Status PENDING  Incomplete   Studies/Results: TEE 11/8: Study Conclusions - Left ventricle: Systolic function was normal. The estimated ejection fraction was in the range of 60% to 65%. Wall motion was normal; there were no regional wall motion abnormalities. - Aortic valve: No evidence of vegetation. There was trivial regurgitation. - Mitral valve: No evidence of vegetation. There was mild regurgitation. - Left atrium: No evidence of thrombus in the atrial cavity or appendage. - Right atrium: No evidence of thrombus in the atrial cavity or appendage. - Atrial septum: No defect or patent foramen ovale was identified. There was an atrial septal aneurysm. - Tricuspid valve: There was moderate regurgitation. Impressions: - No evidence of thrombus or vegetation.  11/11 vascular ultrasound Summary: -The right lower extremity arteriovenous loop graft is patent. There is an area of perigraft fluid surrounding the mid portion of the graft. In the proximal portion of the graft, there is a small area of outpouching of the proximal graft which exhibits internal flow. -The left lower extremity arteriovenous loop graft is patent. There is perigraft fliud surrounding the proximal portion of the graft.  Medications:  I have reviewed the patient's current medications. Scheduled Meds: . calcium acetate  667 mg Oral BID WC  . clonazePAM  1 mg Oral QHS  . darbepoetin (ARANESP) injection - DIALYSIS  200 mcg Intravenous Q Thu-HD  . dextromethorphan-guaiFENesin  1 tablet Oral BID  . dextrose  1 ampule Intravenous Once  .  diclofenac sodium  2 g Topical QID  . doxercalciferol  1 mcg Intravenous Q T,Th,Sa-HD  . feeding supplement (NEPRO CARB STEADY)  237 mL Oral BID BM  . gabapentin  100 mg Oral Daily  . guaiFENesin  600 mg Oral BID  . imipenem-cilastatin  250 mg Intravenous Q12H  . insulin aspart  0-5 Units Subcutaneous TID WC  . insulin aspart  2 Units Subcutaneous TID WC  . insulin glargine  2 Units Subcutaneous Q2200  . insulin glargine  4 Units Subcutaneous q morning - 10a  . lidocaine  1 patch Transdermal Q24H  . metoCLOPramide  10 mg Oral TID AC & HS  . montelukast  10 mg Oral QHS  . multivitamin  1 tablet Oral Daily  . pantoprazole  40 mg Oral QHS  . simvastatin  20 mg Oral QHS  . vancomycin  750 mg Intravenous Q T,Th,Sa-HD  . 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  Cancer work-up currently on hold until patient more stabilized.  Blood sugars have been very difficult to control, as pt is very brittle diabetic and very  sensitive to insulin.    #Lymphadenopathy suspected 2/2 lymphoma vs mets: pt has bilateral supraclavicular node lymphadenopathy and CT showing mediastinal lymphadenopathy with spiculated mass in right pectoralis major, 11/7 needle biopsy had insufficient tissue for dx & nodes are not large enough for excisional biopsy -CT surgery consulted for biopsy of right pectoralis major mass, will see patient  -Anticoagulation with heparin, hold warfarin in anticipation of biopsy Mon or Tues  #Multi-organism bacteremia with unclear source: oxacillin-resistant, coag-negative staph seen on 11/6 culture, persistent on 11/8 culture despite treatment with vanc; 11/8 culture also grew strep viridans; patient also had late-growing 11/6 culture had pseudomonas fluorescens with resistance to ciprofloxacin on 11/6 plate.  Pseudomonas fluorescens could be contaminant but has been known to cause bacteremia, so treating as bacteremia at this time -11/11: Stop levaquin and start Primaxin (imipenem-cilastatin) 250mg  IV q12hrs per ID recommendations for coverage of pseudomonas fluorescens bacteremia -Continue 750mg  vanc adminstered during T/Th/Sat HD -Follow-up repeat blood cultures drawn 11/12 -Consider tagged WBC scan on Monday, 11/14 if remaining bacteremic -Vascular did not think HD access sites were source of infection; ultrasound 11/11 showed patent grafts bilaterally with some surrounding perigraft fluid -Panorex showed no dental  abscess -Pt transferred to camera room 11/11 to eval for possible self-injection behaviors   #T1DM: very brittle/responsive to insulin, adjustments per diabetes coordinator recommendations; ideally would have sugars maintained within the 100-150 range  -Continue on 4 units lantus in the AM, 2 units lantus in PM, 2 units insulin aspart TID with meals and SSI -Continue to monitor fingerstick and CBG  #SOB/cough: improving  -Continue Mucinex DM 30-600mg  BID, singulair 10mg  daily at  bedtime -O2 Delta titrated so that O2 is >88% as needed for SOB/hypoxia  #Prior DVT: warfarin was held 11/4 --> 11/8 for biopsy -Warfarin restarted 11/8, currently therapeutic -Will hold warfarin and anticoag with heparin 11/13 in anticipation of biopsy on Mon/Tues  #Pain: -Tylenol 650mg  q6 PRN -Continue on 5mg  oxycodone increased to q4hrs PRN  Other problems per prior progress notes.   Dispo: deferred at this time pending resolution of current hospital problems -Will need outpatient follow up with heme/onc and PCP  This is a Careers information officer Note.  The care of the patient was discussed with Dr. Berline Lopes and the assessment and plan formulated with their assistance.  Please see their attached note for official documentation of the daily encounter.   LOS: 10 days   Ladell Pier, Med Student 04/10/2015, 10:42 AM

## 2015-04-10 NOTE — Consult Note (Signed)
Reason for Consult:Adenopathy Referring Physician: TH  SHOSHANNA MCQUITTY is an 44 y.o. female.  HPI:  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 [name] presented with a cc/o neck swelling for 2 weeks PTA. [name] first noted left supraclavicular swelling. It decreased slightly in size, but 2-3 days PTA [name] noted right supraclavicular swelling. The right side was painful and tender to palpation. [name] denied fever, chills, night sweats, weight loss, abd pain, n/v, diarrhea. [name] does complain of decreased appetite and PO intake. [name] has had fever since admission.  In ED CT showed mild pharyngitis, lymphadenopathy throughout the neck and mediastinal lymphadenopathy,and a spiculated mass like area in R pecotralis major muscle with ill defined margins. A needle biopsy was taken of the right supraclavicular node but was non-diagnostic.  Past Medical History  Diagnosis Date  . Hyperlipidemia   . Alopecia   . Obesity   . Iron deficiency   . RLS (restless legs syndrome)   . Injury of conjunctiva and corneal abrasion of right eye without foreign body   . Cellulitis   . Anemia   . Blood transfusion 2004    "when I had my transplant"  . Migraine   . Hyperparathyroidism, secondary ([place])   . Diabetic retinopathy   . Hypertension     takes Metoprolol and Exforge daily  . Depression     takes Klonopin nightly  . Diabetes mellitus type 1 (HCC)     Pt states diagnosed at age 81 with prior episodes of DKA. S/p pancreatic transplant   . GERD (gastroesophageal reflux disease)     takes Protonix daily  . Bronchitis   . Migraine   . Asthma   . Emphysema of lung ([place])   . Angina     none in past 2 years.  . Stomach pain     Hx: of chronic  . Pneumonia 2012    "double"  . Dialysis patient ([place])     tues,thurs,sat  . 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  Surgery was done at [facility].     . DKA (diabetic ketoacidoses) ([place]) 12/01/2014  . Diastolic CHF, acute on chronic ([place]) 01/02/2015  . Stroke [facility]) 03/2014    ? TIA  . ESRD (end stage renal disease) [facility])     S/p pancreatic and kidney transplant, but back on HD 2008, dialysis at [place], Sat    Past Surgical History  Procedure Laterality Date  . 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 Thyroglobulin      x 7  . Av fistula placement      right upper arm  . Cholecystectomy  1995  .  hd graft placement/removal      "had 2 in my left upper arm"  . Eye surgery    . Retinopathy surgery    . Tooth extraction  10/10/11    X's two  . Insertion of dialysis catheter  12/08/2011    Procedure: INSERTION OF DIALYSIS CATHETER;  Surgeon: Angelia Mould, MD;  Location: [place];  Service: Vascular;  Laterality: Left;  . Av fistula placement  01/22/2012  Procedure: INSERTION OF ARTERIOVENOUS (AV) GORE-TEX GRAFT ARM;  Surgeon: Angelia Mould, MD;  Location: [place];  Service: Vascular;  Laterality: Left;  Ultrasound guided.  . Av fistula placement  03/14/2012    Procedure: INSERTION OF ARTERIOVENOUS (AV) GORE-TEX GRAFT THIGH;  Surgeon: Angelia Mould, MD;  Location: [place];  Service: Vascular;  Laterality: Right;  . False aneurysm repair Right 01/02/2013    Procedure: Excision of lymphocele in right thigh.;  Surgeon: Angelia Mould, MD;  Location: [place];  Service: Vascular;  Laterality: Right;  . Carpal tunnel release Right 03/02/2013    Procedure: CARPAL TUNNEL RELEASE;  Surgeon: Tennis Must, MD;  Location: [place];  Service: Orthopedics;  Laterality: Right;  . Flexible sigmoidoscopy N/A 03/24/2013    Procedure: FLEXIBLE SIGMOIDOSCOPY;  Surgeon:  Cleotis Nipper, MD;  Location: [facility] ENDOSCOPY;  Service: Endoscopy;  Laterality: N/A;  . Revision of arteriovenous goretex graft Right 04/02/2013    Procedure: REPAIR OF PSEUDOANEURYSM OF ARTERIOVENOUS GORETEX GRAFT;  Surgeon: Angelia Mould, MD;  Location: [place];  Service: Vascular;  Laterality: Right;  . Carpal tunnel release Left 10/16/2013    Procedure: LEFT CARPAL TUNNEL RELEASE;  Surgeon: Tennis Must, MD;  Location: [place];  Service: Orthopedics;  Laterality: Left;  . Unilateral upper extremeity angiogram N/A 11/12/2011    Procedure: UNILATERAL UPPER Anselmo Rod;  Surgeon: Angelia Mould, MD;  Location: [facility] CATH LAB;  Service: Cardiovascular;  Laterality: N/A;  . Fistulogram Left 03/03/2012    Procedure: FISTULOGRAM;  Surgeon: Angelia Mould, MD;  Location: [facility] CATH LAB;  Service: Cardiovascular;  Laterality: Left;  . Av fistula placement Left 01/14/2015    Procedure: INSERTION OF LEFT ARTERIOVENOUS (AV) GORE-TEX GRAFT THIGH;  Surgeon: Angelia Mould, MD;  Location: [place];  Service: Vascular;  Laterality: Left;  . Tee without cardioversion N/A 04/05/2015    Procedure: TRANSESOPHAGEAL ECHOCARDIOGRAM (TEE);  Surgeon: Skeet Latch, MD;  Location: [facility] ENDOSCOPY;  Service: Cardiovascular;  Laterality: N/A;    Family History  Problem Relation Age of Onset  . Hypertension Mother   . Kidney disease Mother   . Diabetes Mother   . Hypertension Father   . Kidney disease Father   . Diabetes Father     Social History:  reports that [name] has never smoked. [name] has never used smokeless tobacco. [name] reports that [name] does not drink alcohol or use illicit drugs.  Allergies:  Allergies  Allergen Reactions  . Depakote [Divalproex Sodium] Other (See Comments)    Pt gets "delirious"  . Morphine And Related Nausea And Vomiting and Other (See Comments)    "makes me delirious"  . Penicillins Anaphylaxis    Tolerated Zosyn Dec 2014  . Tramadol Nausea  And Vomiting  . Imitrex [Sumatriptan] Other (See Comments)    seizures  . Vicodin [Hydrocodone-Acetaminophen] Itching and Rash    Medications:  Scheduled: . calcium acetate  667 mg Oral BID WC  . clonazePAM  1 mg Oral QHS  . darbepoetin (ARANESP) injection - DIALYSIS  200 mcg Intravenous Q Thu-HD  . dextromethorphan-guaiFENesin  1 tablet Oral BID  . dextrose  1 ampule Intravenous Once  . diclofenac sodium  2 g Topical QID  . doxercalciferol  1 mcg Intravenous Q T,Th,Sa-HD  . feeding supplement (NEPRO CARB STEADY)  237 mL Oral BID BM  . gabapentin  100 mg Oral Daily  . guaiFENesin  600 mg Oral BID  . imipenem-cilastatin  250  mg Intravenous Q12H  . insulin aspart  0-5 Units Subcutaneous TID WC  . insulin aspart  2 Units Subcutaneous TID WC  . insulin glargine  2 Units Subcutaneous Q2200  . insulin glargine  4 Units Subcutaneous q morning - 10a  . lidocaine  1 patch Transdermal Q24H  . metoCLOPramide  10 mg Oral TID AC & HS  . montelukast  10 mg Oral QHS  . multivitamin  1 tablet Oral Daily  . pantoprazole  40 mg Oral QHS  . simvastatin  20 mg Oral QHS  . vancomycin  750 mg Intravenous Q T,Th,Sa-HD    Results for orders placed or performed during the hospital encounter of 03/30/15 (from the past 48 hour(s))  Glucose, capillary     Status: None   Collection Time: 04/09/15  2:39 AM  Result Value Ref Range   Glucose-Capillary 88 65 - 99 mg/dL  Glucose, capillary     Status: Abnormal   Collection Time: 04/09/15  6:39 AM  Result Value Ref Range   Glucose-Capillary 131 (H) 65 - 99 mg/dL  CBC     Status: Abnormal   Collection Time: 04/09/15  7:20 AM  Result Value Ref Range   WBC 7.6 4.0 - 10.5 K/uL   RBC 3.18 (L) 3.87 - 5.11 MIL/uL   Hemoglobin 9.5 (L) 12.0 - 15.0 g/dL   HCT 30.8 (L) 36.0 - 46.0 %   MCV 96.9 78.0 - 100.0 fL   MCH 29.9 26.0 - 34.0 pg   MCHC 30.8 30.0 - 36.0 g/dL   RDW 17.9 (H) 11.5 - 15.5 %   Platelets 150 150 - 400 K/uL  Protime-INR     Status: Abnormal    Collection Time: 04/09/15  7:20 AM  Result Value Ref Range   Prothrombin Time 21.5 (H) 11.6 - 15.2 seconds   INR 1.88 (H) 0.00 - 1.49  Renal function panel     Status: Abnormal   Collection Time: 04/09/15  7:20 AM  Result Value Ref Range   Sodium 131 (L) 135 - 145 mmol/L   Potassium 4.0 3.5 - 5.1 mmol/L   Chloride 96 (L) 101 - 111 mmol/L   CO2 26 22 - 32 mmol/L   Glucose, Bld 130 (H) 65 - 99 mg/dL   BUN 23 (H) 6 - 20 mg/dL   Creatinine, Ser 7.48 (H) 0.44 - 1.00 mg/dL   Calcium 8.0 (L) 8.9 - 10.3 mg/dL   Phosphorus 2.6 2.5 - 4.6 mg/dL   Albumin 2.3 (L) 3.5 - 5.0 g/dL   GFR calc non Af Amer 6 (L) >60 mL/min   GFR calc Af Amer 7 (L) >60 mL/min    Comment: (NOTE) The eGFR has been calculated using the CKD EPI equation. This calculation has not been validated in all clinical situations. eGFR's persistently <60 mL/min signify possible Chronic Kidney Disease.    Anion gap 9 5 - 15  Culture, blood (routine x 2)     Status: None (Preliminary result)   Collection Time: 04/09/15  9:50 AM  Result Value Ref Range   Specimen Description BLOOD GRAFT    Special Requests BOTTLES DRAWN AEROBIC AND ANAEROBIC 10MLS    Culture NO GROWTH 1 DAY    Report Status PENDING   Glucose, capillary     Status: Abnormal   Collection Time: 04/09/15 11:38 AM  Result Value Ref Range   Glucose-Capillary 162 (H) 65 - 99 mg/dL  Glucose, capillary     Status: Abnormal   Collection Time:  04/09/15  5:00 PM  Result Value Ref Range   Glucose-Capillary 329 (H) 65 - 99 mg/dL  Glucose, capillary     Status: Abnormal   Collection Time: 04/09/15  8:48 PM  Result Value Ref Range   Glucose-Capillary 148 (H) 65 - 99 mg/dL  Culture, blood (routine x 2)     Status: None (Preliminary result)   Collection Time: 04/09/15 10:00 PM  Result Value Ref Range   Specimen Description BLOOD RIGHT HAND    Special Requests      BOTTLES DRAWN AEROBIC AND ANAEROBIC 10CC BLUE 8CC PURPLE   Culture NO GROWTH < 24 HOURS    Report Status  PENDING   Glucose, capillary     Status: Abnormal   Collection Time: 04/09/15 11:42 PM  Result Value Ref Range   Glucose-Capillary 170 (H) 65 - 99 mg/dL  Glucose, capillary     Status: None   Collection Time: 04/10/15  2:24 AM  Result Value Ref Range   Glucose-Capillary 67 65 - 99 mg/dL  Glucose, capillary     Status: None   Collection Time: 04/10/15  4:04 AM  Result Value Ref Range   Glucose-Capillary 98 65 - 99 mg/dL  Glucose, capillary     Status: Abnormal   Collection Time: 04/10/15  7:02 AM  Result Value Ref Range   Glucose-Capillary 106 (H) 65 - 99 mg/dL  Glucose, capillary     Status: None   Collection Time: 04/10/15  8:34 AM  Result Value Ref Range   Glucose-Capillary 97 65 - 99 mg/dL  CBC with Differential/Platelet     Status: Abnormal   Collection Time: 04/10/15 10:35 AM  Result Value Ref Range   WBC 7.9 4.0 - 10.5 K/uL   RBC 3.33 (L) 3.87 - 5.11 MIL/uL   Hemoglobin 9.6 (L) 12.0 - 15.0 g/dL   HCT 32.4 (L) 36.0 - 46.0 %   MCV 97.3 78.0 - 100.0 fL   MCH 28.8 26.0 - 34.0 pg   MCHC 29.6 (L) 30.0 - 36.0 g/dL   RDW 18.6 (H) 11.5 - 15.5 %   Platelets 161 150 - 400 K/uL   Neutrophils Relative % 64 %   Neutro Abs 5.0 1.7 - 7.7 K/uL   Lymphocytes Relative 26 %   Lymphs Abs 2.1 0.7 - 4.0 K/uL   Monocytes Relative 6 %   Monocytes Absolute 0.5 0.1 - 1.0 K/uL   Eosinophils Relative 4 %   Eosinophils Absolute 0.3 0.0 - 0.7 K/uL   Basophils Relative 0 %   Basophils Absolute 0.0 0.0 - 0.1 K/uL  Renal function panel     Status: Abnormal   Collection Time: 04/10/15 10:35 AM  Result Value Ref Range   Sodium 129 (L) 135 - 145 mmol/L   Potassium 4.2 3.5 - 5.1 mmol/L   Chloride 93 (L) 101 - 111 mmol/L   CO2 28 22 - 32 mmol/L   Glucose, Bld 171 (H) 65 - 99 mg/dL   BUN 12 6 - 20 mg/dL   Creatinine, Ser 5.14 (H) 0.44 - 1.00 mg/dL   Calcium 8.1 (L) 8.9 - 10.3 mg/dL   Phosphorus 2.1 (L) 2.5 - 4.6 mg/dL   Albumin 2.4 (L) 3.5 - 5.0 g/dL   GFR calc non Af Amer 9 (L) >60 mL/min    GFR calc Af Amer 11 (L) >60 mL/min    Comment: (NOTE) The eGFR has been calculated using the CKD EPI equation. This calculation has not been validated in all clinical  situations. eGFR's persistently <60 mL/min signify possible Chronic Kidney Disease.    Anion gap 8 5 - 15  Glucose, capillary     Status: Abnormal   Collection Time: 04/10/15 12:30 PM  Result Value Ref Range   Glucose-Capillary 235 (H) 65 - 99 mg/dL  Protime-INR     Status: Abnormal   Collection Time: 04/10/15 12:55 PM  Result Value Ref Range   Prothrombin Time 21.2 (H) 11.6 - 15.2 seconds   INR 1.85 (H) 0.00 - 1.49  Glucose, capillary     Status: Abnormal   Collection Time: 04/10/15  5:01 PM  Result Value Ref Range   Glucose-Capillary 226 (H) 65 - 99 mg/dL    No results found.  Review of Systems  Constitutional: Positive for fever and malaise/fatigue. Negative for chills and weight loss.  Respiratory: Negative for shortness of breath and wheezing.   Cardiovascular: Negative for chest pain.  Musculoskeletal: Positive for neck pain.  Neurological: Negative for dizziness and loss of consciousness.  Endo/Heme/Allergies: Bruises/bleeds easily.   Blood pressure 110/46, pulse 93, temperature 98.2 F (36.8 C), temperature source Oral, resp. rate 16, height '5\' 1"'  (1.549 m), weight 151 lb 3.8 oz (68.6 kg), last menstrual period 03/02/2015, SpO2 94 %, unknown if currently breastfeeding. Physical Exam  Vitals reviewed. Constitutional: [name] is oriented to person, place, and time. No distress.  Appears older than stated age.  HENT:  Head: Normocephalic and atraumatic.  Eyes: EOM are normal. No scleral icterus.  Neck: Neck supple.  Cardiovascular: Normal rate and regular rhythm.   Respiratory: Breath sounds normal. [name] has no wheezes.  GI: Soft. There is no tenderness.  Lymphadenopathy:    [name] has cervical adenopathy (tender to palpation).  Neurological: [name] is alert and oriented to person, place, and time. No cranial  nerve deficit. [name] exhibits normal muscle tone.  Skin: Skin is warm and dry.    Assessment/Plan: 44 yo woman with multiple medical problems who presents with adenopathy. The most obvious concern is lymphoma. A core needle biopsy was negative and [name] needs a lymph node biopsy for diagnostic purposes.   I offered to do a right supraclavicular lymph node biopsy. I informed her of the indications, risks, benefits and alternatives. [name] is aware of the general nature of the procedure. It will be done under local with IV sedation. [name] is aware the risks include but are not limited bleeding, infection, failure to make a diagnosis, and lymphocele. [name] agrees to proceed.  Plan surgery tomorrow  Her INR was 1.8 today. Holding coumadin tonight should be adequate.  Melrose Nakayama 04/10/2015, 7:15 PM

## 2015-04-10 NOTE — Consult Note (Signed)
PHARMACY NOTE  Consult :  Heparin bridging while Coumadin held pending surgery Indication :  History of LLE DVT  Heparin Dosing Wt :  62.5 kg  LABS :  Recent Labs  04/09/15 0720 04/10/15 1035 04/10/15 1255  HGB 9.5* 9.6*  --   HCT 30.8* 32.4*  --   PLT 150 161  --   LABPROT 21.5*  --  21.2*  INR 1.88*  --  1.85*  CREATININE 7.48* 5.14*  --     MEDICATION: Scheduled:  Scheduled:  . calcium acetate  667 mg Oral BID WC  . clonazePAM  1 mg Oral QHS  . darbepoetin (ARANESP) injection - DIALYSIS  200 mcg Intravenous Q Thu-HD  . dextromethorphan-guaiFENesin  1 tablet Oral BID  . dextrose  1 ampule Intravenous Once  . diclofenac sodium  2 g Topical QID  . doxercalciferol  1 mcg Intravenous Q T,Th,Sa-HD  . feeding supplement (NEPRO CARB STEADY)  237 mL Oral BID BM  . gabapentin  100 mg Oral Daily  . guaiFENesin  600 mg Oral BID  . imipenem-cilastatin  250 mg Intravenous Q12H  . insulin aspart  0-5 Units Subcutaneous TID WC  . insulin aspart  2 Units Subcutaneous TID WC  . insulin glargine  2 Units Subcutaneous Q2200  . insulin glargine  4 Units Subcutaneous q morning - 10a  . lidocaine  1 patch Transdermal Q24H  . metoCLOPramide  10 mg Oral TID AC & HS  . montelukast  10 mg Oral QHS  . multivitamin  1 tablet Oral Daily  . pantoprazole  40 mg Oral QHS  . simvastatin  20 mg Oral QHS  . vancomycin  750 mg Intravenous Q T,Th,Sa-HD   Infusion[s]: Infusions:  . HEPARIN BRIDGING to be started.  900 units/hr   ASSESSMENT :  44 y.o. female is currently on Coumadin for history of LLE DVT.  Patient has schedule lymph node biopsy in 1-2 days.  Coumadin to be held and Heparin started for bridging pending surgery.   Today's INR 1.85.  Heparin will be started since INR < 2.  No evidence of bleeding complications observed.  GOAL :  Heparin Level  0.3 - 0.7 units/ml  TARGET INR 2-3 when Coumadin restarted.  PLAN : 1. Hold Coumadin pending  surgery. 2. Begin Heparin, no load, at 900 units/hr. 3. Will check Heparin level in 8 hours, then  4. Daily Heparin Levels, Platelet counts, CBC.  Monitor for bleeding complications   Marthenia Rolling,  Pharm.D   04/10/2015,  2:11 PM

## 2015-04-10 NOTE — Progress Notes (Addendum)
[place] for Heparin Indication: DVT  Allergies  Allergen Reactions  . Depakote [Divalproex Sodium] Other (See Comments)    Pt gets "delirious"  . Morphine And Related Nausea And Vomiting and Other (See Comments)    "makes me delirious"  . Penicillins Anaphylaxis    Tolerated Zosyn Dec 2014  . Tramadol Nausea And Vomiting  . Imitrex [Sumatriptan] Other (See Comments)    seizures  . Vicodin [Hydrocodone-Acetaminophen] Itching and Rash    Patient Measurements: Height: 5\' 1"  (154.9 cm) Weight: 154 lb 5.2 oz (70 kg) IBW/kg (Calculated) : 47.8 Heparin Dosing Weight: 65 kg  Vital Signs: Temp: 99.2 F (37.3 C) (11/13 2054) Temp Source: Oral (11/13 2054) BP: 120/48 mmHg (11/13 2054) Pulse Rate: 93 (11/13 2054)  Labs:  Recent Labs  04/09/15 0720 04/10/15 1035 04/10/15 1255 04/10/15 2252  HGB 9.5* 9.6*  --   --   HCT 30.8* 32.4*  --   --   PLT 150 161  --   --   LABPROT 21.5*  --  21.2*  --   INR 1.88*  --  1.85*  --   HEPARINUNFRC  --   --   --  0.15*  CREATININE 7.48* 5.14*  --   --     Estimated Creatinine Clearance: 12.8 mL/min (by C-G formula based on Cr of 5.14).   Assessment: 44 y.o. female with h/o DVT awaiting lymph node biopsy, Coumadin on hold, for heparin Goal of Therapy:  Heparin level 0.3-0.7 units/ml Monitor platelets by anticoagulation protocol: Yes   Plan:  Increase Heparin 1100 units/hr Follow up after biospy  Revere Maahs, Bronson Curb 04/10/2015,11:30 PM

## 2015-04-10 NOTE — Progress Notes (Signed)
Patient ID: [name], female   DOB: 1970/07/19, 44 y.o.   MRN: [id number]  [facility] Progress Note   Assessment/ Plan:   1. R chest and neck pain - normal lymphatic tissue seen on FNA by IR and unable to get an excisional biopsy as palpable nodes felt too small. Awaiting medical stability to undergo endoscopy/gastric biopsy given thickening seen on CT scan. 2. MRSE bacteremia/possible Pseudomonas bacteremia: No evidence of SBE on echocardiogram-on vancomycin and Primaxin per recommendations of infectious disease.  3. ESRD TTS - no acute dialysis needs noted today-planned for next hemodialysis again on Tuesday. 4. BP/volume- she appears to be euvolemic on physical exam and blood pressures appear to be acceptable 5. 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 DM per primary - BS labile - diet is very high in simple carbs- needs complex carbs and protein 9. Malnutrition: Likely from recent infection/chronic illness and possible malignancy given diffuse adenopathy/gastric wall thickening and spiculated lesion in pectoralis muscle-awaiting definitive diagnosis. Continue renal diet/protein supplementation  Subjective:   Continues to have intermittent neck pain-yesterday evening felt as if the left neck was swelling as well however, this appears to have resolved this morning.   Objective:   BP 107/56 mmHg  Pulse 87  Temp(Src) 98.3 F (36.8 C) (Oral)  Resp 18  Ht 5\' 1"  (1.549 m)  Wt 68.6 kg (151 lb 3.8 oz)  BMI 28.59 kg/m2  SpO2 98%  LMP 03/02/2015  Physical Exam: Gen: Comfortably resting in bed CVS: Pulse regular in rate and rhythm Resp: Clear to auscultation bilaterally-occasional expiratory  wheeze Abd: Soft, obese, nontender Ext: No lower extremity edema  Labs: BMET  Recent Labs Lab 04/05/15 0022 04/05/15 1112 04/06/15 0430 04/07/15 0743 04/09/15 0720  NA 128* 128* 129* 126* 131*  K 4.6 5.1 4.5 3.5 4.0  CL 92* 93* 90* 89* 96*  CO2 23 19* 25 25 26   GLUCOSE 238* 254* 532* 122* 130*  BUN 42* 49* 23* 31* 23*  CREATININE 9.32* 10.31* 5.97* 7.87* 7.48*  CALCIUM 8.2* 8.2* 8.2* 8.1* 8.0*  PHOS  --  4.8*  --  3.3 2.6   CBC  Recent Labs Lab 04/05/15 1112 04/06/15 0430 04/07/15 0558 04/09/15 0720  WBC 7.0 6.8 7.5 7.6  HGB 8.8* 8.8* 9.2* 9.5*  HCT 28.3* 29.5* 29.4* 30.8*  MCV 95.3 94.9 94.8 96.9  PLT 164 180 125* 150   Medications:    . calcium acetate  667 mg Oral BID WC  . clonazePAM  1 mg Oral QHS  . darbepoetin (ARANESP) injection - DIALYSIS  200 mcg Intravenous Q Thu-HD  . dextromethorphan-guaiFENesin  1 tablet Oral BID  . dextrose  1 ampule Intravenous Once  . diclofenac sodium  2 g Topical QID  . doxercalciferol  1 mcg Intravenous Q T,Th,Sa-HD  . feeding supplement (NEPRO CARB STEADY)  237 mL Oral BID BM  . gabapentin  100 mg Oral Daily  . guaiFENesin  600 mg Oral BID  . imipenem-cilastatin  250 mg Intravenous Q12H  . insulin aspart  0-5 Units Subcutaneous TID WC  . insulin aspart  2 Units Subcutaneous TID WC  . insulin glargine  2 Units Subcutaneous Q2200  . insulin glargine  4 Units Subcutaneous q morning -  10a  . lidocaine  1 patch Transdermal Q24H  . metoCLOPramide  10 mg Oral TID AC & HS  . montelukast  10 mg Oral QHS  . multivitamin  1 tablet Oral Daily  . pantoprazole  40 mg Oral QHS  . simvastatin  20 mg Oral QHS  . vancomycin  750 mg Intravenous Q T,Th,Sa-HD  . Warfarin - Pharmacist Dosing Inpatient   Does not apply [id number]   Elmarie Shiley, MD 04/10/2015, 9:06 AM

## 2015-04-10 NOTE — Progress Notes (Addendum)
Subjective: Patient is doing fine. Denies cp/sob/n/v/fever/chills. Complaining about why she is not getting appropriate pain meds. Explained that she already has high dose of pain meds PO ordered.  Objective: Vital signs in last 24 hours: Filed Vitals:   04/09/15 1721 04/09/15 2050 04/10/15 0500 04/10/15 0835  BP: 96/44 103/46  107/56  Pulse: 85 88  87  Temp: 98.5 F (36.9 C) 98.4 F (36.9 C)  98.3 F (36.8 C)  TempSrc: Oral Oral  Oral  Resp: 16 18  18   Height:      Weight:  151 lb 3.8 oz (68.6 kg) 151 lb 3.8 oz (68.6 kg)   SpO2: 100% 96%  98%   Weight change: -6.7 oz (-0.191 kg)  Intake/Output Summary (Last 24 hours) at 04/10/15 1122 Last data filed at 04/10/15 1014  Gross per 24 hour  Intake    960 ml  Output      0 ml  Net    960 ml   Physical Exam: Gen: AAOx3, NAD HEENT: bilateral enlarged supraclavicular LN, tender to palpation on right side CVS: RRR, S1 and S2 appreciated, systolic murmur   Lungs: bibasilar rales Abd: soft, NT, ND. +BS.  Ext: no edema Skin: warm and dry.   Lab Results: Basic Metabolic Panel:  Recent Labs Lab 04/07/15 0743 04/09/15 0720  NA 126* 131*  K 3.5 4.0  CL 89* 96*  CO2 25 26  GLUCOSE 122* 130*  BUN 31* 23*  CREATININE 7.87* 7.48*  CALCIUM 8.1* 8.0*  PHOS 3.3 2.6   Liver Function Tests:  Recent Labs Lab 04/07/15 0743 04/09/15 0720  ALBUMIN 2.3* 2.3*   CBC:  Recent Labs Lab 04/07/15 0558 04/09/15 0720  WBC 7.5 7.6  HGB 9.2* 9.5*  HCT 29.4* 30.8*  MCV 94.8 96.9  PLT 125* 150   CBG:  Recent Labs Lab 04/09/15 2048 04/09/15 2342 04/10/15 0224 04/10/15 0404 04/10/15 0702 04/10/15 0834  GLUCAP 148* 170* 67 98 106* 97   Coagulation:  Recent Labs Lab 04/05/15 0022 04/06/15 0430 04/07/15 0558 04/09/15 0720  LABPROT 18.3* 23.5* 24.7* 21.5*  INR 1.51* 2.11* 2.25* 1.88*    Micro Results: Recent Results (from the past 240 hour(s))  Culture, blood (routine x 2)     Status: None   Collection Time:  04/01/15  7:50 PM  Result Value Ref Range Status   Specimen Description BLOOD RIGHT ARM  Final   Special Requests IN PEDIATRIC BOTTLE 1CC  Final   Culture  Setup Time   Final    GRAM POSITIVE COCCI IN CLUSTERS AEROBIC BOTTLE ONLY CRITICAL RESULT CALLED TO, READ BACK BY AND VERIFIED WITH: A RIO 04/02/15 @ 1435 M VESTAL    Culture STAPHYLOCOCCUS SPECIES (COAGULASE NEGATIVE)  Final   Report Status 04/04/2015 FINAL  Final   Organism ID, Bacteria STAPHYLOCOCCUS SPECIES (COAGULASE NEGATIVE)  Final      Susceptibility   Staphylococcus species (coagulase negative) - MIC*    CIPROFLOXACIN >=8 RESISTANT Resistant     ERYTHROMYCIN >=8 RESISTANT Resistant     GENTAMICIN <=0.5 SENSITIVE Sensitive     OXACILLIN >=4 RESISTANT Resistant     TETRACYCLINE >=16 RESISTANT Resistant     VANCOMYCIN 1 SENSITIVE Sensitive     TRIMETH/SULFA 160 RESISTANT Resistant     CLINDAMYCIN <=0.25 SENSITIVE Sensitive     RIFAMPIN <=0.5 SENSITIVE Sensitive     Inducible Clindamycin NEGATIVE Sensitive     * STAPHYLOCOCCUS SPECIES (COAGULASE NEGATIVE)  Culture, blood (routine x 2)  Status: None   Collection Time: 04/01/15  7:56 PM  Result Value Ref Range Status   Specimen Description BLOOD LEFT HAND  Final   Special Requests IN PEDIATRIC BOTTLE 2CC  Final   Culture  Setup Time   Final    GRAM POSITIVE COCCI IN CLUSTERS AEROBIC BOTTLE ONLY CRITICAL RESULT CALLED TO, READ BACK BY AND VERIFIED WITH: A RIO 04/02/15 @ 37 M VESTAL    Culture   Final    STAPHYLOCOCCUS SPECIES (COAGULASE NEGATIVE) SUSCEPTIBILITIES PERFORMED ON PREVIOUS CULTURE WITHIN THE LAST 5 DAYS.    Report Status 04/04/2015 FINAL  Final  Culture, blood (routine x 2)     Status: None   Collection Time: 04/03/15 11:45 AM  Result Value Ref Range Status   Specimen Description BLOOD RIGHT HAND  Final   Special Requests IN PEDIATRIC BOTTLE 4CC  Final   Culture  Setup Time   Final    GRAM POSITIVE COCCI IN CLUSTERS CRITICAL RESULT CALLED TO, READ  BACK BY AND VERIFIED WITH: Louanne Skye 10:25 04/04/15 ([email or web address])    Culture   Final    STAPHYLOCOCCUS SPECIES (COAGULASE NEGATIVE) SUSCEPTIBILITIES PERFORMED ON PREVIOUS CULTURE WITHIN THE LAST 5 DAYS. PSEUDOMONAS FLUORESCENS CRITICAL RESULT CALLED TO, READ BACK BY AND VERIFIED WITH: DR [facility]  04/07/15 @ 1113 M VESTAL    Report Status 04/09/2015 FINAL  Final   Organism ID, Bacteria PSEUDOMONAS FLUORESCENS  Final      Susceptibility   Pseudomonas fluorescens - MIC*    CEFTAZIDIME 16 INTERMEDIATE Intermediate     CIPROFLOXACIN >=4 RESISTANT Resistant     GENTAMICIN <=1 SENSITIVE Sensitive     IMIPENEM 0.5 SENSITIVE Sensitive     * PSEUDOMONAS FLUORESCENS  Culture, blood (routine x 2)     Status: None   Collection Time: 04/03/15  7:35 PM  Result Value Ref Range Status   Specimen Description BLOOD RIGHT ANTECUBITAL  Final   Special Requests BOTTLES DRAWN AEROBIC ONLY 8CC  Final   Culture NO GROWTH 5 DAYS  Final   Report Status 04/08/2015 FINAL  Final  Tissue culture     Status: None   Collection Time: 04/04/15  5:15 PM  Result Value Ref Range Status   Specimen Description TISSUE  Final   Special Requests HEAD NECK LYMPHADENOPATHY  Final   Gram Stain   Final    NO WBC SEEN NO ORGANISMS SEEN Performed at [facility]    Culture   Final    NO GROWTH 3 DAYS Performed at [facility]    Report Status 04/08/2015 FINAL  Final  Culture, blood (routine x 2)     Status: None   Collection Time: 04/05/15 12:22 AM  Result Value Ref Range Status   Specimen Description BLOOD RIGHT HAND  Final   Special Requests IN PEDIATRIC BOTTLE 3CC  Final   Culture  Setup Time   Final    GRAM POSITIVE COCCI IN CHAINS IN PAIRS AEROBIC BOTTLE ONLY CRITICAL RESULT CALLED TO, READ BACK BY AND VERIFIED WITHDwaine Gale RN [id number] 04/05/15 A BROWNING    Culture   Final    VIRIDANS STREPTOCOCCUS STAPHYLOCOCCUS SPECIES (COAGULASE NEGATIVE) SUSCEPTIBILITIES PERFORMED ON PREVIOUS CULTURE WITHIN  THE LAST 5 DAYS.    Report Status 04/09/2015 FINAL  Final   Organism ID, Bacteria VIRIDANS STREPTOCOCCUS  Final      Susceptibility   Viridans streptococcus - MIC*    ERYTHROMYCIN >=8 RESISTANT Resistant     TETRACYCLINE >=16 RESISTANT Resistant  VANCOMYCIN 0.5 SENSITIVE Sensitive     CLINDAMYCIN >=1 RESISTANT Resistant     * VIRIDANS STREPTOCOCCUS  Culture, blood (routine x 2)     Status: None (Preliminary result)   Collection Time: 04/05/15 12:37 AM  Result Value Ref Range Status   Specimen Description BLOOD RIGHT HAND  Final   Special Requests BOTTLES DRAWN AEROBIC AND ANAEROBIC 4CC  Final   Culture NO GROWTH 4 DAYS  Final   Report Status PENDING  Incomplete   Studies/Results: No results found. Medications: I have reviewed the patient's current medications. Scheduled Meds: . calcium acetate  667 mg Oral BID WC  . clonazePAM  1 mg Oral QHS  . darbepoetin (ARANESP) injection - DIALYSIS  200 mcg Intravenous Q Thu-HD  . dextromethorphan-guaiFENesin  1 tablet Oral BID  . dextrose  1 ampule Intravenous Once  . diclofenac sodium  2 g Topical QID  . doxercalciferol  1 mcg Intravenous Q T,Th,Sa-HD  . feeding supplement (NEPRO CARB STEADY)  237 mL Oral BID BM  . gabapentin  100 mg Oral Daily  . guaiFENesin  600 mg Oral BID  . imipenem-cilastatin  250 mg Intravenous Q12H  . insulin aspart  0-5 Units Subcutaneous TID WC  . insulin aspart  2 Units Subcutaneous TID WC  . insulin glargine  2 Units Subcutaneous Q2200  . insulin glargine  4 Units Subcutaneous q morning - 10a  . lidocaine  1 patch Transdermal Q24H  . metoCLOPramide  10 mg Oral TID AC & HS  . montelukast  10 mg Oral QHS  . multivitamin  1 tablet Oral Daily  . pantoprazole  40 mg Oral QHS  . simvastatin  20 mg Oral QHS  . 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 Also got CT abdomen which shows small ascites, some cirrhotic changes of liver, diffuse stomach wall thickening concerning for gastritis vs neoplastic involvement (gastric carcinoma vs lymphoma?). HIV and LDH negative. Needle LN biopsy was done by IR showing benign lymph node tissue. However, insufficient cells present for flow cytometry analysis. Surgery was consulted but informed me the neck LNs are not big enough in size for an excisional biopsy. Since the CT also showed diffuse stomach wall thickening. I spoke to Dr. Paulita Fujita [facility]) and he believes the patient is not currently stable to undergo tissue biopsy via EGD due to her fevers, high blood glucose values, and therapeutic INR.   - Discussed case with CT surgery team. Appreciate their help with this. Plan is for excisional lymph node biopsy 11/14 with CT surgery.  - hold coumadin for now. Bridge with heparin.  -Oxy IR 10 q4hr + dilaudid for breakthrough.   -Voltaren gel prn pain  Fever - Patient has been afebrile for approximately 48 hrs now. [place] growing Strep viridans, coagulase negative staph, and pseudomonas fluorescence. Vancomycin started on 11/5. TEE did not show any vegetations making endocarditis less likely. ID has been consulted and is  on board. They believe differential for fever includes infection vs tumor fever if patient does indeed have lymphoma/ mets. Skin was examined and no rashes noted. Vascular surgery was  consulted and they do not think her dialysis access sites appear infected. Duplex showing a few small isolated areas of fluid around the grafts but no obvious signs of infection. [place] of abdomen showing trace perihepatic ascites. Panoramic dental x-ray did not show dental abscess or osseous abnormality. -cont vancomycin with HD -cont Imipenem-Cilastatin for pseudomonas coverage  -tagged WBC scan on Monday (11/14) -Tylenol prn  Cough: Patient's cough has improved. Lung exam today remarkable for bibasilar rales, likely 2/2 atelectasis. Currently not complaining of any SOB. Satting 100% on RA. CXR on 11/7 consistent with atelectasis/ mild interstitial edema/ infection. BCX growing Strep viridans, coagulase negative staph, and pseudomonas fluorescence.  -Antibiotics as above  -continue Mucinex-DM for congestion  -Albuterol nebulizer prn -Singulair 10 mg daily -continue Incentive spirometer -encourage ambulation (PT on board)  Severe hyperglycemia with DM Type I - Patient's blood glucose in the 130s this am.   -continue Lantus 4 u in then morning and 2u in the evening -continue Novolog 2 u TID -Modified sensitive-SSI  Pseudohyponatremia - monitor with insulin regimen.  H/o LLE DVT (01/19/2015) - stop coumadin for now. Bridge with ehparin.   HFpEF: Stable. TEE showing LVEF >55%, mild MR, moderate TR, and trivial AR. No PR.  -Monitor   ESRD on HD (TTS): New left AVG in 12/2014. HD today.  - appreciate nephro recs.   HTN: BP stable. Continue holding home Amlodipine 10 mg qd, Metoprolol 50 mg qd for now for low BP's.   HLD: continue Zocor 20 mg qd  GERD: Protonix 40 mg qd   Insomnia: continue Ambien qhs    Code: Full  Dispo: Disposition is deferred at this time, awaiting improvement of current medical problems.  Anticipated discharge in approximately 2-3 day(s).   The patient does have a current PCP [name], MD) and does need an [facility] hospital follow-up appointment after  discharge.  The patient does not have transportation limitations that hinder transportation to clinic appointments.  .Services Needed at time of discharge: Y = Yes, Blank = No PT:   OT:   RN:   Equipment:   Other:     LOS: 10 days   Dellia Nims, MD 04/10/2015, 11:22 AM

## 2015-04-11 ENCOUNTER — Encounter (HOSPITAL_COMMUNITY)
Admission: EM | Disposition: A | Discharge status: Home / Self Care | Payer: Self-pay | Source: Home / Self Care | Attending: Internal Medicine

## 2015-04-11 ENCOUNTER — Inpatient Hospital Stay (HOSPITAL_COMMUNITY): Payer: Medicare Other | Admitting: Certified Registered"

## 2015-04-11 HISTORY — PX: LYMPH NODE BIOPSY: SHX201

## 2015-04-11 LAB — PROTIME-INR
INR: 1.8 — ABNORMAL HIGH (ref 0.00–1.49)
PROTHROMBIN TIME: 20.8 s — AB (ref 11.6–15.2)

## 2015-04-11 LAB — GLUCOSE, CAPILLARY
GLUCOSE-CAPILLARY: 119 mg/dL — AB (ref 65–99)
GLUCOSE-CAPILLARY: 254 mg/dL — AB (ref 65–99)
Glucose-Capillary: 93 mg/dL (ref 65–99)
Glucose-Capillary: 98 mg/dL (ref 65–99)
Glucose-Capillary: 148 mg/dL — ABNORMAL HIGH (ref 65–99)
Glucose-Capillary: 182 mg/dL — ABNORMAL HIGH (ref 65–99)
Glucose-Capillary: 241 mg/dL — ABNORMAL HIGH (ref 65–99)

## 2015-04-11 LAB — CBC
HCT: 33.4 % — ABNORMAL LOW (ref 36.0–46.0)
HEMOGLOBIN: 10 g/dL — AB (ref 12.0–15.0)
MCH: 29.2 pg (ref 26.0–34.0)
MCHC: 29.9 g/dL — AB (ref 30.0–36.0)
MCV: 97.7 fL (ref 78.0–100.0)
PLATELETS: 142 10*3/uL — AB (ref 150–400)
RBC: 3.42 MIL/uL — ABNORMAL LOW (ref 3.87–5.11)
RDW: 18.9 % — ABNORMAL HIGH (ref 11.5–15.5)
WBC: 10.6 10*3/uL — ABNORMAL HIGH (ref 4.0–10.5)

## 2015-04-11 SURGERY — LYMPH NODE BIOPSY
Anesthesia: Monitor Anesthesia Care | Laterality: Right

## 2015-04-11 MED ORDER — SODIUM CHLORIDE 0.9 % IJ SOLN
INTRAMUSCULAR | Status: AC
  Filled 2015-04-11: qty 10

## 2015-04-11 MED ORDER — FENTANYL CITRATE (PF) 100 MCG/2ML IJ SOLN
INTRAMUSCULAR | Status: DC | PRN
  Administered 2015-04-11: 50 ug via INTRAVENOUS
  Administered 2015-04-11 (×3): 25 ug via INTRAVENOUS

## 2015-04-11 MED ORDER — 0.9 % SODIUM CHLORIDE (POUR BTL) OPTIME
TOPICAL | Status: DC | PRN
  Administered 2015-04-11: 1000 mL

## 2015-04-11 MED ORDER — WARFARIN - PHARMACIST DOSING INPATIENT
Freq: Every day | Status: DC
  Administered 2015-04-11: 17:00:00

## 2015-04-11 MED ORDER — ENOXAPARIN SODIUM 80 MG/0.8ML ~~LOC~~ SOLN: 70.0000 mg | SUBCUTANEOUS | Status: DC

## 2015-04-11 MED ORDER — ROCURONIUM BROMIDE 50 MG/5ML IV SOLN
INTRAVENOUS | Status: AC
  Filled 2015-04-11: qty 1

## 2015-04-11 MED ORDER — PROPOFOL 10 MG/ML IV BOLUS
INTRAVENOUS | Status: AC
  Filled 2015-04-11: qty 20

## 2015-04-11 MED ORDER — LIDOCAINE-EPINEPHRINE (PF) 1 %-1:200000 IJ SOLN
INTRAMUSCULAR | Status: DC | PRN
  Administered 2015-04-11: 14 mL

## 2015-04-11 MED ORDER — SODIUM CHLORIDE 0.9 % IV SOLN
INTRAVENOUS | Status: DC
  Administered 2015-04-11: 11:00:00 via INTRAVENOUS

## 2015-04-11 MED ORDER — EPHEDRINE SULFATE 50 MG/ML IJ SOLN
INTRAMUSCULAR | Status: AC
  Filled 2015-04-11: qty 1

## 2015-04-11 MED ORDER — LIDOCAINE HCL (CARDIAC) 20 MG/ML IV SOLN
INTRAVENOUS | Status: AC
  Filled 2015-04-11: qty 5

## 2015-04-11 MED ORDER — FENTANYL CITRATE (PF) 250 MCG/5ML IJ SOLN
INTRAMUSCULAR | Status: AC
  Filled 2015-04-11: qty 5

## 2015-04-11 MED ORDER — SUCCINYLCHOLINE CHLORIDE 20 MG/ML IJ SOLN
INTRAMUSCULAR | Status: AC
  Filled 2015-04-11: qty 1

## 2015-04-11 MED ORDER — WARFARIN SODIUM 7.5 MG PO TABS: 7.5000 mg | ORAL_TABLET | Freq: Once | ORAL | Status: DC

## 2015-04-11 MED ORDER — ONDANSETRON HCL 4 MG/2ML IJ SOLN
INTRAMUSCULAR | Status: AC
  Filled 2015-04-11: qty 2

## 2015-04-11 MED ORDER — PROPOFOL 10 MG/ML IV BOLUS
INTRAVENOUS | Status: DC | PRN
  Administered 2015-04-11: 20 mg via INTRAVENOUS

## 2015-04-11 MED ORDER — MIDAZOLAM HCL 5 MG/5ML IJ SOLN
INTRAMUSCULAR | Status: DC | PRN
  Administered 2015-04-11: 1 mg via INTRAVENOUS

## 2015-04-11 MED ORDER — WARFARIN SODIUM 7.5 MG PO TABS
7.5000 mg | ORAL_TABLET | Freq: Once | ORAL | Status: AC
  Administered 2015-04-11: 7.5 mg via ORAL
  Filled 2015-04-11: qty 1

## 2015-04-11 MED ORDER — MIDAZOLAM HCL 2 MG/2ML IJ SOLN
INTRAMUSCULAR | Status: AC
  Filled 2015-04-11: qty 4

## 2015-04-11 MED ORDER — ENOXAPARIN SODIUM 80 MG/0.8ML ~~LOC~~ SOLN
1.0000 mg/kg | SUBCUTANEOUS | Status: DC
  Administered 2015-04-11 – 2015-04-12 (×2): 70 mg via SUBCUTANEOUS
  Filled 2015-04-11 (×2): qty 0.8

## 2015-04-11 MED ORDER — WARFARIN - PHARMACIST DOSING INPATIENT: Freq: Every day | Status: DC

## 2015-04-11 MED ORDER — PROPOFOL 500 MG/50ML IV EMUL
INTRAVENOUS | Status: DC | PRN
  Administered 2015-04-11: 50 ug/kg/min via INTRAVENOUS

## 2015-04-11 MED ORDER — LIDOCAINE-EPINEPHRINE (PF) 1 %-1:200000 IJ SOLN
INTRAMUSCULAR | Status: AC
  Filled 2015-04-11: qty 30

## 2015-04-11 MED ORDER — PROMETHAZINE HCL 25 MG/ML IJ SOLN
25.0000 mg | Freq: Four times a day (QID) | INTRAMUSCULAR | Status: DC | PRN
  Administered 2015-04-11: 25 mg via INTRAVENOUS
  Filled 2015-04-11: qty 1

## 2015-04-11 MED ORDER — HYDROMORPHONE HCL 1 MG/ML IJ SOLN
INTRAMUSCULAR | Status: AC
  Filled 2015-04-11: qty 1

## 2015-04-11 SURGICAL SUPPLY — 42 items
CANISTER SUCTION 2500CC (MISCELLANEOUS) ×3 IMPLANT
CLIP TI MEDIUM 24 (CLIP) ×3 IMPLANT
CLIP TI MEDIUM 6 (CLIP) IMPLANT
CLIP TI WIDE RED SMALL 24 (CLIP) ×3 IMPLANT
CLIP TI WIDE RED SMALL 6 (CLIP) ×3 IMPLANT
CONT SPEC 4OZ CLIKSEAL STRL BL (MISCELLANEOUS) ×12 IMPLANT
COVER SURGICAL LIGHT HANDLE (MISCELLANEOUS) ×3 IMPLANT
DERMABOND ADVANCED (GAUZE/BANDAGES/DRESSINGS) ×2
DERMABOND ADVANCED .7 DNX12 (GAUZE/BANDAGES/DRESSINGS) ×1 IMPLANT
DRAPE CHEST BREAST 15X10 FENES (DRAPES) ×3 IMPLANT
ELECT CAUTERY BLADE 6.4 (BLADE) ×3 IMPLANT
ELECT REM PT RETURN 9FT ADLT (ELECTROSURGICAL) ×3
ELECTRODE REM PT RTRN 9FT ADLT (ELECTROSURGICAL) ×1 IMPLANT
GAUZE SPONGE 4X4 12PLY STRL (GAUZE/BANDAGES/DRESSINGS) ×3 IMPLANT
GLOVE EUDERMIC 7 POWDERFREE (GLOVE) IMPLANT
GLOVE SURG SIGNA 7.5 PF LTX (GLOVE) ×3 IMPLANT
GOWN STRL REUS W/ TWL LRG LVL3 (GOWN DISPOSABLE) ×1 IMPLANT
GOWN STRL REUS W/ TWL XL LVL3 (GOWN DISPOSABLE) ×1 IMPLANT
GOWN STRL REUS W/TWL LRG LVL3 (GOWN DISPOSABLE) ×2
GOWN STRL REUS W/TWL XL LVL3 (GOWN DISPOSABLE) ×2
KIT BASIN OR (CUSTOM PROCEDURE TRAY) ×3 IMPLANT
KIT ROOM TURNOVER OR (KITS) ×3 IMPLANT
NEEDLE 22X1 1/2 (OR ONLY) (NEEDLE) ×3 IMPLANT
NS IRRIG 1000ML POUR BTL (IV SOLUTION) ×3 IMPLANT
PACK GENERAL/GYN (CUSTOM PROCEDURE TRAY) ×3 IMPLANT
PAD ARMBOARD 7.5X6 YLW CONV (MISCELLANEOUS) ×9 IMPLANT
SPONGE INTESTINAL PEANUT (DISPOSABLE) IMPLANT
SUT PROLENE 4 0 RB 1 (SUTURE) ×2
SUT PROLENE 4-0 RB1 .5 CRCL 36 (SUTURE) ×1 IMPLANT
SUT SILK 2 0 TIES 10X30 (SUTURE) IMPLANT
SUT SILK 3 0 (SUTURE) ×2
SUT SILK 3-0 18XBRD TIE 12 (SUTURE) ×1 IMPLANT
SUT VIC AB 2-0 CT1 27 (SUTURE) ×2
SUT VIC AB 2-0 CT1 TAPERPNT 27 (SUTURE) ×1 IMPLANT
SUT VIC AB 3-0 SH 27 (SUTURE) ×2
SUT VIC AB 3-0 SH 27X BRD (SUTURE) ×1 IMPLANT
SUT VIC AB 3-0 X1 27 (SUTURE) ×3 IMPLANT
SUT VICRYL 4-0 PS2 18IN ABS (SUTURE) ×3 IMPLANT
SYR CONTROL 10ML LL (SYRINGE) ×3 IMPLANT
TOWEL OR 17X24 6PK STRL BLUE (TOWEL DISPOSABLE) ×3 IMPLANT
TOWEL OR 17X26 10 PK STRL BLUE (TOWEL DISPOSABLE) ×3 IMPLANT
WATER STERILE IRR 1000ML POUR (IV SOLUTION) IMPLANT

## 2015-04-11 NOTE — Progress Notes (Signed)
[place] for Infectious Disease   Reason for visit: Follow up on fever, multi organism bacteremia  Interval History: she has remained afebrile since 11/10.  No associated chills.   Physical Exam: Constitutional:  Filed Vitals:   04/11/15 0457  BP: 124/59  Pulse: 89  Temp: 98.4 F (36.9 C)  Resp: 18   patient appears in NAD Eyes: anicteric Respiratory: Normal respiratory effort; CTA B Cardiovascular: RRR GI: soft, nt  Review of Systems: Hematologic/lymphatic: negative for lymphadenopathy Musculoskeletal: negative for myalgias and arthralgias  Lab Results  Component Value Date   WBC 7.9 04/10/2015   HGB 9.6* 04/10/2015   HCT 32.4* 04/10/2015   MCV 97.3 04/10/2015   PLT 161 04/10/2015    Lab Results  Component Value Date   CREATININE 5.14* 04/10/2015   BUN 12 04/10/2015   NA 129* 04/10/2015   K 4.2 04/10/2015   CL 93* 04/10/2015   CO2 28 04/10/2015    Lab Results  Component Value Date   ALT 15 03/31/2015   AST 32 03/31/2015   ALKPHOS 174* 03/31/2015     Microbiology: Recent Results (from the past 240 hour(s))  Culture, blood (routine x 2)     Status: None   Collection Time: 04/01/15  7:50 PM  Result Value Ref Range Status   Specimen Description BLOOD RIGHT ARM  Final   Special Requests IN PEDIATRIC BOTTLE 1CC  Final   Culture  Setup Time   Final    GRAM POSITIVE COCCI IN CLUSTERS AEROBIC BOTTLE ONLY CRITICAL RESULT CALLED TO, READ BACK BY AND VERIFIED WITH: A RIO 04/02/15 @ 35 M VESTAL    Culture STAPHYLOCOCCUS SPECIES (COAGULASE NEGATIVE)  Final   Report Status 04/04/2015 FINAL  Final   Organism ID, Bacteria STAPHYLOCOCCUS SPECIES (COAGULASE NEGATIVE)  Final      Susceptibility   Staphylococcus species (coagulase negative) - MIC*    CIPROFLOXACIN >=8 RESISTANT Resistant     ERYTHROMYCIN >=8 RESISTANT Resistant     GENTAMICIN <=0.5 SENSITIVE Sensitive     OXACILLIN >=4 RESISTANT Resistant     TETRACYCLINE >=16 RESISTANT Resistant    VANCOMYCIN 1 SENSITIVE Sensitive     TRIMETH/SULFA 160 RESISTANT Resistant     CLINDAMYCIN <=0.25 SENSITIVE Sensitive     RIFAMPIN <=0.5 SENSITIVE Sensitive     Inducible Clindamycin NEGATIVE Sensitive     * STAPHYLOCOCCUS SPECIES (COAGULASE NEGATIVE)  Culture, blood (routine x 2)     Status: None   Collection Time: 04/01/15  7:56 PM  Result Value Ref Range Status   Specimen Description BLOOD LEFT HAND  Final   Special Requests IN PEDIATRIC BOTTLE 2CC  Final   Culture  Setup Time   Final    GRAM POSITIVE COCCI IN CLUSTERS AEROBIC BOTTLE ONLY CRITICAL RESULT CALLED TO, READ BACK BY AND VERIFIED WITH: A RIO 04/02/15 @ 1321 M VESTAL    Culture   Final    STAPHYLOCOCCUS SPECIES (COAGULASE NEGATIVE) SUSCEPTIBILITIES PERFORMED ON PREVIOUS CULTURE WITHIN THE LAST 5 DAYS.    Report Status 04/04/2015 FINAL  Final  Culture, blood (routine x 2)     Status: None   Collection Time: 04/03/15 11:45 AM  Result Value Ref Range Status   Specimen Description BLOOD RIGHT HAND  Final   Special Requests IN PEDIATRIC BOTTLE 4CC  Final   Culture  Setup Time   Final    GRAM POSITIVE COCCI IN CLUSTERS CRITICAL RESULT CALLED TO, READ BACK BY AND VERIFIED WITH: Louanne Skye 10:25  04/04/15 ([email or web address])    Culture   Final    STAPHYLOCOCCUS SPECIES (COAGULASE NEGATIVE) SUSCEPTIBILITIES PERFORMED ON PREVIOUS CULTURE WITHIN THE LAST 5 DAYS. PSEUDOMONAS FLUORESCENS CRITICAL RESULT CALLED TO, READ BACK BY AND VERIFIED WITH: DR [facility]  04/07/15 @ 1113 M VESTAL    Report Status 04/09/2015 FINAL  Final   Organism ID, Bacteria PSEUDOMONAS FLUORESCENS  Final      Susceptibility   Pseudomonas fluorescens - MIC*    CEFTAZIDIME 16 INTERMEDIATE Intermediate     CIPROFLOXACIN >=4 RESISTANT Resistant     GENTAMICIN <=1 SENSITIVE Sensitive     IMIPENEM 0.5 SENSITIVE Sensitive     * PSEUDOMONAS FLUORESCENS  Culture, blood (routine x 2)     Status: None   Collection Time: 04/03/15  7:35 PM  Result Value Ref Range Status    Specimen Description BLOOD RIGHT ANTECUBITAL  Final   Special Requests BOTTLES DRAWN AEROBIC ONLY 8CC  Final   Culture NO GROWTH 5 DAYS  Final   Report Status 04/08/2015 FINAL  Final  Tissue culture     Status: None   Collection Time: 04/04/15  5:15 PM  Result Value Ref Range Status   Specimen Description TISSUE  Final   Special Requests HEAD NECK LYMPHADENOPATHY  Final   Gram Stain   Final    NO WBC SEEN NO ORGANISMS SEEN Performed at [facility]    Culture   Final    NO GROWTH 3 DAYS Performed at [facility]    Report Status 04/08/2015 FINAL  Final  Culture, blood (routine x 2)     Status: None   Collection Time: 04/05/15 12:22 AM  Result Value Ref Range Status   Specimen Description BLOOD RIGHT HAND  Final   Special Requests IN PEDIATRIC BOTTLE 3CC  Final   Culture  Setup Time   Final    GRAM POSITIVE COCCI IN CHAINS IN PAIRS AEROBIC BOTTLE ONLY CRITICAL RESULT CALLED TO, READ BACK BY AND VERIFIED WITHDwaine Gale RN [id number] 04/05/15 A BROWNING    Culture   Final    VIRIDANS STREPTOCOCCUS STAPHYLOCOCCUS SPECIES (COAGULASE NEGATIVE) SUSCEPTIBILITIES PERFORMED ON PREVIOUS CULTURE WITHIN THE LAST 5 DAYS.    Report Status 04/09/2015 FINAL  Final   Organism ID, Bacteria VIRIDANS STREPTOCOCCUS  Final      Susceptibility   Viridans streptococcus - MIC*    ERYTHROMYCIN >=8 RESISTANT Resistant     TETRACYCLINE >=16 RESISTANT Resistant     VANCOMYCIN 0.5 SENSITIVE Sensitive     CLINDAMYCIN >=1 RESISTANT Resistant     * VIRIDANS STREPTOCOCCUS  Culture, blood (routine x 2)     Status: None   Collection Time: 04/05/15 12:37 AM  Result Value Ref Range Status   Specimen Description BLOOD RIGHT HAND  Final   Special Requests BOTTLES DRAWN AEROBIC AND ANAEROBIC 4CC  Final   Culture NO GROWTH 5 DAYS  Final   Report Status 04/10/2015 FINAL  Final  Culture, blood (routine x 2)     Status: None (Preliminary result)   Collection Time: 04/09/15  9:50 AM  Result Value  Ref Range Status   Specimen Description BLOOD GRAFT  Final   Special Requests BOTTLES DRAWN AEROBIC AND ANAEROBIC 10MLS  Final   Culture NO GROWTH 1 DAY  Final   Report Status PENDING  Incomplete  Culture, blood (routine x 2)     Status: None (Preliminary result)   Collection Time: 04/09/15 10:00 PM  Result Value Ref Range Status  Specimen Description BLOOD RIGHT HAND  Final   Special Requests   Final    BOTTLES DRAWN AEROBIC AND ANAEROBIC 10CC BLUE 8CC PURPLE   Culture NO GROWTH < 24 HOURS  Final   Report Status PENDING  Incomplete    Impression/Plan:  1. CoNS bacteremia - clear now on vancomycin. Continue with vancomycin for 14 days from first negative culture.   2.  Pseudomonas flourescens bacteremia - continue with imipenem until discharge and stop.  Not known to be a particularly virulent organism so do not need a prolonged treatment.  3. Fever - now resolved.    I will sign off, please call with questions.

## 2015-04-11 NOTE — Brief Op Note (Signed)
03/30/2015 - 04/11/2015  1:04 PM  PATIENT:  [name]  44 y.o. female  PRE-OPERATIVE DIAGNOSIS:  Adenopathy  POST-OPERATIVE DIAGNOSIS:  Adenopathy  PROCEDURE:  Procedure(s): RIGHT SUPRACLAVICULAR LYMPH NODE BIOPSY (Right)  SURGEON:  Surgeon(s) and Role:    * Melrose Nakayama, MD - Primary  ANESTHESIA:   local and IV sedation  EBL:     BLOOD ADMINISTERED:none  DRAINS: none   LOCAL MEDICATIONS USED:  LIDOCAINE  and Amount: 14 ml  SPECIMEN:  Source of Specimen:  supraclavicular and cervical nodes  DISPOSITION OF SPECIMEN:  PATHOLOGY  COUNTS:  YES  PLAN OF CARE: return to inpatient  PATIENT DISPOSITION:  PACU - hemodynamically stable.   Delay start of Pharmacological VTE agent (>24hrs) due to surgical blood loss or risk of bleeding: no

## 2015-04-11 NOTE — Progress Notes (Signed)
Patient seen and examined. Case d/w residents in detail. I agree with findings and plan as documented in Dr. Elon Jester note.  Patient feels well. Fevers have resolved and repeat blood cx from 11/12 with NGTD. Case d/w Dr. Linus Salmons- will maintain imipenem and vancomycin while inpatient and c/w vancomycin only at HD on d/c. Uncertain etiology of infection. TEE with no vegetations and grafts with no sign of infection.  Tagged WBC scan unable to eb done today as unable to obtain sufficient quantity of blood.  Patient is also now s/p LN biopsy for evaluation of lymphadenopathy (possible malignancy). Will f/u path. Hold a/c for now per sx. Resume in AM if ok with surgery.

## 2015-04-11 NOTE — Anesthesia Procedure Notes (Signed)
Procedure Name: MAC Date/Time: 04/11/2015 11:31 AM Performed by: Barrington Ellison Pre-anesthesia Checklist: Patient identified, Emergency Drugs available, Suction available, Patient being monitored and Timeout performed Patient Re-evaluated:Patient Re-evaluated prior to inductionOxygen Delivery Method: Simple face mask Preoxygenation: Pre-oxygenation with 100% oxygen

## 2015-04-11 NOTE — Transfer of Care (Signed)
Immediate Anesthesia Transfer of Care Note  Patient: [name]  Procedure(s) Performed: Procedure(s): RIGHT SUPRACLAVICULAR LYMPH NODE BIOPSY (Right)  Patient Location: PACU  Anesthesia Type:MAC  Level of Consciousness: awake, alert  and oriented  Airway & Oxygen Therapy: Patient Spontanous Breathing  Post-op Assessment: Report given to RN  Post vital signs: Reviewed and stable  Last Vitals:  Filed Vitals:   04/11/15 1005  BP: 141/78  Pulse: 92  Temp: 37 C  Resp: 20    Complications: No apparent anesthesia complications

## 2015-04-11 NOTE — Care Management Important Message (Signed)
Important Message  Patient Details  Name: [name] MRN: [id number] Date of Birth: 1970-09-07   Medicare Important Message Given:  Yes    [name] [name] 04/11/2015, 3:23 PM

## 2015-04-11 NOTE — Anesthesia Postprocedure Evaluation (Signed)
  Anesthesia Post-op Note  Patient: [name]  Procedure(s) Performed: Procedure(s): RIGHT SUPRACLAVICULAR LYMPH NODE BIOPSY (Right)  Patient Location: PACU  Anesthesia Type:MAC  Level of Consciousness: awake and alert   Airway and Oxygen Therapy: Patient Spontanous Breathing  Post-op Pain: none  Post-op Assessment: Post-op Vital signs reviewed and Patient's Cardiovascular Status Stable              Post-op Vital Signs: Reviewed and stable  Last Vitals:  Filed Vitals:   04/11/15 1707  BP: 98/40  Pulse: 96  Temp: 37 C  Resp: 16    Complications: No apparent anesthesia complications

## 2015-04-11 NOTE — Progress Notes (Signed)
Paged Internal medicine to advise pt was back from procedure and did they want to restart heparin drip.

## 2015-04-11 NOTE — Progress Notes (Signed)
[facility] Progress Note  Assessment/Plan: 1. R chest and neck pain - normal lymphatic tissue seen on FNA by IR and unable to get an excisional biopsy as palpable nodes felt too small. Had R. Supraclavicular lymph node biopsy per Dr. Koleen Nimrod today. Incision stable.  2. MRSE bacteremia/possible Pseudomonas bacteremia: No evidence of SBE on echocardiogram-on vancomycin and Primaxin per recommendations of infectious disease. Continue antibiotics as ordered.  3. ESRD TTS - TTS at [facility]. For HD tomorrow.  4. BP/volume- Last HD 11/12. Net UF 1500. BP controlled. Will attempt UFG 1-1.5 liters tomorrow.  5. Anemia on high-dose ESA with stable hemoglobin-no overt losses, continue to monitor anticipating resistance from infection/inflammation complex. HGB 10. Follow CBC.  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 Chronic coumadin for DVT - currently on levonox for surgical procedure. Will resume coumadin per pharmacy.  8. DM per primary - BS labile - diet is very high in simple carbs- needs complex carbs and protein 9. Malnutrition: Likely from recent infection/chronic illness and possible malignancy given diffuse adenopathy/gastric wall thickening and spiculated lesion in pectoralis muscle-awaiting definitive diagnosis. Continue renal diet/protein supplementation   Rita H. Brown NP-C 04/11/2015, 3:50 PM  [facility] [phone]  Pt seen, examined and agree w A/P as above.  Kelly Splinter MD [facility] pager [phone]    cell [phone] 04/11/2015, 4:42 PM    Subjective:  "I'm feeling pretty good, I just hurt a little". R subclavian incision intact. Denies fevers, chills at present.   Objective Filed Vitals:   04/11/15 1350 04/11/15 1400 04/11/15 1401 04/11/15 1437  BP: 123/64   127/69  Pulse: 94 94  102  Temp:   98.1 F (36.7 C) 98 F (36.7 C)  TempSrc:    Oral  Resp: 13 20  20    Height:      Weight:      SpO2: 97% 100%  100%   Physical Exam General: Chronically ill appearing  Heart: S1, S2, II/VI systolic M. Lungs: Bilateral breath sounds CTA Abdomen: soft, non-tender, active BS Extremities: trace pretibial edema.  Dialysis Access: R thigh AVG + bruit  Dialysis Orders: TueThuSat, 3 hrs 45 min, 160NRe Optiflux, BFR 400, DFR Autoflow 1.5, EDW 66.5 (kg), Dialysate 2.0 K, 2.5 Ca UFR Profile: None, Sodium Model: Linear, Access: AV Graft No Heparin Venofer 50 mg IV weekly Hectoral 1 mcg IV Q TTS   Additional Objective Labs: Basic Metabolic Panel:  Recent Labs Lab 04/07/15 0743 04/09/15 0720 04/10/15 1035  NA 126* 131* 129*  K 3.5 4.0 4.2  CL 89* 96* 93*  CO2 25 26 28   GLUCOSE 122* 130* 171*  BUN 31* 23* 12  CREATININE 7.87* 7.48* 5.14*  CALCIUM 8.1* 8.0* 8.1*  PHOS 3.3 2.6 2.1*   Liver Function Tests:  Recent Labs Lab 04/07/15 0743 04/09/15 0720 04/10/15 1035  ALBUMIN 2.3* 2.3* 2.4*   No results for input(s): LIPASE, AMYLASE in the last 168 hours. CBC:  Recent Labs Lab 04/06/15 0430 04/07/15 0558 04/09/15 0720 04/10/15 1035 04/11/15 1426  WBC 6.8 7.5 7.6 7.9 10.6*  NEUTROABS  --   --   --  5.0  --   HGB 8.8* 9.2* 9.5* 9.6* 10.0*  HCT 29.5* 29.4* 30.8* 32.4* 33.4*  MCV 94.9 94.8 96.9 97.3 97.7  PLT 180 125* 150 161 142*   Blood Culture    Component Value Date/Time   SDES BLOOD RIGHT HAND 04/09/2015 2200  SPECREQUEST  04/09/2015 2200    BOTTLES DRAWN AEROBIC AND ANAEROBIC 10CC BLUE 8CC PURPLE   CULT NO GROWTH 2 DAYS 04/09/2015 2200   REPTSTATUS PENDING 04/09/2015 2200    Cardiac Enzymes: No results for input(s): CKTOTAL, CKMB, CKMBINDEX, TROPONINI in the last 168 hours. CBG:  Recent Labs Lab 04/11/15 0259 04/11/15 0643 04/11/15 0742 04/11/15 1118 04/11/15 1312  GLUCAP 119* 93 98 148* 182*   Iron Studies: No results for input(s): IRON, TIBC, TRANSFERRIN, FERRITIN in the last 72  hours. @lablastinr3 @ Studies/Results: No results found. Medications: . sodium chloride 10 mL/hr at 04/11/15 1111   . calcium acetate  667 mg Oral BID WC  . clonazePAM  1 mg Oral QHS  . darbepoetin (ARANESP) injection - DIALYSIS  200 mcg Intravenous Q Thu-HD  . dextromethorphan-guaiFENesin  1 tablet Oral BID  . dextrose  1 ampule Intravenous Once  . diclofenac sodium  2 g Topical QID  . doxercalciferol  1 mcg Intravenous Q T,Th,Sa-HD  . feeding supplement (NEPRO CARB STEADY)  237 mL Oral BID BM  . gabapentin  100 mg Oral Daily  . guaiFENesin  600 mg Oral BID  . HYDROmorphone      . imipenem-cilastatin  250 mg Intravenous Q12H  . insulin aspart  0-5 Units Subcutaneous TID WC  . insulin aspart  2 Units Subcutaneous TID WC  . insulin glargine  2 Units Subcutaneous Q2200  . insulin glargine  4 Units Subcutaneous q morning - 10a  . lidocaine  1 patch Transdermal Q24H  . metoCLOPramide  10 mg Oral TID AC & HS  . montelukast  10 mg Oral QHS  . multivitamin  1 tablet Oral Daily  . pantoprazole  40 mg Oral QHS  . simvastatin  20 mg Oral QHS  . vancomycin  750 mg Intravenous Q T,Th,Sa-HD

## 2015-04-11 NOTE — Progress Notes (Signed)
Physical Therapy Treatment Patient Details Name: WONNIE FETCHO MRN: [id number] DOB: 1970/12/20 Today's Date: 04/11/2015    History of Present Illness Patient is a 44 y.o female with PMH of HTN, DM type 1 with multiple admissions for DKA, DVT on coumadin, GERD, asthma, ESRD s/p failed renal transplant. Admitted for Diffuse lymphadenopathy.    PT Comments    Pt with mild instability and sleepiness this date most likely due medication received this AM. Anticipate pt will be safe for d/c home with family once medical stable.  Follow Up Recommendations  No PT follow up;Supervision - Intermittent     Equipment Recommendations  None recommended by PT    Recommendations for Other Services       Precautions / Restrictions Precautions Precautions: Fall Restrictions Weight Bearing Restrictions: No    Mobility  Bed Mobility Overal bed mobility: Modified Independent             General bed mobility comments: safe technique  Transfers Overall transfer level: Needs assistance Equipment used: None Transfers: Sit to/from Stand Sit to Stand: Supervision         General transfer comment: pt sleepy from pain medicine, mildly unsteady  Ambulation/Gait Ambulation/Gait assistance: Min guard;Min assist Ambulation Distance (Feet): 300 Feet Assistive device: None Gait Pattern/deviations: Step-to pattern;Staggering left;Staggering right Gait velocity: decreased Gait velocity interpretation: Below normal speed for age/gender General Gait Details: pt mildy unsteady when doing balance testing with occasional staggering left/right   Stairs            Wheelchair Mobility    Modified Rankin (Stroke Patients Only)       Balance                                    Cognition Arousal/Alertness: Awake/alert Behavior During Therapy: WFL for tasks assessed/performed Overall Cognitive Status: Within Functional Limits for tasks assessed                       Exercises      General Comments        Pertinent Vitals/Pain Pain Assessment: No/denies pain    Home Living                      Prior Function            PT Goals (current goals can now be found in the care plan section) Acute Rehab PT Goals Patient Stated Goal: go home Progress towards PT goals: Progressing toward goals    Frequency  Min 2X/week    PT Plan Discharge plan needs to be updated    Co-evaluation             End of Session Equipment Utilized During Treatment: Gait belt Activity Tolerance: Patient tolerated treatment well Patient left: with call bell/phone within reach     Time: 0942-0958 PT Time Calculation (min) (ACUTE ONLY): 16 min  Charges:  $Gait Training: 8-22 mins                    G Codes:      Kingsley Callander 04/11/2015, 10:30 AM   Kittie Plater, PT, DPT Pager #: [phone] Office #: [phone]

## 2015-04-11 NOTE — Progress Notes (Addendum)
ANTICOAGULATION CONSULT NOTE - Follow Up Consult  Pharmacy Consult for Lovenox / Coumadin Indication: DVT  Allergies  Allergen Reactions  . Depakote [Divalproex Sodium] Other (See Comments)    Pt gets "delirious"  . Morphine And Related Nausea And Vomiting and Other (See Comments)    "makes me delirious"  . Penicillins Anaphylaxis    Tolerated Zosyn Dec 2014  . Tramadol Nausea And Vomiting  . Imitrex [Sumatriptan] Other (See Comments)    seizures  . Vicodin [Hydrocodone-Acetaminophen] Itching and Rash     Labs:  Recent Labs  04/09/15 0720 04/10/15 1035 04/10/15 1255 04/10/15 2252  HGB 9.5* 9.6*  --   --   HCT 30.8* 32.4*  --   --   PLT 150 161  --   --   LABPROT 21.5*  --  21.2*  --   INR 1.88*  --  1.85*  --   HEPARINUNFRC  --   --   --  0.15*  CREATININE 7.48* 5.14*  --   --     Estimated Creatinine Clearance: 12.8 mL/min (by C-G formula based on Cr of 5.14).   Assessment: 44 year old female s/p lymph node biopsy to restart anti-coagulation with Lovenox and Coumadin INR today is 1.85  Goal of Therapy:  INR 2-3 Monitor platelets by anticoagulation protocol: Yes   Plan:  Lovenox 70 mg sq Q 24 hours - start 11/15 AM (10 am) Plan to restart Coumadin tomorrow if ok with surgery  Daily INR  Thank you Anette Guarneri, PharmD [phone]    *Received call from Dr. Marlowe Sax asking pharmacy to resume Lovenox and warfarin tonight as it is ok with sx to resume anti-coag. Will restart Lovenox at 70 mg Q24H and give warfarin 7.5 mg x 1 this evening.  Vincenza Hews, PharmD, BCPS 04/11/2015, 4:47 PM Pager: [phone]

## 2015-04-11 NOTE — Progress Notes (Addendum)
Subjective: Patient is doing fine. Denies cp/sob/n/v/fever/chills. States she is still coughing. No other complaints.  Objective: Vital signs in last 24 hours: Filed Vitals:   04/10/15 2054 04/11/15 0457 04/11/15 0500 04/11/15 1005  BP: 120/48 124/59  141/78  Pulse: 93 89  92  Temp: 99.2 F (37.3 C) 98.4 F (36.9 C)  98.6 F (37 C)  TempSrc: Oral Oral  Oral  Resp: 16 18  20   Height:      Weight: 154 lb 5.2 oz (70 kg)  154 lb 5.2 oz (70 kg)   SpO2: 92% 100%  94%   Weight change: 1 lb 8.7 oz (0.7 kg)  Intake/Output Summary (Last 24 hours) at 04/11/15 1140 Last data filed at 04/11/15 1005  Gross per 24 hour  Intake    342 ml  Output      0 ml  Net    342 ml   Physical Exam: Gen: AAOx3, NAD HEENT: bilateral enlarged supraclavicular LN, tender to palpation on right side CVS: RRR, S1 and S2 appreciated, systolic murmur   Lungs: bibasilar rales Abd: soft, NT, ND. +BS.  Ext: no edema Skin: warm and dry.   Lab Results: Basic Metabolic Panel:  Recent Labs Lab 04/09/15 0720 04/10/15 1035  NA 131* 129*  K 4.0 4.2  CL 96* 93*  CO2 26 28  GLUCOSE 130* 171*  BUN 23* 12  CREATININE 7.48* 5.14*  CALCIUM 8.0* 8.1*  PHOS 2.6 2.1*   Liver Function Tests:  Recent Labs Lab 04/09/15 0720 04/10/15 1035  ALBUMIN 2.3* 2.4*   CBC:  Recent Labs Lab 04/09/15 0720 04/10/15 1035  WBC 7.6 7.9  NEUTROABS  --  5.0  HGB 9.5* 9.6*  HCT 30.8* 32.4*  MCV 96.9 97.3  PLT 150 161   CBG:  Recent Labs Lab 04/10/15 1701 04/10/15 2048 04/11/15 0259 04/11/15 0643 04/11/15 0742 04/11/15 1118  GLUCAP 226* 156* 119* 93 98 148*   Coagulation:  Recent Labs Lab 04/06/15 0430 04/07/15 0558 04/09/15 0720 04/10/15 1255  LABPROT 23.5* 24.7* 21.5* 21.2*  INR 2.11* 2.25* 1.88* 1.85*    Micro Results: Recent Results (from the past 240 hour(s))  Culture, blood (routine x 2)     Status: None   Collection Time: 04/01/15  7:50 PM  Result Value Ref Range Status   Specimen  Description BLOOD RIGHT ARM  Final   Special Requests IN PEDIATRIC BOTTLE 1CC  Final   Culture  Setup Time   Final    GRAM POSITIVE COCCI IN CLUSTERS AEROBIC BOTTLE ONLY CRITICAL RESULT CALLED TO, READ BACK BY AND VERIFIED WITH: A RIO 04/02/15 @ 1435 M VESTAL    Culture STAPHYLOCOCCUS SPECIES (COAGULASE NEGATIVE)  Final   Report Status 04/04/2015 FINAL  Final   Organism ID, Bacteria STAPHYLOCOCCUS SPECIES (COAGULASE NEGATIVE)  Final      Susceptibility   Staphylococcus species (coagulase negative) - MIC*    CIPROFLOXACIN >=8 RESISTANT Resistant     ERYTHROMYCIN >=8 RESISTANT Resistant     GENTAMICIN <=0.5 SENSITIVE Sensitive     OXACILLIN >=4 RESISTANT Resistant     TETRACYCLINE >=16 RESISTANT Resistant     VANCOMYCIN 1 SENSITIVE Sensitive     TRIMETH/SULFA 160 RESISTANT Resistant     CLINDAMYCIN <=0.25 SENSITIVE Sensitive     RIFAMPIN <=0.5 SENSITIVE Sensitive     Inducible Clindamycin NEGATIVE Sensitive     * STAPHYLOCOCCUS SPECIES (COAGULASE NEGATIVE)  Culture, blood (routine x 2)     Status: None   Collection  Time: 04/01/15  7:56 PM  Result Value Ref Range Status   Specimen Description BLOOD LEFT HAND  Final   Special Requests IN PEDIATRIC BOTTLE 2CC  Final   Culture  Setup Time   Final    GRAM POSITIVE COCCI IN CLUSTERS AEROBIC BOTTLE ONLY CRITICAL RESULT CALLED TO, READ BACK BY AND VERIFIED WITH: A RIO 04/02/15 @ 53 M VESTAL    Culture   Final    STAPHYLOCOCCUS SPECIES (COAGULASE NEGATIVE) SUSCEPTIBILITIES PERFORMED ON PREVIOUS CULTURE WITHIN THE LAST 5 DAYS.    Report Status 04/04/2015 FINAL  Final  Culture, blood (routine x 2)     Status: None   Collection Time: 04/03/15 11:45 AM  Result Value Ref Range Status   Specimen Description BLOOD RIGHT HAND  Final   Special Requests IN PEDIATRIC BOTTLE 4CC  Final   Culture  Setup Time   Final    GRAM POSITIVE COCCI IN CLUSTERS CRITICAL RESULT CALLED TO, READ BACK BY AND VERIFIED WITH: Louanne Skye 10:25 04/04/15 ([email or web address])      Culture   Final    STAPHYLOCOCCUS SPECIES (COAGULASE NEGATIVE) SUSCEPTIBILITIES PERFORMED ON PREVIOUS CULTURE WITHIN THE LAST 5 DAYS. PSEUDOMONAS FLUORESCENS CRITICAL RESULT CALLED TO, READ BACK BY AND VERIFIED WITH: DR [facility]  04/07/15 @ 1113 M VESTAL    Report Status 04/09/2015 FINAL  Final   Organism ID, Bacteria PSEUDOMONAS FLUORESCENS  Final      Susceptibility   Pseudomonas fluorescens - MIC*    CEFTAZIDIME 16 INTERMEDIATE Intermediate     CIPROFLOXACIN >=4 RESISTANT Resistant     GENTAMICIN <=1 SENSITIVE Sensitive     IMIPENEM 0.5 SENSITIVE Sensitive     * PSEUDOMONAS FLUORESCENS  Culture, blood (routine x 2)     Status: None   Collection Time: 04/03/15  7:35 PM  Result Value Ref Range Status   Specimen Description BLOOD RIGHT ANTECUBITAL  Final   Special Requests BOTTLES DRAWN AEROBIC ONLY 8CC  Final   Culture NO GROWTH 5 DAYS  Final   Report Status 04/08/2015 FINAL  Final  Tissue culture     Status: None   Collection Time: 04/04/15  5:15 PM  Result Value Ref Range Status   Specimen Description TISSUE  Final   Special Requests HEAD NECK LYMPHADENOPATHY  Final   Gram Stain   Final    NO WBC SEEN NO ORGANISMS SEEN Performed at [facility]    Culture   Final    NO GROWTH 3 DAYS Performed at [facility]    Report Status 04/08/2015 FINAL  Final  Culture, blood (routine x 2)     Status: None   Collection Time: 04/05/15 12:22 AM  Result Value Ref Range Status   Specimen Description BLOOD RIGHT HAND  Final   Special Requests IN PEDIATRIC BOTTLE 3CC  Final   Culture  Setup Time   Final    GRAM POSITIVE COCCI IN CHAINS IN PAIRS AEROBIC BOTTLE ONLY CRITICAL RESULT CALLED TO, READ BACK BY AND VERIFIED WITHDwaine Gale RN [id number] 04/05/15 A BROWNING    Culture   Final    VIRIDANS STREPTOCOCCUS STAPHYLOCOCCUS SPECIES (COAGULASE NEGATIVE) SUSCEPTIBILITIES PERFORMED ON PREVIOUS CULTURE WITHIN THE LAST 5 DAYS.    Report Status 04/09/2015 FINAL  Final    Organism ID, Bacteria VIRIDANS STREPTOCOCCUS  Final      Susceptibility   Viridans streptococcus - MIC*    ERYTHROMYCIN >=8 RESISTANT Resistant     TETRACYCLINE >=16 RESISTANT Resistant  VANCOMYCIN 0.5 SENSITIVE Sensitive     CLINDAMYCIN >=1 RESISTANT Resistant     * VIRIDANS STREPTOCOCCUS  Culture, blood (routine x 2)     Status: None   Collection Time: 04/05/15 12:37 AM  Result Value Ref Range Status   Specimen Description BLOOD RIGHT HAND  Final   Special Requests BOTTLES DRAWN AEROBIC AND ANAEROBIC 4CC  Final   Culture NO GROWTH 5 DAYS  Final   Report Status 04/10/2015 FINAL  Final  Culture, blood (routine x 2)     Status: None (Preliminary result)   Collection Time: 04/09/15  9:50 AM  Result Value Ref Range Status   Specimen Description BLOOD GRAFT  Final   Special Requests BOTTLES DRAWN AEROBIC AND ANAEROBIC 10MLS  Final   Culture NO GROWTH 1 DAY  Final   Report Status PENDING  Incomplete  Culture, blood (routine x 2)     Status: None (Preliminary result)   Collection Time: 04/09/15 10:00 PM  Result Value Ref Range Status   Specimen Description BLOOD RIGHT HAND  Final   Special Requests   Final    BOTTLES DRAWN AEROBIC AND ANAEROBIC 10CC BLUE 8CC PURPLE   Culture NO GROWTH < 24 HOURS  Final   Report Status PENDING  Incomplete   Studies/Results: No results found. Medications: I have reviewed the patient's current medications. Scheduled Meds: . [MAR Hold] calcium acetate  667 mg Oral BID WC  . [MAR Hold] clonazePAM  1 mg Oral QHS  . [MAR Hold] darbepoetin (ARANESP) injection - DIALYSIS  200 mcg Intravenous Q Thu-HD  . [MAR Hold] dextromethorphan-guaiFENesin  1 tablet Oral BID  . [MAR Hold] dextrose  1 ampule Intravenous Once  . [MAR Hold] diclofenac sodium  2 g Topical QID  . [MAR Hold] doxercalciferol  1 mcg Intravenous Q T,Th,Sa-HD  . [MAR Hold] feeding supplement (NEPRO CARB STEADY)  237 mL Oral BID BM  . [MAR Hold] gabapentin  100 mg Oral Daily  . [MAR Hold]  guaiFENesin  600 mg Oral BID  . [MAR Hold] imipenem-cilastatin  250 mg Intravenous Q12H  . [MAR Hold] insulin aspart  0-5 Units Subcutaneous TID WC  . [MAR Hold] insulin aspart  2 Units Subcutaneous TID WC  . [MAR Hold] insulin glargine  2 Units Subcutaneous Q2200  . [MAR Hold] insulin glargine  4 Units Subcutaneous q morning - 10a  . [MAR Hold] lidocaine  1 patch Transdermal Q24H  . [MAR Hold] metoCLOPramide  10 mg Oral TID AC & HS  . [MAR Hold] montelukast  10 mg Oral QHS  . [MAR Hold] multivitamin  1 tablet Oral Daily  . [MAR Hold] pantoprazole  40 mg Oral QHS  . [MAR Hold] simvastatin  20 mg Oral QHS  . [MAR Hold] vancomycin  750 mg Intravenous Q T,Th,Sa-HD   Continuous Infusions: . sodium chloride 10 mL/hr at 04/11/15 1111   PRN Meds:.0.9 % irrigation (POUR BTL), [MAR Hold] acetaminophen **OR** [MAR Hold] acetaminophen, [MAR Hold] albuterol, [MAR Hold] dextrose, [MAR Hold]  HYDROmorphone (DILAUDID) injection, [MAR Hold] oxyCODONE, [MAR Hold] promethazine, [MAR Hold] promethazine, [MAR Hold] senna-docusate, [MAR Hold] zolpidem Assessment/Plan: Principal Problem:   Diffuse lymphadenopathy Active Problems:   DM (diabetes mellitus), type 1 with renal complications ([place])   History of simultaneous kidney and pancreas transplant ([place])   End-stage renal disease on hemodialysis ([place])   Chronic diastolic heart failure ([place])   History of DVT of lower extremity   Coag negative Staphylococcus bacteremia   Bacteremia  Diffuse lymphadenopathy:  seen on CT neck and chest concerning for lymphoma/ metastatic disease. Also got CT abdomen which shows small ascites, some cirrhotic changes of liver, diffuse stomach wall thickening concerning for gastritis vs neoplastic involvement (gastric carcinoma vs lymphoma?). HIV and LDH negative. Needle LN biopsy was done by IR showing benign lymph node tissue. However, insufficient cells present for flow cytometry analysis. Patient is scheduled for excisional LN  biopsy by CT surgery today.  - hold coumadin for now. Bridge with heparin.  -Oxy IR 10 q4hr + dilaudid for breakthrough.   -Voltaren gel prn pain  Addendum (04/11/15 at 4:17 pm): LN biopsy procedure is over. Restart Coumadin with Lovenox bridge tonight as per Dr. Leonarda Salon approval.   Fever - Patient has been afebrile for 4 days now. [place] initially growing Strep viridans, coagulase negative staph, and pseudomonas fluorescence. Vancomycin started on 11/5. TEE did not show any vegetations making endocarditis less likely. ID has been consulted and is on board. They believe differential for fever includes infection vs tumor fever if patient does indeed have lymphoma/ mets. Skin was examined and no rashes noted. Vascular surgery was consulted and they do not think her dialysis access sites appear infected. Duplex showing a few small isolated areas of fluid around the grafts but no obvious signs of infection. [place] of abdomen showing trace perihepatic ascites. Panoramic dental x-ray did not show dental abscess or osseous abnormality. Repeat blood culture from 11/8 showing no growth (x2 bottles) in 5 days.  -cont vancomycin with HD for a total of 14 days from the date of 1st negative blood culture (11/8) -cont Imipenem-Cilastatin for pseudomonas coverage. Stop it on discharge.  -tagged WBC scan on Monday (11/14) -Tylenol prn  Cough: Patient's cough is still present but improved. Lung exam today remarkable for bibasilar rales, likely 2/2 atelectasis. Currently not complaining of any SOB. Satting 92-100% on RA. CXR on 11/7 consistent with atelectasis/ mild interstitial edema/ infection. [place] intially growing Strep viridans, coagulase negative staph, and pseudomonas fluorescence, however, no growth in 5 days now.   -Antibiotics as above  -continue Mucinex-DM for congestion  -Albuterol nebulizer prn -Singulair 10 mg daily -continue Incentive spirometer -encourage ambulation (PT on board)  Severe  hyperglycemia with DM Type I - Patient's blood sugars are better controlled now. Most recent 148.   -continue Lantus 4 u in then morning and 2u in the evening -continue Novolog 2 u TID -Modified sensitive-SSI  Pseudohyponatremia - monitor with insulin regimen.  H/o LLE DVT (01/19/2015) - stop coumadin for now. Bridge with heparin.   HFpEF: Stable. TEE showing LVEF >55%, mild MR, moderate TR, and trivial AR. No PR.  -Monitor   ESRD on HD (TTS): New left AVG in 12/2014. HD today.  - appreciate nephro recs.   HTN: BP stable. Continue holding home Amlodipine 10 mg qd, Metoprolol 50 mg qd for now for low BP's.   HLD: continue Zocor 20 mg qd  GERD: Protonix 40 mg qd   Insomnia: continue Ambien qhs    Code: Full  Dispo: Disposition is deferred at this time, awaiting improvement of current medical problems.  Anticipated discharge in approximately 1-2 day(s).   The patient does have a current PCP [name], MD) and does need an [facility] hospital follow-up appointment after discharge.  The patient does not have transportation limitations that hinder transportation to clinic appointments.  .Services Needed at time of discharge: Y = Yes, Blank = No PT:   OT:   RN:   Equipment:  Other:     LOS: 11 days   Shela Leff, MD 04/11/2015, 11:40 AM

## 2015-04-11 NOTE — Interval H&P Note (Signed)
History and Physical Interval Note:  04/11/2015 10:57 AM  [name]  has presented today for surgery, with the diagnosis of /  The various methods of treatment have been discussed with the patient and family. After consideration of risks, benefits and other options for treatment, the patient has consented to  Procedure(s): RIGHT SUPRACLAVICULAR LYMPH NODE BIOPSY (Right) as a surgical intervention .  The patient's history has been reviewed, patient examined, no change in status, stable for surgery.  I have reviewed the patient's chart and labs.  Questions were answered to the patient's satisfaction.     Melrose Nakayama

## 2015-04-11 NOTE — H&P (View-Only) (Signed)
Reason for Consult:[name] Referring Physician: TH  NAEEMA [name] is an 44 y.o. female.  HPI:  [name] [name] is a 44 y.o female with PMH of HTN, type 1 DM, DKA, DVT (on coumadin), GERD, asthma, and ESRD (s/p failed renal transplant now on HD). She presented with a cc/o neck swelling for 2 weeks PTA. She first noted left supraclavicular swelling. It decreased slightly in size, but 2-3 days PTA she noted right supraclavicular swelling. The right side was painful and tender to palpation. She denied fever, chills, night sweats, weight loss, abd pain, n/v, diarrhea. She does complain of decreased appetite and PO intake. She has had fever since admission.  In ED CT showed mild pharyngitis, lymphadenopathy throughout the neck and mediastinal lymphadenopathy,and a spiculated mass like area in R pecotralis major muscle with ill defined margins. A needle biopsy was taken of the right supraclavicular node but was non-diagnostic.  Past Medical History  Diagnosis Date  . Hyperlipidemia   . Alopecia   . Obesity   . Iron deficiency   . RLS (restless legs syndrome)   . Injury of conjunctiva and corneal abrasion of right eye without foreign body   . Cellulitis   . Anemia   . Blood transfusion 2004    "when I had my transplant"  . Migraine   . Hyperparathyroidism, secondary ([place])   . Diabetic retinopathy   . Hypertension     takes Metoprolol and Exforge daily  . Depression     takes Klonopin nightly  . Diabetes mellitus type 1 (HCC)     Pt states diagnosed at age 3 with prior episodes of DKA. S/p pancreatic transplant   . GERD (gastroesophageal reflux disease)     takes Protonix daily  . Bronchitis   . Migraine   . Asthma   . Emphysema of lung ([place])   . Angina     none in past 2 years.  . Stomach pain     Hx: of chronic  . Pneumonia 2012    "double"  . Dialysis patient ([place])     tues,thurs,sat  . 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  Surgery was done at [facility].     . DKA (diabetic ketoacidoses) ([place]) 12/01/2014  . Diastolic CHF, acute on chronic ([place]) 01/02/2015  . Stroke [facility]) 03/2014    ? TIA  . ESRD (end stage renal disease) [facility])     S/p pancreatic and kidney transplant, but back on HD 2008, dialysis at [place], Sat    Past Surgical History  Procedure Laterality Date  . 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 Thyroglobulin      x 7  . Av fistula placement      right upper arm  . Cholecystectomy  1995  .  hd graft placement/removal      "had 2 in my left upper arm"  . Eye surgery    . Retinopathy surgery    . Tooth extraction  10/10/11    X's two  . Insertion of dialysis catheter  12/08/2011    Procedure: INSERTION OF DIALYSIS CATHETER;  Surgeon: Angelia Mould, MD;  Location: [place];  Service: Vascular;  Laterality: Left;  . Av fistula placement  01/22/2012  Procedure: INSERTION OF ARTERIOVENOUS (AV) GORE-TEX GRAFT ARM;  Surgeon: Angelia Mould, MD;  Location: [place];  Service: Vascular;  Laterality: Left;  Ultrasound guided.  . Av fistula placement  03/14/2012    Procedure: INSERTION OF ARTERIOVENOUS (AV) GORE-TEX GRAFT THIGH;  Surgeon: Angelia Mould, MD;  Location: [place];  Service: Vascular;  Laterality: Right;  . False aneurysm repair Right 01/02/2013    Procedure: Excision of lymphocele in right thigh.;  Surgeon: Angelia Mould, MD;  Location: [place];  Service: Vascular;  Laterality: Right;  . Carpal tunnel release Right 03/02/2013    Procedure: CARPAL TUNNEL RELEASE;  Surgeon: Tennis Must, MD;  Location: [place];  Service: Orthopedics;  Laterality: Right;  . Flexible sigmoidoscopy N/A 03/24/2013    Procedure: FLEXIBLE SIGMOIDOSCOPY;  Surgeon:  Cleotis Nipper, MD;  Location: [facility] ENDOSCOPY;  Service: Endoscopy;  Laterality: N/A;  . Revision of arteriovenous goretex graft Right 04/02/2013    Procedure: REPAIR OF PSEUDOANEURYSM OF ARTERIOVENOUS GORETEX GRAFT;  Surgeon: Angelia Mould, MD;  Location: [place];  Service: Vascular;  Laterality: Right;  . Carpal tunnel release Left 10/16/2013    Procedure: LEFT CARPAL TUNNEL RELEASE;  Surgeon: Tennis Must, MD;  Location: [place];  Service: Orthopedics;  Laterality: Left;  . Unilateral upper extremeity angiogram N/A 11/12/2011    Procedure: UNILATERAL UPPER Anselmo Rod;  Surgeon: Angelia Mould, MD;  Location: [facility] CATH LAB;  Service: Cardiovascular;  Laterality: N/A;  . Fistulogram Left 03/03/2012    Procedure: FISTULOGRAM;  Surgeon: Angelia Mould, MD;  Location: [facility] CATH LAB;  Service: Cardiovascular;  Laterality: Left;  . Av fistula placement Left 01/14/2015    Procedure: INSERTION OF LEFT ARTERIOVENOUS (AV) GORE-TEX GRAFT THIGH;  Surgeon: Angelia Mould, MD;  Location: [place];  Service: Vascular;  Laterality: Left;  . Tee without cardioversion N/A 04/05/2015    Procedure: TRANSESOPHAGEAL ECHOCARDIOGRAM (TEE);  Surgeon: Skeet Latch, MD;  Location: [facility] ENDOSCOPY;  Service: Cardiovascular;  Laterality: N/A;    Family History  Problem Relation Age of Onset  . Hypertension Mother   . Kidney disease Mother   . Diabetes Mother   . Hypertension Father   . Kidney disease Father   . Diabetes Father     Social History:  reports that she has never smoked. She has never used smokeless tobacco. She reports that she does not drink alcohol or use illicit drugs.  Allergies:  Allergies  Allergen Reactions  . Depakote [Divalproex Sodium] Other (See Comments)    Pt gets "delirious"  . Morphine And Related Nausea And Vomiting and Other (See Comments)    "makes me delirious"  . Penicillins Anaphylaxis    Tolerated Zosyn Dec 2014  . Tramadol Nausea  And Vomiting  . Imitrex [Sumatriptan] Other (See Comments)    seizures  . Vicodin [Hydrocodone-Acetaminophen] Itching and Rash    Medications:  Scheduled: . calcium acetate  667 mg Oral BID WC  . clonazePAM  1 mg Oral QHS  . darbepoetin (ARANESP) injection - DIALYSIS  200 mcg Intravenous Q Thu-HD  . dextromethorphan-guaiFENesin  1 tablet Oral BID  . dextrose  1 ampule Intravenous Once  . diclofenac sodium  2 g Topical QID  . doxercalciferol  1 mcg Intravenous Q T,[name],Sa-HD  . feeding supplement (NEPRO CARB STEADY)  237 mL Oral BID BM  . gabapentin  100 mg Oral Daily  . guaiFENesin  600 mg Oral BID  . imipenem-cilastatin  250  mg Intravenous Q12H  . insulin aspart  0-5 Units Subcutaneous TID WC  . insulin aspart  2 Units Subcutaneous TID WC  . insulin glargine  2 Units Subcutaneous Q2200  . insulin glargine  4 Units Subcutaneous q morning - 10a  . lidocaine  1 patch Transdermal Q24H  . metoCLOPramide  10 mg Oral TID AC & HS  . montelukast  10 mg Oral QHS  . multivitamin  1 tablet Oral Daily  . pantoprazole  40 mg Oral QHS  . simvastatin  20 mg Oral QHS  . vancomycin  750 mg Intravenous Q T,[name],Sa-HD    Results for orders placed or performed during the hospital encounter of 03/30/15 (from the past 48 hour(s))  Glucose, capillary     Status: None   Collection Time: 04/09/15  2:39 AM  Result Value Ref Range   Glucose-Capillary 88 65 - 99 mg/dL  Glucose, capillary     Status: Abnormal   Collection Time: 04/09/15  6:39 AM  Result Value Ref Range   Glucose-Capillary 131 (H) 65 - 99 mg/dL  CBC     Status: Abnormal   Collection Time: 04/09/15  7:20 AM  Result Value Ref Range   WBC 7.6 4.0 - 10.5 K/uL   RBC 3.18 (L) 3.87 - 5.11 MIL/uL   Hemoglobin 9.5 (L) 12.0 - 15.0 g/dL   HCT 30.8 (L) 36.0 - 46.0 %   MCV 96.9 78.0 - 100.0 fL   MCH 29.9 26.0 - 34.0 pg   MCHC 30.8 30.0 - 36.0 g/dL   RDW 17.9 (H) 11.5 - 15.5 %   Platelets 150 150 - 400 K/uL  Protime-INR     Status: Abnormal    Collection Time: 04/09/15  7:20 AM  Result Value Ref Range   Prothrombin Time 21.5 (H) 11.6 - 15.2 seconds   INR 1.88 (H) 0.00 - 1.49  Renal function panel     Status: Abnormal   Collection Time: 04/09/15  7:20 AM  Result Value Ref Range   Sodium 131 (L) 135 - 145 mmol/L   Potassium 4.0 3.5 - 5.1 mmol/L   Chloride 96 (L) 101 - 111 mmol/L   CO2 26 22 - 32 mmol/L   Glucose, Bld 130 (H) 65 - 99 mg/dL   BUN 23 (H) 6 - 20 mg/dL   Creatinine, Ser 7.48 (H) 0.44 - 1.00 mg/dL   Calcium 8.0 (L) 8.9 - 10.3 mg/dL   Phosphorus 2.6 2.5 - 4.6 mg/dL   Albumin 2.3 (L) 3.5 - 5.0 g/dL   GFR calc non Af Amer 6 (L) >60 mL/min   GFR calc Af Amer 7 (L) >60 mL/min    Comment: (NOTE) The eGFR has been calculated using the CKD EPI equation. This calculation has not been validated in all clinical situations. eGFR's persistently <60 mL/min signify possible Chronic Kidney Disease.    Anion gap 9 5 - 15  Culture, blood (routine x 2)     Status: None (Preliminary result)   Collection Time: 04/09/15  9:50 AM  Result Value Ref Range   Specimen Description BLOOD GRAFT    Special Requests BOTTLES DRAWN AEROBIC AND ANAEROBIC 10MLS    Culture NO GROWTH 1 DAY    Report Status PENDING   Glucose, capillary     Status: Abnormal   Collection Time: 04/09/15 11:38 AM  Result Value Ref Range   Glucose-Capillary 162 (H) 65 - 99 mg/dL  Glucose, capillary     Status: Abnormal   Collection Time:  04/09/15  5:00 PM  Result Value Ref Range   Glucose-Capillary 329 (H) 65 - 99 mg/dL  Glucose, capillary     Status: Abnormal   Collection Time: 04/09/15  8:48 PM  Result Value Ref Range   Glucose-Capillary 148 (H) 65 - 99 mg/dL  Culture, blood (routine x 2)     Status: None (Preliminary result)   Collection Time: 04/09/15 10:00 PM  Result Value Ref Range   Specimen Description BLOOD RIGHT HAND    Special Requests      BOTTLES DRAWN AEROBIC AND ANAEROBIC 10CC BLUE 8CC PURPLE   Culture NO GROWTH < 24 HOURS    Report Status  PENDING   Glucose, capillary     Status: Abnormal   Collection Time: 04/09/15 11:42 PM  Result Value Ref Range   Glucose-Capillary 170 (H) 65 - 99 mg/dL  Glucose, capillary     Status: None   Collection Time: 04/10/15  2:24 AM  Result Value Ref Range   Glucose-Capillary 67 65 - 99 mg/dL  Glucose, capillary     Status: None   Collection Time: 04/10/15  4:04 AM  Result Value Ref Range   Glucose-Capillary 98 65 - 99 mg/dL  Glucose, capillary     Status: Abnormal   Collection Time: 04/10/15  7:02 AM  Result Value Ref Range   Glucose-Capillary 106 (H) 65 - 99 mg/dL  Glucose, capillary     Status: None   Collection Time: 04/10/15  8:34 AM  Result Value Ref Range   Glucose-Capillary 97 65 - 99 mg/dL  CBC with Differential/Platelet     Status: Abnormal   Collection Time: 04/10/15 10:35 AM  Result Value Ref Range   WBC 7.9 4.0 - 10.5 K/uL   RBC 3.33 (L) 3.87 - 5.11 MIL/uL   Hemoglobin 9.6 (L) 12.0 - 15.0 g/dL   HCT 32.4 (L) 36.0 - 46.0 %   MCV 97.3 78.0 - 100.0 fL   MCH 28.8 26.0 - 34.0 pg   MCHC 29.6 (L) 30.0 - 36.0 g/dL   RDW 18.6 (H) 11.5 - 15.5 %   Platelets 161 150 - 400 K/uL   Neutrophils Relative % 64 %   Neutro Abs 5.0 1.7 - 7.7 K/uL   Lymphocytes Relative 26 %   Lymphs Abs 2.1 0.7 - 4.0 K/uL   Monocytes Relative 6 %   Monocytes Absolute 0.5 0.1 - 1.0 K/uL   Eosinophils Relative 4 %   Eosinophils Absolute 0.3 0.0 - 0.7 K/uL   Basophils Relative 0 %   Basophils Absolute 0.0 0.0 - 0.1 K/uL  Renal function panel     Status: Abnormal   Collection Time: 04/10/15 10:35 AM  Result Value Ref Range   Sodium 129 (L) 135 - 145 mmol/L   Potassium 4.2 3.5 - 5.1 mmol/L   Chloride 93 (L) 101 - 111 mmol/L   CO2 28 22 - 32 mmol/L   Glucose, Bld 171 (H) 65 - 99 mg/dL   BUN 12 6 - 20 mg/dL   Creatinine, Ser 5.14 (H) 0.44 - 1.00 mg/dL   Calcium 8.1 (L) 8.9 - 10.3 mg/dL   Phosphorus 2.1 (L) 2.5 - 4.6 mg/dL   Albumin 2.4 (L) 3.5 - 5.0 g/dL   GFR calc non Af Amer 9 (L) >60 mL/min    GFR calc Af Amer 11 (L) >60 mL/min    Comment: (NOTE) The eGFR has been calculated using the CKD EPI equation. This calculation has not been validated in all clinical  situations. eGFR's persistently <60 mL/min signify possible Chronic Kidney Disease.    Anion gap 8 5 - 15  Glucose, capillary     Status: Abnormal   Collection Time: 04/10/15 12:30 PM  Result Value Ref Range   Glucose-Capillary 235 (H) 65 - 99 mg/dL  Protime-INR     Status: Abnormal   Collection Time: 04/10/15 12:55 PM  Result Value Ref Range   Prothrombin Time 21.2 (H) 11.6 - 15.2 seconds   INR 1.85 (H) 0.00 - 1.49  Glucose, capillary     Status: Abnormal   Collection Time: 04/10/15  5:01 PM  Result Value Ref Range   Glucose-Capillary 226 (H) 65 - 99 mg/dL    No results found.  Review of Systems  Constitutional: Positive for fever and malaise/fatigue. Negative for chills and weight loss.  Respiratory: Negative for shortness of breath and wheezing.   Cardiovascular: Negative for chest pain.  Musculoskeletal: Positive for neck pain.  Neurological: Negative for dizziness and loss of consciousness.  Endo/Heme/Allergies: Bruises/bleeds easily.   Blood pressure 110/46, pulse 93, temperature 98.2 F (36.8 C), temperature source Oral, resp. rate 16, height '5\' 1"'  (1.549 m), weight 151 lb 3.8 oz (68.6 kg), last menstrual period 03/02/2015, SpO2 94 %, unknown if currently breastfeeding. Physical Exam  Vitals reviewed. Constitutional: She is oriented to person, place, and time. No distress.  Appears older than stated age.  HENT:  Head: Normocephalic and atraumatic.  Eyes: EOM are normal. No scleral icterus.  Neck: Neck supple.  Cardiovascular: Normal rate and regular rhythm.   Respiratory: Breath sounds normal. She has no wheezes.  GI: Soft. There is no tenderness.  Lymphadenopathy:    She has cervical [name] (tender to palpation).  Neurological: She is alert and oriented to person, place, and time. No cranial  nerve deficit. She exhibits normal muscle tone.  Skin: Skin is warm and dry.    Assessment/Plan: 44 yo woman with multiple medical problems who presents with [name]. The most obvious concern is lymphoma. A core needle biopsy was negative and she needs a lymph node biopsy for diagnostic purposes.   I offered to do a right supraclavicular lymph node biopsy. I informed her of the indications, risks, benefits and alternatives. She is aware of the general nature of the procedure. It will be done under local with IV sedation. She is aware the risks include but are not limited bleeding, infection, failure to make a diagnosis, and lymphocele. She agrees to proceed.  Plan surgery tomorrow  Her INR was 1.8 today. Holding coumadin tonight should be adequate.  Melrose Nakayama 04/10/2015, 7:15 PM

## 2015-04-11 NOTE — Anesthesia Preprocedure Evaluation (Addendum)
Anesthesia Evaluation  Patient identified by MRN, date of birth, ID band Patient awake    Reviewed: Allergy & Precautions, NPO status , Patient's Chart, lab work & pertinent test results, reviewed documented beta blocker date and time   Airway Mallampati: II  TM Distance: >3 FB Neck ROM: Full    Dental  (+) Poor Dentition,    Pulmonary asthma , COPD,    breath sounds clear to auscultation       Cardiovascular hypertension, Pt. on medications and Pt. on home beta blockers + Peripheral Vascular Disease and +CHF   Rhythm:Regular Rate:Normal     Neuro/Psych  Headaches, PSYCHIATRIC DISORDERS Depression CVA    GI/Hepatic GERD  ,  Endo/Other  diabetes, Type 1, Insulin Dependent  Renal/GU ESRF and DialysisRenal disease  negative genitourinary   Musculoskeletal   Abdominal   Peds negative pediatric ROS (+)  Hematology   Anesthesia Other Findings S/p Kidney-Panc transplant  Reproductive/Obstetrics                          Lab Results  Component Value Date   WBC 7.9 04/10/2015   HGB 9.6* 04/10/2015   HCT 32.4* 04/10/2015   MCV 97.3 04/10/2015   PLT 161 04/10/2015   Lab Results  Component Value Date   CREATININE 5.14* 04/10/2015   BUN 12 04/10/2015   NA 129* 04/10/2015   K 4.2 04/10/2015   CL 93* 04/10/2015   CO2 28 04/10/2015   Lab Results  Component Value Date   INR 1.85* 04/10/2015   INR 1.88* 04/09/2015   INR 2.25* 04/07/2015   EKG: normal sinus rhythm.    Anesthesia Physical Anesthesia Plan  ASA: III  Anesthesia Plan: MAC   Post-op Pain Management:    Induction: Intravenous  Airway Management Planned: Natural Airway  Additional Equipment:   Intra-op Plan:   Post-operative Plan: Extubation in OR  Informed Consent: I have reviewed the patients History and Physical, chart, labs and discussed the procedure including the risks, benefits and alternatives for the proposed  anesthesia with the patient or authorized representative who has indicated his/her understanding and acceptance.   Dental advisory given  Plan Discussed with: CRNA  Anesthesia Plan Comments:        Anesthesia Quick Evaluation

## 2015-04-12 ENCOUNTER — Encounter (HOSPITAL_COMMUNITY): Payer: Self-pay | Admitting: Thoracic Surgery (Cardiothoracic Vascular Surgery)

## 2015-04-12 LAB — RENAL FUNCTION PANEL
ALBUMIN: 2.4 g/dL — AB (ref 3.5–5.0)
ANION GAP: 15 (ref 5–15)
BUN: 30 mg/dL — AB (ref 6–20)
CALCIUM: 8.6 mg/dL — AB (ref 8.9–10.3)
CO2: 22 mmol/L (ref 22–32)
Chloride: 91 mmol/L — ABNORMAL LOW (ref 101–111)
Creatinine, Ser: 8.68 mg/dL — ABNORMAL HIGH (ref 0.44–1.00)
GFR calc Af Amer: 6 mL/min — ABNORMAL LOW (ref >60)
GFR calc non Af Amer: 5 mL/min — ABNORMAL LOW (ref >60)
GLUCOSE: 296 mg/dL — AB (ref 65–99)
PHOSPHORUS: 4.3 mg/dL (ref 2.5–4.6)
Potassium: 5.5 mmol/L — ABNORMAL HIGH (ref 3.5–5.1)
SODIUM: 128 mmol/L — AB (ref 135–145)

## 2015-04-12 LAB — CBC
HEMATOCRIT: 31.4 % — AB (ref 36.0–46.0)
Hemoglobin: 9.3 g/dL — ABNORMAL LOW (ref 12.0–15.0)
MCH: 29.1 pg (ref 26.0–34.0)
MCHC: 29.6 g/dL — AB (ref 30.0–36.0)
MCV: 98.1 fL (ref 78.0–100.0)
PLATELETS: 135 10*3/uL — AB (ref 150–400)
RBC: 3.2 MIL/uL — ABNORMAL LOW (ref 3.87–5.11)
RDW: 19.1 % — AB (ref 11.5–15.5)
WBC: 9.5 10*3/uL (ref 4.0–10.5)

## 2015-04-12 LAB — GLUCOSE, CAPILLARY
GLUCOSE-CAPILLARY: 270 mg/dL — AB (ref 65–99)
Glucose-Capillary: 208 mg/dL — ABNORMAL HIGH (ref 65–99)

## 2015-04-12 LAB — PROTIME-INR
INR: 1.99 — AB (ref 0.00–1.49)
PROTHROMBIN TIME: 22.5 s — AB (ref 11.6–15.2)

## 2015-04-12 IMAGING — CR DG CHEST 2V
2 series · 2 of 2 positions shown · non-contrast
Comparison: 03/16/2013.

CLINICAL DATA: Fever. Short of breath. Chest pain. Abdominal pain.
Cough. Congestion.

EXAM:
CHEST  2 VIEW

[w chest pa]
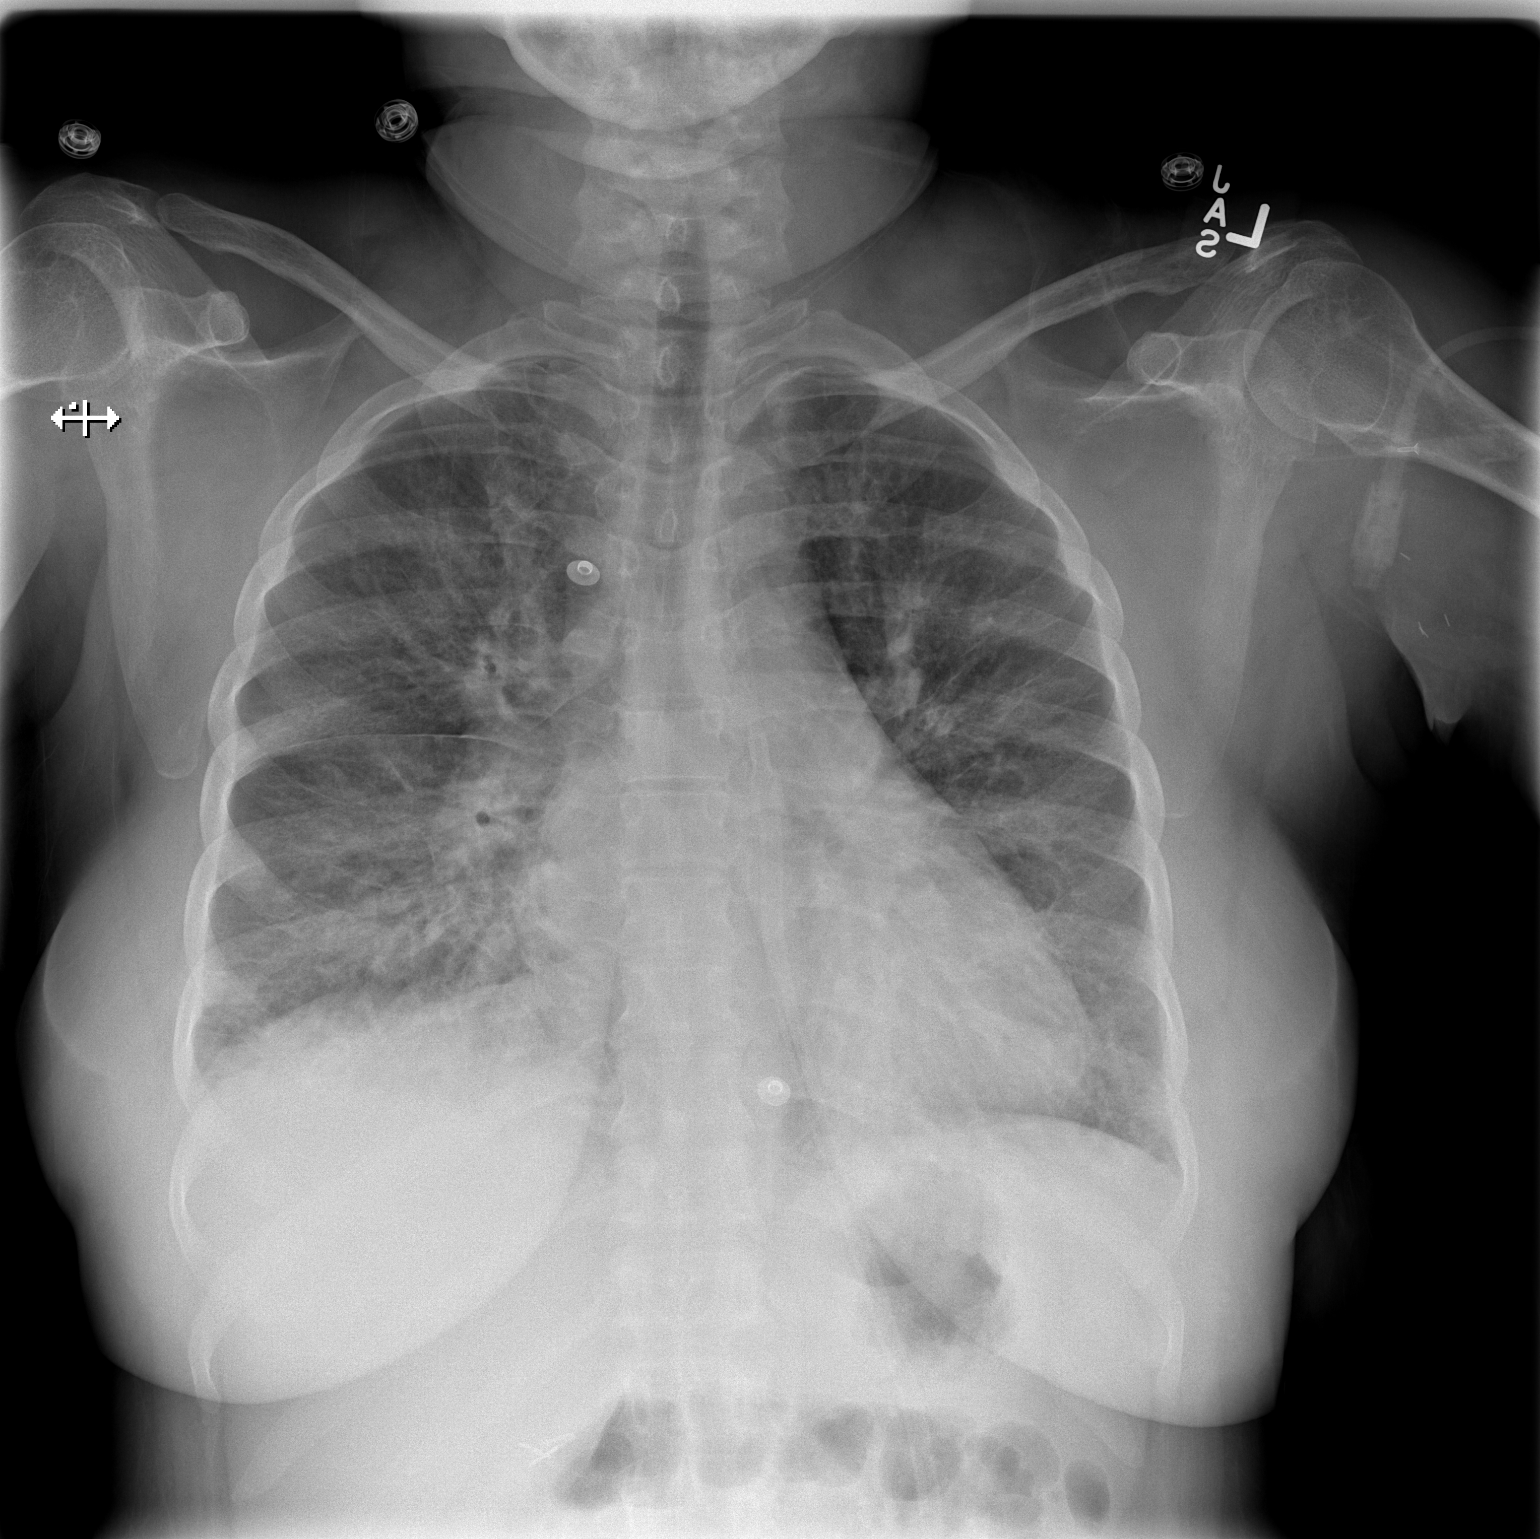

[w chest lat]
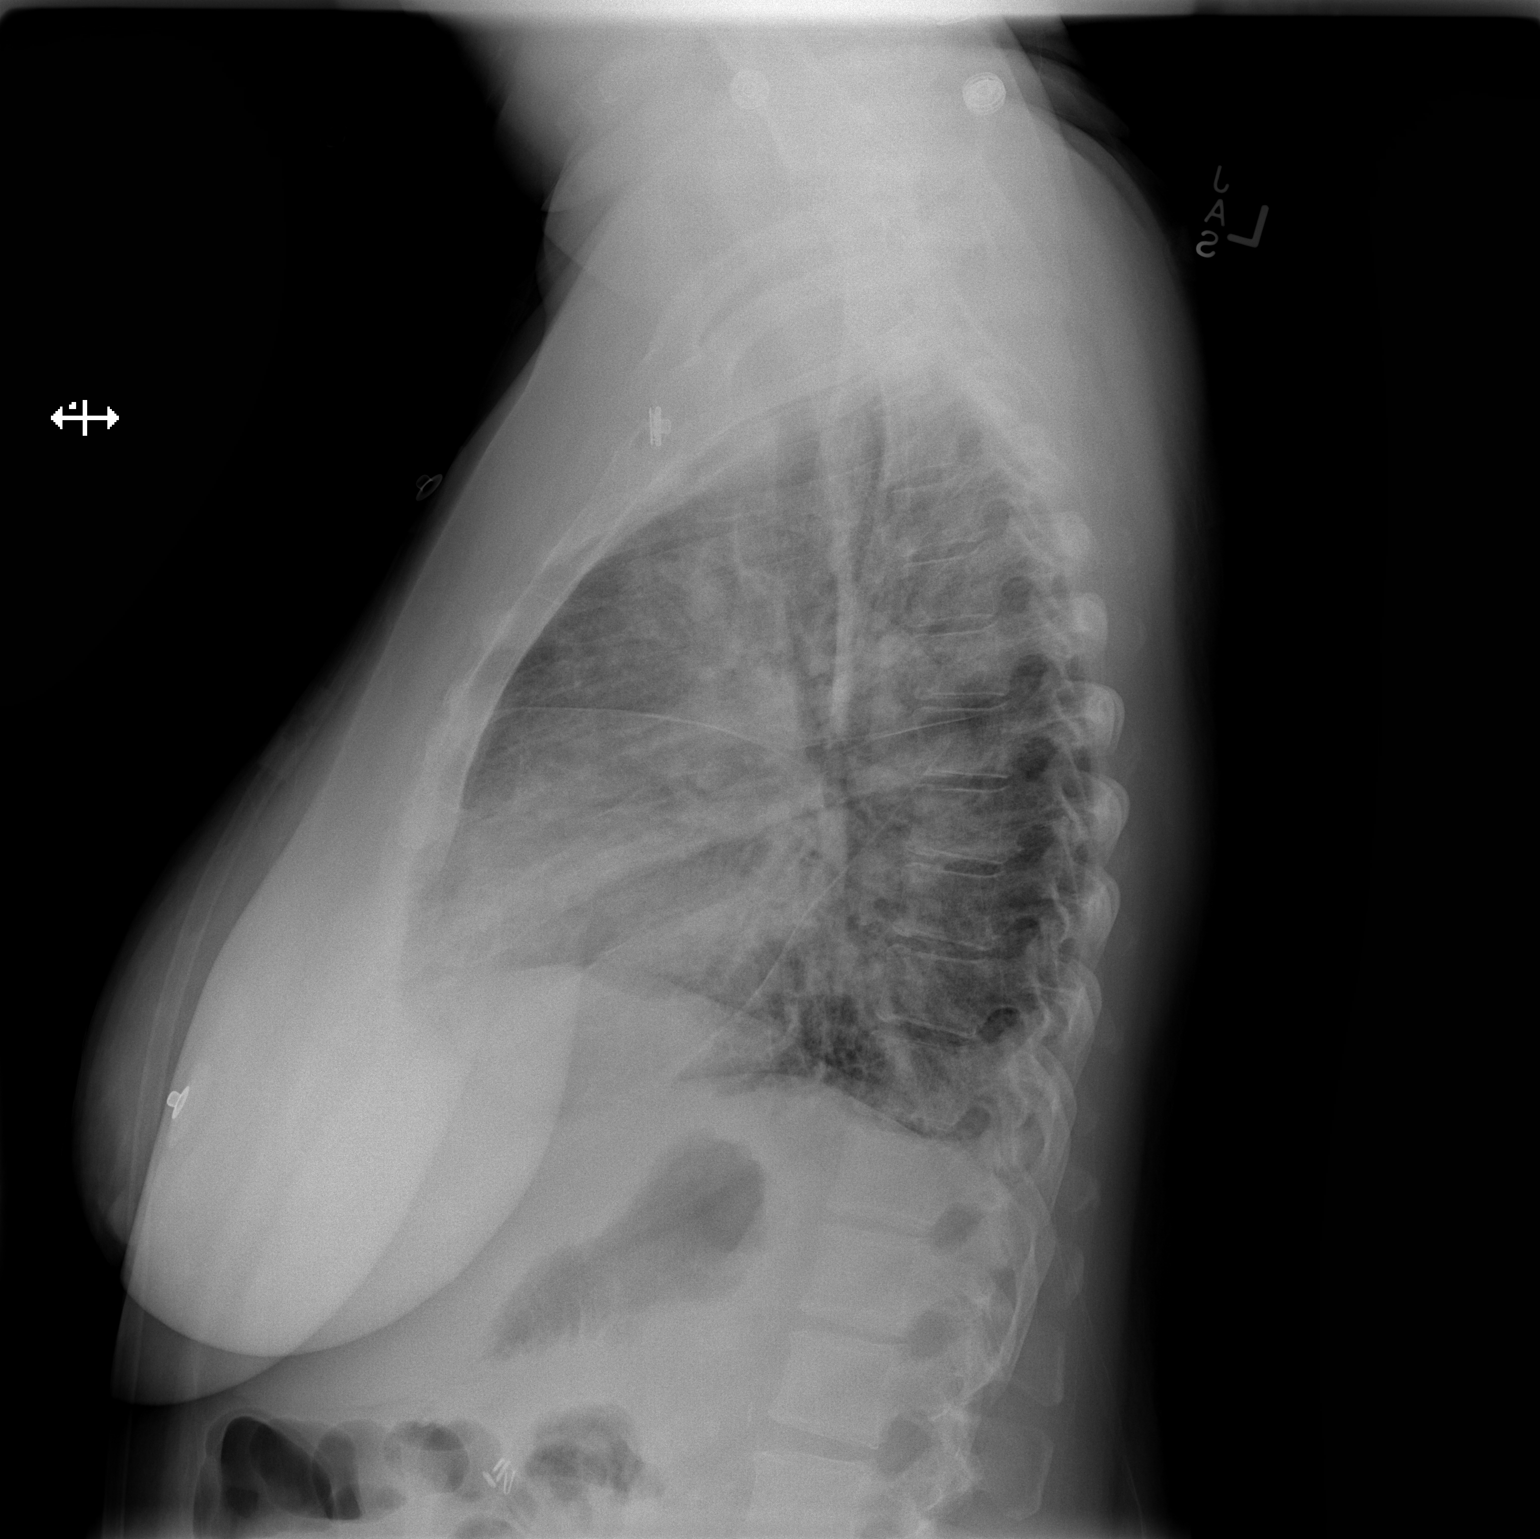

[2 of 2 positions shown; findings below may reference images not displayed]

FINDINGS: Increasing bilateral interstitial and airspace opacity compared to
the prior exam. The cardiopericardial silhouette is mildly enlarged
for projection. Mediastinal contours appear within normal limits.
Thickening of the fissures is present on the lateral view. The
overall constellation of findings is most compatible with CHF/volume
overload with interstitial and alveolar pulmonary edema.
Superimposed pneumonia is difficult to exclude but not favored.
There is no gross pleural fusion. Cholecystectomy clips are present
in the right upper quadrant.
IMPRESSION: Constellation of findings most compatible with moderate CHF/volume
overload.

## 2015-04-12 MED ORDER — WARFARIN SODIUM 5 MG PO TABS
5.0000 mg | ORAL_TABLET | Freq: Once | ORAL | Status: DC
  Filled 2015-04-12: qty 1

## 2015-04-12 MED ORDER — DOXERCALCIFEROL 4 MCG/2ML IV SOLN
INTRAVENOUS | Status: AC
  Filled 2015-04-12: qty 2

## 2015-04-12 MED ORDER — VANCOMYCIN HCL IN DEXTROSE 750-5 MG/150ML-% IV SOLN: 750.0000 mg | INTRAVENOUS | Status: AC

## 2015-04-12 MED ORDER — DARBEPOETIN ALFA 200 MCG/0.4ML IJ SOSY
PREFILLED_SYRINGE | INTRAMUSCULAR | Status: AC
  Filled 2015-04-12: qty 0.4

## 2015-04-12 NOTE — Progress Notes (Signed)
Patient seen and examined. Case d/w residents in detail. I agree with findings and plan as documented in Dr. Elon Jester note.  Patient feels well. No fevers. Latest blood cx remain negative.Complete 14 day course of IV vancomycin at HD. Imipenem dc'd. Patient stable for d/c home today. Dr. Marlowe Sax will f/u path results from excisional biopsy and inform patient of results.

## 2015-04-12 NOTE — Op Note (Signed)
[name], [name] NO.:  [id number]  MEDICAL RECORD NO.:  [id number]  LOCATION:  [id number]                        FACILITY:  [facility]  PHYSICIAN:  Revonda Standard. Roxan Hockey, M.D.DATE OF BIRTH:  12-Apr-1971  DATE OF PROCEDURE:  04/11/2015 DATE OF DISCHARGE:                              OPERATIVE REPORT   PREOPERATIVE DIAGNOSIS:  Diffuse adenopathy.  POSTOPERATIVE DIAGNOSIS:  Diffuse adenopathy.  PROCEDURE:  Right supraclavicular lymph node biopsies.  SURGEON:  Revonda Standard. Roxan Hockey, M.D.  ASSISTANT:  None.  ANESTHESIA:  Local with intravenous sedation.  FINDINGS:  Markedly enlarged supraclavicular lymph nodes.  Touch prep was nondiagnostic.  CLINICAL NOTE:  [name] is a 44 year old woman with multiple medical problems, who presented with neck pain.  She was found to have cervical and supraclavicular adenopathy, as well as diffuse adenopathy elsewhere. A needle biopsy of the lymph node in the right neck was nondiagnostic. She now was advised to undergo excisional lymph node biopsy for more definitive diagnosis to guide further treatment.  The indications, risks, benefits, and alternatives were discussed in detail with the patient.  She understood and accepted the risks and agreed to proceed.  OPERATIVE NOTE:  [name] was brought to the operating room on April 11, 2015.  She was given intravenous sedation.  She was already on vancomycin and imipenem.  No additional antibiotics were given.  The neck and upper chest were prepped and draped in the usual sterile fashion.  After ensuring adequate intravenous sedation, 1% lidocaine with epinephrine was used to achieve local anesthesia in the right supraclavicular fossa.  An incision was made through the skin and subcutaneous tissue.  The external jugular vein was identified.  It was preserved.  There was some very fibrotic tissue around the external jugular.  This was removed and the underlying superficial  scalene fat pad was dissected out.  There was no definite nodal tissue in this.  The dissection was carried deeper.  A small lymph node was identified, this was removed and sent for touch prep, which came back nondiagnostic.  Dissection was carried deeper.  A large mass of nodes was identified.  Individual nodes were taken as well, but this large mass was taken en bloc. It was sent for permanent pathology.  The wound was copiously irrigated with saline.  Hemostasis was achieved.  The platysmal layer was closed with a running 3-0 Vicryl suture and the skin was closed with a 4-0 Vicryl subcuticular suture.  Dermabond was applied.  The patient was taken to the postanesthetic care unit in good condition.     Revonda Standard Roxan Hockey, M.D.     SCH/MEDQ  D:  04/11/2015  T:  04/11/2015  Job:  [id number]

## 2015-04-12 NOTE — Discharge Summary (Signed)
Name: [name] MRN: [id number] DOB: 16-May-1971 44 y.o. PCP: [name], MD  Date of Admission: 03/30/2015  9:49 PM Date of Discharge: 04/15/2015 Attending Physician: No att. providers found  Discharge Diagnosis:  Principal Problem:   Diffuse lymphadenopathy Active Problems:   DM (diabetes mellitus), type 1 with renal complications ([place])   History of simultaneous kidney and pancreas transplant ([place])   End-stage renal disease on hemodialysis ([place])   Chronic diastolic heart failure ([place])   History of DVT of lower extremity   Coag negative Staphylococcus bacteremia   Bacteremia  Discharge Medications:   Medication List    TAKE these medications        albuterol 108 (90 BASE) MCG/ACT inhaler  Commonly known as:  PROVENTIL HFA;VENTOLIN HFA  Inhale 2 puffs into the lungs every 4 (four) hours as needed for wheezing or shortness of breath (as per home regimen).     amLODipine 10 MG tablet  Commonly known as:  NORVASC  Take 10 mg by mouth at bedtime.     aspirin 325 MG tablet  Take 1 tablet (325 mg total) by mouth daily.     calcium acetate 667 MG capsule  Commonly known as:  PHOSLO  Take 1 capsule (667 mg total) by mouth 2 (two) times daily.     clonazePAM 0.5 MG tablet  Commonly known as:  KLONOPIN  Take 1 mg by mouth at bedtime.     Darbepoetin Alfa 40 MCG/0.4ML Sosy injection  Commonly known as:  ARANESP  Inject 0.4 mLs (40 mcg total) into the vein every Thursday with hemodialysis.     doxercalciferol 4 MCG/2ML injection  Commonly known as:  HECTOROL  Inject 1 mL (2 mcg total) into the vein Every Tuesday,Thursday,and Saturday with dialysis.     feeding supplement (NEPRO CARB STEADY) Liqd  Take 237 mLs by mouth 2 (two) times daily between meals.     gabapentin 100 MG capsule  Commonly known as:  NEURONTIN  Take 100 mg by mouth daily.     guaiFENesin 600 MG 12 hr tablet  Commonly known as:  MUCINEX  Take 1 tablet (600 mg total) by mouth 2  (two) times daily.     HUMALOG KWIKPEN 100 UNIT/ML KiwkPen  Generic drug:  insulin lispro  Inject 6 Units into the skin 2 (two) times daily.     insulin glargine 100 UNIT/ML injection  Commonly known as:  LANTUS  Inject 0.07 mLs (7 Units total) into the skin 2 (two) times daily.     lidocaine 5 %  Commonly known as:  LIDODERM  Place 1 patch onto the skin daily. Remove & Discard patch within 12 hours or as directed by MD     metoCLOPramide 10 MG tablet  Commonly known as:  REGLAN  Take 1 tablet (10 mg total) by mouth 4 (four) times daily -  before meals and at bedtime.     metoprolol succinate 50 MG 24 hr tablet  Commonly known as:  TOPROL-XL  Take 1 tablet (50 mg total) by mouth daily. Take with or immediately following a meal.     montelukast 10 MG tablet  Commonly known as:  SINGULAIR  Take 10 mg by mouth at bedtime.     multivitamin Tabs tablet  Take 1 tablet by mouth daily.     Oxycodone HCl 10 MG Tabs  Take 10 mg by mouth 3 (three) times daily as needed (for pain).     oxyCODONE-acetaminophen 5-325 MG  tablet  Commonly known as:  PERCOCET/ROXICET  Take 1-2 tablets by mouth every 6 (six) hours as needed for severe pain.     pantoprazole 40 MG tablet  Commonly known as:  PROTONIX  Take 1 tablet (40 mg total) by mouth at bedtime.     promethazine 25 MG tablet  Commonly known as:  PHENERGAN  Take 1 tablet (25 mg total) by mouth every 6 (six) hours as needed for nausea or vomiting.     simvastatin 20 MG tablet  Commonly known as:  ZOCOR  Take 1 tablet (20 mg total) by mouth at bedtime.     Vancomycin 750 MG/150ML Soln  Commonly known as:  VANCOCIN  Inject 150 mLs (750 mg total) into the vein Every Tuesday,Thursday,and Saturday with dialysis.     warfarin 5 MG tablet  Commonly known as:  COUMADIN  Take 0.5 tablets (2.5 mg total) by mouth daily at 6 PM.     zolpidem 5 MG tablet  Commonly known as:  AMBIEN  Take 5 mg by mouth at bedtime as needed. sleep         Disposition and follow-up:   [name] was discharged from [facility] in Stable condition.  At the hospital follow up visit please address:  1.  Diffuse lymphadenopathy, spiculated pectoral mass, and pulmonary nodule - Please consider ordering an MRI to better visualize the spiculated pectoral mass. In addition, consider consulting interventional radiology for biopsy of the pectoral mass. For the pulmonary nodule, do repeat chest CT scan in 6-12 months. In addition, consider GI referral for possible EGD + biopsy of the area of gastric wall thickening.   Fluctuating blood glucose values - please discuss diabetes medication regimen with patient.   Fever - any more fevers?  Cough - any more cough?  2.  Labs / imaging needed at time of follow-up: MRI of chest, CBC, BMP, INR (patent is on coumadin)   3.  Pending labs/ test needing follow-up:   Follow-up Appointments:     Follow-up Information    Follow up with Elenora Gamma, MD. Go on 04/18/2015.   Specialty:  Student   Why:  Follow up appointment at 4:15 pm -- you will be seeing the PA for a PT/INR check.  We are also sending a discharge summary to the office.   Contact information:   [address] [place] [address] [phone]       Discharge Instructions: Discharge Instructions    Diet - low sodium heart healthy    Complete by:  As directed      Increase activity slowly    Complete by:  As directed            Consultations: Treatment Team:  Elmarie Shiley, MD Angelia Mould, MD Arta Silence, MD Melrose Nakayama, MD  Procedures Performed:  Dg Orthopantogram  04/07/2015  CLINICAL DATA:  44 year old with 1 week history of fever of unknown origin. Poor dentition. Evaluate for dental abscess. EXAM: ORTHOPANTOGRAM/PANORAMIC COMPARISON:  None. FINDINGS: No periapical lucencies involving the remaining teeth to suggest abscess. Temporomandibular joints intact. No  intrinsic osseous abnormality involving the mandible or maxilla. IMPRESSION: No evidence of dental abscess. Electronically Signed   By: Evangeline Dakin M.D.   On: 04/07/2015 18:13   Dg Chest 2 View  04/04/2015  CLINICAL DATA:  Cough, weakness, and fever for a few days. EXAM: CHEST  2 VIEW COMPARISON:  04/02/2015 FINDINGS: The cardiac silhouette is mildly  enlarged, unchanged. There is persistent mild elevation of the right hemidiaphragm. The patient has taken a greater inspiration than on the prior study. There is slightly improved aeration of the lung bases with persistent mild basilar opacities and mild interstitial prominence bilaterally. No pneumothorax or pleural effusion is seen. Right upper quadrant abdominal surgical clips are noted. No acute osseous abnormality is identified. IMPRESSION: Greater inspiration with improved aeration of the lung bases. Residual mild bibasilar opacities may reflect atelectasis, mild interstitial edema, or infection. Electronically Signed   By: [name] M.D.   On: 04/04/2015 09:26   Dg Chest 2 View  04/02/2015  CLINICAL DATA:  44 year old female with a history of chest pain EXAM: CHEST - 2 VIEW COMPARISON:  CT 03/31/2015, chest x-ray 01/05/2015 FINDINGS: Cardiomediastinal silhouette unchanged. Low lung volumes with interstitial opacities bilaterally and patchy airspace opacities. No pneumothorax.  No pleural effusion. No displaced fracture. Unremarkable appearance of the upper abdomen. IMPRESSION: Interstitial opacities and patchy airspace opacities, potentially reflecting developing edema, consolidation, or atelectasis given the low lung volumes. Signed, [name]. [name], DO [facility] Radiology Electronically Signed   By: Corrie Mckusick D.O.   On: 04/02/2015 14:49   Ct Soft Tissue Neck W Contrast  03/31/2015  CLINICAL DATA:  LEFT neck swelling for 2 weeks, 2 days of RIGHT neck swelling. Pain. History of diabetes,  end-stage renal disease on dialysis, status post kidney pancreatic transplant. EXAM: CT NECK WITH CONTRAST TECHNIQUE: Multidetector CT imaging of the neck was performed using the standard protocol following the bolus administration of intravenous contrast. CONTRAST:  51mL OMNIPAQUE IOHEXOL 300 MG/ML  SOLN COMPARISON:  None. FINDINGS: Pharynx and larynx: Prominent uvula partially effacing the airway, palatine tonsils are not enlarged or inflamed. Small retropharyngeal effusion without discrete abscess. Mild hypo pharyngeal edema. No abscess. Larynx is normal. Salivary glands: Normal. Thyroid: Normal. Lymph nodes: Lymphadenopathy throughout the neck, including round LEFT supraclavicular 13 mm short axis lymph node, 9 mm short axis RIGHT paratracheal lymph node. Rounded RIGHT level IIa 10 mm lymph node. Vascular: Moderate calcific atherosclerosis of the carotid bulbs, advanced for age. 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 Enlarged patent bilateral external jugular veins. Limited intracranial: Normal. Visualized orbits: Normal. Mastoids and visualized paranasal sinuses: Well aerated. 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 Multiple absent teeth. Upper chest: Pulmonary vascular congestion, suspected cardiomegaly. Partially imaged mediastinal lymphadenopathy. IMPRESSION: Mild pharyngitis. Prominent uvula partially effaces the airway. Small retropharyngeal effusion without abscess. Mild lymphadenopathy throughout the neck, partially imaged mediastinal lymphadenopathy for which dedicated CT of the chest with contrast is recommended. Partially imaged cardiomegaly and pulmonary vascular congestion. Chronically occluded bilateral internal jugular veins, enlarged patent external jugular veins. Electronically Signed   By: Elon Alas M.D.   On: 03/31/2015 03:20   Ct Chest W Contrast  03/31/2015  CLINICAL DATA:   RIGHT side neck pain,swelling x 2 days; pt had CT Neck done, which was suggestive of lymphadenopathy; pt has dry cough,denies any chest painESRD pt, to be dialyzed later today EXAM: CT CHEST WITH CONTRAST TECHNIQUE: Multidetector CT imaging of the chest was performed during intravenous contrast administration. CONTRAST:  4mL OMNIPAQUE IOHEXOL 300 MG/ML  SOLN COMPARISON:  None. FINDINGS: The central airways are patent. There is no focal consolidation, pleural effusion or pneumothorax. There is a 6 mm subpleural left lower lobe pulmonary nodule (image 37/ series 3). There is mild bilateral axillary lymphadenopathy. The largest left axillary lymph node  measures 12 mm. The largest right axillary lymph node measures 12.6 mm. There is mediastinal lymphadenopathy with the largest right upper paratracheal lymph node measuring 12.5 mm. There is an enlarged subcarinal lymph node measuring 2 cm. There is supraclavicular lymphadenopathy with the largest right supraclavicular lymph node measuring 12 mm. The largest left infraclavicular lymph node measures 13.8 mm. There is a spiculated masslike area in the medial aspect of the right pectoralis major muscle with ill-defined margins and haziness in the surrounding fat with the overall area measuring 5.3 x 2.3 cm. The heart size is normal. There is coronary artery atherosclerosis in the LAD. There are numerous left chest wall collaterals vessels. There is no pericardial effusion. The thoracic aorta is normal in caliber. Review of bone windows demonstrates no focal lytic or sclerotic lesions. Limited non-contrast images of the upper abdomen were obtained. The adrenal glands appear normal. The remainder of the visualized abdominal organs are unremarkable. IMPRESSION: 1. Nonspecific bilateral supraclavicular/infraclavicular, axillary and mediastinal lymphadenopathy. Spiculated masslike area in the medial aspect of the right pectoralis major muscle with ill-defined margins. The  appearance is most concerning for malignancy until proven otherwise. Malignancy such as lymphoma or metastatic disease are the 2 main considerations. Tissue diagnosis recommended. 2. Nonspecific 6 mm subpleural left lower lobe pulmonary nodule. If the patient is at high risk for bronchogenic carcinoma, follow-up chest CT at 6-12 months is recommended. If the patient is at low risk for bronchogenic carcinoma, follow-up chest CT at 12 months is recommended. This recommendation follows the consensus statement: Guidelines for Management of Small Pulmonary Nodules Detected on CT Scans: A Statement from the [place] as published in Radiology 2005;237:395-400. Electronically Signed   By: Kathreen Devoid   On: 03/31/2015 08:47   US Abdomen Complete  04/07/2015  CLINICAL DATA:  Ascites EXAM: ULTRASOUND ABDOMEN COMPLETE COMPARISON:  CT abdomen pelvis - 04/11/2015 FINDINGS: Gallbladder: Post cholecystectomy Common bile duct: Diameter: Normal in size measuring 3.9 mm in diameter Liver: Homogeneous hepatic echotexture. No discrete hepatic lesions. No definite evidence of intrahepatic biliary ductal dilatation. No ascites. IVC: No abnormality visualized. Pancreas: Limited visualization of the pancreatic head and neck is normal. Visualization of the pancreatic body and tail is obscured by bowel gas. Spleen: Normal in size measuring 11 cm in length Right Kidney: The right kidney is slightly atrophic measuring only 9.4 cm in length. There is diffuse renal cortical thinning with associated increased renal cortical echogenicity. There is an approximately 2.5 x 1.7 x 1.9 cm anechoic cyst arising mid aspect of the right kidney as well as an adjacent approximately 1.2 x 1.2 x 1.4 cm anechoic cyst within the mid/inferior aspect of the right kidney. No evidence of right-sided urinary obstruction. Left Kidney: Obscured by colonic interposition and patient body habitus. Abdominal aorta: No aneurysm visualized. Other findings:  None. IMPRESSION: 1. Interval resolution of previously noted trace amount of perihepatic ascites. 2. The right kidney appears atrophic with diffuse increased renal cortical echogenicity, nonspecific though could be seen in the setting of medical renal disease - clinical correlation is advised. No evidence of right-sided urinary obstruction. 3. Nonvisualization of the left kidney secondary to colonic interposition and patient body habitus. Electronically Signed   By: Sandi Mariscal M.D.   On: 04/07/2015 19:06   Ct Abdomen Pelvis W Contrast  04/01/2015  CLINICAL DATA:  Adenopathy on a recent chest CT. The patient complains of right neck swelling with mild adenopathy in the neck on a recent neck CT. EXAM: CT ABDOMEN AND  PELVIS WITH CONTRAST TECHNIQUE: Multidetector CT imaging of the abdomen and pelvis was performed using the standard protocol following bolus administration of intravenous contrast. CONTRAST:  149mL OMNIPAQUE IOHEXOL 300 MG/ML  SOLN COMPARISON:  Recent neck and chest CT examinations. Abdomen and pelvis CT dated 11/26/2013. FINDINGS: Small amount of free peritoneal fluid. The lateral segment of the left lobe and the caudate lobe of the liver are enlarged and the right lobe is somewhat small. Normal sized spleen. Oval, mass-like area in the left lower abdomen and upper pelvis, measuring 4.0 x 2.3 cm on image number 54 and 4.4 cm in length on sagittal image number 119. This has a peripheral medium density component containing calcifications and is lower in density centrally. This has been present on multiple previous examinations and is slightly smaller than on 09/14/2010. Right common and external iliac artery stent. Bilateral femoral artery bypass grafts. The grafts appear patent. Cholecystectomy clips. Poor distention of the stomach with diffuse wall thickening. No small bowel or colonic abnormalities. No evidence of appendicitis. Mildly prominent bilateral common iliac lymph nodes with mild progression  on the right. The large node on the right measures 9 mm in short axis diameter on image number 68. This measured 8 mm in short axis diameter on 11/26/2013. Mildly prominent bilateral internal iliac lymph nodes are unchanged. Minimally prominent retroperitoneal nodes without abnormal enlargement. Small kidneys containing multiple cysts. Atheromatous arterial calcifications. Unremarkable uterus and ovaries. The pancreas and adrenal glands are also unremarkable. Minimal dependent atelectasis at both lung bases. Unremarkable bones. IMPRESSION: 1. Interval small amount of ascites in the abdomen and pelvis. 2. Probable early changes of cirrhosis of the liver. 3. Poor distention of the stomach with diffuse wall thickening. The wall thickening could be due to underdistention, gastritis or diffuse neoplastic involvement, with differential considerations including gastric carcinoma and lymphoma. 4. Borderline enlarged bilateral common iliac lymph nodes with mild progression, specially on the right. Otherwise, no adenopathy. 5. Bilateral renal atrophy and multiple cysts. 6. Stable to slightly decreased in size of a probable old left renal transplant. Electronically Signed   By: Claudie Revering M.D.   On: 04/01/2015 17:06   US Biopsy  04/05/2015  CLINICAL DATA:  44 year old female with a history of head and neck lymphadenopathy EXAM: ULTRASOUND GUIDED CORE BIOPSY OF HEAD NECK LYMPH NODES MEDICATIONS: 1.0 mg IV Versed; 50 mcg IV Fentanyl Total Moderate Sedation Time: 20 PROCEDURE: The procedure, risks, benefits, and alternatives were explained to the patient. Questions regarding the procedure were encouraged and answered. The patient understands and consents to the procedure. Ultrasound survey of the left neck was performed with images stored and sent to PACs. The left neck was prepped with Betadine in a sterile fashion, and a sterile drape was applied covering the operative field. A sterile gown and sterile gloves were used for  the procedure. Local anesthesia was provided with 1% Lidocaine. Once the patient is prepped and draped sterilely, the skin and subcutaneous tissues were generously infiltrated with 1% lidocaine without epinephrine. A small stab incision was made, an using ultrasound guidance, several separate 18 gauge core biopsy were retrieved of enlarged lymph node of the left cervical region. Final images demonstrate no complicating features. Patient tolerated the procedure well and remained hemodynamically stable throughout. No complications encountered and no significant blood loss encountered. COMPLICATIONS: None. FINDINGS: Ultrasound survey demonstrates borderline enlarged lymph node of the left head and neck, with the usual architecture maintained. Images during the case demonstrate multiple core biopsy with the tip  of the needle/biopsy device through the node. IMPRESSION: Status post ultrasound-guided biopsy of left cervical lymph node. Sample was sent for culture, cytology, and pathology. Signed, [name]. [name], DO [facility] Radiology Electronically Signed   By: Corrie Mckusick D.O.   On: 04/05/2015 08:09    TEE (04/05/15):  Study Conclusions  - Left ventricle: Systolic function was normal. The estimated ejection fraction was in the range of 60% to 65%. Wall motion was normal; there were no regional wall motion abnormalities. - Aortic valve: No evidence of vegetation. There was trivial regurgitation. - Mitral valve: No evidence of vegetation. There was mild regurgitation. - Left atrium: No evidence of thrombus in the atrial cavity or appendage. - Right atrium: No evidence of thrombus in the atrial cavity or appendage. - Atrial septum: No defect or patent foramen ovale was identified. There was an atrial septal aneurysm. - Tricuspid valve: There was moderate regurgitation.  Impressions:  - No evidence of thrombus of  vegetation.  Admission HPI: BRIGHTON ABOOD is a 44 y.o. female who has a past medical history of Hypertension, Diabetes mellitus type 1 with multiple admissions for DKA, h/o DVT after AVG placement ~1 month ago on coumadin (INR subtherapeutic today at 1.64), GERD, asthma, h/o failed simultaneous kidney and pancreas transplant 2004 at [facility] (was on cellcept, prednisone, prograf for ~ 6 yrs), "mini stroke", ESRD on HD TTS who p/w neck swelling that began about 2 weeks ago. Pt had left sided swelling that began 2 weeks ago and then developed right sided swelling several days ago. She reports the right side is very painful and tender to palpation. She denies any night sweats, fevers/chills, pharyngitis, dysphagia, dyspnea, CP, weight loss, fatigue, abdominal pain, rash, or any other swellings. She does endorse decreased appetite for the past month. No FH of any malignancies. She lives alone in [place]. She has no pets and denies any sick contacts. She is sexually active. Denies any IVDU, tobacco abuse, or alcohol use. She has a PCP at [facility] that she saw about 3 weeks ago. Last HD was on Tuesday without complication and follows with Dr. Lorrene Reid who also manages her coumadin. She was diagnosed with DM when she was 44 yrs old.   In the ED, CT scan of neck revealed mild pharyngitis with small retropharyngeal effusion w/o abscess. Also w/ lymphadenopathy throughout the neck and mediastinal lymphadenopathy. Also with chronically occluded bilateral IJ veins and enlarged patent external jugular veins. CT of chest recommended and revealed subpleural LLL pulmonary nodule, mild bilateral axillary lymphadenopathy (largest 31mm). Also w/mediastinal adenopathy, supraclavicular and infraclavicular adenopathy. There is a spiculated masslike area in the right pectoralis will ill defined margins. Appearance most concerning for lymphoma or metastatic disease.    Hospital Course by problem list: Principal  Problem:   Diffuse lymphadenopathy Active Problems:   DM (diabetes mellitus), type 1 with renal complications ([facility])   History of simultaneous kidney and pancreas transplant ([place])   End-stage renal disease on hemodialysis ([facility])   Chronic diastolic heart failure ([facility])   History of DVT of lower extremity   Coag negative Staphylococcus bacteremia   Bacteremia   Diffuse lymphadenopathy, spiculated pectoral mass, pulmonary nodule, and stomach wall thickening: Diffuse lymphadenopathy seen on CT neck and chest concerning for lymphoma vs metastatic disease vs reactive lymph nodes. Imaging showing nonspecific bilateral supraclavicular/infraclavicular, axillary and mediastinal lymphadenopathy. Spiculated masslike area in the medial aspect of the right pectoralis major muscle with ill-defined margins. In addition, a 6  mm subpleural left lower lobe pulmonary nodule. CT abdomen showing small ascites, some cirrhotic changes of liver, diffuse stomach wall thickening concerning for gastritis vs neoplastic involvement. Also showing borderline enlarged bilateral common iliac lymph nodes with mild progression, specially on the right. HIV and LDH negative. FNA of cervical LN showing benign lymph nodal tissue, however, insufficient cells for tissue flow cytometry.  Excisional biopsy of cervical lymph nodes was also performed - showing paracortical hyperplasia but no metastatic carcinoma. Tissue flow cytometry showing no monoclonal B cells or phenotypically abberant T cells. Please pursuit further workup by ordering an MRI to better visualize the spiculated pectoral mass. In addition, consider consulting interventional radiology for biopsy of the pectoral mass. During the hospitalization GI was consulted but they did not think patient was stable at the time to undergo EGD. As such, since the patient is stable now, please consider GI referral for possible EGD + biopsy of the area of gastric wall thickening.   Fever - BCX  growing Strep viridans, coagulase negative staph, and pseudomonas fluorescence. Vancomycin started on 11/5. TEE did not show any vegetations. ID has been consulted and was on board. Skin was examined and no rashes noted. Vascular surgery was consulted and they did not think her dialysis access sites appeared infected. Duplex showing a few small isolated areas of fluid around the grafts but no obvious signs of infection. [place] of abdomen showing trace perihepatic ascites. Panoramic dental x-ray did not show dental abscess or osseous abnormality. Patient was treated with Vancomycin and Imipenem-cilastin. She had been afebrile for 5 days prior to discharge. Repeat blood culture showing no growth in 5 days. She is to continue getting vancomycin with HD for a total of 14 days from the date of 1st negative blood culture (last date of drug administration will be 11/26).   Cough: CXR on 11/7 consistent with atelectasis/ mild interstitial edema/ infection. Wolverine growing Strep viridans, coagulase negative staph, and pseudomonas fluorescence. Patient was treated with antibiotics as above, Mucinex DM, Albuterol nebulizer, Singulair, and incentive spirometer use encouraged. Cough improved over time and resolved by the time of discharge.   Severe hyperglycemia with DM Type I - Patient's blood sugars fluctuated throughout her hospital stay from being high >1000 upon admission (no acidosis or anion gap) to lows of 40s. Insulin regimen has to to be adjusted several times before her sugars stabilized. Please discuss diabetes medication regimen with patient during her follow-up visit.   ESRD on HD (TTS): New left AVG in 12/2014. Patient was dialyzed regularly during her hospital stay. Nephro was on board.   Discharge Vitals:   BP 147/63 mmHg  Pulse 100  Temp(Src) 98.2 F (36.8 C) (Oral)  Resp 17  Ht 5\' 1"  (1.549 m)  Wt 149 lb 7.6 oz (67.8 kg)  BMI 28.26 kg/m2  SpO2 95%  LMP 03/02/2015  Discharge Labs:  No results found  for this or any previous visit (from the past 24 hour(s)).  Signed: Shela Leff, MD 04/15/2015, 4:39 PM    Services Ordered on Discharge: None Equipment Ordered on Discharge:None

## 2015-04-12 NOTE — Progress Notes (Signed)
ANTICOAGULATION CONSULT NOTE - Follow Up Consult  Pharmacy Consult for Lovenox / Coumadin Indication: DVT  Allergies  Allergen Reactions  . Depakote [Divalproex Sodium] Other (See Comments)    Pt gets "delirious"  . Morphine And Related Nausea And Vomiting and Other (See Comments)    "makes me delirious"  . Penicillins Anaphylaxis    Tolerated Zosyn Dec 2014  . Tramadol Nausea And Vomiting  . Imitrex [Sumatriptan] Other (See Comments)    seizures  . Vicodin [Hydrocodone-Acetaminophen] Itching and Rash     Labs:  Recent Labs  04/10/15 1035 04/10/15 1255 04/10/15 2252 04/11/15 1426 04/12/15 0825 04/12/15 0859  HGB 9.6*  --   --  10.0* 9.3*  --   HCT 32.4*  --   --  33.4* 31.4*  --   PLT 161  --   --  142* 135*  --   LABPROT  --  21.2*  --  20.8* 22.5*  --   INR  --  1.85*  --  1.80* 1.99*  --   HEPARINUNFRC  --   --  0.15*  --   --   --   CREATININE 5.14*  --   --   --   --  8.68*    Estimated Creatinine Clearance: 7.5 mL/min (by C-G formula based on Cr of 8.68).   Assessment: 44 year old female s/p lymph node biopsy continues on anti-coagulation with Lovenox and Coumadin INR today is 1.99  Goal of Therapy:  INR 2-3 Monitor platelets by anticoagulation protocol: Yes   Plan:  Lovenox 70 mg sq Q 24 hours  Coumadin 5 mg po x 1 today  Daily INR  Thank you Anette Guarneri, PharmD [phone]  04/12/2015, 11:46 AM

## 2015-04-12 NOTE — Progress Notes (Signed)
[facility] Progress Note  Assessment/Plan: 1. R chest and neck pain - "massive" LN discovered at open biopsy and sent for pathology 2. MRSE bacteremia/possible Pseudomonas bacteremia: No evidence of SBE on echocardiogram-on vancomycin and Primaxin per recommendations of infectious disease. Continue antibiotics as ordered.  3. ESRD - TTS - HD today 4. BP/volume- slightly over dry but BP's low, min UF w HD today, stable exam 5. Anemia on high-dose ESA with stable hemoglobin-no overt losses, continue to monitor anticipating resistance from infection/inflammation complex. HGB 10. Follow CBC.  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 Chronic coumadin for DVT - per pharm 8. DM per primary - BS labile - diet is very high in simple carbs- needs complex carbs and protein 9. Malnutrition: Likely from recent infection/chronic illness and possible malignancy given diffuse adenopathy/gastric wall thickening and spiculated lesion in pectoralis muscle-awaiting definitive diagnosis. Continue renal diet/protein supplementation   Kelly Splinter MD [place] pager [phone]    cell [phone] 04/12/2015, 9:15 AM    Subjective:  "I'm feeling pretty good, I just hurt a little". R subclavian incision intact. Denies fevers, chills at present.   Objective Filed Vitals:   04/12/15 0657 04/12/15 0805 04/12/15 0830 04/12/15 0900  BP:  115/57 95/57 88/46   Pulse:  98  89  Temp: 99.9 F (37.7 C) 97.8 F (36.6 C)    TempSrc: Oral Axillary    Resp:  18 20 16   Height:      Weight:  68.9 kg (151 lb 14.4 oz)    SpO2:  95%     Physical Exam General: Chronically ill appearing  Heart: S1, S2, II/VI systolic M. Lungs: Bilateral breath sounds CTA Abdomen: soft, non-tender, active BS Extremities: trace pretibial edema.  Dialysis Access: R thigh AVG + bruit  Dialysis Orders: TueThuSat, 3 hrs 45 min, 160NRe Optiflux, BFR 400, DFR Autoflow 1.5,  EDW 66.5 (kg), Dialysate 2.0 K, 2.5 Ca UFR Profile: None, Sodium Model: Linear, Access: AV Graft No Heparin Venofer 50 mg IV weekly Hectoral 1 mcg IV Q TTS   Additional Objective Labs: Basic Metabolic Panel:  Recent Labs Lab 04/07/15 0743 04/09/15 0720 04/10/15 1035  NA 126* 131* 129*  K 3.5 4.0 4.2  CL 89* 96* 93*  CO2 25 26 28   GLUCOSE 122* 130* 171*  BUN 31* 23* 12  CREATININE 7.87* 7.48* 5.14*  CALCIUM 8.1* 8.0* 8.1*  PHOS 3.3 2.6 2.1*   Liver Function Tests:  Recent Labs Lab 04/07/15 0743 04/09/15 0720 04/10/15 1035  ALBUMIN 2.3* 2.3* 2.4*   No results for input(s): LIPASE, AMYLASE in the last 168 hours. CBC:  Recent Labs Lab 04/07/15 0558 04/09/15 0720 04/10/15 1035 04/11/15 1426 04/12/15 0825  WBC 7.5 7.6 7.9 10.6* 9.5  NEUTROABS  --   --  5.0  --   --   HGB 9.2* 9.5* 9.6* 10.0* 9.3*  HCT 29.4* 30.8* 32.4* 33.4* 31.4*  MCV 94.8 96.9 97.3 97.7 98.1  PLT 125* 150 161 142* 135*   Blood Culture    Component Value Date/Time   SDES BLOOD RIGHT HAND 04/09/2015 2200   SPECREQUEST  04/09/2015 2200    BOTTLES DRAWN AEROBIC AND ANAEROBIC 10CC BLUE 8CC PURPLE   CULT NO GROWTH 2 DAYS 04/09/2015 2200   REPTSTATUS PENDING 04/09/2015 2200    Cardiac Enzymes: No results for input(s): CKTOTAL, CKMB, CKMBINDEX, TROPONINI in the last 168 hours. CBG:  Recent Labs Lab 04/11/15 1118 04/11/15 1312 04/11/15 1655  04/11/15 2033 04/12/15 0740  GLUCAP 148* 182* 254* 241* 270*   Iron Studies: No results for input(s): IRON, TIBC, TRANSFERRIN, FERRITIN in the last 72 hours. @lablastinr3 @ Studies/Results: No results found. Medications: . sodium chloride 10 mL/hr at 04/11/15 1111   . calcium acetate  667 mg Oral BID WC  . clonazePAM  1 mg Oral QHS  . darbepoetin (ARANESP) injection - DIALYSIS  200 mcg Intravenous Q Thu-HD  . dextromethorphan-guaiFENesin  1 tablet Oral BID  . dextrose  1 ampule Intravenous Once  . diclofenac sodium  2 g Topical QID  .  doxercalciferol  1 mcg Intravenous Q T,Th,Sa-HD  . enoxaparin (LOVENOX) injection  1 mg/kg Subcutaneous Q24H  . feeding supplement (NEPRO CARB STEADY)  237 mL Oral BID BM  . gabapentin  100 mg Oral Daily  . guaiFENesin  600 mg Oral BID  . imipenem-cilastatin  250 mg Intravenous Q12H  . insulin aspart  0-5 Units Subcutaneous TID WC  . insulin aspart  2 Units Subcutaneous TID WC  . insulin glargine  2 Units Subcutaneous Q2200  . insulin glargine  4 Units Subcutaneous q morning - 10a  . lidocaine  1 patch Transdermal Q24H  . metoCLOPramide  10 mg Oral TID AC & HS  . montelukast  10 mg Oral QHS  . multivitamin  1 tablet Oral Daily  . pantoprazole  40 mg Oral QHS  . simvastatin  20 mg Oral QHS  . vancomycin  750 mg Intravenous Q T,Th,Sa-HD  . Warfarin - Pharmacist Dosing Inpatient   Does not apply [phone]

## 2015-04-12 NOTE — Progress Notes (Signed)
Subjective:  Hospital day 86  States she is feeling better and is ready to go home.  Still having some pain in the area of the excisional biopsy, but this is helped by pain medication.  No N/V.  States she was told her O2 sats were low overnight but denies any SOB or difficulty breathing.  Thinks cough is mostly improved.  No lower extremity swelling or pain.  Objective: Vital signs  Filed Vitals:   04/12/15 0900 04/12/15 0930 04/12/15 1030 04/12/15 1130  BP: 88/46 95/48 100/52 133/62  Pulse: 89 86 86 102  Temp:      TempSrc:      Resp: 16 15 14 15   Height:      Weight:      SpO2:      Temp noted to be 100.2 and 99.9 between 5-7am on 11/15, received tylenol, otherwise afebrile   Weight change: 0.3 kg (10.6 oz)  Filed Weights   04/11/15 0500 04/11/15 2035 04/12/15 0805  Weight: 70 kg (154 lb 5.2 oz) 70.3 kg (154 lb 15.7 oz) 68.9 kg (151 lb 14.4 oz)    Intake/Output Summary (Last 24 hours) at 04/12/15 1152 Last data filed at 04/12/15 0636  Gross per 24 hour  Intake    565 ml  Output      0 ml  Net    565 ml   Physical exam Vitals Blood pressure 147/63, pulse 100, temperature 98.2 F (36.8 C),  resp rate 17, height 5\' 1"  (1.549 m), weight 67.8 kg (149 lb 7.6 oz), SpO2 95 % on room air  General Chronically-ill appearing black female in NAD  HEENT Pt has conjugate gaze at baseline, 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  Sclera are muddy but anicteric.  Diffusely poor dentition.    Neck Left supraclavicular swelling visible but not palpated today.  Incisional site of right lymph node biopsy has no surrounding erythema.  Cardiovascular Regular rate and rhythm, audible holosystolic murmur audible at both upper sternal borders  Pulmonary Patient has normal work of breathing while at rest.   Crackles on bilateral lower lobes, ?if crackles are also present in right middle lobe, however, exam somewhat limited by patient positioning  Abdomen Soft, nontender, nondistended  Skin  Multiple tattoos visible on arms and legs, no obvious rash    Neuro Alert and oriented to person, place and time   Lab Results: CMP Latest Ref Rng 04/12/2015 04/10/2015 04/09/2015  Glucose 65 - 99 mg/dL 296(H) 171(H) 130(H)  BUN 6 - 20 mg/dL 30(H) 12 23(H)  Creatinine 0.44 - 1.00 mg/dL 8.68(H) 5.14(H) 7.48(H)  Sodium 135 - 145 mmol/L 128(L) 129(L) 131(L)  Potassium 3.5 - 5.1 mmol/L 5.5(H) 4.2 4.0  Chloride 101 - 111 mmol/L 91(L) 93(L) 96(L)  CO2 22 - 32 mmol/L 22 28 26   Calcium 8.9 - 10.3 mg/dL 8.6(L) 8.1(L) 8.0(L)  Total Protein 6.5 - 8.1 g/dL - - -  Total Bilirubin 0.3 - 1.2 mg/dL - - -  Alkaline Phos 38 - 126 U/L - - -  AST 15 - 41 U/L - - -  ALT 14 - 54 U/L - - -   CBC Latest Ref Rng 04/12/2015 04/11/2015 04/10/2015  WBC 4.0 - 10.5 K/uL 9.5 10.6(H) 7.9  Hemoglobin 12.0 - 15.0 g/dL 9.3(L) 10.0(L) 9.6(L)  Hematocrit 36.0 - 46.0 % 31.4(L) 33.4(L) 32.4(L)  Platelets 150 - 400 K/uL 135(L) 142(L) 161    Micro Results: Recent Results (from the past 240 hour(s))  Culture, blood (routine  x 2)     Status: None   Collection Time: 04/03/15 11:45 AM  Result Value Ref Range Status   Specimen Description BLOOD RIGHT HAND  Final   Special Requests IN PEDIATRIC BOTTLE 4CC  Final   Culture  Setup Time   Final    GRAM POSITIVE COCCI IN CLUSTERS CRITICAL RESULT CALLED TO, READ BACK BY AND VERIFIED WITH: Louanne Skye 10:25 04/04/15 ([email or web address])    Culture   Final    STAPHYLOCOCCUS SPECIES (COAGULASE NEGATIVE) SUSCEPTIBILITIES PERFORMED ON PREVIOUS CULTURE WITHIN THE LAST 5 DAYS. PSEUDOMONAS FLUORESCENS CRITICAL RESULT CALLED TO, READ BACK BY AND VERIFIED WITH: DR [facility]  04/07/15 @ 1113 M VESTAL    Report Status 04/09/2015 FINAL  Final   Organism ID, Bacteria PSEUDOMONAS FLUORESCENS  Final      Susceptibility   Pseudomonas fluorescens - MIC*    CEFTAZIDIME 16 INTERMEDIATE Intermediate     CIPROFLOXACIN >=4 RESISTANT Resistant     GENTAMICIN <=1 SENSITIVE Sensitive     IMIPENEM 0.5  SENSITIVE Sensitive     * PSEUDOMONAS FLUORESCENS  Culture, blood (routine x 2)     Status: None   Collection Time: 04/03/15  7:35 PM  Result Value Ref Range Status   Specimen Description BLOOD RIGHT ANTECUBITAL  Final   Special Requests BOTTLES DRAWN AEROBIC ONLY 8CC  Final   Culture NO GROWTH 5 DAYS  Final   Report Status 04/08/2015 FINAL  Final  Tissue culture     Status: None   Collection Time: 04/04/15  5:15 PM  Result Value Ref Range Status   Specimen Description TISSUE  Final   Special Requests HEAD NECK LYMPHADENOPATHY  Final   Gram Stain   Final    NO WBC SEEN NO ORGANISMS SEEN Performed at [facility]    Culture   Final    NO GROWTH 3 DAYS Performed at [facility]    Report Status 04/08/2015 FINAL  Final  Culture, blood (routine x 2)     Status: None   Collection Time: 04/05/15 12:22 AM  Result Value Ref Range Status   Specimen Description BLOOD RIGHT HAND  Final   Special Requests IN PEDIATRIC BOTTLE 3CC  Final   Culture  Setup Time   Final    GRAM POSITIVE COCCI IN CHAINS IN PAIRS AEROBIC BOTTLE ONLY CRITICAL RESULT CALLED TO, READ BACK BY AND VERIFIED WITHDwaine Gale RN [id number] 04/05/15 A [facility]    Culture   Final    VIRIDANS STREPTOCOCCUS STAPHYLOCOCCUS SPECIES (COAGULASE NEGATIVE) SUSCEPTIBILITIES PERFORMED ON PREVIOUS CULTURE WITHIN THE LAST 5 DAYS.    Report Status 04/09/2015 FINAL  Final   Organism ID, Bacteria VIRIDANS STREPTOCOCCUS  Final      Susceptibility   Viridans streptococcus - MIC*    ERYTHROMYCIN >=8 RESISTANT Resistant     TETRACYCLINE >=16 RESISTANT Resistant     VANCOMYCIN 0.5 SENSITIVE Sensitive     CLINDAMYCIN >=1 RESISTANT Resistant     * VIRIDANS STREPTOCOCCUS  Culture, blood (routine x 2)     Status: None   Collection Time: 04/05/15 12:37 AM  Result Value Ref Range Status   Specimen Description BLOOD RIGHT HAND  Final   Special Requests BOTTLES DRAWN AEROBIC AND ANAEROBIC 4CC  Final   Culture NO GROWTH 5 DAYS   Final   Report Status 04/10/2015 FINAL  Final  Culture, blood (routine x 2)     Status: None (Preliminary result)   Collection Time: 04/09/15  9:50 AM  Result Value Ref Range Status   Specimen Description BLOOD GRAFT  Final   Special Requests BOTTLES DRAWN AEROBIC AND ANAEROBIC 10MLS  Final   Culture NO GROWTH 2 DAYS  Final   Report Status PENDING  Incomplete  Culture, blood (routine x 2)     Status: None (Preliminary result)   Collection Time: 04/09/15 10:00 PM  Result Value Ref Range Status   Specimen Description BLOOD RIGHT HAND  Final   Special Requests   Final    BOTTLES DRAWN AEROBIC AND ANAEROBIC 10CC BLUE [place] PURPLE   Culture NO GROWTH 2 DAYS  Final   Report Status PENDING  Incomplete   Studies/Results: TEE 11/8: Study Conclusions - Left ventricle: Systolic function was normal. The estimated ejection fraction was in the range of 60% to 65%. Wall motion was normal; there were no regional wall motion abnormalities. - Aortic valve: No evidence of vegetation. There was trivial regurgitation. - Mitral valve: No evidence of vegetation. There was mild regurgitation. - Left atrium: No evidence of thrombus in the atrial cavity or appendage. - Right atrium: No evidence of thrombus in the atrial cavity or appendage. - Atrial septum: No defect or patent foramen ovale was identified. There was an atrial septal aneurysm. - Tricuspid valve: There was moderate regurgitation. Impressions: - No evidence of thrombus or vegetation.  11/11 vascular ultrasound Summary: -The right lower extremity arteriovenous loop graft is patent. There is an area of perigraft fluid surrounding the mid portion of the graft. In the proximal portion of the graft, there is a small area of outpouching of the proximal graft which exhibits internal flow. -The left lower extremity arteriovenous loop graft is patent. There is perigraft fliud surrounding the proximal portion of the  graft.  Medications: I have reviewed the patient's current medications. Scheduled Meds: . calcium acetate  667 mg Oral BID WC  . clonazePAM  1 mg Oral QHS  . Darbepoetin Alfa      . darbepoetin (ARANESP) injection - DIALYSIS  200 mcg Intravenous Q Thu-HD  . dextromethorphan-guaiFENesin  1 tablet Oral BID  . dextrose  1 ampule Intravenous Once  . diclofenac sodium  2 g Topical QID  . doxercalciferol      . doxercalciferol  1 mcg Intravenous Q T,Th,Sa-HD  . enoxaparin (LOVENOX) injection  1 mg/kg Subcutaneous Q24H  . feeding supplement (NEPRO CARB STEADY)  237 mL Oral BID BM  . gabapentin  100 mg Oral Daily  . guaiFENesin  600 mg Oral BID  . imipenem-cilastatin  250 mg Intravenous Q12H  . insulin aspart  0-5 Units Subcutaneous TID WC  . insulin aspart  2 Units Subcutaneous TID WC  . insulin glargine  2 Units Subcutaneous Q2200  . insulin glargine  4 Units Subcutaneous q morning - 10a  . lidocaine  1 patch Transdermal Q24H  . metoCLOPramide  10 mg Oral TID AC & HS  . montelukast  10 mg Oral QHS  . multivitamin  1 tablet Oral Daily  . pantoprazole  40 mg Oral QHS  . simvastatin  20 mg Oral QHS  . vancomycin  750 mg Intravenous Q T,Th,Sa-HD  . warfarin  5 mg Oral ONCE-1800  . Warfarin - Pharmacist Dosing Inpatient   Does not apply q1800   Continuous Infusions: . 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  Now s/p excisional biopsy of right supraclavicular, performed 11/14, results pending.   #Lymphadenopathy suspected 2/2 lymphoma vs mets: pt has bilateral supraclavicular node lymphadenopathy and CT showing mediastinal lymphadenopathy with spiculated mass in right pectoralis major, now s/p excisional biopsy  -Pathology pending, will need to contact patients with results  #Multi-organism bacteremia with unclear source: 11/8 culture was positive for coag-negative staph, strep viridans and pseudomonas fluorescens, no growth 2 days on repeat culture obtained 11/12 -Discontinue imipenem per ID recommendations -Continue 750mg  Vanc during T/Th/Sat HD until 04/23/15 -No clear source-- 11/11 ultrasound of HD access sites showed no infection; panorex showed no obvious dental abscess, no self injection behaviors noted while patient observed in camera room  #T1DM: very brittle/responsive to insulin, adjustments this hospitalization per diabetes coordinator recommendations; ideally would have sugars maintained within the 100-150 range  -Patient has been on  4 units lantus in the AM, 2 units lantus in PM, 2 units insulin aspart TID with meals and SSI in the hospital, however, will resume home regimen on discharge (7 Lantus BID + 6 Humalog BID) -PCP follow-up for further mangement  #SOB/cough: resolving  #Prior DVT: warfarin was held 11/4 --> 11/8 for biopsy -Resume Warfarin per pharmacy recs for HD -Recheck PT/INR at PCP on 11/21  #Pain: -Tylenol 650mg  q6hrs -Will give 7 day supply of 5mg  oxycodone for pain with PCP follow-up  Other problems per  prior progress notes.   Dispo: Discharge to home today -Will need to follow-up on lymph node biopsy results and call patient to schedule f/u with heme-onc -Pt will get PT/INR checked at Thursday dialysis -Outpatient f/u initially scheduled for 17th-- patient unable to make 2/2 dialysis.  Rescheduled with PCP office to have f/u appt at 4:15pm on November 21st, pt will see Dr. Olegario Messier, [facility] Address: [address] [place], [place] [address] -Fax discharge instructions using following info: Fax: [phone]  ATTN: Dr. Sandi Mariscal / Dr. Olegario Messier    This is a Medical Student Note.  The care of the patient was discussed with Dr. Berline Lopes and the assessment and plan formulated with their assistance.  Please see their attached note for official documentation of the daily encounter.    LOS: 12 days   Ladell Pier, Med Student 04/12/2015, 11:52 AM

## 2015-04-12 NOTE — Progress Notes (Signed)
Discharge instructions given. Pt verbalized understanding and all questions were answered.  

## 2015-04-12 NOTE — Discharge Instructions (Signed)
-  Make sure you get your PT/INR checked at dialysis for your medication Coumadin.   -Make sure you get your antibiotic (vancomycin) at dialysis. Last date of administration will be 04/23/15.   -Please follow up with your primary care doctor on 04/14/15 at 1:00 pm.

## 2015-04-12 NOTE — Progress Notes (Signed)
Inpatient Diabetes Program Recommendations  AACE/ADA: New Consensus Statement on Inpatient Glycemic Control (2015)  Target Ranges:  Prepandial:   less than 140 mg/dL      Peak postprandial:   less than 180 mg/dL (1-2 hours)      Critically ill patients:  140 - 180 mg/dL    Results for [name], [name] (MRN [id number]) as of 04/12/2015 10:17  Ref. Range 04/11/2015 06:43 04/11/2015 07:42 04/11/2015 11:18 04/11/2015 13:12 04/11/2015 16:55 04/11/2015 20:33  Glucose-Capillary Latest Ref Range: 65-99 mg/dL 93 98 148 (H) 182 (H) 254 (H) 241 (H)    Results for [name], [name] (MRN [id number]) as of 04/12/2015 10:17  Ref. Range 04/12/2015 07:40  Glucose-Capillary Latest Ref Range: 65-99 mg/dL 270 (H)     Current Insulin Orders: Lantus 4 units AM/ 2 units QHS      Novolog Custom SSI (0-5 units) TID AC      Novolog 2 units tidwc      MD- Please note that AM dose of Lantus 4 units was held yesterday AM (11/14) b/c pt was NPO.  Patient did receive 2 units Lantus last PM.  As a result, patient's AM CBG today was elevated: 270 mg/dl.  Note patient is in dialysis this AM.  Patient likely needs to get this dose of Lantus since she missed her AM dose of Lantus yesterday.  Do not recommend Lantus adjustments today.     --Will follow patient during hospitalization--  Wyn Quaker RN, MSN, CDE Diabetes Coordinator Inpatient Glycemic Control Team Team Pager: [phone] (8a-5p)

## 2015-04-12 NOTE — Progress Notes (Signed)
Subjective: Patient is doing fine. Denies cp/sob/n/v/fever/chills. Patient is not complaining of a cough anymore. No other complaints.   Objective: Vital signs in last 24 hours: Filed Vitals:   04/12/15 1030 04/12/15 1130 04/12/15 1211 04/12/15 1348  BP: 100/52 133/62 129/63 147/63  Pulse: 86 102 95 100  Temp:   98.2 F (36.8 C)   TempSrc:   Oral   Resp: 14 15 16 17   Height:      Weight:   149 lb 7.6 oz (67.8 kg)   SpO2:   98% 95%   Weight change: 10.6 oz (0.3 kg)  Intake/Output Summary (Last 24 hours) at 04/12/15 1909 Last data filed at 04/12/15 1358  Gross per 24 hour  Intake    700 ml  Output   1101 ml  Net   -401 ml   Physical Exam: Gen: AAOx3, NAD HEENT: bilateral enlarged supraclavicular LN, mildly tender to palpation on right side CVS: RRR, S1 and S2 appreciated, systolic murmur   Lungs: mild bibasilar rales (improved from yesterday) Abd: soft, NT, ND. +BS.  Ext: no edema Skin: warm and dry.   Lab Results: Basic Metabolic Panel:  Recent Labs Lab 04/10/15 1035 04/12/15 0859  NA 129* 128*  K 4.2 5.5*  CL 93* 91*  CO2 28 22  GLUCOSE 171* 296*  BUN 12 30*  CREATININE 5.14* 8.68*  CALCIUM 8.1* 8.6*  PHOS 2.1* 4.3   Liver Function Tests:  Recent Labs Lab 04/10/15 1035 04/12/15 0859  ALBUMIN 2.4* 2.4*   CBC:  Recent Labs Lab 04/10/15 1035 04/11/15 1426 04/12/15 0825  WBC 7.9 10.6* 9.5  NEUTROABS 5.0  --   --   HGB 9.6* 10.0* 9.3*  HCT 32.4* 33.4* 31.4*  MCV 97.3 97.7 98.1  PLT 161 142* 135*   CBG:  Recent Labs Lab 04/11/15 1118 04/11/15 1312 04/11/15 1655 04/11/15 2033 04/12/15 0740 04/12/15 1250  GLUCAP 148* 182* 254* 241* 270* 208*   Coagulation:  Recent Labs Lab 04/09/15 0720 04/10/15 1255 04/11/15 1426 04/12/15 0825  LABPROT 21.5* 21.2* 20.8* 22.5*  INR 1.88* 1.85* 1.80* 1.99*    Micro Results: Recent Results (from the past 240 hour(s))  Culture, blood (routine x 2)     Status: None   Collection Time:  04/03/15 11:45 AM  Result Value Ref Range Status   Specimen Description BLOOD RIGHT HAND  Final   Special Requests IN PEDIATRIC BOTTLE 4CC  Final   Culture  Setup Time   Final    GRAM POSITIVE COCCI IN CLUSTERS CRITICAL RESULT CALLED TO, READ BACK BY AND VERIFIED WITH: Louanne Skye 10:25 04/04/15 ([email or web address])    Culture   Final    STAPHYLOCOCCUS SPECIES (COAGULASE NEGATIVE) SUSCEPTIBILITIES PERFORMED ON PREVIOUS CULTURE WITHIN THE LAST 5 DAYS. PSEUDOMONAS FLUORESCENS CRITICAL RESULT CALLED TO, READ BACK BY AND VERIFIED WITH: DR [facility]  04/07/15 @ 1113 M VESTAL    Report Status 04/09/2015 FINAL  Final   Organism ID, Bacteria PSEUDOMONAS FLUORESCENS  Final      Susceptibility   Pseudomonas fluorescens - MIC*    CEFTAZIDIME 16 INTERMEDIATE Intermediate     CIPROFLOXACIN >=4 RESISTANT Resistant     GENTAMICIN <=1 SENSITIVE Sensitive     IMIPENEM 0.5 SENSITIVE Sensitive     * PSEUDOMONAS FLUORESCENS  Culture, blood (routine x 2)     Status: None   Collection Time: 04/03/15  7:35 PM  Result Value Ref Range Status   Specimen Description BLOOD RIGHT ANTECUBITAL  Final   Special  Requests BOTTLES DRAWN AEROBIC ONLY 8CC  Final   Culture NO GROWTH 5 DAYS  Final   Report Status 04/08/2015 FINAL  Final  Tissue culture     Status: None   Collection Time: 04/04/15  5:15 PM  Result Value Ref Range Status   Specimen Description TISSUE  Final   Special Requests HEAD NECK LYMPHADENOPATHY  Final   Gram Stain   Final    NO WBC SEEN NO ORGANISMS SEEN Performed at [facility]    Culture   Final    NO GROWTH 3 DAYS Performed at [facility]    Report Status 04/08/2015 FINAL  Final  Culture, blood (routine x 2)     Status: None   Collection Time: 04/05/15 12:22 AM  Result Value Ref Range Status   Specimen Description BLOOD RIGHT HAND  Final   Special Requests IN PEDIATRIC BOTTLE 3CC  Final   Culture  Setup Time   Final    GRAM POSITIVE COCCI IN CHAINS IN PAIRS AEROBIC BOTTLE  ONLY CRITICAL RESULT CALLED TO, READ BACK BY AND VERIFIED WITHDwaine Gale RN [id number] 04/05/15 A BROWNING    Culture   Final    VIRIDANS STREPTOCOCCUS STAPHYLOCOCCUS SPECIES (COAGULASE NEGATIVE) SUSCEPTIBILITIES PERFORMED ON PREVIOUS CULTURE WITHIN THE LAST 5 DAYS.    Report Status 04/09/2015 FINAL  Final   Organism ID, Bacteria VIRIDANS STREPTOCOCCUS  Final      Susceptibility   Viridans streptococcus - MIC*    ERYTHROMYCIN >=8 RESISTANT Resistant     TETRACYCLINE >=16 RESISTANT Resistant     VANCOMYCIN 0.5 SENSITIVE Sensitive     CLINDAMYCIN >=1 RESISTANT Resistant     * VIRIDANS STREPTOCOCCUS  Culture, blood (routine x 2)     Status: None   Collection Time: 04/05/15 12:37 AM  Result Value Ref Range Status   Specimen Description BLOOD RIGHT HAND  Final   Special Requests BOTTLES DRAWN AEROBIC AND ANAEROBIC 4CC  Final   Culture NO GROWTH 5 DAYS  Final   Report Status 04/10/2015 FINAL  Final  Culture, blood (routine x 2)     Status: None (Preliminary result)   Collection Time: 04/09/15  9:50 AM  Result Value Ref Range Status   Specimen Description BLOOD GRAFT  Final   Special Requests BOTTLES DRAWN AEROBIC AND ANAEROBIC 10MLS  Final   Culture NO GROWTH 3 DAYS  Final   Report Status PENDING  Incomplete  Culture, blood (routine x 2)     Status: None (Preliminary result)   Collection Time: 04/09/15 10:00 PM  Result Value Ref Range Status   Specimen Description BLOOD RIGHT HAND  Final   Special Requests   Final    BOTTLES DRAWN AEROBIC AND ANAEROBIC 10CC BLUE 8CC PURPLE   Culture NO GROWTH 3 DAYS  Final   Report Status PENDING  Incomplete   Studies/Results: No results found. Medications: I have reviewed the patient's current medications. Scheduled Meds: . calcium acetate  667 mg Oral BID WC  . clonazePAM  1 mg Oral QHS  . Darbepoetin Alfa      . darbepoetin (ARANESP) injection - DIALYSIS  200 mcg Intravenous Q Thu-HD  . dextromethorphan-guaiFENesin  1 tablet Oral BID  .  dextrose  1 ampule Intravenous Once  . diclofenac sodium  2 g Topical QID  . doxercalciferol  1 mcg Intravenous Q T,Th,Sa-HD  . enoxaparin (LOVENOX) injection  1 mg/kg Subcutaneous Q24H  . feeding supplement (NEPRO CARB STEADY)  237 mL Oral BID  BM  . gabapentin  100 mg Oral Daily  . guaiFENesin  600 mg Oral BID  . imipenem-cilastatin  250 mg Intravenous Q12H  . insulin aspart  0-5 Units Subcutaneous TID WC  . insulin aspart  2 Units Subcutaneous TID WC  . insulin glargine  2 Units Subcutaneous Q2200  . insulin glargine  4 Units Subcutaneous q morning - 10a  . lidocaine  1 patch Transdermal Q24H  . metoCLOPramide  10 mg Oral TID AC & HS  . montelukast  10 mg Oral QHS  . multivitamin  1 tablet Oral Daily  . pantoprazole  40 mg Oral QHS  . simvastatin  20 mg Oral QHS  . vancomycin  750 mg Intravenous Q T,Th,Sa-HD  . warfarin  5 mg Oral ONCE-1800  . Warfarin - Pharmacist Dosing Inpatient   Does not apply q1800   Continuous Infusions: . 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 Also got CT abdomen which shows small ascites, some cirrhotic changes of liver, diffuse stomach wall thickening concerning for gastritis vs neoplastic involvement (gastric carcinoma vs lymphoma?). HIV and LDH negative. Excisional LN biopsy done by CT surgery yesterday.    - Coumadin with Lovenox bridge   -Oxy IR 10 q4hr + dilaudid for breakthrough.   -Voltaren gel prn pain  Fever - Patient has been afebrile for 5 days now. [place] initially growing Strep viridans, coagulase negative staph, and pseudomonas fluorescence. Vancomycin started on 11/5. TEE did not show any vegetations. ID has been consulted and is on board. They believe differential for fever includes infection vs tumor fever if patient does indeed have lymphoma/ mets. Skin was examined and no rashes noted. Vascular surgery was consulted and they do not think her dialysis access sites appear infected. Duplex showing a few small isolated areas of fluid around the grafts but no obvious signs of infection. [place] of abdomen showing trace perihepatic ascites. Panoramic dental x-ray did not show dental abscess or osseous abnormality. Repeat blood culture from 11/12 showing no growth (x2 bottles) in 3 days. Blood culture from 11/8 (one site) showing no growth for 5 days.  -cont vancomycin with HD for a total of 14 days from the date of 1st negative blood culture (last date will be 11/26) -cont Imipenem-Cilastatin for pseudomonas coverage. Stop it on discharge.  -Tylenol prn  Cough: Patient is not complaining of a cough anymore. Lung exam today remarkable for bibasilar rales, likely 2/2 atelectasis. Currently not complaining of any SOB. Satting 92-100% on RA. CXR on 11/7 consistent with atelectasis/ mild interstitial edema/ infection. [place] intially growing Strep viridans, coagulase negative staph, and pseudomonas fluorescence, however, no growth in 5 days now.   -Antibiotics as above  -continue Mucinex-DM for congestion  -Albuterol nebulizer prn -Singulair 10 mg daily -continue Incentive spirometer -encourage ambulation (PT on board)  Severe hyperglycemia with DM Type I - Patient's blood sugars are better controlled now. -continue Lantus 4 u in then morning and 2u in the  evening -continue Novolog 2 u TID -Modified  sensitive-SSI  Pseudohyponatremia - monitor with insulin regimen.  H/o LLE DVT (01/19/2015) - stop coumadin for now. Bridge with heparin.   HFpEF: Stable. TEE showing LVEF >55%, mild MR, moderate TR, and trivial AR. No PR.  -Monitor   ESRD on HD (TTS): New left AVG in 12/2014. HD today.  - appreciate nephro recs.   HTN: BP stable. Continue holding home Amlodipine 10 mg qd, Metoprolol 50 mg qd for now for low BP's.   HLD: continue Zocor 20 mg qd  GERD: Protonix 40 mg qd   Insomnia: continue Ambien qhs    Code: Full  Dispo: Disposition is deferred at this time, awaiting improvement of current medical problems.  Anticipated discharge in approximately 0 day(s).   The patient does have a current PCP [name], MD) and does need an [facility] hospital follow-up appointment after discharge.  The patient does not have transportation limitations that hinder transportation to clinic appointments.  .Services Needed at time of discharge: Y = Yes, Blank = No PT:   OT:   RN:   Equipment:   Other:     LOS: 12 days   Shela Leff, MD 04/12/2015, 7:09 PM

## 2015-04-14 LAB — CULTURE, BLOOD (ROUTINE X 2)
CULTURE: NO GROWTH
Culture: NO GROWTH

## 2015-04-14 IMAGING — CT CT HEAD W/O CM
2 series · 16 of 30 positions shown, 20 images · non-contrast
Comparison: 01/04/2013

CLINICAL DATA: Lethargy.

EXAM:
CT HEAD WITHOUT CONTRAST
TECHNIQUE: Contiguous axial images were obtained from the base of the skull
through the vertex without intravenous contrast.

[Series 2: head w/o · axial · non-contrast · 0.49mm/px · z∈[+49,+174]mm · 13 of 31 slices shown, 17 images]
[im 3/31  brain]
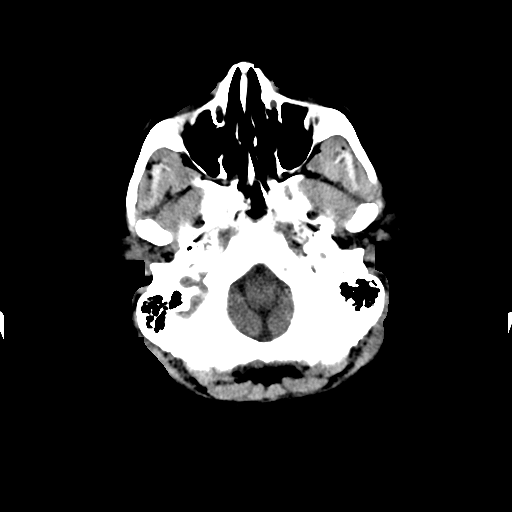
[im 3/31  bone]
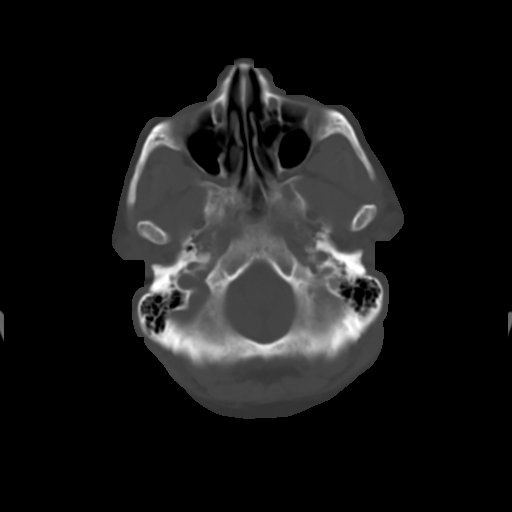
[im 5/31  brain]
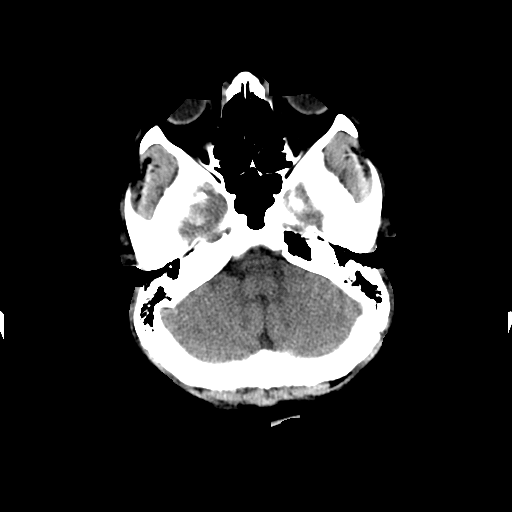
[im 7/31  brain]
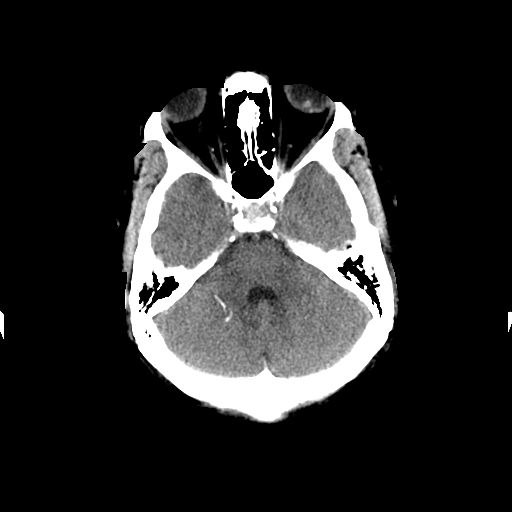
[im 9/31  brain]
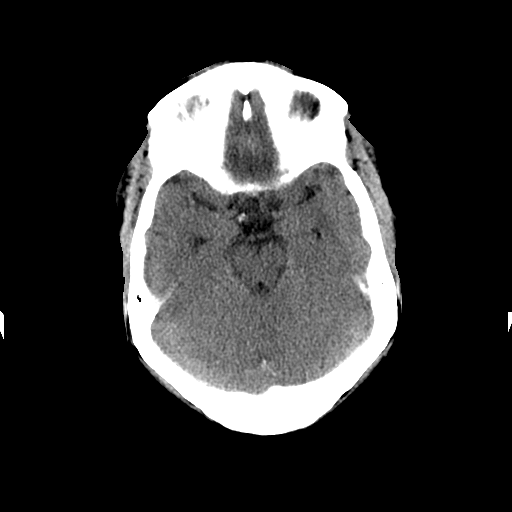
[im 11/31  brain]
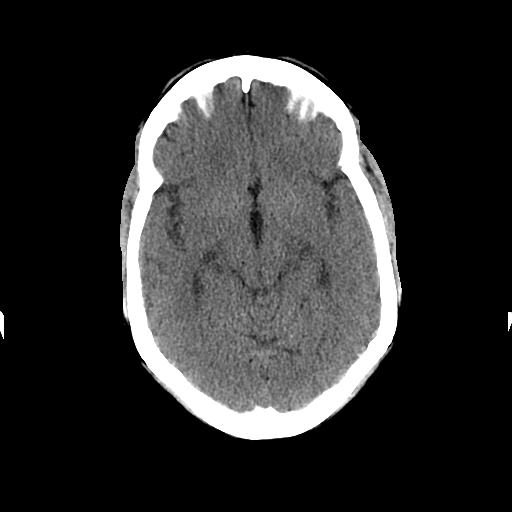
[im 11/31  bone]
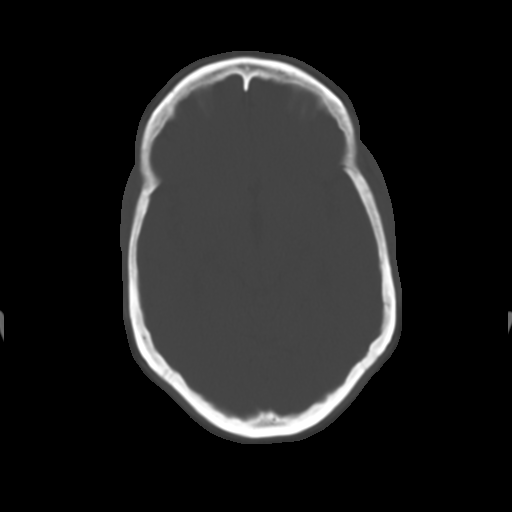
[im 13/31  brain]
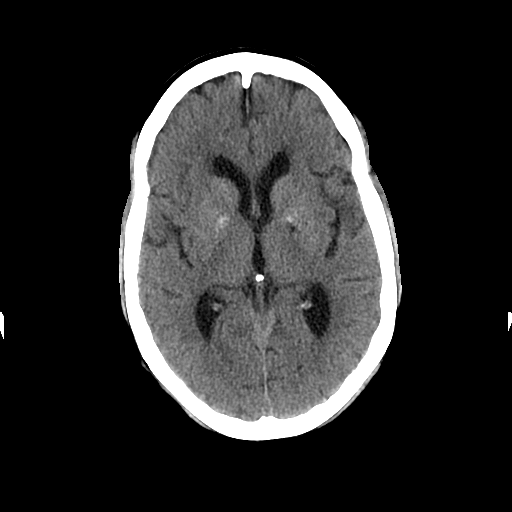
[im 16/31  brain]
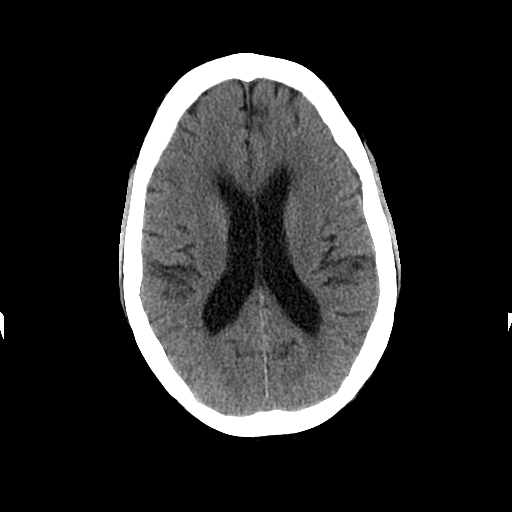
[im 18/31  brain]
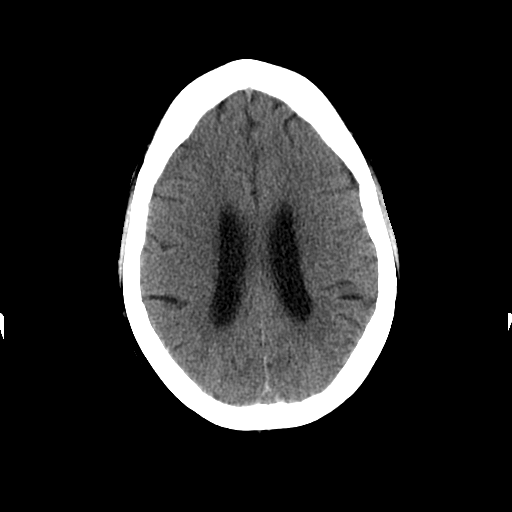
[im 20/31  brain]
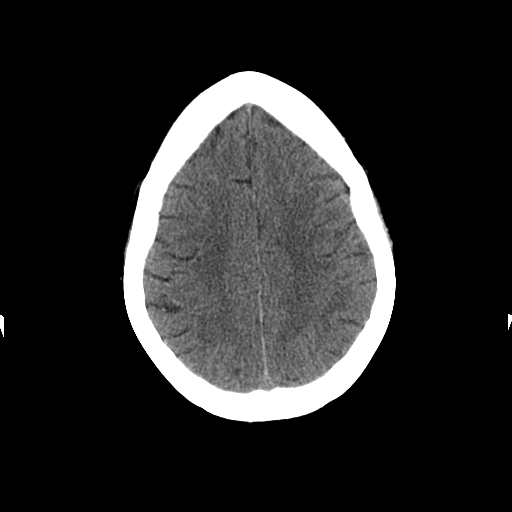
[im 20/31  bone]
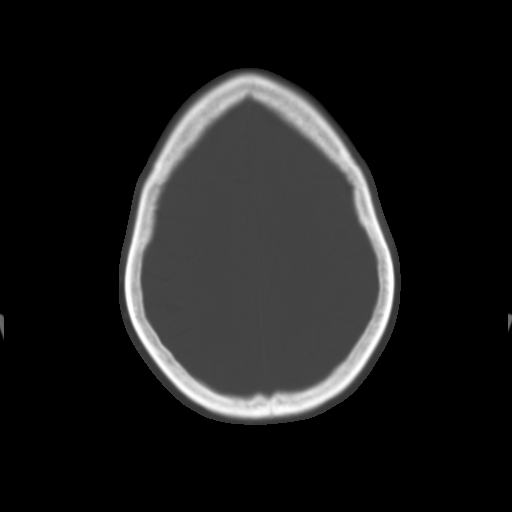
[im 22/31  brain]
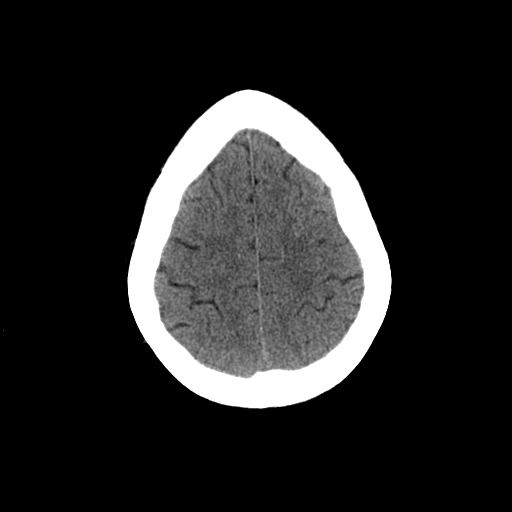
[im 24/31  brain]
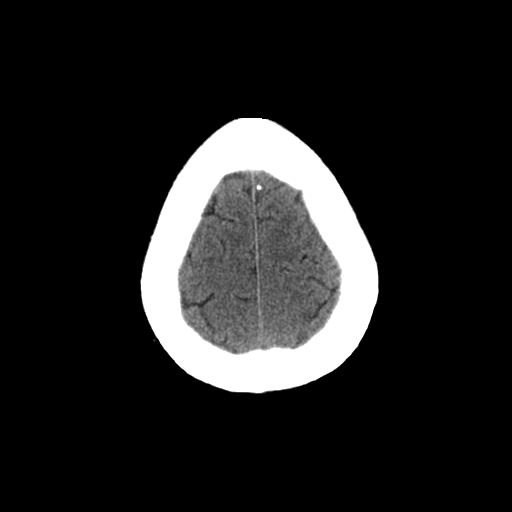
[im 26/31  brain]
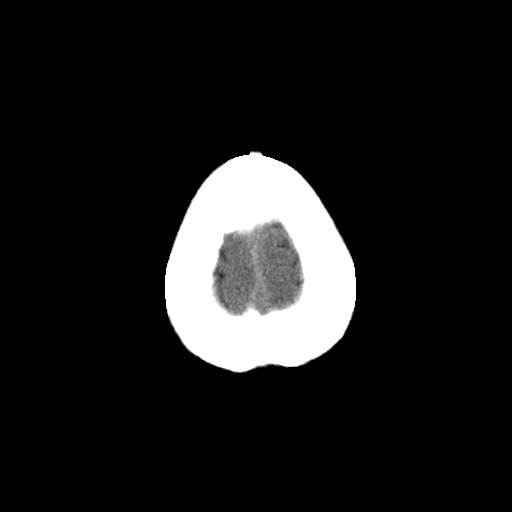
[im 28/31  brain]
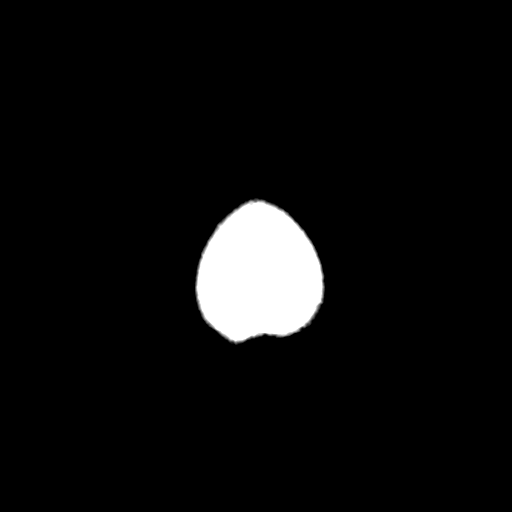
[im 28/31  bone]
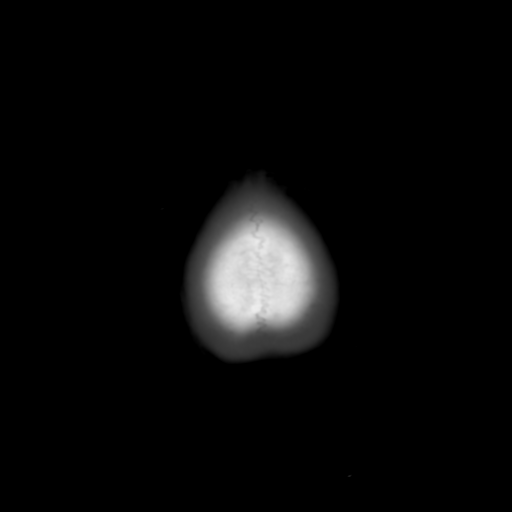

[Series 3: head w/o bone · axial · non-contrast · 0.49mm/px · z∈[+49,+89]mm · 3 of 31 slices shown]
[im 3/31  bone]
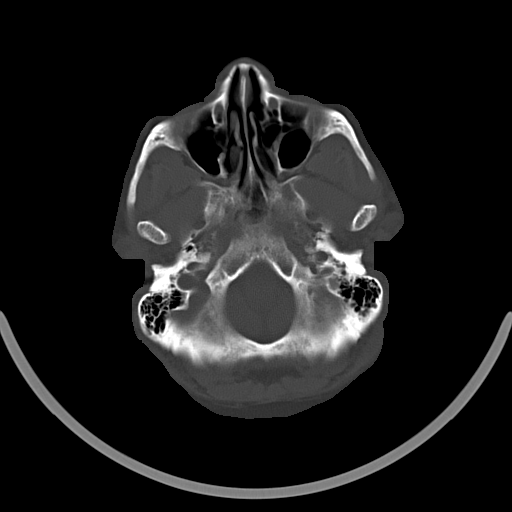
[im 7/31  bone]
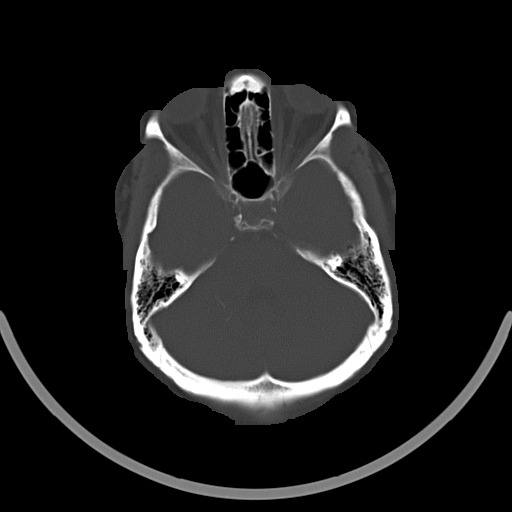
[im 11/31  bone]
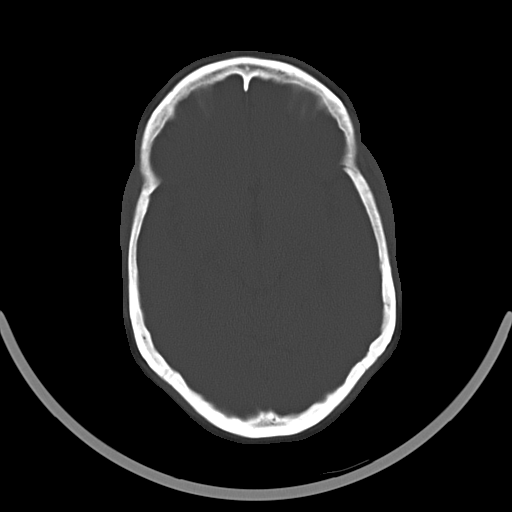

[16 of 30 positions shown; findings below may reference images not displayed]

FINDINGS: No mass lesion. No midline shift. No acute hemorrhage or hematoma.
No extra-axial fluid collections. No evidence of acute infarction.
There benign calcifications in the basal ganglia and cerebellar
hemispheres, stable. Tiny old lacunar infarct in left basal ganglia,
unchanged. No osseous abnormality.
IMPRESSION: No acute abnormalities.

## 2015-04-18 ENCOUNTER — Telehealth: Payer: Self-pay | Admitting: Internal Medicine

## 2015-04-18 IMAGING — CR DG CHEST 1V PORT
1 series · 1 of 1 positions shown · non-contrast
Comparison: 03/21/2013

CLINICAL DATA: Right-sided chest pain and cough

EXAM:
PORTABLE CHEST - 1 VIEW

[AP]
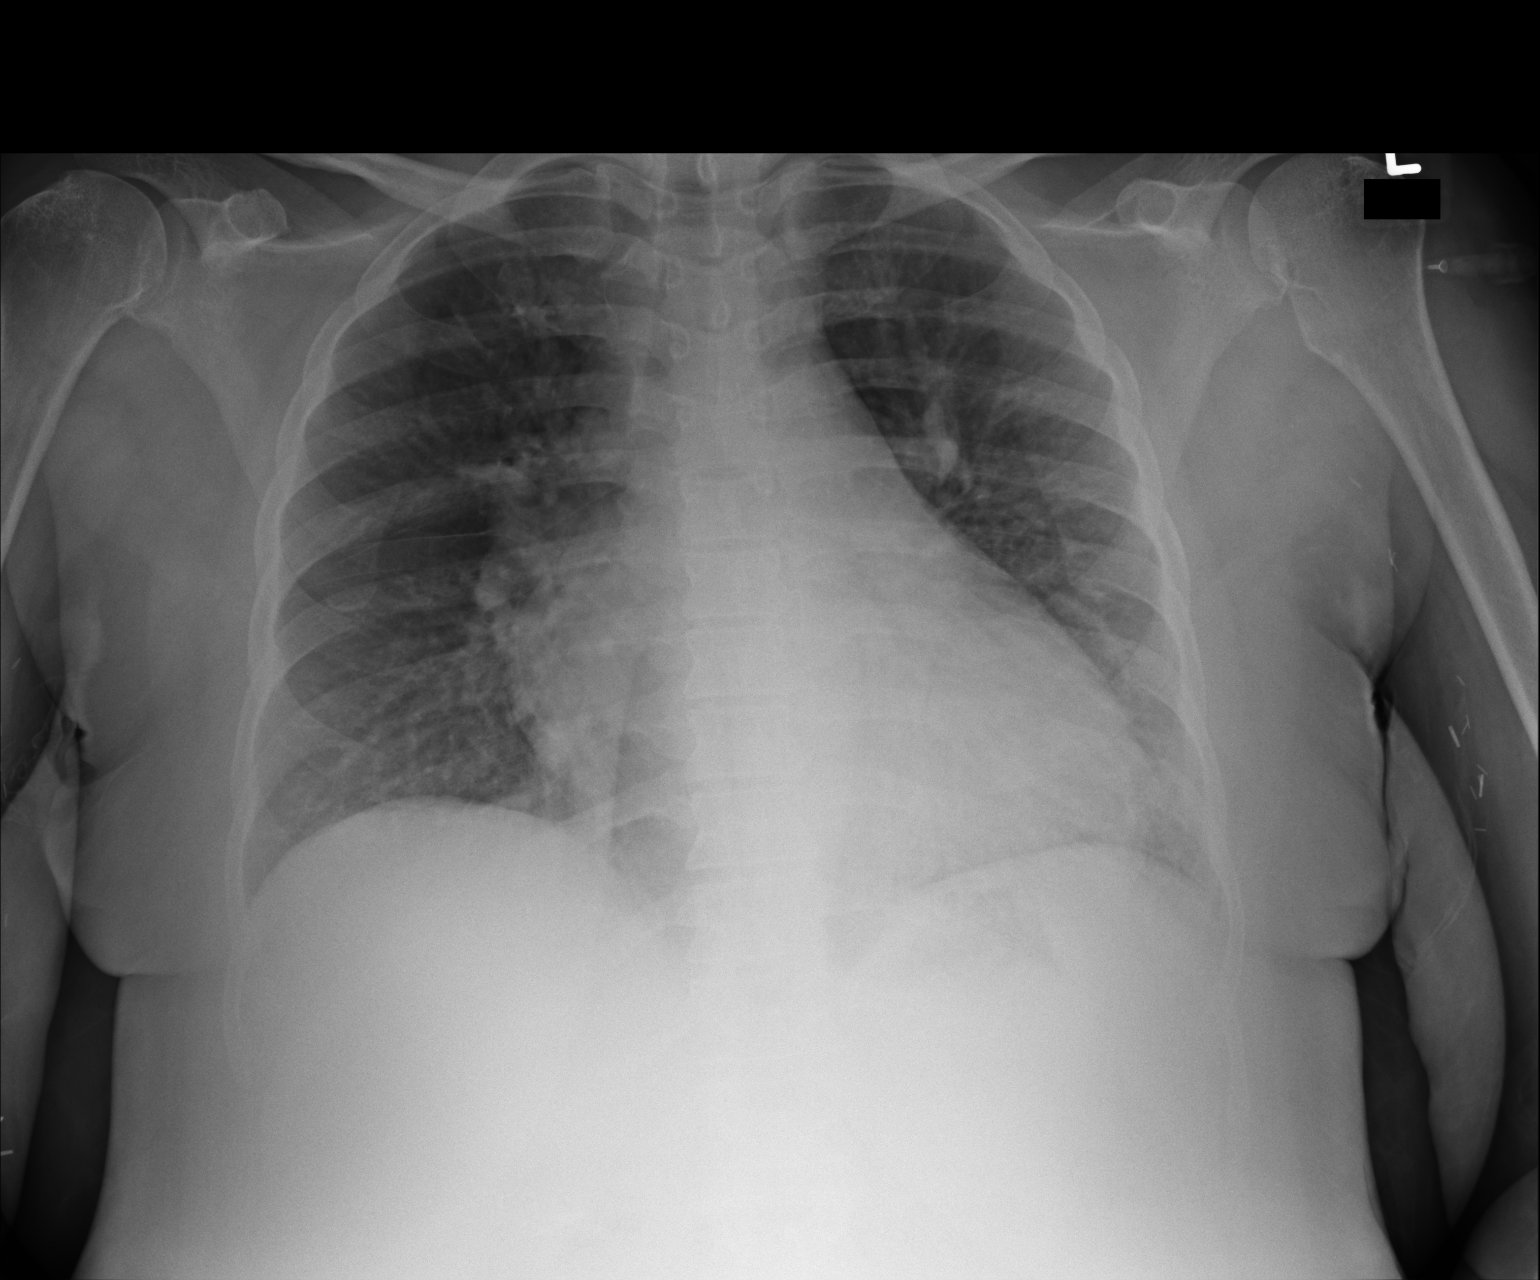

[1 of 1 positions shown; findings below may reference images not displayed]

FINDINGS: There is moderate cardiomegaly with a few interstitial [place]
B-lines and trace pleural effusions. Central vascular congestion
noted. No focal pulmonary opacity. No acute osseous finding.
IMPRESSION: Cardiomegaly with central vascular congestion and probable trace
interstitial edema.

## 2015-04-18 NOTE — Telephone Encounter (Signed)
[name] was recently discharged on 04/12/15. Please read my discharge summary for further details about the hospitalization. I spoke to interventional radiology today and they are recommending an outpatient diagnostic mammogram for further workup of the pectoral mass.   I already discussed the patient's biopsy results with her previously. I also tried calling her again today to tell her about the diagnostic mammogram but could not reach her over the phone.  I have routed the discharge summary to the patient's PCP. In addition, I called and spoke to her PCP Dr. Elenora Gamma over the phone on 04/18/15 at 3:05 pm. I requested her to give the patient a referral for diagnostic mammogram for further workup.

## 2015-04-28 IMAGING — US US MISC SOFT TISSUE
1 series · 14 of 16 positions shown · non-contrast
Comparison: None

CLINICAL DATA: Right thigh graft pain for several weeks. Assess for
infection or abscess.

EXAM:
LOWER EXTREMITY LIMITED SOFT TISSUE ULTRASOUND.
TECHNIQUE: Ultrasound examination of the lower extremity soft tissues was
performed in the area of clinical concern.

[Series 1: us misc soft tissue · 0.06mm/px · 14 of 23 slices shown]
[im 1/23]
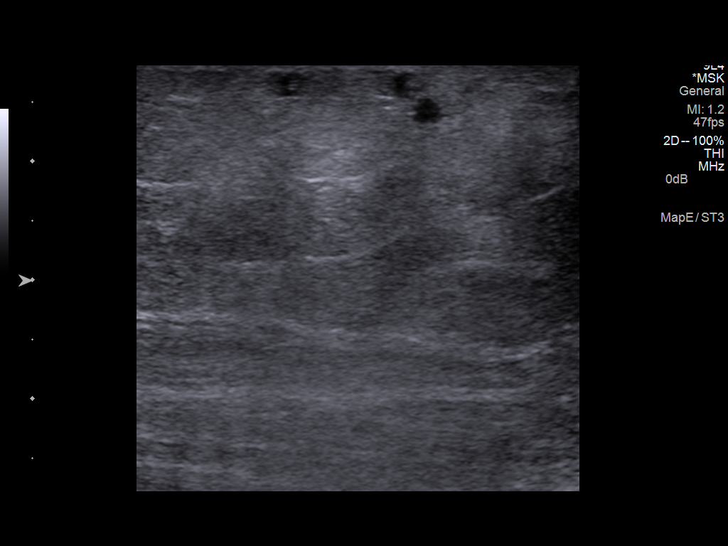
[im 2/23]
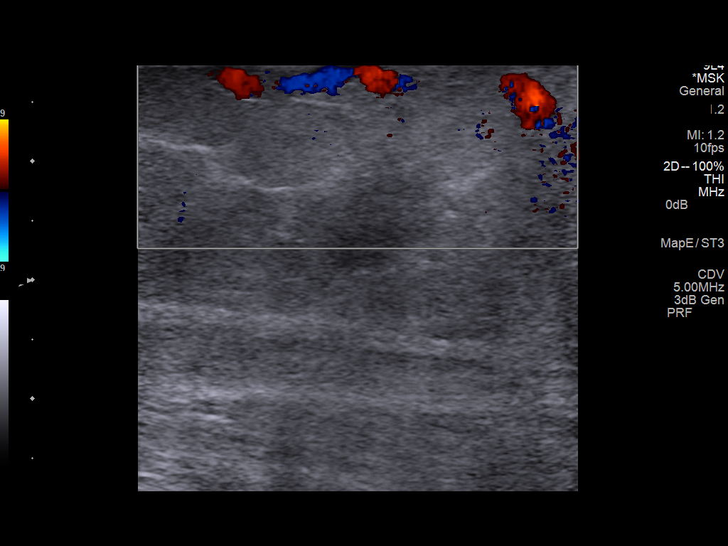
[im 3/23]
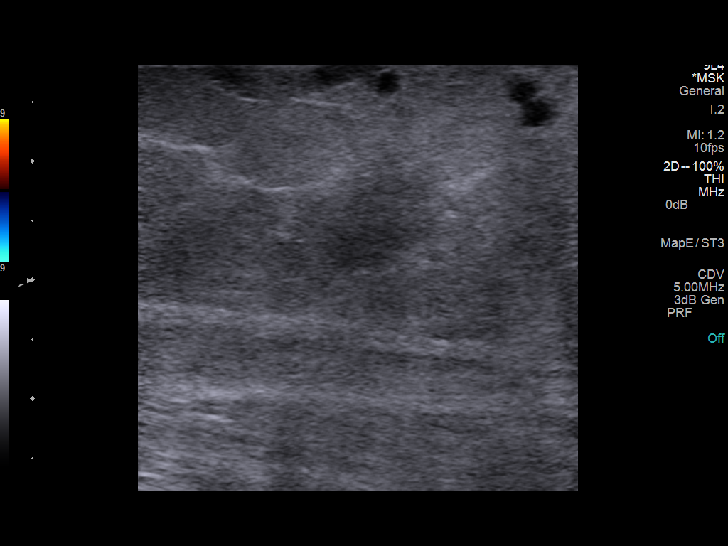
[im 6/23]
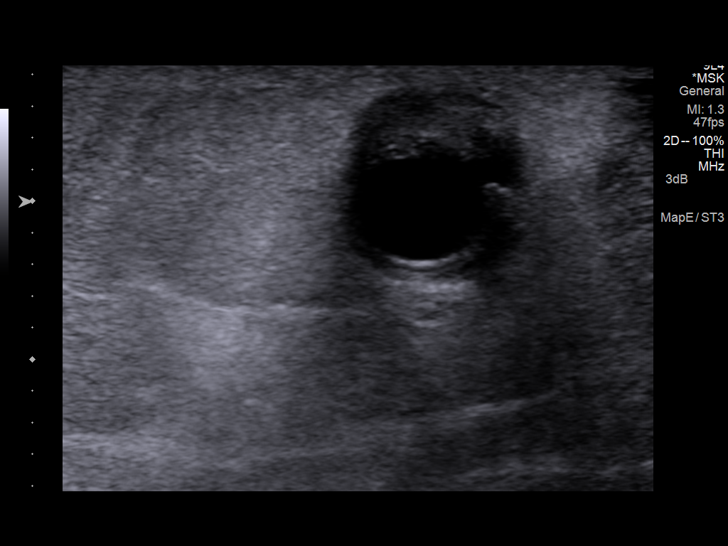
[im 8/23]
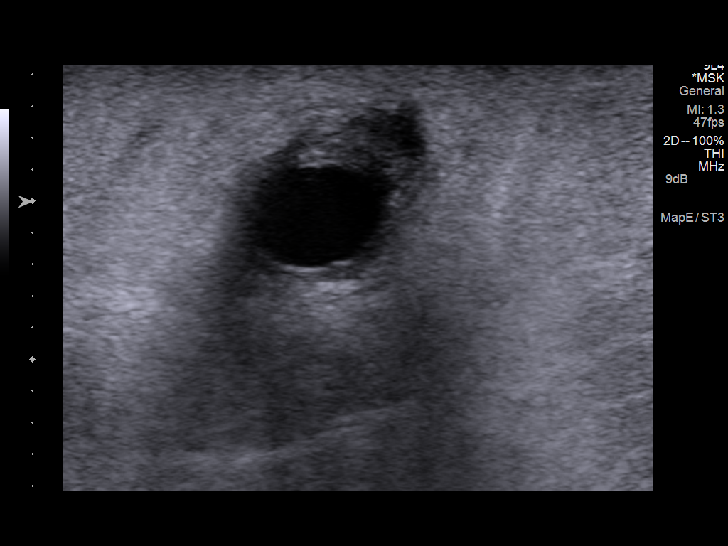
[im 9/23]
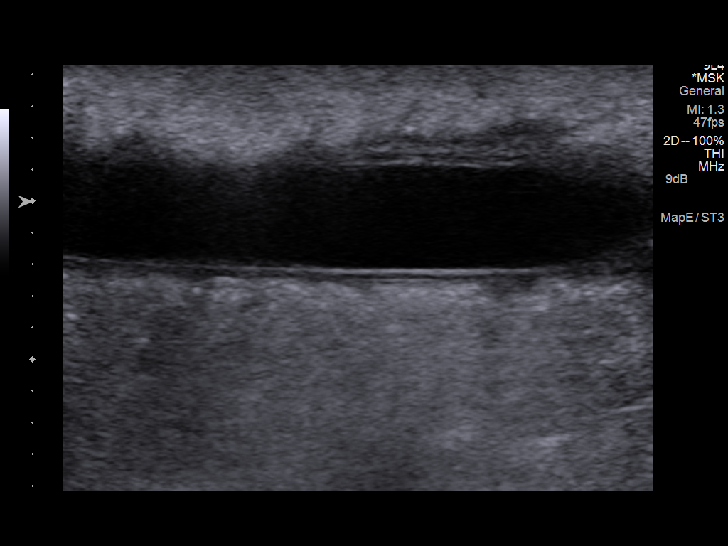
[im 11/23]
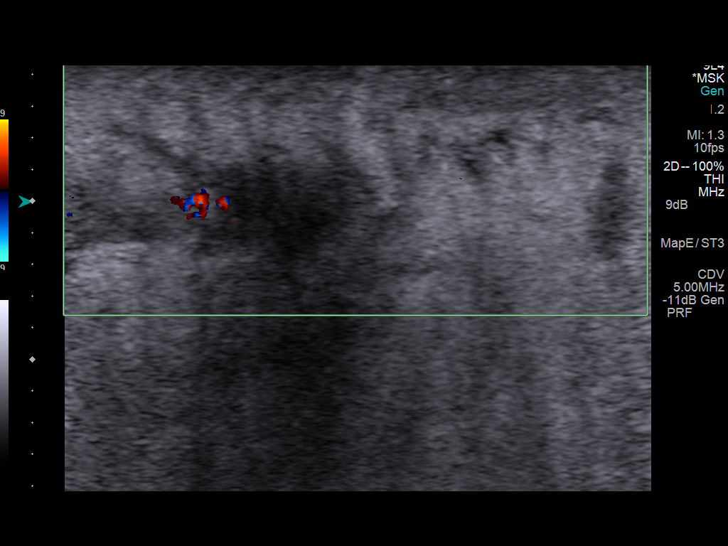
[im 12/23]
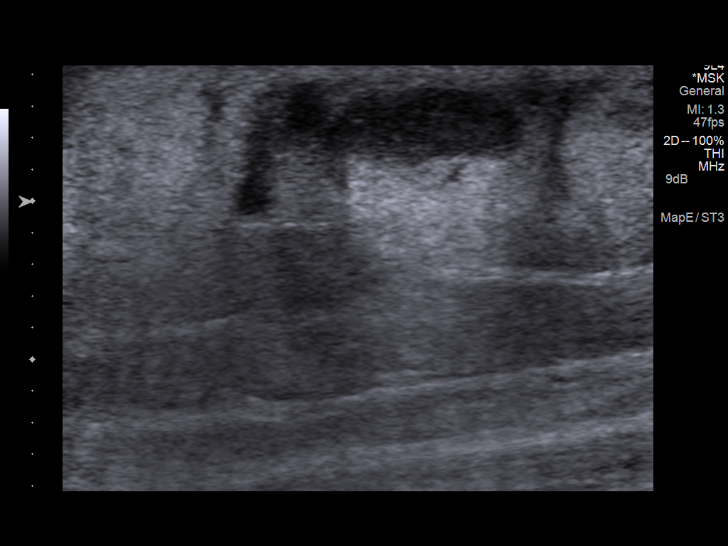
[im 14/23]
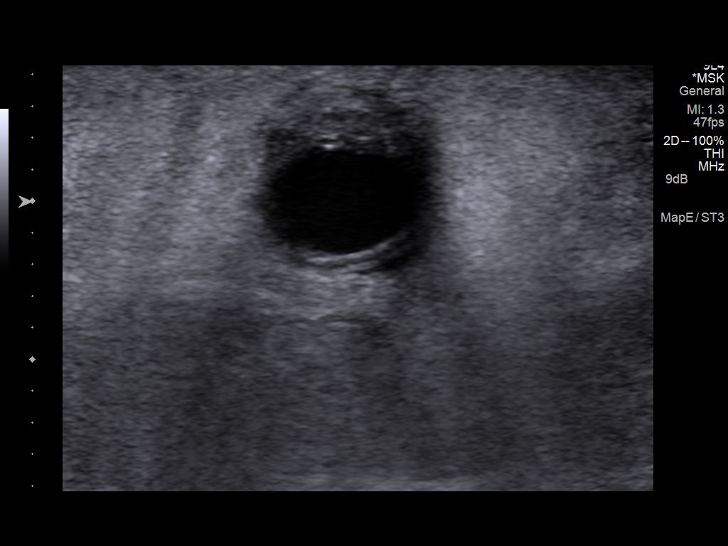
[im 15/23]
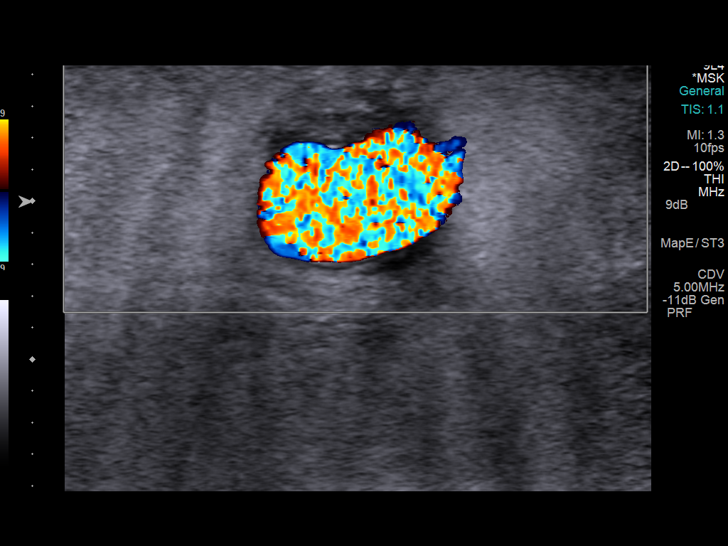
[im 18/23]
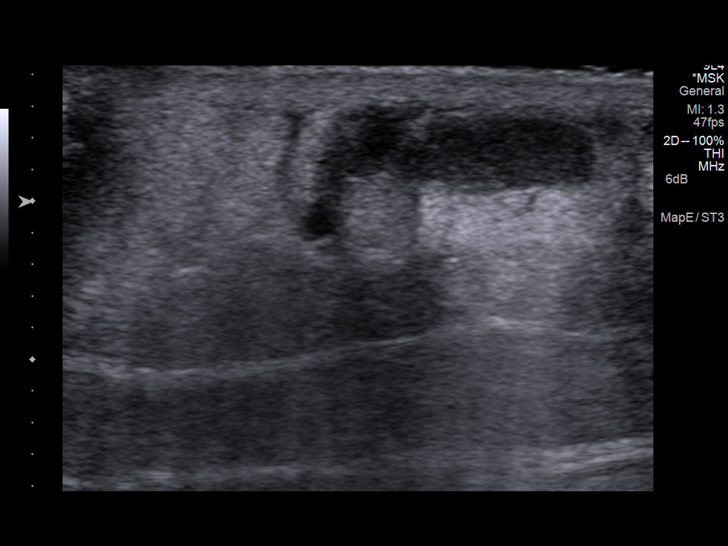
[im 20/23]
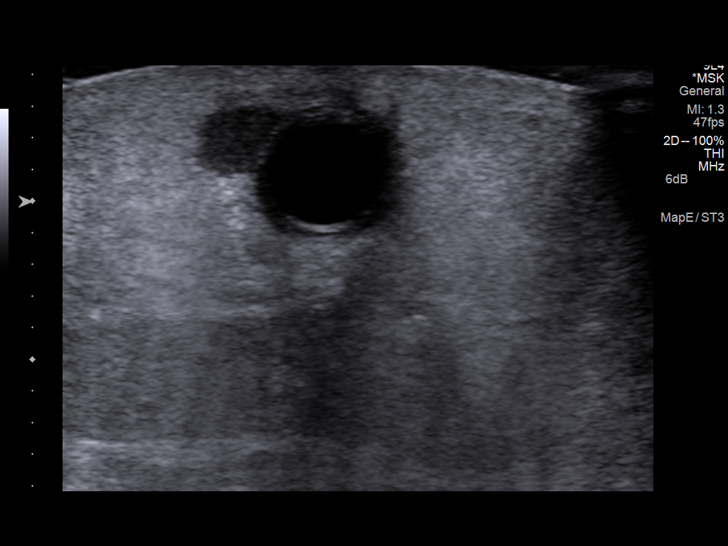
[im 21/23]
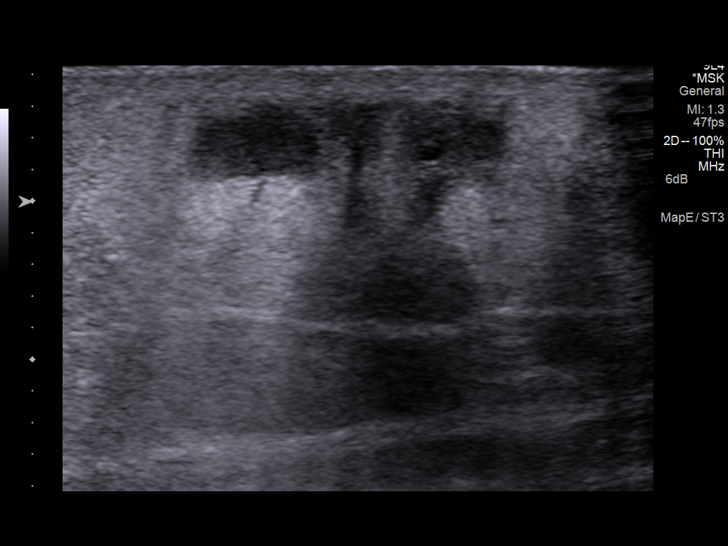
[im 23/23]
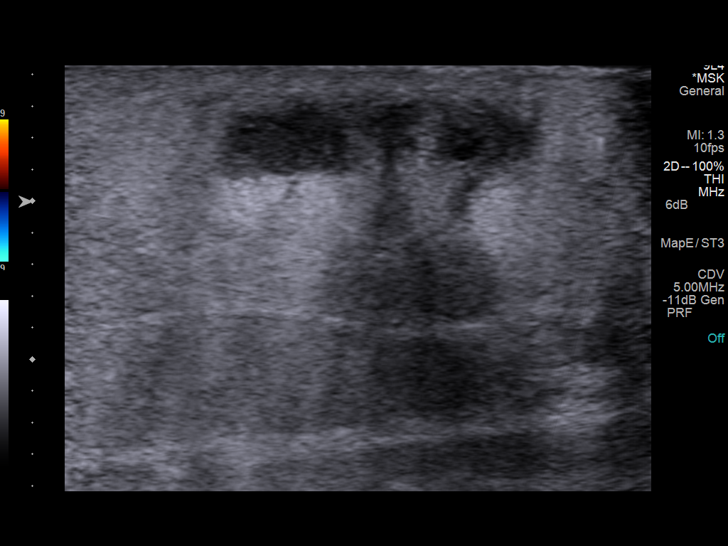

[14 of 16 positions shown; findings below may reference images not displayed]

FINDINGS: A focus of decreased echogenicity along the lateral aspect of the
superior graft is thought to reflect vague fluid in association with
soft tissue edema, without definite evidence of a well-defined
abscess. There is also additional soft tissue edema adjacent to the
graft, along the anterior aspect of the inferior medial graft. Wall
plaque is noted along the graft; it remains fully patent.
IMPRESSION: Vague focus of fluid in association with mild soft tissue edema
about the patient's right thigh graft, without definite evidence of
a well-defined abscess. Underlying infection cannot be entirely
excluded, given the patient's symptoms.

## 2015-05-02 ENCOUNTER — Encounter (HOSPITAL_COMMUNITY): Payer: Self-pay | Admitting: *Deleted

## 2015-05-02 ENCOUNTER — Emergency Department (INDEPENDENT_AMBULATORY_CARE_PROVIDER_SITE_OTHER)
Admission: EM | Admit: 2015-05-02 | Discharge: 2015-05-02 | Disposition: A | Discharge status: Home / Self Care | Payer: Medicare Other | Source: Home / Self Care | Attending: Family Medicine | Admitting: Family Medicine

## 2015-05-02 ENCOUNTER — Emergency Department (INDEPENDENT_AMBULATORY_CARE_PROVIDER_SITE_OTHER): Payer: Medicare Other

## 2015-05-02 DIAGNOSIS — S40011A Contusion of right shoulder, initial encounter: Secondary | ICD-10-CM

## 2015-05-02 NOTE — Discharge Instructions (Signed)
Ice, sling and tylenol as needed, see orthopedist if further problems.

## 2015-05-02 NOTE — ED Notes (Signed)
Large  r  Arm  Sling  Applied

## 2015-05-02 NOTE — ED Notes (Signed)
Pt  Reports   She  Injured  Her  r  Shoulder   She  Reports  She  Felled  sev   Weeks  Ago  And  Injured  The  Affected shoulder    She  Has  Pain when  She  Moves  it

## 2015-05-02 NOTE — ED Provider Notes (Signed)
CSN: [id number]     Arrival date & time 05/02/15  1828 History   First MD Initiated Contact with Patient 05/02/15 1948     Chief Complaint  Patient presents with  . Shoulder Injury   (Consider location/radiation/quality/duration/timing/severity/associated sxs/prior Treatment) Patient is a 44 y.o. female presenting with shoulder injury. The history is provided by the patient.  Shoulder Injury This is a new problem. The current episode started more than 1 week ago. The problem has not changed since onset.Associated symptoms comments: Told by relative that shoulder could be dislocated..    Past Medical History  Diagnosis Date  . Hyperlipidemia   . Alopecia   . Obesity   . Iron deficiency   . RLS (restless legs syndrome)   . Injury of conjunctiva and corneal abrasion of right eye without foreign body   . Cellulitis   . Anemia   . Blood transfusion 2004    "when I had my transplant"  . Migraine   . Hyperparathyroidism, secondary ([place])   . Diabetic retinopathy   . Hypertension     takes Metoprolol and Exforge daily  . Depression     takes Klonopin nightly  . Diabetes mellitus type 1 (HCC)     Pt states diagnosed at age 42 with prior episodes of DKA. S/p pancreatic transplant   . GERD (gastroesophageal reflux disease)     takes Protonix daily  . Bronchitis   . Migraine   . Asthma   . Emphysema of lung ([place])   . Angina     none in past 2 years.  . Stomach pain     Hx: of chronic  . Pneumonia 2012    "double"  . Dialysis patient ([place])     tues,thurs,sat  . 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  Surgery was done at [facility].     . DKA (diabetic ketoacidoses) ([place]) 12/01/2014  . Diastolic CHF, acute on chronic ([place]) 01/02/2015  . Stroke [facility]) 03/2014    ? TIA  .  ESRD (end stage renal disease) [facility])     S/p pancreatic and kidney transplant, but back on HD 2008, dialysis at [place], Sat   Past Surgical History  Procedure Laterality Date  . 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 Thyroglobulin      x 7  . Av fistula placement      right upper arm  . Cholecystectomy  1995  .  hd graft placement/removal      "had 2 in my left upper arm"  . Eye surgery    . Retinopathy surgery    . Tooth extraction  10/10/11    X's two  . Insertion of dialysis catheter  12/08/2011    Procedure: INSERTION OF DIALYSIS CATHETER;  Surgeon: Angelia Mould, MD;  Location: [place];  Service: Vascular;  Laterality: Left;  . Av fistula placement  01/22/2012    Procedure: INSERTION OF ARTERIOVENOUS (AV) GORE-TEX GRAFT ARM;  Surgeon: Angelia Mould, MD;  Location: [place];  Service: Vascular;  Laterality: Left;  Ultrasound guided.  . Av fistula placement  03/14/2012    Procedure: INSERTION OF ARTERIOVENOUS (AV) GORE-TEX GRAFT THIGH;  Surgeon: Angelia Mould, MD;  Location: [place];  Service: Vascular;  Laterality: Right;  . False aneurysm repair Right 01/02/2013    Procedure: Excision of lymphocele in right thigh.;  Surgeon: Angelia Mould, MD;  Location: [place];  Service: Vascular;  Laterality: Right;  . Carpal tunnel release Right 03/02/2013    Procedure: CARPAL TUNNEL RELEASE;  Surgeon: Tennis Must, MD;  [place];  Service: Orthopedics;  Laterality: Right;  . Flexible sigmoidoscopy N/A 03/24/2013    Procedure: FLEXIBLE SIGMOIDOSCOPY;  Surgeon: Cleotis Nipper, MD;  Location: [facility] ENDOSCOPY;  Service: Endoscopy;  Laterality: N/A;  . Revision of arteriovenous goretex graft Right 04/02/2013    Procedure: REPAIR OF PSEUDOANEURYSM OF ARTERIOVENOUS GORETEX GRAFT;  Surgeon: Angelia Mould, MD;  Location: [place];  Service: Vascular;  Laterality: Right;  . Carpal tunnel release  Left 10/16/2013    Procedure: LEFT CARPAL TUNNEL RELEASE;  Surgeon: Tennis Must, MD;  Location: [place];  Service: Orthopedics;  Laterality: Left;  . Unilateral upper extremeity angiogram N/A 11/12/2011    Procedure: UNILATERAL UPPER Anselmo Rod;  Surgeon: Angelia Mould, MD;  Location: [facility] CATH LAB;  Service: Cardiovascular;  Laterality: N/A;  . Fistulogram Left 03/03/2012    Procedure: FISTULOGRAM;  Surgeon: Angelia Mould, MD;  Location: [facility] CATH LAB;  Service: Cardiovascular;  Laterality: Left;  . Av fistula placement Left 01/14/2015    Procedure: INSERTION OF LEFT ARTERIOVENOUS (AV) GORE-TEX GRAFT THIGH;  Surgeon: Angelia Mould, MD;  Location: [place];  Service: Vascular;  Laterality: Left;  . Tee without cardioversion N/A 04/05/2015    Procedure: TRANSESOPHAGEAL ECHOCARDIOGRAM (TEE);  Surgeon: Skeet Latch, MD;  Location: [facility] ENDOSCOPY;  Service: Cardiovascular;  Laterality: N/A;  . Lymph node biopsy Right 04/11/2015    Procedure: RIGHT SUPRACLAVICULAR LYMPH NODE BIOPSY;  Surgeon: Melrose Nakayama, MD;  Location: [place];  Service: Vascular;  Laterality: Right;   Family History  Problem Relation Age of Onset  . Hypertension Mother   . Kidney disease Mother   . Diabetes Mother   . Hypertension Father   . Kidney disease Father   . Diabetes Father    Social History  Substance Use Topics  . Smoking status: Never Smoker   . Smokeless tobacco: Never Used  . Alcohol Use: No   OB History    Gravida Para Term Preterm AB TAB SAB Ectopic Multiple Living   1              Review of Systems  Constitutional: Negative.   Musculoskeletal: Positive for joint swelling. Negative for back pain and gait problem.  Skin: Negative.   All other systems reviewed and are negative.   Allergies  Depakote; Morphine and related; Penicillins; Tramadol; Imitrex; and Vicodin  Home Medications   Prior to Admission medications   Medication Sig Start Date  End Date Taking? Authorizing Provider  albuterol (PROVENTIL HFA;VENTOLIN HFA) 108 (90 BASE) MCG/ACT inhaler Inhale 2 puffs into the lungs every 4 (four) hours as needed for wheezing or shortness of breath (as per home regimen). 02/20/13   Kinnie Feil, MD  amLODipine (NORVASC) 10 MG tablet Take 10 mg by mouth at bedtime.  11/17/14   Historical Provider, MD  aspirin 325 MG tablet Take 1 tablet (325 mg total) by mouth daily. 05/20/14   Velvet Bathe, MD  calcium acetate (PHOSLO) 667 MG capsule Take 1 capsule (667 mg total) by mouth 2 (two) times daily. Patient taking differently: Take 2,001 mg by mouth 3 (three) times  daily with meals.  11/16/14   Theodis Blaze, MD  clonazePAM (KLONOPIN) 0.5 MG tablet Take 1 mg by mouth at bedtime.     Historical Provider, MD  Darbepoetin Alfa (ARANESP) 40 MCG/0.4ML SOSY injection Inject 0.4 mLs (40 mcg total) into the vein every Thursday with hemodialysis. 08/05/14   Mechele Claude, DO  doxercalciferol (HECTOROL) 4 MCG/2ML injection Inject 1 mL (2 mcg total) into the vein Every Tuesday,Thursday,and Saturday with dialysis. 08/05/14   Mechele Claude, DO  gabapentin (NEURONTIN) 100 MG capsule Take 100 mg by mouth daily.     Historical Provider, MD  guaiFENesin (MUCINEX) 600 MG 12 hr tablet Take 1 tablet (600 mg total) by mouth 2 (two) times daily. 01/07/15   Barton Dubois, MD  insulin glargine (LANTUS) 100 UNIT/ML injection Inject 0.07 mLs (7 Units total) into the skin 2 (two) times daily. 08/05/14   Mechele Claude, DO  insulin lispro (HUMALOG KWIKPEN) 100 UNIT/ML KiwkPen Inject 6 Units into the skin 2 (two) times daily.     Historical Provider, MD  lidocaine (LIDODERM) 5 % Place 1 patch onto the skin daily. Remove & Discard patch within 12 hours or as directed by MD 12/06/14   Kelvin Cellar, MD  metoCLOPramide (REGLAN) 10 MG tablet Take 1 tablet (10 mg total) by mouth 4 (four) times daily -  before meals and at bedtime. 12/06/14   Kelvin Cellar, MD    metoprolol succinate (TOPROL-XL) 50 MG 24 hr tablet Take 1 tablet (50 mg total) by mouth daily. Take with or immediately following a meal. 01/07/15   Barton Dubois, MD  montelukast (SINGULAIR) 10 MG tablet Take 10 mg by mouth at bedtime.    Historical Provider, MD  multivitamin (RENA-VIT) TABS tablet Take 1 tablet by mouth daily. 08/05/14   Mechele Claude, DO  Nutritional Supplements (FEEDING SUPPLEMENT, NEPRO CARB STEADY,) LIQD Take 237 mLs by mouth 2 (two) times daily between meals. 04/23/14   Orson Eva, MD  Oxycodone HCl 10 MG TABS Take 10 mg by mouth 3 (three) times daily as needed (for pain).  03/23/15   Historical Provider, MD  oxyCODONE-acetaminophen (PERCOCET/ROXICET) 5-325 MG tablet Take 1-2 tablets by mouth every 6 (six) hours as needed for severe pain. 03/02/15   Angelia Mould, MD  pantoprazole (PROTONIX) 40 MG tablet Take 1 tablet (40 mg total) by mouth at bedtime. Patient taking differently: Take 40 mg by mouth daily. Takes daily in the afternoon 08/05/14   Mechele Claude, DO  promethazine (PHENERGAN) 25 MG tablet Take 1 tablet (25 mg total) by mouth every 6 (six) hours as needed for nausea or vomiting. 12/06/14   Kelvin Cellar, MD  simvastatin (ZOCOR) 20 MG tablet Take 1 tablet (20 mg total) by mouth at bedtime. 05/20/14   Velvet Bathe, MD  warfarin (COUMADIN) 5 MG tablet Take 0.5 tablets (2.5 mg total) by mouth daily at 6 PM. Patient taking differently: Take 5-7.5 mg by mouth See admin instructions. Take 1 tablet on Tuesday, Thursday and Saturday then take 1 and 1/2 tablets on Monday, Wednesday, Friday and Sunday 01/23/15   Ulyses Amor, PA-C  zolpidem (AMBIEN) 5 MG tablet Take 5 mg by mouth at bedtime as needed. sleep 02/09/14   Historical Provider, MD   Meds Ordered and Administered this Visit  Medications - No data to display  BP 161/89 mmHg  Pulse 117  Temp(Src) 98.1 F (36.7 C) (Oral)  Resp 16  SpO2 99%  LMP 04/28/2015 No data  found.   Physical Exam   Constitutional: She is oriented to person, place, and time. She appears well-developed and well-nourished.  Musculoskeletal: She exhibits tenderness.       Right shoulder: She exhibits decreased range of motion, tenderness, bony tenderness, swelling, pain, spasm and decreased strength. She exhibits no effusion, no crepitus, no deformity and normal pulse.       Arms: Neurological: She is alert and oriented to person, place, and time.  Skin: Skin is warm and dry.  Nursing note and vitals reviewed.   ED Course  Procedures (including critical care time)  Labs Review Labs Reviewed - No data to display  Imaging Review No results found.   Visual Acuity Review  Right Eye Distance:   Left Eye Distance:   Bilateral Distance:    Right Eye Near:   Left Eye Near:    Bilateral Near:         MDM   1. Shoulder contusion, right, initial encounter        Billy Fischer, MD 05/02/15 2019

## 2015-05-13 NOTE — ED Notes (Signed)
Patient had questions

## 2015-05-16 ENCOUNTER — Other Ambulatory Visit: Payer: Self-pay

## 2015-05-16 DIAGNOSIS — N6310 Unspecified lump in the right breast, unspecified quadrant: Secondary | ICD-10-CM

## 2015-05-25 IMAGING — CR DG CHEST 1V PORT
1 series · 1 of 1 positions shown · non-contrast
Comparison: Earlier today.

CLINICAL DATA: Fever.  Migraine headaches.

EXAM:
PORTABLE CHEST - 1 VIEW

[AP]
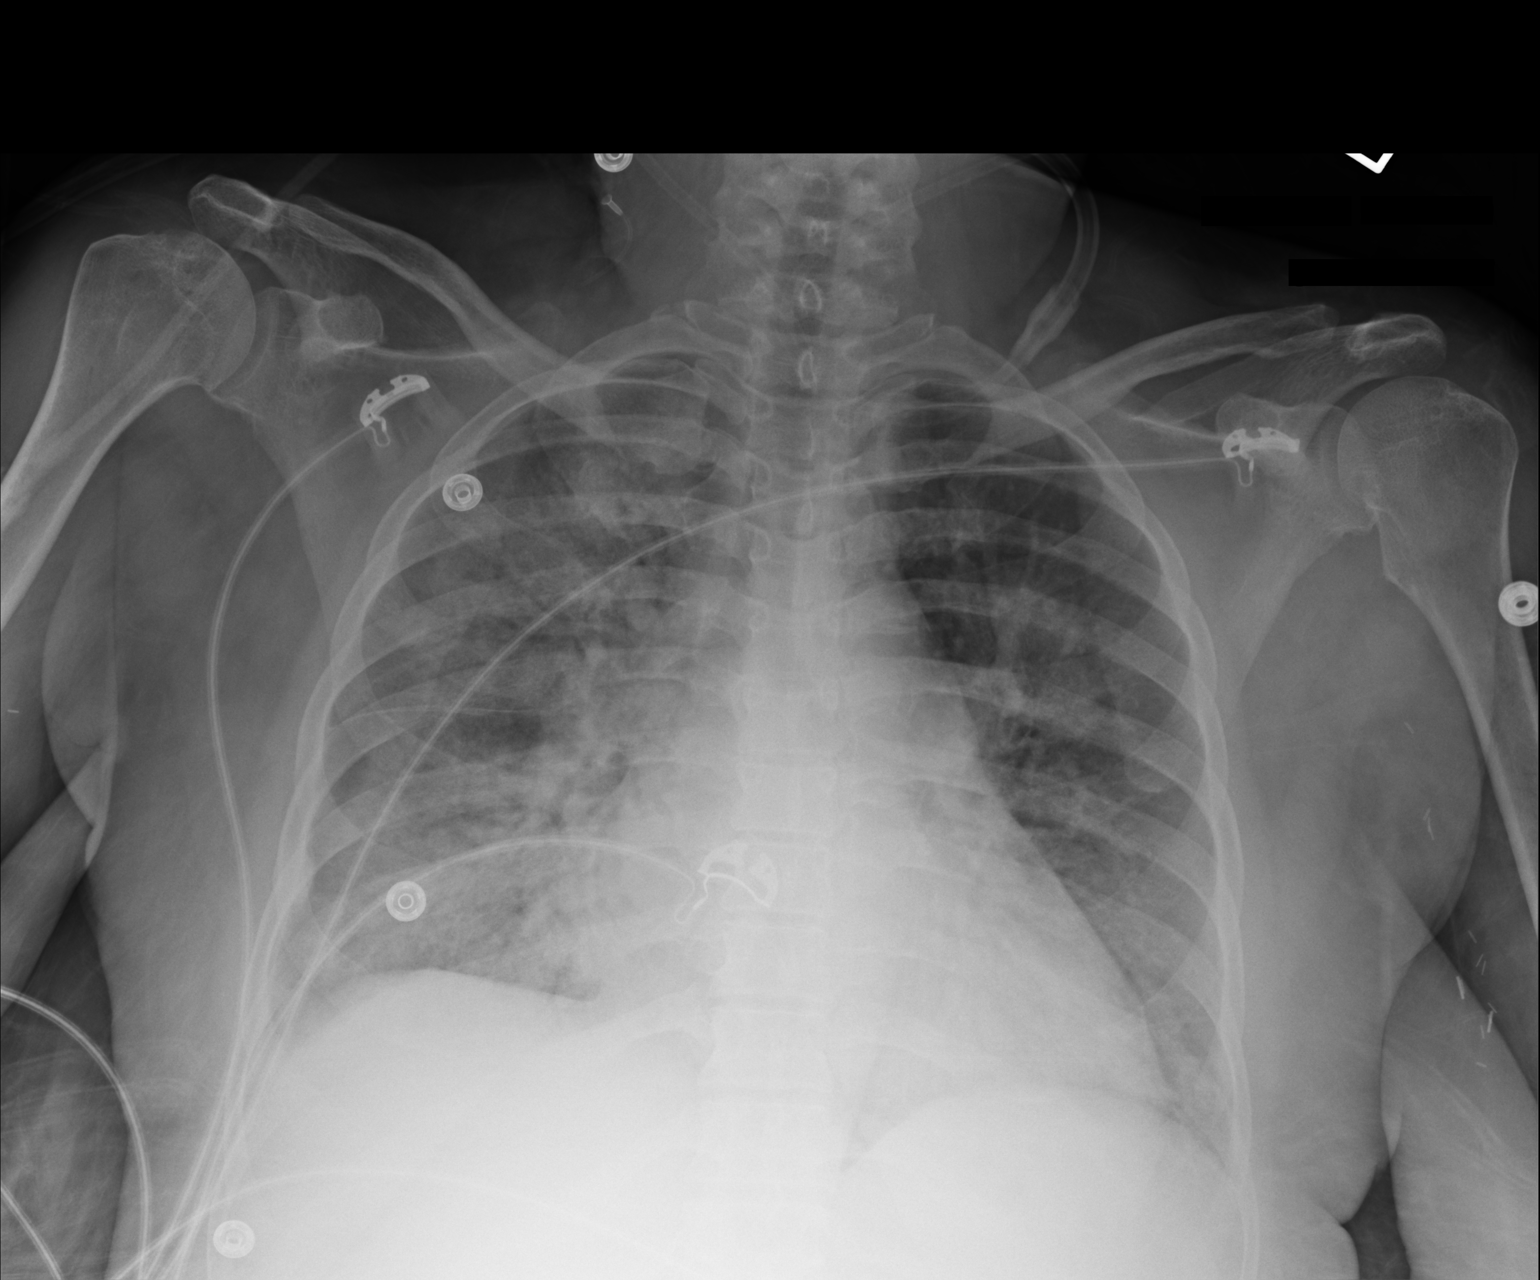

[1 of 1 positions shown; findings below may reference images not displayed]

FINDINGS: The cardiac silhouette remains mildly enlarged. Less dense airspace
consolidation throughout the right lung with no significant change
in mild airspace consolidation in the left lung. No visible pleural
fluid. Unremarkable bones.
IMPRESSION: Improving asymmetrical pulmonary edema or pneumonia with stable mild
cardiomegaly.

## 2015-05-25 IMAGING — CR DG CHEST 1V PORT
1 series · 1 of 1 positions shown · non-contrast
Comparison: Chest radiograph performed 04/06/2013

CLINICAL DATA: Shortness of breath.

EXAM:
PORTABLE CHEST - 1 VIEW

[AP]
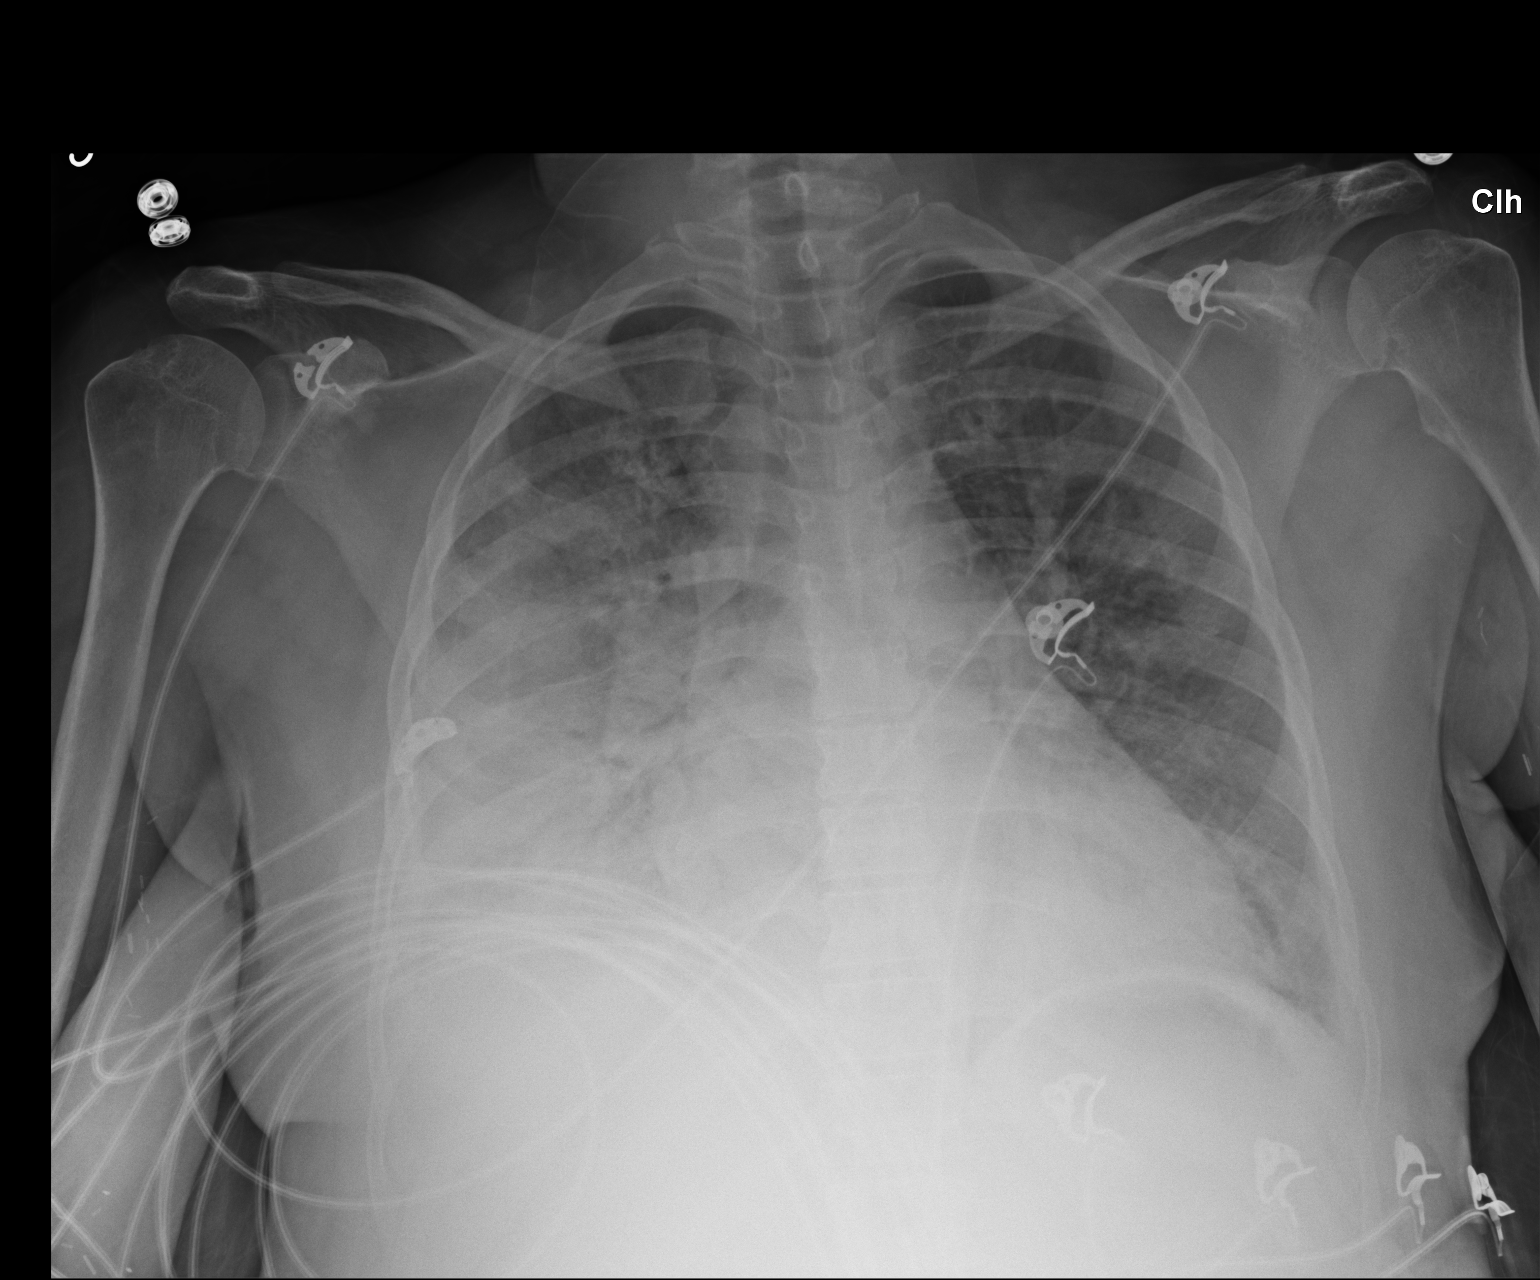

[1 of 1 positions shown; findings below may reference images not displayed]

FINDINGS: There is new right-sided airspace opacification, concerning for
pneumonia. Asymmetric pulmonary edema is considered less likely.
Mild left basilar airspace opacity is seen. A small right pleural
effusion is suspected. No pneumothorax is seen.

The cardiomediastinal silhouette is mildly enlarged. No acute
osseous abnormalities are identified.
IMPRESSION: 1. New diffuse right-sided airspace opacification, concerning for
pneumonia. Asymmetric pulmonary edema is considered less likely.
Mild left basilar airspace opacity also seen.
2. Suspect small right pleural effusion.
3. Mild cardiomegaly.

## 2015-05-28 IMAGING — CR DG CHEST 2V
2 series · 2 of 2 positions shown · non-contrast
Comparison: 05/03/2013

CLINICAL DATA: The patient was told she had pneumonia 2 days ago.
The patient is on antibiotics.

EXAM:
CHEST  2 VIEW

[w chest pa]
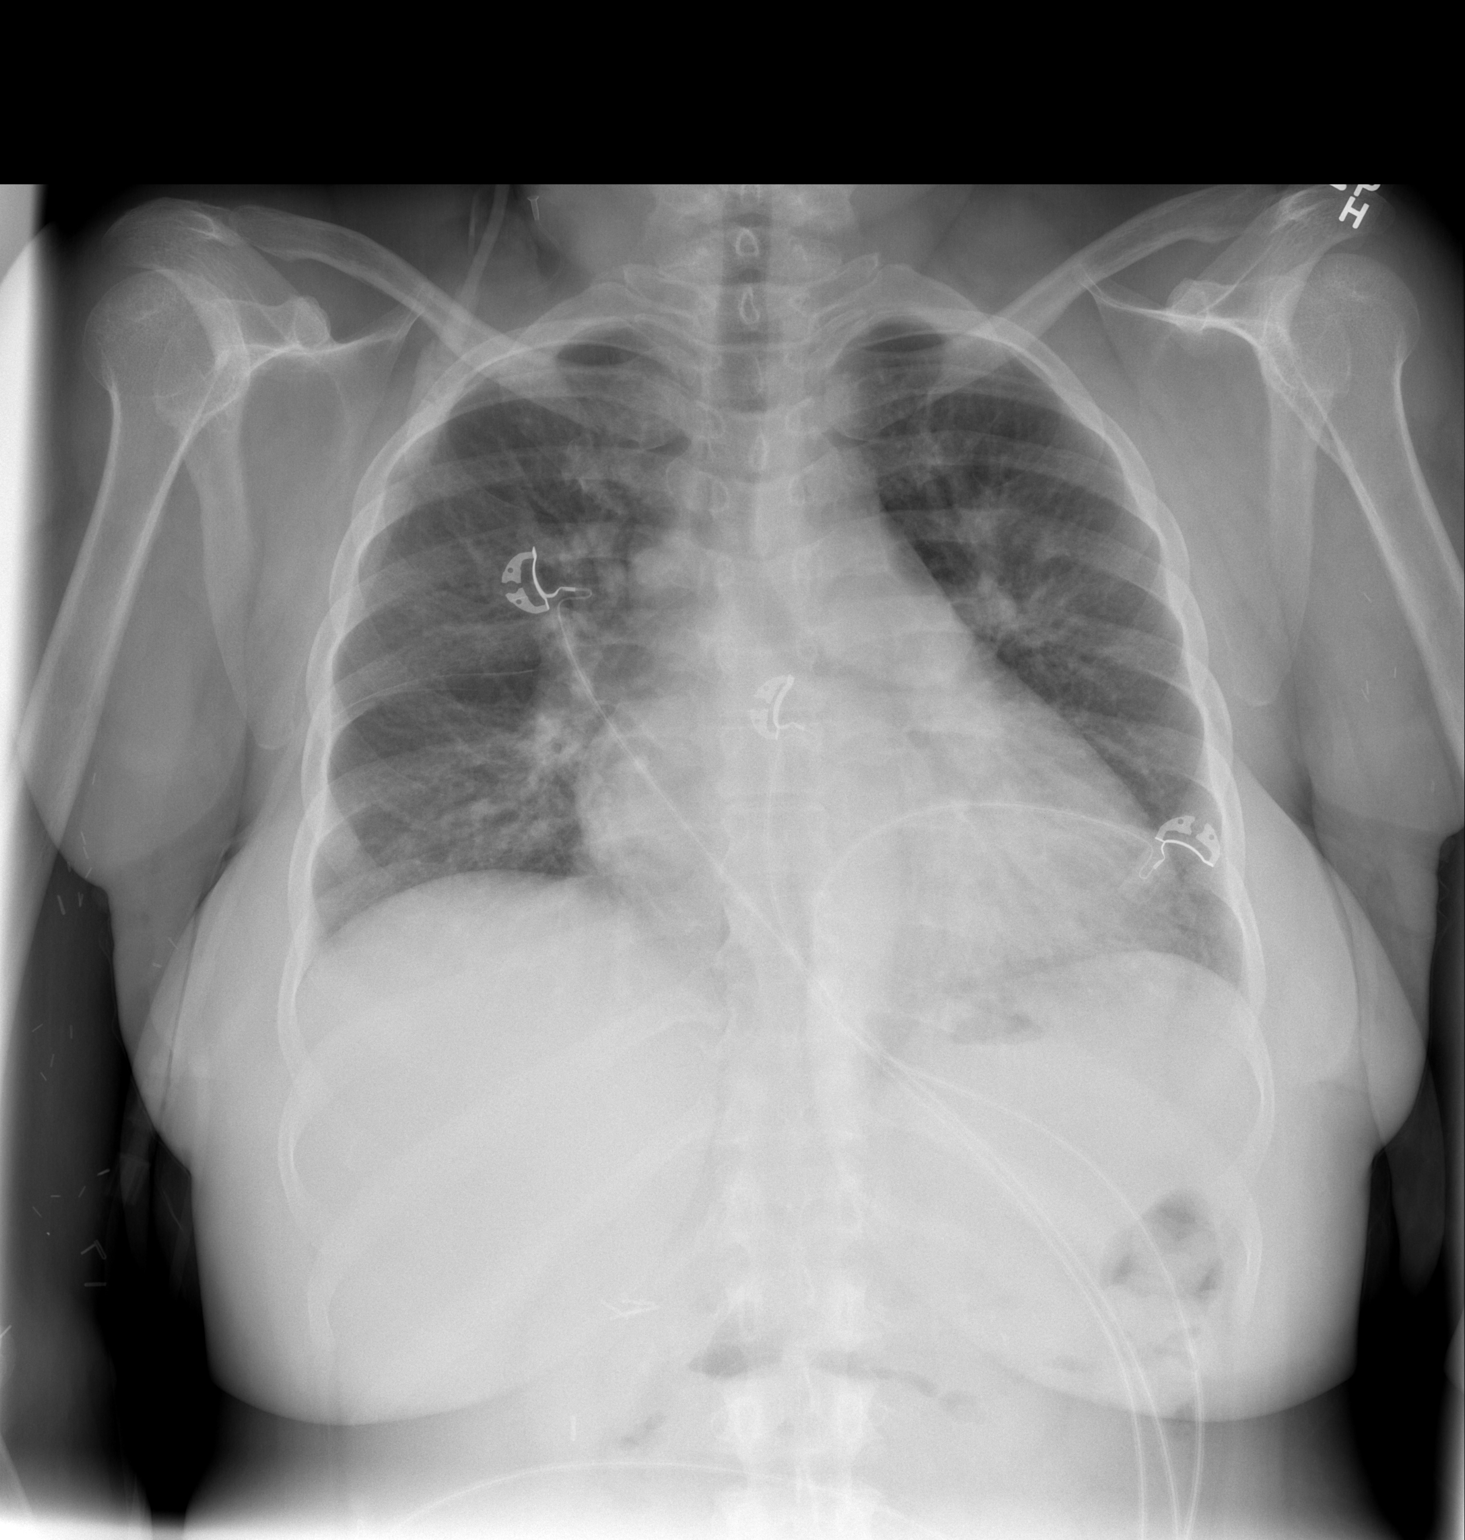

[w chest lat *]
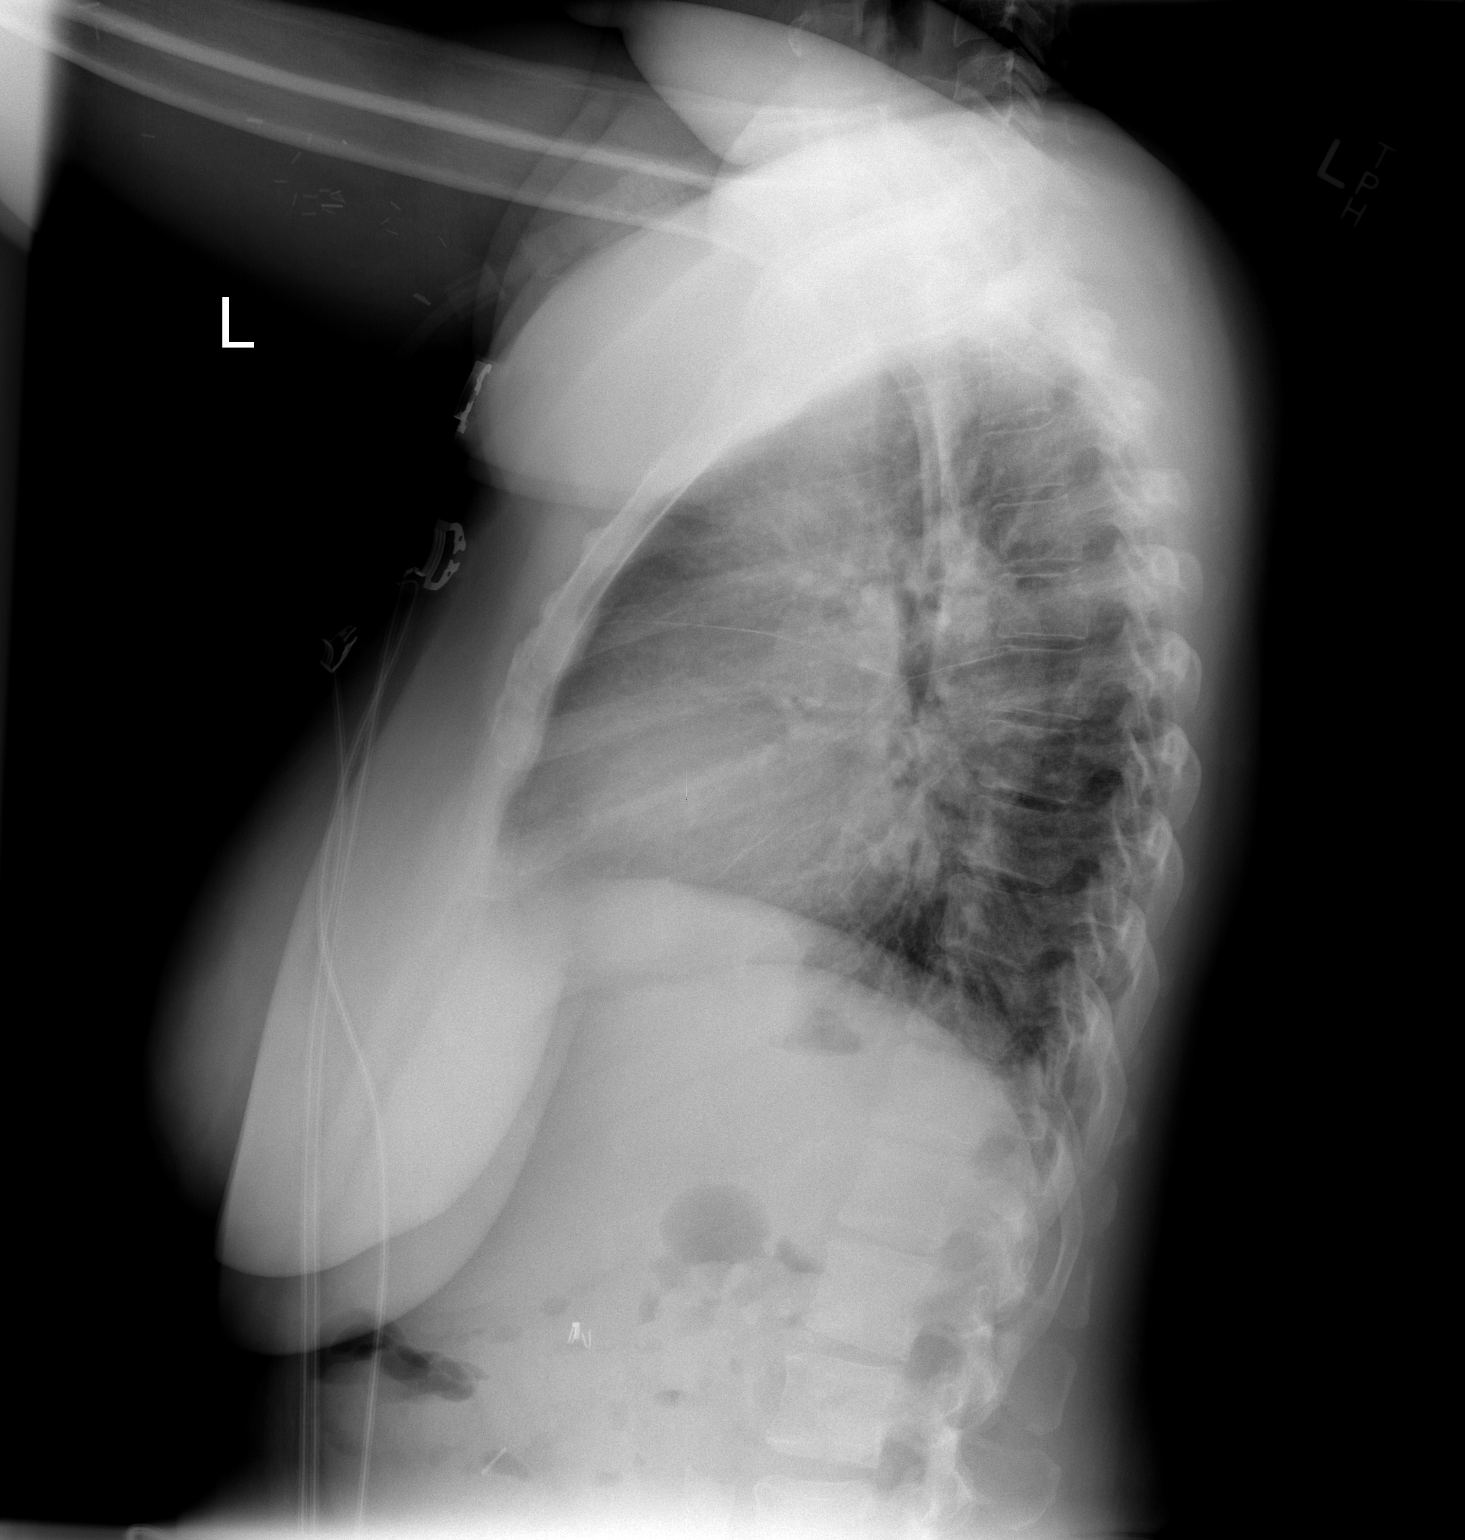

[2 of 2 positions shown; findings below may reference images not displayed]

FINDINGS: The heart is enlarged. There has been improvement in aeration of the
right lung. Persistent infiltrates are identified at the lung bases
bilaterally, left greater than right. Findings favor infectious
infiltrate over edema. Surgical clips are identified in the right
upper quadrant of the abdomen.
IMPRESSION: 1. Cardiomegaly without definite evidence for pulmonary edema.
2. Improving aeration with persistent bilateral lower lobe
infiltrates.

## 2015-06-22 ENCOUNTER — Other Ambulatory Visit: Payer: Self-pay | Admitting: Family Medicine

## 2015-06-22 DIAGNOSIS — N6310 Unspecified lump in the right breast, unspecified quadrant: Secondary | ICD-10-CM

## 2015-06-23 ENCOUNTER — Other Ambulatory Visit: Payer: Medicare Other

## 2015-06-29 ENCOUNTER — Other Ambulatory Visit: Payer: Medicare Other

## 2015-07-15 ENCOUNTER — Other Ambulatory Visit: Payer: Medicare Other

## 2015-07-19 IMAGING — CT CT HEAD W/O CM
2 series · 16 of 30 positions shown, 18 images · non-contrast
Comparison: 03/23/2013

CLINICAL DATA: Weakness, hypoglycemia

EXAM:
CT HEAD WITHOUT CONTRAST
TECHNIQUE: Contiguous axial images were obtained from the base of the skull
through the vertex without intravenous contrast.

[Series 2: head w/o · axial · non-contrast · 0.49mm/px · z∈[+117,+232]mm · 8 of 31 slices shown, 10 images]
[im 4/31  brain]
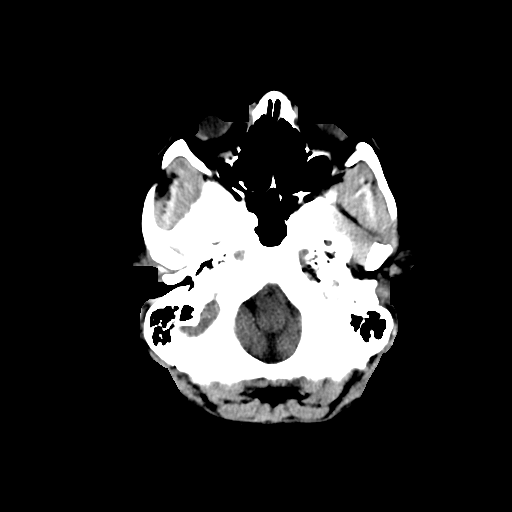
[im 4/31  bone]
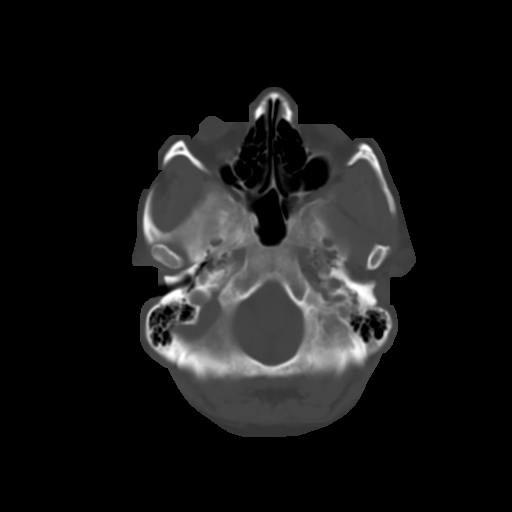
[im 7/31  brain]
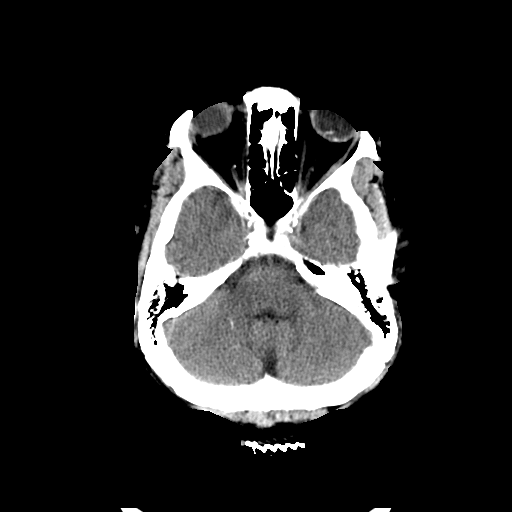
[im 11/31  brain]
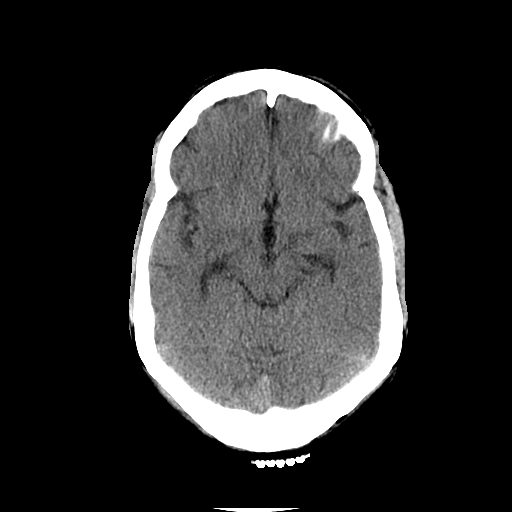
[im 14/31  brain]
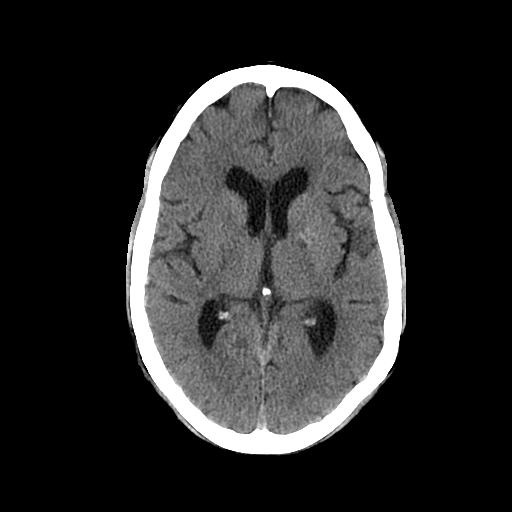
[im 17/31  brain]
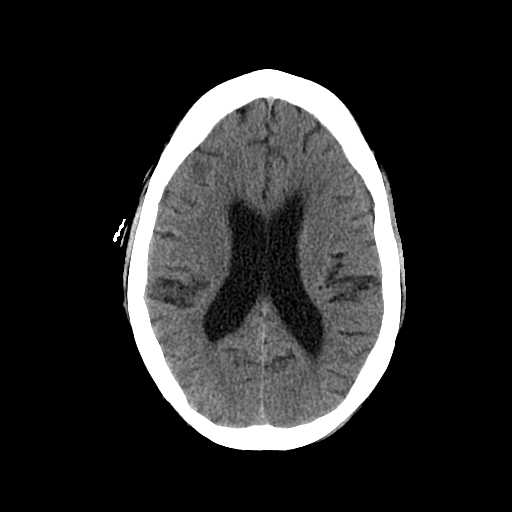
[im 17/31  bone]
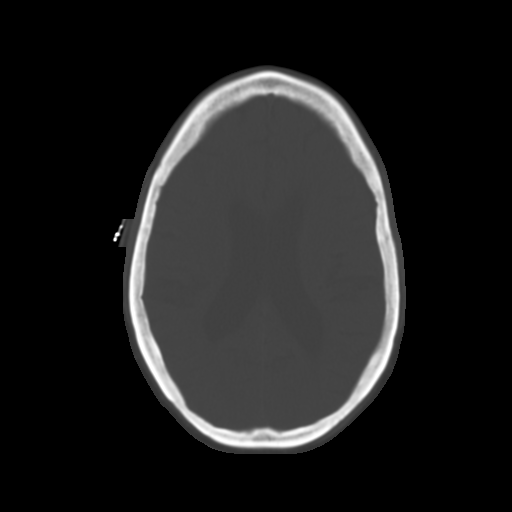
[im 21/31  brain]
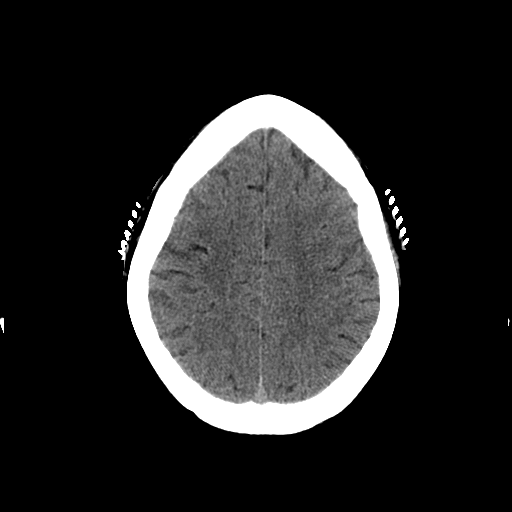
[im 24/31  brain]
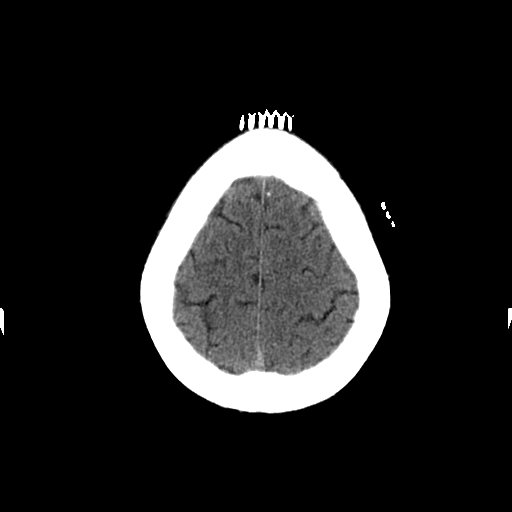
[im 27/31  brain]
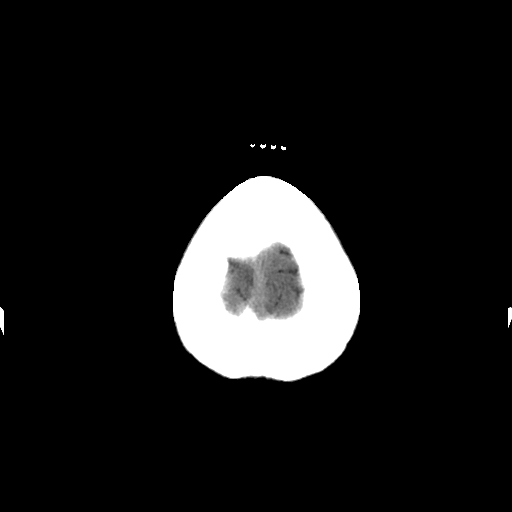

[Series 3: head w/o bone · axial · non-contrast · 0.49mm/px · z∈[+117,+235]mm · 8 of 61 slices shown]
[im 7/61  bone]
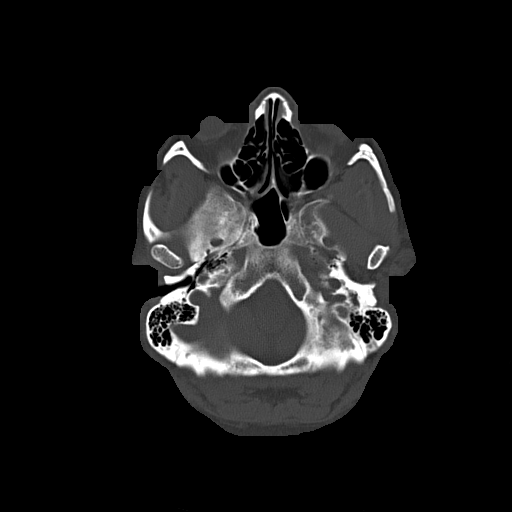
[im 13/61  bone]
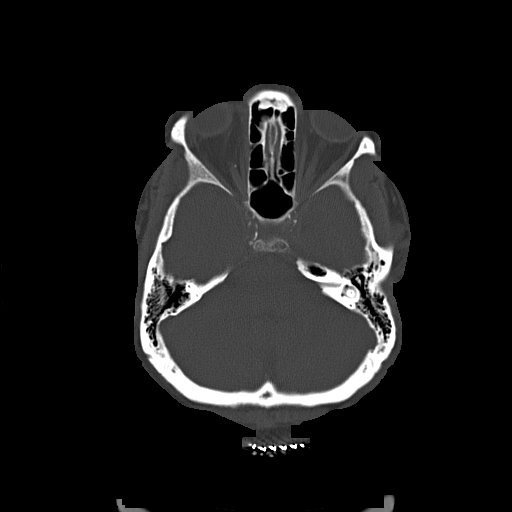
[im 19/61  bone]
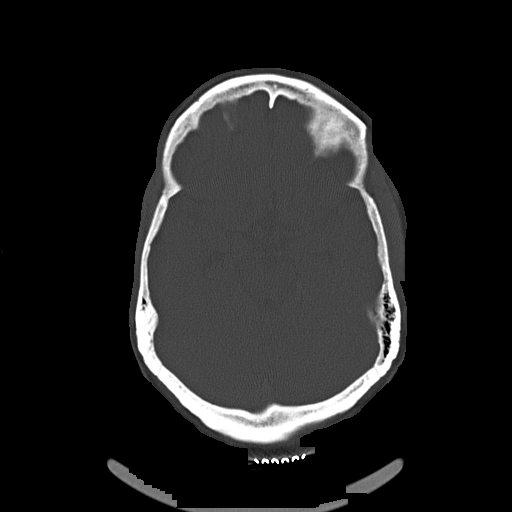
[im 26/61  bone]
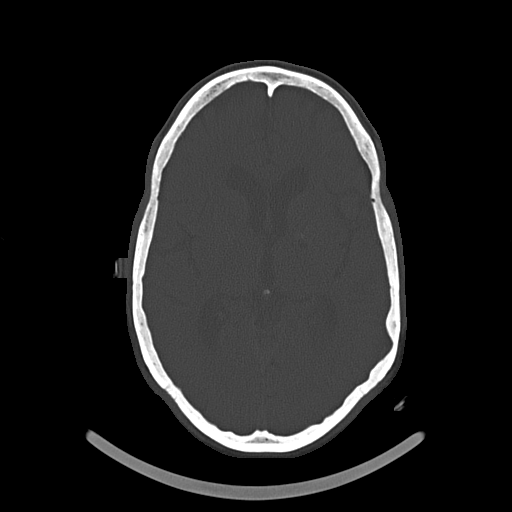
[im 35/61  bone]
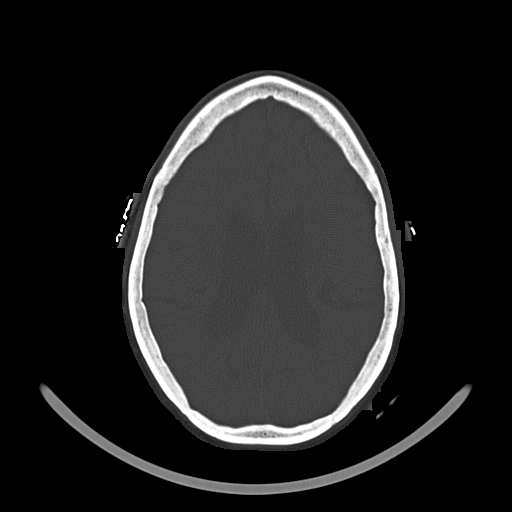
[im 42/61  bone]
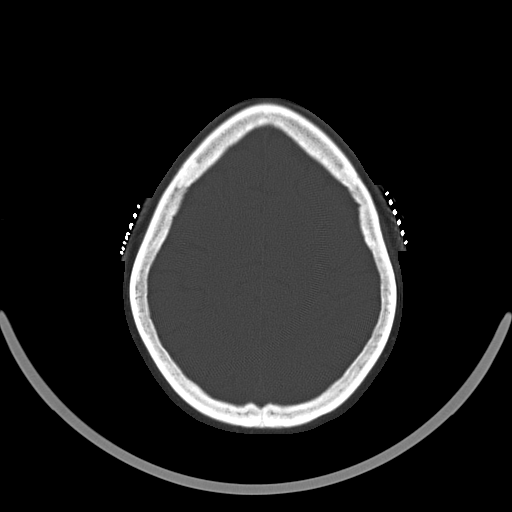
[im 48/61  bone]
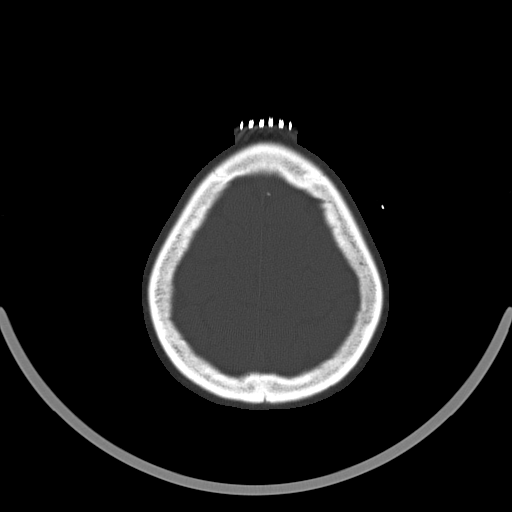
[im 54/61  bone]
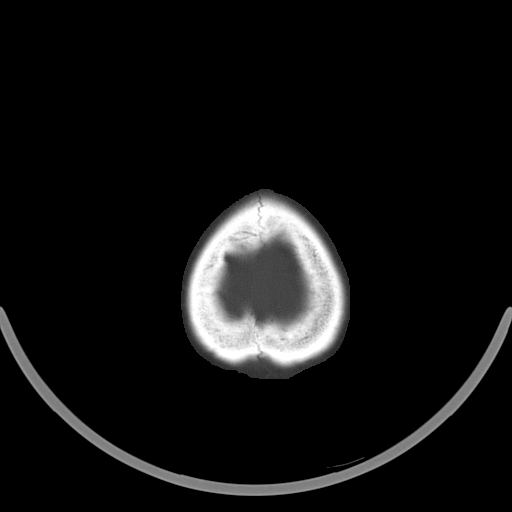

[16 of 30 positions shown; findings below may reference images not displayed]

FINDINGS: No mass lesion. No midline shift. No acute hemorrhage or hematoma.
No extra-axial fluid collections. No evidence of acute infarction.
Stable mild basal ganglia calcification.
IMPRESSION: No acute findings

## 2015-07-20 ENCOUNTER — Other Ambulatory Visit: Payer: Medicare Other

## 2015-07-25 ENCOUNTER — Ambulatory Visit
Admission: RE | Admit: 2015-07-25 | Discharge: 2015-07-25 | Disposition: A | Discharge status: Home / Self Care | Payer: Medicare Other | Source: Ambulatory Visit | Attending: Family Medicine | Admitting: Family Medicine

## 2015-07-25 DIAGNOSIS — N6310 Unspecified lump in the right breast, unspecified quadrant: Secondary | ICD-10-CM

## 2015-08-10 ENCOUNTER — Encounter (INDEPENDENT_AMBULATORY_CARE_PROVIDER_SITE_OTHER): Payer: Medicare Other | Admitting: Ophthalmology

## 2015-08-30 ENCOUNTER — Encounter: Payer: Self-pay | Admitting: Vascular Surgery

## 2015-09-07 ENCOUNTER — Ambulatory Visit: Payer: Medicare Other | Admitting: Vascular Surgery

## 2015-09-20 ENCOUNTER — Encounter: Payer: Self-pay | Admitting: Vascular Surgery

## 2015-09-23 ENCOUNTER — Ambulatory Visit: Payer: Medicare Other | Admitting: Vascular Surgery

## 2015-09-30 ENCOUNTER — Encounter: Payer: Self-pay | Admitting: Vascular Surgery

## 2015-09-30 ENCOUNTER — Other Ambulatory Visit: Payer: Self-pay | Admitting: Family Medicine

## 2015-10-03 ENCOUNTER — Other Ambulatory Visit: Payer: Self-pay | Admitting: Family Medicine

## 2015-10-03 DIAGNOSIS — N6310 Unspecified lump in the right breast, unspecified quadrant: Secondary | ICD-10-CM

## 2015-10-05 ENCOUNTER — Encounter: Payer: Self-pay | Admitting: Vascular Surgery

## 2015-10-05 ENCOUNTER — Ambulatory Visit (INDEPENDENT_AMBULATORY_CARE_PROVIDER_SITE_OTHER): Payer: Medicare Other | Admitting: Vascular Surgery

## 2015-10-05 VITALS — BP 135/69 | HR 90 | Temp 98.9°F | Resp 16 | Ht 61.0 in | Wt 156.0 lb

## 2015-10-05 DIAGNOSIS — N186 End stage renal disease: Secondary | ICD-10-CM

## 2015-10-05 DIAGNOSIS — Z992 Dependence on renal dialysis: Secondary | ICD-10-CM | POA: Diagnosis not present

## 2015-10-05 NOTE — Progress Notes (Signed)
[facility] [place]  Patient name: [name] MRN: [id number] DOB: [birth year] Sex: female  REASON FOR VISIT: Follow up of left thigh AV graft  HPI: NAVAE FRANCZAK is a 45 y.o. female who I last saw on 03/02/2015. She had a left thigh AV graft placed on 01/14/2015. The graft in the left thigh has had an excellent bruit and thrill however she's had pain in the thigh for no explainable reason. There is been no signs of infection. The graft does not appear to be too superficial. She has been using her right thigh graft which is been in for some time and has had multiple procedures on that side. This the feeling that ultimately the right thigh graft will fail she'll have to use the left thigh graft.  Past Medical History  Diagnosis Date  . Hyperlipidemia   . Alopecia   . Obesity   . Iron deficiency   . RLS (restless legs syndrome)   . Injury of conjunctiva and corneal abrasion of right eye without foreign body   . Cellulitis   . Anemia   . Blood transfusion 2004    "when I had my transplant"  . Migraine   . Hyperparathyroidism, secondary ([place])   . Diabetic retinopathy   . Hypertension     takes Metoprolol and Exforge daily  . Depression     takes Klonopin nightly  . Diabetes mellitus type 1 (HCC)     Pt states diagnosed at age 40 with prior episodes of DKA. S/p pancreatic transplant   . GERD (gastroesophageal reflux disease)     takes Protonix daily  . Bronchitis   . Migraine   . Asthma   . Emphysema of lung ([place])   . Angina     none in past 2 years.  . Stomach pain     Hx: of chronic  . Pneumonia 2012    "double"  . Dialysis patient ([place])     tues,thurs,sat  . 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  Surgery was done at [facility].     .  DKA (diabetic ketoacidoses) ([place]) 12/01/2014  . Diastolic CHF, acute on chronic ([place]) 01/02/2015  . Stroke [facility]) 03/2014    ? TIA  . ESRD (end stage renal disease) [facility])     S/p pancreatic and kidney transplant, but back on HD 2008, dialysis at [facility] [place], Sat    Family History  Problem Relation Age of Onset  . Hypertension Mother   . Kidney disease Mother   . Diabetes Mother   . Hypertension Father   . Kidney disease Father   . Diabetes Father     SOCIAL HISTORY: Social History  Substance Use Topics  . Smoking status: Never Smoker   . Smokeless tobacco: Never Used  . Alcohol Use: No    Allergies  Allergen Reactions  . Depakote [Divalproex Sodium] Other (See Comments)    Pt gets "delirious"  . Morphine And Related Nausea And Vomiting and Other (See Comments)    "makes me delirious"  . Penicillins Anaphylaxis    Tolerated Zosyn Dec 2014  . Tramadol Nausea And Vomiting  . Imitrex [Sumatriptan] Other (See Comments)    seizures  . Vicodin [Hydrocodone-Acetaminophen] Itching and Rash  Current Outpatient Prescriptions  Medication Sig Dispense Refill  . albuterol (PROVENTIL HFA;VENTOLIN HFA) 108 (90 BASE) MCG/ACT inhaler Inhale 2 puffs into the lungs every 4 (four) hours as needed for wheezing or shortness of breath (as per home regimen). 1 Inhaler 0  . amLODipine (NORVASC) 10 MG tablet Take 10 mg by mouth at bedtime.   11  . aspirin 325 MG tablet Take 1 tablet (325 mg total) by mouth daily. 30 tablet 0  . calcium acetate (PHOSLO) 667 MG capsule Take 1 capsule (667 mg total) by mouth 2 (two) times daily. (Patient taking differently: Take 2,001 mg by mouth 3 (three) times daily with meals. ) 30 capsule 0  . clonazePAM (KLONOPIN) 0.5 MG tablet Take 1 mg by mouth at bedtime.     . Darbepoetin Alfa (ARANESP) 40 MCG/0.4ML SOSY injection Inject 0.4 mLs (40 mcg total) into the vein every Thursday with hemodialysis. 8.4 mL   . doxercalciferol (HECTOROL) 4 MCG/2ML  injection Inject 1 mL (2 mcg total) into the vein Every Tuesday,Thursday,and Saturday with dialysis. 2 mL   . gabapentin (NEURONTIN) 100 MG capsule Take 100 mg by mouth daily.     Marland Kitchen guaiFENesin (MUCINEX) 600 MG 12 hr tablet Take 1 tablet (600 mg total) by mouth 2 (two) times daily. 30 tablet 0  . insulin glargine (LANTUS) 100 UNIT/ML injection Inject 0.07 mLs (7 Units total) into the skin 2 (two) times daily. 10 mL 11  . insulin lispro (HUMALOG KWIKPEN) 100 UNIT/ML KiwkPen Inject 6 Units into the skin 2 (two) times daily.     Marland Kitchen lidocaine (LIDODERM) 5 % Place 1 patch onto the skin daily. Remove & Discard patch within 12 hours or as directed by MD 10 patch 0  . metoCLOPramide (REGLAN) 10 MG tablet Take 1 tablet (10 mg total) by mouth 4 (four) times daily -  before meals and at bedtime. 60 tablet 0  . metoprolol succinate (TOPROL-XL) 50 MG 24 hr tablet Take 1 tablet (50 mg total) by mouth daily. Take with or immediately following a meal. 30 tablet 1  . montelukast (SINGULAIR) 10 MG tablet Take 10 mg by mouth at bedtime.    . multivitamin (RENA-VIT) TABS tablet Take 1 tablet by mouth daily. 30 tablet 0  . Nutritional Supplements (FEEDING SUPPLEMENT, NEPRO CARB STEADY,) LIQD Take 237 mLs by mouth 2 (two) times daily between meals. 60 Can 0  . Oxycodone HCl 10 MG TABS Take 10 mg by mouth 3 (three) times daily as needed (for pain).   0  . oxyCODONE-acetaminophen (PERCOCET/ROXICET) 5-325 MG tablet Take 1-2 tablets by mouth every 6 (six) hours as needed for severe pain. 30 tablet 0  . pantoprazole (PROTONIX) 40 MG tablet Take 1 tablet (40 mg total) by mouth at bedtime. (Patient taking differently: Take 40 mg by mouth daily. Takes daily in the afternoon) 30 tablet 0  . promethazine (PHENERGAN) 25 MG tablet Take 1 tablet (25 mg total) by mouth every 6 (six) hours as needed for nausea or vomiting. 10 tablet 0  . simvastatin (ZOCOR) 20 MG tablet Take 1 tablet (20 mg total) by mouth at bedtime. 30 tablet 0  .  warfarin (COUMADIN) 5 MG tablet Take 0.5 tablets (2.5 mg total) by mouth daily at 6 PM. (Patient taking differently: Take 5-7.5 mg by mouth See admin instructions. Take 1 tablet on Tuesday, Thursday and Saturday then take 1 and 1/2 tablets on Monday, Wednesday, Friday and Sunday) 60 tablet 0  . zolpidem (AMBIEN) 5 MG  tablet Take 5 mg by mouth at bedtime as needed. sleep     No current facility-administered medications for this visit.    REVIEW OF SYSTEMS:  [X]  denotes positive finding, [ ]  denotes negative finding Cardiac  Comments:  Chest pain or chest pressure:    Shortness of breath upon exertion:    Short of breath when lying flat:    Irregular heart rhythm:        Vascular    Pain in calf, thigh, or hip brought on by ambulation:    Pain in feet at night that wakes you up from your sleep:     Blood clot in your veins:    Leg swelling:         Pulmonary    Oxygen at home:    Productive cough:     Wheezing:         Neurologic    Sudden weakness in arms or legs:     Sudden numbness in arms or legs:     Sudden onset of difficulty speaking or slurred speech:    Temporary loss of vision in one eye:     Problems with dizziness:         Gastrointestinal    Blood in stool:     Vomited blood:         Genitourinary    Burning when urinating:     Blood in urine:        Psychiatric    Major depression:         Hematologic    Bleeding problems:    Problems with blood clotting too easily:        Skin    Rashes or ulcers:        Constitutional    Fever or chills:      PHYSICAL EXAM: Filed Vitals:   10/05/15 0940  BP: 135/69  Pulse: 90  Temp: 98.9 F (37.2 C)  TempSrc: Oral  Resp: 16  Height: 5\' 1"  (1.549 m)  Weight: 156 lb (70.761 kg)  SpO2: 100%    GENERAL: The patient is a well-nourished female, in no acute distress. The vital signs are documented above. CARDIAC: There is a regular rate and rhythm.  VASCULAR: she has palpable pedal pulses on the left  foot. She has an excellent bruit and thrill in both her right thigh graft and her left thigh graft. PULMONARY: There is good air exchange bilaterally without wheezing or rales. SKIN: There are no ulcers or rashes noted. PSYCHIATRIC: The patient has a normal affect.  MEDICAL ISSUES:  STAGE V CHRONIC KIDNEY DISEASE: She is currently being dialyzed with her right thigh graft. Her left thigh graft has an excellent bruit and thrill in I've explained that I really think she needs to let him use this graft for dialysis on the left. I do not think this will affect her chronic pain in the left thigh. She has excellent circulation on the left and no signs of infection so that her pain is difficult to explain. Regardless of think ultimately it would be very difficult to further revise the right thigh graft. She seems amenable to letting them cannulate the left thigh graft. I will see her back as needed.  Deitra Mayo [facility] [place]: [phone]

## 2015-10-26 ENCOUNTER — Other Ambulatory Visit: Payer: Medicare Other

## 2015-10-28 ENCOUNTER — Emergency Department (HOSPITAL_COMMUNITY): Payer: Medicare Other

## 2015-10-28 ENCOUNTER — Inpatient Hospital Stay (HOSPITAL_COMMUNITY): Payer: Medicare Other

## 2015-10-28 ENCOUNTER — Encounter (HOSPITAL_COMMUNITY): Payer: Self-pay | Admitting: Emergency Medicine

## 2015-10-28 DIAGNOSIS — J9601 Acute respiratory failure with hypoxia: Secondary | ICD-10-CM | POA: Diagnosis present

## 2015-10-28 DIAGNOSIS — Z992 Dependence on renal dialysis: Secondary | ICD-10-CM

## 2015-10-28 DIAGNOSIS — Z940 Kidney transplant status: Secondary | ICD-10-CM | POA: Diagnosis not present

## 2015-10-28 DIAGNOSIS — D696 Thrombocytopenia, unspecified: Secondary | ICD-10-CM | POA: Diagnosis present

## 2015-10-28 DIAGNOSIS — Z880 Allergy status to penicillin: Secondary | ICD-10-CM

## 2015-10-28 DIAGNOSIS — Z79899 Other long term (current) drug therapy: Secondary | ICD-10-CM

## 2015-10-28 DIAGNOSIS — I5032 Chronic diastolic (congestive) heart failure: Secondary | ICD-10-CM | POA: Diagnosis not present

## 2015-10-28 DIAGNOSIS — Z8673 Personal history of transient ischemic attack (TIA), and cerebral infarction without residual deficits: Secondary | ICD-10-CM | POA: Diagnosis not present

## 2015-10-28 DIAGNOSIS — R6521 Severe sepsis with septic shock: Secondary | ICD-10-CM | POA: Diagnosis not present

## 2015-10-28 DIAGNOSIS — E1065 Type 1 diabetes mellitus with hyperglycemia: Secondary | ICD-10-CM | POA: Diagnosis present

## 2015-10-28 DIAGNOSIS — K219 Gastro-esophageal reflux disease without esophagitis: Secondary | ICD-10-CM | POA: Diagnosis present

## 2015-10-28 DIAGNOSIS — Z515 Encounter for palliative care: Secondary | ICD-10-CM | POA: Diagnosis present

## 2015-10-28 DIAGNOSIS — E1022 Type 1 diabetes mellitus with diabetic chronic kidney disease: Secondary | ICD-10-CM | POA: Diagnosis present

## 2015-10-28 DIAGNOSIS — D689 Coagulation defect, unspecified: Secondary | ICD-10-CM | POA: Diagnosis present

## 2015-10-28 DIAGNOSIS — Z885 Allergy status to narcotic agent status: Secondary | ICD-10-CM

## 2015-10-28 DIAGNOSIS — G2581 Restless legs syndrome: Secondary | ICD-10-CM | POA: Diagnosis present

## 2015-10-28 DIAGNOSIS — Z794 Long term (current) use of insulin: Secondary | ICD-10-CM | POA: Diagnosis not present

## 2015-10-28 DIAGNOSIS — G931 Anoxic brain damage, not elsewhere classified: Secondary | ICD-10-CM | POA: Diagnosis not present

## 2015-10-28 DIAGNOSIS — N186 End stage renal disease: Secondary | ICD-10-CM | POA: Diagnosis present

## 2015-10-28 DIAGNOSIS — Z6833 Body mass index (BMI) 33.0-33.9, adult: Secondary | ICD-10-CM | POA: Diagnosis not present

## 2015-10-28 DIAGNOSIS — E785 Hyperlipidemia, unspecified: Secondary | ICD-10-CM | POA: Diagnosis present

## 2015-10-28 DIAGNOSIS — Z7901 Long term (current) use of anticoagulants: Secondary | ICD-10-CM | POA: Diagnosis not present

## 2015-10-28 DIAGNOSIS — A419 Sepsis, unspecified organism: Secondary | ICD-10-CM | POA: Diagnosis not present

## 2015-10-28 DIAGNOSIS — E669 Obesity, unspecified: Secondary | ICD-10-CM | POA: Diagnosis present

## 2015-10-28 DIAGNOSIS — Z9483 Pancreas transplant status: Secondary | ICD-10-CM

## 2015-10-28 DIAGNOSIS — J45909 Unspecified asthma, uncomplicated: Secondary | ICD-10-CM | POA: Diagnosis present

## 2015-10-28 DIAGNOSIS — R109 Unspecified abdominal pain: Secondary | ICD-10-CM | POA: Diagnosis present

## 2015-10-28 DIAGNOSIS — E872 Acidosis: Secondary | ICD-10-CM | POA: Diagnosis present

## 2015-10-28 DIAGNOSIS — N179 Acute kidney failure, unspecified: Secondary | ICD-10-CM

## 2015-10-28 DIAGNOSIS — Z833 Family history of diabetes mellitus: Secondary | ICD-10-CM | POA: Diagnosis not present

## 2015-10-28 DIAGNOSIS — E875 Hyperkalemia: Secondary | ICD-10-CM | POA: Diagnosis present

## 2015-10-28 DIAGNOSIS — E10319 Type 1 diabetes mellitus with unspecified diabetic retinopathy without macular edema: Secondary | ICD-10-CM | POA: Diagnosis present

## 2015-10-28 DIAGNOSIS — Z4659 Encounter for fitting and adjustment of other gastrointestinal appliance and device: Secondary | ICD-10-CM | POA: Insufficient documentation

## 2015-10-28 DIAGNOSIS — D6489 Other specified anemias: Secondary | ICD-10-CM | POA: Diagnosis present

## 2015-10-28 DIAGNOSIS — I469 Cardiac arrest, cause unspecified: Principal | ICD-10-CM | POA: Diagnosis present

## 2015-10-28 DIAGNOSIS — Z8249 Family history of ischemic heart disease and other diseases of the circulatory system: Secondary | ICD-10-CM

## 2015-10-28 DIAGNOSIS — J439 Emphysema, unspecified: Secondary | ICD-10-CM | POA: Diagnosis present

## 2015-10-28 DIAGNOSIS — Z7982 Long term (current) use of aspirin: Secondary | ICD-10-CM | POA: Diagnosis not present

## 2015-10-28 DIAGNOSIS — Z888 Allergy status to other drugs, medicaments and biological substances status: Secondary | ICD-10-CM

## 2015-10-28 DIAGNOSIS — I132 Hypertensive heart and chronic kidney disease with heart failure and with stage 5 chronic kidney disease, or end stage renal disease: Secondary | ICD-10-CM | POA: Diagnosis not present

## 2015-10-28 DIAGNOSIS — N2581 Secondary hyperparathyroidism of renal origin: Secondary | ICD-10-CM | POA: Diagnosis present

## 2015-10-28 DIAGNOSIS — I214 Non-ST elevation (NSTEMI) myocardial infarction: Secondary | ICD-10-CM | POA: Diagnosis present

## 2015-10-28 DIAGNOSIS — Z841 Family history of disorders of kidney and ureter: Secondary | ICD-10-CM

## 2015-10-28 DIAGNOSIS — Z886 Allergy status to analgesic agent status: Secondary | ICD-10-CM | POA: Diagnosis not present

## 2015-10-28 DIAGNOSIS — Z452 Encounter for adjustment and management of vascular access device: Secondary | ICD-10-CM | POA: Insufficient documentation

## 2015-10-28 DIAGNOSIS — Z66 Do not resuscitate: Secondary | ICD-10-CM | POA: Diagnosis present

## 2015-10-28 LAB — BASIC METABOLIC PANEL
Anion gap: 22 — ABNORMAL HIGH (ref 5–15)
Anion gap: 27 — ABNORMAL HIGH (ref 5–15)
Anion gap: 32 — ABNORMAL HIGH (ref 5–15)
Anion gap: 36 — ABNORMAL HIGH (ref 5–15)
Anion gap: 35 — ABNORMAL HIGH (ref 5–15)
BUN: 13 mg/dL (ref 6–20)
BUN: 15 mg/dL (ref 6–20)
BUN: 16 mg/dL (ref 6–20)
BUN: 16 mg/dL (ref 6–20)
BUN: 15 mg/dL (ref 6–20)
CALCIUM: 8.1 mg/dL — AB (ref 8.9–10.3)
CALCIUM: 7.8 mg/dL — AB (ref 8.9–10.3)
CALCIUM: 7.7 mg/dL — AB (ref 8.9–10.3)
CALCIUM: 7.8 mg/dL — AB (ref 8.9–10.3)
CALCIUM: 7.4 mg/dL — AB (ref 8.9–10.3)
CHLORIDE: 96 mmol/L — AB (ref 101–111)
CHLORIDE: 92 mmol/L — AB (ref 101–111)
CHLORIDE: 92 mmol/L — AB (ref 101–111)
CHLORIDE: 89 mmol/L — AB (ref 101–111)
CO2: 17 mmol/L — ABNORMAL LOW (ref 22–32)
CO2: 18 mmol/L — AB (ref 22–32)
CO2: 12 mmol/L — AB (ref 22–32)
CO2: 11 mmol/L — AB (ref 22–32)
CO2: 12 mmol/L — AB (ref 22–32)
CREATININE: 2.99 mg/dL — AB (ref 0.44–1.00)
CREATININE: 3.78 mg/dL — AB (ref 0.44–1.00)
CREATININE: 3.88 mg/dL — AB (ref 0.44–1.00)
CREATININE: 3.95 mg/dL — AB (ref 0.44–1.00)
CREATININE: 3.89 mg/dL — AB (ref 0.44–1.00)
Chloride: 88 mmol/L — ABNORMAL LOW (ref 101–111)
GFR calc non Af Amer: 18 mL/min — ABNORMAL LOW (ref >60)
GFR calc non Af Amer: 14 mL/min — ABNORMAL LOW (ref >60)
GFR calc non Af Amer: 13 mL/min — ABNORMAL LOW (ref >60)
GFR calc non Af Amer: 13 mL/min — ABNORMAL LOW (ref >60)
GFR, EST AFRICAN AMERICAN: 21 mL/min — AB (ref >60)
GFR, EST AFRICAN AMERICAN: 16 mL/min — AB (ref >60)
GFR, EST AFRICAN AMERICAN: 15 mL/min — AB (ref >60)
GFR, EST AFRICAN AMERICAN: 15 mL/min — AB (ref >60)
GFR, EST AFRICAN AMERICAN: 15 mL/min — AB (ref >60)
GFR, EST NON AFRICAN AMERICAN: 13 mL/min — AB (ref >60)
Glucose, Bld: 76 mg/dL (ref 65–99)
Glucose, Bld: 72 mg/dL (ref 65–99)
Glucose, Bld: 83 mg/dL (ref 65–99)
Glucose, Bld: 123 mg/dL — ABNORMAL HIGH (ref 65–99)
Glucose, Bld: 140 mg/dL — ABNORMAL HIGH (ref 65–99)
Potassium: 3.1 mmol/L — ABNORMAL LOW (ref 3.5–5.1)
Potassium: 4.9 mmol/L (ref 3.5–5.1)
Potassium: 5.3 mmol/L — ABNORMAL HIGH (ref 3.5–5.1)
Potassium: 5.6 mmol/L — ABNORMAL HIGH (ref 3.5–5.1)
Potassium: 5.1 mmol/L (ref 3.5–5.1)
SODIUM: 135 mmol/L (ref 135–145)
SODIUM: 137 mmol/L (ref 135–145)
SODIUM: 136 mmol/L (ref 135–145)
Sodium: 136 mmol/L (ref 135–145)
Sodium: 135 mmol/L (ref 135–145)

## 2015-10-28 LAB — POCT I-STAT 3, ART BLOOD GAS (G3+)
ACID-BASE DEFICIT: 18 mmol/L — AB (ref 0.0–2.0)
Acid-base deficit: 11 mmol/L — ABNORMAL HIGH (ref 0.0–2.0)
Acid-base deficit: 15 mmol/L — ABNORMAL HIGH (ref 0.0–2.0)
BICARBONATE: 10.5 meq/L — AB (ref 20.0–24.0)
BICARBONATE: 11.8 meq/L — AB (ref 20.0–24.0)
Bicarbonate: 17 mEq/L — ABNORMAL LOW (ref 20.0–24.0)
O2 Saturation: 82 %
O2 Saturation: 100 %
O2 Saturation: 98 %
PCO2 ART: 43.5 mmHg (ref 35.0–45.0)
PH ART: 7.19 — AB (ref 7.350–7.450)
PH ART: 7.207 — AB (ref 7.350–7.450)
PO2 ART: 255 mmHg — AB (ref 80.0–100.0)
PO2 ART: 122 mmHg — AB (ref 80.0–100.0)
Patient temperature: 35.3
TCO2: 12 mmol/L (ref 0–100)
TCO2: 18 mmol/L (ref 0–100)
TCO2: 13 mmol/L (ref 0–100)
pCO2 arterial: 35.5 mmHg (ref 35.0–45.0)
pCO2 arterial: 29.7 mmHg — ABNORMAL LOW (ref 35.0–45.0)
pH, Arterial: 7.081 — CL (ref 7.350–7.450)
pO2, Arterial: 63 mmHg — ABNORMAL LOW (ref 80.0–100.0)

## 2015-10-28 LAB — BLOOD GAS, ARTERIAL
Acid-base deficit: 11.8 mmol/L — ABNORMAL HIGH (ref 0.0–2.0)
BICARBONATE: 14.1 meq/L — AB (ref 20.0–24.0)
Drawn by: 405301
FIO2: 1
MECHVT: 380 mL
O2 Saturation: 98 %
PATIENT TEMPERATURE: 96.9
PEEP: 8 cmH2O
PO2 ART: 123 mmHg — AB (ref 80.0–100.0)
RATE: 35 resp/min
TCO2: 15.2 mmol/L (ref 0–100)
pCO2 arterial: 32.4 mmHg — ABNORMAL LOW (ref 35.0–45.0)
pH, Arterial: 7.256 — ABNORMAL LOW (ref 7.350–7.450)

## 2015-10-28 LAB — HCG, SERUM, QUALITATIVE: Preg, Serum: NEGATIVE

## 2015-10-28 LAB — GLUCOSE, CAPILLARY
GLUCOSE-CAPILLARY: 135 mg/dL — AB (ref 65–99)
GLUCOSE-CAPILLARY: 82 mg/dL (ref 65–99)
GLUCOSE-CAPILLARY: 169 mg/dL — AB (ref 65–99)
GLUCOSE-CAPILLARY: 148 mg/dL — AB (ref 65–99)
Glucose-Capillary: 66 mg/dL (ref 65–99)
Glucose-Capillary: 144 mg/dL — ABNORMAL HIGH (ref 65–99)

## 2015-10-28 LAB — POCT I-STAT, CHEM 8
BUN: 33 mg/dL — ABNORMAL HIGH (ref 6–20)
BUN: 15 mg/dL (ref 6–20)
BUN: 19 mg/dL (ref 6–20)
CALCIUM ION: 1.38 mmol/L — AB (ref 1.12–1.23)
CALCIUM ION: 0.71 mmol/L — AB (ref 1.12–1.23)
CHLORIDE: 102 mmol/L (ref 101–111)
CHLORIDE: 94 mmol/L — AB (ref 101–111)
CHLORIDE: 91 mmol/L — AB (ref 101–111)
Calcium, Ion: 1.05 mmol/L — ABNORMAL LOW (ref 1.12–1.23)
Creatinine, Ser: 5.9 mg/dL — ABNORMAL HIGH (ref 0.44–1.00)
Creatinine, Ser: 2.7 mg/dL — ABNORMAL HIGH (ref 0.44–1.00)
Creatinine, Ser: 3.9 mg/dL — ABNORMAL HIGH (ref 0.44–1.00)
GLUCOSE: 176 mg/dL — AB (ref 65–99)
GLUCOSE: 82 mg/dL (ref 65–99)
Glucose, Bld: 123 mg/dL — ABNORMAL HIGH (ref 65–99)
HCT: 29 % — ABNORMAL LOW (ref 36.0–46.0)
HCT: 33 % — ABNORMAL LOW (ref 36.0–46.0)
HEMATOCRIT: 37 % (ref 36.0–46.0)
Hemoglobin: 9.9 g/dL — ABNORMAL LOW (ref 12.0–15.0)
Hemoglobin: 12.6 g/dL (ref 12.0–15.0)
Hemoglobin: 11.2 g/dL — ABNORMAL LOW (ref 12.0–15.0)
POTASSIUM: 3.2 mmol/L — AB (ref 3.5–5.1)
POTASSIUM: 5.4 mmol/L — AB (ref 3.5–5.1)
Potassium: 5.5 mmol/L — ABNORMAL HIGH (ref 3.5–5.1)
SODIUM: 140 mmol/L (ref 135–145)
SODIUM: 132 mmol/L — AB (ref 135–145)
Sodium: 137 mmol/L (ref 135–145)
TCO2: 12 mmol/L (ref 0–100)
TCO2: 20 mmol/L (ref 0–100)
TCO2: 13 mmol/L (ref 0–100)

## 2015-10-28 LAB — PROTIME-INR
INR: 2.17 — AB (ref 0.00–1.49)
INR: 5.71 (ref 0.00–1.49)
PROTHROMBIN TIME: 24 s — AB (ref 11.6–15.2)
PROTHROMBIN TIME: 49.7 s — AB (ref 11.6–15.2)

## 2015-10-28 LAB — CBC WITH DIFFERENTIAL/PLATELET
Basophils Absolute: 0 10*3/uL (ref 0.0–0.1)
Basophils Relative: 0 %
EOS ABS: 0 10*3/uL (ref 0.0–0.7)
Eosinophils Relative: 0 %
HEMATOCRIT: 31.9 % — AB (ref 36.0–46.0)
HEMOGLOBIN: 9.5 g/dL — AB (ref 12.0–15.0)
Lymphocytes Relative: 7 %
Lymphs Abs: 0.9 10*3/uL (ref 0.7–4.0)
MCH: 27.9 pg (ref 26.0–34.0)
MCHC: 29.8 g/dL — AB (ref 30.0–36.0)
MCV: 93.8 fL (ref 78.0–100.0)
MONO ABS: 0.5 10*3/uL (ref 0.1–1.0)
MONOS PCT: 4 %
NEUTROS PCT: 89 %
Neutro Abs: 11 10*3/uL — ABNORMAL HIGH (ref 1.7–7.7)
Platelets: 84 10*3/uL — ABNORMAL LOW (ref 150–400)
RBC: 3.4 MIL/uL — ABNORMAL LOW (ref 3.87–5.11)
RDW: 18.8 % — ABNORMAL HIGH (ref 11.5–15.5)
WBC: 12.4 10*3/uL — ABNORMAL HIGH (ref 4.0–10.5)

## 2015-10-28 LAB — APTT
aPTT: 74 seconds — ABNORMAL HIGH (ref 24–37)
aPTT: 111 seconds — ABNORMAL HIGH (ref 24–37)

## 2015-10-28 LAB — COMPREHENSIVE METABOLIC PANEL
ALK PHOS: 192 U/L — AB (ref 38–126)
ALT: 30 U/L (ref 14–54)
ANION GAP: 28 — AB (ref 5–15)
AST: 107 U/L — ABNORMAL HIGH (ref 15–41)
Albumin: 3 g/dL — ABNORMAL LOW (ref 3.5–5.0)
BILIRUBIN TOTAL: 4.8 mg/dL — AB (ref 0.3–1.2)
BUN: 34 mg/dL — ABNORMAL HIGH (ref 6–20)
CALCIUM: 7.7 mg/dL — AB (ref 8.9–10.3)
CO2: 14 mmol/L — ABNORMAL LOW (ref 22–32)
Chloride: 88 mmol/L — ABNORMAL LOW (ref 101–111)
Creatinine, Ser: 6.82 mg/dL — ABNORMAL HIGH (ref 0.44–1.00)
GFR calc non Af Amer: 7 mL/min — ABNORMAL LOW (ref >60)
GFR, EST AFRICAN AMERICAN: 8 mL/min — AB (ref >60)
Glucose, Bld: 419 mg/dL — ABNORMAL HIGH (ref 65–99)
Potassium: 6.7 mmol/L (ref 3.5–5.1)
Sodium: 130 mmol/L — ABNORMAL LOW (ref 135–145)
TOTAL PROTEIN: 7.4 g/dL (ref 6.5–8.1)

## 2015-10-28 LAB — LIPASE, BLOOD
LIPASE: 15 U/L (ref 11–51)
Lipase: 13 U/L (ref 11–51)

## 2015-10-28 LAB — LACTATE DEHYDROGENASE: LDH: 2124 U/L — AB (ref 98–192)

## 2015-10-28 LAB — I-STAT CHEM 8, ED
BUN: 36 mg/dL — AB (ref 6–20)
CALCIUM ION: 0.8 mmol/L — AB (ref 1.12–1.23)
CHLORIDE: 93 mmol/L — AB (ref 101–111)
Creatinine, Ser: 6.8 mg/dL — ABNORMAL HIGH (ref 0.44–1.00)
GLUCOSE: 421 mg/dL — AB (ref 65–99)
HCT: 36 % (ref 36.0–46.0)
Hemoglobin: 12.2 g/dL (ref 12.0–15.0)
Potassium: 6.5 mmol/L (ref 3.5–5.1)
SODIUM: 128 mmol/L — AB (ref 135–145)
TCO2: 16 mmol/L (ref 0–100)

## 2015-10-28 LAB — MRSA PCR SCREENING: MRSA BY PCR: NEGATIVE

## 2015-10-28 LAB — TROPONIN I
TROPONIN I: 35.55 ng/mL — AB (ref <0.031)
Troponin I: 10.91 ng/mL (ref <0.031)
Troponin I: 21.52 ng/mL (ref <0.031)

## 2015-10-28 LAB — LACTIC ACID, PLASMA
LACTIC ACID, VENOUS: 11.7 mmol/L — AB (ref 0.5–2.0)
Lactic Acid, Venous: 21.6 mmol/L (ref 0.5–2.0)

## 2015-10-28 LAB — POTASSIUM
POTASSIUM: 6.4 mmol/L — AB (ref 3.5–5.1)
POTASSIUM: 3.7 mmol/L (ref 3.5–5.1)

## 2015-10-28 LAB — I-STAT CG4 LACTIC ACID, ED: Lactic Acid, Venous: 12.21 mmol/L (ref 0.5–2.0)

## 2015-10-28 LAB — PROCALCITONIN: PROCALCITONIN: 102.68 ng/mL

## 2015-10-28 LAB — AMYLASE: AMYLASE: 152 U/L — AB (ref 28–100)

## 2015-10-28 MED ORDER — MIDAZOLAM BOLUS VIA INFUSION
2.0000 mg | INTRAVENOUS | Status: DC | PRN
  Filled 2015-10-28: qty 2

## 2015-10-28 MED ORDER — PRISMASOL BGK 4/2.5 32-4-2.5 MEQ/L IV SOLN
INTRAVENOUS | Status: DC
  Administered 2015-10-28: 18:00:00 via INTRAVENOUS_CENTRAL
  Filled 2015-10-28 (×4): qty 5000

## 2015-10-28 MED ORDER — SODIUM BICARBONATE 8.4 % IV SOLN
INTRAVENOUS | Status: AC
  Filled 2015-10-28: qty 50

## 2015-10-28 MED ORDER — SODIUM CHLORIDE 0.9 % IV SOLN
100.0000 ug/h | INTRAVENOUS | Status: DC
  Filled 2015-10-28: qty 50

## 2015-10-28 MED ORDER — MIDAZOLAM HCL 2 MG/2ML IJ SOLN: 2.0000 mg | INTRAMUSCULAR | Status: DC | PRN

## 2015-10-28 MED ORDER — METRONIDAZOLE IN NACL 5-0.79 MG/ML-% IV SOLN: 500.0000 mg | Freq: Three times a day (TID) | INTRAVENOUS | Status: DC

## 2015-10-28 MED ORDER — PHENYLEPHRINE HCL 10 MG/ML IJ SOLN
0.0000 ug/min | INTRAMUSCULAR | Status: DC
  Administered 2015-10-28: 175 ug/min via INTRAVENOUS
  Administered 2015-10-28: 400 ug/min via INTRAVENOUS
  Administered 2015-10-29: 250 ug/min via INTRAVENOUS
  Administered 2015-10-29 (×2): 200 ug/min via INTRAVENOUS
  Administered 2015-10-29: 250 ug/min via INTRAVENOUS
  Filled 2015-10-28 (×9): qty 4

## 2015-10-28 MED ORDER — PHENYLEPHRINE HCL 10 MG/ML IJ SOLN
0.0000 ug/min | Freq: Once | INTRAVENOUS | Status: AC
  Administered 2015-10-28: 400 ug/min via INTRAVENOUS
  Filled 2015-10-28: qty 4

## 2015-10-28 MED ORDER — SODIUM CHLORIDE 0.9 % IV BOLUS (SEPSIS): 1000.0000 mL | Freq: Once | INTRAVENOUS | Status: DC

## 2015-10-28 MED ORDER — CALCIUM CHLORIDE 10 % IV SOLN
INTRAVENOUS | Status: DC | PRN
  Administered 2015-10-28: 1 g via INTRAVENOUS

## 2015-10-28 MED ORDER — LEVOFLOXACIN IN D5W 750 MG/150ML IV SOLN
750.0000 mg | Freq: Once | INTRAVENOUS | Status: DC
  Filled 2015-10-28: qty 150

## 2015-10-28 MED ORDER — SODIUM CHLORIDE 0.9 % IV SOLN: 2000.0000 mL | Freq: Once | INTRAVENOUS | Status: DC

## 2015-10-28 MED ORDER — SODIUM CHLORIDE 0.9 % IV SOLN
1.0000 ug/kg/min | INTRAVENOUS | Status: DC
  Filled 2015-10-28: qty 20

## 2015-10-28 MED ORDER — ONDANSETRON HCL 4 MG/2ML IJ SOLN: 4.0000 mg | Freq: Once | INTRAMUSCULAR | Status: DC

## 2015-10-28 MED ORDER — CALCIUM GLUCONATE 10 % IV SOLN
2.0000 g | Freq: Once | INTRAVENOUS | Status: DC
  Filled 2015-10-28: qty 20

## 2015-10-28 MED ORDER — HYDROMORPHONE HCL 1 MG/ML IJ SOLN
1.0000 mg | Freq: Once | INTRAMUSCULAR | Status: AC
  Administered 2015-10-28: 1 mg via INTRAVENOUS
  Filled 2015-10-28: qty 1

## 2015-10-28 MED ORDER — FENTANYL CITRATE (PF) 100 MCG/2ML IJ SOLN: 100.0000 ug | INTRAMUSCULAR | Status: DC | PRN

## 2015-10-28 MED ORDER — DEXTROSE 50 % IV SOLN: 100.0000 mL | Freq: Once | INTRAVENOUS | Status: DC

## 2015-10-28 MED ORDER — SODIUM CHLORIDE 0.9 % IV SOLN
INTRAVENOUS | Status: DC
  Administered 2015-10-28: 22:00:00 via INTRAVENOUS

## 2015-10-28 MED ORDER — INSULIN ASPART 100 UNIT/ML IV SOLN
10.0000 [IU] | Freq: Once | INTRAVENOUS | Status: AC
  Administered 2015-10-28: 10 [IU] via INTRAVENOUS

## 2015-10-28 MED ORDER — PRISMASOL BGK 4/2.5 32-4-2.5 MEQ/L IV SOLN
INTRAVENOUS | Status: DC
  Administered 2015-10-28 – 2015-10-29 (×7): via INTRAVENOUS_CENTRAL
  Filled 2015-10-28 (×20): qty 5000

## 2015-10-28 MED ORDER — EPINEPHRINE HCL 0.1 MG/ML IJ SOSY
PREFILLED_SYRINGE | INTRAMUSCULAR | Status: AC | PRN
  Administered 2015-10-28: 1 mg via INTRAVENOUS

## 2015-10-28 MED ORDER — SODIUM CHLORIDE 0.9 % IV SOLN
500.0000 mg | INTRAVENOUS | Status: DC
  Administered 2015-10-28: 500 mg via INTRAVENOUS
  Filled 2015-10-28 (×2): qty 0.5

## 2015-10-28 MED ORDER — SODIUM CHLORIDE 0.9 % IV BOLUS (SEPSIS)
250.0000 mL | Freq: Once | INTRAVENOUS | Status: AC
  Administered 2015-10-28: 250 mL via INTRAVENOUS

## 2015-10-28 MED ORDER — ALBUTEROL (5 MG/ML) CONTINUOUS INHALATION SOLN
INHALATION_SOLUTION | RESPIRATORY_TRACT | Status: AC
  Administered 2015-10-28: 06:00:00
  Filled 2015-10-28: qty 20

## 2015-10-28 MED ORDER — EPINEPHRINE HCL 0.1 MG/ML IJ SOSY
PREFILLED_SYRINGE | INTRAMUSCULAR | Status: AC
  Administered 2015-10-28: 1 mg
  Filled 2015-10-28: qty 10

## 2015-10-28 MED ORDER — INSULIN ASPART 100 UNIT/ML IV SOLN
INTRAVENOUS | Status: AC
  Filled 2015-10-28: qty 1

## 2015-10-28 MED ORDER — ALTEPLASE 2 MG IJ SOLR: 2.0000 mg | Freq: Once | INTRAMUSCULAR | Status: DC | PRN

## 2015-10-28 MED ORDER — EPINEPHRINE HCL 0.1 MG/ML IJ SOSY
PREFILLED_SYRINGE | INTRAMUSCULAR | Status: DC | PRN
  Administered 2015-10-28: 1 mg via INTRAVENOUS

## 2015-10-28 MED ORDER — SODIUM BICARBONATE 8.4 % IV SOLN
INTRAVENOUS | Status: AC | PRN
  Administered 2015-10-28 (×2): 50 meq via INTRAVENOUS

## 2015-10-28 MED ORDER — METOCLOPRAMIDE HCL 5 MG/ML IJ SOLN
10.0000 mg | Freq: Once | INTRAMUSCULAR | Status: AC
  Administered 2015-10-28: 10 mg via INTRAVENOUS
  Filled 2015-10-28: qty 2

## 2015-10-28 MED ORDER — VANCOMYCIN HCL 10 G IV SOLR
1500.0000 mg | Freq: Once | INTRAVENOUS | Status: AC
  Administered 2015-10-28: 1500 mg via INTRAVENOUS
  Filled 2015-10-28: qty 1500

## 2015-10-28 MED ORDER — LEVOFLOXACIN IN D5W 500 MG/100ML IV SOLN: 500.0000 mg | INTRAVENOUS | Status: DC

## 2015-10-28 MED ORDER — VITAMIN K1 10 MG/ML IJ SOLN
5.0000 mg | Freq: Once | INTRAMUSCULAR | Status: AC
  Administered 2015-10-28: 5 mg via INTRAVENOUS
  Filled 2015-10-28: qty 0.5

## 2015-10-28 MED ORDER — "THROMBI-PAD 3""X3"" EX PADS"
1.0000 | MEDICATED_PAD | Freq: Once | CUTANEOUS | Status: AC
  Administered 2015-10-28: 1 via TOPICAL
  Filled 2015-10-28: qty 1

## 2015-10-28 MED ORDER — CISATRACURIUM BOLUS VIA INFUSION
0.0500 mg/kg | INTRAVENOUS | Status: DC | PRN
  Filled 2015-10-28: qty 4

## 2015-10-28 MED ORDER — DEXTROSE 50 % IV SOLN
1.0000 | Freq: Once | INTRAVENOUS | Status: AC
  Administered 2015-10-28: 50 mL via INTRAVENOUS

## 2015-10-28 MED ORDER — CALCIUM CHLORIDE 10 % IV SOLN
INTRAVENOUS | Status: AC | PRN
  Administered 2015-10-28 (×2): 1 g via INTRAVENOUS

## 2015-10-28 MED ORDER — ATROPINE SULFATE 1 MG/ML IJ SOLN
INTRAMUSCULAR | Status: DC | PRN
  Administered 2015-10-28 (×2): 1 mg via INTRAVENOUS

## 2015-10-28 MED ORDER — ANTISEPTIC ORAL RINSE SOLUTION (CORINZ)
7.0000 mL | Freq: Four times a day (QID) | OROMUCOSAL | Status: DC
  Administered 2015-10-28 – 2015-10-29 (×5): 7 mL via OROMUCOSAL

## 2015-10-28 MED ORDER — ARTIFICIAL TEARS OP OINT: 1.0000 "application " | TOPICAL_OINTMENT | Freq: Three times a day (TID) | OPHTHALMIC | Status: DC

## 2015-10-28 MED ORDER — NOREPINEPHRINE BITARTRATE 1 MG/ML IV SOLN
0.0000 ug/min | INTRAVENOUS | Status: DC
  Administered 2015-10-28: 15 ug/min via INTRAVENOUS
  Administered 2015-10-28: 35 ug/min via INTRAVENOUS
  Administered 2015-10-29: 36 ug/min via INTRAVENOUS
  Administered 2015-10-29: 40 ug/min via INTRAVENOUS
  Filled 2015-10-28 (×7): qty 16

## 2015-10-28 MED ORDER — SODIUM BICARBONATE 8.4 % IV SOLN
INTRAVENOUS | Status: DC | PRN
  Administered 2015-10-28 (×2): 50 meq via INTRAVENOUS

## 2015-10-28 MED ORDER — SODIUM CHLORIDE 0.9 % FOR CRRT
INTRAVENOUS_CENTRAL | Status: DC | PRN
  Filled 2015-10-28: qty 1000

## 2015-10-28 MED ORDER — ASPIRIN 300 MG RE SUPP: 300.0000 mg | RECTAL | Status: DC

## 2015-10-28 MED ORDER — MIDAZOLAM HCL 5 MG/ML IJ SOLN
2.0000 mg/h | INTRAMUSCULAR | Status: DC
  Filled 2015-10-28: qty 10

## 2015-10-28 MED ORDER — ASPIRIN 325 MG PO TABS
325.0000 mg | ORAL_TABLET | Freq: Once | ORAL | Status: AC
  Administered 2015-10-28: 325 mg
  Filled 2015-10-28: qty 1

## 2015-10-28 MED ORDER — SODIUM CHLORIDE 0.9 % IV SOLN
1.0000 g | Freq: Two times a day (BID) | INTRAVENOUS | Status: DC
  Administered 2015-10-28 – 2015-10-29 (×2): 1 g via INTRAVENOUS
  Filled 2015-10-28 (×3): qty 1

## 2015-10-28 MED ORDER — VANCOMYCIN HCL IN DEXTROSE 750-5 MG/150ML-% IV SOLN
750.0000 mg | INTRAVENOUS | Status: DC
  Administered 2015-10-29: 750 mg via INTRAVENOUS
  Filled 2015-10-28: qty 150

## 2015-10-28 MED ORDER — FENTANYL CITRATE (PF) 100 MCG/2ML IJ SOLN: 100.0000 ug | Freq: Once | INTRAMUSCULAR | Status: DC

## 2015-10-28 MED ORDER — STERILE WATER FOR INJECTION IV SOLN
INTRAVENOUS | Status: DC
  Administered 2015-10-28 – 2015-10-29 (×7): via INTRAVENOUS_CENTRAL
  Filled 2015-10-28 (×16): qty 150

## 2015-10-28 MED ORDER — CHLORHEXIDINE GLUCONATE 0.12% ORAL RINSE (MEDLINE KIT)
15.0000 mL | Freq: Two times a day (BID) | OROMUCOSAL | Status: DC
  Administered 2015-10-28 – 2015-10-29 (×3): 15 mL via OROMUCOSAL

## 2015-10-28 MED ORDER — MIDAZOLAM HCL 2 MG/2ML IJ SOLN: 2.0000 mg | Freq: Once | INTRAMUSCULAR | Status: DC

## 2015-10-28 MED ORDER — ALBUTEROL (5 MG/ML) CONTINUOUS INHALATION SOLN: 20.0000 mg/h | INHALATION_SOLUTION | RESPIRATORY_TRACT | Status: DC

## 2015-10-28 MED ORDER — DEXTROSE 5 % IV SOLN
0.5000 ug/min | INTRAVENOUS | Status: DC
  Filled 2015-10-28: qty 4

## 2015-10-28 MED ORDER — ACETAMINOPHEN 500 MG PO TABS: 1000.0000 mg | ORAL_TABLET | Freq: Once | ORAL | Status: DC

## 2015-10-28 MED ORDER — FENTANYL BOLUS VIA INFUSION
50.0000 ug | INTRAVENOUS | Status: DC | PRN
  Filled 2015-10-28: qty 50

## 2015-10-28 MED ORDER — CISATRACURIUM BOLUS VIA INFUSION
0.1000 mg/kg | Freq: Once | INTRAVENOUS | Status: DC
Start: 2015-10-28 — End: 2015-10-28
  Filled 2015-10-28: qty 8

## 2015-10-28 MED ORDER — IOPAMIDOL (ISOVUE-370) INJECTION 76%
INTRAVENOUS | Status: AC
  Filled 2015-10-28: qty 100

## 2015-10-28 MED ORDER — NOREPINEPHRINE BITARTRATE 1 MG/ML IV SOLN
0.0000 ug/min | INTRAVENOUS | Status: DC
  Filled 2015-10-28 (×3): qty 4

## 2015-10-28 MED ORDER — LEVOFLOXACIN IN D5W 750 MG/150ML IV SOLN
750.0000 mg | Freq: Once | INTRAVENOUS | Status: AC
  Administered 2015-10-28: 750 mg via INTRAVENOUS
  Filled 2015-10-28: qty 150

## 2015-10-28 MED ORDER — SODIUM CHLORIDE 0.9 % IV SOLN
200.0000 mg | Freq: Once | INTRAVENOUS | Status: AC
  Administered 2015-10-28: 200 mg via INTRAVENOUS
  Filled 2015-10-28 (×2): qty 200

## 2015-10-28 MED ORDER — STERILE WATER FOR INJECTION IV SOLN
Freq: Once | INTRAVENOUS | Status: DC
  Filled 2015-10-28: qty 850

## 2015-10-28 MED ORDER — INSULIN ASPART 100 UNIT/ML ~~LOC~~ SOLN
0.0000 [IU] | SUBCUTANEOUS | Status: DC
  Administered 2015-10-28 – 2015-10-29 (×4): 1 [IU] via SUBCUTANEOUS

## 2015-10-28 MED ORDER — DEXTROSE 50 % IV SOLN
INTRAVENOUS | Status: AC
  Filled 2015-10-28: qty 50

## 2015-10-28 MED ORDER — SODIUM BICARBONATE 8.4 % IV SOLN
100.0000 meq | Freq: Once | INTRAVENOUS | Status: AC
  Administered 2015-10-28: 100 meq via INTRAVENOUS

## 2015-10-28 MED ORDER — PANTOPRAZOLE SODIUM 40 MG PO PACK
40.0000 mg | PACK | Freq: Every day | ORAL | Status: DC
  Administered 2015-10-28: 40 mg
  Filled 2015-10-28 (×2): qty 20

## 2015-10-28 MED ORDER — METRONIDAZOLE IN NACL 5-0.79 MG/ML-% IV SOLN
500.0000 mg | Freq: Three times a day (TID) | INTRAVENOUS | Status: DC
  Filled 2015-10-28: qty 100

## 2015-10-28 MED ORDER — METRONIDAZOLE IN NACL 5-0.79 MG/ML-% IV SOLN: 500.0000 mg | Freq: Once | INTRAVENOUS | Status: DC

## 2015-10-28 MED ORDER — SODIUM CHLORIDE 0.9 % IV SOLN
100.0000 mg | INTRAVENOUS | Status: DC
  Filled 2015-10-28: qty 100

## 2015-10-28 MED ORDER — SODIUM BICARBONATE 8.4 % IV SOLN
INTRAVENOUS | Status: DC
  Administered 2015-10-28 (×2): via INTRAVENOUS
  Filled 2015-10-28 (×5): qty 150

## 2015-10-28 MED ORDER — SODIUM CHLORIDE 0.9 % IV SOLN
INTRAVENOUS | Status: DC | PRN
  Administered 2015-10-28 (×3): 1000 mL via INTRAVENOUS

## 2015-10-28 MED ORDER — HEPARIN SODIUM (PORCINE) 1000 UNIT/ML DIALYSIS
1000.0000 [IU] | INTRAMUSCULAR | Status: DC | PRN
  Filled 2015-10-28: qty 6

## 2015-10-28 MED ORDER — SODIUM CHLORIDE 0.9 % IV SOLN
Freq: Once | INTRAVENOUS | Status: AC
  Administered 2015-10-28: 15:00:00 via INTRAVENOUS

## 2015-10-28 NOTE — Progress Notes (Signed)
Case management helped this RN find possible daughters number, Dr. Titus Mould is going to attempt to reach daughter now will number given.

## 2015-10-28 NOTE — Procedures (Signed)
Arterial Catheter Insertion Procedure Note ADRENE MANN [id number] [birth year]  Procedure: Insertion of Arterial Catheter  Indications: Blood pressure monitoring and Frequent blood sampling  Procedure Details Consent: Unable to obtain consent because of emergent medical necessity. Time Out: Verified patient identification, verified procedure, site/side was marked, verified correct patient position, special equipment/implants available, medications/allergies/relevent history reviewed, required imaging and test results available.  Performed  Maximum sterile technique was used including antiseptics, cap, gloves, gown, hand hygiene, mask and sheet. Skin prep: Chlorhexidine; local anesthetic administered 20 gauge catheter was inserted into left femoral artery using the Seldinger technique.  Evaluation Blood flow good; BP tracing good. Complications: No apparent complications.   [name] 10/27/2015   [place]  Shock Emergent need  Lavon Paganini. Titus Mould, MD, [place] Pgr: [facility]

## 2015-10-28 NOTE — Procedures (Signed)
Central Venous Catheter Insertion Procedure Note [name] [id number] 12/30/1970  Procedure: Insertion of Central Venous Catheter Indications: emegrent HD  Procedure Details Consent: Unable to obtain consent because of emergent medical necessity. Time Out: Verified patient identification, verified procedure, site/side was marked, verified correct patient position, special equipment/implants available, medications/allergies/relevent history reviewed, required imaging and test results available.  Performed  Maximum sterile technique was used including antiseptics, cap, gloves, gown, hand hygiene, mask and sheet. Skin prep: Chlorhexidine; local anesthetic administered A antimicrobial bonded/coated triple lumen catheter was placed in the left femoral vein due to patient being a dialysis patient and emergent situation using the Seldinger technique.  END STAGE ACCESS  Evaluation Blood flow good Complications: No apparent complications Patient did tolerate procedure well. Chest X-ray ordered to verify placement.  CXR: normal.  FEINSTEIN,DANIEL J. 11/02/2015, 8:02 AM  [place] difficult images No other access. She is actively dying and need emergent HD, nothing in neck Placed maybe in graft, no other options to treat her  [place]  Daniel J. Titus Mould, MD, [place] Pgr: [facility]

## 2015-10-28 NOTE — Code Documentation (Signed)
Titus Mould, MD ordered that patient be moved to [facility] immediately. Agricultural consultant coordinated with bed control while RNs and EMTs prepared patient for transport.

## 2015-10-28 NOTE — ED Provider Notes (Signed)
CSN: [id number]     Arrival date & time 10/30/2015  0418 History   First MD Initiated Contact with Patient 11/08/2015 [phone]     Chief Complaint  Patient presents with  . Nausea  . Emesis     (Consider location/radiation/quality/duration/timing/severity/associated sxs/prior Treatment) HPI  [name] is a 45 yo female, PMH ESRD on HD here with abdominal pain for the past 2 days.  She has had N/V/D as well.  She has had subjective fevers.  She has been compliant with dialysis schedule.  Nothin makes her symptoms better or worse, she denies having sick contacts. She has no history of this in the past. There are no further complaints.   10 Systems reviewed and are negative for acute change except as noted in the HPI.     Past Medical History  Diagnosis Date  . Hyperlipidemia   . Alopecia   . Obesity   . Iron deficiency   . RLS (restless legs syndrome)   . Injury of conjunctiva and corneal abrasion of right eye without foreign body   . Cellulitis   . Anemia   . Blood transfusion 2004    "when I had my transplant"  . Migraine   . Hyperparathyroidism, secondary ([place])   . Diabetic retinopathy   . Hypertension     takes Metoprolol and Exforge daily  . Depression     takes Klonopin nightly  . Diabetes mellitus type 1 (HCC)     Pt states diagnosed at age 29 with prior episodes of DKA. S/p pancreatic transplant   . GERD (gastroesophageal reflux disease)     takes Protonix daily  . Bronchitis   . Migraine   . Asthma   . Emphysema of lung ([place])   . Angina     none in past 2 years.  . Stomach pain     Hx: of chronic  . Pneumonia 2012    "double"  . Dialysis patient ([place])     tues,thurs,sat  . 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  Surgery was done at [facility].     . DKA  (diabetic ketoacidoses) ([place]) 12/01/2014  . Diastolic CHF, acute on chronic ([place]) 01/02/2015  . Stroke [facility]) 03/2014    ? TIA  . ESRD (end stage renal disease) [facility])     S/p pancreatic and kidney transplant, but back on HD 2008, dialysis at [facility], Sat   Past Surgical History  Procedure Laterality Date  . 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 Thyroglobulin      x 7  . Av fistula placement      right upper arm  . Cholecystectomy  1995  .  hd graft placement/removal      "had 2 in my left upper arm"  . Eye surgery    . Retinopathy surgery    . Tooth extraction  10/10/11    X's two  . Insertion of dialysis catheter  12/08/2011    Procedure: INSERTION OF DIALYSIS CATHETER;  Surgeon: Angelia Mould, MD;  Location: [place];  Service: Vascular;  Laterality: Left;  . Av fistula placement  01/22/2012    Procedure: INSERTION OF ARTERIOVENOUS (AV) GORE-TEX GRAFT ARM;  Surgeon: Angelia Mould, MD;  Location: [facility];  Service: Vascular;  Laterality: Left;  Ultrasound guided.  . Av fistula placement  03/14/2012    Procedure: INSERTION OF ARTERIOVENOUS (AV) GORE-TEX GRAFT THIGH;  Surgeon: Angelia Mould, MD;  Location: [place];  Service: Vascular;  Laterality: Right;  . False aneurysm repair Right 01/02/2013    Procedure: Excision of lymphocele in right thigh.;  Surgeon: Angelia Mould, MD;  Location: [place];  Service: Vascular;  Laterality: Right;  . Carpal tunnel release Right 03/02/2013    Procedure: CARPAL TUNNEL RELEASE;  Surgeon: Tennis Must, MD;  Location: [place];  Service: Orthopedics;  Laterality: Right;  . Flexible sigmoidoscopy N/A 03/24/2013    Procedure: FLEXIBLE SIGMOIDOSCOPY;  Surgeon: Cleotis Nipper, MD;  Location: [facility] ENDOSCOPY;  Service: Endoscopy;  Laterality: N/A;  . Revision of arteriovenous goretex graft Right 04/02/2013    Procedure: REPAIR OF PSEUDOANEURYSM OF ARTERIOVENOUS GORETEX  GRAFT;  Surgeon: Angelia Mould, MD;  Location: [place];  Service: Vascular;  Laterality: Right;  . Carpal tunnel release Left 10/16/2013    Procedure: LEFT CARPAL TUNNEL RELEASE;  Surgeon: Tennis Must, MD;  Location: [place];  Service: Orthopedics;  Laterality: Left;  . Unilateral upper extremeity angiogram N/A 11/12/2011    Procedure: UNILATERAL UPPER Anselmo Rod;  Surgeon: Angelia Mould, MD;  Location: [facility] CATH LAB;  Service: Cardiovascular;  Laterality: N/A;  . Fistulogram Left 03/03/2012    Procedure: FISTULOGRAM;  Surgeon: Angelia Mould, MD;  Location[facility] CATH LAB;  Service: Cardiovascular;  Laterality: Left;  . Av fistula placement Left 01/14/2015    Procedure: INSERTION OF LEFT ARTERIOVENOUS (AV) GORE-TEX GRAFT THIGH;  Surgeon: Angelia Mould, MD;  Location: [place];  Service: Vascular;  Laterality: Left;  . Tee without cardioversion N/A 04/05/2015    Procedure: TRANSESOPHAGEAL ECHOCARDIOGRAM (TEE);  Surgeon: Skeet Latch, MD;  Location: [facility] ENDOSCOPY;  Service: Cardiovascular;  Laterality: N/A;  . Lymph node biopsy Right 04/11/2015    Procedure: RIGHT SUPRACLAVICULAR LYMPH NODE BIOPSY;  Surgeon: Melrose Nakayama, MD;  Location: [place];  Service: Vascular;  Laterality: Right;   Family History  Problem Relation Age of Onset  . Hypertension Mother   . Kidney disease Mother   . Diabetes Mother   . Hypertension Father   . Kidney disease Father   . Diabetes Father    Social History  Substance Use Topics  . Smoking status: Never Smoker   . Smokeless tobacco: Never Used  . Alcohol Use: No   OB History    Gravida Para Term Preterm AB TAB SAB Ectopic Multiple Living   1              Review of Systems    Allergies  Depakote; Morphine and related; Penicillins; Tramadol; Imitrex; and Vicodin  Home Medications   Prior to Admission medications   Medication Sig Start Date End Date Taking? Authorizing Provider  albuterol  (PROVENTIL HFA;VENTOLIN HFA) 108 (90 BASE) MCG/ACT inhaler Inhale 2 puffs into the lungs every 4 (four) hours as needed for wheezing or shortness of breath (as per home regimen). 02/20/13   Kinnie Feil, MD  amLODipine (NORVASC) 10 MG tablet Take 10 mg by mouth at bedtime.  11/17/14   Historical Provider, MD  aspirin 325 MG tablet Take 1 tablet (325 mg total) by mouth daily. 05/20/14   Velvet Bathe, MD  calcium acetate (PHOSLO) 667 MG capsule Take 1 capsule (667 mg total) by mouth 2 (two)  times daily. Patient taking differently: Take 2,001 mg by mouth 3 (three) times daily with meals.  11/16/14   Theodis Blaze, MD  clonazePAM (KLONOPIN) 0.5 MG tablet Take 1 mg by mouth at bedtime.     Historical Provider, MD  Darbepoetin Alfa (ARANESP) 40 MCG/0.4ML SOSY injection Inject 0.4 mLs (40 mcg total) into the vein every Thursday with hemodialysis. 08/05/14   Geradine [place], DO  doxercalciferol (HECTOROL) 4 MCG/2ML injection Inject 1 mL (2 mcg total) into the vein Every Tuesday,Thursday,and Saturday with dialysis. 08/05/14   Geradine [place], DO  gabapentin (NEURONTIN) 100 MG capsule Take 100 mg by mouth daily.     Historical Provider, MD  guaiFENesin (MUCINEX) 600 MG 12 hr tablet Take 1 tablet (600 mg total) by mouth 2 (two) times daily. 01/07/15   Barton Dubois, MD  insulin glargine (LANTUS) 100 UNIT/ML injection Inject 0.07 mLs (7 Units total) into the skin 2 (two) times daily. 08/05/14   Geradine [place], DO  insulin lispro (HUMALOG KWIKPEN) 100 UNIT/ML KiwkPen Inject 6 Units into the skin 2 (two) times daily.     Historical Provider, MD  lidocaine (LIDODERM) 5 % Place 1 patch onto the skin daily. Remove & Discard patch within 12 hours or as directed by MD 12/06/14   Kelvin Cellar, MD  metoCLOPramide (REGLAN) 10 MG tablet Take 1 tablet (10 mg total) by mouth 4 (four) times daily -  before meals and at bedtime. 12/06/14   Kelvin Cellar, MD  metoprolol succinate (TOPROL-XL) 50 MG 24 hr tablet Take 1 tablet (50 mg  total) by mouth daily. Take with or immediately following a meal. 01/07/15   Barton Dubois, MD  montelukast (SINGULAIR) 10 MG tablet Take 10 mg by mouth at bedtime.    Historical Provider, MD  multivitamin (RENA-VIT) TABS tablet Take 1 tablet by mouth daily. 08/05/14   Geradine [place], DO  Nutritional Supplements (FEEDING SUPPLEMENT, NEPRO CARB STEADY,) LIQD Take 237 mLs by mouth 2 (two) times daily between meals. 04/23/14   Orson Eva, MD  Oxycodone HCl 10 MG TABS Take 10 mg by mouth 3 (three) times daily as needed (for pain).  03/23/15   Historical Provider, MD  oxyCODONE-acetaminophen (PERCOCET/ROXICET) 5-325 MG tablet Take 1-2 tablets by mouth every 6 (six) hours as needed for severe pain. 03/02/15   Angelia Mould, MD  pantoprazole (PROTONIX) 40 MG tablet Take 1 tablet (40 mg total) by mouth at bedtime. Patient taking differently: Take 40 mg by mouth daily. Takes daily in the afternoon 08/05/14   Geradine [place], DO  promethazine (PHENERGAN) 25 MG tablet Take 1 tablet (25 mg total) by mouth every 6 (six) hours as needed for nausea or vomiting. 12/06/14   Kelvin Cellar, MD  simvastatin (ZOCOR) 20 MG tablet Take 1 tablet (20 mg total) by mouth at bedtime. 05/20/14   Velvet Bathe, MD  warfarin (COUMADIN) 5 MG tablet Take 0.5 tablets (2.5 mg total) by mouth daily at 6 PM. Patient taking differently: Take 5-7.5 mg by mouth See admin instructions. Take 1 tablet on Tuesday, Thursday and Saturday then take 1 and 1/2 tablets on Monday, Wednesday, Friday and Sunday 01/23/15   Ulyses Amor, PA-C  zolpidem (AMBIEN) 5 MG tablet Take 5 mg by mouth at bedtime as needed. sleep 02/09/14   Historical Provider, MD   BP 140/56 mmHg  Pulse 106  Temp(Src) 100.5 F (38.1 C) (Oral)  Resp 22  SpO2 94%  LMP 09/30/2015 Physical Exam  Constitutional:  She is oriented to person, place, and time. She appears well-developed and well-nourished. No distress.  HENT:  Head: Normocephalic and atraumatic.  Nose: Nose  normal.  Mouth/Throat: Oropharynx is clear and moist. No oropharyngeal exudate.  Eyes: Conjunctivae and EOM are normal. Pupils are equal, round, and reactive to light. No scleral icterus.  Neck: Normal range of motion. Neck supple. No JVD present. No tracheal deviation present. No thyromegaly present.  Cardiovascular: Regular rhythm and normal heart sounds.  Exam reveals no gallop and no friction rub.   No murmur heard. tachycardia  Pulmonary/Chest: Effort normal and breath sounds normal. No respiratory distress. She has no wheezes. She exhibits no tenderness.  Abdominal: Soft. Bowel sounds are normal. She exhibits no distension and no mass. There is no tenderness. There is no rebound and no guarding.  Musculoskeletal: Normal range of motion. She exhibits no edema or tenderness.  LUE AVF with palpable thrill  Lymphadenopathy:    She has no cervical adenopathy.  Neurological: She is alert and oriented to person, place, and time. No cranial nerve deficit. She exhibits normal muscle tone.  Skin: Skin is warm and dry. No rash noted. No erythema. No pallor.  Tactile fever  Nursing note and vitals reviewed.   ED Course  Procedures (including critical care time) Labs Review Labs Reviewed  CBC WITH DIFFERENTIAL/PLATELET  COMPREHENSIVE METABOLIC PANEL  LIPASE, BLOOD  PROTIME-INR  I-STAT CHEM 8, ED  I-STAT CG4 LACTIC ACID, ED    Imaging Review No results found. I have personally reviewed and evaluated these images and lab results as part of my medical decision-making.   EKG Interpretation   Date/Time:  Friday October 28 2015 04:27:16 EDT Ventricular Rate:  105 PR Interval:  166 QRS Duration: 109 QT Interval:  384 QTC Calculation: 507 R Axis:   114 Text Interpretation:  Sinus tachycardia Right axis deviation Borderline  prolonged QT interval No significant change since last tracing Confirmed  by Glynn Octave [phone]) on 11/23/2015 4:45:39 AM      MDM   Final diagnoses:   None    Patient presents to the ED for severe abdominal pain with vomiting and diarrhea. She has fever here as well.  She has been compliant with dialysis.  She has multiple risk factors, so will assess with CT Abd/pel for further evaluation. She was given tylenol for fever, dilaudid for pain and reglan for nausea.    I was informed around 5:20am the patient's K+ was 6.5 and lactate was 12.  Patient was immediately ordered to have calcium and insulin.  Code sepsis was also called.  I also paged nephrology who agrees to dialyze the patient in the AM.   I now have concern for mesenteric ischemia, given her ESRD, and have changed CT to CTA.  Patient remains in critical condition.   I was called to the room around 6:05am for code blue.  Patient was found to be off of the monitor and had not received any of the medications previously ordered.  I presumed this was a hyperkalemic arrest.  She received 2 amps of Ca+, 2amps of bicarb, 1epi and 3 rounds of CPR.  ROSC was finally achieved. Repeat EKG is much more consistent with severe hyperK.  I called nephrology back for a 2nd time for STAT dialysis.  They rec to admit to ICU so they can do dialysis there.  I spoke with the ICU doctor who will accept the patient.  Dr. Titus Mould came down to the ED to  help me with the care of this patient.  She did brady down again to the 40s but never lost pulses.  Her HR climbed back to 70s after bicard and calcium again.  Patient remains very critical and needs dialysis as quickly as possible.  CRITICAL CARE Performed by: Everlene Balls   Total critical care time: 90 minutes - hyperkalemia req emergent dialysis  Critical care time was exclusive of separately billable procedures and treating other patients.  Critical care was necessary to treat or prevent imminent or life-threatening deterioration.  Critical care was time spent personally by me on the following activities: development of treatment plan with patient and/or  surrogate as well as nursing, discussions with consultants, evaluation of patient's response to treatment, examination of patient, obtaining history from patient or surrogate, ordering and performing treatments and interventions, ordering and review of laboratory studies, ordering and review of radiographic studies, pulse oximetry and re-evaluation of patient's condition.   Angiocath insertion Performed by: Everlene Balls  Consent: Verbal consent obtained. Risks and benefits: risks, benefits and alternatives were discussed Time out: Immediately prior to procedure a "time out" was called to verify the correct patient, procedure, equipment, support staff and site/side marked as required.  Preparation: Patient was prepped and draped in the usual sterile fashion.  Vein Location: R brachial vein  Ultrasound Guided  Gauge: 20G  Normal blood return and flush without difficulty Patient tolerance: Patient tolerated the procedure well with no immediate complications.       Everlene Balls, MD 11/15/2015 [phone]

## 2015-10-28 NOTE — Progress Notes (Signed)
Hypoglycemic Event  CBG: 66  Treatment: D50 IV 50 mL  Symptoms: None  Follow-up CBG: Time:1216 CBG Result:169  Possible Reasons for Event: unknown  Comments/MD notified:     Christepher Melchior K

## 2015-10-28 NOTE — Progress Notes (Signed)
Right lower leg IO removed,vaseline gauze dsg applied as well as left lower legg IO removed and vaseline gauze dsg applied.

## 2015-10-28 NOTE — Progress Notes (Signed)
[place] Progress Note Patient Name: [name] DOB: 12-Sep-1970 MRN: [id number]   Date of Service  11/17/2015  HPI/Events of Note  LA rising now 21  eICU Interventions  Continue CRRT, on vasopressors, prognosis poor No further recs at this time        [name] 11/09/2015, 9:13 PM

## 2015-10-28 NOTE — ED Notes (Signed)
Pt arrives by [facility] with c/o n/v since Sunday. Pt fell Sunday and hit eye on a chair. Left eye blood shot. Pt had dialysis yesterday with no complications. CBG was 442, RR 24, BP 160/70, HR 108, temporal temp 101. Pt has home O2 but machine is broken.

## 2015-10-28 NOTE — Progress Notes (Addendum)
ANTICOAGULATION and ANTIBIOTIC CONSULT NOTE - Initial Consult  Pharmacy Consult for heparin and vancomycin/meropenem Indication: h/o DVT and r/o ACS s/p cardiac arrest and sepsis  Allergies  Allergen Reactions  . Depakote [Divalproex Sodium] Other (See Comments)    Pt gets "delirious"  . Morphine And Related Nausea And Vomiting and Other (See Comments)    "makes me delirious"  . Penicillins Anaphylaxis    Tolerated Zosyn Dec 2014  . Tramadol Nausea And Vomiting  . Imitrex [Sumatriptan] Other (See Comments)    seizures  . Vicodin [Hydrocodone-Acetaminophen] Itching and Rash    Patient Measurements: Height: 5\' 1"  (154.9 cm) Weight: 156 lb 1.4 oz (70.8 kg) IBW/kg (Calculated) : 47.8 Heparin Dosing Weight: 65kg  Vital Signs: Temp: 100.5 F (38.1 C) (06/02 0422) Temp Source: Oral (06/02 0422) BP: 125/51 mmHg (06/02 0445) Pulse Rate: 45 (06/02 0552)  Labs:  Recent Labs  11/14/2015 0511 11/12/2015 0522  HGB 9.5* 12.2  HCT 31.9* 36.0  PLT 84*  --   LABPROT 24.0*  --   INR 2.17*  --   CREATININE 6.82* 6.80*    Estimated Creatinine Clearance: 9.6 mL/min (by C-G formula based on Cr of 6.8).   Medical History: Past Medical History  Diagnosis Date  . Hyperlipidemia   . Alopecia   . Obesity   . Iron deficiency   . RLS (restless legs syndrome)   . Injury of conjunctiva and corneal abrasion of right eye without foreign body   . Cellulitis   . Anemia   . Blood transfusion 2004    "when I had my transplant"  . Migraine   . Hyperparathyroidism, secondary ([place])   . Diabetic retinopathy   . Hypertension     takes Metoprolol and Exforge daily  . Depression     takes Klonopin nightly  . Diabetes mellitus type 1 (HCC)     Pt states diagnosed at age 22 with prior episodes of DKA. S/p pancreatic transplant   . GERD (gastroesophageal reflux disease)     takes Protonix daily  . Bronchitis   . Migraine   . Asthma   . Emphysema of lung ([place])   . Angina     none in past 2  years.  . Stomach pain     Hx: of chronic  . Pneumonia 2012    "double"  . Dialysis patient ([place])     tues,thurs,sat  . 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  Surgery was done at [facility].     . DKA (diabetic ketoacidoses) ([place]) 12/01/2014  . Diastolic CHF, acute on chronic ([place]) 01/02/2015  . Stroke [facility]) 03/2014    ? TIA  . ESRD (end stage renal disease) [facility])     S/p pancreatic and kidney transplant, but back on HD 2008, dialysis at [facility], [place], Sat    Medications:  Prescriptions prior to admission  Medication Sig Dispense Refill Last Dose  . albuterol (PROVENTIL HFA;VENTOLIN HFA) 108 (90 BASE) MCG/ACT inhaler Inhale 2 puffs into the lungs every 4 (four) hours as needed for wheezing or shortness of breath (as per home regimen). 1 Inhaler 0 Taking  . amLODipine (NORVASC) 10 MG tablet Take 10 mg by mouth at bedtime.   11 Taking  . aspirin 325 MG tablet Take 1 tablet (  325 mg total) by mouth daily. 30 tablet 0 Taking  . calcium acetate (PHOSLO) 667 MG capsule Take 1 capsule (667 mg total) by mouth 2 (two) times daily. (Patient taking differently: Take 2,001 mg by mouth 3 (three) times daily with meals. ) 30 capsule 0 Taking  . clonazePAM (KLONOPIN) 0.5 MG tablet Take 1 mg by mouth at bedtime.    Taking  . Darbepoetin Alfa (ARANESP) 40 MCG/0.4ML SOSY injection Inject 0.4 mLs (40 mcg total) into the vein every Thursday with hemodialysis. 8.4 mL  Taking  . doxercalciferol (HECTOROL) 4 MCG/2ML injection Inject 1 mL (2 mcg total) into the vein Every Tuesday,Thursday,and Saturday with dialysis. 2 mL  Taking  . gabapentin (NEURONTIN) 100 MG capsule Take 100 mg by mouth daily.    Taking  . guaiFENesin (MUCINEX) 600 MG 12 hr tablet Take 1 tablet (600 mg total) by mouth 2  (two) times daily. 30 tablet 0 Taking  . insulin glargine (LANTUS) 100 UNIT/ML injection Inject 0.07 mLs (7 Units total) into the skin 2 (two) times daily. 10 mL 11 Taking  . insulin lispro (HUMALOG KWIKPEN) 100 UNIT/ML KiwkPen Inject 6 Units into the skin 2 (two) times daily.    Taking  . lidocaine (LIDODERM) 5 % Place 1 patch onto the skin daily. Remove & Discard patch within 12 hours or as directed by MD 10 patch 0 Taking  . metoCLOPramide (REGLAN) 10 MG tablet Take 1 tablet (10 mg total) by mouth 4 (four) times daily -  before meals and at bedtime. 60 tablet 0 Taking  . metoprolol succinate (TOPROL-XL) 50 MG 24 hr tablet Take 1 tablet (50 mg total) by mouth daily. Take with or immediately following a meal. 30 tablet 1 Taking  . montelukast (SINGULAIR) 10 MG tablet Take 10 mg by mouth at bedtime.   Taking  . multivitamin (RENA-VIT) TABS tablet Take 1 tablet by mouth daily. 30 tablet 0 Taking  . Nutritional Supplements (FEEDING SUPPLEMENT, NEPRO CARB STEADY,) LIQD Take 237 mLs by mouth 2 (two) times daily between meals. 60 Can 0 Taking  . Oxycodone HCl 10 MG TABS Take 10 mg by mouth 3 (three) times daily as needed (for pain).   0 Taking  . oxyCODONE-acetaminophen (PERCOCET/ROXICET) 5-325 MG tablet Take 1-2 tablets by mouth every 6 (six) hours as needed for severe pain. 30 tablet 0 Taking  . pantoprazole (PROTONIX) 40 MG tablet Take 1 tablet (40 mg total) by mouth at bedtime. (Patient taking differently: Take 40 mg by mouth daily. Takes daily in the afternoon) 30 tablet 0 Taking  . promethazine (PHENERGAN) 25 MG tablet Take 1 tablet (25 mg total) by mouth every 6 (six) hours as needed for nausea or vomiting. 10 tablet 0 Taking  . simvastatin (ZOCOR) 20 MG tablet Take 1 tablet (20 mg total) by mouth at bedtime. 30 tablet 0 Taking  . warfarin (COUMADIN) 5 MG tablet Take 0.5 tablets (2.5 mg total) by mouth daily at 6 PM. (Patient taking differently: Take 5-7.5 mg by mouth See admin instructions. Take 1  tablet on Tuesday, Thursday and Saturday then take 1 and 1/2 tablets on Monday, Wednesday, Friday and Sunday) 60 tablet 0 Taking  . zolpidem (AMBIEN) 5 MG tablet Take 5 mg by mouth at bedtime as needed. sleep   Taking   Scheduled:  . sodium chloride  2,000 mL Intravenous Once  . acetaminophen  1,000 mg Oral Once  . albuterol      . artificial tears  1 application Both  Eyes Q8H  . aspirin  300 mg Rectal NOW  . calcium gluconate  2 g Intravenous Once  . cisatracurium  0.1 mg/kg Intravenous Once  . fentaNYL (SUBLIMAZE) injection  100 mcg Intravenous Once  . insulin aspart      . iopamidol      . [START ON 10/30/2015] levofloxacin (LEVAQUIN) IV  500 mg Intravenous Q48H  . levofloxacin (LEVAQUIN) IV  750 mg Intravenous Once  . metronidazole  500 mg Intravenous Q8H  . midazolam  2 mg Intravenous Once  . pantoprazole sodium  40 mg Per Tube Daily  . sodium bicarbonate      .  sodium bicarbonate 150 mEq in sterile water 1000 mL infusion   Intravenous Once  . sodium chloride  1,000 mL Intravenous Once   And  . sodium chloride  1,000 mL Intravenous Once   And  . sodium chloride  250 mL Intravenous Once   Infusions:  . cisatracurium (NIMBEX) infusion    . fentaNYL infusion INTRAVENOUS    . midazolam (VERSED) infusion    . norepinephrine (LEVOPHED) Adult infusion      Assessment: 45yo female arrested while in ED, now on hypothermia protocol, to begin heparin when INR <2, currently 2.17 as pt is on Coumadin PTA for h/o PE.  Also w/ bacteremia, to broaden IV ABX.  Goal of Therapy:  Heparin level 0.3-0.7 units/ml Pre-HD vanc level 15-25 Monitor platelets by anticoagulation protocol: Yes   Plan:  Will monitor INR and start heparin when <2.  Will give vancomycin 1500mg  IV x1 and f/u HD schedule prior to further dosing.  Will start meropenem 500mg  IV Q24H.  Wynona Neat, PharmD, BCPS  11/03/2015,7:17 AM

## 2015-10-28 NOTE — Consult Note (Signed)
ZYAN HOEY Admit Date: 11/22/2015 10/30/2015 Rexene Agent Requesting Physician:  Titus Mould  Reason for Consult:  ESRD, Hyperkalemia, Asystolic Arrest HPI:  [id number] ESRD [place] MWF presented to ED overnight /co abd pain.  Found to have hyperkalemia and acidosis and subsequently had asystolic arrest.  Post EKG with widened QRS.  Rec Insulin/BIcarb/Albuterol/ CaCl.  Hypotensive requiring pressors.  Intubated.  CCM placing femoral TDC.    YEsterday outpt HD Tx was full.     CREATININE, SER (mg/dL)  Date Value  11/18/2015 6.80*  10/27/2015 6.82*  04/12/2015 8.68*  04/10/2015 5.14*  04/09/2015 7.48*  04/07/2015 7.87*  04/06/2015 5.97*  04/05/2015 10.31*  04/05/2015 9.32*  04/03/2015 4.94*  ] I/Os:  ROS Balance of 12 systems is negative w/ exceptions as above  PMH  Past Medical History  Diagnosis Date  . Hyperlipidemia   . Alopecia   . Obesity   . Iron deficiency   . RLS (restless legs syndrome)   . Injury of conjunctiva and corneal abrasion of right eye without foreign body   . Cellulitis   . Anemia   . Blood transfusion 2004    "when I had my transplant"  . Migraine   . Hyperparathyroidism, secondary ([place])   . Diabetic retinopathy   . Hypertension     takes Metoprolol and Exforge daily  . Depression     takes Klonopin nightly  . Diabetes mellitus type 1 (HCC)     Pt states diagnosed at age 4 with prior episodes of DKA. S/p pancreatic transplant   . GERD (gastroesophageal reflux disease)     takes Protonix daily  . Bronchitis   . Migraine   . Asthma   . Emphysema of lung ([place])   . Angina     none in past 2 years.  . Stomach pain     Hx: of chronic  . Pneumonia 2012    "double"  . Dialysis patient ([place])     tues,thurs,sat  . 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  Surgery was done at [facility].     . DKA (diabetic ketoacidoses) ([place]) 12/01/2014  . Diastolic CHF, acute on chronic ([place]) 01/02/2015  . Stroke [facility]) 03/2014    ? TIA  . ESRD (end stage renal disease) [facility])     S/p pancreatic and kidney transplant, but back on HD 2008, dialysis at [facility], [place], Sat   [facility]  Past Surgical History  Procedure Laterality Date  . 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 Thyroglobulin      x 7  . Av fistula placement      right upper arm  . Cholecystectomy  1995  .  hd graft placement/removal      "had 2 in my left upper arm"  . Eye surgery    . Retinopathy surgery    . Tooth extraction  10/10/11    X's two  . Insertion of dialysis catheter  12/08/2011    Procedure: INSERTION OF DIALYSIS CATHETER;  Surgeon: Angelia Mould, MD;  Location: [place];  Service: Vascular;  Laterality: Left;  . Av fistula placement  01/22/2012    Procedure: INSERTION OF ARTERIOVENOUS (AV) GORE-TEX GRAFT ARM;  Surgeon: Angelia Mould, MD;  [facility] OR;  Service: Vascular;  Laterality: Left;  Ultrasound guided.  . Av fistula placement  03/14/2012    Procedure: INSERTION OF ARTERIOVENOUS (AV) GORE-TEX GRAFT THIGH;  Surgeon: Angelia Mould, MD;  Location: [place];  Service: Vascular;  Laterality: Right;  . False aneurysm repair Right 01/02/2013    Procedure: Excision of lymphocele in right thigh.;  Surgeon: Angelia Mould, MD;  Location: [place];  Service: Vascular;  Laterality: Right;  . Carpal tunnel release Right 03/02/2013    Procedure: CARPAL TUNNEL RELEASE;  Surgeon: Tennis Must, MD;  Location: [place];  Service: Orthopedics;  Laterality: Right;  . Flexible sigmoidoscopy N/A 03/24/2013    Procedure: FLEXIBLE SIGMOIDOSCOPY;  Surgeon: Cleotis Nipper, MD;  Location: [facility] ENDOSCOPY;  Service: Endoscopy;  Laterality: N/A;  . Revision of arteriovenous goretex graft Right  04/02/2013    Procedure: REPAIR OF PSEUDOANEURYSM OF ARTERIOVENOUS GORETEX GRAFT;  Surgeon: Angelia Mould, MD;  Location: [place];  Service: Vascular;  Laterality: Right;  . Carpal tunnel release Left 10/16/2013    Procedure: LEFT CARPAL TUNNEL RELEASE;  Surgeon: Tennis Must, MD;  Location: [place];  Service: Orthopedics;  Laterality: Left;  . Unilateral upper extremeity angiogram N/A 11/12/2011    Procedure: UNILATERAL UPPER Anselmo Rod;  Surgeon: Angelia Mould, MD;  Location: [facility] CATH LAB;  Service: Cardiovascular;  Laterality: N/A;  . Fistulogram Left 03/03/2012    Procedure: FISTULOGRAM;  Surgeon: Angelia Mould, MD;  Location: [facility] CATH LAB;  Service: Cardiovascular;  Laterality: Left;  . Av fistula placement Left 01/14/2015    Procedure: INSERTION OF LEFT ARTERIOVENOUS (AV) GORE-TEX GRAFT THIGH;  Surgeon: Angelia Mould, MD;  Location: [place];  Service: Vascular;  Laterality: Left;  . Tee without cardioversion N/A 04/05/2015    Procedure: TRANSESOPHAGEAL ECHOCARDIOGRAM (TEE);  Surgeon: Skeet Latch, MD;  Location: [facility] ENDOSCOPY;  Service: Cardiovascular;  Laterality: N/A;  . Lymph node biopsy Right 04/11/2015    Procedure: RIGHT SUPRACLAVICULAR LYMPH NODE BIOPSY;  Surgeon: Melrose Nakayama, MD;  Location: [place];  Service: Vascular;  Laterality: Right;   FH  Family History  Problem Relation Age of Onset  . Hypertension Mother   . Kidney disease Mother   . Diabetes Mother   . Hypertension Father   . Kidney disease Father   . Diabetes Father    SH  reports that she has never smoked. She has never used smokeless tobacco. She reports that she does not drink alcohol or use illicit drugs. Allergies  Allergies  Allergen Reactions  . Depakote [Divalproex Sodium] Other (See Comments)    Pt gets "delirious"  . Morphine And Related Nausea And Vomiting and Other (See Comments)    "makes me delirious"  . Penicillins Anaphylaxis     Tolerated Zosyn Dec 2014  . Tramadol Nausea And Vomiting  . Imitrex [Sumatriptan] Other (See Comments)    seizures  . Vicodin [Hydrocodone-Acetaminophen] Itching and Rash   Home medications Prior to Admission medications   Medication Sig Start Date End Date Taking? Authorizing Provider  albuterol (PROVENTIL HFA;VENTOLIN HFA) 108 (90 BASE) MCG/ACT inhaler Inhale 2 puffs into the lungs every 4 (four) hours as needed for wheezing or shortness of breath (as per home regimen). 02/20/13   Kinnie Feil, MD  amLODipine (NORVASC) 10 MG tablet Take 10 mg by mouth at bedtime.  11/17/14   Historical Provider, MD  aspirin 325 MG tablet Take 1 tablet (325 mg total) by  mouth daily. 05/20/14   Velvet Bathe, MD  calcium acetate (PHOSLO) 667 MG capsule Take 1 capsule (667 mg total) by mouth 2 (two) times daily. Patient taking differently: Take 2,001 mg by mouth 3 (three) times daily with meals.  11/16/14   Theodis Blaze, MD  clonazePAM (KLONOPIN) 0.5 MG tablet Take 1 mg by mouth at bedtime.     Historical Provider, MD  Darbepoetin Alfa (ARANESP) 40 MCG/0.4ML SOSY injection Inject 0.4 mLs (40 mcg total) into the vein every Thursday with hemodialysis. 08/05/14   Geradine Girt, DO  doxercalciferol (HECTOROL) 4 MCG/2ML injection Inject 1 mL (2 mcg total) into the vein Every Tuesday,Thursday,and Saturday with dialysis. 08/05/14   Geradine Girt, DO  gabapentin (NEURONTIN) 100 MG capsule Take 100 mg by mouth daily.     Historical Provider, MD  guaiFENesin (MUCINEX) 600 MG 12 hr tablet Take 1 tablet (600 mg total) by mouth 2 (two) times daily. 01/07/15   Barton Dubois, MD  insulin glargine (LANTUS) 100 UNIT/ML injection Inject 0.07 mLs (7 Units total) into the skin 2 (two) times daily. 08/05/14   Geradine Girt, DO  insulin lispro (HUMALOG KWIKPEN) 100 UNIT/ML KiwkPen Inject 6 Units into the skin 2 (two) times daily.     Historical Provider, MD  lidocaine (LIDODERM) 5 % Place 1 patch onto the skin daily. Remove & Discard  patch within 12 hours or as directed by MD 12/06/14   Kelvin Cellar, MD  metoCLOPramide (REGLAN) 10 MG tablet Take 1 tablet (10 mg total) by mouth 4 (four) times daily -  before meals and at bedtime. 12/06/14   Kelvin Cellar, MD  metoprolol succinate (TOPROL-XL) 50 MG 24 hr tablet Take 1 tablet (50 mg total) by mouth daily. Take with or immediately following a meal. 01/07/15   Barton Dubois, MD  montelukast (SINGULAIR) 10 MG tablet Take 10 mg by mouth at bedtime.    Historical Provider, MD  multivitamin (RENA-VIT) TABS tablet Take 1 tablet by mouth daily. 08/05/14   Geradine Girt, DO  Nutritional Supplements (FEEDING SUPPLEMENT, NEPRO CARB STEADY,) LIQD Take 237 mLs by mouth 2 (two) times daily between meals. 04/23/14   Orson Eva, MD  Oxycodone HCl 10 MG TABS Take 10 mg by mouth 3 (three) times daily as needed (for pain).  03/23/15   Historical Provider, MD  oxyCODONE-acetaminophen (PERCOCET/ROXICET) 5-325 MG tablet Take 1-2 tablets by mouth every 6 (six) hours as needed for severe pain. 03/02/15   Angelia Mould, MD  pantoprazole (PROTONIX) 40 MG tablet Take 1 tablet (40 mg total) by mouth at bedtime. Patient taking differently: Take 40 mg by mouth daily. Takes daily in the afternoon 08/05/14   Geradine Girt, DO  promethazine (PHENERGAN) 25 MG tablet Take 1 tablet (25 mg total) by mouth every 6 (six) hours as needed for nausea or vomiting. 12/06/14   Kelvin Cellar, MD  simvastatin (ZOCOR) 20 MG tablet Take 1 tablet (20 mg total) by mouth at bedtime. 05/20/14   Velvet Bathe, MD  warfarin (COUMADIN) 5 MG tablet Take 0.5 tablets (2.5 mg total) by mouth daily at 6 PM. Patient taking differently: Take 5-7.5 mg by mouth See admin instructions. Take 1 tablet on Tuesday, Thursday and Saturday then take 1 and 1/2 tablets on Monday, Wednesday, Friday and Sunday 01/23/15   Ulyses Amor, PA-C  zolpidem (AMBIEN) 5 MG tablet Take 5 mg by mouth at bedtime as needed. sleep 02/09/14   Historical Provider, MD  Current Medications Scheduled Meds: . sodium chloride  2,000 mL Intravenous Once  . sodium chloride  2,000 mL Intravenous Once  . acetaminophen  1,000 mg Oral Once  . albuterol      . artificial tears  1 application Both Eyes [id number]  . aspirin  300 mg Rectal NOW  . calcium gluconate  2 g Intravenous Once  . cisatracurium  0.1 mg/kg Intravenous Once  . fentaNYL (SUBLIMAZE) injection  100 mcg Intravenous Once  . insulin aspart      . iopamidol      . [START ON 10/30/2015] levofloxacin (LEVAQUIN) IV  500 mg Intravenous Q48H  . levofloxacin (LEVAQUIN) IV  750 mg Intravenous Once  . meropenem (MERREM) IV  500 mg Intravenous Q24H  . metronidazole  500 mg Intravenous Q8H  . midazolam  2 mg Intravenous Once  . pantoprazole sodium  40 mg Per Tube Daily  . phenylephrine (NEO-SYNEPHRINE) Adult infusion  0-400 mcg/min Intravenous Once  . sodium bicarbonate      .  sodium bicarbonate 150 mEq in sterile water 1000 mL infusion   Intravenous Once  . sodium chloride  1,000 mL Intravenous Once   And  . sodium chloride  1,000 mL Intravenous Once   And  . sodium chloride  250 mL Intravenous Once  . vancomycin  1,500 mg Intravenous Once   Continuous Infusions: . cisatracurium (NIMBEX) infusion    . fentaNYL infusion INTRAVENOUS    . midazolam (VERSED) infusion    . norepinephrine (LEVOPHED) Adult infusion    .  sodium bicarbonate  infusion 1000 mL     PRN Meds:.cisatracurium **AND** cisatracurium (NIMBEX) infusion **AND** cisatracurium, fentaNYL, fentaNYL (SUBLIMAZE) injection, fentaNYL (SUBLIMAZE) injection, midazolam, midazolam  CBC  Recent Labs Lab 10/30/2015 0511 11/04/2015 0522  WBC 12.4*  --   NEUTROABS 11.0*  --   HGB 9.5* 12.2  HCT 31.9* 36.0  MCV 93.8  --   PLT 84*  --    Basic Metabolic Panel  Recent Labs Lab 11/25/2015 0511 11/12/2015 0522  NA 130* 128*  K 6.7* 6.5*  CL 88* 93*  CO2 14*  --   GLUCOSE 419* 421*  BUN 34* 36*  CREATININE 6.82* 6.80*  CALCIUM 7.7*  --      Physical Exam  Blood pressure 125/51, pulse 45, temperature 100.5 F (38.1 C), temperature source Oral, resp. rate 0, height 5\' 1"  (1.549 m), weight 70.8 kg (156 lb 1.4 oz), last menstrual period 09/30/2015, SpO2 78 %, unknown if currently breastfeeding. GEN: intubated, unresponsive ENT: ETT in place EYES: eyes closed CV: tachy, regular PULM: coarse bs b/ ABD: mild distension, firm SKIN: no rashes/esions EXT:RLE AVG weak Thrill   Assessment 53F ESRD presenting with abd pain developing asystolic arrest, hyperkalemia, lactic acidosis, and shock  1. Asystolic Arrest 2. Hyperkalemia 3. Lactic Acidosis 4. Shock, likely septic 5. ESRD MWF [place], last HD 6/1   Plan 1. CCM placing temp HD cath, plan for emergent HD -- attemp iHD to hasten K clearance but likely will need ot transition to CRRT 2. Qb 250, inc as able, 1K bath, hourly K, no UF 3. Re-eval for CRRT based on response to [facility] and clinical status   Pearson Grippe MD [phone] pgr 11/24/2015, 7:44 AM

## 2015-10-28 NOTE — Progress Notes (Signed)
Pt pressure dropped to map 30s, CCM orders to give 2 amp bicarb and 1 epi, also to start epi drip. Will continue to monitor closely.

## 2015-10-28 NOTE — Progress Notes (Signed)
[facility] PCCM  Bedside RN called PCCM for dropping BP.  After HD, BP started trending down. SBP 40 mm Hg. O2 sats 50%.  Pt seen, unresponsive, cold and clammy. Intubated. Rhonchi in bibasilar. Good s1/s2. Dec BS. Gr 2 edema.   We gave 1 mg epi IV. Plan to start epi drip if BP continues to drop.  BP now [id number] systolic post 1 mg epi IV.  Cont Neosynephrine and levophed drip. On Maximum doses.  Also received 1 amp of NaHCO3.   Mother is listed as Next of kin but mother is deceased.  We were able to speak with a friend Antwone and she said pt has a POA Engineer, structural.  No number for Clarene Critchley but Payton Spark will try to get hold of Clarene Critchley.  Pt allegedly has a daughter Prudencio Burly [phone] -- we tried calling but no answer.  Told RN to let case manager be involved to get hold of family.    Monica Becton, MD 11/05/2015, 12:59 PM  Pulmonary and Critical Care Pager [phone] After 3 pm or if no answer, call [phone]

## 2015-10-28 NOTE — Progress Notes (Signed)
Call placed to CDS at 1315.  After the coordinator called back to inquire about pt, it was determined that pt is not a candidate for further CDS review.  Bedside RN Lexi made aware.

## 2015-10-28 NOTE — Code Documentation (Signed)
Lab notified RN that pt was unresponsive. Staff at station went to pt's room. Pt had removed monitoring devices and was unresponsive. Compressions immediately started, MD called for and code alert activated.

## 2015-10-28 NOTE — H&P (Addendum)
PULMONARY / CRITICAL CARE MEDICINE   Name: [name] MRN: [id number] DOB: [birth year]    ADMISSION DATE:  05-Nov-2015   REFERRING MD:  edp  CHIEF COMPLAINT:  Abd pain / cardiac arrest  HISTORY OF PRESENT ILLNESS:   45 yo AAF, well known to PCCM service. Last seen 8/15 and required intubation. Of note she has no IV acces left.  On last admit fem cvl was placed left femoral which was recently converted to her last av fistula spot.  She presents 11/05/2022 with 2 say of abd pain and fever. She is ESRD with HD T TH Sat for which she has been compliant. Her K+ was 6.8 and she was to have HD 05-Nov-2022 but she coded and required CPR, epi, ca++, bicarb with ROSC. Lactic acid 12, K=6.8. She was transported to CCU and placed on Normothermia protocol, HD instituted, Vent rate increase to 35. Rt I J angiocath placed after failed attempt at IJ due to thrombosis. Left fem hd cath and a line inserted by Dr. Titus Mould.  Her prognosis is grim. If she stabilizing then CT of abd would be in ordered.  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  Allergies  Allergen Reactions  . Depakote [Divalproex Sodium] Other (See Comments)    Pt gets "delirious"  . Morphine And Related Nausea And Vomiting and Other (See Comments)    "makes me delirious"  . Penicillins Anaphylaxis    Tolerated Zosyn Dec 2014  . Tramadol Nausea And Vomiting  . Imitrex [Sumatriptan] Other (See Comments)    seizures  . Vicodin [Hydrocodone-Acetaminophen] Itching and Rash    No current facility-administered medications on file prior to encounter.   Current Outpatient Prescriptions on File Prior to Encounter  Medication Sig  . albuterol (PROVENTIL HFA;VENTOLIN HFA) 108 (90 BASE) MCG/ACT inhaler Inhale 2 puffs into the lungs every 4 (four) hours as needed for wheezing or shortness of breath (as per home regimen).  Marland Kitchen amLODipine (NORVASC) 10 MG tablet Take 10 mg by mouth at bedtime.   Marland Kitchen aspirin 325 MG tablet Take 1 tablet (325 mg total) by mouth daily.  . calcium acetate (PHOSLO) 667 MG capsule Take 1 capsule (667 mg total) by mouth 2 (two) times daily. (Patient taking differently: Take 2,001 mg by mouth 3 (three) times daily with meals. )  . clonazePAM (KLONOPIN) 0.5 MG tablet Take 1  mg by mouth at bedtime.   . Darbepoetin Alfa (ARANESP) 40 MCG/0.4ML SOSY injection Inject 0.4 mLs (40 mcg total) into the vein every  Thursday with hemodialysis.  Marland Kitchen doxercalciferol (HECTOROL) 4 MCG/2ML injection Inject 1 mL (2 mcg total) into the vein Every Tuesday,Thursday,and Saturday with dialysis.  Marland Kitchen gabapentin (NEURONTIN) 100 MG capsule Take 100 mg by mouth daily.   Marland Kitchen guaiFENesin (MUCINEX) 600 MG 12 hr tablet Take 1 tablet (600 mg total) by mouth 2 (two) times daily.  . insulin glargine (LANTUS) 100 UNIT/ML injection Inject 0.07 mLs (7 Units total) into the skin 2 (two) times daily.  . insulin lispro (HUMALOG KWIKPEN) 100 UNIT/ML KiwkPen Inject 6 Units into the skin 2 (two) times daily.   Marland Kitchen lidocaine (LIDODERM) 5 % Place 1 patch onto the skin daily. Remove & Discard patch within 12 hours or as directed by MD  . metoCLOPramide (REGLAN) 10 MG tablet Take 1 tablet (10 mg total) by mouth 4 (four) times daily -  before meals and at bedtime.  . metoprolol succinate (TOPROL-XL) 50 MG 24 hr tablet Take 1 tablet (50 mg total) by mouth daily. Take with or immediately following a meal.  . montelukast (SINGULAIR) 10 MG tablet Take 10 mg by mouth at bedtime.  . multivitamin (RENA-VIT) TABS tablet Take 1 tablet by mouth daily.  . Nutritional Supplements (FEEDING SUPPLEMENT, NEPRO CARB STEADY,) LIQD Take 237 mLs by mouth 2 (two) times daily between meals.  . Oxycodone HCl 10 MG TABS Take 10 mg by mouth 3 (three) times daily as needed (for pain).   Marland Kitchen oxyCODONE-acetaminophen (PERCOCET/ROXICET) 5-325 MG tablet Take 1-2 tablets by mouth every 6 (six) hours as needed for severe pain.  . pantoprazole (PROTONIX) 40 MG tablet Take 1 tablet (40 mg total) by mouth at bedtime. (Patient taking differently: Take 40 mg by mouth daily. Takes daily in the afternoon)  . promethazine (PHENERGAN) 25 MG tablet Take 1 tablet (25 mg total) by mouth every 6 (six) hours as needed for nausea or vomiting.  . simvastatin (ZOCOR) 20 MG tablet Take 1 tablet (20 mg total) by mouth at bedtime.  Marland Kitchen warfarin (COUMADIN) 5 MG tablet Take 0.5 tablets (2.5 mg total) by mouth  daily at 6 PM. (Patient taking differently: Take 5-7.5 mg by mouth See admin instructions. Take 1 tablet on Tuesday, Thursday and Saturday then take 1 and 1/2 tablets on Monday, Wednesday, Friday and Sunday)  . zolpidem (AMBIEN) 5 MG tablet Take 5 mg by mouth at bedtime as needed. sleep    FAMILY HISTORY:  Her indicated that her mother is deceased. She indicated that her father is alive.   SOCIAL HISTORY: She  reports that she has never smoked. She has never used smokeless tobacco. She reports that she does not drink alcohol or use illicit drugs.  REVIEW OF SYSTEMS:   NA  SUBJECTIVE:  Post arrest  VITAL SIGNS: BP 165/72 mmHg  Pulse 137  Temp(Src) 98.8 F (37.1 C) (Oral)  Resp 30  Ht 5\' 1"  (1.549 m)  Wt 156 lb 1.4 oz (70.8 kg)  BMI 29.51 kg/m2  SpO2 100%  LMP 09/30/2015  HEMODYNAMICS:    VENTILATOR SETTINGS: Vent Mode:  [-] PRVC FiO2 (%):  [100 %] 100 % Set Rate:  [18 bmp-35 bmp] 35 bmp Vt Set:  [380 mL-500 mL] 380 mL PEEP:  [5 cmH20-8 cmH20] 8 cmH20 Plateau Pressure:  [27 cmH20] 27 cmH20  INTAKE / OUTPUT:    PHYSICAL EXAMINATION: General:  Obese AAF ,  non responsive Neuro: Sedated and with NMB. Pupils fixed and dilated  HEENT:  No neck, + Neck LAN Cardiovascular:  HSD ST 130 Lungs:  Coarse rhonchi bilat Abdomen:  N bs, firm Musculoskeletal:  intact Skin:  Cool, mottled  LABS:  BMET  Recent Labs Lab 11/07/2015 0511 11/02/2015 0522  NA 130* 128*  K 6.7* 6.5*  CL 88* 93*  CO2 14*  --   BUN 34* 36*  CREATININE 6.82* 6.80*  GLUCOSE 419* 421*    Electrolytes  Recent Labs Lab 11/09/2015 0511  CALCIUM 7.7*    CBC  Recent Labs Lab 11/24/2015 0511 10/31/2015 0522  WBC 12.4*  --   HGB 9.5* 12.2  HCT 31.9* 36.0  PLT 84*  --     Coag's  Recent Labs Lab 11/06/2015 0511  INR 2.17*    Sepsis Markers  Recent Labs Lab 11/15/2015 0535  LATICACIDVEN 12.21*    ABG No results for input(s): PHART, PCO2ART, PO2ART in the last 168 hours.  Liver  Enzymes  Recent Labs Lab 11/22/2015 0511  AST 107*  ALT 30  ALKPHOS 192*  BILITOT 4.8*  ALBUMIN 3.0*    Cardiac Enzymes No results for input(s): TROPONINI, PROBNP in the last 168 hours.  Glucose  Recent Labs Lab 11/12/2015 0826  GLUCAP 135*    Imaging Dg Chest Portable 1 View  11/07/2015  CLINICAL DATA:  Status post CPR with endotracheal tube placement EXAM: PORTABLE CHEST 1 VIEW COMPARISON:  04/04/2015 FINDINGS: Endotracheal tube tip about 2.5 cm above the carina. NG tube crosses the gastroesophageal junction. Right upper lobe is diffusely consolidated, new from the prior study. Lungs otherwise clear. Stable mild cardiac enlargement. IMPRESSION: Support devices as described. New diffuse right upper lobe consolidation. Electronically Signed   By: Skipper Cliche M.D.   On: 10/27/2015 07:09     STUDIES:    CULTURES: 6/2 bc x 2>> 6/2 sputum>> 6/2 procal>>  ANTIBIOTICS: 6/2 flagyl>> 6/2 vanc>> 6/2 Levaquin>> 6/2  Meropenem>>  SIGNIFICANT EVENTS: 6/2 cardiac areest  LINES/TUBES: 6/2 ett>> 6/2 left fem aline>> 6/2 a line l fem>> 6/2 rt i j Angiocath>>  DISCUSSION: 5 es rd, es vasculopath with 2 days of abd pain and coded in ED.  ASSESSMENT / PLAN:  PULMONARY A: VDRF post cardiac arrest Rt Upper lobe consolidation  P:   Vent bundle RR 35 t0 offset metabolic acidosis  SERIAL ABG'S Abx  CARDIOVASCULAR A:  Post cardiac arrest CHF Hx of DVT End stage vasculopath Shock presumed combination cardiogenic abd possible sepsis P:  Pressor support Correct acidosis as possible Normothermia protocol  RENAL A:   ESRD post failed renal transplant Metabolic acidosis  HD T TH SAT P:   CVVH to correct acidosis and hyperkalemia   GASTROINTESTINAL A:   Hx of Gerd ? Ischemic bowel  P:   PPI OGT CT abd if able  HEMATOLOGIC A:   Vasculopath end stage Seen by PCCM 8/15 with line placed in left femoral whic has been converted to AV fistula Coumadin with  INR 2 for hx dvt  P:  RT I J Angiocath placed along  With left fem hd and aline via graft Hold anticoagulation for now  INFECTIOUS A:   Presumed abd source of infection  P:   See abx and culture section She will need CT abd Check LDH/Lipase  ENDOCRINE A:   DM Pancreas transpalnt P:   SSI  NEUROLOGIC A:   Intact prior to cardiac arrest in ED Hx left pontine cva  P:   RASS goal: -1 while intubated Sedate as needed  CT hed in future   FAMILY  - Updates: None at bedside  - Inter-disciplinary family meet or Palliative Care meeting due by: June 8th   Steve Minor ACNP [facility] PCCM Pager [phone] till 3 pm If no answer page [phone] 11/18/2015, 8:55 AM   STAFF NOTE: I, Merrie Roof, MD FACP have personally reviewed patient's available data, including medical history, events of note, physical examination and test results as part of my evaluation. I have discussed with resident/NP and other care providers such as pharmacist, RN and RRT. In addition, I personally evaluated patient and elicited key findings of: I saw her in ER and escorted her to [place], unresponsive, perrl, PEA 15 min , chronic scars, slight thrill rt fem area, appears as end stage access, code secondary to hyperkalemia, concern is underlying abdominal sepsis / ischemia driving lactic acid and arrest  As she was compliant with HD, ABG STAT, stat A line placed, STAT HD cath in only place could place graft likely left fem area, emergent HD, d/w renal, sepsis fluids, abx, repeat assessments sepsis see below, repeat lactic, get ldh, ct abdo if stable after HD to assess bowels ischemia and sepsis source, pcn all, stat levo aztreonam, vanc, flagy, consider empiric antifungal, repeat abg on rising rate vent, needs high MV,  Repeat ca, bicarb insulin in er for re occurrent brady near re arrest, ensure 30 cc/kg given, get cvp line if able unlikely, no feeding as of now with abdomen, would change hypothermia to normothermia  with sepsis concerns, shock, levophed to map goal started, I also am calling a number of daughter now to update The patient is critically ill with multiple organ systems failure and requires high complexity decision making for assessment and support, frequent evaluation and titration of therapies, application of advanced monitoring technologies and extensive interpretation of multiple databases.   Critical Care Time devoted to patient care services described in this note is 120 Minutes. This time reflects time of care of this signee: Merrie Roof, MD FACP. This critical care time does not reflect procedure time, or teaching time or supervisory time of PA/NP/Med student/Med Resident etc but could involve care discussion time. Rest per NP/medical resident whose note is outlined above and that I agree with   Lavon Paganini. Titus Mould, MD, [place] Pgr: [facility] 11/12/2015 11:09 AM   Sepsis - Repeat Assessment  Performed at:    630 am 6/2  Vitals     Blood pressure 148/62, pulse 100, temperature 96.1 F (35.6 C), temperature source Core (Comment), resp. rate 35, height 5\' 1"  (1.549 m), weight 73.1 kg (161 lb 2.5 oz), last menstrual period 09/30/2015, SpO2 99 %, unknown if currently breastfeeding.  Heart:     Irregular rate and rhythm  Lungs:    Rhonchi  Capillary Refill:   > 2 sec  Peripheral Pulse:   Radial pulse palpable  Skin:     Mottled

## 2015-10-28 NOTE — Progress Notes (Signed)
Pt remains septic w shock.  K gradually rising again after HD this am, prob due to continued cell injury and worsening metabolic acidosis. Plan is for CRRT, if she will tolerate it.  Have d/w staff.   Kelly Splinter MD [employer] pager [phone]    cell [phone] 11/11/2015, 4:11 PM

## 2015-10-28 NOTE — Progress Notes (Signed)
7 rings and 1 earring removed from patient and given to patients Daughter.

## 2015-10-28 NOTE — Code Documentation (Signed)
0600

## 2015-10-28 NOTE — Procedures (Signed)
HD in progress, 1.5 of 3 hrs completed.  QRS is back to narrow on telemetry, istat K pending now.  Pt is at dry wt, not removing fluid.  Central line being placed now.  Recheck bmet post HD, consider CRRT.    Kelly Splinter MD [facility] pager [phone]    cell [phone] 10/31/2015, 9:38 AM

## 2015-10-28 NOTE — Procedures (Signed)
Central Venous Catheter Insertion Procedure Note SAMMARA WAREHAM [id number] 04-05-1971  Procedure: Insertion of Central Venous Catheter Indications: Assessment of intravascular volume  Procedure Details Consent: Unable to obtain consent because of emergent medical necessity. Time Out: Verified patient identification, verified procedure, site/side was marked, verified correct patient position, special equipment/implants available, medications/allergies/relevent history reviewed, required imaging and test results available.  Performed  Maximum sterile technique was used including antiseptics, cap, gloves, gown, hand hygiene, mask and sheet. Skin prep: Chlorhexidine; local anesthetic administered A antimicrobial bonded/coated single lumen catheter was placed in the right internal jugular vein using the Seldinger technique. Ultrasound guidance used.Yes.   Catheter placed to 3 cm. Blood aspirated via all 3 ports and then flushed x 3. Line sutured x 2 and dressing applied.  Evaluation Blood flow good Complications: No apparent complications Patient did tolerate procedure well. Chest X-ray ordered to verify placement.  CXR: normal.  Richardson Landry Minor ACNP [facility] PCCM Pager [phone] till 3 pm If no answer page [phone] 11/18/2015, 10:26 AM   Stenosis, able to put shorter sheeth  Lavon Paganini. Titus Mould, MD, [place] Pgr: [facility]

## 2015-10-28 NOTE — Code Documentation (Signed)
After pulse reestablished, staff worked on establishing IV access, cleaning around patient and monitoring pt's condition.

## 2015-10-28 NOTE — Code Documentation (Signed)
Cryotherapy initiated with ice bags in groin and axilla per verbal order from Titus Mould, MD.

## 2015-10-28 NOTE — Code Documentation (Signed)
No vital signs documented because information was cleared from monitor device prior to RN having an opportunity to validate data.

## 2015-10-28 NOTE — Code Documentation (Signed)
RN calling report to receiving RN on 2H. Pt transport started. Pt's HR began to drop again. Meds administered per documentation [id number]. HR stabilized and pt transported to [facility] with Titus Mould MD and ED staff.

## 2015-10-28 NOTE — Progress Notes (Addendum)
Inpatient Diabetes Program Recommendations  AACE/ADA: New Consensus Statement on Inpatient Glycemic Control (2015)  Target Ranges:  Prepandial:   less than 140 mg/dL      Peak postprandial:   less than 180 mg/dL (1-2 hours)      Critically ill patients:  140 - 180 mg/dL   Review of Glycemic Control  Diabetes history: DM 2 Outpatient Diabetes medications: Lantus 7 units BID, Humalog 6 units BID with meals Current orders for Inpatient glycemic control: None  Inpatient Diabetes Program Recommendations:   Patient found unresponsive this am. Glucose was in the 400's, Noted Novolog 10 units given this am. Patient takes insulin at home. Please consider placing patient on the ICU protocol for glucose control. Consider obtaining CBG and possibly initiating IV insulin.  Thanks,  Tama Headings RN, MSN, [facility] Inpatient Diabetes Coordinator Team Pager [phone] (8a-5p)

## 2015-10-28 NOTE — Procedures (Deleted)
HD in progress, 1.5 of 3 hrs completed.  QRS is back to narrow on telemetry, istat K pending now.  Pt is at dry wt, not removing fluid.  Central line being placed now.  Recheck bmet post HD, consider CRRT.    Kelly Splinter MD [facility] pager [phone]    cell [phone] 11/23/2015, 9:37 AM

## 2015-10-28 NOTE — Progress Notes (Signed)
Pharmacy Antibiotic Note  [name] is a 45 y.o. female admitted on 11/05/2015 with intra-abdominal infection.  Pharmacy has been consulted for levaquin and flagyl dosing.  Pt ESRD on HD.    Plan:  flagyl 500 mg IV q8h levaquin 750 mg IV x 1 dose followed by 500 mg IV q48hours  Temp (24hrs), Avg:100.5 F (38.1 C), Min:100.5 F (38.1 C), Max:100.5 F (38.1 C)   Recent Labs Lab 10/27/2015 0522 11/16/2015 0535  CREATININE 6.80*  --   LATICACIDVEN  --  12.21*    CrCl cannot be calculated (Unknown ideal weight.).    Allergies  Allergen Reactions  . Depakote [Divalproex Sodium] Other (See Comments)    Pt gets "delirious"  . Morphine And Related Nausea And Vomiting and Other (See Comments)    "makes me delirious"  . Penicillins Anaphylaxis    Tolerated Zosyn Dec 2014  . Tramadol Nausea And Vomiting  . Imitrex [Sumatriptan] Other (See Comments)    seizures  . Vicodin [Hydrocodone-Acetaminophen] Itching and Rash   Thank you for allowing pharmacy to be a part of this patient's care.  Eudelia Bunch, Pharm.D. [phone] 11/22/2015 6:00 AM

## 2015-10-28 NOTE — Progress Notes (Signed)
Pharmacy: Antibiotic dosing for CRRT  45yof s/p cardiac arrest started on vancomycin and meropenem today for septic shock. She was scheduled to receive 3 hours of hemodialysis (BFR 250) this morning for hyperkalemia but could only tolerate 1.5 hours due to hypotension - phenylephrine and norepinephrine started. Her potassium continues to increase now so she will begin CRRT.  Last antibiotic doses: Vancomycin 1500mg  @ 1002 Meropenem 500mg  @ 1158  Plan: Start vancomycin 750mg  IV q24 to begin tomorrow Change meropenem to 1g IV q12 Anidulafungin dose ok  Nena Jordan, PharmD, BCPS 11/14/2015, 4:30 PM

## 2015-10-28 NOTE — Clinical Social Work Note (Signed)
LCSW contacted by [facility] to assist with locating patient's family.  LCSW contacted [facility] to obtain patient's Emergency Contact and updated address. Per [facility], patient's Emergency Contact is patient's daughter: Prudencio Burly [phone]) Patient's current address: [place], [place] [address]  LCSW provided information above to [facility]. RNCM to provide information to patient's RN.  Lubertha Sayres, [facility]: [phone] Surgical: [phone]

## 2015-10-28 NOTE — Progress Notes (Signed)
Initial Nutrition Assessment  DOCUMENTATION CODES:   Not applicable  INTERVENTION:   Once able to start nutrition recommend:  Nepro @ 35 ml/hr 30 ml Prostat TID Would provide: 1812 kcal, 113 grams protein, and 610 ml H2O.   NUTRITION DIAGNOSIS:   Increased nutrient needs related to  (sepsis, ESRD on HD) as evidenced by estimated needs.  GOAL:   Patient will meet greater than or equal to 90% of their needs  MONITOR:   I & O's, Vent status, Labs  REASON FOR ASSESSMENT:   Ventilator    ASSESSMENT:   Pt with hx of ESRD on HD T TH Sat on her last av fistula spot and presented with abd pain and fever. K+ was 6.8 was to have HD but coded and required CPR, epi, ca++, bicarb. Taken to ICU placed on normo therapy.    Concern for ischemic bowel, CT if able.   Patient is currently intubated on ventilator support MV: 13 L/min Temp (24hrs), Avg:96.8 F (36 C), Min:94.3 F (34.6 C), Max:100.5 F (38.1 C)  Nutrition-Focused physical exam completed. Findings are no fat depletion, no muscle depletion, and no edema.   Diet Order:  Diet NPO time specified  Skin:   (unknown)  Last BM:  unknown  Height:   Ht Readings from Last 1 Encounters:  11/12/2015 5\' 1"  (1.549 m)    Weight:   Wt Readings from Last 1 Encounters:  11/23/2015 156 lb 1.4 oz (70.8 kg)    Ideal Body Weight:  47.7 kg  BMI:  Body mass index is 29.51 kg/(m^2).  Estimated Nutritional Needs:   Kcal:  [id number]  Protein:  100-110 grams  Fluid:  1.2 L/day  EDUCATION NEEDS:   No education needs identified at this time  [place], [place], [place] Pager [phone] After Hours Pager

## 2015-10-28 NOTE — Progress Notes (Signed)
CRITICAL VALUE ALERT  Critical value received: lactic 21  Date of notification:  10/27/15  Time of notification: 2045  Critical value read back:yes  Nurse who received alert: Deloris Ping    MD notified (1st page):  E link  Time of first page: 2100  MD notified (2nd page):  Time of second page:  Responding MD: awaiting for oreder  Time MD responded:

## 2015-10-28 NOTE — Progress Notes (Signed)
   11/24/2015 1500  Clinical Encounter Type  Visited With Family;Patient and family together  Visit Type Critical Care;Initial;Spiritual support;Social support  Referral From Physician  Spiritual Encounters  Spiritual Needs Prayer;Emotional;Grief support  [facility] met with family and MD bedside for code status and medical update offering support, guidance and prayer for family; note that daughter of pt has some developmental issues, Elenor Legato is taking lead on care decisions.  Gwynn Burly 3:49 PM

## 2015-10-28 NOTE — Progress Notes (Signed)
CCM aware Trop 10.91

## 2015-10-28 NOTE — Progress Notes (Addendum)
I have had extensive discussions with family . We discussed patients current circumstances and organ failures. We also discussed patient's prior wishes under circumstances such as this. Family has decided to NOT perform resuscitation if arrest but to continue current medical support for now. Poor prgnosis  [name]. Titus Mould, MD, FACP Pgr: [facility]     K rise further, wanted to CT but K rising again and concern need clearance k  And unable to ct for now In am can cosnider CT if K resolving. CT may not even change outcome  . Even if surgical issue, doubt she is a candidate given status.  [name]. Titus Mould, MD, [place] Pgr: [facility]

## 2015-10-28 NOTE — Code Documentation (Signed)
Pulse returned per Claudine Mouton, MD. EKG and BP ordered verbally.

## 2015-10-28 NOTE — Code Documentation (Signed)
10 units humalog administered subcu by [place], Therapist, sports

## 2015-10-28 NOTE — Progress Notes (Signed)
Called Dr. Titus Mould about critical INR 5.61, orders received.

## 2015-10-29 LAB — PROTIME-INR
INR: 7.3 — AB (ref 0.00–1.49)
Prothrombin Time: 59.7 seconds — ABNORMAL HIGH (ref 11.6–15.2)

## 2015-10-29 LAB — BASIC METABOLIC PANEL
Anion gap: 32 — ABNORMAL HIGH (ref 5–15)
BUN: 13 mg/dL (ref 6–20)
CALCIUM: 7.1 mg/dL — AB (ref 8.9–10.3)
CO2: 15 mmol/L — ABNORMAL LOW (ref 22–32)
CREATININE: 3.47 mg/dL — AB (ref 0.44–1.00)
Chloride: 87 mmol/L — ABNORMAL LOW (ref 101–111)
GFR calc Af Amer: 18 mL/min — ABNORMAL LOW (ref >60)
GFR, EST NON AFRICAN AMERICAN: 15 mL/min — AB (ref >60)
Glucose, Bld: 130 mg/dL — ABNORMAL HIGH (ref 65–99)
POTASSIUM: 4.4 mmol/L (ref 3.5–5.1)
SODIUM: 134 mmol/L — AB (ref 135–145)

## 2015-10-29 LAB — BLOOD GAS, ARTERIAL
ACID-BASE DEFICIT: 7.6 mmol/L — AB (ref 0.0–2.0)
Bicarbonate: 17.3 mEq/L — ABNORMAL LOW (ref 20.0–24.0)
DRAWN BY: 405301
FIO2: 90
O2 Saturation: 98.8 %
PEEP: 8 cmH2O
PH ART: 7.335 — AB (ref 7.350–7.450)
Patient temperature: 97
RATE: 35 resp/min
TCO2: 18.4 mmol/L (ref 0–100)
VT: 380 mL
pCO2 arterial: 33 mmHg — ABNORMAL LOW (ref 35.0–45.0)
pO2, Arterial: 129 mmHg — ABNORMAL HIGH (ref 80.0–100.0)

## 2015-10-29 LAB — RENAL FUNCTION PANEL
ALBUMIN: 2.5 g/dL — AB (ref 3.5–5.0)
Anion gap: 32 — ABNORMAL HIGH (ref 5–15)
BUN: 9 mg/dL (ref 6–20)
CO2: 16 mmol/L — ABNORMAL LOW (ref 22–32)
CREATININE: 3.14 mg/dL — AB (ref 0.44–1.00)
Calcium: 7.1 mg/dL — ABNORMAL LOW (ref 8.9–10.3)
Chloride: 85 mmol/L — ABNORMAL LOW (ref 101–111)
GFR calc Af Amer: 20 mL/min — ABNORMAL LOW (ref >60)
GFR, EST NON AFRICAN AMERICAN: 17 mL/min — AB (ref >60)
Glucose, Bld: 124 mg/dL — ABNORMAL HIGH (ref 65–99)
PHOSPHORUS: 4.9 mg/dL — AB (ref 2.5–4.6)
Potassium: 4.1 mmol/L (ref 3.5–5.1)
SODIUM: 133 mmol/L — AB (ref 135–145)

## 2015-10-29 LAB — PREPARE FRESH FROZEN PLASMA
UNIT DIVISION: 0
UNIT DIVISION: 0
UNIT DIVISION: 0
Unit division: 0

## 2015-10-29 LAB — HEMOGLOBIN A1C
HEMOGLOBIN A1C: 11 % — AB (ref 4.8–5.6)
MEAN PLASMA GLUCOSE: 269 mg/dL

## 2015-10-29 LAB — GLUCOSE, CAPILLARY
GLUCOSE-CAPILLARY: 115 mg/dL — AB (ref 65–99)
GLUCOSE-CAPILLARY: 133 mg/dL — AB (ref 65–99)
Glucose-Capillary: 125 mg/dL — ABNORMAL HIGH (ref 65–99)

## 2015-10-29 LAB — TROPONIN I

## 2015-10-29 LAB — CBC
HCT: 27.8 % — ABNORMAL LOW (ref 36.0–46.0)
HEMOGLOBIN: 8 g/dL — AB (ref 12.0–15.0)
MCH: 27.6 pg (ref 26.0–34.0)
MCHC: 28.8 g/dL — ABNORMAL LOW (ref 30.0–36.0)
MCV: 95.9 fL (ref 78.0–100.0)
Platelets: 28 10*3/uL — CL (ref 150–400)
RBC: 2.9 MIL/uL — AB (ref 3.87–5.11)
RDW: 19.6 % — ABNORMAL HIGH (ref 11.5–15.5)
WBC: 7.5 10*3/uL (ref 4.0–10.5)

## 2015-10-29 LAB — HEPATITIS B SURFACE ANTIGEN: HEP B S AG: NEGATIVE

## 2015-10-29 LAB — MAGNESIUM: Magnesium: 1.9 mg/dL (ref 1.7–2.4)

## 2015-10-29 LAB — APTT: APTT: 88 s — AB (ref 24–37)

## 2015-10-29 LAB — PROCALCITONIN: Procalcitonin: 61.21 ng/mL

## 2015-10-29 MED ORDER — SODIUM CHLORIDE 0.9 % IV SOLN
10.0000 mg/h | INTRAVENOUS | Status: DC
  Administered 2015-10-29: 10 mg/h via INTRAVENOUS
  Filled 2015-10-29: qty 10

## 2015-10-29 MED ORDER — MIDAZOLAM HCL 2 MG/2ML IJ SOLN: 2.0000 mg | INTRAMUSCULAR | Status: DC | PRN

## 2015-10-29 MED ORDER — SODIUM CHLORIDE 0.9 % IV SOLN
Freq: Once | INTRAVENOUS | Status: AC
  Administered 2015-10-29: 06:00:00 via INTRAVENOUS

## 2015-10-29 MED ORDER — STERILE WATER FOR INJECTION IV SOLN
INTRAVENOUS | Status: DC
  Administered 2015-10-29 (×2): via INTRAVENOUS_CENTRAL
  Filled 2015-10-29 (×11): qty 150

## 2015-10-29 MED ORDER — FENTANYL BOLUS VIA INFUSION
50.0000 ug | INTRAVENOUS | Status: DC | PRN
  Filled 2015-10-29: qty 200

## 2015-10-29 MED ORDER — FENTANYL CITRATE (PF) 100 MCG/2ML IJ SOLN: 50.0000 ug | INTRAMUSCULAR | Status: DC | PRN

## 2015-10-29 MED ORDER — MIDAZOLAM BOLUS VIA INFUSION (WITHDRAWAL LIFE SUSTAINING TX)
5.0000 mg | INTRAVENOUS | Status: DC | PRN
  Filled 2015-10-29: qty 20

## 2015-10-29 MED ORDER — SODIUM CHLORIDE 0.9 % IV SOLN
100.0000 ug/h | INTRAVENOUS | Status: DC
  Administered 2015-10-29: 100 ug/h via INTRAVENOUS
  Filled 2015-10-29: qty 50

## 2015-10-30 LAB — PREPARE PLATELET PHERESIS: Unit division: 0

## 2015-10-31 LAB — CULTURE, RESPIRATORY W GRAM STAIN

## 2015-10-31 LAB — CULTURE, RESPIRATORY

## 2015-10-31 MED FILL — Medication: Qty: 1 | Status: AC

## 2015-11-02 LAB — CULTURE, BLOOD (ROUTINE X 2)
Culture: NO GROWTH
Culture: NO GROWTH

## 2015-11-08 ENCOUNTER — Telehealth: Payer: Self-pay

## 2015-11-08 NOTE — Telephone Encounter (Signed)
On 11/08/2015 I received a death certificate from [facility] (Original). The death certificate is for cremation. The patient is a patient of Doctor Sood. The death certificate will be taken to Pulmonary Unit at [facility] for signature. On 11/22/2015 I received the death certificate back from [facility] [place]. I got the death certificate ready and called the funeral home to let them know the death certificate is ready for pickup.

## 2015-11-23 ENCOUNTER — Encounter (INDEPENDENT_AMBULATORY_CARE_PROVIDER_SITE_OTHER): Payer: Medicare Other | Admitting: Ophthalmology

## 2015-11-26 NOTE — Discharge Summary (Signed)
  [name] was a 45 y.o. female admitted on 11/18/2015 with nausea, vomiting, fever 101F and fall.  She came to ER.  She developed cardiac arrest in ER.  She had hyperkalemia and acidosis.  She had prolonged course before ROSC.  She was started on normothermia protocol.  Nephrology was consulted and she was started on CRRT.  She was started on Abx and pressors.  Family updated about poor prognosis >> made DNR.  She did not show any signs of improvement, and family transitioned to comfort measures.  She was extubated, and subsequently expired on 11/17/2015 at 1501.  Final Diagnoses: Asystolic cardiac arrest Hyperkalemia Metabolic acidosis Lactic acidosis Stage V CKD/ESRD  Acute hypoxic respiratory failure Anoxic encephalopathy Septic shock NSTEMI Cardiogenic shock Anemia of critical illness and chronic disease Thrombocytopenia Coagulopathy Hyperglycemia Diabetes mellitus type I with hx of retinopathy Hx of chronic diastolic CHF   [name], MD [place] [facility] 11/02/2015, 9:07 AM

## 2015-11-26 NOTE — Progress Notes (Signed)
Family indicated that they were ready to begin terminal extubation.  Fentanyl and Versed drips started per orders and respiratory therapist notified.

## 2015-11-26 NOTE — Progress Notes (Signed)
Interdisciplinary Goals of Care Family Meeting    Date carried out:: 11-15-15  Location of the meeting: Conference room  Member's involved: Physician, Bedside Registered Nurse and Family Member or next of kin  Durable Power of Attorney or acting medical decision maker: Fred[place] (aunt), Uncle, daugher, sisters. There was no designated POA  Discussion: We discussed goals of care for [employer] .  I explained to the family she she is in multiorgan failure with signs of anoxic injury. The chances of her recovery are nil. They are very tearful but most were accepting of the situation. They do not want to prolong her suffering and are OK with transitioning to comfort care. Her daughter is not ready yet but the rest of the family plan to console her and talk to her in private. They will let us know when they are ready to withdraw care. This will be likely later today.    Code status: Limited Code or DNR with short term  Disposition: In-patient comfort care  Time spent for the meeting: 45 mins  Danne Vasek Nov 15, 2015 12:34 PM

## 2015-11-26 NOTE — Progress Notes (Signed)
Unable to hear any bowel sounds-Elink made aware-no output noted in the OGT.

## 2015-11-26 NOTE — Progress Notes (Signed)
CRITICAL VALUE ALERT  Critical value received: plt [id number] Date of notification: 11-10-15  Time of notification: 0455  Critical value read back:yes Nurse who received alert: Deloris Ping   MD notified (1st page):  Dr Jenell Milliner  Time of first page: 0500  MD notified (2nd page):  Time of second page:  Responding MD: Dr Jenell Milliner  Time MD responded:

## 2015-11-26 NOTE — Progress Notes (Signed)
PCCM PROGRESS NOTE  [name] is a 45 y.o. female admitted on 11/02/2015 nausea, vomiting, fever 101F and fall.  She came to ER.  She developed cardiac arrest in ER.  She had hyperkalemia and acidosis.  She had prolonged course before ROSC.  She was started on normothermia protocol.  Nephrology was consulted and she was started on CRRT.  She was started on Abx and pressors.  Family updated about poor prognosis >> made DNR.  Subjective: Remains on pressors, CRRT, full vent support.  Vital signs: BP 148/62 mmHg  Pulse 105  Temp(Src) 96.8 F (36 C) (Core (Comment))  Resp 35  Ht 5\' 1"  (1.549 m)  Wt 176 lb 5.9 oz (80 kg)  BMI 33.34 kg/m2  SpO2 97%  LMP 09/30/2015  Intake/output: I/O last 3 completed shifts: In: 6695.6 [I.V.:4541.6; Blood:1052; IV Piggyback:1102] Out: [id number] [Emesis/NG output:35; Other:1382]  General: comatose Neuro: no gag HEENT: pupils dilated Cardiac: regular, tachycardic Chest: b/l crackles Abd: distended, no bowel sounds Ext: cool Skin: no rashes   CBC Recent Labs     11/11/2015  0511   11/16/2015  0957  11/12/2015  1819  11/25/15  0230  WBC  12.4*   --    --    --   7.5  HGB  9.5*   < >  12.6  11.2*  8.0*  HCT  31.9*   < >  37.0  33.0*  27.8*  PLT  84*   --    --    --   28*   < > = values in this interval not displayed.    Coag's Recent Labs     10/27/2015  0511  11/09/2015  0825  11/12/2015  1400  11/25/15  0230  APTT   --   74*  111*  88*  INR  2.17*   --   5.71*  7.30*    BMET Recent Labs     11/18/2015  1945  25-Nov-2015  0002  11/25/2015  0230  NA  135  134*  133*  K  5.1  4.4  4.1  CL  88*  87*  85*  CO2  12*  15*  16*  BUN  15  13  9   CREATININE  3.89*  3.47*  3.14*  GLUCOSE  140*  130*  124*    Electrolytes Recent Labs     11/19/2015  1945  11-25-2015  0002  November 25, 2015  0230  CALCIUM  7.4*  7.1*  7.1*  MG   --    --   1.9  PHOS   --    --   4.9*    Sepsis Markers Recent Labs     11/09/2015  1000  11-25-15  0230  PROCALCITON   102.68  61.21    ABG Recent Labs     11/15/2015  1623  11/03/2015  2218  11-25-2015  0400  PHART  7.207*  7.256*  7.335*  PCO2ART  29.7*  32.4*  33.0*  PO2ART  122.0*  123*  129*    Liver Enzymes Recent Labs     10/31/2015  0511  25-Nov-2015  0230  AST  107*   --   ALT  30   --   ALKPHOS  192*   --   BILITOT  4.8*   --   ALBUMIN  3.0*  2.5*    Cardiac Enzymes Recent Labs     10/31/2015  1400  11/17/2015  1945  2015-11-14  0230  TROPONINI  21.52*  35.55*  >65.00*    Glucose Recent Labs     11/06/2015  1216  11/19/2015  1638  11/08/2015  1944  11/14/2015  0006  11/14/15  0354  11/14/15  0741  GLUCAP  169*  144*  148*  133*  125*  115*    Imaging Dg Chest Port 1 View  11/15/2015  CLINICAL DATA:  Central line placement EXAM: PORTABLE CHEST 1 VIEW COMPARISON:  11/09/2015 at 0656 hours FINDINGS: Endotracheal tube with tip 11 mm above the carina. NG tube crosses the gastroesophageal junction. No definite central line identified. Numerous devices project over the patient's and lower cervical region on the right. Persistent but improved right upper lobe consolidation. IMPRESSION: Central line not definitively identified. Correlate clinically and tailor subsequent radiographs to examine the anticipated area where central line should be expected. Electronically Signed   By: Skipper Cliche M.D.   On: 10/31/2015 10:50   Dg Chest Portable 1 View  11/16/2015  CLINICAL DATA:  Status post CPR with endotracheal tube placement EXAM: PORTABLE CHEST 1 VIEW COMPARISON:  04/04/2015 FINDINGS: Endotracheal tube tip about 2.5 cm above the carina. NG tube crosses the gastroesophageal junction. Right upper lobe is diffusely consolidated, new from the prior study. Lungs otherwise clear. Stable mild cardiac enlargement. IMPRESSION: Support devices as described. New diffuse right upper lobe consolidation. Electronically Signed   By: Skipper Cliche M.D.   On: 11/05/2015 07:09   Dg Abd Portable 1v  11/24/2015  CLINICAL  DATA:  Nasogastric tube placement EXAM: PORTABLE ABDOMEN - 1 VIEW COMPARISON:  None. FINDINGS: Nasogastric tube crosses the gastroesophageal junction and follows the course that suggests its tip is within the body of the stomach along the greater curvature. IMPRESSION: NG tube as described Electronically Signed   By: Skipper Cliche M.D.   On: 11/07/2015 10:46    Assessment: Asystolic cardiac arrest Hyperkalemia Metabolic acidosis Lactic acidosis Stage V CKD/ESRD  Acute hypoxic respiratory failure Anoxic encephalopathy Septic shock NSTEMI Cardiogenic shock Anemia of critical illness and chronic disease Thrombocytopenia Coagulopathy Hyperglycemia Diabetes mellitus type I with hx of retinopathy Hx of chronic diastolic CHF  Plan: Continue current therapies for now Would not escalate care Will need to discuss with family goals of care >> she will not be able to recover from this event, and transition to comfort measures likely best option  [name], MD [place] 11/14/15, 8:50 AM Pager:  [phone] After 3pm call: [phone]

## 2015-11-26 NOTE — Progress Notes (Signed)
eLink Physician-Brief Progress Note Patient Name: [name] DOB: 04/18/1971 MRN: [id number]   Date of Service  2015-10-31  HPI/Events of Note  Patient with multiorgan failure and severe coagulopathy, INR now 7. very poor chance of meaningful recovery  eICU Interventions  Would NOT recommend any more blood products        Elizandro Laura 10/31/2015, 6:30 AM

## 2015-11-26 NOTE — Progress Notes (Signed)
  [facility] Progress Note   Subjective: remains unresponsive  Filed Vitals:   11/01/2015 1101 11/01/15 1200 01-Nov-2015 1300 Nov 01, 2015 1400  BP:      Pulse: 100 97    Temp:  97.9 F (36.6 C)    TempSrc:  Core (Comment)    Resp: 35 20 10 0  Height:      Weight:      SpO2: 97% 97%      Inpatient medications: . anidulafungin  100 mg Intravenous Q24H  . antiseptic oral rinse  7 mL Mouth Rinse QID  . chlorhexidine gluconate (SAGE KIT)  15 mL Mouth Rinse BID  . insulin aspart  0-9 Units Subcutaneous Q4H  . meropenem (MERREM) IV  1 g Intravenous Q12H  . pantoprazole sodium  40 mg Per Tube Daily  . vancomycin  750 mg Intravenous Q24H   . sodium chloride Stopped (November 01, 2015 1442)  . fentaNYL infusion INTRAVENOUS 100 mcg/hr (11/01/15 1422)  . midazolam (VERSED) infusion 10 mg/hr (01-Nov-2015 1424)  . norepinephrine (LEVOPHED) Adult infusion 40 mcg/min (11/01/2015 1126)  . phenylephrine (NEO-SYNEPHRINE) Adult infusion 250 mcg/min (11/01/15 1127)  . dialysis replacement fluid (prismasate) Stopped (Nov 01, 2015 1400)  . dialysate (PRISMASATE) Stopped (11-01-15 1400)  . sodium bicarbonate (isotonic) 1000 mL infusion Stopped (11/01/15 1400)   alteplase, atropine, fentaNYL, fentaNYL (SUBLIMAZE) injection, heparin, midazolam, midazolam, sodium chloride  Exam: Intubated, unresopnsvie Neg doll's eyes No JVD Chest clear ant/ lat RRR no mrg Abd has cooling apparatus in place Ext "   "   " R leg AVG +bruit Neuro not responding, gaze fixed  Dialysis: MWF [facility]      Assessment: 1  Cardiac arrest 2  AMS prob hypoxic/ ischemic injury 3  ESRD on CRRT for now 4  Shock/ lactic acidosis - likely septic 5  ^^troponin post arrest 6  DNR  Plan - cont CRRT for now, family is considering options including possible withdrawal of care soon   Kelly Splinter MD [facility] pager [phone]    cell [phone] 11-01-15, 2:42 PM    Recent Labs Lab 11/14/2015 1945 2015-11-01 0002  11/01/2015 0230  NA 135 134* 133*  K 5.1 4.4 4.1  CL 88* 87* 85*  CO2 12* 15* 16*  GLUCOSE 140* 130* 124*  BUN _0 CREATININE 3.89* 3.47* 3.14*  CALCIUM 7.4* 7.1* 7.1*  PHOS  --   --  4.9*    Recent Labs Lab 11/22/2015 0511 11-01-15 0230  AST 107*  --   ALT 30  --   ALKPHOS 192*  --   BILITOT 4.8*  --   PROT 7.4  --   ALBUMIN 3.0* 2.5*    Recent Labs Lab 11/22/2015 0511  11/09/2015 0957 11/16/2015 1819 2015-11-01 0230  WBC 12.4*  --   --   --  7.5  NEUTROABS 11.0*  --   --   --   --   HGB 9.5*  < > 12.6 11.2* 8.0*  HCT 31.9*  < > 37.0 33.0* 27.8*  MCV 93.8  --   --   --  95.9  PLT 84*  --   --   --  28*  < > = values in this interval not displayed. Iron/TIBC/Ferritin/ %Sat    Component Value Date/Time   IRON 47 01/21/2014 2031   TIBC 194* 01/21/2014 2031   FERRITIN 930* 01/21/2014 2031   IRONPCTSAT 24 01/21/2014 2031

## 2015-11-26 NOTE — Progress Notes (Signed)
25 cc versed and 225 cc fentanyl wasted in sink.  Witnessed by Lesli Albee, RN

## 2015-11-26 NOTE — Progress Notes (Signed)
Pt time of death 1501.  Asystole.  No heart or breath sounds.  Verified by 2 RNs, Bobbe Medico and Lesli Albee.  Family at bedside and aware.  Warren Lacy MD notified.

## 2015-11-26 NOTE — Progress Notes (Deleted)
  [name] was a 45 y.o. female admitted on 11/10/2015 with nausea, vomiting, fever 101F and fall.  She came to ER.  She developed cardiac arrest in ER.  She had hyperkalemia and acidosis.  She had prolonged course before ROSC.  She was started on normothermia protocol.  Nephrology was consulted and she was started on CRRT.  She was started on Abx and pressors.  Family updated about poor prognosis >> made DNR.  She did not show any signs of improvement, and family transitioned to comfort measures.  She was extubated, and subsequently expired on 11-06-2015 at 1501.  Final Diagnoses: Asystolic cardiac arrest Hyperkalemia Metabolic acidosis Lactic acidosis Stage V CKD/ESRD  Acute hypoxic respiratory failure Anoxic encephalopathy Septic shock NSTEMI Cardiogenic shock Anemia of critical illness and chronic disease Thrombocytopenia Coagulopathy Hyperglycemia Diabetes mellitus type I with hx of retinopathy Hx of chronic diastolic CHF   [name], MD [place] [facility] 11/02/2015, 9:05 AM

## 2015-11-26 NOTE — Procedures (Signed)
Extubation Procedure Note  Patient Details:   Name: [name] DOB: 12-09-1970 MRN: [id number]   Airway Documentation:   Prior to extubation, procedure explained to family and all questions answered.    Evaluation  O2 sats: pt extubated for withdrawal of life support protocol, and family request Complications: No apparent complications Patient did tolerate procedure well. Bilateral Breath Sounds: Diminished, Clear   No  Lenna Sciara 17-Nov-2015, 2:57 PM

## 2015-11-26 NOTE — Progress Notes (Signed)
CRITICAL VALUE ALERT  Critical value received:  INR 7.3  Date of notification: 11/28/2015  Time of notification:  0630  Critical value read back:yes  Nurse who received alert:  Deloris Ping   MD notified (1st page): Dr Mortimer Fries Time of first page: [phone]  MD notified (2nd page):  Time of second page:  Responding MD: Dr Mortimer Fries  Time MD responded:

## 2015-11-26 DEATH — deceased

## 2015-12-15 IMAGING — CR DG CHEST 1V PORT
1 series · 1 of 1 positions shown · non-contrast
Comparison: Chest radiograph November 23, 2013 at 6264 hr and chest
radiograph June 27, 2013

CLINICAL DATA: Repositioned endotracheal tube.

EXAM:
PORTABLE CHEST - 1 VIEW

[AP]
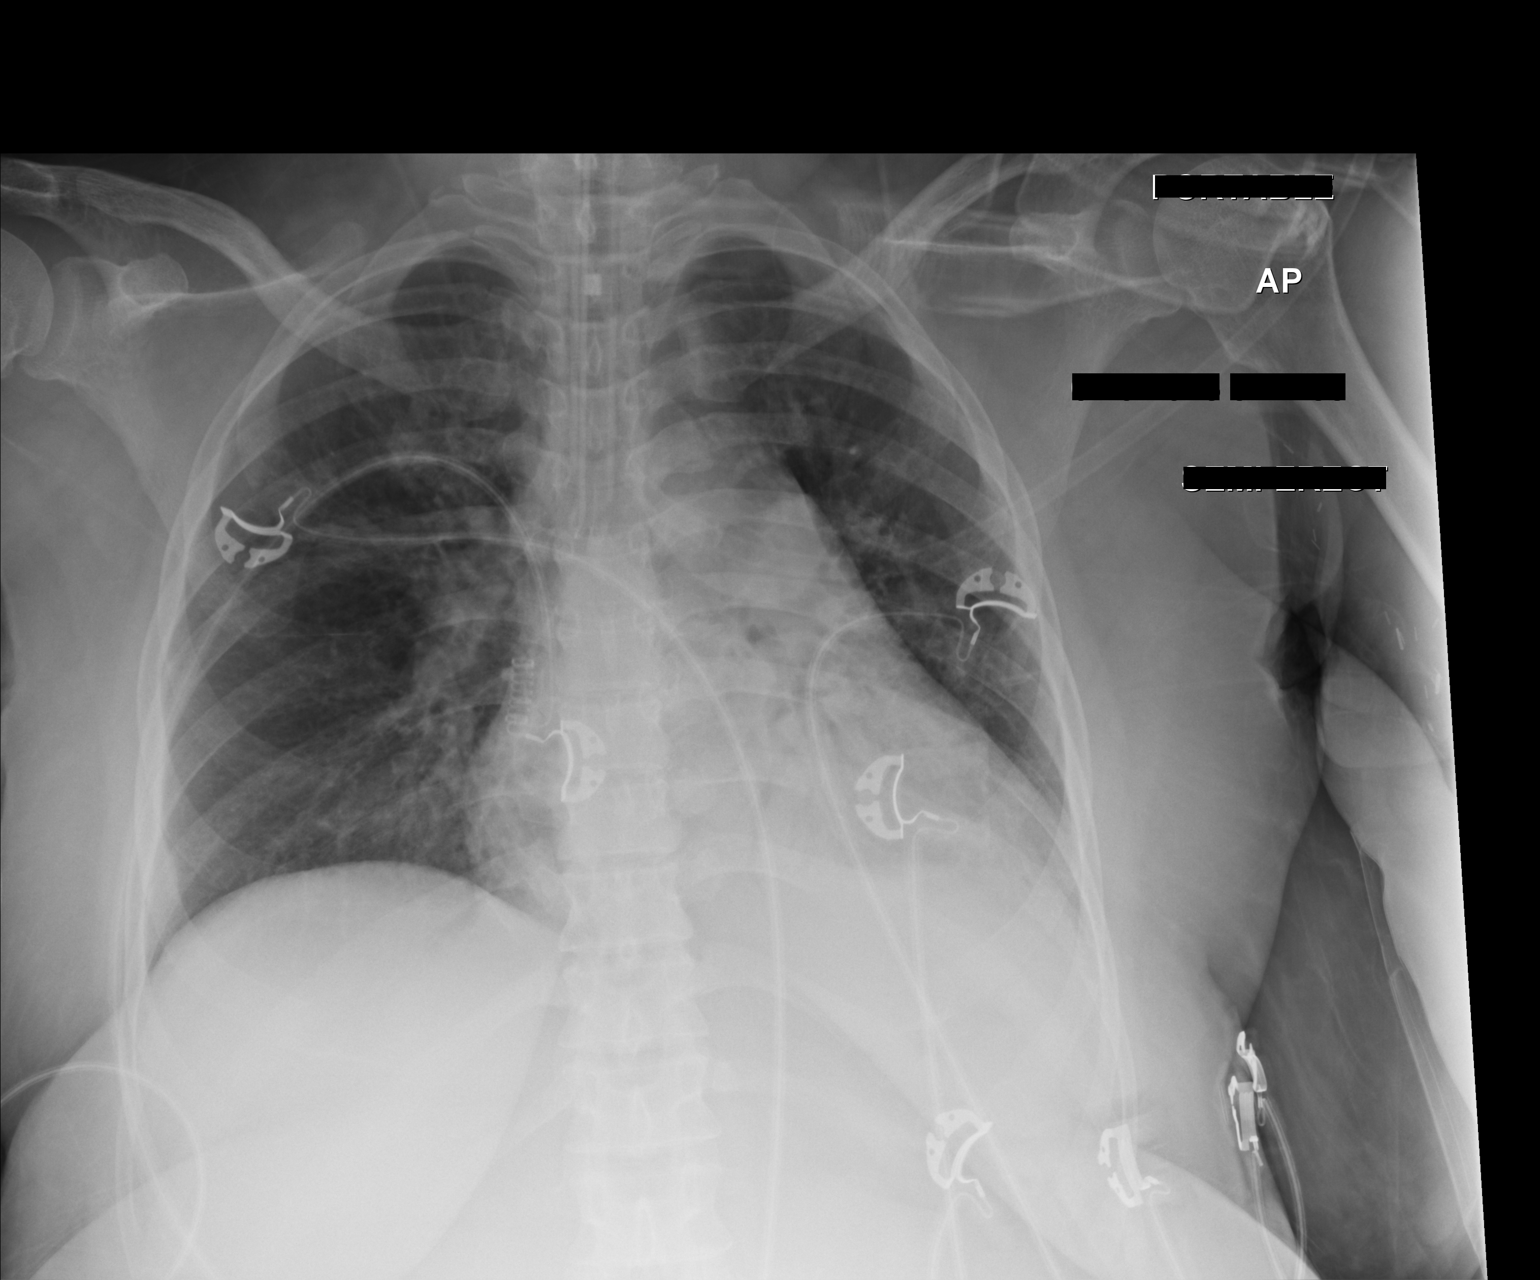

[1 of 1 positions shown; findings below may reference images not displayed]

FINDINGS: Retracted endotracheal tube, distal tip at the carina. Re-expanded
left lung with patchy airspace opacities. No pleural effusions. No
pneumothorax

Cardiac silhouette appears mildly enlarged, mediastinal silhouette
is nonsuspicious. Multiple EKG lines overlie the patient and may
obscure subtle underlying pathology. Soft tissue planes and included
osseous structures are unchanged.
IMPRESSION: Retracted endotracheal tube, distal tip at the carina, recommend 2
cm of further retraction.

Re-expanded left lung with patchy airspace opacities, nonspecific.

Mild cardiomegaly.

  By: Apollonas Uchana

## 2015-12-15 IMAGING — CR DG ABD PORTABLE 1V
1 series · 1 of 1 positions shown · non-contrast
Comparison: None.

CLINICAL DATA: Central line placement, emesis.

EXAM:
PORTABLE ABDOMEN - 1 VIEW

[AP]
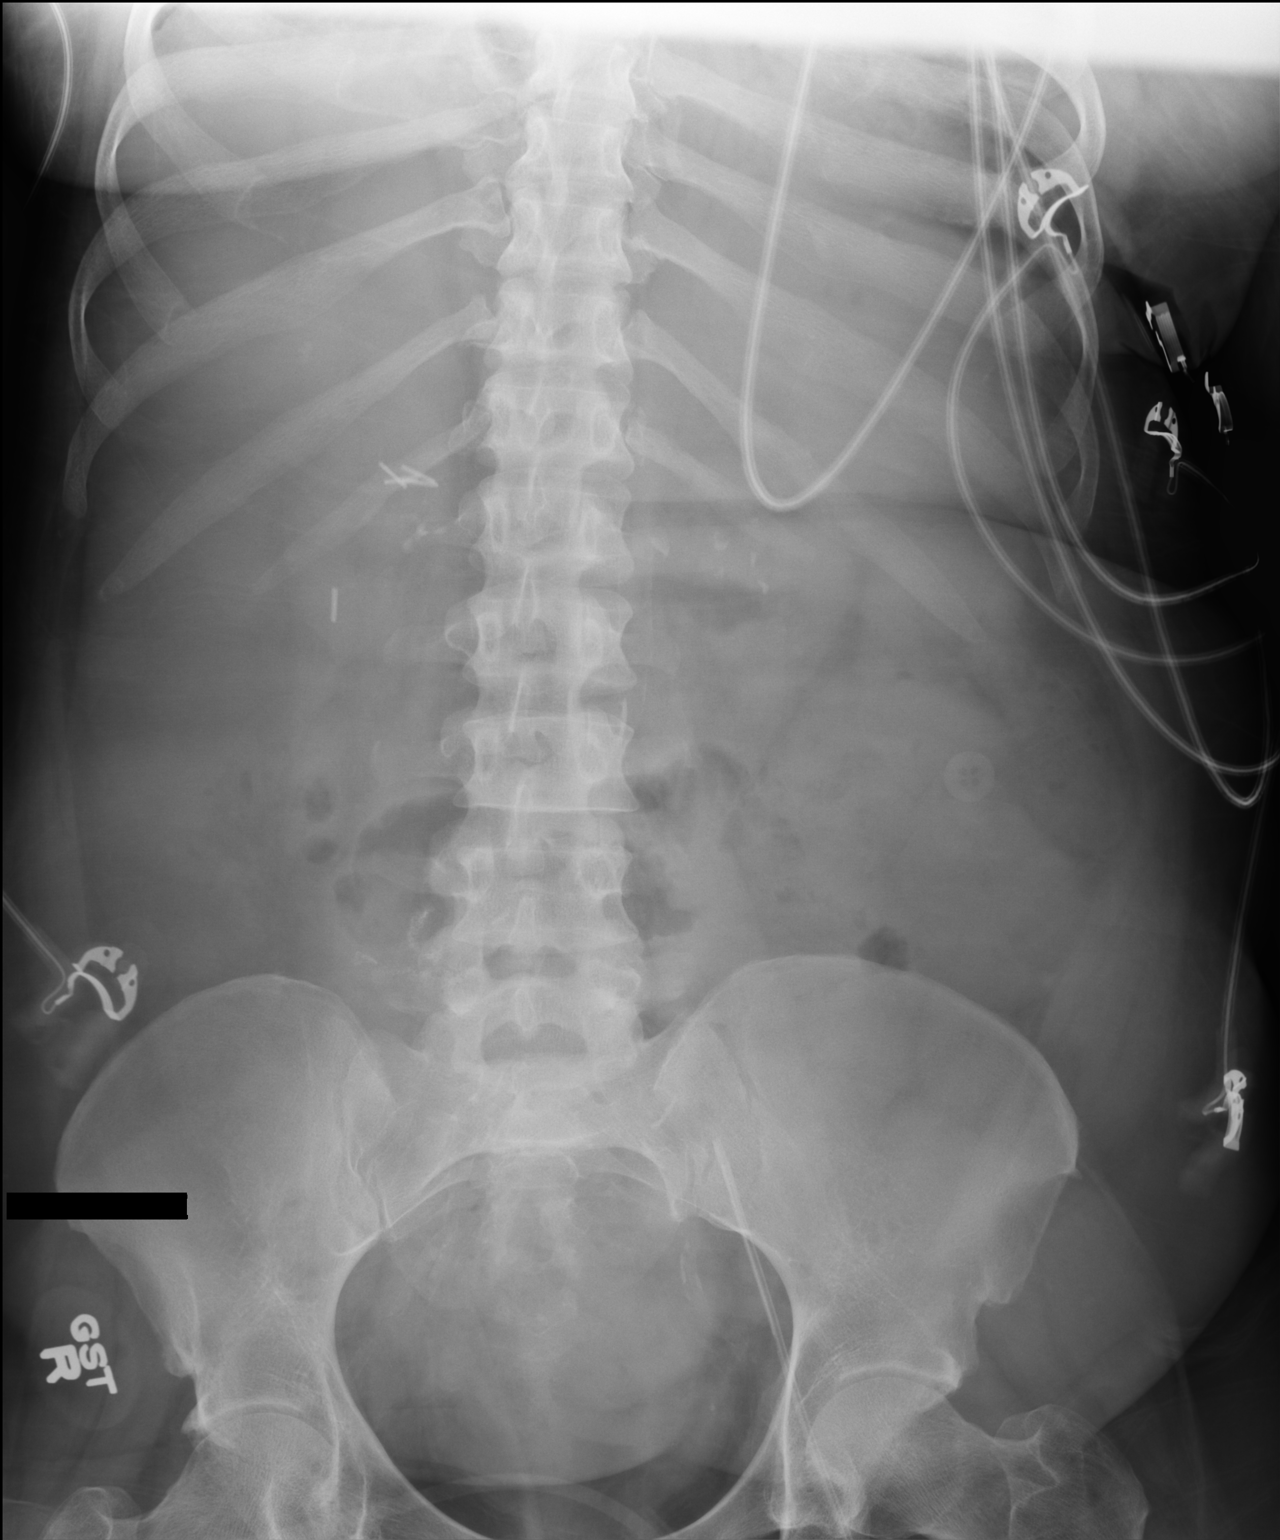

[1 of 1 positions shown; findings below may reference images not displayed]

FINDINGS: Left femoral line in place, distal tip projecting over the sacrum.

Bowel gas pattern is nondilated and nonobstructive, with overall
paucity of bowel gas. A staple line in the in the right lower
quadrant, surgical clips in the right upper quadrant, subcentimeter
calcifications in the the left abdomen may are present
nephrolithiasis. Soft tissue planes and included osseous structures
are nonsuspicious.

:
Left femoral line distal tip projects at the sacrum.

Nonspecific bowel gas pattern.

  By: Lump Kerai

## 2015-12-15 IMAGING — CR DG CHEST 1V PORT
1 series · 1 of 1 positions shown · non-contrast
Comparison: 06/27/2013

CLINICAL DATA: Emesis

EXAM:
PORTABLE CHEST - 1 VIEW

[AP]
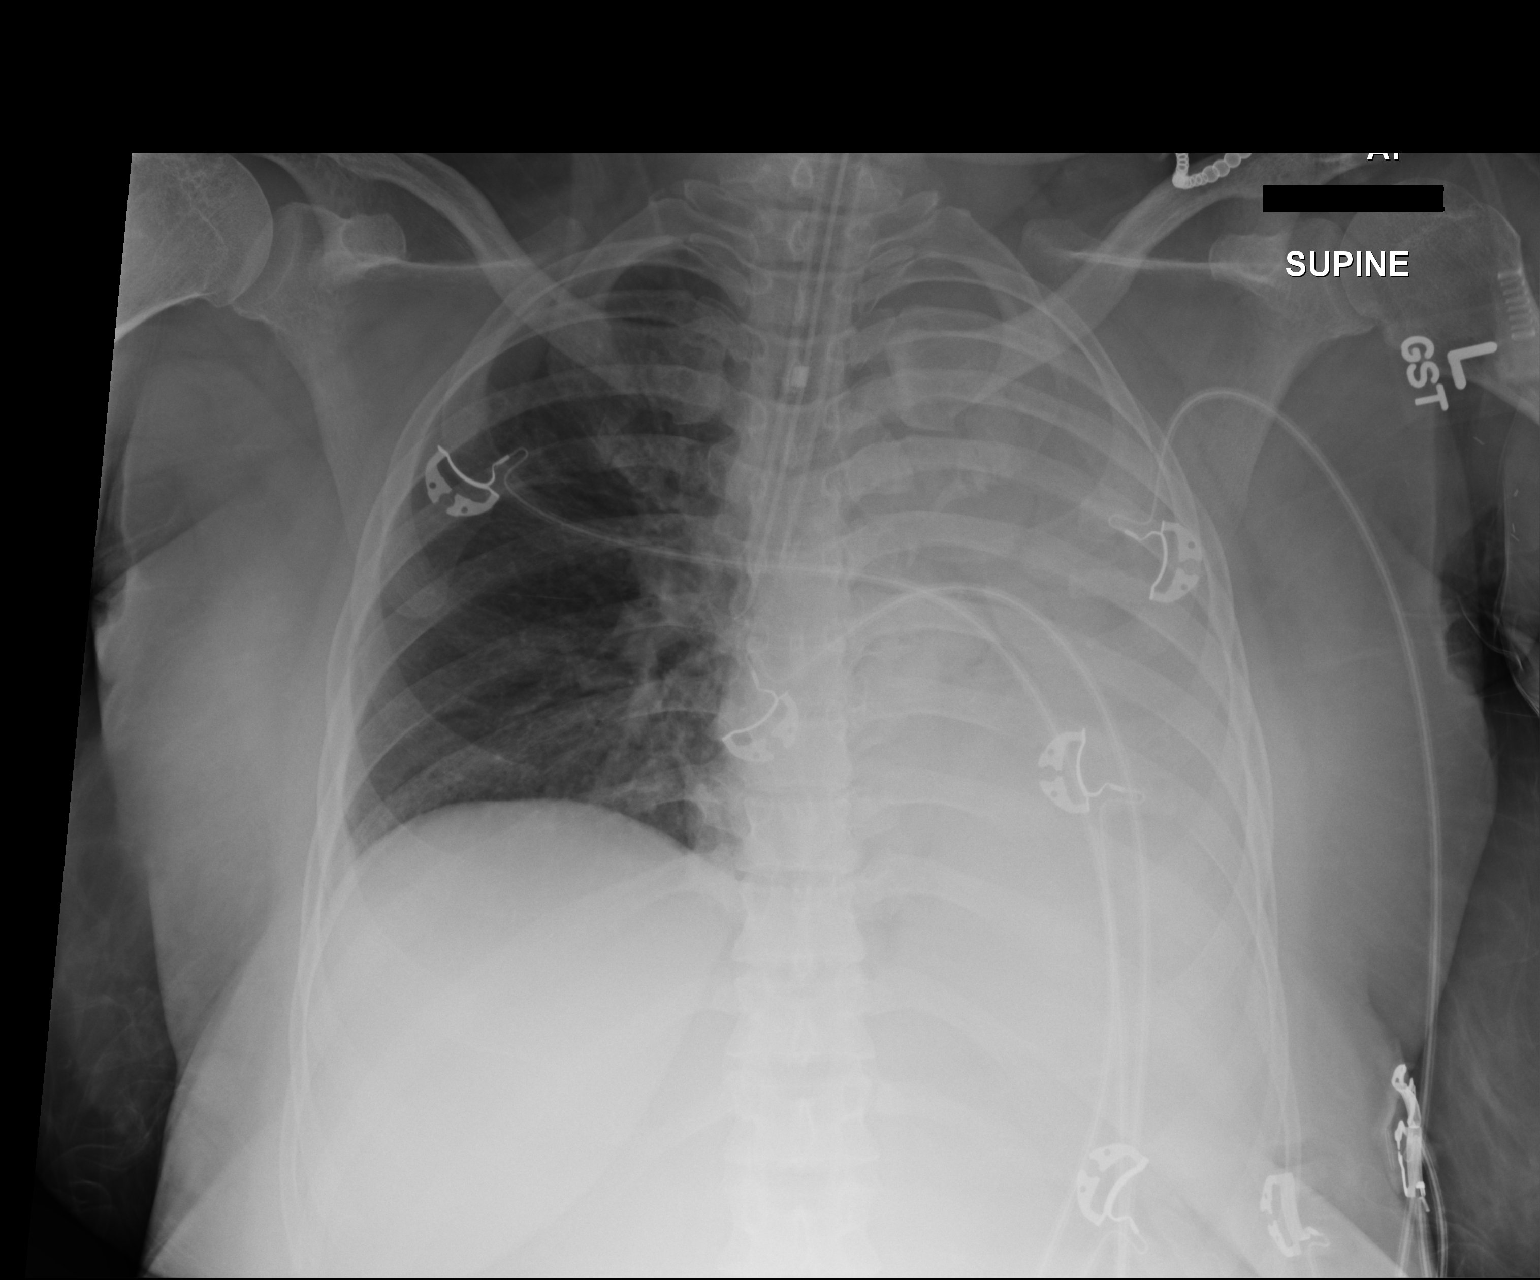

[1 of 1 positions shown; findings below may reference images not displayed]

FINDINGS: Right mainstem intubation. The tube would need to be retracted 5 cm
to reach the mid thoracic trachea. There is complete collapse of the
left lung, with leftward shift of the mediastinum. The tube has been
retracted at time of dictation. The heart size is obscured. The
right lung is well aerated. No pneumothorax.
IMPRESSION: Right mainstem intubation and left lung collapse.

## 2015-12-18 IMAGING — CT CT ABD-PELV W/ CM
2 of 5 series · 16 of 46 positions shown, 18 images · IV contrast (Omni 300)
Comparison: CT 03/16/2013.

CLINICAL DATA: Generalized abdominal pain with nausea, vomiting and
diarrhea. History of diabetes and end-stage renal disease on
dialysis.

EXAM:
CT ABDOMEN AND PELVIS WITH CONTRAST
TECHNIQUE: Multidetector CT imaging of the abdomen and pelvis was performed
using the standard protocol following bolus administration of
intravenous contrast.
CONTRAST:  80mL OMNIPAQUE IOHEXOL 300 MG/ML  SOLN

[Series 2: abd/ pelvis 5.0 i30f 1 · axial · 0.68mm/px · z∈[+938,+1332]mm · 13 of 89 slices shown, 15 images]
[im 5/89  soft-tissue]
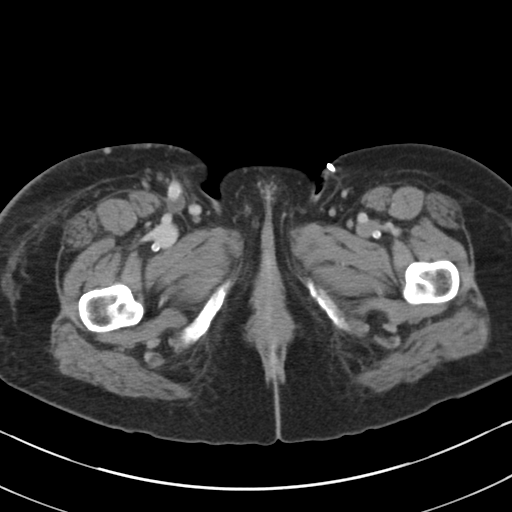
[im 5/89  bone]
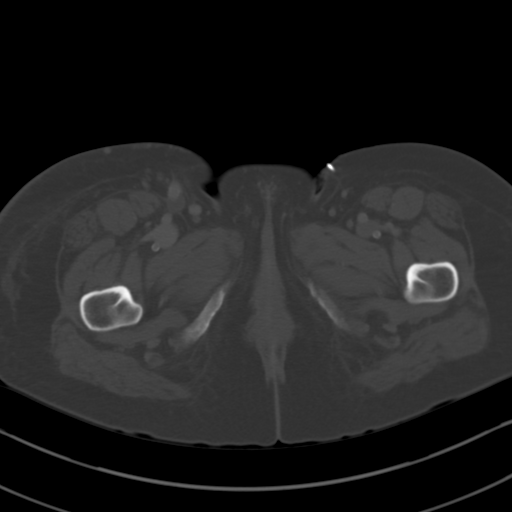
[im 14/89  soft-tissue]
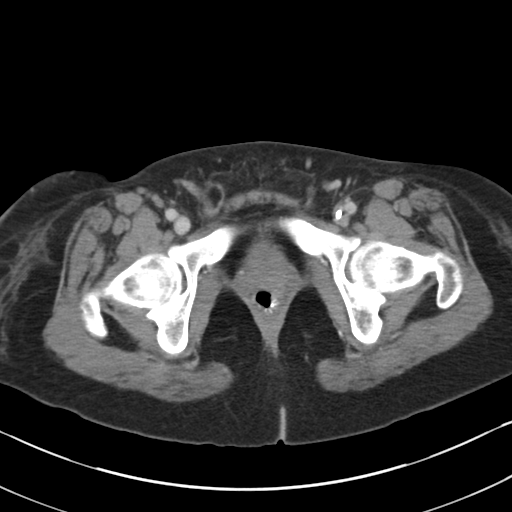
[im 19/89  soft-tissue]
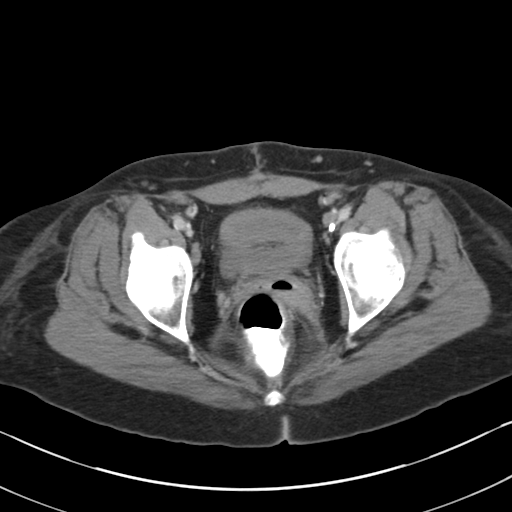
[im 24/89  soft-tissue]
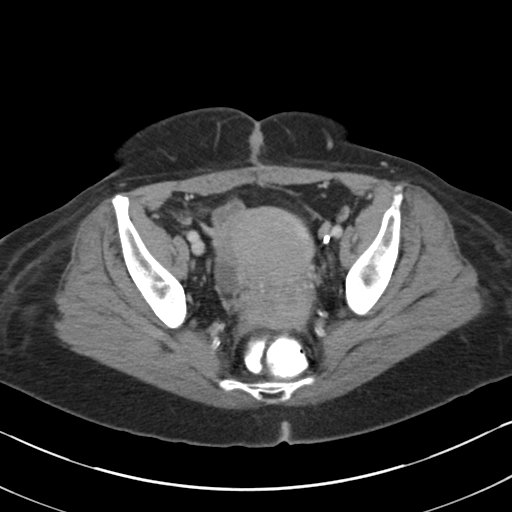
[im 33/89  soft-tissue]
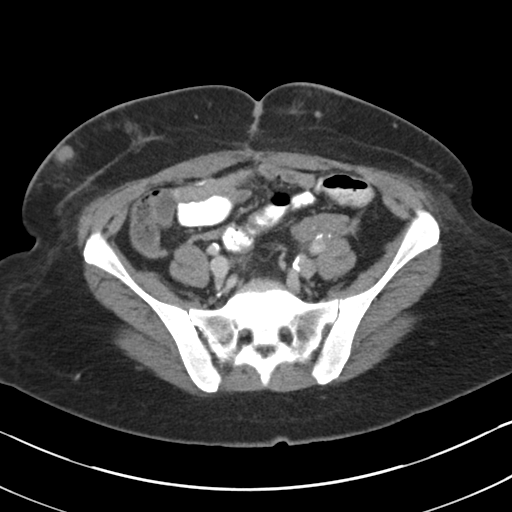
[im 38/89  soft-tissue]
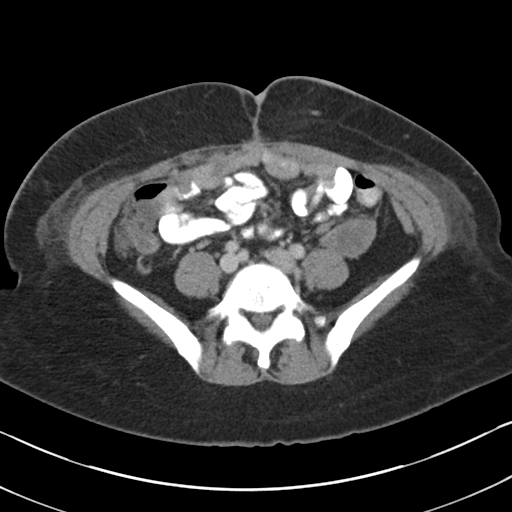
[im 47/89  soft-tissue]
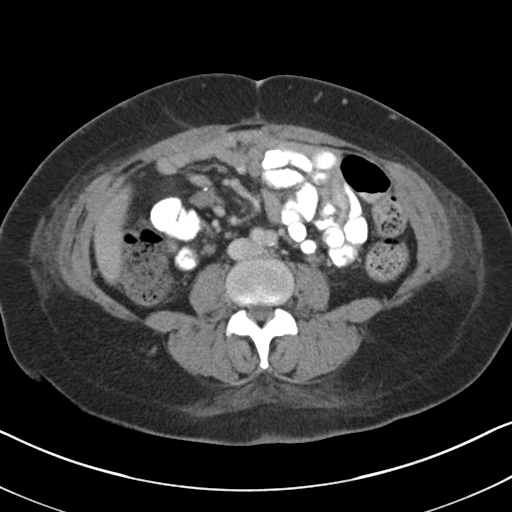
[im 51/89  soft-tissue]
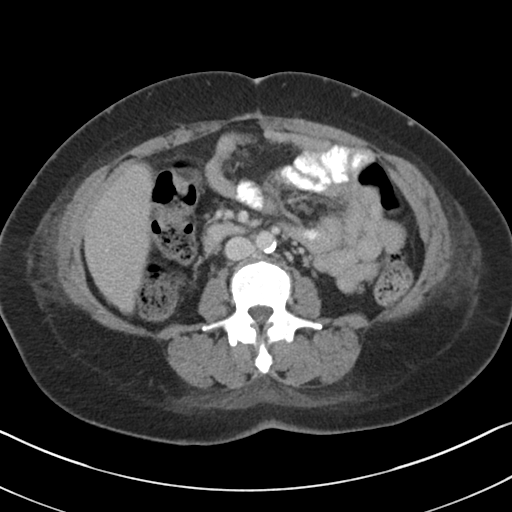
[im 56/89  soft-tissue]
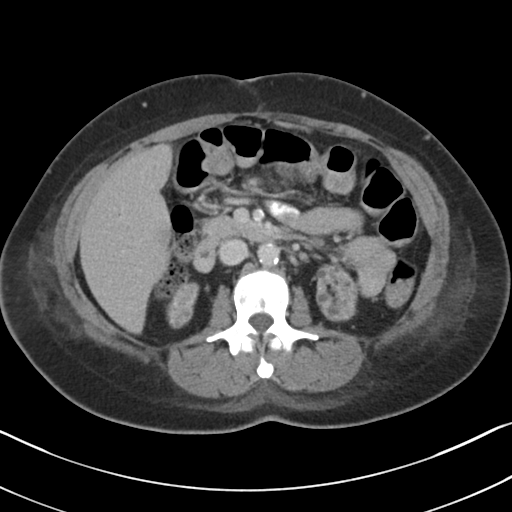
[im 56/89  bone]
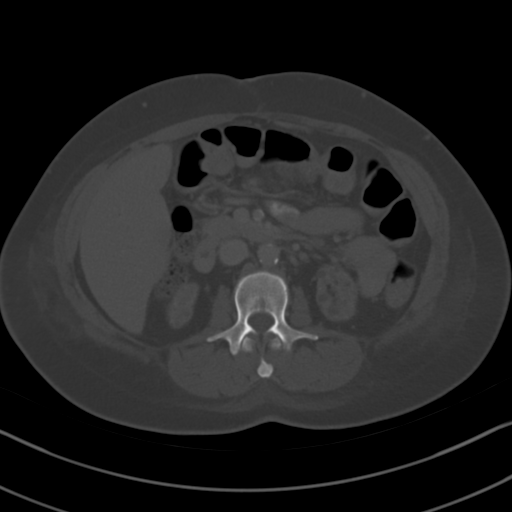
[im 65/89  soft-tissue]
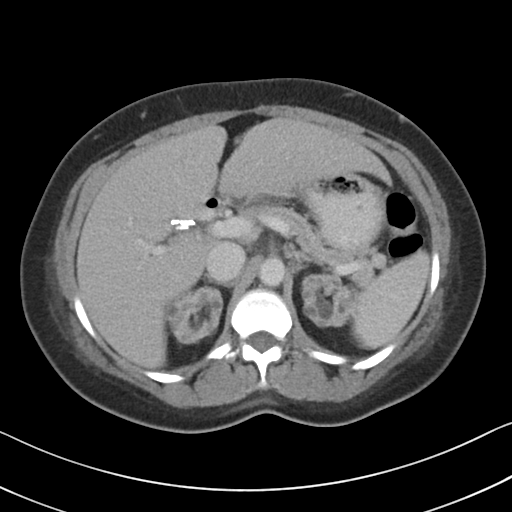
[im 70/89  soft-tissue]
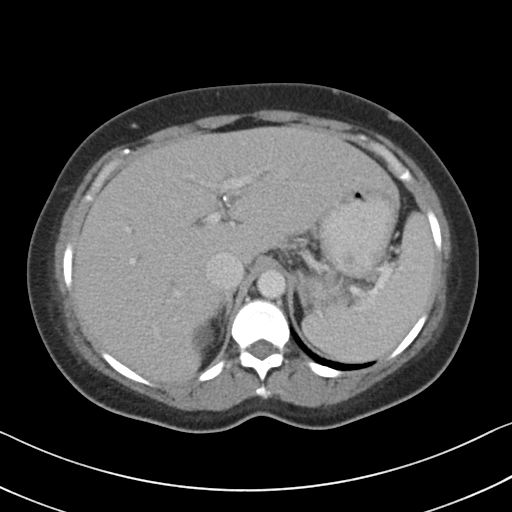
[im 75/89  soft-tissue]
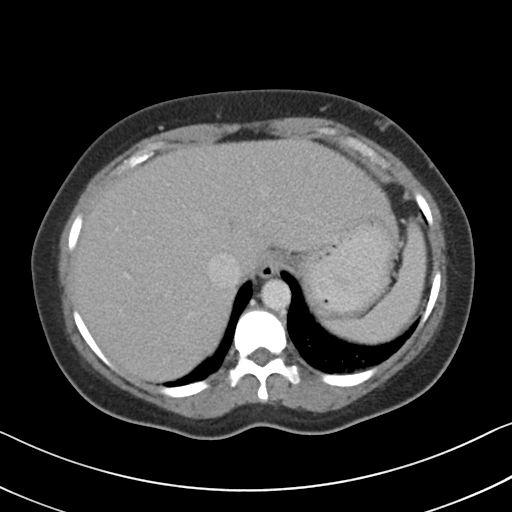
[im 84/89  soft-tissue]
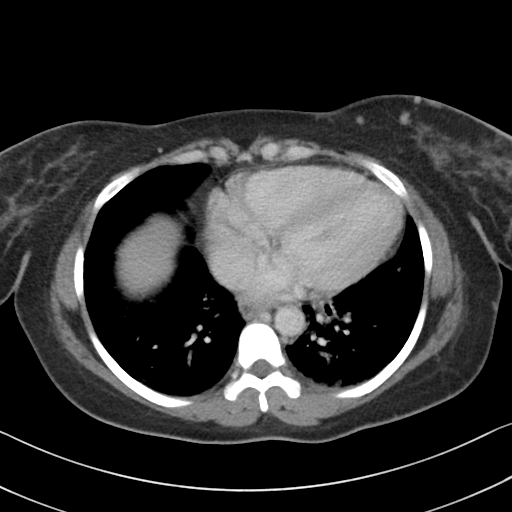

[Series 5: coronals · coronal · 0.70mm/px · 3 of 116 slices shown]
[im 39/116  soft-tissue]
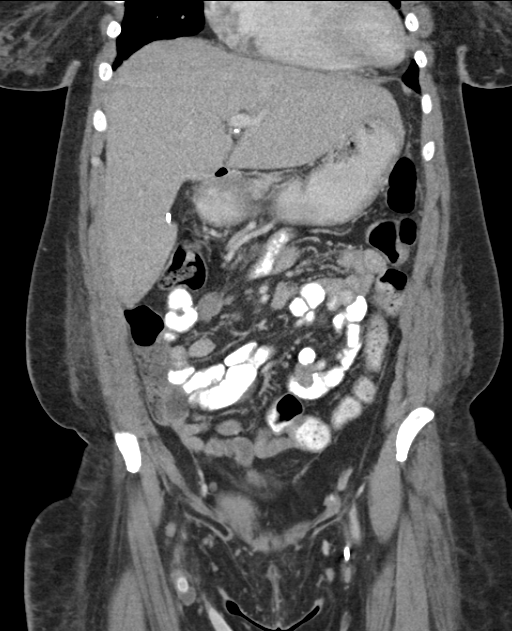
[im 52/116  soft-tissue]
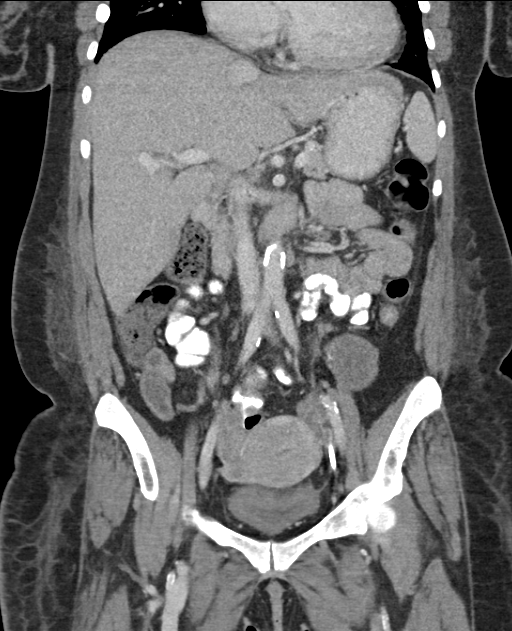
[im 64/116  soft-tissue]
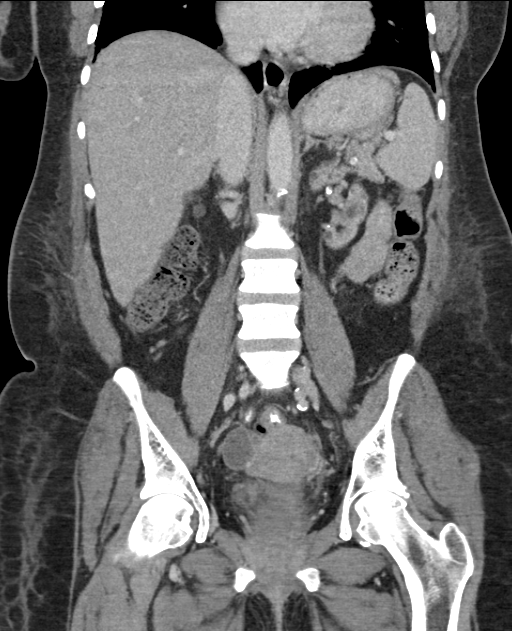

[16 of 46 positions shown; findings below may reference images not displayed]

FINDINGS: There is improved aeration of the lung bases. There is mild
bibasilar atelectasis and bronchiectasis. Left lower lobe pulmonary
veins appear mildly engorged. The heart is enlarged. There is no
significant residual pleural effusion. A small hiatal hernia is
noted.

There is stable hepatomegaly. Periportal edema has resolved. There
is no focal hepatic abnormality. The gallbladder is surgically
absent. There is no biliary dilatation. The spleen and pancreas
appear normal.

There is no adrenal mass. Both kidneys are atrophied with
innumerable cystic lesions. Some of these have enlarged compared
with the prior study, notably within the upper and lower poles of
the right kidney. No suspicious renal lesions are identified. There
is no hydronephrosis or perinephric soft tissue stranding.

The stomach, small bowel, appendix and colon demonstrate no
significant findings. Postsurgical changes in the right lower
quadrant from previously reported pancreatic transplant are stable.
Probable old renal transplant in the left iliac fossa appears
unchanged. There is diffuse atherosclerosis without evidence of
large vessel occlusion. The uterus, ovaries and bladder appear
unchanged. There are postsurgical changes within the anterior
abdominal wall. There are changes within the subcutaneous fat of the
anterior abdominal wall consistent subcutaneous injections. Left
femoral venous catheter and right groin vascular graft are noted.

There are no worrisome osseous findings.
IMPRESSION: 1. No acute abdominal pelvic findings. No explanation for the
patient's symptoms identified.
2. Innumerable cystic lesions in both kidneys, some increased in
size in the interval. No worrisome lesions identified.
3. Stable hepatomegaly.

## 2015-12-19 IMAGING — CR DG CHEST 1V PORT
1 series · 1 of 1 positions shown · non-contrast
Comparison: 11/23/2013

CLINICAL DATA: Increased breast sounds

EXAM:
PORTABLE CHEST - 1 VIEW

[AP]
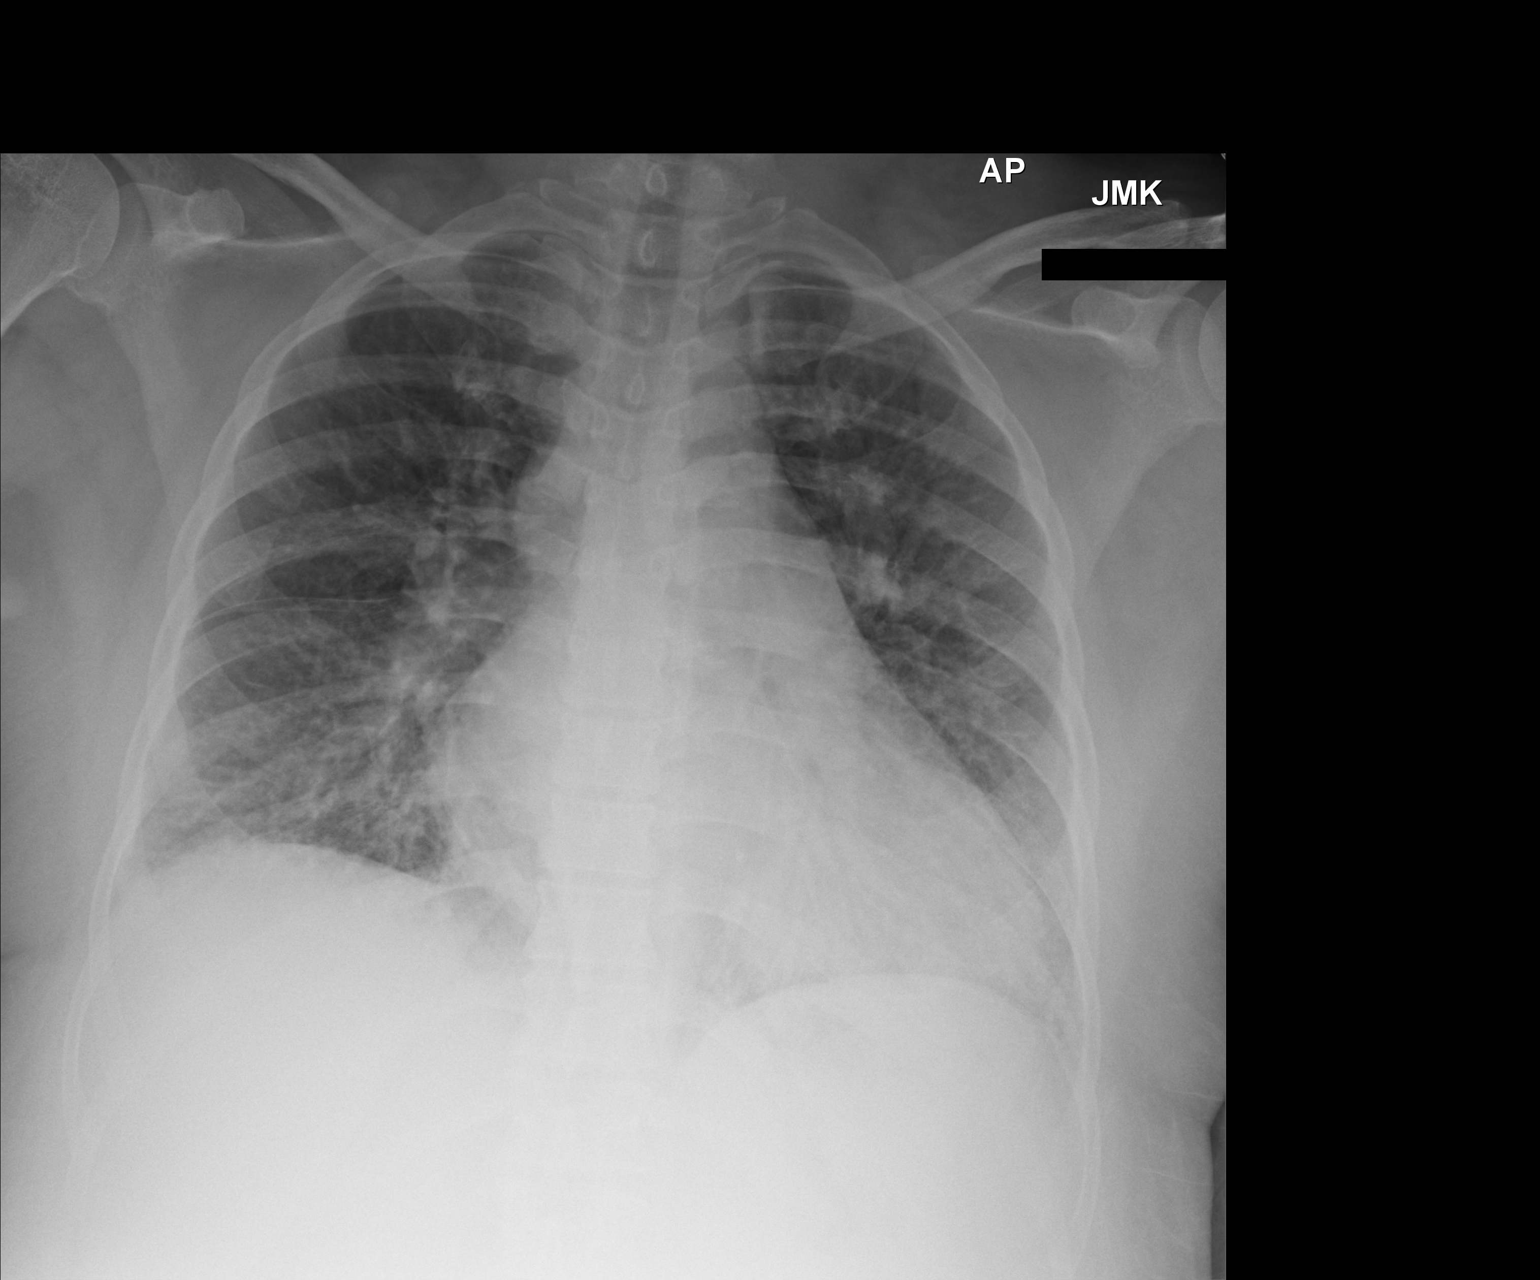

[1 of 1 positions shown; findings below may reference images not displayed]

FINDINGS: The endotracheal tube is been removed. The cardiac shadow is stable.
The lungs are well aerated bilaterally. Mild bibasilar opacities are
again seen and stable. No new focal abnormality is seen.
IMPRESSION: Stable bibasilar opacities when compared with the prior exam.

## 2016-02-17 IMAGING — CR DG CHEST 2V
2 series · 2 of 2 positions shown · non-contrast
Comparison: 01/14/2014

CLINICAL DATA: Shortness of breath, dialysis patient

EXAM:
CHEST  2 VIEW

[w chest pa]
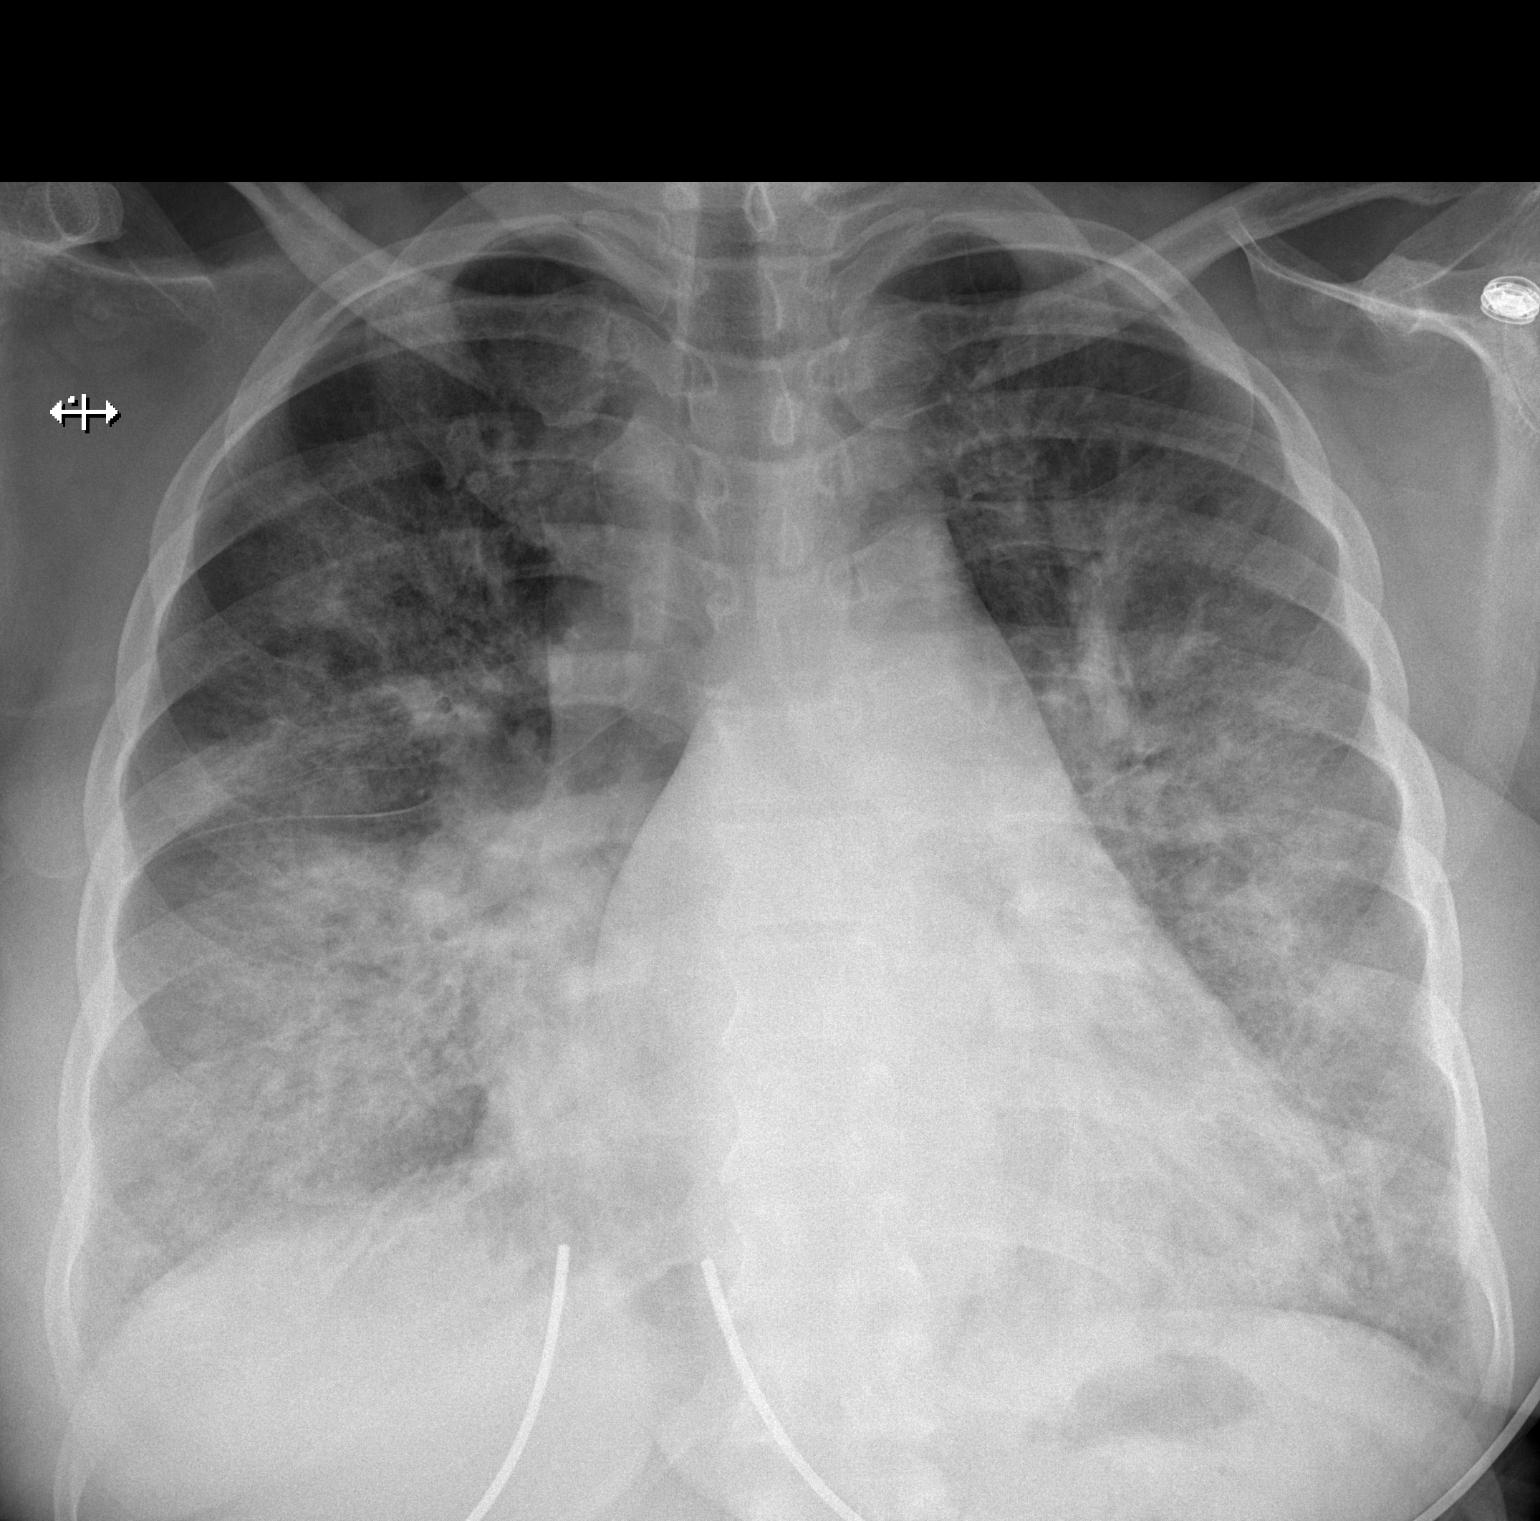

[w chest lat]
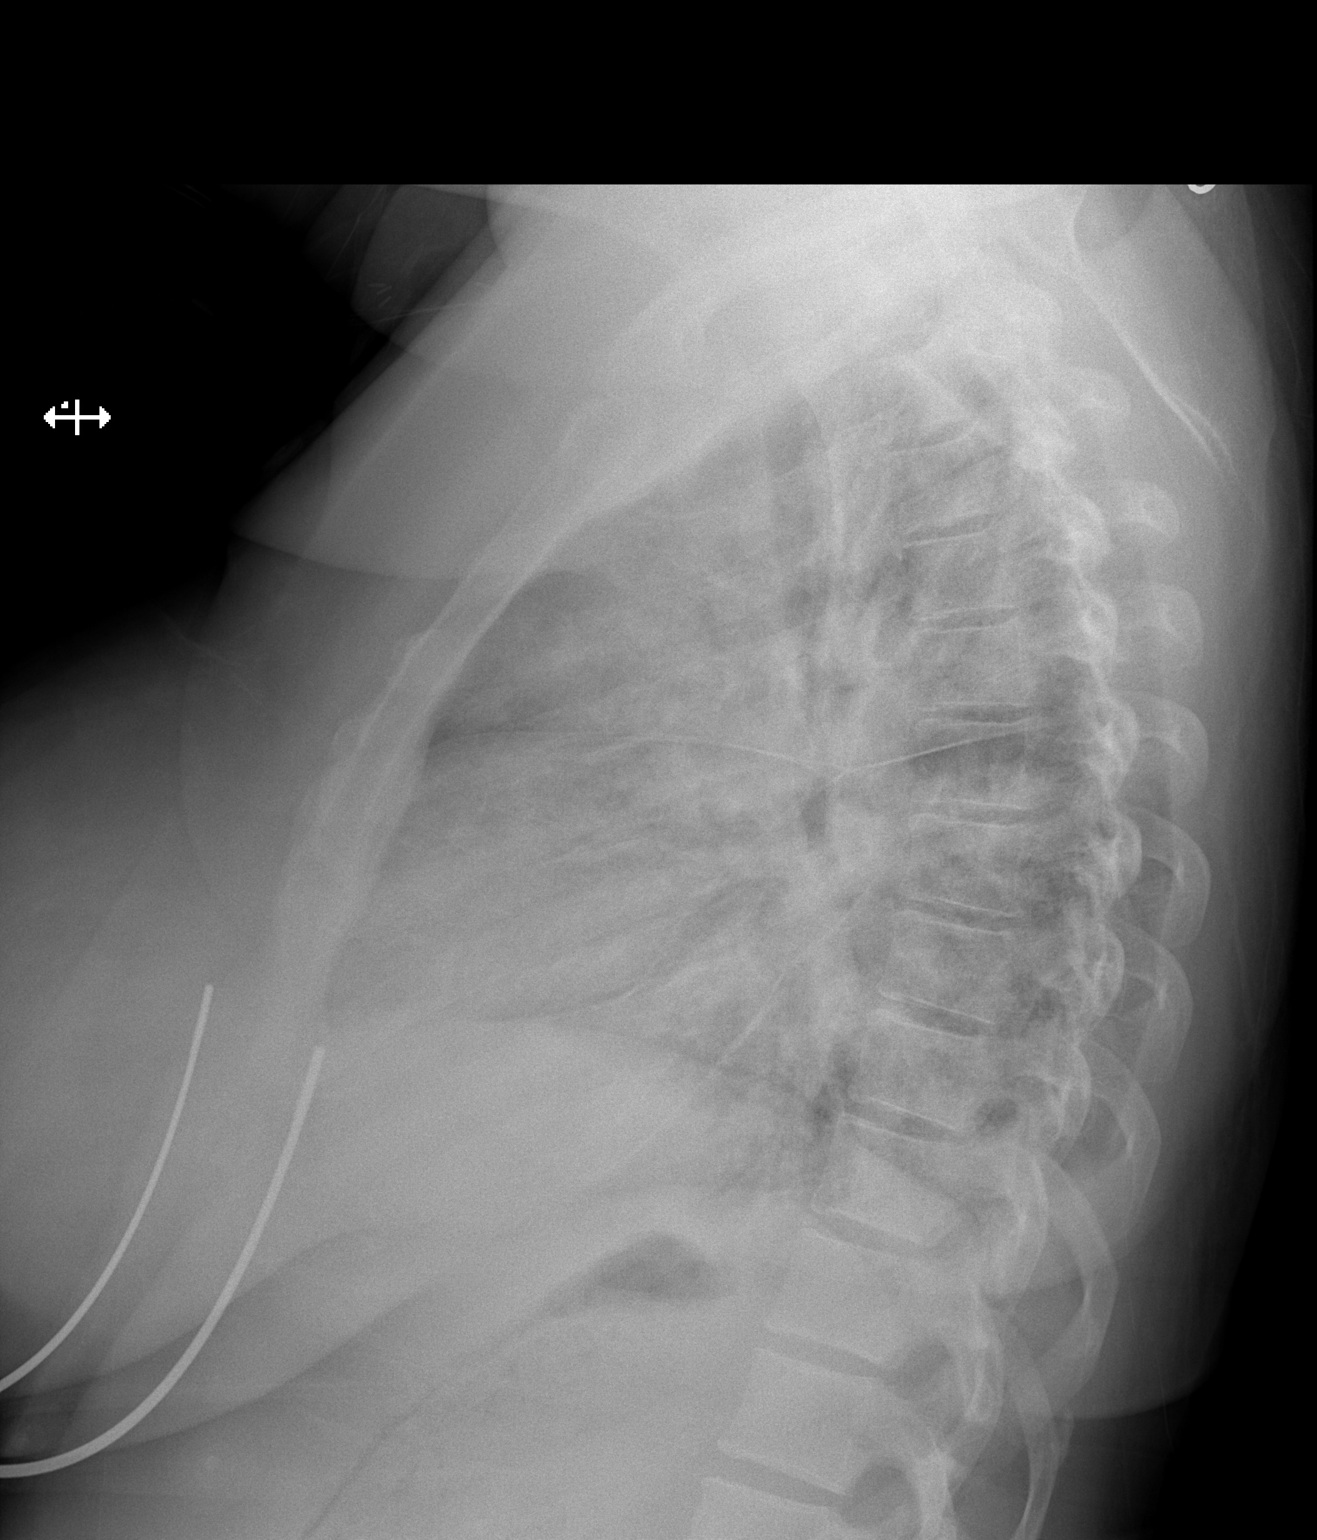

[2 of 2 positions shown; findings below may reference images not displayed]

FINDINGS: Heart size upper normal to mildly enlarged. Central vascular
congestion. Perihilar interstitial and hazy airspace opacities.
Small pleural effusions not excluded. No pneumothorax. No acute
osseous finding.
IMPRESSION: Enlarged cardiac silhouette with central vascular congestion.

Bilateral interstitial and hazy airspace opacities ; pulmonary edema
(favored) versus multifocal pneumonia.

## 2016-05-05 IMAGING — CR DG CHEST 2V
2 series · 2 of 2 positions shown · non-contrast
Comparison: 04/13/2014

CLINICAL DATA: Evaluate for possible pneumonia, shortness of
breath, productive cuff

EXAM:
CHEST  2 VIEW

[w chest pa]
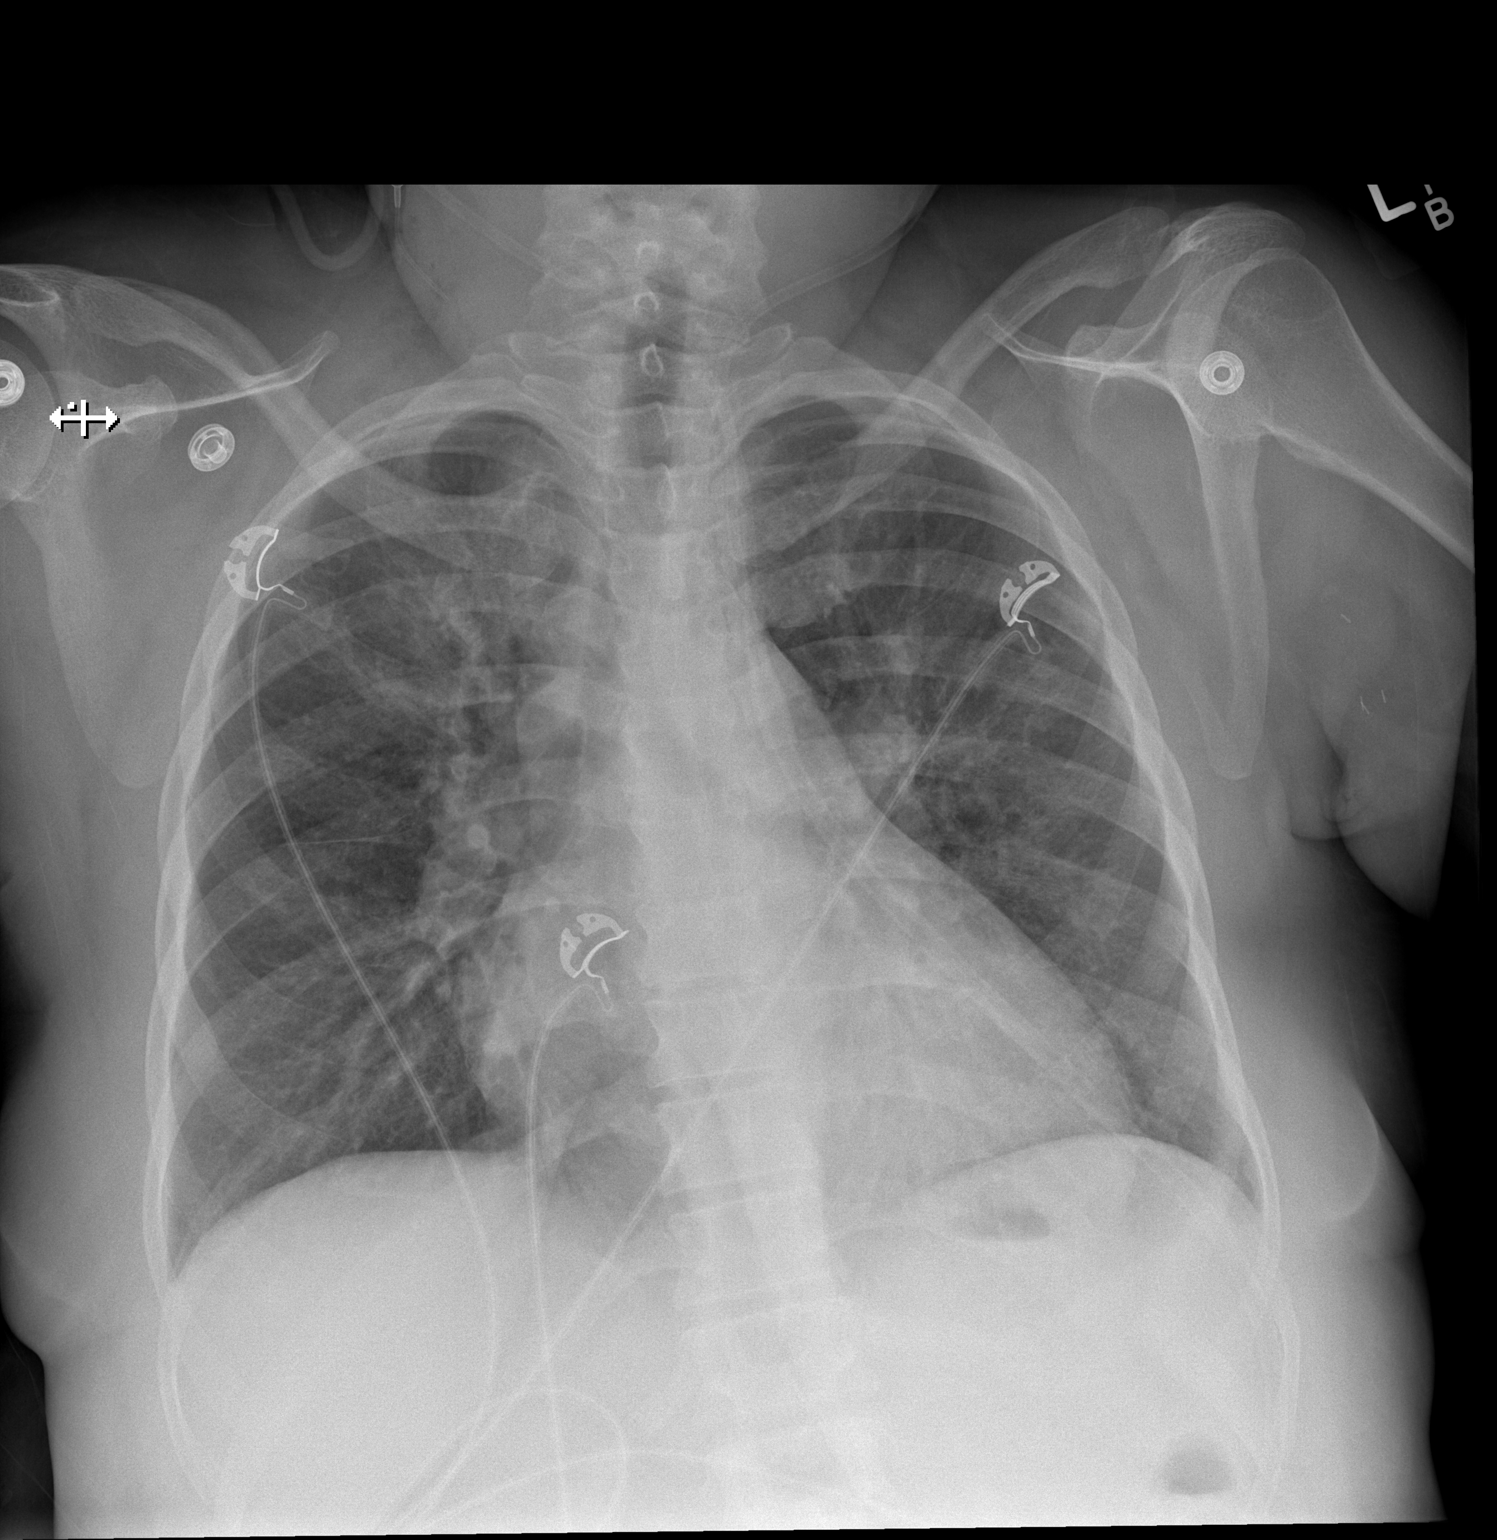

[w chest lat]
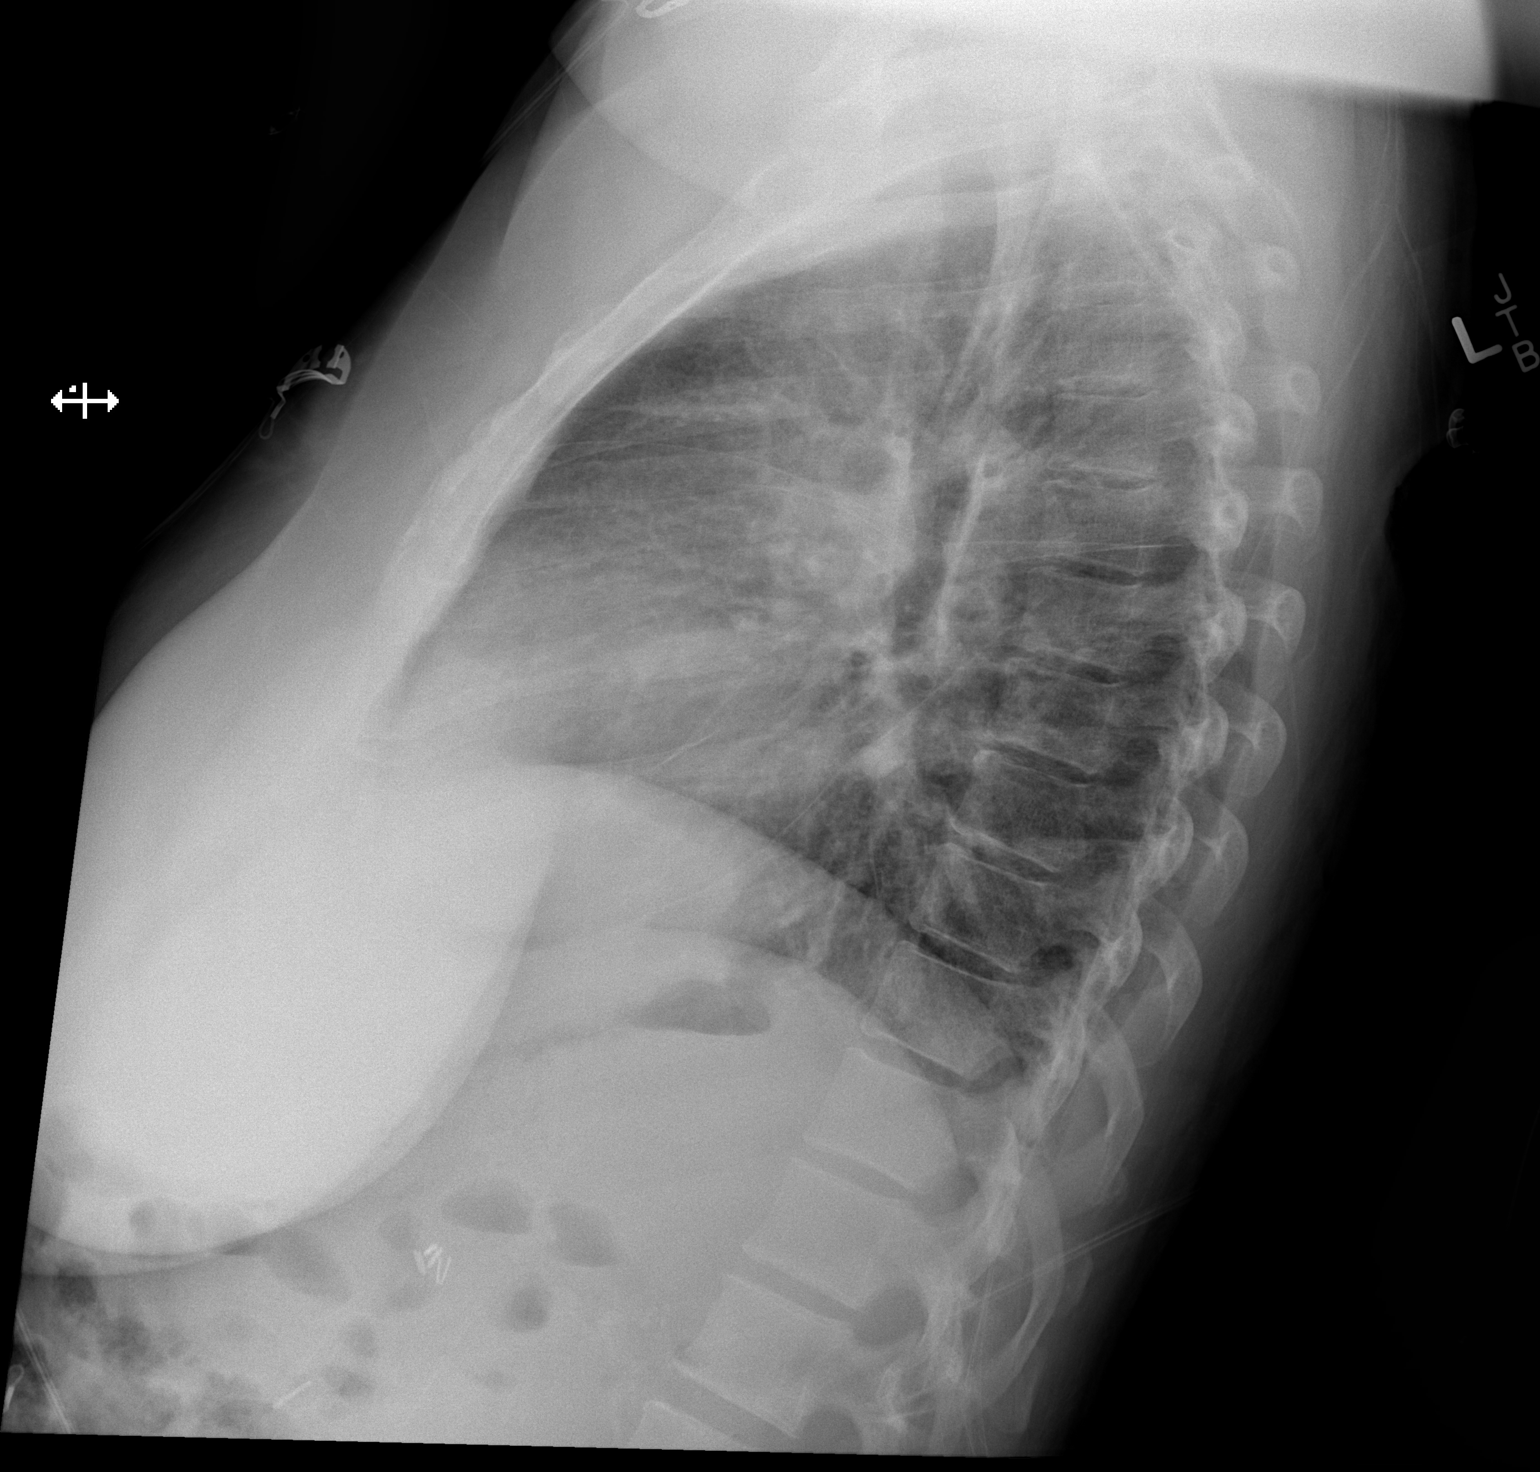

[2 of 2 positions shown; findings below may reference images not displayed]

FINDINGS: Cardiomediastinal silhouette is stable. There is improvement in
aeration without convincing pulmonary edema or segmental infiltrate.
Residual mild interstitial prominence bilateral upper lobes. Bony
thorax is unremarkable.
IMPRESSION: Improvement in aeration without convincing pulmonary edema. No
segmental infiltrate. Residual mild interstitial prominence
bilateral upper lobes.

## 2016-05-05 IMAGING — XA IR FLUORO GUIDE CV LINE*R*
1 series · 12 of 15 positions shown · non-contrast
Comparison: none

CLINICAL DATA: renal failure, needs  durable venous access.

EXAM:
RIGHT IJ CENTRAL VENOUS CATHETER PLACEMENT UNDER ULTRASOUND AND
FLUOROSCOPIC GUIDANCE:
FLUOROSCOPY TIME:  6 min 12 seconds
TECHNIQUE: The procedure, risks (including but not limited to bleeding,
infection, organ damage ), benefits, and alternatives were explained
to the patient. Questions regarding the procedure were encouraged
and answered. The patient understands and consents to the procedure.
The patient had an indwelling ready VIOLET peripheral IV, so initially
left-sided approach was selected. Left neck prepped with
chlorhexidine, draped in usual sterile fashion. Maximal barrier
sterile technique was utilized including caps, mask, sterile gowns,
sterile gloves, sterile drape, hand hygiene and skin antiseptic.
Overlying skin infiltrated with 1% lidocaine. Under real-time
ultrasound guidance, the left IJ vein was accessed with a 21 gauge
micropuncture needle. The guidewire would not advance centrally
easily. Small contrast venogram demonstrated occlusion of the
central aspect of the left IJ vein. For this reason, a right-sided
approach was used. Patency of the right IJ vein was confirmed with
ultrasound with image documentation. An appropriate skin site was
determined. Skin site was marked. Region was prepped using maximum
barrier technique including cap and mask, sterile gown, sterile
gloves, large sterile sheet, and Chlorhexidine as cutaneous
antisepsis. The region was infiltrated locally with 1% lidocaine.
Under real-time ultrasound guidance, the right IJ vein was accessed
with a 21 gauge micropuncture needle; the needle tip within the vein
was confirmed with ultrasound image documentation. The guidewire
would not advance centrally easily. Contrast venogram demonstrated
occlusion of the central aspect of the right IJ vein, with a
prominent collateral channel to the SVC. With the aid of an angled
Glidewire, this was catheterized. A double-lumen PICC line would not
advance through the length of the tortuosity of the course. For this
reason, over the Glidewire a single lumen 5 French power injectable
PICC catheter trimmed to 18 cm was advanced, positioned with the tip
in the mid right atrium. Catheter was secured externally and flow
flushed per protocol. The patient tolerated the procedure well.
COMPLICATIONS:
None immediate

[Series 1: run · 12 of 15 slices shown]
[im 1/15]
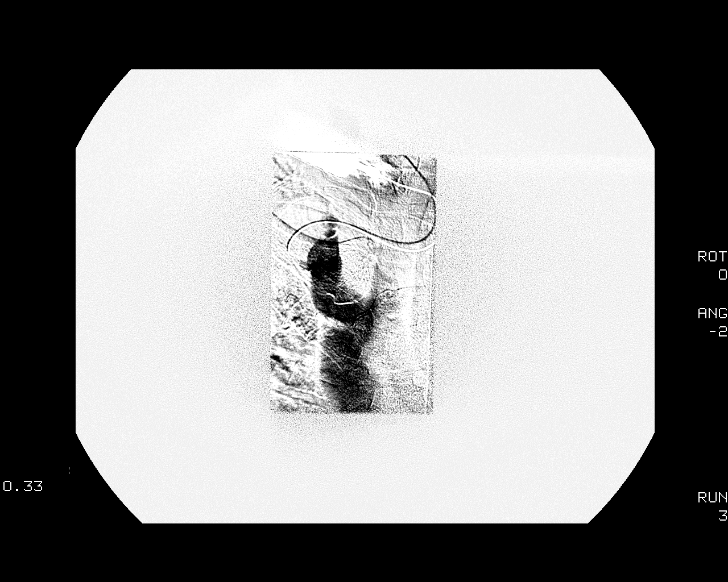
[im 2/15]
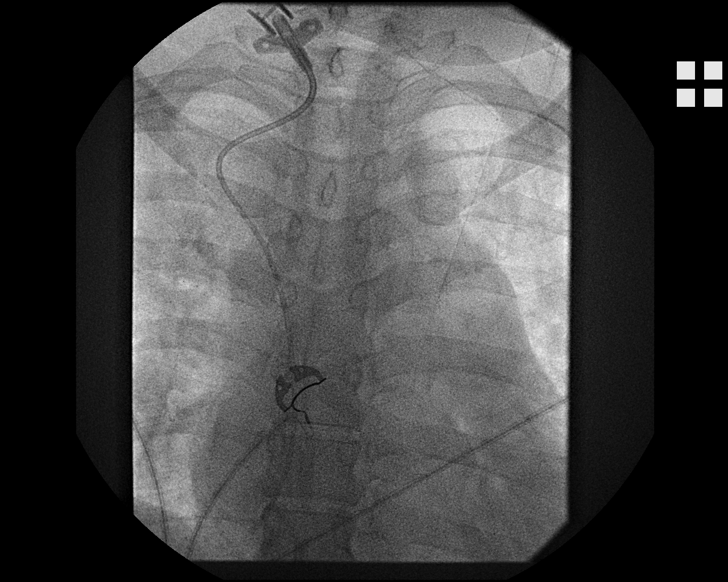
[im 4/15]
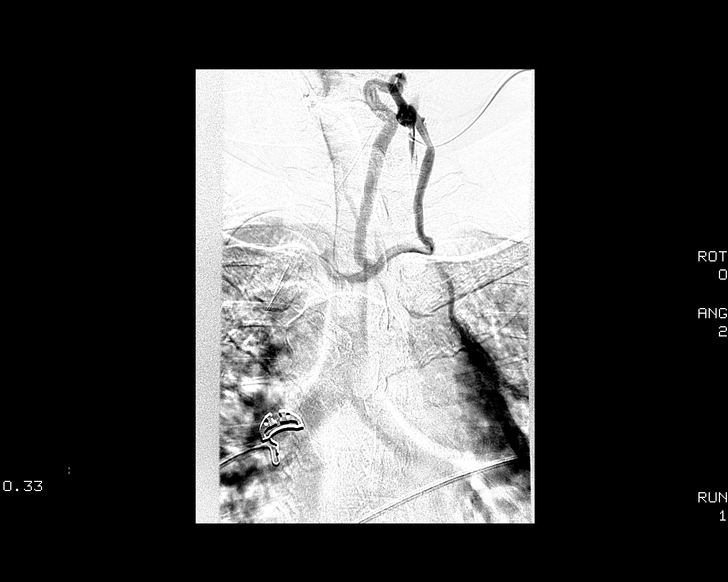
[im 5/15]
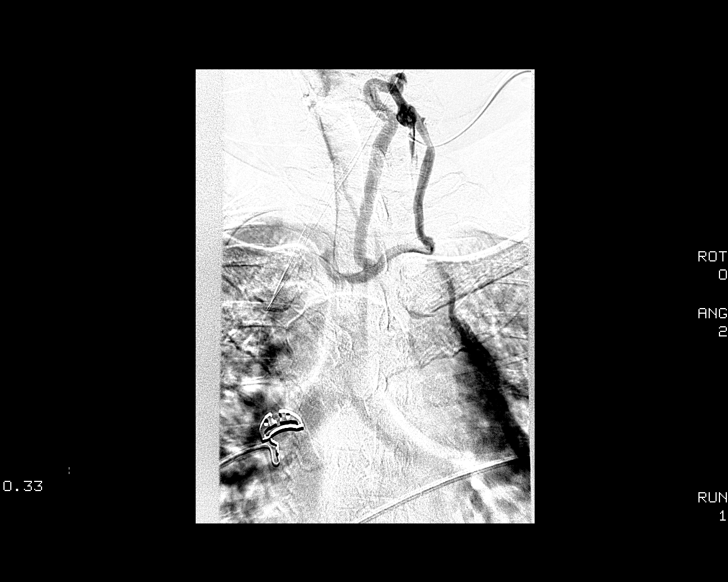
[im 6/15]
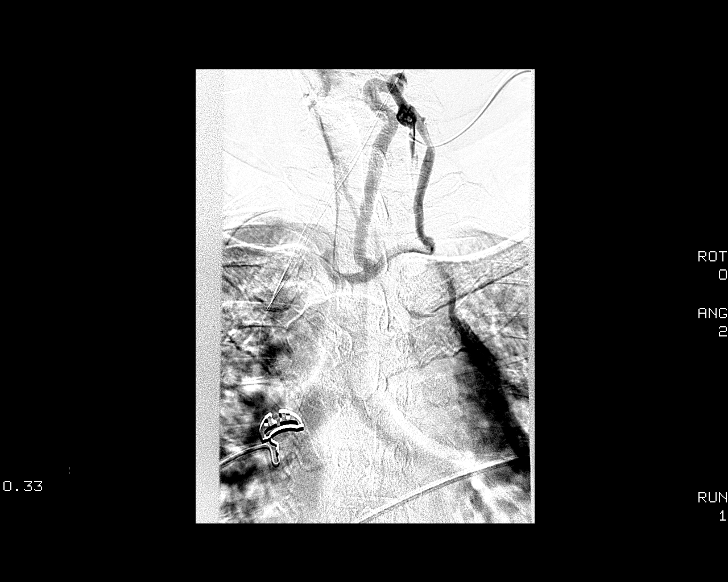
[im 7/15]
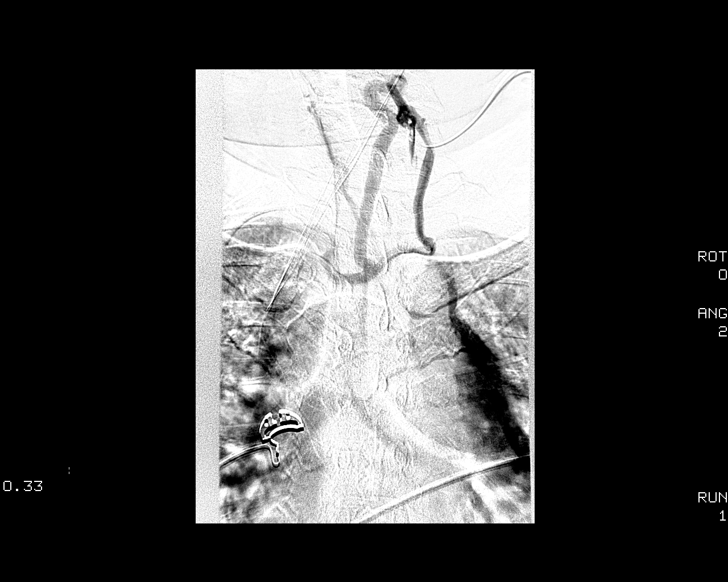
[im 9/15]
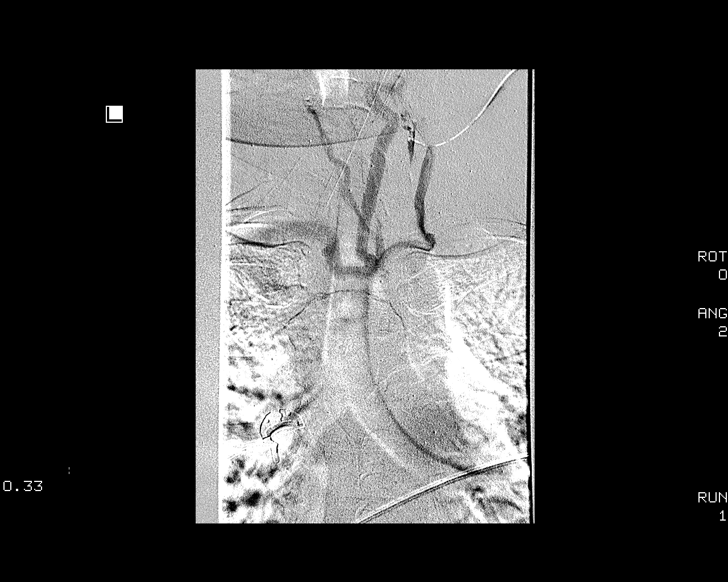
[im 10/15]
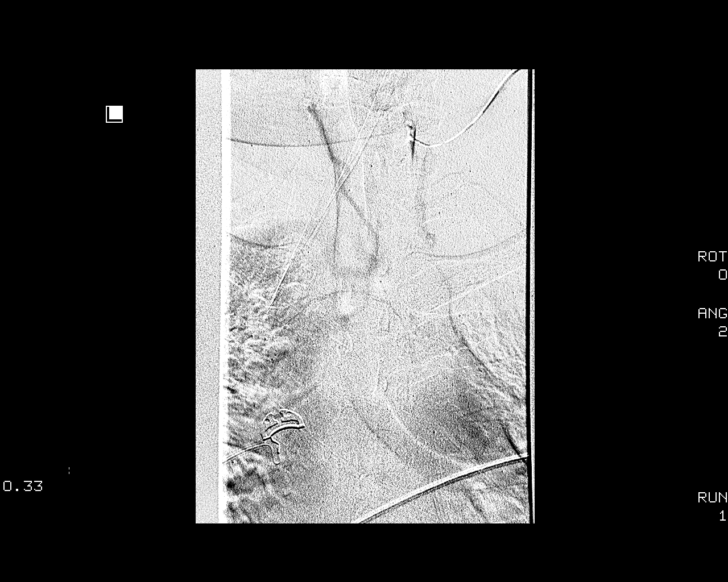
[im 11/15]
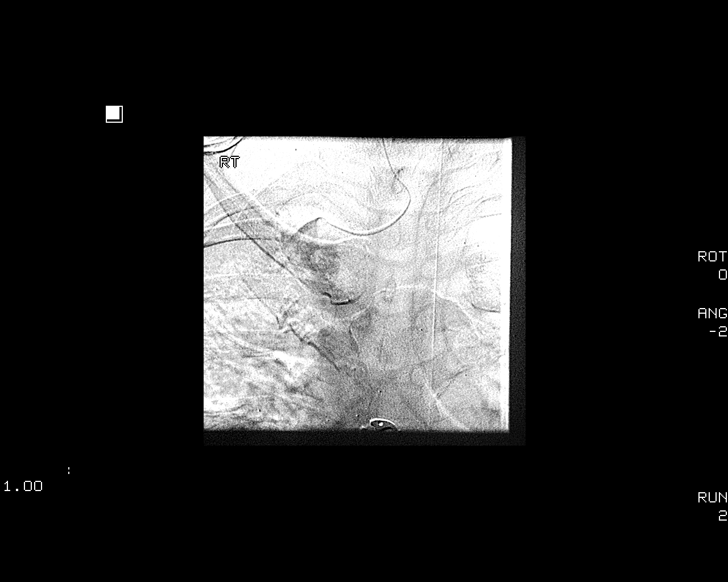
[im 12/15]
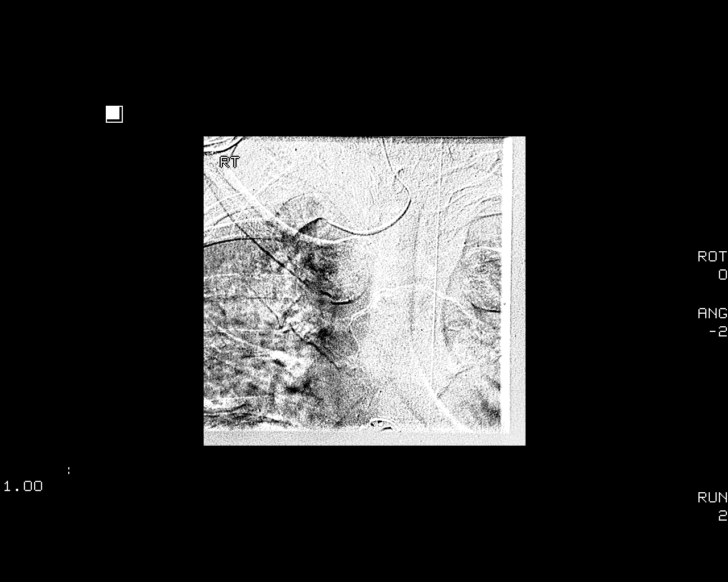
[im 14/15]
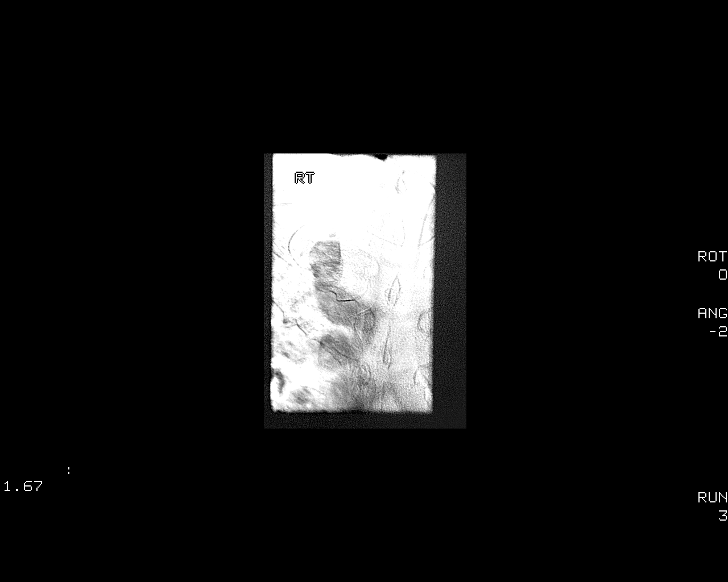
[im 15/15  full-range]
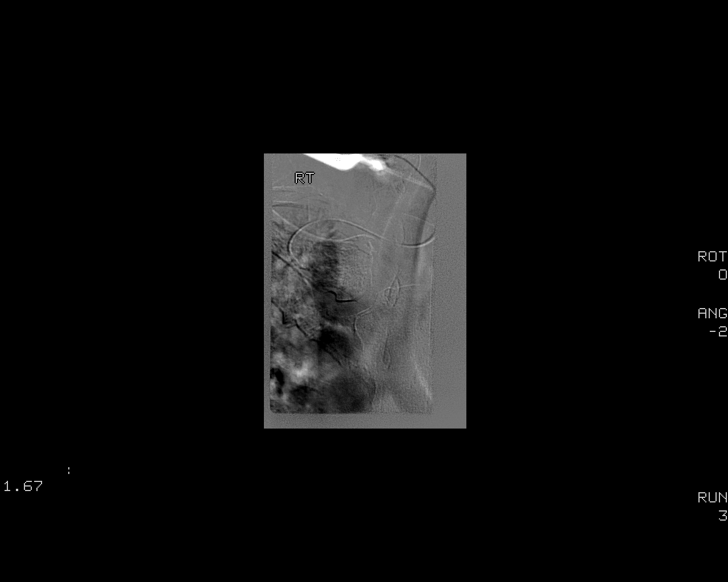

[12 of 15 positions shown; findings below may reference images not displayed]

IMPRESSION: 1. Technically successful right IJ Single lumen power injectable
central venous catheter placement.

## 2016-05-07 IMAGING — XA IR SHUNTOGRAM/ FISTULAGRAM *R*
2 series · 13 of 24 positions shown · IV contrast (IODINE)
Comparison: None

CLINICAL DATA: Pain overlying right thigh dialysis graft with right
lower extremity edema. History of iliac venous outflow stenoses and
prior angioplasty.

EXAM:
1.  DIALYSIS AV SHUNTOGRAM
2.  ANGIOPLASTY OF RIGHT EXTERNAL AND COMMON ILIAC VEINS

[Series 300: dsa body · 4 of 18 slices shown (1 of 2)]
[im 1/18]
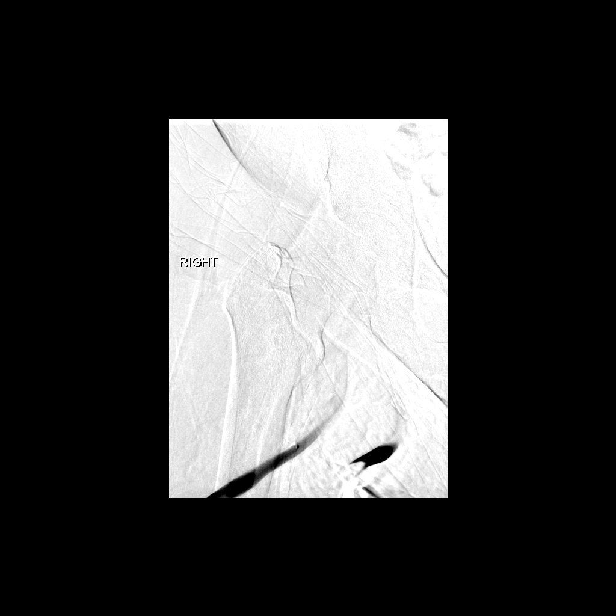
[im 6/18]
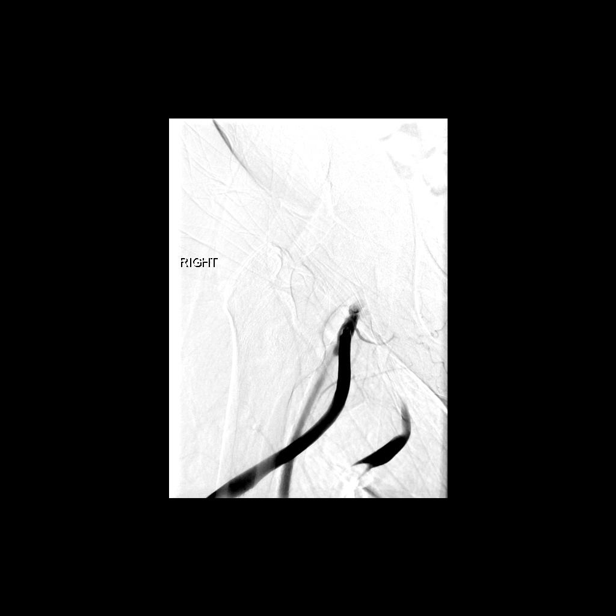
[im 12/18]
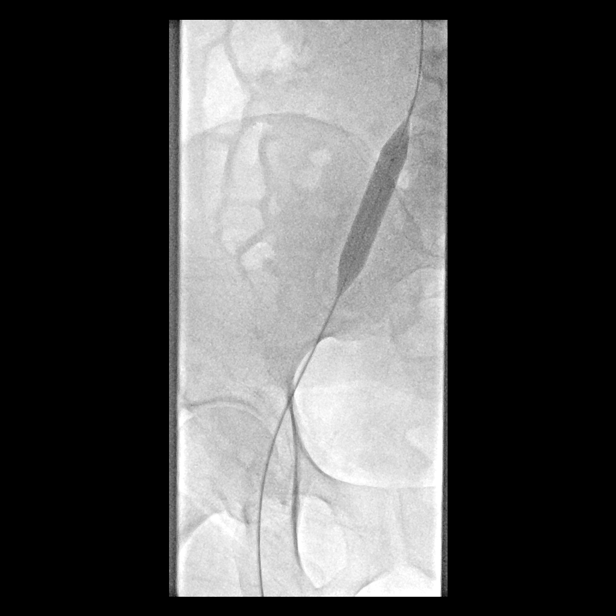
[im 18/18]
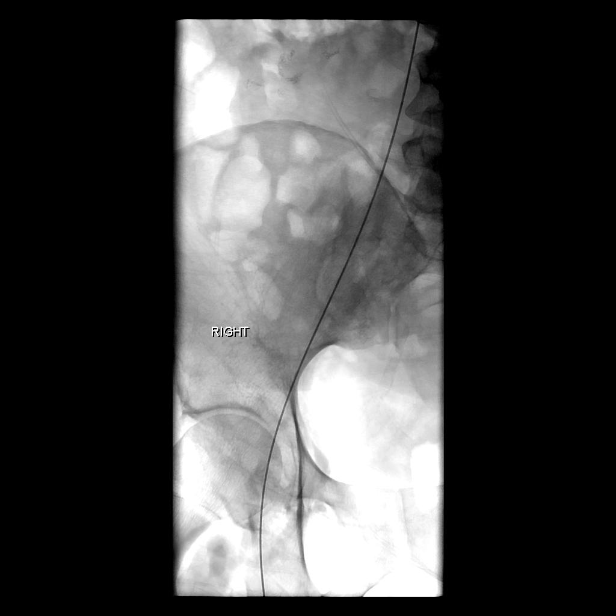

[Series 300: dsa body · 9 of 46 slices shown (2 of 2)]
[im 3/46]
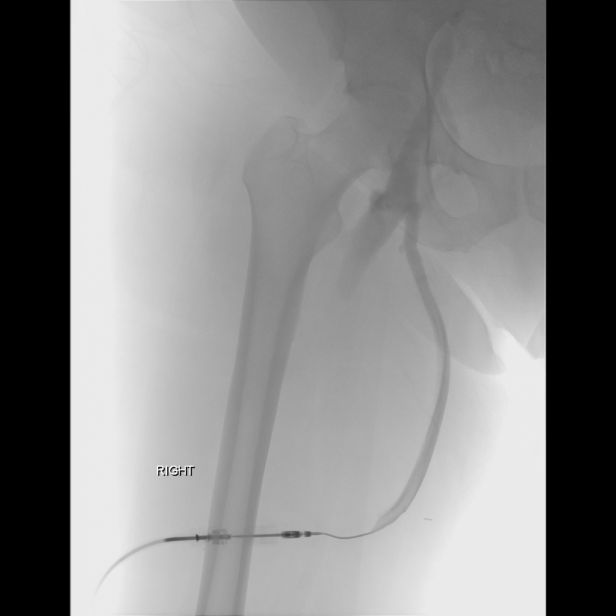
[im 9/46]
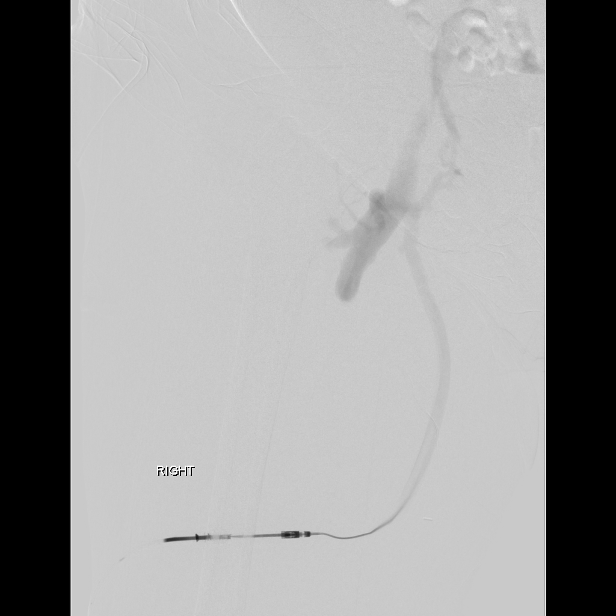
[im 15/46]
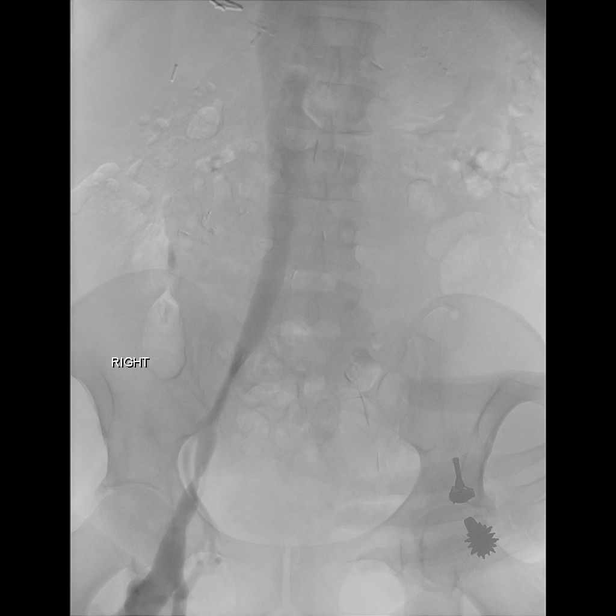
[im 17/46]
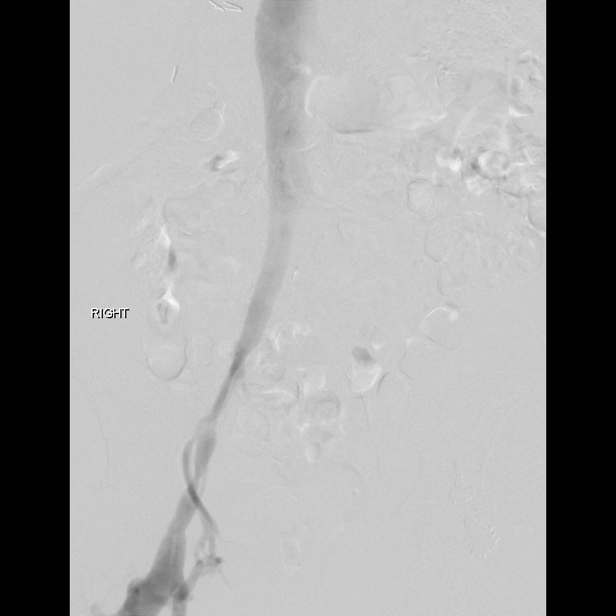
[im 23/46]
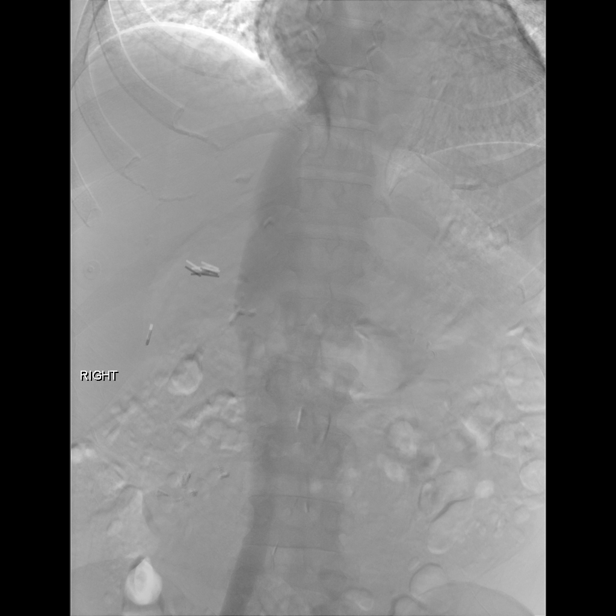
[im 29/46]
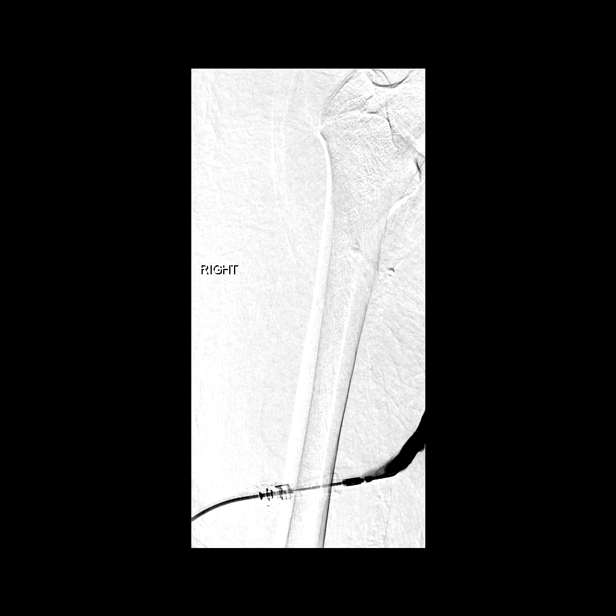
[im 34/46]
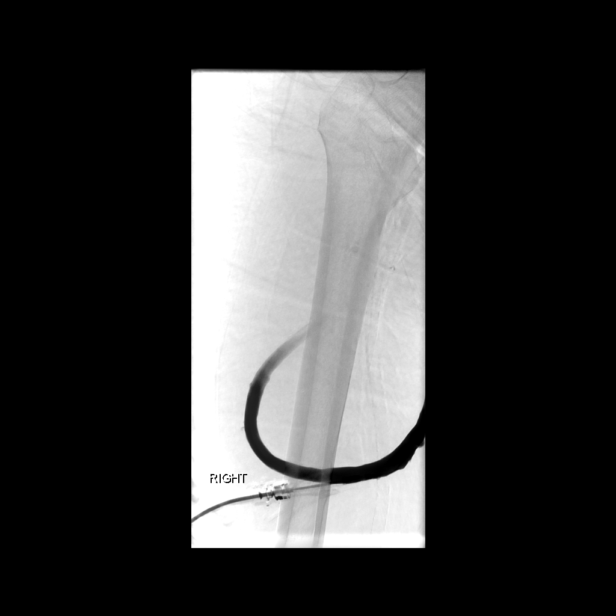
[im 40/46]
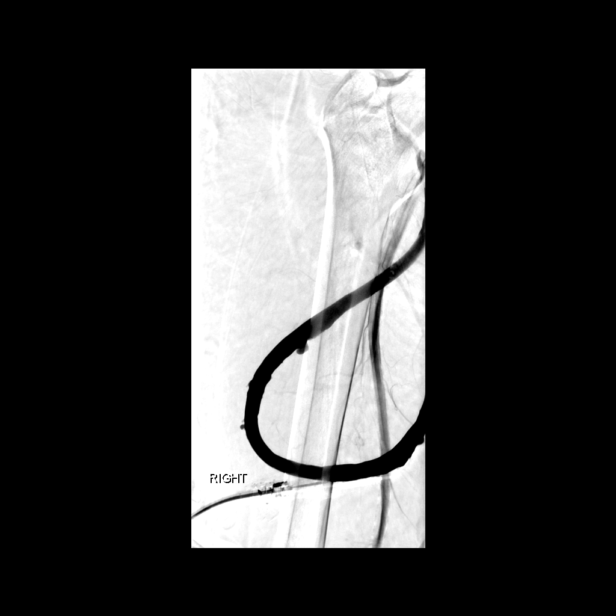
[im 46/46]
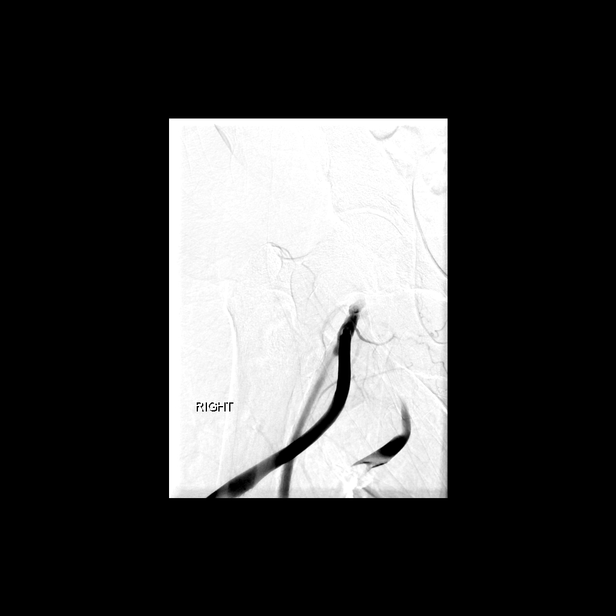

[13 of 24 positions shown; findings below may reference images not displayed]

CONTRAST:  50 mL Omnipaque 300

FLUOROSCOPY TIME:  1 min and 54 seconds

MEDICATIONS:
50 mcg IV fentanyl

PROCEDURE:
The procedure, risks, benefits, and alternatives were explained to
the patient. Questions regarding the procedure were encouraged and
answered. The patient understands and consents to the procedure.

The right thigh dialysis graft was prepped with Betadine in a
sterile fashion, and a sterile drape was applied covering the
operative field. A diagnostic shunt study was performed via an 18
gauge angiocatheter introduced into venous outflow. Venous drainage
was assessed to the level of the central veins in the chest.
Proximal shunt was studied by reflux maneuver with temporary
compression of venous outflow.

The angiocath was removed and replaced with a 7-French sheath over a
guidewire. Balloon angioplasty of the right external iliac vein was
then performed with a 10 mm x 4 cm Conquest balloon. Angiography was
performed.

Additional balloon angioplasty was performed of the right common
iliac vein with a 12 mm x 4 cm Atlas balloon.

Balloon angioplasty was followed by additional angiography. The
sheath was removed and hemostasis obtained with application of a 2-0
Ethilon pursestring suture.

COMPLICATIONS:
None
FINDINGS: The in loop graft in the right thigh is normally patent including
arterial and venous anastomoses. The graft itself shows small focal
pseudoaneurysms related to prior puncture sites. Venous outflow
demonstrates focal stenosis of the distal external iliac vein of
approximately 80%. Longer segmental stenosis of the common iliac
vein approaches 80% in estimated caliber. The inferior vena cava is
widely patent.

After 10 mm balloon angioplasty, there was improvement in appearance
of the external iliac vein stenosis with mild residual roughly 25%
stenosis present. There remained moderate degree of common iliac
vein narrowing of approximately 60- 70%. After further 12 mm balloon
angioplasty of the common iliac vein, there is further improvement
in patency with only mild 20- 25% narrowing remaining. Excellent
flow was present through the outflow veins on completion.
IMPRESSION: Significant stenoses of venous outflow at the level of the right
external and common iliac veins. These were successfully treated
with 10 mm and 12 mm balloon angioplasty with significant
angiographic improvement, as above.

ACCESS:
The graft and venous outflow remain amenable to percutaneous
intervention.

## 2016-05-09 IMAGING — MR MR MRA HEAD W/O CM
10 of 11 series · 30 of 48 positions shown · non-contrast
Comparison: Non contrast head CTs 06/27/2013 and earlier.

CLINICAL DATA: 43-year-old female with acute numbness and tingling
in the right side extremity for 5 days. Initial encounter.

EXAM:
MRI HEAD WITHOUT CONTRAST
MRA HEAD WITHOUT CONTRAST
TECHNIQUE: Multiplanar, multiecho pulse sequences of the brain and surrounding
structures were obtained without intravenous contrast. Angiographic
images of the head were obtained using MRA technique without
contrast.

[Series 3: T1 · sagittal · 5.0mm · 0.47mm/px · 2 of 23 slices shown]
[im 1/23]
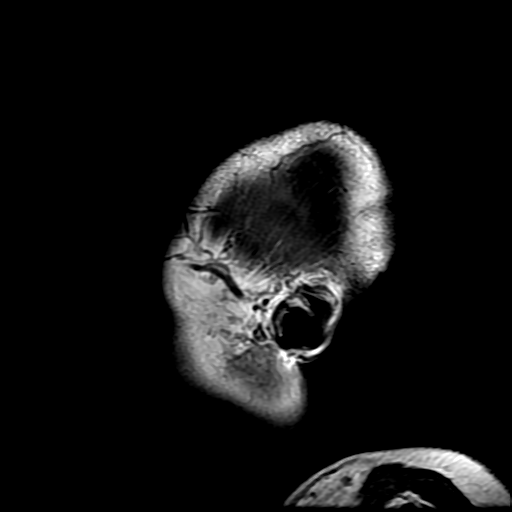
[im 23/23]
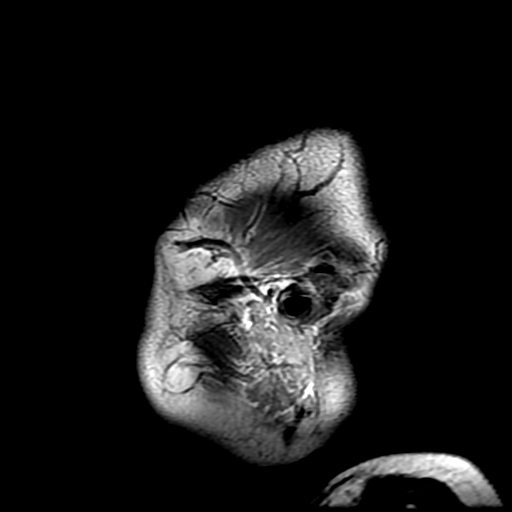

[Series 4: DWI · axial · 5.0mm · 1.09mm/px · z∈[-41,+103]mm · 5 of 60 slices shown (1 of 4)]
[im 1/60]
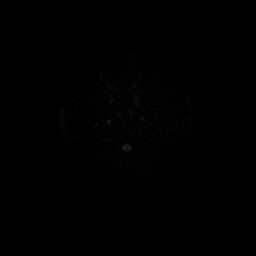
[im 15/60]
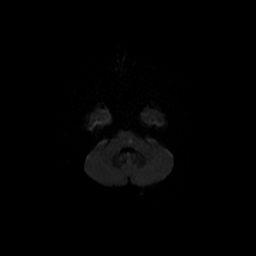
[im 30/60]
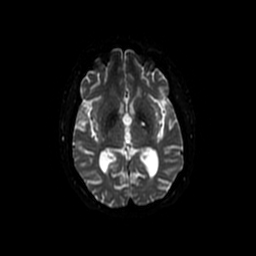
[im 45/60]
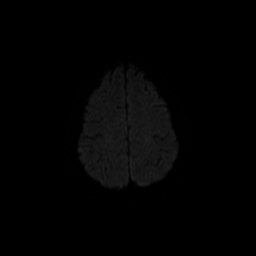
[im 60/60]
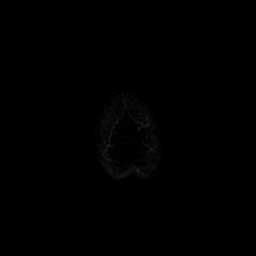

[Series 5: FLAIR · axial · 5.0mm · 0.43mm/px · z∈[-40,+103]mm · 2 of 25 slices shown]
[im 1/25]
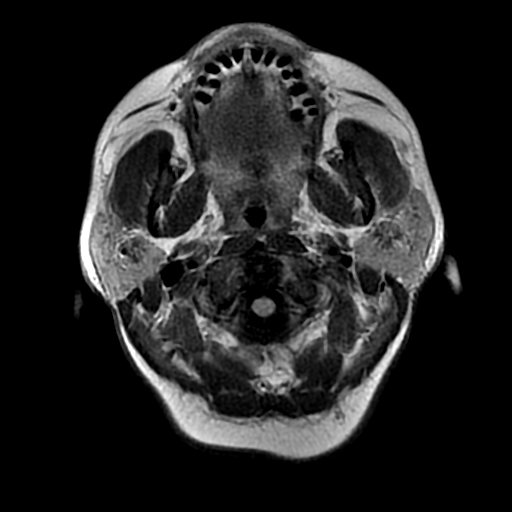
[im 25/25]
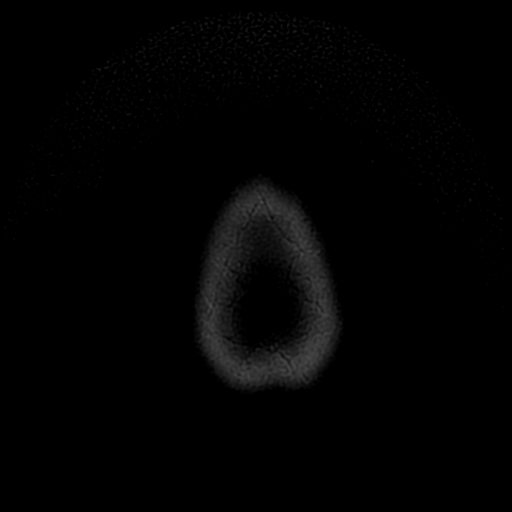

[Series 6: T2 · axial · 5.0mm · 0.43mm/px · z∈[-40,+103]mm · 2 of 25 slices shown (1 of 2)]
[im 1/25]
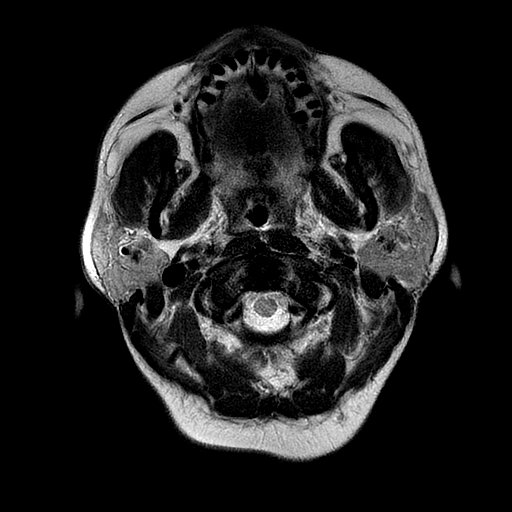
[im 25/25]
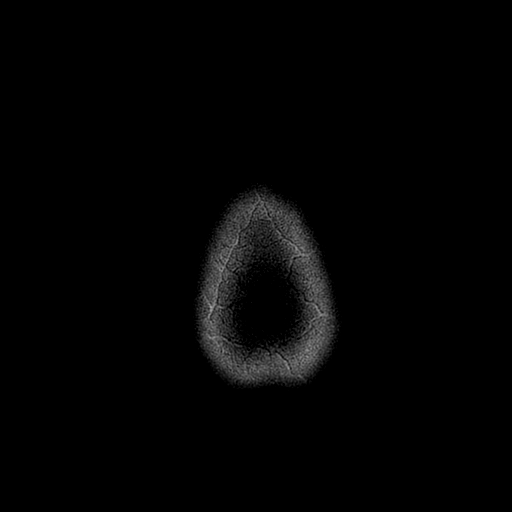

[Series 7: DWI · coronal · 5.0mm · 1.09mm/px · 6 of 68 slices shown (2 of 4)]
[im 1/68]
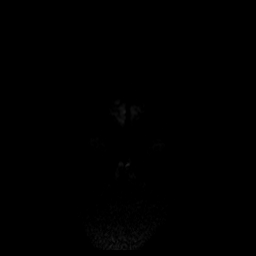
[im 14/68]
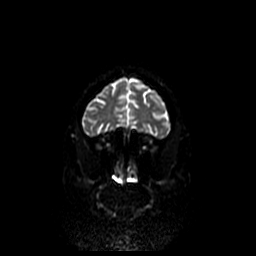
[im 27/68]
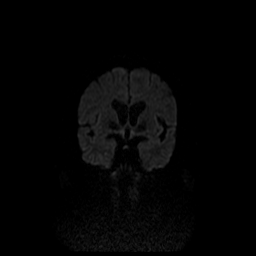
[im 41/68]
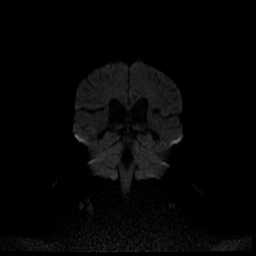
[im 54/68]
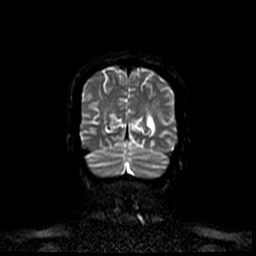
[im 68/68]
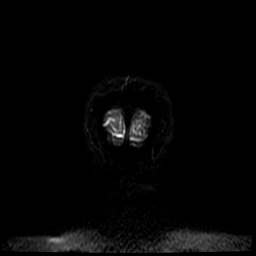

[Series 8: ax mpgr · axial · 5.0mm · 0.43mm/px · z∈[-40,+103]mm · 2 of 25 slices shown]
[im 1/25]
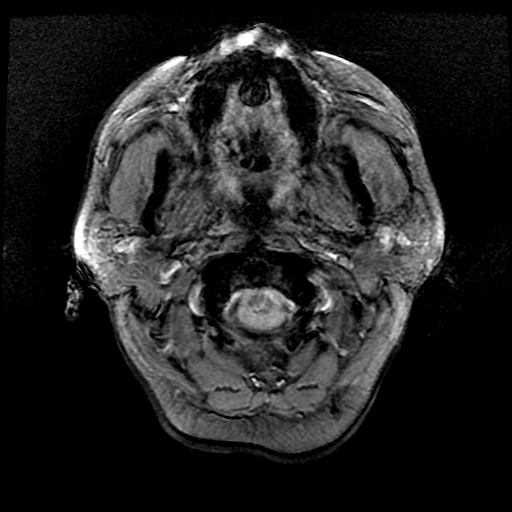
[im 25/25]
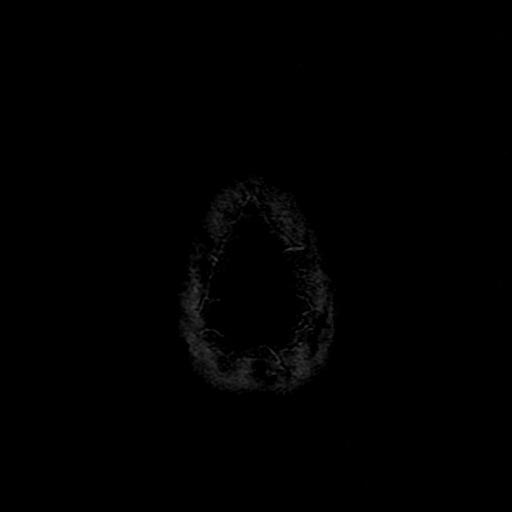

[Series 9: (id) mt fs · axial · 1.4mm · 0.43mm/px · z∈[-52,-17]mm · 3 of 154 slices shown]
[im 1/154]
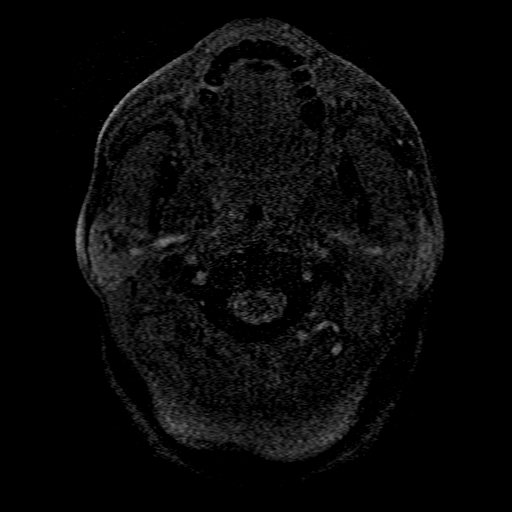
[im 26/154]
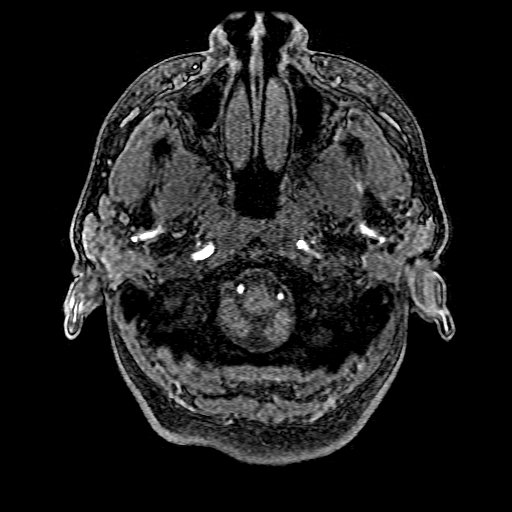
[im 52/154]
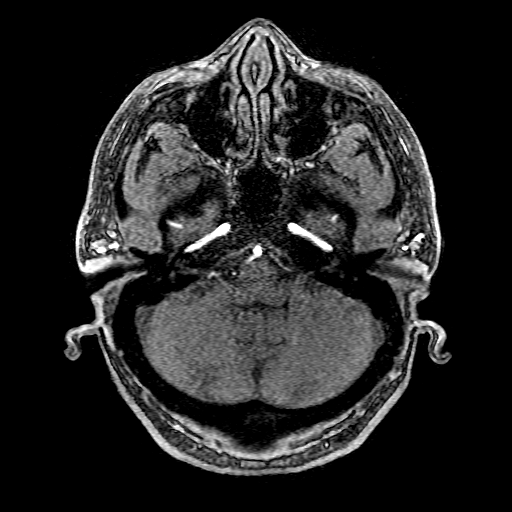

[Series 11: T2 · coronal · 5.0mm · 0.43mm/px · 2 of 29 slices shown (2 of 2)]
[im 1/29]
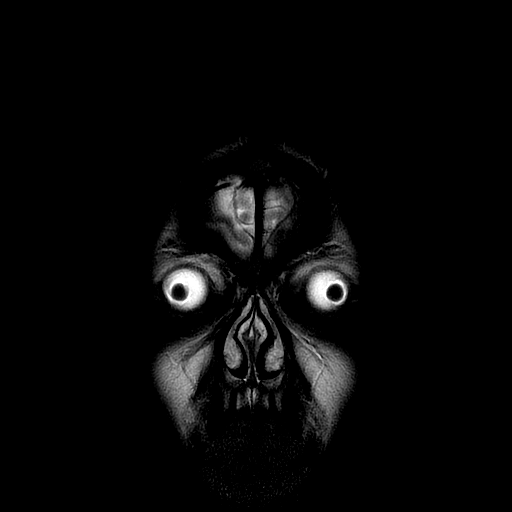
[im 29/29]
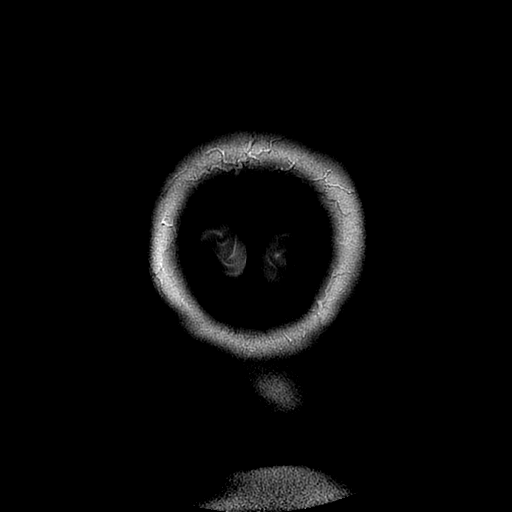

[Series 400: DWI · axial · 5.0mm · 1.09mm/px · z∈[-41,+103]mm · 3 of 30 slices shown (3 of 4)]
[im 1/30]
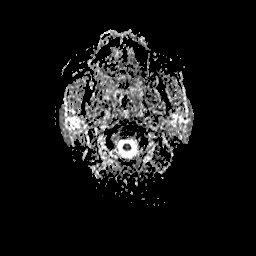
[im 15/30]
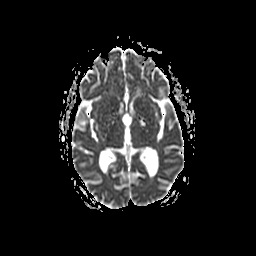
[im 30/30]
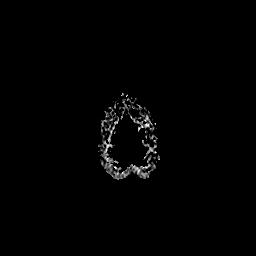

[Series 700: DWI · coronal · 5.0mm · 1.09mm/px · 3 of 34 slices shown (4 of 4)]
[im 1/34]
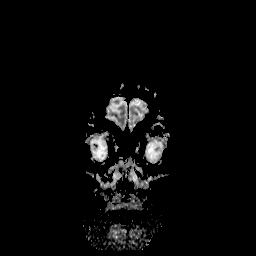
[im 17/34]
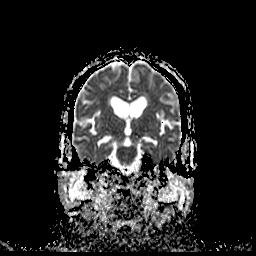
[im 34/34]
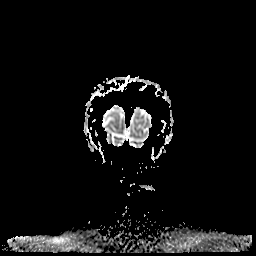

[30 of 48 positions shown; findings below may reference images not displayed]

FINDINGS: MRI HEAD FINDINGS

6 mm focus of restricted diffusion in the dorsal pons on the left
(series 4, image 9). Associated mild T2 and FLAIR hyperintensity. No
associated mass effect or hemorrhage.

No other restricted diffusion. Major intracranial vascular flow
voids are preserved.

No midline shift, mass effect, evidence of mass lesion,
ventriculomegaly, extra-axial collection or acute intracranial
hemorrhage. Cervicomedullary junction and pituitary are within
normal limits. Chronic lacunar been *SCRATCH* sec chronic lacunar
infarct in the left globus pallidus. Mild nonspecific mostly
periventricular cerebral white matter T2 and FLAIR hyperintensity
possible chronic micro hemorrhages in the anterior left frontal lobe
(series 8, image 15). No cortical encephalomalacia. Cerebellum
within normal limits. Visible internal auditory structures appear
normal.

Trace left mastoid effusion. Negative visualized nasopharynx. Other
Visualized paranasal sinuses and mastoids are clear. Visualized
orbit soft tissues are within normal limits. Visualized scalp soft
tissues are within normal limits.

Nonspecific decreased T1 bone marrow signal throughout.

MRA HEAD FINDINGS

Antegrade flow in the posterior circulation. Codominant distal
vertebral arteries. Dominant, normal AICA origins. No basilar
stenosis. SCA and PCA origins are normal. Normal posterior
communicating arteries. Bilateral PCA branches are normal.

Antegrade flow in both ICA siphons. Tortuous distal cervical ICAs,
more so the right 8. No siphon stenosis. Ophthalmic and posterior
communicating artery origins are within normal limits. Normal
carotid termini, MCA and ACA origins.

Anterior communicating artery and visualized bilateral ACA and MCA
branches are within normal limits.
IMPRESSION: 1. Acute 6 mm lacunar infarct in the left dorsal pons. No mass
effect or hemorrhage.
2. Other evidence of chronic small vessel disease including left
globus pallidus chronic lacune, possible chronic micro hemorrhages
left frontal lobe.
3. Negative intracranial MRA, but tortuous cervical ICAs raising the
possibility of chronic hypertension.

## 2016-06-07 IMAGING — XA IR FLUORO GUIDE CV LINE*R*
1 series · 2 of 2 positions shown · non-contrast
Comparison: none

CLINICAL DATA: End-stage renal disease, hyperglycemia

[Series 1: run · 2 of 2 slices shown]
[im 1/2]
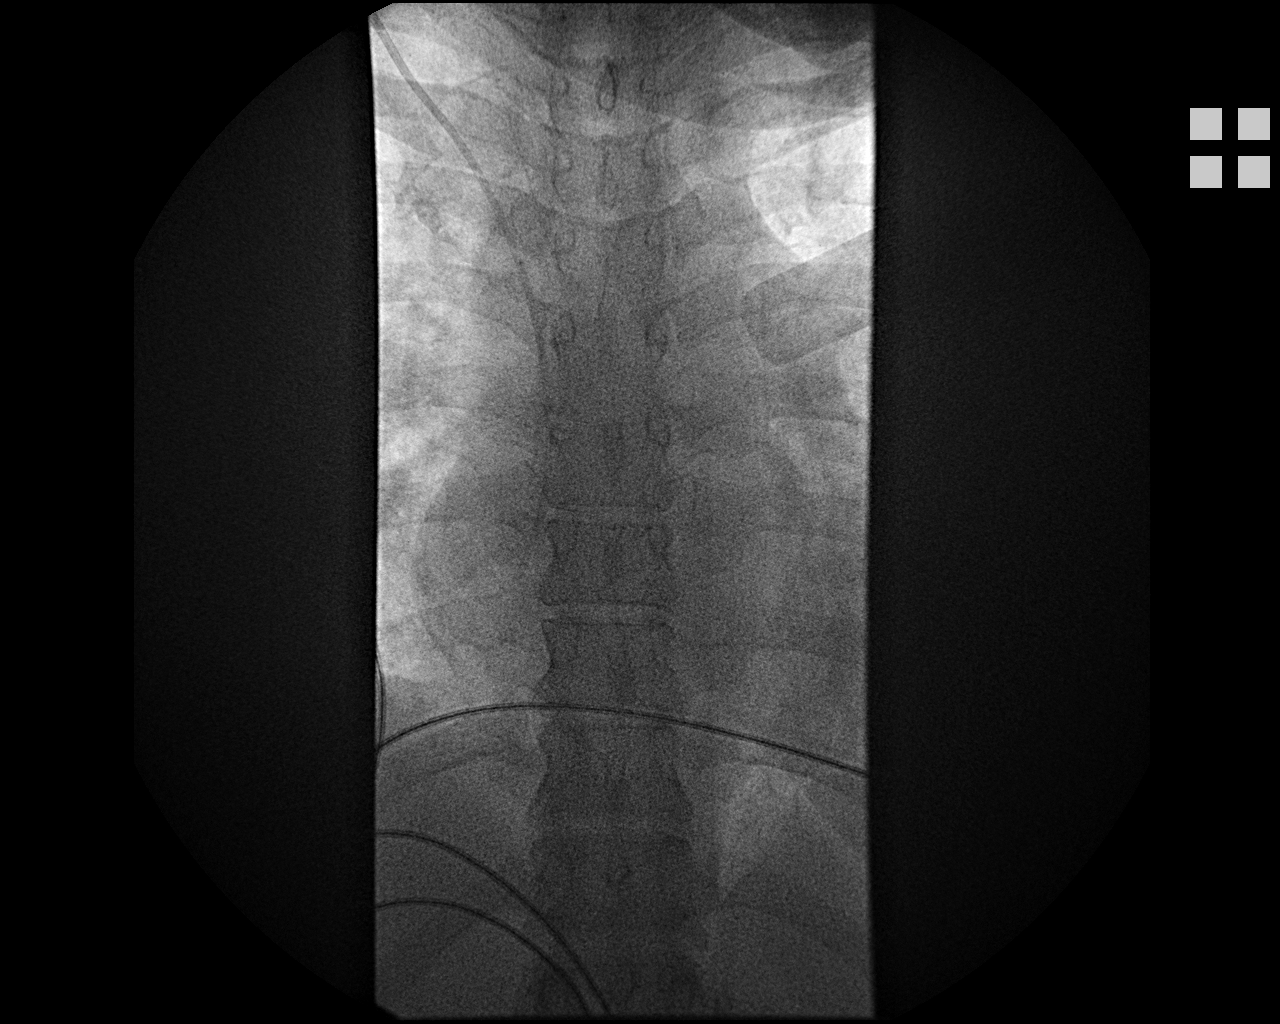
[im 2/2]
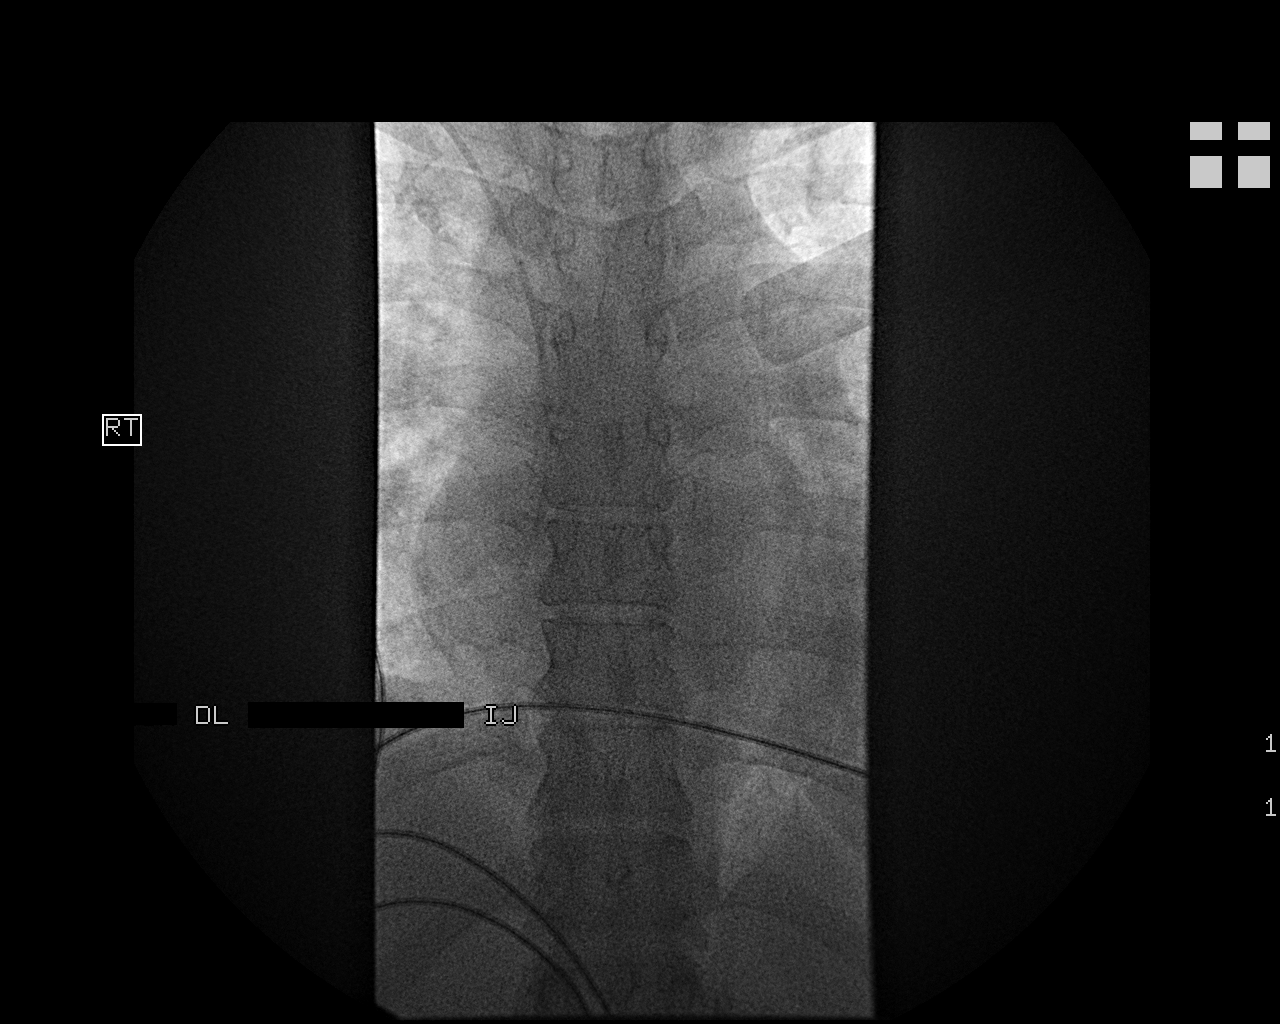

[2 of 2 positions shown; findings below may reference images not displayed]

EXAM:
RIGHT INTERNAL JUGULAR PICC LINE PLACEMENT WITH ULTRASOUND AND
FLUOROSCOPIC GUIDANCE

FLUOROSCOPY TIME:  30 SECONDS

PROCEDURE:
The patient was advised of the possible risks andcomplications and
agreed to undergo the procedure. The patient was then brought to the
angiographic suite for the procedure.

The RIGHT NECK was prepped with chlorhexidine, drapedin the usual
sterile fashion using maximum barrier technique (cap and mask,
sterile gown, sterile gloves, large sterile sheet, hand hygiene and
cutaneous antisepsis) and infiltrated locally with 1% Lidocaine.

Ultrasound demonstrated patency of the right internal jugular vein,
and this was documented with an image. Under real-time ultrasound
guidance, this vein was accessed with a 21 gauge micropuncture
needle and image documentation was performed. A [DATE] wire was
introduced in to the vein. Over this, a 5 French double lumen power
PICC was advanced to the lower SVC/right atrial junction.
Fluoroscopy during the procedure and fluoro spot radiograph confirms
appropriate catheter position. The catheter was flushed and covered
with asterile dressing.

Catheter length: 15

Complications: None immediate
IMPRESSION: Successful right internal jugular double-lumen Power PICC line
placement with ultrasound and fluoroscopic guidance. The catheter is
ready for use.

## 2016-06-07 IMAGING — CT CT HEAD W/O CM
2 series · 15 of 30 positions shown, 19 images · non-contrast
Comparison: Prior MRI from 04/18/2014.

CLINICAL DATA: Initial evaluation for right-sided weakness and
numbness. History of prior stroke.

EXAM:
CT HEAD WITHOUT CONTRAST
TECHNIQUE: Contiguous axial images were obtained from the base of the skull
through the vertex without intravenous contrast.

[Series 201: head w/o, idose (1) · axial · non-contrast · 0.49mm/px · z∈[+291,+411]mm · 13 of 28 slices shown, 17 images]
[im 2/28  brain]
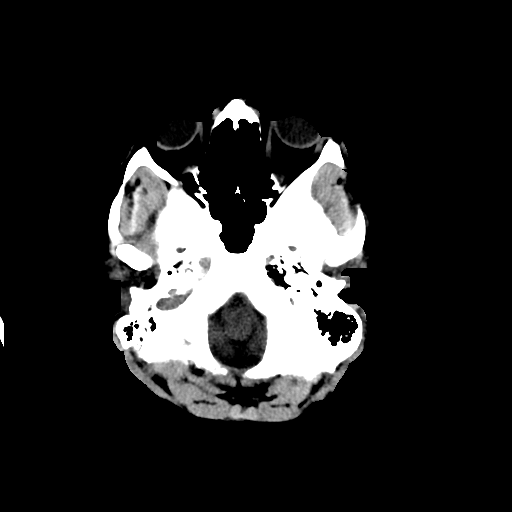
[im 2/28  bone]
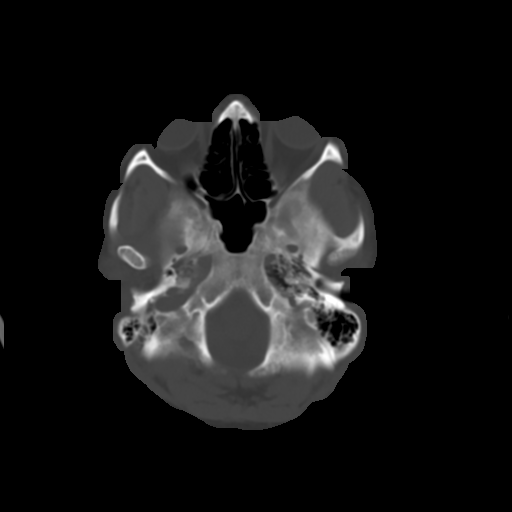
[im 4/28  brain]
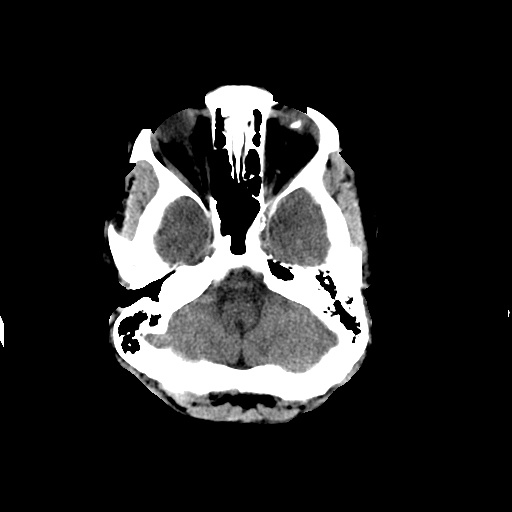
[im 6/28  brain]
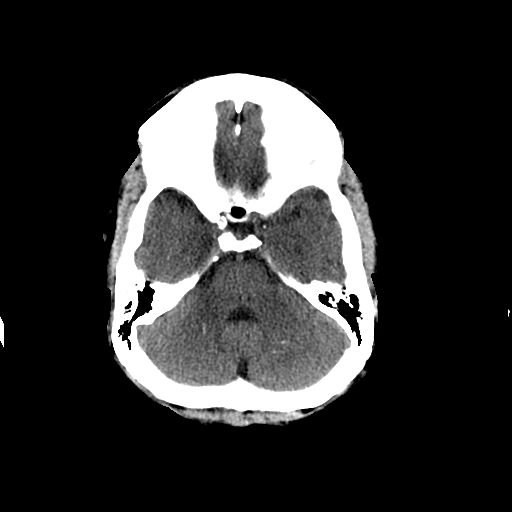
[im 8/28  brain]
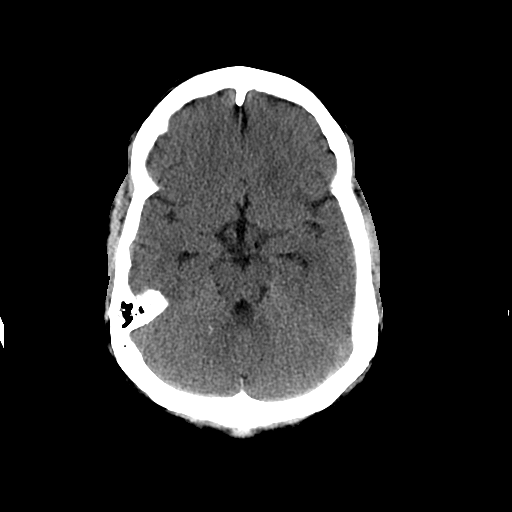
[im 10/28  brain]
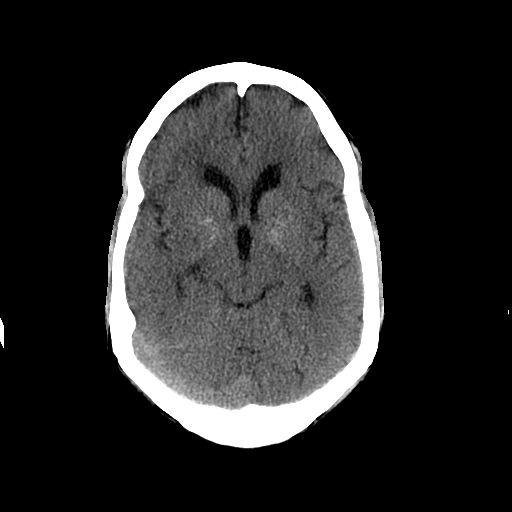
[im 10/28  bone]
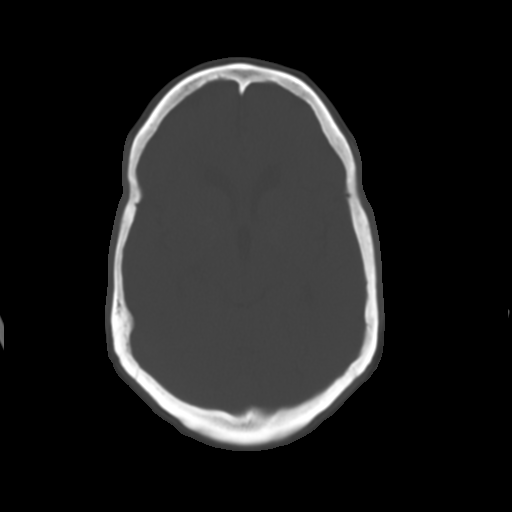
[im 12/28  brain]
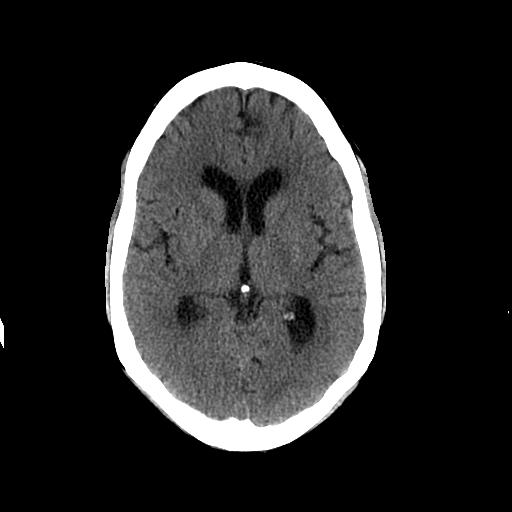
[im 14/28  brain]
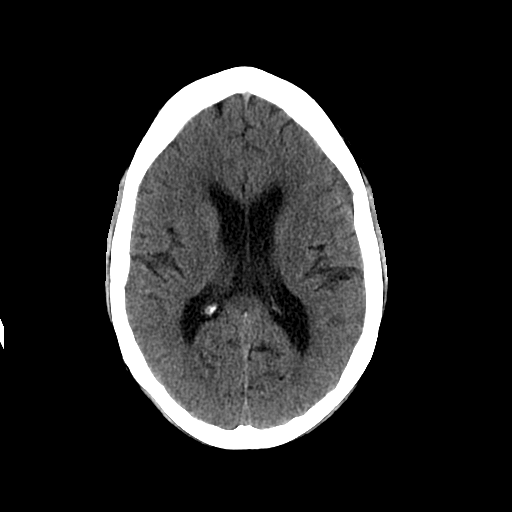
[im 16/28  brain]
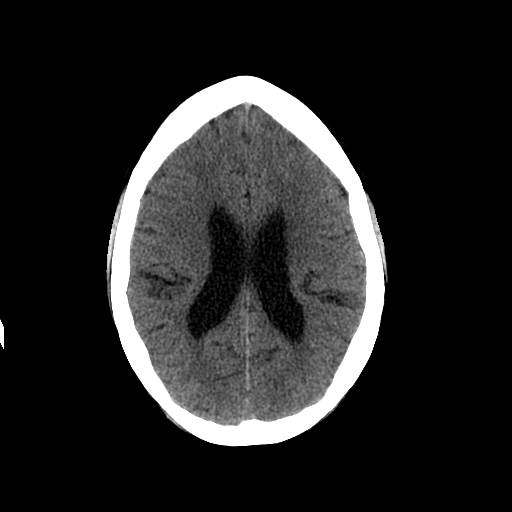
[im 18/28  brain]
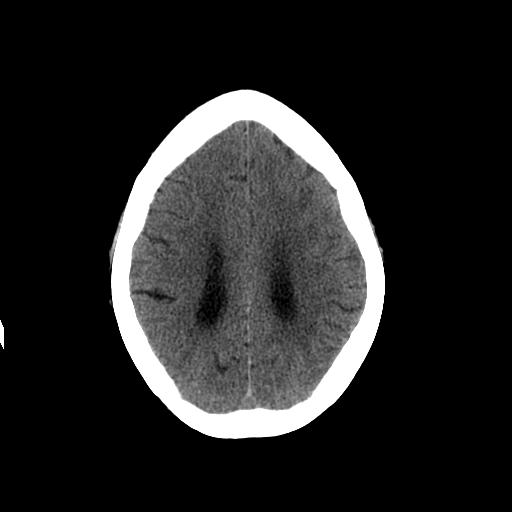
[im 18/28  bone]
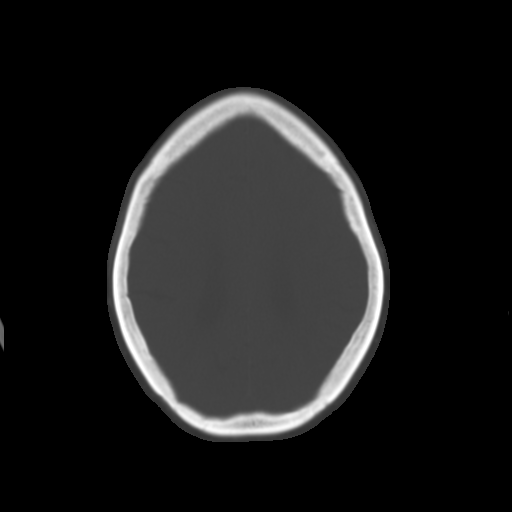
[im 20/28  brain]
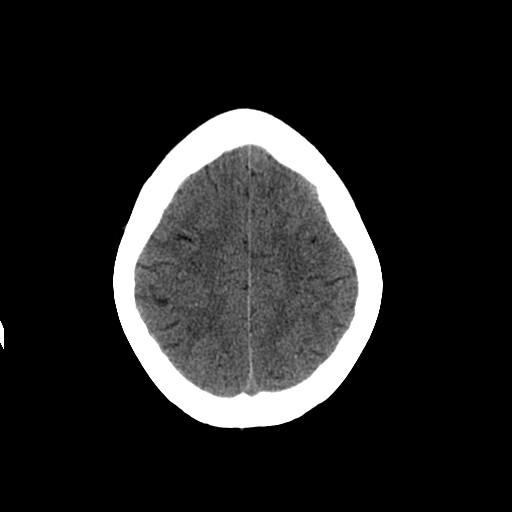
[im 22/28  brain]
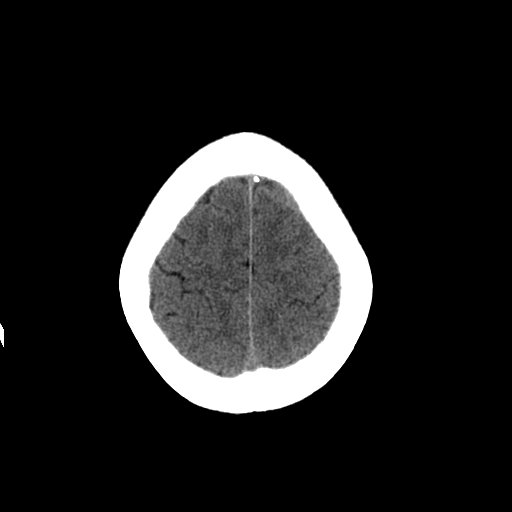
[im 24/28  brain]
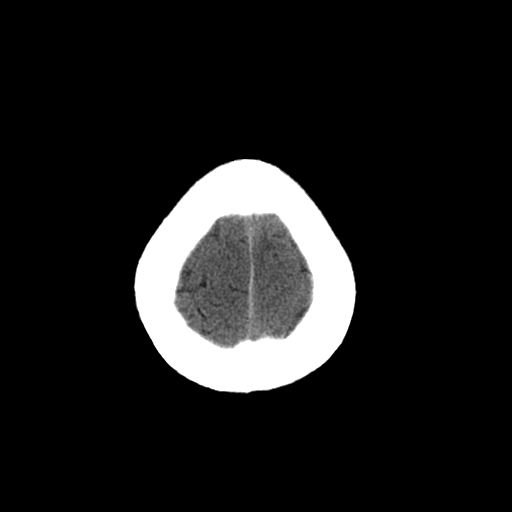
[im 26/28  brain]
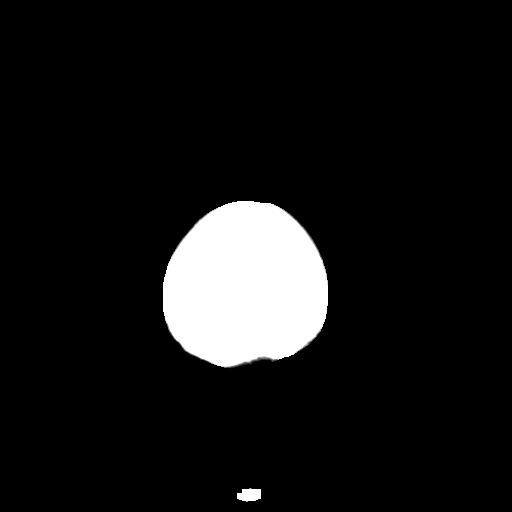
[im 26/28  bone]
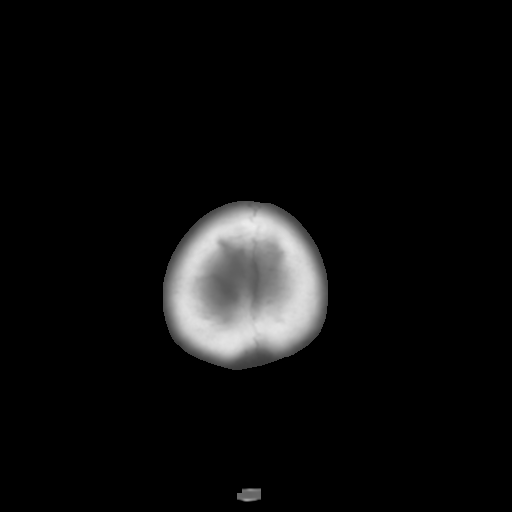

[Series 202: head w/o bone, idose (1) · axial · non-contrast · 0.49mm/px · z∈[+291,+311]mm · 2 of 28 slices shown]
[im 2/28  bone]
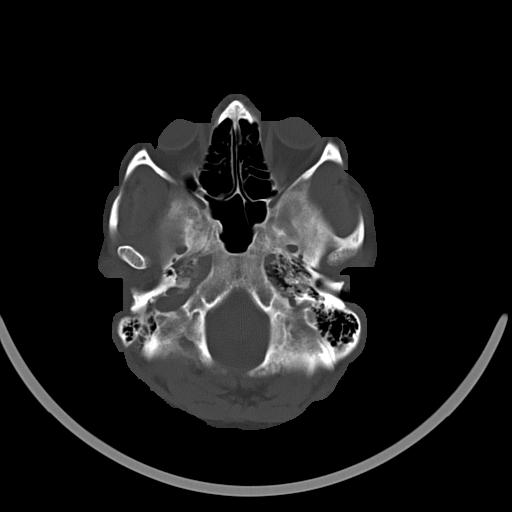
[im 6/28  bone]
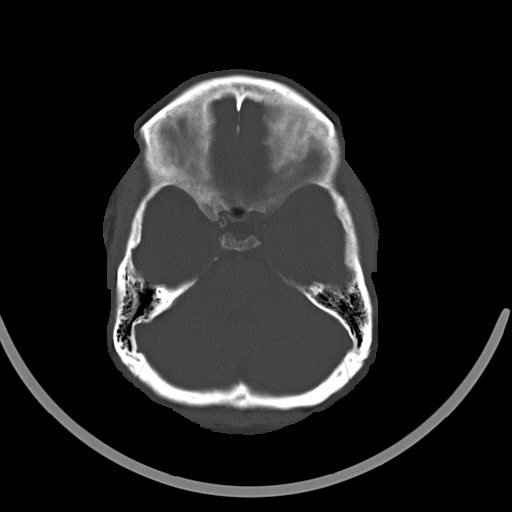

[15 of 30 positions shown; findings below may reference images not displayed]

FINDINGS: Cerebral volume within normal limits for patient age. Patchy
hypodensity within the periventricular and deep white matter both
cerebral hemispheres consistent with chronic small vessel ischemic
disease.

No acute cortically based or large vessel territory infarct. Remote
lacunar infarct present within the left basal ganglia. Basal ganglia
calcifications noted. No intracranial hemorrhage.

No mass lesion or midline shift. No hydrocephalus. No extra-axial
fluid collection.

Calvarium intact.

Scalp soft tissues within normal limits. No acute abnormality seen
about the orbits. Calcification at the superior left orbit is
stable.

Paranasal sinuses and mastoid air cells are clear.
IMPRESSION: 1. No acute intracranial hemorrhage or large vessel territory
infarct.
2. Chronic small vessel ischemic disease with remote lacunar infarct
within the left basal ganglia.

## 2016-06-08 IMAGING — MR MR HEAD W/O CM
9 of 10 series · 36 of 48 positions shown · non-contrast
Comparison: 05/17/2014 head CT.  04/18/2014 brain MR.

CLINICAL DATA: 43-year-old diabetic female with end-stage renal
disease. Recent stroke with residual right-sided weakness.
Presenting to emergency room with persistently elevated blood
sugars. Subsequent encounter.

EXAM:
MRI HEAD WITHOUT CONTRAST
TECHNIQUE: Multiplanar, multiecho pulse sequences of the brain and surrounding
structures were obtained without intravenous contrast.

[Series 3: T1 · sagittal · 5.0mm · 0.47mm/px · 2 of 23 slices shown]
[im 1/23]
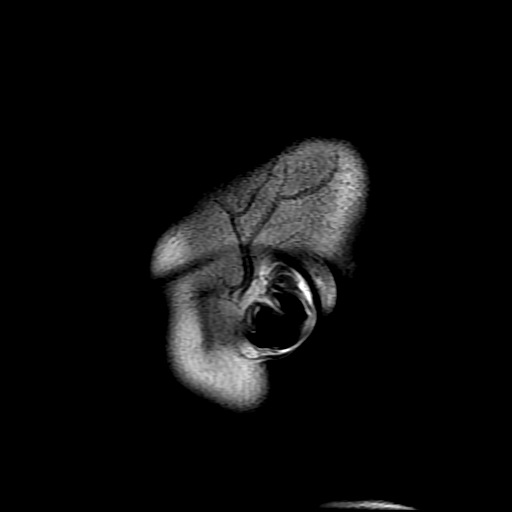
[im 23/23]
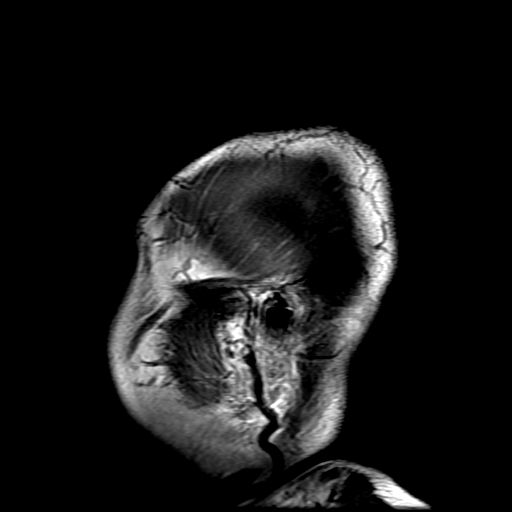

[Series 4: DWI · axial · 3.0mm · 1.09mm/px · z∈[-73,+75]mm · 8 of 102 slices shown (1 of 4)]
[im 1/102]
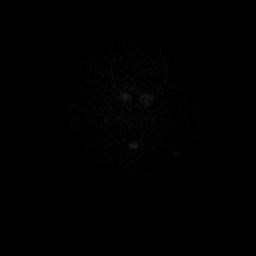
[im 12/102]
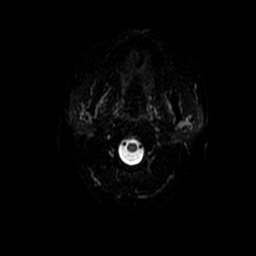
[im 34/102]
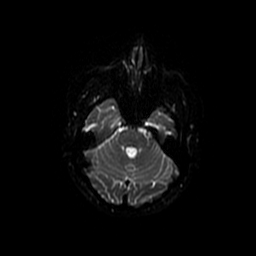
[im 45/102]
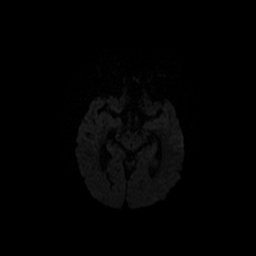
[im 57/102]
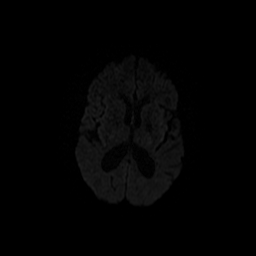
[im 68/102]
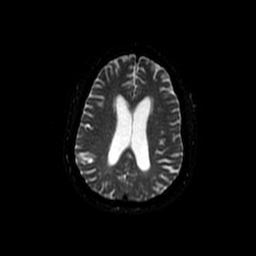
[im 90/102]
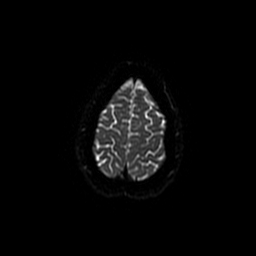
[im 102/102]
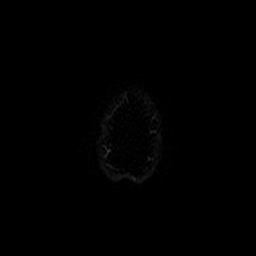

[Series 5: T2 · axial · 5.0mm · 0.43mm/px · z∈[-72,+82]mm · 3 of 27 slices shown (1 of 2)]
[im 1/27]
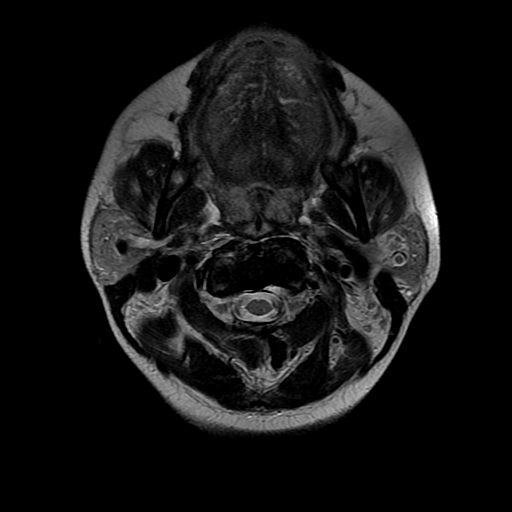
[im 14/27]
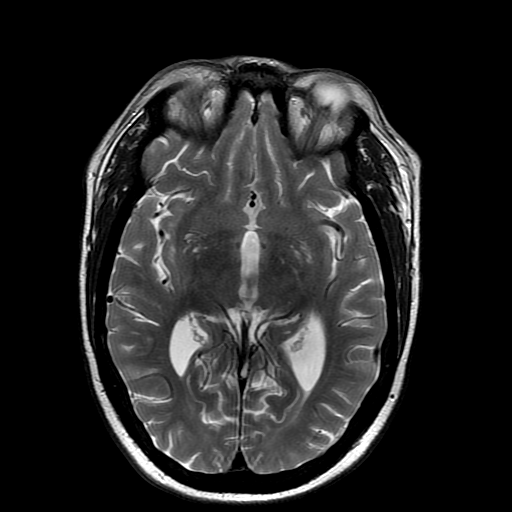
[im 27/27]
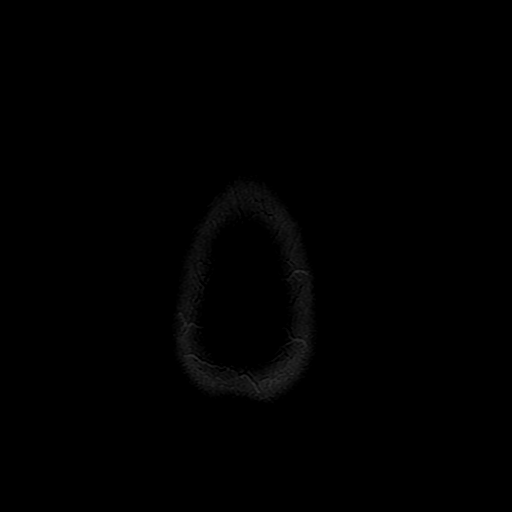

[Series 6: DWI · coronal · 5.0mm · 1.09mm/px · 7 of 72 slices shown (2 of 4)]
[im 1/72]
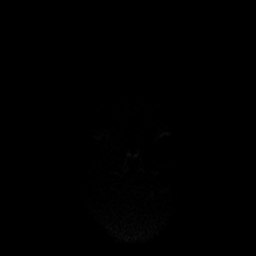
[im 12/72]
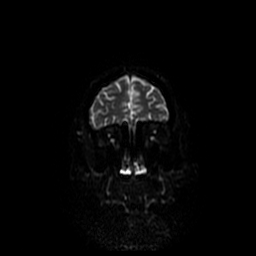
[im 24/72]
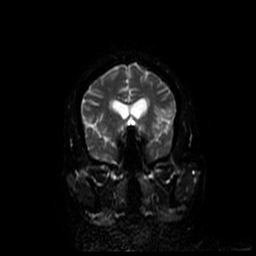
[im 36/72]
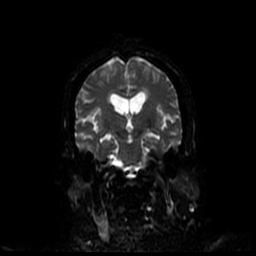
[im 48/72]
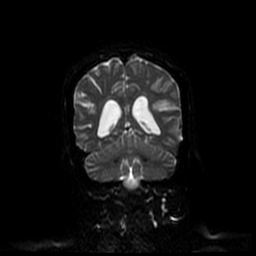
[im 60/72]
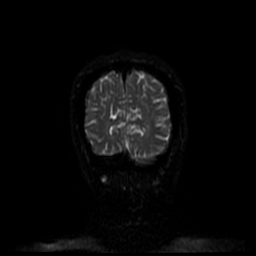
[im 72/72]
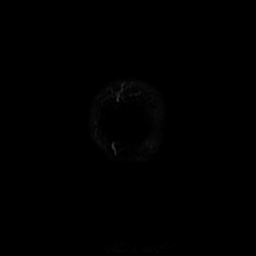

[Series 7: FLAIR · axial · 5.0mm · 0.43mm/px · z∈[-63,+82]mm · 2 of 22 slices shown]
[im 1/22]
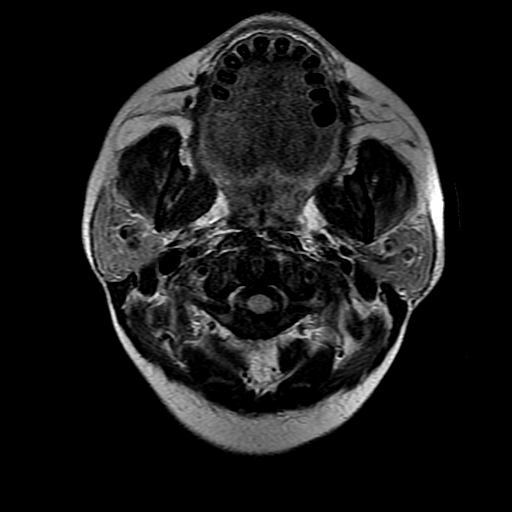
[im 22/22]
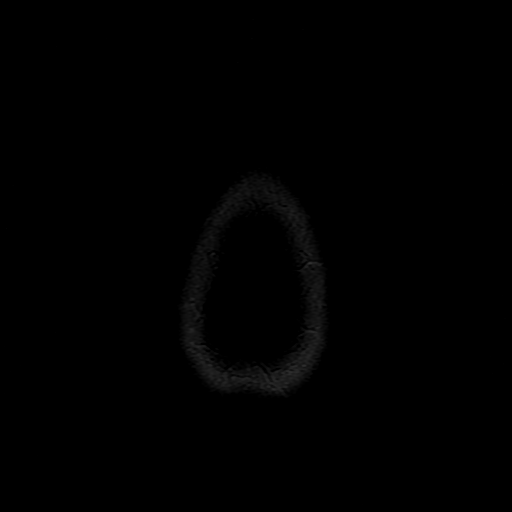

[Series 8: ax mpgr · axial · 5.0mm · 0.43mm/px · z∈[-65,+81]mm · 2 of 22 slices shown]
[im 1/22]
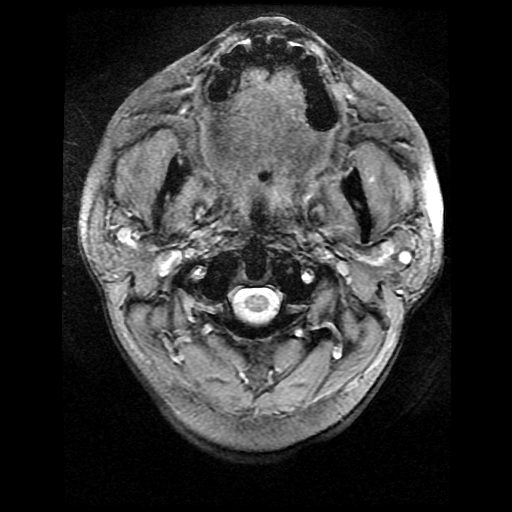
[im 22/22]
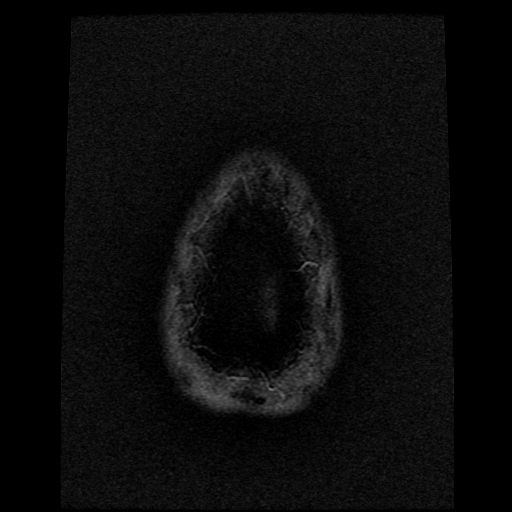

[Series 10: T2 · coronal · 5.0mm · 0.39mm/px · 3 of 27 slices shown (2 of 2)]
[im 1/27]
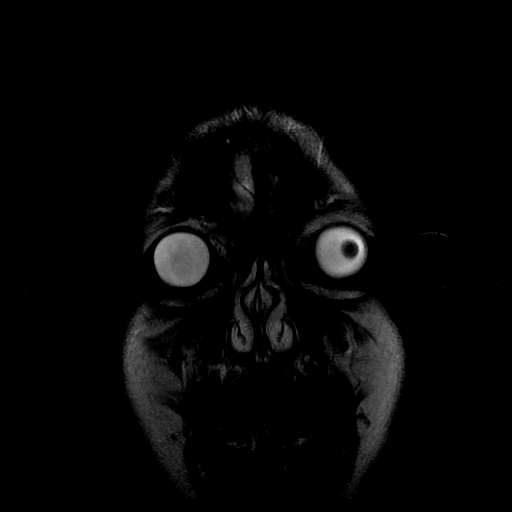
[im 14/27]
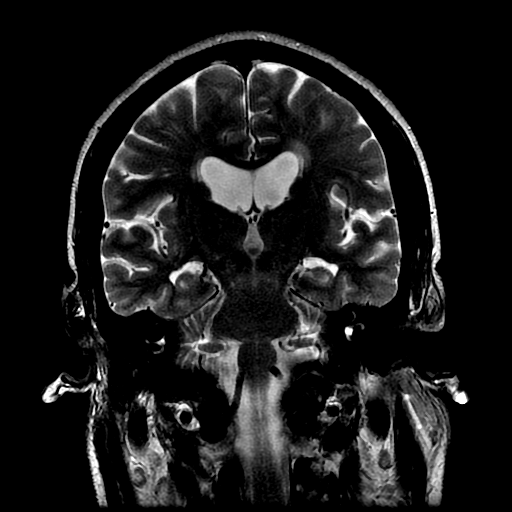
[im 27/27]
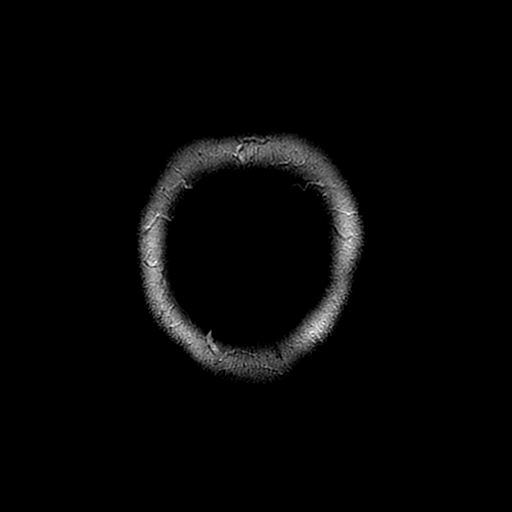

[Series 400: DWI · axial · 3.0mm · 1.09mm/px · z∈[-73,+75]mm · 5 of 51 slices shown (3 of 4)]
[im 1/51]
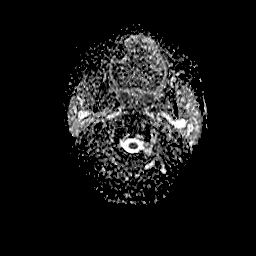
[im 13/51]
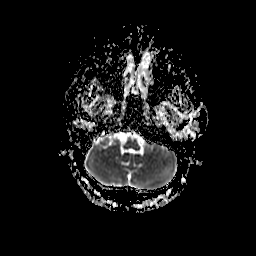
[im 26/51]
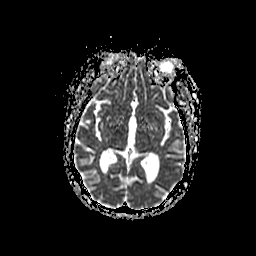
[im 38/51]
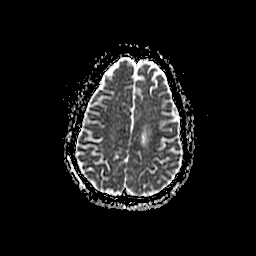
[im 51/51]
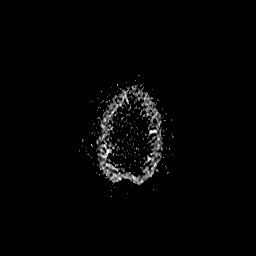

[Series 600: DWI · coronal · 5.0mm · 1.09mm/px · 4 of 36 slices shown (4 of 4)]
[im 1/36]
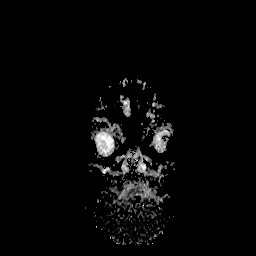
[im 12/36]
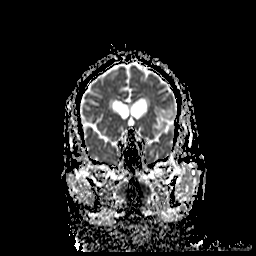
[im 24/36]
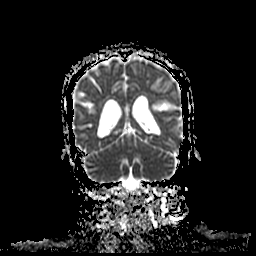
[im 36/36]
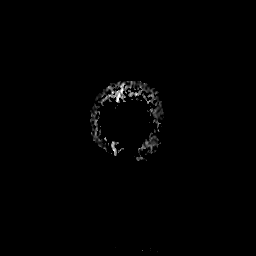

[36 of 48 positions shown; findings below may reference images not displayed]

FINDINGS: No acute infarct.

Recent left dorsal pons infarct not well delineated on the present
exam.

Remote small left globus pallidus infarct.

Mild small vessel disease type changes.

No intracranial hemorrhage.

Mild atrophy. Mild ventricular prominence probably related to
atrophy rather than hydrocephalus and unchanged.

Major intracranial vascular structures are patent.

Exophthalmos.

Minimal paranasal sinus mucosal thickening.

Decreased signal intensity of bone marrow may be related to changes
of renal disease.

Cervical medullary junction, pineal region and pituitary region
unremarkable.
IMPRESSION: No acute abnormality.  Please see above.

## 2016-08-24 IMAGING — CR DG CHEST 1V PORT
1 series · 1 of 1 positions shown · non-contrast
Comparison: 05/17/2014

CLINICAL DATA: Shortness of breath, fever, vomiting for 1 week.

EXAM:
PORTABLE CHEST - 1 VIEW

[AP]
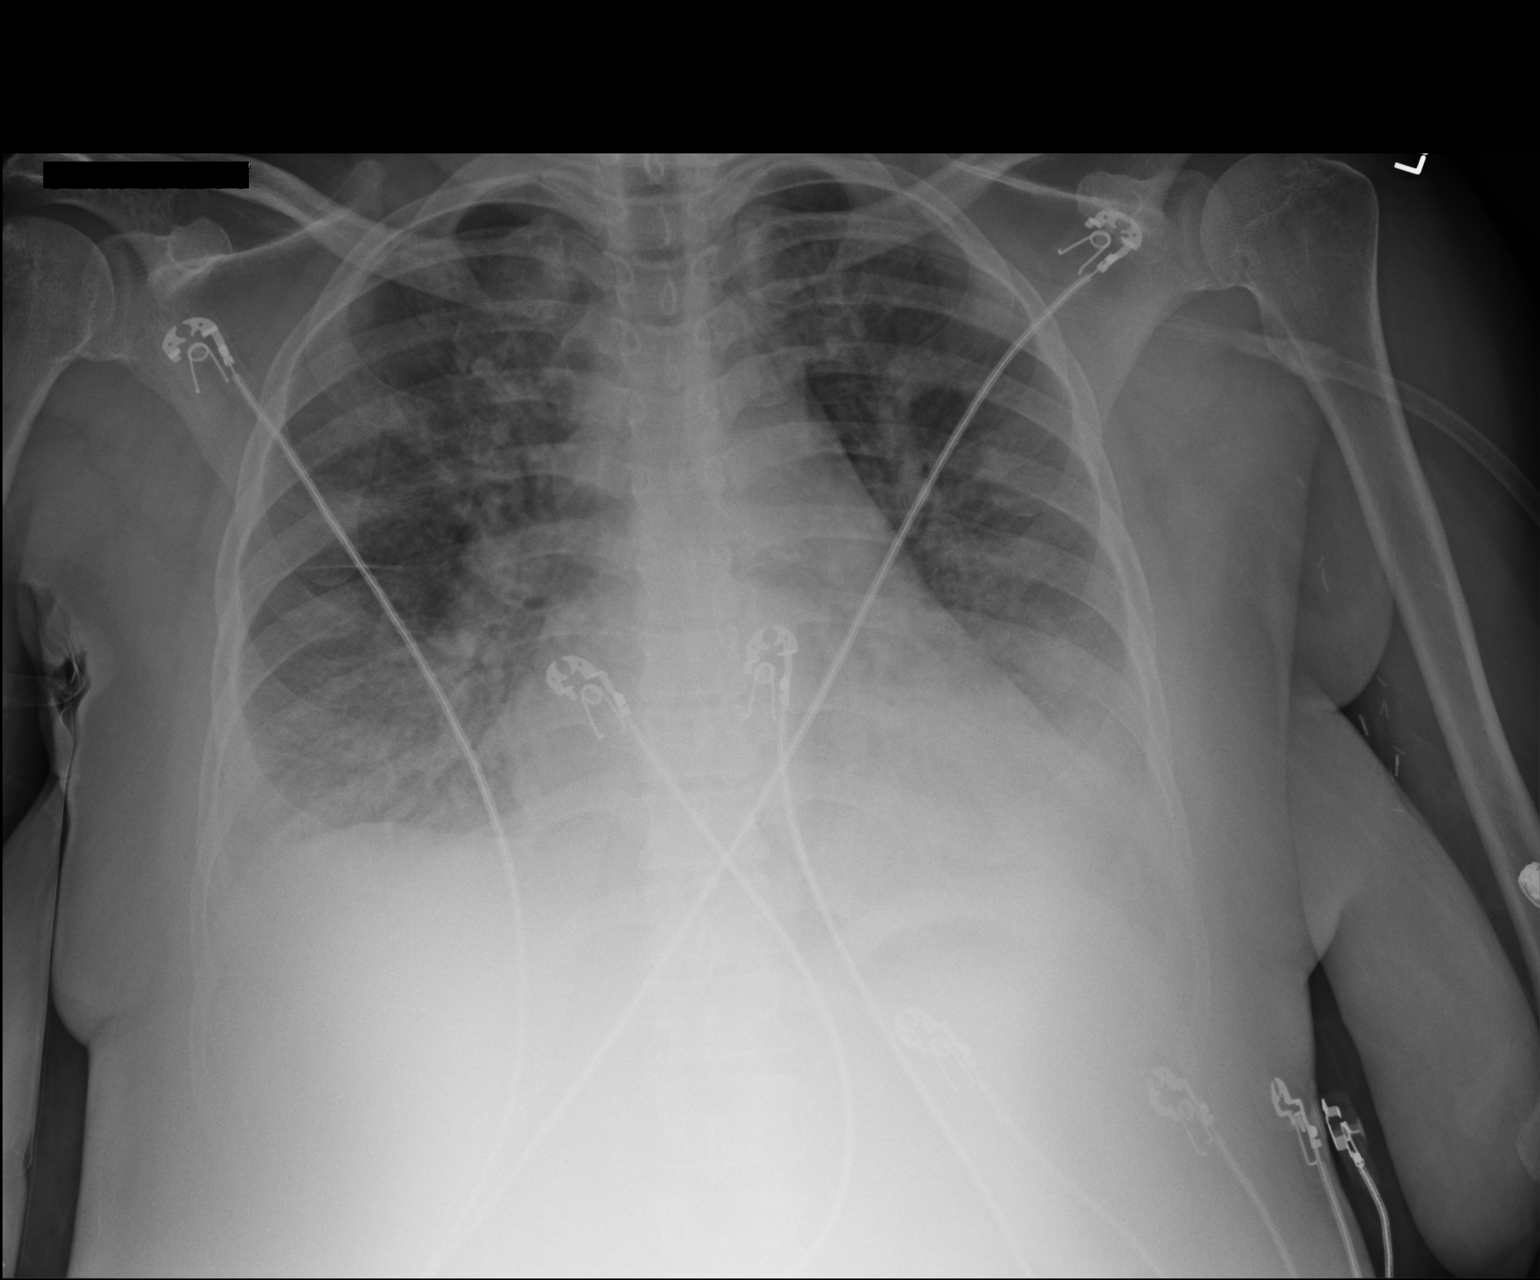

[1 of 1 positions shown; findings below may reference images not displayed]

FINDINGS: Cardiomegaly is stable. There is vascular congestion and pulmonary
edema, this appears similar to prior exam. Suspect small bilateral
pleural effusions with hazy opacity at the lung bases. No
pneumothorax. No acute osseous abnormalities.
IMPRESSION: 1. Cardiomegaly, vascular congestion, pulmonary edema, consistent
with congestive heart failure. This appears similar to prior exam.
2. Increased hazy opacity at the lung bases concerning for pleural
effusions.

## 2016-08-25 IMAGING — CR DG CHEST 2V
2 series · 2 of 2 positions shown · non-contrast
Comparison: 05/17/2014 and 08/03/2014

CLINICAL DATA: Productive cough and shortness of breath. Patient on
dialysis. History of failed kidney/pancreas transplant 7225.

EXAM:
CHEST  2 VIEW

[chest pa]
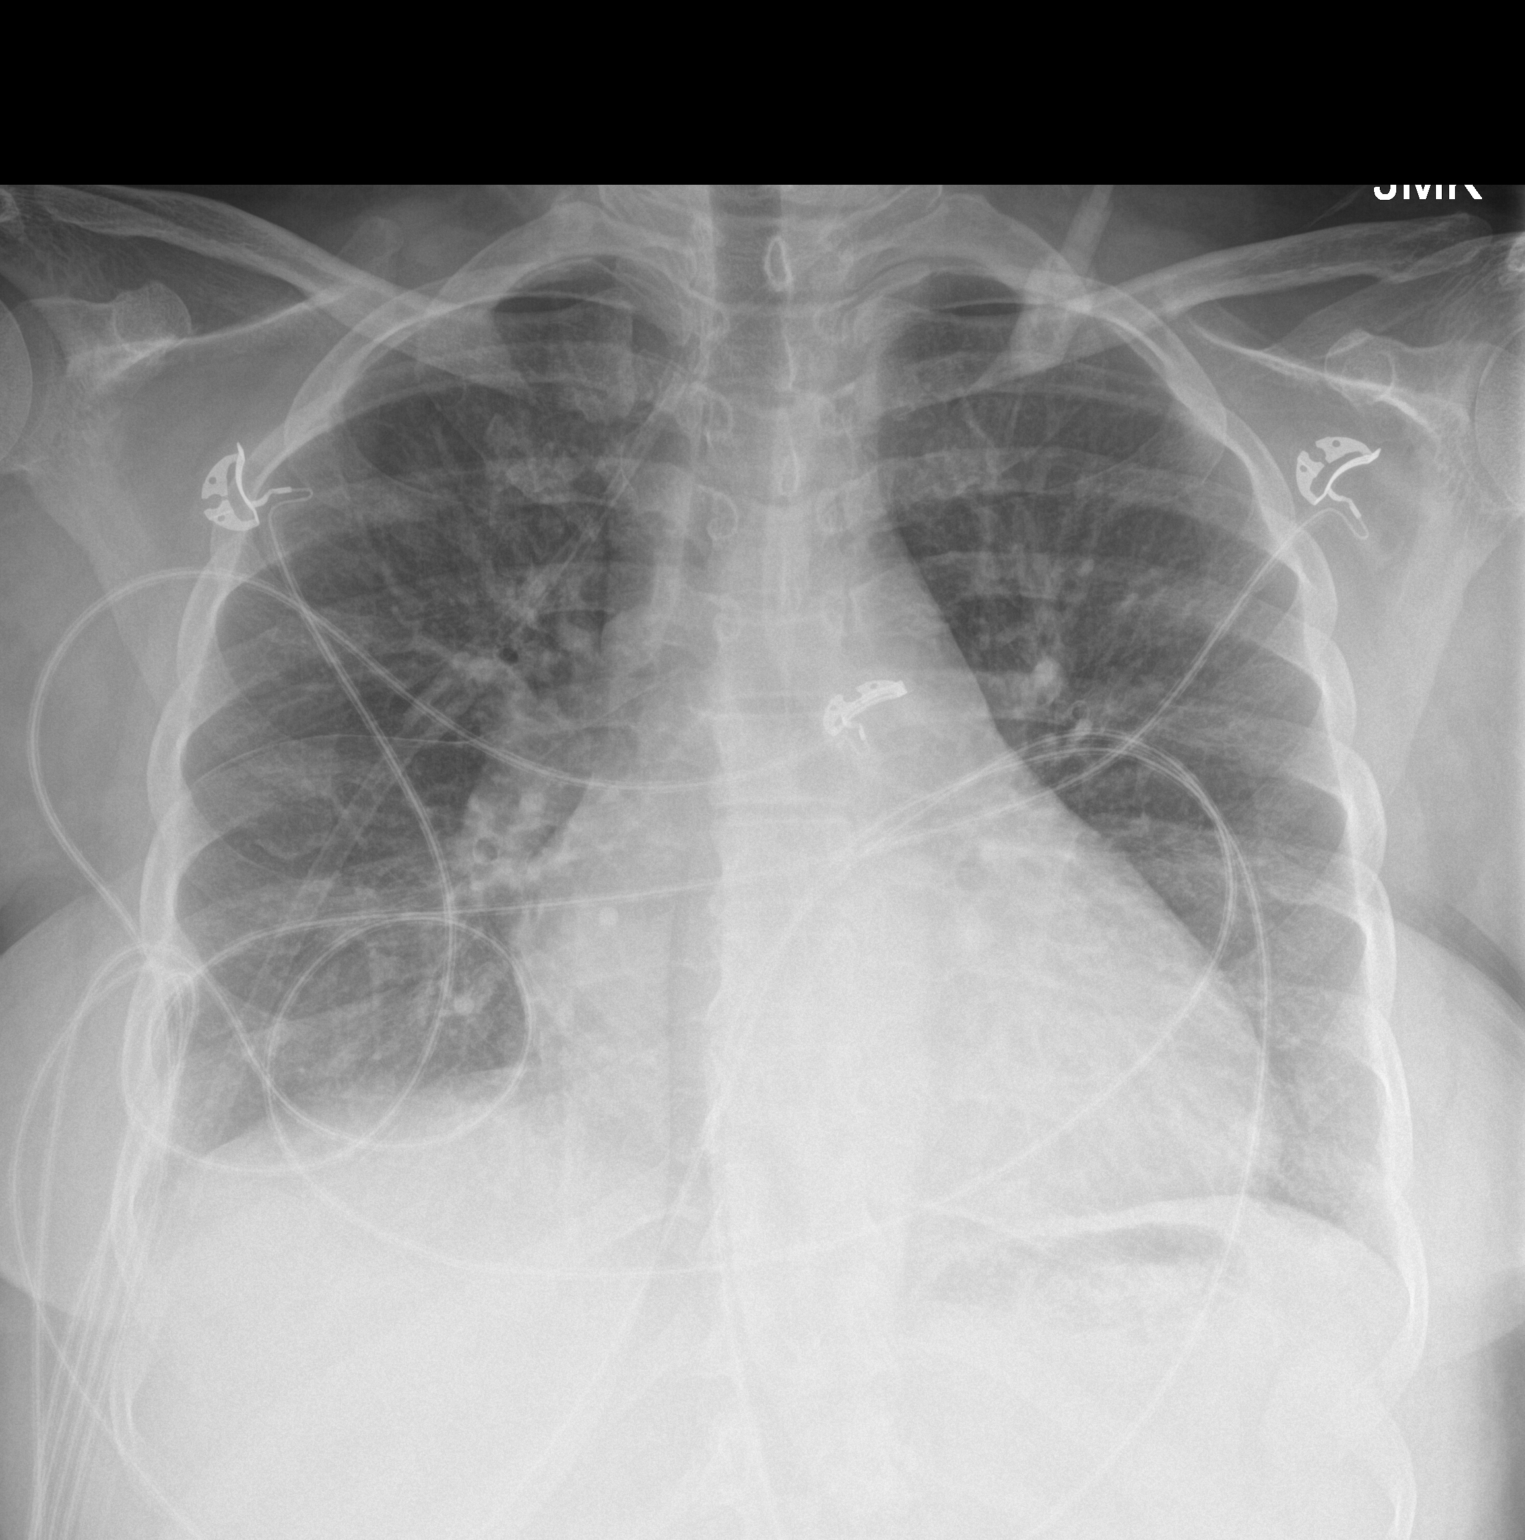

[chest lat]
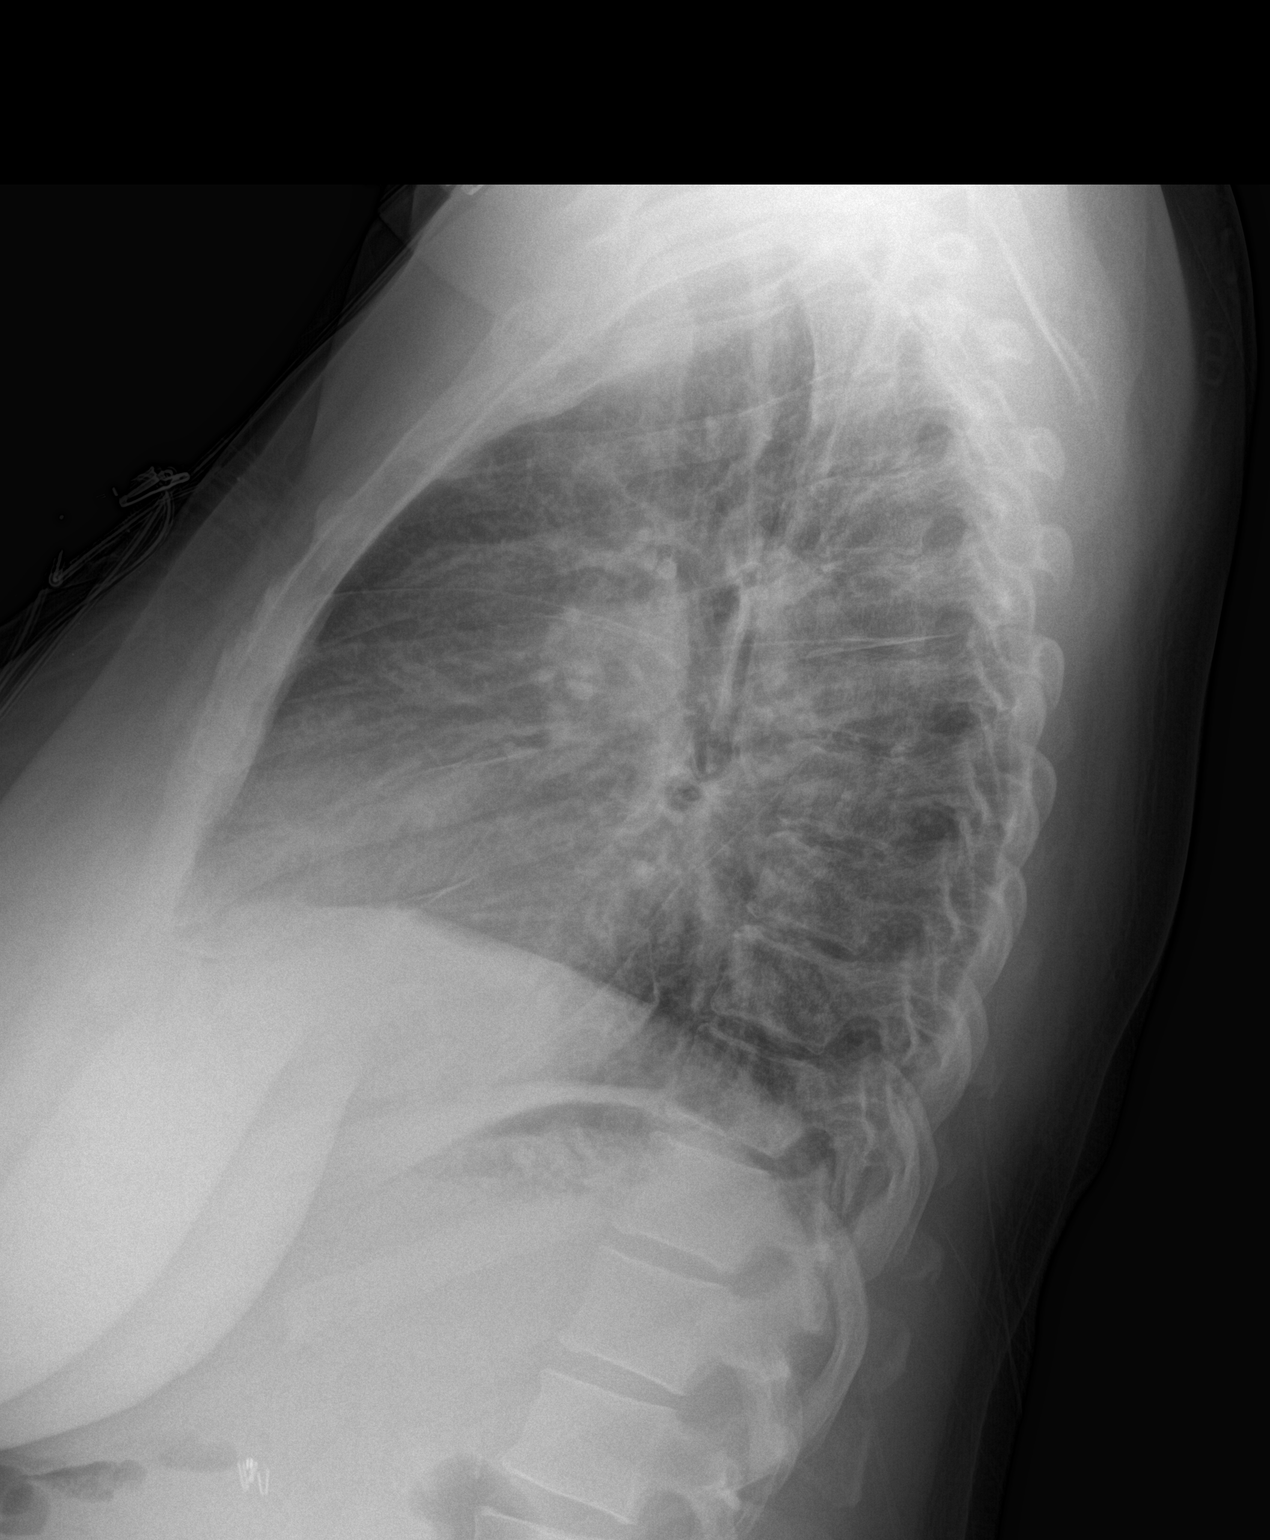

[2 of 2 positions shown; findings below may reference images not displayed]

FINDINGS: Lungs are adequately inflated with mild prominence of the perihilar
markings. There is improved bibasilar aeration. No definite
effusions. Mild stable cardiomegaly. Remainder of the exam is
unchanged.
IMPRESSION: Suggestion of improved mild vascular congestion.

## 2016-10-11 IMAGING — US US BIOPSY
1 series · 13 of 20 positions shown · non-contrast
Comparison: none

CLINICAL DATA: 44-year-old female with a history of head and neck
lymphadenopathy

[Series 1: us biopsy · 0.08mm/px · 13 of 20 slices shown]
[im 1/20]
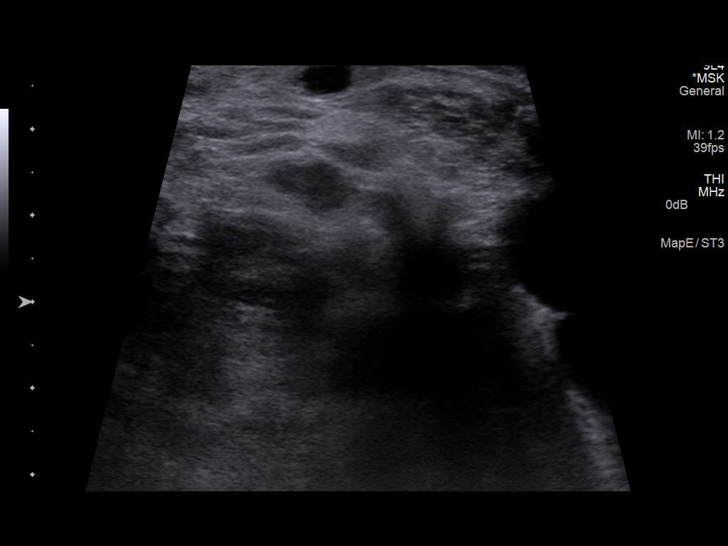
[im 3/20]
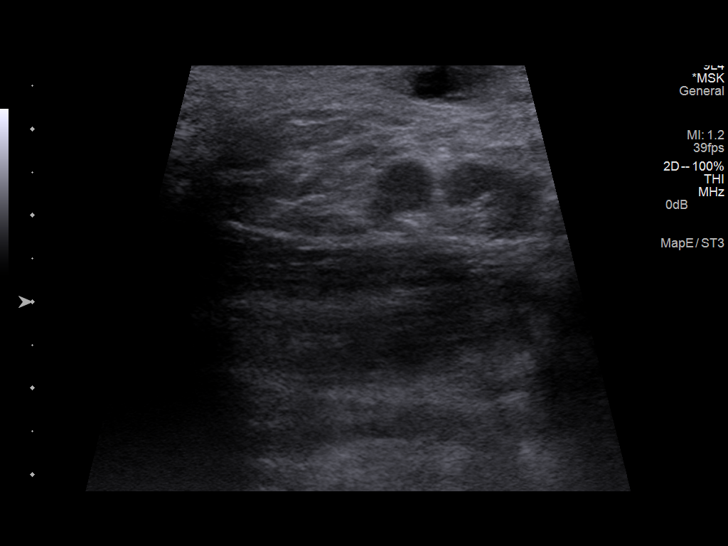
[im 4/20]
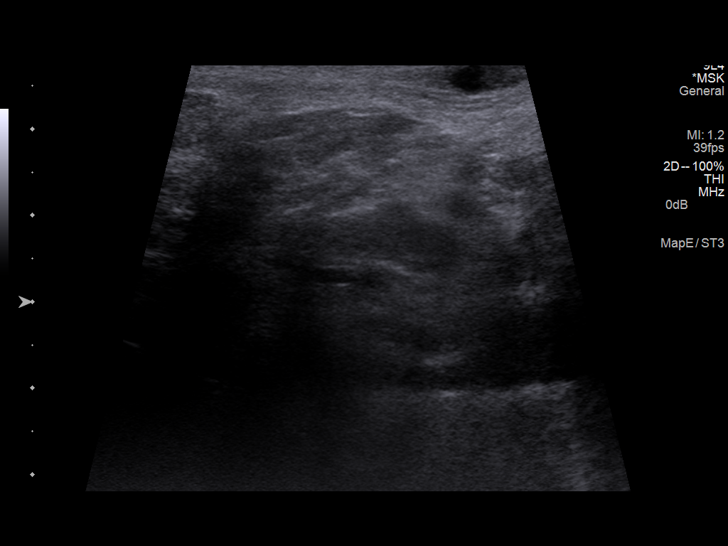
[im 6/20]
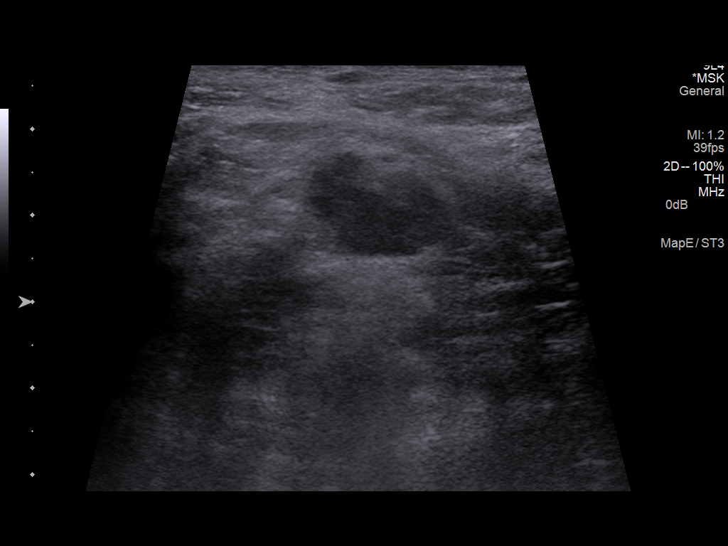
[im 7/20]
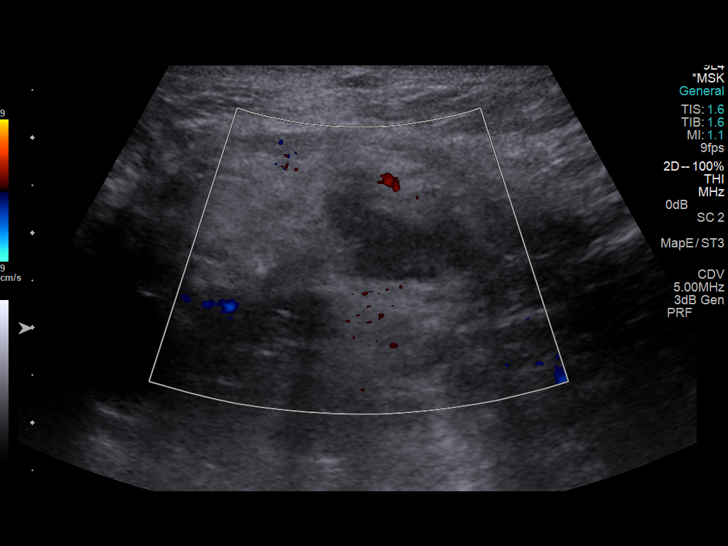
[im 9/20]
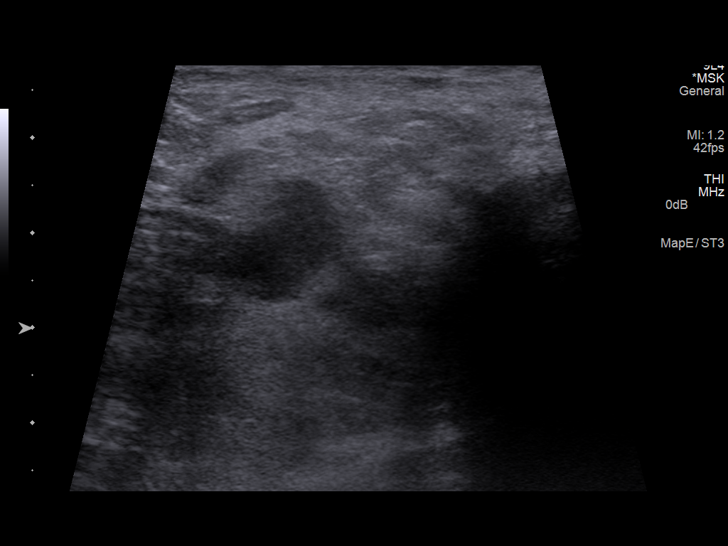
[im 11/20]
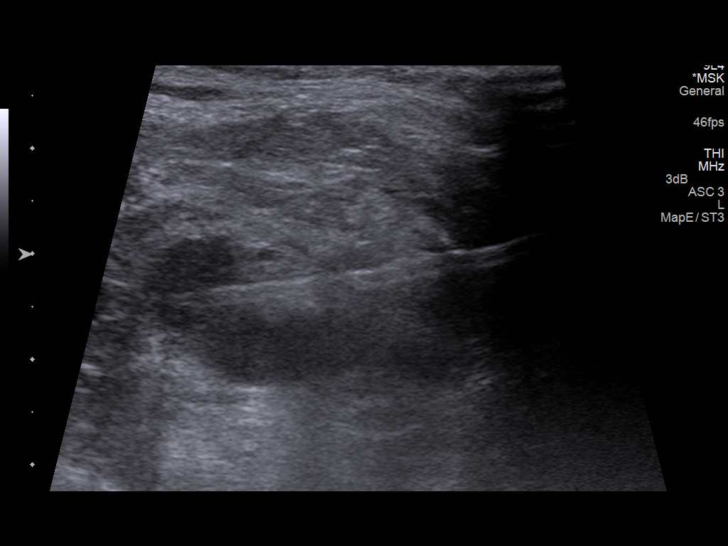
[im 12/20]
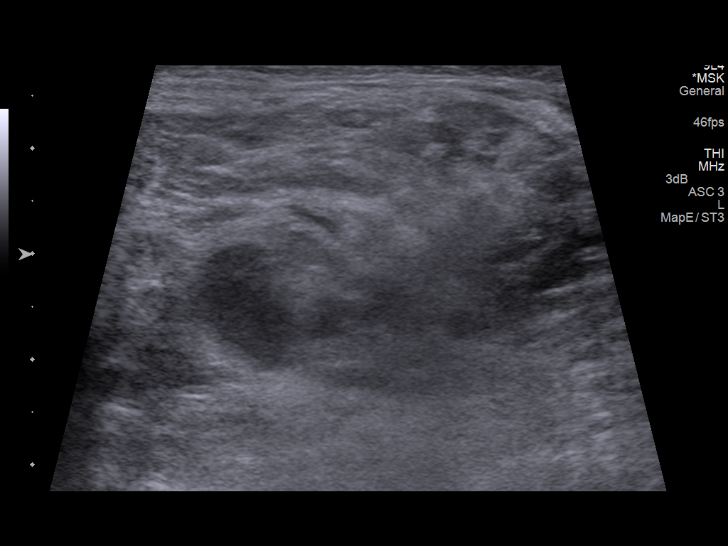
[im 14/20]
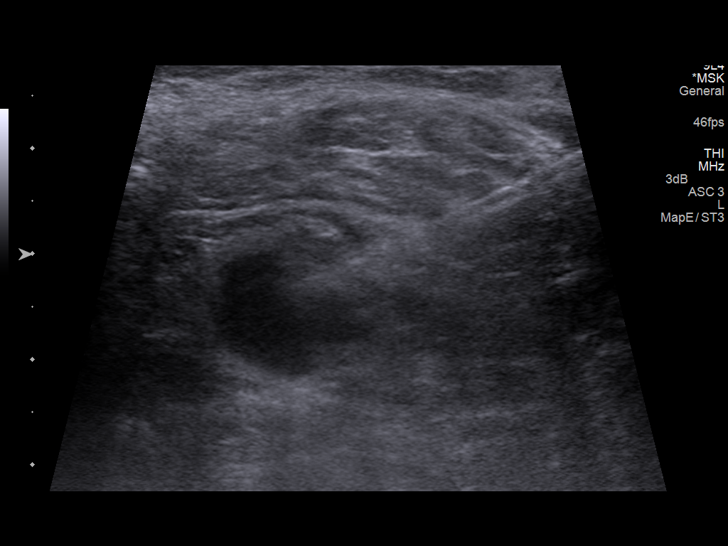
[im 15/20]
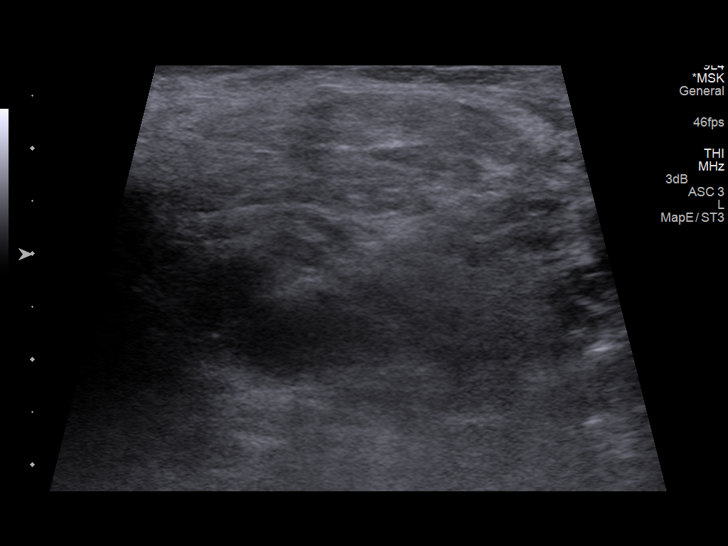
[im 17/20]
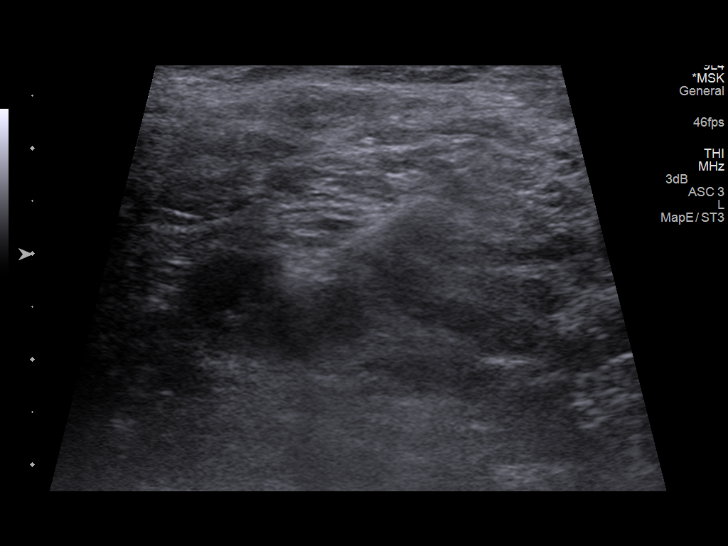
[im 18/20]
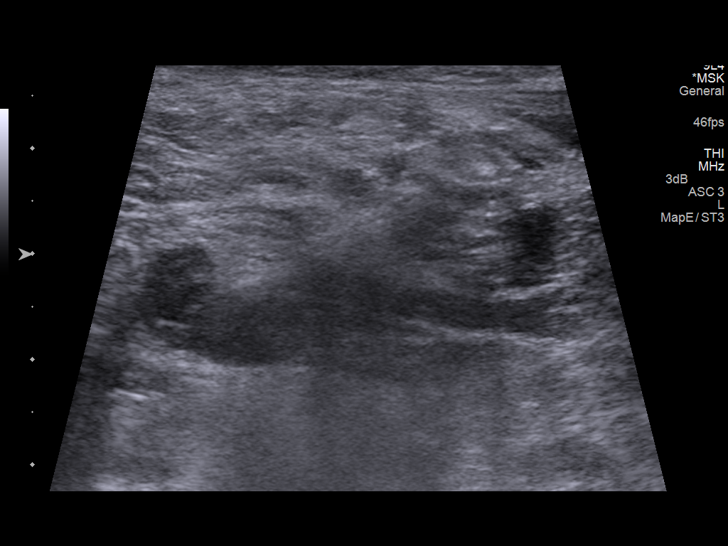
[im 20/20]
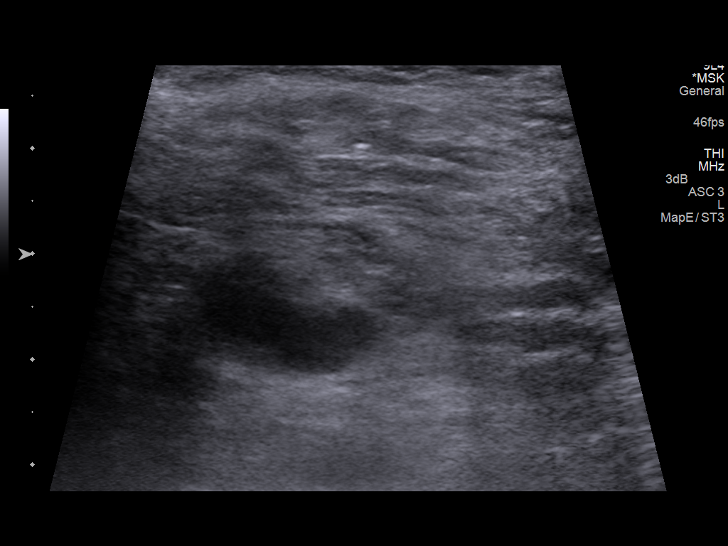

[13 of 20 positions shown; findings below may reference images not displayed]

EXAM:
ULTRASOUND GUIDED CORE BIOPSY OF HEAD NECK LYMPH NODES

MEDICATIONS:
1.0 mg IV Versed; 50 mcg IV Fentanyl

Total Moderate Sedation Time: 20

PROCEDURE:
The procedure, risks, benefits, and alternatives were explained to
the patient. Questions regarding the procedure were encouraged and
answered. The patient understands and consents to the procedure.

Ultrasound survey of the left neck was performed with images stored
and sent to PACs.

The left neck was prepped with Betadine in a sterile fashion, and a
sterile drape was applied covering the operative field. A sterile
gown and sterile gloves were used for the procedure. Local
anesthesia was provided with 1% Lidocaine.

Once the patient is prepped and draped sterilely, the skin and
subcutaneous tissues were generously infiltrated with 1% lidocaine
without epinephrine.

A small stab incision was made, an using ultrasound guidance,
several separate 18 gauge core biopsy were retrieved of enlarged
lymph node of the left cervical region.

Final images demonstrate no complicating features.

Patient tolerated the procedure well and remained hemodynamically
stable throughout.

No complications encountered and no significant blood loss
encountered.

COMPLICATIONS:
None.
FINDINGS: Ultrasound survey demonstrates borderline enlarged lymph node of the
left head and neck, with the usual architecture maintained.

Images during the case demonstrate multiple core biopsy with the tip
of the needle/biopsy device through the node.
IMPRESSION: Status post ultrasound-guided biopsy of left cervical lymph node.
Sample was sent for culture, cytology, and pathology.

## 2016-10-14 IMAGING — US US ABDOMEN COMPLETE
1 series · 13 of 25 positions shown · non-contrast
Comparison: CT abdomen pelvis - 04/11/2015

CLINICAL DATA: Ascites

EXAM:
ULTRASOUND ABDOMEN COMPLETE

[Series 1: us abdomen complete · 0.22mm/px · 13 of 60 slices shown]
[im 1/60]
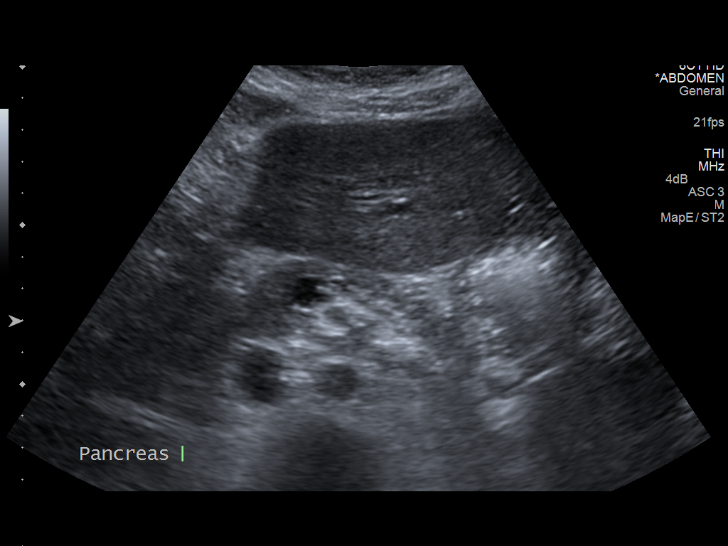
[im 5/60]
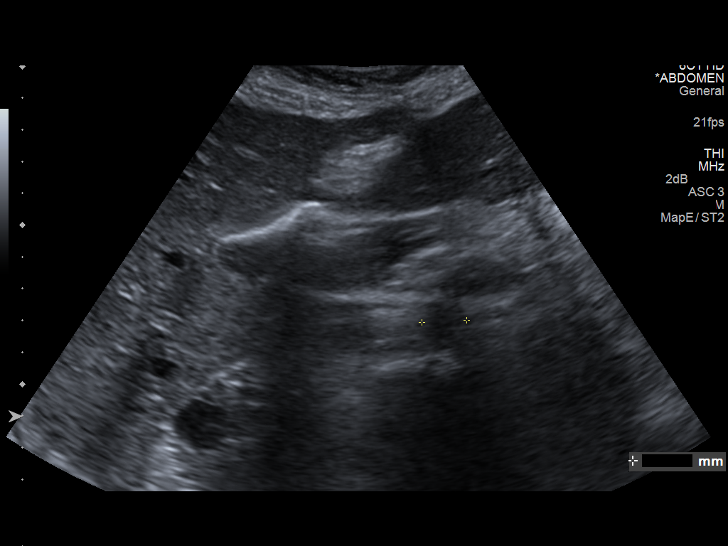
[im 10/60]
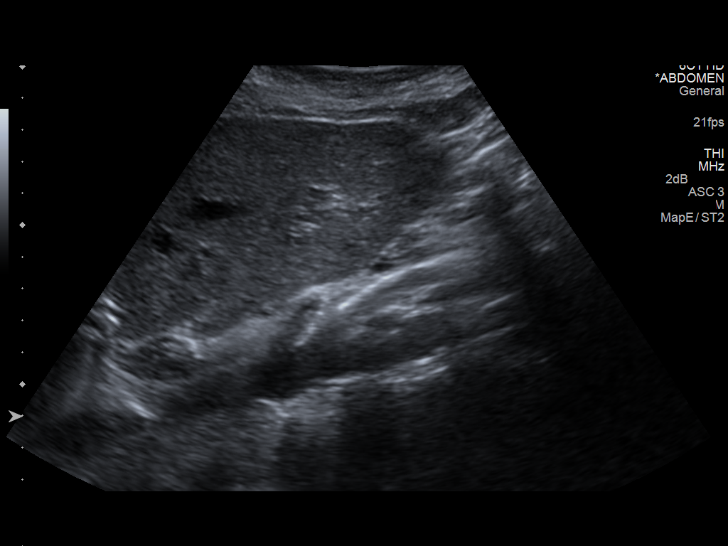
[im 15/60]
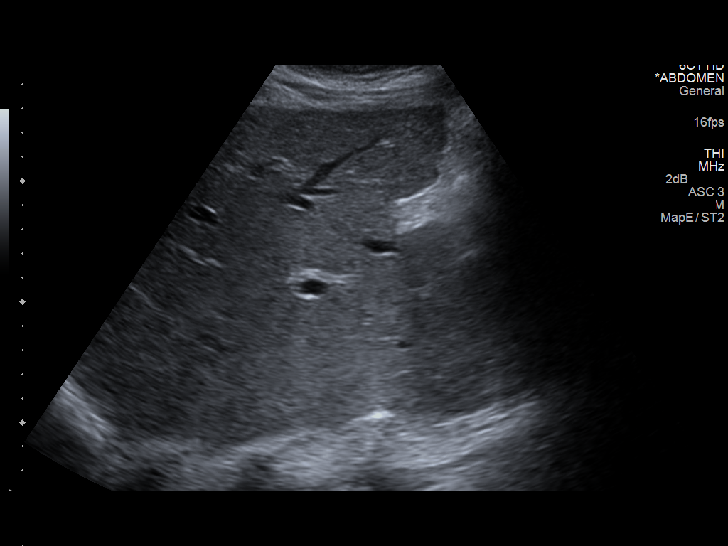
[im 20/60]
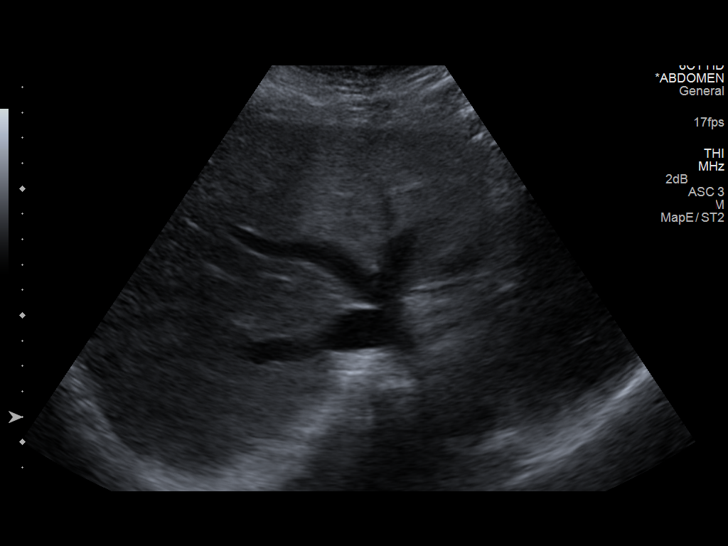
[im 25/60]
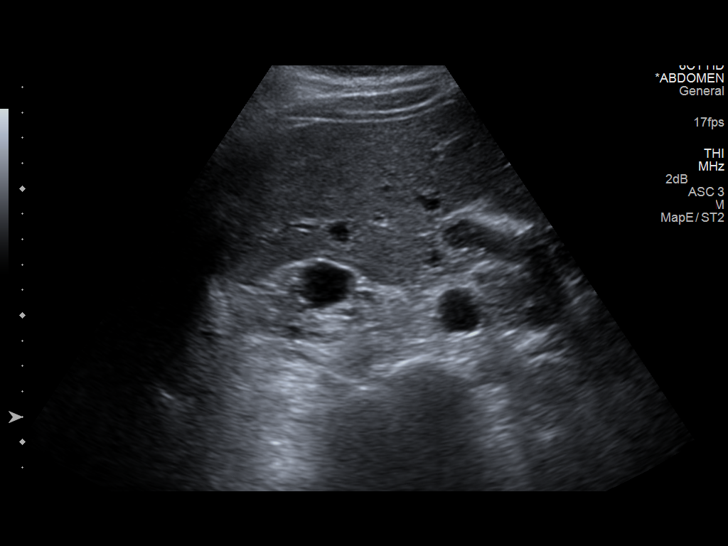
[im 30/60]
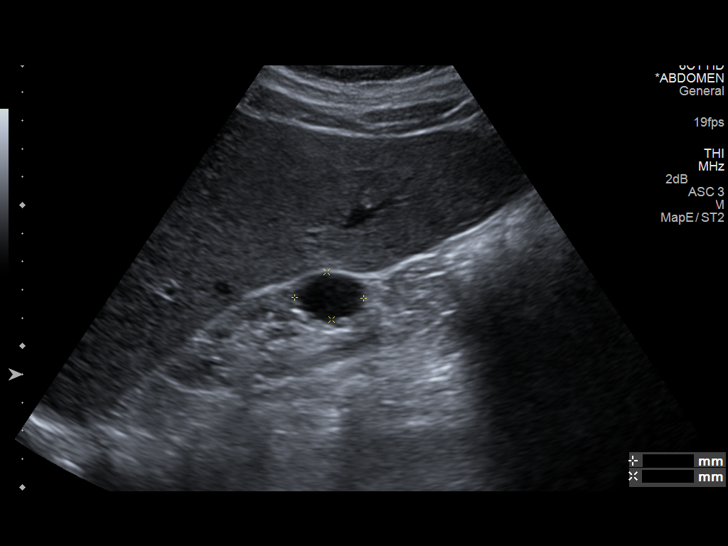
[im 35/60]
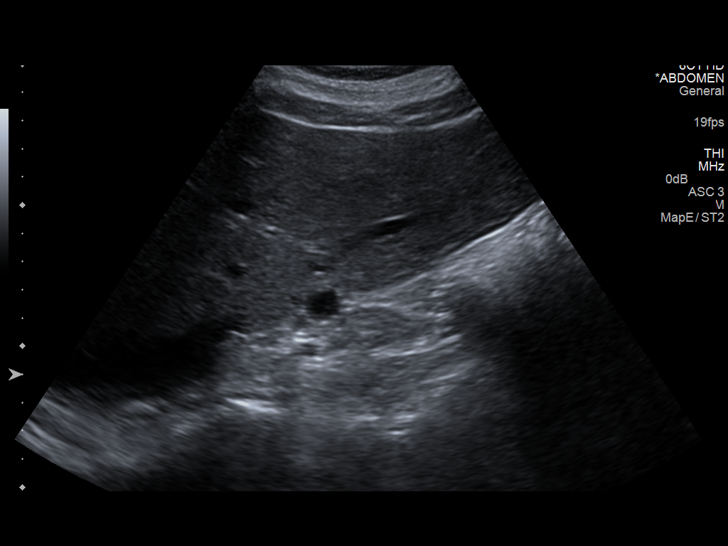
[im 40/60]
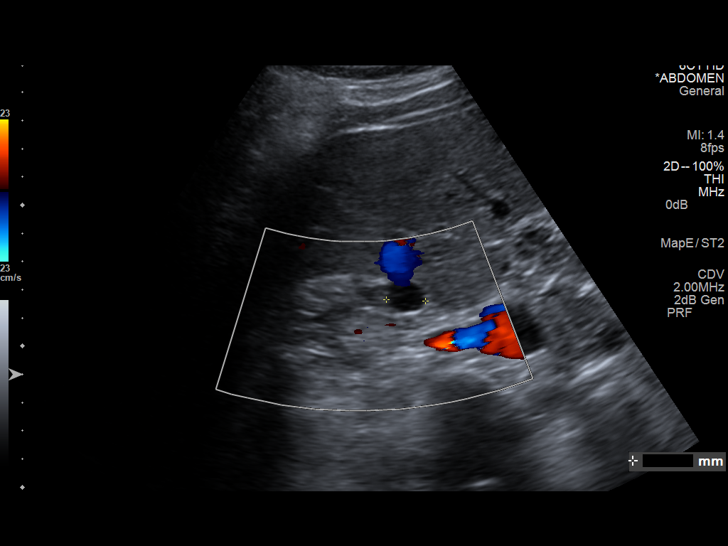
[im 45/60]
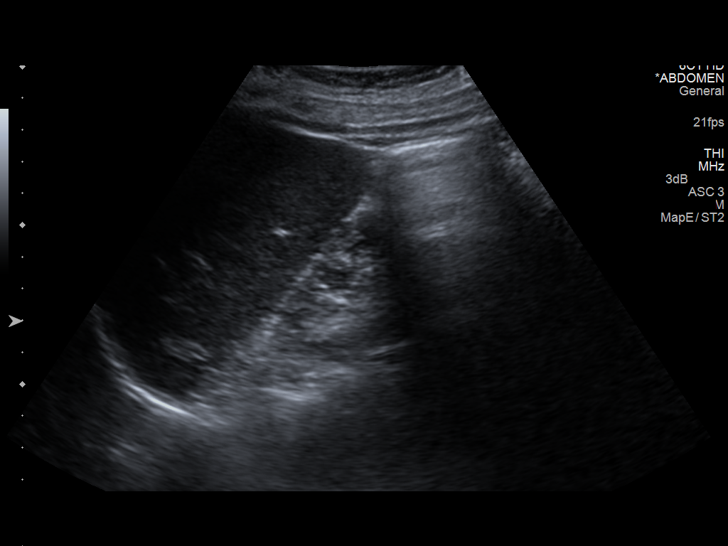
[im 50/60]
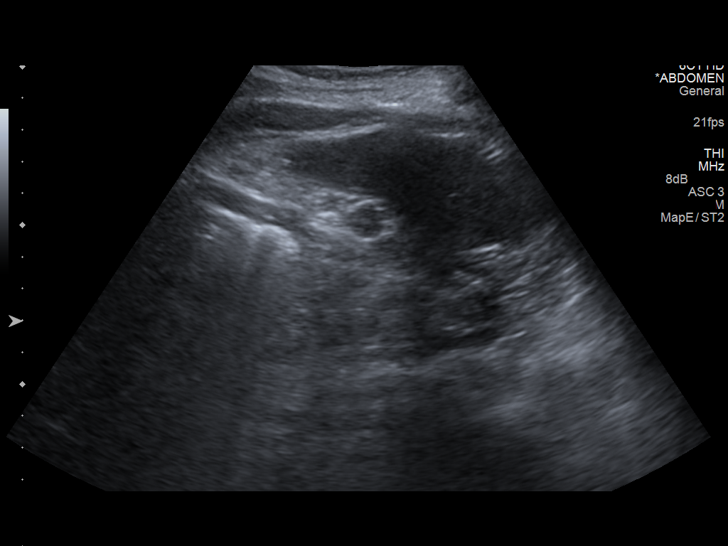
[im 55/60]
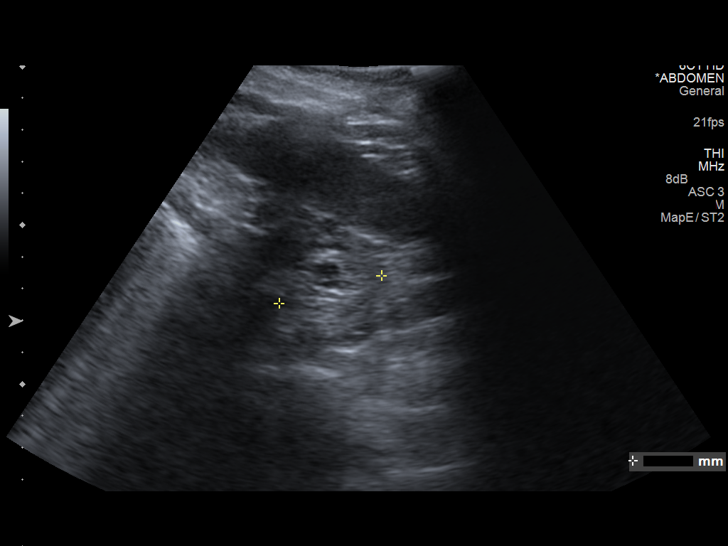
[im 60/60]
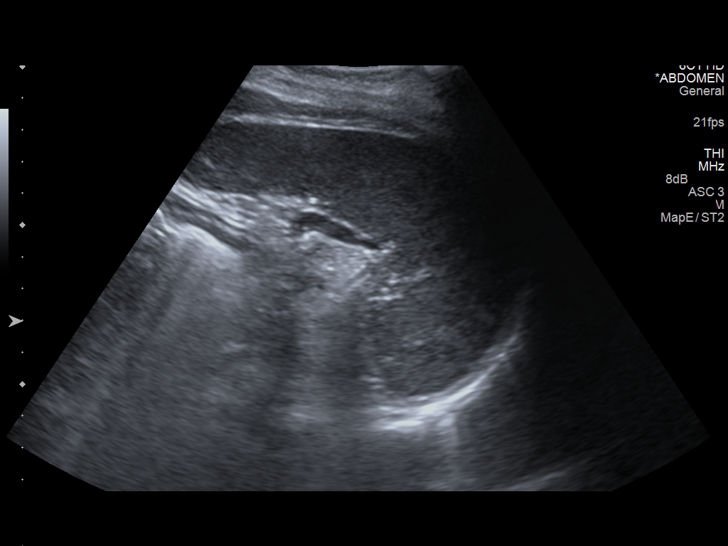

[13 of 25 positions shown; findings below may reference images not displayed]

FINDINGS: Gallbladder: Post cholecystectomy

Common bile duct: Diameter: Normal in size measuring 3.9 mm in
diameter

Liver: Homogeneous hepatic echotexture. No discrete hepatic lesions.
No definite evidence of intrahepatic biliary ductal dilatation. No
ascites.

IVC: No abnormality visualized.

Pancreas: Limited visualization of the pancreatic head and neck is
normal. Visualization of the pancreatic body and tail is obscured by
bowel gas.

Spleen: Normal in size measuring 11 cm in length

Right Kidney: The right kidney is slightly atrophic measuring only
9.4 cm in length. There is diffuse renal cortical thinning with
associated increased renal cortical echogenicity. There is an
approximately 2.5 x 1.7 x 1.9 cm anechoic cyst arising mid aspect of
the right kidney as well as an adjacent approximately 1.2 x 1.2 x
1.4 cm anechoic cyst within the mid/inferior aspect of the right
kidney. No evidence of right-sided urinary obstruction.

Left Kidney: Obscured by colonic interposition and patient body
habitus.

Abdominal aorta: No aneurysm visualized.

Other findings: None.
IMPRESSION: 1. Interval resolution of previously noted trace amount of
perihepatic ascites.
2. The right kidney appears atrophic with diffuse increased renal
cortical echogenicity, nonspecific though could be seen in the
setting of medical renal disease - clinical correlation is advised.
No evidence of right-sided urinary obstruction.
3. Nonvisualization of the left kidney secondary to colonic
interposition and patient body habitus.

## 2016-12-01 IMAGING — CR DG CHEST 1V PORT
1 series · 1 of 1 positions shown · non-contrast
Comparison: 08/04/2014

CLINICAL DATA: Dyspnea

EXAM:
PORTABLE CHEST - 1 VIEW

[AP]
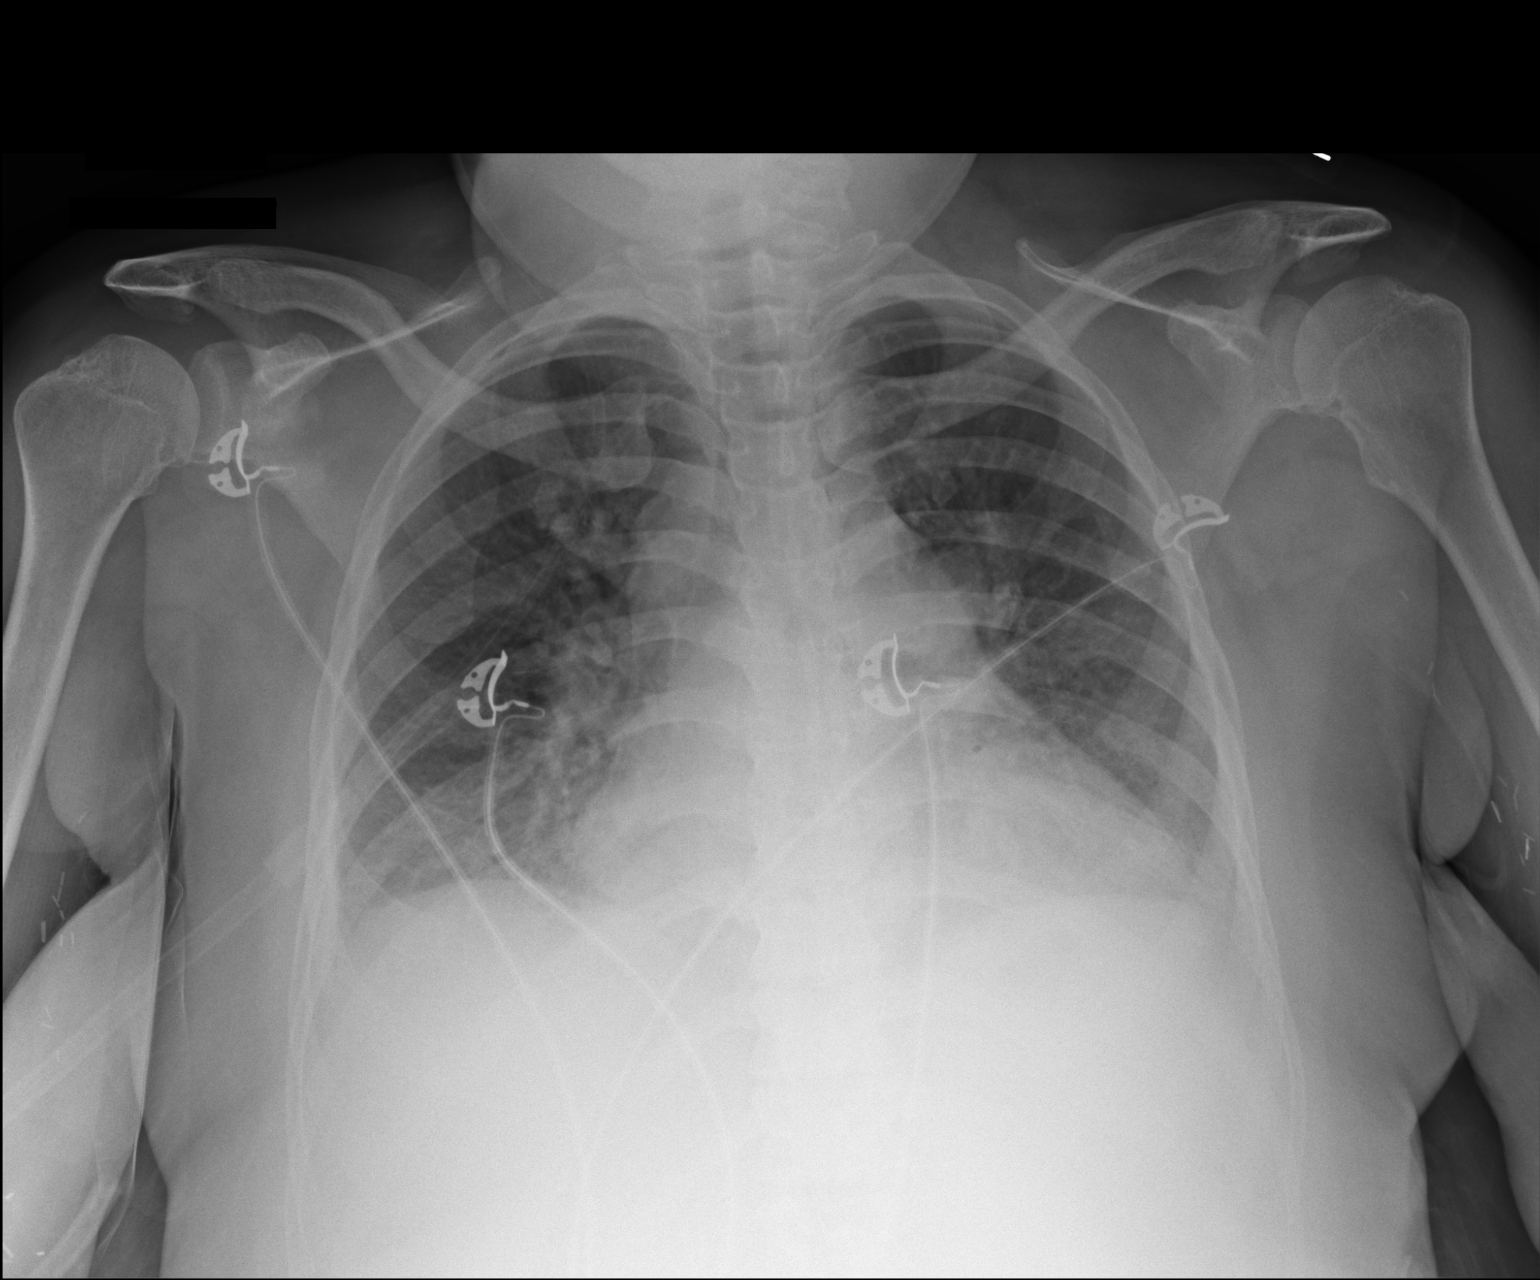

[1 of 1 positions shown; findings below may reference images not displayed]

FINDINGS: Chronic cardiomegaly, accentuated by hypoventilation and portable
technique. Stable aortic and hilar contours, distorted from
technique.

Interstitial and hazy density crowding at the bases without definite
pneumonia or edema. No effusion or pneumothorax.
IMPRESSION: 1. Bibasilar opacity is likely atelectasis given hypoventilation. No
edema or definitive pneumonia.
2. Chronic cardiomegaly.

## 2016-12-22 IMAGING — DX DG CHEST 2V
2 series · 2 of 2 positions shown · non-contrast
Comparison: Portable chest x-ray November 10, 2014

CLINICAL DATA: Cough fever congestion and body aches associated
with nausea vomiting and diarrhea for the past 2-3 days; history of
COPD -asthma, diabetes, and kidney and pancreatic transplant

EXAM:
CHEST  2 VIEW

[chest pa]
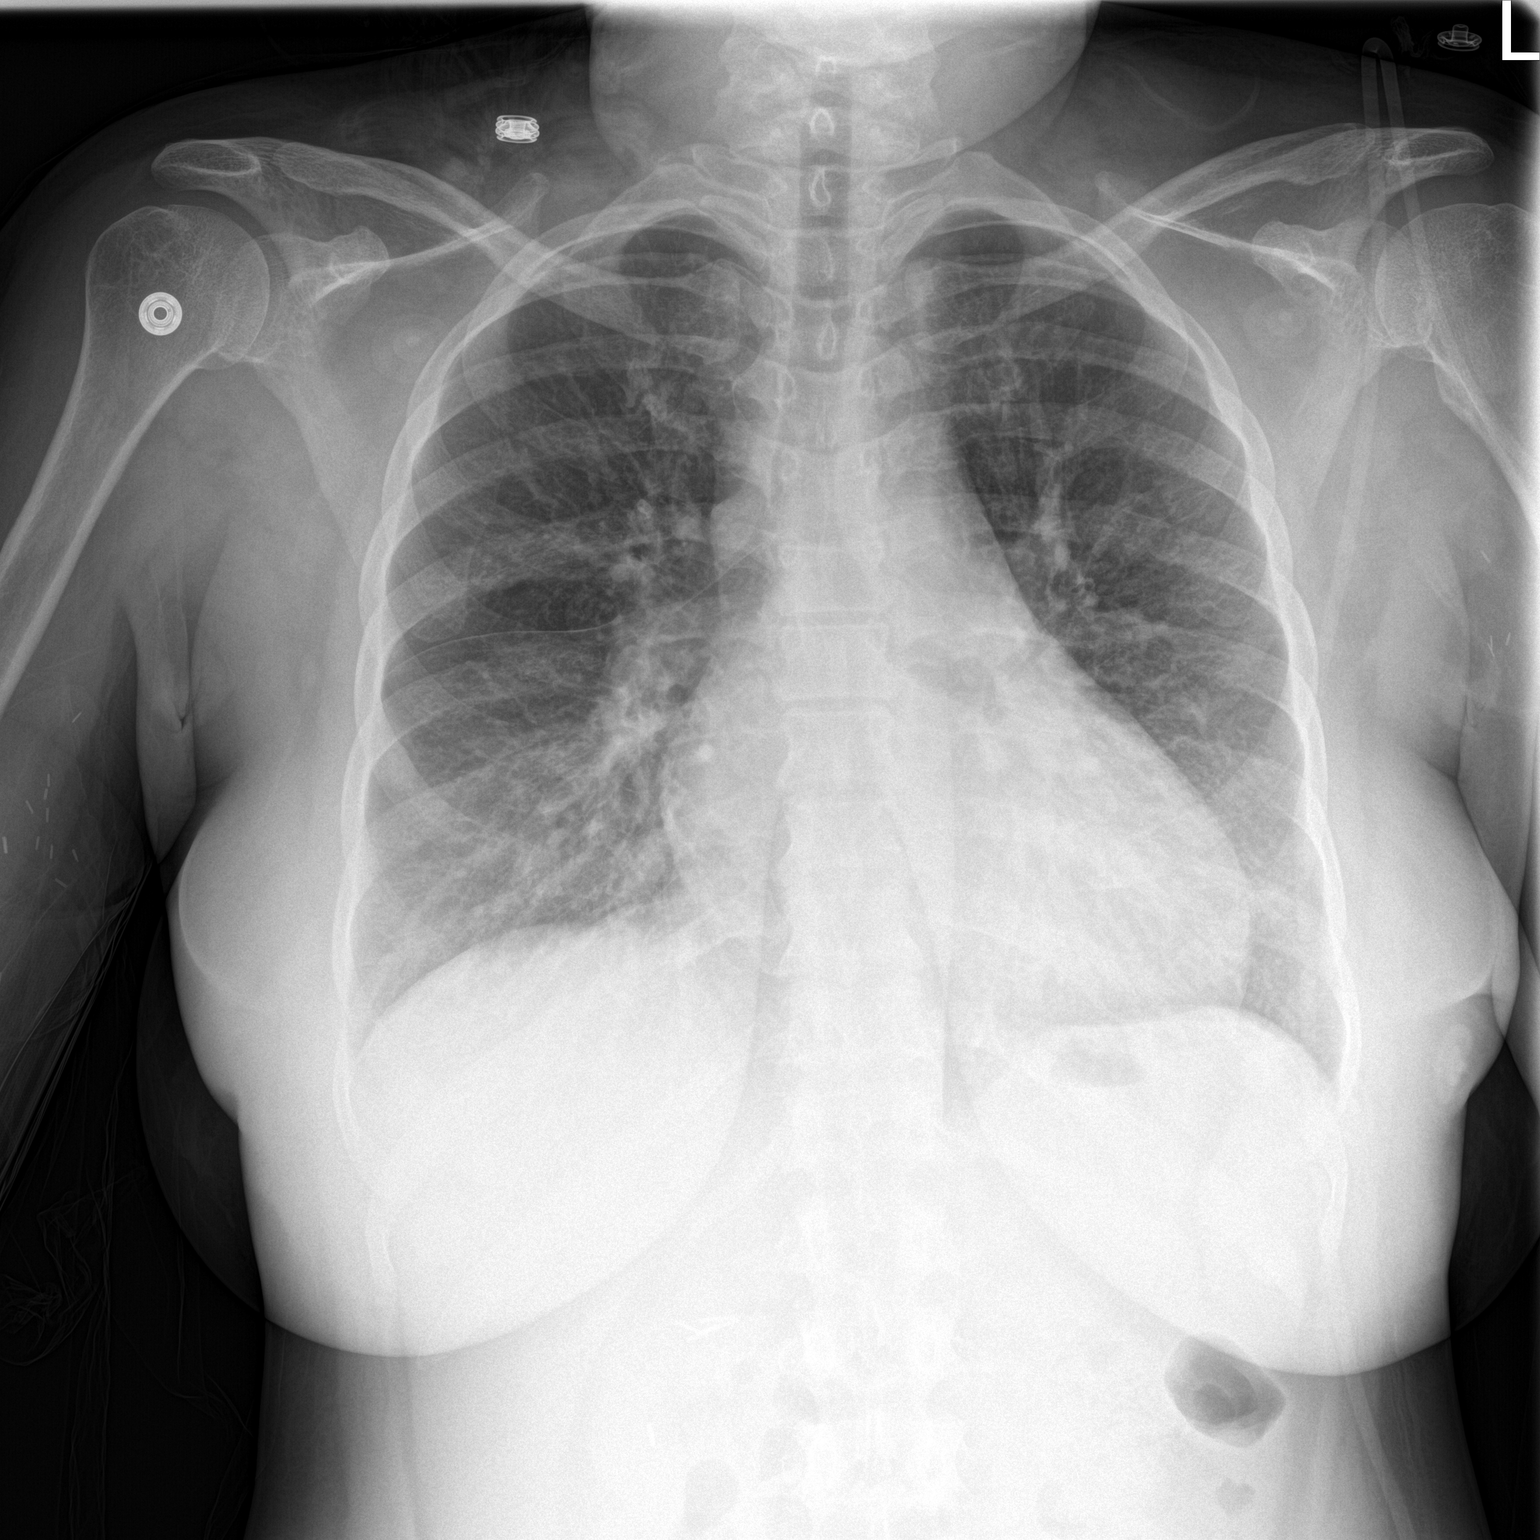

[chest lat]
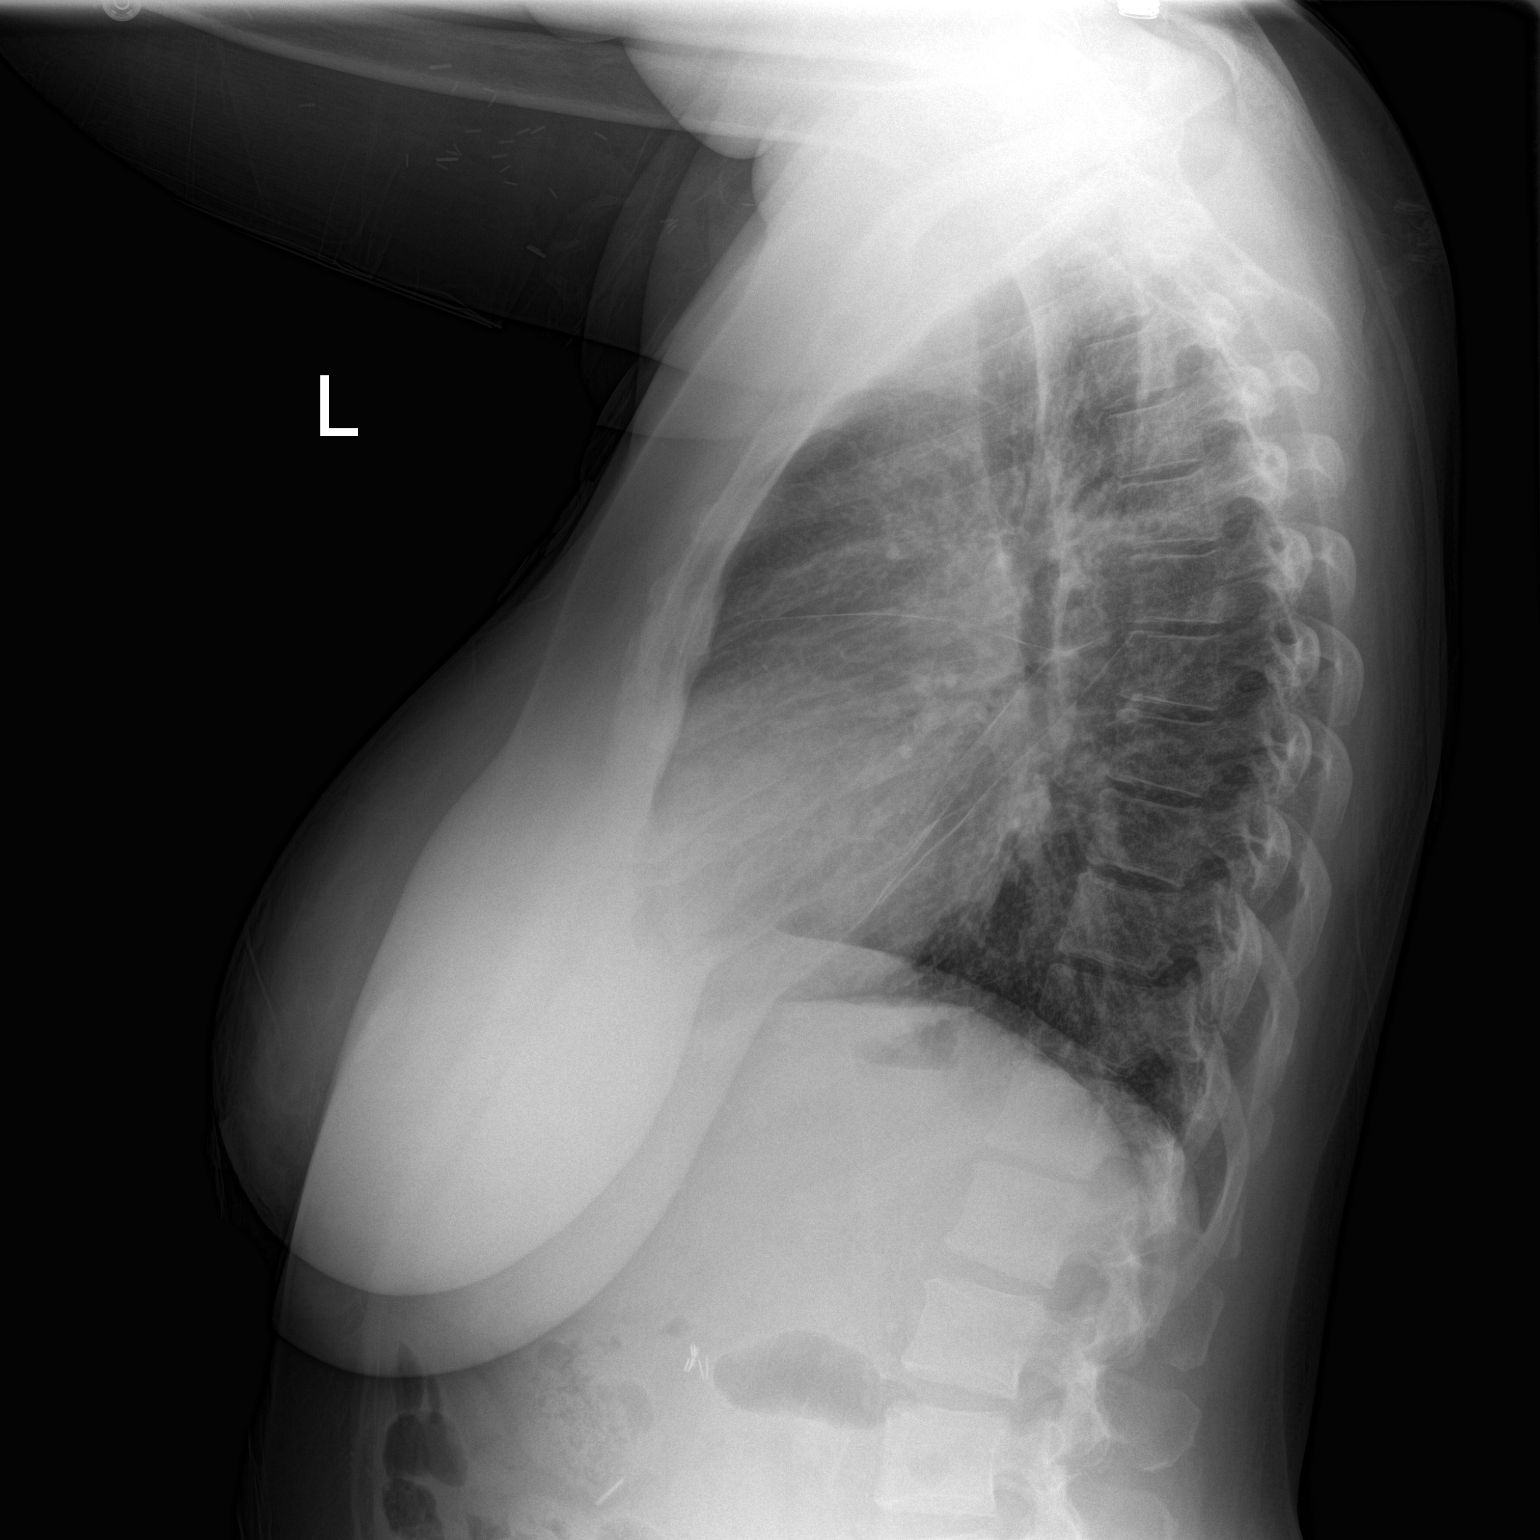

[2 of 2 positions shown; findings below may reference images not displayed]

FINDINGS: Top-normal in size. The pulmonary vascularity is less prominent
today but remains mildly engorged. The mediastinum is normal in
width. The bony thorax is unremarkable.
IMPRESSION: Mild pulmonary interstitial edema, improved since the previous
study. There is no alveolar pneumonia.

## 2016-12-22 IMAGING — XA IR FLUORO GUIDE CV LINE*R*
1 series · 3 of 3 positions shown · non-contrast
Comparison: none

CLINICAL DATA: DIABETIC KETOACIDOSIS, access for insulin drip
appear

[Series 1: run · 3 of 3 slices shown]
[im 1/3]
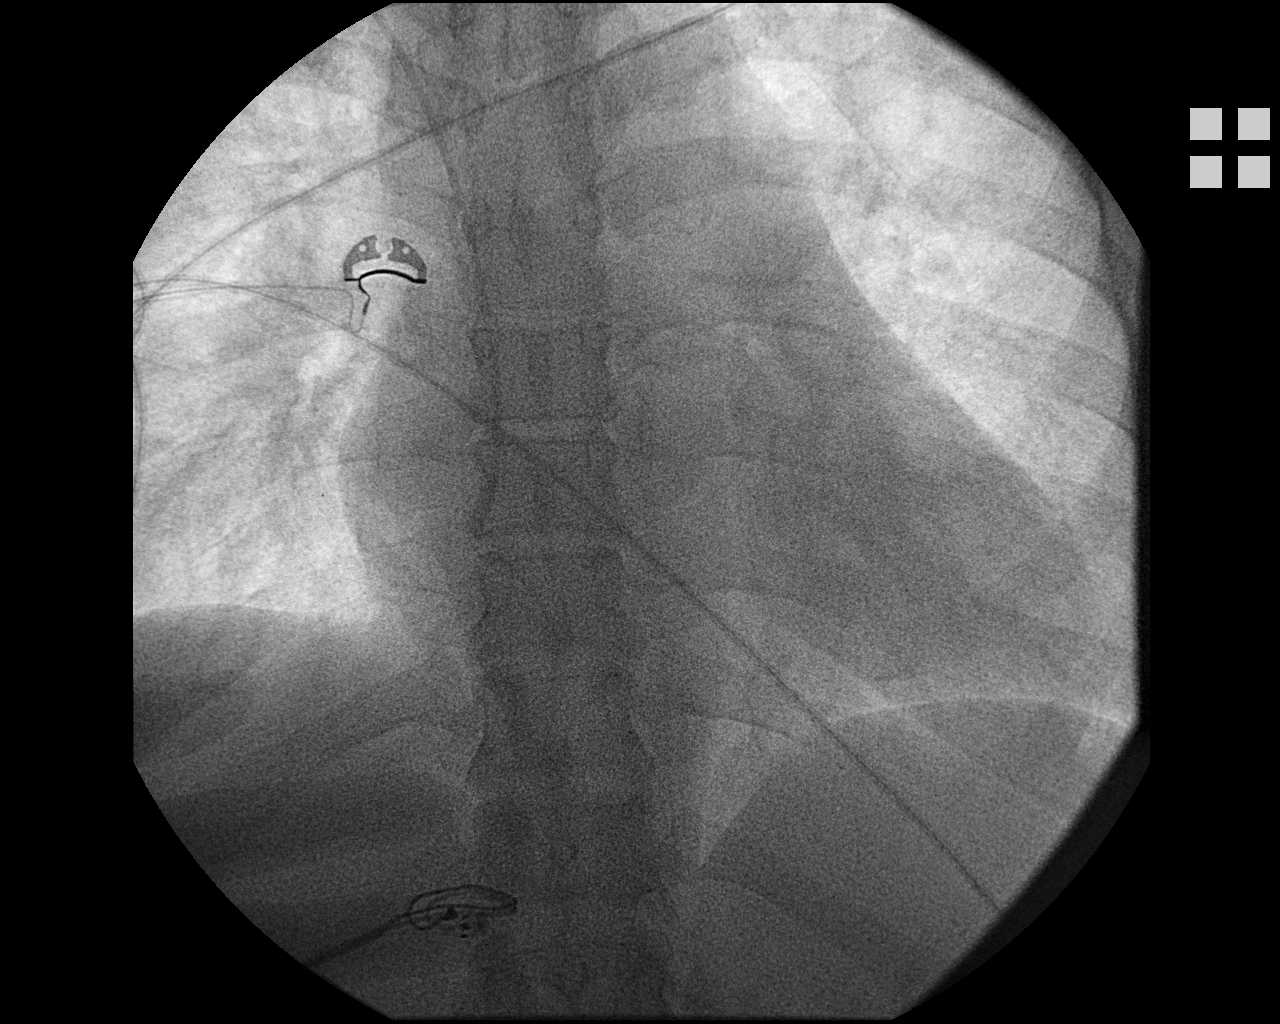
[im 2/3]
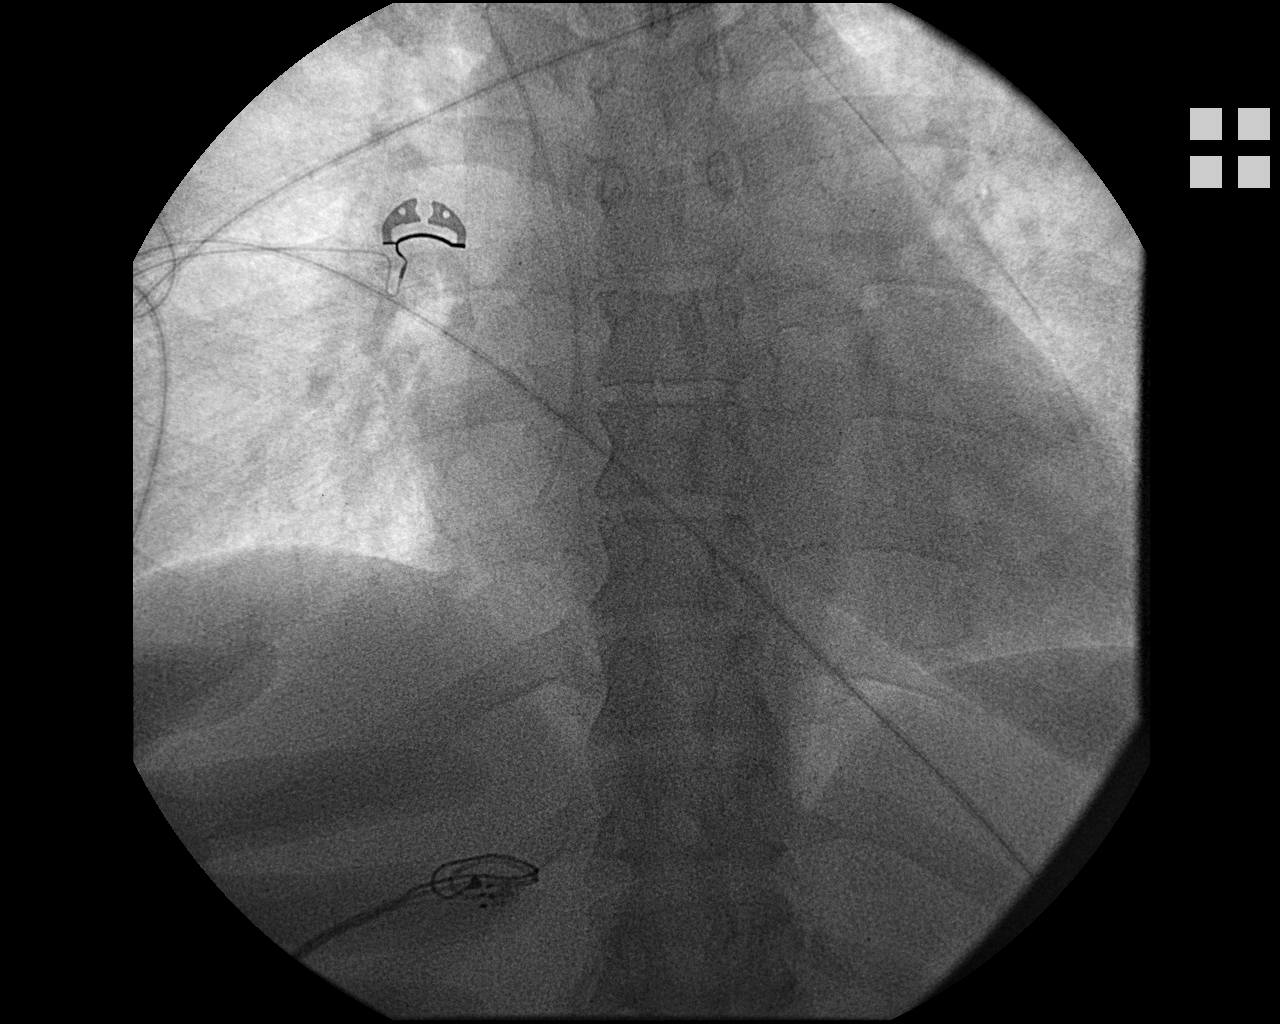
[im 3/3]
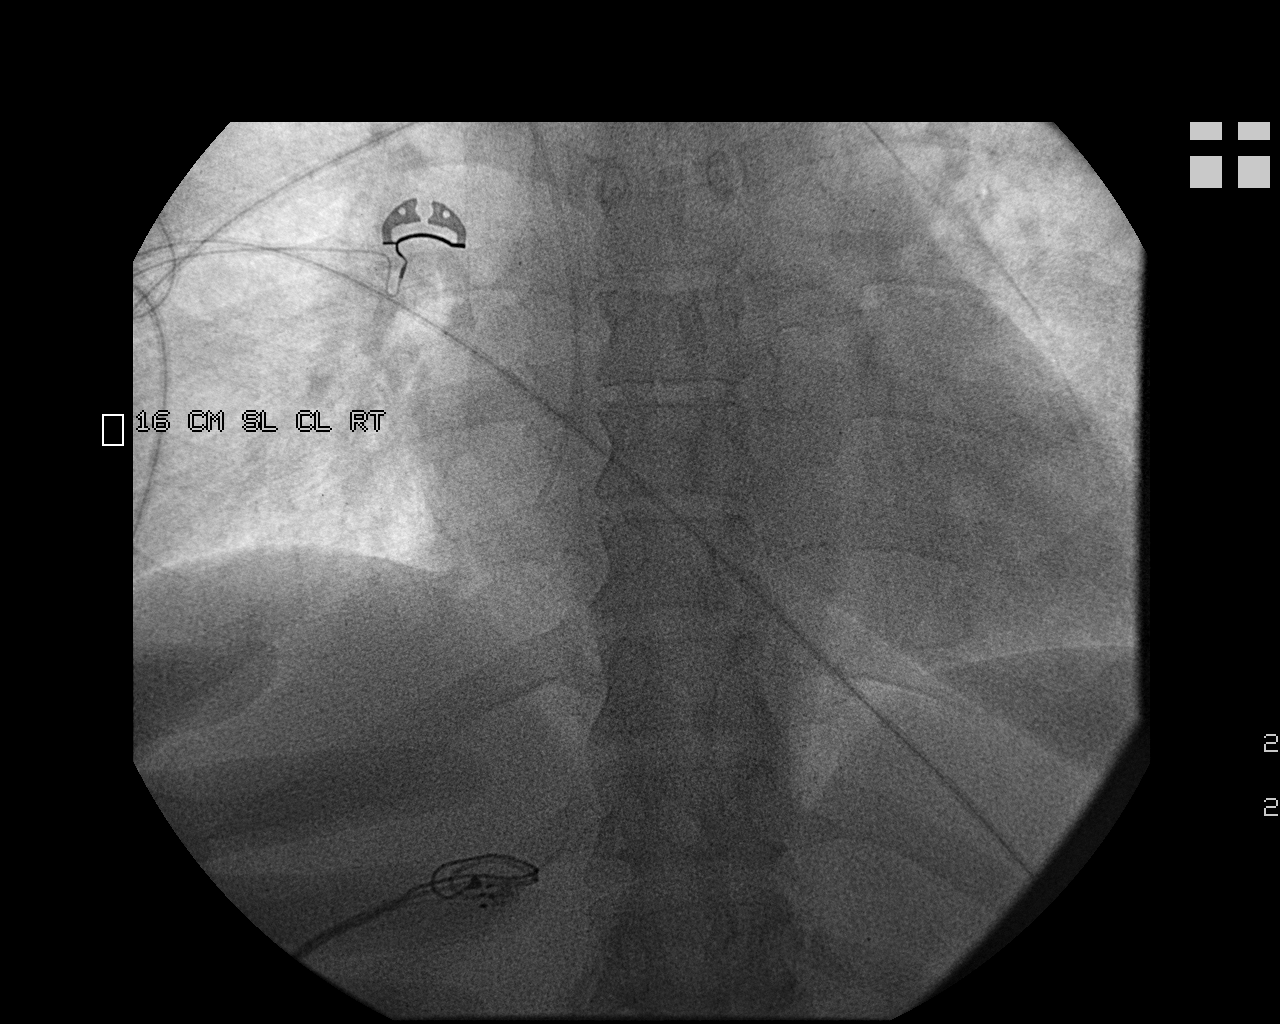

[3 of 3 positions shown; findings below may reference images not displayed]

EXAM:
RIGHT INTERNAL JUGULAR DOUBLE-LUMEN NON TUNNELED POWER PICC LINE
PLACEMENT WITH ULTRASOUND AND FLUOROSCOPIC GUIDANCE

FLUOROSCOPY TIME:  30 SECONDS

PROCEDURE:
The patient was advised of the possible risks andcomplications and
agreed to undergo the procedure. The patient was then brought to the
angiographic suite for the procedure.

The right neckwas prepped with chlorhexidine, drapedin the usual
sterile fashion using maximum barrier technique (cap and mask,
sterile gown, sterile gloves, large sterile sheet, hand hygiene and
cutaneous antisepsis) and infiltrated locally with 1% Lidocaine.

Ultrasound demonstrated patency of the right internal vein, and this
was documented with an image. Under real-time ultrasound guidance,
this vein was accessed with a 21 gauge micropuncture needle and
image documentation was performed. A [DATE] wire was introduced in to
the vein. Over this, a 5 French double lumen non tunneled power PICC
was advanced to the lower SVC/right atrial junction. Fluoroscopy
during the procedure and fluoro spot radiograph confirms appropriate
catheter position. The catheter was flushed and covered with
asterile dressing.

Catheter length: 16

Complications: None immediate
IMPRESSION: Successful right internal jugular double-lumen non tunneled power
PICC line placement with ultrasound and fluoroscopic guidance. The
catheter is ready for use.

## 2016-12-23 IMAGING — XA IR PTA VENOUS
1 series · 12 of 24 positions shown · non-contrast
Comparison: 04/16/2014

EXAM:
DIALYSIS SHUNTOGRAM

VENOUS ANGIOPLASTY

CLINICAL DATA: End stage renal disease, prolonged bleeding after
hemodialysis sections

FLUOROSCOPY TIME:  1 minutes 6 seconds, and 237 mGy
TECHNIQUE: The procedure, risks (including but not limited to bleeding,
infection, organ damage ), benefits, and alternatives were explained
to the patient. Questions regarding the procedure were encouraged
and answered. The patient understands and consents to the procedure.

[Series 1: run · 12 of 39 slices shown]
[im 2/39]
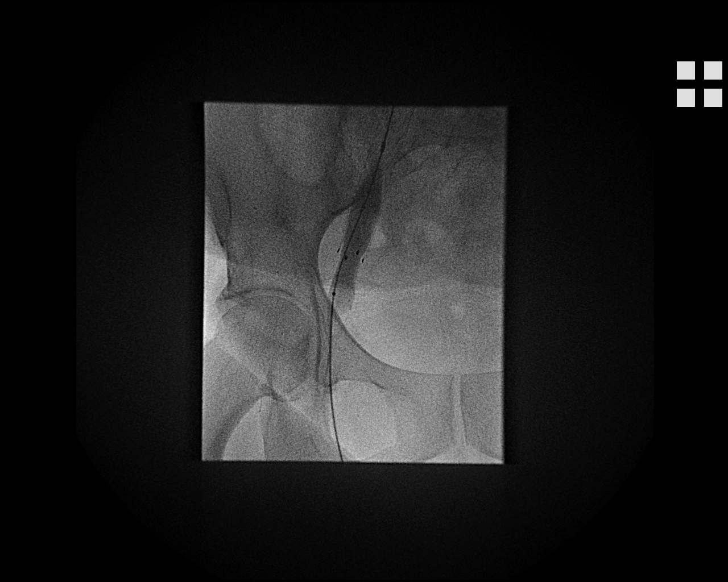
[im 5/39]
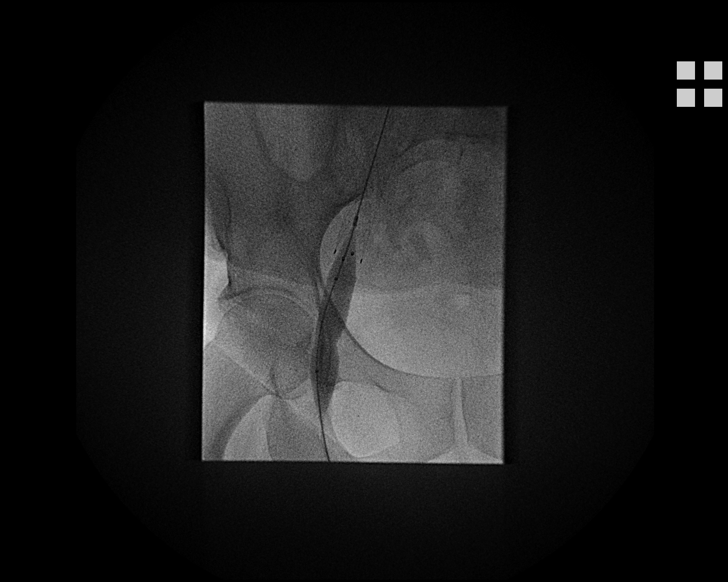
[im 9/39]
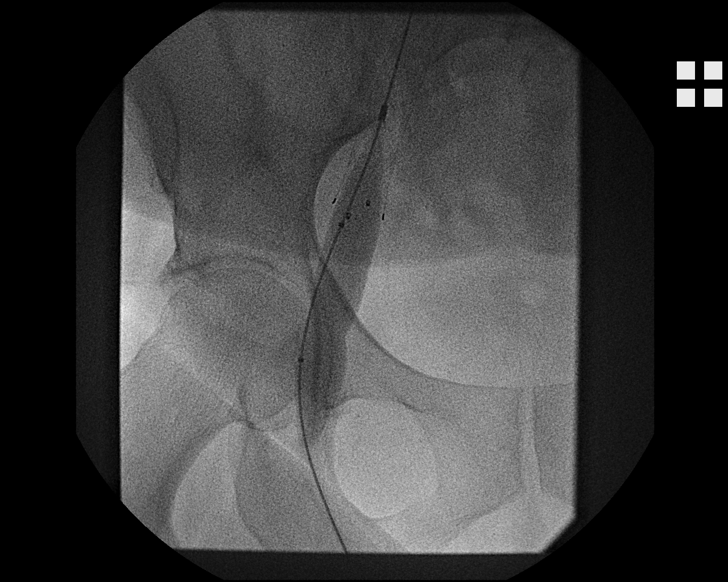
[im 12/39]
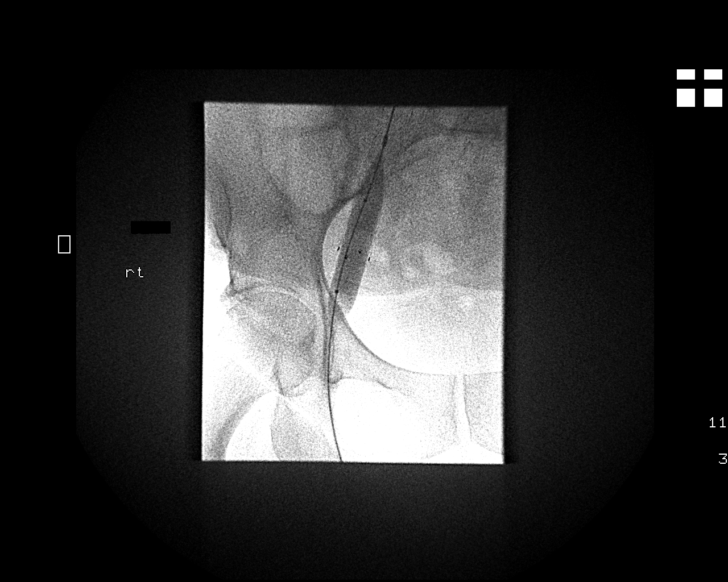
[im 15/39]
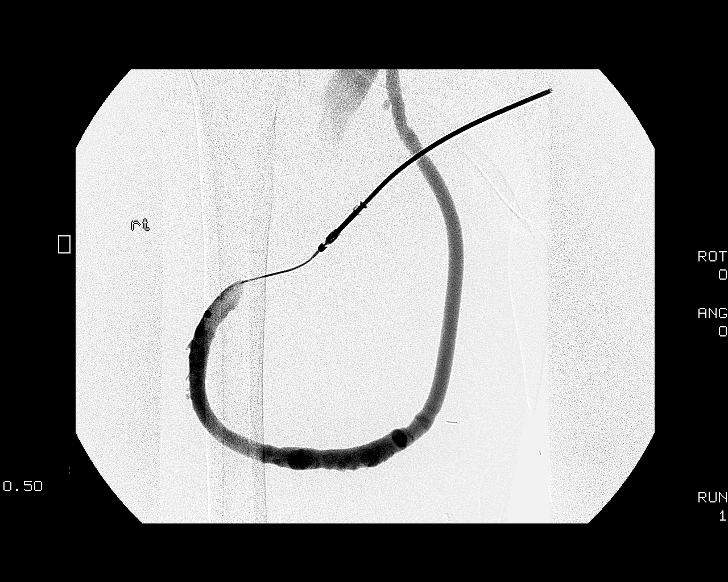
[im 19/39]
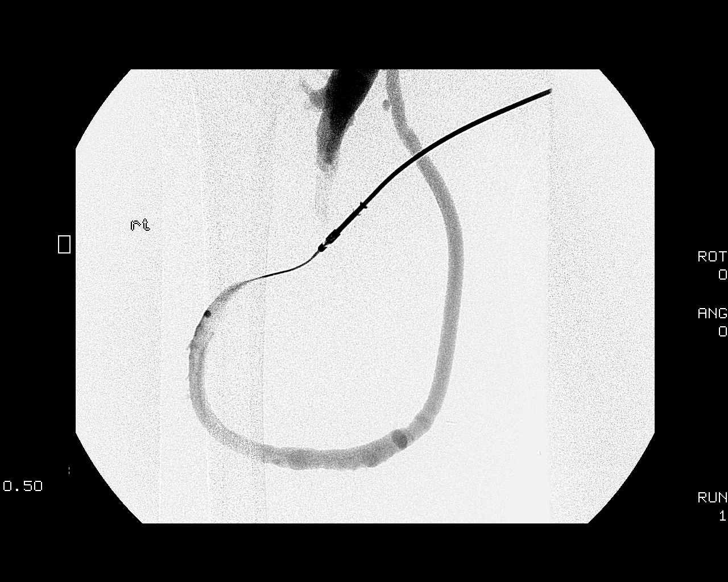
[im 22/39]
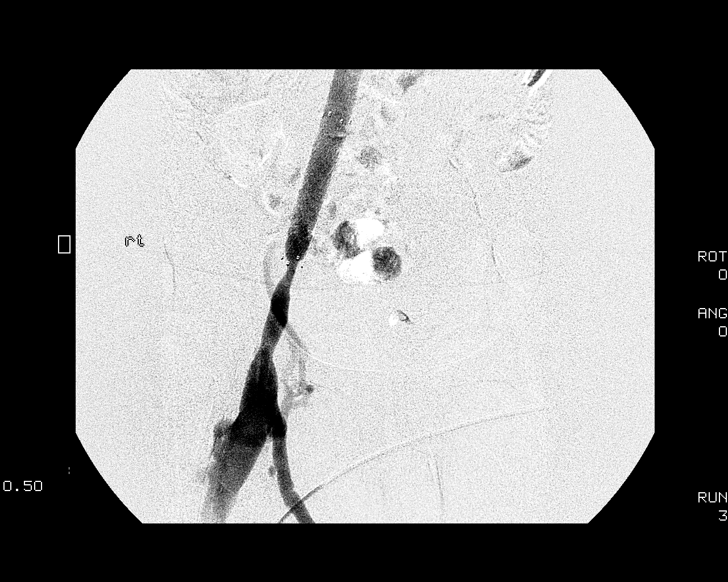
[im 25/39]
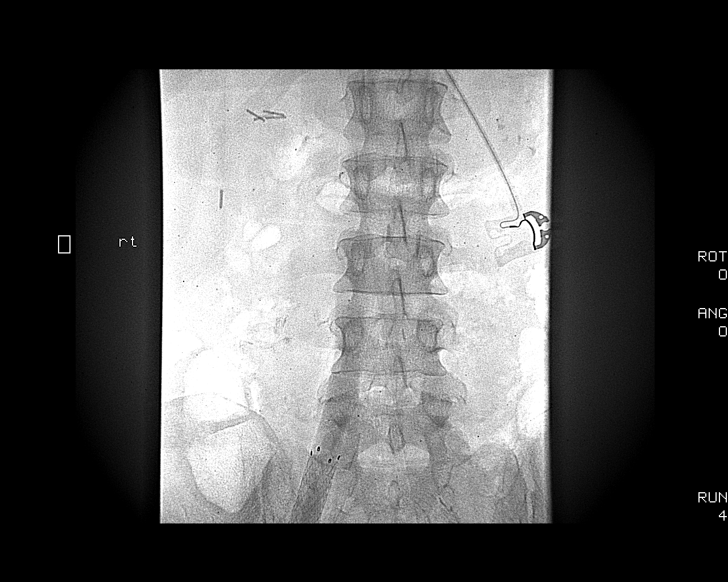
[im 29/39]
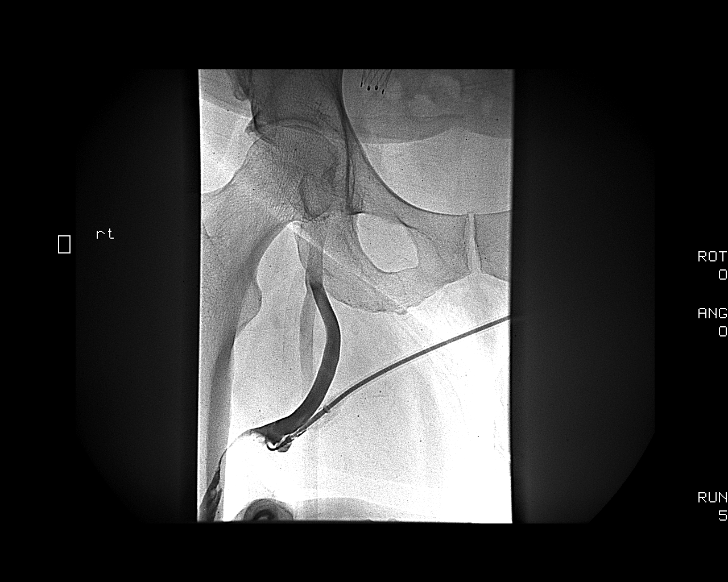
[im 32/39]
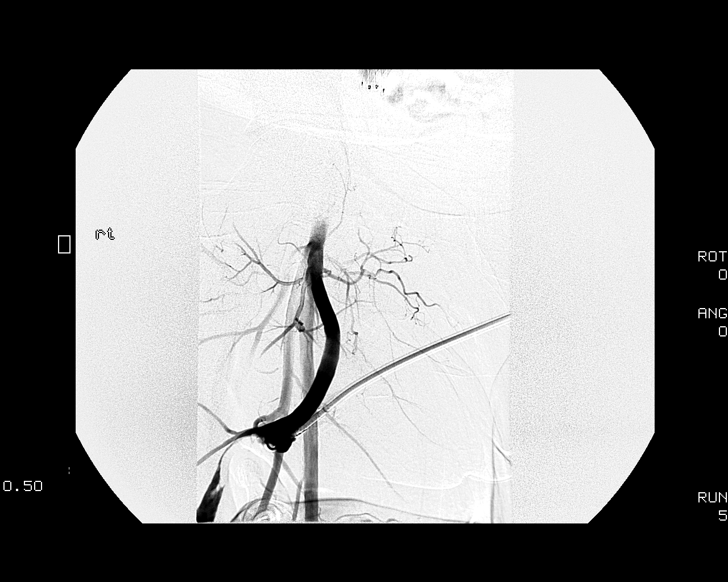
[im 35/39]
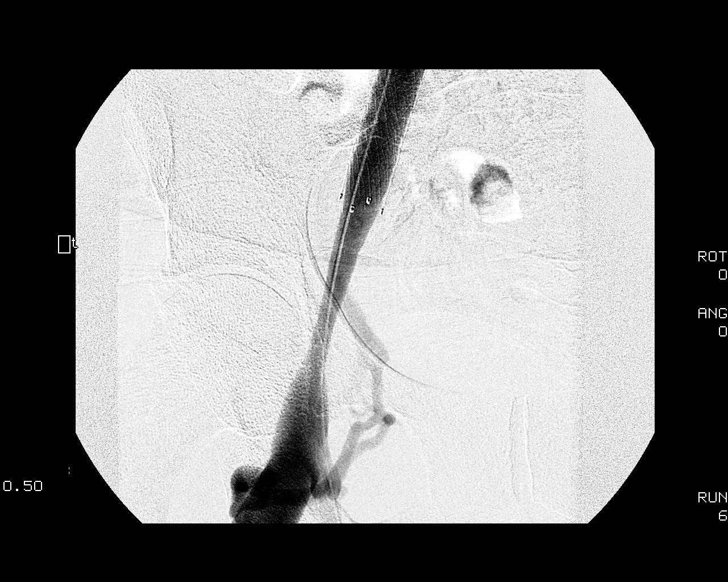
[im 39/39]
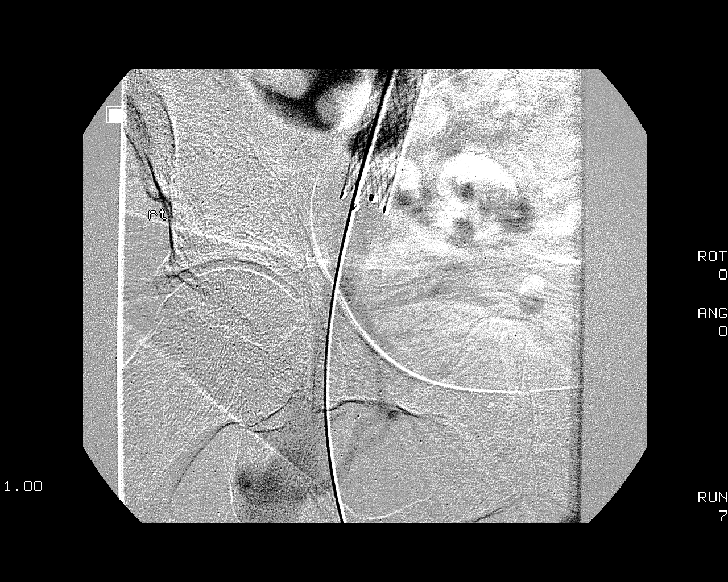

[12 of 24 positions shown; findings below may reference images not displayed]

An 18-gauge angiocatheter was placed antegrade into the arterial
limb of the patient's right thigh synthetic loopAV hemodialysis
fistula for dialysis fistulography. The angiocatheter and
surrounding skin were then prepped with Betadine, draped in usual
sterile fashion, infiltrated locally with 1% lidocaine.

Intravenous Fentanyl and Versed were administered as conscious
sedation during continuous cardiorespiratory monitoring by the
radiology RN, with a total moderate sedation time of 10 minutes.

The angiocatheter was exchanged over a Benson wire for a 6 French
vascular sheath, through which a 12 mm x 4 cm atlas angioplasty
balloon was advanced to the level of a venous outflowstenosis for
venous angioplasty using 60 second overlapping inflations at 18
atmospheres. After follow-up venography, additional PTA was
performed with a 14 mm x 4 cm atlas balloon. After final venography,
the catheter, sheath, and guidewire were removed and hemostasis
achieved with a 2-0 Ethilon purse-string suture. No immediate
complication.
FINDINGS: Arterial anastomosis is widely patent. There is mild irregularity in
the venous limb and apex of the graft without significant intragraft
stenosis. The venous anastomosis is patent. The outflow common
femoral vein has a moderate tapered narrowing, and there is another
focus of moderate short segment narrowing in the right external
iliac vein just inferior to the and iliac venous stent. Body wall
collaterals draining around this level from the common femoral vein
attest to the hemodynamic significance of these lesions. The iliac
venous stent is widely patent, and native venous outflow more
centrally through the IVC is widely patent.

The outflow vein stenoses responded moderately well to 12 mm balloon
angioplasty but follow-up venography demonstrated significant recoil
resulting greater than 50% diameter stenosis. No extravasation or
dissection. Additional angioplasty with a 14 mm balloon resulted in
improved post angiographic appearance with minimal recoil, decreased
filling of collateral venous channels, no extravasation or stenosis.
IMPRESSION: 1. Tandem outflow vein stenoses, with good response to 14 mm balloon
angioplasty.

ACCESS:
Remains approachable for percutaneous intervention as needed.

## 2016-12-23 IMAGING — DX DG CHEST 2V
2 series · 2 of 2 positions shown · non-contrast
Comparison: 12/01/2014

CLINICAL DATA: Central line placement.  Pneumonia.

EXAM:
CHEST  2 VIEW

[chest pa]
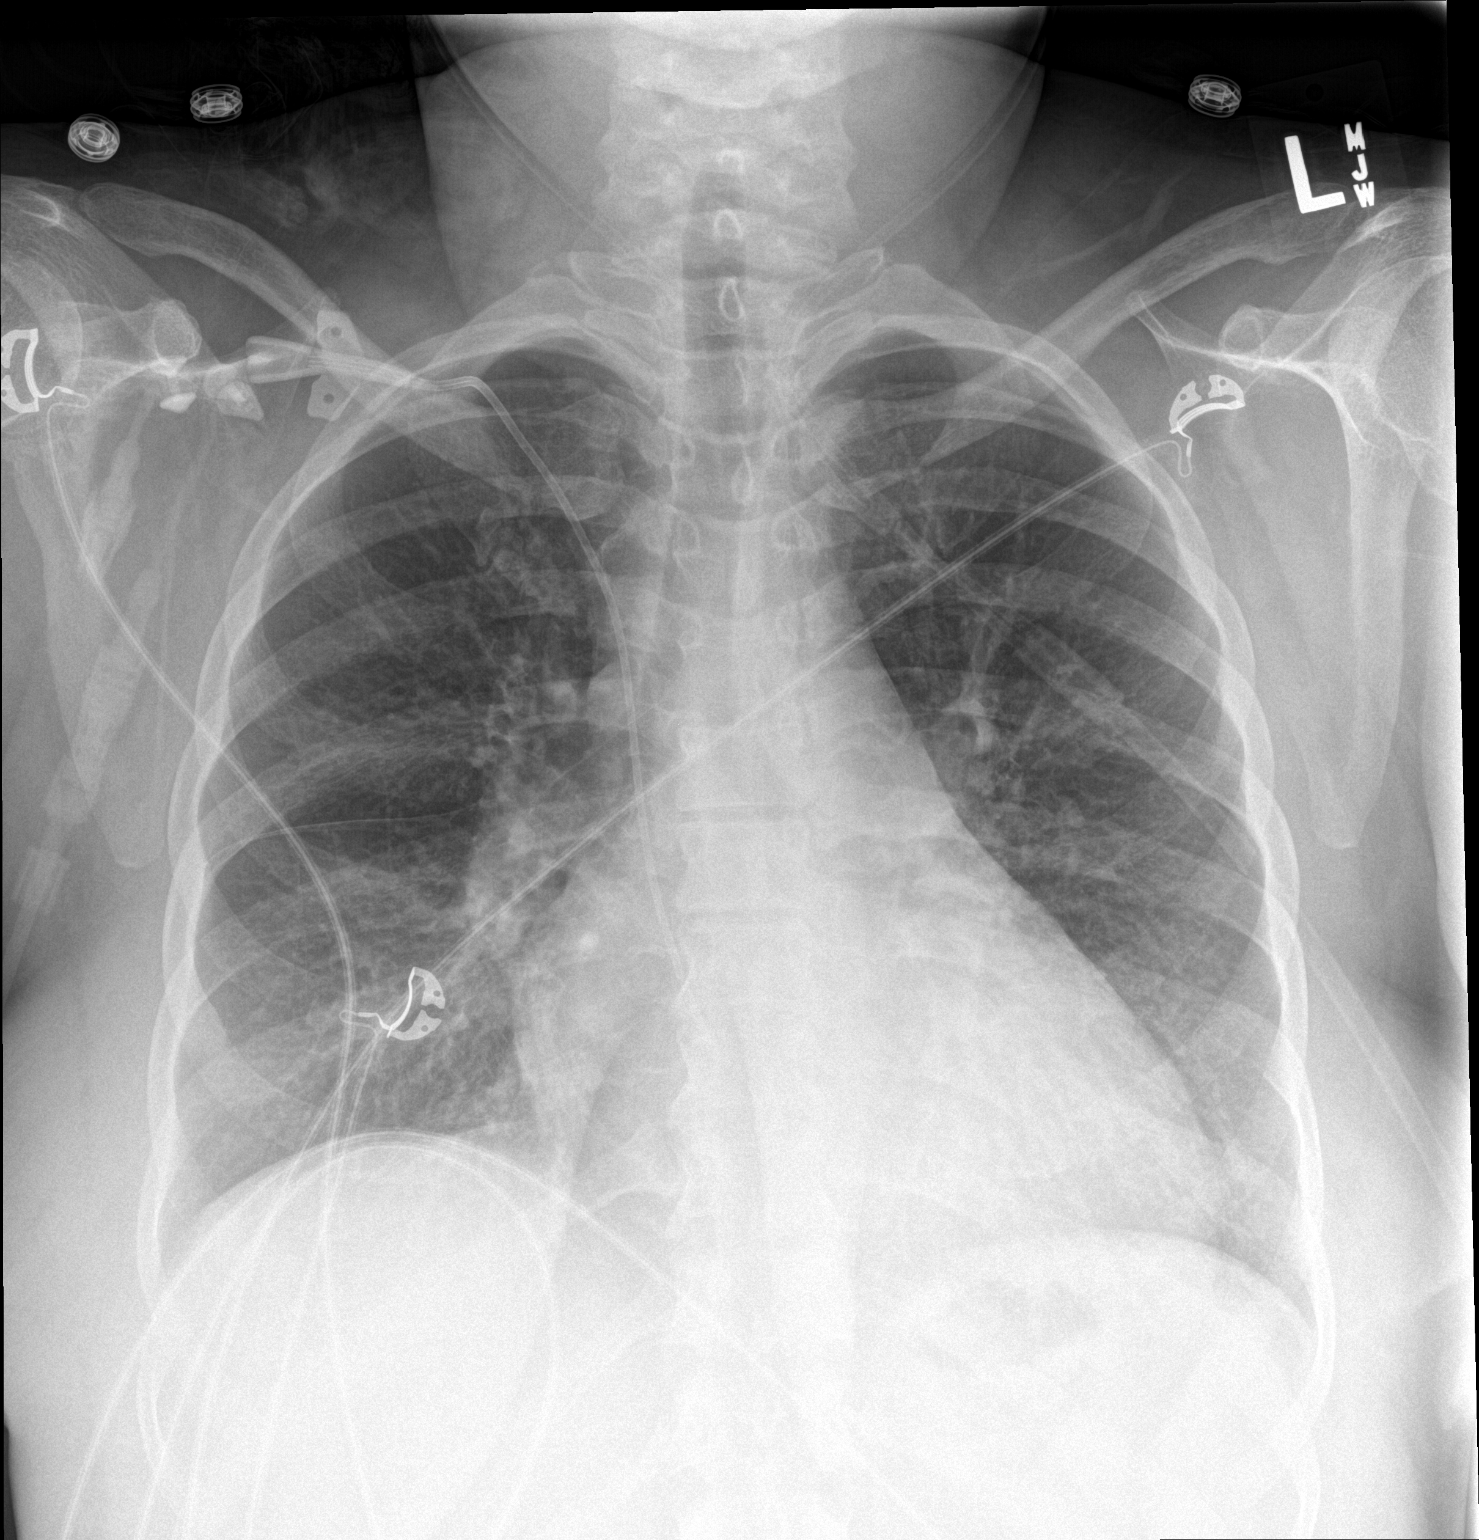

[chest lat]
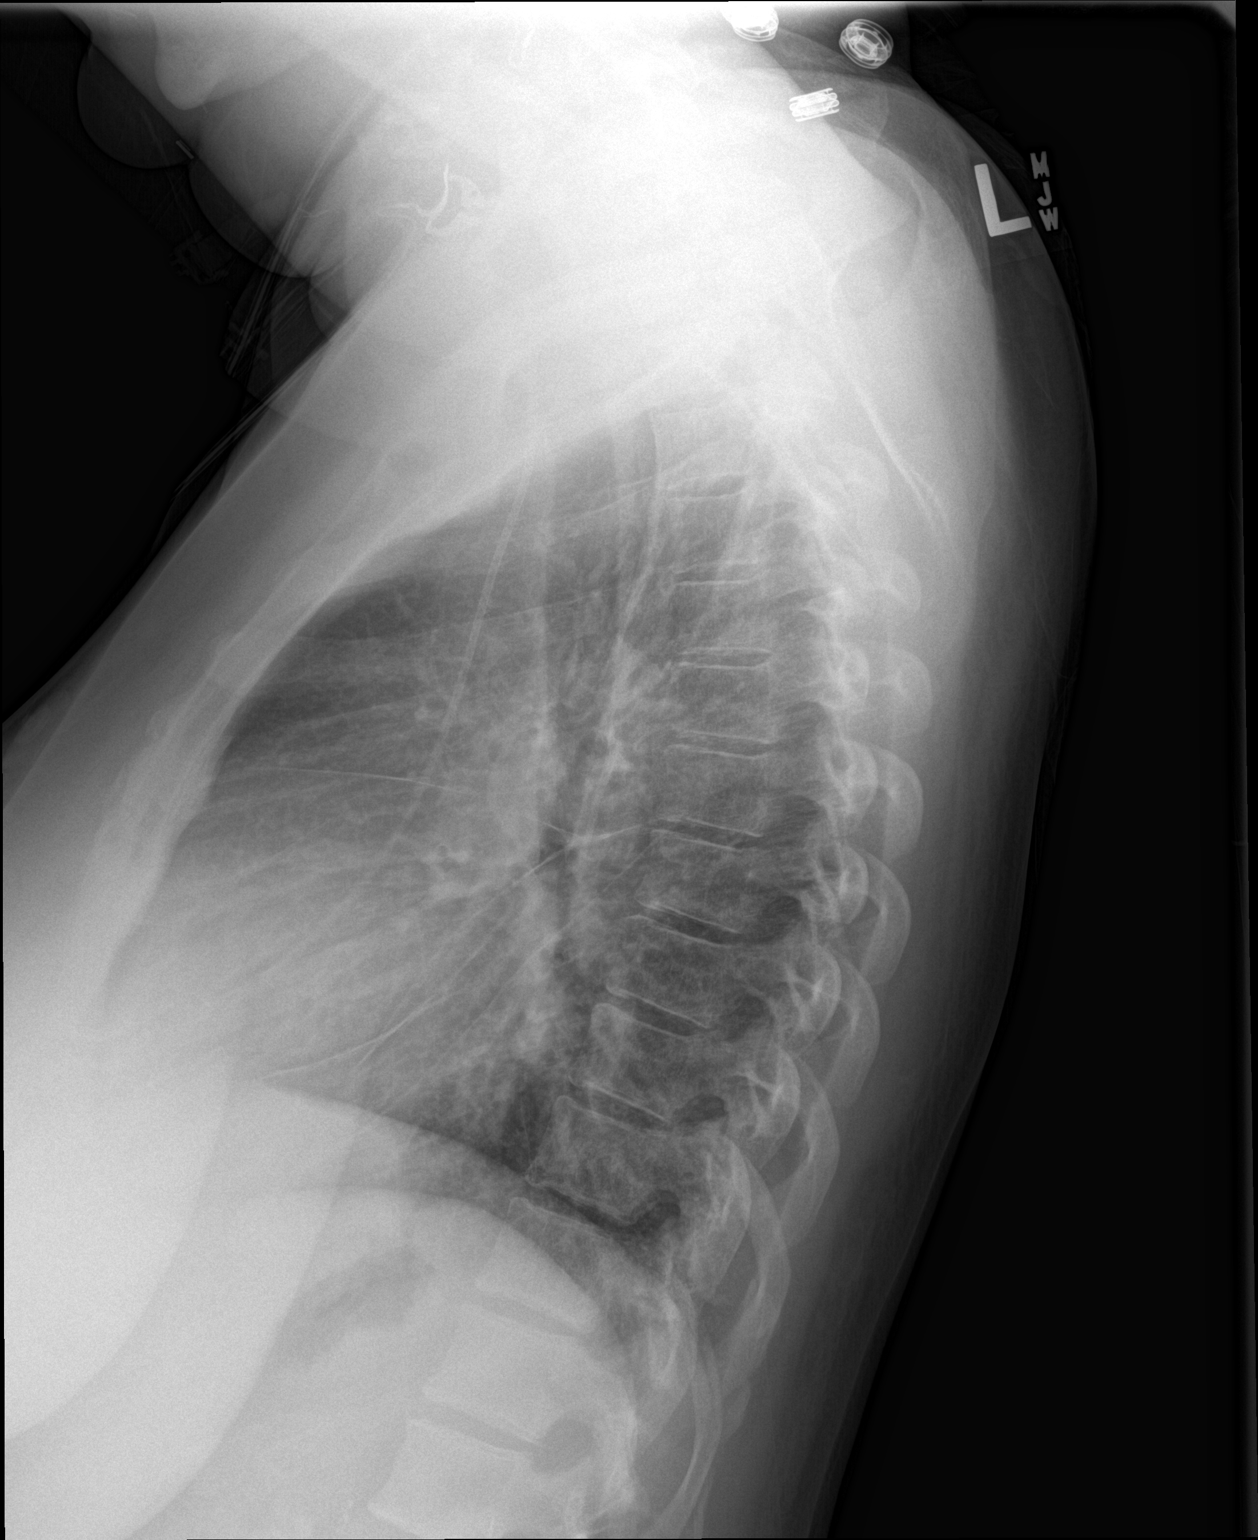

[2 of 2 positions shown; findings below may reference images not displayed]

FINDINGS: A new right-sided central venous catheter is seen with tip overlying
the proximal right atrium. No pneumothorax visualized.

Mild cardiomegaly stable. Decreased interstitial prominence seen
since prior study. No evidence of pulmonary airspace disease or
pleural effusion.
IMPRESSION: New right sided central venous catheter tip overlies the proximal
right atrium. No pneumothorax visualized.

Mild cardiomegaly.  No active lung disease.

## 2017-01-23 IMAGING — DX DG CHEST 1V PORT
1 series · 1 of 1 positions shown · non-contrast
Comparison: Earlier same date.

CLINICAL DATA: Evaluate endotracheal tube placement.

EXAM:
PORTABLE CHEST - 1 VIEW

[chest ap]
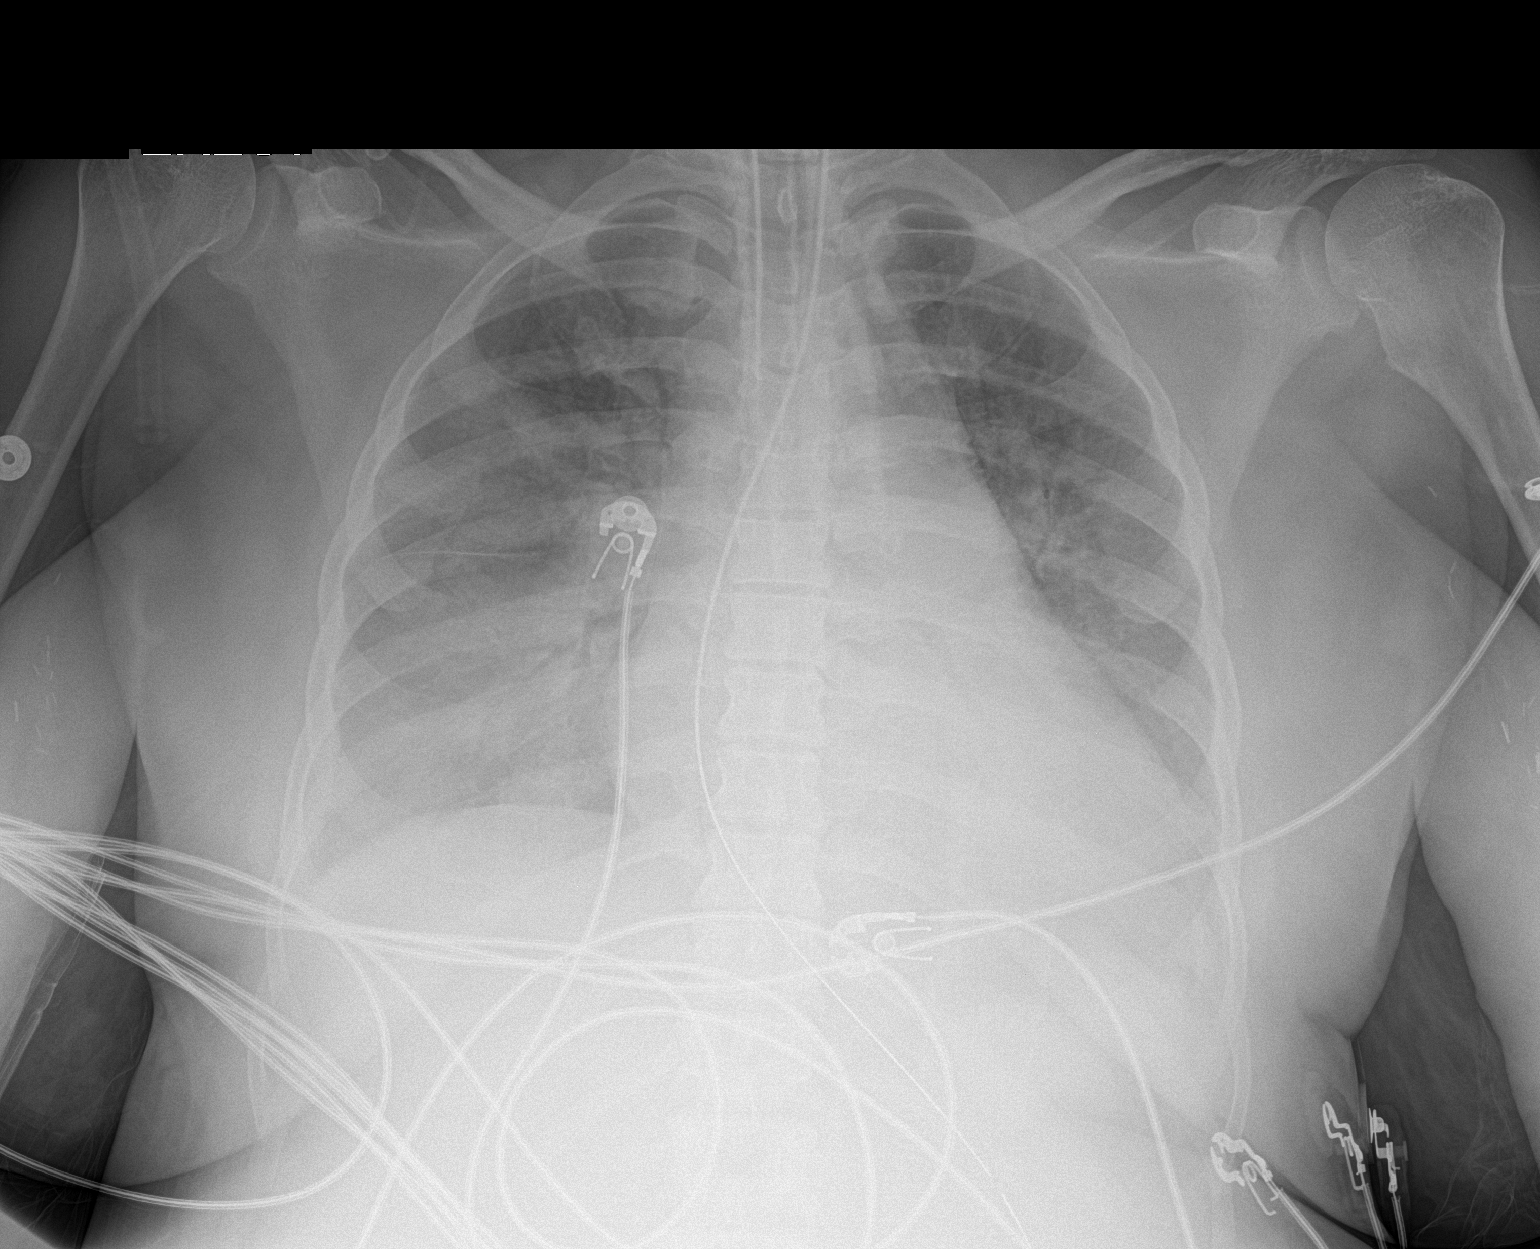

[1 of 1 positions shown; findings below may reference images not displayed]

FINDINGS: Monitoring leads overlie the patient. ET tube terminates within the
mid trachea. Enteric tube courses inferior to the diaphragm. Stable
enlarged cardiac mediastinal contours. Diffuse bilateral airspace
opacities. Small layering bilateral pleural effusions. No
pneumothorax.
IMPRESSION: ET tube terminates in the mid trachea. Enteric tube courses inferior
to the diaphragm.

Cardiomegaly. Diffuse bilateral airspace opacities favored to
represent edema. Pneumonia could have a similar appearance.

## 2017-01-23 IMAGING — CR DG CHEST 1V PORT
1 series · 1 of 1 positions shown · non-contrast
Comparison: 12/02/2014

CLINICAL DATA: Short of breath. History of hypertension, diabetes,
asthma and emphysema.

EXAM:
PORTABLE CHEST - 1 VIEW

[AP]
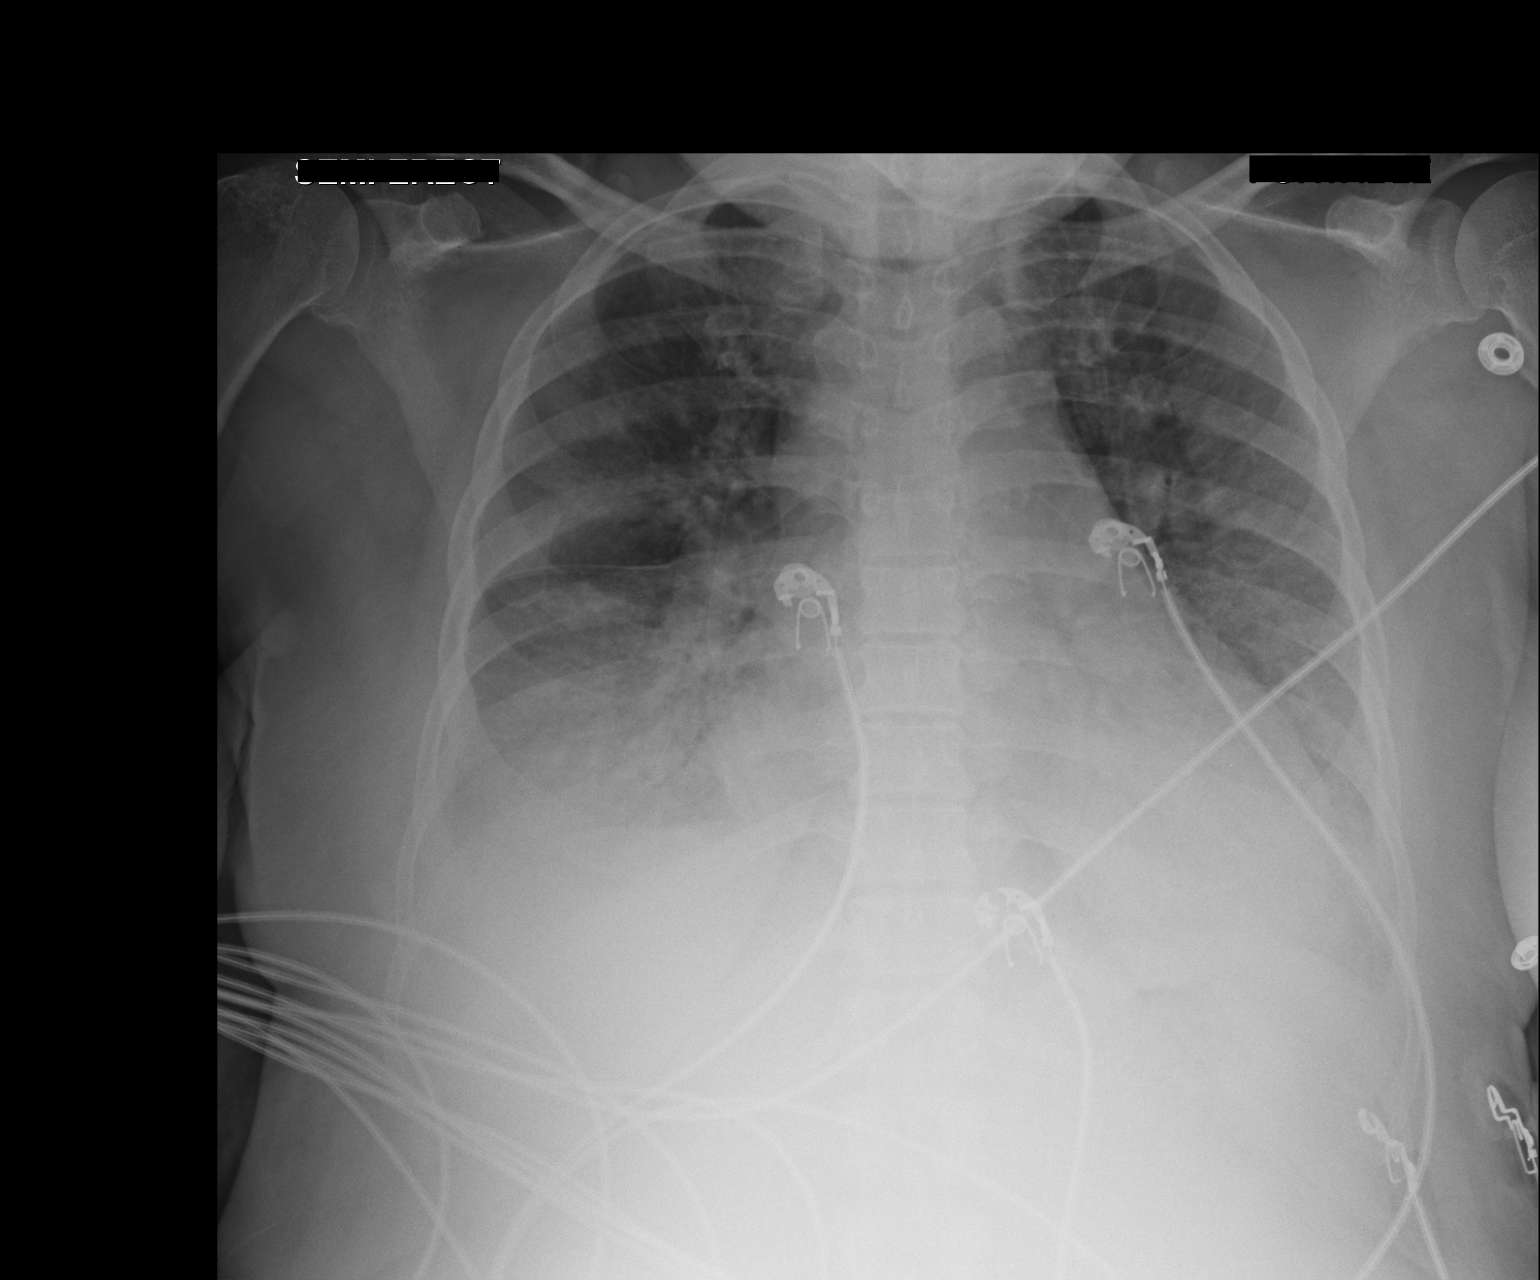

[1 of 1 positions shown; findings below may reference images not displayed]

FINDINGS: Since prior study, hazy opacities developed in the lower lungs. This
obscures hemidiaphragms.

Cardiac silhouette is mildly enlarged. No mediastinal or masses or
adenopathy are noted.

No pneumothorax.

Skeletal structures are unremarkable.
IMPRESSION: 1. There is lower lung zone airspace opacity, new since the prior
chest radiograph. This may reflect bilateral pneumonia or pulmonary
edema. There are likely associated pleural effusions.

## 2017-01-24 IMAGING — CR DG CHEST 1V PORT
1 series · 1 of 1 positions shown · non-contrast
Comparison: 01/02/2015.

CLINICAL DATA: Follow-up respiratory failure.

EXAM:
PORTABLE CHEST - 1 VIEW

[AP]
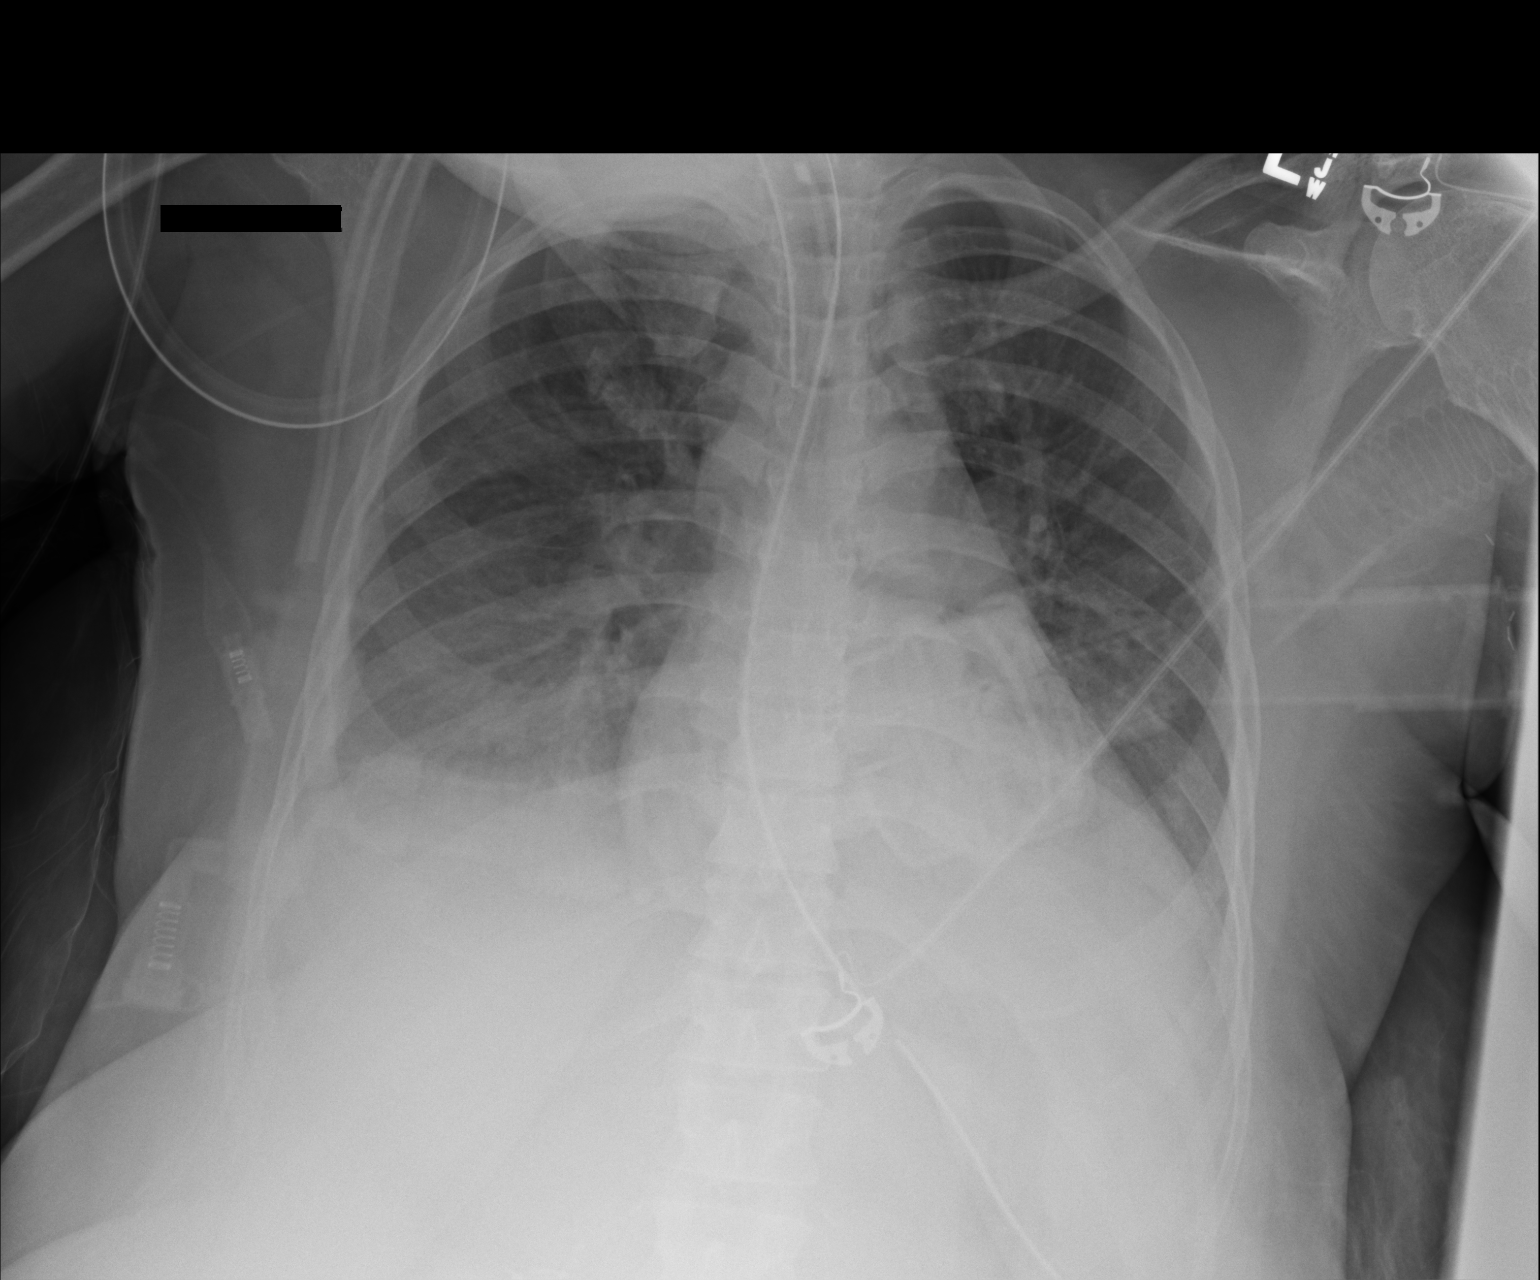

[1 of 1 positions shown; findings below may reference images not displayed]

FINDINGS: Stable support apparatus, with ET tube 4 cm above carina.
Cardiomegaly. BILATERAL effusions with mild to moderate vascular
congestion, slightly improved.
IMPRESSION: Improving pulmonary edema. Cardiomegaly persists. Stable support
tubes and apparatus. No pneumothorax.

## 2017-01-25 IMAGING — CR DG CHEST 1V PORT
1 series · 1 of 1 positions shown · non-contrast
Comparison: 01/03/2015 and earlier.

CLINICAL DATA: Ventilator dependent respiratory failure. Followup
basilar atelectasis

EXAM:
PORTABLE CHEST - 1 VIEW

[AP]
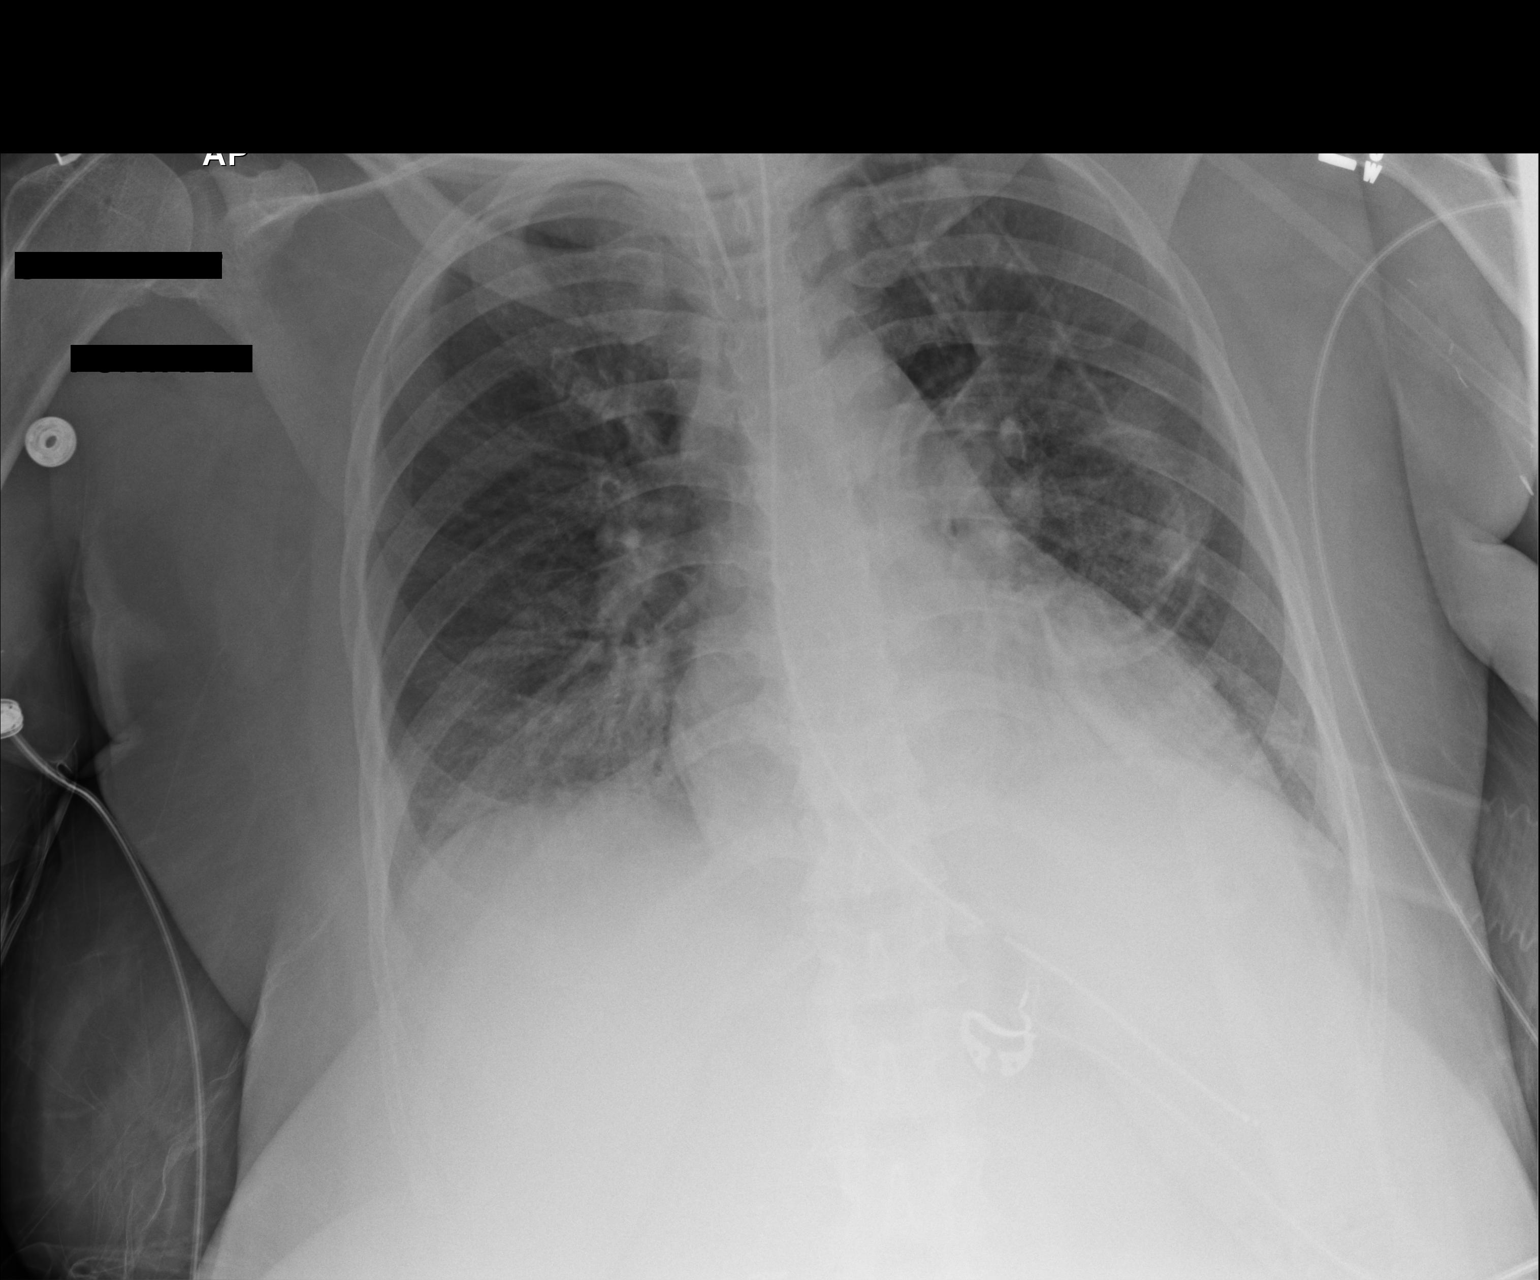

[1 of 1 positions shown; findings below may reference images not displayed]

FINDINGS: Endotracheal tube tip in satisfactory position projecting
approximately 4 cm above the carina. Nasogastric tube tip projects
over the fundus of the stomach. Cardiomediastinal silhouette
unremarkable and unchanged. Atelectasis in the lung bases, left
greater than right, with improved aeration since yesterday. No new
pulmonary parenchymal abnormalities.
IMPRESSION: 1. Support apparatus satisfactory.
2. Mild bibasilar atelectasis, left greater than right, with
improved aeration since yesterday.
3. No new abnormalities.

## 2017-01-26 IMAGING — CR DG CHEST 1V PORT
1 series · 1 of 1 positions shown · non-contrast
Comparison: January 04, 2015

CLINICAL DATA: Fever and shortness of breath

EXAM:
PORTABLE CHEST - 1 VIEW

[AP]
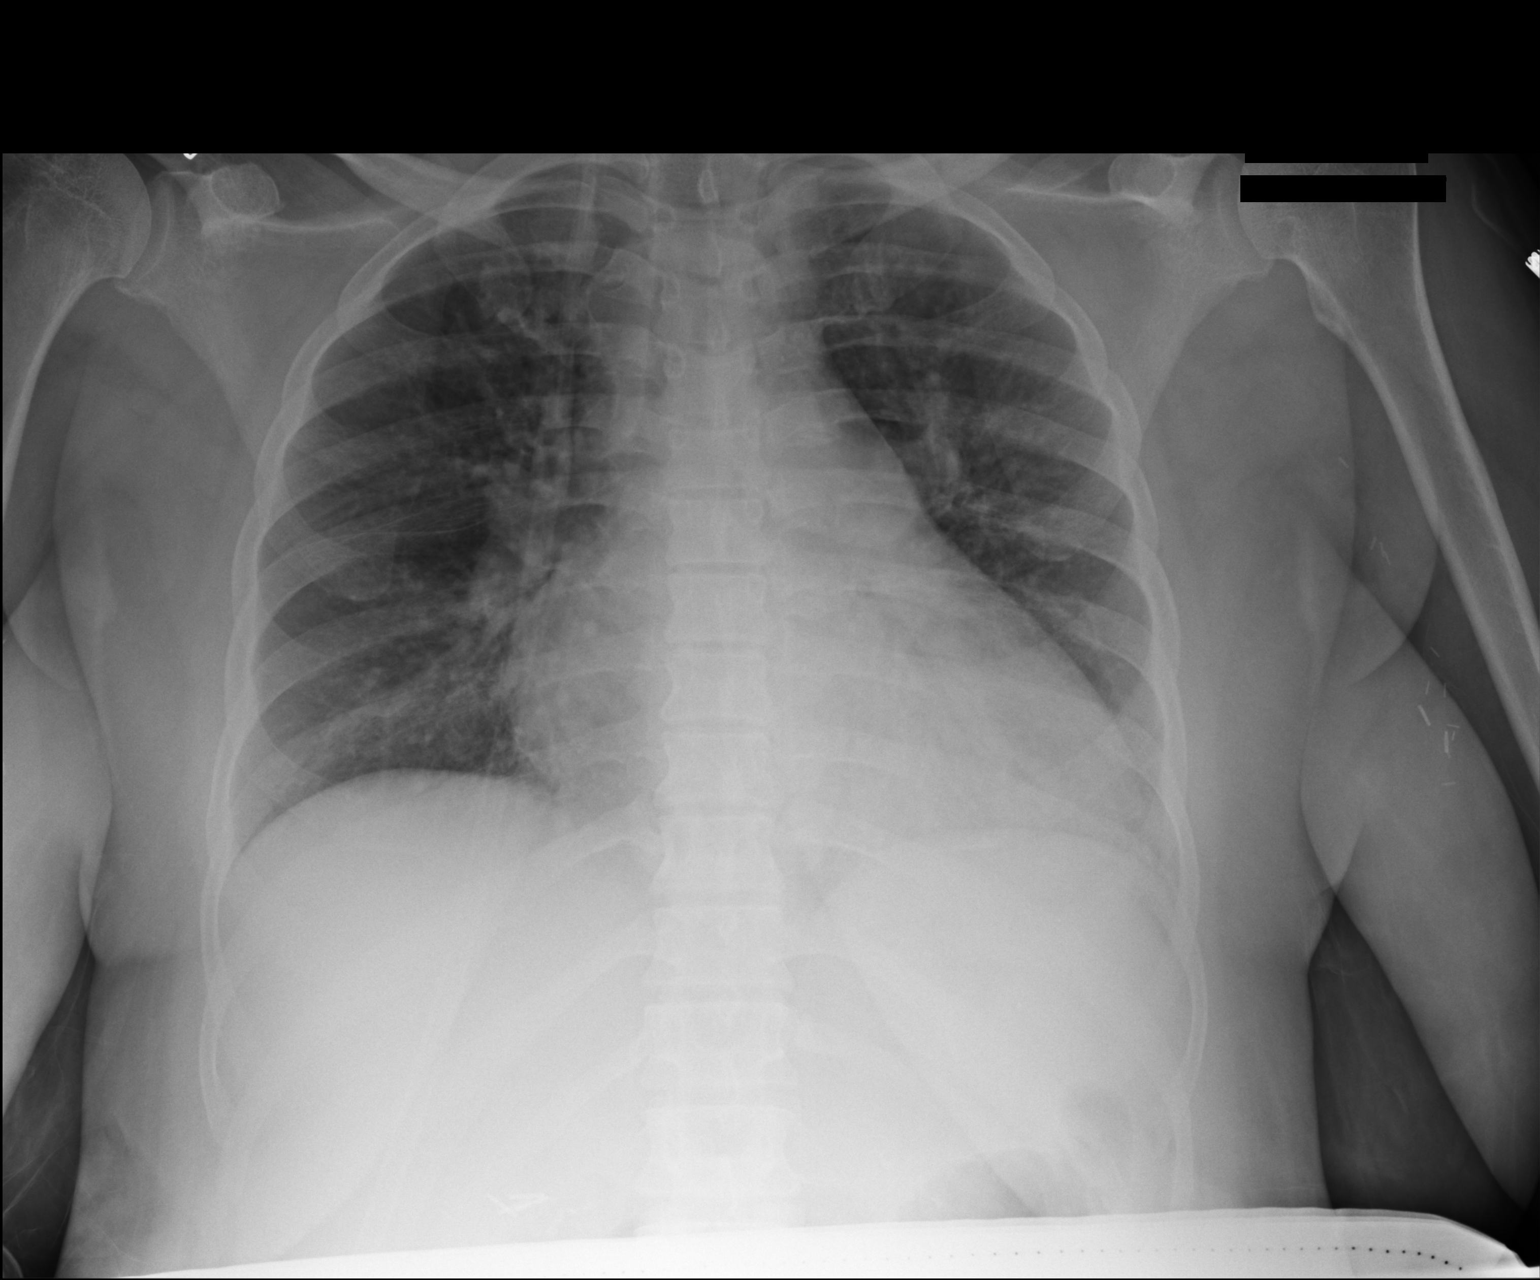

[1 of 1 positions shown; findings below may reference images not displayed]

FINDINGS: Endotracheal tube and nasogastric tube have been removed. No
pneumothorax. There is left lower lobe consolidation. Lungs
elsewhere clear. Heart is mildly enlarged with pulmonary vascularity
within normal limits. No adenopathy. No bone lesions.
IMPRESSION: Left lower lobe airspace consolidation. Lungs elsewhere clear. No
pneumothorax. Heart prominent but stable.

## 2017-02-10 IMAGING — XA IR US GUIDE VASC ACCESS RIGHT
1 series · 2 of 2 positions shown · non-contrast
Comparison: none

ADDENDUM:
Addendum is created to address findings of the study.
CLINICAL DATA: 44-year-old female with a history of renal failure.

She has been referred for placement of central venous catheter, as
she is requiring treatment for DVT.
EXAM:
IR RIGHT FLOURO GUIDE CV LINE; IR ULTRASOUND GUIDANCE VASC ACCESS
RIGHT
Date: 01/20/2015
ANESTHESIA/SEDATION:
None
FLUOROSCOPY TIME:  1 minutes 0 seconds
TECHNIQUE: The right neck and chest was prepped with chlorhexidine, and draped
in the usual sterile fashion using maximum barrier technique (cap
and mask, sterile gown, sterile gloves, large sterile sheet, hand
hygiene and cutaneous antiseptic). Local anesthesia was attained by
infiltration with 1% lidocaine with epinephrine.

[Series 2: fl neuro · 2 of 2 slices shown]
[im 1/2]
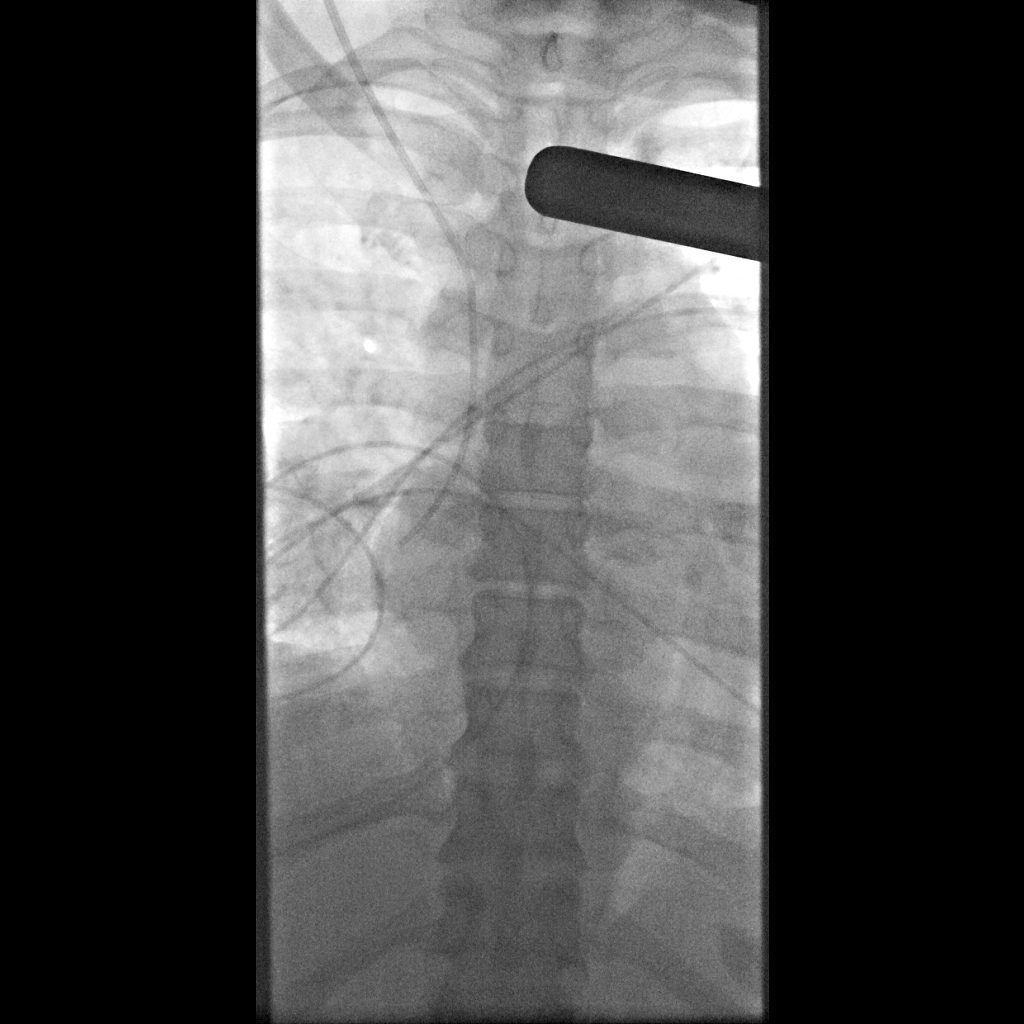
[im 2/2]
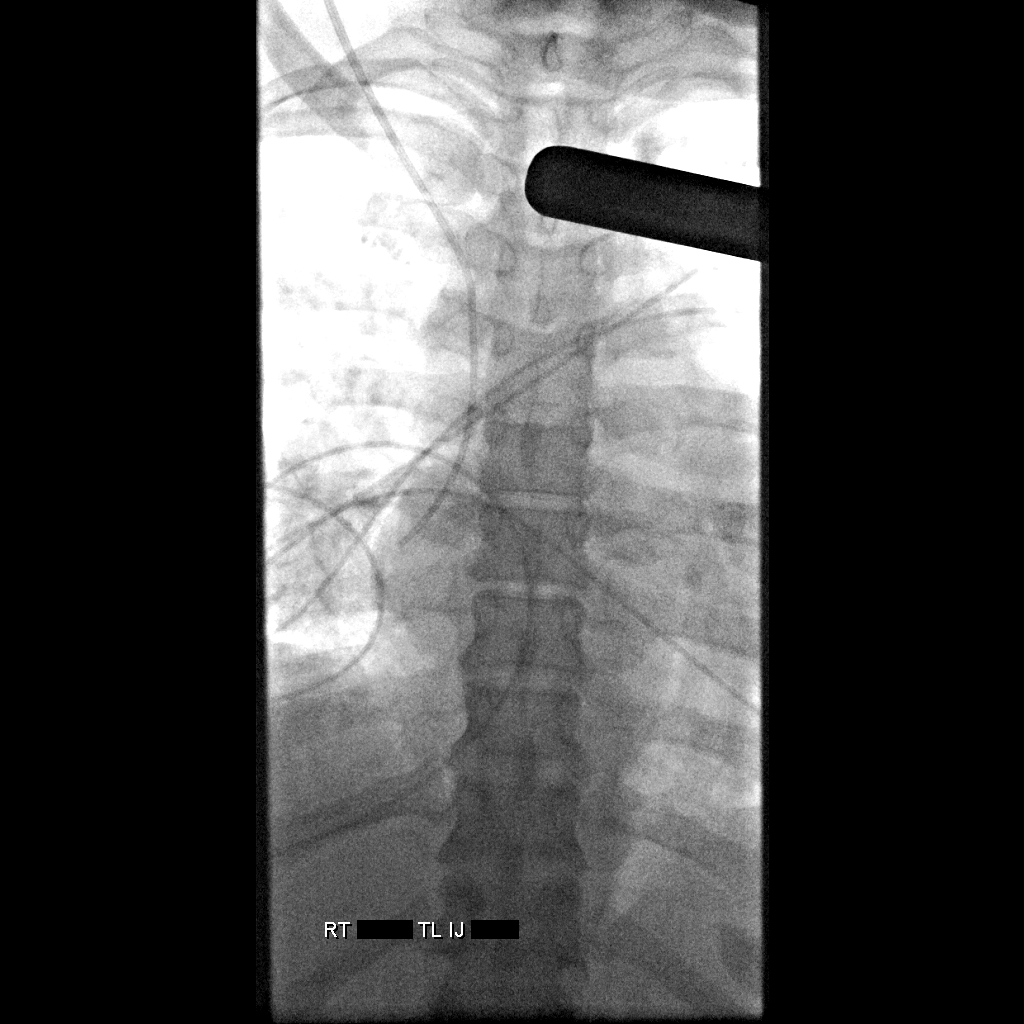

[2 of 2 positions shown; findings below may reference images not displayed]

FINDINGS: Upon completion of right external jugular central venous catheter
placement, tip of the catheter present at the superior cavoatrial
junction.

Catheter ready for use.
Ultrasound demonstrated patency of the right internal jugular vein,
at the level of the sternocleidomastoid. Under real-time ultrasound
guidance, this vein was accessed with a 21 gauge micropuncture
needle and image documentation was performed. The wire would not
pass centrally, and so small amount of contrast was injected through
the needle confirming occlusion of the right internal jugular vein.

Ultrasound survey of the right external jugular vein was then
performed. The skin and subcutaneous tissues were infiltrated 1%
lidocaine overlying the right external jugular vein. Micropuncture
set was then used to puncture the right external jugular vein. The
micro wire passed easily into the IVC under fluoroscopy.

A small incision was made, and the peel-away sheath was placed over
the wire. The wire was removed to estimate internal length of the
catheter, which was estimated 18 cm.

A triple-lumen 5 French catheter was then cut at 18 cm in passed
through the peel-away sheath.

Peel-away sheath was removed, final image was stored, and the
catheter was sutured at the neck.

Patient tolerated the procedure well and remained hemodynamically
stable throughout.

No complications were encountered and no significant blood loss was
encountered.

.

COMPLICATIONS:
None
IMPRESSION: Status post placement of triple-lumen central catheter, via right
external jugular vein approach. Catheter ready for use.

Confirmation of occlusion of the right internal jugular vein.

## 2017-04-21 IMAGING — CT CT CHEST W/ CM
2 of 3 series · 15 of 36 positions shown, 18 images · IV contrast (APPLIED)
Comparison: None.

CLINICAL DATA: RIGHT side neck pain,swelling x 2 days; pt had CT
Neck done, which was suggestive of lymphadenopathy; pt has dry
cough,denies any chest painESRD pt, to be dialyzed later today

EXAM:
CT CHEST WITH CONTRAST
TECHNIQUE: Multidetector CT imaging of the chest was performed during
intravenous contrast administration.
CONTRAST:  80mL OMNIPAQUE IOHEXOL 300 MG/ML  SOLN

[Series 2: thorax 5.0 i31f 1 · axial · 0.65mm/px · z∈[-258,-33]mm · 12 of 53 slices shown, 15 images]
[im 4/53  mediastinal]
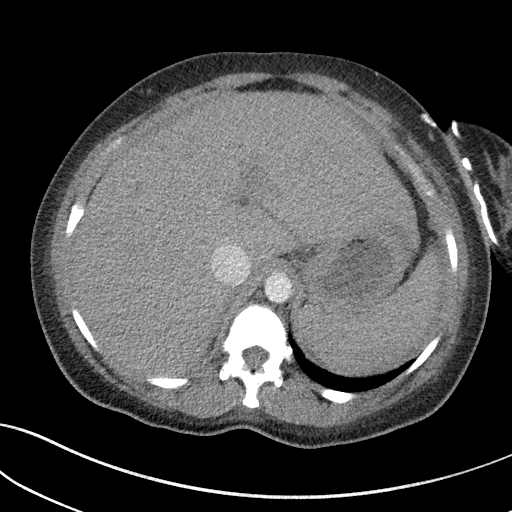
[im 4/53  lung]
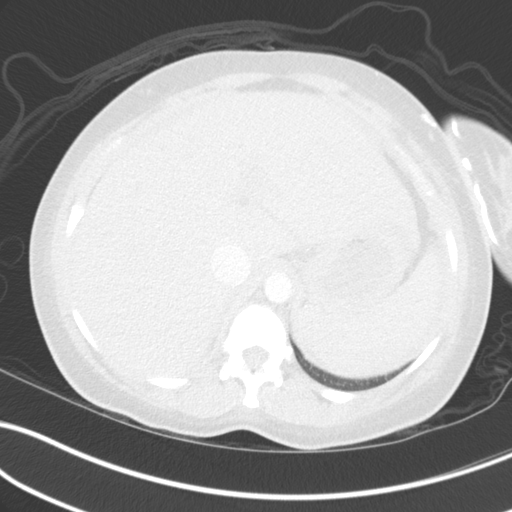
[im 8/53  lung]
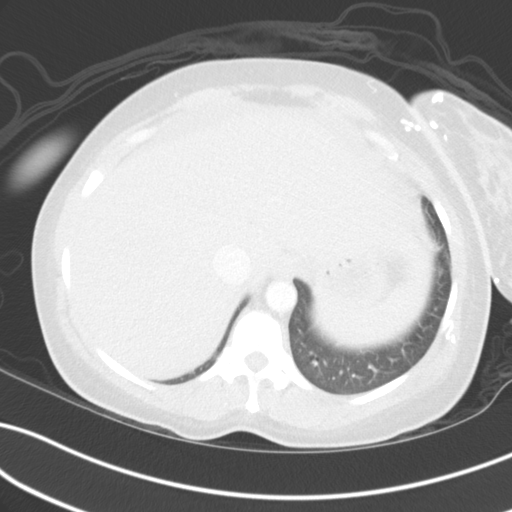
[im 12/53  lung]
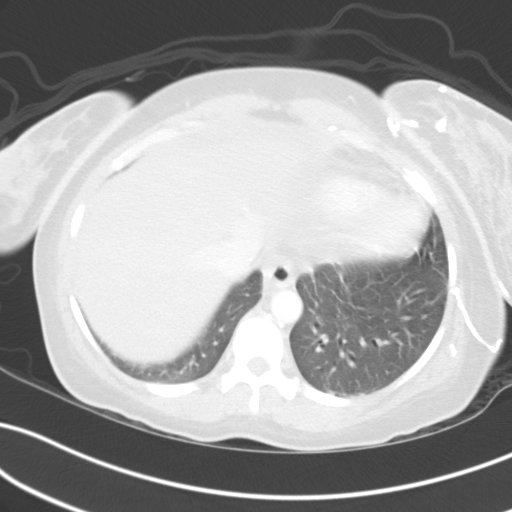
[im 16/53  lung]
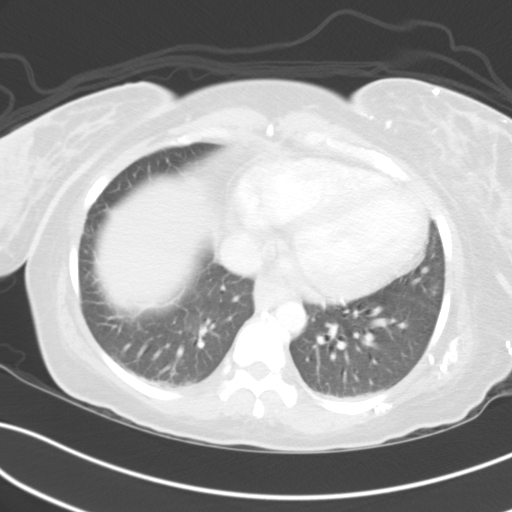
[im 20/53  mediastinal]
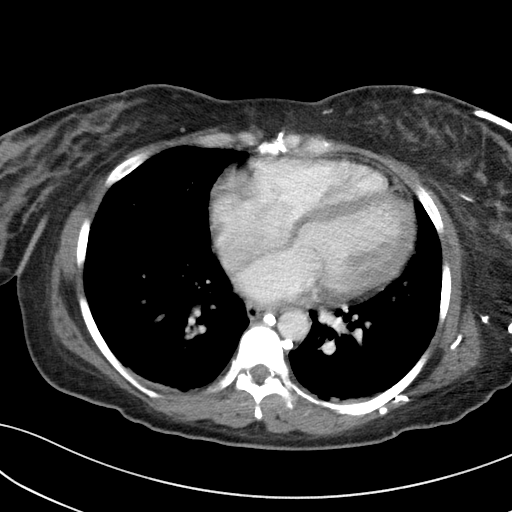
[im 20/53  lung]
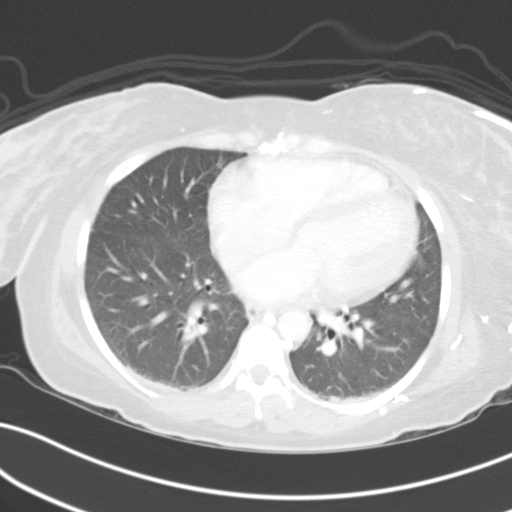
[im 24/53  lung]
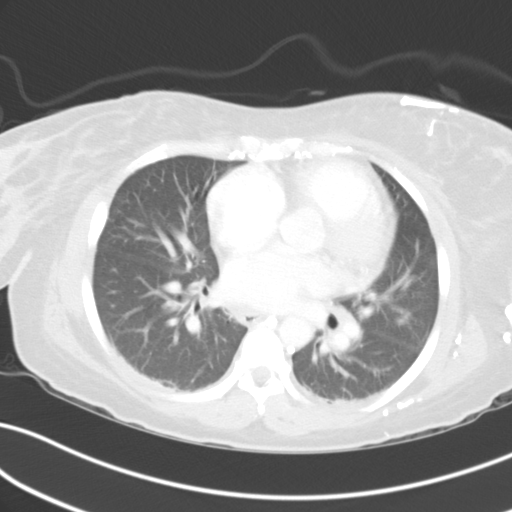
[im 29/53  lung]
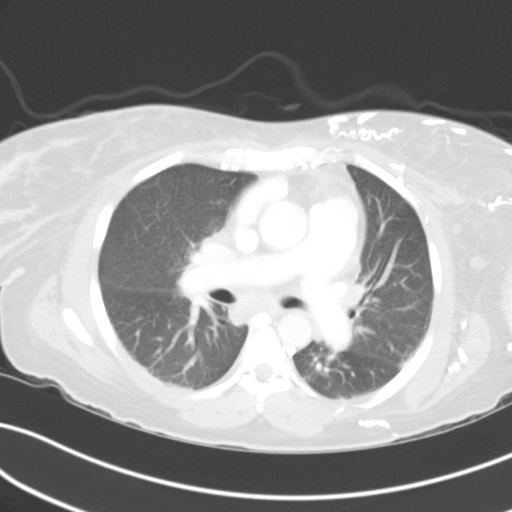
[im 33/53  lung]
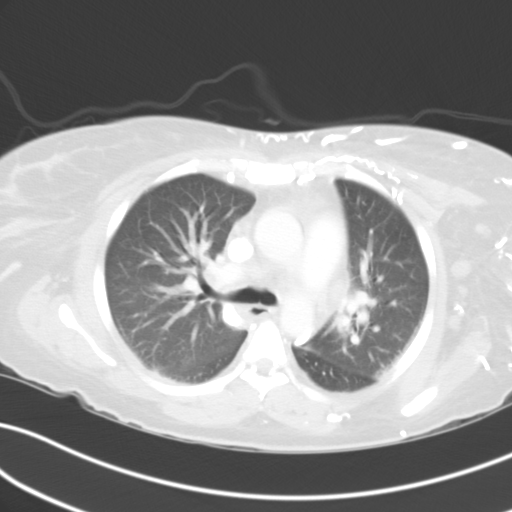
[im 37/53  mediastinal]
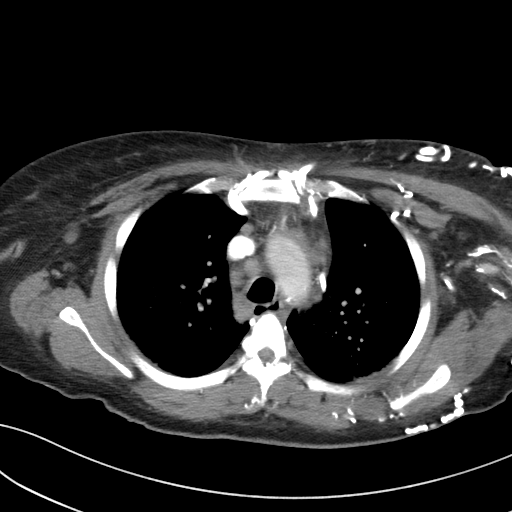
[im 37/53  lung]
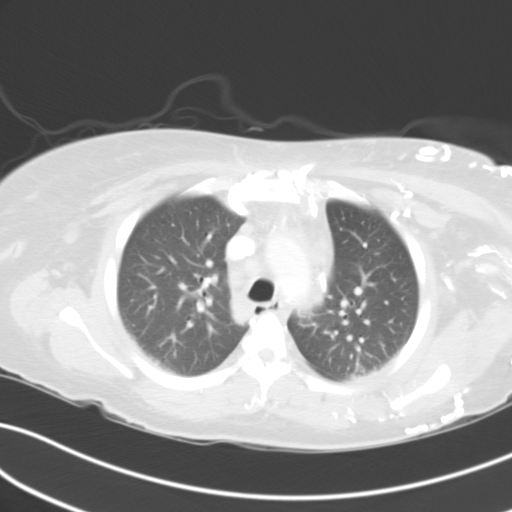
[im 41/53  lung]
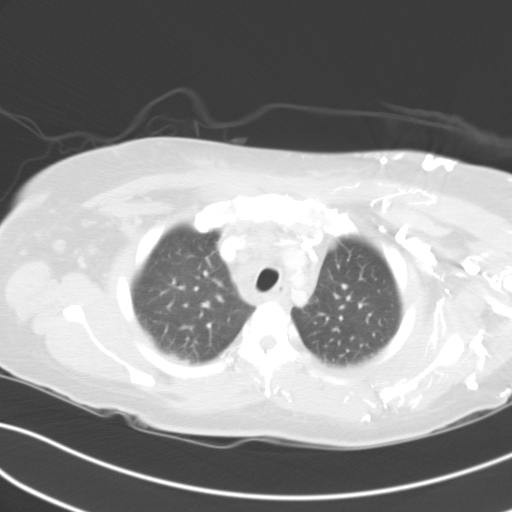
[im 45/53  lung]
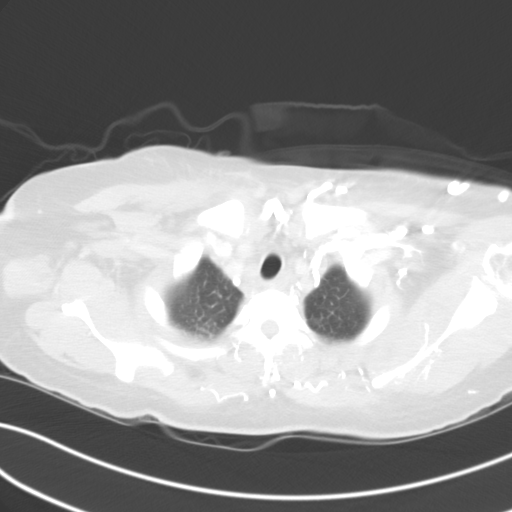
[im 49/53  lung]
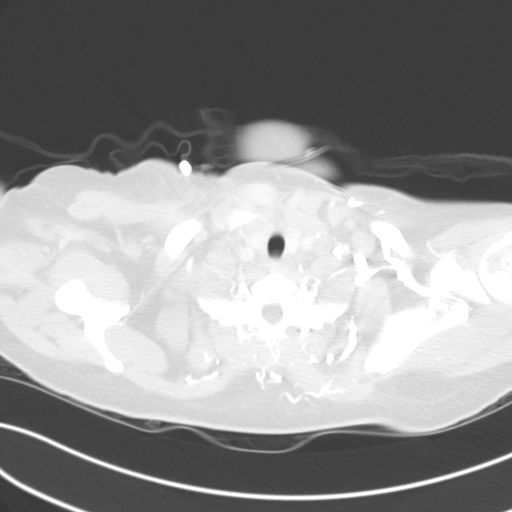

[Series 5: coronal · coronal · 0.61mm/px · 3 of 73 slices shown]
[im 15/73  lung]
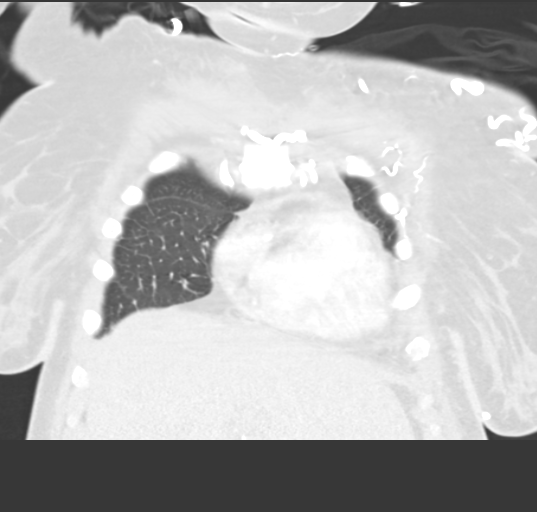
[im 29/73  lung]
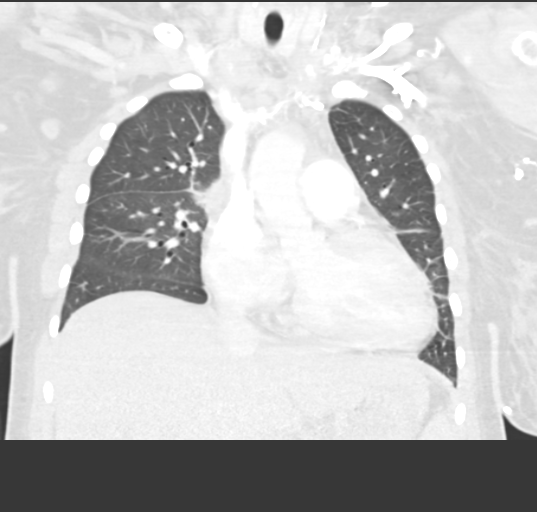
[im 44/73  lung]
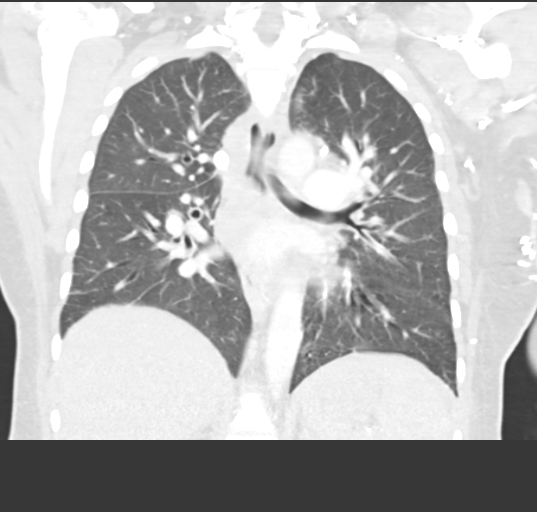

[15 of 36 positions shown; findings below may reference images not displayed]

FINDINGS: The central airways are patent. There is no focal consolidation,
pleural effusion or pneumothorax. There is a 6 mm subpleural left
lower lobe pulmonary nodule (image 37/ series 3).

There is mild bilateral axillary lymphadenopathy. The largest left
axillary lymph node measures 12 mm. The largest right axillary lymph
node measures 12.6 mm. There is mediastinal lymphadenopathy with the
largest right upper paratracheal lymph node measuring 12.5 mm. There
is an enlarged subcarinal lymph node measuring 2 cm. There is
supraclavicular lymphadenopathy with the largest right
supraclavicular lymph node measuring 12 mm. The largest left
infraclavicular lymph node measures 13.8 mm.

There is a spiculated masslike area in the medial aspect of the
right pectoralis major muscle with ill-defined margins and haziness
in the surrounding fat with the overall area measuring 5.3 x 2.3 cm.

The heart size is normal. There is coronary artery atherosclerosis
in the LAD. There are numerous left chest wall collaterals vessels.
There is no pericardial effusion. The thoracic aorta is normal in
caliber.

Review of bone windows demonstrates no focal lytic or sclerotic
lesions.

Limited non-contrast images of the upper abdomen were obtained. The
adrenal glands appear normal. The remainder of the visualized
abdominal organs are unremarkable.
IMPRESSION: 1. Nonspecific bilateral supraclavicular/infraclavicular, axillary
and mediastinal lymphadenopathy. Spiculated masslike area in the
medial aspect of the right pectoralis major muscle with ill-defined
margins. The appearance is most concerning for malignancy until
proven otherwise. Malignancy such as lymphoma or metastatic disease
are the 2 main considerations. Tissue diagnosis recommended.
2. Nonspecific 6 mm subpleural left lower lobe pulmonary nodule. If
the patient is at high risk for bronchogenic carcinoma, follow-up
chest CT at 6-12 months is recommended. If the patient is at low
risk for bronchogenic carcinoma, follow-up chest CT at 12 months is
recommended. This recommendation follows the consensus statement:
Guidelines for Management of Small Pulmonary Nodules Detected on CT
Scans: A Statement from the [HOSPITAL] as published in

## 2017-04-21 IMAGING — CT CT NECK W/ CM
4 series · 14 of 33 positions shown, 17 images · IV contrast (omnipaque)
Comparison: None.

CLINICAL DATA: LEFT neck swelling for 2 weeks, 2 days of RIGHT neck
swelling. Pain. History of diabetes, end-stage renal disease on
dialysis, status post kidney pancreatic transplant.

EXAM:
CT NECK WITH CONTRAST
TECHNIQUE: Multidetector CT imaging of the neck was performed using the
standard protocol following the bolus administration of intravenous
contrast.
CONTRAST:  75mL OMNIPAQUE IOHEXOL 300 MG/ML  SOLN

[Series 3: neck 2.0 i31s 3 · axial · 0.39mm/px · z∈[-186,-70]mm · 5 of 88 slices shown, 7 images]
[im 15/88  soft-tissue]
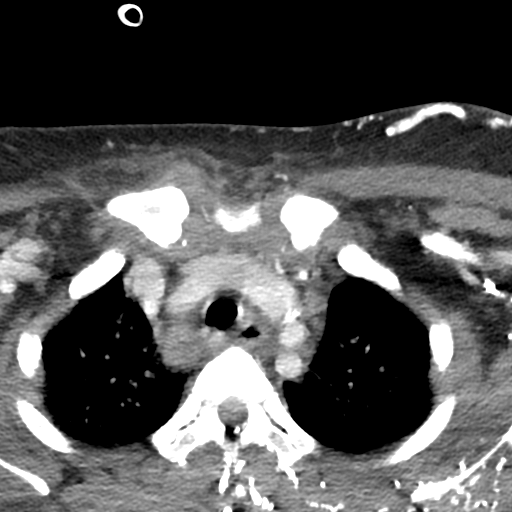
[im 15/88  bone]
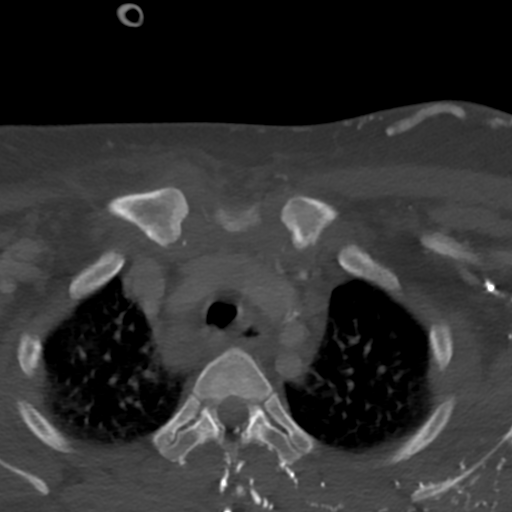
[im 30/88  bone]
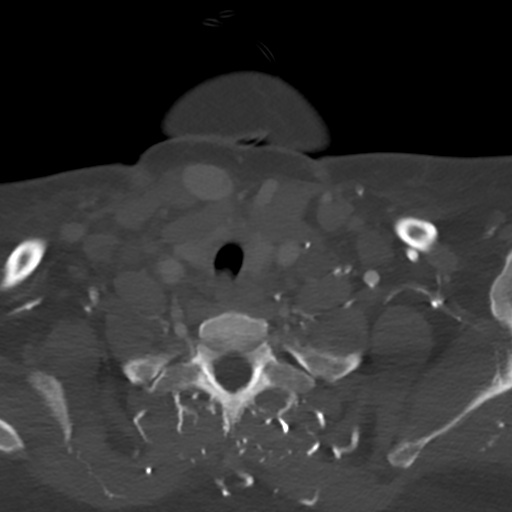
[im 44/88  bone]
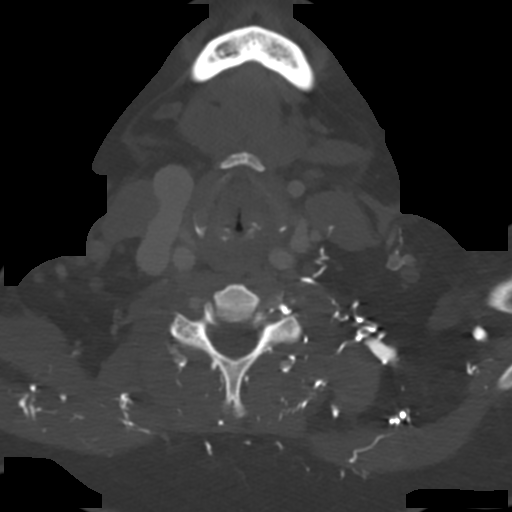
[im 59/88  bone]
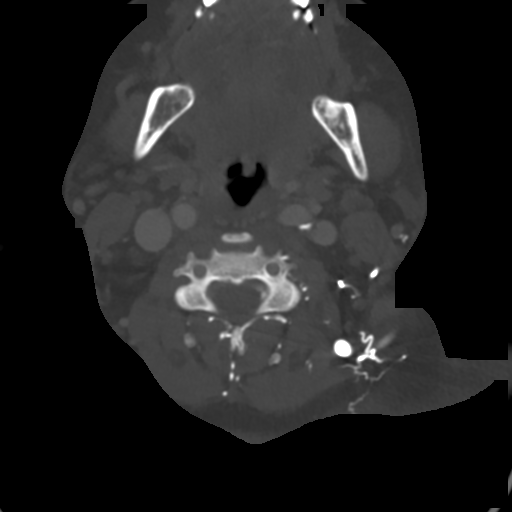
[im 73/88  soft-tissue]
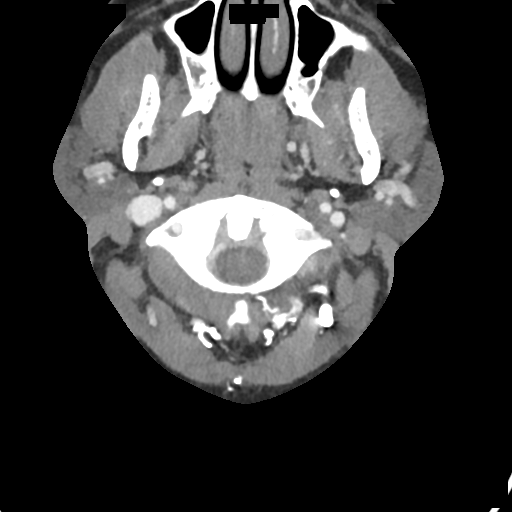
[im 73/88  bone]
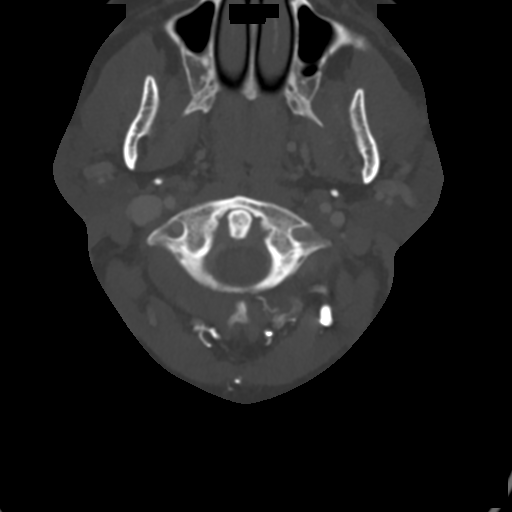

[Series 6: coronal st · coronal · 0.35mm/px · 3 of 95 slices shown]
[im 19/95  bone]
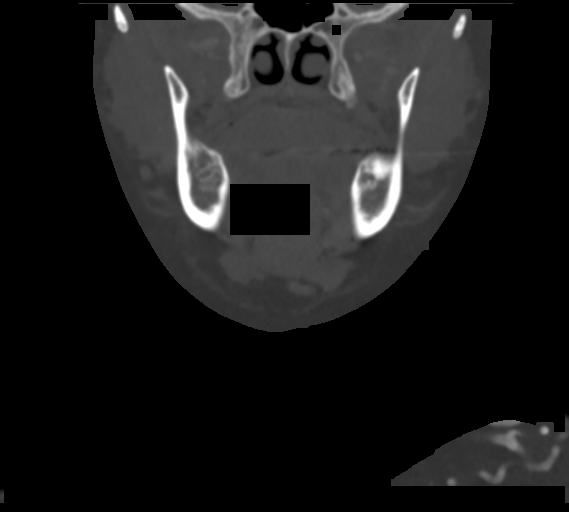
[im 38/95  bone]
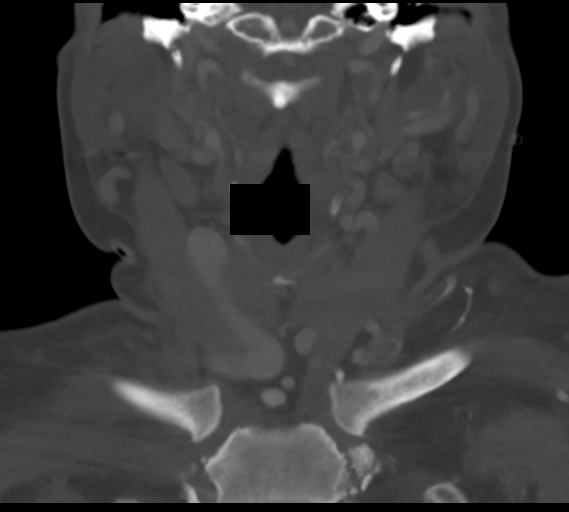
[im 57/95  bone]
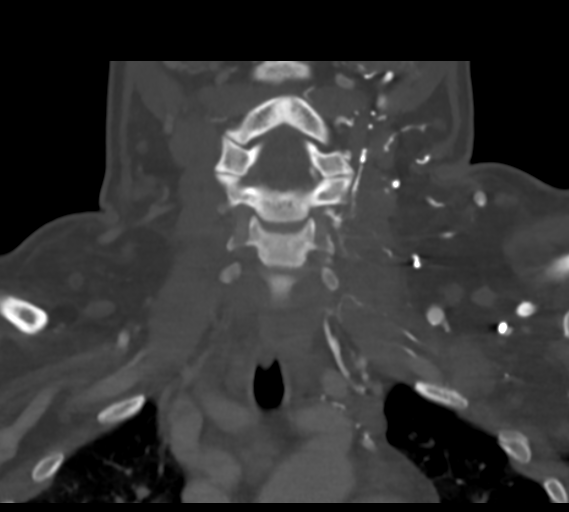

[Series 7: sagittal st · sagittal · 0.35mm/px · 5 of 90 slices shown, 6 images]
[im 30/90  bone]
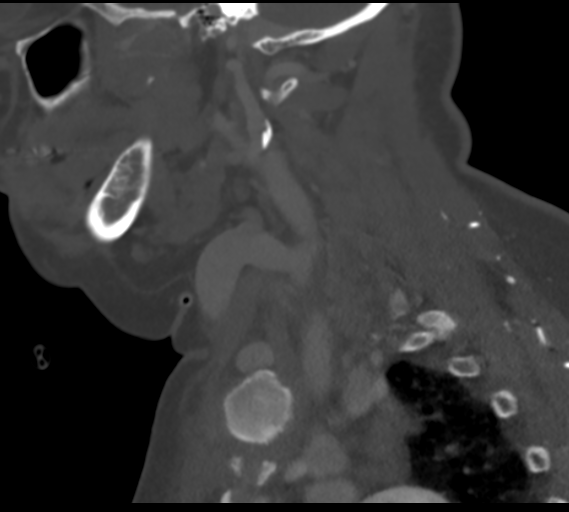
[im 38/90  bone]
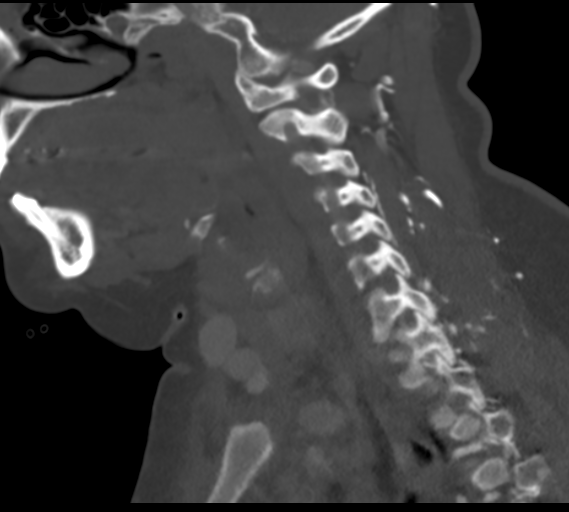
[im 45/90  soft-tissue]
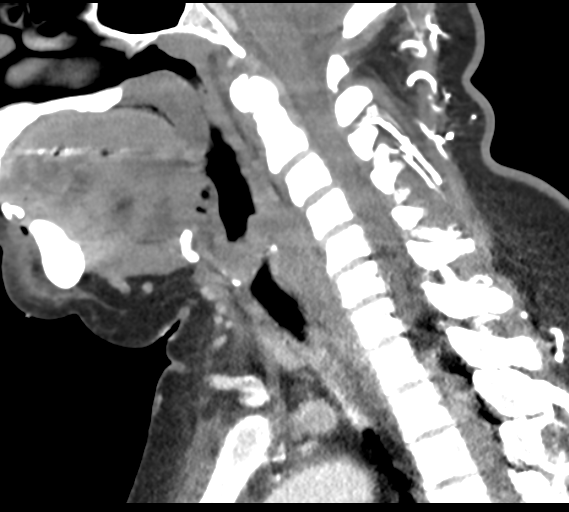
[im 45/90  bone]
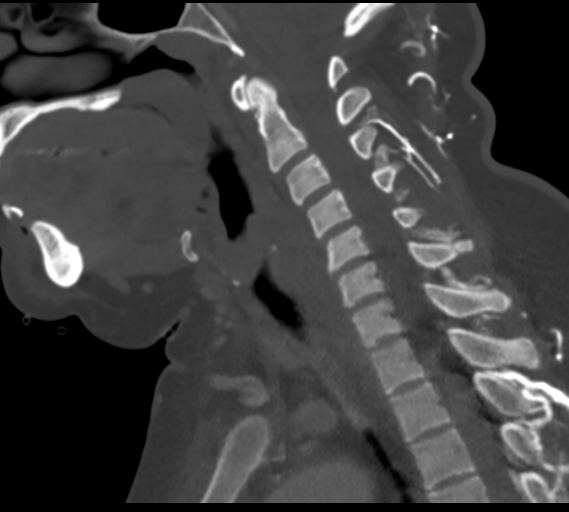
[im 52/90  bone]
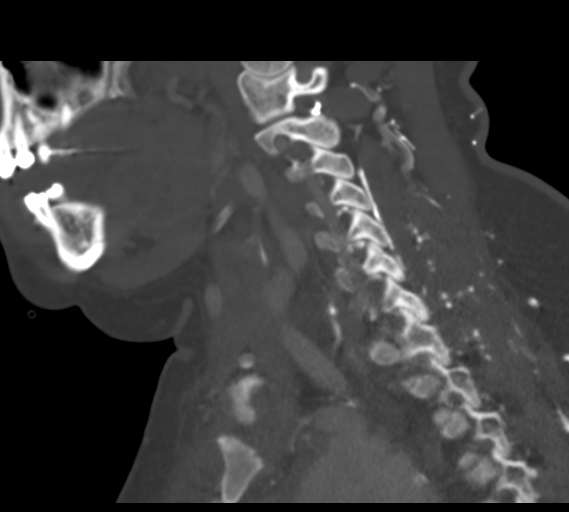
[im 60/90  bone]
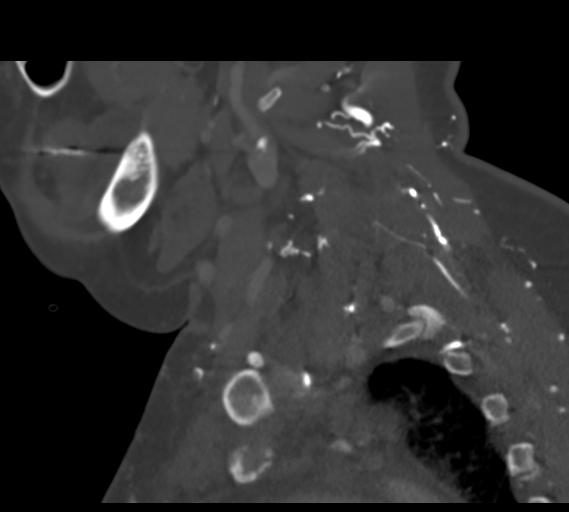

[Series 8: orthogonal st · axial · 0.27mm/px · 1 of 101 slices shown]
[im 17/101  bone]
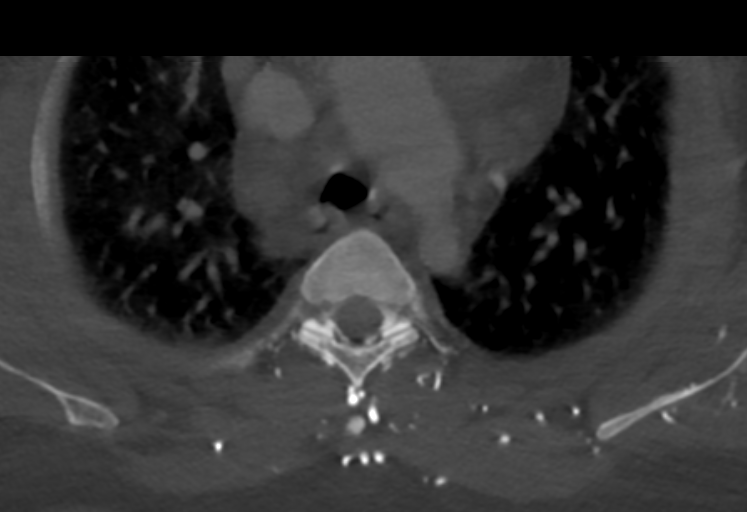

[14 of 33 positions shown; findings below may reference images not displayed]

FINDINGS: Pharynx and larynx: Prominent uvula partially effacing the airway,
palatine tonsils are not enlarged or inflamed. Small retropharyngeal
effusion without discrete abscess. Mild hypo pharyngeal edema. No
abscess. Larynx is normal.

Salivary glands: Normal.

Thyroid: Normal.

Lymph nodes: Lymphadenopathy throughout the neck, including round
LEFT supraclavicular 13 mm short axis lymph node, 9 mm short axis
RIGHT paratracheal lymph node. Rounded RIGHT level IIa 10 mm lymph
node.

Vascular: Moderate calcific atherosclerosis of the carotid bulbs,
advanced for age. Chronically occluded bilateral internal jugular
veins at the lower neck. Enlarged patent bilateral external jugular
veins.

Limited intracranial: Normal.

Visualized orbits: Normal.

Mastoids and visualized paranasal sinuses: Well aerated.

Skeleton: Sclerotic axial skeleton, likely representing renal
osteodystrophy without destructive bony lesions. Multiple absent
teeth.

Upper chest: Pulmonary vascular congestion, suspected cardiomegaly.
Partially imaged mediastinal lymphadenopathy.
IMPRESSION: Mild pharyngitis. Prominent uvula partially effaces the airway.
Small retropharyngeal effusion without abscess.

Mild lymphadenopathy throughout the neck, partially imaged
mediastinal lymphadenopathy for which dedicated CT of the chest with
contrast is recommended. Partially imaged cardiomegaly and pulmonary
vascular congestion.

Chronically occluded bilateral internal jugular veins, enlarged
patent external jugular veins.

## 2017-04-22 IMAGING — CT CT ABD-PELV W/ CM
2 of 5 series · 15 of 46 positions shown, 17 images · IV contrast (Omni 300)
Comparison: Recent neck and chest CT examinations. Abdomen and
pelvis CT dated 11/26/2013.

CLINICAL DATA: Adenopathy on a recent chest CT. The patient
complains of right neck swelling with mild adenopathy in the neck on
a recent neck CT.

EXAM:
CT ABDOMEN AND PELVIS WITH CONTRAST
TECHNIQUE: Multidetector CT imaging of the abdomen and pelvis was performed
using the standard protocol following bolus administration of
intravenous contrast.
CONTRAST:  100mL OMNIPAQUE IOHEXOL 300 MG/ML  SOLN

[Series 2: a/p w/ 5mm · axial · 0.74mm/px · z∈[+1066,+1472]mm · 12 of 93 slices shown, 14 images]
[im 6/93  soft-tissue]
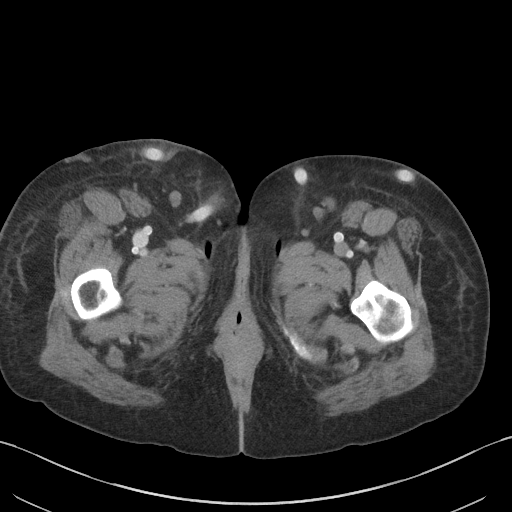
[im 6/93  bone]
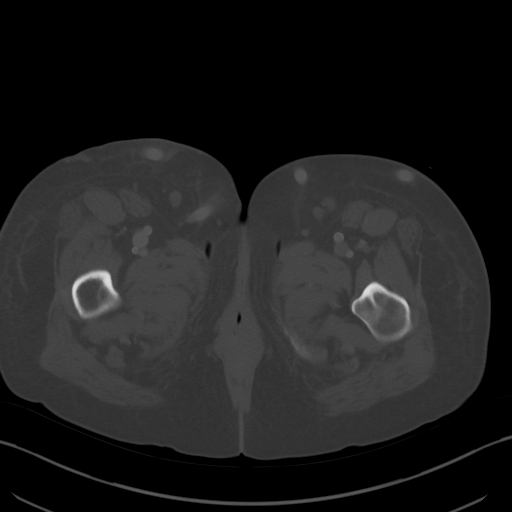
[im 16/93  soft-tissue]
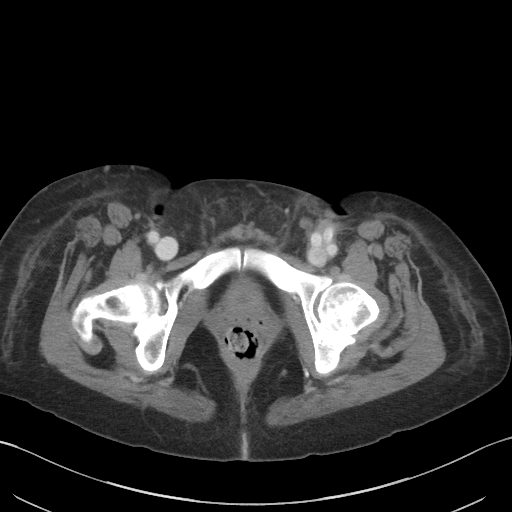
[im 21/93  soft-tissue]
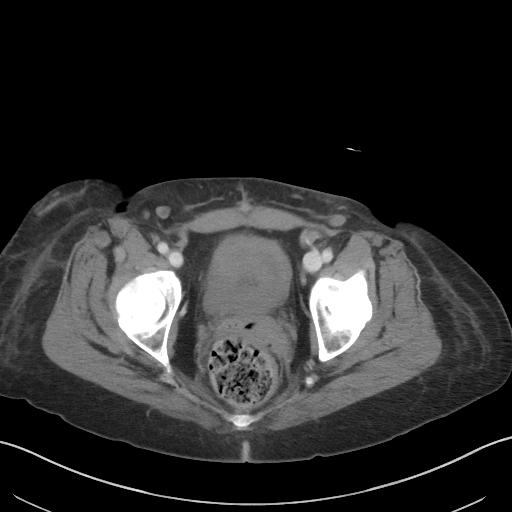
[im 26/93  soft-tissue]
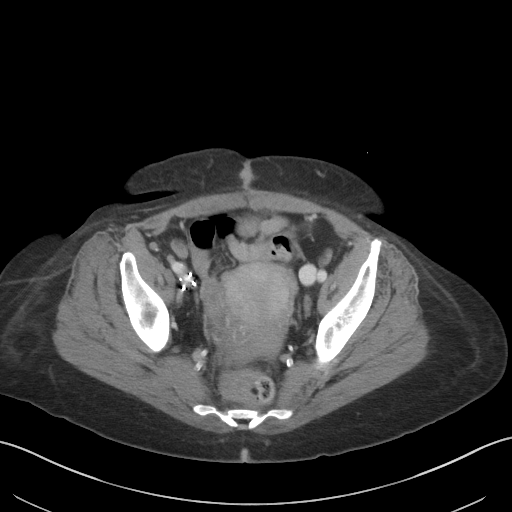
[im 36/93  soft-tissue]
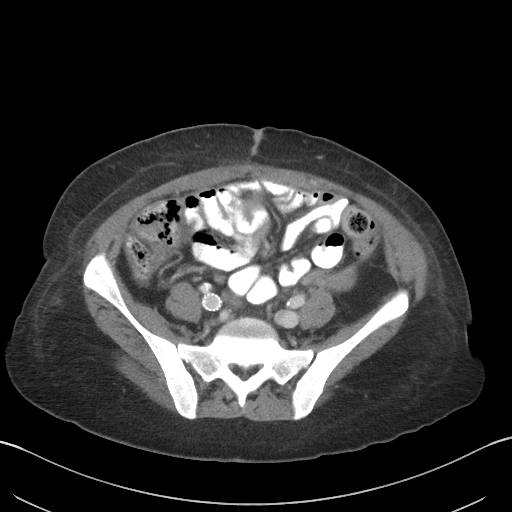
[im 41/93  soft-tissue]
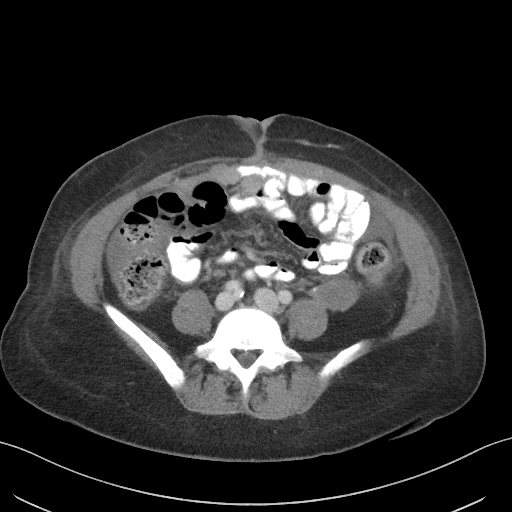
[im 52/93  soft-tissue]
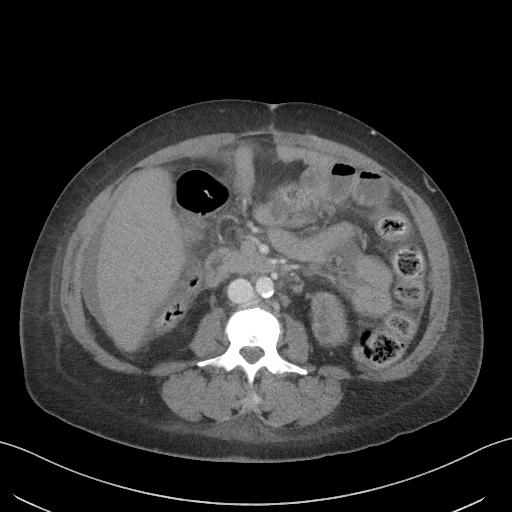
[im 57/93  soft-tissue]
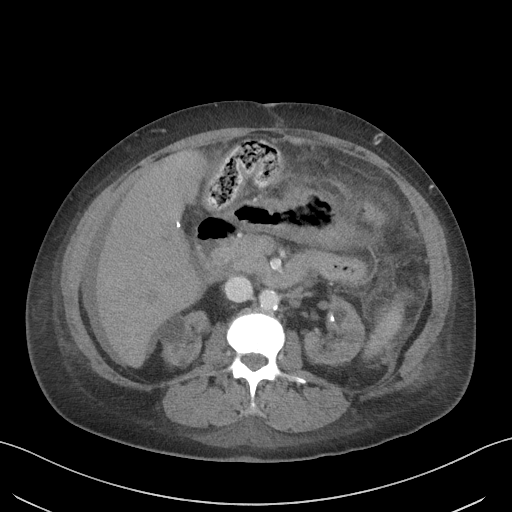
[im 67/93  soft-tissue]
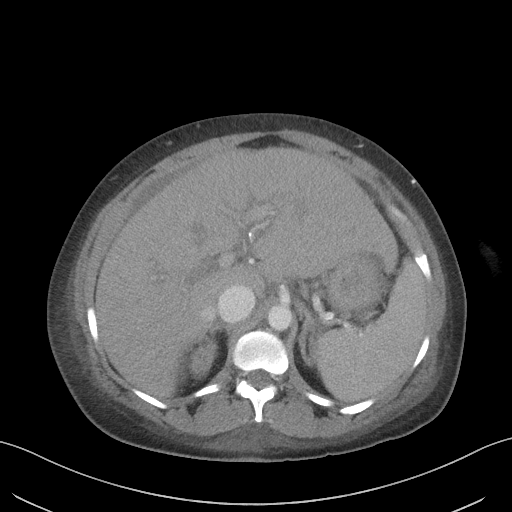
[im 67/93  bone]
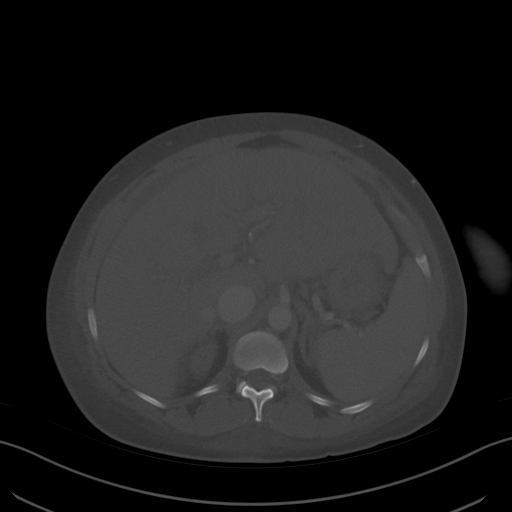
[im 72/93  soft-tissue]
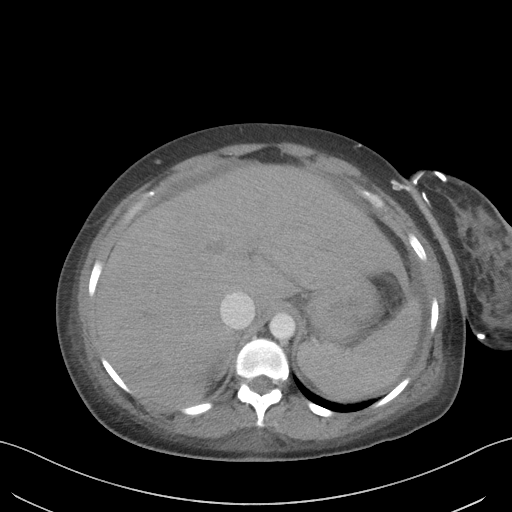
[im 77/93  soft-tissue]
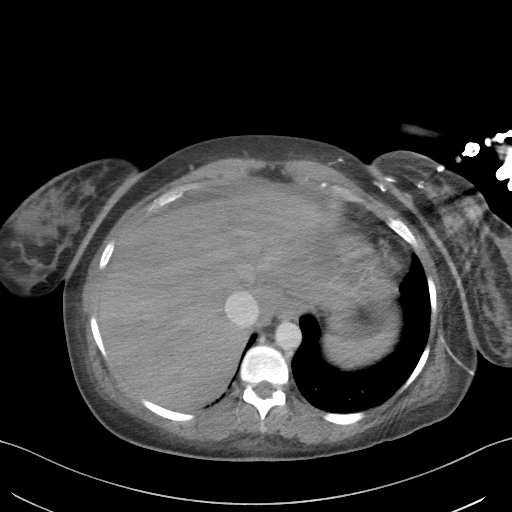
[im 87/93  soft-tissue]
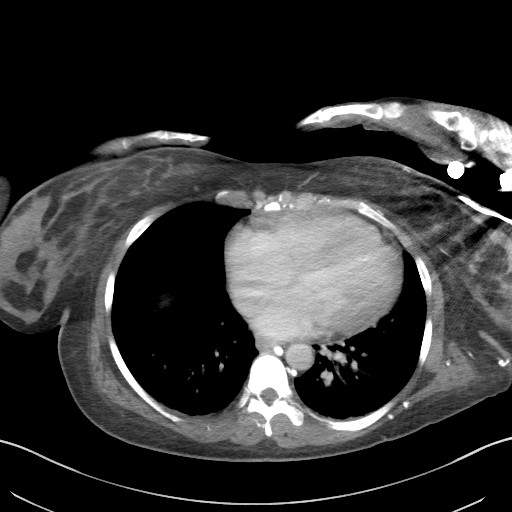

[Series 5: a/p w/ cor · coronal · 0.77mm/px · 3 of 125 slices shown]
[im 42/125  soft-tissue]
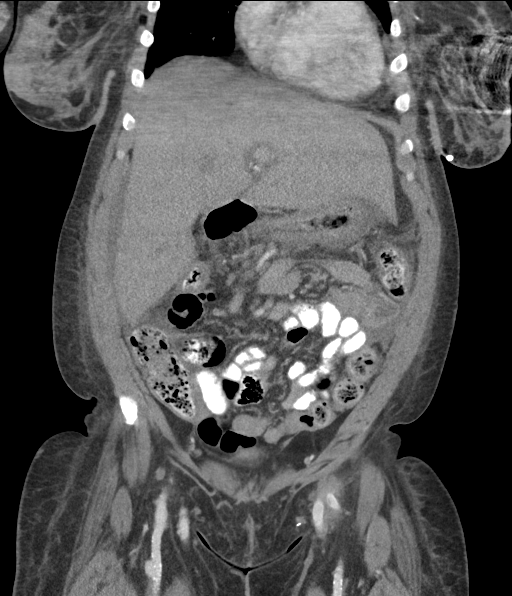
[im 56/125  soft-tissue]
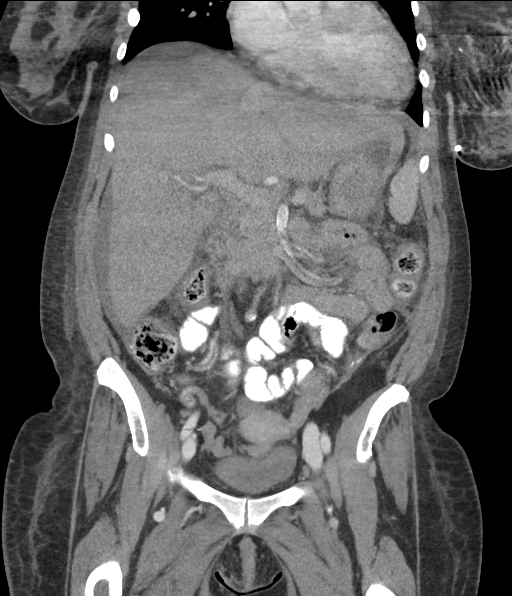
[im 69/125  soft-tissue]
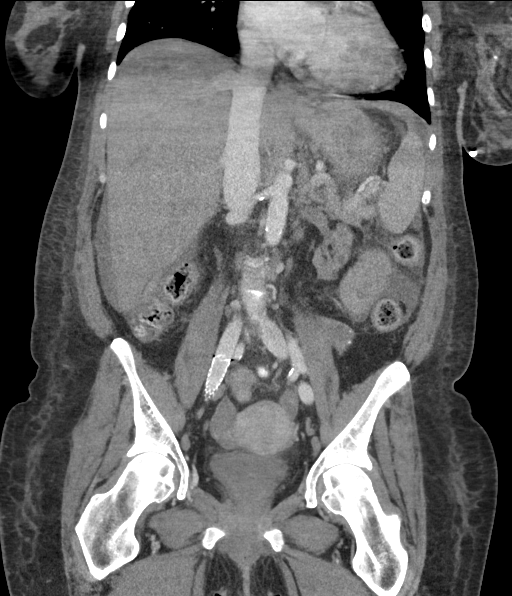

[15 of 46 positions shown; findings below may reference images not displayed]

FINDINGS: Small amount of free peritoneal fluid. The lateral segment of the
left lobe and the caudate lobe of the liver are enlarged and the
right lobe is somewhat small. Normal sized spleen.

Oval, mass-like area in the left lower abdomen and upper pelvis,
measuring 4.0 x 2.3 cm on image number 54 and 4.4 cm in length on
sagittal image number 119. This has a peripheral medium density
component containing calcifications and is lower in density
centrally. This has been present on multiple previous examinations
and is slightly smaller than on 09/14/2010.

Right common and external iliac artery stent. Bilateral femoral
artery bypass grafts. The grafts appear patent. Cholecystectomy
clips. Poor distention of the stomach with diffuse wall thickening.
No small bowel or colonic abnormalities. No evidence of
appendicitis.

Mildly prominent bilateral common iliac lymph nodes with mild
progression on the right. The large node on the right measures 9 mm
in short axis diameter on image number 68. This measured 8 mm in
short axis diameter on 11/26/2013. Mildly prominent bilateral
internal iliac lymph nodes are unchanged. Minimally prominent
retroperitoneal nodes without abnormal enlargement.

Small kidneys containing multiple cysts. Atheromatous arterial
calcifications. Unremarkable uterus and ovaries. The pancreas and
adrenal glands are also unremarkable. Minimal dependent atelectasis
at both lung bases. Unremarkable bones.
IMPRESSION: 1. Interval small amount of ascites in the abdomen and pelvis.
2. Probable early changes of cirrhosis of the liver.
3. Poor distention of the stomach with diffuse wall thickening. The
wall thickening could be due to underdistention, gastritis or
diffuse neoplastic involvement, with differential considerations
including gastric carcinoma and lymphoma.
4. Borderline enlarged bilateral common iliac lymph nodes with mild
progression, specially on the right. Otherwise, no adenopathy.
5. Bilateral renal atrophy and multiple cysts.
6. Stable to slightly decreased in size of a probable old left renal
transplant.

## 2017-04-23 IMAGING — DX DG CHEST 2V
2 series · 2 of 2 positions shown · non-contrast
Comparison: CT 03/31/2015, chest x-ray 01/05/2015

CLINICAL DATA: 44-year-old female with a history of chest pain

EXAM:
CHEST - 2 VIEW

[chest pa]
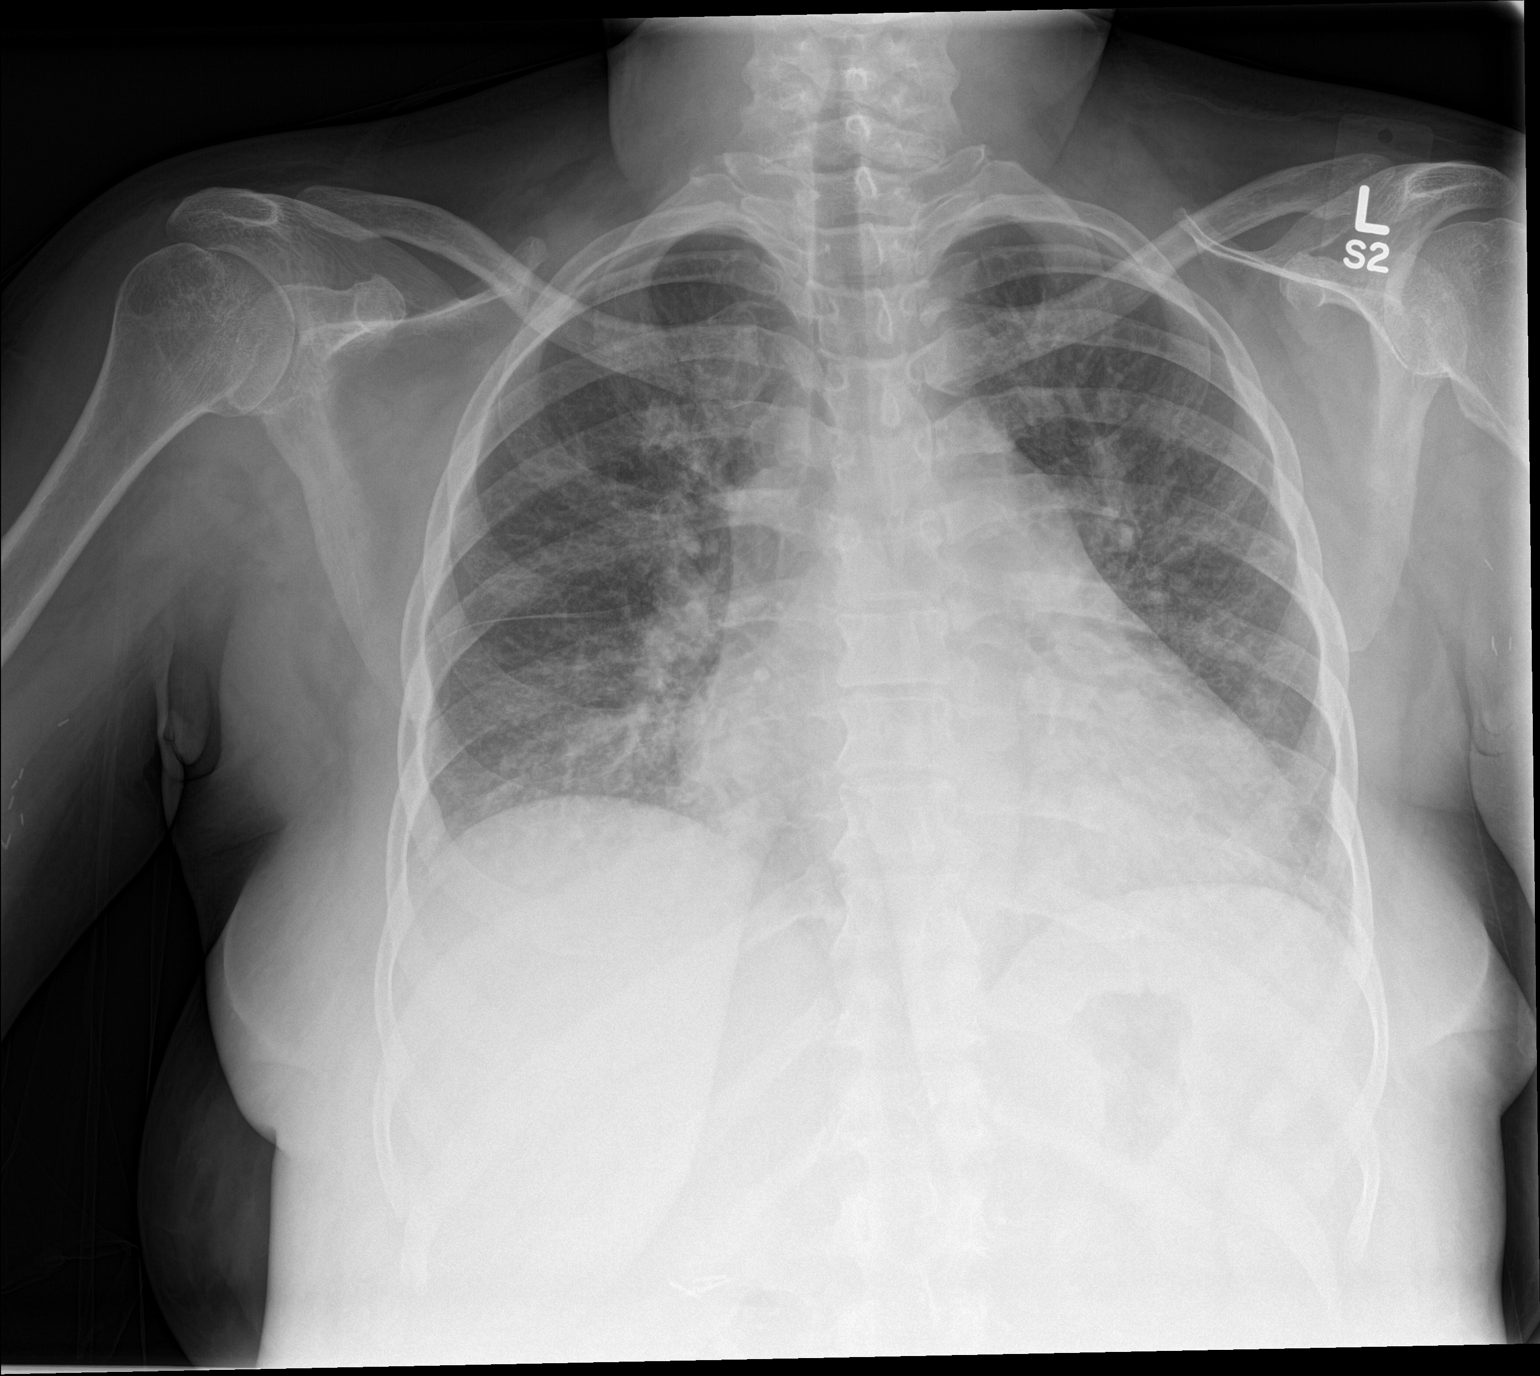

[chest lat]
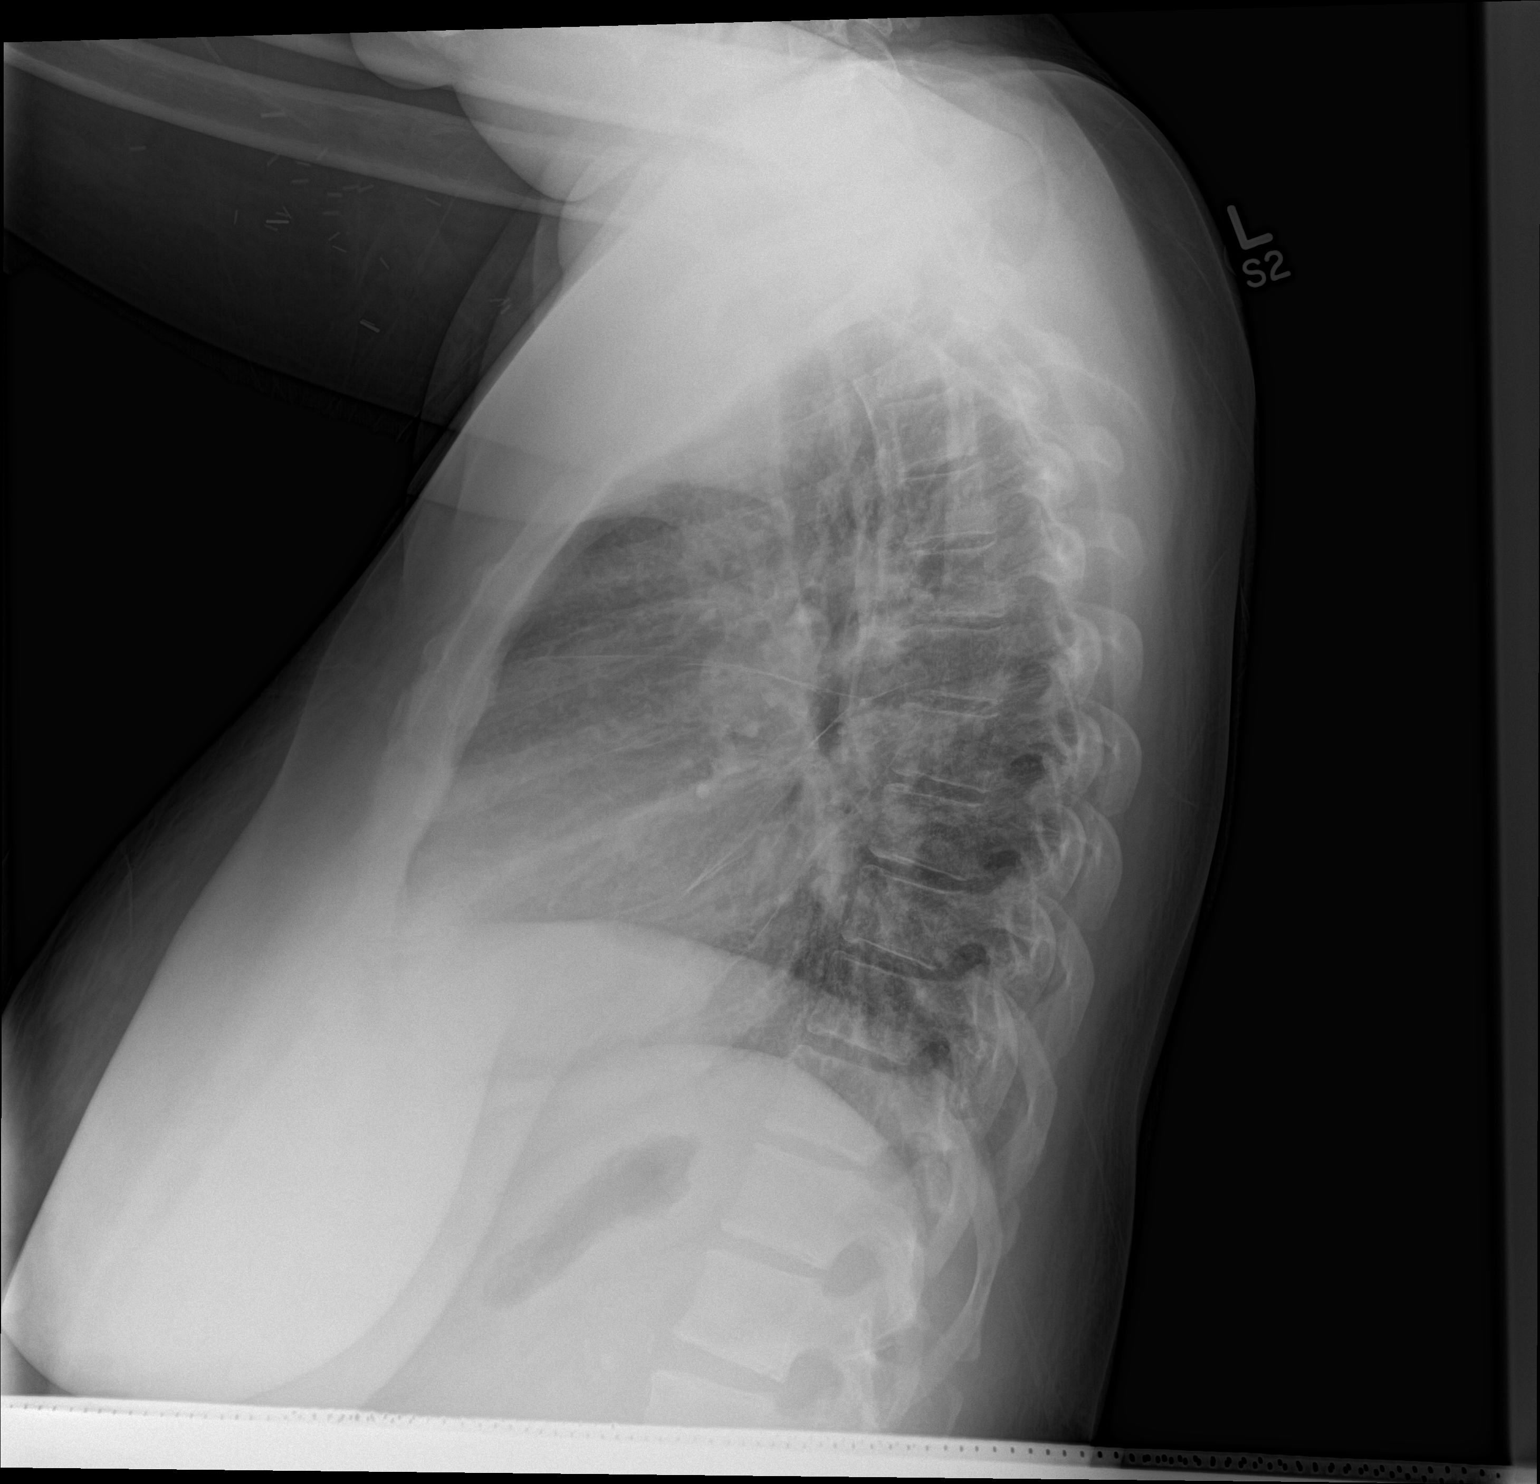

[2 of 2 positions shown; findings below may reference images not displayed]

FINDINGS: Cardiomediastinal silhouette unchanged.

Low lung volumes with interstitial opacities bilaterally and patchy
airspace opacities.

No pneumothorax.  No pleural effusion.

No displaced fracture.

Unremarkable appearance of the upper abdomen.
IMPRESSION: Interstitial opacities and patchy airspace opacities, potentially
reflecting developing edema, consolidation, or atelectasis given the
low lung volumes.

## 2017-04-25 IMAGING — CR DG CHEST 2V
2 series · 2 of 2 positions shown · non-contrast
Comparison: 04/02/2015

CLINICAL DATA: Cough, weakness, and fever for a few days.

EXAM:
CHEST  2 VIEW

[chest pa]
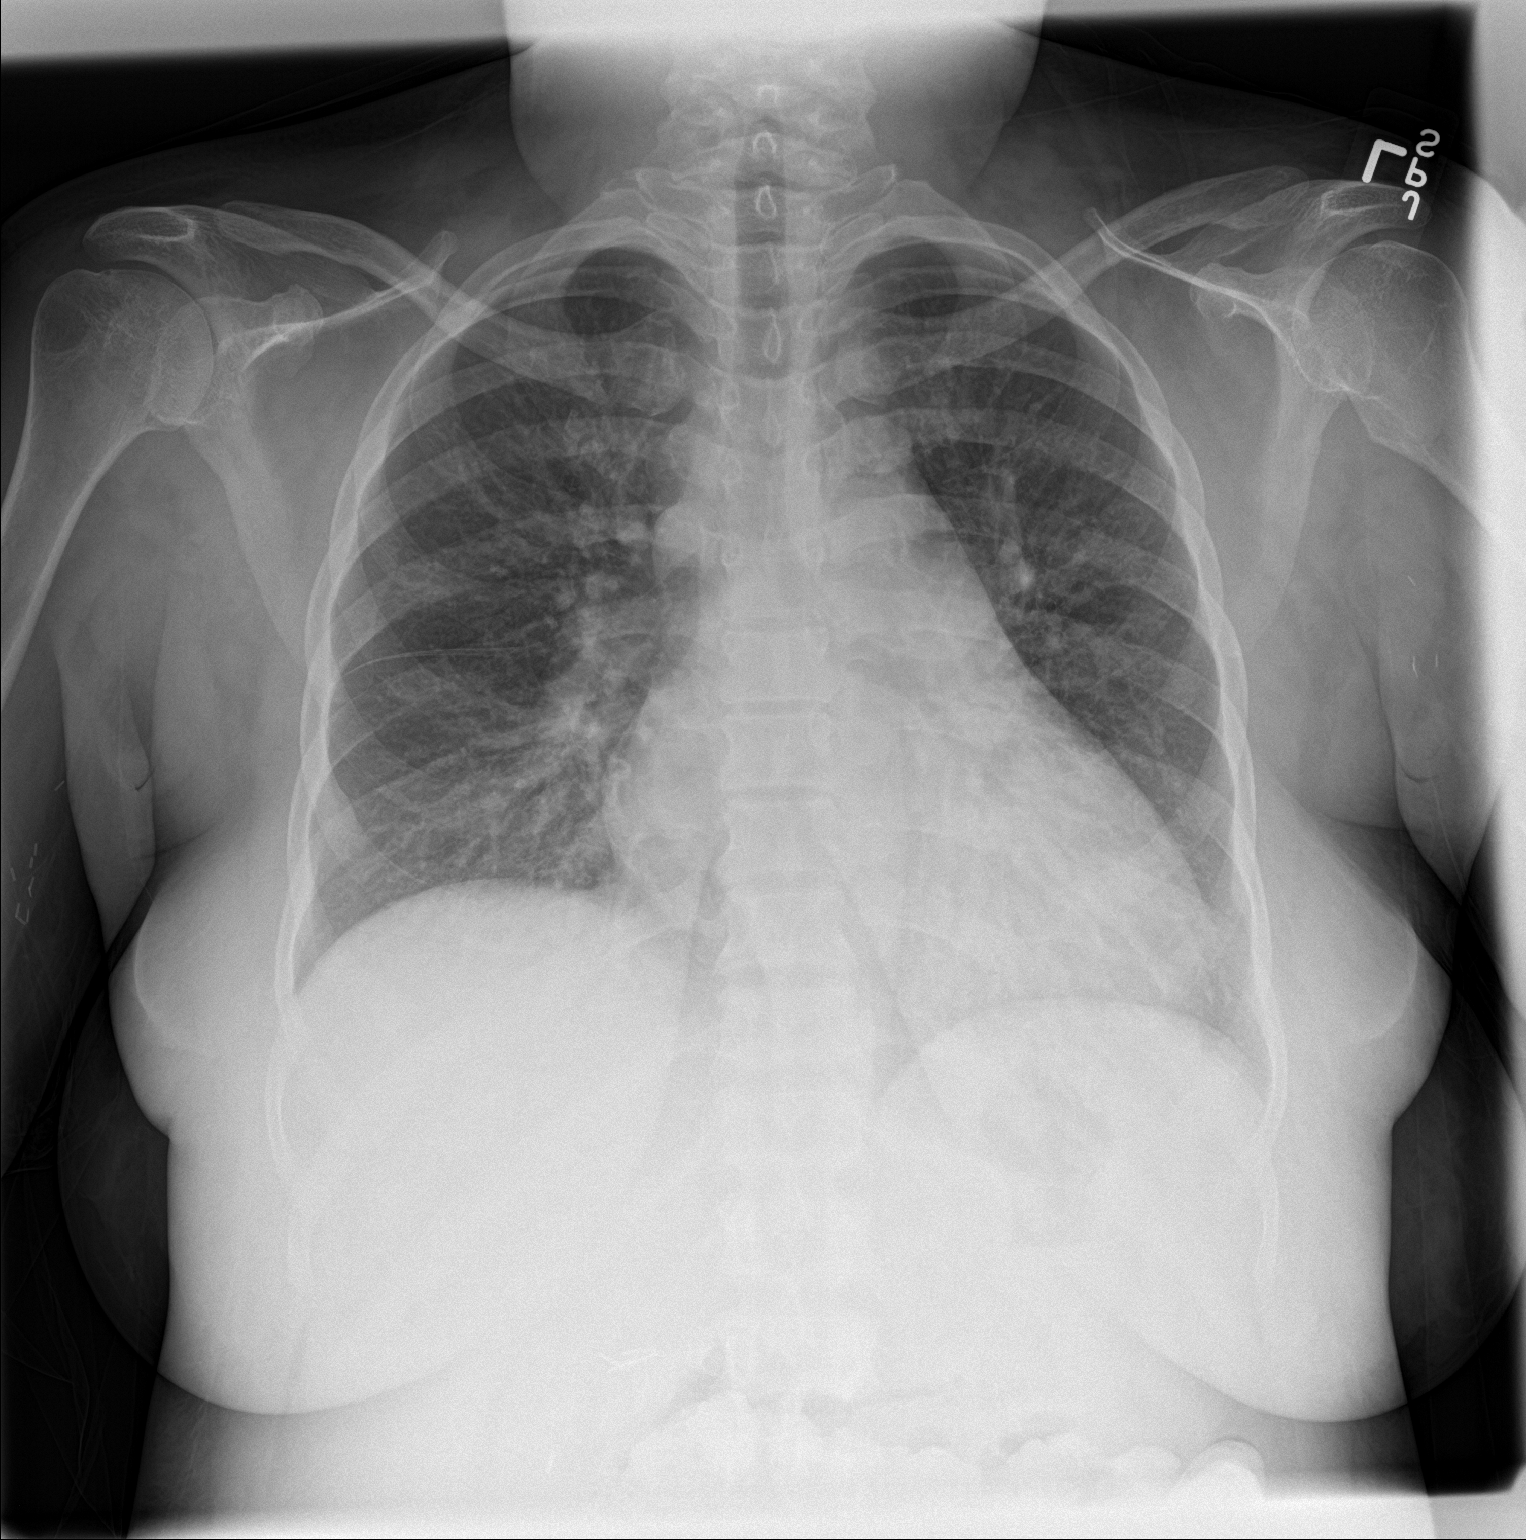

[chest lat]
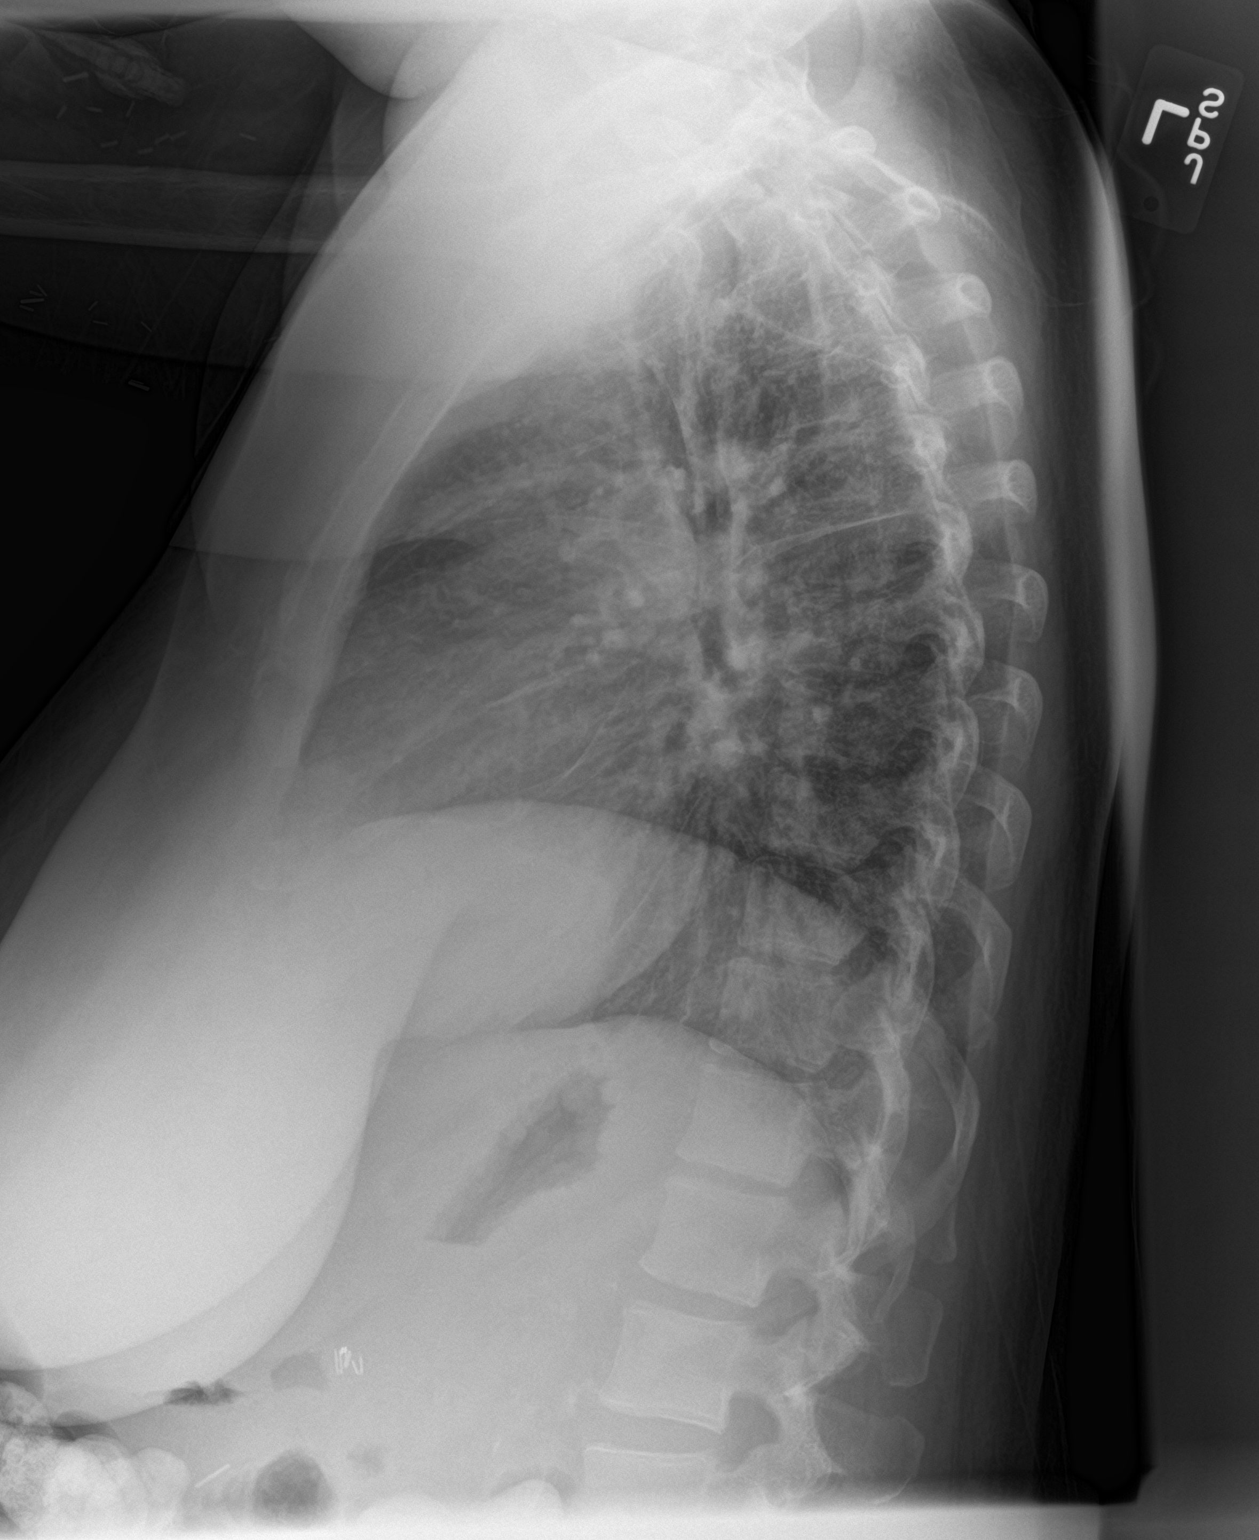

[2 of 2 positions shown; findings below may reference images not displayed]

FINDINGS: The cardiac silhouette is mildly enlarged, unchanged. There is
persistent mild elevation of the right hemidiaphragm. The patient
has taken a greater inspiration than on the prior study. There is
slightly improved aeration of the lung bases with persistent mild
basilar opacities and mild interstitial prominence bilaterally. No
pneumothorax or pleural effusion is seen. Right upper quadrant
abdominal surgical clips are noted. No acute osseous abnormality is
identified.
IMPRESSION: Greater inspiration with improved aeration of the lung bases.
Residual mild bibasilar opacities may reflect atelectasis, mild
interstitial edema, or infection.

## 2017-11-18 IMAGING — DX DG CHEST 1V PORT
1 series · 1 of 1 positions shown · non-contrast
Comparison: 04/04/2015

CLINICAL DATA: Status post CPR with endotracheal tube placement

EXAM:
PORTABLE CHEST 1 VIEW

[chest ap]
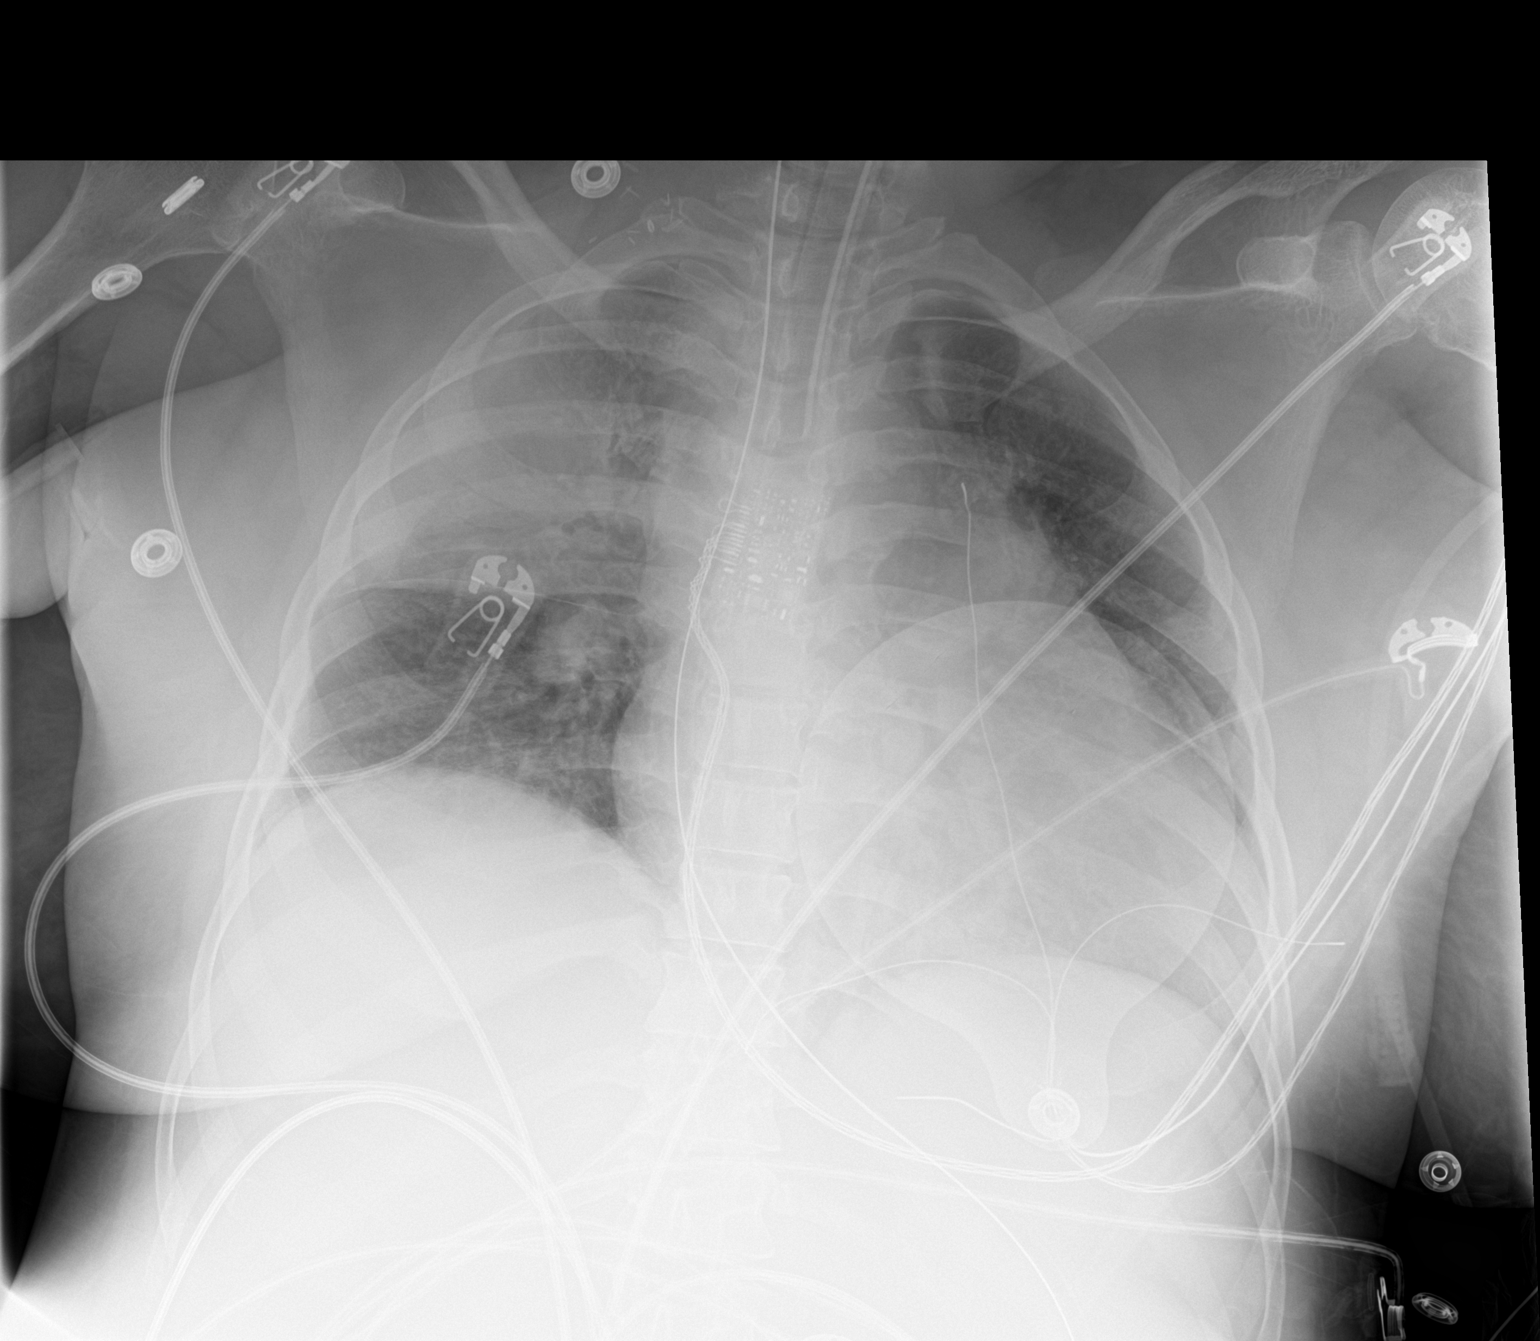

[1 of 1 positions shown; findings below may reference images not displayed]

FINDINGS: Endotracheal tube tip about 2.5 cm above the carina. NG tube crosses
the gastroesophageal junction.

Right upper lobe is diffusely consolidated, new from the prior
study. Lungs otherwise clear. Stable mild cardiac enlargement.
IMPRESSION: Support devices as described.

New diffuse right upper lobe consolidation.

## 2017-11-18 IMAGING — CR DG CHEST 1V PORT
1 series · 1 of 1 positions shown · non-contrast
Comparison: 10/28/2015 at 0393 hours

CLINICAL DATA: Central line placement

EXAM:
PORTABLE CHEST 1 VIEW

[AP]
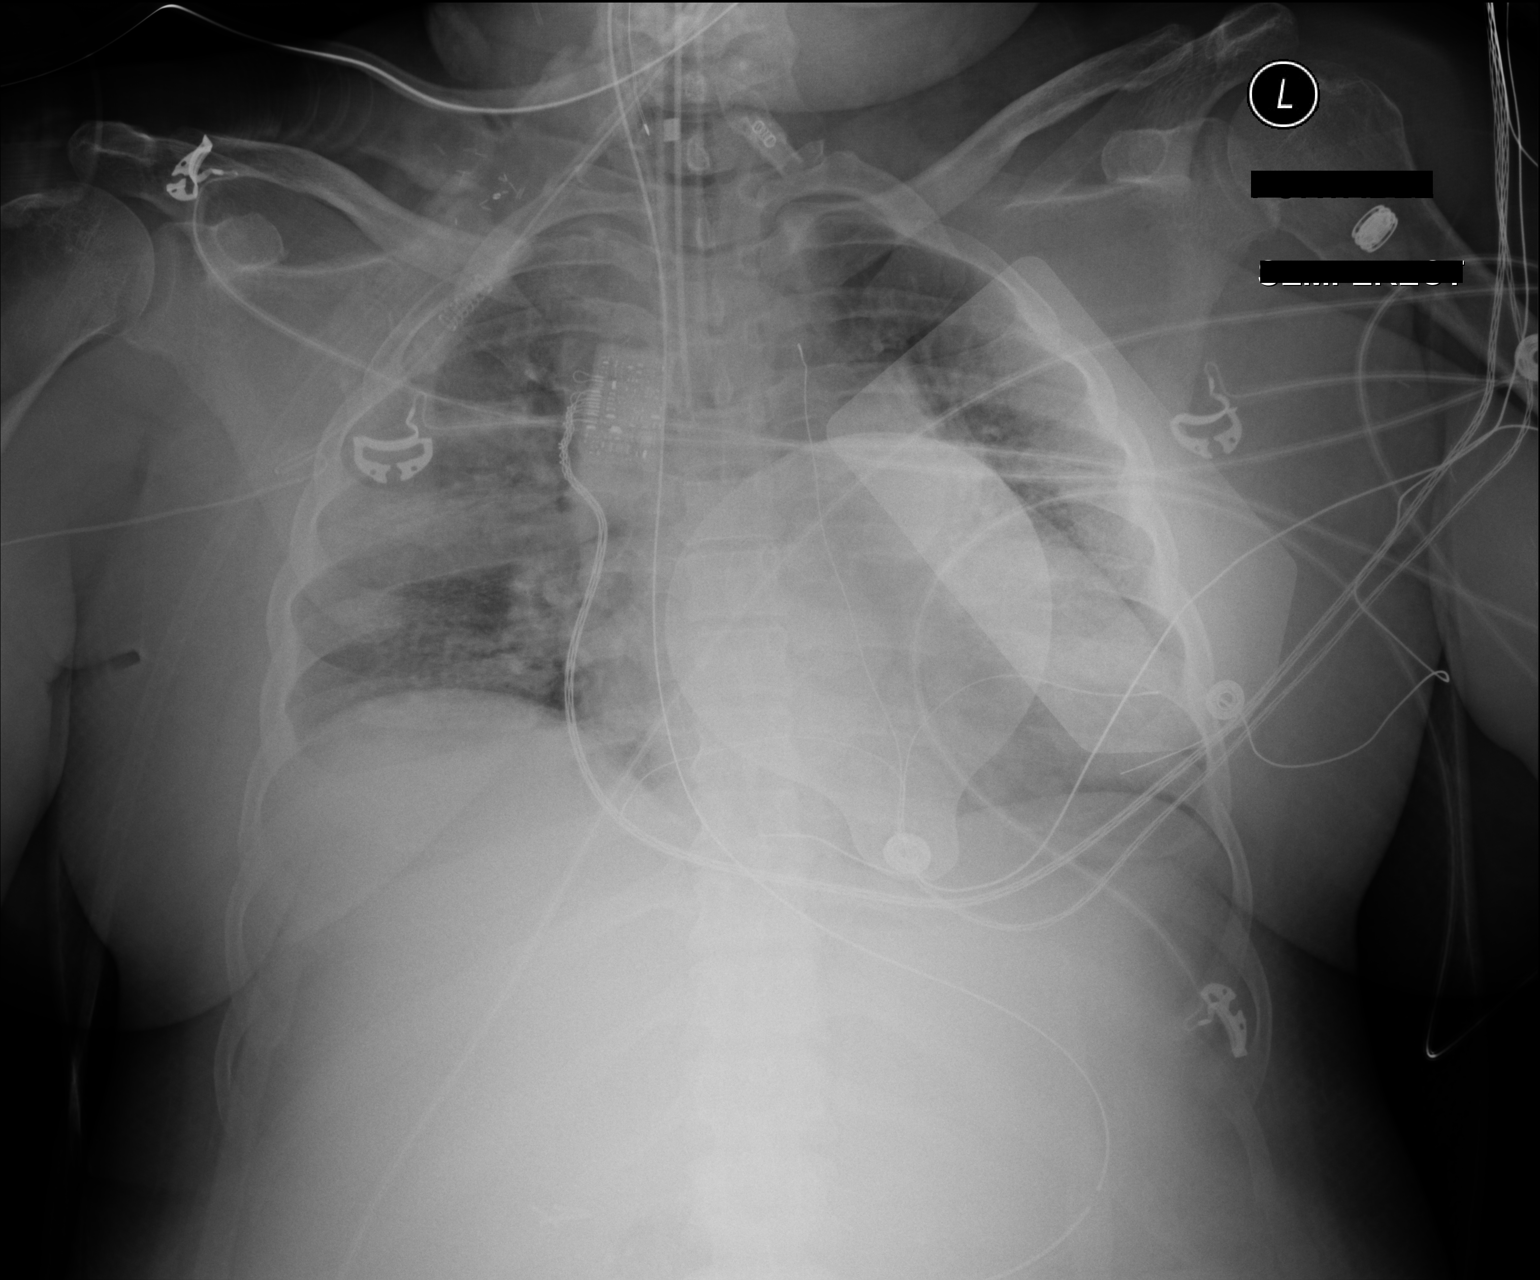

[1 of 1 positions shown; findings below may reference images not displayed]

FINDINGS: Endotracheal tube with tip 11 mm above the carina. NG tube crosses
the gastroesophageal junction. No definite central line identified.
Numerous devices project over the patient's and lower cervical
region on the right. Persistent but improved right upper lobe
consolidation.
IMPRESSION: Central line not definitively identified. Correlate clinically and
[facility] subsequent radiographs to examine the anticipated area where
central line should be expected.

## 2017-11-18 IMAGING — CR DG ABD PORTABLE 1V
1 series · 1 of 1 positions shown · non-contrast
Comparison: None.

CLINICAL DATA: Nasogastric tube placement

EXAM:
PORTABLE ABDOMEN - 1 VIEW

[AP]
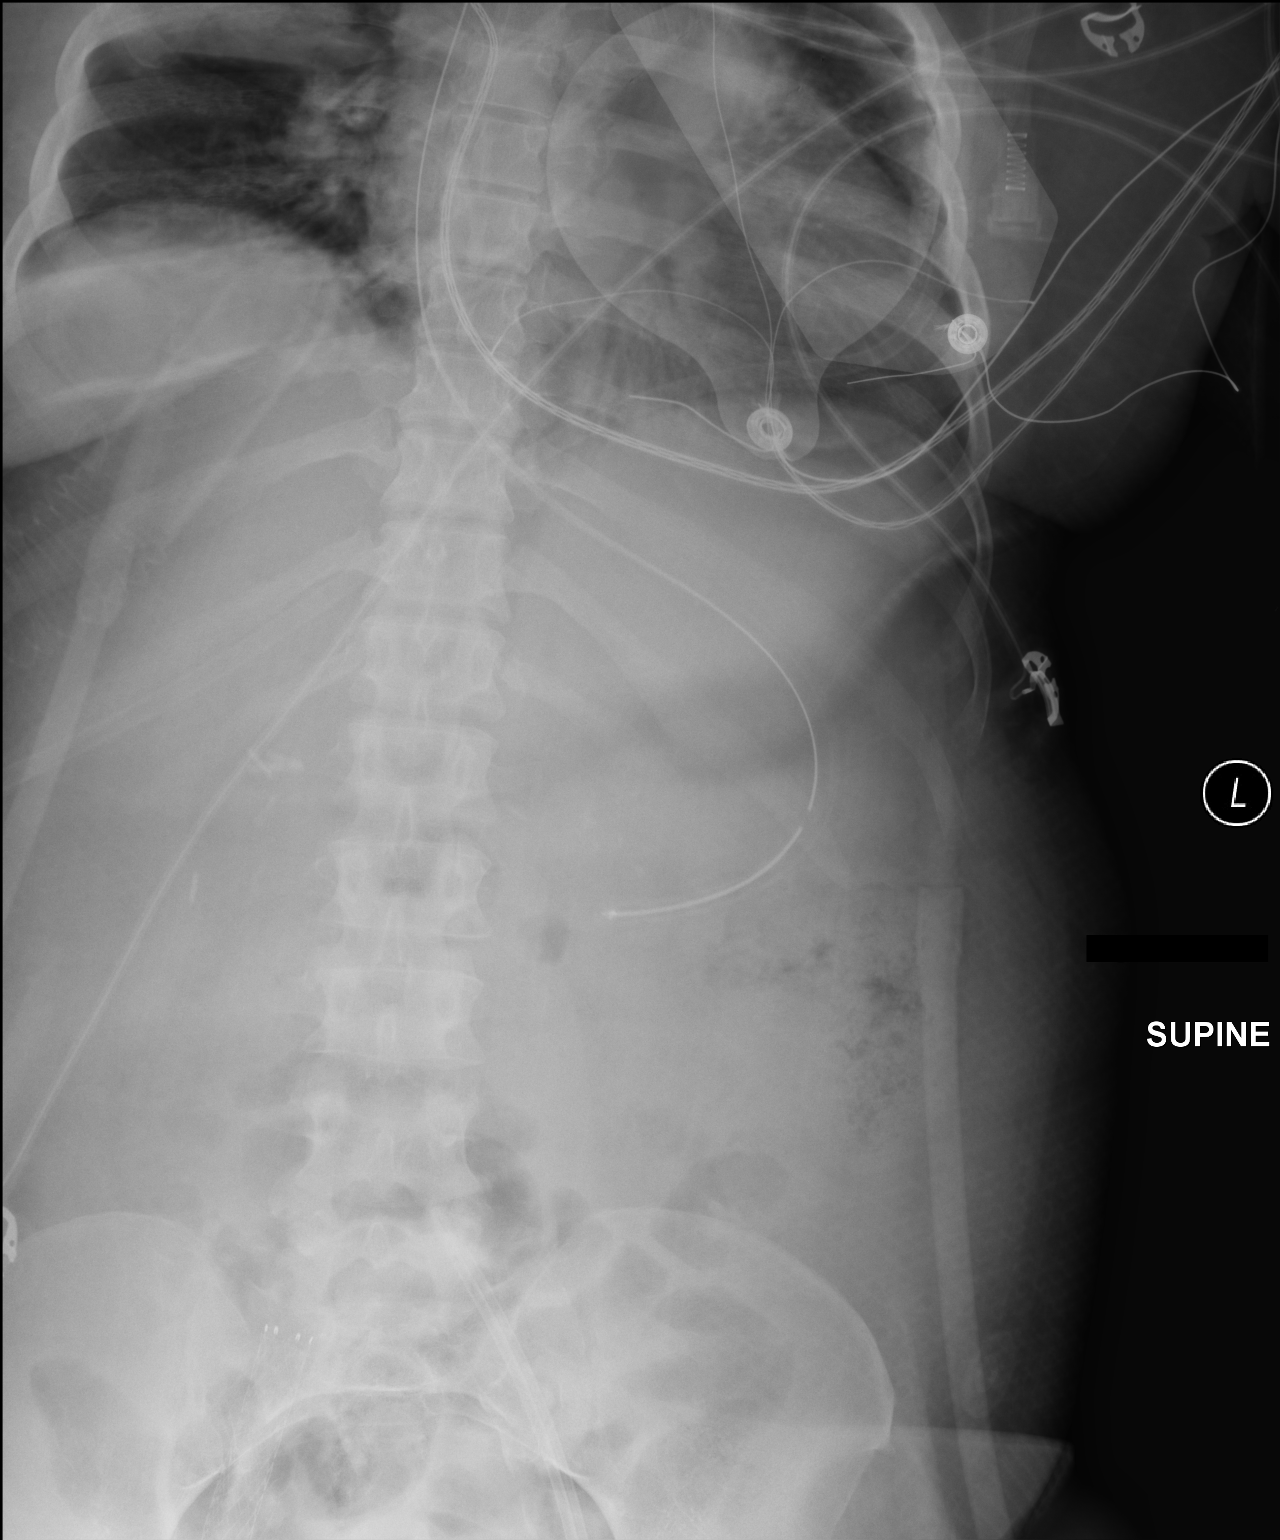

[1 of 1 positions shown; findings below may reference images not displayed]

FINDINGS: Nasogastric tube crosses the gastroesophageal junction and follows
the course that suggests its tip is within the body of the stomach
along the greater curvature.
IMPRESSION: NG tube as described
